# Patient Record
Sex: Female | Born: 1940 | Race: White | Hispanic: No | Marital: Married | Smoking: Former smoker
Health system: Southern US, Community
[De-identification: ages and names within clinical notes are randomized; demographics above are authoritative.]

## PROBLEM LIST (undated history)

## (undated) DIAGNOSIS — E119 Type 2 diabetes mellitus without complications: Secondary | ICD-10-CM

## (undated) DIAGNOSIS — I5020 Unspecified systolic (congestive) heart failure: Secondary | ICD-10-CM

## (undated) DIAGNOSIS — I10 Essential (primary) hypertension: Secondary | ICD-10-CM

## (undated) DIAGNOSIS — M8580 Other specified disorders of bone density and structure, unspecified site: Secondary | ICD-10-CM

## (undated) DIAGNOSIS — R569 Unspecified convulsions: Secondary | ICD-10-CM

## (undated) DIAGNOSIS — H5440 Blindness, one eye, unspecified eye: Secondary | ICD-10-CM

## (undated) DIAGNOSIS — B004 Herpesviral encephalitis: Secondary | ICD-10-CM

## (undated) DIAGNOSIS — H05829 Myopathy of extraocular muscles, unspecified orbit: Secondary | ICD-10-CM

## (undated) DIAGNOSIS — N3021 Other chronic cystitis with hematuria: Secondary | ICD-10-CM

## (undated) DIAGNOSIS — I4891 Unspecified atrial fibrillation: Secondary | ICD-10-CM

## (undated) DIAGNOSIS — S329XXA Fracture of unspecified parts of lumbosacral spine and pelvis, initial encounter for closed fracture: Secondary | ICD-10-CM

## (undated) DIAGNOSIS — H3500 Unspecified background retinopathy: Secondary | ICD-10-CM

## (undated) DIAGNOSIS — K219 Gastro-esophageal reflux disease without esophagitis: Secondary | ICD-10-CM

## (undated) DIAGNOSIS — N390 Urinary tract infection, site not specified: Secondary | ICD-10-CM

## (undated) DIAGNOSIS — E785 Hyperlipidemia, unspecified: Secondary | ICD-10-CM

## (undated) DIAGNOSIS — S72141A Displaced intertrochanteric fracture of right femur, initial encounter for closed fracture: Secondary | ICD-10-CM

## (undated) DIAGNOSIS — R413 Other amnesia: Secondary | ICD-10-CM

## (undated) DIAGNOSIS — N183 Chronic kidney disease, stage 3 (moderate): Secondary | ICD-10-CM

## (undated) DIAGNOSIS — J984 Other disorders of lung: Secondary | ICD-10-CM

## (undated) HISTORY — PX: SHOULDER SURGERY: SHX246

## (undated) HISTORY — DX: Other disorders of lung: J98.4

## (undated) HISTORY — DX: Myopathy of extraocular muscles, unspecified orbit: H05.829

## (undated) HISTORY — DX: Unspecified background retinopathy: H35.00

## (undated) HISTORY — DX: Herpesviral encephalitis: B00.4

## (undated) HISTORY — DX: Other chronic cystitis with hematuria: N30.21

## (undated) HISTORY — DX: Other specified disorders of bone density and structure, unspecified site: M85.80

## (undated) HISTORY — PX: FRACTURE SURGERY: SHX138

## (undated) HISTORY — DX: Urinary tract infection, site not specified: N39.0

## (undated) HISTORY — DX: Gastro-esophageal reflux disease without esophagitis: K21.9

## (undated) HISTORY — DX: Unspecified atrial fibrillation: I48.91

## (undated) HISTORY — DX: Hyperlipidemia, unspecified: E78.5

## (undated) HISTORY — DX: Essential (primary) hypertension: I10

## (undated) HISTORY — DX: Blindness, one eye, unspecified eye: H54.40

## (undated) HISTORY — DX: Type 2 diabetes mellitus without complications: E11.9

---

## 2002-03-02 DIAGNOSIS — M8580 Other specified disorders of bone density and structure, unspecified site: Secondary | ICD-10-CM

## 2002-03-02 HISTORY — DX: Other specified disorders of bone density and structure, unspecified site: M85.80

## 2003-02-08 ENCOUNTER — Encounter: Payer: Self-pay | Admitting: Internal Medicine

## 2004-01-02 ENCOUNTER — Ambulatory Visit: Payer: Self-pay | Admitting: Internal Medicine

## 2004-04-17 ENCOUNTER — Ambulatory Visit: Payer: Self-pay | Admitting: Internal Medicine

## 2004-04-21 ENCOUNTER — Ambulatory Visit: Payer: Self-pay | Admitting: Internal Medicine

## 2004-07-15 ENCOUNTER — Ambulatory Visit: Payer: Self-pay | Admitting: Internal Medicine

## 2004-10-08 ENCOUNTER — Encounter: Admission: RE | Admit: 2004-10-08 | Discharge: 2004-10-08 | Payer: Self-pay | Admitting: Internal Medicine

## 2004-10-08 ENCOUNTER — Ambulatory Visit: Payer: Self-pay | Admitting: Internal Medicine

## 2004-11-14 ENCOUNTER — Ambulatory Visit: Payer: Self-pay | Admitting: Internal Medicine

## 2005-02-24 ENCOUNTER — Ambulatory Visit: Payer: Self-pay | Admitting: Internal Medicine

## 2005-02-24 ENCOUNTER — Ambulatory Visit: Payer: Self-pay | Admitting: Cardiology

## 2005-02-24 ENCOUNTER — Emergency Department (HOSPITAL_COMMUNITY): Admission: EM | Admit: 2005-02-24 | Discharge: 2005-02-24 | Payer: Self-pay | Admitting: *Deleted

## 2005-02-25 ENCOUNTER — Ambulatory Visit: Payer: Self-pay | Admitting: Internal Medicine

## 2005-03-05 ENCOUNTER — Ambulatory Visit: Payer: Self-pay

## 2005-03-10 ENCOUNTER — Inpatient Hospital Stay (HOSPITAL_BASED_OUTPATIENT_CLINIC_OR_DEPARTMENT_OTHER): Admission: RE | Admit: 2005-03-10 | Discharge: 2005-03-10 | Payer: Self-pay | Admitting: Cardiology

## 2005-03-10 ENCOUNTER — Ambulatory Visit: Payer: Self-pay | Admitting: Cardiology

## 2005-03-16 ENCOUNTER — Ambulatory Visit: Payer: Self-pay | Admitting: Internal Medicine

## 2005-03-16 ENCOUNTER — Ambulatory Visit: Payer: Self-pay | Admitting: Cardiology

## 2005-03-16 ENCOUNTER — Inpatient Hospital Stay (HOSPITAL_COMMUNITY): Admission: EM | Admit: 2005-03-16 | Discharge: 2005-03-18 | Payer: Self-pay | Admitting: Emergency Medicine

## 2005-03-20 ENCOUNTER — Ambulatory Visit: Payer: Self-pay

## 2005-03-26 ENCOUNTER — Ambulatory Visit: Payer: Self-pay | Admitting: Cardiology

## 2005-03-26 ENCOUNTER — Ambulatory Visit: Payer: Self-pay | Admitting: Internal Medicine

## 2005-04-02 ENCOUNTER — Ambulatory Visit: Payer: Self-pay | Admitting: Cardiology

## 2005-04-09 ENCOUNTER — Ambulatory Visit: Payer: Self-pay | Admitting: *Deleted

## 2005-04-30 ENCOUNTER — Ambulatory Visit: Payer: Self-pay | Admitting: Internal Medicine

## 2005-05-04 ENCOUNTER — Ambulatory Visit: Payer: Self-pay | Admitting: Internal Medicine

## 2005-05-07 ENCOUNTER — Ambulatory Visit: Payer: Self-pay | Admitting: *Deleted

## 2005-05-07 ENCOUNTER — Inpatient Hospital Stay (HOSPITAL_COMMUNITY): Admission: EM | Admit: 2005-05-07 | Discharge: 2005-05-08 | Payer: Self-pay | Admitting: Emergency Medicine

## 2005-05-08 ENCOUNTER — Encounter: Payer: Self-pay | Admitting: Cardiology

## 2005-05-11 ENCOUNTER — Ambulatory Visit: Payer: Self-pay | Admitting: Internal Medicine

## 2005-05-11 ENCOUNTER — Ambulatory Visit (HOSPITAL_COMMUNITY): Admission: RE | Admit: 2005-05-11 | Discharge: 2005-05-12 | Payer: Self-pay | Admitting: Cardiology

## 2005-05-21 ENCOUNTER — Ambulatory Visit: Payer: Self-pay | Admitting: Cardiology

## 2005-06-04 ENCOUNTER — Ambulatory Visit: Payer: Self-pay

## 2005-06-22 ENCOUNTER — Ambulatory Visit: Payer: Self-pay | Admitting: Internal Medicine

## 2005-06-25 ENCOUNTER — Ambulatory Visit: Payer: Self-pay | Admitting: Internal Medicine

## 2005-08-04 ENCOUNTER — Ambulatory Visit: Payer: Self-pay | Admitting: Internal Medicine

## 2005-08-11 ENCOUNTER — Ambulatory Visit: Payer: Self-pay | Admitting: Cardiology

## 2005-08-26 ENCOUNTER — Ambulatory Visit: Payer: Self-pay | Admitting: Cardiology

## 2005-08-26 ENCOUNTER — Ambulatory Visit: Payer: Self-pay | Admitting: Internal Medicine

## 2005-09-07 ENCOUNTER — Ambulatory Visit: Payer: Self-pay | Admitting: Internal Medicine

## 2005-09-10 ENCOUNTER — Ambulatory Visit: Payer: Self-pay | Admitting: Cardiology

## 2005-09-14 ENCOUNTER — Ambulatory Visit: Payer: Self-pay | Admitting: Internal Medicine

## 2005-09-24 ENCOUNTER — Ambulatory Visit: Payer: Self-pay | Admitting: Internal Medicine

## 2005-10-08 ENCOUNTER — Ambulatory Visit: Payer: Self-pay | Admitting: *Deleted

## 2005-10-29 ENCOUNTER — Ambulatory Visit: Payer: Self-pay | Admitting: Cardiology

## 2005-11-26 ENCOUNTER — Ambulatory Visit: Payer: Self-pay | Admitting: Cardiology

## 2005-12-07 ENCOUNTER — Ambulatory Visit: Payer: Self-pay | Admitting: Internal Medicine

## 2005-12-24 ENCOUNTER — Ambulatory Visit: Payer: Self-pay | Admitting: Cardiology

## 2005-12-25 ENCOUNTER — Ambulatory Visit: Payer: Self-pay | Admitting: Internal Medicine

## 2006-01-20 ENCOUNTER — Ambulatory Visit: Payer: Self-pay | Admitting: Cardiology

## 2006-02-17 ENCOUNTER — Ambulatory Visit: Payer: Self-pay | Admitting: *Deleted

## 2006-03-27 DIAGNOSIS — E785 Hyperlipidemia, unspecified: Secondary | ICD-10-CM

## 2006-03-27 DIAGNOSIS — Z8679 Personal history of other diseases of the circulatory system: Secondary | ICD-10-CM

## 2006-03-27 DIAGNOSIS — G609 Hereditary and idiopathic neuropathy, unspecified: Secondary | ICD-10-CM | POA: Insufficient documentation

## 2006-03-27 DIAGNOSIS — I10 Essential (primary) hypertension: Secondary | ICD-10-CM

## 2006-03-27 DIAGNOSIS — M8580 Other specified disorders of bone density and structure, unspecified site: Secondary | ICD-10-CM

## 2006-03-27 DIAGNOSIS — E119 Type 2 diabetes mellitus without complications: Secondary | ICD-10-CM

## 2006-03-27 DIAGNOSIS — H3500 Unspecified background retinopathy: Secondary | ICD-10-CM | POA: Insufficient documentation

## 2006-03-27 DIAGNOSIS — I4892 Unspecified atrial flutter: Secondary | ICD-10-CM

## 2006-03-27 HISTORY — DX: Essential (primary) hypertension: I10

## 2006-03-27 HISTORY — DX: Unspecified background retinopathy: H35.00

## 2006-03-27 HISTORY — DX: Type 2 diabetes mellitus without complications: E11.9

## 2006-03-27 HISTORY — DX: Hyperlipidemia, unspecified: E78.5

## 2006-04-02 ENCOUNTER — Ambulatory Visit: Payer: Self-pay | Admitting: Cardiology

## 2006-04-21 ENCOUNTER — Ambulatory Visit: Payer: Self-pay | Admitting: Internal Medicine

## 2006-04-30 ENCOUNTER — Ambulatory Visit: Payer: Self-pay | Admitting: Internal Medicine

## 2006-05-03 ENCOUNTER — Ambulatory Visit: Payer: Self-pay | Admitting: Internal Medicine

## 2006-05-03 LAB — CONVERTED CEMR LAB
BUN: 18 mg/dL (ref 6–23)
CO2: 32 meq/L (ref 19–32)
GFR calc Af Amer: 58 mL/min
Potassium: 4.5 meq/L (ref 3.5–5.1)

## 2006-05-05 ENCOUNTER — Ambulatory Visit: Payer: Self-pay | Admitting: *Deleted

## 2006-05-10 ENCOUNTER — Emergency Department (HOSPITAL_COMMUNITY): Admission: EM | Admit: 2006-05-10 | Discharge: 2006-05-11 | Payer: Self-pay | Admitting: Emergency Medicine

## 2006-05-17 ENCOUNTER — Ambulatory Visit: Payer: Self-pay | Admitting: Cardiology

## 2006-05-24 ENCOUNTER — Ambulatory Visit: Payer: Self-pay | Admitting: Internal Medicine

## 2006-05-24 ENCOUNTER — Encounter: Payer: Self-pay | Admitting: Internal Medicine

## 2006-05-24 LAB — CONVERTED CEMR LAB
Creatinine,U: 129.3 mg/dL
Microalb, Ur: 10.5 mg/dL — ABNORMAL HIGH (ref 0.0–1.9)
Total CHOL/HDL Ratio: 2.8
VLDL: 20 mg/dL (ref 0–40)

## 2006-06-28 ENCOUNTER — Ambulatory Visit: Payer: Self-pay | Admitting: Cardiology

## 2006-06-29 ENCOUNTER — Ambulatory Visit: Payer: Self-pay | Admitting: Internal Medicine

## 2006-07-06 ENCOUNTER — Encounter: Payer: Self-pay | Admitting: Internal Medicine

## 2006-07-06 ENCOUNTER — Ambulatory Visit: Payer: Self-pay | Admitting: Thoracic Surgery

## 2006-07-19 ENCOUNTER — Ambulatory Visit: Payer: Self-pay | Admitting: Internal Medicine

## 2006-07-27 ENCOUNTER — Ambulatory Visit: Payer: Self-pay | Admitting: Thoracic Surgery

## 2006-07-27 ENCOUNTER — Encounter: Payer: Self-pay | Admitting: Thoracic Surgery

## 2006-07-27 ENCOUNTER — Ambulatory Visit: Payer: Self-pay | Admitting: Internal Medicine

## 2006-07-27 ENCOUNTER — Inpatient Hospital Stay (HOSPITAL_COMMUNITY): Admission: RE | Admit: 2006-07-27 | Discharge: 2006-08-03 | Payer: Self-pay | Admitting: Thoracic Surgery

## 2006-08-06 ENCOUNTER — Ambulatory Visit: Payer: Self-pay | Admitting: Cardiology

## 2006-08-10 ENCOUNTER — Ambulatory Visit: Payer: Self-pay | Admitting: Thoracic Surgery

## 2006-08-10 ENCOUNTER — Encounter: Admission: RE | Admit: 2006-08-10 | Discharge: 2006-08-10 | Payer: Self-pay | Admitting: Thoracic Surgery

## 2006-08-16 ENCOUNTER — Ambulatory Visit: Payer: Self-pay | Admitting: Cardiology

## 2006-08-31 ENCOUNTER — Encounter: Admission: RE | Admit: 2006-08-31 | Discharge: 2006-08-31 | Payer: Self-pay | Admitting: Thoracic Surgery

## 2006-08-31 ENCOUNTER — Ambulatory Visit: Payer: Self-pay | Admitting: Thoracic Surgery

## 2006-09-01 ENCOUNTER — Ambulatory Visit: Payer: Self-pay | Admitting: Internal Medicine

## 2006-09-01 DIAGNOSIS — J984 Other disorders of lung: Secondary | ICD-10-CM | POA: Insufficient documentation

## 2006-09-01 HISTORY — DX: Other disorders of lung: J98.4

## 2006-09-06 ENCOUNTER — Ambulatory Visit: Payer: Self-pay | Admitting: Cardiovascular Disease

## 2006-09-16 LAB — CONVERTED CEMR LAB
BUN: 16 mg/dL (ref 6–23)
CO2: 31 meq/L (ref 19–32)
GFR calc Af Amer: 58 mL/min
Microalb, Ur: 3.4 mg/dL — ABNORMAL HIGH (ref 0.0–1.9)
Potassium: 5.2 meq/L — ABNORMAL HIGH (ref 3.5–5.1)
Sodium: 140 meq/L (ref 135–145)

## 2006-09-21 ENCOUNTER — Ambulatory Visit: Payer: Self-pay | Admitting: Internal Medicine

## 2006-10-07 ENCOUNTER — Ambulatory Visit: Payer: Self-pay | Admitting: Endocrinology

## 2006-10-22 ENCOUNTER — Ambulatory Visit: Payer: Self-pay | Admitting: Cardiovascular Disease

## 2006-10-22 ENCOUNTER — Ambulatory Visit: Payer: Self-pay | Admitting: Endocrinology

## 2006-11-05 ENCOUNTER — Ambulatory Visit: Payer: Self-pay | Admitting: Internal Medicine

## 2006-11-26 ENCOUNTER — Ambulatory Visit: Payer: Self-pay | Admitting: Cardiovascular Disease

## 2006-12-01 ENCOUNTER — Encounter: Admission: RE | Admit: 2006-12-01 | Discharge: 2006-12-01 | Payer: Self-pay | Admitting: Thoracic Surgery

## 2006-12-01 ENCOUNTER — Ambulatory Visit: Payer: Self-pay | Admitting: Thoracic Surgery

## 2006-12-10 ENCOUNTER — Ambulatory Visit: Payer: Self-pay | Admitting: Cardiology

## 2006-12-31 ENCOUNTER — Ambulatory Visit: Payer: Self-pay | Admitting: Cardiology

## 2007-01-03 ENCOUNTER — Ambulatory Visit: Payer: Self-pay | Admitting: Internal Medicine

## 2007-01-05 LAB — CONVERTED CEMR LAB
Basophils Absolute: 0.1 10*3/uL (ref 0.0–0.1)
Creatinine,U: 63.9 mg/dL
Eosinophils Absolute: 0.2 10*3/uL (ref 0.0–0.6)
Eosinophils Relative: 2.1 % (ref 0.0–5.0)
GFR calc Af Amer: 64 mL/min
GFR calc non Af Amer: 53 mL/min
Glucose, Bld: 82 mg/dL (ref 70–99)
HCT: 37 % (ref 36.0–46.0)
Hemoglobin: 12.5 g/dL (ref 12.0–15.0)
Lymphocytes Relative: 27.4 % (ref 12.0–46.0)
MCHC: 33.8 g/dL (ref 30.0–36.0)
MCV: 91.4 fL (ref 78.0–100.0)
Monocytes Absolute: 0.8 10*3/uL — ABNORMAL HIGH (ref 0.2–0.7)
Neutro Abs: 4.9 10*3/uL (ref 1.4–7.7)
Neutrophils Relative %: 60.5 % (ref 43.0–77.0)
Potassium: 4.5 meq/L (ref 3.5–5.1)
RBC: 4.05 M/uL (ref 3.87–5.11)
Sodium: 140 meq/L (ref 135–145)

## 2007-01-26 ENCOUNTER — Ambulatory Visit: Payer: Self-pay | Admitting: Cardiology

## 2007-02-07 ENCOUNTER — Other Ambulatory Visit: Admission: RE | Admit: 2007-02-07 | Discharge: 2007-02-07 | Payer: Self-pay | Admitting: Gynecology

## 2007-02-07 ENCOUNTER — Encounter: Payer: Self-pay | Admitting: Internal Medicine

## 2007-02-07 LAB — CONVERTED CEMR LAB
TSH: 1.16 microintl units/mL
Vit D, 25-Hydroxy: 46 ng/mL

## 2007-02-09 ENCOUNTER — Encounter: Payer: Self-pay | Admitting: Internal Medicine

## 2007-02-14 ENCOUNTER — Ambulatory Visit: Payer: Self-pay | Admitting: Internal Medicine

## 2007-03-07 ENCOUNTER — Encounter: Payer: Self-pay | Admitting: Internal Medicine

## 2007-03-08 ENCOUNTER — Ambulatory Visit: Payer: Self-pay | Admitting: Internal Medicine

## 2007-03-09 ENCOUNTER — Ambulatory Visit: Payer: Self-pay | Admitting: Cardiovascular Disease

## 2007-03-31 ENCOUNTER — Ambulatory Visit: Payer: Self-pay | Admitting: Cardiology

## 2007-04-14 ENCOUNTER — Ambulatory Visit: Payer: Self-pay | Admitting: Cardiology

## 2007-05-04 ENCOUNTER — Ambulatory Visit: Payer: Self-pay | Admitting: Internal Medicine

## 2007-05-06 ENCOUNTER — Ambulatory Visit: Payer: Self-pay | Admitting: Cardiology

## 2007-05-16 ENCOUNTER — Ambulatory Visit: Payer: Self-pay | Admitting: Endocrinology

## 2007-05-27 ENCOUNTER — Ambulatory Visit: Payer: Self-pay | Admitting: Cardiology

## 2007-06-29 ENCOUNTER — Ambulatory Visit: Payer: Self-pay | Admitting: Cardiology

## 2007-06-29 ENCOUNTER — Ambulatory Visit: Payer: Self-pay | Admitting: Endocrinology

## 2007-07-08 ENCOUNTER — Ambulatory Visit: Payer: Self-pay | Admitting: Cardiology

## 2007-07-29 ENCOUNTER — Ambulatory Visit: Payer: Self-pay | Admitting: Cardiology

## 2007-08-10 ENCOUNTER — Emergency Department (HOSPITAL_COMMUNITY): Admission: EM | Admit: 2007-08-10 | Discharge: 2007-08-10 | Payer: Self-pay | Admitting: Emergency Medicine

## 2007-08-26 ENCOUNTER — Ambulatory Visit: Payer: Self-pay | Admitting: Cardiology

## 2007-08-29 ENCOUNTER — Ambulatory Visit: Payer: Self-pay | Admitting: Internal Medicine

## 2007-09-01 ENCOUNTER — Ambulatory Visit: Payer: Self-pay | Admitting: Internal Medicine

## 2007-09-05 ENCOUNTER — Encounter (INDEPENDENT_AMBULATORY_CARE_PROVIDER_SITE_OTHER): Payer: Self-pay | Admitting: *Deleted

## 2007-09-05 ENCOUNTER — Encounter: Payer: Self-pay | Admitting: Endocrinology

## 2007-09-05 LAB — CONVERTED CEMR LAB
AST: 20 units/L (ref 0–37)
BUN: 20 mg/dL (ref 6–23)
CO2: 30 meq/L (ref 19–32)
Calcium: 9.3 mg/dL (ref 8.4–10.5)
Chloride: 99 meq/L (ref 96–112)
Cholesterol: 132 mg/dL (ref 0–200)
Creatinine, Ser: 1 mg/dL (ref 0.4–1.2)
GFR calc non Af Amer: 59 mL/min
HDL: 37.7 mg/dL — ABNORMAL LOW (ref >39.0)
LDL Cholesterol: 77 mg/dL (ref 0–99)
Total CHOL/HDL Ratio: 3.5
Triglycerides: 88 mg/dL (ref 0–149)

## 2007-09-06 ENCOUNTER — Ambulatory Visit: Payer: Self-pay | Admitting: Endocrinology

## 2007-09-12 ENCOUNTER — Ambulatory Visit: Payer: Self-pay | Admitting: Cardiology

## 2007-10-07 ENCOUNTER — Ambulatory Visit: Payer: Self-pay | Admitting: Endocrinology

## 2007-10-10 ENCOUNTER — Ambulatory Visit: Payer: Self-pay | Admitting: Cardiology

## 2007-10-31 ENCOUNTER — Ambulatory Visit: Payer: Self-pay | Admitting: Cardiology

## 2007-11-01 ENCOUNTER — Ambulatory Visit: Payer: Self-pay | Admitting: Internal Medicine

## 2007-11-08 ENCOUNTER — Ambulatory Visit: Payer: Self-pay | Admitting: Internal Medicine

## 2007-11-08 ENCOUNTER — Encounter (INDEPENDENT_AMBULATORY_CARE_PROVIDER_SITE_OTHER): Payer: Self-pay | Admitting: *Deleted

## 2007-11-08 LAB — CONVERTED CEMR LAB
OCCULT 1: NEGATIVE
OCCULT 2: NEGATIVE

## 2007-11-14 ENCOUNTER — Ambulatory Visit: Payer: Self-pay | Admitting: Cardiology

## 2007-11-15 ENCOUNTER — Encounter: Payer: Self-pay | Admitting: Internal Medicine

## 2007-12-05 ENCOUNTER — Ambulatory Visit: Payer: Self-pay | Admitting: Cardiology

## 2008-01-02 ENCOUNTER — Ambulatory Visit: Payer: Self-pay | Admitting: Cardiovascular Disease

## 2008-01-18 ENCOUNTER — Ambulatory Visit: Payer: Self-pay | Admitting: Endocrinology

## 2008-01-18 LAB — CONVERTED CEMR LAB: Hgb A1c MFr Bld: 8.8 % — ABNORMAL HIGH (ref 4.6–6.0)

## 2008-02-09 ENCOUNTER — Ambulatory Visit: Payer: Self-pay | Admitting: Cardiology

## 2008-03-08 ENCOUNTER — Ambulatory Visit: Payer: Self-pay | Admitting: Cardiovascular Disease

## 2008-04-05 ENCOUNTER — Ambulatory Visit: Payer: Self-pay | Admitting: Internal Medicine

## 2008-05-03 ENCOUNTER — Ambulatory Visit: Payer: Self-pay | Admitting: Cardiovascular Disease

## 2008-05-15 ENCOUNTER — Ambulatory Visit: Payer: Self-pay | Admitting: Endocrinology

## 2008-05-28 ENCOUNTER — Ambulatory Visit: Payer: Self-pay | Admitting: Internal Medicine

## 2008-05-31 ENCOUNTER — Ambulatory Visit: Payer: Self-pay | Admitting: Cardiovascular Disease

## 2008-06-18 ENCOUNTER — Encounter: Payer: Self-pay | Admitting: Internal Medicine

## 2008-06-28 ENCOUNTER — Ambulatory Visit: Payer: Self-pay | Admitting: Internal Medicine

## 2008-07-26 ENCOUNTER — Ambulatory Visit: Payer: Self-pay | Admitting: Cardiovascular Disease

## 2008-07-26 LAB — CONVERTED CEMR LAB: Protime: 19

## 2008-07-31 ENCOUNTER — Encounter: Payer: Self-pay | Admitting: *Deleted

## 2008-08-27 ENCOUNTER — Ambulatory Visit: Payer: Self-pay | Admitting: Cardiology

## 2008-09-05 ENCOUNTER — Encounter: Payer: Self-pay | Admitting: *Deleted

## 2008-09-24 ENCOUNTER — Ambulatory Visit: Payer: Self-pay | Admitting: Cardiology

## 2008-10-01 ENCOUNTER — Ambulatory Visit: Payer: Self-pay | Admitting: Internal Medicine

## 2008-10-02 ENCOUNTER — Ambulatory Visit: Payer: Self-pay | Admitting: Endocrinology

## 2008-10-05 LAB — CONVERTED CEMR LAB
ALT: 14 units/L (ref 0–35)
AST: 19 units/L (ref 0–37)
BUN: 19 mg/dL (ref 6–23)
Basophils Absolute: 0 10*3/uL (ref 0.0–0.1)
Calcium: 9.3 mg/dL (ref 8.4–10.5)
Chloride: 103 meq/L (ref 96–112)
Cholesterol: 140 mg/dL (ref 0–200)
Creatinine, Ser: 1.2 mg/dL (ref 0.4–1.2)
Creatinine,U: 78.9 mg/dL
Eosinophils Absolute: 0.2 10*3/uL (ref 0.0–0.7)
GFR calc non Af Amer: 47.66 mL/min (ref >60)
HDL: 47.8 mg/dL (ref >39.00)
Hemoglobin: 12.8 g/dL (ref 12.0–15.0)
Hgb A1c MFr Bld: 9.2 % — ABNORMAL HIGH (ref 4.6–6.5)
LDL Cholesterol: 76 mg/dL (ref 0–99)
Lymphocytes Relative: 25.5 % (ref 12.0–46.0)
MCHC: 34 g/dL (ref 30.0–36.0)
Neutro Abs: 5.1 10*3/uL (ref 1.4–7.7)
Platelets: 264 10*3/uL (ref 150.0–400.0)
RDW: 12.1 % (ref 11.5–14.6)
Total CHOL/HDL Ratio: 3
Triglycerides: 79 mg/dL (ref 0.0–149.0)
VLDL: 15.8 mg/dL (ref 0.0–40.0)

## 2008-10-22 ENCOUNTER — Ambulatory Visit: Payer: Self-pay | Admitting: Cardiovascular Disease

## 2008-11-19 ENCOUNTER — Ambulatory Visit: Payer: Self-pay | Admitting: Cardiology

## 2008-11-19 LAB — CONVERTED CEMR LAB: POC INR: 3.2

## 2008-11-30 ENCOUNTER — Ambulatory Visit: Payer: Self-pay | Admitting: Internal Medicine

## 2008-12-03 ENCOUNTER — Ambulatory Visit: Payer: Self-pay | Admitting: Internal Medicine

## 2008-12-03 LAB — CONVERTED CEMR LAB: POC INR: 2.7

## 2008-12-25 ENCOUNTER — Ambulatory Visit: Payer: Self-pay | Admitting: Cardiovascular Disease

## 2009-01-21 ENCOUNTER — Ambulatory Visit: Payer: Self-pay | Admitting: Endocrinology

## 2009-01-21 LAB — CONVERTED CEMR LAB
Fructosamine: 350 micromoles/L — ABNORMAL HIGH (ref <285)
Hgb A1c MFr Bld: 9.6 % — ABNORMAL HIGH (ref 4.6–6.5)

## 2009-01-22 ENCOUNTER — Ambulatory Visit: Payer: Self-pay | Admitting: Cardiovascular Disease

## 2009-02-04 ENCOUNTER — Ambulatory Visit: Payer: Self-pay | Admitting: Internal Medicine

## 2009-02-06 ENCOUNTER — Encounter (INDEPENDENT_AMBULATORY_CARE_PROVIDER_SITE_OTHER): Payer: Self-pay | Admitting: *Deleted

## 2009-02-12 ENCOUNTER — Ambulatory Visit: Payer: Self-pay | Admitting: Cardiology

## 2009-03-14 ENCOUNTER — Ambulatory Visit: Payer: Self-pay | Admitting: Internal Medicine

## 2009-04-11 ENCOUNTER — Ambulatory Visit: Payer: Self-pay | Admitting: Cardiology

## 2009-04-11 LAB — CONVERTED CEMR LAB: POC INR: 4.3

## 2009-04-22 ENCOUNTER — Ambulatory Visit: Payer: Self-pay | Admitting: Internal Medicine

## 2009-04-22 ENCOUNTER — Ambulatory Visit: Payer: Self-pay | Admitting: Endocrinology

## 2009-04-22 ENCOUNTER — Encounter (INDEPENDENT_AMBULATORY_CARE_PROVIDER_SITE_OTHER): Payer: Self-pay | Admitting: *Deleted

## 2009-04-22 LAB — CONVERTED CEMR LAB: Fecal Occult Bld: NEGATIVE

## 2009-04-25 ENCOUNTER — Ambulatory Visit: Payer: Self-pay | Admitting: Cardiology

## 2009-04-25 LAB — CONVERTED CEMR LAB: POC INR: 3.2

## 2009-05-07 ENCOUNTER — Ambulatory Visit: Payer: Self-pay | Admitting: Internal Medicine

## 2009-05-07 ENCOUNTER — Ambulatory Visit: Payer: Self-pay | Admitting: Cardiology

## 2009-05-07 DIAGNOSIS — R51 Headache: Secondary | ICD-10-CM

## 2009-05-07 DIAGNOSIS — R519 Headache, unspecified: Secondary | ICD-10-CM | POA: Insufficient documentation

## 2009-05-08 ENCOUNTER — Ambulatory Visit: Payer: Self-pay | Admitting: Cardiovascular Disease

## 2009-05-08 LAB — CONVERTED CEMR LAB: POC INR: 2.2

## 2009-05-10 ENCOUNTER — Telehealth (INDEPENDENT_AMBULATORY_CARE_PROVIDER_SITE_OTHER): Payer: Self-pay | Admitting: *Deleted

## 2009-05-29 ENCOUNTER — Ambulatory Visit: Payer: Self-pay | Admitting: Cardiovascular Disease

## 2009-06-19 ENCOUNTER — Ambulatory Visit: Payer: Self-pay | Admitting: Cardiology

## 2009-07-01 ENCOUNTER — Ambulatory Visit: Payer: Self-pay | Admitting: Internal Medicine

## 2009-07-05 LAB — CONVERTED CEMR LAB
Calcium: 9.2 mg/dL (ref 8.4–10.5)
Creatinine, Ser: 1.2 mg/dL (ref 0.4–1.2)
GFR calc non Af Amer: 47.55 mL/min (ref >60)
Glucose, Bld: 154 mg/dL — ABNORMAL HIGH (ref 70–99)
Sodium: 138 meq/L (ref 135–145)

## 2009-07-09 ENCOUNTER — Ambulatory Visit: Payer: Self-pay | Admitting: Cardiovascular Disease

## 2009-07-09 LAB — CONVERTED CEMR LAB: POC INR: 2.3

## 2009-07-26 ENCOUNTER — Ambulatory Visit: Payer: Self-pay | Admitting: Endocrinology

## 2009-07-30 ENCOUNTER — Ambulatory Visit: Payer: Self-pay | Admitting: Internal Medicine

## 2009-08-27 ENCOUNTER — Ambulatory Visit: Payer: Self-pay | Admitting: Internal Medicine

## 2009-08-27 LAB — CONVERTED CEMR LAB: POC INR: 2.1

## 2009-09-25 ENCOUNTER — Ambulatory Visit: Payer: Self-pay | Admitting: Cardiology

## 2009-09-25 LAB — CONVERTED CEMR LAB: POC INR: 2.2

## 2009-10-08 ENCOUNTER — Ambulatory Visit: Payer: Self-pay | Admitting: Endocrinology

## 2009-10-08 LAB — CONVERTED CEMR LAB: Hgb A1c MFr Bld: 8.8 % — ABNORMAL HIGH (ref 4.6–6.5)

## 2009-10-09 ENCOUNTER — Telehealth: Payer: Self-pay | Admitting: Internal Medicine

## 2009-10-23 ENCOUNTER — Ambulatory Visit: Payer: Self-pay | Admitting: Internal Medicine

## 2009-10-23 LAB — CONVERTED CEMR LAB: POC INR: 2.1

## 2009-11-20 ENCOUNTER — Ambulatory Visit: Payer: Self-pay | Admitting: Cardiology

## 2009-11-20 LAB — CONVERTED CEMR LAB: POC INR: 2.6

## 2009-12-18 ENCOUNTER — Ambulatory Visit: Payer: Self-pay | Admitting: Internal Medicine

## 2009-12-18 LAB — CONVERTED CEMR LAB: POC INR: 2.6

## 2010-01-10 ENCOUNTER — Ambulatory Visit: Payer: Self-pay | Admitting: Cardiology

## 2010-01-30 ENCOUNTER — Ambulatory Visit: Payer: Self-pay | Admitting: Endocrinology

## 2010-01-30 LAB — CONVERTED CEMR LAB: Microalb, Ur: 8.1 mg/dL — ABNORMAL HIGH (ref 0.0–1.9)

## 2010-02-03 ENCOUNTER — Ambulatory Visit: Payer: Self-pay | Admitting: Internal Medicine

## 2010-02-04 ENCOUNTER — Telehealth: Payer: Self-pay | Admitting: Internal Medicine

## 2010-02-05 ENCOUNTER — Encounter (INDEPENDENT_AMBULATORY_CARE_PROVIDER_SITE_OTHER): Payer: Self-pay | Admitting: *Deleted

## 2010-02-07 ENCOUNTER — Ambulatory Visit: Payer: Self-pay | Admitting: Internal Medicine

## 2010-02-07 LAB — CONVERTED CEMR LAB
BUN: 15 mg/dL (ref 6–23)
CO2: 28 meq/L (ref 19–32)
Chloride: 100 meq/L (ref 96–112)
Cholesterol: 162 mg/dL (ref 0–200)
Glucose, Bld: 140 mg/dL — ABNORMAL HIGH (ref 70–99)
HDL: 46.7 mg/dL (ref >39.00)
LDL Cholesterol: 99 mg/dL (ref 0–99)
Potassium: 4.4 meq/L (ref 3.5–5.1)
Sodium: 137 meq/L (ref 135–145)
Total CHOL/HDL Ratio: 3
Triglycerides: 83 mg/dL (ref 0.0–149.0)

## 2010-02-13 ENCOUNTER — Encounter
Admission: RE | Admit: 2010-02-13 | Discharge: 2010-02-13 | Payer: Self-pay | Source: Home / Self Care | Attending: Internal Medicine | Admitting: Internal Medicine

## 2010-02-17 ENCOUNTER — Ambulatory Visit: Payer: Self-pay | Admitting: Internal Medicine

## 2010-03-07 ENCOUNTER — Encounter: Payer: Self-pay | Admitting: Internal Medicine

## 2010-03-07 ENCOUNTER — Ambulatory Visit: Admission: RE | Admit: 2010-03-07 | Discharge: 2010-03-07 | Payer: Self-pay | Source: Home / Self Care

## 2010-03-07 ENCOUNTER — Ambulatory Visit
Admission: RE | Admit: 2010-03-07 | Discharge: 2010-03-07 | Payer: Self-pay | Source: Home / Self Care | Attending: Internal Medicine | Admitting: Internal Medicine

## 2010-03-07 DIAGNOSIS — I4891 Unspecified atrial fibrillation: Secondary | ICD-10-CM | POA: Insufficient documentation

## 2010-03-07 LAB — CONVERTED CEMR LAB: POC INR: 1.7

## 2010-03-30 LAB — CONVERTED CEMR LAB
TSH: 1.04 microintl units/mL (ref 0.35–5.50)
Vit D, 25-Hydroxy: 47 ng/mL (ref 30–89)

## 2010-04-01 NOTE — Assessment & Plan Note (Signed)
Summary: FU--PER PT--D/T---STC   Vital Signs:  Patient profile:   70 year old female Height:      69 inches (175.26 cm) Weight:      234 pounds (106.36 kg) BMI:     34.68 O2 Sat:      95 % on Room air Temp:     97.7 degrees F (36.50 degrees C) oral Pulse rate:   68 / minute BP sitting:   140 / 72  (left arm) Cuff size:   large  Vitals Entered By: Rebeca Alert CMA Deborra Medina) (January 30, 2010 1:04 PM)  O2 Flow:  Room air CC: Follow-up visit/refill on insulin/aj Is Patient Diabetic? Yes   CC:  Follow-up visit/refill on insulin/aj.  History of Present Illness: pt states she feels well in general.  no cbg record, but states cbg was slightly low after lunch, or at 2 am.  she says it is otherwise well-controlled, with no trend throughout the day.    Current Medications (verified): 1)  Avapro 150 Mg  Tabs (Irbesartan) .[name].. 1 By Mouth Once Daily 2)  Diltiazem Hcl 120 Mg  Tabs (Diltiazem Hcl) .[name].. 1 By Mouth Qd 3)  Asa 4)  Mvi 5)  Oscal 500/200 D-3   Tabs (Calcium-Vitamin D Tabs) 6)  Zocor 40 Mg Tabs (Simvastatin) .[name].. 1 By Mouth Daily. 7)  Coumadin 5 Mg Tabs (Warfarin Sodium) .... Take As Directed By Coumadin Clinic. 8)  Vicodin 5-500 Mg Tabs (Hydrocodone-Acetaminophen) .[name].. 1-2 By Mouth Every 6 Hours As Needed Ha 9)  Humulin N 100 Unit/ml Susp (Insulin Isophane Human) .... 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 of COMMON MIGRAINE    Review of Systems  The patient denies syncope.    Physical Exam  General:  obese.  no distress  Pulses:  dorsalis pedis intact bilat.    Extremities:  no deformity.  no ulcer on the feet.  feet are  of normal color and temp.  no edema.    Neurologic:  sensation is intact to touch on the feet.    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    Other Orders: TLB-A1C / Hgb A1C (Glycohemoglobin) (83036-A1C) TLB-Microalbumin/Creat Ratio, Urine (82043-MALB) Est. Patient Level III [id number]  Patient Instructions: 1)  a blood test is being ordered for you today.  a few days after the test(s), please call [phone] to hear your test results.  2)  pending the test results, please continue the same insulin for now 3)  Please schedule a follow-up appointment in 3 months. 4)  . Prescriptions: HUMULIN N 100 UNIT/ML SUSP (INSULIN ISOPHANE HUMAN) 50 units am and 25 units pm  #3 vials x 11   Entered and Authorized by:   Donavan Foil MD   Signed by:   Donavan Foil MD on 01/30/2010   Method used:   Electronically to        [facility] (retail)       [phone]       [facility]  [place]  [address]       Ph: [phone]       Fax: [phone]   [id number]    Orders Added: 1)  TLB-A1C / Hgb A1C (Glycohemoglobin) [83036-A1C] 2)  TLB-Microalbumin/Creat Ratio, Urine [82043-MALB] 3)  Est. Patient Level III [id number]

## 2010-04-01 NOTE — Medication Information (Signed)
Summary: rov/tm  Anticoagulant Therapy  Managed by: Tula Nakayama, RN, BSN Referring MD: Dorris Carnes Supervising MD: Percival Spanish MD, Jeneen Rinks Indication 1: Atrial Fibrillation (ICD-427.31) Lab Used: [facility] Site: [facility] INR POC 3.2 INR RANGE 2 - 3  Dietary changes: no    Health status changes: no    Bleeding/hemorrhagic complications: no    Recent/future hospitalizations: no    Any changes in medication regimen? no    Recent/future dental: no  Any missed doses?: no       Is patient compliant with meds? yes       Allergies: 1)  * Novacaine  Anticoagulation Management History:      The patient is taking warfarin and comes in today for a routine follow up visit.  Positive risk factors for bleeding include an age of 35 years or older and presence of serious comorbidities.  The bleeding index is 'intermediate risk'.  Positive CHADS2 values include History of HTN and History of Diabetes.  Negative CHADS2 values include Age > 33 years old.  The start date was 03/07/2005.  Her last INR was 3.3.  Anticoagulation responsible provider: Percival Spanish MD, Jeneen Rinks.  INR POC: 3.2.  Cuvette Lot#: [id number].  Exp: 06/2010.    Anticoagulation Management Assessment/Plan:      The patient's current anticoagulation dose is Coumadin 5 mg tabs: Take as directed by coumadin clinic..  The target INR is 2 - 3.  The next INR is due 05/08/2009.  Anticoagulation instructions were given to patient.  Results were reviewed/authorized by Tula Nakayama, RN, BSN.  She was notified by Tula Nakayama, RN, BSN.         Prior Anticoagulation Instructions: INR 4.3 Skip today's dose then change dose to 1/2 pill everyday except 1 pill on Mondays and Wednesdays. Recheck in 2 weeks.   Current Anticoagulation Instructions: INR 3.2 Skip today then change dose to 2.5mg s daily except 5mg s on Mondays. Recheck in 2 weeks.

## 2010-04-01 NOTE — Medication Information (Signed)
Summary: rov/cb  Anticoagulant Therapy  Managed by: Tula Nakayama, RN, BSN Referring MD: Dorris Carnes Supervising MD: Verl Blalock MD, Marcello Moores Indication 1: Atrial Fibrillation (ICD-427.31) Lab Used: [facility] Site: [facility] INR POC 2.2 INR RANGE 2 - 3  Dietary changes: no    Health status changes: no    Bleeding/hemorrhagic complications: no    Recent/future hospitalizations: no    Any changes in medication regimen? no    Recent/future dental: no  Any missed doses?: no       Is patient compliant with meds? yes       Allergies: 1)  * Novacaine  Anticoagulation Management History:      The patient is taking warfarin and comes in today for a routine follow up visit.  Positive risk factors for bleeding include an age of 39 years or older and presence of serious comorbidities.  The bleeding index is 'intermediate risk'.  Positive CHADS2 values include History of HTN and History of Diabetes.  Negative CHADS2 values include Age > 3 years old.  The start date was 03/07/2005.  Her last INR was 3.3.  Anticoagulation responsible provider: Verl Blalock MD, Marcello Moores.  INR POC: 2.2.  Cuvette Lot#: [id number].  Exp: 12/2010.    Anticoagulation Management Assessment/Plan:      The patient's current anticoagulation dose is Coumadin 5 mg tabs: Take as directed by coumadin clinic..  The target INR is 2 - 3.  The next INR is due 10/23/2009.  Anticoagulation instructions were given to patient.  Results were reviewed/authorized by Tula Nakayama, RN, BSN.  She was notified by Tula Nakayama, RN, BSN.         Prior Anticoagulation Instructions: INR 2.1. Take 0.5 tablet daily except 1 tablet on Mon, Wed, Fri. Recheck in   Current Anticoagulation Instructions: INR 2.2 Continue 2.5mg s daily except 5mg s on Mondays, Wednesdays and Fridays. Recheck in 4 weeks.

## 2010-04-01 NOTE — Letter (Signed)
Summary: Imaging Options Form/[facility]  Imaging Options Form/[facility] [place]   Imported By: Edmonia James 05/13/2009 08:00:44  _____________________________________________________________________  External Attachment:    Type:   Image     Comment:   External Document

## 2010-04-01 NOTE — Assessment & Plan Note (Signed)
Summary: 3 MTH FU  STC   Vital Signs:  Patient profile:   70 year old female Height:      69 inches (175.26 cm) Weight:      227.25 pounds (103.30 kg) O2 Sat:      97 % on Room air Temp:     97.1 degrees F (36.17 degrees C) oral Pulse rate:   65 / minute BP sitting:   138 / 68  (left arm) Cuff size:   large  Vitals Entered By: Gardenia Phlegm RMA (April 22, 2009 10:12 AM)  O2 Flow:  Room air CC: 3 month follow up/ CF Is Patient Diabetic? Yes   CC:  3 month follow up/ CF.  History of Present Illness: no cbg record, but states cbg's are somtimes low in the afternoon, highest in am (mid-100's).  pt states she feels well in general.    Current Medications (verified): 1)  Avapro 150 Mg  Tabs (Irbesartan) .[name].. 1 By Mouth Once Daily 2)  Diltiazem Hcl 120 Mg  Tabs (Diltiazem Hcl) .[name].. 1 By Mouth Qd 3)  Asa 4)  Mvi 5)  Oscal 500/200 D-3   Tabs (Calcium-Vitamin D Tabs) 6)  Simvastatin 80 Mg  Tabs (Simvastatin) .[name].. 1 By Mouth At Bedtime 7)  Coumadin 5 Mg Tabs (Warfarin Sodium) .... Take As Directed By Coumadin Clinic. 8)  Humulin 70/30 70-30 % Susp (Insulin Isophane & Regular) .... 48 Units Am and 18 Units Pm  Allergies (verified): 1)  * Novacaine  Past History:  Past Medical History: Last updated: 02/04/2009 DIABETES MELLITUS, w/ NEUROPATHY  RETINOPATHY from laser surgery  HYPERLIPIDEMIA  HYPERTENSION   ATRIAL FLUTTER, PAROXYSMAL  used to see Dr Lovena Le  LUNG NODULE EXCISION 2008, bX BENIGN  OSTEOPENIA?, DEXA 2004 showed osteopenia, DEXA  03-2007 normal ? of COMMON MIGRAINE    Review of Systems  The patient denies syncope.    Physical Exam  General:  obese.  no distress  Neck:  Supple without thyroid enlargement or tenderness. No cervical lymphadenopathy Additional Exam:  Hemoglobin A1C       [H]  8.9 %    Impression & Recommendations:  Problem # 1:  DIABETES MELLITUS, TYPE II (ICD-250.00) needs increased rx. therapy limited by pt's request for least  expensive meds  Medications Added to Medication List This Visit: 1)  Humulin 70/30 70-30 % Susp (Insulin isophane & regular) .... 46 units am and 20 units pm  Other Orders: TLB-A1C / Hgb A1C (Glycohemoglobin) (83036-A1C) Est. Patient Level III [id number]  Patient Instructions: 1)  pending the test results, change humulin 70/30, to 46 units am and 20 units pm. 2)  tests are being ordered for you today.  a few days after the test(s), please call [phone] to hear your test results.  3)  Please schedule a follow-up appointment in 3 months. 4)  (update: i left message on phone-tree:  rx as we discussed.  ret in 1 month, not 3)

## 2010-04-01 NOTE — Progress Notes (Signed)
Summary: decrease simvastatin  Phone Note Outgoing Call   Summary of Call: -I go to a flag from the Coumadin clinic alerting me  about the combination of verapamil and  high dose of simvastatin. they recommend a simvastatin dose of 10 mg -she has been on such combination for a while and she definitely needs statins - plan: call patient please decrease the dose of simvastatin to half , 40 mg daily Phuc Kluttz E. Donell Sliwinski MD  October 09, 2009 9:43 AM   Follow-up for Phone Call        I spoke with pt and she is aware of medication dosage change. Lang[place]  October 09, 2009 9:54 AM     New/Updated Medications: ZOCOR 40 MG TABS (SIMVASTATIN) 1 by mouth daily.

## 2010-04-01 NOTE — Medication Information (Signed)
Summary: rov/sp  Anticoagulant Therapy  Managed by: Porfirio Oar, PharmD Referring MD: Dorris Carnes Supervising MD: Olevia Perches MD, Bruce Indication 1: Atrial Fibrillation (ICD-427.31) Lab Used: [facility] Site: [facility] INR POC 2.6 INR RANGE 2 - 3  Dietary changes: no    Health status changes: no    Bleeding/hemorrhagic complications: no    Recent/future hospitalizations: no    Any changes in medication regimen? yes       Details: taking extra aleve for hip pain  Recent/future dental: no  Any missed doses?: no       Is patient compliant with meds? yes       Allergies: 1)  * Novacaine  Anticoagulation Management History:      The patient is taking warfarin and comes in today for a routine follow up visit.  Positive risk factors for bleeding include an age of 70 years or older and presence of serious comorbidities.  The bleeding index is 'intermediate risk'.  Positive CHADS2 values include History of HTN and History of Diabetes.  Negative CHADS2 values include Age > 41 years old.  The start date was 03/07/2005.  Her last INR was 3.3.  Anticoagulation responsible provider: Olevia Perches MD, Darnell Level.  INR POC: 2.6.  Cuvette Lot#: [id number].  Exp: 01/2011.    Anticoagulation Management Assessment/Plan:      The patient's current anticoagulation dose is Coumadin 5 mg tabs: Take as directed by coumadin clinic..  The target INR is 2 - 3.  The next INR is due 12/18/2009.  Anticoagulation instructions were given to patient.  Results were reviewed/authorized by Porfirio Oar, PharmD.  She was notified by Porfirio Oar PharmD.         Prior Anticoagulation Instructions: INR 2.1  Continue same dose of 1/2 tablet every day except 1 tablet on Monday, Wednesday and Friday.  Recheck INR in 4 weeks.   Current Anticoagulation Instructions: INR 2.6  Continue same dose of 1/2 tablet every day except 1 tablet on Monday, Wednesday and Friday.  Recheck INR in 4 weeks.

## 2010-04-01 NOTE — Medication Information (Signed)
Summary: rov/sl   Anticoagulant Therapy  Managed by: Porfirio Oar, PharmD Referring MD: Dorris Carnes Supervising MD: Rayann Heman MD, Jeneen Rinks Indication 1: Atrial Fibrillation (ICD-427.31) Lab Used: [place] Site: [facility] INR POC 2.5 INR RANGE 2 - 3  Dietary changes: no    Health status changes: no    Bleeding/hemorrhagic complications: no    Recent/future hospitalizations: no    Any changes in medication regimen? yes       Details: changed from simvastatin to pravachol  Recent/future dental: no  Any missed doses?: no       Is patient compliant with meds? yes       Allergies: 1)  * Novacaine  Anticoagulation Management History:      The patient is taking warfarin and comes in today for a routine follow up visit.  Positive risk factors for bleeding include an age of 3 years or older and presence of serious comorbidities.  The bleeding index is 'intermediate risk'.  Positive CHADS2 values include History of HTN and History of Diabetes.  Negative CHADS2 values include Age > 63 years old.  The start date was 03/07/2005.  Her last INR was 3.3.  Anticoagulation responsible provider: Allred MD, Jeneen Rinks.  INR POC: 2.5.  Cuvette Lot#: [id number].  Exp: 04/2011.    Anticoagulation Management Assessment/Plan:      The patient's current anticoagulation dose is Coumadin 5 mg tabs: Take as directed by coumadin clinic..  The target INR is 2 - 3.  The next INR is due 03/07/2010.  Anticoagulation instructions were given to patient.  Results were reviewed/authorized by Porfirio Oar, PharmD.  She was notified by Porfirio Oar PharmD.         Prior Anticoagulation Instructions: INR 3.1  Today, Friday, November 11th, take Coumadin 0.5 tab (2.5 mg). Then, resume taking Coumadin 0.5 tab (2.5 mg) on Sun, Tues, Thur, Sat and Coumadin 1 tab (5 mg) on Mon, Wed, Fri. Return to clinic in 4 weeks.   Current Anticoagulation Instructions: INR 2.5  Continue same dose of 1/2 tablet every day except 1 tablet on  Monday, Wednesday and Friday.  Recheck INR in 4 weeks.

## 2010-04-01 NOTE — Medication Information (Signed)
Summary: rov/ewj  Anticoagulant Therapy  Managed by: Porfirio Oar, PharmD Referring MD: Dorris Carnes Supervising MD: Aundra Dubin MD, Kirk Ruths Indication 1: Atrial Fibrillation (ICD-427.31) Lab Used: [place] Site: [facility] INR POC 1.9 INR RANGE 2 - 3  Dietary changes: no    Health status changes: no    Bleeding/hemorrhagic complications: no    Recent/future hospitalizations: no    Any changes in medication regimen? no    Recent/future dental: no  Any missed doses?: no       Is patient compliant with meds? yes       Allergies: 1)  * Novacaine  Anticoagulation Management History:      Positive risk factors for bleeding include an age of 10 years or older and presence of serious comorbidities.  The bleeding index is 'intermediate risk'.  Positive CHADS2 values include History of HTN and History of Diabetes.  Negative CHADS2 values include Age > 52 years old.  The start date was 03/07/2005.  Her last INR was 3.3.  Anticoagulation responsible provider: Aundra Dubin MD, Yoshua Geisinger.  INR POC: 1.9.  Exp: 07/2010.    Anticoagulation Management Assessment/Plan:      The patient's current anticoagulation dose is Coumadin 5 mg tabs: Take as directed by coumadin clinic..  The target INR is 2 - 3.  The next INR is due 07/09/2009.  Anticoagulation instructions were given to patient.  Results were reviewed/authorized by Porfirio Oar, PharmD.  She was notified by Porfirio Oar PharmD.         Prior Anticoagulation Instructions: INR 1.9  Start taking 1 tablet on Mondays and Fridays and 1/2 tablet all other days.  Recheck in 3 weeks.    Current Anticoagulation Instructions: INR 1.9  Increase dose to 1/2 tablet every day except 1 tablet on Monday, Wednesday and Friday

## 2010-04-01 NOTE — Medication Information (Signed)
Summary: rov/tm  Anticoagulant Therapy  Managed by: Porfirio Oar, PharmD Referring MD: Dorris Carnes Supervising MD: Rayann Heman MD, Jeneen Rinks Indication 1: Atrial Fibrillation (ICD-427.31) Lab Used: [facility] Site: [facility] INR POC 2.1 INR RANGE 2 - 3  Dietary changes: no    Health status changes: no    Bleeding/hemorrhagic complications: no    Recent/future hospitalizations: no    Any changes in medication regimen? no    Recent/future dental: no  Any missed doses?: no       Is patient compliant with meds? yes       Allergies: 1)  * Novacaine  Anticoagulation Management History:      The patient is taking warfarin and comes in today for a routine follow up visit.  Positive risk factors for bleeding include an age of 26 years or older and presence of serious comorbidities.  The bleeding index is 'intermediate risk'.  Positive CHADS2 values include History of HTN and History of Diabetes.  Negative CHADS2 values include Age > 106 years old.  The start date was 03/07/2005.  Her last INR was 3.3.  Anticoagulation responsible Arjan Strohm: Allred MD, Jeneen Rinks.  INR POC: 2.1.  Cuvette Lot#: [id number].  Exp: 12/2010.    Anticoagulation Management Assessment/Plan:      The patient's current anticoagulation dose is Coumadin 5 mg tabs: Take as directed by coumadin clinic..  The target INR is 2 - 3.  The next INR is due 11/20/2009.  Anticoagulation instructions were given to patient.  Results were reviewed/authorized by Porfirio Oar, PharmD.  She was notified by Porfirio Oar PharmD.         Prior Anticoagulation Instructions: INR 2.2 Continue 2.5mg s daily except 5mg s on Mondays, Wednesdays and Fridays. Recheck in 4 weeks.   Current Anticoagulation Instructions: INR 2.1  Continue same dose of 1/2 tablet every day except 1 tablet on Monday, Wednesday and Friday.  Recheck INR in 4 weeks.

## 2010-04-01 NOTE — Medication Information (Signed)
Summary: rov/ewj  Anticoagulant Therapy  Managed by: Freddrick March, RN, BSN Referring MD: Dorris Carnes Supervising MD: Johnsie Cancel MD, Collier Salina Indication 1: Atrial Fibrillation (ICD-427.31) Lab Used: [facility] Site: [facility] INR POC 1.9 INR RANGE 2 - 3  Dietary changes: no    Health status changes: no    Bleeding/hemorrhagic complications: no    Recent/future hospitalizations: no    Any changes in medication regimen? no    Recent/future dental: no  Any missed doses?: no       Is patient compliant with meds? yes       Allergies: 1)  * Novacaine  Anticoagulation Management History:      The patient is taking warfarin and comes in today for a routine follow up visit.  Positive risk factors for bleeding include an age of 70 years or older and presence of serious comorbidities.  The bleeding index is 'intermediate risk'.  Positive CHADS2 values include History of HTN and History of Diabetes.  Negative CHADS2 values include Age > 29 years old.  The start date was 03/07/2005.  Her last INR was 3.3.  Anticoagulation responsible provider: Johnsie Cancel MD, Collier Salina.  INR POC: 1.9.  Cuvette Lot#: [id number].  Exp: 07/2010.    Anticoagulation Management Assessment/Plan:      The patient's current anticoagulation dose is Coumadin 5 mg tabs: Take as directed by coumadin clinic..  The target INR is 2 - 3.  The next INR is due 06/19/2009.  Anticoagulation instructions were given to patient.  Results were reviewed/authorized by Freddrick March, RN, BSN.  She was notified by Freddrick March RN.         Prior Anticoagulation Instructions: INR 2.2  Continue on same dosage 1/2 tablet daily except 1 tablet on Mondays.  Recheck in 3 weeks.    Current Anticoagulation Instructions: INR 1.9  Start taking 1 tablet on Mondays and Fridays and 1/2 tablet all other days.  Recheck in 3 weeks.

## 2010-04-01 NOTE — Assessment & Plan Note (Signed)
Summary: followup/kn   Vital Signs:  Patient profile:   70 year old female Weight:      233.13 pounds Pulse rate:   67 / minute Pulse rhythm:   regular BP sitting:   136 / 72  (left arm) Cuff size:   large  Vitals Entered By: [name] CMA (February 03, 2010 9:55 AM) CC: Follow up visit- fasting  Comments declines flu shot  refill vicodin print rx's.    History of Present Illness: Here for Medicare AWV:  1.   Risk factors based on Past M, S, F history: REVIEWED  2.   Physical Activities: active at home, walks daily  ~ 15 to 20 min 3.   Depression/mood: denies problems and no problems noted  4.   Hearing: normal. no problems noted at normal conversation  5.   ADL's: totally independent  6.   Fall Risk: low risk, no recent fall  7.   Home Safety: DOES FEEL SAFE AT HOME  8.   Height, weight, &visual acuity: see VS, vision "not good" from previous laser and cataract                           suergery BUT stable per last specialist eval  9.   Counseling: yes  10.   Labs ordered based on risk factors: YES  11.           Referral Coordination, if needed  12.           Care Plan, see a/p  13.            Cognitive Assessment -- motor skills, memory and cognition seem adequate  in addition, we discussed the following DIABETES-- per Dr Melodye Ped -- on zocor, no myalgias per se  HYPERTENSION  -- ambulatory BPs varies , mostly WNL  ATRIAL FLUTTER, PAROXYSMAL  -- still on coumadin  , does not see cards anymore     Current Medications (verified): 1)  Avapro 150 Mg  Tabs (Irbesartan) .[name].. 1 By Mouth Once Daily 2)  Diltiazem Hcl 120 Mg  Tabs (Diltiazem Hcl) .[name].. 1 By Mouth Qd 3)  Asa 4)  Mvi 5)  Oscal 500/200 D-3   Tabs (Calcium-Vitamin D Tabs) 6)  Zocor 40 Mg Tabs (Simvastatin) .[name].. 1 By Mouth Daily. 7)  Coumadin 5 Mg Tabs (Warfarin Sodium) .... Take As Directed By Coumadin Clinic. 8)  Vicodin 5-500 Mg Tabs (Hydrocodone-Acetaminophen) .[name].. 1-2 By Mouth Every 6  Hours As Needed Ha 9)  Humulin N 100 Unit/ml Susp (Insulin Isophane Human) .... 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 of COMMON MIGRAINE    Past Surgical History: Reviewed history from 11/01/2007 and no changes required. removal of lung mass  Family History: Reviewed history from 05/28/2008 and no changes required. DM-- F and sisters MI-- F at age 49 colon ca--no breast ca--no  Social History: Reviewed history from 10/01/2008 and no changes required. Married 1 son  moved from [place]-- quit in the 80s ETOH-- rarely   Review of Systems CV:  Denies chest pain or discomfort and swelling of feet; occasional  palpitations if CBGs low . Resp:  Denies cough and shortness of breath. GI:  Denies bloody stools, nausea, and vomiting. GU:  Denies discharge; no vag bleed .  Physical Exam  General:  alert, well-developed, and overweight-appearing.   Neck:  normal carotid upstroke.   Lungs:  normal respiratory effort, no intercostal retractions, no accessory muscle use, and normal breath sounds.   Heart:  normal rate, regular rhythm, no murmur, and no gallop.   Abdomen:  soft, non-tender, no distention, no masses, no guarding, and no rigidity.   Extremities:  no pretibial edema bilaterally  Psych:  Cognition and judgment appear intact. Alert and cooperative with normal attention span and concentration. not anxious appearing and not depressed appearing.     Impression & Recommendations:  Problem # 1:  HEALTH SCREENING (ICD-V70.0) Td 2009 declined a flu shot , does not want one  has declined pneumonia shot consistently  explained benefits of shots  including shingles shot   has seen Dr Toney Rakes  before-- patient thinks  she saw him 04-2009, states they did a DEXA, PAP last MM[facility]  ?2 years ago --------will refer for another one   colonoscopy: never had one and is not interested , had  a neg hemocults 9-09, 04-2009 again discussed the benefits of a colonoscopy issued iFOB  diet and exercise discussed  Orders: Radiology Referral (Radiology) Medicare -1st Annual Wellness Visit [phone])  Problem # 2:  HYPERLIPIDEMIA (ICD-272.4) patient is on both diltiazem  and Zocor Recommend change to Lipitor 20 mg d/t interactions   Her updated medication list for this problem includes:    Lipitor 20 Mg Tabs (Atorvastatin calcium) .[name]... 1 by mouth once daily  Labs Reviewed: SGOT: 18 (07/01/2009)   SGPT: 13 (07/01/2009)   HDL:47.80 (10/01/2008), 37.7 (09/01/2007)  LDL:76 (10/01/2008), 77 (09/01/2007)  Chol:140 (10/01/2008), 132 (09/01/2007)  Trig:79.0 (10/01/2008), 88 (09/01/2007)  Orders: TLB-ALT (SGPT) (84460-ALT) TLB-AST (SGOT) (84450-SGOT) TLB-Lipid Panel (80061-LIPID) Specimen Handling (99000)  Problem # 3:  HYPERTENSION (ICD-401.9) at goal  Her updated medication list for this problem includes:    Avapro 150 Mg Tabs (Irbesartan) .[name]... 1 by mouth once daily    Diltiazem Hcl 120 Mg Tabs (Diltiazem hcl) .[name]... 1 by mouth qd  BP today: 136/72 Prior BP: 140/72 (01/30/2010)  Labs Reviewed: K+: 4.4 (07/01/2009) Creat: : 1.2 (07/01/2009)   Chol: 140 (10/01/2008)   HDL: 47.80 (10/01/2008)   LDL: 76 (10/01/2008)   TG: 79.0 (10/01/2008)  Orders: Venipuncture [id number]) TLB-BMP (Basic Metabolic Panel-BMET) (99991111) Specimen Handling (99000)  Problem # 4:  ATRIAL FLUTTER, PAROXYSMAL (ICD-427.32) on coumadin , has not seen cards x years refer to cards: will ask to reasses, continue w/ coumadin??  Her updated medication list for this problem includes:    Diltiazem Hcl 120 Mg Tabs (Diltiazem hcl) .[name]... 1 by mouth qd    Coumadin 5 Mg  Tabs (Warfarin sodium) .[name]... Take as directed by coumadin clinic.  Orders: Cardiology Referral (Cardiology)  Problem # 5:  OSTEOPENIA (ICD-733.90) DEXA showed osteopenia in 2004, repeated DEXA normal 03-2007 reports she got a bone density test earlier this year @ gynecology. We'll try to records  Her updated medication list for this problem includes:    Oscal 500/200 D-3 Tabs (Calcium-vitamin d tabs)      Complete Medication List: 1)  Avapro 150 Mg Tabs (Irbesartan) .[name].. 1 by mouth once daily 2)  Diltiazem Hcl 120 Mg Tabs (Diltiazem hcl) .[name].. 1 by mouth qd 3)  Asa  4)  Mvi  5)  Oscal 500/200 D-3 Tabs (Calcium-vitamin d tabs) 6)  Lipitor 20 Mg Tabs (Atorvastatin calcium) .[name].. 1 by mouth once daily 7)  Coumadin 5 Mg Tabs (Warfarin sodium) .... Take as directed by coumadin clinic. 8)  Vicodin 5-500 Mg Tabs (Hydrocodone-acetaminophen) .[name].. 1-2 by mouth every 6 hours as needed ha 9)  Humulin N 100 Unit/ml Susp (Insulin isophane human) .... 50 units am and 25 units pm  Patient Instructions: 1)  Please schedule a follow-up appointment in 3 months, fasting  Prescriptions: LIPITOR 20 MG TABS (ATORVASTATIN CALCIUM) 1 by mouth once daily  #90 x 1   Entered and Authorized by:   Jacqulyn Bath E. [name] MD   Signed by:   Alda Berthold. [name] MD on 02/03/2010   Method used:   Print then Give to Patient   RxID:   [id number] ZOCOR 40 MG TABS (SIMVASTATIN) 1 by mouth daily.  #90 Each x 2   Entered by:   [name] CMA   Authorized by:   Alda Berthold. [name] MD   Signed by:   Alda Berthold. [name] MD on 02/03/2010   Method used:   Print then Give to Patient   RxID:   [id number] DILTIAZEM HCL 120 MG  TABS (DILTIAZEM HCL) 1 by mouth QD  #90 Each x 2   Entered by:   [name] CMA   Authorized by:   Alda Berthold. [name] MD   Signed by:   Alda Berthold. [name] MD on 02/03/2010   Method used:   Print then Give to Patient   RxID:   [id number] AVAPRO 150 MG  TABS (IRBESARTAN) 1 by mouth once daily  #90 Each x 3   Entered by:    [name] CMA   Authorized by:   Alda Berthold. [name] MD   Signed by:   Alda Berthold. [name] MD on 02/03/2010   Method used:   Print then Give to Patient   RxID:   [id number]    Orders Added: 1)  Venipuncture [id number] 2)  TLB-BMP (Basic Metabolic Panel-BMET) 123456 3)  TLB-ALT (SGPT) [84460-ALT] 4)  TLB-AST (SGOT) [84450-SGOT] 5)  TLB-Lipid Panel [80061-LIPID] 6)  Specimen Handling [99000] 7)  Radiology Referral [Radiology] 8)  Cardiology Referral [Cardiology] 9)  Est. Patient Level III [id number])  Medicare -1st Annual Wellness Visit [id number]

## 2010-04-01 NOTE — Progress Notes (Signed)
Summary: lipitor to expensive  Phone Note Call from Patient Call back at Home Phone [phone]   Caller: Patient Summary of Call: Pt called and states that she has no insurance and the generic Lipitor is over $400. Is there an alternative for her?  Initial call taken by: [name] CMA,  February 04, 2010 4:10 PM  Follow-up for Phone Call        try pravastatin 40 mg 1 by mouth at bedtime  Follow-up by: [facility] E. [name] MD,  February 06, 2010 4:55 PM    New/Updated Medications: PRAVACHOL 40 MG TABS (PRAVASTATIN SODIUM) 1 by mouth at bedtime. Prescriptions: PRAVACHOL 40 MG TABS (PRAVASTATIN SODIUM) 1 by mouth at bedtime.  #30 x 1   Entered by:   [name] CMA   Authorized by:   Alda Berthold. [name] MD   Signed by:   [name] CMA on 02/06/2010   Method used:   Electronically to        [facility] (retail)       [id number]       [place], [place]  [address]       Ph: [phone]       Fax: [phone]   RxID:   [id number]

## 2010-04-01 NOTE — Medication Information (Signed)
Summary: rov/tm  Anticoagulant Therapy  Managed by: Porfirio Oar, PharmD Referring MD: Dorris Carnes Supervising MD: Lovena Le MD, Carleene Overlie Indication 1: Atrial Fibrillation (ICD-427.31) Lab Used: [facility] Site: [facility] INR POC 2.4 INR RANGE 2 - 3  Dietary changes: no    Health status changes: no    Bleeding/hemorrhagic complications: no    Recent/future hospitalizations: no    Any changes in medication regimen? no    Recent/future dental: no  Any missed doses?: no       Is patient compliant with meds? yes       Allergies: 1)  * Novacaine  Anticoagulation Management History:      The patient is taking warfarin and comes in today for a routine follow up visit.  Positive risk factors for bleeding include an age of 55 years or older and presence of serious comorbidities.  The bleeding index is 'intermediate risk'.  Positive CHADS2 values include History of HTN and History of Diabetes.  Negative CHADS2 values include Age > 69 years old.  The start date was 03/07/2005.  Her last INR was 3.3.  Anticoagulation responsible provider: Lovena Le MD, Carleene Overlie.  INR POC: 2.4.  Cuvette Lot#: [id number].  Exp: 10/2010.    Anticoagulation Management Assessment/Plan:      The patient's current anticoagulation dose is Coumadin 5 mg tabs: Take as directed by coumadin clinic..  The target INR is 2 - 3.  The next INR is due 08/27/2009.  Anticoagulation instructions were given to patient.  Results were reviewed/authorized by Porfirio Oar, PharmD.  She was notified by Porfirio Oar PharmD.         Prior Anticoagulation Instructions: INR 2.3 Continue 2.5mg s daily except 5mg s on Mondays, Wednesdays and Fridays. Recheck in 3 weeks.   Current Anticoagulation Instructions: INR 2.4  Continue same dose of 1/2 tablet every day except 1 tablet on Monday, Wednesday and Friday

## 2010-04-01 NOTE — Medication Information (Signed)
Summary: rov/sp  Anticoagulant Therapy  Managed by: Gwynneth Albright, PharmD Referring MD: Dorris Carnes Supervising MD: Haroldine Laws MD, Quillian Quince Indication 1: Atrial Fibrillation (ICD-427.31) Lab Used: [facility] Site: [facility] INR POC 2.1 INR RANGE 2 - 3  Dietary changes: no    Health status changes: no    Bleeding/hemorrhagic complications: yes       Details: Bruising on arm, but improving.  Recent/future hospitalizations: no    Any changes in medication regimen? no    Recent/future dental: no  Any missed doses?: no       Is patient compliant with meds? yes       Allergies: 1)  * Novacaine  Anticoagulation Management History:      The patient is taking warfarin and comes in today for a routine follow up visit.  Positive risk factors for bleeding include an age of 69 years or older and presence of serious comorbidities.  The bleeding index is 'intermediate risk'.  Positive CHADS2 values include History of HTN and History of Diabetes.  Negative CHADS2 values include Age > 95 years old.  The start date was 03/07/2005.  Her last INR was 3.3.  Anticoagulation responsible provider: Janel Beane MD, Quillian Quince.  INR POC: 2.1.  Cuvette Lot#: [id number].  Exp: 10/2010.    Anticoagulation Management Assessment/Plan:      The patient's current anticoagulation dose is Coumadin 5 mg tabs: Take as directed by coumadin clinic..  The target INR is 2 - 3.  The next INR is due 09/25/2009.  Anticoagulation instructions were given to patient.  Results were reviewed/authorized by Gwynneth Albright, PharmD.  She was notified by Gwynneth Albright, PharmD.         Prior Anticoagulation Instructions: INR 2.4  Continue same dose of 1/2 tablet every day except 1 tablet on Monday, Wednesday and Friday   Current Anticoagulation Instructions: INR 2.1. Take 0.5 tablet daily except 1 tablet on Mon, Wed, Fri. Recheck in

## 2010-04-01 NOTE — Medication Information (Signed)
Summary: [name] [name] [facility]  Anticoagulant Therapy  Managed by: Tula Nakayama, RN, BSN Referring MD: Dorris Carnes Supervising MD: Verl Blalock MD, Marcello Moores Indication 1: Atrial Fibrillation (ICD-427.31) Lab Used: [facility] Site: [facility] INR POC 4.3 INR RANGE 2 - 3  Dietary changes: no    Health status changes: no    Bleeding/hemorrhagic complications: no    Recent/future hospitalizations: no    Any changes in medication regimen? no    Recent/future dental: no  Any missed doses?: no       Is patient compliant with meds? yes       Allergies: 1)  * Novacaine  Anticoagulation Management History:      The patient is taking warfarin and comes in today for a routine follow up visit.  Positive risk factors for bleeding include an age of 70 years or older and presence of serious comorbidities.  The bleeding index is 'intermediate risk'.  Positive CHADS2 values include History of HTN and History of Diabetes.  Negative CHADS2 values include Age > 69 years old.  The start date was 03/07/2005.  Her last INR was 3.3.  Anticoagulation responsible provider: Verl Blalock MD, Marcello Moores.  INR POC: 4.3.  Cuvette Lot#: [id number].  Exp: 06/30/2010.    Anticoagulation Management Assessment/Plan:      The patient's current anticoagulation dose is Coumadin 5 mg tabs: Take as directed by coumadin clinic..  The target INR is 2 - 3.  The next INR is due 04/25/2009.  Anticoagulation instructions were given to patient.  Results were reviewed/authorized by Tula Nakayama, RN, BSN.  She was notified by Tula Nakayama, RN, BSN.         Prior Anticoagulation Instructions: INR  3.0  Ok to eat a salad tonight  Coumadin 1 tab = 5mg  Mon, Wed, Fri 1/2 tab = 2.5mg  Tu, Thur, Sat, Sun  Current Anticoagulation Instructions: INR 4.3 Skip today's dose then change dose to 1/2 pill everyday except 1 pill on Mondays and Wednesdays. Recheck in 2 weeks.

## 2010-04-01 NOTE — Assessment & Plan Note (Signed)
Summary: f/u appt/#/cd   Vital Signs:  Patient profile:   70 year old female Height:      69 inches (175.26 cm) Weight:      229.25 pounds (104.20 kg) BMI:     33.98 O2 Sat:      96 % on Room air Temp:     97.0 degrees F (36.11 degrees C) oral Pulse rate:   62 / minute BP sitting:   122 / 64  (left arm) Cuff size:   large  Vitals Entered By: Rebeca Alert MA (October 08, 2009 1:52 PM)  O2 Flow:  Room air CC: F/U appt/aj Is Patient Diabetic? Yes   CC:  F/U appt/aj.  History of Present Illness: no cbg record, but states cbg's are sometimes mildly low at lunch, or at 2 am.  she says otherwise, it is well-controlled.  pt states she feels well in general.    Current Medications (verified): 1)  Avapro 150 Mg  Tabs (Irbesartan) .[name].. 1 By Mouth Once Daily 2)  Diltiazem Hcl 120 Mg  Tabs (Diltiazem Hcl) .[name].. 1 By Mouth Qd 3)  Asa 4)  Mvi 5)  Oscal 500/200 D-3   Tabs (Calcium-Vitamin D Tabs) 6)  Simvastatin 80 Mg  Tabs (Simvastatin) .[name].. 1 By Mouth At Bedtime 7)  Coumadin 5 Mg Tabs (Warfarin Sodium) .... Take As Directed By Coumadin Clinic. 8)  Vicodin 5-500 Mg Tabs (Hydrocodone-Acetaminophen) .[name].. 1-2 By Mouth Every 6 Hours As Needed Ha 9)  Humulin N 100 Unit/ml Susp (Insulin Isophane Human) .... 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 of COMMON MIGRAINE    Review of Systems  The patient denies syncope.    Physical Exam  General:  obese.  no distress  Psych:  Alert and cooperative; normal mood and affect; normal attention span and concentration.   Additional Exam:  Hemoglobin A1C       [H]  8.8 %   Impression &  Recommendations:  Problem # 1:  DIABETES MELLITUS, TYPE II (ICD-250.00) needs increased rx, but i need cbg info in order to safely increase insulin  Other Orders: TLB-A1C / Hgb A1C (Glycohemoglobin) (83036-A1C)  Patient Instructions: 1)  a blood test is being ordered for you today.  a few days after the test(s), please call [phone] to hear your test results.  2)  pending the test results, please continue the same medications for now 3)  Please schedule a follow-up appointment in 3 months. 4)  (update: i left message on phone-tree:  same insulin.  carefully check cbg's to see the pattern of cbg's--especially the lows).

## 2010-04-01 NOTE — Medication Information (Signed)
Summary: rov/sp   Anticoagulant Therapy  Managed by: Gypsy Lore, PharmD Referring MD: Dorris Carnes Supervising MD: Stanford Breed MD, Aaron Edelman Indication 1: Atrial Fibrillation (ICD-427.31) Lab Used: [facility] Site: [facility] INR POC 3.1 INR RANGE 2 - 3  Dietary changes: no    Health status changes: no    Bleeding/hemorrhagic complications: no    Recent/future hospitalizations: no    Any changes in medication regimen? no    Recent/future dental: no  Any missed doses?: no       Is patient compliant with meds? yes       Allergies: 1)  * Novacaine  Anticoagulation Management History:      The patient is taking warfarin and comes in today for a routine follow up visit.  Positive risk factors for bleeding include an age of 69 years or older and presence of serious comorbidities.  The bleeding index is 'intermediate risk'.  Positive CHADS2 values include History of HTN and History of Diabetes.  Negative CHADS2 values include Age > 8 years old.  The start date was 03/07/2005.  Her last INR was 3.3.  Anticoagulation responsible provider: Stanford Breed MD, Aaron Edelman.  INR POC: 3.1.  Cuvette Lot#: [id number].  Exp: 01/2011.    Anticoagulation Management Assessment/Plan:      The patient's current anticoagulation dose is Coumadin 5 mg tabs: Take as directed by coumadin clinic..  The target INR is 2 - 3.  The next INR is due 02/07/2010.  Anticoagulation instructions were given to patient.  Results were reviewed/authorized by Gypsy Lore, PharmD.  She was notified by Gypsy Lore PharmD.         Prior Anticoagulation Instructions: INR 2.6  Continue same dose of 1/2 tablet every day except 1 tablet on Monday, Wednesday and Friday.  Recheck INR in 4 week.   Current Anticoagulation Instructions: INR 3.1  Today, Friday, November 11th, take Coumadin 0.5 tab (2.5 mg). Then, resume taking Coumadin 0.5 tab (2.5 mg) on Sun, Tues, Thur, Sat and Coumadin 1 tab (5 mg) on Mon, Wed, Fri. Return to clinic in 4  weeks.

## 2010-04-01 NOTE — Assessment & Plan Note (Signed)
Summary: ADD TO SCHEDULE PER STACIA///SPH   Vital Signs:  Patient profile:   70 year old female Height:      69 inches Weight:      225.6 pounds Pulse rate:   74 / minute BP sitting:   112 / 64  Vitals Entered By: Dawson Bills (Jul 01, 2009 12:55 PM) CC: rov   History of Present Illness: ROV was seen w/ HA recently, symptoms resolved  DIABETES-- per Dr [name], not yet well controlled  has some neuropathy, not the painful type, wonders if needs neurontin needs several RFs      Current Medications (verified): 1)  Avapro 150 Mg  Tabs (Irbesartan) .[name].. 1 By Mouth Once Daily 2)  Diltiazem Hcl 120 Mg  Tabs (Diltiazem Hcl) .[name].. 1 By Mouth Qd 3)  Asa 4)  Mvi 5)  Oscal 500/200 D-3   Tabs (Calcium-Vitamin D Tabs) 6)  Simvastatin 80 Mg  Tabs (Simvastatin) .[name].. 1 By Mouth At Bedtime 7)  Coumadin 5 Mg Tabs (Warfarin Sodium) .... Take As Directed By Coumadin Clinic. 8)  Humulin 70/30 70-30 % Susp (Insulin Isophane & Regular) .... 46 Units Am and 20 Units Pm 9)  Vicodin 5-500 Mg Tabs (Hydrocodone-Acetaminophen) .[name].. 1-2 By Mouth Every 6 Hours As Needed Ha 10)  Neurontin 100 Mg Caps (Gabapentin) .[name].. 1 By Mouth Two Times A Day, 2 By Mouth Qhs  Allergies (verified): 1)  * Novacaine  Past History:  Past Medical History: DIABETES MELLITUS, w/ NEUROPATHY  RETINOPATHY from laser surgery  HYPERLIPIDEMIA  HYPERTENSION   ATRIAL FLUTTER, PAROXYSMAL  used to see Dr Lovena Le  LUNG NODULE EXCISION 2008, bX BENIGN  OSTEOPENIA?, DEXA 2004 showed osteopenia, DEXA  03-2007 normal ? of COMMON MIGRAINE    Past Surgical History: Reviewed history from 11/01/2007 and no changes required. removal of lung mass  Social History: Reviewed history from 10/01/2008 and no changes required. Married 1 son  moved from [place]-- quit in the 80s ETOH-- rarely   Review of Systems CV:  Denies chest pain or discomfort and swelling of feet; no ambulatory BPs . Resp:  Denies cough and shortness of  breath.  Physical Exam  General:  alert and well-developed.   Lungs:  normal respiratory effort, no intercostal retractions, no accessory muscle use, and normal breath sounds.   Heart:  normal rate, regular rhythm, no murmur, and no gallop.   Extremities:  no lower extremity edema   Impression & Recommendations:  Problem # 1:  HEADACHE (ICD-784.0) seen w/  severe headache 3/11, headache is resolved,  CT was unremarkable Her updated medication list for this problem includes:    Vicodin 5-500 Mg Tabs (Hydrocodone-acetaminophen) .[name]... 1-2 by mouth every 6 hours as needed ha  Problem # 2:  DIABETES MELLITUS, TYPE II (ICD-250.00) per Dr. [name] mild neuropathy on exam, see previous visits, does not have a painful neuropathy.  Will go ahead and discontinue Neurontin per patient request Her updated medication list for this problem includes:    Avapro 150 Mg Tabs (Irbesartan) .[name]... 1 by mouth once daily    Humulin 70/30 70-30 % Susp (Insulin isophane & regular) .Marland KitchenMarland KitchenMarland KitchenMarland Kitchen 46 units am and 20 units pm  Problem # 3:  HYPERLIPIDEMIA (ICD-272.4) at goal, labs  Her updated medication list for this problem includes:    Simvastatin 80 Mg Tabs (Simvastatin) .[name]... 1 by mouth at bedtime  Labs Reviewed: SGOT: 19 (10/01/2008)   SGPT: 14 (10/01/2008)   HDL:47.80 (10/01/2008), 37.7 (09/01/2007)  LDL:76 (  10/01/2008), 77 (09/01/2007)  Chol:140 (10/01/2008), 132 (09/01/2007)  Trig:79.0 (10/01/2008), 88 (09/01/2007)  Orders: TLB-ALT (SGPT) (84460-ALT) TLB-AST (SGOT) (84450-SGOT)  Problem # 4:  HYPERTENSION (ICD-401.9) at goal  Her updated medication list for this problem includes:    Avapro 150 Mg Tabs (Irbesartan) .[name]... 1 by mouth once daily    Diltiazem Hcl 120 Mg Tabs (Diltiazem hcl) .[name]... 1 by mouth qd    BP today: 112/64 Prior BP: 120/80 (05/07/2009)  Labs Reviewed: K+: 4.3 (10/01/2008) Creat: : 1.2 (10/01/2008)   Chol: 140 (10/01/2008)   HDL: 47.80 (10/01/2008)   LDL: 76 (10/01/2008)   TG:  79.0 (10/01/2008)  Orders: Venipuncture [id number]) TLB-BMP (Basic Metabolic Panel-BMET) (99991111)  Complete Medication List: 1)  Avapro 150 Mg Tabs (Irbesartan) .[name].. 1 by mouth once daily 2)  Diltiazem Hcl 120 Mg Tabs (Diltiazem hcl) .[name].. 1 by mouth qd 3)  Asa  4)  Mvi  5)  Oscal 500/200 D-3 Tabs (Calcium-vitamin d tabs) 6)  Simvastatin 80 Mg Tabs (Simvastatin) .[name].. 1 by mouth at bedtime 7)  Coumadin 5 Mg Tabs (Warfarin sodium) .... Take as directed by coumadin clinic. 8)  Humulin 70/30 70-30 % Susp (Insulin isophane & regular) .... 46 units am and 20 units pm 9)  Vicodin 5-500 Mg Tabs (Hydrocodone-acetaminophen) .[name].. 1-2 by mouth every 6 hours as needed ha  Patient Instructions: 1)  Please schedule a follow-up appointment in  6 months  (yearly check up) Prescriptions: NEURONTIN 100 MG CAPS (GABAPENTIN) 1 by mouth two times a day, 2 by mouth QHS  #90 x 3   Entered by:   Dawson Bills   Authorized by:   Alda Berthold. Zooey Schreurs MD   Signed by:   Dawson Bills on 07/01/2009   Method used:   Print then Give to Patient   RxID:   [id number] SIMVASTATIN 80 MG  TABS (SIMVASTATIN) 1 by mouth at bedtime  #90 x 3   Entered by:   Dawson Bills   Authorized by:   Alda Berthold. Shyloh Derosa MD   Signed by:   Dawson Bills on 07/01/2009   Method used:   Print then Give to Patient   RxID:   [id number] DILTIAZEM HCL 120 MG  TABS (DILTIAZEM HCL) 1 by mouth QD  #90 x 3   Entered by:   Dawson Bills   Authorized by:   Alda Berthold. Garlin Batdorf MD   Signed by:   Dawson Bills on 07/01/2009   Method used:   Print then Give to Patient   RxID:   [id number] AVAPRO 150 MG  TABS (IRBESARTAN) 1 by mouth once daily  #90 Each x 3   Entered by:   Dawson Bills   Authorized by:   Alda Berthold. Zander Ingham MD   Signed by:   Dawson Bills on 07/01/2009   Method used:   Print then Give to Patient   RxID:   [id number]

## 2010-04-01 NOTE — Assessment & Plan Note (Signed)
Summary: f/u appt per pt/cd   Vital Signs:  Patient profile:   70 year old female Height:      69 inches (175.26 cm) Weight:      227 pounds (103.18 kg) O2 Sat:      97 % on Room air Temp:     97.5 degrees F (36.39 degrees C) oral Pulse rate:   70 / minute BP sitting:   130 / 70  (left arm) Cuff size:   large  Vitals Entered By: Gardenia Phlegm RMA (Jul 26, 2009 10:30 AM)  O2 Flow:  Room air CC: Follow-up visit/ pt states she no longer takes Vicodin/ CF Is Patient Diabetic? Yes   CC:  Follow-up visit/ pt states she no longer takes Vicodin/ CF.  History of Present Illness: no cbg record, but states cbg's are seldom low (this happens if lunch is delayed.  it is highest before evening meal--occasionally over 200).   she has pain at the right foot, after she wore new shoes at a wedding.  Current Medications (verified): 1)  Avapro 150 Mg  Tabs (Irbesartan) .[name].. 1 By Mouth Once Daily 2)  Diltiazem Hcl 120 Mg  Tabs (Diltiazem Hcl) .[name].. 1 By Mouth Qd 3)  Asa 4)  Mvi 5)  Oscal 500/200 D-3   Tabs (Calcium-Vitamin D Tabs) 6)  Simvastatin 80 Mg  Tabs (Simvastatin) .[name].. 1 By Mouth At Bedtime 7)  Coumadin 5 Mg Tabs (Warfarin Sodium) .... Take As Directed By Coumadin Clinic. 8)  Humulin 70/30 70-30 % Susp (Insulin Isophane & Regular) .... 46 Units Am and 20 Units Pm 9)  Vicodin 5-500 Mg Tabs (Hydrocodone-Acetaminophen) .[name].. 1-2 By Mouth Every 6 Hours As Needed Ha  Allergies (verified): 1)  * Novacaine  Past History:  Past Medical History: Last updated: 07/01/2009 DIABETES MELLITUS, w/ NEUROPATHY  RETINOPATHY from laser surgery  HYPERLIPIDEMIA  HYPERTENSION   ATRIAL FLUTTER, PAROXYSMAL  used to see Dr Lovena Le  LUNG NODULE EXCISION 2008, bX BENIGN  OSTEOPENIA?, DEXA 2004 showed osteopenia, DEXA  03-2007 normal ? of COMMON MIGRAINE    Review of Systems  The patient denies dyspnea on exertion.    Physical Exam  General:  obese.   Pulses:  dorsalis pedis intact bilat.     Extremities:  no deformity.  no ulcer on the feet.  feet are of normal color and temp.  no edema.  there are 2 healing abrasions near the right foot mtp.  Neurologic:  sensation is intact to touch on the feet  Additional Exam:  a1c=8.9   Impression & Recommendations:  Problem # 1:  DIABETES MELLITUS, TYPE II (ICD-250.00) she neds a simpler regimen  Medications Added to Medication List This Visit: 1)  Humulin N 100 Unit/ml Susp (Insulin isophane human) .... 50 units am and 25 units pm  Other Orders: TLB-A1C / Hgb A1C (Glycohemoglobin) (83036-A1C) Est. Patient Level III [id number])  Patient Instructions: 1)  a blood test is being ordered for you today.  a few days after the test(s), please call [phone] to hear your test results.  2)  pending the test results, please continue the same medications for now 3)  Please schedule a follow-up appointment in 3 months. 4)  (update: i left message on phone-tree:  change to nph 50 units am and 25 units pm) Prescriptions: HUMULIN N 100 UNIT/ML SUSP (INSULIN ISOPHANE HUMAN) 50 units am and 25 units pm  #3 vials x 11   Entered and Authorized by:   Donavan Foil  MD   Signed by:   Donavan Foil MD on 07/26/2009   Method used:   Electronically to        [facility] [phone]* (retail)       [address]       [place],   [address]       Ph: [phone]       Fax: [phone]   RxID:   [id number]

## 2010-04-01 NOTE — Medication Information (Signed)
Summary: rov/sp   Anticoagulant Therapy  Managed by: Porfirio Oar, PharmD Referring MD: Dorris Carnes Supervising MD: Caryl Comes MD, Remo Lipps Indication 1: Atrial Fibrillation (ICD-427.31) Lab Used: [facility] Site: [facility] INR POC 2.6 INR RANGE 2 - 3  Dietary changes: no    Health status changes: no    Bleeding/hemorrhagic complications: no    Recent/future hospitalizations: no    Any changes in medication regimen? no    Recent/future dental: no  Any missed doses?: no       Is patient compliant with meds? yes       Allergies: 1)  * Novacaine  Anticoagulation Management History:      The patient is taking warfarin and comes in today for a routine follow up visit.  Positive risk factors for bleeding include an age of 70 years or older and presence of serious comorbidities.  The bleeding index is 'intermediate risk'.  Positive CHADS2 values include History of HTN and History of Diabetes.  Negative CHADS2 values include Age > 22 years old.  The start date was 03/07/2005.  Her last INR was 3.3.  Anticoagulation responsible Lucillie Kiesel: Caryl Comes MD, Remo Lipps.  INR POC: 2.6.  Exp: 01/2011.    Anticoagulation Management Assessment/Plan:      The patient's current anticoagulation dose is Coumadin 5 mg tabs: Take as directed by coumadin clinic..  The target INR is 2 - 3.  The next INR is due 01/10/2010.  Anticoagulation instructions were given to patient.  Results were reviewed/authorized by Porfirio Oar, PharmD.  She was notified by Porfirio Oar PharmD.         Prior Anticoagulation Instructions: INR 2.6  Continue same dose of 1/2 tablet every day except 1 tablet on Monday, Wednesday and Friday.  Recheck INR in 4 weeks.   Current Anticoagulation Instructions: INR 2.6  Continue same dose of 1/2 tablet every day except 1 tablet on Monday, Wednesday and Friday.  Recheck INR in 4 week.

## 2010-04-01 NOTE — Medication Information (Signed)
Summary: rov/sp  Anticoagulant Therapy  Managed by: Tula Nakayama, RN, BSN Referring MD: Dorris Carnes Supervising MD: Johnsie Cancel MD, Collier Salina Indication 1: Atrial Fibrillation (ICD-427.31) Lab Used: [facility] Site: [facility] INR POC 2.3 INR RANGE 2 - 3  Dietary changes: no    Health status changes: no    Bleeding/hemorrhagic complications: no    Recent/future hospitalizations: no    Any changes in medication regimen? no    Recent/future dental: no  Any missed doses?: no       Is patient compliant with meds? yes       Allergies: 1)  * Novacaine  Anticoagulation Management History:      The patient is taking warfarin and comes in today for a routine follow up visit.  Positive risk factors for bleeding include an age of 70 years or older and presence of serious comorbidities.  The bleeding index is 'intermediate risk'.  Positive CHADS2 values include History of HTN and History of Diabetes.  Negative CHADS2 values include Age > 33 years old.  The start date was 03/07/2005.  Her last INR was 3.3.  Anticoagulation responsible provider: Johnsie Cancel MD, Collier Salina.  INR POC: 2.3.  Cuvette Lot#: [id number].  Exp: 10/2010.    Anticoagulation Management Assessment/Plan:      The patient's current anticoagulation dose is Coumadin 5 mg tabs: Take as directed by coumadin clinic..  The target INR is 2 - 3.  The next INR is due 07/30/2009.  Anticoagulation instructions were given to patient.  Results were reviewed/authorized by Tula Nakayama, RN, BSN.  She was notified by Tula Nakayama, RN, BSN.         Prior Anticoagulation Instructions: INR 1.9  Increase dose to 1/2 tablet every day except 1 tablet on Monday, Wednesday and Friday   Current Anticoagulation Instructions: INR 2.3 Continue 2.5mg s daily except 5mg s on Mondays, Wednesdays and Fridays. Recheck in 3 weeks.

## 2010-04-01 NOTE — Progress Notes (Signed)
Summary: check on pt   ---- Converted from flag ---- ---- 05/08/2009 2:28 PM, Malachi Bonds wrote:   ---- 05/07/2009 4:43 PM, Dawson Bills wrote: dr Larose Kells wants [place] to check on this pt in 2-3 days, can you call her thurs or fri ------------------------------  Phone Note Outgoing Call   Call placed by: Malachi Bonds,  May 10, 2009 8:54 AM Call placed to: Patient Summary of Call: spoke w/ patient says she is doing fine and will call if any problems or concerns........Marland KitchenMalachi Bonds  May 10, 2009 8:54 AM

## 2010-04-01 NOTE — Letter (Signed)
Summary: Results Follow up Letter  [name]   [place], [place] [address]   Phone: [phone]  Fax: [phone]    04/22/2009 MRN: [id number]  [facility] [id number] [place], [place]  [address]  Dear Ms. Jump,  The following are the results of your recent test(s):  Test         Result    Pap Smear:        Normal _____  Not Normal _____ Comments: ______________________________________________________ Cholesterol: LDL(Bad cholesterol):         Your goal is less than:         HDL (Good cholesterol):       Your goal is more than: Comments:  ______________________________________________________ Mammogram:        Normal _____  Not Normal _____ Comments:  ___________________________________________________________________ Hemoccult:        Normal ___X__  Not normal _______ Comments:    ___________________STOOL TEST WAS NEGATIVE FOR BLOOD __________________________________________________ Other Tests:    We routinely do not discuss normal results over the telephone.  If you desire a copy of the results, or you have any questions about this information we can discuss them at your next office visit.   Sincerely,

## 2010-04-01 NOTE — Letter (Signed)
Summary: [facility]: Immunoassay Fecal Occult Blood (iFOB) Order Form  [facility]   [place], [place] [address]   Phone: [phone]  Fax: [phone]      [facility]: Immunoassay Fecal Occult Blood (iFOB) Order Form   February 05, 2010 MRN: [id number]   [name] [name] [birth year]   Physicican Name:_____jose paz, md ____________________  Diagnosis Code:___v76.51_______________________      [name] CMA

## 2010-04-01 NOTE — Medication Information (Signed)
Summary: rov/jm  Anticoagulant Therapy  Managed by: Bonnita Nasuti, PharmD, BCPS, CPP Referring MD: Dorris Carnes Supervising MD: Lovena Le MD, Carleene Overlie Indication 1: Atrial Fibrillation (ICD-427.31) Lab Used: [facility] Site: [facility] INR POC 3.0 INR RANGE 2 - 3  Dietary changes: no    Health status changes: no    Bleeding/hemorrhagic complications: no    Recent/future hospitalizations: no    Any changes in medication regimen? no    Recent/future dental: no  Any missed doses?: no       Is patient compliant with meds? yes       Current Medications (verified): 1)  Avapro 150 Mg  Tabs (Irbesartan) .[name].. 1 By Mouth Once Daily 2)  Diltiazem Hcl 120 Mg  Tabs (Diltiazem Hcl) .[name].. 1 By Mouth Qd 3)  Asa 4)  Mvi 5)  Oscal 500/200 D-3   Tabs (Calcium-Vitamin D Tabs) 6)  Simvastatin 80 Mg  Tabs (Simvastatin) .[name].. 1 By Mouth At Bedtime 7)  Coumadin 5 Mg Tabs (Warfarin Sodium) .... Take As Directed By Coumadin Clinic. 8)  Humulin 70/30 70-30 % Susp (Insulin Isophane & Regular) .... 48 Units Am and 18 Units Pm  Allergies: 1)  * Novacaine  Anticoagulation Management History:      The patient is taking warfarin and comes in today for a routine follow up visit.  Positive risk factors for bleeding include an age of 31 years or older and presence of serious comorbidities.  The bleeding index is 'intermediate risk'.  Positive CHADS2 values include History of HTN and History of Diabetes.  Negative CHADS2 values include Age > 58 years old.  The start date was 03/07/2005.  Her last INR was 3.3.  Anticoagulation responsible provider: Lovena Le MD, Carleene Overlie.  INR POC: 3.0.  Cuvette Lot#: [id number].  Exp: 06/30/2010.    Anticoagulation Management Assessment/Plan:      The patient's current anticoagulation dose is Coumadin 5 mg tabs: Take as directed by coumadin clinic..  The target INR is 2 - 3.  The next INR is due 04/11/2009.  Anticoagulation instructions were given to patient.  Results were  reviewed/authorized by Bonnita Nasuti, PharmD, BCPS, CPP.         Prior Anticoagulation Instructions: INR 3.3  DO NOT TAKE TONIGHTS DOSE OF COUMADIN  CONTINUE TO TAKE 1/2 TABLET SUNDAY, TUESDAY, THURSDAY AND SATURDAY. TAKE 1 TABLET ON MONDAY, WEDNESDAY, FRIDAY.  RECHECK IN 4 WEEKS.  Current Anticoagulation Instructions: INR  3.0  Ok to eat a salad tonight  Coumadin 1 tab = 5mg  Mon, Wed, Fri 1/2 tab = 2.5mg  Tu, Thur, Sat, Sun

## 2010-04-01 NOTE — Assessment & Plan Note (Signed)
Summary: dry heaves,headache/kdc   Vital Signs:  Patient profile:   70 year old female Height:      69 inches Weight:      221 pounds Temp:     98.4 degrees F BP sitting:   120 / 80  Vitals Entered By: Dawson Bills (May 07, 2009 1:43 PM) CC: severe HA x 3/5 (worst HA of life), dry heaves, no vomiting, no relief with ASA, tyl, ibuprofen Is Patient Diabetic? Yes Comments FBS this am Hardy  May 07, 2009 1:43 PM    History of Present Illness: developed a headache 4 days ago the pain is steady, located at left eye and radiates to the left head , the right side is uninvolved it has been as intense as 6/10, currently/10 over-the-counter medications did not  help much yesterday she developed dry heaves and some nausea the pain is described as an ache, is not described as burning or tingling in the skin denies a history of previous headaches, this is the worst headache of her life  Current Medications (verified): 1)  Avapro 150 Mg  Tabs (Irbesartan) .[name].. 1 By Mouth Once Daily 2)  Diltiazem Hcl 120 Mg  Tabs (Diltiazem Hcl) .[name].. 1 By Mouth Qd 3)  Asa 4)  Mvi 5)  Oscal 500/200 D-3   Tabs (Calcium-Vitamin D Tabs) 6)  Simvastatin 80 Mg  Tabs (Simvastatin) .[name].. 1 By Mouth At Bedtime 7)  Coumadin 5 Mg Tabs (Warfarin Sodium) .... Take As Directed By Coumadin Clinic. 8)  Humulin 70/30 70-30 % Susp (Insulin Isophane & Regular) .... 46 Units Am and 20 Units Pm 9)  Vicodin 5-500 Mg Tabs (Hydrocodone-Acetaminophen) .[name].. 1-2 By Mouth Every 6 Hours As Needed Ha 10)  Neurontin 100 Mg Caps (Gabapentin) .[name].. 1 By Mouth Two Times A Day, 2 By Mouth Qhs  Allergies (verified): 1)  * Novacaine  Past History:  Past Medical History: Reviewed history from 02/04/2009 and no changes required. DIABETES MELLITUS, w/ NEUROPATHY  RETINOPATHY from laser surgery  HYPERLIPIDEMIA  HYPERTENSION   ATRIAL FLUTTER, PAROXYSMAL  used to see Dr Lovena Le  LUNG NODULE EXCISION 2008, bX BENIGN    OSTEOPENIA?, DEXA 2004 showed osteopenia, DEXA  03-2007 normal ? of COMMON MIGRAINE    Past Surgical History: Reviewed history from 11/01/2007 and no changes required. removal of lung mass  Social History: Reviewed history from 10/01/2008 and no changes required. Married 1 son  moved from [place]-- quit in the 80s ETOH-- rarely   Review of Systems       no fever or sinus congestion no  recent head or neck injury vision is normal, no red  eye, no actual eyeball pain denies slurred speech, motor deficits no rash up until a few hours ago when the husband noted few dots in the L forehead  Physical Exam  General:  alert and well-developed.  no apparent distress Eyes:  EOMI pupils are small but equal and reactive to light right upper eye   lead slightly droopy, not a new finding per patient Neck:  neck is full range of motion Lungs:  normal respiratory effort, no intercostal retractions, no accessory muscle use, and normal breath sounds.   Heart:  normal rate, regular rhythm, no murmur, and no gallop.   Extremities:  no edema Neurologic:  speech clear face symmetric (other than the right eye  lead) extremities strength symmetric gait normal reflexes slightly decreased throughout but symmetric Skin:  at the left forehead  close to the hairline she has 3 one-mm slightly raised "bumps"  Psych:  Oriented X3, not anxious appearing, and not depressed appearing.     Impression & Recommendations:  Problem # 1:  HEADACHE (ICD-784.0) Assessment New new onset of intense headache,  neurological exam is essentially normal DDX migraine, trigeminal neuropathy, shingles (asked patient to monitor her rash and call me if it  get worse) , stroke or  other intracraneal  abnormalities, glaucoma (noting no red eye and symmetric pupils),  etc plan: CT now further advice with results  Orders: Radiology Referral (Radiology)  Her updated medication list for this problem includes:     Vicodin 5-500 Mg Tabs (Hydrocodone-acetaminophen) .[name]... 1-2 by mouth every 6 hours as needed ha  Problem # 2:  addendum CT of the head negative plan: rest, fluids start Vicodin 5 mg one or two every 6 hours as needed for pain,  #40 start Neurontin 100 mg one p.o. b.i.d. and two at bedtime #90 called in 2 -3 days, let us know how she is doing   DISCUSSED WITH PT, RX CALLED Dawson Bills  May 07, 2009 4:42 PM  Complete Medication List: 1)  Avapro 150 Mg Tabs (Irbesartan) .[name].. 1 by mouth once daily 2)  Diltiazem Hcl 120 Mg Tabs (Diltiazem hcl) .[name].. 1 by mouth qd 3)  Asa  4)  Mvi  5)  Oscal 500/200 D-3 Tabs (Calcium-vitamin d tabs) 6)  Simvastatin 80 Mg Tabs (Simvastatin) .[name].. 1 by mouth at bedtime 7)  Coumadin 5 Mg Tabs (Warfarin sodium) .... Take as directed by coumadin clinic. 8)  Humulin 70/30 70-30 % Susp (Insulin isophane & regular) .... 46 units am and 20 units pm 9)  Vicodin 5-500 Mg Tabs (Hydrocodone-acetaminophen) .[name].. 1-2 by mouth every 6 hours as needed ha 10)  Neurontin 100 Mg Caps (Gabapentin) .[name].. 1 by mouth two times a day, 2 by mouth qhs  Prescriptions: NEURONTIN 100 MG CAPS (GABAPENTIN) 1 by mouth two times a day, 2 by mouth QHS  #90 x 0   Entered by:   Dawson Bills   Authorized by:   Alda Berthold. Mirely Pangle MD   Signed by:   Dawson Bills on 05/07/2009   Method used:   Printed then faxed to ...       [facility] [phone]* (retail)       [place], [place]  [address]       Ph: [phone]       Fax: [phone]   RxID:   [id number] VICODIN 5-500 MG TABS (HYDROCODONE-ACETAMINOPHEN) 1-2 by mouth EVERY 6 HOURS as needed HA  #40 x 0   Entered by:   Dawson Bills   Authorized by:   Alda Berthold. Kiarra Kidd MD   Signed by:   Dawson Bills on 05/07/2009   Method used:   Printed then faxed to ...       [facility] [phone]* (retail)       [address]       [place],   [address]       Ph: [phone]        Fax: [phone]   RxID:   [id number]

## 2010-04-01 NOTE — Medication Information (Signed)
Summary: rov/tm  Anticoagulant Therapy  Managed by: Freddrick March, RN, BSN Referring MD: Dorris Carnes Supervising MD: Johnsie Cancel MD, Collier Salina Indication 1: Atrial Fibrillation (ICD-427.31) Lab Used: [facility] Site: [facility] INR POC 2.2 INR RANGE 2 - 3  Dietary changes: no    Health status changes: yes       Details: C/O recent illness nausea  Bleeding/hemorrhagic complications: no    Recent/future hospitalizations: no    Any changes in medication regimen? yes       Details: Started on Gabapentin and taking hydrocodone 5/500 prn  Recent/future dental: no  Any missed doses?: yes     Details: Missed Monday night's dosage 05/06/09.  Is patient compliant with meds? yes       Allergies (verified): 1)  * Novacaine  Anticoagulation Management History:      The patient is taking warfarin and comes in today for a routine follow up visit.  Positive risk factors for bleeding include an age of 69 years or older and presence of serious comorbidities.  The bleeding index is 'intermediate risk'.  Positive CHADS2 values include History of HTN and History of Diabetes.  Negative CHADS2 values include Age > 93 years old.  The start date was 03/07/2005.  Her last INR was 3.3.  Anticoagulation responsible provider: Johnsie Cancel MD, Col[facility] [place].  INR POC: 2.2.  Cuvette Lot#: [id number].  Exp: 07/2010.    Anticoagulation Management Assessment/Plan:      The patient's current anticoagulation dose is Coumadin 5 mg tabs: Take as directed by coumadin clinic..  The target INR is 2 - 3.  The next INR is due 05/29/2009.  Anticoagulation instructions were given to patient.  Results were reviewed/authorized by Freddrick March, RN, BSN.  She was notified by Freddrick March RN.         Prior Anticoagulation Instructions: INR 3.2 Skip today then change dose to 2.5mg s daily except 5mg s on Mondays. Recheck in 2 weeks.   Current Anticoagulation Instructions: INR 2.2  Continue on same dosage 1/2 tablet daily except 1 tablet on  Mondays.  Recheck in 3 weeks.

## 2010-04-03 NOTE — Assessment & Plan Note (Signed)
Summary:  paroxysmal atrial flutter      Allergies Added:   Visit Type:  nep  CC:  no complaints.  History of Present Illness: Mrs. [name] [name] returns today after a long followup from our arrhythmia clinic.  She is a pleasant 70 yo woman with a h/o Atrial flutter s/p ablation, who had a bronchogenic cyst diagnosed several yrs ago and developed atrial fib after surgery.  since then she has done well but has still had occaisional bouts of palpitations lasting minutes at a time.  While she has not had any documented atrial fib, it is presumed that her palpitations are such.  She has not had much in the way of problems with coumadin.  She denies c/p but does have minimal dyspnea with exertion.  Her DM has not been well controlled.  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 of Common Migraine  (ICD-346.10) 11)  Supraventricular Tachycardia, Hx of  (ICD-V12.59) 12)  Health Screening  (ICD-V70.0)  Current Medications (verified): 1)  Avapro 150 Mg  Tabs (Irbesartan) .[name].. 1 By Mouth Once Daily 2)  Diltiazem Hcl 120 Mg  Tabs (Diltiazem Hcl) .[name].. 1 By Mouth Qd 3)  Asa 4)  Mvi 5)  Oscal 500/200 D-3   Tabs (Calcium-Vitamin D Tabs) 6)  Pravachol 40 Mg Tabs (Pravastatin Sodium) .[name].. 1 By Mouth At Bedtime. 7)  Coumadin 5 Mg Tabs (Warfarin Sodium) .... Take As Directed By Coumadin Clinic. 8)  Humulin N 100 Unit/ml Susp (Insulin Isophane Human) .... 50 Units Am and 25 Units Pm  Allergies (verified): 1)  * Novacaine  Past History:  Past Medical History: Last updated: 02/03/2010 DIABETES MELLITUS, w/ NEUROPATHY  RETINOPATHY from laser surgery  HYPERLIPIDEMIA  HYPERTENSION   ATRIAL  FLUTTER, PAROXYSMAL  used to see Dr Lovena Le  LUNG NODULE EXCISION 2008, bX BENIGN  OSTEOPENIA?, DEXA 2004 showed osteopenia, DEXA  03-2007 normal ? of COMMON MIGRAINE    Past Surgical History: Last updated: 11/01/2007 removal of lung mass  Family History: Last updated: 05/28/2008 DM-- F and sisters MI-- F at age 68 colon ca--no breast ca--no  Social History: Last updated: 10/01/2008 Married 1 son  moved from [place]-- quit in the 80s ETOH-- rarely   Risk Factors: Alcohol Use: 0--  Rarely (05/28/2008)  Risk Factors: Smoking Status: never (05/28/2008)  Review of Systems  The patient denies chest pain, syncope, and dyspnea on exertion.    Vital Signs:  Patient profile:   70 year old female Height:      69 inches Weight:      232.75 pounds BMI:     34.50 Pulse rate:   67 / minute BP sitting:   179 / 74  (left arm) Cuff size:   regular  Vitals Entered By: Hansel Feinstein CMA (March 07, 2010 12:58 PM)  Physical Exam  General:  alert, well-developed, and overweight-appearing.   Neck:  normal carotid upstroke.   Chest Wall:  no deformities or breast masses noted Lungs:  Clear bilaterally to auscultation with no wheezes, rales, or rhonchi. Heart:  normal rate, regular rhythm, no murmur, and no gallop.   Abdomen:  soft, non-tender, no distention, no masses, no guarding,  and no rigidity.   Pulses:  dorsalis pedis intact bilat.    Extremities:  no pretibial edema bilaterally  Neurologic:  sensation is intact to touch on the feet.    EKG  Procedure date:  03/07/2010  Findings:      Normal sinus rhythm with rate of:  65.  Impression & Recommendations:  Problem # 1:  ATRIAL FIBRILLATION (ICD-427.31) Her symptoms are well controlled.  She still has some palpitations and I would recommend continueing her coumadin.  Will follow. Her updated medication list for this problem includes:    Coumadin 5 Mg Tabs (Warfarin sodium) .[name]... Take as directed by coumadin  clinic.  Problem # 2:  HYPERTENSION (ICD-401.9) Her blood pressure is elevated in the office today but her husband tells me that at home it is well controlled. A low sodium diet is requested. Her updated medication list for this problem includes:    Avapro 150 Mg Tabs (Irbesartan) .[name]... 1 by mouth once daily    Diltiazem Hcl 120 Mg Tabs (Diltiazem hcl) .[name]... 1 by mouth qd  Problem # 3:  HYPERLIPIDEMIA (B2193296.4) She will continue her statin and I will have her try and lose weight. Her updated medication list for this problem includes:    Pravachol 40 Mg Tabs (Pravastatin sodium) .[name]... 1 by mouth at bedtime.  Patient Instructions: 1)  Your physician wants you to follow-up in:  6 months with Dr. Lovena Le. You will receive a reminder letter in the mail two months in advance. If you don't receive a letter, please call our office to schedule the follow-up appointment. 2)  Your physician recommends that you continue on your current medications as directed. Please refer to the Current Medication list given to you today.

## 2010-04-03 NOTE — Medication Information (Signed)
Summary: rov/sp   Anticoagulant Therapy  Managed by: Javier Glazier, PharmD Referring MD: Dorris Carnes Supervising MD: Harrington Challenger MD,Larena Ohnemus Indication 1: Atrial Fibrillation (ICD-427.31) Lab Used: [facility] Site: [facility] INR POC 1.7 INR RANGE 2 - 3  Dietary changes: no    Health status changes: no    Bleeding/hemorrhagic complications: no    Recent/future hospitalizations: no    Any changes in medication regimen? no    Recent/future dental: no  Any missed doses?: no       Is patient compliant with meds? yes       Allergies: 1)  * Novacaine  Anticoagulation Management History:      The patient is taking warfarin and comes in today for a routine follow up visit.  Positive risk factors for bleeding include an age of 69 years or older and presence of serious comorbidities.  The bleeding index is 'intermediate risk'.  Positive CHADS2 values include History of HTN and History of Diabetes.  Negative CHADS2 values include Age > 30 years old.  The start date was 03/07/2005.  Her last INR was 3.3 and today's INR is 1.7.  Anticoagulation responsible provider: Harrington Challenger MD,Raine Elsass.  INR POC: 1.7.  Cuvette Lot#: [id number].  Exp: 04/2011.    Anticoagulation Management Assessment/Plan:      The patient's current anticoagulation dose is Coumadin 5 mg tabs: Take as directed by coumadin clinic..  The target INR is 2 - 3.  The next INR is due 04/04/2010.  Anticoagulation instructions were given to patient.  Results were reviewed/authorized by Javier Glazier, PharmD.         Prior Anticoagulation Instructions: INR 2.5  Continue same dose of 1/2 tablet every day except 1 tablet on Monday, Wednesday and Friday.  Recheck INR in 4 weeks.   Current Anticoagulation Instructions: INR 1.7 (2-3)  Take 1 tablet tonight (03/07/2010) and 1 tablet tomorrow (03/08/2010) then return to normal dosing schedule.

## 2010-04-04 ENCOUNTER — Encounter (INDEPENDENT_AMBULATORY_CARE_PROVIDER_SITE_OTHER): Payer: Medicare Other

## 2010-04-04 ENCOUNTER — Encounter: Payer: Self-pay | Admitting: Cardiology

## 2010-04-04 ENCOUNTER — Ambulatory Visit: Admit: 2010-04-04 | Payer: Self-pay

## 2010-04-04 DIAGNOSIS — I4891 Unspecified atrial fibrillation: Secondary | ICD-10-CM

## 2010-04-04 DIAGNOSIS — Z7901 Long term (current) use of anticoagulants: Secondary | ICD-10-CM

## 2010-04-09 NOTE — Medication Information (Signed)
Summary: rov/ewj  Anticoagulant Therapy  Managed by: Tula Nakayama, RN, BSN Referring MD: Dorris Carnes Supervising MD: Stanford Breed MD, Aaron Edelman Indication 1: Atrial Fibrillation (ICD-427.31) Lab Used: [facility] Site: [facility] INR POC 3.3 INR RANGE 2 - 3  Dietary changes: no    Health status changes: no    Bleeding/hemorrhagic complications: no    Recent/future hospitalizations: no    Any changes in medication regimen? no    Recent/future dental: no  Any missed doses?: no       Is patient compliant with meds? yes       Allergies: 1)  * Novacaine  Anticoagulation Management History:      The patient is taking warfarin and comes in today for a routine follow up visit.  Positive risk factors for bleeding include an age of 69 years or older and presence of serious comorbidities.  The bleeding index is 'intermediate risk'.  Positive CHADS2 values include History of HTN and History of Diabetes.  Negative CHADS2 values include Age > 60 years old.  The start date was 03/07/2005.  Her last INR was 1.7.  Anticoagulation responsible provider: Stanford Breed MD, Aaron Edelman.  INR POC: 3.3.  Cuvette Lot#: [id number].  Exp: 03/2011.    Anticoagulation Management Assessment/Plan:      The patient's current anticoagulation dose is Coumadin 5 mg tabs: Take as directed by coumadin clinic..  The target INR is 2 - 3.  The next INR is due 05/02/2010.  Anticoagulation instructions were given to patient.  Results were reviewed/authorized by Tula Nakayama, RN, BSN.  She was notified by Tula Nakayama, RN, BSN.         Prior Anticoagulation Instructions: INR 1.7 (2-3)  Take 1 tablet tonight (03/07/2010) and 1 tablet tomorrow (03/08/2010) then return to normal dosing schedule.  Current Anticoagulation Instructions: INR 3.3 Skip today's dose then resume 2.5mg s everyday except 5mg s on Mondays, Wednesdays and Fridays. Recheck in 4 weeks.

## 2010-04-15 DIAGNOSIS — I4891 Unspecified atrial fibrillation: Secondary | ICD-10-CM

## 2010-04-15 DIAGNOSIS — I4892 Unspecified atrial flutter: Secondary | ICD-10-CM

## 2010-04-15 DIAGNOSIS — Z7901 Long term (current) use of anticoagulants: Secondary | ICD-10-CM | POA: Insufficient documentation

## 2010-05-02 ENCOUNTER — Encounter: Payer: Self-pay | Admitting: Cardiovascular Disease

## 2010-05-02 ENCOUNTER — Encounter (INDEPENDENT_AMBULATORY_CARE_PROVIDER_SITE_OTHER): Payer: Medicare Other

## 2010-05-02 DIAGNOSIS — I4891 Unspecified atrial fibrillation: Secondary | ICD-10-CM

## 2010-05-02 DIAGNOSIS — Z7901 Long term (current) use of anticoagulants: Secondary | ICD-10-CM

## 2010-05-02 LAB — CONVERTED CEMR LAB: POC INR: 4

## 2010-05-05 ENCOUNTER — Encounter: Payer: Self-pay | Admitting: Internal Medicine

## 2010-05-05 ENCOUNTER — Ambulatory Visit (INDEPENDENT_AMBULATORY_CARE_PROVIDER_SITE_OTHER): Payer: Medicare Other | Admitting: Internal Medicine

## 2010-05-05 ENCOUNTER — Ambulatory Visit (INDEPENDENT_AMBULATORY_CARE_PROVIDER_SITE_OTHER)
Admission: RE | Admit: 2010-05-05 | Discharge: 2010-05-05 | Disposition: A | Discharge status: Home / Self Care | Payer: Medicare Other | Source: Ambulatory Visit | Attending: Internal Medicine | Admitting: Internal Medicine

## 2010-05-05 ENCOUNTER — Other Ambulatory Visit: Payer: Self-pay | Admitting: Internal Medicine

## 2010-05-05 DIAGNOSIS — E785 Hyperlipidemia, unspecified: Secondary | ICD-10-CM

## 2010-05-05 DIAGNOSIS — E119 Type 2 diabetes mellitus without complications: Secondary | ICD-10-CM

## 2010-05-05 DIAGNOSIS — R51 Headache: Secondary | ICD-10-CM

## 2010-05-05 LAB — LIPID PANEL: HDL: 43.2 mg/dL (ref >39.00)

## 2010-05-05 LAB — ALT: ALT: 18 U/L (ref 0–35)

## 2010-05-05 LAB — AST: AST: 24 U/L (ref 0–37)

## 2010-05-08 NOTE — Medication Information (Signed)
Summary: rov/tm   Anticoagulant Therapy  Managed by: Tula Nakayama, RN, BSN Referring MD: Dorris Carnes Supervising MD: Stanford Breed MD, Aaron Edelman Indication 1: Atrial Fibrillation (ICD-427.31) Lab Used: [facility] Site: [facility] INR POC 4.0 INR RANGE 2 - 3  Dietary changes: no    Health status changes: no    Bleeding/hemorrhagic complications: no    Recent/future hospitalizations: no    Any changes in medication regimen? no    Recent/future dental: no  Any missed doses?: no       Is patient compliant with meds? yes       Allergies: 1)  * Novacaine  Anticoagulation Management History:      Positive risk factors for bleeding include an age of 26 years or older and presence of serious comorbidities.  The bleeding index is 'intermediate risk'.  Positive CHADS2 values include History of HTN and History of Diabetes.  Negative CHADS2 values include Age > 43 years old.  The start date was 03/07/2005.  Her last INR was 1.7.  Anticoagulation responsible provider: Stanford Breed MD, Aaron Edelman.  INR POC: 4.0.  Cuvette Lot#: [id number].  Exp: 03/2011.    Anticoagulation Management Assessment/Plan:      The patient's current anticoagulation dose is Coumadin 5 mg tabs: Take as directed by coumadin clinic..  The target INR is 2 - 3.  The next INR is due 05/23/2010.  Anticoagulation instructions were given to patient.  Results were reviewed/authorized by Tula Nakayama, RN, BSN.         Prior Anticoagulation Instructions: INR 3.3 Skip today's dose then resume 2.5mg s everyday except 5mg s on Mondays, Wednesdays and Fridays. Recheck in 4 weeks.   Current Anticoagulation Instructions: INR 4.0 Hold coumadin for 2 days (Friday and Saturday) then take 1/2 tablet every day except for Wed and Fri take 1 tablet Recheck INR in 3 weeks.

## 2010-05-13 NOTE — Assessment & Plan Note (Signed)
Summary: rto 3 months/cbs   Vital Signs:  Patient profile:   70 year old female Weight:      228.38 pounds Pulse rate:   82 / minute Pulse rhythm:   regular BP sitting:   134 / 80  (left arm) Cuff size:   regular  Vitals Entered By: [name] CMA (May 05, 2010 9:26 AM) CC: 3 month f/u- fasting  Comments c/o being nauseas, vomitting yesterday HA started yesterday- still has [facility] [address]    History of Present Illness: ROV  here for a followup of her cholesterol, she was switched to Lipitor but later on prescribe Pravachol because it was less expensive. Good tolerance and compliance  Also complains of headache, started yesterday, pain is steady , located on the right side of the head an associated with nausea. This is not the worse  headache of her life but is different from the headache she had a year ago.  has decreased somehow with Tylenol Has a ill defined  visual disturbance  "vision is  slightly off " denies diplopia per se.   ROS No fever No diarrhea No sinus  infection Again no diplopia per se When asked, she admits to "neck stiffness" she is however able to move her neck without problems. Denies any dizziness, slurred speech or focal weaknesses  Current Medications (verified): 1)  Avapro 150 Mg  Tabs (Irbesartan) .[name].. 1 By Mouth Once Daily 2)  Diltiazem Hcl 120 Mg  Tabs (Diltiazem Hcl) .[name].. 1 By Mouth Qd 3)  Asa 4)  Mvi 5)  Oscal 500/200 D-3   Tabs (Calcium-Vitamin D Tabs) 6)  Pravachol 40 Mg Tabs (Pravastatin Sodium) .[name].. 1 By Mouth At Bedtime. 7)  Coumadin 5 Mg Tabs (Warfarin Sodium) .... Take As Directed By Coumadin Clinic. 8)  Humulin N 100 Unit/ml Susp (Insulin Isophane Human) .... 50 Units Am and 25 Units Pm  Allergies (verified): 1)  * Novacaine  Past History:  Past Medical History: DIABETES MELLITUS, w/ NEUROPATHY  RETINOPATHY from laser surgery   right eyelid  weakness after cataract surgery  left eye blindness HYPERLIPIDEMIA    HYPERTENSION   ATRIAL FLUTTER, PAROXYSMAL  used to see Dr Lovena Le  LUNG NODULE EXCISION 2008, bX BENIGN  OSTEOPENIA?, DEXA 2004 showed osteopenia, DEXA  03-2007 normal ? of COMMON MIGRAINE    Physical Exam  General:  alert and well-developed.   no apparent distress Neck:  full ROM.   nontender to palpation of the cervical spine Lungs:  normal respiratory effort, no intercostal retractions, no accessory muscle use, and normal breath sounds.   Heart:  normal rate, regular rhythm, no murmur, and no gallop.   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     Impression & Recommendations:  Problem # 1:  HEADACHE (ICD-784.0)  presents with a one-day history of headache and nausea, this is different from her previous headache a year ago. She is on Coumadin and reportedly has some neck stiffness although  the exam is negative. She also has a ill-defined visual disturbance. Plan: CT of the head,  further advice with results Orders: Radiology Referral (Radiology)  Problem # 2:  DIABETES MELLITUS, TYPE II (ICD-250.00)  per Endocrinology Her updated medication list for this problem includes:    Avapro 150 Mg Tabs (Irbesartan) .[name]... 1 by mouth once  daily    Humulin N 100 Unit/ml Susp (Insulin isophane human) .Marland KitchenMarland KitchenMarland KitchenMarland Kitchen 50 units am and 25 units pm  Labs Reviewed: Creat: 1.1 (02/03/2010)    Reviewed HgBA1c results: 8.4 (01/30/2010)  8.8 (10/08/2009)  Problem # 3:  HYPERLIPIDEMIA (ICD-272.4) labs  , we'll see how she's doing on Pravachol Her updated medication list for this problem includes:    Pravachol 40 Mg Tabs (Pravastatin sodium) .[name]... 1 by mouth at bedtime.  Labs Reviewed: SGOT: 21 (02/03/2010)   SGPT: 14 (02/03/2010)   HDL:46.70 (02/03/2010), 47.80 (10/01/2008)  LDL:99 (02/03/2010), 76 (10/01/2008)  Chol:162 (02/03/2010),  140 (10/01/2008)  Trig:83.0 (02/03/2010), 79.0 (10/01/2008)  Orders: Venipuncture [id number]) TLB-ALT (SGPT) (84460-ALT) TLB-AST (SGOT) (84450-SGOT) TLB-Lipid Panel (80061-LIPID) Specimen Handling (99000)  Complete Medication List: 1)  Avapro 150 Mg Tabs (Irbesartan) .[name].. 1 by mouth once daily 2)  Diltiazem Hcl 120 Mg Tabs (Diltiazem hcl) .[name].. 1 by mouth qd 3)  Asa  4)  Mvi  5)  Oscal 500/200 D-3 Tabs (Calcium-vitamin d tabs) 6)  Pravachol 40 Mg Tabs (Pravastatin sodium) .[name].. 1 by mouth at bedtime. 7)  Coumadin 5 Mg Tabs (Warfarin sodium) .... Take as directed by coumadin clinic. 8)  Humulin N 100 Unit/ml Susp (Insulin isophane human) .... 50 units am and 25 units pm  Patient Instructions: 1)   if your headache gets  worse, let me know 2)  Please schedule a follow-up appointment in 4 months .    Orders Added: 1)  Venipuncture [id number] 2)  TLB-ALT (SGPT) [84460-ALT] 3)  TLB-AST (SGOT) [84450-SGOT] 4)  TLB-Lipid Panel [80061-LIPID] 5)  Radiology Referral [Radiology] 6)  Specimen Handling [id number] 7)  Est. Patient Level IV [id number]

## 2010-05-13 NOTE — Letter (Signed)
Summary: M/M [facility]  M/M [facility]   Imported By: Laural Benes 05/08/2010 12:09:17  _____________________________________________________________________  External Attachment:    Type:   Image     Comment:   External Document

## 2010-05-23 ENCOUNTER — Ambulatory Visit (INDEPENDENT_AMBULATORY_CARE_PROVIDER_SITE_OTHER): Payer: Medicare Other | Admitting: *Deleted

## 2010-05-23 DIAGNOSIS — Z7901 Long term (current) use of anticoagulants: Secondary | ICD-10-CM

## 2010-05-23 DIAGNOSIS — I4892 Unspecified atrial flutter: Secondary | ICD-10-CM

## 2010-05-23 DIAGNOSIS — I4891 Unspecified atrial fibrillation: Secondary | ICD-10-CM

## 2010-05-23 LAB — POCT INR: INR: 3.2

## 2010-05-23 NOTE — Patient Instructions (Signed)
INR 3.2 Today, take 1/2 tablet.  Then, resume taking 1/2 tablet (2.5mg ) daily, except take 1 tablet on Mondays and Fridays. Recheck in 4 weeks.

## 2010-05-26 ENCOUNTER — Ambulatory Visit (INDEPENDENT_AMBULATORY_CARE_PROVIDER_SITE_OTHER): Payer: Medicare Other | Admitting: Endocrinology

## 2010-05-26 ENCOUNTER — Other Ambulatory Visit (INDEPENDENT_AMBULATORY_CARE_PROVIDER_SITE_OTHER): Payer: Medicare Other

## 2010-05-26 ENCOUNTER — Encounter: Payer: Self-pay | Admitting: Endocrinology

## 2010-05-26 VITALS — BP 122/74 | HR 66 | Temp 98.1°F | Ht 69.0 in | Wt 231.8 lb

## 2010-05-26 DIAGNOSIS — E119 Type 2 diabetes mellitus without complications: Secondary | ICD-10-CM

## 2010-05-26 LAB — HEMOGLOBIN A1C: Hgb A1c MFr Bld: 9 % — ABNORMAL HIGH (ref 4.6–6.5)

## 2010-05-26 MED ORDER — INSULIN NPH (HUMAN) (ISOPHANE) 100 UNIT/ML ~~LOC~~ SUSP: SUBCUTANEOUS | Status: DC

## 2010-05-26 NOTE — Patient Instructions (Addendum)
blood tests are being ordered for you today.  please call [phone] to hear your test results. pending the test results, please change your insulin to 70 units each morning, and 15 units each evening. Please reuturn here in 4 month

## 2010-05-26 NOTE — Progress Notes (Signed)
  Subjective:    Patient ID: [name] [name], female    DOB: 1941/02/05, 70 y.o.   MRN: [id number]  HPI pt states she feels well in general.  She says the cost of a medication is very important to her.  she brings a record of her cbg's which i have reviewed today.  It varies from the 60's (am) to 200's (afternoon).   Review of Systems No weight change    Objective:   Physical Exam GENERAL: no distress  Obese.   Neck:  Thyroid is not enlarged.         Assessment & Plan:  Dm, therapy limited by pt's request for least expensive meds, and by her need for a simple insulin regimen.

## 2010-06-19 ENCOUNTER — Ambulatory Visit (INDEPENDENT_AMBULATORY_CARE_PROVIDER_SITE_OTHER): Payer: Medicare Other | Admitting: *Deleted

## 2010-06-19 DIAGNOSIS — Z7901 Long term (current) use of anticoagulants: Secondary | ICD-10-CM

## 2010-06-19 DIAGNOSIS — I4892 Unspecified atrial flutter: Secondary | ICD-10-CM

## 2010-06-19 DIAGNOSIS — I4891 Unspecified atrial fibrillation: Secondary | ICD-10-CM

## 2010-07-15 NOTE — Assessment & Plan Note (Signed)
[facility] OFFICE NOTE   NAME:Kazmierczak, CARRIE-ANN INABNIT                    MRN:          [id number]  DATE:03/08/2007                            DOB:          10/27/1940    Ms. Doornbos returns today for follow-up. She is a very pleasant woman  with a history of flutter status post ablation, history of obesity,  history of atrial fibrillation and a recent removal of a large  bronchogenic cyst.  She returns today for follow-up and overall has done  well.  She notes that in the last several months she has had 3 or 4  episodes of a fib typically lasting 5-10 minutes at a time and no  longer.  She had no other specific complaints today.   MEDICATIONS:  1. Vytorin 10/40.  2. Actos 15 a day.  3. Warfarin as directed.  4. Avapro 150 mg daily.  5. Diltiazem 120 mg daily.  6. Insulin 70/30 as directed.   PHYSICAL EXAM:  She is a pleasant, well-appearing, obese, middle-aged  woman in no distress.  Blood pressure was 153/73, the pulse 72 and regular, respirations were  18.  Weight was 219 pounds.  NECK:  Revealed no jugular distention.  LUNGS:  Clear bilaterally to auscultation. No wheezes, rales or rhonchi  are present.  CARDIOVASCULAR:  Regular rate and rhythm with normal S1 and S2.  EXTREMITIES:  Demonstrate no edema.   EKG demonstrates sinus rhythm with PACs.   IMPRESSION:  1. Paroxysmal atrial fibrillation.  2. Chronic Coumadin therapy.  3. History of atrial flutter status post ablation.  4. Obesity.   DISCUSSION:  Overall Ms. Medley is stable.  Her A fib is well  controlled.  We will see her back in the office in 6 months.     Champ Mungo. Lovena Le, MD  Electronically Signed    GWT/MedQ  DD: 03/08/2007  DT: 03/09/2007  Job #: [id number]

## 2010-07-15 NOTE — Assessment & Plan Note (Signed)
[facility] OFFICE NOTE   NAME:[name], [name] THRONE                    MRN:          [id number]  DATE:04/05/2008                            DOB:          09/16/1940    [name] [name] returns today for followup.  She is a very pleasant 70-year-  old woman with a history of paroxysmal atrial fibrillation,  hypertension, and obesity.  Her palpitations and AFib have been very  nicely controlled over the last several months.  She relates that she  fell and broke her shoulder back in May or perhaps June after her dog  resulted in her falling.  Surgery was not required fortunately, and she  has her healed very nicely with very minimal loss and range of motion.  She returns today for followup.  She denies chest pain.  She denies  shortness of breath.  She notes that she has been somewhat sedentary  since the weather has been cold, but prior to that was walking  regularly.   CURRENT MEDICATIONS:  1. Actos 15 a day.  2. Warfarin as directed.  3. Avapro 150 a day.  4. Diltiazem 120 a day.  5. Aspirin 325 a day.  6. Insulin 70/30 as directed.  7. Simvastatin 80 a day.   PHYSICAL EXAMINATION:  GENERAL:  She is a pleasant well-appearing woman  in no acute distress.  VITAL SIGNS:  The blood pressure was 116/70, the pulse was 66 and  regular, the respirations were 18, the weight was 221 pounds, up 2  pounds from our visit last year.  NECK:  No jugular venous distention.  LUNGS:  Clear bilaterally to auscultation.  No wheezes, rales, or  rhonchi are present.  There is no increased work of breathing.  CARDIOVASCULAR:  Regular rate and rhythm.  Normal S1 and S2.  ABDOMEN:  Soft and nontender.  No organomegaly.  EXTREMITIES:  No cyanosis, clubbing, or edema.  Peripheral pulses are 2+  and symmetric.   Her EKG demonstrates sinus rhythm with normal axis and intervals.   IMPRESSION:  1. Symptomatic atrial fibrillation, now  well controlled and only      paroxysmal with a very minimal symptoms.  2. Hypertension.  3. Dyslipidemia.   DISCUSSION:  [name] is stable.  Her blood pressure is controlled.  Her AFib is controlled.  We will continue her current medical therapy.  We will continue her on Coumadin.  I will see her back as needed.     Champ Mungo. Lovena Le, MD  Electronically Signed    GWT/MedQ  DD: 04/05/2008  DT: 04/06/2008  Job #: [id number]

## 2010-07-15 NOTE — Consult Note (Signed)
NAME:  [name] [name], [name] [name]             ACCOUNT NO.:  [id number]   MEDICAL RECORD NO.:  [id number]          PATIENT TYPE:  INP   LOCATION:  [id number]                         FACILITY:  [facility]   PHYSICIAN:  Fay Records, MD, FACCDATE OF BIRTH:  1940/12/26   DATE OF CONSULTATION:  07/30/2006  DATE OF DISCHARGE:                                 CONSULTATION   IDENTIFICATION:  The patient is a 70 year old who we are asked to see  regarding atrial arrhythmias.   HISTORY OF PRESENT ILLNESS:  The patient was admitted on Jul 27, 2006  for resection of a right paratracheal mass and VATs procedure.  She had  a URI in March.  Chest x-ray was abnormal.  CT showing mass and workup  from there.   The patient denies palpitations, no shortness of breath, no chest pain.   MEDICATIONS ON ADMISSION:  1. Include Coumadin.  2. Vytorin 10/40.  3. Actos 45.  4. Metoprolol 50 b.i.d.  5. Avapro 150 a day.  6. Aspirin 325 daily.  7. Insulin.  8. Os-Cal.   HERE IN THE HOSPITAL:  1. Vytorin question 10/20 versus 10/40  2. Actos 45.  3. Coumadin just started.  4. Insulin.  5. Avapro 150.  6. Lopressor initially 50 b.i.d. decreased to 25 b.i.d.  7. Lasix x1.   ALLERGIES:  NOVOCAINE.   PAST MEDICAL HISTORY:  1. Mild CAD, cath in January 2007 with 25% LAD x2 proximally, 30% mid-      circumflex normal, RCA dominant and normal, EF of 60%.  2. Paroxysmal atrial fibrillation.  3. Atrial flutter, status post ablation in March 2007.  4. Retinopathy.  5. Neuropathy.  6. Diabetes x19 years.  7. Hypertension.  8. Obesity.  9. Dyslipidemia.  10.Bronchogenic.   SOCIAL HISTORY:  Remote tobacco, minus EtOH.  Married.   FAMILY HISTORY:  Mother died suddenly at 52.  Father died at 72 of an  MI.   REVIEW OF SYSTEMS:  All systems reviewed negative to the above problem  except as noted above.   PHYSICAL EXAM:  On exam, the patient is currently in no distress sitting  in a chair.  Denies shortness of breath.   Blood pressures today have  been A999333 systolic over 0000000, pulses in the 60s to 80s.  Temperature  is 99.  HEENT:  Normocephalic, atraumatic.  EOMI.  PERRL.  Throat clear.  NECK:  Right IJ.  No bruits.  LUNGS:  Clear to auscultation, status post chest tube removal with  bandage in the right base.  CARDIAC EXAM:  Regular rate and rhythm S1-S2.  No S3-S4 or murmurs.  ABDOMEN:  Supple, nontender.  EXTREMITIES:  2+ pulses, no edema.   A 12-lead EKG sinus bradycardia 50.  Telemetry sinus rhythm with PACs  and atrial bigeminy.  Labs significant for troponin 0.2.  CK 833 with an  MB of 19.1, giving a fraction of 2.3, hemoglobin 9.7, WBC of 10.9.  Normal MCV, MCHC, platelets of 204.  BUN and creatinine of 17 and 1,  potassium of 4.1, albumin 2.7.  Pathology benign cyst.  Chest x-ray:  Moderate  edema on Jul 30, 2006, atelectasis.  Note lipid panel in March  2008 total cholesterol 123, LDL of 59, HDL 44, triglycerides 101.   IMPRESSION:  The patient is a 70 year old woman with history of atrial  fibrillation intermittent, atrial flutter status post ablation, mild  coronary artery disease on chronic Coumadin who is now status post  removal of a mass which is a benign cyst.  Hemodynamically postop has  been good.  Note diastolic blood pressure has been slow.  Systolics have  climbed.  Telemetry with premature atrial contractures, atrial bigeminy.  On exam volume status looks good.  Blood pressure now slightly high,  again systolics.   RECOMMEND:  1. Increasing Lopressor to 25 t.i.d.  Follow blood pressure, heart      rate.  Increase to 50 b.i.d. as tolerated.  2. Continue Avapro.  3. Continue telemetry.  4. Coumadin being resumed.  5. Check BMET.  6. Will continue to follow.  7. Dyslipidemia.  Change Vytorin to 10/40.  CAD stable.      Fay Records, MD, [facility]  Electronically Signed     PVR/MEDQ  D:  07/30/2006  T:  07/31/2006  Job:  [id number]

## 2010-07-15 NOTE — H&P (Signed)
NAME:  [name] [name], [name] [name]             ACCOUNT NO.:  [id number]   MEDICAL RECORD NO.:  [id number]          PATIENT TYPE:  INP   LOCATION:  NA                           FACILITY:  [facility]   PHYSICIAN:  Nicanor Alcon, M.D. DATE OF BIRTH:  Jan 08, 1941   DATE OF ADMISSION:  DATE OF DISCHARGE:                              HISTORY & PHYSICAL   CHIEF COMPLAINT:  Lung mass.   HISTORY OF PRESENT ILLNESS:  This 70 year old patient has a history of  atrial fibrillation, and had an ablation by Dr. Lovena Le.  Has also had  some atrial fibrillation, and she is presently on Coumadin.  She also  has hypertension, diabetes and obesity.  A chest x-ray was obtained  because of some shortness of breath, showed an area of tracheal  bronchitis, but also showed a right peritracheal mass.  CT scan showed a  right peritracheal mass with compression of the distal take-off at the  right main stem bronchus.  This  is thought to be a possible  bronchiogenic cyst, but had increased in size over the last 2 years at  least judging from chest x-rays.  She also has two 5 mm nodules in the  right middle lobe.  The density of the area, I thought this could  possibly be some type of malignant tumor.  She has had no hemoptysis,  fevers or chills.  No evidence of wheezing.  Pulmonary function test  showed an FVC of 2.07 with FEV1 of 1.51.   MEDICATIONS:  1. Insulin 30 units in the morning and 20 units at night.  2. Vytorin 10/20 daily.  3. Coumadin 5/7.5 every other day.  4. Metoprolol 50 mg twice a day.  5. Avapro 120 mg daily.  6. Os-Cal.   ALLERGIES:  She is allergic to NOVOCAIN.   PAST SURGICAL HISTORY:  Bilateral eye surgery for cataracts.   FAMILY HISTORY:  Noncontributory.   SOCIAL HISTORY:  She is married, one child.  He is retired.  Quit  smoking 25 years ago.  Does not drink alcohol on a regular basis.   REVIEW OF SYSTEMS:  Her weight has been stable.  She is 225 pounds.  She  is 5 feet 10 inches.   Cardiac:  See history of present illness.  No  angina.  Pulmonary:  See history of present illness.  She had no nausea,  vomiting, constipation or diarrhea.  GU:  No dysuria, frequent urination  or kidney disease.  Vascular:  No claudication, DVT, TIAs.  Neurological:  No headaches __________.  Musculoskeletal:  She had groin  pains and arthritis.  Psychiatric:  No psychiatric illness __________ no  change in her hearing.  See past medical history.  Hematological:  No  problems with bleeding or clotting disorder or anemia.   PHYSICAL EXAMINATION:  VITAL SIGNS:  Her blood pressure is 148/78, pulse  50, respirations 18, sat 98%  HEENT:  Head is atraumatic.  Pupils are equal and react to light and  accommodation.  Extraocular movements are normal.  Ears:  Tympanic  membranes are intact.  Nose:  There is no septal deviation.  Throat:  The uvula is in midline.  There is no lesion.  NECK:  Supple without thyromegaly.  There is supraclavicular or axillary  adenopathy.  CHEST:  Clear to auscultation and percussion.  I did not hear any  wheezing.  HEART:  At present, there is regular sinus rhythm with no murmur.  ABDOMEN:  Soft.  There is no hepatosplenomegaly.  Bowel sounds are  normal.  EXTREMITIES:  Pulses are 2+.  There is no clubbing or edema.  NEUROLOGICAL:  She is oriented x3.  Sensory and motor intact.  Cranial  nerves are intact.  SKIN:  Without lesion.   IMPRESSION:  1. 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  2. History of atrial flutter and atrial fibrillation.  3. History of cataract surgery.  4. Diabetes mellitus.  5. Hypertension.  6. Moderate obesity.   PLAN:  Right VATS and resection of right peritracheal mass.      Nicanor Alcon, M.D.  Electronically Signed     DPB/MEDQ  D:  07/24/2006  T:  07/24/2006  Job:  [id number]

## 2010-07-15 NOTE — Assessment & Plan Note (Signed)
OFFICE VISIT   THERMA, RIGHTER  DOB:  09/08/1940                                        August 31, 2006  CHART #:  [id number]   Blood pressure is 141/76, pulse 81, respirations 18 and SATs are 96%.  The lungs are clear to auscultation and percussion.   Chest x-ray is stable and shows normal postoperative change.   She is still having some mild posterior post-thoracotomy pain, but  otherwise is doing well.  I released her to full activities.  We will  see her back again in 2 months with a chest x-ray for the last check.   Nicanor Alcon, M.D.  Electronically Signed   DPB/MEDQ  D:  08/31/2006  T:  09/01/2006  Job:  [id number]

## 2010-07-15 NOTE — Discharge Summary (Addendum)
NAME:  [name] [name], [name] [name]             ACCOUNT NO.:  [id number]   MEDICAL RECORD NO.:  [id number]          PATIENT TYPE:  INP   LOCATION:  3309                         FACILITY:  [facility]   PHYSICIAN:  Nicanor Alcon, M.D. DATE OF BIRTH:  [birth year]   DATE OF ADMISSION:  07/27/2006  DATE OF DISCHARGE:                               DISCHARGE SUMMARY   PRIMARY DIAGNOSIS:  Right paratracheal mass, benign bronchogenic cyst.   IN HOSPITAL DIAGNOSES:  1. Tachybrady syndrome.  2. Postoperative pulmonary edema.   SECONDARY DIAGNOSES:  1. Diabetes mellitus insulin-dependent.  2. Paroxysmal atrial fibrillation status post ablation March 2007.  3. Retinopathy.  4. Neuropathy.  5. Hypertension.  6. Dyslipidemia.  7. Mild coronary artery disease with catheterization January 2007.  8. Status post bilateral cataract surgery.   IN HOSPITAL OPERATIONS/PROCEDURES:  Right thoracotomy with resection of  paratracheal mass.   ____ QA MARKER: 9 ____ HOSPI  old female who has a history of atrial fibrillation status post ablation  by Dr. Lovena Le.  She is presently on Coumadin.  The patient also has a  past medical history for hypertension, diabetes mellitus.  Chest x-ray  obtained because of some shortness of breath showed an area of tracheal  bronchitis.  Also showed a right paratracheal mass.  CT scan showed a  right paratracheal massive compression with a distal takeoff of the  right main stem bronchus.  This is thought to be possible bronchogenic  cyst with an increase in size over the last two years.  The patient also  has two 5 mm nodules in the right middle lobe.  Pulmonary function tests  obtained showed an FVC 2.07 with an FEV-1 of 1.51.  The patient was seen  and evaluated by Dr. Arlyce Dice.  Dr. Arlyce Dice discussed with the patient  undergoing resection of this paratracheal mass.  Discussed risks and  benefits with the patient.  The patient nods her understanding and  agreed to proceed.  Surgery  was scheduled for Jul 27, 2006.  For details  of the patient's Past Medical History and Physical Exam, please see  dictated H&P.   The patient was taken to the operating room Jul 27, 2006 where she  underwent right thoracotomy with resection of paratracheal mass.  Frozen  came back showing bronchiogenic cyst.  The patient tolerated this  procedure.  Was transferred to the intensive care unit in stable  condition.  Postoperative chest x-ray did show mild pulmonary edema.  The patient was given several doses of Lasix.  Chest x-rays were  obtained daily.  Pulmonary edema was improving.  Anterior tube DC'd  postop day #1 with remaining chest tube DC'd postop day #2 with  remaining chest tube DC'd postop day #2.  No pneumothorax noted post DC  chest tube.  The patient was out of bed ambulating well postoperatively.  Vital signs noted to be stable.  She is afebrile.  Sats greater than 90%  on room air.  Postop day #3 the patient was experiencing some tachybrady  syndrome.  The patient does have history of atrial fibrillation and is  on Coumadin.  Cardiology was consulted.  The patient seen and evaluated  by [facility] postop day #3.  At that time recommended  increasing Lopressor and continue current medications and management.  Post increasing Lopressor the patient still had a bradycardic episode.  Lopressor was then placed on hold, and Cardizem was started.  Following  initiation of Cardizem the patient's heart rate stabilized.  The patient  did have a bout of atrial fibrillation on June 2, but converted back to  normal sinus rhythm.  ______________evaluated the patient on June 2, and  recommended continue monitor.  The patient remained in normal sinus  rhythm over the next 24 hours.  Her heart rate remained stable.  AP at  that time felt that the patient was stable from their standpoint for  discharge home.  Recommended continuing Cardizem 120 mg daily and follow  up with Dr. Lovena Le  in three weeks.  Prior to discharge home the  patient's pulmonary status was stable.  She was in normal sinus rhythm.  Incisions were clean, dry, and intact and healing well.  Her vital signs  were stable.  She was afebrile, sats greater than 90% on room air.  The  patient continued to ambulate well without difficulty.  She was  tolerating diet well.  No nausea, vomiting noted.  Final pathology  report came back showing benign bronchogenic cyst.   The patient planned discharge today, August 03, 2006, stable condition.   FOLLOW-UP APPOINTMENTS:  Follow-up appointment has been arranged with  Dr. Arlyce Dice for August 10, 2006 at 3:15 p.m.  The patient will need to  obtain a PMI or chest x-ray one hour prior to this appointment.  An  appointment has been arranged for Dr. Lovena Le for September 02, 2006 at 2:45  p.m..  The patient will need to obtain PT/INR blood work by [facility] in the next 2-3 days post discharge.   ACTIVITY:  Patient instructed no driving until released to do so, no  heavy lifting over 10 pounds.  She is told to ambulate 3-4 times per  day, progress as tolerated, and to continue her breathing exercises.   INCISIONAL CARE:  The patient is told to shower washing her incisions  using soap and water.  She is to contact the office if she develops any  drainage or opening from any of her incision sites.  Plan staple removal  and suture removal the day of appointment with Dr. Arlyce Dice.   DIET:  The patient again on diet to be low fat, low salt.   DISCHARGE MEDICATIONS:  1. Cardizem 120 mg daily.  2. Percocet 5/325 1-2 tabs q.4-6 h. p.r.n.  3. Coumadin 5 mg at night.  Will be followed by Dr. Harrington Challenger at [facility].  4. Vytorin 10/40 daily.  5. Actos 45 mg daily.  6. Avapro 150 mg daily.  7. Aspirin 325 mg daily.  8. Os-Cal daily. 9. Insulin 70/30 34 units in a.m. with 22 units in p.m..      Darlin Coco, [place]      Nicanor Alcon, M.D.   Electronically Signed    KMD/MEDQ  D:  08/03/2006  T:  08/03/2006  Job:  [id number]   cc:   Lovena Le, M.D.  Harrington Challenger, M.D.

## 2010-07-15 NOTE — Letter (Signed)
August 10, 2006   [name] [name]. Melvyn Novas, MD, FCCP  [address] [address]   Re:  [name], [name]             DOB:  Oct 16, 1940   Dear Ronalee Belts,   I saw [name] back today after we had resected her bronchogenic cyst  with a right thoracotomy.  She is swallowing well, doing well.  Her  incisions are well healed.  Her blood pressure is 122/65, pulse 80,  respirations 18, sats are 97%.   Plan to see her back again in four weeks with a chest x-ray.  Told her  she could gradually increase her activities.   Nicanor Alcon, M.D.  Electronically Signed   DPB/MEDQ  D:  08/10/2006  T:  08/11/2006  Job:  [id number]

## 2010-07-15 NOTE — Assessment & Plan Note (Signed)
OFFICE VISIT   CAMARI, SCHWANDT  DOB:  1940/12/23                                        December 01, 2006  CHART #:  [id number]   The patient returns today after a right thoracotomy for an excision of a  large bronchogenic cyst.  She is doing well.  Chest x-ray shows no  evidence of recurrence.  She has a recent upper respiratory infection,  but otherwise is doing well.  Her blood pressure is 118/64, pulse 69,  respirations 18, sats were 96%.   Nicanor Alcon, M.D.  Electronically Signed   DPB/MEDQ  D:  12/01/2006  T:  12/01/2006  Job:  [id number]

## 2010-07-15 NOTE — Consult Note (Signed)
[facility]   NAME:[name] [name], [name] [name]                    MRN:          [id number]  DATE:10/07/2006                            DOB:          04-06-1940    REFERRING PHYSICIAN:  [name], MD   REASON FOR REFERRAL:  Diabetes.   HISTORY OF PRESENT ILLNESS:  A 70 year old woman who reports a 20 year  history of diabetes. She has been on insulin for 15 years. She is  unaware of any chronic complications of diabetes. She currently takes  70/30 insulin 34 units q.a.m. and 22 units q.p.m. She describes her diet  as fair, and her exercise is poor. Symptomatically, she has slight  weight gain over the past 6 months or a year but no associated numbness  of the feet.   PAST MEDICAL HISTORY:  1. Dyslipidemia.  2. Hypertension.  3. Atrial fibrillation and flutter.   SOCIAL HISTORY:  She is here with her husband, she is retired.   FAMILY HISTORY:  Positive for diabetes in her father and 2 siblings.   REVIEW OF SYSTEMS:  She has frequent hypoglycemia before breakfast or in  the middle of the night. She denies shortness of breath, nausea or  vomiting.   PHYSICAL EXAMINATION:  VITAL SIGNS:  Blood pressure is 117/57, heart  rate is 73, temperature is 97.4, the weight is 226.  GENERAL:  Obese.  SKIN:  No rash. Not diaphoretic.  EYES:  No proptosis. No periorbital swelling.  PHARYNX:  Normal.  NECK:  No goiter.  CHEST:  Clear to auscultation. No respiratory distress.  CARDIOVASCULAR:  No edema. Regular rate and rhythm, no murmur. Pedal  pulses are intact and the carotid arteries have no bruit.  FEET:  Normal color and temperature. There is no ulcer present on the  feet.  NEUROLOGIC:  Alert and oriented. Does not appear anxious nor depressed  and there is no tremor.   LABORATORY DATA:  On September 20, 2006, hemoglobin A1c 8.1. Urine  microalbumin is positive.   IMPRESSION:  1. Diabetes of uncertain type. I  agree with Dr. Larose Kells that she needs      adjustment in her therapy.  2. Diabetic nephropathy which would benefit from aggressive control of      her diabetes.  3. Well controlled hypertension and dyslipidemia.   PLAN:  1. We discussed the risk of diabetes and the importance of diet and      exercise therapy.  2. We discussed the fact that on one hand her A1c is elevated. On the      other hand, she is having hypoglycemia. I discussed with her the      fact that this is due to mismatching of insulin in her body to her      requirements and we will try changing her insulin to Humalog 10      breakfast, 12 lunch, 18 supper.  3. Return in a week or two.  4. Continue aggressive therapy for hypertension and dyslipidemia, as      Dr. Larose Kells is doing.     Sean A.  [name], MD  Electronically Signed    SAE/MedQ  DD: 10/11/2006  DT: 10/12/2006  Job #: [id number]   cc:   [name], MD

## 2010-07-15 NOTE — Assessment & Plan Note (Signed)
[facility] OFFICE NOTE   NAME:Lagman, BRANDYCE SALMI                    MRN:          [id number]  DATE:09/21/2006                            DOB:          June 09, 1940    SUBJECTIVE:  Ms. Steve returns today for followup.  She is a very  pleasant middle-aged woman with morbid obesity and atrial flutter,  status post ablation, who underwent removal of a bronchogenic cyst which  was much larger than expected and postoperatively developed both atrial  fibrillation, as well as bradycardia.  She ultimately improved and  returns today for followup.  Denies chest pain or shortness of breath.  Has had no recurrent palpitations since she was discharged from the  hospital.   PHYSICAL EXAMINATION:  GENERAL:  She is a pleasant, obese middle-aged  woman, in no acute distress.  VITAL SIGNS:  Blood pressure today 145/76, pulse 68 and regular,  respirations 18, weight 221 pounds.  NECK:  No jugular venous distention.  LUNGS:  Clear bilaterally to auscultation.  No wheezes, rales or  rhonchi.  CARDIOVASCULAR:  A regular rate and rhythm with normal S1 and S2.  EXTREMITIES:  Demonstrate no clubbing, cyanosis or edema.  Pulses are 2+  and symmetric.   The electrocardiogram demonstrates a sinus rhythm with normal axis and  intervals.   IMPRESSION:  1. Postoperative atrial fibrillation.  2. Atrial flutter, status post ablation.  3. Morbid obesity.  4. Hypertension.   DISCUSSION:  Overall Ms. Biven is stable and her atrial fibrillation is  maintaining sinus rhythm for now.  I have asked that she continue her  present medications which include insulin, Vytorin, Actos, Warfarin,  Avapro and Diltiazem.  I have asked that she try to start walking on a  regular basis.  Hopefully she can lose some weight.   FOLLOWUP:  I will see her back in six months.     Champ Mungo. Lovena Le, MD  Electronically Signed    GWT/MedQ  DD:  09/21/2006  DT: 09/22/2006  Job #: [id number]   cc:   [name], MD

## 2010-07-15 NOTE — Op Note (Signed)
NAME:  [name] [name], [name] [name]             ACCOUNT NO.:  [id number]   MEDICAL RECORD NO.:  [id number]          PATIENT TYPE:  INP   LOCATION:  2301                         FACILITY:  [facility]   PHYSICIAN:  Nicanor Alcon, M.D. DATE OF BIRTH:  1940/07/15   DATE OF PROCEDURE:  DATE OF DISCHARGE:                               OPERATIVE REPORT   PREOPERATIVE DIAGNOSIS:  Right paratracheal mass.   POSTOPERATIVE DIAGNOSIS:  Bronchogenic cyst, right paratracheal mass.   SURGEON:  Nicanor Alcon, M.D.   ASSISTANT:  Mr. Glennon Mac [facility]   ANESTHESIA:  General anesthesia.   DESCRIPTION OF PROCEDURE:  After the percutaneous insertion of all  monitor lines, the patient turned in the right lateral thoracotomy  position.  This patient had an enlarging mass in the right peritrachea.  We thought this was a bronchogenic cyst.  It was causing tracheal  deviation.  He underwent general anesthesia, was turned to the right  lateral thoracotomy position. Dual lumen tube was inserted.  The right  lung was attempted to be deflated.  She was prepped and draped in the  usual standard manner.  Two trocar sites were made in the anterior and  posterior axillary line at the seventh intercostal spas.  Two trocars  were inserted. Unfortunately the lung could not be deflated enough in  order to see the area so we had to proceed on with the posterior lateral  thoracotomy and dividing the latissimus and reflecting the serratus  anteriorly and entering the fifth intercostal space.  A __________ was  placed intercostal space.  We were able then to reflect the lung with  retractors enough to see the cyst and take pictures but the dissection  was started by the azygos vein, dividing dissecting off the azygos vein.  There were 2 tributaries to the azygos vein that had to be clipped and  divided and one of them oversewn with 4-0 Prolene.  The cyst was between  the esophagus and the distal trachea and we first divided the pleura on  top of the cyst and started inferiorly, dissected the cyst up off the  esophagus using sharp dissection.  After we dissected off the esophagus,  we then dissected off superiorly and then dissected it off the trachea.  The cyst was sent for frozen section which revealed a bronchogenic type  cyst.  There was extremely a lot of scar tissue there so we decided, we  would close the esophagus with interrupted 4-0 Prolene and then put a  pleura flap between it.  Two chest tubes were placed in the esophagus  and then the chest was closed in layers with pericostal and the #1  pericostal and #1 Vicryl in the muscle and 2-0 Vicryl in the  subcutaneous tissue.  Marcaine block was done and On-Q was inserted in  the usual fashion.  The patient returned to the recovery room in stable  condition.      Nicanor Alcon, M.D.  Electronically Signed     DPB/MEDQ  D:  07/27/2006  T:  07/27/2006  Job:  [id number]

## 2010-07-17 ENCOUNTER — Ambulatory Visit (INDEPENDENT_AMBULATORY_CARE_PROVIDER_SITE_OTHER): Payer: Medicare Other | Admitting: *Deleted

## 2010-07-17 DIAGNOSIS — Z7901 Long term (current) use of anticoagulants: Secondary | ICD-10-CM

## 2010-07-17 DIAGNOSIS — I4891 Unspecified atrial fibrillation: Secondary | ICD-10-CM

## 2010-07-17 DIAGNOSIS — I4892 Unspecified atrial flutter: Secondary | ICD-10-CM

## 2010-07-17 MED ORDER — WARFARIN SODIUM 5 MG PO TABS: 5.0000 mg | ORAL_TABLET | ORAL | Status: DC

## 2010-07-18 NOTE — Assessment & Plan Note (Signed)
[facility] OFFICE NOTE   NAME:[name], [name] TEATS                    MRN:          [id number]  DATE:12/25/2005                            DOB:          January 12, 1941    [name] [name] returns today for followup.  She is a very pleasant middle-aged  obese woman with a history of atrial flutter, status post ablation, who has  had a remote and brief episode of atrial fibrillation documented on cardiac  monitoring.  She underwent catheter ablation several months ago and has had  no recurrent symptoms of atrial arrhythmias.  However, because of her  episode of atrial fibrillation, I have been reluctant to stop her Coumadin.  She notes that she has been under increased stress lately, with her son  being ill in the hospital and her grandson being in an automobile accident.  However, despite this, she has had no recurrent palpitations.  She denies  chest pain or shortness of breath.   PHYSICAL EXAMINATION:  GENERAL:  She is a pleasant, well-appearing, middle-  aged woman in no acute distress.  VITAL SIGNS:  Blood pressure 160/80, pulse 54 and regular, respirations 18,  weight 232 pounds.  NECK:  Revealed no jugular venous distention.  LUNGS:  Clear bilaterally to auscultation.  There are no wheezes, rales, or  rhonchi.  CARDIOVASCULAR:  Revealed a regular rate and rhythm, with normal S1 and S2.  EXTREMITIES:  Demonstrated no cyanosis, clubbing, or edema.   MEDICATIONS:  Insulin, Vytorin, enalapril, Actos, metoprolol.  She is on  warfarin as directed.   IMPRESSION:  1. Atrial flutter, status post catheter ablation.  2. Isolated episode of atrial fibrillation of unclear clinical      significance.  3. Hypertension.  4. Diabetes.  5. Obesity.  6. Chronic Coumadin therapy.   DISCUSSION:  The patient has maintained sinus rhythm very nicely.  However,  because of her multiple risk factors for thromboembolism, I have  recommended  that she not discontinue her Coumadin for now.  Will plan to see her back in  the office on a p.r.n. basis.  She will follow up with Dr. Larose Kells.    ______________________________  Champ Mungo. Lovena Le, MD    GWT/MedQ  DD: 12/25/2005  DT: 12/28/2005  Job #: [id number]   cc:   [name], MD

## 2010-07-18 NOTE — Discharge Summary (Signed)
NAME:  [name] [name], [name] [name]             ACCOUNT NO.:  [id number]   MEDICAL RECORD NO.:  [id number]          PATIENT TYPE:  OIB   LOCATION:  6529                         FACILITY:  [facility]   PHYSICIAN:  Champ Mungo. Lovena Le, M.D.  DATE OF BIRTH:  1940-08-15   DATE OF ADMISSION:  05/11/2005  DATE OF DISCHARGE:  05/12/2005                                 DISCHARGE SUMMARY   ALLERGIES:  NOVOCAIN closes her throat.   PRINCIPAL DIAGNOSES:  1.  Discharging day #1, status post electrophysiology study, radiofrequency      catheter ablation of typical counterclockwise atrial flutter.  2.  Recurrent atrial flutter, admitted to [facility]. [facility]      May 07, 2005.  3.  DC cardioversion May 08, 2005.  4.  Initial diagnosis atrial flutter January 2007, status post DC      cardioversion March 18, 2005, on metoprolol 50 mg b.i.d.   SECONDARY DIAGNOSES:  1.  Left heart catheterization March 10, 2005, mild coronary artery      disease, ejection fraction 60%.  2.  Diabetes mellitus diagnosed 17 years ago.  3.  Hypertension.  4.  Family history of coronary artery disease.   PROCEDURE:  May 11, 2005, electrophysiology study, radiofrequency catheter  ablation of typical counterclockwise atrial flutter.  Patient had very large  flutter isthmus, 24 radiofrequency ablations were delivered.  No repeat of  atrial flutter or isthmus conduction post ablation, Dr. Cristopher Peru.   BRIEF HISTORY:  [name] [name] is a 70 year old female.  She awoke on the  morning of May 07, 2005, with a headache.  This, for her, is a feeling  described as a symptom which only occurs when she is experiencing  tachyarrhythmias.  Her husband took her blood pressure and heart rate.  Her  heart rate was in the 140s.   Patient has a history of atrial flutter diagnosed on emergency room visit  March 16, 2005.  At that visit, she had direct current cardioversion and  was started on metoprolol.  She has been  symptom-free and on Coumadin since  that time.   Presenting to the emergency department on May 07, 2005, she has had  adenosine challenge which demonstrates flutter waves during the block.  She  was started on IV Cardizem and her heart rate was easily controlled into the  60s with disappearance of her headache.  She maintains rate control, though  in atrial flutter after 16 hours.   Patient has had a rigorous cardiology work-up this year.  This included a  stress study which was abnormal.  She had paroxysmal atrial fibrillation  during the study.  She underwent catheterization March 10, 2005, which  showed mild coronary artery disease, ejection fraction 60%.   Patient was seen by electrophysiology on May 08, 2005, with regard to her  atrial flutter.  Her cardiac risk factors include hypertension,  dyslipidemia, family history of coronary artery disease.  Cardiac risks  include diabetes diagnosed 17 years ago.  Catheterization showed proximal  25% tandem stenoses in the LAD with a 30% mid point stenosis in the LAD.  Left circumflex, right coronary artery and PDA all without significant  stenoses.  Ejection fraction 60%.  No wall motion abnormalities.   HOSPITAL COURSE:  Patient presenting electively May 11, 2005, to [facility].  She has remained in sinus rhythm after DC  cardioversion on May 08, 2005.  She underwent electrophysiology study,  induction of atrial flutter with delivery of radiofrequency catheter  ablations to a very large flutter isthmus creating isthmus block.  Patient  was observed 20 minutes without recurrence of atrial flutter. Patient has  had no post procedural complications.  She has remained in sinus rhythm.   She will discharge on May 12, 2005, with follow-up with Dr. Lovena Le,  Monday, June 22, 2005, at 10:30 and she will see Dr. Harrington Challenger at 9:45 on June 22, 2005.   Patient may shower.  She is to call [phone] if she experiences  pain,  swelling or bleeding from her cath site.  She is asked to continue her  Coumadin 5 mg daily for the next three weeks only.  After stopping Coumadin,  to start enteric coated aspirin 81 mg daily for at least the next three  weeks.  She is advised that she plans dental work, even just teeth cleaning  through the next six months, she is to call [phone] for antibiotic  coverage.  Her metoprolol dose can be decreased from 50 mg twice daily to 25  mg twice daily.   Her other medications include  1.  Actos 50 mg daily.  2.  Vytorin 10/40 one tablet daily at bedtime.  3.  Enalapril 5 mg daily.  4.  Novolin 70/30 34 units in the morning and 22 units in the evening.   LABORATORY STUDIES:  Complete blood count May 08, 2005, white cells 7.3,  hemoglobin 13.8, hematocrit 41.3, platelets 276.  Serum electrolytes May 08, 2005, sodium 139, potassium 4, chloride 102, carbonate 28, BUN 19,  creatinine 1.2, glucose 167.  Her PT on May 11, 2005, was 28.9, INR 2.7.   30 minutes.      Sueanne Margarita, P.A.    ______________________________  Champ Mungo. Lovena Le, M.D.    GM/MEDQ  D:  05/12/2005  T:  05/13/2005  Job:  [id number]   cc:   Fay Records, M.D.  [address] [place]  [place] [address]

## 2010-07-18 NOTE — Op Note (Signed)
NAME:  CHINMAYI, MULLIS             ACCOUNT NO.:  [id number]   MEDICAL RECORD NO.:  [id number]          PATIENT TYPE:  OIB   LOCATION:  6529                         FACILITY:  [facility]   PHYSICIAN:  Champ Mungo. Lovena Le, M.D.  DATE OF BIRTH:  [birth year]   DATE OF PROCEDURE:  05/11/2005  DATE OF DISCHARGE:  05/12/2005                                 OPERATIVE REPORT   PROCEDURE PERFORMED:  Electrophysiologic study and catheter ablation of  typical AV flutter.   I:  INTRODUCTION:  The patient is a very pleasant 70 year old woman who was  admitted to the hospital one week ago with atrial flutter and a rapid  ventricular response.  Prior to this, she had been diagnosed with atrial  fibrillation back in January and, at that time, had undergone cardioversion.  The patient had been on Amiodarone.  She underwent cardioversion back to  sinus rhythm as she had been in flutter for just a brief period of time and  is now admitted for electrophysiology study and catheter ablation of her  atrial flutter.  Her INR was 2.7.   II:  PROCEDURE:  After informed consent was obtained, the patient was taken  to the diagnostic EP lab in a fasting state.  After the usual preparation  and draping, intravenous Fentanyl and Midazolam were given for sedation.  A  6 French hexapolar catheter was inserted percutaneously in the right jugular  vein and advanced to the coronary sinus.  A 5 French quadripolar catheter  was inserted percutaneously in the right femoral vein and advanced to the  His bundle region.  A 7 French quadripolar catheter was inserted  percutaneously in the right femoral vein and advanced to the right atrium.  After measurement of the basic intervals, a 7 French quadripolar ablation  catheter was advanced into the right ventricle and ventricular pacing was  carried out at 600 milliseconds which demonstrated VA dissociation.  Programmed ventricular stimulation was also carried out from the right  ventricle demonstrating VA dissociation.  Next, programmed atrial  stimulation was carried out from the coronary sinus as well as the high  right atrium at a base cycle length of 500 milliseconds.  The S1 S2 interval  was step wise decreased from 440 milliseconds down to 340 milliseconds where  the AV node ARP was observed.  During programmed atrial stimulation, there  were no AH jumps and no echo beats noted.  There was no inducible SVT.  Next, rapid atrial pacing was carried out from the coronary sinus and the  high right atrium at a base cycle length of 600 milliseconds, step wise  decreased down to 380 milliseconds where AV Wenckebach was observed.  During  rapid atrial pacing, the PR interval was less than the RR interval and there  was no inducible SVT.  Additional decrements down to 220 milliseconds  resulted in the initiation of atrial flutter.  Mapping of the atrial flutter  was carried out demonstrating typical counter clockwise tricuspid annular re-  entrant atrial flutter.  The 7 French quadripolar ablation catheter was then  maneuvered into the atrial flutter isthmus and  a total of 24 RF energy  applications were delivered.  Of note, the atrial flutter isthmus was much  larger than usual and there were two muscular ridges which made  stabilization of the catheter more difficult.  During the [id number] RF energy  application, there was clear cut atrial  flutter isthmus block.  Of  note,  under pacing, developed typical atrial flutter which stopped spontaneously.  At this point, the patient was observed for 20 minutes and had no recurrent  atrial flutter isthmus conduction.  The catheters were removed, hemostasis  was assured, and the patient was returned to her room in satisfactory  condition.   III:  COMPLICATIONS:  There were no immediate procedure complications.   IV:  RESULTS:  A.  Baseline ECG.  The baseline ECG demonstrates normal sinus rhythm with  normal axis and  intervals.  B.  Baseline intervals.  The sinus node cycle length was 1024 milliseconds,  the AH interval 114 milliseconds, the HV interval 56 milliseconds, QRS  duration 93 milliseconds.  C.  Rapid ventricular pacing.  Rapid ventricular pacing was carried out from  the RV apex demonstrating VA dissociations.  D.  Programmed ventricular stimulation.  Programmed ventricular stimulation  was carried out from the RV apex with a base cycle length of 600  milliseconds.  The S1 and S2 interval was stepwise decreased by VA  dissociation was present.  E.  Rapid atrial stimulation.  Programmed atrial stimulation was carried out  from the coronary sinus and high right atrium, base cycle length was 500  milliseconds.  The S1 and S2 interval was stepwise decreased from 440  milliseconds down to 340 milliseconds where AV nodal ARP was observed.  During programmed atrial stimulation, there were no AH jumps and no echo  beats noted.  During programmed atrial stimulation, there was no inducible  SVT.  F.  Rapid atrial pacing.  Rapid atrial pacing was carried out from the  coronary sinus as well as the high right atrium with base cycle length of  500 milliseconds.  The S1 and S2 interval was stepwise decreased down to 380  milliseconds where AV Wenckebach was observed.  Additional rapid atrial  pacing was carried out down to 220 milliseconds resulting in the initiation  of SVT (atrial flutter).  G.  Arrhythmias observed:  1.  Atrial flutter initiation rapid atrial pacing, duration sustained,      termination was spontaneous, cycle length 260 milliseconds.      1.  Mapping of atrial flutter isthmus demonstrated very large atrial          flutter isthmus, not one but two muscular ridges making placement of          the ablation catheter more difficult.   IV:  RF energy application.  A total of 24 RF energy applications were  delivered to the atrial flutter isthmus between the tricuspid valve annulus and  the station ridge.  Of note, there were several muscular ridges in this  area making ablation more difficult.  Eventually, atrial flutter isthmus  block was, however, demonstrated.   V:  CONCLUSION:  The study demonstrates successful electrophysiologic study  and RF catheter ablation of atrial flutter with a total of 24 RF energy  applications delivered to the atrial flutter isthmus resulting in  termination of atrial flutter, restoration of sinus rhythm, increase in  bidirectional block in the atrial flutter isthmus.           ______________________________  Champ Mungo.  Lovena Le, M.D.     GWT/MEDQ  D:  05/11/2005  T:  05/12/2005  Job:  [id number]   cc:   Ezzard Standing, M.D.  [phone]  Email: [email or web address]

## 2010-07-18 NOTE — Cardiovascular Report (Signed)
NAME:  [name] [name], [name] [name] NO.:  [id number]   MEDICAL RECORD NO.:  [id number]          PATIENT TYPE:  OIB   LOCATION:  1962                         FACILITY:  [facility]   PHYSICIAN:  Minus Breeding, M.D.   DATE OF BIRTH:  09-Jun-1940   DATE OF PROCEDURE:  03/10/2005  DATE OF DISCHARGE:  03/10/2005                              CARDIAC CATHETERIZATION   PRIMARY CARE PHYSICIAN:  Colon Branch, MD   CARDIOLOGIST:  Minus Breeding, M.D.   PROCEDURE NOTE:  Left heart catheterization/coronary arteriography.   INDICATIONS:  A patient with chest pain on the Cardiolite suggesting  possible antral ischemia with mild left ventricular dysfunction.   DESCRIPTION OF PROCEDURE:  Left heart catheterization was performed of the  right femoral artery. The artery was cannulated using anterior wall  puncture. A #4 French arterial sheath was inserted via the modified  Seldinger technique. Preformed Judkins and a pigtail catheter were utilized.  The patient tolerated the procedure well and left the lab in stable  condition.   RESULTS/HEMODYNAMICS:  LV 161/16, AO 160/107.   CORONARIES:  1.  The left main was short and normal.  2.  The LAD had proximal 25% stenosis x2.  There was mid 30% stenosis at a      mid diagonal. The first diagonal (mid diagonal) was moderate size and      normal.  3.  The circumflex essentially consisted of a large obtuse marginal which      was normal.  4.  The right coronary artery was a large dominant vessel.  It was normal      throughout its course.  There was a moderate size PDA and      posterolateral which were normal.   LEFT VENTRICULOGRAM:  The left ventriculogram was obtained in the RAO  projection. The EF was 60% with no regional wall motion abnormalities.   CONCLUSION:  1.  Mild coronary plaque.  2.  Normal left ventricular function (low normal).   PLAN:  No further cardiac workup is suggested. She will follow with Dr. Larose Kells.  Of note, she did have  atrial fibrillation at the time of her Cardiolite.  We  will have her follow with Dr. Dorris Carnes to see if any further investigation  of this is warranted.  I am not sure whether this was related to adenosine  injection or other.           ______________________________  Minus Breeding, M.D.     JH/MEDQ  D:  03/10/2005  T:  03/10/2005  Job:  [id number]   cc:   Colon Branch, MD [facility]  [phone] [place], [place] [address]   Fay Records, M.D.  [address] [place]  [place] [address]

## 2010-07-18 NOTE — Consult Note (Signed)
NAME:  ANOKHI, ALONGE             ACCOUNT NO.:  [id number]   MEDICAL RECORD NO.:  [id number]          PATIENT TYPE:  EMS   LOCATION:  MAJO                         FACILITY:  [facility]   PHYSICIAN:  Kirk Ruths, M.D. LHCDATE OF BIRTH:  Mar 28, 1940   DATE OF CONSULTATION:  02/24/2005  DATE OF DISCHARGE:  02/24/2005                                   CONSULTATION   CARDIOLOGY CONSULTATION   DATE OF CONSULTATION:  February 24, 2005.   PRIMARY CARE PHYSICIANS:  Primary care physician is Colon Branch, MD, primary  cardiologist is new,  will be Kirk Ruths, M.D.   CHIEF COMPLAINT:  Chest pain/nausea and vomiting.   HISTORY OF PRESENT ILLNESS:  Ms. Papageorgiou is a 69 year old female with  multiple cardiac risk factors but no history of coronary artery disease.  She had substernal chest pain that she describes as a tightness and a  pressure on Saturday at approximately 9:30 P.M.  It resolved without any  medication.  On Sunday, after breakfast she developed nausea and vomiting  but no diarrhea.  She says that she has been unable to keep food or liquids  down for more than a couple of hours since then.  She also has a headache.  She had some shortness of breath with the chest pain which lasted about 4 or  5 hours.  The chest pain was unchanged by position change or deep  inspiration.  She did not have any cough or fever.  That is the only episode  of chest pain she has ever had.  She does not exercise regularly but exerts  herself enough with activities of daily living, housework and yard work to  get short of breath occasionally but has no exertional symptoms.  Since  Sunday morning she has been unable to food or liquids down.  She has not had  abdominal pain but continues to complain of nausea.   PAST MEDICAL HISTORY:  Significant for diabetes for 17 years as well as  hypertension, hyperlipidemia, family history of coronary artery disease.  She also has diabetic retinopathy and  neuropathy.   PAST SURGICAL HISTORY:  She is status post eye surgery and has also had  injuries to her left wrist and shoulder.   ALLERGIES:  NOVOCAIN (which she describes as causing her throat to close).   CURRENT MEDICATIONS:  1.  70/30 insulin, generally 34 units in A.M. and 22 units in P.M. although      she took only 27 units today because of her lack of p.o. intake.  2.  Enalapril 10 mg a day.  3.  Aspirin 81 mg daily.  4.  Actos 30 mg a day.  5.  Vytorin 10/40 daily.  6.  Nabumetone 500 mg b.i.d. PRN but she has not taken this recently.   SOCIAL HISTORY:  She lives in [place] with her husband and is a retired  Marine scientist.  She quit tobacco 25 years ago and does not abuse  alcohol or drugs.   FAMILY HISTORY:  Mother died age 37 in her sleep.  Her father died age 81  and had heart disease. Her siblings have no history of heart disease.   REVIEW OF SYSTEMS:  She has had a runny nose but no other upper respiratory  symptoms.  Chest pain as described above.  She has some chronic arthralgias.  She has had abdominal pain but is not having any currently.  Review of  systems is otherwise negative.   PHYSICAL EXAMINATION:  VITAL SIGNS:  Temperature 97.4, blood pressure  180/80, pulse 73, respirations 20, oxygen saturation 97% on room air.  GENERAL APPEARANCE:  She is a well-developed, well-nourished white female  who appears nauseated at times and has dry heaves.  HEENT:  Her head is normocephalic, atraumatic with pupils equal, round,  reactive to light and accommodation. Extraocular movements intact. Sclerae  clear. Nares without discharge.  NECK:  There is no lymphadenopathy, thyromegaly, bruits or jugular venous  distention noted.  CARDIOVASCULAR: Her heart is regular in rate and rhythm with S1 and S2 and  no significant murmurs, rubs or gallops noted. Her distal pulses are 2+ in  all four extremities.  LUNGS:  Clear to auscultation bilaterally.  SKIN:  No  rashes or lesions are noted.  ABDOMEN:  Soft, nontender with active bowel sounds and no hepatosplenomegaly  by palpation.  EXTREMITIES:  She has 1 to 2+ edema bilaterally.  MUSCULOSKELETAL:  She has no joint deformity or effusions and no spine or  costovertebral angle tenderness.  NEUROLOGICAL:  She is alert and oriented with cranial nerves II-XII grossly  intact.   CLINICAL DATA:  Electrocardiogram with sinus rhythm, rate 73 beats per  minute with  no acute ischemic changes.   LABORATORY VALUES:  Hemoglobin 11.9, hematocrit 34.8, white blood cell count  8.8, platelet count 270,000.  Sodium 140, potassium 3.6, chloride 104, cO2  25, BUN 10, creatinine 1.0, glucose 71.  INR 1.1.  PTT 35.  Albumin 3.3 and  total bilirubin 1.3, other CMET values within normal limits.  Amylase 49,  lipase 15, CK-MB and troponin-I are negative.   IMPRESSION:  Ms. Kuester is a 70 year old female with past medical history of  diabetes, hyperlipidemia and hypertension.  She developed chest pain and  then 12 hours later nausea and vomiting.  She denies dyspnea on exertion,  orthopnea, paroxysmal nocturnal dyspnea, pedal edema or exertional chest  pain.  The pain was not pleuritic or positional and the duration was 4 or 5  hours.  Beginning Sunday A.M. she had persistent nausea and vomiting and  decreased p.o. intake but no chest pain.  She denies dysphagia, diarrhea,  hematochezia, melena or abnormal stools.  She was seen by Dr. Larose Kells and  referred to the emergency room.  On physical examination she has no right  upper quadrant pain or epigastric pain and her electrocardiogram was without  significant changes.  We will check enzymes, and if negative, she has ruled  out as pain is greater than 48 hours ago.  If her cardiac enzymes are  negative and outpatient adenosine Myoview is appropriate.  The patient's  symptoms are questionably secondary to viral gastroenteritis.  Dr. Larose Kells was contacted once her labs were  available and he reviewed them.  He  felt that she did not need an inpatient gastroenterology evaluation.  The  patient was feeling much better.  She received intravenous hydration while  she was in the emergency room and he symptoms were controlled with Zofran.  Per Dr. Larose Kells' request, she was given a prescription for Phenergan  suppositories as well as oral  Phenergan.  She was cleared for discharge by  him and is to see Dr. Larose Kells on February 25, 2005.  She will be contacted by  [facility] for an outpatient stress test some  time next week for ischemia evaluation.  If she has recurrent chest pain she  is to follow up with cardiology prior to that.  Otherwise, she is to follow  up with Dr. Larose Kells as scheduled and with Dr. Stanford Breed as needed.   Dr. Stanford Breed saw the patient and determined the plan of care.      Rosaria Ferries, P.A. [facility]    ______________________________  Kirk Ruths, M.D. [facility]    RB/MEDQ  D:  02/24/2005  T:  02/25/2005  Job:  [id number]   cc:   Colon Branch, MD [facility]  [phone] [place], [place] [address]

## 2010-07-18 NOTE — H&P (Signed)
NAME:  [name] [name], [name] [name]             ACCOUNT NO.:  [id number]   MEDICAL RECORD NO.:  [id number]          PATIENT TYPE:  INP   LOCATION:  1825                         FACILITY:  [facility]   PHYSICIAN:  Scarlett Presto, M.D.   DATE OF BIRTH:  07/03/1940   DATE OF ADMISSION:  05/07/2005  DATE OF DISCHARGE:                                HISTORY & PHYSICAL   CHIEF COMPLAINT:  Headaches with fast heart rate.   HISTORY OF PRESENT ILLNESS:  The patient is a 70 year old female with a  history of paroxysmal atrial flutter and fibrillation, was cardioverted on a  recent admission on March 18, 2005, and discharged to home in sinus  rhythm.  The patient was placed on a beta blocker and Coumadin in January.  The patient states she woke up this morning and felt her head pounding.  Her  husband took her blood pressure which showed hypotension, the patient states  in the 50s, with tachycardia and rates in the 130s.  The patient reported to  the emergency room where she was stable.  The patient is unaware of her  palpitations.  She states she has not been ill and no other associates  symptoms with her fast heart rate.   ALLERGIES:  NOVOCAIN.   CURRENT MEDICATIONS:  Actos 50 mg, Enalapril 5 mg, insulin 70/30, 22 in the  evening and 34 in the a.m., Coumadin 5 mg, Lopressor 50 mg, and Vytorin  10/40.   PAST MEDICAL HISTORY:  Insulin dependent diabetes mellitus for 17 years, she  has retinopathy and neuropathy from this.  She has a history of  hypercholesterolemia, hypertension, atrial fibrillation and atrial flutter  which is paroxysmal.  Her last cardiac catheterization was performed on  March 10, 2005, which showed a left main that was short but normal, LAD had  a proximal 25% stenosis x 2, mid stenosis of 30% at the mid diagonal, and a  circumflex with a large obtuse marginal that was normal and a right coronary  large dominant vessel.  Her ejection fraction was 60%.   SOCIAL HISTORY:  The patient  lives in [place] with her husband.  She is a  retired Scientist, physiological.  She has no tobacco abuse at this time,  however, she quit approximately 25 years ago.  She uses no alcohol or  illicit drug use.   FAMILY HISTORY:  Significant for a mother deceased at age 76, the patient  states she died in her sleep.  Father died at age 98 with an MI.  Sister has  diabetes mellitus.   REVIEW OF SYSTEMS:  The patient has no complaints of chest pain, shortness  of breath, dyspnea on exertion, orthopnea, PND, edema, palpitations, pre-  syncope.  She has no fevers, chills, sweats, or adenopathy.  HEENT:  Significant for headaches.  GU is negative for frequency, urination,  dysuria, hematuria.  Neuropsychiatric:  No weakness, numbness, mood  disturbances, depression or anxiety.  GI: No nausea, vomiting, or diarrhea.  No abdominal pain.  All other systems negative except as noted.   PHYSICAL EXAMINATION:  VITAL SIGNS:  Temperature 98.1, pulse  129 to 131, respiratory rate 18, blood  pressure on admission was 84/47, currently running 107/62, 95% saturation on  room air.  GENERAL:  In general, she is in no acute distress.  HEENT:  Pupils equal, round, reactive to light, EOMI, sclerae clear.  No  erythema or exudate noted.  NECK:  Supple without lymphadenopathy, TMJ, bruit, or JVD.  CARDIOVASCULAR:  S1 and S2, tachycardic, unable to hear any murmurs,  gallops, and rubs.  No bruits appreciated.  LUNGS:  Clear to auscultation  bilaterally.  SKIN:  No rashes or lesions.  ABDOMEN:  Soft, nontender, no rebound or guarding, positive bowel sounds all  four quadrants, no organomegaly appreciated.  EXTREMITIES:  No cyanosis, clubbing, edema, rashes, or lesions.  Pulses 2+  DP, 1+ PT.  MUSCULOSKELETAL:  No joint deformities, effusions, no spine CVA tenderness.  NEUROLOGICAL:  Alert and oriented x 3.  Cranial nerves 2-12 grossly intact.   Chest x-ray at this time is pending.  EKG shows sinus tachy  flutter rhythm  with a rate of 129.  Labs show hemoglobin 15, hematocrit 44, sodium 134,  potassium 4.5, chloride 105, CO2 22, creatinine 1.1, glucose 114, INR 2.2.   The patient was seen and examined with Dr. Scarlett Presto, tachycardia was  unclear, so we administered 6 mg of Adenosine which showed that the patient  appears to have been in A-flutter with a 2 to 1 block.  The patient is  currently on Coumadin and Lopressor.  We started her on Diltiazem 5 mg IV  push and 15 mg Cardizem drip.  Also contacted EP for evaluation of  questionable A-flutter ablation.  We will continue her Coumadin at this  time.     ______________________________  April Humphrey, NP      Scarlett Presto, M.D.  Electronically Signed    AH/MEDQ  D:  05/07/2005  T:  05/07/2005  Job:  [id number]

## 2010-07-18 NOTE — Consult Note (Signed)
NAME:  [name] [name], [name] [name]             ACCOUNT NO.:  [id number]   MEDICAL RECORD NO.:  [id number]          PATIENT TYPE:  INP   LOCATION:  4706                         FACILITY:  [facility]   PHYSICIAN:  Sueanne Margarita, P.A. DATE OF BIRTH:  [birth year]   DATE OF CONSULTATION:  05/08/2005  DATE OF DISCHARGE:  05/08/2005                                   CONSULTATION   ALLERGIES:  NOVOCAIN which closes her throat.   PRESENTING CIRCUMSTANCE:  I am back in that rhythm, again.   Ms. Hickey is a 70 year old female who awoke yesterday (March 8) in the  morning with headache.  This, for her, is a feeling described as a  headache which happens only when she is experiencing tachy arrhythmias.  Her  husband took her blood pressure and heart rate, her heart rate was in the  140s.  The patient has a history of atrial flutter diagnosed on an emergency  room visit March 16, 2005.  At that time, she had direct current  cardioversion under Metoprolol.  She has been symptom free and on Coumadin  since that time.  In the emergency department today, she had an Adenosine  challenge which demonstrates flutter waves during the block.  She was  started on IV Cardizem and her heart rate in the 140s easily were controlled  into the 60s with disappearance of her headache which continues until this  morning's examination.   Of note, the patient also had a rigorous cardiology workup this year which  included a stress study January 4 which was abnormal and demonstrated a  paroxysmal atrial fibrillation, catheterization March 10, 2005, which  showed mild coronary artery disease with ejection fraction 60%.  Electrophysiology is asked to see her with regards to her atrial flutter.  Cardiac risk factors include hypertension, dyslipidemia, family history of  coronary artery disease.  Cardiac risk equivalent include diabetes which was  diagnosed 17 years ago.  Left heart catheterization March 10, 2005, the LAD  had a  proximal 25% tandem stenosis, 30%  mid point stenosis, left  circumflex, right coronary artery, and PDA all without significant stenoses.  Ejection fraction 60% with no wall motion abnormalities.   MEDICATIONS:  1.  Actos 50 mg daily.  2.  Enalapril 5 mg daily.  3.  Coumadin 5 mg daily.  4.  Metoprolol 50 mg b.i.d.  5.  Vytorin 10/40 one tab daily.  6.  Novolin 70/30, 34 units in the morning, 22 units in the evening.  7.  The patient is on an IV Cardizem drip at the time of this examination.   SOCIAL HISTORY:  The patient lives in [place] with her husband.  She does  not smoke or partake of alcoholic beverages.  She is a retired Manufacturing systems engineer.   FAMILY HISTORY:  Mother died at age 48 in her sleep.  Father died at age 39,  he had coronary artery disease.  Siblings without history of coronary artery  disease.  One sister has diabetes.   REVIEW OF SYSTEMS:  The patient is not experiencing fevers, chills, or night  sweats.  She has had no weight change or loss unintentionally in the last  six months.  HEENT:  No epistaxis, no hoarseness, no vertigo, no  photophobia.  INTEGUMENT:  No rashes, no non-healing ulcerations to the  lower extremities.  CARDIOPULMONARY : The patient was without chest pain,  shortness of breath, dyspnea on exertion, orthopnea, or paroxysmal nocturnal  dyspnea.  She does not exhibit edema.  She has had no history of presyncope  or syncope.  No history of palpitations with her atrial flutter.  Her only  symptom is a tingling/crawling feeling on the scalp which she describes as a  headache.  UROGENITAL:  Nocturia occasionally.  NEUROLOGIC/PSYCHIATRIC:  No  weakness, numbness, or depression.  GI:  No history of gastroesophageal  reflux disease, no history of GI bleed, no recent nausea and vomiting.  ENDOCRINE:  No history of thyroid disease, diabetes well controlled with  insulin diagnosed 17 years ago.   PHYSICAL EXAMINATION:  GENERAL:  Alert and  oriented x 3, the patient is in no acute distress.  The  patient's headache which she had on presentation is now gone.  VITAL SIGNS:  Temperature 97.6, pulse 68 and regular with flutter waves on  EKG, blood pressure 132/65, respirations 18, oxygen saturation 94% on room  air.  HEENT:  Normocephalic, atraumatic, pupils equal, round, reactive to light,  extraocular movements intact, sclerae anicteric, nares without discharge.  NECK:  Supple, no carotid bruits auscultated, no thyromegaly, no jugular  venous distention.  LUNGS:  Clear to auscultation and percussion bilaterally.  HEART:  Regular rate and rhythm without murmur.  ABDOMEN:  Soft, mildly obese, no rebound or guarding, no hepatosplenomegaly.  Abdominal aorta is non-pulsatile and non-expansile.  EXTREMITIES:  No evidence of cyanosis, clubbing, and edema.  There are no  non-healing ulcerations.  MUSCULOSKELETAL:  Without kyphosis or scoliosis.  NEUROLOGICAL:  Cranial nerves 2-12 are grossly intact, no focal deficits.   LABORATORY DATA:  Chest x-ray shows no acute disease.  Electrocardiogram  May 07, 2005, at 1358 hours, atrial flutter, typical rate 140.  There is a  strip March 8 at 1605 hours, Adenosine challenge, with flutter wave  emergence at block.  Laboratory studies this morning, PT 24.4, INR 2.2,  magnesium 1.8.  CBC with white blood cell count 7.3, hemoglobin 13.8,  hematocrit 41.3, platelets 276.  Serum electrolytes with sodium 139,  potassium 4, chloride 102, bicarb 28, BUN 19, creatinine 1.2, glucose 167.  Alkaline phos 62, SGOT 24, SGPT 18.  TSH 1.076.  Cholesterol 156,  triglycerides 73, HDL 43.2, LDL 98.   IMPRESSION:  1.  Admitted with recurrence of atrial flutter (symptoms are tingling and a      crawling feeling on the scalp described loosely as headache).  2.  Admitted February 24, 2005, with nausea and vomiting. 3.  Office visit with stress study March 05, 2005, she was in paroxysmal      atrial  fibrillation at this time, Cardiolite was abnormal, recommending      catheterization.  4.  Catheterization March 10, 2005, mild coronary artery disease with      ejection fraction 60%.  5.  Admission March 16, 2005, the patient was in atrial flutter, she had      DC cardioversion March 18, 2005, and was started on Metoprolol 50 mg      b.i.d.  6.  Diabetes mellitus diagnosed 17 years ago.  7.  Hypertension.  8.  Family history of coronary artery disease.   PLAN:  DC cardioversion this morning, March 9, then to discharge on  Metoprolol and Cardizem, to return March 12 at 11:30 for atrial flutter  ablation by Dr. Cristopher Peru.      Sueanne Margarita, P.A.     GM/MEDQ  D:  05/08/2005  T:  05/09/2005  Job:  [id number]

## 2010-07-18 NOTE — Op Note (Signed)
NAME:  LOKI, ESCARCEGA             ACCOUNT NO.:  [id number]   MEDICAL RECORD NO.:  [id number]          PATIENT TYPE:  INP   LOCATION:  4706                         FACILITY:  [facility]   PHYSICIAN:  Dola Argyle, M.D.     DATE OF BIRTH:  1940-09-06   DATE OF PROCEDURE:  05/08/2005  DATE OF DISCHARGE:  05/08/2005                                 OPERATIVE REPORT   PROCEDURE:  Cardioversion.   HISTORY:  The patient has atrial flutter. Ultimately she will undergo RF  ablation. It was appropriate to proceed with cardioversion today.   Anesthesia was present. The patient received 80 mg of IV propofol and 50 mg  of IV lidocaine. Anterior-posterior pads were used with a biphasic  defibrillator. The patient received 100 joules; and she converted  immediately to sinus rhythm, sinus bradycardia. She is stable and waking up.   Successful cardioversion.           ______________________________  Dola Argyle, M.D.     JK/MEDQ  D:  05/08/2005  T:  05/09/2005  Job:  [id number]   cc:   Fay Records, M.D.  [address] [place]  [place] [address]

## 2010-07-18 NOTE — Discharge Summary (Signed)
NAME:  DARRELLE, HENDRICH             ACCOUNT NO.:  [id number]   MEDICAL RECORD NO.:  [id number]          PATIENT TYPE:  INP   LOCATION:  [id number]                         FACILITY:  [facility]   PHYSICIAN:  Minus Breeding, M.D.   DATE OF BIRTH:  December 18, 1940   DATE OF ADMISSION:  03/16/2005  DATE OF DISCHARGE:  03/18/2005                           DISCHARGE SUMMARY - REFERRING   DISCHARGE DIAGNOSIS:  Symptomatic atrial flutter with a rapid ventricular  rate. History as noted below.   PROCEDURE PERFORMED:  Status post DC cardioversion on March 18, 2005.   BRIEF HISTORY:  Ms. Gearty is a 70 year old white female who presented to  her primary care physician with palpitations and malaise and a heart rate of  150 associated with hypotension and diaphoresis. She is referred to [facility] for further treatment and admitted.   PAST MEDICAL HISTORY:  Notable for atrial fibrillation when she presented  for a stress test in the past. At that time she was not started on a beta  blocker or anticoagulation despite continuing to have problems with  palpitations. Echocardiogram in the past has shown an ejection fraction of  47%. She has a history of mild hypertension, diabetes for the last 15 years,  retinopathy, neuropathy, and hyperlipidemia. She is status post cataract  surgery, catheterization in the past that has shown nonobstructive coronary  artery disease.   LABORATORY DATA:  Chest x-ray on admission showed mild hyperaeration on the  right base. Otherwise normal. No prior films for comparison. Admission  weight was 217. Admission H&H was 13.8/40.6, normal indices, platelet count  394,000, WBC 9.2.  Admission PTT was 28 with a PT of 13.9, INR 1.1. INR on  the 16th was 1.1, INR on the 17th was 1.6.  Admission sodium was 141,  potassium 4.4, BUN 19, creatinine 1.4, glucose  90, normal LFTs. CK-MBs were  negative for myocardial infarctions. Troponins were slightly elevated at  0.05, 0.07, and  0.07. TSH was normal at 1.675. Initial   EKG showed atrial flutter with a 2:1 block and a ventricular rate of 142.  Echocardiogram on March 16, 2005, was suboptimal for imaging. Difficult to  assess EF. Global LV hypokinesis. Bilateral atrial enlargement.   HOSPITAL COURSE:  After being evaluated by Dr. Rosanne Sack and Dr.  Lattie Haw she was admitted to [facility] and placed on Lovenox.  Pharmacy assisted with Coumadin dosing.  Case management also assisted with  discharge needs. She remained in atrial flutter despite medications. She was  given a bolus of Cardizem and her Lopressor increased. On March 18, 2005,  Dr. Percival Spanish performed DC cardioversion for symptomatic atrial flutter. She  was shocked with 75 joules biphasic energy restoring normal sinus rhythm.  Per pharmacy teaching of self-administration of Lovenox, the patient was  discharged home.   DISPOSITION:  She is asked to maintain a low salt, fat, cholesterol, ADA  diet. She is asked to bring all medications to all appointments. Her new  medications include Coumadin 5 mg daily, Lovenox 1 mg/kg q.12h. until INR is  therapeutic between 2-3. Lopressor 50 mg b.i.d. She is  asked to continue her  insulin 70/30--34 units q.a.m. and 22 units q.p.m., enalapril 10 mg daily,  aspirin 81 mg daily, Actos 30 mg daily, Vytorin 10/40 daily. She will follow  up  with Dr. Harrington Challenger on March 26, 2005, at 1:45 p.m. She will have a PT-INR  checked in the Coumadin Clinic at Dr. Harrington Challenger' on Friday, March 20, 2005, at  11:45 a.m.   Discharge time less than 30 minutes.      Sharyl Nimrod, P.A. [facility]    ______________________________  Minus Breeding, M.D.    EW/MEDQ  D:  03/18/2005  T:  03/18/2005  Job:  [id number]   cc:   Colon Branch, MD [place]  [phone] [place], [place] [address]   Fay Records, M.D.  [address] [place]  [place] [address]

## 2010-07-18 NOTE — Assessment & Plan Note (Signed)
[name]                             [name]   NAME:[name], [name] OGNIBENE                    MRN:          [id number]  DATE:06/29/2006                            DOB:          Feb 11, 1941    This is a 70 year old white female, remote smoker who says she has a  bad cold at least once per year for years.  Typically she receives a  course of antibiotics after the cold goes into her chest, and then it  clears.  She had a similar episode recently which led to a chest x-ray  by Dr. Larose Kells showing abnormal paratracheal density, in turn leading to a  CT scan suggesting a lung mass and, therefore, seen at Dr. Larose Kells' request.  She does not recall having a previous x-ray in over 20 years.   She denies any ongoing cold symptoms including cough, dyspnea,  subjective wheeze, fevers, chills, sweats, orthopnea, PND, or leg  swelling.  However, her husband is aware of her wheezing every night  for years.   PAST MEDICAL HISTORY:  Significant for:  1. Hypertension versus on chronic ACE inhibitors  2. Diabetes.  3. Hyperlipidemia.  4. History of atrial flutter for which she underwent RF ablation and      has been maintained on Coumadin since last seen by Dr. Lovena Le      December 25, 2005.  (See Dr. Tanna Furry last [name] of December 25, 2005.)   ALLERGIES:  NOVOCAIN.   MEDICATIONS:  Include Enalapril but no [name] medicines.   SOCIAL HISTORY:  She quit smoking 20 years ago.  She works as a  Network engineer.  No unusual travel, pets, or hobby exposure.   FAMILY HISTORY:  Negative for respiratory diseases, positive for cancer  of some type in paternal grandfather.   REVIEW OF SYSTEMS:  Taken in detail on the worksheet and negative except  as outlined above.   PHYSICAL EXAMINATION:  GENERAL: This is a quite pleasant, moderately  obese white female in no acute distress who is up and around.  HEENT:  Unremarkable. Oropharynx clear, dentition intact.  Nasal  turbinates clear.  LUNGS:  She has classic pseudowheeze on exam with perfectly clear lung  fields, nothing localized, generalized, or otherwise wheezing of the  lower airways.  HEART:  Regular rate and rhythm without murmur, rub, or gallop.  ABDOMEN: Soft, benign, without palpable organomegaly mass or tenderness.  EXTREMITIES:  Warm without calf tenderness, cyanosis, clubbing.   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   IMPRESSION:  1. 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  The fact is      that the noise she makes bothers her husband more they bother her,  but when she gets a cold, the problem decompensates and almost      always requires an office visit.  This pattern has been present for      years, but she has been on Enalapril for years.  I would recommend,      especially if we are considering surgery on problem #2, that she      switch over to ARB therapy at least in the short run to see if her      wheeze resolves.  I, therefore, gave her Avapro 150 mg 1 q.a.m.      samples for 6 weeks.  2. 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  There are      no prior x-rays available.  My understanding is that the earlier      one resects cysts like this, the easier they are to manage      surgically, and certainly that would be the only way to be 100%      sure that it is benign.  It is not easily amenable to fiber      bronchoscopy and, therefore, I am going to refer her directly to      Dr. Arlyce Dice for evaluation and management including surgery if       necessary.  3. History of atrial flutter.  According to Dr. Tanna Furry last [name],      the Coumadin is optional at this point and certainly in the short      run could be stopped, but I would, of course, defer this to Dr.      Lovena Le and have Dr. Lovena Le clear her for surgery since she will      need to be off of anticoagulants for a prolonged period postop as      well.     [name] [name]. [name] Novas, MD, [facility]  Electronically Signed    MBW/MedQ  DD: 06/30/2006  DT: 06/30/2006  Job #: [id number]   cc:   [name] November, MD  [name] [name], M.D.

## 2010-07-18 NOTE — Discharge Summary (Signed)
NAME:  [name] [name], [name] [name]             ACCOUNT NO.:  [id number]   MEDICAL RECORD NO.:  [id number]          PATIENT TYPE:  INP   LOCATION:  4706                         FACILITY:  [facility]   PHYSICIAN:  Champ Mungo. Lovena Le, M.D.  DATE OF BIRTH:  1940-05-18   DATE OF ADMISSION:  05/07/2005  DATE OF DISCHARGE:  05/08/2005                                 DISCHARGE SUMMARY   ALLERGIES:  NOVOCAIN, (causes throat to close).   PRINCIPAL DIAGNOSES:  1.  Discharging the day of direct current Cardioversion of recurrent atrial      flutter.  2.  History of atrial flutter with previous Cardioversion March 18, 2005.      The patient has been on metoprolol and Coumadin.  3.  Left heart catheterization March 10, 2005. Mild coronary artery disease      with ejection fraction of 60%.  4.  Outpatient stress study March 05, 2005. The patient was having      paroxysmal atrial fibrillation. The study was abnormal, suggesting      catheterization.   SECONDARY DIAGNOSES:  1.  Diabetes.  2.  Hypertension.  3.  Family history of coronary artery disease.  4.  Dyslipidemia.   PROCEDURE:  May 08, 2005 - Direct current Cardioversion, 100 joules  applied, converting typical atrial flutter to sinus rhythm, Dr. Dola Argyle, Practitioner.   HISTORY OF PRESENT ILLNESS:  [name] [name] is a 70 year old female.  She has a history of atrial flutter, which was diagnosed on an emergency  room visit March 16, 2005. At that time, March 18, 2005, she had direct  current cardioversion under metoprolol. She has been symptom free and on  Coumadin since that time.   She awoke on the morning of May 07, 2005 with headache. This for her, is  a feeling - described as headache, which happens only when she is  experiencing tachy-arrhythmias. These have only started in the last 2 to 3  months. Her husband took her heart rate and blood pressure. Her heart rate  was found to be in the 140's and she came to the emergency  room.   In the emergency room, electrocardiogram showed recurrence of atrial  flutter, typical pattern. She had Adenosine challenge, which demonstrated  flutter waves at the time of the block. She was started on IV Cardizem with  heart rate in the 140's and had easy rate control into the 60's overnight  with disappearance of her headache symptoms. She was seen in consultation by  electrophysiology, Dr. Cristopher Peru, who recommended DC Cardioversion with  atrial flutter ablation to follow, probably as an outpatient.   HOSPITAL COURSE:  The patient admitted through the emergency room on May 07, 2005 in recurrent atrial flutter, which is mildly symptomatic. The  patient does not have chest pain, shortness of breath, feeling of  palpitation, or dizziness. Only a tingling or crawling feeling on her scalp.  The atrial flutter was easily rate controlled with IV Cardizem in addition  to her metoprolol 50 mg b.i.d. She remained in atrial flutter but in  controlled ventricular rates overnight. The  patient then underwent DC  Cardioversion the morning of May 08, 2005 with conversion to sinus rhythm  and she is maintaining sinus rhythm. She is on both Cardizem and metoprolol  at the time of discharge.   DISCHARGE MEDICATIONS:  1.  Cardizem CD 120 mg daily.  2.  Metoprolol 50 mg twice daily.  3.  Actos 15 mg daily.  4.  Enalapril 5 mg daily.  5.  Vytorin 10/40 1 tab daily at bedtime.  6.  Novolin 70/30-30 2 units in the morning, 22 units in the evening.  7.  Coumadin 5 mg daily.   ACTIVITY:  She is asked not to drive for the next 2 days.   DIET:  Low-sodium, low-cholesterol, diabetic diet.   FOLLOW UP:  She will present short stay C at [facility] 11:30 on  Monday, May 11, 2005. She is asked to eat nothing after midnight on  Sunday, May 10, 2005. She is also asked not to take either metoprolol or  Cardizem on the morning of May 11, 2005.      Sueanne Margarita, P.A.     ______________________________  Champ Mungo. Lovena Le, M.D.    GM/MEDQ  D:  05/08/2005  T:  05/09/2005  Job:  [id number]   cc:   Champ Mungo. Lovena Le, M.D.  [address]  [address]  [place]  [place] [address]   Colon Branch, MD [facility]  [phone] [address] [place], [place] [address]

## 2010-07-18 NOTE — Op Note (Signed)
NAME:  [name] [name], [name] [name] NO.:  [id number]   MEDICAL RECORD NO.:  [id number]          PATIENT TYPE:  INP   LOCATION:  [id number]                         FACILITY:  [facility]   PHYSICIAN:  Minus Breeding, M.D.   DATE OF BIRTH:  09-Nov-1940   DATE OF PROCEDURE:  03/18/2005  DATE OF DISCHARGE:                                 OPERATIVE REPORT   The primary is Colon Branch, M.D., the cardiologist Fay Records, M.D.   PROCEDURE:  Direct current cardioversion.   INDICATIONS:  Symptomatic atrial flutter with rapid ventricular rate.   PROCEDURE NOTE:  The patient developed the atrial flutter the morning prior  to admission.  She was started on full-dose Lovenox and Coumadin upon  admission (this was much less than 48 hours after the onset of her flutter).  She signed the appropriate informed consent.  Anesthesia was administered  per Dr. Linna Caprice using 50 mg of Diprivan and 10 mg of 2% lidocaine.  Cardioversion was carried out with 75 joules of biphasic energy,  synchronized, x1.  This resulted in normal sinus rhythm.   EKG is pending.   COMPLICATIONS:  None apparent.           ______________________________  Minus Breeding, M.D.     JH/MEDQ  D:  03/18/2005  T:  03/18/2005  Job:  [id number]   cc:   Colon Branch, MD [facility]  [phone] [address] [place], [place] [address]

## 2010-07-18 NOTE — H&P (Signed)
NAME:  [name] [name], [name] [name]             ACCOUNT NO.:  [id number]   MEDICAL RECORD NO.:  [id number]          PATIENT TYPE:  INP   LOCATION:  1831                         FACILITY:  [facility]   PHYSICIAN:  Jacqulyn Ducking, M.D. LHCDATE OF BIRTH:  1941/02/05   DATE OF ADMISSION:  03/16/2005  DATE OF DISCHARGE:                                HISTORY & PHYSICAL   REFERRING AND PRIMARY CARE PHYSICIAN:  Dr. Larose Kells.   PRIMARY CARDIOLOGIST:  Dr. Harrington Challenger   HISTORY OF PRESENT ILLNESS:  70 year old woman with a history of paroxysmal  atrial arrhythmias.  Presented to her primary care physician with  palpitations, malaise, relative hypotension, and a heart rate of 150.   Ms. Kalp was previously evaluated for chest discomfort which was stemmed  from her current symptoms.  When she presented for a stress test she was  found to be in atrial fibrillation.  She was subsequently admitted and  underwent cardiac catheterization which revealed insignificant coronary  disease.  Atrial fibrillation proved to be paroxysmal.  She was not started  on beta blocker or on anticoagulation.   At the present time she has continuing palpitations.  She has felt mildly  diaphoretic with generalized coolness of her skin.  She has had  lightheadedness and near syncope with this episode, but not with her prior  atrial arrhythmia.   She reportedly has had an echocardiogram in the past.  She had an ejection  fraction of 47% on her nuclear study but this may been an underestimate due  to the presence of atrial arrhythmias.  Ejection fraction was normal at  catheterization.   The patient does have a history of mild hypertension that has been well  controlled with medical therapy.  She has had diabetes for approximately 15  years.   Past medical history is otherwise notable for retinopathy, neuropathy, and  hyperlipidemia.  She has had cataract surgery in the past and has  experienced fracture of her hand and shoulder.   She  reports an allergy to [place].   CURRENT MEDICATIONS:  1.  Insulin 70/30 34 units q.a.m. and 22 units q.p.m.  2.  Enalapril 10 mg daily.  3.  Aspirin 81 mg daily.  4.  Actos 30 mg daily.  5.  Vytorin 10/40 mg daily.   SOCIAL HISTORY:  Married and lives in [place]; no history of tobacco or  alcohol use.   FAMILY HISTORY:  Mother died suddenly at age 69; father died at age 69 due  to myocardial infarction; has a sister with diabetes.   REVIEW OF SYSTEMS:  Notable for intermittent headaches, episodic nausea, and  emesis.  All other systems reviewed and are negative.   PHYSICAL EXAMINATION:  GENERAL:  Pleasant, overweight woman.  VITAL SIGNS:  The temperature is not yet recorded, heart rate 150 and  regular, respirations 20, blood pressure 85/60, O2 saturation 97% on room  air.  Afebrile.  HEENT:  Anicteric sclerae; pupils equal, round, react to light.  NECK:  No jugular venous distension; normal carotid upstrokes without  bruits.  LUNGS:  Minimal left basilar rales.  CARDIAC:  Normal  first and second heart sounds; minimal systolic murmur;  normal PMI.  ABDOMEN:  Soft and nontender; no organomegaly; normal bowel sounds.  EXTREMITIES:  1+ dorsalis pedis pulses; 2+ posterior tibial pulses; no  edema.  NEUROMUSCULAR:  Symmetric strength and tone; normal cranial nerves.  SKIN:  No significant lesions.  ENDOCRINE:  No thyromegaly.  HEMATOPOIETIC:  No adenopathy.  PSYCHIATRIC:  Alert and oriented; normal affect.   LABORATORIES:  Pending.   Carotid sinus massage resulted in no change in her rhythm.  6 mg of  adenosine caused transient AV block with emergence of atrial flutter.  She  was subsequently given 15 mg of intravenous diltiazem with a decrease in her  heart rate to 70.   IMPRESSION:  Acute onset of atrial flutter.  Spontaneous conversion is  likely.  She will be started on a beta blocker and warfarin as well as low-  dose enoxaparin.  A TSH level and echocardiogram will  be obtained.  If she  does not convert spontaneously she can be cardioverted tomorrow due to the  fact that the she has been in her arrhythmia for 24 hours or less.      Jacqulyn Ducking, M.D. [facility]  Electronically Signed     RR/MEDQ  D:  03/16/2005  T:  03/16/2005  Job:  [id number]

## 2010-08-07 ENCOUNTER — Ambulatory Visit (INDEPENDENT_AMBULATORY_CARE_PROVIDER_SITE_OTHER): Payer: Medicare Other | Admitting: *Deleted

## 2010-08-07 DIAGNOSIS — I4892 Unspecified atrial flutter: Secondary | ICD-10-CM

## 2010-08-07 DIAGNOSIS — Z7901 Long term (current) use of anticoagulants: Secondary | ICD-10-CM

## 2010-08-07 DIAGNOSIS — I4891 Unspecified atrial fibrillation: Secondary | ICD-10-CM

## 2010-08-07 LAB — POCT INR: INR: 1.6

## 2010-08-22 ENCOUNTER — Encounter: Payer: Medicare Other | Admitting: *Deleted

## 2010-08-26 ENCOUNTER — Ambulatory Visit (INDEPENDENT_AMBULATORY_CARE_PROVIDER_SITE_OTHER): Payer: Medicare Other | Admitting: *Deleted

## 2010-08-26 DIAGNOSIS — I4892 Unspecified atrial flutter: Secondary | ICD-10-CM

## 2010-08-26 DIAGNOSIS — Z7901 Long term (current) use of anticoagulants: Secondary | ICD-10-CM

## 2010-08-26 DIAGNOSIS — I4891 Unspecified atrial fibrillation: Secondary | ICD-10-CM

## 2010-08-30 ENCOUNTER — Other Ambulatory Visit: Payer: Self-pay | Admitting: Internal Medicine

## 2010-09-04 ENCOUNTER — Encounter: Payer: Self-pay | Admitting: Internal Medicine

## 2010-09-04 ENCOUNTER — Ambulatory Visit (INDEPENDENT_AMBULATORY_CARE_PROVIDER_SITE_OTHER): Payer: Medicare Other | Admitting: Internal Medicine

## 2010-09-04 DIAGNOSIS — E785 Hyperlipidemia, unspecified: Secondary | ICD-10-CM

## 2010-09-04 DIAGNOSIS — G609 Hereditary and idiopathic neuropathy, unspecified: Secondary | ICD-10-CM

## 2010-09-04 DIAGNOSIS — I10 Essential (primary) hypertension: Secondary | ICD-10-CM

## 2010-09-04 DIAGNOSIS — I4891 Unspecified atrial fibrillation: Secondary | ICD-10-CM

## 2010-09-04 LAB — BASIC METABOLIC PANEL
BUN: 18 mg/dL (ref 6–23)
CO2: 30 mEq/L (ref 19–32)
Chloride: 98 mEq/L (ref 96–112)
Creatinine, Ser: 1.1 mg/dL (ref 0.4–1.2)
Glucose, Bld: 137 mg/dL — ABNORMAL HIGH (ref 70–99)

## 2010-09-04 MED ORDER — IRBESARTAN 150 MG PO TABS: 150.0000 mg | ORAL_TABLET | Freq: Every day | ORAL | Status: DC

## 2010-09-04 MED ORDER — DILTIAZEM HCL 120 MG PO TABS: 120.0000 mg | ORAL_TABLET | Freq: Every day | ORAL | Status: DC

## 2010-09-04 MED ORDER — PRAVASTATIN SODIUM 40 MG PO TABS: 40.0000 mg | ORAL_TABLET | Freq: Every day | ORAL | Status: DC

## 2010-09-04 NOTE — Assessment & Plan Note (Signed)
Chart reviewed, SVT dx 2007, cath 2007 mild CAD, then had an cardioversion, ablation; still on coumadin , has occ palpitation, EKG 03-2010 NSR No change

## 2010-09-04 NOTE — Assessment & Plan Note (Signed)
Feet care reviewed

## 2010-09-04 NOTE — Progress Notes (Signed)
  Subjective:    Patient ID: [name] [name], female    DOB: 1940/12/01, 70 y.o.   MRN: [id number]  HPI DM-- per Dr [name] Cholesterol-- on pravachol , exercising more! HTN-- well controlled at home   Past Medical History  Diagnosis Date  . Blindness of left eye   . Eye muscle weakness     Right eye weakness after cataract surgery  . Common migraine     ?hx of  . DIABETES MELLITUS, TYPE II 03/27/2006  . HYPERLIPIDEMIA 03/27/2006  . 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 HYPERTENSION 03/27/2006  . Atrial fibrillation     SVT dx 2007, cath 2007 mild CAD, then had an cardioversion, ablation; still on coumadin , has occ palpitation, EKG 03-2010 NSR  . Headache 05/07/2009  . LUNG NODULE 09/01/2006    Excision, Bx Benighn  . Osteopenia 2004    Dexa 2004 showed Osteopenia, DEXA 03/2007 normal      Review of Systems  Respiratory: Negative for cough and shortness of breath.   Cardiovascular: Negative for chest pain and leg swelling. Palpitations: occ palpitations        No syncope        Objective:   Physical Exam  Constitutional: She is oriented to person, place, and time. She appears well-developed and well-nourished. No distress.  Cardiovascular: Normal rate, regular rhythm and normal heart sounds.   No murmur heard. Pulmonary/Chest: Effort normal and breath sounds normal. No respiratory distress. She has no wheezes. She has no rales.  Musculoskeletal: She exhibits no edema.  Neurological: She is alert and oriented to person, place, and time.          Assessment & Plan:

## 2010-09-04 NOTE — Assessment & Plan Note (Signed)
At goal, labs

## 2010-09-04 NOTE — Assessment & Plan Note (Signed)
Was on lipitor, changed to simva due to cost, then switched to pravachol d/t simva interaction w/ CCBs Last FLP showed need of better control, labs today

## 2010-09-09 ENCOUNTER — Telehealth: Payer: Self-pay | Admitting: *Deleted

## 2010-09-09 NOTE — Telephone Encounter (Signed)
Pt aware of labs and says she is due to see Dr. [name] this month so will have him recheck labs if not will let us know.

## 2010-09-09 NOTE — Telephone Encounter (Signed)
Message copied by Charlies Silvers on Tue Sep 09, 2010  9:46 AM ------      Message from: [name] E      Created: Mon Sep 08, 2010  8:19 PM       BMP ok, needed a CBC and FLP, please get results or redraw if needed

## 2010-09-09 NOTE — Telephone Encounter (Signed)
Message left for patient to return my call.  (FLP was not ordered, CBC not done- pt needs to come back)

## 2010-09-14 ENCOUNTER — Telehealth: Payer: Self-pay | Admitting: Internal Medicine

## 2010-09-14 DIAGNOSIS — E785 Hyperlipidemia, unspecified: Secondary | ICD-10-CM

## 2010-09-14 DIAGNOSIS — I10 Essential (primary) hypertension: Secondary | ICD-10-CM

## 2010-09-14 NOTE — Telephone Encounter (Signed)
Lipid and CBC  Ordered, if not done, please arrange

## 2010-09-15 NOTE — Telephone Encounter (Signed)
CBC hypertension, FLP hyperlipidemia

## 2010-09-15 NOTE — Telephone Encounter (Signed)
Future order placed 

## 2010-09-15 NOTE — Telephone Encounter (Signed)
Spoke w/ pt she states that she is seeing Dr.Ellison at the end of the month-- please give dx codes and they can be done when she sees him.

## 2010-09-23 ENCOUNTER — Ambulatory Visit (INDEPENDENT_AMBULATORY_CARE_PROVIDER_SITE_OTHER): Payer: Medicare Other | Admitting: *Deleted

## 2010-09-23 DIAGNOSIS — I4892 Unspecified atrial flutter: Secondary | ICD-10-CM

## 2010-09-23 DIAGNOSIS — I4891 Unspecified atrial fibrillation: Secondary | ICD-10-CM

## 2010-09-23 DIAGNOSIS — Z7901 Long term (current) use of anticoagulants: Secondary | ICD-10-CM | POA: Insufficient documentation

## 2010-09-23 LAB — POCT INR: INR: 3

## 2010-09-25 ENCOUNTER — Other Ambulatory Visit: Payer: Self-pay | Admitting: Internal Medicine

## 2010-09-29 ENCOUNTER — Encounter: Payer: Self-pay | Admitting: Endocrinology

## 2010-09-29 ENCOUNTER — Telehealth: Payer: Self-pay | Admitting: Endocrinology

## 2010-09-29 ENCOUNTER — Ambulatory Visit (INDEPENDENT_AMBULATORY_CARE_PROVIDER_SITE_OTHER): Payer: Medicare Other | Admitting: Endocrinology

## 2010-09-29 ENCOUNTER — Other Ambulatory Visit (INDEPENDENT_AMBULATORY_CARE_PROVIDER_SITE_OTHER): Payer: Medicare Other

## 2010-09-29 VITALS — BP 138/76 | HR 66 | Temp 98.2°F | Ht 69.0 in | Wt 228.0 lb

## 2010-09-29 DIAGNOSIS — E119 Type 2 diabetes mellitus without complications: Secondary | ICD-10-CM

## 2010-09-29 DIAGNOSIS — E785 Hyperlipidemia, unspecified: Secondary | ICD-10-CM

## 2010-09-29 DIAGNOSIS — I10 Essential (primary) hypertension: Secondary | ICD-10-CM

## 2010-09-29 LAB — CBC WITH DIFFERENTIAL/PLATELET
Basophils Absolute: 0 K/uL (ref 0.0–0.1)
Basophils Relative: 0.6 % (ref 0.0–3.0)
Eosinophils Absolute: 0.2 K/uL (ref 0.0–0.7)
Eosinophils Relative: 2.2 % (ref 0.0–5.0)
HCT: 39 % (ref 36.0–46.0)
Hemoglobin: 12.9 g/dL (ref 12.0–15.0)
Lymphocytes Relative: 32.1 % (ref 12.0–46.0)
Lymphs Abs: 2.6 K/uL (ref 0.7–4.0)
MCHC: 33 g/dL (ref 30.0–36.0)
MCV: 91.8 fl (ref 78.0–100.0)
Monocytes Absolute: 0.7 K/uL (ref 0.1–1.0)
Monocytes Relative: 8.6 % (ref 3.0–12.0)
Neutro Abs: 4.6 K/uL (ref 1.4–7.7)
Neutrophils Relative %: 56.5 % (ref 43.0–77.0)
Platelets: 263 K/uL (ref 150.0–400.0)
RBC: 4.25 Mil/uL (ref 3.87–5.11)
RDW: 13.7 % (ref 11.5–14.6)
WBC: 8.1 K/uL (ref 4.5–10.5)

## 2010-09-29 LAB — LIPID PANEL
Cholesterol: 156 mg/dL (ref 0–200)
LDL Cholesterol: 86 mg/dL (ref 0–99)
Total CHOL/HDL Ratio: 3
Triglycerides: 107 mg/dL (ref 0.0–149.0)
VLDL: 21.4 mg/dL (ref 0.0–40.0)

## 2010-09-29 MED ORDER — INSULIN NPH (HUMAN) (ISOPHANE) 100 UNIT/ML ~~LOC~~ SUSP: 90.0000 [IU] | Freq: Every day | SUBCUTANEOUS | Status: DC

## 2010-09-29 NOTE — Progress Notes (Signed)
Subjective:    Patient ID: [name] [name], female    DOB: 07/31/1940, 70 y.o.   MRN: [id number]  HPI pt states she feels well in general.  Since the insulin was changed, she has no further hypoglycemia.  She says cbg's are 80-100 in am, and 160-180 in the afternoon Past Medical History  Diagnosis Date  . Blindness of left eye   . Eye muscle weakness     Right eye weakness after cataract surgery  . Common migraine     ?hx of  . DIABETES MELLITUS, TYPE II 03/27/2006  . HYPERLIPIDEMIA 03/27/2006  . 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 HYPERTENSION 03/27/2006  . Atrial fibrillation     SVT dx 2007, cath 2007 mild CAD, then had an cardioversion, ablation; still on coumadin , has occ palpitation, EKG 03-2010 NSR  . Headache 05/07/2009  . LUNG NODULE 09/01/2006    Excision, Bx Benighn  . Osteopenia 2004    Dexa 2004 showed Osteopenia, DEXA 03/2007 normal    Past Surgical History  Procedure Date  . Removal of lung mass     History   Social History  . Marital Status: Married    Spouse Name: N/A    Number of Children: 1  . Years of Education: N/A   Occupational History  .     Social History Main Topics  . Smoking status: Former Smoker    Quit date: 03/02/1978  . Smokeless tobacco: Not on file  . Alcohol Use: No  . Drug Use: No  . Sexually Active:    Other Topics Concern  . Not on file   Social History Narrative   Moved from [place] in the 80's    Current Outpatient Prescriptions on File Prior to Visit  Medication Sig Dispense Refill  . aspirin 325 MG tablet Take 325 mg by mouth daily.        . calcium-vitamin D (OSCAL WITH D 500-200) 500-200 MG-UNIT per tablet Take 1 tablet by mouth daily.        [name] diltiazem (CARDIZEM) 120 MG tablet Take 1 tablet (120 mg total) by mouth daily.  90 tablet  3  . insulin NPH (HUMULIN N) 100 UNIT/ML injection 70 units each morning, and 15 units pm  30 mL  11  . irbesartan (AVAPRO) 150 MG tablet Take 1 tablet (150 mg total)  by mouth daily.  90 tablet  3  . Multiple Vitamin (MULTIVITAMIN) tablet Take 1 tablet by mouth daily.        . pravastatin (PRAVACHOL) 40 MG tablet TAKE ONE TABLET BY MOUTH AT BEDTIME  30 tablet  3  . warfarin (COUMADIN) 5 MG tablet Take 1 tablet (5 mg total) by mouth as directed.  90 tablet  0    Allergies  Allergen Reactions  . Procaine Hcl     REACTION: anaphylaxis    Family History  Problem Relation Age of Onset  . Diabetes Father   . Heart attack Father 71  . Diabetes Sister   . Cancer Neg Hx     no hx of colon or breast cancer    BP 138/76  Pulse 66  Temp(Src) 98.2 F (36.8 C) (Oral)  Ht 5\' 9"  (1.753 m)  Wt 228 lb (103.42 kg)  BMI 33.67 kg/m2  SpO2 96%    Review of Systems Denies loc    Objective:   Physical Exam Pulses: dorsalis pedis intact bilat.   Feet: no deformity.  no ulcer on the feet.  feet are of normal color and temp.  no edema Neuro: sensation is intact to touch on the feet     Assessment & Plan:   No problem-specific assessment & plan notes found for this encounter. dm.  therapy limited by pt's request for least expensive meds, and her need for a simple regimen.

## 2010-09-29 NOTE — Patient Instructions (Addendum)
blood tests are being ordered for you today.  please call [phone] to hear your test results. pending the test results, please change your insulin to 80 units each morning, and 5 units each evening. Please reuturn here in 4 months. check your blood sugar 2 times a day.  vary the time of day when you check, between before the 3 meals, and at bedtime.  also check if you have symptoms of your blood sugar being too high or too low.  please keep a record of the readings and bring it to your next appointment here.  please call us sooner if you are having low blood sugar episodes. good diet and exercise habits significanly improve the control of your diabetes.  please let me know if you wish to be referred to a dietician.  high blood sugar is very risky to your health.  you should see an eye doctor every year. controlling your blood pressure and cholesterol drastically reduces the damage diabetes does to your body.  this also applies to quitting smoking.  please discuss these with your doctor.  you should take an aspirin every day, unless you have been advised by a doctor not to.

## 2010-09-29 NOTE — Telephone Encounter (Signed)
i left message on phone tree Increase insulin to 90 am and none pm

## 2010-10-21 ENCOUNTER — Encounter: Payer: Medicare Other | Admitting: *Deleted

## 2010-10-24 ENCOUNTER — Ambulatory Visit (INDEPENDENT_AMBULATORY_CARE_PROVIDER_SITE_OTHER): Payer: Medicare Other | Admitting: *Deleted

## 2010-10-24 DIAGNOSIS — I4891 Unspecified atrial fibrillation: Secondary | ICD-10-CM

## 2010-10-24 DIAGNOSIS — I4892 Unspecified atrial flutter: Secondary | ICD-10-CM

## 2010-10-24 DIAGNOSIS — Z7901 Long term (current) use of anticoagulants: Secondary | ICD-10-CM

## 2010-11-21 ENCOUNTER — Ambulatory Visit (INDEPENDENT_AMBULATORY_CARE_PROVIDER_SITE_OTHER): Payer: Medicare Other | Admitting: *Deleted

## 2010-11-21 DIAGNOSIS — I4892 Unspecified atrial flutter: Secondary | ICD-10-CM

## 2010-11-21 DIAGNOSIS — I4891 Unspecified atrial fibrillation: Secondary | ICD-10-CM

## 2010-11-21 DIAGNOSIS — Z7901 Long term (current) use of anticoagulants: Secondary | ICD-10-CM

## 2010-11-21 LAB — POCT INR: INR: 1.9

## 2010-12-05 ENCOUNTER — Other Ambulatory Visit: Payer: Self-pay | Admitting: Internal Medicine

## 2010-12-05 NOTE — Telephone Encounter (Signed)
Appointment pending 01/2011

## 2010-12-15 ENCOUNTER — Encounter (HOSPITAL_BASED_OUTPATIENT_CLINIC_OR_DEPARTMENT_OTHER): Payer: Self-pay | Admitting: *Deleted

## 2010-12-15 ENCOUNTER — Emergency Department (INDEPENDENT_AMBULATORY_CARE_PROVIDER_SITE_OTHER): Payer: Medicare Other

## 2010-12-15 ENCOUNTER — Emergency Department (HOSPITAL_BASED_OUTPATIENT_CLINIC_OR_DEPARTMENT_OTHER)
Admission: EM | Admit: 2010-12-15 | Discharge: 2010-12-16 | Disposition: A | Discharge status: Home / Self Care | Payer: Medicare Other | Attending: Emergency Medicine | Admitting: Emergency Medicine

## 2010-12-15 DIAGNOSIS — S52123A Displaced fracture of head of unspecified radius, initial encounter for closed fracture: Secondary | ICD-10-CM

## 2010-12-15 DIAGNOSIS — M25429 Effusion, unspecified elbow: Secondary | ICD-10-CM

## 2010-12-15 DIAGNOSIS — W010XXA Fall on same level from slipping, tripping and stumbling without subsequent striking against object, initial encounter: Secondary | ICD-10-CM | POA: Insufficient documentation

## 2010-12-15 DIAGNOSIS — W19XXXA Unspecified fall, initial encounter: Secondary | ICD-10-CM

## 2010-12-15 DIAGNOSIS — M7989 Other specified soft tissue disorders: Secondary | ICD-10-CM

## 2010-12-15 DIAGNOSIS — E785 Hyperlipidemia, unspecified: Secondary | ICD-10-CM | POA: Insufficient documentation

## 2010-12-15 DIAGNOSIS — E119 Type 2 diabetes mellitus without complications: Secondary | ICD-10-CM | POA: Insufficient documentation

## 2010-12-15 DIAGNOSIS — S52133A Displaced fracture of neck of unspecified radius, initial encounter for closed fracture: Secondary | ICD-10-CM | POA: Insufficient documentation

## 2010-12-15 DIAGNOSIS — I4891 Unspecified atrial fibrillation: Secondary | ICD-10-CM | POA: Insufficient documentation

## 2010-12-15 DIAGNOSIS — M25569 Pain in unspecified knee: Secondary | ICD-10-CM | POA: Insufficient documentation

## 2010-12-15 DIAGNOSIS — M79609 Pain in unspecified limb: Secondary | ICD-10-CM | POA: Diagnosis present

## 2010-12-15 DIAGNOSIS — S52122A Displaced fracture of head of left radius, initial encounter for closed fracture: Secondary | ICD-10-CM

## 2010-12-15 DIAGNOSIS — M25539 Pain in unspecified wrist: Secondary | ICD-10-CM

## 2010-12-15 DIAGNOSIS — Y92009 Unspecified place in unspecified non-institutional (private) residence as the place of occurrence of the external cause: Secondary | ICD-10-CM | POA: Insufficient documentation

## 2010-12-15 MED ORDER — HYDROCODONE-ACETAMINOPHEN 5-325 MG PO TABS
1.0000 | ORAL_TABLET | Freq: Once | ORAL | Status: AC
  Administered 2010-12-15: 1 via ORAL
  Filled 2010-12-15: qty 1

## 2010-12-15 MED ORDER — TETANUS-DIPHTHERIA TOXOIDS TD 5-2 LFU IM INJ
0.5000 mL | INJECTION | Freq: Once | INTRAMUSCULAR | Status: AC
  Administered 2010-12-15: 0.5 mL via INTRAMUSCULAR
  Filled 2010-12-15: qty 0.5

## 2010-12-15 NOTE — ED Notes (Signed)
Pt presents to ED today with c.o left elbos injury after being tripped by dogs.  Pt also has bruising and abrasions noted to nose and chin

## 2010-12-15 NOTE — ED Provider Notes (Signed)
History     CSN: [id number] Arrival date & time: 12/15/2010 10:49 PM  Chief Complaint  Patient presents with  . Arm Injury    (Consider location/radiation/quality/duration/timing/severity/associated sxs/prior treatment) HPI Comments: Patient complains of a mechanical fall that occurred earlier this afternoon around 4 PM.  She was at home watching her son's dogs, and tripped over the dogs.  She has abrasions to her face, but no loss of consciousness during the incident.  She primarily complains of left elbow pain and has some mild left wrist pain.  She was able to ambulate after the incident has had increasing difficulty with this since that time.  She has right knee pain and mild swelling and an abrasion over that knee.  No abdominal pain.  Patient is a 70 y.o. female presenting with arm injury. The history is provided by the patient. No language interpreter was used.  Arm Injury  The incident occurred today. The incident occurred at home. The injury mechanism was a fall. No protective equipment was used. Pertinent negatives include no chest pain, no abdominal pain, no nausea, no vomiting, no headaches and no cough. Her tetanus status is unknown. She has been behaving normally.    Past Medical History  Diagnosis Date  . Blindness of left eye   . Eye muscle weakness     Right eye weakness after cataract surgery  . Common migraine     ?hx of  . DIABETES MELLITUS, TYPE II 03/27/2006  . HYPERLIPIDEMIA 03/27/2006  . 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 HYPERTENSION 03/27/2006  . Atrial fibrillation     SVT dx 2007, cath 2007 mild CAD, then had an cardioversion, ablation; still on coumadin , has occ palpitation, EKG 03-2010 NSR  . Headache 05/07/2009  . LUNG NODULE 09/01/2006    Excision, Bx Benighn  . Osteopenia 2004    Dexa 2004 showed Osteopenia, DEXA 03/2007 normal    Past Surgical History  Procedure Date  . Removal of lung mass     Family History  Problem Relation Age of Onset    . Diabetes Father   . Heart attack Father 25  . Diabetes Sister   . Cancer Neg Hx     no hx of colon or breast cancer    History  Substance Use Topics  . Smoking status: Former Smoker    Quit date: 03/02/1978  . Smokeless tobacco: Not on file  . Alcohol Use: No    OB History    Grav Para Term Preterm Abortions TAB SAB Ect Mult Living                  Review of Systems  Constitutional: Negative.  Negative for fever and chills.  HENT: Negative.   Eyes: Negative.  Negative for discharge and redness.  Respiratory: Negative.  Negative for cough and shortness of breath.   Cardiovascular: Negative.  Negative for chest pain.  Gastrointestinal: Negative.  Negative for nausea, vomiting, abdominal pain and diarrhea.  Genitourinary: Negative.  Negative for dysuria and vaginal discharge.  Musculoskeletal: Positive for joint swelling and arthralgias. Negative for back pain.  Skin: Negative for color change and rash.  Neurological: Negative.  Negative for syncope and headaches.  Hematological: Negative.  Negative for adenopathy.  Psychiatric/Behavioral: Negative.  Negative for confusion.  All other systems reviewed and are negative.    Allergies  Procaine hcl  Home Medications     BP 156/55  Pulse 72  Temp(Src) 97.3 F (36.3 C) (Oral)  Resp 22  SpO2 96%  Physical Exam  Constitutional: She is oriented to person, place, and time. She appears well-developed and well-nourished.  Non-toxic appearance. She does not have a sickly appearance.  HENT:  Head: Normocephalic and atraumatic.  Eyes: Conjunctivae, EOM and lids are normal. Pupils are equal, round, and reactive to light. No scleral icterus.  Neck: Trachea normal and normal range of motion. Neck supple.  Cardiovascular: Normal rate and normal heart sounds.  An irregular rhythm present. Exam reveals no gallop and no friction rub.   No murmur heard. Pulmonary/Chest: Effort normal and breath sounds normal. No respiratory  distress. She has no wheezes. She has no rales. She exhibits no tenderness.  Abdominal: Soft. Normal appearance. There is no tenderness. There is no rebound, no guarding and no CVA tenderness.  Musculoskeletal: Normal range of motion.       Patient has pain to the left elbow on examination with mild swelling.  Patient has difficulty extending her arm fully due to the pain.  She has a palpable radial pulse and capillary refill less than 2 seconds.  On that side.  She has mild distal radial and ulnar tenderness on exam.  No snuffbox tenderness.  No tenderness over the actual digits.  Patient has mild right knee swelling with an abrasion over the knee but can flex and extend his knee without significant difficulty.  She did have difficulty bearing weight on it here in the emergency department.  Left knee has mild abrasion over it but no significant swelling and no pain on movement.  Neurological: She is alert and oriented to person, place, and time. She has normal strength.  Skin: Skin is warm, dry and intact. No rash noted.       Patient does have a mild abrasion to the nose and chin.  She also has mild abrasions to the palms of her hands.  Psychiatric: She has a normal mood and affect. Her behavior is normal. Judgment and thought content normal.    ED Course  Procedures (including critical care time)  Dg Elbow Complete Left  12/15/2010  *RADIOLOGY REPORT*  Clinical Data: Fall, left elbow pain.  LEFT ELBOW - COMPLETE 3+ VIEW  Comparison: None.  Findings: There is is a left radial neck fracture.  Large joint effusion.  No additional acute bony abnormality.  IMPRESSION: Left radial neck fracture.  Original Report Authenticated By: Raelyn Number, M.D.   Dg Wrist Complete Left  12/16/2010  *RADIOLOGY REPORT*  Clinical Data: Wrist pain after fall  LEFT WRIST - COMPLETE 3+ VIEW  Comparison: None.  Findings: Deformity of the distal left radial metaphysis consistent with old healed fracture deformity.   Diffuse bone demineralization. Degenerative changes in the radiocarpal, radial ulnar, and STT joints.  No acute fractures are demonstrated.  IMPRESSION: Degenerative changes and old fracture deformities.  No acute bony abnormalities demonstrated.  Original Report Authenticated By: Neale Burly, M.D.   Dg Knee Complete 4 Views Right  12/16/2010  *RADIOLOGY REPORT*  Clinical Data: Right knee pain and swelling after fall  RIGHT KNEE - COMPLETE 4+ VIEW  Comparison: None.  Findings: The right knee appears intact. No evidence of acute fracture or subluxation.  No focal bone lesions.  Bone matrix and cortex appear intact.  No abnormal radiopaque densities in the soft tissues.  No significant effusion.  Vascular calcifications.  IMPRESSION: No acute bony abnormalities demonstrated.  Original Report Authenticated By: Neale Burly, M.D.       MDM  Patient with  radial neck fracture which we treated with a sling at this point in time.  Patient's pain is well controlled here with the Norco in the emergency department.  I will discharge patient home with followup with orthopedics this week in their office for further evaluation.  She has previously seen [facility] orthopedics and would like to go back to them.  Patient otherwise has no other significant trauma.  She is in her normal mental status.        Lezlie Octave, MD 12/16/10 [phone]

## 2010-12-16 MED ORDER — HYDROCODONE-ACETAMINOPHEN 5-325 MG PO TABS: 1.0000 | ORAL_TABLET | ORAL | Status: AC | PRN

## 2010-12-18 LAB — BASIC METABOLIC PANEL
BUN: 9
CO2: 32
Calcium: 8.7
Calcium: 9
Chloride: 98
Chloride: 98
Creatinine, Ser: 1.12
GFR calc Af Amer: 56 — ABNORMAL LOW
GFR calc non Af Amer: 47 — ABNORMAL LOW
Glucose, Bld: 179 — ABNORMAL HIGH
Glucose, Bld: 206 — ABNORMAL HIGH
Glucose, Bld: 220 — ABNORMAL HIGH
Potassium: 4
Potassium: 4.3
Sodium: 132 — ABNORMAL LOW
Sodium: 135

## 2010-12-18 LAB — PROTIME-INR
INR: 2.4 — ABNORMAL HIGH
INR: 2.7 — ABNORMAL HIGH
Prothrombin Time: 21.5 — ABNORMAL HIGH

## 2010-12-18 LAB — CBC
HCT: 34.9 — ABNORMAL LOW
Hemoglobin: 11.7 — ABNORMAL LOW
RBC: 3.9
RDW: 14.4 — ABNORMAL HIGH

## 2010-12-19 ENCOUNTER — Encounter: Payer: Medicare Other | Admitting: *Deleted

## 2010-12-30 ENCOUNTER — Ambulatory Visit (INDEPENDENT_AMBULATORY_CARE_PROVIDER_SITE_OTHER): Payer: Medicare Other | Admitting: *Deleted

## 2010-12-30 DIAGNOSIS — Z7901 Long term (current) use of anticoagulants: Secondary | ICD-10-CM

## 2010-12-30 DIAGNOSIS — I4891 Unspecified atrial fibrillation: Secondary | ICD-10-CM

## 2010-12-30 DIAGNOSIS — I4892 Unspecified atrial flutter: Secondary | ICD-10-CM

## 2010-12-30 LAB — POCT INR: INR: 2.4

## 2011-01-06 ENCOUNTER — Other Ambulatory Visit: Payer: Self-pay | Admitting: Internal Medicine

## 2011-01-07 MED ORDER — PRAVASTATIN SODIUM 40 MG PO TABS: 40.0000 mg | ORAL_TABLET | Freq: Every day | ORAL | Status: DC

## 2011-01-07 NOTE — Telephone Encounter (Signed)
Rx for pravastatin sent to pharmacy

## 2011-01-27 ENCOUNTER — Encounter: Payer: Medicare Other | Admitting: *Deleted

## 2011-02-04 ENCOUNTER — Encounter: Payer: Medicare Other | Admitting: Internal Medicine

## 2011-02-05 ENCOUNTER — Ambulatory Visit (INDEPENDENT_AMBULATORY_CARE_PROVIDER_SITE_OTHER): Payer: Medicare Other | Admitting: *Deleted

## 2011-02-05 DIAGNOSIS — I4891 Unspecified atrial fibrillation: Secondary | ICD-10-CM

## 2011-02-05 DIAGNOSIS — I4892 Unspecified atrial flutter: Secondary | ICD-10-CM

## 2011-02-05 DIAGNOSIS — Z7901 Long term (current) use of anticoagulants: Secondary | ICD-10-CM

## 2011-02-05 LAB — POCT INR: INR: 2.7

## 2011-02-10 ENCOUNTER — Ambulatory Visit (INDEPENDENT_AMBULATORY_CARE_PROVIDER_SITE_OTHER): Payer: Medicare Other | Admitting: Internal Medicine

## 2011-02-10 ENCOUNTER — Encounter: Payer: Self-pay | Admitting: Internal Medicine

## 2011-02-10 ENCOUNTER — Encounter: Payer: Self-pay | Admitting: *Deleted

## 2011-02-10 DIAGNOSIS — I10 Essential (primary) hypertension: Secondary | ICD-10-CM

## 2011-02-10 DIAGNOSIS — E119 Type 2 diabetes mellitus without complications: Secondary | ICD-10-CM

## 2011-02-10 DIAGNOSIS — Z0000 Encounter for general adult medical examination without abnormal findings: Secondary | ICD-10-CM | POA: Insufficient documentation

## 2011-02-10 DIAGNOSIS — E785 Hyperlipidemia, unspecified: Secondary | ICD-10-CM

## 2011-02-10 DIAGNOSIS — M899 Disorder of bone, unspecified: Secondary | ICD-10-CM

## 2011-02-10 LAB — BASIC METABOLIC PANEL
BUN: 19 mg/dL (ref 6–23)
Calcium: 9.1 mg/dL (ref 8.4–10.5)
Chloride: 99 mEq/L (ref 96–112)
Potassium: 4.4 mEq/L (ref 3.5–5.1)
Sodium: 135 mEq/L (ref 135–145)

## 2011-02-10 NOTE — Assessment & Plan Note (Signed)
Reportedly f/u by gyn

## 2011-02-10 NOTE — Assessment & Plan Note (Signed)
Per endocrinology 

## 2011-02-10 NOTE — Progress Notes (Signed)
  Subjective:    Patient ID: [name] [name], female    DOB: [birth year], 70 y.o.   MRN: [id number]  HPI   History of Present Illness: Here for Medicare AWV: 1. Risk factors based on Past M, S, F history: REVIEWED  2. Physical Activities: active at home, used to walks daily 3. Depression/mood: stressed, husband in the hospital  4. Hearing: normal. no problems noted at normal conversation  5. ADL's: totally independent  6. Fall Risk: low risk, broke her arm 10-12, accidental fall (trip on one of her son's puppies)   7. Home Safety: DOES FEEL SAFE AT HOME  8. Height, weight, &visual acuity: see VS, vision "not good" from previous laser and cataract                       suergery but stable, last visit w/ ophtalmology 1.5 years ago , was told "nothing can be done" , still sees the optometrist 9. Counseling: yes  10. Labs ordered based on risk factors: YES  11.           Referral Coordination, if needed  12.           Care Plan, see a/p  13.            Cognitive Assessment -- motor skills, memory and cognition seem adequate  in addition, we discussed the following DIABETES-- per Dr Melodye Ped -- good med compliance w/ zocor, no myalgias per se  HYPERTENSION  -- ambulatory BPs varies , mostly WNL  ATRIAL FLUTTER, PAROXYSMAL  -- still on coumadin, to see Dr Lovena Le 03-2010   Past Medical History: DIABETES MELLITUS, w/ NEUROPATHY RETINOPATHY , worse after B laser surgery (actually blind on the L) HYPERLIPIDEMIA  HYPERTENSION   ATRIAL FLUTTER, PAROXYSMAL SVT dx 2007, cath 2007 mild CAD, then had an cardioversion, ablation; still on coumadin , has occ palpitation, EKG 03-2010 NSR  LUNG NODULE EXCISION 2008, bX BENIGN  OSTEOPENIA?, DEXA 2004 showed osteopenia, DEXA  03-2007 normal ? of COMMON MIGRAINE   Arm Fx 12-2010, no surgery  Past Surgical History: removal of lung mass  Family History: DM-- F and sisters MI-- F at age 49 colon ca--no breast ca--no  Social  History: Married, 1 son  moved from [place]-- quit in the 80s ETOH-- rarely    Review of Systems  Constitutional: Negative for fever and fatigue.  Respiratory: Negative for cough and shortness of breath.   Cardiovascular: Negative for chest pain.  Gastrointestinal: Negative for abdominal pain and blood in stool.  Genitourinary: Negative for hematuria and difficulty urinating.       Objective:   Physical Exam  Constitutional: She is oriented to person, place, and time. She appears well-developed. No distress.  HENT:  Head: Normocephalic and atraumatic.  Neck: No thyromegaly present.       Normal carotid pulse  Cardiovascular: Normal rate, regular rhythm and normal heart sounds.   No murmur heard. Pulmonary/Chest: Effort normal and breath sounds normal. No respiratory distress. She has no wheezes. She has no rales.  Abdominal: Soft. She exhibits no distension. There is no tenderness. There is no rebound and no guarding.  Musculoskeletal: She exhibits no edema.  Neurological: She is alert and oriented to person, place, and time.  Psychiatric: She has a normal mood and affect. Her behavior is normal. Judgment and thought content normal.      Assessment & Plan:

## 2011-02-10 NOTE — Patient Instructions (Signed)
See your gynecologist every year You are due for a mammogram Return the "stool card"

## 2011-02-10 NOTE — Assessment & Plan Note (Addendum)
Td 2009 Consistently declined immunizations: explained benefits of shingles shot , flu and pneumonia shot   Recommend to see  Dr Toney Rakes   last MMG 01-2010 , neg   colonoscopy: never had one and is not interested , had  a neg hemocults 9-09, 04-2009 issued iFOB  Diet-exercise discussed

## 2011-02-10 NOTE — Assessment & Plan Note (Signed)
Well controlled 

## 2011-02-13 ENCOUNTER — Encounter: Payer: Self-pay | Admitting: *Deleted

## 2011-03-05 ENCOUNTER — Encounter: Payer: Medicare Other | Admitting: *Deleted

## 2011-03-10 ENCOUNTER — Ambulatory Visit (INDEPENDENT_AMBULATORY_CARE_PROVIDER_SITE_OTHER): Payer: Medicare Other | Admitting: *Deleted

## 2011-03-10 DIAGNOSIS — I4891 Unspecified atrial fibrillation: Secondary | ICD-10-CM

## 2011-03-10 DIAGNOSIS — Z7901 Long term (current) use of anticoagulants: Secondary | ICD-10-CM

## 2011-03-10 DIAGNOSIS — I4892 Unspecified atrial flutter: Secondary | ICD-10-CM

## 2011-04-07 ENCOUNTER — Ambulatory Visit (INDEPENDENT_AMBULATORY_CARE_PROVIDER_SITE_OTHER): Payer: Medicare Other | Admitting: *Deleted

## 2011-04-07 DIAGNOSIS — Z7901 Long term (current) use of anticoagulants: Secondary | ICD-10-CM

## 2011-04-07 DIAGNOSIS — I4892 Unspecified atrial flutter: Secondary | ICD-10-CM

## 2011-04-07 DIAGNOSIS — I4891 Unspecified atrial fibrillation: Secondary | ICD-10-CM

## 2011-04-09 ENCOUNTER — Encounter: Payer: Self-pay | Admitting: Endocrinology

## 2011-04-09 ENCOUNTER — Ambulatory Visit (INDEPENDENT_AMBULATORY_CARE_PROVIDER_SITE_OTHER): Payer: Medicare Other | Admitting: Endocrinology

## 2011-04-09 ENCOUNTER — Other Ambulatory Visit (INDEPENDENT_AMBULATORY_CARE_PROVIDER_SITE_OTHER): Payer: Medicare Other

## 2011-04-09 VITALS — BP 122/60 | HR 64 | Temp 96.5°F | Ht 69.0 in | Wt 220.0 lb

## 2011-04-09 DIAGNOSIS — L301 Dyshidrosis [pompholyx]: Secondary | ICD-10-CM

## 2011-04-09 DIAGNOSIS — E119 Type 2 diabetes mellitus without complications: Secondary | ICD-10-CM

## 2011-04-09 MED ORDER — TRIAMCINOLONE ACETONIDE 0.025 % EX OINT: TOPICAL_OINTMENT | Freq: Three times a day (TID) | CUTANEOUS | Status: DC | PRN

## 2011-04-09 NOTE — Progress Notes (Signed)
Subjective:    Patient ID: [name] [name], female    DOB: 01-Apr-1940, 71 y.o.   MRN: [id number]  HPI Pt returns for f/u of insulin-requiring DM (1992).  no cbg record, but states cbg was 63 in am recently.  Otherwise, cbg is well-controlled.  She takes 50 units qam, and 30-40 in the evening.  Pt states few mos of slight rash on the legs, and assoc itching Past Medical History  Diagnosis Date  . Blindness of left eye   . Eye muscle weakness     Right eye weakness after cataract surgery  . Common migraine     ?hx of  . DIABETES MELLITUS, TYPE II 03/27/2006  . HYPERLIPIDEMIA 03/27/2006  . 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 HYPERTENSION 03/27/2006  . Atrial fibrillation     SVT dx 2007, cath 2007 mild CAD, then had an cardioversion, ablation; still on coumadin , has occ palpitation, EKG 03-2010 NSR  . Headache 05/07/2009  . LUNG NODULE 09/01/2006    Excision, Bx Benighn  . Osteopenia 2004    Dexa 2004 showed Osteopenia, DEXA 03/2007 normal    Past Surgical History  Procedure Date  . Removal of lung mass     History   Social History  . Marital Status: Married    Spouse Name: N/A    Number of Children: 1  . Years of Education: N/A   Occupational History  .     Social History Main Topics  . Smoking status: Former Smoker    Quit date: 03/02/1978  . Smokeless tobacco: Not on file  . Alcohol Use: No  . Drug Use: No  . Sexually Active:    Other Topics Concern  . Not on file   Social History Narrative   Moved from [place] in the 80's    Current Outpatient Prescriptions on File Prior to Visit  Medication Sig Dispense Refill  . aspirin 325 MG tablet Take 650 mg by mouth daily.       . calcium-vitamin D (OSCAL WITH D 500-200) 500-200 MG-UNIT per tablet Take 1 tablet by mouth daily.        [name] diltiazem (CARDIZEM) 120 MG tablet Take 1 tablet (120 mg total) by mouth daily.  90 tablet  3  . insulin NPH (HUMULIN N,NOVOLIN N) 100 UNIT/ML injection Inject into  the skin. Give 70 units in the morning and 10 units in the afternoon      . irbesartan (AVAPRO) 150 MG tablet Take 1 tablet (150 mg total) by mouth daily.  90 tablet  3  . Multiple Vitamin (MULTIVITAMIN) tablet Take 1 tablet by mouth daily.        . pravastatin (PRAVACHOL) 40 MG tablet Take 1 tablet (40 mg total) by mouth daily.  30 tablet  3  . warfarin (COUMADIN) 5 MG tablet Take 5 mg by mouth as directed. Take 1 tab on Wed and Fri. Take 1/2 tab on Mon, Tues, Thurs, Sat and Sun         Allergies  Allergen Reactions  . Procaine Hcl Anaphylaxis    Family History  Problem Relation Age of Onset  . Diabetes Father   . Heart attack Father 74  . Diabetes Sister   . Cancer Neg Hx     no hx of colon or breast cancer    BP 122/60  Pulse 64  Temp(Src) 96.5 F (35.8 C) (Oral)  Ht 5\' 9"  (1.753 m)  Wt 220 lb (99.791 kg)  BMI  32.49 kg/m2  SpO2 95%    Review of Systems Denies fever    Objective:   Physical Exam VITAL SIGNS:  See vs page GENERAL: no distress Pulses: dorsalis pedis intact bilat.   Feet: no deformity.  no ulcer on the feet.  feet are of normal color and temp.  no edema Neuro: sensation is intact to touch on the feet Skin:  Mild eczematous rash on the feet.     Lab Results  Component Value Date   HGBA1C 8.3* 04/09/2011      Assessment & Plan:  DM, needs increased rx Dyshdrosis, new

## 2011-04-09 NOTE — Patient Instructions (Addendum)
blood tests are being requested for you today.  please call [phone] to hear your test results.  You will be prompted to enter the 9-digit "MRN" number that appears at the top left of this page, followed by #.  Then you will hear the message. pending the test results, please change your insulin to 70 units each morning, and 10 in the evening.   Please reuturn here in 4 months. check your blood sugar 2 times a day.  vary the time of day when you check, between before the 3 meals, and at bedtime.  also check if you have symptoms of your blood sugar being too high or too low.  please keep a record of the readings and bring it to your next appointment here.  please call us sooner if you are having low blood sugar episodes.   i have sent a prescription to your pharmacy, for an anti-itch cream. (update: i left message on phone-tree:  rx as we discussed)

## 2011-04-28 ENCOUNTER — Ambulatory Visit (INDEPENDENT_AMBULATORY_CARE_PROVIDER_SITE_OTHER): Payer: Medicare Other | Admitting: *Deleted

## 2011-04-28 DIAGNOSIS — I4892 Unspecified atrial flutter: Secondary | ICD-10-CM

## 2011-04-28 DIAGNOSIS — I4891 Unspecified atrial fibrillation: Secondary | ICD-10-CM

## 2011-04-28 DIAGNOSIS — Z7901 Long term (current) use of anticoagulants: Secondary | ICD-10-CM

## 2011-05-07 ENCOUNTER — Other Ambulatory Visit: Payer: Self-pay | Admitting: Internal Medicine

## 2011-05-07 NOTE — Telephone Encounter (Signed)
Refill done.  

## 2011-05-16 ENCOUNTER — Encounter (HOSPITAL_BASED_OUTPATIENT_CLINIC_OR_DEPARTMENT_OTHER): Payer: Self-pay | Admitting: *Deleted

## 2011-05-16 ENCOUNTER — Emergency Department (INDEPENDENT_AMBULATORY_CARE_PROVIDER_SITE_OTHER): Payer: Medicare Other

## 2011-05-16 ENCOUNTER — Other Ambulatory Visit: Payer: Self-pay

## 2011-05-16 ENCOUNTER — Inpatient Hospital Stay (HOSPITAL_BASED_OUTPATIENT_CLINIC_OR_DEPARTMENT_OTHER)
Admission: EM | Admit: 2011-05-16 | Discharge: 2011-05-19 | DRG: 948 | Disposition: A | Discharge status: Home / Self Care | Payer: Medicare Other | Attending: Internal Medicine | Admitting: Internal Medicine

## 2011-05-16 DIAGNOSIS — E119 Type 2 diabetes mellitus without complications: Secondary | ICD-10-CM

## 2011-05-16 DIAGNOSIS — I4891 Unspecified atrial fibrillation: Secondary | ICD-10-CM

## 2011-05-16 DIAGNOSIS — Z7982 Long term (current) use of aspirin: Secondary | ICD-10-CM

## 2011-05-16 DIAGNOSIS — H5440 Blindness, one eye, unspecified eye: Secondary | ICD-10-CM | POA: Diagnosis present

## 2011-05-16 DIAGNOSIS — R110 Nausea: Secondary | ICD-10-CM

## 2011-05-16 DIAGNOSIS — F29 Unspecified psychosis not due to a substance or known physiological condition: Secondary | ICD-10-CM

## 2011-05-16 DIAGNOSIS — I517 Cardiomegaly: Secondary | ICD-10-CM

## 2011-05-16 DIAGNOSIS — Z2989 Encounter for other specified prophylactic measures: Secondary | ICD-10-CM

## 2011-05-16 DIAGNOSIS — G9389 Other specified disorders of brain: Secondary | ICD-10-CM

## 2011-05-16 DIAGNOSIS — I10 Essential (primary) hypertension: Secondary | ICD-10-CM

## 2011-05-16 DIAGNOSIS — J984 Other disorders of lung: Secondary | ICD-10-CM

## 2011-05-16 DIAGNOSIS — Z7901 Long term (current) use of anticoagulants: Secondary | ICD-10-CM

## 2011-05-16 DIAGNOSIS — M899 Disorder of bone, unspecified: Secondary | ICD-10-CM

## 2011-05-16 DIAGNOSIS — J9819 Other pulmonary collapse: Secondary | ICD-10-CM

## 2011-05-16 DIAGNOSIS — R51 Headache: Secondary | ICD-10-CM

## 2011-05-16 DIAGNOSIS — R4182 Altered mental status, unspecified: Principal | ICD-10-CM

## 2011-05-16 DIAGNOSIS — G609 Hereditary and idiopathic neuropathy, unspecified: Secondary | ICD-10-CM

## 2011-05-16 DIAGNOSIS — E785 Hyperlipidemia, unspecified: Secondary | ICD-10-CM

## 2011-05-16 DIAGNOSIS — R519 Headache, unspecified: Secondary | ICD-10-CM | POA: Diagnosis present

## 2011-05-16 DIAGNOSIS — R112 Nausea with vomiting, unspecified: Secondary | ICD-10-CM

## 2011-05-16 DIAGNOSIS — R509 Fever, unspecified: Secondary | ICD-10-CM

## 2011-05-16 DIAGNOSIS — H3500 Unspecified background retinopathy: Secondary | ICD-10-CM

## 2011-05-16 DIAGNOSIS — E876 Hypokalemia: Secondary | ICD-10-CM

## 2011-05-16 DIAGNOSIS — I289 Disease of pulmonary vessels, unspecified: Secondary | ICD-10-CM

## 2011-05-16 DIAGNOSIS — Z418 Encounter for other procedures for purposes other than remedying health state: Secondary | ICD-10-CM

## 2011-05-16 DIAGNOSIS — Z794 Long term (current) use of insulin: Secondary | ICD-10-CM

## 2011-05-16 DIAGNOSIS — Z79899 Other long term (current) drug therapy: Secondary | ICD-10-CM

## 2011-05-16 DIAGNOSIS — E871 Hypo-osmolality and hyponatremia: Secondary | ICD-10-CM

## 2011-05-16 LAB — COMPREHENSIVE METABOLIC PANEL
Albumin: 3.4 g/dL — ABNORMAL LOW (ref 3.5–5.2)
Alkaline Phosphatase: 83 U/L (ref 39–117)
BUN: 13 mg/dL (ref 6–23)
Chloride: 91 mEq/L — ABNORMAL LOW (ref 96–112)
Glucose, Bld: 213 mg/dL — ABNORMAL HIGH (ref 70–99)
Potassium: 3.7 mEq/L (ref 3.5–5.1)
Total Bilirubin: 0.3 mg/dL (ref 0.3–1.2)

## 2011-05-16 LAB — CBC
HCT: 37.1 % (ref 36.0–46.0)
Hemoglobin: 12.6 g/dL (ref 12.0–15.0)
MCH: 29 pg (ref 26.0–34.0)
MCHC: 34 g/dL (ref 30.0–36.0)
MCV: 85.3 fL (ref 78.0–100.0)
Platelets: 322 K/uL (ref 150–400)
RBC: 4.35 MIL/uL (ref 3.87–5.11)
RDW: 14.2 % (ref 11.5–15.5)
WBC: 7.2 10*3/uL (ref 4.0–10.5)

## 2011-05-16 LAB — COMPREHENSIVE METABOLIC PANEL WITH GFR
ALT: 10 U/L (ref 0–35)
AST: 22 U/L (ref 0–37)
CO2: 28 meq/L (ref 19–32)
Calcium: 9.4 mg/dL (ref 8.4–10.5)
Creatinine, Ser: 1.1 mg/dL (ref 0.50–1.10)
GFR calc Af Amer: 58 mL/min — ABNORMAL LOW (ref >90)
GFR calc non Af Amer: 50 mL/min — ABNORMAL LOW (ref >90)
Sodium: 131 meq/L — ABNORMAL LOW (ref 135–145)
Total Protein: 7.7 g/dL (ref 6.0–8.3)

## 2011-05-16 LAB — PROTIME-INR
INR: 1.63 — ABNORMAL HIGH (ref 0.00–1.49)
Prothrombin Time: 19.6 s — ABNORMAL HIGH (ref 11.6–15.2)

## 2011-05-16 LAB — DIFFERENTIAL
Basophils Absolute: 0 K/uL (ref 0.0–0.1)
Basophils Relative: 1 % (ref 0–1)
Eosinophils Absolute: 0 K/uL (ref 0.0–0.7)
Eosinophils Relative: 1 % (ref 0–5)
Lymphocytes Relative: 18 % (ref 12–46)
Lymphs Abs: 1.3 10*3/uL (ref 0.7–4.0)
Monocytes Absolute: 0.7 10*3/uL (ref 0.1–1.0)
Monocytes Relative: 10 % (ref 3–12)
Neutro Abs: 5.2 K/uL (ref 1.7–7.7)
Neutrophils Relative %: 71 % (ref 43–77)

## 2011-05-16 LAB — GLUCOSE, CAPILLARY
Glucose-Capillary: 219 mg/dL — ABNORMAL HIGH (ref 70–99)
Glucose-Capillary: 209 mg/dL — ABNORMAL HIGH (ref 70–99)
Glucose-Capillary: 238 mg/dL — ABNORMAL HIGH (ref 70–99)
Glucose-Capillary: 229 mg/dL — ABNORMAL HIGH (ref 70–99)
Glucose-Capillary: 216 mg/dL — ABNORMAL HIGH (ref 70–99)

## 2011-05-16 LAB — TROPONIN I: Troponin I: <0.3 ng/mL (ref <0.30)

## 2011-05-16 LAB — APTT: aPTT: 39 seconds — ABNORMAL HIGH (ref 24–37)

## 2011-05-16 MED ORDER — ONDANSETRON HCL 4 MG/2ML IJ SOLN
INTRAMUSCULAR | Status: AC
  Administered 2011-05-16: 4 mg via INTRAVENOUS
  Filled 2011-05-16: qty 2

## 2011-05-16 MED ORDER — ALUM & MAG HYDROXIDE-SIMETH 200-200-20 MG/5ML PO SUSP
30.0000 mL | Freq: Four times a day (QID) | ORAL | Status: DC | PRN
  Filled 2011-05-16: qty 30

## 2011-05-16 MED ORDER — ONDANSETRON HCL 4 MG PO TABS: 4.0000 mg | ORAL_TABLET | Freq: Four times a day (QID) | ORAL | Status: DC | PRN

## 2011-05-16 MED ORDER — ACETAMINOPHEN 325 MG PO TABS
650.0000 mg | ORAL_TABLET | Freq: Four times a day (QID) | ORAL | Status: DC | PRN
  Administered 2011-05-16: 650 mg via ORAL
  Filled 2011-05-16: qty 2

## 2011-05-16 MED ORDER — ZOLPIDEM TARTRATE 5 MG PO TABS: 5.0000 mg | ORAL_TABLET | Freq: Every evening | ORAL | Status: DC | PRN

## 2011-05-16 MED ORDER — SENNA 8.6 MG PO TABS
1.0000 | ORAL_TABLET | Freq: Two times a day (BID) | ORAL | Status: DC
  Administered 2011-05-17 – 2011-05-19 (×5): 8.6 mg via ORAL
  Filled 2011-05-16 (×7): qty 1

## 2011-05-16 MED ORDER — ONDANSETRON HCL 4 MG/2ML IJ SOLN
INTRAMUSCULAR | Status: AC
  Filled 2011-05-16: qty 2

## 2011-05-16 MED ORDER — SODIUM CHLORIDE 0.9 % IV SOLN
INTRAVENOUS | Status: DC
  Administered 2011-05-16: 1000 mL via INTRAVENOUS
  Administered 2011-05-17 – 2011-05-19 (×3): via INTRAVENOUS

## 2011-05-16 MED ORDER — ONDANSETRON HCL 4 MG/2ML IJ SOLN
4.0000 mg | Freq: Four times a day (QID) | INTRAMUSCULAR | Status: DC | PRN
  Administered 2011-05-17: 4 mg via INTRAVENOUS
  Filled 2011-05-16 (×2): qty 2

## 2011-05-16 MED ORDER — OXYCODONE HCL 5 MG PO TABS: 5.0000 mg | ORAL_TABLET | ORAL | Status: DC | PRN

## 2011-05-16 MED ORDER — ONDANSETRON HCL 4 MG/2ML IJ SOLN
4.0000 mg | Freq: Once | INTRAMUSCULAR | Status: AC
  Administered 2011-05-16: 4 mg via INTRAVENOUS

## 2011-05-16 MED ORDER — NALOXONE HCL 1 MG/ML IJ SOLN
1.0000 mg | Freq: Once | INTRAMUSCULAR | Status: AC
  Administered 2011-05-16: 1 mg via INTRAVENOUS
  Filled 2011-05-16: qty 2

## 2011-05-16 MED ORDER — FENTANYL CITRATE 0.05 MG/ML IJ SOLN
25.0000 ug | Freq: Once | INTRAMUSCULAR | Status: AC
  Administered 2011-05-16: 25 ug via INTRAVENOUS
  Filled 2011-05-16: qty 2

## 2011-05-16 MED ORDER — ONDANSETRON HCL 4 MG/2ML IJ SOLN
4.0000 mg | Freq: Once | INTRAMUSCULAR | Status: AC
  Administered 2011-05-16: 4 mg via INTRAVENOUS
  Filled 2011-05-16: qty 2

## 2011-05-16 MED ORDER — INSULIN ASPART 100 UNIT/ML ~~LOC~~ SOLN
0.0000 [IU] | Freq: Three times a day (TID) | SUBCUTANEOUS | Status: DC
  Administered 2011-05-17 (×2): via SUBCUTANEOUS
  Administered 2011-05-17: 2 [IU] via SUBCUTANEOUS
  Administered 2011-05-18: 5 [IU] via SUBCUTANEOUS
  Administered 2011-05-18: 7 [IU] via SUBCUTANEOUS
  Administered 2011-05-18: 5 [IU] via SUBCUTANEOUS
  Administered 2011-05-19: 3 [IU] via SUBCUTANEOUS
  Administered 2011-05-19: 5 [IU] via SUBCUTANEOUS

## 2011-05-16 MED ORDER — ACETAMINOPHEN 650 MG RE SUPP: 650.0000 mg | Freq: Four times a day (QID) | RECTAL | Status: DC | PRN

## 2011-05-16 MED ORDER — HYDROMORPHONE HCL PF 1 MG/ML IJ SOLN
0.5000 mg | INTRAMUSCULAR | Status: DC | PRN
  Administered 2011-05-17: 1 mg via INTRAVENOUS
  Filled 2011-05-16 (×2): qty 1

## 2011-05-16 MED ORDER — SODIUM CHLORIDE 0.9 % IV BOLUS (SEPSIS)
1000.0000 mL | Freq: Once | INTRAVENOUS | Status: AC
  Administered 2011-05-16: 1000 mL via INTRAVENOUS

## 2011-05-16 MED ORDER — ACETAMINOPHEN 325 MG PO TABS
650.0000 mg | ORAL_TABLET | Freq: Four times a day (QID) | ORAL | Status: DC | PRN
  Filled 2011-05-16: qty 2

## 2011-05-16 MED ORDER — INSULIN ASPART 100 UNIT/ML ~~LOC~~ SOLN
0.0000 [IU] | Freq: Every day | SUBCUTANEOUS | Status: DC
  Administered 2011-05-17 – 2011-05-18 (×2): 2 [IU] via SUBCUTANEOUS

## 2011-05-16 MED ORDER — DOCUSATE SODIUM 100 MG PO CAPS
100.0000 mg | ORAL_CAPSULE | Freq: Two times a day (BID) | ORAL | Status: DC
  Administered 2011-05-17 – 2011-05-19 (×5): 100 mg via ORAL
  Filled 2011-05-16 (×6): qty 1

## 2011-05-16 MED ORDER — ACETAMINOPHEN 325 MG PO TABS
650.0000 mg | ORAL_TABLET | Freq: Once | ORAL | Status: AC
  Administered 2011-05-16: 650 mg via ORAL
  Filled 2011-05-16: qty 2

## 2011-05-16 NOTE — ED Provider Notes (Cosign Needed)
History     CSN: [id number]  Arrival date & time 05/16/11  1041   First MD Initiated Contact with Patient 05/16/11 1131      Chief Complaint  Patient presents with  . Nausea  . Emesis    (Consider location/radiation/quality/duration/timing/severity/associated sxs/prior treatment) HPI Comments: Patient presented from home with complaints of some mild fevers, persistent headache diffusely for the past 3 days but no sinus problems, sore throat, cough. She reports she does not usually have headaches. She has a history of hypertension and diabetes and she is currently on Coumadin for a history of atrial fibrillation. This morning, the patient developed 3 episodes of vomiting and generalized weakness and thus presented to the emergency department. When I came into the room however, the patient seemed more obtunded with at the moment, low oxygen saturations in the mid 80s although her oxygen level was improving as the patient's nurse had repositioned her pulse oximeter and placed her on nasal cannula oxygen. The patient was rales some to tactile stimulation. Her pupils were slightly pinpoint. Narcan was given without any significant change or improvement of her symptoms. The patient's family had since arrived as well who reports that the patient was not taking any current pain medication on a daily basis. She did not smoke or drink. They deny that she had had any head trauma at the time. They report that she has not had any obvious cold symptoms and had not been complaining of any chest pain or abdominal pain.  Level 5 caveat due to altered mentation and condition of patient.    Patient is a 71 y.o. female presenting with vomiting. The history is provided by the patient and a relative.  Emesis     Past Medical History  Diagnosis Date  . Blindness of left eye   . Eye muscle weakness     Right eye weakness after cataract surgery  . Common migraine     ?hx of  . DIABETES MELLITUS, TYPE II  03/27/2006  . HYPERLIPIDEMIA 03/27/2006  . 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 HYPERTENSION 03/27/2006  . Atrial fibrillation     SVT dx 2007, cath 2007 mild CAD, then had an cardioversion, ablation; still on coumadin , has occ palpitation, EKG 03-2010 NSR  . Headache 05/07/2009  . LUNG NODULE 09/01/2006    Excision, Bx Benighn  . Osteopenia 2004    Dexa 2004 showed Osteopenia, DEXA 03/2007 normal    History reviewed. No pertinent past surgical history.  Family History  Problem Relation Age of Onset  . Diabetes Father   . Heart attack Father 55  . Diabetes Sister   . Cancer Neg Hx     no hx of colon or breast cancer    History  Substance Use Topics  . Smoking status: Former Smoker    Quit date: 03/02/1978  . Smokeless tobacco: Not on file  . Alcohol Use: No    OB History    Grav Para Term Preterm Abortions TAB SAB Ect Mult Living                  Review of Systems  Unable to perform ROS: Mental status change  Gastrointestinal: Positive for vomiting.    Allergies  Procaine hcl  Home Medications   Current Outpatient Rx  Name Route Sig Dispense Refill  . ASPIRIN 325 MG PO TABS Oral Take 650 mg by mouth daily.     [name] CALCIUM CARBONATE-VITAMIN D 500-200 MG-UNIT PO TABS Oral Take  1 tablet by mouth daily.      [name] DILTIAZEM HCL 120 MG PO TABS Oral Take 1 tablet (120 mg total) by mouth daily. 90 tablet 3    DUE FOR APPOINTMENT  . GLUCOSAMINE 1500 COMPLEX PO Oral Take by mouth.    . INSULIN ISOPHANE HUMAN 100 UNIT/ML [place] SUSP Subcutaneous Inject into the skin. Give 70 units in the morning and 10 units in the afternoon    . IRBESARTAN 150 MG PO TABS Oral Take 1 tablet (150 mg total) by mouth daily. 90 tablet 3  . ONE-DAILY MULTI VITAMINS PO TABS Oral Take 1 tablet by mouth daily.      [name] PRAVASTATIN SODIUM 40 MG PO TABS  TAKE ONE TABLET BY MOUTH EVERY DAY 30 tablet 6  . TRIAMCINOLONE ACETONIDE 0.025 % EX OINT Topical Apply topically 3 (three) times daily as needed. For itching  30 g 2  . WARFARIN SODIUM 5 MG PO TABS Oral Take 5 mg by mouth as directed. Take 1 tab on Wed and Fri. Take 1/2 tab on Mon, Tues, Thurs, Sat and Sun  Anti-coagulant medication.  Patient needs a consult.      BP 164/55  Pulse 77  Temp(Src) 100.1 F (37.8 C) (Rectal)  Resp 21  Ht 5\' 9"  (1.753 m)  Wt 220 lb (99.791 kg)  BMI 32.49 kg/m2  SpO2 98%  Physical Exam  Nursing note and vitals reviewed. Constitutional: She appears well-developed and well-nourished. She appears distressed.  HENT:  Head: Normocephalic and atraumatic.  Eyes: Pupils are equal, round, and reactive to light.  Neck: Normal range of motion. Neck supple.  Cardiovascular: Normal rate.   No murmur heard. Pulmonary/Chest: Effort normal. She has no wheezes. She has no rales.  Abdominal: Soft.  Neurological: She is alert. She has normal strength. No cranial nerve deficit or sensory deficit.       No arm drift, normal finger to nose bilaterally, 5/5 strength of hip flexors and grip bilaterally.    Skin: Skin is warm and dry. She is not diaphoretic.  Psychiatric: Her mood appears not anxious. Her speech is delayed. Her speech is not slurred. She is slowed. She exhibits abnormal recent memory.    ED Course  Procedures (including critical care time)  Labs Reviewed  COMPREHENSIVE METABOLIC PANEL - Abnormal; Notable for the following:    Sodium 131 (*)    Chloride 91 (*)    Glucose, Bld 213 (*)    Albumin 3.4 (*)    GFR calc non Af Amer 50 (*)    GFR calc Af Amer 58 (*)    All other components within normal limits  APTT - Abnormal; Notable for the following:    aPTT 39 (*)    All other components within normal limits  PROTIME-INR - Abnormal; Notable for the following:    Prothrombin Time 19.6 (*)    INR 1.63 (*)    All other components within normal limits  GLUCOSE, CAPILLARY - Abnormal; Notable for the following:    Glucose-Capillary 219 (*)    All other components within normal limits  GLUCOSE, CAPILLARY -  Abnormal; Notable for the following:    Glucose-Capillary 209 (*)    All other components within normal limits  CBC  DIFFERENTIAL  TROPONIN I   Ct Head Wo Contrast  05/16/2011  *RADIOLOGY REPORT*  Clinical Data: Nausea, vomiting  CT HEAD WITHOUT CONTRAST  Technique:  Contiguous axial images were obtained from the base of the skull through the  vertex without contrast.  Comparison: CT 05/05/2010  Findings: No acute intracranial hemorrhage.  No focal mass lesion. There is a new cortical hypodensity within the inferior medial left temporal lobe (image 10).  No additional evidence infarction.  No evidence of intracranial hemorrhage.  Paranasal sinuses and mastoid air cells are clear.  Orbits are normal.  IMPRESSION:  New cortical hypodensity within the inferior medial temporal lobe of unclear etiology.  Recommend MRI if the patient has symptoms of acute infarction.  Original Report Authenticated By: Suzy Bouchard, M.D.   I reviewd head CT above myself.  1. Altered mental status   2. Diabetes mellitus   3. Nausea and vomiting     EKG performed at time 12:17, shows normal sinus rhythm at a rate of 74. Borderline prolonged PR interval at 208 ms. Normal axis, normal ST and T wave segments. No significant change compared to EKG from 07/23/2006.   4:18 PM Spoke to Dr. Algis Liming with [facility] who accepts pt in transfer to [facility] for altered mentation, vomiting.  MRI has been done, results pending.      MDM   Patient seen initially somewhat obtunded with low oxygen saturations, however this may have been due to air and monitoring. Her saturations improved quite quickly with repositioning her pulse oximeter. Narcan was given without any true improvement of her mentation. Her mentation did slowly improve I think on its own. Her blood tests showed no acute abnormalities other than a slightly elevated glucose which I find unremarkable given her history of diabetes. She did have a low-grade elevated temperature  of 99.5. She does not have a stiff neck. Her head CT showed a small area of concern for possible stroke. Her gross neurologic exam however is unremarkable other than her slight changes in mental status. The plan is to obtain a noncontrast brain MRI to assess for possible small stroke. She is supposed to be on Coumadin for atrial for relation and she is subtherapeutic. However she is in sinus rhythm at this moment. At this time her abdomen is soft, and her nausea and vomiting is improved with administration of IV fluids and IV Zofran. My plan is to check a rectal temperature as she does have episodes of some shaking which could be riders. Also obtain a chest x-ray while we await the brain MRI        Saddie Benders. Kalei Mckillop, MD 05/16/11 [id number]

## 2011-05-16 NOTE — Progress Notes (Signed)
Patient arrived to floor via [facility]. Patient husband and grandson at bedside. Patient oriented to call light/bell system. Incontinent of bladder x1. Patient vomited 50-49mL bile in appearance. Patient instructed to call for assistance getting up.

## 2011-05-16 NOTE — ED Notes (Signed)
accucheck blood glucose  219

## 2011-05-16 NOTE — ED Notes (Signed)
accucheck cbg 209

## 2011-05-16 NOTE — H&P (Signed)
DATE OF ADMISSION:  05/16/2011  PCP:    [name], MD, MD   Chief Complaint:  Confusion, Headache and Nausea   HPI: [name] [name] is an 71 y.o. female seen at the [facility] ED and transferred to [facility] for persstent headache   And episodes of nausea and vomiing X 3 days patient denies fevers but reports chills.  In the ED a CT scan of the brain and MRI study was performed and revealed a area of concern (enhancement in the left temoral lobe).  Patient was placed in droplet isolation for possible viral meningitis, and Infectious Dieases was contacted at that time.  .    Past Medical History  Diagnosis Date  . Blindness of left eye   . Eye muscle weakness     Right eye weakness after cataract surgery  . Common migraine     ?hx of  . DIABETES MELLITUS, TYPE II 03/27/2006  . HYPERLIPIDEMIA 03/27/2006  . 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 HYPERTENSION 03/27/2006  . Atrial fibrillation     SVT dx 2007, cath 2007 mild CAD, then had an cardioversion, ablation; still on coumadin , has occ palpitation, EKG 03-2010 NSR  . Headache 05/07/2009  . LUNG NODULE 09/01/2006    Excision, Bx Benighn  . Osteopenia 2004    Dexa 2004 showed Osteopenia, DEXA 03/2007 normal    History reviewed. No pertinent past surgical history.  Medications:  HOME MEDS: Prior to Admission medications   Medication Sig Start Date End Date Taking? Authorizing Provider  aspirin 325 MG tablet Take 650 mg by mouth daily.    Yes Historical Provider, MD  calcium-vitamin D (OSCAL WITH D 500-200) 500-200 MG-UNIT per tablet Take 1 tablet by mouth daily.     Yes Historical Provider, MD  diltiazem (CARDIZEM) 120 MG tablet Take 1 tablet (120 mg total) by mouth daily. 09/04/10  Yes Colon Branch, MD  Glucosamine-Chondroit-Vit C-Mn (GLUCOSAMINE 1500 COMPLEX PO) Take by mouth.   Yes Historical Provider, MD  insulin NPH (HUMULIN N,NOVOLIN N) 100 UNIT/ML injection Inject into the skin. Give 70 units in the morning and 10 units in the  afternoon 09/29/10  Yes Renato Shin, MD  irbesartan (AVAPRO) 150 MG tablet Take 1 tablet (150 mg total) by mouth daily. 09/04/10  Yes Colon Branch, MD  Multiple Vitamin (MULTIVITAMIN) tablet Take 1 tablet by mouth daily.     Yes Historical Provider, MD  pravastatin (PRAVACHOL) 40 MG tablet TAKE ONE TABLET BY MOUTH EVERY DAY 05/07/11  Yes Colon Branch, MD  triamcinolone (KENALOG) 0.025 % ointment Apply topically 3 (three) times daily as needed. For itching 04/09/11 04/08/12 Yes Renato Shin, MD  warfarin (COUMADIN) 5 MG tablet Take 5 mg by mouth as directed. Take 1 tab on Wed and Fri. Take 1/2 tab on Mon, Tues, Thurs, Sat and Sun  Anti-coagulant medication.  Patient needs a consult. 07/17/10  Yes Evans Lance, MD    Allergies:  Allergies  Allergen Reactions  . Procaine Hcl Anaphylaxis    Social History:   reports that she quit smoking about 33 years ago. She does not have any smokeless tobacco history on file. She reports that she does not drink alcohol or use illicit drugs.  Family History: Family History  Problem Relation Age of Onset  . Diabetes Father   . Heart attack Father 55  . Diabetes Sister   . Cancer Neg Hx     no hx of colon or breast cancer  Review of Systems:  The patient denies anorexia, fever, weight loss,, vision loss, decreased hearing, hoarseness, chest pain, syncope, dyspnea on exertion, peripheral edema, balance deficits, hemoptysis, abdominal pain, melena, hematochezia, severe indigestion/heartburn, hematuria, incontinence, genital sores, muscle weakness, suspicious skin lesions, transient blindness, difficulty walking, depression, unusual weight change, abnormal bleeding, enlarged lymph nodes, angioedema, and breast masses.   Physical Exam:  GEN:  Pleasant examined  and in no acute distress; cooperative with exam Filed Vitals:   05/16/11 1405 05/16/11 1444 05/16/11 1448 05/16/11 1859  BP:   164/55 181/60  Pulse: 74  77 84  Temp:  100.1 F (37.8 C)  98 F (36.7 C)   TempSrc:  Rectal  Oral  Resp: 21   19  Height:      Weight:      SpO2: 98%  98% 96%   Blood pressure 181/60, pulse 84, temperature 98 F (36.7 C), temperature source Oral, resp. rate 19, height 5\' 9"  (1.753 m), weight 99.791 kg (220 lb), SpO2 96.00%. PSYCH: SHe is alert and oriented x4; does not appear anxious does not appear depressed; affect is normal HEENT: Normocephalic and Atraumatic, Mucous membranes pink; PERRLA; EOM intact; Fundi:  Benign;  No scleral icterus, Nares: Patent, Oropharynx: Clear, Edentulous or Fair Dentition, Neck:  FROM, no cervical lymphadenopathy nor thyromegaly or carotid bruit; no JVD; Breasts:: Not examined CHEST WALL: No tenderness CHEST: Normal respiration, clear to auscultation bilaterally HEART: Regular rate and rhythm; no murmurs rubs or gallops BACK: No kyphosis or scoliosis; no CVA tenderness ABDOMEN: Positive Bowel Sounds, Scaphoid, Obese, soft non-tender; no masses, no organomegaly, no pannus; no intertriginous candida. Rectal Exam: Not done EXTREMITIES: No bone or joint deformity; age-appropriate arthropathy of the hands and knees; no cyanosis, clubbing or edema; no ulcerations. Genitalia: not examined PULSES: 2+ and symmetric SKIN: Normal hydration no rash or ulceration CNS: Cranial nerves 2-12 grossly intact no focal neurologic deficit   Labs & Imaging Results for orders placed during the hospital encounter of 05/16/11 (from the past 48 hour(s))  CBC     Status: Normal   Collection Time   05/16/11 11:40 AM      Component Value Range Comment   WBC 7.2  4.0 - 10.5 (K/uL)    RBC 4.35  3.87 - 5.11 (MIL/uL)    Hemoglobin 12.6  12.0 - 15.0 (g/dL)    HCT 37.1  36.0 - 46.0 (%)    MCV 85.3  78.0 - 100.0 (fL)    MCH 29.0  26.0 - 34.0 (pg)    MCHC 34.0  30.0 - 36.0 (g/dL)    RDW 14.2  11.5 - 15.5 (%)    Platelets 322  150 - 400 (K/uL)   DIFFERENTIAL     Status: Normal   Collection Time   05/16/11 11:40 AM      Component Value Range Comment    Neutrophils Relative 71  43 - 77 (%)    Neutro Abs 5.2  1.7 - 7.7 (K/uL)    Lymphocytes Relative 18  12 - 46 (%)    Lymphs Abs 1.3  0.7 - 4.0 (K/uL)    Monocytes Relative 10  3 - 12 (%)    Monocytes Absolute 0.7  0.1 - 1.0 (K/uL)    Eosinophils Relative 1  0 - 5 (%)    Eosinophils Absolute 0.0  0.0 - 0.7 (K/uL)    Basophils Relative 1  0 - 1 (%)    Basophils Absolute 0.0  0.0 - 0.1 (K/uL)  COMPREHENSIVE METABOLIC PANEL     Status: Abnormal   Collection Time   05/16/11 11:40 AM      Component Value Range Comment   Sodium 131 (*) 135 - 145 (mEq/L)    Potassium 3.7  3.5 - 5.1 (mEq/L)    Chloride 91 (*) 96 - 112 (mEq/L)    CO2 28  19 - 32 (mEq/L)    Glucose, Bld 213 (*) 70 - 99 (mg/dL)    BUN 13  6 - 23 (mg/dL)    Creatinine, Ser 1.10  0.50 - 1.10 (mg/dL)    Calcium 9.4  8.4 - 10.5 (mg/dL)    Total Protein 7.7  6.0 - 8.3 (g/dL)    Albumin 3.4 (*) 3.5 - 5.2 (g/dL)    AST 22  0 - 37 (U/L)    ALT 10  0 - 35 (U/L)    Alkaline Phosphatase 83  39 - 117 (U/L)    Total Bilirubin 0.3  0.3 - 1.2 (mg/dL)    GFR calc non Af Amer 50 (*) >90 (mL/min)    GFR calc Af Amer 58 (*) >90 (mL/min)   APTT     Status: Abnormal   Collection Time   05/16/11 11:40 AM      Component Value Range Comment   aPTT 39 (*) 24 - 37 (seconds)   PROTIME-INR     Status: Abnormal   Collection Time   05/16/11 11:40 AM      Component Value Range Comment   Prothrombin Time 19.6 (*) 11.6 - 15.2 (seconds)    INR 1.63 (*) 0.00 - 1.49    TROPONIN I     Status: Normal   Collection Time   05/16/11 11:40 AM      Component Value Range Comment   Troponin I <0.30  <0.30 (ng/mL)   GLUCOSE, CAPILLARY     Status: Abnormal   Collection Time   05/16/11 11:58 AM      Component Value Range Comment   Glucose-Capillary 219 (*) 70 - 99 (mg/dL)   GLUCOSE, CAPILLARY     Status: Abnormal   Collection Time   05/16/11  2:36 PM      Component Value Range Comment   Glucose-Capillary 209 (*) 70 - 99 (mg/dL)   GLUCOSE, CAPILLARY     Status:  Abnormal   Collection Time   05/16/11  5:50 PM      Component Value Range Comment   Glucose-Capillary 238 (*) 70 - 99 (mg/dL)    Dg Chest 2 View  05/16/2011  *RADIOLOGY REPORT*  Clinical Data: Headache and weakness.  Vomiting and fever.  CHEST - 2 VIEW  Comparison: Chest x-ray 12/01/2006.  Findings: The heart is enlarged.  Mild bibasilar airspace disease likely reflects atelectasis.  There is mild pulmonary vascular congestion.  Postsurgical changes are noted in the right chest.  A previous thoracotomy has healed.  IMPRESSION:  1.  Cardiomegaly with mild pulmonary vascular congestion. 2.  Mild bibasilar atelectasis. 3.  Postoperative changes of the right chest.  Original Report Authenticated By: Resa Miner. MATTERN, M.D.   Ct Head Wo Contrast  05/16/2011  *RADIOLOGY REPORT*  Clinical Data: Nausea, vomiting  CT HEAD WITHOUT CONTRAST  Technique:  Contiguous axial images were obtained from the base of the skull through the vertex without contrast.  Comparison: CT 05/05/2010  Findings: No acute intracranial hemorrhage.  No focal mass lesion. There is a new cortical hypodensity within the inferior medial left temporal lobe (image 10).  No  additional evidence infarction.  No evidence of intracranial hemorrhage.  Paranasal sinuses and mastoid air cells are clear.  Orbits are normal.  IMPRESSION:  New cortical hypodensity within the inferior medial temporal lobe of unclear etiology.  Recommend MRI if the patient has symptoms of acute infarction.  Original Report Authenticated By: Suzy Bouchard, M.D.   Mr Brain Wo Contrast  05/16/2011  *RADIOLOGY REPORT*  Clinical Data: Nausea and headache.  Altered mental status.  Fever.  MRI HEAD WITHOUT CONTRAST  Technique:  Multiplanar, multiecho pulse sequences of the brain and surrounding structures were obtained according to standard protocol without intravenous contrast.  Comparison: CT head without contrast 05/16/2011.  Findings: A focal area of T2 and FLAIR  hyperintensity is present within the posterior medial left temporal lobe, along the body and tail of the hippocampus.  There is slight T2 hyperintensity along the undersurface of the right temporal lobe.  No hemorrhage or mass lesion is present.  There is no restricted diffusion associated with these areas.  Other scattered periventricular and subcortical T2 and FLAIR hyperintensities are present in the frontal parietal lobes bilaterally.  Flow is present in the major intracranial arteries.  The patient is status post bilateral lens extractions.  The globes and orbits are otherwise intact.  The paranasal sinuses and mastoid air cells are clear.  IMPRESSION:  1.  Abnormal signal within the posterior left hippocampus and undersurface of the right temporal lobe.  This raises the possibility of herpes encephalitis.  The differential diagnosis includes a low grade glioma on the left.  A para neoplastic syndrome could have this appearance.  The left-sided appearance could be seen in the setting of recent or ongoing seizure activity. 2.  Mild small vessel disease.  These results were called by telephone on 05/16/2011  at  05:40 p.m. to  Dr. Lauris Poag, who verbally acknowledged these results.  Original Report Authenticated By: Resa Miner. MATTERN, M.D.      Assessment: Present on Admission:  .Altered mental status .Headache .Nausea and vomiting .DIABETES MELLITUS, TYPE II .HYPERTENSION .Atrial fibrillation .HYPERLIPIDEMIA .Long term current use of anticoagulant .PERIPHERAL NEUROPATHY   Plan:  Admit to Telemetry Bed Droplet Isolation Precautions, Infectious Diseases Consulted Patient is currently lucid, hold on LP for now.   Pain Control Antiemetics, Clear liquid Diet SSI Coverage Coumadin management Other plans as per orders.    CODE STATUS:      FULL CODE               Martie Muhlbauer C 05/16/2011, 8:56 PM

## 2011-05-16 NOTE — ED Notes (Signed)
Patient became nauseous while at MRI location, given 4mg  zofran per MD verbal order

## 2011-05-16 NOTE — ED Notes (Signed)
Placed bedside toilet in room and assisted patient to ambulate in order to urinate.

## 2011-05-16 NOTE — ED Provider Notes (Signed)
Patient had been accepted by the triad hospitalist from Dr. Dorna Mai   MRI of brain was completed.  Received call from Dr Jobe Igo from Radiology re:  Abn MRI.  Differential would include herpes encephalitis, paraneoplastic syndrome, possible glioma.   The triad hospitalist, was contacted. I have also paged the infectious disease service, and await their recommendations. Be sure that the proper cautions are instituted.  Also, obtain recommendation of starting IV acyclovir as requested by the hospitalist.  ===  Infectious disease, Dr. Megan Salon.discussed.  Recommends NOT holding up transfer.  Will need add'l studies at [facility].       Jahlisa Rossitto A. Lauris Poag, MD 05/16/11 1756

## 2011-05-16 NOTE — ED Notes (Signed)
Patient's family stated that patient needed to have an insulin shot, informed family that MD was aware of glucose level and would do everything possible to insure patient was comfortable and MD discussed with family

## 2011-05-16 NOTE — ED Notes (Signed)
Family at bedside. 

## 2011-05-16 NOTE — ED Notes (Signed)
EKG was done at 12:17 not at 12:27.

## 2011-05-16 NOTE — ED Notes (Signed)
Patient c/o HA for the past three days, vominting this morning, no fever, no diarrhea

## 2011-05-16 NOTE — ED Notes (Signed)
I gave the patient a small drink of water. She did fine with the drink. She stated that she is not willing to get up and walk at this time. She stated that her head is hurting to much, to try. Doctor Dorna Mai has been told of this.

## 2011-05-16 NOTE — ED Notes (Signed)
Assisted patient to restroom with wheelchair, patient stood by side of bed and stated she felt weak and had a HA

## 2011-05-17 ENCOUNTER — Encounter (HOSPITAL_COMMUNITY): Payer: Self-pay | Admitting: *Deleted

## 2011-05-17 ENCOUNTER — Other Ambulatory Visit: Payer: Self-pay

## 2011-05-17 ENCOUNTER — Inpatient Hospital Stay (HOSPITAL_COMMUNITY): Payer: Medicare Other

## 2011-05-17 DIAGNOSIS — R509 Fever, unspecified: Secondary | ICD-10-CM | POA: Diagnosis present

## 2011-05-17 DIAGNOSIS — E871 Hypo-osmolality and hyponatremia: Secondary | ICD-10-CM | POA: Diagnosis present

## 2011-05-17 DIAGNOSIS — A879 Viral meningitis, unspecified: Secondary | ICD-10-CM

## 2011-05-17 DIAGNOSIS — E876 Hypokalemia: Secondary | ICD-10-CM | POA: Diagnosis not present

## 2011-05-17 LAB — CBC
Hemoglobin: 12.2 g/dL (ref 12.0–15.0)
RBC: 4.26 MIL/uL (ref 3.87–5.11)

## 2011-05-17 LAB — GLUCOSE, CAPILLARY
Glucose-Capillary: 199 mg/dL — ABNORMAL HIGH (ref 70–99)
Glucose-Capillary: 214 mg/dL — ABNORMAL HIGH (ref 70–99)

## 2011-05-17 LAB — BASIC METABOLIC PANEL
GFR calc Af Amer: 68 mL/min — ABNORMAL LOW (ref >90)
GFR calc non Af Amer: 58 mL/min — ABNORMAL LOW (ref >90)
Potassium: 3.2 mEq/L — ABNORMAL LOW (ref 3.5–5.1)
Sodium: 131 mEq/L — ABNORMAL LOW (ref 135–145)

## 2011-05-17 MED ORDER — TRIAMCINOLONE ACETONIDE 0.025 % EX OINT: TOPICAL_OINTMENT | Freq: Two times a day (BID) | CUTANEOUS | Status: DC

## 2011-05-17 MED ORDER — PIPERACILLIN-TAZOBACTAM 3.375 G IVPB 30 MIN
3.3750 g | Freq: Once | INTRAVENOUS | Status: AC
  Administered 2011-05-17: 3.375 g via INTRAVENOUS
  Filled 2011-05-17: qty 50

## 2011-05-17 MED ORDER — SIMVASTATIN 20 MG PO TABS
20.0000 mg | ORAL_TABLET | Freq: Every day | ORAL | Status: DC
  Filled 2011-05-17: qty 1

## 2011-05-17 MED ORDER — DEXTROSE 5 % IV SOLN
10.0000 mg/kg | Freq: Three times a day (TID) | INTRAVENOUS | Status: DC
  Administered 2011-05-17 – 2011-05-18 (×2): 660 mg via INTRAVENOUS
  Filled 2011-05-17 (×4): qty 13.2

## 2011-05-17 MED ORDER — VANCOMYCIN HCL 1000 MG IV SOLR
1250.0000 mg | Freq: Two times a day (BID) | INTRAVENOUS | Status: DC
  Administered 2011-05-18 (×2): 1250 mg via INTRAVENOUS
  Filled 2011-05-17 (×3): qty 1250

## 2011-05-17 MED ORDER — PIPERACILLIN-TAZOBACTAM 3.375 G IVPB
3.3750 g | Freq: Three times a day (TID) | INTRAVENOUS | Status: DC
  Administered 2011-05-18: 3.375 g via INTRAVENOUS
  Filled 2011-05-17 (×3): qty 50

## 2011-05-17 MED ORDER — ASPIRIN 325 MG PO TABS
650.0000 mg | ORAL_TABLET | Freq: Every day | ORAL | Status: DC
  Administered 2011-05-18 – 2011-05-19 (×3): 650 mg via ORAL
  Filled 2011-05-17 (×3): qty 2

## 2011-05-17 MED ORDER — TRIAMCINOLONE ACETONIDE 0.025 % EX CREA
TOPICAL_CREAM | Freq: Two times a day (BID) | CUTANEOUS | Status: DC
  Filled 2011-05-17 (×2): qty 15

## 2011-05-17 MED ORDER — DILTIAZEM HCL ER COATED BEADS 120 MG PO CP24
120.0000 mg | ORAL_CAPSULE | Freq: Every day | ORAL | Status: DC
  Administered 2011-05-18 (×2): 120 mg via ORAL
  Filled 2011-05-17 (×2): qty 1

## 2011-05-17 MED ORDER — IRBESARTAN 150 MG PO TABS
150.0000 mg | ORAL_TABLET | Freq: Every day | ORAL | Status: DC
  Administered 2011-05-18 – 2011-05-19 (×3): 150 mg via ORAL
  Filled 2011-05-17 (×3): qty 1

## 2011-05-17 MED ORDER — CALCIUM CARBONATE-VITAMIN D 500-200 MG-UNIT PO TABS
1.0000 | ORAL_TABLET | Freq: Every day | ORAL | Status: DC
  Administered 2011-05-18 – 2011-05-19 (×2): 1 via ORAL
  Filled 2011-05-17 (×2): qty 1

## 2011-05-17 MED ORDER — WARFARIN SODIUM 6 MG PO TABS
6.0000 mg | ORAL_TABLET | Freq: Once | ORAL | Status: AC
  Administered 2011-05-17: 6 mg via ORAL
  Filled 2011-05-17: qty 1

## 2011-05-17 MED ORDER — DILTIAZEM HCL 60 MG PO TABS: 120.0000 mg | ORAL_TABLET | Freq: Every day | ORAL | Status: DC

## 2011-05-17 MED ORDER — GADOBENATE DIMEGLUMINE 529 MG/ML IV SOLN
20.0000 mL | Freq: Once | INTRAVENOUS | Status: AC | PRN
  Administered 2011-05-17: 20 mL via INTRAVENOUS

## 2011-05-17 MED ORDER — ADULT MULTIVITAMIN W/MINERALS CH
1.0000 | ORAL_TABLET | Freq: Every day | ORAL | Status: DC
  Administered 2011-05-18 – 2011-05-19 (×2): 1 via ORAL
  Filled 2011-05-17 (×2): qty 1

## 2011-05-17 MED ORDER — VITAMIN K1 10 MG/ML IJ SOLN
1.0000 mg | Freq: Once | INTRAVENOUS | Status: AC
  Administered 2011-05-18: 1 mg via INTRAVENOUS
  Filled 2011-05-17: qty 0.1

## 2011-05-17 MED ORDER — POTASSIUM CHLORIDE 10 MEQ/100ML IV SOLN
10.0000 meq | INTRAVENOUS | Status: AC
  Administered 2011-05-17 (×4): 10 meq via INTRAVENOUS
  Filled 2011-05-17 (×4): qty 100

## 2011-05-17 MED ORDER — ONE-DAILY MULTI VITAMINS PO TABS: 1.0000 | ORAL_TABLET | Freq: Every day | ORAL | Status: DC

## 2011-05-17 MED ORDER — WARFARIN - PHARMACIST DOSING INPATIENT: Freq: Every day | Status: DC

## 2011-05-17 NOTE — Progress Notes (Signed)
ANTICOAGULATION CONSULT NOTE - Initial Consult  Pharmacy Consult for Coumadin Indication: atrial fibrillation  Allergies  Allergen Reactions  . Procaine Hcl Anaphylaxis    Patient Measurements: Height: 5\' 9"  (175.3 cm) Weight: 220 lb (99.791 kg) IBW/kg (Calculated) : 66.2   Vital Signs: Temp: 100.9 F (38.3 C) (03/16 2124) Temp src: Axillary (03/16 2124) BP: 169/54 mmHg (03/16 2124) Pulse Rate: 82  (03/16 2124)  Labs:  Basename 05/16/11 1140  HGB 12.6  HCT 37.1  PLT 322  APTT 39*  LABPROT 19.6*  INR 1.63*  HEPARINUNFRC --  CREATININE 1.10  CKTOTAL --  CKMB --  TROPONINI <0.30   Estimated Creatinine Clearance: 60.7 ml/min (by C-G formula based on Cr of 1.1).  Medical History: Past Medical History  Diagnosis Date  . Blindness of left eye   . Eye muscle weakness     Right eye weakness after cataract surgery  . Common migraine     ?hx of  . DIABETES MELLITUS, TYPE II 03/27/2006  . HYPERLIPIDEMIA 03/27/2006  . 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 HYPERTENSION 03/27/2006  . Atrial fibrillation     SVT dx 2007, cath 2007 mild CAD, then had an cardioversion, ablation; still on coumadin , has occ palpitation, EKG 03-2010 NSR  . Headache 05/07/2009  . LUNG NODULE 09/01/2006    Excision, Bx Benighn  . Osteopenia 2004    Dexa 2004 showed Osteopenia, DEXA 03/2007 normal    Medications:  Prescriptions prior to admission  Medication Sig Dispense Refill  . aspirin 325 MG tablet Take 650 mg by mouth daily.       . calcium-vitamin D (OSCAL WITH D 500-200) 500-200 MG-UNIT per tablet Take 1 tablet by mouth daily.        [name] diltiazem (CARDIZEM) 120 MG tablet Take 1 tablet (120 mg total) by mouth daily.  90 tablet  3  . Glucosamine-Chondroit-Vit C-Mn (GLUCOSAMINE 1500 COMPLEX PO) Take by mouth.      . insulin NPH (HUMULIN N,NOVOLIN N) 100 UNIT/ML injection Inject into the skin. Give 70 units in the morning and 10 units in the afternoon      . irbesartan (AVAPRO) 150 MG tablet  Take 1 tablet (150 mg total) by mouth daily.  90 tablet  3  . Multiple Vitamin (MULTIVITAMIN) tablet Take 1 tablet by mouth daily.        . pravastatin (PRAVACHOL) 40 MG tablet TAKE ONE TABLET BY MOUTH EVERY DAY  30 tablet  6  . triamcinolone (KENALOG) 0.025 % ointment Apply topically 3 (three) times daily as needed. For itching  30 g  2  . warfarin (COUMADIN) 5 MG tablet Take 5 mg by mouth as directed. Take 1 tab on Wed and Fri. Take 1/2 tab on Mon, Tues, Thurs, Sat and Sun  Anti-coagulant medication.  Patient needs a consult.       Scheduled:    . acetaminophen  650 mg Oral Once  . docusate sodium  100 mg Oral BID  . fentaNYL  25 mcg Intravenous Once  . insulin aspart  0-5 Units Subcutaneous QHS  . insulin aspart  0-9 Units Subcutaneous TID WC  . naloxone (NARCAN) injection  1 mg Intravenous Once  . ondansetron (ZOFRAN) IV  4 mg Intravenous Once  . ondansetron (ZOFRAN) IV  4 mg Intravenous Once  . ondansetron (ZOFRAN) IV  4 mg Intravenous Once  . senna  1 tablet Oral BID  . sodium chloride  1,000 mL Intravenous Once  . warfarin  6 mg Oral ONCE-1800  . Warfarin - Pharmacist Dosing Inpatient   Does not apply q1800    Assessment: 71yo female admitted with AMS to continue Coumadin for Afib; INR subtherapeutic and missed 3/16 dose.  Goal of Therapy:  INR 2-3   Plan:  Will give boosted Coumadin dose of 6mg  x1 today and adjust doses per INR.  Rogue Bussing PharmD BCPS 05/17/2011,4:00 AM

## 2011-05-17 NOTE — Progress Notes (Signed)
[facility] hospitalist progress note Chief complaint. Altered mental status. History of present illness. This 71 year old female admitted with altered mental status and fever. Etiology as yet unclear. A left sided glioma with a MRI repeat ordered earlier today. Radiology contacted me about this study to clarify if indeed he needed to be done. I did ask him to proceed. Also a lumbar puncture was ordered earlier today. I just discussed this with radiology and unfortunately this will not be obtainable today. I asked the nursing staff to go ahead and initiate antibiotics as ordered. I note the patient is on Coumadin with the current INR about 1.7. I did the discontinue her Coumadin and ordered vitamin K 1 mg IV to reverse this pending a lumbar puncture. A repeat INR is scheduled for the a.m. I will need to follow for this result. Have requested the pharmacy start heparin when INR value is less than 1.5.

## 2011-05-17 NOTE — Progress Notes (Signed)
Patient ID: [name] [name], female   DOB: August 12, 1940, 71 y.o.   MRN: [id number]  Assessment/Plan:  Principal Problem:   *Altered mental status - unclear etiology but perhaps secondary to encephalitis versus glioma - completely resolved at this time - patient spiked fever so we will start broad spectrum antibiotics + acyclovir - due to fever, altered mental status and headache on admission we will obtain LP  - follow up results of LP including but HSV PCR - follow up blood cultures, urine culture - evaluate left side glioma with MRI brain with contrast  Active Problems:   Fever - spiked fever in last 24 hours - we will start vanco and zosyn + acyclovir - follow up blood culture, urine culture, urinalysis - follow up results of LP including but not limited to HSV PCR  DIABETES MELLITUS, TYPE II - sliding scale insulin - continue CBG monitoring   HYPERTENSION - restart home meds, cardizem and avapro - monitor BP - continue telemetry monitor   Atrial fibrillation - coumadin per pharmacy protocol   EDUCATION - test results and diagnostic studies were discussed with patient  at the bedside - patient has verbalized the understanding - questions were answered at the bedside and contact information was provided for additional questions or concerns    Subjective: No events overnight. Patient denies chest pain, shortness of breath, abdominal pain.   Objective:  Vital signs in last 24 hours:  Filed Vitals:   05/17/11 0530 05/17/11 0926 05/17/11 1300 05/17/11 1745  BP: 125/59 155/57 165/50 173/69  Pulse: 84 71 71 77  Temp: 98.6 F (37 C) 98.6 F (37 C) 98.1 F (36.7 C) 100.4 F (38 C)  TempSrc: Oral Oral Oral Axillary  Resp: 22 20 19 21   Height:      Weight:      SpO2: 97% 95% 97% 99%    Intake/Output from previous day:   Intake/Output Summary (Last 24 hours) at 05/17/11 1812 Last data filed at 05/17/11 1300  Gross per 24 hour  Intake    240 ml  Output       4 ml  Net    236 ml    Physical Exam: General: Alert, awake, oriented x3, in no acute distress. HEENT: No bruits, no goiter. Moist mucous membranes, no scleral icterus, no conjunctival pallor. Heart: irregular rhythm, rate controlled, S1/S2 +, no murmurs, rubs, gallops. Lungs: Clear to auscultation bilaterally. No wheezing, no rhonchi, no rales.  Abdomen: Soft, nontender, nondistended, positive bowel sounds. Extremities: No clubbing or cyanosis, no pitting edema,  positive pedal pulses. Neuro: Grossly nonfocal.  Lab Results:  Basic Metabolic Panel:    Component Value Date/Time   NA 131* 05/17/2011 0719   K 3.2* 05/17/2011 0719   CL 95* 05/17/2011 0719   CO2 25 05/17/2011 0719   BUN 13 05/17/2011 0719   CREATININE 0.97 05/17/2011 0719   GLUCOSE 191* 05/17/2011 0719   CALCIUM 8.8 05/17/2011 0719   CBC:    Component Value Date/Time   WBC 8.9 05/17/2011 0719   HGB 12.2 05/17/2011 0719   HCT 36.2 05/17/2011 0719   PLT 311 05/17/2011 0719   MCV 85.0 05/17/2011 0719   NEUTROABS 5.2 05/16/2011 1140   LYMPHSABS 1.3 05/16/2011 1140   MONOABS 0.7 05/16/2011 1140   EOSABS 0.0 05/16/2011 1140   BASOSABS 0.0 05/16/2011 1140      Lab 05/17/11 0719 05/16/11 1140  WBC 8.9 7.2  HGB 12.2 12.6  HCT 36.2 37.1  PLT 311 322  MCV 85.0 85.3    Lab 05/17/11 0719 2011-05-28 1140  NA 131* 131*  K 3.2* 3.7  CL 95* 91*  CO2 25 28  GLUCOSE 191* 213*  BUN 13 13  CREATININE 0.97 1.10  CALCIUM 8.8 9.4    Lab 2011-05-28 1140  INR 1.63*  PROTIME --   Cardiac markers:  Lab May 28, 2011 1140  CKMB --  TROPONINI <0.30  MYOGLOBIN --    Studies/Results: Dg Chest 2 View 05-28-2011    IMPRESSION:  1.  Cardiomegaly with mild pulmonary vascular congestion. 2.  Mild bibasilar atelectasis. 3.  Postoperative changes of the right chest.    Ct Head Wo Contrast 05/28/2011    IMPRESSION:  New cortical hypodensity within the inferior medial temporal lobe of unclear etiology.  Recommend MRI if the patient has symptoms  of acute infarction.    Mr Brain Wo Contrast 28-May-2011  IMPRESSION:  1.  Abnormal signal within the posterior left hippocampus and undersurface of the right temporal lobe.  This raises the possibility of herpes encephalitis.  The differential diagnosis includes a low grade glioma on the left.  A para neoplastic syndrome could have this appearance.  The left-sided appearance could be seen in the setting of recent or ongoing seizure activity. 2.  Mild small vessel disease.   Medications: Scheduled Meds:   . docusate sodium  100 mg Oral BID  . insulin aspart  0-5 Units Subcutaneous QHS  . insulin aspart  0-9 Units Subcutaneous TID WC  . potassium chloride  10 mEq Intravenous Q1 Hr x 4  . senna  1 tablet Oral BID  . warfarin  6 mg Oral ONCE-1800  . Warfarin - Pharmacist Dosing Inpatient   Does not apply q1800   Continuous Infusions:   . sodium chloride 75 mL/hr at 05/17/11 1053   PRN Meds:.acetaminophen, acetaminophen, alum & mag hydroxide-simeth, HYDROmorphone, ondansetron (ZOFRAN) IV, ondansetron, oxyCODONE, zolpidem, DISCONTD: acetaminophen   LOS: 1 day   Murline Weigel 05/17/2011, 6:12 PM  [facility] HOSPITALIST Pager: [phone]

## 2011-05-17 NOTE — Progress Notes (Signed)
ANTIBIOTIC CONSULT NOTE - INITIAL  Pharmacy Consult for acyclovir/vancomycin/zosyn Indication: r/o HSV encephalitis  Allergies  Allergen Reactions  . Procaine Hcl Anaphylaxis    Patient Measurements: Height: 5\' 9"  (175.3 cm) Weight: 220 lb (99.791 kg) IBW/kg (Calculated) : 66.2  Adjusted Body Weight:   Vital Signs: Temp: 100.4 F (38 C) (03/17 1745) Temp src: Axillary (03/17 1745) BP: 173/69 mmHg (03/17 1745) Pulse Rate: 77  (03/17 1745) Intake/Output from previous day: 03/16 0701 - 03/17 0700 In: -  Out: 2 [Urine:2] Intake/Output from this shift: Total I/O In: 240 [P.O.:240] Out: 2 [Urine:2]  Labs:  [facility] 05/17/11 0719 05/16/11 1140  WBC 8.9 7.2  HGB 12.2 12.6  PLT 311 322  LABCREA -- --  CREATININE 0.97 1.10   Estimated Creatinine Clearance: 68.8 ml/min (by C-G formula based on Cr of 0.97). No results found for this basename: VANCOTROUGH:2,VANCOPEAK:2,VANCORANDOM:2,GENTTROUGH:2,GENTPEAK:2,GENTRANDOM:2,TOBRATROUGH:2,TOBRAPEAK:2,TOBRARND:2,AMIKACINPEAK:2,AMIKACINTROU:2,AMIKACIN:2, in the last 72 hours   Microbiology: No results found for this or any previous visit (from the past 720 hour(s)).  Medical History: Past Medical History  Diagnosis Date  . Blindness of left eye   . Eye muscle weakness     Right eye weakness after cataract surgery  . Common migraine     ?hx of  . DIABETES MELLITUS, TYPE II 03/27/2006  . HYPERLIPIDEMIA 03/27/2006  . 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 HYPERTENSION 03/27/2006  . Atrial fibrillation     SVT dx 2007, cath 2007 mild CAD, then had an cardioversion, ablation; still on coumadin , has occ palpitation, EKG 03-2010 NSR  . Headache 05/07/2009  . LUNG NODULE 09/01/2006    Excision, Bx Benighn  . Osteopenia 2004    Dexa 2004 showed Osteopenia, DEXA 03/2007 normal    Medications:  Prescriptions prior to admission  Medication Sig Dispense Refill  . aspirin 325 MG tablet Take 650 mg by mouth daily.       . calcium-vitamin  D (OSCAL WITH D 500-200) 500-200 MG-UNIT per tablet Take 1 tablet by mouth daily.        [name] diltiazem (CARDIZEM) 120 MG tablet Take 1 tablet (120 mg total) by mouth daily.  90 tablet  3  . Glucosamine-Chondroit-Vit C-Mn (GLUCOSAMINE 1500 COMPLEX PO) Take by mouth.      . insulin NPH (HUMULIN N,NOVOLIN N) 100 UNIT/ML injection Inject into the skin. Give 70 units in the morning and 10 units in the afternoon      . irbesartan (AVAPRO) 150 MG tablet Take 1 tablet (150 mg total) by mouth daily.  90 tablet  3  . Multiple Vitamin (MULTIVITAMIN) tablet Take 1 tablet by mouth daily.        . pravastatin (PRAVACHOL) 40 MG tablet TAKE ONE TABLET BY MOUTH EVERY DAY  30 tablet  6  . triamcinolone (KENALOG) 0.025 % ointment Apply topically 3 (three) times daily as needed. For itching  30 g  2  . warfarin (COUMADIN) 5 MG tablet Take 5 mg by mouth as directed. Take 1 tab on Wed and Fri. Take 1/2 tab on Mon, Tues, Thurs, Sat and Sun  Anti-coagulant medication.  Patient needs a consult.       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    Goal of Therapy: Vancomycin trough 15-20 mcg/mL  Plan:  1.  Acyclovir 10 mg/kg/dose (IBW) IV q8h = 660 mg q8h   2.  Vancomycin 1250 mg IV q12h 3.  Zosyn 3.375 G IV x 1 over 30 minutes now, then q8h, given over 4h 4.  F/u cultures, renal function, clinical progress  Lile Mccurley L. Amada Jupiter, PharmD, [place] Clinical Pharmacist Pager: [phone] 05/17/2011 6:25 PM

## 2011-05-17 NOTE — Consult Note (Signed)
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 Leisa Lenz   Problem List:  Principal Problem:  *Headache Active Problems:  Altered mental status  Nausea and vomiting  Fever  DIABETES MELLITUS, TYPE II  HYPERLIPIDEMIA  PERIPHERAL NEUROPATHY  HYPERTENSION  Long term current use of anticoagulant  Atrial fibrillation   Recommendations: 1. Continue observation off of antimicrobial therapy 2. Discontinue droplet precautions   Assessment: [name] [name] initial presentation yesterday was certainly compatible with mild meningoencephalitis but [name] [name] has had a dramatic improvement overnight and now has absolutely no headache and has not had any pain medication today. [name] [name] is alert and fully oriented so I agree with holding off on lumbar puncture at this time and would continue observation off of antimicrobial therapy. If [name] [name] did have meningoencephalitis it appears to be resolving spontaneously.   HPI: [name] [name] is a 71 y.o. female who developed a fever 3 days ago followed by a persistent, severe headache 2 days ago. [name] [name] states that it was the worst headache of [name] [name] life and rates it as an 8/10. When [name] [name] woke yesterday morning the headache was persisting and [name] [name] also had an episode of nausea and vomiting and [name] [name] husband took [name] [name] to the [facility] emergency department where [name] [name] was noted to be somewhat lethargic and confused. [name] [name] underwent a brain MRI scan which showed some enhancement in the left posterior hippocampus and right temporal regions suggestive of herpes simplex encephalitis so [name] [name] was transferred here for admission. [name] [name] nurse noted that [name] [name] was slightly confused on admission here yesterday. [name] [name] states that [name] [name] did not get anything to eat yesterday which made [name] [name] feel very bad and is in a [name] [name] had solid food [name] [name] felt better. [name] [name] has not had any  headache, nausea or vomiting today.   Review of Systems: Pertinent items are noted in HPI.     [name] docusate sodium  100 mg Oral BID  . fentaNYL  25 mcg Intravenous Once  . insulin aspart  0-5 Units Subcutaneous QHS  . insulin aspart  0-9 Units Subcutaneous TID WC  . potassium chloride  10 mEq Intravenous Q1 Hr x 4  . senna  1 tablet Oral BID  . warfarin  6 mg Oral ONCE-1800  . Warfarin - Pharmacist Dosing Inpatient   Does not apply q1800    Past Medical History  Diagnosis Date  . Blindness of left eye   . Eye muscle weakness     Right eye weakness after cataract surgery  . Common migraine     ?hx of  . DIABETES MELLITUS, TYPE II 03/27/2006  . HYPERLIPIDEMIA 03/27/2006  . 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 HYPERTENSION 03/27/2006  . Atrial fibrillation     SVT dx 2007, cath 2007 mild CAD, then had an cardioversion, ablation; still on coumadin , has occ palpitation, EKG 03-2010 NSR  . Headache 05/07/2009  . LUNG NODULE 09/01/2006    Excision, Bx Benighn  . Osteopenia 2004    Dexa 2004 showed Osteopenia, DEXA 03/2007 normal    History  Substance Use Topics  . Smoking status: Former Smoker    Quit date: 03/02/1978  . Smokeless tobacco: Not on file  . Alcohol Use: No    Family History  Problem Relation Age of Onset  . Diabetes Father   . Heart attack Father 42  . Diabetes  Sister   . Cancer Neg Hx     no hx of colon or breast cancer   Allergies  Allergen Reactions  . Procaine Hcl Anaphylaxis    OBJECTIVE: Blood pressure 165/50, pulse 71, temperature 98.1 F (36.7 C), temperature source Oral, resp. rate 19, height 5\' 9"  (1.753 m), weight 99.791 kg (220 lb), SpO2 97.00%. General: Alert and comfortable sitting on the side of the bed Skin: No rash Neck: Supple Lungs: Clear Cor: Regular S1 and S2 with no murmurs Abdomen: Soft and nontender Neuro: [name] [name] is alert and fully oriented  Microbiology: No results found for this or any previous visit (from the past 240  hour(s)).  Michel Bickers, MD [facility] for [facility] [phone] pager   [phone] cell 05/17/2011, 4:32 PM

## 2011-05-18 LAB — CBC
HCT: 37 % (ref 36.0–46.0)
MCHC: 33.5 g/dL (ref 30.0–36.0)
MCV: 85.5 fL (ref 78.0–100.0)
Platelets: 327 10*3/uL (ref 150–400)
RDW: 13.9 % (ref 11.5–15.5)

## 2011-05-18 LAB — BASIC METABOLIC PANEL
BUN: 14 mg/dL (ref 6–23)
Creatinine, Ser: 1.07 mg/dL (ref 0.50–1.10)
GFR calc Af Amer: 60 mL/min — ABNORMAL LOW (ref >90)
GFR calc non Af Amer: 52 mL/min — ABNORMAL LOW (ref >90)
Potassium: 4 mEq/L (ref 3.5–5.1)

## 2011-05-18 LAB — GLUCOSE, CAPILLARY
Glucose-Capillary: 265 mg/dL — ABNORMAL HIGH (ref 70–99)
Glucose-Capillary: 271 mg/dL — ABNORMAL HIGH (ref 70–99)
Glucose-Capillary: 207 mg/dL — ABNORMAL HIGH (ref 70–99)

## 2011-05-18 LAB — PROTIME-INR: INR: 1.47 (ref 0.00–1.49)

## 2011-05-18 MED ORDER — WARFARIN SODIUM 7.5 MG PO TABS
7.5000 mg | ORAL_TABLET | Freq: Once | ORAL | Status: AC
  Administered 2011-05-18: 7.5 mg via ORAL
  Filled 2011-05-18: qty 1

## 2011-05-18 MED ORDER — DILTIAZEM HCL ER COATED BEADS 240 MG PO CP24
240.0000 mg | ORAL_CAPSULE | Freq: Every day | ORAL | Status: DC
  Administered 2011-05-19: 240 mg via ORAL
  Filled 2011-05-18: qty 1

## 2011-05-18 MED ORDER — WARFARIN - PHARMACIST DOSING INPATIENT
Freq: Every day | Status: DC
  Administered 2011-05-18: 18:00:00

## 2011-05-18 MED ORDER — METOPROLOL TARTRATE 12.5 MG HALF TABLET
12.5000 mg | ORAL_TABLET | Freq: Two times a day (BID) | ORAL | Status: DC
  Administered 2011-05-18 – 2011-05-19 (×2): 12.5 mg via ORAL
  Filled 2011-05-18 (×3): qty 1

## 2011-05-18 MED ORDER — PRAVASTATIN SODIUM 40 MG PO TABS
40.0000 mg | ORAL_TABLET | Freq: Every day | ORAL | Status: DC
  Administered 2011-05-18: 40 mg via ORAL
  Filled 2011-05-18 (×2): qty 1

## 2011-05-18 MED ORDER — HEPARIN (PORCINE) IN NACL 100-0.45 UNIT/ML-% IJ SOLN
1300.0000 [IU]/h | INTRAMUSCULAR | Status: DC
  Administered 2011-05-18: 1100 [IU]/h via INTRAVENOUS
  Administered 2011-05-19: 1400 [IU]/h via INTRAVENOUS
  Filled 2011-05-18 (×3): qty 250

## 2011-05-18 NOTE — Progress Notes (Signed)
Inpatient Diabetes Program Recommendations  AACE/ADA: New Consensus Statement on Inpatient Glycemic Control (2009)  Target Ranges:  Prepandial:   less than 140 mg/dL      Peak postprandial:   less than 180 mg/dL (1-2 hours)      Critically ill patients:  140 - 180 mg/dL   Results for [name] [name], [name] [name] (MRN [id number]) as of 05/18/2011 13:26  Ref. Range 05/17/2011 07:51 05/17/2011 12:10 05/17/2011 17:15 05/17/2011 21:46  Glucose-Capillary Latest Range: 70-99 mg/dL 199 (H) 214 (H) 225 (H) 214 (H)    Results for [name] [name], [name] [name] (MRN [id number]) as of 05/18/2011 13:26  Ref. Range 05/18/2011 07:43 05/18/2011 11:22  Glucose-Capillary Latest Range: 70-99 mg/dL 265 (H) 271 (H)    Patient's home insulin regimen per med rec: NPH 70 units in the AM/ 10 units in the PM  Patient currently only getting Sensitive SSI tid ac + HS.   Inpatient Diabetes Program Recommendations Insulin - Basal: Please start at least 1/2 patient's home dose NPH.  NPH 35 units in the morning with breakfast & NPH 5 units at bedtime.  Note: Will follow. Wyn Quaker RN, MSN, CDE Diabetes Coordinator Inpatient Diabetes Program [phone]

## 2011-05-18 NOTE — Progress Notes (Signed)
Utilization review completed.  

## 2011-05-18 NOTE — Evaluation (Signed)
Physical Therapy Evaluation Patient Details Name: [name] [name] MRN: [id number] DOB: [birth year] Today's Date: 05/18/2011  Problem List:  Patient Active Problem List  Diagnoses  . DIABETES MELLITUS, TYPE II  . HYPERLIPIDEMIA  . PERIPHERAL NEUROPATHY  . RETINOPATHY, BACKGROUND NOS  . HYPERTENSION  . LUNG NODULE  . OSTEOPENIA  . Atrial fibrillation  . Encounter for long-term (current) use of anticoagulants  . General medical examination  . Altered mental status  . Fever  . Hypokalemia  . Hyponatremia    Past Medical History:  Past Medical History  Diagnosis Date  . Blindness of left eye   . Eye muscle weakness     Right eye weakness after cataract surgery  . Common migraine     ?hx of  . DIABETES MELLITUS, TYPE II 03/27/2006  . HYPERLIPIDEMIA 03/27/2006  . 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 HYPERTENSION 03/27/2006  . Atrial fibrillation     SVT dx 2007, cath 2007 mild CAD, then had an cardioversion, ablation; still on coumadin , has occ palpitation, EKG 03-2010 NSR  . Headache 05/07/2009  . LUNG NODULE 09/01/2006    Excision, Bx Benighn  . Osteopenia 2004    Dexa 2004 showed Osteopenia, DEXA 03/2007 normal   Past Surgical History: History reviewed. No pertinent past surgical history.  PT Assessment/Plan/Recommendation PT Assessment Clinical Impression Statement: Pt admitted to hospital with questionable viral meningitis vs Herpes simplex vs glioma  referred to PT. Pt greeted sitting EOB and denied  deficits but soon became aware she could not tolerate mobility. Pt had to quickly sit down when eyes were closed testing balance and after  toileting, pt tried to wash hands and had to lean over sink to stabilize d/t lightheadedness. Pt assiisted BTB and unable to do orhtostatics d/t  other discipline arriving, nurse was informed. Pt consistently related dizziness to her liquid diet and declined need for blood sugar check.  Pt will require further skilled PT to assure safe  mobility, investigate cause of light headedness, and minimze fall risk Pt was fully I before without gait device but did not drive d/t vision impairments.  PT Recommendation/Assessment: Patient will need skilled PT in the acute care venue PT Problem List: Decreased activity tolerance;Decreased balance;Decreased mobility Barriers to Discharge: None PT Therapy Diagnosis : Difficulty walking PT Plan PT Frequency: Min 3X/week PT Treatment/Interventions: Gait training;DME instruction;Stair training;Therapeutic activities;Functional mobility training;Therapeutic exercise;Balance training;Patient/family education PT Recommendation Follow Up Recommendations:  (TBD ) Equipment Recommended:  (TBD) PT Goals  Acute Rehab PT Goals PT Goal Formulation: With patient Time For Goal Achievement: 7 days Pt will go Sit to Stand: Independently PT Goal: Sit to Stand - Progress: Goal set today Pt will go Stand to Sit: Independently PT Goal: Stand to Sit - Progress: Goal set today Pt will Stand: 3 - 5 min;Independently PT Goal: Stand - Progress: Goal set today Pt will Ambulate: >150 feet;with modified independence PT Goal: Ambulate - Progress: Goal set today Pt will Go Up / Down Stairs: Flight;with supervision;with rail(s) PT Goal: Up/Down Stairs - Progress: Goal set today  PT Evaluation Precautions/Restrictions  Precautions Precautions: Fall Precaution Comments: lightheadedness when standing Restrictions Weight Bearing Restrictions: No Prior Functioning  Home Living Lives With: Spouse;Family Type of Home: House Home Layout: Multi-level Alternate Level Stairs-Rails: Left;Right Home Access: Stairs to enter Home Adaptive Equipment: None Prior Function Level of Independence: Independent with basic ADLs;Independent with homemaking with ambulation;Independent with gait;Independent with transfers Driving: No Cognition Cognition Arousal/Alertness: Awake/alert Overall Cognitive Status: Appears within  functional limits for tasks assessed Orientation Level: Oriented X4 Sensation/Coordination Sensation Light Touch: Appears Intact Proprioception: Appears Intact Extremity Assessment RLE Assessment RLE Assessment: Within Functional Limits LLE Assessment LLE Assessment: Within Functional Limits Mobility (including Balance) Bed Mobility Bed Mobility: Yes Supine to Sit: 7: Independent Sit to Supine: 7: Independent Transfers Transfers: Yes Sit to Stand: 5: Supervision;With upper extremity assist;From toilet;From bed Stand to Sit: To bed;To toilet;With upper extremity assist;5: Supervision Ambulation/Gait Ambulation/Gait: Yes Ambulation/Gait Assistance: 5: Supervision;4: Min assist Ambulation/Gait Assistance Details (indicate cue type and reason): S gait to BR pushing IV but became lightheaded after toilteting so min A BTB Ambulation Distance (Feet): 10 Feet (x 2) Assistive device: None Gait Pattern: Within Functional Limits Stairs: No  Posture/Postural Control Posture/Postural Control: No significant limitations Balance Balance Assessed: Yes Static Sitting Balance Static Sitting - Level of Assistance: 7: Independent Dynamic Sitting Balance Dynamic Sitting - Level of Assistance: 7: Independent Static Standing Balance Static Standing - Level of Assistance:  (initially mod I but hands on A when lightheaded) Rhomberg - Eyes Closed: 5  (became lightheaded after closing eyes) Dynamic Standing Balance Dynamic Standing - Level of Assistance: 4: Min assist;5: Stand by assistance    End of Session PT - End of Session Equipment Utilized During Treatment: Gait belt Activity Tolerance: Treatment limited secondary to medical complications (Comment) (light headedness) Patient left: in bed Nurse Communication: Other (comment) (pt not tolerating OOB, lightheadedness ) General Behavior During Session: [facility] for tasks performed Cognition: [facility] for tasks performed  Othelia Pulling 05/18/2011, 10:52 AM

## 2011-05-18 NOTE — Progress Notes (Signed)
Subjective: Oriented, pleasant, not confused. Heart rate as high as 130 today.  Objective: Vital signs in last 24 hours: Temp:  [98.4 F (36.9 C)-100.9 F (38.3 C)] 98.6 F (37 C) (03/18 1400) Pulse Rate:  [77-123] 101  (03/18 1400) Resp:  [18-21] 20  (03/18 1400) BP: (148-175)/(63-119) 148/80 mmHg (03/18 1400) SpO2:  [91 %-99 %] 95 % (03/18 1400) Weight:  [96.299 kg (212 lb 4.8 oz)] 96.299 kg (212 lb 4.8 oz) (03/17 2100) Weight change: -3.493 kg (-7 lb 11.2 oz) Last BM Date: 05/15/11  Intake/Output from previous day: 03/17 0701 - 03/18 0700 In: 1320 [P.O.:720; I.V.:600] Out: 7 [Urine:7] Total I/O In: 480 [P.O.:480] Out: -    Physical Exam: General: Alert, awake, oriented x3, in no acute distress. HEENT: No bruits, no goiter. Heart: Tachy, irregular, without murmurs, rubs, gallops. Lungs: Clear to auscultation bilaterally. Abdomen: Soft, nontender, nondistended, positive bowel sounds. Extremities: No clubbing cyanosis or edema with positive pedal pulses. Neuro: Grossly intact, nonfocal.    Lab Results: Basic Metabolic Panel:  Basename 05/18/11 0600 05/17/11 0719  NA 129* 131*  K 4.0 3.2*  CL 90* 95*  CO2 26 25  GLUCOSE 240* 191*  BUN 14 13  CREATININE 1.07 0.97  CALCIUM 8.9 8.8  MG -- --  PHOS -- --   Liver Function Tests:  Basename 05/16/11 1140  AST 22  ALT 10  ALKPHOS 83  BILITOT 0.3  PROT 7.7  ALBUMIN 3.4*   CBC:  Basename 05/18/11 0600 05/17/11 0719 05/16/11 1140  WBC 9.2 8.9 --  NEUTROABS -- -- 5.2  HGB 12.4 12.2 --  HCT 37.0 36.2 --  MCV 85.5 85.0 --  PLT 327 311 --   Cardiac Enzymes:  Basename 05/16/11 1140  CKTOTAL --  CKMB --  CKMBINDEX --  TROPONINI <0.30   CBG:  Basename 05/18/11 1122 05/18/11 0743 05/17/11 2146 05/17/11 1715 05/17/11 1210 05/17/11 0751  GLUCAP 271* 265* 214* 225* 214* 199*   Coagulation:  Basename 05/18/11 0600 05/16/11 1140  LABPROT 18.1* 19.6*  INR 1.47 1.63*    Studies/Results: Dg Chest 2  View  05/16/2011  *RADIOLOGY REPORT*  Clinical Data: Headache and weakness.  Vomiting and fever.  CHEST - 2 VIEW  Comparison: Chest x-ray 12/01/2006.  Findings: The heart is enlarged.  Mild bibasilar airspace disease likely reflects atelectasis.  There is mild pulmonary vascular congestion.  Postsurgical changes are noted in the right chest.  A previous thoracotomy has healed.  IMPRESSION:  1.  Cardiomegaly with mild pulmonary vascular congestion. 2.  Mild bibasilar atelectasis. 3.  Postoperative changes of the right chest.  Original Report Authenticated By: Resa Miner. MATTERN, M.D.   Mr Brain Wo Contrast  05/16/2011  *RADIOLOGY REPORT*  Clinical Data: Nausea and headache.  Altered mental status.  Fever.  MRI HEAD WITHOUT CONTRAST  Technique:  Multiplanar, multiecho pulse sequences of the brain and surrounding structures were obtained according to standard protocol without intravenous contrast.  Comparison: CT head without contrast 05/16/2011.  Findings: A focal area of T2 and FLAIR hyperintensity is present within the posterior medial left temporal lobe, along the body and tail of the hippocampus.  There is slight T2 hyperintensity along the undersurface of the right temporal lobe.  No hemorrhage or mass lesion is present.  There is no restricted diffusion associated with these areas.  Other scattered periventricular and subcortical T2 and FLAIR hyperintensities are present in the frontal parietal lobes bilaterally.  Flow is present in the major intracranial arteries.  The patient is status post bilateral lens extractions.  The globes and orbits are otherwise intact.  The paranasal sinuses and mastoid air cells are clear.  IMPRESSION:  1.  Abnormal signal within the posterior left hippocampus and undersurface of the right temporal lobe.  This raises the possibility of herpes encephalitis.  The differential diagnosis includes a low grade glioma on the left.  A para neoplastic syndrome could have this  appearance.  The left-sided appearance could be seen in the setting of recent or ongoing seizure activity. 2.  Mild small vessel disease.  These results were called by telephone on 05/16/2011  at  05:40 p.m. to  Dr. Lauris Poag, who verbally acknowledged these results.  Original Report Authenticated By: Resa Miner. MATTERN, M.D.   Mr Jeri Cos Contrast  05/18/2011  *RADIOLOGY REPORT*  Clinical Data: 71 year old female with nausea, headache, fever and altered mental status.  Abnormal brain MRI without contrast.  MRI HEAD WITH CONTRAST  Technique:  Multiplanar, multiecho pulse sequences of the brain and surrounding structures were obtained according to standard protocol with intravenous contrast  Contrast: 9mL MULTIHANCE GADOBENATE DIMEGLUMINE 529 MG/ML IV SOLN  Comparison: Brain MRI without contrast 05/16/2011.  Findings: Postcontrast images were obtained to further evaluate the abnormal mesial temporal lobes.  There is asymmetric abnormal enhancement of the left hippocampal complex and para hippocampal gyrus (coronal images 11 - 14).  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   No mass like enhancement on the left.  On the right there is no definite abnormal enhancement. There is subtle decreased T1 signal corresponding to the T2 signal abnormality on the earlier exam.  The superior ophthalmic veins are dilated, but the cavernous sinus appears remain patent.  No other abnormal enhancement.  Basilar cisterns remain patent.  Negative visualized cervical spine. Visualized bone marrow signal is within normal limits.  No ventriculomegaly, midline shift or mass effect.  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  Decreased bitemporal T1 signal corresponding to the abnormality on the earlier noncontrast study.  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 CSF analysis is pending.  A preliminary report without discrepancy  to the above was issued by Dr. Dixon Boos at 2310 hours on 05/17/2011.  Original Report Authenticated By: Randall An, M.D.    Medications: Scheduled Meds:   . aspirin  650 mg Oral Daily  . calcium-vitamin D  1 tablet Oral Daily  . diltiazem  240 mg Oral Daily  . docusate sodium  100 mg Oral BID  . insulin aspart  0-5 Units Subcutaneous QHS  . insulin aspart  0-9 Units Subcutaneous TID WC  . irbesartan  150 mg Oral Daily  . mulitivitamin with minerals  1 tablet Oral Daily  . phytonadione (VITAMIN K) IV  1 mg Intravenous Once  . piperacillin-tazobactam  3.375 g Intravenous Once  . potassium chloride  10 mEq Intravenous Q1 Hr x 4  . pravastatin  40 mg Oral q1800  . senna  1 tablet Oral BID  . triamcinolone   Topical BID  . warfarin  6 mg Oral ONCE-1800  . DISCONTD: acyclovir  10 mg/kg (Ideal) Intravenous Q8H  . DISCONTD: diltiazem  120 mg Oral Daily  . DISCONTD: diltiazem  120 mg Oral Daily  . DISCONTD: multivitamin  1 tablet Oral Daily  . DISCONTD: piperacillin-tazobactam (ZOSYN)  IV  3.375 g Intravenous Q8H  . DISCONTD: simvastatin  20 mg Oral q1800  . DISCONTD: triamcinolone  Topical BID  . DISCONTD: vancomycin  1,250 mg Intravenous Q12H  . DISCONTD: Warfarin - Pharmacist Dosing Inpatient   Does not apply q1800   Continuous Infusions:   . sodium chloride 75 mL/hr at 05/18/11 1522  . heparin 1,100 Units/hr (05/18/11 1003)   PRN Meds:.acetaminophen, acetaminophen, alum & mag hydroxide-simeth, gadobenate dimeglumine, HYDROmorphone, ondansetron (ZOFRAN) IV, ondansetron, oxyCODONE, zolpidem  Assessment/Plan:  Principal Problem:  *Altered mental status Active Problems:  DIABETES MELLITUS, TYPE II  HYPERLIPIDEMIA  PERIPHERAL NEUROPATHY  HYPERTENSION  Atrial fibrillation  Fever  Hypokalemia  Hyponatremia    #1 AMS: resolved. MRI has some temporal lobe findings, but the fact that her "confusion" resolved without treatment makes encephalitis unlikely. ID agrees with  stopping acyclovir and broad-spectrum antibiotics as well as withholding LP at this time.  #2 A Fib with RVR: Will increase her cardizem dose from 120-240mg  to achieve better rate control. Will also add a low dose beta blocker. Restart coumadin. Hopeful for DC in am as long as we can achieve better rate control. For now keep on heparin gtt while INR subtherapeutic given rapid rate, however may not need a heparin bridge if we can get her adequately rate-controlled.  #3 Dispo: Hopeful for DC in 1-2 days.   LOS: 2 days   [facility] Pager: [phone] 05/18/2011, 4:26 PM

## 2011-05-18 NOTE — Progress Notes (Signed)
Infectious Disease Follow Up Note:  S: complains of not getting enough food, some light headedness with ambulation.  No headache, no photophobia.    O: Filed Vitals:   05/18/11 1100  BP: 154/83  Pulse: 101  Temp: 98.4 F (36.9 C)  Resp: 20   Gen - lying in bed, alert, responds to questions.  NAD.   Neuro - moving all extremities, AAO x 3 CV - RRR without m/r/g Lungs - CTA B Abd - soft, ntnd, +bs Ext - no edema  Antibiotics - empirically received vancomycin, zosyn and acyclovir  No micro  MRI - temporal lobe enhancement  A/P - 71 yo with headache and fever.  By my exam, she is not confused, low grade fever, no meningial signs.  If there was confusion in the ED, it has resolved without treatment which makes HSV encephalitis very unlikely.  No signs at all at this time concerning for encephalitis.  Despite MRI findings, her clinical picture is not concerning and therefore treatment is not indicated.  All antibiotics have been stopped.  No indication for an LP at this time.  No need for acyclovir or Valacyclovir.

## 2011-05-18 NOTE — Progress Notes (Signed)
Physical Therapy Treatment Patient Details Name: [name] [name] MRN: [id number] DOB: 02-Apr-1940 Today's Date: 05/18/2011  PT returned to assess orthostatics to see if this was related to pt light headedness this morning, pt still adamant it is because she is not allowed to eat solid food. BP supine 144/67 HR 63, seated 125/95 HR 79, standing (pt unable to tolerate standing until full BP read) 113/87, HR 67- pt refusing to keep eyes open standing stating it was making it worse, dizziness will continue to be monitored in PT, nurse informed of above. Pt left in supine resting with MD present. PT Assessment/Plan  PT - Assessment/Plan PT Frequency: Min 3X/week Follow Up Recommendations:  (TBD ) Equipment Recommended:  (TBD) PT Goals  Acute Rehab PT Goals PT Goal Formulation: With patient Time For Goal Achievement: 7 days Pt will go Sit to Stand: Independently PT Goal: Sit to Stand - Progress: Goal set today Pt will go Stand to Sit: Independently PT Goal: Stand to Sit - Progress: Goal set today Pt will Stand: 3 - 5 min;Independently PT Goal: Stand - Progress: Goal set today Pt will Ambulate: >150 feet;with modified independence PT Goal: Ambulate - Progress: Goal set today Pt will Go Up / Down Stairs: Flight;with supervision;with rail(s) PT Goal: Up/Down Stairs - Progress: Goal set today  PT Treatment Precautions/Restrictions  Precautions Precautions: Fall Precaution Comments: lightheadedness when standing Restrictions Weight Bearing Restrictions: No Mobility (including Balance) Bed Mobility Bed Mobility: Yes Supine to Sit: 7: Independent Sit to Supine: 7: Independent Transfers Transfers: Yes Sit to Stand: 5: Supervision;With upper extremity assist;From toilet;From bed Stand to Sit: To bed;To toilet;With upper extremity assist;5: Supervision Ambulation/Gait Ambulation/Gait: Yes Ambulation/Gait Assistance: 5: Supervision;4: Min assist Ambulation/Gait Assistance Details  (indicate cue type and reason): S gait to BR pushing IV but became lightheaded after toilteting so min A BTB Ambulation Distance (Feet): 10 Feet (x 2) Assistive device: None Gait Pattern: Within Functional Limits Stairs: No  Posture/Postural Control Posture/Postural Control: No significant limitations Balance Balance Assessed: Yes Static Sitting Balance Static Sitting - Level of Assistance: 7: Independent Dynamic Sitting Balance Dynamic Sitting - Level of Assistance: 7: Independent Static Standing Balance Static Standing - Level of Assistance:  (initially mod I but hands on A when lightheaded) Rhomberg - Eyes Closed: 5  (became lightheaded after closing eyes) Dynamic Standing Balance Dynamic Standing - Level of Assistance: 4: Min assist;5: Stand by assistance Exercise    End of Session PT - End of Session Equipment Utilized During Treatment: Gait belt Activity Tolerance: Treatment limited secondary to medical complications (Comment) (light headedness) Patient left: in bed Nurse Communication: Other (comment) (pt not tolerating OOB, lightheadedness ) General Behavior During Session: [facility] for tasks performed Cognition: [facility] for tasks performed  Othelia Pulling 05/18/2011, 12:03 PM

## 2011-05-18 NOTE — Progress Notes (Signed)
ANTICOAGULATION CONSULT NOTE - Initial Consult  Pharmacy Consult for heparin Indication: atrial fibrillation  Allergies  Allergen Reactions  . Procaine Hcl Anaphylaxis    Patient Measurements: Height: 5\' 9"  (175.3 cm) Weight: 212 lb 4.8 oz (96.299 kg) IBW/kg (Calculated) : 66.2   Vital Signs: Temp: 99.2 F (37.3 C) (03/18 0549) Temp src: Oral (03/18 0549) BP: 150/119 mmHg (03/18 0549) Pulse Rate: 118  (03/18 0549)  Labs:  Basename 05/18/11 0600 05/17/11 0719 05/16/11 1140  HGB 12.4 12.2 --  HCT 37.0 36.2 37.1  PLT 327 311 322  APTT -- -- 39*  LABPROT 18.1* -- 19.6*  INR 1.47 -- 1.63*  HEPARINUNFRC -- -- --  CREATININE 1.07 0.97 1.10  CKTOTAL -- -- --  CKMB -- -- --  TROPONINI -- -- <0.30   Estimated Creatinine Clearance: 61.3 ml/min (by C-G formula based on Cr of 1.07).  Medical History: Past Medical History  Diagnosis Date  . Blindness of left eye   . Eye muscle weakness     Right eye weakness after cataract surgery  . Common migraine     ?hx of  . DIABETES MELLITUS, TYPE II 03/27/2006  . HYPERLIPIDEMIA 03/27/2006  . 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 HYPERTENSION 03/27/2006  . Atrial fibrillation     SVT dx 2007, cath 2007 mild CAD, then had an cardioversion, ablation; still on coumadin , has occ palpitation, EKG 03-2010 NSR  . Headache 05/07/2009  . LUNG NODULE 09/01/2006    Excision, Bx Benighn  . Osteopenia 2004    Dexa 2004 showed Osteopenia, DEXA 03/2007 normal    Medications:  Scheduled:    . acyclovir  10 mg/kg (Ideal) Intravenous Q8H  . aspirin  650 mg Oral Daily  . calcium-vitamin D  1 tablet Oral Daily  . diltiazem  120 mg Oral Daily  . docusate sodium  100 mg Oral BID  . insulin aspart  0-5 Units Subcutaneous QHS  . insulin aspart  0-9 Units Subcutaneous TID WC  . irbesartan  150 mg Oral Daily  . mulitivitamin with minerals  1 tablet Oral Daily  . phytonadione (VITAMIN K) IV  1 mg Intravenous Once  . piperacillin-tazobactam  3.375 g  Intravenous Once  . piperacillin-tazobactam (ZOSYN)  IV  3.375 g Intravenous Q8H  . potassium chloride  10 mEq Intravenous Q1 Hr x 4  . senna  1 tablet Oral BID  . simvastatin  20 mg Oral q1800  . triamcinolone   Topical BID  . vancomycin  1,250 mg Intravenous Q12H  . warfarin  6 mg Oral ONCE-1800  . DISCONTD: diltiazem  120 mg Oral Daily  . DISCONTD: multivitamin  1 tablet Oral Daily  . DISCONTD: triamcinolone   Topical BID  . DISCONTD: Warfarin - Pharmacist Dosing Inpatient   Does not apply q1800    Assessment: 71yo female now with INR<1.5 with Coumadin on hold and s/p vit K reversal to begin heparin for Afib.  Goal of Therapy:  Heparin level 0.3-0.7 units/ml   Plan:  Will begin heparin gtt at 1100 units/hr and monitor heparin levels and CBC.  Rogue Bussing PharmD BCPS 05/18/2011,7:53 AM

## 2011-05-18 NOTE — Progress Notes (Signed)
Spoke with Dr.Hernandez about pt running Afib-Flutter, and it looks like when her HR goes up it looks like Afib. HR in 131 MD aware no new orders, will continue to monitor.

## 2011-05-18 NOTE — Progress Notes (Signed)
ANTICOAGULATION CONSULT NOTE - Follow Up Consult  Pharmacy Consult for Heparin Indication: atrial fibrillation  Allergies  Allergen Reactions  . Procaine Hcl Anaphylaxis    Patient Measurements: Height: 5\' 9"  (175.3 cm) Weight: 212 lb 4.8 oz (96.299 kg) IBW/kg (Calculated) : 66.2 kg  Vital Signs: Temp: 98.6 F (37 C) (03/18 1400) Temp src: Oral (03/18 1400) BP: 148/80 mmHg (03/18 1400) Pulse Rate: 101  (03/18 1400)  Labs:  Basename 05/18/11 1552 05/18/11 0600 05/17/11 0719 05/16/11 1140  HGB -- 12.4 12.2 --  HCT -- 37.0 36.2 37.1  PLT -- 327 311 322  APTT -- -- -- 39*  LABPROT -- 18.1* -- 19.6*  INR -- 1.47 -- 1.63*  HEPARINUNFRC 0.10* -- -- --  CREATININE -- 1.07 0.97 1.10  CKTOTAL -- -- -- --  CKMB -- -- -- --  TROPONINI -- -- -- <0.30   Estimated Creatinine Clearance: 61.3 ml/min (by C-G formula based on Cr of 1.07).   Medications:  Scheduled:    . aspirin  650 mg Oral Daily  . calcium-vitamin D  1 tablet Oral Daily  . diltiazem  240 mg Oral Daily  . docusate sodium  100 mg Oral BID  . insulin aspart  0-5 Units Subcutaneous QHS  . insulin aspart  0-9 Units Subcutaneous TID WC  . irbesartan  150 mg Oral Daily  . metoprolol tartrate  12.5 mg Oral BID  . mulitivitamin with minerals  1 tablet Oral Daily  . phytonadione (VITAMIN K) IV  1 mg Intravenous Once  . piperacillin-tazobactam  3.375 g Intravenous Once  . potassium chloride  10 mEq Intravenous Q1 Hr x 4  . pravastatin  40 mg Oral q1800  . senna  1 tablet Oral BID  . triamcinolone   Topical BID  . warfarin  6 mg Oral ONCE-1800  . warfarin  7.5 mg Oral ONCE-1800  . Warfarin - Pharmacist Dosing Inpatient   Does not apply q1800  . DISCONTD: acyclovir  10 mg/kg (Ideal) Intravenous Q8H  . DISCONTD: diltiazem  120 mg Oral Daily  . DISCONTD: diltiazem  120 mg Oral Daily  . DISCONTD: multivitamin  1 tablet Oral Daily  . DISCONTD: piperacillin-tazobactam (ZOSYN)  IV  3.375 g Intravenous Q8H  . DISCONTD:  simvastatin  20 mg Oral q1800  . DISCONTD: triamcinolone   Topical BID  . DISCONTD: vancomycin  1,250 mg Intravenous Q12H  . DISCONTD: Warfarin - Pharmacist Dosing Inpatient   Does not apply q1800    Assessment: 71 year old on heparin for Afib, Coumadin restarted this PM as LP cancelled, Heparin level = 0.10  Goal of Therapy:  Heparin level 0.3-0.7 units/ml   Plan:  1) Increase heparin to 1400 units / hr 2) Heparin level 8 hours after heparin increased  Thank  You.  Tad Moore 05/18/2011,5:15 PM

## 2011-05-18 NOTE — Progress Notes (Addendum)
ANTICOAGULATION CONSULT NOTE - Initial Consult  Pharmacy Consult for Coumadin Indication: atrial fibrillation  Allergies  Allergen Reactions  . Procaine Hcl Anaphylaxis    Patient Measurements: Height: 5\' 9"  (175.3 cm) Weight: 212 lb 4.8 oz (96.299 kg) IBW/kg (Calculated) : 66.2 kg  Vital Signs: Temp: 98.6 F (37 C) (03/18 1400) Temp src: Oral (03/18 1400) BP: 148/80 mmHg (03/18 1400) Pulse Rate: 101  (03/18 1400)  Labs:  Basename 05/18/11 0600 05/17/11 0719 05/16/11 1140  HGB 12.4 12.2 --  HCT 37.0 36.2 37.1  PLT 327 311 322  APTT -- -- 39*  LABPROT 18.1* -- 19.6*  INR 1.47 -- 1.63*  HEPARINUNFRC -- -- --  CREATININE 1.07 0.97 1.10  CKTOTAL -- -- --  CKMB -- -- --  TROPONINI -- -- <0.30   Estimated Creatinine Clearance: 61.3 ml/min (by C-G formula based on Cr of 1.07).  Medical History: Past Medical History  Diagnosis Date  . Blindness of left eye   . Eye muscle weakness     Right eye weakness after cataract surgery  . Common migraine     ?hx of  . DIABETES MELLITUS, TYPE II 03/27/2006  . HYPERLIPIDEMIA 03/27/2006  . 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 HYPERTENSION 03/27/2006  . Atrial fibrillation     SVT dx 2007, cath 2007 mild CAD, then had an cardioversion, ablation; still on coumadin , has occ palpitation, EKG 03-2010 NSR  . Headache 05/07/2009  . LUNG NODULE 09/01/2006    Excision, Bx Benighn  . Osteopenia 2004    Dexa 2004 showed Osteopenia, DEXA 03/2007 normal    Medications:  Scheduled:    . aspirin  650 mg Oral Daily  . calcium-vitamin D  1 tablet Oral Daily  . diltiazem  240 mg Oral Daily  . docusate sodium  100 mg Oral BID  . insulin aspart  0-5 Units Subcutaneous QHS  . insulin aspart  0-9 Units Subcutaneous TID WC  . irbesartan  150 mg Oral Daily  . mulitivitamin with minerals  1 tablet Oral Daily  . phytonadione (VITAMIN K) IV  1 mg Intravenous Once  . piperacillin-tazobactam  3.375 g Intravenous Once  . potassium chloride  10 mEq  Intravenous Q1 Hr x 4  . pravastatin  40 mg Oral q1800  . senna  1 tablet Oral BID  . triamcinolone   Topical BID  . warfarin  6 mg Oral ONCE-1800  . DISCONTD: acyclovir  10 mg/kg (Ideal) Intravenous Q8H  . DISCONTD: diltiazem  120 mg Oral Daily  . DISCONTD: diltiazem  120 mg Oral Daily  . DISCONTD: multivitamin  1 tablet Oral Daily  . DISCONTD: piperacillin-tazobactam (ZOSYN)  IV  3.375 g Intravenous Q8H  . DISCONTD: simvastatin  20 mg Oral q1800  . DISCONTD: triamcinolone   Topical BID  . DISCONTD: vancomycin  1,250 mg Intravenous Q12H  . DISCONTD: Warfarin - Pharmacist Dosing Inpatient   Does not apply q1800    Assessment: 71 year old on Coumadin PTA for Afib.  INR = 1.47 at admission, currently on heparin  Dose PTA = 5 mg Mon, Fri, 2.5 mg other days  Goal of Therapy:  INR 2-3   Plan:  1) Coumadin 7.5 mg po x 1 dose today 2) Daily INR 3) Continue heparin  Thank you.  Tad Moore 05/18/2011,4:28 PM

## 2011-05-19 LAB — GLUCOSE, CAPILLARY: Glucose-Capillary: 246 mg/dL — ABNORMAL HIGH (ref 70–99)

## 2011-05-19 LAB — CBC
MCH: 28.7 pg (ref 26.0–34.0)
MCHC: 33.8 g/dL (ref 30.0–36.0)
MCV: 84.9 fL (ref 78.0–100.0)
Platelets: 298 10*3/uL (ref 150–400)
RDW: 14 % (ref 11.5–15.5)

## 2011-05-19 MED ORDER — DILTIAZEM HCL ER COATED BEADS 240 MG PO CP24: 240.0000 mg | ORAL_CAPSULE | Freq: Every day | ORAL | Status: DC

## 2011-05-19 MED ORDER — METOPROLOL TARTRATE 12.5 MG HALF TABLET: 12.5000 mg | ORAL_TABLET | Freq: Two times a day (BID) | ORAL | Status: DC

## 2011-05-19 NOTE — Progress Notes (Signed)
Pt being d/c/d. Belongings with pt. Family at bedside. Iv d/c'd. D/c instructions given to pt.

## 2011-05-19 NOTE — Progress Notes (Signed)
ANTICOAGULATION CONSULT NOTE - Follow Up Consult  Pharmacy Consult for heparin Indication: atrial fibrillation  Labs:  Basename 05/19/11 0520 05/18/11 1552 05/18/11 0600 05/17/11 0719 05/16/11 1140  HGB 11.4* -- 12.4 -- --  HCT 33.7* -- 37.0 36.2 --  PLT 298 -- 327 311 --  APTT -- -- -- -- 39*  LABPROT 17.5* -- 18.1* -- 19.6*  INR 1.41 -- 1.47 -- 1.63*  HEPARINUNFRC 0.73* 0.10* -- -- --  CREATININE -- -- 1.07 0.97 1.10  CKTOTAL -- -- -- -- --  CKMB -- -- -- -- --  TROPONINI -- -- -- -- <0.30    Assessment: 71yo female slightly supratherapeutic on heparin after rate increase for Afib.  Goal of Therapy:  Heparin level 0.3-0.7 units/ml   Plan:  Will decrease heparin gtt by 1 unit/kg/hr to 1300 units/hr and check level in ~6hr.  Rogue Bussing PharmD BCPS 05/19/2011,7:00 AM

## 2011-05-19 NOTE — Progress Notes (Signed)
Late entry: 05/19/11 @ 00:45; notified Forrest Moron, NP on call for [facility] via text to see if patient needed to continue telemetry monitoring. I had received in report from day RN that pt went into Afib with ST and MD was notified. Patients Cardizem increased starting 3/19. I did not see a note regarding this. Patients telemetry order is to d/c after 48 hours if not continued. Will leave patient on telemetry and have first shift RN address with MD. Dudley Major RN

## 2011-05-19 NOTE — Progress Notes (Signed)
   CARE MANAGEMENT NOTE 05/19/2011  Patient:  [name] [name], [name] [name]   Account Number:  [id number]  Date Initiated:  05/19/2011  Documentation initiated by:  Tomi Bamberger  Subjective/Objective Assessment:   dx ams, afib with rvr  admit- lives with spouse.     Action/Plan:   Anticipated DC Date:  05/19/2011   Anticipated DC Plan:  HOME/SELF CARE      DC Planning Services  CM consult      Choice offered to / List presented to:             Status of service:  Completed, signed off Medicare Important Message given?   (If response is "NO", the following Medicare IM given date fields will be blank) Date Medicare IM given:   Date Additional Medicare IM given:    Discharge Disposition:  HOME/SELF CARE  Per UR Regulation:    If discussed at Long Length of Stay Meetings, dates discussed:    Comments:  05/19/11 16:14 Tomi Bamberger RN, BSN [phone] patient lives with spouse, pt for dc today, no needs identified.

## 2011-05-19 NOTE — Progress Notes (Addendum)
Physical Therapy Treatment Patient Details Name: [name] [name] MRN: [id number] DOB: [birth year] Today's Date: 05/19/2011  PT Assessment/Plan  PT - Assessment/Plan Comments on Treatment Session: PT feeling much better today, asymptomatic with mild orthostatic changes. (150/78 sitting>125/65 standing), no dizziness. Educated pt on moving LEs prior to standing and waiting a few seconds before walking for safety. Pt able to ambulate 2x120' with S only and safe demo noted for transfers. Will continue to follow pt acutely for increased activity tolerance, but no f/u PT needed. PT Plan: Discharge plan needs to be updated  PT Frequency: Min 3X/week Follow Up Recommendations: No PT follow up;Supervision/Assistance - 24 hour Equipment Recommended: None recommended by PT PT Goals  Acute Rehab PT Goals PT Goal Formulation: With patient PT Goal: Sit to Stand - Progress: Progressing toward goal PT Goal: Stand to Sit - Progress: Progressing toward goal PT Goal: Stand - Progress: Progressing toward goal PT Goal: Ambulate - Progress: Progressing toward goal PT Goal: Up/Down Stairs - Progress:  (Pt can live on 1st floor )  PT Treatment Precautions/Restrictions  Precautions Precautions: Fall Precaution Comments: lightheadedness when standing Restrictions Weight Bearing Restrictions: No Mobility (including Balance) Bed Mobility Bed Mobility: No Transfers Transfers: Yes Sit to Stand: 5: Supervision;With upper extremity assist;From chair/3-in-1;With armrests Sit to Stand Details (indicate cue type and reason): S for safety due to prior dizziness, pt with no sx at this time.  Stand to Sit: 5: Supervision;With upper extremity assist;To chair/3-in-1;With armrests Stand to Sit Details: Safe hand placement noted, S for safety Ambulation/Gait Ambulation/Gait: Yes Ambulation/Gait Assistance: 5: Supervision Ambulation/Gait Assistance Details (indicate cue type and reason): Min-guard>S with initially  guarded gait, but quickly relaxed. Mild increased lateral sway noted.  Ambulation Distance (Feet): 120 Feet (x2), rest break between Assistive device: None Gait Pattern: Within Functional Limits Stairs: No  Posture/Postural Control Posture/Postural Control: No significant limitations Balance Balance Assessed: Yes Static Sitting Balance Static Sitting - Level of Assistance: 7: Independent Dynamic Sitting Balance Dynamic Sitting - Level of Assistance: 7: Independent Static Standing Balance Static Standing - Balance Support: No upper extremity supported Static Standing - Level of Assistance: 5: Stand by assistance Static Standing - Comment/# of Minutes: 9min during functional activity Dynamic Standing Balance Dynamic Standing - Level of Assistance: 5: Stand by assistance Exercise    End of Session PT - End of Session Equipment Utilized During Treatment: Gait belt Activity Tolerance: Patient tolerated treatment well Patient left: in chair;with call bell in reach Nurse Communication: Mobility status for ambulation General Behavior During Session: [facility] for tasks performed Cognition: [facility] for tasks performed  Soundra Pilon, [place]  05/19/2011, 12:55 PM

## 2011-05-19 NOTE — Discharge Summary (Signed)
Physician Discharge Summary  Patient ID: [name] [name] MRN: [id number] DOB/AGE: [birth year] 71 y.o.  Admit date: 05/16/2011 Discharge date: 05/19/2011  Primary Care Physician:  [name], MD, MD   Discharge Diagnoses:    Principal Problem:  *Altered mental status Active Problems:  DIABETES MELLITUS, TYPE II  HYPERLIPIDEMIA  PERIPHERAL NEUROPATHY  HYPERTENSION  Atrial fibrillation  Fever  Hypokalemia  Hyponatremia    Medication List  As of 05/19/2011  3:29 PM   STOP taking these medications         diltiazem 120 MG tablet      GLUCOSAMINE 1500 COMPLEX PO         TAKE these medications         aspirin 325 MG tablet   Take 650 mg by mouth daily.      calcium-vitamin D 500-200 MG-UNIT per tablet   Commonly known as: OSCAL WITH D   Take 1 tablet by mouth daily.      diltiazem 240 MG 24 hr capsule   Commonly known as: CARDIZEM CD   Take 1 capsule (240 mg total) by mouth daily.      insulin NPH 100 UNIT/ML injection   Commonly known as: HUMULIN N,NOVOLIN N   Inject into the skin. Give 70 units in the morning and 10 units in the afternoon      irbesartan 150 MG tablet   Commonly known as: AVAPRO   Take 1 tablet (150 mg total) by mouth daily.      metoprolol tartrate 12.5 mg Tabs   Commonly known as: LOPRESSOR   Take 0.5 tablets (12.5 mg total) by mouth 2 (two) times daily.      multivitamin tablet   Take 1 tablet by mouth daily.      pravastatin 40 MG tablet   Commonly known as: PRAVACHOL   TAKE ONE TABLET BY MOUTH EVERY DAY      triamcinolone 0.025 % ointment   Commonly known as: KENALOG   Apply topically 3 (three) times daily as needed. For itching      warfarin 5 MG tablet   Commonly known as: COUMADIN   Take 5 mg by mouth as directed. Take 1 tab on Wed and Fri. Take 1/2 tab on Mon, Tues, Thurs, Sat and Sun    Anti-coagulant medication.  Patient needs a consult.             Disposition and Follow-up:  Will be discharged home today in  stable and improved condition. Should followup with her PCP in 2-3 weeks for followup of BP and HR on newly started BB and increased dose of cardizem.  Consults:  ID Dr. Linus Salmons   Significant Diagnostic Studies:  Dg Chest 2 View  05/16/2011  *RADIOLOGY REPORT*  Clinical Data: Headache and weakness.  Vomiting and fever.  CHEST - 2 VIEW  Comparison: Chest x-ray 12/01/2006.  Findings: The heart is enlarged.  Mild bibasilar airspace disease likely reflects atelectasis.  There is mild pulmonary vascular congestion.  Postsurgical changes are noted in the right chest.  A previous thoracotomy has healed.  IMPRESSION:  1.  Cardiomegaly with mild pulmonary vascular congestion. 2.  Mild bibasilar atelectasis. 3.  Postoperative changes of the right chest.  Original Report Authenticated By: Resa Miner. MATTERN, M.D.   Ct Head Wo Contrast  05/16/2011  *RADIOLOGY REPORT*  Clinical Data: Nausea, vomiting  CT HEAD WITHOUT CONTRAST  Technique:  Contiguous axial images were obtained from the base of the skull through the vertex  without contrast.  Comparison: CT 05/05/2010  Findings: No acute intracranial hemorrhage.  No focal mass lesion. There is a new cortical hypodensity within the inferior medial left temporal lobe (image 10).  No additional evidence infarction.  No evidence of intracranial hemorrhage.  Paranasal sinuses and mastoid air cells are clear.  Orbits are normal.  IMPRESSION:  New cortical hypodensity within the inferior medial temporal lobe of unclear etiology.  Recommend MRI if the patient has symptoms of acute infarction.  Original Report Authenticated By: Suzy Bouchard, M.D.   Mr Brain Wo Contrast  05/16/2011  *RADIOLOGY REPORT*  Clinical Data: Nausea and headache.  Altered mental status.  Fever.  MRI HEAD WITHOUT CONTRAST  Technique:  Multiplanar, multiecho pulse sequences of the brain and surrounding structures were obtained according to standard protocol without intravenous contrast.  Comparison:  CT head without contrast 05/16/2011.  Findings: A focal area of T2 and FLAIR hyperintensity is present within the posterior medial left temporal lobe, along the body and tail of the hippocampus.  There is slight T2 hyperintensity along the undersurface of the right temporal lobe.  No hemorrhage or mass lesion is present.  There is no restricted diffusion associated with these areas.  Other scattered periventricular and subcortical T2 and FLAIR hyperintensities are present in the frontal parietal lobes bilaterally.  Flow is present in the major intracranial arteries.  The patient is status post bilateral lens extractions.  The globes and orbits are otherwise intact.  The paranasal sinuses and mastoid air cells are clear.  IMPRESSION:  1.  Abnormal signal within the posterior left hippocampus and undersurface of the right temporal lobe.  This raises the possibility of herpes encephalitis.  The differential diagnosis includes a low grade glioma on the left.  A para neoplastic syndrome could have this appearance.  The left-sided appearance could be seen in the setting of recent or ongoing seizure activity. 2.  Mild small vessel disease.  These results were called by telephone on 05/16/2011  at  05:40 p.m. to  Dr. Lauris Poag, who verbally acknowledged these results.  Original Report Authenticated By: Resa Miner. MATTERN, M.D.    Brief H and P: For complete details please refer to admission H and P, but in brief patient is a  71 y.o. female seen at the [facility] ED and transferred to [facility] for persstent headache And episodes of nausea and vomiing X 3 days patient denies fevers but reports chills. In the ED a CT scan of the brain and MRI study was performed and revealed a area of concern (enhancement in the left temoral lobe). Patient was placed in droplet isolation for possible viral meningitis, and Infectious Dieases was contacted at that time. We were asked to admit him for further evaluation and  management.     Hospital Course:  Principal Problem:  *Altered mental status Active Problems:  DIABETES MELLITUS, TYPE II  HYPERLIPIDEMIA  PERIPHERAL NEUROPATHY  HYPERTENSION  Atrial fibrillation  Fever  Hypokalemia  Hyponatremia   #1 [id number]. MRI has some temporal lobe findings, but the fact that her "confusion" resolved without treatment makes encephalitis unlikely. ID agrees with stopping acyclovir and broad-spectrum antibiotics as well as withholding LP at this time. She is completely oriented and not confused.  #2 A Fib with RVR: HR now controlled on increased cardizem dose and addition of metoprolol.  Rest of chronic medical issues are stable at this time.   Time spent on Discharge: Greater than 30 minutes.  Signed: Lelon Frohlich [facility] Pager:  [phone] 05/19/2011, 3:29 PM

## 2011-05-19 NOTE — Progress Notes (Addendum)
Inpatient Diabetes Program Recommendations  AACE/ADA: New Consensus Statement on Inpatient Glycemic Control (2009)  Target Ranges:  Prepandial:   less than 140 mg/dL      Peak postprandial:   less than 180 mg/dL (1-2 hours)      Critically ill patients:  140 - 180 mg/dL    Results for [name] [name], [name] [name] (MRN [id number]) as of 05/19/2011 08:21  Ref. Range 05/18/2011 07:43 05/18/2011 11:22 05/18/2011 16:24 05/18/2011 21:05  Glucose-Capillary Latest Range: 70-99 mg/dL 265 (H) 271 (H) 305 (H) 207 (H)    Inpatient Diabetes Program Recommendations Insulin - Basal: Please start at least 1/2 patient's home dose NPH.  NPH 35 units in the morning with breakfast & NPH 5 units at bedtime.  Note: Patient's home dose NPH:  70 units AM & 10 units PM  Will follow. Wyn Quaker RN, MSN, CDE Diabetes Coordinator Inpatient Diabetes Program [phone]

## 2011-05-20 ENCOUNTER — Telehealth: Payer: Self-pay | Admitting: *Deleted

## 2011-05-20 LAB — GLUCOSE, CAPILLARY: Glucose-Capillary: 277 mg/dL — ABNORMAL HIGH (ref 70–99)

## 2011-05-20 LAB — URINE CULTURE

## 2011-05-20 MED ORDER — CEFPROZIL 500 MG PO TABS: 500.0000 mg | ORAL_TABLET | Freq: Two times a day (BID) | ORAL | Status: DC

## 2011-05-20 NOTE — Telephone Encounter (Signed)
Refill done.  

## 2011-05-22 ENCOUNTER — Encounter (HOSPITAL_COMMUNITY): Payer: Self-pay

## 2011-05-22 ENCOUNTER — Inpatient Hospital Stay (HOSPITAL_COMMUNITY)
Admission: EM | Admit: 2011-05-22 | Discharge: 2011-06-02 | DRG: 097 | Disposition: A | Discharge status: Skilled Nursing Facility | Payer: Medicare Other | Attending: Internal Medicine | Admitting: Internal Medicine

## 2011-05-22 DIAGNOSIS — G609 Hereditary and idiopathic neuropathy, unspecified: Secondary | ICD-10-CM | POA: Diagnosis present

## 2011-05-22 DIAGNOSIS — G929 Unspecified toxic encephalopathy: Secondary | ICD-10-CM | POA: Diagnosis present

## 2011-05-22 DIAGNOSIS — I4891 Unspecified atrial fibrillation: Secondary | ICD-10-CM | POA: Diagnosis present

## 2011-05-22 DIAGNOSIS — G92 Toxic encephalopathy: Secondary | ICD-10-CM

## 2011-05-22 DIAGNOSIS — M899 Disorder of bone, unspecified: Secondary | ICD-10-CM

## 2011-05-22 DIAGNOSIS — B9620 Unspecified Escherichia coli [E. coli] as the cause of diseases classified elsewhere: Secondary | ICD-10-CM

## 2011-05-22 DIAGNOSIS — A498 Other bacterial infections of unspecified site: Secondary | ICD-10-CM | POA: Diagnosis present

## 2011-05-22 DIAGNOSIS — G40909 Epilepsy, unspecified, not intractable, without status epilepticus: Secondary | ICD-10-CM | POA: Diagnosis present

## 2011-05-22 DIAGNOSIS — B3731 Acute candidiasis of vulva and vagina: Secondary | ICD-10-CM | POA: Diagnosis present

## 2011-05-22 DIAGNOSIS — R6520 Severe sepsis without septic shock: Secondary | ICD-10-CM | POA: Diagnosis not present

## 2011-05-22 DIAGNOSIS — J984 Other disorders of lung: Secondary | ICD-10-CM

## 2011-05-22 DIAGNOSIS — N390 Urinary tract infection, site not specified: Secondary | ICD-10-CM | POA: Diagnosis present

## 2011-05-22 DIAGNOSIS — H3500 Unspecified background retinopathy: Secondary | ICD-10-CM

## 2011-05-22 DIAGNOSIS — E876 Hypokalemia: Secondary | ICD-10-CM | POA: Diagnosis present

## 2011-05-22 DIAGNOSIS — B1009 Other human herpesvirus encephalitis: Principal | ICD-10-CM | POA: Diagnosis present

## 2011-05-22 DIAGNOSIS — R569 Unspecified convulsions: Secondary | ICD-10-CM

## 2011-05-22 DIAGNOSIS — I10 Essential (primary) hypertension: Secondary | ICD-10-CM | POA: Diagnosis present

## 2011-05-22 DIAGNOSIS — Z7901 Long term (current) use of anticoagulants: Secondary | ICD-10-CM

## 2011-05-22 DIAGNOSIS — J9600 Acute respiratory failure, unspecified whether with hypoxia or hypercapnia: Secondary | ICD-10-CM | POA: Diagnosis present

## 2011-05-22 DIAGNOSIS — B373 Candidiasis of vulva and vagina: Secondary | ICD-10-CM | POA: Diagnosis present

## 2011-05-22 DIAGNOSIS — J9601 Acute respiratory failure with hypoxia: Secondary | ICD-10-CM | POA: Diagnosis not present

## 2011-05-22 DIAGNOSIS — R509 Fever, unspecified: Secondary | ICD-10-CM

## 2011-05-22 DIAGNOSIS — E785 Hyperlipidemia, unspecified: Secondary | ICD-10-CM | POA: Diagnosis present

## 2011-05-22 DIAGNOSIS — E119 Type 2 diabetes mellitus without complications: Secondary | ICD-10-CM | POA: Diagnosis present

## 2011-05-22 DIAGNOSIS — G9340 Encephalopathy, unspecified: Secondary | ICD-10-CM | POA: Diagnosis present

## 2011-05-22 LAB — COMPREHENSIVE METABOLIC PANEL
ALT: 21 U/L (ref 0–35)
Albumin: 2.9 g/dL — ABNORMAL LOW (ref 3.5–5.2)
Alkaline Phosphatase: 74 U/L (ref 39–117)
GFR calc Af Amer: 72 mL/min — ABNORMAL LOW (ref >90)
Glucose, Bld: 260 mg/dL — ABNORMAL HIGH (ref 70–99)
Potassium: 2.6 mEq/L — CL (ref 3.5–5.1)
Sodium: 135 mEq/L (ref 135–145)
Total Protein: 7.1 g/dL (ref 6.0–8.3)

## 2011-05-22 LAB — GLUCOSE, CAPILLARY
Glucose-Capillary: 217 mg/dL — ABNORMAL HIGH (ref 70–99)
Glucose-Capillary: 98 mg/dL (ref 70–99)
Glucose-Capillary: 95 mg/dL (ref 70–99)

## 2011-05-22 LAB — PROTIME-INR: INR: 1.66 — ABNORMAL HIGH (ref 0.00–1.49)

## 2011-05-22 LAB — URINALYSIS, ROUTINE W REFLEX MICROSCOPIC
Bilirubin Urine: NEGATIVE
Glucose, UA: >1000 mg/dL — AB
Ketones, ur: NEGATIVE mg/dL
pH: 5.5 (ref 5.0–8.0)

## 2011-05-22 LAB — LACTIC ACID, PLASMA: Lactic Acid, Venous: 1.6 mmol/L (ref 0.5–2.2)

## 2011-05-22 LAB — CBC
MCH: 28.5 pg (ref 26.0–34.0)
Platelets: 363 10*3/uL (ref 150–400)
RBC: 4.71 MIL/uL (ref 3.87–5.11)
WBC: 12.7 10*3/uL — ABNORMAL HIGH (ref 4.0–10.5)

## 2011-05-22 LAB — DIFFERENTIAL
Eosinophils Absolute: 0 10*3/uL (ref 0.0–0.7)
Lymphs Abs: 1 10*3/uL (ref 0.7–4.0)
Neutro Abs: 10.6 10*3/uL — ABNORMAL HIGH (ref 1.7–7.7)
Neutrophils Relative %: 83 % — ABNORMAL HIGH (ref 43–77)

## 2011-05-22 LAB — URINE MICROSCOPIC-ADD ON

## 2011-05-22 MED ORDER — POTASSIUM CHLORIDE 10 MEQ/100ML IV SOLN
10.0000 meq | Freq: Once | INTRAVENOUS | Status: AC
  Administered 2011-05-22: 10 meq via INTRAVENOUS
  Filled 2011-05-22: qty 100

## 2011-05-22 MED ORDER — DOCUSATE SODIUM 100 MG PO CAPS
100.0000 mg | ORAL_CAPSULE | Freq: Two times a day (BID) | ORAL | Status: DC
  Administered 2011-05-22 – 2011-05-24 (×4): 100 mg via ORAL
  Filled 2011-05-22 (×7): qty 1

## 2011-05-22 MED ORDER — SIMVASTATIN 20 MG PO TABS
20.0000 mg | ORAL_TABLET | Freq: Every day | ORAL | Status: DC
  Administered 2011-05-22 – 2011-05-24 (×2): 20 mg via ORAL
  Filled 2011-05-22 (×4): qty 1

## 2011-05-22 MED ORDER — ACETAMINOPHEN 325 MG PO TABS
650.0000 mg | ORAL_TABLET | Freq: Four times a day (QID) | ORAL | Status: DC | PRN
  Administered 2011-05-24: 650 mg via ORAL
  Filled 2011-05-22: qty 2

## 2011-05-22 MED ORDER — ONDANSETRON HCL 4 MG PO TABS: 4.0000 mg | ORAL_TABLET | Freq: Four times a day (QID) | ORAL | Status: DC | PRN

## 2011-05-22 MED ORDER — INSULIN GLARGINE 100 UNIT/ML ~~LOC~~ SOLN
5.0000 [IU] | Freq: Every day | SUBCUTANEOUS | Status: DC
  Administered 2011-05-22: 5 [IU] via SUBCUTANEOUS

## 2011-05-22 MED ORDER — ADULT MULTIVITAMIN W/MINERALS CH
1.0000 | ORAL_TABLET | Freq: Every day | ORAL | Status: DC
  Administered 2011-05-23 – 2011-05-25 (×3): 1 via ORAL
  Filled 2011-05-22 (×3): qty 1

## 2011-05-22 MED ORDER — SODIUM CHLORIDE 0.9 % IV SOLN
INTRAVENOUS | Status: DC
  Administered 2011-05-22 (×3): via INTRAVENOUS

## 2011-05-22 MED ORDER — ONE-DAILY MULTI VITAMINS PO TABS: 1.0000 | ORAL_TABLET | Freq: Every day | ORAL | Status: DC

## 2011-05-22 MED ORDER — ACETAMINOPHEN 650 MG RE SUPP
650.0000 mg | Freq: Four times a day (QID) | RECTAL | Status: DC | PRN
  Administered 2011-05-25: 650 mg via RECTAL
  Filled 2011-05-22: qty 1

## 2011-05-22 MED ORDER — WARFARIN SODIUM 7.5 MG PO TABS
7.5000 mg | ORAL_TABLET | Freq: Once | ORAL | Status: AC
  Administered 2011-05-22: 7.5 mg via ORAL
  Filled 2011-05-22: qty 1

## 2011-05-22 MED ORDER — ONDANSETRON HCL 4 MG/2ML IJ SOLN
4.0000 mg | Freq: Three times a day (TID) | INTRAMUSCULAR | Status: AC | PRN
  Administered 2011-05-22: 4 mg via INTRAVENOUS
  Filled 2011-05-22: qty 2

## 2011-05-22 MED ORDER — METOPROLOL TARTRATE 12.5 MG HALF TABLET
12.5000 mg | ORAL_TABLET | Freq: Two times a day (BID) | ORAL | Status: DC
  Administered 2011-05-22 – 2011-05-23 (×2): 12.5 mg via ORAL
  Filled 2011-05-22 (×3): qty 1

## 2011-05-22 MED ORDER — DEXTROSE 5 % IV SOLN
1.0000 g | Freq: Once | INTRAVENOUS | Status: AC
  Administered 2011-05-22: 1 g via INTRAVENOUS
  Filled 2011-05-22: qty 10

## 2011-05-22 MED ORDER — POTASSIUM CHLORIDE CRYS ER 20 MEQ PO TBCR
40.0000 meq | EXTENDED_RELEASE_TABLET | Freq: Three times a day (TID) | ORAL | Status: AC
  Administered 2011-05-22 – 2011-05-23 (×3): 40 meq via ORAL
  Filled 2011-05-22 (×3): qty 2

## 2011-05-22 MED ORDER — ASPIRIN EC 81 MG PO TBEC
81.0000 mg | DELAYED_RELEASE_TABLET | Freq: Every day | ORAL | Status: DC
  Administered 2011-05-23 – 2011-05-25 (×3): 81 mg via ORAL
  Filled 2011-05-22 (×3): qty 1

## 2011-05-22 MED ORDER — SODIUM CHLORIDE 0.9 % IV SOLN: INTRAVENOUS | Status: AC

## 2011-05-22 MED ORDER — DILTIAZEM HCL ER 120 MG PO CP24
120.0000 mg | ORAL_CAPSULE | Freq: Every day | ORAL | Status: DC
  Administered 2011-05-23 – 2011-05-25 (×2): 120 mg via ORAL
  Filled 2011-05-22 (×3): qty 1

## 2011-05-22 MED ORDER — ONDANSETRON HCL 4 MG/2ML IJ SOLN
4.0000 mg | Freq: Once | INTRAMUSCULAR | Status: AC
  Administered 2011-05-22: 4 mg via INTRAVENOUS
  Filled 2011-05-22: qty 2

## 2011-05-22 MED ORDER — CALCIUM CARBONATE-VITAMIN D 500-200 MG-UNIT PO TABS
1.0000 | ORAL_TABLET | Freq: Every day | ORAL | Status: DC
  Administered 2011-05-23 – 2011-05-25 (×3): 1 via ORAL
  Filled 2011-05-22 (×4): qty 1

## 2011-05-22 MED ORDER — FENTANYL CITRATE 0.05 MG/ML IJ SOLN
50.0000 ug | Freq: Once | INTRAMUSCULAR | Status: AC
  Administered 2011-05-22: 50 ug via INTRAVENOUS
  Filled 2011-05-22: qty 2

## 2011-05-22 MED ORDER — INSULIN ASPART 100 UNIT/ML ~~LOC~~ SOLN
0.0000 [IU] | Freq: Three times a day (TID) | SUBCUTANEOUS | Status: DC
  Administered 2011-05-23 (×2): 3 [IU] via SUBCUTANEOUS
  Administered 2011-05-23: 2 [IU] via SUBCUTANEOUS

## 2011-05-22 MED ORDER — WARFARIN - PHARMACIST DOSING INPATIENT: Freq: Every day | Status: DC

## 2011-05-22 NOTE — ED Notes (Signed)
Pt undressed, in gown, on continuous pulse oximetry and blood pressure cuff; family at bedside 

## 2011-05-22 NOTE — ED Notes (Signed)
Jenny Reichmann [facility]) [phone]

## 2011-05-22 NOTE — ED Notes (Signed)
Admitting md at bedside

## 2011-05-22 NOTE — H&P (Signed)
DATE OF ADMISSION:  05/22/2011  PCP:    [name], MD, MD   Chief Complaint:   Recurrent Confusion, Headache and Nausea   HPI: [name] [name] is an 71 y.o. female who was seen at the [facility] ED and transferred to [facility] for persstent headache . An MRI study was performed and revealed a area of concern (enhancement in the left temoral lobe).  Patient was placed in droplet isolation for possible viral meningitis, and Infectious Dieases was contacted at that time.  She was treated with iv acyclovir and iv rocephin and iv vanco. In 3 days she improved amazingly. She was DCed to home and was placed on po abx for an e coli UTI.  Today the family brought her back for recurrent nausea and abdominal pain also with confusion and a mild headache.   Past Medical History  Diagnosis Date  . Blindness of left eye   . Eye muscle weakness     Right eye weakness after cataract surgery  . Common migraine     ?hx of  . DIABETES MELLITUS, TYPE II 03/27/2006  . HYPERLIPIDEMIA 03/27/2006  . 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 HYPERTENSION 03/27/2006  . Atrial fibrillation     SVT dx 2007, cath 2007 mild CAD, then had an cardioversion, ablation; still on coumadin , has occ palpitation, EKG 03-2010 NSR  . Headache 05/07/2009  . LUNG NODULE 09/01/2006    Excision, Bx Benighn  . Osteopenia 2004    Dexa 2004 showed Osteopenia, DEXA 03/2007 normal    History reviewed. No pertinent past surgical history.  Medications:  HOME MEDS: Prior to Admission medications   Medication Sig Start Date End Date Taking? Authorizing Provider  aspirin 325 MG tablet Take 650 mg by mouth daily.    Yes Historical Provider, MD  calcium-vitamin D (OSCAL WITH D 500-200) 500-200 MG-UNIT per tablet Take 1 tablet by mouth daily.     Yes Historical Provider, MD  diltiazem (CARDIZEM) 120 MG tablet Take 1 tablet (120 mg total) by mouth daily. 09/04/10  Yes Colon Branch, MD  Glucosamine-Chondroit-Vit C-Mn (GLUCOSAMINE 1500 COMPLEX PO)  Take by mouth.   Yes Historical Provider, MD  insulin NPH (HUMULIN N,NOVOLIN N) 100 UNIT/ML injection Inject into the skin. Give 70 units in the morning and 10 units in the afternoon 09/29/10  Yes Renato Shin, MD  irbesartan (AVAPRO) 150 MG tablet Take 1 tablet (150 mg total) by mouth daily. 09/04/10  Yes Colon Branch, MD  Multiple Vitamin (MULTIVITAMIN) tablet Take 1 tablet by mouth daily.     Yes Historical Provider, MD  pravastatin (PRAVACHOL) 40 MG tablet TAKE ONE TABLET BY MOUTH EVERY DAY 05/07/11  Yes Colon Branch, MD  triamcinolone (KENALOG) 0.025 % ointment Apply topically 3 (three) times daily as needed. For itching 04/09/11 04/08/12 Yes Renato Shin, MD  warfarin (COUMADIN) 5 MG tablet Take 5 mg by mouth as directed. Take 1 tab on Wed and Fri. Take 1/2 tab on Mon, Tues, Thurs, Sat and Sun  Anti-coagulant medication.  Patient needs a consult. 07/17/10  Yes Evans Lance, MD    Allergies:  Allergies  Allergen Reactions  . Procaine Hcl Anaphylaxis    Social History:   reports that she quit smoking about 33 years ago. She does not have any smokeless tobacco history on file. She reports that she does not drink alcohol or use illicit drugs.  Family History: Family History  Problem Relation Age of Onset  . Diabetes  Father   . Heart attack Father 82  . Diabetes Sister   . Cancer Neg Hx     no hx of colon or breast cancer    Review of Systems:  Reports she has diffuse abdominal pain  The patient denies weight loss,, vision loss, decreased hearing, hoarseness, chest pain, syncope, dyspnea on exertion, peripheral edema, balance deficits, hemoptysis, , melena, hematochezia, severe indigestion/heartburn, hematuria, incontinence, genital sores, muscle weakness, suspicious skin lesions, transient blindness, difficulty walking, depression, unusual weight change, abnormal bleeding,  Physical Exam:  GEN:  Pleasant examined  and in no acute distress; cooperative with exam Filed Vitals:   05/22/11  1057 05/22/11 1140 05/22/11 1155 05/22/11 1253  BP: 176/72 162/61  169/68  Pulse: 62 60  61  Temp:      TempSrc:      Resp: 16 16  15   SpO2: 94% 95% 99% 100%   Blood pressure 169/68, pulse 61, temperature 98 F (36.7 C), temperature source Oral, resp. rate 15, SpO2 100.00%. PSYCH: SHe is alert and oriented x4; does not appear anxious does not appear depressed; affect is normal. Stays with eyes closed  HEENT: Normocephalic and Atraumatic, Mucous membranes pink; PERRLA; EOM intact;   No scleral icterus, Nares: Patent, Oropharynx: Clear, Edentulous or Fair Dentition, Neck:  FROM, no cervical lymphadenopathy nor thyromegaly or carotid bruit; no JVD;  CHEST WALL: No tenderness CHEST: Normal respiration, clear to auscultation bilaterally HEART: Regular rate and rhythm; no murmurs rubs or gallops BACK: No kyphosis or scoliosis; no CVA tenderness ABDOMEN: Positive Bowel Sounds,  Obese, soft, marked suprapubic tenderness,  no masses, no organomegaly, no pannus; no intertriginous candida. Rectal Exam: Not done EXTREMITIES: No bone or joint deformity; age-appropriate arthropathy of the hands and knees; no cyanosis, clubbing or edema; no ulcerations. Genitalia: not examined PULSES: 2+ and symmetric SKIN: Normal hydration no rash or ulceration CNS: Cranial nerves 2-12 grossly intact no focal neurologic deficit   Labs & Imaging Results for orders placed during the hospital encounter of 05/22/11 (from the past 48 hour(s))  AMMONIA     Status: Normal   Collection Time   05/22/11  9:49 AM      Component Value Range Comment   Ammonia 39  11 - 60 (umol/L)   CBC     Status: Abnormal   Collection Time   05/22/11 10:15 AM      Component Value Range Comment   WBC 12.7 (*) 4.0 - 10.5 (K/uL)    RBC 4.71  3.87 - 5.11 (MIL/uL)    Hemoglobin 13.4  12.0 - 15.0 (g/dL)    HCT 40.7  36.0 - 46.0 (%)    MCV 86.4  78.0 - 100.0 (fL)    MCH 28.5  26.0 - 34.0 (pg)    MCHC 32.9  30.0 - 36.0 (g/dL)    RDW 14.1   11.5 - 15.5 (%)    Platelets 363  150 - 400 (K/uL)   DIFFERENTIAL     Status: Abnormal   Collection Time   05/22/11 10:15 AM      Component Value Range Comment   Neutrophils Relative 83 (*) 43 - 77 (%)    Neutro Abs 10.6 (*) 1.7 - 7.7 (K/uL)    Lymphocytes Relative 8 (*) 12 - 46 (%)    Lymphs Abs 1.0  0.7 - 4.0 (K/uL)    Monocytes Relative 9  3 - 12 (%)    Monocytes Absolute 1.1 (*) 0.1 - 1.0 (K/uL)    Eosinophils  Relative 0  0 - 5 (%)    Eosinophils Absolute 0.0  0.0 - 0.7 (K/uL)    Basophils Relative 0  0 - 1 (%)    Basophils Absolute 0.0  0.0 - 0.1 (K/uL)   COMPREHENSIVE METABOLIC PANEL     Status: Abnormal   Collection Time   05/22/11 10:15 AM      Component Value Range Comment   Sodium 135  135 - 145 (mEq/L)    Potassium 2.6 (*) 3.5 - 5.1 (mEq/L)    Chloride 91 (*) 96 - 112 (mEq/L)    CO2 33 (*) 19 - 32 (mEq/L)    Glucose, Bld 260 (*) 70 - 99 (mg/dL)    BUN 17  6 - 23 (mg/dL)    Creatinine, Ser 0.92  0.50 - 1.10 (mg/dL)    Calcium 9.7  8.4 - 10.5 (mg/dL)    Total Protein 7.1  6.0 - 8.3 (g/dL)    Albumin 2.9 (*) 3.5 - 5.2 (g/dL)    AST 26  0 - 37 (U/L)    ALT 21  0 - 35 (U/L)    Alkaline Phosphatase 74  39 - 117 (U/L)    Total Bilirubin 0.5  0.3 - 1.2 (mg/dL)    GFR calc non Af Amer 62 (*) >90 (mL/min)    GFR calc Af Amer 72 (*) >90 (mL/min)   URINALYSIS, ROUTINE W REFLEX MICROSCOPIC     Status: Abnormal   Collection Time   05/22/11 10:56 AM      Component Value Range Comment   Color, Urine YELLOW  YELLOW     APPearance CLOUDY (*) CLEAR     Specific Gravity, Urine 1.018  1.005 - 1.030     pH 5.5  5.0 - 8.0     Glucose, UA >1000 (*) NEGATIVE (mg/dL)    Hgb urine dipstick SMALL (*) NEGATIVE     Bilirubin Urine NEGATIVE  NEGATIVE     Ketones, ur NEGATIVE  NEGATIVE (mg/dL)    Protein, ur 100 (*) NEGATIVE (mg/dL)    Urobilinogen, UA 0.2  0.0 - 1.0 (mg/dL)    Nitrite NEGATIVE  NEGATIVE     Leukocytes, UA MODERATE (*) NEGATIVE    URINE MICROSCOPIC-ADD ON     Status:  Abnormal   Collection Time   05/22/11 10:56 AM      Component Value Range Comment   Squamous Epithelial / LPF RARE  RARE     WBC, UA TOO NUMEROUS TO COUNT  <3 (WBC/hpf)    RBC / HPF 0-2  <3 (RBC/hpf)    Bacteria, UA MANY (*) RARE     Urine-Other MUCOUS PRESENT   AMORPHOUS URATES/PHOSPHATES  LACTIC ACID, PLASMA     Status: Normal   Collection Time   05/22/11 12:02 PM      Component Value Range Comment   Lactic Acid, Venous 1.6  0.5 - 2.2 (mmol/L)    No results found.  Specimen Description:    URINE, RANDOM   Special Requests:    NONE   Culture Setup Time:    [id number]   Colony Count:    >=100,000 COLONIES/ML   Culture:    ESCHERICHIA COLI   Report Status:    05/20/2011 FINAL   Organism ID, Bacteria:    ESCHERICHIA COLI      Culture & Susceptibility     ESCHERICHIA COLI        Antibiotic  Sensitivity  Microscan  Status     AMPICILLIN  Sensitive  <=2  Final     Method:  MIC     CEFAZOLIN  Sensitive  <=4  Final     Method:  MIC     CEFTRIAXONE  Sensitive  <=1  Final     Method:  MIC     CIPROFLOXACIN  Sensitive  <=0.25  Final     Method:  MIC     GENTAMICIN  Sensitive  <=1  Final     Method:  MIC     LEVOFLOXACIN  Sensitive  <=0.12  Final     Method:  MIC     NITROFURANTOIN  Sensitive  <=16  Final     Method:  MIC     PIP/TAZO  Sensitive  <=4  Final     Method:  MIC     TOBRAMYCIN  Sensitive  <=1  Final     Method:  MIC     TRIMETH/SULFA  Sensitive  <=20  Final     Method:  MIC      Comments  ESCHERICHIA COLI (MIC)       ESCHERICHIA COLI      Assessment: Present on Admission:  .Toxic metabolic encephalopathy .UTI (urinary tract infection) .DIABETES MELLITUS, TYPE II .Atrial fibrillation .PERIPHERAL NEUROPATHY .Hypokalemia .HYPERTENSION .HYPERLIPIDEMIA  71 year old woman brought in by family with recurrent encephalopathy. Most likely due to Escherichia coli urinary tract infection. I am concerned about the findings on the MRI done during previous  admission. If the patient does not improve quickly with intravenous antibiotics and intravenous fluids she will need to be restarted on intravenous acyclovir and a lumbar puncture will have to be obtained.  The patient's known atrial fibrillation we will continue the rate controlling medications and Coumadin without changes The patient will be treated with Lantus and NovoLog insulin for diabetes   CODE STATUS:      FULL CODE               Tal Neer 05/22/2011, 1:48 PM

## 2011-05-22 NOTE — ED Notes (Signed)
[id number] Ready

## 2011-05-22 NOTE — ED Notes (Signed)
Pt somnolent but easily aroused. Awaiting consult.

## 2011-05-22 NOTE — Consult Note (Signed)
ANTICOAGULATION CONSULT NOTE - Initial Consult  Pharmacy Consult for Coumadin Indication: atrial fibrillation  Allergies  Allergen Reactions  . Procaine Hcl Anaphylaxis   Vital Signs: Temp: 98 F (36.7 C) (03/22 0926) Temp src: Oral (03/22 0926) BP: 169/68 mmHg (03/22 1253) Pulse Rate: 61  (03/22 1253)  Labs:  Basename 05/22/11 1404 05/22/11 1015  HGB -- 13.4  HCT -- 40.7  PLT -- 363  APTT -- --  LABPROT 19.9* --  INR 1.66* --  HEPARINUNFRC -- --  CREATININE -- 0.92  CKTOTAL -- --  CKMB -- --  TROPONINI -- --   The CrCl is unknown because both a height and weight (above a minimum accepted value) are required for this calculation.  Medical History: Past Medical History  Diagnosis Date  . Blindness of left eye   . Eye muscle weakness     Right eye weakness after cataract surgery  . Common migraine     ?hx of  . DIABETES MELLITUS, TYPE II 03/27/2006  . HYPERLIPIDEMIA 03/27/2006  . 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 HYPERTENSION 03/27/2006  . Atrial fibrillation     SVT dx 2007, cath 2007 mild CAD, then had an cardioversion, ablation; still on coumadin , has occ palpitation, EKG 03-2010 NSR  . Headache 05/07/2009  . LUNG NODULE 09/01/2006    Excision, Bx Benighn  . Osteopenia 2004    Dexa 2004 showed Osteopenia, DEXA 03/2007 normal   Medications:  Coumadin 2.5mg  daily except 5mg  on Wednesday and Friday.  Assessment: 70yof recently discharged from the hospital on 3/19 returns with AMS and vomiting.  She is on coumadin pta for afib and will continue this. INR of 1.66 on admission is below goal. She has not had a dose yet today. Questionable if she absorbed doses at home with reported nausea and vomiting for several days. On previous admission, patient came in with a similar INR of 1.63, was given higher doses, and INR actually decreased.  Goal of Therapy:  INR 2-3   Plan:  1) Coumadin 7.5mg  x 1 2) Daily INR  Deboraha Sprang 05/22/2011,2:41 PM

## 2011-05-22 NOTE — ED Notes (Signed)
Diet tray ordered 

## 2011-05-22 NOTE — ED Notes (Signed)
Pt was just released from the hospital following vomiting episode. She is a poor historian and cannot tell me what her diagnosis was. Her family at the bedside states that she started to become confused about wed afternoon.

## 2011-05-22 NOTE — ED Notes (Signed)
Pt reporting nausea, vomiting x several days. Reporting released from hospital on Tuesday. Since then pt reporting nausea, vomiting, denying any diarrhea. Decreased appetite. Family reporting confusion. "Pt not acting like herself". Reporting pt has been sitting in chair all day only getting up to use the bathroom which is not pt's baseline. Reporting trouble with ambulation, weakness. At this time pt a & o x 4 . No neuro deficts noted. Pt spitting into emesis bag but no active vomiting. C/o headache and nausea, pain in stomach. Pt resting with eyes closed, making occasional moaning noises.

## 2011-05-22 NOTE — ED Notes (Signed)
Pt placed on monitor and oxygen [place] (2L); family at bedside

## 2011-05-22 NOTE — ED Provider Notes (Signed)
History     CSN: [id number]  Arrival date & time 05/22/11  [id number]   First MD Initiated Contact with Patient 05/22/11 0930      Chief Complaint  Patient presents with  . Emesis  . Altered Mental Status     HPI Pt was just released from the hospital following vomiting episode. She is a poor historian and cannot tell me what her diagnosis was. Her family at the bedside states that she started to become confused about wed afternoon.Also persistent vomiting.  No fever per husband.    Past Medical History  Diagnosis Date  . Blindness of left eye   . Eye muscle weakness     Right eye weakness after cataract surgery  . Common migraine     ?hx of  . DIABETES MELLITUS, TYPE II 03/27/2006  . HYPERLIPIDEMIA 03/27/2006  . 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 HYPERTENSION 03/27/2006  . Atrial fibrillation     SVT dx 2007, cath 2007 mild CAD, then had an cardioversion, ablation; still on coumadin , has occ palpitation, EKG 03-2010 NSR  . Headache 05/07/2009  . LUNG NODULE 09/01/2006    Excision, Bx Benighn  . Osteopenia 2004    Dexa 2004 showed Osteopenia, DEXA 03/2007 normal    History reviewed. No pertinent past surgical history.  Family History  Problem Relation Age of Onset  . Diabetes Father   . Heart attack Father 41  . Diabetes Sister   . Cancer Neg Hx     no hx of colon or breast cancer    History  Substance Use Topics  . Smoking status: Former Smoker    Quit date: 03/02/1978  . Smokeless tobacco: Not on file  . Alcohol Use: No    OB History    Grav Para Term Preterm Abortions TAB SAB Ect Mult Living                  Review of Systems  All other systems reviewed and are negative.    Allergies  Procaine hcl  Home Medications   Current Outpatient Rx  Name Route Sig Dispense Refill  . ASPIRIN EC 81 MG PO TBEC Oral Take 81 mg by mouth daily.    [name] CALCIUM CARBONATE-VITAMIN D 500-200 MG-UNIT PO TABS Oral Take 1 tablet by mouth daily.    [name] CEFPROZIL 500 MG PO  TABS Oral Take 500 mg by mouth 2 (two) times daily.    [name] DILTIAZEM HCL ER 120 MG PO CP24 Oral Take 120 mg by mouth daily.    . INSULIN ISOPHANE & REGULAR (70-30) 100 UNIT/ML [place] SUSP Subcutaneous Inject 10-50 Units into the skin 2 (two) times daily with a meal. 50 unit in am and 10-15 units in the pm.    . IRBESARTAN 150 MG PO TABS Oral Take 150 mg by mouth at bedtime.    [name] METOPROLOL TARTRATE 25 MG PO TABS Oral Take 12.5 mg by mouth 2 (two) times daily.    [name] ONE-DAILY MULTI VITAMINS PO TABS Oral Take 1 tablet by mouth daily.      [name] PRAVASTATIN SODIUM 40 MG PO TABS Oral Take 40 mg by mouth daily.    . WARFARIN SODIUM 5 MG PO TABS Oral Take 5 mg by mouth as directed. Take 1 tab on Wed and Fri. Take 1/2 tab on Mon, Tues, Thurs, Sat and Sun  Anti-coagulant medication.  Patient needs a consult.      BP 169/68  Pulse 61  Temp(Src)  98 F (36.7 C) (Oral)  Resp 15  SpO2 100%  Physical Exam  Nursing note and vitals reviewed. Constitutional: She is oriented to person, place, and time. She appears well-developed and well-nourished. No distress.  HENT:  Head: Normocephalic and atraumatic.  Eyes: Pupils are equal, round, and reactive to light.  Neck: Normal range of motion.  Cardiovascular: Intact distal pulses.   Pulmonary/Chest: Effort normal. No respiratory distress. She has no wheezes. She exhibits no tenderness.  Abdominal: Normal appearance. She exhibits no distension. There is no tenderness. There is no rebound and no guarding.  Musculoskeletal: Normal range of motion.  Neurological: She is alert and oriented to person, place, and time. No cranial nerve deficit.  Skin: Skin is warm and dry. No rash noted.  Psychiatric: She has a normal mood and affect. Her behavior is normal.    ED Course  Procedures (including critical care time)   Scheduled Meds:   . sodium chloride   Intravenous STAT  . cefTRIAXone (ROCEPHIN)  IV  1 g Intravenous Once  . fentaNYL  50 mcg Intravenous Once  .  ondansetron  4 mg Intravenous Once  . potassium chloride  10 mEq Intravenous Once   Continuous Infusions:   . sodium chloride 125 mL/hr at 05/22/11 1125   PRN Meds:.ondansetron (ZOFRAN) IV  Labs Reviewed  CBC - Abnormal; Notable for the following:    WBC 12.7 (*)    All other components within normal limits  DIFFERENTIAL - Abnormal; Notable for the following:    Neutrophils Relative 83 (*)    Neutro Abs 10.6 (*)    Lymphocytes Relative 8 (*)    Monocytes Absolute 1.1 (*)    All other components within normal limits  COMPREHENSIVE METABOLIC PANEL - Abnormal; Notable for the following:    Potassium 2.6 (*)    Chloride 91 (*)    CO2 33 (*)    Glucose, Bld 260 (*)    Albumin 2.9 (*)    GFR calc non Af Amer 62 (*)    GFR calc Af Amer 72 (*)    All other components within normal limits  URINALYSIS, ROUTINE W REFLEX MICROSCOPIC - Abnormal; Notable for the following:    APPearance CLOUDY (*)    Glucose, UA >1000 (*)    Hgb urine dipstick SMALL (*)    Protein, ur 100 (*)    Leukocytes, UA MODERATE (*)    All other components within normal limits  URINE MICROSCOPIC-ADD ON - Abnormal; Notable for the following:    Bacteria, UA MANY (*)    All other components within normal limits  AMMONIA  LACTIC ACID, PLASMA  URINE CULTURE   No results found.   1. UTI (lower urinary tract infection)   2. Fever   3. Hypokalemia       MDM          Dot Lanes, MD 05/22/11 [phone]

## 2011-05-23 ENCOUNTER — Inpatient Hospital Stay (HOSPITAL_COMMUNITY): Payer: Medicare Other

## 2011-05-23 DIAGNOSIS — N390 Urinary tract infection, site not specified: Secondary | ICD-10-CM | POA: Diagnosis present

## 2011-05-23 DIAGNOSIS — R404 Transient alteration of awareness: Secondary | ICD-10-CM

## 2011-05-23 LAB — CBC
MCV: 86.8 fL (ref 78.0–100.0)
Platelets: 342 10*3/uL (ref 150–400)
RDW: 14.4 % (ref 11.5–15.5)
WBC: 14.1 10*3/uL — ABNORMAL HIGH (ref 4.0–10.5)

## 2011-05-23 LAB — BASIC METABOLIC PANEL
Calcium: 8.5 mg/dL (ref 8.4–10.5)
Creatinine, Ser: 0.7 mg/dL (ref 0.50–1.10)
GFR calc Af Amer: >90 mL/min (ref >90)

## 2011-05-23 LAB — PROTIME-INR: INR: 2.07 — ABNORMAL HIGH (ref 0.00–1.49)

## 2011-05-23 LAB — GLUCOSE, CAPILLARY
Glucose-Capillary: 188 mg/dL — ABNORMAL HIGH (ref 70–99)
Glucose-Capillary: 214 mg/dL — ABNORMAL HIGH (ref 70–99)

## 2011-05-23 MED ORDER — METOPROLOL TARTRATE 1 MG/ML IV SOLN
2.5000 mg | Freq: Two times a day (BID) | INTRAVENOUS | Status: DC
  Administered 2011-05-23 – 2011-05-25 (×4): 2.5 mg via INTRAVENOUS
  Filled 2011-05-23 (×5): qty 5

## 2011-05-23 MED ORDER — PHENYTOIN SODIUM 50 MG/ML IJ SOLN
100.0000 mg | Freq: Three times a day (TID) | INTRAMUSCULAR | Status: DC
  Administered 2011-05-24: 100 mg via INTRAVENOUS
  Filled 2011-05-23 (×5): qty 2

## 2011-05-23 MED ORDER — LORAZEPAM 2 MG/ML IJ SOLN
1.0000 mg | Freq: Once | INTRAMUSCULAR | Status: DC
  Filled 2011-05-23 (×2): qty 1

## 2011-05-23 MED ORDER — HYDRALAZINE HCL 20 MG/ML IJ SOLN: 5.0000 mg | Freq: Once | INTRAMUSCULAR | Status: DC

## 2011-05-23 MED ORDER — LORAZEPAM 2 MG/ML IJ SOLN
1.0000 mg | Freq: Once | INTRAMUSCULAR | Status: AC
  Administered 2011-05-23: 1 mg via INTRAVENOUS
  Filled 2011-05-23: qty 1

## 2011-05-23 MED ORDER — ATORVASTATIN CALCIUM 10 MG PO TABS
10.0000 mg | ORAL_TABLET | Freq: Every day | ORAL | Status: DC
  Administered 2011-05-24: 10 mg via ORAL
  Filled 2011-05-23 (×3): qty 1

## 2011-05-23 MED ORDER — DEXTROSE 5 % IV SOLN
10.0000 mg/kg | Freq: Three times a day (TID) | INTRAVENOUS | Status: DC
  Administered 2011-05-24 – 2011-05-25 (×5): 500 mg via INTRAVENOUS
  Filled 2011-05-23 (×8): qty 10

## 2011-05-23 MED ORDER — SODIUM CHLORIDE 0.9 % IV SOLN
1000.0000 mg | INTRAVENOUS | Status: AC
  Administered 2011-05-23: 1000 mg via INTRAVENOUS
  Filled 2011-05-23: qty 20

## 2011-05-23 MED ORDER — WARFARIN SODIUM 2.5 MG PO TABS
2.5000 mg | ORAL_TABLET | Freq: Once | ORAL | Status: DC
  Filled 2011-05-23: qty 1

## 2011-05-23 MED ORDER — LORAZEPAM 2 MG/ML IJ SOLN
INTRAMUSCULAR | Status: AC
  Filled 2011-05-23: qty 1

## 2011-05-23 MED ORDER — LORAZEPAM 2 MG/ML IJ SOLN
1.0000 mg | Freq: Once | INTRAMUSCULAR | Status: AC
  Administered 2011-05-23: 1 mg via INTRAVENOUS

## 2011-05-23 MED ORDER — DEXTROSE 5 % IV SOLN
1.0000 g | INTRAVENOUS | Status: DC
  Administered 2011-05-23 – 2011-05-25 (×3): 1 g via INTRAVENOUS
  Filled 2011-05-23 (×3): qty 10

## 2011-05-23 MED ORDER — VITAMIN K1 1 MG/0.5ML IJ SOLN: 5.0000 mg | INTRAMUSCULAR | Status: DC

## 2011-05-23 MED ORDER — INSULIN ASPART 100 UNIT/ML ~~LOC~~ SOLN
0.0000 [IU] | SUBCUTANEOUS | Status: DC
  Administered 2011-05-24 (×2): 5 [IU] via SUBCUTANEOUS

## 2011-05-23 MED ORDER — DEXTROSE-NACL 5-0.9 % IV SOLN
INTRAVENOUS | Status: DC
  Administered 2011-05-23: 23:00:00 via INTRAVENOUS

## 2011-05-23 MED ORDER — HYDRALAZINE HCL 20 MG/ML IJ SOLN
10.0000 mg | Freq: Four times a day (QID) | INTRAMUSCULAR | Status: DC | PRN
  Administered 2011-05-24 – 2011-05-25 (×2): 10 mg via INTRAVENOUS
  Filled 2011-05-23: qty 0.5
  Filled 2011-05-23 (×2): qty 1

## 2011-05-23 NOTE — Progress Notes (Signed)
Narrative: 71 yo woman with hx of DM, admitted 1 wk ago for ams - cleared - sent home - now back with the same issue - UTI vs encephalitis    Subjective:   Still very confused despite being on abx for 24 hrs        Physical Exam: Blood pressure 185/80, pulse 73, temperature 99.2 F (37.3 C), temperature source Oral, resp. rate 20, height 5\' 2"  (1.575 m), weight 96.435 kg (212 lb 9.6 oz), SpO2 92.00%. Patient Vitals for the past 24 hrs:  BP Temp Temp src Pulse Resp SpO2 Height Weight  05/23/11 0500 185/80 mmHg 99.2 F (37.3 C) Oral 73  20  92 % - -  05/22/11 2100 176/69 mmHg 98.8 F (37.1 C) Oral 65  20  93 % - -  05/22/11 1839 176/76 mmHg 97.5 F (36.4 C) Oral 85  18  95 % 5\' 2"  (1.575 m) 96.435 kg (212 lb 9.6 oz)  05/22/11 1646 158/64 mmHg - - 64  18  98 % - -  05/22/11 1253 169/68 mmHg - - 61  15  100 % - -  05/22/11 1155 - - - - - 99 % - -  05/22/11 1140 162/61 mmHg - - 60  16  95 % - -   Arousable CVS: RRR RS: CTAB Abdomen: soft   CBG (last 3)   Basename 05/23/11 0847 05/22/11 2156 05/22/11 1844  GLUCAP 188* 101* 95      Investigations:  Recent Results (from the past 240 hour(s))  CULTURE, BLOOD (ROUTINE X 2)     Status: Normal (Preliminary result)   Collection Time   05/17/11  7:33 PM      Component Value Range Status Comment   Specimen Description BLOOD LEFT ARM   Final    Special Requests     Final    Value: BOTTLES DRAWN AEROBIC AND ANAEROBIC 10CC AEROBIC 8CC ANAEROBIC   Culture  Setup Time [id number]   Final    Culture     Final    Value:        BLOOD CULTURE RECEIVED NO GROWTH TO DATE CULTURE WILL BE HELD FOR 5 DAYS BEFORE ISSUING A FINAL NEGATIVE REPORT   Report Status PENDING   Incomplete   CULTURE, BLOOD (ROUTINE X 2)     Status: Normal (Preliminary result)   Collection Time   05/17/11  7:38 PM      Component Value Range Status Comment   Specimen Description BLOOD LEFT ARM   Final    Special Requests     Final    Value: BOTTLES DRAWN  AEROBIC AND ANAEROBIC 10CC AEROBIC 8CC ANAEROBIC   Culture  Setup Time [id number]   Final    Culture     Final    Value:        BLOOD CULTURE RECEIVED NO GROWTH TO DATE CULTURE WILL BE HELD FOR 5 DAYS BEFORE ISSUING A FINAL NEGATIVE REPORT   Report Status PENDING   Incomplete   URINE CULTURE     Status: Normal   Collection Time   05/18/11  3:02 AM      Component Value Range Status Comment   Specimen Description URINE, RANDOM   Final    Special Requests NONE   Final    Culture  Setup Time [id number]   Final    Colony Count >=100,000 COLONIES/ML   Final    Culture ESCHERICHIA COLI   Final    Report Status 05/20/2011 FINAL  Final    Organism ID, Bacteria ESCHERICHIA COLI   Final      Basic Metabolic Panel:  Basename 05/23/11 0645 05/22/11 1015  NA 134* 135  K 4.1 2.6*  CL 92* 91*  CO2 32 33*  GLUCOSE 169* 260*  BUN 15 17  CREATININE 0.70 0.92  CALCIUM 8.5 9.7  MG -- --  PHOS -- --   Liver Function Tests:  Basename 05/22/11 1015  AST 26  ALT 21  ALKPHOS 74  BILITOT 0.5  PROT 7.1  ALBUMIN 2.9*     CBC:  Basename 05/23/11 0645 05/22/11 1015  WBC 14.1* 12.7*  NEUTROABS -- 10.6*  HGB 13.6 13.4  HCT 40.6 40.7  MCV 86.8 86.4  PLT 342 363    No results found.    Medications:  Scheduled:    . sodium chloride   Intravenous STAT  . aspirin EC  81 mg Oral Daily  . calcium-vitamin D  1 tablet Oral Q breakfast  . cefTRIAXone (ROCEPHIN)  IV  1 g Intravenous Once  . cefTRIAXone (ROCEPHIN)  IV  1 g Intravenous Q24H  . diltiazem  120 mg Oral Daily  . docusate sodium  100 mg Oral BID  . fentaNYL  50 mcg Intravenous Once  . insulin aspart  0-9 Units Subcutaneous TID WC  . insulin glargine  5 Units Subcutaneous QHS  . metoprolol tartrate  12.5 mg Oral BID  . mulitivitamin with minerals  1 tablet Oral Daily  . ondansetron  4 mg Intravenous Once  . potassium chloride  10 mEq Intravenous Once  . potassium chloride  40 mEq Oral TID  . simvastatin  20 mg Oral QHS    . warfarin  7.5 mg Oral ONCE-1800  . DISCONTD: hydrALAZINE  5 mg Intravenous Once  . DISCONTD: multivitamin  1 tablet Oral Daily  . DISCONTD: warfarin  2.5 mg Oral ONCE-1800  . DISCONTD: Warfarin - Pharmacist Dosing Inpatient   Does not apply q1800   Continuous:    . sodium chloride 10 mL/hr at 05/23/11 0744   [id number], acetaminophen, ondansetron (ZOFRAN) IV, ondansetron  Impression:  Principal Problem:  *Toxic metabolic encephalopathy Active Problems:  E. coli UTI (urinary tract infection)  Hypokalemia  DIABETES MELLITUS, TYPE II  HYPERLIPIDEMIA  PERIPHERAL NEUROPATHY  HYPERTENSION  Atrial fibrillation     Plan: D/w ID Obtain LP to r/o meningitis Cont rocephin for e coli uti       LOS: 1 day   Chariah Bailey, MD Pager: [phone] 05/23/2011, 11:02 AM

## 2011-05-23 NOTE — Progress Notes (Signed)
[facility] hospitalist progress note. Chief complaint. Altered mental status. History of present illness. This 71 year old female in hospital with altered mental status thought toxic metabolic encephalopathy due to urinary tract infection. Nursing was concerned that the patient was demonstrating increased confusion from her baseline confusion and called rapid response. I also saw the patient at the bedside. I find the patient alert though minimally verbal. She does follow some commands. She is in no acute distress. Nursing was concerned that the patient did not appear to be swallowing well well during last observed intake. Nursing questioned dysphagia. Vital signs temperature 98.3, pulse 82, respiration 18, blood pressure 194/81. O2 sats 94%. General appearance. This a frail elderly female in no distress. Cardiac. Rate and rhythm regular. Lungs. Breath sounds are clear. Diminished with poor inspiratory effort. Stable O2 sats. Abdomen. Soft with positive bowel sounds. No pain. Impression/plan. Problem #1 encephalopathy versus delirium. Probable causative factor urinary tract infection. I believe the patient is probably at or near her recent baseline. A nursing bedside swallow evaluation was done and the patient did not pass. I have made the patient n.p.o. I requested speech therapy evaluate swallowing. A consult has been ordered. Problem #2. Hypertension. I left an order for when necessary hydralazine. Problem #3. Atrial fibrillation.  I have changed Metroprolol to 2.5 mg IV every 12 hours in hopes of controlling the patient's rate. If she becomes tachycardic with rapid ventricular response may need to consider Cardizem drip.

## 2011-05-23 NOTE — Consult Note (Signed)
Infectious Diseases Initial Consultation  Reason for Consultation:  Patient with possible encephalitis.   HPI: [place] is a 71 y.o. female with a history of DM and afib who was recently hospitalized with what was initially altered mental status.  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  However, the day after admission, her mental status returned to normal and I decided no LP was needed due to spontaneous resolution which would not be consistent with encephalitis.  The patient did have a fever to 100.9 then but resolved.  She comes in again with similar complaints.  In discussion with the patients husband, who is at the bedside, she has had issues of delirium that he describes as talking about things that aren't there and doing things that are not her norm.  She also has been more sleepy today.  No recent medication changes.    Past Medical History  Diagnosis Date  . Blindness of left eye   . Eye muscle weakness     Right eye weakness after cataract surgery  . Common migraine     ?hx of  . DIABETES MELLITUS, TYPE II 03/27/2006  . HYPERLIPIDEMIA 03/27/2006  . 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 HYPERTENSION 03/27/2006  . Atrial fibrillation     SVT dx 2007, cath 2007 mild CAD, then had an cardioversion, ablation; still on coumadin , has occ palpitation, EKG 03-2010 NSR  . Headache 05/07/2009  . LUNG NODULE 09/01/2006    Excision, Bx Benighn  . Osteopenia 2004    Dexa 2004 showed Osteopenia, DEXA 03/2007 normal    Allergies:  Allergies  Allergen Reactions  . Procaine Hcl Anaphylaxis    Current antibiotics:   MEDICATIONS:    . sodium chloride   Intravenous STAT  . aspirin EC  81 mg Oral Daily  . atorvastatin  10 mg Oral q1800  . calcium-vitamin D  1 tablet Oral Q breakfast  . cefTRIAXone (ROCEPHIN)  IV  1 g Intravenous Q24H  . diltiazem  120 mg Oral Daily  . docusate  sodium  100 mg Oral BID  . insulin aspart  0-9 Units Subcutaneous TID WC  . insulin glargine  5 Units Subcutaneous QHS  . metoprolol tartrate  12.5 mg Oral BID  . mulitivitamin with minerals  1 tablet Oral Daily  . potassium chloride  40 mEq Oral TID  . simvastatin  20 mg Oral QHS  . warfarin  7.5 mg Oral ONCE-1800  . DISCONTD: hydrALAZINE  5 mg Intravenous Once  . DISCONTD: multivitamin  1 tablet Oral Daily  . DISCONTD: warfarin  2.5 mg Oral ONCE-1800  . DISCONTD: Warfarin - Pharmacist Dosing Inpatient   Does not apply q1800    History  Substance Use Topics  . Smoking status: Former Smoker    Quit date: 03/02/1978  . Smokeless tobacco: Not on file  . Alcohol Use: No    Family History  Problem Relation Age of Onset  . Diabetes Father   . Heart attack Father 45  . Diabetes Sister   . Cancer Neg Hx     no hx of colon or breast cancer    Review of Systems - Negative except as per the HPI.  It was obtained from the husband, is unobtainable from the patient.   OBJECTIVE: Temp:  [97.5 F (36.4 C)-99.2 F (37.3 C)] 98.2 F (36.8 C) (03/23 1300) Pulse Rate:  [64-108]  108  (03/23 1300) Resp:  [18-20] 18  (03/23 1300) BP: (107-185)/(64-80) 107/71 mmHg (03/23 1300) SpO2:  [92 %-100 %] 100 % (03/23 1300) Weight:  [212 lb 9.6 oz (96.435 kg)] 212 lb 9.6 oz (96.435 kg) (03/22 1839) General appearance: sleeping CV - RRR Lungs - CTA B Abd - soft, ntnd, +BS  LABS: Results for orders placed during the hospital encounter of 05/22/11 (from the past 48 hour(s))  AMMONIA     Status: Normal   Collection Time   05/22/11  9:49 AM      Component Value Range Comment   Ammonia 39  11 - 60 (umol/L)   CBC     Status: Abnormal   Collection Time   05/22/11 10:15 AM      Component Value Range Comment   WBC 12.7 (*) 4.0 - 10.5 (K/uL)    RBC 4.71  3.87 - 5.11 (MIL/uL)    Hemoglobin 13.4  12.0 - 15.0 (g/dL)    HCT 40.7  36.0 - 46.0 (%)    MCV 86.4  78.0 - 100.0 (fL)    MCH 28.5  26.0 - 34.0  (pg)    MCHC 32.9  30.0 - 36.0 (g/dL)    RDW 14.1  11.5 - 15.5 (%)    Platelets 363  150 - 400 (K/uL)   DIFFERENTIAL     Status: Abnormal   Collection Time   05/22/11 10:15 AM      Component Value Range Comment   Neutrophils Relative 83 (*) 43 - 77 (%)    Neutro Abs 10.6 (*) 1.7 - 7.7 (K/uL)    Lymphocytes Relative 8 (*) 12 - 46 (%)    Lymphs Abs 1.0  0.7 - 4.0 (K/uL)    Monocytes Relative 9  3 - 12 (%)    Monocytes Absolute 1.1 (*) 0.1 - 1.0 (K/uL)    Eosinophils Relative 0  0 - 5 (%)    Eosinophils Absolute 0.0  0.0 - 0.7 (K/uL)    Basophils Relative 0  0 - 1 (%)    Basophils Absolute 0.0  0.0 - 0.1 (K/uL)   COMPREHENSIVE METABOLIC PANEL     Status: Abnormal   Collection Time   05/22/11 10:15 AM      Component Value Range Comment   Sodium 135  135 - 145 (mEq/L)    Potassium 2.6 (*) 3.5 - 5.1 (mEq/L)    Chloride 91 (*) 96 - 112 (mEq/L)    CO2 33 (*) 19 - 32 (mEq/L)    Glucose, Bld 260 (*) 70 - 99 (mg/dL)    BUN 17  6 - 23 (mg/dL)    Creatinine, Ser 0.92  0.50 - 1.10 (mg/dL)    Calcium 9.7  8.4 - 10.5 (mg/dL)    Total Protein 7.1  6.0 - 8.3 (g/dL)    Albumin 2.9 (*) 3.5 - 5.2 (g/dL)    AST 26  0 - 37 (U/L)    ALT 21  0 - 35 (U/L)    Alkaline Phosphatase 74  39 - 117 (U/L)    Total Bilirubin 0.5  0.3 - 1.2 (mg/dL)    GFR calc non Af Amer 62 (*) >90 (mL/min)    GFR calc Af Amer 72 (*) >90 (mL/min)   GLUCOSE, CAPILLARY     Status: Abnormal   Collection Time   05/22/11 10:41 AM      Component Value Range Comment   Glucose-Capillary 217 (*) 70 - 99 (mg/dL)   URINALYSIS, ROUTINE  W REFLEX MICROSCOPIC     Status: Abnormal   Collection Time   05/22/11 10:56 AM      Component Value Range Comment   Color, Urine YELLOW  YELLOW     APPearance CLOUDY (*) CLEAR     Specific Gravity, Urine 1.018  1.005 - 1.030     pH 5.5  5.0 - 8.0     Glucose, UA >1000 (*) NEGATIVE (mg/dL)    Hgb urine dipstick SMALL (*) NEGATIVE     Bilirubin Urine NEGATIVE  NEGATIVE     Ketones, ur NEGATIVE   NEGATIVE (mg/dL)    Protein, ur 100 (*) NEGATIVE (mg/dL)    Urobilinogen, UA 0.2  0.0 - 1.0 (mg/dL)    Nitrite NEGATIVE  NEGATIVE     Leukocytes, UA MODERATE (*) NEGATIVE    URINE MICROSCOPIC-ADD ON     Status: Abnormal   Collection Time   05/22/11 10:56 AM      Component Value Range Comment   Squamous Epithelial / LPF RARE  RARE     WBC, UA TOO NUMEROUS TO COUNT  <3 (WBC/hpf)    RBC / HPF 0-2  <3 (RBC/hpf)    Bacteria, UA MANY (*) RARE     Urine-Other MUCOUS PRESENT   AMORPHOUS URATES/PHOSPHATES  URINE CULTURE     Status: Normal (Preliminary result)   Collection Time   05/22/11 10:56 AM      Component Value Range Comment   Specimen Description URINE, CATHETERIZED      Special Requests NONE      Culture  Setup Time [id number]      Colony Count 45,000 COLONIES/ML      Culture ESCHERICHIA COLI      Report Status PENDING     LACTIC ACID, PLASMA     Status: Normal   Collection Time   05/22/11 12:02 PM      Component Value Range Comment   Lactic Acid, Venous 1.6  0.5 - 2.2 (mmol/L)   PROTIME-INR     Status: Abnormal   Collection Time   05/22/11  2:04 PM      Component Value Range Comment   Prothrombin Time 19.9 (*) 11.6 - 15.2 (seconds)    INR 1.66 (*) 0.00 - 1.49    GLUCOSE, CAPILLARY     Status: Normal   Collection Time   05/22/11  3:36 PM      Component Value Range Comment   Glucose-Capillary 98  70 - 99 (mg/dL)    Comment 1 Notify RN      Comment 2 Documented in Chart     GLUCOSE, CAPILLARY     Status: Normal   Collection Time   05/22/11  6:44 PM      Component Value Range Comment   Glucose-Capillary 95  70 - 99 (mg/dL)   GLUCOSE, CAPILLARY     Status: Abnormal   Collection Time   05/22/11  9:56 PM      Component Value Range Comment   Glucose-Capillary 101 (*) 70 - 99 (mg/dL)    Comment 1 Notify RN      Comment 2 Documented in Chart     BASIC METABOLIC PANEL     Status: Abnormal   Collection Time   05/23/11  6:45 AM      Component Value Range Comment   Sodium 134  (*) 135 - 145 (mEq/L)    Potassium 4.1  3.5 - 5.1 (mEq/L) SLIGHT HEMOLYSIS   Chloride 92 (*) 96 - 112 (  mEq/L)    CO2 32  19 - 32 (mEq/L)    Glucose, Bld 169 (*) 70 - 99 (mg/dL)    BUN 15  6 - 23 (mg/dL)    Creatinine, Ser 0.70  0.50 - 1.10 (mg/dL)    Calcium 8.5  8.4 - 10.5 (mg/dL)    GFR calc non Af Amer 86 (*) >90 (mL/min)    GFR calc Af Amer >90  >90 (mL/min)   CBC     Status: Abnormal   Collection Time   05/23/11  6:45 AM      Component Value Range Comment   WBC 14.1 (*) 4.0 - 10.5 (K/uL)    RBC 4.68  3.87 - 5.11 (MIL/uL)    Hemoglobin 13.6  12.0 - 15.0 (g/dL)    HCT 40.6  36.0 - 46.0 (%)    MCV 86.8  78.0 - 100.0 (fL)    MCH 29.1  26.0 - 34.0 (pg)    MCHC 33.5  30.0 - 36.0 (g/dL)    RDW 14.4  11.5 - 15.5 (%)    Platelets 342  150 - 400 (K/uL)   PROTIME-INR     Status: Abnormal   Collection Time   05/23/11  6:45 AM      Component Value Range Comment   Prothrombin Time 23.7 (*) 11.6 - 15.2 (seconds)    INR 2.07 (*) 0.00 - 1.49    GLUCOSE, CAPILLARY     Status: Abnormal   Collection Time   05/23/11  8:47 AM      Component Value Range Comment   Glucose-Capillary 188 (*) 70 - 99 (mg/dL)   GLUCOSE, CAPILLARY     Status: Abnormal   Collection Time   05/23/11 11:49 AM      Component Value Range Comment   Glucose-Capillary 214 (*) 70 - 99 (mg/dL)     MICRO: Urine culture - E. Coli 3/18 pansensitive, 3/22 E. Coli, sensi pending IMAGING: No results found.  HISTORICAL MICRO/IMAGING  Assessment/Plan:  71 yo with delirium.  I still think encephalitis is less likely with this clinical picture because she is more delirious and the course has been waxing and waning.  The most likely cause of delirium for her is the UTI.  - I would however r/o a subacute encephalitis because of the MRI findings Start acyclovir following lp while awaiting results if the CSF is found to have a significant amount of WBCs.    Scharlene Gloss, MD

## 2011-05-23 NOTE — Consult Note (Signed)
ANTICOAGULATION CONSULT NOTE - Initial Consult  Pharmacy Consult for Coumadin Indication: atrial fibrillation  Allergies  Allergen Reactions  . Procaine Hcl Anaphylaxis   Vital Signs: Temp: 99.2 F (37.3 C) (03/23 0500) Temp src: Oral (03/23 0500) BP: 185/80 mmHg (03/23 0500) Pulse Rate: 73  (03/23 0500)  Labs:  Basename 05/23/11 0645 05/22/11 1404 05/22/11 1015  HGB 13.6 -- 13.4  HCT 40.6 -- 40.7  PLT 342 -- 363  APTT -- -- --  LABPROT 23.7* 19.9* --  INR 2.07* 1.66* --  HEPARINUNFRC -- -- --  CREATININE 0.70 -- 0.92  CKTOTAL -- -- --  CKMB -- -- --  TROPONINI -- -- --   Estimated Creatinine Clearance: 71.9 ml/min (by C-G formula based on Cr of 0.7).  Medical History: Past Medical History  Diagnosis Date  . Blindness of left eye   . Eye muscle weakness     Right eye weakness after cataract surgery  . Common migraine     ?hx of  . DIABETES MELLITUS, TYPE II 03/27/2006  . HYPERLIPIDEMIA 03/27/2006  . 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 HYPERTENSION 03/27/2006  . Atrial fibrillation     SVT dx 2007, cath 2007 mild CAD, then had an cardioversion, ablation; still on coumadin , has occ palpitation, EKG 03-2010 NSR  . Headache 05/07/2009  . LUNG NODULE 09/01/2006    Excision, Bx Benighn  . Osteopenia 2004    Dexa 2004 showed Osteopenia, DEXA 03/2007 normal   Medications:  Coumadin 2.5mg  daily except 5mg  on Wednesday and Friday.  Assessment: 70yof recently discharged from the hospital on 3/19 returns with AMS and vomiting.  She is on coumadin pta for afib and will continue this. INR today 2.07.  Goal of Therapy:  INR 2-3   Plan:  1) Coumadin 2.5mg  x 1 2) Daily INR  [name] [name] 05/23/2011,10:30 AM

## 2011-05-23 NOTE — Progress Notes (Signed)
[facility]  INITIAL NOTE  Pharmacy Consult for: Acyclovir  Indication: R/o encephalitis   Patient Data:   Allergies: Allergies  Allergen Reactions  . Procaine Hcl Anaphylaxis    Patient Measurements: Height: 5\' 2"  (157.5 cm) Weight: 212 lb 9.6 oz (96.435 kg) IBW/kg (Calculated) : 50.1    Vital Signs: Temp:  [98.2 F (36.8 C)-99.2 F (37.3 C)] 98.3 F (36.8 C) (03/23 2029) Pulse Rate:  [73-108] 82  (03/23 2029) Resp:  [18-20] 18  (03/23 2029) BP: (107-194)/(71-81) 194/81 mmHg (03/23 2029) SpO2:  [92 %-100 %] 94 % (03/23 2029)  Intake/Output from previous day:  Intake/Output Summary (Last 24 hours) at 05/23/11 2335 Last data filed at 05/23/11 1900  Gross per 24 hour  Intake 1212.25 ml  Output   1800 ml  Net -587.75 ml    Labs:  Basename 05/23/11 0645 05/22/11 1015  WBC 14.1* 12.7*  HGB 13.6 13.4  PLT 342 363  LABCREA -- --  CREATININE 0.70 0.92   Estimated Creatinine Clearance: 71.9 ml/min (by C-G formula based on Cr of 0.7). No results found for this basename: VANCOTROUGH:2,VANCOPEAK:2,VANCORANDOM:2,GENTTROUGH:2,GENTPEAK:2,GENTRANDOM:2,TOBRATROUGH:2,TOBRAPEAK:2,TOBRARND:2,AMIKACINPEAK:2,AMIKACINTROU:2,AMIKACIN:2, in the last 72 hours   Microbiology: Recent Results (from the past 720 hour(s))  CULTURE, BLOOD (ROUTINE X 2)     Status: Normal (Preliminary result)   Collection Time   05/17/11  7:33 PM      Component Value Range Status Comment   Specimen Description BLOOD LEFT ARM   Final    Special Requests     Final    Value: BOTTLES DRAWN AEROBIC AND ANAEROBIC 10CC AEROBIC 8CC ANAEROBIC   Culture  Setup Time [id number]   Final    Culture     Final    Value:        BLOOD CULTURE RECEIVED NO GROWTH TO DATE CULTURE WILL BE HELD FOR 5 DAYS BEFORE ISSUING A FINAL NEGATIVE REPORT   Report Status PENDING   Incomplete   CULTURE, BLOOD (ROUTINE X 2)     Status: Normal (Preliminary result)   Collection Time   05/17/11  7:38 PM      Component  Value Range Status Comment   Specimen Description BLOOD LEFT ARM   Final    Special Requests     Final    Value: BOTTLES DRAWN AEROBIC AND ANAEROBIC 10CC AEROBIC 8CC ANAEROBIC   Culture  Setup Time [id number]   Final    Culture     Final    Value:        BLOOD CULTURE RECEIVED NO GROWTH TO DATE CULTURE WILL BE HELD FOR 5 DAYS BEFORE ISSUING A FINAL NEGATIVE REPORT   Report Status PENDING   Incomplete   URINE CULTURE     Status: Normal   Collection Time   05/18/11  3:02 AM      Component Value Range Status Comment   Specimen Description URINE, RANDOM   Final    Special Requests NONE   Final    Culture  Setup Time [id number]   Final    Colony Count >=100,000 COLONIES/ML   Final    Culture ESCHERICHIA COLI   Final    Report Status 05/20/2011 FINAL   Final    Organism ID, Bacteria ESCHERICHIA COLI   Final   URINE CULTURE     Status: Normal (Preliminary result)   Collection Time   05/22/11 10:56 AM      Component Value Range Status Comment   Specimen Description URINE, CATHETERIZED   Final  Special Requests NONE   Final    Culture  Setup Time [id number]   Final    Colony Count 45,000 COLONIES/ML   Final    Culture ESCHERICHIA COLI   Final    Report Status PENDING   Incomplete     Medical History: Past Medical History  Diagnosis Date  . Blindness of left eye   . Eye muscle weakness     Right eye weakness after cataract surgery  . Common migraine     ?hx of  . DIABETES MELLITUS, TYPE II 03/27/2006  . HYPERLIPIDEMIA 03/27/2006  . 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 HYPERTENSION 03/27/2006  . Atrial fibrillation     SVT dx 2007, cath 2007 mild CAD, then had an cardioversion, ablation; still on coumadin , has occ palpitation, EKG 03-2010 NSR  . Headache 05/07/2009  . LUNG NODULE 09/01/2006    Excision, Bx Benighn  . Osteopenia 2004    Dexa 2004 showed Osteopenia, DEXA 03/2007 normal    Scheduled Medications:     . sodium chloride   Intravenous STAT  . aspirin EC  81 mg  Oral Daily  . atorvastatin  10 mg Oral q1800  . calcium-vitamin D  1 tablet Oral Q breakfast  . cefTRIAXone (ROCEPHIN)  IV  1 g Intravenous Q24H  . diltiazem  120 mg Oral Daily  . docusate sodium  100 mg Oral BID  . insulin aspart  0-9 Units Subcutaneous Q4H  . LORazepam      . LORazepam  1 mg Intravenous Once  . LORazepam  1 mg Intravenous Once  . LORazepam  1 mg Intravenous Once  . LORazepam  1 mg Intravenous Once  . metoprolol  2.5 mg Intravenous Q12H  . mulitivitamin with minerals  1 tablet Oral Daily  . phenytoin (DILANTIN) IV  1,000 mg Intravenous STAT  . phenytoin (DILANTIN) IV  100 mg Intravenous Q8H  . potassium chloride  40 mEq Oral TID  . simvastatin  20 mg Oral QHS  . DISCONTD: hydrALAZINE  5 mg Intravenous Once  . DISCONTD: insulin aspart  0-9 Units Subcutaneous TID WC  . DISCONTD: insulin glargine  5 Units Subcutaneous QHS  . DISCONTD: metoprolol tartrate  12.5 mg Oral BID  . DISCONTD: phytonadione  5 mg Intramuscular STAT  . DISCONTD: warfarin  2.5 mg Oral ONCE-1800  . DISCONTD: Warfarin - Pharmacist Dosing Inpatient   Does not apply q1800      Assessment:  71 y.o. female admitted on 05/22/2011, with r/o encephalitis. Pharmacy consulted to manage acyclovir.  Plan:  1. Acyclovir 500mg  IV Q8H.  2.   Follow renal function / LP results.    Monia Sabal Doy Mince, PharmD 05/23/2011, 11:35 PM

## 2011-05-23 NOTE — Plan of Care (Signed)
Problem: Phase I Progression Outcomes Goal: OOB as tolerated unless otherwise ordered Outcome: Completed/Met Date Met:  05/23/11 OOB as tolerated, ambulating to the bathroom with 1 assist. Goal: Initial discharge plan identified Outcome: Completed/Met Date Met:  05/23/11 Pt to return home with family when medically cleared. Goal: Other Phase I Outcomes/Goals Outcome: Completed/Met Date Met:  05/23/11 Coumadin on hold for LP, awaiting INR decrease.

## 2011-05-24 ENCOUNTER — Other Ambulatory Visit: Payer: Self-pay

## 2011-05-24 ENCOUNTER — Encounter (HOSPITAL_COMMUNITY): Payer: Self-pay | Admitting: Neurology

## 2011-05-24 ENCOUNTER — Inpatient Hospital Stay (HOSPITAL_COMMUNITY): Payer: Medicare Other

## 2011-05-24 DIAGNOSIS — J9601 Acute respiratory failure with hypoxia: Secondary | ICD-10-CM | POA: Diagnosis not present

## 2011-05-24 DIAGNOSIS — J9600 Acute respiratory failure, unspecified whether with hypoxia or hypercapnia: Secondary | ICD-10-CM

## 2011-05-24 DIAGNOSIS — R569 Unspecified convulsions: Secondary | ICD-10-CM

## 2011-05-24 DIAGNOSIS — N390 Urinary tract infection, site not specified: Secondary | ICD-10-CM

## 2011-05-24 DIAGNOSIS — R6520 Severe sepsis without septic shock: Secondary | ICD-10-CM | POA: Diagnosis not present

## 2011-05-24 DIAGNOSIS — A498 Other bacterial infections of unspecified site: Secondary | ICD-10-CM

## 2011-05-24 DIAGNOSIS — A419 Sepsis, unspecified organism: Secondary | ICD-10-CM | POA: Diagnosis not present

## 2011-05-24 DIAGNOSIS — I4891 Unspecified atrial fibrillation: Secondary | ICD-10-CM

## 2011-05-24 DIAGNOSIS — G40909 Epilepsy, unspecified, not intractable, without status epilepticus: Secondary | ICD-10-CM | POA: Diagnosis not present

## 2011-05-24 LAB — BLOOD GAS, ARTERIAL
Drawn by: 13898
PEEP: 5 cmH2O
RATE: 15 resp/min
pCO2 arterial: 38.6 mmHg (ref 35.0–45.0)
pH, Arterial: 7.506 — ABNORMAL HIGH (ref 7.350–7.400)
pO2, Arterial: 154 mmHg — ABNORMAL HIGH (ref 80.0–100.0)

## 2011-05-24 LAB — URINE CULTURE
Colony Count: 45000
Culture  Setup Time: 201303221328

## 2011-05-24 LAB — CULTURE, BLOOD (ROUTINE X 2): Culture  Setup Time: 201303180148

## 2011-05-24 LAB — BASIC METABOLIC PANEL
BUN: 21 mg/dL (ref 6–23)
Chloride: 93 mEq/L — ABNORMAL LOW (ref 96–112)
Creatinine, Ser: 0.97 mg/dL (ref 0.50–1.10)
Glucose, Bld: 338 mg/dL — ABNORMAL HIGH (ref 70–99)
Potassium: 3.4 mEq/L — ABNORMAL LOW (ref 3.5–5.1)

## 2011-05-24 LAB — CBC
HCT: 37.9 % (ref 36.0–46.0)
Hemoglobin: 12.4 g/dL (ref 12.0–15.0)
MCH: 28.5 pg (ref 26.0–34.0)
MCHC: 32.7 g/dL (ref 30.0–36.0)
MCV: 87.1 fL (ref 78.0–100.0)
RDW: 14.4 % (ref 11.5–15.5)

## 2011-05-24 LAB — GLUCOSE, CAPILLARY
Glucose-Capillary: 230 mg/dL — ABNORMAL HIGH (ref 70–99)
Glucose-Capillary: 134 mg/dL — ABNORMAL HIGH (ref 70–99)

## 2011-05-24 LAB — PHENYTOIN LEVEL, TOTAL: Phenytoin Lvl: 6.8 ug/mL — ABNORMAL LOW (ref 10.0–20.0)

## 2011-05-24 MED ORDER — ROCURONIUM BROMIDE 50 MG/5ML IV SOLN
60.0000 mg | Freq: Once | INTRAVENOUS | Status: AC
  Administered 2011-05-24: 60 mg via INTRAVENOUS
  Filled 2011-05-24: qty 6

## 2011-05-24 MED ORDER — BIOTENE DRY MOUTH MT LIQD
15.0000 mL | Freq: Four times a day (QID) | OROMUCOSAL | Status: DC
  Administered 2011-05-24 – 2011-05-28 (×13): 15 mL via OROMUCOSAL

## 2011-05-24 MED ORDER — LORAZEPAM 2 MG/ML IJ SOLN
2.0000 mg | INTRAMUSCULAR | Status: DC | PRN
  Administered 2011-05-24: 2 mg via INTRAVENOUS

## 2011-05-24 MED ORDER — AMIODARONE LOAD VIA INFUSION
150.0000 mg | Freq: Once | INTRAVENOUS | Status: AC
  Administered 2011-05-24: 150 mg via INTRAVENOUS
  Filled 2011-05-24: qty 83.34

## 2011-05-24 MED ORDER — INSULIN ASPART 100 UNIT/ML ~~LOC~~ SOLN: 0.0000 [IU] | SUBCUTANEOUS | Status: DC

## 2011-05-24 MED ORDER — PANTOPRAZOLE SODIUM 40 MG IV SOLR
40.0000 mg | Freq: Every day | INTRAVENOUS | Status: DC
  Administered 2011-05-24 – 2011-05-25 (×2): 40 mg via INTRAVENOUS
  Filled 2011-05-24 (×2): qty 40

## 2011-05-24 MED ORDER — AMIODARONE HCL IN DEXTROSE 360-4.14 MG/200ML-% IV SOLN
60.0000 mg/h | INTRAVENOUS | Status: AC
  Administered 2011-05-24: 60 mg/h via INTRAVENOUS
  Filled 2011-05-24 (×3): qty 200

## 2011-05-24 MED ORDER — SODIUM CHLORIDE 0.9 % IV SOLN
INTRAVENOUS | Status: DC
  Administered 2011-05-25: 1.9 [IU]/h via INTRAVENOUS
  Filled 2011-05-24 (×2): qty 1

## 2011-05-24 MED ORDER — SODIUM CHLORIDE 0.9 % IV BOLUS (SEPSIS)
1000.0000 mL | Freq: Once | INTRAVENOUS | Status: AC
  Administered 2011-05-24: 1000 mL via INTRAVENOUS

## 2011-05-24 MED ORDER — VITAMIN K1 10 MG/ML IJ SOLN
5.0000 mg | INTRAVENOUS | Status: AC
  Administered 2011-05-24: 5 mg via INTRAVENOUS
  Filled 2011-05-24 (×2): qty 0.5

## 2011-05-24 MED ORDER — DEXTROSE 10 % IV SOLN: INTRAVENOUS | Status: DC

## 2011-05-24 MED ORDER — PHENYTOIN SODIUM 50 MG/ML IJ SOLN
100.0000 mg | Freq: Three times a day (TID) | INTRAMUSCULAR | Status: DC
  Administered 2011-05-24 – 2011-05-26 (×6): 100 mg via INTRAVENOUS
  Filled 2011-05-24 (×11): qty 2

## 2011-05-24 MED ORDER — AMIODARONE HCL IN DEXTROSE 360-4.14 MG/200ML-% IV SOLN
30.0000 mg/h | INTRAVENOUS | Status: DC
  Administered 2011-05-25 (×2): 30 mg/h via INTRAVENOUS
  Filled 2011-05-24 (×5): qty 200

## 2011-05-24 MED ORDER — INSULIN ASPART 100 UNIT/ML ~~LOC~~ SOLN
0.0000 [IU] | SUBCUTANEOUS | Status: DC
  Administered 2011-05-24: 2 [IU] via SUBCUTANEOUS
  Administered 2011-05-24: 7 [IU] via SUBCUTANEOUS
  Administered 2011-05-24: 4 [IU] via SUBCUTANEOUS
  Administered 2011-05-25 (×2): 2 [IU] via SUBCUTANEOUS

## 2011-05-24 MED ORDER — CHLORHEXIDINE GLUCONATE 0.12 % MT SOLN
OROMUCOSAL | Status: AC
  Administered 2011-05-24: 15 mL via OROMUCOSAL
  Filled 2011-05-24: qty 15

## 2011-05-24 MED ORDER — LORAZEPAM 2 MG/ML IJ SOLN
2.0000 mg | Freq: Once | INTRAMUSCULAR | Status: AC
  Administered 2011-05-24: 2 mg via INTRAVENOUS

## 2011-05-24 MED ORDER — SODIUM CHLORIDE 0.9 % IV SOLN
0.4000 ug/kg/h | INTRAVENOUS | Status: DC
  Administered 2011-05-24 – 2011-05-25 (×3): 0.4 ug/kg/h via INTRAVENOUS
  Administered 2011-05-25 – 2011-05-26 (×2): 0.5 ug/kg/h via INTRAVENOUS
  Filled 2011-05-24 (×7): qty 2

## 2011-05-24 MED ORDER — CHLORHEXIDINE GLUCONATE 0.12 % MT SOLN
15.0000 mL | Freq: Two times a day (BID) | OROMUCOSAL | Status: DC
  Administered 2011-05-24 – 2011-05-27 (×7): 15 mL via OROMUCOSAL
  Filled 2011-05-24 (×7): qty 15

## 2011-05-24 MED ORDER — PROPOFOL 10 MG/ML IV EMUL
INTRAVENOUS | Status: AC
  Filled 2011-05-24: qty 100

## 2011-05-24 MED ORDER — FENTANYL CITRATE 0.05 MG/ML IJ SOLN
INTRAMUSCULAR | Status: AC
  Administered 2011-05-24: 50 ug
  Filled 2011-05-24: qty 2

## 2011-05-24 NOTE — Consult Note (Signed)
[facility] CONSULT [facility]     Reason for Consult: Altered mental status, right focal seizure activity and abnormal MRI with left temporal lobe lesion.   HPI:    [name] [name] is an 71 y.o. female who was previously admitted to [facility] on 05/16/2011 altered mental status as well as abnormal MRI study with evidence of abnormal lesion involving the mid posterior temporal region on the left which enhanced with contrast media. 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 She was treated with antibiotics for urinary tract infection and was also given acyclovir for 2 days. Antiviral and antibiotics were discontinued as the patient improved remarkably. She was discharged on 05/19/2011. Patient has a history of atrial fibrillation and has been on Coumadin. INR was therapeutic. Lumbar puncture was deferred since the patient improved remarkably. She was brought back to the emergency room by family members on 05/22/2011 falls and mental status. She subsequently developed witnessed focal right motor seizure activity. She also had apneic spells which were prolonged. She was intubated for respiratory management. She was also loaded with Dilantin for seizure management. Acyclovir was started empirically. Anticoagulation is being reversed in order to obtain lumbar puncture. Neurology was not consulted during the previous recent hospitalization.  Past Medical History  Diagnosis Date  . Blindness of left eye   . Eye muscle weakness     Right eye weakness after cataract surgery  . Common migraine     ?hx of  . DIABETES MELLITUS, TYPE II 03/27/2006  . HYPERLIPIDEMIA 03/27/2006  . 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 HYPERTENSION 03/27/2006  . Atrial fibrillation     SVT dx 2007, cath 2007 mild CAD, then had an cardioversion, ablation; still on coumadin , has occ palpitation, EKG 03-2010 NSR  . Headache 05/07/2009  .  LUNG NODULE 09/01/2006    Excision, Bx [facility]  . Osteopenia 2004    Dexa 2004 showed Osteopenia, DEXA 03/2007 normal    History reviewed. No pertinent past surgical history.  Family History  Problem Relation Age of Onset  . Diabetes Father   . Heart attack Father 28  . Diabetes Sister   . Cancer Neg Hx     no hx of colon or breast cancer    Social History:  reports that she quit smoking about 33 years ago. She does not have any smokeless tobacco history on file. She reports that she does not drink alcohol or use illicit drugs.  Allergies  Allergen Reactions  . Procaine Hcl Anaphylaxis    Medications:    Scheduled:   . acyclovir  10 mg/kg (Ideal) Intravenous Q8H  . aspirin EC  81 mg Oral Daily  . atorvastatin  10 mg Oral q1800  . calcium-vitamin D  1 tablet Oral Q breakfast  . cefTRIAXone (ROCEPHIN)  IV  1 g Intravenous Q24H  . diltiazem  120 mg Oral Daily  . docusate sodium  100 mg Oral BID  . fentaNYL      . insulin aspart  0-9 Units Subcutaneous Q4H  . LORazepam      . LORazepam  1 mg Intravenous Once  . LORazepam  1 mg Intravenous Once  . LORazepam  1 mg Intravenous Once  . LORazepam  1 mg Intravenous Once  . metoprolol  2.5 mg Intravenous Q12H  . mulitivitamin with minerals  1  tablet Oral Daily  . phenytoin (DILANTIN) IV  1,000 mg Intravenous STAT  . phenytoin (DILANTIN) IV  100 mg Intravenous Q8H  . phytonadione (VITAMIN K) IV  5 mg Intravenous STAT  . potassium chloride  40 mEq Oral TID  . propofol      . simvastatin  20 mg Oral QHS  . DISCONTD: hydrALAZINE  5 mg Intravenous Once  . DISCONTD: insulin aspart  0-9 Units Subcutaneous TID WC  . DISCONTD: insulin glargine  5 Units Subcutaneous QHS  . DISCONTD: metoprolol tartrate  12.5 mg Oral BID  . DISCONTD: phytonadione  5 mg Intramuscular STAT  . DISCONTD: warfarin  2.5 mg Oral ONCE-1800  . DISCONTD: Warfarin - Pharmacist Dosing Inpatient   Does not apply q1800   Blood pressure 134/47, pulse 82,  temperature 98.3 F (36.8 C), temperature source Oral, resp. rate 18, height 5\' 2"  (1.575 m), weight 96.435 kg (212 lb 9.6 oz), SpO2 94.00%.   Neurologic Examination:  Patient was intubated and on mechanical ventilation. She was sedated with propofol intravenously. No seizure activity was noted. Pupils were equal and right pupil reacted to light. There was only equivocal reaction of the left pupil to light. Extraocular movements were absolute with oculocephalic maneuvers. Face was symmetrical with no indication of focal weakness. Muscle tone was flaccid throughout. She had no spontaneous movements of her extremities and no abnormal posturing. She also had no withdrawal movements of extremities to noxious stimuli. Deep tendon reflexes were absent throughout. Plantar responses were mute bilaterally.  Lab Results  Component Value Date/Time   CHOL 156 09/29/2010 12:07 PM    Results for orders placed during the hospital encounter of 05/22/11 (from the past 48 hour(s))  AMMONIA     Status: Normal   Collection Time   05/22/11  9:49 AM      Component Value Range Comment   Ammonia 39  11 - 60 (umol/L)   CBC     Status: Abnormal   Collection Time   05/22/11 10:15 AM      Component Value Range Comment   WBC 12.7 (*) 4.0 - 10.5 (K/uL)    RBC 4.71  3.87 - 5.11 (MIL/uL)    Hemoglobin 13.4  12.0 - 15.0 (g/dL)    HCT 40.7  36.0 - 46.0 (%)    MCV 86.4  78.0 - 100.0 (fL)    MCH 28.5  26.0 - 34.0 (pg)    MCHC 32.9  30.0 - 36.0 (g/dL)    RDW 14.1  11.5 - 15.5 (%)    Platelets 363  150 - 400 (K/uL)   DIFFERENTIAL     Status: Abnormal   Collection Time   05/22/11 10:15 AM      Component Value Range Comment   Neutrophils Relative 83 (*) 43 - 77 (%)    Neutro Abs 10.6 (*) 1.7 - 7.7 (K/uL)    Lymphocytes Relative 8 (*) 12 - 46 (%)    Lymphs Abs 1.0  0.7 - 4.0 (K/uL)    Monocytes Relative 9  3 - 12 (%)    Monocytes Absolute 1.1 (*) 0.1 - 1.0 (K/uL)    Eosinophils Relative 0  0 - 5 (%)    Eosinophils  Absolute 0.0  0.0 - 0.7 (K/uL)    Basophils Relative 0  0 - 1 (%)    Basophils Absolute 0.0  0.0 - 0.1 (K/uL)   COMPREHENSIVE METABOLIC PANEL     Status: Abnormal   Collection Time   05/22/11  10:15 AM      Component Value Range Comment   Sodium 135  135 - 145 (mEq/L)    Potassium 2.6 (*) 3.5 - 5.1 (mEq/L)    Chloride 91 (*) 96 - 112 (mEq/L)    CO2 33 (*) 19 - 32 (mEq/L)    Glucose, Bld 260 (*) 70 - 99 (mg/dL)    BUN 17  6 - 23 (mg/dL)    Creatinine, Ser 0.92  0.50 - 1.10 (mg/dL)    Calcium 9.7  8.4 - 10.5 (mg/dL)    Total Protein 7.1  6.0 - 8.3 (g/dL)    Albumin 2.9 (*) 3.5 - 5.2 (g/dL)    AST 26  0 - 37 (U/L)    ALT 21  0 - 35 (U/L)    Alkaline Phosphatase 74  39 - 117 (U/L)    Total Bilirubin 0.5  0.3 - 1.2 (mg/dL)    GFR calc non Af Amer 62 (*) >90 (mL/min)    GFR calc Af Amer 72 (*) >90 (mL/min)   GLUCOSE, CAPILLARY     Status: Abnormal   Collection Time   05/22/11 10:41 AM      Component Value Range Comment   Glucose-Capillary 217 (*) 70 - 99 (mg/dL)   URINALYSIS, ROUTINE W REFLEX MICROSCOPIC     Status: Abnormal   Collection Time   05/22/11 10:56 AM      Component Value Range Comment   Color, Urine YELLOW  YELLOW     APPearance CLOUDY (*) CLEAR     Specific Gravity, Urine 1.018  1.005 - 1.030     pH 5.5  5.0 - 8.0     Glucose, UA >1000 (*) NEGATIVE (mg/dL)    Hgb urine dipstick SMALL (*) NEGATIVE     Bilirubin Urine NEGATIVE  NEGATIVE     Ketones, ur NEGATIVE  NEGATIVE (mg/dL)    Protein, ur 100 (*) NEGATIVE (mg/dL)    Urobilinogen, UA 0.2  0.0 - 1.0 (mg/dL)    Nitrite NEGATIVE  NEGATIVE     Leukocytes, UA MODERATE (*) NEGATIVE    URINE MICROSCOPIC-ADD ON     Status: Abnormal   Collection Time   05/22/11 10:56 AM      Component Value Range Comment   Squamous Epithelial / LPF RARE  RARE     WBC, UA TOO NUMEROUS TO COUNT  <3 (WBC/hpf)    RBC / HPF 0-2  <3 (RBC/hpf)    Bacteria, UA MANY (*) RARE     Urine-Other MUCOUS PRESENT   AMORPHOUS URATES/PHOSPHATES  URINE  CULTURE     Status: Normal (Preliminary result)   Collection Time   05/22/11 10:56 AM      Component Value Range Comment   Specimen Description URINE, CATHETERIZED      Special Requests NONE      Culture  Setup Time [id number]      Colony Count 45,000 COLONIES/ML      Culture ESCHERICHIA COLI      Report Status PENDING     LACTIC ACID, PLASMA     Status: Normal   Collection Time   05/22/11 12:02 PM      Component Value Range Comment   Lactic Acid, Venous 1.6  0.5 - 2.2 (mmol/L)   PROTIME-INR     Status: Abnormal   Collection Time   05/22/11  2:04 PM      Component Value Range Comment   Prothrombin Time 19.9 (*) 11.6 - 15.2 (seconds)  INR 1.66 (*) 0.00 - 1.49    GLUCOSE, CAPILLARY     Status: Normal   Collection Time   05/22/11  3:36 PM      Component Value Range Comment   Glucose-Capillary 98  70 - 99 (mg/dL)    Comment 1 Notify RN      Comment 2 Documented in Chart     GLUCOSE, CAPILLARY     Status: Normal   Collection Time   05/22/11  6:44 PM      Component Value Range Comment   Glucose-Capillary 95  70 - 99 (mg/dL)   GLUCOSE, CAPILLARY     Status: Abnormal   Collection Time   05/22/11  9:56 PM      Component Value Range Comment   Glucose-Capillary 101 (*) 70 - 99 (mg/dL)    Comment 1 Notify RN      Comment 2 Documented in Chart     BASIC METABOLIC PANEL     Status: Abnormal   Collection Time   05/23/11  6:45 AM      Component Value Range Comment   Sodium 134 (*) 135 - 145 (mEq/L)    Potassium 4.1  3.5 - 5.1 (mEq/L) SLIGHT HEMOLYSIS   Chloride 92 (*) 96 - 112 (mEq/L)    CO2 32  19 - 32 (mEq/L)    Glucose, Bld 169 (*) 70 - 99 (mg/dL)    BUN 15  6 - 23 (mg/dL)    Creatinine, Ser 0.70  0.50 - 1.10 (mg/dL)    Calcium 8.5  8.4 - 10.5 (mg/dL)    GFR calc non Af Amer 86 (*) >90 (mL/min)    GFR calc Af Amer >90  >90 (mL/min)   CBC     Status: Abnormal   Collection Time   05/23/11  6:45 AM      Component Value Range Comment   WBC 14.1 (*) 4.0 - 10.5 (K/uL)    RBC 4.68   3.87 - 5.11 (MIL/uL)    Hemoglobin 13.6  12.0 - 15.0 (g/dL)    HCT 40.6  36.0 - 46.0 (%)    MCV 86.8  78.0 - 100.0 (fL)    MCH 29.1  26.0 - 34.0 (pg)    MCHC 33.5  30.0 - 36.0 (g/dL)    RDW 14.4  11.5 - 15.5 (%)    Platelets 342  150 - 400 (K/uL)   PROTIME-INR     Status: Abnormal   Collection Time   05/23/11  6:45 AM      Component Value Range Comment   Prothrombin Time 23.7 (*) 11.6 - 15.2 (seconds)    INR 2.07 (*) 0.00 - 1.49    GLUCOSE, CAPILLARY     Status: Abnormal   Collection Time   05/23/11  8:47 AM      Component Value Range Comment   Glucose-Capillary 188 (*) 70 - 99 (mg/dL)   GLUCOSE, CAPILLARY     Status: Abnormal   Collection Time   05/23/11 11:49 AM      Component Value Range Comment   Glucose-Capillary 214 (*) 70 - 99 (mg/dL)   GLUCOSE, CAPILLARY     Status: Abnormal   Collection Time   05/23/11  4:55 PM      Component Value Range Comment   Glucose-Capillary 249 (*) 70 - 99 (mg/dL)   GLUCOSE, CAPILLARY     Status: Abnormal   Collection Time   05/23/11  8:34 PM      Component Value  Range Comment   Glucose-Capillary 194 (*) 70 - 99 (mg/dL)   GLUCOSE, CAPILLARY     Status: Abnormal   Collection Time   05/24/11 12:51 AM      Component Value Range Comment   Glucose-Capillary 288 (*) 70 - 99 (mg/dL)     Assessment/Plan:  71 year old lady with altered mental status and right focal seizures with left medial temporal lesion is likely etiology. Herpes simplex encephalitis will need to be ruled out. As well low-grade primary brain tumor is also a diagnostic consideration.  Recommendations: 1. Continue Dilantin maintenance at 100 mg every 8 hours IV. 2. Dilantin level in the a.m., or stat if patient has a recurrent seizure in the interim. 3. Agree with reversing anticoagulation and obtaining lumbar puncture for routine CSF studies as well as HSV PCR. 4. Agree with antiviral management with IV acyclovir. 5. Repeat MRI without and with contrast for comparison with  previous study one week ago to rule out progression of left medial temporal lesion. 6. EEG on 05/25/2011.  Rush Farmer M.D. [facility] [phone]  05/24/2011, 1:29 AM

## 2011-05-24 NOTE — Procedures (Signed)
Intubation Procedure Note [name] [name] [id number] 1940/05/20  Procedure: Intubation Indications: Airway protection and maintenance  Procedure Details Consent: Unable to obtain consent because of altered level of consciousness. Time Out: Verified patient identification, verified procedure, site/side was marked, verified correct patient position, special equipment/implants available, medications/allergies/relevent history reviewed, required imaging and test results available.  Performed  Mac4  Patient premedicated with fentanyl and paralyzed with rocuronium.  Patient's false teeth were removed.  Using Macintosh size for laryngoscope endotracheal tube placed at 23 cm at the lip on the first attempt.  Evaluation Hemodynamic Status: BP stable throughout; O2 sats: stable throughout Patient's Current Condition: stable Complications: No apparent complications Patient did tolerate procedure well. Chest X-ray ordered to verify placement.  CXR: tube position acceptable.   [name] [name] 05/24/2011

## 2011-05-24 NOTE — Procedures (Signed)
LP Procedure Note:  Patient has been seen and examined.  Chart has been reviewed.  LP is being performed to rule out herpes encephalitis.  Procedure has been explained to patient/family including risks and benefits.  Consent has been signed by patient/family and witnessed. Coagulopathy was addressed.  Blood pressure 153/55, pulse 81, temperature 102 F (38.9 C), temperature source Axillary, resp. rate 25, height 5\' 8"  (1.727 m), weight 96.2 kg (212 lb 1.3 oz), SpO2 98.00%.  Current facility-administered medications:acetaminophen (TYLENOL) suppository 650 mg, 650 mg, Rectal, Q6H PRN, Sorin June Leap, MD;  acetaminophen (TYLENOL) tablet 650 mg, 650 mg, Oral, Q6H PRN, Sorin C Marye Round, MD, 650 mg at 05/24/11 1537;  acyclovir (ZOVIRAX) 500 mg in dextrose 5 % 100 mL IVPB, 10 mg/kg (Ideal), Intravenous, Q8H, Sorin C Laza, MD, 500 mg at 05/24/11 1429 antiseptic oral rinse (BIOTENE) solution 15 mL, 15 mL, Mouth Rinse, QID, Juanito Doom, MD;  aspirin EC tablet 81 mg, 81 mg, Oral, Daily, Sorin C Marye Round, MD, 81 mg at 05/24/11 1320;  atorvastatin (LIPITOR) tablet 10 mg, 10 mg, Oral, q1800, Sorin June Leap, MD;  calcium-vitamin D (OSCAL WITH D) 500-200 MG-UNIT per tablet 1 tablet, 1 tablet, Oral, Q breakfast, Sorin June Leap, MD, 1 tablet at 05/24/11 1319 cefTRIAXone (ROCEPHIN) 1 g in dextrose 5 % 50 mL IVPB, 1 g, Intravenous, Q24H, Sorin C Laza, MD, 1 g at 05/24/11 1113;  chlorhexidine (PERIDEX) 0.12 % solution 15 mL, 15 mL, Mouth Rinse, BID, Juanito Doom, MD;  chlorhexidine (PERIDEX) 0.12 % solution, , , , , 15 mL at 05/24/11 0834 dexmedetomidine (PRECEDEX) 200 mcg in sodium chloride 0.9 % 50 mL infusion, 0.4-1.2 mcg/kg/hr, Intravenous, Titrated, [name], MD, 0.3 mcg/kg/hr at 05/24/11 0700;  dextrose 10 % infusion, , Intravenous, Continuous, Juanito Doom, MD;  dextrose 5 %-0.9 % sodium chloride infusion, , Intravenous, Continuous, Dianne Dun, NP, Last Rate: 20 mL/hr at 05/23/11 2248 diltiazem  (DILACOR XR) 24 hr capsule 120 mg, 120 mg, Oral, Daily, Sorin C Laza, MD, 120 mg at 05/23/11 0916;  docusate sodium (COLACE) capsule 100 mg, 100 mg, Oral, BID, Sorin C Laza, MD, 100 mg at 05/24/11 1320;  fentaNYL (SUBLIMAZE) 0.05 MG/ML injection, , , , , 50 mcg at 05/24/11 0049;  hydrALAZINE (APRESOLINE) injection 10 mg, 10 mg, Intravenous, Q6H PRN, Dianne Dun, NP, 10 mg at 05/24/11 1139 insulin aspart (novoLOG) injection 0-7 Units, 0-7 Units, Subcutaneous, Q4H, Juanito Doom, MD, 4 Units at 05/24/11 1623;  insulin regular (NOVOLIN R,HUMULIN R) 1 Units/mL in sodium chloride 0.9 % 100 mL infusion, , Intravenous, Continuous, Juanito Doom, MD;  LORazepam (ATIVAN) 2 MG/ML injection, , , , ;  LORazepam (ATIVAN) injection 1 mg, 1 mg, Intravenous, Once, Dianne Dun, NP, 1 mg at 05/23/11 2307 LORazepam (ATIVAN) injection 1 mg, 1 mg, Intravenous, Once, Dianne Dun, NP, 1 mg at 05/23/11 2315;  LORazepam (ATIVAN) injection 1 mg, 1 mg, Intravenous, Once, Dianne Dun, NP;  LORazepam (ATIVAN) injection 1 mg, 1 mg, Intravenous, Once, Dianne Dun, NP, 1 mg at 05/23/11 2331;  LORazepam (ATIVAN) injection 2 mg, 2 mg, Intravenous, Q4H PRN, Dianne Dun, NP metoprolol (LOPRESSOR) injection 2.5 mg, 2.5 mg, Intravenous, Q12H, Dianne Dun, NP, 2.5 mg at 05/24/11 1039;  mulitivitamin with minerals tablet 1 tablet, 1 tablet, Oral, Daily, Sorin C Laza, MD, 1 tablet at 05/24/11 1319;  ondansetron (ZOFRAN) tablet 4 mg, 4 mg, Oral, Q6H PRN, Sorin June Leap, MD;  pantoprazole (  PROTONIX) injection 40 mg, 40 mg, Intravenous, Daily, [name], MD, 40 mg at 05/24/11 1039 phenytoin (DILANTIN) 1,000 mg in sodium chloride 0.9 % 250 mL IVPB, 1,000 mg, Intravenous, STAT, Theressa Millard, MD, 1,000 mg at 05/23/11 2339;  phenytoin (DILANTIN) injection 100 mg, 100 mg, Intravenous, Q8H, Dianne Dun, NP, 100 mg at 05/24/11 1429;  phytonadione  (VITAMIN K) 5 mg in dextrose 5 % 50 mL IVPB, 5 mg, Intravenous, STAT, Harvette Evonnie Dawes, MD, 5 mg at 05/24/11 0100;  propofol (DIPRIVAN) 10 MG/ML infusion, , , ,  rocuronium (ZEMURON) injection 60 mg, 60 mg, Intravenous, Once, [name], MD, 60 mg at 05/24/11 0200;  simvastatin (ZOCOR) tablet 20 mg, 20 mg, Oral, QHS, Sorin C Laza, MD, 20 mg at 05/22/11 2248;  DISCONTD: 0.9 %  sodium chloride infusion, , Intravenous, Continuous, Sorin June Leap, MD, Last Rate: 10 mL/hr at 05/23/11 0744;  DISCONTD: dextrose 10 % infusion, , Intravenous, Continuous, Juanito Doom, MD DISCONTD: insulin aspart (novoLOG) injection 0-7 Units, 0-7 Units, Subcutaneous, Q4H, Juanito Doom, MD;  DISCONTD: insulin aspart (novoLOG) injection 0-9 Units, 0-9 Units, Subcutaneous, TID WC, Sorin June Leap, MD, 3 Units at 05/23/11 1703;  DISCONTD: insulin aspart (novoLOG) injection 0-9 Units, 0-9 Units, Subcutaneous, Q4H, Dianne Dun, NP, 5 Units at 05/24/11 [id number] DISCONTD: insulin glargine (LANTUS) injection 5 Units, 5 Units, Subcutaneous, QHS, Sorin C Marye Round, MD, 5 Units at 05/22/11 2249;  DISCONTD: metoprolol tartrate (LOPRESSOR) tablet 12.5 mg, 12.5 mg, Oral, BID, Sorin C Marye Round, MD, 12.5 mg at 05/23/11 0911;  DISCONTD: phenytoin (DILANTIN) injection 100 mg, 100 mg, Intravenous, Q8H, Theressa Millard, MD, 100 mg at 05/24/11 [id number] DISCONTD: phytonadione (VITAMIN K) 1 MG/0.5ML injection 5 mg, 5 mg, Intramuscular, STAT, Theressa Millard, MD   [facility] 05/24/11 1120 05/24/11 0351 05/23/11 0645  WBC -- 12.9* 14.1*  HGB -- 12.4 13.6  HCT -- 37.9 40.6  PLT -- 308 342  INR 1.33 2.02* --  PTT -- -- --    CT of head:  Minimal small vessel chronic ischemic changes of deep cerebral white matter. 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 Findings are  progressive since previous study    Patient was placed in the lateral decub/sitting position.  Area was cleaned with betadine and anesthetized with lidocaine.  Under sterile conditions 20G LP needle placement was attempted.  CSF fluid was not obtained.  No complications were noted.  Patient scheduled for procedure under fluoroscopy in the morning.  Alexis Goodell, MD [facility] [phone] 05/24/2011  5:27 PM

## 2011-05-24 NOTE — Progress Notes (Signed)
Speech Language/Pathology SLP Cancellation Note  ST received order for BSE and not completed secondary to patient intubated.  ST to follow for POC. Sharman Crate MS., CCC-SLP [id number]  [facility] 05/24/2011, 1:54 PM

## 2011-05-24 NOTE — Progress Notes (Signed)
Dr. Shanon Rosser contacted Glorious Peach, the patient's husband, to get consent over the phone to perform a lumbar puncture at bedside for sampling of cerebral spinal fluid. Possible complications were explained and questions answered to the husbands satisfaction. I verified the consent with the husband.

## 2011-05-24 NOTE — Progress Notes (Signed)
Name: [name] [name] MRN: [id number] DOB: 02/02/1941  ELECTRONIC ICU PHYSICIAN NOTE  Problem:  Contacted by RN.  Patient has developed atrial fibrillation with RVR.  Has a history of this.  Currently is normotensive, but recently hypotension requiring fluid boluses.  Intervention:  Amiodarone bolus and infusion.  Penne Lash, M.D. [facility] [place] Cell: [phone] Pager: [phone]  05/24/2011, 9:36 PM

## 2011-05-24 NOTE — Consult Note (Addendum)
Patient name: [name] [name] Medical record number: [id number] Date of birth: September 17, 1940 Age: 71 y.o. Gender: female PCP: [name], MD, MD  Date: 05/24/2011 Reason for Consult: respiratory failure Referring Physician: Marye Round   Lines/tubes ETT 3/24>>> Foley 3/24>>>  Culture data/sepsis markers 3/18 UCx: e.coli 3/23 UCx E. coli  Antibiotics Acyclovir 3/24>>> Ceftriaxone 3/22>>>  Best practice GI ppx: PPI DVT ppx: SCD (INR elevated)  Protocols/consults Adult Vent protocol  Events/studies 3/16 noncontrast CT brain: New cortical hypodensity at left temporal lobe 3/16 MRI: Abnormal signal at left hippocampus and right temporal lobe. Consistent with HSV, glioma, paraneoplastic syndrome. The left hippocampus lesion is consistent also with seizure focus.   HPI:  71 year old female with past medical history significant for recent hospital discharge for altered mental status admitted on 3/22 with recurrent nausea and abdominal pain.  During her 3/16-3/19 admission, she presented with headache. A CT and MRI were obtained at the time. The CT scan demonstrated new hypodensity at the left temporal lobe.  The MRI showed abnormal signal at the left hippocampus and the right temporal lobe consistent with HSV, glioma, or paraneoplastic syndrome.  The abnormal signal at the left hippocampus was also consistent with seizure.  Infectious disease consult was obtained at that time, but since the patient improved drastically without antibiotics over 24 hours, her symptoms were attributed to an Escherichia coli UTI.  She was discharged without antibiotics. No LP was performed.  The patient represented on 3/22 nausea and abdominal pain. Infectious disease was reconsulted and her symptoms were again attributed to delirium from Escherichia coli UTI. On 3/23, patient began having right facial twitching as well as right upper extremity twitching consistent with focal seizure. 4 mg of Ativan were administered  at 2315 and patient was transferred to 3111.  At this point, I was asked to evaluate the patient for intubation for airway protection and to allow for CT scan.     Past Medical History  Diagnosis Date  . Blindness of left eye   . Eye muscle weakness     Right eye weakness after cataract surgery  . Common migraine     ?hx of  . DIABETES MELLITUS, TYPE II 03/27/2006  . HYPERLIPIDEMIA 03/27/2006  . 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 HYPERTENSION 03/27/2006  . Atrial fibrillation     SVT dx 2007, cath 2007 mild CAD, then had an cardioversion, ablation; still on coumadin , has occ palpitation, EKG 03-2010 NSR  . Headache 05/07/2009  . LUNG NODULE 09/01/2006    Excision, Bx [facility]  . Osteopenia 2004    Dexa 2004 showed Osteopenia, DEXA 03/2007 normal    History reviewed. No pertinent past surgical history.  Family History  Problem Relation Age of Onset  . Diabetes Father   . Heart attack Father 110  . Diabetes Sister   . Cancer Neg Hx     no hx of colon or breast cancer    Social History:  reports that she quit smoking about 33 years ago. She does not have any smokeless tobacco history on file. She reports that she does not drink alcohol or use illicit drugs.  Allergies:  Allergies  Allergen Reactions  . Procaine Hcl Anaphylaxis    Medications:  Prior to Admission medications   Medication Sig Start Date End Date Taking? Authorizing Provider  aspirin EC 81 MG tablet Take 81 mg by mouth daily.   Yes Historical Provider, MD  calcium-vitamin D (OSCAL WITH D) 500-200 MG-UNIT per tablet Take 1 tablet  by mouth daily.   Yes Historical Provider, MD  cefPROZIL (CEFZIL) 500 MG tablet Take 500 mg by mouth 2 (two) times daily.   Yes Historical Provider, MD  diltiazem (DILACOR XR) 120 MG 24 hr capsule Take 120 mg by mouth daily.   Yes Historical Provider, MD  insulin NPH-insulin regular (NOVOLIN 70/30) (70-30) 100 UNIT/ML injection Inject 10-50 Units into the skin 2 (two) times daily with  a meal. 50 unit in am and 10-15 units in the pm.   Yes Historical Provider, MD  irbesartan (AVAPRO) 150 MG tablet Take 150 mg by mouth at bedtime.   Yes Historical Provider, MD  metoprolol tartrate (LOPRESSOR) 25 MG tablet Take 12.5 mg by mouth 2 (two) times daily.   Yes Historical Provider, MD  Multiple Vitamin (MULTIVITAMIN) tablet Take 1 tablet by mouth daily.     Yes Historical Provider, MD  pravastatin (PRAVACHOL) 40 MG tablet Take 40 mg by mouth daily.   Yes Historical Provider, MD  warfarin (COUMADIN) 5 MG tablet Take 5 mg by mouth as directed. Take 1 tab on Wed and Fri. Take 1/2 tab on Mon, Tues, Thurs, Sat and Sun  Anti-coagulant medication.  Patient needs a consult. 07/17/10  Yes Evans Lance, MD    Review of systems not obtained due to patient factors.  Temp:  [98.2 F (36.8 C)-99.2 F (37.3 C)] 98.3 F (36.8 C) (03/23 2029) Pulse Rate:  [73-108] 82  (03/23 2029) Resp:  [18-20] 18  (03/23 2029) BP: (107-194)/(47-81) 134/47 mmHg (03/24 0116) SpO2:  [92 %-100 %] 94 % (03/23 2029) FiO2 (%):  [50 %] 50 % (03/24 0116)    Intake/Output Summary (Last 24 hours) at 05/24/11 0132 Last data filed at 05/23/11 1900  Gross per 24 hour  Intake 1212.25 ml  Output   1800 ml  Net -587.75 ml    Physical exam Gen.: Elderly white female snoring loudly   neuro: Patient is unarousable, and. She does not open her eyes. She does not verbalize. She did appear to move both upper extremities spontaneously prior to intubation. Pupils pinpoint and minimally reactive, but equal. CV: Regular rate and rhythm no murmurs rubs or gallops  pulmonary: Clear to auscultation bilaterally. Snoring loudly GI: Soft nontender nondistended Extremities: No edema or joint effusions Skin: No rash. Warm   Vent Mode:  [-] PRVC FiO2 (%):  [50 %] 50 % Set Rate:  [15 bmp] 15 bmp Vt Set:  [480 mL] 480 mL PEEP:  [5 cmH20] 5 cmH20 Plateau Pressure:  [16 cmH20] 16 cmH20  LAB RESULT Lab Results  Component Value  Date   CREATININE 0.70 05/23/2011   BUN 15 05/23/2011   NA 134* 05/23/2011   K 4.1 05/23/2011   CL 92* 05/23/2011   CO2 32 05/23/2011   Lab Results  Component Value Date   WBC 14.1* 05/23/2011   HGB 13.6 05/23/2011   HCT 40.6 05/23/2011   MCV 86.8 05/23/2011   PLT 342 05/23/2011   Lab Results  Component Value Date   ALT 21 05/22/2011   AST 26 05/22/2011   ALKPHOS 74 05/22/2011   BILITOT 0.5 05/22/2011   Lab Results  Component Value Date   INR 2.07* 05/23/2011   INR 1.66* 05/22/2011   INR 1.41 05/19/2011   PROTIME 19.0 07/26/2008     Assessment and Plan  Acute Respiratory failure -patient intubated for airway protection -Adult ventilator protocol -Sedation: Dexmedetomidine -Goal will be to wean to pressure support over the next 24 hours  Delirium: Workup and treatment per primary team   GI prophylaxis: PPI  DVT prophylaxis: SCDs. Patient still hypercoagulable, attributed to warfarin for atrophy ablation.  Hyperglycemia: We'll defer management to primary team     Total critical care time spent: >75min  Xzavier Swinger 05/24/2011, 1:32 AM

## 2011-05-24 NOTE — Progress Notes (Signed)
Approximately 2045 on 05/23/11 rapid response was called to evaluate the patient for noted worsening mental status such as lethargy and patient was unable to follow all commands.  SBP was elevated at 195 and O2 was 89-91% on RA.  Patient was placed on 0.5 L [place] and sats went up to 94-95%.  MD was notified and assessed patient and reviewed patient's chart.  Bedside swallow evaluation ordered when patient could not tolerate liquids.  She failed and was placed on NPO and IV fluids were changed to accommodate change in oral intake as patient is diabetic.  Continued to monitor patient closely.  At approximately 2245 on 05/23/11 rapid response was again called; the patient began to have right sided facial focal seizure activity.  Rogue Bussing, N.P. Came to bedside and 4mg  of ativan was administered and seizure activity halted.  Order was given for transfer to ICU.  Report was called to Santiago Glad, RN on 3100 and patient was transported.  It should also be noted that just before transport the family was notified of all that occurred as well as the plan for patient transport to ICU.  Soriya Worster, Josephine Cables, RN

## 2011-05-24 NOTE — Progress Notes (Signed)
[facility] hospitalist progress note. Chief complaint. Seizure activity. History of present illness. 71 year old female in hospital with altered mental status thought secondary to encephalopathy due to UTI. I saw the patient earlier for nursing concerns that the patient had waxing and waning mental status. A swallowing study was requested due to suggestions that the patient was choking on liquids and she was found to have failed this study. She was made n.p.o. A few hours later the patient was noted with focal seizure activity. This mainly seemed to affect the right face and right upper extremity. This appeared consistent with status epilepticus as the seizure had been going on for greater than 10 minutes when I was called to the bedside. I did give the patient Ativan 1 mg and repeated this for a total of 4 mg dosing until her seizures resolved. Dr. Arnoldo Morale was also notified by rapid response. Dr. Arnoldo Morale notified neurology on-call Dr. Nicole Kindred who will see the patient later this a.m. We did move the patient to neurologic ICU. Patient appeared postictal but stable. We started the acyclovir per pharmacy consult. Reversed her INR with vitamin K. I did notify pulmonology/critical care of the patient's change and new presence in the intensive care unit. Dr. Arnoldo Morale left orders for a Dilantin load and this is proceeding as well. I will add order for Ativan 2 mg every 4 hours as needed for seizure activity. I also requested a stat CT scan of the head. The patient will be monitored closely with neuro checks and vital signs. I have ordered a stat arterial blood gas to evaluate gas exchange and any potential need for intubation

## 2011-05-24 NOTE — Progress Notes (Signed)
Called to assist with care of patient having right side facial twitching with right arm involvement. Patient is unresponsive to verbal. Movement resembles focal seizure activity. Airway is patent. Skin is warm and dry. Heart sounds regular. On-call physician was made aware and orders requested for seizure control. Harless Litten, NP came to patient's bedside. Patient was given Ativan 4mg  total over the course of 30 minutes for seizure control. Seizure activity did resolve. Also spoke with Dr. Arnoldo Morale about patient's condition. Orders were received to reverse patients INR level. Patient was transferred to 3111 for closure observation.

## 2011-05-24 NOTE — Progress Notes (Signed)
INFECTIOUS DISEASE PROGRESS NOTE    Date of Admission:  05/22/2011   Total days of antibiotics 2        Day 1 acyclovir        Day 2 ceftriaxone         Principal Problem:  *Toxic metabolic encephalopathy Active Problems:  DIABETES MELLITUS, TYPE II  HYPERLIPIDEMIA  PERIPHERAL NEUROPATHY  HYPERTENSION  Atrial fibrillation  Hypokalemia  E. coli UTI (urinary tract infection)  Seizure  Acute respiratory failure with hypoxia      . acyclovir  10 mg/kg (Ideal) Intravenous Q8H  . aspirin EC  81 mg Oral Daily  . atorvastatin  10 mg Oral q1800  . calcium-vitamin D  1 tablet Oral Q breakfast  . cefTRIAXone (ROCEPHIN)  IV  1 g Intravenous Q24H  . chlorhexidine      . diltiazem  120 mg Oral Daily  . docusate sodium  100 mg Oral BID  . fentaNYL      . insulin aspart  0-7 Units Subcutaneous Q4H  . LORazepam      . LORazepam  1 mg Intravenous Once  . LORazepam  1 mg Intravenous Once  . LORazepam  1 mg Intravenous Once  . LORazepam  1 mg Intravenous Once  . metoprolol  2.5 mg Intravenous Q12H  . mulitivitamin with minerals  1 tablet Oral Daily  . pantoprazole (PROTONIX) IV  40 mg Intravenous Daily  . phenytoin (DILANTIN) IV  1,000 mg Intravenous STAT  . phenytoin (DILANTIN) IV  100 mg Intravenous Q8H  . phytonadione (VITAMIN K) IV  5 mg Intravenous STAT  . propofol      . rocuronium  60 mg Intravenous Once  . simvastatin  20 mg Oral QHS  . DISCONTD: insulin aspart  0-7 Units Subcutaneous Q4H  . DISCONTD: insulin aspart  0-9 Units Subcutaneous TID WC  . DISCONTD: insulin aspart  0-9 Units Subcutaneous Q4H  . DISCONTD: insulin glargine  5 Units Subcutaneous QHS  . DISCONTD: metoprolol tartrate  12.5 mg Oral BID  . DISCONTD: phenytoin (DILANTIN) IV  100 mg Intravenous Q8H  . DISCONTD: phytonadione  5 mg Intramuscular STAT    Subjective: Events overnight noted, patient became less responsive, seizure, intubated for airway protection  Objective: Temp:  [98.2 F (36.8 C)-100.4  F (38 C)] 100.2 F (37.9 C) (03/24 0830) Pulse Rate:  [39-108] 66  (03/24 0900) Resp:  [14-24] 21  (03/24 0900) BP: (76-194)/(44-81) 171/62 mmHg (03/24 0900) SpO2:  [86 %-100 %] 97 % (03/24 0900) FiO2 (%):  [29.7 %-50.5 %] 30.1 % (03/24 0900) Weight:  [212 lb 1.3 oz (96.2 kg)] 212 lb 1.3 oz (96.2 kg) (03/24 0100)  General: alert, intubated Skin: no rashes Lungs: CTA B Cor: RRR [place] - seems to respond to questions, sleepy  Lab Results Lab Results  Component Value Date   WBC 12.9* 05/24/2011   HGB 12.4 05/24/2011   HCT 37.9 05/24/2011   MCV 87.1 05/24/2011   PLT 308 05/24/2011    Lab Results  Component Value Date   CREATININE 0.97 05/24/2011   BUN 21 05/24/2011   NA 133* 05/24/2011   K 3.4* 05/24/2011   CL 93* 05/24/2011   CO2 28 05/24/2011    Lab Results  Component Value Date   ALT 21 05/22/2011   AST 26 05/22/2011   ALKPHOS 74 05/22/2011   BILITOT 0.5 05/22/2011       Microbiology: Recent Results (from the past 240 hour(s))  CULTURE, BLOOD (ROUTINE  X 2)     Status: Normal   Collection Time   05/17/11  7:33 PM      Component Value Range Status Comment   Specimen Description BLOOD LEFT ARM   Final    Special Requests     Final    Value: BOTTLES DRAWN AEROBIC AND ANAEROBIC 10CC AEROBIC 8CC ANAEROBIC   Culture  Setup Time [id number]   Final    Culture NO GROWTH 5 DAYS   Final    Report Status 05/24/2011 FINAL   Final   CULTURE, BLOOD (ROUTINE X 2)     Status: Normal   Collection Time   05/17/11  7:38 PM      Component Value Range Status Comment   Specimen Description BLOOD LEFT ARM   Final    Special Requests     Final    Value: BOTTLES DRAWN AEROBIC AND ANAEROBIC 10CC AEROBIC 8CC ANAEROBIC   Culture  Setup Time [id number]   Final    Culture NO GROWTH 5 DAYS   Final    Report Status 05/24/2011 FINAL   Final   URINE CULTURE     Status: Normal   Collection Time   05/18/11  3:02 AM      Component Value Range Status Comment   Specimen Description URINE, RANDOM   Final     Special Requests NONE   Final    Culture  Setup Time [id number]   Final    Colony Count >=100,000 COLONIES/ML   Final    Culture ESCHERICHIA COLI   Final    Report Status 05/20/2011 FINAL   Final    Organism ID, Bacteria ESCHERICHIA COLI   Final   URINE CULTURE     Status: Normal (Preliminary result)   Collection Time   05/22/11 10:56 AM      Component Value Range Status Comment   Specimen Description URINE, CATHETERIZED   Final    Special Requests NONE   Final    Culture  Setup Time [id number]   Final    Colony Count 45,000 COLONIES/ML   Final    Culture ESCHERICHIA COLI   Final    Report Status PENDING   Incomplete   MRSA PCR SCREENING     Status: Normal   Collection Time   05/24/11 12:20 AM      Component Value Range Status Comment   MRSA by PCR NEGATIVE  NEGATIVE  Final     Studies/Results: Ct Head Wo Contrast  05/24/2011  *RADIOLOGY REPORT*  Clinical Data: New seizure activity  CT HEAD WITHOUT CONTRAST  Technique:  Contiguous axial images were obtained from the base of the skull through the vertex without contrast.  Comparison: 05/16/2011 Correlation:  MRI brain 05/17/2011  Findings: Normal ventricular morphology. No midline shift or mass effect. Minimal small vessel chronic ischemic changes of deep cerebral white matter. Large area of hypoattenuation identified medial aspect of the left temporal lobe increased in size since previous exam. Remaining brain parenchyma normal appearance. No intracranial hemorrhage or additional infarction identified. No definite extra-axial fluid collection. Visualized paranasal sinuses and mastoid air cells clear. Bones demineralized.  IMPRESSION: Minimal small vessel chronic ischemic changes of deep cerebral white matter. 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 Findings are  progressive since previous study.  Original Report Authenticated By: Burnetta Sabin, M.D.  Dg Chest Port 1 View  05/24/2011  *RADIOLOGY REPORT*  Clinical Data: Intubation  PORTABLE CHEST - 1 VIEW  Comparison: Portable exam 0114 hours compared to 05/16/2011  Findings: Tip of endotracheal tube 4.1 cm above carina. Enlargement of cardiac silhouette. Mediastinal contours and pulmonary vascularity normal. Atherosclerotic calcification aorta. Bibasilar atelectasis. Lungs otherwise clear. Bones unremarkable. No pneumothorax.  IMPRESSION: Enlargement of cardiac silhouette. Bibasilar atelectasis. Satisfactory endotracheal tube position.  Original Report Authenticated By: Burnetta Sabin, M.D.     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  CT noted, progressive since last scan.   Plan: 1. LP when possible, continue acyclovir.    Scharlene Gloss, MD

## 2011-05-24 NOTE — Progress Notes (Signed)
eLink Physician-Brief Progress Note Patient Name: DERYA POYNER DOB: Jul 14, 1940 MRN: [id number]  Date of Service  05/24/2011   HPI/Events of Note  Need for closer HD monitoring in patient with labile BP and seizure activity   eICU Interventions  Plan: Order for aline placement   Intervention Category Intermediate Interventions: Other:  Ellie Spickler 05/24/2011, 11:33 PM

## 2011-05-24 NOTE — Progress Notes (Signed)
Patient name: [name] [name] Medical record number: [id number] Date of birth: March 11, 1940 Age: 71 y.o. Gender: female PCP: [name], MD, MD  [facility] PCCM PROGRESS NOTE   Date: 05/24/2011 Reason for Consult: respiratory failure Referring Physician: Marye Round  HPI:  71 y/o female with new left temporal lobe lesion, seizures and delirium who was intubated for airway protection on 3/24 after seizures and receiving benzodiazepines.  She had recently been hospitalized at [facility] from 3/16 through 3/19 for similar issues.  Lines/tubes ETT 3/24>>> Foley 3/24>>>  Culture data/sepsis markers 3/18 UCx: e.coli pan sensitive 3/23 UCx E. coli  Antibiotics Acyclovir 3/24>>> Ceftriaxone 3/22>>>  Best practice GI ppx: PPI DVT ppx: SCD (INR elevated)  Protocols/consults Adult Vent protocol  Events/studies 3/16 noncontrast CT brain: New cortical hypodensity at left temporal lobe 3/16 MRI: Abnormal signal at left hippocampus and right temporal lobe. Consistent with HSV, glioma, paraneoplastic syndrome. The left hippocampus lesion is consistent also with seizure focus. 3/24 CT head: enlarging area of low attenuation medial left temporal lobe  Temp:  [98.2 F (36.8 C)-100.4 F (38 C)] 100.2 F (37.9 C) (03/24 0815) Pulse Rate:  [39-108] 58  (03/24 0815) Resp:  [14-24] 18  (03/24 0815) BP: (76-194)/(44-81) 152/58 mmHg (03/24 0815) SpO2:  [86 %-100 %] 98 % (03/24 0800) FiO2 (%):  [29.7 %-50.5 %] 30.2 % (03/24 0800) Weight:  [96.2 kg (212 lb 1.3 oz)] 96.2 kg (212 lb 1.3 oz) (03/24 0100)    Intake/Output Summary (Last 24 hours) at 05/24/11 0844 Last data filed at 05/24/11 0745  Gross per 24 hour  Intake 1226.25 ml  Output   3300 ml  Net -2073.75 ml    Physical exam Gen: sedated on vent HEENT: NCAT, ETT in place, pinpoint but equal pupils PULM: CTA B CV: RRR, no mgr AB: BS+, soft, nontender, no hsm Ext: warm, trace edema Neuro: Sedated on vent (currently on Precedex infusion)   Vent Mode:   [-] PRVC FiO2 (%):  [29.7 %-50.5 %] 30.2 % Set Rate:  [15 bmp-18 bmp] 18 bmp Vt Set:  [480 mL] 480 mL PEEP:  [5 cmH20] 5 cmH20 Plateau Pressure:  [16 cmH20-17 cmH20] 17 cmH20  LAB RESULT Lab Results  Component Value Date   CREATININE 0.97 05/24/2011   BUN 21 05/24/2011   NA 133* 05/24/2011   K 3.4* 05/24/2011   CL 93* 05/24/2011   CO2 28 05/24/2011   Lab Results  Component Value Date   WBC 12.9* 05/24/2011   HGB 12.4 05/24/2011   HCT 37.9 05/24/2011   MCV 87.1 05/24/2011   PLT 308 05/24/2011   Lab Results  Component Value Date   ALT 21 05/22/2011   AST 26 05/22/2011   ALKPHOS 74 05/22/2011   BILITOT 0.5 05/22/2011   Lab Results  Component Value Date   INR 2.02* 05/24/2011   INR 2.07* 05/23/2011   INR 1.66* 05/22/2011   PROTIME 19.0 07/26/2008     Assessment and Plan  71 y/o female with new left temporal lobe lesion, seizures and delirium who was intubated for airway protection on 3/24 after seizures and receiving benzodiazepines.  Acute Respiratory failure: patient intubated for airway protection 3/24 for CT scan - stop sedation and wean vent now - if not able to extubate, then resume PRVC at decreased rate - daily wua/sbt/cxr/abg  Delirium: likely due to left temporal lobe lesion; worrisome for HSV - needs LP as soon as INR < 1.5 - try to extubate so she can to for IR guided  LP - if no wean then LP in 3100 by neurology  UTI: 3/18 pan sensitive E.coli - ceftriaxone  A-fib - correcting INR /holding warfarin for LP - metoprolol  DM2 - SSI   Seizure: likely due to L temporal lobe lesions - dilantin per neurology  DVT prophylaxis: SCDs.   Disposition: She is currently on [facility] service with PCCM consulting because she was intubated for airway protection for a head CT.  However, if she is not extubated this morning she will need to transfer to the PCCM service today.   Additional CC time today 50 minutes  Reathel Turi 05/24/2011, 8:44 AM

## 2011-05-25 ENCOUNTER — Inpatient Hospital Stay (HOSPITAL_COMMUNITY): Payer: Medicare Other

## 2011-05-25 ENCOUNTER — Telehealth: Payer: Self-pay | Admitting: Internal Medicine

## 2011-05-25 DIAGNOSIS — G40309 Generalized idiopathic epilepsy and epileptic syndromes, not intractable, without status epilepticus: Secondary | ICD-10-CM

## 2011-05-25 DIAGNOSIS — Z9911 Dependence on respirator [ventilator] status: Secondary | ICD-10-CM

## 2011-05-25 DIAGNOSIS — B004 Herpesviral encephalitis: Secondary | ICD-10-CM

## 2011-05-25 LAB — CBC
Hemoglobin: 11.8 g/dL — ABNORMAL LOW (ref 12.0–15.0)
MCH: 28.9 pg (ref 26.0–34.0)
MCV: 87.8 fL (ref 78.0–100.0)
RBC: 4.09 MIL/uL (ref 3.87–5.11)

## 2011-05-25 LAB — TYPE AND SCREEN: Antibody Screen: NEGATIVE

## 2011-05-25 LAB — PREPARE FRESH FROZEN PLASMA
Unit division: 0
Unit division: 0

## 2011-05-25 LAB — CSF CELL COUNT WITH DIFFERENTIAL
Monocyte-Macrophage-Spinal Fluid: 4 % — ABNORMAL LOW (ref 15–45)
RBC Count, CSF: 38 /mm3 — ABNORMAL HIGH
Tube #: 2

## 2011-05-25 LAB — BLOOD GAS, ARTERIAL
Drawn by: 13898
PEEP: 5 cmH2O
RATE: 18 resp/min
pCO2 arterial: 33.7 mmHg — ABNORMAL LOW (ref 35.0–45.0)
pO2, Arterial: 63.7 mmHg — ABNORMAL LOW (ref 80.0–100.0)

## 2011-05-25 LAB — GLUCOSE, CAPILLARY
Glucose-Capillary: 211 mg/dL — ABNORMAL HIGH (ref 70–99)
Glucose-Capillary: 280 mg/dL — ABNORMAL HIGH (ref 70–99)
Glucose-Capillary: 226 mg/dL — ABNORMAL HIGH (ref 70–99)
Glucose-Capillary: 196 mg/dL — ABNORMAL HIGH (ref 70–99)
Glucose-Capillary: 146 mg/dL — ABNORMAL HIGH (ref 70–99)

## 2011-05-25 LAB — PATHOLOGIST SMEAR REVIEW: Tech Review: NORMAL

## 2011-05-25 LAB — BASIC METABOLIC PANEL
CO2: 28 mEq/L (ref 19–32)
Glucose, Bld: 222 mg/dL — ABNORMAL HIGH (ref 70–99)
Potassium: 2.4 mEq/L — CL (ref 3.5–5.1)
Sodium: 135 mEq/L (ref 135–145)

## 2011-05-25 MED ORDER — DEXTROSE 5 % IV SOLN
10.0000 mg/kg | Freq: Three times a day (TID) | INTRAVENOUS | Status: DC
  Administered 2011-05-25 – 2011-05-30 (×15): 640 mg via INTRAVENOUS
  Filled 2011-05-25 (×19): qty 12.8

## 2011-05-25 MED ORDER — LABETALOL HCL 5 MG/ML IV SOLN
10.0000 mg | INTRAVENOUS | Status: DC | PRN
  Administered 2011-05-25 – 2011-05-28 (×3): 10 mg via INTRAVENOUS
  Filled 2011-05-25 (×2): qty 4

## 2011-05-25 MED ORDER — ASPIRIN 81 MG PO CHEW
81.0000 mg | CHEWABLE_TABLET | Freq: Every day | ORAL | Status: DC
  Administered 2011-05-26 – 2011-06-02 (×8): 81 mg
  Filled 2011-05-25 (×8): qty 1

## 2011-05-25 MED ORDER — HYDRALAZINE HCL 20 MG/ML IJ SOLN
10.0000 mg | INTRAMUSCULAR | Status: DC | PRN
  Administered 2011-05-28: 10 mg via INTRAVENOUS
  Filled 2011-05-25: qty 1

## 2011-05-25 MED ORDER — POTASSIUM CHLORIDE 20 MEQ/15ML (10%) PO LIQD
40.0000 meq | ORAL | Status: AC
  Administered 2011-05-25 (×2): 40 meq
  Filled 2011-05-25 (×3): qty 30

## 2011-05-25 MED ORDER — INSULIN ASPART 100 UNIT/ML ~~LOC~~ SOLN
0.0000 [IU] | SUBCUTANEOUS | Status: DC
  Administered 2011-05-25: 3 [IU] via SUBCUTANEOUS
  Administered 2011-05-25 – 2011-05-26 (×2): 4 [IU] via SUBCUTANEOUS
  Administered 2011-05-26: 3 [IU] via SUBCUTANEOUS
  Administered 2011-05-26: 4 [IU] via SUBCUTANEOUS
  Administered 2011-05-26 (×2): 3 [IU] via SUBCUTANEOUS
  Administered 2011-05-26: 4 [IU] via SUBCUTANEOUS
  Administered 2011-05-27: 7 [IU] via SUBCUTANEOUS
  Administered 2011-05-27 (×2): 4 [IU] via SUBCUTANEOUS
  Administered 2011-05-28: 3 [IU] via SUBCUTANEOUS

## 2011-05-25 MED ORDER — METOPROLOL TARTRATE 1 MG/ML IV SOLN: 2.5000 mg | INTRAVENOUS | Status: DC | PRN

## 2011-05-25 MED ORDER — JEVITY 1.2 CAL PO LIQD
1000.0000 mL | ORAL | Status: DC
  Administered 2011-05-25: 1000 mL
  Filled 2011-05-25 (×4): qty 1000

## 2011-05-25 MED ORDER — DOCUSATE SODIUM 50 MG/5ML PO LIQD
100.0000 mg | Freq: Two times a day (BID) | ORAL | Status: DC
  Filled 2011-05-25 (×2): qty 10

## 2011-05-25 MED ORDER — POTASSIUM CHLORIDE 10 MEQ/50ML IV SOLN: 10.0000 meq | INTRAVENOUS | Status: DC

## 2011-05-25 MED ORDER — INSULIN GLARGINE 100 UNIT/ML ~~LOC~~ SOLN
30.0000 [IU] | Freq: Two times a day (BID) | SUBCUTANEOUS | Status: DC
  Administered 2011-05-25 – 2011-05-27 (×5): 30 [IU] via SUBCUTANEOUS

## 2011-05-25 MED ORDER — SODIUM CHLORIDE 0.9 % IV SOLN
500.0000 mg | Freq: Once | INTRAVENOUS | Status: AC
  Administered 2011-05-25: 500 mg via INTRAVENOUS
  Filled 2011-05-25: qty 10

## 2011-05-25 MED ORDER — LABETALOL HCL 5 MG/ML IV SOLN
INTRAVENOUS | Status: AC
  Administered 2011-05-25: 10 mg via INTRAVENOUS
  Filled 2011-05-25: qty 4

## 2011-05-25 MED ORDER — POTASSIUM CHLORIDE 20 MEQ/15ML (10%) PO LIQD
40.0000 meq | Freq: Three times a day (TID) | ORAL | Status: AC
  Administered 2011-05-25 – 2011-05-26 (×3): 40 meq
  Filled 2011-05-25 (×3): qty 30

## 2011-05-25 MED ORDER — PANTOPRAZOLE SODIUM 40 MG PO PACK
40.0000 mg | PACK | Freq: Every day | ORAL | Status: DC
  Administered 2011-05-26: 40 mg
  Filled 2011-05-25 (×2): qty 20

## 2011-05-25 MED ORDER — SODIUM CHLORIDE 0.9 % IV SOLN
INTRAVENOUS | Status: DC
  Administered 2011-05-25: 08:00:00 via INTRAVENOUS

## 2011-05-25 NOTE — Procedures (Signed)
Technique:  Informed consent was obtained from the patient prior to the procedure, including potential complications of headache, allergy, and pain.   With the patient prone, the lower back was prepped with Betadine.  1% Lidocaine was used for local anesthesia.  Lumbar puncture was performed at the [facility] L3-L4] level using a [20] gauge needle with return of [clear yellow tinged  ] CSF with an opening pressure of [15] cm water.   [12] ml of CSF were obtained for laboratory studies.  The patient tolerated the procedure well and there were no apparent complications. The patient is intubated during the procedure.  Successful lumbar puncture. Clear yellow tinged CSF

## 2011-05-25 NOTE — Telephone Encounter (Signed)
thank you !!

## 2011-05-25 NOTE — Progress Notes (Signed)
Subjective: Patient had two seizures overnight.  Was hypertensive overnight as well.  Not as responsive this morning.  Was given 500mg  of Dilantin after seizure activity noted due to subtherapeutic level of 6.8.  Repeat level pending.  No further seizure activity noted.  Bedside LP unsuccessful.    Objective: Current vital signs: BP 120/39  Pulse 122  Temp(Src) 102.1 F (38.9 C) (Axillary)  Resp 18  Ht 5\' 8"  (1.727 m)  Wt 95.3 kg (210 lb 1.6 oz)  BMI 31.95 kg/m2  SpO2 88% Vital signs in last 24 hours: Temp:  [99.4 F (37.4 C)-102.8 F (39.3 C)] 102.1 F (38.9 C) (03/25 0600) Pulse Rate:  [57-135] 122  (03/25 0600) Resp:  [10-25] 18  (03/25 0600) BP: (104-197)/(35-89) 120/39 mmHg (03/25 0600) SpO2:  [83 %-100 %] 88 % (03/25 0600) Arterial Line BP: (181-240)/(46-61) 181/46 mmHg (03/25 0600) FiO2 (%):  [29.9 %-40 %] 40 % (03/25 0600) Weight:  [95.3 kg (210 lb 1.6 oz)] 95.3 kg (210 lb 1.6 oz) (03/25 0600)  Intake/Output from previous day: 03/24 0701 - 03/25 0700 In: 1017 [I.V.:240; Blood:607; IV Piggyback:170] Out: 2155 [Urine:2155] Intake/Output this shift:   Nutritional status: NPO  Neurologic Exam: Mental Status: Patient does not respond to verbal stimuli.  Opens eyes to sternal rub.  Does not follow commands.  No verbalizations noted.  Cranial Nerves: II: patient does not respond confrontation bilaterally, pupils right 3 mm, left 3 mm,and reactive bilaterally III,IV,VI: doll's response absent bilaterally. . V,VII: weaker eye closure on the right as compared to the left VIII: patient does not respond to verbal stimuli IX,X: gag reflex not tested, XI: trapezius strength unable to test bilaterally XII: tongue strength unable to test Motor: Movement noted in the right lower extremity in response to stimulation.  The only movement noted in the left lower extremity is withdrawal to pain and less so than on the right.  Upper extremities in restraints but attempts a hand grip on  the right that is not attempted on the left.   Deep Tendon Reflexes:  Absent throughout. Plantars: upgoing bilaterally Cerebellar: Unable to perform    Lab Results: Results for orders placed during the hospital encounter of 05/22/11 (from the past 48 hour(s))  GLUCOSE, CAPILLARY     Status: Abnormal   Collection Time   05/23/11  8:47 AM      Component Value Range Comment   Glucose-Capillary 188 (*) 70 - 99 (mg/dL)   GLUCOSE, CAPILLARY     Status: Abnormal   Collection Time   05/23/11 11:49 AM      Component Value Range Comment   Glucose-Capillary 214 (*) 70 - 99 (mg/dL)   GLUCOSE, CAPILLARY     Status: Abnormal   Collection Time   05/23/11  4:55 PM      Component Value Range Comment   Glucose-Capillary 249 (*) 70 - 99 (mg/dL)   GLUCOSE, CAPILLARY     Status: Abnormal   Collection Time   05/23/11  8:34 PM      Component Value Range Comment   Glucose-Capillary 194 (*) 70 - 99 (mg/dL)   PREPARE FRESH FROZEN PLASMA     Status: Normal (Preliminary result)   Collection Time   05/23/11 11:59 PM      Component Value Range Comment   Unit Number [id number]      Blood Component Type THAWED PLASMA      Unit division 00      Status of Unit ISSUED  Transfusion Status OK TO TRANSFUSE      Unit Number [id number]      Blood Component Type THAWED PLASMA      Unit division 00      Status of Unit ISSUED      Transfusion Status OK TO TRANSFUSE     TYPE AND SCREEN     Status: Normal   Collection Time   05/23/11 11:59 PM      Component Value Range Comment   ABO/RH(D) A NEG      Antibody Screen NEG      Sample Expiration 05/26/2011     MRSA PCR SCREENING     Status: Normal   Collection Time   05/24/11 12:20 AM      Component Value Range Comment   MRSA by PCR NEGATIVE  NEGATIVE    GLUCOSE, CAPILLARY     Status: Abnormal   Collection Time   05/24/11 12:51 AM      Component Value Range Comment   Glucose-Capillary 288 (*) 70 - 99 (mg/dL)   BLOOD GAS, ARTERIAL     Status: Abnormal    Collection Time   05/24/11  2:30 AM      Component Value Range Comment   FIO2 0.50      Delivery systems VENTILATOR      Mode PRESSURE REGULATED VOLUME CONTROL      VT 480      Rate 15      Peep/cpap 5.0      pH, Arterial 7.506 (*) 7.350 - 7.400     pCO2 arterial 38.6  35.0 - 45.0 (mmHg)    pO2, Arterial 154.0 (*) 80.0 - 100.0 (mmHg)    Bicarbonate 30.2 (*) 20.0 - 24.0 (mEq/L)    TCO2 31.4  0 - 100 (mmol/L)    Acid-Base Excess 6.8 (*) 0.0 - 2.0 (mmol/L)    O2 Saturation 99.8      Patient temperature 98.6      Collection site RIGHT RADIAL      Drawn by [phone]      Sample type ARTERIAL DRAW      Allens test (pass/fail) PASS  PASS    PROTIME-INR     Status: Abnormal   Collection Time   05/24/11  3:51 AM      Component Value Range Comment   Prothrombin Time 23.2 (*) 11.6 - 15.2 (seconds)    INR 2.02 (*) 0.00 - [id number]    BASIC METABOLIC PANEL     Status: Abnormal   Collection Time   05/24/11  3:51 AM      Component Value Range Comment   Sodium 133 (*) 135 - 145 (mEq/L)    Potassium 3.4 (*) 3.5 - 5.1 (mEq/L) DELTA CHECK NOTED   Chloride 93 (*) 96 - 112 (mEq/L)    CO2 28  19 - 32 (mEq/L)    Glucose, Bld 338 (*) 70 - 99 (mg/dL)    BUN 21  6 - 23 (mg/dL)    Creatinine, Ser 0.97  0.50 - 1.10 (mg/dL)    Calcium 8.4  8.4 - 10.5 (mg/dL)    GFR calc non Af Amer 58 (*) >90 (mL/min)    GFR calc Af Amer 68 (*) >90 (mL/min)   CBC     Status: Abnormal   Collection Time   05/24/11  3:51 AM      Component Value Range Comment   WBC 12.9 (*) 4.0 - 10.5 (K/uL)    RBC 4.35  3.87 -  5.11 (MIL/uL)    Hemoglobin 12.4  12.0 - 15.0 (g/dL)    HCT 37.9  36.0 - 46.0 (%)    MCV 87.1  78.0 - 100.0 (fL)    MCH 28.5  26.0 - 34.0 (pg)    MCHC 32.7  30.0 - 36.0 (g/dL)    RDW 14.4  11.5 - 15.5 (%)    Platelets 308  150 - 400 (K/uL)   GLUCOSE, CAPILLARY     Status: Abnormal   Collection Time   05/24/11  3:56 AM      Component Value Range Comment   Glucose-Capillary 261 (*) 70 - 99 (mg/dL)   GLUCOSE,  CAPILLARY     Status: Abnormal   Collection Time   05/24/11  7:54 AM      Component Value Range Comment   Glucose-Capillary 280 (*) 70 - 99 (mg/dL)   PROTIME-INR     Status: Abnormal   Collection Time   05/24/11 11:20 AM      Component Value Range Comment   Prothrombin Time 16.7 (*) 11.6 - 15.2 (seconds)    INR 1.33  0.00 - 1.49    GLUCOSE, CAPILLARY     Status: Abnormal   Collection Time   05/24/11 11:46 AM      Component Value Range Comment   Glucose-Capillary 230 (*) 70 - 99 (mg/dL)   GLUCOSE, CAPILLARY     Status: Abnormal   Collection Time   05/24/11  3:14 PM      Component Value Range Comment   Glucose-Capillary 183 (*) 70 - 99 (mg/dL)   GLUCOSE, CAPILLARY     Status: Abnormal   Collection Time   05/24/11  7:36 PM      Component Value Range Comment   Glucose-Capillary 123 (*) 70 - 99 (mg/dL)   PHENYTOIN LEVEL, TOTAL     Status: Abnormal   Collection Time   05/24/11 11:00 PM      Component Value Range Comment   Phenytoin Lvl 6.8 (*) 10.0 - 20.0 (ug/mL)   GLUCOSE, CAPILLARY     Status: Abnormal   Collection Time   05/24/11 11:10 PM      Component Value Range Comment   Glucose-Capillary 134 (*) 70 - 99 (mg/dL)   GLUCOSE, CAPILLARY     Status: Abnormal   Collection Time   05/25/11  4:34 AM      Component Value Range Comment   Glucose-Capillary 211 (*) 70 - 99 (mg/dL)   PROTIME-INR     Status: Abnormal   Collection Time   05/25/11  4:36 AM      Component Value Range Comment   Prothrombin Time 17.2 (*) 11.6 - 15.2 (seconds)    INR 1.38  0.00 - [id number]    BASIC METABOLIC PANEL     Status: Abnormal   Collection Time   05/25/11  4:36 AM      Component Value Range Comment   Sodium 135  135 - 145 (mEq/L)    Potassium 2.4 (*) 3.5 - 5.1 (mEq/L)    Chloride 94 (*) 96 - 112 (mEq/L)    CO2 28  19 - 32 (mEq/L)    Glucose, Bld 222 (*) 70 - 99 (mg/dL)    BUN 18  6 - 23 (mg/dL)    Creatinine, Ser 0.99  0.50 - 1.10 (mg/dL)    Calcium 8.2 (*) 8.4 - 10.5 (mg/dL)    GFR calc non Af Amer  57 (*) >90 (mL/min)    GFR calc  Af Amer 66 (*) >90 (mL/min)   CBC     Status: Abnormal   Collection Time   05/25/11  4:36 AM      Component Value Range Comment   WBC 10.6 (*) 4.0 - 10.5 (K/uL)    RBC 4.09  3.87 - 5.11 (MIL/uL)    Hemoglobin 11.8 (*) 12.0 - 15.0 (g/dL)    HCT 35.9 (*) 36.0 - 46.0 (%)    MCV 87.8  78.0 - 100.0 (fL)    MCH 28.9  26.0 - 34.0 (pg)    MCHC 32.9  30.0 - 36.0 (g/dL)    RDW 14.6  11.5 - 15.5 (%)    Platelets 260  150 - 400 (K/uL)   BLOOD GAS, ARTERIAL     Status: Abnormal   Collection Time   05/25/11  5:20 AM      Component Value Range Comment   FIO2 0.40      Delivery systems VENTILATOR      VT 480      Rate 18      Peep/cpap 5.0      pH, Arterial 7.555 (*) 7.350 - 7.400     pCO2 arterial 33.7 (*) 35.0 - 45.0 (mmHg)    pO2, Arterial 63.7 (*) 80.0 - 100.0 (mmHg)    Bicarbonate 29.2 (*) 20.0 - 24.0 (mEq/L)    TCO2 30.1  0 - 100 (mmol/L)    Acid-Base Excess 6.8 (*) 0.0 - 2.0 (mmol/L)    O2 Saturation 92.8      Patient temperature 102.8      Collection site A-LINE      Drawn by [phone]      Sample type ARTERIAL DRAW       Recent Results (from the past 240 hour(s))  CULTURE, BLOOD (ROUTINE X 2)     Status: Normal   Collection Time   05/17/11  7:33 PM      Component Value Range Status Comment   Specimen Description BLOOD LEFT ARM   Final    Special Requests     Final    Value: BOTTLES DRAWN AEROBIC AND ANAEROBIC 10CC AEROBIC 8CC ANAEROBIC   Culture  Setup Time [id number]   Final    Culture NO GROWTH 5 DAYS   Final    Report Status 05/24/2011 FINAL   Final   CULTURE, BLOOD (ROUTINE X 2)     Status: Normal   Collection Time   05/17/11  7:38 PM      Component Value Range Status Comment   Specimen Description BLOOD LEFT ARM   Final    Special Requests     Final    Value: BOTTLES DRAWN AEROBIC AND ANAEROBIC 10CC AEROBIC 8CC ANAEROBIC   Culture  Setup Time [id number]   Final    Culture NO GROWTH 5 DAYS   Final    Report Status 05/24/2011 FINAL    Final   URINE CULTURE     Status: Normal   Collection Time   05/18/11  3:02 AM      Component Value Range Status Comment   Specimen Description URINE, RANDOM   Final    Special Requests NONE   Final    Culture  Setup Time [id number]   Final    Colony Count >=100,000 COLONIES/ML   Final    Culture ESCHERICHIA COLI   Final    Report Status 05/20/2011 FINAL   Final    Organism ID, Bacteria ESCHERICHIA COLI   Final   URINE  CULTURE     Status: Normal   Collection Time   05/22/11 10:56 AM      Component Value Range Status Comment   Specimen Description URINE, CATHETERIZED   Final    Special Requests NONE   Final    Culture  Setup Time [id number]   Final    Colony Count 45,000 COLONIES/ML   Final    Culture ESCHERICHIA COLI   Final    Report Status 05/24/2011 FINAL   Final    Organism ID, Bacteria ESCHERICHIA COLI   Final   MRSA PCR SCREENING     Status: Normal   Collection Time   05/24/11 12:20 AM      Component Value Range Status Comment   MRSA by PCR NEGATIVE  NEGATIVE  Final     Lipid Panel No results found for this basename: CHOL,TRIG,HDL,CHOLHDL,VLDL,LDLCALC in the last 72 hours  Studies/Results: Ct Head Wo Contrast  05/24/2011  *RADIOLOGY REPORT*  Clinical Data: New seizure activity  CT HEAD WITHOUT CONTRAST  Technique:  Contiguous axial images were obtained from the base of the skull through the vertex without contrast.  Comparison: 05/16/2011 Correlation:  MRI brain 05/17/2011  Findings: Normal ventricular morphology. No midline shift or mass effect. Minimal small vessel chronic ischemic changes of deep cerebral white matter. Large area of hypoattenuation identified medial aspect of the left temporal lobe increased in size since previous exam. Remaining brain parenchyma normal appearance. No intracranial hemorrhage or additional infarction identified. No definite extra-axial fluid collection. Visualized paranasal sinuses and mastoid air cells clear. Bones demineralized.   IMPRESSION: Minimal small vessel chronic ischemic changes of deep cerebral white matter. 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 Findings are progressive since previous study.  Original Report Authenticated By: Burnetta Sabin, M.D.   Dg Chest Port 1 View  05/24/2011  *RADIOLOGY REPORT*  Clinical Data: Intubation  PORTABLE CHEST - 1 VIEW  Comparison: Portable exam 0114 hours compared to 05/16/2011  Findings: Tip of endotracheal tube 4.1 cm above carina. Enlargement of cardiac silhouette. Mediastinal contours and pulmonary vascularity normal. Atherosclerotic calcification aorta. Bibasilar atelectasis. Lungs otherwise clear. Bones unremarkable. No pneumothorax.  IMPRESSION: Enlargement of cardiac silhouette. Bibasilar atelectasis. Satisfactory endotracheal tube position.  Original Report Authenticated By: Burnetta Sabin, M.D.    Medications:  I have reviewed the patient's current medications. Scheduled:   . acyclovir  10 mg/kg (Ideal) Intravenous Q8H  . amiodarone  150 mg Intravenous Once  . antiseptic oral rinse  15 mL Mouth Rinse QID  . aspirin EC  81 mg Oral Daily  . atorvastatin  10 mg Oral q1800  . calcium-vitamin D  1 tablet Oral Q breakfast  . cefTRIAXone (ROCEPHIN)  IV  1 g Intravenous Q24H  . chlorhexidine  15 mL Mouth Rinse BID  . diltiazem  120 mg Oral Daily  . docusate sodium  100 mg Oral BID  . fosPHENYtoin (CEREBYX) IV  500 mg PE Intravenous Once  . insulin aspart  0-7 Units Subcutaneous Q4H  . LORazepam      . LORazepam  1 mg Intravenous Once  . LORazepam  2 mg Intravenous Once  . metoprolol  2.5 mg Intravenous Q12H  . mulitivitamin with minerals  1 tablet Oral Daily  . pantoprazole (PROTONIX) IV  40 mg Intravenous Daily  . phenytoin (DILANTIN) IV  100  mg Intravenous Q8H  . potassium chloride  40 mEq Per  Tube Q1 Hr x 2  . propofol      . simvastatin  20 mg Oral QHS  . sodium chloride  1,000 mL Intravenous Once  . DISCONTD: insulin aspart  0-7 Units Subcutaneous Q4H  . DISCONTD: insulin aspart  0-9 Units Subcutaneous Q4H  . 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  LP attempted unsuccessfully.   Plan:  1.  LP under fluoro today with fluid to be sent for routine cell count, glucose, protein, culture studies and pcr analysis             2.  Continue Acyclovir Seizure (05/24/2011)   Assessment: Patient with seizure activity overnight.  DiIantin level augmented.     Plan: 1.  EEG            2.  Will follow up Dilantin level    LOS: 3 days   Alexis Goodell, MD [facility] [phone] 05/25/2011  7:53 AM

## 2011-05-25 NOTE — Procedures (Signed)
EEG NUMBER:  REFERRING PHYSICIAN:  Norlene Campbell, MD  HISTORY:  A 71 year old female with altered mental status and right focal seizures.  MEDICATIONS:  Zovirax, amiodarone, Precedex, NovoLog, Lantus, Ativan, Rocephin, Dilantin, potassium, diltiazem, Colace, Lopressor, Protonix, and Zocor.  CONDITIONS OF RECORDING:  This is a 16-channel EEG carried out with the patient in the unresponsive state.  DESCRIPTION:  The background activity is asymmetric.  Over the left hemisphere, there is noted PLEDS activity with 1-2 seconds of sharp activity alternating with 5-10 seconds of attenuated background activity.  This is persistent during the tracing.  Over the right hemisphere, the patient has a polymorphic delta activity with frequently superimposed well-organized rhythmic beta activity.  This activity was sustained throughout the tracing over the right hemisphere. Hypoventilation and intermittent photic stimulation were not performed. Painful stimuli were given to the patient.  No activation of the background activity was seen with stimulation.  IMPRESSION:  This is an abnormal EEG secondary to left PLEDS activity. 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          ______________________________ Alexis Goodell, MD    [id number] D:  05/25/2011 18:07:54  T:  05/25/2011 22:53:00  Job #:  [id number]

## 2011-05-25 NOTE — Telephone Encounter (Signed)
Patients husband called to cancel patient appointment with [place] as well as [facility]. He states she has been in the hospital for several days with Seizures. He wanted me to let you know that he would reschedule as soon as they could

## 2011-05-25 NOTE — Progress Notes (Signed)
INITIAL ADULT NUTRITION ASSESSMENT Date: 05/25/2011   Time: 10:26 AM  Reason for Assessment: Low Braden  ASSESSMENT: Female 71 y.o.  Dx: Toxic metabolic encephalopathy  Hx:  Past Medical History  Diagnosis Date  . Blindness of left eye   . Eye muscle weakness     Right eye weakness after cataract surgery  . Common migraine     ?hx of  . DIABETES MELLITUS, TYPE II 03/27/2006  . HYPERLIPIDEMIA 03/27/2006  . 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 HYPERTENSION 03/27/2006  . Atrial fibrillation     SVT dx 2007, cath 2007 mild CAD, then had an cardioversion, ablation; still on coumadin , has occ palpitation, EKG 03-2010 NSR  . Headache 05/07/2009  . LUNG NODULE 09/01/2006    Excision, Bx Benighn  . Osteopenia 2004    Dexa 2004 showed Osteopenia, DEXA 03/2007 normal    Related Meds:     . acyclovir  10 mg/kg (Ideal) Intravenous Q8H  . amiodarone  150 mg Intravenous Once  . antiseptic oral rinse  15 mL Mouth Rinse QID  . aspirin EC  81 mg Oral Daily  . atorvastatin  10 mg Oral q1800  . calcium-vitamin D  1 tablet Oral Q breakfast  . cefTRIAXone (ROCEPHIN)  IV  1 g Intravenous Q24H  . chlorhexidine  15 mL Mouth Rinse BID  . diltiazem  120 mg Oral Daily  . docusate sodium  100 mg Oral BID  . fosPHENYtoin (CEREBYX) IV  500 mg PE Intravenous Once  . LORazepam      . LORazepam  1 mg Intravenous Once  . LORazepam  2 mg Intravenous Once  . metoprolol  2.5 mg Intravenous Q12H  . mulitivitamin with minerals  1 tablet Oral Daily  . pantoprazole (PROTONIX) IV  40 mg Intravenous Daily  . phenytoin (DILANTIN) IV  100 mg Intravenous Q8H  . potassium chloride  40 mEq Per Tube Q1 Hr x 2  . propofol      . simvastatin  20 mg Oral QHS  . sodium chloride  1,000 mL Intravenous Once  . DISCONTD: insulin aspart  0-7 Units Subcutaneous Q4H  . DISCONTD: potassium chloride  10 mEq Intravenous Q1 Hr x 6     Ht: 5\' 8"  (172.7 cm)  Wt: 210 lb 1.6 oz (95.3 kg)  Ideal Wt: 63.6 kg % Ideal Wt:  150%  Usual Wt: 210 lb per family report % Usual Wt: 100%  Body mass index is 31.95 kg/(m^2). Class I Obesity  Food/Nutrition Related Hx:   Patient's family reported weight have been relatively stable, and denied any difficulty swallowing prior to admission. Patient's appetite has been good, eating three meals day, and performed majority of cooking at home for husband and grandchild. Eats a relatively healthy diet for DM control, with the occasional sweets.   Had been on Heart Healthy diet on 3/23 with 80% PO intake per doc flowsheets, but was placed NPO after two seizures on 3/24. Was evaluated by SLP for BSE. Unable to perform due to intubation, and will continue to follow  Is being repleted with potassium for hypokalemic event on 3/25. Patient has persistent fever. No BMs or wound to document  Labs:  CMP     Component Value Date/Time   NA 135 05/25/2011 0436   K 2.4* 05/25/2011 0436   CL 94* 05/25/2011 0436   CO2 28 05/25/2011 0436   GLUCOSE 222* 05/25/2011 0436   BUN 18 05/25/2011 0436   CREATININE 0.99 05/25/2011 0436  CALCIUM 8.2* 05/25/2011 0436   PROT 7.1 05/22/2011 1015   ALBUMIN 2.9* 05/22/2011 1015   AST 26 05/22/2011 1015   ALT 21 05/22/2011 1015   ALKPHOS 74 05/22/2011 1015   BILITOT 0.5 05/22/2011 1015   GFRNONAA 57* 05/25/2011 0436   GFRAA 66* 05/25/2011 0436   CBG (last 3)   Basename 05/25/11 0804 05/25/11 0434 05/24/11 2310  GLUCAP 253* 211* 134*   Lab Results  Component Value Date   HGB 11.8* 05/25/2011    Intake:   Intake/Output Summary (Last 24 hours) at 05/25/11 1026 Last data filed at 05/25/11 1000  Gross per 24 hour  Intake 1534.05 ml  Output   1995 ml  Net -460.95 ml    Diet Order: NPO  Supplements/Tube Feeding: N/A  IVF:     sodium chloride Last Rate: 20 mL/hr at 05/25/11 0829  amiodarone (NEXTERONE PREMIX) 360 mg/200 mL dextrose Last Rate: 60 mg/hr (05/24/11 2202)  And   amiodarone (NEXTERONE PREMIX) 360 mg/200 mL dextrose Last Rate: 30  mg/hr (05/25/11 0900)  dexmedetomidine (PRECEDEX) IV infusion Last Rate: Stopped (05/24/11 0830)  dextrose   dextrose 5 % and 0.9% NaCl Last Rate: 20 mL/hr at 05/23/11 2248  insulin (NOVOLIN-R) infusion Last Rate: 4.4 mL/hr at 05/25/11 0933    Estimated Nutritional Needs:   [id number] - 1975 (Goal: 1125-1325 kcal) Protein:127- 137 gm Fluid: > 1.8 L   Per APSEN permissive underfeeding guidelines where BMI is > 30, goal of EN regimen should provide 60-70% of estimated energy requirements (22-25 kcals/IBW, 11 - 14 kcals/actual body weight).   Noted per family report, if able to advance to diet, family stated a concern for patient's PO intake while at the hospital would be her general "dislike of hospital food's and lack of seasonings".   NUTRITION DIAGNOSIS: -Inadequate oral intake (NI-2.1).  Status: Ongoing  RELATED TO: inability to eat  AS EVIDENCE BY: NPO status  MONITORING/EVALUATION(Goals): Goal:  If unable to extubated within 24-48 hours, and EN is warranted, recommend initiation of EN. Goal EN to provide 60-70% estimated energy needs, and 100% protein needs per APSEN permissive underfeeding guidelines.  If able to consume PO intake, consume >/= 90% of estimated needs  Monitor: Extubation, temperature, vent settings, I/Os, weights, labs, SLP evaluation for possible dysphagia, PO intake if diet advancement  EDUCATION NEEDS: -No education needs identified at this time  INTERVENTION:  If patient unable to extubate within 48 hours, and EN is warranted, recommend initiating Glucerna 1.2 at 20 ml/hr via feeding tube. Increase 10 ml/hr every four hours to goal rate of 30 ml/hr. Supplement 30 ml Prostat six times daily.   Overall this regimen will provide 1296 kcal (69% of estimated energy needs), 133 gram protein (100% estimated protein needs), and 580 ml free water  RD to follow nutrition care plan  Dietitian #[phone]  DOCUMENTATION CODES Per approved criteria  -Obesity  Unspecified    Atlee Abide 05/25/2011, 10:26 AM  Asencion Partridge Pager #: [phone]

## 2011-05-25 NOTE — Progress Notes (Signed)
Patient name: [name] [name] Medical record number: [id number] Date of birth: 12-27-1940 Age: 71 y.o. Gender: female PCP: [name], MD, MD  PCCM PROGRESS NOTE   Date: 05/25/2011 Reason for Consult: respiratory failure Referring Physician: Marye Round  HPI:  71 y/o female with new left temporal lobe lesion, seizures and delirium who was intubated for airway protection on 3/24 after seizures and receiving benzodiazepines.  She had recently been hospitalized at [facility] from 3/16 through 3/19 for similar issues.  Lines/tubes ETT 3/24>>> Foley 3/24>>>  Culture data/sepsis markers 3/18 UCx: e.coli pan sensitive 3/23 UCx E. coli  Antibiotics Acyclovir 3/24>>> Ceftriaxone 3/22>>>  Best practice GI ppx: PPI DVT ppx: SCD (INR elevated)    Events/studies 3/16 noncontrast CT brain: New cortical hypodensity at left temporal lobe 3/16 MRI: Abnormal signal at left hippocampus and right temporal lobe. Consistent with HSV, glioma, paraneoplastic syndrome. The left hippocampus lesion is consistent also with seizure focus. 3/24 CT head: enlarging area of low attenuation medial left temporal lobe 3/25 LP:  980 wbc/cu mm, 94% lymphocytes, 2% segs  Temp:  [98.5 F (36.9 C)-102.8 F (39.3 C)] 98.5 F (36.9 C) (03/25 1542) Pulse Rate:  [54-135] 58  (03/25 1500) Resp:  [10-22] 16  (03/25 1500) BP: (103-197)/(32-84) 144/44 mmHg (03/25 1500) SpO2:  [83 %-100 %] 100 % (03/25 1500) Arterial Line BP: (164-240)/(46-61) 164/46 mmHg (03/25 0700) FiO2 (%):  [30 %-40 %] 30.2 % (03/25 1500) Weight:  [95.3 kg (210 lb 1.6 oz)] 95.3 kg (210 lb 1.6 oz) (03/25 0600)    Intake/Output Summary (Last 24 hours) at 05/25/11 1610 Last data filed at 05/25/11 1441  Gross per 24 hour  Intake 1503.31 ml  Output   1410 ml  Net  93.31 ml    Physical exam Gen: responds to sternal rub. No spontaneous eye opening, W/Ds all 4 ext's HEENT: NCAT, ETT in place, pinpoint but equal pupils PULM: CTA B CV: RRR, no mgr AB:  BS+, soft, nontender, no hsm Ext: warm, trace edema    Vent Mode:  [-] PRVC FiO2 (%):  [30 %-40 %] 30.2 % Set Rate:  [14 bmp-18 bmp] 14 bmp Vt Set:  [480 mL] 480 mL PEEP:  [5 cmH20] 5 cmH20 Plateau Pressure:  [16 cmH20-17 cmH20] 17 cmH20  LAB RESULT Lab Results  Component Value Date   CREATININE 0.99 05/25/2011   BUN 18 05/25/2011   NA 135 05/25/2011   K 2.4* 05/25/2011   CL 94* 05/25/2011   CO2 28 05/25/2011   Lab Results  Component Value Date   WBC 10.6* 05/25/2011   HGB 11.8* 05/25/2011   HCT 35.9* 05/25/2011   MCV 87.8 05/25/2011   PLT 260 05/25/2011    Lab Results  Component Value Date   INR 1.38 05/25/2011   INR 1.33 05/24/2011   INR 2.02* 05/24/2011   PROTIME 19.0 07/26/2008     Assessment and Plan  71 y/o female with new left temporal lobe lesion, seizures and delirium who was intubated for airway protection on 3/24 after seizures and receiving benzodiazepines.  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 Neuro and ID following -Cont Acyclovir  UTI: 3/18 pan sensitive E.coli -Cont ceftriaxone  PAF >> Converted 3/25 AM to NSR, sinus brady with prolonged QT -amiodarone gtt started 3/24 >. D/C'd 3/25 -PRN metoprolol for tachycardia  Htn - PRN hydralazine  DM2 -transition from insulin gtt  to SSI plus Lantus  Seizure: likely due to L temporal lobe lesions -Cont dilantin per Neurology  DVT prophylaxis: SCDs.      CC time today 45 minutes  Merton Border 05/25/2011, 4:10 PM

## 2011-05-25 NOTE — Progress Notes (Signed)
Portable EEG completed w/ video.

## 2011-05-25 NOTE — Progress Notes (Signed)
eLink Physician-Brief Progress Note Patient Name: [name] [name] DOB: 16-Jun-1940 MRN: [id number]  Date of Service  05/25/2011   HPI/Events of Note  Hypertension with BP 203/56 - received prn of hydralazine with no effect   eICU Interventions  Plan: Labetalol 10 mg Iv q2 hours prn systolic BP greater than 0000000 mmHg   Intervention Category Intermediate Interventions: Hypertension - evaluation and management  [name] 05/25/2011, 12:31 AM

## 2011-05-25 NOTE — Procedures (Signed)
Arterial Catheter Insertion Procedure Note [name] [name] [id number] 1940/08/01  Procedure: Insertion of Arterial Catheter  Indications: Blood pressure monitoring  Procedure Details Consent: Unable to obtain consent because of altered level of consciousness. Time Out: Verified patient identification, verified procedure, site/side was marked, verified correct patient position, special equipment/implants available, medications/allergies/relevent history reviewed, required imaging and test results available.  Performed  Maximum sterile technique was used including antiseptics, gloves, gown, hand hygiene and sheet. Skin prep: Chlorhexidine; local anesthetic administered 20 gauge catheter was inserted into right radial artery using the Seldinger technique.  Evaluation Blood flow good; BP tracing good. Complications: No apparent complications.   [name] [facility] 05/25/2011

## 2011-05-25 NOTE — Progress Notes (Signed)
Speech Language Pathology Treatment  Pt. Remains intubated. ST will sign off.  Please reorder if/when appropriate.  [name] [name] [place].[employer] [phone]  05/25/2011

## 2011-05-25 NOTE — Progress Notes (Signed)
eLink Physician-Brief Progress Note Patient Name: [name] [name] DOB: 12-21-1940 MRN: [id number]  Date of Service  05/25/2011   HPI/Events of Note  Hypokalemia   eICU Interventions  Potassium replaced   Intervention Category Intermediate Interventions: Electrolyte abnormality - evaluation and management  Medina Degraffenreid 05/25/2011, 5:45 AM

## 2011-05-26 ENCOUNTER — Telehealth (HOSPITAL_COMMUNITY): Payer: Self-pay | Admitting: Radiology

## 2011-05-26 ENCOUNTER — Inpatient Hospital Stay (HOSPITAL_COMMUNITY): Payer: Medicare Other

## 2011-05-26 ENCOUNTER — Ambulatory Visit: Payer: Medicare Other | Admitting: Internal Medicine

## 2011-05-26 DIAGNOSIS — G40309 Generalized idiopathic epilepsy and epileptic syndromes, not intractable, without status epilepticus: Secondary | ICD-10-CM

## 2011-05-26 DIAGNOSIS — J9600 Acute respiratory failure, unspecified whether with hypoxia or hypercapnia: Secondary | ICD-10-CM

## 2011-05-26 DIAGNOSIS — Z9911 Dependence on respirator [ventilator] status: Secondary | ICD-10-CM

## 2011-05-26 DIAGNOSIS — B004 Herpesviral encephalitis: Secondary | ICD-10-CM

## 2011-05-26 LAB — HERPES SIMPLEX VIRUS(HSV) DNA BY PCR: HSV 1 DNA: DETECTED

## 2011-05-26 LAB — BLOOD GAS, ARTERIAL
Acid-Base Excess: 3.8 mmol/L — ABNORMAL HIGH (ref 0.0–2.0)
Bicarbonate: 26.6 mEq/L — ABNORMAL HIGH (ref 20.0–24.0)
FIO2: 0.3 %
O2 Saturation: 97.3 %
RATE: 14 resp/min
TCO2: 27.6 mmol/L (ref 0–100)
pCO2 arterial: 31.8 mmHg — ABNORMAL LOW (ref 35.0–45.0)
pO2, Arterial: 83.8 mmHg (ref 80.0–100.0)

## 2011-05-26 LAB — GLUCOSE, CAPILLARY
Glucose-Capillary: 109 mg/dL — ABNORMAL HIGH (ref 70–99)
Glucose-Capillary: 141 mg/dL — ABNORMAL HIGH (ref 70–99)
Glucose-Capillary: 146 mg/dL — ABNORMAL HIGH (ref 70–99)
Glucose-Capillary: 155 mg/dL — ABNORMAL HIGH (ref 70–99)
Glucose-Capillary: 168 mg/dL — ABNORMAL HIGH (ref 70–99)

## 2011-05-26 LAB — PROTIME-INR
INR: 1.63 — ABNORMAL HIGH (ref 0.00–1.49)
Prothrombin Time: 19.6 seconds — ABNORMAL HIGH (ref 11.6–15.2)

## 2011-05-26 LAB — BASIC METABOLIC PANEL
Chloride: 103 mEq/L (ref 96–112)
GFR calc Af Amer: 69 mL/min — ABNORMAL LOW (ref >90)
Potassium: 3.4 mEq/L — ABNORMAL LOW (ref 3.5–5.1)
Sodium: 139 mEq/L (ref 135–145)

## 2011-05-26 LAB — PROCALCITONIN: Procalcitonin: 0.1 ng/mL

## 2011-05-26 LAB — GRAM STAIN

## 2011-05-26 LAB — CBC
HCT: 35.3 % — ABNORMAL LOW (ref 36.0–46.0)
Hemoglobin: 11.8 g/dL — ABNORMAL LOW (ref 12.0–15.0)
RDW: 14.8 % (ref 11.5–15.5)
WBC: 10.3 10*3/uL (ref 4.0–10.5)

## 2011-05-26 LAB — PHENYTOIN LEVEL, TOTAL: Phenytoin Lvl: 5.9 ug/mL — ABNORMAL LOW (ref 10.0–20.0)

## 2011-05-26 MED ORDER — MIDAZOLAM HCL 2 MG/2ML IJ SOLN
1.0000 mg | INTRAMUSCULAR | Status: DC | PRN
  Administered 2011-05-26 (×3): 2 mg via INTRAVENOUS
  Filled 2011-05-26 (×2): qty 2

## 2011-05-26 MED ORDER — JEVITY 1.2 CAL PO LIQD
1000.0000 mL | ORAL | Status: DC
  Administered 2011-05-27: 1000 mL
  Filled 2011-05-26 (×4): qty 1000

## 2011-05-26 MED ORDER — SODIUM CHLORIDE 0.9 % IV SOLN
INTRAVENOUS | Status: DC
  Administered 2011-05-26: 14:00:00 via INTRAVENOUS

## 2011-05-26 MED ORDER — FENTANYL CITRATE 0.05 MG/ML IJ SOLN
25.0000 ug | INTRAMUSCULAR | Status: DC | PRN
  Administered 2011-05-26 (×3): 50 ug via INTRAVENOUS
  Filled 2011-05-26 (×2): qty 2

## 2011-05-26 MED ORDER — SODIUM CHLORIDE 0.9 % IV SOLN
500.0000 mg | Freq: Once | INTRAVENOUS | Status: AC
  Administered 2011-05-26: 500 mg via INTRAVENOUS
  Filled 2011-05-26: qty 10

## 2011-05-26 MED ORDER — FENTANYL CITRATE 0.05 MG/ML IJ SOLN
INTRAMUSCULAR | Status: AC
  Administered 2011-05-26: 50 ug via INTRAVENOUS
  Filled 2011-05-26: qty 2

## 2011-05-26 MED ORDER — POTASSIUM CHLORIDE 20 MEQ/15ML (10%) PO LIQD
40.0000 meq | Freq: Once | ORAL | Status: AC
  Administered 2011-05-26: 40 meq
  Filled 2011-05-26: qty 30

## 2011-05-26 MED ORDER — MIDAZOLAM HCL 2 MG/2ML IJ SOLN
INTRAMUSCULAR | Status: AC
  Administered 2011-05-26: 2 mg via INTRAVENOUS
  Filled 2011-05-26: qty 2

## 2011-05-26 MED ORDER — SODIUM CHLORIDE 0.9 % IV SOLN
150.0000 mg | Freq: Three times a day (TID) | INTRAVENOUS | Status: DC
  Administered 2011-05-26 – 2011-05-28 (×5): 150 mg via INTRAVENOUS
  Filled 2011-05-26 (×10): qty 3

## 2011-05-26 MED ORDER — MIDAZOLAM HCL 2 MG/2ML IJ SOLN
2.0000 mg | INTRAMUSCULAR | Status: DC | PRN
  Administered 2011-05-26 – 2011-05-27 (×4): 2 mg via INTRAVENOUS
  Filled 2011-05-26 (×4): qty 2

## 2011-05-26 MED ORDER — FENTANYL CITRATE 0.05 MG/ML IJ SOLN
50.0000 ug | INTRAMUSCULAR | Status: DC | PRN
  Administered 2011-05-26 – 2011-05-27 (×4): 50 ug via INTRAVENOUS
  Filled 2011-05-26 (×4): qty 2

## 2011-05-26 NOTE — Progress Notes (Signed)
Patient name: [name] [name] Medical record number: [id number] Date of birth: 05-03-1940 Age: 71 y.o. Gender: female PCP: [name], MD, MD  PCCM PROGRESS NOTE   Date: 05/26/2011 Reason for Consult: respiratory failure Referring Physician: Marye Round  HPI:  71 y/o female with new left temporal lobe lesion, seizures and delirium who was intubated for airway protection on 3/24 after seizures and receiving benzodiazepines.  She had recently been hospitalized at [facility] from 3/16 through 3/19 for similar issues.  Lines/tubes ETT 3/24>>>  Culture data/sepsis markers 3/18 UCx: e.coli pan sensitive 3/23 UCx E. coli  Antibiotics Acyclovir 3/24>>> Ceftriaxone 3/22>>>3/25  Best practice GI ppx: PPI DVT ppx: SCD (INR elevated)   Events/studies 3/16 noncontrast CT brain: New cortical hypodensity at left temporal lobe 3/16 MRI: Abnormal signal at left hippocampus and right temporal lobe. Consistent with HSV, glioma, paraneoplastic syndrome. The left hippocampus lesion is consistent also with seizure focus. 3/24 CT head: enlarging area of low attenuation medial left temporal lobe 3/25 LP:  980 wbc/cu mm, 94% lymphocytes, 2% segs   SUBJ: More responsive. Not F/C  Temp:  [98.3 F (36.8 C)-99.8 F (37.7 C)] 98.3 F (36.8 C) (03/26 1602) Pulse Rate:  [38-74] 70  (03/26 1530) Resp:  [12-25] 15  (03/26 1530) BP: (106-194)/(38-70) 133/47 mmHg (03/26 1530) SpO2:  [97 %-100 %] 99 % (03/26 1530) Arterial Line BP: (90-172)/(55-72) 136/58 mmHg (03/26 0700) FiO2 (%):  [29.8 %-30.3 %] 29.9 % (03/26 1530) Weight:  [96.4 kg (212 lb 8.4 oz)] 96.4 kg (212 lb 8.4 oz) (03/26 0400)    Intake/Output Summary (Last 24 hours) at 05/26/11 1608 Last data filed at 05/26/11 1544  Gross per 24 hour  Intake 1450.55 ml  Output   1173 ml  Net 277.55 ml    Physical exam Gen: Grimaces, no spont eye opening but responds to voice, W/Ds all 4 ext's HEENT: NCAT, ETT in place PULM: CTA B CV: RRR, no mgr AB: BS+,  soft, nontender, no hsm Ext: warm, trace edema    Vent Mode:  [-] PRVC FiO2 (%):  [29.8 %-30.3 %] 29.9 % Set Rate:  [14 bmp] 14 bmp Vt Set:  [480 mL] 480 mL PEEP:  [5 cmH20] 5 cmH20 Pressure Support:  [10 cmH20] 10 cmH20 Plateau Pressure:  [15 cmH20-18 cmH20] 15 cmH20  LAB RESULT Lab Results  Component Value Date   CREATININE 0.95 05/26/2011   BUN 17 05/26/2011   NA 139 05/26/2011   K 3.4* 05/26/2011   CL 103 05/26/2011   CO2 29 05/26/2011   Lab Results  Component Value Date   WBC 10.3 05/26/2011   HGB 11.8* 05/26/2011   HCT 35.3* 05/26/2011   MCV 87.6 05/26/2011   PLT 233 05/26/2011    Lab Results  Component Value Date   INR 1.63* 05/26/2011   INR 1.38 05/25/2011   INR 1.33 05/24/2011   PROTIME 19.0 07/26/2008     Assessment and Plan  71 y/o female with new left temporal lobe lesion, seizures and delirium who was intubated for airway protection on 3/24 after seizures and receiving benzodiazepines.  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 Neuro and ID following -Cont Acyclovir  UTI: 3/18 pan sensitive E.coli -Cftrx D/C'd   PAF >> Converted 3/25 AM to NSR, sinus brady with prolonged QT -amiodarone gtt started 3/24 >. D/C'd 3/25 -PRN metoprolol for tachycardia  Htn - PRN  hydralazine  DM2 - controlled -Cont SSI plus Lantus  Seizure: likely due to L temporal lobe lesions -Cont dilantin per Neurology     CC time today 30 minutes. Sister updated @ bedside    Merton Border, MD;  PCCM service; [place] [phone]

## 2011-05-26 NOTE — Progress Notes (Signed)
INFECTIOUS DISEASE PROGRESS NOTE    Date of Admission:  05/22/2011   Total days of antibiotics 5        Day 4 acyclovir        Ceftriaxone 3/22-25         Principal Problem:  *Toxic metabolic encephalopathy Active Problems:  DIABETES MELLITUS, TYPE II  HYPERLIPIDEMIA  PERIPHERAL NEUROPATHY  HYPERTENSION  Atrial fibrillation  Hypokalemia  E. coli UTI (urinary tract infection)  Seizure  Acute respiratory failure with hypoxia      . acyclovir  10 mg/kg (Ideal) Intravenous Q8H  . antiseptic oral rinse  15 mL Mouth Rinse QID  . aspirin  81 mg Per Tube Daily  . chlorhexidine  15 mL Mouth Rinse BID  . insulin aspart  0-20 Units Subcutaneous Q4H  . insulin glargine  30 Units Subcutaneous BID  . pantoprazole sodium  40 mg Per Tube Q1200  . phenytoin (DILANTIN) IV  150 mg Intravenous Q8H  . phenytoin (DILANTIN) IV  500 mg Intravenous Once  . potassium chloride  40 mEq Per Tube TID  . potassium chloride  40 mEq Per Tube Once  . DISCONTD: aspirin EC  81 mg Oral Daily  . DISCONTD: atorvastatin  10 mg Oral q1800  . DISCONTD: calcium-vitamin D  1 tablet Oral Q breakfast  . DISCONTD: cefTRIAXone (ROCEPHIN)  IV  1 g Intravenous Q24H  . DISCONTD: diltiazem  120 mg Oral Daily  . DISCONTD: docusate  100 mg Per Tube BID  . DISCONTD: docusate sodium  100 mg Oral BID  . DISCONTD: LORazepam  1 mg Intravenous Once  . DISCONTD: metoprolol  2.5 mg Intravenous Q12H  . DISCONTD: mulitivitamin with minerals  1 tablet Oral Daily  . DISCONTD: pantoprazole (PROTONIX) IV  40 mg Intravenous Daily  . DISCONTD: phenytoin (DILANTIN) IV  100 mg Intravenous Q8H  . DISCONTD: simvastatin  20 mg Oral QHS    Subjective: Patient remains intubated, some responsiveness with sister  Objective: Temp:  [98.4 F (36.9 C)-99.8 F (37.7 C)] 99.2 F (37.3 C) (03/26 0400) Pulse Rate:  [38-64] 62  (03/26 1000) Resp:  [12-25] 12  (03/26 1000) BP: (106-194)/(32-70) 154/55 mmHg (03/26 1000) SpO2:  [97 %-100 %] 99 %  (03/26 1000) Arterial Line BP: (90-172)/(55-72) 136/58 mmHg (03/26 0700) FiO2 (%):  [29.8 %-30.3 %] 29.9 % (03/26 1000) Weight:  [212 lb 8.4 oz (96.4 kg)] 212 lb 8.4 oz (96.4 kg) (03/26 0400)  General: minimal responsiveness Skin: no rashes Lungs: CTA B Cor: RRR  Lab Results Lab Results  Component Value Date   WBC 10.3 05/26/2011   HGB 11.8* 05/26/2011   HCT 35.3* 05/26/2011   MCV 87.6 05/26/2011   PLT 233 05/26/2011    Lab Results  Component Value Date   CREATININE 0.95 05/26/2011   BUN 17 05/26/2011   NA 139 05/26/2011   K 3.4* 05/26/2011   CL 103 05/26/2011   CO2 29 05/26/2011    Lab Results  Component Value Date   ALT 21 05/22/2011   AST 26 05/22/2011   ALKPHOS 74 05/22/2011   BILITOT 0.5 05/22/2011       Microbiology: Recent Results (from the past 240 hour(s))  CULTURE, BLOOD (ROUTINE X 2)     Status: Normal   Collection Time   05/17/11  7:33 PM      Component Value Range Status Comment   Specimen Description BLOOD LEFT ARM   Final    Special Requests     Final  Value: BOTTLES DRAWN AEROBIC AND ANAEROBIC 10CC AEROBIC 8CC ANAEROBIC   Culture  Setup Time [id number]   Final    Culture NO GROWTH 5 DAYS   Final    Report Status 05/24/2011 FINAL   Final   CULTURE, BLOOD (ROUTINE X 2)     Status: Normal   Collection Time   05/17/11  7:38 PM      Component Value Range Status Comment   Specimen Description BLOOD LEFT ARM   Final    Special Requests     Final    Value: BOTTLES DRAWN AEROBIC AND ANAEROBIC 10CC AEROBIC 8CC ANAEROBIC   Culture  Setup Time [id number]   Final    Culture NO GROWTH 5 DAYS   Final    Report Status 05/24/2011 FINAL   Final   URINE CULTURE     Status: Normal   Collection Time   05/18/11  3:02 AM      Component Value Range Status Comment   Specimen Description URINE, RANDOM   Final    Special Requests NONE   Final    Culture  Setup Time [id number]   Final    Colony Count >=100,000 COLONIES/ML   Final    Culture ESCHERICHIA COLI   Final     Report Status 05/20/2011 FINAL   Final    Organism ID, Bacteria ESCHERICHIA COLI   Final   URINE CULTURE     Status: Normal   Collection Time   05/22/11 10:56 AM      Component Value Range Status Comment   Specimen Description URINE, CATHETERIZED   Final    Special Requests NONE   Final    Culture  Setup Time [id number]   Final    Colony Count 45,000 COLONIES/ML   Final    Culture ESCHERICHIA COLI   Final    Report Status 05/24/2011 FINAL   Final    Organism ID, Bacteria ESCHERICHIA COLI   Final   MRSA PCR SCREENING     Status: Normal   Collection Time   05/24/11 12:20 AM      Component Value Range Status Comment   MRSA by PCR NEGATIVE  NEGATIVE  Final     Studies/Results: Dg Chest Port 1 View  05/26/2011  *RADIOLOGY REPORT*  Clinical Data: Follow up respiratory failure  PORTABLE CHEST - 1 VIEW  Comparison: 05/25/11  Findings:  The endotracheal tube tip is stable above the carina.  There is a nasogastric tube looped in the stomach.  There is asymmetric elevation of the right hemidiaphragm.  Atelectasis is noted within both lung bases, right greater than left.  IMPRESSION:  1.  Continued bibasilar atelectasis with asymmetric elevation of the right hemidiaphragm.  Original Report Authenticated By: Angelita Ingles, M.D.   Dg Chest Port 1 View  05/25/2011  *RADIOLOGY REPORT*  Clinical Data: Endotracheal tube evaluation.  PORTABLE CHEST - 1 VIEW  Comparison: 05/24/2011.  Findings: Endotracheal tube is in satisfactory position. Nasogastric tube is followed into the stomach.  Heart size stable. Mild bibasilar air space disease.  Lungs are low in volume.  No definite pleural fluid.  Old bilateral rib fractures.  IMPRESSION: Low lung volumes with mild bibasilar air space disease.  Original Report Authenticated By: Luretha Rued, M.D.   Dg Fluoro Guide Ndl Plc/bx  05/25/2011  *RADIOLOGY REPORT*  Clinical Data:  Encephalitis  DIAGNOSTIC LUMBAR PUNCTURE UNDER FLUOROSCOPIC GUIDANCE  Fluoroscopy  time:  1.3 minutes.  Technique:  Informed consent was obtained from  the patient prior to the procedure, including potential complications of headache, allergy, and pain.   With the patient prone, the lower back was prepped with Betadine.  1% Lidocaine was used for local anesthesia. Lumbar puncture was performed at the rate L3-L4 level using a 20 gauge needle with return of clear yellow tinged  CSF with an opening pressure of 15 cm water.   12 ml of CSF were obtained for laboratory studies.  The patient tolerated the procedure well and there were no apparent complications. The patient is intubated during the procedure.  IMPRESSION: Successful lumbar puncture.  Clear yellow tinged CSF.  Original Report Authenticated By: Suzy Bouchard, M.D.     Assessment: 71 yo with possible HSV encephalitis.  CT noted, progressive since last scan. CSF significant for 980 WBCs with lymphocytic prodominance.  CSF bacterial culture pending but was not obtained sterile and is not consistent with meningitis.  PCR pending. EEG noted.     Plan:  -continue with acyclovir.    Scharlene Gloss, MD

## 2011-05-26 NOTE — Progress Notes (Addendum)
ANTIBIOTIC CONSULT NOTE - FOLLOW UP  Pharmacy Consult for Acyclovir/Dilantin Indication: Herpes encephalitis/seizures  Allergies  Allergen Reactions  . Procaine Hcl Anaphylaxis    Patient Measurements: Height: 5\' 8"  (172.7 cm) Weight: 212 lb 8.4 oz (96.4 kg) IBW/kg (Calculated) : 63.9  Adjusted Body Weight:    Vital Signs: Temp: 99.2 F (37.3 C) (03/26 0400) Temp src: Axillary (03/26 0400) BP: 131/45 mmHg (03/26 0900) Pulse Rate: 58  (03/26 0900) Intake/Output from previous day: 03/25 0701 - 03/26 0700 In: 2198.6 [I.V.:1448.2; NG/GT:90; IV Piggyback:420.4] Out: 1100 [Urine:1100] Intake/Output from this shift: Total I/O In: 72.1 [I.V.:32.1; Other:40] Out: 51 [Urine:70; Emesis/NG output:3]  Labs:  [facility] 05/26/11 0451 05/25/11 0436 05/24/11 0351  WBC 10.3 10.6* 12.9*  HGB 11.8* 11.8* 12.4  PLT 233 260 308  LABCREA -- -- --  CREATININE 0.95 0.99 0.97   Estimated Creatinine Clearance: 67.8 ml/min (by C-G formula based on Cr of 0.95). No results found for this basename: VANCOTROUGH:2,VANCOPEAK:2,VANCORANDOM:2,GENTTROUGH:2,GENTPEAK:2,GENTRANDOM:2,TOBRATROUGH:2,TOBRAPEAK:2,TOBRARND:2,AMIKACINPEAK:2,AMIKACINTROU:2,AMIKACIN:2, in the last 72 hours   Microbiology: Recent Results (from the past 720 hour(s))  CULTURE, BLOOD (ROUTINE X 2)     Status: Normal   Collection Time   05/17/11  7:33 PM      Component Value Range Status Comment   Specimen Description BLOOD LEFT ARM   Final    Special Requests     Final    Value: BOTTLES DRAWN AEROBIC AND ANAEROBIC 10CC AEROBIC 8CC ANAEROBIC   Culture  Setup Time [id number]   Final    Culture NO GROWTH 5 DAYS   Final    Report Status 05/24/2011 FINAL   Final   CULTURE, BLOOD (ROUTINE X 2)     Status: Normal   Collection Time   05/17/11  7:38 PM      Component Value Range Status Comment   Specimen Description BLOOD LEFT ARM   Final    Special Requests     Final    Value: BOTTLES DRAWN AEROBIC AND ANAEROBIC 10CC AEROBIC 8CC  ANAEROBIC   Culture  Setup Time [id number]   Final    Culture NO GROWTH 5 DAYS   Final    Report Status 05/24/2011 FINAL   Final   URINE CULTURE     Status: Normal   Collection Time   05/18/11  3:02 AM      Component Value Range Status Comment   Specimen Description URINE, RANDOM   Final    Special Requests NONE   Final    Culture  Setup Time [id number]   Final    Colony Count >=100,000 COLONIES/ML   Final    Culture ESCHERICHIA COLI   Final    Report Status 05/20/2011 FINAL   Final    Organism ID, Bacteria ESCHERICHIA COLI   Final   URINE CULTURE     Status: Normal   Collection Time   05/22/11 10:56 AM      Component Value Range Status Comment   Specimen Description URINE, CATHETERIZED   Final    Special Requests NONE   Final    Culture  Setup Time [id number]   Final    Colony Count 45,000 COLONIES/ML   Final    Culture ESCHERICHIA COLI   Final    Report Status 05/24/2011 FINAL   Final    Organism ID, Bacteria ESCHERICHIA COLI   Final   MRSA PCR SCREENING     Status: Normal   Collection Time   05/24/11 12:20 AM      Component Value  Range Status Comment   MRSA by PCR NEGATIVE  NEGATIVE  Final     Anti-infectives     Start     Dose/Rate Route Frequency Ordered Stop   05/25/11 1400   acyclovir (ZOVIRAX) 640 mg in dextrose 5 % 100 mL IVPB        10 mg/kg  63.9 kg (Ideal) 112.8 mL/hr over 60 Minutes Intravenous 3 times per day 05/25/11 1035     05/24/11 0030   acyclovir (ZOVIRAX) 500 mg in dextrose 5 % 100 mL IVPB  Status:  Discontinued        10 mg/kg  50.1 kg (Ideal) 110 mL/hr over 60 Minutes Intravenous 3 times per day 05/23/11 2345 05/25/11 1035   05/23/11 1130   cefTRIAXone (ROCEPHIN) 1 g in dextrose 5 % 50 mL IVPB  Status:  Discontinued        1 g 100 mL/hr over 30 Minutes Intravenous Every 24 hours 05/23/11 1041 05/25/11 1906   05/22/11 1315   cefTRIAXone (ROCEPHIN) 1 g in dextrose 5 % 50 mL IVPB        1 g 100 mL/hr over 30 Minutes Intravenous  Once 05/22/11  1303 05/22/11 1346          Assessment: Confusion, nausea and headache on admit.  Anticoagulation: Coumadin for afib -d/c'd 3/23 for LP. INR down to 1.38 now up to 1.63??  s/p Vitamin K 5mg  IV 3/24. LFT's?  Infectious Disease: Was txed previously for possible viral menigitidis, on Acyclovir #3= 640mg /8h- for presumed herpes encephalitis.Lucianne Muss UTI treated with Rocephin. WBC down 10.3. LP unsuccessful.  3/24 Acyclovir (RX)>> 3/22 CTX>>> 3/25  Cardiovascular: Afib, HTN, HLD: Max BP 194/56, HR 38-70 Meds: ASA 81mg , other cardiac meds d/c  Endocrinology: Lantus,ssi. CBGs 109 to 200s. Insulin drip off. Meds: Jevity, SSI, Lantus 30 bid.  Gastrointestinal / Nutrition: Was vomiting PTA and unsure if absorbing coumadin? NPO. Tube PPI K = 2.4 to up 3.4 today: PH 7.55 and pco2 = 33.7.  Neurology: Confusion, worrisome area on MRI of brain; new L temporal lobe lesion. Needs LP. Trying to stop sedation and wean vent.Now on Precedex..No more seizures reported over last night. (Dilantin level 6.8 to 8 to 5.9 this am). Level 5.9 corrects to 10.5 with low albumin.  Nephrology: SrCr 0.99, CrCl~65  Pulmonary: intubated 3/24 s/p seizures, delirium w/ benzos. Precedex  Hematology / Oncology: CBC OK  Best Practices: Coumadin - held; SCDs; tube PPI, mouth care.  Plan: 1. Anticoag? Will leave sticky note. 2. Acyclovir 10mg /kg/8h. Continue. 3. Give 500mg  IV Dilantin bolus due to declining level. Increase maintenance dose to 150mg  TID (5mg /kg/d)  Addalyn Speedy S. Alford Highland, PharmD, [place] Clinical Staff Pharmacist Pager [phone]  05/26/2011,9:50 AM

## 2011-05-26 NOTE — Progress Notes (Signed)
Nutrition Follow Up  Diet order: Patient is currently receiving Jevity 1.2 at 20 ml/hr via OGT to goal rate of 45 ml. At goal rate this will provide 1296 kcal (66% re-estimated energy needs), 60 gram protein (47% re-estimated protein needs), and 872 ml free water. 0 ml gastric residuals to report  EEG findings indicate possible herpes encephalitis. Patient remains intubated.Plan to extubate once mental status improved  Re-estimated needs: 1945 - 2045 kcal (Goal: 1165 - 1365 kcal), 127-137 gram protein Temp: 37.3 C Ve: 12 mL  Intervention:   Recommend to decrease goal rate of Jevity 1.2 to 30 ml/hr via OGT. Supplement with 30 ml ProStat six times daily. This will provide 1296 kcal (66% of estimated energy needs), and 130 gram protein (100% estimated protein needs)  Monitor: TF tolerance, weights, I/O, labs  Atlee Abide Pager # [place], [place]

## 2011-05-26 NOTE — Progress Notes (Signed)
Subjective: Patient remains intubated and unresponsive.  LP performed yesterday under fluoroscopy.  White blood cell count elevated with a lymphocyte predominance.  Protein elevated as well.  EEG shows PLEDS activity on the left.  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  Patient remains on Acyclovir. No seizures noted overnight.  Objective: Current vital signs: BP 131/45  Pulse 58  Temp(Src) 99.2 F (37.3 C) (Axillary)  Resp 15  Ht 5\' 8"  (1.727 m)  Wt 96.4 kg (212 lb 8.4 oz)  BMI 32.31 kg/m2  SpO2 100% Vital signs in last 24 hours: Temp:  [98.4 F (36.9 C)-99.8 F (37.7 C)] 99.2 F (37.3 C) (03/26 0400) Pulse Rate:  [38-64] 58  (03/26 0900) Resp:  [13-18] 15  (03/26 0900) BP: (106-194)/(32-70) 131/45 mmHg (03/26 0900) SpO2:  [97 %-100 %] 100 % (03/26 0900) Arterial Line BP: (90-172)/(55-72) 136/58 mmHg (03/26 0700) FiO2 (%):  [29.8 %-30.3 %] 30.3 % (03/26 0900) Weight:  [96.4 kg (212 lb 8.4 oz)] 96.4 kg (212 lb 8.4 oz) (03/26 0400)  Intake/Output from previous day: 03/25 0701 - 03/26 0700 In: 2198.6 [I.V.:1448.2; NG/GT:90; IV Piggyback:420.4] Out: 1100 [Urine:1100] Intake/Output this shift: Total I/O In: 72.1 [I.V.:32.1; Other:40] Out: 16 [Urine:70; Emesis/NG output:3] Nutritional status:    Neurologic Exam: Mental Status:  Patient does not respond to verbal stimuli. Grimaces to sternal rub. Does not follow commands. No verbalizations noted.  Cranial Nerves:  II: patient does not respond confrontation bilaterally, forcibly closes eyes when attempted to be opened therefore pupillary reaction unable to be tested. III,IV,VI: Unable to test V,VII: strong eye closure bilaterally VIII: patient does not respond to verbal stimuli  IX,X: gag reflex not tested, XI: trapezius strength unable to test bilaterally  XII: tongue strength unable to test  Motor:  No movement noted in the upper extremities today.  Movement of both lower extremities noted to mild  stimulation Deep Tendon Reflexes:  Absent throughout.  Plantars:  upgoing bilaterally  Cerebellar:  Unable to perform   Lab Results: Results for orders placed during the hospital encounter of 05/22/11 (from the past 48 hour(s))  PROTIME-INR     Status: Abnormal   Collection Time   05/24/11 11:20 AM      Component Value Range Comment   Prothrombin Time 16.7 (*) 11.6 - 15.2 (seconds)    INR 1.33  0.00 - 1.49    GLUCOSE, CAPILLARY     Status: Abnormal   Collection Time   05/24/11 11:46 AM      Component Value Range Comment   Glucose-Capillary 230 (*) 70 - 99 (mg/dL)   GLUCOSE, CAPILLARY     Status: Abnormal   Collection Time   05/24/11  3:14 PM      Component Value Range Comment   Glucose-Capillary 183 (*) 70 - 99 (mg/dL)   GLUCOSE, CAPILLARY     Status: Abnormal   Collection Time   05/24/11  7:36 PM      Component Value Range Comment   Glucose-Capillary 123 (*) 70 - 99 (mg/dL)   PHENYTOIN LEVEL, TOTAL     Status: Abnormal   Collection Time   05/24/11 11:00 PM      Component Value Range Comment   Phenytoin Lvl 6.8 (*) 10.0 - 20.0 (ug/mL)   GLUCOSE, CAPILLARY     Status: Abnormal   Collection Time   05/24/11 11:10 PM      Component Value Range Comment   Glucose-Capillary 134 (*) 70 - 99 (mg/dL)   GLUCOSE, CAPILLARY  Status: Abnormal   Collection Time   05/25/11  4:34 AM      Component Value Range Comment   Glucose-Capillary 211 (*) 70 - 99 (mg/dL)   PROTIME-INR     Status: Abnormal   Collection Time   05/25/11  4:36 AM      Component Value Range Comment   Prothrombin Time 17.2 (*) 11.6 - 15.2 (seconds)    INR 1.38  0.00 - [id number]    BASIC METABOLIC PANEL     Status: Abnormal   Collection Time   05/25/11  4:36 AM      Component Value Range Comment   Sodium 135  135 - 145 (mEq/L)    Potassium 2.4 (*) 3.5 - 5.1 (mEq/L)    Chloride 94 (*) 96 - 112 (mEq/L)    CO2 28  19 - 32 (mEq/L)    Glucose, Bld 222 (*) 70 - 99 (mg/dL)    BUN 18  6 - 23 (mg/dL)    Creatinine, Ser 0.99   0.50 - 1.10 (mg/dL)    Calcium 8.2 (*) 8.4 - 10.5 (mg/dL)    GFR calc non Af Amer 57 (*) >90 (mL/min)    GFR calc Af Amer 66 (*) >90 (mL/min)   CBC     Status: Abnormal   Collection Time   05/25/11  4:36 AM      Component Value Range Comment   WBC 10.6 (*) 4.0 - 10.5 (K/uL)    RBC 4.09  3.87 - 5.11 (MIL/uL)    Hemoglobin 11.8 (*) 12.0 - 15.0 (g/dL)    HCT 35.9 (*) 36.0 - 46.0 (%)    MCV 87.8  78.0 - 100.0 (fL)    MCH 28.9  26.0 - 34.0 (pg)    MCHC 32.9  30.0 - 36.0 (g/dL)    RDW 14.6  11.5 - 15.5 (%)    Platelets 260  150 - 400 (K/uL)   BLOOD GAS, ARTERIAL     Status: Abnormal   Collection Time   05/25/11  5:20 AM      Component Value Range Comment   FIO2 0.40      Delivery systems VENTILATOR      VT 480      Rate 18      Peep/cpap 5.0      pH, Arterial 7.555 (*) 7.350 - 7.400     pCO2 arterial 33.7 (*) 35.0 - 45.0 (mmHg)    pO2, Arterial 63.7 (*) 80.0 - 100.0 (mmHg)    Bicarbonate 29.2 (*) 20.0 - 24.0 (mEq/L)    TCO2 30.1  0 - 100 (mmol/L)    Acid-Base Excess 6.8 (*) 0.0 - 2.0 (mmol/L)    O2 Saturation 92.8      Patient temperature 102.8      Collection site A-LINE      Drawn by [phone]      Sample type ARTERIAL DRAW     PHENYTOIN LEVEL, TOTAL     Status: Abnormal   Collection Time   05/25/11  8:00 AM      Component Value Range Comment   Phenytoin Lvl 8.0 (*) 10.0 - 20.0 (ug/mL)   GLUCOSE, CAPILLARY     Status: Abnormal   Collection Time   05/25/11  8:04 AM      Component Value Range Comment   Glucose-Capillary 253 (*) 70 - 99 (mg/dL)   GLUCOSE, CAPILLARY     Status: Abnormal   Collection Time   05/25/11  9:32 AM  Component Value Range Comment   Glucose-Capillary 280 (*) 70 - 99 (mg/dL)   GLUCOSE, CAPILLARY     Status: Abnormal   Collection Time   05/25/11 10:33 AM      Component Value Range Comment   Glucose-Capillary 226 (*) 70 - 99 (mg/dL)   PROTEIN AND GLUCOSE, CSF     Status: Abnormal   Collection Time   05/25/11 11:20 AM      Component Value Range Comment    Glucose, CSF 106 (*) 43 - 76 (mg/dL)    Total  Protein, CSF 200 (*) 15 - 45 (mg/dL)   CSF CELL COUNT WITH DIFFERENTIAL     Status: Abnormal   Collection Time   05/25/11 11:20 AM      Component Value Range Comment   Tube # 2      Color, CSF PALE (*) COLORLESS  YELLOW   Appearance, CSF SLIGHT (*) CLEAR  HAZY   Supernatant SLIGHT   XANTHOCHROMIC   RBC Count, CSF 38 (*) 0 (/cu mm)    WBC, CSF 980 (*) 0 - 5 (/cu mm)    Segmented Neutrophils-CSF 2  0 - 6 (%)    Lymphs, CSF 94 (*) 40 - 80 (%)    Monocyte-Macrophage-Spinal Fluid 4 (*) 15 - 45 (%)    Eosinophils, CSF 0  0 - 1 (%)    Other Cells, CSF PLASMACYTOID LYMPHS PRESENT     PATHOLOGIST SMEAR REVIEW     Status: Normal   Collection Time   05/25/11 11:20 AM      Component Value Range Comment   Tech Review Increased normal and reactive lymphocytes     GLUCOSE, CAPILLARY     Status: Abnormal   Collection Time   05/25/11 11:38 AM      Component Value Range Comment   Glucose-Capillary 196 (*) 70 - 99 (mg/dL)   GLUCOSE, CAPILLARY     Status: Abnormal   Collection Time   05/25/11 12:42 PM      Component Value Range Comment   Glucose-Capillary 207 (*) 70 - 99 (mg/dL)   GLUCOSE, CAPILLARY     Status: Abnormal   Collection Time   05/25/11  1:54 PM      Component Value Range Comment   Glucose-Capillary 174 (*) 70 - 99 (mg/dL)   GLUCOSE, CAPILLARY     Status: Abnormal   Collection Time   05/25/11  2:56 PM      Component Value Range Comment   Glucose-Capillary 149 (*) 70 - 99 (mg/dL)   GLUCOSE, CAPILLARY     Status: Abnormal   Collection Time   05/25/11  3:40 PM      Component Value Range Comment   Glucose-Capillary 155 (*) 70 - 99 (mg/dL)    Comment 1 Documented in Chart      Comment 2 Notify RN     ALBUMIN     Status: Abnormal   Collection Time   05/25/11  4:51 PM      Component Value Range Comment   Albumin 2.3 (*) 3.5 - 5.2 (g/dL)   GLUCOSE, CAPILLARY     Status: Abnormal   Collection Time   05/25/11  7:29 PM      Component  Value Range Comment   Glucose-Capillary 146 (*) 70 - 99 (mg/dL)    Comment 1 Notify RN      Comment 2 Documented in Chart     GLUCOSE, CAPILLARY     Status: Abnormal   Collection  Time   05/26/11 12:32 AM      Component Value Range Comment   Glucose-Capillary 142 (*) 70 - 99 (mg/dL)    Comment 1 Notify RN      Comment 2 Documented in Chart     GLUCOSE, CAPILLARY     Status: Abnormal   Collection Time   05/26/11  3:49 AM      Component Value Range Comment   Glucose-Capillary 109 (*) 70 - 99 (mg/dL)    Comment 1 Notify RN      Comment 2 Documented in Chart     PHENYTOIN LEVEL, TOTAL     Status: Abnormal   Collection Time   05/26/11  4:51 AM      Component Value Range Comment   Phenytoin Lvl 5.9 (*) 10.0 - 20.0 (ug/mL)   BASIC METABOLIC PANEL     Status: Abnormal   Collection Time   05/26/11  4:51 AM      Component Value Range Comment   Sodium 139  135 - 145 (mEq/L)    Potassium 3.4 (*) 3.5 - 5.1 (mEq/L) DELTA CHECK NOTED   Chloride 103  96 - 112 (mEq/L)    CO2 29  19 - 32 (mEq/L)    Glucose, Bld 121 (*) 70 - 99 (mg/dL)    BUN 17  6 - 23 (mg/dL)    Creatinine, Ser 0.95  0.50 - 1.10 (mg/dL)    Calcium 8.5  8.4 - 10.5 (mg/dL)    GFR calc non Af Amer 60 (*) >90 (mL/min)    GFR calc Af Amer 69 (*) >90 (mL/min)   CBC     Status: Abnormal   Collection Time   05/26/11  4:51 AM      Component Value Range Comment   WBC 10.3  4.0 - 10.5 (K/uL)    RBC 4.03  3.87 - 5.11 (MIL/uL)    Hemoglobin 11.8 (*) 12.0 - 15.0 (g/dL)    HCT 35.3 (*) 36.0 - 46.0 (%)    MCV 87.6  78.0 - 100.0 (fL)    MCH 29.3  26.0 - 34.0 (pg)    MCHC 33.4  30.0 - 36.0 (g/dL)    RDW 14.8  11.5 - 15.5 (%)    Platelets 233  150 - 400 (K/uL)   PROTIME-INR     Status: Abnormal   Collection Time   05/26/11  4:51 AM      Component Value Range Comment   Prothrombin Time 19.6 (*) 11.6 - 15.2 (seconds)    INR 1.63 (*) 0.00 - 1.49    PROCALCITONIN     Status: Normal   Collection Time   05/26/11  4:51 AM      Component  Value Range Comment   Procalcitonin 0.10     GLUCOSE, CAPILLARY     Status: Abnormal   Collection Time   05/26/11  8:36 AM      Component Value Range Comment   Glucose-Capillary 141 (*) 70 - 99 (mg/dL)     Recent Results (from the past 240 hour(s))  CULTURE, BLOOD (ROUTINE X 2)     Status: Normal   Collection Time   05/17/11  7:33 PM      Component Value Range Status Comment   Specimen Description BLOOD LEFT ARM   Final    Special Requests     Final    Value: BOTTLES DRAWN AEROBIC AND ANAEROBIC 10CC AEROBIC [place] ANAEROBIC   Culture  Setup Time [id number]   Final  Culture NO GROWTH 5 DAYS   Final    Report Status 05/24/2011 FINAL   Final   CULTURE, BLOOD (ROUTINE X 2)     Status: Normal   Collection Time   05/17/11  7:38 PM      Component Value Range Status Comment   Specimen Description BLOOD LEFT ARM   Final    Special Requests     Final    Value: BOTTLES DRAWN AEROBIC AND ANAEROBIC 10CC AEROBIC 8CC ANAEROBIC   Culture  Setup Time [id number]   Final    Culture NO GROWTH 5 DAYS   Final    Report Status 05/24/2011 FINAL   Final   URINE CULTURE     Status: Normal   Collection Time   05/18/11  3:02 AM      Component Value Range Status Comment   Specimen Description URINE, RANDOM   Final    Special Requests NONE   Final    Culture  Setup Time [id number]   Final    Colony Count >=100,000 COLONIES/ML   Final    Culture ESCHERICHIA COLI   Final    Report Status 05/20/2011 FINAL   Final    Organism ID, Bacteria ESCHERICHIA COLI   Final   URINE CULTURE     Status: Normal   Collection Time   05/22/11 10:56 AM      Component Value Range Status Comment   Specimen Description URINE, CATHETERIZED   Final    Special Requests NONE   Final    Culture  Setup Time [id number]   Final    Colony Count 45,000 COLONIES/ML   Final    Culture ESCHERICHIA COLI   Final    Report Status 05/24/2011 FINAL   Final    Organism ID, Bacteria ESCHERICHIA COLI   Final   MRSA PCR SCREENING      Status: Normal   Collection Time   05/24/11 12:20 AM      Component Value Range Status Comment   MRSA by PCR NEGATIVE  NEGATIVE  Final     Lipid Panel No results found for this basename: CHOL,TRIG,HDL,CHOLHDL,VLDL,LDLCALC in the last 72 hours  Studies/Results: Dg Chest Port 1 View  05/26/2011  *RADIOLOGY REPORT*  Clinical Data: Follow up respiratory failure  PORTABLE CHEST - 1 VIEW  Comparison: 05/25/11  Findings:  The endotracheal tube tip is stable above the carina.  There is a nasogastric tube looped in the stomach.  There is asymmetric elevation of the right hemidiaphragm.  Atelectasis is noted within both lung bases, right greater than left.  IMPRESSION:  1.  Continued bibasilar atelectasis with asymmetric elevation of the right hemidiaphragm.  Original Report Authenticated By: Angelita Ingles, M.D.   Dg Chest Port 1 View  05/25/2011  *RADIOLOGY REPORT*  Clinical Data: Endotracheal tube evaluation.  PORTABLE CHEST - 1 VIEW  Comparison: 05/24/2011.  Findings: Endotracheal tube is in satisfactory position. Nasogastric tube is followed into the stomach.  Heart size stable. Mild bibasilar air space disease.  Lungs are low in volume.  No definite pleural fluid.  Old bilateral rib fractures.  IMPRESSION: Low lung volumes with mild bibasilar air space disease.  Original Report Authenticated By: Luretha Rued, M.D.   Dg Fluoro Guide Ndl Plc/bx  05/25/2011  *RADIOLOGY REPORT*  Clinical Data:  Encephalitis  DIAGNOSTIC LUMBAR PUNCTURE UNDER FLUOROSCOPIC GUIDANCE  Fluoroscopy time:  1.3 minutes.  Technique:  Informed consent was obtained from the patient prior to the procedure, including potential complications  of headache, allergy, and pain.   With the patient prone, the lower back was prepped with Betadine.  1% Lidocaine was used for local anesthesia. Lumbar puncture was performed at the rate L3-L4 level using a 20 gauge needle with return of clear yellow tinged  CSF with an opening pressure of 15  cm water.   12 ml of CSF were obtained for laboratory studies.  The patient tolerated the procedure well and there were no apparent complications. The patient is intubated during the procedure.  IMPRESSION: Successful lumbar puncture.  Clear yellow tinged CSF.  Original Report Authenticated By: Suzy Bouchard, M.D.    Medications:  I have reviewed the patient's current medications. Scheduled:   . acyclovir  10 mg/kg (Ideal) Intravenous Q8H  . antiseptic oral rinse  15 mL Mouth Rinse QID  . aspirin  81 mg Per Tube Daily  . chlorhexidine  15 mL Mouth Rinse BID  . insulin aspart  0-20 Units Subcutaneous Q4H  . insulin glargine  30 Units Subcutaneous BID  . pantoprazole sodium  40 mg Per Tube Q1200  . phenytoin (DILANTIN) IV  100 mg Intravenous Q8H  . potassium chloride  40 mEq Per Tube TID  . DISCONTD: acyclovir  10 mg/kg (Ideal) Intravenous Q8H  . DISCONTD: aspirin EC  81 mg Oral Daily  . DISCONTD: atorvastatin  10 mg Oral q1800  . DISCONTD: calcium-vitamin D  1 tablet Oral Q breakfast  . DISCONTD: cefTRIAXone (ROCEPHIN)  IV  1 g Intravenous Q24H  . DISCONTD: diltiazem  120 mg Oral Daily  . DISCONTD: docusate  100 mg Per Tube BID  . DISCONTD: docusate sodium  100 mg Oral BID  . DISCONTD: LORazepam  1 mg Intravenous Once  . DISCONTD: metoprolol  2.5 mg Intravenous Q12H  . DISCONTD: mulitivitamin with minerals  1 tablet Oral Daily  . DISCONTD: pantoprazole (PROTONIX) IV  40 mg Intravenous Daily  . 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 LP and EEG results consistent with this presumption.  PCR pending.  Cultures pending as well.  With PCR analysis being so crucial bacterial cultures had to be performed on residual fluid that was accessed with a non-sterile pipette.  Results may not be reliable and will  have to interpret with this in mind.      Plan: 1. Continue Acyclovir.  PCR pending Seizure (05/24/2011)   Assessment: Patient without seizure activity overnight. Dilantin level low but corrects to therapeutic with low albumin.   Plan: 1. Would have pharmacy follow Dilantin.   LOS: 4 days   Alexis Goodell, MD [facility] [phone] 05/26/2011  10:06 AM

## 2011-05-26 NOTE — Progress Notes (Signed)
UR Completed.  Vergie Living [id number] 05/26/2011

## 2011-05-27 DIAGNOSIS — B004 Herpesviral encephalitis: Secondary | ICD-10-CM

## 2011-05-27 DIAGNOSIS — Z9911 Dependence on respirator [ventilator] status: Secondary | ICD-10-CM

## 2011-05-27 DIAGNOSIS — J9600 Acute respiratory failure, unspecified whether with hypoxia or hypercapnia: Secondary | ICD-10-CM

## 2011-05-27 DIAGNOSIS — G40309 Generalized idiopathic epilepsy and epileptic syndromes, not intractable, without status epilepticus: Secondary | ICD-10-CM

## 2011-05-27 LAB — BASIC METABOLIC PANEL
BUN: 16 mg/dL (ref 6–23)
Chloride: 106 mEq/L (ref 96–112)
GFR calc Af Amer: 61 mL/min — ABNORMAL LOW (ref >90)
GFR calc non Af Amer: 53 mL/min — ABNORMAL LOW (ref >90)
Potassium: 3.6 mEq/L (ref 3.5–5.1)
Sodium: 141 mEq/L (ref 135–145)

## 2011-05-27 LAB — GLUCOSE, CAPILLARY
Glucose-Capillary: 160 mg/dL — ABNORMAL HIGH (ref 70–99)
Glucose-Capillary: 172 mg/dL — ABNORMAL HIGH (ref 70–99)
Glucose-Capillary: 115 mg/dL — ABNORMAL HIGH (ref 70–99)
Glucose-Capillary: 93 mg/dL (ref 70–99)

## 2011-05-27 MED ORDER — ALBUTEROL SULFATE (5 MG/ML) 0.5% IN NEBU
2.5000 mg | INHALATION_SOLUTION | RESPIRATORY_TRACT | Status: DC | PRN
  Administered 2011-05-27: 2.5 mg via RESPIRATORY_TRACT

## 2011-05-27 MED ORDER — ALBUTEROL SULFATE (5 MG/ML) 0.5% IN NEBU
INHALATION_SOLUTION | RESPIRATORY_TRACT | Status: AC
  Filled 2011-05-27: qty 0.5

## 2011-05-27 MED ORDER — KCL IN DEXTROSE-NACL 20-5-0.45 MEQ/L-%-% IV SOLN
INTRAVENOUS | Status: DC
  Administered 2011-05-27: 14:00:00 via INTRAVENOUS
  Filled 2011-05-27 (×4): qty 1000

## 2011-05-27 MED ORDER — INSULIN GLARGINE 100 UNIT/ML ~~LOC~~ SOLN
20.0000 [IU] | Freq: Two times a day (BID) | SUBCUTANEOUS | Status: DC
Start: 2011-05-27 — End: 2011-05-28
  Administered 2011-05-27 – 2011-05-28 (×2): 20 [IU] via SUBCUTANEOUS

## 2011-05-27 MED ORDER — FENTANYL CITRATE 0.05 MG/ML IJ SOLN: 12.5000 ug | INTRAMUSCULAR | Status: DC | PRN

## 2011-05-27 NOTE — Progress Notes (Addendum)
[facility] PROGRESS NOTE    SUBJECTIVE   Patient was just extubated.  She is able to follow minimal commands but still unable to speak.  No seizures over night.   OBJECTIVE   Vital signs in last 24 hours: Temp:  [98.3 F (36.8 C)-100.8 F (38.2 C)] 100.8 F (38.2 C) (03/27 0000) Pulse Rate:  [57-88] 78  (03/27 0900) Resp:  [12-22] 21  (03/27 0900) BP: (110-169)/(38-122) 153/49 mmHg (03/27 0900) SpO2:  [92 %-100 %] 100 % (03/27 0900) FiO2 (%):  [29.9 %-30.2 %] 29.9 % (03/27 0900) Weight:  [92.1 kg (203 lb 0.7 oz)] 92.1 kg (203 lb 0.7 oz) (03/27 0000)  Intake/Output from previous day: 03/26 0701 - 03/27 0700 In: 1849.8 [I.V.:361.2; IV Piggyback:638.6] Out: [id number] [Urine:1240; Emesis/NG output:3] Intake/Output this shift: Total I/O In: 170 [I.V.:20; Other:90; NG/GT:60] Out: 300 [Urine:300] Nutritional status: NPO  Past Medical History  Diagnosis Date  . Blindness of left eye   . Eye muscle weakness     Right eye weakness after cataract surgery  . Common migraine     ?hx of  . DIABETES MELLITUS, TYPE II 03/27/2006  . HYPERLIPIDEMIA 03/27/2006  . 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 HYPERTENSION 03/27/2006  . Atrial fibrillation     SVT dx 2007, cath 2007 mild CAD, then had an cardioversion, ablation; still on coumadin , has occ palpitation, EKG 03-2010 NSR  . Headache 05/07/2009  . LUNG NODULE 09/01/2006    Excision, Bx [facility]  . Osteopenia 2004    Dexa 2004 showed Osteopenia, DEXA 03/2007 normal    Neurologic Exam:  Mental Status:  Patient has just been extubated.  Will follow simple one step commands. Grimaces to pain in all extremities.  Cranial Nerves:  II: patient blinks to threat bilaterally. Pupils are 2-1 mm and reactive III,IV,VI: is able to follow my finger looking left to right horizontally, shows minimal vertical gaze.  Nystagmus noted when looking to far right with fast phase to the left.  V,VII: strong  eye closure bilaterally  VIII: patient will look toward voice and follow simple commands IX,X: gag reflex not tested, XI: trapezius strength unable to test bilaterally  XII: tongue strength unable to test  Motor:  Holds bilateral arms antigravity without difficulty. Grip weak bilaterally.  Will withdrawal left leg off bed 1 inch when noxious stimuli given.  Bends right knee when noxious stimuli given to right leg Deep Tendon Reflexes:  Depressed 1+ throughout upper extremities and KJ.  No AJ Plantars:  upgoing right and mute on left Cerebellar:  Unable to perform   Lab Results: Lab Results  Component Value Date/Time   CHOL 156 09/29/2010 12:07 PM   Lipid Panel No results found for this basename: CHOL,TRIG,HDL,CHOLHDL,VLDL,LDLCALC in the last 72 hours  Studies/Results: Dg Chest Port 1 View  05/26/2011  *RADIOLOGY REPORT*  Clinical Data: Follow up respiratory failure  PORTABLE CHEST - 1 VIEW  Comparison: 05/25/11  Findings:  The endotracheal tube tip is stable above the carina.  There is a nasogastric tube looped in the stomach.  There is asymmetric elevation of the right hemidiaphragm.  Atelectasis is noted within both lung bases, right greater than left.  IMPRESSION:  1.  Continued bibasilar atelectasis with asymmetric elevation of the right hemidiaphragm.  Original Report Authenticated By: Angelita Ingles, M.D.   Dg Fluoro Guide Ndl Plc/bx  05/25/2011  *RADIOLOGY REPORT*  Clinical Data:  Encephalitis  DIAGNOSTIC LUMBAR PUNCTURE UNDER FLUOROSCOPIC GUIDANCE  Fluoroscopy time:  1.3 minutes.  Technique:  Informed consent was obtained from the patient prior to the procedure, including potential complications of headache, allergy, and pain.   With the patient prone, the lower back was prepped with Betadine.  1% Lidocaine was used for local anesthesia. Lumbar puncture was performed at the rate L3-L4 level using a 20 gauge needle with return of clear yellow tinged  CSF with an opening pressure of  15 cm water.   12 ml of CSF were obtained for laboratory studies.  The patient tolerated the procedure well and there were no apparent complications. The patient is intubated during the procedure.  IMPRESSION: Successful lumbar puncture.  Clear yellow tinged CSF.  Original Report Authenticated By: Suzy Bouchard, M.D.    Medications:     Scheduled:   . acyclovir  10 mg/kg (Ideal) Intravenous Q8H  . albuterol      . antiseptic oral rinse  15 mL Mouth Rinse QID  . aspirin  81 mg Per Tube Daily  . chlorhexidine  15 mL Mouth Rinse BID  . insulin aspart  0-20 Units Subcutaneous Q4H  . insulin glargine  20 Units Subcutaneous BID  . phenytoin (DILANTIN) IV  150 mg Intravenous Q8H  . phenytoin (DILANTIN) IV  500 mg Intravenous Once  . potassium chloride  40 mEq Per Tube Once  . DISCONTD: insulin glargine  30 Units Subcutaneous BID  . DISCONTD: pantoprazole sodium  40 mg Per Tube Q1200  . 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 LP and EEG results consistent with this presumption.  HSV-1 DNA detected by PCR, gram stain showed no organism, CSF culture negative  Plan: 1. Continue Acyclovir.  Seizure (05/24/2011) Assessment: Patient without seizure activity overnight. Dilantin level low but corrects to therapeutic with low albumin. Plan: 1. Pharmacy to follow Dilantin. 2. Call with questions  I have discussed patient with Dr. Paulino Rily and he agrees with the above mentioned.    Etta Quill PA-C [facility] [phone]  05/27/2011, 9:41 AM

## 2011-05-27 NOTE — Progress Notes (Signed)
INFECTIOUS DISEASE PROGRESS NOTE    Date of Admission:  05/22/2011   Total days of antibiotics 6        Day 5 acyclovir        Ceftriaxone 3/22-25         Principal Problem:  *Toxic metabolic encephalopathy Active Problems:  DIABETES MELLITUS, TYPE II  HYPERLIPIDEMIA  PERIPHERAL NEUROPATHY  HYPERTENSION  Atrial fibrillation  Hypokalemia  E. coli UTI (urinary tract infection)  Seizure  Acute respiratory failure with hypoxia      . acyclovir  10 mg/kg (Ideal) Intravenous Q8H  . albuterol      . antiseptic oral rinse  15 mL Mouth Rinse QID  . aspirin  81 mg Per Tube Daily  . chlorhexidine  15 mL Mouth Rinse BID  . insulin aspart  0-20 Units Subcutaneous Q4H  . insulin glargine  20 Units Subcutaneous BID  . phenytoin (DILANTIN) IV  150 mg Intravenous Q8H  . phenytoin (DILANTIN) IV  500 mg Intravenous Once  . potassium chloride  40 mEq Per Tube Once  . DISCONTD: insulin glargine  30 Units Subcutaneous BID  . DISCONTD: pantoprazole sodium  40 mg Per Tube Q1200    Subjective: Extubated this am  Objective: Temp:  [98.3 F (36.8 C)-100.8 F (38.2 C)] 100.8 F (38.2 C) (03/27 0000) Pulse Rate:  [57-88] 81  (03/27 1030) Resp:  [12-24] 20  (03/27 1030) BP: (110-170)/(38-122) 116/65 mmHg (03/27 1030) SpO2:  [92 %-100 %] 99 % (03/27 1030) FiO2 (%):  [2 %-30.2 %] 2 % (03/27 1030) Weight:  [203 lb 0.7 oz (92.1 kg)] 203 lb 0.7 oz (92.1 kg) (03/27 0000)  General: sleeping but arousable Skin: no rashes Lungs: CTA B Cor: RRR  Lab Results Lab Results  Component Value Date   WBC 10.3 05/26/2011   HGB 11.8* 05/26/2011   HCT 35.3* 05/26/2011   MCV 87.6 05/26/2011   PLT 233 05/26/2011    Lab Results  Component Value Date   CREATININE 1.05 05/27/2011   BUN 16 05/27/2011   NA 141 05/27/2011   K 3.6 05/27/2011   CL 106 05/27/2011   CO2 28 05/27/2011    Lab Results  Component Value Date   ALT 21 05/22/2011   AST 26 05/22/2011   ALKPHOS 74 05/22/2011   BILITOT 0.5 05/22/2011       Microbiology: Recent Results (from the past 240 hour(s))  CULTURE, BLOOD (ROUTINE X 2)     Status: Normal   Collection Time   05/17/11  7:33 PM      Component Value Range Status Comment   Specimen Description BLOOD LEFT ARM   Final    Special Requests     Final    Value: BOTTLES DRAWN AEROBIC AND ANAEROBIC 10CC AEROBIC 8CC ANAEROBIC   Culture  Setup Time [id number]   Final    Culture NO GROWTH 5 DAYS   Final    Report Status 05/24/2011 FINAL   Final   CULTURE, BLOOD (ROUTINE X 2)     Status: Normal   Collection Time   05/17/11  7:38 PM      Component Value Range Status Comment   Specimen Description BLOOD LEFT ARM   Final    Special Requests     Final    Value: BOTTLES DRAWN AEROBIC AND ANAEROBIC 10CC AEROBIC 8CC ANAEROBIC   Culture  Setup Time [id number]   Final    Culture NO GROWTH 5 DAYS   Final    Report  Status 05/24/2011 FINAL   Final   URINE CULTURE     Status: Normal   Collection Time   05/18/11  3:02 AM      Component Value Range Status Comment   Specimen Description URINE, RANDOM   Final    Special Requests NONE   Final    Culture  Setup Time [id number]   Final    Colony Count >=100,000 COLONIES/ML   Final    Culture ESCHERICHIA COLI   Final    Report Status 05/20/2011 FINAL   Final    Organism ID, Bacteria ESCHERICHIA COLI   Final   URINE CULTURE     Status: Normal   Collection Time   05/22/11 10:56 AM      Component Value Range Status Comment   Specimen Description URINE, CATHETERIZED   Final    Special Requests NONE   Final    Culture  Setup Time [id number]   Final    Colony Count 45,000 COLONIES/ML   Final    Culture ESCHERICHIA COLI   Final    Report Status 05/24/2011 FINAL   Final    Organism ID, Bacteria ESCHERICHIA COLI   Final   MRSA PCR SCREENING     Status: Normal   Collection Time   05/24/11 12:20 AM      Component Value Range Status Comment   MRSA by PCR NEGATIVE  NEGATIVE  Final   CSF CULTURE     Status: Normal (Preliminary result)    Collection Time   05/25/11 11:20 AM      Component Value Range Status Comment   Specimen Description CSF   Final    Special Requests     Final    Value: ADDED ON 05/26/2011 AT 1154 PER REYNOLDS,LESLIE MD ON 3100 AT 1012 TUBE 2 1.2CC   Gram Stain     Final    Value: CYTOSPIN WBC PRESENT, PREDOMINANTLY MONONUCLEAR     NO ORGANISMS SEEN     Performed at [facility]   Culture NO GROWTH   Final    Report Status PENDING   Incomplete   GRAM STAIN     Status: Normal   Collection Time   05/25/11 11:20 AM      Component Value Range Status Comment   Specimen Description CSF   Final    Special Requests     Final    Value: ADDED ON 05/26/2011 AT 1154 PER REYNOLDS, LESLIE MD ON 3100 AT 1012 TUBE 2 1.2CC   Gram Stain     Final    Value: CYTOSPIN SLIDE     WBC PRESENT, PREDOMINANTLY MONONUCLEAR     NO ORGANISMS SEEN   Report Status 05/26/2011 FINAL   Final     Studies/Results: Dg Chest Port 1 View  05/26/2011  *RADIOLOGY REPORT*  Clinical Data: Follow up respiratory failure  PORTABLE CHEST - 1 VIEW  Comparison: 05/25/11  Findings:  The endotracheal tube tip is stable above the carina.  There is a nasogastric tube looped in the stomach.  There is asymmetric elevation of the right hemidiaphragm.  Atelectasis is noted within both lung bases, right greater than left.  IMPRESSION:  1.  Continued bibasilar atelectasis with asymmetric elevation of the right hemidiaphragm.  Original Report Authenticated By: Angelita Ingles, M.D.     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  No current seizure activity.  Plan:  -continue with acyclovir.    Scharlene Gloss, MD

## 2011-05-27 NOTE — Progress Notes (Signed)
  Patient name: [name] [name] Medical record number: [id number] Date of birth: August 31, 1940 Age: 71 y.o. Gender: female PCP: [name], MD, MD  PCCM PROGRESS NOTE   Date: 05/27/2011 Reason for Consult: respiratory failure Referring Physician: Marye Round  HPI:  71 y/o female with new left temporal lobe lesion, seizures and delirium who was intubated for airway protection on 3/24 after seizures and receiving benzodiazepines.  She had recently been hospitalized at [facility] from 3/16 through 3/19 for similar issues.  Lines/tubes ETT 3/24>>> 3/27  Culture data/sepsis markers 3/18 UCx: e.coli pan sensitive 3/23 UCx E. Coli 3/25 HSV1 PCR POSITIVE  Antibiotics Acyclovir 3/24>>> Ceftriaxone 3/22>>>3/25  Best practice GI ppx: PPI DVT ppx: SCD (INR elevated)   Events/studies 3/16 noncontrast CT brain: New cortical hypodensity at left temporal lobe 3/16 MRI: Abnormal signal at left hippocampus and right temporal lobe. Consistent with HSV, glioma, paraneoplastic syndrome. The left hippocampus lesion is consistent also with seizure focus. 3/24 CT head: enlarging area of low attenuation medial left temporal lobe 3/25 LP:  980 wbc/cu mm, 94% lymphocytes, 2% segs 3/27 Much more responsive. + F/C. Extubated   SUBJ: Much more responsive. + F/C. Extubated this AM and looks good  Temp:  [98.3 F (36.8 C)-100.8 F (38.2 C)] 100.8 F (38.2 C) (03/27 0000) Pulse Rate:  [57-88] 81  (03/27 1030) Resp:  [12-24] 20  (03/27 1030) BP: (110-170)/(38-122) 116/65 mmHg (03/27 1030) SpO2:  [92 %-100 %] 99 % (03/27 1030) FiO2 (%):  [2 %-30.2 %] 2 % (03/27 1030) Weight:  [92.1 kg (203 lb 0.7 oz)] 92.1 kg (203 lb 0.7 oz) (03/27 0000)    Intake/Output Summary (Last 24 hours) at 05/27/11 1057 Last data filed at 05/27/11 1000  Gross per 24 hour  Intake 1917.65 ml  Output   1470 ml  Net 447.65 ml    Physical exam Gen: Spont eye opening. MAEs. + F/C HEENT: NCAT PULM: CTA B CV: RRR, no mgr AB: BS+, soft,  nontender, no hsm Ext: warm, trace edema   Vent Mode:  [-] CPAP FiO2 (%):  [2 %-30.2 %] 2 % Set Rate:  [14 bmp] 14 bmp Vt Set:  [480 mL] 480 mL PEEP:  [5 cmH20] 5 cmH20 Pressure Support:  [5 cmH20] 5 cmH20 Plateau Pressure:  [15 cmH20] 15 cmH20  LAB RESULT Lab Results  Component Value Date   CREATININE 1.05 05/27/2011   BUN 16 05/27/2011   NA 141 05/27/2011   K 3.6 05/27/2011   CL 106 05/27/2011   CO2 28 05/27/2011     Assessment and Plan  71 y/o female with new left temporal lobe lesion, seizures and delirium who was intubated for airway protection on 3/24 after seizures and receiving benzodiazepines.  Acute Respiratory failure due to neurological process:  - Extubated 3/27 and looks good  HSV encephalitis - PCR positive. Neuro and ID following -Cont Acyclovir  UTI: 3/18 pan sensitive E.coli -Cftrx D/C'd   PAF >> Converted 3/25 AM to NSR, sinus brady with prolonged QT -amiodarone gtt started 3/24 >. D/C'd 3/25 -PRN metoprolol for tachycardia  Htn - PRN hydralazine  DM2 - controlled -Cont SSI plus Lantus. TFs on hold. Decrease Lantus until diet resumed  Seizure: likely due to L temporal lobe lesions -Cont dilantin per Neurology   CC time today 30 minutes. Sister updated @ bedside    Merton Border, MD;  PCCM service; Mobile [phone]

## 2011-05-28 DIAGNOSIS — R404 Transient alteration of awareness: Secondary | ICD-10-CM

## 2011-05-28 LAB — CBC
Hemoglobin: 12 g/dL (ref 12.0–15.0)
MCHC: 32.3 g/dL (ref 30.0–36.0)
Platelets: 269 10*3/uL (ref 150–400)
RBC: 4.13 MIL/uL (ref 3.87–5.11)

## 2011-05-28 LAB — BASIC METABOLIC PANEL
BUN: 10 mg/dL (ref 6–23)
CO2: 30 mEq/L (ref 19–32)
Calcium: 8.6 mg/dL (ref 8.4–10.5)
Chloride: 101 mEq/L (ref 96–112)
GFR calc Af Amer: 84 mL/min — ABNORMAL LOW (ref >90)
GFR calc non Af Amer: 72 mL/min — ABNORMAL LOW (ref >90)
Glucose, Bld: 120 mg/dL — ABNORMAL HIGH (ref 70–99)

## 2011-05-28 LAB — GLUCOSE, CAPILLARY: Glucose-Capillary: 119 mg/dL — ABNORMAL HIGH (ref 70–99)

## 2011-05-28 MED ORDER — ENOXAPARIN SODIUM 40 MG/0.4ML ~~LOC~~ SOLN
40.0000 mg | SUBCUTANEOUS | Status: DC
  Administered 2011-05-28 – 2011-05-30 (×3): 40 mg via SUBCUTANEOUS
  Filled 2011-05-28 (×4): qty 0.4

## 2011-05-28 MED ORDER — INSULIN ASPART 100 UNIT/ML ~~LOC~~ SOLN
0.0000 [IU] | Freq: Every day | SUBCUTANEOUS | Status: DC
  Administered 2011-05-28: 2 [IU] via SUBCUTANEOUS

## 2011-05-28 MED ORDER — POTASSIUM CHLORIDE CRYS ER 20 MEQ PO TBCR
40.0000 meq | EXTENDED_RELEASE_TABLET | Freq: Three times a day (TID) | ORAL | Status: AC
  Administered 2011-05-28 (×3): 40 meq via ORAL
  Filled 2011-05-28 (×3): qty 2

## 2011-05-28 MED ORDER — DILTIAZEM HCL 30 MG PO TABS
30.0000 mg | ORAL_TABLET | Freq: Four times a day (QID) | ORAL | Status: DC
  Administered 2011-05-28 – 2011-05-31 (×10): 30 mg via ORAL
  Filled 2011-05-28 (×15): qty 1

## 2011-05-28 MED ORDER — DILTIAZEM HCL 25 MG/5ML IV SOLN
5.0000 mg | Freq: Once | INTRAVENOUS | Status: AC
  Administered 2011-05-28: 5 mg via INTRAVENOUS
  Filled 2011-05-28: qty 5

## 2011-05-28 MED ORDER — INSULIN ASPART 100 UNIT/ML ~~LOC~~ SOLN
0.0000 [IU] | Freq: Three times a day (TID) | SUBCUTANEOUS | Status: DC
  Administered 2011-05-28 – 2011-05-29 (×2): 3 [IU] via SUBCUTANEOUS
  Administered 2011-05-29 (×2): 4 [IU] via SUBCUTANEOUS
  Administered 2011-05-30 (×2): 3 [IU] via SUBCUTANEOUS

## 2011-05-28 MED ORDER — INSULIN GLARGINE 100 UNIT/ML ~~LOC~~ SOLN
30.0000 [IU] | Freq: Two times a day (BID) | SUBCUTANEOUS | Status: DC
  Administered 2011-05-28 – 2011-06-01 (×8): 30 [IU] via SUBCUTANEOUS
  Administered 2011-06-01: 23:00:00 via SUBCUTANEOUS
  Administered 2011-06-02: 30 [IU] via SUBCUTANEOUS

## 2011-05-28 MED ORDER — INSULIN ASPART 100 UNIT/ML ~~LOC~~ SOLN
4.0000 [IU] | Freq: Three times a day (TID) | SUBCUTANEOUS | Status: DC
  Administered 2011-05-28 – 2011-06-01 (×6): 4 [IU] via SUBCUTANEOUS

## 2011-05-28 MED ORDER — PHENYTOIN 50 MG PO CHEW
150.0000 mg | CHEWABLE_TABLET | Freq: Three times a day (TID) | ORAL | Status: DC
  Administered 2011-05-28 – 2011-06-02 (×16): 150 mg via ORAL
  Filled 2011-05-28 (×18): qty 3

## 2011-05-28 NOTE — Progress Notes (Signed)
Nutrition Follow-up  Diet Order:  CHO Modified, Low 1200-1500 kcal  Patient was extubated on 3/27. Diet was advanced on 3/28. ARF has resolved. No BMs or additional wounds documented. Is being repleted with potassium for hypokalemia.    Meds: Scheduled Meds:   . acyclovir  10 mg/kg (Ideal) Intravenous Q8H  . albuterol      . aspirin  81 mg Per Tube Daily  . enoxaparin (LOVENOX) injection  40 mg Subcutaneous Q24H  . insulin aspart  0-20 Units Subcutaneous TID WC  . insulin aspart  0-5 Units Subcutaneous QHS  . insulin aspart  4 Units Subcutaneous TID WC  . insulin glargine  30 Units Subcutaneous BID  . phenytoin  150 mg Oral TID  . potassium chloride  40 mEq Oral TID  . DISCONTD: antiseptic oral rinse  15 mL Mouth Rinse QID  . DISCONTD: chlorhexidine  15 mL Mouth Rinse BID  . DISCONTD: insulin aspart  0-20 Units Subcutaneous Q4H  . DISCONTD: insulin glargine  20 Units Subcutaneous BID  . DISCONTD: phenytoin (DILANTIN) IV  150 mg Intravenous Q8H   Continuous Infusions:   . DISCONTD: dextrose 5 % and 0.45 % NaCl with KCl 20 mEq/L 50 mL/hr at 05/28/11 0700   PRN Meds:.acetaminophen, fentaNYL, DISCONTD: acetaminophen, DISCONTD: albuterol, DISCONTD: hydrALAZINE, DISCONTD: labetalol, DISCONTD: metoprolol, DISCONTD: ondansetron  Labs:  CMP     Component Value Date/Time   NA 141 05/28/2011 0410   K 3.3* 05/28/2011 0410   CL 101 05/28/2011 0410   CO2 30 05/28/2011 0410   GLUCOSE 120* 05/28/2011 0410   BUN 10 05/28/2011 0410   CREATININE 0.81 05/28/2011 0410   CALCIUM 8.6 05/28/2011 0410   PROT 7.1 05/22/2011 1015   ALBUMIN 2.3* 05/25/2011 1651   AST 26 05/22/2011 1015   ALT 21 05/22/2011 1015   ALKPHOS 74 05/22/2011 1015   BILITOT 0.5 05/22/2011 1015   GFRNONAA 72* 05/28/2011 0410   GFRAA 84* 05/28/2011 0410     CBG (last 3)   Basename 05/28/11 1210 05/28/11 0824 05/28/11 0412  GLUCAP 125* 145* 119*     Intake/Output Summary (Last 24 hours) at 05/28/11 1222 Last data filed at  05/28/11 1100  Gross per 24 hour  Intake   1453 ml  Output   2455 ml  Net  -1002 ml   Patient ate 100% of meals since diet advancement. Appetite is good, no issues with swallowing or chewing any food items. No other GI issues to report. Patient and family did not feel any type of supplementation would be necessary. RN reported patient tolerating diet very well, and was eager to begin PO diet.  Weight Status:  207 lbs (94.3 kg). Down 5 lbs from admission on 3/22  Re-estimated needs:  1850- 2050 kcal, 90-105 gm protein  Nutrition Dx:  Inadequate oral intake r/t inability to eat as evidenced by NPO status. Status: Resolved  Goal: Consume >/=90% of estimated needs, unmet  - Patient currently on  CHO Modified, 1200-1500 kcal. This provides 65-81% of estimated needs  Intervention:  Consider upgrade to CHO Modified Medium 1600-2000 kcal to better meet patient's needs  No other nutritional interventions at this time  Please contact RD if additional nutritional assistance is requested   [name] [name] Pager #:  [name] [name], [name] [name] [phone]

## 2011-05-28 NOTE — Progress Notes (Signed)
Pt in a-fib w/ incr HR 130's-140's.  Afebrile, BP 130/88, mentation unchanged.  Dr. Nelda Marseille notified; orders received to begin diltiazem & change transfer to telemetry

## 2011-05-28 NOTE — Progress Notes (Signed)
Patient name: [name] [name] Medical record number: [id number] Date of birth: April 08, 1940 Age: 71 y.o. Gender: female PCP: [name], MD, MD  PCCM PROGRESS NOTE   Date: 05/28/2011 Reason for Consult: respiratory failure Referring Physician: Marye Round  HPI:  71 y/o female with new left temporal lobe lesion, seizures and delirium who was intubated for airway protection on 3/24 after seizures and receiving benzodiazepines.  She had recently been hospitalized at [facility] from 3/16 through 3/19 for similar issues.  Lines/tubes ETT 3/24>>> 3/27  Culture data/sepsis markers 3/18 UCx: e.coli pan sensitive 3/23 UCx E. Coli 3/25 HSV1 PCR POSITIVE  Antibiotics Acyclovir 3/24>>> Ceftriaxone 3/22>>>3/25  Best practice GI ppx: PPI DVT ppx: SCD (INR elevated)   Events/studies 3/16 noncontrast CT brain: New cortical hypodensity at left temporal lobe 3/16 MRI: Abnormal signal at left hippocampus and right temporal lobe. Consistent with HSV, glioma, paraneoplastic syndrome. The left hippocampus lesion is consistent also with seizure focus. 3/24 CT head: enlarging area of low attenuation medial left temporal lobe 3/25 LP:  980 wbc/cu mm, 94% lymphocytes, 2% segs 3/27 Much more responsive. + F/C. Extubated   SUBJ: Looks great. Cognition intact  Temp:  [97.3 F (36.3 C)-98.8 F (37.1 C)] 98 F (36.7 C) (03/28 0700) Pulse Rate:  [68-110] 77  (03/28 1000) Resp:  [16-29] 17  (03/28 1000) BP: (100-175)/(47-74) 153/62 mmHg (03/28 0900) SpO2:  [92 %-100 %] 96 % (03/28 1000) FiO2 (%):  [2 %] 2 % (03/27 1630) Weight:  [94.3 kg (207 lb 14.3 oz)] 94.3 kg (207 lb 14.3 oz) (03/27 1930)    Intake/Output Summary (Last 24 hours) at 05/28/11 1127 Last data filed at 05/28/11 1100  Gross per 24 hour  Intake   1453 ml  Output   2655 ml  Net  -1202 ml    Physical exam Gen: RASS 0. CAM-ICU negative, + F/C HEENT: NCAT PULM: CTA B CV: RRR, no mgr AB: BS+, soft, nontender, no hsm Ext: warm, trace  edema Neuro: no deficits detected   LAB RESULT Lab Results  Component Value Date   CREATININE 0.81 05/28/2011   BUN 10 05/28/2011   NA 141 05/28/2011   K 3.3* 05/28/2011   CL 101 05/28/2011   CO2 30 05/28/2011     Assessment and Plan  71 y/o female with new left temporal lobe lesion, seizures and delirium who was intubated for airway protection on 3/24 after seizures and receiving benzodiazepines.  HSV encephalitis - PCR positive. Neuro and ID following -Cont IV Acyclovir - discussed with Dr Linus Salmons. Needs to remain on IV and complete 10-14 days. Might need to consider PICC to complete therapy as outpt   Acute Respiratory failure due to neurological process:  - Resolved  UTI: 3/18 pan sensitive E.coli -Cftrx D/C'd   PAF (very brief) >> Converted 3/25 AM to NSR, sinus brady with prolonged QT -amiodarone gtt started 3/24 >. D/C'd 3/25 -No eval indicated @ this time  Htn - Cont PRN hydralazine  DM2 - controlled -Lantus dose and SSI adjusted to reflect initiation of diet (CHO mod diet started 3/28)  Seizure: likely due to L temporal lobe lesions -Change phenytoin to PO. Neurology needs to address duration of therapy   CC time today 30 minutes. Sister updated @ bedside  Transfer to med-surg bed and back to [facility] (discussed with Dr Wynelle Cleveland). PCCM will sign off as of 3/29 AM, Please call if we can be of further assistance    Merton Border, MD;  PCCM service; Mobile [phone]

## 2011-05-28 NOTE — Progress Notes (Signed)
INFECTIOUS DISEASE PROGRESS NOTE    Date of Admission:  05/22/2011   Total days of antibiotics 7        Day 6 acyclovir        Ceftriaxone 3/22-25         Principal Problem:  *Toxic metabolic encephalopathy Active Problems:  DIABETES MELLITUS, TYPE II  HYPERLIPIDEMIA  PERIPHERAL NEUROPATHY  HYPERTENSION  Atrial fibrillation  Hypokalemia  E. coli UTI (urinary tract infection)  Seizure  Acute respiratory failure with hypoxia      . acyclovir  10 mg/kg (Ideal) Intravenous Q8H  . albuterol      . aspirin  81 mg Per Tube Daily  . diltiazem  5 mg Intravenous Once  . diltiazem  30 mg Oral Q6H  . enoxaparin (LOVENOX) injection  40 mg Subcutaneous Q24H  . insulin aspart  0-20 Units Subcutaneous TID WC  . insulin aspart  0-5 Units Subcutaneous QHS  . insulin aspart  4 Units Subcutaneous TID WC  . insulin glargine  30 Units Subcutaneous BID  . phenytoin  150 mg Oral TID  . potassium chloride  40 mEq Oral TID  . DISCONTD: antiseptic oral rinse  15 mL Mouth Rinse QID  . DISCONTD: chlorhexidine  15 mL Mouth Rinse BID  . DISCONTD: insulin aspart  0-20 Units Subcutaneous Q4H  . DISCONTD: insulin glargine  20 Units Subcutaneous BID  . DISCONTD: phenytoin (DILANTIN) IV  150 mg Intravenous Q8H    Subjective: No complaints, to go to floor bed today  Objective: Temp:  [97.3 F (36.3 C)-98.1 F (36.7 C)] 97.9 F (36.6 C) (03/28 1200) Pulse Rate:  [68-110] 75  (03/28 1200) Resp:  [15-29] 16  (03/28 1500) BP: (130-175)/(47-88) 130/88 mmHg (03/28 1500) SpO2:  [92 %-98 %] 98 % (03/28 1500) FiO2 (%):  [2 %] 2 % (03/27 1630) Weight:  [207 lb 14.3 oz (94.3 kg)] 207 lb 14.3 oz (94.3 kg) (03/27 1930)  General: AAO x 3, nad Skin: no rashes Lungs: CTA B Cor: tachy, irr irr  Lab Results Lab Results  Component Value Date   WBC 10.0 05/28/2011   HGB 12.0 05/28/2011   HCT 37.1 05/28/2011   MCV 89.8 05/28/2011   PLT 269 05/28/2011    Lab Results  Component Value Date   CREATININE 0.81  05/28/2011   BUN 10 05/28/2011   NA 141 05/28/2011   K 3.3* 05/28/2011   CL 101 05/28/2011   CO2 30 05/28/2011    Lab Results  Component Value Date   ALT 21 05/22/2011   AST 26 05/22/2011   ALKPHOS 74 05/22/2011   BILITOT 0.5 05/22/2011       Microbiology: Recent Results (from the past 240 hour(s))  URINE CULTURE     Status: Normal   Collection Time   05/22/11 10:56 AM      Component Value Range Status Comment   Specimen Description URINE, CATHETERIZED   Final    Special Requests NONE   Final    Culture  Setup Time [id number]   Final    Colony Count 45,000 COLONIES/ML   Final    Culture ESCHERICHIA COLI   Final    Report Status 05/24/2011 FINAL   Final    Organism ID, Bacteria ESCHERICHIA COLI   Final   MRSA PCR SCREENING     Status: Normal   Collection Time   05/24/11 12:20 AM      Component Value Range Status Comment   MRSA by PCR NEGATIVE  NEGATIVE  Final   CSF CULTURE     Status: Normal (Preliminary result)   Collection Time   05/25/11 11:20 AM      Component Value Range Status Comment   Specimen Description CSF   Final    Special Requests     Final    Value: ADDED ON 05/26/2011 AT 1154 PER REYNOLDS,LESLIE MD ON 3100 AT 1012 TUBE 2 1.2CC   Gram Stain     Final    Value: CYTOSPIN WBC PRESENT, PREDOMINANTLY MONONUCLEAR     NO ORGANISMS SEEN     Performed at [facility]   Culture NO GROWTH 1 DAY   Final    Report Status PENDING   Incomplete   GRAM STAIN     Status: Normal   Collection Time   05/25/11 11:20 AM      Component Value Range Status Comment   Specimen Description CSF   Final    Special Requests     Final    Value: ADDED ON 05/26/2011 AT 1154 PER REYNOLDS, LESLIE MD ON 3100 AT 1012 TUBE 2 1.2CC   Gram Stain     Final    Value: CYTOSPIN SLIDE     WBC PRESENT, PREDOMINANTLY MONONUCLEAR     NO ORGANISMS SEEN   Report Status 05/26/2011 FINAL   Final     Studies/Results: No results found.   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  No  current seizure activity.  Essentially fully recovered.   Plan:  -continue with acyclovir. I recommend treating her with IV acyclovir for 14 days total from the day the dose was adjusted to the full dose which will end on April 7th.  She needs acyclovir IV at the q 8 dosing.  There is no po substitute with valacyclovir or acyclovir.    Scharlene Gloss, MD

## 2011-05-29 LAB — BASIC METABOLIC PANEL
BUN: 15 mg/dL (ref 6–23)
Chloride: 101 mEq/L (ref 96–112)
GFR calc Af Amer: 43 mL/min — ABNORMAL LOW (ref >90)
Potassium: 4.4 mEq/L (ref 3.5–5.1)
Sodium: 136 mEq/L (ref 135–145)

## 2011-05-29 LAB — GLUCOSE, CAPILLARY
Glucose-Capillary: 191 mg/dL — ABNORMAL HIGH (ref 70–99)
Glucose-Capillary: 153 mg/dL — ABNORMAL HIGH (ref 70–99)

## 2011-05-29 LAB — ARBOVIRUS IGG, CSF: Lacrosse virus encephalitis, IgG: <1:2 {titer}

## 2011-05-29 MED ORDER — WARFARIN SODIUM 5 MG PO TABS
5.0000 mg | ORAL_TABLET | Freq: Once | ORAL | Status: AC
  Administered 2011-05-29: 5 mg via ORAL
  Filled 2011-05-29: qty 1

## 2011-05-29 MED ORDER — FLUCONAZOLE 100 MG PO TABS
100.0000 mg | ORAL_TABLET | Freq: Every day | ORAL | Status: DC
  Administered 2011-05-29 – 2011-05-30 (×2): 100 mg via ORAL
  Filled 2011-05-29 (×2): qty 1

## 2011-05-29 MED ORDER — SODIUM CHLORIDE 0.9 % IV SOLN
INTRAVENOUS | Status: DC
  Administered 2011-05-29 – 2011-06-01 (×6): via INTRAVENOUS
  Administered 2011-06-01: 75 mL/h via INTRAVENOUS
  Administered 2011-06-02: 07:00:00 via INTRAVENOUS

## 2011-05-29 MED ORDER — WARFARIN - PHARMACIST DOSING INPATIENT
Freq: Every day | Status: DC
  Administered 2011-05-29: 17:00:00

## 2011-05-29 MED ORDER — METOPROLOL TARTRATE 12.5 MG HALF TABLET
12.5000 mg | ORAL_TABLET | Freq: Two times a day (BID) | ORAL | Status: DC
  Administered 2011-05-29 – 2011-05-30 (×4): 12.5 mg via ORAL
  Filled 2011-05-29 (×8): qty 1

## 2011-05-29 NOTE — Progress Notes (Signed)
MEDICATION RELATED CONSULT NOTE - FOLLOW UP   Pharmacy Consult for Acyclovir, Phenytoin, Coumadin Indication: Herpes encephalitis, seizures, h/o a fib  Allergies  Allergen Reactions  . Procaine Hcl Anaphylaxis    Patient Measurements: Height: 5\' 8"  (172.7 cm) Weight: 207 lb 14.3 oz (94.3 kg) IBW/kg (Calculated) : 63.9  Adjusted Body Weight:    Vital Signs: Temp: 97.7 F (36.5 C) (03/29 0616) Temp src: Oral (03/29 0616) BP: 141/82 mmHg (03/29 0616) Pulse Rate: 75  (03/29 0616) Intake/Output from previous day: 03/28 0701 - 03/29 0700 In: 680.8 [P.O.:255; I.V.:200; IV Piggyback:225.8] Out: 545 [Urine:545] Intake/Output from this shift:    Labs:  Basename 05/28/11 0410 05/27/11 0428  WBC 10.0 --  HGB 12.0 --  HCT 37.1 --  PLT 269 --  APTT -- --  CREATININE 0.81 1.05  LABCREA -- --  CREATININE 0.81 1.05  CREAT24HRUR -- --  MG -- --  PHOS -- --  ALBUMIN -- --  PROT -- --  ALBUMIN -- --  AST -- --  ALT -- --  ALKPHOS -- --  BILITOT -- --  BILIDIR -- --  IBILI -- --   Estimated Creatinine Clearance: 78.7 ml/min (by C-G formula based on Cr of 0.81).   Microbiology: Recent Results (from the past 720 hour(s))  CULTURE, BLOOD (ROUTINE X 2)     Status: Normal   Collection Time   05/17/11  7:33 PM      Component Value Range Status Comment   Specimen Description BLOOD LEFT ARM   Final    Special Requests     Final    Value: BOTTLES DRAWN AEROBIC AND ANAEROBIC 10CC AEROBIC 8CC ANAEROBIC   Culture  Setup Time [id number]   Final    Culture NO GROWTH 5 DAYS   Final    Report Status 05/24/2011 FINAL   Final   CULTURE, BLOOD (ROUTINE X 2)     Status: Normal   Collection Time   05/17/11  7:38 PM      Component Value Range Status Comment   Specimen Description BLOOD LEFT ARM   Final    Special Requests     Final    Value: BOTTLES DRAWN AEROBIC AND ANAEROBIC 10CC AEROBIC 8CC ANAEROBIC   Culture  Setup Time [id number]   Final    Culture NO GROWTH 5 DAYS   Final      Report Status 05/24/2011 FINAL   Final   URINE CULTURE     Status: Normal   Collection Time   05/18/11  3:02 AM      Component Value Range Status Comment   Specimen Description URINE, RANDOM   Final    Special Requests NONE   Final    Culture  Setup Time [id number]   Final    Colony Count >=100,000 COLONIES/ML   Final    Culture ESCHERICHIA COLI   Final    Report Status 05/20/2011 FINAL   Final    Organism ID, Bacteria ESCHERICHIA COLI   Final   URINE CULTURE     Status: Normal   Collection Time   05/22/11 10:56 AM      Component Value Range Status Comment   Specimen Description URINE, CATHETERIZED   Final    Special Requests NONE   Final    Culture  Setup Time [id number]   Final    Colony Count 45,000 COLONIES/ML   Final    Culture ESCHERICHIA COLI   Final    Report Status 05/24/2011 FINAL  Final    Organism ID, Bacteria ESCHERICHIA COLI   Final   MRSA PCR SCREENING     Status: Normal   Collection Time   05/24/11 12:20 AM      Component Value Range Status Comment   MRSA by PCR NEGATIVE  NEGATIVE  Final   CSF CULTURE     Status: Normal (Preliminary result)   Collection Time   05/25/11 11:20 AM      Component Value Range Status Comment   Specimen Description CSF   Final    Special Requests     Final    Value: ADDED ON 05/26/2011 AT 1154 PER REYNOLDS,LESLIE MD ON 3100 AT 1012 TUBE 2 1.2CC   Gram Stain     Final    Value: CYTOSPIN WBC PRESENT, PREDOMINANTLY MONONUCLEAR     NO ORGANISMS SEEN     Performed at [facility]   Culture NO GROWTH 1 DAY   Final    Report Status PENDING   Incomplete   GRAM STAIN     Status: Normal   Collection Time   05/25/11 11:20 AM      Component Value Range Status Comment   Specimen Description CSF   Final    Special Requests     Final    Value: ADDED ON 05/26/2011 AT 1154 PER REYNOLDS, LESLIE MD ON 3100 AT 1012 TUBE 2 1.2CC   Gram Stain     Final    Value: CYTOSPIN SLIDE     WBC PRESENT, PREDOMINANTLY MONONUCLEAR     NO ORGANISMS  SEEN   Report Status 05/26/2011 FINAL   Final    Assessment: Assessment: Confusion, nausea and headache on admit.  Anticoagulation: Coumadin for afib -d/c'd 3/23 for LP (s/p Vitamin K 5mg  IV 3/24). Plan to resume Coumadin today. Last INR 1.63 on 3/26). Resume Coumadin 5mg  today.  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  (LP=elevated protein and WBC with lymphocyte predominance). Ecoli UTI treated with Rocephin. WBC down 10. Afebrile.  3/24 Acyclovir (RX)>> 3/22 CTX>>> 3/25  Cardiovascular: +Afib, HTN, HLD: Max BP 169/64, HR 71-115 Meds: cardiac meds d/c'd  Endocrinology: CBGs 119-232. Meds:, SSI, Lantus 20 bid.  Gastrointestinal / Nutrition: NPO. Tube PPI K = replaced up to 4.4  Neurology: Confusion, worrisome area on MRI of brain; new L temporal lobe lesion with seizures .(Dilantin level 6.8 to 8 to 5.9 this am). Maintenance dose of Dilantin was increased 3/28. Check another level next Wednesday (after 1 week).  Nephrology: SrCr 0.81 up to 1.4? UOP only 680cc recorded yesterday.  Pulmonary: intubated 3/24 s/p seizures, delirium w/ benzos. Extubated 3/27.   Hematology / Oncology: CBC OK  Plan: 1. Coumadin 5mg  po x 1 tonight. 2. Acyclovir 10mg /kg/8h through 4/7 per ID note (14d) 3. Increased maintenance dose to 150mg  TID (5mg /kg/d). Level about next Wed.  Goal of Therapy:  INR 2-3 Dilantin level 10-20  Plan:  Coumadin 5mg  po x 1 today. Acyclovir 640mg  IV q8h Dilantin 150mg  TID  Eilene Ghazi Stillinger 05/29/2011,10:43 AM

## 2011-05-29 NOTE — Progress Notes (Signed)
   CARE MANAGEMENT NOTE 05/29/2011  Patient:  CASIA, PLA   Account Number:  [id number]  Date Initiated:  05/29/2011  Documentation initiated by:  Marvetta Gibbons  Subjective/Objective Assessment:   Pt admitted with toxic metabolic encephalopathy     Action/Plan:   PTA pt lived at home with family, PT/OT evals ordered, LTAC referral   Anticipated DC Date:  05/30/2011   Anticipated DC Plan:  LONG TERM ACUTE CARE (LTAC)  In-house referral  Clinical Social Worker      DC Planning Services  CM consult      Choice offered to / List presented to:             Status of service:  In process, will continue to follow Medicare Important Message given?   (If response is "NO", the following Medicare IM given date fields will be blank) Date Medicare IM given:   Date Additional Medicare IM given:    Discharge Disposition:    Per UR Regulation:    If discussed at Long Length of Stay Meetings, dates discussed:    Comments:  PCP- [name]  05/29/11- [place] RN, BSN [phone] Referral made to [facility]- d/c planning LTAC vs SNF. CM to follow.

## 2011-05-29 NOTE — Evaluation (Signed)
Physical Therapy Evaluation Patient Details Name: [name] [name] MRN: [id number] DOB: 1940-11-12 Today's Date: 05/29/2011  Problem List:  Patient Active Problem List  Diagnoses  . DIABETES MELLITUS, TYPE II  . HYPERLIPIDEMIA  . PERIPHERAL NEUROPATHY  . RETINOPATHY, BACKGROUND NOS  . HYPERTENSION  . LUNG NODULE  . OSTEOPENIA  . Atrial fibrillation  . Encounter for long-term (current) use of anticoagulants  . General medical examination  . Hypokalemia  . Toxic metabolic encephalopathy  . E. coli UTI (urinary tract infection)  . Seizure  . Acute respiratory failure with hypoxia    Past Medical History:  Past Medical History  Diagnosis Date  . Blindness of left eye   . Eye muscle weakness     Right eye weakness after cataract surgery  . Common migraine     ?hx of  . DIABETES MELLITUS, TYPE II 03/27/2006  . HYPERLIPIDEMIA 03/27/2006  . 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 HYPERTENSION 03/27/2006  . Atrial fibrillation     SVT dx 2007, cath 2007 mild CAD, then had an cardioversion, ablation; still on coumadin , has occ palpitation, EKG 03-2010 NSR  . Headache 05/07/2009  . LUNG NODULE 09/01/2006    Excision, Bx Benighn  . Osteopenia 2004    Dexa 2004 showed Osteopenia, DEXA 03/2007 normal   Past Surgical History: History reviewed. No pertinent past surgical history.  PT Assessment/Plan/Recommendation PT Assessment Clinical Impression Statement: Pt readmitted with HSV encephalitis.  Pt presents with sigificant cognitive problems as well as weakness.  Pt will need post acute [facility] at [facility] or SNF. PT Recommendation/Assessment: Patient will need skilled PT in the acute care venue PT Problem List: Decreased strength;Decreased activity tolerance;Decreased balance;Decreased mobility;Decreased knowledge of use of DME;Decreased cognition;Decreased safety awareness;Decreased knowledge of precautions PT Therapy Diagnosis : Difficulty walking;Altered mental status PT Plan PT  Frequency: Min 2X/week PT Treatment/Interventions: DME instruction;Gait training;Functional mobility training;Therapeutic activities;Therapeutic exercise;Balance training;Patient/family education;Cognitive remediation PT Recommendation Recommendations for Other Services: OT consult Follow Up Recommendations: LTACH;Skilled nursing facility Equipment Recommended: Defer to next venue PT Goals  Acute Rehab PT Goals PT Goal Formulation: With family Time For Goal Achievement: 7 days Pt will go Supine/Side to Sit: with modified independence PT Goal: Supine/Side to Sit - Progress: Goal set today Pt will go Sit to Supine/Side: with modified independence PT Goal: Sit to Supine/Side - Progress: Goal set today Pt will go Sit to Stand: with min assist PT Goal: Sit to Stand - Progress: Goal set today Pt will go Stand to Sit: with min assist PT Goal: Stand to Sit - Progress: Goal set today Pt will Ambulate: 16 - 50 feet;with min assist;with least restrictive assistive device PT Goal: Ambulate - Progress: Goal set today  PT Evaluation Precautions/Restrictions  Precautions Precautions: Fall Prior Functioning  Home Living Lives With: Spouse;Family Type of Home: House Home Layout: Multi-level Alternate Level Stairs-Rails: Left;Right Alternate Level Stairs-Number of Steps: flight Home Access: Stairs to enter Home Adaptive Equipment: None Prior Function Level of Independence: Independent with basic ADLs;Independent with transfers;Independent with gait;Independent with homemaking with ambulation (prior to earlier hospitalization in March) Driving: No Cognition Cognition Arousal/Alertness: Awake/alert Overall Cognitive Status: Impaired Attention: Impaired Current Attention Level: Focused Attention - Other Comments: Pt perseverates Memory: Appears impaired Orientation Level: Oriented to person;Disoriented to place;Disoriented to time Following Commands: Follows one step commands  consistently Safety/Judgement: Decreased awareness of safety precautions;Decreased safety judgement for tasks assessed Decreased Safety/Judgement: Impulsive;Decreased awareness of need for assistance Awareness of Deficits: Decreased awareness of deficits Problem Solving: Requires  assistance for problem solving Sensation/Coordination   Extremity Assessment RLE Strength RLE Overall Strength Comments: grossly 3+/5 LLE Strength LLE Overall Strength Comments: grossly 3+/5 Mobility (including Balance) Bed Mobility Supine to Sit: 5: Supervision;With rails;HOB elevated (Comment degrees) (HOB 20 degrees) Supine to Sit Details (indicate cue type and reason): supervision for safety Sitting - Scoot to Edge of Bed: 5: Supervision Transfers Sit to Stand: 1: +2 Total assist;Patient percentage (comment) (pt = 60%) Sit to Stand Details (indicate cue type and reason): Stood in flexed postion for 5 seconds. Squat Pivot Transfers: 1: +2 Total assist;Patient percentage (comment) (pt = 60%) Squat Pivot Transfer Details (indicate cue type and reason): Pt bilateral support at arms and hips.  Pt with constant verbal/tactile cues to wait until we were ready for transfer.  Wanted to use [facility] but pt adamently refusing.    Exercise    End of Session PT - End of Session Equipment Utilized During Treatment: Gait belt Activity Tolerance: Other (comment) (pt limited by decr coginition) Patient left: in chair;with call bell in reach;with family/visitor present (chair alarm on) Nurse Communication: Mobility status for transfers  [facility] 05/29/2011, 11:16 AM  [place]

## 2011-05-29 NOTE — Progress Notes (Signed)
Contact- Monay Saxer (spouse)- [phone] cell  05/29/11- 1445- Marvetta Gibbons RN, BSN [phone] Spoke with pt's husband at bedside Jenny Reichmann) regarding d/c plans. Per conversation he would prefer [facility] if pt is eligible for that option- is ok with second option of [facility]-SNF. CSW to come talk with him regarding- SNF option. Per Sonia Baller with [facility])- there are no beds available today- will reassess on Monday April 1.

## 2011-05-29 NOTE — Progress Notes (Signed)
Patient ID: [name] [name], female   DOB: 08-11-1940, 71 y.o.   MRN: [id number]  Transfer from PCCM on 05/29/11   Subjective:  Patient with no complaints.  Husband and sister present.  Husband reports sometimes she recognizes him and sometimes she does not.  Objective: Weight change:   Intake/Output Summary (Last 24 hours) at 05/29/11 0749 Last data filed at 05/28/11 2144  Gross per 24 hour  Intake  680.8 ml  Output    545 ml  Net  135.8 ml   Blood pressure 141/82, pulse 75, temperature 97.7 F (36.5 C), temperature source Oral, resp. rate 22, height 5\' 8"  (1.727 m), weight 94.3 kg (207 lb 14.3 oz), SpO2 97.00%. Filed Vitals:   05/28/11 1600 05/28/11 1716 05/28/11 2109 05/29/11 0616  BP: 149/70 156/89 150/82 141/82  Pulse:  115 79 75  Temp: 98.1 F (36.7 C) 97.7 F (36.5 C) 97.7 F (36.5 C) 97.7 F (36.5 C)  TempSrc: Oral Oral Oral Oral  Resp: 23 23 24 22   Height:      Weight:      SpO2: 97% 95% 95% 97%    Physical Exam: General: No acute distress, right eyelid droops slightly. Lungs: Clear to auscultation bilaterally without wheezes or crackles Cardiovascular: Regular rate and rhythm without murmur gallop or rub normal S1 and S2.  Non Sustained VT at 4 am 3/29. Abdomen: Nontender, nondistended, soft, bowel sounds positive, no rebound, no ascites, no appreciable mass Extremities: No significant cyanosis, clubbing, or edema bilateral lower extremities  Basic Metabolic Panel:  Lab [id number] 0410 05/27/11 0428  NA 141 141  K 3.3* 3.6  CL 101 106  CO2 30 28  GLUCOSE 120* 201*  BUN 10 16  CREATININE 0.81 1.05  CALCIUM 8.6 8.6  MG -- --  PHOS -- --   Liver Function Tests:  Lab 05/25/11 1651 05/22/11 1015  AST -- 26  ALT -- 21  ALKPHOS -- 74  BILITOT -- 0.5  PROT -- 7.1  ALBUMIN 2.3* 2.9*   No results found for this basename: LIPASE:2,AMYLASE:2 in the last 168 hours  Lab 05/22/11 0949  AMMONIA 39   CBC:  Lab 05/28/11 0410 05/26/11 0451 05/22/11 1015   WBC 10.0 10.3 --  NEUTROABS -- -- 10.6*  HGB 12.0 11.8* --  HCT 37.1 35.3* --  MCV 89.8 87.6 --  PLT 269 233 --   CBG:  Lab 05/28/11 2140 05/28/11 1736 05/28/11 1210 05/28/11 0824 05/28/11 0412 05/28/11 0032  GLUCAP 232* 119* 125* 145* 119* 120*   Coagulation:  Lab 05/26/11 0451 05/25/11 0436 05/24/11 1120 05/24/11 0351  LABPROT 19.6* 17.2* 16.7* 23.2*  INR 1.63* 1.38 1.33 2.02*    Micro Results: Recent Results (from the past 240 hour(s))  URINE CULTURE     Status: Normal   Collection Time   05/22/11 10:56 AM      Component Value Range Status Comment   Specimen Description URINE, CATHETERIZED   Final    Special Requests NONE   Final    Culture  Setup Time [id number]   Final    Colony Count 45,000 COLONIES/ML   Final    Culture ESCHERICHIA COLI   Final    Report Status 05/24/2011 FINAL   Final    Organism ID, Bacteria ESCHERICHIA COLI   Final   MRSA PCR SCREENING     Status: Normal   Collection Time   05/24/11 12:20 AM      Component Value Range Status Comment  MRSA by PCR NEGATIVE  NEGATIVE  Final   CSF CULTURE     Status: Normal (Preliminary result)   Collection Time   05/25/11 11:20 AM      Component Value Range Status Comment   Specimen Description CSF   Final    Special Requests     Final    Value: ADDED ON 05/26/2011 AT 1154 PER REYNOLDS,LESLIE MD ON 3100 AT 1012 TUBE 2 1.2CC   Gram Stain     Final    Value: CYTOSPIN WBC PRESENT, PREDOMINANTLY MONONUCLEAR     NO ORGANISMS SEEN     Performed at [facility]   Culture NO GROWTH 1 DAY   Final    Report Status PENDING   Incomplete   GRAM STAIN     Status: Normal   Collection Time   05/25/11 11:20 AM      Component Value Range Status Comment   Specimen Description CSF   Final    Special Requests     Final    Value: ADDED ON 05/26/2011 AT 1154 PER REYNOLDS, LESLIE MD ON 3100 AT 1012 TUBE 2 1.2CC   Gram Stain     Final    Value: CYTOSPIN SLIDE     WBC PRESENT, PREDOMINANTLY MONONUCLEAR     NO  ORGANISMS SEEN   Report Status 05/26/2011 FINAL   Final     Studies/Results: Scheduled Meds:   . acyclovir  10 mg/kg (Ideal) Intravenous Q8H  . aspirin  81 mg Per Tube Daily  . diltiazem  5 mg Intravenous Once  . diltiazem  30 mg Oral Q6H  . enoxaparin (LOVENOX) injection  40 mg Subcutaneous Q24H  . insulin aspart  0-20 Units Subcutaneous TID WC  . insulin aspart  0-5 Units Subcutaneous QHS  . insulin aspart  4 Units Subcutaneous TID WC  . insulin glargine  30 Units Subcutaneous BID  . phenytoin  150 mg Oral TID  . potassium chloride  40 mEq Oral TID  . DISCONTD: antiseptic oral rinse  15 mL Mouth Rinse QID  . DISCONTD: chlorhexidine  15 mL Mouth Rinse BID  . DISCONTD: insulin aspart  0-20 Units Subcutaneous Q4H  . DISCONTD: insulin glargine  20 Units Subcutaneous BID  . DISCONTD: phenytoin (DILANTIN) IV  150 mg Intravenous Q8H   Continuous Infusions:   . DISCONTD: dextrose 5 % and 0.45 % NaCl with KCl 20 mEq/L 50 mL/hr at 05/28/11 0700   PRN Meds:.acetaminophen, fentaNYL, DISCONTD: acetaminophen, DISCONTD: albuterol, DISCONTD: hydrALAZINE, DISCONTD: labetalol, DISCONTD: metoprolol, DISCONTD: ondansetron  Anti-infectives:  Anti-infectives     Start     Dose/Rate Route Frequency Ordered Stop   05/25/11 1400   acyclovir (ZOVIRAX) 640 mg in dextrose 5 % 100 mL IVPB        10 mg/kg  63.9 kg (Ideal) 112.8 mL/hr over 60 Minutes Intravenous 3 times per day 05/25/11 1035     05/24/11 0030   acyclovir (ZOVIRAX) 500 mg in dextrose 5 % 100 mL IVPB  Status:  Discontinued        10 mg/kg  50.1 kg (Ideal) 110 mL/hr over 60 Minutes Intravenous 3 times per day 05/23/11 2345 05/25/11 1035   05/23/11 1130   cefTRIAXone (ROCEPHIN) 1 g in dextrose 5 % 50 mL IVPB  Status:  Discontinued        1 g 100 mL/hr over 30 Minutes Intravenous Every 24 hours 05/23/11 1041 05/25/11 1906   05/22/11 1315   cefTRIAXone (ROCEPHIN) 1 g  in dextrose 5 % 50 mL IVPB        1 g 100 mL/hr over 30 Minutes  Intravenous  Once 05/22/11 1303 05/22/11 1346          Assessment/Plan: Principal Problem:  *Toxic metabolic encephalopathy Active Problems:  DIABETES MELLITUS, TYPE II  HYPERLIPIDEMIA  PERIPHERAL NEUROPATHY  HYPERTENSION  Atrial fibrillation  Hypokalemia  E. coli UTI (urinary tract infection)  Seizure  Acute respiratory failure with hypoxia   Assessment and Plan  71 y/o female with new left temporal lobe lesion, seizures and delirium who was intubated for airway protection on 3/24 after seizures and receiving benzodiazepines. She was extubated on 3/27.  Toxic metabolic encephalopathy - patient is awake and gives short appropriate answers.  At times she is confused but it appears the encephalopathy is slowly resolving.  HSV encephalitis - PCR positive. Neuro and ID following  -Cont IV Acyclovir - discussed with Dr Linus Salmons. Needs to remain on IV and complete 14 days. Might need to consider PICC to complete therapy as outpt   Acute Respiratory failure VDRF  resolved -due to neurological process:   UTI: 3/18 pan sensitive E.coli  -Cftrx D/C'd   PAF (very brief) >> Converted 3/25 AM to NSR, sinus brady with prolonged QT  -amiodarone gtt started 3/24 >. D/C'd 3/25  -No eval indicated @ this time  -On home dose of diltiazem and will restart metoprolol. -resume coumadin- next few days-as clinical improvement continues  AKI -monitor creatinine-slight increase today  Htn  - Restart metoprolol at home dose.  Cont PRN hydralazine   DM2 - controlled  -Lantus dose and SSI adjusted to reflect initiation of diet (CHO mod diet started 3/28)   Seizure: likely due to L temporal lobe lesions.  -continue with dilantin  Vaginal Yeast Infection - diflucan started 3/29.  DVT prophylaxis:  Lovenox  Dispo:  Will ask [facility] to evaluate for LTACH placement.  Whether Ltach or SNF d/c is likely on Monday.    LOS: 7 days   Melton Alar 05/29/2011, 7:49  AM [phone]  Attending -patient has been seen and examined, agree with assessment and plan as outlined by Ms [place].

## 2011-05-29 NOTE — Progress Notes (Signed)
INFECTIOUS DISEASE PROGRESS NOTE    Date of Admission:  05/22/2011   Total days of antibiotics 8        Day 7 acyclovir        Ceftriaxone 3/22-25         Principal Problem:  *Toxic metabolic encephalopathy Active Problems:  DIABETES MELLITUS, TYPE II  HYPERLIPIDEMIA  PERIPHERAL NEUROPATHY  HYPERTENSION  Atrial fibrillation  Hypokalemia  E. coli UTI (urinary tract infection)  Seizure  Acute respiratory failure with hypoxia      . acyclovir  10 mg/kg (Ideal) Intravenous Q8H  . aspirin  81 mg Per Tube Daily  . diltiazem  5 mg Intravenous Once  . diltiazem  30 mg Oral Q6H  . enoxaparin (LOVENOX) injection  40 mg Subcutaneous Q24H  . insulin aspart  0-20 Units Subcutaneous TID WC  . insulin aspart  0-5 Units Subcutaneous QHS  . insulin aspart  4 Units Subcutaneous TID WC  . insulin glargine  30 Units Subcutaneous BID  . phenytoin  150 mg Oral TID  . potassium chloride  40 mEq Oral TID  . DISCONTD: insulin aspart  0-20 Units Subcutaneous Q4H  . DISCONTD: insulin glargine  20 Units Subcutaneous BID  . DISCONTD: phenytoin (DILANTIN) IV  150 mg Intravenous Q8H    Subjective: No complaints, to go to floor bed today  Objective: Temp:  [97.7 F (36.5 C)-98.1 F (36.7 C)] 97.7 F (36.5 C) (03/29 0616) Pulse Rate:  [75-115] 75  (03/29 0616) Resp:  [15-24] 22  (03/29 0616) BP: (130-156)/(57-89) 141/82 mmHg (03/29 0616) SpO2:  [95 %-98 %] 97 % (03/29 0616)  General: AAO x 3, nad Skin: no rashes Lungs: CTA B Cor: tachy, irr irr  Lab Results Lab Results  Component Value Date   WBC 10.0 05/28/2011   HGB 12.0 05/28/2011   HCT 37.1 05/28/2011   MCV 89.8 05/28/2011   PLT 269 05/28/2011    Lab Results  Component Value Date   CREATININE 0.81 05/28/2011   BUN 10 05/28/2011   NA 141 05/28/2011   K 3.3* 05/28/2011   CL 101 05/28/2011   CO2 30 05/28/2011    Lab Results  Component Value Date   ALT 21 05/22/2011   AST 26 05/22/2011   ALKPHOS 74 05/22/2011   BILITOT 0.5 05/22/2011         Microbiology: Recent Results (from the past 240 hour(s))  URINE CULTURE     Status: Normal   Collection Time   05/22/11 10:56 AM      Component Value Range Status Comment   Specimen Description URINE, CATHETERIZED   Final    Special Requests NONE   Final    Culture  Setup Time [id number]   Final    Colony Count 45,000 COLONIES/ML   Final    Culture ESCHERICHIA COLI   Final    Report Status 05/24/2011 FINAL   Final    Organism ID, Bacteria ESCHERICHIA COLI   Final   MRSA PCR SCREENING     Status: Normal   Collection Time   05/24/11 12:20 AM      Component Value Range Status Comment   MRSA by PCR NEGATIVE  NEGATIVE  Final   CSF CULTURE     Status: Normal (Preliminary result)   Collection Time   05/25/11 11:20 AM      Component Value Range Status Comment   Specimen Description CSF   Final    Special Requests     Final  Value: ADDED ON 05/26/2011 AT 1154 PER REYNOLDS,LESLIE MD ON 3100 AT 1012 TUBE 2 1.2CC   Gram Stain     Final    Value: CYTOSPIN WBC PRESENT, PREDOMINANTLY MONONUCLEAR     NO ORGANISMS SEEN     Performed at [facility]   Culture NO GROWTH 1 DAY   Final    Report Status PENDING   Incomplete   GRAM STAIN     Status: Normal   Collection Time   05/25/11 11:20 AM      Component Value Range Status Comment   Specimen Description CSF   Final    Special Requests     Final    Value: ADDED ON 05/26/2011 AT 1154 PER REYNOLDS, LESLIE MD ON 3100 AT 1012 TUBE 2 1.2CC   Gram Stain     Final    Value: CYTOSPIN SLIDE     WBC PRESENT, PREDOMINANTLY MONONUCLEAR     NO ORGANISMS SEEN   Report Status 05/26/2011 FINAL   Final     Studies/Results: No results found.   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  No current seizure activity.  Essentially fully recovered.   Plan:  -continue with acyclovir. I recommend treating her with IV acyclovir for 14 days total from the day the dose was adjusted to the full dose which will end on April 7th.  She  needs acyclovir IV at the q 8 dosing.  There is no po substitute with valacyclovir or acyclovir.    -She most likely will not be able to get acyclovir by home health so will need to be in a facility, such as rehab or a short term stay somewhere.    I available if needed.   Scharlene Gloss, MD

## 2011-05-29 NOTE — Progress Notes (Signed)
Clinical Social Work Department BRIEF PSYCHOSOCIAL ASSESSMENT 05/29/2011  Patient:  [name] [name], [name] [name]     Account Number:  [id number]     Admit date:  05/22/2011  Clinical Social Worker:  Earlie Server  Date/Time:  05/29/2011 02:15 PM  Referred by:  Physician  Date Referred:  05/29/2011 Referred for  SNF Placement   Other Referral:   Interview type:  Patient Other interview type:   Husband present    PSYCHOSOCIAL DATA Living Status:  HUSBAND Admitted from facility:   Level of care:   Primary support name:  [name] Primary support relationship to patient:  SPOUSE Degree of support available:   Adequate    CURRENT CONCERNS Current Concerns  Post-Acute Placement   Other Concerns:    SOCIAL WORK ASSESSMENT / PLAN CSW received call from CM regarding having a back up plan in case patient was unable to go to LTAC. CSW met with patient and husband at bedside. CSW spoke with family regarding the option of SNF. Family reported LTAC was first option but would like to have SNF as a back up plan. CSW explained CSW role and SNF process. CSW received permission to fax out to [facility]. CSW submitted pasarr and completed FL2. CSW will continue to follo   Assessment/plan status:  Psychosocial Support/Ongoing Assessment of Needs Other assessment/ plan:   Information/referral to community resources:   SNF list    PATIENT'S/FAMILY'S RESPONSE TO PLAN OF CARE: Patient was alert and oriented. Husband was engaged during assessment. Family lives in [place] and prefers a location close to home. Patient and family agreeable to SNF search.

## 2011-05-29 NOTE — Plan of Care (Signed)
Problem: Phase II Progression Outcomes Goal: Progress activity as tolerated unless otherwise ordered Outcome: Progressing PT worked with pt-then to chair. Goal: Vital signs remain stable, temperature < 100 Outcome: Progressing B/P elevated -new B/P med added. Goal: Discharge plan established Outcome: [name] [name]. Referral or SNF.

## 2011-05-29 NOTE — Progress Notes (Addendum)
Clinical Social Work Department CLINICAL SOCIAL WORK PLACEMENT NOTE 05/29/2011  Patient:  [name] [name], [name] [name]  Account Number:  [id number] Admit date:  05/22/2011  Clinical Social Worker:  Sindy Messing, LCSW  Date/time:  05/29/2011 02:15 PM  Clinical Social Work is seeking post-discharge placement for this patient at the following level of care:   [place]   (*CSW will update this form in Epic as items are completed)   05/29/2011  Patient/family provided with [facility] Department of Clinical Social Work's list of facilities offering this level of care within the geographic area requested by the patient (or if unable, by the patient's family).  05/29/2011  Patient/family informed of their freedom to choose among providers that offer the needed level of care, that participate in Medicare, Medicaid or managed care program needed by the patient, have an available bed and are willing to accept the patient.  05/29/2011  Patient/family informed of [facility]' ownership interest in [facility], as well as of the fact that they are under no obligation to receive care at this facility.  PASARR submitted to [employer] on 05/29/2011 PASARR number received from [employer] on   FL2 transmitted to all facilities in geographic area requested by pt/family on  05/29/2011 FL2 transmitted to all facilities within larger geographic area on   Patient informed that his/her managed care company has contracts with or will negotiate with  certain facilities, including the following:     Patient/family informed of bed offers received: 05/30/2011  Patient chooses bed at [place] and [place] Physician recommends and patient chooses bed at    Patient to be transferred to [place]   on  06/02/2011   Patient to be transferred to facility by [facility]   The following physician request were entered in Epic:   Additional Comments:  .Dorathy Kinsman, [place]  .[id number] pm

## 2011-05-30 ENCOUNTER — Inpatient Hospital Stay (HOSPITAL_COMMUNITY): Payer: Medicare Other

## 2011-05-30 LAB — BASIC METABOLIC PANEL
BUN: 16 mg/dL (ref 6–23)
Calcium: 8.9 mg/dL (ref 8.4–10.5)
Creatinine, Ser: 1.79 mg/dL — ABNORMAL HIGH (ref 0.50–1.10)
GFR calc non Af Amer: 28 mL/min — ABNORMAL LOW (ref >90)
Glucose, Bld: 98 mg/dL (ref 70–99)
Sodium: 136 mEq/L (ref 135–145)

## 2011-05-30 LAB — GLUCOSE, CAPILLARY: Glucose-Capillary: 89 mg/dL (ref 70–99)

## 2011-05-30 LAB — CSF CULTURE W GRAM STAIN: Culture: NO GROWTH

## 2011-05-30 LAB — PROTIME-INR
INR: 2.45 — ABNORMAL HIGH (ref 0.00–1.49)
Prothrombin Time: 27 seconds — ABNORMAL HIGH (ref 11.6–15.2)

## 2011-05-30 MED ORDER — DEXTROSE 5 % IV SOLN
10.0000 mg/kg | Freq: Two times a day (BID) | INTRAVENOUS | Status: DC
  Administered 2011-05-30 – 2011-06-01 (×4): 640 mg via INTRAVENOUS
  Filled 2011-05-30 (×5): qty 12.8

## 2011-05-30 MED ORDER — WARFARIN SODIUM 2.5 MG PO TABS
2.5000 mg | ORAL_TABLET | Freq: Once | ORAL | Status: AC
  Administered 2011-05-30: 2.5 mg via ORAL
  Filled 2011-05-30: qty 1

## 2011-05-30 NOTE — Progress Notes (Signed)
MEDICATION RELATED CONSULT NOTE - FOLLOW UP   Pharmacy Consult for Acyclovir, Phenytoin, Coumadin Indication: Herpes encephalitis, seizures, h/o a fib  Allergies  Allergen Reactions  . Procaine Hcl Anaphylaxis    Patient Measurements: Height: 5\' 8"  (172.7 cm) Weight: 207 lb 14.3 oz (94.3 kg) IBW/kg (Calculated) : 63.9   Vital Signs: Temp: 97.5 F (36.4 C) (03/30 0443) Temp src: Oral (03/30 0443) BP: 178/75 mmHg (03/30 0443) Pulse Rate: 63  (03/30 0443) Intake/Output from previous day: 03/29 0701 - 03/30 0700 In: 1092.8 [P.O.:580; I.V.:400; IV Piggyback:112.8] Out: 2400 [Urine:2400] Intake/Output from this shift: Total I/O In: 240 [P.O.:240] Out: 1000 [Urine:1000]  Labs:  Basename 05/30/11 0630 05/29/11 0940 05/28/11 0410  WBC -- -- 10.0  HGB -- -- 12.0  HCT -- -- 37.1  PLT -- -- 269  APTT -- -- --  CREATININE 1.79* 1.40* 0.81  LABCREA -- -- --  CREATININE 1.79* 1.40* 0.81  CREAT24HRUR -- -- --  MG -- -- --  PHOS -- -- --  ALBUMIN -- -- --  PROT -- -- --  ALBUMIN -- -- --  AST -- -- --  ALT -- -- --  ALKPHOS -- -- --  BILITOT -- -- --  BILIDIR -- -- --  IBILI -- -- --   Estimated Creatinine Clearance: 35.6 ml/min (by C-G formula based on Cr of 1.79).   Microbiology: Recent Results (from the past 720 hour(s))  CULTURE, BLOOD (ROUTINE X 2)     Status: Normal   Collection Time   05/17/11  7:33 PM      Component Value Range Status Comment   Specimen Description BLOOD LEFT ARM   Final    Special Requests     Final    Value: BOTTLES DRAWN AEROBIC AND ANAEROBIC 10CC AEROBIC 8CC ANAEROBIC   Culture  Setup Time [id number]   Final    Culture NO GROWTH 5 DAYS   Final    Report Status 05/24/2011 FINAL   Final   CULTURE, BLOOD (ROUTINE X 2)     Status: Normal   Collection Time   05/17/11  7:38 PM      Component Value Range Status Comment   Specimen Description BLOOD LEFT ARM   Final    Special Requests     Final    Value: BOTTLES DRAWN AEROBIC AND  ANAEROBIC 10CC AEROBIC 8CC ANAEROBIC   Culture  Setup Time [id number]   Final    Culture NO GROWTH 5 DAYS   Final    Report Status 05/24/2011 FINAL   Final   URINE CULTURE     Status: Normal   Collection Time   05/18/11  3:02 AM      Component Value Range Status Comment   Specimen Description URINE, RANDOM   Final    Special Requests NONE   Final    Culture  Setup Time [id number]   Final    Colony Count >=100,000 COLONIES/ML   Final    Culture ESCHERICHIA COLI   Final    Report Status 05/20/2011 FINAL   Final    Organism ID, Bacteria ESCHERICHIA COLI   Final   URINE CULTURE     Status: Normal   Collection Time   05/22/11 10:56 AM      Component Value Range Status Comment   Specimen Description URINE, CATHETERIZED   Final    Special Requests NONE   Final    Culture  Setup Time [id number]   Final    Colony  Count 45,000 COLONIES/ML   Final    Culture ESCHERICHIA COLI   Final    Report Status 05/24/2011 FINAL   Final    Organism ID, Bacteria ESCHERICHIA COLI   Final   MRSA PCR SCREENING     Status: Normal   Collection Time   05/24/11 12:20 AM      Component Value Range Status Comment   MRSA by PCR NEGATIVE  NEGATIVE  Final   CSF CULTURE     Status: Normal   Collection Time   05/25/11 11:20 AM      Component Value Range Status Comment   Specimen Description CSF   Final    Special Requests     Final    Value: ADDED ON 05/26/2011 AT 1154 PER REYNOLDS,LESLIE MD ON 3100 AT 1012 TUBE 2 1.2CC   Gram Stain     Final    Value: CYTOSPIN WBC PRESENT, PREDOMINANTLY MONONUCLEAR     NO ORGANISMS SEEN     Performed at [facility]   Culture NO GROWTH 3 DAYS   Final    Report Status 05/30/2011 FINAL   Final   GRAM STAIN     Status: Normal   Collection Time   05/25/11 11:20 AM      Component Value Range Status Comment   Specimen Description CSF   Final    Special Requests     Final    Value: ADDED ON 05/26/2011 AT 1154 PER REYNOLDS, LESLIE MD ON 3100 AT 1012 TUBE 2 1.2CC   Gram  Stain     Final    Value: CYTOSPIN SLIDE     WBC PRESENT, PREDOMINANTLY MONONUCLEAR     NO ORGANISMS SEEN   Report Status 05/26/2011 FINAL   Final    Assessment: Assessment: Confusion, nausea and headache on admit.  Anticoagulation: Coumadin for afib -d/c'd 3/23 for LP (s/p Vitamin K 5mg  IV 3/24). Coumadin resumed yesterday.  INR 2.45.  Will give 2.5mg  today Infectious Disease: +HSV encephalitis on Acyclovir #7 (started 3/24)= 640mg /8h.  (LP=elevated protein and WBC with lymphocyte predominance). Ecoli UTI treated with Rocephin. WBC down 10. Afebrile.  3/24 Acyclovir (RX)>> 3/22 CTX>>> 3/25  Neurology: On Dilantin 150mg  tid.  Check another level next Wednesday (after 1 week).  Nephrology: SrCr 1.79 UOP.  CrCl about 39mL/min  Pulmonary: intubated 3/24 s/p seizures, delirium w/ benzos. Extubated 3/27.   Hematology / Oncology: CBC OK  Plan: 1. Coumadin 2.5mg  po x 1 tonight.  Goal INR 2-3 2. Decrease Acyclovir 10mg /kg to q12 through 4/7 per ID note (14d) 3. Dilantin dose is 150mg  tid. Level about next Wed if therapy to continue.    [name] [name] 05/30/2011,1:52 PM

## 2011-05-30 NOTE — Progress Notes (Signed)
PATIENT DETAILS Name: [name] [name] Age: 71 y.o. Sex: female Date of Birth: 06-23-1940 Admit Date: 05/22/2011 [id number] Larose Kells, MD, MD  Subjective: Seems much more awake today, less confused. Was able to answer all my questions appropriately.  Objective: Vital signs in last 24 hours: Filed Vitals:   05/29/11 2025 05/30/11 0443 05/30/11 1400 05/30/11 1500  BP: 169/75 178/75 193/101 180/78  Pulse: 72 63 69   Temp: 98.1 F (36.7 C) 97.5 F (36.4 C) 97.5 F (36.4 C)   TempSrc: Oral Oral Oral   Resp: 16 17 18    Height:      Weight:      SpO2: 96% 98% 98%     Weight change:   Body mass index is 31.61 kg/(m^2).  Intake/Output from previous day:  Intake/Output Summary (Last 24 hours) at 05/30/11 1551 Last data filed at 05/30/11 1500  Gross per 24 hour  Intake   1120 ml  Output   2550 ml  Net  -1430 ml    PHYSICAL EXAM: Gen Exam: Awake and alert with clear speech.   Neck: Supple, No JVD.   Chest: B/L Clear.   CVS: S1 S2 Regular, no murmurs.  Abdomen: soft, BS +, non tender, non distended.  Extremities: no edema, lower extremities warm to touch. Neurologic: Non Focal.   Skin: No Rash.   Wounds: N/A.    CONSULTS:  ID  LAB RESULTS: CBC  Lab 05/28/11 0410 05/26/11 0451 05/25/11 0436 05/24/11 0351  WBC 10.0 10.3 10.6* 12.9*  HGB 12.0 11.8* 11.8* 12.4  HCT 37.1 35.3* 35.9* 37.9  PLT 269 233 260 308  MCV 89.8 87.6 87.8 87.1  MCH 29.1 29.3 28.9 28.5  MCHC 32.3 33.4 32.9 32.7  RDW 15.2 14.8 14.6 14.4  LYMPHSABS -- -- -- --  MONOABS -- -- -- --  EOSABS -- -- -- --  BASOSABS -- -- -- --  BANDABS -- -- -- --    Chemistries   Lab 05/30/11 0630 05/29/11 0940 05/28/11 0410 05/27/11 0428 05/26/11 0451  NA 136 136 141 141 139  K 3.8 4.4 3.3* 3.6 3.4*  CL 100 101 101 106 103  CO2 28 27 30 28 29   GLUCOSE 98 174* 120* 201* 121*  BUN 16 15 10 16 17   CREATININE 1.79* 1.40* 0.81 1.05 0.95  CALCIUM 8.9 8.7 8.6 8.6 8.5  MG -- -- -- -- --    GFR Estimated  Creatinine Clearance: 35.6 ml/min (by C-G formula based on Cr of 1.79).  Coagulation profile  Lab 05/30/11 0630 05/26/11 0451 05/25/11 0436 05/24/11 1120 05/24/11 0351  INR 2.45* 1.63* 1.38 1.33 2.02*  PROTIME -- -- -- -- --    Cardiac Enzymes No results found for this basename: CK:3,CKMB:3,TROPONINI:3,MYOGLOBIN:3 in the last 168 hours  No components found with this basename: POCBNP:3 No results found for this basename: DDIMER:2 in the last 72 hours No results found for this basename: HGBA1C:2 in the last 72 hours No results found for this basename: CHOL:2,HDL:2,LDLCALC:2,TRIG:2,CHOLHDL:2,LDLDIRECT:2 in the last 72 hours No results found for this basename: TSH,T4TOTAL,FREET3,T3FREE,THYROIDAB in the last 72 hours No results found for this basename: VITAMINB12:2,FOLATE:2,FERRITIN:2,TIBC:2,IRON:2,RETICCTPCT:2 in the last 72 hours No results found for this basename: LIPASE:2,AMYLASE:2 in the last 72 hours  Urine Studies No results found for this basename: UACOL:2,UAPR:2,USPG:2,UPH:2,UTP:2,UGL:2,UKET:2,UBIL:2,UHGB:2,UNIT:2,UROB:2,ULEU:2,UEPI:2,UWBC:2,URBC:2,UBAC:2,CAST:2,CRYS:2,UCOM:2,BILUA:2 in the last 72 hours  MICROBIOLOGY: Recent Results (from the past 240 hour(s))  URINE CULTURE     Status: Normal   Collection Time   05/22/11 10:56 AM  Component Value Range Status Comment   Specimen Description URINE, CATHETERIZED   Final    Special Requests NONE   Final    Culture  Setup Time [id number]   Final    Colony Count 45,000 COLONIES/ML   Final    Culture ESCHERICHIA COLI   Final    Report Status 05/24/2011 FINAL   Final    Organism ID, Bacteria ESCHERICHIA COLI   Final   MRSA PCR SCREENING     Status: Normal   Collection Time   05/24/11 12:20 AM      Component Value Range Status Comment   MRSA by PCR NEGATIVE  NEGATIVE  Final   CSF CULTURE     Status: Normal   Collection Time   05/25/11 11:20 AM      Component Value Range Status Comment   Specimen Description CSF   Final      Special Requests     Final    Value: ADDED ON 05/26/2011 AT 1154 PER REYNOLDS,LESLIE MD ON 3100 AT 1012 TUBE 2 1.2CC   Gram Stain     Final    Value: CYTOSPIN WBC PRESENT, PREDOMINANTLY MONONUCLEAR     NO ORGANISMS SEEN     Performed at [facility]   Culture NO GROWTH 3 DAYS   Final    Report Status 05/30/2011 FINAL   Final   GRAM STAIN     Status: Normal   Collection Time   05/25/11 11:20 AM      Component Value Range Status Comment   Specimen Description CSF   Final    Special Requests     Final    Value: ADDED ON 05/26/2011 AT 1154 PER REYNOLDS, LESLIE MD ON 3100 AT 1012 TUBE 2 1.2CC   Gram Stain     Final    Value: CYTOSPIN SLIDE     WBC PRESENT, PREDOMINANTLY MONONUCLEAR     NO ORGANISMS SEEN   Report Status 05/26/2011 FINAL   Final     RADIOLOGY STUDIES/RESULTS: Dg Chest 2 View  05/16/2011  *RADIOLOGY REPORT*  Clinical Data: Headache and weakness.  Vomiting and fever.  CHEST - 2 VIEW  Comparison: Chest x-ray 12/01/2006.  Findings: The heart is enlarged.  Mild bibasilar airspace disease likely reflects atelectasis.  There is mild pulmonary vascular congestion.  Postsurgical changes are noted in the right chest.  A previous thoracotomy has healed.  IMPRESSION:  1.  Cardiomegaly with mild pulmonary vascular congestion. 2.  Mild bibasilar atelectasis. 3.  Postoperative changes of the right chest.  Original Report Authenticated By: Resa Miner. MATTERN, M.D.   Ct Head Wo Contrast  05/24/2011  *RADIOLOGY REPORT*  Clinical Data: New seizure activity  CT HEAD WITHOUT CONTRAST  Technique:  Contiguous axial images were obtained from the base of the skull through the vertex without contrast.  Comparison: 05/16/2011 Correlation:  MRI brain 05/17/2011  Findings: Normal ventricular morphology. No midline shift or mass effect. Minimal small vessel chronic ischemic changes of deep cerebral white matter. Large area of hypoattenuation identified medial aspect of the left temporal lobe  increased in size since previous exam. Remaining brain parenchyma normal appearance. No intracranial hemorrhage or additional infarction identified. No definite extra-axial fluid collection. Visualized paranasal sinuses and mastoid air cells clear. Bones demineralized.  IMPRESSION: Minimal small vessel chronic ischemic changes of deep cerebral white matter. Enlarging area of low attenuation identified at medial aspect of the left temporal lobe; this corresponds to an area of abnormal signal on prior  MR, which was suspicious for encephalitis, possibly related to herpes. Findings are progressive since previous study.  Original Report Authenticated By: Burnetta Sabin, M.D.   Ct Head Wo Contrast  05/16/2011  *RADIOLOGY REPORT*  Clinical Data: Nausea, vomiting  CT HEAD WITHOUT CONTRAST  Technique:  Contiguous axial images were obtained from the base of the skull through the vertex without contrast.  Comparison: CT 05/05/2010  Findings: No acute intracranial hemorrhage.  No focal mass lesion. There is a new cortical hypodensity within the inferior medial left temporal lobe (image 10).  No additional evidence infarction.  No evidence of intracranial hemorrhage.  Paranasal sinuses and mastoid air cells are clear.  Orbits are normal.  IMPRESSION:  New cortical hypodensity within the inferior medial temporal lobe of unclear etiology.  Recommend MRI if the patient has symptoms of acute infarction.  Original Report Authenticated By: Suzy Bouchard, M.D.   Mr Brain Wo Contrast  05/16/2011  *RADIOLOGY REPORT*  Clinical Data: Nausea and headache.  Altered mental status.  Fever.  MRI HEAD WITHOUT CONTRAST  Technique:  Multiplanar, multiecho pulse sequences of the brain and surrounding structures were obtained according to standard protocol without intravenous contrast.  Comparison: CT head without contrast 05/16/2011.  Findings: A focal area of T2 and FLAIR hyperintensity is present within the posterior medial left temporal  lobe, along the body and tail of the hippocampus.  There is slight T2 hyperintensity along the undersurface of the right temporal lobe.  No hemorrhage or mass lesion is present.  There is no restricted diffusion associated with these areas.  Other scattered periventricular and subcortical T2 and FLAIR hyperintensities are present in the frontal parietal lobes bilaterally.  Flow is present in the major intracranial arteries.  The patient is status post bilateral lens extractions.  The globes and orbits are otherwise intact.  The paranasal sinuses and mastoid air cells are clear.  IMPRESSION:  1.  Abnormal signal within the posterior left hippocampus and undersurface of the right temporal lobe.  This raises the possibility of herpes encephalitis.  The differential diagnosis includes a low grade glioma on the left.  A para neoplastic syndrome could have this appearance.  The left-sided appearance could be seen in the setting of recent or ongoing seizure activity. 2.  Mild small vessel disease.  These results were called by telephone on 05/16/2011  at  05:40 p.m. to  Dr. Lauris Poag, who verbally acknowledged these results.  Original Report Authenticated By: Resa Miner. MATTERN, M.D.   Mr Jeri Cos Contrast  05/18/2011  *RADIOLOGY REPORT*  Clinical Data: 71 year old female with nausea, headache, fever and altered mental status.  Abnormal brain MRI without contrast.  MRI HEAD WITH CONTRAST  Technique:  Multiplanar, multiecho pulse sequences of the brain and surrounding structures were obtained according to standard protocol with intravenous contrast  Contrast: 30mL MULTIHANCE GADOBENATE DIMEGLUMINE 529 MG/ML IV SOLN  Comparison: Brain MRI without contrast 05/16/2011.  Findings: Postcontrast images were obtained to further evaluate the abnormal mesial temporal lobes.  There is asymmetric abnormal enhancement of the left hippocampal complex and para hippocampal gyrus (coronal images 11 - 14).  The configuration of  enhancement favors a leptomeningeal pattern.   No mass like enhancement on the left.  On the right there is no definite abnormal enhancement. There is subtle decreased T1 signal corresponding to the T2 signal abnormality on the earlier exam.  The superior ophthalmic veins are dilated, but the cavernous sinus appears remain patent.  No other abnormal enhancement.  Basilar  cisterns remain patent.  Negative visualized cervical spine. Visualized bone marrow signal is within normal limits.  No ventriculomegaly, midline shift or mass effect.  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  Decreased bitemporal T1 signal corresponding to the abnormality on the earlier noncontrast study.  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 CSF analysis is pending.  A preliminary report without discrepancy to the above was issued by Dr. Dixon Boos at 2310 hours on 05/17/2011.  Original Report Authenticated By: Randall An, M.D.   Dg Chest Port 1 View  05/26/2011  *RADIOLOGY REPORT*  Clinical Data: Follow up respiratory failure  PORTABLE CHEST - 1 VIEW  Comparison: 05/25/11  Findings:  The endotracheal tube tip is stable above the carina.  There is a nasogastric tube looped in the stomach.  There is asymmetric elevation of the right hemidiaphragm.  Atelectasis is noted within both lung bases, right greater than left.  IMPRESSION:  1.  Continued bibasilar atelectasis with asymmetric elevation of the right hemidiaphragm.  Original Report Authenticated By: Angelita Ingles, M.D.   Dg Chest Port 1 View  05/25/2011  *RADIOLOGY REPORT*  Clinical Data: Endotracheal tube evaluation.  PORTABLE CHEST - 1 VIEW  Comparison: 05/24/2011.  Findings: Endotracheal tube is in satisfactory position. Nasogastric tube is followed into the stomach.  Heart size stable. Mild bibasilar air space disease.  Lungs are low in volume.  No definite pleural fluid.  Old bilateral rib  fractures.  IMPRESSION: Low lung volumes with mild bibasilar air space disease.  Original Report Authenticated By: Luretha Rued, M.D.   Dg Chest Port 1 View  05/24/2011  *RADIOLOGY REPORT*  Clinical Data: Intubation  PORTABLE CHEST - 1 VIEW  Comparison: Portable exam 0114 hours compared to 05/16/2011  Findings: Tip of endotracheal tube 4.1 cm above carina. Enlargement of cardiac silhouette. Mediastinal contours and pulmonary vascularity normal. Atherosclerotic calcification aorta. Bibasilar atelectasis. Lungs otherwise clear. Bones unremarkable. No pneumothorax.  IMPRESSION: Enlargement of cardiac silhouette. Bibasilar atelectasis. Satisfactory endotracheal tube position.  Original Report Authenticated By: Burnetta Sabin, M.D.   Dg Fluoro Guide Ndl Plc/bx  05/25/2011  *RADIOLOGY REPORT*  Clinical Data:  Encephalitis  DIAGNOSTIC LUMBAR PUNCTURE UNDER FLUOROSCOPIC GUIDANCE  Fluoroscopy time:  1.3 minutes.  Technique:  Informed consent was obtained from the patient prior to the procedure, including potential complications of headache, allergy, and pain.   With the patient prone, the lower back was prepped with Betadine.  1% Lidocaine was used for local anesthesia. Lumbar puncture was performed at the rate L3-L4 level using a 20 gauge needle with return of clear yellow tinged  CSF with an opening pressure of 15 cm water.   12 ml of CSF were obtained for laboratory studies.  The patient tolerated the procedure well and there were no apparent complications. The patient is intubated during the procedure.  IMPRESSION: Successful lumbar puncture.  Clear yellow tinged CSF.  Original Report Authenticated By: Suzy Bouchard, M.D.    MEDICATIONS: Scheduled Meds:   . acyclovir  10 mg/kg (Ideal) Intravenous Q12H  . aspirin  81 mg Per Tube Daily  . diltiazem  30 mg Oral Q6H  . enoxaparin (LOVENOX) injection  40 mg Subcutaneous Q24H  . fluconazole  100 mg Oral Daily  . insulin aspart  0-20 Units Subcutaneous  TID WC  . insulin aspart  0-5 Units Subcutaneous QHS  . insulin aspart  4 Units Subcutaneous TID WC  . insulin glargine  30 Units Subcutaneous BID  . metoprolol tartrate  12.5 mg Oral BID  . phenytoin  150 mg Oral TID  . warfarin  2.5 mg Oral ONCE-1800  . warfarin  5 mg Oral ONCE-1800  . Warfarin - Pharmacist Dosing Inpatient   Does not apply q1800  . DISCONTD: acyclovir  10 mg/kg (Ideal) Intravenous Q8H   Continuous Infusions:   . sodium chloride 40 mL/hr at 05/30/11 0631   PRN Meds:.acetaminophen, fentaNYL  Antibiotics: Anti-infectives     Start     Dose/Rate Route Frequency Ordered Stop   05/30/11 1800   acyclovir (ZOVIRAX) 640 mg in dextrose 5 % 100 mL IVPB        10 mg/kg  63.9 kg (Ideal) 112.8 mL/hr over 60 Minutes Intravenous Every 12 hours 05/30/11 1352     05/29/11 1230   fluconazole (DIFLUCAN) tablet 100 mg        100 mg Oral Daily 05/29/11 1147     05/25/11 1400   acyclovir (ZOVIRAX) 640 mg in dextrose 5 % 100 mL IVPB  Status:  Discontinued        10 mg/kg  63.9 kg (Ideal) 112.8 mL/hr over 60 Minutes Intravenous 3 times per day 05/25/11 1035 05/30/11 1352   05/24/11 0030   acyclovir (ZOVIRAX) 500 mg in dextrose 5 % 100 mL IVPB  Status:  Discontinued        10 mg/kg  50.1 kg (Ideal) 110 mL/hr over 60 Minutes Intravenous 3 times per day 05/23/11 2345 05/25/11 1035   05/23/11 1130   cefTRIAXone (ROCEPHIN) 1 g in dextrose 5 % 50 mL IVPB  Status:  Discontinued        1 g 100 mL/hr over 30 Minutes Intravenous Every 24 hours 05/23/11 1041 05/25/11 1906   05/22/11 1315   cefTRIAXone (ROCEPHIN) 1 g in dextrose 5 % 50 mL IVPB        1 g 100 mL/hr over 30 Minutes Intravenous  Once 05/22/11 1303 05/22/11 1346          Assessment/Plan: Patient Active Hospital Problem List:  Toxic metabolic encephalopathy - patient is awake and gives short appropriate answers. At times she is confused but it appears the encephalopathy is slowly resolving.   HSV encephalitis  -  PCR positive. Neuro and ID following  -Cont IV Acyclovir - discussed with Dr Linus Salmons. Needs to remain on IV and complete 14 days. Might need to consider PICC to complete therapy as outpt   Acute Respiratory failure  VDRF -extubated on 3/27 (intubated 3/24) resolved  -due to neurological process:   UTI: 3/18 pan sensitive E.coli  -Cftrx D/C'd   PAF (very brief)  -Converted 3/25 AM to NSR, sinus brady with prolonged QT  -amiodarone gtt started 3/24 >. D/C'd 3/25  -No eval indicated @ this time  -On home dose of diltiazem, and  metoprolol.  -Coumadin per pharmacy -INR currently therapeutic  -AKI  -monitor creatinine-slight increase today as well, I will increase IV fluids to 100 cc/hour-may need to adjust dosing of acyclovir -Recheck electrolytes in the morning  Htn  - Restart metoprolol at home dose. Cont PRN hydralazine   DM2 -  -CBGs controlled  -Continue with 30 units of Lantus and 4 units of NovoLog with meals, also on SSI. We'll titrate dosing as needed.   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  -continue with dilantin   Vaginal Yeast Infection  - diflucan started 3/29.   DVT prophylaxis: -Not needed as  on Coumadin   Dispo: Will ask [facility] to evaluate for LTACH placement. Whether Ltach or SNF d/c is likely on Monday.   Code Status: Full code  Jonetta Osgood,  MD. 05/30/2011, 3:51 PM

## 2011-05-30 NOTE — Progress Notes (Signed)
CSW provided bed offers.  Pt husband declined to make choice until final decision about LTAC is made. Amedeo Gory MSW,LCSW w/e Coverage [phone]

## 2011-05-31 ENCOUNTER — Inpatient Hospital Stay (HOSPITAL_COMMUNITY): Payer: Medicare Other

## 2011-05-31 LAB — CBC
HCT: 37.2 % (ref 36.0–46.0)
Hemoglobin: 12.5 g/dL (ref 12.0–15.0)
MCHC: 33.6 g/dL (ref 30.0–36.0)
RBC: 4.25 MIL/uL (ref 3.87–5.11)
WBC: 9.4 10*3/uL (ref 4.0–10.5)

## 2011-05-31 LAB — BASIC METABOLIC PANEL
BUN: 16 mg/dL (ref 6–23)
CO2: 25 mEq/L (ref 19–32)
Chloride: 100 mEq/L (ref 96–112)
Glucose, Bld: 83 mg/dL (ref 70–99)
Potassium: 3.3 mEq/L — ABNORMAL LOW (ref 3.5–5.1)
Sodium: 137 mEq/L (ref 135–145)

## 2011-05-31 LAB — GLUCOSE, CAPILLARY
Glucose-Capillary: 102 mg/dL — ABNORMAL HIGH (ref 70–99)
Glucose-Capillary: 85 mg/dL (ref 70–99)
Glucose-Capillary: 75 mg/dL (ref 70–99)

## 2011-05-31 LAB — PROTIME-INR: INR: 3.84 — ABNORMAL HIGH (ref 0.00–1.49)

## 2011-05-31 MED ORDER — HYDRALAZINE HCL 20 MG/ML IJ SOLN
5.0000 mg | Freq: Four times a day (QID) | INTRAMUSCULAR | Status: DC | PRN
  Administered 2011-05-31: 5 mg via INTRAVENOUS
  Filled 2011-05-31: qty 0.25

## 2011-05-31 MED ORDER — DILTIAZEM HCL ER COATED BEADS 240 MG PO CP24
240.0000 mg | ORAL_CAPSULE | Freq: Every day | ORAL | Status: DC
  Administered 2011-05-31 – 2011-06-02 (×3): 240 mg via ORAL
  Filled 2011-05-31 (×4): qty 1

## 2011-05-31 MED ORDER — METOPROLOL TARTRATE 25 MG PO TABS
25.0000 mg | ORAL_TABLET | Freq: Two times a day (BID) | ORAL | Status: DC
  Administered 2011-05-31 – 2011-06-01 (×3): 25 mg via ORAL
  Filled 2011-05-31 (×4): qty 1

## 2011-05-31 MED ORDER — POTASSIUM CHLORIDE CRYS ER 20 MEQ PO TBCR
40.0000 meq | EXTENDED_RELEASE_TABLET | Freq: Once | ORAL | Status: AC
  Administered 2011-05-31: 40 meq via ORAL
  Filled 2011-05-31: qty 2

## 2011-05-31 MED ORDER — WHITE PETROLATUM GEL
Status: AC
  Administered 2011-05-31: 12:00:00
  Filled 2011-05-31: qty 5

## 2011-05-31 MED ORDER — HYDRALAZINE HCL 20 MG/ML IJ SOLN
10.0000 mg | Freq: Four times a day (QID) | INTRAMUSCULAR | Status: DC | PRN
Start: 2011-05-31 — End: 2011-06-02
  Filled 2011-05-31: qty 0.5

## 2011-05-31 NOTE — Progress Notes (Signed)
MEDICATION RELATED CONSULT NOTE - FOLLOW UP   Pharmacy Consult for Acyclovir, Phenytoin, Coumadin Indication: Herpes encephalitis, seizures, h/o a fib  Allergies  Allergen Reactions  . Procaine Hcl Anaphylaxis    Patient Measurements: Height: 5\' 8"  (172.7 cm) Weight: 207 lb 14.3 oz (94.3 kg) IBW/kg (Calculated) : 63.9   Vital Signs: Temp: 97.3 F (36.3 C) (03/31 0427) Temp src: Oral (03/31 0427) BP: 176/82 mmHg (03/31 0427) Pulse Rate: 60  (03/31 0427) Intake/Output from previous day: 03/30 0701 - 03/31 0700 In: 2133 [P.O.:480; I.V.:1653] Out: 2150 [Urine:2150] Intake/Output from this shift:    Labs:  Basename 05/31/11 0818 05/30/11 0630 05/29/11 0940  WBC 9.4 -- --  HGB 12.5 -- --  HCT 37.2 -- --  PLT 390 -- --  APTT -- -- --  CREATININE 1.38* 1.79* 1.40*  LABCREA -- -- --  CREATININE 1.38* 1.79* 1.40*  CREAT24HRUR -- -- --  MG -- -- --  PHOS -- -- --  ALBUMIN -- -- --  PROT -- -- --  ALBUMIN -- -- --  AST -- -- --  ALT -- -- --  ALKPHOS -- -- --  BILITOT -- -- --  BILIDIR -- -- --  IBILI -- -- --   Estimated Creatinine Clearance: 46.2 ml/min (by C-G formula based on Cr of 1.38).   Microbiology: Recent Results (from the past 720 hour(s))  CULTURE, BLOOD (ROUTINE X 2)     Status: Normal   Collection Time   05/17/11  7:33 PM      Component Value Range Status Comment   Specimen Description BLOOD LEFT ARM   Final    Special Requests     Final    Value: BOTTLES DRAWN AEROBIC AND ANAEROBIC 10CC AEROBIC 8CC ANAEROBIC   Culture  Setup Time [id number]   Final    Culture NO GROWTH 5 DAYS   Final    Report Status 05/24/2011 FINAL   Final   CULTURE, BLOOD (ROUTINE X 2)     Status: Normal   Collection Time   05/17/11  7:38 PM      Component Value Range Status Comment   Specimen Description BLOOD LEFT ARM   Final    Special Requests     Final    Value: BOTTLES DRAWN AEROBIC AND ANAEROBIC 10CC AEROBIC 8CC ANAEROBIC   Culture  Setup Time [id number]   Final     Culture NO GROWTH 5 DAYS   Final    Report Status 05/24/2011 FINAL   Final   URINE CULTURE     Status: Normal   Collection Time   05/18/11  3:02 AM      Component Value Range Status Comment   Specimen Description URINE, RANDOM   Final    Special Requests NONE   Final    Culture  Setup Time [id number]   Final    Colony Count >=100,000 COLONIES/ML   Final    Culture ESCHERICHIA COLI   Final    Report Status 05/20/2011 FINAL   Final    Organism ID, Bacteria ESCHERICHIA COLI   Final   URINE CULTURE     Status: Normal   Collection Time   05/22/11 10:56 AM      Component Value Range Status Comment   Specimen Description URINE, CATHETERIZED   Final    Special Requests NONE   Final    Culture  Setup Time [id number]   Final    Colony Count 45,000 COLONIES/ML   Final  Culture ESCHERICHIA COLI   Final    Report Status 05/24/2011 FINAL   Final    Organism ID, Bacteria ESCHERICHIA COLI   Final   MRSA PCR SCREENING     Status: Normal   Collection Time   05/24/11 12:20 AM      Component Value Range Status Comment   MRSA by PCR NEGATIVE  NEGATIVE  Final   CSF CULTURE     Status: Normal   Collection Time   05/25/11 11:20 AM      Component Value Range Status Comment   Specimen Description CSF   Final    Special Requests     Final    Value: ADDED ON 05/26/2011 AT 1154 PER REYNOLDS,LESLIE MD ON 3100 AT 1012 TUBE 2 1.2CC   Gram Stain     Final    Value: CYTOSPIN WBC PRESENT, PREDOMINANTLY MONONUCLEAR     NO ORGANISMS SEEN     Performed at [facility]   Culture NO GROWTH 3 DAYS   Final    Report Status 05/30/2011 FINAL   Final   GRAM STAIN     Status: Normal   Collection Time   05/25/11 11:20 AM      Component Value Range Status Comment   Specimen Description CSF   Final    Special Requests     Final    Value: ADDED ON 05/26/2011 AT 1154 PER REYNOLDS, LESLIE MD ON 3100 AT 1012 TUBE 2 1.2CC   Gram Stain     Final    Value: CYTOSPIN SLIDE     WBC PRESENT, PREDOMINANTLY  MONONUCLEAR     NO ORGANISMS SEEN   Report Status 05/26/2011 FINAL   Final    Assessment: Assessment: Confusion, nausea and headache on admit.  Anticoagulation: Coumadin for afib -d/c'd 3/23 for LP (s/p Vitamin K 5mg  IV 3/24).  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  (LP=elevated protein and WBC with lymphocyte predominance). Ecoli UTI treated with Rocephin. WBC down 9.4. Afebrile.  3/24 Acyclovir (RX)>> 3/22 CTX>>> 3/25  Neurology: On Dilantin 150mg  tid.  Check another level next Wednesday (after 1 week).  Nephrology: SrCr 1.38 UOP.  CrCl about 37mL/min  Pulmonary: intubated 3/24 s/p seizures, delirium w/ benzos. Extubated 3/27.   Hematology / Oncology: CBC OK  Plan: 1. No Coumadin tonight. Check AM INR.  Goal INR 2-3 2. Continue Acyclovir 10mg /kg to q12 through 4/7 per ID note (14d) 3. Dilantin dose is 150mg  tid. Level about next Wed if therapy to continue.    Candie Mile 05/31/2011,12:25 PM

## 2011-05-31 NOTE — Progress Notes (Signed)
PATIENT DETAILS Name: [name] [name] Age: 71 y.o. Sex: female Date of Birth: [birth year] Admit Date: 05/22/2011 [id number] [name], MD, MD  Subjective: Mental status seems to be improving slowly-just not hungry today  Objective: Vital signs in last 24 hours: Filed Vitals:   05/30/11 2208 05/30/11 2209 05/30/11 2355 05/31/11 0427  BP: 170/69 170/69 149/74 176/82  Pulse: 71 93  60  Temp:  97.9 F (36.6 C)  97.3 F (36.3 C)  TempSrc:  Oral  Oral  Resp:  20  20  Height:      Weight:      SpO2:  96%  95%    Weight change:   Body mass index is 31.61 kg/(m^2).  Intake/Output from previous day:  Intake/Output Summary (Last 24 hours) at 05/31/11 1032 Last data filed at 05/31/11 0500  Gross per 24 hour  Intake   1893 ml  Output   1150 ml  Net    743 ml    PHYSICAL EXAM: Gen Exam: Awake and alert with clear speech.   Neck: Supple, No JVD.   Chest: B/L Clear.   CVS: S1 S2 Regular, no murmurs.  Abdomen: soft, BS +, non tender, non distended.  Extremities: no edema, lower extremities warm to touch. Neurologic: Non Focal.   Skin: No Rash.   Wounds: N/A.    CONSULTS:  ID  LAB RESULTS: CBC  Lab 05/31/11 0818 05/28/11 0410 05/26/11 0451 05/25/11 0436  WBC 9.4 10.0 10.3 10.6*  HGB 12.5 12.0 11.8* 11.8*  HCT 37.2 37.1 35.3* 35.9*  PLT 390 269 233 260  MCV 87.5 89.8 87.6 87.8  MCH 29.4 29.1 29.3 28.9  MCHC 33.6 32.3 33.4 32.9  RDW 14.6 15.2 14.8 14.6  LYMPHSABS -- -- -- --  MONOABS -- -- -- --  EOSABS -- -- -- --  BASOSABS -- -- -- --  BANDABS -- -- -- --    Chemistries   Lab 05/31/11 0818 05/30/11 0630 05/29/11 0940 05/28/11 0410 05/27/11 0428  NA 137 136 136 141 141  K 3.3* 3.8 4.4 3.3* 3.6  CL 100 100 101 101 106  CO2 25 28 27 30 28   GLUCOSE 83 98 174* 120* 201*  BUN 16 16 15 10 16   CREATININE 1.38* 1.79* 1.40* 0.81 1.05  CALCIUM 9.0 8.9 8.7 8.6 8.6  MG -- -- -- -- --    GFR Estimated Creatinine Clearance: 46.2 ml/min (by C-G formula based on Cr of  1.38).  Coagulation profile  Lab 05/31/11 0818 05/30/11 0630 05/26/11 0451 05/25/11 0436 05/24/11 1120  INR 3.84* 2.45* 1.63* 1.38 1.33  PROTIME -- -- -- -- --    Cardiac Enzymes No results found for this basename: CK:3,CKMB:3,TROPONINI:3,MYOGLOBIN:3 in the last 168 hours  No components found with this basename: POCBNP:3 No results found for this basename: DDIMER:2 in the last 72 hours No results found for this basename: HGBA1C:2 in the last 72 hours No results found for this basename: CHOL:2,HDL:2,LDLCALC:2,TRIG:2,CHOLHDL:2,LDLDIRECT:2 in the last 72 hours No results found for this basename: TSH,T4TOTAL,FREET3,T3FREE,THYROIDAB in the last 72 hours No results found for this basename: VITAMINB12:2,FOLATE:2,FERRITIN:2,TIBC:2,IRON:2,RETICCTPCT:2 in the last 72 hours No results found for this basename: LIPASE:2,AMYLASE:2 in the last 72 hours  Urine Studies No results found for this basename: UACOL:2,UAPR:2,USPG:2,UPH:2,UTP:2,UGL:2,UKET:2,UBIL:2,UHGB:2,UNIT:2,UROB:2,ULEU:2,UEPI:2,UWBC:2,URBC:2,UBAC:2,CAST:2,CRYS:2,UCOM:2,BILUA:2 in the last 72 hours  MICROBIOLOGY: Recent Results (from the past 240 hour(s))  URINE CULTURE     Status: Normal   Collection Time   05/22/11 10:56 AM      Component Value Range Status  Comment   Specimen Description URINE, CATHETERIZED   Final    Special Requests NONE   Final    Culture  Setup Time [id number]   Final    Colony Count 45,000 COLONIES/ML   Final    Culture ESCHERICHIA COLI   Final    Report Status 05/24/2011 FINAL   Final    Organism ID, Bacteria ESCHERICHIA COLI   Final   MRSA PCR SCREENING     Status: Normal   Collection Time   05/24/11 12:20 AM      Component Value Range Status Comment   MRSA by PCR NEGATIVE  NEGATIVE  Final   CSF CULTURE     Status: Normal   Collection Time   05/25/11 11:20 AM      Component Value Range Status Comment   Specimen Description CSF   Final    Special Requests     Final    Value: ADDED ON 05/26/2011 AT  1154 PER REYNOLDS,LESLIE MD ON 3100 AT 1012 TUBE 2 1.2CC   Gram Stain     Final    Value: CYTOSPIN WBC PRESENT, PREDOMINANTLY MONONUCLEAR     NO ORGANISMS SEEN     Performed at [facility]   Culture NO GROWTH 3 DAYS   Final    Report Status 05/30/2011 FINAL   Final   GRAM STAIN     Status: Normal   Collection Time   05/25/11 11:20 AM      Component Value Range Status Comment   Specimen Description CSF   Final    Special Requests     Final    Value: ADDED ON 05/26/2011 AT 1154 PER REYNOLDS, LESLIE MD ON 3100 AT 1012 TUBE 2 1.2CC   Gram Stain     Final    Value: CYTOSPIN SLIDE     WBC PRESENT, PREDOMINANTLY MONONUCLEAR     NO ORGANISMS SEEN   Report Status 05/26/2011 FINAL   Final     RADIOLOGY STUDIES/RESULTS: Dg Chest 2 View  05/16/2011  *RADIOLOGY REPORT*  Clinical Data: Headache and weakness.  Vomiting and fever.  CHEST - 2 VIEW  Comparison: Chest x-ray 12/01/2006.  Findings: The heart is enlarged.  Mild bibasilar airspace disease likely reflects atelectasis.  There is mild pulmonary vascular congestion.  Postsurgical changes are noted in the right chest.  A previous thoracotomy has healed.  IMPRESSION:  1.  Cardiomegaly with mild pulmonary vascular congestion. 2.  Mild bibasilar atelectasis. 3.  Postoperative changes of the right chest.  Original Report Authenticated By: Resa Miner. MATTERN, M.D.   Ct Head Wo Contrast  05/24/2011  *RADIOLOGY REPORT*  Clinical Data: New seizure activity  CT HEAD WITHOUT CONTRAST  Technique:  Contiguous axial images were obtained from the base of the skull through the vertex without contrast.  Comparison: 05/16/2011 Correlation:  MRI brain 05/17/2011  Findings: Normal ventricular morphology. No midline shift or mass effect. Minimal small vessel chronic ischemic changes of deep cerebral white matter. Large area of hypoattenuation identified medial aspect of the left temporal lobe increased in size since previous exam. Remaining brain parenchyma  normal appearance. No intracranial hemorrhage or additional infarction identified. No definite extra-axial fluid collection. Visualized paranasal sinuses and mastoid air cells clear. Bones demineralized.  IMPRESSION: Minimal small vessel chronic ischemic changes of deep cerebral white matter. Enlarging area of low attenuation identified at medial aspect of the left temporal lobe; this corresponds to an area of abnormal signal on prior MR, which was suspicious for  encephalitis, possibly related to herpes. Findings are progressive since previous study.  Original Report Authenticated By: Burnetta Sabin, M.D.   Ct Head Wo Contrast  05/16/2011  *RADIOLOGY REPORT*  Clinical Data: Nausea, vomiting  CT HEAD WITHOUT CONTRAST  Technique:  Contiguous axial images were obtained from the base of the skull through the vertex without contrast.  Comparison: CT 05/05/2010  Findings: No acute intracranial hemorrhage.  No focal mass lesion. There is a new cortical hypodensity within the inferior medial left temporal lobe (image 10).  No additional evidence infarction.  No evidence of intracranial hemorrhage.  Paranasal sinuses and mastoid air cells are clear.  Orbits are normal.  IMPRESSION:  New cortical hypodensity within the inferior medial temporal lobe of unclear etiology.  Recommend MRI if the patient has symptoms of acute infarction.  Original Report Authenticated By: Suzy Bouchard, M.D.   Mr Brain Wo Contrast  05/16/2011  *RADIOLOGY REPORT*  Clinical Data: Nausea and headache.  Altered mental status.  Fever.  MRI HEAD WITHOUT CONTRAST  Technique:  Multiplanar, multiecho pulse sequences of the brain and surrounding structures were obtained according to standard protocol without intravenous contrast.  Comparison: CT head without contrast 05/16/2011.  Findings: A focal area of T2 and FLAIR hyperintensity is present within the posterior medial left temporal lobe, along the body and tail of the hippocampus.  There is slight  T2 hyperintensity along the undersurface of the right temporal lobe.  No hemorrhage or mass lesion is present.  There is no restricted diffusion associated with these areas.  Other scattered periventricular and subcortical T2 and FLAIR hyperintensities are present in the frontal parietal lobes bilaterally.  Flow is present in the major intracranial arteries.  The patient is status post bilateral lens extractions.  The globes and orbits are otherwise intact.  The paranasal sinuses and mastoid air cells are clear.  IMPRESSION:  1.  Abnormal signal within the posterior left hippocampus and undersurface of the right temporal lobe.  This raises the possibility of herpes encephalitis.  The differential diagnosis includes a low grade glioma on the left.  A para neoplastic syndrome could have this appearance.  The left-sided appearance could be seen in the setting of recent or ongoing seizure activity. 2.  Mild small vessel disease.  These results were called by telephone on 05/16/2011  at  05:40 p.m. to  Dr. Lauris Poag, who verbally acknowledged these results.  Original Report Authenticated By: Resa Miner. MATTERN, M.D.   Mr Jeri Cos Contrast  05/18/2011  *RADIOLOGY REPORT*  Clinical Data: 71 year old female with nausea, headache, fever and altered mental status.  Abnormal brain MRI without contrast.  MRI HEAD WITH CONTRAST  Technique:  Multiplanar, multiecho pulse sequences of the brain and surrounding structures were obtained according to standard protocol with intravenous contrast  Contrast: 67mL MULTIHANCE GADOBENATE DIMEGLUMINE 529 MG/ML IV SOLN  Comparison: Brain MRI without contrast 05/16/2011.  Findings: Postcontrast images were obtained to further evaluate the abnormal mesial temporal lobes.  There is asymmetric abnormal enhancement of the left hippocampal complex and para hippocampal gyrus (coronal images 11 - 14).  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   No mass like enhancement on  the left.  On the right there is no definite abnormal enhancement. There is subtle decreased T1 signal corresponding to the T2 signal abnormality on the earlier exam.  The superior ophthalmic veins are dilated, but the cavernous sinus appears remain patent.  No other abnormal enhancement.  Basilar cisterns remain patent.  Negative  visualized cervical spine. Visualized bone marrow signal is within normal limits.  No ventriculomegaly, midline shift or mass effect.  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  Decreased bitemporal T1 signal corresponding to the abnormality on the earlier noncontrast study.  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 CSF analysis is pending.  A preliminary report without discrepancy to the above was issued by Dr. Dixon Boos at 2310 hours on 05/17/2011.  Original Report Authenticated By: Randall An, M.D.   Dg Chest Port 1 View  05/26/2011  *RADIOLOGY REPORT*  Clinical Data: Follow up respiratory failure  PORTABLE CHEST - 1 VIEW  Comparison: 05/25/11  Findings:  The endotracheal tube tip is stable above the carina.  There is a nasogastric tube looped in the stomach.  There is asymmetric elevation of the right hemidiaphragm.  Atelectasis is noted within both lung bases, right greater than left.  IMPRESSION:  1.  Continued bibasilar atelectasis with asymmetric elevation of the right hemidiaphragm.  Original Report Authenticated By: Angelita Ingles, M.D.   Dg Chest Port 1 View  05/25/2011  *RADIOLOGY REPORT*  Clinical Data: Endotracheal tube evaluation.  PORTABLE CHEST - 1 VIEW  Comparison: 05/24/2011.  Findings: Endotracheal tube is in satisfactory position. Nasogastric tube is followed into the stomach.  Heart size stable. Mild bibasilar air space disease.  Lungs are low in volume.  No definite pleural fluid.  Old bilateral rib fractures.  IMPRESSION: Low lung volumes with mild bibasilar air space  disease.  Original Report Authenticated By: Luretha Rued, M.D.   Dg Chest Port 1 View  05/24/2011  *RADIOLOGY REPORT*  Clinical Data: Intubation  PORTABLE CHEST - 1 VIEW  Comparison: Portable exam 0114 hours compared to 05/16/2011  Findings: Tip of endotracheal tube 4.1 cm above carina. Enlargement of cardiac silhouette. Mediastinal contours and pulmonary vascularity normal. Atherosclerotic calcification aorta. Bibasilar atelectasis. Lungs otherwise clear. Bones unremarkable. No pneumothorax.  IMPRESSION: Enlargement of cardiac silhouette. Bibasilar atelectasis. Satisfactory endotracheal tube position.  Original Report Authenticated By: Burnetta Sabin, M.D.   Dg Fluoro Guide Ndl Plc/bx  05/25/2011  *RADIOLOGY REPORT*  Clinical Data:  Encephalitis  DIAGNOSTIC LUMBAR PUNCTURE UNDER FLUOROSCOPIC GUIDANCE  Fluoroscopy time:  1.3 minutes.  Technique:  Informed consent was obtained from the patient prior to the procedure, including potential complications of headache, allergy, and pain.   With the patient prone, the lower back was prepped with Betadine.  1% Lidocaine was used for local anesthesia. Lumbar puncture was performed at the rate L3-L4 level using a 20 gauge needle with return of clear yellow tinged  CSF with an opening pressure of 15 cm water.   12 ml of CSF were obtained for laboratory studies.  The patient tolerated the procedure well and there were no apparent complications. The patient is intubated during the procedure.  IMPRESSION: Successful lumbar puncture.  Clear yellow tinged CSF.  Original Report Authenticated By: Suzy Bouchard, M.D.    MEDICATIONS: Scheduled Meds:    . acyclovir  10 mg/kg (Ideal) Intravenous Q12H  . aspirin  81 mg Per Tube Daily  . diltiazem  30 mg Oral Q6H  . enoxaparin (LOVENOX) injection  40 mg Subcutaneous Q24H  . insulin aspart  0-20 Units Subcutaneous TID WC  . insulin aspart  0-5 Units Subcutaneous QHS  . insulin aspart  4 Units Subcutaneous TID WC  .  insulin glargine  30 Units Subcutaneous BID  . metoprolol tartrate  25 mg Oral  BID  . phenytoin  150 mg Oral TID  . potassium chloride  40 mEq Oral Once  . warfarin  2.5 mg Oral ONCE-1800  . Warfarin - Pharmacist Dosing Inpatient   Does not apply q1800  . DISCONTD: acyclovir  10 mg/kg (Ideal) Intravenous Q8H  . DISCONTD: fluconazole  100 mg Oral Daily  . DISCONTD: metoprolol tartrate  12.5 mg Oral BID   Continuous Infusions:    . sodium chloride 100 mL/hr at 05/31/11 0552   PRN Meds:.acetaminophen, fentaNYL, hydrALAZINE  Antibiotics: Anti-infectives     Start     Dose/Rate Route Frequency Ordered Stop   05/30/11 1800   acyclovir (ZOVIRAX) 640 mg in dextrose 5 % 100 mL IVPB        10 mg/kg  63.9 kg (Ideal) 112.8 mL/hr over 60 Minutes Intravenous Every 12 hours 05/30/11 1352     05/29/11 1230   fluconazole (DIFLUCAN) tablet 100 mg  Status:  Discontinued        100 mg Oral Daily 05/29/11 1147 05/30/11 1650   05/25/11 1400   acyclovir (ZOVIRAX) 640 mg in dextrose 5 % 100 mL IVPB  Status:  Discontinued        10 mg/kg  63.9 kg (Ideal) 112.8 mL/hr over 60 Minutes Intravenous 3 times per day 05/25/11 1035 05/30/11 1352   05/24/11 0030   acyclovir (ZOVIRAX) 500 mg in dextrose 5 % 100 mL IVPB  Status:  Discontinued        10 mg/kg  50.1 kg (Ideal) 110 mL/hr over 60 Minutes Intravenous 3 times per day 05/23/11 2345 05/25/11 1035   05/23/11 1130   cefTRIAXone (ROCEPHIN) 1 g in dextrose 5 % 50 mL IVPB  Status:  Discontinued        1 g 100 mL/hr over 30 Minutes Intravenous Every 24 hours 05/23/11 1041 05/25/11 1906   05/22/11 1315   cefTRIAXone (ROCEPHIN) 1 g in dextrose 5 % 50 mL IVPB        1 g 100 mL/hr over 30 Minutes Intravenous  Once 05/22/11 1303 05/22/11 1346          Assessment/Plan: Patient Active Hospital Problem List:  Toxic metabolic encephalopathy  - patient is awake and gives short appropriate answers. Encephalopathy is slowly resolving.   HSV encephalitis   - PCR positive. Neuro and ID following  -Cont IV Acyclovir - discussed with Dr Linus Salmons. Needs to remain on IV and complete 14 days. Might need to consider PICC to complete therapy as outpt   Acute Respiratory failure  VDRF -extubated on 3/27 (intubated 3/24) resolved  -due to neurological process:   UTI: 3/18 pan sensitive E.coli  -Cftrx D/C'd   PAF (very brief)  -Converted 3/25 AM to NSR, sinus brady with prolonged QT  -amiodarone gtt started 3/24 >. D/C'd 3/25  -No eval indicated @ this time  -change cardizem to 240 mg and continue with  Metoprolol at current dose -Coumadin per pharmacy -INR currently supra-therapeutic  -AKI  -creatinine downtrending now, will decrease IVF today -Recheck electrolytes in the morning  Htn  - Restart metoprolol at home dose. Cont PRN hydralazine  -cardizem dose adjusted today-see how she does on this before adding more meds -she takes Avapro at home-will not resume this at this time given AKI  DM2 -  -CBGs controlled  -Continue with 30 units of Lantus and 4 units of NovoLog with meals, also on SSI. We'll titrate dosing as needed.   Seizure:  likely due to L temporal  lobe lesions.  -continue with dilantin   Vaginal Yeast Infection  - diflucan started 3/29.-stopped 3/30  DVT prophylaxis: -Not needed as on Coumadin   Dispo: Will ask [facility] to evaluate for LTACH placement. Whether Ltach or SNF d/c is likely on Monday.   Code Status: Full code  Jonetta Osgood,  MD. 05/31/2011, 10:32 AM

## 2011-06-01 LAB — BASIC METABOLIC PANEL
BUN: 15 mg/dL (ref 6–23)
Calcium: 8.8 mg/dL (ref 8.4–10.5)
Creatinine, Ser: 1.22 mg/dL — ABNORMAL HIGH (ref 0.50–1.10)
GFR calc Af Amer: 51 mL/min — ABNORMAL LOW (ref >90)

## 2011-06-01 LAB — PROTIME-INR
INR: 3.71 — ABNORMAL HIGH (ref 0.00–1.49)
Prothrombin Time: 37.3 seconds — ABNORMAL HIGH (ref 11.6–15.2)

## 2011-06-01 LAB — GLUCOSE, CAPILLARY: Glucose-Capillary: 126 mg/dL — ABNORMAL HIGH (ref 70–99)

## 2011-06-01 MED ORDER — DEXTROSE 5 % IV SOLN
10.0000 mg/kg | Freq: Three times a day (TID) | INTRAVENOUS | Status: DC
  Administered 2011-06-01 – 2011-06-02 (×4): 640 mg via INTRAVENOUS
  Filled 2011-06-01 (×7): qty 12.8

## 2011-06-01 MED ORDER — INSULIN ASPART 100 UNIT/ML ~~LOC~~ SOLN
0.0000 [IU] | Freq: Three times a day (TID) | SUBCUTANEOUS | Status: DC
  Administered 2011-06-01: 3 [IU] via SUBCUTANEOUS

## 2011-06-01 MED ORDER — INSULIN ASPART 100 UNIT/ML ~~LOC~~ SOLN: 4.0000 [IU] | Freq: Three times a day (TID) | SUBCUTANEOUS | Status: DC

## 2011-06-01 MED ORDER — ACETAMINOPHEN 325 MG PO TABS: 650.0000 mg | ORAL_TABLET | Freq: Four times a day (QID) | ORAL | Status: DC | PRN

## 2011-06-01 MED ORDER — DEXTROSE 5 % IV SOLN: 10.0000 mg/kg | INTRAVENOUS | Status: DC

## 2011-06-01 MED ORDER — INSULIN ASPART 100 UNIT/ML ~~LOC~~ SOLN: 0.0000 [IU] | Freq: Every day | SUBCUTANEOUS | Status: DC

## 2011-06-01 MED ORDER — INSULIN ASPART 100 UNIT/ML ~~LOC~~ SOLN
4.0000 [IU] | Freq: Three times a day (TID) | SUBCUTANEOUS | Status: DC
  Administered 2011-06-02 (×2): 4 [IU] via SUBCUTANEOUS

## 2011-06-01 MED ORDER — PHENYTOIN 50 MG PO CHEW: 150.0000 mg | CHEWABLE_TABLET | Freq: Three times a day (TID) | ORAL | Status: DC

## 2011-06-01 MED ORDER — INSULIN ASPART 100 UNIT/ML ~~LOC~~ SOLN: 0.0000 [IU] | Freq: Three times a day (TID) | SUBCUTANEOUS | Status: DC

## 2011-06-01 MED ORDER — METOPROLOL TARTRATE 50 MG PO TABS
50.0000 mg | ORAL_TABLET | Freq: Two times a day (BID) | ORAL | Status: DC
  Administered 2011-06-01 – 2011-06-02 (×2): 50 mg via ORAL
  Filled 2011-06-01 (×3): qty 1

## 2011-06-01 MED ORDER — WARFARIN SODIUM 2.5 MG PO TABS: 2.5000 mg | ORAL_TABLET | Freq: Every day | ORAL | Status: DC

## 2011-06-01 MED ORDER — INSULIN GLARGINE 100 UNIT/ML ~~LOC~~ SOLN: 30.0000 [IU] | Freq: Two times a day (BID) | SUBCUTANEOUS | Status: DC

## 2011-06-01 MED ORDER — DILTIAZEM HCL ER COATED BEADS 240 MG PO CP24: 240.0000 mg | ORAL_CAPSULE | Freq: Every day | ORAL | Status: DC

## 2011-06-01 MED ORDER — SODIUM CHLORIDE 0.9 % IJ SOLN
10.0000 mL | INTRAMUSCULAR | Status: DC | PRN
  Administered 2011-06-02 (×2): 10 mL

## 2011-06-01 MED ORDER — DEXTROSE 5 % IV SOLN: 10.0000 mg/kg | Freq: Three times a day (TID) | INTRAVENOUS | Status: DC

## 2011-06-01 NOTE — Progress Notes (Signed)
Patient ID: [name], female   DOB: Aug 03, 1940, 71 y.o.   MRN: [id number]  PATIENT DETAILS Name: DELLANIRA ZOELLNER Age: 71 y.o. Sex: female Date of Birth: April 24, 1940 Admit Date: 05/22/2011 [id number] Larose Kells, MD, MD  Subjective: No complaints.  Concerned that she was unable to get out of bed with physical therapy.  Have trouble finding the correct words.  Objective: Vital signs in last 24 hours: Filed Vitals:   05/31/11 1300 05/31/11 2110 06/01/11 0500 06/01/11 1027  BP: 179/85 171/80 131/65 153/75  Pulse: 62 66 68 69  Temp: 98 F (36.7 C) 98 F (36.7 C) 98.9 F (37.2 C)   TempSrc: Oral Oral Oral   Resp: 18 18 20    Height:      Weight:      SpO2: 96% 95% 95%     Weight change:   Body mass index is 31.61 kg/(m^2).  Intake/Output from previous day:  Intake/Output Summary (Last 24 hours) at 06/01/11 1359 Last data filed at 06/01/11 0700  Gross per 24 hour  Intake 1426.25 ml  Output   1150 ml  Net 276.25 ml    PHYSICAL EXAM: Gen Exam: Awake and alert to person place and month. with clear speech.   Neck: Supple, No JVD.   Chest: B/L Clear.   CVS: S1 S2 Regular, no murmurs.  Abdomen: soft, BS +, non tender, non distended.  Extremities: no edema, lower extremities warm to touch.     CONSULTS:  ID Neurology Critical Care  LAB RESULTS: CBC  Lab 05/31/11 0818 05/28/11 0410 05/26/11 0451  WBC 9.4 10.0 10.3  HGB 12.5 12.0 11.8*  HCT 37.2 37.1 35.3*  PLT 390 269 233  MCV 87.5 89.8 87.6  MCH 29.4 29.1 29.3  MCHC 33.6 32.3 33.4  RDW 14.6 15.2 14.8  LYMPHSABS -- -- --  MONOABS -- -- --  EOSABS -- -- --  BASOSABS -- -- --  BANDABS -- -- --    Chemistries   Lab 06/01/11 0603 05/31/11 0818 05/30/11 0630 05/29/11 0940 05/28/11 0410  NA 136 137 136 136 141  K 3.5 3.3* 3.8 4.4 3.3*  CL 101 100 100 101 101  CO2 24 25 28 27 30   GLUCOSE 122* 83 98 174* 120*  BUN 15 16 16 15 10   CREATININE 1.22* 1.38* 1.79* 1.40* 0.81  CALCIUM 8.8 9.0 8.9 8.7 8.6  MG --  -- -- -- --    GFR Estimated Creatinine Clearance: 52.3 ml/min (by C-G formula based on Cr of 1.22).  Coagulation profile  Lab 06/01/11 0603 05/31/11 0818 05/30/11 0630 05/26/11 0451  INR 3.71* 3.84* 2.45* 1.63*  PROTIME -- -- -- --    Viral Culture:  HSV 1:  Detected.  RADIOLOGY STUDIES/RESULTS: Dg Chest 2 View  05/16/2011  *RADIOLOGY REPORT*  Clinical Data: Headache and weakness.  Vomiting and fever.  CHEST - 2 VIEW  Comparison: Chest x-ray 12/01/2006.  Findings: The heart is enlarged.  Mild bibasilar airspace disease likely reflects atelectasis.  There is mild pulmonary vascular congestion.  Postsurgical changes are noted in the right chest.  A previous thoracotomy has healed.  IMPRESSION:  1.  Cardiomegaly with mild pulmonary vascular congestion. 2.  Mild bibasilar atelectasis. 3.  Postoperative changes of the right chest.  Original Report Authenticated By: Resa Miner. MATTERN, M.D.   Ct Head Wo Contrast  05/24/2011  *RADIOLOGY REPORT*  Clinical Data: New seizure activity  CT HEAD WITHOUT CONTRAST  Technique:  Contiguous axial images were obtained from  the base of the skull through the vertex without contrast.  Comparison: 05/16/2011 Correlation:  MRI brain 05/17/2011  Findings: Normal ventricular morphology. No midline shift or mass effect. Minimal small vessel chronic ischemic changes of deep cerebral white matter. Large area of hypoattenuation identified medial aspect of the left temporal lobe increased in size since previous exam. Remaining brain parenchyma normal appearance. No intracranial hemorrhage or additional infarction identified. No definite extra-axial fluid collection. Visualized paranasal sinuses and mastoid air cells clear. Bones demineralized.  IMPRESSION: Minimal small vessel chronic ischemic changes of deep cerebral white matter. Enlarging area of low attenuation identified at medial aspect of the left temporal lobe; this corresponds to an area of abnormal signal on  prior MR, which was suspicious for encephalitis, possibly related to herpes. Findings are progressive since previous study.  Original Report Authenticated By: Burnetta Sabin, M.D.   Ct Head Wo Contrast  05/16/2011  *RADIOLOGY REPORT*  Clinical Data: Nausea, vomiting  CT HEAD WITHOUT CONTRAST  Technique:  Contiguous axial images were obtained from the base of the skull through the vertex without contrast.  Comparison: CT 05/05/2010  Findings: No acute intracranial hemorrhage.  No focal mass lesion. There is a new cortical hypodensity within the inferior medial left temporal lobe (image 10).  No additional evidence infarction.  No evidence of intracranial hemorrhage.  Paranasal sinuses and mastoid air cells are clear.  Orbits are normal.  IMPRESSION:  New cortical hypodensity within the inferior medial temporal lobe of unclear etiology.  Recommend MRI if the patient has symptoms of acute infarction.  Original Report Authenticated By: Suzy Bouchard, M.D.   Mr Brain Wo Contrast  05/16/2011  *RADIOLOGY REPORT*  Clinical Data: Nausea and headache.  Altered mental status.  Fever.  MRI HEAD WITHOUT CONTRAST  Technique:  Multiplanar, multiecho pulse sequences of the brain and surrounding structures were obtained according to standard protocol without intravenous contrast.  Comparison: CT head without contrast 05/16/2011.  Findings: A focal area of T2 and FLAIR hyperintensity is present within the posterior medial left temporal lobe, along the body and tail of the hippocampus.  There is slight T2 hyperintensity along the undersurface of the right temporal lobe.  No hemorrhage or mass lesion is present.  There is no restricted diffusion associated with these areas.  Other scattered periventricular and subcortical T2 and FLAIR hyperintensities are present in the frontal parietal lobes bilaterally.  Flow is present in the major intracranial arteries.  The patient is status post bilateral lens extractions.  The globes and  orbits are otherwise intact.  The paranasal sinuses and mastoid air cells are clear.  IMPRESSION:  1.  Abnormal signal within the posterior left hippocampus and undersurface of the right temporal lobe.  This raises the possibility of herpes encephalitis.  The differential diagnosis includes a low grade glioma on the left.  A para neoplastic syndrome could have this appearance.  The left-sided appearance could be seen in the setting of recent or ongoing seizure activity. 2.  Mild small vessel disease.  These results were called by telephone on 05/16/2011  at  05:40 p.m. to  Dr. Lauris Poag, who verbally acknowledged these results.  Original Report Authenticated By: Resa Miner. MATTERN, M.D.   Mr Jeri Cos Contrast  05/18/2011  *RADIOLOGY REPORT*  Clinical Data: 71 year old female with nausea, headache, fever and altered mental status.  Abnormal brain MRI without contrast.  MRI HEAD WITH CONTRAST  Technique:  Multiplanar, multiecho pulse sequences of the brain and surrounding structures were obtained according  to standard protocol with intravenous contrast  Contrast: 55mL MULTIHANCE GADOBENATE DIMEGLUMINE 529 MG/ML IV SOLN  Comparison: Brain MRI without contrast 05/16/2011.  Findings: Postcontrast images were obtained to further evaluate the abnormal mesial temporal lobes.  There is asymmetric abnormal enhancement of the left hippocampal complex and para hippocampal gyrus (coronal images 11 - 14).  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   No mass like enhancement on the left.  On the right there is no definite abnormal enhancement. There is subtle decreased T1 signal corresponding to the T2 signal abnormality on the earlier exam.  The superior ophthalmic veins are dilated, but the cavernous sinus appears remain patent.  No other abnormal enhancement.  Basilar cisterns remain patent.  Negative visualized cervical spine. Visualized bone marrow signal is within normal limits.  No  ventriculomegaly, midline shift or mass effect.  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  Decreased bitemporal T1 signal corresponding to the abnormality on the earlier noncontrast study.  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 CSF analysis is pending.  A preliminary report without discrepancy to the above was issued by Dr. Dixon Boos at 2310 hours on 05/17/2011.  Original Report Authenticated By: Randall An, M.D.   Dg Chest Port 1 View  05/26/2011  *RADIOLOGY REPORT*  Clinical Data: Follow up respiratory failure  PORTABLE CHEST - 1 VIEW  Comparison: 05/25/11  Findings:  The endotracheal tube tip is stable above the carina.  There is a nasogastric tube looped in the stomach.  There is asymmetric elevation of the right hemidiaphragm.  Atelectasis is noted within both lung bases, right greater than left.  IMPRESSION:  1.  Continued bibasilar atelectasis with asymmetric elevation of the right hemidiaphragm.  Original Report Authenticated By: Angelita Ingles, M.D.   Dg Chest Port 1 View  05/25/2011  *RADIOLOGY REPORT*  Clinical Data: Endotracheal tube evaluation.  PORTABLE CHEST - 1 VIEW  Comparison: 05/24/2011.  Findings: Endotracheal tube is in satisfactory position. Nasogastric tube is followed into the stomach.  Heart size stable. Mild bibasilar air space disease.  Lungs are low in volume.  No definite pleural fluid.  Old bilateral rib fractures.  IMPRESSION: Low lung volumes with mild bibasilar air space disease.  Original Report Authenticated By: Luretha Rued, M.D.   Dg Chest Port 1 View  05/24/2011  *RADIOLOGY REPORT*  Clinical Data: Intubation  PORTABLE CHEST - 1 VIEW  Comparison: Portable exam 0114 hours compared to 05/16/2011  Findings: Tip of endotracheal tube 4.1 cm above carina. Enlargement of cardiac silhouette. Mediastinal contours and pulmonary vascularity normal. Atherosclerotic calcification aorta.  Bibasilar atelectasis. Lungs otherwise clear. Bones unremarkable. No pneumothorax.  IMPRESSION: Enlargement of cardiac silhouette. Bibasilar atelectasis. Satisfactory endotracheal tube position.  Original Report Authenticated By: Burnetta Sabin, M.D.   Dg Fluoro Guide Ndl Plc/bx  05/25/2011  *RADIOLOGY REPORT*  Clinical Data:  Encephalitis  DIAGNOSTIC LUMBAR PUNCTURE UNDER FLUOROSCOPIC GUIDANCE  Fluoroscopy time:  1.3 minutes.  Technique:  Informed consent was obtained from the patient prior to the procedure, including potential complications of headache, allergy, and pain.   With the patient prone, the lower back was prepped with Betadine.  1% Lidocaine was used for local anesthesia. Lumbar puncture was performed at the rate L3-L4 level using a 20 gauge needle with return of clear yellow tinged  CSF with an opening pressure of 15 cm water.   12 ml of CSF were obtained for laboratory studies.  The patient tolerated the procedure well and there were no apparent complications. The patient is intubated during the procedure.  IMPRESSION: Successful lumbar puncture.  Clear yellow tinged CSF.  Original Report Authenticated By: Suzy Bouchard, M.D.    MEDICATIONS: Scheduled Meds:    . acyclovir  10 mg/kg (Ideal) Intravenous Q8H  . aspirin  81 mg Per Tube Daily  . diltiazem  240 mg Oral Daily  . insulin aspart  0-20 Units Subcutaneous TID WC  . insulin aspart  0-5 Units Subcutaneous QHS  . insulin aspart  4 Units Subcutaneous TID WC  . insulin glargine  30 Units Subcutaneous BID  . metoprolol tartrate  25 mg Oral BID  . phenytoin  150 mg Oral TID  . Warfarin - Pharmacist Dosing Inpatient   Does not apply q1800  . DISCONTD: acyclovir  10 mg/kg (Ideal) Intravenous Q12H  . DISCONTD: insulin aspart  0-20 Units Subcutaneous TID WC  . DISCONTD: insulin aspart  0-20 Units Subcutaneous TID WC  . DISCONTD: insulin aspart  0-20 Units Subcutaneous TID WC  . DISCONTD: insulin aspart  4 Units Subcutaneous TID WC    . DISCONTD: insulin aspart  4 Units Subcutaneous TID WC   Continuous Infusions:    . sodium chloride 75 mL/hr (06/01/11 0758)   PRN Meds:.acetaminophen, fentaNYL, hydrALAZINE  Antibiotics: Anti-infectives     Start     Dose/Rate Route Frequency Ordered Stop   06/01/11 1400   acyclovir (ZOVIRAX) 640 mg in dextrose 5 % 100 mL IVPB        10 mg/kg  63.9 kg (Ideal) 112.8 mL/hr over 60 Minutes Intravenous 3 times per day 06/01/11 1301     06/01/11 0000   dextrose 5 % SOLN 100 mL with acyclovir 50 MG/ML SOLN 640 mg  Status:  Discontinued        10 mg/kg  63.9 kg (Ideal) 112.8 mL/hr over 60 Minutes Intravenous Every 24 hours 06/01/11 1136 06/01/11    06/01/11 0000   dextrose 5 % SOLN 100 mL with acyclovir 50 MG/ML SOLN 640 mg        10 mg/kg  63.9 kg (Ideal) 112.8 mL/hr over 60 Minutes Intravenous Every 8 hours 06/01/11 1254     05/30/11 1800   acyclovir (ZOVIRAX) 640 mg in dextrose 5 % 100 mL IVPB  Status:  Discontinued        10 mg/kg  63.9 kg (Ideal) 112.8 mL/hr over 60 Minutes Intravenous Every 12 hours 05/30/11 1352 06/01/11 1301   05/29/11 1230   fluconazole (DIFLUCAN) tablet 100 mg  Status:  Discontinued        100 mg Oral Daily 05/29/11 1147 05/30/11 1650   05/25/11 1400   acyclovir (ZOVIRAX) 640 mg in dextrose 5 % 100 mL IVPB  Status:  Discontinued        10 mg/kg  63.9 kg (Ideal) 112.8 mL/hr over 60 Minutes Intravenous 3 times per day 05/25/11 1035 05/30/11 1352   05/24/11 0030   acyclovir (ZOVIRAX) 500 mg in dextrose 5 % 100 mL IVPB  Status:  Discontinued        10 mg/kg  50.1 kg (Ideal) 110 mL/hr over 60 Minutes Intravenous 3 times per day 05/23/11 2345 05/25/11 1035   05/23/11 1130   cefTRIAXone (ROCEPHIN) 1 g in dextrose 5 % 50 mL IVPB  Status:  Discontinued        1 g 100 mL/hr over 30 Minutes Intravenous Every 24 hours 05/23/11 1041 05/25/11 1906   05/22/11  1315   cefTRIAXone (ROCEPHIN) 1 g in dextrose 5 % 50 mL IVPB        1 g 100 mL/hr over 30 Minutes  Intravenous  Once 05/22/11 1303 05/22/11 1346          Assessment/Plan: Patient Active Hospital Problem List:  Toxic metabolic encephalopathy  - patient is awake and gives short appropriate answers. Encephalopathy is slowly resolving.  Trouble word finding slowly improving as well.  HSV encephalitis  - PCR positive. Neuro and ID following  -Cont IV Acyclovir - discussed with Dr Linus Salmons. Needs to remain on IV and complete 14 days.  Order for PICC placed 06/01/11.  Acute Respiratory failure  VDRF -extubated on 3/27 (intubated 3/24) resolved  -due to neurological process:   UTI:  3/18 pan sensitive E.coli  -Cftrx D/C'd   PAF (very brief)  -Converted 3/25 AM to NSR, sinus brady with prolonged QT  -amiodarone gtt started 3/24 >. D/C'd 3/25  -No eval indicated @ this time  -change cardizem to 240 mg and continue with  Metoprolol at current dose -Coumadin per pharmacy -INR currently supra-therapeutic  -AKI  -creatinine downtrending now, will decrease IVF today -will need close electrolyte monitoring upon discharge  Htn  - Restart metoprolol at home dose. Cont PRN hydralazine  -cardizem dose adjusted to 240 mg BP improved. -she takes Avapro at home-will not resume this at this time given AKI  DM2 -  -CBGs controlled  -Continue with 30 units of Lantus and 4 units of NovoLog with meals, also on SSI. We'll titrate dosing as needed.   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  -continue with dilantin   Vaginal Yeast Infection  - diflucan started 3/29.-stopped 3/30  DVT prophylaxis: -Not needed as on Coumadin   Dispo:  Patient will D/C to [facility] for rehab 06/02/11.  Code Status: Full code  Ruthine Dose [facility] Pager: [phone]  06/01/2011, 1:59 PM  Attending - I have seen and examined the patient, I have reviewed the above assessment and plan. I also have spoken with patient's husband at bedside, current plans are for discharge to  SNF tomorrow once PICC line is in.  Dr Nena Alexander

## 2011-06-01 NOTE — Progress Notes (Signed)
Clinical Social Worker was notified by MD that pt and pt spouse agreeable to pt going to SNF for short term rehab as LTACH has not yet provided decision for pt. Clinical Education officer, museum met with pt and pt spouse at bedside and pt and pt spouse interested in pt discharging to [facility] Clinical Social Worker contacted facility because per MD, pt medically stable for discharge tomorrow. [facility] stated that they are able to accept pt tomorrow. Clinical Social Worker notified pt and pt spouse and MD. Clinical Social Worker to facilitate pt discharge needs when pt medically stable for discharge.  Drake Leach, MSW, [place] [facility] [phone]

## 2011-06-01 NOTE — Discharge Summary (Signed)
Patient ID: [name] [name] MRN: [id number] DOB/AGE: 70-Apr-1942 70 y.o.  Admit date: 05/22/2011 Discharge date: 06/02/2011  Primary Care Physician:  [name], MD, MD  Consultations:  1.  Neurology                           2.  Infectious Disease                           3.  Pulmonary Critical Care  Procedures:  1.  Lumbar Puncture (05/25/11)                        2.  EEG (05/25/11)                       3.  Insertion of arterial catheter (05/25/11)                       4.  Intubation for airway protection and maintenance (05/24/11)  Discharge Diagnoses:   Principal Problem:  *Toxic metabolic encephalopathy * Herpes Encephalitis * Seizure * Acute respiratory failure with hypoxia  Active Problems:  DIABETES MELLITUS, TYPE II  HYPERLIPIDEMIA  PERIPHERAL NEUROPATHY  HYPERTENSION  Atrial fibrillation  Hypokalemia  E. coli UTI (urinary tract infection)     Medication List  As of 06/01/2011 12:54 PM   STOP taking these medications         cefPROZIL 500 MG tablet      diltiazem 120 MG 24 hr capsule      insulin NPH-insulin regular (70-30) 100 UNIT/ML injection      irbesartan 150 MG tablet         TAKE these medications         acetaminophen 325 MG tablet   Commonly known as: TYLENOL   Take 2 tablets (650 mg total) by mouth every 6 (six) hours as needed (or Fever >/= 101).      aspirin EC 81 MG tablet   Take 81 mg by mouth daily.      calcium-vitamin D 500-200 MG-UNIT per tablet   Commonly known as: OSCAL WITH D   Take 1 tablet by mouth daily.      dextrose 5 % SOLN 100 mL with acyclovir 50 MG/ML SOLN 640 mg   Inject 640 mg into the vein every 8 (eight) hours thru 06/07/11.      diltiazem 240 MG 24 hr capsule   Commonly known as: CARDIZEM CD   Take 1 capsule (240 mg total) by mouth daily.      insulin aspart 100 UNIT/ML injection   Commonly known as: novoLOG   Inject 0-20 Units into the skin 3 (three) times daily with meals.      insulin aspart 100 UNIT/ML  injection   Commonly known as: novoLOG   Inject 0-5 Units into the skin at bedtime.      insulin aspart 100 UNIT/ML injection   Commonly known as: novoLOG   Inject 4 Units into the skin 3 (three) times daily with meals.      insulin glargine 100 UNIT/ML injection   Commonly known as: LANTUS   Inject 30 Units into the skin 2 (two) times daily.      metoprolol tartrate 25 MG tablet   Commonly known as: LOPRESSOR   Take 12.5 mg by mouth 2 (two) times daily.  multivitamin tablet   Take 1 tablet by mouth daily.      phenytoin 50 MG tablet   Commonly known as: DILANTIN   Chew 3 tablets (150 mg total) by mouth 3 (three) times daily.      pravastatin 40 MG tablet   Commonly known as: PRAVACHOL   Take 40 mg by mouth daily.      warfarin 2.5 MG tablet   Commonly known as: COUMADIN   Take 1 tablet (2.5 mg total) by mouth daily.   Start taking on: 06/04/2011            Brief H and P: From the admission note:  [name] [name] is an 71 y.o. female who was seen at the [facility] ED and transferred to [facility] for persstent headache . An MRI study was performed and revealed a area of concern (enhancement in the left temoral lobe). Patient was placed in droplet isolation for possible viral meningitis, and Infectious Dieases was contacted at that time. She was treated with iv acyclovir and iv rocephin and iv vanco. In 3 days she improved amazingly. She was DCed to home and was placed on po abx for an e coli UTI. Today the family brought her back for recurrent nausea and abdominal pain also with confusion and a mild headache.   MRI of the brain completed one week prior to admission showed abnormal signal within the posterior left hippocampus and undersurface of the right temporal lobe.  This raised the possibility of herpes encephalitis.   Initially the etiology of delirium was felt to be UTI.  Urine cultures grew 45,000 colonies of e-coli (pansensitive).  The patient was treated with Rocephin  IV. The day after admission the patient's mental status was normal however on 3/23 she declined and developed delirium.  The course appeared to wax and wane.  Rapid response was called at 11:45 am 3/23 as the patient had become unresponsive to verbal stimuli.  She was having seizure like movements and was transferred to 3111 for closer observation. Infectious Disease was consulted. Rocephin and Acyclovir per pharmacy were initiated as prophylaxis for herpes encephalitis.  Further, neurology was consulted and the patient was started on Dilantin and Ativan for seizure activity.  Subsequently the patient required intubation for airway protection and maintenance on 05/24/11.  She was placed under the care of PCCM.  Neurology attempted a lumbar puncture but was unsuccessful.  Consequently it was done under fluoroscopy on 3/25 by Interventional Radiology.  On 3/24 she began to have episodes of afib with rvr and hypertension.  She was started on amiodarone and an A line was placed for closer monitoring.  The amiodarone drip was able to be d/c'd on 3/25 as the patient converted to NSR.  On 3/28, she was started on diltiazem for afib with tachycardia.  This was titrated up on 3/31 to 240 mg. Daily.  Continues to on coumadin for this, with INR slightly supratherapeutic  EEG was conducted 3/25 abnormal results are listed below but indicated Herpes Encephalitis.  On 05/27/11 [name] [name] was extubated and was able to follow minimal commands.   She had no more seizures after 05/26/11 and was transferred back to the hospitalists service on 05/28/11.  [name] [name] awakened and gradually her confusion waned.   Unfortunately her creatinine rose to 1.7 likely due to the Acyclovir.  Her Acyclovir dosage was reduced to q 12 hours from q 8 hours.  Today on the day of discharge her creatinine has come  down and her acyclovir dosage has been changed back to q 8 hours.  We will recommend that the next venue (ltach vs snf) check a creatinine on  4/4 to ensure that it is stable.  Her base line creatinine was .81 on 05/28/11.  On the morning of discharge her potassium was 2.8.  She received 4 runs of IV potassium and 40 meq of kdur po.  She will need a bmet on 4/4.  Today the patient is awake and oriented to person, place and date.  She answers questions appropriately but is frustrated by trouble finding the correct words to use.  Over all her mental status appears to be improving significantly.  Physical therapy attempted to work with her but she was unable to get out of bed.  She will need intensive physical therapy rehabilitation.  Physical Exam on Discharge: General: Alert, awake, oriented x3, in no acute distress, obese appears deconditioned. HEENT: No bruits, no goiter. Heart: Regular rate and rhythm, without murmurs, rubs, gallops. Lungs: Clear to auscultation bilaterally. Abdomen: Soft, nontender, nondistended, positive bowel sounds. Extremities: No clubbing cyanosis or edema with positive pedal pulses. Neuro: Grossly intact, has difficulty finding the correct word to use.  Filed Vitals:   06/01/11 1027 06/01/11 1300 06/01/11 2224 06/02/11 0649  BP: 153/75 150/74 162/72 158/74  Pulse: 69 70 63 51  Temp:  98.6 F (37 C) 97.5 F (36.4 C) 97.6 F (36.4 C)  TempSrc:  Oral Oral Axillary  Resp:  20 18 18   Height:      Weight:      SpO2:  96% 96% 97%     Intake/Output Summary (Last 24 hours) at 06/02/11 1009 Last data filed at 06/01/11 2200  Gross per 24 hour  Intake    240 ml  Output    700 ml  Net   -460 ml    Basic Metabolic Panel:  Lab [id number] 0600 06/01/11 0603  NA 136 136  K 2.8* 3.5  CL 98 101  CO2 27 24  GLUCOSE 66* 122*  BUN 14 15  CREATININE 1.07 1.22*  CALCIUM 8.9 8.8  MG -- --  PHOS -- --   CBC:  Lab 05/31/11 0818 05/28/11 0410  WBC 9.4 10.0  NEUTROABS -- --  HGB 12.5 12.0  HCT 37.2 37.1  MCV 87.5 89.8  PLT 390 269   CBG:  Lab 06/02/11 0754 06/01/11 2226 06/01/11 1731 06/01/11 1207  06/01/11 0757 05/31/11 2106  GLUCAP 86 91 83 126* 95 106*   Coagulation:  Lab 06/02/11 0600 06/01/11 0603 05/31/11 0818 05/30/11 0630  LABPROT 34.6* 37.3* 38.3* 27.0*  INR 3.37* 3.71* 3.84* 2.45*    Micro Results:  Results for ALIJA, CANTRELLE (MRN [id number]) as of 06/01/2011 11:28  Ref. Range 05/25/2011 11:20  HSV 1 DNA Latest Range: Not Detected  Detected  HSV 2 DNA Latest Range: Not Detected  Not Detected  Specimen source hsv No range found CSF  Western eq encephalitis, IgG Latest Range: < 1:2 titer <1:2  Eastern eq encephalitis, IgG Latest Range: < 1:2 titer <1:2  [place] encephalitis, IgG Latest Range: < 1:2 titer <1:2  [place] virus encephalitis, IgG Latest Range: < 1:2 titer <1:2     Significant Diagnostic Studies:   EEG (05/25/11) IMPRESSION: This is an abnormal EEG secondary to left PLEDS activity. 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  Comparison: 05/16/2011 Correlation:  MRI brain 05/17/2011  Findings: Normal ventricular morphology. No midline shift or mass effect. Minimal small vessel chronic ischemic changes of deep cerebral white matter. Large area of hypoattenuation identified medial aspect of the left temporal lobe increased in size since previous exam. Remaining brain parenchyma normal appearance. No intracranial hemorrhage or additional infarction identified. No definite extra-axial fluid collection. Visualized paranasal sinuses and mastoid air cells clear. Bones demineralized.  IMPRESSION: Minimal small vessel chronic ischemic changes of deep cerebral white matter. Enlarging area of low attenuation identified at medial aspect of the left temporal lobe; this  corresponds to an area of abnormal signal on prior MR, which was suspicious for encephalitis, possibly related to herpes. Findings are progressive since previous study.  Original Report Authenticated By: Burnetta Sabin, M.D.   Mr Jeri Cos Contrast  05/18/2011  *RADIOLOGY REPORT*  Clinical Data: 71 year old female with nausea, headache, fever and altered mental status.  Abnormal brain MRI without contrast.  MRI HEAD WITH CONTRAST  Technique:  Multiplanar, multiecho pulse sequences of the brain and surrounding structures were obtained according to standard protocol with intravenous contrast  Contrast: 8mL MULTIHANCE GADOBENATE DIMEGLUMINE 529 MG/ML IV SOLN  Comparison: Brain MRI without contrast 05/16/2011.  Findings: Postcontrast images were obtained to further evaluate the abnormal mesial temporal lobes.  There is asymmetric abnormal enhancement of the left hippocampal complex and para hippocampal gyrus (coronal images 11 - 14).  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   No mass like enhancement on the left.  On the right there is no definite abnormal enhancement. There is subtle decreased T1 signal corresponding to the T2 signal abnormality on the earlier exam.  The superior ophthalmic veins are dilated, but the cavernous sinus appears remain patent.  No other abnormal enhancement.  Basilar cisterns remain patent.  Negative visualized cervical spine. Visualized bone marrow signal is within normal limits.  No ventriculomegaly, midline shift or mass effect.  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  Decreased bitemporal T1 signal corresponding to the abnormality on the earlier noncontrast study.  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 CSF analysis is pending.  A preliminary report without discrepancy to the above was issued by Dr. Dixon Boos at 2310 hours on 05/17/2011.  Original Report Authenticated By:  Randall An, M.D.   US Renal  05/31/2011  *RADIOLOGY REPORT*  Clinical Data:  Acute renal failure  RENAL/URINARY TRACT ULTRASOUND COMPLETE  Comparison:  None  Findings:  Right Kidney:  Normal in size and parenchymal echogenicity.  No evidence of mass or hydronephrosis. Cyst arising from the upper pole of the right kidney measures 1.5 cm.  Left Kidney:  Normal in size and parenchymal echogenicity.  No evidence of mass or hydronephrosis. There are several cysts within the left kidney.  The largest is in the upper pole measuring 1 cm.  Bladder:  The urinary bladder is collapsed around a Foley catheter.  IMPRESSION:  1.  No evidence for obstructive uropathy. 2.  Renal cyst.  Original Report Authenticated By: Angelita Ingles, M.D.   Dg Chest Port 1 View  05/26/2011  *RADIOLOGY REPORT*  Clinical Data: Follow up respiratory failure  PORTABLE CHEST - 1 VIEW  Comparison: 05/25/11  Findings:  The endotracheal tube tip is  stable above the carina.  There is a nasogastric tube looped in the stomach.  There is asymmetric elevation of the right hemidiaphragm.  Atelectasis is noted within both lung bases, right greater than left.  IMPRESSION:  1.  Continued bibasilar atelectasis with asymmetric elevation of the right hemidiaphragm.  Original Report Authenticated By: Angelita Ingles, M.D.   Dg Chest Port 1 View  05/25/2011  *RADIOLOGY REPORT*  Clinical Data: Endotracheal tube evaluation.  PORTABLE CHEST - 1 VIEW  Comparison: 05/24/2011.  Findings: Endotracheal tube is in satisfactory position. Nasogastric tube is followed into the stomach.  Heart size stable. Mild bibasilar air space disease.  Lungs are low in volume.  No definite pleural fluid.  Old bilateral rib fractures.  IMPRESSION: Low lung volumes with mild bibasilar air space disease.  Original Report Authenticated By: Luretha Rued, M.D.   Dg Chest Port 1 View  05/24/2011  *RADIOLOGY REPORT*  Clinical Data: Intubation  PORTABLE CHEST - 1 VIEW   Comparison: Portable exam 0114 hours compared to 05/16/2011  Findings: Tip of endotracheal tube 4.1 cm above carina. Enlargement of cardiac silhouette. Mediastinal contours and pulmonary vascularity normal. Atherosclerotic calcification aorta. Bibasilar atelectasis. Lungs otherwise clear. Bones unremarkable. No pneumothorax.  IMPRESSION: Enlargement of cardiac silhouette. Bibasilar atelectasis. Satisfactory endotracheal tube position.  Original Report Authenticated By: Burnetta Sabin, M.D.   Dg Fluoro Guide Ndl Plc/bx  05/25/2011  *RADIOLOGY REPORT*  Clinical Data:  Encephalitis  DIAGNOSTIC LUMBAR PUNCTURE UNDER FLUOROSCOPIC GUIDANCE  Fluoroscopy time:  1.3 minutes.  Technique:  Informed consent was obtained from the patient prior to the procedure, including potential complications of headache, allergy, and pain.   With the patient prone, the lower back was prepped with Betadine.  1% Lidocaine was used for local anesthesia. Lumbar puncture was performed at the rate L3-L4 level using a 20 gauge needle with return of clear yellow tinged  CSF with an opening pressure of 15 cm water.   12 ml of CSF were obtained for laboratory studies.  The patient tolerated the procedure well and there were no apparent complications. The patient is intubated during the procedure.  IMPRESSION: Successful lumbar puncture.  Clear yellow tinged CSF.  Original Report Authenticated By: Suzy Bouchard, M.D.      Disposition and Follow-up: Stable for discharge to [facility].  Patient to continue to receive IV Acyclovir thru 06/07/11.  Discharge Orders    Future Orders Please Complete By Expires   Diet - low sodium heart healthy      Diet - low sodium heart healthy      Increase activity slowly      Increase activity slowly      Discharge instructions      Comments:   Please monitor the following 3 labs starting 06/04/11: 1.  INR.  Will likely restart coumadin on 4/4 if INR is less than 3.0 2.  BMET.   Potassium was low (2.8) and repleted on 4/2 3.  BMET, Creatinine was recently high on Acyclovir (1.79) but was 1.07 on 4/2    [name] [name] needs an appointment with Dr. Talbot Grumbling, Infectious Disease, for follow up of her Herpes Encephalitis.    Please call to schedule an appointment in one week.      1.  INR/Coumadin Level check 06/04/11 at [facility].    2.  BMET (creatinine) on 06/04/11 at the facility to ensure that Acyclovir is not causing kidney failure.  Creatinine on 06/02/11 was 1.07 Also to check potassium.  Was 2.8 and repleted on 4/2.          Time spent on Discharge: 45 min.  Signed: Melton Alar 06/02/2011, 10:09 AM [phone]   I have seen and examined the patient, he mental status continues to improve slowly, her renal function is significantly better, she will need close monitoring of her INR and electrolytes as outlined above. Agree with assessment and plan as outlined by Ms [place].  Dr Nena Alexander

## 2011-06-01 NOTE — Progress Notes (Signed)
ANTICOAGULATION CONSULT NOTE - Follow Up Consult  Pharmacy Consult for : Coumadin ; Acyclovir Indication: Hx of atrial fibrillation ; Herpes encephalitis  Allergies  Allergen Reactions  . Procaine Hcl Anaphylaxis    Patient Measurements: Height: 5\' 8"  (172.7 cm) Weight: 207 lb 14.3 oz (94.3 kg) IBW/kg (Calculated) : 63.9    Vital Signs: Temp: 98.9 F (37.2 C) (04/01 0500) Temp src: Oral (04/01 0500) BP: 153/75 mmHg (04/01 1027) Pulse Rate: 69  (04/01 1027)  Labs:  Basename 06/01/11 0603 05/31/11 0818 05/30/11 0630  HGB -- 12.5 --  HCT -- 37.2 --  PLT -- 390 --  APTT -- -- --  LABPROT 37.3* 38.3* 27.0*  INR 3.71* 3.84* 2.45*  HEPARINUNFRC -- -- --  CREATININE 1.22* 1.38* 1.79*  CKTOTAL -- -- --  CKMB -- -- --  TROPONINI -- -- --   Estimated Creatinine Clearance: 52.3 ml/min (by C-G formula based on Cr of 1.22).   Medications:     acyclovir 10 mg/kg (Ideal) Intravenous Q12H  aspirin 81 mg Per Tube Daily  diltiazem 240 mg Oral Daily  insulin aspart 0-20 Units Subcutaneous TID WC  insulin aspart 0-5 Units Subcutaneous QHS  insulin aspart 4 Units Subcutaneous TID WC  insulin glargine 30 Units Subcutaneous BID  metoprolol tartrate 25 mg Oral BID  phenytoin 150 mg Oral TID  Warfarin - Pharmacist Dosing Inpatient  Does not apply q1800  DISCONTD: insulin aspart 0-20 Units Subcutaneous TID WC  DISCONTD: insulin aspart 4 Units Subcutaneous TID WC     Assessment:  Anticoagulation: Coumadin for afib -d/c'd 3/23 for LP (s/p Vitamin K 5mg  IV 3/24). INR 3.71--SUPRAtherapeutic.   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  CrCl improved to > 50 ml/min.  Will need to adjust schedule.   Goal of Therapy:  INR 2-3   Plan:   No Coumadin today.  Increase Acyclovir to 10 mg/kg q 8 hours.  Last dose 06/07/11.  Fredick Schlosser, Craig Guess, Pharm.D. 06/01/2011 12:49 PM

## 2011-06-01 NOTE — Progress Notes (Signed)
Physical Therapy Treatment Patient Details Name: [name] [name] MRN: [id number] DOB: August 05, 1940 Today's Date: 06/01/2011  PT Assessment/Plan  PT - Assessment/Plan Comments on Treatment Session: Pt limited by dizziness this session. All VSS. Patient states that dizziness was 4/10 lying down then increased to 10/10 with sitting EOB ~ 10 mins. RN made aware of session and not to let patient sit EOB without assistance as she tends to lean too far forward over knees increasing her fall risk EOB.  PT Plan: Discharge plan remains appropriate PT Frequency: Min 2X/week Recommendations for Other Services: OT consult Follow Up Recommendations: Skilled nursing facility;LTACH Equipment Recommended: Defer to next venue PT Goals  Acute Rehab PT Goals PT Goal: Supine/Side to Sit - Progress: Progressing toward goal PT Goal: Sit to Supine/Side - Progress: Progressing toward goal PT Goal: Sit to Stand - Progress: Not progressing PT Goal: Stand to Sit - Progress: Not progressing PT Goal: Stand - Progress: Not progressing PT Goal: Ambulate - Progress: Not progressing  PT Treatment Precautions/Restrictions  Precautions Precautions: Fall Precaution Comments: Pt complains of dizziness 10/10 when sitting EOB. 4/10 when lying. Required Braces or Orthoses: No Restrictions Weight Bearing Restrictions: No Mobility (including Balance) Bed Mobility Supine to Sit: 5: Supervision;HOB flat Supine to Sit Details (indicate cue type and reason): Cues and supervision for safety Sitting - Scoot to Edge of Bed: 5: Supervision Sit to Supine: 4: Min assist;With rail Sit to Supine - Details (indicate cue type and reason): A with LEs back into bed as patient became dizzy and requested to lay back into bed.  Transfers Transfers: No Ambulation/Gait Ambulation/Gait: No Stairs: No Wheelchair Mobility Wheelchair Mobility: No  Posture/Postural Control Posture/Postural Control: No significant limitations Dynamic  Sitting Balance Dynamic Sitting - Level of Assistance: 5: Stand by assistance Exercise    End of Session PT - End of Session Equipment Utilized During Treatment: Gait belt Activity Tolerance: Treatment limited secondary to medical complications (Comment) (Pt with increased dizziness in sitting. Return to lying ) Patient left: in bed;with call bell in reach;with bed alarm set Nurse Communication: Other (comment) (Notified for increased dizziness and need for bed alarm) General Behavior During Session: Other (comment) (Lethargic? on/off if not spoken directly to) Cognition: Impaired Cognitive Impairment: Pt slow to respond to orientation questions but oriented to name place and situation. Decreased safety awareness.   Jacqualyn Posey 06/01/2011, 9:41 AM 06/01/2011 Jacqualyn Posey PTA [phone] pager [phone] office

## 2011-06-02 LAB — BASIC METABOLIC PANEL
Chloride: 98 mEq/L (ref 96–112)
GFR calc non Af Amer: 52 mL/min — ABNORMAL LOW (ref >90)
Glucose, Bld: 66 mg/dL — ABNORMAL LOW (ref 70–99)
Potassium: 2.8 mEq/L — ABNORMAL LOW (ref 3.5–5.1)
Sodium: 136 mEq/L (ref 135–145)

## 2011-06-02 LAB — GLUCOSE, CAPILLARY: Glucose-Capillary: 91 mg/dL (ref 70–99)

## 2011-06-02 MED ORDER — POTASSIUM CHLORIDE CRYS ER 20 MEQ PO TBCR
40.0000 meq | EXTENDED_RELEASE_TABLET | Freq: Two times a day (BID) | ORAL | Status: DC
  Administered 2011-06-02: 40 meq via ORAL
  Filled 2011-06-02: qty 2

## 2011-06-02 MED ORDER — DEXTROSE 5 % IV SOLN: 10.0000 mg/kg | Freq: Three times a day (TID) | INTRAVENOUS | Status: AC

## 2011-06-02 MED ORDER — HEPARIN SOD (PORK) LOCK FLUSH 100 UNIT/ML IV SOLN
250.0000 [IU] | INTRAVENOUS | Status: AC | PRN
  Administered 2011-06-02: 250 [IU]

## 2011-06-02 MED ORDER — WARFARIN SODIUM 2.5 MG PO TABS: 2.5000 mg | ORAL_TABLET | Freq: Every day | ORAL | Status: DC

## 2011-06-02 MED ORDER — CAMPHOR-MENTHOL 0.5-0.5 % EX LOTN
TOPICAL_LOTION | CUTANEOUS | Status: DC | PRN
  Filled 2011-06-02: qty 222

## 2011-06-02 MED ORDER — POTASSIUM CHLORIDE 10 MEQ/100ML IV SOLN
10.0000 meq | INTRAVENOUS | Status: AC
  Administered 2011-06-02 (×4): 10 meq via INTRAVENOUS
  Filled 2011-06-02 (×4): qty 100

## 2011-06-02 NOTE — Progress Notes (Signed)
Pt discharged to [facility] per MD order.  Pt discharged with PICC line per order, saline locked by IV team.  All belongings sent with patient and all questions answered.  Pt transported by [facility], discharge packet sent.

## 2011-06-02 NOTE — Progress Notes (Signed)
.  Clinical social worker assisted with patient discharge to skilled nursing facility, [facility]. .Patient transportation will be provided by [employer] [facility] with patient chart copy. [facility] transportation scheduled for 4pm. .No further Clinical Social Work needs, signing off.   Dorathy Kinsman, [place] .06/02/2011 14:44pm

## 2011-06-02 NOTE — Progress Notes (Signed)
ANTICOAGULATION CONSULT NOTE - Follow Up Consult  Pharmacy Consult for : Coumadin Indication: atrial fibrillation  Allergies  Allergen Reactions  . Procaine Hcl Anaphylaxis    Patient Measurements: Height: 5\' 8"  (172.7 cm) Weight: 207 lb 14.3 oz (94.3 kg) IBW/kg (Calculated) : 63.9    Vital Signs: Temp: 97.6 F (36.4 C) (04/02 0649) Temp src: Axillary (04/02 0649) BP: 170/79 mmHg (04/02 1030) Pulse Rate: 60  (04/02 1030)  Labs:  Basename 06/02/11 0600 06/01/11 0603 05/31/11 0818  HGB -- -- 12.5  HCT -- -- 37.2  PLT -- -- 390  APTT -- -- --  LABPROT 34.6* 37.3* 38.3*  INR 3.37* 3.71* 3.84*  HEPARINUNFRC -- -- --  CREATININE 1.07 1.22* 1.38*  CKTOTAL -- -- --  CKMB -- -- --  TROPONINI -- -- --   Estimated Creatinine Clearance: 59.6 ml/min (by C-G formula based on Cr of 1.07).     acyclovir 10 mg/kg (Ideal) Intravenous Q8H  aspirin 81 mg Per Tube Daily  diltiazem 240 mg Oral Daily  insulin aspart 0-20 Units Subcutaneous TID WC  insulin aspart 0-5 Units Subcutaneous QHS  insulin aspart 4 Units Subcutaneous TID WC  insulin glargine 30 Units Subcutaneous BID  metoprolol tartrate 50 mg Oral BID  phenytoin 150 mg Oral TID  potassium chloride 10 mEq Intravenous Q1 Hr x 4  potassium chloride 40 mEq Oral BID WC  Warfarin - Pharmacist Dosing Inpatient  Does not apply q1800  DISCONTD: acyclovir 10 mg/kg (Ideal) Intravenous Q12H  DISCONTD: insulin aspart 0-20 Units Subcutaneous TID WC  DISCONTD: insulin aspart 0-20 Units Subcutaneous TID WC  DISCONTD: insulin aspart 0-20 Units Subcutaneous TID WC  DISCONTD: insulin aspart 4 Units Subcutaneous TID WC  DISCONTD: insulin aspart 4 Units Subcutaneous TID WC  DISCONTD: metoprolol tartrate 25 mg Oral BID    Assessment:  Patient is on chronic Coumadin for A-fib.  Home dose Coumadin 2.5 mg daily except 5 mg on Wed and Fri.  INR slowly trending down while holding Coumadin doses and following Vitamin K (3/24).  INR  3.37.  Goal of Therapy:   INR 2-3   Plan:   No Coumadin today.    Outpatient management of Coumadin upon discharge today.  Marlenne [place], Craig Guess, Pharm.D. 06/02/2011 11:19 AM

## 2011-06-02 NOTE — Progress Notes (Signed)
   CARE MANAGEMENT NOTE 06/02/2011  Patient:  [name] [name], [name] [name]   Account Number:  [id number]  Date Initiated:  05/29/2011  Documentation initiated by:  Marvetta Gibbons  Subjective/Objective Assessment:   Pt admitted with toxic metabolic encephalopathy     Action/Plan:   PTA pt lived at home with family, PT/OT evals ordered, LTAC referral   Anticipated DC Date:  06/02/2011   Anticipated DC Plan:  SKILLED NURSING FACILITY  In-house referral  Clinical Social Worker      DC Planning Services  CM consult      Choice offered to / List presented to:             Status of service:  Completed, signed off Medicare Important Message given?   (If response is "NO", the following Medicare IM given date fields will be blank) Date Medicare IM given:   Date Additional Medicare IM given:    Discharge Disposition:  [place]  Per UR Regulation:  Reviewed for med. necessity/level of care/duration of stay  If discussed at [place] of Stay Meetings, dates discussed:    Comments:  PCP- [place] (spouse)- [phone] cell  06/02/11- 55- Marvetta Gibbons RN, BSN [phone] Pt for discharge today to [facility]- CSW following for placement needs.  06/01/11- 1300- Marvetta Gibbons RN, BSN [phone] Per Sonia Baller with [facility]) there are no LTAC beds available today- still awaiting admission team desicion- Sonia Baller to let this CM know when admission team desicion made. Pt may be more SNF appropriate. NP- Audrea Muscat [place]- notified.- Order to be placed for PICC- will move towards SNF placement. CSW aware.  05/29/11- 1445- Marvetta Gibbons RN, BSN [phone] Spoke with pt's husband at bedside [name] [name]) regarding d/c plans. Per conversation he would prefer [facility] if pt is eligible for that option- is ok with second option of [facility]. CSW to come talk with him regarding- SNF option. Per Sonia Baller with [facility]- there are no beds available today- will reassess on Monday April 1.  05/29/11-  [place] RN, BSN [phone] Referral made to [facility]- d/c planning LTAC vs SNF. CM to follow.

## 2011-06-08 ENCOUNTER — Telehealth: Payer: Self-pay | Admitting: Internal Medicine

## 2011-06-08 NOTE — Telephone Encounter (Signed)
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 According to the discharge summary, she needed a followup BMP and INR. Left a message in the patient's home, asked for a call back. I like to be sure  she is getting all the care she needs.

## 2011-06-09 ENCOUNTER — Telehealth: Payer: Self-pay | Admitting: *Deleted

## 2011-06-09 NOTE — Telephone Encounter (Signed)
Spoke with pt's husband.

## 2011-06-09 NOTE — Telephone Encounter (Signed)
Spoke with pt's husband. He stated that the pt is in the [facility] rehab center. He states that she is able to walk some but her memory is not 100% yet. He says he is going to a meeting Thursday at the rehab center to find out more about his wife's condition & how long she will be there. I told him to call us back & keep [place] updated.

## 2011-06-09 NOTE — Telephone Encounter (Signed)
Thank you :)

## 2011-06-09 NOTE — Telephone Encounter (Signed)
Message copied by Alphonsus Sias on Tue Jun 09, 2011  9:47 AM ------      Message from: Bradley Ferris      Created: Tue Jun 09, 2011  9:26 AM      Contact: Glorious Peach       Returning call regarding update on his wife's condition

## 2011-07-29 ENCOUNTER — Encounter: Payer: Self-pay | Admitting: Internal Medicine

## 2011-07-29 ENCOUNTER — Ambulatory Visit (INDEPENDENT_AMBULATORY_CARE_PROVIDER_SITE_OTHER): Payer: Medicare Other | Admitting: Internal Medicine

## 2011-07-29 VITALS — BP 126/62 | HR 60 | Temp 97.7°F | Wt 201.0 lb

## 2011-07-29 DIAGNOSIS — G92 Toxic encephalopathy: Secondary | ICD-10-CM

## 2011-07-29 DIAGNOSIS — E119 Type 2 diabetes mellitus without complications: Secondary | ICD-10-CM

## 2011-07-29 DIAGNOSIS — E785 Hyperlipidemia, unspecified: Secondary | ICD-10-CM

## 2011-07-29 DIAGNOSIS — I4891 Unspecified atrial fibrillation: Secondary | ICD-10-CM

## 2011-07-29 DIAGNOSIS — G9340 Encephalopathy, unspecified: Secondary | ICD-10-CM

## 2011-07-29 DIAGNOSIS — I10 Essential (primary) hypertension: Secondary | ICD-10-CM

## 2011-07-29 MED ORDER — PRAVASTATIN SODIUM 40 MG PO TABS: 40.0000 mg | ORAL_TABLET | Freq: Every day | ORAL | Status: DC

## 2011-07-29 MED ORDER — INSULIN GLARGINE 100 UNIT/ML ~~LOC~~ SOLN: 35.0000 [IU] | Freq: Every day | SUBCUTANEOUS | Status: DC

## 2011-07-29 MED ORDER — METOPROLOL TARTRATE 25 MG PO TABS: 12.5000 mg | ORAL_TABLET | Freq: Two times a day (BID) | ORAL | Status: DC

## 2011-07-29 NOTE — Assessment & Plan Note (Signed)
Good BP today, no change, recheck a potassium level

## 2011-07-29 NOTE — Assessment & Plan Note (Signed)
Recommend to see if the Coumadin clinic

## 2011-07-29 NOTE — Assessment & Plan Note (Signed)
RF meds

## 2011-07-29 NOTE — Patient Instructions (Signed)
Please see Dr. [name], in the meantime take Lantus 35 units once a day. Keep the other insulin just in case. Please make an appointment to see the Coumadin clinic Come back in 3 months

## 2011-07-29 NOTE — Assessment & Plan Note (Addendum)
Before the admission 05-2011, she was doing NPH 70 units in the morning and 10 in the afternoon. Currently doing Lantus 30 units in the morning and 15 in the afternoon plus sliding scale. Her CBGs have been running low, this morning was 52. She has essentially not use the sliding-scale. Plan: Take Lantus one time a day 35 units. Keep the sliding scale  just in case, needs to see Dr. [name]

## 2011-07-29 NOTE — Assessment & Plan Note (Signed)
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 Gradually improving. Saw neurology yesterday.

## 2011-07-29 NOTE — Progress Notes (Signed)
  Subjective:    Patient ID: [name] [name], female    DOB: 1940-10-28, 71 y.o.   MRN: [id number]  [facility] followup Admitted 05/16/2011 for 3 days, she had mental status changes, MRI show enhancement of the left temporal lobe. There was a question of encephalitis and she was treated empirically with acyclovir and iv antibiotics but eventually discharged home after she improved. She was readmitted 05/22/2011 x 10 days . She was eventually diagnosed with herpes encephalitis, she had a seizure and acute respiratory failure that required  intubation temporarily.  As far as the a fibrillation, she did have episodes of RVR.  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 of COMMON MIGRAINE  Arm Fx 12-2010, no surgery  05-2011: Herpetic encephalopathy, respiratory failure, seizures  Past Surgical History:  removal of lung mass   Family History:  DM-- F and sisters  MI-- F at age 71  colon ca--no  breast ca--no   Social History:  Married, 1 son  moved from [place]-- quit in the 80s  ETOH-- rarely    Review of Systems She left the rehabilitation place a week ago. Since then she is feeling stronger gradually. Still has problems with her memory, fortunately the short-term memory is okay he is just certain things that she can't recall. Her balance is not as good as before but she denies any recent falls. As far as her activities of daily living she has been able to take her own showers ,feed herself. Husband is doing most of the cooking. Her insulin regimen was changed, see assessment and plan. CBGs are  rather the low side. Was 52 this morning. Her appetite is coming back. She saw the neurologist Dr. Leonie Man yesterday.    Objective:   Physical Exam  Alert, oriented in time, space, did not know who the president was. Lungs -- normal respiratory effort, no intercostal retractions, no accessory muscle use, and normal breath sounds.   Heart-- normal rate, regular rhythm, no murmur, and no gallop.   Extremities-- no pretibial edema bilaterally  Psych-- cooperative with normal attention span and concentration.  not anxious appearing and not depressed appearing.     Assessment & Plan:   Today , I spent more than 32 min with the patient, >50% of the time counseling, and /or reviewing the chart and labs ordered by other providers

## 2011-07-30 LAB — BASIC METABOLIC PANEL
BUN: 13 mg/dL (ref 6–23)
Chloride: 99 mEq/L (ref 96–112)
GFR: 66.72 mL/min (ref >60.00)
Potassium: 4.2 mEq/L (ref 3.5–5.1)
Sodium: 136 mEq/L (ref 135–145)

## 2011-07-31 ENCOUNTER — Ambulatory Visit (INDEPENDENT_AMBULATORY_CARE_PROVIDER_SITE_OTHER): Payer: Medicare Other | Admitting: *Deleted

## 2011-07-31 ENCOUNTER — Telehealth: Payer: Self-pay | Admitting: Internal Medicine

## 2011-07-31 DIAGNOSIS — I4892 Unspecified atrial flutter: Secondary | ICD-10-CM

## 2011-07-31 DIAGNOSIS — Z7901 Long term (current) use of anticoagulants: Secondary | ICD-10-CM

## 2011-07-31 DIAGNOSIS — I4891 Unspecified atrial fibrillation: Secondary | ICD-10-CM

## 2011-07-31 LAB — POCT INR: INR: 1.5

## 2011-07-31 NOTE — Telephone Encounter (Signed)
Please advise      KP 

## 2011-07-31 NOTE — Telephone Encounter (Signed)
please check for samples, gave samples if available. She can discuss with endocrinology next week

## 2011-07-31 NOTE — Telephone Encounter (Signed)
Patient's husband states that the Humalog is too expensive and would like something less expensive. Will go to endocrinologist next week to ask for suggestions as well.

## 2011-07-31 NOTE — Telephone Encounter (Signed)
Discussed with patient and the Endocrinology apt is next week and Jenny Reichmann is on his way to pick up the Humalog.     KP

## 2011-08-03 ENCOUNTER — Encounter: Payer: Self-pay | Admitting: *Deleted

## 2011-08-03 ENCOUNTER — Telehealth: Payer: Self-pay | Admitting: Internal Medicine

## 2011-08-03 MED ORDER — PHENYTOIN 50 MG PO CHEW: 150.0000 mg | CHEWABLE_TABLET | Freq: Three times a day (TID) | ORAL | Status: DC

## 2011-08-03 MED ORDER — DILTIAZEM HCL ER COATED BEADS 240 MG PO CP24: 240.0000 mg | ORAL_CAPSULE | Freq: Every day | ORAL | Status: DC

## 2011-08-03 NOTE — Telephone Encounter (Signed)
Spoke with pt's husband. The pt needs a refill on cardiezem & dilantin. Refill done.

## 2011-08-03 NOTE — Telephone Encounter (Signed)
Pts husband called and would like to speak with Ria Comment regarding the patient's medication. Call back # [phone]

## 2011-08-04 ENCOUNTER — Telehealth: Payer: Self-pay | Admitting: Internal Medicine

## 2011-08-04 NOTE — Telephone Encounter (Signed)
Discussed with pt

## 2011-08-04 NOTE — Telephone Encounter (Signed)
Questions regarding letter received on labs please call

## 2011-08-06 ENCOUNTER — Other Ambulatory Visit: Payer: Medicare Other

## 2011-08-06 ENCOUNTER — Ambulatory Visit (INDEPENDENT_AMBULATORY_CARE_PROVIDER_SITE_OTHER): Payer: Medicare Other | Admitting: Endocrinology

## 2011-08-06 ENCOUNTER — Encounter: Payer: Self-pay | Admitting: Endocrinology

## 2011-08-06 VITALS — BP 122/58 | HR 59 | Temp 97.1°F | Ht 69.0 in | Wt 202.0 lb

## 2011-08-06 DIAGNOSIS — E119 Type 2 diabetes mellitus without complications: Secondary | ICD-10-CM

## 2011-08-06 NOTE — Patient Instructions (Addendum)
please change your insulin to NPH, 40 units each morning.   Please reuturn here in 3 months.   check your blood sugar 2 times a day.  vary the time of day when you check, between before the 3 meals, and at bedtime.  also check if you have symptoms of your blood sugar being too high or too low.  please keep a record of the readings and bring it to your next appointment here.  please call us sooner if you are having low blood sugar episodes.

## 2011-08-06 NOTE — Progress Notes (Signed)
Subjective:    Patient ID: [name] [name], female    DOB: [birth year], 71 y.o.   MRN: [id number]  HPI Pt returns for f/u of insulin-requiring DM (dx'ed 0000000; complicated by retinopathy and peripheral sensory neuropathy).  She was recently in the hospital, then a nursing facility.  She was having hypoglycemia in the middle of the night.   Past Medical History  Diagnosis Date  . Blindness of left eye   . Eye muscle weakness     Right eye weakness after cataract surgery  . Common migraine     ?hx of  . DIABETES MELLITUS, TYPE II 03/27/2006  . HYPERLIPIDEMIA 03/27/2006  . 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 HYPERTENSION 03/27/2006  . Atrial fibrillation     SVT dx 2007, cath 2007 mild CAD, then had an cardioversion, ablation; still on coumadin , has occ palpitation, EKG 03-2010 NSR  . Headache 05/07/2009  . LUNG NODULE 09/01/2006    Excision, Bx Benighn  . Osteopenia 2004    Dexa 2004 showed Osteopenia, DEXA 03/2007 normal  . Seizures     Past Surgical History  Procedure Date  . Shoulder surgery     History   Social History  . Marital Status: Married    Spouse Name: N/A    Number of Children: 1  . Years of Education: N/A   Occupational History  .     Social History Main Topics  . Smoking status: Former Smoker    Quit date: 03/02/1978  . Smokeless tobacco: Not on file  . Alcohol Use: No  . Drug Use: No  . Sexually Active: No   Other Topics Concern  . Not on file   Social History Narrative   Moved from [place] in the 80's    Current Outpatient Prescriptions on File Prior to Visit  Medication Sig Dispense Refill  . acetaminophen (TYLENOL) 325 MG tablet Take 2 tablets (650 mg total) by mouth every 6 (six) hours as needed (or Fever >/= 101).      [name] aspirin EC 81 MG tablet Take 81 mg by mouth daily.      . calcium-vitamin D (OSCAL WITH D) 500-200 MG-UNIT per tablet Take 1 tablet by mouth daily.      [name] diltiazem (CARDIZEM CD) 240 MG 24 hr capsule Take 1  capsule (240 mg total) by mouth daily.  30 capsule  5  . metoprolol tartrate (LOPRESSOR) 25 MG tablet Take 0.5 tablets (12.5 mg total) by mouth 2 (two) times daily.  60 tablet  12  . Multiple Vitamin (MULTIVITAMIN) tablet Take 1 tablet by mouth daily.        . phenytoin (DILANTIN) 50 MG tablet Chew 3 tablets (150 mg total) by mouth 3 (three) times daily.  90 tablet  0  . pravastatin (PRAVACHOL) 40 MG tablet Take 1 tablet (40 mg total) by mouth daily.  30 tablet  12  . DISCONTD: warfarin (COUMADIN) 2.5 MG tablet Take 1 tablet (2.5 mg total) by mouth daily.  10 tablet  0  . insulin NPH (HUMULIN N,NOVOLIN N) 100 UNIT/ML injection Inject 40 Units into the skin daily before breakfast.        No current facility-administered medications on file prior to visit.    Allergies  Allergen Reactions  . Procaine Hcl Anaphylaxis    Family History  Problem Relation Age of Onset  . Diabetes Father   . Heart attack Father 90  . Diabetes Sister   . Cancer Neg Hx  no hx of colon or breast cancer    BP 122/58  Pulse 59  Temp 97.1 F (36.2 C) (Oral)  Ht 5\' 9"  (1.753 m)  Wt 202 lb (91.627 kg)  BMI 29.83 kg/m2  SpO2 96%    Review of Systems She has lost 13 lbs, since last ov.      Objective:   Physical Exam VITAL SIGNS:  See vs page GENERAL: no distress SKIN:  Insulin injection sites at the anterior thighs are normal.       Assessment & Plan:  DM.  Based on the pattern of her cbg's, she needs some adjustment in her therapy.  therapy limited by pt's need for a simple, inexpensive regimen

## 2011-08-07 ENCOUNTER — Emergency Department (HOSPITAL_BASED_OUTPATIENT_CLINIC_OR_DEPARTMENT_OTHER)
Admission: EM | Admit: 2011-08-07 | Discharge: 2011-08-07 | Disposition: A | Discharge status: Home / Self Care | Payer: Medicare Other | Attending: Emergency Medicine | Admitting: Emergency Medicine

## 2011-08-07 ENCOUNTER — Encounter (HOSPITAL_BASED_OUTPATIENT_CLINIC_OR_DEPARTMENT_OTHER): Payer: Self-pay | Admitting: *Deleted

## 2011-08-07 ENCOUNTER — Emergency Department (HOSPITAL_BASED_OUTPATIENT_CLINIC_OR_DEPARTMENT_OTHER): Payer: Medicare Other

## 2011-08-07 DIAGNOSIS — E119 Type 2 diabetes mellitus without complications: Secondary | ICD-10-CM | POA: Insufficient documentation

## 2011-08-07 DIAGNOSIS — I10 Essential (primary) hypertension: Secondary | ICD-10-CM | POA: Insufficient documentation

## 2011-08-07 DIAGNOSIS — Z87891 Personal history of nicotine dependence: Secondary | ICD-10-CM | POA: Insufficient documentation

## 2011-08-07 DIAGNOSIS — M25519 Pain in unspecified shoulder: Secondary | ICD-10-CM | POA: Insufficient documentation

## 2011-08-07 DIAGNOSIS — Z7982 Long term (current) use of aspirin: Secondary | ICD-10-CM | POA: Insufficient documentation

## 2011-08-07 DIAGNOSIS — Z794 Long term (current) use of insulin: Secondary | ICD-10-CM | POA: Insufficient documentation

## 2011-08-07 DIAGNOSIS — I4891 Unspecified atrial fibrillation: Secondary | ICD-10-CM | POA: Insufficient documentation

## 2011-08-07 DIAGNOSIS — E785 Hyperlipidemia, unspecified: Secondary | ICD-10-CM | POA: Insufficient documentation

## 2011-08-07 DIAGNOSIS — Z79899 Other long term (current) drug therapy: Secondary | ICD-10-CM | POA: Insufficient documentation

## 2011-08-07 DIAGNOSIS — M810 Age-related osteoporosis without current pathological fracture: Secondary | ICD-10-CM | POA: Insufficient documentation

## 2011-08-07 DIAGNOSIS — M25512 Pain in left shoulder: Secondary | ICD-10-CM

## 2011-08-07 HISTORY — DX: Unspecified convulsions: R56.9

## 2011-08-07 MED ORDER — PREDNISONE 10 MG PO TABS: 20.0000 mg | ORAL_TABLET | Freq: Two times a day (BID) | ORAL | Status: DC

## 2011-08-07 MED ORDER — HYDROCODONE-ACETAMINOPHEN 5-325 MG PO TABS
2.0000 | ORAL_TABLET | Freq: Once | ORAL | Status: AC
  Administered 2011-08-07: 2 via ORAL
  Filled 2011-08-07: qty 2

## 2011-08-07 MED ORDER — HYDROCODONE-ACETAMINOPHEN 5-500 MG PO TABS: 1.0000 | ORAL_TABLET | Freq: Four times a day (QID) | ORAL | Status: AC | PRN

## 2011-08-07 NOTE — ED Provider Notes (Signed)
History     CSN: [id number]  Arrival date & time 08/07/11  1120   First MD Initiated Contact with Patient 08/07/11 1146      Chief Complaint  Patient presents with  . Shoulder Pain    (Consider location/radiation/quality/duration/timing/severity/associated sxs/prior treatment) HPI Comments: Patient was recently in hospital in icu for seizure.  Went to rehab facility.  Now walking with cane/walker.  No new injury or trauma.  No fall.  Patient is a 71 y.o. female presenting with shoulder pain. The history is provided by the patient and the spouse.  Shoulder Pain This is a new problem. Episode onset: 3 days ago. The problem occurs constantly. The problem has been gradually worsening. Exacerbated by: movement, palpation. The symptoms are relieved by nothing. She has tried acetaminophen for the symptoms. The treatment provided no relief.    Past Medical History  Diagnosis Date  . Blindness of left eye   . Eye muscle weakness     Right eye weakness after cataract surgery  . Common migraine     ?hx of  . DIABETES MELLITUS, TYPE II 03/27/2006  . HYPERLIPIDEMIA 03/27/2006  . 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 HYPERTENSION 03/27/2006  . Atrial fibrillation     SVT dx 2007, cath 2007 mild CAD, then had an cardioversion, ablation; still on coumadin , has occ palpitation, EKG 03-2010 NSR  . Headache 05/07/2009  . LUNG NODULE 09/01/2006    Excision, Bx Benighn  . Osteopenia 2004    Dexa 2004 showed Osteopenia, DEXA 03/2007 normal  . Seizures     Past Surgical History  Procedure Date  . Shoulder surgery     Family History  Problem Relation Age of Onset  . Diabetes Father   . Heart attack Father 20  . Diabetes Sister   . Cancer Neg Hx     no hx of colon or breast cancer    History  Substance Use Topics  . Smoking status: Former Smoker    Quit date: 03/02/1978  . Smokeless tobacco: Not on file  . Alcohol Use: No    OB History    Grav Para Term Preterm Abortions TAB SAB  Ect Mult Living                  Review of Systems  All other systems reviewed and are negative.    Allergies  Procaine hcl  Home Medications   Current Outpatient Rx  Name Route Sig Dispense Refill  . INSULIN ISOPHANE HUMAN 100 UNIT/ML [place] SUSP Subcutaneous Inject into the skin.    . ACETAMINOPHEN 325 MG PO TABS Oral Take 2 tablets (650 mg total) by mouth every 6 (six) hours as needed (or Fever >/= 101).    . ASPIRIN EC 81 MG PO TBEC Oral Take 81 mg by mouth daily.    [name] CALCIUM CARBONATE-VITAMIN D 500-200 MG-UNIT PO TABS Oral Take 1 tablet by mouth daily.    [name] DILTIAZEM HCL ER COATED BEADS 240 MG PO CP24 Oral Take 1 capsule (240 mg total) by mouth daily. 30 capsule 5  . METOPROLOL TARTRATE 25 MG PO TABS Oral Take 0.5 tablets (12.5 mg total) by mouth 2 (two) times daily. 60 tablet 12  . ONE-DAILY MULTI VITAMINS PO TABS Oral Take 1 tablet by mouth daily.      [name] PHENYTOIN 50 MG PO CHEW Oral Chew 3 tablets (150 mg total) by mouth 3 (three) times daily. 90 tablet 0  . PRAVASTATIN SODIUM 40 MG PO  TABS Oral Take 1 tablet (40 mg total) by mouth daily. 30 tablet 12  . WARFARIN SODIUM 2.5 MG PO TABS Oral Take 1 tablet (2.5 mg total) by mouth daily. 10 tablet 0    BP 129/53  Pulse 63  Temp(Src) 97.8 F (36.6 C) (Oral)  Resp 18  SpO2 100%  Physical Exam  Nursing note and vitals reviewed. Constitutional: She is oriented to person, place, and time. She appears well-developed and well-nourished. No distress.  HENT:  Head: Normocephalic and atraumatic.  Neck: Normal range of motion. Neck supple.  Musculoskeletal:       The right shoulder is noted to be ttp in the posterior aspect.  There is pain with range of motion, especially abduction and external rotation.  Neurological: She is alert and oriented to person, place, and time.  Skin: Skin is warm and dry. She is not diaphoretic.    ED Course  Procedures (including critical care time)  Labs Reviewed - No data to display No results  found.   No diagnosis found.    MDM  The xrays show degenerative changes but no acute process.  Her symptoms and exam seem like rotator cuff tendinitis.  Will treat with prednisone, hydrocodone.        Veryl Speak, MD 08/07/11 1257

## 2011-08-07 NOTE — ED Notes (Signed)
Right shoulder pain. No known injury. Has been using a cane and walker to walk x 2 weeks after coming home from a nursing home. She was in a nursing home due to seizure disorder per husband.

## 2011-08-07 NOTE — Discharge Instructions (Signed)
Shoulder Pain The shoulder is a ball and socket joint. The muscles and tendons (rotator cuff) are what keep the shoulder in its joint and stable. This collection of muscles and tendons holds in the head (ball) of the humerus (upper arm bone) in the fossa (cup) of the scapula (shoulder blade). Today no reason was found for your shoulder pain. Often pain in the shoulder may be treated conservatively with temporary immobilization. For example, holding the shoulder in one place using a sling for rest. Physical therapy may be needed if problems continue. HOME CARE INSTRUCTIONS   Apply ice to the sore area for 15 to 20 minutes, 3 to 4 times per day for the first 2 days. Put the ice in a plastic bag. Place a towel between the bag of ice and your skin.   If you have or were given a shoulder sling and straps, do not remove for as long as directed by your caregiver or until you see a caregiver for a follow-up examination. If you need to remove it to shower or bathe, move your arm as little as possible.   Sleep on several pillows at night to lessen swelling and pain.   Only take over-the-counter or prescription medicines for pain, discomfort, or fever as directed by your caregiver.   Keep any follow-up appointments in order to avoid any type of permanent shoulder disability or chronic pain problems.  SEEK MEDICAL CARE IF:   Pain in your shoulder increases or new pain develops in your arm, hand, or fingers.   Your hand or fingers are colder than your other hand.   You do not obtain pain relief with the medications or your pain becomes worse.  SEEK IMMEDIATE MEDICAL CARE IF:   Your arm, hand, or fingers are numb or tingling.   Your arm, hand, or fingers are swollen, painful, or turn white or blue.   You develop chest pain or shortness of breath.  MAKE SURE YOU:   Understand these instructions.   Will watch your condition.   Will get help right away if you are not doing well or get worse.    Document Released: 11/26/2004 Document Revised: 02/05/2011 Document Reviewed: 01/31/2011 [employer] Patient Information 2012 [employer]. 

## 2011-08-12 ENCOUNTER — Emergency Department (HOSPITAL_COMMUNITY)
Admission: EM | Admit: 2011-08-12 | Discharge: 2011-08-12 | Disposition: A | Discharge status: Home / Self Care | Payer: Medicare Other | Attending: Emergency Medicine | Admitting: Emergency Medicine

## 2011-08-12 ENCOUNTER — Encounter (HOSPITAL_COMMUNITY): Payer: Self-pay | Admitting: Emergency Medicine

## 2011-08-12 ENCOUNTER — Emergency Department (HOSPITAL_COMMUNITY): Payer: Medicare Other

## 2011-08-12 DIAGNOSIS — S93409A Sprain of unspecified ligament of unspecified ankle, initial encounter: Secondary | ICD-10-CM | POA: Insufficient documentation

## 2011-08-12 DIAGNOSIS — I4891 Unspecified atrial fibrillation: Secondary | ICD-10-CM | POA: Insufficient documentation

## 2011-08-12 DIAGNOSIS — I10 Essential (primary) hypertension: Secondary | ICD-10-CM | POA: Insufficient documentation

## 2011-08-12 DIAGNOSIS — M7550 Bursitis of unspecified shoulder: Secondary | ICD-10-CM

## 2011-08-12 DIAGNOSIS — H3500 Unspecified background retinopathy: Secondary | ICD-10-CM | POA: Insufficient documentation

## 2011-08-12 DIAGNOSIS — X500XXA Overexertion from strenuous movement or load, initial encounter: Secondary | ICD-10-CM | POA: Insufficient documentation

## 2011-08-12 DIAGNOSIS — I2510 Atherosclerotic heart disease of native coronary artery without angina pectoris: Secondary | ICD-10-CM | POA: Insufficient documentation

## 2011-08-12 DIAGNOSIS — Z7901 Long term (current) use of anticoagulants: Secondary | ICD-10-CM | POA: Insufficient documentation

## 2011-08-12 DIAGNOSIS — E119 Type 2 diabetes mellitus without complications: Secondary | ICD-10-CM | POA: Insufficient documentation

## 2011-08-12 DIAGNOSIS — M67919 Unspecified disorder of synovium and tendon, unspecified shoulder: Secondary | ICD-10-CM | POA: Insufficient documentation

## 2011-08-12 DIAGNOSIS — M719 Bursopathy, unspecified: Secondary | ICD-10-CM | POA: Insufficient documentation

## 2011-08-12 DIAGNOSIS — E785 Hyperlipidemia, unspecified: Secondary | ICD-10-CM | POA: Insufficient documentation

## 2011-08-12 DIAGNOSIS — M25579 Pain in unspecified ankle and joints of unspecified foot: Secondary | ICD-10-CM

## 2011-08-12 MED ORDER — IBUPROFEN 800 MG PO TABS
800.0000 mg | ORAL_TABLET | Freq: Once | ORAL | Status: AC
  Administered 2011-08-12: 800 mg via ORAL
  Filled 2011-08-12: qty 1

## 2011-08-12 MED ORDER — OXYCODONE-ACETAMINOPHEN 5-325 MG PO TABS: 1.0000 | ORAL_TABLET | ORAL | Status: AC | PRN

## 2011-08-12 NOTE — Discharge Instructions (Signed)
We can change the pain meds to percocet if the vicodin is not working.  Do not drive with this medication.  Continue the prednisone but skip a dose because we gave you a dose of ibuprofen in the ER.  Follow up with the orthopedic listed below if not better in several days.    Bursitis Bursitis is a swelling and soreness (inflammation) of a fluid-filled sac (bursa) that overlies and protects a joint. It can be caused by injury, overuse of the joint, arthritis or infection. The joints most likely to be affected are the elbows, shoulders, hips and knees. HOME CARE INSTRUCTIONS   Apply ice to the affected area for 15 to 20 minutes each hour while awake for 2 days. Put the ice in a plastic bag and place a towel between the bag of ice and your skin.   Rest the injured joint as much as possible, but continue to put the joint through a full range of motion, 4 times per day. (The shoulder joint especially becomes rapidly "frozen" if not used.) When the pain lessens, begin normal slow movements and usual activities.   Only take over-the-counter or prescription medicines for pain, discomfort or fever as directed by your caregiver.   Your caregiver may recommend draining the bursa and injecting medicine into the bursa. This may help the healing process.   Follow all instructions for follow-up with your caregiver. This includes any orthopedic referrals, physical therapy and rehabilitation. Any delay in obtaining necessary care could result in a delay or failure of the bursitis to heal and chronic pain.  SEEK IMMEDIATE MEDICAL CARE IF:   Your pain increases even during treatment.   You develop an oral temperature above 102 F (38.9 C) and have heat and inflammation over the involved bursa.  MAKE SURE YOU:   Understand these instructions.   Will watch your condition.   Will get help right away if you are not doing well or get worse.  Document Released: 02/14/2000 Document Revised: 02/05/2011 Document  Reviewed: 01/18/2009 [facility] Patient Information 2012 [place].Bursitis Bursitis is a swelling and soreness (inflammation) of a fluid-filled sac (bursa) that overlies and protects a joint. It can be caused by injury, overuse of the joint, arthritis or infection. The joints most likely to be affected are the elbows, shoulders, hips and knees. HOME CARE INSTRUCTIONS   Apply ice to the affected area for 15 to 20 minutes each hour while awake for 2 days. Put the ice in a plastic bag and place a towel between the bag of ice and your skin.   Rest the injured joint as much as possible, but continue to put the joint through a full range of motion, 4 times per day. (The shoulder joint especially becomes rapidly "frozen" if not used.) When the pain lessens, begin normal slow movements and usual activities.   Only take over-the-counter or prescription medicines for pain, discomfort or fever as directed by your caregiver.   Your caregiver may recommend draining the bursa and injecting medicine into the bursa. This may help the healing process.   Follow all instructions for follow-up with your caregiver. This includes any orthopedic referrals, physical therapy and rehabilitation. Any delay in obtaining necessary care could result in a delay or failure of the bursitis to heal and chronic pain.  SEEK IMMEDIATE MEDICAL CARE IF:   Your pain increases even during treatment.   You develop an oral temperature above 102 F (38.9 C) and have heat and inflammation over the  involved bursa.  MAKE SURE YOU:   Understand these instructions.   Will watch your condition.   Will get help right away if you are not doing well or get worse.  Document Released: 02/14/2000 Document Revised: 02/05/2011 Document Reviewed: 01/18/2009 [facility] Patient Information 2012 [place].

## 2011-08-12 NOTE — ED Provider Notes (Signed)
History     CSN: [id number]  Arrival date & time 08/12/11  1342   First MD Initiated Contact with Patient 08/12/11 1440      Chief Complaint  Patient presents with  . Shoulder Pain  . Ankle Pain    left    (Consider location/radiation/quality/duration/timing/severity/associated sxs/prior treatment) Patient is a 71 y.o. female presenting with shoulder pain and ankle pain. The history is provided by the patient and the spouse. No language interpreter was used.  Shoulder Pain This is a recurrent problem. The current episode started 1 to 4 weeks ago. The problem occurs daily. The problem has been unchanged. Associated symptoms include arthralgias. Pertinent negatives include no chest pain, fever, nausea, numbness, vomiting or weakness.  Ankle Pain  The incident occurred 3 to 5 hours ago. The incident occurred at home. The injury mechanism was torsion. The pain is present in the left ankle. The quality of the pain is described as throbbing and aching. The pain is at a severity of 6/10. The pain is moderate. The pain has been constant since onset. Pertinent negatives include no numbness, no inability to bear weight, no loss of motion, no muscle weakness, no loss of sensation and no tingling. The treatment provided mild relief.  71 yo f with Chronic L shoulder pain x 2 weeks and new L ankle pain. Patient has been taking vicodin and prednisone for R shoulder pain with no relief.  Twisted L ankle this am with swelling and tenderness.  Initial R shoulder x-ray negative for fx.  Dr. Stark Jock saw patient on the 7th.  Previous surgery to R shoulder.  IDDM on coumadin.  Past Medical History  Diagnosis Date  . Blindness of left eye   . Eye muscle weakness     Right eye weakness after cataract surgery  . Common migraine     ?hx of  . DIABETES MELLITUS, TYPE II 03/27/2006  . HYPERLIPIDEMIA 03/27/2006  . 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 HYPERTENSION 03/27/2006  . Atrial fibrillation     SVT dx  2007, cath 2007 mild CAD, then had an cardioversion, ablation; still on coumadin , has occ palpitation, EKG 03-2010 NSR  . Headache 05/07/2009  . LUNG NODULE 09/01/2006    Excision, Bx Benighn  . Osteopenia 2004    Dexa 2004 showed Osteopenia, DEXA 03/2007 normal  . Seizures     Past Surgical History  Procedure Date  . Shoulder surgery     Family History  Problem Relation Age of Onset  . Diabetes Father   . Heart attack Father 67  . Diabetes Sister   . Cancer Neg Hx     no hx of colon or breast cancer    History  Substance Use Topics  . Smoking status: Former Smoker    Quit date: 03/02/1978  . Smokeless tobacco: Not on file  . Alcohol Use: No    OB History    Grav Para Term Preterm Abortions TAB SAB Ect Mult Living                  Review of Systems  Constitutional: Negative.  Negative for fever.  HENT: Negative.   Eyes: Negative.   Respiratory: Negative.   Cardiovascular: Negative.  Negative for chest pain.  Gastrointestinal: Negative.  Negative for nausea and vomiting.  Musculoskeletal: Positive for arthralgias.       R shoulder l ankle pain  Neurological: Negative.  Negative for tingling, weakness and numbness.  Psychiatric/Behavioral: Negative.   All  other systems reviewed and are negative.    Allergies  Procaine hcl  Home Medications   Current Outpatient Rx  Name Route Sig Dispense Refill  . ACETAMINOPHEN 325 MG PO TABS Oral Take 2 tablets (650 mg total) by mouth every 6 (six) hours as needed (or Fever >/= 101).    . ASPIRIN EC 81 MG PO TBEC Oral Take 81 mg by mouth daily.    [name] CALCIUM CARBONATE-VITAMIN D 500-200 MG-UNIT PO TABS Oral Take 1 tablet by mouth daily.    [name] DILTIAZEM HCL ER COATED BEADS 240 MG PO CP24 Oral Take 1 capsule (240 mg total) by mouth daily. 30 capsule 5  . HYDROCODONE-ACETAMINOPHEN 5-500 MG PO TABS Oral Take 1-2 tablets by mouth every 6 (six) hours as needed for pain. 20 tablet 0  . INSULIN ISOPHANE HUMAN 100 UNIT/ML [place] SUSP  Subcutaneous Inject 40 Units into the skin daily before breakfast.     . METOPROLOL TARTRATE 25 MG PO TABS Oral Take 0.5 tablets (12.5 mg total) by mouth 2 (two) times daily. 60 tablet 12  . ONE-DAILY MULTI VITAMINS PO TABS Oral Take 1 tablet by mouth daily.      [name] PHENYTOIN 50 MG PO CHEW Oral Chew 3 tablets (150 mg total) by mouth 3 (three) times daily. 90 tablet 0  . PRAVASTATIN SODIUM 40 MG PO TABS Oral Take 1 tablet (40 mg total) by mouth daily. 30 tablet 12  . PREDNISONE 10 MG PO TABS Oral Take 2 tablets (20 mg total) by mouth 2 (two) times daily. 20 tablet 0  . WARFARIN SODIUM 5 MG PO TABS Oral Take 2.5-5 mg by mouth daily. Take 0.5 tablet daily, except take 1 whole tablet on Wednesdays and Fridays.      BP 110/52  Pulse 72  Temp 97.4 F (36.3 C) (Oral)  Resp 18  SpO2 98%  Physical Exam  Nursing note and vitals reviewed. Constitutional: She is oriented to person, place, and time. She appears well-developed and well-nourished.  HENT:  Head: Normocephalic.  Eyes: Conjunctivae and EOM are normal. Pupils are equal, round, and reactive to light.  Neck: Normal range of motion. Neck supple.  Cardiovascular: Normal rate.   Pulmonary/Chest: Effort normal.  Abdominal: Soft.  Musculoskeletal: She exhibits edema and tenderness.       R shoulder tenderness to the R posterior bursa L ankle tenderness to the R lateral ankle with swelling no deformity.   Neurological: She is alert and oriented to person, place, and time.  Skin: Skin is warm and dry.  Psychiatric: She has a normal mood and affect.    ED Course  Procedures (including critical care time)  Labs Reviewed - No data to display Dg Shoulder Right  08/12/2011  *RADIOLOGY REPORT*  Clinical Data: Status post fall.  History of prior humerus fracture.  RIGHT SHOULDER - 2+ VIEW  Comparison: Plain films right shoulder 08/10/2007.  Findings: Surgical neck fracture seen on the comparison study has healed.  There is no acute bony or joint  abnormality.  Degenerative change is present about the acromioclavicular and glenohumeral joints.  Remote right sixth rib fracture noted.  IMPRESSION: No acute finding.  Old surgical neck and right sixth rib fractures noted.  Original Report Authenticated By: Arvid Right. D'ALESSIO, M.D.   Dg Ankle Complete Left  08/12/2011  *RADIOLOGY REPORT*  Clinical Data: Fall.  Lateral ankle pain.  LEFT ANKLE COMPLETE - 3+ VIEW  Comparison: None.  Findings: There is no acute bony or joint  abnormality.  Small plantar calcaneal spur is noted.  Soft tissue structures show atherosclerosis.  IMPRESSION: No acute finding.  Original Report Authenticated By: Arvid Right. Luther Parody, M.D.     No diagnosis found.    MDM  R shoulder busitis with previous surgery. L ankle sprain.  ASO applied to ankle.  Sling to RUE.  Pain meds changed to percocet.  Follow up with ortho this week.     9am 08/13/11   Called patient at home.  No better. Stop prednisone.  Get inr asap.  Appointment on the 18th.  Will try to move it up.  Blood sugars running 140's with extra insulin.  Get ortho appointment.   Julieta Bellini, NP 08/13/11 1105

## 2011-08-12 NOTE — Progress Notes (Signed)
Orthopedic Tech Progress Note Patient Details:  DINEEN TROJANOWSKI 10-19-1940 [id number]  Ortho Devices Type of Ortho Device: Arm foam sling;ASO Ortho Device/Splint Location: (L ) LE and (R) UE Ortho Device/Splint Interventions: Application   Braulio Bosch 08/12/2011, 3:49 PM

## 2011-08-12 NOTE — ED Notes (Signed)
Pt c/o right shoulder pain x 1 week. Pt seen by Dr and given vicodin and prednisone prescriptions. Pt reports medications ineffective. This morning pt fell and twisted left ankle.

## 2011-08-12 NOTE — ED Notes (Signed)
Pt reports right shoulder pain x 1 week and left ankle pain x 1 day. States "twisted" ankle this am. Swelling noted. Pt c/o right shoulder pain, no known injury- broke x 2 years ago. Full range of motion. Pain meds given last week are no longer helping.

## 2011-08-13 NOTE — ED Provider Notes (Signed)
Medical screening examination/treatment/procedure(s) were performed by non-physician practitioner and as supervising physician I was immediately available for consultation/collaboration.   Ezequiel Essex, MD 08/13/11 [phone]

## 2011-08-14 ENCOUNTER — Ambulatory Visit (INDEPENDENT_AMBULATORY_CARE_PROVIDER_SITE_OTHER): Payer: Medicare Other | Admitting: Pharmacist

## 2011-08-14 ENCOUNTER — Other Ambulatory Visit: Payer: Self-pay | Admitting: Neurology

## 2011-08-14 ENCOUNTER — Ambulatory Visit: Payer: Medicare Other

## 2011-08-14 ENCOUNTER — Ambulatory Visit
Admission: RE | Admit: 2011-08-14 | Discharge: 2011-08-14 | Disposition: A | Discharge status: Home / Self Care | Payer: Medicare Other | Source: Ambulatory Visit | Attending: Neurology | Admitting: Neurology

## 2011-08-14 ENCOUNTER — Other Ambulatory Visit: Payer: Self-pay | Admitting: *Deleted

## 2011-08-14 DIAGNOSIS — I4891 Unspecified atrial fibrillation: Secondary | ICD-10-CM

## 2011-08-14 DIAGNOSIS — Z7901 Long term (current) use of anticoagulants: Secondary | ICD-10-CM

## 2011-08-14 DIAGNOSIS — B004 Herpesviral encephalitis: Secondary | ICD-10-CM

## 2011-08-14 DIAGNOSIS — I4892 Unspecified atrial flutter: Secondary | ICD-10-CM

## 2011-08-14 LAB — PROTIME-INR
INR: >20 ratio (ref 0.8–1.0)
Prothrombin Time: >170 s (ref 10.2–12.4)
Prothrombin Time: >90 seconds — ABNORMAL HIGH (ref 11.6–15.2)

## 2011-08-14 LAB — POCT INR: INR: 6.4

## 2011-08-14 MED ORDER — PHYTONADIONE 5 MG PO TABS: 5.0000 mg | ORAL_TABLET | Freq: Once | ORAL | Status: DC

## 2011-08-14 MED ORDER — GADOBENATE DIMEGLUMINE 529 MG/ML IV SOLN
19.0000 mL | Freq: Once | INTRAVENOUS | Status: AC | PRN
  Administered 2011-08-14: 19 mL via INTRAVENOUS

## 2011-08-14 NOTE — Patient Instructions (Addendum)
Called spoke with pt's husband advised of Dr Sherryl Barters recommendations to take 5mg  Vit K now, hold Coumadin until recheck on Monday at 10:15am.  Advised if bleeding occurs go to ED immediately.  Pt and pt's husband agree. Vit K 5mg  tablet called into [facility] pt's husband aware to pick up and give pt now.

## 2011-08-16 ENCOUNTER — Other Ambulatory Visit: Payer: Medicare Other

## 2011-08-17 ENCOUNTER — Ambulatory Visit (INDEPENDENT_AMBULATORY_CARE_PROVIDER_SITE_OTHER): Payer: Medicare Other | Admitting: Pharmacist

## 2011-08-17 DIAGNOSIS — Z7901 Long term (current) use of anticoagulants: Secondary | ICD-10-CM

## 2011-08-17 DIAGNOSIS — I4891 Unspecified atrial fibrillation: Secondary | ICD-10-CM

## 2011-08-17 DIAGNOSIS — I4892 Unspecified atrial flutter: Secondary | ICD-10-CM

## 2011-08-20 ENCOUNTER — Telehealth: Payer: Self-pay | Admitting: *Deleted

## 2011-08-20 MED ORDER — PHENYTOIN 50 MG PO CHEW: 150.0000 mg | CHEWABLE_TABLET | Freq: Three times a day (TID) | ORAL | Status: DC

## 2011-08-24 ENCOUNTER — Ambulatory Visit (INDEPENDENT_AMBULATORY_CARE_PROVIDER_SITE_OTHER): Payer: Medicare Other | Admitting: *Deleted

## 2011-08-24 DIAGNOSIS — I4892 Unspecified atrial flutter: Secondary | ICD-10-CM

## 2011-08-24 DIAGNOSIS — Z7901 Long term (current) use of anticoagulants: Secondary | ICD-10-CM

## 2011-08-24 DIAGNOSIS — I4891 Unspecified atrial fibrillation: Secondary | ICD-10-CM

## 2011-08-25 NOTE — Telephone Encounter (Signed)
Done

## 2011-08-27 ENCOUNTER — Ambulatory Visit (INDEPENDENT_AMBULATORY_CARE_PROVIDER_SITE_OTHER): Payer: Medicare Other | Admitting: Internal Medicine

## 2011-08-27 ENCOUNTER — Encounter: Payer: Self-pay | Admitting: Internal Medicine

## 2011-08-27 VITALS — BP 128/72 | HR 68 | Temp 98.1°F | Wt 189.0 lb

## 2011-08-27 DIAGNOSIS — M25519 Pain in unspecified shoulder: Secondary | ICD-10-CM

## 2011-08-27 DIAGNOSIS — G9340 Encephalopathy, unspecified: Secondary | ICD-10-CM

## 2011-08-27 MED ORDER — LIDOCAINE 5 % EX PTCH: 1.0000 | MEDICATED_PATCH | CUTANEOUS | Status: AC

## 2011-08-27 NOTE — Patient Instructions (Addendum)
Tylenol  500 mg OTC 2 tabs a day every 8 hours as needed for pain  Used the Lidoderm patch every day We are referring you to orthopedic surgery

## 2011-08-27 NOTE — Assessment & Plan Note (Addendum)
New issue Pain at the posterior aspect of the shoulder, description is not mechanical per se. Symptoms better with tylenol and a topical cream. Plan: Ortho referral noting that this may not be any orthopedic issue Lidoderm patch Tylenol as needed

## 2011-08-27 NOTE — Assessment & Plan Note (Addendum)
Recovering from a episodes of encephalopathy, had a MRI few days ago, results not available to me at this point. Now has tremor in the thumb. Recommend to discuss with neurology.

## 2011-08-27 NOTE — Progress Notes (Signed)
  Subjective:    Patient ID: [name] [name], female    DOB: May 04, 1940, 71 y.o.   MRN: [id number]  HPI Acute visit for shoulder pain. The pain started approximately 2 weeks ago, is almost daily, is located at the posterior side of the shoulder, see graphic. Pain can be severe. Pain does not change with arm motion. Went to the ER twice, chart reviewed---> she was prescribed prednisone and hydrocodone which didn't help. X-ray was done, no acute findings. Sx  decrease with Tylenol and a topical cream over-the-counter. Additionally, has noted some tremor in the right thumb  for the last few days.  Past Medical History:   Diabetes with neuropathy and retinopathy---> worse after B laser surgery (actually blind on the L)   Hyperlipidemia Hypertension Paroxysmal atrial fibrillation---> SVT dx 2007, cath 2007 mild CAD, then had an cardioversion, ablation; still on coumadin , has occ palpitation, EKG 03-2010 NSR   Lung nodule excision 2008, pathology benign. ? Osteopenia  DEXA 2004 showed osteopenia, DEXA 03-2007 normal   ? 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 No recent shoulder injury.    Objective:   Physical Exam  Constitutional: She appears well-developed. No distress.  Neck: Normal range of motion.       Not tender to palpation  Musculoskeletal:       Arms: Neurological:       Arm range of motion is normal, DTRs symmetric, slightly decreased throughout. Strength symmetric. Does have a tremor at the right thumb  Skin: She is not diaphoretic.       No rash in the  skin          Assessment & Plan:

## 2011-09-11 ENCOUNTER — Encounter (HOSPITAL_COMMUNITY): Payer: Self-pay | Admitting: Emergency Medicine

## 2011-09-11 ENCOUNTER — Emergency Department (HOSPITAL_COMMUNITY): Payer: Medicare Other

## 2011-09-11 ENCOUNTER — Emergency Department (HOSPITAL_COMMUNITY)
Admission: EM | Admit: 2011-09-11 | Discharge: 2011-09-11 | Disposition: A | Discharge status: Home / Self Care | Payer: Medicare Other | Attending: Emergency Medicine | Admitting: Emergency Medicine

## 2011-09-11 DIAGNOSIS — Z8249 Family history of ischemic heart disease and other diseases of the circulatory system: Secondary | ICD-10-CM | POA: Insufficient documentation

## 2011-09-11 DIAGNOSIS — Z87891 Personal history of nicotine dependence: Secondary | ICD-10-CM | POA: Insufficient documentation

## 2011-09-11 DIAGNOSIS — I10 Essential (primary) hypertension: Secondary | ICD-10-CM | POA: Insufficient documentation

## 2011-09-11 DIAGNOSIS — E119 Type 2 diabetes mellitus without complications: Secondary | ICD-10-CM | POA: Insufficient documentation

## 2011-09-11 DIAGNOSIS — M25469 Effusion, unspecified knee: Secondary | ICD-10-CM

## 2011-09-11 DIAGNOSIS — Z833 Family history of diabetes mellitus: Secondary | ICD-10-CM | POA: Insufficient documentation

## 2011-09-11 DIAGNOSIS — M25569 Pain in unspecified knee: Secondary | ICD-10-CM | POA: Insufficient documentation

## 2011-09-11 DIAGNOSIS — I4891 Unspecified atrial fibrillation: Secondary | ICD-10-CM | POA: Insufficient documentation

## 2011-09-11 DIAGNOSIS — M899 Disorder of bone, unspecified: Secondary | ICD-10-CM | POA: Insufficient documentation

## 2011-09-11 DIAGNOSIS — E785 Hyperlipidemia, unspecified: Secondary | ICD-10-CM | POA: Insufficient documentation

## 2011-09-11 DIAGNOSIS — R569 Unspecified convulsions: Secondary | ICD-10-CM | POA: Insufficient documentation

## 2011-09-11 DIAGNOSIS — M949 Disorder of cartilage, unspecified: Secondary | ICD-10-CM | POA: Insufficient documentation

## 2011-09-11 MED ORDER — HYDROCODONE-ACETAMINOPHEN 5-325 MG PO TABS: 1.0000 | ORAL_TABLET | Freq: Four times a day (QID) | ORAL | Status: AC | PRN

## 2011-09-11 MED ORDER — HYDROCODONE-ACETAMINOPHEN 5-325 MG PO TABS
1.0000 | ORAL_TABLET | Freq: Once | ORAL | Status: AC
  Administered 2011-09-11: 1 via ORAL
  Filled 2011-09-11: qty 1

## 2011-09-11 NOTE — ED Provider Notes (Signed)
Medical screening examination/treatment/procedure(s) were conducted as a shared visit with non-physician practitioner(s) and myself.  I personally evaluated the patient during the encounter  Tender right knee without deformity. Xray results discussed with Dr. Radene Knee of Radiology.  Wynetta Fines, MD 09/11/11 1753

## 2011-09-11 NOTE — ED Provider Notes (Signed)
History     CSN: [id number]  Arrival date & time 09/11/11  0408   First MD Initiated Contact with Patient 09/11/11 [phone]      Chief Complaint  Patient presents with  . Knee Pain    (Consider location/radiation/quality/duration/timing/severity/associated sxs/prior treatment) HPI Patient, states that she has had right knee pain over the last 2 days.  She states that the pain has gotten worse since yesterday.  Patient denies any injury to her knee.  Patient, states that she has had no numbness or weakness below the knee.  Patient, states the knee is swollen and tender to the touch.  She states that walking makes the pain, worse. Past Medical History  Diagnosis Date  . Blindness of left eye   . Eye muscle weakness     Right eye weakness after cataract surgery  . Common migraine     ?hx of  . DIABETES MELLITUS, TYPE II 03/27/2006  . HYPERLIPIDEMIA 03/27/2006  . 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 HYPERTENSION 03/27/2006  . Atrial fibrillation     SVT dx 2007, cath 2007 mild CAD, then had an cardioversion, ablation; still on coumadin , has occ palpitation, EKG 03-2010 NSR  . Headache 05/07/2009  . LUNG NODULE 09/01/2006    Excision, Bx Benighn  . Osteopenia 2004    Dexa 2004 showed Osteopenia, DEXA 03/2007 normal  . Seizures     Past Surgical History  Procedure Date  . Shoulder surgery     Family History  Problem Relation Age of Onset  . Diabetes Father   . Heart attack Father 41  . Diabetes Sister   . Cancer Neg Hx     no hx of colon or breast cancer    History  Substance Use Topics  . Smoking status: Former Smoker    Quit date: 03/02/1978  . Smokeless tobacco: Not on file  . Alcohol Use: No    OB History    Grav Para Term Preterm Abortions TAB SAB Ect Mult Living                  Review of Systems All other systems negative except as documented in the HPI. All pertinent positives and negatives as reviewed in the HPI.  Allergies  Procaine hcl  Home  Medications   Current Outpatient Rx  Name Route Sig Dispense Refill  . ASPIRIN EC 81 MG PO TBEC Oral Take 81 mg by mouth daily.    [name] CALCIUM CARBONATE-VITAMIN D 500-200 MG-UNIT PO TABS Oral Take 1 tablet by mouth daily.    [name] DILTIAZEM HCL ER COATED BEADS 240 MG PO CP24 Oral Take 1 capsule (240 mg total) by mouth daily. 30 capsule 5  . INSULIN ISOPHANE HUMAN 100 UNIT/ML [place] SUSP Subcutaneous Inject 5-40 Units into the skin daily before breakfast. 40 units in the morning at breakfast. 5-10 units at night depending on blood sugar levels    . LIDOCAINE 5 % EX PTCH Transdermal Place 1 patch onto the skin daily. Remove & Discard patch within 12 hours or as directed by MD 30 patch 0  . METOPROLOL TARTRATE 25 MG PO TABS Oral Take 0.5 tablets (12.5 mg total) by mouth 2 (two) times daily. 60 tablet 12  . ONE-DAILY MULTI VITAMINS PO TABS Oral Take 1 tablet by mouth daily.      [name] PHENYTOIN 50 MG PO CHEW Oral Chew 3 tablets (150 mg total) by mouth 3 (three) times daily. 270 tablet 3  . PRAVASTATIN SODIUM  40 MG PO TABS Oral Take 1 tablet (40 mg total) by mouth daily. 30 tablet 12  . WARFARIN SODIUM 5 MG PO TABS Oral Take 2.5-5 mg by mouth daily. 2.5 mg daily, except Friday is 5 mg. INR check due Monday 09/14/2011    . ACETAMINOPHEN 325 MG PO TABS Oral Take 2 tablets (650 mg total) by mouth every 6 (six) hours as needed (or Fever >/= 101).      BP 128/46  Temp 98.3 F (36.8 C) (Oral)  Resp 18  SpO2 100%  Physical Exam  Constitutional: She appears well-developed and well-nourished. No distress.  HENT:  Head: Normocephalic and atraumatic.  Musculoskeletal:       Right knee: She exhibits swelling and effusion. She exhibits normal patellar mobility. tenderness found.  Skin: Skin is warm and dry.    ED Course  Procedures (including critical care time)  Labs Reviewed - No data to display Dg Knee Complete 4 Views Right  09/11/2011  *RADIOLOGY REPORT*  Clinical Data: Right knee pain and swelling.  RIGHT  KNEE - COMPLETE 4+ VIEW  Comparison: Right knee radiograph performed 12/16/2010  Findings: There was an apparent mildly displaced posterior tibial plateau fracture on the prior knee radiographs, with callus formation noted on the current study.  The fracture line is still evident.  No new fractures are seen.  A moderate joint effusion is seen.  Diffuse vascular calcifications are seen.  IMPRESSION:  1.   Mildly displaced posterior tibial plateau fracture noted, first noted in October 2012.  Callus formation is noted on the current study; the fracture line remains evident.  No new fractures seen. 2.  Moderate joint effusion noted. 3.  Diffuse vascular calcifications seen.  Original Report Authenticated By: Santa Lighter, M.D.     Patient will be placed in knee immobilizer and, advised she will need followup with orthopedics.  I also advised her, that we will give her pain control for home. I advised her to use ice and elevation on the knee. The patient has a small effusion noted on exam.  [place], PA-C 09/11/11 [phone]

## 2011-09-11 NOTE — ED Notes (Signed)
Patient with pain in right knee.  No injury to right knee.

## 2011-09-15 ENCOUNTER — Ambulatory Visit
Admission: RE | Admit: 2011-09-15 | Discharge: 2011-09-15 | Disposition: A | Discharge status: Home / Self Care | Payer: Medicare Other | Source: Ambulatory Visit | Attending: Family Medicine | Admitting: Family Medicine

## 2011-09-15 ENCOUNTER — Other Ambulatory Visit: Payer: Self-pay | Admitting: Family Medicine

## 2011-09-15 DIAGNOSIS — IMO0002 Reserved for concepts with insufficient information to code with codable children: Secondary | ICD-10-CM

## 2011-09-15 MED ORDER — IOHEXOL 350 MG/ML SOLN
125.0000 mL | Freq: Once | INTRAVENOUS | Status: AC | PRN
  Administered 2011-09-15: 125 mL via INTRAVENOUS

## 2011-09-16 ENCOUNTER — Other Ambulatory Visit: Payer: Self-pay

## 2011-09-16 MED ORDER — INSULIN NPH (HUMAN) (ISOPHANE) 100 UNIT/ML ~~LOC~~ SUSP: 5.0000 [IU] | Freq: Every day | SUBCUTANEOUS | Status: DC

## 2011-09-16 NOTE — Progress Notes (Signed)
On 09/15/11 Pt's record was documented as starting an IV and receiving IV contrast.  This was an error and attached to the wrong patients chart.  Curtis Sites, [place] 09/16/11

## 2011-09-16 NOTE — Addendum Note (Signed)
Addended by: Lyman Bishop on: 09/16/2011 01:24 PM   Modules accepted: Orders

## 2011-09-21 ENCOUNTER — Ambulatory Visit (INDEPENDENT_AMBULATORY_CARE_PROVIDER_SITE_OTHER): Payer: Medicare Other | Admitting: *Deleted

## 2011-09-21 DIAGNOSIS — Z7901 Long term (current) use of anticoagulants: Secondary | ICD-10-CM

## 2011-09-21 DIAGNOSIS — I4891 Unspecified atrial fibrillation: Secondary | ICD-10-CM

## 2011-09-21 DIAGNOSIS — I4892 Unspecified atrial flutter: Secondary | ICD-10-CM

## 2011-09-21 LAB — PROTIME-INR: INR: 6.2 ratio (ref 0.8–1.0)

## 2011-09-21 MED ORDER — WARFARIN SODIUM 2.5 MG PO TABS: 2.5000 mg | ORAL_TABLET | Freq: Every day | ORAL | Status: DC

## 2011-09-28 ENCOUNTER — Ambulatory Visit (INDEPENDENT_AMBULATORY_CARE_PROVIDER_SITE_OTHER): Payer: Medicare Other | Admitting: *Deleted

## 2011-09-28 DIAGNOSIS — I4892 Unspecified atrial flutter: Secondary | ICD-10-CM

## 2011-09-28 DIAGNOSIS — Z7901 Long term (current) use of anticoagulants: Secondary | ICD-10-CM

## 2011-09-28 DIAGNOSIS — I4891 Unspecified atrial fibrillation: Secondary | ICD-10-CM

## 2011-10-05 ENCOUNTER — Ambulatory Visit (INDEPENDENT_AMBULATORY_CARE_PROVIDER_SITE_OTHER): Payer: Medicare Other | Admitting: Internal Medicine

## 2011-10-05 ENCOUNTER — Encounter: Payer: Self-pay | Admitting: Internal Medicine

## 2011-10-05 VITALS — BP 140/72 | HR 56 | Temp 97.9°F | Wt 195.0 lb

## 2011-10-05 DIAGNOSIS — E119 Type 2 diabetes mellitus without complications: Secondary | ICD-10-CM

## 2011-10-05 DIAGNOSIS — K219 Gastro-esophageal reflux disease without esophagitis: Secondary | ICD-10-CM

## 2011-10-05 DIAGNOSIS — I10 Essential (primary) hypertension: Secondary | ICD-10-CM

## 2011-10-05 DIAGNOSIS — E785 Hyperlipidemia, unspecified: Secondary | ICD-10-CM

## 2011-10-05 HISTORY — DX: Gastro-esophageal reflux disease without esophagitis: K21.9

## 2011-10-05 MED ORDER — RANITIDINE HCL 300 MG PO TABS: 300.0000 mg | ORAL_TABLET | Freq: Every day | ORAL | Status: DC

## 2011-10-05 NOTE — Assessment & Plan Note (Signed)
Had heartburn for the last few months, because she takes Dilantin I'll avoid PPIs for now. Zantac is a good option but it could affect Dilantin and Coumadin. Plan: Start ranitidine Dilantin level in 3 weeks We'll see the Coumadin clinic tomorrow I encouraged her to discuss with them the fact that she's taking zantac

## 2011-10-05 NOTE — Assessment & Plan Note (Signed)
Per endocrinology, will call and make an appointment soon

## 2011-10-05 NOTE — Assessment & Plan Note (Addendum)
BP well controlled. she reports "feeling cold from the waist up on and off". Will check a TSH, if not better needs will make further eval

## 2011-10-05 NOTE — Progress Notes (Signed)
  Subjective:    Patient ID: [name] [name], female    DOB: Sep 06, 1940, 71 y.o.   MRN: [id number]  HPI Routine office visit Shoulder pain, was seen with that last time, pain still there but not as intense. Diabetes, still needs to see Dr. [name] History of encephalopathy, recovering slowly Since he left the hospital several months ago she is having classic heartburn, has not taken any medication for that. Symptoms are usually at night.  Past Medical History:   Diabetes with neuropathy and retinopathy---> worse after B laser surgery (actually blind on the L)   Hyperlipidemia Hypertension Paroxysmal atrial fibrillation---> SVT dx 2007, cath 2007 mild CAD, then had an cardioversion, ablation; still on coumadin , has occ palpitation, EKG 03-2010 NSR   Lung nodule excision 2008, pathology benign. ? Osteopenia  DEXA 2004 showed osteopenia, DEXA 03-2007 normal   ? 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 Denies any chest pain, shortness of breath, blood in the stools. Had a problem with right knee pain, saw orthopedic surgery. No dysuria or gross hematuria Complained of feeling cold, "from the waist up"; no fever chills    Objective:   Physical Exam  General -- alert, well-developed Lungs -- normal respiratory effort, no intercostal retractions, no accessory muscle use, and normal breath sounds.   Heart-- normal rate, regular rhythm, no murmur, and no gallop.     Extremities--  has a right knee brace Neurologic-- alert & oriented X3 and strength normal in all extremities. Psych-- Cognition and judgment appear intact. Alert and cooperative with normal attention span and concentration.  not anxious appearing and not depressed appearing.      Assessment & Plan:

## 2011-10-05 NOTE — Assessment & Plan Note (Signed)
Not fasting today, we'll check FLP on return to the office

## 2011-10-05 NOTE — Patient Instructions (Addendum)
Schedule a dilantin level in 3 weeks  Dx seizure Come back by mid December for a physical, fasting

## 2011-10-06 ENCOUNTER — Ambulatory Visit (INDEPENDENT_AMBULATORY_CARE_PROVIDER_SITE_OTHER): Payer: Medicare Other | Admitting: Pharmacist

## 2011-10-06 ENCOUNTER — Encounter: Payer: Self-pay | Admitting: *Deleted

## 2011-10-06 DIAGNOSIS — I4891 Unspecified atrial fibrillation: Secondary | ICD-10-CM

## 2011-10-06 DIAGNOSIS — I4892 Unspecified atrial flutter: Secondary | ICD-10-CM

## 2011-10-06 DIAGNOSIS — Z7901 Long term (current) use of anticoagulants: Secondary | ICD-10-CM

## 2011-10-19 ENCOUNTER — Telehealth: Payer: Self-pay | Admitting: Internal Medicine

## 2011-10-19 NOTE — Telephone Encounter (Signed)
Pt  & husband both called stated Dilantin cost patient $103.00 for 3-pills. Per Pharmacist to husband Jenny Reichmann, can fill with a generic capsule called  PHENYTONEX 100MG   Please review CB# [phone]

## 2011-10-19 NOTE — Telephone Encounter (Signed)
Pharmacy given verbal ok to fill for generic. Pt husband notified of the change.

## 2011-10-19 NOTE — Telephone Encounter (Signed)
please call back regarding questions that he has as to the documentation on how she is taking the medication Cb# cell [phone]

## 2011-10-19 NOTE — Telephone Encounter (Signed)
Left message to call office

## 2011-10-19 NOTE — Telephone Encounter (Signed)
Can absolutely fill w/ generic!

## 2011-10-20 ENCOUNTER — Encounter: Payer: Self-pay | Admitting: Endocrinology

## 2011-10-20 ENCOUNTER — Other Ambulatory Visit (INDEPENDENT_AMBULATORY_CARE_PROVIDER_SITE_OTHER): Payer: Medicare Other

## 2011-10-20 ENCOUNTER — Ambulatory Visit (INDEPENDENT_AMBULATORY_CARE_PROVIDER_SITE_OTHER): Payer: Medicare Other | Admitting: Endocrinology

## 2011-10-20 ENCOUNTER — Ambulatory Visit (INDEPENDENT_AMBULATORY_CARE_PROVIDER_SITE_OTHER): Payer: Medicare Other | Admitting: Pharmacist

## 2011-10-20 VITALS — BP 114/58 | HR 67 | Temp 97.6°F | Ht 69.0 in | Wt 192.0 lb

## 2011-10-20 DIAGNOSIS — E119 Type 2 diabetes mellitus without complications: Secondary | ICD-10-CM

## 2011-10-20 DIAGNOSIS — Z7901 Long term (current) use of anticoagulants: Secondary | ICD-10-CM

## 2011-10-20 DIAGNOSIS — I4892 Unspecified atrial flutter: Secondary | ICD-10-CM

## 2011-10-20 DIAGNOSIS — I4891 Unspecified atrial fibrillation: Secondary | ICD-10-CM

## 2011-10-20 LAB — HEMOGLOBIN A1C: Hgb A1c MFr Bld: 7.1 % — ABNORMAL HIGH (ref 4.6–6.5)

## 2011-10-20 LAB — POCT INR: INR: 2.1

## 2011-10-20 NOTE — Progress Notes (Signed)
Subjective:    Patient ID: [name] [name], female    DOB: 05-25-1940, 70 y.o.   MRN: [id number]  HPI Pt returns for f/u of insulin-requiring DM (dx'ed 0000000; complicated by retinopathy and peripheral sensory neuropathy).  no cbg record, but states cbg's are sometimes mildly low in the morning.  This happens when she takes extra insulin if cbg is high (high-100's or low-200's) in the evening (approx twice a week).   Past Medical History  Diagnosis Date  . Blindness of left eye   . Eye muscle weakness     Right eye weakness after cataract surgery  . Common migraine     ?hx of  . DIABETES MELLITUS, TYPE II 03/27/2006  . HYPERLIPIDEMIA 03/27/2006  . 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 HYPERTENSION 03/27/2006  . Atrial fibrillation     SVT dx 2007, cath 2007 mild CAD, then had an cardioversion, ablation; still on coumadin , has occ palpitation, EKG 03-2010 NSR  . Headache 05/07/2009  . LUNG NODULE 09/01/2006    Excision, Bx Benighn  . Osteopenia 2004    Dexa 2004 showed Osteopenia, DEXA 03/2007 normal  . Seizures     Past Surgical History  Procedure Date  . Shoulder surgery     History   Social History  . Marital Status: Married    Spouse Name: N/A    Number of Children: 1  . Years of Education: N/A   Occupational History  .     Social History Main Topics  . Smoking status: Former Smoker    Quit date: 03/02/1978  . Smokeless tobacco: Not on file  . Alcohol Use: No  . Drug Use: No  . Sexually Active: No   Other Topics Concern  . Not on file   Social History Narrative   Moved from [place] in the 80's    Current Outpatient Prescriptions on File Prior to Visit  Medication Sig Dispense Refill  . acetaminophen (TYLENOL) 325 MG tablet Take 2 tablets (650 mg total) by mouth every 6 (six) hours as needed (or Fever >/= 101).      [name] aspirin EC 81 MG tablet Take 81 mg by mouth daily.      . calcium-vitamin D (OSCAL WITH D) 500-200 MG-UNIT per tablet Take 1  tablet by mouth daily.      [name] diltiazem (CARDIZEM CD) 240 MG 24 hr capsule Take 1 capsule (240 mg total) by mouth daily.  30 capsule  5  . insulin NPH (HUMULIN N,NOVOLIN N) 100 UNIT/ML injection Inject 40 Units into the skin every morning.      . metoprolol tartrate (LOPRESSOR) 25 MG tablet Take 0.5 tablets (12.5 mg total) by mouth 2 (two) times daily.  60 tablet  12  . Multiple Vitamin (MULTIVITAMIN) tablet Take 1 tablet by mouth daily.        . phenytoin (DILANTIN) 50 MG tablet Chew 3 tablets (150 mg total) by mouth 3 (three) times daily.  270 tablet  3  . pravastatin (PRAVACHOL) 40 MG tablet Take 1 tablet (40 mg total) by mouth daily.  30 tablet  12  . ranitidine (ZANTAC) 300 MG tablet Take 1 tablet (300 mg total) by mouth at bedtime.  30 tablet  3  . warfarin (COUMADIN) 2.5 MG tablet Take 1 tablet (2.5 mg total) by mouth daily.  30 tablet  3    Allergies  Allergen Reactions  . Procaine Hcl Anaphylaxis    Family History  Problem Relation Age of  Onset  . Diabetes Father   . Heart attack Father 55  . Diabetes Sister   . Cancer Neg Hx     no hx of colon or breast cancer    BP 114/58  Pulse 67  Temp 97.6 F (36.4 C) (Oral)  Ht 5\' 9"  (1.753 m)  Wt 192 lb (87.091 kg)  BMI 28.35 kg/m2  SpO2 96%    Review of Systems Denies LOC    Objective:   Physical Exam VITAL SIGNS:  See vs page GENERAL: no distress Pulses: dorsalis pedis intact bilat.   Feet: no deformity.  no ulcer on the feet.  feet are of normal color and temp.  no edema.  There is bilateral onychomycosis Neuro: sensation is intact to touch on the feet, but decreased from normal  Lab Results  Component Value Date   HGBA1C 7.1* 10/20/2011      Assessment & Plan:  DM.  this is the best control this pt should aim for, given this regimen, which does match insulin to her changing needs throughout the day.

## 2011-10-20 NOTE — Patient Instructions (Addendum)
please take NPH insulin, 40 units each morning, and none in the evening  Please reuturn here in 3 months.   check your blood sugar 2 times a day.  vary the time of day when you check, between before the 3 meals, and at bedtime.  also check if you have symptoms of your blood sugar being too high or too low.  please keep a record of the readings and bring it to your next appointment here.  please call us sooner if you are having low blood sugar episodes.

## 2011-10-21 ENCOUNTER — Other Ambulatory Visit: Payer: Self-pay | Admitting: Internal Medicine

## 2011-10-21 ENCOUNTER — Telehealth: Payer: Self-pay | Admitting: *Deleted

## 2011-10-21 NOTE — Telephone Encounter (Signed)
Discussed with pharmacy

## 2011-10-21 NOTE — Telephone Encounter (Signed)
Called pt to inform of lab results, pt informed via VM and to callback office with any questions/concerns (letter also mailed to pt).  

## 2011-10-21 NOTE — Telephone Encounter (Signed)
Refill as follows  Phenyton 100mg  #90 1 po tid & Phenyton 50mg  #90 1po tid  Last wrt 6.20.13 #270 wt 3-refills as Phenytoin (Chew Tab) DILANTIN 50 MG Chew 3 tablets (150 mg total) by mouth 3 (three) times daily.  Last ov 8.5.13 61-month follow up  Note on fax Pt wants rx split due to cost

## 2011-10-26 ENCOUNTER — Other Ambulatory Visit: Payer: Medicare Other

## 2011-10-26 DIAGNOSIS — R569 Unspecified convulsions: Secondary | ICD-10-CM

## 2011-10-27 LAB — PHENYTOIN LEVEL, TOTAL: Phenytoin Lvl: 17 ug/mL (ref 10.0–20.0)

## 2011-10-30 ENCOUNTER — Ambulatory Visit: Payer: Medicare Other | Admitting: Internal Medicine

## 2011-11-05 ENCOUNTER — Emergency Department (HOSPITAL_BASED_OUTPATIENT_CLINIC_OR_DEPARTMENT_OTHER): Payer: Medicare Other

## 2011-11-05 ENCOUNTER — Encounter (HOSPITAL_BASED_OUTPATIENT_CLINIC_OR_DEPARTMENT_OTHER): Payer: Self-pay | Admitting: Emergency Medicine

## 2011-11-05 ENCOUNTER — Emergency Department (HOSPITAL_BASED_OUTPATIENT_CLINIC_OR_DEPARTMENT_OTHER)
Admission: EM | Admit: 2011-11-05 | Discharge: 2011-11-05 | Disposition: A | Discharge status: Home / Self Care | Payer: Medicare Other | Attending: Emergency Medicine | Admitting: Emergency Medicine

## 2011-11-05 DIAGNOSIS — Z794 Long term (current) use of insulin: Secondary | ICD-10-CM | POA: Insufficient documentation

## 2011-11-05 DIAGNOSIS — E1169 Type 2 diabetes mellitus with other specified complication: Secondary | ICD-10-CM | POA: Insufficient documentation

## 2011-11-05 DIAGNOSIS — I10 Essential (primary) hypertension: Secondary | ICD-10-CM | POA: Insufficient documentation

## 2011-11-05 DIAGNOSIS — R51 Headache: Secondary | ICD-10-CM | POA: Insufficient documentation

## 2011-11-05 DIAGNOSIS — N390 Urinary tract infection, site not specified: Secondary | ICD-10-CM

## 2011-11-05 DIAGNOSIS — Z7901 Long term (current) use of anticoagulants: Secondary | ICD-10-CM | POA: Insufficient documentation

## 2011-11-05 DIAGNOSIS — H5440 Blindness, one eye, unspecified eye: Secondary | ICD-10-CM | POA: Insufficient documentation

## 2011-11-05 DIAGNOSIS — R42 Dizziness and giddiness: Secondary | ICD-10-CM | POA: Insufficient documentation

## 2011-11-05 DIAGNOSIS — I4891 Unspecified atrial fibrillation: Secondary | ICD-10-CM | POA: Insufficient documentation

## 2011-11-05 DIAGNOSIS — F29 Unspecified psychosis not due to a substance or known physiological condition: Secondary | ICD-10-CM | POA: Insufficient documentation

## 2011-11-05 DIAGNOSIS — Z79899 Other long term (current) drug therapy: Secondary | ICD-10-CM | POA: Insufficient documentation

## 2011-11-05 DIAGNOSIS — R791 Abnormal coagulation profile: Secondary | ICD-10-CM

## 2011-11-05 DIAGNOSIS — E785 Hyperlipidemia, unspecified: Secondary | ICD-10-CM | POA: Insufficient documentation

## 2011-11-05 DIAGNOSIS — E162 Hypoglycemia, unspecified: Secondary | ICD-10-CM

## 2011-11-05 LAB — CBC WITH DIFFERENTIAL/PLATELET
Basophils Absolute: 0 10*3/uL (ref 0.0–0.1)
Lymphocytes Relative: 21 % (ref 12–46)
Lymphs Abs: 1.6 10*3/uL (ref 0.7–4.0)
MCV: 89.3 fL (ref 78.0–100.0)
Neutro Abs: 4.6 10*3/uL (ref 1.7–7.7)
Neutrophils Relative %: 62 % (ref 43–77)
Platelets: 273 10*3/uL (ref 150–400)
RBC: 3.73 MIL/uL — ABNORMAL LOW (ref 3.87–5.11)
RDW: 12.3 % (ref 11.5–15.5)
WBC: 7.4 10*3/uL (ref 4.0–10.5)

## 2011-11-05 LAB — COMPREHENSIVE METABOLIC PANEL
ALT: 17 U/L (ref 0–35)
AST: 21 U/L (ref 0–37)
Alkaline Phosphatase: 130 U/L — ABNORMAL HIGH (ref 39–117)
CO2: 29 mEq/L (ref 19–32)
Chloride: 97 mEq/L (ref 96–112)
GFR calc Af Amer: 58 mL/min — ABNORMAL LOW (ref >90)
GFR calc non Af Amer: 50 mL/min — ABNORMAL LOW (ref >90)
Glucose, Bld: 65 mg/dL — ABNORMAL LOW (ref 70–99)
Potassium: 3.8 mEq/L (ref 3.5–5.1)
Sodium: 136 mEq/L (ref 135–145)

## 2011-11-05 LAB — URINALYSIS, ROUTINE W REFLEX MICROSCOPIC
Bilirubin Urine: NEGATIVE
Glucose, UA: NEGATIVE mg/dL
Hgb urine dipstick: NEGATIVE
Protein, ur: NEGATIVE mg/dL
Urobilinogen, UA: 0.2 mg/dL (ref 0.0–1.0)

## 2011-11-05 LAB — PROTIME-INR
INR: 1.31 (ref 0.00–1.49)
Prothrombin Time: 16.5 seconds — ABNORMAL HIGH (ref 11.6–15.2)

## 2011-11-05 LAB — LIPASE, BLOOD: Lipase: 10 U/L — ABNORMAL LOW (ref 11–59)

## 2011-11-05 LAB — URINE MICROSCOPIC-ADD ON

## 2011-11-05 LAB — GLUCOSE, CAPILLARY: Glucose-Capillary: 73 mg/dL (ref 70–99)

## 2011-11-05 LAB — TROPONIN I: Troponin I: <0.3 ng/mL (ref <0.30)

## 2011-11-05 MED ORDER — CEPHALEXIN 250 MG PO CAPS
500.0000 mg | ORAL_CAPSULE | Freq: Once | ORAL | Status: AC
  Administered 2011-11-05: 500 mg via ORAL
  Filled 2011-11-05: qty 2

## 2011-11-05 MED ORDER — CEPHALEXIN 500 MG PO CAPS: 500.0000 mg | ORAL_CAPSULE | Freq: Four times a day (QID) | ORAL | Status: DC

## 2011-11-05 MED ORDER — WARFARIN SODIUM 5 MG PO TABS
2.5000 mg | ORAL_TABLET | Freq: Once | ORAL | Status: AC
  Administered 2011-11-05: 2.5 mg via ORAL
  Filled 2011-11-05: qty 1

## 2011-11-05 MED ORDER — INSULIN NPH (HUMAN) (ISOPHANE) 100 UNIT/ML ~~LOC~~ SUSP: 20.0000 [IU] | SUBCUTANEOUS | Status: DC

## 2011-11-05 NOTE — ED Notes (Signed)
Pt to ED via EMS w/ c/o hypoglycemia (cbg 48 per pt); drank juice; cbg 127 per EMS

## 2011-11-06 ENCOUNTER — Telehealth: Payer: Self-pay | Admitting: *Deleted

## 2011-11-06 ENCOUNTER — Telehealth: Payer: Self-pay | Admitting: Endocrinology

## 2011-11-06 ENCOUNTER — Telehealth: Payer: Self-pay | Admitting: Internal Medicine

## 2011-11-06 MED ORDER — INSULIN NPH (HUMAN) (ISOPHANE) 100 UNIT/ML ~~LOC~~ SUSP: 30.0000 [IU] | SUBCUTANEOUS | Status: DC

## 2011-11-06 NOTE — Telephone Encounter (Signed)
Pt's spouse advised, please see previous phone note regarding same.

## 2011-11-06 NOTE — Telephone Encounter (Signed)
Pt's husband informed of insulin adjustment. Appointment scheduled for 11/13/2011.

## 2011-11-06 NOTE — Telephone Encounter (Signed)
Please reduce the insulin to 30 units qam

## 2011-11-06 NOTE — Telephone Encounter (Signed)
Caller: [name]/Spouse; Patient Name: [name] [name]; PCP: Renato Shin (Adults only); [facility] Phone Number: [phone]; Reason for call:  Pt. was seen in ED last eve 11/05/11, with a blood sugar of 47.  The ED advised pt. to take 20 units of Humulin N insulin tomorrow instead of 40.  Pt.s fasting blood sugar this am was 151.  Blood sugar is presently 161.  Dose of Humulin N WAS 20 units.  They are calling to see if they should continue 20units per am or go back to 40 units q am.  Please advise.  Caller denied need for triage as pt. is stable at this time.  CAN/db

## 2011-11-06 NOTE — Telephone Encounter (Signed)
they notify the proper MDs for her Coumadin and blood sugars. I will check the urine culture Monday to be sure she is on the appropriate antibiotic. Will call her then.

## 2011-11-06 NOTE — Telephone Encounter (Signed)
New Problem:   Patient's wife was seen in the ER yesterday and they gave her an extra 2.5mg  of coumadin and now he would like to know what her dose should be going forward.  Please call back.

## 2011-11-06 NOTE — Telephone Encounter (Signed)
Pt went to ED yesterday for decreased blood sugar, INR checked at hospital, on discharge sheet INR documented as 1.8, but actual lab result is 1.31.  Pt was given an extra 2.5mg  of Coumadin yesterday for a total of 3.75mg  yesterday.  Pt was started on Keflex for a UTI.  Advised pt's husband to keep f/u appt scheduled for 11/10/11, and continue on same dosage of Coumadin.

## 2011-11-06 NOTE — Telephone Encounter (Signed)
Please advise 

## 2011-11-06 NOTE — Telephone Encounter (Signed)
Discussed with pt

## 2011-11-06 NOTE — ED Provider Notes (Signed)
History     CSN: [id number]  Arrival date & time 11/05/11  1825   First MD Initiated Contact with Patient 11/05/11 1833      Chief Complaint  Patient presents with  . Hypoglycemia    (Consider location/radiation/quality/duration/timing/severity/associated sxs/prior treatment) HPI Patient is a 71 year old female with history of multiple medical problems including type 2 diabetes as well as atrial fibrillation anticoagulated with Coumadin with recent ablation who presents today for evaluation of hypoglycemia. Patient reports that she's had many medication changes recently. She's followed by Dr. [name] for endocrinology, Dr. Lovena Le for cardiology as well as anticoagulation, and Dr. Larose Kells for primary care. Patient took her normal dose of NPH insulin 40 units this morning after checking her initial blood sugar and finding it to be 168. Patient ate breakfast as well as a snack and lunch. She notes that she was getting ready to begin early dinner when she started feeling slightly lightheaded. She checked her glucose and found it to be 48 at that time. She then took a bite of meat ball and wasn't feeling any better. At that time she gave herself 5 units of regular insulin. She and her husband are insistent that this is how they were instructed to care for episodes of hypoglycemia if they did not resolve. Patient then became increasingly agitated and confused. EMS arrived and patient was able to drink juice. Fingerstick was 127 at that time. Patient not on any oral agents for her diabetes. Patient is alert and oriented upon arrival. She is hemodynamically stable. She denies any recent cough, fever, urinary symptoms, and nausea, vomiting, or diarrhea aside from nausea with this episode. She denies any pain or neurologic symptoms at this time. There are no other associated or modifying factors. Past Medical History  Diagnosis Date  . Blindness of left eye   . Eye muscle weakness     Right eye weakness after  cataract surgery  . Common migraine     ?hx of  . DIABETES MELLITUS, TYPE II 03/27/2006  . HYPERLIPIDEMIA 03/27/2006  . 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 HYPERTENSION 03/27/2006  . Atrial fibrillation     SVT dx 2007, cath 2007 mild CAD, then had an cardioversion, ablation; still on coumadin , has occ palpitation, EKG 03-2010 NSR  . Headache 05/07/2009  . LUNG NODULE 09/01/2006    Excision, Bx Benighn  . Osteopenia 2004    Dexa 2004 showed Osteopenia, DEXA 03/2007 normal  . Seizures     Past Surgical History  Procedure Date  . Shoulder surgery     Family History  Problem Relation Age of Onset  . Diabetes Father   . Heart attack Father 22  . Diabetes Sister   . Cancer Neg Hx     no hx of colon or breast cancer    History  Substance Use Topics  . Smoking status: Former Smoker    Quit date: 03/02/1978  . Smokeless tobacco: Not on file  . Alcohol Use: No    OB History    Grav Para Term Preterm Abortions TAB SAB Ect Mult Living                  Review of Systems  Constitutional: Negative.   HENT: Negative.   Eyes: Negative.   Respiratory: Negative.   Cardiovascular: Negative.   Gastrointestinal: Positive for nausea.  Genitourinary: Negative.   Musculoskeletal: Negative.   Skin: Negative.   Neurological: Positive for light-headedness.  Hematological: Negative.   Psychiatric/Behavioral: Negative.  All other systems reviewed and are negative.    Allergies  Procaine hcl  Home Medications   Current Outpatient Rx  Name Route Sig Dispense Refill  . ASPIRIN EC 81 MG PO TBEC Oral Take 81 mg by mouth daily.    [name] CALCIUM CARBONATE-VITAMIN D 500-200 MG-UNIT PO TABS Oral Take 1 tablet by mouth daily.    [name] DILTIAZEM HCL ER COATED BEADS 240 MG PO CP24 Oral Take 1 capsule (240 mg total) by mouth daily. 30 capsule 5  . METOPROLOL TARTRATE 25 MG PO TABS Oral Take 0.5 tablets (12.5 mg total) by mouth 2 (two) times daily. 60 tablet 12  . ONE-DAILY MULTI VITAMINS PO TABS  Oral Take 1 tablet by mouth daily.      [name] PHENYTOIN 50 MG PO CHEW Oral Chew 3 tablets (150 mg total) by mouth 3 (three) times daily. 270 tablet 3  . PRAVASTATIN SODIUM 40 MG PO TABS Oral Take 1 tablet (40 mg total) by mouth daily. 30 tablet 12  . RANITIDINE HCL 300 MG PO TABS Oral Take 1 tablet (300 mg total) by mouth at bedtime. 30 tablet 3  . WARFARIN SODIUM 2.5 MG PO TABS Oral Take 1 tablet (2.5 mg total) by mouth daily. 30 tablet 3  . CEPHALEXIN 500 MG PO CAPS Oral Take 1 capsule (500 mg total) by mouth 4 (four) times daily. 40 capsule 0  . INSULIN ISOPHANE HUMAN 100 UNIT/ML [place] SUSP Subcutaneous Inject 20 Units into the skin every morning. 1 vial 0    Only take if morning glucose >150.    BP 124/42  Pulse 58  Temp 98.2 F (36.8 C) (Oral)  Resp 18  Ht 5\' 10"  (1.778 m)  Wt 198 lb (89.812 kg)  BMI 28.41 kg/m2  SpO2 100%  Physical Exam  Nursing note and vitals reviewed. GEN: Well-developed, well-nourished female in no distress HEENT: Atraumatic, normocephalic. Oropharynx clear without erythema EYES: PERRLA BL, no scleral icterus. NECK: Trachea midline, no meningismus CV: regular rate and rhythm. No murmurs, rubs, or gallops PULM: No respiratory distress.  No crackles, wheezes, or rales. GI: soft, non-tender. No guarding, rebound, or tenderness. + bowel sounds  GU: deferred Neuro: cranial nerves grossly 2-12 intact aside from left eye blindness, no abnormalities of strength or sensation, A and O x 3, no pronator drift, no dysmetria on finger to nose task bilaterally, persistent right hand tremor MSK: Patient moves all 4 extremities symmetrically, no deformity, edema, or injury noted Skin: No rashes petechiae, purpura, or jaundice Psych: no abnormality of mood   ED Course  Procedures (including critical care time)  Indication: weakness Please note this EKG was reviewed extemporaneously by myself.   Date: 11/05/2011  Rate: 62  Rhythm: normal sinus rhythm  QRS Axis: normal   Intervals: normal  ST/T Wave abnormalities: nonspecific T wave changes  Conduction Disutrbances:none  Narrative Interpretation: No longer in A. fib with RVR. T-wave inversion in lead aVL consistent with prior EKG. No longer with ST depressions seen previously.  Old EKG Reviewed: changes noted      Labs Reviewed  CBC WITH DIFFERENTIAL - Abnormal; Notable for the following:    RBC 3.73 (*)     Hemoglobin 11.4 (*)     HCT 33.3 (*)     Monocytes Relative 16 (*)     Monocytes Absolute 1.2 (*)     All other components within normal limits  COMPREHENSIVE METABOLIC PANEL - Abnormal; Notable for the following:    Glucose, Bld  65 (*)     Alkaline Phosphatase 130 (*)     Total Bilirubin 0.2 (*)     GFR calc non Af Amer 50 (*)     GFR calc Af Amer 58 (*)     All other components within normal limits  URINALYSIS, ROUTINE W REFLEX MICROSCOPIC - Abnormal; Notable for the following:    APPearance CLOUDY (*)     Leukocytes, UA LARGE (*)     All other components within normal limits  LIPASE, BLOOD - Abnormal; Notable for the following:    Lipase 10 (*)     All other components within normal limits  PROTIME-INR - Abnormal; Notable for the following:    Prothrombin Time 16.5 (*)     All other components within normal limits  URINE MICROSCOPIC-ADD ON - Abnormal; Notable for the following:    Bacteria, UA MANY (*)     All other components within normal limits  LACTIC ACID, PLASMA  TROPONIN I  GLUCOSE, CAPILLARY  GLUCOSE, CAPILLARY  URINE CULTURE   Dg Chest 2 View  11/05/2011  *RADIOLOGY REPORT*  Clinical Data: Hypoglycemia.  CHEST - 2 VIEW  Comparison: 05/26/2011  Findings: Heart size is upper limits normal.  There are no focal consolidations or pleural effusions.  No pulmonary edema.  There is atelectasis versus early right lower lobe infiltrate.  Old rib fractures.  Deformity of the proximal humeri bilaterally.  There is wedge deformity of a lower thoracic vertebral body, likely chronic.   IMPRESSION:  1.  The right lower lobe infiltrate versus atelectasis. 2.  Old bilateral rib fractures. 3. No evidence for acute  abnormality.   Original Report Authenticated By: Glenice Bow, M.D.    Ct Head Wo Contrast  11/05/2011  *RADIOLOGY REPORT*  Clinical Data: Hypoglycemia.  Headache.  CT HEAD WITHOUT CONTRAST  Technique:  Contiguous axial images were obtained from the base of the skull through the vertex without contrast.  Comparison: MRI of the brain without contrast 08/14/2011.  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  Active disease cannot be excluded.  Mild generalized atrophy is present. Scattered periventricular subcortical white matter hypoattenuation is similar to the prior MRI scans.  No acute cortical infarct, hemorrhage, or mass lesion is present.  The ventricles are portion and to the degree of atrophy.  There is some ex vacuo dilation of the left temporal tip.  The paranasal sinuses and mastoid air cells are clear.  The osseous skull is intact.  IMPRESSION:  1.  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 Active disease is not excluded. 2.  Stable atrophy and diffuse white matter disease.  This likely reflects the sequelae of chronic microvascular ischemia.   Original Report Authenticated By: Resa Miner. MATTERN, M.D.      1. Hypoglycemia   2. UTI (urinary tract infection)   3. Subtherapeutic international normalized ratio (INR)       MDM  Patient was evaluated by myself. Based on evaluation patient appeared to have taken insulin when she was hypoglycemic and produced a worsened hypoglycemia. Patient was completely alert and oriented upon presentation. Patient reported that her endocrinologist had instructed her to take insulin following an episode of hypoglycemia if her symptoms do not resolve. We discussed that this was incorrect at length.  Patient eventually remembered that she may have been instructed this way when she was recently in a rehabilitation facility. However, patient has  numerous other medical problems including use of Coumadin and history of atrial fibrillation with RVR. EKG was unremarkable. Troponin was negative. CBC showed no leukocytosis. Head CT was unremarkable. Patient did have chest x-ray findings raise concern for atelectasis versus pneumonia. As patient denied cough and had no leukocytosis I felt this is most likely atelectasis based on my review of the film. Patient did have urinalysis which was performed showed evidence of infection. Patient denied urinary symptoms but had clear evidence of this on cath specimen. Urine culture was sent and patient was given Keflex 500 mg by mouth with a ten-day prescription. Patient also had slightly subtherapeutic INR 1.8. Patient was given an additional dose of 2.5 mg of Coumadin this evening and instructed to call her cardiologist about this tomorrow. Patient's next scheduled for INR check on Tuesday. Given the patient only takes 40 units of NPH insulin a day and the timing of her episode as well as her confusion over treatment of hypoglycemia I did speak with the endocrinologist on call. Dr. Gilford Rile advised decreasing the patient's insulin to 20 units tomorrow morning in instructing her to only use this if her glucose in the morning is greater than 150. Patient is to contact her own endocrinologist first thing in the morning to discuss if any additional changes should be made. Patient will need followup in the near future for this. Patient also has history of seizure with persistent right hand tremor. She is treated with Dilantin for this. Level was not drawn today. Patient did not have elevation of her lactate and did not have any loss of consciousness, incontinence, or other symptoms to suggest this was a seizure. Patient was discharged in good condition. She is to followup as mentioned  previously.      Chauncy Passy, MD 11/06/11 [phone]

## 2011-11-06 NOTE — Telephone Encounter (Signed)
Caller: Ron/Patient; Patient Name: [name] [name], [name] [name]; PCP: Renato Shin (Adults only); [facility] Call[facility] Phone Number: [phone]; [place],  [phone]. Reason for call: PATIENT SEEN IN ED LAST PM FOR BLOOD SUGAR OF 47.  PATIENT'S PESCRIBED DOSE OF HUMALOG 40 UNITS WAS CHANGED BY ED MD TO 20 UNITS WHICH PATIENT STARTED TODAY/THIS AM. DISCHARGE INSTRUCTIONS FROM ED SAY TO CHECK WITH OFFICE MD TO SEE IF HUMALOG SHOULD STAY AT 20 UNITS.  HUSBAND LEFT MESSAGE THIS AM AN SO FAR HAS NOT HEARD BACK FROM CLINIC. FASTING BLOOD SUGAR THIS AM WAS 161, CURRENT RANDOM BLOOD SUGAR IS 174. DENIES ANY SYMPTOMS.  WILL FORWARD TO REQUESTED INFORMATION TO DR. ELLISON/ (ELAM [facility] Pool ) TO ADVISE.

## 2011-11-06 NOTE — Telephone Encounter (Signed)
Caller: [name]/Spouse; Patient Name: Gwynne Edinger; PCP: [name]; [place] Phone Number: [phone]; Reason for call: 11/05/11 patient was doing ok but at supper time patient got dizzy.  Could not get sugar to come up from BS of 47. Caller took wife to ER.  She was examined and noted that INR was 1.8 and it was increased dosage. Insulin level adjusted  Also diagnosed with urinary tract infection and  placed on Keflex. Culture and sensitivity was sent to lab.  Caller has notified Drs Lovena Le ([facility]) and Lissa Merlin (regulates diabetes) of changes with appropriated follow up scheduled.  Husband wanted to update office and to see if any follow up was needed for the urinary tract infection.  Clinical profile verified in Epic.  Caller denied need for triage stating she is stable at this time.  OFFICE: PLEASE FOLLOW UP WITH CALLER RELATED TO ANY FOLLOW UP CARE NEEDED FOR URINARY TRACT INFECTION. THANKS

## 2011-11-07 LAB — URINE CULTURE: Culture: NO GROWTH

## 2011-11-09 NOTE — Telephone Encounter (Signed)
UCX neg, d/c keflex unless she has urinary sx (dysuria etc)

## 2011-11-09 NOTE — Telephone Encounter (Signed)
Discussed with pt & she states she is feeling better.

## 2011-11-10 ENCOUNTER — Ambulatory Visit (INDEPENDENT_AMBULATORY_CARE_PROVIDER_SITE_OTHER): Payer: Medicare Other

## 2011-11-10 DIAGNOSIS — I4891 Unspecified atrial fibrillation: Secondary | ICD-10-CM

## 2011-11-10 DIAGNOSIS — I4892 Unspecified atrial flutter: Secondary | ICD-10-CM

## 2011-11-10 DIAGNOSIS — Z7901 Long term (current) use of anticoagulants: Secondary | ICD-10-CM

## 2011-11-10 LAB — POCT INR: INR: 2.1

## 2011-11-13 ENCOUNTER — Ambulatory Visit (INDEPENDENT_AMBULATORY_CARE_PROVIDER_SITE_OTHER): Payer: Medicare Other | Admitting: Endocrinology

## 2011-11-13 ENCOUNTER — Encounter: Payer: Self-pay | Admitting: Endocrinology

## 2011-11-13 VITALS — BP 130/62 | HR 58 | Temp 97.4°F | Wt 190.0 lb

## 2011-11-13 DIAGNOSIS — E119 Type 2 diabetes mellitus without complications: Secondary | ICD-10-CM

## 2011-11-13 NOTE — Progress Notes (Signed)
Subjective:    Patient ID: [name] [name], female    DOB: May 29, 1940, 71 y.o.   MRN: [id number]  HPI Pt returns for f/u of insulin-requiring DM (dx'ed 0000000; complicated by retinopathy and peripheral sensory neuropathy; therapy has been limited by pt's need for a simple, inexpensive insulin regimen).  She was seen in ER last week for hypoglycemia.  She insists he does not take regular insulin in addition to the NPH.   Insulin was reduced to 30 units qam, and cbg's have been in the mid-100's without hypoglycemia since then.   Past Medical History  Diagnosis Date  . Blindness of left eye   . Eye muscle weakness     Right eye weakness after cataract surgery  . Common migraine     ?hx of  . DIABETES MELLITUS, TYPE II 03/27/2006  . HYPERLIPIDEMIA 03/27/2006  . 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 HYPERTENSION 03/27/2006  . Atrial fibrillation     SVT dx 2007, cath 2007 mild CAD, then had an cardioversion, ablation; still on coumadin , has occ palpitation, EKG 03-2010 NSR  . Headache 05/07/2009  . LUNG NODULE 09/01/2006    Excision, Bx Benighn  . Osteopenia 2004    Dexa 2004 showed Osteopenia, DEXA 03/2007 normal  . Seizures     Past Surgical History  Procedure Date  . Shoulder surgery     History   Social History  . Marital Status: Married    Spouse Name: N/A    Number of Children: 1  . Years of Education: N/A   Occupational History  .     Social History Main Topics  . Smoking status: Former Smoker    Quit date: 03/02/1978  . Smokeless tobacco: Not on file  . Alcohol Use: No  . Drug Use: No  . Sexually Active: No   Other Topics Concern  . Not on file   Social History Narrative   Moved from [place] in the 80's    Current Outpatient Prescriptions on File Prior to Visit  Medication Sig Dispense Refill  . aspirin EC 81 MG tablet Take 81 mg by mouth daily.      . calcium-vitamin D (OSCAL WITH D) 500-200 MG-UNIT per tablet Take 1 tablet by mouth daily.       . cephALEXin (KEFLEX) 500 MG capsule Take 1 capsule (500 mg total) by mouth 4 (four) times daily.  40 capsule  0  . diltiazem (CARDIZEM CD) 240 MG 24 hr capsule Take 1 capsule (240 mg total) by mouth daily.  30 capsule  5  . insulin NPH (HUMULIN N,NOVOLIN N) 100 UNIT/ML injection Inject 30 Units into the skin every morning.  1 vial  0  . metoprolol tartrate (LOPRESSOR) 25 MG tablet Take 0.5 tablets (12.5 mg total) by mouth 2 (two) times daily.  60 tablet  12  . Multiple Vitamin (MULTIVITAMIN) tablet Take 1 tablet by mouth daily.        . phenytoin (DILANTIN) 50 MG tablet Chew 3 tablets (150 mg total) by mouth 3 (three) times daily.  270 tablet  3  . pravastatin (PRAVACHOL) 40 MG tablet Take 1 tablet (40 mg total) by mouth daily.  30 tablet  12  . ranitidine (ZANTAC) 300 MG tablet Take 1 tablet (300 mg total) by mouth at bedtime.  30 tablet  3  . warfarin (COUMADIN) 2.5 MG tablet Take 1 tablet (2.5 mg total) by mouth daily.  30 tablet  3    Allergies  Allergen Reactions  . Procaine Hcl Anaphylaxis    Family History  Problem Relation Age of Onset  . Diabetes Father   . Heart attack Father 80  . Diabetes Sister   . Cancer Neg Hx     no hx of colon or breast cancer    BP 130/62  Pulse 58  Temp 97.4 F (36.3 C) (Oral)  Wt 190 lb (86.183 kg)  SpO2 96%    Review of Systems Denies weight change    Objective:   Physical Exam VITAL SIGNS:  See vs page GENERAL: no distress PSYCH: Alert and oriented x 3.  Does not appear anxious nor depressed.     Assessment & Plan:  Severe hypoglycemia, resolved with reduction of insulin dosage

## 2011-11-13 NOTE — Patient Instructions (Addendum)
please take NPH (cloudy) insulin, 30 units each morning, and none in the evening.    Please continue to avoid the regular (clear) insulin.   Please reuturn here in 3 months.   check your blood sugar 2 times a day.  vary the time of day when you check, between before the 3 meals, and at bedtime.  also check if you have symptoms of your blood sugar being too high or too low.  please keep a record of the readings and bring it to your next appointment here.  please call us sooner if you are having low blood sugar episodes.

## 2011-11-17 ENCOUNTER — Other Ambulatory Visit: Payer: Self-pay | Admitting: Internal Medicine

## 2011-11-17 MED ORDER — PHENYTOIN 50 MG PO CHEW: 150.0000 mg | CHEWABLE_TABLET | Freq: Three times a day (TID) | ORAL | Status: DC

## 2011-11-17 NOTE — Telephone Encounter (Signed)
phenytoin ex 100 Qty: 90 Take one capsule by mouth three times daily with 50 mg tablet Last filled:10/21/11

## 2011-11-17 NOTE — Telephone Encounter (Signed)
Refill done.  

## 2011-11-18 ENCOUNTER — Other Ambulatory Visit: Payer: Self-pay | Admitting: Internal Medicine

## 2011-11-18 NOTE — Telephone Encounter (Signed)
Notified pharmacy.

## 2011-11-18 NOTE — Telephone Encounter (Signed)
Please advise 

## 2011-11-18 NOTE — Telephone Encounter (Signed)
Pharm states: Can we fill this RX this way due to cost (taking 3 50 mg tablets is very $$$) thanks  PHENYTOIN 50 mg CHW  Take one talbet by mouth 3 times dail with 100 mg capsule

## 2011-11-18 NOTE — Telephone Encounter (Signed)
Needs to call her neurologist in [place]

## 2011-11-19 NOTE — Telephone Encounter (Signed)
Spoke with pt & pharmacy. From now on have her neurologist handle this med.

## 2011-11-19 NOTE — Telephone Encounter (Addendum)
[name] [name] from [facility] left vm on triage line stating they need to know how to get the patient her medication. The pt will be out as of 4pm today. Call her at [phone]

## 2011-12-30 ENCOUNTER — Ambulatory Visit (INDEPENDENT_AMBULATORY_CARE_PROVIDER_SITE_OTHER): Payer: Medicare Other | Admitting: Pharmacist

## 2011-12-30 DIAGNOSIS — Z7901 Long term (current) use of anticoagulants: Secondary | ICD-10-CM

## 2011-12-30 DIAGNOSIS — I4892 Unspecified atrial flutter: Secondary | ICD-10-CM

## 2011-12-30 DIAGNOSIS — I4891 Unspecified atrial fibrillation: Secondary | ICD-10-CM

## 2012-01-11 ENCOUNTER — Ambulatory Visit (INDEPENDENT_AMBULATORY_CARE_PROVIDER_SITE_OTHER): Payer: Medicare Other | Admitting: Pharmacist

## 2012-01-11 DIAGNOSIS — Z7901 Long term (current) use of anticoagulants: Secondary | ICD-10-CM

## 2012-01-11 DIAGNOSIS — I4892 Unspecified atrial flutter: Secondary | ICD-10-CM

## 2012-01-11 DIAGNOSIS — I4891 Unspecified atrial fibrillation: Secondary | ICD-10-CM

## 2012-01-14 ENCOUNTER — Other Ambulatory Visit: Payer: Self-pay | Admitting: Endocrinology

## 2012-01-19 ENCOUNTER — Ambulatory Visit (INDEPENDENT_AMBULATORY_CARE_PROVIDER_SITE_OTHER): Payer: Medicare Other | Admitting: Endocrinology

## 2012-01-19 ENCOUNTER — Encounter: Payer: Self-pay | Admitting: Endocrinology

## 2012-01-19 ENCOUNTER — Other Ambulatory Visit: Payer: Self-pay | Admitting: Internal Medicine

## 2012-01-19 VITALS — BP 130/76 | HR 65 | Temp 98.2°F | Wt 180.0 lb

## 2012-01-19 DIAGNOSIS — Z23 Encounter for immunization: Secondary | ICD-10-CM

## 2012-01-19 DIAGNOSIS — E119 Type 2 diabetes mellitus without complications: Secondary | ICD-10-CM

## 2012-01-19 NOTE — Patient Instructions (Addendum)
blood tests are being requested for you today.  We'll contact you with results. If this test is low, let's reduce your current insulin.   If it is high, let's change to an insulin that works more during the day, and less at night.  Please return here in 3 months.   check your blood sugar 2 times a day.  vary the time of day when you check, between before the 3 meals, and at bedtime.  also check if you have symptoms of your blood sugar being too high or too low.  please keep a record of the readings and bring it to your next appointment here.  please call us sooner if you are having low blood sugar episodes.

## 2012-01-19 NOTE — Progress Notes (Signed)
Subjective:    Patient ID: [name] [name], female    DOB: 06-09-1940, 71 y.o.   MRN: [id number]  HPI Pt returns for f/u of insulin-requiring DM (dx'ed 0000000; complicated by retinopathy and peripheral sensory neuropathy; therapy has been limited by pt's need for a simple, inexpensive insulin regimen; she had an episode of severe hypoglycemia in September, 2013).  CC today is ongoing memory loss.  no cbg record, but states most cbg's are in the 200's.  However, she had a cbg of 40 in the early hrs of the am, 2 weeks ago.    Past Medical History  Diagnosis Date  . Blindness of left eye   . Eye muscle weakness     Right eye weakness after cataract surgery  . Common migraine     ?hx of  . DIABETES MELLITUS, TYPE II 03/27/2006  . HYPERLIPIDEMIA 03/27/2006  . 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 HYPERTENSION 03/27/2006  . Atrial fibrillation     SVT dx 2007, cath 2007 mild CAD, then had an cardioversion, ablation; still on coumadin , has occ palpitation, EKG 03-2010 NSR  . Headache 05/07/2009  . LUNG NODULE 09/01/2006    Excision, Bx Benighn  . Osteopenia 2004    Dexa 2004 showed Osteopenia, DEXA 03/2007 normal  . Seizures     Past Surgical History  Procedure Date  . Shoulder surgery     History   Social History  . Marital Status: Married    Spouse Name: N/A    Number of Children: 1  . Years of Education: N/A   Occupational History  .     Social History Main Topics  . Smoking status: Former Smoker    Quit date: 03/02/1978  . Smokeless tobacco: Not on file  . Alcohol Use: No  . Drug Use: No  . Sexually Active: No   Other Topics Concern  . Not on file   Social History Narrative   Moved from [place] in the 80's    Current Outpatient Prescriptions on File Prior to Visit  Medication Sig Dispense Refill  . aspirin EC 81 MG tablet Take 81 mg by mouth daily.      . calcium-vitamin D (OSCAL WITH D) 500-200 MG-UNIT per tablet Take 1 tablet by mouth daily.        [name] diltiazem (CARDIZEM CD) 240 MG 24 hr capsule Take 1 capsule (240 mg total) by mouth daily.  30 capsule  5  . metoprolol tartrate (LOPRESSOR) 25 MG tablet Take 0.5 tablets (12.5 mg total) by mouth 2 (two) times daily.  60 tablet  12  . Multiple Vitamin (MULTIVITAMIN) tablet Take 1 tablet by mouth daily.        . phenytoin (DILANTIN) 50 MG tablet Chew 3 tablets (150 mg total) by mouth 3 (three) times daily.  270 tablet  3  . pravastatin (PRAVACHOL) 40 MG tablet Take 1 tablet (40 mg total) by mouth daily.  30 tablet  12  . ranitidine (ZANTAC) 300 MG tablet Take 1 tablet (300 mg total) by mouth at bedtime.  30 tablet  3  . warfarin (COUMADIN) 2.5 MG tablet TAKE ONE TABLET BY MOUTH EVERY DAY  30 tablet  3    Allergies  Allergen Reactions  . Procaine Hcl Anaphylaxis    Family History  Problem Relation Age of Onset  . Diabetes Father   . Heart attack Father 7  . Diabetes Sister   . Cancer Neg Hx     no  hx of colon or breast cancer   BP 130/76  Pulse 65  Temp 98.2 F (36.8 C) (Oral)  Wt 180 lb (81.647 kg)  SpO2 97%  Review of Systems Denies LOC    Objective:   Physical Exam VITAL SIGNS:  See vs page GENERAL: no distress Gait: normal and steady.   Lab Results  Component Value Date   HGBA1C 7.7* 01/19/2012      Assessment & Plan:  DM: Based on the pattern of her cbg's, she needs some adjustment in her therapy.

## 2012-01-20 ENCOUNTER — Telehealth: Payer: Self-pay | Admitting: *Deleted

## 2012-01-20 MED ORDER — INSULIN NPH ISOPHANE & REGULAR (70-30) 100 UNIT/ML ~~LOC~~ SUSP: 40.0000 [IU] | Freq: Every day | SUBCUTANEOUS | Status: DC

## 2012-01-20 NOTE — Telephone Encounter (Signed)
Message copied by Ellene Route on Wed Jan 20, 2012 11:16 AM ------      Message from: Renato Shin      Created: Wed Jan 20, 2012  8:35 AM       please call patient:      Your overall blood sugar is pretty good.      However, we need to change the insulin in order to eliminate the low-blood sugar in the morning.      i have sent a prescription to your pharmacy      You would take this new insulin with breakfast.

## 2012-01-20 NOTE — Telephone Encounter (Signed)
PATIENT NOTIFIED OF LAB RESULTS. INSTRUCTED ON NEW CHANGE IN INSULIN THAT  HAS BEEN SENT TO PATIENT PHARMACY PER DR. ELLISON.

## 2012-01-25 ENCOUNTER — Telehealth: Payer: Self-pay

## 2012-01-25 LAB — HM DIABETES EYE EXAM: HM Diabetic Eye Exam: NORMAL

## 2012-01-25 NOTE — Telephone Encounter (Signed)
[name] [name] at [facility] called to verify that Dr. [name] did change patients insulin, per notes in pt';s chart , Dr. [name] did change the patients insulin to humulin 70/30 insulin mix

## 2012-02-02 ENCOUNTER — Ambulatory Visit (INDEPENDENT_AMBULATORY_CARE_PROVIDER_SITE_OTHER): Payer: Medicare Other

## 2012-02-02 DIAGNOSIS — Z7901 Long term (current) use of anticoagulants: Secondary | ICD-10-CM

## 2012-02-02 DIAGNOSIS — I4891 Unspecified atrial fibrillation: Secondary | ICD-10-CM

## 2012-02-02 DIAGNOSIS — I4892 Unspecified atrial flutter: Secondary | ICD-10-CM

## 2012-02-12 ENCOUNTER — Encounter: Payer: Medicare Other | Admitting: Internal Medicine

## 2012-02-17 ENCOUNTER — Encounter (HOSPITAL_COMMUNITY): Payer: Self-pay | Admitting: Emergency Medicine

## 2012-02-17 ENCOUNTER — Emergency Department (HOSPITAL_COMMUNITY)
Admission: EM | Admit: 2012-02-17 | Discharge: 2012-02-17 | Disposition: A | Discharge status: Home / Self Care | Payer: Medicare Other | Attending: Emergency Medicine | Admitting: Emergency Medicine

## 2012-02-17 DIAGNOSIS — Z8669 Personal history of other diseases of the nervous system and sense organs: Secondary | ICD-10-CM | POA: Insufficient documentation

## 2012-02-17 DIAGNOSIS — E785 Hyperlipidemia, unspecified: Secondary | ICD-10-CM | POA: Insufficient documentation

## 2012-02-17 DIAGNOSIS — Z8739 Personal history of other diseases of the musculoskeletal system and connective tissue: Secondary | ICD-10-CM | POA: Insufficient documentation

## 2012-02-17 DIAGNOSIS — E119 Type 2 diabetes mellitus without complications: Secondary | ICD-10-CM | POA: Insufficient documentation

## 2012-02-17 DIAGNOSIS — Z8709 Personal history of other diseases of the respiratory system: Secondary | ICD-10-CM | POA: Insufficient documentation

## 2012-02-17 DIAGNOSIS — I4891 Unspecified atrial fibrillation: Secondary | ICD-10-CM | POA: Insufficient documentation

## 2012-02-17 DIAGNOSIS — Z87891 Personal history of nicotine dependence: Secondary | ICD-10-CM | POA: Insufficient documentation

## 2012-02-17 DIAGNOSIS — Z7901 Long term (current) use of anticoagulants: Secondary | ICD-10-CM | POA: Insufficient documentation

## 2012-02-17 DIAGNOSIS — H5440 Blindness, one eye, unspecified eye: Secondary | ICD-10-CM | POA: Insufficient documentation

## 2012-02-17 DIAGNOSIS — G40909 Epilepsy, unspecified, not intractable, without status epilepticus: Secondary | ICD-10-CM | POA: Insufficient documentation

## 2012-02-17 DIAGNOSIS — N390 Urinary tract infection, site not specified: Secondary | ICD-10-CM | POA: Insufficient documentation

## 2012-02-17 DIAGNOSIS — R55 Syncope and collapse: Secondary | ICD-10-CM | POA: Insufficient documentation

## 2012-02-17 DIAGNOSIS — G43909 Migraine, unspecified, not intractable, without status migrainosus: Secondary | ICD-10-CM | POA: Insufficient documentation

## 2012-02-17 DIAGNOSIS — Z7982 Long term (current) use of aspirin: Secondary | ICD-10-CM | POA: Insufficient documentation

## 2012-02-17 DIAGNOSIS — Z794 Long term (current) use of insulin: Secondary | ICD-10-CM | POA: Insufficient documentation

## 2012-02-17 DIAGNOSIS — Z79899 Other long term (current) drug therapy: Secondary | ICD-10-CM | POA: Insufficient documentation

## 2012-02-17 LAB — CBC WITH DIFFERENTIAL/PLATELET
Eosinophils Relative: 1 % (ref 0–5)
HCT: 34.2 % — ABNORMAL LOW (ref 36.0–46.0)
Lymphocytes Relative: 24 % (ref 12–46)
Lymphs Abs: 1.5 10*3/uL (ref 0.7–4.0)
MCH: 30.6 pg (ref 26.0–34.0)
MCV: 90.2 fL (ref 78.0–100.0)
Monocytes Absolute: 0.7 10*3/uL (ref 0.1–1.0)
Monocytes Relative: 11 % (ref 3–12)
RBC: 3.79 MIL/uL — ABNORMAL LOW (ref 3.87–5.11)
WBC: 6.5 10*3/uL (ref 4.0–10.5)

## 2012-02-17 LAB — URINE MICROSCOPIC-ADD ON

## 2012-02-17 LAB — URINALYSIS, ROUTINE W REFLEX MICROSCOPIC
Bilirubin Urine: NEGATIVE
Glucose, UA: 250 mg/dL — AB
Ketones, ur: NEGATIVE mg/dL
Protein, ur: NEGATIVE mg/dL

## 2012-02-17 LAB — BASIC METABOLIC PANEL
BUN: 22 mg/dL (ref 6–23)
CO2: 29 mEq/L (ref 19–32)
Calcium: 9.6 mg/dL (ref 8.4–10.5)
Creatinine, Ser: 1.09 mg/dL (ref 0.50–1.10)
Glucose, Bld: 220 mg/dL — ABNORMAL HIGH (ref 70–99)

## 2012-02-17 LAB — GLUCOSE, CAPILLARY: Glucose-Capillary: 203 mg/dL — ABNORMAL HIGH (ref 70–99)

## 2012-02-17 MED ORDER — CEPHALEXIN 250 MG PO CAPS: 500.0000 mg | ORAL_CAPSULE | Freq: Once | ORAL | Status: DC

## 2012-02-17 MED ORDER — CEPHALEXIN 500 MG PO CAPS: 500.0000 mg | ORAL_CAPSULE | Freq: Four times a day (QID) | ORAL | Status: DC

## 2012-02-17 NOTE — ED Notes (Signed)
Pt states she believes the near syncope episode was due to not eating prior to taking insulin, and the stress of her husbands condition. Pt is visibly shaky and upset. Pt states she fell earlier today and injured her knee.

## 2012-02-17 NOTE — ED Provider Notes (Signed)
The patient is feeling back to baseline and feels she became lightheaded because she hadn't eaten today. She has since had a meal and feels well. She is found to have a urinary tract infection and abx have been started. She is scheduled to see her doctor next week. Cultures pending. Stable for discharge.  Leotis Shames, PA-C 02/17/12 1425

## 2012-02-17 NOTE — ED Provider Notes (Signed)
History     CSN: [id number]  Arrival date & time 02/17/12  [id number]   First MD Initiated Contact with Patient 02/17/12 [phone]      Chief Complaint  Patient presents with  . Near Syncope    (Consider location/radiation/quality/duration/timing/severity/associated sxs/prior treatment) HPI Comments: [name] [name] is a 71 y.o. Female who was visiting with her husband, in the emergency department, when she felt near syncopal. She walked into the hallway, where a nurse assisted her and help to sit down because it appeared that she would pass out. She did not lose consciousness. A CBG was checked immediately and was 203, somewhat elevated. The patient did not eat breakfast this morning, and did take her usual medication. She denies recent fever, chills, nausea, vomiting, chest pain, shortness of breath, or abnormal bowels, and urinary function. There are no other modifying factors  The history is provided by the patient.    Past Medical History  Diagnosis Date  . Blindness of left eye   . Eye muscle weakness     Right eye weakness after cataract surgery  . Common migraine     ?hx of  . DIABETES MELLITUS, TYPE II 03/27/2006  . HYPERLIPIDEMIA 03/27/2006  . 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 HYPERTENSION 03/27/2006  . Atrial fibrillation     SVT dx 2007, cath 2007 mild CAD, then had an cardioversion, ablation; still on coumadin , has occ palpitation, EKG 03-2010 NSR  . Headache 05/07/2009  . LUNG NODULE 09/01/2006    Excision, Bx Benighn  . Osteopenia 2004    Dexa 2004 showed Osteopenia, DEXA 03/2007 normal  . Seizures     Past Surgical History  Procedure Date  . Shoulder surgery     Family History  Problem Relation Age of Onset  . Diabetes Father   . Heart attack Father 65  . Diabetes Sister   . Cancer Neg Hx     no hx of colon or breast cancer    History  Substance Use Topics  . Smoking status: Former Smoker    Quit date: 03/02/1978  . Smokeless tobacco: Not on file  .  Alcohol Use: No    OB History    Grav Para Term Preterm Abortions TAB SAB Ect Mult Living                  Review of Systems  All other systems reviewed and are negative.    Allergies  Procaine hcl  Home Medications   Current Outpatient Rx  Name  Route  Sig  Dispense  Refill  . ASPIRIN EC 81 MG PO TBEC   Oral   Take 81 mg by mouth daily.         [name] CALCIUM CARBONATE-VITAMIN D 500-200 MG-UNIT PO TABS   Oral   Take 1 tablet by mouth daily.         [name] DILTIAZEM HCL ER COATED BEADS 240 MG PO CP24   Oral   Take 1 capsule (240 mg total) by mouth daily.   30 capsule   5   . INSULIN ISOPHANE & REGULAR (70-30) 100 UNIT/ML [place] SUSP   Subcutaneous   Inject 30 Units into the skin daily with breakfast.         . METOPROLOL TARTRATE 25 MG PO TABS   Oral   Take 0.5 tablets (12.5 mg total) by mouth 2 (two) times daily.   60 tablet   12   . ONE-DAILY MULTI VITAMINS PO TABS  Oral   Take 1 tablet by mouth daily.           [name] PHENYTOIN 50 MG PO CHEW   Oral   Chew 3 tablets (150 mg total) by mouth 3 (three) times daily.   270 tablet   3   . PRAVASTATIN SODIUM 40 MG PO TABS   Oral   Take 1 tablet (40 mg total) by mouth daily.   30 tablet   12   . RANITIDINE HCL 300 MG PO TABS   Oral   Take 1 tablet (300 mg total) by mouth at bedtime.   30 tablet   3   . WARFARIN SODIUM 2.5 MG PO TABS   Oral   Take 1.25-2.5 mg by mouth at bedtime. Take 0.5 tablet on 2 days of the week and 1 tablet the rest of the week         . CEPHALEXIN 500 MG PO CAPS   Oral   Take 1 capsule (500 mg total) by mouth 4 (four) times daily.   20 capsule   0     BP 141/66  Pulse 65  Temp 97.7 F (36.5 C) (Oral)  Resp 17  SpO2 100%  Physical Exam  Nursing note and vitals reviewed. Constitutional: She is oriented to person, place, and time. She appears well-developed.       Frail, elderly  HENT:  Head: Normocephalic and atraumatic.  Eyes: Conjunctivae normal and EOM are normal.  Pupils are equal, round, and reactive to light.  Neck: Normal range of motion and phonation normal. Neck supple.  Cardiovascular: Normal rate, regular rhythm and intact distal pulses.   Pulmonary/Chest: Effort normal and breath sounds normal. She exhibits no tenderness.  Abdominal: Soft. She exhibits no distension. There is no tenderness. There is no guarding.  Musculoskeletal: Normal range of motion.  Neurological: She is alert and oriented to person, place, and time. She has normal strength. She exhibits normal muscle tone.  Skin: Skin is warm and dry.  Psychiatric: She has a normal mood and affect. Her behavior is normal. Judgment and thought content normal.    ED Course  Procedures (including critical care time)  Patient was able to eat. In emergency department. Transferred to CDU, to await her urine return, to evaluate for infection   Date: 12/18/2011  Rate: 66  Rhythm: normal sinus rhythm  QRS Axis: normal  PR and QT Intervals: PR prolonged  ST/T Wave abnormalities: normal  PR and QRS Conduction Disutrbances:first-degree A-V block   Narrative Interpretation:   Old EKG Reviewed: unchanged    Labs Reviewed  CBC WITH DIFFERENTIAL - Abnormal; Notable for the following:    RBC 3.79 (*)     Hemoglobin 11.6 (*)     HCT 34.2 (*)     All other components within normal limits  BASIC METABOLIC PANEL - Abnormal; Notable for the following:    Glucose, Bld 220 (*)     GFR calc non Af Amer 50 (*)     GFR calc Af Amer 58 (*)     All other components within normal limits  URINALYSIS, ROUTINE W REFLEX MICROSCOPIC - Abnormal; Notable for the following:    APPearance CLOUDY (*)     Glucose, UA 250 (*)     Hgb urine dipstick TRACE (*)     Leukocytes, UA LARGE (*)     All other components within normal limits  GLUCOSE, CAPILLARY - Abnormal; Notable for the following:    Glucose-Capillary 203 (*)  All other components within normal limits  URINE MICROSCOPIC-ADD ON - Abnormal;  Notable for the following:    Squamous Epithelial / LPF FEW (*)     Bacteria, UA FEW (*)     All other components within normal limits  URINE CULTURE   Nursing notes, applicable records and vitals reviewed.  Radiologic Images/Reports reviewed.    1. UTI (lower urinary tract infection)   2. Near syncope       MDM  Near-syncope. Likely multifactorial. Patient improved, with treatment. In emergency department.            Richarda Blade, MD 02/18/12 [phone]

## 2012-02-17 NOTE — ED Notes (Signed)
Pt states she feels much better after she ate something.

## 2012-02-17 NOTE — ED Notes (Signed)
Pt was in room A9 with husband when she came outside the door and began to get unsteady on her feet, the nurses caught her before she hit the ground. Pt states she took 30 units of insulin and her regular meds while in the waiting room and did not eat breakfast. CBG 203.

## 2012-02-17 NOTE — ED Notes (Signed)
Pt has trembling to right hand that is new and tingling to the same hand.

## 2012-02-17 NOTE — ED Notes (Signed)
Report given to Cecille Rubin, RN on CDU

## 2012-02-18 NOTE — ED Provider Notes (Signed)
She was started on Keflex  Medical screening examination/treatment/procedure(s) were conducted as a shared visit with non-physician practitioner(s) and myself.  I personally evaluated the patient during the encounter  Richarda Blade, MD 02/18/12 [phone]

## 2012-02-19 LAB — URINE CULTURE: Colony Count: >=100000

## 2012-02-20 NOTE — ED Notes (Signed)
+  Urine. Patient given Keflex. No sensitivity listed. Chart sent to [place] office for review.

## 2012-02-26 NOTE — ED Notes (Signed)
NO changer per Latanya Presser

## 2012-03-01 ENCOUNTER — Emergency Department (HOSPITAL_BASED_OUTPATIENT_CLINIC_OR_DEPARTMENT_OTHER): Payer: Medicare Other

## 2012-03-01 ENCOUNTER — Encounter (HOSPITAL_BASED_OUTPATIENT_CLINIC_OR_DEPARTMENT_OTHER): Payer: Self-pay | Admitting: *Deleted

## 2012-03-01 ENCOUNTER — Emergency Department (HOSPITAL_BASED_OUTPATIENT_CLINIC_OR_DEPARTMENT_OTHER)
Admission: EM | Admit: 2012-03-01 | Discharge: 2012-03-01 | Disposition: A | Discharge status: Home / Self Care | Payer: Medicare Other | Source: Home / Self Care | Attending: Emergency Medicine | Admitting: Emergency Medicine

## 2012-03-01 DIAGNOSIS — J069 Acute upper respiratory infection, unspecified: Secondary | ICD-10-CM | POA: Insufficient documentation

## 2012-03-01 DIAGNOSIS — H540X33 Blindness right eye category 3, blindness left eye category 3: Secondary | ICD-10-CM | POA: Insufficient documentation

## 2012-03-01 DIAGNOSIS — G43909 Migraine, unspecified, not intractable, without status migrainosus: Secondary | ICD-10-CM | POA: Insufficient documentation

## 2012-03-01 DIAGNOSIS — Z794 Long term (current) use of insulin: Secondary | ICD-10-CM | POA: Insufficient documentation

## 2012-03-01 DIAGNOSIS — I10 Essential (primary) hypertension: Secondary | ICD-10-CM | POA: Insufficient documentation

## 2012-03-01 DIAGNOSIS — I4891 Unspecified atrial fibrillation: Secondary | ICD-10-CM | POA: Insufficient documentation

## 2012-03-01 DIAGNOSIS — E119 Type 2 diabetes mellitus without complications: Secondary | ICD-10-CM

## 2012-03-01 DIAGNOSIS — M899 Disorder of bone, unspecified: Secondary | ICD-10-CM | POA: Insufficient documentation

## 2012-03-01 DIAGNOSIS — Z8709 Personal history of other diseases of the respiratory system: Secondary | ICD-10-CM | POA: Insufficient documentation

## 2012-03-01 DIAGNOSIS — G40909 Epilepsy, unspecified, not intractable, without status epilepticus: Secondary | ICD-10-CM | POA: Insufficient documentation

## 2012-03-01 DIAGNOSIS — M949 Disorder of cartilage, unspecified: Secondary | ICD-10-CM | POA: Insufficient documentation

## 2012-03-01 DIAGNOSIS — Z7901 Long term (current) use of anticoagulants: Secondary | ICD-10-CM | POA: Insufficient documentation

## 2012-03-01 DIAGNOSIS — Z87891 Personal history of nicotine dependence: Secondary | ICD-10-CM | POA: Insufficient documentation

## 2012-03-01 DIAGNOSIS — E785 Hyperlipidemia, unspecified: Secondary | ICD-10-CM | POA: Insufficient documentation

## 2012-03-01 DIAGNOSIS — N390 Urinary tract infection, site not specified: Secondary | ICD-10-CM

## 2012-03-01 DIAGNOSIS — R0602 Shortness of breath: Secondary | ICD-10-CM | POA: Insufficient documentation

## 2012-03-01 DIAGNOSIS — Z79899 Other long term (current) drug therapy: Secondary | ICD-10-CM | POA: Insufficient documentation

## 2012-03-01 DIAGNOSIS — Z7982 Long term (current) use of aspirin: Secondary | ICD-10-CM | POA: Insufficient documentation

## 2012-03-01 LAB — URINALYSIS, ROUTINE W REFLEX MICROSCOPIC
Nitrite: POSITIVE — AB
Specific Gravity, Urine: 1.011 (ref 1.005–1.030)
Urobilinogen, UA: 0.2 mg/dL (ref 0.0–1.0)
pH: 7 (ref 5.0–8.0)

## 2012-03-01 LAB — CBC WITH DIFFERENTIAL/PLATELET
Basophils Absolute: 0 10*3/uL (ref 0.0–0.1)
Basophils Relative: 0 % (ref 0–1)
Eosinophils Absolute: 0.1 10*3/uL (ref 0.0–0.7)
Hemoglobin: 12.4 g/dL (ref 12.0–15.0)
MCH: 31.5 pg (ref 26.0–34.0)
MCHC: 34.8 g/dL (ref 30.0–36.0)
Monocytes Relative: 12 % (ref 3–12)
Neutro Abs: 4.5 10*3/uL (ref 1.7–7.7)
Neutrophils Relative %: 68 % (ref 43–77)
RDW: 12 % (ref 11.5–15.5)

## 2012-03-01 LAB — COMPREHENSIVE METABOLIC PANEL
AST: 19 U/L (ref 0–37)
Albumin: 3.6 g/dL (ref 3.5–5.2)
Alkaline Phosphatase: 161 U/L — ABNORMAL HIGH (ref 39–117)
BUN: 18 mg/dL (ref 6–23)
Creatinine, Ser: 1.1 mg/dL (ref 0.50–1.10)
Potassium: 4.1 mEq/L (ref 3.5–5.1)
Total Protein: 7.4 g/dL (ref 6.0–8.3)

## 2012-03-01 LAB — PROTIME-INR
INR: 1 (ref 0.00–1.49)
Prothrombin Time: 13.1 seconds (ref 11.6–15.2)

## 2012-03-01 LAB — GLUCOSE, CAPILLARY
Glucose-Capillary: 230 mg/dL — ABNORMAL HIGH (ref 70–99)
Glucose-Capillary: 139 mg/dL — ABNORMAL HIGH (ref 70–99)

## 2012-03-01 MED ORDER — SODIUM CHLORIDE 0.9 % IV SOLN
Freq: Once | INTRAVENOUS | Status: AC
  Administered 2012-03-01: 500 mL via INTRAVENOUS

## 2012-03-01 MED ORDER — CIPROFLOXACIN IN D5W 400 MG/200ML IV SOLN
400.0000 mg | Freq: Once | INTRAVENOUS | Status: AC
  Administered 2012-03-01: 400 mg via INTRAVENOUS

## 2012-03-01 MED ORDER — CIPROFLOXACIN HCL 500 MG PO TABS: 500.0000 mg | ORAL_TABLET | Freq: Two times a day (BID) | ORAL | Status: DC

## 2012-03-01 MED ORDER — ALBUTEROL SULFATE HFA 108 (90 BASE) MCG/ACT IN AERS
2.0000 | INHALATION_SPRAY | RESPIRATORY_TRACT | Status: DC | PRN
  Administered 2012-03-01: 2 via RESPIRATORY_TRACT
  Filled 2012-03-01: qty 6.7

## 2012-03-01 MED ORDER — DEXTROSE 5 % IV SOLN: 1.0000 g | Freq: Once | INTRAVENOUS | Status: DC

## 2012-03-01 MED ORDER — ONDANSETRON HCL 4 MG/2ML IJ SOLN
4.0000 mg | Freq: Once | INTRAMUSCULAR | Status: AC
  Administered 2012-03-01: 4 mg via INTRAVENOUS
  Filled 2012-03-01: qty 2

## 2012-03-01 MED ORDER — SODIUM CHLORIDE 0.9 % IV BOLUS (SEPSIS)
1000.0000 mL | Freq: Once | INTRAVENOUS | Status: AC
  Administered 2012-03-01: 1000 mL via INTRAVENOUS

## 2012-03-01 MED ORDER — CIPROFLOXACIN IN D5W 400 MG/200ML IV SOLN
INTRAVENOUS | Status: AC
  Administered 2012-03-01: 400 mg via INTRAVENOUS
  Filled 2012-03-01: qty 200

## 2012-03-01 NOTE — ED Notes (Signed)
Pt reports chest pain and shortness of breath x 1 week. Husband recently dc'ed from hospital with pneumonia. Pt reports non-productive cough and generalized weakness. Denies nausea/vomiting.

## 2012-03-01 NOTE — ED Notes (Signed)
CBG 139.  RN Rose Fillers notified.

## 2012-03-01 NOTE — ED Notes (Signed)
The patient's CBG is 230 at this time. An EKG was done on arrival and shown to Dr. Jeanell Sparrow. An old EKG was pulled from the muse system and also given to the doctor.

## 2012-03-01 NOTE — ED Notes (Signed)
Family at bedside. The patient is undressed and in a gown. The bed rails are up and the call light is within reach. The bed is locked and in the lowest position, and a warm blanket has been given.

## 2012-03-01 NOTE — ED Notes (Signed)
Patient transported to X-ray 

## 2012-03-01 NOTE — ED Provider Notes (Signed)
History     CSN: [id number]  Arrival date & time 03/01/12  1024   First MD Initiated Contact with Patient 03/01/12 1041      Chief Complaint  Patient presents with  . Chest Pain  . Shortness of Breath    (Consider location/radiation/quality/duration/timing/severity/associated sxs/prior treatment) HPI Patient with generalized weakness and cough for week. Her husband was recently diagnosed with pneumonia and she is concerned that she has pneumonia. She has pain with coughing. The cough is nonproductive. Not had nausea or vomiting. She is not a smoker. She has not had a fever and no chills have been noted. Past Medical History  Diagnosis Date  . Blindness of left eye   . Eye muscle weakness     Right eye weakness after cataract surgery  . Common migraine     ?hx of  . DIABETES MELLITUS, TYPE II 03/27/2006  . HYPERLIPIDEMIA 03/27/2006  . 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 HYPERTENSION 03/27/2006  . Atrial fibrillation     SVT dx 2007, cath 2007 mild CAD, then had an cardioversion, ablation; still on coumadin , has occ palpitation, EKG 03-2010 NSR  . Headache 05/07/2009  . LUNG NODULE 09/01/2006    Excision, Bx Benighn  . Osteopenia 2004    Dexa 2004 showed Osteopenia, DEXA 03/2007 normal  . Seizures     Past Surgical History  Procedure Date  . Shoulder surgery     Family History  Problem Relation Age of Onset  . Diabetes Father   . Heart attack Father 69  . Diabetes Sister   . Cancer Neg Hx     no hx of colon or breast cancer    History  Substance Use Topics  . Smoking status: Former Smoker    Quit date: 03/02/1978  . Smokeless tobacco: Not on file  . Alcohol Use: No    OB History    Grav Para Term Preterm Abortions TAB SAB Ect Mult Living                  Review of Systems  Constitutional: Negative for fever, chills, activity change, appetite change and unexpected weight change.  HENT: Negative for sore throat, rhinorrhea, neck pain, neck stiffness and  sinus pressure.   Eyes: Negative for visual disturbance.  Respiratory: Negative for cough and shortness of breath.   Cardiovascular: Negative for chest pain and leg swelling.  Gastrointestinal: Negative for vomiting, abdominal pain, diarrhea and blood in stool.  Genitourinary: Negative for dysuria, urgency, frequency, vaginal discharge and difficulty urinating.  Musculoskeletal: Negative for myalgias, arthralgias and gait problem.  Skin: Negative for color change and rash.  Neurological: Negative for weakness, light-headedness and headaches.  Hematological: Does not bruise/bleed easily.  Psychiatric/Behavioral: Negative for dysphoric mood.    Allergies  Procaine hcl  Home Medications   Current Outpatient Rx  Name  Route  Sig  Dispense  Refill  . ASPIRIN EC 81 MG PO TBEC   Oral   Take 81 mg by mouth daily.         [name] CALCIUM CARBONATE-VITAMIN D 500-200 MG-UNIT PO TABS   Oral   Take 1 tablet by mouth daily.         . CEPHALEXIN 500 MG PO CAPS   Oral   Take 1 capsule (500 mg total) by mouth 4 (four) times daily.   20 capsule   0   . DILTIAZEM HCL ER COATED BEADS 240 MG PO CP24   Oral   Take 1  capsule (240 mg total) by mouth daily.   30 capsule   5   . INSULIN ISOPHANE & REGULAR (70-30) 100 UNIT/ML [place] SUSP   Subcutaneous   Inject 30 Units into the skin daily with breakfast.         . METOPROLOL TARTRATE 25 MG PO TABS   Oral   Take 0.5 tablets (12.5 mg total) by mouth 2 (two) times daily.   60 tablet   12   . ONE-DAILY MULTI VITAMINS PO TABS   Oral   Take 1 tablet by mouth daily.           [name] PHENYTOIN 50 MG PO CHEW   Oral   Chew 3 tablets (150 mg total) by mouth 3 (three) times daily.   270 tablet   3   . PRAVASTATIN SODIUM 40 MG PO TABS   Oral   Take 1 tablet (40 mg total) by mouth daily.   30 tablet   12   . RANITIDINE HCL 300 MG PO TABS   Oral   Take 1 tablet (300 mg total) by mouth at bedtime.   30 tablet   3   . WARFARIN SODIUM 2.5 MG PO  TABS   Oral   Take 1.25-2.5 mg by mouth at bedtime. Take 0.5 tablet on 2 days of the week and 1 tablet the rest of the week           BP 127/78  Pulse 62  Temp 98.9 F (37.2 C) (Rectal)  Resp 24  SpO2 99%  Physical Exam  Vitals reviewed. Constitutional: She is oriented to person, place, and time. She appears well-developed and well-nourished.  HENT:  Head: Normocephalic and atraumatic.  Right Ear: External ear normal.  Left Ear: External ear normal.  Nose: Nose normal.  Mouth/Throat: Oropharynx is clear and moist.  Eyes: Conjunctivae normal and EOM are normal. Pupils are equal, round, and reactive to light.  Neck: Normal range of motion. Neck supple.  Cardiovascular: Normal rate, regular rhythm, normal heart sounds and intact distal pulses.   Pulmonary/Chest: Effort normal and breath sounds normal.       Right-sided chest wall scar.  Abdominal: Soft. Bowel sounds are normal.  Musculoskeletal: Normal range of motion.  Neurological: She is alert and oriented to person, place, and time.  Skin: Skin is warm and dry.  Psychiatric: She has a normal mood and affect.    ED Course  Procedures (including critical care time)  Labs Reviewed  GLUCOSE, CAPILLARY - Abnormal; Notable for the following:    Glucose-Capillary 230 (*)     All other components within normal limits  CBC WITH DIFFERENTIAL - Abnormal; Notable for the following:    HCT 35.6 (*)     All other components within normal limits  COMPREHENSIVE METABOLIC PANEL - Abnormal; Notable for the following:    Sodium 134 (*)     Glucose, Bld 271 (*)     Alkaline Phosphatase 161 (*)     GFR calc non Af Amer 50 (*)     GFR calc Af Amer 58 (*)     All other components within normal limits  URINALYSIS, ROUTINE W REFLEX MICROSCOPIC - Abnormal; Notable for the following:    Glucose, UA 500 (*)     Nitrite POSITIVE (*)     Leukocytes, UA MODERATE (*)     All other components within normal limits  GLUCOSE, CAPILLARY -  Abnormal; Notable for the following:    Glucose-Capillary 139 (*)  All other components within normal limits  URINE MICROSCOPIC-ADD ON - Abnormal; Notable for the following:    Squamous Epithelial / LPF FEW (*)     Bacteria, UA MANY (*)     All other components within normal limits  PROTIME-INR  CULTURE, BLOOD (ROUTINE X 2)  CULTURE, BLOOD (ROUTINE X 2)  URINE CULTURE   Dg Chest 2 View  03/01/2012  *RADIOLOGY REPORT*  Clinical Data: Shortness of breath and cough.  CHEST - 2 VIEW  Comparison: 11/05/2011 and 05/26/2011.  Findings: The cardiac silhouette, mediastinal and hilar contours are within normal limits and stable.  Stable right middle lobe scarring changes.  No definite infiltrates, edema or effusions. The bony thorax is intact.  Remote rib fractures are noted.  IMPRESSION: Chronic lung changes with right middle lobe scarring.  No definite acute overlying pulmonary process.   Original Report Authenticated By: Marijo Sanes, M.D.      No diagnosis found.  BP 127/78  Pulse 62  Temp 98.9 F (37.2 C) (Rectal)  Resp 24  SpO2 99%   Date: 03/03/2012  Rate: 61  Rhythm: normal sinus rhythm  QRS Axis: normal  Intervals: normal  ST/T Wave abnormalities: normal  Conduction Disutrbances:first-degree A-V block   Narrative Interpretation:   Old EKG Reviewed: none available   MDM   Patient without infiltrate on cxr and clear lung exam with normal sats and vital signs.  Patient with ongoing uti and will receive rocephin.  Patient to have urine cultured and will be treated with cipro as culture from 12/18 grew pseudomonas.        Shaune Pollack, MD 03/03/12 [phone]

## 2012-03-03 ENCOUNTER — Inpatient Hospital Stay (HOSPITAL_COMMUNITY)
Admission: AD | Admit: 2012-03-03 | Discharge: 2012-03-04 | DRG: 202 | Disposition: A | Discharge status: Home / Self Care | Payer: Medicare Other | Source: Ambulatory Visit | Attending: Family Medicine | Admitting: Family Medicine

## 2012-03-03 ENCOUNTER — Observation Stay (HOSPITAL_COMMUNITY): Payer: Medicare Other

## 2012-03-03 ENCOUNTER — Ambulatory Visit (INDEPENDENT_AMBULATORY_CARE_PROVIDER_SITE_OTHER): Payer: Medicare Other | Admitting: Internal Medicine

## 2012-03-03 ENCOUNTER — Encounter: Payer: Self-pay | Admitting: Internal Medicine

## 2012-03-03 ENCOUNTER — Encounter (HOSPITAL_COMMUNITY): Payer: Self-pay

## 2012-03-03 VITALS — BP 120/72 | HR 61 | Temp 97.6°F

## 2012-03-03 DIAGNOSIS — J189 Pneumonia, unspecified organism: Secondary | ICD-10-CM

## 2012-03-03 DIAGNOSIS — I10 Essential (primary) hypertension: Secondary | ICD-10-CM

## 2012-03-03 DIAGNOSIS — M949 Disorder of cartilage, unspecified: Secondary | ICD-10-CM | POA: Diagnosis present

## 2012-03-03 DIAGNOSIS — Z87891 Personal history of nicotine dependence: Secondary | ICD-10-CM

## 2012-03-03 DIAGNOSIS — M899 Disorder of bone, unspecified: Secondary | ICD-10-CM

## 2012-03-03 DIAGNOSIS — R569 Unspecified convulsions: Secondary | ICD-10-CM

## 2012-03-03 DIAGNOSIS — E1139 Type 2 diabetes mellitus with other diabetic ophthalmic complication: Secondary | ICD-10-CM | POA: Diagnosis present

## 2012-03-03 DIAGNOSIS — I4891 Unspecified atrial fibrillation: Secondary | ICD-10-CM

## 2012-03-03 DIAGNOSIS — J984 Other disorders of lung: Secondary | ICD-10-CM

## 2012-03-03 DIAGNOSIS — A498 Other bacterial infections of unspecified site: Secondary | ICD-10-CM

## 2012-03-03 DIAGNOSIS — Z7901 Long term (current) use of anticoagulants: Secondary | ICD-10-CM

## 2012-03-03 DIAGNOSIS — E785 Hyperlipidemia, unspecified: Secondary | ICD-10-CM

## 2012-03-03 DIAGNOSIS — J209 Acute bronchitis, unspecified: Principal | ICD-10-CM

## 2012-03-03 DIAGNOSIS — B9620 Unspecified Escherichia coli [E. coli] as the cause of diseases classified elsewhere: Secondary | ICD-10-CM

## 2012-03-03 DIAGNOSIS — K219 Gastro-esophageal reflux disease without esophagitis: Secondary | ICD-10-CM

## 2012-03-03 DIAGNOSIS — H3500 Unspecified background retinopathy: Secondary | ICD-10-CM

## 2012-03-03 DIAGNOSIS — N390 Urinary tract infection, site not specified: Secondary | ICD-10-CM

## 2012-03-03 DIAGNOSIS — Z79899 Other long term (current) drug therapy: Secondary | ICD-10-CM

## 2012-03-03 DIAGNOSIS — Z7982 Long term (current) use of aspirin: Secondary | ICD-10-CM

## 2012-03-03 DIAGNOSIS — E119 Type 2 diabetes mellitus without complications: Secondary | ICD-10-CM

## 2012-03-03 DIAGNOSIS — G609 Hereditary and idiopathic neuropathy, unspecified: Secondary | ICD-10-CM

## 2012-03-03 DIAGNOSIS — E11319 Type 2 diabetes mellitus with unspecified diabetic retinopathy without macular edema: Secondary | ICD-10-CM | POA: Diagnosis present

## 2012-03-03 DIAGNOSIS — G9340 Encephalopathy, unspecified: Secondary | ICD-10-CM

## 2012-03-03 DIAGNOSIS — E876 Hypokalemia: Secondary | ICD-10-CM

## 2012-03-03 DIAGNOSIS — M25519 Pain in unspecified shoulder: Secondary | ICD-10-CM

## 2012-03-03 LAB — PROTIME-INR
INR: 1.35 (ref 0.00–1.49)
Prothrombin Time: 16.4 seconds — ABNORMAL HIGH (ref 11.6–15.2)

## 2012-03-03 LAB — CBC WITH DIFFERENTIAL/PLATELET
Basophils Absolute: 0 10*3/uL (ref 0.0–0.1)
Basophils Relative: 0 % (ref 0–1)
Eosinophils Relative: 1 % (ref 0–5)
HCT: 34.4 % — ABNORMAL LOW (ref 36.0–46.0)
Lymphocytes Relative: 28 % (ref 12–46)
MCHC: 35.5 g/dL (ref 30.0–36.0)
Monocytes Absolute: 0.8 10*3/uL (ref 0.1–1.0)
Neutro Abs: 3.6 10*3/uL (ref 1.7–7.7)
Platelets: 320 10*3/uL (ref 150–400)
RDW: 12.2 % (ref 11.5–15.5)
WBC: 6.3 10*3/uL (ref 4.0–10.5)

## 2012-03-03 LAB — GLUCOSE, CAPILLARY
Glucose-Capillary: 205 mg/dL — ABNORMAL HIGH (ref 70–99)
Glucose-Capillary: 108 mg/dL — ABNORMAL HIGH (ref 70–99)

## 2012-03-03 LAB — BASIC METABOLIC PANEL
BUN: 17 mg/dL (ref 6–23)
Calcium: 10 mg/dL (ref 8.4–10.5)
Chloride: 95 mEq/L — ABNORMAL LOW (ref 96–112)
Creatinine, Ser: 1.11 mg/dL — ABNORMAL HIGH (ref 0.50–1.10)
GFR calc Af Amer: 57 mL/min — ABNORMAL LOW (ref >90)

## 2012-03-03 MED ORDER — METOPROLOL TARTRATE 12.5 MG HALF TABLET
12.5000 mg | ORAL_TABLET | Freq: Two times a day (BID) | ORAL | Status: DC
  Administered 2012-03-03 – 2012-03-04 (×3): 12.5 mg via ORAL
  Filled 2012-03-03 (×4): qty 1

## 2012-03-03 MED ORDER — PHENYTOIN 50 MG PO CHEW
150.0000 mg | CHEWABLE_TABLET | Freq: Three times a day (TID) | ORAL | Status: DC
  Administered 2012-03-03 – 2012-03-04 (×3): 150 mg via ORAL
  Filled 2012-03-03 (×5): qty 3

## 2012-03-03 MED ORDER — SIMVASTATIN 20 MG PO TABS
20.0000 mg | ORAL_TABLET | Freq: Every day | ORAL | Status: DC
  Administered 2012-03-03: 20 mg via ORAL
  Filled 2012-03-03 (×2): qty 1

## 2012-03-03 MED ORDER — SODIUM CHLORIDE 0.9 % IJ SOLN: 3.0000 mL | Freq: Two times a day (BID) | INTRAMUSCULAR | Status: DC

## 2012-03-03 MED ORDER — WARFARIN SODIUM 2.5 MG PO TABS
2.5000 mg | ORAL_TABLET | Freq: Once | ORAL | Status: AC
  Administered 2012-03-03: 2.5 mg via ORAL
  Filled 2012-03-03: qty 1

## 2012-03-03 MED ORDER — ALBUTEROL SULFATE (5 MG/ML) 0.5% IN NEBU
2.5000 mg | INHALATION_SOLUTION | Freq: Four times a day (QID) | RESPIRATORY_TRACT | Status: DC
  Administered 2012-03-03 (×2): 2.5 mg via RESPIRATORY_TRACT
  Filled 2012-03-03 (×2): qty 0.5

## 2012-03-03 MED ORDER — DILTIAZEM HCL ER COATED BEADS 240 MG PO CP24
240.0000 mg | ORAL_CAPSULE | Freq: Every day | ORAL | Status: DC
  Administered 2012-03-04: 240 mg via ORAL
  Filled 2012-03-03: qty 1

## 2012-03-03 MED ORDER — INSULIN ASPART 100 UNIT/ML ~~LOC~~ SOLN
0.0000 [IU] | Freq: Three times a day (TID) | SUBCUTANEOUS | Status: DC
  Administered 2012-03-03: 5 [IU] via SUBCUTANEOUS
  Administered 2012-03-04 (×2): 3 [IU] via SUBCUTANEOUS

## 2012-03-03 MED ORDER — WARFARIN - PHARMACIST DOSING INPATIENT: Freq: Every day | Status: DC

## 2012-03-03 MED ORDER — GUAIFENESIN-DM 100-10 MG/5ML PO SYRP
5.0000 mL | ORAL_SOLUTION | ORAL | Status: DC | PRN
  Administered 2012-03-03: 5 mL via ORAL
  Filled 2012-03-03: qty 10

## 2012-03-03 MED ORDER — SODIUM CHLORIDE 0.9 % IV SOLN
INTRAVENOUS | Status: DC
  Administered 2012-03-03: 16:00:00 via INTRAVENOUS

## 2012-03-03 MED ORDER — FAMOTIDINE 20 MG PO TABS
40.0000 mg | ORAL_TABLET | Freq: Every day | ORAL | Status: DC
  Administered 2012-03-03 – 2012-03-04 (×2): 40 mg via ORAL
  Filled 2012-03-03 (×2): qty 2

## 2012-03-03 MED ORDER — SODIUM CHLORIDE 0.9 % IJ SOLN: 3.0000 mL | INTRAMUSCULAR | Status: DC | PRN

## 2012-03-03 MED ORDER — ASPIRIN EC 81 MG PO TBEC
81.0000 mg | DELAYED_RELEASE_TABLET | Freq: Every day | ORAL | Status: DC
  Administered 2012-03-04: 81 mg via ORAL
  Filled 2012-03-03: qty 1

## 2012-03-03 MED ORDER — ALBUTEROL SULFATE (5 MG/ML) 0.5% IN NEBU: 2.5000 mg | INHALATION_SOLUTION | RESPIRATORY_TRACT | Status: DC | PRN

## 2012-03-03 MED ORDER — SODIUM CHLORIDE 0.9 % IV SOLN: 250.0000 mL | INTRAVENOUS | Status: DC | PRN

## 2012-03-03 MED ORDER — INSULIN ASPART PROT & ASPART (70-30 MIX) 100 UNIT/ML ~~LOC~~ SUSP
30.0000 [IU] | Freq: Every day | SUBCUTANEOUS | Status: DC
  Administered 2012-03-04: 30 [IU] via SUBCUTANEOUS
  Filled 2012-03-03: qty 3

## 2012-03-03 MED ORDER — ADULT MULTIVITAMIN W/MINERALS CH
1.0000 | ORAL_TABLET | Freq: Every day | ORAL | Status: DC
  Administered 2012-03-04: 1 via ORAL
  Filled 2012-03-03: qty 1

## 2012-03-03 MED ORDER — CALCIUM CARBONATE-VITAMIN D 500-200 MG-UNIT PO TABS
1.0000 | ORAL_TABLET | Freq: Every day | ORAL | Status: DC
  Administered 2012-03-04: 1 via ORAL
  Filled 2012-03-03: qty 1

## 2012-03-03 MED ORDER — LEVOFLOXACIN IN D5W 750 MG/150ML IV SOLN
750.0000 mg | INTRAVENOUS | Status: DC
  Administered 2012-03-03: 750 mg via INTRAVENOUS
  Filled 2012-03-03 (×3): qty 150

## 2012-03-03 NOTE — Patient Instructions (Addendum)
We are admitting you to [facility], telemetry, TEAM  #7

## 2012-03-03 NOTE — Progress Notes (Signed)
  Subjective:    Patient ID: [name] [name], female    DOB: 05/02/1940, 72 y.o.   MRN: [id number]  HPI Patient was scheduled for a CPX but we are doing an acute visit as she was noted to be quite weak when she was at the waitingroom Chart is reviewed: ER visit 02/17/2012, she was seen with near syncope, a urinalysis showing Pseudomonas, she was prescribed Keflex. ER visit 01/31/2012: Was seen with cough for one week and weakness. Chest x-ray was negative, BMP, LFTs, CBC is within normal. INR 1.0 Urine culture is positive, ID is pending , was Rx cipro   Past Medical History:   Diabetes with neuropathy and retinopathy---> worse after B laser surgery (actually blind on the L)   Hyperlipidemia Hypertension Paroxysmal atrial fibrillation---> SVT dx 2007, cath 2007 mild CAD, then had an cardioversion, ablation; still on coumadin , has occ palpitation, EKG 03-2010 NSR   Lung nodule excision 2008, pathology benign. ? Osteopenia  DEXA 2004 showed osteopenia, DEXA 03-2007 normal   ? 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 She is coughing very frequently, has some wheezing. Appetite is okay. She did not like to go over her medication list, although reports  she's taking her medications. INR was 1.0 two days ago . Admits to be mildly short of breath      Objective:   Physical Exam Alert, oriented x3, we placed her in a wheelchair, she is coughing frequently, speaking in incomplete sentences. Vital signs stable, O2 sat on room air 99% HEENT: Nose is slightly congested, face symmetric, not tender to  palpation Lungs: Has bilaterally large airway congestion and wheezing, no crackles. After a neb treatment, wheezing was better but she has crackles at bases  Cardiovascular; regular rate and rhythm, no murmur Abdomen: Not distended, soft, nontender Extremities without edema     Assessment & Plan:   Patient presents today with severe cough and wheezing. She has no history of asthma or COPD which is a former smoker. After one nebulization treatment, I found that she has crackles at the bases. She is too short of breath and speaking in complete sentences. I suspect pneumonia on clinical grounds.  Plan: Admit to the hospital with the following issues Suspected pneumonia UTI History of paroxysmal A. fib, on Coumadin, INR 1.0 two days ago despite relatively good compliance with Coumadin Discussed with Dr.Divine, admit to telemetry, [facility], TEAM -7

## 2012-03-03 NOTE — H&P (Signed)
[facility] History and Physical  [place] [id number] DOB: [birth year] DOA: 03/03/2012  Referring physician: PCP PCP: [name], MD   Chief Complaint:  Cough with shortness of breath for 5 days  HPI:  72 year old female with history off diabetes mellitus with retinopathy (blindness over left eye), A. 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 Tissue informs that her husband had pneumonia about 2 weeks back and was discharged from the hospital. She then started having cough with occasional mucoid phlegm, shortness of breath with wheezing and generalized weakness with chills pus 5 days. She was seen by PCP and order for chest x-ray which was negative for pneumonia. However patient did not improve and symptoms. She she is being treated with ciprofloxacin for UTI.  This in by her PCP for direct admit with concerns for pneumonia. On arrival to the medical floor patient informs the above-mentioned symptoms and then becomes shaky and starts to cry out loud. She didn't come down once her husband sat next to her.  Review of Systems:  Constitutional: Subjective fever with chills and generalized fatigue, denies diaphoresis, appetite change  HEENT: Denies photophobia, eye pain, redness, hearing loss, ear pain, congestion, sore throat, rhinorrhea, sneezing, mouth sores, trouble swallowing, neck pain, neck stiffness and tinnitus.   Respiratory: Cough with occasional mucus expectoration, shortness of breath and wheezing Denies chest tightness,     Cardiovascular: Denies chest pain, palpitations and leg swelling.  Gastrointestinal: Denies nausea, vomiting, abdominal pain, diarrhea, constipation, blood in stool and abdominal distention.  Genitourinary: Dysuria present, urgency, frequency,  hematuria, flank pain and difficulty urinating.  Musculoskeletal: Denies myalgias, back pain, joint swelling, arthralgias and gait problem.  Skin: Denies pallor, rash and wound.  Neurological: Denies dizziness, seizures, syncope, weakness, light-headedness, numbness and headaches.  Hematological: Denies adenopathy. Easy bruising, personal or family bleeding history  Psychiatric/Behavioral: Denies suicidal ideation, mood changes, confusion, nervousness, sleep disturbance and agitation. Tearful during conversation.   Past Medical History  Diagnosis Date  . Blindness of left eye   . Eye muscle weakness     Right eye weakness after cataract surgery  . Common migraine     ?hx of  . DIABETES MELLITUS, TYPE II 03/27/2006  . HYPERLIPIDEMIA 03/27/2006  . 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 HYPERTENSION 03/27/2006  . Atrial fibrillation     SVT dx 2007, cath 2007 mild CAD, then had an cardioversion, ablation; still on coumadin , has occ palpitation, EKG 03-2010 NSR  . Headache 05/07/2009  . LUNG NODULE 09/01/2006    Excision, Bx Benighn  . Osteopenia 2004    Dexa 2004 showed Osteopenia, DEXA 03/2007 normal  . Seizures    Past Surgical History  Procedure Date  . Shoulder surgery    Social History:  reports that she quit smoking about 34 years ago. She does not have any smokeless tobacco history on file. She reports that she does not drink alcohol or use illicit drugs.  Allergies  Allergen Reactions  . Procaine Hcl Anaphylaxis    Family History  Problem Relation Age of Onset  . Diabetes Father   . Heart attack Father 41  . Diabetes Sister   . Cancer Neg Hx     no hx of colon or breast cancer    Prior to Admission medications   Medication Sig Start Date  End Date Taking? Authorizing Provider  aspirin EC 81 MG tablet Take 81 mg by mouth daily.    Historical Provider, MD  calcium-vitamin D (OSCAL WITH D) 500-200 MG-UNIT per tablet Take 1 tablet by mouth daily.    Historical Provider,  MD  cephALEXin (KEFLEX) 500 MG capsule Take 1 capsule (500 mg total) by mouth 4 (four) times daily. 02/17/12   Shari A Upstill, PA-C  ciprofloxacin (CIPRO) 500 MG tablet Take 1 tablet (500 mg total) by mouth every 12 (twelve) hours. 03/01/12   Shaune Pollack, MD  diltiazem (CARDIZEM CD) 240 MG 24 hr capsule Take 1 capsule (240 mg total) by mouth daily. 08/03/11 08/02/12  Colon Branch, MD  insulin NPH-insulin regular (HUMULIN 70/30) (70-30) 100 UNIT/ML injection Inject 30 Units into the skin daily with breakfast.    Historical Provider, MD  metoprolol tartrate (LOPRESSOR) 25 MG tablet Take 0.5 tablets (12.5 mg total) by mouth 2 (two) times daily. 07/29/11   Colon Branch, MD  Multiple Vitamin (MULTIVITAMIN) tablet Take 1 tablet by mouth daily.      Historical Provider, MD  phenytoin (DILANTIN) 50 MG tablet Chew 3 tablets (150 mg total) by mouth 3 (three) times daily. 11/17/11 11/16/12  Colon Branch, MD  pravastatin (PRAVACHOL) 40 MG tablet Take 1 tablet (40 mg total) by mouth daily. 07/29/11   Colon Branch, MD  ranitidine (ZANTAC) 300 MG tablet Take 1 tablet (300 mg total) by mouth at bedtime. 10/05/11 10/04/12  Colon Branch, MD  warfarin (COUMADIN) 2.5 MG tablet Take 1.25-2.5 mg by mouth at bedtime. Take 0.5 tablet on 2 days of the week and 1 tablet the rest of the week    Historical Provider, MD    Physical Exam:  Filed Vitals:   03/03/12 1234  BP: 121/45  Pulse: 66  Temp: 97.3 F (36.3 C)  TempSrc: Oral  Resp: 16  Height: 5\' 9"  (1.753 m)  Weight: 81.647 kg (180 lb)  SpO2: 100%    Constitutional: Vital signs reviewed.  Patient is a well-developed and well-nourished in no acute distress and cooperative with exam. Alert and oriented x3.  Head: Normocephalic and atraumatic Ear: TM normal bilaterally Mouth: no erythema or exudates, MMM Eyes: PERRL, EOMI, conjunctivae normal, No scleral icterus.  left eye legally blind Neck: Supple, Trachea midline normal ROM, No JVD, mass, thyromegaly, or carotid bruit  present.  Cardiovascular: RRR, S1 normal, S2 normal, no MRG, pulses symmetric and intact bilaterally Pulmonary/Chest: Coarse breath sounds bilaterally, scattered wheezes and rhonchi Abdominal: Soft. Non-tender, non-distended, bowel sounds are normal, no masses, organomegaly, or guarding present.  GU: no CVA tenderness Musculoskeletal: No joint deformities, erythema, or stiffness, ROM full and no nontender Ext: no edema and no cyanosis, pulses palpable bilaterally (DP and PT) Hematology: no cervical, inginal, or axillary adenopathy.  Neurological: A&O x3, Strenght is normal and symmetric bilaterally, cranial nerve II-XII are grossly intact, no focal motor deficit, sensory intact to light touch bilaterally.  Skin: Warm, dry and intact. No rash, cyanosis, or clubbing.  Psychiatric: Normal mood and affect. speech and behavior is normal. Judgment and thought content normal. Cognition and memory are normal.   Labs on Admission:  Basic Metabolic Panel:  Lab [id number] 1055  NA 134*  K 4.1  CL 96  CO2 25  GLUCOSE 271*  BUN 18  CREATININE 1.10  CALCIUM 9.4  MG --  PHOS --   Liver Function Tests:  Lab 03/01/12 1055  AST 19  ALT 12  ALKPHOS 161*  BILITOT 0.3  PROT 7.4  ALBUMIN 3.6   No results found for this basename: LIPASE:5,AMYLASE:5 in the last 168 hours No results found for this basename: AMMONIA:5 in the last 168 hours CBC:  Lab 03/01/12 1055  WBC 6.6  NEUTROABS 4.5  HGB 12.4  HCT 35.6*  MCV 90.4  PLT 274   Cardiac Enzymes: No results found for this basename: CKTOTAL:5,CKMB:5,CKMBINDEX:5,TROPONINI:5 in the last 168 hours BNP: No components found with this basename: POCBNP:5 CBG:  Lab 03/01/12 1313 03/01/12 1059  GLUCAP 139* 230*    Radiological Exams on Admission: No results found.  Chest x-ray pending  EKG: Not done  Assessment/Plan  Acute bronchitis versus pneumonia Admitted to observation telemetry. Will obtain CBC and basic metabolic panel. Will  obtain a chest x-ray rule out pneumonia. Will order sputum culture and place her on empiric IV Levaquin. Will also start her on scheduled and as needed albuterol nebs.   UTI Urine culture growing gram-negative rods. Will follow up at sensitivity. On IV Levaquin which should cover the gram-negative rods as well.  Atrial fibrillation Continue telemetry monitoring. Patient is on Coumadin and will be dosed by pharmacy Patient is on both Cardizem and Lopressor.  Diabetes mellitus Patient on Humulin at home. Continue sliding scale insulin and novolog.  Diet: diabetic   Code Status: FULL Family Communication: Husband at bedside Disposition Plan: Home once stable  Louellen Molder [facility] Pager [phone]  If 7PM-7AM, please contact night-coverage [email or web address] Password [email or web address] 03/03/2012, 1:11 PM    Total  time spent: 70 minutes

## 2012-03-03 NOTE — Progress Notes (Signed)
Patient directly admitted from Dr. Ethel Rana office.  On admission noted that patient needed telemetry due to atrial fibrillation.  IV team notified of patient's need for venous access, due to difficult veins.  Marissa RN on telemetry updated on patient's status.  Durwin Nora RN

## 2012-03-03 NOTE — Progress Notes (Signed)
ANTICOAGULATION CONSULT NOTE - Initial Consult  Pharmacy Consult for Warfarin Indication: atrial fibrillation  Allergies  Allergen Reactions  . Procaine Hcl Anaphylaxis   Patient Measurements: Height: 5\' 9"  (175.3 cm) Weight: 180 lb (81.647 kg) IBW/kg (Calculated) : 66.2   Vital Signs: Temp: 97.5 F (36.4 C) (01/02 1402) Temp src: Oral (01/02 1234) BP: 176/60 mmHg (01/02 1402) Pulse Rate: 66  (01/02 1234)  Labs:  Basename 03/03/12 1704 03/03/12 1328 03/01/12 1055  HGB -- 12.2 12.4  HCT -- 34.4* 35.6*  PLT -- 320 274  APTT -- -- --  LABPROT 16.4* -- 13.1  INR 1.35 -- 1.00  HEPARINUNFRC -- -- --  CREATININE -- 1.11* 1.10  CKTOTAL -- -- --  CKMB -- -- --  TROPONINI -- -- --   Estimated Creatinine Clearance: 53.9 ml/min (by C-G formula based on Cr of 1.11).  Medical History: Past Medical History  Diagnosis Date  . Blindness of left eye   . Eye muscle weakness     Right eye weakness after cataract surgery  . Common migraine     ?hx of  . DIABETES MELLITUS, TYPE II 03/27/2006  . HYPERLIPIDEMIA 03/27/2006  . 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 HYPERTENSION 03/27/2006  . Atrial fibrillation     SVT dx 2007, cath 2007 mild CAD, then had an cardioversion, ablation; still on coumadin , has occ palpitation, EKG 03-2010 NSR  . Headache 05/07/2009  . LUNG NODULE 09/01/2006    Excision, Bx Benighn  . Osteopenia 2004    Dexa 2004 showed Osteopenia, DEXA 03/2007 normal  . Seizures    Medications:  Scheduled:    . albuterol  2.5 mg Nebulization Q6H  . aspirin EC  81 mg Oral Daily  . calcium-vitamin D  1 tablet Oral Daily  . diltiazem  240 mg Oral Daily  . famotidine  40 mg Oral Daily  . insulin aspart  0-15 Units Subcutaneous TID WC  . insulin aspart protamine-insulin aspart  30 Units Subcutaneous Q breakfast  . levofloxacin (LEVAQUIN) IV  750 mg Intravenous Q24H  . metoprolol tartrate  12.5 mg Oral BID  . multivitamin with minerals  1 tablet Oral Daily  . phenytoin   150 mg Oral TID  . simvastatin  20 mg Oral q1800  . sodium chloride  3 mL Intravenous Q12H  . sodium chloride  3 mL Intravenous Q12H    Assessment: 71 yoF admitted with SOB; bronchitis vs PNA. Was treated with Keflex and then Cipro prior to admit.  Hx of chronic Warfarin for AFib: home dose 2.5mg  daily exc 1.25mg  Th,Sa. Last dose noted 1/1  INR on 12/31 was 1.0, with Cipro started  INR today is 1.35, no apparent effect of cipro on INR  Quinolones can increase INR, currently on Levaquin.  Goal of Therapy:  INR 2-3   Plan:   Warfarin 2.5mg  tonight  Daily PT/INR  Minda Ditto PharmD Pager [phone] 03/03/2012,6:14 PM

## 2012-03-03 NOTE — Progress Notes (Signed)
Direct admission from PCP office. Mrs Callery presented with progressively worsening cough, wheezing and weakness. She was recently treated for UTI with Cipro. In PCP office she continues to wheeze and has crackles which was concerning for development of pneumonia. Due to patient's history of Atrial fibrillation (for which she is taking coumadin) we will tentatively assign telemetry floor.  Leisa Lenz [facility] [place] [id number] or [phone]

## 2012-03-04 DIAGNOSIS — I4891 Unspecified atrial fibrillation: Secondary | ICD-10-CM

## 2012-03-04 LAB — INFLUENZA PANEL BY PCR (TYPE A & B)
H1N1 flu by pcr: NOT DETECTED
Influenza B By PCR: NEGATIVE

## 2012-03-04 LAB — PROTIME-INR: INR: 1.38 (ref 0.00–1.49)

## 2012-03-04 LAB — GLUCOSE, CAPILLARY: Glucose-Capillary: 159 mg/dL — ABNORMAL HIGH (ref 70–99)

## 2012-03-04 LAB — URINE CULTURE: Colony Count: >=100000

## 2012-03-04 MED ORDER — LEVOFLOXACIN 500 MG PO TABS: 500.0000 mg | ORAL_TABLET | Freq: Every day | ORAL | Status: DC

## 2012-03-04 MED ORDER — GUAIFENESIN-DM 100-10 MG/5ML PO SYRP: 5.0000 mL | ORAL_SOLUTION | ORAL | Status: DC | PRN

## 2012-03-04 MED ORDER — LEVALBUTEROL HCL 0.63 MG/3ML IN NEBU
0.6300 mg | INHALATION_SOLUTION | RESPIRATORY_TRACT | Status: DC | PRN
  Filled 2012-03-04: qty 3

## 2012-03-04 MED ORDER — WARFARIN SODIUM 4 MG PO TABS
4.0000 mg | ORAL_TABLET | Freq: Once | ORAL | Status: DC
  Filled 2012-03-04: qty 1

## 2012-03-04 MED ORDER — ALBUTEROL SULFATE (5 MG/ML) 0.5% IN NEBU: 2.5000 mg | INHALATION_SOLUTION | Freq: Four times a day (QID) | RESPIRATORY_TRACT | Status: DC

## 2012-03-04 MED ORDER — LEVALBUTEROL HCL 0.63 MG/3ML IN NEBU
0.6300 mg | INHALATION_SOLUTION | Freq: Four times a day (QID) | RESPIRATORY_TRACT | Status: DC
  Administered 2012-03-04: 0.63 mg via RESPIRATORY_TRACT
  Filled 2012-03-04 (×5): qty 3

## 2012-03-04 MED ORDER — ATORVASTATIN CALCIUM 10 MG PO TABS
10.0000 mg | ORAL_TABLET | Freq: Every day | ORAL | Status: DC
  Filled 2012-03-04: qty 1

## 2012-03-04 MED ORDER — CIPROFLOXACIN HCL 500 MG PO TABS: 500.0000 mg | ORAL_TABLET | Freq: Two times a day (BID) | ORAL | Status: DC

## 2012-03-04 MED ORDER — ALBUTEROL SULFATE (5 MG/ML) 0.5% IN NEBU: 2.5000 mg | INHALATION_SOLUTION | RESPIRATORY_TRACT | Status: DC | PRN

## 2012-03-04 MED ORDER — WARFARIN SODIUM 2.5 MG PO TABS: 2.5000 mg | ORAL_TABLET | Freq: Every day | ORAL | Status: DC

## 2012-03-04 NOTE — Discharge Summary (Signed)
Physician Discharge Summary  [name] [name] [id number] DOB: Sep 13, 1940 DOA: 03/03/2012  PCP: [name], MD  Admit date: 03/03/2012 Discharge date: 03/04/2012  Time spent: 50  minutes  Recommendations for Outpatient Follow-up:  1. 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 Tissue informs that her husband had pneumonia about 2 weeks back and was discharged from the hospital. She then started having cough with occasional mucoid phlegm, shortness of breath with wheezing and generalized weakness with chills pus 5 days. She was seen by PCP and order for chest x-ray which was negative for pneumonia. However patient did not improve and symptoms. She she is being treated with ciprofloxacin for UTI.  This in by her PCP for direct admit with concerns for pneumonia.  On arrival to the medical floor patient informs the above-mentioned symptoms and then becomes shaky and starts to cry out loud. She didn't come down once her husband sat next to her.   Hospital Course:   Acute Bronchitis Patient has now improved. Will send her home on Robitussin DM CXR is negative  UTI Patient had urine culture growing pseudomonas sensitive to cipro. Will continue on cipro for seven more days.  Atrial fibrillation Heart rate is well controlled Will  increase the dose of warfarin to 2.5 mg po daily as the INR is sub therapeutic Follow up PCP in 3 days  Diabetes mellitus Continue humulin 70/30 at home.  Procedures:  None  Consultations:  None  Discharge Exam: Filed Vitals:   03/03/12 2110 03/03/12 2224 03/04/12 0450 03/04/12 0910  BP: 150/52  155/53   Pulse: 56 67 64   Temp: 97.6 F (36.4 C)  97.5 F (36.4 C)   TempSrc:   Oral   Resp: 18  18   Height:      Weight:      SpO2: 100%  98% 97%    General: Appear in no acute distress Cardiovascular: s1s2 RRR Respiratory: Soft, nontender Ext: No edema  Discharge Instructions  Discharge Orders    Future Appointments: Provider: Department: Dept Phone: Center:   04/20/2012 1:15 PM Renato Shin, MD [facility] [phone] None     Future Orders Please Complete By Expires   Diet - low sodium heart healthy      Increase activity slowly      Discharge instructions      Comments:   Follow up PCP in 3 days.       Medication List     As of 03/04/2012  2:27 PM    STOP taking these medications         cephALEXin 500 MG capsule   Commonly known as: KEFLEX               TAKE these medications         aspirin EC 81 MG tablet   Take 81  mg by mouth daily.      calcium-vitamin D 500-200 MG-UNIT per tablet   Commonly known as: OSCAL WITH D   Take 1 tablet by mouth daily.      diltiazem 240 MG 24 hr capsule   Commonly known as: CARDIZEM CD   Take 1 capsule (240 mg total) by mouth daily.      guaiFENesin-dextromethorphan 100-10 MG/5ML syrup   Commonly known as: ROBITUSSIN DM   Take 5 mLs by mouth every 4 (four) hours as needed for cough.      HUMULIN 70/30 (70-30) 100 UNIT/ML injection   Generic drug: insulin NPH-insulin regular   Inject 30 Units into the skin daily with breakfast.      Cipro   Commonly known as:   Take 1 tablet (500 mg total) by mouth daily.x 7 days      metoprolol tartrate 25 MG tablet   Commonly known as:  LOPRESSOR   Take 0.5 tablets (12.5 mg total) by mouth 2 (two) times daily.      multivitamin tablet   Take 1 tablet by mouth daily.      phenytoin 50 MG tablet   Commonly known as: DILANTIN   Chew 3 tablets (150 mg total) by mouth 3 (three) times daily.      pravastatin 40 MG tablet   Commonly known as: PRAVACHOL   Take 1 tablet (40 mg total) by mouth daily.      ranitidine 300 MG tablet   Commonly known as: ZANTAC   Take 1 tablet (300 mg total) by mouth at bedtime.      warfarin 2.5 MG tablet   Commonly known as: COUMADIN   Take 1 tablet (2.5 mg total) by mouth daily.          The results of significant diagnostics from this hospitalization (including imaging, microbiology, ancillary and laboratory) are listed below for reference.    Significant Diagnostic Studies: Dg Chest 2 View  03/03/2012  *RADIOLOGY REPORT*  Clinical Data: 73 year old female with shortness of breath and cough.  CHEST - 2 VIEW  Comparison: 03/01/2012 and prior chest radiographs dating back to 03/16/2005.  Findings: Upper limits normal heart size again noted. Mild peribronchial thickening and right basilar scarring again noted. Surgical clips in the right mediastinum again identified. There is no evidence of focal airspace disease, pulmonary edema, suspicious pulmonary nodule/mass, pleural effusion, or pneumothorax. No acute bony abnormalities are identified.  IMPRESSION: No evidence of acute cardiopulmonary disease.   Original Report Authenticated By: Margarette Canada, M.D.    Dg Chest 2 View  03/01/2012  *RADIOLOGY REPORT*  Clinical Data: Shortness of breath and cough.  CHEST - 2 VIEW  Comparison: 11/05/2011 and 05/26/2011.  Findings: The cardiac silhouette, mediastinal and hilar contours are within normal limits and stable.  Stable right middle lobe scarring changes.  No definite infiltrates, edema or effusions. The bony thorax is intact.  Remote rib fractures are noted.  IMPRESSION: Chronic lung changes with right  middle lobe scarring.  No definite acute overlying pulmonary process.   Original Report Authenticated By: Marijo Sanes, M.D.     Microbiology: Recent Results (from the past 240 hour(s))  URINE CULTURE     Status: Normal   Collection Time   03/01/12  1:30 PM      Component Value Range Status Comment   Specimen Description URINE, CATHETERIZED   Final    Special Requests NONE   Final    Culture  Setup Time 03/02/2012 02:49  Final    Colony Count >=100,000 COLONIES/ML   Final    Culture PSEUDOMONAS AERUGINOSA   Final    Report Status 03/04/2012 FINAL   Final    Organism ID, Bacteria PSEUDOMONAS AERUGINOSA   Final      Labs: Basic Metabolic Panel:  Lab [id number] 03/01/12 1055  NA 133* 134*  K 3.5 4.1  CL 95* 96  CO2 24 25  GLUCOSE 109* 271*  BUN 17 18  CREATININE 1.11* 1.10  CALCIUM 10.0 9.4  MG -- --  PHOS -- --   Liver Function Tests:  Lab 03/01/12 1055  AST 19  ALT 12  ALKPHOS 161*  BILITOT 0.3  PROT 7.4  ALBUMIN 3.6   No results found for this basename: LIPASE:5,AMYLASE:5 in the last 168 hours No results found for this basename: AMMONIA:5 in the last 168 hours CBC:  Lab 03/03/12 1328 03/01/12 1055  WBC 6.3 6.6  NEUTROABS 3.6 4.5  HGB 12.2 12.4  HCT 34.4* 35.6*  MCV 87.5 90.4  PLT 320 274   Cardiac Enzymes: No results found for this basename: CKTOTAL:5,CKMB:5,CKMBINDEX:5,TROPONINI:5 in the last 168 hours BNP: BNP (last 3 results) No results found for this basename: PROBNP:3 in the last 8760 hours CBG:  Lab 03/04/12 1146 03/04/12 0722 03/03/12 2106 03/03/12 1731 03/01/12 1313  GLUCAP 159* 191* 108* 205* 139*       Signed:  Faustine Tates S  [facility] 03/04/2012, 2:27 PM

## 2012-03-04 NOTE — Progress Notes (Signed)
Statin Therapy Pharmacy Update:  Patient is on Simvastatin 20mg  po daily and Diltiazem.  FDA recommends that simvastatin dose should not exceed 10mg /day if on concomitant diltiazem.  Pharmacy has substituted an equivalent dose of atorvastatin for simvastatin.  Please notify if you have any questions.  Thanks!  Ralene Bathe, PharmD 03/04/2012 1:59 PM  Pager: [phone]

## 2012-03-04 NOTE — Progress Notes (Signed)
ANTICOAGULATION CONSULT NOTE - Follow Up Consult  Pharmacy Consult for Coumadin Indication: atrial fibrillation  Allergies  Allergen Reactions  . Procaine Hcl Anaphylaxis    Patient Measurements: Height: 5\' 9"  (175.3 cm) Weight: 180 lb (81.647 kg) IBW/kg (Calculated) : 66.2   Vital Signs: Temp: 97.5 F (36.4 C) (01/03 0450) Temp src: Oral (01/03 0450) BP: 155/53 mmHg (01/03 0450) Pulse Rate: 64  (01/03 0450)  Labs:  Basename 03/04/12 0500 03/03/12 1704 03/03/12 1328 03/01/12 1055  HGB -- -- 12.2 12.4  HCT -- -- 34.4* 35.6*  PLT -- -- 320 274  APTT -- -- -- --  LABPROT 16.6* 16.4* -- 13.1  INR 1.38 1.35 -- 1.00  HEPARINUNFRC -- -- -- --  CREATININE -- -- 1.11* 1.10  CKTOTAL -- -- -- --  CKMB -- -- -- --  TROPONINI -- -- -- --    Estimated Creatinine Clearance: 53.9 ml/min (by C-G formula based on Cr of 1.11).   Medications:  Home dose Coumadin 2.5mg  daily except 1.25mg  Th,Sa  Assessment: 46 yoF on chronic coumadin for Afib.  Home dose noted above.  Admitted for SOB, wheezing, and weakness from PCP office.   On IV levofloxacin for bronchitis vs PNA.  Also s/p course ciprofloxacin for UTI.  Quinolones may enhance the anticoagulant effect of Coumadin.    Unclear why INR was 1.0 on 12/31.  Pt followed by [facility] - last INR in office was therapeutic.  INR currently 1.38 -subtherapeutic.  Will give boosted Coumadin dose tonight.   H/H, plts ok.  No bleeding reported.    Goal of Therapy:  INR 2-3 Monitor platelets by anticoagulation protocol: Yes   Plan:  1.  Coumadin 4 mg po x 1 tonight 2.  F/u daily PT/INR, CBC  Ralene Bathe, PharmD 03/04/2012 8:35 AM  Pager: [phone]

## 2012-03-04 NOTE — Progress Notes (Signed)
   CARE MANAGEMENT NOTE 03/04/2012  Patient:  [name] [name], [name] [name]   Account Number:  [id number]  Date Initiated:  03/04/2012  Documentation initiated by:  Olga Coaster  Subjective/Objective Assessment:   ADMITTED WITH BRONCHITIS VS PNEUMONIA     Action/Plan:   PCP: [name], MD  LIVES AT [place]   Anticipated DC Date:  03/11/2012   Anticipated DC Plan:  [place]  CM consult              Status of service:  In process, will continue to follow Medicare Important Message given?  NA - LOS <3 / Initial given by admissions (If response is "NO", the following Medicare IM given date fields will be blank)  Per UR Regulation:  Reviewed for med. necessity/level of care/duration of stay  Comments:  03/04/2012- B Mineola Duan RN,BSN,MHA

## 2012-03-06 ENCOUNTER — Telehealth: Payer: Self-pay | Admitting: Internal Medicine

## 2012-03-06 NOTE — Telephone Encounter (Signed)
Was admitted to the hospital, needs f/u Tuesday or Wednesday, please arrange, overbook ok

## 2012-03-07 NOTE — Telephone Encounter (Signed)
Pt scheduled appt 1.7.14

## 2012-03-08 ENCOUNTER — Telehealth: Payer: Self-pay

## 2012-03-08 ENCOUNTER — Encounter: Payer: Self-pay | Admitting: Internal Medicine

## 2012-03-08 ENCOUNTER — Ambulatory Visit (INDEPENDENT_AMBULATORY_CARE_PROVIDER_SITE_OTHER): Payer: Medicare Other | Admitting: Internal Medicine

## 2012-03-08 VITALS — BP 126/62 | HR 55 | Temp 98.1°F | Wt 184.0 lb

## 2012-03-08 DIAGNOSIS — Z7901 Long term (current) use of anticoagulants: Secondary | ICD-10-CM

## 2012-03-08 DIAGNOSIS — N390 Urinary tract infection, site not specified: Secondary | ICD-10-CM

## 2012-03-08 DIAGNOSIS — J209 Acute bronchitis, unspecified: Secondary | ICD-10-CM

## 2012-03-08 LAB — POCT INR: INR: 1.2

## 2012-03-08 MED ORDER — ALBUTEROL SULFATE HFA 108 (90 BASE) MCG/ACT IN AERS: 2.0000 | INHALATION_SPRAY | Freq: Four times a day (QID) | RESPIRATORY_TRACT | Status: DC | PRN

## 2012-03-08 NOTE — Assessment & Plan Note (Signed)
Up to a month ago she was taking: 2.5mg s daily except 1.25mg s on Sundays and Thursdays.  The hospital, INR was low, she was recommended to 0.5 mg daily however since she left the hospital 4 days ago she is taking 5 mg daily. INR today 1.2. Unclear why she is requiring higher doses of Coumadin, could be because she is on antibiotics. I also wonder about compliance before she went to the hospital. Plan: Continue 5 mg daily, will notify the Coumadin clinic for followup

## 2012-03-08 NOTE — Progress Notes (Signed)
  Subjective:    Patient ID: [name] [name], female    DOB: 01/18/1941, 72 y.o.   MRN: [id number]  HPI Hospital followup Was admitted to the hospital 03/03/2012 w/ suspected pneumonia. Chest x-ray, CBC, BMP were okay, she was dx w/ bronchitis and discharged in improved condition. Coumadin - INR initially 1.0, on 03/04/2012 was 1.3. Coumadin was increased to 2.5 mg daily. History UTI, cultures positive for Pseudomonas, on Cipro. Here for followup   Past Medical History:   Diabetes with neuropathy and retinopathy---> worse after B laser surgery (actually blind on the L)   Hyperlipidemia Hypertension Paroxysmal atrial fibrillation---> SVT dx 2007, cath 2007 mild CAD, then had an cardioversion, ablation; still on coumadin , has occ palpitation, EKG 03-2010 NSR   Lung nodule excision 2008, pathology benign. ? Osteopenia  DEXA 2004 showed osteopenia, DEXA 03-2007 normal   ? 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 She continue with dry cough and some shortness of breath but less than before. Wheezing has also decreased, she has no albuterol. She is actually taking Coumadin 2.5 mg 2 tablets daily, instructions were to take one tablet daily. Denies dysuria or gross hematuria    Objective:   Physical Exam  General -- alert, well-developed, no respiratory distress. Vital signs stable   Lungs -- few scattered rhonchi, no crackles. Slightly increased in the expiration time   Heart-- normal rate, regular rhythm, no murmur  Lower extremities -- no edema Psych-- Cognition and judgment appear intact. Alert and  cooperative with normal attention span and concentration.  not anxious appearing and not depressed appearing.       Assessment & Plan:

## 2012-03-08 NOTE — Telephone Encounter (Signed)
Spoke with pt she states she has the 2.5mg  tablet of Coumadin, and she states she has been taking 1 tablet daily, not 2 tablets.  Pt states she has not missed any doses of Coumadin.  Unsure really what dose of Coumadin she has actually been taking, or how compliant she is on taking them everyday.  2.5mg  daily is still a higher dosage than she has been therapeutic on in the past and she is taking Cipro.  Advised pt to take 3.75mg  (1.5 tablets) today and tomorrow, then resume 2.5mg  daily.  Scheduled f/u appt in clinic for 03/15/12, requested sooner f/u in clinic, but pt requested 03/15/12.

## 2012-03-08 NOTE — Assessment & Plan Note (Signed)
Recovering from acute bronchitis. Still has some rhonchi. Plan: Continue with Mucinex DM, prescribed albuterol  to use when necessary

## 2012-03-08 NOTE — Telephone Encounter (Signed)
Message copied by Theophilus Kinds on Tue Mar 08, 2012  5:14 PM ------      Message from: [name] E      Created: Tue Mar 08, 2012 11:05 AM      Regarding: needs INR in few days        good morning, I saw [name] [name] today, I check her INR -->  1.2; please  followup on her.       See my office visit note. Thank you !      JP

## 2012-03-08 NOTE — Assessment & Plan Note (Addendum)
Urine culture 02/27/2012 and  03/03/2011 showed Pseudomonas. asx  Currently on Cipro Plan: Drink plenty of fluids Finish Cipro We'll recheck a urine culture in 4 weeks when she comes back for a physical

## 2012-03-08 NOTE — Patient Instructions (Addendum)
Finish Cipro as prescribed Albuterol 2 puffs 4 times a day as needed for wheezing Continue with Robitussin-DM Continue taking Coumadin 2.5 mg ----> 2 tablets daily, please call the Coumadin clinic and schedule a followup. Your  INR today is 1.2. --- Come back in 4 weeks for a physical exam

## 2012-03-10 ENCOUNTER — Ambulatory Visit: Payer: Medicare Other | Admitting: Internal Medicine

## 2012-03-21 ENCOUNTER — Telehealth: Payer: Self-pay | Admitting: Internal Medicine

## 2012-03-21 NOTE — Telephone Encounter (Signed)
Receive a note from pharmacy: Currently on phenytoin 50 mg 3 tablets 3 times a day, generic phenytoin causing GI upset. She would like to be on Dilantin 100 mg 3 times a day and phenytoin 50 mg 3 times a day. I'm not opposed. Please send a new prescription

## 2012-03-22 MED ORDER — PHENYTOIN SODIUM EXTENDED 100 MG PO CAPS: 100.0000 mg | ORAL_CAPSULE | Freq: Three times a day (TID) | ORAL | Status: DC

## 2012-03-22 NOTE — Telephone Encounter (Signed)
Refill done.  

## 2012-03-23 ENCOUNTER — Ambulatory Visit (INDEPENDENT_AMBULATORY_CARE_PROVIDER_SITE_OTHER): Payer: Medicare Other | Admitting: *Deleted

## 2012-03-23 ENCOUNTER — Ambulatory Visit: Payer: Medicare Other

## 2012-03-23 DIAGNOSIS — I4892 Unspecified atrial flutter: Secondary | ICD-10-CM

## 2012-03-23 DIAGNOSIS — I4891 Unspecified atrial fibrillation: Secondary | ICD-10-CM

## 2012-03-23 DIAGNOSIS — Z7901 Long term (current) use of anticoagulants: Secondary | ICD-10-CM

## 2012-03-25 ENCOUNTER — Ambulatory Visit (INDEPENDENT_AMBULATORY_CARE_PROVIDER_SITE_OTHER): Payer: Medicare Other

## 2012-03-25 DIAGNOSIS — I4892 Unspecified atrial flutter: Secondary | ICD-10-CM

## 2012-03-25 DIAGNOSIS — Z7901 Long term (current) use of anticoagulants: Secondary | ICD-10-CM

## 2012-03-25 DIAGNOSIS — I4891 Unspecified atrial fibrillation: Secondary | ICD-10-CM

## 2012-04-01 ENCOUNTER — Ambulatory Visit (INDEPENDENT_AMBULATORY_CARE_PROVIDER_SITE_OTHER): Payer: Medicare Other | Admitting: *Deleted

## 2012-04-01 DIAGNOSIS — I4891 Unspecified atrial fibrillation: Secondary | ICD-10-CM

## 2012-04-01 DIAGNOSIS — Z7901 Long term (current) use of anticoagulants: Secondary | ICD-10-CM

## 2012-04-01 DIAGNOSIS — I4892 Unspecified atrial flutter: Secondary | ICD-10-CM

## 2012-04-01 LAB — POCT INR: INR: 1.5

## 2012-04-05 ENCOUNTER — Ambulatory Visit (INDEPENDENT_AMBULATORY_CARE_PROVIDER_SITE_OTHER): Payer: Medicare Other | Admitting: Internal Medicine

## 2012-04-05 ENCOUNTER — Encounter: Payer: Self-pay | Admitting: Internal Medicine

## 2012-04-05 VITALS — BP 126/72 | HR 52 | Temp 97.5°F | Wt 183.0 lb

## 2012-04-05 DIAGNOSIS — E785 Hyperlipidemia, unspecified: Secondary | ICD-10-CM

## 2012-04-05 DIAGNOSIS — R569 Unspecified convulsions: Secondary | ICD-10-CM

## 2012-04-05 DIAGNOSIS — G40909 Epilepsy, unspecified, not intractable, without status epilepticus: Secondary | ICD-10-CM

## 2012-04-05 DIAGNOSIS — J209 Acute bronchitis, unspecified: Secondary | ICD-10-CM

## 2012-04-05 DIAGNOSIS — N390 Urinary tract infection, site not specified: Secondary | ICD-10-CM

## 2012-04-05 DIAGNOSIS — I10 Essential (primary) hypertension: Secondary | ICD-10-CM

## 2012-04-05 LAB — URINALYSIS, ROUTINE W REFLEX MICROSCOPIC
Ketones, ur: NEGATIVE
Specific Gravity, Urine: <=1.005 (ref 1.000–1.030)
Urine Glucose: 500
Urobilinogen, UA: 0.2 (ref 0.0–1.0)
pH: 7 (ref 5.0–8.0)

## 2012-04-05 LAB — LIPID PANEL
Cholesterol: 176 mg/dL (ref 0–200)
HDL: 60.6 mg/dL (ref >39.00)
Triglycerides: 115 mg/dL (ref 0.0–149.0)

## 2012-04-05 NOTE — Assessment & Plan Note (Signed)
Reports her ambulatory BPs varies, see instructions

## 2012-04-05 NOTE — Assessment & Plan Note (Signed)
Will check FLP today.

## 2012-04-05 NOTE — Assessment & Plan Note (Signed)
Essentially resolved. 

## 2012-04-05 NOTE — Assessment & Plan Note (Signed)
Status post Cipro, recheck a urine culture

## 2012-04-05 NOTE — Progress Notes (Signed)
  Subjective:    Patient ID: [name] [name], female    DOB: Jul 22, 1940, 72 y.o.   MRN: [id number]  HPI  Followup from previous visit Bronchitis, symptoms eventually resolved. History of a seizure, good compliance with Dilantin, reports unintentional right thumb tremor. Concerned about it. Hypertension, good medication compliance, ambulatory BP varies but couldn't tell me any specific reading.  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  ? Osteopenia DEXA 2004 showed osteopenia, DEXA 03-2007 normal  ? 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 No nausea, vomiting, diarrhea. No dysuria gross hematuria.     Objective:   Physical Exam General -- alert, well-developed, and well-nourished.    Lungs -- normal respiratory effort, no intercostal retractions, no accessory muscle use, and normal breath sounds.   Heart-- normal rate, regular rhythm, no murmur, and no gallop.   Extremities-- no pretibial edema bilaterally  Neurologic-- no formal neurological exam performed today however I did observe tremor of intention at the right hand, more noticeable in the right thumb. She seems to be alert & oriented X3  Psych-- Cognition and judgment appear  intact. Alert and cooperative with normal attention span and concentration.  not anxious appearing and not depressed appearing.      Assessment & Plan:

## 2012-04-05 NOTE — Patient Instructions (Addendum)
Check the  blood pressure 2 or 3 times a week, be sure it is between 110/60 and 140/85. If it is consistently higher or lower, let me know Please come back in 4-5 months for a routine checkup

## 2012-04-05 NOTE — Assessment & Plan Note (Addendum)
History of Herpetic encephalopathy, respiratory failure, seizures 05-2011 Currently on Dilantin, seizure-free. See her neurologist from time to time, Dr. Leonie Man, next visit in about 5 months. Reports  Tremor, right hand, already eval by neurology.

## 2012-04-07 ENCOUNTER — Encounter: Payer: Self-pay | Admitting: *Deleted

## 2012-04-07 LAB — URINE CULTURE: Colony Count: 6000

## 2012-04-11 ENCOUNTER — Ambulatory Visit (INDEPENDENT_AMBULATORY_CARE_PROVIDER_SITE_OTHER): Payer: Medicare Other | Admitting: *Deleted

## 2012-04-11 DIAGNOSIS — I4891 Unspecified atrial fibrillation: Secondary | ICD-10-CM

## 2012-04-11 DIAGNOSIS — I4892 Unspecified atrial flutter: Secondary | ICD-10-CM

## 2012-04-11 DIAGNOSIS — Z7901 Long term (current) use of anticoagulants: Secondary | ICD-10-CM

## 2012-04-11 LAB — PROTIME-INR
INR: 10 ratio (ref 0.8–1.0)
Prothrombin Time: 101.6 s (ref 10.2–12.4)

## 2012-04-14 ENCOUNTER — Telehealth: Payer: Self-pay | Admitting: Pharmacist

## 2012-04-14 NOTE — Telephone Encounter (Signed)
Patient had appointment scheduled for 2/14 to get INR rechecked since INR=10 earlier this week. Pt had been told to take Vit K 2.5mg  x1 and not to take any coumadin until INR rechecked on Friday. Patient denied having any issues since then. Due to weather, clinic is closed 2/13 and 2/14, so patient will be unable to get level rechecked until Monday. Advised patient to start taking only 1/2 tablet daily starting Friday evening and will see [place] in clinic on Monday. Advised patient to seek medical attention if issue arises before then. Patient verbalized understanding

## 2012-04-18 ENCOUNTER — Ambulatory Visit (INDEPENDENT_AMBULATORY_CARE_PROVIDER_SITE_OTHER): Payer: Medicare Other | Admitting: Pharmacist

## 2012-04-18 DIAGNOSIS — Z7901 Long term (current) use of anticoagulants: Secondary | ICD-10-CM

## 2012-04-18 DIAGNOSIS — I4892 Unspecified atrial flutter: Secondary | ICD-10-CM

## 2012-04-18 DIAGNOSIS — I4891 Unspecified atrial fibrillation: Secondary | ICD-10-CM

## 2012-04-19 ENCOUNTER — Other Ambulatory Visit: Payer: Self-pay | Admitting: Internal Medicine

## 2012-04-19 NOTE — Telephone Encounter (Signed)
Refill done.  

## 2012-04-20 ENCOUNTER — Ambulatory Visit (INDEPENDENT_AMBULATORY_CARE_PROVIDER_SITE_OTHER): Payer: Medicare Other | Admitting: Endocrinology

## 2012-04-20 ENCOUNTER — Encounter: Payer: Self-pay | Admitting: Endocrinology

## 2012-04-20 VITALS — BP 140/64 | HR 68 | Temp 97.6°F | Resp 16 | Ht 68.0 in | Wt 185.0 lb

## 2012-04-20 DIAGNOSIS — E119 Type 2 diabetes mellitus without complications: Secondary | ICD-10-CM

## 2012-04-20 NOTE — Patient Instructions (Addendum)
blood tests are being requested for you today.  We'll contact you with results. Please return here in 3 months.   check your blood sugar 2 times a day.  vary the time of day when you check, between before the 3 meals, and at bedtime.  also check if you have symptoms of your blood sugar being too high or too low.  please keep a record of the readings and bring it to your next appointment here.  please call us sooner if you are having low blood sugar episodes.

## 2012-04-20 NOTE — Progress Notes (Signed)
  Subjective:    Patient ID: [name] [name], female    DOB: 12-21-1940, 72 y.o.   MRN: [id number]  HPI Pt returns for f/u of insulin-requiring DM (dx'ed 0000000; complicated by retinopathy and peripheral sensory neuropathy; therapy has been limited by pt's need for a simple, inexpensive insulin regimen; she had an episode of severe hypoglycemia in September, 2013).  No cbg record, but pt says cbg's are well-controlled.     Review of Systems Denies hypoglycemia    Objective:   Physical Exam VITAL SIGNS:  See vs page GENERAL: no distress Pulses: dorsalis pedis intact bilat.   Feet: no deformity.  no ulcer on the feet.  feet are of normal color and temp.  no edema.  There is bilateral onychomycosis Neuro: sensation is intact to touch on the feet, but decreased from normal   Lab Results  Component Value Date   HGBA1C 7.7* 01/19/2012      Assessment & Plan:

## 2012-04-21 NOTE — Progress Notes (Signed)
Subjective:    Patient ID: [name] [name], female    DOB: 07-13-1940, 72 y.o.   MRN: [id number]  HPI Pt returns for f/u of insulin-requiring DM (dx'ed 0000000; complicated by retinopathy and peripheral sensory neuropathy; therapy has been limited by pt's need for a simple, inexpensive insulin regimen; she had an episode of severe hypoglycemia in September, 2013). No cbg record, but pt says cbg's are well-controlled. There is no trend throughout the day. Past Medical History  Diagnosis Date  . Blindness of left eye   . Eye muscle weakness     Right eye weakness after cataract surgery  . Common migraine     ?hx of  . DIABETES MELLITUS, TYPE II 03/27/2006  . HYPERLIPIDEMIA 03/27/2006  . 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 HYPERTENSION 03/27/2006  . Atrial fibrillation     SVT dx 2007, cath 2007 mild CAD, then had an cardioversion, ablation; still on coumadin , has occ palpitation, EKG 03-2010 NSR  . Headache 05/07/2009  . LUNG NODULE 09/01/2006    Excision, Bx Benighn  . Osteopenia 2004    Dexa 2004 showed Osteopenia, DEXA 03/2007 normal  . Seizures     Past Surgical History  Procedure Laterality Date  . Shoulder surgery      History   Social History  . Marital Status: Married    Spouse Name: N/A    Number of Children: 1  . Years of Education: N/A   Occupational History  .     Social History Main Topics  . Smoking status: Former Smoker    Types: Cigarettes    Quit date: 03/02/1978  . Smokeless tobacco: Never Used  . Alcohol Use: No  . Drug Use: No  . Sexually Active: No   Other Topics Concern  . Not on file   Social History Narrative   Moved from [place] 2004   Tobacco--quit in the 80's    Current Outpatient Prescriptions on File Prior to Visit  Medication Sig Dispense Refill  . albuterol (VENTOLIN HFA) 108 (90 BASE) MCG/ACT inhaler Inhale 2 puffs into the lungs every 6 (six) hours as needed for wheezing.  1 Inhaler  1  . calcium-vitamin D (OSCAL WITH D) 500-200  MG-UNIT per tablet Take 1 tablet by mouth daily.      [name] diltiazem (CARDIZEM CD) 240 MG 24 hr capsule TAKE ONE CAPSULE BY MOUTH EVERY DAY  30 capsule  6  . insulin NPH-insulin regular (HUMULIN 70/30) (70-30) 100 UNIT/ML injection Inject 35 Units into the skin daily with breakfast.       . metoprolol tartrate (LOPRESSOR) 25 MG tablet Take 0.5 tablets (12.5 mg total) by mouth 2 (two) times daily.  60 tablet  12  . Multiple Vitamin (MULTIVITAMIN) tablet Take 1 tablet by mouth daily.        . phenytoin (DILANTIN) 100 MG ER capsule Take 300 mg by mouth daily.       . phenytoin (DILANTIN) 50 MG tablet Chew 50 mg by mouth 3 (three) times daily.      . phytonadione (VITAMIN K) 5 MG tablet Take 2.5 mg by mouth once. Take 1/2 tablet (2.5mg ) as soon as obtained and then save remaining 1/2 tablet      . pravastatin (PRAVACHOL) 40 MG tablet Take 1 tablet (40 mg total) by mouth daily.  30 tablet  12  . ranitidine (ZANTAC) 300 MG tablet Take 1 tablet (300 mg total) by mouth at bedtime.  30 tablet  3  .  warfarin (COUMADIN) 2.5 MG tablet Take 1 tablet (2.5 mg total) by mouth daily.  30 tablet  0  . aspirin EC 81 MG tablet Take 81 mg by mouth daily.       No current facility-administered medications on file prior to visit.    Allergies  Allergen Reactions  . Procaine Hcl Anaphylaxis    Family History  Problem Relation Age of Onset  . Diabetes Father   . Heart attack Father 87  . Diabetes Sister   . Cancer Neg Hx     no hx of colon or breast cancer    BP 140/64  Pulse 68  Temp(Src) 97.6 F (36.4 C) (Oral)  Resp 16  Ht 5\' 8"  (1.727 m)  Wt 185 lb (83.915 kg)  BMI 28.14 kg/m2  SpO2 96%  Review of Systems denies hypoglycemia.      Objective:   Physical Exam VITAL SIGNS:  See vs page GENERAL: no distress Pulses: dorsalis pedis intact bilat.   Feet: no deformity.  no ulcer on the feet.  feet are of normal color and temp.  no edema Neuro: sensation is intact to touch on the feet  Lab Results   Component Value Date   HGBA1C 8.1* 04/20/2012      Assessment & Plan:  DM: needs increased rx

## 2012-04-27 ENCOUNTER — Ambulatory Visit (INDEPENDENT_AMBULATORY_CARE_PROVIDER_SITE_OTHER): Payer: Medicare Other | Admitting: *Deleted

## 2012-04-27 DIAGNOSIS — Z7901 Long term (current) use of anticoagulants: Secondary | ICD-10-CM

## 2012-04-27 LAB — POCT INR: INR: 2.1

## 2012-04-28 ENCOUNTER — Telehealth: Payer: Self-pay | Admitting: Internal Medicine

## 2012-04-28 MED ORDER — PHENYTOIN SODIUM EXTENDED 100 MG PO CAPS: 300.0000 mg | ORAL_CAPSULE | Freq: Every day | ORAL | Status: DC

## 2012-04-28 NOTE — Telephone Encounter (Signed)
Refill done.  

## 2012-04-28 NOTE — Telephone Encounter (Signed)
refill  Phenytoin EX (Cap) 100 MG Take 300 mg by mouth daily.  -- hand wrt note from pharmacy--"Pt is requesting a 90-day supply" last fill 2.27.14 (per fax)

## 2012-04-30 DIAGNOSIS — B004 Herpesviral encephalitis: Secondary | ICD-10-CM

## 2012-04-30 HISTORY — DX: Herpesviral encephalitis: B00.4

## 2012-05-09 ENCOUNTER — Ambulatory Visit (INDEPENDENT_AMBULATORY_CARE_PROVIDER_SITE_OTHER): Payer: Medicare Other | Admitting: Pharmacist

## 2012-05-09 DIAGNOSIS — Z7901 Long term (current) use of anticoagulants: Secondary | ICD-10-CM

## 2012-05-09 DIAGNOSIS — I4891 Unspecified atrial fibrillation: Secondary | ICD-10-CM

## 2012-05-09 LAB — POCT INR: INR: 2

## 2012-06-03 ENCOUNTER — Telehealth: Payer: Self-pay | Admitting: Endocrinology

## 2012-06-03 MED ORDER — INSULIN NPH ISOPHANE & REGULAR (70-30) 100 UNIT/ML ~~LOC~~ SUSP: 35.0000 [IU] | Freq: Every day | SUBCUTANEOUS | Status: DC

## 2012-06-03 NOTE — Telephone Encounter (Signed)
Pt needs refill on insulin, she was unsure of name. She says she has one script that the insulin costs about 95.00 but Dr. [name] had written for something less expensive and it only costs about 25.00 - she was unsure of the name of the insulin. Uses [employer] / [name] [name]

## 2012-06-03 NOTE — Telephone Encounter (Signed)
rx sent to pharmacy

## 2012-06-03 NOTE — Telephone Encounter (Signed)
Med list is correct.  Please refill prn.

## 2012-06-06 ENCOUNTER — Ambulatory Visit (INDEPENDENT_AMBULATORY_CARE_PROVIDER_SITE_OTHER): Payer: Medicare Other | Admitting: Pharmacist

## 2012-06-06 DIAGNOSIS — I4892 Unspecified atrial flutter: Secondary | ICD-10-CM

## 2012-06-06 DIAGNOSIS — Z7901 Long term (current) use of anticoagulants: Secondary | ICD-10-CM

## 2012-06-06 DIAGNOSIS — I4891 Unspecified atrial fibrillation: Secondary | ICD-10-CM

## 2012-06-20 ENCOUNTER — Encounter: Payer: Self-pay | Admitting: *Deleted

## 2012-06-20 ENCOUNTER — Ambulatory Visit (INDEPENDENT_AMBULATORY_CARE_PROVIDER_SITE_OTHER): Payer: Medicare Other | Admitting: *Deleted

## 2012-06-20 DIAGNOSIS — I4891 Unspecified atrial fibrillation: Secondary | ICD-10-CM

## 2012-06-20 DIAGNOSIS — Z7901 Long term (current) use of anticoagulants: Secondary | ICD-10-CM

## 2012-06-20 DIAGNOSIS — I4892 Unspecified atrial flutter: Secondary | ICD-10-CM

## 2012-06-20 LAB — POCT INR: INR: 1.4

## 2012-06-30 ENCOUNTER — Ambulatory Visit (INDEPENDENT_AMBULATORY_CARE_PROVIDER_SITE_OTHER): Payer: Medicare Other

## 2012-06-30 DIAGNOSIS — I4891 Unspecified atrial fibrillation: Secondary | ICD-10-CM

## 2012-06-30 DIAGNOSIS — Z7901 Long term (current) use of anticoagulants: Secondary | ICD-10-CM

## 2012-06-30 DIAGNOSIS — I4892 Unspecified atrial flutter: Secondary | ICD-10-CM

## 2012-06-30 LAB — PROTIME-INR: Prothrombin Time: >120 s (ref 10.2–12.4)

## 2012-06-30 LAB — POCT INR: INR: >8

## 2012-07-01 ENCOUNTER — Telehealth: Payer: Self-pay | Admitting: *Deleted

## 2012-07-01 MED ORDER — PHYTONADIONE 5 MG PO TABS: ORAL_TABLET | ORAL | Status: DC

## 2012-07-01 NOTE — Telephone Encounter (Signed)
Call from pts husband that they thought had Vitamin K  1/2 tablet but did not. Called to [facility] closed at present. [place] and they have Vitamin K tablet and Vitamin K ordered. This nurse called pt and informed that [facility] has Vitamin k and instructed her to obtain as soon as possible and take 1/2 tablet and same the remaining 1/2 tablet and she states understanding.

## 2012-07-04 ENCOUNTER — Ambulatory Visit (INDEPENDENT_AMBULATORY_CARE_PROVIDER_SITE_OTHER): Payer: Medicare Other | Admitting: Pharmacist

## 2012-07-04 DIAGNOSIS — I4892 Unspecified atrial flutter: Secondary | ICD-10-CM

## 2012-07-04 DIAGNOSIS — I4891 Unspecified atrial fibrillation: Secondary | ICD-10-CM

## 2012-07-04 DIAGNOSIS — Z7901 Long term (current) use of anticoagulants: Secondary | ICD-10-CM

## 2012-07-11 ENCOUNTER — Ambulatory Visit (INDEPENDENT_AMBULATORY_CARE_PROVIDER_SITE_OTHER): Payer: Medicare Other | Admitting: *Deleted

## 2012-07-11 DIAGNOSIS — I4892 Unspecified atrial flutter: Secondary | ICD-10-CM

## 2012-07-11 DIAGNOSIS — I4891 Unspecified atrial fibrillation: Secondary | ICD-10-CM

## 2012-07-11 DIAGNOSIS — Z7901 Long term (current) use of anticoagulants: Secondary | ICD-10-CM

## 2012-07-11 LAB — POCT INR: INR: 2.8

## 2012-07-13 ENCOUNTER — Emergency Department (HOSPITAL_COMMUNITY): Payer: Medicare Other

## 2012-07-13 ENCOUNTER — Inpatient Hospital Stay (HOSPITAL_COMMUNITY)
Admission: EM | Admit: 2012-07-13 | Discharge: 2012-07-20 | DRG: 481 | Disposition: A | Discharge status: Skilled Nursing Facility | Payer: Medicare Other | Attending: Internal Medicine | Admitting: Internal Medicine

## 2012-07-13 ENCOUNTER — Encounter (HOSPITAL_COMMUNITY): Payer: Self-pay | Admitting: Emergency Medicine

## 2012-07-13 DIAGNOSIS — Y93H2 Activity, gardening and landscaping: Secondary | ICD-10-CM

## 2012-07-13 DIAGNOSIS — S72001A Fracture of unspecified part of neck of right femur, initial encounter for closed fracture: Secondary | ICD-10-CM

## 2012-07-13 DIAGNOSIS — E876 Hypokalemia: Secondary | ICD-10-CM

## 2012-07-13 DIAGNOSIS — Z794 Long term (current) use of insulin: Secondary | ICD-10-CM

## 2012-07-13 DIAGNOSIS — Z8249 Family history of ischemic heart disease and other diseases of the circulatory system: Secondary | ICD-10-CM

## 2012-07-13 DIAGNOSIS — N390 Urinary tract infection, site not specified: Secondary | ICD-10-CM

## 2012-07-13 DIAGNOSIS — H3500 Unspecified background retinopathy: Secondary | ICD-10-CM

## 2012-07-13 DIAGNOSIS — J984 Other disorders of lung: Secondary | ICD-10-CM

## 2012-07-13 DIAGNOSIS — E871 Hypo-osmolality and hyponatremia: Secondary | ICD-10-CM

## 2012-07-13 DIAGNOSIS — D62 Acute posthemorrhagic anemia: Secondary | ICD-10-CM | POA: Diagnosis not present

## 2012-07-13 DIAGNOSIS — S72143A Displaced intertrochanteric fracture of unspecified femur, initial encounter for closed fracture: Principal | ICD-10-CM | POA: Diagnosis present

## 2012-07-13 DIAGNOSIS — Z7982 Long term (current) use of aspirin: Secondary | ICD-10-CM

## 2012-07-13 DIAGNOSIS — K5900 Constipation, unspecified: Secondary | ICD-10-CM | POA: Diagnosis not present

## 2012-07-13 DIAGNOSIS — S72141A Displaced intertrochanteric fracture of right femur, initial encounter for closed fracture: Secondary | ICD-10-CM

## 2012-07-13 DIAGNOSIS — I440 Atrioventricular block, first degree: Secondary | ICD-10-CM | POA: Diagnosis present

## 2012-07-13 DIAGNOSIS — I10 Essential (primary) hypertension: Secondary | ICD-10-CM

## 2012-07-13 DIAGNOSIS — G40909 Epilepsy, unspecified, not intractable, without status epilepticus: Secondary | ICD-10-CM | POA: Diagnosis present

## 2012-07-13 DIAGNOSIS — Z7901 Long term (current) use of anticoagulants: Secondary | ICD-10-CM

## 2012-07-13 DIAGNOSIS — S72009A Fracture of unspecified part of neck of unspecified femur, initial encounter for closed fracture: Secondary | ICD-10-CM

## 2012-07-13 DIAGNOSIS — W010XXA Fall on same level from slipping, tripping and stumbling without subsequent striking against object, initial encounter: Secondary | ICD-10-CM | POA: Diagnosis present

## 2012-07-13 DIAGNOSIS — I4891 Unspecified atrial fibrillation: Secondary | ICD-10-CM | POA: Diagnosis present

## 2012-07-13 DIAGNOSIS — Z01810 Encounter for preprocedural cardiovascular examination: Secondary | ICD-10-CM

## 2012-07-13 DIAGNOSIS — G609 Hereditary and idiopathic neuropathy, unspecified: Secondary | ICD-10-CM

## 2012-07-13 DIAGNOSIS — M899 Disorder of bone, unspecified: Secondary | ICD-10-CM

## 2012-07-13 DIAGNOSIS — W19XXXA Unspecified fall, initial encounter: Secondary | ICD-10-CM

## 2012-07-13 DIAGNOSIS — K219 Gastro-esophageal reflux disease without esophagitis: Secondary | ICD-10-CM

## 2012-07-13 DIAGNOSIS — H5440 Blindness, one eye, unspecified eye: Secondary | ICD-10-CM | POA: Diagnosis present

## 2012-07-13 DIAGNOSIS — Z79899 Other long term (current) drug therapy: Secondary | ICD-10-CM

## 2012-07-13 DIAGNOSIS — E119 Type 2 diabetes mellitus without complications: Secondary | ICD-10-CM | POA: Diagnosis present

## 2012-07-13 DIAGNOSIS — Y92009 Unspecified place in unspecified non-institutional (private) residence as the place of occurrence of the external cause: Secondary | ICD-10-CM

## 2012-07-13 DIAGNOSIS — Z833 Family history of diabetes mellitus: Secondary | ICD-10-CM

## 2012-07-13 DIAGNOSIS — Z87891 Personal history of nicotine dependence: Secondary | ICD-10-CM

## 2012-07-13 DIAGNOSIS — E785 Hyperlipidemia, unspecified: Secondary | ICD-10-CM | POA: Diagnosis present

## 2012-07-13 HISTORY — DX: Displaced intertrochanteric fracture of right femur, initial encounter for closed fracture: S72.141A

## 2012-07-13 LAB — BASIC METABOLIC PANEL
Chloride: 97 mEq/L (ref 96–112)
Creatinine, Ser: 0.93 mg/dL (ref 0.50–1.10)
GFR calc Af Amer: 71 mL/min — ABNORMAL LOW (ref >90)
Potassium: 3.4 mEq/L — ABNORMAL LOW (ref 3.5–5.1)
Sodium: 137 mEq/L (ref 135–145)

## 2012-07-13 LAB — CBC WITH DIFFERENTIAL/PLATELET
Basophils Absolute: 0 10*3/uL (ref 0.0–0.1)
Basophils Relative: 0 % (ref 0–1)
MCHC: 34.8 g/dL (ref 30.0–36.0)
Neutro Abs: 5.6 10*3/uL (ref 1.7–7.7)
Neutrophils Relative %: 68 % (ref 43–77)
Platelets: 255 10*3/uL (ref 150–400)
RDW: 12.3 % (ref 11.5–15.5)

## 2012-07-13 LAB — PROTIME-INR: Prothrombin Time: 20.1 seconds — ABNORMAL HIGH (ref 11.6–15.2)

## 2012-07-13 MED ORDER — ONDANSETRON HCL 4 MG/2ML IJ SOLN
4.0000 mg | Freq: Once | INTRAMUSCULAR | Status: AC
  Administered 2012-07-13: 4 mg via INTRAVENOUS
  Filled 2012-07-13: qty 2

## 2012-07-13 MED ORDER — MORPHINE SULFATE 4 MG/ML IJ SOLN
4.0000 mg | Freq: Once | INTRAMUSCULAR | Status: AC
  Administered 2012-07-13: 4 mg via INTRAVENOUS
  Filled 2012-07-13: qty 1

## 2012-07-13 MED ORDER — LORAZEPAM 2 MG/ML IJ SOLN
1.0000 mg | Freq: Once | INTRAMUSCULAR | Status: AC
  Administered 2012-07-13: 1 mg via INTRAVENOUS
  Filled 2012-07-13: qty 1

## 2012-07-13 NOTE — H&P (Signed)
[facility] History and Physical  [name] [name] [id number] DOB: 11-08-1940 DOA: 07/13/2012  Referring physician: er PCP: [name], MD  Specialists: ortho  Chief Complaint: fall and hip pain  HPI: [place] is a 72 y.o. female  Who just received morphine and ativan and unable to give hx.  Hx obtained from family and ER records.  Patient was outside doing yard work earlier today when she lost her balance and fell.  She hit her shoulder and hip.  C/o pain in her hip.  She did not have a seizure, did not hit her head, did not lose consciousness.   In the Er, she was found to have a right femur intertrochanteric fracture.  Ortho consulted and plans for surgery in AM.   No fracture of shoulder and CT scan head/neckshowed no acute issues    Review of Systems: unable to do full ROS as patient was given medication and was sleepy   Past Medical History  Diagnosis Date  . Blindness of left eye   . Eye muscle weakness     Right eye weakness after cataract surgery  . Common migraine     ?hx of  . DIABETES MELLITUS, TYPE II 03/27/2006  . HYPERLIPIDEMIA 03/27/2006  . 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 HYPERTENSION 03/27/2006  . Atrial fibrillation     SVT dx 2007, cath 2007 mild CAD, then had an cardioversion, ablation; still on coumadin , has occ palpitation, EKG 03-2010 NSR  . Headache 05/07/2009  . LUNG NODULE 09/01/2006    Excision, Bx Benighn  . Osteopenia 2004    Dexa 2004 showed Osteopenia, DEXA 03/2007 normal  . Seizures    Past Surgical History  Procedure Laterality Date  . Shoulder surgery     Social History:  reports that she quit smoking about 34 years ago. Her smoking use included Cigarettes. She smoked 0.00 packs per day. She has never used smokeless tobacco. She reports that she does not drink alcohol or use illicit drugs.   Allergies  Allergen Reactions  . Procaine Hcl Anaphylaxis    Family History  Problem Relation Age of Onset  . Diabetes  Father   . Heart attack Father 61  . Diabetes Sister   . Cancer Neg Hx     no hx of colon or breast cancer    Prior to Admission medications   Medication Sig Start Date End Date Taking? Authorizing Provider  albuterol (VENTOLIN HFA) 108 (90 BASE) MCG/ACT inhaler Inhale 2 puffs into the lungs every 6 (six) hours as needed for wheezing. 03/08/12  Yes Colon Branch, MD  aspirin EC 81 MG tablet Take 81 mg by mouth daily.   Yes Historical Provider, MD  calcium-vitamin D (OSCAL WITH D) 500-200 MG-UNIT per tablet Take 1 tablet by mouth daily.   Yes Historical Provider, MD  diltiazem (CARDIZEM CD) 240 MG 24 hr capsule Take 240 mg by mouth daily.   Yes Historical Provider, MD  insulin NPH-regular (HUMULIN 70/30) (70-30) 100 UNIT/ML injection Inject 35 Units into the skin daily with breakfast. 06/03/12  Yes Renato Shin, MD  metoprolol tartrate (LOPRESSOR) 25 MG tablet Take 0.5 tablets (12.5 mg total) by mouth 2 (two) times daily. 07/29/11  Yes Colon Branch, MD  Multiple Vitamin (MULTIVITAMIN WITH MINERALS) TABS Take 1 tablet by mouth daily.   Yes Historical Provider, MD  phenytoin (DILANTIN) 100 MG ER capsule Take 100 mg by mouth 3 (three) times daily. Taken with 50mg  tablet per dose  to equal 150mg .   Yes Historical Provider, MD  phenytoin (DILANTIN) 50 MG tablet Chew 50 mg by mouth 3 (three) times daily. Taken with 100mg  capsule per dose to equal 150mg . 11/17/11 11/16/12 Yes Colon Branch, MD  pravastatin (PRAVACHOL) 40 MG tablet Take 1 tablet (40 mg total) by mouth daily. 07/29/11  Yes Colon Branch, MD  ranitidine (ZANTAC) 300 MG tablet Take 1 tablet (300 mg total) by mouth at bedtime. 10/05/11 10/04/12 Yes Colon Branch, MD  warfarin (COUMADIN) 2.5 MG tablet Take 1.25-2.5 mg by mouth daily. 0.5 tab daily except for 1 tab on Fridays.   Yes Historical Provider, MD   Physical Exam: Filed Vitals:   07/13/12 1842  BP: 145/61  Pulse: 66  Temp: 97.7 F (36.5 C)  TempSrc: Oral  Resp: 16  Weight: 79.379 kg (175 lb)  SpO2:  96%     General:  Sleepy, even respirations- NAD  Eyes: wnl  ENT: wnl  Neck: supple  Cardiovascular: irregular  Respiratory: clear, no wheezing  Abdomen: +Bs, soft, NT  Skin: no rashes or lesions- few bruised  Musculoskeletal: right leg rotated and shortened  Psychiatric: sleeping  Neurologic: no focal deficit  Labs on Admission:  Basic Metabolic Panel:  Recent Labs Lab 07/13/12 1935  NA 137  K 3.4*  CL 97  CO2 27  GLUCOSE 80  BUN 15  CREATININE 0.93  CALCIUM 9.4   Liver Function Tests: No results found for this basename: AST, ALT, ALKPHOS, BILITOT, PROT, ALBUMIN,  in the last 168 hours No results found for this basename: LIPASE, AMYLASE,  in the last 168 hours No results found for this basename: AMMONIA,  in the last 168 hours CBC:  Recent Labs Lab 07/13/12 1935  WBC 8.2  NEUTROABS 5.6  HGB 11.8*  HCT 33.9*  MCV 89.4  PLT 255   Cardiac Enzymes: No results found for this basename: CKTOTAL, CKMB, CKMBINDEX, TROPONINI,  in the last 168 hours  BNP (last 3 results) No results found for this basename: PROBNP,  in the last 8760 hours CBG:  Recent Labs Lab 07/13/12 2132  GLUCAP 85    Radiological Exams on Admission: Dg Chest 2 View  07/13/2012   *RADIOLOGY REPORT*  Clinical Data: Fall, right rib pain.  CHEST - 2 VIEW  Comparison: 03/03/2012  Findings: Old healed right sixth rib fracture.  No acute rib fracture.  Heart is enlarged.  No confluent airspace opacities in the lungs.  Mild interstitial prominence.  The lateral view is suboptimal as the patient's arm is down by her side.  No visible effusions.  IMPRESSION: No acute findings.  No visible acute rib fracture.  Old healed right sixth rib fracture.   Original Report Authenticated By: Rolm Baptise, M.D.   Dg Shoulder Right  07/13/2012   *RADIOLOGY REPORT*  Clinical Data: Fall, right shoulder pain.  RIGHT SHOULDER - 2+ VIEW  Comparison: 08/12/2011  Findings: Degenerative changes in the right [facility] and  glenohumeral joint.  Loss of some acromial space compatible with rotator cuff disease.  No fracture, subluxation or dislocation.  Old healed right sixth rib fracture.  IMPRESSION: No acute bony abnormality.  Degenerative changes.   Original Report Authenticated By: Rolm Baptise, M.D.   Dg Hip Complete Right  07/13/2012   *RADIOLOGY REPORT*  Clinical Data: Fall and hip pain.  RIGHT HIP - COMPLETE 2+ VIEW  Comparison: None.  Findings: There is a mildly displaced fracture involving the proximal right femur.  This appears to  represent an intertrochanteric fracture.  The right hip is located. Pelvic bony ring is intact.  IMPRESSION: Mildly displaced right femoral intertrochanteric fracture.   Original Report Authenticated By: Markus Daft, M.D.   Ct Head Wo Contrast  07/13/2012   *RADIOLOGY REPORT*  Clinical Data:  Fall.  Injury to head and neck.  Fracture tip.  CT HEAD WITHOUT CONTRAST CT CERVICAL SPINE WITHOUT CONTRAST  Technique:  Multidetector CT imaging of the head and cervical spine was performed following the standard protocol without intravenous contrast.  Multiplanar CT image reconstructions of the cervical spine were also generated.  Comparison:  11/05/2011  CT HEAD  Findings: Old area of encephalomalacia in the left temporal lobe. No acute infarction.  No hemorrhage or hydrocephalus.  No mass effect midline shift.  No extra-axial fluid collection.  No acute calvarial abnormality. Visualized paranasal sinuses and mastoids clear.  Orbital soft tissues unremarkable.  IMPRESSION: Stable left temporal encephalomalacia. No acute intracranial abnormality.  CT CERVICAL SPINE  Findings: Degenerative disc and facet disease throughout the cervical spine, most pronounced in the mid and lower cervical spine.  Fusion across the C6-7 disc space.  Prevertebral soft tissues are normal.  No fracture.  No epidural or paraspinal hematoma.  IMPRESSION: Degenerative changes.  Fusion at C6-7.  No acute findings.   Original Report  Authenticated By: Rolm Baptise, M.D.   Ct Cervical Spine Wo Contrast  07/13/2012   *RADIOLOGY REPORT*  Clinical Data:  Fall.  Injury to head and neck.  Fracture tip.  CT HEAD WITHOUT CONTRAST CT CERVICAL SPINE WITHOUT CONTRAST  Technique:  Multidetector CT imaging of the head and cervical spine was performed following the standard protocol without intravenous contrast.  Multiplanar CT image reconstructions of the cervical spine were also generated.  Comparison:  11/05/2011  CT HEAD  Findings: Old area of encephalomalacia in the left temporal lobe. No acute infarction.  No hemorrhage or hydrocephalus.  No mass effect midline shift.  No extra-axial fluid collection.  No acute calvarial abnormality. Visualized paranasal sinuses and mastoids clear.  Orbital soft tissues unremarkable.  IMPRESSION: Stable left temporal encephalomalacia. No acute intracranial abnormality.  CT CERVICAL SPINE  Findings: Degenerative disc and facet disease throughout the cervical spine, most pronounced in the mid and lower cervical spine.  Fusion across the C6-7 disc space.  Prevertebral soft tissues are normal.  No fracture.  No epidural or paraspinal hematoma.  IMPRESSION: Degenerative changes.  Fusion at C6-7.  No acute findings.   Original Report Authenticated By: Rolm Baptise, M.D.    EKG: Independently reviewed. pending  Assessment/Plan Principal Problem:   Hip fracture requiring operative repair Active Problems:   Atrial fibrillation   Seizure disorder   DM (diabetes mellitus)   Fall   Diabetes   Hypokalemia   1. Fall with femoral intertrochanteric fracture- right- hip fracture pathway- NPO after midnight, plan for surgery in AM- trend INR 2. A fib- rate controlled- heparin gtt for bridging, EKG pending (very active at home- no SOB/CP with activity) 3. seizure d/o- continue home med- check level, will give small dose of vit K 4. DM- hold 70/30- lantus and SSI 5. Fall- PT eval 6. Hypokalemia- replace  Ortho in  ER  Code Status: full Family Communication:  Disposition Plan: admit  Time spent: 70 min  Lauri Till [facility] Pager [phone]  If 7PM-7AM, please contact night-coverage [email or web address] Password [facility] 07/13/2012, 10:45 PM

## 2012-07-13 NOTE — ED Provider Notes (Signed)
  Medical screening examination/treatment/procedure(s) were performed by non-physician practitioner and as supervising physician I was immediately available for consultation/collaboration.  On my exam the patient was very uncomfortable appearing.  I discussed the results of the x-ray with the patient and her family members.  I saw the ECG (if appropriate), relevant labs and studies - I agree with the interpretation of notable for a right intratrochanteric fracture.Carmin Muskrat, MD 07/13/12 2329

## 2012-07-13 NOTE — ED Provider Notes (Signed)
History     CSN: [id number]  Arrival date & time 07/13/12  [id number]   First MD Initiated Contact with Patient 07/13/12 1848      Chief Complaint  Patient presents with  . Fall    (Consider location/radiation/quality/duration/timing/severity/associated sxs/prior treatment) HPI  [name] [name] is a 72 y.o. female  with a hx of migraine, diabetes, hyperlipidemia, hypertension, A. fib on Coumadin, seizures, osteopenia presents to the Emergency Department complaining of acute onset, persistent right hip and shoulder pain after fall in the ER approximately 30 minutes prior to arrival. Patient was doing yard work when she lost her balance falling and her right shoulder and hip. Patient states she did not hit her head or have a loss of consciousness. She denies neck or back pain. Associated symptoms include decreased mobility of the right lower extremity and pain. Sentinel given by EMS makes it better and movement and palpation makes it worse.  Pt denies fever, chills, headache neck pain, back pain, chest pain, shortness of breath, abdominal pain, nausea, vomiting, diarrhea, dizziness, syncope, dysuria, hematuria.     Past Medical History  Diagnosis Date  . Blindness of left eye   . Eye muscle weakness     Right eye weakness after cataract surgery  . Common migraine     ?hx of  . DIABETES MELLITUS, TYPE II 03/27/2006  . HYPERLIPIDEMIA 03/27/2006  . 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 HYPERTENSION 03/27/2006  . Atrial fibrillation     SVT dx 2007, cath 2007 mild CAD, then had an cardioversion, ablation; still on coumadin , has occ palpitation, EKG 03-2010 NSR  . Headache 05/07/2009  . LUNG NODULE 09/01/2006    Excision, Bx Benighn  . Osteopenia 2004    Dexa 2004 showed Osteopenia, DEXA 03/2007 normal  . Seizures     Past Surgical History  Procedure Laterality Date  . Shoulder surgery      Family History  Problem Relation Age of Onset  . Diabetes Father   . Heart attack Father 68   . Diabetes Sister   . Cancer Neg Hx     no hx of colon or breast cancer    History  Substance Use Topics  . Smoking status: Former Smoker    Types: Cigarettes    Quit date: 03/02/1978  . Smokeless tobacco: Never Used  . Alcohol Use: No    OB History   Grav Para Term Preterm Abortions TAB SAB Ect Mult Living                  Review of Systems  Constitutional: Negative for fever, diaphoresis, appetite change, fatigue and unexpected weight change.  HENT: Negative for mouth sores, neck pain and neck stiffness.   Eyes: Negative for visual disturbance.  Respiratory: Negative for cough, chest tightness, shortness of breath and wheezing.   Cardiovascular: Negative for chest pain.  Gastrointestinal: Negative for nausea, vomiting, abdominal pain, diarrhea and constipation.  Genitourinary: Negative for dysuria, urgency, frequency and hematuria.  Musculoskeletal: Positive for joint swelling, arthralgias and gait problem. Negative for back pain.  Skin: Negative for rash and wound.  Allergic/Immunologic: Negative for immunocompromised state.  Neurological: Negative for syncope, light-headedness, numbness and headaches.  Hematological: Does not bruise/bleed easily.  Psychiatric/Behavioral: Negative for sleep disturbance. The patient is not nervous/anxious.   All other systems reviewed and are negative.    Allergies  Procaine hcl  Home Medications   Current Outpatient Rx  Name  Route  Sig  Dispense  Refill  .  albuterol (VENTOLIN HFA) 108 (90 BASE) MCG/ACT inhaler   Inhalation   Inhale 2 puffs into the lungs every 6 (six) hours as needed for wheezing.   1 Inhaler   1   . aspirin EC 81 MG tablet   Oral   Take 81 mg by mouth daily.         . calcium-vitamin D (OSCAL WITH D) 500-200 MG-UNIT per tablet   Oral   Take 1 tablet by mouth daily.         [name] diltiazem (CARDIZEM CD) 240 MG 24 hr capsule   Oral   Take 240 mg by mouth daily.         . insulin NPH-regular  (HUMULIN 70/30) (70-30) 100 UNIT/ML injection   Subcutaneous   Inject 35 Units into the skin daily with breakfast.   10 mL   3   . metoprolol tartrate (LOPRESSOR) 25 MG tablet   Oral   Take 0.5 tablets (12.5 mg total) by mouth 2 (two) times daily.   60 tablet   12   . Multiple Vitamin (MULTIVITAMIN WITH MINERALS) TABS   Oral   Take 1 tablet by mouth daily.         . phenytoin (DILANTIN) 100 MG ER capsule   Oral   Take 100 mg by mouth 3 (three) times daily. Taken with 50mg  tablet per dose to equal 150mg .         . phenytoin (DILANTIN) 50 MG tablet   Oral   Chew 50 mg by mouth 3 (three) times daily. Taken with 100mg  capsule per dose to equal 150mg .         . pravastatin (PRAVACHOL) 40 MG tablet   Oral   Take 1 tablet (40 mg total) by mouth daily.   30 tablet   12   . ranitidine (ZANTAC) 300 MG tablet   Oral   Take 1 tablet (300 mg total) by mouth at bedtime.   30 tablet   3   . warfarin (COUMADIN) 2.5 MG tablet   Oral   Take 1.25-2.5 mg by mouth daily. 0.5 tab daily except for 1 tab on Fridays.           BP 145/61  Pulse 66  Temp(Src) 97.7 F (36.5 C) (Oral)  Resp 16  Wt 175 lb (79.379 kg)  BMI 26.61 kg/m2  SpO2 96%  Physical Exam  Nursing note and vitals reviewed. Constitutional: She is oriented to person, place, and time. She appears well-developed and well-nourished. No distress.  HENT:  Head: Normocephalic and atraumatic.  Mouth/Throat: Oropharynx is clear and moist. No oropharyngeal exudate.  Eyes: Conjunctivae and EOM are normal. Pupils are equal, round, and reactive to light. No scleral icterus.  Neck: Normal range of motion and full passive range of motion without pain. Neck supple. No spinous process tenderness and no muscular tenderness present. No rigidity. Normal range of motion present.  Cardiovascular: Normal rate, regular rhythm, normal heart sounds and intact distal pulses.   No murmur heard. Capillary refill < 3 sec   Pulmonary/Chest: Effort normal and breath sounds normal. No respiratory distress. She has no wheezes. She has no rales. She exhibits tenderness.  Pain to palpation of the right ribs  Abdominal: Soft. Bowel sounds are normal. She exhibits no distension and no mass. There is no tenderness. There is no rebound and no guarding.  Musculoskeletal: She exhibits tenderness. She exhibits no edema.       Right shoulder: She exhibits decreased range  of motion (2/2 pain), tenderness (mild) and pain. She exhibits no bony tenderness, no swelling, no effusion, no crepitus, no deformity, no laceration and no spasm.       Right hip: She exhibits decreased range of motion, decreased strength, tenderness and bony tenderness.  ROM: absent in the right hip; mild shortening and rotation of the right leg  Pain to palpation of the right shoulder with movement intact, but mildly reduced ROM 2/2 pain; full ROM of the right hand, wrist and elbow, small area of ecchymosis over the right humerus  Lymphadenopathy:    She has no cervical adenopathy.  Neurological: She is alert and oriented to person, place, and time. No cranial nerve deficit. She exhibits normal muscle tone. Coordination normal.  Sensation intact to dull and sharp Strength significantly decreased in the RLE, intact in the LLE  Skin: Skin is warm and dry. No rash noted. She is not diaphoretic. No erythema.  No tenting of the skin  Psychiatric: She has a normal mood and affect.    ED Course  Procedures (including critical care time)  Labs Reviewed  CBC WITH DIFFERENTIAL - Abnormal; Notable for the following:    RBC 3.79 (*)    Hemoglobin 11.8 (*)    HCT 33.9 (*)    Monocytes Relative 13 (*)    Monocytes Absolute 1.1 (*)    All other components within normal limits  BASIC METABOLIC PANEL - Abnormal; Notable for the following:    Potassium 3.4 (*)    GFR calc non Af Amer 61 (*)    GFR calc Af Amer 71 (*)    All other components within normal limits   PROTIME-INR - Abnormal; Notable for the following:    Prothrombin Time 20.1 (*)    INR 1.78 (*)    All other components within normal limits   Dg Chest 2 View  07/13/2012   *RADIOLOGY REPORT*  Clinical Data: Fall, right rib pain.  CHEST - 2 VIEW  Comparison: 03/03/2012  Findings: Old healed right sixth rib fracture.  No acute rib fracture.  Heart is enlarged.  No confluent airspace opacities in the lungs.  Mild interstitial prominence.  The lateral view is suboptimal as the patient's arm is down by her side.  No visible effusions.  IMPRESSION: No acute findings.  No visible acute rib fracture.  Old healed right sixth rib fracture.   Original Report Authenticated By: Rolm Baptise, M.D.   Dg Shoulder Right  07/13/2012   *RADIOLOGY REPORT*  Clinical Data: Fall, right shoulder pain.  RIGHT SHOULDER - 2+ VIEW  Comparison: 08/12/2011  Findings: Degenerative changes in the right [facility] and glenohumeral joint.  Loss of some acromial space compatible with rotator cuff disease.  No fracture, subluxation or dislocation.  Old healed right sixth rib fracture.  IMPRESSION: No acute bony abnormality.  Degenerative changes.   Original Report Authenticated By: Rolm Baptise, M.D.   Dg Hip Complete Right  07/13/2012   *RADIOLOGY REPORT*  Clinical Data: Fall and hip pain.  RIGHT HIP - COMPLETE 2+ VIEW  Comparison: None.  Findings: There is a mildly displaced fracture involving the proximal right femur.  This appears to represent an intertrochanteric fracture.  The right hip is located. Pelvic bony ring is intact.  IMPRESSION: Mildly displaced right femoral intertrochanteric fracture.   Original Report Authenticated By: Markus Daft, M.D.   Ct Head Wo Contrast  07/13/2012   *RADIOLOGY REPORT*  Clinical Data:  Fall.  Injury to head and neck.  Fracture tip.  CT HEAD WITHOUT CONTRAST CT CERVICAL SPINE WITHOUT CONTRAST  Technique:  Multidetector CT imaging of the head and cervical spine was performed following the standard protocol  without intravenous contrast.  Multiplanar CT image reconstructions of the cervical spine were also generated.  Comparison:  11/05/2011  CT HEAD  Findings: Old area of encephalomalacia in the left temporal lobe. No acute infarction.  No hemorrhage or hydrocephalus.  No mass effect midline shift.  No extra-axial fluid collection.  No acute calvarial abnormality. Visualized paranasal sinuses and mastoids clear.  Orbital soft tissues unremarkable.  IMPRESSION: Stable left temporal encephalomalacia. No acute intracranial abnormality.  CT CERVICAL SPINE  Findings: Degenerative disc and facet disease throughout the cervical spine, most pronounced in the mid and lower cervical spine.  Fusion across the C6-7 disc space.  Prevertebral soft tissues are normal.  No fracture.  No epidural or paraspinal hematoma.  IMPRESSION: Degenerative changes.  Fusion at C6-7.  No acute findings.   Original Report Authenticated By: Rolm Baptise, M.D.   Ct Cervical Spine Wo Contrast  07/13/2012   *RADIOLOGY REPORT*  Clinical Data:  Fall.  Injury to head and neck.  Fracture tip.  CT HEAD WITHOUT CONTRAST CT CERVICAL SPINE WITHOUT CONTRAST  Technique:  Multidetector CT imaging of the head and cervical spine was performed following the standard protocol without intravenous contrast.  Multiplanar CT image reconstructions of the cervical spine were also generated.  Comparison:  11/05/2011  CT HEAD  Findings: Old area of encephalomalacia in the left temporal lobe. No acute infarction.  No hemorrhage or hydrocephalus.  No mass effect midline shift.  No extra-axial fluid collection.  No acute calvarial abnormality. Visualized paranasal sinuses and mastoids clear.  Orbital soft tissues unremarkable.  IMPRESSION: Stable left temporal encephalomalacia. No acute intracranial abnormality.  CT CERVICAL SPINE  Findings: Degenerative disc and facet disease throughout the cervical spine, most pronounced in the mid and lower cervical spine.  Fusion across  the C6-7 disc space.  Prevertebral soft tissues are normal.  No fracture.  No epidural or paraspinal hematoma.  IMPRESSION: Degenerative changes.  Fusion at C6-7.  No acute findings.   Original Report Authenticated By: Rolm Baptise, M.D.     1. Fall at home, initial encounter   2. Intertrochanteric fracture, right, closed, initial encounter    ECG:  Date: 07/13/2012  Rate: 68  Rhythm: normal sinus rhythm  QRS Axis: normal  Intervals: PR prolonged  ST/T Wave abnormalities: normal  Conduction Disutrbances:first-degree A-V block   Narrative Interpretation: nonischemic ECG, unchanged from previous  Old EKG Reviewed: unchanged     MDM  [name] [name] presents after fall at home with right hip and shoulder pain. Right lower extremity with rotation and mild shortening of the leg, concern for hip fracture. Patient with movement of her shoulder though painful; will image along with head and neck CT.  Patient with mild anemia at 11.8 and mild hypokalemia at 3.4, labs are otherwise unremarkable.  X-rays of shoulder without acute bony abnormality noting only degenerative changes. Chest x-ray without acute rib fractures. Hip x-ray with mildly displaced right femoral intertrochanteric fracture.  Pt initially refusing head CT, but will be evaluated now. Patient without focal neurologic deficit on exam and I doubt head bleed, but with fall and anticoagulation I believe the head CT is needed. Discussed with Dr Ronnie Derby who will repair the hip tomorrow. Pt to be admitted by [facility].    PT/INR therapeutic; head and cervical spine CT negative for  acute injuries.  Dr. Carmin Muskrat was consulted, evaluated this patient with me and agrees with the plan.   [name] Soho Natilee Gauer, PA-C 07/13/12 [place], PA-C 07/13/12 [id number]

## 2012-07-13 NOTE — Consult Note (Signed)
NAME: [name] [name] MRN:   [id number] DOB:   [birth year]   CHIEF COMPLAINT:  Right hip pain  HISTORY:   [name] [name] a 72 y.o. female  with right  Hip Pain Patient complains of right hip pain. Onset of the symptoms was today. Inciting event: fell while at home.. The patient reports the hip pain is worse with weight bearing. Associated symptoms: none. Aggravating symptoms include: any weight bearing. Patient has had no prior hip problems. Previous visits for this problem: none. Evaluation to date: none. Treatment to date: none.     PAST MEDICAL HISTORY:   Past Medical History  Diagnosis Date  . Blindness of left eye   . Eye muscle weakness     Right eye weakness after cataract surgery  . Common migraine     ?hx of  . DIABETES MELLITUS, TYPE II 03/27/2006  . HYPERLIPIDEMIA 03/27/2006  . 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 HYPERTENSION 03/27/2006  . Atrial fibrillation     SVT dx 2007, cath 2007 mild CAD, then had an cardioversion, ablation; still on coumadin , has occ palpitation, EKG 03-2010 NSR  . Headache 05/07/2009  . LUNG NODULE 09/01/2006    Excision, Bx Benighn  . Osteopenia 2004    Dexa 2004 showed Osteopenia, DEXA 03/2007 normal  . Seizures     PAST SURGICAL HISTORY:   Past Surgical History  Procedure Laterality Date  . Shoulder surgery      MEDICATIONS:   (Not in a hospital admission)  ALLERGIES:   Allergies  Allergen Reactions  . Procaine Hcl Anaphylaxis    REVIEW OF SYSTEMS:   History obtained from spouse  FAMILY HISTORY:   Family History  Problem Relation Age of Onset  . Diabetes Father   . Heart attack Father 28  . Diabetes Sister   . Cancer Neg Hx     no hx of colon or breast cancer    SOCIAL HISTORY:   reports that she quit smoking about 34 years ago. Her smoking use included Cigarettes. She smoked 0.00 packs per day. She has never used smokeless tobacco. She reports that she does not drink alcohol or use illicit drugs.  PHYSICAL EXAM:   General appearance: alert    LABORATORY STUDIES:  Recent Labs  07/13/12 1935  WBC 8.2  HGB 11.8*  HCT 33.9*  PLT 255     Recent Labs  07/13/12 1935  NA 137  K 3.4*  CL 97  CO2 27  GLUCOSE 80  BUN 15  CREATININE 0.93  CALCIUM 9.4    STUDIES/RESULTS:  Dg Chest 2 View  07/13/2012   *RADIOLOGY REPORT*  Clinical Data: Fall, right rib pain.  CHEST - 2 VIEW  Comparison: 03/03/2012  Findings: Old healed right sixth rib fracture.  No acute rib fracture.  Heart is enlarged.  No confluent airspace opacities in the lungs.  Mild interstitial prominence.  The lateral view is suboptimal as the patient's arm is down by her side.  No visible effusions.  IMPRESSION: No acute findings.  No visible acute rib fracture.  Old healed right sixth rib fracture.   Original Report Authenticated By: Rolm Baptise, M.D.   Dg Shoulder Right  07/13/2012   *RADIOLOGY REPORT*  Clinical Data: Fall, right shoulder pain.  RIGHT SHOULDER - 2+ VIEW  Comparison: 08/12/2011  Findings: Degenerative changes in the right [facility] and glenohumeral joint.  Loss of some acromial space compatible with rotator cuff disease.  No fracture, subluxation or dislocation.  Old  healed right sixth rib fracture.  IMPRESSION: No acute bony abnormality.  Degenerative changes.   Original Report Authenticated By: Rolm Baptise, M.D.   Dg Hip Complete Right  07/13/2012   *RADIOLOGY REPORT*  Clinical Data: Fall and hip pain.  RIGHT HIP - COMPLETE 2+ VIEW  Comparison: None.  Findings: There is a mildly displaced fracture involving the proximal right femur.  This appears to represent an intertrochanteric fracture.  The right hip is located. Pelvic bony ring is intact.  IMPRESSION: Mildly displaced right femoral intertrochanteric fracture.   Original Report Authenticated By: Markus Daft, M.D.   Ct Head Wo Contrast  07/13/2012   *RADIOLOGY REPORT*  Clinical Data:  Fall.  Injury to head and neck.  Fracture tip.  CT HEAD WITHOUT CONTRAST CT CERVICAL SPINE  WITHOUT CONTRAST  Technique:  Multidetector CT imaging of the head and cervical spine was performed following the standard protocol without intravenous contrast.  Multiplanar CT image reconstructions of the cervical spine were also generated.  Comparison:  11/05/2011  CT HEAD  Findings: Old area of encephalomalacia in the left temporal lobe. No acute infarction.  No hemorrhage or hydrocephalus.  No mass effect midline shift.  No extra-axial fluid collection.  No acute calvarial abnormality. Visualized paranasal sinuses and mastoids clear.  Orbital soft tissues unremarkable.  IMPRESSION: Stable left temporal encephalomalacia. No acute intracranial abnormality.  CT CERVICAL SPINE  Findings: Degenerative disc and facet disease throughout the cervical spine, most pronounced in the mid and lower cervical spine.  Fusion across the C6-7 disc space.  Prevertebral soft tissues are normal.  No fracture.  No epidural or paraspinal hematoma.  IMPRESSION: Degenerative changes.  Fusion at C6-7.  No acute findings.   Original Report Authenticated By: Rolm Baptise, M.D.   Ct Cervical Spine Wo Contrast  07/13/2012   *RADIOLOGY REPORT*  Clinical Data:  Fall.  Injury to head and neck.  Fracture tip.  CT HEAD WITHOUT CONTRAST CT CERVICAL SPINE WITHOUT CONTRAST  Technique:  Multidetector CT imaging of the head and cervical spine was performed following the standard protocol without intravenous contrast.  Multiplanar CT image reconstructions of the cervical spine were also generated.  Comparison:  11/05/2011  CT HEAD  Findings: Old area of encephalomalacia in the left temporal lobe. No acute infarction.  No hemorrhage or hydrocephalus.  No mass effect midline shift.  No extra-axial fluid collection.  No acute calvarial abnormality. Visualized paranasal sinuses and mastoids clear.  Orbital soft tissues unremarkable.  IMPRESSION: Stable left temporal encephalomalacia. No acute intracranial abnormality.  CT CERVICAL SPINE  Findings:  Degenerative disc and facet disease throughout the cervical spine, most pronounced in the mid and lower cervical spine.  Fusion across the C6-7 disc space.  Prevertebral soft tissues are normal.  No fracture.  No epidural or paraspinal hematoma.  IMPRESSION: Degenerative changes.  Fusion at C6-7.  No acute findings.   Original Report Authenticated By: Rolm Baptise, M.D.    ASSESSMENT: Right subtrochanteric Fracture  PLAN: Needs to be cleared and INR needs to come down.  Then, will take to OR for fixation.    Shiloh Swopes,STEPHEN D 07/13/2012. 10:53 PM

## 2012-07-13 NOTE — ED Notes (Signed)
Per EMS: pt fell in the backyard falling backwards onto right shoulder/side. C/o right shoulder and hip pain. 100 mcg of Fentanyl in 20 g in left forearm.

## 2012-07-13 NOTE — ED Notes (Signed)
[id number] Expected date:<BR> Expected time:<BR> Means of arrival:<BR> Comments:<BR> Ems,elderly fall, right hip and shoulder pain

## 2012-07-14 DIAGNOSIS — Y92009 Unspecified place in unspecified non-institutional (private) residence as the place of occurrence of the external cause: Secondary | ICD-10-CM

## 2012-07-14 DIAGNOSIS — Z01810 Encounter for preprocedural cardiovascular examination: Secondary | ICD-10-CM

## 2012-07-14 DIAGNOSIS — I10 Essential (primary) hypertension: Secondary | ICD-10-CM

## 2012-07-14 DIAGNOSIS — I4891 Unspecified atrial fibrillation: Secondary | ICD-10-CM

## 2012-07-14 LAB — PROTIME-INR: Prothrombin Time: 20.8 seconds — ABNORMAL HIGH (ref 11.6–15.2)

## 2012-07-14 LAB — URINALYSIS, ROUTINE W REFLEX MICROSCOPIC
Hgb urine dipstick: NEGATIVE
Leukocytes, UA: NEGATIVE
Nitrite: NEGATIVE
Protein, ur: NEGATIVE mg/dL
Urobilinogen, UA: 0.2 mg/dL (ref 0.0–1.0)

## 2012-07-14 LAB — BASIC METABOLIC PANEL
BUN: 15 mg/dL (ref 6–23)
Calcium: 8.3 mg/dL — ABNORMAL LOW (ref 8.4–10.5)
GFR calc Af Amer: 75 mL/min — ABNORMAL LOW (ref >90)
GFR calc non Af Amer: 65 mL/min — ABNORMAL LOW (ref >90)
Glucose, Bld: 228 mg/dL — ABNORMAL HIGH (ref 70–99)

## 2012-07-14 LAB — GLUCOSE, CAPILLARY
Glucose-Capillary: 197 mg/dL — ABNORMAL HIGH (ref 70–99)
Glucose-Capillary: 166 mg/dL — ABNORMAL HIGH (ref 70–99)
Glucose-Capillary: 253 mg/dL — ABNORMAL HIGH (ref 70–99)
Glucose-Capillary: 135 mg/dL — ABNORMAL HIGH (ref 70–99)

## 2012-07-14 LAB — HEMOGLOBIN A1C: Hgb A1c MFr Bld: 6.7 % — ABNORMAL HIGH (ref <5.7)

## 2012-07-14 LAB — CBC
HCT: 30.6 % — ABNORMAL LOW (ref 36.0–46.0)
Hemoglobin: 10.3 g/dL — ABNORMAL LOW (ref 12.0–15.0)
RBC: 3.42 MIL/uL — ABNORMAL LOW (ref 3.87–5.11)
WBC: 10 10*3/uL (ref 4.0–10.5)

## 2012-07-14 LAB — PHENYTOIN LEVEL, TOTAL: Phenytoin Lvl: 23.6 ug/mL — ABNORMAL HIGH (ref 10.0–20.0)

## 2012-07-14 LAB — ABO/RH: ABO/RH(D): A NEG

## 2012-07-14 MED ORDER — POTASSIUM CHLORIDE CRYS ER 20 MEQ PO TBCR
40.0000 meq | EXTENDED_RELEASE_TABLET | Freq: Once | ORAL | Status: AC
  Administered 2012-07-14: 40 meq via ORAL
  Filled 2012-07-14: qty 2

## 2012-07-14 MED ORDER — HEPARIN (PORCINE) IN NACL 100-0.45 UNIT/ML-% IJ SOLN
1000.0000 [IU]/h | INTRAMUSCULAR | Status: DC
  Administered 2012-07-14 (×2): 1000 [IU]/h via INTRAVENOUS
  Filled 2012-07-14 (×2): qty 250

## 2012-07-14 MED ORDER — SIMVASTATIN 20 MG PO TABS
20.0000 mg | ORAL_TABLET | Freq: Every day | ORAL | Status: DC
  Filled 2012-07-14: qty 1

## 2012-07-14 MED ORDER — DIAZEPAM 5 MG/ML IJ SOLN: 5.0000 mg | Freq: Four times a day (QID) | INTRAMUSCULAR | Status: DC | PRN

## 2012-07-14 MED ORDER — HYDROCODONE-ACETAMINOPHEN 5-325 MG PO TABS: 1.0000 | ORAL_TABLET | ORAL | Status: DC | PRN

## 2012-07-14 MED ORDER — DILTIAZEM HCL ER COATED BEADS 240 MG PO CP24
240.0000 mg | ORAL_CAPSULE | Freq: Every day | ORAL | Status: DC
  Administered 2012-07-14 – 2012-07-20 (×6): 240 mg via ORAL
  Filled 2012-07-14 (×7): qty 1

## 2012-07-14 MED ORDER — FAMOTIDINE 20 MG PO TABS
20.0000 mg | ORAL_TABLET | Freq: Every day | ORAL | Status: DC
  Administered 2012-07-14 – 2012-07-16 (×2): 20 mg via ORAL
  Filled 2012-07-14 (×3): qty 1

## 2012-07-14 MED ORDER — CALCIUM CARBONATE-VITAMIN D 500-200 MG-UNIT PO TABS
1.0000 | ORAL_TABLET | Freq: Every day | ORAL | Status: DC
  Administered 2012-07-14 – 2012-07-20 (×6): 1 via ORAL
  Filled 2012-07-14 (×7): qty 1

## 2012-07-14 MED ORDER — HYDROCODONE-ACETAMINOPHEN 5-325 MG PO TABS: 1.0000 | ORAL_TABLET | Freq: Four times a day (QID) | ORAL | Status: DC | PRN

## 2012-07-14 MED ORDER — PHYTONADIONE 5 MG PO TABS
2.5000 mg | ORAL_TABLET | Freq: Once | ORAL | Status: AC
  Administered 2012-07-14: 2.5 mg via ORAL
  Filled 2012-07-14: qty 1

## 2012-07-14 MED ORDER — SODIUM CHLORIDE 0.9 % IV SOLN
INTRAVENOUS | Status: DC
  Administered 2012-07-14 (×2): via INTRAVENOUS

## 2012-07-14 MED ORDER — MORPHINE SULFATE 2 MG/ML IJ SOLN
1.0000 mg | INTRAMUSCULAR | Status: DC | PRN
  Administered 2012-07-14 (×2): 1 mg via INTRAVENOUS
  Filled 2012-07-14: qty 1

## 2012-07-14 MED ORDER — METOPROLOL TARTRATE 12.5 MG HALF TABLET
12.5000 mg | ORAL_TABLET | Freq: Two times a day (BID) | ORAL | Status: DC
  Administered 2012-07-14 – 2012-07-20 (×12): 12.5 mg via ORAL
  Filled 2012-07-14 (×15): qty 1

## 2012-07-14 MED ORDER — ADULT MULTIVITAMIN W/MINERALS CH
1.0000 | ORAL_TABLET | Freq: Every day | ORAL | Status: DC
  Administered 2012-07-14 – 2012-07-20 (×6): 1 via ORAL
  Filled 2012-07-14 (×7): qty 1

## 2012-07-14 MED ORDER — PHENYTOIN SODIUM EXTENDED 100 MG PO CAPS
100.0000 mg | ORAL_CAPSULE | Freq: Three times a day (TID) | ORAL | Status: DC
  Filled 2012-07-14 (×3): qty 1

## 2012-07-14 MED ORDER — ONDANSETRON HCL 4 MG/2ML IJ SOLN: 4.0000 mg | Freq: Four times a day (QID) | INTRAMUSCULAR | Status: DC | PRN

## 2012-07-14 MED ORDER — INSULIN ASPART 100 UNIT/ML ~~LOC~~ SOLN
0.0000 [IU] | SUBCUTANEOUS | Status: DC
  Administered 2012-07-14: 3 [IU] via SUBCUTANEOUS
  Administered 2012-07-14: 4 [IU] via SUBCUTANEOUS
  Administered 2012-07-14: 7 [IU] via SUBCUTANEOUS
  Administered 2012-07-14 – 2012-07-15 (×4): 4 [IU] via SUBCUTANEOUS
  Administered 2012-07-16: 7 [IU] via SUBCUTANEOUS
  Administered 2012-07-16 (×2): 4 [IU] via SUBCUTANEOUS
  Administered 2012-07-16: 11 [IU] via SUBCUTANEOUS
  Administered 2012-07-16: 4 [IU] via SUBCUTANEOUS
  Administered 2012-07-16: 7 [IU] via SUBCUTANEOUS
  Administered 2012-07-17: 11 [IU] via SUBCUTANEOUS
  Administered 2012-07-17: 4 [IU] via SUBCUTANEOUS
  Administered 2012-07-17: 7 [IU] via SUBCUTANEOUS
  Administered 2012-07-17: 20 [IU] via SUBCUTANEOUS
  Administered 2012-07-17: 4 [IU] via SUBCUTANEOUS

## 2012-07-14 MED ORDER — INSULIN GLARGINE 100 UNIT/ML ~~LOC~~ SOLN
15.0000 [IU] | Freq: Every day | SUBCUTANEOUS | Status: DC
  Administered 2012-07-14 – 2012-07-15 (×2): 15 [IU] via SUBCUTANEOUS
  Filled 2012-07-14 (×4): qty 0.15

## 2012-07-14 MED ORDER — INSULIN ASPART 100 UNIT/ML ~~LOC~~ SOLN: 0.0000 [IU] | Freq: Every day | SUBCUTANEOUS | Status: DC

## 2012-07-14 MED ORDER — ALBUTEROL SULFATE HFA 108 (90 BASE) MCG/ACT IN AERS
2.0000 | INHALATION_SPRAY | Freq: Four times a day (QID) | RESPIRATORY_TRACT | Status: DC | PRN
  Filled 2012-07-14: qty 6.7

## 2012-07-14 MED ORDER — PRAVASTATIN SODIUM 40 MG PO TABS
40.0000 mg | ORAL_TABLET | Freq: Every day | ORAL | Status: DC
  Administered 2012-07-14 – 2012-07-19 (×6): 40 mg via ORAL
  Filled 2012-07-14 (×7): qty 1

## 2012-07-14 MED ORDER — PHENYTOIN 50 MG PO CHEW
50.0000 mg | CHEWABLE_TABLET | Freq: Three times a day (TID) | ORAL | Status: DC
  Filled 2012-07-14 (×3): qty 1

## 2012-07-14 MED ORDER — MORPHINE SULFATE 2 MG/ML IJ SOLN
0.5000 mg | INTRAMUSCULAR | Status: DC | PRN
  Administered 2012-07-14 (×2): 0.5 mg via INTRAVENOUS
  Filled 2012-07-14 (×2): qty 1

## 2012-07-14 MED ORDER — ASPIRIN EC 81 MG PO TBEC
81.0000 mg | DELAYED_RELEASE_TABLET | Freq: Every day | ORAL | Status: DC
  Administered 2012-07-16 – 2012-07-20 (×5): 81 mg via ORAL
  Filled 2012-07-14 (×7): qty 1

## 2012-07-14 MED ORDER — INSULIN ASPART 100 UNIT/ML ~~LOC~~ SOLN: 0.0000 [IU] | Freq: Three times a day (TID) | SUBCUTANEOUS | Status: DC

## 2012-07-14 MED ORDER — LORAZEPAM 1 MG PO TABS
1.0000 mg | ORAL_TABLET | Freq: Four times a day (QID) | ORAL | Status: DC | PRN
  Administered 2012-07-15: 1 mg via ORAL
  Filled 2012-07-14: qty 1

## 2012-07-14 MED ORDER — BISACODYL 5 MG PO TBEC
5.0000 mg | DELAYED_RELEASE_TABLET | Freq: Every day | ORAL | Status: DC | PRN
  Administered 2012-07-17: 5 mg via ORAL
  Filled 2012-07-14: qty 1

## 2012-07-14 NOTE — Progress Notes (Signed)
Patient transported via [facility] to [facility]. Alert & verbal with awareness of transfer. Medicated with 1mg  morphine IV prior transfer. Heparin infusion in progress at 10cc/hour.

## 2012-07-14 NOTE — Progress Notes (Signed)
Patient to transfer to room# 21C at [facility] from room # 1604 at [place] hip surgery scheduled in a.m. with Dr. Mardelle Matte. Report called to receiving nurse named Larkin Ina @ [address]. Spoke to [place] at [place] for patient transport to [employer] [facility].

## 2012-07-14 NOTE — Progress Notes (Deleted)
Clinical Social Work Department CLINICAL SOCIAL WORK PLACEMENT NOTE 07/14/2012  Patient:  [name] [name]  Account Number:  [id number] Admit date:  07/11/2012  Clinical Social Worker:  Werner Lean, LCSW  Date/time:  07/12/2012 10:50 AM  Clinical Social Work is seeking post-discharge placement for this patient at the following level of care:   SKILLED NURSING   (*CSW will update this form in Epic as items are completed)     Patient/family provided with [place] Department of Clinical Social Work's list of facilities offering this level of care within the geographic area requested by the patient (or if unable, by the patient's family).  07/12/2012  Patient/family informed of their freedom to choose among providers that offer the needed level of care, that participate in Medicare, Medicaid or managed care program needed by the patient, have an available bed and are willing to accept the patient.    Patient/family informed of [facility]' ownership interest in [facility], as well as of the fact that they are under no obligation to receive care at this facility.  PASARR submitted to EDS on 07/11/2012 PASARR number received from EDS on   FL2 transmitted to all facilities in geographic area requested by pt/family on  07/12/2012 FL2 transmitted to all facilities within larger geographic area on   Patient informed that his/her managed care company has contracts with or will negotiate with  certain facilities, including the following:     Patient/family informed of bed offers received:  07/12/2012 Patient chooses bed at [facility] [place] Physician recommends and patient chooses bed at    Patient to be transferred to [place] on  07/14/2012 Patient to be transferred to facility by [facility]  The following physician request were entered in Epic:   Additional Comments:  Werner Lean LCSW [phone]

## 2012-07-14 NOTE — Progress Notes (Signed)
ANTICOAGULATION CONSULT NOTE - Initial Consult  Pharmacy Consult for Heparin Indication: atrial fibrillation (bridge off warfarin)  Allergies  Allergen Reactions  . Procaine Hcl Anaphylaxis    Patient Measurements: Height: 5' 8.11" (173 cm) Weight: 175 lb (79.379 kg) IBW/kg (Calculated) : 64.15 Heparin Dosing Weight:   Vital Signs: Temp: 98.6 F (37 C) (05/15 0021) Temp src: Oral (05/15 0021) BP: 164/79 mmHg (05/15 0021) Pulse Rate: 78 (05/15 0021)  Labs:  Recent Labs  07/11/12 1416 07/13/12 1935  HGB  --  11.8*  HCT  --  33.9*  PLT  --  255  LABPROT  --  20.1*  INR 2.8 1.78*  CREATININE  --  0.93    Estimated Creatinine Clearance: 62.5 ml/min (by C-G formula based on Cr of 0.93).   Medical History: Past Medical History  Diagnosis Date  . Blindness of left eye   . Eye muscle weakness     Right eye weakness after cataract surgery  . Common migraine     ?hx of  . DIABETES MELLITUS, TYPE II 03/27/2006  . HYPERLIPIDEMIA 03/27/2006  . 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 HYPERTENSION 03/27/2006  . Atrial fibrillation     SVT dx 2007, cath 2007 mild CAD, then had an cardioversion, ablation; still on coumadin , has occ palpitation, EKG 03-2010 NSR  . Headache 05/07/2009  . LUNG NODULE 09/01/2006    Excision, Bx Benighn  . Osteopenia 2004    Dexa 2004 showed Osteopenia, DEXA 03/2007 normal  . Seizures     Medications:  Prescriptions prior to admission  Medication Sig Dispense Refill  . albuterol (VENTOLIN HFA) 108 (90 BASE) MCG/ACT inhaler Inhale 2 puffs into the lungs every 6 (six) hours as needed for wheezing.  1 Inhaler  1  . aspirin EC 81 MG tablet Take 81 mg by mouth daily.      . calcium-vitamin D (OSCAL WITH D) 500-200 MG-UNIT per tablet Take 1 tablet by mouth daily.      [name] diltiazem (CARDIZEM CD) 240 MG 24 hr capsule Take 240 mg by mouth daily.      . insulin NPH-regular (HUMULIN 70/30) (70-30) 100 UNIT/ML injection Inject 35 Units into the skin daily  with breakfast.  10 mL  3  . metoprolol tartrate (LOPRESSOR) 25 MG tablet Take 0.5 tablets (12.5 mg total) by mouth 2 (two) times daily.  60 tablet  12  . Multiple Vitamin (MULTIVITAMIN WITH MINERALS) TABS Take 1 tablet by mouth daily.      . phenytoin (DILANTIN) 100 MG ER capsule Take 100 mg by mouth 3 (three) times daily. Taken with 50mg  tablet per dose to equal 150mg .      . phenytoin (DILANTIN) 50 MG tablet Chew 50 mg by mouth 3 (three) times daily. Taken with 100mg  capsule per dose to equal 150mg .      . pravastatin (PRAVACHOL) 40 MG tablet Take 1 tablet (40 mg total) by mouth daily.  30 tablet  12  . ranitidine (ZANTAC) 300 MG tablet Take 1 tablet (300 mg total) by mouth at bedtime.  30 tablet  3  . warfarin (COUMADIN) 2.5 MG tablet Take 1.25-2.5 mg by mouth daily. 0.5 tab daily except for 1 tab on Fridays.       Infusions:  . sodium chloride    . heparin      Assessment: Patient with chronic warfarin for afib.  INR <2, plan for ortho surgery.  MD wants heparin per pharmacy for bridging.  Goal of Therapy:  Heparin level 0.3-0.7 units/ml Monitor platelets by anticoagulation protocol: Yes   Plan:  No heparin bolus Heparin drip at 1000 units/hr Daily CBC and heparin level Next heparin level at [place], Dola Crowford 07/14/2012,1:03 AM

## 2012-07-14 NOTE — Progress Notes (Addendum)
Clinical Social Work Department CLINICAL SOCIAL WORK PLACEMENT NOTE 07/14/2012  Patient:  [name] [name], [name] [name]  Account Number:  [id number] Admit date:  07/13/2012  Clinical Social Worker:  Werner Lean, LCSW  Date/time:  07/14/2012 04:40 PM  Clinical Social Work is seeking post-discharge placement for this patient at the following level of care:   SKILLED NURSING   (*CSW will update this form in Epic as items are completed)     Patient/family provided with [facility] Department of Clinical Social Work's list of facilities offering this level of care within the geographic area requested by the patient (or if unable, by the patient's family).  07/14/2012  Patient/family informed of their freedom to choose among providers that offer the needed level of care, that participate in Medicare, Medicaid or managed care program needed by the patient, have an available bed and are willing to accept the patient.    Patient/family informed of [facility]' ownership interest in [facility], as well as of the fact that they are under no obligation to receive care at this facility.  PASARR submitted to EDS on  PASARR number received from EDS on 05/29/2011  FL2 transmitted to all facilities in geographic area requested by pt/family on  07/14/2012 FL2 transmitted to all facilities within larger geographic area on   Patient informed that his/her managed care company has contracts with or will negotiate with  certain facilities, including the following:     Patient/family informed of bed offers received:  07/18/12 Patient chooses bed at [facility] Physician recommends and patient chooses bed at    Patient to be transferred to  on  07/20/2012 Patient to be transferred to facility by [facility]  The following physician request were entered in Epic:   Additional Comments:  Werner Lean LCSW [phone]

## 2012-07-14 NOTE — Progress Notes (Signed)
Nutrition Consult Note  Body mass index is 26.52 kg/(m^2). Patient meets criteria for overweight based on current BMI.   Current diet order is CHO modified, patient is consuming approximately 100% of meals at this time. Labs and medications reviewed. Met with pt and family who report pt eating well PTA, follows a strict diabetic diet. Pt reports 30-40 pound intentional weight loss in the past year from eating healthier and getting some exercise. Encouraged high protein diet after surgery to improve muscle strength. Pt currently eating excellent and denied any nutritional concerns.   No nutrition interventions warranted at this time. If nutrition issues arise, please consult RD.   Mikey College MS, [place], [place]ida Pager [phone] After Hours Pager

## 2012-07-14 NOTE — Consult Note (Signed)
HPI: 72 year old female with past medical history of diabetes mellitus, hypertension, hyperlipidemia, atrial flutter status post ablation, paroxysmal atrial fibrillation for preoperative evaluation prior to repair of hip fracture. Patient had a cardiac catheterization in January 2007 which revealed normal left main, 25% proximal LAD, 30% mid. There was no disease in the circumflex or right coronary artery. Ejection fraction was 60%. Echocardiogram in March of 2007 showed normal LV function. Patient was working in her yard in. She lost her balance. There is no syncope. She fractured her hip and will require surgery. We were asked to evaluate prior to her surgery. Note for her fall she could climb 2 flights of stairs with no chest pain and no dyspnea. She denies any dyspnea on exertion, orthopnea, PND, pedal edema, palpitations, chest pain or syncope.  Medications Prior to Admission  Medication Sig Dispense Refill  . albuterol (VENTOLIN HFA) 108 (90 BASE) MCG/ACT inhaler Inhale 2 puffs into the lungs every 6 (six) hours as needed for wheezing.  1 Inhaler  1  . aspirin EC 81 MG tablet Take 81 mg by mouth daily.      . calcium-vitamin D (OSCAL WITH D) 500-200 MG-UNIT per tablet Take 1 tablet by mouth daily.      [name] diltiazem (CARDIZEM CD) 240 MG 24 hr capsule Take 240 mg by mouth daily.      . insulin NPH-regular (HUMULIN 70/30) (70-30) 100 UNIT/ML injection Inject 35 Units into the skin daily with breakfast.  10 mL  3  . metoprolol tartrate (LOPRESSOR) 25 MG tablet Take 0.5 tablets (12.5 mg total) by mouth 2 (two) times daily.  60 tablet  12  . Multiple Vitamin (MULTIVITAMIN WITH MINERALS) TABS Take 1 tablet by mouth daily.      . phenytoin (DILANTIN) 100 MG ER capsule Take 100 mg by mouth 3 (three) times daily. Taken with 50mg  tablet per dose to equal 150mg .      . phenytoin (DILANTIN) 50 MG tablet Chew 50 mg by mouth 3 (three) times daily. Taken with 100mg  capsule per dose to equal 150mg .      .  pravastatin (PRAVACHOL) 40 MG tablet Take 1 tablet (40 mg total) by mouth daily.  30 tablet  12  . ranitidine (ZANTAC) 300 MG tablet Take 1 tablet (300 mg total) by mouth at bedtime.  30 tablet  3  . warfarin (COUMADIN) 2.5 MG tablet Take 1.25-2.5 mg by mouth daily. 0.5 tab daily except for 1 tab on Fridays.        Allergies  Allergen Reactions  . Procaine Hcl Anaphylaxis    Past Medical History  Diagnosis Date  . Blindness of left eye   . Eye muscle weakness     Right eye weakness after cataract surgery  . Common migraine     ?hx of  . DIABETES MELLITUS, TYPE II 03/27/2006  . HYPERLIPIDEMIA 03/27/2006  . 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 HYPERTENSION 03/27/2006  . Atrial fibrillation     SVT dx 2007, cath 2007 mild CAD, then had an cardioversion, ablation; still on coumadin , has occ palpitation, EKG 03-2010 NSR  . Headache 05/07/2009  . LUNG NODULE 09/01/2006    Excision, Bx Benighn  . Osteopenia 2004    Dexa 2004 showed Osteopenia, DEXA 03/2007 normal  . Seizures     Past Surgical History  Procedure Laterality Date  . Shoulder surgery      History   Social History  . Marital Status: Married    Spouse Name:  N/A    Number of Children: 1  . Years of Education: N/A   Occupational History  .     Social History Main Topics  . Smoking status: Former Smoker    Types: Cigarettes    Quit date: 03/02/1978  . Smokeless tobacco: Never Used  . Alcohol Use: No  . Drug Use: No  . Sexually Active: No   Other Topics Concern  . Not on file   Social History Narrative   Moved from [place] 2004   Tobacco--quit in the 80's    Family History  Problem Relation Age of Onset  . Diabetes Father   . Heart attack Father 25  . Diabetes Sister   . Cancer Neg Hx     no hx of colon or breast cancer    ROS:  Pain in right hip but no fevers or chills, productive cough, hemoptysis, dysphasia, odynophagia, melena, hematochezia, dysuria, hematuria, rash, seizure activity, orthopnea,  PND, pedal edema, claudication. Remaining systems are negative.  Physical Exam:   Blood pressure 130/72, pulse 60, temperature 99 F (37.2 C), temperature source Oral, resp. rate 16, height 5' 8.11" (1.73 m), weight 175 lb (79.379 kg), SpO2 94.00%.  General:  Well developed/well nourished in NAD Skin warm/dry Patient not depressed No peripheral clubbing Back-normal HEENT-normal/normal eyelids Neck supple/normal carotid upstroke bilaterally; no bruits; no JVD; no thyromegaly chest - CTA/ normal expansion CV - RRR/normal S1 and S2; no murmurs, rubs or gallops;  PMI nondisplaced Abdomen -NT/ND, no HSM, no mass, + bowel sounds, no bruit 2+ femoral pulses, no bruits, tender to palpation in right hip area Ext-no edema, chords, 2+ DP Neuro-grossly nonfocal  ECG sinus rhythm, first degree AV block, nonspecific ST changes.  Results for orders placed during the hospital encounter of 07/13/12 (from the past 48 hour(s))  CBC WITH DIFFERENTIAL     Status: Abnormal   Collection Time    07/13/12  7:35 PM      Result Value Range   WBC 8.2  4.0 - 10.5 K/uL   RBC 3.79 (*) 3.87 - 5.11 MIL/uL   Hemoglobin 11.8 (*) 12.0 - 15.0 g/dL   HCT 33.9 (*) 36.0 - 46.0 %   MCV 89.4  78.0 - 100.0 fL   MCH 31.1  26.0 - 34.0 pg   MCHC 34.8  30.0 - 36.0 g/dL   RDW 12.3  11.5 - 15.5 %   Platelets 255  150 - 400 K/uL   Neutrophils Relative % 68  43 - 77 %   Neutro Abs 5.6  1.7 - 7.7 K/uL   Lymphocytes Relative 18  12 - 46 %   Lymphs Abs 1.5  0.7 - 4.0 K/uL   Monocytes Relative 13 (*) 3 - 12 %   Monocytes Absolute 1.1 (*) 0.1 - 1.0 K/uL   Eosinophils Relative 1  0 - 5 %   Eosinophils Absolute 0.1  0.0 - 0.7 K/uL   Basophils Relative 0  0 - 1 %   Basophils Absolute 0.0  0.0 - 0.1 K/uL  BASIC METABOLIC PANEL     Status: Abnormal   Collection Time    07/13/12  7:35 PM      Result Value Range   Sodium 137  135 - 145 mEq/L   Potassium 3.4 (*) 3.5 - 5.1 mEq/L   Chloride 97  96 - 112 mEq/L   CO2 27  19 - 32  mEq/L   Glucose, Bld 80  70 - 99 mg/dL  BUN 15  6 - 23 mg/dL   Creatinine, Ser 0.93  0.50 - 1.10 mg/dL   Calcium 9.4  8.4 - 10.5 mg/dL   GFR calc non Af Amer 61 (*) >90 mL/min   GFR calc Af Amer 71 (*) >90 mL/min   Comment:            The eGFR has been calculated     using the CKD EPI equation.     This calculation has not been     validated in all clinical     situations.     eGFR's persistently     <90 mL/min signify     possible Chronic Kidney Disease.  PROTIME-INR     Status: Abnormal   Collection Time    07/13/12  7:35 PM      Result Value Range   Prothrombin Time 20.1 (*) 11.6 - 15.2 seconds   INR 1.78 (*) 0.00 - 1.49  GLUCOSE, CAPILLARY     Status: None   Collection Time    07/13/12  9:32 PM      Result Value Range   Glucose-Capillary 85  70 - 99 mg/dL  ABO/RH     Status: None   Collection Time    07/14/12  1:00 AM      Result Value Range   ABO/RH(D) A NEG    BASIC METABOLIC PANEL     Status: Abnormal   Collection Time    07/14/12  1:30 AM      Result Value Range   Sodium 133 (*) 135 - 145 mEq/L   Potassium 3.8  3.5 - 5.1 mEq/L   Chloride 97  96 - 112 mEq/L   CO2 28  19 - 32 mEq/L   Glucose, Bld 228 (*) 70 - 99 mg/dL   BUN 15  6 - 23 mg/dL   Creatinine, Ser 0.88  0.50 - 1.10 mg/dL   Calcium 8.3 (*) 8.4 - 10.5 mg/dL   GFR calc non Af Amer 65 (*) >90 mL/min   GFR calc Af Amer 75 (*) >90 mL/min   Comment:            The eGFR has been calculated     using the CKD EPI equation.     This calculation has not been     validated in all clinical     situations.     eGFR's persistently     <90 mL/min signify     possible Chronic Kidney Disease.  PROTIME-INR     Status: Abnormal   Collection Time    07/14/12  1:30 AM      Result Value Range   Prothrombin Time 21.4 (*) 11.6 - 15.2 seconds   INR 1.94 (*) 0.00 - 1.49  TYPE AND SCREEN     Status: None   Collection Time    07/14/12  1:30 AM      Result Value Range   ABO/RH(D) A NEG     Antibody Screen NEG      Sample Expiration 07/17/2012    PHENYTOIN LEVEL, TOTAL     Status: Abnormal   Collection Time    07/14/12  1:30 AM      Result Value Range   Phenytoin Lvl 23.6 (*) 10.0 - 20.0 ug/mL  CBC     Status: Abnormal   Collection Time    07/14/12  1:30 AM      Result Value Range   WBC 10.0  4.0 - 10.5 K/uL  RBC 3.42 (*) 3.87 - 5.11 MIL/uL   Hemoglobin 10.3 (*) 12.0 - 15.0 g/dL   HCT 30.6 (*) 36.0 - 46.0 %   MCV 89.5  78.0 - 100.0 fL   MCH 30.1  26.0 - 34.0 pg   MCHC 33.7  30.0 - 36.0 g/dL   RDW 12.5  11.5 - 15.5 %   Platelets 219  150 - 400 K/uL  GLUCOSE, CAPILLARY     Status: Abnormal   Collection Time    07/14/12  1:37 AM      Result Value Range   Glucose-Capillary 197 (*) 70 - 99 mg/dL  GLUCOSE, CAPILLARY     Status: Abnormal   Collection Time    07/14/12  6:06 AM      Result Value Range   Glucose-Capillary 166 (*) 70 - 99 mg/dL    Dg Chest 2 View  07/13/2012   *RADIOLOGY REPORT*  Clinical Data: Fall, right rib pain.  CHEST - 2 VIEW  Comparison: 03/03/2012  Findings: Old healed right sixth rib fracture.  No acute rib fracture.  Heart is enlarged.  No confluent airspace opacities in the lungs.  Mild interstitial prominence.  The lateral view is suboptimal as the patient's arm is down by her side.  No visible effusions.  IMPRESSION: No acute findings.  No visible acute rib fracture.  Old healed right sixth rib fracture.   Original Report Authenticated By: Rolm Baptise, M.D.   Dg Shoulder Right  07/13/2012   *RADIOLOGY REPORT*  Clinical Data: Fall, right shoulder pain.  RIGHT SHOULDER - 2+ VIEW  Comparison: 08/12/2011  Findings: Degenerative changes in the right [facility] and glenohumeral joint.  Loss of some acromial space compatible with rotator cuff disease.  No fracture, subluxation or dislocation.  Old healed right sixth rib fracture.  IMPRESSION: No acute bony abnormality.  Degenerative changes.   Original Report Authenticated By: Rolm Baptise, M.D.   Dg Hip Complete Right  07/13/2012    *RADIOLOGY REPORT*  Clinical Data: Fall and hip pain.  RIGHT HIP - COMPLETE 2+ VIEW  Comparison: None.  Findings: There is a mildly displaced fracture involving the proximal right femur.  This appears to represent an intertrochanteric fracture.  The right hip is located. Pelvic bony ring is intact.  IMPRESSION: Mildly displaced right femoral intertrochanteric fracture.   Original Report Authenticated By: Markus Daft, M.D.   Ct Head Wo Contrast  07/13/2012   *RADIOLOGY REPORT*  Clinical Data:  Fall.  Injury to head and neck.  Fracture tip.  CT HEAD WITHOUT CONTRAST CT CERVICAL SPINE WITHOUT CONTRAST  Technique:  Multidetector CT imaging of the head and cervical spine was performed following the standard protocol without intravenous contrast.  Multiplanar CT image reconstructions of the cervical spine were also generated.  Comparison:  11/05/2011  CT HEAD  Findings: Old area of encephalomalacia in the left temporal lobe. No acute infarction.  No hemorrhage or hydrocephalus.  No mass effect midline shift.  No extra-axial fluid collection.  No acute calvarial abnormality. Visualized paranasal sinuses and mastoids clear.  Orbital soft tissues unremarkable.  IMPRESSION: Stable left temporal encephalomalacia. No acute intracranial abnormality.  CT CERVICAL SPINE  Findings: Degenerative disc and facet disease throughout the cervical spine, most pronounced in the mid and lower cervical spine.  Fusion across the C6-7 disc space.  Prevertebral soft tissues are normal.  No fracture.  No epidural or paraspinal hematoma.  IMPRESSION: Degenerative changes.  Fusion at C6-7.  No acute findings.   Original Report  Authenticated By: Rolm Baptise, M.D.   Ct Cervical Spine Wo Contrast  07/13/2012   *RADIOLOGY REPORT*  Clinical Data:  Fall.  Injury to head and neck.  Fracture tip.  CT HEAD WITHOUT CONTRAST CT CERVICAL SPINE WITHOUT CONTRAST  Technique:  Multidetector CT imaging of the head and cervical spine was performed following the  standard protocol without intravenous contrast.  Multiplanar CT image reconstructions of the cervical spine were also generated.  Comparison:  11/05/2011  CT HEAD  Findings: Old area of encephalomalacia in the left temporal lobe. No acute infarction.  No hemorrhage or hydrocephalus.  No mass effect midline shift.  No extra-axial fluid collection.  No acute calvarial abnormality. Visualized paranasal sinuses and mastoids clear.  Orbital soft tissues unremarkable.  IMPRESSION: Stable left temporal encephalomalacia. No acute intracranial abnormality.  CT CERVICAL SPINE  Findings: Degenerative disc and facet disease throughout the cervical spine, most pronounced in the mid and lower cervical spine.  Fusion across the C6-7 disc space.  Prevertebral soft tissues are normal.  No fracture.  No epidural or paraspinal hematoma.  IMPRESSION: Degenerative changes.  Fusion at C6-7.  No acute findings.   Original Report Authenticated By: Rolm Baptise, M.D.    Assessment/Plan 1 preop-the patient will be at mildly increased risk given history of diabetes mellitus and hypertension. However she had a cardiac catheterization in 2007 showing no obstructive coronary artery disease. She has also had no chest pain or dyspnea and is able to climb 2 flights of stairs with no symptoms. I think she can proceed with surgery without further cardiac evaluation. 2 history of paroxysmal atrial fibrillation-continue calcium blocker and beta blocker. Resume Coumadin postoperatively when okay with orthopedic surgery. 3 hypertension-continue preadmission medications. 4 diabetes mellitus-management per primary care.  Kirk Ruths MD 07/14/2012, 9:31 AM

## 2012-07-14 NOTE — Progress Notes (Signed)
CSW consulted for SNF placement. Pt requested CSW return at a later time. CSW will meet with pt when she is more alert.  Werner Lean LCSW [phone]

## 2012-07-14 NOTE — Progress Notes (Signed)
PHARMACY BRIEF NOTE - DRUG LEVEL RESULT  Consult:  IV Heparin Indication:  On chronic Warfarin due to atrial fibrillation; bridging for surgery on 07/15/12  With the Heparin infusion running at 1000 units/hr, the Heparin level drawn at 16:34 today was reported as 0.51 units/ml.  This level is within the therapeutic range, 0.3-0.7 units/ml.  The patient's nurse has not noticed any bleeding or problems with the infusion.  Plan:  Continue the infusion at the current rate.  Discontinue the infusion at midnight as ordered.  UnderwoodPh. 07/14/2012 5:57 PM

## 2012-07-14 NOTE — Care Management Note (Addendum)
    Page 1 of 1   07/20/2012     2:16:14 PM   CARE MANAGEMENT NOTE 07/20/2012  Patient:  [name] [name], [name] [name]   Account Number:  [id number]  Date Initiated:  07/14/2012  Documentation initiated by:  Sherrin Daisy  Subjective/Objective Assessment:   dx rt intertrochanteric femur fx requiring surgery     Action/Plan:   CM will f/u  up post surg for needs   Anticipated DC Date:  07/20/2012   Anticipated DC Plan:  SKILLED NURSING FACILITY  In-house referral  Clinical Social Worker      DC Planning Services  CM consult      Choice offered to / List presented to:             Status of service:  Completed, signed off Medicare Important Message given?   (If response is "NO", the following Medicare IM given date fields will be blank) Date Medicare IM given:   Date Additional Medicare IM given:    Discharge Disposition:  ACUTE TO ACUTE TRANS  Per UR Regulation:  Reviewed for med. necessity/level of care/duration of stay  If discussed at [place] of Stay Meetings, dates discussed:    Comments:  07/20/12 14:15 Tomi Bamberger RN, BSN [phone] patient dc to [facility], CSW following.  07/15/2012 Sherrin Daisy, BSN RN CCM [phone] Per documentation pt was transferred to [facility] 05/15 and will have surgery 07/15/2012.

## 2012-07-14 NOTE — Progress Notes (Signed)
Clinical Social Work Department BRIEF PSYCHOSOCIAL ASSESSMENT 07/14/2012  Patient:  [name] [name], [name] [name]     Account Number:  [id number]     Admit date:  07/13/2012  Clinical Social Worker:  Lacie Scotts  Date/Time:  07/14/2012 04:09 PM  Referred by:  Physician  Date Referred:  07/14/2012 Referred for  SNF Placement   Other Referral:   Interview type:  Patient Other interview type:    PSYCHOSOCIAL DATA Living Status:  HUSBAND Admitted from facility:   Level of care:   Primary support name:  Glorious Peach Primary support relationship to patient:  SPOUSE Degree of support available:   supportive    CURRENT CONCERNS Current Concerns  Post-Acute Placement   Other Concerns:    SOCIAL WORK ASSESSMENT / PLAN Pt is a 72 yr old female living at home prior to hospitalization. CSW met with pt / family to assist with d/c planning. Pt states she will be trans to [facility] for hip surgery tomorrow. Pt / spouse feel ST Rehab will be needed following hospital d/c. Pt has been to [facility] in the past and would like to return for rehab. SNF has been contacted and will review clinicals. CSW has initiated a SNF search and will provide bed offers when available.   Assessment/plan status:  Psychosocial Support/Ongoing Assessment of Needs Other assessment/ plan:   Information/referral to community resources:   SNF list with bed offers to be provided.    PATIENT'S/FAMILY'S RESPONSE TO PLAN OF CARE: Pt is looking forward to having her surgery completed. She would like to have rehab at [facility].   Werner Lean LCSW [phone]

## 2012-07-14 NOTE — Progress Notes (Signed)
ANTICOAGULATION CONSULT NOTE - Follow Up Consult  Pharmacy Consult for IV Heparin Indication: atrial fibrillation (bridge off warfarin)  Patient Measurements: Height: 5' 8.11" (173 cm) Weight: 175 lb (79.379 kg) IBW/kg (Calculated) : 64.15 Heparin Dosing Weight: 79 kg  Labs:  Recent Labs  07/13/12 1935 07/14/12 0130 07/14/12 0950  HGB 11.8* 10.3*  --   HCT 33.9* 30.6*  --   PLT 255 219  --   LABPROT 20.1* 21.4* 20.8*  INR 1.78* 1.94* 1.87*  HEPARINUNFRC  --   --  0.37  CREATININE 0.93 0.88  --     Estimated Creatinine Clearance: 66 ml/min (by C-G formula based on Cr of 0.88).    Assessment: 20 yoF on chronic warfarin PTA for atrial fibrillation.  Warfarin on hold in preparation for ortho surgery.  IV heparin initiated early this AM for bridging to procedure.  Heparin infusion currently infusing at 1000 units/hr.  First heparin level therapeutic.  Hgb low, platelets ok.  No bleeding/complications reported.    Vitamin K 2.5 mg po given early this AM  Goal of Therapy:  Heparin level 0.3-0.7 units/ml Monitor platelets by anticoagulation protocol: Yes   Plan:  Continue heparin at 1000 units/hr.   Recheck heparin level at 1600 to confirm rate.  Then check heparin level and CBC daily.  Hershal Coria 07/14/2012,11:00 AM

## 2012-07-14 NOTE — Progress Notes (Signed)
[facility] PROGRESS NOTE  [name] [name] [id number] DOB: May 20, 1940 DOA: 07/13/2012 PCP: [name], MD  Assessment/Plan: 1. Fall with femoral intertrochanteric fracture- right- hip fracture pathway- NPO after midnight, plan for surgery in AM- trend INR 2. A fib- rate controlled- heparin gtt for bridging, EKG pending (very active at home- no SOB/CP with activity) 3. seizure d/o- her phenytoin level is slightly high, will hold dilantin for 1 day and restart in am.  4. DM- hold 70/30- lantus and SSI  CBG (last 3)   Recent Labs  07/14/12 0606 07/14/12 1000 07/14/12 1407  GLUCAP 166* 92 253*     5. Fall- mechanical fall. PT eval after surgery.  6. Hypokalemia- replace as needed.   Code Status: presumed FULL CODE Family Communication: none at bedside Disposition Plan: pending PT eval   Consultants:  Ortho   cardiology  Procedures:  ORIF scheduled later today.  Antibiotics:    HPI/Subjective: Feel sleepy, pain is tolerable. No nausea or vomiting.   Objective: Filed Vitals:   07/14/12 0058 07/14/12 0124 07/14/12 0415 07/14/12 0608  BP:  151/68  130/72  Pulse:  76  60  Temp:  98.9 F (37.2 C)  99 F (37.2 C)  TempSrc:  Oral  Oral  Resp:  16  16  Height: 5' 8.11" (1.73 m)     Weight:      SpO2:  98% 99% 94%    Intake/Output Summary (Last 24 hours) at 07/14/12 0912 Last data filed at 07/14/12 [id number]  Gross per 24 hour  Intake 232.83 ml  Output   1900 ml  Net -1667.17 ml   Filed Weights   07/13/12 1842  Weight: 79.379 kg (175 lb)    Exam:   General:  Drowsy, comfortable, able to answer all questions.   Cardiovascular: s1s2 RRR  Respiratory: CTAB no wheezing or rhonchi  Abdomen: soft NT ND BS+  Musculoskeletal: right leg externally rotated. Painful ROM int he right leg.   Data Reviewed: Basic Metabolic Panel:  Recent Labs Lab 07/13/12 1935 07/14/12 0130  NA 137 133*  K 3.4* 3.8  CL 97 97  CO2 27 28  GLUCOSE 80 228*  BUN  15 15  CREATININE 0.93 0.88  CALCIUM 9.4 8.3*   Liver Function Tests: No results found for this basename: AST, ALT, ALKPHOS, BILITOT, PROT, ALBUMIN,  in the last 168 hours No results found for this basename: LIPASE, AMYLASE,  in the last 168 hours No results found for this basename: AMMONIA,  in the last 168 hours CBC:  Recent Labs Lab 07/13/12 1935 07/14/12 0130  WBC 8.2 10.0  NEUTROABS 5.6  --   HGB 11.8* 10.3*  HCT 33.9* 30.6*  MCV 89.4 89.5  PLT 255 219   Cardiac Enzymes: No results found for this basename: CKTOTAL, CKMB, CKMBINDEX, TROPONINI,  in the last 168 hours BNP (last 3 results) No results found for this basename: PROBNP,  in the last 8760 hours CBG:  Recent Labs Lab 07/13/12 2132 07/14/12 0137 07/14/12 0606  GLUCAP 85 197* 166*    No results found for this or any previous visit (from the past 240 hour(s)).   Studies: Dg Chest 2 View  07/13/2012   *RADIOLOGY REPORT*  Clinical Data: Fall, right rib pain.  CHEST - 2 VIEW  Comparison: 03/03/2012  Findings: Old healed right sixth rib fracture.  No acute rib fracture.  Heart is enlarged.  No confluent airspace opacities in the lungs.  Mild interstitial prominence.  The lateral  view is suboptimal as the patient's arm is down by her side.  No visible effusions.  IMPRESSION: No acute findings.  No visible acute rib fracture.  Old healed right sixth rib fracture.   Original Report Authenticated By: Rolm Baptise, M.D.   Dg Shoulder Right  07/13/2012   *RADIOLOGY REPORT*  Clinical Data: Fall, right shoulder pain.  RIGHT SHOULDER - 2+ VIEW  Comparison: 08/12/2011  Findings: Degenerative changes in the right [facility] and glenohumeral joint.  Loss of some acromial space compatible with rotator cuff disease.  No fracture, subluxation or dislocation.  Old healed right sixth rib fracture.  IMPRESSION: No acute bony abnormality.  Degenerative changes.   Original Report Authenticated By: Rolm Baptise, M.D.   Dg Hip Complete  Right  07/13/2012   *RADIOLOGY REPORT*  Clinical Data: Fall and hip pain.  RIGHT HIP - COMPLETE 2+ VIEW  Comparison: None.  Findings: There is a mildly displaced fracture involving the proximal right femur.  This appears to represent an intertrochanteric fracture.  The right hip is located. Pelvic bony ring is intact.  IMPRESSION: Mildly displaced right femoral intertrochanteric fracture.   Original Report Authenticated By: Markus Daft, M.D.   Ct Head Wo Contrast  07/13/2012   *RADIOLOGY REPORT*  Clinical Data:  Fall.  Injury to head and neck.  Fracture tip.  CT HEAD WITHOUT CONTRAST CT CERVICAL SPINE WITHOUT CONTRAST  Technique:  Multidetector CT imaging of the head and cervical spine was performed following the standard protocol without intravenous contrast.  Multiplanar CT image reconstructions of the cervical spine were also generated.  Comparison:  11/05/2011  CT HEAD  Findings: Old area of encephalomalacia in the left temporal lobe. No acute infarction.  No hemorrhage or hydrocephalus.  No mass effect midline shift.  No extra-axial fluid collection.  No acute calvarial abnormality. Visualized paranasal sinuses and mastoids clear.  Orbital soft tissues unremarkable.  IMPRESSION: Stable left temporal encephalomalacia. No acute intracranial abnormality.  CT CERVICAL SPINE  Findings: Degenerative disc and facet disease throughout the cervical spine, most pronounced in the mid and lower cervical spine.  Fusion across the C6-7 disc space.  Prevertebral soft tissues are normal.  No fracture.  No epidural or paraspinal hematoma.  IMPRESSION: Degenerative changes.  Fusion at C6-7.  No acute findings.   Original Report Authenticated By: Rolm Baptise, M.D.   Ct Cervical Spine Wo Contrast  07/13/2012   *RADIOLOGY REPORT*  Clinical Data:  Fall.  Injury to head and neck.  Fracture tip.  CT HEAD WITHOUT CONTRAST CT CERVICAL SPINE WITHOUT CONTRAST  Technique:  Multidetector CT imaging of the head and cervical spine was  performed following the standard protocol without intravenous contrast.  Multiplanar CT image reconstructions of the cervical spine were also generated.  Comparison:  11/05/2011  CT HEAD  Findings: Old area of encephalomalacia in the left temporal lobe. No acute infarction.  No hemorrhage or hydrocephalus.  No mass effect midline shift.  No extra-axial fluid collection.  No acute calvarial abnormality. Visualized paranasal sinuses and mastoids clear.  Orbital soft tissues unremarkable.  IMPRESSION: Stable left temporal encephalomalacia. No acute intracranial abnormality.  CT CERVICAL SPINE  Findings: Degenerative disc and facet disease throughout the cervical spine, most pronounced in the mid and lower cervical spine.  Fusion across the C6-7 disc space.  Prevertebral soft tissues are normal.  No fracture.  No epidural or paraspinal hematoma.  IMPRESSION: Degenerative changes.  Fusion at C6-7.  No acute findings.   Original Report Authenticated By: Lennette Bihari  D[place], M.D.    Scheduled Meds: . aspirin EC  81 mg Oral Daily  . calcium-vitamin D  1 tablet Oral Daily  . diltiazem  240 mg Oral Daily  . famotidine  20 mg Oral Daily  . insulin aspart  0-20 Units Subcutaneous Q4H  . insulin aspart  0-5 Units Subcutaneous QHS  . insulin glargine  15 Units Subcutaneous QHS  . metoprolol tartrate  12.5 mg Oral BID  . multivitamin with minerals  1 tablet Oral Daily  . pravastatin  40 mg Oral q1800   Continuous Infusions: . sodium chloride 50 mL/hr at 07/14/12 0105  . heparin 1,000 Units/hr (07/14/12 0118)    Principal Problem:   Hip fracture requiring operative repair Active Problems:   Atrial fibrillation   Seizure disorder   DM (diabetes mellitus)   Fall   Diabetes   Hypokalemia    Time spent: 28 minutes.     [facility]  [facility] Pager [phone]. If 7PM-7AM, please contact night-coverage at [email or web address], password [facility] 07/14/2012, 9:12 AM  LOS: 1 day

## 2012-07-15 ENCOUNTER — Encounter (HOSPITAL_COMMUNITY)
Admission: EM | Disposition: A | Discharge status: Skilled Nursing Facility | Payer: Self-pay | Source: Home / Self Care | Attending: Internal Medicine

## 2012-07-15 ENCOUNTER — Inpatient Hospital Stay (HOSPITAL_COMMUNITY): Payer: Medicare Other

## 2012-07-15 ENCOUNTER — Encounter (HOSPITAL_COMMUNITY): Payer: Self-pay | Admitting: Anesthesiology

## 2012-07-15 ENCOUNTER — Inpatient Hospital Stay (HOSPITAL_COMMUNITY): Payer: Medicare Other | Admitting: Anesthesiology

## 2012-07-15 ENCOUNTER — Telehealth: Payer: Self-pay | Admitting: Endocrinology

## 2012-07-15 HISTORY — PX: FEMUR IM NAIL: SHX1597

## 2012-07-15 LAB — GLUCOSE, CAPILLARY
Glucose-Capillary: 125 mg/dL — ABNORMAL HIGH (ref 70–99)
Glucose-Capillary: 131 mg/dL — ABNORMAL HIGH (ref 70–99)
Glucose-Capillary: 197 mg/dL — ABNORMAL HIGH (ref 70–99)

## 2012-07-15 LAB — CBC
HCT: 29.2 % — ABNORMAL LOW (ref 36.0–46.0)
MCH: 31.2 pg (ref 26.0–34.0)
MCHC: 34.6 g/dL (ref 30.0–36.0)
MCV: 90.1 fL (ref 78.0–100.0)
RDW: 12.4 % (ref 11.5–15.5)

## 2012-07-15 SURGERY — INSERTION, INTRAMEDULLARY ROD, FEMUR
Anesthesia: General | Site: Hip | Laterality: Right | Wound class: Clean

## 2012-07-15 MED ORDER — FLEET ENEMA 7-19 GM/118ML RE ENEM: 1.0000 | ENEMA | Freq: Once | RECTAL | Status: AC | PRN

## 2012-07-15 MED ORDER — NEOSTIGMINE METHYLSULFATE 1 MG/ML IJ SOLN
INTRAMUSCULAR | Status: DC | PRN
  Administered 2012-07-15: 4 mg via INTRAVENOUS

## 2012-07-15 MED ORDER — LACTATED RINGERS IV SOLN
INTRAVENOUS | Status: DC | PRN
  Administered 2012-07-15 (×2): via INTRAVENOUS

## 2012-07-15 MED ORDER — 0.9 % SODIUM CHLORIDE (POUR BTL) OPTIME
TOPICAL | Status: DC | PRN
  Administered 2012-07-15: 1000 mL

## 2012-07-15 MED ORDER — HEPARIN SODIUM (PORCINE) 5000 UNIT/ML IJ SOLN
5000.0000 [IU] | Freq: Three times a day (TID) | INTRAMUSCULAR | Status: DC
  Administered 2012-07-15 – 2012-07-20 (×12): 5000 [IU] via SUBCUTANEOUS
  Filled 2012-07-15 (×17): qty 1

## 2012-07-15 MED ORDER — METOCLOPRAMIDE HCL 5 MG/ML IJ SOLN: 10.0000 mg | Freq: Once | INTRAMUSCULAR | Status: DC | PRN

## 2012-07-15 MED ORDER — ONDANSETRON HCL 4 MG PO TABS: 4.0000 mg | ORAL_TABLET | Freq: Four times a day (QID) | ORAL | Status: DC | PRN

## 2012-07-15 MED ORDER — FENTANYL CITRATE 0.05 MG/ML IJ SOLN
INTRAMUSCULAR | Status: AC
  Filled 2012-07-15: qty 2

## 2012-07-15 MED ORDER — ZOLPIDEM TARTRATE 5 MG PO TABS
5.0000 mg | ORAL_TABLET | Freq: Every evening | ORAL | Status: DC | PRN
  Administered 2012-07-17 – 2012-07-19 (×2): 5 mg via ORAL
  Filled 2012-07-15 (×2): qty 1

## 2012-07-15 MED ORDER — CEFAZOLIN SODIUM-DEXTROSE 2-3 GM-% IV SOLR
INTRAVENOUS | Status: DC | PRN
  Administered 2012-07-15: 2 g via INTRAVENOUS

## 2012-07-15 MED ORDER — METOCLOPRAMIDE HCL 10 MG PO TABS
5.0000 mg | ORAL_TABLET | Freq: Three times a day (TID) | ORAL | Status: DC | PRN
  Administered 2012-07-17: 10 mg via ORAL
  Filled 2012-07-15: qty 2

## 2012-07-15 MED ORDER — WARFARIN SODIUM 5 MG PO TABS: 5.0000 mg | ORAL_TABLET | Freq: Every day | ORAL | Status: DC

## 2012-07-15 MED ORDER — ALBUMIN HUMAN 5 % IV SOLN
INTRAVENOUS | Status: DC | PRN
  Administered 2012-07-15: 12:00:00 via INTRAVENOUS

## 2012-07-15 MED ORDER — ONDANSETRON HCL 4 MG/2ML IJ SOLN
INTRAMUSCULAR | Status: DC | PRN
  Administered 2012-07-15: 4 mg via INTRAVENOUS

## 2012-07-15 MED ORDER — LACTATED RINGERS IV SOLN
INTRAVENOUS | Status: DC
  Administered 2012-07-15: 09:00:00 via INTRAVENOUS

## 2012-07-15 MED ORDER — POTASSIUM CHLORIDE IN NACL 20-0.45 MEQ/L-% IV SOLN
INTRAVENOUS | Status: DC
  Administered 2012-07-15 – 2012-07-16 (×2): via INTRAVENOUS
  Administered 2012-07-16: 75 mL/h via INTRAVENOUS
  Filled 2012-07-15 (×10): qty 1000

## 2012-07-15 MED ORDER — METOCLOPRAMIDE HCL 5 MG/ML IJ SOLN
5.0000 mg | Freq: Three times a day (TID) | INTRAMUSCULAR | Status: DC | PRN
  Administered 2012-07-15 – 2012-07-16 (×2): 10 mg via INTRAVENOUS
  Filled 2012-07-15 (×2): qty 2

## 2012-07-15 MED ORDER — LIDOCAINE HCL (CARDIAC) 20 MG/ML IV SOLN
INTRAVENOUS | Status: DC | PRN
  Administered 2012-07-15: 100 mg via INTRAVENOUS
  Administered 2012-07-15: 50 mg via INTRAVENOUS

## 2012-07-15 MED ORDER — DOCUSATE SODIUM 100 MG PO CAPS
100.0000 mg | ORAL_CAPSULE | Freq: Two times a day (BID) | ORAL | Status: DC
  Administered 2012-07-15 – 2012-07-20 (×10): 100 mg via ORAL
  Filled 2012-07-15 (×11): qty 1

## 2012-07-15 MED ORDER — WARFARIN SODIUM 2.5 MG PO TABS
2.5000 mg | ORAL_TABLET | Freq: Once | ORAL | Status: AC
  Administered 2012-07-15: 2.5 mg via ORAL
  Filled 2012-07-15 (×2): qty 1

## 2012-07-15 MED ORDER — HYDROCODONE-ACETAMINOPHEN 5-325 MG PO TABS: 1.0000 | ORAL_TABLET | Freq: Four times a day (QID) | ORAL | Status: DC | PRN

## 2012-07-15 MED ORDER — ACETAMINOPHEN 650 MG RE SUPP: 650.0000 mg | Freq: Four times a day (QID) | RECTAL | Status: DC | PRN

## 2012-07-15 MED ORDER — FENTANYL CITRATE 0.05 MG/ML IJ SOLN
25.0000 ug | INTRAMUSCULAR | Status: DC | PRN
  Administered 2012-07-15 (×3): 50 ug via INTRAVENOUS

## 2012-07-15 MED ORDER — PHENOL 1.4 % MT LIQD: 1.0000 | OROMUCOSAL | Status: DC | PRN

## 2012-07-15 MED ORDER — POLYETHYLENE GLYCOL 3350 17 G PO PACK
17.0000 g | PACK | Freq: Every day | ORAL | Status: DC | PRN
  Administered 2012-07-16 – 2012-07-17 (×2): 17 g via ORAL
  Filled 2012-07-15 (×2): qty 1

## 2012-07-15 MED ORDER — SENNA 8.6 MG PO TABS
1.0000 | ORAL_TABLET | Freq: Two times a day (BID) | ORAL | Status: DC
  Administered 2012-07-15 – 2012-07-20 (×10): 8.6 mg via ORAL
  Filled 2012-07-15 (×11): qty 1

## 2012-07-15 MED ORDER — WARFARIN - PHARMACIST DOSING INPATIENT: Freq: Every day | Status: DC

## 2012-07-15 MED ORDER — CEFAZOLIN SODIUM-DEXTROSE 2-3 GM-% IV SOLR
2.0000 g | Freq: Four times a day (QID) | INTRAVENOUS | Status: AC
  Administered 2012-07-15 – 2012-07-16 (×2): 2 g via INTRAVENOUS
  Filled 2012-07-15 (×2): qty 50

## 2012-07-15 MED ORDER — ACETAMINOPHEN 325 MG PO TABS: 650.0000 mg | ORAL_TABLET | Freq: Four times a day (QID) | ORAL | Status: DC | PRN

## 2012-07-15 MED ORDER — HYDROCODONE-ACETAMINOPHEN 5-325 MG PO TABS
1.0000 | ORAL_TABLET | Freq: Four times a day (QID) | ORAL | Status: DC | PRN
  Administered 2012-07-16 – 2012-07-17 (×4): 1 via ORAL
  Administered 2012-07-18: 2 via ORAL
  Filled 2012-07-15 (×3): qty 1
  Filled 2012-07-15: qty 2
  Filled 2012-07-15: qty 1

## 2012-07-15 MED ORDER — PHENYLEPHRINE HCL 10 MG/ML IJ SOLN
INTRAMUSCULAR | Status: DC | PRN
  Administered 2012-07-15: 40 ug via INTRAVENOUS
  Administered 2012-07-15 (×6): 80 ug via INTRAVENOUS
  Administered 2012-07-15: 120 ug via INTRAVENOUS
  Administered 2012-07-15 (×2): 80 ug via INTRAVENOUS

## 2012-07-15 MED ORDER — MORPHINE SULFATE 2 MG/ML IJ SOLN: 0.5000 mg | INTRAMUSCULAR | Status: DC | PRN

## 2012-07-15 MED ORDER — ARTIFICIAL TEARS OP OINT
TOPICAL_OINTMENT | OPHTHALMIC | Status: DC | PRN
  Administered 2012-07-15: 1 via OPHTHALMIC

## 2012-07-15 MED ORDER — MENTHOL 3 MG MT LOZG: 1.0000 | LOZENGE | OROMUCOSAL | Status: DC | PRN

## 2012-07-15 MED ORDER — ROCURONIUM BROMIDE 100 MG/10ML IV SOLN
INTRAVENOUS | Status: DC | PRN
  Administered 2012-07-15: 50 mg via INTRAVENOUS
  Administered 2012-07-15 (×2): 10 mg via INTRAVENOUS

## 2012-07-15 MED ORDER — GLYCOPYRROLATE 0.2 MG/ML IJ SOLN
INTRAMUSCULAR | Status: DC | PRN
  Administered 2012-07-15: 0.6 mg via INTRAVENOUS
  Administered 2012-07-15: .15 mg via INTRAVENOUS

## 2012-07-15 MED ORDER — PROPOFOL 10 MG/ML IV BOLUS
INTRAVENOUS | Status: DC | PRN
  Administered 2012-07-15: 130 mg via INTRAVENOUS

## 2012-07-15 MED ORDER — FENTANYL CITRATE 0.05 MG/ML IJ SOLN
INTRAMUSCULAR | Status: DC | PRN
  Administered 2012-07-15 (×2): 50 ug via INTRAVENOUS

## 2012-07-15 MED ORDER — ONDANSETRON HCL 4 MG/2ML IJ SOLN
4.0000 mg | Freq: Four times a day (QID) | INTRAMUSCULAR | Status: DC | PRN
  Administered 2012-07-16 (×2): 4 mg via INTRAVENOUS
  Filled 2012-07-15 (×2): qty 2

## 2012-07-15 MED ORDER — SENNA-DOCUSATE SODIUM 8.6-50 MG PO TABS: 2.0000 | ORAL_TABLET | Freq: Every day | ORAL | Status: DC

## 2012-07-15 MED ORDER — BISACODYL 10 MG RE SUPP: 10.0000 mg | Freq: Every day | RECTAL | Status: DC | PRN

## 2012-07-15 MED ORDER — ACETAMINOPHEN 10 MG/ML IV SOLN
1000.0000 mg | Freq: Once | INTRAVENOUS | Status: DC | PRN
  Administered 2012-07-15: 1000 mg via INTRAVENOUS

## 2012-07-15 SURGICAL SUPPLY — 44 items
APL SKNCLS STERI-STRIP NONHPOA (GAUZE/BANDAGES/DRESSINGS) ×1
BENZOIN TINCTURE PRP APPL 2/3 (GAUZE/BANDAGES/DRESSINGS) ×2 IMPLANT
BIT DRILL 4.0X195MM (BIT) IMPLANT
BLADE TI HELICAL 11.0 95 (Orthopedic Implant) ×1 IMPLANT
BOOTCOVER CLEANROOM LRG (PROTECTIVE WEAR) ×4 IMPLANT
CLOTH BEACON ORANGE TIMEOUT ST (SAFETY) ×2 IMPLANT
CLSR STERI-STRIP ANTIMIC 1/2X4 (GAUZE/BANDAGES/DRESSINGS) ×1 IMPLANT
COVER SURGICAL LIGHT HANDLE (MISCELLANEOUS) ×2 IMPLANT
DRAPE STERI IOBAN 125X83 (DRAPES) ×2 IMPLANT
DRILL BIT 4.0X195MM (BIT) ×1
DRSG MEPILEX BORDER 4X4 (GAUZE/BANDAGES/DRESSINGS) ×2 IMPLANT
DRSG MEPILEX BORDER 4X8 (GAUZE/BANDAGES/DRESSINGS) ×4 IMPLANT
DURAPREP 26ML APPLICATOR (WOUND CARE) ×2 IMPLANT
ELECT CAUTERY BLADE 6.4 (BLADE) ×2 IMPLANT
ELECT REM PT RETURN 9FT ADLT (ELECTROSURGICAL) ×2
ELECTRODE REM PT RTRN 9FT ADLT (ELECTROSURGICAL) ×1 IMPLANT
EVACUATOR 1/8 PVC DRAIN (DRAIN) IMPLANT
FACESHIELD LNG OPTICON STERILE (SAFETY) ×4 IMPLANT
GAUZE XEROFORM 5X9 LF (GAUZE/BANDAGES/DRESSINGS) ×2 IMPLANT
GLOVE BIOGEL PI ORTHO PRO SZ8 (GLOVE) ×1
GLOVE ORTHO TXT STRL SZ7.5 (GLOVE) ×4 IMPLANT
GLOVE PI ORTHO PRO STRL SZ8 (GLOVE) ×1 IMPLANT
GLOVE SURG ORTHO 8.0 STRL STRW (GLOVE) ×4 IMPLANT
GOWN STRL NON-REIN LRG LVL3 (GOWN DISPOSABLE) IMPLANT
GOWN STRL REIN XL XLG (GOWN DISPOSABLE) ×1 IMPLANT
GUIDEWIRE 3.2X400 (WIRE) ×1 IMPLANT
KIT ROOM TURNOVER OR (KITS) ×2 IMPLANT
MANIFOLD NEPTUNE II (INSTRUMENTS) ×2 IMPLANT
NAIL TROCH 11X130X400MM (Nail) ×1 IMPLANT
NS IRRIG 1000ML POUR BTL (IV SOLUTION) ×2 IMPLANT
PACK GENERAL/GYN (CUSTOM PROCEDURE TRAY) ×2 IMPLANT
PAD ARMBOARD 7.5X6 YLW CONV (MISCELLANEOUS) ×4 IMPLANT
SCREW LOCK TI 5.0X48 F/IM NAIL (Screw) ×1 IMPLANT
SCREW LOCKING IM 5.0MX44M (Screw) ×1 IMPLANT
STAPLER VISISTAT 35W (STAPLE) ×2 IMPLANT
STRIP CLOSURE SKIN 1/2X4 (GAUZE/BANDAGES/DRESSINGS) ×2 IMPLANT
SUT VIC AB 0 CTB1 27 (SUTURE) ×3 IMPLANT
SUT VIC AB 2-0 FS1 27 (SUTURE) ×2 IMPLANT
SUT VIC AB 2-0 SH 27 (SUTURE)
SUT VIC AB 2-0 SH 27XBRD (SUTURE) IMPLANT
SUT VIC AB 3-0 SH 8-18 (SUTURE) ×2 IMPLANT
TOWEL OR 17X24 6PK STRL BLUE (TOWEL DISPOSABLE) ×2 IMPLANT
TOWEL OR 17X26 10 PK STRL BLUE (TOWEL DISPOSABLE) ×2 IMPLANT
WATER STERILE IRR 1000ML POUR (IV SOLUTION) ×2 IMPLANT

## 2012-07-15 NOTE — Transfer of Care (Signed)
Immediate Anesthesia Transfer of Care Note  Patient: [name] [name]  Procedure(s) Performed: Procedure(s): INTRAMEDULLARY (IM) NAIL HIP (Right)  Patient Location: PACU  Anesthesia Type:General  Level of Consciousness: awake, alert , oriented and sedated  Airway & Oxygen Therapy: Patient Spontanous Breathing and Patient connected to nasal cannula oxygen  Post-op Assessment: Report given to PACU RN, Post -op Vital signs reviewed and stable and Patient moving all extremities  Post vital signs: Reviewed and stable  Complications: No apparent anesthesia complications

## 2012-07-15 NOTE — Telephone Encounter (Signed)
fyi - pt's husband called to canc 07/20/12 appt w/ dr. [name]. Says his wife is in the hospital w/ a broken hip / Sherri S.

## 2012-07-15 NOTE — Op Note (Signed)
DATE OF SURGERY:  07/15/2012  TIME: 12:57 PM  PATIENT NAME:  [name] [name]  AGE: 72 y.o.  PRE-OPERATIVE DIAGNOSIS:  Right reverse obliquity intertrochanteric hip fracture  POST-OPERATIVE DIAGNOSIS:  SAME  PROCEDURE:  INTRAMEDULLARY (IM) NAIL HIP  SURGEON:  Herve Haug P  ASSISTANT:  Joya Gaskins, OPA-C, present and scrubbed throughout the case, critical for assistance with exposure, retraction, instrumentation, and closure.  OPERATIVE IMPLANTS: Synthes trochanteric femoral nail with interlocking helical blade into the femoral head and one distal interlocking bolt.  PREOPERATIVE INDICATIONS:  [name] [name] is a 72 y.o. year old who fell and suffered a hip fracture. She was brought into the ER and then admitted and optimized and then elected for surgical intervention.    The risks benefits and alternatives were discussed with the patient including but not limited to the risks of nonoperative treatment, versus surgical intervention including infection, bleeding, nerve injury, malunion, nonunion, hardware prominence, hardware failure, need for hardware removal, blood clots, cardiopulmonary complications, morbidity, mortality, among others, and they were willing to proceed.    OPERATIVE PROCEDURE:  The patient was brought to the operating room and placed in the supine position. General anesthesia was administered, with a foley. She was placed on the fracture table.  Closed reduction was performed under C-arm guidance. The length of the femur was also measured using fluoroscopy. Time out was then performed after sterile prep and drape. She received preoperative antibiotics.  Incision was made proximal to the greater trochanter. A guidewire was placed in the appropriate position. Confirmation was made on AP and lateral views. The above-named nail was opened. I opened the proximal femur with a reamer. I then placed the nail by hand easily down. I did not need to ream the femur.  Once  the nail was completely seated, I placed a guidepin into the femoral head into the center center position. I measured the length, and then reamed the lateral cortex and up into the head. I then placed the helical blade. Slight compression was applied. Anatomic fixation achieved. The lateral cortex tended to kick out laterally due to the shape of the nail, but overall the medial calcar was restored. Bone quality was mediocre.  I then secured the proximal interlocking bolt, locked it completely, and then removed the instruments.  I placed a distal interlocking bolts using perfect circles, do to the length instability of the fracture, as well as the rotation instability.  I took final C-arm pictures AP and lateral the entire length of the leg. Anatomic reconstruction was achieved, and the wounds were irrigated copiously and closed with Vicryl followed by Steri-Strips and sterile gauze for the skin. The patient was awakened and returned to PACU in stable and satisfactory condition. There no complications and the patient tolerated the procedure well.  She will be weightbearing as tolerated, and will be on Lovenox bridging to Coumadin.   Marchia Bond, M.D.

## 2012-07-15 NOTE — Anesthesia Preprocedure Evaluation (Signed)
Anesthesia Evaluation  Patient identified by MRN, date of birth, ID band Patient awake    Reviewed: Allergy & Precautions, H&P , NPO status , Patient's Chart, lab work & pertinent test results, reviewed documented beta blocker date and time   Airway Mallampati: II TM Distance: >3 FB Neck ROM: full    Dental   Pulmonary neg pulmonary ROS,  breath sounds clear to auscultation        Cardiovascular hypertension, On Medications and On Home Beta Blockers + CAD and + Peripheral Vascular Disease + dysrhythmias Atrial Fibrillation Rhythm:regular     Neuro/Psych  Headaches, Seizures -, Well Controlled,   Neuromuscular disease negative psych ROS   GI/Hepatic Neg liver ROS, GERD-  Medicated and Controlled,  Endo/Other  diabetes, Insulin Dependent  Renal/GU negative Renal ROS  negative genitourinary   Musculoskeletal   Abdominal   Peds  Hematology negative hematology ROS (+)   Anesthesia Other Findings See surgeon's H&P   Reproductive/Obstetrics negative OB ROS                           Anesthesia Physical Anesthesia Plan  ASA: III  Anesthesia Plan: General   Post-op Pain Management:    Induction: Intravenous  Airway Management Planned: Oral ETT  Additional Equipment:   Intra-op Plan:   Post-operative Plan: Extubation in OR  Informed Consent: I have reviewed the patients History and Physical, chart, labs and discussed the procedure including the risks, benefits and alternatives for the proposed anesthesia with the patient or authorized representative who has indicated his/her understanding and acceptance.   Dental Advisory Given  Plan Discussed with: CRNA and Surgeon  Anesthesia Plan Comments:         Anesthesia Quick Evaluation

## 2012-07-15 NOTE — Anesthesia Postprocedure Evaluation (Signed)
Anesthesia Post Note  Patient: [name] [name]  Procedure(s) Performed: Procedure(s) (LRB): INTRAMEDULLARY (IM) NAIL HIP (Right)  Anesthesia type: general  Patient location: PACU  Post pain: Pain level controlled  Post assessment: Patient's Cardiovascular Status Stable  Last Vitals:  Filed Vitals:   07/15/12 1329  BP: 140/40  Pulse: 67  Temp:   Resp: 15    Post vital signs: Reviewed and stable  Level of consciousness: sedated  Complications: No apparent anesthesia complications

## 2012-07-15 NOTE — Progress Notes (Signed)
ANTICOAGULATION CONSULT NOTE - Initial Consult  Pharmacy Consult for coumadin Indication: atrial fibrillation  Allergies  Allergen Reactions  . Procaine Hcl Anaphylaxis    Patient Measurements: Height: 5' 8.11" (173 cm) Weight: 175 lb (79.379 kg) IBW/kg (Calculated) : 64.15   Vital Signs: Temp: 97.7 F (36.5 C) (05/16 1440) Temp src: Oral (05/16 0444) BP: 146/67 mmHg (05/16 1440) Pulse Rate: 67 (05/16 1329)  Labs:  Recent Labs  07/13/12 1935 07/14/12 0130 07/14/12 0950 07/14/12 1634  HGB 11.8* 10.3*  --   --   HCT 33.9* 30.6*  --   --   PLT 255 219  --   --   LABPROT 20.1* 21.4* 20.8*  --   INR 1.78* 1.94* 1.87*  --   HEPARINUNFRC  --   --  0.37 0.51  CREATININE 0.93 0.88  --   --     Estimated Creatinine Clearance: 66 ml/min (by C-G formula based on Cr of 0.88).   Medical History: Past Medical History  Diagnosis Date  . Blindness of left eye   . Eye muscle weakness     Right eye weakness after cataract surgery  . Common migraine     ?hx of  . DIABETES MELLITUS, TYPE II 03/27/2006  . HYPERLIPIDEMIA 03/27/2006  . 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 HYPERTENSION 03/27/2006  . Atrial fibrillation     SVT dx 2007, cath 2007 mild CAD, then had an cardioversion, ablation; still on coumadin , has occ palpitation, EKG 03-2010 NSR  . Headache 05/07/2009  . LUNG NODULE 09/01/2006    Excision, Bx Benighn  . Osteopenia 2004    Dexa 2004 showed Osteopenia, DEXA 03/2007 normal  . Seizures   . Intertrochanteric fracture of right hip 07/13/2012    Medications:  Prescriptions prior to admission  Medication Sig Dispense Refill  . albuterol (VENTOLIN HFA) 108 (90 BASE) MCG/ACT inhaler Inhale 2 puffs into the lungs every 6 (six) hours as needed for wheezing.  1 Inhaler  1  . aspirin EC 81 MG tablet Take 81 mg by mouth daily.      . calcium-vitamin D (OSCAL WITH D) 500-200 MG-UNIT per tablet Take 1 tablet by mouth daily.      [name] diltiazem (CARDIZEM CD) 240 MG 24 hr  capsule Take 240 mg by mouth daily.      . insulin NPH-regular (HUMULIN 70/30) (70-30) 100 UNIT/ML injection Inject 35 Units into the skin daily with breakfast.  10 mL  3  . metoprolol tartrate (LOPRESSOR) 25 MG tablet Take 0.5 tablets (12.5 mg total) by mouth 2 (two) times daily.  60 tablet  12  . Multiple Vitamin (MULTIVITAMIN WITH MINERALS) TABS Take 1 tablet by mouth daily.      . phenytoin (DILANTIN) 100 MG ER capsule Take 100 mg by mouth 3 (three) times daily. Taken with 50mg  tablet per dose to equal 150mg .      . phenytoin (DILANTIN) 50 MG tablet Chew 50 mg by mouth 3 (three) times daily. Taken with 100mg  capsule per dose to equal 150mg .      . pravastatin (PRAVACHOL) 40 MG tablet Take 1 tablet (40 mg total) by mouth daily.  30 tablet  12  . ranitidine (ZANTAC) 300 MG tablet Take 1 tablet (300 mg total) by mouth at bedtime.  30 tablet  3  . warfarin (COUMADIN) 2.5 MG tablet Take 1.25-2.5 mg by mouth daily. 0.5 tab daily except for 1 tab on Fridays.       Scheduled:  .  aspirin EC  81 mg Oral Daily  . calcium-vitamin D  1 tablet Oral Daily  .  ceFAZolin (ANCEF) IV  2 g Intravenous Q6H  . diltiazem  240 mg Oral Daily  . docusate sodium  100 mg Oral BID  . famotidine  20 mg Oral Daily  . fentaNYL      . heparin subcutaneous  5,000 Units Subcutaneous Q8H  . insulin aspart  0-20 Units Subcutaneous Q4H  . insulin aspart  0-5 Units Subcutaneous QHS  . insulin glargine  15 Units Subcutaneous QHS  . metoprolol tartrate  12.5 mg Oral BID  . multivitamin with minerals  1 tablet Oral Daily  . pravastatin  40 mg Oral q1800  . senna  1 tablet Oral BID    Assessment: 72 yo female with afib on coumadin PTA. Patient is here with fall and POD #0  for right hip repair.  Patient to resume coumadin today and INR noted at 1.87.  Patient is also noted on heparin sq q8h.  Home coumadin dose: 1.25mg /day except 2.5mg  on Fri.  Goal of Therapy:  INR 2-3 Monitor platelets by anticoagulation protocol:  Yes   Plan:  -Coumadin 2.5mg  today -Daily PT/INR  Hildred Laser, Pharm D 07/15/2012 3:04 PM

## 2012-07-15 NOTE — Progress Notes (Signed)
The patient has been re-examined, and the chart reviewed, and there have been no interval changes to the documented history and physical.    The risks, benefits, and alternatives have been discussed at length, and the patient is willing to proceed.    I have confirmed the right hip is the source of pain, EHL and FHL are firing, x-rays demonstrate right subtrochanteric/intertrochanteric hip fracture. We will plan to proceed with intramedullary nail as discussed.  Johnny Bridge, MD

## 2012-07-15 NOTE — Progress Notes (Signed)
Orthopedic Tech Progress Note Patient Details:  [name] [name] [birth year] [id number]  Patient ID: [name], female   DOB: Oct 19, 1940, 72 y.o.   MRN: [id number]  Trapeze bar patient helper Hildred Priest 07/15/2012, 3:44 PM

## 2012-07-15 NOTE — Progress Notes (Signed)
[facility] PROGRESS NOTE  [name] [name] [id number] DOB: [birth year] DOA: 07/13/2012 PCP: [name], MD  Assessment/Plan: 1. Fall with femoral intertrochanteric fracture: Patient is status post surgery. Reports doing well. We will defer management to the orthopedic service for 2. Atrial fibrillation: This appears be controlled at present. Patient currently on heparin drip for bridging. We will plan to transition back to Coumadin once it is okay per the orthopedic service. 3. Diabetes: Patient is currently maintained on a sliding scale regimen with Lantus. Blood sugars have been otherwise stable. 4. DVT prophylaxis: Patient is on therapeutic anticoagulation  Code Status: Full  Family Communication: Patient in the room  (indicate person spoken with, relationship, and if by phone, the number) Disposition Plan: Pending   Consultants:  Orthopedic surgery  Cardiology  Procedures:  Intramedullary nail hip  Antibiotics:  Cefazolin, day 1  (indicate start date, and stop date if known)  HPI/Subjective: The patient is asleep easily arousable without any complaints.   Objective: Filed Vitals:   07/15/12 1430 07/15/12 1439 07/15/12 1440 07/15/12 1600  BP:   146/67   Pulse: 67 65    Temp:   97.7 F (36.5 C)   TempSrc:      Resp: 10  14 17   Height:      Weight:      SpO2: 100%  100% 98%    Intake/Output Summary (Last 24 hours) at 07/15/12 1821 Last data filed at 07/15/12 1440  Gross per 24 hour  Intake   3400 ml  Output   3150 ml  Net    250 ml   Filed Weights   07/13/12 1842  Weight: 79.379 kg (175 lb)    Exam:   General:  Patient is asleep but arousable, in no apparent distress   Cardiovascular: Regular S1-S2   Respiratory: Normal respiratory effort no crackles no wheezing   Abdomen: Soft positive bowel sounds no organ   Musculoskeletal: Well perfused no clubbing or cyanosis   Data Reviewed: Basic Metabolic Panel:  Recent Labs Lab  07/13/12 1935 07/14/12 0130 07/15/12 1519  NA 137 133*  --   K 3.4* 3.8  --   CL 97 97  --   CO2 27 28  --   GLUCOSE 80 228*  --   BUN 15 15  --   CREATININE 0.93 0.88 0.79  CALCIUM 9.4 8.3*  --    Liver Function Tests: No results found for this basename: AST, ALT, ALKPHOS, BILITOT, PROT, ALBUMIN,  in the last 168 hours No results found for this basename: LIPASE, AMYLASE,  in the last 168 hours No results found for this basename: AMMONIA,  in the last 168 hours CBC:  Recent Labs Lab 07/13/12 1935 07/14/12 0130 07/15/12 1519  WBC 8.2 10.0 14.4*  NEUTROABS 5.6  --   --   HGB 11.8* 10.3* 10.1*  HCT 33.9* 30.6* 29.2*  MCV 89.4 89.5 90.1  PLT 255 219 187   Cardiac Enzymes: No results found for this basename: CKTOTAL, CKMB, CKMBINDEX, TROPONINI,  in the last 168 hours BNP (last 3 results) No results found for this basename: PROBNP,  in the last 8760 hours CBG:  Recent Labs Lab 07/15/12 0440 07/15/12 0813 07/15/12 1008 07/15/12 1327 07/15/12 1619  GLUCAP 131* 173* 124* 146* 197*    No results found for this or any previous visit (from the past 240 hour(s)).   Studies: Dg Chest 2 View  07/13/2012   *RADIOLOGY REPORT*  Clinical Data: Fall, right rib  pain.  CHEST - 2 VIEW  Comparison: 03/03/2012  Findings: Old healed right sixth rib fracture.  No acute rib fracture.  Heart is enlarged.  No confluent airspace opacities in the lungs.  Mild interstitial prominence.  The lateral view is suboptimal as the patient's arm is down by her side.  No visible effusions.  IMPRESSION: No acute findings.  No visible acute rib fracture.  Old healed right sixth rib fracture.   Original Report Authenticated By: Rolm Baptise, M.D.   Dg Shoulder Right  07/13/2012   *RADIOLOGY REPORT*  Clinical Data: Fall, right shoulder pain.  RIGHT SHOULDER - 2+ VIEW  Comparison: 08/12/2011  Findings: Degenerative changes in the right [facility] and glenohumeral joint.  Loss of some acromial space compatible with  rotator cuff disease.  No fracture, subluxation or dislocation.  Old healed right sixth rib fracture.  IMPRESSION: No acute bony abnormality.  Degenerative changes.   Original Report Authenticated By: Rolm Baptise, M.D.   Dg Hip Complete Right  07/13/2012   *RADIOLOGY REPORT*  Clinical Data: Fall and hip pain.  RIGHT HIP - COMPLETE 2+ VIEW  Comparison: None.  Findings: There is a mildly displaced fracture involving the proximal right femur.  This appears to represent an intertrochanteric fracture.  The right hip is located. Pelvic bony ring is intact.  IMPRESSION: Mildly displaced right femoral intertrochanteric fracture.   Original Report Authenticated By: Markus Daft, M.D.   Dg Femur Right  07/15/2012   *RADIOLOGY REPORT*  Clinical Data: Right intratrochanteric fracture.  DG C-ARM 1-60 MIN,RIGHT FEMUR - 2 VIEW  Fluoroscopic time:  1 minute and 39 seconds.  Comparison:  07/13/2012.  Findings: Five intraoperative C-arm views of the right femur submitted for review after surgery.  Right intratrochanteric fracture has been treated with long stem right femoral intramedullary rod with distal fixation and proximal sliding type screw.  Intratrochanteric fracture fragments remain slightly separated.  IMPRESSION: Right intratrochanteric fracture has been treated with long stem right femoral intramedullary rod with distal fixation and proximal sliding type screw.  Intratrochanteric fracture fragments remain slightly separated   Original Report Authenticated By: Genia Del, M.D.   Ct Head Wo Contrast  07/13/2012   *RADIOLOGY REPORT*  Clinical Data:  Fall.  Injury to head and neck.  Fracture tip.  CT HEAD WITHOUT CONTRAST CT CERVICAL SPINE WITHOUT CONTRAST  Technique:  Multidetector CT imaging of the head and cervical spine was performed following the standard protocol without intravenous contrast.  Multiplanar CT image reconstructions of the cervical spine were also generated.  Comparison:  11/05/2011  CT HEAD   Findings: Old area of encephalomalacia in the left temporal lobe. No acute infarction.  No hemorrhage or hydrocephalus.  No mass effect midline shift.  No extra-axial fluid collection.  No acute calvarial abnormality. Visualized paranasal sinuses and mastoids clear.  Orbital soft tissues unremarkable.  IMPRESSION: Stable left temporal encephalomalacia. No acute intracranial abnormality.  CT CERVICAL SPINE  Findings: Degenerative disc and facet disease throughout the cervical spine, most pronounced in the mid and lower cervical spine.  Fusion across the C6-7 disc space.  Prevertebral soft tissues are normal.  No fracture.  No epidural or paraspinal hematoma.  IMPRESSION: Degenerative changes.  Fusion at C6-7.  No acute findings.   Original Report Authenticated By: Rolm Baptise, M.D.   Ct Cervical Spine Wo Contrast  07/13/2012   *RADIOLOGY REPORT*  Clinical Data:  Fall.  Injury to head and neck.  Fracture tip.  CT HEAD WITHOUT CONTRAST CT CERVICAL SPINE  WITHOUT CONTRAST  Technique:  Multidetector CT imaging of the head and cervical spine was performed following the standard protocol without intravenous contrast.  Multiplanar CT image reconstructions of the cervical spine were also generated.  Comparison:  11/05/2011  CT HEAD  Findings: Old area of encephalomalacia in the left temporal lobe. No acute infarction.  No hemorrhage or hydrocephalus.  No mass effect midline shift.  No extra-axial fluid collection.  No acute calvarial abnormality. Visualized paranasal sinuses and mastoids clear.  Orbital soft tissues unremarkable.  IMPRESSION: Stable left temporal encephalomalacia. No acute intracranial abnormality.  CT CERVICAL SPINE  Findings: Degenerative disc and facet disease throughout the cervical spine, most pronounced in the mid and lower cervical spine.  Fusion across the C6-7 disc space.  Prevertebral soft tissues are normal.  No fracture.  No epidural or paraspinal hematoma.  IMPRESSION: Degenerative changes.   Fusion at C6-7.  No acute findings.   Original Report Authenticated By: Rolm Baptise, M.D.   Dg Pelvis Portable  07/15/2012   *RADIOLOGY REPORT*  Clinical Data: Postop right hip fracture.  PORTABLE PELVIS,PORTABLE RIGHT HIP - 1 VIEW  Comparison: 07/13/2012 plainfilm and intraoperative exam 07/15/2012.  Findings: Right intertrochanteric fracture treated with long stem right femoral intramedullary rod with distal fixation and proximal dynamic type screw.  The distal aspect is not imaged on the current examination. Intratrochanteric fracture fragments remain slightly separated.  No complication noted.  IMPRESSION: Right intertrochanteric fracture treated with long stem right femoral intramedullary rod with distal fixation and proximal dynamic type screw.  The distal aspect is not imaged on the current examination.  Intratrochanteric fracture fragments remain slightly separated.  No complication noted.   Original Report Authenticated By: Genia Del, M.D.   Dg Hip Portable 1 View Right  07/15/2012   *RADIOLOGY REPORT*  Clinical Data: Postop right hip fracture.  PORTABLE PELVIS,PORTABLE RIGHT HIP - 1 VIEW  Comparison: 07/13/2012 plainfilm and intraoperative exam 07/15/2012.  Findings: Right intertrochanteric fracture treated with long stem right femoral intramedullary rod with distal fixation and proximal dynamic type screw.  The distal aspect is not imaged on the current examination. Intratrochanteric fracture fragments remain slightly separated.  No complication noted.  IMPRESSION: Right intertrochanteric fracture treated with long stem right femoral intramedullary rod with distal fixation and proximal dynamic type screw.  The distal aspect is not imaged on the current examination.  Intratrochanteric fracture fragments remain slightly separated.  No complication noted.   Original Report Authenticated By: Genia Del, M.D.   Dg C-arm 1-60 Min  07/15/2012   *RADIOLOGY REPORT*  Clinical Data: Right  intratrochanteric fracture.  DG C-ARM 1-60 MIN,RIGHT FEMUR - 2 VIEW  Fluoroscopic time:  1 minute and 39 seconds.  Comparison:  07/13/2012.  Findings: Five intraoperative C-arm views of the right femur submitted for review after surgery.  Right intratrochanteric fracture has been treated with long stem right femoral intramedullary rod with distal fixation and proximal sliding type screw.  Intratrochanteric fracture fragments remain slightly separated.  IMPRESSION: Right intratrochanteric fracture has been treated with long stem right femoral intramedullary rod with distal fixation and proximal sliding type screw.  Intratrochanteric fracture fragments remain slightly separated   Original Report Authenticated By: Genia Del, M.D.    Scheduled Meds: . aspirin EC  81 mg Oral Daily  . calcium-vitamin D  1 tablet Oral Daily  .  ceFAZolin (ANCEF) IV  2 g Intravenous Q6H  . diltiazem  240 mg Oral Daily  . docusate sodium  100 mg Oral BID  .  famotidine  20 mg Oral Daily  . fentaNYL      . heparin subcutaneous  5,000 Units Subcutaneous Q8H  . insulin aspart  0-20 Units Subcutaneous Q4H  . insulin aspart  0-5 Units Subcutaneous QHS  . insulin glargine  15 Units Subcutaneous QHS  . metoprolol tartrate  12.5 mg Oral BID  . multivitamin with minerals  1 tablet Oral Daily  . pravastatin  40 mg Oral q1800  . senna  1 tablet Oral BID  . warfarin  2.5 mg Oral ONCE-1800  . Warfarin - Pharmacist Dosing Inpatient   Does not apply q1800   Continuous Infusions: . 0.45 % NaCl with KCl 20 mEq / L 75 mL/hr at 07/15/12 1653    Principal Problem:   Intertrochanteric fracture of right hip Active Problems:   Atrial fibrillation   Seizure disorder   DM (diabetes mellitus)   Fall   Diabetes   Hypokalemia   Preop cardiovascular exam    Time spent: 20 minutes    Yovanni Frenette, [facility] Pager [phone]. If 7PM-7AM, please contact night-coverage at [email or web address], password [facility] 07/15/2012, 6:21  PM  LOS: 2 days

## 2012-07-15 NOTE — Preoperative (Signed)
Beta Blockers   Reason not to administer Beta Blockers:Not Applicable 

## 2012-07-16 DIAGNOSIS — E871 Hypo-osmolality and hyponatremia: Secondary | ICD-10-CM

## 2012-07-16 LAB — BASIC METABOLIC PANEL
BUN: 14 mg/dL (ref 6–23)
Calcium: 8.5 mg/dL (ref 8.4–10.5)
Chloride: 97 mEq/L (ref 96–112)
Creatinine, Ser: 1.05 mg/dL (ref 0.50–1.10)
GFR calc Af Amer: 61 mL/min — ABNORMAL LOW (ref >90)

## 2012-07-16 LAB — CBC
HCT: 23.7 % — ABNORMAL LOW (ref 36.0–46.0)
MCH: 31.1 pg (ref 26.0–34.0)
MCHC: 35 g/dL (ref 30.0–36.0)
MCV: 88.8 fL (ref 78.0–100.0)
RDW: 12.5 % (ref 11.5–15.5)
WBC: 9 10*3/uL (ref 4.0–10.5)

## 2012-07-16 LAB — GLUCOSE, CAPILLARY
Glucose-Capillary: 171 mg/dL — ABNORMAL HIGH (ref 70–99)
Glucose-Capillary: 234 mg/dL — ABNORMAL HIGH (ref 70–99)
Glucose-Capillary: 243 mg/dL — ABNORMAL HIGH (ref 70–99)

## 2012-07-16 LAB — PREPARE RBC (CROSSMATCH)

## 2012-07-16 MED ORDER — FAMOTIDINE 40 MG PO TABS
40.0000 mg | ORAL_TABLET | Freq: Every day | ORAL | Status: DC
  Filled 2012-07-16: qty 1

## 2012-07-16 MED ORDER — FUROSEMIDE 10 MG/ML IJ SOLN
20.0000 mg | Freq: Once | INTRAMUSCULAR | Status: AC
  Administered 2012-07-16: 20 mg via INTRAVENOUS
  Filled 2012-07-16: qty 2

## 2012-07-16 MED ORDER — INSULIN GLARGINE 100 UNIT/ML ~~LOC~~ SOLN
20.0000 [IU] | Freq: Every day | SUBCUTANEOUS | Status: DC
  Filled 2012-07-16 (×2): qty 0.2

## 2012-07-16 MED ORDER — FAMOTIDINE 20 MG PO TABS
20.0000 mg | ORAL_TABLET | Freq: Once | ORAL | Status: AC
  Administered 2012-07-16: 20 mg via ORAL
  Filled 2012-07-16: qty 1

## 2012-07-16 MED ORDER — FAMOTIDINE 40 MG PO TABS
40.0000 mg | ORAL_TABLET | Freq: Every day | ORAL | Status: DC
  Administered 2012-07-17 – 2012-07-19 (×3): 40 mg via ORAL
  Filled 2012-07-16 (×4): qty 1

## 2012-07-16 MED ORDER — ACETAMINOPHEN 325 MG PO TABS
650.0000 mg | ORAL_TABLET | Freq: Once | ORAL | Status: AC
  Administered 2012-07-16: 650 mg via ORAL
  Filled 2012-07-16: qty 2

## 2012-07-16 MED ORDER — INSULIN GLARGINE 100 UNIT/ML ~~LOC~~ SOLN: 25.0000 [IU] | Freq: Every day | SUBCUTANEOUS | Status: DC

## 2012-07-16 MED ORDER — WARFARIN SODIUM 4 MG PO TABS
4.0000 mg | ORAL_TABLET | Freq: Once | ORAL | Status: AC
  Administered 2012-07-16: 4 mg via ORAL
  Filled 2012-07-16: qty 1

## 2012-07-16 MED ORDER — INSULIN GLARGINE 100 UNIT/ML ~~LOC~~ SOLN
20.0000 [IU] | Freq: Every day | SUBCUTANEOUS | Status: DC
  Filled 2012-07-16: qty 0.2

## 2012-07-16 NOTE — Progress Notes (Signed)
Physical Therapy Evaluation Patient Details Name: [name] [name] MRN: [id number] DOB: May 15, 1940 Today's Date: 07/16/2012 Time: 0925-1016 PT Time Calculation (min): 51 min  PT Assessment / Plan / Recommendation Clinical Impression  Pt is 72 yo female s/p right femur fx and IM nail who is limited in mobility by pain and weakness. Recommend acute PT to address these issues and progress mobilty as well as decreasing burden of care. Recommend SNF at d/c.    PT Assessment  Patient needs continued PT services    Follow Up Recommendations  SNF;Supervision/Assistance - 24 hour    Does the patient have the potential to tolerate intense rehabilitation      Barriers to Discharge Inaccessible home environment multilevel home with bed and full bath upstairs    Equipment Recommendations  Rolling walker with 5" wheels    Recommendations for Other Services     Frequency Min 4X/week    Precautions / Restrictions Precautions Precautions: Fall Restrictions Weight Bearing Restrictions: Yes RUE Weight Bearing: Weight bearing as tolerated   Pertinent Vitals/Pain VSS      Mobility  Bed Mobility Bed Mobility: Supine to Sit;Sitting - Scoot to Edge of Bed Supine to Sit: 1: +2 Total assist;With rails Supine to Sit: Patient Percentage: 50% Sitting - Scoot to Edge of Bed: 1: +2 Total assist Sitting - Scoot to Edge of Bed: Patient Percentage: 50% Details for Bed Mobility Assistance: assisted pt in pivoting to EOB, right leg moved for her, assist at hips and trunk as well Transfers Transfers: Sit to Stand;Stand to Sit;Stand Pivot Transfers Sit to Stand: 1: +2 Total assist;From bed;With upper extremity assist Sit to Stand: Patient Percentage: 60% Stand to Sit: 1: +2 Total assist;To chair/3-in-1;With upper extremity assist Stand to Sit: Patient Percentage: 60% Stand Pivot Transfers: 1: +2 Total assist Stand Pivot Transfers: Patient Percentage: 60% Details for Transfer Assistance: vc's for  hand placement and sequencing, increased pain with mvmt and WB right LE. Facilitation for moving right leg Ambulation/Gait Ambulation/Gait Assistance: Not tested (comment) (unable) Assistive device: Rolling walker Stairs: No Wheelchair Mobility Wheelchair Mobility: No    Exercises General Exercises - Lower Extremity Ankle Circles/Pumps: AROM;Both;15 reps;Seated Quad Sets: AROM;Both;10 reps;Seated Heel Slides:  (HO given ) Hip ABduction/ADduction:  (HO given) Straight Leg Raises: 5 reps;AAROM;Right;Supine   PT Diagnosis: Difficulty walking;Abnormality of gait;Generalized weakness;Acute pain  PT Problem List: Decreased strength;Decreased range of motion;Decreased activity tolerance;Decreased balance;Decreased mobility;Decreased knowledge of use of DME;Decreased knowledge of precautions;Pain PT Treatment Interventions: DME instruction;Functional mobility training;Gait training;Therapeutic activities;Therapeutic exercise;Balance training;Patient/family education   PT Goals Acute Rehab PT Goals PT Goal Formulation: With patient Time For Goal Achievement: 07/23/12 Potential to Achieve Goals: Good Pt will go Supine/Side to Sit: with min assist PT Goal: Supine/Side to Sit - Progress: Goal set today Pt will go Sit to Supine/Side: with min assist PT Goal: Sit to Supine/Side - Progress: Goal set today Pt will go Sit to Stand: with min assist PT Goal: Sit to Stand - Progress: Goal set today Pt will go Stand to Sit: with min assist PT Goal: Stand to Sit - Progress: Goal set today Pt will Transfer Bed to Chair/Chair to Bed: with min assist PT Transfer Goal: Bed to Chair/Chair to Bed - Progress: Goal set today Pt will Ambulate: 16 - 50 feet;with min assist;with rolling walker PT Goal: Ambulate - Progress: Goal set today Pt will Perform Home Exercise Program: with min assist PT Goal: Perform Home Exercise Program - Progress: Goal set today  Visit Information  Last  PT Received On:  07/16/12 Assistance Needed: +2 PT/OT Co-Evaluation/Treatment: Yes    Subjective Data  Subjective: pt wants to move and get OOB Patient Stated Goal: return home but agreeable to rehab first   Prior [place] Lives With: Spouse Available Help at Discharge: Family;Available PRN/intermittently Type of Home: House Home Access: Stairs to enter [employer] of Steps: 1 Entrance Stairs-Rails: None Home Layout: Multi-level;Able to live on main level with bedroom/bathroom (1/2 bath) Alternate Level Stairs-Number of Steps: flight Bathroom Shower/Tub: Walk-in shower;Tub/shower unit Armed forces training and education officer: Yes How Accessible: Accessible via walker Home Adaptive Equipment: None Prior Function Level of Independence: Independent Able to Take Stairs?: Yes Driving: No Vocation: Retired Corporate investment banker: No difficulties Dominant Hand: Right    Cognition  Cognition Arousal/Alertness: Awake/alert Behavior During Therapy: WFL for tasks assessed/performed Overall Cognitive Status: Within Functional Limits for tasks assessed    Extremity/Trunk Assessment Right Lower Extremity Assessment RLE ROM/Strength/Tone: Deficits;Due to pain RLE ROM/Strength/Tone Deficits: pt unable to move right leg against or perpendicular to gravity this morning, quad 2/5, ankle >3/5 RLE Sensation: WFL - Light Touch RLE Coordination: Deficits RLE Coordination Deficits: due to surgery Left Lower Extremity Assessment LLE ROM/Strength/Tone: Deficits LLE ROM/Strength/Tone Deficits: this is pt's stronger side but still with generalized weakness noted, hip flex 3-/5, knee 3/5, ankle >3/5 LLE Sensation: WFL - Light Touch LLE Coordination: WFL - gross motor Trunk Assessment Trunk Assessment: Normal   Balance Balance Balance Assessed: Yes Static Standing Balance Static Standing - Balance Support: Bilateral upper extremity supported Static Standing - Level of  Assistance: 4: Min assist  End of Session PT - End of Session Equipment Utilized During Treatment: Gait belt Activity Tolerance: Patient limited by pain Patient left: in chair;with call bell/phone within reach;with family/visitor present Nurse Communication: Mobility status  GP   Leighton Roach, [place]  [place], [place] 07/16/2012, 11:31 AM

## 2012-07-16 NOTE — Evaluation (Signed)
Occupational Therapy Evaluation Patient Details Name: [name] [name] MRN: [id number] DOB: Jun 09, 1940 Today's Date: 07/16/2012 Time: [id number] OT Time Calculation (min): 18 min  OT Assessment / Plan / Recommendation Clinical Impression  This 72 yo female s/p fall and now s/p RLE ORIF of femur presents to acute OT with problems below. Will benefit from acute OT with follow up at SNF.    OT Assessment  Patient needs continued OT Services    Follow Up Recommendations  SNF    Barriers to Discharge Decreased caregiver support    Equipment Recommendations  None recommended by OT       Frequency  Min 2X/week    Precautions / Restrictions Precautions Precautions: Fall Restrictions Weight Bearing Restrictions: Yes RUE Weight Bearing: Weight bearing as tolerated   Pertinent Vitals/Pain 8/10 right hip, repositioned    ADL  Eating/Feeding: Simulated;Independent Where Assessed - Eating/Feeding: Edge of bed Grooming: Simulated;Set up Where Assessed - Grooming: Unsupported sitting Upper Body Bathing: Simulated;Set up Where Assessed - Upper Body Bathing: Unsupported sitting Lower Body Bathing: Simulated;Maximal assistance Where Assessed - Lower Body Bathing: Supported sit to stand Upper Body Dressing: Simulated;Set up Where Assessed - Upper Body Dressing: Unsupported sitting Lower Body Dressing: Simulated;+1 Total assistance Where Assessed - Lower Body Dressing: Supported sit to stand Toilet Transfer: Simulated;+2 Total assistance Toilet Transfer: Patient Percentage: 60% Armed forces technical officer Method: Arts development officer:  (Bed>recliner going to pt's right) Toileting - Water quality scientist and Hygiene: Simulated;+1 Total assistance (with an additional person to maintain standing) Where Assessed - Toileting Clothing Manipulation and Hygiene: Standing Equipment Used: Gait belt;Rolling walker Transfers/Ambulation Related to ADLs: total A =2 (pt=60%) for all    OT  Diagnosis: Generalized weakness;Acute pain  OT Problem List: Decreased strength;Decreased activity tolerance;Pain;Decreased knowledge of use of DME or AE OT Treatment Interventions: Self-care/ADL training;Balance training;Therapeutic activities;DME and/or AE instruction;Patient/family education   OT Goals Acute Rehab OT Goals OT Goal Formulation: With patient Time For Goal Achievement: 07/23/12 Potential to Achieve Goals: Good ADL Goals Pt Will Perform Grooming: Standing at sink;Unsupported (min guard A) ADL Goal: Grooming - Progress: Goal set today Pt Will Perform Lower Body Dressing: with min assist;Sit to stand from chair;Sit to stand from bed;with adaptive equipment;Supported ADL Goal: Lower Body Dressing - Progress: Goal set today Pt Will Transfer to Toilet: with min assist;Ambulation;with DME;Comfort height toilet;Grab bars;3-in-1 ADL Goal: Toilet Transfer - Progress: Goal set today Pt Will Perform Toileting - Clothing Manipulation: with min assist;Standing ADL Goal: Toileting - Clothing Manipulation - Progress: Goal set today Pt Will Perform Toileting - Hygiene: with min assist;Sit to stand from 3-in-1/toilet ADL Goal: Toileting - Hygiene - Progress: Goal set today Miscellaneous OT Goals Miscellaneous OT Goal #1: Pt will be min A OOB for BADLs OT Goal: Miscellaneous Goal #1 - Progress: Goal set today  Visit Information  Last OT Received On: 07/16/12 Assistance Needed: +2 PT/OT Co-Evaluation/Treatment: Yes (partial)    Subjective Data  Subjective: I need to sit down (not long after standing up from bed)   Prior [place] Lives With: Spouse Available Help at Discharge: Family;Available PRN/intermittently Type of Home: House Home Access: Stairs to enter [employer] of Steps: 1 Entrance Stairs-Rails: None Home Layout: Multi-level;Able to live on main level with bedroom/bathroom Alternate Level Stairs-Number of Steps: flight Bathroom  Shower/Tub: Walk-in shower;Tub/shower unit Bathroom Toilet: Standard Bathroom Accessibility: Yes How Accessible: Accessible via walker Home Adaptive Equipment: None Prior Function Level of Independence: Independent Able to Take Stairs?:  Yes Driving: No Vocation: Retired Corporate investment banker: No difficulties Dominant Hand: Right         Vision/Perception Vision - History Baseline Vision: No visual deficits Patient Visual Report: No change from baseline   Cognition  Cognition Arousal/Alertness: Awake/alert Behavior During Therapy: WFL for tasks assessed/performed Overall Cognitive Status: Within Functional Limits for tasks assessed    Extremity/Trunk Assessment Right Upper Extremity Assessment RUE ROM/Strength/Tone:  (grossly 3/5) Left Upper Extremity Assessment LUE ROM/Strength/Tone:  (grossly 3/5)     Mobility Bed Mobility Bed Mobility: Supine to Sit;Sitting - Scoot to Edge of Bed Supine to Sit: 1: +2 Total assist;With rails Supine to Sit: Patient Percentage: 50% Sitting - Scoot to Edge of Bed: 1: +2 Total assist Sitting - Scoot to Edge of Bed: Patient Percentage: 50% Details for Bed Mobility Assistance: assisted pt in pivoting to EOB, right leg moved for her, assist at hips and trunk as well Transfers Transfers: Sit to Stand;Stand to Sit Sit to Stand: 1: +2 Total assist;From bed;With upper extremity assist Sit to Stand: Patient Percentage: 60% Stand to Sit: 1: +2 Total assist;To chair/3-in-1;With upper extremity assist Stand to Sit: Patient Percentage: 60% Details for Transfer Assistance: vc's for hand placement and sequencing, increased pain with mvmt and WB right LE. Facilitation for moving right leg        Balance Balance Balance Assessed: Yes Static Standing Balance Static Standing - Balance Support: Bilateral upper extremity supported Static Standing - Level of Assistance: 4: Min assist   End of Session OT - End of Session Equipment Utilized  During Treatment: Gait belt Activity Tolerance: Patient limited by fatigue;Patient limited by pain Patient left: in chair;with call bell/phone within reach       Almon Register [id number] 07/16/2012, 4:42 PM

## 2012-07-16 NOTE — Progress Notes (Signed)
[facility] Progress Note    1 Day Post-Op  Subjective   Doing ok Sitting in bedside chair Pain tolerable No CP or SOB No dizziness Ate breakfast this am, tolerated + flatus No BM    Objective  BP 115/48  Pulse 85  Temp(Src) 98.8 F (37.1 C) (Oral)  Resp 20  Ht 5' 8.11" (1.73 m)  Wt 79.379 kg (175 lb)  BMI 26.52 kg/m2  SpO2 99%  Intake/Output     05/16 0701 - 05/17 0700 05/17 0701 - 05/18 0700   P.O.     I.V. (mL/kg) 2838.8 (35.8)    IV Piggyback 250    Total Intake(mL/kg) 3088.8 (38.9)    Urine (mL/kg/hr) 2200 (1.2)    Blood 300 (0.2)    Total Output 2500     Net +588.8            Labs Results for GEORGENIA, DUNAWAY (MRN [id number]) as of 07/16/2012 11:11  Ref. Range 07/16/2012 05:10  Sodium Latest Range: 135-145 mEq/L 132 (L)  Potassium Latest Range: 3.5-5.1 mEq/L 4.3  Chloride Latest Range: 96-112 mEq/L 97  CO2 Latest Range: 19-32 mEq/L 27  BUN Latest Range: 6-23 mg/dL 14  Creatinine Latest Range: 0.50-1.10 mg/dL 1.05  Calcium Latest Range: 8.4-10.5 mg/dL 8.5  GFR calc non Af Amer Latest Range: >90 mL/min 53 (L)  GFR calc Af Amer Latest Range: >90 mL/min 61 (L)  Glucose Latest Range: 70-99 mg/dL 182 (H)  WBC Latest Range: 4.0-10.5 K/uL 9.0  RBC Latest Range: 3.87-5.11 MIL/uL 2.67 (L)  Hemoglobin Latest Range: 12.0-15.0 g/dL 8.3 (L)  HCT Latest Range: 36.0-46.0 % 23.7 (L)  MCV Latest Range: 78.0-100.0 fL 88.8  MCH Latest Range: 26.0-34.0 pg 31.1  MCHC Latest Range: 30.0-36.0 g/dL 35.0  RDW Latest Range: 11.5-15.5 % 12.5  Platelets Latest Range: 150-400 K/uL 186  Prothrombin Time Latest Range: 11.6-15.2 seconds 15.7 (H)  INR Latest Range: 0.00-1.49  1.28    Exam  Gen:  Awake and alert, NAD  Lungs: clear B  Cardiac: reg s1 and s2 Abd: + BS, NT  Ext:            Right Lower Extremity    Dressings c/d/i  Swelling controlled  DPN, SPN, TN sensation intact  Ext warm  + DP pulse  No DCT  Compartments soft and nt  EHL FHL, AT,  PT, peroneals, gastroc motor intact         Assessment and Plan  1 Day Post-Op  72 y/o female s/p fall  1. Fall 2. R reverse obliquity IT femur fx POD 1  WBAT   ROM as tolerated  PT/OT  Ice and elevate   3. ABL anemia:  Pt with soft BP's   Moderate drop in H/H  Will transfuse 1 unit PRBC's today  4. Pain management:  Minimize narcotics  Tylenol   5. DVT/PE prophylaxis:  Heparin bridge to coumadin  TED's  SCD's 6. FEN/Foley/Lines:  Foley d/c'd already  IVF  Advance diet as tolerated   Hyponatremia- monitor   7. Dispo:  PT/OT  SNF when stable  Likely Monday    Jari Pigg, PA-C [facility] [phone] (P) 07/16/2012 11:10 AM

## 2012-07-16 NOTE — Progress Notes (Signed)
ANTICOAGULATION CONSULT NOTE - Follow Up Consult   Pharmacy Consult :  Coumadin Indication: atrial fibrillation  Dosing Weight: 79 kg  Labs:  Recent Labs  07/14/12 0130 07/14/12 0950 07/14/12 1634 07/15/12 1519 07/16/12 0510  HGB 10.3*  --   --  10.1* 8.3*  HCT 30.6*  --   --  29.2* 23.7*  PLT 219  --   --  187 186  INR 1.94* 1.87*  --   --  1.28  HEPARINUNFRC  --  0.37 0.51  --   --   CREATININE 0.88  --   --  0.79 1.05   Lab Results  Component Value Date   INR 1.28 07/16/2012   INR 1.87* 07/14/2012   INR 1.94* 07/14/2012   Estimated Creatinine Clearance: 55.3 ml/min (by C-G formula based on Cr of 1.05).  Medications:  Prescriptions prior to admission  Medication Sig Dispense Refill  . albuterol (VENTOLIN HFA) 108 (90 BASE) MCG/ACT inhaler Inhale 2 puffs into the lungs every 6 (six) hours as needed for wheezing.  1 Inhaler  1  . aspirin EC 81 MG tablet Take 81 mg by mouth daily.      . calcium-vitamin D (OSCAL WITH D) 500-200 MG-UNIT per tablet Take 1 tablet by mouth daily.      [name] diltiazem (CARDIZEM CD) 240 MG 24 hr capsule Take 240 mg by mouth daily.      . insulin NPH-regular (HUMULIN 70/30) (70-30) 100 UNIT/ML injection Inject 35 Units into the skin daily with breakfast.  10 mL  3  . metoprolol tartrate (LOPRESSOR) 25 MG tablet Take 0.5 tablets (12.5 mg total) by mouth 2 (two) times daily.  60 tablet  12  . Multiple Vitamin (MULTIVITAMIN WITH MINERALS) TABS Take 1 tablet by mouth daily.      . phenytoin (DILANTIN) 100 MG ER capsule Take 100 mg by mouth 3 (three) times daily. Taken with 50mg  tablet per dose to equal 150mg .      . phenytoin (DILANTIN) 50 MG tablet Chew 50 mg by mouth 3 (three) times daily. Taken with 100mg  capsule per dose to equal 150mg .      . pravastatin (PRAVACHOL) 40 MG tablet Take 1 tablet (40 mg total) by mouth daily.  30 tablet  12  . ranitidine (ZANTAC) 300 MG tablet Take 1 tablet (300 mg total) by mouth at bedtime.  30 tablet  3  . warfarin  (COUMADIN) 2.5 MG tablet Take 1.25-2.5 mg by mouth daily. 0.5 tab daily except for 1 tab on Fridays.       Scheduled:  . acetaminophen  650 mg Oral Once  . aspirin EC  81 mg Oral Daily  . calcium-vitamin D  1 tablet Oral Daily  . diltiazem  240 mg Oral Daily  . docusate sodium  100 mg Oral BID  . famotidine  20 mg Oral Daily  . furosemide  20 mg Intravenous Once  . heparin subcutaneous  5,000 Units Subcutaneous Q8H  . insulin aspart  0-20 Units Subcutaneous Q4H  . insulin aspart  0-5 Units Subcutaneous QHS  . insulin glargine  20 Units Subcutaneous QHS  . metoprolol tartrate  12.5 mg Oral BID  . multivitamin with minerals  1 tablet Oral Daily  . pravastatin  40 mg Oral q1800  . senna  1 tablet Oral BID  . Warfarin - Pharmacist Dosing Inpatient   Does not apply q1800    Assessment:  72 y/o female 1 Day Post-Op hip repair who has  resumed Coumadin with prior history of atrial fibrillation   Patient on Heparin sq q 8 hours as bridge to therapeutic INR.  INR 1.28, post-op Hgb 8.3.  No overt bleeding complications noted   Goal of Therapy:  INR 2-3   Plan:   Coumadin 4 mg today. Daily INR's, CBC. Monitor for bleeding complications   Estelle June, Pharm.D.  07/16/2012,11:21 AM

## 2012-07-16 NOTE — Progress Notes (Signed)
[facility] PROGRESS NOTE  [name] [name] [id number] DOB: Jul 23, 1940 DOA: 07/13/2012 PCP: [name], MD  Assessment/Plan: Fall with femoral intertrochanteric fracture: Patient is status post surgery. Reports doing well. We will continue to defer management to the orthopedic service  Atrial fibrillation: This appears be controlled at present. Patient currently on heparin drip for bridging. We will plan to transition back to Coumadin once it is okay per the orthopedic service.  Diabetes: Patient is currently maintained on a sliding scale regimen with Lantus. Blood sugars have been otherwise stable. We'll further optimize sugar control by increasing Lantus to 20 units and continue sliding scale regimen.  DVT prophylaxis: Patient is on therapeutic anticoagulation   Code Status: Full Family Communication: Family members are present in room, including son and husband (indicate person spoken with, relationship, and if by phone, the number) Disposition Plan: Pending   Consultants:  Orthopedic surgery  Cardiology  Procedures:  Intramedullary nail to the hip  Antibiotics:  2 doses of perioperative cefazolin (indicate start date, and stop date if known)  HPI/Subjective: Patient is without any major complaints. She states she feels somewhat better this morning.  Objective: Filed Vitals:   07/15/12 1440 07/15/12 1600 07/15/12 2224 07/16/12 0715  BP: 146/67  113/37 115/48  Pulse:   83 85  Temp: 97.7 F (36.5 C)  98.6 F (37 C) 98.8 F (37.1 C)  TempSrc:      Resp: 14 17 20 20   Height:      Weight:      SpO2: 100% 98% 99% 99%    Intake/Output Summary (Last 24 hours) at 07/16/12 1106 Last data filed at 07/16/12 0524  Gross per 24 hour  Intake 3088.75 ml  Output   2000 ml  Net 1088.75 ml   Filed Weights   07/13/12 1842  Weight: 79.379 kg (175 lb)    Exam:   General:  Patient's awake no apparent stress  Cardiovascular: Regular S1-S2  Respiratory:  Normal respiratory effort no crackles no wheezing  Abdomen: Soft positive bowel sound  Musculoskeletal: No clubbing or cyanosis well perfused   Data Reviewed: Basic Metabolic Panel:  Recent Labs Lab 07/13/12 1935 07/14/12 0130 07/15/12 1519 07/16/12 0510  NA 137 133*  --  132*  K 3.4* 3.8  --  4.3  CL 97 97  --  97  CO2 27 28  --  27  GLUCOSE 80 228*  --  182*  BUN 15 15  --  14  CREATININE 0.93 0.88 0.79 1.05  CALCIUM 9.4 8.3*  --  8.5   Liver Function Tests: No results found for this basename: AST, ALT, ALKPHOS, BILITOT, PROT, ALBUMIN,  in the last 168 hours No results found for this basename: LIPASE, AMYLASE,  in the last 168 hours No results found for this basename: AMMONIA,  in the last 168 hours CBC:  Recent Labs Lab 07/13/12 1935 07/14/12 0130 07/15/12 1519 07/16/12 0510  WBC 8.2 10.0 14.4* 9.0  NEUTROABS 5.6  --   --   --   HGB 11.8* 10.3* 10.1* 8.3*  HCT 33.9* 30.6* 29.2* 23.7*  MCV 89.4 89.5 90.1 88.8  PLT 255 219 187 186   Cardiac Enzymes: No results found for this basename: CKTOTAL, CKMB, CKMBINDEX, TROPONINI,  in the last 168 hours BNP (last 3 results) No results found for this basename: PROBNP,  in the last 8760 hours CBG:  Recent Labs Lab 07/15/12 2024 07/15/12 2213 07/16/12 0018 07/16/12 0431 07/16/12 0856  GLUCAP 182* 176*  171* 168* 174*    No results found for this or any previous visit (from the past 240 hour(s)).   Studies: Dg Femur Right  07/15/2012   *RADIOLOGY REPORT*  Clinical Data: Right intratrochanteric fracture.  DG C-ARM 1-60 MIN,RIGHT FEMUR - 2 VIEW  Fluoroscopic time:  1 minute and 39 seconds.  Comparison:  07/13/2012.  Findings: Five intraoperative C-arm views of the right femur submitted for review after surgery.  Right intratrochanteric fracture has been treated with long stem right femoral intramedullary rod with distal fixation and proximal sliding type screw.  Intratrochanteric fracture fragments remain slightly  separated.  IMPRESSION: Right intratrochanteric fracture has been treated with long stem right femoral intramedullary rod with distal fixation and proximal sliding type screw.  Intratrochanteric fracture fragments remain slightly separated   Original Report Authenticated By: Genia Del, M.D.   Dg Pelvis Portable  07/15/2012   *RADIOLOGY REPORT*  Clinical Data: Postop right hip fracture.  PORTABLE PELVIS,PORTABLE RIGHT HIP - 1 VIEW  Comparison: 07/13/2012 plainfilm and intraoperative exam 07/15/2012.  Findings: Right intertrochanteric fracture treated with long stem right femoral intramedullary rod with distal fixation and proximal dynamic type screw.  The distal aspect is not imaged on the current examination. Intratrochanteric fracture fragments remain slightly separated.  No complication noted.  IMPRESSION: Right intertrochanteric fracture treated with long stem right femoral intramedullary rod with distal fixation and proximal dynamic type screw.  The distal aspect is not imaged on the current examination.  Intratrochanteric fracture fragments remain slightly separated.  No complication noted.   Original Report Authenticated By: Genia Del, M.D.   Dg Hip Portable 1 View Right  07/15/2012   *RADIOLOGY REPORT*  Clinical Data: Postop right hip fracture.  PORTABLE PELVIS,PORTABLE RIGHT HIP - 1 VIEW  Comparison: 07/13/2012 plainfilm and intraoperative exam 07/15/2012.  Findings: Right intertrochanteric fracture treated with long stem right femoral intramedullary rod with distal fixation and proximal dynamic type screw.  The distal aspect is not imaged on the current examination. Intratrochanteric fracture fragments remain slightly separated.  No complication noted.  IMPRESSION: Right intertrochanteric fracture treated with long stem right femoral intramedullary rod with distal fixation and proximal dynamic type screw.  The distal aspect is not imaged on the current examination.  Intratrochanteric fracture  fragments remain slightly separated.  No complication noted.   Original Report Authenticated By: Genia Del, M.D.   Dg C-arm 1-60 Min  07/15/2012   *RADIOLOGY REPORT*  Clinical Data: Right intratrochanteric fracture.  DG C-ARM 1-60 MIN,RIGHT FEMUR - 2 VIEW  Fluoroscopic time:  1 minute and 39 seconds.  Comparison:  07/13/2012.  Findings: Five intraoperative C-arm views of the right femur submitted for review after surgery.  Right intratrochanteric fracture has been treated with long stem right femoral intramedullary rod with distal fixation and proximal sliding type screw.  Intratrochanteric fracture fragments remain slightly separated.  IMPRESSION: Right intratrochanteric fracture has been treated with long stem right femoral intramedullary rod with distal fixation and proximal sliding type screw.  Intratrochanteric fracture fragments remain slightly separated   Original Report Authenticated By: Genia Del, M.D.    Scheduled Meds: . aspirin EC  81 mg Oral Daily  . calcium-vitamin D  1 tablet Oral Daily  . diltiazem  240 mg Oral Daily  . docusate sodium  100 mg Oral BID  . famotidine  20 mg Oral Daily  . heparin subcutaneous  5,000 Units Subcutaneous Q8H  . insulin aspart  0-20 Units Subcutaneous Q4H  . insulin aspart  0-5 Units Subcutaneous  QHS  . insulin glargine  20 Units Subcutaneous QHS  . metoprolol tartrate  12.5 mg Oral BID  . multivitamin with minerals  1 tablet Oral Daily  . pravastatin  40 mg Oral q1800  . senna  1 tablet Oral BID  . Warfarin - Pharmacist Dosing Inpatient   Does not apply q1800   Continuous Infusions: . 0.45 % NaCl with KCl 20 mEq / L 75 mL/hr at 07/16/12 [id number]    Principal Problem:   Intertrochanteric fracture of right hip Active Problems:   Atrial fibrillation   Seizure disorder   DM (diabetes mellitus)   Fall   Diabetes   Hypokalemia   Preop cardiovascular exam    Time spent: 25 minutes discussing care of the family and patient    CHIU,  [facility] Pager [phone]. If 7PM-7AM, please contact night-coverage at [email or web address], password [facility] 07/16/2012, 11:06 AM  LOS: 3 days

## 2012-07-17 LAB — TYPE AND SCREEN: Antibody Screen: NEGATIVE

## 2012-07-17 LAB — CBC
HCT: 24.4 % — ABNORMAL LOW (ref 36.0–46.0)
Hemoglobin: 8.6 g/dL — ABNORMAL LOW (ref 12.0–15.0)
RBC: 2.78 MIL/uL — ABNORMAL LOW (ref 3.87–5.11)
WBC: 9.8 10*3/uL (ref 4.0–10.5)

## 2012-07-17 LAB — PROTIME-INR
INR: 1.31 (ref 0.00–1.49)
Prothrombin Time: 16 seconds — ABNORMAL HIGH (ref 11.6–15.2)

## 2012-07-17 LAB — BASIC METABOLIC PANEL
BUN: 12 mg/dL (ref 6–23)
CO2: 31 mEq/L (ref 19–32)
Chloride: 97 mEq/L (ref 96–112)
GFR calc non Af Amer: 62 mL/min — ABNORMAL LOW (ref >90)
Glucose, Bld: 193 mg/dL — ABNORMAL HIGH (ref 70–99)
Potassium: 4.2 mEq/L (ref 3.5–5.1)
Sodium: 133 mEq/L — ABNORMAL LOW (ref 135–145)

## 2012-07-17 LAB — GLUCOSE, CAPILLARY
Glucose-Capillary: 173 mg/dL — ABNORMAL HIGH (ref 70–99)
Glucose-Capillary: 224 mg/dL — ABNORMAL HIGH (ref 70–99)

## 2012-07-17 MED ORDER — BISACODYL 5 MG PO TBEC: 5.0000 mg | DELAYED_RELEASE_TABLET | Freq: Every day | ORAL | Status: DC | PRN

## 2012-07-17 MED ORDER — LUBIPROSTONE 24 MCG PO CAPS
24.0000 ug | ORAL_CAPSULE | Freq: Two times a day (BID) | ORAL | Status: DC
  Administered 2012-07-17 – 2012-07-20 (×6): 24 ug via ORAL
  Filled 2012-07-17 (×10): qty 1

## 2012-07-17 MED ORDER — DSS 100 MG PO CAPS: 100.0000 mg | ORAL_CAPSULE | Freq: Two times a day (BID) | ORAL | Status: DC

## 2012-07-17 MED ORDER — WARFARIN SODIUM 6 MG PO TABS
6.0000 mg | ORAL_TABLET | Freq: Once | ORAL | Status: AC
  Administered 2012-07-17: 6 mg via ORAL
  Filled 2012-07-17: qty 1

## 2012-07-17 MED ORDER — INSULIN ASPART 100 UNIT/ML ~~LOC~~ SOLN: 0.0000 [IU] | Freq: Every day | SUBCUTANEOUS | Status: DC

## 2012-07-17 MED ORDER — INSULIN ASPART 100 UNIT/ML ~~LOC~~ SOLN
0.0000 [IU] | Freq: Three times a day (TID) | SUBCUTANEOUS | Status: DC
  Administered 2012-07-18: 7 [IU] via SUBCUTANEOUS
  Administered 2012-07-18: 4 [IU] via SUBCUTANEOUS
  Administered 2012-07-18: 7 [IU] via SUBCUTANEOUS
  Administered 2012-07-19: 3 [IU] via SUBCUTANEOUS
  Administered 2012-07-19 (×2): 7 [IU] via SUBCUTANEOUS
  Administered 2012-07-20 (×2): 4 [IU] via SUBCUTANEOUS

## 2012-07-17 MED ORDER — INSULIN GLARGINE 100 UNIT/ML ~~LOC~~ SOLN
30.0000 [IU] | Freq: Every day | SUBCUTANEOUS | Status: DC
  Administered 2012-07-18 – 2012-07-19 (×2): 30 [IU] via SUBCUTANEOUS
  Filled 2012-07-17 (×4): qty 0.3

## 2012-07-17 MED ORDER — HYDROCODONE-ACETAMINOPHEN 5-325 MG PO TABS: 1.0000 | ORAL_TABLET | Freq: Four times a day (QID) | ORAL | Status: DC | PRN

## 2012-07-17 MED ORDER — INSULIN ASPART 100 UNIT/ML ~~LOC~~ SOLN
5.0000 [IU] | Freq: Three times a day (TID) | SUBCUTANEOUS | Status: DC
  Administered 2012-07-17 – 2012-07-20 (×10): 5 [IU] via SUBCUTANEOUS

## 2012-07-17 NOTE — Progress Notes (Signed)
Increased Rt shoulder pain - acute-  severely affecting mobility and use with walker this afternoon . PA notified, shoulder x-ray done 5/14 with rotator cuff issue, etc. ??worsened issue.  Ice appiled. No further orders.

## 2012-07-17 NOTE — Progress Notes (Signed)
Physical Therapy Treatment Patient Details Name: [name] [name] MRN: [id number] DOB: [birth year] Today's Date: 07/17/2012 Time: [id number] PT Time Calculation (min): 21 min  PT Assessment / Plan / Recommendation Comments on Treatment Session  Making progress with mobiltiy, especially amb today    Follow Up Recommendations  SNF;Supervision/Assistance - 24 hour     Does the patient have the potential to tolerate intense rehabilitation     Barriers to Discharge        Equipment Recommendations  Rolling walker with 5" wheels    Recommendations for Other Services    Frequency Min 4X/week   Plan Discharge plan remains appropriate    Precautions / Restrictions Precautions Precautions: Fall Restrictions RUE Weight Bearing: Weight bearing as tolerated   Pertinent Vitals/Pain 9/10 pain in RLE; repositioned, elevated extremity    Mobility  Bed Mobility Details for Bed Mobility Assistance: Presented to PT in chair Transfers Transfers: Sit to Stand;Stand to Sit Sit to Stand: 1: +2 Total assist;With upper extremity assist;From chair/3-in-1 Sit to Stand: Patient Percentage:  (60-70%) Stand to Sit: 3: Mod assist Details for Transfer Assistance: Continued cues for safety, handplacement, and positioning of LEs Ambulation/Gait Ambulation/Gait Assistance: 1: +2 Total assist Ambulation/Gait: Patient Percentage: 60% Ambulation Distance (Feet): 25 Feet Assistive device: Rolling walker Ambulation/Gait Assistance Details: Near constant cues for sequenceing, technique, and for weight shifting off of advancing leg for stepping; Required physical assist for advancement of RLE Gait Pattern: Step-to pattern General Gait Details: Encouraged increasing step length    Exercises     PT Diagnosis:    PT Problem List:   PT Treatment Interventions:     PT Goals Acute Rehab PT Goals Time For Goal Achievement: 07/23/12 Potential to Achieve Goals: Good Pt will go Sit to Stand: with min  assist PT Goal: Sit to Stand - Progress: Progressing toward goal Pt will go Stand to Sit: with min assist PT Goal: Stand to Sit - Progress: Progressing toward goal Pt will Ambulate: 16 - 50 feet;with min assist;with rolling walker PT Goal: Ambulate - Progress: Progressing toward goal  Visit Information  Last PT Received On: 07/17/12 Assistance Needed: +2    Subjective Data  Subjective: "You'll be lucky if I walk to the door" Patient Stated Goal: return home but agreeable to rehab first   Cognition  Cognition Arousal/Alertness: Awake/alert Behavior During Therapy: WFL for tasks assessed/performed Overall Cognitive Status: Within Functional Limits for tasks assessed    Balance     End of Session PT - End of Session Equipment Utilized During Treatment: Gait belt Activity Tolerance: Patient tolerated treatment well Patient left: in chair;with call bell/phone within reach;with family/visitor present Nurse Communication: Mobility status   GP     Quin Hoop [place], [name] [name]  07/17/2012, 1:12 PM

## 2012-07-17 NOTE — Progress Notes (Signed)
[facility] PROGRESS NOTE  [name] [name] [id number] DOB: 1940/03/15 DOA: 07/13/2012 PCP: [name], MD  Assessment/Plan: Fall with femoral intertrochanteric fracture: Patient is status post surgery. Reports doing well. We will continue to defer management to the orthopedic service   Atrial fibrillation: This appears be controlled at present. Patient currently on heparin drip for bridging. Patient has been started back on Coumadin as of 07/15/2012. INR is currently 1.3.  Diabetes: Patient is currently maintained on a sliding scale regimen with Lantus.  Blood sugars have been around 250 over the past 24 hours. We'll further optimize sugar control by increasing Lantus to 30 units and adding a pre-meal insulin. Will also continue her sliding scale regimen.   DVT prophylaxis: Patient is on therapeutic anticoagulation   Code Status: Full Family Communication: Patient in room (indicate person spoken with, relationship, and if by phone, the number) Disposition Plan: Pending   Consultants:  Orthopedic surgery  Cardiology  Procedures:  Intramedullary nail to the hip on 07/15/2012  Antibiotics:    HPI/Subjective: The patient is without complaints  Objective: Filed Vitals:   07/16/12 2115 07/16/12 2210 07/17/12 0045 07/17/12 0622  BP: 131/34 131/34 147/52 145/64  Pulse: 81 81 77 76  Temp: 99.1 F (37.3 C)   99.6 F (37.6 C)  TempSrc:      Resp: 20   18  Height:      Weight:      SpO2:    99%    Intake/Output Summary (Last 24 hours) at 07/17/12 0938 Last data filed at 07/17/12 [id number]  Gross per 24 hour  Intake  982.5 ml  Output      0 ml  Net  982.5 ml   Filed Weights   07/13/12 1842  Weight: 79.379 kg (175 lb)    Exam:   General:  Patient's awake alert in no apparent distress  Cardiovascular: Regular S1-S2  Respiratory: Normal respiratory effort, no crackles no wheezing  Abdomen: Positive bowel sounds, soft  Musculoskeletal: No clubbing or  cyanosis, but distally perfused   Data Reviewed: Basic Metabolic Panel:  Recent Labs Lab 07/13/12 1935 07/14/12 0130 07/15/12 1519 07/16/12 0510 07/17/12 0550  NA 137 133*  --  132* 133*  K 3.4* 3.8  --  4.3 4.2  CL 97 97  --  97 97  CO2 27 28  --  27 31  GLUCOSE 80 228*  --  182* 193*  BUN 15 15  --  14 12  CREATININE 0.93 0.88 0.79 1.05 0.92  CALCIUM 9.4 8.3*  --  8.5 8.7   Liver Function Tests: No results found for this basename: AST, ALT, ALKPHOS, BILITOT, PROT, ALBUMIN,  in the last 168 hours No results found for this basename: LIPASE, AMYLASE,  in the last 168 hours No results found for this basename: AMMONIA,  in the last 168 hours CBC:  Recent Labs Lab 07/13/12 1935 07/14/12 0130 07/15/12 1519 07/16/12 0510 07/17/12 0550  WBC 8.2 10.0 14.4* 9.0 9.8  NEUTROABS 5.6  --   --   --   --   HGB 11.8* 10.3* 10.1* 8.3* 8.6*  HCT 33.9* 30.6* 29.2* 23.7* 24.4*  MCV 89.4 89.5 90.1 88.8 87.8  PLT 255 219 187 186 172   Cardiac Enzymes: No results found for this basename: CKTOTAL, CKMB, CKMBINDEX, TROPONINI,  in the last 168 hours BNP (last 3 results) No results found for this basename: PROBNP,  in the last 8760 hours CBG:  Recent Labs Lab 07/16/12 1624  07/16/12 1954 07/17/12 0023 07/17/12 0350 07/17/12 0906  GLUCAP 262* 243* 224* 173* 278*    No results found for this or any previous visit (from the past 240 hour(s)).   Studies: Dg Femur Right  07/15/2012   *RADIOLOGY REPORT*  Clinical Data: Right intratrochanteric fracture.  DG C-ARM 1-60 MIN,RIGHT FEMUR - 2 VIEW  Fluoroscopic time:  1 minute and 39 seconds.  Comparison:  07/13/2012.  Findings: Five intraoperative C-arm views of the right femur submitted for review after surgery.  Right intratrochanteric fracture has been treated with long stem right femoral intramedullary rod with distal fixation and proximal sliding type screw.  Intratrochanteric fracture fragments remain slightly separated.  IMPRESSION:  Right intratrochanteric fracture has been treated with long stem right femoral intramedullary rod with distal fixation and proximal sliding type screw.  Intratrochanteric fracture fragments remain slightly separated   Original Report Authenticated By: Genia Del, M.D.   Dg Pelvis Portable  07/15/2012   *RADIOLOGY REPORT*  Clinical Data: Postop right hip fracture.  PORTABLE PELVIS,PORTABLE RIGHT HIP - 1 VIEW  Comparison: 07/13/2012 plainfilm and intraoperative exam 07/15/2012.  Findings: Right intertrochanteric fracture treated with long stem right femoral intramedullary rod with distal fixation and proximal dynamic type screw.  The distal aspect is not imaged on the current examination. Intratrochanteric fracture fragments remain slightly separated.  No complication noted.  IMPRESSION: Right intertrochanteric fracture treated with long stem right femoral intramedullary rod with distal fixation and proximal dynamic type screw.  The distal aspect is not imaged on the current examination.  Intratrochanteric fracture fragments remain slightly separated.  No complication noted.   Original Report Authenticated By: Genia Del, M.D.   Dg Hip Portable 1 View Right  07/15/2012   *RADIOLOGY REPORT*  Clinical Data: Postop right hip fracture.  PORTABLE PELVIS,PORTABLE RIGHT HIP - 1 VIEW  Comparison: 07/13/2012 plainfilm and intraoperative exam 07/15/2012.  Findings: Right intertrochanteric fracture treated with long stem right femoral intramedullary rod with distal fixation and proximal dynamic type screw.  The distal aspect is not imaged on the current examination. Intratrochanteric fracture fragments remain slightly separated.  No complication noted.  IMPRESSION: Right intertrochanteric fracture treated with long stem right femoral intramedullary rod with distal fixation and proximal dynamic type screw.  The distal aspect is not imaged on the current examination.  Intratrochanteric fracture fragments remain slightly  separated.  No complication noted.   Original Report Authenticated By: Genia Del, M.D.   Dg C-arm 1-60 Min  07/15/2012   *RADIOLOGY REPORT*  Clinical Data: Right intratrochanteric fracture.  DG C-ARM 1-60 MIN,RIGHT FEMUR - 2 VIEW  Fluoroscopic time:  1 minute and 39 seconds.  Comparison:  07/13/2012.  Findings: Five intraoperative C-arm views of the right femur submitted for review after surgery.  Right intratrochanteric fracture has been treated with long stem right femoral intramedullary rod with distal fixation and proximal sliding type screw.  Intratrochanteric fracture fragments remain slightly separated.  IMPRESSION: Right intratrochanteric fracture has been treated with long stem right femoral intramedullary rod with distal fixation and proximal sliding type screw.  Intratrochanteric fracture fragments remain slightly separated   Original Report Authenticated By: Genia Del, M.D.    Scheduled Meds: . aspirin EC  81 mg Oral Daily  . calcium-vitamin D  1 tablet Oral Daily  . diltiazem  240 mg Oral Daily  . docusate sodium  100 mg Oral BID  . famotidine  40 mg Oral QHS  . heparin subcutaneous  5,000 Units Subcutaneous Q8H  . insulin aspart  0-20 Units Subcutaneous Q4H  . insulin glargine  20 Units Subcutaneous QHS  . lubiprostone  24 mcg Oral BID WC  . metoprolol tartrate  12.5 mg Oral BID  . multivitamin with minerals  1 tablet Oral Daily  . pravastatin  40 mg Oral q1800  . senna  1 tablet Oral BID  . Warfarin - Pharmacist Dosing Inpatient   Does not apply q1800   Continuous Infusions: . 0.45 % NaCl with KCl 20 mEq / L 75 mL/hr (07/16/12 2216)    Principal Problem:   Intertrochanteric fracture of right hip Active Problems:   Atrial fibrillation   Seizure disorder   DM (diabetes mellitus)   Fall   Diabetes   Hypokalemia   Preop cardiovascular exam   Hyponatremia    Time spent: 20 minutes    Almee Pelphrey, [place] [facility] Pager [phone]. If 7PM-7AM, please  contact night-coverage at [email or web address], password [facility] 07/17/2012, 9:38 AM  LOS: 4 days

## 2012-07-17 NOTE — Progress Notes (Signed)
ANTICOAGULATION CONSULT NOTE - Follow Up Consult  Pharmacy Consult for : Coumadin with SQ Heparin bridging Indication:  atrial fibrillation and VTE prophylaxis  Labs:  Recent Labs  07/14/12 1634 07/15/12 1519 07/16/12 0510 07/17/12 0550  HGB  --  10.1* 8.3* 8.6*  HCT  --  29.2* 23.7* 24.4*  PLT  --  187 186 172  LABPROT  --   --  15.7* 16.0*  INR  --   --  1.28 1.31  HEPARINUNFRC 0.51  --   --   --   CREATININE  --  0.79 1.05 0.92   Lab Results  Component Value Date   INR 1.31 07/17/2012   INR 1.28 07/16/2012   INR 1.87* 07/14/2012   Medications:  Medication Sig  . warfarin (COUMADIN) 2.5 MG tablet Take 1.25-2.5 mg by mouth daily. 0.5 tab daily except for 1 tab on Fridays.   Scheduled:  . aspirin EC  81 mg Oral Daily  . calcium-vitamin D  1 tablet Oral Daily  . diltiazem  240 mg Oral Daily  . docusate sodium  100 mg Oral BID  . famotidine  40 mg Oral QHS  . heparin subcutaneous  5,000 Units Subcutaneous Q8H  . insulin aspart  0-20 Units Subcutaneous Q4H  . insulin aspart  5 Units Subcutaneous TID WC  . insulin glargine  30 Units Subcutaneous QHS  . lubiprostone  24 mcg Oral BID WC  . metoprolol tartrate  12.5 mg Oral BID  . multivitamin with minerals  1 tablet Oral Daily  . pravastatin  40 mg Oral q1800  . senna  1 tablet Oral BID  . Warfarin - Pharmacist Dosing Inpatient   Does not apply q1800    Assessment:  72 y/o female 2 Days Post-Op hip repair who has resumed Coumadin with prior history of atrial fibrillation   Patient on Heparin sq q 8 hours as bridge to therapeutic INR  INR 1.31, post-op Hgb stablized 8.6. No overt bleeding complications noted   Patient slow to respond to Coumadin restart at pre-op dosage range.  Goal of Therapy:  INR 2-3   Plan:  Coumadin 6 mg po today.  Daily INR's, CBC. Monitor for bleeding complications    Selena Batten.D 07/17/2012, 11:20 AM

## 2012-07-17 NOTE — Progress Notes (Signed)
[facility]opaedic [facility]uma [facility] Progress Note     2 Days Post-Op  Subjective   Doing well this am Tolerated transfusion well yesterday (1 unit PRBC) Worked well with therapy this am No significant complaints  No CP, no SOB No lightheadedness   Objective  BP 145/64  Pulse 76  Temp(Src) 99.6 F (37.6 C) (Oral)  Resp 18  Ht 5' 8.11" (1.73 m)  Wt 79.379 kg (175 lb)  BMI 26.52 kg/m2  SpO2 97%  Patient Vitals for the past 24 hrs:  BP Temp Temp src Pulse Resp SpO2  07/17/12 0800 - - - - 18 97 %  07/17/12 0622 145/64 mmHg 99.6 F (37.6 C) - 76 18 99 %  07/17/12 0045 147/52 mmHg - - 77 - -  07/16/12 2210 131/34 mmHg - - 81 - -  07/16/12 2115 131/34 mmHg 99.1 F (37.3 C) - 81 20 -  07/16/12 2015 131/41 mmHg 98.8 F (37.1 C) - 83 16 -  07/16/12 1915 137/45 mmHg 99 F (37.2 C) - 83 18 -  07/16/12 1830 121/41 mmHg 99 F (37.2 C) - 81 18 -  07/16/12 1815 128/45 mmHg 98.5 F (36.9 C) - 80 18 -  07/16/12 1406 155/41 mmHg 98.8 F (37.1 C) Oral 86 18 98 %    Intake/Output     05/17 0701 - 05/18 0700 05/18 0701 - 05/19 0700   P.O. 1040    I.V. (mL/kg) 250 (3.1)    Blood 12.5    IV Piggyback     Total Intake(mL/kg) 1302.5 (16.4)    Urine (mL/kg/hr)     Blood     Total Output       Net +1302.5          Urine Occurrence 2 x    Stool Occurrence 1 x      Labs Results for PASQUALINA, WINBORN (MRN [id number]) as of 07/17/2012 11:42  Ref. Range 07/17/2012 05:50  Sodium Latest Range: 135-145 mEq/L 133 (L)  Potassium Latest Range: 3.5-5.1 mEq/L 4.2  Chloride Latest Range: 96-112 mEq/L 97  CO2 Latest Range: 19-32 mEq/L 31  BUN Latest Range: 6-23 mg/dL 12  Creatinine Latest Range: 0.50-1.10 mg/dL 0.92  Calcium Latest Range: 8.4-10.5 mg/dL 8.7  GFR calc non Af Amer Latest Range: >90 mL/min 62 (L)  GFR calc Af Amer Latest Range: >90 mL/min 71 (L)  Glucose Latest Range: 70-99 mg/dL 193 (H)  WBC Latest Range: 4.0-10.5 K/uL 9.8  RBC Latest Range: 3.87-5.11 MIL/uL 2.78 (L)   Hemoglobin Latest Range: 12.0-15.0 g/dL 8.6 (L)  HCT Latest Range: 36.0-46.0 % 24.4 (L)  MCV Latest Range: 78.0-100.0 fL 87.8  MCH Latest Range: 26.0-34.0 pg 30.9  MCHC Latest Range: 30.0-36.0 g/dL 35.2  RDW Latest Range: 11.5-15.5 % 12.8  Platelets Latest Range: 150-400 K/uL 172  Prothrombin Time Latest Range: 11.6-15.2 seconds 16.0 (H)  INR Latest Range: 0.00-1.49  1.31    Exam   Gen:  Awake and alert, NAD, sitting in beside chair, finished lunch Ext:             Right Lower Extremity               Dressings c/d/i             Swelling controlled             DPN, SPN, TN sensation intact             Ext warm             +  DP pulse             No DCT             Compartments soft and nt             EHL FHL, AT, PT, peroneals, gastroc motor intact  Assessment and Plan  2 Days Post-Op 72 y/o female s/p fall  1. Fall 2. R reverse obliquity IT femur fx POD 2             WBAT               ROM as tolerated             PT/OT             Ice and elevate              3. ABL anemia:            slight rise in H/H  Continue to monitor  4. Pain management:             Minimize narcotics             Tylenol              5. DVT/PE prophylaxis:             Heparin bridge to coumadin             TED's             SCD's  6. FEN/Foley/Lines:             IVF             Advance diet as tolerated              Hyponatremia- monitor   7. Dispo:             PT/OT             ortho issues stable              Likely Monday     Jari Pigg, PA-C [facility] [phone] (P) 07/17/2012 11:42 AM

## 2012-07-18 ENCOUNTER — Encounter (HOSPITAL_COMMUNITY): Payer: Self-pay | Admitting: Orthopedic Surgery

## 2012-07-18 DIAGNOSIS — K5900 Constipation, unspecified: Secondary | ICD-10-CM

## 2012-07-18 LAB — CBC
HCT: 25 % — ABNORMAL LOW (ref 36.0–46.0)
Hemoglobin: 8.7 g/dL — ABNORMAL LOW (ref 12.0–15.0)
WBC: 8.4 10*3/uL (ref 4.0–10.5)

## 2012-07-18 LAB — PROTIME-INR: INR: 1.24 (ref 0.00–1.49)

## 2012-07-18 LAB — GLUCOSE, CAPILLARY
Glucose-Capillary: 226 mg/dL — ABNORMAL HIGH (ref 70–99)
Glucose-Capillary: 232 mg/dL — ABNORMAL HIGH (ref 70–99)
Glucose-Capillary: 165 mg/dL — ABNORMAL HIGH (ref 70–99)

## 2012-07-18 MED ORDER — MAGNESIUM CITRATE PO SOLN
1.0000 | Freq: Once | ORAL | Status: AC
  Administered 2012-07-18: 1 via ORAL
  Filled 2012-07-18: qty 296

## 2012-07-18 MED ORDER — WARFARIN SODIUM 4 MG PO TABS
4.0000 mg | ORAL_TABLET | Freq: Once | ORAL | Status: AC
  Administered 2012-07-18: 4 mg via ORAL
  Filled 2012-07-18 (×2): qty 1

## 2012-07-18 NOTE — Progress Notes (Signed)
Inpatient Diabetes Program Recommendations  AACE/ADA: New Consensus Statement on Inpatient Glycemic Control (2013)  Target Ranges:  Prepandial:   less than 140 mg/dL      Peak postprandial:   less than 180 mg/dL (1-2 hours)      Critically ill patients:  140 - 180 mg/dL    Inpatient Diabetes Program Recommendations Insulin - Basal: Consider decreasing Lantus to 20 units   Note: patient refused Lantus dose last night.   Thank you  Raoul Pitch BSN, RN,CDE Inpatient Diabetes Coordinator [phone] (team pager)

## 2012-07-18 NOTE — Progress Notes (Addendum)
Patient ID: [name] [name], female   DOB: 05-01-1940, 72 y.o.   MRN: [id number]     Subjective:  Patient reports pain as mild.  Doing okay this morning PT went well yesterday.  Right shoulder hurting off and on, today feeling better.  Objective:   VITALS:   Filed Vitals:   07/17/12 1603 07/17/12 2024 07/18/12 0556 07/18/12 0800  BP: 148/49 137/48 160/56   Pulse: 76 73 78   Temp: 98.9 F (37.2 C) 99.2 F (37.3 C) 98.6 F (37 C)   TempSrc: Oral     Resp: 18 18 18 16   Height:      Weight:      SpO2: 96% 95% 96% 96%    ABD soft Sensation intact distally Dorsiflexion/Plantar flexion intact Incision: dressing C/D/I and no drainage Wound clean and dry Right shoulder some bruising, good ROM,  Non tender to palpation. 0-165 forward flex and abduction Not bothering her today   Lab Results  Component Value Date   WBC 8.4 07/18/2012   HGB 8.7* 07/18/2012   HCT 25.0* 07/18/2012   MCV 88.0 07/18/2012   PLT 209 07/18/2012     Assessment/Plan: 3 Days Post-Op   Principal Problem:   Intertrochanteric fracture of right hip Active Problems:   Atrial fibrillation   Seizure disorder   DM (diabetes mellitus)   Fall   Diabetes   Hypokalemia   Preop cardiovascular exam   Hyponatremia shoulder pain:  Ok for WBAT, I don't see evidence for catastrophic structural pathology.  May consider injection down the line if pain persists, but currently tolerable.    Advance diet Up with therapy Okay for SNF placement WBAT right lower ext Dry dressing change PRN Follow up in 2 weeks   Keaundra Stehle P 07/18/2012, 10:06 AM   Marchia Bond, MD Cell [phone]

## 2012-07-18 NOTE — Progress Notes (Signed)
ANTICOAGULATION CONSULT NOTE - Follow Up Consult  Pharmacy Consult for Coumadin Indication: atrial fibrillation and VTE prophylaxis s/p hip surgery  Allergies  Allergen Reactions  . Procaine Hcl Anaphylaxis    Patient Measurements: Height: 5' 8.11" (173 cm) Weight: 175 lb (79.379 kg) IBW/kg (Calculated) : 64.15  Vital Signs: Temp: 99 F (37.2 C) (05/19 1300) BP: 148/48 mmHg (05/19 1300) Pulse Rate: 68 (05/19 1300)  Labs:  Recent Labs  07/15/12 1519 07/16/12 0510 07/17/12 0550 07/18/12 0625  HGB 10.1* 8.3* 8.6* 8.7*  HCT 29.2* 23.7* 24.4* 25.0*  PLT 187 186 172 209  LABPROT  --  15.7* 16.0* 15.4*  INR  --  1.28 1.31 1.24  CREATININE 0.79 1.05 0.92  --     Estimated Creatinine Clearance: 63.1 ml/min (by C-G formula based on Cr of 0.92).  Assessment:  POD # 3 right hip repair after fall at home.  On Coumadin prior to admission for atrial fibrillation.  Home Coumadin regimen:  1.25 mg daily, except 2.5 mg on Fridays. INR on admission = 1.78    Vitamin K 2.5 mg PO given on 5/15 ~1am.    Coumadin resumed post-op 5/16, but INR not rising.    Has had Coumadin 2.5 mg, 4 mg, then 6 mg over the last 3 days.  Much greater than home dose. Also on Heparin 5000 units SQ q8hrs.  Goal of Therapy:  INR 2-3 Monitor platelets by anticoagulation protocol: Yes   Plan:   Coumadin 4 mg today.  Continue Heparin 5000 units SQ q8hrs.  Daily PT/INR.  Arty Baumgartner, [place] Page: [phone] 07/18/2012,3:06 PM

## 2012-07-18 NOTE — Progress Notes (Signed)
PT/OT Cancellation Note  Patient Details Name: [name] [name] MRN: [id number] DOB: 1941-02-04   Cancelled Treatment:       Pt adamantly declining mobility and ADL training, despite education to benefits of therapy  She promises to participate tomorrow  Thanks,    Roney Marion [facility] 07/18/2012, 2:32 PM

## 2012-07-18 NOTE — Progress Notes (Signed)
[facility] PROGRESS NOTE  [name] [name] [id number] DOB: 02-20-1941 DOA: 07/13/2012 PCP: [name], MD  Assessment/Plan: Fall with femoral intertrochanteric fracture: Patient is status post surgery. Reports doing well. We will continue to defer management to the orthopedic service   Atrial fibrillation: This appears be controlled at present. Patient has been started back on Coumadin as of 07/15/2012. INR is currently 1.2. we'll continue Coumadin and continue monitoring closely. There no signs of bleeding.  Diabetes: Patient is currently maintained on a sliding scale regimen with Lantus.  Blood sugars have been around 200 over the past 24 hours. We'll increase Lantus to 40 units and continue her pre-meal insulin. Will also continue her sliding scale regimen.   DVT prophylaxis: Patient is on therapeutic anticoagulation  Constipation: Magnesium citrate ordered. Will cont to promote bowel motility. Had tried Amitiza yesterday. Will continue.   Code Status: Full Family Communication: Patient in room (indicate person spoken with, relationship, and if by phone, the number) Disposition Plan: Pending   Consultants:  Orthopedic surgery  Cardiology  Procedures:  Intramedullary nail to the hip on 07/15/2012  Antibiotics:   (indicate start date, and stop date if known)  HPI/Subjective: Patient reports no bowel movement in the past week  Objective: Filed Vitals:   07/17/12 1603 07/17/12 2024 07/18/12 0556 07/18/12 0800  BP: 148/49 137/48 160/56   Pulse: 76 73 78   Temp: 98.9 F (37.2 C) 99.2 F (37.3 C) 98.6 F (37 C)   TempSrc: Oral     Resp: 18 18 18 16   Height:      Weight:      SpO2: 96% 95% 96% 96%    Intake/Output Summary (Last 24 hours) at 07/18/12 1033 Last data filed at 07/18/12 0557  Gross per 24 hour  Intake    960 ml  Output      0 ml  Net    960 ml   Filed Weights   07/13/12 1842  Weight: 79.379 kg (175 lb)    Exam:   General:   Patient awake in no apparent stress  Cardiovascular: Regular S1-S2  Respiratory: Almost prefer, crackles no wheezing  Abdomen: Soft positive bowel sounds  Musculoskeletal: Perfused distally, no clubbing or cyanosis   Data Reviewed: Basic Metabolic Panel:  Recent Labs Lab 07/13/12 1935 07/14/12 0130 07/15/12 1519 07/16/12 0510 07/17/12 0550  NA 137 133*  --  132* 133*  K 3.4* 3.8  --  4.3 4.2  CL 97 97  --  97 97  CO2 27 28  --  27 31  GLUCOSE 80 228*  --  182* 193*  BUN 15 15  --  14 12  CREATININE 0.93 0.88 0.79 1.05 0.92  CALCIUM 9.4 8.3*  --  8.5 8.7   Liver Function Tests: No results found for this basename: AST, ALT, ALKPHOS, BILITOT, PROT, ALBUMIN,  in the last 168 hours No results found for this basename: LIPASE, AMYLASE,  in the last 168 hours No results found for this basename: AMMONIA,  in the last 168 hours CBC:  Recent Labs Lab 07/13/12 1935 07/14/12 0130 07/15/12 1519 07/16/12 0510 07/17/12 0550 07/18/12 0625  WBC 8.2 10.0 14.4* 9.0 9.8 8.4  NEUTROABS 5.6  --   --   --   --   --   HGB 11.8* 10.3* 10.1* 8.3* 8.6* 8.7*  HCT 33.9* 30.6* 29.2* 23.7* 24.4* 25.0*  MCV 89.4 89.5 90.1 88.8 87.8 88.0  PLT 255 219 187 186 172 209  Cardiac Enzymes: No results found for this basename: CKTOTAL, CKMB, CKMBINDEX, TROPONINI,  in the last 168 hours BNP (last 3 results) No results found for this basename: PROBNP,  in the last 8760 hours CBG:  Recent Labs Lab 07/17/12 0906 07/17/12 1121 07/17/12 1605 07/17/12 2026 07/18/12 0706  GLUCAP 278* 354* 186* 188* 226*    No results found for this or any previous visit (from the past 240 hour(s)).   Studies: No results found.  Scheduled Meds: . aspirin EC  81 mg Oral Daily  . calcium-vitamin D  1 tablet Oral Daily  . diltiazem  240 mg Oral Daily  . docusate sodium  100 mg Oral BID  . famotidine  40 mg Oral QHS  . heparin subcutaneous  5,000 Units Subcutaneous Q8H  . insulin aspart  0-20 Units  Subcutaneous TID WC  . insulin aspart  0-5 Units Subcutaneous QHS  . insulin aspart  5 Units Subcutaneous TID WC  . insulin glargine  30 Units Subcutaneous QHS  . lubiprostone  24 mcg Oral BID WC  . magnesium citrate  1 Bottle Oral Once  . metoprolol tartrate  12.5 mg Oral BID  . multivitamin with minerals  1 tablet Oral Daily  . pravastatin  40 mg Oral q1800  . senna  1 tablet Oral BID  . Warfarin - Pharmacist Dosing Inpatient   Does not apply q1800   Continuous Infusions: . 0.45 % NaCl with KCl 20 mEq / L 75 mL/hr (07/16/12 2216)    Principal Problem:   Intertrochanteric fracture of right hip Active Problems:   Atrial fibrillation   Seizure disorder   DM (diabetes mellitus)   Fall   Diabetes   Hypokalemia   Preop cardiovascular exam   Hyponatremia    Time spent: 25 minutes    [facility], [place] [facility] Pager [phone]. If 7PM-7AM, please contact night-coverage at [email or web address], password [employer] 07/18/2012, 10:33 AM  LOS: 5 days

## 2012-07-19 LAB — GLUCOSE, CAPILLARY: Glucose-Capillary: 177 mg/dL — ABNORMAL HIGH (ref 70–99)

## 2012-07-19 MED ORDER — WARFARIN SODIUM 4 MG PO TABS
4.0000 mg | ORAL_TABLET | Freq: Once | ORAL | Status: AC
  Administered 2012-07-19: 4 mg via ORAL
  Filled 2012-07-19: qty 1

## 2012-07-19 MED ORDER — PHENYTOIN SODIUM EXTENDED 100 MG PO CAPS
100.0000 mg | ORAL_CAPSULE | Freq: Three times a day (TID) | ORAL | Status: DC
  Administered 2012-07-19 – 2012-07-20 (×3): 100 mg via ORAL
  Filled 2012-07-19 (×5): qty 1

## 2012-07-19 NOTE — Progress Notes (Signed)
[facility] PROGRESS NOTE  [name] [name] [id number] DOB: 06/24/1940 DOA: 07/13/2012 PCP: [name] November, MD  Assessment/Plan: Fall with femoral intertrochanteric fracture: Patient is status post surgery. Reports doing well. We will continue to defer management to the orthopedic service    Atrial fibrillation: This appears be controlled at present. Patient has been started back on Coumadin as of 07/15/2012. INR is currently 1.4. we'll continue Coumadin and continue monitoring closely. There no signs of bleeding.    Diabetes: Patient is currently maintained on a sliding scale regimen with Lantus at 40 units. Blood sugars have improved. We'll continue to titrate as needed.   DVT prophylaxis: Patient is on therapeutic anticoagulation    Constipation: Positive bowel movement with magnesium  citrate.   Code Status: Full Family Communication: Patient in room (indicate person spoken with, relationship, and if by phone, the number) Disposition Plan: Pending   Consultants:  Orthopedic surgery  Procedures: Intramedullary nail to the hip on 07/15/2012  Antibiotics:    HPI/Subjective: Patient is without complaints  Objective: Filed Vitals:   07/18/12 0800 07/18/12 1300 07/18/12 2124 07/19/12 0617  BP:  148/48 154/44 152/52  Pulse:  68 84 70  Temp:  99 F (37.2 C) 98.3 F (36.8 C) 98.6 F (37 C)  TempSrc:   Oral Oral  Resp: 16 18 18 18   Height:      Weight:      SpO2: 96% 94% 95% 95%    Intake/Output Summary (Last 24 hours) at 07/19/12 1052 Last data filed at 07/18/12 1700  Gross per 24 hour  Intake      0 ml  Output      5 ml  Net     -5 ml   Filed Weights   07/13/12 1842  Weight: 79.379 kg (175 lb)    Exam:   General:  Patient awake in no apparent distress  Cardiovascular: Regular S1-S2  Respiratory: Normal respiratory effort, no crackles no wheezing  Abdomen: Soft, nontender  Musculoskeletal: Perfused, no clubbing or cyanosis   Data  Reviewed: Basic Metabolic Panel:  Recent Labs Lab 07/13/12 1935 07/14/12 0130 07/15/12 1519 07/16/12 0510 07/17/12 0550  NA 137 133*  --  132* 133*  K 3.4* 3.8  --  4.3 4.2  CL 97 97  --  97 97  CO2 27 28  --  27 31  GLUCOSE 80 228*  --  182* 193*  BUN 15 15  --  14 12  CREATININE 0.93 0.88 0.79 1.05 0.92  CALCIUM 9.4 8.3*  --  8.5 8.7   Liver Function Tests: No results found for this basename: AST, ALT, ALKPHOS, BILITOT, PROT, ALBUMIN,  in the last 168 hours No results found for this basename: LIPASE, AMYLASE,  in the last 168 hours No results found for this basename: AMMONIA,  in the last 168 hours CBC:  Recent Labs Lab 07/13/12 1935 07/14/12 0130 07/15/12 1519 07/16/12 0510 07/17/12 0550 07/18/12 0625  WBC 8.2 10.0 14.4* 9.0 9.8 8.4  NEUTROABS 5.6  --   --   --   --   --   HGB 11.8* 10.3* 10.1* 8.3* 8.6* 8.7*  HCT 33.9* 30.6* 29.2* 23.7* 24.4* 25.0*  MCV 89.4 89.5 90.1 88.8 87.8 88.0  PLT 255 219 187 186 172 209   Cardiac Enzymes: No results found for this basename: CKTOTAL, CKMB, CKMBINDEX, TROPONINI,  in the last 168 hours BNP (last 3 results) No results found for this basename: PROBNP,  in the last 8760  hours CBG:  Recent Labs Lab 07/18/12 0706 07/18/12 1112 07/18/12 1604 07/18/12 2158 07/19/12 0637  GLUCAP 226* 232* 165* 164* 177*    No results found for this or any previous visit (from the past 240 hour(s)).   Studies: No results found.  Scheduled Meds: . aspirin EC  81 mg Oral Daily  . calcium-vitamin D  1 tablet Oral Daily  . diltiazem  240 mg Oral Daily  . docusate sodium  100 mg Oral BID  . famotidine  40 mg Oral QHS  . heparin subcutaneous  5,000 Units Subcutaneous Q8H  . insulin aspart  0-20 Units Subcutaneous TID WC  . insulin aspart  0-5 Units Subcutaneous QHS  . insulin aspart  5 Units Subcutaneous TID WC  . insulin glargine  30 Units Subcutaneous QHS  . lubiprostone  24 mcg Oral BID WC  . metoprolol tartrate  12.5 mg Oral BID   . multivitamin with minerals  1 tablet Oral Daily  . pravastatin  40 mg Oral q1800  . senna  1 tablet Oral BID  . Warfarin - Pharmacist Dosing Inpatient   Does not apply q1800   Continuous Infusions: . 0.45 % NaCl with KCl 20 mEq / L 75 mL/hr (07/16/12 2216)    Principal Problem:   Intertrochanteric fracture of right hip Active Problems:   Atrial fibrillation   Seizure disorder   DM (diabetes mellitus)   Fall   Diabetes   Hypokalemia   Preop cardiovascular exam   Hyponatremia    Time spent: 20 minutes    [name] [name], [name] Pager [phone]. If 7PM-7AM, please contact night-coverage at [email or web address], password [facility] 07/19/2012, 10:52 AM  LOS: 6 days

## 2012-07-19 NOTE — Progress Notes (Signed)
ANTICOAGULATION CONSULT NOTE - Follow Up Consult  Pharmacy Consult for Coumadin Indication: atrial fibrillation and VTE prophylaxis s/p hip surgery  Allergies  Allergen Reactions  . Procaine Hcl Anaphylaxis    Patient Measurements: Height: 5' 8.11" (173 cm) Weight: 175 lb (79.379 kg) IBW/kg (Calculated) : 64.15  Vital Signs: Temp: 98.6 F (37 C) (05/20 0617) Temp src: Oral (05/20 0617) BP: 152/52 mmHg (05/20 0617) Pulse Rate: 70 (05/20 0617)  Labs:  Recent Labs  07/17/12 0550 07/18/12 0625 07/19/12 0437  HGB 8.6* 8.7*  --   HCT 24.4* 25.0*  --   PLT 172 209  --   LABPROT 16.0* 15.4* 17.5*  INR 1.31 1.24 1.48  CREATININE 0.92  --   --     Estimated Creatinine Clearance: 63.1 ml/min (by C-G formula based on Cr of 0.92).  Assessment:  POD # 4 right hip repair after fall at home.  On Coumadin prior to admission for atrial fibrillation.  Home Coumadin regimen:  1.25 mg daily, except 2.5 mg on Fridays. INR on admission = 1.78    Vitamin K 2.5 mg PO given on 5/15 ~1am.    Coumadin resumed post-op 5/16, but INR not rising.    Has had Coumadin 2.5 mg, 4 mg, 6 mg, then 4 mg over the last 4 days. Much greater than home dose. INR now increasing.  Also on Heparin 5000 units SQ q8hrs.  Goal of Therapy:  INR 2-3 Monitor platelets by anticoagulation protocol: Yes   Plan:   Coumadin 4 mg again today.  Continue Heparin 5000 units SQ q8hrs until INR >2.  Daily PT/INR.  Arty Baumgartner, [place] Page: [phone] 07/19/2012,12:27 PM

## 2012-07-19 NOTE — Progress Notes (Signed)
OT Cancellation Note  Patient Details Name: [name] [name] MRN: [id number] DOB: 1940-10-11   Cancelled Treatment:      Pt refusing to work with OT/PT this am. Will re-attempt tomorrow.   Benito Mccreedy OTR/L [id number]  07/19/2012, 12:15 PM

## 2012-07-19 NOTE — Progress Notes (Signed)
Patient ID: [name] [name], female   DOB: 26-May-1940, 72 y.o.   MRN: [id number]     Subjective:  Patient reports pain as mild.  Patient doing well.  She reports that she was able to rest better last night  Objective:   VITALS:   Filed Vitals:   07/18/12 0800 07/18/12 1300 07/18/12 2124 07/19/12 0617  BP:  148/48 154/44 152/52  Pulse:  68 84 70  Temp:  99 F (37.2 C) 98.3 F (36.8 C) 98.6 F (37 C)  TempSrc:   Oral Oral  Resp: 16 18 18 18   Height:      Weight:      SpO2: 96% 94% 95% 95%    ABD soft Sensation intact distally Dorsiflexion/Plantar flexion intact Incision: dressing C/D/I and no drainage   Lab Results  Component Value Date   WBC 8.4 07/18/2012   HGB 8.7* 07/18/2012   HCT 25.0* 07/18/2012   MCV 88.0 07/18/2012   PLT 209 07/18/2012     Assessment/Plan: 4 Days Post-Op   Principal Problem:   Intertrochanteric fracture of right hip Active Problems:   Atrial fibrillation   Seizure disorder   DM (diabetes mellitus)   Fall   Diabetes   Hypokalemia   Preop cardiovascular exam   Hyponatremia  Mild ABLA, observed, asymptomatic  Advance diet Up with therapy Patient okay for SNF when cleared by medicine  Continue plan per medicine WBAT Dry dressing PRN    Remonia Richter 07/19/2012, 7:49 AM   Marchia Bond, MD Cell [phone]

## 2012-07-19 NOTE — Progress Notes (Signed)
PT Cancellation Note  Patient Details Name: [name] [name] MRN: [id number] DOB: [birth year]   Cancelled Treatment:    Reason Eval/Treat Not Completed:  (Pt refused PT today.  )  Pt stated "I'm to frustrated and angry right now to walk or work with you. " Pt just got off the phone angry.  Attempted x 2 to see pt and pt unwilling to participate with PT.  Will attempt to see tomorrow.   Evangelina Delancey 07/19/2012, 2:54 PM Antoine Poche, [place] DPT [phone]

## 2012-07-20 ENCOUNTER — Ambulatory Visit: Payer: Medicare Other | Admitting: Endocrinology

## 2012-07-20 DIAGNOSIS — K219 Gastro-esophageal reflux disease without esophagitis: Secondary | ICD-10-CM

## 2012-07-20 LAB — GLUCOSE, CAPILLARY
Glucose-Capillary: 176 mg/dL — ABNORMAL HIGH (ref 70–99)
Glucose-Capillary: 158 mg/dL — ABNORMAL HIGH (ref 70–99)

## 2012-07-20 LAB — PROTIME-INR: Prothrombin Time: 17.4 seconds — ABNORMAL HIGH (ref 11.6–15.2)

## 2012-07-20 MED ORDER — ENOXAPARIN SODIUM 40 MG/0.4ML ~~LOC~~ SOLN
40.0000 mg | SUBCUTANEOUS | Status: DC
  Filled 2012-07-20: qty 0.4

## 2012-07-20 MED ORDER — WARFARIN SODIUM 6 MG PO TABS
6.0000 mg | ORAL_TABLET | Freq: Once | ORAL | Status: AC
  Administered 2012-07-20: 6 mg via ORAL
  Filled 2012-07-20: qty 1

## 2012-07-20 MED ORDER — WARFARIN SODIUM 2.5 MG PO TABS: 2.5000 mg | ORAL_TABLET | Freq: Every day | ORAL | Status: DC

## 2012-07-20 MED ORDER — ENOXAPARIN SODIUM 40 MG/0.4ML ~~LOC~~ SOLN: 40.0000 mg | SUBCUTANEOUS | Status: DC

## 2012-07-20 MED ORDER — ENOXAPARIN SODIUM 30 MG/0.3ML ~~LOC~~ SOLN
30.0000 mg | Freq: Two times a day (BID) | SUBCUTANEOUS | Status: DC
  Filled 2012-07-20 (×2): qty 0.3

## 2012-07-20 NOTE — Discharge Summary (Signed)
Physician Discharge Summary  Patient ID: [name] [name] MRN: [id number] DOB/AGE: 71-Aug-1942 71 y.o.  Admit date: 07/13/2012 Discharge date: 07/20/2012  Primary Care Physician:  [name], MD  Discharge Diagnoses:    Intertrochanteric fracture of right hip  Atrial fibrillation  Seizure disorder  DM (diabetes mellitus)  Fall  Diabetes  Hypokalemia  Preop cardiovascular exam  Hyponatremia   Discharge Condition: Improved  Consults: Orthopedics                    Cardiology   Recommendations for Outpatient Follow-up:  Followup with Dr. Larose Kells in 1-2 weeks  Follow up with Dr. Marchia Bond is scheduled Check PT/INR on Friday 07/22/12, DC lovenox if INR above2   Allergies:   Allergies  Allergen Reactions  . Procaine Hcl Anaphylaxis     Discharge Medications:   Medication List    TAKE these medications       albuterol 108 (90 BASE) MCG/ACT inhaler  Commonly known as:  VENTOLIN HFA  Inhale 2 puffs into the lungs every 6 (six) hours as needed for wheezing.     aspirin EC 81 MG tablet  Take 81 mg by mouth daily.     bisacodyl 5 MG EC tablet  Commonly known as:  DULCOLAX  Take 1 tablet (5 mg total) by mouth daily as needed.     calcium-vitamin D 500-200 MG-UNIT per tablet  Commonly known as:  OSCAL WITH D  Take 1 tablet by mouth daily.     diltiazem 240 MG 24 hr capsule  Commonly known as:  CARDIZEM CD  Take 240 mg by mouth daily.     DSS 100 MG Caps  Take 100 mg by mouth 2 (two) times daily.     enoxaparin 40 MG/0.4ML injection  Commonly known as:  LOVENOX  Inject 0.4 mLs (40 mg total) into the skin daily. Until INR above 2, then stop     HYDROcodone-acetaminophen 5-325 MG per tablet  Commonly known as:  NORCO  Take 1-2 tablets by mouth every 6 (six) hours as needed for pain. MAXIMUM TOTAL ACETAMINOPHEN DOSE IS 4000 MG PER DAY     insulin NPH-regular (70-30) 100 UNIT/ML injection  Commonly known as:  HUMULIN 70/30  Inject 35 Units into the skin daily  with breakfast.     metoprolol tartrate 25 MG tablet  Commonly known as:  LOPRESSOR  Take 0.5 tablets (12.5 mg total) by mouth 2 (two) times daily.     multivitamin with minerals Tabs  Take 1 tablet by mouth daily.     phenytoin 100 MG ER capsule  Commonly known as:  DILANTIN  Take 100 mg by mouth 3 (three) times daily. Taken with 50mg  tablet per dose to equal 150mg .     phenytoin 50 MG tablet  Commonly known as:  DILANTIN  Chew 50 mg by mouth 3 (three) times daily. Taken with 100mg  capsule per dose to equal 150mg .     pravastatin 40 MG tablet  Commonly known as:  PRAVACHOL  Take 1 tablet (40 mg total) by mouth daily.     ranitidine 300 MG tablet  Commonly known as:  ZANTAC  Take 1 tablet (300 mg total) by mouth at bedtime.     sennosides-docusate sodium 8.6-50 MG tablet  Commonly known as:  SENOKOT-S  Take 2 tablets by mouth daily.     warfarin 2.5 MG tablet  Commonly known as:  COUMADIN  Take 1 tablet (2.5 mg total) by mouth daily.  Brief H and P: For complete details please refer to admission H and P, but in brief Hx obtained from family and ER records. Patient was outside doing yard work earlier today when she lost her balance and fell. She hit her shoulder and hip. C/o pain in her hip. She did not have a seizure, did not hit her head, did not lose consciousness.  In the Er, she was found to have a right femur intertrochanteric fracture. Ortho consulted and plans for surgery in AM. No fracture of shoulder and CT scan head/neckshowed no acute issues   Hospital Course:   Upon admission to the inpatient service, the orthopedic surgery was consulted. The patient ultimately underwent intramedullary nail to the hip on 07/15/2012. Patient reportedly tolerated the procedure for her well. There were no complications noted.   Of note, patient is on Coumadin for her history of her fibrillation. This was felt and the patient was continued on heparin bridge.  Cardiovascularly, patient remained stable during this hospital course. At discharge, patient is placed on DVT prophylaxis with lovenox. Please DC lovenox when INR is above.  Regarding the patient's diabetes, she was continued on Lantus as well as a sliding scale insulin for her sugars while in the hospital. Her sugars have otherwise been stable.   Regarding history of seizures, the patient was found to have a mildly elevated Dilantin level on admission. This was held for one day and continue to the remainder of hospital course.   Procedures:  Intramedullary nail to the hip on 07/15/2012  Day of Discharge BP 115/65  Pulse 70  Temp(Src) 97.1 F (36.2 C) (Oral)  Resp 18  Ht 5' 8.11" (1.73 m)  Wt 79.379 kg (175 lb)  BMI 26.52 kg/m2  SpO2 99%  Physical Exam: General: Alert and awake, not in any acute distress. CVS: S1-S2 clear no murmur rubs or gallops Chest: clear to auscultation bilaterally, no wheezing rales or rhonchi Abdomen: soft nontender, nondistended, normal bowel sounds Extremities: no cyanosis, clubbing or edema noted bilaterally    The results of significant diagnostics from this hospitalization (including imaging, microbiology, ancillary and laboratory) are listed below for reference.    LAB RESULTS: Basic Metabolic Panel:  Recent Labs Lab 07/16/12 0510 07/17/12 0550  NA 132* 133*  K 4.3 4.2  CL 97 97  CO2 27 31  GLUCOSE 182* 193*  BUN 14 12  CREATININE 1.05 0.92  CALCIUM 8.5 8.7   Liver Function Tests: No results found for this basename: AST, ALT, ALKPHOS, BILITOT, PROT, ALBUMIN,  in the last 168 hours No results found for this basename: LIPASE, AMYLASE,  in the last 168 hours No results found for this basename: AMMONIA,  in the last 168 hours CBC:  Recent Labs Lab 07/13/12 1935  07/17/12 0550 07/18/12 0625  WBC 8.2  < > 9.8 8.4  NEUTROABS 5.6  --   --   --   HGB 11.8*  < > 8.6* 8.7*  HCT 33.9*  < > 24.4* 25.0*  MCV 89.4  < > 87.8 88.0  PLT 255   < > 172 209  < > = values in this interval not displayed. Cardiac Enzymes: No results found for this basename: CKTOTAL, CKMB, CKMBINDEX, TROPONINI,  in the last 168 hours BNP: No components found with this basename: POCBNP,  CBG:  Recent Labs Lab 07/20/12 0637 07/20/12 1112  GLUCAP 176* 158*    Significant Diagnostic Studies:  Dg Chest 2 View  07/13/2012   *RADIOLOGY REPORT*  Clinical Data: Fall, right rib pain.  CHEST - 2 VIEW  Comparison: 03/03/2012  Findings: Old healed right sixth rib fracture.  No acute rib fracture.  Heart is enlarged.  No confluent airspace opacities in the lungs.  Mild interstitial prominence.  The lateral view is suboptimal as the patient's arm is down by her side.  No visible effusions.  IMPRESSION: No acute findings.  No visible acute rib fracture.  Old healed right sixth rib fracture.   Original Report Authenticated By: Rolm Baptise, M.D.   Dg Shoulder Right  07/13/2012   *RADIOLOGY REPORT*  Clinical Data: Fall, right shoulder pain.  RIGHT SHOULDER - 2+ VIEW  Comparison: 08/12/2011  Findings: Degenerative changes in the right [facility] and glenohumeral joint.  Loss of some acromial space compatible with rotator cuff disease.  No fracture, subluxation or dislocation.  Old healed right sixth rib fracture.  IMPRESSION: No acute bony abnormality.  Degenerative changes.   Original Report Authenticated By: Rolm Baptise, M.D.   Dg Hip Complete Right  07/13/2012   *RADIOLOGY REPORT*  Clinical Data: Fall and hip pain.  RIGHT HIP - COMPLETE 2+ VIEW  Comparison: None.  Findings: There is a mildly displaced fracture involving the proximal right femur.  This appears to represent an intertrochanteric fracture.  The right hip is located. Pelvic bony ring is intact.  IMPRESSION: Mildly displaced right femoral intertrochanteric fracture.   Original Report Authenticated By: Markus Daft, M.D.   Ct Head Wo Contrast  07/13/2012   *RADIOLOGY REPORT*  Clinical Data:  Fall.  Injury to head and  neck.  Fracture tip.  CT HEAD WITHOUT CONTRAST CT CERVICAL SPINE WITHOUT CONTRAST  Technique:  Multidetector CT imaging of the head and cervical spine was performed following the standard protocol without intravenous contrast.  Multiplanar CT image reconstructions of the cervical spine were also generated.  Comparison:  11/05/2011  CT HEAD  Findings: Old area of encephalomalacia in the left temporal lobe. No acute infarction.  No hemorrhage or hydrocephalus.  No mass effect midline shift.  No extra-axial fluid collection.  No acute calvarial abnormality. Visualized paranasal sinuses and mastoids clear.  Orbital soft tissues unremarkable.  IMPRESSION: Stable left temporal encephalomalacia. No acute intracranial abnormality.  CT CERVICAL SPINE  Findings: Degenerative disc and facet disease throughout the cervical spine, most pronounced in the mid and lower cervical spine.  Fusion across the C6-7 disc space.  Prevertebral soft tissues are normal.  No fracture.  No epidural or paraspinal hematoma.  IMPRESSION: Degenerative changes.  Fusion at C6-7.  No acute findings.   Original Report Authenticated By: Rolm Baptise, M.D.   Ct Cervical Spine Wo Contrast  07/13/2012   *RADIOLOGY REPORT*  Clinical Data:  Fall.  Injury to head and neck.  Fracture tip.  CT HEAD WITHOUT CONTRAST CT CERVICAL SPINE WITHOUT CONTRAST  Technique:  Multidetector CT imaging of the head and cervical spine was performed following the standard protocol without intravenous contrast.  Multiplanar CT image reconstructions of the cervical spine were also generated.  Comparison:  11/05/2011  CT HEAD  Findings: Old area of encephalomalacia in the left temporal lobe. No acute infarction.  No hemorrhage or hydrocephalus.  No mass effect midline shift.  No extra-axial fluid collection.  No acute calvarial abnormality. Visualized paranasal sinuses and mastoids clear.  Orbital soft tissues unremarkable.  IMPRESSION: Stable left temporal encephalomalacia. No  acute intracranial abnormality.  CT CERVICAL SPINE  Findings: Degenerative disc and facet disease throughout the cervical spine, most pronounced in the  mid and lower cervical spine.  Fusion across the C6-7 disc space.  Prevertebral soft tissues are normal.  No fracture.  No epidural or paraspinal hematoma.  IMPRESSION: Degenerative changes.  Fusion at C6-7.  No acute findings.   Original Report Authenticated By: Rolm Baptise, M.D.    Disposition and Follow-up:     Discharge Orders   Future Appointments Provider Department Dept Phone   08/01/2012 1:15 PM [facility] [id number]   08/16/2012 9:30 AM Colon Branch, MD [place] [facility]  [place]   09/08/2012 2:00 PM Dennie Bible, NP [facility] [phone]   Future Orders Complete By Expires     Diet Carb Modified  As directed     Discharge instructions  As directed     Comments:      Please check PT/INR on Friday, 07/22/12. If INR is above 2, please discontinue Lovenox. Adjust coumadin dose according to PT/INR.    Increase activity slowly  As directed     Weight bearing as tolerated  As directed         DISPOSITION: SNF  DIET: carb modified diet  ACTIVITY: as tolerated  TESTS THAT NEED FOLLOW-UP PT/INR on 07/22/12  DISCHARGE FOLLOW-UP Follow-up Information   Follow up with Johnny Bridge, MD. Schedule an appointment as soon as possible for a visit in 2 weeks.   Contact information:   [place] [address] [phone]       Follow up with [name], MD. Schedule an appointment as soon as possible for a visit in 2 weeks.   Contact information:   [address] [place] [address] [phone]       Time spent on Discharge: 35 mins  Signed:   RAI,RIPUDEEP M.D. [facility] 07/20/2012, 12:20 PM Pager: [phone]

## 2012-07-20 NOTE — Progress Notes (Signed)
Patient feels well today.  Dressings clean.  No drainage.  [facility] firing.  Ok for SNF from my standpoint, may not be therapeutic on INR but ok for lovenox bridge.  F/U with me in 2 weeks.  Johnny Bridge, MD

## 2012-07-20 NOTE — Progress Notes (Signed)
Pt discharged to SNF. Pts IV was removed prior to discharge. Report was called to RN at [facility]. Pt was given coumadin 6mg  before she left. Pt left unit in a stable condition via EMS.

## 2012-07-20 NOTE — Progress Notes (Signed)
Clinical social worker assisted with patient discharge to skilled nursing facility, [facility].  CSW addressed all family questions and concerns. CSW copied chart and added all important documents. CSW also set up patient transportation with [employer] operational officer. Clinical Social Worker will sign off for now as social work intervention is no longer needed.   Rhea Pink, MSW, [phone]

## 2012-07-20 NOTE — Progress Notes (Signed)
Physical Therapy Treatment Patient Details Name: [name] [name] MRN: [id number] DOB: December 01, 1940 Today's Date: 07/20/2012 Time: [id number] PT Time Calculation (min): 17 min  PT Assessment / Plan / Recommendation Comments on Treatment Session  Making progress with mobiltiy, especially amb today. Planning to DC to SNF today.    Follow Up Recommendations  SNF;Supervision/Assistance - 24 hour     Does the patient have the potential to tolerate intense rehabilitation     Barriers to Discharge        Equipment Recommendations  Rolling walker with 5" wheels    Recommendations for Other Services    Frequency Min 4X/week   Plan Discharge plan remains appropriate;Frequency remains appropriate    Precautions / Restrictions Precautions Precautions: Fall Restrictions RUE Weight Bearing: Weight bearing as tolerated   Pertinent Vitals/Pain No complaints    Mobility  Bed Mobility Bed Mobility: Not assessed Transfers Sit to Stand: 4: Min guard;With upper extremity assist;With armrests;From chair/3-in-1 Stand to Sit: To chair/3-in-1;4: Min guard;With upper extremity assist Details for Transfer Assistance: Continued cues for safety, handplacement, and positioning of LEs Ambulation/Gait Ambulation/Gait Assistance: 4: Min guard Ambulation Distance (Feet): 100 Feet Assistive device: Rolling walker Ambulation/Gait Assistance Details: Cues for posture  Gait Pattern: Step-to pattern;Trunk flexed    Exercises     PT Diagnosis:    PT Problem List:   PT Treatment Interventions:     PT Goals Acute Rehab PT Goals PT Goal: Sit to Stand - Progress: Met PT Goal: Stand to Sit - Progress: Met PT Transfer Goal: Bed to Chair/Chair to Bed - Progress: Met PT Goal: Ambulate - Progress: Met  Visit Information  Last PT Received On: 07/20/12 Assistance Needed: +1    Subjective Data      Cognition  Cognition Arousal/Alertness: Awake/alert Behavior During Therapy: WFL for tasks  assessed/performed Overall Cognitive Status: Within Functional Limits for tasks assessed    Balance     End of Session PT - End of Session Equipment Utilized During Treatment: Gait belt Activity Tolerance: Patient tolerated treatment well   GP     Jacqualyn Posey 07/20/2012, 11:32 AM  07/20/2012 Jacqualyn Posey PTA [phone] pager [phone] office

## 2012-07-20 NOTE — Progress Notes (Signed)
ANTICOAGULATION CONSULT NOTE - Follow Up Consult  Pharmacy Consult for Coumadin and Lovenox bridge Indication: atrial fibrillation and VTE prophylaxis s/p hip surgery  Allergies  Allergen Reactions  . Procaine Hcl Anaphylaxis    Patient Measurements: Height: 5' 8.11" (173 cm) Weight: 175 lb (79.379 kg) IBW/kg (Calculated) : 64.15  Vital Signs: Temp: 97.1 F (36.2 C) (05/21 0701) BP: 115/65 mmHg (05/21 0701) Pulse Rate: 70 (05/21 0950)  Labs:  Recent Labs  07/18/12 0625 07/19/12 0437 07/20/12 0600  HGB 8.7*  --   --   HCT 25.0*  --   --   PLT 209  --   --   LABPROT 15.4* 17.5* 17.4*  INR 1.24 1.48 1.47    Estimated Creatinine Clearance: 63.1 ml/min (by C-G formula based on Cr of 0.92).  Assessment:  POD # 5 right hip repair after fall at home.  On Coumadin prior to admission for atrial fibrillation.  Home Coumadin regimen:  1.25 mg daily, except 2.5 mg on Fridays. INR on admission = 1.78    Vitamin K 2.5 mg PO given on 5/15 ~1am.    Coumadin resumed post-op 5/16, but INR not rising.    Has had Coumadin 2.5 mg, 4 mg, 6 mg, then 4 mg x 2. Much greater than home dose, but trying to overcome dose of Vitamin K given on 5/15. To change from SQ heparin to Lovenox bridge.  Discussed briefly with Dr. Mardelle Matte and Dr. Tana Coast. Will continue with low-dose Lovenox, as opposed to treatment dose.  CBC has been low. No bleeding noted.  Goal of Therapy:  INR 2-3 Monitor platelets by anticoagulation protocol: Yes   Plan:   Coumadin 6 mg today, prior to discharge.  Lovenox 40 mg SQ q24hrs to begin tonight and continue until INR >2.  Daily PT/INR in the hospital. Expect close f/u after discharge. Likely will need several more days with more than usual Coumadin doses.  Arty Baumgartner,  Page: [phone] 07/20/2012,12:04 PM

## 2012-08-16 ENCOUNTER — Encounter: Payer: Medicare Other | Admitting: Internal Medicine

## 2012-09-08 ENCOUNTER — Encounter: Payer: Self-pay | Admitting: Physician Assistant

## 2012-09-08 ENCOUNTER — Ambulatory Visit: Payer: Self-pay | Admitting: Nurse Practitioner

## 2012-09-08 ENCOUNTER — Ambulatory Visit (INDEPENDENT_AMBULATORY_CARE_PROVIDER_SITE_OTHER): Payer: Medicare Other | Admitting: *Deleted

## 2012-09-08 ENCOUNTER — Ambulatory Visit (INDEPENDENT_AMBULATORY_CARE_PROVIDER_SITE_OTHER): Payer: Medicare Other | Admitting: Physician Assistant

## 2012-09-08 VITALS — BP 130/62 | HR 62 | Ht 68.0 in | Wt 151.0 lb

## 2012-09-08 DIAGNOSIS — I4891 Unspecified atrial fibrillation: Secondary | ICD-10-CM

## 2012-09-08 DIAGNOSIS — I4892 Unspecified atrial flutter: Secondary | ICD-10-CM

## 2012-09-08 DIAGNOSIS — Z7901 Long term (current) use of anticoagulants: Secondary | ICD-10-CM

## 2012-09-08 DIAGNOSIS — I10 Essential (primary) hypertension: Secondary | ICD-10-CM

## 2012-09-08 NOTE — Progress Notes (Signed)
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 She was seen preop by Dr. Stanford Breed. 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 The patient did well through surgery and went to a nursing home for rehabilitation. She returned home yesterday. She denies any chest pain, palpitations, dyspnea, dyspnea on exertion, dizziness, or presyncope. She doesn't think her Coumadin has been checked since she's been out of the hospital.  Allergies:  -- Procaine Hcl -- Anaphylaxis  Current Outpatient Prescriptions on File Prior to Visit: albuterol (VENTOLIN HFA) 108 (90 BASE) MCG/ACT inhaler, Inhale 2 puffs into the lungs every 6 (six) hours as needed for wheezing., Disp: 1 Inhaler, Rfl: 1 aspirin EC 81 MG tablet, Take 81 mg by mouth once a week. , Disp: , Rfl:  bisacodyl (DULCOLAX) 5 MG EC tablet, Take 1 tablet (5 mg total) by mouth daily as needed., Disp: 30 tablet, Rfl: 0 calcium-vitamin D (OSCAL WITH D) 500-200 MG-UNIT per tablet, Take 1 tablet by mouth daily., Disp: , Rfl:  diltiazem (CARDIZEM CD) 240 MG 24 hr capsule, Take 240 mg by mouth daily., Disp: , Rfl:  docusate sodium 100 MG CAPS, Take 100 mg by mouth 2 (two) times daily., Disp: 10 capsule, Rfl: 0 HYDROcodone-acetaminophen (NORCO) 5-325 MG per tablet, Take 1-2 tablets by mouth every 6 (six) hours as needed for pain. MAXIMUM TOTAL ACETAMINOPHEN DOSE IS 4000 MG PER DAY, Disp: 30 tablet, Rfl: 0 insulin NPH-regular (HUMULIN 70/30) (70-30) 100 UNIT/ML injection, Inject 35 Units into the skin daily with breakfast., Disp: 10 mL, Rfl: 3 metoprolol tartrate (LOPRESSOR) 25 MG tablet, Take 0.5 tablets (12.5 mg total) by mouth 2 (two) times daily., Disp: 60 tablet, Rfl: 12 Multiple Vitamin (MULTIVITAMIN WITH MINERALS) TABS, Take 1  tablet by mouth daily., Disp: , Rfl:  phenytoin (DILANTIN) 100 MG ER capsule, Take 100 mg by mouth 3 (three) times daily. Taken with 50mg  tablet per dose to equal 150mg ., Disp: , Rfl:  phenytoin (DILANTIN) 50 MG tablet, Chew 50 mg by mouth 3 (three) times daily. Taken with 100mg  capsule per dose to equal 150mg ., Disp: , Rfl:  pravastatin (PRAVACHOL) 40 MG tablet, Take 1 tablet (40 mg total) by mouth daily., Disp: 30 tablet, Rfl: 12 ranitidine (ZANTAC) 300 MG tablet, Take 1 tablet (300 mg total) by mouth at bedtime., Disp: 30 tablet, Rfl: 3 sennosides-docusate sodium (SENOKOT-S) 8.6-50 MG tablet, Take 2 tablets by mouth daily., Disp: 30 tablet, Rfl: 1 warfarin (COUMADIN) 2.5 MG tablet, Take 1 tablet (2.5 mg total) by mouth daily., Disp: , Rfl:   No current facility-administered medications on file prior to visit.   Past Medical History:   Blindness of left eye                                        Eye muscle weakness                                            Comment:Right eye weakness after cataract surgery   Common migraine  Comment:?hx of   DIABETES MELLITUS, TYPE II                      03/27/2006    HYPERLIPIDEMIA                                  03/27/2006    RETINOPATHY, BACKGROUND NOS                     03/27/2006    HYPERTENSION                                    03/27/2006    Atrial fibrillation                                            Comment:SVT dx 2007, cath 2007 mild CAD, then had an               cardioversion, ablation; still on coumadin ,               has occ palpitation, EKG 03-2010 NSR   Headache(784.0)                                 05/07/2009     LUNG NODULE                                     09/01/2006       Comment:Excision, Bx Benighn   Osteopenia                                      2004           Comment:Dexa 2004 showed Osteopenia, DEXA 03/2007 normal   Seizures                                                      Intertrochanteric fracture of right hip         07/13/2012   Past Surgical History:   SHOULDER SURGERY                                              FEMUR IM NAIL                                   Right 07/15/2012      Comment:Procedure: INTRAMEDULLARY (IM) NAIL HIP;                Surgeon: Johnny Bridge, MD;  Location: [facility];              Service: Orthopedics;  Laterality: Right;  Review of patient's family history  indicates:   Diabetes                       Father                   Heart attack                   Father                   Diabetes                       Sister                   Cancer                         Neg Hx                     Comment: no hx of colon or breast cancer   Social History   Marital Status: Married             Spouse Name:                      Years of Education:                 Number of children: 1           Occupational History Occupation          Fish farm manager            Comment                Social History Main Topics   Smoking Status: Former Smoker                   Packs/Day: 0.00  Years:           Types: Cigarettes     Quit date: 03/02/1978   Smokeless Status: Never Used                       Alcohol Use: No             Drug Use: No             Sexual Activity: No                 Other Topics            Concern   None on file  Social History Narrative   Moved from [place] 2004   Tobacco--quit in the 80's    ROS: See history of present illness otherwise negative   PHYSICAL EXAM: Well-nournished, in no acute distress. Neck: No JVD, HJR, Bruit, or thyroid enlargement  Lungs: No tachypnea, clear without wheezing, rales, or rhonchi  Cardiovascular: RRR, PMI not displaced, heart sounds normal, no murmurs, gallops, bruit, thrill, or heave.  Abdomen: BS normal. Soft without organomegaly, masses, lesions or tenderness.  Extremities: without cyanosis, clubbing or edema. Good distal pulses bilateral  SKin: Warm, no lesions or rashes    Musculoskeletal: No deformities  Neuro: no focal signs  BP 130/62  Pulse 62  Ht 5\' 8"  (1.727 m)  Wt 151 lb (68.493 kg)  BMI 22.96 kg/m2   EKG: Normal sinus rhythm poor R. wave progression no acute change

## 2012-09-08 NOTE — Assessment & Plan Note (Signed)
In normal sinus rhythm

## 2012-09-08 NOTE — Assessment & Plan Note (Signed)
Blood pressure stable ? ?

## 2012-09-08 NOTE — Patient Instructions (Addendum)
I extensively reinforced medications management with coumadin, went over dose with pt and wife several times.

## 2012-09-08 NOTE — Assessment & Plan Note (Signed)
Patient going to Coumadin clinic now. Has not had her INR checked since hospitalization as far she knows.

## 2012-09-08 NOTE — Patient Instructions (Signed)
Your physician recommends that you continue on your current medications as directed. Please refer to the Current Medication list given to you today.  Your physician recommends that you schedule a follow-up appointment with Dr Lovena Le.

## 2012-09-13 ENCOUNTER — Telehealth: Payer: Self-pay | Admitting: Internal Medicine

## 2012-09-13 NOTE — Telephone Encounter (Signed)
Jana Half with [facility] called stating the patient had a fall and they are continuing PT and OT. She just wanted to update Dr. Larose Kells. Call her if any questions.

## 2012-09-13 NOTE — Telephone Encounter (Signed)
FYI

## 2012-09-13 NOTE — Telephone Encounter (Signed)
THX!

## 2012-09-14 ENCOUNTER — Encounter: Payer: Self-pay | Admitting: Internal Medicine

## 2012-09-14 ENCOUNTER — Ambulatory Visit (INDEPENDENT_AMBULATORY_CARE_PROVIDER_SITE_OTHER): Payer: Medicare Other | Admitting: Internal Medicine

## 2012-09-14 ENCOUNTER — Ambulatory Visit (INDEPENDENT_AMBULATORY_CARE_PROVIDER_SITE_OTHER)
Admission: RE | Admit: 2012-09-14 | Discharge: 2012-09-14 | Disposition: A | Discharge status: Home / Self Care | Payer: Medicare Other | Source: Ambulatory Visit | Attending: Internal Medicine | Admitting: Internal Medicine

## 2012-09-14 VITALS — BP 130/60 | HR 74 | Temp 97.9°F | Wt 189.2 lb

## 2012-09-14 DIAGNOSIS — G40909 Epilepsy, unspecified, not intractable, without status epilepticus: Secondary | ICD-10-CM

## 2012-09-14 DIAGNOSIS — M79609 Pain in unspecified limb: Secondary | ICD-10-CM

## 2012-09-14 DIAGNOSIS — Z7901 Long term (current) use of anticoagulants: Secondary | ICD-10-CM

## 2012-09-14 DIAGNOSIS — S72141E Displaced intertrochanteric fracture of right femur, subsequent encounter for open fracture type I or II with routine healing: Secondary | ICD-10-CM

## 2012-09-14 DIAGNOSIS — D649 Anemia, unspecified: Secondary | ICD-10-CM

## 2012-09-14 DIAGNOSIS — E871 Hypo-osmolality and hyponatremia: Secondary | ICD-10-CM

## 2012-09-14 DIAGNOSIS — S72009D Fracture of unspecified part of neck of unspecified femur, subsequent encounter for closed fracture with routine healing: Secondary | ICD-10-CM

## 2012-09-14 LAB — CBC WITH DIFFERENTIAL/PLATELET
Basophils Absolute: 0 10*3/uL (ref 0.0–0.1)
Eosinophils Relative: 1.6 % (ref 0.0–5.0)
HCT: 34.8 % — ABNORMAL LOW (ref 36.0–46.0)
Hemoglobin: 11.8 g/dL — ABNORMAL LOW (ref 12.0–15.0)
Lymphocytes Relative: 17.1 % (ref 12.0–46.0)
Monocytes Relative: 9.6 % (ref 3.0–12.0)
Neutro Abs: 4.7 10*3/uL (ref 1.4–7.7)
Platelets: 278 10*3/uL (ref 150.0–400.0)
RDW: 13.5 % (ref 11.5–14.6)
WBC: 6.5 10*3/uL (ref 4.5–10.5)

## 2012-09-14 LAB — BASIC METABOLIC PANEL
Calcium: 9.2 mg/dL (ref 8.4–10.5)
GFR: 58.81 mL/min — ABNORMAL LOW (ref >60.00)
Glucose, Bld: 320 mg/dL — ABNORMAL HIGH (ref 70–99)
Potassium: 4.3 mEq/L (ref 3.5–5.1)
Sodium: 133 mEq/L — ABNORMAL LOW (ref 135–145)

## 2012-09-14 NOTE — Patient Instructions (Addendum)
Please get your x-ray at the other [facility]  office located at: [place], across from [facility].  Please go to the basement, this is a walk-in facility, they are open from 8:30 to 5:30 PM. Phone number [phone]. --- Continue with medications as you're doing. Please call if the toe is not improving in 2 week. Next visit with me 4-6 weeks

## 2012-09-14 NOTE — Progress Notes (Signed)
Subjective:    Patient ID: [name] [name], female    DOB: 04-21-1940, 72 y.o.   MRN: [id number]  [facility] followup Admitted to the hospital after a fall, diagnosed with a right hip fracture, status post surgery 07/15/2012. The chart, labs and x-rays are reviewed and summarized below.  Past Medical History  Diagnosis Date  . Blindness of left eye   . Eye muscle weakness     Right eye weakness after cataract surgery  . Common migraine     ?hx of  . DIABETES MELLITUS, TYPE II 03/27/2006  . HYPERLIPIDEMIA 03/27/2006  . 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 HYPERTENSION 03/27/2006  . Atrial fibrillation     SVT dx 2007, cath 2007 mild CAD, then had an cardioversion, ablation; still on coumadin , has occ palpitation, EKG 03-2010 NSR  . Headache(784.0) 05/07/2009  . LUNG NODULE 09/01/2006    Excision, Bx Benighn  . Osteopenia 2004    Dexa 2004 showed Osteopenia, DEXA 03/2007 normal  . Seizures   . Intertrochanteric fracture of right hip 07/13/2012   Past Surgical History  Procedure Laterality Date  . Shoulder surgery    . Femur im nail Right 07/15/2012    Procedure: INTRAMEDULLARY (IM) NAIL HIP;  Surgeon: Johnny Bridge, MD;  Location: [facility];  Service: Orthopedics;  Laterality: Right;   History   Social History  . Marital Status: Married    Spouse Name: N/A    Number of Children: 1  . Years of Education: N/A   Occupational History  .     Social History Main Topics  . Smoking status: Former Smoker    Types: Cigarettes    Quit date: 03/02/1978  . Smokeless tobacco: Never Used  . Alcohol Use: No  . Drug Use: No  . Sexually Active: No   Other Topics Concern  . Not on file   Social History Narrative   Moved from [place] 2004   Tobacco--quit in the 80's     Review of Systems After the hospital admission went to a rehabilitation unit and eventually discharged home last week. At rehabilitation, she had cough and received breathing treatments, cough is better. Last  week at the time she arrived home, noted the L 2nd toe to be red and tender. See physical exam. Denies fever. Denies chest pain or shortness or breath No nausea, vomiting, diarrhea. No constipation. Pain at the surgical site is okay, does not need Vicodin. BPs range from 105-165, BPs in the chart very good.    Objective:   Physical Exam  Musculoskeletal:       Feet:   BP 130/60  Pulse 74  Temp(Src) 97.9 F (36.6 C) (Oral)  Wt 189 lb 3.2 oz (85.821 kg)  BMI 28.77 kg/m2  SpO2 95%  General -- alert, well-developed, NAD .   EYEs-- pale, no lesions otherwise   Lungs -- normal respiratory effort, no intercostal retractions, no accessory muscle use, and normal breath sounds.   Heart-- normal rate, regular rhythm, no murmur, and no gallop.   Extremities-- no pretibial edema bilaterally. Left second toe, see graphic. Hands are normal, no skin lesions. Feet  without any skin lesions, good capillary refills throughout Neurologic-- alert & oriented X3 and strength normal in all extremities. Psych-- Cognition and judgment appear intact. Alert and cooperative with normal attention span and concentration.  not anxious appearing and not depressed appearing.       Assessment & Plan:  Developed postop anemia, recheck a CBC.  Toe redness, Fracture? Doubt gout, embolic event or infex Plan: XR, call if no better

## 2012-09-14 NOTE — Assessment & Plan Note (Signed)
Developed hyponatremia during the hospital admission, recheck a BMP

## 2012-09-14 NOTE — Assessment & Plan Note (Signed)
anticoagulation, Coumadin was stopped during that admission, was discharged on Lovenox, she is now back on Coumadin and followup by the cardiology clinic.

## 2012-09-14 NOTE — Assessment & Plan Note (Signed)
Hip fracture, right side: She had a fall but no syncope, status post surgery 07/16/2011. Released from rehabilitation back to home last week, doing well, pain well-controlled, to start PT soon

## 2012-09-14 NOTE — Assessment & Plan Note (Signed)
history of   seizure, Dilantin was elevated, dose held x 1: check labs

## 2012-09-15 ENCOUNTER — Telehealth: Payer: Self-pay | Admitting: *Deleted

## 2012-09-15 ENCOUNTER — Telehealth: Payer: Self-pay | Admitting: Internal Medicine

## 2012-09-15 LAB — PHENYTOIN LEVEL, TOTAL: Phenytoin Lvl: 11.2 ug/mL (ref 10.0–20.0)

## 2012-09-15 MED ORDER — COLCHICINE 0.6 MG PO TABS: 0.6000 mg | ORAL_TABLET | Freq: Two times a day (BID) | ORAL | Status: DC

## 2012-09-15 NOTE — Telephone Encounter (Signed)
unfortunately no less expensive alternatives

## 2012-09-15 NOTE — Telephone Encounter (Signed)
Hemoglobin improving, continue with mild hyponatremia, could be related to increased blood sugar. X-rays of the toe were negative. Dilantin pending. 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 If she's not better she will let me know. (Colchicine may interact with diltiazem but she will take a low dose for 10 days only)

## 2012-09-15 NOTE — Telephone Encounter (Signed)
This rx is $281.57 on insurance. They would like something cheaper if possible please advise.

## 2012-09-21 ENCOUNTER — Other Ambulatory Visit: Payer: Self-pay | Admitting: Internal Medicine

## 2012-09-22 ENCOUNTER — Ambulatory Visit (INDEPENDENT_AMBULATORY_CARE_PROVIDER_SITE_OTHER): Payer: Medicare Other

## 2012-09-22 DIAGNOSIS — Z7901 Long term (current) use of anticoagulants: Secondary | ICD-10-CM

## 2012-09-22 DIAGNOSIS — I4892 Unspecified atrial flutter: Secondary | ICD-10-CM

## 2012-09-22 DIAGNOSIS — I4891 Unspecified atrial fibrillation: Secondary | ICD-10-CM

## 2012-09-22 LAB — POCT INR: INR: 1.1

## 2012-09-22 MED ORDER — WARFARIN SODIUM 2.5 MG PO TABS: 2.5000 mg | ORAL_TABLET | Freq: Every day | ORAL | Status: DC

## 2012-09-22 NOTE — Telephone Encounter (Signed)
Refill done per protocol.  

## 2012-09-23 ENCOUNTER — Ambulatory Visit (INDEPENDENT_AMBULATORY_CARE_PROVIDER_SITE_OTHER): Payer: Medicare Other | Admitting: Endocrinology

## 2012-09-23 ENCOUNTER — Encounter: Payer: Self-pay | Admitting: Endocrinology

## 2012-09-23 VITALS — BP 124/76 | HR 78 | Ht 68.0 in | Wt 190.0 lb

## 2012-09-23 DIAGNOSIS — E119 Type 2 diabetes mellitus without complications: Secondary | ICD-10-CM

## 2012-09-23 NOTE — Progress Notes (Signed)
Subjective:    Patient ID: [name] [name], female    DOB: 1940/09/26, 72 y.o.   MRN: [id number]  HPI Pt returns for f/u of insulin-requiring DM (dx'ed 1992; she has moderate neuropathy of the lower extremities, and associated retinopathy; therapy has been limited by pt's need for a simple, inexpensive insulin regimen; she had an episode of severe hypoglycemia in September, 2013).  no cbg record, but states cbg's are well-controlled. There is no trend throughout the day.   Past Medical History  Diagnosis Date  . Blindness of left eye   . Eye muscle weakness     Right eye weakness after cataract surgery  . Common migraine     ?hx of  . DIABETES MELLITUS, TYPE II 03/27/2006  . HYPERLIPIDEMIA 03/27/2006  . 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 HYPERTENSION 03/27/2006  . Atrial fibrillation     SVT dx 2007, cath 2007 mild CAD, then had an cardioversion, ablation; still on coumadin , has occ palpitation, EKG 03-2010 NSR  . Headache(784.0) 05/07/2009  . LUNG NODULE 09/01/2006    Excision, Bx Benighn  . Osteopenia 2004    Dexa 2004 showed Osteopenia, DEXA 03/2007 normal  . Seizures   . Intertrochanteric fracture of right hip 07/13/2012    Past Surgical History  Procedure Laterality Date  . Shoulder surgery    . Femur im nail Right 07/15/2012    Procedure: INTRAMEDULLARY (IM) NAIL HIP;  Surgeon: Johnny Bridge, MD;  Location: [facility];  Service: Orthopedics;  Laterality: Right;    History   Social History  . Marital Status: Married    Spouse Name: N/A    Number of Children: 1  . Years of Education: N/A   Occupational History  .     Social History Main Topics  . Smoking status: Former Smoker    Types: Cigarettes    Quit date: 03/02/1978  . Smokeless tobacco: Never Used  . Alcohol Use: No  . Drug Use: No  . Sexually Active: No   Other Topics Concern  . Not on file   Social History Narrative   Moved from [place] 2004   Tobacco--quit in the 80's    Current Outpatient  Prescriptions on File Prior to Visit  Medication Sig Dispense Refill  . albuterol (VENTOLIN HFA) 108 (90 BASE) MCG/ACT inhaler Inhale 2 puffs into the lungs every 6 (six) hours as needed for wheezing.  1 Inhaler  1  . aspirin EC 81 MG tablet Take 81 mg by mouth once a week.       . bisacodyl (DULCOLAX) 5 MG EC tablet Take 1 tablet (5 mg total) by mouth daily as needed.  30 tablet  0  . calcium-vitamin D (OSCAL WITH D) 500-200 MG-UNIT per tablet Take 1 tablet by mouth daily.      . colchicine 0.6 MG tablet Take 1 tablet (0.6 mg total) by mouth 2 (two) times daily.  60 tablet  0  . diltiazem (CARDIZEM CD) 240 MG 24 hr capsule Take 240 mg by mouth daily.      [name] docusate sodium 100 MG CAPS Take 100 mg by mouth 2 (two) times daily.  10 capsule  0  . HYDROcodone-acetaminophen (NORCO) 5-325 MG per tablet Take 1-2 tablets by mouth every 6 (six) hours as needed for pain. MAXIMUM TOTAL ACETAMINOPHEN DOSE IS 4000 MG PER DAY  30 tablet  0  . insulin NPH-regular (HUMULIN 70/30) (70-30) 100 UNIT/ML injection Inject 35 Units into the skin daily with  breakfast.  10 mL  3  . LORazepam (ATIVAN) 0.5 MG tablet Take 0.5 mg by mouth every 8 (eight) hours. 1/2 tablet twice daily      . metoprolol tartrate (LOPRESSOR) 25 MG tablet Take 0.5 tablets (12.5 mg total) by mouth 2 (two) times daily.  60 tablet  12  . Multiple Vitamin (MULTIVITAMIN WITH MINERALS) TABS Take 1 tablet by mouth daily.      . phenytoin (DILANTIN) 100 MG ER capsule Take 100 mg by mouth 3 (three) times daily. Taken with 50mg  tablet per dose to equal 150mg .      . phenytoin (DILANTIN) 50 MG tablet Chew 50 mg by mouth 3 (three) times daily. Taken with 100mg  capsule per dose to equal 150mg .      . pravastatin (PRAVACHOL) 40 MG tablet TAKE ONE TABLET BY MOUTH EVERY DAY  30 tablet  5  . ranitidine (ZANTAC) 300 MG tablet Take 1 tablet (300 mg total) by mouth at bedtime.  30 tablet  3  . sennosides-docusate sodium (SENOKOT-S) 8.6-50 MG tablet Take 2 tablets by  mouth daily.  30 tablet  1  . warfarin (COUMADIN) 2.5 MG tablet Take 1 tablet (2.5 mg total) by mouth daily.  40 tablet  1  . zolpidem (AMBIEN) 5 MG tablet Take 5 mg by mouth at bedtime as needed for sleep.       No current facility-administered medications on file prior to visit.    Allergies  Allergen Reactions  . Procaine Hcl Anaphylaxis    Family History  Problem Relation Age of Onset  . Diabetes Father   . Heart attack Father 68  . Diabetes Sister   . Cancer Neg Hx     no hx of colon or breast cancer    BP 124/76  Pulse 78  Ht 5\' 8"  (1.727 m)  Wt 190 lb (86.183 kg)  BMI 28.9 kg/m2  SpO2 98%  Review of Systems denies hypoglycemia and weight change.    Objective:   Physical Exam VITAL SIGNS:  See vs page GENERAL: no distress  Lab Results  Component Value Date   HGBA1C 6.7* 07/14/2012      Assessment & Plan:  DM: well-controlled.  This insulin regimen was chosen from multiple options, for its simplicity  The benefits of glycemic control must be weighed against the risks of hypoglycemia.

## 2012-09-23 NOTE — Patient Instructions (Addendum)
Please continue the same insulin.  Please return here in 3 months.   check your blood sugar 2 times a day.  vary the time of day when you check, between before the 3 meals, and at bedtime.  also check if you have symptoms of your blood sugar being too high or too low.  please keep a record of the readings and bring it to your next appointment here.  please call us sooner if you are having low blood sugar episodes.

## 2012-09-26 ENCOUNTER — Telehealth: Payer: Self-pay | Admitting: Internal Medicine

## 2012-09-26 ENCOUNTER — Encounter: Payer: Self-pay | Admitting: Neurology

## 2012-09-26 NOTE — Telephone Encounter (Signed)
Patient's husband is calling on behalf of the patient in regards to her Norco and Ativan prescriptions. He says that both of them fell into the toilet. Requesting a refill on both.

## 2012-09-27 ENCOUNTER — Encounter: Payer: Self-pay | Admitting: Internal Medicine

## 2012-09-27 ENCOUNTER — Encounter: Payer: Self-pay | Admitting: Nurse Practitioner

## 2012-09-27 ENCOUNTER — Ambulatory Visit (INDEPENDENT_AMBULATORY_CARE_PROVIDER_SITE_OTHER): Payer: Medicare Other | Admitting: Internal Medicine

## 2012-09-27 ENCOUNTER — Ambulatory Visit (INDEPENDENT_AMBULATORY_CARE_PROVIDER_SITE_OTHER): Payer: Medicare Other | Admitting: Nurse Practitioner

## 2012-09-27 ENCOUNTER — Ambulatory Visit (INDEPENDENT_AMBULATORY_CARE_PROVIDER_SITE_OTHER): Payer: Medicare Other | Admitting: *Deleted

## 2012-09-27 ENCOUNTER — Telehealth: Payer: Self-pay | Admitting: *Deleted

## 2012-09-27 VITALS — BP 142/62 | HR 70 | Temp 98.0°F | Ht 68.0 in | Wt 189.5 lb

## 2012-09-27 VITALS — BP 130/67 | HR 69 | Ht 68.0 in | Wt 189.4 lb

## 2012-09-27 DIAGNOSIS — Z7901 Long term (current) use of anticoagulants: Secondary | ICD-10-CM

## 2012-09-27 DIAGNOSIS — G40209 Localization-related (focal) (partial) symptomatic epilepsy and epileptic syndromes with complex partial seizures, not intractable, without status epilepticus: Secondary | ICD-10-CM

## 2012-09-27 DIAGNOSIS — I4892 Unspecified atrial flutter: Secondary | ICD-10-CM

## 2012-09-27 DIAGNOSIS — I4891 Unspecified atrial fibrillation: Secondary | ICD-10-CM

## 2012-09-27 DIAGNOSIS — I10 Essential (primary) hypertension: Secondary | ICD-10-CM

## 2012-09-27 NOTE — Telephone Encounter (Signed)
lmovm to return call  °

## 2012-09-27 NOTE — Patient Instructions (Addendum)
Continue Dilantin at current dose.  We will check Dilantin level today, blood draw.  Follow up visit in 6 months.

## 2012-09-27 NOTE — Assessment & Plan Note (Signed)
She is maintaining NSR very nicely. No change in medical therapy.

## 2012-09-27 NOTE — Telephone Encounter (Signed)
Per chart, we do not write the Norco (Dr. Mardelle Matte). Rx for Ativan was written 09/08/2012. Last OV 09/14/2012 Please advise.

## 2012-09-27 NOTE — Assessment & Plan Note (Signed)
Her blood pressure has been well controlled. No change in medical therapy.

## 2012-09-27 NOTE — Telephone Encounter (Signed)
Dr. Larose Kells, I cannot find record of our office prescribing either of these medications. Last time ativan was filled on 09/08/12 by a Historical Provider and Last time Norco was filled was on 07/15/12 by Dr. Mardelle Matte.

## 2012-09-27 NOTE — Progress Notes (Signed)
HPI Mrs. benedix returns today for followup. She is a pleasant 72 yo woman with a h/o PAF, HTN, DM, who recently fell and broke her hip. She has been in a SNF recovering. She denies chest pain or sob. Her strength in her legs is improved. She has lost weight because her husband has been doing all of the cooking. No syncope or palpitations.  Allergies  Allergen Reactions  . Procaine Hcl Anaphylaxis     Current Outpatient Prescriptions  Medication Sig Dispense Refill  . albuterol (VENTOLIN HFA) 108 (90 BASE) MCG/ACT inhaler Inhale 2 puffs into the lungs every 6 (six) hours as needed for wheezing.  1 Inhaler  1  . aspirin EC 81 MG tablet Take 81 mg by mouth once a week.       . bisacodyl (DULCOLAX) 5 MG EC tablet Take 1 tablet (5 mg total) by mouth daily as needed.  30 tablet  0  . calcium-vitamin D (OSCAL WITH D) 500-200 MG-UNIT per tablet Take 1 tablet by mouth daily.      . colchicine 0.6 MG tablet Take 1 tablet (0.6 mg total) by mouth 2 (two) times daily.  60 tablet  0  . diltiazem (CARDIZEM CD) 240 MG 24 hr capsule Take 240 mg by mouth daily.      [name] docusate sodium 100 MG CAPS Take 100 mg by mouth 2 (two) times daily.  10 capsule  0  . HYDROcodone-acetaminophen (NORCO) 5-325 MG per tablet Take 1-2 tablets by mouth every 6 (six) hours as needed for pain. MAXIMUM TOTAL ACETAMINOPHEN DOSE IS 4000 MG PER DAY  30 tablet  0  . insulin NPH-regular (HUMULIN 70/30) (70-30) 100 UNIT/ML injection Inject 35 Units into the skin daily with breakfast.  10 mL  3  . LORazepam (ATIVAN) 0.5 MG tablet Take 0.5 mg by mouth every 8 (eight) hours. 1/2 tablet twice daily      . metoprolol tartrate (LOPRESSOR) 25 MG tablet Take 0.5 tablets (12.5 mg total) by mouth 2 (two) times daily.  60 tablet  12  . Multiple Vitamin (MULTIVITAMIN WITH MINERALS) TABS Take 1 tablet by mouth daily.      . phenytoin (DILANTIN) 100 MG ER capsule Take 100 mg by mouth 3 (three) times daily. Taken with 50mg  tablet per dose to equal 150mg .       . phenytoin (DILANTIN) 50 MG tablet Chew 50 mg by mouth 3 (three) times daily. Taken with 100mg  capsule per dose to equal 150mg .      . pravastatin (PRAVACHOL) 40 MG tablet TAKE ONE TABLET BY MOUTH EVERY DAY  30 tablet  5  . ranitidine (ZANTAC) 300 MG tablet Take 1 tablet (300 mg total) by mouth at bedtime.  30 tablet  3  . sennosides-docusate sodium (SENOKOT-S) 8.6-50 MG tablet Take 2 tablets by mouth daily.  30 tablet  1  . warfarin (COUMADIN) 2.5 MG tablet Take 1 tablet (2.5 mg total) by mouth daily.  40 tablet  1  . zolpidem (AMBIEN) 5 MG tablet Take 5 mg by mouth at bedtime as needed for sleep.       No current facility-administered medications for this visit.     Past Medical History  Diagnosis Date  . Blindness of left eye   . Eye muscle weakness     Right eye weakness after cataract surgery  . Common migraine     ?hx of  . DIABETES MELLITUS, TYPE II 03/27/2006  . HYPERLIPIDEMIA 03/27/2006  . RETINOPATHY, BACKGROUND  NOS 03/27/2006  . HYPERTENSION 03/27/2006  . Atrial fibrillation     SVT dx 2007, cath 2007 mild CAD, then had an cardioversion, ablation; still on coumadin , has occ palpitation, EKG 03-2010 NSR  . Headache(784.0) 05/07/2009  . LUNG NODULE 09/01/2006    Excision, Bx Benighn  . Osteopenia 2004    Dexa 2004 showed Osteopenia, DEXA 03/2007 normal  . Seizures   . Intertrochanteric fracture of right hip 07/13/2012  . Herpes encephalitis     ROS:   All systems reviewed and negative except as noted in the HPI.   Past Surgical History  Procedure Laterality Date  . Shoulder surgery    . Femur im nail Right 07/15/2012    Procedure: INTRAMEDULLARY (IM) NAIL HIP;  Surgeon: Johnny Bridge, MD;  Location: [place];  Service: Orthopedics;  Laterality: Right;     Family History  Problem Relation Age of Onset  . Diabetes Father   . Heart attack Father 66  . Diabetes Sister   . Cancer Neg Hx     no hx of colon or breast cancer     History   Social History  .  Marital Status: Married    Spouse Name: Jenny Reichmann    Number of Children: 1  . Years of Education: College   Occupational History  .     Social History Main Topics  . Smoking status: Former Smoker    Types: Cigarettes    Quit date: 03/02/1978  . Smokeless tobacco: Never Used  . Alcohol Use: No  . Drug Use: No  . Sexually Active: No   Other Topics Concern  . Not on file   Social History Narrative   Patient lives at home spouse.   Moved from [place] 2004   Tobacco--quit in the 80's     BP 130/67  Pulse 69  Ht 5\' 8"  (1.727 m)  Wt 189 lb 6.4 oz (85.911 kg)  BMI 28.8 kg/m2  Physical Exam:  stable appearing 72 yo woman,NAD HEENT: Unremarkable Neck:  7 cm JVD, no thyromegally Lungs:  Clear with no wheezes, rales, or rhonchi HEART:  Regular rate rhythm, no murmurs, no rubs, no clicks Abd:  soft, positive bowel sounds, no organomegally, no rebound, no guarding Ext:  2 plus pulses, no edema, no cyanosis, no clubbing Skin:  No rashes no nodules Neuro:  CN II through XII intact, motor grossly intact  EKG - NSR   Assess/Plan:

## 2012-09-27 NOTE — Telephone Encounter (Signed)
Message copied by Obie Dredge I on Tue Sep 27, 2012  4:55 PM ------      Message from: [name] E      Created: Tue Sep 27, 2012  4:49 PM      Regarding: [name] [name]        Before I answer the phone call, call the patient, asked her to call the original prescribe for refills. Sometimes it tell the patient's I will Take over the refills, that may have been the case, if she  mentions that let me know ------

## 2012-09-27 NOTE — Progress Notes (Signed)
[facility]  PATIENT: [name] [name] DOB: [birth year]   HISTORY FROM: patient, spouse REASON FOR VISIT: routine follow up  HISTORY OF PRESENT ILLNESS:  Update 09/27/12 (LL):  Patient comes in for 6 month follow up for seizures.  Doing well, had fractured right hip recently from fall in yard, status post surgery 07/15/12.  Released from rehab to home 3 weeks ago.  Ambulating with cane, doing well.  No further seizures since initial episode.  She is on Dilantin 150 mg 3 times a day and seems to the tolerating it well without any dizziness, imbalance, diplopia or dysarthria.  Thinks that memory is stable, only notable trouble is remembering numbers.  Still has mild tremor of the right hand, reports it is better with Ativan.  No new complaints.  Update January 21st 2014 (PS): She returns for followup after last visit on 12/11/2011. She continues to do well without any recurrent seizures following the one during her initial hospitalization in March 2013. She is on Dilantin 150 mg 3 times a day and seems to the tolerating it well without any dizziness, imbalance, diplopia or dysarthria. She continues to have mild short-term memory difficulties which are mainly related to her new memory. She can remember remote incidents quite well. She also complains of mild action tremor of the right hand but it has not been disabling.  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 She also has mild diabetic peripheral neuropathy. Overall doing well. One syncopal episode related to hypoglycemia.  REVIEW OF SYSTEMS: Full 14 system review of systems performed and notable only for: constitutional: Weight loss  cardiovascular: Swelling in legs respiratory: N/A endocrine: N/A  ear/nose/throat: N/A  musculoskeletal: Aching muscles skin:  N/A genitourinary: N/A Gastrointestinal: N/A allergy/immunology: N/A neurological: Memory loss sleep: N/A psychiatric: N/A   ALLERGIES: Allergies  Allergen Reactions  . Procaine Hcl Anaphylaxis    HOME MEDICATIONS: Outpatient Prescriptions Prior to Visit  Medication Sig Dispense Refill  . albuterol (VENTOLIN HFA) 108 (90 BASE) MCG/ACT inhaler Inhale 2 puffs into the lungs every 6 (six) hours as needed for wheezing.  1 Inhaler  1  . aspirin EC 81 MG tablet Take 81 mg by mouth once a week.       . bisacodyl (DULCOLAX) 5 MG EC tablet Take 1 tablet (5 mg total) by mouth daily as needed.  30 tablet  0  . calcium-vitamin D (OSCAL WITH D) 500-200 MG-UNIT per tablet Take 1 tablet by mouth daily.      . colchicine 0.6 MG tablet Take 1 tablet (0.6 mg total) by mouth 2 (two) times daily.  60 tablet  0  . diltiazem (CARDIZEM CD) 240 MG 24 hr capsule Take 240 mg by mouth daily.      [name] docusate sodium 100 MG CAPS Take 100 mg by mouth 2 (two) times daily.  10 capsule  0  . HYDROcodone-acetaminophen (NORCO) 5-325 MG per tablet Take 1-2 tablets by mouth every 6 (six) hours as needed for pain. MAXIMUM TOTAL ACETAMINOPHEN DOSE IS 4000 MG PER DAY  30 tablet  0  . insulin NPH-regular (HUMULIN 70/30) (70-30) 100 UNIT/ML injection Inject 35 Units into the skin daily with breakfast.  10 mL  3  . LORazepam (ATIVAN) 0.5 MG tablet Take 0.5 mg by mouth every 8 (eight) hours. 1/2 tablet twice daily      .  metoprolol tartrate (LOPRESSOR) 25 MG tablet Take 0.5 tablets (12.5 mg total) by mouth 2 (two) times daily.  60 tablet  12  . Multiple Vitamin (MULTIVITAMIN WITH MINERALS) TABS Take 1 tablet by mouth daily.      . phenytoin (DILANTIN) 100 MG ER capsule Take 100 mg by mouth 3 (three) times daily. Taken with 50mg  tablet per dose to equal 150mg .      . phenytoin (DILANTIN) 50 MG tablet Chew 50 mg by mouth 3 (three) times daily. Taken with 100mg  capsule per dose to equal 150mg .      . pravastatin (PRAVACHOL) 40 MG  tablet TAKE ONE TABLET BY MOUTH EVERY DAY  30 tablet  5  . ranitidine (ZANTAC) 300 MG tablet Take 1 tablet (300 mg total) by mouth at bedtime.  30 tablet  3  . sennosides-docusate sodium (SENOKOT-S) 8.6-50 MG tablet Take 2 tablets by mouth daily.  30 tablet  1  . warfarin (COUMADIN) 2.5 MG tablet Take 1 tablet (2.5 mg total) by mouth daily.  40 tablet  1  . zolpidem (AMBIEN) 5 MG tablet Take 5 mg by mouth at bedtime as needed for sleep.       No facility-administered medications prior to visit.    PAST MEDICAL HISTORY: Past Medical History  Diagnosis Date  . Blindness of left eye   . Eye muscle weakness     Right eye weakness after cataract surgery  . Common migraine     ?hx of  . DIABETES MELLITUS, TYPE II 03/27/2006  . HYPERLIPIDEMIA 03/27/2006  . 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 HYPERTENSION 03/27/2006  . Atrial fibrillation     SVT dx 2007, cath 2007 mild CAD, then had an cardioversion, ablation; still on coumadin , has occ palpitation, EKG 03-2010 NSR  . Headache(784.0) 05/07/2009  . LUNG NODULE 09/01/2006    Excision, Bx Benighn  . Osteopenia 2004    Dexa 2004 showed Osteopenia, DEXA 03/2007 normal  . Seizures   . Intertrochanteric fracture of right hip 07/13/2012  . Herpes encephalitis     PAST SURGICAL HISTORY: Past Surgical History  Procedure Laterality Date  . Shoulder surgery    . Femur im nail Right 07/15/2012    Procedure: INTRAMEDULLARY (IM) NAIL HIP;  Surgeon: Johnny Bridge, MD;  Location: [place];  Service: Orthopedics;  Laterality: Right;    FAMILY HISTORY: Family History  Problem Relation Age of Onset  . Diabetes Father   . Heart attack Father 46  . Diabetes Sister   . Cancer Neg Hx     no hx of colon or breast cancer    SOCIAL HISTORY: History   Social History  . Marital Status: Married    Spouse Name: Jenny Reichmann    Number of Children: 1  . Years of Education: College   Occupational History  .     Social History Main Topics  . Smoking status:  Former Smoker    Types: Cigarettes    Quit date: 03/02/1978  . Smokeless tobacco: Never Used  . Alcohol Use: No  . Drug Use: No  . Sexually Active: No   Other Topics Concern  . Not on file   Social History Narrative   Patient lives at home spouse.   Moved from [place] 2004   Tobacco--quit in the 80's     PHYSICAL EXAM  Filed Vitals:   09/27/12 1340  BP: 142/62  Pulse: 70  Temp: 98 F (36.7 C)  TempSrc: Oral  Height: 5'  8" (1.727 m)  Weight: 189 lb 8 oz (85.957 kg)   Body mass index is 28.82 kg/(m^2).  Generalized: In no acute distress, very pleasant Caucasian female  Neck: Supple, no carotid bruits   Cardiac: Regular rate rhythm, no murmur   Pulmonary: Clear to auscultation bilaterally   Musculoskeletal: No deformity   Neurological examination   Mentation: Alert oriented to time, place, history taking, language fluent, and casual conversation. MMSE not tested today (patient rushed for time, has another Dr. Hilaria Ota). (MMSE 25/30, AFT 6, Clock drawing 3/4 ALL LAST VISIT).  Cranial nerve II-XII: Pupils were equal round reactive to light extraocular movements were full, LEGALLY BLIND L EYE. facial sensation and strength were normal. hearing was intact to finger rubbing bilaterally. Uvula tongue midline. head turning and shoulder shrug and were normal and symmetric.Tongue protrusion into cheek strength was normal. MOTOR: normal bulk and tone, full strength in the BUE, decreased strength 4/5 hip flexion RLE (due to recent surgery) ,  LLE 5/5 strength. fine finger movements normal, no pronator drift.  Fine action and resting tremor of right hand.  No cogwheel rigidity or bradykinesia. SENSORY: normal and symmetric to light touch Vibration and position impaired over toes. COORDINATION: finger-nose-finger, heel-to-shin bilaterally, there was no truncal ataxia REFLEXES: Brachioradialis 2/2, biceps 2/2, triceps 2/2, patellar 1/1, Achilles trace/trace GAIT/STATION: Rising up from  seated position without assistance, wide stance, without trunk ataxia, slow but steady athralgic gait with cane.  Unable to perform tiptoe, heel walking or tandem.   DIAGNOSTIC DATA (LABS, IMAGING, TESTING) - I reviewed patient records, labs, notes, testing and imaging myself where available.  Lab Results  Component Value Date   WBC 6.5 09/14/2012   HGB 11.8* 09/14/2012   HCT 34.8* 09/14/2012   MCV 93.0 09/14/2012   PLT 278.0 09/14/2012      Component Value Date/Time   NA 133* 09/14/2012 1156   K 4.3 09/14/2012 1156   CL 98 09/14/2012 1156   CO2 28 09/14/2012 1156   GLUCOSE 320* 09/14/2012 1156   BUN 18 09/14/2012 1156   CREATININE 1.0 09/14/2012 1156   CALCIUM 9.2 09/14/2012 1156   PROT 7.4 03/01/2012 1055   ALBUMIN 3.6 03/01/2012 1055   AST 19 03/01/2012 1055   ALT 12 03/01/2012 1055   ALKPHOS 161* 03/01/2012 1055   BILITOT 0.3 03/01/2012 1055   GFRNONAA 62* 07/17/2012 0550   GFRAA 71* 07/17/2012 0550   Lab Results  Component Value Date   CHOL 176 04/05/2012   HDL 60.60 04/05/2012   LDLCALC 92 04/05/2012   TRIG 115.0 04/05/2012   CHOLHDL 3 04/05/2012   Lab Results  Component Value Date   HGBA1C 6.7* 07/14/2012    ASSESSMENT AND PLAN 72 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 She also has mild diabetic peripheral neuropathy. Overall doing well.  Continue Dilantin 100 mg ER capsule 3 times daily and Dilantin 50 mg tablet 3 times daily for seizure prophylaxis.  Check Dilantin level today. Continue to perform I'm challenging activities such as to do go, cross were puzzles. She is not driving and has not in the last 15 years secondary to legally blind in left eye.  We will recheck MMSE at next visit. Followup in 6 months.   Chantrell Apsey NP-C 09/27/2012, 1:47 PM  [facility] [address] [place], [place]  [address] [phone]  I have  personally examined this patient, reviewed pertinent data, developed plan of care and discussed with patient and agree with above.  Antony Contras, MD

## 2012-09-28 ENCOUNTER — Telehealth: Payer: Self-pay | Admitting: Internal Medicine

## 2012-09-28 LAB — PHENYTOIN LEVEL, TOTAL: Phenytoin Lvl: 7.1 ug/mL — ABNORMAL LOW (ref 10.0–20.0)

## 2012-09-28 MED ORDER — LORAZEPAM 0.5 MG PO TABS: 0.5000 mg | ORAL_TABLET | Freq: Two times a day (BID) | ORAL | Status: DC | PRN

## 2012-09-28 MED ORDER — HYDROCODONE-ACETAMINOPHEN 5-325 MG PO TABS: 1.0000 | ORAL_TABLET | Freq: Three times a day (TID) | ORAL | Status: DC | PRN

## 2012-09-28 NOTE — Telephone Encounter (Signed)
Pt. Husband made aware of need for pt. To pick up rx in person. Front desk made aware of need for contract and UDS

## 2012-09-28 NOTE — Telephone Encounter (Addendum)
per patient's husband, that was a started a the rehabilitation facility after a broken hip. Will refill.

## 2012-09-28 NOTE — Telephone Encounter (Signed)
Patient's husband is returning missed call.

## 2012-09-28 NOTE — Telephone Encounter (Signed)
Pt. Husband made aware of need for pt. To come in person to pick up Rx. Front desk made aware of need for UDS and contract

## 2012-09-28 NOTE — Telephone Encounter (Signed)
Spoke with patient's husband Jakaria Battaglino, (he called in regarding refill request) he states the rx were originally filled at the skilled nursing/rehab facility when she broke her hip. Please advise. Thanks.

## 2012-09-28 NOTE — Telephone Encounter (Signed)
RFs provided, see previous phone call.

## 2012-09-28 NOTE — Telephone Encounter (Signed)
Patient's son is calling on behalf of his mother in regards to her HYDROcodone-acetaminophen ([place]) 5-325 MG per tablet medication. He states that she dropped them in the toilet and Dr. Larose Kells wrote her another script today but [facility] will not release the medication unless we authorize them to because it has not been 30 days. He is asking that we call [facility] to authorize.

## 2012-09-29 MED ORDER — HYDROCODONE-ACETAMINOPHEN 5-325 MG PO TABS: 1.0000 | ORAL_TABLET | Freq: Three times a day (TID) | ORAL | Status: DC | PRN

## 2012-09-29 NOTE — Telephone Encounter (Signed)
Spoke with patient and husband. They state that after picking up Rx for Hydrocodone written yesterday from [facility], he was attempting to give the patient a hydrocodonel while in the bathroom and the bottle slipped out of this hands and fell into the commode ruining all of the pills. Is requesting another rx for hydrocodone. Please advise.

## 2012-09-29 NOTE — Telephone Encounter (Signed)
Okay to authorize #30, no refills. Also, medication is to be handled by the patient or her husband, nobody else.

## 2012-09-29 NOTE — Telephone Encounter (Signed)
Refill done per orders. Spoke with patient directly and made her aware of Dr. Larose Kells instructions.

## 2012-09-30 NOTE — Progress Notes (Signed)
Quick Note:  Spoke with patient and relayed results of blood work and advised to have Phenytoin level checked by Dr Larose Kells. The patient was also reminded of any future appointments. Patient understood and had no questions.  ______

## 2012-10-03 ENCOUNTER — Telehealth: Payer: Self-pay | Admitting: Internal Medicine

## 2012-10-03 NOTE — Telephone Encounter (Signed)
Note from Dr.Landau , he prescribed Norco #50 on 09/26/2012, she may have gotten the prescription shortly after they request a prescription from  this office .  Nevertheless please call the patient on remind her she can not get pain medicines from multiple physicians .

## 2012-10-04 ENCOUNTER — Encounter: Payer: Medicare Other | Admitting: Internal Medicine

## 2012-10-04 NOTE — Telephone Encounter (Signed)
Thank you :)

## 2012-10-04 NOTE — Telephone Encounter (Signed)
Spoke with pt and husband to make aware that all controlled medications need to be prescribed by one physician when possible.  I cautioned pt and husband on safety regarding maintaining control of all prescribed medications as they are ultimately responsible for them. Patient and husband both stated that this would not happen again and apologized for the inconvenience it caused our office.  Patient also stated that neuro has requested a dilantin level be drawn at pts next appt.[name] [name]

## 2012-10-05 ENCOUNTER — Other Ambulatory Visit: Payer: Self-pay

## 2012-10-06 ENCOUNTER — Ambulatory Visit (INDEPENDENT_AMBULATORY_CARE_PROVIDER_SITE_OTHER): Payer: Medicare Other | Admitting: *Deleted

## 2012-10-06 DIAGNOSIS — Z7901 Long term (current) use of anticoagulants: Secondary | ICD-10-CM

## 2012-10-06 DIAGNOSIS — I4891 Unspecified atrial fibrillation: Secondary | ICD-10-CM

## 2012-10-06 DIAGNOSIS — I4892 Unspecified atrial flutter: Secondary | ICD-10-CM

## 2012-10-14 ENCOUNTER — Ambulatory Visit (INDEPENDENT_AMBULATORY_CARE_PROVIDER_SITE_OTHER): Payer: Medicare Other | Admitting: Pharmacist

## 2012-10-14 DIAGNOSIS — I4892 Unspecified atrial flutter: Secondary | ICD-10-CM

## 2012-10-14 DIAGNOSIS — I4891 Unspecified atrial fibrillation: Secondary | ICD-10-CM

## 2012-10-14 DIAGNOSIS — Z7901 Long term (current) use of anticoagulants: Secondary | ICD-10-CM

## 2012-10-17 ENCOUNTER — Encounter: Payer: Self-pay | Admitting: Lab

## 2012-10-18 ENCOUNTER — Other Ambulatory Visit: Payer: Self-pay | Admitting: *Deleted

## 2012-10-18 ENCOUNTER — Ambulatory Visit (INDEPENDENT_AMBULATORY_CARE_PROVIDER_SITE_OTHER): Payer: Medicare Other | Admitting: Internal Medicine

## 2012-10-18 VITALS — BP 130/70 | HR 61 | Temp 97.7°F | Wt 189.8 lb

## 2012-10-18 DIAGNOSIS — S72009S Fracture of unspecified part of neck of unspecified femur, sequela: Secondary | ICD-10-CM

## 2012-10-18 DIAGNOSIS — I10 Essential (primary) hypertension: Secondary | ICD-10-CM

## 2012-10-18 DIAGNOSIS — S72141S Displaced intertrochanteric fracture of right femur, sequela: Secondary | ICD-10-CM

## 2012-10-18 DIAGNOSIS — G40909 Epilepsy, unspecified, not intractable, without status epilepticus: Secondary | ICD-10-CM

## 2012-10-18 MED ORDER — LORAZEPAM 0.5 MG PO TABS: 0.5000 mg | ORAL_TABLET | Freq: Two times a day (BID) | ORAL | Status: DC | PRN

## 2012-10-18 MED ORDER — ZOLPIDEM TARTRATE 5 MG PO TABS: 5.0000 mg | ORAL_TABLET | Freq: Every evening | ORAL | Status: DC | PRN

## 2012-10-18 MED ORDER — ALBUTEROL SULFATE HFA 108 (90 BASE) MCG/ACT IN AERS: 2.0000 | INHALATION_SPRAY | Freq: Four times a day (QID) | RESPIRATORY_TRACT | Status: DC | PRN

## 2012-10-18 MED ORDER — METOPROLOL TARTRATE 25 MG PO TABS: 12.5000 mg | ORAL_TABLET | Freq: Two times a day (BID) | ORAL | Status: DC

## 2012-10-18 MED ORDER — DILTIAZEM HCL ER COATED BEADS 240 MG PO CP24: 240.0000 mg | ORAL_CAPSULE | Freq: Every day | ORAL | Status: DC

## 2012-10-18 NOTE — Assessment & Plan Note (Signed)
Doing well, needs Dilantin level recheck.

## 2012-10-18 NOTE — Telephone Encounter (Signed)
During OV 10/18/12 verbal order Dr. Larose Kells to refill the following medications for 90 days and 1 RF: diltiazem, metoprolol, albuterol, and pravastatin. Pravastatin already called in for 6 month supply in July 2014. All other orders enacted. Verbal orders also received to send refill for Ativan #60 and 1 RF and for Ambien #30 and 2 RF. Orders placed.

## 2012-10-18 NOTE — Progress Notes (Signed)
  Subjective:    Patient ID: [name] [name], female    DOB: 1940/05/13, 72 y.o.   MRN: [id number]  HPI Followup from previous visit. In general feels well, good medication compliance. Went to see cardiology and neurology, she was felt to be stable. Dilantin level was slightly low, needs a recheck.  Past Medical History  Diagnosis Date  . Blindness of left eye   . Eye muscle weakness     Right eye weakness after cataract surgery  . Common migraine     ?hx of  . DIABETES MELLITUS, TYPE II 03/27/2006  . HYPERLIPIDEMIA 03/27/2006  . 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 HYPERTENSION 03/27/2006  . Atrial fibrillation     SVT dx 2007, cath 2007 mild CAD, then had an cardioversion, ablation; still on coumadin , has occ palpitation, EKG 03-2010 NSR  . Headache(784.0) 05/07/2009  . LUNG NODULE 09/01/2006    Excision, Bx Benighn  . Osteopenia 2004    Dexa 2004 showed Osteopenia, DEXA 03/2007 normal  . Seizures   . Intertrochanteric fracture of right hip 07/13/2012  . Herpes encephalitis    Past Surgical History  Procedure Laterality Date  . Shoulder surgery    . Femur im nail Right 07/15/2012    Procedure: INTRAMEDULLARY (IM) NAIL HIP;  Surgeon: Johnny Bridge, MD;  Location: [place];  Service: Orthopedics;  Laterality: Right;   History   Social History  . Marital Status: Married    Spouse Name: Jenny Reichmann    Number of Children: 1  . Years of Education: College   Occupational History  .     Social History Main Topics  . Smoking status: Former Smoker    Types: Cigarettes    Quit date: 03/02/1978  . Smokeless tobacco: Never Used  . Alcohol Use: No  . Drug Use: No  . Sexual Activity: No   Other Topics Concern  . Not on file   Social History Narrative   Patient lives at home spouse.   Moved from [place] 2004   Tobacco--quit in the 80's     Review of Systems No chest pain shortness or breath No seizure activity Status post physical therapy, it help with her mobility.     Objective:   Physical Exam BP 130/70  Pulse 61  Temp(Src) 97.7 F (36.5 C) (Oral)  Wt 189 lb 12.8 oz (86.093 kg)  BMI 28.87 kg/m2  SpO2 96%  General -- alert, well-developed, NAD. Definitely looks more mobile and strong   Lungs -- normal respiratory effort, no intercostal retractions, no accessory muscle use, and normal breath sounds.  Heart-- normal rate, regular rhythm, no murmur.    Extremities--redness at the second toe left foot resolved Psych-- Cognition and judgment appear intact. Alert and cooperative with normal attention span and concentration. not anxious appearing and not depressed appearing.      Assessment & Plan:  Needs a number of refills

## 2012-10-19 ENCOUNTER — Encounter: Payer: Self-pay | Admitting: Internal Medicine

## 2012-10-19 LAB — PHENYTOIN LEVEL, TOTAL: Phenytoin Lvl: 8 ug/mL — ABNORMAL LOW (ref 10.0–20.0)

## 2012-10-19 NOTE — Assessment & Plan Note (Signed)
Stable, no changes, RF  meds

## 2012-10-19 NOTE — Assessment & Plan Note (Signed)
S/p PT regaining mobility

## 2012-10-22 ENCOUNTER — Other Ambulatory Visit: Payer: Self-pay

## 2012-10-22 DIAGNOSIS — G40909 Epilepsy, unspecified, not intractable, without status epilepticus: Secondary | ICD-10-CM

## 2012-10-22 MED ORDER — PHENYTOIN 50 MG PO CHEW: 50.0000 mg | CHEWABLE_TABLET | Freq: Three times a day (TID) | ORAL | Status: DC

## 2012-10-22 MED ORDER — PHENYTOIN SODIUM EXTENDED 100 MG PO CAPS: 100.0000 mg | ORAL_CAPSULE | Freq: Three times a day (TID) | ORAL | Status: DC

## 2012-10-24 ENCOUNTER — Telehealth: Payer: Self-pay | Admitting: Nurse Practitioner

## 2012-10-24 DIAGNOSIS — R569 Unspecified convulsions: Secondary | ICD-10-CM

## 2012-10-24 MED ORDER — PHENYTOIN SODIUM EXTENDED 100 MG PO CAPS: 200.0000 mg | ORAL_CAPSULE | Freq: Three times a day (TID) | ORAL | Status: DC

## 2012-10-24 NOTE — Telephone Encounter (Signed)
Dilantin level low on last 2 recent lab draws.  Spoke to patient and instructed to increase dose to Dilantin ER 200 mg three times daily.  Previous was 150 mg three times daily.  Recheck dilantin level in 4 weeks, may be done at Dr. Ethel Rana office if more convenient.  Patient verbalized understanding.

## 2012-10-28 ENCOUNTER — Ambulatory Visit (INDEPENDENT_AMBULATORY_CARE_PROVIDER_SITE_OTHER): Payer: Medicare Other

## 2012-10-28 DIAGNOSIS — I4892 Unspecified atrial flutter: Secondary | ICD-10-CM

## 2012-10-28 DIAGNOSIS — Z7901 Long term (current) use of anticoagulants: Secondary | ICD-10-CM

## 2012-10-28 DIAGNOSIS — I4891 Unspecified atrial fibrillation: Secondary | ICD-10-CM

## 2012-10-28 LAB — POCT INR: INR: 1.1

## 2012-11-04 ENCOUNTER — Ambulatory Visit (INDEPENDENT_AMBULATORY_CARE_PROVIDER_SITE_OTHER): Payer: Medicare Other | Admitting: *Deleted

## 2012-11-04 DIAGNOSIS — I4892 Unspecified atrial flutter: Secondary | ICD-10-CM

## 2012-11-04 DIAGNOSIS — Z7901 Long term (current) use of anticoagulants: Secondary | ICD-10-CM

## 2012-11-04 DIAGNOSIS — I4891 Unspecified atrial fibrillation: Secondary | ICD-10-CM

## 2012-11-04 LAB — POCT INR: INR: 1.8

## 2012-11-18 ENCOUNTER — Emergency Department (HOSPITAL_BASED_OUTPATIENT_CLINIC_OR_DEPARTMENT_OTHER): Payer: Medicare Other

## 2012-11-18 ENCOUNTER — Inpatient Hospital Stay (HOSPITAL_COMMUNITY): Payer: Medicare Other

## 2012-11-18 ENCOUNTER — Inpatient Hospital Stay (HOSPITAL_BASED_OUTPATIENT_CLINIC_OR_DEPARTMENT_OTHER)
Admission: EM | Admit: 2012-11-18 | Discharge: 2012-11-21 | DRG: 071 | Disposition: A | Discharge status: Home / Self Care | Payer: Medicare Other | Attending: Internal Medicine | Admitting: Internal Medicine

## 2012-11-18 ENCOUNTER — Encounter (HOSPITAL_BASED_OUTPATIENT_CLINIC_OR_DEPARTMENT_OTHER): Payer: Self-pay

## 2012-11-18 DIAGNOSIS — H53469 Homonymous bilateral field defects, unspecified side: Secondary | ICD-10-CM | POA: Diagnosis present

## 2012-11-18 DIAGNOSIS — R4182 Altered mental status, unspecified: Secondary | ICD-10-CM

## 2012-11-18 DIAGNOSIS — G40909 Epilepsy, unspecified, not intractable, without status epilepticus: Secondary | ICD-10-CM | POA: Diagnosis present

## 2012-11-18 DIAGNOSIS — E119 Type 2 diabetes mellitus without complications: Secondary | ICD-10-CM

## 2012-11-18 DIAGNOSIS — H5440 Blindness, one eye, unspecified eye: Secondary | ICD-10-CM | POA: Diagnosis present

## 2012-11-18 DIAGNOSIS — Z7901 Long term (current) use of anticoagulants: Secondary | ICD-10-CM

## 2012-11-18 DIAGNOSIS — I10 Essential (primary) hypertension: Secondary | ICD-10-CM | POA: Diagnosis present

## 2012-11-18 DIAGNOSIS — N390 Urinary tract infection, site not specified: Secondary | ICD-10-CM

## 2012-11-18 DIAGNOSIS — Z7982 Long term (current) use of aspirin: Secondary | ICD-10-CM

## 2012-11-18 DIAGNOSIS — M899 Disorder of bone, unspecified: Secondary | ICD-10-CM | POA: Diagnosis present

## 2012-11-18 DIAGNOSIS — E785 Hyperlipidemia, unspecified: Secondary | ICD-10-CM | POA: Diagnosis present

## 2012-11-18 DIAGNOSIS — E1169 Type 2 diabetes mellitus with other specified complication: Secondary | ICD-10-CM | POA: Diagnosis present

## 2012-11-18 DIAGNOSIS — Z87891 Personal history of nicotine dependence: Secondary | ICD-10-CM

## 2012-11-18 DIAGNOSIS — Z8661 Personal history of infections of the central nervous system: Secondary | ICD-10-CM

## 2012-11-18 DIAGNOSIS — G9340 Encephalopathy, unspecified: Secondary | ICD-10-CM | POA: Diagnosis present

## 2012-11-18 DIAGNOSIS — T420X5A Adverse effect of hydantoin derivatives, initial encounter: Secondary | ICD-10-CM | POA: Diagnosis present

## 2012-11-18 DIAGNOSIS — I4891 Unspecified atrial fibrillation: Secondary | ICD-10-CM

## 2012-11-18 DIAGNOSIS — Z79899 Other long term (current) drug therapy: Secondary | ICD-10-CM

## 2012-11-18 DIAGNOSIS — Z794 Long term (current) use of insulin: Secondary | ICD-10-CM

## 2012-11-18 LAB — URINE MICROSCOPIC-ADD ON

## 2012-11-18 LAB — URINALYSIS, ROUTINE W REFLEX MICROSCOPIC
Bilirubin Urine: NEGATIVE
Hgb urine dipstick: NEGATIVE
Specific Gravity, Urine: 1.013 (ref 1.005–1.030)
Urobilinogen, UA: 0.2 mg/dL (ref 0.0–1.0)
pH: 7 (ref 5.0–8.0)

## 2012-11-18 LAB — RAPID URINE DRUG SCREEN, HOSP PERFORMED
Amphetamines: NOT DETECTED
Benzodiazepines: NOT DETECTED
Opiates: NOT DETECTED

## 2012-11-18 LAB — CBC WITH DIFFERENTIAL/PLATELET
Basophils Absolute: 0 10*3/uL (ref 0.0–0.1)
HCT: 35.6 % — ABNORMAL LOW (ref 36.0–46.0)
Hemoglobin: 11.9 g/dL — ABNORMAL LOW (ref 12.0–15.0)
Lymphocytes Relative: 21 % (ref 12–46)
Lymphs Abs: 1 10*3/uL (ref 0.7–4.0)
Monocytes Absolute: 0.6 10*3/uL (ref 0.1–1.0)
Monocytes Relative: 12 % (ref 3–12)
Neutro Abs: 3.2 10*3/uL (ref 1.7–7.7)
RBC: 3.9 MIL/uL (ref 3.87–5.11)
WBC: 4.8 10*3/uL (ref 4.0–10.5)

## 2012-11-18 LAB — COMPREHENSIVE METABOLIC PANEL
AST: 14 U/L (ref 0–37)
BUN: 18 mg/dL (ref 6–23)
CO2: 29 mEq/L (ref 19–32)
Chloride: 97 mEq/L (ref 96–112)
Creatinine, Ser: 0.9 mg/dL (ref 0.50–1.10)
GFR calc non Af Amer: 63 mL/min — ABNORMAL LOW (ref >90)
Glucose, Bld: 299 mg/dL — ABNORMAL HIGH (ref 70–99)
Total Bilirubin: 0.2 mg/dL — ABNORMAL LOW (ref 0.3–1.2)

## 2012-11-18 LAB — GLUCOSE, CAPILLARY
Glucose-Capillary: 169 mg/dL — ABNORMAL HIGH (ref 70–99)
Glucose-Capillary: 131 mg/dL — ABNORMAL HIGH (ref 70–99)
Glucose-Capillary: 94 mg/dL (ref 70–99)

## 2012-11-18 LAB — PROTIME-INR: INR: 1.07 (ref 0.00–1.49)

## 2012-11-18 LAB — ETHANOL: Alcohol, Ethyl (B): <11 mg/dL (ref 0–11)

## 2012-11-18 MED ORDER — DEXTROSE 5 % IV SOLN
1.0000 g | Freq: Once | INTRAVENOUS | Status: AC
  Administered 2012-11-18: 1 g via INTRAVENOUS
  Filled 2012-11-18: qty 10

## 2012-11-18 MED ORDER — SODIUM CHLORIDE 0.9 % IJ SOLN
3.0000 mL | Freq: Two times a day (BID) | INTRAMUSCULAR | Status: DC
  Administered 2012-11-20 – 2012-11-21 (×2): 3 mL via INTRAVENOUS

## 2012-11-18 MED ORDER — ALUM & MAG HYDROXIDE-SIMETH 200-200-20 MG/5ML PO SUSP: 30.0000 mL | Freq: Four times a day (QID) | ORAL | Status: DC | PRN

## 2012-11-18 MED ORDER — SODIUM CHLORIDE 0.9 % IV SOLN
Freq: Once | INTRAVENOUS | Status: AC
  Administered 2012-11-18: 12:00:00 via INTRAVENOUS

## 2012-11-18 MED ORDER — DEXTROSE 50 % IV SOLN
INTRAVENOUS | Status: AC
  Filled 2012-11-18: qty 50

## 2012-11-18 MED ORDER — ONDANSETRON HCL 4 MG/2ML IJ SOLN: 4.0000 mg | Freq: Four times a day (QID) | INTRAMUSCULAR | Status: DC | PRN

## 2012-11-18 MED ORDER — DEXTROSE 5 % IV SOLN
1.0000 g | INTRAVENOUS | Status: DC
  Administered 2012-11-18 – 2012-11-20 (×3): 1 g via INTRAVENOUS
  Filled 2012-11-18 (×4): qty 10

## 2012-11-18 MED ORDER — ONDANSETRON HCL 4 MG PO TABS: 4.0000 mg | ORAL_TABLET | Freq: Four times a day (QID) | ORAL | Status: DC | PRN

## 2012-11-18 MED ORDER — DILTIAZEM HCL ER COATED BEADS 240 MG PO CP24
240.0000 mg | ORAL_CAPSULE | Freq: Every day | ORAL | Status: DC
  Administered 2012-11-19 – 2012-11-21 (×3): 240 mg via ORAL
  Filled 2012-11-18 (×4): qty 1

## 2012-11-18 MED ORDER — ACETAMINOPHEN 325 MG PO TABS: 650.0000 mg | ORAL_TABLET | Freq: Four times a day (QID) | ORAL | Status: DC | PRN

## 2012-11-18 MED ORDER — ACETAMINOPHEN 650 MG RE SUPP: 650.0000 mg | Freq: Four times a day (QID) | RECTAL | Status: DC | PRN

## 2012-11-18 MED ORDER — SODIUM CHLORIDE 0.9 % IV SOLN
INTRAVENOUS | Status: DC
  Administered 2012-11-19: 12:00:00 via INTRAVENOUS
  Administered 2012-11-19: 1000 mL via INTRAVENOUS
  Administered 2012-11-20: 10 mL/h via INTRAVENOUS

## 2012-11-18 MED ORDER — INSULIN ASPART 100 UNIT/ML ~~LOC~~ SOLN
0.0000 [IU] | Freq: Three times a day (TID) | SUBCUTANEOUS | Status: DC
  Administered 2012-11-19: 2 [IU] via SUBCUTANEOUS
  Administered 2012-11-19: 5 [IU] via SUBCUTANEOUS
  Administered 2012-11-20: 2 [IU] via SUBCUTANEOUS
  Administered 2012-11-20: 3 [IU] via SUBCUTANEOUS
  Administered 2012-11-20 – 2012-11-21 (×3): 2 [IU] via SUBCUTANEOUS

## 2012-11-18 MED ORDER — SODIUM CHLORIDE 0.9 % IV SOLN: INTRAVENOUS | Status: DC

## 2012-11-18 NOTE — ED Notes (Signed)
[facility]link--spoke with Marcello Moores

## 2012-11-18 NOTE — ED Provider Notes (Addendum)
CSN: [id number]     Arrival date & time 11/18/12  1005 History   First MD Initiated Contact with Patient 11/18/12 1006     Chief Complaint  Patient presents with  . Altered Mental Status  . Fatigue   (Consider location/radiation/quality/duration/timing/severity/associated sxs/prior Treatment) HPI Patient presents with her husband for several days of altered mental status. Sensation she she was recently lethargic today which is the reason for her presentation. Patient is on several sedating medications including Ativan which he takes for anxiety and Ambien which she takes for sleep. She is drowsy and history is limited due to her altered mental status. Husband states there's been no recent changes in her medications. He denies any head or neck trauma. He does state that the patient has had decreased mobility worsening in the last few days. He states that her right lower extremity has felt heavy she's had difficulty with balance. The patient denies taking any excess medication or alcohol consumption. Patient had a right hip fracture back in May which was repaired.  Past Medical History  Diagnosis Date  . Blindness of left eye   . Eye muscle weakness     Right eye weakness after cataract surgery  . Common migraine     ?hx of  . DIABETES MELLITUS, TYPE II 03/27/2006  . HYPERLIPIDEMIA 03/27/2006  . 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 HYPERTENSION 03/27/2006  . Atrial fibrillation     SVT dx 2007, cath 2007 mild CAD, then had an cardioversion, ablation; still on coumadin , has occ palpitation, EKG 03-2010 NSR  . Headache(784.0) 05/07/2009  . LUNG NODULE 09/01/2006    Excision, Bx Benighn  . Osteopenia 2004    Dexa 2004 showed Osteopenia, DEXA 03/2007 normal  . Seizures   . Intertrochanteric fracture of right hip 07/13/2012  . Herpes encephalitis    Past Surgical History  Procedure Laterality Date  . Shoulder surgery    . Femur im nail Right 07/15/2012    Procedure: INTRAMEDULLARY (IM) NAIL  HIP;  Surgeon: Johnny Bridge, MD;  Location: [facility];  Service: Orthopedics;  Laterality: Right;   Family History  Problem Relation Age of Onset  . Diabetes Father   . Heart attack Father 34  . Diabetes Sister   . Cancer Neg Hx     no hx of colon or breast cancer   History  Substance Use Topics  . Smoking status: Former Smoker    Types: Cigarettes    Quit date: 03/02/1978  . Smokeless tobacco: Never Used  . Alcohol Use: No   OB History   Grav Para Term Preterm Abortions TAB SAB Ect Mult Living                 Review of Systems  Constitutional: Positive for fatigue. Negative for fever and chills.  HENT: Negative for neck pain and neck stiffness.   Respiratory: Negative for shortness of breath.   Cardiovascular: Negative for chest pain and leg swelling.  Gastrointestinal: Negative for nausea, vomiting, abdominal pain and diarrhea.  Musculoskeletal: Negative for myalgias, back pain and arthralgias.  Skin: Negative for rash.  Neurological: Positive for weakness. Negative for syncope, light-headedness and headaches.  Psychiatric/Behavioral: Negative for self-injury. The patient is not nervous/anxious.   All other systems reviewed and are negative.    Allergies  Procaine hcl  Home Medications   Current Outpatient Rx  Name  Route  Sig  Dispense  Refill  . albuterol (VENTOLIN HFA) 108 (90 BASE) MCG/ACT inhaler  Inhalation   Inhale 2 puffs into the lungs every 6 (six) hours as needed for wheezing.   1 Inhaler   1   . aspirin EC 81 MG tablet   Oral   Take 81 mg by mouth once a week.          . calcium-vitamin D (OSCAL WITH D) 500-200 MG-UNIT per tablet   Oral   Take 1 tablet by mouth daily.         . colchicine 0.6 MG tablet   Oral   Take 1 tablet (0.6 mg total) by mouth 2 (two) times daily.   60 tablet   0     , after 10 days take colchicine as needed for toe  ...   . diltiazem (CARDIZEM CD) 240 MG 24 hr capsule   Oral   Take 1 capsule (240 mg total) by  mouth daily.   90 capsule   1   . docusate sodium 100 MG CAPS   Oral   Take 100 mg by mouth 2 (two) times daily.   10 capsule   0   . insulin NPH-regular (HUMULIN 70/30) (70-30) 100 UNIT/ML injection   Subcutaneous   Inject 35 Units into the skin daily with breakfast.   10 mL   3   . LORazepam (ATIVAN) 0.5 MG tablet   Oral   Take 1 tablet (0.5 mg total) by mouth 2 (two) times daily as needed for anxiety.   60 tablet   1   . metoprolol tartrate (LOPRESSOR) 25 MG tablet   Oral   Take 0.5 tablets (12.5 mg total) by mouth 2 (two) times daily.   90 tablet   1   . Multiple Vitamin (MULTIVITAMIN WITH MINERALS) TABS   Oral   Take 1 tablet by mouth daily.         . phenytoin (DILANTIN) 100 MG ER capsule   Oral   Take 2 capsules (200 mg total) by mouth 3 (three) times daily.   90 capsule   6   . pravastatin (PRAVACHOL) 40 MG tablet      TAKE ONE TABLET BY MOUTH EVERY DAY   30 tablet   5   . EXPIRED: ranitidine (ZANTAC) 300 MG tablet   Oral   Take 1 tablet (300 mg total) by mouth at bedtime.   30 tablet   3   . sennosides-docusate sodium (SENOKOT-S) 8.6-50 MG tablet   Oral   Take 2 tablets by mouth daily.   30 tablet   1   . warfarin (COUMADIN) 2.5 MG tablet   Oral   Take 1 tablet (2.5 mg total) by mouth daily.   40 tablet   1   . zolpidem (AMBIEN) 5 MG tablet   Oral   Take 1 tablet (5 mg total) by mouth at bedtime as needed for sleep.   30 tablet   2    BP 156/47  Pulse 53  Temp(Src) 97.4 F (36.3 C) (Oral)  Resp 14  SpO2 100% Physical Exam  Nursing note and vitals reviewed. Constitutional: She appears well-developed and well-nourished.  Patient is sleeping but arouses with physical stimulation.  HENT:  Head: Normocephalic and atraumatic.  Mouth/Throat: Oropharynx is clear and moist.  Eyes: EOM are normal. Pupils are equal, round, and reactive to light.  Neck: Normal range of motion. Neck supple.  No nuchal rigidity  Cardiovascular: Normal  rate and regular rhythm.   Pulmonary/Chest: Effort normal and breath sounds normal. No respiratory  distress. She has no wheezes. She has no rales. She exhibits no tenderness.  Abdominal: Soft. Bowel sounds are normal. She exhibits no distension and no mass. There is no tenderness. There is no rebound and no guarding.  Musculoskeletal: Normal range of motion. She exhibits no edema and no tenderness.  Neurological:  Patient is oriented only to self. She is slurring her words. Sensation appears to be grossly intact. she has bilateral normal grip strength. Questionable right lower extremity weakness compared to left exam is limited by effort  Skin: Skin is warm and dry. No rash noted. No erythema.  Psychiatric: She has a normal mood and affect. Her behavior is normal.    ED Course  Procedures (including critical care time) Labs Review Labs Reviewed  GLUCOSE, CAPILLARY - Abnormal; Notable for the following:    Glucose-Capillary 261 (*)    All other components within normal limits  CBC WITH DIFFERENTIAL - Abnormal; Notable for the following:    Hemoglobin 11.9 (*)    HCT 35.6 (*)    All other components within normal limits  COMPREHENSIVE METABOLIC PANEL - Abnormal; Notable for the following:    Glucose, Bld 299 (*)    Albumin 3.4 (*)    Alkaline Phosphatase 192 (*)    Total Bilirubin 0.2 (*)    GFR calc non Af Amer 63 (*)    GFR calc Af Amer 73 (*)    All other components within normal limits  URINALYSIS, ROUTINE W REFLEX MICROSCOPIC - Abnormal; Notable for the following:    APPearance CLOUDY (*)    Glucose, UA 500 (*)    Nitrite POSITIVE (*)    Leukocytes, UA LARGE (*)    All other components within normal limits  PHENYTOIN LEVEL, TOTAL - Abnormal; Notable for the following:    Phenytoin Lvl 35.4 (*)    All other components within normal limits  URINE MICROSCOPIC-ADD ON - Abnormal; Notable for the following:    Bacteria, UA FEW (*)    All other components within normal limits   URINE CULTURE  PROTIME-INR  TROPONIN I  ETHANOL  URINE RAPID DRUG SCREEN (HOSP PERFORMED)  APTT   Imaging Review Ct Head Wo Contrast  11/18/2012   CLINICAL DATA:  Altered mental status for 1 week.  EXAM: CT HEAD WITHOUT CONTRAST  TECHNIQUE: Contiguous axial images were obtained from the base of the skull through the vertex without intravenous contrast.  COMPARISON:  Head CT scan 07/13/2012.  FINDINGS: Extensive encephalomalacia left temporal lobe is unchanged. Chronic microvascular ischemic change is also again seen. No evidence of acute abnormality including infarction, hemorrhage, mass lesion, mass effect, midline shift or abnormal extra-axial fluid collection is identified. There is no hydrocephalus or pneumocephalus. The calvarium is intact.  IMPRESSION: No acute finding.  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   Electronically Signed   By: Inge Rise M.D.   On: 11/18/2012 11:17    Date: 11/18/2012  Rate: 55  Rhythm: sinus bradycardia  QRS Axis: normal  Intervals: PR prolonged  ST/T Wave abnormalities: normal  Conduction Disutrbances:none  Narrative Interpretation:   Old EKG Reviewed: unchanged    MDM  Patient is more alert through continues to be drowsy and slurring her words. Given the fact that her phenytoin level is elevated, she has a urinary tract infection and her persistent altered mental status, consults hospitalist transfer to [facility]  Discuss case with Dr. Tyrell Antonio. She requested that neurology be consulted. Will accept  patient as a transfer to [facility]. The patient is protecting her airway and is medically stable transfer  Julianne Rice, MD 11/18/12 1255  Dr Nicole Kindred is aware of the patient is being transferred he'll see the patient in consult.  Julianne Rice, MD 11/18/12 1258

## 2012-11-18 NOTE — ED Notes (Signed)
Per family, pt has been lethargic and had generalized weakness x 1 week.  Pt is s/p hip surgery (May) and had decreased mobility since surgery.

## 2012-11-18 NOTE — Progress Notes (Signed)
Text page to Dr. Tyrell Antonio to inform her of  Admission to room 5w-25, with a dx. Of AMS/UTI.

## 2012-11-18 NOTE — Progress Notes (Signed)
Call received from Dr. Hartford Poli, informed of patient condition. New order received.  Rapid response nurse into see patient, placed on bedside monitor, O2.  Dr. Nicole Kindred into see patient per request, Stat Ct scan ordered.

## 2012-11-18 NOTE — ED Notes (Signed)
Phenytoin EX 100mg  TID is prescribed.  Medication bottle- filled on 10/24/2012 #90 and medication bottle is empty.

## 2012-11-18 NOTE — Progress Notes (Signed)
Patient to CT scan via bed, with monitor, O2 and rapid response nurse.

## 2012-11-18 NOTE — Progress Notes (Signed)
Transferred patient to 2C08.

## 2012-11-18 NOTE — Progress Notes (Signed)
Unable to give PO Cardizem d/t pt lethargic.  Fredirick Maudlin, NP notified.

## 2012-11-18 NOTE — ED Notes (Signed)
MD at bedside. 

## 2012-11-18 NOTE — Progress Notes (Signed)
Patient received in bed from [address] [place], accompanied by husband, resting VSS, arousable, oriented X4. No acute distress noted at this time will continue to monitor.   [name] [name]

## 2012-11-18 NOTE — Consult Note (Signed)
Reason for Consult: Altered mental status.  HPI:                                                                                                                                          [name] [name] is an 72 y.o. female with a history of diabetes mellitus, hypertension, hyperlipidemia, atrial fibrillation, herpes encephalitis and seizure disorder, was brought to the emergency room at [facility] for evaluation of increasing lethargy over the past couple of days. She was noted to have slurred speech and was confused in addition to being very drowsy, but arousable. CT scan of her head showed encephalomalacia and may left temporal region as well as small vessel white matter ischemic changes, but no acute intracranial abnormality. Patient has been on Dilantin 150 mg 3 times a day. Dilantin level was 35.4, corrected to 45.4. She also had findings indicative of acute urinary tract infection. She was transferred to [facility] for further management. Patient reportedly was conversant on arrival but has subsequently become unresponsive, including with noxious stimuli.  Past Medical History  Diagnosis Date  . Blindness of left eye   . Eye muscle weakness     Right eye weakness after cataract surgery  . Common migraine     ?hx of  . DIABETES MELLITUS, TYPE II 03/27/2006  . HYPERLIPIDEMIA 03/27/2006  . 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 HYPERTENSION 03/27/2006  . Atrial fibrillation     SVT dx 2007, cath 2007 mild CAD, then had an cardioversion, ablation; still on coumadin , has occ palpitation, EKG 03-2010 NSR  . Headache(784.0) 05/07/2009  . LUNG NODULE 09/01/2006    Excision, Bx Benighn  . Osteopenia 2004    Dexa 2004 showed Osteopenia, DEXA 03/2007 normal  . Seizures   . Intertrochanteric fracture of right hip 07/13/2012  . Herpes encephalitis     Past Surgical History  Procedure Laterality Date  . Shoulder surgery    . Femur im nail Right 07/15/2012    Procedure: INTRAMEDULLARY (IM) NAIL HIP;  Surgeon:  Johnny Bridge, MD;  Location: [facility];  Service: Orthopedics;  Laterality: Right;    Family History  Problem Relation Age of Onset  . Diabetes Father   . Heart attack Father 3  . Diabetes Sister   . Cancer Neg Hx     no hx of colon or breast cancer    Social History:  reports that she quit smoking about 34 years ago. Her smoking use included Cigarettes. She smoked 0.00 packs per day. She has never used smokeless tobacco. She reports that she does not drink alcohol or use illicit drugs.  Allergies  Allergen Reactions  . Procaine Hcl Anaphylaxis    MEDICATIONS:  I have reviewed the patient's current medications.   ROS:                                                                                                                                       History obtained from unobtainable from patient due to mental status  Blood pressure 129/40, pulse 52, temperature 97.3 F (36.3 C), temperature source Axillary, resp. rate 18, height 5\' 8"  (1.727 m), weight 87.4 kg (192 lb 10.9 oz), SpO2 100.00%.   Neurologic Examination:                                                                                                      Patient was unresponsive including to deep pain. Respirations were normal and regular. Pupils were equal and appear to react somewhat sluggishly to light. Extraocular movements were intact with oculocephalic maneuvers. No focal facial weakness was noted. Gag reflex was intact. Muscle tone was flaccid throughout. Patient had no spontaneous movements nor abnormal posturing. She had no withdrawal movements to noxious stimuli. Deep tendon reflexes were absent throughout. Plantar responses were mute bilaterally.  Lab Results  Component Value Date/Time   CHOL 176 04/05/2012 11:09 AM    Results for orders placed during the hospital encounter of 11/18/12  (from the past 48 hour(s))  GLUCOSE, CAPILLARY     Status: Abnormal   Collection Time    11/18/12 10:22 AM      Result Value Range   Glucose-Capillary 261 (*) 70 - 99 mg/dL  CBC WITH DIFFERENTIAL     Status: Abnormal   Collection Time    11/18/12 10:25 AM      Result Value Range   WBC 4.8  4.0 - 10.5 K/uL   RBC 3.90  3.87 - 5.11 MIL/uL   Hemoglobin 11.9 (*) 12.0 - 15.0 g/dL   HCT 35.6 (*) 36.0 - 46.0 %   MCV 91.3  78.0 - 100.0 fL   MCH 30.5  26.0 - 34.0 pg   MCHC 33.4  30.0 - 36.0 g/dL   RDW 12.5  11.5 - 15.5 %   Platelets 277  150 - 400 K/uL   Neutrophils Relative % 65  43 - 77 %   Neutro Abs 3.2  1.7 - 7.7 K/uL   Lymphocytes Relative 21  12 - 46 %   Lymphs Abs 1.0  0.7 - 4.0 K/uL   Monocytes Relative 12  3 - 12 %   Monocytes Absolute 0.6  0.1 - 1.0  K/uL   Eosinophils Relative 1  0 - 5 %   Eosinophils Absolute 0.1  0.0 - 0.7 K/uL   Basophils Relative 0  0 - 1 %   Basophils Absolute 0.0  0.0 - 0.1 K/uL  COMPREHENSIVE METABOLIC PANEL     Status: Abnormal   Collection Time    11/18/12 10:25 AM      Result Value Range   Sodium 135  135 - 145 mEq/L   Potassium 4.1  3.5 - 5.1 mEq/L   Chloride 97  96 - 112 mEq/L   CO2 29  19 - 32 mEq/L   Glucose, Bld 299 (*) 70 - 99 mg/dL   BUN 18  6 - 23 mg/dL   Creatinine, Ser 0.90  0.50 - 1.10 mg/dL   Calcium 9.4  8.4 - 10.5 mg/dL   Total Protein 6.8  6.0 - 8.3 g/dL   Albumin 3.4 (*) 3.5 - 5.2 g/dL   AST 14  0 - 37 U/L   ALT 11  0 - 35 U/L   Alkaline Phosphatase 192 (*) 39 - 117 U/L   Total Bilirubin 0.2 (*) 0.3 - 1.2 mg/dL   GFR calc non Af Amer 63 (*) >90 mL/min   GFR calc Af Amer 73 (*) >90 mL/min   Comment: (NOTE)     The eGFR has been calculated using the CKD EPI equation.     This calculation has not been validated in all clinical situations.     eGFR's persistently <90 mL/min signify possible Chronic Kidney     Disease.  PROTIME-INR     Status: None   Collection Time    11/18/12 10:25 AM      Result Value Range    Prothrombin Time 13.7  11.6 - 15.2 seconds   INR 1.07  0.00 - 1.49  TROPONIN I     Status: None   Collection Time    11/18/12 10:25 AM      Result Value Range   Troponin I <0.30  <0.30 ng/mL   Comment:            Due to the release kinetics of cTnI,     a negative result within the first hours     of the onset of symptoms does not rule out     myocardial infarction with certainty.     If myocardial infarction is still suspected,     repeat the test at appropriate intervals.  PHENYTOIN LEVEL, TOTAL     Status: Abnormal   Collection Time    11/18/12 10:25 AM      Result Value Range   Phenytoin Lvl 35.4 (*) 10.0 - 20.0 ug/mL   Comment: RESULTS CONFIRMED BY MANUAL DILUTION     CRITICAL RESULT CALLED TO, READ BACK BY AND VERIFIED WITH:     CALLED TO L.DOSS RN AT 11:59 ON 11/18/12 BY S.ROY  ETHANOL     Status: None   Collection Time    11/18/12 10:25 AM      Result Value Range   Alcohol, Ethyl (B) <11  0 - 11 mg/dL   Comment:            LOWEST DETECTABLE LIMIT FOR     SERUM ALCOHOL IS 11 mg/dL     FOR MEDICAL PURPOSES ONLY  APTT     Status: None   Collection Time    11/18/12 10:25 AM      Result Value Range   aPTT 29  24 - 37 seconds  URINALYSIS, ROUTINE W REFLEX MICROSCOPIC     Status: Abnormal   Collection Time    11/18/12 11:15 AM      Result Value Range   Color, Urine YELLOW  YELLOW   APPearance CLOUDY (*) CLEAR   Specific Gravity, Urine 1.013  1.005 - 1.030   pH 7.0  5.0 - 8.0   Glucose, UA 500 (*) NEGATIVE mg/dL   Hgb urine dipstick NEGATIVE  NEGATIVE   Bilirubin Urine NEGATIVE  NEGATIVE   Ketones, ur NEGATIVE  NEGATIVE mg/dL   Protein, ur NEGATIVE  NEGATIVE mg/dL   Urobilinogen, UA 0.2  0.0 - 1.0 mg/dL   Nitrite POSITIVE (*) NEGATIVE   Leukocytes, UA LARGE (*) NEGATIVE  URINE RAPID DRUG SCREEN (HOSP PERFORMED)     Status: None   Collection Time    11/18/12 11:15 AM      Result Value Range   Opiates NONE DETECTED  NONE DETECTED   Cocaine NONE DETECTED  NONE  DETECTED   Benzodiazepines NONE DETECTED  NONE DETECTED   Amphetamines NONE DETECTED  NONE DETECTED   Tetrahydrocannabinol NONE DETECTED  NONE DETECTED   Barbiturates NONE DETECTED  NONE DETECTED   Comment:            DRUG SCREEN FOR MEDICAL PURPOSES     ONLY.  IF CONFIRMATION IS NEEDED     FOR ANY PURPOSE, NOTIFY LAB     WITHIN 5 DAYS.                LOWEST DETECTABLE LIMITS     FOR URINE DRUG SCREEN     Drug Class       Cutoff (ng/mL)     Amphetamine      1000     Barbiturate      200     Benzodiazepine   [id number]     Tricyclics       [id number]     Opiates          300     Cocaine          300     THC              50  URINE MICROSCOPIC-ADD ON     Status: Abnormal   Collection Time    11/18/12 11:15 AM      Result Value Range   Squamous Epithelial / LPF RARE  RARE   WBC, UA 21-50  <3 WBC/hpf   RBC / HPF 3-6  <3 RBC/hpf   Bacteria, UA FEW (*) RARE   Urine-Other MUCOUS PRESENT    GLUCOSE, CAPILLARY     Status: Abnormal   Collection Time    11/18/12  4:05 PM      Result Value Range   Glucose-Capillary 128 (*) 70 - 99 mg/dL  GLUCOSE, CAPILLARY     Status: Abnormal   Collection Time    11/18/12  6:09 PM      Result Value Range   Glucose-Capillary 47 (*) 70 - 99 mg/dL  GLUCOSE, CAPILLARY     Status: Abnormal   Collection Time    11/18/12  6:17 PM      Result Value Range   Glucose-Capillary 222 (*) 70 - 99 mg/dL  GLUCOSE, CAPILLARY     Status: Abnormal   Collection Time    11/18/12  6:26 PM      Result Value Range   Glucose-Capillary 169 (*) 70 - 99 mg/dL  Ct Head Wo Contrast  11/18/2012   CLINICAL DATA:  Altered mental status for 1 week.  EXAM: CT HEAD WITHOUT CONTRAST  TECHNIQUE: Contiguous axial images were obtained from the base of the skull through the vertex without intravenous contrast.  COMPARISON:  Head CT scan 07/13/2012.  FINDINGS: Extensive encephalomalacia left temporal lobe is unchanged. Chronic microvascular ischemic change is also again seen. No evidence of acute  abnormality including infarction, hemorrhage, mass lesion, mass effect, midline shift or abnormal extra-axial fluid collection is identified. There is no hydrocephalus or pneumocephalus. The calvarium is intact.  IMPRESSION: No acute finding.  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   Electronically Signed   By: Inge Rise M.D.   On: 11/18/2012 11:17    Assessment/Plan: 72 year old lady with altered mental status likely multifactorial in etiology, including acute urinary tract infection and possible urosepsis, as well as Dilantin toxicity. Etiology for sudden unresponsiveness is unclear. No seizure activity has been reported. No focal deficits were noted.  Recommendations: 1. Repeat CT scan of the head, STAT 2. Hold Dilantin and repeat Dilantin level in the a.m. 3. Management of urinary tract infection per primary care team. 4. Recommend consideration for transfer patient to step down unit for close to manage. 5. Should recurrent seizure activity occur would recommend acute management with Ativan IV along with Keppra 1000 mg loading dose and 500 mg every 12 hours. Recurrent seizure activity however is unlikely with Dilantin toxicity.  We will continue to follow this patient with you.  C.R. Nicole Kindred, MD [facility] [phone]  11/18/2012, 6:40 PM

## 2012-11-18 NOTE — Progress Notes (Signed)
Call into patient room by nursing student, patient slumped over, not responding verbally.  Blood sugar 47, one amp of D50 given IV per protocol, blood sugar up to 222.  Patient remain unresponsive, resp even and regular, blood pressure 129/40.  Rapid response call.

## 2012-11-18 NOTE — ED Notes (Signed)
[facility] has been notified of room 5w25c at [facility]

## 2012-11-18 NOTE — Progress Notes (Signed)
Patient presents with AMS, progressive. Per ED physician patient stable for telemetry. Mental status improving. I have asked to the ED to consult neuro.

## 2012-11-18 NOTE — Significant Event (Signed)
Rapid Response Event Note  Overview:  Called to see patient with sudden onset unresponsiveness.   Time Called: 1806 Arrival Time: 1815 Event Type: Neurologic  Initial Focused Assessment:  On arrival patient lying in bed - pale - cool and moist - Pupils 14mmERL - little sluggish - no response to  DPS - no abnormal muscle movement noted - dentures removed - some gag present - bil BS present = clear - resps shallow but regular and unlabored - BP HR stable - afib - has history.  CBG was 44 at 1806 with initial phone call to [facility] - treated with D50 PTA - now 169.  Abd soft.  Extremities flacid.  Dr. Nicole Kindred present.     Interventions:  HOB up for airway protection - placed on heart monitor - placed on O2.   12 lead EKG done.  Stat head CT ordered - Dr. Hartford Poli in.  To CT scan.  In CT room patient opened eyes to name.  Tol scan.  Back to [facility] - patient more responsive.  Arouses - very quiet voice asking for water.  Able to cough weakly.  Follows some commands.  BP remains stable.  CBG now 94.  Staff to monitor freqeuntly.   See doc flowsheets for details.  For transfer to SDU for closer monitoring.  Handoff to [employer].     Event Summary: Name of Physician Notified: Dr. Hartford Poli at 1800  Name of Consulting Physician Notified: Dr. Nicole Kindred at    Outcome: Transferred (Comment)  Event End Time: [id number]  Quin Hoop

## 2012-11-18 NOTE — H&P (Signed)
[facility] History and Physical  [name] [name] [id number] DOB: 1940-07-12 DOA: 11/18/2012  Referring physician: Julianne Rice, MD PCP: [name], MD  Specialists:   Chief Complaint: Altered mental status  HPI: [name] [name] is a 72 y.o. 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 Patient brought to the emergency department because of worsening sleepiness and altered mental status. I evaluated the patient and she mentioned since she had the "brain issue" she does not remember very well, but today she was told that she had more forgetfulness, confusion and lethargy. Initial evaluation in the emergency department showed Dilantin level of 35.4 corrected to 45.4 per neurology. Her urinalysis consistent with UTI. Patient had an episode of unresponsiveness after admission, subsequently transferred to step down unit.  Review of Systems:  Unable to obtain secondary to confusion  Past Medical History  Diagnosis Date  . Blindness of left eye   . Eye muscle weakness     Right eye weakness after cataract surgery  . Common migraine     ?hx of  . DIABETES MELLITUS, TYPE II 03/27/2006  . HYPERLIPIDEMIA 03/27/2006  . 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 HYPERTENSION 03/27/2006  . Atrial fibrillation     SVT dx 2007, cath 2007 mild CAD, then had an cardioversion, ablation; still on coumadin , has occ palpitation, EKG 03-2010 NSR  . Headache(784.0) 05/07/2009  . LUNG NODULE 09/01/2006    Excision, Bx Benighn  . Osteopenia 2004    Dexa 2004 showed Osteopenia, DEXA 03/2007 normal  . Seizures   . Intertrochanteric fracture of right hip 07/13/2012  . 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 Shoulder surgery    . Femur im nail Right 07/15/2012    Procedure: INTRAMEDULLARY (IM) NAIL HIP;  Surgeon: Johnny Bridge, MD;  Location: [place];  Service: Orthopedics;   Laterality: Right;   Social History:  reports that she quit smoking about 34 years ago. Her smoking use included Cigarettes. She smoked 0.00 packs per day. She has never used smokeless tobacco. She reports that she does not drink alcohol or use illicit drugs.  Allergies  Allergen Reactions  . Procaine Hcl Anaphylaxis    Family History  Problem Relation Age of Onset  . Diabetes Father   . Heart attack Father 31  . Diabetes Sister   . Cancer Neg Hx     no hx of colon or breast cancer   Prior to Admission medications   Medication Sig Start Date End Date Taking? Authorizing Provider  albuterol (VENTOLIN HFA) 108 (90 BASE) MCG/ACT inhaler Inhale 2 puffs into the lungs every 6 (six) hours as needed for wheezing. 10/18/12  Yes Colon Branch, MD  aspirin EC 81 MG tablet Take 81 mg by mouth once a week.    Yes Historical Provider, MD  calcium-vitamin D (OSCAL WITH D) 500-200 MG-UNIT per tablet Take 1 tablet by mouth daily.   Yes Historical Provider, MD  colchicine 0.6 MG tablet Take 1 tablet (0.6 mg total) by mouth 2 (two) times daily. 09/15/12  Yes Colon Branch, MD  diltiazem (CARDIZEM CD) 240 MG 24 hr capsule Take 1 capsule (240 mg total) by mouth daily. 10/18/12  Yes Colon Branch, MD  docusate sodium 100 MG CAPS Take 100 mg by mouth 2 (two) times daily. 07/17/12  Yes Donne Hazel, MD  insulin NPH-regular (HUMULIN 70/30) (70-30) 100  UNIT/ML injection Inject 35 Units into the skin daily with breakfast. 06/03/12  Yes Renato Shin, MD  LORazepam (ATIVAN) 0.5 MG tablet Take 1 tablet (0.5 mg total) by mouth 2 (two) times daily as needed for anxiety. 10/18/12  Yes Colon Branch, MD  metoprolol tartrate (LOPRESSOR) 25 MG tablet Take 0.5 tablets (12.5 mg total) by mouth 2 (two) times daily. 10/18/12  Yes Colon Branch, MD  Multiple Vitamin (MULTIVITAMIN WITH MINERALS) TABS Take 1 tablet by mouth daily.   Yes Historical Provider, MD  phenytoin (DILANTIN) 100 MG ER capsule Take 100 mg by mouth 3 (three) times daily.  10/24/12  Yes Philmore Pali, NP  phenytoin (DILANTIN) 50 MG tablet Chew 50 mg by mouth 3 (three) times daily. Pt takes 50 mg with 100 mg to equal a total dose of 150 mg   Yes Historical Provider, MD  pravastatin (PRAVACHOL) 40 MG tablet TAKE ONE TABLET BY MOUTH EVERY DAY 09/21/12  Yes Colon Branch, MD  ranitidine (ZANTAC) 300 MG tablet Take 1 tablet (300 mg total) by mouth at bedtime. 10/05/11 11/18/12 Yes Colon Branch, MD  sennosides-docusate sodium (SENOKOT-S) 8.6-50 MG tablet Take 2 tablets by mouth daily. 07/15/12  Yes Johnny Bridge, MD  warfarin (COUMADIN) 2.5 MG tablet Take 1 tablet (2.5 mg total) by mouth daily. 09/22/12  Yes Evans Lance, MD  zolpidem (AMBIEN) 5 MG tablet Take 1 tablet (5 mg total) by mouth at bedtime as needed for sleep. 10/18/12  Yes Colon Branch, MD   Physical Exam: Filed Vitals:   11/18/12 1819  BP: 129/40  Pulse:   Temp: 97.3 F (36.3 C)  Resp: 18   General appearance: alert, cooperative and no distress  Head: Normocephalic, without obvious abnormality, atraumatic  Eyes: conjunctivae/corneas clear. PERRL, EOM's intact. Fundi benign.  Nose: Nares normal. Septum midline. Mucosa normal. No drainage or sinus tenderness.  Throat: lips, mucosa, and tongue normal; teeth and gums normal  Neck: Supple, no masses, no cervical lymphadenopathy, no JVD appreciated, no meningeal signs Resp: clear to auscultation bilaterally  Chest wall: no tenderness  Cardio: regular rate and rhythm, S1, S2 normal, no murmur, click, rub or gallop  GI: soft, non-tender; bowel sounds normal; no masses, no organomegaly  Extremities: extremities normal, atraumatic, no cyanosis or edema  Skin: Skin color, texture, turgor normal. No rashes or lesions  Neurologic: Alert and awake, slightly confused.   Labs on Admission:  Basic Metabolic Panel:  Recent Labs Lab 11/18/12 1025  NA 135  K 4.1  CL 97  CO2 29  GLUCOSE 299*  BUN 18  CREATININE 0.90  CALCIUM 9.4   Liver Function Tests:  Recent  Labs Lab 11/18/12 1025  AST 14  ALT 11  ALKPHOS 192*  BILITOT 0.2*  PROT 6.8  ALBUMIN 3.4*   No results found for this basename: LIPASE, AMYLASE,  in the last 168 hours No results found for this basename: AMMONIA,  in the last 168 hours CBC:  Recent Labs Lab 11/18/12 1025  WBC 4.8  NEUTROABS 3.2  HGB 11.9*  HCT 35.6*  MCV 91.3  PLT 277   Cardiac Enzymes:  Recent Labs Lab 11/18/12 1025  TROPONINI <0.30    BNP (last 3 results) No results found for this basename: PROBNP,  in the last 8760 hours CBG:  Recent Labs Lab 11/18/12 1605 11/18/12 1809 11/18/12 1817 11/18/12 1826 11/18/12 1843  GLUCAP 128* 47* 222* 169* 131*    Radiological Exams on Admission: Ct  Head Wo Contrast  11/18/2012   CLINICAL DATA:  Altered mental status for 1 week.  EXAM: CT HEAD WITHOUT CONTRAST  TECHNIQUE: Contiguous axial images were obtained from the base of the skull through the vertex without intravenous contrast.  COMPARISON:  Head CT scan 07/13/2012.  FINDINGS: Extensive encephalomalacia left temporal lobe is unchanged. Chronic microvascular ischemic change is also again seen. No evidence of acute abnormality including infarction, hemorrhage, mass lesion, mass effect, midline shift or abnormal extra-axial fluid collection is identified. There is no hydrocephalus or pneumocephalus. The calvarium is intact.  IMPRESSION: No acute finding.  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   Electronically Signed   By: Inge Rise M.D.   On: 11/18/2012 11:17    EKG: Independently reviewed.   Assessment/Plan Principal Problem:   Acute encephalopathy Active Problems:   HYPERTENSION   Seizure disorder   DM (diabetes mellitus)   UTI (urinary tract infection)   History of encephalitis    Acute encephalopathy -Lethargy and increased confusion waxing and waning, unclear etiology. -Differential diagnoses include acute encephalopathy  secondary to UTI, Dilantin toxicity versus seizure. -Patient had an episode of unresponsiveness in the hospital, transferred to step down. -Check CT scan, hold Dilantin and treat UTI.  Unresponsive episode -Per RN nurse was unresponsive her CBG was 44 she was giving him both D50 with blood sugar recovered to 220. -Patient did not regain any consciousness after blood sugar normalized. -When I checked on her, she has minimal response to painful stimuli, but improved since she was not responding at all. -And clear etiology as mentioned above Dilantin toxicity versus seizure versus UTI.  Dilantin toxicity -Corrected Dilantin level is over 45. -Discussed with Dr. Nicole Kindred of neurology, hold Dilantin check Dilantin level in a.m.  UTI -Started on Rocephin empirically. -Adjust antibiotics according to culture results.  Diabetes mellitus type 2 -Hold her home insulin, started on insulin sliding scale. -Currently n.p.o., when she is awake and alert, diet can be advanced to carbohydrate modified diet, check Hb A1c.  Code Status: Full code Family Communication: No family members at bedside Disposition Plan: Stepdown  Time spent: 63 minutes  [facility] Pager [phone]  If 7PM-7AM, please contact night-coverage [email or web address] Password [email or web address] 11/18/2012, 7:01 PM

## 2012-11-18 NOTE — ED Notes (Signed)
Patient transported to CT 

## 2012-11-18 NOTE — Progress Notes (Signed)
Into see patient on rounds, patient upset,  have not eaten since 0800 am this morning, requesting something to eat.  I informed patient she was NPO, however i will call the doctor to see if she can eat.

## 2012-11-19 LAB — BASIC METABOLIC PANEL
BUN: 16 mg/dL (ref 6–23)
CO2: 26 mEq/L (ref 19–32)
Chloride: 100 mEq/L (ref 96–112)
Creatinine, Ser: 0.84 mg/dL (ref 0.50–1.10)
GFR calc Af Amer: 80 mL/min — ABNORMAL LOW (ref >90)
Potassium: 3.4 mEq/L — ABNORMAL LOW (ref 3.5–5.1)
Sodium: 136 mEq/L (ref 135–145)

## 2012-11-19 LAB — GLUCOSE, CAPILLARY
Glucose-Capillary: 81 mg/dL (ref 70–99)
Glucose-Capillary: 96 mg/dL (ref 70–99)
Glucose-Capillary: 185 mg/dL — ABNORMAL HIGH (ref 70–99)
Glucose-Capillary: 300 mg/dL — ABNORMAL HIGH (ref 70–99)
Glucose-Capillary: 191 mg/dL — ABNORMAL HIGH (ref 70–99)

## 2012-11-19 LAB — CBC
HCT: 36.9 % (ref 36.0–46.0)
Hemoglobin: 12.5 g/dL (ref 12.0–15.0)
MCHC: 33.9 g/dL (ref 30.0–36.0)
MCV: 90.2 fL (ref 78.0–100.0)
RDW: 13.1 % (ref 11.5–15.5)
WBC: 6.5 10*3/uL (ref 4.0–10.5)

## 2012-11-19 LAB — TSH: TSH: 2.59 u[IU]/mL (ref 0.350–4.500)

## 2012-11-19 LAB — PHENYTOIN LEVEL, TOTAL: Phenytoin Lvl: 38.9 ug/mL (ref 10.0–20.0)

## 2012-11-19 LAB — HEMOGLOBIN A1C: Mean Plasma Glucose: 177 mg/dL — ABNORMAL HIGH (ref <117)

## 2012-11-19 LAB — MRSA PCR SCREENING: MRSA by PCR: NEGATIVE

## 2012-11-19 NOTE — Progress Notes (Signed)
Subjective: No recurrence of loss of consciousness. Mental status has improved. Patient is complaining of pain involving her left leg and hip. No seizure activity reported.  Objective: Current vital signs: BP 148/49  Pulse 68  Temp(Src) 98.3 F (36.8 C) (Oral)  Resp 13  Ht 5\' 8"  (1.727 m)  Wt 88.7 kg (195 lb 8.8 oz)  BMI 29.74 kg/m2  SpO2 100%  Neurologic Exam: Alert and in no acute distress. Patient is well-oriented to time as well as place. Pupils were equal and reacted normally to light. Extraocular movements were full and conjugate. Left homonymous hemianopsia was noted. No facial weakness noted. Speech was normal. Strength was normal and symmetrical throughout. Resting tremor of right upper extremity was noted.  Dilantin level from this morning is pending.  Medications: I have reviewed the patient's current medications.  Assessment/Plan: Altered mental status likely multifactorial including Dilantin toxicity as well as urinary tract infection. Patient has had no recurrence of unconsciousness. Mental status is markedly improved. Etiology for loss of consciousness is unclear, as no seizure activity was exhibited. Repeat CT scan of her head was unremarkable.  Recommend continuing to hold Dilantin until Dilantin level has returned to therapeutic range. Patient will be started lower dose of Dilantin at that point.  We will continue to follow this patient with you.  C.R. Nicole Kindred, MD [facility] [phone]  11/19/2012  9:54 AM

## 2012-11-19 NOTE — Progress Notes (Signed)
[facility] PROGRESS NOTE  [name] [name] [id number] DOB: 06-Aug-1940 DOA: 11/18/2012 PCP: [name], MD  HPI/Subjective: Seen with husband at bedside, fully awake and alert. Feels much better than yesterday.  Assessment/Plan: Principal Problem:   Acute encephalopathy Active Problems:   HYPERTENSION   Seizure disorder   DM (diabetes mellitus)   UTI (urinary tract infection)   History of encephalitis   Acute encephalopathy  -Lethargy and increased confusion waxing and waning, unclear etiology.  -Differential diagnoses include acute encephalopathy secondary to UTI, Dilantin toxicity versus seizure.  -CT scan from last night was negative, patient back to her normal self.  Unresponsive episode  -Per RN nurse was unresponsive her CBG was 44 she was giving him both D50 with blood sugar recovered to 220.  -Patient did not regain her consciousness after blood sugar normalized.  -Unclear etiology for unresponsive episode, this is resolved completely. -Transfer back to telemetry.  Dilantin toxicity  -Corrected Dilantin level is over 45.  -Discussed with Dr. Nicole Kindred of neurology, hold Dilantin check Dilantin level in a.m.   UTI  -Started on Rocephin empirically.  -Adjust antibiotics according to culture results.   Diabetes mellitus type 2  -Hold her home insulin, started on insulin sliding scale.  -Currently n.p.o., when she is awake and alert, diet can be advanced to carbohydrate modified diet, check Hb A1c.   Code Status: Full code Family Communication: Plan discussed with the patient. Disposition Plan: Remains inpatient   Consultants:  Neurology   Procedures:  None  Antibiotics:  Rocephin   Objective: Filed Vitals:   11/19/12 1557  BP:   Pulse:   Temp: 98.6 F (37 C)  Resp:     Intake/Output Summary (Last 24 hours) at 11/19/12 1559 Last data filed at 11/19/12 1500  Gross per 24 hour  Intake  462.5 ml  Output    300 ml  Net  162.5 ml    Filed Weights   11/18/12 1559 11/19/12 0439  Weight: 87.4 kg (192 lb 10.9 oz) 88.7 kg (195 lb 8.8 oz)    Exam: General: Alert and awake, oriented x3, not in any acute distress. HEENT: anicteric sclera, pupils reactive to light and accommodation, EOMI CVS: S1-S2 clear, no murmur rubs or gallops Chest: clear to auscultation bilaterally, no wheezing, rales or rhonchi Abdomen: soft nontender, nondistended, normal bowel sounds, no organomegaly Extremities: no cyanosis, clubbing or edema noted bilaterally Neuro: Cranial nerves II-XII intact, no focal neurological deficits  Data Reviewed: Basic Metabolic Panel:  Recent Labs Lab 11/18/12 1025 11/19/12 0555  NA 135 136  K 4.1 3.4*  CL 97 100  CO2 29 26  GLUCOSE 299* 86  BUN 18 16  CREATININE 0.90 0.84  CALCIUM 9.4 9.1   Liver Function Tests:  Recent Labs Lab 11/18/12 1025  AST 14  ALT 11  ALKPHOS 192*  BILITOT 0.2*  PROT 6.8  ALBUMIN 3.4*   No results found for this basename: LIPASE, AMYLASE,  in the last 168 hours No results found for this basename: AMMONIA,  in the last 168 hours CBC:  Recent Labs Lab 11/18/12 1025 11/19/12 0555  WBC 4.8 6.5  NEUTROABS 3.2  --   HGB 11.9* 12.5  HCT 35.6* 36.9  MCV 91.3 90.2  PLT 277 277   Cardiac Enzymes:  Recent Labs Lab 11/18/12 1025  TROPONINI <0.30   BNP (last 3 results) No results found for this basename: PROBNP,  in the last 8760 hours CBG:  Recent Labs Lab 11/18/12 1843 11/18/12 1918  11/18/12 2250 11/19/12 0834 11/19/12 1157  GLUCAP 131* 94 81 96 185*    Micro Recent Results (from the past 240 hour(s))  URINE CULTURE     Status: None   Collection Time    11/18/12 11:15 AM      Result Value Range Status   Specimen Description URINE, CATHETERIZED   Final   Special Requests NONE   Final   Culture  Setup Time     Final   Value: 11/18/2012 14:48     Performed at [facility] PENDING   Incomplete   Culture     Final    Value: Culture reincubated for better growth     Performed at [facility]   Report Status PENDING   Incomplete  MRSA PCR SCREENING     Status: None   Collection Time    11/18/12 10:49 PM      Result Value Range Status   MRSA by PCR NEGATIVE  NEGATIVE Final   Comment:            The GeneXpert MRSA Assay (FDA     approved for NASAL specimens     only), is one component of a     comprehensive MRSA colonization     surveillance program. It is not     intended to diagnose MRSA     infection nor to guide or     monitor treatment for     MRSA infections.     Studies: Ct Head Wo Contrast  11/18/2012   *RADIOLOGY REPORT*  Clinical Data: Unresponsive.  Code stroke.  CT HEAD WITHOUT CONTRAST  Technique:  Contiguous axial images were obtained from the base of the skull through the vertex without contrast.  Comparison: Head CT 11/18/2012 at 11:10 hours and head CT 07/13/2012 and 05/24/2011  Findings: Current head CT performed at 1900 hours.  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  Periventricular chronic microvascular ischemic changes are stable.  The ventricles are stable in size.  Negative for hemorrhage, hydrocephalus, mass effect, or midline shift.  No evidence of acute cortically based infarction is identified.  Calvarium is intact.  Visualized paranasal sinuses and mastoid air cells are clear.  IMPRESSION: No appreciable change since head CT performed earlier today. 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  No acute intracranial abnormality is identified.   Original Report Authenticated By: Curlene Dolphin, M.D.   Ct Head Wo Contrast  11/18/2012   CLINICAL DATA:  Altered mental status for 1 week.  EXAM: CT HEAD WITHOUT CONTRAST  TECHNIQUE: Contiguous axial images were obtained from the base of the skull through the vertex without intravenous contrast.  COMPARISON:  Head CT scan 07/13/2012.  FINDINGS:  Extensive encephalomalacia left temporal lobe is unchanged. Chronic microvascular ischemic change is also again seen. No evidence of acute abnormality including infarction, hemorrhage, mass lesion, mass effect, midline shift or abnormal extra-axial fluid collection is identified. There is no hydrocephalus or pneumocephalus. The calvarium is intact.  IMPRESSION: No acute finding.  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   Electronically Signed   By: Inge Rise M.D.   On: 11/18/2012 11:17   Dg Chest Port 1 View  11/18/2012   CLINICAL DATA:  Altered mental status  EXAM: PORTABLE CHEST - 1 VIEW  COMPARISON:  None.  FINDINGS: There is a small area of infiltrate in the  right base. There is mild subsegmental atelectasis left base. Lungs are otherwise clear. Heart size and pulmonary vascularity are normal. No adenopathy. There are surgical clips overlying the right paratracheal region.  IMPRESSION: Right base infiltrate. Subsegmental atelectasis left base.   Electronically Signed   By: Lowella Grip   On: 11/18/2012 19:19    Scheduled Meds: . cefTRIAXone (ROCEPHIN)  IV  1 g Intravenous Q24H  . diltiazem  240 mg Oral Daily  . insulin aspart  0-9 Units Subcutaneous TID WC  . sodium chloride  3 mL Intravenous Q12H   Continuous Infusions: . sodium chloride 75 mL/hr at 11/19/12 1213       Time spent: 35 minutes    [facility]  [facility] Pager [phone] If 7PM-7AM, please contact night-coverage at [email or web address], password [facility] 11/19/2012, 3:59 PM  LOS: 1 day

## 2012-11-19 NOTE — Progress Notes (Signed)
CRITICAL VALUE ALERT  Critical value received:  Dilantin 38.9 Date of notification:  11/18/2012  Time of notification:  [id number]  Critical value read back:yes  Nurse who received alert:  Luther Bradley  MD notified (1st page):  Dr Nicole Kindred  Time of first page:  2  MD notified (2nd page):  Time of second page:  Responding MD:  Dr. Nicole Kindred  Time MD responded:  [phone]

## 2012-11-20 LAB — CBC
MCH: 30.7 pg (ref 26.0–34.0)
MCHC: 34.2 g/dL (ref 30.0–36.0)
Platelets: 245 10*3/uL (ref 150–400)
RBC: 3.81 MIL/uL — ABNORMAL LOW (ref 3.87–5.11)

## 2012-11-20 LAB — BASIC METABOLIC PANEL
BUN: 17 mg/dL (ref 6–23)
Calcium: 8.6 mg/dL (ref 8.4–10.5)
GFR calc Af Amer: 81 mL/min — ABNORMAL LOW (ref >90)
GFR calc non Af Amer: 70 mL/min — ABNORMAL LOW (ref >90)
Glucose, Bld: 150 mg/dL — ABNORMAL HIGH (ref 70–99)
Potassium: 3.7 mEq/L (ref 3.5–5.1)
Sodium: 134 mEq/L — ABNORMAL LOW (ref 135–145)

## 2012-11-20 LAB — PHENYTOIN LEVEL, TOTAL: Phenytoin Lvl: 29.8 ug/mL — ABNORMAL HIGH (ref 10.0–20.0)

## 2012-11-20 LAB — GLUCOSE, CAPILLARY
Glucose-Capillary: 168 mg/dL — ABNORMAL HIGH (ref 70–99)
Glucose-Capillary: 185 mg/dL — ABNORMAL HIGH (ref 70–99)
Glucose-Capillary: 231 mg/dL — ABNORMAL HIGH (ref 70–99)
Glucose-Capillary: 188 mg/dL — ABNORMAL HIGH (ref 70–99)

## 2012-11-20 MED ORDER — DOCUSATE SODIUM 100 MG PO CAPS
100.0000 mg | ORAL_CAPSULE | Freq: Two times a day (BID) | ORAL | Status: DC
  Administered 2012-11-20 – 2012-11-21 (×2): 100 mg via ORAL
  Filled 2012-11-20 (×4): qty 1

## 2012-11-20 MED ORDER — INSULIN NPH (HUMAN) (ISOPHANE) 100 UNIT/ML ~~LOC~~ SUSP
20.0000 [IU] | Freq: Every day | SUBCUTANEOUS | Status: DC
  Administered 2012-11-21: 20 [IU] via SUBCUTANEOUS
  Filled 2012-11-20: qty 10

## 2012-11-20 MED ORDER — ADULT MULTIVITAMIN W/MINERALS CH
1.0000 | ORAL_TABLET | Freq: Every day | ORAL | Status: DC
  Administered 2012-11-20 – 2012-11-21 (×2): 1 via ORAL
  Filled 2012-11-20 (×2): qty 1

## 2012-11-20 MED ORDER — WARFARIN SODIUM 5 MG PO TABS
5.0000 mg | ORAL_TABLET | Freq: Once | ORAL | Status: AC
  Administered 2012-11-20: 5 mg via ORAL
  Filled 2012-11-20: qty 1

## 2012-11-20 MED ORDER — WARFARIN - PHARMACIST DOSING INPATIENT: Freq: Every day | Status: DC

## 2012-11-20 MED ORDER — INSULIN NPH (HUMAN) (ISOPHANE) 100 UNIT/ML ~~LOC~~ SUSP: 18.0000 [IU] | Freq: Every day | SUBCUTANEOUS | Status: DC

## 2012-11-20 MED ORDER — METOPROLOL TARTRATE 12.5 MG HALF TABLET
12.5000 mg | ORAL_TABLET | Freq: Two times a day (BID) | ORAL | Status: DC
  Administered 2012-11-20 – 2012-11-21 (×3): 12.5 mg via ORAL
  Filled 2012-11-20 (×4): qty 1

## 2012-11-20 NOTE — Progress Notes (Signed)
ANTICOAGULATION CONSULT NOTE - Initial Consult  Pharmacy Consult for Coumadin Indication: atrial fibrillation  Allergies  Allergen Reactions  . Procaine Hcl Anaphylaxis    Patient Measurements: Height: 5\' 8"  (172.7 cm) Weight: 195 lb 8.8 oz (88.7 kg) IBW/kg (Calculated) : 63.9  Vital Signs: Temp: 97.8 F (36.6 C) (09/21 0758) Temp src: Oral (09/21 0758) BP: 175/77 mmHg (09/21 0758) Pulse Rate: 66 (09/21 0758)  Labs:  Recent Labs  11/18/12 1025 11/19/12 0555 11/20/12 0410  HGB 11.9* 12.5 11.7*  HCT 35.6* 36.9 34.2*  PLT 277 277 245  APTT 29  --   --   LABPROT 13.7  --   --   INR 1.07  --   --   CREATININE 0.90 0.84 0.83  TROPONINI <0.30  --   --     Estimated Creatinine Clearance: 73.5 ml/min (by C-G formula based on Cr of 0.83).   Medical History: Past Medical History  Diagnosis Date  . Blindness of left eye   . Eye muscle weakness     Right eye weakness after cataract surgery  . Common migraine     ?hx of  . DIABETES MELLITUS, TYPE II 03/27/2006  . HYPERLIPIDEMIA 03/27/2006  . 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 HYPERTENSION 03/27/2006  . Atrial fibrillation     SVT dx 2007, cath 2007 mild CAD, then had an cardioversion, ablation; still on coumadin , has occ palpitation, EKG 03-2010 NSR  . Headache(784.0) 05/07/2009  . LUNG NODULE 09/01/2006    Excision, Bx Benighn  . Osteopenia 2004    Dexa 2004 showed Osteopenia, DEXA 03/2007 normal  . Seizures   . Intertrochanteric fracture of right hip 07/13/2012  . 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   Assessment:   72 yr old admitted 9/18 with altered mental status found to have Dilantin toxicity.  Dilantin dose increased as outpatient in late August due to low levels x 2.   Levels have come down over the last 3 days, but remain supratherapeutic at 29.8 mcg/ml, corrected level ~38 mcg/ml.  Confusion noted waxing and waning.   Was also on Coumadin for atrial fibrillation prior to admission, but INR was only 1.07, compliance  questionable.  Last office INR on 9/5 was 1.8 on Coumadin 2.5 mg daily except 5 mg on Fridays.  8/29 office INR was 1.1 on 2.5 mg daily.  Coumadin to resume without bridge today.  Goal of Therapy:  INR 2-3 Monitor platelets by anticoagulation protocol: Yes   Plan:   Coumadin 5 mg today.  Daily PT/INR.  Would she be a candidate for NOAC, with inconsistent INRs?  Arty Baumgartner, [place] Pager: [phone] 11/20/2012,10:16 AM

## 2012-11-20 NOTE — Progress Notes (Addendum)
[facility] Progress Note [place] TEAM 1 - Stepdown/ICU TEAM   [place] [id number] DOB: 10-May-1940 DOA: 11/18/2012 PCP: [name], MD  Admit HPI / Brief Narrative: 72 y.o. 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 Patient was brought to the emergency department because of worsening sleepiness and altered mental status.  She was told that she had more forgetfulness, confusion and lethargy.  Initial evaluation in the emergency department showed Dilantin level of 35.4 corrected to 45.4. Her urinalysis was consistent with UTI.   Patient had an episode of unresponsiveness after admission, and was subsequently transferred to step down unit.  Assessment/Plan:  Acute toxic metabolic encephalopathy -Lethargy and increased confusion waxing and waning -likely due to UTI + dilantin toxicity -CT head negative -mental status improving   Unresponsive episode while hospitalized  -Per RN CBG was 44 during episode  -no recurrence   Dilantin toxicity  -Corrected Dilantin level was over 45 at presentation  -Dilantin level now corrects to 38 -continue to hold dilantin  Coag Negative Staph UTI  -Rocephin  -plant to transition to oral med in next 24-48hrs    Diabetes mellitus type 2  -had episodic hypoglycemia this admit -CBG now above goal  -A1c 7.8 -adjust tx plan and follow trend   PAfib  -S/p [facility] & ablation previously - followed by Dr. Crissie Sickles  -resume warfarin as per home regimen, though INR was normal at time of admit - will not overlap but simply allow to slowly return to therapeutic range   HTN BP above goal - adjust tx and follow   RLL infiltrate? No clinical sx to suggest true PNA - will f/u CXR in AM  Code Status: FULL Family Communication: no family present at time of exam Disposition Plan: transfer to med bed back to Team 3 - Dr. Hartford Poli - begin PT/OT    Consultants: Neurology  Procedures: None  Antibiotics: Rocephin 9/19 >>  DVT prophylaxis: SCDs + warfarin  HPI/Subjective: The patient is alert and conversant.  She is mildly confused.  She denies headache fevers chills shortness of breath or chest pain.  Objective: Blood pressure 175/77, pulse 66, temperature 97.8 F (36.6 C), temperature source Oral, resp. rate 19, height 5\' 8"  (1.727 m), weight 88.7 kg (195 lb 8.8 oz), SpO2 91.00%.  Intake/Output Summary (Last 24 hours) at 11/20/12 0902 Last data filed at 11/20/12 0700  Gross per 24 hour  Intake   1100 ml  Output    900 ml  Net    200 ml    Exam: General: No acute respiratory distress Lungs: Clear to auscultation bilaterally without wheezes or crackles Cardiovascular: Regular rate and rhythm without murmur gallop or rub  Abdomen: Nontender, nondistended, soft, bowel sounds positive, no rebound, no ascites, no appreciable mass Extremities: No significant cyanosis, clubbing, or edema bilateral lower extremities  Data Reviewed: Basic Metabolic Panel:  Recent Labs Lab 11/18/12 1025 11/19/12 0555 11/20/12 0410  NA 135 136 134*  K 4.1 3.4* 3.7  CL 97 100 100  CO2 29 26 26   GLUCOSE 299* 86 150*  BUN 18 16 17   CREATININE 0.90 0.84 0.83  CALCIUM 9.4 9.1 8.6   Liver Function Tests:  Recent Labs Lab 11/18/12 1025  AST 14  ALT 11  ALKPHOS 192*  BILITOT 0.2*  PROT 6.8  ALBUMIN 3.4*   CBC:  Recent Labs Lab 11/18/12 1025 11/19/12 0555 11/20/12 0410  WBC 4.8 6.5  6.1  NEUTROABS 3.2  --   --   HGB 11.9* 12.5 11.7*  HCT 35.6* 36.9 34.2*  MCV 91.3 90.2 89.8  PLT 277 277 245   Cardiac Enzymes:  Recent Labs Lab 11/18/12 1025  TROPONINI <0.30   CBG:  Recent Labs Lab 11/19/12 0834 11/19/12 1157 11/19/12 1648 11/19/12 2136 11/20/12 0800  GLUCAP 96 185* 300* 191* 168*    Recent Results (from the past 240 hour(s))  URINE CULTURE     Status: None   Collection Time    11/18/12 11:15 AM       Result Value Range Status   Specimen Description URINE, CATHETERIZED   Final   Special Requests NONE   Final   Culture  Setup Time     Final   Value: 11/18/2012 14:48     Performed at [facility] PENDING   Incomplete   Culture     Final   Value: STAPHYLOCOCCUS SPECIES (COAGULASE NEGATIVE)     Note: RIFAMPIN AND GENTAMICIN SHOULD NOT BE USED AS SINGLE DRUGS FOR TREATMENT OF STAPH INFECTIONS.     Performed at [facility]   Report Status PENDING   Incomplete  MRSA PCR SCREENING     Status: None   Collection Time    11/18/12 10:49 PM      Result Value Range Status   MRSA by PCR NEGATIVE  NEGATIVE Final   Comment:            The GeneXpert MRSA Assay (FDA     approved for NASAL specimens     only), is one component of a     comprehensive MRSA colonization     surveillance program. It is not     intended to diagnose MRSA     infection nor to guide or     monitor treatment for     MRSA infections.     Studies:  Recent x-ray studies have been reviewed in detail by the Attending Physician  Scheduled Meds:  Scheduled Meds: . cefTRIAXone (ROCEPHIN)  IV  1 g Intravenous Q24H  . diltiazem  240 mg Oral Daily  . insulin aspart  0-9 Units Subcutaneous TID WC  . sodium chloride  3 mL Intravenous Q12H    Time spent on care of this patient: 35 mins   [place]  [phone] Pager - Text Page per Shea Evans as per below:  On-Call/Text Page:      [email or web address]      password [email or web address]  If 7PM-7AM, please contact night-coverage [email or web address] Password [email or web address] 11/20/2012, 9:02 AM   LOS: 2 days

## 2012-11-21 ENCOUNTER — Inpatient Hospital Stay (HOSPITAL_COMMUNITY): Payer: Medicare Other

## 2012-11-21 ENCOUNTER — Encounter (HOSPITAL_COMMUNITY): Payer: Self-pay | Admitting: *Deleted

## 2012-11-21 DIAGNOSIS — G40909 Epilepsy, unspecified, not intractable, without status epilepticus: Secondary | ICD-10-CM

## 2012-11-21 DIAGNOSIS — T420X1A Poisoning by hydantoin derivatives, accidental (unintentional), initial encounter: Secondary | ICD-10-CM

## 2012-11-21 LAB — GLUCOSE, CAPILLARY

## 2012-11-21 LAB — PROTIME-INR
INR: 1.05 (ref 0.00–1.49)
Prothrombin Time: 13.5 seconds (ref 11.6–15.2)

## 2012-11-21 LAB — URINE CULTURE: Colony Count: 45000

## 2012-11-21 MED ORDER — WARFARIN SODIUM 5 MG PO TABS
5.0000 mg | ORAL_TABLET | Freq: Once | ORAL | Status: DC
  Filled 2012-11-21: qty 1

## 2012-11-21 MED ORDER — DOXYCYCLINE HYCLATE 100 MG PO TABS: 100.0000 mg | ORAL_TABLET | Freq: Two times a day (BID) | ORAL | Status: DC

## 2012-11-21 NOTE — Progress Notes (Signed)
Physical Therapy Evaluation Patient Details Name: [name] [name] MRN: [id number] DOB: [birth year] Today's Date: 11/21/2012 Time: 1355-1420 PT Time Calculation (min): 25 min  PT Assessment / Plan / Recommendation History of Present Illness  Pt presented with lethargy and declining mental status. She has a h/o right hip fx with pinning 06/2012, DM, left eye blindness, seizures,and A-fib.    Clinical Impression  Pt appears to have returned to baseline, cognitively and functionally. She will continue to be a high fall risk and does not like walking with her RW, however, much education given on her likelihood of future falls and she is in agreement that she will use RW when she gets home. No acute or f/u PT needs at this time. PT signing off.    PT Assessment  Patent does not need any further PT services    Follow Up Recommendations  No PT follow up;Supervision - Intermittent    Does the patient have the potential to tolerate intense rehabilitation      Barriers to Discharge        Equipment Recommendations  None recommended by PT    Recommendations for Other Services     Frequency      Precautions / Restrictions Precautions Precautions: Fall Precaution Comments: pt has h/o multiple falls with fractures: hand, shoulder, hip Restrictions Weight Bearing Restrictions: No   Pertinent Vitals/Pain No c/o pain      Mobility  Bed Mobility Bed Mobility: Not assessed (pt in chair) Transfers Transfers: Sit to Stand;Stand to Sit Sit to Stand: 6: Modified independent (Device/Increase time);From chair/3-in-1 Stand to Sit: 6: Modified independent (Device/Increase time);To chair/3-in-1 Ambulation/Gait Ambulation/Gait Assistance: 5: Supervision Ambulation Distance (Feet): 200 Feet Assistive device: None Ambulation/Gait Assistance Details: pt usually uses SPC or nothing though she has a RW. She ambulates with widened BOS adn increased sway as well as upper body momentum sometimes  exceeding lower body. She was educated on her high fall risk and that she is really safer with her RW. She agrees that despite not liking it, she will use it when she gets home and has been told to do so by her HHPT and husband.  Gait Pattern: Step-through pattern;Wide base of support Gait velocity: WFL Stairs: No Wheelchair Mobility Wheelchair Mobility: No    Exercises Other Exercises Other Exercises: discussed exercises she has been doing for right hip and encouraged her to continue with them   PT Diagnosis:    PT Problem List:   PT Treatment Interventions:       PT Goals(Current goals can be found in the care plan section) Acute Rehab PT Goals Patient Stated Goal: return home PT Goal Formulation: No goals set, d/c therapy  Visit Information  Last PT Received On: 11/21/12 Assistance Needed: +1 History of Present Illness: Pt presented with lethargy and declining mental status. She has a h/o right hip fx with pinning 06/2012, DM, left eye blindness, seizures,and A-fib.         Prior [place] expects to be discharged to:: Private residence Living Arrangements: Spouse/significant other Available Help at Discharge: Family;Available PRN/intermittently Type of Home: House Home Access: Stairs to enter [employer] of Steps: 1 Entrance Stairs-Rails: None Home Layout: Multi-level;Able to live on main level with bedroom/bathroom Alternate Level Stairs-Number of Steps: flight Alternate Level Stairs-Rails: Right Home Equipment: Cane - single point;Walker - 2 wheels Additional Comments: pt has a walk-in shower Prior Function Level of Independence: Independent with assistive device(s) Comments: pt does not drive, blind in  left eye. Communication Communication: No difficulties Dominant Hand: Right    Cognition  Cognition Arousal/Alertness: Awake/alert Behavior During Therapy: WFL for tasks assessed/performed Overall Cognitive Status:  History of cognitive impairments - at baseline Memory: Decreased short-term memory    Extremity/Trunk Assessment Upper Extremity Assessment Upper Extremity Assessment: Overall WFL for tasks assessed Lower Extremity Assessment Lower Extremity Assessment: Overall WFL for tasks assessed Cervical / Trunk Assessment Cervical / Trunk Assessment: Normal   Balance Balance Balance Assessed: Yes Dynamic Standing Balance Dynamic Standing - Balance Support: No upper extremity supported;During functional activity Dynamic Standing - Level of Assistance: 4: Min assist (min-guard A)  End of Session PT - End of Session Equipment Utilized During Treatment: Gait belt Activity Tolerance: Patient tolerated treatment well Patient left: in chair;with call bell/phone within reach Nurse Communication: Mobility status  GP   Leighton Roach, [place]  [place], [place] 11/21/2012, 2:28 PM

## 2012-11-21 NOTE — Discharge Summary (Signed)
Physician Discharge Summary  [name] [id number] DOB: 12-20-1940 DOA: 11/18/2012  PCP: [name], MD  Admit date: 11/18/2012 Discharge date: 11/21/2012  Time spent: 40 minutes  Recommendations for Outpatient Follow-up:  1. Followup with primary care physician within one week. 2. Check Dilantin level in one week.  Discharge Diagnoses:  Principal Problem:   Acute encephalopathy Active Problems:   HYPERTENSION   Seizure disorder   DM (diabetes mellitus)   UTI (urinary tract infection)   History of encephalitis   Discharge Condition: Stable  Diet recommendation: Diabetic diet  Filed Weights   11/18/12 1559 11/19/12 0439 11/21/12 0511  Weight: 87.4 kg (192 lb 10.9 oz) 88.7 kg (195 lb 8.8 oz) 86.7 kg (191 lb 2.2 oz)    History of present illness:  [name] [name] is a 72 y.o. 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 Patient brought to the emergency department because of worsening sleepiness and altered mental status. I evaluated the patient and she mentioned since she had the "brain issue" she does not remember very well, but today she was told that she had more forgetfulness, confusion and lethargy.  Initial evaluation in the emergency department showed Dilantin level of 35.4 corrected to 45.4 per neurology. Her urinalysis consistent with UTI. Patient had an episode of unresponsiveness after admission, subsequently transferred to step down unit.  Hospital Course:   Acute encephalopathy  -Lethargy and increased confusion waxing and waning, unclear etiology.  -This is likely secondary to UTI and Dilantin toxicity.  -CT scan from last night was negative, patient back to her normal self.   Unresponsive episode  -Per RN nurse was unresponsive her CBG was 44 she was giving him both D50 with blood sugar recovered to 220.  -Patient did not regain her consciousness after blood sugar  normalized.  -Unclear etiology for unresponsive episode likely to be UTI on Dilantin toxicity.  -This is resolved completely prior to discharge.  Dilantin toxicity  -Corrected Dilantin level is over 45.  -Dilantin dose decreased from 150-100 mg daily, aspirin to take Dilantin back on Wednesday.  -Plan discussed with her husband who seems very supportive and takes care of her medications. -Dilantin level today is 29, patient is very functional, no lethargy since 2 days ago.  UTI  -Culture grew coagulase-negative staph which is oxacillin resistant. -Discharge on doxycycline for 5 more days.  Diabetes mellitus type 2  -Hold her home insulin, started on insulin sliding scale.  -Currently n.p.o., when she is awake and alert, diet can be advanced to carbohydrate modified diet, check Hb A1c.  Procedures:  None  Consultations:  None  Discharge Exam: Filed Vitals:   11/21/12 1147  BP: 152/60  Pulse: 56  Temp: 97.4 F (36.3 C)  Resp: 15   General: Alert and awake, oriented x3, not in any acute distress. HEENT: anicteric sclera, pupils reactive to light and accommodation, EOMI CVS: S1-S2 clear, no murmur rubs or gallops Chest: clear to auscultation bilaterally, no wheezing, rales or rhonchi Abdomen: soft nontender, nondistended, normal bowel sounds, no organomegaly Extremities: no cyanosis, clubbing or edema noted bilaterally Neuro: Cranial nerves II-XII intact, no focal neurological deficits  Discharge Instructions  Discharge Orders   Future Appointments Provider Department Dept Phone   12/23/2012 11:15 AM Renato Shin, MD [facility] [phone]   02/17/2013 1:45 PM Colon Branch, MD [place]  [facility] [place]   03/30/2013 2:00 PM Pramod S  Leonie Man, MD [facility] [phone]   Future Orders Complete By Expires   Diet Carb Modified  As directed    Increase activity slowly  As directed        Medication  List    STOP taking these medications       phenytoin 50 MG tablet  Commonly known as:  DILANTIN      TAKE these medications       albuterol 108 (90 BASE) MCG/ACT inhaler  Commonly known as:  VENTOLIN HFA  Inhale 2 puffs into the lungs every 6 (six) hours as needed for wheezing.     aspirin EC 81 MG tablet  Take 81 mg by mouth once a week.     calcium-vitamin D 500-200 MG-UNIT per tablet  Commonly known as:  OSCAL WITH D  Take 1 tablet by mouth daily.     colchicine 0.6 MG tablet  Take 1 tablet (0.6 mg total) by mouth 2 (two) times daily.     diltiazem 240 MG 24 hr capsule  Commonly known as:  CARDIZEM CD  Take 1 capsule (240 mg total) by mouth daily.     doxycycline 100 MG tablet  Commonly known as:  VIBRA-TABS  Take 1 tablet (100 mg total) by mouth 2 (two) times daily.     DSS 100 MG Caps  Take 100 mg by mouth 2 (two) times daily.     insulin NPH-regular (70-30) 100 UNIT/ML injection  Commonly known as:  HUMULIN 70/30  Inject 35 Units into the skin daily with breakfast.     LORazepam 0.5 MG tablet  Commonly known as:  ATIVAN  Take 1 tablet (0.5 mg total) by mouth 2 (two) times daily as needed for anxiety.     metoprolol tartrate 25 MG tablet  Commonly known as:  LOPRESSOR  Take 0.5 tablets (12.5 mg total) by mouth 2 (two) times daily.     multivitamin with minerals Tabs tablet  Take 1 tablet by mouth daily.     phenytoin 100 MG ER capsule  Commonly known as:  DILANTIN  Take 100 mg by mouth 3 (three) times daily.     pravastatin 40 MG tablet  Commonly known as:  PRAVACHOL  TAKE ONE TABLET BY MOUTH EVERY DAY     ranitidine 300 MG tablet  Commonly known as:  ZANTAC  Take 1 tablet (300 mg total) by mouth at bedtime.     sennosides-docusate sodium 8.6-50 MG tablet  Commonly known as:  SENOKOT-S  Take 2 tablets by mouth daily.     warfarin 2.5 MG tablet  Commonly known as:  COUMADIN  Take 1 tablet (2.5 mg total) by mouth daily.     zolpidem 5 MG  tablet  Commonly known as:  AMBIEN  Take 1 tablet (5 mg total) by mouth at bedtime as needed for sleep.       Allergies  Allergen Reactions  . Procaine Hcl Anaphylaxis       Follow-up Information   Follow up with [name], MD In 1 week.   Specialty:  Internal Medicine   Contact information:   [phone] [address] [facility] [address] [place] [address] [phone]        The results of significant diagnostics from this hospitalization (including imaging, microbiology, ancillary and laboratory) are listed below for reference.    Significant Diagnostic Studies: Ct Head Wo Contrast  11/18/2012   *RADIOLOGY REPORT*  Clinical Data: Unresponsive.  Code stroke.  CT  HEAD WITHOUT CONTRAST  Technique:  Contiguous axial images were obtained from the base of the skull through the vertex without contrast.  Comparison: Head CT 11/18/2012 at 11:10 hours and head CT 07/13/2012 and 05/24/2011  Findings: Current head CT performed at 1900 hours.  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  Periventricular chronic microvascular ischemic changes are stable.  The ventricles are stable in size.  Negative for hemorrhage, hydrocephalus, mass effect, or midline shift.  No evidence of acute cortically based infarction is identified.  Calvarium is intact.  Visualized paranasal sinuses and mastoid air cells are clear.  IMPRESSION: No appreciable change since head CT performed earlier today. 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  No acute intracranial abnormality is identified.   Original Report Authenticated By: Curlene Dolphin, M.D.   Ct Head Wo Contrast  11/18/2012   CLINICAL DATA:  Altered mental status for 1 week.  EXAM: CT HEAD WITHOUT CONTRAST  TECHNIQUE: Contiguous axial images were obtained from the base of the skull through the vertex without intravenous contrast.  COMPARISON:  Head CT scan 07/13/2012.  FINDINGS:  Extensive encephalomalacia left temporal lobe is unchanged. Chronic microvascular ischemic change is also again seen. No evidence of acute abnormality including infarction, hemorrhage, mass lesion, mass effect, midline shift or abnormal extra-axial fluid collection is identified. There is no hydrocephalus or pneumocephalus. The calvarium is intact.  IMPRESSION: No acute finding.  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   Electronically Signed   By: Inge Rise M.D.   On: 11/18/2012 11:17   Dg Chest Port 1 View  11/21/2012   CLINICAL DATA:  Followup. Question right lower lobe infiltrate.  EXAM: PORTABLE CHEST - 1 VIEW  COMPARISON:  11/18/2012  FINDINGS: Infiltrate at the right base is less well seen currently. There are now streaky lower lung opacities, favoring atelectasis. No change in heart size and mediastinal contours. Right peritracheal surgical clips again seen. No effusion or pneumothorax.  IMPRESSION: Improved aeration of the lower lobes, now with only mild atelectasis.   Electronically Signed   By: Jorje Guild   On: 11/21/2012 05:54   Dg Chest Port 1 View  11/18/2012   CLINICAL DATA:  Altered mental status  EXAM: PORTABLE CHEST - 1 VIEW  COMPARISON:  None.  FINDINGS: There is a small area of infiltrate in the right base. There is mild subsegmental atelectasis left base. Lungs are otherwise clear. Heart size and pulmonary vascularity are normal. No adenopathy. There are surgical clips overlying the right paratracheal region.  IMPRESSION: Right base infiltrate. Subsegmental atelectasis left base.   Electronically Signed   By: Lowella Grip   On: 11/18/2012 19:19    Microbiology: Recent Results (from the past 240 hour(s))  URINE CULTURE     Status: None   Collection Time    11/18/12 11:15 AM      Result Value Range Status   Specimen Description URINE, CATHETERIZED   Final   Special Requests NONE   Final   Culture  Setup  Time     Final   Value: 11/18/2012 14:48     Performed at [facility]     Final   Value: 45,000 COLONIES/ML     Performed at [facility]   Culture     Final   Value: STAPHYLOCOCCUS SPECIES (COAGULASE NEGATIVE)     Note: RIFAMPIN AND GENTAMICIN  SHOULD NOT BE USED AS SINGLE DRUGS FOR TREATMENT OF STAPH INFECTIONS.     Performed at [facility]   Report Status 11/21/2012 FINAL   Final   Organism ID, Bacteria STAPHYLOCOCCUS SPECIES (COAGULASE NEGATIVE)   Final  MRSA PCR SCREENING     Status: None   Collection Time    11/18/12 10:49 PM      Result Value Range Status   MRSA by PCR NEGATIVE  NEGATIVE Final   Comment:            The GeneXpert MRSA Assay (FDA     approved for NASAL specimens     only), is one component of a     comprehensive MRSA colonization     surveillance program. It is not     intended to diagnose MRSA     infection nor to guide or     monitor treatment for     MRSA infections.     Labs: Basic Metabolic Panel:  Recent Labs Lab 11/18/12 1025 11/19/12 0555 11/20/12 0410  NA 135 136 134*  K 4.1 3.4* 3.7  CL 97 100 100  CO2 29 26 26   GLUCOSE 299* 86 150*  BUN 18 16 17   CREATININE 0.90 0.84 0.83  CALCIUM 9.4 9.1 8.6   Liver Function Tests:  Recent Labs Lab 11/18/12 1025  AST 14  ALT 11  ALKPHOS 192*  BILITOT 0.2*  PROT 6.8  ALBUMIN 3.4*   No results found for this basename: LIPASE, AMYLASE,  in the last 168 hours No results found for this basename: AMMONIA,  in the last 168 hours CBC:  Recent Labs Lab 11/18/12 1025 11/19/12 0555 11/20/12 0410  WBC 4.8 6.5 6.1  NEUTROABS 3.2  --   --   HGB 11.9* 12.5 11.7*  HCT 35.6* 36.9 34.2*  MCV 91.3 90.2 89.8  PLT 277 277 245   Cardiac Enzymes:  Recent Labs Lab 11/18/12 1025  TROPONINI <0.30   BNP: BNP (last 3 results) No results found for this basename: PROBNP,  in the last 8760 hours CBG:  Recent Labs Lab 11/20/12 1154 11/20/12 1609  11/20/12 2158 11/21/12 0849 11/21/12 1156  GLUCAP 185* 231* 188* 186* 199*       Signed:  Mikea Quadros A  [facility] 11/21/2012, 3:14 PM

## 2012-11-21 NOTE — Progress Notes (Signed)
[facility] for Coumadin Indication: atrial fibrillation  Allergies  Allergen Reactions  . Procaine Hcl Anaphylaxis    Patient Measurements: Height: 5\' 8"  (172.7 cm) Weight: 191 lb 2.2 oz (86.7 kg) IBW/kg (Calculated) : 63.9  Vital Signs: Temp: 97.4 F (36.3 C) (09/22 0800) Temp src: Oral (09/22 0800) BP: 164/67 mmHg (09/22 0511) Pulse Rate: 63 (09/22 0511)  Labs:  Recent Labs  11/18/12 1025 11/19/12 0555 11/20/12 0410 11/21/12 0605  HGB 11.9* 12.5 11.7*  --   HCT 35.6* 36.9 34.2*  --   PLT 277 277 245  --   APTT 29  --   --   --   LABPROT 13.7  --   --  13.5  INR 1.07  --   --  1.05  CREATININE 0.90 0.84 0.83  --   TROPONINI <0.30  --   --   --     Estimated Creatinine Clearance: 72.7 ml/min (by C-G formula based on Cr of 0.83).  Assessment: 72 yr old admitted 9/18 with altered mental status found to have Dilantin toxicity.  Dilantin dose increased as outpatient in late August due to low levels x 2.   Levels have come down over the last 3 days, but remain supratherapeutic at 29.8 mcg/ml, corrected level ~38 mcg/ml.  Confusion noted waxing and waning.  Was also on Coumadin for atrial fibrillation prior to admission, but INR was only 1.07, compliance questionable.  Last office INR on 9/5 was 1.8 on Coumadin 2.5 mg daily except 5 mg on Fridays.  8/29 office INR was 1.1 on 2.5 mg daily   Coumadin resumed without bridge 9/21.  INR = 1.05   - ? Candidate for NOAC  Goal of Therapy:  INR 2-3 Monitor platelets by anticoagulation protocol: Yes   Plan:  Coumadin 5 mg today. Daily PT/INR.  Thank you. Anette Guarneri, PharmD [phone]   11/21/2012,9:00 AM

## 2012-11-24 ENCOUNTER — Ambulatory Visit (INDEPENDENT_AMBULATORY_CARE_PROVIDER_SITE_OTHER): Payer: Medicare Other | Admitting: Internal Medicine

## 2012-11-24 VITALS — BP 152/79 | HR 58 | Temp 98.0°F | Wt 195.5 lb

## 2012-11-24 DIAGNOSIS — N390 Urinary tract infection, site not specified: Secondary | ICD-10-CM

## 2012-11-24 DIAGNOSIS — G40909 Epilepsy, unspecified, not intractable, without status epilepticus: Secondary | ICD-10-CM

## 2012-11-24 DIAGNOSIS — I4891 Unspecified atrial fibrillation: Secondary | ICD-10-CM

## 2012-11-24 DIAGNOSIS — I10 Essential (primary) hypertension: Secondary | ICD-10-CM

## 2012-11-24 NOTE — Assessment & Plan Note (Addendum)
Recent Dilantin level was increased associated with mental status changes, currently taking Dilantin 100 mg 3 times a day. Plan: Check a Dilantin level in one week. MS changes resolved, back to normal

## 2012-11-24 NOTE — Progress Notes (Signed)
  Subjective:    Patient ID: [name] [name], female    DOB: 31-Jan-1941, 72 y.o.   MRN: [id number]  [facility] f/u Discharge home 11/21/2012. Admitted  with mental status changes due to ?Dilantin toxicity, ? UTI Labs, d/c summary and x-ray reviewed: Labs sodium is slightly low at 134, alkaline phosphate slightly elevated. CBC stable. Initial Dilantin level was 38.9, subsequent Dilantin level 29.4. Urine culture 11/17/2012 + staph coagulase negative Last INR 11/21/2012--- 1.05  Past Medical History  Diagnosis Date  . Blindness of left eye   . Eye muscle weakness     Right eye weakness after cataract surgery  . Common migraine     ?hx of  . DIABETES MELLITUS, TYPE II 03/27/2006  . HYPERLIPIDEMIA 03/27/2006  . 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 HYPERTENSION 03/27/2006  . Atrial fibrillation     SVT dx 2007, cath 2007 mild CAD, then had an cardioversion, ablation; still on coumadin , has occ palpitation, EKG 03-2010 NSR  . Headache(784.0) 05/07/2009  . LUNG NODULE 09/01/2006    Excision, Bx Benighn  . Osteopenia 2004    Dexa 2004 showed Osteopenia, DEXA 03/2007 normal  . Seizures   . Intertrochanteric fracture of right hip 07/13/2012  . 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 Shoulder surgery    . Femur im nail Right 07/15/2012    Procedure: INTRAMEDULLARY (IM) NAIL HIP;  Surgeon: Johnny Bridge, MD;  Location: [place];  Service: Orthopedics;  Laterality: Right;   History   Social History  . Marital Status: Married    Spouse Name: Jenny Reichmann    Number of Children: 1  . Years of Education: College   Occupational History  .     Social History Main Topics  . Smoking status: Former Smoker    Types: Cigarettes    Quit date: 03/02/1978  . Smokeless tobacco: Never Used  . Alcohol Use: No  . Drug Use: No  . Sexual Activity: No   Other Topics Concern  . Not on file   Social History Narrative   Patient lives at home spouse.   Moved  from [place] 2004          Review of Systems Since she left the hospital she feels great. We reviewed the medication list, she is not clear about her Coumadin dose. She is taking doxycycline as prescribed in the hospital. Has decrease Dilantin to 1 tablet 3 times a day. Taking Ativan as needed only. Blood sugars running about 104, 120 in the morning. Denies dysuria or gross hematuria. No fever or chills. No nausea, vomiting, diarrhea. Mental status: Back to normal     Objective:   Physical Exam  BP 152/79  Pulse 58  Temp(Src) 98 F (36.7 C)  Wt 195 lb 8 oz (88.678 kg)  BMI 29.73 kg/m2  SpO2 97% General -- alert, well-developed, NAD.   Lungs -- normal respiratory effort, no intercostal retractions, no accessory muscle use, and normal breath sounds.  Heart-- normal rate, regular rhythm, no murmur.  Abdomen-- Not distended, good bowel sounds,soft, non-tender.no CVA tenderness  Extremities-- no pretibial edema bilaterally  Neurologic-- alert & oriented X3.  Psych-- Cognition and judgment appear intact. Cooperative with normal attention span and concentration. No anxious appearing , no depressed appearing.          Assessment & Plan:

## 2012-11-24 NOTE — Assessment & Plan Note (Signed)
Slightly elevated today, no change for now, last sodium is slightly low, BMP recheck next week.

## 2012-11-24 NOTE — Patient Instructions (Addendum)
Continue weight the same medications  Please arrange labs for next week (Monday or Tuesday): Dilantin level--- dx sz d/o BMP--- dx HTN UA, urine culture-- dx  UTI  Next visit in 2 months  Contact the Coumadin clinic ASAP

## 2012-11-24 NOTE — Assessment & Plan Note (Addendum)
Last INR not therapeutic, apparently not taking Coumadin recently. Will contact the cardiology clinic today to let them know.

## 2012-11-24 NOTE — Assessment & Plan Note (Signed)
Recently admitted to the hospital with mental status changes . Related to Dilantin intoxication? UTI? Urinalysis upon admission showed   abundant wbc's, urine culture showed staph coag (-). Currently on  doxycycline and asymptomatic. Plan:  Finish abx UA, urine culture next week

## 2012-11-25 ENCOUNTER — Ambulatory Visit (INDEPENDENT_AMBULATORY_CARE_PROVIDER_SITE_OTHER): Payer: Medicare Other | Admitting: *Deleted

## 2012-11-25 DIAGNOSIS — I4892 Unspecified atrial flutter: Secondary | ICD-10-CM

## 2012-11-25 DIAGNOSIS — I4891 Unspecified atrial fibrillation: Secondary | ICD-10-CM

## 2012-11-25 DIAGNOSIS — Z7901 Long term (current) use of anticoagulants: Secondary | ICD-10-CM

## 2012-11-26 ENCOUNTER — Encounter: Payer: Self-pay | Admitting: Internal Medicine

## 2012-11-29 ENCOUNTER — Other Ambulatory Visit (INDEPENDENT_AMBULATORY_CARE_PROVIDER_SITE_OTHER): Payer: Medicare Other

## 2012-11-29 DIAGNOSIS — N390 Urinary tract infection, site not specified: Secondary | ICD-10-CM

## 2012-11-29 DIAGNOSIS — I10 Essential (primary) hypertension: Secondary | ICD-10-CM

## 2012-11-29 DIAGNOSIS — G40909 Epilepsy, unspecified, not intractable, without status epilepticus: Secondary | ICD-10-CM

## 2012-11-29 LAB — URINALYSIS, ROUTINE W REFLEX MICROSCOPIC
Ketones, ur: NEGATIVE
Nitrite: NEGATIVE
Total Protein, Urine: NEGATIVE
Urine Glucose: 250
pH: 7 (ref 5.0–8.0)

## 2012-11-29 LAB — BASIC METABOLIC PANEL
BUN: 16 mg/dL (ref 6–23)
CO2: 29 mEq/L (ref 19–32)
Calcium: 9 mg/dL (ref 8.4–10.5)
Chloride: 99 mEq/L (ref 96–112)
Creatinine, Ser: 1.1 mg/dL (ref 0.4–1.2)
Potassium: 4.6 mEq/L (ref 3.5–5.1)

## 2012-11-30 LAB — URINE CULTURE: Organism ID, Bacteria: NO GROWTH

## 2012-11-30 LAB — PHENYTOIN LEVEL, TOTAL: Phenytoin Lvl: 14.1 ug/mL (ref 10.0–20.0)

## 2012-12-01 ENCOUNTER — Ambulatory Visit (INDEPENDENT_AMBULATORY_CARE_PROVIDER_SITE_OTHER): Payer: Medicare Other | Admitting: General Practice

## 2012-12-01 DIAGNOSIS — Z7901 Long term (current) use of anticoagulants: Secondary | ICD-10-CM

## 2012-12-01 DIAGNOSIS — I4891 Unspecified atrial fibrillation: Secondary | ICD-10-CM

## 2012-12-01 DIAGNOSIS — I4892 Unspecified atrial flutter: Secondary | ICD-10-CM

## 2012-12-01 LAB — POCT INR: INR: 1.5

## 2012-12-02 ENCOUNTER — Ambulatory Visit (INDEPENDENT_AMBULATORY_CARE_PROVIDER_SITE_OTHER): Payer: Medicare Other | Admitting: Endocrinology

## 2012-12-02 ENCOUNTER — Encounter: Payer: Self-pay | Admitting: Endocrinology

## 2012-12-02 VITALS — BP 132/72 | HR 67 | Wt 195.0 lb

## 2012-12-02 DIAGNOSIS — E119 Type 2 diabetes mellitus without complications: Secondary | ICD-10-CM

## 2012-12-02 NOTE — Progress Notes (Signed)
Subjective:    Patient ID: [name] [name], female    DOB: 1940-11-20, 72 y.o.   MRN: [id number]  HPI Pt returns for f/u of insulin-requiring DM (dx'ed 1992; she has moderate neuropathy of the lower extremities, and associated retinopathy; therapy has been limited by pt's need for a simple, inexpensive insulin regimen; she had an episode of severe hypoglycemia in September, 2013).  Pt reports a few days of an increase in her cbg's to 200-300's.  Recent a1c's have been good, and she is on the same insulin as then.  Past Medical History  Diagnosis Date  . Blindness of left eye   . Eye muscle weakness     Right eye weakness after cataract surgery  . Common migraine     ?hx of  . DIABETES MELLITUS, TYPE II 03/27/2006  . HYPERLIPIDEMIA 03/27/2006  . 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 HYPERTENSION 03/27/2006  . Atrial fibrillation     SVT dx 2007, cath 2007 mild CAD, then had an cardioversion, ablation; still on coumadin , has occ palpitation, EKG 03-2010 NSR  . Headache(784.0) 05/07/2009  . LUNG NODULE 09/01/2006    Excision, Bx Benighn  . Osteopenia 2004    Dexa 2004 showed Osteopenia, DEXA 03/2007 normal  . Seizures   . Intertrochanteric fracture of right hip 07/13/2012  . 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 Shoulder surgery    . Femur im nail Right 07/15/2012    Procedure: INTRAMEDULLARY (IM) NAIL HIP;  Surgeon: Johnny Bridge, MD;  Location: [place];  Service: Orthopedics;  Laterality: Right;    History   Social History  . Marital Status: Married    Spouse Name: Jenny Reichmann    Number of Children: 1  . Years of Education: College   Occupational History  .     Social History Main Topics  . Smoking status: Former Smoker    Types: Cigarettes    Quit date: 03/02/1978  . Smokeless tobacco: Never Used  . Alcohol Use: No  . Drug Use: No  . Sexual Activity: No   Other Topics Concern  . Not on file   Social History Narrative   Patient lives at home spouse.   Moved from [place] 2004        Current Outpatient Prescriptions on File Prior to Visit  Medication Sig Dispense Refill  . albuterol (VENTOLIN HFA) 108 (90 BASE) MCG/ACT inhaler Inhale 2 puffs into the lungs every 6 (six) hours as needed for wheezing.  1 Inhaler  1  . aspirin EC 81 MG tablet Take 81 mg by mouth once a week.       . calcium-vitamin D (OSCAL WITH D) 500-200 MG-UNIT per tablet Take 1 tablet by mouth daily.      . colchicine 0.6 MG tablet Take 1 tablet (0.6 mg total) by mouth 2 (two) times daily.  60 tablet  0  . diltiazem (CARDIZEM CD) 240 MG 24 hr capsule Take 1 capsule (240 mg total) by mouth daily.  90 capsule  1  . docusate sodium 100 MG CAPS Take 100 mg by mouth 2 (two) times daily.  10 capsule  0  . LORazepam (ATIVAN) 0.5 MG tablet Take 1 tablet (0.5 mg total) by mouth 2 (two) times daily as needed for anxiety.  60 tablet  1  . metoprolol tartrate (LOPRESSOR) 25 MG tablet Take 0.5 tablets (12.5 mg total) by mouth 2 (two) times daily.  90 tablet  1  . Multiple Vitamin (MULTIVITAMIN WITH MINERALS) TABS Take 1 tablet by mouth daily.      . phenytoin (DILANTIN) 100 MG ER capsule Take 100 mg by mouth 3 (three) times daily.      . pravastatin (PRAVACHOL) 40 MG tablet TAKE ONE TABLET BY MOUTH EVERY DAY  30 tablet  5  . sennosides-docusate sodium (SENOKOT-S) 8.6-50 MG tablet Take 2 tablets by mouth daily.  30 tablet  1  . warfarin (COUMADIN) 2.5 MG tablet Take 1 tablet (2.5 mg total) by mouth daily.  40 tablet  1  . zolpidem (AMBIEN) 5 MG tablet Take 1 tablet (5 mg total) by mouth at bedtime as needed for sleep.  30 tablet  2  . ranitidine (ZANTAC) 300 MG tablet Take 1 tablet (300 mg total) by mouth at bedtime.  30 tablet  3   No current facility-administered medications on file prior to visit.    Allergies  Allergen Reactions  . Procaine Hcl Anaphylaxis    Family History  Problem Relation Age of Onset  . Diabetes Father   . Heart attack Father  49  . Diabetes Sister   . Cancer Neg Hx     no hx of colon or breast cancer    BP 132/72  Pulse 67  Wt 195 lb (88.451 kg)  BMI 29.66 kg/m2  SpO2 97%  Review of Systems denies hypoglycemia and weight change.      Objective:   Physical Exam VITAL SIGNS:  See vs page.   GENERAL: no distress. SKIN:  Insulin injection sites at the triceps areas are normal    Assessment & Plan:  DM: well-controlled.  This insulin regimen was chosen from multiple options, for its simplicity  The benefits of glycemic control must be weighed against the risks of hypoglycemia.  She needs increased rx. Neuropathy: this limits exercise rx of DM. AF: in this setting, she should avoid hypoglycemia

## 2012-12-02 NOTE — Patient Instructions (Addendum)
Please increase the insulin to 50 units with breakfast.  Please return here in January. check your blood sugar twice a day.  vary the time of day when you check, between before the 3 meals, and at bedtime.  also check if you have symptoms of your blood sugar being too high or too low.  please keep a record of the readings and bring it to your next appointment here.  please call us sooner if your blood sugar goes below 70, or if you have a lot of readings over 200. Your insulin requirement may decrease again.  Call if this happens, so we can decrease the insulin.

## 2012-12-12 ENCOUNTER — Ambulatory Visit (INDEPENDENT_AMBULATORY_CARE_PROVIDER_SITE_OTHER): Payer: Medicare Other | Admitting: *Deleted

## 2012-12-12 DIAGNOSIS — I4891 Unspecified atrial fibrillation: Secondary | ICD-10-CM

## 2012-12-12 DIAGNOSIS — Z7901 Long term (current) use of anticoagulants: Secondary | ICD-10-CM

## 2012-12-12 DIAGNOSIS — I4892 Unspecified atrial flutter: Secondary | ICD-10-CM

## 2012-12-22 ENCOUNTER — Ambulatory Visit (INDEPENDENT_AMBULATORY_CARE_PROVIDER_SITE_OTHER): Payer: Medicare Other | Admitting: *Deleted

## 2012-12-22 DIAGNOSIS — Z7901 Long term (current) use of anticoagulants: Secondary | ICD-10-CM

## 2012-12-22 DIAGNOSIS — I4891 Unspecified atrial fibrillation: Secondary | ICD-10-CM

## 2012-12-22 DIAGNOSIS — I4892 Unspecified atrial flutter: Secondary | ICD-10-CM

## 2012-12-22 LAB — POCT INR: INR: 5.1

## 2012-12-23 ENCOUNTER — Ambulatory Visit: Payer: Medicare Other | Admitting: Endocrinology

## 2012-12-27 ENCOUNTER — Ambulatory Visit (INDEPENDENT_AMBULATORY_CARE_PROVIDER_SITE_OTHER): Payer: Medicare Other | Admitting: *Deleted

## 2012-12-27 DIAGNOSIS — I4892 Unspecified atrial flutter: Secondary | ICD-10-CM

## 2012-12-27 DIAGNOSIS — I4891 Unspecified atrial fibrillation: Secondary | ICD-10-CM

## 2012-12-27 DIAGNOSIS — Z7901 Long term (current) use of anticoagulants: Secondary | ICD-10-CM

## 2012-12-28 ENCOUNTER — Other Ambulatory Visit: Payer: Self-pay | Admitting: Internal Medicine

## 2012-12-28 NOTE — Telephone Encounter (Signed)
rx refille request - Lorazepam 0.5mg  Last OV- 11/24/12 Last refilled- #60 / 1 rf 10/18/12  No UDS on file.  Please advise DJR

## 2013-01-03 ENCOUNTER — Ambulatory Visit (INDEPENDENT_AMBULATORY_CARE_PROVIDER_SITE_OTHER): Payer: Medicare Other | Admitting: General Practice

## 2013-01-03 DIAGNOSIS — I4891 Unspecified atrial fibrillation: Secondary | ICD-10-CM

## 2013-01-03 DIAGNOSIS — Z7901 Long term (current) use of anticoagulants: Secondary | ICD-10-CM

## 2013-01-03 DIAGNOSIS — I4892 Unspecified atrial flutter: Secondary | ICD-10-CM

## 2013-01-04 ENCOUNTER — Telehealth: Payer: Self-pay | Admitting: *Deleted

## 2013-01-04 NOTE — Telephone Encounter (Signed)
LORazepam (ATIVAN) 0.5 MG tablet Last Refill: 10/18/2012 Last OV: 11/24/2012 Needs contract

## 2013-01-05 ENCOUNTER — Other Ambulatory Visit: Payer: Self-pay

## 2013-01-05 ENCOUNTER — Encounter: Payer: Self-pay | Admitting: *Deleted

## 2013-01-05 MED ORDER — LORAZEPAM 0.5 MG PO TABS: 0.5000 mg | ORAL_TABLET | Freq: Two times a day (BID) | ORAL | Status: DC | PRN

## 2013-01-05 NOTE — Telephone Encounter (Signed)
Advise pt to come for a contract and a UDS at her earliest convenience . Rx done

## 2013-01-05 NOTE — Telephone Encounter (Signed)
Contract printed. Patient called and informed that script is ready for pick up at our front desk.

## 2013-01-13 ENCOUNTER — Ambulatory Visit (INDEPENDENT_AMBULATORY_CARE_PROVIDER_SITE_OTHER): Payer: Medicare Other | Admitting: *Deleted

## 2013-01-13 DIAGNOSIS — Z7901 Long term (current) use of anticoagulants: Secondary | ICD-10-CM

## 2013-01-13 DIAGNOSIS — I4891 Unspecified atrial fibrillation: Secondary | ICD-10-CM

## 2013-01-13 DIAGNOSIS — I4892 Unspecified atrial flutter: Secondary | ICD-10-CM

## 2013-01-13 LAB — POCT INR: INR: 2.4

## 2013-01-24 ENCOUNTER — Ambulatory Visit (INDEPENDENT_AMBULATORY_CARE_PROVIDER_SITE_OTHER): Payer: Medicare Other | Admitting: Internal Medicine

## 2013-01-24 ENCOUNTER — Encounter: Payer: Self-pay | Admitting: Internal Medicine

## 2013-01-24 VITALS — BP 127/64 | HR 74 | Temp 97.8°F | Wt 201.0 lb

## 2013-01-24 DIAGNOSIS — I10 Essential (primary) hypertension: Secondary | ICD-10-CM

## 2013-01-24 DIAGNOSIS — G40909 Epilepsy, unspecified, not intractable, without status epilepticus: Secondary | ICD-10-CM

## 2013-01-24 DIAGNOSIS — Z23 Encounter for immunization: Secondary | ICD-10-CM

## 2013-01-24 DIAGNOSIS — N390 Urinary tract infection, site not specified: Secondary | ICD-10-CM

## 2013-01-24 NOTE — Progress Notes (Signed)
Pre visit review using our clinic review tool, if applicable. No additional management support is needed unless otherwise documented below in the visit note. 

## 2013-01-24 NOTE — Assessment & Plan Note (Signed)
BP well controlled, last BMP showed slightly low but stable hyponatremia. No change.

## 2013-01-24 NOTE — Assessment & Plan Note (Signed)
Doing well on Dilantin 100 mg 3 times a day, recheck a Dilantin level

## 2013-01-24 NOTE — Assessment & Plan Note (Signed)
Last urine culture showed no infection despite + WBCs in the urine, patient is asymptomatic, I don't believe she needs antibiotics at this time

## 2013-01-24 NOTE — Assessment & Plan Note (Signed)
Got a flu shot and pneumonia shot today

## 2013-01-24 NOTE — Patient Instructions (Addendum)
Get your blood work before you leave  Next visit in 4 months  for a physical exam. Fasting Please make an appointment

## 2013-01-24 NOTE — Progress Notes (Signed)
  Subjective:    Patient ID: [name] [name], female    DOB: October 20, 1940, 72 y.o.   MRN: [id number]  HPI ROV, here with her husband. In general feeling very good, better than she has felt in a while. Medication list reviewed, good compliance, self discontinue Ambien as she does not need it anymore. In the last office visit, several labs were order, they were okay.  Past Medical History  Diagnosis Date  . Blindness of left eye   . Eye muscle weakness     Right eye weakness after cataract surgery  . Common migraine     ?hx of  . DIABETES MELLITUS, TYPE II 03/27/2006  . HYPERLIPIDEMIA 03/27/2006  . 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 HYPERTENSION 03/27/2006  . Atrial fibrillation     SVT dx 2007, cath 2007 mild CAD, then had an cardioversion, ablation; still on coumadin , has occ palpitation, EKG 03-2010 NSR  . Headache(784.0) 05/07/2009  . LUNG NODULE 09/01/2006    Excision, Bx Benighn  . Osteopenia 2004    Dexa 2004 showed Osteopenia, DEXA 03/2007 normal  . Seizures   . Intertrochanteric fracture of right hip 07/13/2012  . 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 Shoulder surgery    . Femur im nail Right 07/15/2012    Procedure: INTRAMEDULLARY (IM) NAIL HIP;  Surgeon: Johnny Bridge, MD;  Location: [place];  Service: Orthopedics;  Laterality: Right;   History   Social History  . Marital Status: Married    Spouse Name: Jenny Reichmann    Number of Children: 1  . Years of Education: College   Occupational History  .     Social History Main Topics  . Smoking status: Former Smoker    Types: Cigarettes    Quit date: 03/02/1978  . Smokeless tobacco: Never Used  . Alcohol Use: No  . Drug Use: No  . Sexual Activity: No   Other Topics Concern  . Not on file   Social History Narrative   Patient lives at home spouse.   Moved from [place] 2004        Review of Systems No seizure activity No fever or chills No dysuria, gross hematuriar or  difficulty urinating.     Objective:   Physical Exam BP 127/64  Pulse 74  Temp(Src) 97.8 F (36.6 C)  Wt 201 lb (91.173 kg)  SpO2 97% General -- alert, well-developed, NAD.  Lungs -- normal respiratory effort, no intercostal retractions, no accessory muscle use, and normal breath sounds.  Heart-- normal rate, regular rhythm, no murmur.  Extremities-- no pretibial edema bilaterally  Neurologic--  alert & oriented X3. Speech normal. Psych-- Cognition and judgment appear intact. Cooperative with normal attention span and concentration. No anxious appearing , no depressed appearing.      Assessment & Plan:

## 2013-01-25 LAB — PHENYTOIN LEVEL, TOTAL: Phenytoin Lvl: 6.9 ug/mL — ABNORMAL LOW (ref 10.0–20.0)

## 2013-01-28 ENCOUNTER — Other Ambulatory Visit: Payer: Self-pay | Admitting: Internal Medicine

## 2013-01-30 ENCOUNTER — Ambulatory Visit (INDEPENDENT_AMBULATORY_CARE_PROVIDER_SITE_OTHER): Payer: Medicare Other | Admitting: Pharmacist

## 2013-01-30 DIAGNOSIS — Z7901 Long term (current) use of anticoagulants: Secondary | ICD-10-CM

## 2013-01-30 DIAGNOSIS — I4892 Unspecified atrial flutter: Secondary | ICD-10-CM

## 2013-01-30 DIAGNOSIS — I4891 Unspecified atrial fibrillation: Secondary | ICD-10-CM

## 2013-02-02 ENCOUNTER — Other Ambulatory Visit: Payer: Self-pay | Admitting: *Deleted

## 2013-02-02 ENCOUNTER — Telehealth: Payer: Self-pay | Admitting: *Deleted

## 2013-02-02 MED ORDER — LORAZEPAM 0.5 MG PO TABS: 0.5000 mg | ORAL_TABLET | Freq: Two times a day (BID) | ORAL | Status: DC | PRN

## 2013-02-02 NOTE — Telephone Encounter (Signed)
Done. [name] [name]

## 2013-02-02 NOTE — Telephone Encounter (Signed)
Pharmacy called and stated that the patient called very upset because her prescription was denied. Pharmacist was made aware that a prescription was giving to her on 01/05/2013 and that's why it was denied. Pharmacy states that they never received the script for that date and even looked her up in the controlled substance database and nothing was filled for that date. Pharmacy would like to know if it is okay to send another script to get filled. Please advise. SW

## 2013-02-02 NOTE — Telephone Encounter (Signed)
Please send a new prescription.

## 2013-02-03 ENCOUNTER — Ambulatory Visit (INDEPENDENT_AMBULATORY_CARE_PROVIDER_SITE_OTHER): Payer: Medicare Other | Admitting: Endocrinology

## 2013-02-03 ENCOUNTER — Encounter: Payer: Self-pay | Admitting: Endocrinology

## 2013-02-03 VITALS — BP 128/52 | HR 63 | Temp 98.1°F | Ht 68.0 in | Wt 200.0 lb

## 2013-02-03 DIAGNOSIS — E119 Type 2 diabetes mellitus without complications: Secondary | ICD-10-CM

## 2013-02-03 LAB — HEMOGLOBIN A1C: Hgb A1c MFr Bld: 7.7 % — ABNORMAL HIGH (ref 4.6–6.5)

## 2013-02-03 NOTE — Progress Notes (Signed)
Subjective:    Patient ID: [name] [name], female    DOB: 05-13-1940, 72 y.o.   MRN: [id number]  HPI Pt returns for f/u of insulin-requiring DM (dx'ed 1992, on a routine blood test; she has moderate neuropathy of the lower extremities, and associated retinopathy; she has been on insulin since 1990; therapy has been limited by pt's need for a simple, inexpensive insulin regimen; she had an episode of severe hypoglycemia in September, 2013).  Pt says her activity is improving as she recovers from hip fx.  no cbg record, but states cbg's vary from 82-170.  It is in general higher in the afternoon, and lowest in am.   Past Medical History  Diagnosis Date  . Blindness of left eye   . Eye muscle weakness     Right eye weakness after cataract surgery  . Common migraine     ?hx of  . DIABETES MELLITUS, TYPE II 03/27/2006  . HYPERLIPIDEMIA 03/27/2006  . 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 HYPERTENSION 03/27/2006  . Atrial fibrillation     SVT dx 2007, cath 2007 mild CAD, then had an cardioversion, ablation; still on coumadin , has occ palpitation, EKG 03-2010 NSR  . Headache(784.0) 05/07/2009  . LUNG NODULE 09/01/2006    Excision, Bx Benighn  . Osteopenia 2004    Dexa 2004 showed Osteopenia, DEXA 03/2007 normal  . Seizures   . Intertrochanteric fracture of right hip 07/13/2012  . 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 Shoulder surgery    . Femur im nail Right 07/15/2012    Procedure: INTRAMEDULLARY (IM) NAIL HIP;  Surgeon: Johnny Bridge, MD;  Location: [place];  Service: Orthopedics;  Laterality: Right;    History   Social History  . Marital Status: Married    Spouse Name: Jenny Reichmann    Number of Children: 1  . Years of Education: College   Occupational History  .     Social History Main Topics  . Smoking status: Former Smoker    Types: Cigarettes    Quit date: 03/02/1978  . Smokeless tobacco: Never Used  . Alcohol Use: No  . Drug Use:  No  . Sexual Activity: No   Other Topics Concern  . Not on file   Social History Narrative   Patient lives at home spouse.   Moved from [place] 2004        Current Outpatient Prescriptions on File Prior to Visit  Medication Sig Dispense Refill  . albuterol (VENTOLIN HFA) 108 (90 BASE) MCG/ACT inhaler Inhale 2 puffs into the lungs every 6 (six) hours as needed for wheezing.  1 Inhaler  1  . aspirin EC 81 MG tablet Take 81 mg by mouth once a week.       . calcium-vitamin D (OSCAL WITH D) 500-200 MG-UNIT per tablet Take 1 tablet by mouth daily.      . colchicine 0.6 MG tablet Take 1 tablet (0.6 mg total) by mouth 2 (two) times daily.  60 tablet  0  . diltiazem (CARDIZEM CD) 240 MG 24 hr capsule Take 1 capsule (240 mg total) by mouth daily.  90 capsule  1  . docusate sodium 100 MG CAPS Take 100 mg by mouth 2 (two) times daily.  10 capsule  0  . insulin NPH-regular (NOVOLIN 70/30) (70-30) 100 UNIT/ML injection Inject 50 Units into the skin daily with breakfast.      . LORazepam (ATIVAN) 0.5 MG tablet  Take 1 tablet (0.5 mg total) by mouth 2 (two) times daily as needed for anxiety.  60 tablet  1  . metoprolol tartrate (LOPRESSOR) 25 MG tablet Take 0.5 tablets (12.5 mg total) by mouth 2 (two) times daily.  90 tablet  1  . Multiple Vitamin (MULTIVITAMIN WITH MINERALS) TABS Take 1 tablet by mouth daily.      . phenytoin (DILANTIN) 100 MG ER capsule Take 100 mg by mouth 3 (three) times daily.      . pravastatin (PRAVACHOL) 40 MG tablet TAKE ONE TABLET BY MOUTH EVERY DAY  30 tablet  5  . sennosides-docusate sodium (SENOKOT-S) 8.6-50 MG tablet Take 2 tablets by mouth daily.  30 tablet  1  . warfarin (COUMADIN) 2.5 MG tablet Take 1 tablet (2.5 mg total) by mouth daily.  40 tablet  1  . ranitidine (ZANTAC) 300 MG tablet Take 1 tablet (300 mg total) by mouth at bedtime.  30 tablet  3   No current facility-administered medications on file prior to visit.    Allergies  Allergen Reactions  . Procaine Hcl  Anaphylaxis    Family History  Problem Relation Age of Onset  . Diabetes Father   . Heart attack Father 18  . Diabetes Sister   . Cancer Neg Hx     no hx of colon or breast cancer    BP 128/52  Pulse 63  Temp(Src) 98.1 F (36.7 C) (Oral)  Ht 5\' 8"  (1.727 m)  Wt 200 lb (90.719 kg)  BMI 30.42 kg/m2  SpO2 97%  Review of Systems denies hypoglycemia and weight change.      Objective:   Physical Exam VITAL SIGNS:  See vs page GENERAL: no distress  Lab Results  Component Value Date   HGBA1C 7.7* 02/03/2013      Assessment & Plan:  DM: well-controlled.  This insulin regimen was chosen from multiple options, for its simplicity  The benefits of glycemic control must be weighed against the risks of hypoglycemia.  this is the best control this pt should aim for, given this regimen, which does match insulin to her changing needs throughout the day. Neuropathy: this limits exercise rx of DM. AF: in this setting, she should avoid hypoglycemia

## 2013-02-03 NOTE — Patient Instructions (Addendum)
blood tests are being requested for you today.  We'll contact you with results. Please come back for a follow-up appointment in 3 months.   check your blood sugar twice a day.  vary the time of day when you check, between before the 3 meals, and at bedtime.  also check if you have symptoms of your blood sugar being too high or too low.  please keep a record of the readings and bring it to your next appointment here.  please call us sooner if your blood sugar goes below 70, or if you have a lot of readings over 200.

## 2013-02-13 ENCOUNTER — Ambulatory Visit (INDEPENDENT_AMBULATORY_CARE_PROVIDER_SITE_OTHER): Payer: Medicare Other | Admitting: *Deleted

## 2013-02-13 DIAGNOSIS — Z7901 Long term (current) use of anticoagulants: Secondary | ICD-10-CM

## 2013-02-13 DIAGNOSIS — I4892 Unspecified atrial flutter: Secondary | ICD-10-CM

## 2013-02-13 DIAGNOSIS — I4891 Unspecified atrial fibrillation: Secondary | ICD-10-CM

## 2013-02-13 LAB — POCT INR: INR: 2.3

## 2013-02-15 ENCOUNTER — Other Ambulatory Visit: Payer: Self-pay | Admitting: Endocrinology

## 2013-02-16 NOTE — Telephone Encounter (Signed)
Please advise if ok to refill. Medication has been D/C and is not on current medication list. Insulin 70/30 is.  Thanks!

## 2013-02-17 ENCOUNTER — Ambulatory Visit: Payer: Medicare Other | Admitting: Internal Medicine

## 2013-02-22 ENCOUNTER — Other Ambulatory Visit: Payer: Self-pay | Admitting: Endocrinology

## 2013-02-22 ENCOUNTER — Telehealth: Payer: Self-pay

## 2013-02-22 MED ORDER — INSULIN NPH ISOPHANE & REGULAR (70-30) 100 UNIT/ML ~~LOC~~ SUSP: 50.0000 [IU] | Freq: Every day | SUBCUTANEOUS | Status: DC

## 2013-02-22 NOTE — Telephone Encounter (Signed)
Refill for novolin 70/30

## 2013-03-06 ENCOUNTER — Ambulatory Visit (INDEPENDENT_AMBULATORY_CARE_PROVIDER_SITE_OTHER): Payer: Medicare Other | Admitting: Pharmacist

## 2013-03-06 DIAGNOSIS — I4891 Unspecified atrial fibrillation: Secondary | ICD-10-CM

## 2013-03-06 DIAGNOSIS — I4892 Unspecified atrial flutter: Secondary | ICD-10-CM

## 2013-03-06 DIAGNOSIS — Z7901 Long term (current) use of anticoagulants: Secondary | ICD-10-CM

## 2013-03-06 LAB — POCT INR: INR: 1.4

## 2013-03-20 ENCOUNTER — Ambulatory Visit (INDEPENDENT_AMBULATORY_CARE_PROVIDER_SITE_OTHER): Payer: Medicare Other | Admitting: *Deleted

## 2013-03-20 DIAGNOSIS — I4891 Unspecified atrial fibrillation: Secondary | ICD-10-CM

## 2013-03-20 DIAGNOSIS — I4892 Unspecified atrial flutter: Secondary | ICD-10-CM

## 2013-03-20 DIAGNOSIS — Z7901 Long term (current) use of anticoagulants: Secondary | ICD-10-CM

## 2013-03-20 LAB — POCT INR: INR: 1.1

## 2013-03-21 ENCOUNTER — Other Ambulatory Visit: Payer: Self-pay | Admitting: Internal Medicine

## 2013-03-30 ENCOUNTER — Ambulatory Visit (INDEPENDENT_AMBULATORY_CARE_PROVIDER_SITE_OTHER): Payer: Medicare Other | Admitting: Pharmacist

## 2013-03-30 ENCOUNTER — Ambulatory Visit: Payer: Medicare Other | Admitting: Neurology

## 2013-03-30 DIAGNOSIS — I4892 Unspecified atrial flutter: Secondary | ICD-10-CM

## 2013-03-30 DIAGNOSIS — I4891 Unspecified atrial fibrillation: Secondary | ICD-10-CM

## 2013-03-30 DIAGNOSIS — Z7901 Long term (current) use of anticoagulants: Secondary | ICD-10-CM

## 2013-03-30 LAB — POCT INR: INR: 2.2

## 2013-04-13 ENCOUNTER — Ambulatory Visit (INDEPENDENT_AMBULATORY_CARE_PROVIDER_SITE_OTHER): Payer: Medicare Other | Admitting: Pharmacist

## 2013-04-13 DIAGNOSIS — I4892 Unspecified atrial flutter: Secondary | ICD-10-CM

## 2013-04-13 DIAGNOSIS — Z7901 Long term (current) use of anticoagulants: Secondary | ICD-10-CM

## 2013-04-13 DIAGNOSIS — I4891 Unspecified atrial fibrillation: Secondary | ICD-10-CM

## 2013-04-13 LAB — POCT INR: INR: 2.4

## 2013-04-15 ENCOUNTER — Other Ambulatory Visit: Payer: Self-pay | Admitting: Internal Medicine

## 2013-04-24 ENCOUNTER — Other Ambulatory Visit: Payer: Self-pay | Admitting: Endocrinology

## 2013-04-28 ENCOUNTER — Other Ambulatory Visit: Payer: Self-pay | Admitting: Internal Medicine

## 2013-05-05 ENCOUNTER — Encounter: Payer: Self-pay | Admitting: Endocrinology

## 2013-05-05 ENCOUNTER — Ambulatory Visit (INDEPENDENT_AMBULATORY_CARE_PROVIDER_SITE_OTHER): Payer: Medicare Other | Admitting: Endocrinology

## 2013-05-05 ENCOUNTER — Ambulatory Visit (INDEPENDENT_AMBULATORY_CARE_PROVIDER_SITE_OTHER): Payer: Medicare Other

## 2013-05-05 VITALS — BP 108/62 | HR 60 | Temp 97.9°F | Ht 68.0 in | Wt 207.0 lb

## 2013-05-05 DIAGNOSIS — E119 Type 2 diabetes mellitus without complications: Secondary | ICD-10-CM

## 2013-05-05 DIAGNOSIS — I4891 Unspecified atrial fibrillation: Secondary | ICD-10-CM

## 2013-05-05 DIAGNOSIS — Z7901 Long term (current) use of anticoagulants: Secondary | ICD-10-CM

## 2013-05-05 DIAGNOSIS — I4892 Unspecified atrial flutter: Secondary | ICD-10-CM

## 2013-05-05 LAB — HEMOGLOBIN A1C: Hgb A1c MFr Bld: 8.4 % — ABNORMAL HIGH (ref 4.6–6.5)

## 2013-05-05 LAB — POCT INR: INR: 2.5

## 2013-05-05 NOTE — Patient Instructions (Signed)
blood tests are being requested for you today.  We'll contact you with results. Please come back for a follow-up appointment in 3 months.   check your blood sugar twice a day.  vary the time of day when you check, between before the 3 meals, and at bedtime.  also check if you have symptoms of your blood sugar being too high or too low.  please keep a record of the readings and bring it to your next appointment here.  please call us sooner if your blood sugar goes below 70, or if you have a lot of readings over 200.

## 2013-05-05 NOTE — Progress Notes (Signed)
Subjective:    Patient ID: [name], female    DOB: 1940/03/31, 73 y.o.   MRN: [id number]  HPI Pt returns for f/u of insulin-requiring DM (dx'ed 1992, on a routine blood test; she has moderate neuropathy of the lower extremities, and associated retinopathy; she has been on insulin since 1990; therapy has been limited by pt's request for an inexpensive insulin regimen; in 2009, she was qac insulin, but did not comply with this; she had an episode of severe hypoglycemia in September, 2013).  Her activity is still limited by her right hip injury and surgery.  no cbg record, but states cbg's vary from 90-300.  She says most cbg's are in the 100's.  It is in general higher as the day goes on.  Pt says she is dividing her insulin 38 units qam and 12 units qpm.  She says she has to do this, to avoid hypoglycemia during the day.   Past Medical History  Diagnosis Date  . Blindness of left eye   . Eye muscle weakness     Right eye weakness after cataract surgery  . Common migraine     ?hx of  . DIABETES MELLITUS, TYPE II 03/27/2006  . HYPERLIPIDEMIA 03/27/2006  . 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 HYPERTENSION 03/27/2006  . Atrial fibrillation     SVT dx 2007, cath 2007 mild CAD, then had an cardioversion, ablation; still on coumadin , has occ palpitation, EKG 03-2010 NSR  . Headache(784.0) 05/07/2009  . LUNG NODULE 09/01/2006    Excision, Bx Benighn  . Osteopenia 2004    Dexa 2004 showed Osteopenia, DEXA 03/2007 normal  . Seizures   . Intertrochanteric fracture of right hip 07/13/2012  . 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 Shoulder surgery    . Femur im nail Right 07/15/2012    Procedure: INTRAMEDULLARY (IM) NAIL HIP;  Surgeon: Johnny Bridge, MD;  Location: [facility];  Service: Orthopedics;  Laterality: Right;    History   Social History  . Marital Status: Married    Spouse Name: Jenny Reichmann    Number of Children: 1  . Years of Education:  College   Occupational History  .     Social History Main Topics  . Smoking status: Former Smoker    Types: Cigarettes    Quit date: 03/02/1978  . Smokeless tobacco: Never Used  . Alcohol Use: No  . Drug Use: No  . Sexual Activity: No   Other Topics Concern  . Not on file   Social History Narrative   Patient lives at home spouse.   Moved from [place] 2004        Current Outpatient Prescriptions on File Prior to Visit  Medication Sig Dispense Refill  . albuterol (VENTOLIN HFA) 108 (90 BASE) MCG/ACT inhaler Inhale 2 puffs into the lungs every 6 (six) hours as needed for wheezing.  1 Inhaler  1  . aspirin EC 81 MG tablet Take 81 mg by mouth once a week.       . calcium-vitamin D (OSCAL WITH D) 500-200 MG-UNIT per tablet Take 1 tablet by mouth daily.      . colchicine 0.6 MG tablet Take 1 tablet (0.6 mg total) by mouth 2 (two) times daily.  60 tablet  0  . diltiazem (CARDIZEM CD) 240 MG 24 hr capsule TAKE ONE CAPSULE BY MOUTH ONCE DAILY  30 capsule  1  . docusate sodium 100 MG  CAPS Take 100 mg by mouth 2 (two) times daily.  10 capsule  0  . LORazepam (ATIVAN) 0.5 MG tablet Take 1 tablet (0.5 mg total) by mouth 2 (two) times daily as needed for anxiety.  60 tablet  1  . metoprolol tartrate (LOPRESSOR) 25 MG tablet TAKE ONE-HALF TABLET BY MOUTH TWICE DAILY  90 tablet  1  . Multiple Vitamin (MULTIVITAMIN WITH MINERALS) TABS Take 1 tablet by mouth daily.      [name] NOVOLIN 70/30 RELION (70-30) 100 UNIT/ML injection INJECT 50 UNITS INTO THE SKIN ONCE DAILY WITH BREAKFAST  10 mL  2  . phenytoin (DILANTIN) 100 MG ER capsule Take 100 mg by mouth 3 (three) times daily.      . pravastatin (PRAVACHOL) 40 MG tablet TAKE ONE TABLET BY MOUTH ONCE DAILY  30 tablet  2  . sennosides-docusate sodium (SENOKOT-S) 8.6-50 MG tablet Take 2 tablets by mouth daily.  30 tablet  1  . warfarin (COUMADIN) 2.5 MG tablet TAKE ONE TABLET BY MOUTH ONCE DAILY  40 tablet  3  . ranitidine (ZANTAC) 300 MG tablet Take 1 tablet  (300 mg total) by mouth at bedtime.  30 tablet  3   No current facility-administered medications on file prior to visit.    Allergies  Allergen Reactions  . Procaine Hcl Anaphylaxis   Family History  Problem Relation Age of Onset  . Diabetes Father   . Heart attack Father 25  . Diabetes Sister   . Cancer Neg Hx     no hx of colon or breast cancer   BP 108/62  Pulse 60  Temp(Src) 97.9 F (36.6 C) (Oral)  Ht 5\' 8"  (1.727 m)  Wt 207 lb (93.895 kg)  BMI 31.48 kg/m2  SpO2 97%  Review of Systems denies severe hypoglycemia and weight change.      Objective:   Physical Exam VITAL SIGNS:  See vs page GENERAL: no distress  Lab Results  Component Value Date   HGBA1C 8.4* 05/05/2013      Assessment & Plan:  DM: well-controlled.  This insulin regimen was chosen from multiple options, for its simplicity and relatively low cost.  The benefits of glycemic control must be weighed against the risks of hypoglycemia.  She needs increased rx. Right hip injury: this limits exercise rx of DM. AF: in this setting, she should avoid hypoglycemia

## 2013-05-23 ENCOUNTER — Telehealth: Payer: Self-pay

## 2013-05-23 NOTE — Telephone Encounter (Addendum)
Medication and allergies:  Reviewed and updated  90 day supply/mail order: na Local pharmacy: [facility] [address] [place]   Immunizations due:  UTD  A/P:   No changes to FH, PSH or Personal Hx Flu vaccine--12/2012 Tdap--2012 PNA--2014 Shingles--2005 MMG--2011 Bone Density--none CCS--never had  To Discuss with Provider: Not at this time

## 2013-05-24 ENCOUNTER — Encounter: Payer: Self-pay | Admitting: Internal Medicine

## 2013-05-24 ENCOUNTER — Ambulatory Visit (INDEPENDENT_AMBULATORY_CARE_PROVIDER_SITE_OTHER): Payer: Medicare Other | Admitting: Internal Medicine

## 2013-05-24 VITALS — BP 107/58 | HR 62 | Temp 97.9°F | Ht 68.0 in | Wt 209.0 lb

## 2013-05-24 DIAGNOSIS — M949 Disorder of cartilage, unspecified: Secondary | ICD-10-CM

## 2013-05-24 DIAGNOSIS — I10 Essential (primary) hypertension: Secondary | ICD-10-CM

## 2013-05-24 DIAGNOSIS — Z0000 Encounter for general adult medical examination without abnormal findings: Secondary | ICD-10-CM

## 2013-05-24 DIAGNOSIS — G40909 Epilepsy, unspecified, not intractable, without status epilepticus: Secondary | ICD-10-CM

## 2013-05-24 DIAGNOSIS — M899 Disorder of bone, unspecified: Secondary | ICD-10-CM

## 2013-05-24 DIAGNOSIS — E785 Hyperlipidemia, unspecified: Secondary | ICD-10-CM

## 2013-05-24 DIAGNOSIS — K219 Gastro-esophageal reflux disease without esophagitis: Secondary | ICD-10-CM

## 2013-05-24 LAB — COMPREHENSIVE METABOLIC PANEL
ALBUMIN: 3.7 g/dL (ref 3.5–5.2)
ALT: 13 U/L (ref 0–35)
AST: 18 U/L (ref 0–37)
Alkaline Phosphatase: 132 U/L — ABNORMAL HIGH (ref 39–117)
BILIRUBIN TOTAL: 0.4 mg/dL (ref 0.3–1.2)
BUN: 20 mg/dL (ref 6–23)
CALCIUM: 9.3 mg/dL (ref 8.4–10.5)
CHLORIDE: 101 meq/L (ref 96–112)
CO2: 28 meq/L (ref 19–32)
Creatinine, Ser: 1 mg/dL (ref 0.4–1.2)
GFR: 56.71 mL/min — AB (ref >60.00)
GLUCOSE: 73 mg/dL (ref 70–99)
POTASSIUM: 3.8 meq/L (ref 3.5–5.1)
SODIUM: 137 meq/L (ref 135–145)
Total Protein: 7 g/dL (ref 6.0–8.3)

## 2013-05-24 LAB — CBC WITH DIFFERENTIAL/PLATELET
Basophils Absolute: 0 10*3/uL (ref 0.0–0.1)
Basophils Relative: 0.4 % (ref 0.0–3.0)
EOS PCT: 2.6 % (ref 0.0–5.0)
Eosinophils Absolute: 0.2 10*3/uL (ref 0.0–0.7)
HCT: 36.3 % (ref 36.0–46.0)
Hemoglobin: 11.9 g/dL — ABNORMAL LOW (ref 12.0–15.0)
LYMPHS PCT: 26.4 % (ref 12.0–46.0)
Lymphs Abs: 2.2 10*3/uL (ref 0.7–4.0)
MCHC: 32.7 g/dL (ref 30.0–36.0)
MCV: 92.8 fl (ref 78.0–100.0)
MONOS PCT: 10.2 % (ref 3.0–12.0)
Monocytes Absolute: 0.8 10*3/uL (ref 0.1–1.0)
NEUTROS PCT: 60.4 % (ref 43.0–77.0)
Neutro Abs: 4.9 10*3/uL (ref 1.4–7.7)
Platelets: 276 10*3/uL (ref 150.0–400.0)
RBC: 3.91 Mil/uL (ref 3.87–5.11)
RDW: 13.4 % (ref 11.5–14.6)
WBC: 8.2 10*3/uL (ref 4.5–10.5)

## 2013-05-24 LAB — LIPID PANEL
CHOLESTEROL: 203 mg/dL — AB (ref 0–200)
HDL: 68.6 mg/dL (ref >39.00)
LDL CALC: 120 mg/dL — AB (ref 0–99)
TRIGLYCERIDES: 72 mg/dL (ref 0.0–149.0)
Total CHOL/HDL Ratio: 3
VLDL: 14.4 mg/dL (ref 0.0–40.0)

## 2013-05-24 MED ORDER — ZOSTER VACCINE LIVE 19400 UNT/0.65ML ~~LOC~~ SOLR: 0.6500 mL | Freq: Once | SUBCUTANEOUS | Status: DC

## 2013-05-24 NOTE — Patient Instructions (Signed)
Get your blood work before you leave    Next visit is for routine check    in 6 months  No need to come back fasting Please make an appointment        Fall Prevention and [place] cause injuries and can affect all age groups. It is possible to use preventive measures to significantly decrease the likelihood of falls. There are many simple measures which can make your home safer and prevent falls. OUTDOORS  Repair cracks and edges of walkways and driveways.  Remove high doorway thresholds.  Trim shrubbery on the main path into your home.  Have good outside lighting.  Clear walkways of tools, rocks, debris, and clutter.  Check that handrails are not broken and are securely fastened. Both sides of steps should have handrails.  Have leaves, snow, and ice cleared regularly.  Use sand or salt on walkways during winter months.  In the garage, clean up grease or oil spills. BATHROOM  Install night lights.  Install grab bars by the toilet and in the tub and shower.  Use non-skid mats or decals in the tub or shower.  Place a plastic non-slip stool in the shower to sit on, if needed.  Keep floors dry and clean up all water on the floor immediately.  Remove soap buildup in the tub or shower on a regular basis.  Secure bath mats with non-slip, double-sided rug tape.  Remove throw rugs and tripping hazards from the floors. BEDROOMS  Install night lights.  Make sure a bedside light is easy to reach.  Do not use oversized bedding.  Keep a telephone by your bedside.  Have a firm chair with side arms to use for getting dressed.  Remove throw rugs and tripping hazards from the floor. KITCHEN  Keep handles on pots and pans turned toward the center of the stove. Use back burners when possible.  Clean up spills quickly and allow time for drying.  Avoid walking on wet floors.  Avoid hot utensils and knives.  Position shelves so they are not too high or  low.  Place commonly used objects within easy reach.  If necessary, use a sturdy step stool with a grab bar when reaching.  Keep electrical cables out of the way.  Do not use floor polish or wax that makes floors slippery. If you must use wax, use non-skid floor wax.  Remove throw rugs and tripping hazards from the floor. STAIRWAYS  Never leave objects on stairs.  Place handrails on both sides of stairways and use them. Fix any loose handrails. Make sure handrails on both sides of the stairways are as long as the stairs.  Check carpeting to make sure it is firmly attached along stairs. Make repairs to worn or loose carpet promptly.  Avoid placing throw rugs at the top or bottom of stairways, or properly secure the rug with carpet tape to prevent slippage. Get rid of throw rugs, if possible.  Have an electrician put in a light switch at the top and bottom of the stairs. OTHER FALL PREVENTION TIPS  Wear low-heel or rubber-soled shoes that are supportive and fit well. Wear closed toe shoes.  When using a stepladder, make sure it is fully opened and both spreaders are firmly locked. Do not climb a closed stepladder.  Add color or contrast paint or tape to grab bars and handrails in your home. Place contrasting color strips on first and last steps.  Learn and use mobility aids as  needed. Install an electrical emergency response system.  Turn on lights to avoid dark areas. Replace light bulbs that burn out immediately. Get light switches that glow.  Arrange furniture to create clear pathways. Keep furniture in the same place.  Firmly attach carpet with non-skid or double-sided tape.  Eliminate uneven floor surfaces.  Select a carpet pattern that does not visually hide the edge of steps.  Be aware of all pets. OTHER HOME SAFETY TIPS  Set the water temperature for 120 F (48.8 C).  Keep emergency numbers on or near the telephone.  Keep smoke detectors on every level of the  home and near sleeping areas. Document Released: 02/06/2002 Document Revised: 08/18/2011 Document Reviewed: 05/08/2011 [facility] Patient Information 2014 [place].

## 2013-05-24 NOTE — Progress Notes (Signed)
Subjective:    Patient ID: [name] [name], female    DOB: [birth year], 73 y.o.   MRN: [id number]  DOS:  05/24/2013 Type of  Visit:   Here for Medicare AWV:  1. Risk factors based on Past M, S, F history: REVIEWED  2. Physical Activities: has a hart time walking (d/t leg pain from Fx), minor home chores   3. Depression/mood: denies  4. Hearing: normal. no problems noted at normal conversation  5. ADL's: unable to drive d/t poor vision  6. Fall Risk:counseled  7. Home Safety: DOES FEEL SAFE AT HOME  8. Height, weight, &visual acuity: see VS, chronic poor vision, used to see opht. Now  still sees the optometrist  9. Counseling: yes  10. Labs ordered based on risk factors: if needed  11. Referral Coordination, if needed  12. Care Plan, see a/p  13. Cognitive Assessment -- motor skills decreased compared to previous years, memory and cognition seem adequate   in addition, we discussed the following High cholesterol, good compliance with medications, no apparent side effects Hypertension- atrial fibrillation: BP normal today, rate well controlled, on Coumadin Diabetes, followup by endocrinology, she is fasting today, CBG slt low this AM, already took some glucose. 2 months history of pain at the second right knuckle, some swelling  ROS No  CP, SOB No palpitations, no lower extremity edema No orthopnea , DOE Denies  nausea, vomiting diarrhea Denies  blood in the stools (-) cough, sputum production (-) wheezing, chest congestion  No dysuria, gross hematuria, difficulty urinating   No vaginal discharge or bleed      Past Medical History  Diagnosis Date  . Blindness of left eye   . Eye muscle weakness     Right eye weakness after cataract surgery  . Common migraine     ?hx of  . DIABETES MELLITUS, TYPE II 03/27/2006  . HYPERLIPIDEMIA 03/27/2006  . 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 HYPERTENSION 03/27/2006  . Atrial fibrillation     SVT dx 2007, cath 2007 mild CAD,  then had an cardioversion, ablation; still on coumadin , has occ palpitation, EKG 03-2010 NSR  . Headache(784.0) 05/07/2009  . LUNG NODULE 09/01/2006    Excision, Bx Benighn  . Osteopenia 2004    Dexa 2004 showed Osteopenia, DEXA 03/2007 normal  . Seizures   . Intertrochanteric fracture of right hip 07/13/2012  . 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 GERD (gastroesophageal reflux disease) 10/05/2011    Past Surgical History  Procedure Laterality Date  . Shoulder surgery    . Femur im nail Right 07/15/2012    Procedure: INTRAMEDULLARY (IM) NAIL HIP;  Surgeon: Johnny Bridge, MD;  Location: [facility];  Service: Orthopedics;  Laterality: Right;    History   Social History  . Marital Status: Married    Spouse Name: Jenny Reichmann    Number of Children: 1  . Years of Education: College   Occupational History  . retired     Social History Main Topics  . Smoking status: Former Smoker    Types: Cigarettes    Quit date: 03/02/1978  . Smokeless tobacco: Never Used  . Alcohol Use: No  . Drug Use: No  . Sexual Activity: No   Other Topics Concern  . Not on file   Social History Narrative   Patient lives at home spouse.   Moved from [place] 2004         Family History  Problem Relation Age of Onset  . Diabetes  Father   . Heart attack Father 59  . Diabetes Sister   . Cancer Neg Hx     no hx of colon or breast cancer       Medication List       This list is accurate as of: 05/24/13 11:59 PM.  Always use your most recent med list.               albuterol 108 (90 BASE) MCG/ACT inhaler  Commonly known as:  VENTOLIN HFA  Inhale 2 puffs into the lungs every 6 (six) hours as needed for wheezing.     aspirin EC 81 MG tablet  Take 81 mg by mouth once a week.     calcium-vitamin D 500-200 MG-UNIT per tablet  Commonly known as:  OSCAL WITH D  Take 1 tablet by mouth daily.     colchicine 0.6 MG tablet  Take 1 tablet (0.6 mg total) by mouth 2 (two) times daily.     diltiazem 240 MG 24 hr capsule    Commonly known as:  CARDIZEM CD  TAKE ONE CAPSULE BY MOUTH ONCE DAILY     DSS 100 MG Caps  Take 100 mg by mouth 2 (two) times daily.     LORazepam 0.5 MG tablet  Commonly known as:  ATIVAN  Take 1 tablet (0.5 mg total) by mouth 2 (two) times daily as needed for anxiety.     metoprolol tartrate 25 MG tablet  Commonly known as:  LOPRESSOR  TAKE ONE-HALF TABLET BY MOUTH TWICE DAILY     multivitamin with minerals Tabs tablet  Take 1 tablet by mouth daily.     NOVOLIN 70/30 RELION (70-30) 100 UNIT/ML injection  Generic drug:  insulin NPH-regular Human  45 units with breakfast, and 10 units with the evening meal     phenytoin 100 MG ER capsule  Commonly known as:  DILANTIN  Take 100 mg by mouth 3 (three) times daily.     pravastatin 40 MG tablet  Commonly known as:  PRAVACHOL  TAKE ONE TABLET BY MOUTH ONCE DAILY     ranitidine 300 MG tablet  Commonly known as:  ZANTAC  Take 300 mg by mouth at bedtime as needed.     warfarin 2.5 MG tablet  Commonly known as:  COUMADIN  TAKE ONE TABLET BY MOUTH ONCE DAILY     zoster vaccine live (PF) 19400 UNT/0.65ML injection  Commonly known as:  ZOSTAVAX  Inject 19,400 Units into the skin once.           Objective:   Physical Exam BP 107/58  Pulse 62  Temp(Src) 97.9 F (36.6 C)  Ht 5\' 8"  (1.727 m)  Wt 209 lb (94.802 kg)  BMI 31.79 kg/m2  SpO2 96% General -- alert, well-developed, NAD.  Neck --no thyromegaly  HEENT-- Not pale.  Lungs -- normal respiratory effort, no intercostal retractions, no accessory muscle use, and normal breath sounds.  Heart-- normal rate, regular rhythm, no murmur.  Abdomen-- Not distended, good bowel sounds,soft, non-tender.   Extremities-- no pretibial edema bilaterally ; second right knuckle slightly swollen, not red or warm Neurologic--  alert , Reports her memory has definitely decreased but today she is oriented X3 ; R eye ptosis, not new Psych-- Cognition and judgment appear intact. Cooperative  with normal attention span and concentration. No anxious or depressed appearing.      Assessment & Plan:

## 2013-05-24 NOTE — Assessment & Plan Note (Signed)
Asymptomatic, on Dilantin, last level was low, recheck

## 2013-05-24 NOTE — Progress Notes (Signed)
Pre visit review using our clinic review tool, if applicable. No additional management support is needed unless otherwise documented below in the visit note. 

## 2013-05-24 NOTE — Assessment & Plan Note (Signed)
Good compliance of medication, check labs

## 2013-05-24 NOTE — Assessment & Plan Note (Addendum)
Td 2012  PNM 23 2014 explained benefits of shingles shot --- rx provided    last MMG 01-2010 , neg  No recent PAPs, never had an abnormal PAP Patient reluctant to have further screening as, advised her to think about it, schedule a visit with me or a female provider if she decides to proceed. She has multiple comorbidities.  colonoscopy: never had one and is not interested   Diet-exercise discussed

## 2013-05-24 NOTE — Assessment & Plan Note (Signed)
Well controlled, labs  

## 2013-05-24 NOTE — Assessment & Plan Note (Signed)
Essentially asymptomatic, on Tums. Consider restart   Zantac if needed

## 2013-05-24 NOTE — Assessment & Plan Note (Signed)
Does not see gynecology. Check a bone density test

## 2013-05-25 ENCOUNTER — Telehealth: Payer: Self-pay | Admitting: Internal Medicine

## 2013-05-25 LAB — PHENYTOIN LEVEL, TOTAL: Phenytoin Lvl: 3.5 ug/mL — ABNORMAL LOW (ref 10.0–20.0)

## 2013-05-25 NOTE — Telephone Encounter (Signed)
Relevant patient education assigned to patient using Emmi. ° °

## 2013-05-26 ENCOUNTER — Other Ambulatory Visit: Payer: Self-pay | Admitting: Internal Medicine

## 2013-05-28 ENCOUNTER — Telehealth: Payer: Self-pay | Admitting: Internal Medicine

## 2013-05-28 DIAGNOSIS — R748 Abnormal levels of other serum enzymes: Secondary | ICD-10-CM

## 2013-05-28 DIAGNOSIS — G40909 Epilepsy, unspecified, not intractable, without status epilepticus: Secondary | ICD-10-CM

## 2013-05-28 DIAGNOSIS — E785 Hyperlipidemia, unspecified: Secondary | ICD-10-CM

## 2013-05-28 NOTE — Telephone Encounter (Signed)
1. Dilantin level is low, his first Dilantin intoxication. Plan: Increase Dilantin 100 mg ER from one tablet 3 times a day to 4 tablets daily (2 AM -1 pm-1 bedtime) Check levels in one month. Please enter the orders 2. Persistent increase in Alkaline phosphate, no recent liver imaging. Plan: Schedule abdominal ultrasound, dx increased LFTs, please enter orders 3. Cholesterol used to be better. Plan: Watch diet Increase Pravachol 40 mg from one tablet to 1.5 tablets at bedtime. Recheck cholesterol in 6 months when she comes back

## 2013-05-29 ENCOUNTER — Telehealth: Payer: Self-pay | Admitting: *Deleted

## 2013-05-29 MED ORDER — PRAVASTATIN SODIUM 40 MG PO TABS: ORAL_TABLET | ORAL | Status: DC

## 2013-05-29 MED ORDER — LORAZEPAM 0.5 MG PO TABS: 0.5000 mg | ORAL_TABLET | Freq: Two times a day (BID) | ORAL | Status: DC | PRN

## 2013-05-29 NOTE — Telephone Encounter (Signed)
Spoke with patient and reviewed new directions. Advised of the ultrasound to be ordered. Patient states understanding of new directions. Ordered labs, [place] and new Rx for Pravachol.

## 2013-05-29 NOTE — Telephone Encounter (Signed)
Done Please get a UDS if needed

## 2013-05-29 NOTE — Telephone Encounter (Signed)
rx refill- Lorazepam 0.5mg   Last OV- 05/24/13 Last refilled- 02/02/13 #60 / 1 rf

## 2013-05-30 ENCOUNTER — Telehealth: Payer: Self-pay

## 2013-05-30 ENCOUNTER — Ambulatory Visit (HOSPITAL_BASED_OUTPATIENT_CLINIC_OR_DEPARTMENT_OTHER)
Admission: RE | Admit: 2013-05-30 | Discharge: 2013-05-30 | Disposition: A | Discharge status: Home / Self Care | Payer: Medicare Other | Source: Ambulatory Visit | Attending: Internal Medicine | Admitting: Internal Medicine

## 2013-05-30 DIAGNOSIS — R748 Abnormal levels of other serum enzymes: Secondary | ICD-10-CM

## 2013-05-30 DIAGNOSIS — R7989 Other specified abnormal findings of blood chemistry: Secondary | ICD-10-CM | POA: Insufficient documentation

## 2013-05-30 MED ORDER — PHENYTOIN SODIUM EXTENDED 100 MG PO CAPS: 100.0000 mg | ORAL_CAPSULE | Freq: Four times a day (QID) | ORAL | Status: DC

## 2013-05-30 NOTE — Telephone Encounter (Signed)
[facility] [place] [phone] [place] [place] - [address]

## 2013-05-30 NOTE — Telephone Encounter (Signed)
Patient's spouse called to clarify the dosing of Dilantin 100mg . Reviewed instructions. States understanding. Sent new Rx to  [facility] [employer] [facility] [employer].

## 2013-06-02 ENCOUNTER — Ambulatory Visit (INDEPENDENT_AMBULATORY_CARE_PROVIDER_SITE_OTHER): Payer: Medicare Other | Admitting: *Deleted

## 2013-06-02 DIAGNOSIS — Z5181 Encounter for therapeutic drug level monitoring: Secondary | ICD-10-CM

## 2013-06-02 DIAGNOSIS — Z7901 Long term (current) use of anticoagulants: Secondary | ICD-10-CM

## 2013-06-02 DIAGNOSIS — I4892 Unspecified atrial flutter: Secondary | ICD-10-CM

## 2013-06-02 DIAGNOSIS — I4891 Unspecified atrial fibrillation: Secondary | ICD-10-CM

## 2013-06-02 LAB — POCT INR: INR: 1.9

## 2013-06-07 ENCOUNTER — Ambulatory Visit (INDEPENDENT_AMBULATORY_CARE_PROVIDER_SITE_OTHER)
Admission: RE | Admit: 2013-06-07 | Discharge: 2013-06-07 | Disposition: A | Discharge status: Home / Self Care | Payer: Medicare Other | Source: Ambulatory Visit | Attending: Internal Medicine | Admitting: Internal Medicine

## 2013-06-07 DIAGNOSIS — M899 Disorder of bone, unspecified: Secondary | ICD-10-CM

## 2013-06-07 DIAGNOSIS — M949 Disorder of cartilage, unspecified: Principal | ICD-10-CM

## 2013-06-13 ENCOUNTER — Other Ambulatory Visit: Payer: Self-pay | Admitting: Endocrinology

## 2013-06-30 ENCOUNTER — Ambulatory Visit (INDEPENDENT_AMBULATORY_CARE_PROVIDER_SITE_OTHER): Payer: Medicare Other | Admitting: *Deleted

## 2013-06-30 DIAGNOSIS — I4892 Unspecified atrial flutter: Secondary | ICD-10-CM

## 2013-06-30 DIAGNOSIS — Z5181 Encounter for therapeutic drug level monitoring: Secondary | ICD-10-CM

## 2013-06-30 DIAGNOSIS — I4891 Unspecified atrial fibrillation: Secondary | ICD-10-CM

## 2013-06-30 DIAGNOSIS — Z7901 Long term (current) use of anticoagulants: Secondary | ICD-10-CM

## 2013-06-30 LAB — PROTIME-INR: Prothrombin Time: >120 s (ref 9.6–13.1)

## 2013-06-30 LAB — POCT INR: INR: 8

## 2013-06-30 MED ORDER — PHYTONADIONE 5 MG PO TABS: 5.0000 mg | ORAL_TABLET | Freq: Once | ORAL | Status: DC

## 2013-07-04 ENCOUNTER — Telehealth: Payer: Self-pay | Admitting: Pharmacist

## 2013-07-04 ENCOUNTER — Ambulatory Visit (INDEPENDENT_AMBULATORY_CARE_PROVIDER_SITE_OTHER): Payer: Medicare Other | Admitting: *Deleted

## 2013-07-04 DIAGNOSIS — I4892 Unspecified atrial flutter: Secondary | ICD-10-CM

## 2013-07-04 DIAGNOSIS — Z7901 Long term (current) use of anticoagulants: Secondary | ICD-10-CM

## 2013-07-04 DIAGNOSIS — Z5181 Encounter for therapeutic drug level monitoring: Secondary | ICD-10-CM

## 2013-07-04 DIAGNOSIS — I4891 Unspecified atrial fibrillation: Secondary | ICD-10-CM

## 2013-07-04 LAB — POCT INR: INR: 7.5

## 2013-07-04 LAB — PROTIME-INR
INR: 7.1 ratio — AB (ref 0.8–1.0)
Prothrombin Time: 74.9 s (ref 9.6–13.1)

## 2013-07-04 NOTE — Telephone Encounter (Signed)
Pt's husband wanted to know if she needed to take the extra 1/2 tablet  (2.5mg )   of Vitamin K. Instructed that she is not to take the Vitamin K but  she is to hold her coumadin until seen in clinic on Friday and he states understanding.

## 2013-07-04 NOTE — Telephone Encounter (Signed)
New message     Pt was seen today-----talk to the nurse that she saw today.  Have a question about a medication and coumadin

## 2013-07-07 ENCOUNTER — Ambulatory Visit (INDEPENDENT_AMBULATORY_CARE_PROVIDER_SITE_OTHER): Payer: Medicare Other | Admitting: *Deleted

## 2013-07-07 DIAGNOSIS — Z5181 Encounter for therapeutic drug level monitoring: Secondary | ICD-10-CM

## 2013-07-07 DIAGNOSIS — I4891 Unspecified atrial fibrillation: Secondary | ICD-10-CM

## 2013-07-07 LAB — POCT INR: INR: 3

## 2013-07-13 ENCOUNTER — Ambulatory Visit (INDEPENDENT_AMBULATORY_CARE_PROVIDER_SITE_OTHER): Payer: Medicare Other

## 2013-07-13 DIAGNOSIS — Z5181 Encounter for therapeutic drug level monitoring: Secondary | ICD-10-CM

## 2013-07-13 DIAGNOSIS — I4892 Unspecified atrial flutter: Secondary | ICD-10-CM

## 2013-07-13 DIAGNOSIS — I4891 Unspecified atrial fibrillation: Secondary | ICD-10-CM

## 2013-07-13 DIAGNOSIS — Z7901 Long term (current) use of anticoagulants: Secondary | ICD-10-CM

## 2013-07-13 LAB — POCT INR: INR: 2.3

## 2013-07-14 ENCOUNTER — Ambulatory Visit (INDEPENDENT_AMBULATORY_CARE_PROVIDER_SITE_OTHER): Payer: Medicare Other | Admitting: Nurse Practitioner

## 2013-07-14 VITALS — BP 193/78 | HR 60 | Ht 60.0 in | Wt 215.0 lb

## 2013-07-14 DIAGNOSIS — G40209 Localization-related (focal) (partial) symptomatic epilepsy and epileptic syndromes with complex partial seizures, not intractable, without status epilepticus: Secondary | ICD-10-CM

## 2013-07-14 MED ORDER — PHENYTOIN SODIUM EXTENDED 100 MG PO CAPS: 100.0000 mg | ORAL_CAPSULE | Freq: Four times a day (QID) | ORAL | Status: DC

## 2013-07-14 NOTE — Patient Instructions (Signed)
Continue Dilantin 100 mg ER capsule 4 times daily for seizure prophylaxis.  Continue to perform I'm challenging activities such as soduko, crossword puzzles.  Followup in 6 months with Dr. Leonie Man.

## 2013-07-14 NOTE — Progress Notes (Signed)
PATIENT: [name] [name] DOB: 1941/01/09  REASON FOR VISIT: follow up for seizure HISTORY FROM: patient  HISTORY OF PRESENT ILLNESS: Update 07/14/13 (LL):  Since last visit, has struggled with hip pain since surgery, denies falls, ambulates with a cane.  No recurrent seizures, states taking Dilantin 100 ER QID but level last month was only 3.5, she takes 2 in the morning, 1 in the afternoon and 1 at bedtime.  Right hand tremor is stable. MMSE is stable at 26/30.  Update 09/27/12 (LL): Patient comes in for 6 month follow up for seizures. Doing well, had fractured right hip recently from fall in yard, status post surgery 07/15/12. Released from rehab to home 3 weeks ago. Ambulating with cane, doing well. No further seizures since initial episode. She is on Dilantin 150 mg 3 times a day and seems to the tolerating it well without any dizziness, imbalance, diplopia or dysarthria. Thinks that memory is stable, only notable trouble is remembering numbers. Still has mild tremor of the right hand, reports it is better with Ativan. No new complaints.   Update January 21st 2014 (PS): She returns for followup after last visit on 12/11/2011. She continues to do well without any recurrent seizures following the one during her initial hospitalization in March 2013. She is on Dilantin 150 mg 3 times a day and seems to the tolerating it well without any dizziness, imbalance, diplopia or dysarthria. She continues to have mild short-term memory difficulties which are mainly related to her new memory. She can remember remote incidents quite well. She also complains of mild action tremor of the right hand but it has not been disabling.  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 She also has mild diabetic peripheral neuropathy. Overall doing well. One  syncopal episode related to hypoglycemia.   REVIEW OF SYSTEMS: Full 14 system review of systems performed and notable only for:  Loss of vision   ALLERGIES: Allergies  Allergen Reactions  . Procaine Hcl Anaphylaxis    HOME MEDICATIONS: Outpatient Prescriptions Prior to Visit  Medication Sig Dispense Refill  . albuterol (VENTOLIN HFA) 108 (90 BASE) MCG/ACT inhaler Inhale 2 puffs into the lungs every 6 (six) hours as needed for wheezing.  1 Inhaler  1  . aspirin EC 81 MG tablet Take 81 mg by mouth once a week.       . calcium-vitamin D (OSCAL WITH D) 500-200 MG-UNIT per tablet Take 1 tablet by mouth daily.      . colchicine 0.6 MG tablet Take 1 tablet (0.6 mg total) by mouth 2 (two) times daily.  60 tablet  0  . diltiazem (CARDIZEM CD) 240 MG 24 hr capsule TAKE ONE CAPSULE BY MOUTH ONCE DAILY  30 capsule  1  . docusate sodium 100 MG CAPS Take 100 mg by mouth 2 (two) times daily.  10 capsule  0  . insulin NPH-regular Human (NOVOLIN 70/30 RELION) (70-30) 100 UNIT/ML injection 45 units with breakfast, and 10 units with the evening meal      . LORazepam (ATIVAN) 0.5 MG tablet Take 1 tablet (0.5 mg total) by mouth 2 (two) times daily as needed for anxiety.  60 tablet  2  . metoprolol tartrate (LOPRESSOR) 25 MG tablet TAKE ONE-HALF TABLET BY MOUTH TWICE DAILY  90 tablet  1  . Multiple Vitamin (MULTIVITAMIN WITH MINERALS) TABS Take  1 tablet by mouth daily.      [name] NOVOLIN 70/30 RELION (70-30) 100 UNIT/ML injection INJECT 50 UNITS ONCE DAILY INTO THE SKIN WITH BREAKFAST  2 vial  2  . phytonadione (MEPHYTON) 5 MG tablet Take 1 tablet (5 mg total) by mouth once. Take 1/2 tablet by mouth now  1 tablet  0  . pravastatin (PRAVACHOL) 40 MG tablet Take 1 1/2 tablets daily at bedtime  45 tablet  2  . warfarin (COUMADIN) 2.5 MG tablet TAKE ONE TABLET BY MOUTH ONCE DAILY  40 tablet  3  . zoster vaccine live, PF, (ZOSTAVAX) 13086 UNT/0.65ML injection Inject 19,400 Units into the skin once.  1 each  0  .  phenytoin (DILANTIN) 100 MG ER capsule Take 1 capsule (100 mg total) by mouth 4 (four) times daily.  120 capsule  2   No facility-administered medications prior to visit.     PHYSICAL EXAM  Filed Vitals:   07/14/13 1506  BP: 193/78  Pulse: 60  Height: 5' (1.524 m)  Weight: 215 lb (97.523 kg)   Body mass index is 41.99 kg/(m^2). No exam data present   Generalized: In no acute distress, very pleasant Caucasian female  Neck: Supple, no carotid bruits  Cardiac: Regular rate rhythm, no murmur  Pulmonary: Clear to auscultation bilaterally  Musculoskeletal: No deformity   Neurological examination  Mentation: Alert oriented to time, place, history taking, language fluent, and casual conversation. MMSE 26/30, AFT 5, Clock drawing 3/4. (MMSE 25/30, AFT 6, Clock drawing 3/4 ALL LAST VISIT).  Cranial nerve II-XII: Pupils were equal round reactive to light extraocular movements were full, LEGALLY BLIND L EYE. R EYE PTOSIS. Facial sensation and strength were normal. hearing was intact to finger rubbing bilaterally. Uvula tongue midline. head turning and shoulder shrug and were normal and symmetric.Tongue protrusion into cheek strength was normal.  MOTOR: normal bulk and tone, full strength in the BUE, decreased strength 4/5 hip flexion RLE (due to hip surgery) , LLE 5/5 strength. fine finger movements normal, no pronator drift. Fine action and resting tremor of right hand. No cogwheel rigidity or bradykinesia.  SENSORY: normal and symmetric to light touch Vibration and position impaired over toes.  COORDINATION: finger-nose-finger, heel-to-shin bilaterally, there was no truncal ataxia  REFLEXES: Brachioradialis 2/2, biceps 2/2, triceps 2/2, patellar 1/1, Achilles trace/trace  GAIT/STATION: Rising up from seated position without assistance, wide stance, without trunk ataxia, slow but steady athralgic gait with cane. Unable to perform tiptoe, heel walking or tandem.   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 She also has mild diabetic peripheral neuropathy. Overall doing well.   Continue Dilantin 100 mg ER capsule 4 times daily for seizure prophylaxis.  Continue to perform I'm challenging activities such as soduko, crossword puzzles. She is not driving and has not in the last 15 years secondary to legally blind in left eye.  Followup in 6 months.   Meds ordered this encounter  Medications  . phenytoin (DILANTIN) 100 MG ER capsule    Sig: Take 1 capsule (100 mg total) by mouth 4 (four) times daily.    Dispense:  120 capsule    Refill:  5    Order Specific Question:  Supervising Provider    Answer:  Garvin Fila [[id number]]   Return in about 6 months (around 01/14/2014) for seizure.  Devonna Oboyle E.  Cionna Collantes, MSN, NP-C 07/14/2013, 3:55 PM [facility] [address] [place], [place] [address] [phone]  Note: This document was prepared with digital dictation and possible smart phrase technology. Any transcriptional errors that result from this process are unintentional.

## 2013-07-19 ENCOUNTER — Other Ambulatory Visit: Payer: Self-pay | Admitting: Internal Medicine

## 2013-07-27 ENCOUNTER — Telehealth: Payer: Self-pay | Admitting: Pharmacist Clinician (PhC)/ Clinical Pharmacy Specialist

## 2013-07-27 ENCOUNTER — Ambulatory Visit (INDEPENDENT_AMBULATORY_CARE_PROVIDER_SITE_OTHER): Payer: Medicare Other | Admitting: *Deleted

## 2013-07-27 DIAGNOSIS — Z5181 Encounter for therapeutic drug level monitoring: Secondary | ICD-10-CM

## 2013-07-27 DIAGNOSIS — I4891 Unspecified atrial fibrillation: Secondary | ICD-10-CM

## 2013-07-27 DIAGNOSIS — I4892 Unspecified atrial flutter: Secondary | ICD-10-CM

## 2013-07-27 DIAGNOSIS — Z7901 Long term (current) use of anticoagulants: Secondary | ICD-10-CM

## 2013-07-27 LAB — POCT INR: INR: 8

## 2013-07-27 LAB — PROTIME-INR
INR: 8.3 ratio (ref 0.8–1.0)
Prothrombin Time: 86.4 s (ref 9.6–13.1)

## 2013-07-27 NOTE — Telephone Encounter (Signed)
INR by lab draw 8.25, pt has no signs/symptoms of bleed.  Advised pt to hold dose x 4 days, repeat INR Monday.  Stressed need to go to ER if traumatic injury/bleeding concerns.  Pt voiced understanding

## 2013-07-31 ENCOUNTER — Ambulatory Visit (INDEPENDENT_AMBULATORY_CARE_PROVIDER_SITE_OTHER): Payer: Medicare Other | Admitting: Pharmacist

## 2013-07-31 DIAGNOSIS — I4892 Unspecified atrial flutter: Secondary | ICD-10-CM

## 2013-07-31 DIAGNOSIS — Z7901 Long term (current) use of anticoagulants: Secondary | ICD-10-CM

## 2013-07-31 DIAGNOSIS — Z5181 Encounter for therapeutic drug level monitoring: Secondary | ICD-10-CM

## 2013-07-31 DIAGNOSIS — I4891 Unspecified atrial fibrillation: Secondary | ICD-10-CM

## 2013-07-31 LAB — POCT INR: INR: 2.4

## 2013-08-08 ENCOUNTER — Ambulatory Visit (INDEPENDENT_AMBULATORY_CARE_PROVIDER_SITE_OTHER): Payer: Medicare Other | Admitting: Interventional Cardiology

## 2013-08-08 DIAGNOSIS — Z5181 Encounter for therapeutic drug level monitoring: Secondary | ICD-10-CM

## 2013-08-08 DIAGNOSIS — Z7901 Long term (current) use of anticoagulants: Secondary | ICD-10-CM

## 2013-08-08 DIAGNOSIS — I4891 Unspecified atrial fibrillation: Secondary | ICD-10-CM

## 2013-08-08 DIAGNOSIS — I4892 Unspecified atrial flutter: Secondary | ICD-10-CM

## 2013-08-08 LAB — POCT INR: INR: 1.4

## 2013-08-17 ENCOUNTER — Other Ambulatory Visit: Payer: Self-pay | Admitting: Internal Medicine

## 2013-08-22 ENCOUNTER — Ambulatory Visit (INDEPENDENT_AMBULATORY_CARE_PROVIDER_SITE_OTHER): Payer: Medicare Other | Admitting: Pharmacist

## 2013-08-22 DIAGNOSIS — I4892 Unspecified atrial flutter: Secondary | ICD-10-CM

## 2013-08-22 DIAGNOSIS — I4891 Unspecified atrial fibrillation: Secondary | ICD-10-CM

## 2013-08-22 DIAGNOSIS — Z5181 Encounter for therapeutic drug level monitoring: Secondary | ICD-10-CM

## 2013-08-22 DIAGNOSIS — Z7901 Long term (current) use of anticoagulants: Secondary | ICD-10-CM

## 2013-08-22 LAB — PROTIME-INR
INR: 8.5 ratio (ref 0.8–1.0)
Prothrombin Time: 89 s (ref 9.6–13.1)

## 2013-08-22 LAB — POCT INR: INR: >8

## 2013-08-23 ENCOUNTER — Encounter: Payer: Self-pay | Admitting: Neurology

## 2013-08-28 ENCOUNTER — Ambulatory Visit (INDEPENDENT_AMBULATORY_CARE_PROVIDER_SITE_OTHER): Payer: Medicare Other | Admitting: *Deleted

## 2013-08-28 DIAGNOSIS — Z5181 Encounter for therapeutic drug level monitoring: Secondary | ICD-10-CM

## 2013-08-28 DIAGNOSIS — I4892 Unspecified atrial flutter: Secondary | ICD-10-CM

## 2013-08-28 DIAGNOSIS — Z7901 Long term (current) use of anticoagulants: Secondary | ICD-10-CM

## 2013-08-28 DIAGNOSIS — I4891 Unspecified atrial fibrillation: Secondary | ICD-10-CM

## 2013-08-28 LAB — POCT INR: INR: 2.1

## 2013-09-04 ENCOUNTER — Ambulatory Visit (INDEPENDENT_AMBULATORY_CARE_PROVIDER_SITE_OTHER): Payer: Medicare Other

## 2013-09-04 ENCOUNTER — Telehealth: Payer: Self-pay | Admitting: Internal Medicine

## 2013-09-04 DIAGNOSIS — I4891 Unspecified atrial fibrillation: Secondary | ICD-10-CM

## 2013-09-04 DIAGNOSIS — Z7901 Long term (current) use of anticoagulants: Secondary | ICD-10-CM

## 2013-09-04 DIAGNOSIS — I4892 Unspecified atrial flutter: Secondary | ICD-10-CM

## 2013-09-04 DIAGNOSIS — Z5181 Encounter for therapeutic drug level monitoring: Secondary | ICD-10-CM

## 2013-09-04 LAB — POCT INR: INR: 4.6

## 2013-09-04 NOTE — Telephone Encounter (Signed)
Spoke with patient who has multiple questions regarding her coumadin and INR levels. I reviewed the EMR and saw that the patient's INR fluctuates greatly, she is high again today. Reviewed today's instructions with the patient and advised that someone form the coumadin clinic would call her back.

## 2013-09-04 NOTE — Telephone Encounter (Signed)
Caller name: [place] Relation to pt: Call back [phone]   Reason for call:   Pt has a question about a medication that she is currently taking; coumadin clinic thinks the medication they are taking for stroke is interfering.  Please call back.

## 2013-09-05 ENCOUNTER — Other Ambulatory Visit: Payer: Self-pay | Admitting: Internal Medicine

## 2013-09-05 NOTE — Telephone Encounter (Signed)
Gay Filler I agree with you assessment , thank you.

## 2013-09-05 NOTE — Telephone Encounter (Signed)
Spoke with pt.  She is frustrated with coming in every week.  Explained with her elevated INRs we have to keep a close eye on her labs.  Pt states she does not do anything different from a lifestyle perspective but no other cause can be identified for extremely elevated INRs (INR >7 four times in the past 2 months) I have looked into a NOAC but she is on phenytoin, which prevents use of a NOAC.  I have sent a message to her neurologist to see if there is any possibility of changing this to help regulate her anticoagulation more effectively.  She states she is no longer coming every week- only once a month.  I explained the risks of doing this and requested she keep her appt next week.  She states she will but was not happy about it.  Will send to Dr. Larose Kells as Juluis Rainier.  If she starts to refuse visits, will need to discuss risk/benefit of continuing anticoagulation.

## 2013-09-15 ENCOUNTER — Telehealth: Payer: Self-pay

## 2013-09-15 DIAGNOSIS — E118 Type 2 diabetes mellitus with unspecified complications: Secondary | ICD-10-CM

## 2013-09-15 DIAGNOSIS — E785 Hyperlipidemia, unspecified: Secondary | ICD-10-CM

## 2013-09-15 NOTE — Telephone Encounter (Signed)
Diabetic bundle  mychart message sent  Pt needs lipid and A1C drawn and an appt for nurse visit to recheck BP  Lab order placed

## 2013-09-18 ENCOUNTER — Ambulatory Visit (INDEPENDENT_AMBULATORY_CARE_PROVIDER_SITE_OTHER): Payer: Medicare Other | Admitting: *Deleted

## 2013-09-18 DIAGNOSIS — Z7901 Long term (current) use of anticoagulants: Secondary | ICD-10-CM

## 2013-09-18 DIAGNOSIS — Z5181 Encounter for therapeutic drug level monitoring: Secondary | ICD-10-CM

## 2013-09-18 DIAGNOSIS — I4891 Unspecified atrial fibrillation: Secondary | ICD-10-CM

## 2013-09-18 DIAGNOSIS — I4892 Unspecified atrial flutter: Secondary | ICD-10-CM

## 2013-09-18 LAB — POCT INR: INR: 3.6

## 2013-09-20 ENCOUNTER — Telehealth: Payer: Self-pay | Admitting: Internal Medicine

## 2013-09-20 ENCOUNTER — Encounter: Payer: Self-pay | Admitting: *Deleted

## 2013-09-20 ENCOUNTER — Other Ambulatory Visit: Payer: Self-pay | Admitting: Internal Medicine

## 2013-09-20 NOTE — Telephone Encounter (Signed)
Send a letter:  To whom it may concern: [name] [name] is a patient of mine, she has a number of health problems including diabetes, hypertension, poor vision. 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 In my professional opinion it  will be very taxing for her to do her jury duty. Please let us know if we can provide more information

## 2013-09-20 NOTE — Telephone Encounter (Signed)
Pt notified of letter pick up .

## 2013-09-20 NOTE — Telephone Encounter (Signed)
Caller name: [name]  Relation to [phone] Call back number:[phone]   Reason for call:   Pt is needing a letter to get out of jury duty; the letter just needs to state her current medical concerns; stroke, broke hip.  [name] [name] Duty date is in September.

## 2013-09-22 ENCOUNTER — Other Ambulatory Visit: Payer: Self-pay | Admitting: Endocrinology

## 2013-10-02 ENCOUNTER — Ambulatory Visit (INDEPENDENT_AMBULATORY_CARE_PROVIDER_SITE_OTHER): Payer: Medicare Other | Admitting: Pharmacist

## 2013-10-02 DIAGNOSIS — I4892 Unspecified atrial flutter: Secondary | ICD-10-CM

## 2013-10-02 DIAGNOSIS — Z7901 Long term (current) use of anticoagulants: Secondary | ICD-10-CM

## 2013-10-02 DIAGNOSIS — I4891 Unspecified atrial fibrillation: Secondary | ICD-10-CM

## 2013-10-02 DIAGNOSIS — Z5181 Encounter for therapeutic drug level monitoring: Secondary | ICD-10-CM

## 2013-10-02 LAB — POCT INR: INR: 2.9

## 2013-10-05 ENCOUNTER — Encounter: Payer: Self-pay | Admitting: Internal Medicine

## 2013-10-05 ENCOUNTER — Ambulatory Visit (INDEPENDENT_AMBULATORY_CARE_PROVIDER_SITE_OTHER): Payer: Medicare Other | Admitting: Internal Medicine

## 2013-10-05 VITALS — BP 155/70 | HR 60 | Temp 97.9°F | Wt 221.5 lb

## 2013-10-05 DIAGNOSIS — R55 Syncope and collapse: Secondary | ICD-10-CM

## 2013-10-05 DIAGNOSIS — G40909 Epilepsy, unspecified, not intractable, without status epilepticus: Secondary | ICD-10-CM

## 2013-10-05 DIAGNOSIS — E118 Type 2 diabetes mellitus with unspecified complications: Secondary | ICD-10-CM

## 2013-10-05 NOTE — Assessment & Plan Note (Addendum)
Recent episode of loss of consciousness likely due to hypoglycemia. Currently 70/30 dose is: 38 u and 22 u CBGs are low sometimes in the morning, occasionally 90. Plan: Decrease PM  insulin dose to 18 Discuss  with Dr. [name] next week  carry  sugar with her at all times.

## 2013-10-05 NOTE — Patient Instructions (Addendum)
Same insulin in the morning Decrease the afternoon insulin to 18 Talk with Dr [name] next week

## 2013-10-05 NOTE — Assessment & Plan Note (Signed)
Patient is concerned about interaction between Dilantin and Coumadin, recommend to discuss neurology

## 2013-10-05 NOTE — Progress Notes (Signed)
Subjective:    Patient ID: [name] [name], female    DOB: Nov 05, 1940, 73 y.o.   MRN: [id number]  DOS:  10/05/2013 Type of visit - description: acute History: 2 days ago, she was sitting watching TV early in the afternoon and she suddenly passed out, episode was witnessed by the husband, he was able to wake her up within 10 seconds but she was "in and out" for 3 minutes. EMS arrived, shortly after they suggested to get orange juice, then they checked her blood  sugar and it was 46, subsequently blood sugar was 60 and then 92. She started to improve after she took OJ. On further questioning, that day she took her normal doses of insulin but was very active, more than usual, she cleaned "the entire house". That day, she did not have any headache, chest pain or palpitations. The husband did not notice any seizure activity, bladder or bowel incontinence. No further events. CBGs are sometimes low in the morning, see assessment and plan. Also concerned about Dilantin, see assessment and plan.  ROS See  HPI  Past Medical History  Diagnosis Date  . Blindness of left eye   . Eye muscle weakness     Right eye weakness after cataract surgery  . Common migraine     ?hx of  . DIABETES MELLITUS, TYPE II 03/27/2006  . HYPERLIPIDEMIA 03/27/2006  . 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 HYPERTENSION 03/27/2006  . Atrial fibrillation     SVT dx 2007, cath 2007 mild CAD, then had an cardioversion, ablation; still on coumadin , has occ palpitation, EKG 03-2010 NSR  . Headache(784.0) 05/07/2009  . LUNG NODULE 09/01/2006    Excision, Bx Benighn  . Osteopenia 2004    Dexa 2004 showed Osteopenia, DEXA 03/2007 normal  . Seizures   . Intertrochanteric fracture of right hip 07/13/2012  . 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 GERD (gastroesophageal reflux disease) 10/05/2011    Past Surgical History  Procedure Laterality Date  . Shoulder surgery    . Femur im nail Right 07/15/2012    Procedure: INTRAMEDULLARY  (IM) NAIL HIP;  Surgeon: Johnny Bridge, MD;  Location: [facility];  Service: Orthopedics;  Laterality: Right;    History   Social History  . Marital Status: Married    Spouse Name: Jenny Reichmann    Number of Children: 1  . Years of Education: College   Occupational History  . retired     Social History Main Topics  . Smoking status: Former Smoker    Types: Cigarettes    Quit date: 03/02/1978  . Smokeless tobacco: Never Used  . Alcohol Use: No  . Drug Use: No  . Sexual Activity: No   Other Topics Concern  . Not on file   Social History Narrative   Patient lives at home spouse.   Moved from [place] 2004            Medication List       This list is accurate as of: 10/05/13  3:18 PM.  Always use your most recent med list.               albuterol 108 (90 BASE) MCG/ACT inhaler  Commonly known as:  VENTOLIN HFA  Inhale 2 puffs into the lungs every 6 (six) hours as needed for wheezing.     aspirin EC 81 MG tablet  Take 81 mg by mouth once a week.     calcium-vitamin D 500-200 MG-UNIT per tablet  Commonly  known as:  OSCAL WITH D  Take 1 tablet by mouth daily.     colchicine 0.6 MG tablet  Take 1 tablet (0.6 mg total) by mouth 2 (two) times daily.     diltiazem 240 MG 24 hr capsule  Commonly known as:  CARDIZEM CD  TAKE ONE CAPSULE BY MOUTH ONCE DAILY     DSS 100 MG Caps  Take 100 mg by mouth 2 (two) times daily.     LORazepam 0.5 MG tablet  Commonly known as:  ATIVAN  Take 1 tablet (0.5 mg total) by mouth 2 (two) times daily as needed for anxiety.     metoprolol tartrate 25 MG tablet  Commonly known as:  LOPRESSOR  TAKE ONE-HALF TABLET BY MOUTH TWICE DAILY     multivitamin with minerals Tabs tablet  Take 1 tablet by mouth daily.     NOVOLIN 70/30 RELION (70-30) 100 UNIT/ML injection  Generic drug:  insulin NPH-regular Human  38 units with breakfast, and 18 units with the evening meal     phenytoin 100 MG ER capsule  Commonly known as:  DILANTIN  TAKE ONE CAPSULE  BY MOUTH 4 TIMES DAILY     phytonadione 5 MG tablet  Commonly known as:  MEPHYTON  Take 1 tablet (5 mg total) by mouth once. Take 1/2 tablet by mouth now     pravastatin 40 MG tablet  Commonly known as:  PRAVACHOL  Take 1 1/2 tablets daily at bedtime     warfarin 2.5 MG tablet  Commonly known as:  COUMADIN  Take as directed by anticoagulation clinic           Objective:   Physical Exam BP 155/70  Pulse 60  Temp(Src) 97.9 F (36.6 C) (Oral)  Wt 221 lb 8 oz (100.472 kg)  SpO2 98% General -- alert, well-developed, NAD.   HEENT-- Not pale.  Lungs -- normal respiratory effort, no intercostal retractions, no accessory muscle use, and normal breath sounds.  Heart-- normal rate, regular rhythm, no murmur.  Neurologic--  alert & oriented X3. Speech normal, gait appropriate for age, strength symmetric and appropriate for age.  Psych-- Cognition and judgment appear intact. Cooperative with normal attention span and concentration. No anxious or depressed appearing.       Assessment & Plan:  Syncope, 73 year old lady with multiple cardiovascular risk factors, on Coumadin, history of seizure ---------------> had a syncopal episode 2 days ago. By history this is most likely hypoglycemia, at this point I don't think further eval is necessary but will adjust her insulin. See comments under diabetes  Today , I spent more than 25   min with the patient: >50% of the time counseling regards  Hypoglycemia treatment and getting a detailed history

## 2013-10-09 ENCOUNTER — Telehealth: Payer: Self-pay

## 2013-10-09 NOTE — Telephone Encounter (Signed)
Pt scheduled coming tomorrow at 1 pm.

## 2013-10-09 NOTE — Telephone Encounter (Addendum)
Pt's husband called requesting an appointment. Pt went to see Dr. Larose Kells on 8/6 and he stated to her she needed to follow up this week concerning her blood sugars. Pt's husband informed that schedule is full this week, but he would like to see if there was a day she could be added. Please advise, Thanks!

## 2013-10-09 NOTE — Telephone Encounter (Signed)
1 pm tomorow

## 2013-10-10 ENCOUNTER — Ambulatory Visit (INDEPENDENT_AMBULATORY_CARE_PROVIDER_SITE_OTHER): Payer: Medicare Other | Admitting: Endocrinology

## 2013-10-10 ENCOUNTER — Encounter: Payer: Self-pay | Admitting: Endocrinology

## 2013-10-10 VITALS — BP 122/62 | HR 62 | Temp 97.9°F | Ht 60.0 in | Wt 221.0 lb

## 2013-10-10 DIAGNOSIS — E1049 Type 1 diabetes mellitus with other diabetic neurological complication: Secondary | ICD-10-CM

## 2013-10-10 LAB — HEMOGLOBIN A1C: Hgb A1c MFr Bld: 7.2 % — ABNORMAL HIGH (ref 4.6–6.5)

## 2013-10-10 MED ORDER — GLUCAGON (RDNA) 1 MG IJ KIT: 1.0000 mg | PACK | Freq: Once | INTRAMUSCULAR | Status: DC | PRN

## 2013-10-10 NOTE — Progress Notes (Signed)
Subjective:    Patient ID: [name] [name], female    DOB: 18-Nov-1940, 73 y.o.   MRN: [id number]  HPI Pt returns for f/u of insulin-requiring DM (dx'ed 1992, on a routine blood test; she has moderate neuropathy of the lower extremities, and associated retinopathy; she has been on insulin since 1990; therapy has been limited by pt's request for an inexpensive insulin regimen; in 2009, she was on qac insulin, but did not comply with this; her last previous episode of severe hypoglycemia was in September, 2013; she has never had pancreatitis, severe hypoglycemia, or DKA).  Her activity is still limited by her right hip injury and surgery, but is improved.  Last week, she had another episode of severe hypoglycemia.  This happened at 10 AM.  She had taken her usual 45 units of insulin that am, and had eaten breakfast.  She saw Dr Larose Kells, who decreased the insulin to 38 units qam, and 18 units qpm.  no cbg record, but states since the insulin reduction, cbg's vary from 96-150.  It is in general higher as the day goes on. Past Medical History  Diagnosis Date  . Blindness of left eye   . Eye muscle weakness     Right eye weakness after cataract surgery  . Common migraine     ?hx of  . DIABETES MELLITUS, TYPE II 03/27/2006  . HYPERLIPIDEMIA 03/27/2006  . 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 HYPERTENSION 03/27/2006  . Atrial fibrillation     SVT dx 2007, cath 2007 mild CAD, then had an cardioversion, ablation; still on coumadin , has occ palpitation, EKG 03-2010 NSR  . Headache(784.0) 05/07/2009  . LUNG NODULE 09/01/2006    Excision, Bx Benighn  . Osteopenia 2004    Dexa 2004 showed Osteopenia, DEXA 03/2007 normal  . Seizures   . Intertrochanteric fracture of right hip 07/13/2012  . 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 GERD (gastroesophageal reflux disease) 10/05/2011    Past Surgical History  Procedure Laterality Date  . Shoulder surgery    . Femur im nail Right 07/15/2012    Procedure: INTRAMEDULLARY  (IM) NAIL HIP;  Surgeon: Johnny Bridge, MD;  Location: [facility];  Service: Orthopedics;  Laterality: Right;    History   Social History  . Marital Status: Married    Spouse Name: Jenny Reichmann    Number of Children: 1  . Years of Education: College   Occupational History  . retired     Social History Main Topics  . Smoking status: Former Smoker    Types: Cigarettes    Quit date: 03/02/1978  . Smokeless tobacco: Never Used  . Alcohol Use: No  . Drug Use: No  . Sexual Activity: No   Other Topics Concern  . Not on file   Social History Narrative   Patient lives at home spouse.   Moved from [place] 2004        Current Outpatient Prescriptions on File Prior to Visit  Medication Sig Dispense Refill  . albuterol (VENTOLIN HFA) 108 (90 BASE) MCG/ACT inhaler Inhale 2 puffs into the lungs every 6 (six) hours as needed for wheezing.  1 Inhaler  1  . aspirin EC 81 MG tablet Take 81 mg by mouth once a week.       . calcium-vitamin D (OSCAL WITH D) 500-200 MG-UNIT per tablet Take 1 tablet by mouth daily.      . colchicine 0.6 MG tablet Take 1 tablet (0.6 mg total) by mouth 2 (two)  times daily.  60 tablet  0  . diltiazem (CARDIZEM CD) 240 MG 24 hr capsule TAKE ONE CAPSULE BY MOUTH ONCE DAILY  30 capsule  6  . docusate sodium 100 MG CAPS Take 100 mg by mouth 2 (two) times daily.  10 capsule  0  . insulin NPH-regular Human (NOVOLIN 70/30 RELION) (70-30) 100 UNIT/ML injection 38 units with breakfast, and 18 units with the evening meal      . LORazepam (ATIVAN) 0.5 MG tablet Take 1 tablet (0.5 mg total) by mouth 2 (two) times daily as needed for anxiety.  60 tablet  2  . metoprolol tartrate (LOPRESSOR) 25 MG tablet TAKE ONE-HALF TABLET BY MOUTH TWICE DAILY  90 tablet  1  . Multiple Vitamin (MULTIVITAMIN WITH MINERALS) TABS Take 1 tablet by mouth daily.      . phenytoin (DILANTIN) 100 MG ER capsule TAKE ONE CAPSULE BY MOUTH 4 TIMES DAILY  120 capsule  3  . phytonadione (MEPHYTON) 5 MG tablet Take 1 tablet  (5 mg total) by mouth once. Take 1/2 tablet by mouth now  1 tablet  0  . pravastatin (PRAVACHOL) 40 MG tablet Take 1 1/2 tablets daily at bedtime  45 tablet  2  . warfarin (COUMADIN) 2.5 MG tablet Take as directed by anticoagulation clinic  40 tablet  3   No current facility-administered medications on file prior to visit.    Allergies  Allergen Reactions  . Procaine Hcl Anaphylaxis    Family History  Problem Relation Age of Onset  . Diabetes Father   . Heart attack Father 63  . Diabetes Sister   . Cancer Neg Hx     no hx of colon or breast cancer    BP 122/62  Pulse 62  Temp(Src) 97.9 F (36.6 C) (Oral)  Ht 5' (1.524 m)  Wt 221 lb (100.245 kg)  BMI 43.16 kg/m2  SpO2 92%   Review of Systems She has gained a few lbs.  Denies n/v.      Objective:   Physical Exam Pulses: dorsalis pedis intact bilat.   Feet: no deformity. normal color and temp.  no edema.   Skin:  no ulcer on the feet.   Neuro: sensation is intact to touch on the feet, but decreased from normal.     Lab Results  Component Value Date   HGBA1C 7.2* 10/10/2013      Assessment & Plan:  DM: this is the best control this pt should aim for, given this regimen, which does match insulin to her changing needs throughout the day Side-effect of rx, severe: hypoglycemia: this limits the a1c we can achieve. weight gain: this complicates the rx of dm.  Pt is advised to re-lose.   Patient is advised the following: Patient Instructions  blood tests are being requested for you today.  We'll contact you with results.   Please come back for a follow-up appointment in 3 months.   check your blood sugar twice a day.  vary the time of day when you check, between before the 3 meals, and at bedtime.  also check if you have symptoms of your blood sugar being too high or too low.  please keep a record of the readings and bring it to your next appointment here.  please call us sooner if your blood sugar goes below 70, or if  you have a lot of readings over 200.   i have sent a prescription to your pharmacy, for a medication called "  glucagon."  This is a single-use emergency kit.  It will help you when your blood sugar is so low, you need someone else to help you.  The side-effect is nausea, so you should be turned on your side after getting this shot.

## 2013-10-10 NOTE — Patient Instructions (Addendum)
blood tests are being requested for you today.  We'll contact you with results.   Please come back for a follow-up appointment in 3 months.   check your blood sugar twice a day.  vary the time of day when you check, between before the 3 meals, and at bedtime.  also check if you have symptoms of your blood sugar being too high or too low.  please keep a record of the readings and bring it to your next appointment here.  please call us sooner if your blood sugar goes below 70, or if you have a lot of readings over 200.   i have sent a prescription to your pharmacy, for a medication called "glucagon."  This is a single-use emergency kit.  It will help you when your blood sugar is so low, you need someone else to help you.  The side-effect is nausea, so you should be turned on your side after getting this shot.

## 2013-10-11 ENCOUNTER — Telehealth: Payer: Self-pay | Admitting: Endocrinology

## 2013-10-11 NOTE — Telephone Encounter (Signed)
There is no injectable alternative. You can use a cake-writing tube (not frosting).  However, you must be awake to take this.

## 2013-10-11 NOTE — Telephone Encounter (Signed)
Patient stated that meds Glucagon 1 mg is to expensive can't afford it could Dr [name] call in something else cheaper.

## 2013-10-11 NOTE — Telephone Encounter (Signed)
See below and please advise, Thanks!  

## 2013-10-12 NOTE — Telephone Encounter (Signed)
Called pt and she states she will not pay for the medication and she did not want to try the cake-writing tube.

## 2013-10-17 ENCOUNTER — Other Ambulatory Visit: Payer: Self-pay | Admitting: Internal Medicine

## 2013-11-08 ENCOUNTER — Other Ambulatory Visit: Payer: Self-pay

## 2013-11-08 ENCOUNTER — Ambulatory Visit (INDEPENDENT_AMBULATORY_CARE_PROVIDER_SITE_OTHER): Payer: Medicare Other | Admitting: Pharmacist

## 2013-11-08 ENCOUNTER — Other Ambulatory Visit: Payer: Medicare Other

## 2013-11-08 DIAGNOSIS — E118 Type 2 diabetes mellitus with unspecified complications: Secondary | ICD-10-CM

## 2013-11-08 DIAGNOSIS — Z5181 Encounter for therapeutic drug level monitoring: Secondary | ICD-10-CM

## 2013-11-08 DIAGNOSIS — Z7901 Long term (current) use of anticoagulants: Secondary | ICD-10-CM

## 2013-11-08 DIAGNOSIS — I4892 Unspecified atrial flutter: Secondary | ICD-10-CM

## 2013-11-08 DIAGNOSIS — I4891 Unspecified atrial fibrillation: Secondary | ICD-10-CM

## 2013-11-08 LAB — MICROALBUMIN / CREATININE URINE RATIO
Creatinine,U: 60 mg/dL
MICROALB UR: 5.8 mg/dL — AB (ref 0.0–1.9)
MICROALB/CREAT RATIO: 9.7 mg/g (ref 0.0–30.0)

## 2013-11-08 LAB — POCT INR: INR: 2.3

## 2013-11-10 ENCOUNTER — Telehealth: Payer: Self-pay | Admitting: Pharmacist

## 2013-11-10 NOTE — Telephone Encounter (Signed)
Message copied by Aris Georgia on Fri Nov 10, 2013  3:45 PM ------      Message from: [place], [place] P      Created: Tue Oct 03, 2013 11:47 PM       Yes but she needs to be seen in our office to arrange gradual dilantin taper and introduction of alternative AED      ----- Message -----         From: Aris Georgia, [facility]         Sent: 10/02/2013  12:40 PM           To: Antony Contras, MD            Mrs. Bigness is a mutual pt we follow in the Coumadin clinic.  For the past year or so we have had a lot of issues managing her INR for unknown reasons (pt will have INR swings from 1.4 to >8 and not report any changes).  I would like to get her switched to a NOAC to help protect her from stroke and bleeds more effectively but she is on dilantin- which cannot be used with the NOACs.  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  Is there any way we can consider switching her to another agent with no drug interactions with the NOACs?  Thanks!        ------

## 2013-11-10 NOTE — Telephone Encounter (Signed)
Helped pt set up appt to see Dr. Leonie Man on 9/15 at 10:00 to start dilantin taper.

## 2013-11-10 NOTE — Telephone Encounter (Signed)
New message      Pt request to talk to [place].  She was seen recently and request Gay Filler

## 2013-11-13 ENCOUNTER — Other Ambulatory Visit: Payer: Self-pay | Admitting: Internal Medicine

## 2013-11-14 ENCOUNTER — Ambulatory Visit (INDEPENDENT_AMBULATORY_CARE_PROVIDER_SITE_OTHER): Payer: Medicare Other | Admitting: Neurology

## 2013-11-14 ENCOUNTER — Encounter: Payer: Self-pay | Admitting: Neurology

## 2013-11-14 ENCOUNTER — Telehealth: Payer: Self-pay

## 2013-11-14 VITALS — BP 162/71 | HR 58 | Wt 217.4 lb

## 2013-11-14 DIAGNOSIS — G40209 Localization-related (focal) (partial) symptomatic epilepsy and epileptic syndromes with complex partial seizures, not intractable, without status epilepticus: Secondary | ICD-10-CM

## 2013-11-14 MED ORDER — PHENYTOIN SODIUM EXTENDED 100 MG PO CAPS: 300.0000 mg | ORAL_CAPSULE | Freq: Every day | ORAL | Status: DC

## 2013-11-14 MED ORDER — LORAZEPAM 0.5 MG PO TABS: 0.5000 mg | ORAL_TABLET | Freq: Two times a day (BID) | ORAL | Status: DC | PRN

## 2013-11-14 NOTE — Telephone Encounter (Signed)
Medication faxed to [facility].

## 2013-11-14 NOTE — Progress Notes (Signed)
PATIENT: [place] DOB: 1940-09-20  REASON FOR VISIT: follow up for seizure HISTORY FROM: patient  HISTORY OF PRESENT ILLNESS: Update 07/14/13 (LL):  Since last visit, has struggled with hip pain since surgery, denies falls, ambulates with a cane.  No recurrent seizures, states taking Dilantin 100 ER QID but level last month was only 3.5, she takes 2 in the morning, 1 in the afternoon and 1 at bedtime.  Right hand tremor is stable. MMSE is stable at 26/30.  Update 09/27/12 (LL): Patient comes in for 6 month follow up for seizures. Doing well, had fractured right hip recently from fall in yard, status post surgery 07/15/12. Released from rehab to home 3 weeks ago. Ambulating with cane, doing well. No further seizures since initial episode. She is on Dilantin 150 mg 3 times a day and seems to the tolerating it well without any dizziness, imbalance, diplopia or dysarthria. Thinks that memory is stable, only notable trouble is remembering numbers. Still has mild tremor of the right hand, reports it is better with Ativan. No new complaints.   Update January 21st 2014 (PS): She returns for followup after last visit on 12/11/2011. She continues to do well without any recurrent seizures following the one during her initial hospitalization in March 2013. She is on Dilantin 150 mg 3 times a day and seems to the tolerating it well without any dizziness, imbalance, diplopia or dysarthria. She continues to have mild short-term memory difficulties which are mainly related to her new memory. She can remember remote incidents quite well. She also complains of mild action tremor of the right hand but it has not been disabling.  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 She also has mild diabetic peripheral neuropathy. Overall doing well. One  syncopal episode related to hypoglycemia.  Update 11/14/13 : She returns for followup of her last visit 4 months ago. She continues to do well without recurrent seizures since the initial one in March 2013. She is currently on Vantin 400 mg daily and seems to be covering it very well. She does have mild tremor of the left hand but denies dizziness, diplopia or vertigo sleepiness. She continues to have mild short-term memory difficulties but she seems to be coping quite well with them and these are not progressive. In fact she is still balancing checkbooks and writing checks and not making any mistakes. She has no new complaints today at REVIEW OF SYSTEMS: Full 14 system review of systems performed and notable only for:  Cataract surgery, vision difficulties, tremors and memory loss only and all other systems negative   ALLERGIES: Allergies  Allergen Reactions  . Procaine Hcl Anaphylaxis    HOME MEDICATIONS: Outpatient Prescriptions Prior to Visit  Medication Sig Dispense Refill  . calcium-vitamin D (OSCAL WITH D) 500-200 MG-UNIT per tablet Take 1 tablet by mouth daily.      [name] docusate sodium 100 MG CAPS Take 100 mg by mouth 2 (two) times daily.  10 capsule  0  . insulin NPH-regular Human (NOVOLIN 70/30 RELION) (70-30) 100 UNIT/ML injection 38 units with breakfast, and 18 units with the evening meal      . metoprolol tartrate (LOPRESSOR) 25 MG tablet TAKE ONE-HALF TABLET BY MOUTH TWICE DAILY  90 tablet  1  . phytonadione (MEPHYTON) 5 MG tablet Take 1 tablet (5 mg total) by mouth  once. Take 1/2 tablet by mouth now  1 tablet  0  . pravastatin (PRAVACHOL) 40 MG tablet Take 1 1/2 tablets daily at bedtime  45 tablet  2  . warfarin (COUMADIN) 2.5 MG tablet Take as directed by anticoagulation clinic  40 tablet  3  . LORazepam (ATIVAN) 0.5 MG tablet Take 1 tablet (0.5 mg total) by mouth 2 (two) times daily as needed for anxiety.  60 tablet  2  . phenytoin (DILANTIN) 100 MG ER capsule TAKE ONE CAPSULE BY  MOUTH 4 TIMES DAILY  120 capsule  3  . albuterol (VENTOLIN HFA) 108 (90 BASE) MCG/ACT inhaler Inhale 2 puffs into the lungs every 6 (six) hours as needed for wheezing.  1 Inhaler  1  . aspirin EC 81 MG tablet Take 81 mg by mouth once a week.       . colchicine 0.6 MG tablet Take 1 tablet (0.6 mg total) by mouth 2 (two) times daily.  60 tablet  0  . diltiazem (CARDIZEM CD) 240 MG 24 hr capsule TAKE ONE CAPSULE BY MOUTH ONCE DAILY  30 capsule  6  . glucagon 1 MG injection Inject 1 mg into the muscle once as needed. For low blood sugar  1 each  12  . Multiple Vitamin (MULTIVITAMIN WITH MINERALS) TABS Take 1 tablet by mouth daily.       No facility-administered medications prior to visit.     PHYSICAL EXAM  Filed Vitals:   11/14/13 1017  BP: 162/71  Pulse: 58  Weight: 217 lb 6.4 oz (98.612 kg)   Body mass index is 42.46 kg/(m^2). No exam data present   Generalized: In no acute distress, very pleasant Caucasian female  Neck: Supple, no carotid bruits  Cardiac: Regular rate rhythm, no murmur  Pulmonary: Clear to auscultation bilaterally  Musculoskeletal: No deformity   Neurological examination  Mentation: Alert oriented to time, place, history taking, language fluent, and casual conversation. MMSE not done today. Diminished recall 1/3.  Cranial nerve II-XII: Pupils were equal round reactive to light extraocular movements were full, LEGALLY BLIND L EYE. R EYE PTOSIS. Facial sensation and strength were normal. hearing was intact to finger rubbing bilaterally. Uvula tongue midline. head turning and shoulder shrug and were normal and symmetric.Tongue protrusion into cheek strength was normal.  MOTOR: normal bulk and tone, full strength in the BUE, decreased strength 4/5 hip flexion RLE (due to hip surgery) , LLE 5/5 strength. fine finger movements normal, no pronator drift. Fine action and resting tremor of right hand. No cogwheel rigidity or bradykinesia.  SENSORY: normal and symmetric to  light touch Vibration and position impaired over toes.  COORDINATION: finger-nose-finger, heel-to-shin bilaterally, there was no truncal ataxia  REFLEXES: Brachioradialis 2/2, biceps 2/2, triceps 2/2, patellar 1/1, Achilles trace/trace  GAIT/STATION: Rising up from seated position without assistance, wide stance, without trunk ataxia, slow but steady athralgic gait with favoring of the right hip. Unable to perform tiptoe, heel walking or tandem.   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 She also has mild diabetic peripheral neuropathy. Overall doing well.   Reduce Dilantin 100 mg ER capsule to 3 times daily for seizure prophylaxis.  Continue to perform I'm challenging activities such as soduko, crossword puzzles. She is not driving and has not in the last 15 years secondary to legally blind in left eye.  Followup in 6 months with Charlott Holler, NP.   Meds ordered this encounter  Medications  . phenytoin (DILANTIN) 100 MG ER capsule    Sig: Take 3 capsules (300 mg total) by mouth daily.    Dispense:  120 capsule    Refill:  3   Return in about 6 months (around 05/15/2014).  Antony Contras, MD  11/14/2013, 10:48 AM [facility] [address], [place], [place] [address] [phone]  Note: This document was prepared with digital dictation and possible smart phrase technology. Any transcriptional errors that result from this process are unintentional.

## 2013-11-14 NOTE — Patient Instructions (Signed)
I had a long discussion with the patient and her husband regarding her seizures and Dilantin. She has been seizure-free from the 2-1/2 years hence recommend she reduce her Dilantin to 300 mg daily. Continue participation and cognitively challenging task for her mild cognitive impairment. No medications for memory are necessary at this time Return for followup in 6 months with Charlott Holler, NP.or. call earlier if necessary

## 2013-11-14 NOTE — Telephone Encounter (Signed)
RF printed, will need UDS on RTC

## 2013-11-14 NOTE — Telephone Encounter (Signed)
Pt is requesting refill for Lorazepam.  Last OV: 10/05/2013 Last Fill: 05/29/2013 # 60 and 2 RF No UDS  Please advise.

## 2013-11-24 ENCOUNTER — Encounter: Payer: Self-pay | Admitting: Internal Medicine

## 2013-11-24 ENCOUNTER — Ambulatory Visit (INDEPENDENT_AMBULATORY_CARE_PROVIDER_SITE_OTHER): Payer: Medicare Other | Admitting: Internal Medicine

## 2013-11-24 VITALS — BP 130/68 | HR 63 | Temp 97.8°F | Wt 218.4 lb

## 2013-11-24 DIAGNOSIS — I10 Essential (primary) hypertension: Secondary | ICD-10-CM

## 2013-11-24 DIAGNOSIS — F419 Anxiety disorder, unspecified: Secondary | ICD-10-CM

## 2013-11-24 DIAGNOSIS — F32A Depression, unspecified: Secondary | ICD-10-CM | POA: Insufficient documentation

## 2013-11-24 DIAGNOSIS — F329 Major depressive disorder, single episode, unspecified: Secondary | ICD-10-CM | POA: Insufficient documentation

## 2013-11-24 DIAGNOSIS — Z23 Encounter for immunization: Secondary | ICD-10-CM

## 2013-11-24 DIAGNOSIS — F411 Generalized anxiety disorder: Secondary | ICD-10-CM

## 2013-11-24 DIAGNOSIS — G40909 Epilepsy, unspecified, not intractable, without status epilepticus: Secondary | ICD-10-CM

## 2013-11-24 DIAGNOSIS — E118 Type 2 diabetes mellitus with unspecified complications: Secondary | ICD-10-CM

## 2013-11-24 DIAGNOSIS — E785 Hyperlipidemia, unspecified: Secondary | ICD-10-CM

## 2013-11-24 MED ORDER — ESCITALOPRAM OXALATE 10 MG PO TABS: 10.0000 mg | ORAL_TABLET | Freq: Every day | ORAL | Status: DC

## 2013-11-24 NOTE — Assessment & Plan Note (Signed)
Her son has a number of medico-legal problems, he has been living with her for the last couple of years, he is current in-hospital, the patient is quite "aggravated" by the issue,  which I believe is results of the stress. She takes Ativan but needs further help. No depression or suicidal ideas Plan: Continue Ativan (UDS today) add Lexapro Next office in 6 weeks, side effects discussed

## 2013-11-24 NOTE — Assessment & Plan Note (Addendum)
Based on last cholesterol panel, Pravachol was increased from 40 mg to 60 mg --- check labs

## 2013-11-24 NOTE — Assessment & Plan Note (Signed)
Well-controlled,  check a BMP 

## 2013-11-24 NOTE — Progress Notes (Signed)
Pre visit review using our clinic review tool, if applicable. No additional management support is needed unless otherwise documented below in the visit note. 

## 2013-11-24 NOTE — Progress Notes (Signed)
Subjective:    Patient ID: [name] [name], female    DOB: 1940-08-15, 73 y.o.   MRN: [id number]  DOS:  11/24/2013 Type of visit - description : rov Interval history: Diabetes, was seen a few weeks ago with syncope thought to be due to hypoglycemia, no further symptoms, no recent low blood sugars High cholesterol on a higher dose of Pravachol, good compliance, no apparent side effects Seizure disorder, saw neurology, Dilantin dose decreased, no seizure activity. Anxiety, the patient's son has a number of medical legal issues, the patient is quite distressed night, very moody and "aggravated" needs help to cope with the problem.  ROS Denies chest pain or difficulty breathing No  nausea, vomiting, diarrhea or blood in the stools. No depression or suicidal ideas  Past Medical History  Diagnosis Date  . Blindness of left eye   . Eye muscle weakness     Right eye weakness after cataract surgery  . Common migraine     ?hx of  . DIABETES MELLITUS, TYPE II 03/27/2006    dr [name]  . HYPERLIPIDEMIA 03/27/2006  . 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 HYPERTENSION 03/27/2006  . Atrial fibrillation     SVT dx 2007, cath 2007 mild CAD, then had an cardioversion, ablation; still on coumadin , has occ palpitation, EKG 03-2010 NSR  . Headache(784.0) 05/07/2009  . LUNG NODULE 09/01/2006    Excision, Bx Benighn  . Osteopenia 2004    Dexa 2004 showed Osteopenia, DEXA 03/2007 normal  . Seizures   . Intertrochanteric fracture of right hip 07/13/2012  . 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 GERD (gastroesophageal reflux disease) 10/05/2011    Past Surgical History  Procedure Laterality Date  . Shoulder surgery    . Femur im nail Right 07/15/2012    Procedure: INTRAMEDULLARY (IM) NAIL HIP;  Surgeon: Johnny Bridge, MD;  Location: [facility];  Service: Orthopedics;  Laterality: Right;    History   Social History  . Marital Status: Married    Spouse Name: Jenny Reichmann    Number of Children: 1  . Years of  Education: College   Occupational History  . retired     Social History Main Topics  . Smoking status: Former Smoker    Types: Cigarettes    Quit date: 03/02/1978  . Smokeless tobacco: Never Used  . Alcohol Use: No  . Drug Use: No  . Sexual Activity: No   Other Topics Concern  . Not on file   Social History Narrative   Patient lives at home spouse.   Moved from [place] 2004            Medication List       This list is accurate as of: 11/24/13  2:56 PM.  Always use your most recent med list.               calcium-vitamin D 500-200 MG-UNIT per tablet  Commonly known as:  OSCAL WITH D  Take 1 tablet by mouth daily.     DSS 100 MG Caps  Take 100 mg by mouth 2 (two) times daily.     escitalopram 10 MG tablet  Commonly known as:  LEXAPRO  Take 1 tablet (10 mg total) by mouth daily.     LORazepam 0.5 MG tablet  Commonly known as:  ATIVAN  Take 1 tablet (0.5 mg total) by mouth 2 (two) times daily as needed for anxiety.     metoprolol tartrate 25 MG tablet  Commonly known as:  LOPRESSOR  TAKE ONE-HALF TABLET BY MOUTH TWICE DAILY     NOVOLIN 70/30 RELION (70-30) 100 UNIT/ML injection  Generic drug:  insulin NPH-regular Human  38 units with breakfast, and 18 units with the evening meal     phenytoin 100 MG ER capsule  Commonly known as:  DILANTIN  Take 3 capsules (300 mg total) by mouth daily.     phytonadione 5 MG tablet  Commonly known as:  VITAMIN K  Take 5 mg by mouth once. Take 1/2 tablet by mouth now (PRN)     pravastatin 40 MG tablet  Commonly known as:  PRAVACHOL  Take 1 1/2 tablets daily at bedtime     warfarin 2.5 MG tablet  Commonly known as:  COUMADIN  Take as directed by anticoagulation clinic           Objective:   Physical Exam BP 130/68  Pulse 63  Temp(Src) 97.8 F (36.6 C) (Oral)  Wt 218 lb 6 oz (99.054 kg)  SpO2 97% General -- alert, well-developed, NAD.  Lungs -- normal respiratory effort, no intercostal retractions, no  accessory muscle use, and normal breath sounds.  Heart-- seems regular today, no murmur.  Extremities-- no pretibial edema bilaterally  Neurologic--  alert & oriented X3.   Psych-- Cognition and judgment appear intact. Cooperative with normal attention span and concentration. No anxious or depressed appearing.      Assessment & Plan:    Flu shot provided, declined to get the high dose one

## 2013-11-24 NOTE — Patient Instructions (Signed)
Stop by the front desk and schedule labs to be done within few days (fasting) BMP --- dx hypertension AST, ALT, FLP --- dx high cholesterol  Start taking Lexapro 10 mg one tablet at bedtime. Watch for side effects.   Please come back to the office 6 weeks  for a routine check up regards lexapro, no fasting    Stop by the front desk and schedule the visit

## 2013-11-24 NOTE — Assessment & Plan Note (Signed)
Dilantin dose recently decreased to 3 tablets daily, no seizure activity

## 2013-11-27 ENCOUNTER — Other Ambulatory Visit (INDEPENDENT_AMBULATORY_CARE_PROVIDER_SITE_OTHER): Payer: Medicare Other

## 2013-11-27 DIAGNOSIS — E785 Hyperlipidemia, unspecified: Secondary | ICD-10-CM

## 2013-11-27 DIAGNOSIS — I10 Essential (primary) hypertension: Secondary | ICD-10-CM

## 2013-11-27 LAB — LIPID PANEL
CHOL/HDL RATIO: 3
Cholesterol: 203 mg/dL — ABNORMAL HIGH (ref 0–200)
HDL: 59 mg/dL (ref >39.00)
LDL Cholesterol: 124 mg/dL — ABNORMAL HIGH (ref 0–99)
NonHDL: 144
Triglycerides: 101 mg/dL (ref 0.0–149.0)
VLDL: 20.2 mg/dL (ref 0.0–40.0)

## 2013-11-27 LAB — BASIC METABOLIC PANEL
BUN: 15 mg/dL (ref 6–23)
CALCIUM: 9 mg/dL (ref 8.4–10.5)
CHLORIDE: 98 meq/L (ref 96–112)
CO2: 30 mEq/L (ref 19–32)
Creatinine, Ser: 1.1 mg/dL (ref 0.4–1.2)
GFR: 53.58 mL/min — ABNORMAL LOW (ref >60.00)
Glucose, Bld: 178 mg/dL — ABNORMAL HIGH (ref 70–99)
Potassium: 4.7 mEq/L (ref 3.5–5.1)
Sodium: 135 mEq/L (ref 135–145)

## 2013-11-27 LAB — AST: AST: 22 U/L (ref 0–37)

## 2013-11-27 LAB — ALT: ALT: 13 U/L (ref 0–35)

## 2013-11-29 ENCOUNTER — Ambulatory Visit (INDEPENDENT_AMBULATORY_CARE_PROVIDER_SITE_OTHER): Payer: Medicare Other | Admitting: Pharmacist

## 2013-11-29 DIAGNOSIS — Z7901 Long term (current) use of anticoagulants: Secondary | ICD-10-CM

## 2013-11-29 DIAGNOSIS — I4892 Unspecified atrial flutter: Secondary | ICD-10-CM

## 2013-11-29 DIAGNOSIS — I4891 Unspecified atrial fibrillation: Secondary | ICD-10-CM

## 2013-11-29 DIAGNOSIS — Z5181 Encounter for therapeutic drug level monitoring: Secondary | ICD-10-CM

## 2013-11-29 LAB — POCT INR: INR: 1.7

## 2013-11-30 ENCOUNTER — Telehealth: Payer: Self-pay | Admitting: Internal Medicine

## 2013-11-30 MED ORDER — ATORVASTATIN CALCIUM 40 MG PO TABS: ORAL_TABLET | ORAL | Status: DC

## 2013-11-30 NOTE — Telephone Encounter (Signed)
Caller name: [place]  Relation to pt: self  Call back number: [phone]   Reason for call: patient returning your call

## 2013-12-01 ENCOUNTER — Telehealth: Payer: Self-pay

## 2013-12-01 NOTE — Telephone Encounter (Signed)
See lab result notes

## 2013-12-01 NOTE — Telephone Encounter (Signed)
UDS: 11/24/2013  Positive for Ativan.  Low risk per Dr. Larose Kells 12/01/2013

## 2013-12-04 ENCOUNTER — Other Ambulatory Visit: Payer: Self-pay | Admitting: Endocrinology

## 2013-12-06 ENCOUNTER — Encounter: Payer: Self-pay | Admitting: Internal Medicine

## 2013-12-20 ENCOUNTER — Ambulatory Visit (INDEPENDENT_AMBULATORY_CARE_PROVIDER_SITE_OTHER): Payer: Medicare Other | Admitting: *Deleted

## 2013-12-20 DIAGNOSIS — I4892 Unspecified atrial flutter: Secondary | ICD-10-CM

## 2013-12-20 DIAGNOSIS — I4891 Unspecified atrial fibrillation: Secondary | ICD-10-CM

## 2013-12-20 DIAGNOSIS — Z7901 Long term (current) use of anticoagulants: Secondary | ICD-10-CM

## 2013-12-20 DIAGNOSIS — Z5181 Encounter for therapeutic drug level monitoring: Secondary | ICD-10-CM

## 2013-12-20 LAB — POCT INR: INR: 2

## 2014-01-05 ENCOUNTER — Ambulatory Visit (INDEPENDENT_AMBULATORY_CARE_PROVIDER_SITE_OTHER): Payer: Medicare Other | Admitting: Internal Medicine

## 2014-01-05 ENCOUNTER — Encounter: Payer: Self-pay | Admitting: Internal Medicine

## 2014-01-05 VITALS — BP 146/63 | HR 70 | Temp 98.2°F | Wt 214.4 lb

## 2014-01-05 DIAGNOSIS — F419 Anxiety disorder, unspecified: Secondary | ICD-10-CM

## 2014-01-05 DIAGNOSIS — E785 Hyperlipidemia, unspecified: Secondary | ICD-10-CM

## 2014-01-05 MED ORDER — ESCITALOPRAM OXALATE 10 MG PO TABS: 10.0000 mg | ORAL_TABLET | Freq: Every day | ORAL | Status: DC

## 2014-01-05 NOTE — Progress Notes (Signed)
Pre visit review using our clinic review tool, if applicable. No additional management support is needed unless otherwise documented below in the visit note. 

## 2014-01-05 NOTE — Assessment & Plan Note (Signed)
Based on last cholesterol panel she was switch to Lipitor, good compliance, check labs.

## 2014-01-05 NOTE — Assessment & Plan Note (Signed)
See previous entry, she started Lexapro. Her son continue to be a problem according to the patient, states "she is done w/ him" but her husband continue helping the son and that irritates the patient. She is extremely angry, denies any suicidal ideas, I did counsel her to the best of my ability. I don't believe   Increase Lexapro will help her much, she may benefit from counseling, info provided, RTC 3 months

## 2014-01-05 NOTE — Patient Instructions (Signed)
Stop by the front desk and schedule labs to be done within few days (fasting)  Please come back to the office IN 3 MONTHS  for a routine check up

## 2014-01-05 NOTE — Progress Notes (Signed)
Subjective:    Patient ID: [name] [name], female    DOB: Aug 05, 1940, 73 y.o.   MRN: [id number]  DOS:  01/05/2014 Type of visit - description : f/u Interval history: High cholesterol, switch to Lipitor, tolerates well Anxiety depression, started Lexapro, not better, see assessment and plan   ROS Sleeps well most nights, occ  needs a Tylenol PM Denies any suicidal ideas.   Past Medical History  Diagnosis Date  . Blindness of left eye   . Eye muscle weakness     Right eye weakness after cataract surgery  . Common migraine     ?hx of  . DIABETES MELLITUS, TYPE II 03/27/2006    dr [name]  . HYPERLIPIDEMIA 03/27/2006  . 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 HYPERTENSION 03/27/2006  . Atrial fibrillation     SVT dx 2007, cath 2007 mild CAD, then had an cardioversion, ablation; still on coumadin , has occ palpitation, EKG 03-2010 NSR  . Headache(784.0) 05/07/2009  . LUNG NODULE 09/01/2006    Excision, Bx Benighn  . Osteopenia 2004    Dexa 2004 showed Osteopenia, DEXA 03/2007 normal  . Seizures   . Intertrochanteric fracture of right hip 07/13/2012  . 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 GERD (gastroesophageal reflux disease) 10/05/2011    Past Surgical History  Procedure Laterality Date  . Shoulder surgery    . Femur im nail Right 07/15/2012    Procedure: INTRAMEDULLARY (IM) NAIL HIP;  Surgeon: Johnny Bridge, MD;  Location: [facility];  Service: Orthopedics;  Laterality: Right;    History   Social History  . Marital Status: Married    Spouse Name: [name] [name]    Number of Children: 1  . Years of Education: College   Occupational History  . retired     Social History Main Topics  . Smoking status: Former Smoker    Types: Cigarettes    Quit date: 03/02/1978  . Smokeless tobacco: Never Used  . Alcohol Use: No  . Drug Use: No  . Sexual Activity: No   Other Topics Concern  . Not on file   Social History Narrative   Patient lives at home spouse.   Moved from [place] 2004             Medication List       This list is accurate as of: 01/05/14  6:38 PM.  Always use your most recent med list.               atorvastatin 40 MG tablet  Commonly known as:  LIPITOR  Take 1 tablet daily at bedtime.     calcium-vitamin D 500-200 MG-UNIT per tablet  Commonly known as:  OSCAL WITH D  Take 1 tablet by mouth daily.     DSS 100 MG Caps  Take 100 mg by mouth 2 (two) times daily.     escitalopram 10 MG tablet  Commonly known as:  LEXAPRO  Take 1 tablet (10 mg total) by mouth daily.     LORazepam 0.5 MG tablet  Commonly known as:  ATIVAN  Take 1 tablet (0.5 mg total) by mouth 2 (two) times daily as needed for anxiety.     metoprolol tartrate 25 MG tablet  Commonly known as:  LOPRESSOR  TAKE ONE-HALF TABLET BY MOUTH TWICE DAILY     NOVOLIN 70/30 RELION (70-30) 100 UNIT/ML injection  Generic drug:  insulin NPH-regular Human  38 units with breakfast, and 18 units with the evening meal  NOVOLIN 70/30 RELION (70-30) 100 UNIT/ML injection  Generic drug:  insulin NPH-regular Human  INJECT 50 UNITS SUBCUTANEOUSLY ONCE DAILY WITH BREAKFAST     phenytoin 100 MG ER capsule  Commonly known as:  DILANTIN  Take 3 capsules (300 mg total) by mouth daily.     phytonadione 5 MG tablet  Commonly known as:  VITAMIN K  Take 5 mg by mouth once. Take 1/2 tablet by mouth now (PRN)     warfarin 2.5 MG tablet  Commonly known as:  COUMADIN  Take as directed by anticoagulation clinic           Objective:   Physical Exam BP 146/63 mmHg  Pulse 70  Temp(Src) 98.2 F (36.8 C) (Oral)  Wt 214 lb 6 oz (97.24 kg)  SpO2 93% General -- alert, well-developed   Psych-- Cognition and judgment appear intact. Cooperative with normal attention span and concentration.  She seems to be extremely angry about the situation with her son     Assessment & Plan:

## 2014-01-09 ENCOUNTER — Other Ambulatory Visit (INDEPENDENT_AMBULATORY_CARE_PROVIDER_SITE_OTHER): Payer: Medicare Other

## 2014-01-09 ENCOUNTER — Telehealth: Payer: Self-pay | Admitting: Internal Medicine

## 2014-01-09 DIAGNOSIS — E785 Hyperlipidemia, unspecified: Secondary | ICD-10-CM

## 2014-01-09 LAB — LIPID PANEL
Cholesterol: 167 mg/dL (ref 0–200)
HDL: 48.7 mg/dL (ref >39.00)
LDL CALC: 98 mg/dL (ref 0–99)
NONHDL: 118.3
Total CHOL/HDL Ratio: 3
Triglycerides: 100 mg/dL (ref 0.0–149.0)
VLDL: 20 mg/dL (ref 0.0–40.0)

## 2014-01-09 LAB — AST: AST: 17 U/L (ref 0–37)

## 2014-01-09 LAB — ALT: ALT: 11 U/L (ref 0–35)

## 2014-01-09 NOTE — Telephone Encounter (Signed)
error 

## 2014-01-16 ENCOUNTER — Ambulatory Visit (INDEPENDENT_AMBULATORY_CARE_PROVIDER_SITE_OTHER): Payer: Medicare Other | Admitting: Pharmacist

## 2014-01-16 DIAGNOSIS — I4892 Unspecified atrial flutter: Secondary | ICD-10-CM

## 2014-01-16 DIAGNOSIS — Z5181 Encounter for therapeutic drug level monitoring: Secondary | ICD-10-CM

## 2014-01-16 DIAGNOSIS — Z7901 Long term (current) use of anticoagulants: Secondary | ICD-10-CM

## 2014-01-16 DIAGNOSIS — I4891 Unspecified atrial fibrillation: Secondary | ICD-10-CM

## 2014-01-16 LAB — POCT INR: INR: 1.8

## 2014-01-17 ENCOUNTER — Ambulatory Visit: Payer: Medicare Other | Admitting: Neurology

## 2014-01-22 ENCOUNTER — Inpatient Hospital Stay (HOSPITAL_BASED_OUTPATIENT_CLINIC_OR_DEPARTMENT_OTHER)
Admission: EM | Admit: 2014-01-22 | Discharge: 2014-01-24 | DRG: 603 | Disposition: A | Discharge status: Home / Self Care | Payer: Medicare Other | Attending: Internal Medicine | Admitting: Internal Medicine

## 2014-01-22 ENCOUNTER — Encounter (HOSPITAL_BASED_OUTPATIENT_CLINIC_OR_DEPARTMENT_OTHER): Payer: Self-pay | Admitting: *Deleted

## 2014-01-22 DIAGNOSIS — H3500 Unspecified background retinopathy: Secondary | ICD-10-CM | POA: Diagnosis present

## 2014-01-22 DIAGNOSIS — I482 Chronic atrial fibrillation: Secondary | ICD-10-CM | POA: Diagnosis not present

## 2014-01-22 DIAGNOSIS — E119 Type 2 diabetes mellitus without complications: Secondary | ICD-10-CM | POA: Diagnosis present

## 2014-01-22 DIAGNOSIS — Z87891 Personal history of nicotine dependence: Secondary | ICD-10-CM

## 2014-01-22 DIAGNOSIS — G40909 Epilepsy, unspecified, not intractable, without status epilepticus: Secondary | ICD-10-CM | POA: Diagnosis present

## 2014-01-22 DIAGNOSIS — I4891 Unspecified atrial fibrillation: Secondary | ICD-10-CM | POA: Diagnosis present

## 2014-01-22 DIAGNOSIS — I10 Essential (primary) hypertension: Secondary | ICD-10-CM | POA: Diagnosis present

## 2014-01-22 DIAGNOSIS — L03113 Cellulitis of right upper limb: Principal | ICD-10-CM

## 2014-01-22 DIAGNOSIS — H5442 Blindness, left eye, normal vision right eye: Secondary | ICD-10-CM | POA: Diagnosis present

## 2014-01-22 DIAGNOSIS — E785 Hyperlipidemia, unspecified: Secondary | ICD-10-CM | POA: Diagnosis present

## 2014-01-22 DIAGNOSIS — Z7901 Long term (current) use of anticoagulants: Secondary | ICD-10-CM

## 2014-01-22 DIAGNOSIS — I2510 Atherosclerotic heart disease of native coronary artery without angina pectoris: Secondary | ICD-10-CM | POA: Diagnosis present

## 2014-01-22 DIAGNOSIS — M8580 Other specified disorders of bone density and structure, unspecified site: Secondary | ICD-10-CM | POA: Diagnosis present

## 2014-01-22 DIAGNOSIS — K219 Gastro-esophageal reflux disease without esophagitis: Secondary | ICD-10-CM | POA: Diagnosis present

## 2014-01-22 DIAGNOSIS — R609 Edema, unspecified: Secondary | ICD-10-CM | POA: Diagnosis not present

## 2014-01-22 DIAGNOSIS — Z794 Long term (current) use of insulin: Secondary | ICD-10-CM

## 2014-01-22 DIAGNOSIS — G43009 Migraine without aura, not intractable, without status migrainosus: Secondary | ICD-10-CM | POA: Diagnosis present

## 2014-01-22 LAB — CBC WITH DIFFERENTIAL/PLATELET
Basophils Absolute: 0 10*3/uL (ref 0.0–0.1)
Basophils Relative: 0 % (ref 0–1)
EOS ABS: 0.2 10*3/uL (ref 0.0–0.7)
Eosinophils Relative: 2 % (ref 0–5)
HEMATOCRIT: 36.7 % (ref 36.0–46.0)
HEMOGLOBIN: 11.9 g/dL — AB (ref 12.0–15.0)
Lymphocytes Relative: 13 % (ref 12–46)
Lymphs Abs: 1.7 10*3/uL (ref 0.7–4.0)
MCH: 30.6 pg (ref 26.0–34.0)
MCHC: 32.4 g/dL (ref 30.0–36.0)
MCV: 94.3 fL (ref 78.0–100.0)
MONO ABS: 1.7 10*3/uL — AB (ref 0.1–1.0)
MONOS PCT: 13 % — AB (ref 3–12)
NEUTROS PCT: 72 % (ref 43–77)
Neutro Abs: 9.4 10*3/uL — ABNORMAL HIGH (ref 1.7–7.7)
Platelets: 343 10*3/uL (ref 150–400)
RBC: 3.89 MIL/uL (ref 3.87–5.11)
RDW: 12.5 % (ref 11.5–15.5)
WBC: 13 10*3/uL — ABNORMAL HIGH (ref 4.0–10.5)

## 2014-01-22 LAB — BASIC METABOLIC PANEL
Anion gap: 14 (ref 5–15)
BUN: 13 mg/dL (ref 6–23)
CO2: 27 mEq/L (ref 19–32)
Calcium: 9.2 mg/dL (ref 8.4–10.5)
Chloride: 95 mEq/L — ABNORMAL LOW (ref 96–112)
Creatinine, Ser: 1 mg/dL (ref 0.50–1.10)
GFR calc Af Amer: 64 mL/min — ABNORMAL LOW (ref >90)
GFR, EST NON AFRICAN AMERICAN: 55 mL/min — AB (ref >90)
GLUCOSE: 288 mg/dL — AB (ref 70–99)
Potassium: 4.4 mEq/L (ref 3.7–5.3)
Sodium: 136 mEq/L — ABNORMAL LOW (ref 137–147)

## 2014-01-22 LAB — PROTIME-INR
INR: 1.81 — ABNORMAL HIGH (ref 0.00–1.49)
Prothrombin Time: 21 seconds — ABNORMAL HIGH (ref 11.6–15.2)

## 2014-01-22 LAB — I-STAT CG4 LACTIC ACID, ED: Lactic Acid, Venous: 1.2 mmol/L (ref 0.5–2.2)

## 2014-01-22 MED ORDER — CLINDAMYCIN PHOSPHATE 600 MG/50ML IV SOLN
600.0000 mg | Freq: Once | INTRAVENOUS | Status: AC
  Administered 2014-01-22: 600 mg via INTRAVENOUS
  Filled 2014-01-22: qty 50

## 2014-01-22 MED ORDER — TETANUS-DIPHTH-ACELL PERTUSSIS 5-2.5-18.5 LF-MCG/0.5 IM SUSP
0.5000 mL | Freq: Once | INTRAMUSCULAR | Status: AC
  Administered 2014-01-22: 0.5 mL via INTRAMUSCULAR
  Filled 2014-01-22: qty 0.5

## 2014-01-22 MED ORDER — INSULIN ASPART PROT & ASPART (70-30 MIX) 100 UNIT/ML ~~LOC~~ SUSP
18.0000 [IU] | Freq: Every day | SUBCUTANEOUS | Status: DC
  Administered 2014-01-23: 18 [IU] via SUBCUTANEOUS

## 2014-01-22 MED ORDER — SODIUM CHLORIDE 0.9 % IJ SOLN
3.0000 mL | Freq: Two times a day (BID) | INTRAMUSCULAR | Status: DC
  Administered 2014-01-23 – 2014-01-24 (×4): 3 mL via INTRAVENOUS

## 2014-01-22 MED ORDER — WARFARIN SODIUM 2.5 MG PO TABS: 2.5000 mg | ORAL_TABLET | Freq: Once | ORAL | Status: DC

## 2014-01-22 MED ORDER — CALCIUM CARBONATE-VITAMIN D 500-200 MG-UNIT PO TABS
1.0000 | ORAL_TABLET | Freq: Every day | ORAL | Status: DC
  Administered 2014-01-23 – 2014-01-24 (×2): 1 via ORAL
  Filled 2014-01-22 (×2): qty 1

## 2014-01-22 MED ORDER — ATORVASTATIN CALCIUM 40 MG PO TABS
40.0000 mg | ORAL_TABLET | Freq: Every day | ORAL | Status: DC
  Administered 2014-01-23 (×2): 40 mg via ORAL
  Filled 2014-01-22 (×3): qty 1

## 2014-01-22 MED ORDER — PHENYTOIN SODIUM EXTENDED 100 MG PO CAPS
300.0000 mg | ORAL_CAPSULE | Freq: Every day | ORAL | Status: DC
  Administered 2014-01-23 – 2014-01-24 (×2): 300 mg via ORAL
  Filled 2014-01-22 (×2): qty 3

## 2014-01-22 MED ORDER — DIPHENHYDRAMINE HCL 50 MG/ML IJ SOLN
25.0000 mg | Freq: Once | INTRAMUSCULAR | Status: AC
  Administered 2014-01-22: 25 mg via INTRAVENOUS
  Filled 2014-01-22: qty 1

## 2014-01-22 MED ORDER — METOPROLOL TARTRATE 12.5 MG HALF TABLET
12.5000 mg | ORAL_TABLET | Freq: Two times a day (BID) | ORAL | Status: DC
  Administered 2014-01-23 – 2014-01-24 (×4): 12.5 mg via ORAL
  Filled 2014-01-22 (×5): qty 1

## 2014-01-22 MED ORDER — DOCUSATE SODIUM 100 MG PO CAPS
100.0000 mg | ORAL_CAPSULE | Freq: Two times a day (BID) | ORAL | Status: DC
  Administered 2014-01-23 – 2014-01-24 (×4): 100 mg via ORAL
  Filled 2014-01-22 (×5): qty 1

## 2014-01-22 MED ORDER — PHYTONADIONE 5 MG PO TABS: 5.0000 mg | ORAL_TABLET | Freq: Once | ORAL | Status: DC

## 2014-01-22 MED ORDER — LORAZEPAM 0.5 MG PO TABS: 0.5000 mg | ORAL_TABLET | Freq: Two times a day (BID) | ORAL | Status: DC | PRN

## 2014-01-22 MED ORDER — INSULIN ASPART PROT & ASPART (70-30 MIX) 100 UNIT/ML ~~LOC~~ SUSP
38.0000 [IU] | Freq: Every day | SUBCUTANEOUS | Status: DC
  Administered 2014-01-23 – 2014-01-24 (×2): 38 [IU] via SUBCUTANEOUS
  Filled 2014-01-22: qty 10

## 2014-01-22 MED ORDER — ESCITALOPRAM OXALATE 10 MG PO TABS
10.0000 mg | ORAL_TABLET | Freq: Every day | ORAL | Status: DC
  Administered 2014-01-23 – 2014-01-24 (×2): 10 mg via ORAL
  Filled 2014-01-22 (×2): qty 1

## 2014-01-22 MED ORDER — CLINDAMYCIN PHOSPHATE 600 MG/50ML IV SOLN
600.0000 mg | Freq: Three times a day (TID) | INTRAVENOUS | Status: DC
  Administered 2014-01-23 – 2014-01-24 (×6): 600 mg via INTRAVENOUS
  Filled 2014-01-22 (×8): qty 50

## 2014-01-22 NOTE — ED Notes (Signed)
Attempted to call report to Nursing unit, EMS here to transport patient. The nurse was in a room per Nursing secretary. Awaiting call back for report.

## 2014-01-22 NOTE — ED Notes (Signed)
Called EMS to [facility] to [facility]

## 2014-01-22 NOTE — Plan of Care (Signed)
Problem: Phase I Progression Outcomes Goal: OOB as tolerated unless otherwise ordered Outcome: Completed/Met Date Met:  01/22/14 Goal: Voiding-avoid urinary catheter unless indicated Outcome: Not Applicable Date Met:  41/42/39

## 2014-01-22 NOTE — ED Provider Notes (Signed)
Complains of right wrist pain onset 2 days ago. Noticed redness and swelling of hand wrist and forearm, progressively worsening. On exam dorsum of wrist forearm and hand swollen red and tender with multiple scabbed lesions. Radial pulse 2+.  Orlie Dakin, MD 01/22/14 1715

## 2014-01-22 NOTE — ED Provider Notes (Signed)
CSN: [id number]     Arrival date & time 01/22/14  1530 History   First MD Initiated Contact with Patient 01/22/14 1606     Chief Complaint  Patient presents with  . Hand Pain     (Consider location/radiation/quality/duration/timing/severity/associated sxs/prior Treatment) HPI Comments: Patient is a 73 yo F PMHx significant for diabetes mellitus, hyperlipidemia, hypertension, A. fib on Coumadin presenting to the emergency department for right hand and arm swelling and redness that began Saturday. She states his be getting progressively worse since then. She denies any known injuries or insect bite. She denies any pain, only states it is itching. She states she has been scratching the skin and is caused some openings denies any purulent drainage. Denies any fevers, chills, CP, SOB, nausea, vomiting, diarrhea. Patient is right hand dominant.    Past Medical History  Diagnosis Date  . Blindness of left eye   . Eye muscle weakness     Right eye weakness after cataract surgery  . Common migraine     ?hx of  . DIABETES MELLITUS, TYPE II 03/27/2006    dr [name]  . HYPERLIPIDEMIA 03/27/2006  . 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 HYPERTENSION 03/27/2006  . Atrial fibrillation     SVT dx 2007, cath 2007 mild CAD, then had an cardioversion, ablation; still on coumadin , has occ palpitation, EKG 03-2010 NSR  . Headache(784.0) 05/07/2009  . LUNG NODULE 09/01/2006    Excision, Bx Benighn  . Osteopenia 2004    Dexa 2004 showed Osteopenia, DEXA 03/2007 normal  . Seizures   . Intertrochanteric fracture of right hip 07/13/2012  . 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 GERD (gastroesophageal reflux disease) 10/05/2011   Past Surgical History  Procedure Laterality Date  . Shoulder surgery    . Femur im nail Right 07/15/2012    Procedure: INTRAMEDULLARY (IM) NAIL HIP;  Surgeon: Johnny Bridge, MD;  Location: [facility];  Service: Orthopedics;  Laterality: Right;   Family History  Problem Relation Age of Onset  .  Diabetes Father   . Heart attack Father 44  . Diabetes Sister   . Cancer Neg Hx     no hx of colon or breast cancer   History  Substance Use Topics  . Smoking status: Former Smoker    Types: Cigarettes    Quit date: 03/02/1978  . Smokeless tobacco: Never Used  . Alcohol Use: No   OB History    No data available     Review of Systems  Musculoskeletal: Positive for joint swelling.  Skin: Positive for color change and wound.  All other systems reviewed and are negative.     Allergies  Procaine hcl  Home Medications   Prior to Admission medications   Medication Sig Start Date End Date Taking? Authorizing Provider  atorvastatin (LIPITOR) 40 MG tablet Take 1 tablet daily at bedtime. 11/30/13   Colon Branch, MD  calcium-vitamin D (OSCAL WITH D) 500-200 MG-UNIT per tablet Take 1 tablet by mouth daily.    Historical Provider, MD  docusate sodium 100 MG CAPS Take 100 mg by mouth 2 (two) times daily. 07/17/12   Donne Hazel, MD  escitalopram (LEXAPRO) 10 MG tablet Take 1 tablet (10 mg total) by mouth daily. 01/05/14   Colon Branch, MD  insulin NPH-regular Human (NOVOLIN 70/30 RELION) (70-30) 100 UNIT/ML injection 38 units with breakfast, and 18 units with the evening meal 04/24/13   Renato Shin, MD  LORazepam (ATIVAN) 0.5 MG tablet Take 1  tablet (0.5 mg total) by mouth 2 (two) times daily as needed for anxiety. 11/14/13   Colon Branch, MD  metoprolol tartrate (LOPRESSOR) 25 MG tablet TAKE ONE-HALF TABLET BY MOUTH TWICE DAILY 10/18/13   Colon Branch, MD  NOVOLIN 70/30 RELION (70-30) 100 UNIT/ML injection INJECT 50 UNITS SUBCUTANEOUSLY ONCE DAILY WITH BREAKFAST 12/04/13   Renato Shin, MD  phenytoin (DILANTIN) 100 MG ER capsule Take 3 capsules (300 mg total) by mouth daily. 11/14/13   Antony Contras, MD  phytonadione (VITAMIN K) 5 MG tablet Take 5 mg by mouth once. Take 1/2 tablet by mouth now (PRN) 06/30/13   Peter M Martinique, MD  warfarin (COUMADIN) 2.5 MG tablet Take as directed by anticoagulation  clinic 09/06/13   Evans Lance, MD   Pulse 75  Temp(Src) 97.6 F (36.4 C) (Oral)  Resp 16  Ht 5\' 9"  (1.753 m)  Wt 214 lb (97.07 kg)  BMI 31.59 kg/m2  SpO2 96% Physical Exam  Constitutional: She is oriented to person, place, and time. She appears well-developed and well-nourished. No distress.  HENT:  Head: Normocephalic and atraumatic.  Right Ear: External ear normal.  Left Ear: External ear normal.  Nose: Nose normal.  Mouth/Throat: Oropharynx is clear and moist.  Eyes: Conjunctivae are normal.  Neck: Normal range of motion. Neck supple.  Cardiovascular: Normal rate, regular rhythm, normal heart sounds and intact distal pulses.   Pulmonary/Chest: Effort normal.  Abdominal: Soft.  Musculoskeletal:       Right wrist: She exhibits swelling.       Right forearm: She exhibits swelling. She exhibits no tenderness.       Arms:      Right hand: She exhibits decreased range of motion and swelling. She exhibits no tenderness, normal capillary refill and no deformity. Normal sensation noted.  See pictures below.  Right forearm and hand warm and erythematous with abrasions. No fluctuance or induration.  Neurological: She is alert and oriented to person, place, and time.  Skin: Skin is warm and dry. She is not diaphoretic. There is erythema.  Psychiatric: She has a normal mood and affect.  Nursing note and vitals reviewed.   ED Course  Procedures (including critical care time) Medications  warfarin (COUMADIN) tablet 2.5 mg (not administered)  diphenhydrAMINE (BENADRYL) injection 25 mg (25 mg Intravenous Given 01/22/14 1643)  clindamycin (CLEOCIN) IVPB 600 mg (0 mg Intravenous Stopped 01/22/14 1759)  Tdap (BOOSTRIX) injection 0.5 mL (0.5 mLs Intramuscular Given 01/22/14 1719)    Labs Review Labs Reviewed - No data to display  Imaging Review No results found.   EKG Interpretation None             9:26 PM Patient evaluated prior to transfer. No complaints. NAD.   MDM    Final diagnoses:  None    Filed Vitals:   01/22/14 2125  BP: 142/47  Pulse: 71  Temp:   Resp: 16   Afebrile, NAD, non-toxic appearing, AAOx4.  Neurovascularly intact. Normal sensation. No evidence of compartment syndrome. Patient with right hand and forearm cellulitic infection. No fluctuance or induration to suggest abscess. I have reviewed nursing notes, vital signs, and all appropriate lab and imaging results for this patient. IV Clindamycin initiated. Home dose of Warfarin ordered. Given extent of cellulitis to right hand and forearm will admit patient to the hospital at [facility] for further treatment and management of cellulitis. Patient d/w with Dr. Winfred Leeds, agrees with plan.  Harlow Mares, PA-C 01/22/14 2139  Orlie Dakin, MD 01/22/14 2222

## 2014-01-22 NOTE — ED Notes (Signed)
Patient is being transported by [facility]  To room [phone]

## 2014-01-22 NOTE — ED Notes (Signed)
Pt c/o righ hand and arm pain redness and swelling. Pt denies injury or insect bite.

## 2014-01-22 NOTE — H&P (Addendum)
[facility] History and Physical  [place] [id number] DOB: 1940/03/06 DOA: 01/22/2014  Referring physician: EDP PCP: [name], MD   Chief Complaint: Cellulitis of hand   HPI: [name] [name] is a 73 y.o. female h/o DM, A.Fib on coumadin, presents to ED with R hand and arm swelling, redness, pain.  Symptoms onset Sat, progressively worsening, no known injuries or insect bites.  Is Itching and she has been scratching at it since Sat.  No fevers, chills, CP, SOB.  ROM is limited in the fingers in the hand due to edema but patient states this has already improved some since being started on clindamycin in the ED.  Review of Systems: Systems reviewed.  As above, otherwise negative  Past Medical History  Diagnosis Date  . Blindness of left eye   . Eye muscle weakness     Right eye weakness after cataract surgery  . Common migraine     ?hx of  . DIABETES MELLITUS, TYPE II 03/27/2006    dr [name]  . HYPERLIPIDEMIA 03/27/2006  . 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 HYPERTENSION 03/27/2006  . Atrial fibrillation     SVT dx 2007, cath 2007 mild CAD, then had an cardioversion, ablation; still on coumadin , has occ palpitation, EKG 03-2010 NSR  . Headache(784.0) 05/07/2009  . LUNG NODULE 09/01/2006    Excision, Bx Benighn  . Osteopenia 2004    Dexa 2004 showed Osteopenia, DEXA 03/2007 normal  . Seizures   . Intertrochanteric fracture of right hip 07/13/2012  . 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 GERD (gastroesophageal reflux disease) 10/05/2011   Past Surgical History  Procedure Laterality Date  . Shoulder surgery    . Femur im nail Right 07/15/2012    Procedure: INTRAMEDULLARY (IM) NAIL HIP;  Surgeon: Johnny Bridge, MD;  Location: [facility];  Service: Orthopedics;  Laterality: Right;   Social History:  reports that she quit smoking about 35 years ago. Her smoking use included Cigarettes. She smoked 0.00 packs per day. She has never used smokeless tobacco. She reports that she  does not drink alcohol or use illicit drugs.  Allergies  Allergen Reactions  . Procaine Hcl Anaphylaxis    Family History  Problem Relation Age of Onset  . Diabetes Father   . Heart attack Father 47  . Diabetes Sister   . Cancer Neg Hx     no hx of colon or breast cancer     Prior to Admission medications   Medication Sig Start Date End Date Taking? Authorizing Provider  atorvastatin (LIPITOR) 40 MG tablet Take 1 tablet daily at bedtime. 11/30/13   Colon Branch, MD  calcium-vitamin D (OSCAL WITH D) 500-200 MG-UNIT per tablet Take 1 tablet by mouth daily.    Historical Provider, MD  docusate sodium 100 MG CAPS Take 100 mg by mouth 2 (two) times daily. 07/17/12   Donne Hazel, MD  escitalopram (LEXAPRO) 10 MG tablet Take 1 tablet (10 mg total) by mouth daily. 01/05/14   Colon Branch, MD  insulin NPH-regular Human (NOVOLIN 70/30 RELION) (70-30) 100 UNIT/ML injection 38 units with breakfast, and 18 units with the evening meal 04/24/13   Renato Shin, MD  LORazepam (ATIVAN) 0.5 MG tablet Take 1 tablet (0.5 mg total) by mouth 2 (two) times daily as needed for anxiety. 11/14/13   Colon Branch, MD  metoprolol tartrate (LOPRESSOR) 25 MG tablet TAKE ONE-HALF TABLET BY MOUTH TWICE DAILY 10/18/13   Colon Branch, MD  NOVOLIN 70/30 RELION (70-30) 100 UNIT/ML injection INJECT 50 UNITS SUBCUTANEOUSLY ONCE DAILY WITH BREAKFAST 12/04/13   Renato Shin, MD  phenytoin (DILANTIN) 100 MG ER capsule Take 3 capsules (300 mg total) by mouth daily. 11/14/13   Antony Contras, MD  phytonadione (VITAMIN K) 5 MG tablet Take 5 mg by mouth once. Take 1/2 tablet by mouth now (PRN) 06/30/13   Peter M Martinique, MD  warfarin (COUMADIN) 2.5 MG tablet Take as directed by anticoagulation clinic 09/06/13   Evans Lance, MD   Physical Exam: Filed Vitals:   01/22/14 2215  BP: 161/58  Pulse: 73  Temp: 97.9 F (36.6 C)  Resp: 18    BP 161/58 mmHg  Pulse 73  Temp(Src) 97.9 F (36.6 C) (Oral)  Resp 18  Ht 5\' 9"  (1.753 m)  Wt 95.664 kg  (210 lb 14.4 oz)  BMI 31.13 kg/m2  SpO2 98%  General Appearance:    Alert, oriented, no distress, appears stated age  Head:    Normocephalic, atraumatic  Eyes:    PERRL, EOMI, sclera non-icteric        Nose:   Nares without drainage or epistaxis. Mucosa, turbinates normal  Throat:   Moist mucous membranes. Oropharynx without erythema or exudate.  Neck:   Supple. No carotid bruits.  No thyromegaly.  No lymphadenopathy.   Back:     No CVA tenderness, no spinal tenderness  Lungs:     Clear to auscultation bilaterally, without wheezes, rhonchi or rales  Chest wall:    No tenderness to palpitation  Heart:    Regular rate and rhythm without murmurs, gallops, rubs  Abdomen:     Soft, non-tender, nondistended, normal bowel sounds, no organomegaly  Genitalia:    deferred  Rectal:    deferred  Extremities:   No clubbing, cyanosis or edema.  Pulses:   2+ and symmetric all extremities, good cap refill in the affected hand  Skin:   There is erythema, edema, tenderness, to the RUE, no obvious abscess or fluid collection, see also EDPs note which has picture images of the cellulitic area.  Lymph nodes:   Cervical, supraclavicular, and axillary nodes normal  Neurologic:   CNII-XII intact. Normal strength, sensation and reflexes      throughout    Labs on Admission:  Basic Metabolic Panel:  Recent Labs Lab 01/22/14 1634  NA 136*  K 4.4  CL 95*  CO2 27  GLUCOSE 288*  BUN 13  CREATININE 1.00  CALCIUM 9.2   Liver Function Tests: No results for input(s): AST, ALT, ALKPHOS, BILITOT, PROT, ALBUMIN in the last 168 hours. No results for input(s): LIPASE, AMYLASE in the last 168 hours. No results for input(s): AMMONIA in the last 168 hours. CBC:  Recent Labs Lab 01/22/14 1634  WBC 13.0*  NEUTROABS 9.4*  HGB 11.9*  HCT 36.7  MCV 94.3  PLT 343   Cardiac Enzymes: No results for input(s): CKTOTAL, CKMB, CKMBINDEX, TROPONINI in the last 168 hours.  BNP (last 3 results) No results for  input(s): PROBNP in the last 8760 hours. CBG: No results for input(s): GLUCAP in the last 168 hours.  Radiological Exams on Admission: No results found.  EKG: Independently reviewed.  Assessment/Plan Active Problems:   Cellulitis of right hand excluding fingers and thumb   1. Cellulitis of R hand and forearm - continue clindamycin that was started in ED.  No obvious area of abscess, induration, or fluctuance at this point to suggest a surgical target for drainage,  if this changes then may wish to consult hand surgery.  Have ordered US of hand to look for any evidence of fluid collection, abscess, or other surgical target. 2. DM - continue home 70/30, carb mod diet, CBG checks AC/HS 3. A.Fib - continue coumadin  Code Status: Full Code  Family Communication: No family in room Disposition Plan: Admit to inpatient   Time spent: 70 min  GARDNER, JARED M. [facility] Pager [phone]  If 7AM-7PM, please contact the day team taking care of the patient [email or web address] Password [email or web address] 01/22/2014, 10:45 PM

## 2014-01-23 ENCOUNTER — Inpatient Hospital Stay (HOSPITAL_COMMUNITY): Payer: Medicare Other

## 2014-01-23 DIAGNOSIS — E119 Type 2 diabetes mellitus without complications: Secondary | ICD-10-CM

## 2014-01-23 DIAGNOSIS — L03113 Cellulitis of right upper limb: Secondary | ICD-10-CM | POA: Diagnosis present

## 2014-01-23 DIAGNOSIS — I4891 Unspecified atrial fibrillation: Secondary | ICD-10-CM

## 2014-01-23 LAB — GLUCOSE, CAPILLARY
GLUCOSE-CAPILLARY: 332 mg/dL — AB (ref 70–99)
GLUCOSE-CAPILLARY: 186 mg/dL — AB (ref 70–99)
Glucose-Capillary: 265 mg/dL — ABNORMAL HIGH (ref 70–99)
Glucose-Capillary: 133 mg/dL — ABNORMAL HIGH (ref 70–99)
Glucose-Capillary: 80 mg/dL (ref 70–99)

## 2014-01-23 LAB — PHENYTOIN LEVEL, TOTAL: Phenytoin Lvl: 4.9 ug/mL — ABNORMAL LOW (ref 10.0–20.0)

## 2014-01-23 LAB — BASIC METABOLIC PANEL
Anion gap: 15 (ref 5–15)
BUN: 11 mg/dL (ref 6–23)
CO2: 23 mEq/L (ref 19–32)
Calcium: 8.3 mg/dL — ABNORMAL LOW (ref 8.4–10.5)
Chloride: 97 mEq/L (ref 96–112)
Creatinine, Ser: 0.88 mg/dL (ref 0.50–1.10)
GFR calc Af Amer: 74 mL/min — ABNORMAL LOW (ref >90)
GFR, EST NON AFRICAN AMERICAN: 64 mL/min — AB (ref >90)
GLUCOSE: 282 mg/dL — AB (ref 70–99)
POTASSIUM: 4.1 meq/L (ref 3.7–5.3)
SODIUM: 135 meq/L — AB (ref 137–147)

## 2014-01-23 LAB — CBC
HCT: 35.3 % — ABNORMAL LOW (ref 36.0–46.0)
HEMOGLOBIN: 11.7 g/dL — AB (ref 12.0–15.0)
MCH: 30.5 pg (ref 26.0–34.0)
MCHC: 33.1 g/dL (ref 30.0–36.0)
MCV: 92.2 fL (ref 78.0–100.0)
PLATELETS: 308 10*3/uL (ref 150–400)
RBC: 3.83 MIL/uL — ABNORMAL LOW (ref 3.87–5.11)
RDW: 13 % (ref 11.5–15.5)
WBC: 10.8 10*3/uL — ABNORMAL HIGH (ref 4.0–10.5)

## 2014-01-23 LAB — HEMOGLOBIN A1C
HEMOGLOBIN A1C: 6.7 % — AB (ref <5.7)
Mean Plasma Glucose: 146 mg/dL — ABNORMAL HIGH (ref <117)

## 2014-01-23 MED ORDER — WARFARIN - PHARMACIST DOSING INPATIENT
Freq: Every day | Status: DC
  Administered 2014-01-23: 17:00:00

## 2014-01-23 MED ORDER — WARFARIN SODIUM 3 MG PO TABS
3.0000 mg | ORAL_TABLET | Freq: Once | ORAL | Status: AC
  Administered 2014-01-23: 3 mg via ORAL
  Filled 2014-01-23: qty 1

## 2014-01-23 MED ORDER — WARFARIN SODIUM 2.5 MG PO TABS: 2.5000 mg | ORAL_TABLET | Freq: Every day | ORAL | Status: DC

## 2014-01-23 NOTE — Plan of Care (Signed)
Problem: Phase II Progression Outcomes Goal: Temperature < 101 Outcome: Progressing     

## 2014-01-23 NOTE — Progress Notes (Signed)
RN paged secondary to tele reading 1st degree AV block. Pt is non symptomatic. Chart reviewed and 1st degree AV block noted on an EKG from Sept, 2015. Will recheck EKG since pt hasn't had one during this admission. Clance Boll, NP [facility]

## 2014-01-23 NOTE — Progress Notes (Signed)
PROGRESS NOTE  [name] [name] [id number] DOB: 07/19/1940 DOA: 01/22/2014 PCP: [name], MD  Assessment/Plan: 73 yo female with history of DM and Afib on coumadin presented to the ED with right hand and fore arm swelling, erythema and pain.  She states symptoms started on Saturday.  It is puritic and she has been scratching it.  She denies fever, chills, previous antibiotics.   Right upper extremity cellulitis Started on clindamycin in the ER.  WBC is trending down.  Will not order blood cultures at this point. Upper Extremity U/S negative for abscess.  So will continue to treat with antibiotics and supportive care.  Hand surgeon consultation considered but not needed at this time as there is no osteo, no abscess. RUE Duplex ordered to rule out DVT.  DM II A1C in 09/2013 was 7.2. On 70/30 at home and inpatient.  Will add SSI Adjust 70/30 dosing on d/c.  A fib Rate controlled on metoprolol Coumadin per pharmacy INR currently 1.81  Seizures Continue Phenytoin Hx of dilantin toxicity.  Will check a level.  HLD Continue Statin.   DVT Prophylaxis:  coumadin  Code Status: full Family Communication: patient is alert and orientated. Disposition Plan: to home when appropriate.   Consultants:  none  Procedures:  None.   Duplex pending.  Antibiotics: Anti-infectives    Start     Dose/Rate Route Frequency Ordered Stop   01/22/14 2330  clindamycin (CLEOCIN) IVPB 600 mg     600 mg100 mL/hr over 30 Minutes Intravenous 3 times per day 01/22/14 2242     01/22/14 1645  clindamycin (CLEOCIN) IVPB 600 mg     600 mg100 mL/hr over 30 Minutes Intravenous  Once 01/22/14 1632 01/22/14 1759        HPI/Subjective: Patient reports hand itches and requests something for itch.  Objective: Filed Vitals:   01/22/14 2216 01/23/14 0115 01/23/14 0625 01/23/14 1346  BP:  160/62 155/53 152/53  Pulse:  75 71 63  Temp:   98.3 F (36.8 C) 99.1 F (37.3 C)  TempSrc:   Oral Oral   Resp:   16 20  Height: 5\' 9"  (1.753 m)     Weight: 95.664 kg (210 lb 14.4 oz)     SpO2:   98% 98%    Intake/Output Summary (Last 24 hours) at 01/23/14 1518 Last data filed at 01/23/14 1507  Gross per 24 hour  Intake    612 ml  Output      0 ml  Net    612 ml   Filed Weights   01/22/14 1542 01/22/14 2216  Weight: 97.07 kg (214 lb) 95.664 kg (210 lb 14.4 oz)    Exam: General: Well developed, well nourished, NAD, appears stated age  25:  PERR, EOMI, Anicteic Sclera, MMM. No pharyngeal erythema or exudates  Neck: Supple, no JVD, no masses  Cardiovascular: RRR, S1 S2 auscultated, no rubs, murmurs or gallops.   Respiratory: Clear to auscultation bilaterally with equal chest rise  Abdomen: Soft, nontender, nondistended, + bowel sounds  Extremities: warm dry without cyanosis clubbing or edema.  Neuro: AAOx3, cranial nerves grossly intact. Strength 5/5 in upper and lower extremities  Skin: Right upper extremity with swollen digits, hand, and forearm.  Small scabbed areas on the dorsum of the hand and arm.  Decreased range of motion in the digits and wrist. Psych: Normal affect and demeanor with intact judgement and insight       Data Reviewed: Basic Metabolic Panel:  Recent  Labs Lab 01/22/14 1634 22-Feb-2014 0548  NA 136* 135*  K 4.4 4.1  CL 95* 97  CO2 27 23  GLUCOSE 288* 282*  BUN 13 11  CREATININE 1.00 0.88  CALCIUM 9.2 8.3*   CBC:  Recent Labs Lab 01/22/14 1634 02-22-14 0548  WBC 13.0* 10.8*  NEUTROABS 9.4*  --   HGB 11.9* 11.7*  HCT 36.7 35.3*  MCV 94.3 92.2  PLT 343 308   CBG:  Recent Labs Lab 02/22/14 0122 02-22-2014 0808 02-22-14 1159  GLUCAP 332* 265* 186*      Studies: [place] Extrem Up Right Ltd  02-22-2014   CLINICAL DATA:  Cellulitis of right hand. Possible insect bite on wrist. Redness and swelling of hand and wrist.  EXAM: ULTRASOUND RIGHT UPPER EXTREMITY LIMITED  TECHNIQUE: Ultrasound examination of the upper extremity soft tissues was  performed in the area of clinical concern.  COMPARISON:  None.  FINDINGS: There is edema throughout the subcutaneous soft tissues. No focal fluid collection to suggest abscess.  IMPRESSION: Subcutaneous edema suggesting cellulitis.  No focal abscess.   Electronically Signed   By: Rolm Baptise M.D.   On: 22-Feb-2014 09:46    Scheduled Meds: . atorvastatin  40 mg Oral q1800  . calcium-vitamin D  1 tablet Oral Daily  . clindamycin (CLEOCIN) IV  600 mg Intravenous 3 times per day  . docusate sodium  100 mg Oral BID  . escitalopram  10 mg Oral Daily  . insulin aspart protamine- aspart  18 Units Subcutaneous Q supper  . insulin aspart protamine- aspart  38 Units Subcutaneous Q breakfast  . metoprolol tartrate  12.5 mg Oral BID  . phenytoin  300 mg Oral Daily  . sodium chloride  3 mL Intravenous Q12H  . warfarin  2.5 mg Oral Once  . [START ON 01/25/2014] warfarin  2.5 mg Oral q1800  . warfarin  3 mg Oral ONCE-1800  . Warfarin - Pharmacist Dosing Inpatient   Does not apply q1800   Continuous Infusions:   Principal Problem:   Cellulitis of right hand Active Problems:   Seizure disorder   Diabetes mellitus   A-fib    Karen Kitchens  [facility] Pager [phone]. If 7PM-7AM, please contact night-coverage at [email or web address], password [email or web address] Feb 22, 2014, 3:18 PM  LOS: 1 day

## 2014-01-23 NOTE — Care Management Note (Signed)
    Page 1 of 1   01/24/2014     3:45:25 PM CARE MANAGEMENT NOTE 01/24/2014  Patient:  [name] [name], [name] [name]   Account Number:  [id number]  Date Initiated:  01/23/2014  Documentation initiated by:  Tomi Bamberger  Subjective/Objective Assessment:   dx hand swelling  admit- lives with spouse     Action/Plan:   Anticipated DC Date:  01/24/2014   Anticipated DC Plan:  [place]  CM consult      Choice offered to / List presented to:             Status of service:  Completed, signed off Medicare Important Message given?  YES (If response is "NO", the following Medicare IM given date fields will be blank) Date Medicare IM given:  01/23/2014 Medicare IM given by:  Tomi Bamberger Date Additional Medicare IM given:   Additional Medicare IM given by:    Discharge Disposition:  HOME/SELF CARE  Per UR Regulation:  Reviewed for med. necessity/level of care/duration of stay  If discussed at [place] of Stay Meetings, dates discussed:    Comments:  01/22/14 [place], BSN [phone] patient is for possible dc tomorrow, NCM will continue to follow for dc needs.

## 2014-01-23 NOTE — Progress Notes (Signed)
ANTICOAGULATION CONSULT NOTE - Initial Consult  Pharmacy Consult for coumadin  Indication: atrial fibrillation  Allergies  Allergen Reactions  . Procaine Hcl Anaphylaxis    Patient Measurements: Height: 5\' 9"  (175.3 cm) Weight: 210 lb 14.4 oz (95.664 kg) IBW/kg (Calculated) : 66.2 Heparin Dosing Weight:    Vital Signs: Temp: 97.9 F (36.6 C) (11/23 2215) Temp Source: Oral (11/23 2215) BP: 160/62 mmHg (11/24 0115) Pulse Rate: 75 (11/24 0115)  Labs:  Recent Labs  01/22/14 1634  HGB 11.9*  HCT 36.7  PLT 343  LABPROT 21.0*  INR 1.81*  CREATININE 1.00    Estimated Creatinine Clearance: 62.6 mL/min (by C-G formula based on Cr of 1).   Medical History: Past Medical History  Diagnosis Date  . Blindness of left eye   . Eye muscle weakness     Right eye weakness after cataract surgery  . Common migraine     ?hx of  . DIABETES MELLITUS, TYPE II 03/27/2006    dr [name]  . HYPERLIPIDEMIA 03/27/2006  . 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 HYPERTENSION 03/27/2006  . Atrial fibrillation     SVT dx 2007, cath 2007 mild CAD, then had an cardioversion, ablation; still on coumadin , has occ palpitation, EKG 03-2010 NSR  . Headache(784.0) 05/07/2009  . LUNG NODULE 09/01/2006    Excision, Bx Benighn  . Osteopenia 2004    Dexa 2004 showed Osteopenia, DEXA 03/2007 normal  . Seizures   . Intertrochanteric fracture of right hip 07/13/2012  . 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 GERD (gastroesophageal reflux disease) 10/05/2011    Medications:  Prescriptions prior to admission  Medication Sig Dispense Refill Last Dose  . atorvastatin (LIPITOR) 40 MG tablet Take 1 tablet daily at bedtime. 30 tablet 2 Taking  . calcium-vitamin D (OSCAL WITH D) 500-200 MG-UNIT per tablet Take 1 tablet by mouth daily.   Taking  . docusate sodium 100 MG CAPS Take 100 mg by mouth 2 (two) times daily. 10 capsule 0 Taking  . escitalopram (LEXAPRO) 10 MG tablet Take 1 tablet (10 mg total) by mouth daily. 90  tablet 2   . insulin NPH-regular Human (NOVOLIN 70/30 RELION) (70-30) 100 UNIT/ML injection 38 units with breakfast, and 18 units with the evening meal   Taking  . LORazepam (ATIVAN) 0.5 MG tablet Take 1 tablet (0.5 mg total) by mouth 2 (two) times daily as needed for anxiety. 60 tablet 2 Taking  . metoprolol tartrate (LOPRESSOR) 25 MG tablet TAKE ONE-HALF TABLET BY MOUTH TWICE DAILY 90 tablet 1 Taking  . NOVOLIN 70/30 RELION (70-30) 100 UNIT/ML injection INJECT 50 UNITS SUBCUTANEOUSLY ONCE DAILY WITH BREAKFAST 20 mL 1 Taking  . phenytoin (DILANTIN) 100 MG ER capsule Take 3 capsules (300 mg total) by mouth daily. 120 capsule 3 Taking  . phytonadione (VITAMIN K) 5 MG tablet Take 5 mg by mouth once. Take 1/2 tablet by mouth now (PRN)   Not Taking  . warfarin (COUMADIN) 2.5 MG tablet Take as directed by anticoagulation clinic 40 tablet 3 Taking    Assessment: Coumadin to continue for afib. Pt inr is 1.81 on adx. Now asleep. INR down to 1.7 is acceptable for afib . Will begin dosing tonight at 1800 Goal of Therapy:  INR 2-3 Monitor platelets by anticoagulation protocol: Yes   Plan:  Continue coumadin clinic dosing except give 3 mg today at 1800 then 2.5mg  q1800.  INR daily starting 11/25 with am labs.   Curlene Dolphin 01/23/2014,2:06 AM

## 2014-01-23 NOTE — Progress Notes (Signed)
Inpatient Diabetes Program Recommendations  AACE/ADA: New Consensus Statement on Inpatient Glycemic Control (2013)  Target Ranges:  Prepandial:   less than 140 mg/dL      Peak postprandial:   less than 180 mg/dL (1-2 hours)      Critically ill patients:  140 - 180 mg/dL   Results for KAMYLLE, DIDIER (MRN [id number]) as of 01/23/2014 11:09  Ref. Range 01/23/2014 01:22 01/23/2014 08:08  Glucose-Capillary Latest Range: 70-99 mg/dL 332 (H) 265 (H)   Diabetes history: DM2 Outpatient Diabetes medications: 70/30 38 units QAM with breakfast, 70/30 18 units QPM with supper Current orders for Inpatient glycemic control: 70/30 38 units QAM with breakfast, 70/30 18 units QPM with supper  Inpatient Diabetes Program Recommendations Correction (SSI): While inpatient, please consider ordering Novolog correction scale ACHS.  Thanks, Barnie Alderman, RN, MSN, CCRN, CDE Diabetes Coordinator Inpatient Diabetes Program [phone] (Team Pager) [phone] (AP office) [phone] [facility] office)

## 2014-01-23 NOTE — Progress Notes (Signed)
Pt arrived to unit via stretcher. Pt was oriented to room and call bell. VS were taken. Pt in no apparent distress at this time. Awaiting further orders from provider on call. Safety video has been watched. Will continue to monitor pt.

## 2014-01-24 DIAGNOSIS — R609 Edema, unspecified: Secondary | ICD-10-CM

## 2014-01-24 LAB — GLUCOSE, CAPILLARY
GLUCOSE-CAPILLARY: 177 mg/dL — AB (ref 70–99)
Glucose-Capillary: 247 mg/dL — ABNORMAL HIGH (ref 70–99)

## 2014-01-24 LAB — CBC
HCT: 35.3 % — ABNORMAL LOW (ref 36.0–46.0)
Hemoglobin: 11.6 g/dL — ABNORMAL LOW (ref 12.0–15.0)
MCH: 29.7 pg (ref 26.0–34.0)
MCHC: 32.9 g/dL (ref 30.0–36.0)
MCV: 90.3 fL (ref 78.0–100.0)
Platelets: 346 10*3/uL (ref 150–400)
RBC: 3.91 MIL/uL (ref 3.87–5.11)
RDW: 12.7 % (ref 11.5–15.5)
WBC: 9.9 10*3/uL (ref 4.0–10.5)

## 2014-01-24 LAB — PROTIME-INR
INR: 1.79 — ABNORMAL HIGH (ref 0.00–1.49)
Prothrombin Time: 21 seconds — ABNORMAL HIGH (ref 11.6–15.2)

## 2014-01-24 MED ORDER — CLINDAMYCIN HCL 300 MG PO CAPS: 600.0000 mg | ORAL_CAPSULE | Freq: Three times a day (TID) | ORAL | Status: DC

## 2014-01-24 MED ORDER — WARFARIN SODIUM 4 MG PO TABS
4.0000 mg | ORAL_TABLET | Freq: Once | ORAL | Status: DC
  Filled 2014-01-24: qty 1

## 2014-01-24 NOTE — Progress Notes (Signed)
Right upper extremity venous duplex completed:  No evidence of DVT or superficial thrombosis.    

## 2014-01-24 NOTE — Discharge Instructions (Signed)
Keep your arm elevated as much as possible at home until the swelling is gone.   Please take your antibiotics every day as prescribed.  Please call to make an appt to see Dr. Larose Kells on Monday.   Information on my medicine - Coumadin   (Warfarin)  Why was Coumadin prescribed for you? Coumadin was prescribed for you because you have a blood clot or a medical condition that can cause an increased risk of forming blood clots. Blood clots can cause serious health problems by blocking the flow of blood to the heart, lung, or brain. Coumadin can prevent harmful blood clots from forming. As a reminder your indication for Coumadin is:   Select from menu  What test will check on my response to Coumadin? While on Coumadin (warfarin) you will need to have an INR test regularly to ensure that your dose is keeping you in the desired range. The INR (international normalized ratio) number is calculated from the result of the laboratory test called prothrombin time (PT).  If an INR APPOINTMENT HAS NOT ALREADY BEEN MADE FOR YOU please schedule an appointment to have this lab work done by your health care provider within 7 days. Your INR goal is usually a number between:  2 to 3 or your provider may give you a more narrow range like 2-2.5.  Ask your health care provider during an office visit what your goal INR is.  What  do you need to  know  About  COUMADIN? Take Coumadin (warfarin) exactly as prescribed by your healthcare provider about the same time each day.  DO NOT stop taking without talking to the doctor who prescribed the medication.  Stopping without other blood clot prevention medication to take the place of Coumadin may increase your risk of developing a new clot or stroke.  Get refills before you run out.  What do you do if you miss a dose? If you miss a dose, take it as soon as you remember on the same day then continue your regularly scheduled regimen the next day.  Do not take two doses of Coumadin at  the same time.  Important Safety Information A possible side effect of Coumadin (Warfarin) is an increased risk of bleeding. You should call your healthcare provider right away if you experience any of the following: ? Bleeding from an injury or your nose that does not stop. ? Unusual colored urine (red or dark brown) or unusual colored stools (red or black). ? Unusual bruising for unknown reasons. ? A serious fall or if you hit your head (even if there is no bleeding).  Some foods or medicines interact with Coumadin (warfarin) and might alter your response to warfarin. To help avoid this: ? Eat a balanced diet, maintaining a consistent amount of Vitamin K. ? Notify your provider about major diet changes you plan to make. ? Avoid alcohol or limit your intake to 1 drink for women and 2 drinks for men per day. (1 drink is 5 oz. wine, 12 oz. beer, or 1.5 oz. liquor.)  Make sure that ANY health care provider who prescribes medication for you knows that you are taking Coumadin (warfarin).  Also make sure the healthcare provider who is monitoring your Coumadin knows when you have started a new medication including herbals and non-prescription products.  Coumadin (Warfarin)  Major Drug Interactions  Increased Warfarin Effect Decreased Warfarin Effect  Alcohol (large quantities) Antibiotics (esp. Septra/Bactrim, Flagyl, Cipro) Amiodarone (Cordarone) Aspirin (ASA) Cimetidine (Tagamet) Megestrol (Megace)  NSAIDs (ibuprofen, naproxen, etc.) Piroxicam (Feldene) Propafenone (Rythmol SR) Propranolol (Inderal) Isoniazid (INH) Posaconazole (Noxafil) Barbiturates (Phenobarbital) Carbamazepine (Tegretol) Chlordiazepoxide (Librium) Cholestyramine (Questran) Griseofulvin Oral Contraceptives Rifampin Sucralfate (Carafate) Vitamin K   Coumadin (Warfarin) Major Herbal Interactions  Increased Warfarin Effect Decreased Warfarin Effect  Garlic Ginseng Ginkgo biloba Coenzyme Q10 Green tea St.  Johns wort    Coumadin (Warfarin) FOOD Interactions  Eat a consistent number of servings per week of foods HIGH in Vitamin K (1 serving =  cup)  Collards (cooked, or boiled & drained) Kale (cooked, or boiled & drained) Mustard greens (cooked, or boiled & drained) Parsley *serving size only =  cup Spinach (cooked, or boiled & drained) Swiss chard (cooked, or boiled & drained) Turnip greens (cooked, or boiled & drained)  Eat a consistent number of servings per week of foods MEDIUM-HIGH in Vitamin K (1 serving = 1 cup)  Asparagus (cooked, or boiled & drained) Broccoli (cooked, boiled & drained, or raw & chopped) Brussel sprouts (cooked, or boiled & drained) *serving size only =  cup Lettuce, raw (green leaf, endive, romaine) Spinach, raw Turnip greens, raw & chopped   These websites have more information on Coumadin (warfarin):  FailFactory.se; VeganReport.com.au;

## 2014-01-24 NOTE — Progress Notes (Signed)
Paged provider on call about pt's tele strip reading 1st degree heartblock. Pt is asymptomatic. Pt to get 12 lead EKG which showed NSR.

## 2014-01-24 NOTE — Plan of Care (Signed)
Problem: Discharge Progression Outcomes Goal: Discharge plan in place and appropriate Outcome: Completed/Met Date Met:  01/24/14 Goal: Pain controlled with appropriate interventions Outcome: Adequate for Discharge Goal: Hemodynamically stable Outcome: Adequate for Discharge Goal: Tolerating diet Outcome: Adequate for Discharge Goal: Activity appropriate for discharge plan Outcome: Adequate for Discharge Goal: Wound improving/decreased edema Outcome: Progressing Goal: Patient verbalizes wound care regimen Outcome: Progressing

## 2014-01-24 NOTE — Progress Notes (Signed)
AVS discharge instructions were reviewed with patient. Patient was also given prescription for clindamycin to take to her pharmacy. Patient stated that she did not have any questions. Staff will assist patient to her transportation.

## 2014-01-24 NOTE — Discharge Summary (Signed)
Physician Discharge Summary  [name] [name] [id number] DOB: 29-Nov-1940 DOA: 01/22/2014  PCP: [name] November, MD  Admit date: 01/22/2014 Discharge date: 01/24/2014  Time spent: 35 minutes  Recommendations for Outpatient Follow-up:  1.  INR,  Dilantin level and right forearm check on Monday 11/30.   Discharge Diagnoses:  Principal Problem:   Cellulitis of right hand Active Problems:   Seizure disorder   Diabetes mellitus   A-fib   Discharge Condition: stable.  Diet recommendation: as tolerated.  (coumadin restrictions)              History of present illness:  73 yo female with history of DM and Afib on coumadin presented to the ED with right hand and fore arm swelling, erythema and pain. She states symptoms started on Saturday. She denies fever, chills, previous antibiotics.  Hospital Course:   Right upper extremity cellulitis Started on clindamycin in the ER. WBC has normalized.Upper Extremity U/S negative for abscess. Doppler negative for DVT.  Swelling and erythema has improved significantly on Clindamycin.  Will continue clindamycin PO for 7 more days after discharge.    DM II A1C in 09/2013 was 7.2. On 70/30 at home will continue.  A fib Rate controlled on metoprolol Coumadin per pharmacy INR currently 1.81  Seizures Continue Phenytoin Hx of dilantin toxicity. Level is currently low at 4.9.  No seizures. Will ask Dr. Larose Kells to follow.  HLD Continue Statin.  Consultations:  none  Discharge Exam: Filed Vitals:   01/23/14 2214 01/24/14 0509 01/24/14 0530 01/24/14 1020  BP:  164/65 154/70 164/66  Pulse: 66 72  78  Temp:  97.5 F (36.4 C)    TempSrc:  Oral    Resp:  18    Height:      Weight:      SpO2: 98% 96%      Filed Weights   01/22/14 1542 01/22/14 2216  Weight: 97.07 kg (214 lb) 95.664 kg (210 lb 14.4 oz)   General: Well developed, well nourished, NAD, appears stated age  53: Anicteic Sclera, MMM. No pharyngeal erythema or  exudates.  Left eye is non functional Neck: Supple, no JVD, no masses  Cardiovascular: RRR, S1 S2 auscultated, no rubs, murmurs or gallops.  Respiratory: Clear to auscultation bilaterally with equal chest rise  Abdomen: Soft, nontender, nondistended, + bowel sounds  Extremities: warm dry without cyanosis clubbing or edema.  Neuro: AAOx3, cranial nerves grossly intact. Strength 5/5 in upper and lower extremities  Skin: Right upper extremity with decreased swelling in the digits, hand, and forearm. Small scabbed areas on the dorsum of the hand and arm. Range of motion in the digits and wrist is improved today. Psych: Normal affect and demeanor with intact judgement and insight  Discharge Instructions   Discharge Instructions    Diet - low sodium heart healthy    Complete by:  As directed      Increase activity slowly    Complete by:  As directed           Current Discharge Medication List    START taking these medications   Details  clindamycin (CLEOCIN) 300 MG capsule Take 2 capsules (600 mg total) by mouth 3 (three) times daily. Qty: 42 capsule, Refills: 0      CONTINUE these medications which have NOT CHANGED   Details  atorvastatin (LIPITOR) 40 MG tablet Take 1 tablet daily at bedtime. Qty: 30 tablet, Refills: 2    calcium-vitamin D (OSCAL WITH D) 500-200 MG-UNIT per  tablet Take 1 tablet by mouth daily.    docusate sodium 100 MG CAPS Take 100 mg by mouth 2 (two) times daily. Qty: 10 capsule, Refills: 0    escitalopram (LEXAPRO) 10 MG tablet Take 1 tablet (10 mg total) by mouth daily. Qty: 90 tablet, Refills: 2    insulin NPH-regular Human (NOVOLIN 70/30 RELION) (70-30) 100 UNIT/ML injection 38 units with breakfast, and 18 units with the evening meal    metoprolol tartrate (LOPRESSOR) 25 MG tablet TAKE ONE-HALF TABLET BY MOUTH TWICE DAILY Qty: 90 tablet, Refills: 1    phenytoin (DILANTIN) 100 MG ER capsule Take 3 capsules (300 mg total) by mouth daily. Qty:  120 capsule, Refills: 3    warfarin (COUMADIN) 2.5 MG tablet Take as directed by anticoagulation clinic Qty: 40 tablet, Refills: 3      STOP taking these medications     LORazepam (ATIVAN) 0.5 MG tablet        Allergies  Allergen Reactions  . Procaine Hcl Anaphylaxis   Follow-up Information    Follow up with [name] November, MD In 5 days.   Specialty:  Internal Medicine   Why:  Please see Dr. Larose Kells on Monday to follow up on your hand, your INR and your dilantin level.   Contact information:   [place] [id number] [phone]        The results of significant diagnostics from this hospitalization (including imaging, microbiology, ancillary and laboratory) are listed below for reference.    Significant Diagnostic Studies: [place] Extrem Up Right Ltd  01/26/14   CLINICAL DATA:  Cellulitis of right hand. Possible insect bite on wrist. Redness and swelling of hand and wrist.  EXAM: ULTRASOUND RIGHT UPPER EXTREMITY LIMITED  TECHNIQUE: Ultrasound examination of the upper extremity soft tissues was performed in the area of clinical concern.  COMPARISON:  None.  FINDINGS: There is edema throughout the subcutaneous soft tissues. No focal fluid collection to suggest abscess.  IMPRESSION: Subcutaneous edema suggesting cellulitis.  No focal abscess.   Electronically Signed   By: Rolm Baptise M.D.   On: 01/26/2014 09:46   Venous Duplex of Right upper extremity 01/24/2014 No evidence of DVT or superficial thrombosis.  Labs: Basic Metabolic Panel:  Recent Labs Lab 01/22/14 1634 [name] 28, 2015 0548  NA 136* 135*  K 4.4 4.1  CL 95* 97  CO2 27 23  GLUCOSE 288* 282*  BUN 13 11  CREATININE 1.00 0.88  CALCIUM 9.2 8.3*   CBC:  Recent Labs Lab 01/22/14 1634 [name] 27, 2015 0548 01/24/14 0709  WBC 13.0* 10.8* 9.9  NEUTROABS 9.4*  --   --   HGB 11.9* 11.7* 11.6*  HCT 36.7 35.3* 35.3*  MCV 94.3 92.2 90.3  PLT 343 308 346   CBG:  Recent Labs Lab 2014-01-26 0808  01/26/2014 1159 01-26-14 1640 01/26/14 2209 01/24/14 0747  GLUCAP 265* 186* 133* 80 177*       Signed:  Melton Alar, PA-C [facility] 01/24/2014, 11:20 AM

## 2014-01-24 NOTE — Progress Notes (Addendum)
ANTICOAGULATION CONSULT NOTE - Initial Consult  Pharmacy Consult for coumadin  Indication: atrial fibrillation  Allergies  Allergen Reactions  . Procaine Hcl Anaphylaxis    Patient Measurements: Height: 5\' 9"  (175.3 cm) Weight: 210 lb 14.4 oz (95.664 kg) IBW/kg (Calculated) : 66.2 Heparin Dosing Weight:    Vital Signs: Temp: 98.1 F (36.7 C) (11/25 1326) Temp Source: Oral (11/25 1326) BP: 187/69 mmHg (11/25 1326) Pulse Rate: 78 (11/25 1020)  Labs:  Recent Labs  01/22/14 1634 01/23/14 0548 01/24/14 0709 01/24/14 0900  HGB 11.9* 11.7* 11.6*  --   HCT 36.7 35.3* 35.3*  --   PLT 343 308 346  --   LABPROT 21.0*  --   --  21.0*  INR 1.81*  --   --  1.79*  CREATININE 1.00 0.88  --   --     Estimated Creatinine Clearance: 71.2 mL/min (by C-G formula based on Cr of 0.88).   Medical History: Past Medical History  Diagnosis Date  . Blindness of left eye   . Eye muscle weakness     Right eye weakness after cataract surgery  . Common migraine     ?hx of  . DIABETES MELLITUS, TYPE II 03/27/2006    dr [name]  . HYPERLIPIDEMIA 03/27/2006  . 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 HYPERTENSION 03/27/2006  . Atrial fibrillation     SVT dx 2007, cath 2007 mild CAD, then had an cardioversion, ablation; still on coumadin , has occ palpitation, EKG 03-2010 NSR  . Headache(784.0) 05/07/2009  . LUNG NODULE 09/01/2006    Excision, Bx Benighn  . Osteopenia 2004    Dexa 2004 showed Osteopenia, DEXA 03/2007 normal  . Seizures   . Intertrochanteric fracture of right hip 07/13/2012  . 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 GERD (gastroesophageal reflux disease) 10/05/2011    Medications:  Prescriptions prior to admission  Medication Sig Dispense Refill Last Dose  . atorvastatin (LIPITOR) 40 MG tablet Take 1 tablet daily at bedtime. 30 tablet 2 Past Week at Unknown time  . calcium-vitamin D (OSCAL WITH D) 500-200 MG-UNIT per tablet Take 1 tablet by mouth daily.   01/23/2014 at Unknown time  .  docusate sodium 100 MG CAPS Take 100 mg by mouth 2 (two) times daily. 10 capsule 0 unknown  . escitalopram (LEXAPRO) 10 MG tablet Take 1 tablet (10 mg total) by mouth daily. 90 tablet 2 01/23/2014 at Unknown time  . insulin NPH-regular Human (NOVOLIN 70/30 RELION) (70-30) 100 UNIT/ML injection 38 units with breakfast, and 18 units with the evening meal   01/22/2014 at Unknown time  . metoprolol tartrate (LOPRESSOR) 25 MG tablet TAKE ONE-HALF TABLET BY MOUTH TWICE DAILY 90 tablet 1 01/23/2014 at 0700  . phenytoin (DILANTIN) 100 MG ER capsule Take 3 capsules (300 mg total) by mouth daily. 120 capsule 3 01/22/2014 at Unknown time  . warfarin (COUMADIN) 2.5 MG tablet Take as directed by anticoagulation clinic 40 tablet 3 01/23/2014 at Unknown time    Assessment: On warfarin PTA for AFib, INR 1.79, CBC stable. Pt missed warfarin dose on 11/23.  PTA dose: 2.5 mg po daily  Goal of Therapy:  INR 2-3 Monitor platelets by anticoagulation protocol: Yes   Plan:  -Warfarin 4 mg po x1 -Daily INR  Harvel Quale 01/24/2014,3:16 PM

## 2014-01-29 ENCOUNTER — Ambulatory Visit (INDEPENDENT_AMBULATORY_CARE_PROVIDER_SITE_OTHER): Payer: Medicare Other | Admitting: Medical

## 2014-01-29 ENCOUNTER — Encounter: Payer: Self-pay | Admitting: Medical

## 2014-01-29 VITALS — BP 130/73 | HR 62 | Temp 98.0°F | Ht 67.0 in | Wt 212.6 lb

## 2014-01-29 DIAGNOSIS — Z8669 Personal history of other diseases of the nervous system and sense organs: Secondary | ICD-10-CM | POA: Diagnosis not present

## 2014-01-29 DIAGNOSIS — Z5181 Encounter for therapeutic drug level monitoring: Secondary | ICD-10-CM

## 2014-01-29 DIAGNOSIS — L03113 Cellulitis of right upper limb: Secondary | ICD-10-CM | POA: Diagnosis not present

## 2014-01-29 DIAGNOSIS — L0390 Cellulitis, unspecified: Secondary | ICD-10-CM | POA: Insufficient documentation

## 2014-01-29 DIAGNOSIS — Z87898 Personal history of other specified conditions: Secondary | ICD-10-CM

## 2014-01-29 LAB — CBC WITH DIFFERENTIAL/PLATELET
BASOS ABS: 0.1 10*3/uL (ref 0.0–0.1)
BASOS PCT: 0.8 % (ref 0.0–3.0)
Eosinophils Absolute: 0.1 10*3/uL (ref 0.0–0.7)
Eosinophils Relative: 1 % (ref 0.0–5.0)
HEMATOCRIT: 37.4 % (ref 36.0–46.0)
HEMOGLOBIN: 12.2 g/dL (ref 12.0–15.0)
LYMPHS ABS: 2.7 10*3/uL (ref 0.7–4.0)
Lymphocytes Relative: 27.1 % (ref 12.0–46.0)
MCHC: 32.5 g/dL (ref 30.0–36.0)
MCV: 93.2 fl (ref 78.0–100.0)
Monocytes Absolute: 0.8 10*3/uL (ref 0.1–1.0)
Monocytes Relative: 7.9 % (ref 3.0–12.0)
Neutro Abs: 6.2 10*3/uL (ref 1.4–7.7)
Neutrophils Relative %: 63.2 % (ref 43.0–77.0)
Platelets: 411 10*3/uL — ABNORMAL HIGH (ref 150.0–400.0)
RBC: 4.01 Mil/uL (ref 3.87–5.11)
RDW: 13.1 % (ref 11.5–15.5)
WBC: 9.8 10*3/uL (ref 4.0–10.5)

## 2014-01-29 LAB — POCT INR: INR: 1.5

## 2014-01-29 NOTE — Progress Notes (Signed)
Subjective:    Patient ID: [name] [name], female    DOB: 12/06/1940, 73 y.o.   MRN: [id number]  HPI   Pt in for rt hand swelling on her rt hand 01-22-2014. She had severe swelling. They gave iv clindamycin and wrote a rx of oral. She has not filled oral rx yet. Pt was admitted and further treated. Pt had ultrasound of showed some edema that suggested cellultis.  Pt is on coumadin. On coumadin due to atrial fibrillation history. States has been on this for more than 25 years. Pt cardiologist Dr. Lovena Le and coumadin clinic makes adjustment on her coumadin.  Pt recently had adjustment of her Dilantin since that was effecting her coumadin level. They decreased dose based on conversation with neurologist. Pt states almost 4 years since last seizures.  Pt appointment with coumadin clinic on the 8th.  Past Medical History  Diagnosis Date  . Blindness of left eye   . Eye muscle weakness     Right eye weakness after cataract surgery  . Common migraine     ?hx of  . DIABETES MELLITUS, TYPE II 03/27/2006    dr [name]  . HYPERLIPIDEMIA 03/27/2006  . 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 HYPERTENSION 03/27/2006  . Atrial fibrillation     SVT dx 2007, cath 2007 mild CAD, then had an cardioversion, ablation; still on coumadin , has occ palpitation, EKG 03-2010 NSR  . Headache(784.0) 05/07/2009  . LUNG NODULE 09/01/2006    Excision, Bx Benighn  . Osteopenia 2004    Dexa 2004 showed Osteopenia, DEXA 03/2007 normal  . Seizures   . Intertrochanteric fracture of right hip 07/13/2012  . 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 GERD (gastroesophageal reflux disease) 10/05/2011    History   Social History  . Marital Status: Married    Spouse Name: Jenny Reichmann    Number of Children: 1  . Years of Education: College   Occupational History  . retired     Social History Main Topics  . Smoking status: Former Smoker    Types: Cigarettes    Quit date: 03/02/1978  . Smokeless tobacco: Never Used  . Alcohol  Use: No  . Drug Use: No  . Sexual Activity: No   Other Topics Concern  . Not on file   Social History Narrative   Patient lives at home spouse.   Moved from [place] 2004        Past Surgical History  Procedure Laterality Date  . Shoulder surgery    . Femur im nail Right 07/15/2012    Procedure: INTRAMEDULLARY (IM) NAIL HIP;  Surgeon: Johnny Bridge, MD;  Location: [place];  Service: Orthopedics;  Laterality: Right;    Family History  Problem Relation Age of Onset  . Diabetes Father   . Heart attack Father 28  . Diabetes Sister   . Cancer Neg Hx     no hx of colon or breast cancer    Allergies  Allergen Reactions  . Procaine Hcl Anaphylaxis    Current Outpatient Prescriptions on File Prior to Visit  Medication Sig Dispense Refill  . atorvastatin (LIPITOR) 40 MG tablet Take 1 tablet daily at bedtime. 30 tablet 2  . calcium-vitamin D (OSCAL WITH D) 500-200 MG-UNIT per tablet Take 1 tablet by mouth daily.    . clindamycin (CLEOCIN) 300 MG capsule Take 2 capsules (600 mg total) by mouth 3 (three) times daily. 42 capsule 0  . docusate sodium 100 MG CAPS Take 100 mg by  mouth 2 (two) times daily. 10 capsule 0  . escitalopram (LEXAPRO) 10 MG tablet Take 1 tablet (10 mg total) by mouth daily. 90 tablet 2  . insulin NPH-regular Human (NOVOLIN 70/30 RELION) (70-30) 100 UNIT/ML injection 38 units with breakfast, and 18 units with the evening meal    . metoprolol tartrate (LOPRESSOR) 25 MG tablet TAKE ONE-HALF TABLET BY MOUTH TWICE DAILY 90 tablet 1  . phenytoin (DILANTIN) 100 MG ER capsule Take 3 capsules (300 mg total) by mouth daily. 120 capsule 3  . warfarin (COUMADIN) 2.5 MG tablet Take as directed by anticoagulation clinic 40 tablet 3   No current facility-administered medications on file prior to visit.    BP 130/73 mmHg  Pulse 62  Temp(Src) 98 F (36.7 C) (Oral)  Ht 5\' 7"  (1.702 m)  Wt 212 lb 9.6 oz (96.435 kg)  BMI 33.29 kg/m2  SpO2 96%      Review of Systems    Constitutional: Negative for fever, chills and fatigue.  HENT: Negative for congestion, ear discharge, ear pain, nosebleeds, postnasal drip, rhinorrhea, sinus pressure, sore throat and trouble swallowing.   Respiratory: Negative for cough, chest tightness, shortness of breath and wheezing.   Cardiovascular: Negative for chest pain and palpitations.  Gastrointestinal: Negative for vomiting.  Genitourinary: Negative for dysuria and flank pain.  Musculoskeletal: Negative for back pain.  Skin:       Much less red rt hand with much less swelling.  Neurological: Negative for dizziness, tremors, seizures, syncope, weakness, light-headedness, numbness and headaches.  Hematological: Negative for adenopathy. Does not bruise/bleed easily.  Psychiatric/Behavioral: Negative for suicidal ideas.       Objective:   Physical Exam   General- No acute distress Neck- Full range of motion, no nuccal rigidity. Heart- RRR Lungs- CTA Rt upper ext- No upper arm swelling. No bruit heard in brachial area.  Rt wrist/foream- distal radius covering area about 6 cm pinkish in color with some cracked skin length of shallow crack 2 cm and some small scattered scabbing. But no warmth. The previous severe swelling of hand all the way up her mid forearm is now resolved.      Assessment & Plan:

## 2014-01-29 NOTE — Assessment & Plan Note (Signed)
Much improved now. I was able to see pictures of initial ED presentation and it looks signifcantly improved. I would start the oral clindamycin since it does not look completely healed.  We will get INR today and dilantin level. Then we will notify the coumadin clinic so they can coordinate with neurologist regarding dosing of both meds.    I did send e-mail to 4 nurses  at coumadin clinic to make sure one will see and address how they will dose her coumadin.

## 2014-01-29 NOTE — Patient Instructions (Signed)
Your cellulitis looks much improved compared to pictures reviewed from the ED visit. I would start the oral clindamycin since it does not look completely healed.  We will get INR today and dilantin level. Then we will notify the coumadin clinic so they can coordinate with neurologist regarding dosing of both meds.  Also will inform them she is on clindamycin for cellulitis.  Follow up in 7 days or as needed.

## 2014-01-29 NOTE — Progress Notes (Signed)
Pre visit review using our clinic review tool, if applicable. No additional management support is needed unless otherwise documented below in the visit note. 

## 2014-01-30 LAB — PHENYTOIN LEVEL, TOTAL: Phenytoin Lvl: 3 ug/mL — ABNORMAL LOW (ref 10.0–20.0)

## 2014-02-01 ENCOUNTER — Encounter: Payer: Self-pay | Admitting: Internal Medicine

## 2014-02-01 ENCOUNTER — Ambulatory Visit (INDEPENDENT_AMBULATORY_CARE_PROVIDER_SITE_OTHER): Payer: Medicare Other | Admitting: Pharmacist

## 2014-02-01 DIAGNOSIS — Z5181 Encounter for therapeutic drug level monitoring: Secondary | ICD-10-CM

## 2014-02-01 DIAGNOSIS — I4891 Unspecified atrial fibrillation: Secondary | ICD-10-CM

## 2014-02-05 ENCOUNTER — Encounter: Payer: Self-pay | Admitting: Medical

## 2014-02-05 ENCOUNTER — Ambulatory Visit (INDEPENDENT_AMBULATORY_CARE_PROVIDER_SITE_OTHER): Payer: Medicare Other | Admitting: Medical

## 2014-02-05 ENCOUNTER — Other Ambulatory Visit: Payer: Self-pay | Admitting: Internal Medicine

## 2014-02-05 VITALS — BP 130/71 | HR 60 | Temp 97.7°F | Ht 67.0 in | Wt 212.2 lb

## 2014-02-05 DIAGNOSIS — L03113 Cellulitis of right upper limb: Secondary | ICD-10-CM

## 2014-02-05 MED ORDER — MUPIROCIN 2 % EX OINT: TOPICAL_OINTMENT | CUTANEOUS | Status: DC

## 2014-02-05 NOTE — Progress Notes (Signed)
Subjective:    Patient ID: [name] [name], female    DOB: February 13, 1941, 73 y.o.   MRN: [id number]  HPI   Pt in stating that the clindamycin that I prescribed bothered her stomach. She stopped the clindamycin due to fact that it made her vomit. She stopped med 2 days ago and stopped vomiting. She took medication for about 5 days. No fevers, no chills and not sweats. No hand pain. Hand feels just about back to normal.  Pt was scheduled to go to coumadin clinic on the 10th.   Past Medical History  Diagnosis Date  . Blindness of left eye   . Eye muscle weakness     Right eye weakness after cataract surgery  . Common migraine     ?hx of  . DIABETES MELLITUS, TYPE II 03/27/2006    dr [name]  . HYPERLIPIDEMIA 03/27/2006  . 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 HYPERTENSION 03/27/2006  . Atrial fibrillation     SVT dx 2007, cath 2007 mild CAD, then had an cardioversion, ablation; still on coumadin , has occ palpitation, EKG 03-2010 NSR  . Headache(784.0) 05/07/2009  . LUNG NODULE 09/01/2006    Excision, Bx Benighn  . Osteopenia 2004    Dexa 2004 showed Osteopenia, DEXA 03/2007 normal  . Seizures   . Intertrochanteric fracture of right hip 07/13/2012  . 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 GERD (gastroesophageal reflux disease) 10/05/2011    History   Social History  . Marital Status: Married    Spouse Name: Jenny Reichmann    Number of Children: 1  . Years of Education: College   Occupational History  . retired     Social History Main Topics  . Smoking status: Former Smoker    Types: Cigarettes    Quit date: 03/02/1978  . Smokeless tobacco: Never Used  . Alcohol Use: No  . Drug Use: No  . Sexual Activity: No   Other Topics Concern  . Not on file   Social History Narrative   Patient lives at home spouse.   Moved from [place] 2004        Past Surgical History  Procedure Laterality Date  . Shoulder surgery    . Femur im nail Right 07/15/2012    Procedure: INTRAMEDULLARY (IM) NAIL HIP;   Surgeon: Johnny Bridge, MD;  Location: [facility];  Service: Orthopedics;  Laterality: Right;    Family History  Problem Relation Age of Onset  . Diabetes Father   . Heart attack Father 55  . Diabetes Sister   . Cancer Neg Hx     no hx of colon or breast cancer    Allergies  Allergen Reactions  . Procaine Hcl Anaphylaxis    Current Outpatient Prescriptions on File Prior to Visit  Medication Sig Dispense Refill  . atorvastatin (LIPITOR) 40 MG tablet Take 1 tablet daily at bedtime. 30 tablet 2  . calcium-vitamin D (OSCAL WITH D) 500-200 MG-UNIT per tablet Take 1 tablet by mouth daily.    [name] docusate sodium 100 MG CAPS Take 100 mg by mouth 2 (two) times daily. 10 capsule 0  . escitalopram (LEXAPRO) 10 MG tablet Take 1 tablet (10 mg total) by mouth daily. 90 tablet 2  . insulin NPH-regular Human (NOVOLIN 70/30 RELION) (70-30) 100 UNIT/ML injection 38 units with breakfast, and 18 units with the evening meal    . metoprolol tartrate (LOPRESSOR) 25 MG tablet TAKE ONE-HALF TABLET BY MOUTH TWICE DAILY 90 tablet 1  . phenytoin (DILANTIN)  100 MG ER capsule Take 3 capsules (300 mg total) by mouth daily. 120 capsule 3  . clindamycin (CLEOCIN) 300 MG capsule Take 2 capsules (600 mg total) by mouth 3 (three) times daily. (Patient not taking: Reported on 02/05/2014) 42 capsule 0   No current facility-administered medications on file prior to visit.    BP 130/71 mmHg  Pulse 60  Temp(Src) 97.7 F (36.5 C) (Oral)  Ht 5\' 7"  (1.702 m)  Wt 212 lb 3.2 oz (96.253 kg)  BMI 33.23 kg/m2  SpO2 96%         Review of Systems  Constitutional: Negative for fever, chills and fatigue.  Respiratory: Negative for cough, choking and wheezing.   Cardiovascular: Negative for chest pain and palpitations.  Gastrointestinal: Negative for nausea, abdominal pain and diarrhea.       None since she stopped her clindamycin  Endocrine: Negative for polydipsia, polyphagia and polyuria.  Musculoskeletal: Negative for  myalgias, back pain and neck stiffness.  Skin: Positive for rash.  Hematological: Negative for adenopathy. Does not bruise/bleed easily.       Objective:   Physical Exam   General- No acute distress. Lungs- CTA Heart- RRR Skin- rt wrist/forearm Area looks lot better only small scattered scabs number # 5 from distal rt radius region up to distal 3rd aspect of forearm. No redness, no warmth and not tender. Faint pink appearance around scabs.        Assessment & Plan:

## 2014-02-05 NOTE — Patient Instructions (Addendum)
Cellulitis region looks resolved now. I don't think further oral antibiotics needs particularly with recent side effects but would prescribe topical mupirocin to apply on scab areas. If any recurrent redness warmth or tenderness notify us.  See coumadin clinic on this Thursday. Follow up her as needed

## 2014-02-05 NOTE — Progress Notes (Signed)
Pre visit review using our clinic review tool, if applicable. No additional management support is needed unless otherwise documented below in the visit note. 

## 2014-02-05 NOTE — Assessment & Plan Note (Signed)
Cellulitis region looks resolved now. I don't think further oral antibiotics needs particularly with recent side effects but would prescribe topical mupirocin to apply on scab areas. If any recurrent redness warmth or tenderness notify us.

## 2014-02-08 ENCOUNTER — Ambulatory Visit (INDEPENDENT_AMBULATORY_CARE_PROVIDER_SITE_OTHER): Payer: Medicare Other | Admitting: *Deleted

## 2014-02-08 DIAGNOSIS — Z5181 Encounter for therapeutic drug level monitoring: Secondary | ICD-10-CM

## 2014-02-08 DIAGNOSIS — I4891 Unspecified atrial fibrillation: Secondary | ICD-10-CM

## 2014-02-08 DIAGNOSIS — Z7901 Long term (current) use of anticoagulants: Secondary | ICD-10-CM

## 2014-02-08 DIAGNOSIS — I4892 Unspecified atrial flutter: Secondary | ICD-10-CM

## 2014-02-08 LAB — POCT INR: INR: 1.9

## 2014-02-20 ENCOUNTER — Ambulatory Visit (INDEPENDENT_AMBULATORY_CARE_PROVIDER_SITE_OTHER): Payer: Medicare Other | Admitting: Pharmacist

## 2014-02-20 DIAGNOSIS — Z5181 Encounter for therapeutic drug level monitoring: Secondary | ICD-10-CM

## 2014-02-20 DIAGNOSIS — Z7901 Long term (current) use of anticoagulants: Secondary | ICD-10-CM

## 2014-02-20 DIAGNOSIS — I4892 Unspecified atrial flutter: Secondary | ICD-10-CM

## 2014-02-20 DIAGNOSIS — I4891 Unspecified atrial fibrillation: Secondary | ICD-10-CM

## 2014-02-20 LAB — POCT INR: INR: 2.5

## 2014-03-03 ENCOUNTER — Other Ambulatory Visit: Payer: Self-pay | Admitting: Endocrinology

## 2014-03-13 ENCOUNTER — Ambulatory Visit (INDEPENDENT_AMBULATORY_CARE_PROVIDER_SITE_OTHER): Payer: Medicare Other | Admitting: *Deleted

## 2014-03-13 DIAGNOSIS — Z7901 Long term (current) use of anticoagulants: Secondary | ICD-10-CM

## 2014-03-13 DIAGNOSIS — I4892 Unspecified atrial flutter: Secondary | ICD-10-CM

## 2014-03-13 DIAGNOSIS — I4891 Unspecified atrial fibrillation: Secondary | ICD-10-CM

## 2014-03-13 DIAGNOSIS — Z5181 Encounter for therapeutic drug level monitoring: Secondary | ICD-10-CM

## 2014-03-13 LAB — POCT INR: INR: 1.6

## 2014-03-19 ENCOUNTER — Telehealth: Payer: Self-pay | Admitting: Internal Medicine

## 2014-03-19 ENCOUNTER — Other Ambulatory Visit: Payer: Self-pay

## 2014-03-19 MED ORDER — PHENYTOIN SODIUM EXTENDED 100 MG PO CAPS: 300.0000 mg | ORAL_CAPSULE | Freq: Every day | ORAL | Status: DC

## 2014-03-19 NOTE — Telephone Encounter (Signed)
Caller name: [place] Relation to pt: self  Call back number: [phone] Pharmacy: [facility] [address]  Reason for call:   Requesting refill of phenytoin (DILANTIN)

## 2014-03-19 NOTE — Telephone Encounter (Signed)
Refilled to [facility] as requested.

## 2014-03-20 ENCOUNTER — Telehealth: Payer: Self-pay

## 2014-03-20 MED ORDER — DILTIAZEM HCL ER 240 MG PO CP24: 240.0000 mg | ORAL_CAPSULE | Freq: Every day | ORAL | Status: DC

## 2014-03-20 NOTE — Telephone Encounter (Signed)
Diltiazem d/c in "error" by Dr. Leonie Man on 11/14/2013 at [facility].

## 2014-03-20 NOTE — Telephone Encounter (Signed)
Agree, please clarify with the patient, has she  been taking it?

## 2014-03-20 NOTE — Telephone Encounter (Signed)
Received refill request for Pt from [facility] for Diltiazem ER 240MG : Take 1 capsule once daily; do not see on active medication list. Please advise if okay to refill?

## 2014-03-20 NOTE — Telephone Encounter (Signed)
Diltiazem sent to [facility].

## 2014-03-20 NOTE — Telephone Encounter (Signed)
Ok to RF x 6 months

## 2014-04-03 ENCOUNTER — Ambulatory Visit (INDEPENDENT_AMBULATORY_CARE_PROVIDER_SITE_OTHER): Payer: Medicare Other | Admitting: *Deleted

## 2014-04-03 DIAGNOSIS — Z5181 Encounter for therapeutic drug level monitoring: Secondary | ICD-10-CM

## 2014-04-03 DIAGNOSIS — Z7901 Long term (current) use of anticoagulants: Secondary | ICD-10-CM

## 2014-04-03 DIAGNOSIS — I4891 Unspecified atrial fibrillation: Secondary | ICD-10-CM

## 2014-04-03 DIAGNOSIS — I4892 Unspecified atrial flutter: Secondary | ICD-10-CM

## 2014-04-03 LAB — POCT INR: INR: 2.3

## 2014-04-09 ENCOUNTER — Ambulatory Visit (INDEPENDENT_AMBULATORY_CARE_PROVIDER_SITE_OTHER): Payer: Medicare Other | Admitting: Internal Medicine

## 2014-04-09 ENCOUNTER — Encounter: Payer: Self-pay | Admitting: Internal Medicine

## 2014-04-09 VITALS — BP 149/73 | HR 56 | Temp 98.2°F | Ht 67.0 in | Wt 212.1 lb

## 2014-04-09 DIAGNOSIS — F419 Anxiety disorder, unspecified: Secondary | ICD-10-CM

## 2014-04-09 DIAGNOSIS — I10 Essential (primary) hypertension: Secondary | ICD-10-CM

## 2014-04-09 DIAGNOSIS — E118 Type 2 diabetes mellitus with unspecified complications: Secondary | ICD-10-CM

## 2014-04-09 NOTE — Assessment & Plan Note (Signed)
Much improved with Lexapro, no change, reassess in April

## 2014-04-09 NOTE — Assessment & Plan Note (Signed)
BP slightly elevated today, recommend to check in the ambulatory setting

## 2014-04-09 NOTE — Progress Notes (Signed)
Pre visit review using our clinic review tool, if applicable. No additional management support is needed unless otherwise documented below in the visit note. 

## 2014-04-09 NOTE — Progress Notes (Signed)
Subjective:    Patient ID: [name] [name], female    DOB: [birth year], 74 y.o.   MRN: [id number]  DOS:  04/09/2014 Type of visit - description : f/u Interval history:  Anxiety, on Lexapro, symptoms under excellent control, patient thinks mostly because her son is not longer living with them. Diabetes, per endocrinology, occasionally low blood sugars. Hypertension, BP slightly elevated today, no recent ambulatory BPs.  Review of Systems Denies chest pain or difficulty breathing. No syncope. No nausea, vomiting, diarrhea  Past Medical History  Diagnosis Date  . Blindness of left eye   . Eye muscle weakness     Right eye weakness after cataract surgery  . Common migraine     ?hx of  . DIABETES MELLITUS, TYPE II 03/27/2006    dr [name]  . HYPERLIPIDEMIA 03/27/2006  . 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 HYPERTENSION 03/27/2006  . Atrial fibrillation     SVT dx 2007, cath 2007 mild CAD, then had an cardioversion, ablation; still on coumadin , has occ palpitation, EKG 03-2010 NSR  . Headache(784.0) 05/07/2009  . LUNG NODULE 09/01/2006    Excision, Bx Benighn  . Osteopenia 2004    Dexa 2004 showed Osteopenia, DEXA 03/2007 normal  . Seizures   . Intertrochanteric fracture of right hip 07/13/2012  . 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 GERD (gastroesophageal reflux disease) 10/05/2011    Past Surgical History  Procedure Laterality Date  . Shoulder surgery    . Femur im nail Right 07/15/2012    Procedure: INTRAMEDULLARY (IM) NAIL HIP;  Surgeon: Johnny Bridge, MD;  Location: [facility];  Service: Orthopedics;  Laterality: Right;    History   Social History  . Marital Status: Married    Spouse Name: Jenny Reichmann    Number of Children: 1  . Years of Education: College   Occupational History  . retired     Social History Main Topics  . Smoking status: Former Smoker    Types: Cigarettes    Quit date: 03/02/1978  . Smokeless tobacco: Never Used  . Alcohol Use: No  . Drug Use: No  . Sexual  Activity: No   Other Topics Concern  . Not on file   Social History Narrative   Patient lives at home spouse.   Moved from [place] 2004            Medication List       This list is accurate as of: 04/09/14  7:07 PM.  Always use your most recent med list.               atorvastatin 40 MG tablet  Commonly known as:  LIPITOR  Take 1 tablet daily at bedtime.     calcium-vitamin D 500-200 MG-UNIT per tablet  Commonly known as:  OSCAL WITH D  Take 1 tablet by mouth daily.     diltiazem 240 MG 24 hr capsule  Commonly known as:  DILACOR XR  Take 1 capsule (240 mg total) by mouth daily.     DSS 100 MG Caps  Take 100 mg by mouth 2 (two) times daily.     escitalopram 10 MG tablet  Commonly known as:  LEXAPRO  Take 1 tablet (10 mg total) by mouth daily.     metoprolol tartrate 25 MG tablet  Commonly known as:  LOPRESSOR  TAKE ONE-HALF TABLET BY MOUTH TWICE DAILY     mupirocin ointment 2 %  Commonly known as:  BACTROBAN  Apply to area twice  a day     NOVOLIN 70/30 RELION (70-30) 100 UNIT/ML injection  Generic drug:  insulin NPH-regular Human  30 units with breakfast, and 15 units with the evening meal     phenytoin 100 MG ER capsule  Commonly known as:  DILANTIN  Take 3 capsules (300 mg total) by mouth daily.     warfarin 2.5 MG tablet  Commonly known as:  COUMADIN  USE AS DIRECTED BY ANTICOAGULATION CLINIC           Objective:   Physical Exam  Constitutional: She is oriented to person, place, and time. She appears well-developed. No distress.  HENT:  Head: Normocephalic and atraumatic.  Cardiovascular:  RRR, no murmur, rub or gallop  Pulmonary/Chest: Effort normal. No respiratory distress.  CTA B  Musculoskeletal: She exhibits no edema or tenderness.  Neurological: She is alert and oriented to person, place, and time. No cranial nerve deficit. She exhibits normal muscle tone. Coordination normal.  Speech normal, gait unassisted and normal for age, motor  strength appropriate for age   Skin: Skin is warm and dry. No pallor.  No jaundice  Psychiatric: She has a normal mood and affect. Her behavior is normal. Judgment and thought content normal.  Mode definitely improved compared to last visit  Vitals reviewed.        Assessment & Plan:   Problem List Items Addressed This Visit      Cardiovascular and Mediastinum   Essential hypertension - Primary    BP slightly elevated today, recommend to check in the ambulatory setting        Endocrine   Diabetes mellitus    Symptoms of hypoglycemia, will discuss with endocrinology in 2 days. Encourage not to skip meals and carry glucose with her. Symptoms of hypoglycemia discussed        Other   Anxiety    Much improved with Lexapro, no change, reassess in April

## 2014-04-09 NOTE — Assessment & Plan Note (Signed)
Symptoms of hypoglycemia, will discuss with endocrinology in 2 days. Encourage not to skip meals and carry glucose with her. Symptoms of hypoglycemia discussed

## 2014-04-09 NOTE — Patient Instructions (Signed)
Please come back to the office by 06-2014  for a physical exam. Come back fasting       Hypoglycemia Hypoglycemia occurs when the glucose in your blood is too low. Glucose is a type of sugar that is your body's main energy source. Hormones, such as insulin and glucagon, control the level of glucose in the blood. Insulin lowers blood glucose and glucagon increases blood glucose. Having too much insulin in your blood stream, or not eating enough food containing sugar, can result in hypoglycemia. Hypoglycemia can happen to people with or without diabetes. It can develop quickly and can be a medical emergency.  CAUSES   Missing or delaying meals.  Not eating enough carbohydrates at meals.  Taking too much diabetes medicine.  Not timing your oral diabetes medicine or insulin doses with meals, snacks, and exercise.  Nausea and vomiting.  Certain medicines.  Severe illnesses, such as hepatitis, kidney disorders, and certain eating disorders.  Increased activity or exercise without eating something extra or adjusting medicines.  Drinking too much alcohol.  A nerve disorder that affects body functions like your heart rate, blood pressure, and digestion (autonomic neuropathy).  A condition where the stomach muscles do not function properly (gastroparesis). Therefore, medicines and food may not absorb properly.  Rarely, a tumor of the pancreas can produce too much insulin. SYMPTOMS   Hunger.  Sweating (diaphoresis).  Change in body temperature.  Shakiness.  Headache.  Anxiety.  Lightheadedness.  Irritability.  Difficulty concentrating.  Dry mouth.  Tingling or numbness in the hands or feet.  Restless sleep or sleep disturbances.  Altered speech and coordination.  Change in mental status.  Seizures or prolonged convulsions.  Combativeness.  Drowsiness (lethargic).  Weakness.  Increased heart rate or palpitations.  Confusion.  Pale, gray skin  color.  Blurred or double vision.  Fainting. DIAGNOSIS  A physical exam and medical history will be performed. Your caregiver may make a diagnosis based on your symptoms. Blood tests and other lab tests may be performed to confirm a diagnosis. Once the diagnosis is made, your caregiver will see if your signs and symptoms go away once your blood glucose is raised.  TREATMENT  Usually, you can easily treat your hypoglycemia when you notice symptoms.  Check your blood glucose. If it is less than 70 mg/dl, take one of the following:   3-4 glucose tablets.    cup juice.    cup regular soda.   1 cup skim milk.   -1 tube of glucose gel.   5-6 hard candies.   Avoid high-fat drinks or food that may delay a rise in blood glucose levels.  Do not take more than the recommended amount of sugary foods, drinks, gel, or tablets. Doing so will cause your blood glucose to go too high.   Wait 10-15 minutes and recheck your blood glucose. If it is still less than 70 mg/dl or below your target range, repeat treatment.   Eat a snack if it is more than 1 hour until your next meal.  There may be a time when your blood glucose may go so low that you are unable to treat yourself at home when you start to notice symptoms. You may need someone to help you. You may even faint or be unable to swallow. If you cannot treat yourself, someone will need to bring you to the hospital.  [place]  If you have diabetes, follow your diabetes management plan by:  Taking your medicines as  directed.  Following your exercise plan.  Following your meal plan. Do not skip meals. Eat on time.  Testing your blood glucose regularly. Check your blood glucose before and after exercise. If you exercise longer or different than usual, be sure to check blood glucose more frequently.  Wearing your medical alert jewelry that says you have diabetes.  Identify the cause of your hypoglycemia. Then,  develop ways to prevent the recurrence of hypoglycemia.  Do not take a hot bath or shower right after an insulin shot.  Always carry treatment with you. Glucose tablets are the easiest to carry.  If you are going to drink alcohol, drink it only with meals.  Tell friends or family members ways to keep you safe during a seizure. This may include removing hard or sharp objects from the area or turning you on your side.  Maintain a healthy weight. SEEK MEDICAL CARE IF:   You are having problems keeping your blood glucose in your target range.  You are having frequent episodes of hypoglycemia.  You feel you might be having side effects from your medicines.  You are not sure why your blood glucose is dropping so low.  You notice a change in vision or a new problem with your vision. SEEK IMMEDIATE MEDICAL CARE IF:   Confusion develops.  A change in mental status occurs.  The inability to swallow develops.  Fainting occurs. Document Released: 02/16/2005 Document Revised: 02/21/2013 Document Reviewed: 06/15/2011 [facility] Patient Information 2015 [place], [place]. This information is not intended to replace advice given to you by your health care provider. Make sure you discuss any questions you have with your health care provider.

## 2014-04-11 ENCOUNTER — Ambulatory Visit: Payer: Medicare Other | Admitting: Endocrinology

## 2014-04-12 ENCOUNTER — Ambulatory Visit: Payer: Medicare Other | Admitting: Endocrinology

## 2014-04-14 ENCOUNTER — Other Ambulatory Visit: Payer: Self-pay | Admitting: Endocrinology

## 2014-04-18 ENCOUNTER — Encounter: Payer: Self-pay | Admitting: Endocrinology

## 2014-04-18 ENCOUNTER — Ambulatory Visit (INDEPENDENT_AMBULATORY_CARE_PROVIDER_SITE_OTHER): Payer: Medicare Other | Admitting: Endocrinology

## 2014-04-18 VITALS — BP 132/54 | HR 66 | Temp 97.9°F | Ht 67.0 in | Wt 206.0 lb

## 2014-04-18 DIAGNOSIS — E1042 Type 1 diabetes mellitus with diabetic polyneuropathy: Secondary | ICD-10-CM

## 2014-04-18 LAB — HEMOGLOBIN A1C: Hgb A1c MFr Bld: 7.1 % — ABNORMAL HIGH (ref 4.6–6.5)

## 2014-04-18 NOTE — Patient Instructions (Addendum)
blood tests are being requested for you today.  We'll let you know about the results. Please come back for a follow-up appointment in 3 months.   check your blood sugar twice a day.  vary the time of day when you check, between before the 3 meals, and at bedtime.  also check if you have symptoms of your blood sugar being too high or too low.  please keep a record of the readings and bring it to your next appointment here.  please call us sooner if your blood sugar goes below 70, or if you have a lot of readings over 200.

## 2014-04-18 NOTE — Progress Notes (Signed)
Subjective:    Patient ID: [name] [name], female    DOB: [birth year], 74 y.o.   MRN: [id number]  HPI Pt returns for f/u of diabetes mellitus: DM type: Insulin-requiring type 2 Dx'ed: 0000000 Complications: polyneuropathy and retinopathy. Therapy: insulin since soon after dx GDM: never DKA: never Severe hypoglycemia: last episode was mid-2015. Pancreatitis: never.   Other: she is on BID premixed insulin, due to noncompliance with multiple daily injections.  She takes human insulin, due to cost.   Interval history: since last ov, she seldom has hypoglycemia, and these episodes are mild.  It happens with exertion.  no cbg record, but states cbg's are in general higher as the day goes on.  She takes 15 units qam, and 10-15 units qpm.   Past Medical History  Diagnosis Date  . Blindness of left eye   . Eye muscle weakness     Right eye weakness after cataract surgery  . Common migraine     ?hx of  . DIABETES MELLITUS, TYPE II 03/27/2006    dr [name]  . HYPERLIPIDEMIA 03/27/2006  . 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 HYPERTENSION 03/27/2006  . Atrial fibrillation     SVT dx 2007, cath 2007 mild CAD, then had an cardioversion, ablation; still on coumadin , has occ palpitation, EKG 03-2010 NSR  . Headache(784.0) 05/07/2009  . LUNG NODULE 09/01/2006    Excision, Bx Benighn  . Osteopenia 2004    Dexa 2004 showed Osteopenia, DEXA 03/2007 normal  . Seizures   . Intertrochanteric fracture of right hip 07/13/2012  . 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 GERD (gastroesophageal reflux disease) 10/05/2011    Past Surgical History  Procedure Laterality Date  . Shoulder surgery    . Femur im nail Right 07/15/2012    Procedure: INTRAMEDULLARY (IM) NAIL HIP;  Surgeon: Johnny Bridge, MD;  Location: [facility];  Service: Orthopedics;  Laterality: Right;    History   Social History  . Marital Status: Married    Spouse Name: Jenny Reichmann  . Number of Children: 1  . Years of Education: College   Occupational  History  . retired     Social History Main Topics  . Smoking status: Former Smoker    Types: Cigarettes    Quit date: 03/02/1978  . Smokeless tobacco: Never Used  . Alcohol Use: No  . Drug Use: No  . Sexual Activity: No   Other Topics Concern  . Not on file   Social History Narrative   Patient lives at home spouse.   Moved from [place] 2004        Current Outpatient Prescriptions on File Prior to Visit  Medication Sig Dispense Refill  . calcium-vitamin D (OSCAL WITH D) 500-200 MG-UNIT per tablet Take 1 tablet by mouth daily.    [name] diltiazem (DILACOR XR) 240 MG 24 hr capsule Take 1 capsule (240 mg total) by mouth daily. 30 capsule 5  . docusate sodium 100 MG CAPS Take 100 mg by mouth 2 (two) times daily. 10 capsule 0  . escitalopram (LEXAPRO) 10 MG tablet Take 1 tablet (10 mg total) by mouth daily. 90 tablet 2  . metoprolol tartrate (LOPRESSOR) 25 MG tablet TAKE ONE-HALF TABLET BY MOUTH TWICE DAILY 90 tablet 1  . mupirocin ointment (BACTROBAN) 2 % Apply to area twice a day 22 g 0  . NOVOLIN 70/30 RELION (70-30) 100 UNIT/ML injection INJECT 50 UNITS SUBCUTANEOUSLY ONCE DAILY WITH  BREAKFAST  (APPOINMENT  NEEDED  FOR  FUTURE  REFILL 20 mL 0  . phenytoin (DILANTIN) 100 MG ER capsule Take 3 capsules (300 mg total) by mouth daily. 120 capsule 5  . warfarin (COUMADIN) 2.5 MG tablet USE AS DIRECTED BY ANTICOAGULATION CLINIC 40 tablet 3  . atorvastatin (LIPITOR) 40 MG tablet Take 1 tablet daily at bedtime. (Patient not taking: Reported on 04/18/2014) 30 tablet 2   No current facility-administered medications on file prior to visit.    Allergies  Allergen Reactions  . Procaine Hcl Anaphylaxis    Family History  Problem Relation Age of Onset  . Diabetes Father   . Heart attack Father 91  . Diabetes Sister   . Cancer Neg Hx     no hx of colon or breast cancer    BP 132/54 mmHg  Pulse 66  Temp(Src) 97.9 F (36.6 C) (Oral)  Ht 5\' 7"  (1.702 m)  Wt 206 lb (93.441 kg)  BMI 32.26  kg/m2  SpO2 99%     Review of Systems She has lost a few lbs.  She denies recent LOC    Objective:   Physical Exam VITAL SIGNS:  See vs page GENERAL: no distress Pulses: dorsalis pedis intact bilat.   MSK: no deformity of the feet CV: no leg edema Skin:  no ulcer on the feet.  normal color and temp on the feet. Neuro: sensation is intact to touch on the feet Ext; There is bilateral onychomycosis of the toenails   Lab Results  Component Value Date   HGBA1C 7.1* 04/18/2014       Assessment & Plan:  DM: slightly overcontrolled, given this regimen, which does match insulin to her changing needs throughout the day.   Side-effect of rx: hypoglycemia.   Noncompliance with cbg recording and f/u ov's: I'll work around this as best I can   Patient is advised the following: Patient Instructions  blood tests are being requested for you today.  We'll let you know about the results. Please come back for a follow-up appointment in 3 months.   check your blood sugar twice a day.  vary the time of day when you check, between before the 3 meals, and at bedtime.  also check if you have symptoms of your blood sugar being too high or too low.  please keep a record of the readings and bring it to your next appointment here.  please call us sooner if your blood sugar goes below 70, or if you have a lot of readings over 200.     continue 15 units twice a day. However, if you are going to be active that day, take just 10 units that morning.

## 2014-04-24 ENCOUNTER — Other Ambulatory Visit: Payer: Self-pay

## 2014-04-24 ENCOUNTER — Ambulatory Visit (INDEPENDENT_AMBULATORY_CARE_PROVIDER_SITE_OTHER): Payer: Medicare Other | Admitting: *Deleted

## 2014-04-24 ENCOUNTER — Telehealth: Payer: Self-pay | Admitting: Internal Medicine

## 2014-04-24 DIAGNOSIS — I4891 Unspecified atrial fibrillation: Secondary | ICD-10-CM

## 2014-04-24 DIAGNOSIS — I4892 Unspecified atrial flutter: Secondary | ICD-10-CM | POA: Diagnosis not present

## 2014-04-24 DIAGNOSIS — Z7901 Long term (current) use of anticoagulants: Secondary | ICD-10-CM | POA: Diagnosis not present

## 2014-04-24 DIAGNOSIS — Z5181 Encounter for therapeutic drug level monitoring: Secondary | ICD-10-CM | POA: Diagnosis not present

## 2014-04-24 LAB — POCT INR: INR: 1.8

## 2014-04-24 MED ORDER — METOPROLOL TARTRATE 25 MG PO TABS: 12.5000 mg | ORAL_TABLET | Freq: Two times a day (BID) | ORAL | Status: DC

## 2014-04-24 NOTE — Telephone Encounter (Signed)
Caller name: [place], [place] Relation to pt: self  Call back number: [phone] Pharmacy: [facility], [place] [phone] (Phone) [phone] (Fax)       Reason for call:  Pt requesting a refill metoprolol tartrate (LOPRESSOR) 25 MG tablet (pt is completely out)

## 2014-04-24 NOTE — Telephone Encounter (Signed)
Metoprolol refilled to [facility] as requested. (#90 and 2RFs).

## 2014-05-02 ENCOUNTER — Ambulatory Visit: Payer: Medicare Other | Admitting: Neurology

## 2014-05-14 ENCOUNTER — Ambulatory Visit (INDEPENDENT_AMBULATORY_CARE_PROVIDER_SITE_OTHER): Payer: Medicare Other | Admitting: *Deleted

## 2014-05-14 DIAGNOSIS — I4892 Unspecified atrial flutter: Secondary | ICD-10-CM | POA: Diagnosis not present

## 2014-05-14 DIAGNOSIS — Z7901 Long term (current) use of anticoagulants: Secondary | ICD-10-CM | POA: Diagnosis not present

## 2014-05-14 DIAGNOSIS — I4891 Unspecified atrial fibrillation: Secondary | ICD-10-CM

## 2014-05-14 DIAGNOSIS — Z5181 Encounter for therapeutic drug level monitoring: Secondary | ICD-10-CM

## 2014-05-14 LAB — POCT INR: INR: 2.1

## 2014-05-15 ENCOUNTER — Ambulatory Visit: Payer: Self-pay | Admitting: Neurology

## 2014-05-16 ENCOUNTER — Other Ambulatory Visit: Payer: Self-pay | Admitting: Endocrinology

## 2014-05-16 ENCOUNTER — Other Ambulatory Visit: Payer: Self-pay | Admitting: Internal Medicine

## 2014-05-16 NOTE — Telephone Encounter (Signed)
Pt is requesting refill on Lorazepam.  Last OV: 04/09/2014 Last Fill: 11/14/2013 # 60 2RF UDS: 11/24/2013 Low risk  Please advise.

## 2014-05-16 NOTE — Telephone Encounter (Signed)
Print 60 and 5 RF

## 2014-05-16 NOTE — Telephone Encounter (Signed)
Rx printed, awaiting signature by Dr. [name].  

## 2014-05-16 NOTE — Telephone Encounter (Signed)
Faxed to [facility] pharmacy

## 2014-05-17 ENCOUNTER — Ambulatory Visit (INDEPENDENT_AMBULATORY_CARE_PROVIDER_SITE_OTHER): Payer: Medicare Other | Admitting: Neurology

## 2014-05-17 ENCOUNTER — Encounter: Payer: Self-pay | Admitting: Neurology

## 2014-05-17 VITALS — BP 153/66 | HR 56 | Ht 67.0 in | Wt 207.8 lb

## 2014-05-17 DIAGNOSIS — R413 Other amnesia: Secondary | ICD-10-CM | POA: Diagnosis not present

## 2014-05-17 DIAGNOSIS — F329 Major depressive disorder, single episode, unspecified: Secondary | ICD-10-CM | POA: Diagnosis not present

## 2014-05-17 DIAGNOSIS — F32A Depression, unspecified: Secondary | ICD-10-CM

## 2014-05-17 MED ORDER — CITALOPRAM HYDROBROMIDE 10 MG PO TABS: 10.0000 mg | ORAL_TABLET | Freq: Every day | ORAL | Status: DC

## 2014-05-17 NOTE — Progress Notes (Signed)
PATIENT: [place] DOB: [birth year]  REASON FOR VISIT: follow up for seizure HISTORY FROM: patient  HISTORY OF PRESENT ILLNESS: Update 07/14/13 (LL):  Since last visit, has struggled with hip pain since surgery, denies falls, ambulates with a cane.  No recurrent seizures, states taking Dilantin 100 ER QID but level last month was only 3.5, she takes 2 in the morning, 1 in the afternoon and 1 at bedtime.  Right hand tremor is stable. MMSE is stable at 26/30.  Update 09/27/12 (LL): Patient comes in for 6 month follow up for seizures. Doing well, had fractured right hip recently from fall in yard, status post surgery 07/15/12. Released from rehab to home 3 weeks ago. Ambulating with cane, doing well. No further seizures since initial episode. She is on Dilantin 150 mg 3 times a day and seems to the tolerating it well without any dizziness, imbalance, diplopia or dysarthria. Thinks that memory is stable, only notable trouble is remembering numbers. Still has mild tremor of the right hand, reports it is better with Ativan. No new complaints.   Update January 21st 2014 (PS): She returns for followup after last visit on 12/11/2011. She continues to do well without any recurrent seizures following the one during her initial hospitalization in March 2013. She is on Dilantin 150 mg 3 times a day and seems to the tolerating it well without any dizziness, imbalance, diplopia or dysarthria. She continues to have mild short-term memory difficulties which are mainly related to her new memory. She can remember remote incidents quite well. She also complains of mild action tremor of the right hand but it has not been disabling.  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 She also has mild diabetic peripheral neuropathy. Overall doing well. One  syncopal episode related to hypoglycemia.  Update 11/14/13 : She returns for followup of her last visit 4 months ago. She continues to do well without recurrent seizures since the initial one in March 2013. She is currently on Vantin 400 mg daily and seems to be covering it very well. She does have mild tremor of the left hand but denies dizziness, diplopia or vertigo sleepiness. She continues to have mild short-term memory difficulties but she seems to be coping quite well with them and these are not progressive. In fact she is still balancing checkbooks and writing checks and not making any mistakes. She has no new complaints today at Update 05/17/2014 : She returns for follow-up after last visit 6 months ago. Patient continues to do well without recurrent seizures. She has tolerated decrease in the Dilantin to 300 mg daily. She has been depressed and upset by her son's activities. He is been arrested for house breaking and is in jail. She has short-term memory difficulties and needs help even with cooking. She has never been tried on medications for depression and she is willing to try treatment. REVIEW OF SYSTEMS: Full 14 system review of systems performed and notable only for:   , vision difficulties, tremors , depression and memory loss only and all other systems negative   ALLERGIES: Allergies  Allergen Reactions  . Procaine Hcl Anaphylaxis    HOME MEDICATIONS: Outpatient Prescriptions Prior to Visit  Medication Sig Dispense Refill  . atorvastatin (LIPITOR) 40 MG tablet Take 1 tablet daily at bedtime. 30 tablet 2  . calcium-vitamin D (OSCAL WITH  D) 500-200 MG-UNIT per tablet Take 1 tablet by mouth daily.    [name] diltiazem (DILACOR XR) 240 MG 24 hr capsule Take 1 capsule (240 mg total) by mouth daily. 30 capsule 5  . docusate sodium 100 MG CAPS Take 100 mg by mouth 2 (two) times daily. 10 capsule 0  . escitalopram (LEXAPRO) 10 MG tablet Take 1 tablet (10 mg total) by mouth daily. 90 tablet 2  .  insulin NPH-regular Human (NOVOLIN 70/30) (70-30) 100 UNIT/ML injection Inject 15 Units into the skin 2 (two) times daily with a meal.    . LORazepam (ATIVAN) 0.5 MG tablet TAKE ONE TABLET BY MOUTH TWICE DAILY AS NEEDED FOR ANXIETY 60 tablet 5  . metoprolol tartrate (LOPRESSOR) 25 MG tablet Take 0.5 tablets (12.5 mg total) by mouth 2 (two) times daily. 90 tablet 2  . mupirocin ointment (BACTROBAN) 2 % Apply to area twice a day 22 g 0  . phenytoin (DILANTIN) 100 MG ER capsule Take 3 capsules (300 mg total) by mouth daily. 120 capsule 5  . warfarin (COUMADIN) 2.5 MG tablet USE AS DIRECTED BY ANTICOAGULATION CLINIC 40 tablet 3  . insulin NPH-regular Human (NOVOLIN 70/30 RELION) (70-30) 100 UNIT/ML injection Inject 50 units SQ once daily with breakfast 20 vial 2   No facility-administered medications prior to visit.     PHYSICAL EXAM  Filed Vitals:   05/17/14 0848  BP: 153/66  Pulse: 56  Height: 5\' 7"  (1.702 m)  Weight: 207 lb 12.8 oz (94.257 kg)   Body mass index is 32.54 kg/(m^2). No exam data present   Generalized: In no acute distress, very pleasant elderly Caucasian female  Neck: Supple, no carotid bruits  Cardiac: Regular rate rhythm, no murmur  Pulmonary: Clear to auscultation bilaterally  Musculoskeletal: No deformity   Neurological examination  Mentation: Alert oriented to time, place, history taking, language fluent, and casual conversation. MMSE 24/30 today. Diminished recall 1/3. Diminished orientation. Animal fluency test 6 only. Clock drawing 3/4. Geriatric depression scale 9 suggestive of moderate depression Cranial nerve II-XII: Pupils were equal round reactive to light extraocular movements were full, LEGALLY BLIND L EYE.   Facial sensation and strength were normal. hearing was intact to finger rubbing bilaterally. Uvula tongue midline. head turning and shoulder shrug and were normal and symmetric.Tongue protrusion into cheek strength was normal.  MOTOR: normal bulk and  tone, full strength in the BUE, decreased strength 4/5 hip flexion RLE (due to hip surgery) , LLE 5/5 strength. fine finger movements normal, no pronator drift. Fine action and resting tremor of right hand. No cogwheel rigidity or bradykinesia.  SENSORY: normal and symmetric to light touch Vibration and position impaired over toes.  COORDINATION: finger-nose-finger, heel-to-shin bilaterally, there was no truncal ataxia  REFLEXES: Brachioradialis 2/2, biceps 2/2, triceps 2/2, patellar 1/1, Achilles trace/trace  GAIT/STATION: Rising up from seated position without assistance, wide stance, without trunk ataxia, slow but steady athralgic gait with favoring of the right hip. Unable to perform tiptoe, heel walking or tandem.   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 She also has mild diabetic peripheral neuropathy. Overall doing well.   PLAN : I had a long discussion with the patient and her husband regarding her remote seizures which appear quite stable and to continue Dilantin 300 mg daily. She has memory difficulties and mild cognitive impairment which is likely  due to combination of prior encephalitis and untreated depression. Recommend trial of Celexa 10 mg daily. I have discussed side effects with the patient and husband advised them to comment if needed. She was advised to follow-up with her primary physician for further evaluation and treatment for depression. Return for follow-up with me in 6 months or call earlier if necessary.   Meds ordered this encounter  Medications  . citalopram (CELEXA) 10 MG tablet    Sig: Take 1 tablet (10 mg total) by mouth daily.    Dispense:  30 tablet    Refill:  3   Return in about 6 months (around 11/17/2014).  Antony Contras, MD  05/17/2014, 12:42 PM [facility] [address], [place], [place] [address] [phone]  Note: This document was prepared with digital dictation and possible smart phrase technology. Any transcriptional errors that result from this process are unintentional.

## 2014-05-17 NOTE — Patient Instructions (Signed)
I had a long discussion with the patient and her husband regarding her remote seizures which appear quite stable and to continue Dilantin 300 mg daily. She has memory difficulties and mild cognitive impairment which is likely due to combination of prior encephalitis and untreated depression. Recommend trial of Celexa 10 mg daily. I have discussed side effects with the patient and husband advised them to comment if needed. She was advised to follow-up with her primary physician for further evaluation and treatment for depression. Return for follow-up with me in 6 months or call earlier if necessary.

## 2014-06-11 ENCOUNTER — Ambulatory Visit (INDEPENDENT_AMBULATORY_CARE_PROVIDER_SITE_OTHER): Payer: Medicare Other | Admitting: Pharmacist

## 2014-06-11 DIAGNOSIS — I4892 Unspecified atrial flutter: Secondary | ICD-10-CM | POA: Diagnosis not present

## 2014-06-11 DIAGNOSIS — Z7901 Long term (current) use of anticoagulants: Secondary | ICD-10-CM | POA: Diagnosis not present

## 2014-06-11 DIAGNOSIS — Z5181 Encounter for therapeutic drug level monitoring: Secondary | ICD-10-CM | POA: Diagnosis not present

## 2014-06-11 DIAGNOSIS — I4891 Unspecified atrial fibrillation: Secondary | ICD-10-CM | POA: Diagnosis not present

## 2014-06-11 LAB — POCT INR: INR: 2.9

## 2014-06-28 ENCOUNTER — Other Ambulatory Visit: Payer: Self-pay | Admitting: Internal Medicine

## 2014-07-09 ENCOUNTER — Ambulatory Visit (INDEPENDENT_AMBULATORY_CARE_PROVIDER_SITE_OTHER): Payer: Medicare Other | Admitting: *Deleted

## 2014-07-09 DIAGNOSIS — Z5181 Encounter for therapeutic drug level monitoring: Secondary | ICD-10-CM | POA: Diagnosis not present

## 2014-07-09 DIAGNOSIS — I4891 Unspecified atrial fibrillation: Secondary | ICD-10-CM | POA: Diagnosis not present

## 2014-07-09 DIAGNOSIS — I4892 Unspecified atrial flutter: Secondary | ICD-10-CM | POA: Diagnosis not present

## 2014-07-09 DIAGNOSIS — Z7901 Long term (current) use of anticoagulants: Secondary | ICD-10-CM

## 2014-07-09 LAB — POCT INR: INR: 1.7

## 2014-07-17 ENCOUNTER — Ambulatory Visit (INDEPENDENT_AMBULATORY_CARE_PROVIDER_SITE_OTHER): Payer: Medicare Other | Admitting: Endocrinology

## 2014-07-17 ENCOUNTER — Telehealth: Payer: Self-pay | Admitting: Internal Medicine

## 2014-07-17 ENCOUNTER — Encounter: Payer: Self-pay | Admitting: Endocrinology

## 2014-07-17 VITALS — BP 130/80 | HR 62 | Temp 97.9°F | Wt 217.0 lb

## 2014-07-17 DIAGNOSIS — E1042 Type 1 diabetes mellitus with diabetic polyneuropathy: Secondary | ICD-10-CM | POA: Diagnosis not present

## 2014-07-17 DIAGNOSIS — E11319 Type 2 diabetes mellitus with unspecified diabetic retinopathy without macular edema: Secondary | ICD-10-CM

## 2014-07-17 LAB — HEMOGLOBIN A1C: Hgb A1c MFr Bld: 6.7 % — ABNORMAL HIGH (ref 4.6–6.5)

## 2014-07-17 NOTE — Patient Instructions (Addendum)
A diabetes blood test is requested for you today.  We'll let you know about the results.   Please come back for a follow-up appointment in 3 months.   check your blood sugar twice a day.  vary the time of day when you check, between before the 3 meals, and at bedtime.  also check if you have symptoms of your blood sugar being too high or too low.  please keep a record of the readings and bring it to your next appointment here.  please call us sooner if your blood sugar goes below 70, or if you have a lot of readings over 200.   Please reduce the insulin to 30 units with breakfast (if you are going to be active that day, take just 20 units that morning), and 15 units with the evening meal.

## 2014-07-17 NOTE — Telephone Encounter (Signed)
Pre Visit letter sent  °

## 2014-07-17 NOTE — Progress Notes (Signed)
Subjective:    Patient ID: [name] [name], female    DOB: [birth year], 74 y.o.   MRN: [id number]  HPI Pt returns for f/u of diabetes mellitus: DM type: Insulin-requiring type 2 Dx'ed: 0000000 Complications: polyneuropathy and retinopathy. Therapy: insulin since soon after dx.  GDM: never DKA: never Severe hypoglycemia: last episode was mid-2015. Pancreatitis: never.   Other: she is on BID premixed insulin, due to noncompliance with multiple daily injections.  She takes human insulin, due to cost.   Interval history: She takes 35 units qam and 15 qpm.  She says she has been on this dosage x a few years.  She frequently has mild hypoglycemia in the afternoon.  Past Medical History  Diagnosis Date  . Blindness of left eye   . Eye muscle weakness     Right eye weakness after cataract surgery  . Common migraine     ?hx of  . DIABETES MELLITUS, TYPE II 03/27/2006    dr [name]  . HYPERLIPIDEMIA 03/27/2006  . 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 HYPERTENSION 03/27/2006  . Atrial fibrillation     SVT dx 2007, cath 2007 mild CAD, then had an cardioversion, ablation; still on coumadin , has occ palpitation, EKG 03-2010 NSR  . Headache(784.0) 05/07/2009  . LUNG NODULE 09/01/2006    Excision, Bx [facility]  . Osteopenia 2004    Dexa 2004 showed Osteopenia, DEXA 03/2007 normal  . Seizures   . Intertrochanteric fracture of right hip 07/13/2012  . 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 GERD (gastroesophageal reflux disease) 10/05/2011    Past Surgical History  Procedure Laterality Date  . Shoulder surgery    . Femur im nail Right 07/15/2012    Procedure: INTRAMEDULLARY (IM) NAIL HIP;  Surgeon: Johnny Bridge, MD;  Location: [facility];  Service: Orthopedics;  Laterality: Right;    History   Social History  . Marital Status: Married    Spouse Name: Jenny Reichmann  . Number of Children: 1  . Years of Education: College   Occupational History  . retired     Social History Main Topics  . Smoking status:  Former Smoker    Types: Cigarettes    Quit date: 03/02/1978  . Smokeless tobacco: Never Used  . Alcohol Use: No  . Drug Use: No  . Sexual Activity: No   Other Topics Concern  . Not on file   Social History Narrative   Patient lives at home spouse.   Moved from [place] 2004        Current Outpatient Prescriptions on File Prior to Visit  Medication Sig Dispense Refill  . atorvastatin (LIPITOR) 40 MG tablet Take 1 tablet daily at bedtime. 30 tablet 2  . calcium-vitamin D (OSCAL WITH D) 500-200 MG-UNIT per tablet Take 1 tablet by mouth daily.    . citalopram (CELEXA) 10 MG tablet Take 1 tablet (10 mg total) by mouth daily. 30 tablet 3  . diltiazem (DILACOR XR) 240 MG 24 hr capsule Take 1 capsule (240 mg total) by mouth daily. 30 capsule 5  . docusate sodium 100 MG CAPS Take 100 mg by mouth 2 (two) times daily. 10 capsule 0  . escitalopram (LEXAPRO) 10 MG tablet Take 1 tablet (10 mg total) by mouth daily. 90 tablet 2  . insulin NPH-regular Human (NOVOLIN 70/30) (70-30) 100 UNIT/ML injection 30 units with breakfast (if you are going to be active that day, take just 20 units that morning), and 15 units with the evening meal    .  LORazepam (ATIVAN) 0.5 MG tablet TAKE ONE TABLET BY MOUTH TWICE DAILY AS NEEDED FOR ANXIETY 60 tablet 5  . metoprolol tartrate (LOPRESSOR) 25 MG tablet Take 0.5 tablets (12.5 mg total) by mouth 2 (two) times daily. 90 tablet 2  . phenytoin (DILANTIN) 100 MG ER capsule Take 3 capsules (300 mg total) by mouth daily. 120 capsule 5  . warfarin (COUMADIN) 2.5 MG tablet USE AS DIRECTED BY ANTICOAGULATION CLINC 40 tablet 3  . mupirocin ointment (BACTROBAN) 2 % Apply to area twice a day (Patient not taking: Reported on 07/17/2014) 22 g 0   No current facility-administered medications on file prior to visit.    Allergies  Allergen Reactions  . Procaine Hcl Anaphylaxis    Family History  Problem Relation Age of Onset  . Diabetes Father   . Heart attack Father 55  .  Diabetes Sister   . Cancer Neg Hx     no hx of colon or breast cancer    BP 130/80 mmHg  Pulse 62  Temp(Src) 97.9 F (36.6 C) (Oral)  Wt 217 lb (98.431 kg)  SpO2 94%   Review of Systems Denies LOC and weight change    Objective:   Physical Exam VITAL SIGNS:  See vs page GENERAL: no distress Pulses: dorsalis pedis intact bilat.   MSK: no deformity of the feet CV: no leg edema Skin:  no ulcer on the feet.  normal color and temp on the feet. Neuro: sensation is intact to touch on the feet   Lab Results  Component Value Date   HGBA1C 6.7* 07/17/2014       Assessment & Plan:  DM: overcontrolled, given this regimen, which does match insulin to her changing needs throughout the day.  Noncompliance with cbg recording and insulin dosing: I'll work around this as best I can.    Patient is advised the following: Patient Instructions  A diabetes blood test is requested for you today.  We'll let you know about the results.   Please come back for a follow-up appointment in 3 months.   check your blood sugar twice a day.  vary the time of day when you check, between before the 3 meals, and at bedtime.  also check if you have symptoms of your blood sugar being too high or too low.  please keep a record of the readings and bring it to your next appointment here.  please call us sooner if your blood sugar goes below 70, or if you have a lot of readings over 200.   Please reduce the insulin to 30 units with breakfast (if you are going to be active that day, take just 20 units that morning), and 15 units with the evening meal.

## 2014-07-24 ENCOUNTER — Ambulatory Visit (INDEPENDENT_AMBULATORY_CARE_PROVIDER_SITE_OTHER): Payer: Medicare Other | Admitting: Surgery

## 2014-07-24 DIAGNOSIS — Z7901 Long term (current) use of anticoagulants: Secondary | ICD-10-CM | POA: Diagnosis not present

## 2014-07-24 DIAGNOSIS — I4891 Unspecified atrial fibrillation: Secondary | ICD-10-CM

## 2014-07-24 DIAGNOSIS — Z5181 Encounter for therapeutic drug level monitoring: Secondary | ICD-10-CM

## 2014-07-24 DIAGNOSIS — I4892 Unspecified atrial flutter: Secondary | ICD-10-CM | POA: Diagnosis not present

## 2014-07-24 LAB — POCT INR: INR: 2.3

## 2014-08-07 ENCOUNTER — Ambulatory Visit (INDEPENDENT_AMBULATORY_CARE_PROVIDER_SITE_OTHER): Payer: Medicare Other | Admitting: Internal Medicine

## 2014-08-07 ENCOUNTER — Encounter: Payer: Self-pay | Admitting: Internal Medicine

## 2014-08-07 VITALS — BP 124/78 | HR 62 | Temp 97.6°F | Ht 67.0 in | Wt 208.1 lb

## 2014-08-07 DIAGNOSIS — F418 Other specified anxiety disorders: Secondary | ICD-10-CM

## 2014-08-07 DIAGNOSIS — Z1239 Encounter for other screening for malignant neoplasm of breast: Secondary | ICD-10-CM

## 2014-08-07 DIAGNOSIS — Z0000 Encounter for general adult medical examination without abnormal findings: Secondary | ICD-10-CM

## 2014-08-07 DIAGNOSIS — E785 Hyperlipidemia, unspecified: Secondary | ICD-10-CM | POA: Diagnosis not present

## 2014-08-07 DIAGNOSIS — F329 Major depressive disorder, single episode, unspecified: Secondary | ICD-10-CM

## 2014-08-07 DIAGNOSIS — I10 Essential (primary) hypertension: Secondary | ICD-10-CM

## 2014-08-07 DIAGNOSIS — F419 Anxiety disorder, unspecified: Secondary | ICD-10-CM

## 2014-08-07 DIAGNOSIS — F32A Depression, unspecified: Secondary | ICD-10-CM

## 2014-08-07 MED ORDER — ESCITALOPRAM OXALATE 10 MG PO TABS: 10.0000 mg | ORAL_TABLET | Freq: Every day | ORAL | Status: DC

## 2014-08-07 MED ORDER — ATORVASTATIN CALCIUM 40 MG PO TABS: 40.0000 mg | ORAL_TABLET | Freq: Every day | ORAL | Status: DC

## 2014-08-07 MED ORDER — DILTIAZEM HCL ER 240 MG PO CP24: 240.0000 mg | ORAL_CAPSULE | Freq: Every day | ORAL | Status: DC

## 2014-08-07 NOTE — Progress Notes (Signed)
Pre visit review using our clinic review tool, if applicable. No additional management support is needed unless otherwise documented below in the visit note. 

## 2014-08-07 NOTE — Patient Instructions (Addendum)
Get your blood work before you leave  Do not take Celexa (CITALOPRAM) Take Lexapro ( ESCITALOPRAM)      Fall Prevention and Home Safety Falls cause injuries and can affect all age groups. It is possible to use preventive measures to significantly decrease the likelihood of falls. There are many simple measures which can make your home safer and prevent falls. OUTDOORS  Repair cracks and edges of walkways and driveways.  Remove high doorway thresholds.  Trim shrubbery on the main path into your home.  Have good outside lighting.  Clear walkways of tools, rocks, debris, and clutter.  Check that handrails are not broken and are securely fastened. Both sides of steps should have handrails.  Have leaves, snow, and ice cleared regularly.  Use sand or salt on walkways during winter months.  In the garage, clean up grease or oil spills. BATHROOM  Install night lights.  Install grab bars by the toilet and in the tub and shower.  Use non-skid mats or decals in the tub or shower.  Place a plastic non-slip stool in the shower to sit on, if needed.  Keep floors dry and clean up all water on the floor immediately.  Remove soap buildup in the tub or shower on a regular basis.  Secure bath mats with non-slip, double-sided rug tape.  Remove throw rugs and tripping hazards from the floors. BEDROOMS  Install night lights.  Make sure a bedside light is easy to reach.  Do not use oversized bedding.  Keep a telephone by your bedside.  Have a firm chair with side arms to use for getting dressed.  Remove throw rugs and tripping hazards from the floor. KITCHEN  Keep handles on pots and pans turned toward the center of the stove. Use back burners when possible.  Clean up spills quickly and allow time for drying.  Avoid walking on wet floors.  Avoid hot utensils and knives.  Position shelves so they are not too high or low.  Place commonly used objects within easy  reach.  If necessary, use a sturdy step stool with a grab bar when reaching.  Keep electrical cables out of the way.  Do not use floor polish or wax that makes floors slippery. If you must use wax, use non-skid floor wax.  Remove throw rugs and tripping hazards from the floor. STAIRWAYS  Never leave objects on stairs.  Place handrails on both sides of stairways and use them. Fix any loose handrails. Make sure handrails on both sides of the stairways are as long as the stairs.  Check carpeting to make sure it is firmly attached along stairs. Make repairs to worn or loose carpet promptly.  Avoid placing throw rugs at the top or bottom of stairways, or properly secure the rug with carpet tape to prevent slippage. Get rid of throw rugs, if possible.  Have an electrician put in a light switch at the top and bottom of the stairs. OTHER FALL PREVENTION TIPS  Wear low-heel or rubber-soled shoes that are supportive and fit well. Wear closed toe shoes.  When using a stepladder, make sure it is fully opened and both spreaders are firmly locked. Do not climb a closed stepladder.  Add color or contrast paint or tape to grab bars and handrails in your home. Place contrasting color strips on first and last steps.  Learn and use mobility aids as needed. Install an electrical emergency response system.  Turn on lights to avoid dark areas. Replace light bulbs  that burn out immediately. Get light switches that glow.  Arrange furniture to create clear pathways. Keep furniture in the same place.  Firmly attach carpet with non-skid or double-sided tape.  Eliminate uneven floor surfaces.  Select a carpet pattern that does not visually hide the edge of steps.  Be aware of all pets. OTHER HOME SAFETY TIPS  Set the water temperature for 120 F (48.8 C).  Keep emergency numbers on or near the telephone.  Keep smoke detectors on every level of the home and near sleeping areas. Document Released:  02/06/2002 Document Revised: 08/18/2011 Document Reviewed: 05/08/2011 [facility] Patient Information 2015 [place], [place]. This information is not intended to replace advice given to you by your health care provider. Make sure you discuss any questions you have with your health care provider.   Preventive Care for Adults Ages 20 and over  Blood pressure check.** / Every 1 to 2 years.  Lipid and cholesterol check.**/ Every 5 years beginning at age 64.  Lung cancer screening. / Every year if you are aged 19-80 years and have a 30-pack-year history of smoking and currently smoke or have quit within the past 15 years. Yearly screening is stopped once you have quit smoking for at least 15 years or develop a health problem that would prevent you from having lung cancer treatment.  Fecal occult blood test (FOBT) of stool. / Every year beginning at age 28 and continuing until age 93. You may not have to do this test if you get a colonoscopy every 10 years.  Flexible sigmoidoscopy** or colonoscopy.** / Every 5 years for a flexible sigmoidoscopy or every 10 years for a colonoscopy beginning at age 27 and continuing until age 16.  Hepatitis C blood test.** / For all people born from [birth year] through 1964 and any individual with known risks for hepatitis C.  Abdominal aortic aneurysm (AAA) screening.** / A one-time screening for ages 54 to 21 years who are current or former smokers.  Skin self-exam. / Monthly.  Influenza vaccine. / Every year.  Tetanus, diphtheria, and acellular pertussis (Tdap/Td) vaccine.** / 1 dose of Td every 10 years.  Varicella vaccine.** / Consult your health care provider.  Zoster vaccine.** / 1 dose for adults aged 69 years or older.  Pneumococcal 13-valent conjugate (PCV13) vaccine.** / Consult your health care provider.  Pneumococcal polysaccharide (PPSV23) vaccine.** / 1 dose for all adults aged 10 years and older.  Meningococcal vaccine.** / Consult your health care  provider.  Hepatitis A vaccine.** / Consult your health care provider.  Hepatitis B vaccine.** / Consult your health care provider.  Haemophilus influenzae type b (Hib) vaccine.** / Consult your health care provider. **Family history and personal history of risk and conditions may change your health care provider's recommendations. Document Released: 04/14/2001 Document Revised: 02/21/2013 Document Reviewed: 07/14/2010 [facility] Patient Information 2015 [place], [place]. This information is not intended to replace advice given to you by your health care provider. Make sure you discuss any questions you have with your health care provider.

## 2014-08-07 NOTE — Assessment & Plan Note (Addendum)
Td 2012  PNM 23-- 2014 prevnar, not available today, will provide at the next opportunity zostavax--2015  Breast exam normal , will schedule another MMG  No interested in further PAPs, see previous entries    Colon cancer screening discussed: Colonoscopy, cologuard,  IFOB test--patient elected iFOB, aware is the least  Accurate  Diet-exercise discussed

## 2014-08-07 NOTE — Assessment & Plan Note (Signed)
  Patient mildly depressed due to social stressors but denies suicidal ideation. Apparently pt on both Celexa and Lexapro (celexa rx by neuro), will discontinue Celexa and stay on Lexapro. Patient to follow up in 4 months.

## 2014-08-07 NOTE — Progress Notes (Signed)
Subjective:    Patient ID: [name] [name], female    DOB: 1940/08/31, 74 y.o.   MRN: [id number]  DOS:  08/07/2014 Type of visit - description :     Here for Medicare AWV:   1. Risk factors based on Past M, S, F history: REVIEWED   2. Physical Activities: has a hart time walking (d/t leg pain from Fx), minor home chores   3. Depression/mood:  ++ stress at home, occ depression, no suicidal    4. Hearing: normal. no problems noted at normal conversation   5. ADL's: unable to drive d/t poor vision , otherwise independent   6. Fall Risk: no recent falls, counseled  , see AVS 7. Home Safety: DOES FEEL SAFE AT HOME   8. Height, weight, &visual acuity: see VS, chronic poor vision, sees doctors frequently   9. Counseling: yes   10. Labs ordered based on risk factors: if needed   11. Referral Coordination, if needed   12. Care Plan, see a/p  , written instructions provided  13. Cognitive Assessment -- motor skills decreased compared to previous years, memory and cognition seem adequate  14. Care team updated -- Dr [name], DR Lovena Le, ophtalmologist 15. End of life care discussed , has a healthcare POA   in addition, we discussed the following  Patient is a 74 year old female with a history of HLD, HTN, GERD, DM, depression, and osteopenia in today for her annual physical exam.  HTN: Patient notes good compliance to medications, checking BP regularly at home where it is within normal limits. No side effects from medications.  HLD: Patient notes good compliance to medications, denies any side effects.   Osteopenia: Patient is currently adhering to Calcium and Vitamin D supplementation and denies any recent falls.   GERD: Reflux is well-controlled with Tums prn and occasional Zantac if Tums is not enough.   Seizure: Well-controlled on Dilantin , no concerns regarding medications. No recent seizure episodes.  Depression: Patient is mildly depressed, her son is currently living with her  which is adding anxiety to the situation. Patient has been taking Lexapro for this condition and notes that it is helping her. She has no interest in increasing or decreasing the dose of Lexapro. Denies suicidal ideation.   Review of Systems  Constitutional: No fever. No chills. No unexplained wt changes. No unusual sweats  HEENT: No dental problems, no ear discharge, no facial swelling, no voice changes. No eye  redness , no  intolerance to light  Cannot see out of left eye. Seen ophthalmology.  Respiratory: No wheezing , no  difficulty breathing. No cough , no mucus production  Cardiovascular: No CP, no leg swelling , no  Palpitations  GI: no nausea, no vomiting, no diarrhea , no  abdominal pain.  No blood in the stools. No dysphagia, no odynophagia    Endocrine: No polyphagia, no polyuria , no polydipsia  GU: No dysuria, gross hematuria, difficulty urinating. No urinary urgency, no frequency.  Musculoskeletal: No joint swellings or unusual aches or pains  Skin: No change in the color of the skin, palor , no  Rash  Allergic, immunologic: No environmental allergies , no food allergies  Neurological: No dizziness no  syncope. No headaches. No diplopia, no slurred speech, no motor deficits, no facial  Numbness  Hematological: No enlarged lymph nodes, no easy bruising , no unusual bleedings  Psychiatry: No suicidal ideas, no hallucinations, no beavior problems, no confusion.  Past Medical History  Diagnosis Date  . Blindness of left eye   . Eye muscle weakness     Right eye weakness after cataract surgery  . Common migraine     ?hx of  . DIABETES MELLITUS, TYPE II 03/27/2006    dr [name]  . HYPERLIPIDEMIA 03/27/2006  . 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 HYPERTENSION 03/27/2006  . Atrial fibrillation     SVT dx 2007, cath 2007 mild CAD, then had an cardioversion, ablation; still on coumadin , has occ palpitation, EKG 03-2010 NSR  . Headache(784.0) 05/07/2009  . LUNG  NODULE 09/01/2006    Excision, Bx [facility]  . Osteopenia 2004    Dexa 2004 showed Osteopenia, DEXA 03/2007 normal  . Seizures   . Intertrochanteric fracture of right hip 07/13/2012  . 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 GERD (gastroesophageal reflux disease) 10/05/2011    Past Surgical History  Procedure Laterality Date  . Shoulder surgery    . Femur im nail Right 07/15/2012    Procedure: INTRAMEDULLARY (IM) NAIL HIP;  Surgeon: Johnny Bridge, MD;  Location: [facility];  Service: Orthopedics;  Laterality: Right;    History   Social History  . Marital Status: Married    Spouse Name: Jenny Reichmann  . Number of Children: 1  . Years of Education: College   Occupational History  . retired     Social History Main Topics  . Smoking status: Former Smoker    Types: Cigarettes    Quit date: 03/02/1978  . Smokeless tobacco: Never Used  . Alcohol Use: No  . Drug Use: No  . Sexual Activity: No   Other Topics Concern  . Not on file   Social History Narrative   Patient lives at home spouse.   Moved from [place] 2004         Family History  Problem Relation Age of Onset  . Diabetes Father   . Heart attack Father 44  . Diabetes Sister   . Cancer Neg Hx     no hx of colon or breast cancer      Medication List       This list is accurate as of: 08/07/14 10:28 AM.  Always use your most recent med list.               atorvastatin 40 MG tablet  Commonly known as:  LIPITOR  Take 1 tablet daily at bedtime.     calcium-vitamin D 500-200 MG-UNIT per tablet  Commonly known as:  OSCAL WITH D  Take 1 tablet by mouth daily.     citalopram 10 MG tablet  Commonly known as:  CELEXA  Take 1 tablet (10 mg total) by mouth daily.     diltiazem 240 MG 24 hr capsule  Commonly known as:  DILACOR XR  Take 1 capsule (240 mg total) by mouth daily.     DSS 100 MG Caps  Take 100 mg by mouth 2 (two) times daily.     escitalopram 10 MG tablet  Commonly known as:  LEXAPRO  Take 1 tablet (10 mg total) by mouth  daily.     insulin NPH-regular Human (70-30) 100 UNIT/ML injection  Commonly known as:  NOVOLIN 70/30  30 units with breakfast (if you are going to be active that day, take just 20 units that morning), and 15 units with the evening meal     LORazepam 0.5 MG tablet  Commonly known as:  ATIVAN  TAKE ONE TABLET BY MOUTH  TWICE DAILY AS NEEDED FOR ANXIETY     metoprolol tartrate 25 MG tablet  Commonly known as:  LOPRESSOR  Take 0.5 tablets (12.5 mg total) by mouth 2 (two) times daily.     mupirocin ointment 2 %  Commonly known as:  BACTROBAN  Apply to area twice a day     phenytoin 100 MG ER capsule  Commonly known as:  DILANTIN  Take 3 capsules (300 mg total) by mouth daily.     warfarin 2.5 MG tablet  Commonly known as:  COUMADIN  USE AS DIRECTED BY ANTICOAGULATION [facility]           Objective:   Physical Exam BP 124/78 mmHg  Pulse 62  Temp(Src) 97.6 F (36.4 C) (Oral)  Ht 5\' 7"  (1.702 m)  Wt 208 lb 2 oz (94.405 kg)  BMI 32.59 kg/m2  SpO2 98%  General:   Well developed, well nourished . NAD.  HEENT:  Normocephalic . Face symmetric, atraumatic Lungs:  CTA B Normal respiratory effort, no intercostal retractions, no accessory muscle use. Heart: RRR,  no murmur.  no pretibial edema bilaterally  Abdomen:  Not distended, soft, non-tender. No rebound or rigidity. No mass,organomegaly Skin: Not pale. Not jaundice Neurologic:  alert & oriented X3.  Speech normal, gait appropriate for age and unassisted Psych--  Cognition and judgment appear intact.  Cooperative with normal attention span and concentration.  Behavior appropriate. No anxious or depressed appearing. Breast: no dominant mass, skin and nipples normal to inspection on palpation, axillary areas without mass or lymphadenopathy         Assessment & Plan:   (Patient seen along with   Aletta Edouard, medical student)  HLD: Good compliance to medications, will check labs today and adjust medication  pending lab result.  HTN: Well-controlled on current medications, BP today 124/78. No side effects of medication. No changes to meds.  GERD: Well-controlled with Tums prn and Zantac for occasional reflux that does not respond to Tums.  Osteopenia: Recent BMD 05/2013 showed T-score of -1.8. Patient adhering to Calcium and Vitamin D. No recent falls, fall prevention discussed with patient.   Seizure Disorder: Well-controlled on Dilantin

## 2014-08-08 ENCOUNTER — Telehealth: Payer: Self-pay | Admitting: Internal Medicine

## 2014-08-08 NOTE — Telephone Encounter (Signed)
Caller name: Shirlee Limerick at [facility] Relation to pt: Call back number: [phone] Pharmacy:  Reason for call:   Needs clarification on lexapro.

## 2014-08-08 NOTE — Telephone Encounter (Signed)
Spoke with Shirlee Limerick, she informed me that the Pt had Citalopram filled 2 weeks ago which was written by Dr. Leonie Man, I informed her that Pt was here yesterday and reported that she is taking Lexapro. Shirlee Limerick informed that she would call Pt and clarify.

## 2014-08-09 ENCOUNTER — Other Ambulatory Visit: Payer: Self-pay | Admitting: Internal Medicine

## 2014-08-09 DIAGNOSIS — Z1239 Encounter for other screening for malignant neoplasm of breast: Secondary | ICD-10-CM

## 2014-08-10 ENCOUNTER — Telehealth: Payer: Self-pay | Admitting: Internal Medicine

## 2014-08-10 NOTE — Telephone Encounter (Signed)
Did we prescribe a new medication this week at visit? I looked through [facility] notes and did not see anything other than the Lexapro?

## 2014-08-10 NOTE — Telephone Encounter (Signed)
Relation to pt: self  Call back number: [phone]   Reason for call:  Pt received a call from pharmacy regarding new medication in need of clarification. Please advise

## 2014-08-10 NOTE — Telephone Encounter (Signed)
I was under the impression that she was taking both Celexa and Lexapro, I recommend her to take only Lexapro. Her current medication list is accurate so what ever is in  our list she needs to be taking

## 2014-08-13 NOTE — Telephone Encounter (Signed)
Spoke with Pt, informed her that we have changed policies on sending in prescriptions for refills, we send in the Rx so that way they have it when needed but sometimes the pharmacy does go ahead and fill medication. Pt verbalized understanding.

## 2014-08-14 ENCOUNTER — Ambulatory Visit (HOSPITAL_BASED_OUTPATIENT_CLINIC_OR_DEPARTMENT_OTHER)
Admission: RE | Admit: 2014-08-14 | Discharge: 2014-08-14 | Disposition: A | Discharge status: Home / Self Care | Payer: Medicare Other | Source: Ambulatory Visit | Attending: Internal Medicine | Admitting: Internal Medicine

## 2014-08-14 DIAGNOSIS — Z1231 Encounter for screening mammogram for malignant neoplasm of breast: Secondary | ICD-10-CM | POA: Diagnosis not present

## 2014-08-14 DIAGNOSIS — Z1239 Encounter for other screening for malignant neoplasm of breast: Secondary | ICD-10-CM

## 2014-08-15 ENCOUNTER — Telehealth: Payer: Self-pay | Admitting: Lab

## 2014-08-21 ENCOUNTER — Ambulatory Visit (INDEPENDENT_AMBULATORY_CARE_PROVIDER_SITE_OTHER): Payer: Medicare Other | Admitting: *Deleted

## 2014-08-21 DIAGNOSIS — Z7901 Long term (current) use of anticoagulants: Secondary | ICD-10-CM

## 2014-08-21 DIAGNOSIS — Z5181 Encounter for therapeutic drug level monitoring: Secondary | ICD-10-CM | POA: Diagnosis not present

## 2014-08-21 DIAGNOSIS — I4892 Unspecified atrial flutter: Secondary | ICD-10-CM | POA: Diagnosis not present

## 2014-08-21 DIAGNOSIS — I4891 Unspecified atrial fibrillation: Secondary | ICD-10-CM

## 2014-08-21 LAB — POCT INR: INR: 1.7

## 2014-08-23 ENCOUNTER — Encounter (HOSPITAL_COMMUNITY): Payer: Self-pay | Admitting: Emergency Medicine

## 2014-08-23 ENCOUNTER — Emergency Department (HOSPITAL_COMMUNITY): Payer: Medicare Other

## 2014-08-23 ENCOUNTER — Emergency Department (HOSPITAL_COMMUNITY)
Admission: EM | Admit: 2014-08-23 | Discharge: 2014-08-23 | Disposition: A | Discharge status: Home / Self Care | Payer: Medicare Other | Attending: Emergency Medicine | Admitting: Emergency Medicine

## 2014-08-23 DIAGNOSIS — R55 Syncope and collapse: Secondary | ICD-10-CM | POA: Diagnosis not present

## 2014-08-23 DIAGNOSIS — E11649 Type 2 diabetes mellitus with hypoglycemia without coma: Secondary | ICD-10-CM | POA: Insufficient documentation

## 2014-08-23 DIAGNOSIS — I4891 Unspecified atrial fibrillation: Secondary | ICD-10-CM | POA: Diagnosis not present

## 2014-08-23 DIAGNOSIS — H5442 Blindness, left eye, normal vision right eye: Secondary | ICD-10-CM | POA: Diagnosis not present

## 2014-08-23 DIAGNOSIS — Z8709 Personal history of other diseases of the respiratory system: Secondary | ICD-10-CM | POA: Diagnosis not present

## 2014-08-23 DIAGNOSIS — I10 Essential (primary) hypertension: Secondary | ICD-10-CM | POA: Insufficient documentation

## 2014-08-23 DIAGNOSIS — Z8619 Personal history of other infectious and parasitic diseases: Secondary | ICD-10-CM | POA: Diagnosis not present

## 2014-08-23 DIAGNOSIS — Z8781 Personal history of (healed) traumatic fracture: Secondary | ICD-10-CM | POA: Insufficient documentation

## 2014-08-23 DIAGNOSIS — G40909 Epilepsy, unspecified, not intractable, without status epilepticus: Secondary | ICD-10-CM | POA: Diagnosis not present

## 2014-08-23 DIAGNOSIS — I469 Cardiac arrest, cause unspecified: Secondary | ICD-10-CM | POA: Insufficient documentation

## 2014-08-23 DIAGNOSIS — M8580 Other specified disorders of bone density and structure, unspecified site: Secondary | ICD-10-CM | POA: Insufficient documentation

## 2014-08-23 DIAGNOSIS — Z8719 Personal history of other diseases of the digestive system: Secondary | ICD-10-CM | POA: Insufficient documentation

## 2014-08-23 DIAGNOSIS — E785 Hyperlipidemia, unspecified: Secondary | ICD-10-CM | POA: Insufficient documentation

## 2014-08-23 LAB — BASIC METABOLIC PANEL
Anion gap: 9 (ref 5–15)
BUN: 15 mg/dL (ref 6–20)
CO2: 27 mmol/L (ref 22–32)
Calcium: 8.9 mg/dL (ref 8.9–10.3)
Chloride: 103 mmol/L (ref 101–111)
Creatinine, Ser: 1.15 mg/dL — ABNORMAL HIGH (ref 0.44–1.00)
GFR calc non Af Amer: 46 mL/min — ABNORMAL LOW (ref >60)
GFR, EST AFRICAN AMERICAN: 54 mL/min — AB (ref >60)
Glucose, Bld: 99 mg/dL (ref 65–99)
Potassium: 3.8 mmol/L (ref 3.5–5.1)
SODIUM: 139 mmol/L (ref 135–145)

## 2014-08-23 LAB — CBC WITH DIFFERENTIAL/PLATELET
BASOS PCT: 0 % (ref 0–1)
Basophils Absolute: 0 10*3/uL (ref 0.0–0.1)
EOS ABS: 0.1 10*3/uL (ref 0.0–0.7)
EOS PCT: 1 % (ref 0–5)
HCT: 36.4 % (ref 36.0–46.0)
Hemoglobin: 11.8 g/dL — ABNORMAL LOW (ref 12.0–15.0)
LYMPHS ABS: 1.8 10*3/uL (ref 0.7–4.0)
Lymphocytes Relative: 17 % (ref 12–46)
MCH: 29.8 pg (ref 26.0–34.0)
MCHC: 32.4 g/dL (ref 30.0–36.0)
MCV: 91.9 fL (ref 78.0–100.0)
Monocytes Absolute: 0.9 10*3/uL (ref 0.1–1.0)
Monocytes Relative: 9 % (ref 3–12)
NEUTROS PCT: 73 % (ref 43–77)
Neutro Abs: 7.7 10*3/uL (ref 1.7–7.7)
PLATELETS: 308 10*3/uL (ref 150–400)
RBC: 3.96 MIL/uL (ref 3.87–5.11)
RDW: 12.8 % (ref 11.5–15.5)
WBC: 10.5 10*3/uL (ref 4.0–10.5)

## 2014-08-23 LAB — URINALYSIS, ROUTINE W REFLEX MICROSCOPIC
Bilirubin Urine: NEGATIVE
GLUCOSE, UA: NEGATIVE mg/dL
Ketones, ur: NEGATIVE mg/dL
Nitrite: POSITIVE — AB
PROTEIN: NEGATIVE mg/dL
SPECIFIC GRAVITY, URINE: 1.006 (ref 1.005–1.030)
UROBILINOGEN UA: 0.2 mg/dL (ref 0.0–1.0)
pH: 6 (ref 5.0–8.0)

## 2014-08-23 LAB — PROTIME-INR
INR: 1.72 — AB (ref 0.00–1.49)
PROTHROMBIN TIME: 20.2 s — AB (ref 11.6–15.2)

## 2014-08-23 LAB — URINE MICROSCOPIC-ADD ON

## 2014-08-23 LAB — I-STAT TROPONIN, ED: Troponin i, poc: 0 ng/mL (ref 0.00–0.08)

## 2014-08-23 LAB — CBG MONITORING, ED: Glucose-Capillary: 119 mg/dL — ABNORMAL HIGH (ref 65–99)

## 2014-08-23 MED ORDER — CEPHALEXIN 500 MG PO CAPS
500.0000 mg | ORAL_CAPSULE | Freq: Four times a day (QID) | ORAL | Status: DC
Start: 2014-08-23 — End: 2014-08-28

## 2014-08-23 NOTE — ED Notes (Signed)
Per EMS- pt family originally called 911 for hypoglycemia, then the call changed to cardiac arrest. Pt family reported that she lost consciousness and did not have a pulse/was not breathing. Compression only CPR was initialed by family for approx 1 minute. FD arrived and was able to get a pulse at 60 and p BP 160/palp.Pt was still unconsciousness when paramedics arrived. When pt regained consciousness, she was a/o x 4. Pt was given 286ml of D10. Pt has hx of CVA, seizure disorder, htn, afib. Pt remembers events leading up to syncope. No pain or distress reported.

## 2014-08-23 NOTE — Discharge Instructions (Signed)
You were evaluated in the ED today after he lost consciousness. There is not appear to be an emergent cause for her symptoms at this time. It is likely that her blood sugar got too low and this is what caused your syncope. Your labs, EKG and CT of her head are all reassuring. Please take your antibiotic as to record for your UTI. Please follow-up with primary care for further evaluation and management of your symptoms. Return to ED for new or worsening symptoms.  Syncope Syncope is a medical term for fainting or passing out. This means you lose consciousness and drop to the ground. People are generally unconscious for less than 5 minutes. You may have some muscle twitches for up to 15 seconds before waking up and returning to normal. Syncope occurs more often in older adults, but it can happen to anyone. While most causes of syncope are not dangerous, syncope can be a sign of a serious medical problem. It is important to seek medical care.  CAUSES  Syncope is caused by a sudden drop in blood flow to the brain. The specific cause is often not determined. Factors that can bring on syncope include:  Taking medicines that lower blood pressure.  Sudden changes in posture, such as standing up quickly.  Taking more medicine than prescribed.  Standing in one place for too long.  Seizure disorders.  Dehydration and excessive exposure to heat.  Low blood sugar (hypoglycemia).  Straining to have a bowel movement.  Heart disease, irregular heartbeat, or other circulatory problems.  Fear, emotional distress, seeing blood, or severe pain. SYMPTOMS  Right before fainting, you may:  Feel dizzy or light-headed.  Feel nauseous.  See all white or all black in your field of vision.  Have cold, clammy skin. DIAGNOSIS  Your health care provider will ask about your symptoms, perform a physical exam, and perform an electrocardiogram (ECG) to record the electrical activity of your heart. Your health care  provider may also perform other heart or blood tests to determine the cause of your syncope which may include:  Transthoracic echocardiogram (TTE). During echocardiography, sound waves are used to evaluate how blood flows through your heart.  Transesophageal echocardiogram (TEE).  Cardiac monitoring. This allows your health care provider to monitor your heart rate and rhythm in real time.  Holter monitor. This is a portable device that records your heartbeat and can help diagnose heart arrhythmias. It allows your health care provider to track your heart activity for several days, if needed.  Stress tests by exercise or by giving medicine that makes the heart beat faster. TREATMENT  In most cases, no treatment is needed. Depending on the cause of your syncope, your health care provider may recommend changing or stopping some of your medicines. HOME CARE INSTRUCTIONS  Have someone stay with you until you feel stable.  Do not drive, use machinery, or play sports until your health care provider says it is okay.  Keep all follow-up appointments as directed by your health care provider.  Lie down right away if you start feeling like you might faint. Breathe deeply and steadily. Wait until all the symptoms have passed.  Drink enough fluids to keep your urine clear or pale yellow.  If you are taking blood pressure or heart medicine, get up slowly and take several minutes to sit and then stand. This can reduce dizziness. SEEK IMMEDIATE MEDICAL CARE IF:   You have a severe headache.  You have unusual pain in the chest,  abdomen, or back.  You are bleeding from your mouth or rectum, or you have black or tarry stool.  You have an irregular or very fast heartbeat.  You have pain with breathing.  You have repeated fainting or seizure-like jerking during an episode.  You faint when sitting or lying down.  You have confusion.  You have trouble walking.  You have severe weakness.  You  have vision problems. If you fainted, call your local emergency services (911 in [place] Do not drive yourself to the hospital.  MAKE SURE YOU:  Understand these instructions.  Will watch your condition.  Will get help right away if you are not doing well or get worse. Document Released: 02/16/2005 Document Revised: 02/21/2013 Document Reviewed: 04/17/2011 [facility] Patient Information 2015 [place], [place]. This information is not intended to replace advice given to you by your health care provider. Make sure you discuss any questions you have with your health care provider.

## 2014-08-23 NOTE — ED Provider Notes (Signed)
CSN: [id number]     Arrival date & time 08/23/14  1416 History   First MD Initiated Contact with Patient 08/23/14 1417     Chief Complaint  Patient presents with  . Loss of Consciousness  . Hypoglycemia  . Cardiac Arrest     (Consider location/radiation/quality/duration/timing/severity/associated sxs/prior Treatment) HPI [name] [name] is a 74 y.o. female  history of CVA, A. fib on Coumadin, seizures on Dilantin, diabetes on insulin, comes in for evaluation of syncope. Patient reports approximately 30 minutes ago she was preparing lunch when she began to feel like her sugar was getting low. She reports having a glass of orange juice, but this did not help. She reports passing out in her chair. She denies hitting her head. She reports a family member lowered her to the ground. She denies any discomfort down in the ED and reports she is ready to go home. She denies headache, vision changes, intraoral trauma, chest pain, shortness of breath, confusion, loss of bowel or bladder function, nausea or vomiting, diaphoresis. Per son and husband in the room, son reports finding patient sitting upright in her chair and was unresponsive. He contacted EMS which instructed him to lower her to the floor, which he did. He reports that she was unresponsive for 1-1/2-2 minutes. He reports doing 6 chest compressions and at that time the patient was fully responsive again and at baseline.  Per EMS-"pt family originally called 911 for hypoglycemia, then the call changed to cardiac arrest. Pt family reported that she lost consciousness and did not have a pulse/was not breathing. Compression only CPR was initialed by family for approx 1 minute. FD arrived and was able to get a pulse at 60 and p BP 160/palp.Pt was still unconsciousness when paramedics arrived. When pt regained consciousness, she was a/o x 4. Pt was given 266ml of D10."   Past Medical History  Diagnosis Date  . Blindness of left eye   . Eye muscle  weakness     Right eye weakness after cataract surgery  . Common migraine     ?hx of  . DIABETES MELLITUS, TYPE II 03/27/2006    dr [name]  . HYPERLIPIDEMIA 03/27/2006  . 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 HYPERTENSION 03/27/2006  . Atrial fibrillation     SVT dx 2007, cath 2007 mild CAD, then had an cardioversion, ablation; still on coumadin , has occ palpitation, EKG 03-2010 NSR  . Headache(784.0) 05/07/2009  . LUNG NODULE 09/01/2006    Excision, Bx Benighn  . Osteopenia 2004    Dexa 2004 showed Osteopenia, DEXA 03/2007 normal  . Seizures   . Intertrochanteric fracture of right hip 07/13/2012  . 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 GERD (gastroesophageal reflux disease) 10/05/2011   Past Surgical History  Procedure Laterality Date  . Shoulder surgery    . Femur im nail Right 07/15/2012    Procedure: INTRAMEDULLARY (IM) NAIL HIP;  Surgeon: Johnny Bridge, MD;  Location: [place];  Service: Orthopedics;  Laterality: Right;   Family History  Problem Relation Age of Onset  . Diabetes Father   . Heart attack Father 59  . Diabetes Sister   . Cancer Neg Hx     no hx of colon or breast cancer   History  Substance Use Topics  . Smoking status: Former Smoker    Types: Cigarettes    Quit date: 03/02/1978  . Smokeless tobacco: Never Used  . Alcohol Use: No   OB History    No data  available     Review of Systems A 10 point review of systems was completed and was negative except for pertinent positives and negatives as mentioned in the history of present illness     Allergies  Procaine hcl  Home Medications   Prior to Admission medications   Medication Sig Start Date End Date Taking? Authorizing Provider  atorvastatin (LIPITOR) 40 MG tablet Take 1 tablet (40 mg total) by mouth at bedtime. 08/07/14  Yes Colon Branch, MD  calcium-vitamin D (OSCAL WITH D) 500-200 MG-UNIT per tablet Take 1 tablet by mouth daily.   Yes Historical Provider, MD  citalopram (CELEXA) 10 MG tablet Take 10 mg by  mouth daily.   Yes Historical Provider, MD  diltiazem (DILACOR XR) 240 MG 24 hr capsule Take 1 capsule (240 mg total) by mouth daily. 08/07/14  Yes Colon Branch, MD  escitalopram (LEXAPRO) 10 MG tablet Take 1 tablet (10 mg total) by mouth daily. 08/07/14  Yes Colon Branch, MD  insulin NPH-regular Human (NOVOLIN 70/30) (70-30) 100 UNIT/ML injection Inject 15-35 Units into the skin See admin instructions. 35 units with breakfast, 15 units with the evening meal   Yes Historical Provider, MD  LORazepam (ATIVAN) 0.5 MG tablet TAKE ONE TABLET BY MOUTH TWICE DAILY AS NEEDED FOR ANXIETY 05/16/14  Yes Colon Branch, MD  metoprolol tartrate (LOPRESSOR) 25 MG tablet Take 0.5 tablets (12.5 mg total) by mouth 2 (two) times daily. 04/24/14  Yes Colon Branch, MD  Multiple Vitamins-Minerals (CENTRUM SILVER ADULT 50+ PO) Take 1 tablet by mouth every morning.   Yes Historical Provider, MD  phenytoin (DILANTIN) 100 MG ER capsule Take 3 capsules (300 mg total) by mouth daily. 03/19/14  Yes Colon Branch, MD  warfarin (COUMADIN) 2.5 MG tablet USE AS DIRECTED BY ANTICOAGULATION [facility] Patient taking differently: USE AS DIRECTED BY ANTICOAGULATION [facility]. pt takes 2.5mg  daily 06/29/14  Yes Evans Lance, MD  cephALEXin (KEFLEX) 500 MG capsule Take 1 capsule (500 mg total) by mouth 4 (four) times daily. 08/23/14   Comer Locket, PA-C  docusate sodium 100 MG CAPS Take 100 mg by mouth 2 (two) times daily. Patient not taking: Reported on 08/07/2014 07/17/12   Donne Hazel, MD   BP 119/63 mmHg  Pulse 57  Temp(Src) 97.5 F (36.4 C) (Oral)  Resp 15  Ht 5\' 10"  (1.778 m)  Wt 210 lb (95.255 kg)  BMI 30.13 kg/m2  SpO2 97% Physical Exam  Constitutional: She is oriented to person, place, and time. She appears well-developed and well-nourished.  HENT:  Head: Normocephalic and atraumatic.  Mouth/Throat: Oropharynx is clear and moist.  Eyes: Conjunctivae are normal. Pupils are equal, round, and reactive to light. Right eye exhibits no discharge.  Left eye exhibits no discharge. No scleral icterus.  Neck: Neck supple.  Cardiovascular: Normal rate, regular rhythm and normal heart sounds.   Pulmonary/Chest: Effort normal and breath sounds normal. No respiratory distress. She has no wheezes. She has no rales.  Abdominal: Soft. There is no tenderness.  Musculoskeletal: She exhibits no tenderness.  Neurological: She is alert and oriented to person, place, and time.  Cranial Nerves II-XII grossly intact. Motor and sensation 5/5 in all 4 extremities. Completes fine motor movements without ataxia.  Speaks articulately and answers questions appropriately  Skin: Skin is warm and dry. No rash noted.  Psychiatric: She has a normal mood and affect.  Nursing note and vitals reviewed.   ED Course  Procedures (including critical care  time) Labs Review Labs Reviewed  BASIC METABOLIC PANEL - Abnormal; Notable for the following:    Creatinine, Ser 1.15 (*)    GFR calc non Af Amer 46 (*)    GFR calc Af Amer 54 (*)    All other components within normal limits  CBC WITH DIFFERENTIAL/PLATELET - Abnormal; Notable for the following:    Hemoglobin 11.8 (*)    All other components within normal limits  PROTIME-INR - Abnormal; Notable for the following:    Prothrombin Time 20.2 (*)    INR 1.72 (*)    All other components within normal limits  URINALYSIS, ROUTINE W REFLEX MICROSCOPIC (NOT AT [facility]) - Abnormal; Notable for the following:    APPearance CLOUDY (*)    Hgb urine dipstick TRACE (*)    Nitrite POSITIVE (*)    Leukocytes, UA LARGE (*)    All other components within normal limits  URINE MICROSCOPIC-ADD ON - Abnormal; Notable for the following:    Squamous Epithelial / LPF FEW (*)    Bacteria, UA MANY (*)    All other components within normal limits  Randolm Idol, ED    Imaging Review Dg Chest 2 View  08/23/2014   CLINICAL DATA:  Hypoglycemia.  Syncope.  EXAM: CHEST  2 VIEW  COMPARISON:  PA and lateral chest 03/01/2012. Single view  of the chest 11/21/2012.  FINDINGS: The lungs are clear. Heart size is upper normal. No pneumothorax or pleural effusion. Surgical clips right hilum are noted. Remote right rib fractures seen.  IMPRESSION: No acute disease.   Electronically Signed   By: Inge Rise M.D.   On: 08/23/2014 15:11   Ct Head Wo Contrast  08/23/2014   CLINICAL DATA:  Syncope today.  Initial encounter.  EXAM: CT HEAD WITHOUT CONTRAST  TECHNIQUE: Contiguous axial images were obtained from the base of the skull through the vertex without intravenous contrast.  COMPARISON:  Head CT scan 11/18/2012.  FINDINGS: Remote left MCA territory infarct is identified. There is atrophy and chronic microvascular ischemic change. No evidence of acute abnormality including hemorrhage, infarct, mass lesion, mass effect, midline shift or abnormal extra-axial fluid collection. No hydrocephalus or pneumocephalus.  IMPRESSION: No acute abnormality.  Atrophy, chronic microvascular ischemic change remote left MCA infarct.   Electronically Signed   By: Inge Rise M.D.   On: 08/23/2014 15:33     EKG Interpretation   Date/Time:  Thursday August 23 2014 14:19:02 EDT Ventricular Rate:  62 PR Interval:  210 QRS Duration: 104 QT Interval:  458 QTC Calculation: 465 R Axis:   4 Text Interpretation:  Sinus rhythm Low voltage, precordial leads  Nonspecific T abnormalities, lateral leads since last tracing no  significant change Confirmed by Eulis Foster  MD, Vira Agar [phone]) on 08/23/2014  3:17:36 PM     Meds given in ED:  Medications - No data to display  New Prescriptions   CEPHALEXIN (KEFLEX) 500 MG CAPSULE    Take 1 capsule (500 mg total) by mouth 4 (four) times daily.   Filed Vitals:   08/23/14 1438 08/23/14 1445 08/23/14 1630  BP: 163/51 173/55 119/63  Pulse: 81 61 57  Temp: 97.5 F (36.4 C)    TempSrc: Oral    Resp: 22 15   Height: 5\' 10"  (1.778 m)    Weight: 210 lb (95.255 kg)    SpO2: 99% 98% 97%    MDM  Vitals stable - WNL  -afebrile Pt resting comfortably in ED. PE--physical exam is grossly benign. Labwork--glucose  99, troponin negative, INR 1.72, evidence of UTI on urinalysis, EKG is not concerning.  Imaging--chest x-ray shows no acute cardiopulmonary pathology, CT head shows no acute abnormality.  DDX--patient syncope likely secondary to hypoglycemia. States she feels well now, is eating and drinking in the ED without difficulty. No evidence of cardiac arrest. I feel she is stable for discharge home and can follow-up with her primary care for further evaluation and management of her symptoms. Antibiotic for UTI. She is leaving with her son and husband.  I discussed all relevant lab findings and imaging results with pt and they verbalized understanding. Discussed f/u with PCP within 48 hrs and return precautions, pt very amenable to plan. Prior to patient discharge, I discussed and reviewed this case with Dr. Eulis Foster, who also saw and evaluated the patient.  Final diagnoses:  Syncope, unspecified syncope type      Comer Locket, PA-C 08/23/14 [place], MD 08/27/14 [phone]

## 2014-08-23 NOTE — ED Provider Notes (Signed)
  Face-to-face evaluation   History: Patient was feeling bad today, and her sugar was borderline low at 70, so she drank some orange juice. After that she had syncope. Her son assisted her to the floor. Then, under guidance from EMS, gave her some chest compressions. This seemed to improve her, but she still remained "groggy. After that EMS arrived and found her blood sugar in the 30s. They treated her with D50, with improvement. Patient feels well now and wants to go home. She has not had a change in her appetite, or medications recently.   Physical exam: Alert, elderly female in no apparent distress. Heart regular rate and rhythm, no murmur. Lungs clear to auscultation. Neurologic- behavior and affect are appropriate. No dysarthria and aphasia or nystagmus.  Medical screening examination/treatment/procedure(s) were conducted as a shared visit with non-physician practitioner(s) and myself.  I personally evaluated the patient during the encounter  Daleen Bo, MD 08/27/14 [phone]

## 2014-08-27 ENCOUNTER — Other Ambulatory Visit: Payer: Self-pay

## 2014-08-27 ENCOUNTER — Telehealth: Payer: Self-pay | Admitting: Internal Medicine

## 2014-08-27 NOTE — Telephone Encounter (Signed)
ER follow up scheduled for 08/28/14 10:30am with Dr. Larose Kells. Pt states she passed out at home and EMS came. Pt was taken to ER but not admitted.

## 2014-08-28 ENCOUNTER — Encounter: Payer: Self-pay | Admitting: Internal Medicine

## 2014-08-28 ENCOUNTER — Ambulatory Visit (INDEPENDENT_AMBULATORY_CARE_PROVIDER_SITE_OTHER): Payer: Medicare Other | Admitting: Internal Medicine

## 2014-08-28 VITALS — BP 132/84 | HR 60 | Temp 97.5°F | Ht 67.0 in | Wt 215.4 lb

## 2014-08-28 DIAGNOSIS — F418 Other specified anxiety disorders: Secondary | ICD-10-CM

## 2014-08-28 DIAGNOSIS — E118 Type 2 diabetes mellitus with unspecified complications: Secondary | ICD-10-CM | POA: Diagnosis not present

## 2014-08-28 DIAGNOSIS — F419 Anxiety disorder, unspecified: Secondary | ICD-10-CM

## 2014-08-28 DIAGNOSIS — F329 Major depressive disorder, single episode, unspecified: Secondary | ICD-10-CM

## 2014-08-28 DIAGNOSIS — F32A Depression, unspecified: Secondary | ICD-10-CM

## 2014-08-28 NOTE — Assessment & Plan Note (Signed)
Diabetes, recently had severe hypoglycemia, it  was a single episode not related to any apparent problem or skipping a meal. Before and after the episodes blood sugar has been pretty good. Plan: Continue present care, watch for hypoglycemia, see endocrinology

## 2014-08-28 NOTE — Progress Notes (Signed)
Subjective:    Patient ID: [name] [name], female    DOB: May 26, 1940, 74 y.o.   MRN: [id number]  DOS:  08/28/2014 Type of visit - description : ER follow-up Interval history: Martin Majestic to the ER 08/27/2014, she was feeling poorly, had orange juice and shortly after she had a syncope. Apparently, she briefly got  chest compressions from his son after he reported no pulse to the EMS dispatcher, she quickly become responsive; then EMS arrived, blood sugar was in the 30s and see receive D50 with improvement. At the emergency department, CT head was negative, UA consistent with UTI, glucose 99, troponins were negative, EKG with no acute changes. She was feeling well and was discharged home  Review of Systems Since she left the hospital, no further symptoms of low blood sugar, CBGs are usually around 115. Question of a UTI, was prescribed Keflex based on urinalysis but no urine culture was  done. She reports no dysuria, gross hematuria difficulty urinating. She continued to have a number of family issues. That quickly become the main pt's complaint Denies chest pain, difficulty breathing. No nausea, vomiting, diarrhea  Past Medical History  Diagnosis Date  . Blindness of left eye   . Eye muscle weakness     Right eye weakness after cataract surgery  . Common migraine     ?hx of  . DIABETES MELLITUS, TYPE II 03/27/2006    dr [name]  . HYPERLIPIDEMIA 03/27/2006  . 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 HYPERTENSION 03/27/2006  . Atrial fibrillation     SVT dx 2007, cath 2007 mild CAD, then had an cardioversion, ablation; still on coumadin , has occ palpitation, EKG 03-2010 NSR  . Headache(784.0) 05/07/2009  . LUNG NODULE 09/01/2006    Excision, Bx Benighn  . Osteopenia 2004    Dexa 2004 showed Osteopenia, DEXA 03/2007 normal  . Seizures   . Intertrochanteric fracture of right hip 07/13/2012  . 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 GERD (gastroesophageal reflux disease) 10/05/2011    Past Surgical  History  Procedure Laterality Date  . Shoulder surgery    . Femur im nail Right 07/15/2012    Procedure: INTRAMEDULLARY (IM) NAIL HIP;  Surgeon: Johnny Bridge, MD;  Location: [place];  Service: Orthopedics;  Laterality: Right;    History   Social History  . Marital Status: Married    Spouse Name: Jenny Reichmann  . Number of Children: 1  . Years of Education: College   Occupational History  . retired     Social History Main Topics  . Smoking status: Former Smoker    Types: Cigarettes    Quit date: 03/02/1978  . Smokeless tobacco: Never Used  . Alcohol Use: No  . Drug Use: No  . Sexual Activity: No   Other Topics Concern  . Not on file   Social History Narrative   Patient lives at home spouse.   Moved from [place] 2004   1 son, problems w/ drugs, at home             Medication List       This list is accurate as of: 08/28/14  5:25 PM.  Always use your most recent med list.               atorvastatin 40 MG tablet  Commonly known as:  LIPITOR  Take 1 tablet (40 mg total) by mouth at bedtime.     calcium-vitamin D 500-200 MG-UNIT per tablet  Commonly known as:  OSCAL  WITH D  Take 1 tablet by mouth daily.     CENTRUM SILVER ADULT 50+ PO  Take 1 tablet by mouth every morning.     diltiazem 240 MG 24 hr capsule  Commonly known as:  DILACOR XR  Take 1 capsule (240 mg total) by mouth daily.     DSS 100 MG Caps  Take 100 mg by mouth 2 (two) times daily.     escitalopram 10 MG tablet  Commonly known as:  LEXAPRO  Take 1 tablet (10 mg total) by mouth daily.     insulin NPH-regular Human (70-30) 100 UNIT/ML injection  Commonly known as:  NOVOLIN 70/30  Inject 15-35 Units into the skin See admin instructions. 35 units with breakfast, 15 units with the evening meal     LORazepam 0.5 MG tablet  Commonly known as:  ATIVAN  TAKE ONE TABLET BY MOUTH TWICE DAILY AS NEEDED FOR ANXIETY     metoprolol tartrate 25 MG tablet  Commonly known as:  LOPRESSOR  Take 0.5 tablets (12.5  mg total) by mouth 2 (two) times daily.     phenytoin 100 MG ER capsule  Commonly known as:  DILANTIN  Take 3 capsules (300 mg total) by mouth daily.     warfarin 2.5 MG tablet  Commonly known as:  COUMADIN  USE AS DIRECTED BY ANTICOAGULATION CLINC           Objective:   Physical Exam BP 132/84 mmHg  Pulse 60  Temp(Src) 97.5 F (36.4 C) (Oral)  Ht 5\' 7"  (1.702 m)  Wt 215 lb 6 oz (97.693 kg)  BMI 33.72 kg/m2  SpO2 96%  General:   Well developed, well nourished . NAD.  HEENT:  Normocephalic . Face symmetric, atraumatic Lungs:  CTA B Normal respiratory effort, no intercostal retractions, no accessory muscle use. Abdomen:  Not distended, soft, non-tender. No rebound or rigidity. No mass,organomegaly Skin: Not pale. Not jaundice Neurologic:  alert & oriented X3.  Speech normal, gait appropriate for age and unassisted Psych--  Cognition and judgment appear intact.  Cooperative with normal attention span and concentration.  Behavior appropriate. No anxious or depressed appearing.      Assessment & Plan:      UTI? Was prescribed Keflex of the emergency room base on a urinalysis, no urine culture was done. Patient is asymptomatic, recommend to discontinue abx   Atrial fibrillation, on Coumadin, last INR slightly low, will let the coumadin  clinic know  Seizure disorder, not taking Dilantin, could not tell me why. Recommend to restart Dilantin.

## 2014-08-28 NOTE — Progress Notes (Signed)
Pre visit review using our clinic review tool, if applicable. No additional management support is needed unless otherwise documented below in the visit note. 

## 2014-08-28 NOTE — Assessment & Plan Note (Signed)
Continue to have problems with her son's behavior, according to the patient her husband is fostering that behavior. Her medication list is incorrected, it showed Lexapro and Celexa, she is supposed to be only on Lexapro. I asked if she needs more medication and she said no "I just need less aggravation". Offered  referral for counseling ---> she declined. Plan: Continue with Lexapro. D/c celexa from med list

## 2014-08-28 NOTE — Patient Instructions (Addendum)
Go back on Dilantin  Take the medications according to the list provided to you today  Always carry  glucose with you and use it if you have any symptoms of low blood sugar  Please make an appointment to see the endocrinologist

## 2014-08-29 ENCOUNTER — Telehealth: Payer: Self-pay | Admitting: *Deleted

## 2014-08-29 NOTE — Telephone Encounter (Signed)
Spoke with pt and instructed had seen results of INR done in ER on June 23rd and instructed to continue same order of coumadin as given on visit of June 21st and to be sure and keep her appt in clinic on July 5th and she states understanding.

## 2014-09-04 ENCOUNTER — Ambulatory Visit (INDEPENDENT_AMBULATORY_CARE_PROVIDER_SITE_OTHER): Payer: Medicare Other | Admitting: *Deleted

## 2014-09-04 DIAGNOSIS — Z7901 Long term (current) use of anticoagulants: Secondary | ICD-10-CM

## 2014-09-04 DIAGNOSIS — Z5181 Encounter for therapeutic drug level monitoring: Secondary | ICD-10-CM | POA: Diagnosis not present

## 2014-09-04 DIAGNOSIS — I4891 Unspecified atrial fibrillation: Secondary | ICD-10-CM

## 2014-09-04 DIAGNOSIS — I4892 Unspecified atrial flutter: Secondary | ICD-10-CM

## 2014-09-04 LAB — POCT INR: INR: 2.1

## 2014-09-11 ENCOUNTER — Other Ambulatory Visit: Payer: Self-pay | Admitting: Endocrinology

## 2014-09-25 ENCOUNTER — Ambulatory Visit (INDEPENDENT_AMBULATORY_CARE_PROVIDER_SITE_OTHER): Payer: Medicare Other | Admitting: *Deleted

## 2014-09-25 DIAGNOSIS — Z7901 Long term (current) use of anticoagulants: Secondary | ICD-10-CM

## 2014-09-25 DIAGNOSIS — I4892 Unspecified atrial flutter: Secondary | ICD-10-CM

## 2014-09-25 DIAGNOSIS — Z5181 Encounter for therapeutic drug level monitoring: Secondary | ICD-10-CM | POA: Diagnosis not present

## 2014-09-25 DIAGNOSIS — I4891 Unspecified atrial fibrillation: Secondary | ICD-10-CM

## 2014-09-25 LAB — POCT INR: INR: 2.1

## 2014-10-17 ENCOUNTER — Ambulatory Visit: Payer: Medicare Other | Admitting: Endocrinology

## 2014-10-23 ENCOUNTER — Ambulatory Visit (INDEPENDENT_AMBULATORY_CARE_PROVIDER_SITE_OTHER): Payer: Medicare Other | Admitting: *Deleted

## 2014-10-23 DIAGNOSIS — Z5181 Encounter for therapeutic drug level monitoring: Secondary | ICD-10-CM | POA: Diagnosis not present

## 2014-10-23 DIAGNOSIS — Z7901 Long term (current) use of anticoagulants: Secondary | ICD-10-CM | POA: Diagnosis not present

## 2014-10-23 DIAGNOSIS — I4891 Unspecified atrial fibrillation: Secondary | ICD-10-CM | POA: Diagnosis not present

## 2014-10-23 DIAGNOSIS — I4892 Unspecified atrial flutter: Secondary | ICD-10-CM

## 2014-10-23 LAB — POCT INR: INR: 2.1

## 2014-10-24 ENCOUNTER — Other Ambulatory Visit (INDEPENDENT_AMBULATORY_CARE_PROVIDER_SITE_OTHER): Payer: Medicare Other | Admitting: *Deleted

## 2014-10-24 ENCOUNTER — Ambulatory Visit (INDEPENDENT_AMBULATORY_CARE_PROVIDER_SITE_OTHER): Payer: Medicare Other | Admitting: Endocrinology

## 2014-10-24 VITALS — BP 120/72 | HR 92 | Temp 97.9°F | Resp 14 | Wt 214.0 lb

## 2014-10-24 DIAGNOSIS — E1040 Type 1 diabetes mellitus with diabetic neuropathy, unspecified: Secondary | ICD-10-CM

## 2014-10-24 DIAGNOSIS — E119 Type 2 diabetes mellitus without complications: Secondary | ICD-10-CM

## 2014-10-24 LAB — POCT GLYCOSYLATED HEMOGLOBIN (HGB A1C): Hemoglobin A1C: 6.8

## 2014-10-25 ENCOUNTER — Encounter: Payer: Self-pay | Admitting: Endocrinology

## 2014-10-26 NOTE — Progress Notes (Signed)
Subjective:    Patient ID: [name] [name], female    DOB: 07-21-1940, 74 y.o.   MRN: [id number]  HPI Pt returns for f/u of diabetes mellitus: DM type: Insulin-requiring type 2 Dx'ed: 0000000 Complications: polyneuropathy and retinopathy. Therapy: insulin since soon after dx.  GDM: never DKA: never Severe hypoglycemia: last episode was mid-2015. Pancreatitis: never.   Other: she is on BID premixed insulin, due to noncompliance with multiple daily injections.  She takes human insulin, due to cost.   Interval history: 2 mos ago, she had another episode of severe hypoglycemia, at noon.  Insulin was reduced to 30 units qam and 15 units qpm.  Since then, cbg's have been well-controlled Past Medical History  Diagnosis Date  . Blindness of left eye   . Eye muscle weakness     Right eye weakness after cataract surgery  . Common migraine     ?hx of  . DIABETES MELLITUS, TYPE II 03/27/2006    dr [name]  . HYPERLIPIDEMIA 03/27/2006  . 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 HYPERTENSION 03/27/2006  . Atrial fibrillation     SVT dx 2007, cath 2007 mild CAD, then had an cardioversion, ablation; still on coumadin , has occ palpitation, EKG 03-2010 NSR  . Headache(784.0) 05/07/2009  . LUNG NODULE 09/01/2006    Excision, Bx Benighn  . Osteopenia 2004    Dexa 2004 showed Osteopenia, DEXA 03/2007 normal  . Seizures   . Intertrochanteric fracture of right hip 07/13/2012  . 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 GERD (gastroesophageal reflux disease) 10/05/2011    Past Surgical History  Procedure Laterality Date  . Shoulder surgery    . Femur im nail Right 07/15/2012    Procedure: INTRAMEDULLARY (IM) NAIL HIP;  Surgeon: Johnny Bridge, MD;  Location: [facility] [facility];  Service: Orthopedics;  Laterality: Right;    Social History   Social History  . Marital Status: Married    Spouse Name: Jenny Reichmann  . Number of Children: 1  . Years of Education: College   Occupational History  . retired     Social History Main  Topics  . Smoking status: Former Smoker    Types: Cigarettes    Quit date: 03/02/1978  . Smokeless tobacco: Never Used  . Alcohol Use: No  . Drug Use: No  . Sexual Activity: No   Other Topics Concern  . Not on file   Social History Narrative   Patient lives at home spouse.   Moved from [place] 2004   1 son, problems w/ drugs, at home         Current Outpatient Prescriptions on File Prior to Visit  Medication Sig Dispense Refill  . atorvastatin (LIPITOR) 40 MG tablet Take 1 tablet (40 mg total) by mouth at bedtime. 30 tablet 8  . calcium-vitamin D (OSCAL WITH D) 500-200 MG-UNIT per tablet Take 1 tablet by mouth daily.    [name] diltiazem (DILACOR XR) 240 MG 24 hr capsule Take 1 capsule (240 mg total) by mouth daily. 30 capsule 8  . docusate sodium 100 MG CAPS Take 100 mg by mouth 2 (two) times daily. 10 capsule 0  . escitalopram (LEXAPRO) 10 MG tablet Take 1 tablet (10 mg total) by mouth daily. 90 tablet 2  . insulin NPH-regular Human (NOVOLIN 70/30) (70-30) 100 UNIT/ML injection Inject 15-35 Units into the skin See admin instructions. 35 units with breakfast, 15 units with the evening meal    . LORazepam (ATIVAN) 0.5 MG tablet TAKE ONE TABLET BY  MOUTH TWICE DAILY AS NEEDED FOR ANXIETY 60 tablet 5  . metoprolol tartrate (LOPRESSOR) 25 MG tablet Take 0.5 tablets (12.5 mg total) by mouth 2 (two) times daily. 90 tablet 2  . Multiple Vitamins-Minerals (CENTRUM SILVER ADULT 50+ PO) Take 1 tablet by mouth every morning.    [name] NOVOLIN 70/30 RELION (70-30) 100 UNIT/ML injection INJECT 50 UNITS SUBCUTANEOUSLY ONCE DAILY WITH BREAKFAST 20 mL 2  . phenytoin (DILANTIN) 100 MG ER capsule Take 3 capsules (300 mg total) by mouth daily. 120 capsule 5  . warfarin (COUMADIN) 2.5 MG tablet USE AS DIRECTED BY ANTICOAGULATION [facility] (Patient taking differently: USE AS DIRECTED BY ANTICOAGULATION CLINC. pt takes 2.5mg  daily) 40 tablet 3   No current facility-administered medications on file prior to visit.     Allergies  Allergen Reactions  . Procaine Hcl Anaphylaxis    Family History  Problem Relation Age of Onset  . Diabetes Father   . Heart attack Father 47  . Diabetes Sister   . Cancer Neg Hx     no hx of colon or breast cancer    BP 120/72 mmHg  Pulse 92  Temp(Src) 97.9 F (36.6 C) (Oral)  Resp 14  Wt 214 lb (97.07 kg)  SpO2 96%  Review of Systems No weight change    Objective:   Physical Exam VITAL SIGNS:  See vs page GENERAL: no distress Pulses: dorsalis pedis intact bilat.   MSK: no deformity of the feet CV: no leg edema Skin:  no ulcer on the feet.  normal color and temp on the feet. Neuro: sensation is intact to touch on the feet   Lab Results  Component Value Date   HGBA1C 6.8 10/24/2014      Assessment & Plan:  DM: improved since episode of severe hypoglycemia. Please continue the same insulin.  Pt is also advised to eat lunch no more than 4 hrs after breakfast. Please come back for a follow-up appointment in 3 months (no avs, due to computer outage)

## 2014-11-21 ENCOUNTER — Ambulatory Visit (INDEPENDENT_AMBULATORY_CARE_PROVIDER_SITE_OTHER): Payer: Medicare Other | Admitting: *Deleted

## 2014-11-21 DIAGNOSIS — Z7901 Long term (current) use of anticoagulants: Secondary | ICD-10-CM

## 2014-11-21 DIAGNOSIS — I4891 Unspecified atrial fibrillation: Secondary | ICD-10-CM

## 2014-11-21 DIAGNOSIS — Z5181 Encounter for therapeutic drug level monitoring: Secondary | ICD-10-CM | POA: Diagnosis not present

## 2014-11-21 DIAGNOSIS — I4892 Unspecified atrial flutter: Secondary | ICD-10-CM

## 2014-11-21 LAB — POCT INR: INR: 1.9

## 2014-11-24 ENCOUNTER — Other Ambulatory Visit: Payer: Self-pay | Admitting: Internal Medicine

## 2014-11-26 ENCOUNTER — Other Ambulatory Visit: Payer: Self-pay | Admitting: *Deleted

## 2014-11-26 MED ORDER — WARFARIN SODIUM 2.5 MG PO TABS: ORAL_TABLET | ORAL | Status: DC

## 2014-11-26 NOTE — Telephone Encounter (Signed)
Rx's printed, awaiting MD signature.  

## 2014-11-26 NOTE — Telephone Encounter (Signed)
Rx faxed to [facility]  

## 2014-11-26 NOTE — Telephone Encounter (Signed)
Okay one-month supply and  5 refill on both

## 2014-11-26 NOTE — Telephone Encounter (Signed)
Pt is requesting refill on Lorazepam and Phenytoin.  Last OV: 08/28/2014 Last Fill on Lorazepam: 05/16/2014 #60 5RF Last Fill on Phenytoin: 03/19/2014 #120 5RF UDS: 11/24/2013 Low risk  Please advise.

## 2014-11-28 ENCOUNTER — Ambulatory Visit (INDEPENDENT_AMBULATORY_CARE_PROVIDER_SITE_OTHER): Payer: Medicare Other | Admitting: Neurology

## 2014-11-28 ENCOUNTER — Encounter: Payer: Self-pay | Admitting: Neurology

## 2014-11-28 VITALS — BP 165/65 | HR 56 | Ht 69.0 in | Wt 217.0 lb

## 2014-11-28 DIAGNOSIS — R569 Unspecified convulsions: Secondary | ICD-10-CM | POA: Diagnosis not present

## 2014-11-28 MED ORDER — ESCITALOPRAM OXALATE 10 MG PO TABS: 20.0000 mg | ORAL_TABLET | Freq: Every day | ORAL | Status: DC

## 2014-11-28 NOTE — Patient Instructions (Signed)
I had a long discussion with the patient and husband regarding her seizures which appear to be stable on the current regimen of Dilantin 300 mg at night. Increase her to be compliant with taking Dilantin regularly. I also recommend she increase the Lexapro to 20 mg daily to improve her mood and to follow-up with her primary physician Dr. Larose Kells for further medication changes for her depression. She was advised to return for follow-up with me in 6 months or call earlier if necessary.

## 2014-11-28 NOTE — Progress Notes (Signed)
PATIENT: [name] [name] DOB: 06-Jun-1940  REASON FOR VISIT: follow up for seizure HISTORY FROM: patient  HISTORY OF PRESENT ILLNESS: Update 07/14/13 (LL):  Since last visit, has struggled with hip pain since surgery, denies falls, ambulates with a cane.  No recurrent seizures, states taking Dilantin 100 ER QID but level last month was only 3.5, she takes 2 in the morning, 1 in the afternoon and 1 at bedtime.  Right hand tremor is stable. MMSE is stable at 26/30.  Update 09/27/12 (LL): Patient comes in for 6 month follow up for seizures. Doing well, had fractured right hip recently from fall in yard, status post surgery 07/15/12. Released from rehab to home 3 weeks ago. Ambulating with cane, doing well. No further seizures since initial episode. She is on Dilantin 150 mg 3 times a day and seems to the tolerating it well without any dizziness, imbalance, diplopia or dysarthria. Thinks that memory is stable, only notable trouble is remembering numbers. Still has mild tremor of the right hand, reports it is better with Ativan. No new complaints.   Update January 21st 2014 (PS): She returns for followup after last visit on 12/11/2011. She continues to do well without any recurrent seizures following the one during her initial hospitalization in March 2013. She is on Dilantin 150 mg 3 times a day and seems to the tolerating it well without any dizziness, imbalance, diplopia or dysarthria. She continues to have mild short-term memory difficulties which are mainly related to her new memory. She can remember remote incidents quite well. She also complains of mild action tremor of the right hand but it has not been disabling.  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 She also has mild diabetic peripheral neuropathy. Overall doing well. One  syncopal episode related to hypoglycemia.  Update 11/14/13 : She returns for followup of her last visit 4 months ago. She continues to do well without recurrent seizures since the initial one in March 2013. She is currently on Vantin 400 mg daily and seems to be covering it very well. She does have mild tremor of the left hand but denies dizziness, diplopia or vertigo sleepiness. She continues to have mild short-term memory difficulties but she seems to be coping quite well with them and these are not progressive. In fact she is still balancing checkbooks and writing checks and not making any mistakes. She has no new complaints today at Update 05/17/2014 : She returns for follow-up after last visit 6 months ago. Patient continues to do well without recurrent seizures. She has tolerated decrease in the Dilantin to 300 mg daily. She has been depressed and upset by her son's activities. He is been arrested for house breaking and is in jail. She has short-term memory difficulties and needs help even with cooking. She has never been tried on medications for depression and she is willing to try treatment. Update 11/28/2014 : She returns for follow-up after last visit 6 months ago accompanied by her husband. She has not had any recurrent seizures and continues to tolerate Dilantin 300 mg at night quite well without significant side effects. She continues to have depression despite being on Lexapro 10 mg and this is situational related to his son's activities is currently in rehabilitation and may have to go to jail. She is tolerating Lexapro well without any side effects. She  does have upcoming visit with her primary physician next month. She has a lot of itching in her hands and is scratching her forearms. and this may be stress related REVIEW OF SYSTEMS: Full 14 system review of systems performed and notable only for:   ,  Speech difficulty, itching, loss of vision , depression and memory loss only and all other  systems negative   ALLERGIES: Allergies  Allergen Reactions  . Procaine Hcl Anaphylaxis    HOME MEDICATIONS: Outpatient Prescriptions Prior to Visit  Medication Sig Dispense Refill  . atorvastatin (LIPITOR) 40 MG tablet Take 1 tablet (40 mg total) by mouth at bedtime. 30 tablet 8  . calcium-vitamin D (OSCAL WITH D) 500-200 MG-UNIT per tablet Take 1 tablet by mouth daily.    [name] diltiazem (DILACOR XR) 240 MG 24 hr capsule Take 1 capsule (240 mg total) by mouth daily. 30 capsule 8  . metoprolol tartrate (LOPRESSOR) 25 MG tablet Take 0.5 tablets (12.5 mg total) by mouth 2 (two) times daily. 90 tablet 2  . Multiple Vitamins-Minerals (CENTRUM SILVER ADULT 50+ PO) Take 1 tablet by mouth every morning.    [name] NOVOLIN 70/30 RELION (70-30) 100 UNIT/ML injection INJECT 50 UNITS SUBCUTANEOUSLY ONCE DAILY WITH BREAKFAST 20 mL 2  . phenytoin (DILANTIN) 100 MG ER capsule Take 3 capsules (300 mg total) by mouth daily. 90 capsule 5  . warfarin (COUMADIN) 2.5 MG tablet USE AS DIRECTED BY ANTICOAGULATION CLINC 40 tablet 3  . escitalopram (LEXAPRO) 10 MG tablet Take 1 tablet (10 mg total) by mouth daily. 90 tablet 2  . docusate sodium 100 MG CAPS Take 100 mg by mouth 2 (two) times daily. (Patient not taking: Reported on 11/28/2014) 10 capsule 0  . insulin NPH-regular Human (NOVOLIN 70/30) (70-30) 100 UNIT/ML injection 30 units with breakfast, 15 units with the evening meal    . LORazepam (ATIVAN) 0.5 MG tablet Take 1 tablet (0.5 mg total) by mouth 2 (two) times daily as needed for anxiety. (Patient not taking: Reported on 11/28/2014) 60 tablet 5   No facility-administered medications prior to visit.     PHYSICAL EXAM  Filed Vitals:   11/28/14 1550  BP: 165/65  Pulse: 56  Height: 5\' 9"  (1.753 m)  Weight: 217 lb (98.431 kg)   Body mass index is 32.03 kg/(m^2). No exam data present   Generalized: In no acute distress, very pleasant elderly Caucasian female  Neck: Supple, no carotid bruits  Cardiac:  Regular rate rhythm, no murmur  Pulmonary: Clear to auscultation bilaterally  Musculoskeletal: No deformity  SKIN : Multiple scratches bilateral forearms and hand  Neurological examination  Mentation: Alert oriented to time, place, history taking, language fluent, and casual conversation. MMSE 24/30 today. Diminished recall 1/3. Diminished orientation. Animal fluency test 6 only. Clock drawing 3/4. Geriatric depression scale 9 suggestive of moderate depression Cranial nerve II-XII: Pupils were equal round reactive to light extraocular movements were full,  legally blind left eye   Facial sensation and strength were normal. hearing was intact to finger rubbing bilaterally. Uvula tongue midline. head turning and shoulder shrug and were normal and symmetric.Tongue protrusion into cheek strength was normal.  MOTOR: normal bulk and tone, full strength in the BUE, decreased strength 4/5 hip flexion RLE (due to hip surgery) , LLE 5/5 strength. fine finger movements normal, no pronator drift. Fine action and resting tremor of right hand. No cogwheel rigidity or bradykinesia.  SENSORY: normal and symmetric to light touch Vibration and position impaired over toes.  COORDINATION: finger-nose-finger, heel-to-shin bilaterally, there was no truncal ataxia  REFLEXES: Brachioradialis 2/2, biceps 2/2, triceps 2/2, patellar 1/1, Achilles trace/trace  GAIT/STATION: Rising up from seated position without assistance, wide stance, without trunk ataxia, slow but steady athralgic gait with favoring of the right hip. Unable to perform tiptoe, heel walking or tandem.   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 She also has mild diabetic peripheral neuropathy. Overall doing well. She also has underlying situational depression which is suboptimally  controlled on present dose of Lexapro  PLAN :I had a long discussion with the patient and husband regarding her seizures which appear to be stable on the current regimen of Dilantin 300 mg at night. Increase her to be compliant with taking Dilantin regularly. I also recommend she increase the Lexapro to 20 mg daily to improve her mood and to follow-up with her primary physician Dr. Larose Kells for further medication changes for her depression. Greater than 50% time during this 25 minute visit was spent on counseling and coordination of care. She was advised to return for follow-up with me in 6 months or call earlier if necessary.    Meds ordered this encounter  Medications  . citalopram (CELEXA) 10 MG tablet    Sig: Take 10 mg by mouth daily.  [name] escitalopram (LEXAPRO) 10 MG tablet    Sig: Take 2 tablets (20 mg total) by mouth daily.    Dispense:  90 tablet    Refill:  2    D/C PREVIOUS SCRIPTS FOR THIS MEDICATION   Return in about 6 months (around 05/28/2015).  Antony Contras, MD  11/28/2014, 4:19 PM [facility] [address] [place], [place] [address] [phone]  Note: This document was prepared with digital dictation and possible smart phrase technology. Any transcriptional errors that result from this process are unintentional.

## 2014-12-04 ENCOUNTER — Ambulatory Visit (INDEPENDENT_AMBULATORY_CARE_PROVIDER_SITE_OTHER): Payer: Medicare Other

## 2014-12-04 DIAGNOSIS — Z23 Encounter for immunization: Secondary | ICD-10-CM | POA: Diagnosis not present

## 2014-12-04 NOTE — Progress Notes (Signed)
Pre visit review using our clinic review tool, if applicable. No additional management support is needed unless otherwise documented below in the visit note. 

## 2014-12-04 NOTE — Progress Notes (Signed)
Pt tolerated injection well

## 2014-12-19 ENCOUNTER — Ambulatory Visit (INDEPENDENT_AMBULATORY_CARE_PROVIDER_SITE_OTHER): Payer: Medicare Other | Admitting: *Deleted

## 2014-12-19 DIAGNOSIS — I4891 Unspecified atrial fibrillation: Secondary | ICD-10-CM

## 2014-12-19 DIAGNOSIS — Z7901 Long term (current) use of anticoagulants: Secondary | ICD-10-CM

## 2014-12-19 DIAGNOSIS — I4892 Unspecified atrial flutter: Secondary | ICD-10-CM

## 2014-12-19 DIAGNOSIS — Z5181 Encounter for therapeutic drug level monitoring: Secondary | ICD-10-CM | POA: Diagnosis not present

## 2014-12-19 LAB — POCT INR: INR: 1.8

## 2014-12-28 ENCOUNTER — Encounter: Payer: Self-pay | Admitting: Internal Medicine

## 2014-12-28 ENCOUNTER — Ambulatory Visit (INDEPENDENT_AMBULATORY_CARE_PROVIDER_SITE_OTHER): Payer: Medicare Other | Admitting: Internal Medicine

## 2014-12-28 VITALS — BP 118/74 | HR 56 | Temp 97.8°F | Ht 69.0 in | Wt 218.1 lb

## 2014-12-28 DIAGNOSIS — Z794 Long term (current) use of insulin: Secondary | ICD-10-CM

## 2014-12-28 DIAGNOSIS — Z09 Encounter for follow-up examination after completed treatment for conditions other than malignant neoplasm: Secondary | ICD-10-CM | POA: Insufficient documentation

## 2014-12-28 DIAGNOSIS — I10 Essential (primary) hypertension: Secondary | ICD-10-CM

## 2014-12-28 DIAGNOSIS — D649 Anemia, unspecified: Secondary | ICD-10-CM

## 2014-12-28 DIAGNOSIS — R21 Rash and other nonspecific skin eruption: Secondary | ICD-10-CM

## 2014-12-28 DIAGNOSIS — E118 Type 2 diabetes mellitus with unspecified complications: Secondary | ICD-10-CM | POA: Diagnosis not present

## 2014-12-28 DIAGNOSIS — R82998 Other abnormal findings in urine: Secondary | ICD-10-CM

## 2014-12-28 DIAGNOSIS — Z23 Encounter for immunization: Secondary | ICD-10-CM | POA: Diagnosis not present

## 2014-12-28 DIAGNOSIS — N390 Urinary tract infection, site not specified: Secondary | ICD-10-CM

## 2014-12-28 LAB — CBC WITH DIFFERENTIAL/PLATELET
BASOS ABS: 0 10*3/uL (ref 0.0–0.1)
Basophils Relative: 0 % (ref 0–1)
EOS PCT: 3 % (ref 0–5)
Eosinophils Absolute: 0.2 10*3/uL (ref 0.0–0.7)
HEMATOCRIT: 33.1 % — AB (ref 36.0–46.0)
HEMOGLOBIN: 11 g/dL — AB (ref 12.0–15.0)
LYMPHS ABS: 2.4 10*3/uL (ref 0.7–4.0)
LYMPHS PCT: 31 % (ref 12–46)
MCH: 30.1 pg (ref 26.0–34.0)
MCHC: 33.2 g/dL (ref 30.0–36.0)
MCV: 90.7 fL (ref 78.0–100.0)
MPV: 10.1 fL (ref 8.6–12.4)
Monocytes Absolute: 0.9 10*3/uL (ref 0.1–1.0)
Monocytes Relative: 12 % (ref 3–12)
Neutro Abs: 4.1 10*3/uL (ref 1.7–7.7)
Neutrophils Relative %: 54 % (ref 43–77)
Platelets: 360 10*3/uL (ref 150–400)
RBC: 3.65 MIL/uL — ABNORMAL LOW (ref 3.87–5.11)
RDW: 12.8 % (ref 11.5–15.5)
WBC: 7.6 10*3/uL (ref 4.0–10.5)

## 2014-12-28 LAB — BASIC METABOLIC PANEL
BUN: 20 mg/dL (ref 7–25)
CHLORIDE: 102 mmol/L (ref 98–110)
CO2: 27 mmol/L (ref 20–31)
CREATININE: 1.08 mg/dL — AB (ref 0.60–0.93)
Calcium: 8.8 mg/dL (ref 8.6–10.4)
GLUCOSE: 156 mg/dL — AB (ref 65–99)
Potassium: 4.7 mmol/L (ref 3.5–5.3)
Sodium: 138 mmol/L (ref 135–146)

## 2014-12-28 MED ORDER — BETAMETHASONE DIPROPIONATE AUG 0.05 % EX CREA: TOPICAL_CREAM | Freq: Two times a day (BID) | CUTANEOUS | Status: DC

## 2014-12-28 NOTE — Assessment & Plan Note (Signed)
HTN: Seems well-controlled, check a BMP Mild anemia noted when I review the labs, check a CBC.   Anxiety: Better, less stressed lately. She is taking both citalopram and escitalopram. Recommend to clarify. See instructions. Also will sign a contract needs a UDS. Addendum: We agreed she will discontinue citalopram. Rash: Does not seem to be a infection. Will recommend a high potency steroid for few days. Will call if no better Primary care: prevnar today RTC 6 months.

## 2014-12-28 NOTE — Progress Notes (Signed)
Pre visit review using our clinic review tool, if applicable. No additional management support is needed unless otherwise documented below in the visit note. 

## 2014-12-28 NOTE — Patient Instructions (Signed)
Get your blood work before you leave       Next visit  for a  routine checkup in 6 months. Fasting.  (30 minutes) Please schedule an appointment at the front desk  Apparently you are taking citalopram and escitalopram. You really need to take either one or the other. Please let me know which one you prefer.

## 2014-12-28 NOTE — Progress Notes (Signed)
Subjective:    Patient ID: [name] [name], female    DOB: [birth year], 74 y.o.   MRN: [id number]  DOS:  12/28/2014 Type of visit - description : Routine visit, here with her husband Interval history: Anxiety: Much improved, decrease  stress as her son is now in rehabilitation. Is taking both citalopram and escitalopram apparently. Has developed skin lesions again at the right arm, from the elbow down, itchy, not painful HTN: Well-controlled. Good compliance with medications    Review of Systems  Denies fever chills No chest pain, difficulty breathing or lower extremity edema.  Past Medical History  Diagnosis Date  . Blindness of left eye   . Eye muscle weakness     Right eye weakness after cataract surgery  . Common migraine     ?hx of  . DIABETES MELLITUS, TYPE II 03/27/2006    dr [name]  . HYPERLIPIDEMIA 03/27/2006  . 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 HYPERTENSION 03/27/2006  . Atrial fibrillation ([place])     SVT dx 2007, cath 2007 mild CAD, then had an cardioversion, ablation; still on coumadin , has occ palpitation, EKG 03-2010 NSR  . Headache(784.0) 05/07/2009  . LUNG NODULE 09/01/2006    Excision, Bx [facility]  . Osteopenia 2004    Dexa 2004 showed Osteopenia, DEXA 03/2007 normal  . Seizures ([place])   . Intertrochanteric fracture of right hip ([place]) 07/13/2012  . 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 GERD (gastroesophageal reflux disease) 10/05/2011    Past Surgical History  Procedure Laterality Date  . Shoulder surgery    . Femur im nail Right 07/15/2012    Procedure: INTRAMEDULLARY (IM) NAIL HIP;  Surgeon: Johnny Bridge, MD;  Location: [facility];  Service: Orthopedics;  Laterality: Right;    Social History   Social History  . Marital Status: Married    Spouse Name: Jenny Reichmann  . Number of Children: 1  . Years of Education: College   Occupational History  . retired     Social History Main Topics  . Smoking status: Former Smoker    Types: Cigarettes    Quit date:  03/02/1978  . Smokeless tobacco: Never Used  . Alcohol Use: No  . Drug Use: No  . Sexual Activity: No   Other Topics Concern  . Not on file   Social History Narrative   Patient lives at home spouse.   Moved from [place] 2004   1 son, problems w/ drugs, at home             Medication List       This list is accurate as of: 12/28/14  5:19 PM.  Always use your most recent med list.               atorvastatin 40 MG tablet  Commonly known as:  LIPITOR  Take 1 tablet (40 mg total) by mouth at bedtime.     augmented betamethasone dipropionate 0.05 % cream  Commonly known as:  DIPROLENE-AF  Apply topically 2 (two) times daily.     calcium-vitamin D 500-200 MG-UNIT tablet  Commonly known as:  OSCAL WITH D  Take 1 tablet by mouth daily.     CENTRUM SILVER ADULT 50+ PO  Take 1 tablet by mouth every morning.     diltiazem 240 MG 24 hr capsule  Commonly known as:  DILACOR XR  Take 1 capsule (240 mg total) by mouth daily.     escitalopram 10 MG tablet  Commonly known as:  LEXAPRO  Take 2 tablets (20 mg total) by mouth daily.     LORazepam 0.5 MG tablet  Commonly known as:  ATIVAN  Take 0.5 mg by mouth as needed for anxiety.     metoprolol tartrate 25 MG tablet  Commonly known as:  LOPRESSOR  Take 0.5 tablets (12.5 mg total) by mouth 2 (two) times daily.     NOVOLIN 70/30 RELION (70-30) 100 UNIT/ML injection  Generic drug:  insulin NPH-regular Human  INJECT 50 UNITS SUBCUTANEOUSLY ONCE DAILY WITH BREAKFAST     phenytoin 100 MG ER capsule  Commonly known as:  DILANTIN  Take 3 capsules (300 mg total) by mouth daily.     warfarin 2.5 MG tablet  Commonly known as:  COUMADIN  USE AS DIRECTED BY ANTICOAGULATION CLINC           Objective:   Physical Exam  Musculoskeletal:       Arms:  BP 118/74 mmHg  Pulse 56  Temp(Src) 97.8 F (36.6 C) (Oral)  Ht 5\' 9"  (1.753 m)  Wt 218 lb 2 oz (98.941 kg)  BMI 32.20 kg/m2  SpO2 96% General:   Well developed, well  nourished . NAD.  HEENT:  Normocephalic . Face symmetric, atraumatic Lungs:  CTA B Normal respiratory effort, no intercostal retractions, no accessory muscle use. Heart: Irregular,  no murmur.  No pretibial edema bilaterally  Neurologic:  alert & oriented X3.  Speech normal, gait appropriate for age and unassisted Psych--  Cognition and judgment appear intact.  Cooperative with normal attention span and concentration.  Behavior appropriate. No anxious or depressed appearing.      Assessment & Plan:   Assessment > DM  + retinopathy, Dr [name] HTN Hyperlipidemia GERD Atrial fibrillation, SVT -- dx 2007, cath 2007 mild CAD. S/p cardioversion, then ablation, on coumadin, last visit cards 2014  Osteopenia Herpes encephalitis 2014 Seizures (after encephalitis) Blindness, left eye Right eye weakness after cataract surgery Headaches Osteopenia per DEXA 2004, DEXA 2009 normal H/o lung bx 2008 benign   Plan  HTN: Seems well-controlled, check a BMP Mild anemia noted when I review the labs, check a CBC.   Anxiety: Better, less stressed lately. She is taking both citalopram and escitalopram. Recommend to clarify. See instructions. Also will sign a contract needs a UDS. Addendum: We agreed she will discontinue citalopram. Rash: Does not seem to be a infection. Will recommend a high potency steroid for few days. Will call if no better Primary care: prevnar today RTC 6 months.

## 2014-12-29 LAB — URINALYSIS, ROUTINE W REFLEX MICROSCOPIC
BILIRUBIN URINE: NEGATIVE
GLUCOSE, UA: NEGATIVE
Ketones, ur: NEGATIVE
Nitrite: NEGATIVE
Protein, ur: NEGATIVE
Specific Gravity, Urine: 1.013 (ref 1.001–1.035)
pH: 5.5 (ref 5.0–8.0)

## 2014-12-29 LAB — URINALYSIS, MICROSCOPIC ONLY
CRYSTALS: NONE SEEN [HPF]
Casts: NONE SEEN [LPF]
RBC / HPF: NONE SEEN RBC/HPF (ref <=2)
Squamous Epithelial / LPF: NONE SEEN [HPF] (ref <=5)
WBC, UA: >60 WBC/HPF — AB (ref <=5)
Yeast: NONE SEEN [HPF]

## 2015-01-01 NOTE — Addendum Note (Signed)
Addended by: Wilfrid Lund on: 01/01/2015 04:02 PM   Modules accepted: Orders

## 2015-01-02 ENCOUNTER — Ambulatory Visit (INDEPENDENT_AMBULATORY_CARE_PROVIDER_SITE_OTHER): Payer: Medicare Other | Admitting: *Deleted

## 2015-01-02 DIAGNOSIS — I4892 Unspecified atrial flutter: Secondary | ICD-10-CM | POA: Diagnosis not present

## 2015-01-02 DIAGNOSIS — Z5181 Encounter for therapeutic drug level monitoring: Secondary | ICD-10-CM

## 2015-01-02 DIAGNOSIS — I4891 Unspecified atrial fibrillation: Secondary | ICD-10-CM

## 2015-01-02 DIAGNOSIS — Z7901 Long term (current) use of anticoagulants: Secondary | ICD-10-CM | POA: Diagnosis not present

## 2015-01-02 LAB — POCT INR: INR: 1.4

## 2015-01-09 ENCOUNTER — Ambulatory Visit (INDEPENDENT_AMBULATORY_CARE_PROVIDER_SITE_OTHER): Payer: Medicare Other | Admitting: *Deleted

## 2015-01-09 DIAGNOSIS — Z5181 Encounter for therapeutic drug level monitoring: Secondary | ICD-10-CM | POA: Diagnosis not present

## 2015-01-09 DIAGNOSIS — Z7901 Long term (current) use of anticoagulants: Secondary | ICD-10-CM

## 2015-01-09 DIAGNOSIS — I4891 Unspecified atrial fibrillation: Secondary | ICD-10-CM | POA: Diagnosis not present

## 2015-01-09 DIAGNOSIS — I4892 Unspecified atrial flutter: Secondary | ICD-10-CM | POA: Diagnosis not present

## 2015-01-09 LAB — POCT INR: INR: 1.3

## 2015-01-14 ENCOUNTER — Telehealth: Payer: Self-pay | Admitting: Internal Medicine

## 2015-01-14 NOTE — Telephone Encounter (Signed)
Caller name: Self  Can be reached: [phone]  Reason for call: Patient states she received call stating she needed a referral but does not know who the referral is too. Plse Adv.

## 2015-01-14 NOTE — Telephone Encounter (Signed)
Notes Recorded by Wilfrid Lund, CMA on 01/01/2015 at 4:02 PM Results released to MyChart, informing Pt to expect call from urology regarding urine results. Instructed her to call if any questions or concerns. Notes Recorded by Wilfrid Lund, CMA on 01/01/2015 at 3:59 PM Add on faxed to main lab. Notes Recorded by Colon Branch, MD on 01/01/2015 at 3:55 PM Advise patient: She continue with white blood cells in the urine, previously without apparent infection. She continue with mild but stable anemia. Plan: Please arrange a urology referral, DX sterile pyuria. Please add or redraw an iron, ferritin-DX anemia

## 2015-01-16 ENCOUNTER — Telehealth: Payer: Self-pay | Admitting: Internal Medicine

## 2015-01-16 NOTE — Telephone Encounter (Signed)
error:[id number] ° °

## 2015-01-18 ENCOUNTER — Ambulatory Visit (INDEPENDENT_AMBULATORY_CARE_PROVIDER_SITE_OTHER): Payer: Medicare Other | Admitting: *Deleted

## 2015-01-18 DIAGNOSIS — Z5181 Encounter for therapeutic drug level monitoring: Secondary | ICD-10-CM

## 2015-01-18 DIAGNOSIS — Z7901 Long term (current) use of anticoagulants: Secondary | ICD-10-CM | POA: Diagnosis not present

## 2015-01-18 DIAGNOSIS — I4891 Unspecified atrial fibrillation: Secondary | ICD-10-CM

## 2015-01-18 DIAGNOSIS — I4892 Unspecified atrial flutter: Secondary | ICD-10-CM | POA: Diagnosis not present

## 2015-01-18 LAB — POCT INR: INR: 1.8

## 2015-01-28 ENCOUNTER — Other Ambulatory Visit: Payer: Self-pay | Admitting: Endocrinology

## 2015-01-29 ENCOUNTER — Ambulatory Visit (INDEPENDENT_AMBULATORY_CARE_PROVIDER_SITE_OTHER): Payer: Medicare Other | Admitting: *Deleted

## 2015-01-29 ENCOUNTER — Telehealth: Payer: Self-pay | Admitting: Endocrinology

## 2015-01-29 DIAGNOSIS — Z7901 Long term (current) use of anticoagulants: Secondary | ICD-10-CM | POA: Diagnosis not present

## 2015-01-29 DIAGNOSIS — Z5181 Encounter for therapeutic drug level monitoring: Secondary | ICD-10-CM | POA: Diagnosis not present

## 2015-01-29 DIAGNOSIS — I4891 Unspecified atrial fibrillation: Secondary | ICD-10-CM

## 2015-01-29 DIAGNOSIS — I4892 Unspecified atrial flutter: Secondary | ICD-10-CM

## 2015-01-29 LAB — POCT INR: INR: 1.9

## 2015-01-29 NOTE — Telephone Encounter (Signed)
Patient need refill of NOVOLIN 70/30 RELION (70-30) 100 UNIT/ML injection, send to [facility] [place], [place] - [address] [phone] (Phone) [phone] (Fax)

## 2015-01-29 NOTE — Telephone Encounter (Signed)
Rx sent via e-script 01/29/2015.

## 2015-01-31 ENCOUNTER — Other Ambulatory Visit: Payer: Self-pay | Admitting: Internal Medicine

## 2015-02-04 ENCOUNTER — Telehealth: Payer: Self-pay | Admitting: Internal Medicine

## 2015-02-04 NOTE — Telephone Encounter (Signed)
Caller name: Jenny Reichmann  Relationship to patient: Husband  Can be reached: [phone]   Reason for call: Husband wants to know who the Oncologist is that patient is suppose to see. States someone was suppose to call her.

## 2015-02-05 NOTE — Telephone Encounter (Signed)
Correction:  Referral was placed for patient to see a urologist and not oncologist.  Per husband, pt has an appt with [facility] on 02/22/15.  No further needs at this time.

## 2015-02-12 ENCOUNTER — Ambulatory Visit (INDEPENDENT_AMBULATORY_CARE_PROVIDER_SITE_OTHER): Payer: Medicare Other | Admitting: *Deleted

## 2015-02-12 DIAGNOSIS — I4891 Unspecified atrial fibrillation: Secondary | ICD-10-CM | POA: Diagnosis not present

## 2015-02-12 DIAGNOSIS — Z7901 Long term (current) use of anticoagulants: Secondary | ICD-10-CM

## 2015-02-12 DIAGNOSIS — I4892 Unspecified atrial flutter: Secondary | ICD-10-CM

## 2015-02-12 DIAGNOSIS — Z5181 Encounter for therapeutic drug level monitoring: Secondary | ICD-10-CM

## 2015-02-12 LAB — POCT INR: INR: 2.5

## 2015-03-07 ENCOUNTER — Ambulatory Visit (INDEPENDENT_AMBULATORY_CARE_PROVIDER_SITE_OTHER): Payer: Medicare Other | Admitting: *Deleted

## 2015-03-07 DIAGNOSIS — Z5181 Encounter for therapeutic drug level monitoring: Secondary | ICD-10-CM | POA: Diagnosis not present

## 2015-03-07 DIAGNOSIS — I4891 Unspecified atrial fibrillation: Secondary | ICD-10-CM

## 2015-03-07 DIAGNOSIS — I4892 Unspecified atrial flutter: Secondary | ICD-10-CM | POA: Diagnosis not present

## 2015-03-07 DIAGNOSIS — Z7901 Long term (current) use of anticoagulants: Secondary | ICD-10-CM | POA: Diagnosis not present

## 2015-03-07 LAB — POCT INR: INR: 2.5

## 2015-03-10 ENCOUNTER — Other Ambulatory Visit: Payer: Self-pay | Admitting: Urology

## 2015-04-05 ENCOUNTER — Ambulatory Visit (INDEPENDENT_AMBULATORY_CARE_PROVIDER_SITE_OTHER): Payer: Medicare Other | Admitting: *Deleted

## 2015-04-05 DIAGNOSIS — I4891 Unspecified atrial fibrillation: Secondary | ICD-10-CM

## 2015-04-05 DIAGNOSIS — I4892 Unspecified atrial flutter: Secondary | ICD-10-CM | POA: Diagnosis not present

## 2015-04-05 DIAGNOSIS — Z5181 Encounter for therapeutic drug level monitoring: Secondary | ICD-10-CM

## 2015-04-05 DIAGNOSIS — Z7901 Long term (current) use of anticoagulants: Secondary | ICD-10-CM

## 2015-04-05 LAB — POCT INR: INR: 2

## 2015-05-03 ENCOUNTER — Other Ambulatory Visit: Payer: Self-pay | Admitting: Internal Medicine

## 2015-05-03 ENCOUNTER — Ambulatory Visit (INDEPENDENT_AMBULATORY_CARE_PROVIDER_SITE_OTHER): Payer: Medicare Other | Admitting: *Deleted

## 2015-05-03 DIAGNOSIS — Z7901 Long term (current) use of anticoagulants: Secondary | ICD-10-CM | POA: Diagnosis not present

## 2015-05-03 DIAGNOSIS — I4892 Unspecified atrial flutter: Secondary | ICD-10-CM | POA: Diagnosis not present

## 2015-05-03 DIAGNOSIS — I4891 Unspecified atrial fibrillation: Secondary | ICD-10-CM

## 2015-05-03 DIAGNOSIS — Z5181 Encounter for therapeutic drug level monitoring: Secondary | ICD-10-CM | POA: Diagnosis not present

## 2015-05-03 LAB — POCT INR: INR: 1.5

## 2015-05-06 ENCOUNTER — Emergency Department (HOSPITAL_COMMUNITY): Payer: Medicare Other

## 2015-05-06 ENCOUNTER — Ambulatory Visit: Payer: Medicare Other | Admitting: Family Medicine

## 2015-05-06 ENCOUNTER — Inpatient Hospital Stay (HOSPITAL_COMMUNITY)
Admission: EM | Admit: 2015-05-06 | Discharge: 2015-05-11 | DRG: 872 | Disposition: A | Discharge status: Home Health | Payer: Medicare Other | Attending: Internal Medicine | Admitting: Internal Medicine

## 2015-05-06 ENCOUNTER — Encounter (HOSPITAL_COMMUNITY): Payer: Self-pay | Admitting: Emergency Medicine

## 2015-05-06 DIAGNOSIS — R112 Nausea with vomiting, unspecified: Secondary | ICD-10-CM

## 2015-05-06 DIAGNOSIS — E1149 Type 2 diabetes mellitus with other diabetic neurological complication: Secondary | ICD-10-CM | POA: Diagnosis present

## 2015-05-06 DIAGNOSIS — G40909 Epilepsy, unspecified, not intractable, without status epilepticus: Secondary | ICD-10-CM | POA: Diagnosis present

## 2015-05-06 DIAGNOSIS — E860 Dehydration: Secondary | ICD-10-CM | POA: Diagnosis present

## 2015-05-06 DIAGNOSIS — N3080 Other cystitis without hematuria: Secondary | ICD-10-CM | POA: Diagnosis present

## 2015-05-06 DIAGNOSIS — Z87891 Personal history of nicotine dependence: Secondary | ICD-10-CM

## 2015-05-06 DIAGNOSIS — N10 Acute tubulo-interstitial nephritis: Secondary | ICD-10-CM | POA: Diagnosis present

## 2015-05-06 DIAGNOSIS — I5020 Unspecified systolic (congestive) heart failure: Secondary | ICD-10-CM | POA: Diagnosis present

## 2015-05-06 DIAGNOSIS — N182 Chronic kidney disease, stage 2 (mild): Secondary | ICD-10-CM | POA: Diagnosis present

## 2015-05-06 DIAGNOSIS — I248 Other forms of acute ischemic heart disease: Secondary | ICD-10-CM | POA: Diagnosis present

## 2015-05-06 DIAGNOSIS — Z794 Long term (current) use of insulin: Secondary | ICD-10-CM | POA: Diagnosis not present

## 2015-05-06 DIAGNOSIS — E785 Hyperlipidemia, unspecified: Secondary | ICD-10-CM | POA: Diagnosis present

## 2015-05-06 DIAGNOSIS — N189 Chronic kidney disease, unspecified: Secondary | ICD-10-CM

## 2015-05-06 DIAGNOSIS — I130 Hypertensive heart and chronic kidney disease with heart failure and stage 1 through stage 4 chronic kidney disease, or unspecified chronic kidney disease: Secondary | ICD-10-CM | POA: Diagnosis present

## 2015-05-06 DIAGNOSIS — E1165 Type 2 diabetes mellitus with hyperglycemia: Secondary | ICD-10-CM | POA: Diagnosis present

## 2015-05-06 DIAGNOSIS — A4151 Sepsis due to Escherichia coli [E. coli]: Secondary | ICD-10-CM | POA: Diagnosis present

## 2015-05-06 DIAGNOSIS — K219 Gastro-esophageal reflux disease without esophagitis: Secondary | ICD-10-CM | POA: Diagnosis present

## 2015-05-06 DIAGNOSIS — E876 Hypokalemia: Secondary | ICD-10-CM | POA: Diagnosis present

## 2015-05-06 DIAGNOSIS — R197 Diarrhea, unspecified: Secondary | ICD-10-CM

## 2015-05-06 DIAGNOSIS — D72829 Elevated white blood cell count, unspecified: Secondary | ICD-10-CM | POA: Diagnosis not present

## 2015-05-06 DIAGNOSIS — N12 Tubulo-interstitial nephritis, not specified as acute or chronic: Secondary | ICD-10-CM

## 2015-05-06 DIAGNOSIS — Z79899 Other long term (current) drug therapy: Secondary | ICD-10-CM | POA: Diagnosis not present

## 2015-05-06 DIAGNOSIS — H5442 Blindness, left eye, normal vision right eye: Secondary | ICD-10-CM | POA: Diagnosis present

## 2015-05-06 DIAGNOSIS — I4891 Unspecified atrial fibrillation: Secondary | ICD-10-CM | POA: Diagnosis present

## 2015-05-06 DIAGNOSIS — E1122 Type 2 diabetes mellitus with diabetic chronic kidney disease: Secondary | ICD-10-CM | POA: Diagnosis present

## 2015-05-06 DIAGNOSIS — A419 Sepsis, unspecified organism: Secondary | ICD-10-CM

## 2015-05-06 DIAGNOSIS — Z7901 Long term (current) use of anticoagulants: Secondary | ICD-10-CM

## 2015-05-06 DIAGNOSIS — N179 Acute kidney failure, unspecified: Secondary | ICD-10-CM | POA: Diagnosis present

## 2015-05-06 DIAGNOSIS — I10 Essential (primary) hypertension: Secondary | ICD-10-CM | POA: Diagnosis not present

## 2015-05-06 HISTORY — DX: Unspecified systolic (congestive) heart failure: I50.20

## 2015-05-06 LAB — COMPREHENSIVE METABOLIC PANEL WITH GFR
ALT: 12 U/L — ABNORMAL LOW (ref 14–54)
AST: 22 U/L (ref 15–41)
Albumin: 2.8 g/dL — ABNORMAL LOW (ref 3.5–5.0)
Alkaline Phosphatase: 120 U/L (ref 38–126)
Anion gap: 13 (ref 5–15)
BUN: 18 mg/dL (ref 6–20)
CO2: 25 mmol/L (ref 22–32)
Calcium: 8.4 mg/dL — ABNORMAL LOW (ref 8.9–10.3)
Chloride: 100 mmol/L — ABNORMAL LOW (ref 101–111)
Creatinine, Ser: 1.5 mg/dL — ABNORMAL HIGH (ref 0.44–1.00)
GFR calc Af Amer: 39 mL/min — ABNORMAL LOW
GFR calc non Af Amer: 33 mL/min — ABNORMAL LOW
Glucose, Bld: 346 mg/dL — ABNORMAL HIGH (ref 65–99)
Potassium: 4.3 mmol/L (ref 3.5–5.1)
Sodium: 138 mmol/L (ref 135–145)
Total Bilirubin: 0.7 mg/dL (ref 0.3–1.2)
Total Protein: 6.5 g/dL (ref 6.5–8.1)

## 2015-05-06 LAB — URINALYSIS, ROUTINE W REFLEX MICROSCOPIC
Bilirubin Urine: NEGATIVE
GLUCOSE, UA: 500 mg/dL — AB
Ketones, ur: 15 mg/dL — AB
NITRITE: NEGATIVE
PH: 5 (ref 5.0–8.0)
PROTEIN: 100 mg/dL — AB
Specific Gravity, Urine: 1.024 (ref 1.005–1.030)

## 2015-05-06 LAB — URINE MICROSCOPIC-ADD ON

## 2015-05-06 LAB — TSH: TSH: 3.211 u[IU]/mL (ref 0.350–4.500)

## 2015-05-06 LAB — CBC WITH DIFFERENTIAL/PLATELET
Basophils Absolute: 0 K/uL (ref 0.0–0.1)
Basophils Relative: 0 %
Eosinophils Absolute: 0 K/uL (ref 0.0–0.7)
Eosinophils Relative: 0 %
HCT: 37 % (ref 36.0–46.0)
Hemoglobin: 12 g/dL (ref 12.0–15.0)
Lymphocytes Relative: 4 %
Lymphs Abs: 0.8 K/uL (ref 0.7–4.0)
MCH: 29.6 pg (ref 26.0–34.0)
MCHC: 32.4 g/dL (ref 30.0–36.0)
MCV: 91.1 fL (ref 78.0–100.0)
Monocytes Absolute: 2.3 K/uL — ABNORMAL HIGH (ref 0.1–1.0)
Monocytes Relative: 11 %
Neutro Abs: 18.1 K/uL — ABNORMAL HIGH (ref 1.7–7.7)
Neutrophils Relative %: 85 %
Platelets: 250 K/uL (ref 150–400)
RBC: 4.06 MIL/uL (ref 3.87–5.11)
RDW: 13.4 % (ref 11.5–15.5)
WBC: 21.2 K/uL — ABNORMAL HIGH (ref 4.0–10.5)

## 2015-05-06 LAB — TROPONIN I
Troponin I: 0.03 ng/mL (ref <0.031)
Troponin I: 0.06 ng/mL — ABNORMAL HIGH (ref <0.031)

## 2015-05-06 LAB — LACTIC ACID, PLASMA: LACTIC ACID, VENOUS: 3.4 mmol/L — AB (ref 0.5–2.0)

## 2015-05-06 LAB — PROCALCITONIN: Procalcitonin: 6.94 ng/mL

## 2015-05-06 LAB — CBG MONITORING, ED: Glucose-Capillary: 374 mg/dL — ABNORMAL HIGH (ref 65–99)

## 2015-05-06 MED ORDER — SODIUM CHLORIDE 0.9 % IV SOLN
INTRAVENOUS | Status: AC
  Administered 2015-05-06 – 2015-05-07 (×3): via INTRAVENOUS

## 2015-05-06 MED ORDER — IOHEXOL 300 MG/ML  SOLN
75.0000 mL | Freq: Once | INTRAMUSCULAR | Status: AC | PRN
  Administered 2015-05-06: 75 mL via INTRAVENOUS

## 2015-05-06 MED ORDER — CEFTRIAXONE SODIUM 1 G IJ SOLR
1.0000 g | Freq: Once | INTRAMUSCULAR | Status: AC
  Administered 2015-05-06: 1 g via INTRAVENOUS
  Filled 2015-05-06: qty 10

## 2015-05-06 MED ORDER — SODIUM CHLORIDE 0.9 % IV BOLUS (SEPSIS)
1000.0000 mL | Freq: Once | INTRAVENOUS | Status: AC
  Administered 2015-05-06: 1000 mL via INTRAVENOUS

## 2015-05-06 MED ORDER — DILTIAZEM HCL 25 MG/5ML IV SOLN
10.0000 mg | Freq: Once | INTRAVENOUS | Status: AC
  Administered 2015-05-06: 10 mg via INTRAVENOUS
  Filled 2015-05-06: qty 5

## 2015-05-06 MED ORDER — DILTIAZEM HCL 100 MG IV SOLR: 5.0000 mg/h | INTRAVENOUS | Status: DC

## 2015-05-06 MED ORDER — ONDANSETRON HCL 4 MG/2ML IJ SOLN
4.0000 mg | Freq: Once | INTRAMUSCULAR | Status: AC
  Administered 2015-05-06: 4 mg via INTRAVENOUS
  Filled 2015-05-06: qty 2

## 2015-05-06 NOTE — ED Notes (Addendum)
N/v/d x 2  Days no pain got uip to go to [facility] am and fell   bp was 100/70 given 4 zofran has iv left ac has A fib

## 2015-05-06 NOTE — ED Provider Notes (Signed)
CSN: [id number]     Arrival date & time 05/06/15  1214 History   First MD Initiated Contact with Patient 05/06/15 1242     Chief Complaint  Patient presents with  . Emesis  . Diarrhea     (Consider location/radiation/quality/duration/timing/severity/associated sxs/prior Treatment) Patient is a 75 y.o. female presenting with vomiting.  Emesis Severity:  Mild Duration:  20 hours Timing:  Intermittent Quality:  Unable to specify Associated symptoms: abdominal pain and diarrhea     Past Medical History  Diagnosis Date  . Blindness of left eye   . Eye muscle weakness     Right eye weakness after cataract surgery  . Common migraine     ?hx of  . DIABETES MELLITUS, TYPE II 03/27/2006    dr [name]  . HYPERLIPIDEMIA 03/27/2006  . 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 HYPERTENSION 03/27/2006  . Atrial fibrillation ([place])     SVT dx 2007, cath 2007 mild CAD, then had an cardioversion, ablation; still on coumadin , has occ palpitation, EKG 03-2010 NSR  . Headache(784.0) 05/07/2009  . LUNG NODULE 09/01/2006    Excision, Bx Benighn  . Osteopenia 2004    Dexa 2004 showed Osteopenia, DEXA 03/2007 normal  . Seizures ([place])   . Intertrochanteric fracture of right hip ([place]) 07/13/2012  . 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 GERD (gastroesophageal reflux disease) 10/05/2011  . Recurrent urinary tract infection     Seeing Urology   Past Surgical History  Procedure Laterality Date  . Shoulder surgery    . Femur im nail Right 07/15/2012    Procedure: INTRAMEDULLARY (IM) NAIL HIP;  Surgeon: Johnny Bridge, MD;  Location: [place];  Service: Orthopedics;  Laterality: Right;   Family History  Problem Relation Age of Onset  . Diabetes Father   . Heart attack Father 88  . Diabetes Sister   . Cancer Neg Hx     no hx of colon or breast cancer   Social History  Substance Use Topics  . Smoking status: Former Smoker    Types: Cigarettes    Quit date: 03/02/1978  . Smokeless tobacco: Never Used  . Alcohol  Use: No   OB History    No data available     Review of Systems  Gastrointestinal: Positive for nausea, vomiting, abdominal pain and diarrhea. Negative for constipation, blood in stool and anal bleeding.  All other systems reviewed and are negative.     Allergies  Procaine hcl  Home Medications   Prior to Admission medications   Medication Sig Start Date End Date Taking? Authorizing Provider  atorvastatin (LIPITOR) 40 MG tablet Take 1 tablet (40 mg total) by mouth at bedtime. 08/07/14  Yes Colon Branch, MD  calcium-vitamin D (OSCAL WITH D) 500-200 MG-UNIT per tablet Take 1 tablet by mouth daily.   Yes Historical Provider, MD  diltiazem (DILACOR XR) 240 MG 24 hr capsule Take 1 capsule (240 mg total) by mouth daily. 08/07/14  Yes Colon Branch, MD  escitalopram (LEXAPRO) 10 MG tablet Take 2 tablets (20 mg total) by mouth daily. 11/28/14  Yes Garvin Fila, MD  LORazepam (ATIVAN) 0.5 MG tablet Take 0.5 mg by mouth as needed for anxiety.   Yes Historical Provider, MD  metoprolol tartrate (LOPRESSOR) 25 MG tablet Take 0.5 tablets (12.5 mg total) by mouth 2 (two) times daily. 01/31/15  Yes Colon Branch, MD  Multiple Vitamins-Minerals (CENTRUM SILVER ADULT 50+ PO) Take 1 tablet by mouth every morning.   Yes  Historical Provider, MD  NOVOLIN 70/30 RELION (70-30) 100 UNIT/ML injection INJECT 50 UNITS SUBCUTANEOUSLY ONCE DAILY WITH BREAKFAST 01/29/15  Yes Renato Shin, MD  phenytoin (DILANTIN) 100 MG ER capsule Take 3 capsules (300 mg total) by mouth daily. 11/26/14  Yes Colon Branch, MD  warfarin (COUMADIN) 2.5 MG tablet Take 1.25 mg by mouth daily.   Yes Historical Provider, MD  augmented betamethasone dipropionate (DIPROLENE-AF) 0.05 % cream Apply topically 2 (two) times daily. Patient not taking: Reported on 05/06/2015 12/28/14   Colon Branch, MD  warfarin (COUMADIN) 2.5 MG tablet USE AS DIRECTED BY ANTICOAGULATION CLINIC Patient not taking: Reported on 05/06/2015 05/03/15   Evans Lance, MD   BP 127/71 mmHg   Pulse 109  Temp(Src) 97.7 F (36.5 C) (Oral)  Resp 20  SpO2 93% Physical Exam  Constitutional: She is oriented to person, place, and time. She appears well-developed and well-nourished.  HENT:  Head: Normocephalic and atraumatic.  Neck: Normal range of motion.  Cardiovascular: An irregularly irregular rhythm present. Tachycardia present.   Pulmonary/Chest: Effort normal and breath sounds normal. No stridor. No respiratory distress.  Abdominal: Soft. Bowel sounds are normal. She exhibits no distension. There is tenderness. There is no rebound and no guarding.  Musculoskeletal: Normal range of motion. She exhibits no edema or tenderness.  Neurological: She is alert and oriented to person, place, and time.  Skin: Skin is warm and dry. No rash noted. No erythema. No pallor.  Nursing note and vitals reviewed.   ED Course  Procedures (including critical care time) Labs Review Labs Reviewed  CBC WITH DIFFERENTIAL/PLATELET - Abnormal; Notable for the following:    WBC 21.2 (*)    Neutro Abs 18.1 (*)    Monocytes Absolute 2.3 (*)    All other components within normal limits  COMPREHENSIVE METABOLIC PANEL - Abnormal; Notable for the following:    Chloride 100 (*)    Glucose, Bld 346 (*)    Creatinine, Ser 1.50 (*)    Calcium 8.4 (*)    Albumin 2.8 (*)    ALT 12 (*)    GFR calc non Af Amer 33 (*)    GFR calc Af Amer 39 (*)    All other components within normal limits  TROPONIN I  LIPASE, BLOOD    Imaging Review No results found. I have personally reviewed and evaluated these images and lab results as part of my medical decision-making.   EKG Interpretation   Date/Time:  Monday May 06 2015 13:16:59 EST Ventricular Rate:  123 PR Interval:    QRS Duration: 92 QT Interval:  342 QTC Calculation: 489 R Axis:   0 Text Interpretation:  Atrial fibrillation Repol abnrm suggests ischemia,  diffuse leads, suspect related to rate Confirmed by [facility] MD, Corene Cornea  [phone]) on  05/06/2015 1:30:32 PM      MDM   Final diagnoses:  None    Approximately 24 hours of vomiting/diarrhea/abdominal pain. No previous episodes. No blood, only dark brown vomit. No green color. Exam as above with mild abdominal ttp.  Likely gastroenteritis however, with tachycardia and abdominal ttp, will check labs for any e/o dehydration.  Significant leukocytosis of unknown origin. No fever, HR has improved. Question possible diverticulitis/colitis/appendicitis. Will ct scan and reeval.   Care to Dr. Thomasene Lot, pending CT results. If all ok and tolerating PO can likely be dc wit hclose PCP follow up. If not, will need admission.     Merrily Pew, MD 05/06/15 [phone]

## 2015-05-06 NOTE — ED Provider Notes (Signed)
Patient's anatomy as 74 year old female with vomiting high white count CAT scan pending.  CT showed emphysematous changes as well as evidence of nephritis. UA pending.  UA shows infection. Discussed with patient and patient's husband. She's been treated as an outpatient for this by her primary care physician. She last finished her medications a week ago. Patient had increasing vomiting and urinary symptoms.  Patient nauseated here. 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  Jayliani Wanner Julio Alm, MD 05/06/15 2045

## 2015-05-06 NOTE — H&P (Signed)
[facility] History and Physical  [name] [name] DOB: 07-Feb-1941 DOA: 05/06/2015  Referring physician: Dr.Mackuen. PCP: [name], MD  Specialists: Dr.Sethi. Neurologist.  Chief Complaint: Weakness with nausea vomiting and diarrhea.  HPI: [name] is a 75 y.o. female with history of atrial fibrillation, diabetes mellitus type 2, seizures and history of herpes encephalitis in 2013 was brought to the ER patient of finding with weekend was having repeated episode of nausea vomiting and diarrhea with abdominal discomfort. Patient was treated for urinary tract infection last week following which over the weekend patient developed nausea vomiting and diarrhea with abdominal discomfort more on the lower quadrants. UA shows features consistent with UTI and the CT abdomen and pelvis shows right-sided perinephric stranding with emphysematous cystitis. Patient has been admitted for sepsis secondary to pyelonephritis. Patient states she was unable to take her medications last 2-3 days because of persistent vomiting. Patient is in A. fib with RVR. Denies any chest pain or shortness of breath or productive cough. On exam abdomen appears benign.   Review of Systems: As presented in the history of presenting illness, rest negative.  Past Medical History  Diagnosis Date  . Blindness of left eye   . Eye muscle weakness     Right eye weakness after cataract surgery  . Common migraine     ?hx of  . DIABETES MELLITUS, TYPE II 03/27/2006    dr [name]  . HYPERLIPIDEMIA 03/27/2006  . 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 HYPERTENSION 03/27/2006  . Atrial fibrillation ([place])     SVT dx 2007, cath 2007 mild CAD, then had an cardioversion, ablation; still on coumadin , has occ palpitation, EKG 03-2010 NSR  . Headache(784.0) 05/07/2009  . LUNG NODULE 09/01/2006    Excision, Bx Benighn  . Osteopenia 2004    Dexa 2004 showed Osteopenia, DEXA 03/2007 normal  . Seizures ([place])   .  Intertrochanteric fracture of right hip ([place]) 07/13/2012  . 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 GERD (gastroesophageal reflux disease) 10/05/2011  . Recurrent urinary tract infection     Seeing Urology   Past Surgical History  Procedure Laterality Date  . Shoulder surgery    . Femur im nail Right 07/15/2012    Procedure: INTRAMEDULLARY (IM) NAIL HIP;  Surgeon: Johnny Bridge, MD;  Location: [place];  Service: Orthopedics;  Laterality: Right;   Social History:  reports that she quit smoking about 37 years ago. Her smoking use included Cigarettes. She has never used smokeless tobacco. She reports that she does not drink alcohol or use illicit drugs. Where does patient live Home. Can patient participate in ADLs? Yes.  Allergies  Allergen Reactions  . Procaine Hcl Anaphylaxis    Family History:  Family History  Problem Relation Age of Onset  . Diabetes Father   . Heart attack Father 78  . Diabetes Sister   . Cancer Neg Hx     no hx of colon or breast cancer      Prior to Admission medications   Medication Sig Start Date End Date Taking? Authorizing Provider  atorvastatin (LIPITOR) 40 MG tablet Take 1 tablet (40 mg total) by mouth at bedtime. 08/07/14  Yes Colon Branch, MD  calcium-vitamin D (OSCAL WITH D) 500-200 MG-UNIT per tablet Take 1 tablet by mouth daily.   Yes Historical Provider, MD  diltiazem (DILACOR XR) 240 MG 24 hr capsule Take 1 capsule (240 mg total) by mouth daily. 08/07/14  Yes Colon Branch, MD  escitalopram (  LEXAPRO) 10 MG tablet Take 2 tablets (20 mg total) by mouth daily. 11/28/14  Yes Garvin Fila, MD  LORazepam (ATIVAN) 0.5 MG tablet Take 0.5 mg by mouth as needed for anxiety.   Yes Historical Provider, MD  metoprolol tartrate (LOPRESSOR) 25 MG tablet Take 0.5 tablets (12.5 mg total) by mouth 2 (two) times daily. 01/31/15  Yes Colon Branch, MD  Multiple Vitamins-Minerals (CENTRUM SILVER ADULT 50+ PO) Take 1 tablet by mouth every morning.   Yes Historical Provider, MD  NOVOLIN  70/30 RELION (70-30) 100 UNIT/ML injection INJECT 50 UNITS SUBCUTANEOUSLY ONCE DAILY WITH BREAKFAST 01/29/15  Yes Renato Shin, MD  phenytoin (DILANTIN) 100 MG ER capsule Take 3 capsules (300 mg total) by mouth daily. 11/26/14  Yes Colon Branch, MD  warfarin (COUMADIN) 2.5 MG tablet Take 1.25 mg by mouth daily.   Yes Historical Provider, MD  augmented betamethasone dipropionate (DIPROLENE-AF) 0.05 % cream Apply topically 2 (two) times daily. Patient not taking: Reported on 05/06/2015 12/28/14   Colon Branch, MD  warfarin (COUMADIN) 2.5 MG tablet USE AS DIRECTED BY ANTICOAGULATION CLINIC Patient not taking: Reported on 05/06/2015 05/03/15   Evans Lance, MD    Physical Exam: Filed Vitals:   05/06/15 1930 05/06/15 2000 05/06/15 2031 05/06/15 2100  BP: 215/117 215/176 169/60 159/34  Pulse: 129 148 143 112  Temp:      TempSrc:      Resp: 33 29 33 25  SpO2: 93% 90% 93%      General:  Moderately built and nourished.  Eyes: Anicteric no pallor.  ENT: No discharge from the ears eyes nose or mouth.  Neck: No mass felt. No JVD appreciated.  Cardiovascular: S1 and S2 heard.  Respiratory: No rhonchi or crepitations.  Abdomen: Soft nontender bowel sounds present. No guarding or rigidity.  Skin: No rash.  Musculoskeletal: No edema.  Psychiatric: Appears normal.  Neurologic: Alert awake oriented to time place and person. Moves all extremities.  Labs on Admission:  Basic Metabolic Panel:  Recent Labs Lab 05/06/15 1438  NA 138  K 4.3  CL 100*  CO2 25  GLUCOSE 346*  BUN 18  CREATININE 1.50*  CALCIUM 8.4*   Liver Function Tests:  Recent Labs Lab 05/06/15 1438  AST 22  ALT 12*  ALKPHOS 120  BILITOT 0.7  PROT 6.5  ALBUMIN 2.8*   No results for input(s): LIPASE, AMYLASE in the last 168 hours. No results for input(s): AMMONIA in the last 168 hours. CBC:  Recent Labs Lab 05/06/15 1438  WBC 21.2*  NEUTROABS 18.1*  HGB 12.0  HCT 37.0  MCV 91.1  PLT 250   Cardiac  Enzymes:  Recent Labs Lab 05/06/15 1438 05/06/15 2146  TROPONINI 0.03 0.06*    BNP (last 3 results) No results for input(s): BNP in the last 8760 hours.  ProBNP (last 3 results) No results for input(s): PROBNP in the last 8760 hours.  CBG:  Recent Labs Lab 05/06/15 2034  GLUCAP 374*    Radiological Exams on Admission: Ct Abdomen Pelvis W Contrast  05/06/2015  CLINICAL DATA:  Nausea, vomiting and diarrhea. Patient fell this morning with decreased blood pressure. History of diabetes and hypertension. EXAM: CT ABDOMEN AND PELVIS WITH CONTRAST TECHNIQUE: Multidetector CT imaging of the abdomen and pelvis was performed using the standard protocol following bolus administration of intravenous contrast. CONTRAST:  26mL OMNIPAQUE IOHEXOL 300 MG/ML  SOLN COMPARISON:  CT 03/06/2015. FINDINGS: Lower chest: Stable atelectasis or scarring in both lung  bases. Cardiomegaly and coronary artery calcifications are noted. There is no pleural or pericardial effusion. Hepatobiliary: The liver has a stable appearance without focal abnormality. Multiple small calcified gallstones are again noted. There is no gallbladder wall thickening or significant biliary dilatation. Pancreas: The pancreas appears unchanged with diffuse atrophy and multiple small cystic lesions, measuring up to 1.7 cm in the pancreatic tail on image number 27. No surrounding inflammatory change or pancreatic ductal dilatation. Spleen: Normal in size without focal abnormality. Adrenals/Urinary Tract: The adrenal glands appear stable without suspicious findings. No renal cortical scarring and atrophy are again noted, right-greater-than-left. There are several low-density renal lesions which are grossly stable. There is new mild dilatation of the right renal pelvis with associated wall thickening and mildly delayed contrast excretion. There is also new asymmetric perinephric soft tissue stranding on the right. No ureteral dilatation or urinary  tract calculus is seen. There is new air within the urinary bladder lumen. Some of this air may be contained within the bladder wall. No air is seen within the kidneys or ureters. There are no inflammatory changes surrounding the bladder. Stomach/Bowel: No evidence of bowel wall thickening, distention or surrounding inflammatory change. The appendix appears normal. There is mild distal colonic diverticulosis. Vascular/Lymphatic: There are no enlarged abdominal or pelvic lymph nodes. There is diffuse atherosclerosis of the aorta, its branches and the iliac arteries. Reproductive: Unremarkable.  No evidence of adnexal mass. Other: No generalized ascites or free air. The anterior abdominal wall appears unremarkable. Musculoskeletal: No acute or significant osseous findings. There are stable degenerative changes throughout the spine associated with a mild convex left scoliosis. Previous right proximal femoral ORIF. Chronic T11 compression deformity appears unchanged. IMPRESSION: 1. Interval change in the appearance of the right kidney with moderate perinephric soft tissue stranding, pelvic wall thickening and delayed contrast excretion. No evidence of obstructing calculus. These findings could be secondary to a recently passed calculus, obstruction from previously suspected papillary necrosis or pyelonephritis. 2. Air within the urinary bladder lumen and possibly the right bladder wall. Emphysematous cystitis should be considered in this diabetic if bladder instrumentation has not been recently performed. Correlation with urine analysis recommended. 3. Cholelithiasis without evidence of cholecystitis or biliary dilatation. 4. 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 Please refer to follow up recommendations issued 2 months ago. Electronically Signed   By: Richardean Sale M.D.   On: 05/06/2015 16:57    EKG: Independently reviewed. A. fib with RVR.  Assessment/Plan Principal Problem:    Sepsis ([place]) Active Problems:   Pyelonephritis   Atrial fibrillation with RVR ([facility])   Nausea vomiting and diarrhea   ARF (acute renal failure) ([place])   1. Sepsis from pyelonephritis and emphysematous cystitis - patient has been placed on Zosyn. Follow urine cultures and blood cultures. Continue with aggressive IV hydration. Follow procalcitonin levels and lactic acid levels. Chest x-ray is pending. 2. A. fib with RVR probably precipitated by sepsis and patient unable to take her oral medications - will start Cardizem infusion if infiltrate does not get better. Patient was given Cardizem bolus in the ER. Continue patient's oral dose of Cardizem and metoprolol. Patient's chads 2 vasc score is 3. Patient is on Coumadin which I have requested pharmacy to dose. 3. Nausea vomiting and diarrhea with abdominal discomfort - probably from pyelonephritis and patient also may have recently passed a stone as per the CAT scan. Since patient was recently on antibiotics we will check stool for C. difficile. CT scan does show  gallstones but patient is not tender at this time. Closely observe. 4. Diabetes mellitus type 2 uncontrolled probably secondary to sepsis - patient is on NovoLog 70/30 daily dosing. Will closely follow CBGs with sliding scale coverage and if CBG remains persistently high or metabolic panel shows anion gap then will start patient on IV insulin. Continue hydration. 5. History of seizures and herpes encephalitis - I have requested pharmacy to monitor and maintain Dilantin dosing and levels. 6. Acute renal failure - probably from dehydration and nausea vomiting and diarrhea. Continue with aggressive hydration and an closely follow intake and output and metabolic panel.   DVT Prophylaxis Lovenox.  Code Status: Full code.  Family Communication: Discussed with patient.  Disposition Plan: Admit to inpatient.    [name] [name] N. [facility] Pager [phone].  If 7PM-7AM, please contact  night-coverage [email or web address] Password [facility] 05/06/2015, 10:44 PM

## 2015-05-06 NOTE — ED Notes (Signed)
Patient transported to CT 

## 2015-05-06 NOTE — ED Notes (Signed)
MD at bedside. 

## 2015-05-07 ENCOUNTER — Inpatient Hospital Stay (HOSPITAL_COMMUNITY): Payer: Medicare Other

## 2015-05-07 ENCOUNTER — Encounter (HOSPITAL_COMMUNITY): Payer: Self-pay

## 2015-05-07 LAB — CBC WITH DIFFERENTIAL/PLATELET
BASOS PCT: 0 %
Basophils Absolute: 0 10*3/uL (ref 0.0–0.1)
EOS PCT: 0 %
Eosinophils Absolute: 0 10*3/uL (ref 0.0–0.7)
HEMATOCRIT: 32.6 % — AB (ref 36.0–46.0)
Hemoglobin: 10.5 g/dL — ABNORMAL LOW (ref 12.0–15.0)
LYMPHS ABS: 0.9 10*3/uL (ref 0.7–4.0)
Lymphocytes Relative: 4 %
MCH: 29.7 pg (ref 26.0–34.0)
MCHC: 32.2 g/dL (ref 30.0–36.0)
MCV: 92.4 fL (ref 78.0–100.0)
MONOS PCT: 8 %
Monocytes Absolute: 1.9 10*3/uL — ABNORMAL HIGH (ref 0.1–1.0)
Neutro Abs: 20.8 10*3/uL — ABNORMAL HIGH (ref 1.7–7.7)
Neutrophils Relative %: 88 %
Platelets: 210 10*3/uL (ref 150–400)
RBC: 3.53 MIL/uL — AB (ref 3.87–5.11)
RDW: 13.8 % (ref 11.5–15.5)
WBC: 23.6 10*3/uL — AB (ref 4.0–10.5)

## 2015-05-07 LAB — TROPONIN I
TROPONIN I: 0.14 ng/mL — AB (ref <0.031)
TROPONIN I: 0.15 ng/mL — AB (ref <0.031)
TROPONIN I: 0.2 ng/mL — AB (ref <0.031)

## 2015-05-07 LAB — GLUCOSE, CAPILLARY
Glucose-Capillary: 373 mg/dL — ABNORMAL HIGH (ref 65–99)
Glucose-Capillary: 212 mg/dL — ABNORMAL HIGH (ref 65–99)
Glucose-Capillary: 141 mg/dL — ABNORMAL HIGH (ref 65–99)

## 2015-05-07 LAB — COMPREHENSIVE METABOLIC PANEL
ALK PHOS: 102 U/L (ref 38–126)
ALT: 13 U/L — AB (ref 14–54)
AST: 28 U/L (ref 15–41)
Albumin: 2.4 g/dL — ABNORMAL LOW (ref 3.5–5.0)
Anion gap: 11 (ref 5–15)
BILIRUBIN TOTAL: 0.5 mg/dL (ref 0.3–1.2)
BUN: 28 mg/dL — AB (ref 6–20)
CALCIUM: 8.7 mg/dL — AB (ref 8.9–10.3)
CO2: 28 mmol/L (ref 22–32)
CREATININE: 2.15 mg/dL — AB (ref 0.44–1.00)
Chloride: 100 mmol/L — ABNORMAL LOW (ref 101–111)
GFR, EST AFRICAN AMERICAN: 25 mL/min — AB (ref >60)
GFR, EST NON AFRICAN AMERICAN: 22 mL/min — AB (ref >60)
Glucose, Bld: 416 mg/dL — ABNORMAL HIGH (ref 65–99)
Potassium: 5.2 mmol/L — ABNORMAL HIGH (ref 3.5–5.1)
Sodium: 139 mmol/L (ref 135–145)
Total Protein: 5.8 g/dL — ABNORMAL LOW (ref 6.5–8.1)

## 2015-05-07 LAB — PROTIME-INR
INR: 1.74 — ABNORMAL HIGH (ref 0.00–1.49)
Prothrombin Time: 20.3 seconds — ABNORMAL HIGH (ref 11.6–15.2)

## 2015-05-07 LAB — PHENYTOIN LEVEL, TOTAL: PHENYTOIN LVL: 3.5 ug/mL — AB (ref 10.0–20.0)

## 2015-05-07 LAB — MRSA PCR SCREENING: MRSA BY PCR: NEGATIVE

## 2015-05-07 LAB — LACTIC ACID, PLASMA
LACTIC ACID, VENOUS: 1.8 mmol/L (ref 0.5–2.0)
Lactic Acid, Venous: 1.8 mmol/L (ref 0.5–2.0)

## 2015-05-07 LAB — LIPASE, BLOOD: Lipase: 15 U/L (ref 11–51)

## 2015-05-07 MED ORDER — PANTOPRAZOLE SODIUM 40 MG PO TBEC
40.0000 mg | DELAYED_RELEASE_TABLET | Freq: Every day | ORAL | Status: DC
Start: 2015-05-07 — End: 2015-05-07

## 2015-05-07 MED ORDER — ACETAMINOPHEN 325 MG PO TABS: 650.0000 mg | ORAL_TABLET | Freq: Four times a day (QID) | ORAL | Status: DC | PRN

## 2015-05-07 MED ORDER — PHENYTOIN SODIUM EXTENDED 100 MG PO CAPS
300.0000 mg | ORAL_CAPSULE | Freq: Every day | ORAL | Status: DC
  Administered 2015-05-07 – 2015-05-11 (×5): 300 mg via ORAL
  Filled 2015-05-07 (×7): qty 3

## 2015-05-07 MED ORDER — ACETAMINOPHEN 650 MG RE SUPP: 650.0000 mg | Freq: Four times a day (QID) | RECTAL | Status: DC | PRN

## 2015-05-07 MED ORDER — PANTOPRAZOLE SODIUM 40 MG PO TBEC
40.0000 mg | DELAYED_RELEASE_TABLET | Freq: Every day | ORAL | Status: DC
  Administered 2015-05-07 – 2015-05-11 (×5): 40 mg via ORAL
  Filled 2015-05-07 (×6): qty 1

## 2015-05-07 MED ORDER — ESCITALOPRAM OXALATE 10 MG PO TABS
20.0000 mg | ORAL_TABLET | Freq: Every day | ORAL | Status: DC
  Administered 2015-05-07 – 2015-05-11 (×5): 20 mg via ORAL
  Filled 2015-05-07 (×9): qty 2

## 2015-05-07 MED ORDER — INSULIN ASPART 100 UNIT/ML ~~LOC~~ SOLN
0.0000 [IU] | Freq: Three times a day (TID) | SUBCUTANEOUS | Status: DC
  Administered 2015-05-07: 5 [IU] via SUBCUTANEOUS
  Administered 2015-05-07: 2 [IU] via SUBCUTANEOUS
  Administered 2015-05-07: 15 [IU] via SUBCUTANEOUS
  Administered 2015-05-08 (×2): 2 [IU] via SUBCUTANEOUS
  Administered 2015-05-08: 3 [IU] via SUBCUTANEOUS
  Administered 2015-05-09: 2 [IU] via SUBCUTANEOUS
  Administered 2015-05-09: 3 [IU] via SUBCUTANEOUS
  Administered 2015-05-09: 8 [IU] via SUBCUTANEOUS
  Administered 2015-05-10: 3 [IU] via SUBCUTANEOUS
  Administered 2015-05-10: 2 [IU] via SUBCUTANEOUS
  Administered 2015-05-11: 3 [IU] via SUBCUTANEOUS
  Administered 2015-05-11: 2 [IU] via SUBCUTANEOUS

## 2015-05-07 MED ORDER — DILTIAZEM HCL ER COATED BEADS 240 MG PO CP24
240.0000 mg | ORAL_CAPSULE | Freq: Every day | ORAL | Status: DC
  Administered 2015-05-07 – 2015-05-09 (×3): 240 mg via ORAL
  Filled 2015-05-07 (×4): qty 1

## 2015-05-07 MED ORDER — WARFARIN SODIUM 2.5 MG PO TABS
2.5000 mg | ORAL_TABLET | Freq: Once | ORAL | Status: AC
  Administered 2015-05-07: 2.5 mg via ORAL
  Filled 2015-05-07: qty 1

## 2015-05-07 MED ORDER — ATORVASTATIN CALCIUM 40 MG PO TABS
40.0000 mg | ORAL_TABLET | Freq: Every day | ORAL | Status: DC
  Administered 2015-05-07 – 2015-05-10 (×4): 40 mg via ORAL
  Filled 2015-05-07 (×4): qty 1

## 2015-05-07 MED ORDER — ONDANSETRON HCL 4 MG PO TABS: 4.0000 mg | ORAL_TABLET | Freq: Four times a day (QID) | ORAL | Status: DC | PRN

## 2015-05-07 MED ORDER — METOPROLOL TARTRATE 12.5 MG HALF TABLET
12.5000 mg | ORAL_TABLET | Freq: Two times a day (BID) | ORAL | Status: DC
  Administered 2015-05-07 – 2015-05-09 (×6): 12.5 mg via ORAL
  Filled 2015-05-07 (×6): qty 1

## 2015-05-07 MED ORDER — ONDANSETRON HCL 4 MG/2ML IJ SOLN
4.0000 mg | Freq: Four times a day (QID) | INTRAMUSCULAR | Status: DC | PRN
  Administered 2015-05-07 – 2015-05-09 (×4): 4 mg via INTRAVENOUS
  Filled 2015-05-07 (×5): qty 2

## 2015-05-07 MED ORDER — WARFARIN - PHARMACIST DOSING INPATIENT
Freq: Every day | Status: DC
  Administered 2015-05-07 – 2015-05-10 (×2)

## 2015-05-07 MED ORDER — PIPERACILLIN-TAZOBACTAM 3.375 G IVPB
3.3750 g | Freq: Three times a day (TID) | INTRAVENOUS | Status: DC
  Administered 2015-05-07 – 2015-05-09 (×7): 3.375 g via INTRAVENOUS
  Filled 2015-05-07 (×11): qty 50

## 2015-05-07 MED ORDER — INSULIN ASPART PROT & ASPART (70-30 MIX) 100 UNIT/ML ~~LOC~~ SUSP
50.0000 [IU] | Freq: Every day | SUBCUTANEOUS | Status: DC
  Administered 2015-05-07 – 2015-05-11 (×5): 50 [IU] via SUBCUTANEOUS
  Filled 2015-05-07 (×2): qty 10

## 2015-05-07 MED ORDER — HYDROMORPHONE HCL 1 MG/ML IJ SOLN
1.0000 mg | INTRAMUSCULAR | Status: DC | PRN
  Administered 2015-05-07 – 2015-05-10 (×2): 1 mg via INTRAVENOUS
  Filled 2015-05-07 (×2): qty 1

## 2015-05-07 NOTE — Progress Notes (Addendum)
CONSULT NOTE - Follow Up Consult  Pharmacy Consult for coumadin. phenytoin Indication: afib, hx of seizures  Allergies  Allergen Reactions  . Procaine Hcl Anaphylaxis    Patient Measurements: Height: 5\' 9"  (175.3 cm) Weight: 221 lb 12.5 oz (100.6 kg) IBW/kg (Calculated) : 66.2  Vital Signs: Temp: 98.1 F (36.7 C) (03/07 0740) Temp Source: Oral (03/07 0740) BP: 142/98 mmHg (03/07 0740) Pulse Rate: 77 (03/07 0740)  Labs:  Recent Labs  05/06/15 1438 05/06/15 2146 05/06/15 2345 05/07/15 0608  HGB 12.0  --   --  10.5*  HCT 37.0  --   --  32.6*  PLT 250  --   --  210  LABPROT  --   --   --  20.3*  INR  --   --   --  1.74*  CREATININE 1.50*  --   --  2.15*  TROPONINI 0.03 0.06* 0.14* 0.20*    Estimated Creatinine Clearance: 29.4 mL/min (by C-G formula based on Cr of 2.15).   Medications:  Prescriptions prior to admission  Medication Sig Dispense Refill Last Dose  . atorvastatin (LIPITOR) 40 MG tablet Take 1 tablet (40 mg total) by mouth at bedtime. 30 tablet 8 05/05/2015 at Unknown time  . calcium-vitamin D (OSCAL WITH D) 500-200 MG-UNIT per tablet Take 1 tablet by mouth daily.   05/06/2015 at Unknown time  . diltiazem (DILACOR XR) 240 MG 24 hr capsule Take 1 capsule (240 mg total) by mouth daily. 30 capsule 8 05/05/2015 at Unknown time  . escitalopram (LEXAPRO) 10 MG tablet Take 2 tablets (20 mg total) by mouth daily. 90 tablet 2 05/06/2015 at Unknown time  . LORazepam (ATIVAN) 0.5 MG tablet Take 0.5 mg by mouth as needed for anxiety.   05/05/2015 at Unknown time  . metoprolol tartrate (LOPRESSOR) 25 MG tablet Take 0.5 tablets (12.5 mg total) by mouth 2 (two) times daily. 90 tablet 2 05/06/2015 at 0700  . Multiple Vitamins-Minerals (CENTRUM SILVER ADULT 50+ PO) Take 1 tablet by mouth every morning.   05/06/2015 at Unknown time  . NOVOLIN 70/30 RELION (70-30) 100 UNIT/ML injection INJECT 50 UNITS SUBCUTANEOUSLY ONCE DAILY WITH BREAKFAST 20 mL 2 05/05/2015 at Unknown time  . phenytoin  (DILANTIN) 100 MG ER capsule Take 3 capsules (300 mg total) by mouth daily. 90 capsule 5 05/06/2015 at Unknown time  . warfarin (COUMADIN) 2.5 MG tablet Take 1.25 mg by mouth daily.   05/05/2015 at Unknown time  . augmented betamethasone dipropionate (DIPROLENE-AF) 0.05 % cream Apply topically 2 (two) times daily. (Patient not taking: Reported on 05/06/2015) 60 g 0   . warfarin (COUMADIN) 2.5 MG tablet USE AS DIRECTED BY ANTICOAGULATION CLINIC (Patient not taking: Reported on 05/06/2015) 60 tablet 3    Scheduled:  . atorvastatin  40 mg Oral QHS  . diltiazem  240 mg Oral Daily  . escitalopram  20 mg Oral Daily  . insulin aspart  0-15 Units Subcutaneous TID WC  . insulin aspart protamine- aspart  50 Units Subcutaneous Q breakfast  . metoprolol tartrate  12.5 mg Oral BID  . pantoprazole  40 mg Oral Daily  . piperacillin-tazobactam (ZOSYN)  IV  3.375 g Intravenous 3 times per day  . Warfarin - Pharmacist Dosing Inpatient   Does not apply q1800    Assessment: 75 yo female here with possible sepsis on zosyn. She is on coumadin at home and pharmacy consulted to dose. INR today= 1.74.  PTA coumadin: 1.25mg /d (last dose 3/5)  She is  also on phenytoin (300mg  po daily) for history of seizures. Pharmacy to follow levels and appropriate dosing.   Goal of Therapy:  INR 2-3 Monitor platelets by anticoagulation protocol: Yes  Phenytoin level= 10-20   Plan:  -Coumadin 2.5mg  po today -Will check a phenytoin level today -Daily PT/INR  Hildred Laser, Pharm D 05/07/2015 10:04 AM   Addendum -phenytoin level= 3.5 (albumin= 2.4; corrected= 6) -suspect non-compliance  Plan -Phenytoin 300mg  po daily -Will consider re-checking in about 5-7 days  Hildred Laser, Pharm D 05/07/2015 12:14 PM

## 2015-05-07 NOTE — Progress Notes (Signed)
Received a call from the lab. relaying blood culture result done yest. + gram neg. rod on both, MD made aware thru text page. Awaiting call back.

## 2015-05-07 NOTE — Progress Notes (Signed)
ANTICOAGULATION CONSULT NOTE - Initial Consult  Pharmacy Consult for Coumadin Indication: atrial fibrillation  Allergies  Allergen Reactions  . Procaine Hcl Anaphylaxis    Patient Measurements:    Vital Signs: BP: 139/51 mmHg (03/06 2330) Pulse Rate: 105 (03/06 2330)  Labs:  Recent Labs  05/06/15 1438 05/06/15 2146 05/06/15 2345  HGB 12.0  --   --   HCT 37.0  --   --   PLT 250  --   --   CREATININE 1.50*  --   --   TROPONINI 0.03 0.06* 0.14*    CrCl cannot be calculated (Unknown ideal weight.).   Medical History: Past Medical History  Diagnosis Date  . Blindness of left eye   . Eye muscle weakness     Right eye weakness after cataract surgery  . Common migraine     ?hx of  . DIABETES MELLITUS, TYPE II 03/27/2006    dr [name]  . HYPERLIPIDEMIA 03/27/2006  . 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 HYPERTENSION 03/27/2006  . Atrial fibrillation ([place])     SVT dx 2007, cath 2007 mild CAD, then had an cardioversion, ablation; still on coumadin , has occ palpitation, EKG 03-2010 NSR  . Headache(784.0) 05/07/2009  . LUNG NODULE 09/01/2006    Excision, Bx [facility]  . Osteopenia 2004    Dexa 2004 showed Osteopenia, DEXA 03/2007 normal  . Seizures ([place])   . Intertrochanteric fracture of right hip ([place]) 07/13/2012  . 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 GERD (gastroesophageal reflux disease) 10/05/2011  . Recurrent urinary tract infection     Seeing Urology    Medications:  Lipitor  Diltiazem  Lexapro  Lopressor  Novolin 70/30  Oscal  Ativan  MVI Dilantin 300 mg daily (last dose 3/6) Coumadin 1.25 mg daily (last dose 3/5)  Assessment: 75 y.o. female admitted with N/V, h/o Afib, to continue Coumadin.  Phenytoin to continue for h/o seizures  Goal of Therapy:  INR 2-3 Monitor platelets by anticoagulation protocol: Yes   Plan:  Daily INR As N/V may be sign of phenytoin toxicity, will check phenytoin level in am   Phillis Knack, PharmD, BCPS  05/07/2015,12:40 AM

## 2015-05-07 NOTE — Progress Notes (Signed)
[facility] PROGRESS NOTE  [name] [name] [id number] DOB: 06-03-1940 DOA: 05/06/2015 PCP: [name], MD  From history of present illness 75 y.o. female with history of atrial fibrillation, diabetes mellitus type 2, seizures and history of herpes encephalitis in 2013 was brought to the ER patient of finding with weekend was having repeated episode of nausea vomiting and diarrhea with abdominal discomfort. Patient was treated for urinary tract infection last week following which over the weekend patient developed nausea vomiting and diarrhea with abdominal discomfort more on the lower quadrants.  Assessment/Plan: 1. Sepsis from pyelonephritis and emphysematous cystitis - continue patient on Zosyn. Urine culture growing E coli. Continue IV fluid hydration. Lactic acid trending down. Chest x-ray is pending. Blood cultures growing gram negative rods patient currently on Zosyn. 2. A. fib with RVR probably precipitated by sepsis and patient unable to take her oral medications  Patient was given Cardizem bolus in the ER. Continue patient's oral dose of Cardizem and metoprolol. Patient's chads 2 vasc score is 3. Patient is on Coumadin which I have requested pharmacy to dose. 3. Nausea vomiting and diarrhea with abdominal discomfort - most likely from pyelonephritis and patient also may have recently passed a stone as per the CAT scan. Since patient was recently on antibiotics we will check stool for C. difficile. CT scan does show gallstones but patient is not tender at this time. Closely observe. 4. Diabetes mellitus type 2 uncontrolled probably secondary to sepsis - patient is on NovoLog 70/30 daily dosing. Will closely follow CBGs with sliding scale coverage and if CBG remains persistently high or metabolic panel shows anion gap then will start patient on IV insulin. Continue hydration. 5. History of seizures and herpes encephalitis - I have requested pharmacy to monitor and maintain Dilantin dosing  and levels. 6. Acute renal failure - probably from dehydration and nausea vomiting and diarrhea. Continue IVF rehydration and reassess S creatinine next am.  Code Status: full Family Communication: d/c patient directly Disposition Plan: pending improvement in condition   Consultants:  none  Procedures:  None  Antibiotics:  Zosyn  HPI/Subjective: Pt has no new complaints no acute issues overnight.  Objective: Filed Vitals:   05/07/15 1200 05/07/15 1609  BP: 130/54 153/49  Pulse: 78 68  Temp: 98.6 F (37 C) 98 F (36.7 C)  Resp: 19 26    Intake/Output Summary (Last 24 hours) at 05/07/15 1736 Last data filed at 05/07/15 1700  Gross per 24 hour  Intake   2925 ml  Output    450 ml  Net   2475 ml   Filed Weights   05/07/15 0200  Weight: 100.6 kg (221 lb 12.5 oz)    Exam:   General:  Pt in nad, alert and awake  Cardiovascular: rrr, no mrg  Respiratory: cta bl, no wheezes  Abdomen: soft, nd, no guarding.  Musculoskeletal: no cyanosis or clubbing   Data Reviewed: Basic Metabolic Panel:  Recent Labs Lab 05/06/15 1438 05/07/15 0608  NA 138 139  K 4.3 5.2*  CL 100* 100*  CO2 25 28  GLUCOSE 346* 416*  BUN 18 28*  CREATININE 1.50* 2.15*  CALCIUM 8.4* 8.7*   Liver Function Tests:  Recent Labs Lab 05/06/15 1438 05/07/15 0608  AST 22 28  ALT 12* 13*  ALKPHOS 120 102  BILITOT 0.7 0.5  PROT 6.5 5.8*  ALBUMIN 2.8* 2.4*    Recent Labs Lab 05/07/15 0349  LIPASE 15   No results for input(s): AMMONIA in the  last 168 hours. CBC:  Recent Labs Lab 05/06/15 1438 05/07/15 0608  WBC 21.2* 23.6*  NEUTROABS 18.1* 20.8*  HGB 12.0 10.5*  HCT 37.0 32.6*  MCV 91.1 92.4  PLT 250 210   Cardiac Enzymes:  Recent Labs Lab 05/06/15 1438 05/06/15 2146 05/06/15 2345 05/07/15 0608 05/07/15 1033  TROPONINI 0.03 0.06* 0.14* 0.20* 0.15*   BNP (last 3 results) No results for input(s): BNP in the last 8760 hours.  ProBNP (last 3 results) No  results for input(s): PROBNP in the last 8760 hours.  CBG:  Recent Labs Lab 05/06/15 2034 05/07/15 0801 05/07/15 1251 05/07/15 1640  GLUCAP 374* 373* 212* 141*    Recent Results (from the past 240 hour(s))  Urine culture     Status: None (Preliminary result)   Collection Time: 05/06/15  6:38 PM  Result Value Ref Range Status   Specimen Description URINE, CLEAN CATCH  Final   Special Requests NONE  Final   Culture >=100,000 COLONIES/mL ESCHERICHIA COLI  Final   Report Status PENDING  Incomplete  Blood culture (routine x 2)     Status: None (Preliminary result)   Collection Time: 05/06/15  9:20 PM  Result Value Ref Range Status   Specimen Description BLOOD RIGHT ANTECUBITAL  Final   Special Requests BOTTLES DRAWN AEROBIC AND ANAEROBIC 5CC  Final   Culture  Setup Time   Final    GRAM NEGATIVE RODS IN BOTH AEROBIC AND ANAEROBIC BOTTLES CRITICAL RESULT CALLED TO, READ BACK BY AND VERIFIED WITH: C [place] 05/07/15 @ 21 M VESTAL    Culture GRAM NEGATIVE RODS  Final   Report Status PENDING  Incomplete  Blood culture (routine x 2)     Status: None (Preliminary result)   Collection Time: 05/06/15  9:25 PM  Result Value Ref Range Status   Specimen Description BLOOD RIGHT HAND  Final   Special Requests BOTTLES DRAWN AEROBIC AND ANAEROBIC 5CC  Final   Culture  Setup Time   Final    GRAM NEGATIVE RODS IN BOTH AEROBIC AND ANAEROBIC BOTTLES CRITICAL RESULT CALLED TO, READ BACK BY AND VERIFIED WITH: C [place] 05/07/15 @ 7 M VESTAL    Culture GRAM NEGATIVE RODS  Final   Report Status PENDING  Incomplete  MRSA PCR Screening     Status: None   Collection Time: 05/07/15  1:58 AM  Result Value Ref Range Status   MRSA by PCR NEGATIVE NEGATIVE Final    Comment:        The GeneXpert MRSA Assay (FDA approved for NASAL specimens only), is one component of a comprehensive MRSA colonization surveillance program. It is not intended to diagnose MRSA infection nor to guide or monitor  treatment for MRSA infections.      Studies: Ct Abdomen Pelvis W Contrast  05/06/2015  CLINICAL DATA:  Nausea, vomiting and diarrhea. Patient fell this morning with decreased blood pressure. History of diabetes and hypertension. EXAM: CT ABDOMEN AND PELVIS WITH CONTRAST TECHNIQUE: Multidetector CT imaging of the abdomen and pelvis was performed using the standard protocol following bolus administration of intravenous contrast. CONTRAST:  36mL OMNIPAQUE IOHEXOL 300 MG/ML  SOLN COMPARISON:  CT 03/06/2015. FINDINGS: Lower chest: Stable atelectasis or scarring in both lung bases. Cardiomegaly and coronary artery calcifications are noted. There is no pleural or pericardial effusion. Hepatobiliary: The liver has a stable appearance without focal abnormality. Multiple small calcified gallstones are again noted. There is no gallbladder wall thickening or significant biliary dilatation. Pancreas: The pancreas appears unchanged with  diffuse atrophy and multiple small cystic lesions, measuring up to 1.7 cm in the pancreatic tail on image number 27. No surrounding inflammatory change or pancreatic ductal dilatation. Spleen: Normal in size without focal abnormality. Adrenals/Urinary Tract: The adrenal glands appear stable without suspicious findings. No renal cortical scarring and atrophy are again noted, right-greater-than-left. There are several low-density renal lesions which are grossly stable. There is new mild dilatation of the right renal pelvis with associated wall thickening and mildly delayed contrast excretion. There is also new asymmetric perinephric soft tissue stranding on the right. No ureteral dilatation or urinary tract calculus is seen. There is new air within the urinary bladder lumen. Some of this air may be contained within the bladder wall. No air is seen within the kidneys or ureters. There are no inflammatory changes surrounding the bladder. Stomach/Bowel: No evidence of bowel wall thickening,  distention or surrounding inflammatory change. The appendix appears normal. There is mild distal colonic diverticulosis. Vascular/Lymphatic: There are no enlarged abdominal or pelvic lymph nodes. There is diffuse atherosclerosis of the aorta, its branches and the iliac arteries. Reproductive: Unremarkable.  No evidence of adnexal mass. Other: No generalized ascites or free air. The anterior abdominal wall appears unremarkable. Musculoskeletal: No acute or significant osseous findings. There are stable degenerative changes throughout the spine associated with a mild convex left scoliosis. Previous right proximal femoral ORIF. Chronic T11 compression deformity appears unchanged. IMPRESSION: 1. Interval change in the appearance of the right kidney with moderate perinephric soft tissue stranding, pelvic wall thickening and delayed contrast excretion. No evidence of obstructing calculus. These findings could be secondary to a recently passed calculus, obstruction from previously suspected papillary necrosis or pyelonephritis. 2. Air within the urinary bladder lumen and possibly the right bladder wall. Emphysematous cystitis should be considered in this diabetic if bladder instrumentation has not been recently performed. Correlation with urine analysis recommended. 3. Cholelithiasis without evidence of cholecystitis or biliary dilatation. 4. 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 Please refer to follow up recommendations issued 2 months ago. Electronically Signed   By: Richardean Sale M.D.   On: 05/06/2015 16:57   Dg Chest Port 1 View  05/07/2015  CLINICAL DATA:  Sepsis, UTI, abdominal pain EXAM: PORTABLE CHEST 1 VIEW COMPARISON:  08/23/2014 and 11/21/2012 FINDINGS: Cardiomegaly again noted. Chronic elevation of the right hemidiaphragm. Central mild vascular congestion and mild interstitial prominence bilateral without convincing pulmonary edema. Stable old fracture of the right sixth rib.  No segmental infiltrate. Mild basilar atelectasis. Suboptimal study due to patient's large body habitus IMPRESSION: Central mild vascular congestion and mild interstitial prominence bilaterally without convincing pulmonary edema. Suboptimal study due to patient's large body habitus. Chronic elevation of the right hemidiaphragm. No segmental infiltrate. Mild basilar atelectasis. Electronically Signed   By: Lahoma Crocker M.D.   On: 05/07/2015 07:55    Scheduled Meds: . atorvastatin  40 mg Oral QHS  . diltiazem  240 mg Oral Daily  . escitalopram  20 mg Oral Daily  . insulin aspart  0-15 Units Subcutaneous TID WC  . insulin aspart protamine- aspart  50 Units Subcutaneous Q breakfast  . metoprolol tartrate  12.5 mg Oral BID  . pantoprazole  40 mg Oral Daily  . phenytoin  300 mg Oral Daily  . piperacillin-tazobactam (ZOSYN)  IV  3.375 g Intravenous 3 times per day  . Warfarin - Pharmacist Dosing Inpatient   Does not apply q1800   Continuous Infusions: . sodium chloride 150 mL/hr at 05/07/15 0600  .  diltiazem (CARDIZEM) infusion Stopped (05/06/15 2326)    Time spent: > 35 minutes   Velvet Bathe  [facility] Pager [phone] If 7PM-7AM, please contact night-coverage at [email or web address], password [facility] 05/07/2015, 5:36 PM  LOS: 1 day

## 2015-05-08 DIAGNOSIS — N390 Urinary tract infection, site not specified: Secondary | ICD-10-CM

## 2015-05-08 DIAGNOSIS — N179 Acute kidney failure, unspecified: Secondary | ICD-10-CM

## 2015-05-08 DIAGNOSIS — I4891 Unspecified atrial fibrillation: Secondary | ICD-10-CM

## 2015-05-08 DIAGNOSIS — N12 Tubulo-interstitial nephritis, not specified as acute or chronic: Secondary | ICD-10-CM

## 2015-05-08 DIAGNOSIS — B9620 Unspecified Escherichia coli [E. coli] as the cause of diseases classified elsewhere: Secondary | ICD-10-CM

## 2015-05-08 DIAGNOSIS — D72829 Elevated white blood cell count, unspecified: Secondary | ICD-10-CM

## 2015-05-08 LAB — BASIC METABOLIC PANEL
Anion gap: 11 (ref 5–15)
BUN: 45 mg/dL — AB (ref 6–20)
CO2: 23 mmol/L (ref 22–32)
CREATININE: 2.56 mg/dL — AB (ref 0.44–1.00)
Calcium: 8.2 mg/dL — ABNORMAL LOW (ref 8.9–10.3)
Chloride: 104 mmol/L (ref 101–111)
GFR, EST AFRICAN AMERICAN: 20 mL/min — AB (ref >60)
GFR, EST NON AFRICAN AMERICAN: 17 mL/min — AB (ref >60)
Glucose, Bld: 200 mg/dL — ABNORMAL HIGH (ref 65–99)
Potassium: 4.8 mmol/L (ref 3.5–5.1)
SODIUM: 138 mmol/L (ref 135–145)

## 2015-05-08 LAB — CBC
HCT: 31 % — ABNORMAL LOW (ref 36.0–46.0)
Hemoglobin: 10.1 g/dL — ABNORMAL LOW (ref 12.0–15.0)
MCH: 30.6 pg (ref 26.0–34.0)
MCHC: 32.6 g/dL (ref 30.0–36.0)
MCV: 93.9 fL (ref 78.0–100.0)
PLATELETS: 182 10*3/uL (ref 150–400)
RBC: 3.3 MIL/uL — AB (ref 3.87–5.11)
RDW: 13.8 % (ref 11.5–15.5)
WBC: 19.9 10*3/uL — AB (ref 4.0–10.5)

## 2015-05-08 LAB — URINE CULTURE: Culture: >=100000

## 2015-05-08 LAB — GLUCOSE, CAPILLARY
GLUCOSE-CAPILLARY: 178 mg/dL — AB (ref 65–99)
Glucose-Capillary: 128 mg/dL — ABNORMAL HIGH (ref 65–99)
Glucose-Capillary: 139 mg/dL — ABNORMAL HIGH (ref 65–99)
Glucose-Capillary: 131 mg/dL — ABNORMAL HIGH (ref 65–99)
Glucose-Capillary: 212 mg/dL — ABNORMAL HIGH (ref 65–99)

## 2015-05-08 LAB — PROCALCITONIN: PROCALCITONIN: 64.19 ng/mL

## 2015-05-08 LAB — PROTIME-INR
INR: 2.15 — ABNORMAL HIGH (ref 0.00–1.49)
Prothrombin Time: 23.9 seconds — ABNORMAL HIGH (ref 11.6–15.2)

## 2015-05-08 MED ORDER — LORAZEPAM 0.5 MG PO TABS
0.5000 mg | ORAL_TABLET | Freq: Two times a day (BID) | ORAL | Status: DC | PRN
  Administered 2015-05-09 – 2015-05-11 (×2): 0.5 mg via ORAL
  Filled 2015-05-08 (×2): qty 1

## 2015-05-08 MED ORDER — SODIUM CHLORIDE 0.9 % IV SOLN
INTRAVENOUS | Status: AC
  Administered 2015-05-08 – 2015-05-09 (×3): via INTRAVENOUS

## 2015-05-08 MED ORDER — WARFARIN SODIUM 2.5 MG PO TABS
1.2500 mg | ORAL_TABLET | Freq: Once | ORAL | Status: AC
  Administered 2015-05-08: 1.25 mg via ORAL
  Filled 2015-05-08: qty 0.5

## 2015-05-08 NOTE — Progress Notes (Signed)
CONSULT NOTE - Follow Up Consult  Pharmacy Consult for coumadin.  Indication: afib  Allergies  Allergen Reactions  . Procaine Hcl Anaphylaxis    Patient Measurements: Height: 5\' 9"  (175.3 cm) Weight: 227 lb (102.967 kg) IBW/kg (Calculated) : 66.2  Vital Signs: Temp: 97.9 F (36.6 C) (03/08 0831) Temp Source: Oral (03/08 0831) BP: 141/53 mmHg (03/08 0825) Pulse Rate: 64 (03/08 0825)  Labs:  Recent Labs  05/06/15 1438  05/06/15 2345 05/07/15 0608 05/07/15 1033 05/08/15 0535 05/08/15 0745  HGB 12.0  --   --  10.5*  --   --  10.1*  HCT 37.0  --   --  32.6*  --   --  31.0*  PLT 250  --   --  210  --   --  182  LABPROT  --   --   --  20.3*  --  23.9*  --   INR  --   --   --  1.74*  --  2.15*  --   CREATININE 1.50*  --   --  2.15*  --   --  2.56*  TROPONINI 0.03  < > 0.14* 0.20* 0.15*  --   --   < > = values in this interval not displayed.  Estimated Creatinine Clearance: 25 mL/min (by C-G formula based on Cr of 2.56).   Medications:  Prescriptions prior to admission  Medication Sig Dispense Refill Last Dose  . atorvastatin (LIPITOR) 40 MG tablet Take 1 tablet (40 mg total) by mouth at bedtime. 30 tablet 8 05/05/2015 at Unknown time  . calcium-vitamin D (OSCAL WITH D) 500-200 MG-UNIT per tablet Take 1 tablet by mouth daily.   05/06/2015 at Unknown time  . diltiazem (DILACOR XR) 240 MG 24 hr capsule Take 1 capsule (240 mg total) by mouth daily. 30 capsule 8 05/05/2015 at Unknown time  . escitalopram (LEXAPRO) 10 MG tablet Take 2 tablets (20 mg total) by mouth daily. 90 tablet 2 05/06/2015 at Unknown time  . LORazepam (ATIVAN) 0.5 MG tablet Take 0.5 mg by mouth as needed for anxiety.   05/05/2015 at Unknown time  . metoprolol tartrate (LOPRESSOR) 25 MG tablet Take 0.5 tablets (12.5 mg total) by mouth 2 (two) times daily. 90 tablet 2 05/06/2015 at 0700  . Multiple Vitamins-Minerals (CENTRUM SILVER ADULT 50+ PO) Take 1 tablet by mouth every morning.   05/06/2015 at Unknown time  .  NOVOLIN 70/30 RELION (70-30) 100 UNIT/ML injection INJECT 50 UNITS SUBCUTANEOUSLY ONCE DAILY WITH BREAKFAST 20 mL 2 05/05/2015 at Unknown time  . phenytoin (DILANTIN) 100 MG ER capsule Take 3 capsules (300 mg total) by mouth daily. 90 capsule 5 05/06/2015 at Unknown time  . warfarin (COUMADIN) 2.5 MG tablet Take 1.25 mg by mouth daily.   05/05/2015 at Unknown time  . augmented betamethasone dipropionate (DIPROLENE-AF) 0.05 % cream Apply topically 2 (two) times daily. (Patient not taking: Reported on 05/06/2015) 60 g 0   . warfarin (COUMADIN) 2.5 MG tablet USE AS DIRECTED BY ANTICOAGULATION CLINIC (Patient not taking: Reported on 05/06/2015) 60 tablet 3    Scheduled:  . atorvastatin  40 mg Oral QHS  . diltiazem  240 mg Oral Daily  . escitalopram  20 mg Oral Daily  . insulin aspart  0-15 Units Subcutaneous TID WC  . insulin aspart protamine- aspart  50 Units Subcutaneous Q breakfast  . metoprolol tartrate  12.5 mg Oral BID  . pantoprazole  40 mg Oral Daily  . phenytoin  300  mg Oral Daily  . piperacillin-tazobactam (ZOSYN)  IV  3.375 g Intravenous 3 times per day  . Warfarin - Pharmacist Dosing Inpatient   Does not apply q1800    Assessment: 75 yo female here with possible sepsis on zosyn. She is on coumadin at home and pharmacy consulted to dose. INR today= 2.15 (trend up).  PTA coumadin: 1.25mg /d   Goal of Therapy:  INR 2-3 Monitor platelets by anticoagulation protocol: Yes  Phenytoin level= 10-20   Plan:  -Coumadin 1.25 mg po today -Daily PT/INR  Hildred Laser, Pharm D 05/08/2015 9:52 AM   Addendum -phenytoin level= 3.5 (albumin= 2.4; corrected= 6) -suspect non-compliance  Plan -Phenytoin 300mg  po daily -Will consider re-checking in about 5-7 days  Hildred Laser, Pharm D 05/08/2015 9:52 AM

## 2015-05-08 NOTE — Progress Notes (Addendum)
[facility] PROGRESS NOTE  [name] [name] [id number] DOB: 1940/04/28 DOA: 05/06/2015 PCP: [name], MD Brief narrative 75 year old female with history of A. fib on Coumadin, uncontrolled type 2 diabetes mellitus, seizures disorder, history of herpes cephalitis in 2013 brought to the ED with multiple episodes of nausea, vomiting and diarrhea with right lower quadrant abdominal pain. She was treated for UTI 1 week back. In the ED she was septic with tachycardia, accelerated hypertension, and significant leukocytosis. UA was positive for UTI. Placed on empiric Zosyn, Cardizem drip and admitted to step down unit.  Assessment/Plan: Sepsis secondary to acute pyelonephritis Continue empiric Zosyn. Urine culture growing Escherichia coli which is mostly sensitive. Blood culture also growing Escherichia coli. Sensitivity pending. Leukocytosis slowly improving. Remains afebrile. Continue IV hydration.  Acute on chronic kidney disease stage II Baseline creatinine of 1-1.2. Renal function progressively worsened. Possibly due to acute pyelonephritis and emphysematous cystitis. Check bladder scan. I will resume IV fluids. Avoid nephrotoxins. Monitor intake and output closely.  A. fib with RVR Likely triggered by sepsis with dehydration. Required Cardizem drip on admission. Now heart rate controlled. Continue by mouth Cardizem and metoprolol. Continue Coumadin, dosing per pharmacy.  Nausea vomiting and diarrhea Now resolved. Has not had bowel movement since admission.  Uncontrolled type 2 diabetes mellitus  on Lantus 50 units at bedtime with sliding cell coverage. CBG much controlled.  History of seizures and herpetic encephalitis Continue Dilantin.  Elevated troponin Suspect demand ischemia with underlying sepsis and AKI. Troponin peaked at 0.20. Denied any chest pain symptoms but had some repolarization abnormality on EKG. Will check 2-D echo.  DVT prophylaxis: On Coumadin  Diet:  Diabetic  Code Status: Full code Family Communication: None at bedside Disposition Plan: Transfer to telemetry.   Consultants:  None  Procedures:  CT abdomen and pelvis  Antibiotics:  IV Zosyn since 3/6  HPI/Subjective: Seen and examined. Has some pain in RLQ. Denies dysuria. No further N/V or diarrhea  Objective: Filed Vitals:   05/08/15 0831 05/08/15 1200  BP:  156/63  Pulse:  64  Temp: 97.9 F (36.6 C) 97.6 F (36.4 C)  Resp:  14    Intake/Output Summary (Last 24 hours) at 05/08/15 1323 Last data filed at 05/08/15 1300  Gross per 24 hour  Intake   1745 ml  Output    550 ml  Net   1195 ml   Filed Weights   05/07/15 0200 05/08/15 0500  Weight: 100.6 kg (221 lb 12.5 oz) 102.967 kg (227 lb)    Exam:   General:  Elderly obese female not in distress  HEENT: No pallor, moist mucosa, supple neck  Chest: Clear bilaterally  CVS: S1 and S2 irregular, no murmurs rub or gallop   GI: Soft, nondistended, right lower quadrant tenderness, bowel sounds present  Musculoskeletal: Warm, no edema  CNS: Alert and oriented    Data Reviewed: Basic Metabolic Panel:  Recent Labs Lab 05/06/15 1438 05/07/15 0608 05/08/15 0745  NA 138 139 138  K 4.3 5.2* 4.8  CL 100* 100* 104  CO2 25 28 23   GLUCOSE 346* 416* 200*  BUN 18 28* 45*  CREATININE 1.50* 2.15* 2.56*  CALCIUM 8.4* 8.7* 8.2*   Liver Function Tests:  Recent Labs Lab 05/06/15 1438 05/07/15 0608  AST 22 28  ALT 12* 13*  ALKPHOS 120 102  BILITOT 0.7 0.5  PROT 6.5 5.8*  ALBUMIN 2.8* 2.4*    Recent Labs Lab 05/07/15 0349  LIPASE 15   No results  for input(s): AMMONIA in the last 168 hours. CBC:  Recent Labs Lab 05/06/15 1438 05/07/15 0608 05/08/15 0745  WBC 21.2* 23.6* 19.9*  NEUTROABS 18.1* 20.8*  --   HGB 12.0 10.5* 10.1*  HCT 37.0 32.6* 31.0*  MCV 91.1 92.4 93.9  PLT 250 210 182   Cardiac Enzymes:  Recent Labs Lab 05/06/15 1438 05/06/15 2146 05/06/15 2345 05/07/15 0608  05/07/15 1033  TROPONINI 0.03 0.06* 0.14* 0.20* 0.15*   BNP (last 3 results) No results for input(s): BNP in the last 8760 hours.  ProBNP (last 3 results) No results for input(s): PROBNP in the last 8760 hours.  CBG:  Recent Labs Lab 05/07/15 1251 05/07/15 1640 05/07/15 2153 05/08/15 0825 05/08/15 1200  GLUCAP 212* 141* 128* 178* 139*    Recent Results (from the past 240 hour(s))  Urine culture     Status: None   Collection Time: 05/06/15  6:38 PM  Result Value Ref Range Status   Specimen Description URINE, CLEAN CATCH  Final   Special Requests NONE  Final   Culture >=100,000 COLONIES/mL ESCHERICHIA COLI  Final   Report Status 05/08/2015 FINAL  Final   Organism ID, Bacteria ESCHERICHIA COLI  Final      Susceptibility   Escherichia coli - MIC*    AMPICILLIN >=32 RESISTANT Resistant     CEFAZOLIN <=4 SENSITIVE Sensitive     CEFTRIAXONE <=1 SENSITIVE Sensitive     CIPROFLOXACIN 0.5 SENSITIVE Sensitive     GENTAMICIN <=1 SENSITIVE Sensitive     IMIPENEM <=0.25 SENSITIVE Sensitive     NITROFURANTOIN <=16 SENSITIVE Sensitive     TRIMETH/SULFA >=320 RESISTANT Resistant     AMPICILLIN/SULBACTAM 16 INTERMEDIATE Intermediate     PIP/TAZO <=4 SENSITIVE Sensitive     * >=100,000 COLONIES/mL ESCHERICHIA COLI  Blood culture (routine x 2)     Status: None (Preliminary result)   Collection Time: 05/06/15  9:20 PM  Result Value Ref Range Status   Specimen Description BLOOD RIGHT ANTECUBITAL  Final   Special Requests BOTTLES DRAWN AEROBIC AND ANAEROBIC 5CC  Final   Culture  Setup Time   Final    GRAM NEGATIVE RODS IN BOTH AEROBIC AND ANAEROBIC BOTTLES CRITICAL RESULT CALLED TO, READ BACK BY AND VERIFIED WITH: C [place] 05/07/15 @ 64 M VESTAL    Culture ESCHERICHIA COLI  Final   Report Status PENDING  Incomplete  Blood culture (routine x 2)     Status: None (Preliminary result)   Collection Time: 05/06/15  9:25 PM  Result Value Ref Range Status   Specimen Description BLOOD  RIGHT HAND  Final   Special Requests BOTTLES DRAWN AEROBIC AND ANAEROBIC 5CC  Final   Culture  Setup Time   Final    GRAM NEGATIVE RODS IN BOTH AEROBIC AND ANAEROBIC BOTTLES CRITICAL RESULT CALLED TO, READ BACK BY AND VERIFIED WITH: C [place] 05/07/15 @ 6 M VESTAL    Culture ESCHERICHIA COLI SUSCEPTIBILITIES TO FOLLOW   Final   Report Status PENDING  Incomplete  MRSA PCR Screening     Status: None   Collection Time: 05/07/15  1:58 AM  Result Value Ref Range Status   MRSA by PCR NEGATIVE NEGATIVE Final    Comment:        The GeneXpert MRSA Assay (FDA approved for NASAL specimens only), is one component of a comprehensive MRSA colonization surveillance program. It is not intended to diagnose MRSA infection nor to guide or monitor treatment for MRSA infections.  Studies: Ct Abdomen Pelvis W Contrast  05/06/2015  CLINICAL DATA:  Nausea, vomiting and diarrhea. Patient fell this morning with decreased blood pressure. History of diabetes and hypertension. EXAM: CT ABDOMEN AND PELVIS WITH CONTRAST TECHNIQUE: Multidetector CT imaging of the abdomen and pelvis was performed using the standard protocol following bolus administration of intravenous contrast. CONTRAST:  107mL OMNIPAQUE IOHEXOL 300 MG/ML  SOLN COMPARISON:  CT 03/06/2015. FINDINGS: Lower chest: Stable atelectasis or scarring in both lung bases. Cardiomegaly and coronary artery calcifications are noted. There is no pleural or pericardial effusion. Hepatobiliary: The liver has a stable appearance without focal abnormality. Multiple small calcified gallstones are again noted. There is no gallbladder wall thickening or significant biliary dilatation. Pancreas: The pancreas appears unchanged with diffuse atrophy and multiple small cystic lesions, measuring up to 1.7 cm in the pancreatic tail on image number 27. No surrounding inflammatory change or pancreatic ductal dilatation. Spleen: Normal in size without focal abnormality.  Adrenals/Urinary Tract: The adrenal glands appear stable without suspicious findings. No renal cortical scarring and atrophy are again noted, right-greater-than-left. There are several low-density renal lesions which are grossly stable. There is new mild dilatation of the right renal pelvis with associated wall thickening and mildly delayed contrast excretion. There is also new asymmetric perinephric soft tissue stranding on the right. No ureteral dilatation or urinary tract calculus is seen. There is new air within the urinary bladder lumen. Some of this air may be contained within the bladder wall. No air is seen within the kidneys or ureters. There are no inflammatory changes surrounding the bladder. Stomach/Bowel: No evidence of bowel wall thickening, distention or surrounding inflammatory change. The appendix appears normal. There is mild distal colonic diverticulosis. Vascular/Lymphatic: There are no enlarged abdominal or pelvic lymph nodes. There is diffuse atherosclerosis of the aorta, its branches and the iliac arteries. Reproductive: Unremarkable.  No evidence of adnexal mass. Other: No generalized ascites or free air. The anterior abdominal wall appears unremarkable. Musculoskeletal: No acute or significant osseous findings. There are stable degenerative changes throughout the spine associated with a mild convex left scoliosis. Previous right proximal femoral ORIF. Chronic T11 compression deformity appears unchanged. IMPRESSION: 1. Interval change in the appearance of the right kidney with moderate perinephric soft tissue stranding, pelvic wall thickening and delayed contrast excretion. No evidence of obstructing calculus. These findings could be secondary to a recently passed calculus, obstruction from previously suspected papillary necrosis or pyelonephritis. 2. Air within the urinary bladder lumen and possibly the right bladder wall. Emphysematous cystitis should be considered in this diabetic if  bladder instrumentation has not been recently performed. Correlation with urine analysis recommended. 3. Cholelithiasis without evidence of cholecystitis or biliary dilatation. 4. 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 Please refer to follow up recommendations issued 2 months ago. Electronically Signed   By: Richardean Sale M.D.   On: 05/06/2015 16:57   Dg Chest Port 1 View  05/07/2015  CLINICAL DATA:  Sepsis, UTI, abdominal pain EXAM: PORTABLE CHEST 1 VIEW COMPARISON:  08/23/2014 and 11/21/2012 FINDINGS: Cardiomegaly again noted. Chronic elevation of the right hemidiaphragm. Central mild vascular congestion and mild interstitial prominence bilateral without convincing pulmonary edema. Stable old fracture of the right sixth rib. No segmental infiltrate. Mild basilar atelectasis. Suboptimal study due to patient's large body habitus IMPRESSION: Central mild vascular congestion and mild interstitial prominence bilaterally without convincing pulmonary edema. Suboptimal study due to patient's large body habitus. Chronic elevation of the right hemidiaphragm. No segmental infiltrate. Mild basilar atelectasis.  Electronically Signed   By: Lahoma Crocker M.D.   On: 05/07/2015 07:55    Scheduled Meds: . atorvastatin  40 mg Oral QHS  . diltiazem  240 mg Oral Daily  . escitalopram  20 mg Oral Daily  . insulin aspart  0-15 Units Subcutaneous TID WC  . insulin aspart protamine- aspart  50 Units Subcutaneous Q breakfast  . metoprolol tartrate  12.5 mg Oral BID  . pantoprazole  40 mg Oral Daily  . phenytoin  300 mg Oral Daily  . piperacillin-tazobactam (ZOSYN)  IV  3.375 g Intravenous 3 times per day  . warfarin  1.25 mg Oral ONCE-1800  . Warfarin - Pharmacist Dosing Inpatient   Does not apply q1800   Continuous Infusions: . diltiazem (CARDIZEM) infusion Stopped (05/06/15 2326)      Time spent: 35 minutes    Lesslie Mckeehan, [place]  [facility] Pager [phone]. If 7PM-7AM, please  contact night-coverage at [email or web address], password [facility] 05/08/2015, 1:23 PM  LOS: 2 days

## 2015-05-09 ENCOUNTER — Other Ambulatory Visit (HOSPITAL_COMMUNITY): Payer: Medicare Other

## 2015-05-09 DIAGNOSIS — I10 Essential (primary) hypertension: Secondary | ICD-10-CM

## 2015-05-09 LAB — GLUCOSE, CAPILLARY
GLUCOSE-CAPILLARY: 116 mg/dL — AB (ref 65–99)
Glucose-Capillary: 192 mg/dL — ABNORMAL HIGH (ref 65–99)
Glucose-Capillary: 281 mg/dL — ABNORMAL HIGH (ref 65–99)
Glucose-Capillary: 144 mg/dL — ABNORMAL HIGH (ref 65–99)

## 2015-05-09 LAB — CULTURE, BLOOD (ROUTINE X 2)

## 2015-05-09 LAB — PROTIME-INR
INR: 2.35 — AB (ref 0.00–1.49)
PROTHROMBIN TIME: 25.5 s — AB (ref 11.6–15.2)

## 2015-05-09 LAB — CBC
HEMATOCRIT: 29.9 % — AB (ref 36.0–46.0)
Hemoglobin: 10 g/dL — ABNORMAL LOW (ref 12.0–15.0)
MCH: 30.7 pg (ref 26.0–34.0)
MCHC: 33.4 g/dL (ref 30.0–36.0)
MCV: 91.7 fL (ref 78.0–100.0)
PLATELETS: 193 10*3/uL (ref 150–400)
RBC: 3.26 MIL/uL — ABNORMAL LOW (ref 3.87–5.11)
RDW: 13.7 % (ref 11.5–15.5)
WBC: 14.7 10*3/uL — ABNORMAL HIGH (ref 4.0–10.5)

## 2015-05-09 LAB — BASIC METABOLIC PANEL
Anion gap: 11 (ref 5–15)
BUN: 38 mg/dL — ABNORMAL HIGH (ref 6–20)
CALCIUM: 8.4 mg/dL — AB (ref 8.9–10.3)
CO2: 22 mmol/L (ref 22–32)
CREATININE: 1.92 mg/dL — AB (ref 0.44–1.00)
Chloride: 103 mmol/L (ref 101–111)
GFR calc non Af Amer: 25 mL/min — ABNORMAL LOW (ref >60)
GFR, EST AFRICAN AMERICAN: 29 mL/min — AB (ref >60)
GLUCOSE: 202 mg/dL — AB (ref 65–99)
Potassium: 4 mmol/L (ref 3.5–5.1)
Sodium: 136 mmol/L (ref 135–145)

## 2015-05-09 MED ORDER — AMLODIPINE BESYLATE 5 MG PO TABS: 5.0000 mg | ORAL_TABLET | Freq: Every day | ORAL | Status: DC

## 2015-05-09 MED ORDER — SODIUM CHLORIDE 0.9 % IV SOLN
INTRAVENOUS | Status: AC
  Administered 2015-05-09 (×2): via INTRAVENOUS

## 2015-05-09 MED ORDER — METOPROLOL TARTRATE 25 MG PO TABS
25.0000 mg | ORAL_TABLET | Freq: Two times a day (BID) | ORAL | Status: DC
  Administered 2015-05-09: 25 mg via ORAL
  Filled 2015-05-09: qty 1

## 2015-05-09 MED ORDER — WARFARIN SODIUM 2.5 MG PO TABS
1.2500 mg | ORAL_TABLET | Freq: Every day | ORAL | Status: DC
  Administered 2015-05-09: 1.25 mg via ORAL
  Filled 2015-05-09: qty 0.5

## 2015-05-09 MED ORDER — DILTIAZEM HCL ER COATED BEADS 180 MG PO CP24: 300.0000 mg | ORAL_CAPSULE | Freq: Every day | ORAL | Status: DC

## 2015-05-09 MED ORDER — CEFAZOLIN SODIUM-DEXTROSE 2-3 GM-% IV SOLR
2.0000 g | Freq: Three times a day (TID) | INTRAVENOUS | Status: DC
  Administered 2015-05-09 – 2015-05-11 (×7): 2 g via INTRAVENOUS
  Filled 2015-05-09 (×9): qty 50

## 2015-05-09 NOTE — Progress Notes (Signed)
Pharmacy Antibiotic Note  [name] [name] is a 75 y.o. female admitted on 05/06/2015 with E. Coli bacteremia.  Pharmacy has been consulted forcefazolin dosing.  Susceptibilities came back today and E. Coli in blood and urine is sensitive to cefazolin. Afebrile, WBC down to 14.7  Plan: Stop Zosyn Start cefazolin 2g IV Q8  Mon itor clinical picture, renal function F/U LOT   Height: 5\' 9"  (175.3 cm) Weight: 229 lb 6.4 oz (104.055 kg) (scale b) IBW/kg (Calculated) : 66.2  Temp (24hrs), Avg:98.4 F (36.9 C), Min:97.9 F (36.6 C), Max:98.7 F (37.1 C)   Recent Labs Lab 05/06/15 1438 05/06/15 2147 05/06/15 2346 05/07/15 0243 05/07/15 0608 05/08/15 0745 05/09/15 0524  WBC 21.2*  --   --   --  23.6* 19.9* 14.7*  CREATININE 1.50*  --   --   --  2.15* 2.56* 1.92*  LATICACIDVEN  --  3.4* 1.8 1.8  --   --   --     Estimated Creatinine Clearance: 33.5 mL/min (by C-G formula based on Cr of 1.92).    Allergies  Allergen Reactions  . Procaine Hcl Anaphylaxis    Antimicrobials this admission: Zosyn 3/7 >> 3/9 Ctx 3/6 x 1 Cefazolin 3/9 >>  Dose adjustments this admission: n/a  Microbiology results: 3/6 Blood cx- E.coli 3/6 urine- e coli (E- amp, unasyn, bactrim, S- ancef, ctx, cipro, gent, imi, nitro, zosy)  Thank you for allowing pharmacy to be a part of this patient's care.  Reginia Naas 05/09/2015 1:01 PM

## 2015-05-09 NOTE — Progress Notes (Signed)
CONSULT NOTE - Follow Up Consult  Pharmacy Consult for coumadin.  Indication: afib  Allergies  Allergen Reactions  . Procaine Hcl Anaphylaxis    Patient Measurements: Height: 5\' 9"  (175.3 cm) Weight: 229 lb 6.4 oz (104.055 kg) (scale b) IBW/kg (Calculated) : 66.2  Vital Signs: Temp: 97.9 F (36.6 C) (03/09 0756) Temp Source: Oral (03/09 0756) BP: 171/70 mmHg (03/09 0900) Pulse Rate: 78 (03/09 0900)  Labs:  Recent Labs  05/06/15 2345 05/07/15 0608 05/07/15 1033 05/08/15 0535 05/08/15 0745 05/09/15 0524  HGB  --  10.5*  --   --  10.1* 10.0*  HCT  --  32.6*  --   --  31.0* 29.9*  PLT  --  210  --   --  182 193  LABPROT  --  20.3*  --  23.9*  --  25.5*  INR  --  1.74*  --  2.15*  --  2.35*  CREATININE  --  2.15*  --   --  2.56* 1.92*  TROPONINI 0.14* 0.20* 0.15*  --   --   --     Estimated Creatinine Clearance: 33.5 mL/min (by C-G formula based on Cr of 1.92).   Assessment: 75 yo female on coumadin for afib. INR 2.35. hgb 10, plt 193, stable. No bleeding noted per chart.  PTA coumadin: 1.25mg /d   Goal of Therapy:  INR 2-3 Monitor platelets by anticoagulation protocol: Yes  Phenytoin level= 10-20   Plan:  -Coumadin 1.25 mg po today -Daily PT/INR  Maryanna Shape, PharmD, BCPS  Clinical Pharmacist  Pager: [phone]    05/09/2015 10:04 AM

## 2015-05-09 NOTE — Evaluation (Signed)
Physical Therapy Evaluation Patient Details Name: [name] [name] MRN: [id number] DOB: [birth year] Today's Date: 05/09/2015   History of Present Illness  75 yo female with onset of sepsis recent UTI, elevated but declining troponins, PMHx:  herpes encephalopathy, osteopenia, R hip ORIF  Clinical Impression  Pt was noted to have mild unsteadiness with gait but can control with RW.  Her plan is to get home with husband and will need to include steps to ensure a safe transition for her.  Will focus on balance and stair climbing to meet her needs, and may involve her husband if needed.    Follow Up Recommendations Home health PT;Supervision - Intermittent    Equipment Recommendations  None recommended by PT    Recommendations for Other Services Rehab consult     Precautions / Restrictions Precautions Precautions: Other (comment) (telemetry) Restrictions Weight Bearing Restrictions: No      Mobility  Bed Mobility Overal bed mobility: Modified Independent                Transfers Overall transfer level: Modified independent Equipment used: Rolling walker (2 wheeled);1 person hand held assist             General transfer comment: supervised for safety  Ambulation/Gait Ambulation/Gait assistance: Min guard Ambulation Distance (Feet): 120 Feet Assistive device: Rolling walker (2 wheeled) Gait Pattern/deviations: Step-through pattern;Wide base of support;Shuffle Gait velocity: reduced Gait velocity interpretation: Below normal speed for age/gender General Gait Details: slow pace with control for pulses  Stairs            Wheelchair Mobility    Modified Rankin (Stroke Patients Only)       Balance                                             Pertinent Vitals/Pain Pain Assessment: Faces Faces Pain Scale: Hurts little more Pain Location: R abdomen Pain Descriptors / Indicators: Aching Pain Intervention(s): Monitored during  session;Premedicated before session    Home Living Family/patient expects to be discharged to:: Private residence Living Arrangements: Spouse/significant other Available Help at Discharge: Family;Available 24 hours/day Type of Home: House Home Access: Stairs to enter   [employer] of Steps: 3 Home Layout: Multi-level Home Equipment: Walker - 2 wheels;Cane - single point Additional Comments: was using cane mainly prior to hosptial    Prior Function Level of Independence: Independent with assistive device(s)               Hand Dominance        Extremity/Trunk Assessment   Upper Extremity Assessment: Overall WFL for tasks assessed           Lower Extremity Assessment: Overall WFL for tasks assessed      Cervical / Trunk Assessment: Normal  Communication   Communication: No difficulties  Cognition Arousal/Alertness: Awake/alert Behavior During Therapy: WFL for tasks assessed/performed Overall Cognitive Status: Within Functional Limits for tasks assessed                      General Comments General comments (skin integrity, edema, etc.): Pt is noted to have pulses pregait of 78 and post gait up to 114 but then down quickly    Exercises        Assessment/Plan    PT Assessment Patient needs continued PT services  PT Diagnosis Difficulty walking   PT  Problem List Decreased range of motion;Decreased activity tolerance;Decreased balance;Decreased mobility;Decreased coordination;Decreased skin integrity;Obesity;Cardiopulmonary status limiting activity  PT Treatment Interventions DME instruction;Gait training;Stair training;Functional mobility training;Therapeutic activities;Therapeutic exercise;Balance training;Neuromuscular re-education;Patient/family education   PT Goals (Current goals can be found in the Care Plan section) Acute Rehab PT Goals Patient Stated Goal: to get rid of stomach pain PT Goal Formulation: With patient Time For Goal  Achievement: 05/23/15 Potential to Achieve Goals: Good    Frequency Min 2X/week   Barriers to discharge Inaccessible home environment multi level home    Co-evaluation               End of Session Equipment Utilized During Treatment: Gait belt Activity Tolerance: Patient tolerated treatment well Patient left: in chair;with call bell/phone within reach Nurse Communication: Mobility status         Time: [id number] PT Time Calculation (min) (ACUTE ONLY): 23 min   Charges:   PT Evaluation $PT Eval Moderate Complexity: 1 Procedure PT Treatments $Gait Training: 8-22 mins   PT G CodesRamond Dial 2015-05-23, 1:44 PM   Mee Hives, PT MS Acute Rehab Dept. Number: [phone] [id number] and [place] [phone]

## 2015-05-09 NOTE — Progress Notes (Addendum)
[facility] PROGRESS NOTE  [name] [name] [id number] DOB: [birth year] DOA: 05/06/2015 PCP: [name], MD Brief narrative 75 year old female with history of A. fib on Coumadin, uncontrolled type 2 diabetes mellitus, seizures disorder, history of herpes cephalitis in 2013 brought to the ED with multiple episodes of nausea, vomiting and diarrhea with right lower quadrant abdominal pain. She was treated for UTI 1 week back. In the ED she was septic with tachycardia, accelerated hypertension, and significant leukocytosis. UA was positive for UTI. Placed on empiric Zosyn, Cardizem drip and admitted to step down unit.  Assessment/Plan: Sepsis secondary to acute pyelonephritis Both urine and blood culture growing Escherichia coli which is mostly sensitivity. Zosyn switched to Ancef. Once renal function returns to normal with placing a PICC line and discharge on Ancef for a total 2 weeks course.  Leukocytosis  improving. Remains afebrile.   Acute on chronic kidney disease stage II Baseline creatinine of 1-1.2. Possibly due to acute pyelonephritis and emphysematous cystitis. Now slowly improving. Continue IV fluids. Avoid nephrotoxins.  A. fib with RVR Likely triggered by sepsis with dehydration. Required Cardizem drip on admission. Now heart rate controlled. Continue by mouth Cardizem and metoprolol. Continue Coumadin, dosing per pharmacy.  Essential hypertension Blood pressure elevated. Would increase Cardizem and metoprolol dose.  Nausea vomiting and diarrhea Now resolved. No further diarrhea since admission.  Uncontrolled type 2 diabetes mellitus  on Lantus 50 units at bedtime with sliding cell coverage. CBG much controlled.  History of seizures and herpetic encephalitis Continue Dilantin.  Elevated troponin Suspect demand ischemia with underlying sepsis and AKI. Troponin peaked at 0.20. Denied any chest pain symptoms but had some repolarization abnormality on EKG.  check  2-D echo.  DVT prophylaxis: On Coumadin  Diet: Diabetic  Code Status: Full code Family Communication: None at bedside Disposition Plan: Continue telemetry monitoring.. PT evaluation   Consultants:  None  Procedures:  CT abdomen and pelvis  Antibiotics:  IV Zosyn since 3/6  HPI/Subjective: Seen and examined.  still has pain in her right lower quadrant. No dysuria. No GI symptoms.   Objective: Filed Vitals:   05/09/15 0756 05/09/15 0900  BP: 179/62 171/70  Pulse: 74 78  Temp: 97.9 F (36.6 C)   Resp: 18     Intake/Output Summary (Last 24 hours) at 05/09/15 1249 Last data filed at 05/09/15 0903  Gross per 24 hour  Intake 2451.67 ml  Output    650 ml  Net 1801.67 ml   Filed Weights   05/08/15 0500 05/08/15 1639 05/09/15 0427  Weight: 102.967 kg (227 lb) 103.2 kg (227 lb 8.2 oz) 104.055 kg (229 lb 6.4 oz)    Exam:   General:  Elderly obese female not in distress  HEENT: moist mucosa,   Chest: Clear bilaterally  CVS: S1 and S2 irregular, no murmurs rub or gallop   GI: Soft, nondistended, right lower quadrant tenderness, bowel sounds present  Musculoskeletal: Warm, no edema      Data Reviewed: Basic Metabolic Panel:  Recent Labs Lab 05/06/15 1438 05/07/15 0608 05/08/15 0745 05/09/15 0524  NA 138 139 138 136  K 4.3 5.2* 4.8 4.0  CL 100* 100* 104 103  CO2 25 28 23 22   GLUCOSE 346* 416* 200* 202*  BUN 18 28* 45* 38*  CREATININE 1.50* 2.15* 2.56* 1.92*  CALCIUM 8.4* 8.7* 8.2* 8.4*   Liver Function Tests:  Recent Labs Lab 05/06/15 1438 05/07/15 0608  AST 22 28  ALT 12* 13*  ALKPHOS 120 102  BILITOT 0.7 0.5  PROT 6.5 5.8*  ALBUMIN 2.8* 2.4*    Recent Labs Lab 05/07/15 0349  LIPASE 15   No results for input(s): AMMONIA in the last 168 hours. CBC:  Recent Labs Lab 05/06/15 1438 05/07/15 0608 05/08/15 0745 05/09/15 0524  WBC 21.2* 23.6* 19.9* 14.7*  NEUTROABS 18.1* 20.8*  --   --   HGB 12.0 10.5* 10.1* 10.0*  HCT 37.0  32.6* 31.0* 29.9*  MCV 91.1 92.4 93.9 91.7  PLT 250 210 182 193   Cardiac Enzymes:  Recent Labs Lab 05/06/15 1438 05/06/15 2146 05/06/15 2345 05/07/15 0608 05/07/15 1033  TROPONINI 0.03 0.06* 0.14* 0.20* 0.15*   BNP (last 3 results) No results for input(s): BNP in the last 8760 hours.  ProBNP (last 3 results) No results for input(s): PROBNP in the last 8760 hours.  CBG:  Recent Labs Lab 05/08/15 1200 05/08/15 1706 05/08/15 2057 05/09/15 0631 05/09/15 1145  GLUCAP 139* 131* 212* 192* 281*    Recent Results (from the past 240 hour(s))  Urine culture     Status: None   Collection Time: 05/06/15  6:38 PM  Result Value Ref Range Status   Specimen Description URINE, CLEAN CATCH  Final   Special Requests NONE  Final   Culture >=100,000 COLONIES/mL ESCHERICHIA COLI  Final   Report Status 05/08/2015 FINAL  Final   Organism ID, Bacteria ESCHERICHIA COLI  Final      Susceptibility   Escherichia coli - MIC*    AMPICILLIN >=32 RESISTANT Resistant     CEFAZOLIN <=4 SENSITIVE Sensitive     CEFTRIAXONE <=1 SENSITIVE Sensitive     CIPROFLOXACIN 0.5 SENSITIVE Sensitive     GENTAMICIN <=1 SENSITIVE Sensitive     IMIPENEM <=0.25 SENSITIVE Sensitive     NITROFURANTOIN <=16 SENSITIVE Sensitive     TRIMETH/SULFA >=320 RESISTANT Resistant     AMPICILLIN/SULBACTAM 16 INTERMEDIATE Intermediate     PIP/TAZO <=4 SENSITIVE Sensitive     * >=100,000 COLONIES/mL ESCHERICHIA COLI  Blood culture (routine x 2)     Status: None   Collection Time: 05/06/15  9:20 PM  Result Value Ref Range Status   Specimen Description BLOOD RIGHT ANTECUBITAL  Final   Special Requests BOTTLES DRAWN AEROBIC AND ANAEROBIC 5CC  Final   Culture  Setup Time   Final    GRAM NEGATIVE RODS IN BOTH AEROBIC AND ANAEROBIC BOTTLES CRITICAL RESULT CALLED TO, READ BACK BY AND VERIFIED WITH: C [place] 05/07/15 @ 22 M VESTAL    Culture   Final    ESCHERICHIA COLI SUSCEPTIBILITIES PERFORMED ON PREVIOUS CULTURE WITHIN THE  LAST 5 DAYS.    Report Status 05/09/2015 FINAL  Final  Blood culture (routine x 2)     Status: None   Collection Time: 05/06/15  9:25 PM  Result Value Ref Range Status   Specimen Description BLOOD RIGHT HAND  Final   Special Requests BOTTLES DRAWN AEROBIC AND ANAEROBIC 5CC  Final   Culture  Setup Time   Final    GRAM NEGATIVE RODS IN BOTH AEROBIC AND ANAEROBIC BOTTLES CRITICAL RESULT CALLED TO, READ BACK BY AND VERIFIED WITH: C [place] 05/07/15 @ [place]  Final   Report Status 05/09/2015 FINAL  Final   Organism ID, Bacteria ESCHERICHIA COLI  Final      Susceptibility   Escherichia coli - MIC*    AMPICILLIN >=32 RESISTANT Resistant     CEFAZOLIN <=4 SENSITIVE Sensitive  CEFEPIME <=1 SENSITIVE Sensitive     CEFTAZIDIME <=1 SENSITIVE Sensitive     CEFTRIAXONE <=1 SENSITIVE Sensitive     CIPROFLOXACIN <=0.25 SENSITIVE Sensitive     GENTAMICIN <=1 SENSITIVE Sensitive     IMIPENEM <=0.25 SENSITIVE Sensitive     TRIMETH/SULFA >=320 RESISTANT Resistant     AMPICILLIN/SULBACTAM 16 INTERMEDIATE Intermediate     PIP/TAZO <=4 SENSITIVE Sensitive     * ESCHERICHIA COLI  MRSA PCR Screening     Status: None   Collection Time: 05/07/15  1:58 AM  Result Value Ref Range Status   MRSA by PCR NEGATIVE NEGATIVE Final    Comment:        The GeneXpert MRSA Assay (FDA approved for NASAL specimens only), is one component of a comprehensive MRSA colonization surveillance program. It is not intended to diagnose MRSA infection nor to guide or monitor treatment for MRSA infections.      Studies: No results found.  Scheduled Meds: . atorvastatin  40 mg Oral QHS  . diltiazem  240 mg Oral Daily  . escitalopram  20 mg Oral Daily  . insulin aspart  0-15 Units Subcutaneous TID WC  . insulin aspart protamine- aspart  50 Units Subcutaneous Q breakfast  . metoprolol tartrate  12.5 mg Oral BID  . pantoprazole  40 mg Oral Daily  . phenytoin  300 mg Oral Daily  .  piperacillin-tazobactam (ZOSYN)  IV  3.375 g Intravenous 3 times per day  . warfarin  1.25 mg Oral q1800  . Warfarin - Pharmacist Dosing Inpatient   Does not apply q1800   Continuous Infusions: . sodium chloride 100 mL/hr at 05/09/15 0551      Time spent: 25 minutes    Waylon Koffler, [facility] Pager [phone]. If 7PM-7AM, please contact night-coverage at [email or web address], password [facility] 05/09/2015, 12:49 PM  LOS: 3 days

## 2015-05-10 ENCOUNTER — Inpatient Hospital Stay (HOSPITAL_COMMUNITY): Payer: Medicare Other

## 2015-05-10 DIAGNOSIS — I4891 Unspecified atrial fibrillation: Secondary | ICD-10-CM

## 2015-05-10 LAB — BASIC METABOLIC PANEL
ANION GAP: 11 (ref 5–15)
BUN: 24 mg/dL — ABNORMAL HIGH (ref 6–20)
CALCIUM: 8.3 mg/dL — AB (ref 8.9–10.3)
CO2: 23 mmol/L (ref 22–32)
CREATININE: 1.52 mg/dL — AB (ref 0.44–1.00)
Chloride: 104 mmol/L (ref 101–111)
GFR, EST AFRICAN AMERICAN: 38 mL/min — AB (ref >60)
GFR, EST NON AFRICAN AMERICAN: 33 mL/min — AB (ref >60)
Glucose, Bld: 154 mg/dL — ABNORMAL HIGH (ref 65–99)
Potassium: 3.7 mmol/L (ref 3.5–5.1)
SODIUM: 138 mmol/L (ref 135–145)

## 2015-05-10 LAB — ECHOCARDIOGRAM COMPLETE
CHL CUP AORTIC ROOT 2D: 33 mm
CHL CUP LA SIZE INDEX: 1.91 mm/m2
CHL CUP LA VOL 2D: 163 mL
FS: 20 % — AB (ref 28–44)
HEIGHTINCHES: 69 in
IV/PV OW: 0.98
LA VOL 2D INDEX: 74.1 mL/m2
LA vol index: 66.4 mL/m2
LA vol: 146 mL
LASIZE: 42 mm
LDCA: 3.14 cm2
LEFT ATRIUM END SYS DIAM: 42 mm
LV PW d: 12.9 mm — AB (ref 0.6–1.1)
LVIDD: 40.4 mm — AB (ref 3.5–6.0)
LVIDS: 32.3 mm — AB (ref 2.1–4.0)
LVOTD: 20 mm
PWSYS: 12.9 mm
TR max vel: 324 cm/s
TV PEAK RV-RA GRADIENT: 42 cm/s
Weight: 3697.6 oz

## 2015-05-10 LAB — GLUCOSE, CAPILLARY
GLUCOSE-CAPILLARY: 141 mg/dL — AB (ref 65–99)
GLUCOSE-CAPILLARY: 65 mg/dL (ref 65–99)
Glucose-Capillary: 167 mg/dL — ABNORMAL HIGH (ref 65–99)
Glucose-Capillary: 152 mg/dL — ABNORMAL HIGH (ref 65–99)
Glucose-Capillary: 168 mg/dL — ABNORMAL HIGH (ref 65–99)

## 2015-05-10 LAB — PROTIME-INR
INR: 2.82 — AB (ref 0.00–1.49)
PROTHROMBIN TIME: 29.2 s — AB (ref 11.6–15.2)

## 2015-05-10 LAB — PROCALCITONIN: PROCALCITONIN: 18.26 ng/mL

## 2015-05-10 MED ORDER — DILTIAZEM HCL ER COATED BEADS 180 MG PO CP24
360.0000 mg | ORAL_CAPSULE | Freq: Every day | ORAL | Status: DC
  Administered 2015-05-10 – 2015-05-11 (×2): 360 mg via ORAL
  Filled 2015-05-10 (×2): qty 2

## 2015-05-10 MED ORDER — METOPROLOL TARTRATE 50 MG PO TABS
50.0000 mg | ORAL_TABLET | Freq: Two times a day (BID) | ORAL | Status: DC
  Administered 2015-05-10 (×2): 50 mg via ORAL
  Filled 2015-05-10 (×3): qty 1

## 2015-05-10 MED ORDER — WARFARIN SODIUM 2 MG PO TABS
1.0000 mg | ORAL_TABLET | Freq: Once | ORAL | Status: AC
  Administered 2015-05-10: 1 mg via ORAL
  Filled 2015-05-10: qty 0.5

## 2015-05-10 NOTE — Progress Notes (Signed)
1700 pt 's bs  62   No sign of hypoglycemia 1 cuo OJ and graham crackers given. Will check BS in 15 mins

## 2015-05-10 NOTE — Care Management Important Message (Signed)
Important Message  Patient Details  Name: [name] [name] MRN: [id number] Date of Birth: 1940/08/16   Medicare Important Message Given:  Yes    Barb Merino [name] 05/10/2015, 12:57 PM

## 2015-05-10 NOTE — Progress Notes (Signed)
  Echocardiogram 2D Echocardiogram has been performed.  [name] [name] 05/10/2015, 10:03 AM

## 2015-05-10 NOTE — Progress Notes (Signed)
CONSULT NOTE - Follow Up Consult  Pharmacy Consult for coumadin.  Indication: afib  Allergies  Allergen Reactions  . Procaine Hcl Anaphylaxis    Patient Measurements: Height: 5\' 9"  (175.3 cm) Weight: 231 lb 1.6 oz (104.826 kg) (scale b) IBW/kg (Calculated) : 66.2  Vital Signs: Temp: 98 F (36.7 C) (03/10 0444) Temp Source: Oral (03/10 0444) BP: 144/87 mmHg (03/10 0444) Pulse Rate: 134 (03/10 0444)  Labs:  Recent Labs  05/08/15 0535 05/08/15 0745 05/09/15 0524 05/10/15 0433  HGB  --  10.1* 10.0*  --   HCT  --  31.0* 29.9*  --   PLT  --  182 193  --   LABPROT 23.9*  --  25.5* 29.2*  INR 2.15*  --  2.35* 2.82*  CREATININE  --  2.56* 1.92* 1.52*    Estimated Creatinine Clearance: 42.5 mL/min (by C-G formula based on Cr of 1.52).   Assessment: 75 yo female on coumadin for afib. INR 2.35 > 2.82. hgb 10, plt 193, stable. No bleeding noted per chart.  PTA coumadin: 1.25mg /d   Goal of Therapy:  INR 2-3 Monitor platelets by anticoagulation protocol: Yes  Phenytoin level= 10-20    Plan:  -Coumadin 1 mg po today -Daily PT/INR  Maryanna Shape, PharmD, BCPS  Clinical Pharmacist  Pager: [phone]    05/10/2015 10:40 AM

## 2015-05-10 NOTE — Progress Notes (Signed)
[facility] PROGRESS NOTE  [name] [name] [id number] DOB: 08/30/1940 DOA: 05/06/2015 PCP: [name], MD Brief narrative 75 year old female with history of A. fib on Coumadin, uncontrolled type 2 diabetes mellitus, seizures disorder, history of herpes cephalitis in 2013 brought to the ED with multiple episodes of nausea, vomiting and diarrhea with right lower quadrant abdominal pain. She was treated for UTI 1 week back. In the ED she was septic with tachycardia, accelerated hypertension, and significant leukocytosis. UA was positive for UTI. Placed on empiric Zosyn, Cardizem drip and admitted to step down unit.  Assessment/Plan: Sepsis secondary to acute pyelonephritis Both urine and blood culture growing Escherichia coli which is mostly sensitivite. Zosyn switched to Ancef. Once renal function returns to normal with placing a PICC line and discharge on Ancef for a total 2 weeks course.  Leukocytosis  improving. Remains afebrile.   Acute on chronic kidney disease stage II Baseline creatinine of 1-1.2. Possibly due to acute pyelonephritis and emphysematous cystitis. Now slowly improving. Continue IV fluids. Avoid nephrotoxins.  A. fib with RVR Likely triggered by sepsis with dehydration. Required Cardizem drip on admission. Now on Cardizem and metoprolol but patient going into rapid A. fib since yesterday evening. I've increased dose of both the medications and monitor on telemetry.  Continue Coumadin, dosing per pharmacy.  Essential hypertension Increased dose of Cardizem and metoprolol. Monitor for now.  Nausea vomiting and diarrhea Now resolved. No further diarrhea since admission.  Uncontrolled type 2 diabetes mellitus  on Lantus 50 units at bedtime with sliding cell coverage. CBG much controlled.  History of seizures and herpetic encephalitis Continue Dilantin.  Elevated troponin Suspect demand ischemia with underlying sepsis and AKI. Troponin peaked at 0.20.  Denied any chest pain symptoms but had some repolarization abnormality on EKG.  follow 2-D echo result.  DVT prophylaxis: On Coumadin  Diet: Diabetic  Code Status: Full code Family Communication: None at bedside. Spoke with husband on the phone on 3/9. Disposition Plan: Home possibly on 3/12 after renal function adequately improved, needs PICC line for IV antibiotics. Needs home health RN and PT.   Consultants:  None  Procedures:  CT abdomen and pelvis  Antibiotics:  IV Zosyn since 3/6  HPI/Subjective: Seen and examined.  Denies further right lower quadrant abdominal pain. Heart rate elevated to 120-140 overnight. Reports that her husband got admitted to the hospital this morning.  Objective: Filed Vitals:   05/09/15 2055 05/10/15 0444  BP: 147/95 144/87  Pulse: 125 134  Temp: 98.4 F (36.9 C) 98 F (36.7 C)  Resp: 22 20    Intake/Output Summary (Last 24 hours) at 05/10/15 1139 Last data filed at 05/10/15 [id number]  Gross per 24 hour  Intake    960 ml  Output    550 ml  Net    410 ml   Filed Weights   05/08/15 1639 05/09/15 0427 05/10/15 0444  Weight: 103.2 kg (227 lb 8.2 oz) 104.055 kg (229 lb 6.4 oz) 104.826 kg (231 lb 1.6 oz)    Exam:   General:   not in distress  HEENT: moist mucosa,   Chest: Clear bilaterally  CVS: S1 and S2 irregular, no murmurs rub or gallop   GI: Soft, nondistended, no abdominal tenderness, bowel sounds present  Musculoskeletal: Warm, no edema      Data Reviewed: Basic Metabolic Panel:  Recent Labs Lab 05/06/15 1438 05/07/15 0608 05/08/15 0745 05/09/15 0524 05/10/15 0433  NA 138 139 138 136 138  K 4.3 5.2* 4.8  4.0 3.7  CL 100* 100* 104 103 104  CO2 25 28 23 22 23   GLUCOSE 346* 416* 200* 202* 154*  BUN 18 28* 45* 38* 24*  CREATININE 1.50* 2.15* 2.56* 1.92* 1.52*  CALCIUM 8.4* 8.7* 8.2* 8.4* 8.3*   Liver Function Tests:  Recent Labs Lab 05/06/15 1438 05/07/15 0608  AST 22 28  ALT 12* 13*  ALKPHOS 120 102   BILITOT 0.7 0.5  PROT 6.5 5.8*  ALBUMIN 2.8* 2.4*    Recent Labs Lab 05/07/15 0349  LIPASE 15   No results for input(s): AMMONIA in the last 168 hours. CBC:  Recent Labs Lab 05/06/15 1438 05/07/15 0608 05/08/15 0745 05/09/15 0524  WBC 21.2* 23.6* 19.9* 14.7*  NEUTROABS 18.1* 20.8*  --   --   HGB 12.0 10.5* 10.1* 10.0*  HCT 37.0 32.6* 31.0* 29.9*  MCV 91.1 92.4 93.9 91.7  PLT 250 210 182 193   Cardiac Enzymes:  Recent Labs Lab 05/06/15 1438 05/06/15 2146 05/06/15 2345 05/07/15 0608 05/07/15 1033  TROPONINI 0.03 0.06* 0.14* 0.20* 0.15*   BNP (last 3 results) No results for input(s): BNP in the last 8760 hours.  ProBNP (last 3 results) No results for input(s): PROBNP in the last 8760 hours.  CBG:  Recent Labs Lab 05/09/15 1145 05/09/15 1629 05/09/15 2128 05/10/15 0550 05/10/15 1126  GLUCAP 281* 144* 116* 141* 167*    Recent Results (from the past 240 hour(s))  Urine culture     Status: None   Collection Time: 05/06/15  6:38 PM  Result Value Ref Range Status   Specimen Description URINE, CLEAN CATCH  Final   Special Requests NONE  Final   Culture >=100,000 COLONIES/mL ESCHERICHIA COLI  Final   Report Status 05/08/2015 FINAL  Final   Organism ID, Bacteria ESCHERICHIA COLI  Final      Susceptibility   Escherichia coli - MIC*    AMPICILLIN >=32 RESISTANT Resistant     CEFAZOLIN <=4 SENSITIVE Sensitive     CEFTRIAXONE <=1 SENSITIVE Sensitive     CIPROFLOXACIN 0.5 SENSITIVE Sensitive     GENTAMICIN <=1 SENSITIVE Sensitive     IMIPENEM <=0.25 SENSITIVE Sensitive     NITROFURANTOIN <=16 SENSITIVE Sensitive     TRIMETH/SULFA >=320 RESISTANT Resistant     AMPICILLIN/SULBACTAM 16 INTERMEDIATE Intermediate     PIP/TAZO <=4 SENSITIVE Sensitive     * >=100,000 COLONIES/mL ESCHERICHIA COLI  Blood culture (routine x 2)     Status: None   Collection Time: 05/06/15  9:20 PM  Result Value Ref Range Status   Specimen Description BLOOD RIGHT ANTECUBITAL   Final   Special Requests BOTTLES DRAWN AEROBIC AND ANAEROBIC 5CC  Final   Culture  Setup Time   Final    GRAM NEGATIVE RODS IN BOTH AEROBIC AND ANAEROBIC BOTTLES CRITICAL RESULT CALLED TO, READ BACK BY AND VERIFIED WITH: C [place] 05/07/15 @ 29 M VESTAL    Culture   Final    ESCHERICHIA COLI SUSCEPTIBILITIES PERFORMED ON PREVIOUS CULTURE WITHIN THE LAST 5 DAYS.    Report Status 05/09/2015 FINAL  Final  Blood culture (routine x 2)     Status: None   Collection Time: 05/06/15  9:25 PM  Result Value Ref Range Status   Specimen Description BLOOD RIGHT HAND  Final   Special Requests BOTTLES DRAWN AEROBIC AND ANAEROBIC 5CC  Final   Culture  Setup Time   Final    GRAM NEGATIVE RODS IN BOTH AEROBIC AND ANAEROBIC BOTTLES CRITICAL RESULT CALLED  TO, READ BACK BY AND VERIFIED WITH: C [place]CE[place] 05/07/15 @ [place]  Final   Report Status 05/09/2015 FINAL  Final   Organism ID, Bacteria ESCHERICHIA COLI  Final      Susceptibility   Escherichia coli - MIC*    AMPICILLIN >=32 RESISTANT Resistant     CEFAZOLIN <=4 SENSITIVE Sensitive     CEFEPIME <=1 SENSITIVE Sensitive     CEFTAZIDIME <=1 SENSITIVE Sensitive     CEFTRIAXONE <=1 SENSITIVE Sensitive     CIPROFLOXACIN <=0.25 SENSITIVE Sensitive     GENTAMICIN <=1 SENSITIVE Sensitive     IMIPENEM <=0.25 SENSITIVE Sensitive     TRIMETH/SULFA >=320 RESISTANT Resistant     AMPICILLIN/SULBACTAM 16 INTERMEDIATE Intermediate     PIP/TAZO <=4 SENSITIVE Sensitive     * ESCHERICHIA COLI  MRSA PCR Screening     Status: None   Collection Time: 05/07/15  1:58 AM  Result Value Ref Range Status   MRSA by PCR NEGATIVE NEGATIVE Final    Comment:        The GeneXpert MRSA Assay (FDA approved for NASAL specimens only), is one component of a comprehensive MRSA colonization surveillance program. It is not intended to diagnose MRSA infection nor to guide or monitor treatment for MRSA infections.      Studies: No results  found.  Scheduled Meds: . atorvastatin  40 mg Oral QHS  .  ceFAZolin (ANCEF) IV  2 g Intravenous 3 times per day  . diltiazem  360 mg Oral Daily  . escitalopram  20 mg Oral Daily  . insulin aspart  0-15 Units Subcutaneous TID WC  . insulin aspart protamine- aspart  50 Units Subcutaneous Q breakfast  . metoprolol tartrate  50 mg Oral BID  . pantoprazole  40 mg Oral Daily  . phenytoin  300 mg Oral Daily  . warfarin  1 mg Oral ONCE-1800  . Warfarin - Pharmacist Dosing Inpatient   Does not apply q1800   Continuous Infusions: . sodium chloride 100 mL/hr at 05/09/15 1933      Time spent: 25 minutes    Sumire Halbleib, [place]  [facility] Pager [phone]. If 7PM-7AM, please contact night-coverage at [email or web address], password [facility] 05/10/2015, 11:39 AM  LOS: 4 days

## 2015-05-10 NOTE — Progress Notes (Signed)
109 MD notified that pt had 3 episodes of soft to loose stool all day no bad malodor noted No  further order given Pt monitored

## 2015-05-11 ENCOUNTER — Encounter (HOSPITAL_COMMUNITY): Payer: Self-pay | Admitting: Internal Medicine

## 2015-05-11 DIAGNOSIS — N179 Acute kidney failure, unspecified: Secondary | ICD-10-CM | POA: Diagnosis present

## 2015-05-11 DIAGNOSIS — A4151 Sepsis due to Escherichia coli [E. coli]: Principal | ICD-10-CM

## 2015-05-11 DIAGNOSIS — E1040 Type 1 diabetes mellitus with diabetic neuropathy, unspecified: Secondary | ICD-10-CM

## 2015-05-11 DIAGNOSIS — N189 Chronic kidney disease, unspecified: Secondary | ICD-10-CM

## 2015-05-11 DIAGNOSIS — E876 Hypokalemia: Secondary | ICD-10-CM

## 2015-05-11 DIAGNOSIS — I5020 Unspecified systolic (congestive) heart failure: Secondary | ICD-10-CM

## 2015-05-11 DIAGNOSIS — R7881 Bacteremia: Secondary | ICD-10-CM

## 2015-05-11 HISTORY — DX: Unspecified systolic (congestive) heart failure: I50.20

## 2015-05-11 LAB — PROTIME-INR
INR: 3.6 — AB (ref 0.00–1.49)
PROTHROMBIN TIME: 35.1 s — AB (ref 11.6–15.2)

## 2015-05-11 LAB — RENAL FUNCTION PANEL
ANION GAP: 14 (ref 5–15)
Albumin: 2.6 g/dL — ABNORMAL LOW (ref 3.5–5.0)
BUN: 18 mg/dL (ref 6–20)
CALCIUM: 8.7 mg/dL — AB (ref 8.9–10.3)
CHLORIDE: 103 mmol/L (ref 101–111)
CO2: 23 mmol/L (ref 22–32)
CREATININE: 1.28 mg/dL — AB (ref 0.44–1.00)
GFR, EST AFRICAN AMERICAN: 47 mL/min — AB (ref >60)
GFR, EST NON AFRICAN AMERICAN: 40 mL/min — AB (ref >60)
Glucose, Bld: 152 mg/dL — ABNORMAL HIGH (ref 65–99)
Phosphorus: 2.3 mg/dL — ABNORMAL LOW (ref 2.5–4.6)
Potassium: 3.4 mmol/L — ABNORMAL LOW (ref 3.5–5.1)
Sodium: 140 mmol/L (ref 135–145)

## 2015-05-11 LAB — GLUCOSE, CAPILLARY
GLUCOSE-CAPILLARY: 148 mg/dL — AB (ref 65–99)
Glucose-Capillary: 197 mg/dL — ABNORMAL HIGH (ref 65–99)

## 2015-05-11 MED ORDER — METOPROLOL TARTRATE 75 MG PO TABS: 75.0000 mg | ORAL_TABLET | Freq: Two times a day (BID) | ORAL | Status: DC

## 2015-05-11 MED ORDER — CEPHALEXIN 500 MG PO CAPS: 500.0000 mg | ORAL_CAPSULE | Freq: Four times a day (QID) | ORAL | Status: DC

## 2015-05-11 MED ORDER — POTASSIUM CHLORIDE CRYS ER 20 MEQ PO TBCR
40.0000 meq | EXTENDED_RELEASE_TABLET | Freq: Once | ORAL | Status: AC
  Administered 2015-05-11: 40 meq via ORAL
  Filled 2015-05-11: qty 2

## 2015-05-11 MED ORDER — DILTIAZEM HCL ER COATED BEADS 360 MG PO CP24: 360.0000 mg | ORAL_CAPSULE | Freq: Every day | ORAL | Status: DC

## 2015-05-11 MED ORDER — SACCHAROMYCES BOULARDII 250 MG PO CAPS: 250.0000 mg | ORAL_CAPSULE | Freq: Two times a day (BID) | ORAL | Status: DC

## 2015-05-11 MED ORDER — METOPROLOL TARTRATE 50 MG PO TABS
75.0000 mg | ORAL_TABLET | Freq: Two times a day (BID) | ORAL | Status: DC
  Administered 2015-05-11: 75 mg via ORAL
  Filled 2015-05-11: qty 1

## 2015-05-11 NOTE — Discharge Instructions (Addendum)
Pyelonephritis, Adult Pyelonephritis is a kidney infection. The kidneys are organs that help clean your blood by moving waste out of your blood and into your pee (urine). This infection can happen quickly, or it can last for a long time. In most cases, it clears up with treatment and does not cause other problems. HOME CARE Medicines  Take over-the-counter and prescription medicines only as told by your doctor.  Take your antibiotic medicine as told by your doctor. Do not stop taking the medicine even if you start to feel better. General Instructions  Drink enough fluid to keep your pee clear or pale yellow.  Avoid caffeine, tea, and carbonated drinks.  Pee (urinate) often. Avoid holding in pee for long periods of time.     Pee before and after sex.  After pooping (having a bowel movement), women should wipe from front to back. Use each tissue only once.  Keep all follow-up visits as told by your doctor. This is important. GET HELP IF:  You do not feel better after 2 days.  Your symptoms get worse.  You have a fever. GET HELP RIGHT AWAY IF:  You cannot take your medicine or drink fluids as told.  You have chills and shaking.  You throw up (vomit).  You have very bad pain in your side (flank) or back.  You feel very weak or you pass out (faint).   This information is not intended to replace advice given to you by your health care provider. Make sure you discuss any questions you have with your health care provider.   Document Released: 03/26/2004 Document Revised: 11/07/2014 Document Reviewed: 06/11/2014 Elsevier Interactive Patient Education 2016 [employer]   Heart Failure Heart failure is a condition in which the heart has trouble pumping blood. This means your heart does not pump blood efficiently for your body to work well. In some cases of heart failure, fluid may back up into your lungs or you may have swelling (edema) in your lower legs. Heart failure is  usually a long-term (chronic) condition. It is important for you to take good care of yourself and follow your health care provider's treatment plan. CAUSES  Some health conditions can cause heart failure. Those health conditions include:  High blood pressure (hypertension). Hypertension causes the heart muscle to work harder than normal. When pressure in the blood vessels is high, the heart needs to pump (contract) with more force in order to circulate blood throughout the body. High blood pressure eventually causes the heart to become stiff and weak.  Coronary artery disease (CAD). CAD is the buildup of cholesterol and fat (plaque) in the arteries of the heart. The blockage in the arteries deprives the heart muscle of oxygen and blood. This can cause chest pain and may lead to a heart attack. High blood pressure can also contribute to CAD.  Heart attack (myocardial infarction). A heart attack occurs when one or more arteries in the heart become blocked. The loss of oxygen damages the muscle tissue of the heart. When this happens, part of the heart muscle dies. The injured tissue does not contract as well and weakens the heart's ability to pump blood.  Abnormal heart valves. When the heart valves do not open and close properly, it can cause heart failure. This makes the heart muscle pump harder to keep the blood flowing.  Heart muscle disease (cardiomyopathy or myocarditis). Heart muscle disease is damage to the heart muscle from a variety of causes. These can include drug  or alcohol abuse, infections, or unknown reasons. These can increase the risk of heart failure.  Lung disease. Lung disease makes the heart work harder because the lungs do not work properly. This can cause a strain on the heart, leading it to fail.  Diabetes. Diabetes increases the risk of heart failure. High blood sugar contributes to high fat (lipid) levels in the blood. Diabetes can also cause slow damage to tiny blood vessels  that carry important nutrients to the heart muscle. When the heart does not get enough oxygen and food, it can cause the heart to become weak and stiff. This leads to a heart that does not contract efficiently.  Other conditions can contribute to heart failure. These include abnormal heart rhythms, thyroid problems, and low blood counts (anemia). Certain unhealthy behaviors can increase the risk of heart failure, including:  Being overweight.  Smoking or chewing tobacco.  Eating foods high in fat and cholesterol.  Abusing illicit drugs or alcohol.  Lacking physical activity. SYMPTOMS  Heart failure symptoms may vary and can be hard to detect. Symptoms may include:  Shortness of breath with activity, such as climbing stairs.  Persistent cough.  Swelling of the feet, ankles, legs, or abdomen.  Unexplained weight gain.  Difficulty breathing when lying flat (orthopnea).  Waking from sleep because of the need to sit up and get more air.  Rapid heartbeat.  Fatigue and loss of energy.  Feeling light-headed, dizzy, or close to fainting.  Loss of appetite.  Nausea.  Increased urination during the night (nocturia). DIAGNOSIS  A diagnosis of heart failure is based on your history, symptoms, physical examination, and diagnostic tests. Diagnostic tests for heart failure may include:  Echocardiography.  Electrocardiography.  Chest X-ray.  Blood tests.  Exercise stress test.  Cardiac angiography.  Radionuclide scans. TREATMENT  Treatment is aimed at managing the symptoms of heart failure. Medicines, behavioral changes, or surgical intervention may be necessary to treat heart failure.  Medicines to help treat heart failure may include:  Angiotensin-converting enzyme (ACE) inhibitors. This type of medicine blocks the effects of a blood protein called angiotensin-converting enzyme. ACE inhibitors relax (dilate) the blood vessels and help lower blood pressure.  Angiotensin  receptor blockers (ARBs). This type of medicine blocks the actions of a blood protein called angiotensin. Angiotensin receptor blockers dilate the blood vessels and help lower blood pressure.  Water pills (diuretics). Diuretics cause the kidneys to remove salt and water from the blood. The extra fluid is removed through urination. This loss of extra fluid lowers the volume of blood the heart pumps.  Beta blockers. These prevent the heart from beating too fast and improve heart muscle strength.  Digitalis. This increases the force of the heartbeat.  Healthy behavior changes include:  Obtaining and maintaining a healthy weight.  Stopping smoking or chewing tobacco.  Eating heart-healthy foods.  Limiting or avoiding alcohol.  Stopping illicit drug use.  Physical activity as directed by your health care provider.  Surgical treatment for heart failure may include:  A procedure to open blocked arteries, repair damaged heart valves, or remove damaged heart muscle tissue.  A pacemaker to improve heart muscle function and control certain abnormal heart rhythms.  An internal cardioverter defibrillator to treat certain serious abnormal heart rhythms.  A left ventricular assist device (LVAD) to assist the pumping ability of the heart. HOME CARE INSTRUCTIONS   Take medicines only as directed by your health care provider. Medicines are important in reducing the workload of your heart,  slowing the progression of heart failure, and improving your symptoms.  Do not stop taking your medicine unless directed by your health care provider.  Do not skip any dose of medicine.  Refill your prescriptions before you run out of medicine. Your medicines are needed every day.  Engage in moderate physical activity if directed by your health care provider. Moderate physical activity can benefit some people. The elderly and people with severe heart failure should consult with a health care provider for  physical activity recommendations.  Eat heart-healthy foods. Food choices should be free of trans fat and low in saturated fat, cholesterol, and salt (sodium). Healthy choices include fresh or frozen fruits and vegetables, fish, lean meats, legumes, fat-free or low-fat dairy products, and whole grain or high fiber foods. Talk to a dietitian to learn more about heart-healthy foods.  Limit sodium if directed by your health care provider. Sodium restriction may reduce symptoms of heart failure in some people. Talk to a dietitian to learn more about heart-healthy seasonings.  Use healthy cooking methods. Healthy cooking methods include roasting, grilling, broiling, baking, poaching, steaming, or stir-frying. Talk to a dietitian to learn more about healthy cooking methods.  Limit fluids if directed by your health care provider. Fluid restriction may reduce symptoms of heart failure in some people.  Weigh yourself every day. Daily weights are important in the early recognition of excess fluid. You should weigh yourself every morning after you urinate and before you eat breakfast. Wear the same amount of clothing each time you weigh yourself. Record your daily weight. Provide your health care provider with your weight record.  Monitor and record your blood pressure if directed by your health care provider.  Check your pulse if directed by your health care provider.  Lose weight if directed by your health care provider. Weight loss may reduce symptoms of heart failure in some people.  Stop smoking or chewing tobacco. Nicotine makes your heart work harder by causing your blood vessels to constrict. Do not use nicotine gum or patches before talking to your health care provider.  Keep all follow-up visits as directed by your health care provider. This is important.  Limit alcohol intake to no more than 1 drink per day for nonpregnant women and 2 drinks per day for men. One drink equals 12 ounces of beer,  5 ounces of wine, or 1 ounces of hard liquor. Drinking more than that is harmful to your heart. Tell your health care provider if you drink alcohol several times a week. Talk with your health care provider about whether alcohol is safe for you. If your heart has already been damaged by alcohol or you have severe heart failure, drinking alcohol should be stopped completely.  Stop illicit drug use.  Stay up-to-date with immunizations. It is especially important to prevent respiratory infections through current pneumococcal and influenza immunizations.  Manage other health conditions such as hypertension, diabetes, thyroid disease, or abnormal heart rhythms as directed by your health care provider.  Learn to manage stress.  Plan rest periods when fatigued.  Learn strategies to manage high temperatures. If the weather is extremely hot:  Avoid vigorous physical activity.  Use air conditioning or fans or seek a cooler location.  Avoid caffeine and alcohol.  Wear loose-fitting, lightweight, and light-colored clothing.  Learn strategies to manage cold temperatures. If the weather is extremely cold:  Avoid vigorous physical activity.  Layer clothes.  Wear mittens or gloves, a hat, and a scarf when going outside.  Avoid alcohol.  Obtain ongoing education and support as needed.  Participate in or seek rehabilitation as needed to maintain or improve independence and quality of life. SEEK MEDICAL CARE IF:   You have a rapid weight gain.  You have increasing shortness of breath that is unusual for you.  You are unable to participate in your usual physical activities.  You tire easily.  You cough more than normal, especially with physical activity.  You have any or more swelling in areas such as your hands, feet, ankles, or abdomen.  You are unable to sleep because it is hard to breathe.  You feel like your heart is beating fast (palpitations).  You become dizzy or light-headed  upon standing up. SEEK IMMEDIATE MEDICAL CARE IF:   You have difficulty breathing.  There is a change in mental status such as decreased alertness or difficulty with concentration.  You have a pain or discomfort in your chest.  You have an episode of fainting (syncope). MAKE SURE YOU:   Understand these instructions.  Will watch your condition.  Will get help right away if you are not doing well or get worse.   This information is not intended to replace advice given to you by your health care provider. Make sure you discuss any questions you have with your health care provider.   Document Released: 02/16/2005 Document Revised: 07/03/2014 Document Reviewed: 03/18/2012 Elsevier Interactive Patient Education [employer].

## 2015-05-11 NOTE — Progress Notes (Signed)
CONSULT NOTE - Follow Up Consult  Pharmacy Consult for coumadin/phenytoin.  Indication: afib/seizures  Allergies  Allergen Reactions  . Procaine Hcl Anaphylaxis    Patient Measurements: Height: 5\' 9"  (175.3 cm) Weight: 230 lb 1.6 oz (104.373 kg) (scale b) IBW/kg (Calculated) : 66.2  Vital Signs: Temp: 98.2 F (36.8 C) (03/11 0612) Temp Source: Oral (03/11 0612) BP: 180/88 mmHg (03/11 0612) Pulse Rate: 77 (03/11 0612)  Labs:  Recent Labs  05/08/15 0745 05/09/15 0524 05/10/15 0433 05/11/15 0535  HGB 10.1* 10.0*  --   --   HCT 31.0* 29.9*  --   --   PLT 182 193  --   --   LABPROT  --  25.5* 29.2* 35.1*  INR  --  2.35* 2.82* 3.60*  CREATININE 2.56* 1.92* 1.52* 1.28*    Estimated Creatinine Clearance: 50.4 mL/min (by C-G formula based on Cr of 1.28).   Assessment: 74 yo female on coumadin for afib and phenytoin for seizures. INR 2.35 > 2.82 > 3.6. hgb 10, plt 193, stable.  PTA coumadin: 1.25mg /d   Goal of Therapy:  INR 2-3 Monitor platelets by anticoagulation protocol: Yes  Phenytoin level= 10-20    Plan:  -Hold warfarin tonight -Daily PT/INR -Continue phenytoin ER 300 mg, will check levels at [facility] -No updated hgb and plts, will order CBC  Angela Burke, PharmD Pharmacy Resident Pager: [phone]  05/11/2015 7:12 AM

## 2015-05-11 NOTE — Discharge Summary (Signed)
Physician Discharge Summary  [name] [name] HA:[id number] DOB: [birth year] DOA: 05/06/2015  PCP: [name], MD  Admit date: 05/06/2015 Discharge date: 05/11/2015  Time spent: 35 minutes  Recommendations for Outpatient Follow-up:  1. Discharge home with [facility] and PT 2. Completes 2 weeks of antibiotics on 05/19/2015. Please check renal function during outpt visit and if normal start ACEi inhibitor. 3. Cardiology will arrange outpatient  follow up in 1 week.  Discharge Diagnoses:  Principal Problem:   Sepsis due to Escherichia coli (E. 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)   Pyelonephritis   Sepsis ([place])   Atrial fibrillation with RVR (HCC)   SYSTOLIC  CHF, UNCLEAR ACUTE VS CHRONIC  ACUTE ON CHRONIC   Nausea vomiting and diarrhea   Bacteremia due to Escherichia coli   Acute kidney injury ([place])   Uncontrolled hypertension   Hypokalemia   Discharge Condition: fair  Diet recommendation: heart healthy  Code status: full code  [facility] Weights   05/09/15 0427 05/10/15 0444 05/11/15 0612  Weight: 104.055 kg (229 lb 6.4 oz) 104.826 kg (231 lb 1.6 oz) 104.373 kg (230 lb 1.6 oz)    History of present illness:  75 year old female with history of A. fib on Coumadin, uncontrolled type 2 diabetes mellitus, seizures disorder, history of herpes cephalitis in 2013 brought to the ED with multiple episodes of nausea, vomiting and diarrhea with right lower quadrant abdominal pain. She was treated for UTI 1 week back. In the ED she was septic with tachycardia, accelerated hypertension, and significant leukocytosis. UA was positive for UTI. Placed on empiric Zosyn, Cardizem drip and admitted to step down unit.  Hospital Course:  Sepsis secondary to acute pyelonephritis Both urine and blood culture growing Escherichia coli which is mostly sensitivite. Zosyn switched to Ancef. Leukocytosis improving. Remains afebrile. Has received 6 days  of abx , will discharge on oral keflex to complete 14 days of abx. Did not use quinolones risk of  prolonging INR. Follow up with PCP in 1 week.   Acute on chronic kidney disease stage II Baseline creatinine of 1-1.2. Possibly due to acute pyelonephritis and emphysematous cystitis. Now slowly improving. Follow labs in 1 week.  A. fib with RVR Likely triggered by sepsis with dehydration. Required Cardizem drip on admission. Now on Cardizem and metoprolol . Rate much better controlled after increasing dose of both ( metoprolol to 75 mg bid and Cardizem to 360 mg daily). She will be discharged on this. Continue coumadin upon discharge.  Continue Coumadin..  Essential hypertension Increased dose of Cardizem and metoprolol. Follow as outpt  Nausea vomiting and diarrhea Now resolved. No further diarrhea since admission.  CHF, acute vs chronic vs acute on chronic, elevated troponin Suspect demand ischemia with underlying sepsis and AKI. Troponin peaked at 0.20. Denied any chest pain symptoms. 2D echo showed low EF of 25-30 % .  some repolarization abnormality on EKG. no recent echo in the system. had cardiac cath in 2007 with mild disease. patient remains asymptomatic and euvolemic. Continue BB and statin. Cannot use ACEi or ARB due to improving renal function and should be used as outpt once renal function normalizes. i discussed with her that she should stay in the hospital and be evaluated by cardiology, but patient insists on going home as she feels fine and her husband also got discharged home from the hospital today. -she will need outpatient cardiology follow up in 1 week time. Was seeing Dr Lovena Le  in the past for her afib. - i dont see any acute cardiac issue with her and her echo findings and rapid afib are likely related to sepsis. - i have spoke with cardiology PA Rhonda barrett who will arrange follow up in the office within 1 week.    Uncontrolled type 2 diabetes mellitus on Lantus  50 units at bedtime with sliding cell coverage.   History of seizures and herpetic encephalitis Continue Dilantin.      Family Communication: None at bedside. Spoke with husband on the phone on 3/9. Disposition Plan: Home with HHRN and PT   Consultants:  None  Procedures:  CT abdomen and pelvis  Antibiotics:  IV Zosyn since 3/6  Discharge Exam: Filed Vitals:   05/11/15 0612 05/11/15 1251  BP: 180/88 175/70  Pulse: 77 70  Temp: 98.2 F (36.8 C) 98.6 F (37 C)  Resp: 16 18     General: not in distress  HEENT: moist mucosa,   Chest: Clear bilaterally  CVS: S1 and S2 irregular, no murmurs rub or gallop  GI: Soft, nondistended, no abdominal tenderness, bowel sounds present  Musculoskeletal: Warm, no edema  CNS: Alert and oriented  Discharge Instructions    Current Discharge Medication List    START taking these medications   Details  cephALEXin (KEFLEX) 500 MG capsule Take 1 capsule (500 mg total) by mouth 4 (four) times daily. Qty: 36 capsule, Refills: 0    diltiazem (CARDIZEM CD) 360 MG 24 hr capsule Take 1 capsule (360 mg total) by mouth daily. Qty: 30 capsule, Refills: 0    saccharomyces boulardii (FLORASTOR) 250 MG capsule Take 1 capsule (250 mg total) by mouth 2 (two) times daily. Qty: 30 capsule, Refills: 0      CONTINUE these medications which have CHANGED   Details  metoprolol tartrate 75 MG TABS Take 75 mg by mouth 2 (two) times daily. Qty: 60 tablet, Refills: 0      CONTINUE these medications which have NOT CHANGED   Details  atorvastatin (LIPITOR) 40 MG tablet Take 1 tablet (40 mg total) by mouth at bedtime. Qty: 30 tablet, Refills: 8    calcium-vitamin D (OSCAL WITH D) 500-200 MG-UNIT per tablet Take 1 tablet by mouth daily.    escitalopram (LEXAPRO) 10 MG tablet Take 2 tablets (20 mg total) by mouth daily. Qty: 90 tablet, Refills: 2    LORazepam (ATIVAN) 0.5 MG tablet Take 0.5 mg by mouth as needed for anxiety.     Multiple Vitamins-Minerals (CENTRUM SILVER ADULT 50+ PO) Take 1 tablet by mouth every morning.    NOVOLIN 70/30 RELION (70-30) 100 UNIT/ML injection INJECT 50 UNITS SUBCUTANEOUSLY ONCE DAILY WITH BREAKFAST Qty: 20 mL, Refills: 2    phenytoin (DILANTIN) 100 MG ER capsule Take 3 capsules (300 mg total) by mouth daily. Qty: 90 capsule, Refills: 5    warfarin (COUMADIN) 2.5 MG tablet Take 1.25 mg by mouth daily.      STOP taking these medications     diltiazem (DILACOR XR) 240 MG 24 hr capsule      augmented betamethasone dipropionate (DIPROLENE-AF) 0.05 % cream        Allergies  Allergen Reactions  . Procaine Hcl Anaphylaxis   Follow-up Information    Follow up with [name], MD. Schedule an appointment as soon as possible for a visit in 1 week.   Specialty:  Internal Medicine   Contact information:   [facility] [address] [place] [address] [phone]  The results of significant diagnostics from this hospitalization (including imaging, microbiology, ancillary and laboratory) are listed below for reference.    Significant Diagnostic Studies: Ct Abdomen Pelvis W Contrast  05/06/2015  CLINICAL DATA:  Nausea, vomiting and diarrhea. Patient fell this morning with decreased blood pressure. History of diabetes and hypertension. EXAM: CT ABDOMEN AND PELVIS WITH CONTRAST TECHNIQUE: Multidetector CT imaging of the abdomen and pelvis was performed using the standard protocol following bolus administration of intravenous contrast. CONTRAST:  56mL OMNIPAQUE IOHEXOL 300 MG/ML  SOLN COMPARISON:  CT 03/06/2015. FINDINGS: Lower chest: Stable atelectasis or scarring in both lung bases. Cardiomegaly and coronary artery calcifications are noted. There is no pleural or pericardial effusion. Hepatobiliary: The liver has a stable appearance without focal abnormality. Multiple small calcified gallstones are again noted. There is no gallbladder wall thickening or significant biliary  dilatation. Pancreas: The pancreas appears unchanged with diffuse atrophy and multiple small cystic lesions, measuring up to 1.7 cm in the pancreatic tail on image number 27. No surrounding inflammatory change or pancreatic ductal dilatation. Spleen: Normal in size without focal abnormality. Adrenals/Urinary Tract: The adrenal glands appear stable without suspicious findings. No renal cortical scarring and atrophy are again noted, right-greater-than-left. There are several low-density renal lesions which are grossly stable. There is new mild dilatation of the right renal pelvis with associated wall thickening and mildly delayed contrast excretion. There is also new asymmetric perinephric soft tissue stranding on the right. No ureteral dilatation or urinary tract calculus is seen. There is new air within the urinary bladder lumen. Some of this air may be contained within the bladder wall. No air is seen within the kidneys or ureters. There are no inflammatory changes surrounding the bladder. Stomach/Bowel: No evidence of bowel wall thickening, distention or surrounding inflammatory change. The appendix appears normal. There is mild distal colonic diverticulosis. Vascular/Lymphatic: There are no enlarged abdominal or pelvic lymph nodes. There is diffuse atherosclerosis of the aorta, its branches and the iliac arteries. Reproductive: Unremarkable.  No evidence of adnexal mass. Other: No generalized ascites or free air. The anterior abdominal wall appears unremarkable. Musculoskeletal: No acute or significant osseous findings. There are stable degenerative changes throughout the spine associated with a mild convex left scoliosis. Previous right proximal femoral ORIF. Chronic T11 compression deformity appears unchanged. IMPRESSION: 1. Interval change in the appearance of the right kidney with moderate perinephric soft tissue stranding, pelvic wall thickening and delayed contrast excretion. No evidence of obstructing  calculus. These findings could be secondary to a recently passed calculus, obstruction from previously suspected papillary necrosis or pyelonephritis. 2. Air within the urinary bladder lumen and possibly the right bladder wall. Emphysematous cystitis should be considered in this diabetic if bladder instrumentation has not been recently performed. Correlation with urine analysis recommended. 3. Cholelithiasis without evidence of cholecystitis or biliary dilatation. 4. 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 Please refer to follow up recommendations issued 2 months ago. Electronically Signed   By: Richardean Sale M.D.   On: 05/06/2015 16:57   Dg Chest Port 1 View  05/07/2015  CLINICAL DATA:  Sepsis, UTI, abdominal pain EXAM: PORTABLE CHEST 1 VIEW COMPARISON:  08/23/2014 and 11/21/2012 FINDINGS: Cardiomegaly again noted. Chronic elevation of the right hemidiaphragm. Central mild vascular congestion and mild interstitial prominence bilateral without convincing pulmonary edema. Stable old fracture of the right sixth rib. No segmental infiltrate. Mild basilar atelectasis. Suboptimal study due to patient's large body habitus IMPRESSION: Central mild vascular congestion and mild interstitial prominence bilaterally  without convincing pulmonary edema. Suboptimal study due to patient's large body habitus. Chronic elevation of the right hemidiaphragm. No segmental infiltrate. Mild basilar atelectasis. Electronically Signed   By: Lahoma Crocker M.D.   On: 05/07/2015 07:55    Microbiology: Recent Results (from the past 240 hour(s))  Urine culture     Status: None   Collection Time: 05/06/15  6:38 PM  Result Value Ref Range Status   Specimen Description URINE, CLEAN CATCH  Final   Special Requests NONE  Final   Culture >=100,000 COLONIES/mL ESCHERICHIA COLI  Final   Report Status 05/08/2015 FINAL  Final   Organism ID, Bacteria ESCHERICHIA COLI  Final      Susceptibility   Escherichia coli -  MIC*    AMPICILLIN >=32 RESISTANT Resistant     CEFAZOLIN <=4 SENSITIVE Sensitive     CEFTRIAXONE <=1 SENSITIVE Sensitive     CIPROFLOXACIN 0.5 SENSITIVE Sensitive     GENTAMICIN <=1 SENSITIVE Sensitive     IMIPENEM <=0.25 SENSITIVE Sensitive     NITROFURANTOIN <=16 SENSITIVE Sensitive     TRIMETH/SULFA >=320 RESISTANT Resistant     AMPICILLIN/SULBACTAM 16 INTERMEDIATE Intermediate     PIP/TAZO <=4 SENSITIVE Sensitive     * >=100,000 COLONIES/mL ESCHERICHIA COLI  Blood culture (routine x 2)     Status: None   Collection Time: 05/06/15  9:20 PM  Result Value Ref Range Status   Specimen Description BLOOD RIGHT ANTECUBITAL  Final   Special Requests BOTTLES DRAWN AEROBIC AND ANAEROBIC 5CC  Final   Culture  Setup Time   Final    GRAM NEGATIVE RODS IN BOTH AEROBIC AND ANAEROBIC BOTTLES CRITICAL RESULT CALLED TO, READ BACK BY AND VERIFIED WITH: C [place] 05/07/15 @ 29 M VESTAL    Culture   Final    ESCHERICHIA COLI SUSCEPTIBILITIES PERFORMED ON PREVIOUS CULTURE WITHIN THE LAST 5 DAYS.    Report Status 05/09/2015 FINAL  Final  Blood culture (routine x 2)     Status: None   Collection Time: 05/06/15  9:25 PM  Result Value Ref Range Status   Specimen Description BLOOD RIGHT HAND  Final   Special Requests BOTTLES DRAWN AEROBIC AND ANAEROBIC 5CC  Final   Culture  Setup Time   Final    GRAM NEGATIVE RODS IN BOTH AEROBIC AND ANAEROBIC BOTTLES CRITICAL RESULT CALLED TO, READ BACK BY AND VERIFIED WITH: C [place] 05/07/15 @ [place]  Final   Report Status 05/09/2015 FINAL  Final   Organism ID, Bacteria ESCHERICHIA COLI  Final      Susceptibility   Escherichia coli - MIC*    AMPICILLIN >=32 RESISTANT Resistant     CEFAZOLIN <=4 SENSITIVE Sensitive     CEFEPIME <=1 SENSITIVE Sensitive     CEFTAZIDIME <=1 SENSITIVE Sensitive     CEFTRIAXONE <=1 SENSITIVE Sensitive     CIPROFLOXACIN <=0.25 SENSITIVE Sensitive     GENTAMICIN <=1 SENSITIVE Sensitive     IMIPENEM  <=0.25 SENSITIVE Sensitive     TRIMETH/SULFA >=320 RESISTANT Resistant     AMPICILLIN/SULBACTAM 16 INTERMEDIATE Intermediate     PIP/TAZO <=4 SENSITIVE Sensitive     * ESCHERICHIA COLI  MRSA PCR Screening     Status: None   Collection Time: 05/07/15  1:58 AM  Result Value Ref Range Status   MRSA by PCR NEGATIVE NEGATIVE Final    Comment:        The GeneXpert MRSA Assay (FDA approved for NASAL specimens  only), is one component of a comprehensive MRSA colonization surveillance program. It is not intended to diagnose MRSA infection nor to guide or monitor treatment for MRSA infections.      Labs: Basic Metabolic Panel:  Recent Labs Lab 05/07/15 0608 05/08/15 0745 05/09/15 0524 05/10/15 0433 05/11/15 0535  NA 139 138 136 138 140  K 5.2* 4.8 4.0 3.7 3.4*  CL 100* 104 103 104 103  CO2 28 23 22 23 23   GLUCOSE 416* 200* 202* 154* 152*  BUN 28* 45* 38* 24* 18  CREATININE 2.15* 2.56* 1.92* 1.52* 1.28*  CALCIUM 8.7* 8.2* 8.4* 8.3* 8.7*  PHOS  --   --   --   --  2.3*   Liver Function Tests:  Recent Labs Lab 05/06/15 1438 05/07/15 0608 05/11/15 0535  AST 22 28  --   ALT 12* 13*  --   ALKPHOS 120 102  --   BILITOT 0.7 0.5  --   PROT 6.5 5.8*  --   ALBUMIN 2.8* 2.4* 2.6*    Recent Labs Lab 05/07/15 0349  LIPASE 15   No results for input(s): AMMONIA in the last 168 hours. CBC:  Recent Labs Lab 05/06/15 1438 05/07/15 0608 05/08/15 0745 05/09/15 0524  WBC 21.2* 23.6* 19.9* 14.7*  NEUTROABS 18.1* 20.8*  --   --   HGB 12.0 10.5* 10.1* 10.0*  HCT 37.0 32.6* 31.0* 29.9*  MCV 91.1 92.4 93.9 91.7  PLT 250 210 182 193   Cardiac Enzymes:  Recent Labs Lab 05/06/15 1438 05/06/15 2146 05/06/15 2345 05/07/15 0608 05/07/15 1033  TROPONINI 0.03 0.06* 0.14* 0.20* 0.15*   BNP: BNP (last 3 results) No results for input(s): BNP in the last 8760 hours.  ProBNP (last 3 results) No results for input(s): PROBNP in the last 8760 hours.  CBG:  Recent Labs Lab  05/10/15 1655 05/10/15 1821 05/10/15 2030 05/11/15 0613 05/11/15 1109  GLUCAP 65 152* 168* 148* 197*       Signed:  Louellen Molder MD.  [facility] 05/11/2015, 3:35 PM

## 2015-05-11 NOTE — Progress Notes (Signed)
Pt c/o slight SOB. 02 2lnc started. Sat-100%.pulse=69. Assisted to bed.SR on tele.observed closely

## 2015-05-13 ENCOUNTER — Telehealth: Payer: Self-pay | Admitting: *Deleted

## 2015-05-13 NOTE — Telephone Encounter (Signed)
Transition Care Management Follow-up Telephone Call  PCP: [name], MD  Admit date: 05/06/2015 Discharge date: 05/11/2015  Recommendations for Outpatient Follow-up:  1. Discharge home with [facility] and PT 2. Completes 2 weeks of antibiotics on 05/19/2015. Please check renal function during outpt visit and if normal start ACEi inhibitor. 3. Cardiology will arrange outpatient follow up in 1 week.  Discharge Diagnoses:  Principal Problem:  Sepsis due to Escherichia coli (E. 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   Per pt's husband: "Yesterday she was a little blah. Once she got a good night's sleep and good food in her stomach, she perked up a little."   Per pt: "I'm feeling great, the only problem is walking because they had me so I couldn't walk for a week. The only time I did any walking was to the bathroom and back, other than that my legs are just weak."   Do you understand why you were in the hospital? yes   Do you understand the discharge instructions? yes   Where were you discharged to? Home   Items Reviewed:  Medications reviewed: yes, pt was started on Florastor per chart review and given paper rx; pt and her husband state they did not receive this rx on discharge so they have not been able to fill it. Pt's husband, who participated in med review, also reported that pt is taking Cipro and Macrobid, which were not listed on her med list (added for accuracy). He is unsure if these are new or old prescriptions. Her  Cardizem dose was increased to 360 mg daily on discharge, but this medication is cost prohibitive for pt, so she is continuing to take her previous dose of 240 mg daily.  Allergies reviewed: yes  Dietary changes reviewed: yes  Referrals reviewed: no, none made   Functional Questionnaire:   Activities of Daily Living (ADLs):   She states they are independent in the following: bathing and hygiene, feeding, continence, grooming, toileting and dressing States they require assistance with the following: ambulation, pt reports her legs are very weak from hospitalization and it is a little difficult for her to get from floor to floor in her house (she reports she lives in a 3 story house); she states she has someone coming over later today to help her (unclear if home health PT or other caregiver)   Any transportation issues/concerns?: no, pt states her husband will drive her to her appt   Any patient concerns? no   Confirmed importance and date/time of follow-up visits scheduled yes  Provider Appointment booked with Dr. [name] 05/20/15 @ 11:30am  Confirmed with patient if condition begins to worsen call PCP or go to the ER.  Patient was given the office number and encouraged to call back with question or concerns.  : yes, pt states they have office number ready if needed

## 2015-05-13 NOTE — Telephone Encounter (Signed)
thx

## 2015-05-16 ENCOUNTER — Telehealth: Payer: Self-pay | Admitting: Internal Medicine

## 2015-05-16 ENCOUNTER — Emergency Department (HOSPITAL_BASED_OUTPATIENT_CLINIC_OR_DEPARTMENT_OTHER): Payer: Medicare Other

## 2015-05-16 ENCOUNTER — Encounter: Payer: Self-pay | Admitting: Medical

## 2015-05-16 ENCOUNTER — Inpatient Hospital Stay (HOSPITAL_BASED_OUTPATIENT_CLINIC_OR_DEPARTMENT_OTHER)
Admission: EM | Admit: 2015-05-16 | Discharge: 2015-05-21 | DRG: 250 | Disposition: A | Discharge status: Home / Self Care | Payer: Medicare Other | Attending: Internal Medicine | Admitting: Internal Medicine

## 2015-05-16 ENCOUNTER — Ambulatory Visit (INDEPENDENT_AMBULATORY_CARE_PROVIDER_SITE_OTHER): Payer: Medicare Other | Admitting: Medical

## 2015-05-16 ENCOUNTER — Encounter (HOSPITAL_BASED_OUTPATIENT_CLINIC_OR_DEPARTMENT_OTHER): Payer: Self-pay

## 2015-05-16 VITALS — BP 130/86 | HR 100 | Temp 97.5°F | Ht 69.0 in | Wt 227.8 lb

## 2015-05-16 DIAGNOSIS — E785 Hyperlipidemia, unspecified: Secondary | ICD-10-CM | POA: Diagnosis present

## 2015-05-16 DIAGNOSIS — E871 Hypo-osmolality and hyponatremia: Secondary | ICD-10-CM

## 2015-05-16 DIAGNOSIS — Z7901 Long term (current) use of anticoagulants: Secondary | ICD-10-CM

## 2015-05-16 DIAGNOSIS — I482 Chronic atrial fibrillation: Secondary | ICD-10-CM | POA: Diagnosis present

## 2015-05-16 DIAGNOSIS — I2510 Atherosclerotic heart disease of native coronary artery without angina pectoris: Secondary | ICD-10-CM

## 2015-05-16 DIAGNOSIS — Z09 Encounter for follow-up examination after completed treatment for conditions other than malignant neoplasm: Secondary | ICD-10-CM

## 2015-05-16 DIAGNOSIS — G40909 Epilepsy, unspecified, not intractable, without status epilepticus: Secondary | ICD-10-CM

## 2015-05-16 DIAGNOSIS — I081 Rheumatic disorders of both mitral and tricuspid valves: Secondary | ICD-10-CM | POA: Diagnosis present

## 2015-05-16 DIAGNOSIS — N179 Acute kidney failure, unspecified: Secondary | ICD-10-CM

## 2015-05-16 DIAGNOSIS — E11319 Type 2 diabetes mellitus with unspecified diabetic retinopathy without macular edema: Secondary | ICD-10-CM | POA: Diagnosis present

## 2015-05-16 DIAGNOSIS — F419 Anxiety disorder, unspecified: Secondary | ICD-10-CM

## 2015-05-16 DIAGNOSIS — K625 Hemorrhage of anus and rectum: Secondary | ICD-10-CM | POA: Diagnosis present

## 2015-05-16 DIAGNOSIS — Z87440 Personal history of urinary (tract) infections: Secondary | ICD-10-CM | POA: Diagnosis not present

## 2015-05-16 DIAGNOSIS — A4151 Sepsis due to Escherichia coli [E. coli]: Secondary | ICD-10-CM

## 2015-05-16 DIAGNOSIS — M791 Myalgia, unspecified site: Secondary | ICD-10-CM

## 2015-05-16 DIAGNOSIS — N390 Urinary tract infection, site not specified: Secondary | ICD-10-CM

## 2015-05-16 DIAGNOSIS — K219 Gastro-esophageal reflux disease without esophagitis: Secondary | ICD-10-CM | POA: Diagnosis present

## 2015-05-16 DIAGNOSIS — R6883 Chills (without fever): Secondary | ICD-10-CM

## 2015-05-16 DIAGNOSIS — B952 Enterococcus as the cause of diseases classified elsewhere: Secondary | ICD-10-CM | POA: Diagnosis present

## 2015-05-16 DIAGNOSIS — N12 Tubulo-interstitial nephritis, not specified as acute or chronic: Secondary | ICD-10-CM | POA: Diagnosis present

## 2015-05-16 DIAGNOSIS — R7881 Bacteremia: Secondary | ICD-10-CM

## 2015-05-16 DIAGNOSIS — H5442 Blindness, left eye, normal vision right eye: Secondary | ICD-10-CM | POA: Diagnosis present

## 2015-05-16 DIAGNOSIS — M8580 Other specified disorders of bone density and structure, unspecified site: Secondary | ICD-10-CM | POA: Diagnosis present

## 2015-05-16 DIAGNOSIS — B9620 Unspecified Escherichia coli [E. coli] as the cause of diseases classified elsewhere: Secondary | ICD-10-CM

## 2015-05-16 DIAGNOSIS — I4891 Unspecified atrial fibrillation: Secondary | ICD-10-CM | POA: Diagnosis not present

## 2015-05-16 DIAGNOSIS — E1159 Type 2 diabetes mellitus with other circulatory complications: Secondary | ICD-10-CM

## 2015-05-16 DIAGNOSIS — I5021 Acute systolic (congestive) heart failure: Secondary | ICD-10-CM | POA: Diagnosis not present

## 2015-05-16 DIAGNOSIS — E118 Type 2 diabetes mellitus with unspecified complications: Secondary | ICD-10-CM

## 2015-05-16 DIAGNOSIS — I5023 Acute on chronic systolic (congestive) heart failure: Secondary | ICD-10-CM | POA: Diagnosis present

## 2015-05-16 DIAGNOSIS — R8271 Bacteriuria: Secondary | ICD-10-CM | POA: Diagnosis not present

## 2015-05-16 DIAGNOSIS — R0602 Shortness of breath: Secondary | ICD-10-CM

## 2015-05-16 DIAGNOSIS — Z0000 Encounter for general adult medical examination without abnormal findings: Secondary | ICD-10-CM

## 2015-05-16 DIAGNOSIS — R109 Unspecified abdominal pain: Secondary | ICD-10-CM

## 2015-05-16 DIAGNOSIS — Z5181 Encounter for therapeutic drug level monitoring: Secondary | ICD-10-CM | POA: Diagnosis not present

## 2015-05-16 DIAGNOSIS — R112 Nausea with vomiting, unspecified: Secondary | ICD-10-CM

## 2015-05-16 DIAGNOSIS — I110 Hypertensive heart disease with heart failure: Secondary | ICD-10-CM

## 2015-05-16 DIAGNOSIS — K5900 Constipation, unspecified: Secondary | ICD-10-CM

## 2015-05-16 DIAGNOSIS — R5383 Other fatigue: Secondary | ICD-10-CM | POA: Diagnosis not present

## 2015-05-16 DIAGNOSIS — I214 Non-ST elevation (NSTEMI) myocardial infarction: Principal | ICD-10-CM | POA: Diagnosis present

## 2015-05-16 DIAGNOSIS — I481 Persistent atrial fibrillation: Secondary | ICD-10-CM | POA: Diagnosis not present

## 2015-05-16 DIAGNOSIS — Z87891 Personal history of nicotine dependence: Secondary | ICD-10-CM | POA: Diagnosis not present

## 2015-05-16 DIAGNOSIS — E876 Hypokalemia: Secondary | ICD-10-CM

## 2015-05-16 DIAGNOSIS — I10 Essential (primary) hypertension: Secondary | ICD-10-CM

## 2015-05-16 DIAGNOSIS — R413 Other amnesia: Secondary | ICD-10-CM

## 2015-05-16 DIAGNOSIS — I5022 Chronic systolic (congestive) heart failure: Secondary | ICD-10-CM

## 2015-05-16 DIAGNOSIS — Z794 Long term (current) use of insulin: Secondary | ICD-10-CM

## 2015-05-16 DIAGNOSIS — E119 Type 2 diabetes mellitus without complications: Secondary | ICD-10-CM

## 2015-05-16 DIAGNOSIS — I480 Paroxysmal atrial fibrillation: Secondary | ICD-10-CM

## 2015-05-16 DIAGNOSIS — S72141A Displaced intertrochanteric fracture of right femur, initial encounter for closed fracture: Secondary | ICD-10-CM

## 2015-05-16 DIAGNOSIS — Z9861 Coronary angioplasty status: Secondary | ICD-10-CM

## 2015-05-16 DIAGNOSIS — R197 Diarrhea, unspecified: Secondary | ICD-10-CM

## 2015-05-16 DIAGNOSIS — F32A Depression, unspecified: Secondary | ICD-10-CM

## 2015-05-16 DIAGNOSIS — Z8661 Personal history of infections of the central nervous system: Secondary | ICD-10-CM

## 2015-05-16 DIAGNOSIS — I255 Ischemic cardiomyopathy: Secondary | ICD-10-CM

## 2015-05-16 DIAGNOSIS — R531 Weakness: Secondary | ICD-10-CM | POA: Diagnosis present

## 2015-05-16 DIAGNOSIS — F329 Major depressive disorder, single episode, unspecified: Secondary | ICD-10-CM

## 2015-05-16 DIAGNOSIS — Z87448 Personal history of other diseases of urinary system: Secondary | ICD-10-CM

## 2015-05-16 DIAGNOSIS — E1040 Type 1 diabetes mellitus with diabetic neuropathy, unspecified: Secondary | ICD-10-CM

## 2015-05-16 DIAGNOSIS — I2583 Coronary atherosclerosis due to lipid rich plaque: Secondary | ICD-10-CM

## 2015-05-16 LAB — POC URINALSYSI DIPSTICK (AUTOMATED)
Bilirubin, UA: NEGATIVE
Blood, UA: NEGATIVE
Glucose, UA: NEGATIVE
Ketones, UA: NEGATIVE
NITRITE UA: NEGATIVE
PH UA: 7.5
PROTEIN UA: NEGATIVE
Spec Grav, UA: <=1.005
Urobilinogen, UA: 0.2

## 2015-05-16 LAB — COMPREHENSIVE METABOLIC PANEL
ALBUMIN: 2.9 g/dL — AB (ref 3.5–5.0)
ALK PHOS: 101 U/L (ref 38–126)
ALT: 22 U/L (ref 14–54)
AST: 163 U/L — AB (ref 15–41)
Anion gap: 13 (ref 5–15)
BILIRUBIN TOTAL: 0.5 mg/dL (ref 0.3–1.2)
BUN: 11 mg/dL (ref 6–20)
CALCIUM: 8.2 mg/dL — AB (ref 8.9–10.3)
CO2: 27 mmol/L (ref 22–32)
CREATININE: 0.96 mg/dL (ref 0.44–1.00)
Chloride: 96 mmol/L — ABNORMAL LOW (ref 101–111)
GFR calc Af Amer: >60 mL/min (ref >60)
GFR calc non Af Amer: 57 mL/min — ABNORMAL LOW (ref >60)
GLUCOSE: 166 mg/dL — AB (ref 65–99)
Potassium: 3.2 mmol/L — ABNORMAL LOW (ref 3.5–5.1)
SODIUM: 136 mmol/L (ref 135–145)
TOTAL PROTEIN: 7.1 g/dL (ref 6.5–8.1)

## 2015-05-16 LAB — CBC WITH DIFFERENTIAL/PLATELET
BASOS ABS: 0 10*3/uL (ref 0.0–0.1)
BASOS PCT: 0 %
EOS ABS: 0.3 10*3/uL (ref 0.0–0.7)
Eosinophils Relative: 2 %
HCT: 34.5 % — ABNORMAL LOW (ref 36.0–46.0)
HEMOGLOBIN: 11 g/dL — AB (ref 12.0–15.0)
LYMPHS ABS: 2.3 10*3/uL (ref 0.7–4.0)
LYMPHS PCT: 15 %
MCH: 29.7 pg (ref 26.0–34.0)
MCHC: 31.9 g/dL (ref 30.0–36.0)
MCV: 93.2 fL (ref 78.0–100.0)
Monocytes Absolute: 0.8 10*3/uL (ref 0.1–1.0)
Monocytes Relative: 5 %
NEUTROS ABS: 12.1 10*3/uL — AB (ref 1.7–7.7)
Neutrophils Relative %: 78 %
PLATELETS: 458 10*3/uL — AB (ref 150–400)
RBC: 3.7 MIL/uL — ABNORMAL LOW (ref 3.87–5.11)
RDW: 14 % (ref 11.5–15.5)
WBC: 15.5 10*3/uL — ABNORMAL HIGH (ref 4.0–10.5)

## 2015-05-16 LAB — POCT GLUCOSE (DEVICE FOR HOME USE)
Glucose Fasting, POC: 98 mg/dL (ref 70–99)
POC GLUCOSE: 98 mg/dL (ref 70–99)

## 2015-05-16 LAB — URINALYSIS, ROUTINE W REFLEX MICROSCOPIC
BILIRUBIN URINE: NEGATIVE
GLUCOSE, UA: NEGATIVE mg/dL
HGB URINE DIPSTICK: NEGATIVE
Ketones, ur: NEGATIVE mg/dL
Nitrite: NEGATIVE
Protein, ur: NEGATIVE mg/dL
SPECIFIC GRAVITY, URINE: 1.008 (ref 1.005–1.030)
pH: 7.5 (ref 5.0–8.0)

## 2015-05-16 LAB — URINE MICROSCOPIC-ADD ON

## 2015-05-16 LAB — PROTIME-INR
INR: 1.54 — ABNORMAL HIGH (ref 0.00–1.49)
Prothrombin Time: 18.5 seconds — ABNORMAL HIGH (ref 11.6–15.2)

## 2015-05-16 LAB — TROPONIN I: TROPONIN I: 44.57 ng/mL — AB (ref <0.031)

## 2015-05-16 LAB — GLUCOSE, CAPILLARY: GLUCOSE-CAPILLARY: 156 mg/dL — AB (ref 65–99)

## 2015-05-16 MED ORDER — IOHEXOL 300 MG/ML  SOLN
50.0000 mL | Freq: Once | INTRAMUSCULAR | Status: AC | PRN
  Administered 2015-05-16: 50 mL via ORAL

## 2015-05-16 MED ORDER — DEXTROSE 5 % IV SOLN
1.0000 g | Freq: Once | INTRAVENOUS | Status: AC
  Administered 2015-05-16: 1 g via INTRAVENOUS
  Filled 2015-05-16: qty 10

## 2015-05-16 MED ORDER — HEPARIN BOLUS VIA INFUSION
4000.0000 [IU] | Freq: Once | INTRAVENOUS | Status: AC
  Administered 2015-05-16: 4000 [IU] via INTRAVENOUS

## 2015-05-16 MED ORDER — HEPARIN (PORCINE) IN NACL 100-0.45 UNIT/ML-% IJ SOLN
1300.0000 [IU]/h | INTRAMUSCULAR | Status: DC
  Administered 2015-05-16: 1300 [IU]/h via INTRAVENOUS
  Filled 2015-05-16: qty 250

## 2015-05-16 MED ORDER — ASPIRIN 81 MG PO CHEW
324.0000 mg | CHEWABLE_TABLET | Freq: Once | ORAL | Status: AC
Start: 2015-05-16 — End: 2015-05-16
  Administered 2015-05-16: 324 mg via ORAL
  Filled 2015-05-16: qty 4

## 2015-05-16 MED ORDER — DILTIAZEM HCL 100 MG IV SOLR: 5.0000 mg/h | Freq: Once | INTRAVENOUS | Status: DC

## 2015-05-16 NOTE — Telephone Encounter (Signed)
Spoke w/ Dr. Larose Kells, chart reviewed, Pt refused to see cardiology at the hospital. Per Dr. Larose Kells, she needs to go to ED, if she absolutely refuses, she needs to be seen here. Called Lori at [facility] and informed her of advice, which she agrees. She will call Pt and inform her to go to ED, she will have Pt call office or she will call me back directly if Pt refuses to go to ED.

## 2015-05-16 NOTE — Telephone Encounter (Signed)
Patient scheduled for 05/16/15 with Percell Miller due to elevated BP

## 2015-05-16 NOTE — Progress Notes (Signed)
Subjective:    Patient ID: [name] [name], female    DOB: 1940-12-22, 75 y.o.   MRN: [id number]  HPI  Pt in for some recent uti/pyelonephritis. Pt had uti on 05-06-2015. Pt had culture and uti ws over 100,000. Pt went to Dr. Rip Harbour office and she was given antibiotics. Later worsened and was admitted to hospital.  Pt later fell around March 6 after feeling weak. Then she went to ED and they admitted her.  I tried to see Dr. Royce Macadamia office visit and office visit for Dr. Larose Kells on March, 6, 2017.  But could not find.  She was discharged on March 11 th.  Pt denies any burning or frequent urination.   Pt states last bowel movement was May 07, 2016.Pt never had any abdomen or pelvic surgeries.   Pt states her sugar was 121 this am.  Pt feels fatigued and chills presently.  Some muscle aches.  Pt belches some on exam.    Review of Systems  Constitutional: Positive for fatigue. Negative for fever and chills.  Respiratory: Negative for chest tightness and shortness of breath.   Gastrointestinal: Positive for constipation. Negative for nausea, vomiting, abdominal pain, diarrhea, blood in stool, abdominal distention and rectal pain.  Genitourinary: Negative for dysuria, urgency, frequency and flank pain.  Musculoskeletal: Positive for back pain.       Myalgias.  Skin: Negative for rash.  Neurological: Negative for dizziness and headaches.  Hematological: Negative for adenopathy. Does not bruise/bleed easily.  Psychiatric/Behavioral: Negative for confusion, dysphoric mood and agitation.    Past Medical History  Diagnosis Date  . Blindness of left eye   . Eye muscle weakness     Right eye weakness after cataract surgery  . Common migraine     ?hx of  . DIABETES MELLITUS, TYPE II 03/27/2006    dr [name]  . HYPERLIPIDEMIA 03/27/2006  . 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 HYPERTENSION 03/27/2006  . Atrial fibrillation ([place])     SVT dx 2007, cath 2007 mild CAD, then had  an cardioversion, ablation; still on coumadin , has occ palpitation, EKG 03-2010 NSR  . Headache(784.0) 05/07/2009  . LUNG NODULE 09/01/2006    Excision, Bx Benighn  . Osteopenia 2004    Dexa 2004 showed Osteopenia, DEXA 03/2007 normal  . Seizures ([place])   . Intertrochanteric fracture of right hip ([place]) 07/13/2012  . 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 GERD (gastroesophageal reflux disease) 10/05/2011  . Recurrent urinary tract infection     Seeing Urology  . 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[place]) Q000111Q    Social History   Social History  . Marital Status: Married    Spouse Name: Jenny Reichmann  . Number of Children: 1  . Years of Education: College   Occupational History  . retired     Social History Main Topics  . Smoking status: Former Smoker    Types: Cigarettes    Quit date: 03/02/1978  . Smokeless tobacco: Never Used  . Alcohol Use: No  . Drug Use: No  . Sexual Activity: No   Other Topics Concern  . Not on file   Social History Narrative   Patient lives at home spouse.   Moved from [place] 2004   1 son, problems w/ drugs, at home         Past Surgical History  Procedure Laterality Date  . Shoulder surgery    . Femur im nail Right 07/15/2012    Procedure: INTRAMEDULLARY (IM) NAIL HIP;  Surgeon: Lenetta Quaker  Mardelle Matte, MD;  Location: [facility];  Service: Orthopedics;  Laterality: Right;    Family History  Problem Relation Age of Onset  . Diabetes Father   . Heart attack Father 54  . Diabetes Sister   . Cancer Neg Hx     no hx of colon or breast cancer    Allergies  Allergen Reactions  . Procaine Hcl Anaphylaxis    Current Outpatient Prescriptions on File Prior to Visit  Medication Sig Dispense Refill  . atorvastatin (LIPITOR) 40 MG tablet Take 1 tablet (40 mg total) by mouth at bedtime. 30 tablet 8  . betamethasone dipropionate (DIPROLENE) 0.05 % cream Apply topically as needed.    . calcium-vitamin D (OSCAL WITH D) 500-200 MG-UNIT per tablet Take 1 tablet by mouth daily.    . cephALEXin (KEFLEX) 500  MG capsule Take 1 capsule (500 mg total) by mouth 4 (four) times daily. 36 capsule 0  . ciprofloxacin (CIPRO) 500 MG tablet Take 500 mg by mouth.    . diltiazem (CARDIZEM CD) 360 MG 24 hr capsule Take 1 capsule (360 mg total) by mouth daily. (Patient taking differently: Take 240 mg by mouth daily. ) 30 capsule 0  . escitalopram (LEXAPRO) 10 MG tablet Take 2 tablets (20 mg total) by mouth daily. 90 tablet 2  . LORazepam (ATIVAN) 0.5 MG tablet Take 0.5 mg by mouth as needed for anxiety.    . metoprolol tartrate 75 MG TABS Take 75 mg by mouth 2 (two) times daily. 60 tablet 0  . Multiple Vitamins-Minerals (CENTRUM SILVER ADULT 50+ PO) Take 1 tablet by mouth every morning.    . nitrofurantoin (MACRODANTIN) 100 MG capsule Take 100 mg by mouth.    [name] NOVOLIN 70/30 RELION (70-30) 100 UNIT/ML injection INJECT 50 UNITS SUBCUTANEOUSLY ONCE DAILY WITH BREAKFAST 20 mL 2  . phenytoin (DILANTIN) 100 MG ER capsule Take 3 capsules (300 mg total) by mouth daily. 90 capsule 5  . saccharomyces boulardii (FLORASTOR) 250 MG capsule Take 1 capsule (250 mg total) by mouth 2 (two) times daily. 30 capsule 0  . warfarin (COUMADIN) 2.5 MG tablet Take 1.25 mg by mouth daily.     No current facility-administered medications on file prior to visit.    BP 130/86 mmHg  Pulse 100  Temp(Src) 97.5 F (36.4 C) (Oral)  Ht 5\' 9"  (1.753 m)  Wt 227 lb 12.8 oz (103.329 kg)  BMI 33.62 kg/m2  SpO2 98%       Objective:   Physical Exam  General Appearance- Not in acute distress.  HEENT Eyes- Scleraeral/Conjuntiva-bilat- Not Yellow. Mouth & Throat- Normal.  Chest and Lung Exam Auscultation: Breath sounds:-Normal. Adventitious sounds:- No Adventitious sounds.  Cardiovascular Auscultation:Rythm - Regular. Heart Sounds -Normal heart sounds.  Abdomen Inspection:-Inspection Normal.  Palpation/Perucssion: Palpation and Percussion of the abdomen reveal- Non Tender, No Rebound tenderness, No rigidity(Guarding) and No  Palpable abdominal masses. No bowel sounds heard. Liver:-Normal.  Spleen:- Normal.   Back- no cva tenderness. But reports diffuse back pain mild.  .     Assessment & Plan:  With your recent constipation for 8 days and no bm I do think you need stat abd xray and maybe ct of abdomen.  With you recent hypokalemia, I do think you this labs done stat.  In addition with fatigue, chills and onset of back pain rapid flu test needs to be done but we are out of test.  In addition with you recent pyelnoephritis and sepsis/hospitalization a cbc would be helpful  as well.  I called the ED physician and notified him that you are on your way down for evaluation.

## 2015-05-16 NOTE — ED Notes (Signed)
Pt placed on auto vitals Q30. Patient placed on cardiac monitor.  

## 2015-05-16 NOTE — ED Provider Notes (Signed)
CSN: [id number]     Arrival date & time 05/16/15  1833 History  By signing my name below, I, Dora Sims, attest that this documentation has been prepared under the direction and in the presence of physician practitioner, Quintella Reichert, MD,. Electronically Signed: Dora Sims, Scribe. 05/16/2015. 6:55 PM.    Chief Complaint  Patient presents with  . Weakness    The history is provided by the patient and the spouse. No language interpreter was used.     HPI Comments: [name] [name] is a 75 y.o. female with h/o GERD, CHF, HTN, HLD, and DM who presents to the Emergency Department complaining of sudden onset, constant, chills and weakness since this morning. She endorses associated bilateral leg swelling, wheezing, pain underneath her bilateral breasts, and mild back pain as well. She states that she was hospitalized last week for kidney issues; she was diagnosed with a bacterial infection. She notes that during her time in the hospital she was constipated and unable to have a bowel movement; she was administered stool softeners with no relief. She has not taken anything for constipation at home. Pt reports that before admission to the hospital, she had two days of constant diarrhea. She was discharged from the hospital five days ago with antibiotics and is still using them. Pt states that she is urinating normally. Pt does not remember the last time she was able to have a bowel movement. She also states that she ate a full meal last night. Pt has no h/o abdominal surgery. Pt uses Dilantin and Coumadin. She denies dysuria, sensation loss, cough, nausea, vomiting, or any other associated symptoms.   Past Medical History  Diagnosis Date  . Blindness of left eye   . Eye muscle weakness     Right eye weakness after cataract surgery  . Common migraine     ?hx of  . DIABETES MELLITUS, TYPE II 03/27/2006    dr [name]  . HYPERLIPIDEMIA 03/27/2006  . 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  HYPERTENSION 03/27/2006  . Atrial fibrillation ([place])     SVT dx 2007, cath 2007 mild CAD, then had an cardioversion, ablation; still on coumadin , has occ palpitation, EKG 03-2010 NSR  . Headache(784.0) 05/07/2009  . LUNG NODULE 09/01/2006    Excision, Bx Benighn  . Osteopenia 2004    Dexa 2004 showed Osteopenia, DEXA 03/2007 normal  . Seizures ([place])   . Intertrochanteric fracture of right hip ([place]) 07/13/2012  . 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 GERD (gastroesophageal reflux disease) 10/05/2011  . Recurrent urinary tract infection     Seeing Urology  . 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[place]) Q000111Q   Past Surgical History  Procedure Laterality Date  . Shoulder surgery    . Femur im nail Right 07/15/2012    Procedure: INTRAMEDULLARY (IM) NAIL HIP;  Surgeon: Johnny Bridge, MD;  Location: [name];  Service: Orthopedics;  Laterality: Right;   Family History  Problem Relation Age of Onset  . Diabetes Father   . Heart attack Father 47  . Diabetes Sister   . Cancer Neg Hx     no hx of colon or breast cancer   Social History  Substance Use Topics  . Smoking status: Former Smoker    Types: Cigarettes    Quit date: 03/02/1978  . Smokeless tobacco: Never Used  . Alcohol Use: No   OB History    No data available     Review of Systems  Constitutional: Positive for chills.  Respiratory: Positive  for wheezing. Negative for cough.   Gastrointestinal: Positive for constipation. Negative for nausea and vomiting.  Genitourinary: Negative for dysuria.  Musculoskeletal: Positive for back pain (mild), joint swelling (bilateral legs) and arthralgias (pain undernearh bilateral breasts).  Neurological: Positive for weakness. Negative for numbness.  All other systems reviewed and are negative.     Allergies  Procaine hcl  Home Medications   Prior to Admission medications   Medication Sig Start Date End Date Taking? Authorizing Provider  atorvastatin (LIPITOR) 40 MG tablet Take 1 tablet (40 mg total) by mouth at  bedtime. 08/07/14   Colon Branch, MD  betamethasone dipropionate (DIPROLENE) 0.05 % cream Apply topically as needed.    Historical Provider, MD  calcium-vitamin D (OSCAL WITH D) 500-200 MG-UNIT per tablet Take 1 tablet by mouth daily.    Historical Provider, MD  cephALEXin (KEFLEX) 500 MG capsule Take 1 capsule (500 mg total) by mouth 4 (four) times daily. 05/11/15 05/19/15  Nishant Dhungel, MD  ciprofloxacin (CIPRO) 500 MG tablet Take 500 mg by mouth.    Historical Provider, MD  diltiazem (CARDIZEM CD) 360 MG 24 hr capsule Take 1 capsule (360 mg total) by mouth daily. Patient taking differently: Take 240 mg by mouth daily.  05/11/15   Nishant Dhungel, MD  escitalopram (LEXAPRO) 10 MG tablet Take 2 tablets (20 mg total) by mouth daily. 11/28/14   Garvin Fila, MD  LORazepam (ATIVAN) 0.5 MG tablet Take 0.5 mg by mouth as needed for anxiety.    Historical Provider, MD  metoprolol tartrate 75 MG TABS Take 75 mg by mouth 2 (two) times daily. 05/11/15   Nishant Dhungel, MD  Multiple Vitamins-Minerals (CENTRUM SILVER ADULT 50+ PO) Take 1 tablet by mouth every morning.    Historical Provider, MD  nitrofurantoin (MACRODANTIN) 100 MG capsule Take 100 mg by mouth.    Historical Provider, MD  NOVOLIN 70/30 RELION (70-30) 100 UNIT/ML injection INJECT 50 UNITS SUBCUTANEOUSLY ONCE DAILY WITH BREAKFAST 01/29/15   Renato Shin, MD  phenytoin (DILANTIN) 100 MG ER capsule Take 3 capsules (300 mg total) by mouth daily. 11/26/14   Colon Branch, MD  saccharomyces boulardii (FLORASTOR) 250 MG capsule Take 1 capsule (250 mg total) by mouth 2 (two) times daily. 05/11/15   Nishant Dhungel, MD  warfarin (COUMADIN) 2.5 MG tablet Take 1.25 mg by mouth daily.    Historical Provider, MD   BP 126/81 mmHg  Pulse 51  Temp(Src) 97.9 F (36.6 C) (Oral)  Resp 22  Ht 5\' 9"  (1.753 m)  Wt 225 lb 12.8 oz (102.422 kg)  BMI 33.33 kg/m2  SpO2 96% Physical Exam  Constitutional: She is oriented to person, place, and time. She appears  well-developed and well-nourished.  Well appearing  HENT:  Head: Normocephalic and atraumatic.  Cardiovascular: Normal rate and regular rhythm.   No murmur heard. Pulmonary/Chest: Effort normal and breath sounds normal. No respiratory distress.  Abdominal: Soft. There is no tenderness. There is no rebound and no guarding.  Genitourinary:  Empty rectal vaults Non tender rectal exam  Musculoskeletal: She exhibits no edema or tenderness.  2+-3+ pitting edema bialteral lower extremities  Neurological: She is alert and oriented to person, place, and time.  Skin: Skin is warm and dry.  Psychiatric: She has a normal mood and affect. Her behavior is normal.  Nursing note and vitals reviewed.   ED Course  Procedures (including critical care time) CRITICAL CARE Performed by: Quintella Reichert   Total critical care time: 45 minutes  Critical care time was exclusive of separately billable procedures and treating other patients.  Critical care was necessary to treat or prevent imminent or life-threatening deterioration.  Critical care was time spent personally by me on the following activities: development of treatment plan with patient and/or surrogate as well as nursing, discussions with consultants, evaluation of patient's response to treatment, examination of patient, obtaining history from patient or surrogate, ordering and performing treatments and interventions, ordering and review of laboratory studies, ordering and review of radiographic studies, pulse oximetry and re-evaluation of patient's condition.   DIAGNOSTIC STUDIES: Oxygen Saturation is 99% on RA, normal by my interpretation.    COORDINATION OF CARE: 7:13 PM Will order CT Abdomen Pelvis w/o Contrast. Will order blood work. Will order ED EKG, cardiac monitoring, saline lock IV, and will check rectal temperature. Discussed treatment plan with pt at bedside and pt agreed to plan.   Labs Review Labs Reviewed  COMPREHENSIVE  METABOLIC PANEL - Abnormal; Notable for the following:    Potassium 3.2 (*)    Chloride 96 (*)    Glucose, Bld 166 (*)    Calcium 8.2 (*)    Albumin 2.9 (*)    AST 163 (*)    GFR calc non Af Amer 57 (*)    All other components within normal limits  CBC WITH DIFFERENTIAL/PLATELET - Abnormal; Notable for the following:    WBC 15.5 (*)    RBC 3.70 (*)    Hemoglobin 11.0 (*)    HCT 34.5 (*)    Platelets 458 (*)    Neutro Abs 12.1 (*)    All other components within normal limits  URINALYSIS, ROUTINE W REFLEX MICROSCOPIC (NOT AT [facility]) - Abnormal; Notable for the following:    Leukocytes, UA SMALL (*)    All other components within normal limits  PROTIME-INR - Abnormal; Notable for the following:    Prothrombin Time 18.5 (*)    INR 1.54 (*)    All other components within normal limits  TROPONIN I - Abnormal; Notable for the following:    Troponin I 44.57 (*)    All other components within normal limits  URINE MICROSCOPIC-ADD ON - Abnormal; Notable for the following:    Squamous Epithelial / LPF 0-5 (*)    Bacteria, UA MANY (*)    All other components within normal limits  GLUCOSE, CAPILLARY - Abnormal; Notable for the following:    Glucose-Capillary 156 (*)    All other components within normal limits  URINE CULTURE  HEPARIN LEVEL (UNFRACTIONATED)  CBC  PROTIME-INR  TROPONIN I  TROPONIN I  TROPONIN I  COMPREHENSIVE METABOLIC PANEL  CBC WITH DIFFERENTIAL/PLATELET    Imaging Review Ct Abdomen Pelvis Wo Contrast  05/16/2015  CLINICAL DATA:  Belching and constipation. EXAM: CT ABDOMEN AND PELVIS WITHOUT CONTRAST TECHNIQUE: Multidetector CT imaging of the abdomen and pelvis was performed following the standard protocol without IV contrast. COMPARISON:  05/06/2015 FINDINGS: Lower chest and abdominal wall: There is new septal thickening and pleural fluid at the bases. Hepatobiliary: No focal liver abnormality.Cholelithiasis without signs of cholecystitis. Calcification near the  pancreatic head is likely in the GDA or pancreas. No evidence of calcified choledocholithiasis. Pancreas: Atrophy with 15 mm pancreatic tail lesion. This appeared cystic on recent enhanced imaging. Spleen: Unremarkable. Adrenals/Urinary Tract:  Negative adrenals. Persistent asymmetric right perinephric edema without hydronephrosis. There are bilateral renal cysts and patchy right renal cortical scarring. No hydronephrosis or ureteral calculus. Persistent pericystic haziness and mural gas at the apex.  Reproductive:Negative Stomach/Bowel:  No obstruction. No inflammation. Vascular/Lymphatic: No acute vascular abnormality. No mass or adenopathy. Peritoneal: No ascites or pneumoperitoneum. Musculoskeletal: Degenerative changes and levoscoliosis. Remote intertrochanteric right femur fracture with repair and healing. IMPRESSION: 1. Emphysematous cystitis. Persistent asymmetric right perinephric edema which could reflect pyelonephritis. 2. CHF. 3. Cholelithiasis. 4. 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 Reference CT report 03/06/2015 for follow-up recommendations. Electronically Signed   By: Monte Fantasia M.D.   On: 05/16/2015 21:59   Dg Chest Port 1 View  05/16/2015  CLINICAL DATA:  Weakness and chest pain all day EXAM: PORTABLE CHEST 1 VIEW COMPARISON:  May 07, 2015 FINDINGS: The heart size and mediastinal contours are stable. The heart size is enlarged. There is pulmonary edema. There is no focal pneumonia or pleural effusion. There is mild atelectasis of both lung bases. The visualized skeletal structures are unremarkable. IMPRESSION: Congestive heart failure. Electronically Signed   By: Abelardo Diesel M.D.   On: 05/16/2015 21:02   I have personally reviewed and evaluated these images and lab results as part of my medical decision-making.   EKG Interpretation   Date/Time:  Thursday May 16 2015 20:17:05 EDT Ventricular Rate:  117 PR Interval:    QRS Duration: 99 QT Interval:  341 QTC Calculation: 476 R Axis:    7 Text Interpretation:  Atrial flutter with 2:1 AV block Low voltage,  extremity leads Nonspecific repol abnormality, diffuse leads ST elevation,  consider inferior injury Confirmed by Hazle Coca [phone]) on 05/16/2015  9:09:38 PM      MDM   Final diagnoses:  NSTEMI (non-ST elevated myocardial infarction) [facility])   Patient here with multiple complaints including weakness, chills, constipation, back pain, lower chest/upper abdominal pain. EKG with borderline ST elevation, patient with no active chest pain in the department. Troponin is elevated at 44. CT abdomen obtained to rule out bowel obstruction. Discussed the case with Dr. Philbert Riser with cardiology who recommends heparin and medicine admission. Discussed the case with the hospitalist service who agrees to admit the patient in transfer. Patient updated of findings of studies with elevated troponin and need for admission for further treatment.  UA is cw UTI - will treat with Rocephin.    I personally performed the services described in this documentation, which was scribed in my presence. The recorded information has been reviewed and is accurate.     Quintella Reichert, MD 05/17/15 [phone]

## 2015-05-16 NOTE — Patient Instructions (Addendum)
With your recent constipation for 8 days and no bm I do think you need stat abd xray and maybe ct of abdomen.  With you recent hypokalemia, I do think you this labs done stat.  In addition with fatigue, chills and onset of back pain rapid flu test needs to be done but we are out of test.  In addition with you recent pyelnoephritis and sepsis/hospitalization a cbc would be helpful as well.  I called the ED physician and notified him that you are on your way down for evaluation.

## 2015-05-16 NOTE — Progress Notes (Signed)
Pre visit review using our clinic review tool, if applicable. No additional management support is needed unless otherwise documented below in the visit note. 

## 2015-05-16 NOTE — ED Notes (Signed)
Patient transported to CT 

## 2015-05-16 NOTE — ED Notes (Signed)
Spouse [name] phone number [phone] cell [phone] home please notify spouse with room number

## 2015-05-16 NOTE — ED Notes (Addendum)
Husband states pt with recent admn for kidney infection-c/o no BM x 5 days-pt states "i can pee but i can't shit"-pt sent from Dr Larose Kells office-presents in w/c-NAD-paperwork from PCP reads pt recent hypokalemia, fatigue, chills, back pain-recent hosp for pyelonephritis and sepsis and that EDP was called-note per Ila Mcgill, PA

## 2015-05-16 NOTE — Telephone Encounter (Signed)
Caller name: Cecille Rubin Relationship to patient: [place] Can be reached: Pharmacy:  FYICecille Rubin from [facility] called to inform PCP that Patient had an elevated BP of 162/68. States she is not currently with the patient but patient was complaining of SOB with exertion, some chest tightness, 2 plus adema in both legs, and no BM since 3/11. States patient has appt on Monday and wanted to know if PCP would address these issues then

## 2015-05-16 NOTE — Progress Notes (Signed)
This is a no charge note  Transfer from [facility] per Dr. Ralene Bathe  75 year old lady with past medical history of hypertension, hyperlipidemia, diabetes mellitus, depression, left eye blindness, migraine headache, atrial fibrillation on Coumadin, lung nodule, seizure, herpes encephalopathy, systolic congestive heart failure with EF of 35-40 percent, recurrent UTI, who presents with intermittent chest pain, generalized weakness, constipation, chills and muscle pain. Pt was recently hospitalized from 3/6-3/11/17 and treated for pyelonephritis, discharged on Keflex.  In the emergency room, patient was found to have a elevated troponin 44.57, WBC 15.5, temperature normal, tachycardia, urinalysis with a small amount of leukocyte, and are 1.5 full, potassium 3.2, creatinine normal, chest x-ray consistent with CHF. Patient is accepted to SDU. IV heparin was started by EDP. Cardiology, Dr. Philbert Riser was consulted  By EDP.  Please call and let Dr. Philbert Riser know when pt arrives.   Ivor Costa, MD  [facility] Pager [phone]  If 7PM-7AM, please contact night-coverage [email or web address] Password [facility] 05/16/2015, 9:19 PM

## 2015-05-16 NOTE — Progress Notes (Signed)
ANTICOAGULATION CONSULT NOTE - Initial Consult  Pharmacy Consult for Heparin  Indication: chest pain/ACS  Allergies  Allergen Reactions  . Procaine Hcl Anaphylaxis    Patient Measurements: Height: 5\' 9"  (175.3 cm) Weight: 227 lb (102.967 kg) IBW/kg (Calculated) : 66.2   Vital Signs: Temp: 98.8 F (37.1 C) (03/16 2043) Temp Source: Rectal (03/16 2043) BP: 146/85 mmHg (03/16 2030) Pulse Rate: 101 (03/16 2030)  Labs:  Recent Labs  05/16/15 1925 05/16/15 1930  HGB 11.0*  --   HCT 34.5*  --   PLT 458*  --   LABPROT 18.5*  --   INR 1.54*  --   CREATININE 0.96  --   TROPONINI  --  44.57*    Estimated Creatinine Clearance: 66.7 mL/min (by C-G formula based on Cr of 0.96).   Medical History: Past Medical History  Diagnosis Date  . Blindness of left eye   . Eye muscle weakness     Right eye weakness after cataract surgery  . Common migraine     ?hx of  . DIABETES MELLITUS, TYPE II 03/27/2006    dr [name]  . HYPERLIPIDEMIA 03/27/2006  . 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 HYPERTENSION 03/27/2006  . Atrial fibrillation ([place])     SVT dx 2007, cath 2007 mild CAD, then had an cardioversion, ablation; still on coumadin , has occ palpitation, EKG 03-2010 NSR  . Headache(784.0) 05/07/2009  . LUNG NODULE 09/01/2006    Excision, Bx [facility]  . Osteopenia 2004    Dexa 2004 showed Osteopenia, DEXA 03/2007 normal  . Seizures ([place])   . Intertrochanteric fracture of right hip ([place]) 07/13/2012  . 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 GERD (gastroesophageal reflux disease) 10/05/2011  . Recurrent urinary tract infection     Seeing Urology  . 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[place]) [id number]     Assessment: 78 yof admitted 05/16/2015  With CP. Pharmacy consulted to dose IV heparin for ACS/cheat pain. Pt on coumadin PTA.  PTA coumadin dose = 2.5mg  daily (last dose unknown) INR on admit 1.54. Hgb 11, PLT 458   Goal of Therapy:  Heparin level 0.3-0.7 units/ml Monitor platelets by anticoagulation  protocol: Yes   Plan:  Heparin 4000 unit bolus  Heparin @ 1300 units/hr 8hr HL Daily HL/CBC/INR F/U restart warfarin   Drakkar Medeiros C. Lennox Grumbles, PharmD Pharmacy Resident  Pager: [phone] 05/16/2015 9:09 PM

## 2015-05-17 ENCOUNTER — Encounter (HOSPITAL_COMMUNITY): Payer: Self-pay | Admitting: Internal Medicine

## 2015-05-17 ENCOUNTER — Other Ambulatory Visit: Payer: Self-pay

## 2015-05-17 ENCOUNTER — Encounter (HOSPITAL_COMMUNITY)
Admission: EM | Disposition: A | Discharge status: Home / Self Care | Payer: Self-pay | Source: Home / Self Care | Attending: Internal Medicine

## 2015-05-17 DIAGNOSIS — E1159 Type 2 diabetes mellitus with other circulatory complications: Secondary | ICD-10-CM

## 2015-05-17 DIAGNOSIS — I5022 Chronic systolic (congestive) heart failure: Secondary | ICD-10-CM

## 2015-05-17 DIAGNOSIS — G40909 Epilepsy, unspecified, not intractable, without status epilepticus: Secondary | ICD-10-CM

## 2015-05-17 DIAGNOSIS — I2510 Atherosclerotic heart disease of native coronary artery without angina pectoris: Secondary | ICD-10-CM

## 2015-05-17 DIAGNOSIS — I481 Persistent atrial fibrillation: Secondary | ICD-10-CM

## 2015-05-17 DIAGNOSIS — E118 Type 2 diabetes mellitus with unspecified complications: Secondary | ICD-10-CM

## 2015-05-17 DIAGNOSIS — Z794 Long term (current) use of insulin: Secondary | ICD-10-CM

## 2015-05-17 DIAGNOSIS — I482 Chronic atrial fibrillation: Secondary | ICD-10-CM

## 2015-05-17 DIAGNOSIS — I214 Non-ST elevation (NSTEMI) myocardial infarction: Secondary | ICD-10-CM | POA: Insufficient documentation

## 2015-05-17 HISTORY — PX: CARDIAC CATHETERIZATION: SHX172

## 2015-05-17 LAB — COMPREHENSIVE METABOLIC PANEL
ALT: 21 U/L (ref 14–54)
ANION GAP: 11 (ref 5–15)
AST: 142 U/L — ABNORMAL HIGH (ref 15–41)
Albumin: 2.5 g/dL — ABNORMAL LOW (ref 3.5–5.0)
Alkaline Phosphatase: 103 U/L (ref 38–126)
BUN: 9 mg/dL (ref 6–20)
CALCIUM: 8.4 mg/dL — AB (ref 8.9–10.3)
CHLORIDE: 96 mmol/L — AB (ref 101–111)
CO2: 30 mmol/L (ref 22–32)
CREATININE: 1.13 mg/dL — AB (ref 0.44–1.00)
GFR, EST AFRICAN AMERICAN: 54 mL/min — AB (ref >60)
GFR, EST NON AFRICAN AMERICAN: 47 mL/min — AB (ref >60)
Glucose, Bld: 198 mg/dL — ABNORMAL HIGH (ref 65–99)
Potassium: 4.3 mmol/L (ref 3.5–5.1)
Sodium: 137 mmol/L (ref 135–145)
Total Bilirubin: 0.8 mg/dL (ref 0.3–1.2)
Total Protein: 6.4 g/dL — ABNORMAL LOW (ref 6.5–8.1)

## 2015-05-17 LAB — CBC WITH DIFFERENTIAL/PLATELET
BASOS PCT: 0 %
Basophils Absolute: 0 10*3/uL (ref 0.0–0.1)
EOS ABS: 0 10*3/uL (ref 0.0–0.7)
Eosinophils Relative: 0 %
HCT: 30.6 % — ABNORMAL LOW (ref 36.0–46.0)
HEMOGLOBIN: 9.8 g/dL — AB (ref 12.0–15.0)
LYMPHS ABS: 1.6 10*3/uL (ref 0.7–4.0)
Lymphocytes Relative: 9 %
MCH: 29.3 pg (ref 26.0–34.0)
MCHC: 32 g/dL (ref 30.0–36.0)
MCV: 91.3 fL (ref 78.0–100.0)
Monocytes Absolute: 1.5 10*3/uL — ABNORMAL HIGH (ref 0.1–1.0)
Monocytes Relative: 8 %
NEUTROS PCT: 83 %
Neutro Abs: 15.1 10*3/uL — ABNORMAL HIGH (ref 1.7–7.7)
Platelets: 466 10*3/uL — ABNORMAL HIGH (ref 150–400)
RBC: 3.35 MIL/uL — AB (ref 3.87–5.11)
RDW: 14.1 % (ref 11.5–15.5)
WBC: 18.2 10*3/uL — AB (ref 4.0–10.5)

## 2015-05-17 LAB — CREATININE, SERUM
Creatinine, Ser: 1.21 mg/dL — ABNORMAL HIGH (ref 0.44–1.00)
GFR, EST AFRICAN AMERICAN: 50 mL/min — AB (ref >60)
GFR, EST NON AFRICAN AMERICAN: 43 mL/min — AB (ref >60)

## 2015-05-17 LAB — CBC
HEMATOCRIT: 29.7 % — AB (ref 36.0–46.0)
HEMOGLOBIN: 9.5 g/dL — AB (ref 12.0–15.0)
MCH: 29.3 pg (ref 26.0–34.0)
MCHC: 32 g/dL (ref 30.0–36.0)
MCV: 91.7 fL (ref 78.0–100.0)
Platelets: 426 10*3/uL — ABNORMAL HIGH (ref 150–400)
RBC: 3.24 MIL/uL — AB (ref 3.87–5.11)
RDW: 14.2 % (ref 11.5–15.5)
WBC: 17.7 10*3/uL — AB (ref 4.0–10.5)

## 2015-05-17 LAB — GLUCOSE, CAPILLARY
GLUCOSE-CAPILLARY: 213 mg/dL — AB (ref 65–99)
Glucose-Capillary: 171 mg/dL — ABNORMAL HIGH (ref 65–99)
Glucose-Capillary: 175 mg/dL — ABNORMAL HIGH (ref 65–99)
Glucose-Capillary: 254 mg/dL — ABNORMAL HIGH (ref 65–99)
Glucose-Capillary: 288 mg/dL — ABNORMAL HIGH (ref 65–99)

## 2015-05-17 LAB — HEPARIN LEVEL (UNFRACTIONATED): HEPARIN UNFRACTIONATED: 0.6 [IU]/mL (ref 0.30–0.70)

## 2015-05-17 LAB — POCT ACTIVATED CLOTTING TIME: Activated Clotting Time: 472 s

## 2015-05-17 LAB — TROPONIN I
TROPONIN I: 42.66 ng/mL — AB (ref <0.031)
TROPONIN I: 47.2 ng/mL — AB (ref <0.031)
TROPONIN I: 48.33 ng/mL — AB (ref <0.031)

## 2015-05-17 LAB — PLATELET COUNT: Platelets: 440 K/uL — ABNORMAL HIGH (ref 150–400)

## 2015-05-17 SURGERY — LEFT HEART CATH AND CORONARY ANGIOGRAPHY
Anesthesia: LOCAL

## 2015-05-17 MED ORDER — METOPROLOL TARTRATE 25 MG PO TABS: 25.0000 mg | ORAL_TABLET | Freq: Two times a day (BID) | ORAL | Status: DC

## 2015-05-17 MED ORDER — TICAGRELOR 90 MG PO TABS
ORAL_TABLET | ORAL | Status: AC
  Filled 2015-05-17: qty 2

## 2015-05-17 MED ORDER — NITROGLYCERIN 1 MG/10 ML FOR IR/CATH LAB
INTRA_ARTERIAL | Status: AC
  Filled 2015-05-17: qty 10

## 2015-05-17 MED ORDER — SODIUM CHLORIDE 0.9% FLUSH: 3.0000 mL | INTRAVENOUS | Status: DC | PRN

## 2015-05-17 MED ORDER — PHENYTOIN SODIUM EXTENDED 100 MG PO CAPS
300.0000 mg | ORAL_CAPSULE | Freq: Every day | ORAL | Status: DC
  Administered 2015-05-17 – 2015-05-21 (×5): 300 mg via ORAL
  Filled 2015-05-17 (×5): qty 3

## 2015-05-17 MED ORDER — NITROGLYCERIN 1 MG/10 ML FOR IR/CATH LAB
INTRA_ARTERIAL | Status: DC | PRN
  Administered 2015-05-17: 200 ug via INTRACORONARY

## 2015-05-17 MED ORDER — ACETAMINOPHEN 325 MG PO TABS
650.0000 mg | ORAL_TABLET | ORAL | Status: DC | PRN
  Administered 2015-05-17: 15:00:00 650 mg via ORAL
  Filled 2015-05-17: qty 2

## 2015-05-17 MED ORDER — ONDANSETRON HCL 4 MG/2ML IJ SOLN: 4.0000 mg | Freq: Four times a day (QID) | INTRAMUSCULAR | Status: DC | PRN

## 2015-05-17 MED ORDER — MIDAZOLAM HCL 2 MG/2ML IJ SOLN
INTRAMUSCULAR | Status: AC
  Filled 2015-05-17: qty 2

## 2015-05-17 MED ORDER — ACETAMINOPHEN 650 MG RE SUPP: 650.0000 mg | Freq: Four times a day (QID) | RECTAL | Status: DC | PRN

## 2015-05-17 MED ORDER — VERAPAMIL HCL 2.5 MG/ML IV SOLN
INTRAVENOUS | Status: DC | PRN
  Administered 2015-05-17: 10 mL via INTRA_ARTERIAL

## 2015-05-17 MED ORDER — HEPARIN SODIUM (PORCINE) 5000 UNIT/ML IJ SOLN
5000.0000 [IU] | Freq: Three times a day (TID) | INTRAMUSCULAR | Status: DC
  Filled 2015-05-17: qty 1

## 2015-05-17 MED ORDER — LIDOCAINE HCL (PF) 1 % IJ SOLN
INTRAMUSCULAR | Status: DC | PRN
  Administered 2015-05-17: 1 mL

## 2015-05-17 MED ORDER — HEART ATTACK BOUNCING BOOK
Freq: Once | Status: AC
  Administered 2015-05-18
  Filled 2015-05-17: qty 1

## 2015-05-17 MED ORDER — NITROGLYCERIN IN D5W 200-5 MCG/ML-% IV SOLN
2.0000 ug/min | INTRAVENOUS | Status: DC
  Administered 2015-05-17: 5 ug/min via INTRAVENOUS
  Filled 2015-05-17: qty 250

## 2015-05-17 MED ORDER — WARFARIN SODIUM 5 MG PO TABS
2.5000 mg | ORAL_TABLET | Freq: Once | ORAL | Status: AC
  Administered 2015-05-17: 2.5 mg via ORAL
  Filled 2015-05-17: qty 0.5

## 2015-05-17 MED ORDER — LIVING BETTER WITH HEART FAILURE BOOK
Freq: Once | Status: AC
  Administered 2015-05-18

## 2015-05-17 MED ORDER — MIDAZOLAM HCL 2 MG/2ML IJ SOLN
INTRAMUSCULAR | Status: DC | PRN
  Administered 2015-05-17: 1 mg via INTRAVENOUS
  Administered 2015-05-17: 2 mg via INTRAVENOUS

## 2015-05-17 MED ORDER — LORAZEPAM 0.5 MG PO TABS
0.2500 mg | ORAL_TABLET | Freq: Three times a day (TID) | ORAL | Status: DC | PRN
  Administered 2015-05-17 – 2015-05-19 (×3): 0.25 mg via ORAL
  Filled 2015-05-17 (×2): qty 1

## 2015-05-17 MED ORDER — SODIUM CHLORIDE 0.9 % WEIGHT BASED INFUSION: 1.0000 mL/kg/h | INTRAVENOUS | Status: DC

## 2015-05-17 MED ORDER — WARFARIN - PHARMACIST DOSING INPATIENT
Freq: Every day | Status: DC
  Administered 2015-05-18 – 2015-05-20 (×2)

## 2015-05-17 MED ORDER — ACETAMINOPHEN 325 MG PO TABS: 650.0000 mg | ORAL_TABLET | Freq: Four times a day (QID) | ORAL | Status: DC | PRN

## 2015-05-17 MED ORDER — ANGIOPLASTY BOOK
Freq: Once | Status: AC
Start: 2015-05-18 — End: 2015-05-18
  Administered 2015-05-18
  Filled 2015-05-17: qty 1

## 2015-05-17 MED ORDER — ESCITALOPRAM OXALATE 10 MG PO TABS
20.0000 mg | ORAL_TABLET | Freq: Every day | ORAL | Status: DC
  Administered 2015-05-17 – 2015-05-21 (×5): 20 mg via ORAL
  Filled 2015-05-17: qty 1
  Filled 2015-05-17 (×4): qty 2
  Filled 2015-05-17: qty 1

## 2015-05-17 MED ORDER — DILTIAZEM HCL ER COATED BEADS 240 MG PO CP24
240.0000 mg | ORAL_CAPSULE | Freq: Every day | ORAL | Status: DC
  Administered 2015-05-17 – 2015-05-18 (×2): 240 mg via ORAL
  Filled 2015-05-17 (×2): qty 1

## 2015-05-17 MED ORDER — POLYETHYLENE GLYCOL 3350 17 G PO PACK
17.0000 g | PACK | Freq: Every day | ORAL | Status: DC
  Administered 2015-05-17 – 2015-05-21 (×4): 17 g via ORAL
  Filled 2015-05-17 (×4): qty 1

## 2015-05-17 MED ORDER — LIDOCAINE HCL (PF) 1 % IJ SOLN
INTRAMUSCULAR | Status: AC
  Filled 2015-05-17: qty 30

## 2015-05-17 MED ORDER — TIROFIBAN HCL IN NACL 5-0.9 MG/100ML-% IV SOLN
INTRAVENOUS | Status: DC | PRN
  Administered 2015-05-17: 0.15 ug/kg/min via INTRAVENOUS

## 2015-05-17 MED ORDER — SODIUM CHLORIDE 0.9 % IV SOLN: INTRAVENOUS | Status: DC

## 2015-05-17 MED ORDER — BISACODYL 10 MG RE SUPP: 10.0000 mg | Freq: Once | RECTAL | Status: DC

## 2015-05-17 MED ORDER — BIVALIRUDIN 250 MG IV SOLR
250.0000 mg | INTRAVENOUS | Status: DC | PRN
  Administered 2015-05-17: 1.75 mg/kg/h via INTRAVENOUS

## 2015-05-17 MED ORDER — SODIUM CHLORIDE 0.9 % IV SOLN: 250.0000 mL | INTRAVENOUS | Status: DC | PRN

## 2015-05-17 MED ORDER — TIROFIBAN HCL IN NACL 5-0.9 MG/100ML-% IV SOLN
INTRAVENOUS | Status: AC
  Filled 2015-05-17: qty 100

## 2015-05-17 MED ORDER — BIVALIRUDIN BOLUS VIA INFUSION - CUPID
INTRAVENOUS | Status: DC | PRN
  Administered 2015-05-17: 76.8 mg via INTRAVENOUS

## 2015-05-17 MED ORDER — INSULIN ASPART 100 UNIT/ML ~~LOC~~ SOLN
0.0000 [IU] | Freq: Three times a day (TID) | SUBCUTANEOUS | Status: DC
  Administered 2015-05-18: 3 [IU] via SUBCUTANEOUS
  Administered 2015-05-18: 7 [IU] via SUBCUTANEOUS
  Administered 2015-05-18: 5 [IU] via SUBCUTANEOUS
  Administered 2015-05-19: 3 [IU] via SUBCUTANEOUS
  Administered 2015-05-19: 8 [IU] via SUBCUTANEOUS
  Administered 2015-05-19: 5 [IU] via SUBCUTANEOUS
  Administered 2015-05-20 (×2): 3 [IU] via SUBCUTANEOUS
  Administered 2015-05-20 – 2015-05-21 (×2): 2 [IU] via SUBCUTANEOUS
  Administered 2015-05-21: 3 [IU] via SUBCUTANEOUS

## 2015-05-17 MED ORDER — ASPIRIN 81 MG PO CHEW: 81.0000 mg | CHEWABLE_TABLET | ORAL | Status: DC

## 2015-05-17 MED ORDER — ONDANSETRON HCL 4 MG PO TABS: 4.0000 mg | ORAL_TABLET | Freq: Four times a day (QID) | ORAL | Status: DC | PRN

## 2015-05-17 MED ORDER — LORAZEPAM 0.5 MG PO TABS
0.5000 mg | ORAL_TABLET | Freq: Three times a day (TID) | ORAL | Status: DC | PRN
  Filled 2015-05-17: qty 1

## 2015-05-17 MED ORDER — ATORVASTATIN CALCIUM 40 MG PO TABS
40.0000 mg | ORAL_TABLET | Freq: Every day | ORAL | Status: DC
  Administered 2015-05-17 – 2015-05-19 (×4): 40 mg via ORAL
  Filled 2015-05-17 (×4): qty 1

## 2015-05-17 MED ORDER — CLOPIDOGREL BISULFATE 75 MG PO TABS
75.0000 mg | ORAL_TABLET | Freq: Every day | ORAL | Status: DC
  Administered 2015-05-18 – 2015-05-21 (×4): 75 mg via ORAL
  Filled 2015-05-17 (×4): qty 1

## 2015-05-17 MED ORDER — ASPIRIN 81 MG PO CHEW
81.0000 mg | CHEWABLE_TABLET | Freq: Every day | ORAL | Status: DC
  Administered 2015-05-18 – 2015-05-21 (×4): 81 mg via ORAL
  Filled 2015-05-17 (×4): qty 1

## 2015-05-17 MED ORDER — FUROSEMIDE 10 MG/ML IJ SOLN
40.0000 mg | Freq: Once | INTRAMUSCULAR | Status: AC
  Administered 2015-05-17: 40 mg via INTRAVENOUS
  Filled 2015-05-17: qty 4

## 2015-05-17 MED ORDER — HEPARIN SODIUM (PORCINE) 1000 UNIT/ML IJ SOLN
INTRAMUSCULAR | Status: AC
  Filled 2015-05-17: qty 1

## 2015-05-17 MED ORDER — INSULIN ASPART 100 UNIT/ML ~~LOC~~ SOLN
0.0000 [IU] | SUBCUTANEOUS | Status: DC
  Administered 2015-05-17: 2 [IU] via SUBCUTANEOUS
  Administered 2015-05-17: 14:00:00 3 [IU] via SUBCUTANEOUS

## 2015-05-17 MED ORDER — ASPIRIN EC 325 MG PO TBEC
325.0000 mg | DELAYED_RELEASE_TABLET | Freq: Every day | ORAL | Status: DC
  Administered 2015-05-17: 325 mg via ORAL
  Filled 2015-05-17: qty 1

## 2015-05-17 MED ORDER — FENTANYL CITRATE (PF) 100 MCG/2ML IJ SOLN
INTRAMUSCULAR | Status: AC
  Filled 2015-05-17: qty 2

## 2015-05-17 MED ORDER — VERAPAMIL HCL 2.5 MG/ML IV SOLN
INTRAVENOUS | Status: AC
  Filled 2015-05-17: qty 2

## 2015-05-17 MED ORDER — CEPHALEXIN 500 MG PO CAPS
500.0000 mg | ORAL_CAPSULE | Freq: Three times a day (TID) | ORAL | Status: DC
  Administered 2015-05-17 – 2015-05-19 (×11): 500 mg via ORAL
  Filled 2015-05-17 (×12): qty 1

## 2015-05-17 MED ORDER — HEPARIN (PORCINE) IN NACL 2-0.9 UNIT/ML-% IJ SOLN
INTRAMUSCULAR | Status: AC
  Filled 2015-05-17: qty 1500

## 2015-05-17 MED ORDER — IOHEXOL 350 MG/ML SOLN
INTRAVENOUS | Status: DC | PRN
  Administered 2015-05-17: 160 mL via INTRA_ARTERIAL

## 2015-05-17 MED ORDER — METOPROLOL TARTRATE 1 MG/ML IV SOLN
5.0000 mg | Freq: Once | INTRAVENOUS | Status: AC
  Administered 2015-05-17: 5 mg via INTRAVENOUS
  Filled 2015-05-17: qty 5

## 2015-05-17 MED ORDER — MORPHINE SULFATE (PF) 2 MG/ML IV SOLN
1.0000 mg | INTRAVENOUS | Status: DC | PRN
  Administered 2015-05-20: 1 mg via INTRAVENOUS
  Filled 2015-05-17: qty 1

## 2015-05-17 MED ORDER — HEPARIN (PORCINE) IN NACL 2-0.9 UNIT/ML-% IJ SOLN
INTRAMUSCULAR | Status: DC | PRN
  Administered 2015-05-17: 1000 mL

## 2015-05-17 MED ORDER — ONDANSETRON HCL 4 MG/2ML IJ SOLN
4.0000 mg | Freq: Four times a day (QID) | INTRAMUSCULAR | Status: DC | PRN
  Administered 2015-05-17 – 2015-05-20 (×2): 4 mg via INTRAVENOUS
  Filled 2015-05-17 (×3): qty 2

## 2015-05-17 MED ORDER — CLOPIDOGREL BISULFATE 75 MG PO TABS
300.0000 mg | ORAL_TABLET | Freq: Once | ORAL | Status: AC
  Administered 2015-05-17: 18:00:00 300 mg via ORAL
  Filled 2015-05-17: qty 4

## 2015-05-17 MED ORDER — FENTANYL CITRATE (PF) 100 MCG/2ML IJ SOLN
INTRAMUSCULAR | Status: DC | PRN
  Administered 2015-05-17 (×2): 25 ug via INTRAVENOUS

## 2015-05-17 MED ORDER — TIROFIBAN (AGGRASTAT) BOLUS VIA INFUSION
INTRAVENOUS | Status: DC | PRN
  Administered 2015-05-17: 2560 ug via INTRAVENOUS

## 2015-05-17 MED ORDER — NITROGLYCERIN IN D5W 200-5 MCG/ML-% IV SOLN: 0.0000 ug/min | INTRAVENOUS | Status: DC

## 2015-05-17 MED ORDER — INSULIN ASPART 100 UNIT/ML ~~LOC~~ SOLN
0.0000 [IU] | Freq: Every day | SUBCUTANEOUS | Status: DC
  Administered 2015-05-17 – 2015-05-19 (×3): 3 [IU] via SUBCUTANEOUS

## 2015-05-17 MED ORDER — VERAPAMIL HCL 2.5 MG/ML IV SOLN
INTRAVENOUS | Status: DC | PRN
  Administered 2015-05-17 (×2): 200 ug via INTRACORONARY

## 2015-05-17 MED ORDER — POTASSIUM CHLORIDE CRYS ER 20 MEQ PO TBCR
40.0000 meq | EXTENDED_RELEASE_TABLET | Freq: Once | ORAL | Status: AC
  Administered 2015-05-17: 40 meq via ORAL
  Filled 2015-05-17: qty 2

## 2015-05-17 MED ORDER — HEPARIN SODIUM (PORCINE) 1000 UNIT/ML IJ SOLN
INTRAMUSCULAR | Status: DC | PRN
  Administered 2015-05-17: 5000 [IU] via INTRAVENOUS

## 2015-05-17 MED ORDER — INSULIN GLARGINE 100 UNIT/ML ~~LOC~~ SOLN
15.0000 [IU] | Freq: Every day | SUBCUTANEOUS | Status: DC
  Administered 2015-05-18 – 2015-05-19 (×2): 15 [IU] via SUBCUTANEOUS
  Filled 2015-05-17 (×3): qty 0.15

## 2015-05-17 MED ORDER — METOPROLOL TARTRATE 50 MG PO TABS: 75.0000 mg | ORAL_TABLET | Freq: Two times a day (BID) | ORAL | Status: DC

## 2015-05-17 MED ORDER — DOCUSATE SODIUM 100 MG PO CAPS
100.0000 mg | ORAL_CAPSULE | Freq: Two times a day (BID) | ORAL | Status: DC
  Administered 2015-05-17: 100 mg via ORAL
  Filled 2015-05-17: qty 1

## 2015-05-17 MED ORDER — SODIUM CHLORIDE 0.9% FLUSH: 3.0000 mL | Freq: Two times a day (BID) | INTRAVENOUS | Status: DC

## 2015-05-17 MED ORDER — SODIUM CHLORIDE 0.9 % IV SOLN
INTRAVENOUS | Status: DC | PRN
  Administered 2015-05-17: 10 mL/h via INTRAVENOUS

## 2015-05-17 MED ORDER — SODIUM CHLORIDE 0.9% FLUSH
3.0000 mL | Freq: Two times a day (BID) | INTRAVENOUS | Status: DC
  Administered 2015-05-17: 10 mL via INTRAVENOUS
  Administered 2015-05-18 – 2015-05-20 (×5): 3 mL via INTRAVENOUS

## 2015-05-17 MED ORDER — CALCIUM CARBONATE-VITAMIN D 500-200 MG-UNIT PO TABS
1.0000 | ORAL_TABLET | Freq: Every day | ORAL | Status: DC
  Administered 2015-05-18 – 2015-05-21 (×4): 1 via ORAL
  Filled 2015-05-17 (×4): qty 1

## 2015-05-17 MED ORDER — METOPROLOL TARTRATE 25 MG PO TABS
25.0000 mg | ORAL_TABLET | Freq: Two times a day (BID) | ORAL | Status: DC
  Administered 2015-05-18: 09:00:00 25 mg via ORAL
  Filled 2015-05-17: qty 1

## 2015-05-17 MED ORDER — BIVALIRUDIN 250 MG IV SOLR
INTRAVENOUS | Status: AC
  Filled 2015-05-17: qty 250

## 2015-05-17 SURGICAL SUPPLY — 19 items
BALLN EUPHORA RX 2.0X15 (BALLOONS) ×2
BALLN EUPHORA RX 2.5X15 (BALLOONS) ×2
BALLOON EUPHORA RX 2.0X15 (BALLOONS) IMPLANT
BALLOON EUPHORA RX 2.5X15 (BALLOONS) IMPLANT
CATH INFINITI 5 FR JL3.5 (CATHETERS) ×1 IMPLANT
CATH INFINITI 5FR ANG PIGTAIL (CATHETERS) ×1 IMPLANT
CATH INFINITI JR4 5F (CATHETERS) ×1 IMPLANT
DEVICE RAD COMP TR BAND LRG (VASCULAR PRODUCTS) ×2 IMPLANT
GLIDESHEATH SLEND SS 6F .021 (SHEATH) ×1 IMPLANT
GUIDE CATH RUNWAY 6FR CLS3 (CATHETERS) ×1 IMPLANT
KIT ENCORE 26 ADVANTAGE (KITS) ×1 IMPLANT
KIT HEART LEFT (KITS) ×2 IMPLANT
PACK CARDIAC CATHETERIZATION (CUSTOM PROCEDURE TRAY) ×2 IMPLANT
TRANSDUCER W/STOPCOCK (MISCELLANEOUS) ×2 IMPLANT
TUBING CIL FLEX 10 FLL-RA (TUBING) ×2 IMPLANT
VALVE GUARDIAN II ~~LOC~~ HEMO (MISCELLANEOUS) ×1 IMPLANT
WIRE ASAHI FIELDER XT 190CM (WIRE) ×1 IMPLANT
WIRE ASAHI PROWATER 180CM (WIRE) ×1 IMPLANT
WIRE SAFE-T 1.5MM-J .035X260CM (WIRE) ×1 IMPLANT

## 2015-05-17 NOTE — H&P (View-Only) (Signed)
Patient Name: [name] [name] Date of Encounter: 05/17/2015  Primary Cardiologist: Dr.    Tomasa Hose Problem:   Non-ST elevation MI (NSTEMI) [facility]) Active Problems:   Seizure disorder ([place])   A-fib ([place])   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[place])   Type 2 diabetes mellitus with vascular disease ([place])   Non-ST elevated myocardial infarction ([place])    SUBJECTIVE  Started having chest pain yesterday along with SOB. Denies any blood in the stool or urine.   CURRENT MEDS . aspirin EC  325 mg Oral Daily  . atorvastatin  40 mg Oral QHS  . bisacodyl  10 mg Rectal Once  . calcium-vitamin D  1 tablet Oral Q breakfast  . cephALEXin  500 mg Oral TID AC & HS  . diltiazem  240 mg Oral Daily  . docusate sodium  100 mg Oral BID  . escitalopram  20 mg Oral Daily  . insulin aspart  0-9 Units Subcutaneous Q4H  . insulin glargine  15 Units Subcutaneous Daily  . [START ON 05/18/2015] metoprolol tartrate  25 mg Oral BID  . phenytoin  300 mg Oral Daily  . polyethylene glycol  17 g Oral Daily    OBJECTIVE  Filed Vitals:   05/16/15 2333 05/17/15 0003 05/17/15 0402 05/17/15 0832  BP: 126/81  145/64 135/60  Pulse: 51  70 79  Temp: 97.9 F (36.6 C)  99 F (37.2 C) 98.5 F (36.9 C)  TempSrc: Oral  Oral Oral  Resp: 22  19   Height:  5\' 9"  (1.753 m)    Weight:  225 lb 12.8 oz (102.422 kg)    SpO2: 96%  98% 97%    Intake/Output Summary (Last 24 hours) at 05/17/15 1016 Last data filed at 05/17/15 0838  Gross per 24 hour  Intake      0 ml  Output    700 ml  Net   -700 ml   Filed Weights   05/16/15 1847 05/17/15 0003  Weight: 227 lb (102.967 kg) 225 lb 12.8 oz (102.422 kg)    PHYSICAL EXAM  General: Pleasant, NAD. Neuro: Alert and oriented X 3. Moves all extremities spontaneously. Psych: Normal affect. HEENT:  Normal  Neck: Supple without bruits or JVD. Lungs:  Resp regular and unlabored, CTA. Heart: RRR no s3, s4, or murmurs. Abdomen: Soft, non-tender, non-distended, BS + x 4.  Extremities: No  clubbing, cyanosis. DP/PT/Radials 2+ and equal bilaterally. 1-2+ pitting edema  Accessory Clinical Findings  CBC  Recent Labs  05/16/15 1925 05/17/15 0708  WBC 15.5* 18.2*  NEUTROABS 12.1* 15.1*  HGB 11.0* 9.8*  HCT 34.5* 30.6*  MCV 93.2 91.3  PLT 458* 123XX123*   Basic Metabolic Panel  Recent Labs  05/16/15 1925 05/17/15 0708  NA 136 137  K 3.2* 4.3  CL 96* 96*  CO2 27 30  GLUCOSE 166* 198*  BUN 11 9  CREATININE 0.96 1.13*  CALCIUM 8.2* 8.4*   Liver Function Tests  Recent Labs  05/16/15 1925 05/17/15 0708  AST 163* 142*  ALT 22 21  ALKPHOS 101 103  BILITOT 0.5 0.8  PROT 7.1 6.4*  ALBUMIN 2.9* 2.5*   Cardiac Enzymes  Recent Labs  05/16/15 1930 05/17/15 0146 05/17/15 0708  TROPONINI 44.57* 42.66* 47.20*    TELE afib    ECG  afib with minimal ST elevation in lateral leads  Echocardiogram 05/10/2015  LV EF: 35% - 40%  ------------------------------------------------------------------- Indications: Atrial fibrillation - 427.31.  ------------------------------------------------------------------- History: PMH: Elevated Troponin. EKG changes. Risk factors:  Hypertension. Diabetes mellitus. Dyslipidemia.  ------------------------------------------------------------------- Study Conclusions  - Left ventricle: The cavity size was normal. There was moderate  concentric hypertrophy. Systolic function was moderately reduced.  The estimated ejection fraction was in the range of 35% to 40%.  Diffuse hypokinesis. The study was not technically sufficient to  allow evaluation of LV diastolic dysfunction due to atrial  fibrillation. - Aortic valve: Trileaflet; normal thickness leaflets. There was no  regurgitation. - Aortic root: The aortic root was normal in size. - Mitral valve: Mildly thickened leaflets . There was mild  regurgitation. - Left atrium: The atrium was moderately dilated. - Right ventricle: The cavity size was  normal. Wall thickness was  normal. Systolic function was normal. - Right atrium: The atrium was normal in size. - Tricuspid valve: There was moderate regurgitation. - Pulmonic valve: There was no regurgitation. - Pulmonary arteries: Systolic pressure was mildly to moderately  increased. PA peak pressure: 45 mm Hg (S). - Inferior vena cava: The vessel was normal in size. - Pericardium, extracardiac: There was no pericardial effusion.     Radiology/Studies  Ct Abdomen Pelvis Wo Contrast  05/16/2015  CLINICAL DATA:  Belching and constipation. EXAM: CT ABDOMEN AND PELVIS WITHOUT CONTRAST TECHNIQUE: Multidetector CT imaging of the abdomen and pelvis was performed following the standard protocol without IV contrast. COMPARISON:  05/06/2015 FINDINGS: Lower chest and abdominal wall: There is new septal thickening and pleural fluid at the bases. Hepatobiliary: No focal liver abnormality.Cholelithiasis without signs of cholecystitis. Calcification near the pancreatic head is likely in the GDA or pancreas. No evidence of calcified choledocholithiasis. Pancreas: Atrophy with 15 mm pancreatic tail lesion. This appeared cystic on recent enhanced imaging. Spleen: Unremarkable. Adrenals/Urinary Tract:  Negative adrenals. Persistent asymmetric right perinephric edema without hydronephrosis. There are bilateral renal cysts and patchy right renal cortical scarring. No hydronephrosis or ureteral calculus. Persistent pericystic haziness and mural gas at the apex. Reproductive:Negative Stomach/Bowel:  No obstruction. No inflammation. Vascular/Lymphatic: No acute vascular abnormality. No mass or adenopathy. Peritoneal: No ascites or pneumoperitoneum. Musculoskeletal: Degenerative changes and levoscoliosis. Remote intertrochanteric right femur fracture with repair and healing. IMPRESSION: 1. Emphysematous cystitis. Persistent asymmetric right perinephric edema which could reflect pyelonephritis. 2. CHF. 3.  Cholelithiasis. 4. 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 Reference CT report 03/06/2015 for follow-up recommendations. Electronically Signed   By: Monte Fantasia M.D.   On: 05/16/2015 21:59   Ct Abdomen Pelvis W Contrast  05/06/2015  CLINICAL DATA:  Nausea, vomiting and diarrhea. Patient fell this morning with decreased blood pressure. History of diabetes and hypertension. EXAM: CT ABDOMEN AND PELVIS WITH CONTRAST TECHNIQUE: Multidetector CT imaging of the abdomen and pelvis was performed using the standard protocol following bolus administration of intravenous contrast. CONTRAST:  71mL OMNIPAQUE IOHEXOL 300 MG/ML  SOLN COMPARISON:  CT 03/06/2015. FINDINGS: Lower chest: Stable atelectasis or scarring in both lung bases. Cardiomegaly and coronary artery calcifications are noted. There is no pleural or pericardial effusion. Hepatobiliary: The liver has a stable appearance without focal abnormality. Multiple small calcified gallstones are again noted. There is no gallbladder wall thickening or significant biliary dilatation. Pancreas: The pancreas appears unchanged with diffuse atrophy and multiple small cystic lesions, measuring up to 1.7 cm in the pancreatic tail on image number 27. No surrounding inflammatory change or pancreatic ductal dilatation. Spleen: Normal in size without focal abnormality. Adrenals/Urinary Tract: The adrenal glands appear stable without suspicious findings. No renal cortical scarring and atrophy are again noted, right-greater-than-left. There are several low-density renal lesions which are grossly stable.  There is new mild dilatation of the right renal pelvis with associated wall thickening and mildly delayed contrast excretion. There is also new asymmetric perinephric soft tissue stranding on the right. No ureteral dilatation or urinary tract calculus is seen. There is new air within the urinary bladder lumen. Some of this air may be contained within the bladder wall. No air is seen within the kidneys  or ureters. There are no inflammatory changes surrounding the bladder. Stomach/Bowel: No evidence of bowel wall thickening, distention or surrounding inflammatory change. The appendix appears normal. There is mild distal colonic diverticulosis. Vascular/Lymphatic: There are no enlarged abdominal or pelvic lymph nodes. There is diffuse atherosclerosis of the aorta, its branches and the iliac arteries. Reproductive: Unremarkable.  No evidence of adnexal mass. Other: No generalized ascites or free air. The anterior abdominal wall appears unremarkable. Musculoskeletal: No acute or significant osseous findings. There are stable degenerative changes throughout the spine associated with a mild convex left scoliosis. Previous right proximal femoral ORIF. Chronic T11 compression deformity appears unchanged. IMPRESSION: 1. Interval change in the appearance of the right kidney with moderate perinephric soft tissue stranding, pelvic wall thickening and delayed contrast excretion. No evidence of obstructing calculus. These findings could be secondary to a recently passed calculus, obstruction from previously suspected papillary necrosis or pyelonephritis. 2. Air within the urinary bladder lumen and possibly the right bladder wall. Emphysematous cystitis should be considered in this diabetic if bladder instrumentation has not been recently performed. Correlation with urine analysis recommended. 3. Cholelithiasis without evidence of cholecystitis or biliary dilatation. 4. 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 Please refer to follow up recommendations issued 2 months ago. Electronically Signed   By: Richardean Sale M.D.   On: 05/06/2015 16:57   Dg Chest Port 1 View  05/16/2015  CLINICAL DATA:  Weakness and chest pain all day EXAM: PORTABLE CHEST 1 VIEW COMPARISON:  May 07, 2015 FINDINGS: The heart size and mediastinal contours are stable. The heart size is enlarged. There is pulmonary edema. There  is no focal pneumonia or pleural effusion. There is mild atelectasis of both lung bases. The visualized skeletal structures are unremarkable. IMPRESSION: Congestive heart failure. Electronically Signed   By: Abelardo Diesel M.D.   On: 05/16/2015 21:02   Dg Chest Port 1 View  05/07/2015  CLINICAL DATA:  Sepsis, UTI, abdominal pain EXAM: PORTABLE CHEST 1 VIEW COMPARISON:  08/23/2014 and 11/21/2012 FINDINGS: Cardiomegaly again noted. Chronic elevation of the right hemidiaphragm. Central mild vascular congestion and mild interstitial prominence bilateral without convincing pulmonary edema. Stable old fracture of the right sixth rib. No segmental infiltrate. Mild basilar atelectasis. Suboptimal study due to patient's large body habitus IMPRESSION: Central mild vascular congestion and mild interstitial prominence bilaterally without convincing pulmonary edema. Suboptimal study due to patient's large body habitus. Chronic elevation of the right hemidiaphragm. No segmental infiltrate. Mild basilar atelectasis. Electronically Signed   By: Lahoma Crocker M.D.   On: 05/07/2015 07:55    ASSESSMENT AND PLAN  1. NSTEMI  - mild ST elevation in lateral leads last night, will repeat an EKG now, place on cath board for next case  - given elevated trop, plan for urgent cath today. She denies any bleeding issue  - Risk and benefit of procedure explained to the patient who display clear understanding and agree to proceed. Discussed with patient possible procedural risk include bleeding, vascular injury, renal injury, arrythmia, MI, stroke and loss of limb or life.  - INR 1.5 yesterday.  2. Chronic atrial fibrillation  - CHA2DS2-Vasc score 5 (HTN, DM, age, female, likely HF)  - rate controlled  3. Acute systolic HF  - 2+ pitting edema on exam, will need diureses after cath  - Note recent admission for sepsis and pyelo, echo obtained during that admission shows EF went down to 35-40%. Cardiology was not consulted during  that admission  4. HTN  5. DM    Signed, Almyra Deforest PA-C Pager: [phone]  Agree with note by Almyra Deforest PA-C  Pt admitted last PM with CP/SOB. No prior cardiac Hx except CAF on coumadin AC followed by Dr. Lovena Le. Recent admission for pyelonephritis. 2D during that hospitalization showed new decline in LVEF 3540% (05/10/15). Her current INR is 1.5. Still having mild CP (started yesterday on and off), with trop 44 and mild Lat ST elevation. Exam benign. On IV hep. Plan urgent cath this AM.  I have reviewed the risks, indications, and alternatives to cardiac catheterization, possible angioplasty, and stenting with the patient. Risks include but are not limited to bleeding, infection, vascular injury, stroke, myocardial infection, arrhythmia, kidney injury, radiation-related injury in the case of prolonged fluoroscopy use, emergency cardiac surgery, and death. The patient understands the risks of serious complication is 1-2 in 123XX123 with diagnostic cardiac cath and 1-2% or less with angioplasty/stenting.   Lorretta Harp, M.D., [place], [facility], [place] [address] [place], [place]  [address]  [phone] 05/17/2015 11:24 AM

## 2015-05-17 NOTE — Progress Notes (Signed)
Pt A&O x 3 but forgetful who states she can not sleep in hospital and is requesting sleeping pill.  Pt has Ativan on MAR prn and on home meds but pt states she doesn't take anything for sleep at home because she has no problems sleeping at home, only here.  Discussed with Almyra Deforest PA who is here and just spoke with pt.  PA advised he ordered Lasix for early AM but to give earlier if pt develops worsening SOB.  Pt's lungs are diminished with no crackles or wheezes.  Mild SOB with getting up to [facility].  2+ pitting edema to bil ankles/feet.  Weak DP.  He says to give her only 0.25 mg Ativan for sleep and he d/c'd the SQ heparin and ordered SCD's.  Pt high fall risk due to frequent BSC use with urgency of bowel & bladder. Pt denies burning on urination.  States she's had 3 BM's since given meds earlier today for constipation.  Sometimes she calls for assist and sometimes she does not.  Shanti NT sitting near this pt's room and aware of need to monitor closely.  Bed exit alarm on, call light in reach, nightlight on, reminded pt not to get up without assist.  Pt voiced understanding.

## 2015-05-17 NOTE — Progress Notes (Signed)
TR BAND REMOVAL  LOCATION:    right radial  DEFLATED PER PROTOCOL:    Yes.    TIME BAND OFF / DRESSING APPLIED:    1700   SITE UPON ARRIVAL:    Level 0  SITE AFTER BAND REMOVAL:    Level 0  CIRCULATION SENSATION AND MOVEMENT:    Within Normal Limits   Yes.    COMMENTS:   Right radial site checked q 30 minutes after band removed with no noted change in assessment.

## 2015-05-17 NOTE — Progress Notes (Signed)
[place] for Heparin  Indication: chest pain/ACS  Allergies  Allergen Reactions  . Procaine Hcl Anaphylaxis    Patient Measurements: Height: 5\' 9"  (175.3 cm) Weight: 225 lb 12.8 oz (102.422 kg) IBW/kg (Calculated) : 66.2   Vital Signs: Temp: 98.5 F (36.9 C) (03/17 0832) Temp Source: Oral (03/17 0832) BP: 135/60 mmHg (03/17 0832) Pulse Rate: 79 (03/17 0832)  Labs:  Recent Labs  05/16/15 1925 05/16/15 1930 05/17/15 0146 05/17/15 0708 05/17/15 0843  HGB 11.0*  --   --  9.8*  --   HCT 34.5*  --   --  30.6*  --   PLT 458*  --   --  466*  --   LABPROT 18.5*  --   --   --   --   INR 1.54*  --   --   --   --   HEPARINUNFRC  --   --   --   --  0.60  CREATININE 0.96  --   --  1.13*  --   TROPONINI  --  44.57* 42.66* 47.20*  --     Estimated Creatinine Clearance: 56.5 mL/min (by C-G formula based on Cr of 1.13).  Assessment: 39 yof admitted 05/16/2015 with CP. She continues on heparin IV for CP and also for afib while coumadin is on hold. Heparin level is therapeutic at 0.6. No bleeding noted.   PTA coumadin dose = 2.5mg  daily (last dose unknown) INR on admit 1.54. Hgb 11, PLT 458  Goal of Therapy:  Heparin level 0.3-0.7 units/ml Monitor platelets by anticoagulation protocol: Yes   Plan:  - Continue heparin gtt 1300 units/hr - Check an 8 hour heparin level to confirm dosing - F/u cath and warfarin restart plans  Salome Arnt, PharmD, BCPS Pager # [phone] 05/17/2015 10:09 AM

## 2015-05-17 NOTE — Interval H&P Note (Signed)
Cath Lab Visit (complete for each Cath Lab visit)  Clinical Evaluation Leading to the Procedure:   ACS: Yes.    Non-ACS:    Anginal Classification: CCS IV  Anti-ischemic medical therapy: Minimal Therapy (1 class of medications)  Non-Invasive Test Results: No non-invasive testing performed  Prior CABG: No previous CABG      History and Physical Interval Note:  05/17/2015 11:30 AM  Cliff  has presented today for surgery, with the diagnosis of positive troponins  The various methods of treatment have been discussed with the patient and family. After consideration of risks, benefits and other options for treatment, the patient has consented to  Procedure(s): Left Heart Cath and Coronary Angiography (N/A) as a surgical intervention .  The patient's history has been reviewed, patient examined, no change in status, stable for surgery.  I have reviewed the patient's chart and labs.  Questions were answered to the patient's satisfaction.     Legend Pecore S.

## 2015-05-17 NOTE — Progress Notes (Signed)
[facility]                                                                              Patient Demographics  [name] [name], is a 75 y.o. female, DOB - May 01, 1940, [id number]  Admit date - 05/16/2015   Admitting Physician Ivor Costa, MD  Outpatient Primary MD for the patient is [name], MD  LOS - 1  days    Chief Complaint  Patient presents with  . Weakness       Brief HPI   [name] [name] is a 75 y.o. 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 Patient started developing chest pain around afternoon yesterday which was pressure-like and enveloping the whole chest with diaphoresis. Patient with rectal bleeding to go to her PCP and was referred to the ER. In the ER EKG shows A. fib with nonspecific ST changes. Troponin was markedly elevated around 44. Patient INR was subtherapeutic patient being on Coumadin. On call cardiologist Dr. Philbert Riser was consulted and patient had been admitted for non-ST elevation MI. Patient's chest pain has improved at this time.   Assessment & Plan    Principal Problem:   Non-ST elevation MI (NSTEMI) (HCC) - Troponin elevated 47, chest pain currently dissolved on nitro drip, heparin drip - Cardiology consulted, planning cardiac cath - Continue NPO   Active Problems:   Seizure disorder (HCC) - Continue Dilantin    A-fib (HCC) - Currently rate controlled, continue Cardizem, metoprolol - CHADS vasc  4, currently on heparin infusion -Hold Coumadin     Acute on chronic 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HCC) - Continue nitro drip, - 2-D echo 3/10 showed EF of 35-40% with diffuse hypokinesis, follow cardiac cath report - will likely need IV diuresis, will follow cardiology recommendation    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 [START ON 05/18/2015] aspirin  81 mg Oral Pre-Cath  . [MAR Hold] aspirin EC  325 mg Oral Daily  . [MAR Hold] atorvastatin  40 mg Oral QHS  . bisacodyl  10 mg Rectal Once  . [MAR Hold] calcium-vitamin D  1 tablet Oral Q breakfast  . [MAR Hold] cephALEXin  500 mg Oral TID AC & HS  . [MAR Hold] diltiazem  240 mg Oral Daily  . [MAR Hold] docusate sodium  100 mg Oral BID  . [MAR Hold] escitalopram  20 mg Oral Daily  . [MAR Hold] insulin aspart  0-9 Units Subcutaneous Q4H  . [MAR Hold] insulin glargine  15 Units Subcutaneous Daily  . [MAR Hold] metoprolol tartrate  25 mg Oral BID  . [  MAR Hold] phenytoin  300 mg Oral Daily  . [MAR Hold] polyethylene glycol  17 g Oral Daily  . sodium chloride flush  3 mL Intravenous Q12H   Continuous Infusions: . [START ON 05/18/2015] sodium chloride    . sodium chloride 10 mL/hr (05/17/15 1132)  . bivalirudin (ANGIOMAX) infusion 5 mg/mL Stopped (05/17/15 1240)  . heparin Stopped (05/17/15 1050)  . heparin    . [MAR Hold] nitroGLYCERIN 5 mcg/min (05/17/15 0126)  . tirofiban Stopped (05/17/15 1240)   PRN Meds:.sodium chloride, sodium chloride, [MAR Hold] acetaminophen **OR** [MAR Hold] acetaminophen, bivalirudin (ANGIOMAX) infusion 5 mg/mL, bivalirudin, fentaNYL, heparin, heparin, lidocaine (PF), [MAR Hold] LORazepam, midazolam, [MAR Hold]  morphine injection, nitroGLYCERIN, [MAR Hold] ondansetron **OR** [MAR Hold] ondansetron (ZOFRAN) IV, Radial Cocktail/Verapamil only, sodium chloride flush, tirofiban, tirofiban, verapamil   Antibiotics   Anti-infectives    Start      Dose/Rate Route Frequency Ordered Stop   05/17/15 0130  [MAR Hold]  cephALEXin (KEFLEX) capsule 500 mg     (MAR Hold since 05/17/15 1106)   500 mg Oral 3 times daily before meals & bedtime 05/17/15 0053 05/20/15 0629   05/16/15 2145  cefTRIAXone (ROCEPHIN) 1 g in dextrose 5 % 50 mL IVPB     1 g 100 mL/hr over 30 Minutes Intravenous  Once 05/16/15 2139 05/16/15 2301        Subjective:   [name] [name] was seen and examined today. Patient seen and examined, chest pain resolved at the time of examination. No fevers or chills.  Patient denies dizziness, abdominal pain, N/V/D/C, new weakness, numbess, tingling. No acute events overnight.    Objective:   Filed Vitals:   05/17/15 0003 05/17/15 0402 05/17/15 0832 05/17/15 1124  BP:  145/64 135/60   Pulse:  70 79   Temp:  99 F (37.2 C) 98.5 F (36.9 C)   TempSrc:  Oral Oral   Resp:  19    Height: 5\' 9"  (1.753 m)     Weight: 102.422 kg (225 lb 12.8 oz)     SpO2:  98% 97% 98%    Intake/Output Summary (Last 24 hours) at 05/17/15 1247 Last data filed at 05/17/15 1050  Gross per 24 hour  Intake  240.5 ml  Output    700 ml  Net -459.5 ml     Wt Readings from Last 3 Encounters:  05/17/15 102.422 kg (225 lb 12.8 oz)  05/16/15 103.329 kg (227 lb 12.8 oz)  05/11/15 104.373 kg (230 lb 1.6 oz)     Exam  General: Alert and oriented x 3, NAD  HEENT:  PERRLA, EOMI  Neck: Supple, no JVD  CVS: S1 S2 auscultated,irregularly irregular  Respiratory: Clear to auscultation bilaterally, no wheezing, rales or rhonchi  Abdomen: Soft, nontender, nondistended, + bowel sounds  Ext: no cyanosis clubbing, 2+ edema  Neuro: AAOx3, Cr N's II- XII. Strength 5/5 upper and lower extremities bilaterally  Skin: No rashes  Psych: Normal affect and demeanor, alert and oriented x3    Data Reviewed:  I have personally reviewed following labs and imaging studies  Micro Results No results found for this or any previous visit (from the past 240  hour(s)).  Radiology Reports Ct Abdomen Pelvis Wo Contrast  05/16/2015  CLINICAL DATA:  Belching and constipation. EXAM: CT ABDOMEN AND PELVIS WITHOUT CONTRAST TECHNIQUE: Multidetector CT imaging of the abdomen and pelvis was performed following the standard protocol without IV contrast. COMPARISON:  05/06/2015 FINDINGS: Lower chest and abdominal wall: There is new  septal thickening and pleural fluid at the bases. Hepatobiliary: No focal liver abnormality.Cholelithiasis without signs of cholecystitis. Calcification near the pancreatic head is likely in the GDA or pancreas. No evidence of calcified choledocholithiasis. Pancreas: Atrophy with 15 mm pancreatic tail lesion. This appeared cystic on recent enhanced imaging. Spleen: Unremarkable. Adrenals/Urinary Tract:  Negative adrenals. Persistent asymmetric right perinephric edema without hydronephrosis. There are bilateral renal cysts and patchy right renal cortical scarring. No hydronephrosis or ureteral calculus. Persistent pericystic haziness and mural gas at the apex. Reproductive:Negative Stomach/Bowel:  No obstruction. No inflammation. Vascular/Lymphatic: No acute vascular abnormality. No mass or adenopathy. Peritoneal: No ascites or pneumoperitoneum. Musculoskeletal: Degenerative changes and levoscoliosis. Remote intertrochanteric right femur fracture with repair and healing. IMPRESSION: 1. Emphysematous cystitis. Persistent asymmetric right perinephric edema which could reflect pyelonephritis. 2. CHF. 3. Cholelithiasis. 4. 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 Reference CT report 03/06/2015 for follow-up recommendations. Electronically Signed   By: Monte Fantasia M.D.   On: 05/16/2015 21:59   Ct Abdomen Pelvis W Contrast  05/06/2015  CLINICAL DATA:  Nausea, vomiting and diarrhea. Patient fell this morning with decreased blood pressure. History of diabetes and hypertension. EXAM: CT ABDOMEN AND PELVIS WITH CONTRAST TECHNIQUE: Multidetector CT imaging of the abdomen and  pelvis was performed using the standard protocol following bolus administration of intravenous contrast. CONTRAST:  39mL OMNIPAQUE IOHEXOL 300 MG/ML  SOLN COMPARISON:  CT 03/06/2015. FINDINGS: Lower chest: Stable atelectasis or scarring in both lung bases. Cardiomegaly and coronary artery calcifications are noted. There is no pleural or pericardial effusion. Hepatobiliary: The liver has a stable appearance without focal abnormality. Multiple small calcified gallstones are again noted. There is no gallbladder wall thickening or significant biliary dilatation. Pancreas: The pancreas appears unchanged with diffuse atrophy and multiple small cystic lesions, measuring up to 1.7 cm in the pancreatic tail on image number 27. No surrounding inflammatory change or pancreatic ductal dilatation. Spleen: Normal in size without focal abnormality. Adrenals/Urinary Tract: The adrenal glands appear stable without suspicious findings. No renal cortical scarring and atrophy are again noted, right-greater-than-left. There are several low-density renal lesions which are grossly stable. There is new mild dilatation of the right renal pelvis with associated wall thickening and mildly delayed contrast excretion. There is also new asymmetric perinephric soft tissue stranding on the right. No ureteral dilatation or urinary tract calculus is seen. There is new air within the urinary bladder lumen. Some of this air may be contained within the bladder wall. No air is seen within the kidneys or ureters. There are no inflammatory changes surrounding the bladder. Stomach/Bowel: No evidence of bowel wall thickening, distention or surrounding inflammatory change. The appendix appears normal. There is mild distal colonic diverticulosis. Vascular/Lymphatic: There are no enlarged abdominal or pelvic lymph nodes. There is diffuse atherosclerosis of the aorta, its branches and the iliac arteries. Reproductive: Unremarkable.  No evidence of adnexal mass.  Other: No generalized ascites or free air. The anterior abdominal wall appears unremarkable. Musculoskeletal: No acute or significant osseous findings. There are stable degenerative changes throughout the spine associated with a mild convex left scoliosis. Previous right proximal femoral ORIF. Chronic T11 compression deformity appears unchanged. IMPRESSION: 1. Interval change in the appearance of the right kidney with moderate perinephric soft tissue stranding, pelvic wall thickening and delayed contrast excretion. No evidence of obstructing calculus. These findings could be secondary to a recently passed calculus, obstruction from previously suspected papillary necrosis or pyelonephritis. 2. Air within the urinary bladder lumen and possibly the right bladder wall. Emphysematous cystitis  should be considered in this diabetic if bladder instrumentation has not been recently performed. Correlation with urine analysis recommended. 3. Cholelithiasis without evidence of cholecystitis or biliary dilatation. 4. 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 Please refer to follow up recommendations issued 2 months ago. Electronically Signed   By: Richardean Sale M.D.   On: 05/06/2015 16:57   Dg Chest Port 1 View  05/16/2015  CLINICAL DATA:  Weakness and chest pain all day EXAM: PORTABLE CHEST 1 VIEW COMPARISON:  May 07, 2015 FINDINGS: The heart size and mediastinal contours are stable. The heart size is enlarged. There is pulmonary edema. There is no focal pneumonia or pleural effusion. There is mild atelectasis of both lung bases. The visualized skeletal structures are unremarkable. IMPRESSION: Congestive heart failure. Electronically Signed   By: Abelardo Diesel M.D.   On: 05/16/2015 21:02   Dg Chest Port 1 View  05/07/2015  CLINICAL DATA:  Sepsis, UTI, abdominal pain EXAM: PORTABLE CHEST 1 VIEW COMPARISON:  08/23/2014 and 11/21/2012 FINDINGS: Cardiomegaly again noted. Chronic elevation of the  right hemidiaphragm. Central mild vascular congestion and mild interstitial prominence bilateral without convincing pulmonary edema. Stable old fracture of the right sixth rib. No segmental infiltrate. Mild basilar atelectasis. Suboptimal study due to patient's large body habitus IMPRESSION: Central mild vascular congestion and mild interstitial prominence bilaterally without convincing pulmonary edema. Suboptimal study due to patient's large body habitus. Chronic elevation of the right hemidiaphragm. No segmental infiltrate. Mild basilar atelectasis. Electronically Signed   By: Lahoma Crocker M.D.   On: 05/07/2015 07:55    CBC  Recent Labs Lab 05/16/15 1925 05/17/15 0708  WBC 15.5* 18.2*  HGB 11.0* 9.8*  HCT 34.5* 30.6*  PLT 458* 466*  MCV 93.2 91.3  MCH 29.7 29.3  MCHC 31.9 32.0  RDW 14.0 14.1  LYMPHSABS 2.3 1.6  MONOABS 0.8 1.5*  EOSABS 0.3 0.0  BASOSABS 0.0 0.0    Chemistries   Recent Labs Lab 05/11/15 0535 05/16/15 1925 05/17/15 0708  NA 140 136 137  K 3.4* 3.2* 4.3  CL 103 96* 96*  CO2 23 27 30   GLUCOSE 152* 166* 198*  BUN 18 11 9   CREATININE 1.28* 0.96 1.13*  CALCIUM 8.7* 8.2* 8.4*  AST  --  163* 142*  ALT  --  22 21  ALKPHOS  --  101 103  BILITOT  --  0.5 0.8   ------------------------------------------------------------------------------------------------------------------ estimated creatinine clearance is 56.5 mL/min (by C-G formula based on Cr of 1.13). ------------------------------------------------------------------------------------------------------------------ No results for input(s): HGBA1C in the last 72 hours. ------------------------------------------------------------------------------------------------------------------ No results for input(s): CHOL, HDL, LDLCALC, TRIG, CHOLHDL, LDLDIRECT in the last 72 hours. ------------------------------------------------------------------------------------------------------------------ No results for input(s):  TSH, T4TOTAL, T3FREE, THYROIDAB in the last 72 hours.  Invalid input(s): FREET3 ------------------------------------------------------------------------------------------------------------------ No results for input(s): VITAMINB12, FOLATE, FERRITIN, TIBC, IRON, RETICCTPCT in the last 72 hours.  Coagulation profile  Recent Labs Lab 05/11/15 0535 05/16/15 1925  INR 3.60* 1.54*    No results for input(s): DDIMER in the last 72 hours.  Cardiac Enzymes  Recent Labs Lab 05/16/15 1930 05/17/15 0146 05/17/15 0708  TROPONINI 44.57* 42.66* 47.20*   ------------------------------------------------------------------------------------------------------------------ Invalid input(s): POCBNP   Recent Labs  05/16/15 2344 05/17/15 0404 05/17/15 0737  GLUCAP 156* 171* 175*     Marielys Trinidad M.D. [facility] 05/17/2015, 12:47 PM  Pager: [phone] Between 7am to 7pm - call Pager - [phone]  After 7pm go to [email or web address] - password [email or web address]  Call night coverage person covering after 7pm

## 2015-05-17 NOTE — Progress Notes (Signed)
Patient Name: [name] [name] Date of Encounter: 05/17/2015  Primary Cardiologist: Dr.    Tomasa Hose Problem:   Non-ST elevation MI (NSTEMI) [facility]) Active Problems:   Seizure disorder ([place])   A-fib ([place])   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[place])   Type 2 diabetes mellitus with vascular disease ([place])   Non-ST elevated myocardial infarction ([place])    SUBJECTIVE  Started having chest pain yesterday along with SOB. Denies any blood in the stool or urine.   CURRENT MEDS . aspirin EC  325 mg Oral Daily  . atorvastatin  40 mg Oral QHS  . bisacodyl  10 mg Rectal Once  . calcium-vitamin D  1 tablet Oral Q breakfast  . cephALEXin  500 mg Oral TID AC & HS  . diltiazem  240 mg Oral Daily  . docusate sodium  100 mg Oral BID  . escitalopram  20 mg Oral Daily  . insulin aspart  0-9 Units Subcutaneous Q4H  . insulin glargine  15 Units Subcutaneous Daily  . [START ON 05/18/2015] metoprolol tartrate  25 mg Oral BID  . phenytoin  300 mg Oral Daily  . polyethylene glycol  17 g Oral Daily    OBJECTIVE  Filed Vitals:   05/16/15 2333 05/17/15 0003 05/17/15 0402 05/17/15 0832  BP: 126/81  145/64 135/60  Pulse: 51  70 79  Temp: 97.9 F (36.6 C)  99 F (37.2 C) 98.5 F (36.9 C)  TempSrc: Oral  Oral Oral  Resp: 22  19   Height:  5\' 9"  (1.753 m)    Weight:  225 lb 12.8 oz (102.422 kg)    SpO2: 96%  98% 97%    Intake/Output Summary (Last 24 hours) at 05/17/15 1016 Last data filed at 05/17/15 0838  Gross per 24 hour  Intake      0 ml  Output    700 ml  Net   -700 ml   Filed Weights   05/16/15 1847 05/17/15 0003  Weight: 227 lb (102.967 kg) 225 lb 12.8 oz (102.422 kg)    PHYSICAL EXAM  General: Pleasant, NAD. Neuro: Alert and oriented X 3. Moves all extremities spontaneously. Psych: Normal affect. HEENT:  Normal  Neck: Supple without bruits or JVD. Lungs:  Resp regular and unlabored, CTA. Heart: RRR no s3, s4, or murmurs. Abdomen: Soft, non-tender, non-distended, BS + x 4.  Extremities: No  clubbing, cyanosis. DP/PT/Radials 2+ and equal bilaterally. 1-2+ pitting edema  Accessory Clinical Findings  CBC  Recent Labs  05/16/15 1925 05/17/15 0708  WBC 15.5* 18.2*  NEUTROABS 12.1* 15.1*  HGB 11.0* 9.8*  HCT 34.5* 30.6*  MCV 93.2 91.3  PLT 458* 123XX123*   Basic Metabolic Panel  Recent Labs  05/16/15 1925 05/17/15 0708  NA 136 137  K 3.2* 4.3  CL 96* 96*  CO2 27 30  GLUCOSE 166* 198*  BUN 11 9  CREATININE 0.96 1.13*  CALCIUM 8.2* 8.4*   Liver Function Tests  Recent Labs  05/16/15 1925 05/17/15 0708  AST 163* 142*  ALT 22 21  ALKPHOS 101 103  BILITOT 0.5 0.8  PROT 7.1 6.4*  ALBUMIN 2.9* 2.5*   Cardiac Enzymes  Recent Labs  05/16/15 1930 05/17/15 0146 05/17/15 0708  TROPONINI 44.57* 42.66* 47.20*    TELE afib    ECG  afib with minimal ST elevation in lateral leads  Echocardiogram 05/10/2015  LV EF: 35% - 40%  ------------------------------------------------------------------- Indications: Atrial fibrillation - 427.31.  ------------------------------------------------------------------- History: PMH: Elevated Troponin. EKG changes. Risk factors:  Hypertension. Diabetes mellitus. Dyslipidemia.  ------------------------------------------------------------------- Study Conclusions  - Left ventricle: The cavity size was normal. There was moderate  concentric hypertrophy. Systolic function was moderately reduced.  The estimated ejection fraction was in the range of 35% to 40%.  Diffuse hypokinesis. The study was not technically sufficient to  allow evaluation of LV diastolic dysfunction due to atrial  fibrillation. - Aortic valve: Trileaflet; normal thickness leaflets. There was no  regurgitation. - Aortic root: The aortic root was normal in size. - Mitral valve: Mildly thickened leaflets . There was mild  regurgitation. - Left atrium: The atrium was moderately dilated. - Right ventricle: The cavity size was  normal. Wall thickness was  normal. Systolic function was normal. - Right atrium: The atrium was normal in size. - Tricuspid valve: There was moderate regurgitation. - Pulmonic valve: There was no regurgitation. - Pulmonary arteries: Systolic pressure was mildly to moderately  increased. PA peak pressure: 45 mm Hg (S). - Inferior vena cava: The vessel was normal in size. - Pericardium, extracardiac: There was no pericardial effusion.     Radiology/Studies  Ct Abdomen Pelvis Wo Contrast  05/16/2015  CLINICAL DATA:  Belching and constipation. EXAM: CT ABDOMEN AND PELVIS WITHOUT CONTRAST TECHNIQUE: Multidetector CT imaging of the abdomen and pelvis was performed following the standard protocol without IV contrast. COMPARISON:  05/06/2015 FINDINGS: Lower chest and abdominal wall: There is new septal thickening and pleural fluid at the bases. Hepatobiliary: No focal liver abnormality.Cholelithiasis without signs of cholecystitis. Calcification near the pancreatic head is likely in the GDA or pancreas. No evidence of calcified choledocholithiasis. Pancreas: Atrophy with 15 mm pancreatic tail lesion. This appeared cystic on recent enhanced imaging. Spleen: Unremarkable. Adrenals/Urinary Tract:  Negative adrenals. Persistent asymmetric right perinephric edema without hydronephrosis. There are bilateral renal cysts and patchy right renal cortical scarring. No hydronephrosis or ureteral calculus. Persistent pericystic haziness and mural gas at the apex. Reproductive:Negative Stomach/Bowel:  No obstruction. No inflammation. Vascular/Lymphatic: No acute vascular abnormality. No mass or adenopathy. Peritoneal: No ascites or pneumoperitoneum. Musculoskeletal: Degenerative changes and levoscoliosis. Remote intertrochanteric right femur fracture with repair and healing. IMPRESSION: 1. Emphysematous cystitis. Persistent asymmetric right perinephric edema which could reflect pyelonephritis. 2. CHF. 3.  Cholelithiasis. 4. 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 Reference CT report 03/06/2015 for follow-up recommendations. Electronically Signed   By: Monte Fantasia M.D.   On: 05/16/2015 21:59   Ct Abdomen Pelvis W Contrast  05/06/2015  CLINICAL DATA:  Nausea, vomiting and diarrhea. Patient fell this morning with decreased blood pressure. History of diabetes and hypertension. EXAM: CT ABDOMEN AND PELVIS WITH CONTRAST TECHNIQUE: Multidetector CT imaging of the abdomen and pelvis was performed using the standard protocol following bolus administration of intravenous contrast. CONTRAST:  26mL OMNIPAQUE IOHEXOL 300 MG/ML  SOLN COMPARISON:  CT 03/06/2015. FINDINGS: Lower chest: Stable atelectasis or scarring in both lung bases. Cardiomegaly and coronary artery calcifications are noted. There is no pleural or pericardial effusion. Hepatobiliary: The liver has a stable appearance without focal abnormality. Multiple small calcified gallstones are again noted. There is no gallbladder wall thickening or significant biliary dilatation. Pancreas: The pancreas appears unchanged with diffuse atrophy and multiple small cystic lesions, measuring up to 1.7 cm in the pancreatic tail on image number 27. No surrounding inflammatory change or pancreatic ductal dilatation. Spleen: Normal in size without focal abnormality. Adrenals/Urinary Tract: The adrenal glands appear stable without suspicious findings. No renal cortical scarring and atrophy are again noted, right-greater-than-left. There are several low-density renal lesions which are grossly stable.  There is new mild dilatation of the right renal pelvis with associated wall thickening and mildly delayed contrast excretion. There is also new asymmetric perinephric soft tissue stranding on the right. No ureteral dilatation or urinary tract calculus is seen. There is new air within the urinary bladder lumen. Some of this air may be contained within the bladder wall. No air is seen within the kidneys  or ureters. There are no inflammatory changes surrounding the bladder. Stomach/Bowel: No evidence of bowel wall thickening, distention or surrounding inflammatory change. The appendix appears normal. There is mild distal colonic diverticulosis. Vascular/Lymphatic: There are no enlarged abdominal or pelvic lymph nodes. There is diffuse atherosclerosis of the aorta, its branches and the iliac arteries. Reproductive: Unremarkable.  No evidence of adnexal mass. Other: No generalized ascites or free air. The anterior abdominal wall appears unremarkable. Musculoskeletal: No acute or significant osseous findings. There are stable degenerative changes throughout the spine associated with a mild convex left scoliosis. Previous right proximal femoral ORIF. Chronic T11 compression deformity appears unchanged. IMPRESSION: 1. Interval change in the appearance of the right kidney with moderate perinephric soft tissue stranding, pelvic wall thickening and delayed contrast excretion. No evidence of obstructing calculus. These findings could be secondary to a recently passed calculus, obstruction from previously suspected papillary necrosis or pyelonephritis. 2. Air within the urinary bladder lumen and possibly the right bladder wall. Emphysematous cystitis should be considered in this diabetic if bladder instrumentation has not been recently performed. Correlation with urine analysis recommended. 3. Cholelithiasis without evidence of cholecystitis or biliary dilatation. 4. 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 Please refer to follow up recommendations issued 2 months ago. Electronically Signed   By: Richardean Sale M.D.   On: 05/06/2015 16:57   Dg Chest Port 1 View  05/16/2015  CLINICAL DATA:  Weakness and chest pain all day EXAM: PORTABLE CHEST 1 VIEW COMPARISON:  May 07, 2015 FINDINGS: The heart size and mediastinal contours are stable. The heart size is enlarged. There is pulmonary edema. There  is no focal pneumonia or pleural effusion. There is mild atelectasis of both lung bases. The visualized skeletal structures are unremarkable. IMPRESSION: Congestive heart failure. Electronically Signed   By: Abelardo Diesel M.D.   On: 05/16/2015 21:02   Dg Chest Port 1 View  05/07/2015  CLINICAL DATA:  Sepsis, UTI, abdominal pain EXAM: PORTABLE CHEST 1 VIEW COMPARISON:  08/23/2014 and 11/21/2012 FINDINGS: Cardiomegaly again noted. Chronic elevation of the right hemidiaphragm. Central mild vascular congestion and mild interstitial prominence bilateral without convincing pulmonary edema. Stable old fracture of the right sixth rib. No segmental infiltrate. Mild basilar atelectasis. Suboptimal study due to patient's large body habitus IMPRESSION: Central mild vascular congestion and mild interstitial prominence bilaterally without convincing pulmonary edema. Suboptimal study due to patient's large body habitus. Chronic elevation of the right hemidiaphragm. No segmental infiltrate. Mild basilar atelectasis. Electronically Signed   By: Lahoma Crocker M.D.   On: 05/07/2015 07:55    ASSESSMENT AND PLAN  1. NSTEMI  - mild ST elevation in lateral leads last night, will repeat an EKG now, place on cath board for next case  - given elevated trop, plan for urgent cath today. She denies any bleeding issue  - Risk and benefit of procedure explained to the patient who display clear understanding and agree to proceed. Discussed with patient possible procedural risk include bleeding, vascular injury, renal injury, arrythmia, MI, stroke and loss of limb or life.  - INR 1.5 yesterday.  2. Chronic atrial fibrillation  - CHA2DS2-Vasc score 5 (HTN, DM, age, female, likely HF)  - rate controlled  3. Acute systolic HF  - 2+ pitting edema on exam, will need diureses after cath  - Note recent admission for sepsis and pyelo, echo obtained during that admission shows EF went down to 35-40%. Cardiology was not consulted during  that admission  4. HTN  5. DM    Signed, Almyra Deforest PA-C Pager: [phone]  Agree with note by Almyra Deforest PA-C  Pt admitted last PM with CP/SOB. No prior cardiac Hx except CAF on coumadin AC followed by Dr. Lovena Le. Recent admission for pyelonephritis. 2D during that hospitalization showed new decline in LVEF 3540% (05/10/15). Her current INR is 1.5. Still having mild CP (started yesterday on and off), with trop 44 and mild Lat ST elevation. Exam benign. On IV hep. Plan urgent cath this AM.  I have reviewed the risks, indications, and alternatives to cardiac catheterization, possible angioplasty, and stenting with the patient. Risks include but are not limited to bleeding, infection, vascular injury, stroke, myocardial infection, arrhythmia, kidney injury, radiation-related injury in the case of prolonged fluoroscopy use, emergency cardiac surgery, and death. The patient understands the risks of serious complication is 1-2 in 123XX123 with diagnostic cardiac cath and 1-2% or less with angioplasty/stenting.   Lorretta Harp, M.D., [place], [facility], [place] [address] [place], [place]  [address]  [phone] 05/17/2015 11:24 AM

## 2015-05-17 NOTE — Progress Notes (Signed)
ANTICOAGULATION CONSULT NOTE  Pharmacy Consult:  Restart Coumadin Indication:  H/o afib  Allergies  Allergen Reactions  . Procaine Hcl Anaphylaxis    Patient Measurements: Height: 5\' 9"  (175.3 cm) Weight: 225 lb 12.8 oz (102.422 kg) IBW/kg (Calculated) : 66.2   Vital Signs: Temp: 97.9 F (36.6 C) (03/17 1334) Temp Source: Oral (03/17 1334) BP: 156/64 mmHg (03/17 1430) Pulse Rate: 76 (03/17 1445)  Labs:  Recent Labs  05/16/15 1925  05/17/15 0146 05/17/15 0708 05/17/15 0843 05/17/15 1448  HGB 11.0*  --   --  9.8*  --  9.5*  HCT 34.5*  --   --  30.6*  --  29.7*  PLT 458*  --   --  466*  --  426*  LABPROT 18.5*  --   --   --   --   --   INR 1.54*  --   --   --   --   --   HEPARINUNFRC  --   --   --   --  0.60  --   CREATININE 0.96  --   --  1.13*  --  1.21*  TROPONINI  --   < > 42.66* 47.20*  --  48.33*  < > = values in this interval not displayed.  Estimated Creatinine Clearance: 52.8 mL/min (by C-G formula based on Cr of 1.21).  Assessment: 8 yof admitted 05/16/2015 with CP. She was on coumadin PTA for h/o afib.  Coumadin was held & started on IV heparin for NSTEMI.  Now s/p cardiac cath/balloon angioplasty, IV heparin dc'd and pharmacy consulted to  restart coumadin. Cardiology noted plan to start plavix. No bleeding noted.   PTA coumadin dose = 1.25mg  daily (last dose on 05/15/15) INR = 1.54 on admit date 05/16/15. Today Hgb 9.5, PLT 426k  Goal of Therapy:  INR = 2-3  Monitor platelets by anticoagulation protocol: Yes   Plan:  Give Coumadin 2.5 mg tonight x 1 Daily INR  Nicole Cella, RPh Clinical Pharmacist Pager: [phone] 05/17/2015 5:00 PM

## 2015-05-17 NOTE — Progress Notes (Signed)
Discussed with Dr. Gwenlyn Found and patient, she denies any h/o rectal bleeding, will load with plavix today, start ASA, plavix and coumadin, likely drop ASA after 30 days and continue coumadin and plavix.   Hilbert Corrigan PA Pager: [phone]

## 2015-05-17 NOTE — H&P (Signed)
[facility] History and Physical  [name] [name] [id number] DOB: 19-Dec-1940 DOA: 05/16/2015  Referring physician: Patient was transferred from [facility]. PCP: [name], MD  Specialists: None.  Chief Complaint: Chest pain.  HPI: [name] [name] is a 75 y.o. 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 Patient started developing chest pain around afternoon yesterday which was pressure-like and enveloping the whole chest with diaphoresis. Patient with rectal bleeding to go to her PCP and was referred to the ER. In the ER EKG shows A. fib with nonspecific ST changes. Troponin was markedly elevated around 44. Patient INR was subtherapeutic patient being on Coumadin. On call cardiologist Dr. Philbert Riser was consulted and patient had been admitted for non-ST elevation MI. Patient's chest pain has improved at this time.   Review of Systems: As presented in the history of presenting illness, rest negative.  Past Medical History  Diagnosis Date  . Blindness of left eye   . Eye muscle weakness     Right eye weakness after cataract surgery  . Common migraine     ?hx of  . DIABETES MELLITUS, TYPE II 03/27/2006    dr [name]  . HYPERLIPIDEMIA 03/27/2006  . 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 HYPERTENSION 03/27/2006  . Atrial fibrillation ([place])     SVT dx 2007, cath 2007 mild CAD, then had an cardioversion, ablation; still on coumadin , has occ palpitation, EKG 03-2010 NSR  . Headache(784.0) 05/07/2009  . LUNG NODULE 09/01/2006    Excision, Bx Benighn  . Osteopenia 2004    Dexa 2004 showed Osteopenia, DEXA 03/2007 normal  . Seizures ([place])   . Intertrochanteric fracture of right hip ([place]) 07/13/2012  . 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 GERD (gastroesophageal reflux disease) 10/05/2011  . Recurrent urinary tract  infection     Seeing Urology  . 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[place]) [id number]   Past Surgical History  Procedure Laterality Date  . Shoulder surgery    . Femur im nail Right 07/15/2012    Procedure: INTRAMEDULLARY (IM) NAIL HIP;  Surgeon: Johnny Bridge, MD;  Location: [place];  Service: Orthopedics;  Laterality: Right;   Social History:  reports that she quit smoking about 37 years ago. Her smoking use included Cigarettes. She has never used smokeless tobacco. She reports that she does not drink alcohol or use illicit drugs. Where does patient live At home. Can patient participate in ADLs? Yes.  Allergies  Allergen Reactions  . Procaine Hcl Anaphylaxis    Family History:  Family History  Problem Relation Age of Onset  . Diabetes Father   . Heart attack Father 66  . Diabetes Sister   . Cancer Neg Hx     no hx of colon or breast cancer      Prior to Admission medications   Medication Sig Start Date End Date Taking? Authorizing Provider  atorvastatin (LIPITOR) 40 MG tablet Take 1 tablet (40 mg total) by mouth at bedtime. 08/07/14   Colon Branch, MD  betamethasone dipropionate (DIPROLENE) 0.05 % cream Apply topically as needed.    Historical Provider, MD  calcium-vitamin D (OSCAL WITH D) 500-200 MG-UNIT per tablet Take 1 tablet by mouth daily.    Historical Provider, MD  cephALEXin (KEFLEX) 500 MG capsule Take 1 capsule (500 mg total) by mouth 4 (four) times daily. 05/11/15 05/19/15  Nishant  Dhungel, MD  ciprofloxacin (CIPRO) 500 MG tablet Take 500 mg by mouth.    Historical Provider, MD  diltiazem (CARDIZEM CD) 360 MG 24 hr capsule Take 1 capsule (360 mg total) by mouth daily. Patient taking differently: Take 240 mg by mouth daily.  05/11/15   Nishant Dhungel, MD  escitalopram (LEXAPRO) 10 MG tablet Take 2 tablets (20 mg total) by mouth daily. 11/28/14   Garvin Fila, MD  LORazepam (ATIVAN) 0.5 MG tablet Take 0.5 mg by mouth as needed for anxiety.    Historical Provider, MD  metoprolol tartrate 75 MG  TABS Take 75 mg by mouth 2 (two) times daily. 05/11/15   Nishant Dhungel, MD  Multiple Vitamins-Minerals (CENTRUM SILVER ADULT 50+ PO) Take 1 tablet by mouth every morning.    Historical Provider, MD  nitrofurantoin (MACRODANTIN) 100 MG capsule Take 100 mg by mouth.    Historical Provider, MD  NOVOLIN 70/30 RELION (70-30) 100 UNIT/ML injection INJECT 50 UNITS SUBCUTANEOUSLY ONCE DAILY WITH BREAKFAST 01/29/15   Renato Shin, MD  phenytoin (DILANTIN) 100 MG ER capsule Take 3 capsules (300 mg total) by mouth daily. 11/26/14   Colon Branch, MD  saccharomyces boulardii (FLORASTOR) 250 MG capsule Take 1 capsule (250 mg total) by mouth 2 (two) times daily. 05/11/15   Nishant Dhungel, MD  warfarin (COUMADIN) 2.5 MG tablet Take 1.25 mg by mouth daily.    Historical Provider, MD    Physical Exam: Filed Vitals:   05/16/15 2100 05/16/15 2200 05/16/15 2333 05/17/15 0003  BP: 136/72 152/98 126/81   Pulse: 128 101 51   Temp:   97.9 F (36.6 C)   TempSrc:   Oral   Resp: 15 17 22    Height:    5\' 9"  (1.753 m)  Weight:    225 lb 12.8 oz (102.422 kg)  SpO2: 98% 96% 96%      General:  Moderately built and nourished.  Eyes: Anicteric no pallor.  ENT: No discharge from the ears eyes nose and mouth.  Neck: No mass felt.  Cardiovascular: S1-S2 heard.  Respiratory: No rhonchi or crepitations.  Abdomen: Soft nontender bowel sounds present.  Skin: No rash.  Musculoskeletal: No edema.  Psychiatric: Appears normal.  Neurologic: Alert awake oriented to time place and person. Moves all extremities.  Labs on Admission:  Basic Metabolic Panel:  Recent Labs Lab 05/10/15 0433 05/11/15 0535 05/16/15 1925  NA 138 140 136  K 3.7 3.4* 3.2*  CL 104 103 96*  CO2 23 23 27   GLUCOSE 154* 152* 166*  BUN 24* 18 11  CREATININE 1.52* 1.28* 0.96  CALCIUM 8.3* 8.7* 8.2*  PHOS  --  2.3*  --    Liver Function Tests:  Recent Labs Lab 05/11/15 0535 05/16/15 1925  AST  --  163*  ALT  --  22  ALKPHOS  --   101  BILITOT  --  0.5  PROT  --  7.1  ALBUMIN 2.6* 2.9*   No results for input(s): LIPASE, AMYLASE in the last 168 hours. No results for input(s): AMMONIA in the last 168 hours. CBC:  Recent Labs Lab 05/16/15 1925  WBC 15.5*  NEUTROABS 12.1*  HGB 11.0*  HCT 34.5*  MCV 93.2  PLT 458*   Cardiac Enzymes:  Recent Labs Lab 05/16/15 1930  TROPONINI 44.57*    BNP (last 3 results) No results for input(s): BNP in the last 8760 hours.  ProBNP (last 3 results) No results for input(s): PROBNP in the last 8760  hours.  CBG:  Recent Labs Lab 05/10/15 1821 05/10/15 2030 05/11/15 0613 05/11/15 1109 05/16/15 2344  GLUCAP 152* 168* 148* 197* 156*    Radiological Exams on Admission: Ct Abdomen Pelvis Wo Contrast  05/16/2015  CLINICAL DATA:  Belching and constipation. EXAM: CT ABDOMEN AND PELVIS WITHOUT CONTRAST TECHNIQUE: Multidetector CT imaging of the abdomen and pelvis was performed following the standard protocol without IV contrast. COMPARISON:  05/06/2015 FINDINGS: Lower chest and abdominal wall: There is new septal thickening and pleural fluid at the bases. Hepatobiliary: No focal liver abnormality.Cholelithiasis without signs of cholecystitis. Calcification near the pancreatic head is likely in the GDA or pancreas. No evidence of calcified choledocholithiasis. Pancreas: Atrophy with 15 mm pancreatic tail lesion. This appeared cystic on recent enhanced imaging. Spleen: Unremarkable. Adrenals/Urinary Tract:  Negative adrenals. Persistent asymmetric right perinephric edema without hydronephrosis. There are bilateral renal cysts and patchy right renal cortical scarring. No hydronephrosis or ureteral calculus. Persistent pericystic haziness and mural gas at the apex. Reproductive:Negative Stomach/Bowel:  No obstruction. No inflammation. Vascular/Lymphatic: No acute vascular abnormality. No mass or adenopathy. Peritoneal: No ascites or pneumoperitoneum. Musculoskeletal: Degenerative  changes and levoscoliosis. Remote intertrochanteric right femur fracture with repair and healing. IMPRESSION: 1. Emphysematous cystitis. Persistent asymmetric right perinephric edema which could reflect pyelonephritis. 2. CHF. 3. Cholelithiasis. 4. 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 Reference CT report 03/06/2015 for follow-up recommendations. Electronically Signed   By: Monte Fantasia M.D.   On: 05/16/2015 21:59   Dg Chest Port 1 View  05/16/2015  CLINICAL DATA:  Weakness and chest pain all day EXAM: PORTABLE CHEST 1 VIEW COMPARISON:  May 07, 2015 FINDINGS: The heart size and mediastinal contours are stable. The heart size is enlarged. There is pulmonary edema. There is no focal pneumonia or pleural effusion. There is mild atelectasis of both lung bases. The visualized skeletal structures are unremarkable. IMPRESSION: Congestive heart failure. Electronically Signed   By: Abelardo Diesel M.D.   On: 05/16/2015 21:02    EKG: Independently reviewed. A. fib with nonspecific ST-T changes.  Assessment/Plan Principal Problem:   Non-ST elevation MI (NSTEMI) (HCC) Active Problems:   Seizure disorder (HCC)   A-fib (HCC)   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[place])   Type 2 diabetes mellitus with vascular disease ([place])   Non-ST elevated myocardial infarction ([place])   1. Non-ST elevation MI - discussed with Dr. Philbert Riser on-call cardiologist. At this time patient will be placed on aspirin IV nitroglycerin, heparin infusion continue with beta blockers and Cardizem. Patient will be kept nothing by mouth in anticipation of cardiac procedure. 2. Systolic heart failure last year measured last week was 35-40% - closely monitor respiratory status. 3. Chronic A. Fib - presently rate controlled. Continue Cardizem and metoprolol and patient is on heparin infusion. Chads 2 vasc score is more than 2. 4. 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 5. Diabetes mellitus type 2 - I have placed patient on Lantus insulin 15 units  in a.m. Closely follow CBG with sliding scale coverage. Once patient did take orally changed back to home dose of NovoLog 70/30. 6. Recent admission for pyelonephritis with Escherichia coli bacteremia - continue antibiotics.   DVT Prophylaxis heparin infusion.  Code Status: Full code.  Family Communication: Discussed with patient's husband.  Disposition Plan: Admit to inpatient.    KAKRAKANDY,ARSHAD N. [facility] Pager [phone].  If 7PM-7AM, please contact night-coverage [email or web address] Password [facility] 05/17/2015, 12:54 AM

## 2015-05-17 NOTE — Progress Notes (Signed)
Pt c/o feeling SOB while resting in bed. 02 sat was at 98% RA, Lungs diminished in the basis. Nasal canula applied for comfort. Pt also stated she felt nauseated. Np on call made aware. New order received for IV lasix and PO potassium. Prn Zofran given.  Will cont to monitor pt.

## 2015-05-17 NOTE — Consult Note (Signed)
Referring Physician: Dr. Ralene Bathe Primary Physician: Dr. Larose Kells Primary Cardiologist: Reason for Consultation: elevated troponin   HPI: [name] [name] is a 75 yo with HTN, HLD, DM, prior tobacco use and AF who was recently discharged following an admission for pyelonephritis who presented to [facility] from PCP office with multiple complaints and found to have an elevated troponin of 44.  Per EDP, her main complaint was constipation stating she had not used the bathroom in 3-4 days.  On my interview, she tells me about her recent admission and then goes on to tell me how she was doing some better at home but had 2 falls that she attributes to balance issues.  She also goes on to tell me about diarrhea that she had early in the week but has not had a bowel movement since then despite eating heavy meals.  I asked her about activities and she reports they have stairs at home which she typically climbs without symptoms but has been more cautious recently attempting to avoid a fall.  When asked about chest symptoms, she stated she developed some chest discomfort after lunch.  Located epigastric area.  She described it as a heavy feeling.  Initially thought it was her bra but didn't improve despite removing it.  This has persisted since and is mild-mod intensity.  Reports this is worse when laying back and better when upright.    Review of Systems:     Cardiac Review of Systems: {Y] = yes [ ]  = no  Chest Pain [ x   ]  Resting SOB [   ] Exertional SOB  [  ]  Orthopnea [  ]   Pedal Edema [   ]    Palpitations [  ] Syncope  [  ]   Presyncope [   ]  General Review of Systems: [Y] = yes [  ]=no Constitional: recent weight change [  ]; anorexia [  ]; fatigue [  ]; nausea [  ]; night sweats [  ]; fever [  ]; or chills [  ];                                                                      Eyes : blurred vision [  ]; diplopia [   ]; vision changes [  ];  Amaurosis fugax[  ]; Resp: cough [  ];  wheezing[  ];  hemoptysis[   ];  PND [  ];  GI:  gallstones[  ], vomiting[  ];  dysphagia[  ]; melena[  ];  hematochezia [  ]; heartburn[  ];   GU: kidney stones [  ]; hematuria[  ];   dysuria [  ];  nocturia[  ]; incontinence [  ];             Skin: rash, swelling[  ];, hair loss[  ];  peripheral edema[  ];  or itching[  ]; Musculosketetal: myalgias[  ];  joint swelling[  ];  joint erythema[  ];  joint pain[  ];  back pain[  ];  Heme/Lymph: bruising[  ];  bleeding[  ];  anemia[  ];  Neuro: TIA[  ];  headaches[  ];  stroke[  ];  vertigo[  ];  seizures[  ];   paresthesias[  ];  difficulty walking[  ];  Psych:depression[  ]; anxiety[  ];  Endocrine: diabetes[  ];  thyroid dysfunction[  ];  Other:  Past Medical History  Diagnosis Date  . Blindness of left eye   . Eye muscle weakness     Right eye weakness after cataract surgery  . Common migraine     ?hx of  . DIABETES MELLITUS, TYPE II 03/27/2006    dr [name]  . HYPERLIPIDEMIA 03/27/2006  . 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 HYPERTENSION 03/27/2006  . Atrial fibrillation ([place])     SVT dx 2007, cath 2007 mild CAD, then had an cardioversion, ablation; still on coumadin , has occ palpitation, EKG 03-2010 NSR  . Headache(784.0) 05/07/2009  . LUNG NODULE 09/01/2006    Excision, Bx Benighn  . Osteopenia 2004    Dexa 2004 showed Osteopenia, DEXA 03/2007 normal  . Seizures ([place])   . Intertrochanteric fracture of right hip ([place]) 07/13/2012  . 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 GERD (gastroesophageal reflux disease) 10/05/2011  . Recurrent urinary tract infection     Seeing Urology  . 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[place]) [id number]    Medications Prior to Admission  Medication Sig Dispense Refill  . atorvastatin (LIPITOR) 40 MG tablet Take 1 tablet (40 mg total) by mouth at bedtime. 30 tablet 8  . betamethasone dipropionate (DIPROLENE) 0.05 % cream Apply topically as needed.    . calcium-vitamin D (OSCAL WITH D) 500-200 MG-UNIT per tablet Take 1 tablet by mouth daily.    . cephALEXin  (KEFLEX) 500 MG capsule Take 1 capsule (500 mg total) by mouth 4 (four) times daily. 36 capsule 0  . ciprofloxacin (CIPRO) 500 MG tablet Take 500 mg by mouth.    . diltiazem (CARDIZEM CD) 360 MG 24 hr capsule Take 1 capsule (360 mg total) by mouth daily. (Patient taking differently: Take 240 mg by mouth daily. ) 30 capsule 0  . escitalopram (LEXAPRO) 10 MG tablet Take 2 tablets (20 mg total) by mouth daily. 90 tablet 2  . LORazepam (ATIVAN) 0.5 MG tablet Take 0.5 mg by mouth as needed for anxiety.    . metoprolol tartrate 75 MG TABS Take 75 mg by mouth 2 (two) times daily. 60 tablet 0  . Multiple Vitamins-Minerals (CENTRUM SILVER ADULT 50+ PO) Take 1 tablet by mouth every morning.    . nitrofurantoin (MACRODANTIN) 100 MG capsule Take 100 mg by mouth.    [name] NOVOLIN 70/30 RELION (70-30) 100 UNIT/ML injection INJECT 50 UNITS SUBCUTANEOUSLY ONCE DAILY WITH BREAKFAST 20 mL 2  . phenytoin (DILANTIN) 100 MG ER capsule Take 3 capsules (300 mg total) by mouth daily. 90 capsule 5  . saccharomyces boulardii (FLORASTOR) 250 MG capsule Take 1 capsule (250 mg total) by mouth 2 (two) times daily. 30 capsule 0  . warfarin (COUMADIN) 2.5 MG tablet Take 1.25 mg by mouth daily.          Infusions: . heparin 1,300 Units/hr (05/16/15 2202)    Allergies  Allergen Reactions  . Procaine Hcl Anaphylaxis    Social History   Social History  . Marital Status: Married    Spouse Name: [name] [name]  . Number of Children: 1  . Years of Education: College   Occupational History  . retired     Social History Main Topics  . Smoking status: Former Smoker    Types: Cigarettes    Quit date: 03/02/1978  . Smokeless tobacco: Never Used  . Alcohol  Use: No  . Drug Use: No  . Sexual Activity: No   Other Topics Concern  . Not on file   Social History Narrative   Patient lives at home spouse.   Moved from [place] 2004   1 son, problems w/ drugs, at home         Family History  Problem Relation Age of Onset  .  Diabetes Father   . Heart attack Father 73  . Diabetes Sister   . Cancer Neg Hx     no hx of colon or breast cancer    PHYSICAL EXAM: Filed Vitals:   05/16/15 2200 05/16/15 2333  BP: 152/98 126/81  Pulse: 101 51  Temp:  97.9 F (36.6 C)  Resp: 17 22    No intake or output data in the 24 hours ending 05/17/15 0017  General:  Elderly, NAD, No respiratory difficulty HEENT: normal Neck: supple. no JVD. Carotids 2+ bilat; no bruits. No lymphadenopathy or thryomegaly appreciated. Cor: PMI nondisplaced. irregular rate & rhythm. No rubs, gallops or murmurs. Lungs: clear Abdomen: soft, nontender, nondistended. No hepatosplenomegaly. No bruits or masses. Good bowel sounds. Extremities: no cyanosis, clubbing, rash, edema Neuro: alert & oriented x 3, cranial nerves grossly intact. moves all 4 extremities w/o difficulty. Affect pleasant.  ECG: AFL with variable AV conduction, occasional PVC, poor R wave progression, non-specific ST-T changes.  Results for orders placed or performed during the hospital encounter of 05/16/15 (from the past 24 hour(s))  Comprehensive metabolic panel     Status: Abnormal   Collection Time: 05/16/15  7:25 PM  Result Value Ref Range   Sodium 136 135 - 145 mmol/L   Potassium 3.2 (L) 3.5 - 5.1 mmol/L   Chloride 96 (L) 101 - 111 mmol/L   CO2 27 22 - 32 mmol/L   Glucose, Bld 166 (H) 65 - 99 mg/dL   BUN 11 6 - 20 mg/dL   Creatinine, Ser 0.96 0.44 - 1.00 mg/dL   Calcium 8.2 (L) 8.9 - 10.3 mg/dL   Total Protein 7.1 6.5 - 8.1 g/dL   Albumin 2.9 (L) 3.5 - 5.0 g/dL   AST 163 (H) 15 - 41 U/L   ALT 22 14 - 54 U/L   Alkaline Phosphatase 101 38 - 126 U/L   Total Bilirubin 0.5 0.3 - 1.2 mg/dL   GFR calc non Af Amer 57 (L) >60 mL/min   GFR calc Af Amer >60 >60 mL/min   Anion gap 13 5 - 15  CBC with Differential     Status: Abnormal   Collection Time: 05/16/15  7:25 PM  Result Value Ref Range   WBC 15.5 (H) 4.0 - 10.5 K/uL   RBC 3.70 (L) 3.87 - 5.11 MIL/uL    Hemoglobin 11.0 (L) 12.0 - 15.0 g/dL   HCT 34.5 (L) 36.0 - 46.0 %   MCV 93.2 78.0 - 100.0 fL   MCH 29.7 26.0 - 34.0 pg   MCHC 31.9 30.0 - 36.0 g/dL   RDW 14.0 11.5 - 15.5 %   Platelets 458 (H) 150 - 400 K/uL   Neutrophils Relative % 78 %   Lymphocytes Relative 15 %   Monocytes Relative 5 %   Eosinophils Relative 2 %   Basophils Relative 0 %   Neutro Abs 12.1 (H) 1.7 - 7.7 K/uL   Lymphs Abs 2.3 0.7 - 4.0 K/uL   Monocytes Absolute 0.8 0.1 - 1.0 K/uL   Eosinophils Absolute 0.3 0.0 - 0.7 K/uL   Basophils  Absolute 0.0 0.0 - 0.1 K/uL   WBC Morphology ATYPICAL LYMPHOCYTES    Smear Review LARGE PLATELETS PRESENT   Protime-INR     Status: Abnormal   Collection Time: 05/16/15  7:25 PM  Result Value Ref Range   Prothrombin Time 18.5 (H) 11.6 - 15.2 seconds   INR 1.54 (H) 0.00 - 1.49  Troponin I     Status: Abnormal   Collection Time: 05/16/15  7:30 PM  Result Value Ref Range   Troponin I 44.57 (HH) <0.031 ng/mL  Urinalysis, Routine w reflex microscopic     Status: Abnormal   Collection Time: 05/16/15  9:10 PM  Result Value Ref Range   Color, Urine YELLOW YELLOW   APPearance CLEAR CLEAR   Specific Gravity, Urine 1.008 1.005 - 1.030   pH 7.5 5.0 - 8.0   Glucose, UA NEGATIVE NEGATIVE mg/dL   Hgb urine dipstick NEGATIVE NEGATIVE   Bilirubin Urine NEGATIVE NEGATIVE   Ketones, ur NEGATIVE NEGATIVE mg/dL   Protein, ur NEGATIVE NEGATIVE mg/dL   Nitrite NEGATIVE NEGATIVE   Leukocytes, UA SMALL (A) NEGATIVE  Urine microscopic-add on     Status: Abnormal   Collection Time: 05/16/15  9:10 PM  Result Value Ref Range   Squamous Epithelial / LPF 0-5 (A) NONE SEEN   WBC, UA 6-30 0 - 5 WBC/hpf   RBC / HPF 0-5 0 - 5 RBC/hpf   Bacteria, UA MANY (A) NONE SEEN  Glucose, capillary     Status: Abnormal   Collection Time: 05/16/15 11:44 PM  Result Value Ref Range   Glucose-Capillary 156 (H) 65 - 99 mg/dL   Ct Abdomen Pelvis Wo Contrast  05/16/2015  CLINICAL DATA:  Belching and constipation. EXAM:  CT ABDOMEN AND PELVIS WITHOUT CONTRAST TECHNIQUE: Multidetector CT imaging of the abdomen and pelvis was performed following the standard protocol without IV contrast. COMPARISON:  05/06/2015 FINDINGS: Lower chest and abdominal wall: There is new septal thickening and pleural fluid at the bases. Hepatobiliary: No focal liver abnormality.Cholelithiasis without signs of cholecystitis. Calcification near the pancreatic head is likely in the GDA or pancreas. No evidence of calcified choledocholithiasis. Pancreas: Atrophy with 15 mm pancreatic tail lesion. This appeared cystic on recent enhanced imaging. Spleen: Unremarkable. Adrenals/Urinary Tract:  Negative adrenals. Persistent asymmetric right perinephric edema without hydronephrosis. There are bilateral renal cysts and patchy right renal cortical scarring. No hydronephrosis or ureteral calculus. Persistent pericystic haziness and mural gas at the apex. Reproductive:Negative Stomach/Bowel:  No obstruction. No inflammation. Vascular/Lymphatic: No acute vascular abnormality. No mass or adenopathy. Peritoneal: No ascites or pneumoperitoneum. Musculoskeletal: Degenerative changes and levoscoliosis. Remote intertrochanteric right femur fracture with repair and healing. IMPRESSION: 1. Emphysematous cystitis. Persistent asymmetric right perinephric edema which could reflect pyelonephritis. 2. CHF. 3. Cholelithiasis. 4. 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 Reference CT report 03/06/2015 for follow-up recommendations. Electronically Signed   By: Monte Fantasia M.D.   On: 05/16/2015 21:59   Dg Chest Port 1 View  05/16/2015  CLINICAL DATA:  Weakness and chest pain all day EXAM: PORTABLE CHEST 1 VIEW COMPARISON:  May 07, 2015 FINDINGS: The heart size and mediastinal contours are stable. The heart size is enlarged. There is pulmonary edema. There is no focal pneumonia or pleural effusion. There is mild atelectasis of both lung bases. The visualized skeletal structures are unremarkable.  IMPRESSION: Congestive heart failure. Electronically Signed   By: Abelardo Diesel M.D.   On: 05/16/2015 21:02     ASSESSMENT: [name] Toyne is a 75 yo with HTN, HLD,  DM, prior tobacco use and AF who was recently discharged following an admission for pyelonephritis who presented to [facility] from PCP office with multiple complaints and found to have an elevated troponin of 44.  Her symptoms of epigastric heaviness, ECG changes and troponin elevation are consistent with ACS.  Although her ECG has some subtle ST elevations, they do not meet criteria and do not have obvious reciprocal changes thus it likely represents NSTEMI.    PLAN/DISCUSSION: ASA Hold coumadin, continue heparin drip Initiate metoprolol (can give 5 mg IV vs 25 mg PO) given current HR Consider low dose NTG drip, can uptitrate as BP permits Continue statin Repeat ECG: done at bedside, unchanged from those earlier today Cycle troponin: fist to be drawn now NPO for likely cath tomorrow Will likely need echo for repeat assessment of LVF given NSTEMI

## 2015-05-17 NOTE — Progress Notes (Signed)
UR Completed Andren Bethea Graves-Bigelow, RN,BSN [phone]  

## 2015-05-18 ENCOUNTER — Encounter (HOSPITAL_COMMUNITY): Payer: Self-pay | Admitting: Interventional Cardiology

## 2015-05-18 LAB — PROTIME-INR
INR: 1.87 — ABNORMAL HIGH (ref 0.00–1.49)
PROTHROMBIN TIME: 21.5 s — AB (ref 11.6–15.2)

## 2015-05-18 LAB — BASIC METABOLIC PANEL
Anion gap: 12 (ref 5–15)
BUN: 10 mg/dL (ref 6–20)
CALCIUM: 8.4 mg/dL — AB (ref 8.9–10.3)
CHLORIDE: 95 mmol/L — AB (ref 101–111)
CO2: 30 mmol/L (ref 22–32)
CREATININE: 1.26 mg/dL — AB (ref 0.44–1.00)
GFR calc Af Amer: 48 mL/min — ABNORMAL LOW (ref >60)
GFR calc non Af Amer: 41 mL/min — ABNORMAL LOW (ref >60)
GLUCOSE: 237 mg/dL — AB (ref 65–99)
Potassium: 4 mmol/L (ref 3.5–5.1)
Sodium: 137 mmol/L (ref 135–145)

## 2015-05-18 LAB — GLUCOSE, CAPILLARY
Glucose-Capillary: 236 mg/dL — ABNORMAL HIGH (ref 65–99)
Glucose-Capillary: 330 mg/dL — ABNORMAL HIGH (ref 65–99)
Glucose-Capillary: 261 mg/dL — ABNORMAL HIGH (ref 65–99)
Glucose-Capillary: 252 mg/dL — ABNORMAL HIGH (ref 65–99)

## 2015-05-18 MED ORDER — WARFARIN SODIUM 2.5 MG PO TABS
1.2500 mg | ORAL_TABLET | Freq: Every day | ORAL | Status: DC
  Administered 2015-05-18 – 2015-05-20 (×3): 1.25 mg via ORAL
  Filled 2015-05-18 (×6): qty 0.5

## 2015-05-18 MED ORDER — METOPROLOL TARTRATE 50 MG PO TABS
50.0000 mg | ORAL_TABLET | Freq: Two times a day (BID) | ORAL | Status: DC
  Administered 2015-05-18: 50 mg via ORAL
  Filled 2015-05-18: qty 1

## 2015-05-18 MED ORDER — DILTIAZEM HCL ER COATED BEADS 180 MG PO CP24: 180.0000 mg | ORAL_CAPSULE | Freq: Every day | ORAL | Status: DC

## 2015-05-18 MED ORDER — LISINOPRIL 2.5 MG PO TABS
2.5000 mg | ORAL_TABLET | Freq: Every day | ORAL | Status: DC
  Administered 2015-05-18 – 2015-05-21 (×4): 2.5 mg via ORAL
  Filled 2015-05-18 (×4): qty 1

## 2015-05-18 MED ORDER — FUROSEMIDE 40 MG PO TABS
40.0000 mg | ORAL_TABLET | Freq: Every day | ORAL | Status: DC
  Administered 2015-05-19 – 2015-05-20 (×2): 40 mg via ORAL
  Filled 2015-05-18 (×2): qty 1

## 2015-05-18 NOTE — Progress Notes (Signed)
[facility]                                                                              Patient Demographics  [name] [name], is a 75 y.o. female, DOB - 1940/03/04, [id number]  Admit date - 05/16/2015   Admitting Physician Ivor Costa, MD  Outpatient Primary MD for the patient is [name], MD  LOS - 2  days    Chief Complaint  Patient presents with  . Weakness       Brief HPI   [name] [name] is a 75 y.o. 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 Patient started developing chest pain around afternoon yesterday which was pressure-like and enveloping the whole chest with diaphoresis. Patient with rectal bleeding to go to her PCP and was referred to the ER. In the ER EKG shows A. fib with nonspecific ST changes. Troponin was markedly elevated around 44. Patient INR was subtherapeutic patient being on Coumadin. On call cardiologist Dr. Philbert Riser was consulted and patient had been admitted for non-ST elevation MI. Patient's chest pain has improved at this time.   Assessment & Plan    Principal Problem:   Non-ST elevation MI (NSTEMI) (HCC) - Troponin elevated 47, chest pain currently dissolved on nitro drip, heparin drip - Patient underwent cardiac cath, moderate LV systolic dysfunction, [id number] stenosed first marginal, balloon angioplasty done, no stent placed.  Active Problems:   Seizure disorder (HCC) - Continue Dilantin    A-fib (HCC) - Currently rate controlled, continue Cardizem, metoprolol - CHADS vasc  4, Coumadin restarted - Per cardiology, increase metoprolol to 50 mg BID, decrease Cardizem to 180 mg daily    Acute on chronic 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[place]) - 2-D echo 3/10 showed EF of 35-40% with diffuse hypokinesis, - Placed on diuresis by cardiology    Type 2 diabetes mellitus with  vascular disease ([place]) uncontrolled - Placed on sliding scale insulin - A1c pending  Code Status: Full code   Family Communication: Discussed in detail with the patient, all imaging results, lab results explained to the patient at the bedside   Disposition Plan: Transfer to telemetry  Time Spent in minutes   25* minutes  Procedures  Cardiac cath  Consults   Cardiology  DVT Prophylaxis Coumadin Medications  Scheduled Meds: . aspirin  81 mg Oral Daily  . atorvastatin  40 mg Oral QHS  . bisacodyl  10 mg Rectal Once  . calcium-vitamin D  1 tablet Oral Q breakfast  . cephALEXin  500 mg Oral TID AC & HS  . clopidogrel  75 mg Oral Daily  . [START ON 05/19/2015] diltiazem  180 mg Oral Daily  . escitalopram  20 mg Oral Daily  . [START ON 05/19/2015] furosemide  40 mg Oral Daily  . insulin aspart  0-5 Units Subcutaneous QHS  . insulin aspart  0-9 Units Subcutaneous TID WC  . insulin glargine  15 Units Subcutaneous Daily  . lisinopril  2.5 mg Oral Daily  . metoprolol tartrate  50 mg Oral BID  . phenytoin  300 mg Oral Daily  . polyethylene glycol  17 g Oral Daily  . sodium chloride flush  3 mL Intravenous Q12H  . warfarin  1.25 mg Oral q1800  . Warfarin - Pharmacist Dosing Inpatient   Does not apply q1800   Continuous Infusions:   PRN Meds:.sodium chloride, acetaminophen **OR** acetaminophen, acetaminophen, LORazepam, morphine injection, ondansetron **OR** ondansetron (ZOFRAN) IV, sodium chloride flush   Antibiotics   Anti-infectives    Start     Dose/Rate Route Frequency Ordered Stop   05/17/15 0130  cephALEXin (KEFLEX) capsule 500 mg     500 mg Oral 3 times daily before meals & bedtime 05/17/15 0053 05/20/15 0629   05/16/15 2145  cefTRIAXone (ROCEPHIN) 1 g in dextrose 5 % 50 mL IVPB     1 g 100 mL/hr over 30 Minutes Intravenous  Once 05/16/15 2139 05/16/15 2301        Subjective:   [name] [name] was seen and examined today. Patient seen and examined, status post  cardiac cath yesterday. He denies any specific complaints. No chest pain. No fevers or chills.  Patient denies dizziness, abdominal pain, N/V/D/C, new weakness, numbess, tingling. No acute events overnight.    Objective:   Filed Vitals:   05/18/15 0415 05/18/15 0700 05/18/15 0800 05/18/15 1229  BP: 149/62 138/79 143/52 114/92  Pulse: 74 100 91 80  Temp: 97.5 F (36.4 C) 98 F (36.7 C)  97.8 F (36.6 C)  TempSrc: Oral Oral  Oral  Resp: 19 29 16 18   Height:      Weight: 103.4 kg (227 lb 15.3 oz)     SpO2: 99% 97% 99% 100%    Intake/Output Summary (Last 24 hours) at 05/18/15 1249 Last data filed at 05/18/15 0931  Gross per 24 hour  Intake 1171.68 ml  Output    900 ml  Net 271.68 ml     Wt Readings from Last 3 Encounters:  05/18/15 103.4 kg (227 lb 15.3 oz)  05/16/15 103.329 kg (227 lb 12.8 oz)  05/11/15 104.373 kg (230 lb 1.6 oz)     Exam  General: Alert and oriented x 3, NAD  HEENT:  PERRLA, EOMI  Neck: Supple, no JVD  CVS: S1 S2 auscultated,irregularly irregular  Respiratory: Clear to auscultation bilaterally, no wheezing, rales or rhonchi  Abdomen: Soft, nontender, nondistended, + bowel sounds  Ext: no cyanosis clubbing, 2+ edema  Neuro: AAOx3, Cr N's II- XII. Strength 5/5 upper and lower extremities bilaterally  Skin: No rashes  Psych: Normal affect and demeanor, alert and oriented x3    Data Reviewed:  I have personally reviewed following labs and imaging studies  Micro Results No results found for this or any previous visit (from the past 240 hour(s)).  Radiology Reports Ct Abdomen Pelvis Wo Contrast  05/16/2015  CLINICAL DATA:  Belching and constipation. EXAM: CT ABDOMEN AND PELVIS WITHOUT CONTRAST TECHNIQUE: Multidetector CT imaging of the abdomen and pelvis was performed following the standard protocol without IV contrast. COMPARISON:  05/06/2015 FINDINGS: Lower chest and abdominal wall: There is new septal thickening and pleural fluid at the  bases. Hepatobiliary: No focal liver abnormality.Cholelithiasis without signs of cholecystitis. Calcification near the pancreatic head is likely in the GDA or pancreas. No evidence of calcified choledocholithiasis. Pancreas: Atrophy with 15 mm pancreatic tail lesion. This appeared cystic on recent enhanced imaging. Spleen: Unremarkable. Adrenals/Urinary Tract:  Negative adrenals. Persistent asymmetric right perinephric edema without hydronephrosis. There are bilateral renal cysts  and patchy right renal cortical scarring. No hydronephrosis or ureteral calculus. Persistent pericystic haziness and mural gas at the apex. Reproductive:Negative Stomach/Bowel:  No obstruction. No inflammation. Vascular/Lymphatic: No acute vascular abnormality. No mass or adenopathy. Peritoneal: No ascites or pneumoperitoneum. Musculoskeletal: Degenerative changes and levoscoliosis. Remote intertrochanteric right femur fracture with repair and healing. IMPRESSION: 1. Emphysematous cystitis. Persistent asymmetric right perinephric edema which could reflect pyelonephritis. 2. CHF. 3. Cholelithiasis. 4. 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 Reference CT report 03/06/2015 for follow-up recommendations. Electronically Signed   By: Monte Fantasia M.D.   On: 05/16/2015 21:59   Ct Abdomen Pelvis W Contrast  05/06/2015  CLINICAL DATA:  Nausea, vomiting and diarrhea. Patient fell this morning with decreased blood pressure. History of diabetes and hypertension. EXAM: CT ABDOMEN AND PELVIS WITH CONTRAST TECHNIQUE: Multidetector CT imaging of the abdomen and pelvis was performed using the standard protocol following bolus administration of intravenous contrast. CONTRAST:  35mL OMNIPAQUE IOHEXOL 300 MG/ML  SOLN COMPARISON:  CT 03/06/2015. FINDINGS: Lower chest: Stable atelectasis or scarring in both lung bases. Cardiomegaly and coronary artery calcifications are noted. There is no pleural or pericardial effusion. Hepatobiliary: The liver has a stable appearance  without focal abnormality. Multiple small calcified gallstones are again noted. There is no gallbladder wall thickening or significant biliary dilatation. Pancreas: The pancreas appears unchanged with diffuse atrophy and multiple small cystic lesions, measuring up to 1.7 cm in the pancreatic tail on image number 27. No surrounding inflammatory change or pancreatic ductal dilatation. Spleen: Normal in size without focal abnormality. Adrenals/Urinary Tract: The adrenal glands appear stable without suspicious findings. No renal cortical scarring and atrophy are again noted, right-greater-than-left. There are several low-density renal lesions which are grossly stable. There is new mild dilatation of the right renal pelvis with associated wall thickening and mildly delayed contrast excretion. There is also new asymmetric perinephric soft tissue stranding on the right. No ureteral dilatation or urinary tract calculus is seen. There is new air within the urinary bladder lumen. Some of this air may be contained within the bladder wall. No air is seen within the kidneys or ureters. There are no inflammatory changes surrounding the bladder. Stomach/Bowel: No evidence of bowel wall thickening, distention or surrounding inflammatory change. The appendix appears normal. There is mild distal colonic diverticulosis. Vascular/Lymphatic: There are no enlarged abdominal or pelvic lymph nodes. There is diffuse atherosclerosis of the aorta, its branches and the iliac arteries. Reproductive: Unremarkable.  No evidence of adnexal mass. Other: No generalized ascites or free air. The anterior abdominal wall appears unremarkable. Musculoskeletal: No acute or significant osseous findings. There are stable degenerative changes throughout the spine associated with a mild convex left scoliosis. Previous right proximal femoral ORIF. Chronic T11 compression deformity appears unchanged. IMPRESSION: 1. Interval change in the appearance of the right  kidney with moderate perinephric soft tissue stranding, pelvic wall thickening and delayed contrast excretion. No evidence of obstructing calculus. These findings could be secondary to a recently passed calculus, obstruction from previously suspected papillary necrosis or pyelonephritis. 2. Air within the urinary bladder lumen and possibly the right bladder wall. Emphysematous cystitis should be considered in this diabetic if bladder instrumentation has not been recently performed. Correlation with urine analysis recommended. 3. Cholelithiasis without evidence of cholecystitis or biliary dilatation. 4. 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 Please refer to follow up recommendations issued 2 months ago. Electronically Signed   By: Richardean Sale M.D.   On: 05/06/2015 16:57   Dg Chest [facility]  05/16/2015  CLINICAL DATA:  Weakness and chest pain all day EXAM: PORTABLE CHEST 1 VIEW COMPARISON:  May 07, 2015 FINDINGS: The heart size and mediastinal contours are stable. The heart size is enlarged. There is pulmonary edema. There is no focal pneumonia or pleural effusion. There is mild atelectasis of both lung bases. The visualized skeletal structures are unremarkable. IMPRESSION: Congestive heart failure. Electronically Signed   By: Abelardo Diesel M.D.   On: 05/16/2015 21:02   Dg Chest Port 1 View  05/07/2015  CLINICAL DATA:  Sepsis, UTI, abdominal pain EXAM: PORTABLE CHEST 1 VIEW COMPARISON:  08/23/2014 and 11/21/2012 FINDINGS: Cardiomegaly again noted. Chronic elevation of the right hemidiaphragm. Central mild vascular congestion and mild interstitial prominence bilateral without convincing pulmonary edema. Stable old fracture of the right sixth rib. No segmental infiltrate. Mild basilar atelectasis. Suboptimal study due to patient's large body habitus IMPRESSION: Central mild vascular congestion and mild interstitial prominence bilaterally without convincing pulmonary edema.  Suboptimal study due to patient's large body habitus. Chronic elevation of the right hemidiaphragm. No segmental infiltrate. Mild basilar atelectasis. Electronically Signed   By: Lahoma Crocker M.D.   On: 05/07/2015 07:55    CBC  Recent Labs Lab 05/16/15 1925 05/17/15 0708 05/17/15 1448 05/17/15 1832  WBC 15.5* 18.2* 17.7*  --   HGB 11.0* 9.8* 9.5*  --   HCT 34.5* 30.6* 29.7*  --   PLT 458* 466* 426* 440*  MCV 93.2 91.3 91.7  --   MCH 29.7 29.3 29.3  --   MCHC 31.9 32.0 32.0  --   RDW 14.0 14.1 14.2  --   LYMPHSABS 2.3 1.6  --   --   MONOABS 0.8 1.5*  --   --   EOSABS 0.3 0.0  --   --   BASOSABS 0.0 0.0  --   --     Chemistries   Recent Labs Lab 05/16/15 1925 05/17/15 0708 05/17/15 1448 05/18/15 0247  NA 136 137  --  137  K 3.2* 4.3  --  4.0  CL 96* 96*  --  95*  CO2 27 30  --  30  GLUCOSE 166* 198*  --  237*  BUN 11 9  --  10  CREATININE 0.96 1.13* 1.21* 1.26*  CALCIUM 8.2* 8.4*  --  8.4*  AST 163* 142*  --   --   ALT 22 21  --   --   ALKPHOS 101 103  --   --   BILITOT 0.5 0.8  --   --    ------------------------------------------------------------------------------------------------------------------ estimated creatinine clearance is 50.9 mL/min (by C-G formula based on Cr of 1.26). ------------------------------------------------------------------------------------------------------------------ No results for input(s): HGBA1C in the last 72 hours. ------------------------------------------------------------------------------------------------------------------ No results for input(s): CHOL, HDL, LDLCALC, TRIG, CHOLHDL, LDLDIRECT in the last 72 hours. ------------------------------------------------------------------------------------------------------------------ No results for input(s): TSH, T4TOTAL, T3FREE, THYROIDAB in the last 72 hours.  Invalid input(s):  FREET3 ------------------------------------------------------------------------------------------------------------------ No results for input(s): VITAMINB12, FOLATE, FERRITIN, TIBC, IRON, RETICCTPCT in the last 72 hours.  Coagulation profile  Recent Labs Lab 05/16/15 1925 05/18/15 0247  INR 1.54* 1.87*    No results for input(s): DDIMER in the last 72 hours.  Cardiac Enzymes  Recent Labs Lab 05/17/15 0146 05/17/15 0708 05/17/15 1448  TROPONINI 42.66* 47.20* 48.33*   ------------------------------------------------------------------------------------------------------------------ Invalid input(s): POCBNP   Recent Labs  05/17/15 0737 05/17/15 1330 05/17/15 1746 05/17/15 2104 05/18/15 0607 05/18/15 1205  GLUCAP 175* 213* 254* 288* 236* 330*     Augusta Hilbert M.D. [facility] 05/18/2015, 12:49 PM  Pager: [phone] Between 7am to 7pm - call Pager - [phone]  After 7pm go to [email or web address] - password [email or web address]  Call night coverage person covering after 7pm

## 2015-05-18 NOTE — Progress Notes (Signed)
CARDIAC REHAB PHASE I   PRE:  Rate/Rhythm: 77 Afib  BP:  Supine: 140/70  Sitting:   Standing:    SaO2: 93 RA  MODE:  Ambulation: 80 ft   POST:  Rate/Rhythm: 86  BP:  Supine:   Sitting: 124/64  Standing:    SaO2: 95 RA 1500-1530 Assisted X 1 and used walker to ambulate. Gait steady with walker. Pt able to walk 80 feet, tires easily. Only c/o with walking was that she was tired. VS stable Pt to bed after walk with call light in reach. Left pt MI and off the beat booklets.  Rodney Langton RN 05/18/2015 3:30 PM

## 2015-05-18 NOTE — Progress Notes (Signed)
Patient Name: [name] [name] Date of Encounter: 05/18/2015  Primary Cardiologist: Dr. Gwenlyn Found     SUBJECTIVE  No chest pain or dyspnea  CURRENT MEDS . aspirin  81 mg Oral Daily  . atorvastatin  40 mg Oral QHS  . bisacodyl  10 mg Rectal Once  . calcium-vitamin D  1 tablet Oral Q breakfast  . cephALEXin  500 mg Oral TID AC & HS  . clopidogrel  75 mg Oral Daily  . diltiazem  240 mg Oral Daily  . escitalopram  20 mg Oral Daily  . insulin aspart  0-5 Units Subcutaneous QHS  . insulin aspart  0-9 Units Subcutaneous TID WC  . insulin glargine  15 Units Subcutaneous Daily  . metoprolol tartrate  25 mg Oral BID  . phenytoin  300 mg Oral Daily  . polyethylene glycol  17 g Oral Daily  . sodium chloride flush  3 mL Intravenous Q12H  . Warfarin - Pharmacist Dosing Inpatient   Does not apply q1800    OBJECTIVE  Filed Vitals:   05/17/15 1917 05/18/15 0415 05/18/15 0700 05/18/15 0800  BP: 159/56 149/62 138/79 143/52  Pulse: 76 74 100 91  Temp: 97.9 F (36.6 C) 97.5 F (36.4 C) 98 F (36.7 C)   TempSrc: Oral Oral Oral   Resp: 22 19 29 16   Height:      Weight:  227 lb 15.3 oz (103.4 kg)    SpO2: 92% 99% 97% 99%    Intake/Output Summary (Last 24 hours) at 05/18/15 0925 Last data filed at 05/18/15 0900  Gross per 24 hour  Intake 1352.18 ml  Output    500 ml  Net 852.18 ml   Filed Weights   05/16/15 1847 05/17/15 0003 05/18/15 0415  Weight: 227 lb (102.967 kg) 225 lb 12.8 oz (102.422 kg) 227 lb 15.3 oz (103.4 kg)    PHYSICAL EXAM  General: Pleasant, NAD. Neuro: grossly intact Psych: Normal affect. HEENT:  Normal  Neck: Supple Lungs:  CTA. Heart: irregular Abdomen: Soft, non-tender, non-distended, BS + x 4.  Extremities: 1-2+ pitting edema  Accessory Clinical Findings  CBC  Recent Labs  05/16/15 1925 05/17/15 0708 05/17/15 1448 05/17/15 1832  WBC 15.5* 18.2* 17.7*  --   NEUTROABS 12.1* 15.1*  --   --   HGB 11.0* 9.8* 9.5*  --   HCT 34.5* 30.6* 29.7*   --   MCV 93.2 91.3 91.7  --   PLT 458* 466* 426* 123456*   Basic Metabolic Panel  Recent Labs  05/17/15 0708 05/17/15 1448 05/18/15 0247  NA 137  --  137  K 4.3  --  4.0  CL 96*  --  95*  CO2 30  --  30  GLUCOSE 198*  --  237*  BUN 9  --  10  CREATININE 1.13* 1.21* 1.26*  CALCIUM 8.4*  --  8.4*   Liver Function Tests  Recent Labs  05/16/15 1925 05/17/15 0708  AST 163* 142*  ALT 22 21  ALKPHOS 101 103  BILITOT 0.5 0.8  PROT 7.1 6.4*  ALBUMIN 2.9* 2.5*   Cardiac Enzymes  Recent Labs  05/17/15 0146 05/17/15 0708 05/17/15 1448  TROPONINI 42.66* 47.20* 48.33*    TELE afib     Echocardiogram 05/10/2015  LV EF: 35% - 40%  ------------------------------------------------------------------- Indications: Atrial fibrillation - 427.31.  ------------------------------------------------------------------- History: PMH: Elevated Troponin. EKG changes. Risk factors: Hypertension. Diabetes mellitus. Dyslipidemia.  ------------------------------------------------------------------- Study Conclusions  - Left ventricle: The cavity size  was normal. There was moderate  concentric hypertrophy. Systolic function was moderately reduced.  The estimated ejection fraction was in the range of 35% to 40%.  Diffuse hypokinesis. The study was not technically sufficient to  allow evaluation of LV diastolic dysfunction due to atrial  fibrillation. - Aortic valve: Trileaflet; normal thickness leaflets. There was no  regurgitation. - Aortic root: The aortic root was normal in size. - Mitral valve: Mildly thickened leaflets . There was mild  regurgitation. - Left atrium: The atrium was moderately dilated. - Right ventricle: The cavity size was normal. Wall thickness was  normal. Systolic function was normal. - Right atrium: The atrium was normal in size. - Tricuspid valve: There was moderate regurgitation. - Pulmonic valve: There was no regurgitation. -  Pulmonary arteries: Systolic pressure was mildly to moderately  increased. PA peak pressure: 45 mm Hg (S). - Inferior vena cava: The vessel was normal in size. - Pericardium, extracardiac: There was no pericardial effusion.     Radiology/Studies  Ct Abdomen Pelvis Wo Contrast  05/16/2015  CLINICAL DATA:  Belching and constipation. EXAM: CT ABDOMEN AND PELVIS WITHOUT CONTRAST TECHNIQUE: Multidetector CT imaging of the abdomen and pelvis was performed following the standard protocol without IV contrast. COMPARISON:  05/06/2015 FINDINGS: Lower chest and abdominal wall: There is new septal thickening and pleural fluid at the bases. Hepatobiliary: No focal liver abnormality.Cholelithiasis without signs of cholecystitis. Calcification near the pancreatic head is likely in the GDA or pancreas. No evidence of calcified choledocholithiasis. Pancreas: Atrophy with 15 mm pancreatic tail lesion. This appeared cystic on recent enhanced imaging. Spleen: Unremarkable. Adrenals/Urinary Tract:  Negative adrenals. Persistent asymmetric right perinephric edema without hydronephrosis. There are bilateral renal cysts and patchy right renal cortical scarring. No hydronephrosis or ureteral calculus. Persistent pericystic haziness and mural gas at the apex. Reproductive:Negative Stomach/Bowel:  No obstruction. No inflammation. Vascular/Lymphatic: No acute vascular abnormality. No mass or adenopathy. Peritoneal: No ascites or pneumoperitoneum. Musculoskeletal: Degenerative changes and levoscoliosis. Remote intertrochanteric right femur fracture with repair and healing. IMPRESSION: 1. Emphysematous cystitis. Persistent asymmetric right perinephric edema which could reflect pyelonephritis. 2. CHF. 3. Cholelithiasis. 4. 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 Reference CT report 03/06/2015 for follow-up recommendations. Electronically Signed   By: Monte Fantasia M.D.   On: 05/16/2015 21:59   Ct Abdomen Pelvis W Contrast  05/06/2015  CLINICAL  DATA:  Nausea, vomiting and diarrhea. Patient fell this morning with decreased blood pressure. History of diabetes and hypertension. EXAM: CT ABDOMEN AND PELVIS WITH CONTRAST TECHNIQUE: Multidetector CT imaging of the abdomen and pelvis was performed using the standard protocol following bolus administration of intravenous contrast. CONTRAST:  42mL OMNIPAQUE IOHEXOL 300 MG/ML  SOLN COMPARISON:  CT 03/06/2015. FINDINGS: Lower chest: Stable atelectasis or scarring in both lung bases. Cardiomegaly and coronary artery calcifications are noted. There is no pleural or pericardial effusion. Hepatobiliary: The liver has a stable appearance without focal abnormality. Multiple small calcified gallstones are again noted. There is no gallbladder wall thickening or significant biliary dilatation. Pancreas: The pancreas appears unchanged with diffuse atrophy and multiple small cystic lesions, measuring up to 1.7 cm in the pancreatic tail on image number 27. No surrounding inflammatory change or pancreatic ductal dilatation. Spleen: Normal in size without focal abnormality. Adrenals/Urinary Tract: The adrenal glands appear stable without suspicious findings. No renal cortical scarring and atrophy are again noted, right-greater-than-left. There are several low-density renal lesions which are grossly stable. There is new mild dilatation of the right renal pelvis with associated wall thickening and  mildly delayed contrast excretion. There is also new asymmetric perinephric soft tissue stranding on the right. No ureteral dilatation or urinary tract calculus is seen. There is new air within the urinary bladder lumen. Some of this air may be contained within the bladder wall. No air is seen within the kidneys or ureters. There are no inflammatory changes surrounding the bladder. Stomach/Bowel: No evidence of bowel wall thickening, distention or surrounding inflammatory change. The appendix appears normal. There is mild distal colonic  diverticulosis. Vascular/Lymphatic: There are no enlarged abdominal or pelvic lymph nodes. There is diffuse atherosclerosis of the aorta, its branches and the iliac arteries. Reproductive: Unremarkable.  No evidence of adnexal mass. Other: No generalized ascites or free air. The anterior abdominal wall appears unremarkable. Musculoskeletal: No acute or significant osseous findings. There are stable degenerative changes throughout the spine associated with a mild convex left scoliosis. Previous right proximal femoral ORIF. Chronic T11 compression deformity appears unchanged. IMPRESSION: 1. Interval change in the appearance of the right kidney with moderate perinephric soft tissue stranding, pelvic wall thickening and delayed contrast excretion. No evidence of obstructing calculus. These findings could be secondary to a recently passed calculus, obstruction from previously suspected papillary necrosis or pyelonephritis. 2. Air within the urinary bladder lumen and possibly the right bladder wall. Emphysematous cystitis should be considered in this diabetic if bladder instrumentation has not been recently performed. Correlation with urine analysis recommended. 3. Cholelithiasis without evidence of cholecystitis or biliary dilatation. 4. 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 Please refer to follow up recommendations issued 2 months ago. Electronically Signed   By: Richardean Sale M.D.   On: 05/06/2015 16:57   Dg Chest Port 1 View  05/16/2015  CLINICAL DATA:  Weakness and chest pain all day EXAM: PORTABLE CHEST 1 VIEW COMPARISON:  May 07, 2015 FINDINGS: The heart size and mediastinal contours are stable. The heart size is enlarged. There is pulmonary edema. There is no focal pneumonia or pleural effusion. There is mild atelectasis of both lung bases. The visualized skeletal structures are unremarkable. IMPRESSION: Congestive heart failure. Electronically Signed   By: Abelardo Diesel M.D.    On: 05/16/2015 21:02   Dg Chest Port 1 View  05/07/2015  CLINICAL DATA:  Sepsis, UTI, abdominal pain EXAM: PORTABLE CHEST 1 VIEW COMPARISON:  08/23/2014 and 11/21/2012 FINDINGS: Cardiomegaly again noted. Chronic elevation of the right hemidiaphragm. Central mild vascular congestion and mild interstitial prominence bilateral without convincing pulmonary edema. Stable old fracture of the right sixth rib. No segmental infiltrate. Mild basilar atelectasis. Suboptimal study due to patient's large body habitus IMPRESSION: Central mild vascular congestion and mild interstitial prominence bilaterally without convincing pulmonary edema. Suboptimal study due to patient's large body habitus. Chronic elevation of the right hemidiaphragm. No segmental infiltrate. Mild basilar atelectasis. Electronically Signed   By: Lahoma Crocker M.D.   On: 05/07/2015 07:55    ASSESSMENT AND PLAN  1. NSTEMI  - Continue ASA, plavix, metoprolol and statin. DC ASA in one month. Note patient had PTCA and no stent. Denies hematochezia.   2. Paroxysmal atrial fibrillation  - CHA2DS2-Vasc score 5 (HTN, DM, age, female, likely HF)  - Given reduced LV function I would prefer that she be on higher dose beta blocker and less Cardizem. Decrease Cardizem to 180 milligrams daily and increase metoprolol to 50 mg twice a day. Continue to follow heart rate and wean Cardizem to off if possible with higher doses of beta blocker. Continue coumadin.  3. Acute systolic  HF  - 1+ pitting edema on exam; add Lasix 40 mg daily. Follow potassium and renal function. Add lisinopril 2.5 mg daily given reduced LV function. I will change metoprolol to Toprol once titrated.- Note recent admission for sepsis and pyelo, echo obtained during that admission shows EF went down to 35-40%. Discontinue IV nitroglycerin.  4. HTN  5. DM    Signed, Kirk Ruths MD [phone] 05/18/2015 9:25 AM

## 2015-05-18 NOTE — Progress Notes (Signed)
ANTICOAGULATION CONSULT NOTE  Pharmacy Consult:  Restart Coumadin Indication:  H/o afib  Allergies  Allergen Reactions  . Procaine Hcl Anaphylaxis    Patient Measurements: Height: 5\' 9"  (175.3 cm) Weight: 227 lb 15.3 oz (103.4 kg) IBW/kg (Calculated) : 66.2   Vital Signs: Temp: 98 F (36.7 C) (03/18 0700) Temp Source: Oral (03/18 0700) BP: 143/52 mmHg (03/18 0800) Pulse Rate: 91 (03/18 0800)  Labs:  Recent Labs  05/16/15 1925  05/17/15 0146 05/17/15 0708 05/17/15 0843 05/17/15 1448 05/17/15 1832 05/18/15 0247  HGB 11.0*  --   --  9.8*  --  9.5*  --   --   HCT 34.5*  --   --  30.6*  --  29.7*  --   --   PLT 458*  --   --  466*  --  426* 440*  --   LABPROT 18.5*  --   --   --   --   --   --  21.5*  INR 1.54*  --   --   --   --   --   --  1.87*  HEPARINUNFRC  --   --   --   --  0.60  --   --   --   CREATININE 0.96  --   --  1.13*  --  1.21*  --  1.26*  TROPONINI  --   < > 42.66* 47.20*  --  48.33*  --   --   < > = values in this interval not displayed.  Estimated Creatinine Clearance: 50.9 mL/min (by C-G formula based on Cr of 1.26).  Assessment: 40 yof admitted 05/16/2015 with CP. She was on coumadin PTA for h/o afib.  Coumadin was held & started on IV heparin for NSTEMI.  Now s/p cardiac cath/balloon angioplasty, IV heparin dc'd and pharmacy consulted to  restart coumadin. Cardiology note plan [id number]+ plavix x1 month then stop asa continue plavix/warfarin. No bleeding noted.   PTA coumadin dose = 1.25mg  daily (last dose on 05/15/15) INR = 1.54 on admit date 05/16/15. Today Hgb 9.5, PLT 426k  Goal of Therapy:  INR = 2-3  Monitor platelets by anticoagulation protocol: Yes   Plan:  Restart home warfarin 1.25mg  daily Daily INR  Bonnita Nasuti Pharm.D. CPP, BCPS Clinical Pharmacist [phone] 05/18/2015 10:34 AM

## 2015-05-19 DIAGNOSIS — I480 Paroxysmal atrial fibrillation: Secondary | ICD-10-CM

## 2015-05-19 LAB — CBC
HEMATOCRIT: 28.4 % — AB (ref 36.0–46.0)
HEMOGLOBIN: 9 g/dL — AB (ref 12.0–15.0)
MCH: 29.2 pg (ref 26.0–34.0)
MCHC: 31.7 g/dL (ref 30.0–36.0)
MCV: 92.2 fL (ref 78.0–100.0)
Platelets: 417 10*3/uL — ABNORMAL HIGH (ref 150–400)
RBC: 3.08 MIL/uL — AB (ref 3.87–5.11)
RDW: 14.4 % (ref 11.5–15.5)
WBC: 15.2 10*3/uL — AB (ref 4.0–10.5)

## 2015-05-19 LAB — GLUCOSE, CAPILLARY
GLUCOSE-CAPILLARY: 203 mg/dL — AB (ref 65–99)
GLUCOSE-CAPILLARY: 287 mg/dL — AB (ref 65–99)
GLUCOSE-CAPILLARY: 293 mg/dL — AB (ref 65–99)
Glucose-Capillary: 261 mg/dL — ABNORMAL HIGH (ref 65–99)

## 2015-05-19 LAB — BASIC METABOLIC PANEL
ANION GAP: 11 (ref 5–15)
BUN: 11 mg/dL (ref 6–20)
CHLORIDE: 96 mmol/L — AB (ref 101–111)
CO2: 28 mmol/L (ref 22–32)
Calcium: 8.5 mg/dL — ABNORMAL LOW (ref 8.9–10.3)
Creatinine, Ser: 1.3 mg/dL — ABNORMAL HIGH (ref 0.44–1.00)
GFR, EST AFRICAN AMERICAN: 46 mL/min — AB (ref >60)
GFR, EST NON AFRICAN AMERICAN: 40 mL/min — AB (ref >60)
Glucose, Bld: 206 mg/dL — ABNORMAL HIGH (ref 65–99)
POTASSIUM: 4 mmol/L (ref 3.5–5.1)
SODIUM: 135 mmol/L (ref 135–145)

## 2015-05-19 LAB — URINE CULTURE: Culture: >=100000

## 2015-05-19 LAB — PROTIME-INR
INR: 1.96 — AB (ref 0.00–1.49)
Prothrombin Time: 22.3 seconds — ABNORMAL HIGH (ref 11.6–15.2)

## 2015-05-19 MED ORDER — AMOXICILLIN 500 MG PO CAPS
500.0000 mg | ORAL_CAPSULE | Freq: Three times a day (TID) | ORAL | Status: DC
  Administered 2015-05-19 (×2): 500 mg via ORAL
  Filled 2015-05-19 (×3): qty 1

## 2015-05-19 MED ORDER — INSULIN GLARGINE 100 UNIT/ML ~~LOC~~ SOLN
5.0000 [IU] | Freq: Once | SUBCUTANEOUS | Status: AC
  Administered 2015-05-19: 5 [IU] via SUBCUTANEOUS
  Filled 2015-05-19: qty 0.05

## 2015-05-19 MED ORDER — INSULIN ASPART 100 UNIT/ML ~~LOC~~ SOLN
3.0000 [IU] | Freq: Three times a day (TID) | SUBCUTANEOUS | Status: DC
  Administered 2015-05-19 – 2015-05-21 (×6): 3 [IU] via SUBCUTANEOUS

## 2015-05-19 MED ORDER — INSULIN GLARGINE 100 UNIT/ML ~~LOC~~ SOLN
20.0000 [IU] | Freq: Every day | SUBCUTANEOUS | Status: DC
  Administered 2015-05-20 – 2015-05-21 (×2): 20 [IU] via SUBCUTANEOUS
  Filled 2015-05-19 (×2): qty 0.2

## 2015-05-19 MED ORDER — METOPROLOL TARTRATE 100 MG PO TABS
100.0000 mg | ORAL_TABLET | Freq: Two times a day (BID) | ORAL | Status: DC
  Administered 2015-05-19 – 2015-05-21 (×4): 100 mg via ORAL
  Filled 2015-05-19 (×4): qty 1

## 2015-05-19 NOTE — Discharge Instructions (Signed)

## 2015-05-19 NOTE — Progress Notes (Signed)
Patient converted to NSR at 00:44 AM and is currently in NSR.

## 2015-05-19 NOTE — Progress Notes (Signed)
[facility]                                                                              Patient Demographics  [name] [name], is a 75 y.o. female, DOB - Dec 17, 1940, [id number]  Admit date - 05/16/2015   Admitting Physician Ivor Costa, MD  Outpatient Primary MD for the patient is [name], MD  LOS - 3  days    Chief Complaint  Patient presents with  . Weakness       Brief HPI   [name] [name] is a 75 y.o. 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 Patient started developing chest pain around afternoon yesterday which was pressure-like and enveloping the whole chest with diaphoresis. Patient with rectal bleeding to go to her PCP and was referred to the ER. In the ER EKG shows A. fib with nonspecific ST changes. Troponin was markedly elevated around 44. Patient INR was subtherapeutic patient being on Coumadin. On call cardiologist Dr. Philbert Riser was consulted and patient had been admitted for non-ST elevation MI. Patient's chest pain has improved at this time.   Assessment & Plan    Principal Problem:   Non-ST elevation MI (NSTEMI) ([place]) - Troponin elevated 47, patient was placed on IV nitro drip, heparin drip. Cardiology was consulted. - Patient underwent cardiac cath 3/17, moderate LV systolic dysfunction, [id number] stenosed first marginal, balloon angioplasty done, no stent placed. - Per cardiology, continue aspirin, Plavix, metoprolol and statin. Discontinue aspirin in 1 month.  Active Problems:   Seizure disorder (HCC) - Continue Dilantin    A-fib ([place])- converted to normal sinus rhythm - Currently rate controlled - CHADS vasc 5, Coumadin restarted - Per cardiology, recommended higher dose of beta blocker and discontinue Cardizem.    Acute on chronic 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[place]) - 2-D echo 3/10  showed EF of 35-40% with diffuse hypokinesis, - Placed on diuresis by cardiology -Still positive balance of 358 mL, continue Lasix 40 mg daily    Type 2 diabetes mellitus with vascular disease ([place]) uncontrolled - Placed on sliding scale insulin, lantus, meal coverage - Inpatient diabetes consult placed - A1c pending  Code Status: Full code   Family Communication: Discussed in detail with the patient, all imaging results, lab results explained to the patient at the bedside   Disposition Plan: Will obtain PT evaluation  Time Spent in minutes   25 minutes  Procedures  Cardiac cath  Consults   Cardiology  DVT Prophylaxis Coumadin Medications  Scheduled Meds: . aspirin  81 mg Oral Daily  . atorvastatin  40 mg Oral QHS  . bisacodyl  10 mg Rectal Once  . calcium-vitamin D  1 tablet Oral Q breakfast  . cephALEXin  500 mg Oral TID AC & HS  . clopidogrel  75 mg Oral Daily  . escitalopram  20 mg Oral Daily  . furosemide  40 mg Oral Daily  . insulin aspart  0-5 Units Subcutaneous QHS  . insulin aspart  0-9 Units Subcutaneous TID WC  .  insulin glargine  15 Units Subcutaneous Daily  . lisinopril  2.5 mg Oral Daily  . metoprolol tartrate  100 mg Oral BID  . phenytoin  300 mg Oral Daily  . polyethylene glycol  17 g Oral Daily  . sodium chloride flush  3 mL Intravenous Q12H  . warfarin  1.25 mg Oral q1800  . Warfarin - Pharmacist Dosing Inpatient   Does not apply q1800   Continuous Infusions:   PRN Meds:.sodium chloride, acetaminophen **OR** acetaminophen, acetaminophen, LORazepam, morphine injection, ondansetron **OR** ondansetron (ZOFRAN) IV, sodium chloride flush   Antibiotics   Anti-infectives    Start     Dose/Rate Route Frequency Ordered Stop   05/17/15 0130  cephALEXin (KEFLEX) capsule 500 mg     500 mg Oral 3 times daily before meals & bedtime 05/17/15 0053 05/20/15 0629   05/16/15 2145  cefTRIAXone (ROCEPHIN) 1 g in dextrose 5 % 50 mL IVPB     1 g 100 mL/hr over 30  Minutes Intravenous  Once 05/16/15 2139 05/16/15 2301        Subjective:   [name] [name] was seen and examined today.Patient for said she felt short of breath at night, no chest pain.   No fevers or chills.  Patient denies dizziness, abdominal pain, N/V/D/C, new weakness, numbess, tingling.   Objective:   Filed Vitals:   05/19/15 0020 05/19/15 0455 05/19/15 0910 05/19/15 0911  BP: 146/72 145/59 145/89 145/89  Pulse: 75 68 72   Temp: 98.6 F (37 C) 98.4 F (36.9 C)    TempSrc: Oral     Resp: 22 19    Height:      Weight:  100.925 kg (222 lb 8 oz)    SpO2: 100% 100%      Intake/Output Summary (Last 24 hours) at 05/19/15 1027 Last data filed at 05/19/15 0913  Gross per 24 hour  Intake    606 ml  Output    100 ml  Net    506 ml     Wt Readings from Last 3 Encounters:  05/19/15 100.925 kg (222 lb 8 oz)  05/16/15 103.329 kg (227 lb 12.8 oz)  05/11/15 104.373 kg (230 lb 1.6 oz)     Exam  General: Alert and oriented x 3, NAD  HEENT:    Neck:   CVS: S1 S2 clear  Respiratory: Decreased breath sounds at the bases  Abdomen  Soft, nontender, nondistended, + bowel sounds  Ext: no cyanosis clubbing, 2+ edema  Neuro: no new deficits    Skin: No rashes  Psych: Normal affect and demeanor, alert and oriented x3    Data Reviewed:  I have personally reviewed following labs and imaging studies  Micro Results Recent Results (from the past 240 hour(s))  Urine culture     Status: None (Preliminary result)   Collection Time: 05/16/15  9:10 PM  Result Value Ref Range Status   Specimen Description URINE, CLEAN CATCH  Final   Special Requests NONE  Final   Culture   Final    >=100,000 COLONIES/mL ENTEROCOCCUS SPECIES Performed at [facility]    Report Status PENDING  Incomplete    Radiology Reports Ct Abdomen Pelvis Wo Contrast  05/16/2015  CLINICAL DATA:  Belching and constipation. EXAM: CT ABDOMEN AND PELVIS WITHOUT CONTRAST TECHNIQUE: Multidetector  CT imaging of the abdomen and pelvis was performed following the standard protocol without IV contrast. COMPARISON:  05/06/2015 FINDINGS: Lower chest and abdominal wall: There is new septal thickening and pleural  fluid at the bases. Hepatobiliary: No focal liver abnormality.Cholelithiasis without signs of cholecystitis. Calcification near the pancreatic head is likely in the GDA or pancreas. No evidence of calcified choledocholithiasis. Pancreas: Atrophy with 15 mm pancreatic tail lesion. This appeared cystic on recent enhanced imaging. Spleen: Unremarkable. Adrenals/Urinary Tract:  Negative adrenals. Persistent asymmetric right perinephric edema without hydronephrosis. There are bilateral renal cysts and patchy right renal cortical scarring. No hydronephrosis or ureteral calculus. Persistent pericystic haziness and mural gas at the apex. Reproductive:Negative Stomach/Bowel:  No obstruction. No inflammation. Vascular/Lymphatic: No acute vascular abnormality. No mass or adenopathy. Peritoneal: No ascites or pneumoperitoneum. Musculoskeletal: Degenerative changes and levoscoliosis. Remote intertrochanteric right femur fracture with repair and healing. IMPRESSION: 1. Emphysematous cystitis. Persistent asymmetric right perinephric edema which could reflect pyelonephritis. 2. CHF. 3. Cholelithiasis. 4. 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 Reference CT report 03/06/2015 for follow-up recommendations. Electronically Signed   By: Monte Fantasia M.D.   On: 05/16/2015 21:59   Ct Abdomen Pelvis W Contrast  05/06/2015  CLINICAL DATA:  Nausea, vomiting and diarrhea. Patient fell this morning with decreased blood pressure. History of diabetes and hypertension. EXAM: CT ABDOMEN AND PELVIS WITH CONTRAST TECHNIQUE: Multidetector CT imaging of the abdomen and pelvis was performed using the standard protocol following bolus administration of intravenous contrast. CONTRAST:  15mL OMNIPAQUE IOHEXOL 300 MG/ML  SOLN COMPARISON:  CT 03/06/2015.  FINDINGS: Lower chest: Stable atelectasis or scarring in both lung bases. Cardiomegaly and coronary artery calcifications are noted. There is no pleural or pericardial effusion. Hepatobiliary: The liver has a stable appearance without focal abnormality. Multiple small calcified gallstones are again noted. There is no gallbladder wall thickening or significant biliary dilatation. Pancreas: The pancreas appears unchanged with diffuse atrophy and multiple small cystic lesions, measuring up to 1.7 cm in the pancreatic tail on image number 27. No surrounding inflammatory change or pancreatic ductal dilatation. Spleen: Normal in size without focal abnormality. Adrenals/Urinary Tract: The adrenal glands appear stable without suspicious findings. No renal cortical scarring and atrophy are again noted, right-greater-than-left. There are several low-density renal lesions which are grossly stable. There is new mild dilatation of the right renal pelvis with associated wall thickening and mildly delayed contrast excretion. There is also new asymmetric perinephric soft tissue stranding on the right. No ureteral dilatation or urinary tract calculus is seen. There is new air within the urinary bladder lumen. Some of this air may be contained within the bladder wall. No air is seen within the kidneys or ureters. There are no inflammatory changes surrounding the bladder. Stomach/Bowel: No evidence of bowel wall thickening, distention or surrounding inflammatory change. The appendix appears normal. There is mild distal colonic diverticulosis. Vascular/Lymphatic: There are no enlarged abdominal or pelvic lymph nodes. There is diffuse atherosclerosis of the aorta, its branches and the iliac arteries. Reproductive: Unremarkable.  No evidence of adnexal mass. Other: No generalized ascites or free air. The anterior abdominal wall appears unremarkable. Musculoskeletal: No acute or significant osseous findings. There are stable degenerative  changes throughout the spine associated with a mild convex left scoliosis. Previous right proximal femoral ORIF. Chronic T11 compression deformity appears unchanged. IMPRESSION: 1. Interval change in the appearance of the right kidney with moderate perinephric soft tissue stranding, pelvic wall thickening and delayed contrast excretion. No evidence of obstructing calculus. These findings could be secondary to a recently passed calculus, obstruction from previously suspected papillary necrosis or pyelonephritis. 2. Air within the urinary bladder lumen and possibly the right bladder wall. Emphysematous cystitis should be considered in  this diabetic if bladder instrumentation has not been recently performed. Correlation with urine analysis recommended. 3. Cholelithiasis without evidence of cholecystitis or biliary dilatation. 4. 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 Please refer to follow up recommendations issued 2 months ago. Electronically Signed   By: Richardean Sale M.D.   On: 05/06/2015 16:57   Dg Chest Port 1 View  05/16/2015  CLINICAL DATA:  Weakness and chest pain all day EXAM: PORTABLE CHEST 1 VIEW COMPARISON:  May 07, 2015 FINDINGS: The heart size and mediastinal contours are stable. The heart size is enlarged. There is pulmonary edema. There is no focal pneumonia or pleural effusion. There is mild atelectasis of both lung bases. The visualized skeletal structures are unremarkable. IMPRESSION: Congestive heart failure. Electronically Signed   By: Abelardo Diesel M.D.   On: 05/16/2015 21:02   Dg Chest Port 1 View  05/07/2015  CLINICAL DATA:  Sepsis, UTI, abdominal pain EXAM: PORTABLE CHEST 1 VIEW COMPARISON:  08/23/2014 and 11/21/2012 FINDINGS: Cardiomegaly again noted. Chronic elevation of the right hemidiaphragm. Central mild vascular congestion and mild interstitial prominence bilateral without convincing pulmonary edema. Stable old fracture of the right sixth rib. No  segmental infiltrate. Mild basilar atelectasis. Suboptimal study due to patient's large body habitus IMPRESSION: Central mild vascular congestion and mild interstitial prominence bilaterally without convincing pulmonary edema. Suboptimal study due to patient's large body habitus. Chronic elevation of the right hemidiaphragm. No segmental infiltrate. Mild basilar atelectasis. Electronically Signed   By: Lahoma Crocker M.D.   On: 05/07/2015 07:55    CBC  Recent Labs Lab 05/16/15 1925 05/17/15 0708 05/17/15 1448 05/17/15 1832 05/19/15 0518  WBC 15.5* 18.2* 17.7*  --  15.2*  HGB 11.0* 9.8* 9.5*  --  9.0*  HCT 34.5* 30.6* 29.7*  --  28.4*  PLT 458* 466* 426* 440* 417*  MCV 93.2 91.3 91.7  --  92.2  MCH 29.7 29.3 29.3  --  29.2  MCHC 31.9 32.0 32.0  --  31.7  RDW 14.0 14.1 14.2  --  14.4  LYMPHSABS 2.3 1.6  --   --   --   MONOABS 0.8 1.5*  --   --   --   EOSABS 0.3 0.0  --   --   --   BASOSABS 0.0 0.0  --   --   --     Chemistries   Recent Labs Lab 05/16/15 1925 05/17/15 0708 05/17/15 1448 05/18/15 0247 05/19/15 0518  NA 136 137  --  137 135  K 3.2* 4.3  --  4.0 4.0  CL 96* 96*  --  95* 96*  CO2 27 30  --  30 28  GLUCOSE 166* 198*  --  237* 206*  BUN 11 9  --  10 11  CREATININE 0.96 1.13* 1.21* 1.26* 1.30*  CALCIUM 8.2* 8.4*  --  8.4* 8.5*  AST 163* 142*  --   --   --   ALT 22 21  --   --   --   ALKPHOS 101 103  --   --   --   BILITOT 0.5 0.8  --   --   --    ------------------------------------------------------------------------------------------------------------------ estimated creatinine clearance is 48.7 mL/min (by C-G formula based on Cr of 1.3). ------------------------------------------------------------------------------------------------------------------ No results for input(s): HGBA1C in the last 72 hours. ------------------------------------------------------------------------------------------------------------------ No results for input(s): CHOL, HDL,  LDLCALC, TRIG, CHOLHDL, LDLDIRECT in the last 72 hours. ------------------------------------------------------------------------------------------------------------------ No results for input(s): TSH, T4TOTAL, T3FREE, THYROIDAB  in the last 72 hours.  Invalid input(s): FREET3 ------------------------------------------------------------------------------------------------------------------ No results for input(s): VITAMINB12, FOLATE, FERRITIN, TIBC, IRON, RETICCTPCT in the last 72 hours.  Coagulation profile  Recent Labs Lab 05/16/15 1925 05/18/15 0247 05/19/15 0518  INR 1.54* 1.87* 1.96*    No results for input(s): DDIMER in the last 72 hours.  Cardiac Enzymes  Recent Labs Lab 05/17/15 0146 05/17/15 0708 05/17/15 1448  TROPONINI 42.66* 47.20* 48.33*   ------------------------------------------------------------------------------------------------------------------ Invalid input(s): POCBNP   Recent Labs  05/17/15 2104 05/18/15 0607 05/18/15 1205 05/18/15 1638 05/18/15 2116 05/19/15 0751  GLUCAP 288* 236* 330* 261* 30* 27*     RAI,RIPUDEEP M.D. [facility] 05/19/2015, 10:27 AM  Pager: [phone] Between 7am to 7pm - call Pager - [phone]  After 7pm go to [email or web address] - password [email or web address]  Call night coverage person covering after 7pm

## 2015-05-19 NOTE — Progress Notes (Signed)
ANTICOAGULATION CONSULT NOTE  Pharmacy Consult:  Restart Coumadin Indication:  H/o afib  Allergies  Allergen Reactions  . Procaine Hcl Anaphylaxis    Patient Measurements: Height: 5\' 9"  (175.3 cm) Weight: 222 lb 8 oz (100.925 kg) IBW/kg (Calculated) : 66.2   Vital Signs: Temp: 98.4 F (36.9 C) (03/19 0455) Temp Source: Oral (03/19 0020) BP: 145/59 mmHg (03/19 0455) Pulse Rate: 68 (03/19 0455)  Labs:  Recent Labs  05/16/15 1925  05/17/15 0146 05/17/15 0708 05/17/15 0843 05/17/15 1448 05/17/15 1832 05/18/15 0247 05/19/15 0518  HGB 11.0*  --   --  9.8*  --  9.5*  --   --  9.0*  HCT 34.5*  --   --  30.6*  --  29.7*  --   --  28.4*  PLT 458*  --   --  466*  --  426* 440*  --  417*  LABPROT 18.5*  --   --   --   --   --   --  21.5* 22.3*  INR 1.54*  --   --   --   --   --   --  1.87* 1.96*  HEPARINUNFRC  --   --   --   --  0.60  --   --   --   --   CREATININE 0.96  --   --  1.13*  --  1.21*  --  1.26* 1.30*  TROPONINI  --   < > 42.66* 47.20*  --  48.33*  --   --   --   < > = values in this interval not displayed.  Estimated Creatinine Clearance: 48.7 mL/min (by C-G formula based on Cr of 1.3).  Assessment: 37 yof admitted 05/16/2015 with CP. She was on coumadin PTA for h/o afib.  Coumadin was held & started on IV heparin for NSTEMI.  Now s/p cardiac cath/balloon angioplasty, IV heparin dc'd and pharmacy consulted to  restart coumadin. Cardiology note plan [id number]+ plavix +warf x1 month then stop asa continue plavix/warfarin. No bleeding noted.   INR 1.86 > 1.96 after boost 2.5mg  and now back on home dose - continue  No bleeding CBC stable  PTA coumadin dose = 1.25mg  daily (last dose on 05/15/15)   Goal of Therapy:  INR = 2-3  Monitor platelets by anticoagulation protocol: Yes   Plan:  Restart home warfarin 1.25mg  daily Daily INR  Bonnita Nasuti Pharm.D. CPP, BCPS Clinical Pharmacist [phone] 05/19/2015 8:14 AM

## 2015-05-19 NOTE — Progress Notes (Signed)
Patient Name: [name] [name] Date of Encounter: 05/19/2015  Primary Cardiologist: Dr. Gwenlyn Found     SUBJECTIVE  No chest pain or dyspnea  CURRENT MEDS . aspirin  81 mg Oral Daily  . atorvastatin  40 mg Oral QHS  . bisacodyl  10 mg Rectal Once  . calcium-vitamin D  1 tablet Oral Q breakfast  . cephALEXin  500 mg Oral TID AC & HS  . clopidogrel  75 mg Oral Daily  . diltiazem  180 mg Oral Daily  . escitalopram  20 mg Oral Daily  . furosemide  40 mg Oral Daily  . insulin aspart  0-5 Units Subcutaneous QHS  . insulin aspart  0-9 Units Subcutaneous TID WC  . insulin glargine  15 Units Subcutaneous Daily  . lisinopril  2.5 mg Oral Daily  . metoprolol tartrate  50 mg Oral BID  . phenytoin  300 mg Oral Daily  . polyethylene glycol  17 g Oral Daily  . sodium chloride flush  3 mL Intravenous Q12H  . warfarin  1.25 mg Oral q1800  . Warfarin - Pharmacist Dosing Inpatient   Does not apply q1800    OBJECTIVE  Filed Vitals:   05/18/15 2000 05/18/15 2106 05/19/15 0020 05/19/15 0455  BP: 141/63 141/67 146/72 145/59  Pulse: 85 96 75 68  Temp: 98.5 F (36.9 C) 98.1 F (36.7 C) 98.6 F (37 C) 98.4 F (36.9 C)  TempSrc:  Oral Oral   Resp: 20  22 19   Height:      Weight:    222 lb 8 oz (100.925 kg)  SpO2: 96% 98% 100% 100%    Intake/Output Summary (Last 24 hours) at 05/19/15 0827 Last data filed at 05/19/15 0020  Gross per 24 hour  Intake    843 ml  Output   1000 ml  Net   -157 ml   Filed Weights   05/17/15 0003 05/18/15 0415 05/19/15 0455  Weight: 225 lb 12.8 oz (102.422 kg) 227 lb 15.3 oz (103.4 kg) 222 lb 8 oz (100.925 kg)    PHYSICAL EXAM  General: Pleasant, NAD. Neuro: grossly intact Psych: Normal affect. HEENT:  Normal  Neck: Supple Lungs:  CTA. Heart: irregular Abdomen: Soft, non-tender, non-distended, BS + x 4.  Extremities: 1+  edema  Accessory Clinical Findings  CBC  Recent Labs  05/16/15 1925 05/17/15 0708 05/17/15 1448 05/17/15 1832  05/19/15 0518  WBC 15.5* 18.2* 17.7*  --  15.2*  NEUTROABS 12.1* 15.1*  --   --   --   HGB 11.0* 9.8* 9.5*  --  9.0*  HCT 34.5* 30.6* 29.7*  --  28.4*  MCV 93.2 91.3 91.7  --  92.2  PLT 458* 466* 426* 440* [id number]*   Basic Metabolic Panel  Recent Labs  05/18/15 0247 05/19/15 0518  NA 137 135  K 4.0 4.0  CL 95* 96*  CO2 30 28  GLUCOSE 237* 206*  BUN 10 11  CREATININE 1.26* 1.30*  CALCIUM 8.4* 8.5*   Liver Function Tests  Recent Labs  05/16/15 1925 05/17/15 0708  AST 163* 142*  ALT 22 21  ALKPHOS 101 103  BILITOT 0.5 0.8  PROT 7.1 6.4*  ALBUMIN 2.9* 2.5*   Cardiac Enzymes  Recent Labs  05/17/15 0146 05/17/15 0708 05/17/15 1448  TROPONINI 42.66* 47.20* 48.33*    TELE afib     Echocardiogram 05/10/2015  LV EF: 35% - 40%  ------------------------------------------------------------------- Indications: Atrial fibrillation - 427.31.  ------------------------------------------------------------------- History: PMH: Elevated Troponin.  EKG changes. Risk factors: Hypertension. Diabetes mellitus. Dyslipidemia.  ------------------------------------------------------------------- Study Conclusions  - Left ventricle: The cavity size was normal. There was moderate  concentric hypertrophy. Systolic function was moderately reduced.  The estimated ejection fraction was in the range of 35% to 40%.  Diffuse hypokinesis. The study was not technically sufficient to  allow evaluation of LV diastolic dysfunction due to atrial  fibrillation. - Aortic valve: Trileaflet; normal thickness leaflets. There was no  regurgitation. - Aortic root: The aortic root was normal in size. - Mitral valve: Mildly thickened leaflets . There was mild  regurgitation. - Left atrium: The atrium was moderately dilated. - Right ventricle: The cavity size was normal. Wall thickness was  normal. Systolic function was normal. - Right atrium: The atrium was normal in  size. - Tricuspid valve: There was moderate regurgitation. - Pulmonic valve: There was no regurgitation. - Pulmonary arteries: Systolic pressure was mildly to moderately  increased. PA peak pressure: 45 mm Hg (S). - Inferior vena cava: The vessel was normal in size. - Pericardium, extracardiac: There was no pericardial effusion.     Radiology/Studies  Ct Abdomen Pelvis Wo Contrast  05/16/2015  CLINICAL DATA:  Belching and constipation. EXAM: CT ABDOMEN AND PELVIS WITHOUT CONTRAST TECHNIQUE: Multidetector CT imaging of the abdomen and pelvis was performed following the standard protocol without IV contrast. COMPARISON:  05/06/2015 FINDINGS: Lower chest and abdominal wall: There is new septal thickening and pleural fluid at the bases. Hepatobiliary: No focal liver abnormality.Cholelithiasis without signs of cholecystitis. Calcification near the pancreatic head is likely in the GDA or pancreas. No evidence of calcified choledocholithiasis. Pancreas: Atrophy with 15 mm pancreatic tail lesion. This appeared cystic on recent enhanced imaging. Spleen: Unremarkable. Adrenals/Urinary Tract:  Negative adrenals. Persistent asymmetric right perinephric edema without hydronephrosis. There are bilateral renal cysts and patchy right renal cortical scarring. No hydronephrosis or ureteral calculus. Persistent pericystic haziness and mural gas at the apex. Reproductive:Negative Stomach/Bowel:  No obstruction. No inflammation. Vascular/Lymphatic: No acute vascular abnormality. No mass or adenopathy. Peritoneal: No ascites or pneumoperitoneum. Musculoskeletal: Degenerative changes and levoscoliosis. Remote intertrochanteric right femur fracture with repair and healing. IMPRESSION: 1. Emphysematous cystitis. Persistent asymmetric right perinephric edema which could reflect pyelonephritis. 2. CHF. 3. Cholelithiasis. 4. 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 Reference CT report 03/06/2015 for follow-up recommendations. Electronically  Signed   By: Monte Fantasia M.D.   On: 05/16/2015 21:59   Ct Abdomen Pelvis W Contrast  05/06/2015  CLINICAL DATA:  Nausea, vomiting and diarrhea. Patient fell this morning with decreased blood pressure. History of diabetes and hypertension. EXAM: CT ABDOMEN AND PELVIS WITH CONTRAST TECHNIQUE: Multidetector CT imaging of the abdomen and pelvis was performed using the standard protocol following bolus administration of intravenous contrast. CONTRAST:  10mL OMNIPAQUE IOHEXOL 300 MG/ML  SOLN COMPARISON:  CT 03/06/2015. FINDINGS: Lower chest: Stable atelectasis or scarring in both lung bases. Cardiomegaly and coronary artery calcifications are noted. There is no pleural or pericardial effusion. Hepatobiliary: The liver has a stable appearance without focal abnormality. Multiple small calcified gallstones are again noted. There is no gallbladder wall thickening or significant biliary dilatation. Pancreas: The pancreas appears unchanged with diffuse atrophy and multiple small cystic lesions, measuring up to 1.7 cm in the pancreatic tail on image number 27. No surrounding inflammatory change or pancreatic ductal dilatation. Spleen: Normal in size without focal abnormality. Adrenals/Urinary Tract: The adrenal glands appear stable without suspicious findings. No renal cortical scarring and atrophy are again noted, right-greater-than-left. There are several low-density renal lesions  which are grossly stable. There is new mild dilatation of the right renal pelvis with associated wall thickening and mildly delayed contrast excretion. There is also new asymmetric perinephric soft tissue stranding on the right. No ureteral dilatation or urinary tract calculus is seen. There is new air within the urinary bladder lumen. Some of this air may be contained within the bladder wall. No air is seen within the kidneys or ureters. There are no inflammatory changes surrounding the bladder. Stomach/Bowel: No evidence of bowel wall  thickening, distention or surrounding inflammatory change. The appendix appears normal. There is mild distal colonic diverticulosis. Vascular/Lymphatic: There are no enlarged abdominal or pelvic lymph nodes. There is diffuse atherosclerosis of the aorta, its branches and the iliac arteries. Reproductive: Unremarkable.  No evidence of adnexal mass. Other: No generalized ascites or free air. The anterior abdominal wall appears unremarkable. Musculoskeletal: No acute or significant osseous findings. There are stable degenerative changes throughout the spine associated with a mild convex left scoliosis. Previous right proximal femoral ORIF. Chronic T11 compression deformity appears unchanged. IMPRESSION: 1. Interval change in the appearance of the right kidney with moderate perinephric soft tissue stranding, pelvic wall thickening and delayed contrast excretion. No evidence of obstructing calculus. These findings could be secondary to a recently passed calculus, obstruction from previously suspected papillary necrosis or pyelonephritis. 2. Air within the urinary bladder lumen and possibly the right bladder wall. Emphysematous cystitis should be considered in this diabetic if bladder instrumentation has not been recently performed. Correlation with urine analysis recommended. 3. Cholelithiasis without evidence of cholecystitis or biliary dilatation. 4. 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 Please refer to follow up recommendations issued 2 months ago. Electronically Signed   By: Richardean Sale M.D.   On: 05/06/2015 16:57   Dg Chest Port 1 View  05/16/2015  CLINICAL DATA:  Weakness and chest pain all day EXAM: PORTABLE CHEST 1 VIEW COMPARISON:  May 07, 2015 FINDINGS: The heart size and mediastinal contours are stable. The heart size is enlarged. There is pulmonary edema. There is no focal pneumonia or pleural effusion. There is mild atelectasis of both lung bases. The visualized skeletal  structures are unremarkable. IMPRESSION: Congestive heart failure. Electronically Signed   By: Abelardo Diesel M.D.   On: 05/16/2015 21:02   Dg Chest Port 1 View  05/07/2015  CLINICAL DATA:  Sepsis, UTI, abdominal pain EXAM: PORTABLE CHEST 1 VIEW COMPARISON:  08/23/2014 and 11/21/2012 FINDINGS: Cardiomegaly again noted. Chronic elevation of the right hemidiaphragm. Central mild vascular congestion and mild interstitial prominence bilateral without convincing pulmonary edema. Stable old fracture of the right sixth rib. No segmental infiltrate. Mild basilar atelectasis. Suboptimal study due to patient's large body habitus IMPRESSION: Central mild vascular congestion and mild interstitial prominence bilaterally without convincing pulmonary edema. Suboptimal study due to patient's large body habitus. Chronic elevation of the right hemidiaphragm. No segmental infiltrate. Mild basilar atelectasis. Electronically Signed   By: Lahoma Crocker M.D.   On: 05/07/2015 07:55    ASSESSMENT AND PLAN  1. NSTEMI  - Continue ASA, plavix, metoprolol and statin. DC ASA in one month. Note patient had PTCA and no stent. Denies hematochezia.   2. Paroxysmal atrial fibrillation  - CHA2DS2-Vasc score 5 (HTN, DM, age, female, likely HF)  - Patient has converted to sinus. Given reduced LV function I would prefer that she be on higher dose beta blocker. D/C cardizem and increase metoprolol to 100 mg twice a day. Change to toprol if she tolerates higher  dose metoprolol. Continue coumadin.  3. Acute systolic HF  - 1+ pitting edema on exam, improving. Continue Lasix 40 mg daily. Follow potassium and renal function. Continue lisinopril 2.5 mg daily given reduced LV function. Advance as tolerated. I will change metoprolol to Toprol once titrated. Note recent admission for sepsis and pyelo, echo obtained during that admission shows EF went down to 35-40%.   4. HTN  5. DM    Signed, Kirk Ruths MD [phone] 05/19/2015 8:27  AM

## 2015-05-19 NOTE — Care Management Note (Signed)
Case Management Note  Patient Details  Name: MATEJA HOUSEHOLDER MRN: [id number] Date of Birth: 1941/02/16  Subjective/Objective:                  Non-ST elevation MI (NSTEMI) [facility]) Action/Plan: Discharge planning Expected Discharge Date:                  Expected Discharge Plan:     In-House Referral:     Discharge planning Services  CM Consult  Post Acute Care Choice:    Choice offered to:     DME Arranged:    DME Agency:     HH Arranged:    [place] Agency:     Status of Service:  In process, will continue to follow  Medicare Important Message Given:    Date Medicare IM Given:    Medicare IM give by:    Date Additional Medicare IM Given:    Additional Medicare Important Message give by:     If discussed at [place] of Stay Meetings, dates discussed:    Additional Comments: Pt is active with [facility] for HHPT/RN.  CM called [facility] rep, tiffany who states she is following.  When pt is ready to discharge, PLEASE place home health resumption orders.  CM will follow. Dellie Catholic, RN 05/19/2015, 11:30 AM

## 2015-05-20 ENCOUNTER — Inpatient Hospital Stay: Payer: Medicare Other | Admitting: Internal Medicine

## 2015-05-20 ENCOUNTER — Encounter (HOSPITAL_COMMUNITY): Payer: Self-pay | Admitting: Radiology

## 2015-05-20 ENCOUNTER — Inpatient Hospital Stay (HOSPITAL_COMMUNITY): Payer: Medicare Other

## 2015-05-20 DIAGNOSIS — Z9861 Coronary angioplasty status: Secondary | ICD-10-CM

## 2015-05-20 DIAGNOSIS — I480 Paroxysmal atrial fibrillation: Secondary | ICD-10-CM

## 2015-05-20 DIAGNOSIS — E785 Hyperlipidemia, unspecified: Secondary | ICD-10-CM

## 2015-05-20 DIAGNOSIS — I5021 Acute systolic (congestive) heart failure: Secondary | ICD-10-CM

## 2015-05-20 DIAGNOSIS — I255 Ischemic cardiomyopathy: Secondary | ICD-10-CM

## 2015-05-20 DIAGNOSIS — I4891 Unspecified atrial fibrillation: Secondary | ICD-10-CM | POA: Insufficient documentation

## 2015-05-20 DIAGNOSIS — I110 Hypertensive heart disease with heart failure: Secondary | ICD-10-CM

## 2015-05-20 DIAGNOSIS — I2510 Atherosclerotic heart disease of native coronary artery without angina pectoris: Secondary | ICD-10-CM

## 2015-05-20 DIAGNOSIS — R8271 Bacteriuria: Secondary | ICD-10-CM

## 2015-05-20 LAB — GLUCOSE, CAPILLARY
GLUCOSE-CAPILLARY: 151 mg/dL — AB (ref 65–99)
GLUCOSE-CAPILLARY: 185 mg/dL — AB (ref 65–99)
GLUCOSE-CAPILLARY: 202 mg/dL — AB (ref 65–99)
Glucose-Capillary: 212 mg/dL — ABNORMAL HIGH (ref 65–99)
Glucose-Capillary: 117 mg/dL — ABNORMAL HIGH (ref 65–99)

## 2015-05-20 LAB — LIPASE, BLOOD: Lipase: 15 U/L (ref 11–51)

## 2015-05-20 LAB — CBC WITH DIFFERENTIAL/PLATELET
BASOS PCT: 0 %
Basophils Absolute: 0 10*3/uL (ref 0.0–0.1)
EOS ABS: 0.1 10*3/uL (ref 0.0–0.7)
Eosinophils Relative: 1 %
HEMATOCRIT: 28.4 % — AB (ref 36.0–46.0)
Hemoglobin: 9.3 g/dL — ABNORMAL LOW (ref 12.0–15.0)
Lymphocytes Relative: 8 %
Lymphs Abs: 1.3 10*3/uL (ref 0.7–4.0)
MCH: 30.2 pg (ref 26.0–34.0)
MCHC: 32.7 g/dL (ref 30.0–36.0)
MCV: 92.2 fL (ref 78.0–100.0)
MONO ABS: 1.7 10*3/uL — AB (ref 0.1–1.0)
MONOS PCT: 11 %
Neutro Abs: 12.4 10*3/uL — ABNORMAL HIGH (ref 1.7–7.7)
Neutrophils Relative %: 80 %
Platelets: 413 10*3/uL — ABNORMAL HIGH (ref 150–400)
RBC: 3.08 MIL/uL — ABNORMAL LOW (ref 3.87–5.11)
RDW: 14.4 % (ref 11.5–15.5)
WBC: 15.5 10*3/uL — ABNORMAL HIGH (ref 4.0–10.5)

## 2015-05-20 LAB — URINE MICROSCOPIC-ADD ON: RBC / HPF: NONE SEEN RBC/hpf (ref 0–5)

## 2015-05-20 LAB — COMPREHENSIVE METABOLIC PANEL
ALBUMIN: 2.6 g/dL — AB (ref 3.5–5.0)
ALK PHOS: 112 U/L (ref 38–126)
ALT: 18 U/L (ref 14–54)
AST: 39 U/L (ref 15–41)
Anion gap: 10 (ref 5–15)
BUN: 13 mg/dL (ref 6–20)
CALCIUM: 8.8 mg/dL — AB (ref 8.9–10.3)
CO2: 31 mmol/L (ref 22–32)
CREATININE: 1.32 mg/dL — AB (ref 0.44–1.00)
Chloride: 95 mmol/L — ABNORMAL LOW (ref 101–111)
GFR calc Af Amer: 45 mL/min — ABNORMAL LOW (ref >60)
GFR calc non Af Amer: 39 mL/min — ABNORMAL LOW (ref >60)
GLUCOSE: 156 mg/dL — AB (ref 65–99)
Potassium: 4.4 mmol/L (ref 3.5–5.1)
SODIUM: 136 mmol/L (ref 135–145)
Total Bilirubin: 0.5 mg/dL (ref 0.3–1.2)
Total Protein: 6.7 g/dL (ref 6.5–8.1)

## 2015-05-20 LAB — TROPONIN I
TROPONIN I: 15.66 ng/mL — AB (ref <0.031)
Troponin I: 13.12 ng/mL (ref <0.031)
Troponin I: 10.69 ng/mL (ref <0.031)

## 2015-05-20 LAB — URINALYSIS, ROUTINE W REFLEX MICROSCOPIC
BILIRUBIN URINE: NEGATIVE
Glucose, UA: 250 mg/dL — AB
Ketones, ur: NEGATIVE mg/dL
Nitrite: NEGATIVE
Protein, ur: 100 mg/dL — AB
SPECIFIC GRAVITY, URINE: 1.024 (ref 1.005–1.030)
pH: 5.5 (ref 5.0–8.0)

## 2015-05-20 LAB — HEMOGLOBIN A1C
HEMOGLOBIN A1C: 7 % — AB (ref 4.8–5.6)
MEAN PLASMA GLUCOSE: 154 mg/dL

## 2015-05-20 LAB — PROTIME-INR
INR: 2.07 — AB (ref 0.00–1.49)
PROTHROMBIN TIME: 23.2 s — AB (ref 11.6–15.2)

## 2015-05-20 LAB — BRAIN NATRIURETIC PEPTIDE: B NATRIURETIC PEPTIDE 5: 1570.5 pg/mL — AB (ref 0.0–100.0)

## 2015-05-20 MED ORDER — FUROSEMIDE 40 MG PO TABS
40.0000 mg | ORAL_TABLET | Freq: Two times a day (BID) | ORAL | Status: DC
  Administered 2015-05-20 – 2015-05-21 (×2): 40 mg via ORAL
  Filled 2015-05-20 (×2): qty 1

## 2015-05-20 MED ORDER — NITROGLYCERIN 0.4 MG SL SUBL
0.4000 mg | SUBLINGUAL_TABLET | SUBLINGUAL | Status: DC | PRN
  Administered 2015-05-20: 0.4 mg via SUBLINGUAL

## 2015-05-20 MED ORDER — ALBUTEROL SULFATE (2.5 MG/3ML) 0.083% IN NEBU
2.5000 mg | INHALATION_SOLUTION | Freq: Four times a day (QID) | RESPIRATORY_TRACT | Status: DC | PRN
  Administered 2015-05-20: 2.5 mg via RESPIRATORY_TRACT
  Filled 2015-05-20: qty 3

## 2015-05-20 MED ORDER — MORPHINE SULFATE (PF) 2 MG/ML IV SOLN: 1.0000 mg | INTRAVENOUS | Status: DC | PRN

## 2015-05-20 MED ORDER — FUROSEMIDE 10 MG/ML IJ SOLN
40.0000 mg | Freq: Once | INTRAMUSCULAR | Status: AC
  Administered 2015-05-20: 40 mg via INTRAVENOUS
  Filled 2015-05-20: qty 4

## 2015-05-20 MED ORDER — NITROGLYCERIN 0.4 MG SL SUBL
SUBLINGUAL_TABLET | SUBLINGUAL | Status: AC
  Filled 2015-05-20: qty 1

## 2015-05-20 MED ORDER — ATORVASTATIN CALCIUM 80 MG PO TABS
80.0000 mg | ORAL_TABLET | Freq: Every day | ORAL | Status: DC
  Administered 2015-05-20: 80 mg via ORAL
  Filled 2015-05-20: qty 1

## 2015-05-20 MED ORDER — PROMETHAZINE HCL 25 MG/ML IJ SOLN
12.5000 mg | Freq: Four times a day (QID) | INTRAMUSCULAR | Status: DC | PRN
  Administered 2015-05-20: 12.5 mg via INTRAVENOUS
  Filled 2015-05-20: qty 1

## 2015-05-20 MED ORDER — SODIUM CHLORIDE 0.9 % IV SOLN
500.0000 mg | Freq: Four times a day (QID) | INTRAVENOUS | Status: DC
  Administered 2015-05-20 (×2): 500 mg via INTRAVENOUS
  Filled 2015-05-20 (×5): qty 2

## 2015-05-20 MED ORDER — NITROGLYCERIN 0.4 MG SL SUBL: 0.4000 mg | SUBLINGUAL_TABLET | SUBLINGUAL | Status: DC | PRN

## 2015-05-20 NOTE — Progress Notes (Signed)
[facility]                                                                              Patient Demographics  [name] [name], is a 75 y.o. female, DOB - 09-Aug-1940, [id number]  Admit date - 05/16/2015   Admitting Physician Ivor Costa, MD  Outpatient Primary MD for the patient is [name] November, MD  LOS - 4  days    Chief Complaint  Patient presents with  . Weakness       Brief HPI   [name] [name] is a 74 y.o. 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 Patient started developing chest pain around afternoon yesterday which was pressure-like and enveloping the whole chest with diaphoresis. Patient with rectal bleeding to go to her PCP and was referred to the ER. In the ER EKG shows A. fib with nonspecific ST changes. Troponin was markedly elevated around 44. Patient INR was subtherapeutic patient being on Coumadin. On call cardiologist Dr. Philbert Riser was consulted and patient had been admitted for non-ST elevation MI. Patient's chest pain has improved at this time.   Assessment & Plan    Principal Problem:   Non-ST elevation MI (NSTEMI) ([place]) - Troponin elevated 47, patient was placed on IV nitro drip, heparin drip. Cardiology was consulted. - Patient underwent cardiac cath 3/17, moderate LV systolic dysfunction, [id number] stenosed first marginal, balloon angioplasty done, no stent placed. - Per cardiology, continue aspirin, Plavix, metoprolol and statin. Discontinue aspirin in 1 month.  Active Problems: Enterococcus UTI with pyelonephritis - Patient recently had Escherichia coli pyelonephritis and was placed on cephalexin by urology, was continued during this hospitalization. However urine culture resulted on 3/19 as enterococcus, hence antibiotics were changed to amoxicillin. - Given the overnight  events with acute abdominal pain, dysuria CT abdomen was obtained which showed persistent emphysematous cystitis, new increased density of the urine in the bladder suggest hemorrhage, pyelonephritis - Changed antibiotics to IV ampicillin per urine culture results, may need IV antibiotics/PICC line - Consulted ID, d/w Dr Linus Salmons, ordered blood cultures - d/w Dr Jeffie Pollock (Urology), discussed the CT results, recommended foley catheter to empty the bladder, leave foley in and f/u with Dr Noah Delaine in 2 weeks.     Acute on chronic 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[place]) - 2-D echo 3/10 showed EF of 35-40% with diffuse hypokinesis, - Placed on diuresis by cardiology, still positive balance of 458 mL, continue Lasix 40 mg daily - Overnight received extra Lasix 40mg  IV x1 due to acute shortness of breath, BNP was elevated 1570. Will defer to cardiology regarding Lasix dose, may need to increase it.    Seizure disorder ([facility]) - Continue Dilantin    A-fib ([place])- converted to normal sinus rhythm - Currently rate controlled - CHADS vasc 5, Coumadin restarted - Per cardiology, recommended higher dose of beta blocker and discontinue Cardizem.   Type 2 diabetes mellitus with vascular disease ([place]) uncontrolled - Placed on sliding scale insulin, lantus, meal coverage - Inpatient diabetes consult placed - A1c  7.0  Code Status: Full code   Family Communication: Discussed in detail with the patient, all imaging results, lab results explained to the patient and husband at bedside   Disposition Plan: PT recommending skilled nursing facility  Time Spent in minutes   25 minutes  Procedures  Cardiac cath  Consults   Cardiology  DVT Prophylaxis Coumadin Medications  Scheduled Meds: . ampicillin (OMNIPEN) IV  500 mg Intravenous 4 times per day  . aspirin  81 mg Oral Daily  . atorvastatin  40 mg Oral QHS  . bisacodyl  10 mg Rectal Once  . calcium-vitamin D  1 tablet Oral Q breakfast  . clopidogrel  75 mg Oral Daily  .  escitalopram  20 mg Oral Daily  . furosemide  40 mg Oral Daily  . insulin aspart  0-5 Units Subcutaneous QHS  . insulin aspart  0-9 Units Subcutaneous TID WC  . insulin aspart  3 Units Subcutaneous TID WC  . insulin glargine  20 Units Subcutaneous Daily  . lisinopril  2.5 mg Oral Daily  . metoprolol tartrate  100 mg Oral BID  . phenytoin  300 mg Oral Daily  . polyethylene glycol  17 g Oral Daily  . sodium chloride flush  3 mL Intravenous Q12H  . warfarin  1.25 mg Oral q1800  . Warfarin - Pharmacist Dosing Inpatient   Does not apply q1800   Continuous Infusions:   PRN Meds:.sodium chloride, acetaminophen **OR** acetaminophen, acetaminophen, albuterol, LORazepam, morphine injection, nitroGLYCERIN, ondansetron **OR** ondansetron (ZOFRAN) IV, sodium chloride flush   Antibiotics   Anti-infectives    Start     Dose/Rate Route Frequency Ordered Stop   05/20/15 0415  ampicillin (OMNIPEN) 500 mg in sodium chloride 0.9 % 50 mL IVPB     500 mg 150 mL/hr over 20 Minutes Intravenous 4 times per day 05/20/15 0407     05/19/15 1400  amoxicillin (AMOXIL) capsule 500 mg  Status:  Discontinued     500 mg Oral 3 times per day 05/19/15 1159 05/20/15 0407   05/17/15 0130  cephALEXin (KEFLEX) capsule 500 mg  Status:  Discontinued     500 mg Oral 3 times daily before meals & bedtime 05/17/15 0053 05/19/15 1159   05/16/15 2145  cefTRIAXone (ROCEPHIN) 1 g in dextrose 5 % 50 mL IVPB     1 g 100 mL/hr over 30 Minutes Intravenous  Once 05/16/15 2139 05/16/15 2301        Subjective:   [name] [name] was seen and examined today.overnight events noted, patient was acutely short of breath with generalized pain, abdominal pain, dysuria., Received Lasix 40 mg IV 1. CT of the abdomen consistent with pyelonephritis. Patient now feeling better this morning, husband at the bedside. No chest pain at this time.  No fevers or chills.  Patient denies dizziness, D/C, new weakness, numbess, tingling.   Objective:    Filed Vitals:   05/20/15 0040 05/20/15 0335 05/20/15 0410 05/20/15 0540  BP: 139/76  165/72 142/52  Pulse:   65 62  Temp: 97.9 F (36.6 C)     TempSrc: Oral     Resp: 20  24 17   Height:      Weight:      SpO2: 92% 95% 99% 99%    Intake/Output Summary (Last 24 hours) at 05/20/15 1203 Last data filed at 05/20/15 0800  Gross per 24 hour  Intake    600 ml  Output    500 ml  Net  100 ml     Wt Readings from Last 3 Encounters:  05/19/15 100.925 kg (222 lb 8 oz)  05/16/15 103.329 kg (227 lb 12.8 oz)  05/11/15 104.373 kg (230 lb 1.6 oz)     Exam  General: Alert and oriented x 3, NAD currently comfortable  HEENT:    Neck:   CVS: S1 S2 clear  Respiratory: Decreased breath sounds at the bases, mild bibasilar crackles  Abdomen  Soft, no tenderness on deep palpation, normal bowel sounds  Ext: no cyanosis clubbing, 2+ edema  Neuro: no new deficits    Skin: No rashes  Psych: Normal affect and demeanor, alert and oriented x3    Data Reviewed:  I have personally reviewed following labs and imaging studies  Micro Results Recent Results (from the past 240 hour(s))  Urine culture     Status: None   Collection Time: 05/16/15  9:10 PM  Result Value Ref Range Status   Specimen Description URINE, CLEAN CATCH  Final   Special Requests NONE  Final   Culture   Final    >=100,000 COLONIES/mL ENTEROCOCCUS SPECIES Performed at [facility]    Report Status 05/19/2015 FINAL  Final   Organism ID, Bacteria ENTEROCOCCUS SPECIES  Final      Susceptibility   Enterococcus species - MIC*    AMPICILLIN <=2 SENSITIVE Sensitive     LEVOFLOXACIN >=8 RESISTANT Resistant     NITROFURANTOIN <=16 SENSITIVE Sensitive     VANCOMYCIN 1 SENSITIVE Sensitive     * >=100,000 COLONIES/mL ENTEROCOCCUS SPECIES    Radiology Reports Ct Abdomen Pelvis Wo Contrast  05/20/2015  CLINICAL DATA:  Low back pain, right lower quadrant and pelvic pain, generalized weakness EXAM: CT ABDOMEN AND  PELVIS WITHOUT CONTRAST TECHNIQUE: Multidetector CT imaging of the abdomen and pelvis was performed following the standard protocol without IV contrast. COMPARISON:  05/16/2015 FINDINGS: Small bilateral pleural effusions with basilar atelectasis and infiltration, greater on the right. This is progressing since previous study. Mild cardiac enlargement. Small esophageal hiatal hernia. Cholelithiasis with multiple stones in the gallbladder. No bile duct dilatation. The unenhanced appearance of the liver, spleen, gallbladder, inferior vena cava, and retroperitoneal lymph nodes is unremarkable. Calcification of the abdominal aorta and branch vessels. No aneurysm. Multiple exophytic lesions arising from both kidneys. Characterization is limited on noncontrast imaging but these are likely to represent cysts. No hydronephrosis. Mild infiltration around the kidneys may indicate infection or pyelonephritis. Circumscribed lesion in the tail of the pancreas measuring 1.7 cm diameter. No change since previous studies. Stomach, small bowel, and colon are not abnormally distended. No free air or free fluid in the abdomen. Pelvis: Increased density of the bladder. A noncontrast scan this could indicate diffuse hemorrhage. Bladder wall thickening is present with small amount of residual gas corresponding to known emphysematous cystitis. Uterus and ovaries are not enlarged. No free or loculated pelvic fluid collections. No pelvic mass or lymphadenopathy. Appendix is normal. Scattered diverticula in the sigmoid colon without evidence of diverticulitis. Degenerative changes in the spine and hips. Postoperative changes with internal fixation of the right proximal femur. No destructive bone lesions. IMPRESSION: Persistent finding consistent with emphysematous cystitis. New increased density of the urine in the bladder suggest hemorrhage. Infiltration around the kidneys may indicate pyelonephritis. Cholelithiasis. 1.7 cm diameter cystic  lesion in the tail of the pancreas. Small bilateral pleural effusions and basilar consolidation, greater on the right, and demonstrating progression since previous study. Electronically Signed   By: Oren Beckmann.D.  On: 05/20/2015 03:23   Ct Abdomen Pelvis Wo Contrast  05/16/2015  CLINICAL DATA:  Belching and constipation. EXAM: CT ABDOMEN AND PELVIS WITHOUT CONTRAST TECHNIQUE: Multidetector CT imaging of the abdomen and pelvis was performed following the standard protocol without IV contrast. COMPARISON:  05/06/2015 FINDINGS: Lower chest and abdominal wall: There is new septal thickening and pleural fluid at the bases. Hepatobiliary: No focal liver abnormality.Cholelithiasis without signs of cholecystitis. Calcification near the pancreatic head is likely in the GDA or pancreas. No evidence of calcified choledocholithiasis. Pancreas: Atrophy with 15 mm pancreatic tail lesion. This appeared cystic on recent enhanced imaging. Spleen: Unremarkable. Adrenals/Urinary Tract:  Negative adrenals. Persistent asymmetric right perinephric edema without hydronephrosis. There are bilateral renal cysts and patchy right renal cortical scarring. No hydronephrosis or ureteral calculus. Persistent pericystic haziness and mural gas at the apex. Reproductive:Negative Stomach/Bowel:  No obstruction. No inflammation. Vascular/Lymphatic: No acute vascular abnormality. No mass or adenopathy. Peritoneal: No ascites or pneumoperitoneum. Musculoskeletal: Degenerative changes and levoscoliosis. Remote intertrochanteric right femur fracture with repair and healing. IMPRESSION: 1. Emphysematous cystitis. Persistent asymmetric right perinephric edema which could reflect pyelonephritis. 2. CHF. 3. Cholelithiasis. 4. 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 Reference CT report 03/06/2015 for follow-up recommendations. Electronically Signed   By: Monte Fantasia M.D.   On: 05/16/2015 21:59   Ct Abdomen Pelvis W Contrast  05/06/2015  CLINICAL DATA:   Nausea, vomiting and diarrhea. Patient fell this morning with decreased blood pressure. History of diabetes and hypertension. EXAM: CT ABDOMEN AND PELVIS WITH CONTRAST TECHNIQUE: Multidetector CT imaging of the abdomen and pelvis was performed using the standard protocol following bolus administration of intravenous contrast. CONTRAST:  17mL OMNIPAQUE IOHEXOL 300 MG/ML  SOLN COMPARISON:  CT 03/06/2015. FINDINGS: Lower chest: Stable atelectasis or scarring in both lung bases. Cardiomegaly and coronary artery calcifications are noted. There is no pleural or pericardial effusion. Hepatobiliary: The liver has a stable appearance without focal abnormality. Multiple small calcified gallstones are again noted. There is no gallbladder wall thickening or significant biliary dilatation. Pancreas: The pancreas appears unchanged with diffuse atrophy and multiple small cystic lesions, measuring up to 1.7 cm in the pancreatic tail on image number 27. No surrounding inflammatory change or pancreatic ductal dilatation. Spleen: Normal in size without focal abnormality. Adrenals/Urinary Tract: The adrenal glands appear stable without suspicious findings. No renal cortical scarring and atrophy are again noted, right-greater-than-left. There are several low-density renal lesions which are grossly stable. There is new mild dilatation of the right renal pelvis with associated wall thickening and mildly delayed contrast excretion. There is also new asymmetric perinephric soft tissue stranding on the right. No ureteral dilatation or urinary tract calculus is seen. There is new air within the urinary bladder lumen. Some of this air may be contained within the bladder wall. No air is seen within the kidneys or ureters. There are no inflammatory changes surrounding the bladder. Stomach/Bowel: No evidence of bowel wall thickening, distention or surrounding inflammatory change. The appendix appears normal. There is mild distal colonic  diverticulosis. Vascular/Lymphatic: There are no enlarged abdominal or pelvic lymph nodes. There is diffuse atherosclerosis of the aorta, its branches and the iliac arteries. Reproductive: Unremarkable.  No evidence of adnexal mass. Other: No generalized ascites or free air. The anterior abdominal wall appears unremarkable. Musculoskeletal: No acute or significant osseous findings. There are stable degenerative changes throughout the spine associated with a mild convex left scoliosis. Previous right proximal femoral ORIF. Chronic T11 compression deformity appears unchanged. IMPRESSION: 1. Interval change in the  appearance of the right kidney with moderate perinephric soft tissue stranding, pelvic wall thickening and delayed contrast excretion. No evidence of obstructing calculus. These findings could be secondary to a recently passed calculus, obstruction from previously suspected papillary necrosis or pyelonephritis. 2. Air within the urinary bladder lumen and possibly the right bladder wall. Emphysematous cystitis should be considered in this diabetic if bladder instrumentation has not been recently performed. Correlation with urine analysis recommended. 3. Cholelithiasis without evidence of cholecystitis or biliary dilatation. 4. 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 Please refer to follow up recommendations issued 2 months ago. Electronically Signed   By: Richardean Sale M.D.   On: 05/06/2015 16:57   Dg Chest Port 1 View  05/20/2015  CLINICAL DATA:  Dyspnea EXAM: PORTABLE CHEST 1 VIEW COMPARISON:  05/16/2015 FINDINGS: There is moderate cardiomegaly. There is vascular and interstitial prominence. There is perihilar ground-glass opacity bilaterally. These findings have worsened. There is a new right pleural effusion, moderately large. IMPRESSION: Worsening congestive heart failure. Electronically Signed   By: Andreas Newport M.D.   On: 05/20/2015 04:40   Dg Chest Port 1  View  05/16/2015  CLINICAL DATA:  Weakness and chest pain all day EXAM: PORTABLE CHEST 1 VIEW COMPARISON:  May 07, 2015 FINDINGS: The heart size and mediastinal contours are stable. The heart size is enlarged. There is pulmonary edema. There is no focal pneumonia or pleural effusion. There is mild atelectasis of both lung bases. The visualized skeletal structures are unremarkable. IMPRESSION: Congestive heart failure. Electronically Signed   By: Abelardo Diesel M.D.   On: 05/16/2015 21:02   Dg Chest Port 1 View  05/07/2015  CLINICAL DATA:  Sepsis, UTI, abdominal pain EXAM: PORTABLE CHEST 1 VIEW COMPARISON:  08/23/2014 and 11/21/2012 FINDINGS: Cardiomegaly again noted. Chronic elevation of the right hemidiaphragm. Central mild vascular congestion and mild interstitial prominence bilateral without convincing pulmonary edema. Stable old fracture of the right sixth rib. No segmental infiltrate. Mild basilar atelectasis. Suboptimal study due to patient's large body habitus IMPRESSION: Central mild vascular congestion and mild interstitial prominence bilaterally without convincing pulmonary edema. Suboptimal study due to patient's large body habitus. Chronic elevation of the right hemidiaphragm. No segmental infiltrate. Mild basilar atelectasis. Electronically Signed   By: Lahoma Crocker M.D.   On: 05/07/2015 07:55    CBC  Recent Labs Lab 05/16/15 1925 05/17/15 0708 05/17/15 1448 05/17/15 1832 05/19/15 0518 05/20/15 0110  WBC 15.5* 18.2* 17.7*  --  15.2* 15.5*  HGB 11.0* 9.8* 9.5*  --  9.0* 9.3*  HCT 34.5* 30.6* 29.7*  --  28.4* 28.4*  PLT 458* 466* 426* 440* 417* 413*  MCV 93.2 91.3 91.7  --  92.2 92.2  MCH 29.7 29.3 29.3  --  29.2 30.2  MCHC 31.9 32.0 32.0  --  31.7 32.7  RDW 14.0 14.1 14.2  --  14.4 14.4  LYMPHSABS 2.3 1.6  --   --   --  1.3  MONOABS 0.8 1.5*  --   --   --  1.7*  EOSABS 0.3 0.0  --   --   --  0.1  BASOSABS 0.0 0.0  --   --   --  0.0    Chemistries   Recent Labs Lab  05/16/15 1925 05/17/15 0708 05/17/15 1448 05/18/15 0247 05/19/15 0518 05/20/15 0110  NA 136 137  --  137 135 136  K 3.2* 4.3  --  4.0 4.0 4.4  CL 96* 96*  --  95*  96* 95*  CO2 27 30  --  30 28 31   GLUCOSE 166* 198*  --  237* 206* 156*  BUN 11 9  --  10 11 13   CREATININE 0.96 1.13* 1.21* 1.26* 1.30* 1.32*  CALCIUM 8.2* 8.4*  --  8.4* 8.5* 8.8*  AST 163* 142*  --   --   --  39  ALT 22 21  --   --   --  18  ALKPHOS 101 103  --   --   --  112  BILITOT 0.5 0.8  --   --   --  0.5   ------------------------------------------------------------------------------------------------------------------ estimated creatinine clearance is 48 mL/min (by C-G formula based on Cr of 1.32). ------------------------------------------------------------------------------------------------------------------  Recent Labs  05/19/15 0518  HGBA1C 7.0*   ------------------------------------------------------------------------------------------------------------------ No results for input(s): CHOL, HDL, LDLCALC, TRIG, CHOLHDL, LDLDIRECT in the last 72 hours. ------------------------------------------------------------------------------------------------------------------ No results for input(s): TSH, T4TOTAL, T3FREE, THYROIDAB in the last 72 hours.  Invalid input(s): FREET3 ------------------------------------------------------------------------------------------------------------------ No results for input(s): VITAMINB12, FOLATE, FERRITIN, TIBC, IRON, RETICCTPCT in the last 72 hours.  Coagulation profile  Recent Labs Lab 05/16/15 1925 05/18/15 0247 05/19/15 0518 05/20/15 0110  INR 1.54* 1.87* 1.96* 2.07*    No results for input(s): DDIMER in the last 72 hours.  Cardiac Enzymes  Recent Labs Lab 05/17/15 1448 05/20/15 0110 05/20/15 0610  TROPONINI 48.33* 15.66* 13.12*    ------------------------------------------------------------------------------------------------------------------ Invalid input(s): POCBNP   Recent Labs  05/19/15 1139 05/19/15 1711 05/19/15 2120 05/20/15 0054 05/20/15 0754 05/20/15 1121  GLUCAP 287* 293* 261* 151* 185* 212*     RAI,RIPUDEEP M.D. [facility] 05/20/2015, 12:03 PM  Pager: [phone] Between 7am to 7pm - call Pager - [phone]  After 7pm go to [email or web address] - password [email or web address]  Call night coverage person covering after 7pm

## 2015-05-20 NOTE — Progress Notes (Signed)
RN called it to room pt c/o of SOB. Pt's lung sounds have changed to wheezing on the rt side. PA on call notified & New orders placed for albuterol nebs; stat portable cxr.  MD Hal Hope paged about CT of abd results. New orders for UA & the pt is to be started on abx.

## 2015-05-20 NOTE — Progress Notes (Signed)
Patient Name: [name] [name] Date of Encounter: 05/20/2015  Hospital Problem List     Principal Problem:   Non-ST elevation MI (NSTEMI) [facility]) Active Problems:   CAD (coronary artery disease)   Ischemic cardiomyopathy   Acute systolic CHF (congestive heart failure) (HCC)   DM (diabetes mellitus) ([place])   Type 2 diabetes mellitus with vascular disease ([place])   Hypertensive heart disease   PAF (paroxysmal atrial fibrillation) (HCC)   Hyperlipidemia   Seizure disorder ([place])   Anticoagulated on Coumadin   Subjective   Has been having lower ext edema over the past few days.  More dyspneic last night and treated with IV lasix x 1 with some relief. Ambulated this AM - wore O2 - tired, dyspneic afterward.  Currently mildly nauseated - "spit up lunch."  Inpatient Medications    . ampicillin (OMNIPEN) IV  500 mg Intravenous 4 times per day  . aspirin  81 mg Oral Daily  . atorvastatin  40 mg Oral QHS  . bisacodyl  10 mg Rectal Once  . calcium-vitamin D  1 tablet Oral Q breakfast  . clopidogrel  75 mg Oral Daily  . escitalopram  20 mg Oral Daily  . furosemide  40 mg Oral Daily  . insulin aspart  0-5 Units Subcutaneous QHS  . insulin aspart  0-9 Units Subcutaneous TID WC  . insulin aspart  3 Units Subcutaneous TID WC  . insulin glargine  20 Units Subcutaneous Daily  . lisinopril  2.5 mg Oral Daily  . metoprolol tartrate  100 mg Oral BID  . phenytoin  300 mg Oral Daily  . polyethylene glycol  17 g Oral Daily  . sodium chloride flush  3 mL Intravenous Q12H  . warfarin  1.25 mg Oral q1800  . Warfarin - Pharmacist Dosing Inpatient   Does not apply q1800    Vital Signs    Filed Vitals:   05/20/15 0040 05/20/15 0335 05/20/15 0410 05/20/15 0540  BP: 139/76  165/72 142/52  Pulse:   65 62  Temp: 97.9 F (36.6 C)     TempSrc: Oral     Resp: 20  24 17   Height:      Weight:      SpO2: 92% 95% 99% 99%    Intake/Output Summary (Last 24 hours) at 05/20/15 1255 Last data filed at  05/20/15 0800  Gross per 24 hour  Intake    600 ml  Output    500 ml  Net    100 ml   Filed Weights   05/17/15 0003 05/18/15 0415 05/19/15 0455  Weight: 225 lb 12.8 oz (102.422 kg) 227 lb 15.3 oz (103.4 kg) 222 lb 8 oz (100.925 kg)    Physical Exam    General: Pleasant, NAD. Neuro: Alert and oriented X 3. Moves all extremities spontaneously. Psych: Flat affect. HEENT:  Normal  Neck: Supple without bruits.  JVP ~ 12 cm. Lungs:  Resp regular and unlabored, bibasilar crackles. Heart: RRR no s3, s4, or murmurs. Abdomen: Soft, mild ruq tenderness, non-distended, BS + x 4.  Extremities: No clubbing, cyanosis.  1+ bilat ankle edema. DP/PT/Radials 1+ and equal bilaterally.  Labs    CBC  Recent Labs  05/19/15 0518 05/20/15 0110  WBC 15.2* 15.5*  NEUTROABS  --  12.4*  HGB 9.0* 9.3*  HCT 28.4* 28.4*  MCV 92.2 92.2  PLT 417* 123XX123*   Basic Metabolic Panel  Recent Labs  05/19/15 0518 05/20/15 0110  NA 135 136  K  4.0 4.4  CL 96* 95*  CO2 28 31  GLUCOSE 206* 156*  BUN 11 13  CREATININE 1.30* 1.32*  CALCIUM 8.5* 8.8*   Liver Function Tests  Recent Labs  05/20/15 0110  AST 39  ALT 18  ALKPHOS 112  BILITOT 0.5  PROT 6.7  ALBUMIN 2.6*    Recent Labs  05/20/15 0110  LIPASE 15   Cardiac Enzymes  Recent Labs  05/17/15 1448 05/20/15 0110 05/20/15 0610  TROPONINI 48.33* 15.66* 13.12*   Hemoglobin A1C  Recent Labs  05/19/15 0518  HGBA1C 7.0*   Lab Results  Component Value Date   INR 2.07* 05/20/2015   INR 1.96* 05/19/2015   INR 1.87* 05/18/2015   PROTIME 19.0 07/26/2008     Telemetry    RSR, 60's.  Radiology    Ct Abdomen Pelvis Wo Contrast  05/20/2015  CLINICAL DATA:  Low back pain, right lower quadrant and pelvic pain, generalized weakness EXAM: CT ABDOMEN AND PELVIS WITHOUT CONTRAST TECHNIQUE: Multidetector CT imaging of the abdomen and pelvis was performed following the standard protocol without IV contrast. COMPARISON:  05/16/2015  FINDINGS: Small bilateral pleural effusions with basilar atelectasis and infiltration, greater on the right. This is progressing since previous study. Mild cardiac enlargement. Small esophageal hiatal hernia. Cholelithiasis with multiple stones in the gallbladder. No bile duct dilatation. The unenhanced appearance of the liver, spleen, gallbladder, inferior vena cava, and retroperitoneal lymph nodes is unremarkable. Calcification of the abdominal aorta and branch vessels. No aneurysm. Multiple exophytic lesions arising from both kidneys. Characterization is limited on noncontrast imaging but these are likely to represent cysts. No hydronephrosis. Mild infiltration around the kidneys may indicate infection or pyelonephritis. Circumscribed lesion in the tail of the pancreas measuring 1.7 cm diameter. No change since previous studies. Stomach, small bowel, and colon are not abnormally distended. No free air or free fluid in the abdomen. Pelvis: Increased density of the bladder. A noncontrast scan this could indicate diffuse hemorrhage. Bladder wall thickening is present with small amount of residual gas corresponding to known emphysematous cystitis. Uterus and ovaries are not enlarged. No free or loculated pelvic fluid collections. No pelvic mass or lymphadenopathy. Appendix is normal. Scattered diverticula in the sigmoid colon without evidence of diverticulitis. Degenerative changes in the spine and hips. Postoperative changes with internal fixation of the right proximal femur. No destructive bone lesions. IMPRESSION: Persistent finding consistent with emphysematous cystitis. New increased density of the urine in the bladder suggest hemorrhage. Infiltration around the kidneys may indicate pyelonephritis. Cholelithiasis. 1.7 cm diameter cystic lesion in the tail of the pancreas. Small bilateral pleural effusions and basilar consolidation, greater on the right, and demonstrating progression since previous study.  Electronically Signed   By: Lucienne Capers M.D.   On: 05/20/2015 03:23   Ct Abdomen Pelvis Wo Contrast  05/16/2015  CLINICAL DATA:  Belching and constipation. EXAM: CT ABDOMEN AND PELVIS WITHOUT CONTRAST TECHNIQUE: Multidetector CT imaging of the abdomen and pelvis was performed following the standard protocol without IV contrast. COMPARISON:  05/06/2015 FINDINGS: Lower chest and abdominal wall: There is new septal thickening and pleural fluid at the bases. Hepatobiliary: No focal liver abnormality.Cholelithiasis without signs of cholecystitis. Calcification near the pancreatic head is likely in the GDA or pancreas. No evidence of calcified choledocholithiasis. Pancreas: Atrophy with 15 mm pancreatic tail lesion. This appeared cystic on recent enhanced imaging. Spleen: Unremarkable. Adrenals/Urinary Tract:  Negative adrenals. Persistent asymmetric right perinephric edema without hydronephrosis. There are bilateral renal cysts and patchy right renal  cortical scarring. No hydronephrosis or ureteral calculus. Persistent pericystic haziness and mural gas at the apex. Reproductive:Negative Stomach/Bowel:  No obstruction. No inflammation. Vascular/Lymphatic: No acute vascular abnormality. No mass or adenopathy. Peritoneal: No ascites or pneumoperitoneum. Musculoskeletal: Degenerative changes and levoscoliosis. Remote intertrochanteric right femur fracture with repair and healing. IMPRESSION: 1. Emphysematous cystitis. Persistent asymmetric right perinephric edema which could reflect pyelonephritis. 2. CHF. 3. Cholelithiasis. 4. 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 Reference CT report 03/06/2015 for follow-up recommendations. Electronically Signed   By: Monte Fantasia M.D.   On: 05/16/2015 21:59   Ct Abdomen Pelvis W Contrast  05/06/2015  CLINICAL DATA:  Nausea, vomiting and diarrhea. Patient fell this morning with decreased blood pressure. History of diabetes and hypertension. EXAM: CT ABDOMEN AND PELVIS WITH CONTRAST  TECHNIQUE: Multidetector CT imaging of the abdomen and pelvis was performed using the standard protocol following bolus administration of intravenous contrast. CONTRAST:  35mL OMNIPAQUE IOHEXOL 300 MG/ML  SOLN COMPARISON:  CT 03/06/2015. FINDINGS: Lower chest: Stable atelectasis or scarring in both lung bases. Cardiomegaly and coronary artery calcifications are noted. There is no pleural or pericardial effusion. Hepatobiliary: The liver has a stable appearance without focal abnormality. Multiple small calcified gallstones are again noted. There is no gallbladder wall thickening or significant biliary dilatation. Pancreas: The pancreas appears unchanged with diffuse atrophy and multiple small cystic lesions, measuring up to 1.7 cm in the pancreatic tail on image number 27. No surrounding inflammatory change or pancreatic ductal dilatation. Spleen: Normal in size without focal abnormality. Adrenals/Urinary Tract: The adrenal glands appear stable without suspicious findings. No renal cortical scarring and atrophy are again noted, right-greater-than-left. There are several low-density renal lesions which are grossly stable. There is new mild dilatation of the right renal pelvis with associated wall thickening and mildly delayed contrast excretion. There is also new asymmetric perinephric soft tissue stranding on the right. No ureteral dilatation or urinary tract calculus is seen. There is new air within the urinary bladder lumen. Some of this air may be contained within the bladder wall. No air is seen within the kidneys or ureters. There are no inflammatory changes surrounding the bladder. Stomach/Bowel: No evidence of bowel wall thickening, distention or surrounding inflammatory change. The appendix appears normal. There is mild distal colonic diverticulosis. Vascular/Lymphatic: There are no enlarged abdominal or pelvic lymph nodes. There is diffuse atherosclerosis of the aorta, its branches and the iliac arteries.  Reproductive: Unremarkable.  No evidence of adnexal mass. Other: No generalized ascites or free air. The anterior abdominal wall appears unremarkable. Musculoskeletal: No acute or significant osseous findings. There are stable degenerative changes throughout the spine associated with a mild convex left scoliosis. Previous right proximal femoral ORIF. Chronic T11 compression deformity appears unchanged. IMPRESSION: 1. Interval change in the appearance of the right kidney with moderate perinephric soft tissue stranding, pelvic wall thickening and delayed contrast excretion. No evidence of obstructing calculus. These findings could be secondary to a recently passed calculus, obstruction from previously suspected papillary necrosis or pyelonephritis. 2. Air within the urinary bladder lumen and possibly the right bladder wall. Emphysematous cystitis should be considered in this diabetic if bladder instrumentation has not been recently performed. Correlation with urine analysis recommended. 3. Cholelithiasis without evidence of cholecystitis or biliary dilatation. 4. 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 Please refer to follow up recommendations issued 2 months ago. Electronically Signed   By: Richardean Sale M.D.   On: 05/06/2015 16:57   Dg Chest Port 1 View  05/20/2015  CLINICAL  DATA:  Dyspnea EXAM: PORTABLE CHEST 1 VIEW COMPARISON:  05/16/2015 FINDINGS: There is moderate cardiomegaly. There is vascular and interstitial prominence. There is perihilar ground-glass opacity bilaterally. These findings have worsened. There is a new right pleural effusion, moderately large. IMPRESSION: Worsening congestive heart failure. Electronically Signed   By: Andreas Newport M.D.   On: 05/20/2015 04:40   Dg Chest Port 1 View  05/16/2015  CLINICAL DATA:  Weakness and chest pain all day EXAM: PORTABLE CHEST 1 VIEW COMPARISON:  May 07, 2015 FINDINGS: The heart size and mediastinal contours are  stable. The heart size is enlarged. There is pulmonary edema. There is no focal pneumonia or pleural effusion. There is mild atelectasis of both lung bases. The visualized skeletal structures are unremarkable. IMPRESSION: Congestive heart failure. Electronically Signed   By: Abelardo Diesel M.D.   On: 05/16/2015 21:02   Dg Chest Port 1 View  05/07/2015  CLINICAL DATA:  Sepsis, UTI, abdominal pain EXAM: PORTABLE CHEST 1 VIEW COMPARISON:  08/23/2014 and 11/21/2012 FINDINGS: Cardiomegaly again noted. Chronic elevation of the right hemidiaphragm. Central mild vascular congestion and mild interstitial prominence bilateral without convincing pulmonary edema. Stable old fracture of the right sixth rib. No segmental infiltrate. Mild basilar atelectasis. Suboptimal study due to patient's large body habitus IMPRESSION: Central mild vascular congestion and mild interstitial prominence bilaterally without convincing pulmonary edema. Suboptimal study due to patient's large body habitus. Chronic elevation of the right hemidiaphragm. No segmental infiltrate. Mild basilar atelectasis. Electronically Signed   By: Lahoma Crocker M.D.   On: 05/07/2015 07:55    Assessment & Plan    1.  NSTEMI/CAD:  Admitted 3/17 with multiple complaints and markedly elevated troponin.  S/P PTCA of the OM1 on 3/17.  She has been doing well post-pci w/o recurrent c/p.  Cont asa, statin, plavix,  blocker, acei.  Plan to d/c ASA after one month.  Cardiac rehab has seen.  2.  PAF:  Maintaining sinus on  blocker.  INR Rx.  3.  Acute systolic CHF/ICM:  Pt with worsening dyspnea last night and treated with IV lasix.  She was placed back on PO lasix on 3/19.  EF known to be reduced @ 35-40% by echo on 3/10, 35-45% by V gram on 3/17.  Net positive of 458 for this admission.  Wt was down yesterday - 222 - f/u today.  She does have evidence of volume overload on exam.  Cont  blocker and acei.  Push lasix to 40 bid.  F/u renal fxn in AM as creat is mildly  elevated today.  4.  Hypertensive Heart Disease:  BP relatively stable on  blocker and acei.  5.  HL:  LDL 98 in 12/2013. LFT's wnl.  In setting of ACS, advance lipitor to 80 mg.  6.  Type II DM:  Per IM.  7.  Enterococcus UTI/Pyelonephritis:  Abx per IM.  8.  Sz D/O:  Dilantin per IM.  Signed, Murray Hodgkins NP

## 2015-05-20 NOTE — Consult Note (Signed)
Subjective: CC: dysuria.  Hx:  I was asked to see Mr. Wellman in consultation by Dr. Tana Coast for a recurrent UTI with pyelonephritis and air in the bladder.   She has been seen by Dr. Alyson Ingles in our office for UTI's and was being treated for an e. Coli pyelonephritis.   She is admitted now for cardiac issues but began to have voiding symptoms again and was found to have an enterococcus on culture and a repeat CT showed right pyelonephritis with air in the bladder.   She has had air in her bladder on several recent CT's.  The urine was also increased density and it appears that she has a chronically elevated PVR.  Prior to my arrival to the room today she was having marked frequency and urgency with abdominal pain and nausea with vomiting.  She had a foley placed at my request with return on 573ml of clear urine.   She had been given phenergan just prior to my arrival and is heavily sedated.  ROS:  Review of Systems  Unable to perform ROS: mental acuity    Allergies  Allergen Reactions  . Procaine Hcl Anaphylaxis    Past Medical History  Diagnosis Date  . Blindness of left eye   . Eye muscle weakness     Right eye weakness after cataract surgery  . Common migraine     ?hx of  . DIABETES MELLITUS, TYPE II 03/27/2006    dr [name]  . HYPERLIPIDEMIA 03/27/2006  . 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 HYPERTENSION 03/27/2006  . Atrial fibrillation ([place])     SVT dx 2007, cath 2007 mild CAD, then had an cardioversion, ablation; still on coumadin , has occ palpitation, EKG 03-2010 NSR  . Headache(784.0) 05/07/2009  . LUNG NODULE 09/01/2006    Excision, Bx [place]  . Osteopenia 2004    Dexa 2004 showed Osteopenia, DEXA 03/2007 normal  . Seizures ([place])   . Intertrochanteric fracture of right hip ([place]) 07/13/2012  . 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 GERD (gastroesophageal reflux disease) 10/05/2011  . Recurrent urinary tract infection     Seeing Urology  . 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[place]) [id number]    Past  Surgical History  Procedure Laterality Date  . Shoulder surgery    . Femur im nail Right 07/15/2012    Procedure: INTRAMEDULLARY (IM) NAIL HIP;  Surgeon: Johnny Bridge, MD;  Location: [facility];  Service: Orthopedics;  Laterality: Right;  . Cardiac catheterization N/A 05/17/2015    Procedure: Left Heart Cath and Coronary Angiography;  Surgeon: Jettie Booze, MD;  Location: [facility];  Service: Cardiovascular;  Laterality: N/A;  . Cardiac catheterization N/A 05/17/2015    Procedure: Coronary Balloon Angioplasty;  Surgeon: Jettie Booze, MD;  Location: [facility];  Service: Cardiovascular;  Laterality: N/A;    Social History   Social History  . Marital Status: Married    Spouse Name: Jenny Reichmann  . Number of Children: 1  . Years of Education: College   Occupational History  . retired     Social History Main Topics  . Smoking status: Former Smoker    Types: Cigarettes    Quit date: 03/02/1978  . Smokeless tobacco: Never Used  . Alcohol Use: No  . Drug Use: No  . Sexual Activity: No   Other Topics Concern  . Not on file   Social History Narrative   Patient lives at home spouse.   Moved from [place] 2004   1  son, problems w/ drugs, at home         Family History  Problem Relation Age of Onset  . Diabetes Father   . Heart attack Father 62  . Diabetes Sister   . Cancer Neg Hx     no hx of colon or breast cancer    Anti-infectives: Anti-infectives    Start     Dose/Rate Route Frequency Ordered Stop   05/20/15 0415  ampicillin (OMNIPEN) 500 mg in sodium chloride 0.9 % 50 mL IVPB     500 mg 150 mL/hr over 20 Minutes Intravenous 4 times per day 05/20/15 0407     05/19/15 1400  amoxicillin (AMOXIL) capsule 500 mg  Status:  Discontinued     500 mg Oral 3 times per day 05/19/15 1159 05/20/15 0407   05/17/15 0130  cephALEXin (KEFLEX) capsule 500 mg  Status:  Discontinued     500 mg Oral 3 times daily before meals & bedtime 05/17/15 0053 05/19/15 1159    05/16/15 2145  cefTRIAXone (ROCEPHIN) 1 g in dextrose 5 % 50 mL IVPB     1 g 100 mL/hr over 30 Minutes Intravenous  Once 05/16/15 2139 05/16/15 2301      Current Facility-Administered Medications  Medication Dose Route Frequency Provider Last Rate Last Dose  . 0.9 %  sodium chloride infusion  250 mL Intravenous PRN Jettie Booze, MD 10 mL/hr at 05/17/15 1725 250 mL at 05/17/15 1725  . acetaminophen (TYLENOL) tablet 650 mg  650 mg Oral Q6H PRN Rise Patience, MD       Or  . acetaminophen (TYLENOL) suppository 650 mg  650 mg Rectal Q6H PRN Rise Patience, MD      . acetaminophen (TYLENOL) tablet 650 mg  650 mg Oral Q4H PRN Jettie Booze, MD   650 mg at 05/17/15 1515  . albuterol (PROVENTIL) (2.5 MG/3ML) 0.083% nebulizer solution 2.5 mg  2.5 mg Nebulization Q6H PRN Hewitt Shorts [place], PA-C   2.5 mg at 05/20/15 0415  . ampicillin (OMNIPEN) 500 mg in sodium chloride 0.9 % 50 mL IVPB  500 mg Intravenous 4 times per day Rise Patience, MD   500 mg at 05/20/15 1243  . aspirin chewable tablet 81 mg  81 mg Oral Daily Jettie Booze, MD   81 mg at 05/20/15 0950  . atorvastatin (LIPITOR) tablet 80 mg  80 mg Oral QHS Rogelia Mire, NP      . bisacodyl (DULCOLAX) suppository 10 mg  10 mg Rectal Once Ripudeep Krystal Eaton, MD   10 mg at 05/17/15 0953  . calcium-vitamin D (OSCAL WITH D) 500-200 MG-UNIT per tablet 1 tablet  1 tablet Oral Q breakfast Rise Patience, MD   1 tablet at 05/20/15 [phone]  . clopidogrel (PLAVIX) tablet 75 mg  75 mg Oral Daily Almyra Deforest, [place]   75 mg at 05/20/15 0950  . escitalopram (LEXAPRO) tablet 20 mg  20 mg Oral Daily Rise Patience, MD   20 mg at 05/20/15 0950  . furosemide (LASIX) tablet 40 mg  40 mg Oral BID Rogelia Mire, NP      . insulin aspart (novoLOG) injection 0-5 Units  0-5 Units Subcutaneous QHS Almyra Deforest, [place]   3 Units at 05/19/15 2143  . insulin aspart (novoLOG) injection 0-9 Units  0-9 Units Subcutaneous TID WC Almyra Deforest, PA   3  Units at 05/20/15 1242  . insulin aspart (novoLOG) injection 3 Units  3 Units  Subcutaneous TID WC Ripudeep Krystal Eaton, MD   3 Units at 05/20/15 1242  . insulin glargine (LANTUS) injection 20 Units  20 Units Subcutaneous Daily Ripudeep Krystal Eaton, MD   20 Units at 05/20/15 0950  . lisinopril (PRINIVIL,ZESTRIL) tablet 2.5 mg  2.5 mg Oral Daily Lelon Perla, MD   2.5 mg at 05/20/15 0950  . LORazepam (ATIVAN) tablet 0.25 mg  0.25 mg Oral Q8H PRN Almyra Deforest, PA   0.25 mg at 05/19/15 2335  . metoprolol (LOPRESSOR) tablet 100 mg  100 mg Oral BID Lelon Perla, MD   100 mg at 05/20/15 0950  . morphine 2 MG/ML injection 1 mg  1 mg Intravenous Q2H PRN Rise Patience, MD      . nitroGLYCERIN (NITROSTAT) SL tablet 0.4 mg  0.4 mg Sublingual Q5 min PRN Hewitt Shorts [place], PA-C   0.4 mg at 05/20/15 0101  . ondansetron (ZOFRAN) tablet 4 mg  4 mg Oral Q6H PRN Rise Patience, MD       Or  . ondansetron [facility]) injection 4 mg  4 mg Intravenous Q6H PRN Rise Patience, MD   4 mg at 05/20/15 1243  . phenytoin (DILANTIN) ER capsule 300 mg  300 mg Oral Daily Rise Patience, MD   300 mg at 05/20/15 0950  . polyethylene glycol (MIRALAX / GLYCOLAX) packet 17 g  17 g Oral Daily Ripudeep K Rai, MD   17 g at 05/20/15 0950  . promethazine (PHENERGAN) injection 12.5 mg  12.5 mg Intravenous Q6H PRN Ripudeep K Rai, MD      . sodium chloride flush (NS) 0.9 % injection 3 mL  3 mL Intravenous Q12H Jettie Booze, MD   3 mL at 05/20/15 0951  . sodium chloride flush (NS) 0.9 % injection 3 mL  3 mL Intravenous PRN Jettie Booze, MD      . warfarin (COUMADIN) tablet 1.25 mg  1.25 mg Oral q1800 Ripudeep K Rai, MD   1.25 mg at 05/19/15 1800  . Warfarin - Pharmacist Dosing Inpatient   Does not apply [place], [facility]       Past, family and social history reviewed.  Objective: Vital signs in last 24 hours: Temp:  [97.7 F (36.5 C)-97.9 F (36.6 C)] 97.9 F (36.6 C) (03/20 0040) Pulse Rate:  [62-68] 62  (03/20 0540) Resp:  [17-24] 17 (03/20 0540) BP: (139-165)/(52-76) 142/52 mmHg (03/20 0540) SpO2:  [92 %-99 %] 99 % (03/20 0540)  Intake/Output from previous day: 03/19 0701 - 03/20 0700 In: 843 [P.O.:840; I.V.:3] Out: 500 [Urine:500] Intake/Output this shift: Total I/O In: -  Out: 200 [Urine:200]   Physical Exam  Constitutional:  WD, WN in NAD but sedated and A/O x 0.   HENT:  Head: Normocephalic and atraumatic.  Neck: Normal range of motion. Neck supple. No thyromegaly present.  Cardiovascular: Normal rate and regular rhythm.   Pulmonary/Chest: Effort normal and breath sounds normal.  Abdominal: Soft. Bowel sounds are normal. She exhibits no mass. There is tenderness (RUQ). There is no guarding.  Musculoskeletal: Normal range of motion. She exhibits no edema or tenderness.  Lymphadenopathy:    She has no cervical adenopathy.  Neurological:  Unable to evaluate  Skin: Skin is warm and dry.  Psychiatric:  Unable to assess  Vitals reviewed.   Lab Results:   Recent Labs  05/19/15 0518 05/20/15 0110  WBC 15.2* 15.5*  HGB 9.0* 9.3*  HCT 28.4* 28.4*  PLT 417*  413*   BMET  Recent Labs  05/19/15 0518 05/20/15 0110  NA 135 136  K 4.0 4.4  CL 96* 95*  CO2 28 31  GLUCOSE 206* 156*  BUN 11 13  CREATININE 1.30* 1.32*  CALCIUM 8.5* 8.8*   PT/INR  Recent Labs  05/19/15 0518 05/20/15 0110  LABPROT 22.3* 23.2*  INR 1.96* 2.07*   ABG No results for input(s): PHART, HCO3 in the last 72 hours.  Invalid input(s): PCO2, PO2  Studies/Results: Ct Abdomen Pelvis Wo Contrast  05/20/2015  CLINICAL DATA:  Low back pain, right lower quadrant and pelvic pain, generalized weakness EXAM: CT ABDOMEN AND PELVIS WITHOUT CONTRAST TECHNIQUE: Multidetector CT imaging of the abdomen and pelvis was performed following the standard protocol without IV contrast. COMPARISON:  05/16/2015 FINDINGS: Small bilateral pleural effusions with basilar atelectasis and infiltration, greater on  the right. This is progressing since previous study. Mild cardiac enlargement. Small esophageal hiatal hernia. Cholelithiasis with multiple stones in the gallbladder. No bile duct dilatation. The unenhanced appearance of the liver, spleen, gallbladder, inferior vena cava, and retroperitoneal lymph nodes is unremarkable. Calcification of the abdominal aorta and branch vessels. No aneurysm. Multiple exophytic lesions arising from both kidneys. Characterization is limited on noncontrast imaging but these are likely to represent cysts. No hydronephrosis. Mild infiltration around the kidneys may indicate infection or pyelonephritis. Circumscribed lesion in the tail of the pancreas measuring 1.7 cm diameter. No change since previous studies. Stomach, small bowel, and colon are not abnormally distended. No free air or free fluid in the abdomen. Pelvis: Increased density of the bladder. A noncontrast scan this could indicate diffuse hemorrhage. Bladder wall thickening is present with small amount of residual gas corresponding to known emphysematous cystitis. Uterus and ovaries are not enlarged. No free or loculated pelvic fluid collections. No pelvic mass or lymphadenopathy. Appendix is normal. Scattered diverticula in the sigmoid colon without evidence of diverticulitis. Degenerative changes in the spine and hips. Postoperative changes with internal fixation of the right proximal femur. No destructive bone lesions. IMPRESSION: Persistent finding consistent with emphysematous cystitis. New increased density of the urine in the bladder suggest hemorrhage. Infiltration around the kidneys may indicate pyelonephritis. Cholelithiasis. 1.7 cm diameter cystic lesion in the tail of the pancreas. Small bilateral pleural effusions and basilar consolidation, greater on the right, and demonstrating progression since previous study. Electronically Signed   By: Lucienne Capers M.D.   On: 05/20/2015 03:23   Dg Chest Port 1  View  05/20/2015  CLINICAL DATA:  Dyspnea EXAM: PORTABLE CHEST 1 VIEW COMPARISON:  05/16/2015 FINDINGS: There is moderate cardiomegaly. There is vascular and interstitial prominence. There is perihilar ground-glass opacity bilaterally. These findings have worsened. There is a new right pleural effusion, moderately large. IMPRESSION: Worsening congestive heart failure. Electronically Signed   By: Andreas Newport M.D.   On: 05/20/2015 04:40    I have reviewed her CT films, labs and notes.   Assessment: Recurrent UTI's and pyelonephritis with chronic retention with air in the bladder.  Foley placed and I would leave that until she can be seen in the office in 1-2 weeks by Dr. Alyson Ingles.   Antibiotics per culture.     CC: Dr. Estill Cotta.     Jabori Henegar J 05/20/2015 [phone]

## 2015-05-20 NOTE — Progress Notes (Signed)
ANTICOAGULATION CONSULT NOTE  Pharmacy Consult:  Restart Coumadin Indication:  H/o afib  Allergies  Allergen Reactions  . Procaine Hcl Anaphylaxis    Patient Measurements: Height: 5\' 9"  (175.3 cm) Weight: 222 lb 8 oz (100.925 kg) IBW/kg (Calculated) : 66.2   Vital Signs: Temp: 97.9 F (36.6 C) (03/20 0040) Temp Source: Oral (03/20 0040) BP: 142/52 mmHg (03/20 0540) Pulse Rate: 62 (03/20 0540)  Labs:  Recent Labs  05/17/15 1448 05/17/15 1832 05/18/15 0247 05/19/15 0518 05/20/15 0110 05/20/15 0610  HGB 9.5*  --   --  9.0* 9.3*  --   HCT 29.7*  --   --  28.4* 28.4*  --   PLT 426* 440*  --  417* 413*  --   LABPROT  --   --  21.5* 22.3* 23.2*  --   INR  --   --  1.87* 1.96* 2.07*  --   CREATININE 1.21*  --  1.26* 1.30* 1.32*  --   TROPONINI 48.33*  --   --   --  15.66* 13.12*    Estimated Creatinine Clearance: 48 mL/min (by C-G formula based on Cr of 1.32).  Assessment: 44 yof admitted 05/16/2015 with CP. She was on coumadin PTA for h/o afib.  Coumadin was held & started on IV heparin for NSTEMI.   Now s/p cardiac cath/balloon angioplasty, IV heparin dc'd and pharmacy consulted to  restart coumadin. Cardiology note plan asa81+ plavix +warf x1 month then stop asa continue plavix/warfarin. No bleeding noted.   INR 1.86 > 1.96> 2.07 after boost 2.5mg  and now back on home dose - continue  No bleeding CBC stable  PTA coumadin dose = 1.25mg  daily (last dose on 05/15/15)  Goal of Therapy:  INR = 2-3  Monitor platelets by anticoagulation protocol: Yes   Plan:  Continue home warfarin 1.25mg  daily Daily INR  Thank you Anette Guarneri, PharmD [phone] 05/20/2015 10:07 AM

## 2015-05-20 NOTE — Evaluation (Signed)
Physical Therapy Evaluation Patient Details Name: [name] [name] MRN: [id number] DOB: 05-07-1940 Today's Date: 05/20/2015   History of Present Illness  Pt is a 75 y/o F who presented w/ chest pain and SOB and workup revealed a-fib w/ nonspecific ST changes as well as acute systolic HF. Pt's PMH includes a-fib, seizure from encephalitis, DM2, blindness of Lt eye, osteopenia, Rt femur IM nail, shoulder surgery. She was admitted last week for sepsis secondary to pyelonephritis.    Clinical Impression  Pt admitted with above diagnosis. Pt currently with functional limitations due to the deficits listed below (see PT Problem List). [name] [name] presents w/ generalized weakness and is quick to fatigue w/ short distance ambulation using RW.  She lives in a 3 story home w/ her husband. Recommending SNF at d/c prior to returning home. Pt will benefit from skilled PT to increase their independence and safety with mobility to allow discharge to the venue listed below.      Follow Up Recommendations SNF;Supervision/Assistance - 24 hour    Equipment Recommendations  Rolling walker with 5" wheels    Recommendations for Other Services OT consult     Precautions / Restrictions Precautions Precautions: Fall Restrictions Weight Bearing Restrictions: No      Mobility  Bed Mobility               General bed mobility comments: Pt sitting in recliner chair upon PT arrival  Transfers Overall transfer level: Needs assistance Equipment used: Rolling walker (2 wheeled) Transfers: Sit to/from Stand Sit to Stand: Min assist         General transfer comment: Min assist to steady RW.  Cues for proper hand placement.  Ambulation/Gait Ambulation/Gait assistance: Min guard Ambulation Distance (Feet): 80 Feet Assistive device: Rolling walker (2 wheeled) Gait Pattern/deviations: Step-through pattern;Decreased stride length   Gait velocity interpretation: <1.8 ft/sec, indicative of risk for  recurrent falls General Gait Details: Pt fatigues quickly.  SpO2 down quickly to 86% on RA, PT required bump up to 2L O2 to maintain SpO2 in mid 90's.   Stairs            Wheelchair Mobility    Modified Rankin (Stroke Patients Only)       Balance Overall balance assessment: Needs assistance Sitting-balance support: No upper extremity supported;Feet supported Sitting balance-Leahy Scale: Good     Standing balance support: Bilateral upper extremity supported;During functional activity Standing balance-Leahy Scale: Poor Standing balance comment: Relies on RW for support                             Pertinent Vitals/Pain Pain Assessment: No/denies pain Pain Intervention(s): Limited activity within patient's tolerance;Monitored during session    [place] expects to be discharged to:: Private residence Living Arrangements: Spouse/significant other Available Help at Discharge: Family;Available 24 hours/day (although husband unable to provide physical assist) Type of Home: House Home Access: Level entry     Home Layout: Multi-level Home Equipment: Kasandra Knudsen - single point Additional Comments: 3 story house.  Enter from garage to tv room on main floor.  Up one flight w/ Rt rail to kitchen, dining room.  Up one more flight w/ Rt rail to bedroom.    Prior Function Level of Independence: Independent with assistive device(s)               Hand Dominance        Extremity/Trunk Assessment   Upper Extremity Assessment: Defer to  OT evaluation           Lower Extremity Assessment: LLE deficits/detail;RLE deficits/detail RLE Deficits / Details: strength grossly 4/5; pitting edema LLE Deficits / Details: strength grossly 4-/5  Cervical / Trunk Assessment: Kyphotic  Communication   Communication: No difficulties  Cognition Arousal/Alertness: Awake/alert Behavior During Therapy: WFL for tasks assessed/performed Overall Cognitive Status: No  family/caregiver present to determine baseline cognitive functioning Area of Impairment: Memory;Problem solving;Attention   Current Attention Level: Selective Memory: Decreased short-term memory       Problem Solving: Slow processing General Comments: Pt reports contradictory information in her PLOF.      General Comments      Exercises        Assessment/Plan    PT Assessment Patient needs continued PT services  PT Diagnosis Difficulty walking   PT Problem List Decreased strength;Decreased activity tolerance;Decreased balance;Decreased knowledge of use of DME;Decreased safety awareness;Decreased cognition;Cardiopulmonary status limiting activity  PT Treatment Interventions DME instruction;Gait training;Stair training;Functional mobility training;Therapeutic activities;Therapeutic exercise;Balance training;Cognitive remediation;Patient/family education   PT Goals (Current goals can be found in the Care Plan section) Acute Rehab PT Goals Patient Stated Goal: to feel better and go home PT Goal Formulation: With patient Time For Goal Achievement: 06/03/15 Potential to Achieve Goals: Good    Frequency Min 3X/week   Barriers to discharge Inaccessible home environment lives in a 3 story home    Co-evaluation               End of Session Equipment Utilized During Treatment: Gait belt Activity Tolerance: Patient limited by fatigue Patient left: in chair;with call bell/phone within reach;with chair alarm set Nurse Communication: Mobility status;Other (comment) (SpO2)         Time: 1005-1030 PT Time Calculation (min) (ACUTE ONLY): 25 min   Charges:   PT Evaluation $PT Eval Moderate Complexity: 1 Procedure PT Treatments $Gait Training: 8-22 mins   PT G Codes:       Collie Siad PT, DPT  Pager: [phone] Phone: [phone] 05/20/2015, 11:52 AM

## 2015-05-20 NOTE — Progress Notes (Signed)
Inpatient Diabetes Program Recommendations  AACE/ADA: New Consensus Statement on Inpatient Glycemic Control (2015)  Target Ranges:  Prepandial:   less than 140 mg/dL      Peak postprandial:   less than 180 mg/dL (1-2 hours)      Critically ill patients:  140 - 180 mg/dL  Results for [name] [name], [name] [name] (MRN [id number]) as of 05/20/2015 09:46  Ref. Range 05/19/2015 11:39 05/19/2015 17:11 05/19/2015 21:20 05/20/2015 00:54 05/20/2015 07:54  Glucose-Capillary Latest Ref Range: 65-99 mg/dL 287 (H) 293 (H) 261 (H) 151 (H) 185 (H)    Review of Glycemic Control  Diabetes history: DM2 Outpatient Diabetes medications: 70/30 insulin 50 units daily a.m. Current orders for Inpatient glycemic control: Lantus 20 untis daily + Novolog 3 units tid with meals + Novolog SSI 0-9 (sensitive scale) tid ac meals + Novolog 0-5 SSI @ hs  Inpatient Diabetes Program Recommendations:  Noted Lantus started and A1c pending. Will follow.  Thank you, Nani Gasser. Gudelia Eugene, RN, MSN, CDE Inpatient Glycemic Control Team Team Pager [phone] (8am-5pm) 05/20/2015 9:53 AM

## 2015-05-20 NOTE — Consult Note (Signed)
[facility] for Infectious Disease       Reason for Consult: pyelonephritis    Referring Physician: Dr. Tana Coast  Principal Problem:   Non-ST elevation MI (NSTEMI) [facility]) Active Problems:   Hyperlipidemia   Seizure disorder ([place])   DM (diabetes mellitus) ([place])   Type 2 diabetes mellitus with vascular disease ([place])   Hypertensive heart disease   CAD (coronary artery disease)   Ischemic cardiomyopathy   Acute systolic CHF (congestive heart failure) (HCC)   PAF (paroxysmal atrial fibrillation) (HCC)   Anticoagulated on Coumadin   . ampicillin (OMNIPEN) IV  500 mg Intravenous 4 times per day  . aspirin  81 mg Oral Daily  . atorvastatin  80 mg Oral QHS  . bisacodyl  10 mg Rectal Once  . calcium-vitamin D  1 tablet Oral Q breakfast  . clopidogrel  75 mg Oral Daily  . escitalopram  20 mg Oral Daily  . furosemide  40 mg Oral BID  . insulin aspart  0-5 Units Subcutaneous QHS  . insulin aspart  0-9 Units Subcutaneous TID WC  . insulin aspart  3 Units Subcutaneous TID WC  . insulin glargine  20 Units Subcutaneous Daily  . lisinopril  2.5 mg Oral Daily  . metoprolol tartrate  100 mg Oral BID  . phenytoin  300 mg Oral Daily  . polyethylene glycol  17 g Oral Daily  . sodium chloride flush  3 mL Intravenous Q12H  . warfarin  1.25 mg Oral q1800  . Warfarin - Pharmacist Dosing Inpatient   Does not apply q1800    Recommendations: Observe off of antibiotics Stop ampicillin  Assessment: She has positive urine culture for Enterococcus and CT findings of infiltration around kidney 'may indicate pyelonephritis".  Is otherwise asymptomatic making this c/w asymptomatic bacteruria which does not require treatment.    Antibiotics: Was on keflex  HPI: [name] [name] is a 75 y.o. female with DM, afib and recent hospitlization for pyelonephritis.  Her blood and urine grew E coli and she was sent out on antibiotics.  She came in now with chest pain and found an elevated troponin.  UA and  culture was sent for unclear indication and UA showed 6-30 WBCs, culture grew Enterococcus.  No fever, no chills. No dysuria, no frequency, no flank pain.  Previous hospitalization reviewed, pyelonephritis with e coli.    Review of Systems:  Constitutional: negative for fevers and chills Gastrointestinal: negative for nausea All other systems reviewed and are negative   Past Medical History  Diagnosis Date  . Blindness of left eye   . Eye muscle weakness     Right eye weakness after cataract surgery  . Common migraine     ?hx of  . DIABETES MELLITUS, TYPE II 03/27/2006    dr [name]  . HYPERLIPIDEMIA 03/27/2006  . 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 HYPERTENSION 03/27/2006  . Atrial fibrillation ([place])     SVT dx 2007, cath 2007 mild CAD, then had an cardioversion, ablation; still on coumadin , has occ palpitation, EKG 03-2010 NSR  . Headache(784.0) 05/07/2009  . LUNG NODULE 09/01/2006    Excision, Bx [place]  . Osteopenia 2004    Dexa 2004 showed Osteopenia, DEXA 03/2007 normal  . Seizures ([place])   . Intertrochanteric fracture of right hip ([place]) 07/13/2012  . 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 GERD (gastroesophageal reflux disease) 10/05/2011  . Recurrent urinary tract infection     Seeing Urology  . 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[place]) [id number]  Social History  Substance Use Topics  . Smoking status: Former Smoker    Types: Cigarettes    Quit date: 03/02/1978  . Smokeless tobacco: Never Used  . Alcohol Use: No    Family History  Problem Relation Age of Onset  . Diabetes Father   . Heart attack Father 30  . Diabetes Sister   . Cancer Neg Hx     no hx of colon or breast cancer    Allergies  Allergen Reactions  . Procaine Hcl Anaphylaxis    Physical Exam: Constitutional: in no apparent distress  Filed Vitals:   05/20/15 0540 05/20/15 1445  BP: 142/52 135/50  Pulse: 62 60  Temp:  98 F (36.7 C)  Resp: 17 16   EYES: anicteric ENMT:no thrush Cardiovascular: Cor irreg, irreg  RRR Respiratory: CTA B; normal respiratory effort GI: Bowel sounds are normal, liver is not enlarged, spleen is not enlarged Musculoskeletal: no pedal edema noted Skin: negatives: no rash Hematologic: no cervical lad  Lab Results  Component Value Date   WBC 15.5* 05/20/2015   HGB 9.3* 05/20/2015   HCT 28.4* 05/20/2015   MCV 92.2 05/20/2015   PLT 413* 05/20/2015    Lab Results  Component Value Date   CREATININE 1.32* 05/20/2015   BUN 13 05/20/2015   NA 136 05/20/2015   K 4.4 05/20/2015   CL 95* 05/20/2015   CO2 31 05/20/2015    Lab Results  Component Value Date   ALT 18 05/20/2015   AST 39 05/20/2015   ALKPHOS 112 05/20/2015     Microbiology: Recent Results (from the past 240 hour(s))  Urine culture     Status: None   Collection Time: 05/16/15  9:10 PM  Result Value Ref Range Status   Specimen Description URINE, CLEAN CATCH  Final   Special Requests NONE  Final   Culture   Final    >=100,000 COLONIES/mL ENTEROCOCCUS SPECIES Performed at [facility]    Report Status 05/19/2015 FINAL  Final   Organism ID, Bacteria ENTEROCOCCUS SPECIES  Final      Susceptibility   Enterococcus species - MIC*    AMPICILLIN <=2 SENSITIVE Sensitive     LEVOFLOXACIN >=8 RESISTANT Resistant     NITROFURANTOIN <=16 SENSITIVE Sensitive     VANCOMYCIN 1 SENSITIVE Sensitive     * >=100,000 COLONIES/mL ENTEROCOCCUS SPECIES    Ivorie Uplinger, [place] for Infectious Disease [facility] [email or web address] [phone] pager  [phone] cell 05/20/2015, 5:30 PM

## 2015-05-20 NOTE — Care Management Important Message (Signed)
Important Message  Patient Details  Name: LAI SAESEE MRN: [id number] Date of Birth: September 12, 1940   Medicare Important Message Given:  Yes    Barb Merino Kellogg 05/20/2015, 3:41 PM

## 2015-05-20 NOTE — Consult Note (Signed)
   [facility] CM Inpatient Consult   05/20/2015  [name] [name] 07/20/1940 [id number] Patient has been screened for [place] management services.  Patient is eligible for services with her Medicare benefits.  Admitted with NSTEMI, with a HX of HF. Current plan is for [facility] facility.  Will follow up for needs pending her post hospital follow up needs. Spoke with inpatient RNCM for any care management needs that maybe needed.  For questions, please contact: Natividad Brood, RN BSN [facility] Liaison  [phone] business mobile phone Toll free office [phone]

## 2015-05-20 NOTE — Progress Notes (Addendum)
RN called into room by pt. Pt c/o of feeling like she's about to pass out. Pt was up to chair RN took VS then put he pt back in the bed. Once back in the bed pt starts moaning & when asked what's wrong she says she's in pain but it was generalized & not in any particular area. PA on call notified new orders for EKG & SL nitro. Pt kept belching but denied nausea. Pt denied CP as well. Pt later c/o of pain in her legs then she stated the pain was in the bottom of her stomach & going up to her head making her mouth dry. At this point but 2 RNS as well as RRRN & the MD were at the bedside. Pt given 1 mg of IV morphine. New orders placed for pt. Bladder scan done by NT showed 36ml.Will continue to monitor the pt. Hoover Brunette, RN

## 2015-05-20 NOTE — Care Management Note (Addendum)
Case Management Note  Patient Details  Name: [name] [name] MRN: [id number] Date of Birth: [birth year]  Subjective/Objective:  Pt admitted for Nstemi- post cath. Continues on IV antibiotics and IV lasix. Pt was previously active with [facility] for HHRN/PT Services. Pt will need resumption orders once stable.                   Action/Plan: CM will continue to monitor for additional needs.    Expected Discharge Date:                  Expected Discharge Plan:  [place]  In-House Referral:     Discharge planning Services  CM Consult  Post Acute Care Choice:  Home Health, Resumption of Svcs/PTA Provider Choice offered to:  Patient  DME Arranged:    DME Agency:  [place] Arranged:  RN, PT [facility] Agency:  [place]  Status of Service:  Completed. Medicare Important Message Given:    Date Medicare IM Given:    Medicare IM give by:    Date Additional Medicare IM Given:    Additional Medicare Important Message give by:     If discussed at [place] of Stay Meetings, dates discussed:    Additional Comments: 1014 05-21-15 Jacqlyn Krauss, RN,BSN [phone] CM did speak with pt in regards to PT recommendations for SNF placement. Pt is refusing SNF at this time. Pt states husband is always at home with her. Pt has DME RW, [place] and 02. Husband to provide transportation home. [facility] aware that pt will be d/c today with RN/PT Services. [facility] to follow up in the community. No further needs from CM at this time.   Bethena Roys, RN 05/20/2015, 3:11 PM

## 2015-05-21 DIAGNOSIS — I4891 Unspecified atrial fibrillation: Secondary | ICD-10-CM

## 2015-05-21 DIAGNOSIS — I10 Essential (primary) hypertension: Secondary | ICD-10-CM

## 2015-05-21 DIAGNOSIS — Z5181 Encounter for therapeutic drug level monitoring: Secondary | ICD-10-CM

## 2015-05-21 DIAGNOSIS — N179 Acute kidney failure, unspecified: Secondary | ICD-10-CM

## 2015-05-21 DIAGNOSIS — Z794 Long term (current) use of insulin: Secondary | ICD-10-CM

## 2015-05-21 DIAGNOSIS — Z7901 Long term (current) use of anticoagulants: Secondary | ICD-10-CM

## 2015-05-21 DIAGNOSIS — E119 Type 2 diabetes mellitus without complications: Secondary | ICD-10-CM

## 2015-05-21 LAB — BASIC METABOLIC PANEL
Anion gap: 11 (ref 5–15)
BUN: 15 mg/dL (ref 6–20)
CALCIUM: 8.6 mg/dL — AB (ref 8.9–10.3)
CO2: 32 mmol/L (ref 22–32)
CREATININE: 1.34 mg/dL — AB (ref 0.44–1.00)
Chloride: 94 mmol/L — ABNORMAL LOW (ref 101–111)
GFR calc non Af Amer: 38 mL/min — ABNORMAL LOW (ref >60)
GFR, EST AFRICAN AMERICAN: 44 mL/min — AB (ref >60)
GLUCOSE: 106 mg/dL — AB (ref 65–99)
Potassium: 3.9 mmol/L (ref 3.5–5.1)
Sodium: 137 mmol/L (ref 135–145)

## 2015-05-21 LAB — CBC
HEMATOCRIT: 26.7 % — AB (ref 36.0–46.0)
Hemoglobin: 8.3 g/dL — ABNORMAL LOW (ref 12.0–15.0)
MCH: 29.1 pg (ref 26.0–34.0)
MCHC: 31.1 g/dL (ref 30.0–36.0)
MCV: 93.7 fL (ref 78.0–100.0)
Platelets: 380 10*3/uL (ref 150–400)
RBC: 2.85 MIL/uL — ABNORMAL LOW (ref 3.87–5.11)
RDW: 14.3 % (ref 11.5–15.5)
WBC: 10.3 10*3/uL (ref 4.0–10.5)

## 2015-05-21 LAB — PROTIME-INR
INR: 2.35 — AB (ref 0.00–1.49)
Prothrombin Time: 25.5 seconds — ABNORMAL HIGH (ref 11.6–15.2)

## 2015-05-21 LAB — URINE CULTURE

## 2015-05-21 LAB — GLUCOSE, CAPILLARY
Glucose-Capillary: 214 mg/dL — ABNORMAL HIGH (ref 65–99)
Glucose-Capillary: 176 mg/dL — ABNORMAL HIGH (ref 65–99)

## 2015-05-21 MED ORDER — METOPROLOL TARTRATE 100 MG PO TABS: 100.0000 mg | ORAL_TABLET | Freq: Two times a day (BID) | ORAL | Status: DC

## 2015-05-21 MED ORDER — ATORVASTATIN CALCIUM 40 MG PO TABS: 80.0000 mg | ORAL_TABLET | Freq: Every day | ORAL | Status: DC

## 2015-05-21 MED ORDER — LORAZEPAM 0.5 MG PO TABS: 0.2500 mg | ORAL_TABLET | Freq: Three times a day (TID) | ORAL | Status: DC | PRN

## 2015-05-21 MED ORDER — LISINOPRIL 2.5 MG PO TABS: 2.5000 mg | ORAL_TABLET | Freq: Every day | ORAL | Status: DC

## 2015-05-21 MED ORDER — NITROGLYCERIN 0.4 MG SL SUBL: 0.4000 mg | SUBLINGUAL_TABLET | SUBLINGUAL | Status: DC | PRN

## 2015-05-21 MED ORDER — PROMETHAZINE HCL 12.5 MG PO TABS: 12.5000 mg | ORAL_TABLET | Freq: Four times a day (QID) | ORAL | Status: DC | PRN

## 2015-05-21 MED ORDER — FUROSEMIDE 40 MG PO TABS: 40.0000 mg | ORAL_TABLET | Freq: Two times a day (BID) | ORAL | Status: DC

## 2015-05-21 MED ORDER — CLOPIDOGREL BISULFATE 75 MG PO TABS: 75.0000 mg | ORAL_TABLET | Freq: Every day | ORAL | Status: DC

## 2015-05-21 MED ORDER — ASPIRIN 81 MG PO CHEW: 81.0000 mg | CHEWABLE_TABLET | Freq: Every day | ORAL | Status: DC

## 2015-05-21 NOTE — Discharge Summary (Signed)
Physician Discharge Summary   [name] [name] ID: [name] [name] [name] MRN: [id number] DOB/AGE: 08-12-1940 75 y.o.  Admit date: 05/16/2015 Discharge date: 05/21/2015  Primary Care Physician:  [name] November, MD  Discharge Diagnoses:    . Non-ST elevation MI (NSTEMI) ([place]) status post PTCA of OM1  . Type 2 diabetes mellitus with vascular disease ([place]) . Hyperlipidemia   Acute systolic CHF with ischemic cardiomyopathy   Ischemic cardiomyopathy  Hypertensive heart disease  Type 2 diabetes mellitus, insulin-dependent   Enterococcus urinary colonization, urinary retention status post Foley catheter   Seizure disorder  Consults: Cardiology Infectious disease, Dr. Linus [name] [name] Urology, Dr. Jeffie [name] [name]    Recommendations for Outpatient Follow-up:  1. Home health PT OT, RN, home health aide to be arranged by case management 2. Please repeat CBC/BMET at next visit 3. Continue Foley catheter, [name] [name] has urology appointment with Dr. Noah [name] [name] on 3/28 for voiding trial 4.  Follow hemoglobin A1c 5. Discontinue aspirin in 1 month   DIET: Carb modified diet    Allergies:   Allergies  Allergen Reactions  . Procaine Hcl Anaphylaxis     DISCHARGE MEDICATIONS: Current Discharge Medication List    START taking these medications   Details  aspirin 81 MG chewable tablet Chew 1 tablet (81 mg total) by mouth daily. Qty: 30 tablet, Refills: 3    clopidogrel (PLAVIX) 75 MG tablet Take 1 tablet (75 mg total) by mouth daily. Qty: 30 tablet, Refills: 4    furosemide (LASIX) 40 MG tablet Take 1 tablet (40 mg total) by mouth 2 (two) times daily. Qty: 60 tablet, Refills: 3    lisinopril (PRINIVIL,ZESTRIL) 2.5 MG tablet Take 1 tablet (2.5 mg total) by mouth daily. Qty: 30 tablet, Refills: 4    nitroGLYCERIN (NITROSTAT) 0.4 MG SL tablet Place 1 tablet (0.4 mg total) under [name] tongue every 5 (five) minutes as needed for chest pain. Qty: 30 tablet, Refills: 12    promethazine (PHENERGAN) 12.5 MG tablet Take 1  tablet (12.5 mg total) by mouth every 6 (six) hours as needed for nausea or vomiting. Qty: 30 tablet, Refills: 0      CONTINUE these medications which have CHANGED   Details  atorvastatin (LIPITOR) 40 MG tablet Take 2 tablets (80 mg total) by mouth at bedtime. Qty: 60 tablet, Refills: 3    LORazepam (ATIVAN) 0.5 MG tablet Take 0.5 tablets (0.25 mg total) by mouth every 8 (eight) hours as needed for anxiety. Qty: 20 tablet, Refills: 0    metoprolol (LOPRESSOR) 100 MG tablet Take 1 tablet (100 mg total) by mouth 2 (two) times daily. Qty: 60 tablet, Refills: 4      CONTINUE these medications which have NOT CHANGED   Details  betamethasone dipropionate (DIPROLENE) 0.05 % cream Apply topically as needed.    calcium-vitamin D (OSCAL WITH D) 500-200 MG-UNIT per tablet Take 1 tablet by mouth daily.    escitalopram (LEXAPRO) 10 MG tablet Take 2 tablets (20 mg total) by mouth daily. Qty: 90 tablet, Refills: 2    Multiple Vitamins-Minerals (CENTRUM SILVER ADULT 50+ PO) Take 1 tablet by mouth every morning.    NOVOLIN 70/30 RELION (70-30) 100 UNIT/ML injection INJECT 50 UNITS SUBCUTANEOUSLY ONCE DAILY WITH BREAKFAST Qty: 20 mL, Refills: 2    phenytoin (DILANTIN) 100 MG ER capsule Take 3 capsules (300 mg total) by mouth daily. Qty: 90 capsule, Refills: 5    saccharomyces boulardii (FLORASTOR) 250 MG capsule Take 1 capsule (250 mg total) by mouth 2 (two) times daily. Qty: 30 capsule,  Refills: 0    warfarin (COUMADIN) 2.5 MG tablet Take 1.25 mg by mouth daily.      STOP taking these medications     cephALEXin (KEFLEX) 500 MG capsule      ciprofloxacin (CIPRO) 500 MG tablet      diltiazem (CARDIZEM CD) 360 MG 24 hr capsule      nitrofurantoin (MACRODANTIN) 100 MG capsule          Brief H and P: For complete details please refer to admission H and P, but in brief [name] [name] [name] is a 75 y.o. female history diabetes mellitus type 2, chronic atrial fibrillation, history of  seizure from encephalitis who was admitted last week for sepsis secondary to pyelonephritis presents to [name] ER because of chest pain. [name] [name] started developing chest pain around afternoon yesterday which was pressure-like and enveloping [name] whole chest with diaphoresis. [name] [name] with rectal bleeding to go to her PCP and was referred to [name] ER. In [name] ER EKG shows A. fib with nonspecific ST changes. Troponin was markedly elevated around 44. [name] [name] INR was subtherapeutic [name] [name] being on Coumadin. On call cardiologist [name] [name] [name] was consulted and [name] [name] had been admitted for non-ST elevation MI. [name] [name] chest pain has improved at this time.  Hospital Course:  Non-ST elevation MI (NSTEMI) (HCC) - Troponin elevated 47 on admission, [name] [name] was placed on IV nitro drip and heparin drip. Cardiology was consulted. - [name] [name] underwent cardiac cath 3/17, moderate LV systolic dysfunction, [id number] stenosed first marginal, balloon angioplasty done, no stent placed. - Per cardiology, continue aspirin, Plavix, metoprolol, ACEI and statin. Discontinue aspirin in 1 month. - [name] [name] has appointment with cardiology for follow-up on 4/7.  Enterococcus UTI with pyelonephritis - [name] [name] recently had Escherichia coli pyelonephritis and was placed on cephalexin by urology, was continued during this hospitalization. However urine culture resulted on 3/19 as enterococcus, hence antibiotics were changed to amoxicillin. - On 3/20 overnight, [name] [name] had acute abdominal pain, dysuria CT abdomen was obtained which showed persistent emphysematous cystitis, new increased density of [name] urine in [name] bladder suggest hemorrhage, possible pyelonephritis. Antibiotics were changed to IV ampicillin. - Infectious disease consultation was obtained, [name] [name] was seen by Dr. Linus [name] [name]. Per ID she has positive urine culture for enterococcus and CT findings of infiltration around kidney,may indicating pyelonephritis otherwise she is asymptomatic making  this consistent with asymptomatic bacteriuria which does not require treatment. ID recommended to stop ampicillin and observe off of antibiotics. - Urology was also consulted, [name] [name] was seen by Dr. Jeffie [name] [name], recommended Foley catheter to empty [name] bladder, [name] [name] has an appointment with Dr. Noah [name] [name] on 3/28 for voiding trial.    Acute on chronic 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[place]) - 2-D echo 3/10 showed EF of 35-40% with diffuse hypokinesis, - [name] [name] was placed on Lasix by cardiology, feels a lot better today on discharge. Cardiology recommended to continue Lasix 40 mg twice a day. Please follow BMET outpatient closely   Seizure disorder ([place]) - Continue Dilantin  Atrial fibrillation, paroxysmal ([place])-  - Currently rate controlled - CHADS vasc 5, Coumadin restarted, Per cardiology, recommended higher dose of beta blocker and discontinue Cardizem due to ischemic cardiomyopathy and depressed EF.  Type 2 diabetes mellitus with vascular disease ([place]) uncontrolled - [name] [name] was placed on Lantus, sliding scale insulin while inpatient - Inpatient diabetes consult was placed - A1c 7.0  Generalized debility/deconditioning PT evaluation recommended skilled nursing facility however [name] [name] refused, stating that she feels more comfortable at home. Case management arranged home health PT, OT, HHA,  HR N  Day of Discharge BP 155/65 mmHg  Pulse 106  Temp(Src) 97.9 F (36.6 C) (Oral)  Resp 18  Ht 5\' 9"  (1.753 m)  Wt 100.562 kg (221 lb 11.2 oz)  BMI 32.72 kg/m2  SpO2 95%  Physical Exam: General: Alert and awake oriented x3 not in any acute distress. HEENT: anicteric sclera, pupils reactive to light and accommodation CVS: S1-S2 clear irregularly irregular Chest: Minimal bibasilar crackles Abdomen: soft nontender, nondistended, normal bowel sounds Extremities: no cyanosis, clubbing, trace edema noted bilaterally Neuro: Cranial nerves II-XII intact, no focal neurological deficits   [name] results of  significant diagnostics from this hospitalization (including imaging, microbiology, ancillary and laboratory) are listed below for reference.    LAB RESULTS: Basic Metabolic Panel:  Recent Labs Lab 05/20/15 0110 05/21/15 0313  NA 136 137  K 4.4 3.9  CL 95* 94*  CO2 31 32  GLUCOSE 156* 106*  BUN 13 15  CREATININE 1.32* 1.34*  CALCIUM 8.8* 8.6*   Liver Function Tests:  Recent Labs Lab 05/17/15 0708 05/20/15 0110  AST 142* 39  ALT 21 18  ALKPHOS 103 112  BILITOT 0.8 0.5  PROT 6.4* 6.7  ALBUMIN 2.5* 2.6*    Recent Labs Lab 05/20/15 0110  LIPASE 15   No results for input(s): AMMONIA in [name] last 168 hours. CBC:  Recent Labs Lab 05/20/15 0110 05/21/15 0313  WBC 15.5* 10.3  NEUTROABS 12.4*  --   HGB 9.3* 8.3*  HCT 28.4* 26.7*  MCV 92.2 93.7  PLT 413* 380   Cardiac Enzymes:  Recent Labs Lab 05/20/15 0610 05/20/15 1250  TROPONINI 13.12* 10.69*   BNP: Invalid input(s): POCBNP CBG:  Recent Labs Lab 05/21/15 0839 05/21/15 1151  GLUCAP 214* 176*    Significant Diagnostic Studies:  Ct Abdomen Pelvis Wo Contrast  05/16/2015  CLINICAL DATA:  Belching and constipation. EXAM: CT ABDOMEN AND PELVIS WITHOUT CONTRAST TECHNIQUE: Multidetector CT imaging of [name] abdomen and pelvis was performed following [name] standard protocol without IV contrast. COMPARISON:  05/06/2015 FINDINGS: Lower chest and abdominal wall: There is new septal thickening and pleural fluid at [name] bases. Hepatobiliary: No focal liver abnormality.Cholelithiasis without signs of cholecystitis. Calcification near [name] pancreatic head is likely in [name] GDA or pancreas. No evidence of calcified choledocholithiasis. Pancreas: Atrophy with 15 mm pancreatic tail lesion. This appeared cystic on recent enhanced imaging. Spleen: Unremarkable. Adrenals/Urinary Tract:  Negative adrenals. Persistent asymmetric right perinephric edema without hydronephrosis. There are bilateral renal cysts and patchy right renal  cortical scarring. No hydronephrosis or ureteral calculus. Persistent pericystic haziness and mural gas at [name] apex. Reproductive:Negative Stomach/Bowel:  No obstruction. No inflammation. Vascular/Lymphatic: No acute vascular abnormality. No mass or adenopathy. Peritoneal: No ascites or pneumoperitoneum. Musculoskeletal: Degenerative changes and levoscoliosis. Remote intertrochanteric right femur fracture with repair and healing. IMPRESSION: 1. Emphysematous cystitis. Persistent asymmetric right perinephric edema which could reflect pyelonephritis. 2. CHF. 3. Cholelithiasis. 4. 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 Reference CT report 03/06/2015 for follow-up recommendations. Electronically Signed   By: Monte Fantasia M.D.   On: 05/16/2015 21:59   Dg Chest Port 1 View  05/16/2015  CLINICAL DATA:  Weakness and chest pain all day EXAM: PORTABLE CHEST 1 VIEW COMPARISON:  May 07, 2015 FINDINGS: [name] heart size and mediastinal contours are stable. [name] heart size is enlarged. There is pulmonary edema. There is no focal pneumonia or pleural effusion. There is mild atelectasis of both lung bases. [name] visualized skeletal structures are unremarkable. IMPRESSION: Congestive heart failure. Electronically Signed  By: Abelardo Diesel M.D.   On: 05/16/2015 21:02     Procedures    Coronary Balloon Angioplasty   Left Heart Cath and Coronary Angiography    Conclusion     There is moderate left ventricular systolic dysfunction.  1st Mrg lesion, 100% stenosed. Intervention was performed with balloon angioplasty. No stent was placed.  At [name] end of [name] procedure, [name] initial occlusion site had no residual stenosis. There did remain a large branch downstream of [name] initial occlusion which remained occluded and did not respond to angioplasty. There is a smaller branch which had TIMI-3 flow restored. [name] [name] [name] was pain-free at [name] end of [name] procedure.  Moderate LV dysfunction. Moderately elevated LVEDP.  Will stop  anticoagulation at this point. Oral antiplatelet therapy was not given due to this vague history of rectal bleeding. No stent was placed. If there is no contraindication of bleeding, would load [name] [name] [name] with clopidogrel 300 mg followed by 75 mg daily. She will also have to go back on Coumadin. Risk-benefit decision regarding additional antiplatelet therapy will have to be performed. Continue aggressive secondary prevention including high-dose statin and therapy for LV dysfunction.     Disposition and Follow-up: Discharge Instructions    (HEART FAILURE PATIENTS) Call MD:  Anytime you have any of [name] following symptoms: 1) 3 pound weight gain in 24 hours or 5 pounds in 1 week 2) shortness of breath, with or without a dry hacking cough 3) swelling in [name] hands, feet or stomach 4) if you have to sleep on extra pillows at night in order to breathe.    Complete by:  As directed      Diet - low sodium heart healthy    Complete by:  As directed      Diet Carb Modified    Complete by:  As directed      Discharge instructions    Complete by:  As directed   It is VERY IMPORTANT that you follow up with a PCP on a regular basis.  Check your blood glucoses before each meal and at bedtime and maintain a log of your readings.  Bring this log with you when you follow up with your PCP so that he or she can adjust your insulin at your follow up visit.   Discontinue aspirin in 1 month   Please continue Foley catheter, you have urology appointment with Dr. Noah [name] [name] on 3/28 for voiding trial.     Increase activity slowly    Complete by:  As directed             DISPOSITION: Home with home PT   DISCHARGE FOLLOW-UP Follow-up Information    Follow up with Cleon Gustin, MD. Go on 05/28/2015.   Specialty:  Urology   Why:  for hospital follow-up, voiding trial @ 12pm   Contact information:   [place] [id number] [phone]       Follow up with Sharol Harness, MD. Go on  06/07/2015.   Specialty:  Cardiology   Why:  for hospital follow-up @ 11am   Contact information:   [place] [address] [phone]       Follow up with [name] November, MD. Schedule an appointment as soon as possible for a visit in 2 weeks.   Specialty:  Internal Medicine   Why:  for hospital follow-up   Contact information:   [address] [place] [address]  [phone]        Time spent on Discharge: 45 minutes  Signed:   Reynol Arnone M.D. [facility] 05/21/2015, 1:56 PM Pager: [phone]

## 2015-05-21 NOTE — Consult Note (Signed)
   [facility] CM Inpatient Consult   05/21/2015  [name] [name] [birth year] [id number] Came by to see patient but staff states she 'just' left for discharge home.  Will reach out to follow up for [place] Management needs.  For questions, please contact: Natividad Brood, RN BSN [facility] Liaison  [phone] business mobile phone Toll free office [phone]

## 2015-05-21 NOTE — Progress Notes (Signed)
Patient Name: [name] [name] Date of Encounter: 05/21/2015  Hospital Problem List     Principal Problem:   Non-ST elevation MI (NSTEMI) [facility]) Active Problems:   CAD (coronary artery disease)   Ischemic cardiomyopathy   Acute systolic CHF (congestive heart failure) (HCC)   DM (diabetes mellitus) ([place])   Type 2 diabetes mellitus with vascular disease ([place])   Hypertensive heart disease   PAF (paroxysmal atrial fibrillation) (HCC)   Hyperlipidemia   Seizure disorder (HCC)   Anticoagulated on Coumadin    Subjective   Feels much better this AM.  Breathing significantly improved - off of O2.  No c/p.  Back in AF - asymptomatic.  Inpatient Medications    . aspirin  81 mg Oral Daily  . atorvastatin  80 mg Oral QHS  . bisacodyl  10 mg Rectal Once  . calcium-vitamin D  1 tablet Oral Q breakfast  . clopidogrel  75 mg Oral Daily  . escitalopram  20 mg Oral Daily  . furosemide  40 mg Oral BID  . insulin aspart  0-5 Units Subcutaneous QHS  . insulin aspart  0-9 Units Subcutaneous TID WC  . insulin aspart  3 Units Subcutaneous TID WC  . insulin glargine  20 Units Subcutaneous Daily  . lisinopril  2.5 mg Oral Daily  . metoprolol tartrate  100 mg Oral BID  . phenytoin  300 mg Oral Daily  . polyethylene glycol  17 g Oral Daily  . sodium chloride flush  3 mL Intravenous Q12H  . warfarin  1.25 mg Oral q1800  . Warfarin - Pharmacist Dosing Inpatient   Does not apply q1800    Vital Signs    Filed Vitals:   05/20/15 2156 05/20/15 2200 05/21/15 0607 05/21/15 0616  BP:  110/47 155/65   Pulse: 46 48 106   Temp:  97.4 F (36.3 C) 97.9 F (36.6 C)   TempSrc:  Oral Oral   Resp:  18 18   Height:      Weight:    221 lb 11.2 oz (100.562 kg)  SpO2:  100% 95%     Intake/Output Summary (Last 24 hours) at 05/21/15 1251 Last data filed at 05/21/15 1137  Gross per 24 hour  Intake    240 ml  Output   2300 ml  Net  -2060 ml   Filed Weights   05/18/15 0415 05/19/15 0455 05/21/15  0616  Weight: 227 lb 15.3 oz (103.4 kg) 222 lb 8 oz (100.925 kg) 221 lb 11.2 oz (100.562 kg)    Physical Exam    General: Pleasant, NAD. Neuro: Alert and oriented X 3. Moves all extremities spontaneously. Psych: Normal affect. HEENT:  Normal  Neck: Supple without bruits or JVD. Lungs:  Resp regular and unlabored, bibasilar crackles. Heart: IR, IR no s3, s4, or murmurs. Abdomen: Soft, mild RUQ tenderness, non-distended, BS + x 4.  Extremities: No clubbing, cyanosis.  Trace ankle edema - improved compared to 3/20. DP/PT/Radials 2+ and equal bilaterally.  Labs    CBC  Recent Labs  05/20/15 0110 05/21/15 0313  WBC 15.5* 10.3  NEUTROABS 12.4*  --   HGB 9.3* 8.3*  HCT 28.4* 26.7*  MCV 92.2 93.7  PLT 413* [id number]   Basic Metabolic Panel  Recent Labs  05/20/15 0110 05/21/15 0313  NA 136 137  K 4.4 3.9  CL 95* 94*  CO2 31 32  GLUCOSE 156* 106*  BUN 13 15  CREATININE 1.32* 1.34*  CALCIUM 8.8* 8.6*  Liver Function Tests  Recent Labs  05/20/15 0110  AST 39  ALT 18  ALKPHOS 112  BILITOT 0.5  PROT 6.7  ALBUMIN 2.6*    Recent Labs  05/20/15 0110  LIPASE 15   Cardiac Enzymes  Recent Labs  05/20/15 0110 05/20/15 0610 05/20/15 1250  TROPONINI 15.66* 13.12* 10.69*   Hemoglobin A1C  Recent Labs  05/19/15 0518  HGBA1C 7.0*   Lab Results  Component Value Date   INR 2.35* 05/21/2015   INR 2.07* 05/20/2015   INR 1.96* 05/19/2015   PROTIME 19.0 07/26/2008    Telemetry    Converted back to coarse AF this AM @ 10:16 - rate controlled.  Assessment & Plan    1. NSTEMI/CAD: Admitted 3/17 with multiple complaints and markedly elevated troponin. S/P PTCA of the OM1 on 3/17. She has been doing well post-pci w/o recurrent c/p. Feeling better this AM - dyspnea resolved. No longer on O2.  Cont asa, statin, plavix,  blocker, acei. Plan to d/c ASA after one month. Cardiac rehab has seen.  2. PAF: Back in asymp, rate-controlled AF this AM. INR Rx.  Cont   blocker.  3. Acute systolic CHF/ICM:  EF known to be reduced @ 35-40% by echo on 3/10, 35-45% by V gram on 3/17. Pt with worsening dyspnea on 3/19. Lasix increased to 40 PO BID on 3/20.  Feels much better today.  Off of O2.  Less ankle edema. Still with bibasilar crackles but overall exam is improved.  Minus 580 ml 3/20 and 1.L so far today.  Wt down 1 lb.  Cont  blocker and acei. Cont lasix @ current dose of 40 PO bid. BUN/creat relatively stable.  4. Hypertensive Heart Disease: BP variable - 110 - 155.  Cont  blocker and acei.  5. HL: LDL 98 in 12/2013. LFT's wnl. In setting of ACS lipitor was increased to 80 daily.  6. Type II DM: Per IM.  7. Enterococcus UTI/Pyelonephritis: Abx per IM/ID  8. Sz D/O: Dilantin per IM.  Signed, Murray Hodgkins NP

## 2015-05-21 NOTE — Progress Notes (Signed)
CARDIAC REHAB PHASE I   PRE:  Rate/Rhythm: 80 afib    BP: sitting 144/63    SaO2: 95 RA  MODE:  Ambulation: 120 ft   POST:  Rate/Rhythm: 91 afib    BP: sitting 134/73     SaO2: 89-90RA  Pt able to ambulate with RW, min assist. SaO2 low at 89-90 RA. Gave pt IS to begin using and take home with her. Discussed MI, CHF, PTCA, low sodium diet, daily wts, ex gl and CRPII with pt and husband. They both confuse information at times. Agreeable to CRPII in [place] and will send referral. She will benefit from HHPT/RN. Has RW. [place], ACSM 05/21/2015 2:42 PM

## 2015-05-21 NOTE — Progress Notes (Signed)
ANTICOAGULATION CONSULT NOTE  Pharmacy Consult:  Restart Coumadin Indication:  H/o afib  Allergies  Allergen Reactions  . Procaine Hcl Anaphylaxis    Patient Measurements: Height: 5\' 9"  (175.3 cm) Weight: 221 lb 11.2 oz (100.562 kg) IBW/kg (Calculated) : 66.2   Vital Signs: Temp: 97.9 F (36.6 C) (03/21 0607) Temp Source: Oral (03/21 0607) BP: 155/65 mmHg (03/21 0607) Pulse Rate: 106 (03/21 0607)  Labs:  Recent Labs  05/19/15 0518 05/20/15 0110 05/20/15 0610 05/20/15 1250 05/21/15 0313  HGB 9.0* 9.3*  --   --  8.3*  HCT 28.4* 28.4*  --   --  26.7*  PLT 417* 413*  --   --  380  LABPROT 22.3* 23.2*  --   --  25.5*  INR 1.96* 2.07*  --   --  2.35*  CREATININE 1.30* 1.32*  --   --  1.34*  TROPONINI  --  15.66* 13.12* 10.69*  --     Estimated Creatinine Clearance: 47.2 mL/min (by C-G formula based on Cr of 1.34).  Assessment: 13 yof admitted 05/16/2015 with CP. She was on coumadin PTA for h/o afib.  Coumadin was held & started on IV heparin for NSTEMI.   Now s/p cardiac cath/balloon angioplasty, IV heparin dc'd and pharmacy consulted to  restart coumadin. Cardiology note plan asa81+ plavix +warf x1 month then stop asa continue plavix/warfarin. No bleeding noted.   INR 1.86 > 1.96> 2.07>2.35 after boost 2.5mg  and now back on home dose - continue  No bleeding CBC stable  PTA coumadin dose = 1.25mg  daily (last dose on 05/15/15)  Goal of Therapy:  INR = 2-3  Monitor platelets by anticoagulation protocol: Yes   Plan:  Continue home warfarin 1.25mg  daily Daily INR  Thank you Anette Guarneri, PharmD [phone] 05/21/2015 11:02 AM

## 2015-05-21 NOTE — Clinical Social Work Note (Signed)
BSW intern met with patient at bedside to discuss SNF options. Patient stated that she did not want to go to a SNF, but she would rather go home. BSW intern explained to patient that a SNF would be for short-term rehab and nursing care, but patient explained she had been to a facility before. Patient stated that her care was good at a SNF but home is where she wanted to go. Patient stated that her husband lives at home with her and is there all day to be of assistance. Patient also stated she has had HH services in the past but patient is unable to remember the provider. Patient stated that her husband would be able to transport her home when ready for discharge. BSW intern signing off.    BSW Intern, [phone]  

## 2015-05-21 NOTE — Progress Notes (Signed)
Discharge instructions gone over with patient and family. Patient and family instructed on how to perform foley care. Pt discharged with family member.    Ruben Reason, RN

## 2015-05-22 ENCOUNTER — Ambulatory Visit: Payer: Medicare Other | Admitting: Internal Medicine

## 2015-05-22 ENCOUNTER — Other Ambulatory Visit: Payer: Self-pay

## 2015-05-22 NOTE — Patient Outreach (Signed)
Called patient to follow up on [place] management needs post hospital.  HIPPA verified.  Patient states she is doing fine and she has heard from [facility] [facility].  She states that they have a scheduled a visit with her for tomorrow.  Patient states she feels she has no extra needs as she states, "I'm doing fine.  Let me see how things go with the home health.  They had been coming 2 weeks before I went in the hospital and I feel I am going to be all right without anything extra."  Patient verbalized she will call back if her needs changes.  Natividad Brood, RN BSN [facility] Liaison  [phone] business mobile phone Toll free office [phone]

## 2015-05-23 ENCOUNTER — Telehealth: Payer: Self-pay

## 2015-05-23 NOTE — Telephone Encounter (Signed)
Received [place] from [facility], form signed and faxed back to [phone]. Plan of care sent for scanning.

## 2015-05-23 NOTE — Telephone Encounter (Signed)
Transition Care Management Follow-up Telephone Call  ADMISSION DATE: 05/16/15  DISCHARGE DATE: 05/16/15    How have you been since you were released from the hospital? Patient states she has felt much better than before she went to hospital.Has appetite and can sleep now.     Do you understand why you were in the hospital? YES   Do you understand the discharge instrcutions? Yes  Items Reviewed:  Medications reviewed:  Yes some changes made  Allergies reviewed: Yes  Dietary changes reviewed:Carb Modified Diet  Referrals reviewed: Yes, patient scheduled for Cardiology,Internal Medicine,and Urology    Functional Questionnaire:   Activities of Daily Living (ADLs):  No help needed at this time  Any transportation issues/concerns?:Patient does not drive, husband transports.   Any patient concerns? Marland KitchenNo concerns per patient.   Confirmed importance and date/time of follow-up visits scheduled: Yes  Confirmed with patient if condition begins to worsen call PCP or go to the ER. Yes         Patient was given the [place] line [phone]: Yes

## 2015-05-23 NOTE — Telephone Encounter (Signed)
thx

## 2015-05-25 LAB — CULTURE, BLOOD (ROUTINE X 2)
CULTURE: NO GROWTH
Culture: NO GROWTH

## 2015-05-27 ENCOUNTER — Ambulatory Visit (INDEPENDENT_AMBULATORY_CARE_PROVIDER_SITE_OTHER): Payer: Medicare Other | Admitting: *Deleted

## 2015-05-27 DIAGNOSIS — Z5181 Encounter for therapeutic drug level monitoring: Secondary | ICD-10-CM | POA: Diagnosis not present

## 2015-05-27 DIAGNOSIS — I4891 Unspecified atrial fibrillation: Secondary | ICD-10-CM

## 2015-05-27 DIAGNOSIS — Z7901 Long term (current) use of anticoagulants: Secondary | ICD-10-CM

## 2015-05-27 DIAGNOSIS — I4892 Unspecified atrial flutter: Secondary | ICD-10-CM

## 2015-05-27 LAB — POCT INR: INR: 4

## 2015-05-28 ENCOUNTER — Telehealth: Payer: Self-pay

## 2015-05-28 NOTE — Telephone Encounter (Signed)
Received PT orders for 1 week 1, 2 week 3. Signed by Dr. Larose Kells and faxed to [phone]. Form sent for scanning.

## 2015-05-28 NOTE — Telephone Encounter (Signed)
Received fax confirmation on 05/28/2015 at 5:27 PM.

## 2015-05-29 ENCOUNTER — Telehealth: Payer: Self-pay | Admitting: *Deleted

## 2015-05-29 ENCOUNTER — Ambulatory Visit (INDEPENDENT_AMBULATORY_CARE_PROVIDER_SITE_OTHER): Payer: Medicare Other | Admitting: Internal Medicine

## 2015-05-29 ENCOUNTER — Encounter: Payer: Self-pay | Admitting: Internal Medicine

## 2015-05-29 VITALS — BP 126/52 | HR 49 | Temp 97.6°F | Resp 16 | Ht 69.0 in | Wt 215.0 lb

## 2015-05-29 DIAGNOSIS — D649 Anemia, unspecified: Secondary | ICD-10-CM

## 2015-05-29 DIAGNOSIS — I25119 Atherosclerotic heart disease of native coronary artery with unspecified angina pectoris: Secondary | ICD-10-CM | POA: Diagnosis not present

## 2015-05-29 DIAGNOSIS — Z09 Encounter for follow-up examination after completed treatment for conditions other than malignant neoplasm: Secondary | ICD-10-CM

## 2015-05-29 DIAGNOSIS — E118 Type 2 diabetes mellitus with unspecified complications: Secondary | ICD-10-CM

## 2015-05-29 DIAGNOSIS — E119 Type 2 diabetes mellitus without complications: Secondary | ICD-10-CM

## 2015-05-29 DIAGNOSIS — Z794 Long term (current) use of insulin: Secondary | ICD-10-CM | POA: Diagnosis not present

## 2015-05-29 LAB — CBC WITH DIFFERENTIAL/PLATELET
BASOS PCT: 0.9 % (ref 0.0–3.0)
Basophils Absolute: 0.1 10*3/uL (ref 0.0–0.1)
EOS ABS: 0.1 10*3/uL (ref 0.0–0.7)
Eosinophils Relative: 1.5 % (ref 0.0–5.0)
HEMATOCRIT: 30.8 % — AB (ref 36.0–46.0)
HEMOGLOBIN: 10.1 g/dL — AB (ref 12.0–15.0)
LYMPHS PCT: 22.1 % (ref 12.0–46.0)
Lymphs Abs: 1.6 10*3/uL (ref 0.7–4.0)
MCHC: 32.6 g/dL (ref 30.0–36.0)
MCV: 91.3 fl (ref 78.0–100.0)
MONOS PCT: 9 % (ref 3.0–12.0)
Monocytes Absolute: 0.6 10*3/uL (ref 0.1–1.0)
Neutro Abs: 4.7 10*3/uL (ref 1.4–7.7)
Neutrophils Relative %: 66.5 % (ref 43.0–77.0)
Platelets: 387 10*3/uL (ref 150.0–400.0)
RBC: 3.38 Mil/uL — ABNORMAL LOW (ref 3.87–5.11)
RDW: 15.6 % — AB (ref 11.5–15.5)
WBC: 7 10*3/uL (ref 4.0–10.5)

## 2015-05-29 LAB — BASIC METABOLIC PANEL
BUN: 17 mg/dL (ref 6–23)
CHLORIDE: 102 meq/L (ref 96–112)
CO2: 29 mEq/L (ref 19–32)
CREATININE: 1.13 mg/dL (ref 0.40–1.20)
Calcium: 9.3 mg/dL (ref 8.4–10.5)
GFR: 50.1 mL/min — ABNORMAL LOW (ref >60.00)
Glucose, Bld: 65 mg/dL — ABNORMAL LOW (ref 70–99)
POTASSIUM: 4.2 meq/L (ref 3.5–5.1)
Sodium: 138 mEq/L (ref 135–145)

## 2015-05-29 LAB — IRON: IRON: 67 ug/dL (ref 42–145)

## 2015-05-29 LAB — FERRITIN: Ferritin: 69.7 ng/mL (ref 10.0–291.0)

## 2015-05-29 NOTE — Patient Instructions (Signed)
GO TO THE LAB :      Get the blood work     GO TO THE FRONT DESK Schedule your next appointment for a  routine checkup in 3 months Fasting?  No   Check the  blood pressure  daily. Also checked your heart rate Be sure your blood pressure is between 110/65 and  135/85. If it is consistently higher or lower, let me know  your heart rate should be above 50.  You are taking metoprolol 100 mg: 1 tablet twice a day Tonight take only half of the metoprolol May need to decrease metoprolol dose if your heart rate continued to be below 50 and you feel fatigue. Please call me with your blood pressure and heart rate in 3-4 days

## 2015-05-29 NOTE — Progress Notes (Signed)
Subjective:    Patient ID: [name] [name], female    DOB: Jul 05, 1940, 75 y.o.   MRN: [id number]  DOS:  05/29/2015 Type of visit - description : TCM 7, Hospital follow-up Interval history: Since the last time I saw her on October she has been hospitalized several times. 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 Needs a CBC and BMP.   Review of Systems  Since she left the hospital, she is taking the medications correctly. Today she feels fatigued, very tired, thinks related to the pill she took x1 last night for a yeast infection (Diflucan) however i noted her heart rate is  slow today. Denies chest pain, difficulty breathing. No nausea, vomiting, diarrhea. No fever or chills. Since they remove the Foley catheter, she is voiding okay. Denies abdominal pain, blood in the stools.   Past Medical History  Diagnosis Date  . Blindness of left eye   . Eye muscle weakness     Right eye weakness after cataract surgery  . Common migraine     ?hx of  . DIABETES MELLITUS, TYPE II 03/27/2006    dr [name]  . HYPERLIPIDEMIA 03/27/2006  . 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 HYPERTENSION 03/27/2006  . Atrial fibrillation ([place])     SVT dx 2007, cath 2007 mild CAD, then had an cardioversion, ablation; still on coumadin , has occ palpitation, EKG 03-2010 NSR  . Headache(784.0) 05/07/2009  . LUNG NODULE 09/01/2006    Excision, Bx [place]  . Osteopenia 2004    Dexa 2004 showed Osteopenia, DEXA 03/2007 normal  . Seizures ([place])   .  Intertrochanteric fracture of right hip ([place]) 07/13/2012  . 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 GERD (gastroesophageal reflux disease) 10/05/2011  . Recurrent urinary tract infection     Seeing Urology  . 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[place]) [id number]    Past Surgical History  Procedure Laterality Date  . Shoulder surgery    . Femur im nail Right 07/15/2012    Procedure: INTRAMEDULLARY (IM) NAIL HIP;  Surgeon: Johnny Bridge, MD;  Location: [facility];  Service: Orthopedics;  Laterality: Right;  . Cardiac catheterization N/A 05/17/2015    Procedure: Left Heart Cath and Coronary Angiography;  Surgeon: Jettie Booze, MD;  Location: [facility];  Service: Cardiovascular;  Laterality: N/A;  . Cardiac catheterization N/A 05/17/2015    Procedure: Coronary Balloon Angioplasty;  Surgeon: Jettie Booze, MD;  Location: [facility];  Service: Cardiovascular;  Laterality: N/A;    Social History   Social History  . Marital Status: Married    Spouse Name: Jenny Reichmann  . Number of Children: 1  . Years of Education: College   Occupational History  . retired     Social History Main Topics  . Smoking status: Former Smoker    Types: Cigarettes    Quit date: 03/02/1978  . Smokeless tobacco: Never Used  . Alcohol Use: No  . Drug Use: No  . Sexual Activity: No   Other Topics Concern  . Not on file   Social History Narrative   Patient lives at home spouse.   Moved  from [place] 2004   1 son, problems w/ drugs, at home             Medication List       This list is accurate as of: 05/29/15 11:59 PM.  Always use your most recent med list.               aspirin 81 MG chewable tablet  Chew 1 tablet (81 mg total) by mouth daily.     atorvastatin 40 MG tablet  Commonly known as:  LIPITOR  Take 2 tablets (80 mg total) by mouth at bedtime.     betamethasone dipropionate 0.05 % cream  Commonly known as:  DIPROLENE  Apply topically as needed.     calcium-vitamin D 500-200 MG-UNIT tablet  Commonly  known as:  OSCAL WITH D  Take 1 tablet by mouth daily.     CENTRUM SILVER ADULT 50+ PO  Take 1 tablet by mouth every morning.     clopidogrel 75 MG tablet  Commonly known as:  PLAVIX  Take 1 tablet (75 mg total) by mouth daily.     escitalopram 10 MG tablet  Commonly known as:  LEXAPRO  Take 2 tablets (20 mg total) by mouth daily.     furosemide 40 MG tablet  Commonly known as:  LASIX  Take 1 tablet (40 mg total) by mouth 2 (two) times daily.     lisinopril 2.5 MG tablet  Commonly known as:  PRINIVIL,ZESTRIL  Take 1 tablet (2.5 mg total) by mouth daily.     LORazepam 0.5 MG tablet  Commonly known as:  ATIVAN  Take 0.5 tablets (0.25 mg total) by mouth every 8 (eight) hours as needed for anxiety.     metoprolol 100 MG tablet  Commonly known as:  LOPRESSOR  Take 1 tablet (100 mg total) by mouth 2 (two) times daily.     nitroGLYCERIN 0.4 MG SL tablet  Commonly known as:  NITROSTAT  Place 1 tablet (0.4 mg total) under the tongue every 5 (five) minutes as needed for chest pain.     NOVOLIN 70/30 RELION (70-30) 100 UNIT/ML injection  Generic drug:  insulin NPH-regular Human  INJECT 50 UNITS SUBCUTANEOUSLY ONCE DAILY WITH BREAKFAST     phenytoin 100 MG ER capsule  Commonly known as:  DILANTIN  Take 3 capsules (300 mg total) by mouth daily.     promethazine 12.5 MG tablet  Commonly known as:  PHENERGAN  Take 1 tablet (12.5 mg total) by mouth every 6 (six) hours as needed for nausea or vomiting.     saccharomyces boulardii 250 MG capsule  Commonly known as:  FLORASTOR  Take 1 capsule (250 mg total) by mouth 2 (two) times daily.     warfarin 2.5 MG tablet  Commonly known as:  COUMADIN  Take 1.25 mg by mouth daily.           Objective:   Physical Exam BP 126/52 mmHg  Pulse 49  Temp(Src) 97.6 F (36.4 C) (Oral)  Resp 16  Ht 5\' 9"  (1.753 m)  Wt 215 lb (97.523 kg)  BMI 31.74 kg/m2  SpO2 98% General:   Well developed, well nourished , sitting in a wheelchair, no  distress, slightly pale appearing.  HEENT:  Normocephalic . Face symmetric, atraumatic Lungs:  CTA B Normal respiratory effort, no intercostal retractions, no accessory muscle use. Heart: Bradycardic,  no murmur.  No pretibial edema bilaterally  Skin: Not pale. Not jaundice Neurologic:  alert &  oriented X3.  Speech normal Psych--  Cognition and judgment appear intact.  Cooperative with normal attention span and concentration.  Behavior appropriate. No anxious or depressed appearing. Seems in good spirits     Assessment & Plan:   Assessment > DM  + retinopathy, Dr [name] HTN Hyperlipidemia GERD CV-- ---NSTEMI 05-2015, cath, angiplasty ---Echo, EF 40 % (05-2015) ---Atrial fibrillation, SVT -- dx 2007, cath 2007 mild CAD. S/p cardioversion, then ablation, on coumadin, last visit cards 2014  Osteopenia Herpes encephalitis 2014 Seizures (after encephalitis) Blindness, left eye Right eye weakness after cataract surgery Headaches Osteopenia per DEXA 2004, DEXA 2009 normal H/o lung bx 2008 benign   Plan  Diabetes: Due to see endocrinology, non-urgent referral placed CAD, recovering from a NSTEMI, she seems to be doing well. Her heart rate is 49 and she feels very tired, will decrease the dose of metoprolol for tonight only and monitor BPs and heart rate. Will call with readings in 2-3 days. Already has an appointment to see cardiology. She is to discontinue aspirin in April per discharge summary Atrial fibrillation, on Coumadin and beta blockers. Anemia: Found to have anemia at the hospital, denies any blood in the stools, we are checking a CBC, iron and ferritin. RTC 3 months.

## 2015-05-29 NOTE — Telephone Encounter (Signed)
Received physician orders; forwarded to provider/SLS 03/29

## 2015-05-29 NOTE — Progress Notes (Signed)
Pre visit review using our clinic review tool, if applicable. No additional management support is needed unless otherwise documented below in the visit note/SLS  

## 2015-05-30 ENCOUNTER — Encounter: Payer: Self-pay | Admitting: Neurology

## 2015-05-30 ENCOUNTER — Ambulatory Visit (INDEPENDENT_AMBULATORY_CARE_PROVIDER_SITE_OTHER): Payer: Medicare Other | Admitting: Neurology

## 2015-05-30 VITALS — BP 120/62 | HR 50 | Ht 69.0 in | Wt 216.0 lb

## 2015-05-30 DIAGNOSIS — G40909 Epilepsy, unspecified, not intractable, without status epilepticus: Secondary | ICD-10-CM | POA: Diagnosis not present

## 2015-05-30 DIAGNOSIS — I255 Ischemic cardiomyopathy: Secondary | ICD-10-CM | POA: Diagnosis not present

## 2015-05-30 NOTE — Assessment & Plan Note (Signed)
Diabetes: Due to see endocrinology, non-urgent referral placed CAD, recovering from a NSTEMI, she seems to be doing well. Her heart rate is 49 and she feels very tired, will decrease the dose of metoprolol for tonight only and monitor BPs and heart rate. Will call with readings in 2-3 days. Already has an appointment to see cardiology. She is to discontinue aspirin in April per discharge summary Atrial fibrillation, on Coumadin and beta blockers. Anemia: Found to have anemia at the hospital, denies any blood in the stools, we are checking a CBC, iron and ferritin. RTC 3 months.

## 2015-05-30 NOTE — Patient Instructions (Signed)
I had a long discussion with the patient and her husband regarding her seizures which appear quite stable and well controlled. I advised her to be compliant with her seizure medicines and to avoid seizure provoking stimuli. Continue Dilantin 300 mg daily. We also talked about fall precautions and to use her cane and walker at all times. She will return for follow-up in a year or call earlier if necessary Fall Prevention in the [place] can cause injuries and can affect people from all age groups. There are many simple things that you can do to make your home safe and to help prevent falls. WHAT CAN I DO ON THE OUTSIDE OF MY HOME?  Regularly repair the edges of walkways and driveways and fix any cracks.  Remove high doorway thresholds.  Trim any shrubbery on the main path into your home.  Use bright outdoor lighting.  Clear walkways of debris and clutter, including tools and rocks.  Regularly check that handrails are securely fastened and in good repair. Both sides of any steps should have handrails.  Install guardrails along the edges of any raised decks or porches.  Have leaves, snow, and ice cleared regularly.  Use sand or salt on walkways during winter months.  In the garage, clean up any spills right away, including grease or oil spills. WHAT CAN I DO IN THE BATHROOM?  Use night lights.  Install grab bars by the toilet and in the tub and shower. Do not use towel bars as grab bars.  Use non-skid mats or decals on the floor of the tub or shower.  If you need to sit down while you are in the shower, use a plastic, non-slip stool.[name]  Keep the floor dry. Immediately clean up any water that spills on the floor.  Remove soap buildup in the tub or shower on a regular basis.  Attach bath mats securely with double-sided non-slip rug tape.  Remove throw rugs and other tripping hazards from the floor. WHAT CAN I DO IN THE BEDROOM?  Use night lights.  Make sure that a bedside  light is easy to reach.  Do not use oversized bedding that drapes onto the floor.  Have a firm chair that has side arms to use for getting dressed.  Remove throw rugs and other tripping hazards from the floor. WHAT CAN I DO IN THE KITCHEN?   Clean up any spills right away.  Avoid walking on wet floors.  Place frequently used items in easy-to-reach places.  If you need to reach for something above you, use a sturdy step stool that has a grab bar.  Keep electrical cables out of the way.  Do not use floor polish or wax that makes floors slippery. If you have to use wax, make sure that it is non-skid floor wax.  Remove throw rugs and other tripping hazards from the floor. WHAT CAN I DO IN THE STAIRWAYS?  Do not leave any items on the stairs.  Make sure that there are handrails on both sides of the stairs. Fix handrails that are broken or loose. Make sure that handrails are as long as the stairways.  Check any carpeting to make sure that it is firmly attached to the stairs. Fix any carpet that is loose or worn.  Avoid having throw rugs at the top or bottom of stairways, or secure the rugs with carpet tape to prevent them from moving.  Make sure that you have a light switch at the top of  the stairs and the bottom of the stairs. If you do not have them, have them installed. WHAT ARE SOME OTHER FALL PREVENTION TIPS?  Wear closed-toe shoes that fit well and support your feet. Wear shoes that have rubber soles or low heels.  When you use a stepladder, make sure that it is completely opened and that the sides are firmly locked. Have someone hold the ladder while you are using it. Do not climb a closed stepladder.  Add color or contrast paint or tape to grab bars and handrails in your home. Place contrasting color strips on the first and last steps.  Use mobility aids as needed, such as canes, walkers, scooters, and crutches.  Turn on lights if it is dark. Replace any light bulbs that  burn out.  Set up furniture so that there are clear paths. Keep the furniture in the same spot.  Fix any uneven floor surfaces.  Choose a carpet design that does not hide the edge of steps of a stairway.  Be aware of any and all pets.  Review your medicines with your healthcare provider. Some medicines can cause dizziness or changes in blood pressure, which increase your risk of falling. Talk with your health care provider about other ways that you can decrease your risk of falls. This may include working with a physical therapist or trainer to improve your strength, balance, and endurance.   This information is not intended to replace advice given to you by your health care provider. Make sure you discuss any questions you have with your health care provider.   Document Released: 02/06/2002 Document Revised: 07/03/2014 Document Reviewed: 03/23/2014 Elsevier Interactive Patient Education [employer].

## 2015-05-30 NOTE — Telephone Encounter (Signed)
Received signed physician orders, faxed to [facility] ([phone]) [phone].

## 2015-05-30 NOTE — Progress Notes (Signed)
PATIENT: [name] [name] DOB: 06-Jun-1940  REASON FOR VISIT: follow up for seizure HISTORY FROM: patient  HISTORY OF PRESENT ILLNESS: Update 07/14/13 (LL):  Since last visit, has struggled with hip pain since surgery, denies falls, ambulates with a cane.  No recurrent seizures, states taking Dilantin 100 ER QID but level last month was only 3.5, she takes 2 in the morning, 1 in the afternoon and 1 at bedtime.  Right hand tremor is stable. MMSE is stable at 26/30.  Update 09/27/12 (LL): Patient comes in for 6 month follow up for seizures. Doing well, had fractured right hip recently from fall in yard, status post surgery 07/15/12. Released from rehab to home 3 weeks ago. Ambulating with cane, doing well. No further seizures since initial episode. She is on Dilantin 150 mg 3 times a day and seems to the tolerating it well without any dizziness, imbalance, diplopia or dysarthria. Thinks that memory is stable, only notable trouble is remembering numbers. Still has mild tremor of the right hand, reports it is better with Ativan. No new complaints.   Update January 21st 2014 (PS): She returns for followup after last visit on 12/11/2011. She continues to do well without any recurrent seizures following the one during her initial hospitalization in March 2013. She is on Dilantin 150 mg 3 times a day and seems to the tolerating it well without any dizziness, imbalance, diplopia or dysarthria. She continues to have mild short-term memory difficulties which are mainly related to her new memory. She can remember remote incidents quite well. She also complains of mild action tremor of the right hand but it has not been disabling.  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 She also has mild diabetic peripheral neuropathy. Overall doing well. One  syncopal episode related to hypoglycemia.  Update 11/14/13 : She returns for followup of her last visit 4 months ago. She continues to do well without recurrent seizures since the initial one in March 2013. She is currently on Vantin 400 mg daily and seems to be covering it very well. She does have mild tremor of the left hand but denies dizziness, diplopia or vertigo sleepiness. She continues to have mild short-term memory difficulties but she seems to be coping quite well with them and these are not progressive. In fact she is still balancing checkbooks and writing checks and not making any mistakes. She has no new complaints today at Update 05/17/2014 : She returns for follow-up after last visit 6 months ago. Patient continues to do well without recurrent seizures. She has tolerated decrease in the Dilantin to 300 mg daily. She has been depressed and upset by her son's activities. He is been arrested for house breaking and is in jail. She has short-term memory difficulties and needs help even with cooking. She has never been tried on medications for depression and she is willing to try treatment. Update 11/28/2014 : She returns for follow-up after last visit 6 months ago accompanied by her husband. She has not had any recurrent seizures and continues to tolerate Dilantin 300 mg at night quite well without significant side effects. She continues to have depression despite being on Lexapro 10 mg and this is situational related to his son's activities is currently in rehabilitation and may have to go to jail. She is tolerating Lexapro well without any side effects. She  does have upcoming visit with her primary physician next month. She has a lot of itching in her hands and is scratching her forearms. and this may be stress related Update 05/30/2015 ;  Patient continues to well from seizure standpoint has not had a seizure now for a couple of years. She remains on Dilantin 300 mg a day and is tolerating it well  without side effects However she was hospitalized a few times last month. She was admitted with MI, systolic heart failure with ischemic cardiomyopathy and had a cardiac cath on 05/17/15 and underwent angioplasty. She had another admission for UTI with enterococcus and abdominal pain CT abdomen showed emphysematous cystitis and she had a urology and ID consult. He was treated with antibiotics and has been gradual improvement. She continues to walk with a wheeled walker for long distances. She has had no fortunately falls. She has no other complaints today. REVIEW OF SYSTEMS: Full 14 system review of systems performed and notable only for:   ,   Walking difficulty, shortness of breath, hip pain, viral infection, heart attack, coronary stenting only and all other systems negative   ALLERGIES: Allergies  Allergen Reactions  . Procaine Hcl Anaphylaxis    HOME MEDICATIONS: Outpatient Prescriptions Prior to Visit  Medication Sig Dispense Refill  . aspirin 81 MG chewable tablet Chew 1 tablet (81 mg total) by mouth daily. 30 tablet 3  . atorvastatin (LIPITOR) 40 MG tablet Take 2 tablets (80 mg total) by mouth at bedtime. 60 tablet 3  . betamethasone dipropionate (DIPROLENE) 0.05 % cream Apply topically as needed.    . calcium-vitamin D (OSCAL WITH D) 500-200 MG-UNIT per tablet Take 1 tablet by mouth daily.    . clopidogrel (PLAVIX) 75 MG tablet Take 1 tablet (75 mg total) by mouth daily. 30 tablet 4  . escitalopram (LEXAPRO) 10 MG tablet Take 2 tablets (20 mg total) by mouth daily. 90 tablet 2  . furosemide (LASIX) 40 MG tablet Take 1 tablet (40 mg total) by mouth 2 (two) times daily. 60 tablet 3  . lisinopril (PRINIVIL,ZESTRIL) 2.5 MG tablet Take 1 tablet (2.5 mg total) by mouth daily. 30 tablet 4  . LORazepam (ATIVAN) 0.5 MG tablet Take 0.5 tablets (0.25 mg total) by mouth every 8 (eight) hours as needed for anxiety. 20 tablet 0  . metoprolol (LOPRESSOR) 100 MG tablet Take 1 tablet (100 mg total) by  mouth 2 (two) times daily. 60 tablet 4  . Multiple Vitamins-Minerals (CENTRUM SILVER ADULT 50+ PO) Take 1 tablet by mouth every morning.    . nitroGLYCERIN (NITROSTAT) 0.4 MG SL tablet Place 1 tablet (0.4 mg total) under the tongue every 5 (five) minutes as needed for chest pain. 30 tablet 12  . NOVOLIN 70/30 RELION (70-30) 100 UNIT/ML injection INJECT 50 UNITS SUBCUTANEOUSLY ONCE DAILY WITH BREAKFAST 20 mL 2  . phenytoin (DILANTIN) 100 MG ER capsule Take 3 capsules (300 mg total) by mouth daily. 90 capsule 5  . promethazine (PHENERGAN) 12.5 MG tablet Take 1 tablet (12.5 mg total) by mouth every 6 (six) hours as needed for nausea or vomiting. 30 tablet 0  . saccharomyces boulardii (FLORASTOR) 250 MG capsule Take 1 capsule (250 mg total) by mouth 2 (two) times daily. 30 capsule 0  . warfarin (COUMADIN) 2.5 MG tablet Take 1.25 mg by mouth daily.     No facility-administered medications prior to visit.     PHYSICAL EXAM  Filed Vitals:   05/30/15 1324  BP: 120/62  Pulse:  50  Height: 5\' 9"  (1.753 m)  Weight: 216 lb (97.977 kg)   Body mass index is 31.88 kg/(m^2). No exam data present   Generalized: In no acute distress,  pleasant elderly Caucasian female  Neck: Supple, no carotid bruits  Cardiac: Regular rate rhythm, no murmur  Pulmonary: Clear to auscultation bilaterally  Musculoskeletal: No deformity  SKIN : Multiple scratches bilateral forearms and hand  Neurological examination  Mentation: Alert oriented to time, place, history taking, language fluent, and casual conversation.  Cranial nerve II-XII: Pupils were equal round reactive to light extraocular movements were full,  legally blind left eye   Facial sensation and strength were normal. hearing was intact to finger rubbing bilaterally. Uvula tongue midline. head turning and shoulder shrug and were normal and symmetric.Tongue protrusion into cheek strength was normal.  MOTOR: normal bulk and tone, full strength in the BUE,  decreased strength 4/5 hip flexion RLE (due to hip surgery) , LLE 5/5 strength. fine finger movements normal, no pronator drift. Fine action and resting tremor of right hand. No cogwheel rigidity or bradykinesia.  SENSORY: normal and symmetric to light touch Vibration and position impaired over toes.  COORDINATION: finger-nose-finger, heel-to-shin bilaterally, there was no truncal ataxia  REFLEXES: Brachioradialis 2/2, biceps 2/2, triceps 2/2, patellar 1/1, Achilles trace/trace  GAIT/STATION: Rising up from seated position without assistance, wide stance, without trunk ataxia, slow but steady antallgic gait with favoring of the right hip. Unable to perform tiptoe, heel walking or tandem.   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 She also has mild diabetic peripheral neuropathy. Overall doing well.   PLAN :I had a long discussion with the patient and her husband regarding her seizures which appear quite stable and well controlled. I advised her to be compliant with her seizure medicines and to avoid seizure provoking stimuli. Continue Dilantin 300 mg daily. We also talked about fall precautions and to use her cane and walker at all times. She will return for follow-up in a year or call earlier if necessary    No orders of the defined types were placed in this encounter.   Return in about 1 year (around 05/29/2016).  Antony Contras, MD  05/30/2015, 2:35 PM [facility] [address], [place],  [address] [phone]  Note: This document was prepared with digital dictation and possible smart phrase technology. Any transcriptional errors that result from this process are unintentional.

## 2015-05-31 ENCOUNTER — Other Ambulatory Visit: Payer: Self-pay | Admitting: Endocrinology

## 2015-05-31 ENCOUNTER — Telehealth: Payer: Self-pay | Admitting: Internal Medicine

## 2015-05-31 NOTE — Telephone Encounter (Signed)
Spouse called in to provide pt's BP readings. He says that he would like to give to CMA directly himself.   Please CB: [phone]

## 2015-05-31 NOTE — Telephone Encounter (Signed)
Spoke with the patient and her husband, she feels better, BP was check this afternoon by PT, ~ 120/80, pulse in the 50s and she feels well. Plan: Continue present care

## 2015-05-31 NOTE — Telephone Encounter (Signed)
Spoke with patients husband and he states that her  blood pressure was 150/63 on 05/30/15. It was 139/72 this morning. Patient wants to know if they will need to adjust the medication. Please advise.

## 2015-06-05 ENCOUNTER — Ambulatory Visit (INDEPENDENT_AMBULATORY_CARE_PROVIDER_SITE_OTHER): Payer: Medicare Other | Admitting: *Deleted

## 2015-06-05 DIAGNOSIS — I4891 Unspecified atrial fibrillation: Secondary | ICD-10-CM | POA: Diagnosis not present

## 2015-06-05 DIAGNOSIS — Z7901 Long term (current) use of anticoagulants: Secondary | ICD-10-CM

## 2015-06-05 DIAGNOSIS — Z5181 Encounter for therapeutic drug level monitoring: Secondary | ICD-10-CM

## 2015-06-05 DIAGNOSIS — I4892 Unspecified atrial flutter: Secondary | ICD-10-CM | POA: Diagnosis not present

## 2015-06-05 LAB — POCT INR: INR: 1.2

## 2015-06-06 ENCOUNTER — Other Ambulatory Visit: Payer: Self-pay | Admitting: Internal Medicine

## 2015-06-06 NOTE — Telephone Encounter (Signed)
Pt is requesting refill on Dilantin.  Last OV: 05/29/2015 Last Fill: 11/26/2014 #90 and 5RF  Okay to refill?

## 2015-06-06 NOTE — Telephone Encounter (Signed)
Rx sent 

## 2015-06-06 NOTE — Telephone Encounter (Signed)
Okay #90 and 5 refills

## 2015-06-07 ENCOUNTER — Encounter: Payer: Self-pay | Admitting: Cardiovascular Disease

## 2015-06-07 ENCOUNTER — Ambulatory Visit (INDEPENDENT_AMBULATORY_CARE_PROVIDER_SITE_OTHER): Payer: Medicare Other | Admitting: Cardiovascular Disease

## 2015-06-07 VITALS — BP 140/72 | HR 49 | Ht 69.0 in | Wt 210.0 lb

## 2015-06-07 DIAGNOSIS — I2510 Atherosclerotic heart disease of native coronary artery without angina pectoris: Secondary | ICD-10-CM

## 2015-06-07 DIAGNOSIS — I2583 Coronary atherosclerosis due to lipid rich plaque: Secondary | ICD-10-CM

## 2015-06-07 DIAGNOSIS — I480 Paroxysmal atrial fibrillation: Secondary | ICD-10-CM

## 2015-06-07 DIAGNOSIS — I255 Ischemic cardiomyopathy: Secondary | ICD-10-CM | POA: Diagnosis not present

## 2015-06-07 DIAGNOSIS — I110 Hypertensive heart disease with heart failure: Secondary | ICD-10-CM

## 2015-06-07 DIAGNOSIS — E785 Hyperlipidemia, unspecified: Secondary | ICD-10-CM

## 2015-06-07 DIAGNOSIS — I10 Essential (primary) hypertension: Secondary | ICD-10-CM

## 2015-06-07 MED ORDER — METOPROLOL TARTRATE 50 MG PO TABS: 50.0000 mg | ORAL_TABLET | Freq: Two times a day (BID) | ORAL | Status: DC

## 2015-06-07 MED ORDER — LISINOPRIL 5 MG PO TABS: 5.0000 mg | ORAL_TABLET | Freq: Every day | ORAL | Status: DC

## 2015-06-07 NOTE — Patient Instructions (Addendum)
Medication Instructions:  DECREASE YOUR METOPROLOL TO 50 MG TWICE A DAY, NEW RX SENT TO PHARMACY   INCREASE YOUR LISINOPRIL TO 5 MG DAILY, NEW RX SENT TO PHARMACY   STOP YOUR ASPIRIN ON June 17, 2015  Labwork: NONE  Testing/Procedures: NONE  Follow-Up: Your physician recommends that you schedule a follow-up appointment in: [place]  If you need a refill on your cardiac medications before your next appointment, please call your pharmacy.

## 2015-06-07 NOTE — Progress Notes (Signed)
Cardiology Office Note   Date:  06/07/2015   ID:  [name] [name], Jacquart December 27, 1940, MRN [id number]  PCP:  [name], MD  Cardiologist:   Sharol Harness, MD   Chief Complaint  Patient presents with  . Follow-up    some shortness of breath, heart rate low yesterday      History of Present Illness: [name] [name] is a 75 y.o. female with diabetes mellitus, hypertension, paroxysmal atrial fibrillation, hyperlipidemia, CAD status post recent NSTEMI, and chronic systolic and diastolic heart failure who presents for hospital follow-up.  Prior to admission she had been recently discharged with pyelonephritis. She presented to her PCP for follow-up and was found to have a troponin of 44. She was referred to the emergency department and diagnosed with a non-ST elevation myocardial infarction.  During the hospitalization her EF was noted to be 35-40%.  She underwent cardiac catheterization and balloon angioplasty of a 100% OM1 lesion. She had a more distal lesion that did not respond to angioplasty. Her chest pain resolved.   Since discharge she has been doing well.  She has been getting better daily.  She has not noted any chest pain or shortness of breath.  She denies ower extremity edema, orthopnea or PND.  Her husband has been helping with the household chores so that she can rest.  She has been stressed dealing with her adult son with drug abuse.  He is currently in rehabilitation.  A home health nurse came yesterday and noted that her heart rate was low.  She is getting in home PT.  Past Medical History  Diagnosis Date  . Blindness of left eye   . Eye muscle weakness     Right eye weakness after cataract surgery  . Common migraine     ?hx of  . DIABETES MELLITUS, TYPE II 03/27/2006    dr [name]  . HYPERLIPIDEMIA 03/27/2006  . 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 HYPERTENSION 03/27/2006  . Atrial fibrillation ([place])     SVT dx 2007, cath 2007 mild CAD, then had an  cardioversion, ablation; still on coumadin , has occ palpitation, EKG 03-2010 NSR  . Headache(784.0) 05/07/2009  . LUNG NODULE 09/01/2006    Excision, Bx [facility]  . Osteopenia 2004    Dexa 2004 showed Osteopenia, DEXA 03/2007 normal  . Seizures ([place])   . Intertrochanteric fracture of right hip ([place]) 07/13/2012  . 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 GERD (gastroesophageal reflux disease) 10/05/2011  . Recurrent urinary tract infection     Seeing Urology  . 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[place]) [id number]    Past Surgical History  Procedure Laterality Date  . Shoulder surgery    . Femur im nail Right 07/15/2012    Procedure: INTRAMEDULLARY (IM) NAIL HIP;  Surgeon: Johnny Bridge, MD;  Location: [facility];  Service: Orthopedics;  Laterality: Right;  . Cardiac catheterization N/A 05/17/2015    Procedure: Left Heart Cath and Coronary Angiography;  Surgeon: Jettie Booze, MD;  Location: [facility];  Service: Cardiovascular;  Laterality: N/A;  . Cardiac catheterization N/A 05/17/2015    Procedure: Coronary Balloon Angioplasty;  Surgeon: Jettie Booze, MD;  Location: [facility];  Service: Cardiovascular;  Laterality: N/A;     Current Outpatient Prescriptions  Medication Sig Dispense Refill  . aspirin 81 MG chewable tablet Chew 1 tablet (81 mg total) by mouth daily. 30 tablet 3  . atorvastatin (LIPITOR) 40 MG tablet Take 2 tablets (80 mg total)  by mouth at bedtime. 60 tablet 3  . betamethasone dipropionate (DIPROLENE) 0.05 % cream Apply topically as needed.    . calcium-vitamin D (OSCAL WITH D) 500-200 MG-UNIT per tablet Take 1 tablet by mouth daily.    . clopidogrel (PLAVIX) 75 MG tablet Take 1 tablet (75 mg total) by mouth daily. 30 tablet 4  . escitalopram (LEXAPRO) 10 MG tablet Take 2 tablets (20 mg total) by mouth daily. 90 tablet 2  . furosemide (LASIX) 40 MG tablet Take 1 tablet (40 mg total) by mouth 2 (two) times daily. 60 tablet 3  . lisinopril (PRINIVIL,ZESTRIL) 2.5 MG tablet Take 1 tablet  (2.5 mg total) by mouth daily. 30 tablet 4  . LORazepam (ATIVAN) 0.5 MG tablet Take 0.5 tablets (0.25 mg total) by mouth every 8 (eight) hours as needed for anxiety. 20 tablet 0  . metoprolol (LOPRESSOR) 100 MG tablet Take 1 tablet (100 mg total) by mouth 2 (two) times daily. 60 tablet 4  . Multiple Vitamins-Minerals (CENTRUM SILVER ADULT 50+ PO) Take 1 tablet by mouth every morning.    . nitroGLYCERIN (NITROSTAT) 0.4 MG SL tablet Place 1 tablet (0.4 mg total) under the tongue every 5 (five) minutes as needed for chest pain. 30 tablet 12  . NOVOLIN 70/30 RELION (70-30) 100 UNIT/ML injection INJECT 50 UNITS SUBCUTANEOUSLY ONCE DAILY WITH BREAKFAST 20 mL 0  . phenytoin (DILANTIN) 100 MG ER capsule Take 3 capsules (300 mg total) by mouth daily. 90 capsule 5  . promethazine (PHENERGAN) 12.5 MG tablet Take 1 tablet (12.5 mg total) by mouth every 6 (six) hours as needed for nausea or vomiting. 30 tablet 0  . saccharomyces boulardii (FLORASTOR) 250 MG capsule Take 1 capsule (250 mg total) by mouth 2 (two) times daily. 30 capsule 0  . warfarin (COUMADIN) 2.5 MG tablet Take 1.25 mg by mouth daily.     No current facility-administered medications for this visit.    Allergies:   Procaine hcl    Social History:  The patient  reports that she quit smoking about 37 years ago. Her smoking use included Cigarettes. She has never used smokeless tobacco. She reports that she does not drink alcohol or use illicit drugs.   Family History:  The patient's  family history includes Diabetes in her father and sister; Heart attack (age of onset: 54) in her father. There is no history of Cancer.    ROS:  Please see the history of present illness.   Otherwise, review of systems are positive for none.   All other systems are reviewed and negative.    PHYSICAL EXAM: VS:  BP 140/72 mmHg  Pulse 49  Ht 5\' 9"  (1.753 m)  Wt 95.255 kg (210 lb)  BMI 31.00 kg/m2 , BMI Body mass index is 31 kg/(m^2). GENERAL:  Well  appearing HEENT:  Pupils equal round and reactive, fundi not visualized, oral mucosa unremarkable NECK:  No jugular venous distention, waveform within normal limits, carotid upstroke brisk and symmetric, no bruits, no thyromegaly LYMPHATICS:  No cervical adenopathy LUNGS:  Clear to auscultation bilaterally HEART:  RRR.  PMI not displaced or sustained,S1 and S2 within normal limits, no S3, no S4, no clicks, no rubs, no murmurs ABD:  Flat, positive bowel sounds normal in frequency in pitch, no bruits, no rebound, no guarding, no midline pulsatile mass, no hepatomegaly, no splenomegaly EXT:  2 plus pulses throughout, no edema, no cyanosis no clubbing SKIN:  No rashes no nodules NEURO:  Cranial nerves II through  XII grossly intact, motor grossly intact throughout PSYCH:  Cognitively intact, oriented to person place and time  EKG:  EKG is ordered today. The ekg ordered today demonstrates sinus bradycardia rate 49 bpm.  Inferolateral ischemia.   Echo 05/10/15: Study Conclusions  - Left ventricle: The cavity size was normal. There was moderate  concentric hypertrophy. Systolic function was moderately reduced.  The estimated ejection fraction was in the range of 35% to 40%.  Diffuse hypokinesis. The study was not technically sufficient to  allow evaluation of LV diastolic dysfunction due to atrial  fibrillation. - Aortic valve: Trileaflet; normal thickness leaflets. There was no  regurgitation. - Aortic root: The aortic root was normal in size. - Mitral valve: Mildly thickened leaflets . There was mild  regurgitation. - Left atrium: The atrium was moderately dilated. - Right ventricle: The cavity size was normal. Wall thickness was  normal. Systolic function was normal. - Right atrium: The atrium was normal in size. - Tricuspid valve: There was moderate regurgitation. - Pulmonic valve: There was no regurgitation. - Pulmonary arteries: Systolic pressure was mildly to moderately   increased. PA peak pressure: 45 mm Hg (S). - Inferior vena cava: The vessel was normal in size. - Pericardium, extracardiac: There was no pericardial effusion.  LHC 05/17/15:  There is moderate left ventricular systolic dysfunction.  1st Mrg lesion, 100% stenosed. Intervention was performed with balloon angioplasty. No stent was placed.  At the end of the procedure, the initial occlusion site had no residual stenosis. There did remain a large branch downstream of the initial occlusion which remained occluded and did not respond to angioplasty. There is a smaller branch which had TIMI-3 flow restored. The patient was pain-free at the end of the procedure.  Moderate LV dysfunction. Moderately elevated LVEDP.  Recent Labs: 05/06/2015: TSH 3.211 05/20/2015: ALT 18; B Natriuretic Peptide 1570.5* 05/29/2015: BUN 17; Creatinine, Ser 1.13; Hemoglobin 10.1*; Platelets 387.0; Potassium 4.2; Sodium 138    Lipid Panel    Component Value Date/Time   CHOL 167 01/09/2014 0808   TRIG 100.0 01/09/2014 0808   HDL 48.70 01/09/2014 0808   CHOLHDL 3 01/09/2014 0808   VLDL 20.0 01/09/2014 0808   LDLCALC 98 01/09/2014 0808      Wt Readings from Last 3 Encounters:  06/07/15 95.255 kg (210 lb)  05/30/15 97.977 kg (216 lb)  05/29/15 97.523 kg (215 lb)      ASSESSMENT AND PLAN:  # CAD s/p NSTEMI: Ms. Burkeen is doing well and denies recurrent chest pain.  Continue aspirin, Plavix, metoprolol and atorvastatin.  On 06/17/15 she will stop aspiring given that she is also on coumadin.  She is currently getting in home PT.  She is unable to participate in cardiac rehab.  If cleared from PT, we will refer her for cardiac rehab.  # Atrial fibrillation: She is currently in sinus rhythm and has not noted any recurrent palpitations.  Rate is in the 40s.  We will reduce  Metoprolol to 50 mg bid.  Continue warfarin monitoring at our [facility] office.  Offered to switch to our office but she goes to [facility].  regularly with her husband.  # Chronic systolic and diastolic heart failure: She appears to be euvolemic on exam and is doing well.  Continue lisinopril, lasix and reduce metoprolol as above.    # Hypertensive heart disease: Blood pressure is well-controlled today.  Given that we are reducing metoprolol due to bradycardia, we will increase lisinopril to 5 mg daily.  Current medicines are reviewed at length with the patient today.  The patient does not have concerns regarding medicines.  The following changes have been made:  Decrease metoprolol to 50 mg bid.  Increase lisinopril to 5 mg daily.  Stop aspirin on 4/17.   Labs/ tests ordered today include:  No orders of the defined types were placed in this encounter.     Disposition:   FU with Jurrell Royster C. Oval Linsey, MD, [facility] in 1 month    This note was written with the assistance of speech recognition software.  Please excuse any transcriptional errors.  Signed, Olajuwon Fosdick C. Oval Linsey, MD, [facility]  06/07/2015 11:10 AM    [place]

## 2015-06-08 ENCOUNTER — Encounter: Payer: Self-pay | Admitting: Cardiovascular Disease

## 2015-06-14 ENCOUNTER — Ambulatory Visit (INDEPENDENT_AMBULATORY_CARE_PROVIDER_SITE_OTHER): Payer: Medicare Other

## 2015-06-14 DIAGNOSIS — Z5181 Encounter for therapeutic drug level monitoring: Secondary | ICD-10-CM

## 2015-06-14 DIAGNOSIS — I4891 Unspecified atrial fibrillation: Secondary | ICD-10-CM | POA: Diagnosis not present

## 2015-06-14 DIAGNOSIS — Z7901 Long term (current) use of anticoagulants: Secondary | ICD-10-CM | POA: Diagnosis not present

## 2015-06-14 DIAGNOSIS — I4892 Unspecified atrial flutter: Secondary | ICD-10-CM

## 2015-06-14 LAB — POCT INR: INR: 1.9

## 2015-06-17 ENCOUNTER — Encounter: Payer: Self-pay | Admitting: Endocrinology

## 2015-06-17 ENCOUNTER — Ambulatory Visit (INDEPENDENT_AMBULATORY_CARE_PROVIDER_SITE_OTHER): Payer: Medicare Other | Admitting: Endocrinology

## 2015-06-17 VITALS — BP 132/70 | HR 59 | Temp 97.7°F | Ht 69.0 in | Wt 209.0 lb

## 2015-06-17 DIAGNOSIS — Z794 Long term (current) use of insulin: Secondary | ICD-10-CM | POA: Diagnosis not present

## 2015-06-17 DIAGNOSIS — I255 Ischemic cardiomyopathy: Secondary | ICD-10-CM

## 2015-06-17 DIAGNOSIS — E1040 Type 1 diabetes mellitus with diabetic neuropathy, unspecified: Secondary | ICD-10-CM

## 2015-06-17 DIAGNOSIS — E11319 Type 2 diabetes mellitus with unspecified diabetic retinopathy without macular edema: Secondary | ICD-10-CM

## 2015-06-17 LAB — MICROALBUMIN / CREATININE URINE RATIO
Creatinine,U: 54.9 mg/dL
MICROALB/CREAT RATIO: 6.4 mg/g (ref 0.0–30.0)
Microalb, Ur: 3.5 mg/dL — ABNORMAL HIGH (ref 0.0–1.9)

## 2015-06-17 LAB — POCT GLYCOSYLATED HEMOGLOBIN (HGB A1C): Hemoglobin A1C: 6.3

## 2015-06-17 NOTE — Patient Instructions (Addendum)
Please come back for a follow-up appointment in 3 months.   check your blood sugar twice a day.  vary the time of day when you check, between before the 3 meals, and at bedtime.  also check if you have symptoms of your blood sugar being too high or too low.  please keep a record of the readings and bring it to your next appointment here.  please call us sooner if your blood sugar goes below 70, or if you have a lot of readings over 200.   Please reduce the insulin to 25 units with breakfast (if you are going to be active that day, take just 15 units that morning), and 5 units with the evening meal.

## 2015-06-17 NOTE — Progress Notes (Signed)
Subjective:    Patient ID: [name] [name], female    DOB: [birth year], 75 y.o.   MRN: [id number]  HPI Pt returns for f/u of diabetes mellitus: DM type: Insulin-requiring type 2 Dx'ed: 0000000 Complications: polyneuropathy, CAD, and retinopathy.  Therapy: insulin since soon after dx.  GDM: never DKA: never Severe hypoglycemia: last episode was in 2016. Pancreatitis: never.   Other: she is on BID premixed insulin, due to noncompliance with multiple daily injections.  She takes human insulin, due to cost.  Interval history: She takes 30 units qam and 10 units qpm.  Since then, cbg's have been well-controlled.  There is no trend throughout the day.  Past Medical History  Diagnosis Date  . Blindness of left eye   . Eye muscle weakness     Right eye weakness after cataract surgery  . Common migraine     ?hx of  . DIABETES MELLITUS, TYPE II 03/27/2006    dr [name]  . HYPERLIPIDEMIA 03/27/2006  . 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 HYPERTENSION 03/27/2006  . Atrial fibrillation ([place])     SVT dx 2007, cath 2007 mild CAD, then had an cardioversion, ablation; still on coumadin , has occ palpitation, EKG 03-2010 NSR  . Headache(784.0) 05/07/2009  . LUNG NODULE 09/01/2006    Excision, Bx Benighn  . Osteopenia 2004    Dexa 2004 showed Osteopenia, DEXA 03/2007 normal  . Seizures ([place])   . Intertrochanteric fracture of right hip ([place]) 07/13/2012  . 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 GERD (gastroesophageal reflux disease) 10/05/2011  . Recurrent urinary tract infection     Seeing Urology  . 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[place]) [id number]    Past Surgical History  Procedure Laterality Date  . Shoulder surgery    . Femur im nail Right 07/15/2012    Procedure: INTRAMEDULLARY (IM) NAIL HIP;  Surgeon: Johnny Bridge, MD;  Location: [facility];  Service: Orthopedics;  Laterality: Right;  . Cardiac catheterization N/A 05/17/2015    Procedure: Left Heart Cath and Coronary Angiography;  Surgeon: Jettie Booze, MD;   Location: [facility];  Service: Cardiovascular;  Laterality: N/A;  . Cardiac catheterization N/A 05/17/2015    Procedure: Coronary Balloon Angioplasty;  Surgeon: Jettie Booze, MD;  Location: [facility];  Service: Cardiovascular;  Laterality: N/A;    Social History   Social History  . Marital Status: Married    Spouse Name: Jenny Reichmann  . Number of Children: 1  . Years of Education: College   Occupational History  . retired     Social History Main Topics  . Smoking status: Former Smoker    Types: Cigarettes    Quit date: 03/02/1978  . Smokeless tobacco: Never Used  . Alcohol Use: No  . Drug Use: No  . Sexual Activity: No   Other Topics Concern  . Not on file   Social History Narrative   Patient lives at home spouse.   Moved from [place] 2004   1 son, problems w/ drugs, at home         Current Outpatient Prescriptions on File Prior to Visit  Medication Sig Dispense Refill  . aspirin 81 MG chewable tablet Chew 1 tablet (81 mg total) by mouth daily. 30 tablet 3  . atorvastatin (LIPITOR) 40 MG tablet Take 2 tablets (80 mg total) by mouth at bedtime. 60 tablet 3  . betamethasone dipropionate (DIPROLENE) 0.05 % cream Apply topically as needed.    . calcium-vitamin D (OSCAL WITH D)  500-200 MG-UNIT per tablet Take 1 tablet by mouth daily.    . clopidogrel (PLAVIX) 75 MG tablet Take 1 tablet (75 mg total) by mouth daily. 30 tablet 4  . escitalopram (LEXAPRO) 10 MG tablet Take 2 tablets (20 mg total) by mouth daily. 90 tablet 2  . furosemide (LASIX) 40 MG tablet Take 1 tablet (40 mg total) by mouth 2 (two) times daily. 60 tablet 3  . lisinopril (PRINIVIL,ZESTRIL) 5 MG tablet Take 1 tablet (5 mg total) by mouth daily. 30 tablet 5  . LORazepam (ATIVAN) 0.5 MG tablet Take 0.5 tablets (0.25 mg total) by mouth every 8 (eight) hours as needed for anxiety. 20 tablet 0  . Multiple Vitamins-Minerals (CENTRUM SILVER ADULT 50+ PO) Take 1 tablet by mouth every morning.    .  nitroGLYCERIN (NITROSTAT) 0.4 MG SL tablet Place 1 tablet (0.4 mg total) under the tongue every 5 (five) minutes as needed for chest pain. 30 tablet 12  . phenytoin (DILANTIN) 100 MG ER capsule Take 3 capsules (300 mg total) by mouth daily. 90 capsule 5  . promethazine (PHENERGAN) 12.5 MG tablet Take 1 tablet (12.5 mg total) by mouth every 6 (six) hours as needed for nausea or vomiting. 30 tablet 0  . saccharomyces boulardii (FLORASTOR) 250 MG capsule Take 1 capsule (250 mg total) by mouth 2 (two) times daily. 30 capsule 0  . warfarin (COUMADIN) 2.5 MG tablet Take 1.25 mg by mouth daily.     No current facility-administered medications on file prior to visit.    Allergies  Allergen Reactions  . Procaine Hcl Anaphylaxis    Family History  Problem Relation Age of Onset  . Diabetes Father   . Heart attack Father 39  . Diabetes Sister   . Cancer Neg Hx     no hx of colon or breast cancer    BP 132/70 mmHg  Pulse 59  Temp(Src) 97.7 F (36.5 C) (Oral)  Ht 5\' 9"  (1.753 m)  Wt 209 lb (94.802 kg)  BMI 30.85 kg/m2  SpO2 93%   Review of Systems Denies weight change    Objective:   Physical Exam VITAL SIGNS:  See vs page GENERAL: no distress Pulses: dorsalis pedis intact bilat.   MSK: no deformity of the feet CV: no leg edema Skin:  no ulcer on the feet.  normal color and temp on the feet. Neuro: sensation is intact to touch on the feet.    A1c=6.3%    Assessment & Plan:  DM: overcontrolled, given this regimen, which does match insulin to her changing needs throughout the day.    Patient is advised the following: Patient Instructions  Please come back for a follow-up appointment in 3 months.   check your blood sugar twice a day.  vary the time of day when you check, between before the 3 meals, and at bedtime.  also check if you have symptoms of your blood sugar being too high or too low.  please keep a record of the readings and bring it to your next appointment here.   please call us sooner if your blood sugar goes below 70, or if you have a lot of readings over 200.   Please reduce the insulin to 25 units with breakfast (if you are going to be active that day, take just 15 units that morning), and 5 units with the evening meal.

## 2015-06-28 ENCOUNTER — Ambulatory Visit: Payer: Medicare Other | Admitting: Internal Medicine

## 2015-07-01 ENCOUNTER — Ambulatory Visit (INDEPENDENT_AMBULATORY_CARE_PROVIDER_SITE_OTHER): Payer: Medicare Other | Admitting: *Deleted

## 2015-07-01 DIAGNOSIS — I4891 Unspecified atrial fibrillation: Secondary | ICD-10-CM | POA: Diagnosis not present

## 2015-07-01 DIAGNOSIS — Z5181 Encounter for therapeutic drug level monitoring: Secondary | ICD-10-CM | POA: Diagnosis not present

## 2015-07-01 DIAGNOSIS — I4892 Unspecified atrial flutter: Secondary | ICD-10-CM | POA: Diagnosis not present

## 2015-07-01 DIAGNOSIS — Z7901 Long term (current) use of anticoagulants: Secondary | ICD-10-CM

## 2015-07-01 LAB — POCT INR: INR: 1.2

## 2015-07-08 ENCOUNTER — Ambulatory Visit (INDEPENDENT_AMBULATORY_CARE_PROVIDER_SITE_OTHER): Payer: Medicare Other | Admitting: *Deleted

## 2015-07-08 ENCOUNTER — Other Ambulatory Visit: Payer: Self-pay | Admitting: Neurology

## 2015-07-08 DIAGNOSIS — I4891 Unspecified atrial fibrillation: Secondary | ICD-10-CM | POA: Diagnosis not present

## 2015-07-08 DIAGNOSIS — Z7901 Long term (current) use of anticoagulants: Secondary | ICD-10-CM | POA: Diagnosis not present

## 2015-07-08 DIAGNOSIS — I4892 Unspecified atrial flutter: Secondary | ICD-10-CM

## 2015-07-08 DIAGNOSIS — Z5181 Encounter for therapeutic drug level monitoring: Secondary | ICD-10-CM

## 2015-07-08 LAB — POCT INR: INR: 1.1

## 2015-07-09 ENCOUNTER — Telehealth: Payer: Self-pay | Admitting: Cardiovascular Disease

## 2015-07-12 ENCOUNTER — Ambulatory Visit: Payer: Medicare Other | Admitting: Cardiovascular Disease

## 2015-07-12 NOTE — Telephone Encounter (Signed)
Closed encounter °

## 2015-07-15 ENCOUNTER — Ambulatory Visit (INDEPENDENT_AMBULATORY_CARE_PROVIDER_SITE_OTHER): Payer: Medicare Other | Admitting: *Deleted

## 2015-07-15 DIAGNOSIS — I4891 Unspecified atrial fibrillation: Secondary | ICD-10-CM

## 2015-07-15 DIAGNOSIS — Z5181 Encounter for therapeutic drug level monitoring: Secondary | ICD-10-CM | POA: Diagnosis not present

## 2015-07-15 DIAGNOSIS — Z7901 Long term (current) use of anticoagulants: Secondary | ICD-10-CM | POA: Diagnosis not present

## 2015-07-15 DIAGNOSIS — I4892 Unspecified atrial flutter: Secondary | ICD-10-CM

## 2015-07-15 LAB — POCT INR: INR: 2

## 2015-07-19 ENCOUNTER — Telehealth: Payer: Self-pay

## 2015-07-19 MED ORDER — PROMETHAZINE HCL 12.5 MG PO TABS: 12.5000 mg | ORAL_TABLET | Freq: Four times a day (QID) | ORAL | Status: DC | PRN

## 2015-07-19 NOTE — Telephone Encounter (Signed)
Rx sent 

## 2015-07-19 NOTE — Telephone Encounter (Signed)
Pt is requesting refill on Promethazine 12.5mg  tablet.   Last OV:05/21/2015 Last Fill: 05/21/2015 #30 and 0RF Dr. Tana Coast   Please advise.

## 2015-07-19 NOTE — Telephone Encounter (Signed)
Okay #30, no refills. Will discussed the need to keep taking Phenergan when she comes back

## 2015-07-22 ENCOUNTER — Other Ambulatory Visit: Payer: Self-pay | Admitting: Endocrinology

## 2015-07-24 LAB — HM DIABETES EYE EXAM

## 2015-08-01 ENCOUNTER — Ambulatory Visit (INDEPENDENT_AMBULATORY_CARE_PROVIDER_SITE_OTHER): Payer: Medicare Other | Admitting: Cardiovascular Disease

## 2015-08-01 ENCOUNTER — Encounter: Payer: Self-pay | Admitting: Cardiovascular Disease

## 2015-08-01 ENCOUNTER — Ambulatory Visit (INDEPENDENT_AMBULATORY_CARE_PROVIDER_SITE_OTHER): Payer: Medicare Other | Admitting: Pharmacist

## 2015-08-01 VITALS — BP 114/58 | HR 74 | Ht 69.0 in | Wt 202.6 lb

## 2015-08-01 DIAGNOSIS — I255 Ischemic cardiomyopathy: Secondary | ICD-10-CM

## 2015-08-01 DIAGNOSIS — I4891 Unspecified atrial fibrillation: Secondary | ICD-10-CM

## 2015-08-01 DIAGNOSIS — Z5181 Encounter for therapeutic drug level monitoring: Secondary | ICD-10-CM | POA: Diagnosis not present

## 2015-08-01 DIAGNOSIS — I480 Paroxysmal atrial fibrillation: Secondary | ICD-10-CM | POA: Diagnosis not present

## 2015-08-01 DIAGNOSIS — Z79899 Other long term (current) drug therapy: Secondary | ICD-10-CM

## 2015-08-01 DIAGNOSIS — E785 Hyperlipidemia, unspecified: Secondary | ICD-10-CM

## 2015-08-01 DIAGNOSIS — R110 Nausea: Secondary | ICD-10-CM | POA: Diagnosis not present

## 2015-08-01 DIAGNOSIS — R300 Dysuria: Secondary | ICD-10-CM | POA: Diagnosis not present

## 2015-08-01 DIAGNOSIS — I2510 Atherosclerotic heart disease of native coronary artery without angina pectoris: Secondary | ICD-10-CM

## 2015-08-01 DIAGNOSIS — I4892 Unspecified atrial flutter: Secondary | ICD-10-CM

## 2015-08-01 DIAGNOSIS — Z7901 Long term (current) use of anticoagulants: Secondary | ICD-10-CM

## 2015-08-01 DIAGNOSIS — I5042 Chronic combined systolic (congestive) and diastolic (congestive) heart failure: Secondary | ICD-10-CM

## 2015-08-01 DIAGNOSIS — I2583 Coronary atherosclerosis due to lipid rich plaque: Secondary | ICD-10-CM

## 2015-08-01 DIAGNOSIS — I110 Hypertensive heart disease with heart failure: Secondary | ICD-10-CM

## 2015-08-01 LAB — CBC WITH DIFFERENTIAL/PLATELET
Basophils Absolute: 0 cells/uL (ref 0–200)
Basophils Relative: 0 %
EOS PCT: 1 %
Eosinophils Absolute: 106 cells/uL (ref 15–500)
HCT: 33 % — ABNORMAL LOW (ref 35.0–45.0)
Hemoglobin: 10.8 g/dL — ABNORMAL LOW (ref 11.7–15.5)
LYMPHS PCT: 22 %
Lymphs Abs: 2332 cells/uL (ref 850–3900)
MCH: 29.2 pg (ref 27.0–33.0)
MCHC: 32.7 g/dL (ref 32.0–36.0)
MCV: 89.2 fL (ref 80.0–100.0)
MONOS PCT: 13 %
MPV: 9.6 fL (ref 7.5–12.5)
Monocytes Absolute: 1378 cells/uL — ABNORMAL HIGH (ref 200–950)
NEUTROS ABS: 6784 {cells}/uL (ref 1500–7800)
Neutrophils Relative %: 64 %
PLATELETS: 444 10*3/uL — AB (ref 140–400)
RBC: 3.7 MIL/uL — AB (ref 3.80–5.10)
RDW: 14.4 % (ref 11.0–15.0)
WBC: 10.6 10*3/uL (ref 3.8–10.8)

## 2015-08-01 LAB — POCT INR: INR: 1.8

## 2015-08-01 MED ORDER — METOPROLOL SUCCINATE ER 50 MG PO TB24: ORAL_TABLET | ORAL | Status: DC

## 2015-08-01 NOTE — Progress Notes (Signed)
Cardiology Office Note   Date:  08/02/2015   ID:  [name] [name], [name] [name] [birth year], MRN [id number]  PCP:  [name], MD  Cardiologist:   Skeet Latch, MD   Chief Complaint  Patient presents with  . Follow-up    1 month (PAF)  pt c/o vomitting and fatigue--low energy, sleeping good     History of Present Illness: [name] [name] is a 75 y.o. 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  At her last appointment she was noted to be bradycardic into the 40s. Metoprolol was reduced to 50 mg twice a day.  Her lisinopril was increased to 5 mg daily.  Since she was last seen [name] has been doing well.  She denies any chest pain or shortness of breath.  Her main complaint has been fatigue.  She doesn't have the energy to do anything around the house.  She also notes muliple episodes of nausea and emesis.  She has been throwing up for the last month.  The last time this occurred she had a UTI.  She denies any fever, chills, dysuria or foul-smelling urine.  She also has not noticed any lower extremity edema, orthopnea or PND.   Past Medical History  Diagnosis Date  . Blindness of left eye   . Eye muscle weakness     Right eye weakness after cataract surgery  . Common migraine     ?hx of  . DIABETES MELLITUS, TYPE II 03/27/2006    dr [name]  . HYPERLIPIDEMIA 03/27/2006  . 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 HYPERTENSION 03/27/2006  . Atrial fibrillation ([place])     SVT dx 2007, cath 2007 mild CAD, then had an cardioversion, ablation; still on coumadin , has occ palpitation, EKG 03-2010 NSR  . Headache(784.0) 05/07/2009  . LUNG NODULE 09/01/2006    Excision, Bx Benighn  . Osteopenia 2004    Dexa 2004 showed Osteopenia, DEXA 03/2007 normal  . Seizures ([place])   . Intertrochanteric fracture of right  hip ([place]) 07/13/2012  . 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 GERD (gastroesophageal reflux disease) 10/05/2011  . Recurrent urinary tract infection     Seeing Urology  . 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[place]) Q000111Q    Past Surgical History  Procedure Laterality Date  . Shoulder surgery    . Femur im nail Right 07/15/2012    Procedure: INTRAMEDULLARY (IM) NAIL HIP;  Surgeon: Johnny Bridge, MD;  Location: [facility];  Service: Orthopedics;  Laterality: Right;  . Cardiac catheterization N/A 05/17/2015    Procedure: Left Heart Cath and Coronary Angiography;  Surgeon: Jettie Booze, MD;  Location: [facility];  Service: Cardiovascular;  Laterality: N/A;  . Cardiac catheterization N/A 05/17/2015    Procedure: Coronary Balloon Angioplasty;  Surgeon: Jettie Booze, MD;  Location: [facility];  Service: Cardiovascular;  Laterality: N/A;     Current Outpatient Prescriptions  Medication Sig Dispense Refill  . aspirin 81 MG tablet Take 81 mg by mouth daily.    [name] atorvastatin (LIPITOR) 40 MG tablet Take 2 tablets (80 mg total) by mouth at bedtime. 60 tablet 3  . calcium-vitamin D (OSCAL WITH D) 500-200 MG-UNIT per tablet Take 1 tablet by mouth daily.    . clopidogrel (PLAVIX) 75 MG tablet Take 1 tablet (75 mg total) by mouth daily. 30 tablet 4  .  escitalopram (LEXAPRO) 10 MG tablet TAKE TWO TABLETS BY MOUTH ONCE DAILY 180 tablet 3  . furosemide (LASIX) 40 MG tablet Take 1 tablet (40 mg total) by mouth 2 (two) times daily. 60 tablet 3  . insulin NPH-regular Human (NOVOLIN 70/30) (70-30) 100 UNIT/ML injection Inject into the skin. 30 Units in the morning; 12 units in the evening.    [name] lisinopril (PRINIVIL,ZESTRIL) 5 MG tablet Take 1 tablet (5 mg total) by mouth daily. 30 tablet 5  . LORazepam (ATIVAN) 0.5 MG tablet Take 0.5 tablets (0.25 mg total) by mouth every 8 (eight) hours as needed for anxiety. 20 tablet 0  . Multiple Vitamins-Minerals (CENTRUM SILVER ADULT 50+ PO) Take 1 tablet by mouth every  morning.    . nitroGLYCERIN (NITROSTAT) 0.4 MG SL tablet Place 1 tablet (0.4 mg total) under the tongue every 5 (five) minutes as needed for chest pain. 30 tablet 12  . phenytoin (DILANTIN) 100 MG ER capsule Take 3 capsules (300 mg total) by mouth daily. 90 capsule 5  . promethazine (PHENERGAN) 12.5 MG tablet Take 1 tablet (12.5 mg total) by mouth every 6 (six) hours as needed for nausea or vomiting. 30 tablet 0  . saccharomyces boulardii (FLORASTOR) 250 MG capsule Take 1 capsule (250 mg total) by mouth 2 (two) times daily. 30 capsule 0  . warfarin (COUMADIN) 2.5 MG tablet Take 1.25 mg by mouth daily.    . metoprolol succinate (TOPROL-XL) 50 MG 24 hr tablet Take 1 tablet by mouth in the evening. Take with or immediately following a meal. 30 tablet 5   No current facility-administered medications for this visit.    Allergies:   Procaine hcl    Social History:  The patient  reports that she quit smoking about 37 years ago. Her smoking use included Cigarettes. She has never used smokeless tobacco. She reports that she does not drink alcohol or use illicit drugs.   Family History:  The patient's  family history includes Diabetes in her father and sister; Heart attack (age of onset: 70) in her father. There is no history of Cancer.    ROS:  Please see the history of present illness.   Otherwise, review of systems are positive for none.   All other systems are reviewed and negative.    PHYSICAL EXAM: VS:  BP 114/58 mmHg  Pulse 74  Ht 5\' 9"  (1.753 m)  Wt 202 lb 9.6 oz (91.899 kg)  BMI 29.91 kg/m2 , BMI Body mass index is 29.91 kg/(m^2). GENERAL:  Well appearing HEENT:  Pupils equal round and reactive, fundi not visualized, oral mucosa unremarkable NECK:  No jugular venous distention, waveform within normal limits, carotid upstroke brisk and symmetric, no bruits LYMPHATICS:  No cervical adenopathy LUNGS:  Clear to auscultation bilaterally HEART:  RRR.  PMI not displaced or sustained,S1 and  S2 within normal limits, no S3, no S4, no clicks, no rubs, no murmurs ABD:  Flat, positive bowel sounds normal in frequency in pitch, no bruits, no rebound, no guarding, no midline pulsatile mass, no hepatomegaly, no splenomegaly EXT:  2 plus pulses throughout, no edema, no cyanosis no clubbing SKIN:  No rashes no nodules NEURO:  Cranial nerves II through XII grossly intact, motor grossly intact throughout PSYCH:  Cognitively intact, oriented to person place and time  EKG:  EKG is ordered today. The ekg ordered today demonstrates sinus Rhythm. Rate 74 bpm. Lateral T-wave inversions. Unchanged from prior, no bradycardia has resolved  Echo 05/10/15: Study Conclusions  -  Left ventricle: The cavity size was normal. There was moderate  concentric hypertrophy. Systolic function was moderately reduced.  The estimated ejection fraction was in the range of 35% to 40%.  Diffuse hypokinesis. The study was not technically sufficient to  allow evaluation of LV diastolic dysfunction due to atrial  fibrillation. - Aortic valve: Trileaflet; normal thickness leaflets. There was no  regurgitation. - Aortic root: The aortic root was normal in size. - Mitral valve: Mildly thickened leaflets . There was mild  regurgitation. - Left atrium: The atrium was moderately dilated. - Right ventricle: The cavity size was normal. Wall thickness was  normal. Systolic function was normal. - Right atrium: The atrium was normal in size. - Tricuspid valve: There was moderate regurgitation. - Pulmonic valve: There was no regurgitation. - Pulmonary arteries: Systolic pressure was mildly to moderately  increased. PA peak pressure: 45 mm Hg (S). - Inferior vena cava: The vessel was normal in size. - Pericardium, extracardiac: There was no pericardial effusion.  LHC 05/17/15:  There is moderate left ventricular systolic dysfunction.  1st Mrg lesion, 100% stenosed. Intervention was performed with balloon  angioplasty. No stent was placed.  At the end of the procedure, the initial occlusion site had no residual stenosis. There did remain a large branch downstream of the initial occlusion which remained occluded and did not respond to angioplasty. There is a smaller branch which had TIMI-3 flow restored. The patient was pain-free at the end of the procedure.  Moderate LV dysfunction. Moderately elevated LVEDP.  Recent Labs: 05/06/2015: TSH 3.211 05/20/2015: B Natriuretic Peptide 1570.5* 08/01/2015: ALT 7; BUN 27*; Creat 1.73*; Hemoglobin 10.8*; Platelets 444*; Potassium 4.1; Sodium 134*    Lipid Panel    Component Value Date/Time   CHOL 167 01/09/2014 0808   TRIG 100.0 01/09/2014 0808   HDL 48.70 01/09/2014 0808   CHOLHDL 3 01/09/2014 0808   VLDL 20.0 01/09/2014 0808   LDLCALC 98 01/09/2014 0808      Wt Readings from Last 3 Encounters:  08/01/15 202 lb 9.6 oz (91.899 kg)  06/17/15 209 lb (94.802 kg)  06/07/15 210 lb (95.255 kg)      ASSESSMENT AND PLAN:  # CAD s/p NSTEMI: Ms. Bagnall is doing well and denies recurrent chest pain.  Continue aspirin, Plavix, metoprolol and atorvastatin.  Stop aspirin and continue Plavix and coumadin only.   # Atrial fibrillation: She is currently in sinus rhythm and has not noted any recurrent palpitations.  Her heart rate has improved with the reduction in her metoprolol. Continue warfarin. This patients CHA2DS2-VASc Score and unadjusted Ischemic Stroke Rate (% per year) is equal to 9.7 % stroke rate/year from a score of 6  Above score calculated as 1 point each if present [CHF, HTN, DM, Vascular=MI/PAD/Aortic Plaque, Age if 65-74, or Female] Above score calculated as 2 points each if present [Age > 75, or Stroke/TIA/TE]  # Chronic systolic and diastolic heart failure: She appears to be euvolemic on exam and is doing well.  Continue lisinopril, lasix and metoprolol as above.    # Hypertensive heart disease: Blood pressure is well-controlled today.   Continue metoprolol and lisinopril.  # Nausea: Ms. Lupercio reports nausea and emesis lately. The last time this happened she had a urinary tract infection. We will check a urinalysis and a CBC today. If this is within normal limits and he recommended that she follow-up with a gastroenterologist.  Current medicines are reviewed at length with the patient today.  The patient does not  have concerns regarding medicines.  The following changes have been made:  Stop aspirin Labs/ tests ordered today include:   Orders Placed This Encounter  Procedures  . CBC with Differential/Platelet  . Comprehensive Metabolic Panel (CMET)  . Urinalysis  . EKG 12-Lead     Disposition:   FU with Kiernan Farkas C. Oval Linsey, MD, [facility] in 6 months.    This note was written with the assistance of speech recognition software.  Please excuse any transcriptional errors.  Signed, Lakendria Nicastro C. Oval Linsey, MD, [facility]  08/02/2015 5:43 PM    [place]

## 2015-08-01 NOTE — Patient Instructions (Addendum)
Medication Instructions:  STOP METOPROLOL TART  START METOPROLOL SUCC 50 MG DAILY AT NIGHT  Labwork: CBC/CMET/URINALYSIS AT [facility] ON THE FIRST FLOOR  Testing/Procedures: NONE  Follow-Up: Your physician wants you to follow-up in: [place] will receive a reminder letter in the mail two months in advance. If you don't receive a letter, please call our office to schedule the follow-up appointment.  If you need a refill on your cardiac medications before your next appointment, please call your pharmacy.

## 2015-08-02 ENCOUNTER — Telehealth: Payer: Self-pay | Admitting: *Deleted

## 2015-08-02 ENCOUNTER — Encounter: Payer: Self-pay | Admitting: Internal Medicine

## 2015-08-02 LAB — COMPREHENSIVE METABOLIC PANEL
ALK PHOS: 134 U/L — AB (ref 33–130)
ALT: 7 U/L (ref 6–29)
AST: 13 U/L (ref 10–35)
Albumin: 3.4 g/dL — ABNORMAL LOW (ref 3.6–5.1)
BILIRUBIN TOTAL: 0.4 mg/dL (ref 0.2–1.2)
BUN: 27 mg/dL — AB (ref 7–25)
CO2: 29 mmol/L (ref 20–31)
CREATININE: 1.73 mg/dL — AB (ref 0.60–0.93)
Calcium: 9 mg/dL (ref 8.6–10.4)
Chloride: 91 mmol/L — ABNORMAL LOW (ref 98–110)
GLUCOSE: 198 mg/dL — AB (ref 65–99)
POTASSIUM: 4.1 mmol/L (ref 3.5–5.3)
SODIUM: 134 mmol/L — AB (ref 135–146)
TOTAL PROTEIN: 7.1 g/dL (ref 6.1–8.1)

## 2015-08-02 LAB — URINALYSIS
BILIRUBIN URINE: NEGATIVE
KETONES UR: NEGATIVE
Nitrite: NEGATIVE
PROTEIN: NEGATIVE
Specific Gravity, Urine: 1.011 (ref 1.001–1.035)
pH: 5.5 (ref 5.0–8.0)

## 2015-08-02 NOTE — Telephone Encounter (Signed)
Per Dr Oval Linsey patient needs to d/c ASA Called and advised patient, verbalized understanding

## 2015-08-05 ENCOUNTER — Telehealth: Payer: Self-pay | Admitting: Cardiology

## 2015-08-05 ENCOUNTER — Telehealth: Payer: Self-pay | Admitting: *Deleted

## 2015-08-05 ENCOUNTER — Telehealth: Payer: Self-pay | Admitting: Internal Medicine

## 2015-08-05 ENCOUNTER — Telehealth: Payer: Self-pay | Admitting: Cardiovascular Disease

## 2015-08-05 MED ORDER — CIPROFLOXACIN HCL 250 MG PO TABS: 250.0000 mg | ORAL_TABLET | Freq: Two times a day (BID) | ORAL | Status: DC

## 2015-08-05 NOTE — Telephone Encounter (Signed)
-----   Message from Skeet Latch, MD sent at 08/04/2015 10:38 PM EDT ----- Labs are consistent with a UTI.  Ciprofloxacin 250 mg q12h x10 days.  Follow up with PCP.

## 2015-08-05 NOTE — Telephone Encounter (Signed)
Follow up ° ° °Pt is returning call for rn  °

## 2015-08-05 NOTE — Telephone Encounter (Signed)
Called by patient's husband who stated that patient was vomiting and recently took a dose of Cipro. Patient's husband stated this happened the last time the pt. Took Cipro. Advised him to stop Cipro and call PCP to get alternative antibiotic for UTI.

## 2015-08-05 NOTE — Telephone Encounter (Signed)
Spoke with pt's husband and this nurse instructed that Cipro and coumadin do interact and we need to check her INR on Thursday June 8th. Pt's husband instructed to have pt get the RX for cipro and start that today and continue the Cipro as ordered and take  her coumadin as ordered and appt made to be seen in coumadin clinic  for recheck INR on June 8th Pt's husband verbalized understanding

## 2015-08-05 NOTE — Telephone Encounter (Signed)
Left message to call back  

## 2015-08-05 NOTE — Telephone Encounter (Signed)
Follow up  ° ° °Patient returning call back to nurse  °

## 2015-08-05 NOTE — Telephone Encounter (Signed)
Spoke with pt and she states she is starting Cipro 250 mg bid for 10 days for UTI  Today June 5th.  Pt instructed that coumadin and Cipro can interact with each other so will need to recheck her INR on Thursday . Pt states she will have to check with her husband and will call us back

## 2015-08-05 NOTE — Telephone Encounter (Signed)
Spoke to patient. Result given . Verbalized understanding Aware to follow up with primary  E sent CIPRO 250 mg b.i.d for 10 days to pharmacy  Forward to anticoag. pool at [address]- for there information- since dose is followed there.

## 2015-08-05 NOTE — Telephone Encounter (Signed)
Spoke with husband and he thought patient needed follow up with Dr Oval Linsey Advised patient needed follow up on UTI with PCP Per husband patient sees Dr Alyson Ingles urologist for UTI issues.  Advised husband to follow up with him then Stated he would call office now to schedule follow up ov.  Will send U/A to Dr Alyson Ingles

## 2015-08-05 NOTE — Telephone Encounter (Signed)
Caller name: Mr. Proefrock  Relation to [id number]  Call back number: [phone]  Pharmacy: [facility] [place] [phone] (Phone) [phone] (Fax)         Reason for call:  Dr. Oval Linsey prescribed ciprofloxacin (CIPRO) 250 MG tablet due to patient having UTI and patient has been throwing up. Patient in need of clinincal advice what medication PCP would prescribeh, MD

## 2015-08-06 NOTE — Telephone Encounter (Signed)
Thank you, will see her tomorrow

## 2015-08-06 NOTE — Telephone Encounter (Signed)
Please advise 

## 2015-08-06 NOTE — Telephone Encounter (Signed)
Called pt's husband, informed him of below. He verbalized understanding. When signing off on Bactrim, warning for interaction w/ coumadin popped up. Discussed w/ Dr. Larose Kells who stated to have pt hold off on atbx for now unless fever, chills, etc and he will assess need for treatment at OV tomorrow. Returned call to pt's husband and informed him to hold atbx until appt tomorrow. He states pt has coumadin clinic appt on Friday of this week. Pt has no fever, chills, abd pain.

## 2015-08-06 NOTE — Telephone Encounter (Signed)
Chart reviewed: She was seen by cardiology a few days ago, had nausea and then. Urinalysis showed nitrites, no urine culture. Possible UTI, unable to obtain a urine culture at this point due to her some Cipro.  Plan: Discontinue Cipro, Bactrim DS twice a day for 3 days, needs evaluation for nausea. Schedule appointment, nausea may not be related to UTI

## 2015-08-07 ENCOUNTER — Encounter: Payer: Self-pay | Admitting: Internal Medicine

## 2015-08-07 ENCOUNTER — Ambulatory Visit (INDEPENDENT_AMBULATORY_CARE_PROVIDER_SITE_OTHER): Payer: Medicare Other | Admitting: Internal Medicine

## 2015-08-07 VITALS — BP 118/74 | HR 73 | Temp 97.6°F | Ht 69.0 in | Wt 202.6 lb

## 2015-08-07 DIAGNOSIS — D649 Anemia, unspecified: Secondary | ICD-10-CM | POA: Diagnosis not present

## 2015-08-07 DIAGNOSIS — R110 Nausea: Secondary | ICD-10-CM | POA: Diagnosis not present

## 2015-08-07 DIAGNOSIS — I255 Ischemic cardiomyopathy: Secondary | ICD-10-CM | POA: Diagnosis not present

## 2015-08-07 DIAGNOSIS — N390 Urinary tract infection, site not specified: Secondary | ICD-10-CM | POA: Diagnosis not present

## 2015-08-07 DIAGNOSIS — R5383 Other fatigue: Secondary | ICD-10-CM | POA: Diagnosis not present

## 2015-08-07 LAB — URINALYSIS, ROUTINE W REFLEX MICROSCOPIC
Bilirubin Urine: NEGATIVE
Ketones, ur: NEGATIVE
Nitrite: NEGATIVE
PH: 6 (ref 5.0–8.0)
SPECIFIC GRAVITY, URINE: 1.01 (ref 1.000–1.030)
TOTAL PROTEIN, URINE-UPE24: NEGATIVE
URINE GLUCOSE: 100 — AB
UROBILINOGEN UA: 0.2 (ref 0.0–1.0)

## 2015-08-07 MED ORDER — PANTOPRAZOLE SODIUM 40 MG PO TBEC: 40.0000 mg | DELAYED_RELEASE_TABLET | Freq: Every day | ORAL | Status: DC

## 2015-08-07 NOTE — Patient Instructions (Signed)
GO TO THE LAB : provide 5 a urine sample     GO TO THE FRONT DESK Schedule your next appointment for a  follow-up in 3 weeks  Take Phenergan for nausea Start taking Protonix every morning before breakfast for  acid reflux Call if you have persistent nausea Call if you have fever, chills, difficulty urinating.

## 2015-08-07 NOTE — Assessment & Plan Note (Signed)
UTI? H/o  increased PVR and is prone to urinary tract infections, Udip was c/w infex , no UCX, Rx Cipro but couldn't tolerate it. She is otherwise asx from the GU stand point . Plan: UA, urine culture (understanding that it may be false negative UCX d/t recent Abx) Nausea: Not persisting or frequent, mostly related to medications such as antibiotics GERD: Persistent sx, Rx PPIs  Anemia: Mild but stable anemia, iron and ferritin within normal 05-2015. From chronic disease?[name] Fatigue: Today reports fatigue,  likely multifactorial. Reassess on RTC Takes medication regularly except for the last few days due to nausea. Restart medications as usual ASAP RTC 3 weeks

## 2015-08-07 NOTE — Progress Notes (Signed)
Subjective:    Patient ID: [name] [name], female    DOB: 09-29-1940, 75 y.o.   MRN: [id number]  DOS:  08/07/2015 Type of visit - description : Acute visit Interval history: The patient went to see her cardiologist 08/01/2015, at the time she reported some nausea,  Udip was c/w infection, rx cipro empirically, developed severe nausea with the antibiotic, I then  rx Bactrim but eventually the prescription was never sent because of potential interaction w/ other meds. She is here for further evaluation. The patient reports to me that she is feeling quite fatigue for the last few months but denies persistent or nausea, "I just got very sick when I took that antibiotic". She has a history of urinary retention, is trying to do timed voiding. But at this point denies any dysuria or gross hematuria.(Her extensive urology history was reviewed and summarized below).    Review of Systems no fever or chills No blood in the stools or abdominal pain Admits to heartburn, persistent without dysphagia or odynophagia. Not taking PPIs.  Past Medical History  Diagnosis Date  . Blindness of left eye   . Eye muscle weakness     Right eye weakness after cataract surgery  . Common migraine     ?hx of  . DIABETES MELLITUS, TYPE II 03/27/2006    dr [name]  . HYPERLIPIDEMIA 03/27/2006  . 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 HYPERTENSION 03/27/2006  . Atrial fibrillation ([place])     SVT dx 2007, cath 2007 mild CAD, then had an cardioversion, ablation; still on coumadin , has occ palpitation, EKG 03-2010 NSR  . Headache(784.0) 05/07/2009  . LUNG NODULE 09/01/2006    Excision, Bx Benighn  . Osteopenia 2004    Dexa 2004 showed Osteopenia, DEXA 03/2007 normal  . Seizures ([place])   . Intertrochanteric fracture of right hip ([place]) 07/13/2012  . 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 GERD (gastroesophageal reflux disease) 10/05/2011  . Recurrent urinary tract infection     Seeing Urology  . 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[place]) [id number]     Past Surgical History  Procedure Laterality Date  . Shoulder surgery    . Femur im nail Right 07/15/2012    Procedure: INTRAMEDULLARY (IM) NAIL HIP;  Surgeon: Johnny Bridge, MD;  Location: [facility];  Service: Orthopedics;  Laterality: Right;  . Cardiac catheterization N/A 05/17/2015    Procedure: Left Heart Cath and Coronary Angiography;  Surgeon: Jettie Booze, MD;  Location: [facility];  Service: Cardiovascular;  Laterality: N/A;  . Cardiac catheterization N/A 05/17/2015    Procedure: Coronary Balloon Angioplasty;  Surgeon: Jettie Booze, MD;  Location: [facility];  Service: Cardiovascular;  Laterality: N/A;    Social History   Social History  . Marital Status: Married    Spouse Name: Jenny Reichmann  . Number of Children: 1  . Years of Education: College   Occupational History  . retired     Social History Main Topics  . Smoking status: Former Smoker    Types: Cigarettes    Quit date: 03/02/1978  . Smokeless tobacco: Never Used  . Alcohol Use: No  . Drug Use: No  . Sexual Activity: No   Other Topics Concern  . Not on file   Social History Narrative   Patient lives at home spouse.   Moved from [place] 2004   1 son, problems w/ drugs, at home             Medication List  This list is accurate as of: 08/07/15  6:27 PM.  Always use your most recent med list.               atorvastatin 40 MG tablet  Commonly known as:  LIPITOR  Take 2 tablets (80 mg total) by mouth at bedtime.     calcium-vitamin D 500-200 MG-UNIT tablet  Commonly known as:  OSCAL WITH D  Take 1 tablet by mouth daily.     CENTRUM SILVER ADULT 50+ PO  Take 1 tablet by mouth every morning. Reported on 08/07/2015     clopidogrel 75 MG tablet  Commonly known as:  PLAVIX  Take 1 tablet (75 mg total) by mouth daily.     escitalopram 10 MG tablet  Commonly known as:  LEXAPRO  TAKE TWO TABLETS BY MOUTH ONCE DAILY     furosemide 40 MG tablet  Commonly known as:  LASIX  Take  1 tablet (40 mg total) by mouth 2 (two) times daily.     insulin NPH-regular Human (70-30) 100 UNIT/ML injection  Commonly known as:  NOVOLIN 70/30  Inject into the skin. 30 Units in the morning; 12 units in the evening.     lisinopril 5 MG tablet  Commonly known as:  PRINIVIL,ZESTRIL  Take 1 tablet (5 mg total) by mouth daily.     LORazepam 0.5 MG tablet  Commonly known as:  ATIVAN  Take 0.5 tablets (0.25 mg total) by mouth every 8 (eight) hours as needed for anxiety.     metoprolol succinate 50 MG 24 hr tablet  Commonly known as:  TOPROL-XL  Take 1 tablet by mouth in the evening. Take with or immediately following a meal.     nitroGLYCERIN 0.4 MG SL tablet  Commonly known as:  NITROSTAT  Place 1 tablet (0.4 mg total) under the tongue every 5 (five) minutes as needed for chest pain.     pantoprazole 40 MG tablet  Commonly known as:  PROTONIX  Take 1 tablet (40 mg total) by mouth daily.     phenytoin 100 MG ER capsule  Commonly known as:  DILANTIN  Take 3 capsules (300 mg total) by mouth daily.     promethazine 12.5 MG tablet  Commonly known as:  PHENERGAN  Take 1 tablet (12.5 mg total) by mouth every 6 (six) hours as needed for nausea or vomiting.     saccharomyces boulardii 250 MG capsule  Commonly known as:  FLORASTOR  Take 1 capsule (250 mg total) by mouth 2 (two) times daily.     warfarin 2.5 MG tablet  Commonly known as:  COUMADIN  Take 1.25 mg by mouth daily.           Objective:   Physical Exam BP 118/74 mmHg  Pulse 73  Temp(Src) 97.6 F (36.4 C) (Oral)  Ht 5\' 9"  (1.753 m)  Wt 202 lb 9 oz (91.882 kg)  BMI 29.90 kg/m2  SpO2 95% General:   Well developed, well nourished . NAD.  HEENT:  Normocephalic . Face symmetric, atraumatic Lungs:  CTA B Normal respiratory effort, no intercostal retractions, no accessory muscle use. Heart: RRR,  no murmur.  no pretibial edema bilaterally  Abdomen:  Not distended, soft, non-tender. No rebound or rigidity. No  CVA tenderness Skin: Not pale. Not jaundice Neurologic:  alert & oriented X3.  Speech normal, gait appropriate for age and unassisted Psych--  Cognition and judgment appear intact.  Cooperative with normal attention span and concentration.  Behavior appropriate.  No anxious or depressed appearing.    Assessment & Plan:   Assessment > DM  + retinopathy, Dr [name] HTN Hyperlipidemia GERD CV ---NSTEMI 05-2015, cath, angiplasty ---Echo, EF 40 % (05-2015) ---Atrial fibrillation, SVT -- dx 2007, cath 2007 mild CAD. 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 H/o  increased PVR and is prone to urinary tract infections, Udip was c/w infex , no UCX, Rx Cipro but couldn't tolerate it. She is otherwise asx from the GU stand point . Plan: UA, urine culture (understanding that it may be false negative UCX d/t recent Abx) Nausea: Not persisting or frequent, mostly related to medications such as antibiotics GERD: Persistent sx, Rx PPIs  Anemia: Mild but stable anemia, iron and ferritin within normal 05-2015. From chronic disease?[name] Fatigue: Today reports fatigue,  likely multifactorial. Reassess on RTC Takes medication regularly except for the last few days due to nausea. Restart medications as usual ASAP RTC 3 weeks

## 2015-08-07 NOTE — Progress Notes (Signed)
Pre visit review using our clinic review tool, if applicable. No additional management support is needed unless otherwise documented below in the visit note. 

## 2015-08-08 LAB — URINE CULTURE: Colony Count: 60000

## 2015-08-12 ENCOUNTER — Ambulatory Visit: Payer: Medicare Other | Admitting: Cardiovascular Disease

## 2015-08-12 NOTE — Addendum Note (Signed)
Addended byDamita Dunnings D on: 08/12/2015 04:48 PM   Modules accepted: Orders

## 2015-08-16 ENCOUNTER — Telehealth: Payer: Self-pay | Admitting: Internal Medicine

## 2015-08-16 NOTE — Telephone Encounter (Signed)
See last OV. I spoke with the patient today, she is now asymptomatic. No UTI symptoms. No N-V The last urine culture was contaminated, I requested another sample however at this point she is asymptomatic. Treatment of asymptomatic UTI is controversial consequently we won't pursue a repeated urine culture. She has an appointment to see urology soon

## 2015-08-19 ENCOUNTER — Ambulatory Visit (INDEPENDENT_AMBULATORY_CARE_PROVIDER_SITE_OTHER): Payer: Medicare Other | Admitting: *Deleted

## 2015-08-19 DIAGNOSIS — I4891 Unspecified atrial fibrillation: Secondary | ICD-10-CM

## 2015-08-19 DIAGNOSIS — Z5181 Encounter for therapeutic drug level monitoring: Secondary | ICD-10-CM

## 2015-08-19 DIAGNOSIS — Z7901 Long term (current) use of anticoagulants: Secondary | ICD-10-CM | POA: Diagnosis not present

## 2015-08-19 DIAGNOSIS — I4892 Unspecified atrial flutter: Secondary | ICD-10-CM | POA: Diagnosis not present

## 2015-08-19 LAB — POCT INR: INR: 1.1

## 2015-08-28 ENCOUNTER — Ambulatory Visit (INDEPENDENT_AMBULATORY_CARE_PROVIDER_SITE_OTHER): Payer: Medicare Other | Admitting: *Deleted

## 2015-08-28 DIAGNOSIS — I4891 Unspecified atrial fibrillation: Secondary | ICD-10-CM | POA: Diagnosis not present

## 2015-08-28 DIAGNOSIS — Z5181 Encounter for therapeutic drug level monitoring: Secondary | ICD-10-CM | POA: Diagnosis not present

## 2015-08-28 DIAGNOSIS — Z7901 Long term (current) use of anticoagulants: Secondary | ICD-10-CM

## 2015-08-28 DIAGNOSIS — I4892 Unspecified atrial flutter: Secondary | ICD-10-CM

## 2015-08-28 LAB — POCT INR: INR: 1.3

## 2015-08-29 ENCOUNTER — Ambulatory Visit (INDEPENDENT_AMBULATORY_CARE_PROVIDER_SITE_OTHER): Payer: Medicare Other | Admitting: Internal Medicine

## 2015-08-29 VITALS — BP 126/76 | HR 67 | Temp 98.2°F | Ht 69.0 in | Wt 204.1 lb

## 2015-08-29 DIAGNOSIS — I255 Ischemic cardiomyopathy: Secondary | ICD-10-CM | POA: Diagnosis not present

## 2015-08-29 DIAGNOSIS — E785 Hyperlipidemia, unspecified: Secondary | ICD-10-CM

## 2015-08-29 DIAGNOSIS — I10 Essential (primary) hypertension: Secondary | ICD-10-CM

## 2015-08-29 LAB — BASIC METABOLIC PANEL
BUN: 28 mg/dL — AB (ref 6–23)
CALCIUM: 9.3 mg/dL (ref 8.4–10.5)
CO2: 25 meq/L (ref 19–32)
CREATININE: 1.61 mg/dL — AB (ref 0.40–1.20)
Chloride: 99 mEq/L (ref 96–112)
GFR: 33.28 mL/min — AB (ref >60.00)
GLUCOSE: 163 mg/dL — AB (ref 70–99)
Potassium: 4.5 mEq/L (ref 3.5–5.1)
SODIUM: 135 meq/L (ref 135–145)

## 2015-08-29 LAB — LIPID PANEL
Cholesterol: 274 mg/dL — ABNORMAL HIGH (ref 0–200)
HDL: 53.6 mg/dL (ref >39.00)
LDL Cholesterol: 195 mg/dL — ABNORMAL HIGH (ref 0–99)
NONHDL: 220.01
Total CHOL/HDL Ratio: 5
Triglycerides: 123 mg/dL (ref 0.0–149.0)
VLDL: 24.6 mg/dL (ref 0.0–40.0)

## 2015-08-29 NOTE — Progress Notes (Signed)
Subjective:    Patient ID: [name] [name], female    DOB: 06-22-1940, 75 y.o.   MRN: [id number]  DOS:  08/29/2015 Type of visit - description : Follow-up Interval history: Since the last office visit she is feeling better, lack of energy has resolved. She saw urology, no further testing was recommended. HTN: Good compliance of medication, ambulatory BPs within normal High cholesterol: On statins, due for a FLP   Review of Systems  Denies chest pain or difficulty breathing No dysuria or gross hematuria No nausea, vomiting, diarrhea Past Medical History  Diagnosis Date  . Blindness of left eye   . Eye muscle weakness     Right eye weakness after cataract surgery  . Common migraine     ?hx of  . DIABETES MELLITUS, TYPE II 03/27/2006    dr [name]  . HYPERLIPIDEMIA 03/27/2006  . 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 HYPERTENSION 03/27/2006  . Atrial fibrillation ([place])     SVT dx 2007, cath 2007 mild CAD, then had an cardioversion, ablation; still on coumadin , has occ palpitation, EKG 03-2010 NSR  . Headache(784.0) 05/07/2009  . LUNG NODULE 09/01/2006    Excision, Bx Benighn  . Osteopenia 2004    Dexa 2004 showed Osteopenia, DEXA 03/2007 normal  . Seizures ([place])   . Intertrochanteric fracture of right hip ([place]) 07/13/2012  . 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 GERD (gastroesophageal reflux disease) 10/05/2011  . Recurrent urinary tract infection     Seeing Urology  . 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[place]) Q000111Q  . Other chronic cystitis with hematuria     Past Surgical History  Procedure Laterality Date  . Shoulder surgery    . Femur im nail Right 07/15/2012    Procedure: INTRAMEDULLARY (IM) NAIL HIP;  Surgeon: Johnny Bridge, MD;  Location: [facility];  Service: Orthopedics;  Laterality: Right;  . Cardiac catheterization N/A 05/17/2015    Procedure: Left Heart Cath and Coronary Angiography;  Surgeon: Jettie Booze, MD;  Location: [facility];  Service: Cardiovascular;  Laterality: N/A;   . Cardiac catheterization N/A 05/17/2015    Procedure: Coronary Balloon Angioplasty;  Surgeon: Jettie Booze, MD;  Location: [facility];  Service: Cardiovascular;  Laterality: N/A;    Social History   Social History  . Marital Status: Married    Spouse Name: Jenny Reichmann  . Number of Children: 1  . Years of Education: College   Occupational History  . retired     Social History Main Topics  . Smoking status: Former Smoker    Types: Cigarettes    Quit date: 03/02/1978  . Smokeless tobacco: Never Used  . Alcohol Use: No  . Drug Use: No  . Sexual Activity: No   Other Topics Concern  . Not on file   Social History Narrative   Patient lives at home spouse.   Moved from [place] 2004   1 son, problems w/ drugs, at home             Medication List       This list is accurate as of: 08/29/15 11:59 PM.  Always use your most recent med list.               atorvastatin 40 MG tablet  Commonly known as:  LIPITOR  Take 2 tablets (80 mg total) by mouth at bedtime.     calcium-vitamin D 500-200 MG-UNIT tablet  Commonly known as:  OSCAL WITH D  Take 1 tablet by  mouth daily.     CENTRUM SILVER ADULT 50+ PO  Take 1 tablet by mouth every morning. Reported on 08/07/2015     clopidogrel 75 MG tablet  Commonly known as:  PLAVIX  Take 1 tablet (75 mg total) by mouth daily.     escitalopram 10 MG tablet  Commonly known as:  LEXAPRO  TAKE TWO TABLETS BY MOUTH ONCE DAILY     furosemide 40 MG tablet  Commonly known as:  LASIX  Take 1 tablet (40 mg total) by mouth 2 (two) times daily.     insulin NPH-regular Human (70-30) 100 UNIT/ML injection  Commonly known as:  NOVOLIN 70/30  Inject into the skin. 30 Units in the morning; 12 units in the evening.     lisinopril 5 MG tablet  Commonly known as:  PRINIVIL,ZESTRIL  Take 1 tablet (5 mg total) by mouth daily.     LORazepam 0.5 MG tablet  Commonly known as:  ATIVAN  Take 0.5 tablets (0.25 mg total) by mouth every 8 (eight)  hours as needed for anxiety.     metoprolol succinate 50 MG 24 hr tablet  Commonly known as:  TOPROL-XL  Take 1 tablet by mouth in the evening. Take with or immediately following a meal.     nitroGLYCERIN 0.4 MG SL tablet  Commonly known as:  NITROSTAT  Place 1 tablet (0.4 mg total) under the tongue every 5 (five) minutes as needed for chest pain.     pantoprazole 40 MG tablet  Commonly known as:  PROTONIX  Take 1 tablet (40 mg total) by mouth daily.     phenytoin 100 MG ER capsule  Commonly known as:  DILANTIN  Take 3 capsules (300 mg total) by mouth daily.     promethazine 12.5 MG tablet  Commonly known as:  PHENERGAN  Take 1 tablet (12.5 mg total) by mouth every 6 (six) hours as needed for nausea or vomiting.     saccharomyces boulardii 250 MG capsule  Commonly known as:  FLORASTOR  Take 1 capsule (250 mg total) by mouth 2 (two) times daily.     warfarin 2.5 MG tablet  Commonly known as:  COUMADIN  Take 1.25 mg by mouth daily.           Objective:   Physical Exam BP 126/76 mmHg  Pulse 67  Temp(Src) 98.2 F (36.8 C) (Oral)  Ht 5\' 9"  (1.753 m)  Wt 204 lb 2 oz (92.59 kg)  BMI 30.13 kg/m2  SpO2 95% General:   Well developed, well nourished . NAD.  HEENT:  Normocephalic . Face symmetric, atraumatic Lungs:  CTA B Normal respiratory effort, no intercostal retractions, no accessory muscle use. Heart: RRR,  no murmur.  No pretibial edema bilaterally  Skin: Not pale. Not jaundice Neurologic:  alert & oriented X3.  Speech normal, gait appropriate for age and unassisted Psych--  Cognition and judgment appear intact.  Cooperative with normal attention span and concentration.  Behavior appropriate. No anxious or depressed appearing.      Assessment & Plan:   Assessment > DM  + retinopathy, Dr [name] HTN Hyperlipidemia GERD Elevated creatinine(started after admission 05-2015, had IV contrast) Anemia, normal iron 05-2015, never had a  colonoscopy CV ---NSTEMI 05-2015, cath, angiplasty ---Echo, EF 40 % (05-2015) ---Atrial fibrillation, SVT -- dx 2007, cath 2007 mild CAD. 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 Check a BMP. Elevated creatinine: Since admission 05-2015, had a contrast CT then, no renal obstruction noted.  We are checking a BMP High cholesterol: On statins, check a FLP, Anemia: DX discussed with the patient, offered a colonoscopy, she is quite reluctant and I understand, Coumadin would have to be stopped, she has a number of other medical issues. We agreed that she will think about it.[name] RTC 3-4 months , CPX

## 2015-08-29 NOTE — Patient Instructions (Signed)
GO TO THE LAB : Get the blood work     GO TO THE FRONT DESK Schedule your next appointment for a  Physical in 3-4 months

## 2015-08-29 NOTE — Progress Notes (Signed)
Pre visit review using our clinic review tool, if applicable. No additional management support is needed unless otherwise documented below in the visit note. 

## 2015-08-30 ENCOUNTER — Other Ambulatory Visit: Payer: Self-pay | Admitting: Internal Medicine

## 2015-08-30 NOTE — Addendum Note (Signed)
Addended byDamita Dunnings D on: 08/30/2015 05:09 PM   Modules accepted: Orders

## 2015-08-30 NOTE — Assessment & Plan Note (Signed)
Nausea: Resolved Fatigue: Resolved HTN: Continue lisinopril, Lasix, Toprol. Check a BMP. Elevated creatinine: Since admission 05-2015, had a contrast CT then, no renal obstruction noted.  We are checking a BMP High cholesterol: On statins, check a FLP, Anemia: DX discussed with the patient, offered a colonoscopy, she is quite reluctant and I understand, Coumadin would have to be stopped, she has a number of other medical issues. We agreed that she will think about it.[name] RTC 3-4 months , CPX

## 2015-08-31 HISTORY — PX: ANKLE FRACTURE SURGERY: SHX122

## 2015-09-02 NOTE — Telephone Encounter (Signed)
Rx faxed to pharmacy/[facility] 07/03

## 2015-09-06 ENCOUNTER — Ambulatory Visit (INDEPENDENT_AMBULATORY_CARE_PROVIDER_SITE_OTHER): Payer: Medicare Other | Admitting: *Deleted

## 2015-09-06 DIAGNOSIS — Z5181 Encounter for therapeutic drug level monitoring: Secondary | ICD-10-CM | POA: Diagnosis not present

## 2015-09-06 DIAGNOSIS — I4892 Unspecified atrial flutter: Secondary | ICD-10-CM | POA: Diagnosis not present

## 2015-09-06 DIAGNOSIS — Z7901 Long term (current) use of anticoagulants: Secondary | ICD-10-CM

## 2015-09-06 DIAGNOSIS — I4891 Unspecified atrial fibrillation: Secondary | ICD-10-CM | POA: Diagnosis not present

## 2015-09-06 LAB — POCT INR: INR: 3.6

## 2015-09-14 ENCOUNTER — Emergency Department (HOSPITAL_COMMUNITY): Payer: Medicare Other

## 2015-09-14 ENCOUNTER — Encounter (HOSPITAL_COMMUNITY): Payer: Self-pay | Admitting: Family Medicine

## 2015-09-14 ENCOUNTER — Inpatient Hospital Stay (HOSPITAL_COMMUNITY)
Admission: EM | Admit: 2015-09-14 | Discharge: 2015-09-18 | DRG: 563 | Disposition: A | Discharge status: Skilled Nursing Facility | Payer: Medicare Other | Attending: Internal Medicine | Admitting: Internal Medicine

## 2015-09-14 DIAGNOSIS — I5022 Chronic systolic (congestive) heart failure: Secondary | ICD-10-CM | POA: Diagnosis present

## 2015-09-14 DIAGNOSIS — G40909 Epilepsy, unspecified, not intractable, without status epilepticus: Secondary | ICD-10-CM

## 2015-09-14 DIAGNOSIS — I10 Essential (primary) hypertension: Secondary | ICD-10-CM | POA: Diagnosis not present

## 2015-09-14 DIAGNOSIS — I480 Paroxysmal atrial fibrillation: Secondary | ICD-10-CM | POA: Diagnosis present

## 2015-09-14 DIAGNOSIS — E11319 Type 2 diabetes mellitus with unspecified diabetic retinopathy without macular edema: Secondary | ICD-10-CM | POA: Diagnosis present

## 2015-09-14 DIAGNOSIS — Z888 Allergy status to other drugs, medicaments and biological substances status: Secondary | ICD-10-CM | POA: Diagnosis not present

## 2015-09-14 DIAGNOSIS — S82841K Displaced bimalleolar fracture of right lower leg, subsequent encounter for closed fracture with nonunion: Secondary | ICD-10-CM | POA: Diagnosis not present

## 2015-09-14 DIAGNOSIS — N183 Chronic kidney disease, stage 3 (moderate): Secondary | ICD-10-CM | POA: Diagnosis present

## 2015-09-14 DIAGNOSIS — N179 Acute kidney failure, unspecified: Secondary | ICD-10-CM | POA: Diagnosis present

## 2015-09-14 DIAGNOSIS — E118 Type 2 diabetes mellitus with unspecified complications: Secondary | ICD-10-CM

## 2015-09-14 DIAGNOSIS — E1122 Type 2 diabetes mellitus with diabetic chronic kidney disease: Secondary | ICD-10-CM | POA: Diagnosis present

## 2015-09-14 DIAGNOSIS — Z79899 Other long term (current) drug therapy: Secondary | ICD-10-CM | POA: Diagnosis not present

## 2015-09-14 DIAGNOSIS — I2510 Atherosclerotic heart disease of native coronary artery without angina pectoris: Secondary | ICD-10-CM | POA: Diagnosis present

## 2015-09-14 DIAGNOSIS — H5442 Blindness, left eye, normal vision right eye: Secondary | ICD-10-CM | POA: Diagnosis present

## 2015-09-14 DIAGNOSIS — W1830XA Fall on same level, unspecified, initial encounter: Secondary | ICD-10-CM | POA: Diagnosis present

## 2015-09-14 DIAGNOSIS — S82841A Displaced bimalleolar fracture of right lower leg, initial encounter for closed fracture: Secondary | ICD-10-CM | POA: Diagnosis present

## 2015-09-14 DIAGNOSIS — K219 Gastro-esophageal reflux disease without esophagitis: Secondary | ICD-10-CM | POA: Diagnosis present

## 2015-09-14 DIAGNOSIS — I252 Old myocardial infarction: Secondary | ICD-10-CM

## 2015-09-14 DIAGNOSIS — E1159 Type 2 diabetes mellitus with other circulatory complications: Secondary | ICD-10-CM | POA: Diagnosis not present

## 2015-09-14 DIAGNOSIS — Z794 Long term (current) use of insulin: Secondary | ICD-10-CM | POA: Diagnosis not present

## 2015-09-14 DIAGNOSIS — Z7902 Long term (current) use of antithrombotics/antiplatelets: Secondary | ICD-10-CM | POA: Diagnosis not present

## 2015-09-14 DIAGNOSIS — Z7901 Long term (current) use of anticoagulants: Secondary | ICD-10-CM | POA: Diagnosis not present

## 2015-09-14 DIAGNOSIS — I4891 Unspecified atrial fibrillation: Secondary | ICD-10-CM | POA: Diagnosis present

## 2015-09-14 DIAGNOSIS — R52 Pain, unspecified: Secondary | ICD-10-CM

## 2015-09-14 DIAGNOSIS — I130 Hypertensive heart and chronic kidney disease with heart failure and stage 1 through stage 4 chronic kidney disease, or unspecified chronic kidney disease: Secondary | ICD-10-CM | POA: Diagnosis present

## 2015-09-14 DIAGNOSIS — S82402A Unspecified fracture of shaft of left fibula, initial encounter for closed fracture: Secondary | ICD-10-CM | POA: Diagnosis present

## 2015-09-14 DIAGNOSIS — E876 Hypokalemia: Secondary | ICD-10-CM | POA: Diagnosis present

## 2015-09-14 DIAGNOSIS — Z87891 Personal history of nicotine dependence: Secondary | ICD-10-CM

## 2015-09-14 DIAGNOSIS — E785 Hyperlipidemia, unspecified: Secondary | ICD-10-CM | POA: Diagnosis present

## 2015-09-14 DIAGNOSIS — I110 Hypertensive heart disease with heart failure: Secondary | ICD-10-CM | POA: Diagnosis not present

## 2015-09-14 DIAGNOSIS — S82892A Other fracture of left lower leg, initial encounter for closed fracture: Secondary | ICD-10-CM | POA: Diagnosis not present

## 2015-09-14 LAB — CBC WITH DIFFERENTIAL/PLATELET
BASOS ABS: 0 10*3/uL (ref 0.0–0.1)
BASOS PCT: 0 %
EOS ABS: 0 10*3/uL (ref 0.0–0.7)
EOS PCT: 0 %
HEMATOCRIT: 31.5 % — AB (ref 36.0–46.0)
Hemoglobin: 10.6 g/dL — ABNORMAL LOW (ref 12.0–15.0)
Lymphocytes Relative: 13 %
Lymphs Abs: 1.6 10*3/uL (ref 0.7–4.0)
MCH: 29.2 pg (ref 26.0–34.0)
MCHC: 33.7 g/dL (ref 30.0–36.0)
MCV: 86.8 fL (ref 78.0–100.0)
MONO ABS: 1.8 10*3/uL — AB (ref 0.1–1.0)
Monocytes Relative: 14 %
NEUTROS ABS: 9.3 10*3/uL — AB (ref 1.7–7.7)
Neutrophils Relative %: 73 %
PLATELETS: 284 10*3/uL (ref 150–400)
RBC: 3.63 MIL/uL — ABNORMAL LOW (ref 3.87–5.11)
RDW: 14.3 % (ref 11.5–15.5)
WBC: 12.8 10*3/uL — ABNORMAL HIGH (ref 4.0–10.5)

## 2015-09-14 LAB — BASIC METABOLIC PANEL
ANION GAP: 11 (ref 5–15)
BUN: 31 mg/dL — ABNORMAL HIGH (ref 6–20)
CALCIUM: 9.1 mg/dL (ref 8.9–10.3)
CO2: 27 mmol/L (ref 22–32)
CREATININE: 2.06 mg/dL — AB (ref 0.44–1.00)
Chloride: 93 mmol/L — ABNORMAL LOW (ref 101–111)
GFR, EST AFRICAN AMERICAN: 26 mL/min — AB (ref >60)
GFR, EST NON AFRICAN AMERICAN: 23 mL/min — AB (ref >60)
Glucose, Bld: 119 mg/dL — ABNORMAL HIGH (ref 65–99)
Potassium: 3.2 mmol/L — ABNORMAL LOW (ref 3.5–5.1)
SODIUM: 131 mmol/L — AB (ref 135–145)

## 2015-09-14 LAB — GLUCOSE, CAPILLARY: Glucose-Capillary: 170 mg/dL — ABNORMAL HIGH (ref 65–99)

## 2015-09-14 LAB — PHENYTOIN LEVEL, TOTAL: PHENYTOIN LVL: 2.8 ug/mL — AB (ref 10.0–20.0)

## 2015-09-14 LAB — PROTIME-INR
INR: 5.18 — AB (ref 0.00–1.49)
Prothrombin Time: 46.1 seconds — ABNORMAL HIGH (ref 11.6–15.2)

## 2015-09-14 MED ORDER — ONDANSETRON HCL 4 MG PO TABS: 4.0000 mg | ORAL_TABLET | Freq: Four times a day (QID) | ORAL | Status: DC | PRN

## 2015-09-14 MED ORDER — NITROGLYCERIN 0.4 MG SL SUBL: 0.4000 mg | SUBLINGUAL_TABLET | SUBLINGUAL | Status: DC | PRN

## 2015-09-14 MED ORDER — INSULIN ASPART PROT & ASPART (70-30 MIX) 100 UNIT/ML ~~LOC~~ SUSP
12.0000 [IU] | Freq: Two times a day (BID) | SUBCUTANEOUS | Status: DC
  Administered 2015-09-15 – 2015-09-16 (×3): 12 [IU] via SUBCUTANEOUS
  Filled 2015-09-14 (×2): qty 10

## 2015-09-14 MED ORDER — ACETAMINOPHEN 325 MG PO TABS
650.0000 mg | ORAL_TABLET | Freq: Four times a day (QID) | ORAL | Status: DC | PRN
Start: 2015-09-14 — End: 2015-09-18

## 2015-09-14 MED ORDER — INSULIN ASPART PROT & ASPART (70-30 MIX) 100 UNIT/ML ~~LOC~~ SUSP
28.0000 [IU] | Freq: Every day | SUBCUTANEOUS | Status: DC
  Filled 2015-09-14: qty 10

## 2015-09-14 MED ORDER — ESCITALOPRAM OXALATE 10 MG PO TABS
20.0000 mg | ORAL_TABLET | Freq: Every day | ORAL | Status: DC
  Administered 2015-09-15 – 2015-09-18 (×4): 20 mg via ORAL
  Filled 2015-09-14 (×4): qty 2

## 2015-09-14 MED ORDER — MORPHINE SULFATE (PF) 2 MG/ML IV SOLN
2.0000 mg | INTRAVENOUS | Status: DC | PRN
  Administered 2015-09-14: 2 mg via INTRAVENOUS
  Filled 2015-09-14 (×3): qty 1

## 2015-09-14 MED ORDER — FUROSEMIDE 40 MG PO TABS
40.0000 mg | ORAL_TABLET | Freq: Two times a day (BID) | ORAL | Status: DC
  Administered 2015-09-15: 40 mg via ORAL
  Filled 2015-09-14: qty 1

## 2015-09-14 MED ORDER — METOPROLOL SUCCINATE ER 50 MG PO TB24
50.0000 mg | ORAL_TABLET | Freq: Every day | ORAL | Status: DC
  Administered 2015-09-15 – 2015-09-18 (×4): 50 mg via ORAL
  Filled 2015-09-14 (×4): qty 1

## 2015-09-14 MED ORDER — PHENYTOIN SODIUM EXTENDED 100 MG PO CAPS
300.0000 mg | ORAL_CAPSULE | Freq: Every day | ORAL | Status: DC
  Administered 2015-09-15 – 2015-09-18 (×4): 300 mg via ORAL
  Filled 2015-09-14 (×4): qty 3

## 2015-09-14 MED ORDER — ACETAMINOPHEN 650 MG RE SUPP: 650.0000 mg | Freq: Four times a day (QID) | RECTAL | Status: DC | PRN

## 2015-09-14 MED ORDER — INSULIN ASPART 100 UNIT/ML ~~LOC~~ SOLN
0.0000 [IU] | Freq: Three times a day (TID) | SUBCUTANEOUS | Status: DC
  Administered 2015-09-15: 3 [IU] via SUBCUTANEOUS
  Administered 2015-09-16: 8 [IU] via SUBCUTANEOUS
  Administered 2015-09-16: 3 [IU] via SUBCUTANEOUS
  Administered 2015-09-16: 2 [IU] via SUBCUTANEOUS
  Administered 2015-09-17: 8 [IU] via SUBCUTANEOUS
  Administered 2015-09-17: 2 [IU] via SUBCUTANEOUS
  Administered 2015-09-18: 11 [IU] via SUBCUTANEOUS
  Administered 2015-09-18: 2 [IU] via SUBCUTANEOUS

## 2015-09-14 MED ORDER — ONDANSETRON HCL 4 MG/2ML IJ SOLN: 4.0000 mg | Freq: Four times a day (QID) | INTRAMUSCULAR | Status: DC | PRN

## 2015-09-14 MED ORDER — PANTOPRAZOLE SODIUM 40 MG PO TBEC
40.0000 mg | DELAYED_RELEASE_TABLET | Freq: Every day | ORAL | Status: DC
  Administered 2015-09-15 – 2015-09-18 (×4): 40 mg via ORAL
  Filled 2015-09-14 (×4): qty 1

## 2015-09-14 MED ORDER — LORAZEPAM 0.5 MG PO TABS
0.5000 mg | ORAL_TABLET | Freq: Two times a day (BID) | ORAL | Status: DC | PRN
  Administered 2015-09-14: 0.5 mg via ORAL
  Filled 2015-09-14: qty 1

## 2015-09-14 MED ORDER — POTASSIUM CHLORIDE IN NACL 20-0.9 MEQ/L-% IV SOLN
INTRAVENOUS | Status: DC
  Administered 2015-09-14: 22:00:00 via INTRAVENOUS
  Filled 2015-09-14: qty 1000

## 2015-09-14 MED ORDER — INSULIN ASPART 100 UNIT/ML ~~LOC~~ SOLN
0.0000 [IU] | Freq: Every day | SUBCUTANEOUS | Status: DC
  Administered 2015-09-15 (×2): 2 [IU] via SUBCUTANEOUS

## 2015-09-14 MED ORDER — SENNOSIDES-DOCUSATE SODIUM 8.6-50 MG PO TABS: 1.0000 | ORAL_TABLET | Freq: Every evening | ORAL | Status: DC | PRN

## 2015-09-14 MED ORDER — SACCHAROMYCES BOULARDII 250 MG PO CAPS
250.0000 mg | ORAL_CAPSULE | Freq: Two times a day (BID) | ORAL | Status: DC
  Administered 2015-09-14 – 2015-09-18 (×8): 250 mg via ORAL
  Filled 2015-09-14 (×8): qty 1

## 2015-09-14 MED ORDER — ATORVASTATIN CALCIUM 80 MG PO TABS
80.0000 mg | ORAL_TABLET | Freq: Every day | ORAL | Status: DC
  Administered 2015-09-14 – 2015-09-17 (×4): 80 mg via ORAL
  Filled 2015-09-14 (×4): qty 1

## 2015-09-14 MED ORDER — LISINOPRIL 5 MG PO TABS
5.0000 mg | ORAL_TABLET | Freq: Every day | ORAL | Status: DC
  Administered 2015-09-15 – 2015-09-18 (×4): 5 mg via ORAL
  Filled 2015-09-14 (×4): qty 1

## 2015-09-14 NOTE — ED Notes (Addendum)
Pt presents from home via [facility] with c/o fall (reports diaphoresis and shaking just prior to fall, no syncope - is diabetic but EMS CBG was 139), and new onset foot pain s/p fall.  No obvious deformities or injury. Pt is A&Ox4.  Reports history of shaking/diaphoresis/fall about 4 years ago.

## 2015-09-14 NOTE — ED Provider Notes (Signed)
CSN: [id number]     Arrival date & time 09/14/15  1409 History   First MD Initiated Contact with Patient 09/14/15 1454     Chief Complaint  Patient presents with  . Fall  . Foot Pain     (Consider location/radiation/quality/duration/timing/severity/associated sxs/prior Treatment) Patient is a 75 y.o. female presenting with fall and lower extremity pain. The history is provided by the patient (Patient fell today when she was shaking her right ankle).  Fall This is a new problem. The current episode started 1 to 2 hours ago. The problem occurs constantly. The problem has not changed since onset.Pertinent negatives include no chest pain, no abdominal pain and no headaches. Nothing aggravates the symptoms. Nothing relieves the symptoms.  Foot Pain Pertinent negatives include no chest pain, no abdominal pain and no headaches.    Past Medical History  Diagnosis Date  . Blindness of left eye   . Eye muscle weakness     Right eye weakness after cataract surgery  . Common migraine     ?hx of  . DIABETES MELLITUS, TYPE II 03/27/2006    dr [name]  . HYPERLIPIDEMIA 03/27/2006  . 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 HYPERTENSION 03/27/2006  . Atrial fibrillation ([place])     SVT dx 2007, cath 2007 mild CAD, then had an cardioversion, ablation; still on coumadin , has occ palpitation, EKG 03-2010 NSR  . Headache(784.0) 05/07/2009  . LUNG NODULE 09/01/2006    Excision, Bx Benighn  . Osteopenia 2004    Dexa 2004 showed Osteopenia, DEXA 03/2007 normal  . Seizures ([place])   . Intertrochanteric fracture of right hip ([place]) 07/13/2012  . 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 GERD (gastroesophageal reflux disease) 10/05/2011  . Recurrent urinary tract infection     Seeing Urology  . 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[place]) [id number]  . Other chronic cystitis with hematuria    Past Surgical History  Procedure Laterality Date  . Shoulder surgery    . Femur im nail Right 07/15/2012    Procedure: INTRAMEDULLARY (IM) NAIL HIP;   Surgeon: Johnny Bridge, MD;  Location: [place];  Service: Orthopedics;  Laterality: Right;  . Cardiac catheterization N/A 05/17/2015    Procedure: Left Heart Cath and Coronary Angiography;  Surgeon: Jettie Booze, MD;  Location: [place] [facility];  Service: Cardiovascular;  Laterality: N/A;  . Cardiac catheterization N/A 05/17/2015    Procedure: Coronary Balloon Angioplasty;  Surgeon: Jettie Booze, MD;  Location: [facility] [facility];  Service: Cardiovascular;  Laterality: N/A;   Family History  Problem Relation Age of Onset  . Diabetes Father   . Heart attack Father 50  . Diabetes Sister   . Cancer Neg Hx     no hx of colon or breast cancer   Social History  Substance Use Topics  . Smoking status: Former Smoker    Types: Cigarettes    Quit date: 03/02/1978  . Smokeless tobacco: Never Used  . Alcohol Use: No   OB History    No data available     Review of Systems  Constitutional: Negative for appetite change and fatigue.  HENT: Negative for congestion, ear discharge and sinus pressure.   Eyes: Negative for discharge.  Respiratory: Negative for cough.   Cardiovascular: Negative for chest pain.  Gastrointestinal: Negative for abdominal pain and diarrhea.  Genitourinary: Negative for frequency and hematuria.  Musculoskeletal: Negative for back pain.       Right ankle pain  Skin: Negative for rash.  Neurological: Negative  for seizures and headaches.  Psychiatric/Behavioral: Negative for hallucinations.      Allergies  Procaine hcl  Home Medications   Prior to Admission medications   Medication Sig Start Date End Date Taking? Authorizing Provider  atorvastatin (LIPITOR) 40 MG tablet Take 2 tablets (80 mg total) by mouth at bedtime. 05/21/15  Yes Ripudeep Krystal Eaton, MD  calcium-vitamin D (OSCAL WITH D) 500-200 MG-UNIT per tablet Take 1 tablet by mouth daily.   Yes Historical Provider, MD  clopidogrel (PLAVIX) 75 MG tablet Take 1 tablet (75 mg total) by mouth daily.  05/21/15  Yes Ripudeep K Rai, MD  escitalopram (LEXAPRO) 10 MG tablet TAKE TWO TABLETS BY MOUTH ONCE DAILY 07/09/15  Yes Garvin Fila, MD  furosemide (LASIX) 40 MG tablet Take 1 tablet (40 mg total) by mouth 2 (two) times daily. 05/21/15  Yes Ripudeep Krystal Eaton, MD  insulin NPH-regular Human (NOVOLIN 70/30) (70-30) 100 UNIT/ML injection Inject into the skin. 30 Units in the morning; 12 units in the evening.   Yes Historical Provider, MD  lisinopril (PRINIVIL,ZESTRIL) 5 MG tablet Take 1 tablet (5 mg total) by mouth daily. 06/07/15  Yes Skeet Latch, MD  LORazepam (ATIVAN) 0.5 MG tablet TAKE ONE TABLET BY MOUTH TWICE DAILY AS NEEDED FOR ANXIETY 09/02/15  Yes Mosie Lukes, MD  metoprolol succinate (TOPROL-XL) 50 MG 24 hr tablet Take 1 tablet by mouth in the evening. Take with or immediately following a meal. 08/01/15  Yes Skeet Latch, MD  Multiple Vitamins-Minerals (CENTRUM SILVER ADULT 50+ PO) Take 1 tablet by mouth every morning. Reported on 08/07/2015   Yes Historical Provider, MD  nitroGLYCERIN (NITROSTAT) 0.4 MG SL tablet Place 1 tablet (0.4 mg total) under the tongue every 5 (five) minutes as needed for chest pain. 05/21/15  Yes Ripudeep Krystal Eaton, MD  pantoprazole (PROTONIX) 40 MG tablet Take 1 tablet (40 mg total) by mouth daily. 08/07/15  Yes Colon Branch, MD  phenytoin (DILANTIN) 100 MG ER capsule Take 3 capsules (300 mg total) by mouth daily. 06/06/15  Yes Colon Branch, MD  promethazine (PHENERGAN) 12.5 MG tablet TAKE ONE TABLET BY MOUTH EVERY 6 HOURS AS NEEDED FOR NAUSEA AND VOMITING 09/02/15  Yes Mosie Lukes, MD  saccharomyces boulardii (FLORASTOR) 250 MG capsule Take 1 capsule (250 mg total) by mouth 2 (two) times daily. 05/11/15  Yes Nishant Dhungel, MD  warfarin (COUMADIN) 2.5 MG tablet Take 1.25 mg by mouth daily.   Yes Historical Provider, MD   BP 152/72 mmHg  Pulse 69  Temp(Src) 98 F (36.7 C) (Oral)  Resp 19  SpO2 97% Physical Exam  Constitutional: She is oriented to person, place, and time.  She appears well-developed.  HENT:  Head: Normocephalic.  Eyes: Conjunctivae and EOM are normal. No scleral icterus.  Neck: Neck supple. No thyromegaly present.  Cardiovascular: Normal rate and regular rhythm.  Exam reveals no gallop and no friction rub.   No murmur heard. Pulmonary/Chest: No stridor. She has no wheezes. She has no rales. She exhibits no tenderness.  Abdominal: She exhibits no distension. There is no tenderness. There is no rebound.  Musculoskeletal: Normal range of motion. She exhibits no edema.  Tender right ankle mild swelling  Lymphadenopathy:    She has no cervical adenopathy.  Neurological: She is oriented to person, place, and time. She exhibits normal muscle tone. Coordination normal.  General tremor occasionally  Skin: No rash noted. No erythema.  Psychiatric: She has a normal mood and affect. Her  behavior is normal.    ED Course  Procedures (including critical care time) Labs Review Labs Reviewed  CBC WITH DIFFERENTIAL/PLATELET - Abnormal; Notable for the following:    WBC 12.8 (*)    RBC 3.63 (*)    Hemoglobin 10.6 (*)    HCT 31.5 (*)    Neutro Abs 9.3 (*)    Monocytes Absolute 1.8 (*)    All other components within normal limits  BASIC METABOLIC PANEL - Abnormal; Notable for the following:    Sodium 131 (*)    Potassium 3.2 (*)    Chloride 93 (*)    Glucose, Bld 119 (*)    BUN 31 (*)    Creatinine, Ser 2.06 (*)    GFR calc non Af Amer 23 (*)    GFR calc Af Amer 26 (*)    All other components within normal limits  PROTIME-INR - Abnormal; Notable for the following:    Prothrombin Time 46.1 (*)    All other components within normal limits    Imaging Review Dg Tibia/fibula Right  09/14/2015  CLINICAL DATA:  Right lower leg pain after fall today. EXAM: RIGHT TIBIA AND FIBULA - 2 VIEW COMPARISON:  Radiographs of Jul 13, 2012. FINDINGS: Possible minimally displaced fracture involving the posterior aspect of the distal tibia. Ankle radiographs are  recommended for further evaluation. Also noted is probable minimally displaced fracture involving the distal right fibula. Vascular calcifications are noted in the soft tissues. IMPRESSION: Possible minimally displaced posterior malleolar fracture of distal tibia. Probable minimally displaced distal right fibular fracture. Ankle radiographs are recommended for further evaluation. Electronically Signed   By: Marijo Conception, M.D.   On: 09/14/2015 15:58   Dg Ankle Complete Right  09/14/2015  CLINICAL DATA:  Lateral right ankle pain and swelling EXAM: RIGHT ANKLE - COMPLETE 3+ VIEW COMPARISON:  None. FINDINGS: There is mild diffuse osteopenia. Lateral soft tissue swelling is identified. A nondisplaced fracture involving the distal fibula is suspected. No additional fractures identified. IMPRESSION: Suspect nondisplaced distal fibular fracture. The possible posterior malleolar fracture is not well visualized on this study. Given the treatment implications a CT of the right ankle would seem reasonable to completely assess the extent of fractures. Electronically Signed   By: Kerby Moors M.D.   On: 09/14/2015 17:05   Ct Ankle Right Wo Contrast  09/14/2015  CLINICAL DATA:  SMA 23-year-old female with fall and right ankle pain and fracture. EXAM: CT OF THE RIGHT ANKLE WITHOUT CONTRAST TECHNIQUE: Multidetector CT imaging of the right ankle was performed according to the standard protocol. Multiplanar CT image reconstructions were also generated. COMPARISON:  Right lower extremity radiograph dated 09/14/2015 FINDINGS: There is a nondisplaced oblique fracture of the distal fibular with extension into the lateral malleolus. There is a nondisplaced fracture of the posterior malleolus with involvement of the posterior articular surface of the tibial plafond. No other fracture identified. The bones are osteopenic. There is no dislocation. The ankle mortise intact. There is soft swelling around the ankle primarily over the  lateral malleolus. No fluid collection no large hematoma. IMPRESSION: Nondisplaced oblique fracture of the distal fibula and lateral malleolus as well as nondisplaced fracture of the posterior malleolus. No dislocation. Electronically Signed   By: Anner Crete M.D.   On: 09/14/2015 18:27   I have personally reviewed and evaluated these images and lab results as part of my medical decision-making.   EKG Interpretation   Date/Time:  Saturday September 14 2015 14:27:17 EDT Ventricular  Rate:  113 PR Interval:    QRS Duration: 133 QT Interval:  339 QTC Calculation: 465 R Axis:   -39 Text Interpretation:  Atrial fibrillation Nonspecific IVCD with LAD  Consider anterior infarct Borderline T abnormalities, inferior leads  Confirmed by Pankaj Haack  MD, Krissia Schreier [id number]) on 09/14/2015 7:05:44 PM      MDM   Final diagnoses:  Ankle fracture, bimalleolar, closed, right, initial encounter    Patient with bimalleolar fracture right ankle. Dr. Narda Amber console and will see the patient. Patient will be admitted to medicine    Milton Ferguson, MD 09/14/15 2021

## 2015-09-14 NOTE — Progress Notes (Signed)
Orthopedic Tech Progress Note Patient Details:  [name] [name] 11-26-1940 [id number]  Ortho Devices Type of Ortho Device: Ace wrap, Post (short leg) splint Ortho Device/Splint Location: RLE Ortho Device/Splint Interventions: Ordered, Application   Braulio Bosch 09/14/2015, 8:02 PM

## 2015-09-14 NOTE — H&P (Addendum)
PCP:   [name], MD   Chief Complaint:  Fall   HPI: [name] who has occasional episodes of full body shaking. She states when this happened she just sentences herself on the walls. She has been several doctors none of which can give her reason. This occurred today while she was in the garage. She fell, there is a question was if there was a loss of consciousness. She was unable to get up, her husband was close and he called 911. Imaging revealed a right fibula fracture. The hospitalist have been asked to admit.  The patient reports she had several episodes of nausea and vomiting yesterday. This is since resolved.  The patient has history of coronary artery disease with a recent MI, chronic systolic heart failure with EF of 35-40% at age of fibrillation on chronic Coumadin therapy. She falls frequently with cardiology Dr. Oval Linsey. The patient is a poor historian, she has a history of viral encephalitis and appears to have significant memory issues since. Family members are not present at bedside at the moment.  Review of Systems:  The patient denies anorexia, fever, weight loss,, vision loss, decreased hearing, hoarseness, chest pain, syncope, dyspnea on exertion, peripheral edema, balance deficits, hemoptysis, abdominal pain, melena, hematochezia, severe indigestion/heartburn, hematuria, incontinence, genital sores, muscle weakness, suspicious skin lesions, transient blindness, difficulty walking, falling, depression, unusual weight change, abnormal bleeding, enlarged lymph nodes, angioedema, and breast masses.  Past Medical History: Past Medical History  Diagnosis Date  . Blindness of left eye   . Eye muscle weakness     Right eye weakness after cataract surgery  . Common migraine     ?hx of  . DIABETES MELLITUS, TYPE II 03/27/2006    dr [name]  . HYPERLIPIDEMIA 03/27/2006  . 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 HYPERTENSION 03/27/2006  . Atrial fibrillation  ([place])     SVT dx 2007, cath 2007 mild CAD, then had an cardioversion, ablation; still on coumadin , has occ palpitation, EKG 03-2010 NSR  . Headache(784.0) 05/07/2009  . LUNG NODULE 09/01/2006    Excision, Bx Benighn  . Osteopenia 2004    Dexa 2004 showed Osteopenia, DEXA 03/2007 normal  . Seizures ([place])   . Intertrochanteric fracture of right hip ([place]) 07/13/2012  . 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 GERD (gastroesophageal reflux disease) 10/05/2011  . Recurrent urinary tract infection     Seeing Urology  . 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[place]) Q000111Q  . Other chronic cystitis with hematuria    Past Surgical History  Procedure Laterality Date  . Shoulder surgery    . Femur im nail Right 07/15/2012    Procedure: INTRAMEDULLARY (IM) NAIL HIP;  Surgeon: Johnny Bridge, MD;  Location: [place];  Service: Orthopedics;  Laterality: Right;  . Cardiac catheterization N/A 05/17/2015    Procedure: Left Heart Cath and Coronary Angiography;  Surgeon: Jettie Booze, MD;  Location: [facility];  Service: Cardiovascular;  Laterality: N/A;  . Cardiac catheterization N/A 05/17/2015    Procedure: Coronary Balloon Angioplasty;  Surgeon: Jettie Booze, MD;  Location: [facility];  Service: Cardiovascular;  Laterality: N/A;    Medications: Prior to Admission medications   Medication Sig Start Date End Date Taking? Authorizing Provider  atorvastatin (LIPITOR) 40 MG tablet Take 2 tablets (80 mg total) by mouth at bedtime. 05/21/15  Yes Ripudeep Krystal Eaton, MD  calcium-vitamin D (OSCAL WITH D) 500-200 MG-UNIT per tablet Take 1 tablet by mouth daily.   Yes  Historical Provider, MD  clopidogrel (PLAVIX) 75 MG tablet Take 1 tablet (75 mg total) by mouth daily. 05/21/15  Yes Ripudeep K Rai, MD  escitalopram (LEXAPRO) 10 MG tablet TAKE TWO TABLETS BY MOUTH ONCE DAILY 07/09/15  Yes Garvin Fila, MD  furosemide (LASIX) 40 MG tablet Take 1 tablet (40 mg total) by mouth 2 (two) times daily. 05/21/15  Yes Ripudeep Krystal Eaton, MD   insulin NPH-regular Human (NOVOLIN 70/30) (70-30) 100 UNIT/ML injection Inject into the skin. 30 Units in the morning; 12 units in the evening.   Yes Historical Provider, MD  lisinopril (PRINIVIL,ZESTRIL) 5 MG tablet Take 1 tablet (5 mg total) by mouth daily. 06/07/15  Yes Skeet Latch, MD  LORazepam (ATIVAN) 0.5 MG tablet TAKE ONE TABLET BY MOUTH TWICE DAILY AS NEEDED FOR ANXIETY 09/02/15  Yes Mosie Lukes, MD  metoprolol succinate (TOPROL-XL) 50 MG 24 hr tablet Take 1 tablet by mouth in the evening. Take with or immediately following a meal. 08/01/15  Yes Skeet Latch, MD  Multiple Vitamins-Minerals (CENTRUM SILVER ADULT 50+ PO) Take 1 tablet by mouth every morning. Reported on 08/07/2015   Yes Historical Provider, MD  nitroGLYCERIN (NITROSTAT) 0.4 MG SL tablet Place 1 tablet (0.4 mg total) under the tongue every 5 (five) minutes as needed for chest pain. 05/21/15  Yes Ripudeep Krystal Eaton, MD  pantoprazole (PROTONIX) 40 MG tablet Take 1 tablet (40 mg total) by mouth daily. 08/07/15  Yes Colon Branch, MD  phenytoin (DILANTIN) 100 MG ER capsule Take 3 capsules (300 mg total) by mouth daily. 06/06/15  Yes Colon Branch, MD  promethazine (PHENERGAN) 12.5 MG tablet TAKE ONE TABLET BY MOUTH EVERY 6 HOURS AS NEEDED FOR NAUSEA AND VOMITING 09/02/15  Yes Mosie Lukes, MD  saccharomyces boulardii (FLORASTOR) 250 MG capsule Take 1 capsule (250 mg total) by mouth 2 (two) times daily. 05/11/15  Yes Nishant Dhungel, MD  warfarin (COUMADIN) 2.5 MG tablet Take 1.25 mg by mouth daily.   Yes Historical Provider, MD    Allergies:   Allergies  Allergen Reactions  . Procaine Hcl Anaphylaxis    Social History:  reports that she quit smoking about 37 years ago. Her smoking use included Cigarettes. She has never used smokeless tobacco. She reports that she does not drink alcohol or use illicit drugs.  Family History: Family History  Problem Relation Age of Onset  . Diabetes Father   . Heart attack Father 37  . Diabetes  Sister   . Cancer Neg Hx     no hx of colon or breast cancer    Physical Exam: Filed Vitals:   09/14/15 1915 09/14/15 1930 09/14/15 2000 09/14/15 2015  BP: 142/75 152/72 143/56 154/54  Pulse: 71 69 71 72  Temp:      TempSrc:      Resp:      SpO2: 100% 97% 100% 99%    General:  Alert and oriented times three, well developed and nourished, no acute distress Eyes: PERRLA, pink conjunctiva, no scleral icterus ENT: Moist oral mucosa, neck supple, no thyromegaly Lungs: clear to ascultation, no wheeze, no crackles, no use of accessory muscles Cardiovascular: regular rate and rhythm, no regurgitation, no gallops, no murmurs. No carotid bruits, no JVD Abdomen: soft, positive BS, non-tender, non-distended, no organomegaly, not an acute abdomen GU: not examined Neuro: CN II - XII grossly intact, sensation intact Musculoskeletal: Right ankle fracture, splint in place, no clubbing, cyanosis or edema Skin: no rash, no subcutaneous crepitation,  no decubitus Psych: appropriate patient   Labs on Admission:   Recent Labs  09/14/15 1630  NA 131*  K 3.2*  CL 93*  CO2 27  GLUCOSE 119*  BUN 31*  CREATININE 2.06*  CALCIUM 9.1   No results for input(s): AST, ALT, ALKPHOS, BILITOT, PROT, ALBUMIN in the last 72 hours. No results for input(s): LIPASE, AMYLASE in the last 72 hours.  Recent Labs  09/14/15 1630  WBC 12.8*  NEUTROABS 9.3*  HGB 10.6*  HCT 31.5*  MCV 86.8  PLT 284   No results for input(s): CKTOTAL, CKMB, CKMBINDEX, TROPONINI in the last 72 hours. Invalid input(s): POCBNP No results for input(s): DDIMER in the last 72 hours. No results for input(s): HGBA1C in the last 72 hours. No results for input(s): CHOL, HDL, LDLCALC, TRIG, CHOLHDL, LDLDIRECT in the last 72 hours. No results for input(s): TSH, T4TOTAL, T3FREE, THYROIDAB in the last 72 hours.  Invalid input(s): FREET3 No results for input(s): VITAMINB12, FOLATE, FERRITIN, TIBC, IRON, RETICCTPCT in the last 72  hours.  Micro Results: No results found for this or any previous visit (from the past 240 hour(s)).   Radiological Exams on Admission: Dg Tibia/fibula Right  09/14/2015  CLINICAL DATA:  Right lower leg pain after fall today. EXAM: RIGHT TIBIA AND FIBULA - 2 VIEW COMPARISON:  Radiographs of Jul 13, 2012. FINDINGS: Possible minimally displaced fracture involving the posterior aspect of the distal tibia. Ankle radiographs are recommended for further evaluation. Also noted is probable minimally displaced fracture involving the distal right fibula. Vascular calcifications are noted in the soft tissues. IMPRESSION: Possible minimally displaced posterior malleolar fracture of distal tibia. Probable minimally displaced distal right fibular fracture. Ankle radiographs are recommended for further evaluation. Electronically Signed   By: Marijo Conception, M.D.   On: 09/14/2015 15:58   Dg Ankle Complete Right  09/14/2015  CLINICAL DATA:  Lateral right ankle pain and swelling EXAM: RIGHT ANKLE - COMPLETE 3+ VIEW COMPARISON:  None. FINDINGS: There is mild diffuse osteopenia. Lateral soft tissue swelling is identified. A nondisplaced fracture involving the distal fibula is suspected. No additional fractures identified. IMPRESSION: Suspect nondisplaced distal fibular fracture. The possible posterior malleolar fracture is not well visualized on this study. Given the treatment implications a CT of the right ankle would seem reasonable to completely assess the extent of fractures. Electronically Signed   By: Kerby Moors M.D.   On: 09/14/2015 17:05   Ct Ankle Right Wo Contrast  09/14/2015  CLINICAL DATA:  SMA 84-year-old female with fall and right ankle pain and fracture. EXAM: CT OF THE RIGHT ANKLE WITHOUT CONTRAST TECHNIQUE: Multidetector CT imaging of the right ankle was performed according to the standard protocol. Multiplanar CT image reconstructions were also generated. COMPARISON:  Right lower extremity radiograph  dated 09/14/2015 FINDINGS: There is a nondisplaced oblique fracture of the distal fibular with extension into the lateral malleolus. There is a nondisplaced fracture of the posterior malleolus with involvement of the posterior articular surface of the tibial plafond. No other fracture identified. The bones are osteopenic. There is no dislocation. The ankle mortise intact. There is soft swelling around the ankle primarily over the lateral malleolus. No fluid collection no large hematoma. IMPRESSION: Nondisplaced oblique fracture of the distal fibula and lateral malleolus as well as nondisplaced fracture of the posterior malleolus. No dislocation. Electronically Signed   By: Anner Crete M.D.   On: 09/14/2015 18:27    Assessment/Plan Present on Admission:  . Right fibular fractures  -  Admit to MedSurg  -Orthopedics Dr. Lorin Mercy aware -Morphine when necessary pain medication -Due to mechanical fall  Coronary artery disease, recent MI Chronic systolic heart failure EF 35-40% -Stable. Consult cardiology for cardiac clearance  Hypokalemia -Likely secondary to nausea and vomiting. -Repleting IV. BMP in a.m.   Acute on chronic kidney injury -Gentle IV fluid hydration. 750cc only total. Hold tonight's dose of Lasix. Lisinopril has not been held  Atrial fibrillation -On metoprolol and Coumadin. Coumadin held. INR in a.m.  Diabetes mellitus type 2 -ADA diet, sliding-scale insulin -Resume home medications  Hypertension -Stable, resume home medications include beta blockers   Dyslipidemia -Stable, resume home medications  History of seizures -Stable, resume home medications. Dilantin level ordered   Herpes encephalitis -Treated, resolved  Memory issues -Aware, likely secondary to above  Blind in left eye -Aware  History of migraine -Aware   [name] [name] 09/14/2015, 8:45 PM

## 2015-09-14 NOTE — ED Notes (Signed)
CRITICAL VALUE ALERT  Critical value received:  INR 5.17

## 2015-09-14 NOTE — ED Notes (Signed)
Pt to CT at this time.

## 2015-09-14 NOTE — ED Notes (Signed)
Ortho tech paged  

## 2015-09-15 DIAGNOSIS — I5022 Chronic systolic (congestive) heart failure: Secondary | ICD-10-CM

## 2015-09-15 DIAGNOSIS — S82841A Displaced bimalleolar fracture of right lower leg, initial encounter for closed fracture: Principal | ICD-10-CM

## 2015-09-15 DIAGNOSIS — I480 Paroxysmal atrial fibrillation: Secondary | ICD-10-CM

## 2015-09-15 DIAGNOSIS — Z7901 Long term (current) use of anticoagulants: Secondary | ICD-10-CM

## 2015-09-15 DIAGNOSIS — E1159 Type 2 diabetes mellitus with other circulatory complications: Secondary | ICD-10-CM

## 2015-09-15 DIAGNOSIS — I10 Essential (primary) hypertension: Secondary | ICD-10-CM

## 2015-09-15 LAB — GLUCOSE, CAPILLARY
GLUCOSE-CAPILLARY: 162 mg/dL — AB (ref 65–99)
GLUCOSE-CAPILLARY: 211 mg/dL — AB (ref 65–99)
Glucose-Capillary: 200 mg/dL — ABNORMAL HIGH (ref 65–99)
Glucose-Capillary: 222 mg/dL — ABNORMAL HIGH (ref 65–99)

## 2015-09-15 LAB — BASIC METABOLIC PANEL
ANION GAP: 10 (ref 5–15)
BUN: 35 mg/dL — ABNORMAL HIGH (ref 6–20)
CHLORIDE: 94 mmol/L — AB (ref 101–111)
CO2: 26 mmol/L (ref 22–32)
Calcium: 8.5 mg/dL — ABNORMAL LOW (ref 8.9–10.3)
Creatinine, Ser: 2.01 mg/dL — ABNORMAL HIGH (ref 0.44–1.00)
GFR calc Af Amer: 27 mL/min — ABNORMAL LOW (ref >60)
GFR, EST NON AFRICAN AMERICAN: 23 mL/min — AB (ref >60)
Glucose, Bld: 151 mg/dL — ABNORMAL HIGH (ref 65–99)
POTASSIUM: 3.8 mmol/L (ref 3.5–5.1)
SODIUM: 130 mmol/L — AB (ref 135–145)

## 2015-09-15 LAB — CBC
HEMATOCRIT: 30.3 % — AB (ref 36.0–46.0)
HEMOGLOBIN: 9.7 g/dL — AB (ref 12.0–15.0)
MCH: 27.9 pg (ref 26.0–34.0)
MCHC: 32 g/dL (ref 30.0–36.0)
MCV: 87.1 fL (ref 78.0–100.0)
Platelets: 277 10*3/uL (ref 150–400)
RBC: 3.48 MIL/uL — AB (ref 3.87–5.11)
RDW: 14.6 % (ref 11.5–15.5)
WBC: 9.4 10*3/uL (ref 4.0–10.5)

## 2015-09-15 LAB — PROTIME-INR
INR: 5.1 — AB (ref 0.00–1.49)
Prothrombin Time: 45.6 seconds — ABNORMAL HIGH (ref 11.6–15.2)

## 2015-09-15 MED ORDER — ENOXAPARIN SODIUM 40 MG/0.4ML ~~LOC~~ SOLN: 40.0000 mg | SUBCUTANEOUS | Status: DC

## 2015-09-15 MED ORDER — PHENYTOIN SODIUM EXTENDED 100 MG PO CAPS
300.0000 mg | ORAL_CAPSULE | Freq: Once | ORAL | Status: AC
  Administered 2015-09-15: 300 mg via ORAL
  Filled 2015-09-15: qty 3

## 2015-09-15 NOTE — Consult Note (Signed)
Reason for Consult:right bimalleolar ankle fracture Referring Physician: Georgiann Mohs MD  [name] [name] is an 75 y.o. female.  HPI: pt with shaking spells and passed out in the garage with inability to walk. xrays nondisplaced right bimalleolar ankle fracture.   Past Medical History  Diagnosis Date  . Blindness of left eye   . Eye muscle weakness     Right eye weakness after cataract surgery  . Common migraine     ?hx of  . DIABETES MELLITUS, TYPE II 03/27/2006    dr [name]  . HYPERLIPIDEMIA 03/27/2006  . 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 HYPERTENSION 03/27/2006  . Atrial fibrillation ([place])     SVT dx 2007, cath 2007 mild CAD, then had an cardioversion, ablation; still on coumadin , has occ palpitation, EKG 03-2010 NSR  . Headache(784.0) 05/07/2009  . LUNG NODULE 09/01/2006    Excision, Bx [facility]  . Osteopenia 2004    Dexa 2004 showed Osteopenia, DEXA 03/2007 normal  . Seizures ([place])   . Intertrochanteric fracture of right hip ([place]) 07/13/2012  . 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 GERD (gastroesophageal reflux disease) 10/05/2011  . Recurrent urinary tract infection     Seeing Urology  . 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[place]) 4/94/4967  . Other chronic cystitis with hematuria     Past Surgical History  Procedure Laterality Date  . Shoulder surgery    . Femur im nail Right 07/15/2012    Procedure: INTRAMEDULLARY (IM) NAIL HIP;  Surgeon: Johnny Bridge, MD;  Location: [facility];  Service: Orthopedics;  Laterality: Right;  . Cardiac catheterization N/A 05/17/2015    Procedure: Left Heart Cath and Coronary Angiography;  Surgeon: Jettie Booze, MD;  Location: [facility];  Service: Cardiovascular;  Laterality: N/A;  . Cardiac catheterization N/A 05/17/2015    Procedure: Coronary Balloon Angioplasty;  Surgeon: Jettie Booze, MD;  Location: [facility];  Service: Cardiovascular;  Laterality: N/A;    Family History  Problem Relation Age of Onset  . Diabetes Father   . Heart attack  Father 25  . Diabetes Sister   . Cancer Neg Hx     no hx of colon or breast cancer    Social History:  reports that she quit smoking about 37 years ago. Her smoking use included Cigarettes. She has never used smokeless tobacco. She reports that she does not drink alcohol or use illicit drugs.  Allergies:  Allergies  Allergen Reactions  . Procaine Hcl Anaphylaxis    Medications: I have reviewed the patient's current medications.  Results for orders placed or performed during the hospital encounter of 09/14/15 (from the past 48 hour(s))  CBC with Differential/Platelet     Status: Abnormal   Collection Time: 09/14/15  4:30 PM  Result Value Ref Range   WBC 12.8 (H) 4.0 - 10.5 K/uL   RBC 3.63 (L) 3.87 - 5.11 MIL/uL   Hemoglobin 10.6 (L) 12.0 - 15.0 g/dL   HCT 31.5 (L) 36.0 - 46.0 %   MCV 86.8 78.0 - 100.0 fL   MCH 29.2 26.0 - 34.0 pg   MCHC 33.7 30.0 - 36.0 g/dL   RDW 14.3 11.5 - 15.5 %   Platelets 284 150 - 400 K/uL   Neutrophils Relative % 73 %   Neutro Abs 9.3 (H) 1.7 - 7.7 K/uL   Lymphocytes Relative 13 %   Lymphs Abs 1.6 0.7 - 4.0 K/uL   Monocytes Relative 14 %   Monocytes Absolute 1.8 (H) 0.1 -  1.0 K/uL   Eosinophils Relative 0 %   Eosinophils Absolute 0.0 0.0 - 0.7 K/uL   Basophils Relative 0 %   Basophils Absolute 0.0 0.0 - 0.1 K/uL  Basic metabolic panel     Status: Abnormal   Collection Time: 09/14/15  4:30 PM  Result Value Ref Range   Sodium 131 (L) 135 - 145 mmol/L   Potassium 3.2 (L) 3.5 - 5.1 mmol/L   Chloride 93 (L) 101 - 111 mmol/L   CO2 27 22 - 32 mmol/L   Glucose, Bld 119 (H) 65 - 99 mg/dL   BUN 31 (H) 6 - 20 mg/dL   Creatinine, Ser 2.06 (H) 0.44 - 1.00 mg/dL   Calcium 9.1 8.9 - 10.3 mg/dL   GFR calc non Af Amer 23 (L) >60 mL/min   GFR calc Af Amer 26 (L) >60 mL/min    Comment: (NOTE) The eGFR has been calculated using the CKD EPI equation. This calculation has not been validated in all clinical situations. eGFR's persistently <60 mL/min signify  possible Chronic Kidney Disease.    Anion gap 11 5 - 15  Protime-INR     Status: Abnormal   Collection Time: 09/14/15  7:32 PM  Result Value Ref Range   Prothrombin Time 46.1 (H) 11.6 - 15.2 seconds   INR 5.18 (HH) 0.00 - 1.49    Comment: REPEATED TO VERIFY CRITICAL RESULT CALLED TO, READ BACK BY AND VERIFIED WITH: RN LEDION,Y AT 2051 [id number] MARTINB   Phenytoin level, total     Status: Abnormal   Collection Time: 09/14/15  9:56 PM  Result Value Ref Range   Phenytoin Lvl 2.8 (L) 10.0 - 20.0 ug/mL  Glucose, capillary     Status: Abnormal   Collection Time: 09/14/15 10:28 PM  Result Value Ref Range   Glucose-Capillary 170 (H) 65 - 99 mg/dL  Basic metabolic panel     Status: Abnormal   Collection Time: 09/15/15  3:30 AM  Result Value Ref Range   Sodium 130 (L) 135 - 145 mmol/L   Potassium 3.8 3.5 - 5.1 mmol/L   Chloride 94 (L) 101 - 111 mmol/L   CO2 26 22 - 32 mmol/L   Glucose, Bld 151 (H) 65 - 99 mg/dL   BUN 35 (H) 6 - 20 mg/dL   Creatinine, Ser 2.01 (H) 0.44 - 1.00 mg/dL   Calcium 8.5 (L) 8.9 - 10.3 mg/dL   GFR calc non Af Amer 23 (L) >60 mL/min   GFR calc Af Amer 27 (L) >60 mL/min    Comment: (NOTE) The eGFR has been calculated using the CKD EPI equation. This calculation has not been validated in all clinical situations. eGFR's persistently <60 mL/min signify possible Chronic Kidney Disease.    Anion gap 10 5 - 15  CBC     Status: Abnormal   Collection Time: 09/15/15  3:30 AM  Result Value Ref Range   WBC 9.4 4.0 - 10.5 K/uL   RBC 3.48 (L) 3.87 - 5.11 MIL/uL   Hemoglobin 9.7 (L) 12.0 - 15.0 g/dL   HCT 30.3 (L) 36.0 - 46.0 %   MCV 87.1 78.0 - 100.0 fL   MCH 27.9 26.0 - 34.0 pg   MCHC 32.0 30.0 - 36.0 g/dL   RDW 14.6 11.5 - 15.5 %   Platelets 277 150 - 400 K/uL  Protime-INR     Status: Abnormal   Collection Time: 09/15/15  3:33 AM  Result Value Ref Range   Prothrombin Time 45.6 (H)  11.6 - 15.2 seconds   INR 5.10 (HH) 0.00 - 1.49    Comment: REPEATED TO  VERIFY CRITICAL RESULT CALLED TO, READ BACK BY AND VERIFIED WITH: Jillene Bucks AT 2130 09/15/15 BY ZBEECH.   Glucose, capillary     Status: Abnormal   Collection Time: 09/15/15  5:18 AM  Result Value Ref Range   Glucose-Capillary 162 (H) 65 - 99 mg/dL  Glucose, capillary     Status: Abnormal   Collection Time: 09/15/15 12:18 PM  Result Value Ref Range   Glucose-Capillary 211 (H) 65 - 99 mg/dL  Glucose, capillary     Status: Abnormal   Collection Time: 09/15/15  4:27 PM  Result Value Ref Range   Glucose-Capillary 200 (H) 65 - 99 mg/dL  Glucose, capillary     Status: Abnormal   Collection Time: 09/15/15  9:19 PM  Result Value Ref Range   Glucose-Capillary 222 (H) 65 - 99 mg/dL    Dg Tibia/fibula Right  09/14/2015  CLINICAL DATA:  Right lower leg pain after fall today. EXAM: RIGHT TIBIA AND FIBULA - 2 VIEW COMPARISON:  Radiographs of Jul 13, 2012. FINDINGS: Possible minimally displaced fracture involving the posterior aspect of the distal tibia. Ankle radiographs are recommended for further evaluation. Also noted is probable minimally displaced fracture involving the distal right fibula. Vascular calcifications are noted in the soft tissues. IMPRESSION: Possible minimally displaced posterior malleolar fracture of distal tibia. Probable minimally displaced distal right fibular fracture. Ankle radiographs are recommended for further evaluation. Electronically Signed   By: Marijo Conception, M.D.   On: 09/14/2015 15:58   Dg Ankle Complete Right  09/14/2015  CLINICAL DATA:  Lateral right ankle pain and swelling EXAM: RIGHT ANKLE - COMPLETE 3+ VIEW COMPARISON:  None. FINDINGS: There is mild diffuse osteopenia. Lateral soft tissue swelling is identified. A nondisplaced fracture involving the distal fibula is suspected. No additional fractures identified. IMPRESSION: Suspect nondisplaced distal fibular fracture. The possible posterior malleolar fracture is not well visualized on this study. Given  the treatment implications a CT of the right ankle would seem reasonable to completely assess the extent of fractures. Electronically Signed   By: Kerby Moors M.D.   On: 09/14/2015 17:05   Ct Ankle Right Wo Contrast  09/14/2015  CLINICAL DATA:  SMA 68-year-old female with fall and right ankle pain and fracture. EXAM: CT OF THE RIGHT ANKLE WITHOUT CONTRAST TECHNIQUE: Multidetector CT imaging of the right ankle was performed according to the standard protocol. Multiplanar CT image reconstructions were also generated. COMPARISON:  Right lower extremity radiograph dated 09/14/2015 FINDINGS: There is a nondisplaced oblique fracture of the distal fibular with extension into the lateral malleolus. There is a nondisplaced fracture of the posterior malleolus with involvement of the posterior articular surface of the tibial plafond. No other fracture identified. The bones are osteopenic. There is no dislocation. The ankle mortise intact. There is soft swelling around the ankle primarily over the lateral malleolus. No fluid collection no large hematoma. IMPRESSION: Nondisplaced oblique fracture of the distal fibula and lateral malleolus as well as nondisplaced fracture of the posterior malleolus. No dislocation. Electronically Signed   By: Anner Crete M.D.   On: 09/14/2015 18:27    Review of Systems  HENT: Negative for hearing loss.   Eyes: Positive for blurred vision.       Vision problems  Left eye blind  Respiratory: Negative for cough.   Cardiovascular:       Pos. MI EF 35-40%  Musculoskeletal:  Positive for falls.  Neurological: Positive for tremors.       Shaking spells with tremor and falls   Blood pressure 115/39, pulse 73, temperature 97.9 F (36.6 C), temperature source Oral, resp. rate 18, weight 91.853 kg (202 lb 8 oz), SpO2 96 %. Physical Exam  Assessment/Plan: Nondisplaced right lateral and posterior malleolus fractures. She is in excellent allignment. Will tx with NWB and put cast on  in one week. OK for transfers and PT NWB. If her ankle shifts then surgery will be needed.      My cell [phone]  [name] 09/15/2015, 10:55 PM

## 2015-09-15 NOTE — Progress Notes (Signed)
[facility]  PROGRESS NOTE  [name] [name] [id number] DOB: [birth year] DOA: 09/14/2015 PCP: [name], MD    Brief HPI:  75 year old female who has occasional episodes of full body shaking. She states when this happened she just sentences herself on the walls. She has been several doctors none of which can give her reason. This occurred today while she was in the garage. She fell, there is a question was if there was a loss of consciousness. She was unable to get up, her husband was close and he called 911. Imaging revealed a right fibula fracture.   Active Problems:   Bilateral fibular fractures   Assessment/Plan: 1. Right fibular fracture- status post application of splint, orthopedics following. Morphine when necessary for pain. 2. ? Seizure- patient had shaking of right hand and then passed out, Dilantin level is sub therapeutic. Will give loading dose of Dilantin today 300 mg by mouth every 2 hours 2 doses, and then continue Dilantin 300 mg by mouth daily 3. Coronary artery disease- stable, continue metoprolol, Plavix. 4. Chronic systolic CHF- EF [id number], will hold Lasix. 5. Acute on chronic kidney disease, stage III- baseline creatinine around 1.6, today creatinine 2.01, patient takes Lasix at home. Will  hold Lasix at this time and check BMP in a.m. 6. Diabetes mellitus- continue NovoLog mix 70/30, 12 units twice a day, sliding scale insulin with NovoLog. 7. Hypertension- continue lisinopril, metoprolol 8. Atrial fibrillation- heart rate is controlled, continue metoprolol, Coumadin currently on hold due to elevated INR 5.10. Will check daily INRs   DVT prophylaxis: Lovenox Code Status: Full code Family Communication: No family at bedside Disposition Plan: Pending physical therapy evaluation   Consultants:  Orthopedics  Procedures:  Splint applied to right lower extremity  Antibiotics:  None  Subjective: Patient seen and examined, says that she had shaking  in the right hand and then she briefly passed out and fell on the floor. She has a history of seizures, Dilantin level is subtherapeutic today.  Objective: Filed Vitals:   09/14/15 2107 09/14/15 2127 09/15/15 0005 09/15/15 0514  BP: 149/61  131/43 139/42  Pulse: 73  64 69  Temp: 97.7 F (36.5 C)   98.7 F (37.1 C)  TempSrc: Oral   Oral  Resp:      Weight:  91.853 kg (202 lb 8 oz)    SpO2: 99%  92% 92%   No intake or output data in the 24 hours ending 09/15/15 1149 Filed Weights   09/14/15 2127  Weight: 91.853 kg (202 lb 8 oz)    Examination:  General exam: Appears calm and comfortable  Respiratory system: Clear to auscultation. Respiratory effort normal. Cardiovascular system: S1 & S2 heard, RRR. No JVD, murmurs, rubs, gallops or clicks. No pedal edema. Gastrointestinal system: Abdomen is nondistended, soft and nontender. No organomegaly or masses felt. Normal bowel sounds heard. Central nervous system: Alert and oriented. No focal neurological deficits. Extremities: Splint in  right lower extremity Skin: No rashes, lesions or ulcers Psychiatry: Judgement and insight appear normal. Mood & affect appropriate.    Data Reviewed: I have personally reviewed following labs and imaging studies Basic Metabolic Panel:  Recent Labs Lab 09/14/15 1630 09/15/15 0330  NA 131* 130*  K 3.2* 3.8  CL 93* 94*  CO2 27 26  GLUCOSE 119* 151*  BUN 31* 35*  CREATININE 2.06* 2.01*  CALCIUM 9.1 8.5*   CBC:  Recent Labs Lab 09/14/15 1630 09/15/15 0330  WBC 12.8* 9.4  NEUTROABS 9.3*  --  HGB 10.6* 9.7*  HCT 31.5* 30.3*  MCV 86.8 87.1  PLT 284 277   Cardiac Enzymes: No results for input(s): CKTOTAL, CKMB, CKMBINDEX, TROPONINI in the last 168 hours. BNP (last 3 results)  Recent Labs  05/20/15 0610  BNP 1570.5*    ProBNP (last 3 results) No results for input(s): PROBNP in the last 8760 hours.  CBG:  Recent Labs Lab 09/14/15 2228 09/15/15 0518  GLUCAP 170* 162*     No results found for this or any previous visit (from the past 240 hour(s)).   Studies: Dg Tibia/fibula Right  09/14/2015  CLINICAL DATA:  Right lower leg pain after fall today. EXAM: RIGHT TIBIA AND FIBULA - 2 VIEW COMPARISON:  Radiographs of Jul 13, 2012. FINDINGS: Possible minimally displaced fracture involving the posterior aspect of the distal tibia. Ankle radiographs are recommended for further evaluation. Also noted is probable minimally displaced fracture involving the distal right fibula. Vascular calcifications are noted in the soft tissues. IMPRESSION: Possible minimally displaced posterior malleolar fracture of distal tibia. Probable minimally displaced distal right fibular fracture. Ankle radiographs are recommended for further evaluation. Electronically Signed   By: Marijo Conception, M.D.   On: 09/14/2015 15:58   Dg Ankle Complete Right  09/14/2015  CLINICAL DATA:  Lateral right ankle pain and swelling EXAM: RIGHT ANKLE - COMPLETE 3+ VIEW COMPARISON:  None. FINDINGS: There is mild diffuse osteopenia. Lateral soft tissue swelling is identified. A nondisplaced fracture involving the distal fibula is suspected. No additional fractures identified. IMPRESSION: Suspect nondisplaced distal fibular fracture. The possible posterior malleolar fracture is not well visualized on this study. Given the treatment implications a CT of the right ankle would seem reasonable to completely assess the extent of fractures. Electronically Signed   By: Kerby Moors M.D.   On: 09/14/2015 17:05   Ct Ankle Right Wo Contrast  09/14/2015  CLINICAL DATA:  SMA 24-year-old female with fall and right ankle pain and fracture. EXAM: CT OF THE RIGHT ANKLE WITHOUT CONTRAST TECHNIQUE: Multidetector CT imaging of the right ankle was performed according to the standard protocol. Multiplanar CT image reconstructions were also generated. COMPARISON:  Right lower extremity radiograph dated 09/14/2015 FINDINGS: There is a  nondisplaced oblique fracture of the distal fibular with extension into the lateral malleolus. There is a nondisplaced fracture of the posterior malleolus with involvement of the posterior articular surface of the tibial plafond. No other fracture identified. The bones are osteopenic. There is no dislocation. The ankle mortise intact. There is soft swelling around the ankle primarily over the lateral malleolus. No fluid collection no large hematoma. IMPRESSION: Nondisplaced oblique fracture of the distal fibula and lateral malleolus as well as nondisplaced fracture of the posterior malleolus. No dislocation. Electronically Signed   By: Anner Crete M.D.   On: 09/14/2015 18:27    Scheduled Meds: . atorvastatin  80 mg Oral QHS  . escitalopram  20 mg Oral Daily  . furosemide  40 mg Oral BID  . insulin aspart  0-15 Units Subcutaneous TID WC  . insulin aspart  0-5 Units Subcutaneous QHS  . insulin aspart protamine- aspart  12 Units Subcutaneous BID WC  . lisinopril  5 mg Oral Daily  . metoprolol succinate  50 mg Oral Daily  . pantoprazole  40 mg Oral Daily  . phenytoin  300 mg Oral Daily  . phenytoin  300 mg Oral Once  . saccharomyces boulardii  250 mg Oral BID   Continuous Infusions: . 0.9 % NaCl with  KCl 20 mEq / L 75 mL/hr at 09/14/15 2200    Time spent: 25 min    [facility] Pager [phone]. If 7PM-7AM, please contact night-coverage at [email or web address], Office  [phone]  password [email or web address] 09/15/2015, 11:49 AM  LOS: 1 day

## 2015-09-16 ENCOUNTER — Inpatient Hospital Stay (HOSPITAL_COMMUNITY): Payer: Medicare Other

## 2015-09-16 ENCOUNTER — Ambulatory Visit: Payer: Medicare Other | Admitting: Endocrinology

## 2015-09-16 DIAGNOSIS — G40909 Epilepsy, unspecified, not intractable, without status epilepticus: Secondary | ICD-10-CM

## 2015-09-16 DIAGNOSIS — I110 Hypertensive heart disease with heart failure: Secondary | ICD-10-CM

## 2015-09-16 LAB — URINALYSIS, ROUTINE W REFLEX MICROSCOPIC
BILIRUBIN URINE: NEGATIVE
Glucose, UA: NEGATIVE mg/dL
Ketones, ur: NEGATIVE mg/dL
Nitrite: NEGATIVE
PROTEIN: 30 mg/dL — AB
Specific Gravity, Urine: 1.014 (ref 1.005–1.030)
pH: 7 (ref 5.0–8.0)

## 2015-09-16 LAB — GLUCOSE, CAPILLARY
GLUCOSE-CAPILLARY: 164 mg/dL — AB (ref 65–99)
GLUCOSE-CAPILLARY: 261 mg/dL — AB (ref 65–99)
Glucose-Capillary: 127 mg/dL — ABNORMAL HIGH (ref 65–99)
Glucose-Capillary: 50 mg/dL — ABNORMAL LOW (ref 65–99)
Glucose-Capillary: 98 mg/dL (ref 65–99)

## 2015-09-16 LAB — PROTIME-INR
INR: 4.75 (ref 0.00–1.49)
Prothrombin Time: 43.3 seconds — ABNORMAL HIGH (ref 11.6–15.2)

## 2015-09-16 LAB — PHENYTOIN LEVEL, TOTAL: Phenytoin Lvl: 2.8 ug/mL — ABNORMAL LOW (ref 10.0–20.0)

## 2015-09-16 LAB — URINE MICROSCOPIC-ADD ON

## 2015-09-16 MED ORDER — INSULIN ASPART PROT & ASPART (70-30 MIX) 100 UNIT/ML ~~LOC~~ SUSP
14.0000 [IU] | Freq: Two times a day (BID) | SUBCUTANEOUS | Status: DC
  Administered 2015-09-16 – 2015-09-17 (×3): 14 [IU] via SUBCUTANEOUS

## 2015-09-16 NOTE — Care Management Important Message (Signed)
Important Message  Patient Details  Name: [name] [name] MRN: [id number] Date of Birth: June 25, 1940   Medicare Important Message Given:  Yes    Loann Quill 09/16/2015, 3:04 PM

## 2015-09-16 NOTE — Progress Notes (Addendum)
Inpatient Diabetes Program Recommendations  AACE/ADA: New Consensus Statement on Inpatient Glycemic Control (2015)  Target Ranges:  Prepandial:   less than 140 mg/dL      Peak postprandial:   less than 180 mg/dL (1-2 hours)      Critically ill patients:  140 - 180 mg/dL  Results for [name] [name], [name] [name] (MRN [id number]) as of 09/16/2015 10:39  Ref. Range 09/15/2015 05:18 09/15/2015 12:18 09/15/2015 16:27 09/15/2015 21:19 09/16/2015 06:15  Glucose-Capillary Latest Ref Range: 65-99 mg/dL 162 (H) 211 (H) 200 (H) 222 (H) 164 (H)    Review of Glycemic Control  Diabetes history: DM2 Outpatient Diabetes medications: 70/30 30 units QAM, 70/30 12 units QPM Current orders for Inpatient glycemic control: 70/30 12 units BID, Novolog 0-15 units TID with meals, Novolog 0-5 units QHS  Inpatient Diabetes Program Recommendations: Insulin - Basal: Please consider inreasing 70/30 to 14 units BID with meals.  Thanks, Barnie Alderman, RN, MSN, CDE Diabetes Coordinator Inpatient Diabetes Program [phone] (Team Pager from [place] to [place]) [phone] ([place] office) [phone] [facility] office) [phone] [facility] office)

## 2015-09-16 NOTE — Progress Notes (Signed)
EEG completed; results pending.    

## 2015-09-16 NOTE — Procedures (Signed)
ELECTROENCEPHALOGRAM REPORT  Date of Study: 09/16/2015  Patient's Name: [name] [name] MRN: [id number] Date of Birth: [birth year]  Referring Provider: Dr. Eleonore Chiquito  Clinical History: This is a 75 year old woman with occasional episodes of full body shaking.   Medications: Dilantin, Coumadin, lipitor, Lasix, Metroprolol, insulin, Plavix  Technical Summary: A multichannel digital EEG recording measured by the international 10-20 system with electrodes applied with paste and impedances below 5000 ohms performed in our laboratory with EKG monitoring in a predominantly drowsy and asleep patient.  Hyperventilation was not performed. Photic stimulation was performed.  The digital EEG was referentially recorded, reformatted, and digitally filtered in a variety of bipolar and referential montages for optimal display.    Description: The patient is predominantly drowsy and asleep during the recording.  During brief period of wakefulness, there is no clear posterior dominant rhythm. The background is symmetric.  During drowsiness and sleep, there is an increase in theta and delta slowing of the background with occasional vertex waves seen.  Photic stimulation did not elicit any abnormalities.  There were no epileptiform discharges or electrographic seizures seen.    EKG lead was unremarkable.  Impression: This predominantly drowsy and asleep EEG is normal.    Clinical Correlation: A normal EEG does not exclude a clinical diagnosis of epilepsy.  If further clinical questions remain, prolonged EEG may be helpful.  Clinical correlation is advised.   Ellouise Newer, M.D.

## 2015-09-16 NOTE — Progress Notes (Signed)
[facility]  PROGRESS NOTE  [name] [name] [id number] DOB: 03/08/1940 DOA: 09/14/2015 PCP: [name], MD    Brief HPI:  75 year old female who has occasional episodes of full body shaking. She states when this happened she just sentences herself on the walls. She has been several doctors none of which can give her reason. This occurred today while she was in the garage. She fell, there is a question was if there was a loss of consciousness. She was unable to get up, her husband was close and he called 911. Imaging revealed a right fibula fracture.   Active Problems:   Essential hypertension   Atrial fibrillation (HCC)   Long term (current) use of anticoagulants   Seizure disorder (HCC)   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[place])   Type 2 diabetes mellitus with vascular disease (HCC)   Hypertensive heart disease   PAF (paroxysmal atrial fibrillation) (HCC)   Bilateral fibular fractures   Ankle fracture, bimalleolar, closed   Assessment/Plan: 1. Right fibular fracture- status post application of splint, orthopedics following. Morphine when necessary for pain. 2. ? Seizure- patient had shaking of right hand and then passed out, Dilantin level is sub therapeutic. Was given oading dose of Dilantin 300 mg by mouth every 2 hours 2 doses, and then continue Dilantin 300 mg by mouth daily 3. Coronary artery disease- stable, continue metoprolol, Plavix. 4. Chronic systolic CHF- EF [id number], will hold Lasix. 5. Acute on chronic kidney disease, stage III- baseline creatinine around 1.6, today creatinine 2.01, patient takes Lasix at home. Will  hold Lasix at this time and check BMP in a.m. 6. Diabetes mellitus-increase NovoLog mix 70/30, 14 units twice a day, sliding scale insulin with NovoLog. 7. Hypertension- continue lisinopril, metoprolol 8. Atrial fibrillation- heart rate is controlled, continue metoprolol, Coumadin currently on hold due to elevated INR . Will check daily INRs.   DVT prophylaxis:  Lovenox Code Status: Full code Family Communication: No family at bedside Disposition Plan: Pending physical therapy evaluation   Consultants:  Orthopedics  Procedures:  Splint applied to right lower extremity  Antibiotics:  None  Subjective: Patient seen and examined, says that she had shaking in the right hand and then she briefly passed out and fell on the floor. She has a history of seizures. Dialntin level found to be subtherapeutic. Loading dose given yesterday, EEG pending.  Objective: Filed Vitals:   09/15/15 2035 09/16/15 0555 09/16/15 0956 09/16/15 1300  BP: 120/54 144/73 158/67 150/60  Pulse: 75 70 72 64  Temp: 98.1 F (36.7 C) 98.2 F (36.8 C) 98.4 F (36.9 C) 98.3 F (36.8 C)  TempSrc: Oral Oral Oral Oral  Resp: 16 16 16 16   Weight:      SpO2: 97% 97% 97% 96%   No intake or output data in the 24 hours ending 09/16/15 1450 Filed Weights   09/14/15 2127  Weight: 91.853 kg (202 lb 8 oz)    Examination:  General exam: Appears calm and comfortable  Respiratory system: Clear to auscultation. Respiratory effort normal. Cardiovascular system: S1 & S2 heard, RRR. No JVD, murmurs, rubs, gallops or clicks. No pedal edema. Gastrointestinal system: Abdomen is nondistended, soft and nontender. No organomegaly or masses felt. Normal bowel sounds heard. Central nervous system: Alert and oriented. No focal neurological deficits. Extremities: Splint in  right lower extremity Skin: No rashes, lesions or ulcers Psychiatry: Judgement and insight appear normal. Mood & affect appropriate.    Data Reviewed: I have personally reviewed following labs and imaging studies Basic  Metabolic Panel:  Recent Labs Lab 09/14/15 1630 09/15/15 0330  NA 131* 130*  K 3.2* 3.8  CL 93* 94*  CO2 27 26  GLUCOSE 119* 151*  BUN 31* 35*  CREATININE 2.06* 2.01*  CALCIUM 9.1 8.5*   CBC:  Recent Labs Lab 09/14/15 1630 09/15/15 0330  WBC 12.8* 9.4  NEUTROABS 9.3*  --   HGB  10.6* 9.7*  HCT 31.5* 30.3*  MCV 86.8 87.1  PLT 284 277   Cardiac Enzymes: No results for input(s): CKTOTAL, CKMB, CKMBINDEX, TROPONINI in the last 168 hours. BNP (last 3 results)  Recent Labs  05/20/15 0610  BNP 1570.5*    ProBNP (last 3 results) No results for input(s): PROBNP in the last 8760 hours.  CBG:  Recent Labs Lab 09/15/15 1218 09/15/15 1627 09/15/15 2119 09/16/15 0615 09/16/15 1215  GLUCAP 211* 200* 222* 164* 261*    No results found for this or any previous visit (from the past 240 hour(s)).   Studies: Dg Tibia/fibula Right  09/14/2015  CLINICAL DATA:  Right lower leg pain after fall today. EXAM: RIGHT TIBIA AND FIBULA - 2 VIEW COMPARISON:  Radiographs of Jul 13, 2012. FINDINGS: Possible minimally displaced fracture involving the posterior aspect of the distal tibia. Ankle radiographs are recommended for further evaluation. Also noted is probable minimally displaced fracture involving the distal right fibula. Vascular calcifications are noted in the soft tissues. IMPRESSION: Possible minimally displaced posterior malleolar fracture of distal tibia. Probable minimally displaced distal right fibular fracture. Ankle radiographs are recommended for further evaluation. Electronically Signed   By: Marijo Conception, M.D.   On: 09/14/2015 15:58   Dg Ankle Complete Left  09/16/2015  CLINICAL DATA:  Pain following fall EXAM: LEFT ANKLE COMPLETE - 3+ VIEW COMPARISON:  None. FINDINGS: Frontal, oblique, and lateral views were obtained. There is a comminuted fracture of the distal fibular diaphysis with fracture fragments in overall near anatomic alignment. No other fractures are evident. No dislocation or appreciable joint effusion. The ankle mortise appears intact. There is no appreciable joint space narrowing. IMPRESSION: Comminuted fracture distal fibular diaphysis with fracture fragments in overall near anatomic alignment. Electronically Signed   By: Lowella Grip III M.D.    On: 09/16/2015 11:48   Dg Ankle Complete Right  09/14/2015  CLINICAL DATA:  Lateral right ankle pain and swelling EXAM: RIGHT ANKLE - COMPLETE 3+ VIEW COMPARISON:  None. FINDINGS: There is mild diffuse osteopenia. Lateral soft tissue swelling is identified. A nondisplaced fracture involving the distal fibula is suspected. No additional fractures identified. IMPRESSION: Suspect nondisplaced distal fibular fracture. The possible posterior malleolar fracture is not well visualized on this study. Given the treatment implications a CT of the right ankle would seem reasonable to completely assess the extent of fractures. Electronically Signed   By: Kerby Moors M.D.   On: 09/14/2015 17:05   Ct Ankle Right Wo Contrast  09/14/2015  CLINICAL DATA:  SMA 80-year-old female with fall and right ankle pain and fracture. EXAM: CT OF THE RIGHT ANKLE WITHOUT CONTRAST TECHNIQUE: Multidetector CT imaging of the right ankle was performed according to the standard protocol. Multiplanar CT image reconstructions were also generated. COMPARISON:  Right lower extremity radiograph dated 09/14/2015 FINDINGS: There is a nondisplaced oblique fracture of the distal fibular with extension into the lateral malleolus. There is a nondisplaced fracture of the posterior malleolus with involvement of the posterior articular surface of the tibial plafond. No other fracture identified. The bones are osteopenic. There is no dislocation.  The ankle mortise intact. There is soft swelling around the ankle primarily over the lateral malleolus. No fluid collection no large hematoma. IMPRESSION: Nondisplaced oblique fracture of the distal fibula and lateral malleolus as well as nondisplaced fracture of the posterior malleolus. No dislocation. Electronically Signed   By: Anner Crete M.D.   On: 09/14/2015 18:27   Dg Foot Complete Left  09/16/2015  CLINICAL DATA:  75 year old female with history of trauma from a fall several days ago complaining of  left foot and ankle pain. EXAM: LEFT FOOT - COMPLETE 3+ VIEW COMPARISON:  No priors. FINDINGS: Three views of the left foot demonstrate no acute displaced fracture, subluxation or dislocation in the foot. There is, however, a nondisplaced spiral type fracture of the distal third of the fibular diaphysis shortly above the lateral malleolus. IMPRESSION: 1. Nondisplaced fracture of the distal third of the fibular diaphysis. 2. No acute traumatic findings in the foot. Electronically Signed   By: Vinnie Langton M.D.   On: 09/16/2015 11:46    Scheduled Meds: . atorvastatin  80 mg Oral QHS  . escitalopram  20 mg Oral Daily  . insulin aspart  0-15 Units Subcutaneous TID WC  . insulin aspart  0-5 Units Subcutaneous QHS  . insulin aspart protamine- aspart  12 Units Subcutaneous BID WC  . lisinopril  5 mg Oral Daily  . metoprolol succinate  50 mg Oral Daily  . pantoprazole  40 mg Oral Daily  . phenytoin  300 mg Oral Daily  . saccharomyces boulardii  250 mg Oral BID   Continuous Infusions: . 0.9 % NaCl with KCl 20 mEq / L 75 mL/hr at 09/14/15 2200    Time spent: 25 min    [facility] Pager [phone]. If 7PM-7AM, please contact night-coverage at [email or web address], Office  [phone]  password [email or web address] 09/16/2015, 2:50 PM  LOS: 2 days

## 2015-09-16 NOTE — Progress Notes (Signed)
Pt urine has foul strong odor; MD notified and new order received for UA and culture. Delia Heady RN

## 2015-09-16 NOTE — Progress Notes (Signed)
Subjective: Patient doing ok.  States that she wants to be able to get up and walk but has pain in left foot and ankle.  Splint on right for bimalleolar fracture.   Left ankle/foot pain started after fall.    Objective: Vital signs in last 24 hours: Temp:  [97.9 F (36.6 C)-98.5 F (36.9 C)] 98.2 F (36.8 C) (07/17 0555) Pulse Rate:  [68-75] 70 (07/17 0555) Resp:  [16-18] 16 (07/17 0555) BP: (115-144)/(39-73) 144/73 mmHg (07/17 0555) SpO2:  [95 %-97 %] 97 % (07/17 0555)  Intake/Output from previous day:   Intake/Output this shift:     Recent Labs  09/14/15 1630 09/15/15 0330  HGB 10.6* 9.7*    Recent Labs  09/14/15 1630 09/15/15 0330  WBC 12.8* 9.4  RBC 3.63* 3.48*  HCT 31.5* 30.3*  PLT 284 277    Recent Labs  09/14/15 1630 09/15/15 0330  NA 131* 130*  K 3.2* 3.8  CL 93* 94*  CO2 27 26  BUN 31* 35*  CREATININE 2.06* 2.01*  GLUCOSE 119* 151*  CALCIUM 9.1 8.5*    Recent Labs  09/15/15 0333 09/16/15 0308  INR 5.10* 4.75*    Exam:  Pleasant wf, alert and oriented.  NAD.  Splint right LE intact.  Moves toes well.  Left foot some swelling.  Mod-marked tenderness left ankle and mid foot.    Assessment/Plan: Will get xray left ankle and foot to r/o fracture. Will hold PT until xrays complete.  [name] [name] M 09/16/2015, 9:46 AM

## 2015-09-17 DIAGNOSIS — S82892A Other fracture of left lower leg, initial encounter for closed fracture: Secondary | ICD-10-CM

## 2015-09-17 LAB — GLUCOSE, CAPILLARY
GLUCOSE-CAPILLARY: 268 mg/dL — AB (ref 65–99)
GLUCOSE-CAPILLARY: 140 mg/dL — AB (ref 65–99)
Glucose-Capillary: 273 mg/dL — ABNORMAL HIGH (ref 65–99)
Glucose-Capillary: 88 mg/dL (ref 65–99)

## 2015-09-17 LAB — BASIC METABOLIC PANEL
ANION GAP: 10 (ref 5–15)
BUN: 24 mg/dL — AB (ref 6–20)
CO2: 27 mmol/L (ref 22–32)
Calcium: 8.8 mg/dL — ABNORMAL LOW (ref 8.9–10.3)
Chloride: 98 mmol/L — ABNORMAL LOW (ref 101–111)
Creatinine, Ser: 1.49 mg/dL — ABNORMAL HIGH (ref 0.44–1.00)
GFR, EST AFRICAN AMERICAN: 39 mL/min — AB (ref >60)
GFR, EST NON AFRICAN AMERICAN: 34 mL/min — AB (ref >60)
Glucose, Bld: 104 mg/dL — ABNORMAL HIGH (ref 65–99)
POTASSIUM: 3.7 mmol/L (ref 3.5–5.1)
SODIUM: 135 mmol/L (ref 135–145)

## 2015-09-17 LAB — PROTIME-INR
INR: 3.67 — AB (ref 0.00–1.49)
Prothrombin Time: 35.6 seconds — ABNORMAL HIGH (ref 11.6–15.2)

## 2015-09-17 LAB — MAGNESIUM: Magnesium: 1.7 mg/dL (ref 1.7–2.4)

## 2015-09-17 MED ORDER — DEXTROSE 5 % IV SOLN
1.0000 g | INTRAVENOUS | Status: DC
  Administered 2015-09-17: 1 g via INTRAVENOUS
  Filled 2015-09-17 (×2): qty 10

## 2015-09-17 MED ORDER — MAGNESIUM OXIDE 400 (241.3 MG) MG PO TABS
400.0000 mg | ORAL_TABLET | Freq: Every day | ORAL | Status: DC
  Administered 2015-09-17 – 2015-09-18 (×2): 400 mg via ORAL
  Filled 2015-09-17 (×2): qty 1

## 2015-09-17 MED ORDER — SODIUM CHLORIDE 0.9 % IV SOLN
1000.0000 mg | Freq: Once | INTRAVENOUS | Status: AC
  Administered 2015-09-17: 1000 mg via INTRAVENOUS
  Filled 2015-09-17: qty 20

## 2015-09-17 MED ORDER — BACLOFEN 10 MG PO TABS
5.0000 mg | ORAL_TABLET | Freq: Three times a day (TID) | ORAL | Status: DC | PRN
Start: 2015-09-17 — End: 2015-09-18

## 2015-09-17 NOTE — NC FL2 (Signed)
[place] LEVEL OF CARE SCREENING TOOL     IDENTIFICATION  Patient Name: [name] [name] Birthdate: Mar 25, 1940 Sex: female Admission Date (Current Location): 09/14/2015  [facility] [place] Number:  Herbalist and Address:  [facility] [place] [address], [place], [place] [address]      Provider Number: [id number]  Attending Physician Name and Address:  Oswald Hillock, MD  Relative Name and Phone Number:       Current Level of Care: Hospital Recommended Level of Care: [place] Prior Approval Number:    Date Approved/Denied:   PASRR Number: [id number]  Discharge Plan: SNF    Current Diagnoses: Patient Active Problem List   Diagnosis Date Noted  . Ankle fracture, left 09/17/2015  . Ankle fracture, bimalleolar, closed   . Bilateral fibular fractures 09/14/2015  . Hypertensive heart disease 05/20/2015  . CAD (coronary artery disease) 05/20/2015  . Ischemic cardiomyopathy 05/20/2015  . Acute systolic CHF (congestive heart failure) ([place]) 05/20/2015  . PAF (paroxysmal atrial fibrillation) ([place]) 05/20/2015  . Anticoagulated on Coumadin 05/20/2015  . Type 2 diabetes mellitus with vascular disease ([place]) 05/17/2015  . Non-ST elevated myocardial infarction ([place]) 05/17/2015  . NSTEMI (non-ST elevated myocardial infarction) ([place])   . Non-ST elevation MI (NSTEMI) ([place]) 05/16/2015  . Bacteremia due to Escherichia coli 05/11/2015  . Acute kidney injury ([place]) 05/11/2015  . Uncontrolled hypertension 05/11/2015  . 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[place]) [id number]  . Pyelonephritis 05/06/2015  . Atrial fibrillation with RVR ([place]) 05/06/2015  . Nausea vomiting and diarrhea 05/06/2015  . PCP NOTES >>>>>>>>>>>>>>>> 12/28/2014  . Depression 05/17/2014  . Memory loss 05/17/2014  . Diabetes mellitus-PCP comments 01/23/2014  . A-fib ([place]) 01/23/2014  . Anxiety and depression, PCP notes  11/24/2013  . Severe obesity (BMI >= 40) ([place]) 07/14/2013   . Encounter for therapeutic drug monitoring 06/02/2013  . Dilantin toxicity 11/21/2012  . History of encephalitis 11/18/2012  . Localization-related (focal) (partial) epilepsy and epileptic syndromes with complex partial seizures, without mention of intractable epilepsy 09/27/2012  . Hyponatremia 07/16/2012  . Intertrochanteric fracture of right hip ([place]) 07/13/2012  . DM (diabetes mellitus) ([place]) 01/19/2012  . GERD (gastroesophageal reflux disease) 10/05/2011  . Shoulder pain 08/27/2011  . Seizure disorder ([place]) 05/24/2011  . UTI (lower urinary tract infection) 05/23/2011  . Annual physical exam 02/10/2011  . Long term (current) use of anticoagulants 09/23/2010  . Atrial fibrillation ([place])   . 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 Hyperlipidemia 03/27/2006  . PERIPHERAL NEUROPATHY 03/27/2006  . 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 Essential hypertension 03/27/2006  . OSTEOPENIA 03/27/2006    Orientation RESPIRATION BLADDER Height & Weight     Self, Time, Situation, Place  Normal Continent Weight: 202 lb 8 oz (91.853 kg) Height:     BEHAVIORAL SYMPTOMS/MOOD NEUROLOGICAL BOWEL NUTRITION STATUS   (None) Convulsions/Seizures Continent Diet (Heart healthy carb-modified)  AMBULATORY STATUS COMMUNICATION OF NEEDS Skin   Extensive Assist Verbally Normal                       Personal Care Assistance Level of Assistance  Bathing, Feeding, Dressing Bathing Assistance: Limited assistance Feeding assistance: Independent Dressing Assistance: Limited assistance     Functional Limitations Info  Sight, Hearing, Speech Sight Info: Adequate Hearing Info: Adequate Speech Info: Adequate    SPECIAL CARE FACTORS FREQUENCY  Blood pressure, PT (By licensed PT), Diabetic urine testing     PT Frequency: 5 x week  Contractures Contractures Info: Not present    Additional Factors Info  Code Status, Allergies, Psychotropic Code Status Info: Full Allergies Info: Procaine  Hcl Psychotropic Info: Anxiety, Depression         Current Medications (09/17/2015):  This is the current hospital active medication list Current Facility-Administered Medications  Medication Dose Route Frequency Provider Last Rate Last Dose  . 0.9 % NaCl with KCl 20 mEq/ L  infusion   Intravenous Continuous Oswald Hillock, MD   Stopped at 09/17/15 1318  . acetaminophen (TYLENOL) tablet 650 mg  650 mg Oral Q6H PRN Debby Crosley, MD       Or  . acetaminophen (TYLENOL) suppository 650 mg  650 mg Rectal Q6H PRN Debby Crosley, MD      . atorvastatin (LIPITOR) tablet 80 mg  80 mg Oral QHS Debby Crosley, MD   80 mg at 09/16/15 2149  . cefTRIAXone (ROCEPHIN) 1 g in dextrose 5 % 50 mL IVPB  1 g Intravenous Q24H Oswald Hillock, MD   1 g at 09/17/15 1559  . escitalopram (LEXAPRO) tablet 20 mg  20 mg Oral Daily Debby Crosley, MD   20 mg at 09/17/15 1028  . insulin aspart (novoLOG) injection 0-15 Units  0-15 Units Subcutaneous TID WC Quintella Baton, MD   8 Units at 09/17/15 1218  . insulin aspart (novoLOG) injection 0-5 Units  0-5 Units Subcutaneous QHS Quintella Baton, MD   2 Units at 09/15/15 2207  . insulin aspart protamine- aspart (NOVOLOG MIX 70/30) injection 14 Units  14 Units Subcutaneous BID WC Oswald Hillock, MD   14 Units at 09/17/15 1032  . lisinopril (PRINIVIL,ZESTRIL) tablet 5 mg  5 mg Oral Daily Debby Crosley, MD   5 mg at 09/17/15 1028  . LORazepam (ATIVAN) tablet 0.5 mg  0.5 mg Oral BID PRN Quintella Baton, MD   0.5 mg at 09/14/15 2248  . metoprolol succinate (TOPROL-XL) 24 hr tablet 50 mg  50 mg Oral Daily Debby Crosley, MD   50 mg at 09/17/15 1028  . morphine 2 MG/ML injection 2 mg  2 mg Intravenous Q4H PRN Quintella Baton, MD   2 mg at 09/14/15 2248  . nitroGLYCERIN (NITROSTAT) SL tablet 0.4 mg  0.4 mg Sublingual Q5 min PRN Debby Crosley, MD      . ondansetron (ZOFRAN) tablet 4 mg  4 mg Oral Q6H PRN Debby Crosley, MD       Or  . ondansetron (ZOFRAN) injection 4 mg  4 mg Intravenous Q6H PRN Debby  Crosley, MD      . pantoprazole (PROTONIX) EC tablet 40 mg  40 mg Oral Daily Debby Crosley, MD   40 mg at 09/17/15 1027  . phenytoin (DILANTIN) ER capsule 300 mg  300 mg Oral Daily Debby Crosley, MD   300 mg at 09/17/15 1028  . saccharomyces boulardii (FLORASTOR) capsule 250 mg  250 mg Oral BID Debby Crosley, MD   250 mg at 09/17/15 1028  . senna-docusate (Senokot-S) tablet 1 tablet  1 tablet Oral QHS PRN Quintella Baton, MD         Discharge Medications: Please see discharge summary for a list of discharge medications.  Relevant Imaging Results:  Relevant Lab Results:   Additional Information SS#: [id number]  Candie Chroman, LCSW

## 2015-09-17 NOTE — Progress Notes (Signed)
[facility]  PROGRESS NOTE  [name] [name] [id number] DOB: 1940-07-02 DOA: 09/14/2015 PCP: [name], MD    Brief HPI:  75 year old female who has occasional episodes of full body shaking. She states when this happened she just sentences herself on the walls. She has been several doctors none of which can give her reason. This occurred today while she was in the garage. She fell, there is a question was if there was a loss of consciousness. She was unable to get up, her husband was close and he called 911. Imaging revealed a right fibula fracture.   Active Problems:   Essential hypertension   Atrial fibrillation (HCC)   Long term (current) use of anticoagulants   Seizure disorder (HCC)   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HCC)   Type 2 diabetes mellitus with vascular disease (HCC)   Hypertensive heart disease   PAF (paroxysmal atrial fibrillation) (HCC)   Bilateral fibular fractures   Ankle fracture, bimalleolar, closed   Ankle fracture, left   Assessment/Plan: 1. Bilateral  fibular fracture- status post application of splint, and CAM boot in left leg.orthopedics following. Morphine when necessary for pain. 2. ? Seizure- patient had shaking of right hand and then passed out at home, Dilantin level is sub therapeutic. Was given oading dose of Dilantin 300 mg by mouth every 2 hours 2 doses, and then continue Dilantin 300 mg by mouth daily. This morning again dilantin level is 2.8, will give 1 gram phenytoin and check dilantin level in am. EEG is normal. 3. UTI- has abnormal UA, will check urine culture. Start ceftriaxone 1 g daily. 4. Coronary artery disease- stable, continue metoprolol, Plavix. 5. Chronic systolic CHF- EF [id number], will hold Lasix. 6. Acute on chronic kidney disease, stage III- baseline creatinine around 1.6, today creatinine 1.49, patient takes Lasix at home. Will restart lasix at low dose 40 mg daily. 7. Diabetes mellitus-increase NovoLog mix 70/30, 14 units twice a day,  sliding scale insulin with NovoLog. 8. Hypertension- continue lisinopril, metoprolol 9. Atrial fibrillation- heart rate is controlled, continue metoprolol, Coumadin currently on hold due to elevated INR . Will check daily INRs.   DVT prophylaxis: Lovenox Code Status: Full code Family Communication: No family at bedside Disposition Plan: SNF   Consultants:  Orthopedics  Procedures:  Splint applied to right lower extremity  Antibiotics:  None  Subjective: Patient seen and examined, says that she had shaking in the right hand and then she briefly passed out and fell on the floor at home. She has a history of seizures. Dialntin level found to be subtherapeutic. Loading dose given yesterday, EEG negative. Has abnormal UA.  Objective: Filed Vitals:   09/16/15 0956 09/16/15 1300 09/16/15 2003 09/17/15 0500  BP: 158/67 150/60 146/51 160/65  Pulse: 72 64 64 68  Temp: 98.4 F (36.9 C) 98.3 F (36.8 C) 98.1 F (36.7 C) 97.7 F (36.5 C)  TempSrc: Oral Oral Oral Oral  Resp: 16 16 16 16   Weight:      SpO2: 97% 96% 98% 97%    Intake/Output Summary (Last 24 hours) at 09/17/15 1238 Last data filed at 09/17/15 0817  Gross per 24 hour  Intake    720 ml  Output      0 ml  Net    720 ml   Filed Weights   09/14/15 2127  Weight: 91.853 kg (202 lb 8 oz)    Examination:  General exam: Appears calm and comfortable  Respiratory system: Clear to auscultation. Respiratory effort normal. Cardiovascular system:  S1 & S2 heard, RRR. No JVD, murmurs, rubs, gallops or clicks. No pedal edema. Gastrointestinal system: Abdomen is nondistended, soft and nontender. No organomegaly or masses felt. Normal bowel sounds heard. Central nervous system: Alert and oriented. No focal neurological deficits. Extremities: Splint in  right lower extremity Skin: No rashes, lesions or ulcers Psychiatry: Judgement and insight appear normal. Mood & affect appropriate.    Data Reviewed: I have personally  reviewed following labs and imaging studies Basic Metabolic Panel:  Recent Labs Lab 09/14/15 1630 09/15/15 0330 09/17/15 0547  NA 131* 130* 135  K 3.2* 3.8 3.7  CL 93* 94* 98*  CO2 27 26 27   GLUCOSE 119* 151* 104*  BUN 31* 35* 24*  CREATININE 2.06* 2.01* 1.49*  CALCIUM 9.1 8.5* 8.8*   CBC:  Recent Labs Lab 09/14/15 1630 09/15/15 0330  WBC 12.8* 9.4  NEUTROABS 9.3*  --   HGB 10.6* 9.7*  HCT 31.5* 30.3*  MCV 86.8 87.1  PLT 284 277   Cardiac Enzymes: No results for input(s): CKTOTAL, CKMB, CKMBINDEX, TROPONINI in the last 168 hours. BNP (last 3 results)  Recent Labs  05/20/15 0610  BNP 1570.5*    ProBNP (last 3 results) No results for input(s): PROBNP in the last 8760 hours.  CBG:  Recent Labs Lab 2015-09-23 1547 Sep 23, 2015 2133 2015/09/23 2201 09/17/15 1032 09/17/15 1200  GLUCAP 127* 50* 98 268* 273*    No results found for this or any previous visit (from the past 240 hour(s)).   Studies: Dg Ankle Complete Left  September 23, 2015  CLINICAL DATA:  Pain following fall EXAM: LEFT ANKLE COMPLETE - 3+ VIEW COMPARISON:  None. FINDINGS: Frontal, oblique, and lateral views were obtained. There is a comminuted fracture of the distal fibular diaphysis with fracture fragments in overall near anatomic alignment. No other fractures are evident. No dislocation or appreciable joint effusion. The ankle mortise appears intact. There is no appreciable joint space narrowing. IMPRESSION: Comminuted fracture distal fibular diaphysis with fracture fragments in overall near anatomic alignment. Electronically Signed   By: Lowella Grip III M.D.   On: 09/23/2015 11:48   Dg Foot Complete Left  09/23/2015  CLINICAL DATA:  75 year old female with history of trauma from a fall several days ago complaining of left foot and ankle pain. EXAM: LEFT FOOT - COMPLETE 3+ VIEW COMPARISON:  No priors. FINDINGS: Three views of the left foot demonstrate no acute displaced fracture, subluxation or  dislocation in the foot. There is, however, a nondisplaced spiral type fracture of the distal third of the fibular diaphysis shortly above the lateral malleolus. IMPRESSION: 1. Nondisplaced fracture of the distal third of the fibular diaphysis. 2. No acute traumatic findings in the foot. Electronically Signed   By: Vinnie Langton M.D.   On: 09/23/15 11:46    Scheduled Meds: . atorvastatin  80 mg Oral QHS  . cefTRIAXone (ROCEPHIN)  IV  1 g Intravenous Q24H  . escitalopram  20 mg Oral Daily  . insulin aspart  0-15 Units Subcutaneous TID WC  . insulin aspart  0-5 Units Subcutaneous QHS  . insulin aspart protamine- aspart  14 Units Subcutaneous BID WC  . lisinopril  5 mg Oral Daily  . metoprolol succinate  50 mg Oral Daily  . pantoprazole  40 mg Oral Daily  . phenytoin (DILANTIN) IV  1,000 mg Intravenous Once  . phenytoin  300 mg Oral Daily  . saccharomyces boulardii  250 mg Oral BID   Continuous Infusions: . 0.9 % NaCl with  KCl 20 mEq / L 75 mL/hr at 09/14/15 2200    Time spent: 25 min    [facility] Pager [phone]. If 7PM-7AM, please contact night-coverage at [email or web address], Office  [phone]  password [email or web address] 09/17/2015, 12:38 PM  LOS: 3 days

## 2015-09-17 NOTE — Clinical Social Work Note (Signed)
Clinical Social Work Assessment  Patient Details  Name: DIANDREA TIMIAN MRN: [id number] Date of Birth: Nov 01, 1940  Date of referral:  09/17/15               Reason for consult:  Facility Placement, Discharge Planning                Permission sought to share information with:  Facility Sport and exercise psychologist, Family Supports Permission granted to share information::  Yes, Verbal Permission Granted  Name::     Danyetta Rizzuti  Agency::  SNF's  Relationship::  Spouse  Contact Information:  [phone]  Housing/Transportation Living arrangements for the past 2 months:  [place] of Information:  Medical Team, Spouse Patient Interpreter Needed:  None Criminal Activity/Legal Involvement Pertinent to Current Situation/Hospitalization:  No - Comment as needed Significant Relationships:  Spouse, Siblings Lives with:  Spouse Do you feel safe going back to the place where you live?  Yes Need for family participation in patient care:  Yes (Comment)  Care giving concerns:  Per PA, PT to change recommendation from HHPT to SNF now that patient and her husband are agreeable.   Social Worker assessment / plan:  CSW went to patient's room but she was asleep and did not arouse to her name being called. CSW called her husband. CSW introduced role and explained that discharge planning would be discussed. Patient's husband confirmed interest in SNF. First preference [facility] No further concerns. CSW encouraged patient's husband to contact CSW as needed. CSW will continue to follow patient and her husband for support and facilitate discharge to SNF once medically stable.  Employment status:  Retired Forensic scientist:  Medicare PT Recommendations:  Home with [place] (Changing recommendations from [place] now that patient and her husband agreeable to SNF.) Information / Referral to community resources:  [place]  Patient/Family's Response to care:  Patient  and husband agreeable to SNF placement. Patient's husband supportive and involved in patient's care. Patient's husband appreciated social work intervention.  Patient/Family's Understanding of and Emotional Response to Diagnosis, Current Treatment, and Prognosis:  Patient and her husband now understand need for rehab prior to returning home. Per PA, husband realizes that he cannot take care of her at home like this. Patient's husband appears happy with hospital care.  Emotional Assessment Appearance:  Appears stated age Attitude/Demeanor/Rapport:  Unable to Assess Affect (typically observed):  Unable to Assess Orientation:  Oriented to Self, Oriented to Place, Oriented to  Time, Oriented to Situation Alcohol / Substance use:  Never Used Psych involvement (Current and /or in the community):  No (Comment)  Discharge Needs  Concerns to be addressed:  Care Coordination Readmission within the last 30 days:  No Current discharge risk:  Dependent with Mobility Barriers to Discharge:  No Barriers Identified   Candie Chroman, LCSW 09/17/2015, 4:08 PM

## 2015-09-17 NOTE — Progress Notes (Signed)
Pt complaining of feeling like she's "vibrating". Assessed pt and legs appeared to spasm every couple of minutes. Otherwise pt's VSS and pt A/O. Paged Dr. Darrick Meigs who placed orders for oral magnesium oxide 400 mg daily, baclofen 5 mg q 8 hours, and a magnesium lab draw. Checked on pt 15 minutes later and she was sitting up in bed eating dinner and appeared more comfortable. Will continue to monitor

## 2015-09-17 NOTE — Progress Notes (Signed)
Inpatient Diabetes Program Recommendations  AACE/ADA: New Consensus Statement on Inpatient Glycemic Control (2015)  Target Ranges:  Prepandial:   less than 140 mg/dL      Peak postprandial:   less than 180 mg/dL (1-2 hours)      Critically ill patients:  140 - 180 mg/dL  Results for [name] [name], [name] [name] (MRN [id number]) as of 09/17/2015 09:57  Ref. Range 09/16/2015 06:15 09/16/2015 12:15 09/16/2015 15:47 09/16/2015 21:33 09/16/2015 22:01  Glucose-Capillary Latest Ref Range: 65-99 mg/dL 164 (H) 261 (H) 127 (H) 50 (L) 98   Review of Glycemic Control  Diabetes history: DM2 Outpatient Diabetes medications: 70/30 30 units QAM, 70/30 12 units QPM Current orders for Inpatient glycemic control: 70/30 14 units BID, Novolog 0-15 units TID with meals, Novolog 0-5 units QHS  Inpatient Diabetes Program Recommendations: Please consider decrease in Novolog correction to 0-9 units tid with meals.  Thank you, Nani Gasser. Cabrini Ruggieri, RN, MSN, CDE Inpatient Glycemic Control Team Team Pager [phone] (8am-5pm) 09/17/2015 9:59 AM

## 2015-09-17 NOTE — Progress Notes (Signed)
Orthopedic Tech Progress Note Patient Details:  [name] [name] 06-12-1940 [id number]  Ortho Devices Type of Ortho Device: CAM walker Ortho Device/Splint Location: lle Ortho Device/Splint Interventions: Application   Lonzo Saulter 09/17/2015, 9:43 AM

## 2015-09-17 NOTE — Progress Notes (Signed)
Patient ID: [name] [name], female   DOB: February 09, 1941, 75 y.o.   MRN: [id number]   I came up to see patient again.  Husband present.  I ordered cam boot for the Left LE and this was not put on yet.  Had PT bed to chair transfers without the boot on.  I did apply this. I had previously ordered boot to be on at all times.   RN had called me earlier and advised that patient had changed her mind about SNF placement.  Spoke with patient and husband again.  He voices concern about being able to help her due to NWB status.  Patient is agreeing to snf placement once medically stable.

## 2015-09-17 NOTE — Progress Notes (Signed)
Subjective: Doing ok.  Has left ankle pain.  Left ankle xrays from yesterday show a nondisplaced spiral fracture distal third fibula.     Objective: Vital signs in last 24 hours: Temp:  [97.7 F (36.5 C)-98.4 F (36.9 C)] 97.7 F (36.5 C) (07/18 0500) Pulse Rate:  [64-72] 68 (07/18 0500) Resp:  [16] 16 (07/18 0500) BP: (146-160)/(51-67) 160/65 mmHg (07/18 0500) SpO2:  [96 %-98 %] 97 % (07/18 0500)  Intake/Output from previous day: 07/17 0701 - 07/18 0700 In: 720 [P.O.:720] Out: -  Intake/Output this shift: Total I/O In: 240 [P.O.:240] Out: -    Recent Labs  09/14/15 1630 09/15/15 0330  HGB 10.6* 9.7*    Recent Labs  09/14/15 1630 09/15/15 0330  WBC 12.8* 9.4  RBC 3.63* 3.48*  HCT 31.5* 30.3*  PLT 284 277    Recent Labs  09/15/15 0330 09/17/15 0547  NA 130* 135  K 3.8 3.7  CL 94* 98*  CO2 26 27  BUN 35* 24*  CREATININE 2.01* 1.49*  GLUCOSE 151* 104*  CALCIUM 8.5* 8.8*    Recent Labs  09/15/15 0333 09/16/15 0308  INR 5.10* 4.75*    Exam:  Alert and oriented.  Splint right LE intact.  Left ankle tender and foot swollen which is unchanged from yesterday. NVI.  Skin warm and dry.   Assessment/Plan: -right bimalleolar ankle fracture.  Continue splint. -left nondisplaced distal fibula spiral fracture.  Not imaged in the ED.  Dr Lorin Mercy reviewed xray.  Will put in cam boot.  Must be NWB bilat LE's.   -spoke with patient and her husband and advised that SNF placement will likely be in her best interest when it is time to d/c from hospital. She will likely be NWB at least 4-6 weeks. Discussed [facility] and they are both agreeable when patient is medically stable.    Nieshia Larmon M 09/17/2015, 8:49 AM

## 2015-09-17 NOTE — Clinical Social Work Placement (Signed)
   CLINICAL SOCIAL WORK PLACEMENT  NOTE  Date:  09/17/2015  Patient Details  Name: [name] [name] MRN: [id number] Date of Birth: 1940-12-11  Clinical Social Work is seeking post-discharge placement for this patient at the [place] level of care (*CSW will initial, date and re-position this form in  chart as items are completed):  Yes   Patient/family provided with [place] Work Department's list of facilities offering this level of care within the geographic area requested by the patient (or if unable, by the patient's family).  Yes   Patient/family informed of their freedom to choose among providers that offer the needed level of care, that participate in Medicare, Medicaid or managed care program needed by the patient, have an available bed and are willing to accept the patient.  Yes   Patient/family informed of [place]'s ownership interest in [facility] and [facility], as well as of the fact that they are under no obligation to receive care at these facilities.  PASRR submitted to EDS on 09/17/15     PASRR number received on       Existing PASRR number confirmed on 09/17/15     FL2 transmitted to all facilities in geographic area requested by pt/family on 09/17/15     FL2 transmitted to all facilities within larger geographic area on       Patient informed that his/her managed care company has contracts with or will negotiate with certain facilities, including the following:            Patient/family informed of bed offers received.  Patient chooses bed at       Physician recommends and patient chooses bed at      Patient to be transferred to   on  .  Patient to be transferred to facility by       Patient family notified on   of transfer.  Name of family member notified:        PHYSICIAN Please sign FL2     Additional Comment:    _______________________________________________ Candie Chroman, LCSW 09/17/2015,  4:11 PM

## 2015-09-17 NOTE — Evaluation (Signed)
Physical Therapy Evaluation Patient Details Name: [name] [name] MRN: [id number] DOB: 12-16-1940 Today's Date: 09/17/2015   History of Present Illness  pt presents after a fall sustaining bil Fibular fxs.  pt with hx of L eye blindness, DM, HTN, A-fib, Seizures, CHF, R Femur fx, and shoulder surgery.    Clinical Impression  Pt moving very well and with MinA for transfer.  Feel pt will continue to make great progress to return to home with husband's support.  Pt indicates she is able to live on her ground level and would only have a single step to bump W/C up to enter that level.  Pt ed on need for continued work on transfers and safe mobility.  Will continue to follow.      Follow Up Recommendations Home health PT;Supervision - Intermittent    Equipment Recommendations  3in1 (PT);Wheelchair (measurements PT);Wheelchair cushion (measurements PT) (Drop arm 3-in-1 and W/C with elevating leg rests.)    Recommendations for Other Services       Precautions / Restrictions Precautions Precautions: Fall Required Braces or Orthoses: Other Brace/Splint Other Brace/Splint: Splint R LE and Cam boot L LE.   Restrictions Weight Bearing Restrictions: Yes RLE Weight Bearing: Non weight bearing LLE Weight Bearing: Non weight bearing      Mobility  Bed Mobility Overal bed mobility: Needs Assistance Bed Mobility: Supine to Sit     Supine to sit: Supervision     General bed mobility comments: pt able to long sit in bed without A.  Transfers Overall transfer level: Needs assistance Equipment used: None Transfers: Comptroller transfers: Min assist   General transfer comment: cueing for sequencing and technique.  pt only needed A to scoot hips to complete transition from bed to recliner.    Ambulation/Gait                Stairs            Wheelchair Mobility    Modified Rankin (Stroke Patients Only)       Balance Overall  balance assessment: Needs assistance Sitting-balance support: Single extremity supported;Bilateral upper extremity supported;Feet supported Sitting balance-Leahy Scale: Poor Sitting balance - Comments: Needs at least single UE support.                                       Pertinent Vitals/Pain Pain Assessment: 0-10 Pain Score: 3  Pain Location: Bil ankles. Pain Descriptors / Indicators: Aching Pain Intervention(s): Monitored during session;Premedicated before session;Repositioned    Home Living Family/patient expects to be discharged to:: Private residence Living Arrangements: Spouse/significant other Available Help at Discharge: Family;Available 24 hours/day Type of Home: House Home Access: Stairs to enter Entrance Stairs-Rails: None Entrance Stairs-Number of Steps: 1 Home Layout: Multi-level;Able to live on main level with bedroom/bathroom (Couch and bathroom) Home Equipment: Kasandra Knudsen - single point Additional Comments: pt plans to live on ground level with only a single step to enter and sleeping on couch with bathroom on that level too.      Prior Function Level of Independence: Independent with assistive device(s)               Hand Dominance        Extremity/Trunk Assessment   Upper Extremity Assessment: Defer to OT evaluation           Lower Extremity Assessment: RLE deficits/detail;LLE deficits/detail RLE Deficits /  Details: Sensation intact.  pt able to actively move LE, however limited by pain at thsi time.   LLE Deficits / Details: Sensation itnact.  pt able to actively move LE, but limited by pain at this time.    Cervical / Trunk Assessment: Normal  Communication   Communication: No difficulties  Cognition Arousal/Alertness: Awake/alert Behavior During Therapy: WFL for tasks assessed/performed Overall Cognitive Status: Within Functional Limits for tasks assessed                      General Comments      Exercises         Assessment/Plan    PT Assessment Patient needs continued PT services  PT Diagnosis Acute pain;Generalized weakness   PT Problem List Decreased strength;Decreased activity tolerance;Decreased balance;Decreased mobility;Decreased coordination;Decreased knowledge of use of DME;Pain  PT Treatment Interventions DME instruction;Functional mobility training;Therapeutic activities;Therapeutic exercise;Balance training;Patient/family education   PT Goals (Current goals can be found in the Care Plan section) Acute Rehab PT Goals Patient Stated Goal: Home ASAP PT Goal Formulation: With patient Time For Goal Achievement: 09/24/15 Potential to Achieve Goals: Good    Frequency Min 5X/week   Barriers to discharge        Co-evaluation               End of Session   Activity Tolerance: Patient tolerated treatment well Patient left: in chair;with call bell/phone within reach;with chair alarm set Nurse Communication: Mobility status         Time: [id number] PT Time Calculation (min) (ACUTE ONLY): 21 min   Charges:   PT Evaluation $PT Eval Moderate Complexity: 1 Procedure     PT G CodesCatarina Hartshorn, Aleighna [id number] 09/17/2015, 11:15 AM

## 2015-09-18 DIAGNOSIS — I4891 Unspecified atrial fibrillation: Secondary | ICD-10-CM

## 2015-09-18 DIAGNOSIS — S82841K Displaced bimalleolar fracture of right lower leg, subsequent encounter for closed fracture with nonunion: Secondary | ICD-10-CM

## 2015-09-18 LAB — COMPREHENSIVE METABOLIC PANEL
ALBUMIN: 2.4 g/dL — AB (ref 3.5–5.0)
ALK PHOS: 85 U/L (ref 38–126)
ALT: 14 U/L (ref 14–54)
ANION GAP: 11 (ref 5–15)
AST: 21 U/L (ref 15–41)
BUN: 21 mg/dL — ABNORMAL HIGH (ref 6–20)
CALCIUM: 8.5 mg/dL — AB (ref 8.9–10.3)
CO2: 25 mmol/L (ref 22–32)
Chloride: 99 mmol/L — ABNORMAL LOW (ref 101–111)
Creatinine, Ser: 1.29 mg/dL — ABNORMAL HIGH (ref 0.44–1.00)
GFR calc non Af Amer: 40 mL/min — ABNORMAL LOW (ref >60)
GFR, EST AFRICAN AMERICAN: 46 mL/min — AB (ref >60)
GLUCOSE: 188 mg/dL — AB (ref 65–99)
POTASSIUM: 4 mmol/L (ref 3.5–5.1)
Sodium: 135 mmol/L (ref 135–145)
Total Bilirubin: 0.4 mg/dL (ref 0.3–1.2)
Total Protein: 6.3 g/dL — ABNORMAL LOW (ref 6.5–8.1)

## 2015-09-18 LAB — GLUCOSE, CAPILLARY
GLUCOSE-CAPILLARY: 353 mg/dL — AB (ref 65–99)
Glucose-Capillary: 217 mg/dL — ABNORMAL HIGH (ref 65–99)

## 2015-09-18 LAB — URINE CULTURE: Culture: >=100000 — AB

## 2015-09-18 LAB — PHENYTOIN LEVEL, TOTAL: Phenytoin Lvl: 9.1 ug/mL — ABNORMAL LOW (ref 10.0–20.0)

## 2015-09-18 LAB — PROTIME-INR
INR: 2.95 — ABNORMAL HIGH (ref 0.00–1.49)
Prothrombin Time: 30.2 seconds — ABNORMAL HIGH (ref 11.6–15.2)

## 2015-09-18 MED ORDER — CEFUROXIME AXETIL 250 MG PO TABS: 250.0000 mg | ORAL_TABLET | Freq: Two times a day (BID) | ORAL | Status: DC

## 2015-09-18 MED ORDER — WARFARIN SODIUM 2.5 MG PO TABS: 1.2500 mg | ORAL_TABLET | ORAL | Status: DC

## 2015-09-18 MED ORDER — PHENYTOIN SODIUM EXTENDED 100 MG PO CAPS: 200.0000 mg | ORAL_CAPSULE | Freq: Two times a day (BID) | ORAL | Status: DC

## 2015-09-18 MED ORDER — PHENYTOIN SODIUM EXTENDED 200 MG PO CAPS: 200.0000 mg | ORAL_CAPSULE | Freq: Two times a day (BID) | ORAL | Status: DC

## 2015-09-18 MED ORDER — INSULIN NPH ISOPHANE & REGULAR (70-30) 100 UNIT/ML ~~LOC~~ SUSP: 14.0000 [IU] | Freq: Two times a day (BID) | SUBCUTANEOUS | Status: DC

## 2015-09-18 MED ORDER — CEFUROXIME AXETIL 250 MG PO TABS
250.0000 mg | ORAL_TABLET | Freq: Two times a day (BID) | ORAL | Status: DC
  Administered 2015-09-18: 250 mg via ORAL
  Filled 2015-09-18 (×2): qty 1

## 2015-09-18 MED ORDER — SODIUM CHLORIDE 0.9 % IV SOLN
500.0000 mg | Freq: Once | INTRAVENOUS | Status: DC
  Filled 2015-09-18: qty 10

## 2015-09-18 NOTE — Clinical Social Work Note (Signed)
CSW presented bed offer to patient and her husband. They accepted [facility]. Hoyle Sauer with [facility] will meet with them in the room in about 5-10 minutes to complete admission paperwork. Jolyne Loa, admissions coordinator, notified that patient will likely discharge today. Patient will require PTAR for transport.  [place] Scrape, [place]

## 2015-09-18 NOTE — Discharge Summary (Signed)
Physician Discharge Summary  [name] [name] [id number] DOB: 1941-01-14 DOA: 09/14/2015  PCP: [name], MD  Admit date: 09/14/2015 Discharge date: 09/18/2015  Admitted From:  Home Disposition:  SNF Recommendations for Outpatient Follow-up:  1. Follow up with PCP in 1-2 weeks 2. Please obtain CBC and INR on 09/20/15 and on 09/23/15 and adjust coumadin dose accordingly for INR 2-3 3. Recheck phenytoin level on 09/23/15 and adjust dose for level 10-20 4. Follow up with orthopedics, Dr. Lorin Mercy, in 1-2 weeks.  NWB x 6 weeks on bilateral legs 5. Follow up with neurology, Dr. Leonie Man, in 2-3 weeks    Discharge Condition:stable CODE STATUS:FULL Diet recommendation: Heart Healthy / Carb Modified   Brief/Interim Summary: 75 year old female with a history of diabetes mellitus, chronic systolic CHF, CAD, atrial fibrillation presented with occasional episodes of full body shaking with questionable loss of consciousness. Apparently, the patient was unable to get up after falling. EMS was activated. Imaging revealed bilateral fibular fractures. More specifically, the patient had a right lateral and posterior malleolus fracture and left fibular fracture in the distal third of the fibula. Orthopedics was consulted. They recommended nonoperative management and nonweightbearing for the next 6 weeks. The patient was placed in a splint on the right lower extremity and a CAM boot on the RLE. The patient's INR was noted to be elevated 5.18 at the time of admission. Her Coumadin was held. INR improved to 2.95. There was no bleeding complications. Her Coumadin was restarted at 1.25 mg every other day. The patient did not have any additional seizures. Her Dilantin level was subtherapeutic at time of admission. The patient was loaded with Dilantin on 2 separate occasions. The patient's maintenance Dilantin dose will be increased to 200 mg twice a day.  Discharge Diagnoses:   1. Bilateral fibular fracture- status  post application of splint on R, and CAM boot in left leg.orthopedics following. Morphine when necessary for pain.  D/C with  2. ? Seizure- patient had shaking of right hand and then passed out at home.  Pt with history of partial onset seizures with secondary generalization.  Dilantin level is sub therapeutic at time of admit. Was given oading dose of Dilantin 300 mg by mouth every 2 hours 2 doses, and then continue Dilantin 300 mg by mouth daily. 09/17/15 morning again dilantin level is 2.8-->gave 1 gram phenytoin . EEG is normal.  7/19 dilantin level 9.1. Given additional 500mg  IV phenytoin on 7/19 and d/c with phenytoin 200 mg po bid 3. UTI- EColi. Started ceftriaxone 1 g daily. D/C with cefuroxime 250 mg bid x 4 more days 4. Coronary artery disease- stable, continue metoprolol, Plavix. No chest pain 5. Chronic systolic CHF- EF [id number], Restart home dose Lasix. 6. chronic kidney disease, stage III- baseline creatinine 1.1-1.5.  Serum creatinine 1.29 on day of d/c. 7. Diabetes mellitus-increase NovoLog mix 70/30, 14 units twice a day, sliding scale insulin with NovoLog. 8. Hypertension- continue lisinopril, metoprolol 9. Atrial fibrillation- heart rate is controlled, continue metoprolol, Coumadin currently on hold due to elevated INR . INR 2.95 on day of d/c.  Due to interaction with phenytoin, and supratherapeutic INR at the time of admission, reduce dose of coumadin to 1.25 mg every 48 hours and recheck INR on 7/21 and 7/24  Discharge Instructions      Discharge Instructions    Diet - low sodium heart healthy    Complete by:  As directed      Increase activity slowly    Complete by:  As directed             Medication List    TAKE these medications        atorvastatin 40 MG tablet  Commonly known as:  LIPITOR  Take 2 tablets (80 mg total) by mouth at bedtime.     calcium-vitamin D 500-200 MG-UNIT tablet  Commonly known as:  OSCAL WITH D  Take 1 tablet by mouth daily.      cefUROXime 250 MG tablet  Commonly known as:  CEFTIN  Take 1 tablet (250 mg total) by mouth 2 (two) times daily with a meal.     CENTRUM SILVER ADULT 50+ PO  Take 1 tablet by mouth every morning. Reported on 08/07/2015     clopidogrel 75 MG tablet  Commonly known as:  PLAVIX  Take 1 tablet (75 mg total) by mouth daily.     escitalopram 10 MG tablet  Commonly known as:  LEXAPRO  TAKE TWO TABLETS BY MOUTH ONCE DAILY     furosemide 40 MG tablet  Commonly known as:  LASIX  Take 1 tablet (40 mg total) by mouth 2 (two) times daily.     insulin NPH-regular Human (70-30) 100 UNIT/ML injection  Commonly known as:  NOVOLIN 70/30  Inject 14 Units into the skin 2 (two) times daily with a meal. 30 Units in the morning; 12 units in the evening.     lisinopril 5 MG tablet  Commonly known as:  PRINIVIL,ZESTRIL  Take 1 tablet (5 mg total) by mouth daily.     LORazepam 0.5 MG tablet  Commonly known as:  ATIVAN  TAKE ONE TABLET BY MOUTH TWICE DAILY AS NEEDED FOR ANXIETY     metoprolol succinate 50 MG 24 hr tablet  Commonly known as:  TOPROL-XL  Take 1 tablet by mouth in the evening. Take with or immediately following a meal.     nitroGLYCERIN 0.4 MG SL tablet  Commonly known as:  NITROSTAT  Place 1 tablet (0.4 mg total) under the tongue every 5 (five) minutes as needed for chest pain.     pantoprazole 40 MG tablet  Commonly known as:  PROTONIX  Take 1 tablet (40 mg total) by mouth daily.     phenytoin 200 MG ER capsule  Commonly known as:  DILANTIN  Take 1 capsule (200 mg total) by mouth 2 (two) times daily.     promethazine 12.5 MG tablet  Commonly known as:  PHENERGAN  TAKE ONE TABLET BY MOUTH EVERY 6 HOURS AS NEEDED FOR NAUSEA AND VOMITING     saccharomyces boulardii 250 MG capsule  Commonly known as:  FLORASTOR  Take 1 capsule (250 mg total) by mouth 2 (two) times daily.     warfarin 2.5 MG tablet  Commonly known as:  COUMADIN  Take 0.5 tablets (1.25 mg total) by mouth every  other day.       Follow-up Information    Follow up with YATES,MARK C, MD In 1 week.   Specialty:  Orthopedic Surgery   Contact information:   [place] [place] [id number] [phone]       Follow up with [facility] .   Specialty:  Skilled Nursing Facility   Contact information:   [address] [place]      Follow up with [name], MD In 2 weeks.   Specialty:  Orthopedic Surgery   Contact information:   [address] [place] [address]  [phone]      Allergies  Allergen Reactions  . Procaine Hcl Anaphylaxis    Consultations:  Neurology-[place] on phone  [place] [facility]--Yates   Procedures/Studies: Dg Tibia/fibula Right  09/14/2015  CLINICAL DATA:  Right lower leg pain after fall today. EXAM: RIGHT TIBIA AND FIBULA - 2 VIEW COMPARISON:  Radiographs of Jul 13, 2012. FINDINGS: Possible minimally displaced fracture involving the posterior aspect of the distal tibia. Ankle radiographs are recommended for further evaluation. Also noted is probable minimally displaced fracture involving the distal right fibula. Vascular calcifications are noted in the soft tissues. IMPRESSION: Possible minimally displaced posterior malleolar fracture of distal tibia. Probable minimally displaced distal right fibular fracture. Ankle radiographs are recommended for further evaluation. Electronically Signed   By: Marijo Conception, M.D.   On: 09/14/2015 15:58   Dg Ankle Complete Left  09/16/2015  CLINICAL DATA:  Pain following fall EXAM: LEFT ANKLE COMPLETE - 3+ VIEW COMPARISON:  None. FINDINGS: Frontal, oblique, and lateral views were obtained. There is a comminuted fracture of the distal fibular diaphysis with fracture fragments in overall near anatomic alignment. No other fractures are evident. No dislocation or appreciable joint effusion. The ankle mortise appears intact. There is no appreciable joint space  narrowing. IMPRESSION: Comminuted fracture distal fibular diaphysis with fracture fragments in overall near anatomic alignment. Electronically Signed   By: Lowella Grip III M.D.   On: 09/16/2015 11:48   Dg Ankle Complete Right  09/14/2015  CLINICAL DATA:  Lateral right ankle pain and swelling EXAM: RIGHT ANKLE - COMPLETE 3+ VIEW COMPARISON:  None. FINDINGS: There is mild diffuse osteopenia. Lateral soft tissue swelling is identified. A nondisplaced fracture involving the distal fibula is suspected. No additional fractures identified. IMPRESSION: Suspect nondisplaced distal fibular fracture. The possible posterior malleolar fracture is not well visualized on this study. Given the treatment implications a CT of the right ankle would seem reasonable to completely assess the extent of fractures. Electronically Signed   By: Kerby Moors M.D.   On: 09/14/2015 17:05   Ct Ankle Right Wo Contrast  09/14/2015  CLINICAL DATA:  SMA 13-year-old female with fall and right ankle pain and fracture. EXAM: CT OF THE RIGHT ANKLE WITHOUT CONTRAST TECHNIQUE: Multidetector CT imaging of the right ankle was performed according to the standard protocol. Multiplanar CT image reconstructions were also generated. COMPARISON:  Right lower extremity radiograph dated 09/14/2015 FINDINGS: There is a nondisplaced oblique fracture of the distal fibular with extension into the lateral malleolus. There is a nondisplaced fracture of the posterior malleolus with involvement of the posterior articular surface of the tibial plafond. No other fracture identified. The bones are osteopenic. There is no dislocation. The ankle mortise intact. There is soft swelling around the ankle primarily over the lateral malleolus. No fluid collection no large hematoma. IMPRESSION: Nondisplaced oblique fracture of the distal fibula and lateral malleolus as well as nondisplaced fracture of the posterior malleolus. No dislocation. Electronically Signed   By:  Anner Crete M.D.   On: 09/14/2015 18:27   Dg Foot Complete Left  09/16/2015  CLINICAL DATA:  75 year old female with history of trauma from a fall several days ago complaining of left foot and ankle pain. EXAM: LEFT FOOT - COMPLETE 3+ VIEW COMPARISON:  No priors. FINDINGS: Three views of the left foot demonstrate no acute displaced fracture, subluxation or dislocation in the foot. There is, however, a nondisplaced spiral type fracture of the distal third of the fibular diaphysis shortly above the lateral malleolus. IMPRESSION:  1. Nondisplaced fracture of the distal third of the fibular diaphysis. 2. No acute traumatic findings in the foot. Electronically Signed   By: Vinnie Langton M.D.   On: 09/16/2015 11:46        Discharge Exam: Filed Vitals:   09/17/15 2007 09/18/15 0525  BP: 135/47 158/54  Pulse: 62 66  Temp: 97.7 F (36.5 C) 98.2 F (36.8 C)  Resp: 16 14   Filed Vitals:   09/17/15 0500 09/17/15 1317 09/17/15 2007 09/18/15 0525  BP: 160/65 149/55 135/47 158/54  Pulse: 68 70 62 66  Temp: 97.7 F (36.5 C) 98 F (36.7 C) 97.7 F (36.5 C) 98.2 F (36.8 C)  TempSrc: Oral Oral Oral Oral  Resp: 16 18 16 14   Weight:      SpO2: 97% 99% 98% 97%    General: Pt is alert, awake, not in acute distress Cardiovascular: RRR, S1/S2 +, no rubs, no gallops Respiratory: CTA bilaterally, no wheezing, no rhonchi Abdominal: Soft, NT, ND, bowel sounds + Extremities: no edema, no cyanosis   The results of significant diagnostics from this hospitalization (including imaging, microbiology, ancillary and laboratory) are listed below for reference.    Significant Diagnostic Studies: Dg Tibia/fibula Right  09/14/2015  CLINICAL DATA:  Right lower leg pain after fall today. EXAM: RIGHT TIBIA AND FIBULA - 2 VIEW COMPARISON:  Radiographs of Jul 13, 2012. FINDINGS: Possible minimally displaced fracture involving the posterior aspect of the distal tibia. Ankle radiographs are recommended for  further evaluation. Also noted is probable minimally displaced fracture involving the distal right fibula. Vascular calcifications are noted in the soft tissues. IMPRESSION: Possible minimally displaced posterior malleolar fracture of distal tibia. Probable minimally displaced distal right fibular fracture. Ankle radiographs are recommended for further evaluation. Electronically Signed   By: Marijo Conception, M.D.   On: 09/14/2015 15:58   Dg Ankle Complete Left  09/16/2015  CLINICAL DATA:  Pain following fall EXAM: LEFT ANKLE COMPLETE - 3+ VIEW COMPARISON:  None. FINDINGS: Frontal, oblique, and lateral views were obtained. There is a comminuted fracture of the distal fibular diaphysis with fracture fragments in overall near anatomic alignment. No other fractures are evident. No dislocation or appreciable joint effusion. The ankle mortise appears intact. There is no appreciable joint space narrowing. IMPRESSION: Comminuted fracture distal fibular diaphysis with fracture fragments in overall near anatomic alignment. Electronically Signed   By: Lowella Grip III M.D.   On: 09/16/2015 11:48   Dg Ankle Complete Right  09/14/2015  CLINICAL DATA:  Lateral right ankle pain and swelling EXAM: RIGHT ANKLE - COMPLETE 3+ VIEW COMPARISON:  None. FINDINGS: There is mild diffuse osteopenia. Lateral soft tissue swelling is identified. A nondisplaced fracture involving the distal fibula is suspected. No additional fractures identified. IMPRESSION: Suspect nondisplaced distal fibular fracture. The possible posterior malleolar fracture is not well visualized on this study. Given the treatment implications a CT of the right ankle would seem reasonable to completely assess the extent of fractures. Electronically Signed   By: Kerby Moors M.D.   On: 09/14/2015 17:05   Ct Ankle Right Wo Contrast  09/14/2015  CLINICAL DATA:  SMA 26-year-old female with fall and right ankle pain and fracture. EXAM: CT OF THE RIGHT ANKLE WITHOUT  CONTRAST TECHNIQUE: Multidetector CT imaging of the right ankle was performed according to the standard protocol. Multiplanar CT image reconstructions were also generated. COMPARISON:  Right lower extremity radiograph dated 09/14/2015 FINDINGS: There is a nondisplaced oblique fracture of the distal fibular with  extension into the lateral malleolus. There is a nondisplaced fracture of the posterior malleolus with involvement of the posterior articular surface of the tibial plafond. No other fracture identified. The bones are osteopenic. There is no dislocation. The ankle mortise intact. There is soft swelling around the ankle primarily over the lateral malleolus. No fluid collection no large hematoma. IMPRESSION: Nondisplaced oblique fracture of the distal fibula and lateral malleolus as well as nondisplaced fracture of the posterior malleolus. No dislocation. Electronically Signed   By: Anner Crete M.D.   On: 09/14/2015 18:27   Dg Foot Complete Left  09/16/2015  CLINICAL DATA:  75 year old female with history of trauma from a fall several days ago complaining of left foot and ankle pain. EXAM: LEFT FOOT - COMPLETE 3+ VIEW COMPARISON:  No priors. FINDINGS: Three views of the left foot demonstrate no acute displaced fracture, subluxation or dislocation in the foot. There is, however, a nondisplaced spiral type fracture of the distal third of the fibular diaphysis shortly above the lateral malleolus. IMPRESSION: 1. Nondisplaced fracture of the distal third of the fibular diaphysis. 2. No acute traumatic findings in the foot. Electronically Signed   By: Vinnie Langton M.D.   On: 09/16/2015 11:46     Microbiology: Recent Results (from the past 240 hour(s))  Urine culture     Status: Abnormal   Collection Time: 09/16/15  6:52 PM  Result Value Ref Range Status   Specimen Description URINE, CLEAN CATCH  Final   Special Requests NONE  Final   Culture >=100,000 COLONIES/mL ESCHERICHIA COLI (A)  Final    Report Status 09/18/2015 FINAL  Final   Organism ID, Bacteria ESCHERICHIA COLI (A)  Final      Susceptibility   Escherichia coli - MIC*    AMPICILLIN >=32 RESISTANT Resistant     CEFAZOLIN <=4 SENSITIVE Sensitive     CEFTRIAXONE <=1 SENSITIVE Sensitive     CIPROFLOXACIN <=0.25 SENSITIVE Sensitive     GENTAMICIN <=1 SENSITIVE Sensitive     IMIPENEM <=0.25 SENSITIVE Sensitive     NITROFURANTOIN <=16 SENSITIVE Sensitive     TRIMETH/SULFA >=320 RESISTANT Resistant     AMPICILLIN/SULBACTAM 16 INTERMEDIATE Intermediate     PIP/TAZO <=4 SENSITIVE Sensitive     * >=100,000 COLONIES/mL ESCHERICHIA COLI     Labs: Basic Metabolic Panel:  Recent Labs Lab 09/14/15 1630 09/15/15 0330 09/17/15 0547 09/17/15 1837 09/18/15 0447  NA 131* 130* 135  --  135  K 3.2* 3.8 3.7  --  4.0  CL 93* 94* 98*  --  99*  CO2 27 26 27   --  25  GLUCOSE 119* 151* 104*  --  188*  BUN 31* 35* 24*  --  21*  CREATININE 2.06* 2.01* 1.49*  --  1.29*  CALCIUM 9.1 8.5* 8.8*  --  8.5*  MG  --   --   --  1.7  --    Liver Function Tests:  Recent Labs Lab 09/18/15 0447  AST 21  ALT 14  ALKPHOS 85  BILITOT 0.4  PROT 6.3*  ALBUMIN 2.4*   No results for input(s): LIPASE, AMYLASE in the last 168 hours. No results for input(s): AMMONIA in the last 168 hours. CBC:  Recent Labs Lab 09/14/15 1630 09/15/15 0330  WBC 12.8* 9.4  NEUTROABS 9.3*  --   HGB 10.6* 9.7*  HCT 31.5* 30.3*  MCV 86.8 87.1  PLT 284 277   Cardiac Enzymes: No results for input(s): CKTOTAL, CKMB, CKMBINDEX, TROPONINI in the  last 168 hours. BNP: Invalid input(s): POCBNP CBG:  Recent Labs Lab 09/17/15 1200 09/17/15 1608 09/17/15 2126 09/18/15 0702 09/18/15 1146  GLUCAP 273* 140* 88 217* 353*    Time coordinating discharge:  Greater than 30 minutes  Signed:  Darion Juhasz, DO [facility] Pager: [phone] 09/18/2015, 12:15 PM

## 2015-09-18 NOTE — Progress Notes (Addendum)
[name] [name] to be D/C'd Skilled nursing facility per MD order.  Discussed with the patient and all questions fully answered.  VSS, Skin clean, dry and intact without evidence of skin break down, no evidence of skin tears noted. IV catheter discontinued intact. Site without signs and symptoms of complications. Dressing and pressure applied.  An After Visit Summary was printed and given to the patient. Patient received prescription.  D/c education completed with patient/family including follow up instructions, medication list, d/c activities limitations if indicated, with other d/c instructions as indicated by MD - patient able to verbalize understanding, all questions fully answered.   Patient instructed to return to ED, call 911, or call MD for any changes in condition.   Patient escorted via [facility] to [place]. Full report given to facility.  Jerry Caras 09/18/2015 1:50 PM

## 2015-09-18 NOTE — Care Management Important Message (Signed)
Important Message  Patient Details  Name: [name] [name] MRN: [id number] Date of Birth: Dec 31, 1940   Medicare Important Message Given:  Yes    Loann Quill 09/18/2015, 10:08 AM

## 2015-09-18 NOTE — Clinical Social Work Note (Signed)
CSW facilitated patient discharge including contacting patient family and facility to confirm patient discharge plans. Clinical information faxed to facility and family agreeable with plan. CSW arranged ambulance transport via [facility] to [facility]. RN to call report prior to discharge ([phone]).  CSW will sign off for now as social work intervention is no longer needed. Please consult us again if new needs arise.  Jo Cerone, CSW [phone]   

## 2015-09-18 NOTE — Progress Notes (Signed)
Patient ID: [name] [name], female   DOB: [birth year], 75 y.o.   MRN: [id number] Patient will be NWB. She can see me in office in 1 -2 wks for right [place] application.  She would not heal any quicker with surgery. Should be able to WB after 6 wks.

## 2015-09-18 NOTE — Clinical Social Work Placement (Signed)
   CLINICAL SOCIAL WORK PLACEMENT  NOTE  Date:  09/18/2015  Patient Details  Name: [name] [name] MRN: [id number] Date of Birth: Aug 03, 1940  Clinical Social Work is seeking post-discharge placement for this patient at the [place] level of care (*CSW will initial, date and re-position this form in  chart as items are completed):  Yes   Patient/family provided with [facility] Work Department's list of facilities offering this level of care within the geographic area requested by the patient (or if unable, by the patient's family).  Yes   Patient/family informed of their freedom to choose among providers that offer the needed level of care, that participate in Medicare, Medicaid or managed care program needed by the patient, have an available bed and are willing to accept the patient.  Yes   Patient/family informed of [facility]'s ownership interest in [facility] and [facility], as well as of the fact that they are under no obligation to receive care at these facilities.  PASRR submitted to EDS on 09/17/15     PASRR number received on       Existing PASRR number confirmed on 09/17/15     FL2 transmitted to all facilities in geographic area requested by pt/family on 09/17/15     FL2 transmitted to all facilities within larger geographic area on       Patient informed that his/her managed care company has contracts with or will negotiate with certain facilities, including the following:        Yes   Patient/family informed of bed offers received.  Patient chooses bed at [facility]     Physician recommends and patient chooses bed at      Patient to be transferred to [facility] on 09/18/15.  Patient to be transferred to facility by [facility]     Patient family notified on 09/18/15 of transfer.  Name of family member notified:  [name]     PHYSICIAN Please sign FL2, Please prepare prescriptions     Additional Comment:     _______________________________________________ Candie Chroman, LCSW 09/18/2015, 12:26 PM

## 2015-09-19 ENCOUNTER — Non-Acute Institutional Stay (SKILLED_NURSING_FACILITY): Payer: Medicare Other | Admitting: Adult Health

## 2015-09-19 ENCOUNTER — Encounter: Payer: Self-pay | Admitting: Adult Health

## 2015-09-19 DIAGNOSIS — I5022 Chronic systolic (congestive) heart failure: Secondary | ICD-10-CM | POA: Diagnosis not present

## 2015-09-19 DIAGNOSIS — I480 Paroxysmal atrial fibrillation: Secondary | ICD-10-CM | POA: Diagnosis not present

## 2015-09-19 DIAGNOSIS — G40909 Epilepsy, unspecified, not intractable, without status epilepticus: Secondary | ICD-10-CM

## 2015-09-19 DIAGNOSIS — E1122 Type 2 diabetes mellitus with diabetic chronic kidney disease: Secondary | ICD-10-CM | POA: Diagnosis not present

## 2015-09-19 DIAGNOSIS — I2510 Atherosclerotic heart disease of native coronary artery without angina pectoris: Secondary | ICD-10-CM

## 2015-09-19 DIAGNOSIS — S82402S Unspecified fracture of shaft of left fibula, sequela: Secondary | ICD-10-CM

## 2015-09-19 DIAGNOSIS — E785 Hyperlipidemia, unspecified: Secondary | ICD-10-CM

## 2015-09-19 DIAGNOSIS — N183 Chronic kidney disease, stage 3 unspecified: Secondary | ICD-10-CM

## 2015-09-19 DIAGNOSIS — R5381 Other malaise: Secondary | ICD-10-CM | POA: Diagnosis not present

## 2015-09-19 DIAGNOSIS — S82401S Unspecified fracture of shaft of right fibula, sequela: Secondary | ICD-10-CM | POA: Diagnosis not present

## 2015-09-19 DIAGNOSIS — I2583 Coronary atherosclerosis due to lipid rich plaque: Secondary | ICD-10-CM

## 2015-09-19 DIAGNOSIS — F329 Major depressive disorder, single episode, unspecified: Secondary | ICD-10-CM

## 2015-09-19 DIAGNOSIS — I10 Essential (primary) hypertension: Secondary | ICD-10-CM

## 2015-09-19 DIAGNOSIS — K219 Gastro-esophageal reflux disease without esophagitis: Secondary | ICD-10-CM

## 2015-09-19 DIAGNOSIS — N390 Urinary tract infection, site not specified: Secondary | ICD-10-CM | POA: Diagnosis not present

## 2015-09-19 DIAGNOSIS — F419 Anxiety disorder, unspecified: Secondary | ICD-10-CM

## 2015-09-19 DIAGNOSIS — F32A Depression, unspecified: Secondary | ICD-10-CM

## 2015-09-19 DIAGNOSIS — Z794 Long term (current) use of insulin: Secondary | ICD-10-CM

## 2015-09-19 NOTE — Progress Notes (Signed)
Patient ID: [name] [name], female   DOB: 07/09/1940, 75 y.o.   MRN: [id number]    DATE:  09/19/2015   MRN:  [id number]  BIRTHDAY: 1940/05/02  Facility:  [facility]ursing Home Location:  [place] Room Number: 705-P  LEVEL OF CARE:  SNF (31)  Contact Information    Name Relation Home Work Mobile   [place] Spouse [phone]  [phone]   Smith,Margaret Sister [phone]  [phone]       Code Status History    Date Active Date Inactive Code Status Order ID Comments User Context   09/14/2015  9:09 PM 09/18/2015  5:33 PM Full Code [id number]  Quintella Baton, MD Inpatient   05/17/2015 12:53 AM 05/21/2015  6:19 PM Full Code [id number]  Rise Patience, MD Inpatient   05/07/2015 12:39 AM 05/11/2015  8:37 PM Full Code [id number]  Rise Patience, MD ED   01/22/2014 10:45 PM 01/24/2014  6:39 PM Full Code [id number]  Etta Quill, DO Inpatient   11/18/2012  6:59 PM 11/21/2012  7:20 PM Full Code [id number]  Verlee Monte, MD Inpatient   03/03/2012  1:02 PM 03/04/2012  6:58 PM Full Code [id number]  Louellen Molder, MD Inpatient   05/22/2011  2:03 PM 06/02/2011  7:27 PM Full Code [id number]  Karen Kays, NP ED   05/16/2011  8:54 PM 05/19/2011  7:11 PM Full Code [id number]  Theressa Millard, MD Inpatient    Advance Directive Documentation        Most Recent Value   Type of Advance Directive  Out of facility DNR (pink MOST or yellow form)   Pre-existing out of facility DNR order (yellow form or pink MOST form)     "MOST" Form in Place?         Chief Complaint  Patient presents with  . Hospitalization Follow-up    HISTORY OF PRESENT ILLNESS:  [name] who has been admitted to [facility] on 09/18/15 from [facility]. She has PMH of diabetes mellitus, chronic systolic CHF, CAD and atrial fibrillation. She was having occasional episodes of full body shaking with questionable loss of consciousness. She had a fall and sustained  bilateral fibular fractures:  right lateral and posterior malleolus fracture and left fibular fracture in the distal third of the fibula. Orthopedics was consulted and recommended nonoperative management and nonweightbearing for the next 6 weeks. She was placed in a splint on the RLE and CAM boot on LLE. She was loaded with Dilantin 13 on 2 separate occasions while in the hospital and maintenance Dilantin dose was increased to 200 mg twice a day.  She has been admitted for a short-term rehabilitation.   PAST MEDICAL HISTORY:  Past Medical History  Diagnosis Date  . Blindness of left eye   . Eye muscle weakness     Right eye weakness after cataract surgery  . Common migraine     ?hx of  . DIABETES MELLITUS, TYPE II 03/27/2006    dr [name]  . HYPERLIPIDEMIA 03/27/2006  . 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 HYPERTENSION 03/27/2006  . Atrial fibrillation ([place])     SVT dx 2007, cath 2007 mild CAD, then had an cardioversion, ablation; still on coumadin , has occ palpitation, EKG 03-2010 NSR  . Headache(784.0) 05/07/2009  . LUNG NODULE 09/01/2006    Excision, Bx [place]  . Osteopenia 2004    Dexa 2004 showed Osteopenia, DEXA 03/2007 normal  . Seizures ([place])   .  Intertrochanteric fracture of right hip ([place]) 07/13/2012  . 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 GERD (gastroesophageal reflux disease) 10/05/2011  . Recurrent urinary tract infection     Seeing Urology  . 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[place]) [id number]  . Other chronic cystitis with hematuria   . Osteopenia      CURRENT MEDICATIONS: Reviewed  Patient's Medications  New Prescriptions   No medications on file  Previous Medications   ACETAMINOPHEN (TYLENOL) 325 MG TABLET    Take 650 mg by mouth every 6 (six) hours as needed for mild pain or moderate pain.   ATORVASTATIN (LIPITOR) 80 MG TABLET    Take 80 mg by mouth daily.   CALCIUM-VITAMIN D (OSCAL WITH D) 500-200 MG-UNIT PER TABLET    Take 1 tablet by mouth daily.   CEFUROXIME (CEFTIN) 250 MG TABLET     Take 1 tablet (250 mg total) by mouth 2 (two) times daily with a meal.   CLOPIDOGREL (PLAVIX) 75 MG TABLET    Take 1 tablet (75 mg total) by mouth daily.   ESCITALOPRAM (LEXAPRO) 10 MG TABLET    TAKE TWO TABLETS BY MOUTH ONCE DAILY   FUROSEMIDE (LASIX) 40 MG TABLET    Take 1 tablet (40 mg total) by mouth 2 (two) times daily.   INSULIN NPH-REGULAR HUMAN (NOVOLIN 70/30) (70-30) 100 UNIT/ML INJECTION    Inject 14 Units into the skin 2 (two) times daily with a meal. 30 Units in the morning; 12 units in the evening.   LISINOPRIL (PRINIVIL,ZESTRIL) 5 MG TABLET    Take 1 tablet (5 mg total) by mouth daily.   LORAZEPAM (ATIVAN) 0.5 MG TABLET    TAKE ONE TABLET BY MOUTH TWICE DAILY AS NEEDED FOR ANXIETY   METOPROLOL SUCCINATE (TOPROL-XL) 50 MG 24 HR TABLET    Take 1 tablet by mouth in the evening. Take with or immediately following a meal.   MULTIPLE VITAMINS-MINERALS (CENTRUM SILVER ADULT 50+ PO)    Take 1 tablet by mouth every morning. Reported on 08/07/2015   NITROGLYCERIN (NITROSTAT) 0.4 MG SL TABLET    Place 1 tablet (0.4 mg total) under the tongue every 5 (five) minutes as needed for chest pain.   PANTOPRAZOLE (PROTONIX) 40 MG TABLET    Take 1 tablet (40 mg total) by mouth daily.   PHENYTOIN (DILANTIN) 200 MG ER CAPSULE    Take 1 capsule (200 mg total) by mouth 2 (two) times daily.   PROMETHAZINE (PHENERGAN) 12.5 MG TABLET    TAKE ONE TABLET BY MOUTH EVERY 6 HOURS AS NEEDED FOR NAUSEA AND VOMITING   SACCHAROMYCES BOULARDII (FLORASTOR) 250 MG CAPSULE    Take 1 capsule (250 mg total) by mouth 2 (two) times daily.   WARFARIN (COUMADIN) 2.5 MG TABLET    Take 0.5 tablets (1.25 mg total) by mouth every other day.  Modified Medications   No medications on file  Discontinued Medications   ATORVASTATIN (LIPITOR) 40 MG TABLET    Take 2 tablets (80 mg total) by mouth at bedtime.     Allergies  Allergen Reactions  . Procaine Hcl Anaphylaxis     REVIEW OF SYSTEMS:  GENERAL: no change in appetite, no  fatigue, no weight changes, no fever, chills or weakness EYES: Denies change in vision, dry eyes, eye pain, itching or discharge EARS: Denies change in hearing, ringing in ears, or earache NOSE: Denies nasal congestion or epistaxis MOUTH and THROAT: Denies oral discomfort, gingival pain or bleeding, pain from teeth or hoarseness   RESPIRATORY: no  cough, SOB, DOE, wheezing, hemoptysis CARDIAC: no chest pain, edema or palpitations GI: no abdominal pain, diarrhea, constipation, heart burn, nausea or vomiting GU: Denies dysuria, frequency, hematuria, incontinence, or discharge PSYCHIATRIC: Denies feeling of depression or anxiety. No report of hallucinations, insomnia, paranoia, or agitation   PHYSICAL EXAMINATION  GENERAL APPEARANCE: Well nourished. In no acute distress. Normal body habitus SKIN:  Skin is warm and dry.  HEAD: Normal in size and contour. No evidence of trauma EYES: Lids open and close normally. No blepharitis, entropion or ectropion. PERRL. Conjunctivae are clear and sclerae are white. Lenses are without opacity EARS: Pinnae are normal. Patient hears normal voice tunes of the examiner MOUTH and THROAT: Lips are without lesions. Oral mucosa is moist and without lesions. Tongue is normal in shape, size, and color and without lesions NECK: supple, trachea midline, no neck masses, no thyroid tenderness, no thyromegaly LYMPHATICS: no LAN in the neck, no supraclavicular LAN RESPIRATORY: breathing is even & unlabored, BS CTAB CARDIAC: RRR, no murmur,no extra heart sounds, no edema GI: abdomen soft, normal BS, no masses, no tenderness, no hepatomegaly, no splenomegaly EXTREMITIES:  Able to move X 4 extremities; RLE with splint and ACE wrap, LLE with camboot PSYCHIATRIC: Alert and oriented X 3. Affect and behavior are appropriate  LABS/RADIOLOGY: Labs reviewed: Basic Metabolic Panel:  Recent Labs  05/11/15 0535  09/15/15 0330 09/17/15 0547 09/17/15 1837 09/18/15 0447  NA 140   < > 130* 135  --  135  K 3.4*  < > 3.8 3.7  --  4.0  CL 103  < > 94* 98*  --  99*  CO2 23  < > 26 27  --  25  GLUCOSE 152*  < > 151* 104*  --  188*  BUN 18  < > 35* 24*  --  21*  CREATININE 1.28*  < > 2.01* 1.49*  --  1.29*  CALCIUM 8.7*  < > 8.5* 8.8*  --  8.5*  MG  --   --   --   --  1.7  --   PHOS 2.3*  --   --   --   --   --   < > = values in this interval not displayed. Liver Function Tests:  Recent Labs  05/20/15 0110 08/01/15 1512 09/18/15 0447  AST 39 13 21  ALT 18 7 14   ALKPHOS 112 134* 85  BILITOT 0.5 0.4 0.4  PROT 6.7 7.1 6.3*  ALBUMIN 2.6* 3.4* 2.4*    Recent Labs  05/07/15 0349 05/20/15 0110  LIPASE 15 15   CBC:  Recent Labs  05/29/15 1215 08/01/15 1512 09/14/15 1630 09/15/15 0330  WBC 7.0 10.6 12.8* 9.4  NEUTROABS 4.7 6784 9.3*  --   HGB 10.1* 10.8* 10.6* 9.7*  HCT 30.8* 33.0* 31.5* 30.3*  MCV 91.3 89.2 86.8 87.1  PLT 387.0 444* 284 277   Lipid Panel:  Recent Labs  08/29/15 1215  HDL 53.60   Cardiac Enzymes:  Recent Labs  05/20/15 0110 05/20/15 0610 05/20/15 1250  TROPONINI 15.66* 13.12* 10.69*   CBG:  Recent Labs  09/17/15 2126 09/18/15 0702 09/18/15 1146  GLUCAP 88 217* 353*      Dg Tibia/fibula Right  09/14/2015  CLINICAL DATA:  Right lower leg pain after fall today. EXAM: RIGHT TIBIA AND FIBULA - 2 VIEW COMPARISON:  Radiographs of Jul 13, 2012. FINDINGS: Possible minimally displaced fracture involving the posterior aspect of the distal tibia. Ankle radiographs are recommended for further evaluation.  Also noted is probable minimally displaced fracture involving the distal right fibula. Vascular calcifications are noted in the soft tissues. IMPRESSION: Possible minimally displaced posterior malleolar fracture of distal tibia. Probable minimally displaced distal right fibular fracture. Ankle radiographs are recommended for further evaluation. Electronically Signed   By: Marijo Conception, M.D.   On: 09/14/2015 15:58   Dg  Ankle Complete Left  09/16/2015  CLINICAL DATA:  Pain following fall EXAM: LEFT ANKLE COMPLETE - 3+ VIEW COMPARISON:  None. FINDINGS: Frontal, oblique, and lateral views were obtained. There is a comminuted fracture of the distal fibular diaphysis with fracture fragments in overall near anatomic alignment. No other fractures are evident. No dislocation or appreciable joint effusion. The ankle mortise appears intact. There is no appreciable joint space narrowing. IMPRESSION: Comminuted fracture distal fibular diaphysis with fracture fragments in overall near anatomic alignment. Electronically Signed   By: Lowella Grip III M.D.   On: 09/16/2015 11:48   Dg Ankle Complete Right  09/14/2015  CLINICAL DATA:  Lateral right ankle pain and swelling EXAM: RIGHT ANKLE - COMPLETE 3+ VIEW COMPARISON:  None. FINDINGS: There is mild diffuse osteopenia. Lateral soft tissue swelling is identified. A nondisplaced fracture involving the distal fibula is suspected. No additional fractures identified. IMPRESSION: Suspect nondisplaced distal fibular fracture. The possible posterior malleolar fracture is not well visualized on this study. Given the treatment implications a CT of the right ankle would seem reasonable to completely assess the extent of fractures. Electronically Signed   By: Kerby Moors M.D.   On: 09/14/2015 17:05   Ct Ankle Right Wo Contrast  09/14/2015  CLINICAL DATA:  SMA 88-year-old female with fall and right ankle pain and fracture. EXAM: CT OF THE RIGHT ANKLE WITHOUT CONTRAST TECHNIQUE: Multidetector CT imaging of the right ankle was performed according to the standard protocol. Multiplanar CT image reconstructions were also generated. COMPARISON:  Right lower extremity radiograph dated 09/14/2015 FINDINGS: There is a nondisplaced oblique fracture of the distal fibular with extension into the lateral malleolus. There is a nondisplaced fracture of the posterior malleolus with involvement of the posterior  articular surface of the tibial plafond. No other fracture identified. The bones are osteopenic. There is no dislocation. The ankle mortise intact. There is soft swelling around the ankle primarily over the lateral malleolus. No fluid collection no large hematoma. IMPRESSION: Nondisplaced oblique fracture of the distal fibula and lateral malleolus as well as nondisplaced fracture of the posterior malleolus. No dislocation. Electronically Signed   By: Anner Crete M.D.   On: 09/14/2015 18:27   Dg Foot Complete Left  09/16/2015  CLINICAL DATA:  75 year old female with history of trauma from a fall several days ago complaining of left foot and ankle pain. EXAM: LEFT FOOT - COMPLETE 3+ VIEW COMPARISON:  No priors. FINDINGS: Three views of the left foot demonstrate no acute displaced fracture, subluxation or dislocation in the foot. There is, however, a nondisplaced spiral type fracture of the distal third of the fibular diaphysis shortly above the lateral malleolus. IMPRESSION: 1. Nondisplaced fracture of the distal third of the fibular diaphysis. 2. No acute traumatic findings in the foot. Electronically Signed   By: Vinnie Langton M.D.   On: 09/16/2015 11:46    ASSESSMENT/PLAN:  Physical deconditioning - for rehabilitation, PT and OT  Bilateral fibular fracture - orthopedics was consulted and recommended nonoperative management, S/P application of splint on RLE and CAM boot on LLE; follow-up with orthopedics in 1-2 weeks; continue acetaminophen 325 mg take  2 tabs = 650 mg by mouth every 6 hours when necessary for pain; NWB on BLE X 6 weeks  Seizure disorder - she was given 2 loads of Dilantin while in the hospital; EEG is normal; continue phenytoin 200 mg by mouth twice a day; phenytoin level on 09/23/15; follow-up with Dr. Leonie Man, neurology, in 2-3 weeks  UTI - started on ceftriaxone 1 g daily then discharged on cefuroxime 250 mg twice a day 4 more days  Coronary artery disease - stable; continue  Plavix 75 mg 1 tab by mouth daily, NTG PRN and metoprolol succinate ER 50 mg 1 tab by mouth every evening  Chronic systolic CHF - EF [id number]; continue Lasix 40 mg 1 tab by mouth twice a day; check BMP on 09/23/15  Chronic kidney disease, stage III - Baseline creatinine 1.1-1.5; will monitor Lab Results  Component Value Date   CREATININE 1.29* 09/18/2015   Diabetes mellitus, type II - continue Humulin 70-30   30 units every morning, 14 units at lunch and dinner  And 12 units Q HS  Lab Results  Component Value Date   HGBA1C 6.3 06/17/2015   Hypertension - continue lisinopril 5 mg 1 tab by mouth daily and metoprolol succinate ER 50 mg 1 tab by mouth daily evening   Atrial fibrillation - rate controlled; continue metoprolol succinate ER 50 mg 1 tab by mouth daily evening and Coumadin 2.5 mg 1/2 tab daily; check INR on 09/20/15  Hyperlipidemia - continue atorvastatin 80 mg 1 tab by mouth daily at bedtime  Depression - mood is stable; continue Escitalopram 20 mg 1 tab by mouth daily  Anxiety - continue Ativan 0.5 mg 1 tab by mouth twice a day when necessary  GERD - continue Protonix 40 mg 1 tab by mouth daily    Goals of care:  Short-term rehabilitation    Durenda Age, NP [place] [phone]

## 2015-09-20 ENCOUNTER — Non-Acute Institutional Stay (SKILLED_NURSING_FACILITY): Payer: Medicare Other | Admitting: Adult Health

## 2015-09-20 ENCOUNTER — Encounter: Payer: Self-pay | Admitting: Adult Health

## 2015-09-20 DIAGNOSIS — I480 Paroxysmal atrial fibrillation: Secondary | ICD-10-CM | POA: Diagnosis not present

## 2015-09-20 DIAGNOSIS — Z7901 Long term (current) use of anticoagulants: Secondary | ICD-10-CM

## 2015-09-20 NOTE — Progress Notes (Signed)
Patient ID: [name] [name], female   DOB: 13-Jun-1940, 75 y.o.   MRN: [id number] Subjective:     Indication: atrial fibrillation Bleeding signs/symptoms: None Thromboembolic signs/symptoms: None  Missed Coumadin doses: None Medication changes: no Dietary changes: no Bacterial/viral infection: yes - Ceftin Other concerns: no  The following portions of the patient's history were reviewed and updated as appropriate: allergies, current medications, past family history, past medical history, past social history, past surgical history and problem list.  Review of Systems A comprehensive review of systems was negative.   Objective:    INR Today: 1.3 Current dose: Coumadin 1.25 mg daily    Assessment:    Subtherapeutic INR for goal of 2-3   Plan:    1. New dose: Give Coumadin 3 mg by mouth 1 today then Coumadin 2 mg by mouth daily   2. Next INR: 09/23/15

## 2015-09-23 LAB — CBC AND DIFFERENTIAL
HCT: 31 % — AB (ref 36–46)
Hemoglobin: 9.7 g/dL — AB (ref 12.0–16.0)
PLATELETS: 566 10*3/uL — AB (ref 150–399)
WBC: 10.9 10*3/mL

## 2015-09-23 LAB — BASIC METABOLIC PANEL
BUN: 38 mg/dL — AB (ref 4–21)
CREATININE: 1.6 mg/dL — AB (ref 0.5–1.1)
Glucose: 81 mg/dL
Potassium: 4.3 mmol/L (ref 3.4–5.3)
Sodium: 139 mmol/L (ref 137–147)

## 2015-09-25 ENCOUNTER — Non-Acute Institutional Stay (SKILLED_NURSING_FACILITY): Payer: Medicare Other | Admitting: Internal Medicine

## 2015-09-25 ENCOUNTER — Encounter: Payer: Self-pay | Admitting: Internal Medicine

## 2015-09-25 DIAGNOSIS — I5022 Chronic systolic (congestive) heart failure: Secondary | ICD-10-CM | POA: Diagnosis not present

## 2015-09-25 DIAGNOSIS — F419 Anxiety disorder, unspecified: Secondary | ICD-10-CM | POA: Diagnosis not present

## 2015-09-25 DIAGNOSIS — I480 Paroxysmal atrial fibrillation: Secondary | ICD-10-CM | POA: Diagnosis not present

## 2015-09-25 DIAGNOSIS — I2583 Coronary atherosclerosis due to lipid rich plaque: Secondary | ICD-10-CM

## 2015-09-25 DIAGNOSIS — N390 Urinary tract infection, site not specified: Secondary | ICD-10-CM

## 2015-09-25 DIAGNOSIS — S82401S Unspecified fracture of shaft of right fibula, sequela: Secondary | ICD-10-CM | POA: Diagnosis not present

## 2015-09-25 DIAGNOSIS — K219 Gastro-esophageal reflux disease without esophagitis: Secondary | ICD-10-CM

## 2015-09-25 DIAGNOSIS — I2510 Atherosclerotic heart disease of native coronary artery without angina pectoris: Secondary | ICD-10-CM | POA: Diagnosis not present

## 2015-09-25 DIAGNOSIS — N183 Chronic kidney disease, stage 3 unspecified: Secondary | ICD-10-CM

## 2015-09-25 DIAGNOSIS — E1159 Type 2 diabetes mellitus with other circulatory complications: Secondary | ICD-10-CM

## 2015-09-25 DIAGNOSIS — F329 Major depressive disorder, single episode, unspecified: Secondary | ICD-10-CM

## 2015-09-25 DIAGNOSIS — G40909 Epilepsy, unspecified, not intractable, without status epilepticus: Secondary | ICD-10-CM | POA: Diagnosis not present

## 2015-09-25 DIAGNOSIS — R5381 Other malaise: Secondary | ICD-10-CM | POA: Diagnosis not present

## 2015-09-25 DIAGNOSIS — S82402S Unspecified fracture of shaft of left fibula, sequela: Secondary | ICD-10-CM

## 2015-09-25 DIAGNOSIS — B9620 Unspecified Escherichia coli [E. coli] as the cause of diseases classified elsewhere: Secondary | ICD-10-CM

## 2015-09-25 NOTE — Progress Notes (Signed)
LOCATION: [facility]  PCP: [name], MD   Code Status: DNR  Goals of care: Advanced Directive information Advanced Directives 09/19/2015  Does patient have an advance directive? Yes  Type of Advance Directive Out of facility DNR (pink MOST or yellow form)  Does patient want to make changes to advanced directive? No - Patient declined  Copy of advanced directive(s) in chart? Yes  Would patient like information on creating an advanced directive? -  Pre-existing out of facility DNR order (yellow form or pink MOST form) -       Extended Emergency Contact Information Primary Emergency Contact: Baskett,[name] Address: [place], [place] [address] [place] Phone: [phone] Mobile Phone: [phone] Relation: Spouse Secondary Emergency Contact: Smith,Margaret  [place] Phone: [phone] Mobile Phone: [phone] Relation: Sister   Allergies  Allergen Reactions  . Procaine Hcl Anaphylaxis    Chief Complaint  Patient presents with  . New Admit To SNF    New Admission     HPI:  Patient is a 75 y.o. female seen today for short term rehabilitation post hospital admission from 09/14/15-09/18/15 post fall with bilateral fibular fracture, right lateral malleolus fracture. Orthopedics was consulted. She had a splint placed to RLE and CAM boot to LLE. She had seizure and her dilantin dosing was adjusted. She was alos treated for e.coli UTI this admission. She is seen in her room today. She has PMH of diabetes mellitus, chronic systolic CHF, CAD, seizure, atrial fibrillation among others.    Review of Systems:  Constitutional: Negative for fever, chills. She feels weak and tired.  HENT: Negative for headache, congestion, nasal discharge Eyes: Negative for blurred vision, double vision and discharge.  Respiratory: Negative for cough, shortness of breath and wheezing.   Cardiovascular: Negative for chest  pain, palpitations, leg swelling.  Gastrointestinal: Negative for heartburn, nausea, vomiting, abdominal pain. She had a bowel movement yesterday. Genitourinary: Negative for dysuria.  Musculoskeletal: Negative for back pain, fall in the facility. pain medication has been helpful.  Skin: Negative for itching, rash.  Neurological: Negative for dizziness. Psychiatric/Behavioral: Negative for depression    Past Medical History:  Diagnosis Date  . Atrial fibrillation ([place])    SVT dx 2007, cath 2007 mild CAD, then had an cardioversion, ablation; still on coumadin , has occ palpitation, EKG 03-2010 NSR  . Blindness of left eye   . Common migraine    ?hx of  . DIABETES MELLITUS, TYPE II 03/27/2006   dr [name]  . Eye muscle weakness    Right eye weakness after cataract surgery  . GERD (gastroesophageal reflux disease) 10/05/2011  . Headache(784.0) 05/07/2009  . 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 HYPERLIPIDEMIA 03/27/2006  . HYPERTENSION 03/27/2006  . Intertrochanteric fracture of right hip ([place]) 07/13/2012  . 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 Osteopenia 2004   Dexa 2004 showed Osteopenia, DEXA 03/2007 normal  . Osteopenia   . Other chronic cystitis with hematuria   . Recurrent urinary tract infection    Seeing Urology  . 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 Seizures ([place])   . 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[place]) [id number]   Past Surgical History:  Procedure Laterality Date  . CARDIAC CATHETERIZATION N/A 05/17/2015   Procedure: Left Heart Cath and Coronary Angiography;  Surgeon: Jettie Booze, MD;  Location: [facility];  Service: Cardiovascular;  Laterality: N/A;  . CARDIAC CATHETERIZATION  N/A 05/17/2015   Procedure: Coronary Balloon Angioplasty;  Surgeon: Jettie Booze, MD;  Location: [facility];  Service: Cardiovascular;  Laterality: N/A;  . FEMUR IM NAIL Right 07/15/2012   Procedure: INTRAMEDULLARY (IM) NAIL HIP;  Surgeon: Johnny Bridge, MD;  Location: [facility];   Service: Orthopedics;  Laterality: Right;  . SHOULDER SURGERY     Social History:   reports that she quit smoking about 37 years ago. Her smoking use included Cigarettes. She has never used smokeless tobacco. She reports that she does not drink alcohol or use drugs.  Family History  Problem Relation Age of Onset  . Diabetes Father   . Heart attack Father 23  . Diabetes Sister   . Cancer Neg Hx     no hx of colon or breast cancer    Medications:   Medication List       Accurate as of 09/25/15  3:09 PM. Always use your most recent med list.          acetaminophen 325 MG tablet Commonly known as:  TYLENOL Take 650 mg by mouth every 6 (six) hours as needed for mild pain or moderate pain.   atorvastatin 80 MG tablet Commonly known as:  LIPITOR Take 80 mg by mouth daily.   calcium-vitamin D 500-200 MG-UNIT tablet Commonly known as:  OSCAL WITH D Take 1 tablet by mouth daily.   CENTRUM SILVER ADULT 50+ PO Take 1 tablet by mouth every morning. Reported on 08/07/2015   clopidogrel 75 MG tablet Commonly known as:  PLAVIX Take 1 tablet (75 mg total) by mouth daily.   escitalopram 10 MG tablet Commonly known as:  LEXAPRO TAKE TWO TABLETS BY MOUTH ONCE DAILY   furosemide 40 MG tablet Commonly known as:  LASIX Take 1 tablet (40 mg total) by mouth 2 (two) times daily.   insulin NPH-regular Human (70-30) 100 UNIT/ML injection Commonly known as:  NOVOLIN 70/30 Inject 12 Units into the skin at bedtime. Inject 30 units in the morning. 14 units 2 times daily with lunch and dinnner   lisinopril 5 MG tablet Commonly known as:  PRINIVIL,ZESTRIL Take 1 tablet (5 mg total) by mouth daily.   LORazepam 0.5 MG tablet Commonly known as:  ATIVAN TAKE ONE TABLET BY MOUTH TWICE DAILY AS NEEDED FOR ANXIETY   metoprolol succinate 50 MG 24 hr tablet Commonly known as:  TOPROL-XL Take 1 tablet by mouth in the evening. Take with or immediately following a meal.   nitroGLYCERIN 0.4 MG SL  tablet Commonly known as:  NITROSTAT Place 1 tablet (0.4 mg total) under the tongue every 5 (five) minutes as needed for chest pain.   pantoprazole 40 MG tablet Commonly known as:  PROTONIX Take 1 tablet (40 mg total) by mouth daily.   phenytoin 200 MG ER capsule Commonly known as:  DILANTIN Take 1 capsule (200 mg total) by mouth 2 (two) times daily.   PROCEL Powd Take 2 scoop by mouth 3 (three) times daily. Stop date 10/26/15   promethazine 12.5 MG tablet Commonly known as:  PHENERGAN TAKE ONE TABLET BY MOUTH EVERY 6 HOURS AS NEEDED FOR NAUSEA AND VOMITING   saccharomyces boulardii 250 MG capsule Commonly known as:  FLORASTOR Take 1 capsule (250 mg total) by mouth 2 (two) times daily.   warfarin 2.5 MG tablet Commonly known as:  COUMADIN Take 2.5 mg by mouth daily.       Immunizations: Immunization History  Administered Date(s) Administered  .  Influenza Split 01/19/2012  . Influenza, High Dose Seasonal PF 01/24/2013, 12/04/2014  . Influenza,inj,Quad PF,36+ Mos 11/24/2013  . PPD Test 09/18/2015  . Pneumococcal Conjugate-13 12/28/2014  . Pneumococcal Polysaccharide-23 01/24/2013  . Td 08/01/2007, 12/15/2010  . Tdap 01/22/2014  . Zoster 03/03/2003, 05/28/2013     Physical Exam:  Vitals:   09/25/15 1458  BP: 115/75  Pulse: 88  Resp: 18  Temp: 97.8 F (36.6 C)  TempSrc: Oral  SpO2: 98%  Weight: 202 lb (91.6 kg)  Height: 5\' 9"  (1.753 m)   Body mass index is 29.83 kg/m.  General- elderly female, overweight, in no acute distress Head- normocephalic, atraumatic Nose- no nasal discharge Throat- moist mucus membrane Eyes- PERRLA, EOMI, no pallor, no icterus, no discharge, normal conjunctiva, normal sclera Neck- no cervical lymphadenopathy Cardiovascular- normal s1,s2, no murmur Respiratory- bilateral clear to auscultation Abdomen- bowel sounds present, soft, non tender Musculoskeletal- able to move all 4 extremities, splint to RLE and CAM boot to  LLE Neurological- alert and oriented to person, place and time Skin- warm and dry Psychiatry- normal mood and affect    Labs reviewed: Basic Metabolic Panel:  Recent Labs  05/11/15 0535  09/15/15 0330 09/17/15 0547 09/17/15 1837 09/18/15 0447 09/23/15  NA 140  < > 130* 135  --  135 139  K 3.4*  < > 3.8 3.7  --  4.0 4.3  CL 103  < > 94* 98*  --  99*  --   CO2 23  < > 26 27  --  25  --   GLUCOSE 152*  < > 151* 104*  --  188*  --   BUN 18  < > 35* 24*  --  21* 38*  CREATININE 1.28*  < > 2.01* 1.49*  --  1.29* 1.6*  CALCIUM 8.7*  < > 8.5* 8.8*  --  8.5*  --   MG  --   --   --   --  1.7  --   --   PHOS 2.3*  --   --   --   --   --   --   < > = values in this interval not displayed. Liver Function Tests:  Recent Labs  05/20/15 0110 08/01/15 1512 09/18/15 0447  AST 39 13 21  ALT 18 7 14   ALKPHOS 112 134* 85  BILITOT 0.5 0.4 0.4  PROT 6.7 7.1 6.3*  ALBUMIN 2.6* 3.4* 2.4*    Recent Labs  05/07/15 0349 05/20/15 0110  LIPASE 15 15   No results for input(s): AMMONIA in the last 8760 hours. CBC:  Recent Labs  05/29/15 1215 08/01/15 1512 09/14/15 1630 09/15/15 0330 09/23/15  WBC 7.0 10.6 12.8* 9.4 10.9  NEUTROABS 4.7 6,784 9.3*  --   --   HGB 10.1* 10.8* 10.6* 9.7* 9.7*  HCT 30.8* 33.0* 31.5* 30.3* 31*  MCV 91.3 89.2 86.8 87.1  --   PLT 387.0 444* 284 277 566*   Cardiac Enzymes:  Recent Labs  05/20/15 0110 05/20/15 0610 05/20/15 1250  TROPONINI 15.66* 13.12* 10.69*   BNP: Invalid input(s): POCBNP CBG:  Recent Labs  09/17/15 2126 09/18/15 0702 09/18/15 1146  GLUCAP 88 217* 353*   09/23/15 phenytoin level 6.1  Radiological Exams: Dg Tibia/fibula Right  Result Date: 09/14/2015 CLINICAL DATA:  Right lower leg pain after fall today. EXAM: RIGHT TIBIA AND FIBULA - 2 VIEW COMPARISON:  Radiographs of Jul 13, 2012. FINDINGS: Possible minimally displaced fracture involving the posterior aspect of the  distal tibia. Ankle radiographs are recommended for  further evaluation. Also noted is probable minimally displaced fracture involving the distal right fibula. Vascular calcifications are noted in the soft tissues. IMPRESSION: Possible minimally displaced posterior malleolar fracture of distal tibia. Probable minimally displaced distal right fibular fracture. Ankle radiographs are recommended for further evaluation. Electronically Signed   By: Marijo Conception, M.D.   On: 09/14/2015 15:58   Dg Ankle Complete Left  Result Date: 09/16/2015 CLINICAL DATA:  Pain following fall EXAM: LEFT ANKLE COMPLETE - 3+ VIEW COMPARISON:  None. FINDINGS: Frontal, oblique, and lateral views were obtained. There is a comminuted fracture of the distal fibular diaphysis with fracture fragments in overall near anatomic alignment. No other fractures are evident. No dislocation or appreciable joint effusion. The ankle mortise appears intact. There is no appreciable joint space narrowing. IMPRESSION: Comminuted fracture distal fibular diaphysis with fracture fragments in overall near anatomic alignment. Electronically Signed   By: Lowella Grip III M.D.   On: 09/16/2015 11:48   Dg Ankle Complete Right  Result Date: 09/14/2015 CLINICAL DATA:  Lateral right ankle pain and swelling EXAM: RIGHT ANKLE - COMPLETE 3+ VIEW COMPARISON:  None. FINDINGS: There is mild diffuse osteopenia. Lateral soft tissue swelling is identified. A nondisplaced fracture involving the distal fibula is suspected. No additional fractures identified. IMPRESSION: Suspect nondisplaced distal fibular fracture. The possible posterior malleolar fracture is not well visualized on this study. Given the treatment implications a CT of the right ankle would seem reasonable to completely assess the extent of fractures. Electronically Signed   By: Kerby Moors M.D.   On: 09/14/2015 17:05   Ct Ankle Right Wo Contrast  Result Date: 09/14/2015 CLINICAL DATA:  SMA 51-year-old female with fall and right ankle pain and  fracture. EXAM: CT OF THE RIGHT ANKLE WITHOUT CONTRAST TECHNIQUE: Multidetector CT imaging of the right ankle was performed according to the standard protocol. Multiplanar CT image reconstructions were also generated. COMPARISON:  Right lower extremity radiograph dated 09/14/2015 FINDINGS: There is a nondisplaced oblique fracture of the distal fibular with extension into the lateral malleolus. There is a nondisplaced fracture of the posterior malleolus with involvement of the posterior articular surface of the tibial plafond. No other fracture identified. The bones are osteopenic. There is no dislocation. The ankle mortise intact. There is soft swelling around the ankle primarily over the lateral malleolus. No fluid collection no large hematoma. IMPRESSION: Nondisplaced oblique fracture of the distal fibula and lateral malleolus as well as nondisplaced fracture of the posterior malleolus. No dislocation. Electronically Signed   By: Anner Crete M.D.   On: 09/14/2015 18:27   Dg Foot Complete Left  Result Date: 09/16/2015 CLINICAL DATA:  75 year old female with history of trauma from a fall several days ago complaining of left foot and ankle pain. EXAM: LEFT FOOT - COMPLETE 3+ VIEW COMPARISON:  No priors. FINDINGS: Three views of the left foot demonstrate no acute displaced fracture, subluxation or dislocation in the foot. There is, however, a nondisplaced spiral type fracture of the distal third of the fibular diaphysis shortly above the lateral malleolus. IMPRESSION: 1. Nondisplaced fracture of the distal third of the fibular diaphysis. 2. No acute traumatic findings in the foot. Electronically Signed   By: Vinnie Langton M.D.   On: 09/16/2015 11:46    Assessment/Plan  Physical deconditioning Will have patient work with PT/OT as tolerated to regain strength and restore function.  Fall precautions are in place.  Bilateral fibular fracture S/p CAM boot  to LLE and splint to RLE. Has orthopedic follow  up. Continue tylenol 650 mg q6h prn pain and to be NWB to LE for now. Will have her work with physical therapy and occupational therapy team to help with gait training and muscle strengthening exercises.fall precautions. Skin care. Encourage to be out of bed.   Seizure disorder  Dilantin level is sub therapeutic. Will increase her dilantin to 300 mg bid for now and monitor for seizures. Has neurology f/u and to recheck dilantin level in a week  E.coli UTI  Has completed her cefuroxime, denies any symptom today, hydration to be maintained  Anemia of chronic disease Monitor cbc  afib Rate controlled. Continue metoprolol succinate er 50 mg daily and xoumadin for anticoagulation, check inr 09/26/15  ckd stage 3 Monitor bmp  Chronic systolic chf Continue her metoprolol, lisinopril and lasix, check bmp and weight  Coronary artery disease Chest pain free. 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 Continue humulin 70/30 and monitor her cbg, continue skin care  Chronic depression  continue escitalopram 20 mg daily  Anxiety  continue Ativan 0.5 mg bid prn and monitor    Goals of care: short term rehabilitation   Labs/tests ordered: cbc, cmp 09/30/15 and phenytoin level 10/02/15   Family/ staff Communication: reviewed care plan with patient and nursing supervisor    Blanchie Serve, MD Internal Medicine [place], [place] [address] Cell Phone (Monday-Friday 8 am - 5 pm): [phone] On Call: [phone] and follow prompts after 5 pm and on weekends Office Phone: [phone] Office Fax: [phone]

## 2015-09-27 ENCOUNTER — Other Ambulatory Visit: Payer: Medicare Other

## 2015-09-30 LAB — BASIC METABOLIC PANEL
BUN: 48 mg/dL — AB (ref 4–21)
CREATININE: 1.9 mg/dL — AB (ref 0.5–1.1)
GLUCOSE: 102 mg/dL
POTASSIUM: 5.1 mmol/L (ref 3.4–5.3)
Sodium: 138 mmol/L (ref 137–147)

## 2015-09-30 LAB — CBC AND DIFFERENTIAL
HCT: 33 % — AB (ref 36–46)
Hemoglobin: 10.2 g/dL — AB (ref 12.0–16.0)
Neutrophils Absolute: 9 /uL
PLATELETS: 472 10*3/uL — AB (ref 150–399)
WBC: 12.7 10*3/mL

## 2015-09-30 LAB — HEPATIC FUNCTION PANEL
ALT: 9 U/L (ref 7–35)
AST: 14 U/L (ref 13–35)
Alkaline Phosphatase: 147 U/L — AB (ref 25–125)
Bilirubin, Total: 0.5 mg/dL

## 2015-10-03 LAB — BASIC METABOLIC PANEL
BUN: 47 mg/dL — AB (ref 4–21)
CREATININE: 1.8 mg/dL — AB (ref 0.5–1.1)
GLUCOSE: 114 mg/dL
Potassium: 4.9 mmol/L (ref 3.4–5.3)
Sodium: 139 mmol/L (ref 137–147)

## 2015-10-07 ENCOUNTER — Non-Acute Institutional Stay (SKILLED_NURSING_FACILITY): Payer: Medicare Other | Admitting: Adult Health

## 2015-10-07 ENCOUNTER — Encounter: Payer: Self-pay | Admitting: Adult Health

## 2015-10-07 DIAGNOSIS — F329 Major depressive disorder, single episode, unspecified: Secondary | ICD-10-CM

## 2015-10-07 DIAGNOSIS — I2510 Atherosclerotic heart disease of native coronary artery without angina pectoris: Secondary | ICD-10-CM

## 2015-10-07 DIAGNOSIS — N183 Chronic kidney disease, stage 3 unspecified: Secondary | ICD-10-CM

## 2015-10-07 DIAGNOSIS — F419 Anxiety disorder, unspecified: Secondary | ICD-10-CM

## 2015-10-07 DIAGNOSIS — E785 Hyperlipidemia, unspecified: Secondary | ICD-10-CM | POA: Diagnosis not present

## 2015-10-07 DIAGNOSIS — I5022 Chronic systolic (congestive) heart failure: Secondary | ICD-10-CM

## 2015-10-07 DIAGNOSIS — R5381 Other malaise: Secondary | ICD-10-CM | POA: Diagnosis not present

## 2015-10-07 DIAGNOSIS — I480 Paroxysmal atrial fibrillation: Secondary | ICD-10-CM

## 2015-10-07 DIAGNOSIS — K219 Gastro-esophageal reflux disease without esophagitis: Secondary | ICD-10-CM

## 2015-10-07 DIAGNOSIS — S82401S Unspecified fracture of shaft of right fibula, sequela: Secondary | ICD-10-CM

## 2015-10-07 DIAGNOSIS — Z7901 Long term (current) use of anticoagulants: Secondary | ICD-10-CM

## 2015-10-07 DIAGNOSIS — I2583 Coronary atherosclerosis due to lipid rich plaque: Secondary | ICD-10-CM

## 2015-10-07 DIAGNOSIS — I10 Essential (primary) hypertension: Secondary | ICD-10-CM

## 2015-10-07 DIAGNOSIS — G40909 Epilepsy, unspecified, not intractable, without status epilepticus: Secondary | ICD-10-CM

## 2015-10-07 DIAGNOSIS — E1159 Type 2 diabetes mellitus with other circulatory complications: Secondary | ICD-10-CM | POA: Diagnosis not present

## 2015-10-07 DIAGNOSIS — S82402S Unspecified fracture of shaft of left fibula, sequela: Secondary | ICD-10-CM

## 2015-10-07 NOTE — Progress Notes (Signed)
Patient ID: [name] [name], female   DOB: [birth year], 75 y.o.   MRN: [id number]    DATE:  10/07/2015   MRN:  [id number]  BIRTHDAY: 1940-06-10  Facility:  [facility]ing Home Location:  [place] [facility] [place] Room Number: 705-P  LEVEL OF CARE:  SNF (31)  Contact Information    Name Relation Home Work Mobile   [place] Spouse [phone]  [phone]   Smith,Margaret Sister [phone]  [phone]       Code Status History    Date Active Date Inactive Code Status Order ID Comments User Context   09/14/2015  9:09 PM 09/18/2015  5:33 PM Full Code [id number]  Quintella Baton, MD Inpatient   05/17/2015 12:53 AM 05/21/2015  6:19 PM Full Code [id number]  Rise Patience, MD Inpatient   05/07/2015 12:39 AM 05/11/2015  8:37 PM Full Code [id number]  Rise Patience, MD ED   01/22/2014 10:45 PM 01/24/2014  6:39 PM Full Code [id number]  Etta Quill, DO Inpatient   11/18/2012  6:59 PM 11/21/2012  7:20 PM Full Code [id number]  Verlee Monte, MD Inpatient   03/03/2012  1:02 PM 03/04/2012  6:58 PM Full Code [id number]  Louellen Molder, MD Inpatient   05/22/2011  2:03 PM 06/02/2011  7:27 PM Full Code [id number]  Karen Kays, NP ED   05/16/2011  8:54 PM 05/19/2011  7:11 PM Full Code [id number]  Theressa Millard, MD Inpatient    Advance Directive Documentation        Most Recent Value   Type of Advance Directive  Out of facility DNR (pink MOST or yellow form)   Pre-existing out of facility DNR order (yellow form or pink MOST form)     "MOST" Form in Place?         Chief Complaint  Patient presents with  . Discharge Note    HISTORY OF PRESENT ILLNESS:  [name] who is for discharge home with Home health PT, OT, CNA, SW and Nursing. DME:  Standard wheelchair, cushion, removable arm rest, elevating leg  Anti-tippers, semi-electric hospital bed, slide bar, drop arm bariatric bedside commode due to wide hips .  She has been admitted to [facility] on 09/18/15  from [facility]. She has PMH of diabetes mellitus, chronic systolic CHF, CAD and atrial fibrillation. She was having occasional episodes of full body shaking with questionable loss of consciousness. She had a fall and sustained bilateral fibular fractures:  right lateral and posterior malleolus fracture and left fibular fracture in the distal third of the fibula. Orthopedics was consulted and recommended nonoperative management and nonweightbearing for the next 6 weeks. She was placed in a splint on the RLE and CAM boot on LLE. She was loaded with Dilantin 13 on 2 separate occasions while in the hospital and maintenance Dilantin dose was increased to 200 mg twice a day.  Patient was admitted to this facility for short-term rehabilitation after the patient's recent hospitalization.  Patient has completed SNF rehabilitation and therapy has cleared the patient for discharge.   PAST MEDICAL HISTORY:  Past Medical History:  Diagnosis Date  . Atrial fibrillation ([place])    SVT dx 2007, cath 2007 mild CAD, then had an cardioversion, ablation; still on coumadin , has occ palpitation, EKG 03-2010 NSR  . Blindness of left eye   . Common migraine    ?hx of  . DIABETES MELLITUS, TYPE II 03/27/2006   dr [name]  . Eye muscle  weakness    Right eye weakness after cataract surgery  . GERD (gastroesophageal reflux disease) 10/05/2011  . Headache(784.0) 05/07/2009  . 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 HYPERLIPIDEMIA 03/27/2006  . HYPERTENSION 03/27/2006  . Intertrochanteric fracture of right hip ([place]) 07/13/2012  . 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 Osteopenia 2004   Dexa 2004 showed Osteopenia, DEXA 03/2007 normal  . Osteopenia   . Other chronic cystitis with hematuria   . Recurrent urinary tract infection    Seeing Urology  . 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 Seizures ([place])   . 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[place]) [id number]     CURRENT MEDICATIONS: Reviewed  Patient's Medications  New Prescriptions    No medications on file  Previous Medications   ACETAMINOPHEN (TYLENOL) 325 MG TABLET    Take 650 mg by mouth every 6 (six) hours as needed for mild pain or moderate pain.   ATORVASTATIN (LIPITOR) 80 MG TABLET    Take 80 mg by mouth daily.   CALCIUM-VITAMIN D (OSCAL WITH D) 500-200 MG-UNIT PER TABLET    Take 1 tablet by mouth daily.   CLOPIDOGREL (PLAVIX) 75 MG TABLET    Take 1 tablet (75 mg total) by mouth daily.   ESCITALOPRAM (LEXAPRO) 10 MG TABLET    TAKE TWO TABLETS BY MOUTH ONCE DAILY   FUROSEMIDE (LASIX) 40 MG TABLET    Take 1 tablet (40 mg total) by mouth 2 (two) times daily.   INSULIN NPH-REGULAR HUMAN (NOVOLIN 70/30) (70-30) 100 UNIT/ML INJECTION    Inject 12 Units into the skin at bedtime. Inject 30 units in the morning. 14 units 2 times daily with lunch and dinnner   LISINOPRIL (PRINIVIL,ZESTRIL) 5 MG TABLET    Take 1 tablet (5 mg total) by mouth daily.   LORAZEPAM (ATIVAN) 0.5 MG TABLET    TAKE ONE TABLET BY MOUTH TWICE DAILY AS NEEDED FOR ANXIETY   METOPROLOL SUCCINATE (TOPROL-XL) 50 MG 24 HR TABLET    Take 1 tablet by mouth in the evening. Take with or immediately following a meal.   MULTIPLE VITAMINS-MINERALS (CENTRUM SILVER ADULT 50+ PO)    Take 1 tablet by mouth every morning. Reported on 08/07/2015   NITROFURANTOIN (MACRODANTIN) 100 MG CAPSULE    Take 100 mg by mouth 2 (two) times daily.   NITROGLYCERIN (NITROSTAT) 0.4 MG SL TABLET    Place 1 tablet (0.4 mg total) under the tongue every 5 (five) minutes as needed for chest pain.   PANTOPRAZOLE (PROTONIX) 40 MG TABLET    Take 1 tablet (40 mg total) by mouth daily.   PHENYTOIN (DILANTIN) 200 MG ER CAPSULE    Take 1 capsule (200 mg total) by mouth 2 (two) times daily.   PROMETHAZINE (PHENERGAN) 12.5 MG TABLET    TAKE ONE TABLET BY MOUTH EVERY 6 HOURS AS NEEDED FOR NAUSEA AND VOMITING   PROTEIN (PROCEL) POWD    Take 2 scoop by mouth 3 (three) times daily. Stop date 10/26/15   SACCHAROMYCES BOULARDII (FLORASTOR) 250 MG CAPSULE    Take  1 capsule (250 mg total) by mouth 2 (two) times daily.   WARFARIN (COUMADIN) 2.5 MG TABLET    Take 2.5 mg by mouth daily.  Modified Medications   No medications on file  Discontinued Medications   No medications on file     Allergies  Allergen Reactions  . Procaine Hcl Anaphylaxis     REVIEW OF SYSTEMS:  GENERAL: no change in appetite, no fatigue, no weight changes,  no fever, chills or weakness EYES: Denies change in vision, dry eyes, eye pain, itching or discharge EARS: Denies change in hearing, ringing in ears, or earache NOSE: Denies nasal congestion or epistaxis MOUTH and THROAT: Denies oral discomfort, gingival pain or bleeding, pain from teeth or hoarseness   RESPIRATORY: no cough, SOB, DOE, wheezing, hemoptysis CARDIAC: no chest pain, edema or palpitations GI: no abdominal pain, diarrhea, constipation, heart burn, nausea or vomiting GU: Denies dysuria, frequency, hematuria, incontinence, or discharge PSYCHIATRIC: Denies feeling of depression or anxiety. No report of hallucinations, insomnia, paranoia, or agitation   PHYSICAL EXAMINATION  GENERAL APPEARANCE: Well nourished. In no acute distress. Normal body habitus SKIN:  Skin is warm and dry.  HEAD: Normal in size and contour. No evidence of trauma EYES: Lids open and close normally. No blepharitis, entropion or ectropion. PERRL. Conjunctivae are clear and sclerae are white. Lenses are without opacity EARS: Pinnae are normal. Patient hears normal voice tunes of the examiner MOUTH and THROAT: Lips are without lesions. Oral mucosa is moist and without lesions. Tongue is normal in shape, size, and color and without lesions NECK: supple, trachea midline, no neck masses, no thyroid tenderness, no thyromegaly LYMPHATICS: no LAN in the neck, no supraclavicular LAN RESPIRATORY: breathing is even & unlabored, BS CTAB CARDIAC: RRR, no murmur,no extra heart sounds, no edema GI: abdomen soft, normal BS, no masses, no tenderness,  no hepatomegaly, no splenomegaly EXTREMITIES:  Able to move X 4 extremities; RLE with splint , LLE with camboot PSYCHIATRIC: Alert and oriented X 3. Affect and behavior are appropriate  LABS/RADIOLOGY: Labs reviewed: Basic Metabolic Panel:  Recent Labs  05/11/15 0535  09/15/15 0330 09/17/15 0547 09/17/15 1837 09/18/15 0447 09/23/15 09/30/15 1309 10/03/15 1348  NA 140  < > 130* 135  --  135 139 138 139  K 3.4*  < > 3.8 3.7  --  4.0 4.3 5.1 4.9  CL 103  < > 94* 98*  --  99*  --   --   --   CO2 23  < > 26 27  --  25  --   --   --   GLUCOSE 152*  < > 151* 104*  --  188*  --   --   --   BUN 18  < > 35* 24*  --  21* 38* 48* 47*  CREATININE 1.28*  < > 2.01* 1.49*  --  1.29* 1.6* 1.9* 1.8*  CALCIUM 8.7*  < > 8.5* 8.8*  --  8.5*  --   --   --   MG  --   --   --   --  1.7  --   --   --   --   PHOS 2.3*  --   --   --   --   --   --   --   --   < > = values in this interval not displayed. Liver Function Tests:  Recent Labs  05/20/15 0110 08/01/15 1512 09/18/15 0447 09/30/15 1309  AST 39 13 21 14   ALT 18 7 14 9   ALKPHOS 112 134* 85 147*  BILITOT 0.5 0.4 0.4  --   PROT 6.7 7.1 6.3*  --   ALBUMIN 2.6* 3.4* 2.4*  --     Recent Labs  05/07/15 0349 05/20/15 0110  LIPASE 15 15   CBC:  Recent Labs  08/01/15 1512 09/14/15 1630 09/15/15 0330 09/23/15 09/30/15 1309  WBC 10.6 12.8* 9.4 10.9 12.7  NEUTROABS 6,784 9.3*  --   --  9  HGB 10.8* 10.6* 9.7* 9.7* 10.2*  HCT 33.0* 31.5* 30.3* 31* 33*  MCV 89.2 86.8 87.1  --   --   PLT 444* 284 277 566* 472*   Lipid Panel:  Recent Labs  08/29/15 1215  HDL 53.60   Cardiac Enzymes:  Recent Labs  05/20/15 0110 05/20/15 0610 05/20/15 1250  TROPONINI 15.66* 13.12* 10.69*   CBG:  Recent Labs  09/17/15 2126 09/18/15 0702 09/18/15 1146  GLUCAP 88 217* 353*      Dg Tibia/fibula Right  Result Date: 09/14/2015 CLINICAL DATA:  Right lower leg pain after fall today. EXAM: RIGHT TIBIA AND FIBULA - 2 VIEW COMPARISON:   Radiographs of Jul 13, 2012. FINDINGS: Possible minimally displaced fracture involving the posterior aspect of the distal tibia. Ankle radiographs are recommended for further evaluation. Also noted is probable minimally displaced fracture involving the distal right fibula. Vascular calcifications are noted in the soft tissues. IMPRESSION: Possible minimally displaced posterior malleolar fracture of distal tibia. Probable minimally displaced distal right fibular fracture. Ankle radiographs are recommended for further evaluation. Electronically Signed   By: Marijo Conception, M.D.   On: 09/14/2015 15:58   Dg Ankle Complete Left  Result Date: 09/16/2015 CLINICAL DATA:  Pain following fall EXAM: LEFT ANKLE COMPLETE - 3+ VIEW COMPARISON:  None. FINDINGS: Frontal, oblique, and lateral views were obtained. There is a comminuted fracture of the distal fibular diaphysis with fracture fragments in overall near anatomic alignment. No other fractures are evident. No dislocation or appreciable joint effusion. The ankle mortise appears intact. There is no appreciable joint space narrowing. IMPRESSION: Comminuted fracture distal fibular diaphysis with fracture fragments in overall near anatomic alignment. Electronically Signed   By: Lowella Grip III M.D.   On: 09/16/2015 11:48   Dg Ankle Complete Right  Result Date: 09/14/2015 CLINICAL DATA:  Lateral right ankle pain and swelling EXAM: RIGHT ANKLE - COMPLETE 3+ VIEW COMPARISON:  None. FINDINGS: There is mild diffuse osteopenia. Lateral soft tissue swelling is identified. A nondisplaced fracture involving the distal fibula is suspected. No additional fractures identified. IMPRESSION: Suspect nondisplaced distal fibular fracture. The possible posterior malleolar fracture is not well visualized on this study. Given the treatment implications a CT of the right ankle would seem reasonable to completely assess the extent of fractures. Electronically Signed   By: Kerby Moors M.D.   On: 09/14/2015 17:05   Ct Ankle Right Wo Contrast  Result Date: 09/14/2015 CLINICAL DATA:  SMA 56-year-old female with fall and right ankle pain and fracture. EXAM: CT OF THE RIGHT ANKLE WITHOUT CONTRAST TECHNIQUE: Multidetector CT imaging of the right ankle was performed according to the standard protocol. Multiplanar CT image reconstructions were also generated. COMPARISON:  Right lower extremity radiograph dated 09/14/2015 FINDINGS: There is a nondisplaced oblique fracture of the distal fibular with extension into the lateral malleolus. There is a nondisplaced fracture of the posterior malleolus with involvement of the posterior articular surface of the tibial plafond. No other fracture identified. The bones are osteopenic. There is no dislocation. The ankle mortise intact. There is soft swelling around the ankle primarily over the lateral malleolus. No fluid collection no large hematoma. IMPRESSION: Nondisplaced oblique fracture of the distal fibula and lateral malleolus as well as nondisplaced fracture of the posterior malleolus. No dislocation. Electronically Signed   By: Anner Crete M.D.   On: 09/14/2015 18:27   Dg Foot Complete Left  Result Date: 09/16/2015  CLINICAL DATA:  75 year old female with history of trauma from a fall several days ago complaining of left foot and ankle pain. EXAM: LEFT FOOT - COMPLETE 3+ VIEW COMPARISON:  No priors. FINDINGS: Three views of the left foot demonstrate no acute displaced fracture, subluxation or dislocation in the foot. There is, however, a nondisplaced spiral type fracture of the distal third of the fibular diaphysis shortly above the lateral malleolus. IMPRESSION: 1. Nondisplaced fracture of the distal third of the fibular diaphysis. 2. No acute traumatic findings in the foot. Electronically Signed   By: Vinnie Langton M.D.   On: 09/16/2015 11:46    ASSESSMENT/PLAN:  Physical deconditioning - for Home health PT, OT, CNA, SW and  Nursing, for therapeutic strengthening exercises; fall precaution  Bilateral fibular fracture - orthopedics was consulted and recommended nonoperative management, S/P application of splint on RLE and CAM boot on LLE; follow-up with orthopedics ; continue acetaminophen 325 mg take 2 tabs = 650 mg by mouth every 6 hours when necessary for pain; NWB on BLE   Seizure disorder - she was given 2 loads of Dilantin while in the hospital; EEG is normal; continue phenytoin 200 mg by mouth twice a day; phenytoin level on 09/23/15; follow-up with Dr. Leonie Man, neurology  UTI - resolved  Coronary artery disease - stable; continue Plavix 75 mg 1 tab by mouth daily, NTG PRN and metoprolol succinate ER 50 mg 1 tab by mouth every evening  Chronic systolic CHF - EF [id number]; continue Lasix 40 mg 1 tab by mouth twice a day; check BMP on 09/23/15  Chronic kidney disease, stage III - Baseline creatinine 1.1-1.5; check BMP in 1 week Lab Results  Component Value Date   CREATININE 1.8 (A) 10/03/2015   Diabetes mellitus, type II - continue Humulin 70-30   30 units every morning, 14 units at lunch and dinner  and 12 units Q HS  Lab Results  Component Value Date   HGBA1C 6.3 06/17/2015   Hypertension - continue lisinopril 5 mg 1 tab by mouth daily and metoprolol succinate ER 50 mg 1 tab by mouth daily evening   Atrial fibrillation - rate controlled; continue metoprolol succinate ER 50 mg 1 tab by mouth daily evening and Coumadin   Long-term use of anticoagulant - INR 3.6, supratherapeutic; hold Coumadin and re-check INR on 10/08/15  Hyperlipidemia - continue atorvastatin 80 mg 1 tab by mouth daily at bedtime  Depression - mood is stable; continue Escitalopram 20 mg 1 tab by mouth daily  Anxiety - continue Ativan 0.5 mg 1 tab by mouth twice a day when necessary  GERD - continue Protonix 40 mg 1 tab by mouth daily     I have filled out patient's discharge paperwork and written prescriptions.  Patient will receive  home health PT, OT, SW, Nursing and CNA.  DME provided:  Standard wheelchair, cushion, removable arm rest, elevating leg  Anti-tippers, semi-electric hospital bed, slide bar, drop arm bariatric bedside commode due to wide hips  Total discharge time: Greater than 30 minutes Greater than 50% was spent in counseling and coordination of care with the patient.   Discharge time involved coordination of the discharge process with social worker, nursing staff and therapy department. Medical justification for home health services/DME verified.    Durenda Age, NP [employer] [phone]

## 2015-10-08 ENCOUNTER — Other Ambulatory Visit: Payer: Self-pay | Admitting: Internal Medicine

## 2015-10-09 NOTE — Telephone Encounter (Signed)
Pt is requesting refill on Lorazepam and Promethazine.  Last OV: 08/29/2015 Last Fill on Lorazepam: 09/02/2015 #60 and 0RF By Dr. Charlett Blake Last Fill on Promethazine: 09/02/2015 #30 and 0RF By Dr. Charlett Blake UDS: 11/24/2013 Low risk   Please advise.

## 2015-10-09 NOTE — Telephone Encounter (Signed)
Rx's faxed to [facility].

## 2015-10-09 NOTE — Telephone Encounter (Signed)
Rx's printed, awaiting MD signature.  

## 2015-10-09 NOTE — Telephone Encounter (Signed)
Okay 60 and 30

## 2015-10-10 ENCOUNTER — Ambulatory Visit (INDEPENDENT_AMBULATORY_CARE_PROVIDER_SITE_OTHER): Payer: Medicare Other | Admitting: Cardiology

## 2015-10-10 DIAGNOSIS — Z5181 Encounter for therapeutic drug level monitoring: Secondary | ICD-10-CM

## 2015-10-10 LAB — POCT INR: INR: 3.1

## 2015-10-10 MED ORDER — WARFARIN SODIUM 2.5 MG PO TABS: 2.5000 mg | ORAL_TABLET | Freq: Every day | ORAL | 1 refills | Status: DC

## 2015-10-17 ENCOUNTER — Ambulatory Visit (INDEPENDENT_AMBULATORY_CARE_PROVIDER_SITE_OTHER): Payer: Medicare Other

## 2015-10-17 DIAGNOSIS — Z5181 Encounter for therapeutic drug level monitoring: Secondary | ICD-10-CM

## 2015-10-17 DIAGNOSIS — I4891 Unspecified atrial fibrillation: Secondary | ICD-10-CM

## 2015-10-17 LAB — POCT INR: INR: 1.8

## 2015-10-18 ENCOUNTER — Ambulatory Visit: Payer: Medicare Other | Admitting: Neurology

## 2015-10-21 ENCOUNTER — Telehealth: Payer: Self-pay | Admitting: *Deleted

## 2015-10-21 NOTE — Telephone Encounter (Signed)
Received Physician's Order paperwork via fax from [facility].  Placed in folder for Dr. Larose Kells to review and sign.//AB/CMA

## 2015-10-24 ENCOUNTER — Ambulatory Visit (INDEPENDENT_AMBULATORY_CARE_PROVIDER_SITE_OTHER): Payer: Medicare Other

## 2015-10-24 DIAGNOSIS — I4891 Unspecified atrial fibrillation: Secondary | ICD-10-CM

## 2015-10-24 DIAGNOSIS — Z5181 Encounter for therapeutic drug level monitoring: Secondary | ICD-10-CM

## 2015-10-24 LAB — POCT INR: INR: 2.5

## 2015-10-25 ENCOUNTER — Telehealth: Payer: Self-pay

## 2015-10-25 NOTE — Telephone Encounter (Signed)
Received fax for the following:    Order Description: INR 2.5 continue current dose of 5 mg daily except 7.5 mg on Tuesday and Friday.  Recheck INR via fingerstick or ventipuncture on Sept. 5.    SN Effective 11/03/15 1 Wk1  Order numbered and forwarded to PCP.

## 2015-10-28 ENCOUNTER — Telehealth: Payer: Self-pay | Admitting: Internal Medicine

## 2015-10-28 NOTE — Telephone Encounter (Signed)
Spoke w/ Janett Billow, she informed me that Pt's L hand has been itching and is now red and swollen from scratching. Pt denies bug bites to her knowledge. Recommended Benadryl/Calamine lotion for itching and can try Hydrocortisone cream. Instructed Janett Billow that if not improving she will need to be seen.

## 2015-10-28 NOTE — Telephone Encounter (Signed)
°  Relationship to patient: Self Can be reached: [phone]   Reason for call: Request call back to discuss possible bite that patient has and how they should treat it

## 2015-10-29 NOTE — Telephone Encounter (Signed)
Written order for the same faxed to office.  Order numbered and placed in Dr. Ethel Rana red folder for review and signature.

## 2015-10-30 NOTE — Telephone Encounter (Signed)
Faxed back to [facility]. Needs to be signed by Cardio.

## 2015-10-30 NOTE — Telephone Encounter (Signed)
Reviewed and signed by PCP. Faxed to [facility] and sent for scanning.

## 2015-10-31 ENCOUNTER — Other Ambulatory Visit: Payer: Self-pay | Admitting: Adult Health

## 2015-11-01 ENCOUNTER — Other Ambulatory Visit: Payer: Self-pay | Admitting: Adult Health

## 2015-11-05 ENCOUNTER — Ambulatory Visit (INDEPENDENT_AMBULATORY_CARE_PROVIDER_SITE_OTHER): Payer: Medicare Other | Admitting: Cardiology

## 2015-11-05 DIAGNOSIS — Z5181 Encounter for therapeutic drug level monitoring: Secondary | ICD-10-CM

## 2015-11-05 DIAGNOSIS — I4891 Unspecified atrial fibrillation: Secondary | ICD-10-CM

## 2015-11-05 LAB — POCT INR: INR: 1.1

## 2015-11-05 NOTE — Telephone Encounter (Signed)
Form faxed back to [facility] informing that Cardiology will need to sign INR orders as PCP does not manage Coumadin.

## 2015-11-11 ENCOUNTER — Ambulatory Visit (INDEPENDENT_AMBULATORY_CARE_PROVIDER_SITE_OTHER): Payer: Medicare Other | Admitting: Internal Medicine

## 2015-11-11 DIAGNOSIS — I4891 Unspecified atrial fibrillation: Secondary | ICD-10-CM

## 2015-11-11 DIAGNOSIS — Z5181 Encounter for therapeutic drug level monitoring: Secondary | ICD-10-CM

## 2015-11-11 LAB — POCT INR: INR: 1.8

## 2015-11-12 ENCOUNTER — Other Ambulatory Visit: Payer: Self-pay | Admitting: Adult Health

## 2015-11-12 ENCOUNTER — Other Ambulatory Visit: Payer: Self-pay | Admitting: Internal Medicine

## 2015-11-18 ENCOUNTER — Ambulatory Visit (INDEPENDENT_AMBULATORY_CARE_PROVIDER_SITE_OTHER): Payer: Medicare Other | Admitting: Pharmacist

## 2015-11-18 DIAGNOSIS — Z5181 Encounter for therapeutic drug level monitoring: Secondary | ICD-10-CM

## 2015-11-18 DIAGNOSIS — I4891 Unspecified atrial fibrillation: Secondary | ICD-10-CM

## 2015-11-18 LAB — POCT INR: INR: 2

## 2015-11-22 ENCOUNTER — Telehealth: Payer: Self-pay

## 2015-11-22 NOTE — Telephone Encounter (Signed)
Received order via fax.  Order description:  INR 2.0.  Take 3 tabs today then resume regular schedule of 3 tabs on Tuesday and Friday and 2 tabs all other days.  Recheck INR via fingerstick or ventipucture on Sept. 25.    Form numbered and placed in Dr. Ethel Rana red folder for review and signature.

## 2015-11-25 ENCOUNTER — Ambulatory Visit (INDEPENDENT_AMBULATORY_CARE_PROVIDER_SITE_OTHER): Payer: Medicare Other | Admitting: Cardiology

## 2015-11-25 DIAGNOSIS — Z5181 Encounter for therapeutic drug level monitoring: Secondary | ICD-10-CM

## 2015-11-25 LAB — POCT INR: INR: 3.1

## 2015-11-27 ENCOUNTER — Telehealth: Payer: Self-pay | Admitting: Internal Medicine

## 2015-11-27 NOTE — Telephone Encounter (Signed)
[place] - PT for [facility] - [phone]   She called in to receive verbal orders to continue home health visits with pt.

## 2015-11-27 NOTE — Telephone Encounter (Signed)
LMOM for [place] w/ verbal orders to continue home health.

## 2015-11-29 ENCOUNTER — Ambulatory Visit (INDEPENDENT_AMBULATORY_CARE_PROVIDER_SITE_OTHER): Payer: Medicare Other | Admitting: Internal Medicine

## 2015-11-29 ENCOUNTER — Encounter: Payer: Self-pay | Admitting: Internal Medicine

## 2015-11-29 VITALS — BP 118/68 | HR 68 | Temp 97.9°F | Resp 14 | Ht 69.0 in | Wt 200.5 lb

## 2015-11-29 DIAGNOSIS — G40909 Epilepsy, unspecified, not intractable, without status epilepticus: Secondary | ICD-10-CM | POA: Diagnosis not present

## 2015-11-29 DIAGNOSIS — N183 Chronic kidney disease, stage 3 unspecified: Secondary | ICD-10-CM

## 2015-11-29 DIAGNOSIS — S82401S Unspecified fracture of shaft of right fibula, sequela: Secondary | ICD-10-CM

## 2015-11-29 DIAGNOSIS — I480 Paroxysmal atrial fibrillation: Secondary | ICD-10-CM | POA: Diagnosis not present

## 2015-11-29 DIAGNOSIS — S82402S Unspecified fracture of shaft of left fibula, sequela: Secondary | ICD-10-CM

## 2015-11-29 DIAGNOSIS — E1159 Type 2 diabetes mellitus with other circulatory complications: Secondary | ICD-10-CM

## 2015-11-29 DIAGNOSIS — D649 Anemia, unspecified: Secondary | ICD-10-CM | POA: Diagnosis not present

## 2015-11-29 DIAGNOSIS — I255 Ischemic cardiomyopathy: Secondary | ICD-10-CM | POA: Diagnosis not present

## 2015-11-29 DIAGNOSIS — Z794 Long term (current) use of insulin: Secondary | ICD-10-CM

## 2015-11-29 DIAGNOSIS — Z23 Encounter for immunization: Secondary | ICD-10-CM | POA: Diagnosis not present

## 2015-11-29 DIAGNOSIS — E785 Hyperlipidemia, unspecified: Secondary | ICD-10-CM

## 2015-11-29 DIAGNOSIS — I10 Essential (primary) hypertension: Secondary | ICD-10-CM

## 2015-11-29 DIAGNOSIS — E1122 Type 2 diabetes mellitus with diabetic chronic kidney disease: Secondary | ICD-10-CM

## 2015-11-29 LAB — CBC WITH DIFFERENTIAL/PLATELET
BASOS ABS: 0 10*3/uL (ref 0.0–0.1)
BASOS PCT: 0.7 % (ref 0.0–3.0)
EOS ABS: 0.3 10*3/uL (ref 0.0–0.7)
Eosinophils Relative: 3.7 % (ref 0.0–5.0)
HCT: 30.1 % — ABNORMAL LOW (ref 36.0–46.0)
HEMOGLOBIN: 10.1 g/dL — AB (ref 12.0–15.0)
LYMPHS PCT: 27 % (ref 12.0–46.0)
Lymphs Abs: 1.9 10*3/uL (ref 0.7–4.0)
MCHC: 33.6 g/dL (ref 30.0–36.0)
MCV: 90.3 fl (ref 78.0–100.0)
MONO ABS: 0.6 10*3/uL (ref 0.1–1.0)
Monocytes Relative: 9.1 % (ref 3.0–12.0)
Neutro Abs: 4.2 10*3/uL (ref 1.4–7.7)
Neutrophils Relative %: 59.5 % (ref 43.0–77.0)
Platelets: 329 10*3/uL (ref 150.0–400.0)
RBC: 3.33 Mil/uL — ABNORMAL LOW (ref 3.87–5.11)
RDW: 15.1 % (ref 11.5–15.5)
WBC: 7 10*3/uL (ref 4.0–10.5)

## 2015-11-29 LAB — LIPID PANEL
CHOL/HDL RATIO: 6
CHOLESTEROL: 260 mg/dL — AB (ref 0–200)
HDL: 45.9 mg/dL (ref >39.00)
LDL CALC: 186 mg/dL — AB (ref 0–99)
NONHDL: 213.9
Triglycerides: 142 mg/dL (ref 0.0–149.0)
VLDL: 28.4 mg/dL (ref 0.0–40.0)

## 2015-11-29 NOTE — Assessment & Plan Note (Signed)
Bilateral leg fracture: Treated conservatively, recovering, follow-up by Dr. Lorin Mercy DM: Recommend to see Dr. [name] HTN: Seems well-controlled, last BMP at baseline, continue Lasix, lisinopril, we are adding back a low dose of metoprolol. Hyperlipidemia: Check a FLP Atrial fibrillation: At some point was on metoprolol 200 mg daily, dose reduce on 08-2015 by cardiology d/t HR in the 40s. By 08-2015 she was taking 50 mg and doing well. Currently not on BBs, recommend to restart a low-dose: Metoprolol ER 50 half tablet daily, watch pulse and BPs Seizure disorder: Apparently had a seizure recently, Dilantin level was low, she was on one tablet daily, now on one tablet twice a day. Will check levels. Anemia: Recheck a CBC Primary care: Flu shot today RTC CPX, 4 months

## 2015-11-29 NOTE — Progress Notes (Signed)
Pre visit review using our clinic review tool, if applicable. No additional management support is needed unless otherwise documented below in the visit note. 

## 2015-11-29 NOTE — Progress Notes (Signed)
Subjective:    Patient ID: [name] [name], female    DOB: 1940/09/24, 75 y.o.   MRN: [id number]  DOS:  11/29/2015 Type of visit - description : Here for CPX but we decided to take care of routine medical problems. Interval history: Since the last office visit, she was admitted to the hospital 09/14/2015 with bilateral fibular fracture, question of a seizure, UTI. Was treated conservatively, Dilantin level was noted to be supratherapeutic , dose adjusted, EEG normal. UTI treated with Rocephin and cefuroxime After the admission , she went to a rehabilitation facility for 2 weeks and then back home. She is recovering. Has seen ortho  for fracture follow-up, has seen urology. Labs reviewed.   Review of Systems Prior to the recent admission, she did have generalized shaking episode, she was taking only one Dilantin tablet  No further seizures spells. Med list reviewed, not taking metoprolol. No chest pain or difficulty breathing No headaches   Past Medical History:  Diagnosis Date  . Atrial fibrillation ([place])    SVT dx 2007, cath 2007 mild CAD, then had an cardioversion, ablation; still on coumadin , has occ palpitation, EKG 03-2010 NSR  . Blindness of left eye   . Common migraine    ?hx of  . DIABETES MELLITUS, TYPE II 03/27/2006   dr [name]  . Eye muscle weakness    Right eye weakness after cataract surgery  . GERD (gastroesophageal reflux disease) 10/05/2011  . Headache(784.0) 05/07/2009  . 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 HYPERLIPIDEMIA 03/27/2006  . HYPERTENSION 03/27/2006  . Intertrochanteric fracture of right hip ([place]) 07/13/2012  . 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 Osteopenia 2004   Dexa 2004 showed Osteopenia, DEXA 03/2007 normal  . Osteopenia   . Other chronic cystitis with hematuria   . Recurrent urinary tract infection    Seeing Urology  . 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 Seizures ([place])   . 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[place]) 3/79/0240    Past Surgical  History:  Procedure Laterality Date  . CARDIAC CATHETERIZATION N/A 05/17/2015   Procedure: Left Heart Cath and Coronary Angiography;  Surgeon: Jettie Booze, MD;  Location: [facility];  Service: Cardiovascular;  Laterality: N/A;  . CARDIAC CATHETERIZATION N/A 05/17/2015   Procedure: Coronary Balloon Angioplasty;  Surgeon: Jettie Booze, MD;  Location: [facility];  Service: Cardiovascular;  Laterality: N/A;  . FEMUR IM NAIL Right 07/15/2012   Procedure: INTRAMEDULLARY (IM) NAIL HIP;  Surgeon: Johnny Bridge, MD;  Location: [facility];  Service: Orthopedics;  Laterality: Right;  . SHOULDER SURGERY      Social History   Social History  . Marital status: Married    Spouse name: Jenny Reichmann  . Number of children: 1  . Years of education: College   Occupational History  . retired  Retired   Social History Main Topics  . Smoking status: Former Smoker    Types: Cigarettes    Quit date: 03/02/1978  . Smokeless tobacco: Never Used  . Alcohol use No  . Drug use: No  . Sexual activity: No   Other Topics Concern  . Not on file   Social History Narrative   Patient lives at home spouse.   Moved from [place] 2004   1 son, problems w/ drugs, at home             Medication List       Accurate as of 11/29/15 11:18 AM. Always use your most  recent med list.          acetaminophen 325 MG tablet Commonly known as:  TYLENOL Take 650 mg by mouth every 6 (six) hours as needed for mild pain or moderate pain.   atorvastatin 80 MG tablet Commonly known as:  LIPITOR Take 80 mg by mouth daily.   calcium-vitamin D 500-200 MG-UNIT tablet Commonly known as:  OSCAL WITH D Take 1 tablet by mouth daily.   CENTRUM SILVER ADULT 50+ PO Take 1 tablet by mouth every morning. Reported on 08/07/2015   clopidogrel 75 MG tablet Commonly known as:  PLAVIX Take 1 tablet (75 mg total) by mouth daily.   escitalopram 10 MG tablet Commonly known as:  LEXAPRO TAKE TWO TABLETS BY MOUTH ONCE  DAILY   furosemide 40 MG tablet Commonly known as:  LASIX Take 1 tablet (40 mg total) by mouth 2 (two) times daily.   insulin NPH-regular Human (70-30) 100 UNIT/ML injection Commonly known as:  NOVOLIN 70/30 Inject 12 Units into the skin at bedtime. Inject 30 units in the morning. 14 units 2 times daily with lunch and dinnner   lisinopril 5 MG tablet Commonly known as:  PRINIVIL,ZESTRIL Take 1 tablet (5 mg total) by mouth daily.   LORazepam 0.5 MG tablet Commonly known as:  ATIVAN Take 1 tablet (0.5 mg total) by mouth 2 (two) times daily as needed for anxiety.   metoprolol succinate 50 MG 24 hr tablet Commonly known as:  TOPROL-XL Take 1 tablet by mouth in the evening. Take with or immediately following a meal.   nitroGLYCERIN 0.4 MG SL tablet Commonly known as:  NITROSTAT Place 1 tablet (0.4 mg total) under the tongue every 5 (five) minutes as needed for chest pain.   pantoprazole 40 MG tablet Commonly known as:  PROTONIX Take 1 tablet (40 mg total) by mouth daily.   phenytoin 200 MG ER capsule Commonly known as:  DILANTIN Take 1 capsule (200 mg total) by mouth 2 (two) times daily.   PROCEL Powd Take 2 scoop by mouth 3 (three) times daily. Stop date 10/26/15   promethazine 12.5 MG tablet Commonly known as:  PHENERGAN Take 1 tablet (12.5 mg total) by mouth every 6 (six) hours as needed for nausea or vomiting.   saccharomyces boulardii 250 MG capsule Commonly known as:  FLORASTOR Take 1 capsule (250 mg total) by mouth 2 (two) times daily.   warfarin 2.5 MG tablet Commonly known as:  COUMADIN Take 1 tablet (2.5 mg total) by mouth daily.          Objective:   Physical Exam BP 118/68 (BP Location: Left Arm, Patient Position: Sitting, Cuff Size: Normal)   Pulse 68   Temp 97.9 F (36.6 C) (Oral)   Resp 14   Ht 5\' 9"  (1.753 m)   Wt 200 lb 8 oz (90.9 kg)   SpO2 96%   BMI 29.61 kg/m  General:   Well developed, well nourished, sits in a wheelchair, has a boot at  the right leg. No distress.  HEENT:  Normocephalic . Face symmetric, atraumatic Lungs:  CTA B Normal respiratory effort, no intercostal retractions, no accessory muscle use. Heart: irregular,  no murmur.  No pretibial edema on the L   Skin: Not pale. Not jaundice Neurologic:  alert & oriented X3.  Speech normal  Psych--  Cognition and judgment appear intact.  Cooperative with normal attention span and concentration.  Behavior appropriate. No anxious or depressed appearing.      Assessment &  Plan:   Assessment > DM  + retinopathy, Dr [name] HTN Hyperlipidemia anxiety ativan GERD Elevated creatinine(started after admission 05-2015, had IV contrast) Anemia, normal iron 05-2015, never had a colonoscopy CV ---NSTEMI 05-2015, cath, angiplasty ---Echo, EF 40 % (05-2015) ---Atrial fibrillation, SVT -- dx 2007, cath 2007 mild CAD. S/p cardioversion, then ablation, on coumadin, last visit cards 2014  Osteopenia GU Urinary retention /UTI 05-2015, had a foley temporarily, saw urology, PVR 249 cc (high), rx timed voiding Neuro: Herpes encephalitis 2014  Seizures (after encephalitis), sz episode 08-2015 (?) Blindness, left eye Right eye weakness after cataract surgery Headaches Osteopenia per DEXA 2004, DEXA 2009 normal H/o lung bx 2008 benign   PLAN: Bilateral leg fracture: Treated conservatively, recovering, follow-up by Dr. Lorin Mercy DM: Recommend to see Dr. [name] HTN: Seems well-controlled, last BMP at baseline, continue Lasix, lisinopril, we are adding back a low dose of metoprolol. Hyperlipidemia: Check a FLP Atrial fibrillation: At some point was on metoprolol 200 mg daily, dose reduce on 08-2015 by cardiology d/t HR in the 40s. By 08-2015 she was taking 50 mg and doing well. Currently not on BBs, recommend to restart a low-dose: Metoprolol ER 50 half tablet daily, watch pulse and BPs Seizure disorder: Apparently had a seizure recently, Dilantin level was low, she was on one tablet  daily, now on one tablet twice a day. Will check levels. Anemia: Recheck a CBC Primary care: Flu shot today RTC CPX, 4 months

## 2015-11-29 NOTE — Patient Instructions (Signed)
GO TO THE LAB : Get the blood work    GO TO THE FRONT DESK Schedule your next appointment for a  complete physical exam in 4 m   Go back on Toprol-XL 50 mg: Only half tablet daily  Call if your pulse drops below 55  Check the  blood pressure 2 or 3 times a   Week   Be sure your blood pressure is between 110/65 and  145/85.  if it is consistently higher or lower, let me know

## 2015-11-29 NOTE — Telephone Encounter (Signed)
Per PCP, cardio manages Coumadin, they will need to sign orders for INR.

## 2015-11-30 LAB — PHENYTOIN LEVEL, TOTAL: Phenytoin Lvl: 3 ug/mL — ABNORMAL LOW (ref 10.0–20.0)

## 2015-12-01 ENCOUNTER — Inpatient Hospital Stay (HOSPITAL_COMMUNITY): Payer: Medicare Other

## 2015-12-01 ENCOUNTER — Encounter (HOSPITAL_COMMUNITY): Payer: Self-pay | Admitting: Emergency Medicine

## 2015-12-01 ENCOUNTER — Emergency Department (HOSPITAL_COMMUNITY): Payer: Medicare Other

## 2015-12-01 ENCOUNTER — Inpatient Hospital Stay (HOSPITAL_COMMUNITY)
Admission: EM | Admit: 2015-12-01 | Discharge: 2015-12-05 | DRG: 481 | Disposition: A | Discharge status: Home / Self Care | Payer: Medicare Other | Attending: Internal Medicine | Admitting: Internal Medicine

## 2015-12-01 DIAGNOSIS — Y838 Other surgical procedures as the cause of abnormal reaction of the patient, or of later complication, without mention of misadventure at the time of the procedure: Secondary | ICD-10-CM | POA: Diagnosis not present

## 2015-12-01 DIAGNOSIS — D649 Anemia, unspecified: Secondary | ICD-10-CM | POA: Diagnosis not present

## 2015-12-01 DIAGNOSIS — I130 Hypertensive heart and chronic kidney disease with heart failure and stage 1 through stage 4 chronic kidney disease, or unspecified chronic kidney disease: Secondary | ICD-10-CM | POA: Diagnosis present

## 2015-12-01 DIAGNOSIS — Z7984 Long term (current) use of oral hypoglycemic drugs: Secondary | ICD-10-CM

## 2015-12-01 DIAGNOSIS — Z9181 History of falling: Secondary | ICD-10-CM | POA: Diagnosis not present

## 2015-12-01 DIAGNOSIS — Z87891 Personal history of nicotine dependence: Secondary | ICD-10-CM

## 2015-12-01 DIAGNOSIS — G40909 Epilepsy, unspecified, not intractable, without status epilepticus: Secondary | ICD-10-CM | POA: Diagnosis not present

## 2015-12-01 DIAGNOSIS — R110 Nausea: Secondary | ICD-10-CM

## 2015-12-01 DIAGNOSIS — B9620 Unspecified Escherichia coli [E. coli] as the cause of diseases classified elsewhere: Secondary | ICD-10-CM | POA: Diagnosis present

## 2015-12-01 DIAGNOSIS — T814XXA Infection following a procedure, initial encounter: Secondary | ICD-10-CM | POA: Diagnosis not present

## 2015-12-01 DIAGNOSIS — S72009A Fracture of unspecified part of neck of unspecified femur, initial encounter for closed fracture: Secondary | ICD-10-CM | POA: Diagnosis present

## 2015-12-01 DIAGNOSIS — Z794 Long term (current) use of insulin: Secondary | ICD-10-CM

## 2015-12-01 DIAGNOSIS — N183 Chronic kidney disease, stage 3 (moderate): Secondary | ICD-10-CM | POA: Diagnosis present

## 2015-12-01 DIAGNOSIS — Z7902 Long term (current) use of antithrombotics/antiplatelets: Secondary | ICD-10-CM

## 2015-12-01 DIAGNOSIS — S72002D Fracture of unspecified part of neck of left femur, subsequent encounter for closed fracture with routine healing: Secondary | ICD-10-CM | POA: Diagnosis not present

## 2015-12-01 DIAGNOSIS — K219 Gastro-esophageal reflux disease without esophagitis: Secondary | ICD-10-CM | POA: Diagnosis present

## 2015-12-01 DIAGNOSIS — S82842D Displaced bimalleolar fracture of left lower leg, subsequent encounter for closed fracture with routine healing: Secondary | ICD-10-CM

## 2015-12-01 DIAGNOSIS — E11319 Type 2 diabetes mellitus with unspecified diabetic retinopathy without macular edema: Secondary | ICD-10-CM | POA: Diagnosis present

## 2015-12-01 DIAGNOSIS — N179 Acute kidney failure, unspecified: Secondary | ICD-10-CM | POA: Diagnosis not present

## 2015-12-01 DIAGNOSIS — W010XXA Fall on same level from slipping, tripping and stumbling without subsequent striking against object, initial encounter: Secondary | ICD-10-CM | POA: Diagnosis present

## 2015-12-01 DIAGNOSIS — F329 Major depressive disorder, single episode, unspecified: Secondary | ICD-10-CM | POA: Diagnosis not present

## 2015-12-01 DIAGNOSIS — Y792 Prosthetic and other implants, materials and accessory orthopedic devices associated with adverse incidents: Secondary | ICD-10-CM | POA: Diagnosis not present

## 2015-12-01 DIAGNOSIS — E119 Type 2 diabetes mellitus without complications: Secondary | ICD-10-CM

## 2015-12-01 DIAGNOSIS — I480 Paroxysmal atrial fibrillation: Secondary | ICD-10-CM | POA: Diagnosis not present

## 2015-12-01 DIAGNOSIS — Z7901 Long term (current) use of anticoagulants: Secondary | ICD-10-CM

## 2015-12-01 DIAGNOSIS — Z833 Family history of diabetes mellitus: Secondary | ICD-10-CM

## 2015-12-01 DIAGNOSIS — L03116 Cellulitis of left lower limb: Secondary | ICD-10-CM | POA: Diagnosis not present

## 2015-12-01 DIAGNOSIS — N3021 Other chronic cystitis with hematuria: Secondary | ICD-10-CM | POA: Diagnosis present

## 2015-12-01 DIAGNOSIS — I4891 Unspecified atrial fibrillation: Secondary | ICD-10-CM | POA: Diagnosis present

## 2015-12-01 DIAGNOSIS — Z419 Encounter for procedure for purposes other than remedying health state, unspecified: Secondary | ICD-10-CM

## 2015-12-01 DIAGNOSIS — D72829 Elevated white blood cell count, unspecified: Secondary | ICD-10-CM | POA: Diagnosis present

## 2015-12-01 DIAGNOSIS — Z79899 Other long term (current) drug therapy: Secondary | ICD-10-CM

## 2015-12-01 DIAGNOSIS — N189 Chronic kidney disease, unspecified: Secondary | ICD-10-CM

## 2015-12-01 DIAGNOSIS — R791 Abnormal coagulation profile: Secondary | ICD-10-CM | POA: Diagnosis present

## 2015-12-01 DIAGNOSIS — B964 Proteus (mirabilis) (morganii) as the cause of diseases classified elsewhere: Secondary | ICD-10-CM | POA: Diagnosis not present

## 2015-12-01 DIAGNOSIS — F419 Anxiety disorder, unspecified: Secondary | ICD-10-CM | POA: Diagnosis present

## 2015-12-01 DIAGNOSIS — Z6831 Body mass index (BMI) 31.0-31.9, adult: Secondary | ICD-10-CM

## 2015-12-01 DIAGNOSIS — H5462 Unqualified visual loss, left eye, normal vision right eye: Secondary | ICD-10-CM | POA: Diagnosis present

## 2015-12-01 DIAGNOSIS — E1122 Type 2 diabetes mellitus with diabetic chronic kidney disease: Secondary | ICD-10-CM | POA: Diagnosis not present

## 2015-12-01 DIAGNOSIS — B999 Unspecified infectious disease: Secondary | ICD-10-CM | POA: Diagnosis not present

## 2015-12-01 DIAGNOSIS — I10 Essential (primary) hypertension: Secondary | ICD-10-CM | POA: Diagnosis present

## 2015-12-01 DIAGNOSIS — Z8249 Family history of ischemic heart disease and other diseases of the circulatory system: Secondary | ICD-10-CM

## 2015-12-01 DIAGNOSIS — T8469XD Infection and inflammatory reaction due to internal fixation device of other site, subsequent encounter: Secondary | ICD-10-CM | POA: Diagnosis not present

## 2015-12-01 DIAGNOSIS — R1110 Vomiting, unspecified: Secondary | ICD-10-CM

## 2015-12-01 DIAGNOSIS — T814XXD Infection following a procedure, subsequent encounter: Secondary | ICD-10-CM | POA: Diagnosis not present

## 2015-12-01 DIAGNOSIS — S82841K Displaced bimalleolar fracture of right lower leg, subsequent encounter for closed fracture with nonunion: Secondary | ICD-10-CM | POA: Diagnosis not present

## 2015-12-01 DIAGNOSIS — E785 Hyperlipidemia, unspecified: Secondary | ICD-10-CM | POA: Diagnosis not present

## 2015-12-01 DIAGNOSIS — E118 Type 2 diabetes mellitus with unspecified complications: Secondary | ICD-10-CM

## 2015-12-01 DIAGNOSIS — I2510 Atherosclerotic heart disease of native coronary artery without angina pectoris: Secondary | ICD-10-CM | POA: Diagnosis present

## 2015-12-01 DIAGNOSIS — E669 Obesity, unspecified: Secondary | ICD-10-CM | POA: Diagnosis present

## 2015-12-01 DIAGNOSIS — F418 Other specified anxiety disorders: Secondary | ICD-10-CM | POA: Diagnosis not present

## 2015-12-01 DIAGNOSIS — B9562 Methicillin resistant Staphylococcus aureus infection as the cause of diseases classified elsewhere: Secondary | ICD-10-CM | POA: Diagnosis not present

## 2015-12-01 DIAGNOSIS — B9689 Other specified bacterial agents as the cause of diseases classified elsewhere: Secondary | ICD-10-CM | POA: Diagnosis not present

## 2015-12-01 DIAGNOSIS — S72142A Displaced intertrochanteric fracture of left femur, initial encounter for closed fracture: Principal | ICD-10-CM | POA: Diagnosis present

## 2015-12-01 DIAGNOSIS — I5022 Chronic systolic (congestive) heart failure: Secondary | ICD-10-CM | POA: Diagnosis present

## 2015-12-01 DIAGNOSIS — Z9861 Coronary angioplasty status: Secondary | ICD-10-CM

## 2015-12-01 LAB — COMPREHENSIVE METABOLIC PANEL
ALBUMIN: 3.2 g/dL — AB (ref 3.5–5.0)
ALT: 10 U/L — ABNORMAL LOW (ref 14–54)
ANION GAP: 12 (ref 5–15)
AST: 21 U/L (ref 15–41)
Alkaline Phosphatase: 184 U/L — ABNORMAL HIGH (ref 38–126)
BUN: 20 mg/dL (ref 6–20)
CHLORIDE: 96 mmol/L — AB (ref 101–111)
CO2: 28 mmol/L (ref 22–32)
Calcium: 9 mg/dL (ref 8.9–10.3)
Creatinine, Ser: 1.87 mg/dL — ABNORMAL HIGH (ref 0.44–1.00)
GFR calc Af Amer: 30 mL/min — ABNORMAL LOW (ref >60)
GFR calc non Af Amer: 26 mL/min — ABNORMAL LOW (ref >60)
GLUCOSE: 296 mg/dL — AB (ref 65–99)
POTASSIUM: 4.2 mmol/L (ref 3.5–5.1)
SODIUM: 136 mmol/L (ref 135–145)
TOTAL PROTEIN: 7.2 g/dL (ref 6.5–8.1)
Total Bilirubin: 0.5 mg/dL (ref 0.3–1.2)

## 2015-12-01 LAB — CBC WITH DIFFERENTIAL/PLATELET
BASOS ABS: 0 10*3/uL (ref 0.0–0.1)
Basophils Relative: 0 %
EOS ABS: 0 10*3/uL (ref 0.0–0.7)
EOS PCT: 0 %
HCT: 33.6 % — ABNORMAL LOW (ref 36.0–46.0)
Hemoglobin: 10.8 g/dL — ABNORMAL LOW (ref 12.0–15.0)
LYMPHS ABS: 1.4 10*3/uL (ref 0.7–4.0)
Lymphocytes Relative: 7 %
MCH: 30 pg (ref 26.0–34.0)
MCHC: 32.1 g/dL (ref 30.0–36.0)
MCV: 93.3 fL (ref 78.0–100.0)
MONO ABS: 2.5 10*3/uL — AB (ref 0.1–1.0)
Monocytes Relative: 13 %
Neutro Abs: 15.2 10*3/uL — ABNORMAL HIGH (ref 1.7–7.7)
Neutrophils Relative %: 80 %
PLATELETS: 341 10*3/uL (ref 150–400)
RBC: 3.6 MIL/uL — AB (ref 3.87–5.11)
RDW: 13.8 % (ref 11.5–15.5)
WBC: 19.2 10*3/uL — AB (ref 4.0–10.5)

## 2015-12-01 LAB — GLUCOSE, CAPILLARY: Glucose-Capillary: 379 mg/dL — ABNORMAL HIGH (ref 65–99)

## 2015-12-01 LAB — URINALYSIS, ROUTINE W REFLEX MICROSCOPIC
BILIRUBIN URINE: NEGATIVE
GLUCOSE, UA: NEGATIVE mg/dL
Ketones, ur: NEGATIVE mg/dL
Nitrite: NEGATIVE
PH: 5.5 (ref 5.0–8.0)
Protein, ur: NEGATIVE mg/dL
SPECIFIC GRAVITY, URINE: 1.01 (ref 1.005–1.030)

## 2015-12-01 LAB — PROTIME-INR
INR: 3.41
INR: 3.73
Prothrombin Time: 35.2 seconds — ABNORMAL HIGH (ref 11.4–15.2)
Prothrombin Time: 37.8 seconds — ABNORMAL HIGH (ref 11.4–15.2)

## 2015-12-01 LAB — CBG MONITORING, ED: GLUCOSE-CAPILLARY: 268 mg/dL — AB (ref 65–99)

## 2015-12-01 LAB — URINE MICROSCOPIC-ADD ON

## 2015-12-01 LAB — SURGICAL PCR SCREEN
MRSA, PCR: POSITIVE — AB
STAPHYLOCOCCUS AUREUS: POSITIVE — AB

## 2015-12-01 LAB — TYPE AND SCREEN
ABO/RH(D): A NEG
ANTIBODY SCREEN: NEGATIVE

## 2015-12-01 LAB — CREATININE, SERUM
CREATININE: 1.72 mg/dL — AB (ref 0.44–1.00)
GFR calc Af Amer: 33 mL/min — ABNORMAL LOW (ref >60)
GFR calc non Af Amer: 28 mL/min — ABNORMAL LOW (ref >60)

## 2015-12-01 LAB — LIPASE, BLOOD: Lipase: 15 U/L (ref 11–51)

## 2015-12-01 MED ORDER — ZOLPIDEM TARTRATE 5 MG PO TABS
5.0000 mg | ORAL_TABLET | Freq: Every evening | ORAL | Status: DC | PRN
  Filled 2015-12-01: qty 1

## 2015-12-01 MED ORDER — MUPIROCIN 2 % EX OINT
1.0000 "application " | TOPICAL_OINTMENT | Freq: Two times a day (BID) | CUTANEOUS | Status: DC
  Administered 2015-12-01 – 2015-12-05 (×8): 1 via NASAL
  Filled 2015-12-01: qty 22

## 2015-12-01 MED ORDER — SODIUM CHLORIDE 0.9 % IV BOLUS (SEPSIS)
1000.0000 mL | Freq: Once | INTRAVENOUS | Status: AC
  Administered 2015-12-01: 1000 mL via INTRAVENOUS

## 2015-12-01 MED ORDER — LISINOPRIL 10 MG PO TABS: 5.0000 mg | ORAL_TABLET | Freq: Every day | ORAL | Status: DC

## 2015-12-01 MED ORDER — METHOCARBAMOL 500 MG PO TABS: 500.0000 mg | ORAL_TABLET | Freq: Four times a day (QID) | ORAL | Status: DC | PRN

## 2015-12-01 MED ORDER — ESCITALOPRAM OXALATE 10 MG PO TABS: 20.0000 mg | ORAL_TABLET | Freq: Every day | ORAL | Status: DC

## 2015-12-01 MED ORDER — DEXTROSE 5 % IV SOLN
500.0000 mg | Freq: Four times a day (QID) | INTRAVENOUS | Status: DC | PRN
  Filled 2015-12-01: qty 5

## 2015-12-01 MED ORDER — HYDROMORPHONE HCL 1 MG/ML IJ SOLN
0.5000 mg | INTRAMUSCULAR | Status: DC | PRN
  Administered 2015-12-01: 0.5 mg via INTRAVENOUS
  Filled 2015-12-01: qty 1

## 2015-12-01 MED ORDER — HYDROCODONE-ACETAMINOPHEN 5-325 MG PO TABS
1.0000 | ORAL_TABLET | Freq: Four times a day (QID) | ORAL | Status: DC | PRN
  Administered 2015-12-01: 1 via ORAL
  Filled 2015-12-01: qty 1

## 2015-12-01 MED ORDER — LORAZEPAM 0.5 MG PO TABS
0.5000 mg | ORAL_TABLET | Freq: Two times a day (BID) | ORAL | Status: DC | PRN
  Filled 2015-12-01: qty 1

## 2015-12-01 MED ORDER — PHENYTOIN SODIUM 50 MG/ML IJ SOLN
500.0000 mg | Freq: Once | INTRAMUSCULAR | Status: AC
  Administered 2015-12-01: 500 mg via INTRAVENOUS
  Filled 2015-12-01: qty 10

## 2015-12-01 MED ORDER — INSULIN ASPART 100 UNIT/ML ~~LOC~~ SOLN
0.0000 [IU] | Freq: Three times a day (TID) | SUBCUTANEOUS | Status: DC
  Administered 2015-12-02 (×2): 5 [IU] via SUBCUTANEOUS
  Administered 2015-12-02: 11 [IU] via SUBCUTANEOUS
  Administered 2015-12-03 (×2): 8 [IU] via SUBCUTANEOUS
  Administered 2015-12-03: 11 [IU] via SUBCUTANEOUS
  Administered 2015-12-04: 8 [IU] via SUBCUTANEOUS
  Administered 2015-12-04 (×2): 5 [IU] via SUBCUTANEOUS
  Administered 2015-12-05: 2 [IU] via SUBCUTANEOUS
  Administered 2015-12-05: 5 [IU] via SUBCUTANEOUS

## 2015-12-01 MED ORDER — ESCITALOPRAM OXALATE 10 MG PO TABS
20.0000 mg | ORAL_TABLET | Freq: Every day | ORAL | Status: DC
  Administered 2015-12-03 – 2015-12-05 (×4): 20 mg via ORAL
  Filled 2015-12-01 (×4): qty 2

## 2015-12-01 MED ORDER — FUROSEMIDE 20 MG PO TABS: 40.0000 mg | ORAL_TABLET | Freq: Two times a day (BID) | ORAL | Status: DC

## 2015-12-01 MED ORDER — DOCUSATE SODIUM 100 MG PO CAPS
100.0000 mg | ORAL_CAPSULE | Freq: Two times a day (BID) | ORAL | Status: DC
  Administered 2015-12-01: 100 mg via ORAL
  Filled 2015-12-01: qty 1

## 2015-12-01 MED ORDER — MORPHINE SULFATE (PF) 2 MG/ML IV SOLN
0.5000 mg | INTRAVENOUS | Status: DC | PRN
  Administered 2015-12-03: 0.5 mg via INTRAVENOUS
  Filled 2015-12-01: qty 1

## 2015-12-01 MED ORDER — METOPROLOL SUCCINATE ER 25 MG PO TB24: 25.0000 mg | ORAL_TABLET | Freq: Every day | ORAL | Status: DC

## 2015-12-01 MED ORDER — HEPARIN SODIUM (PORCINE) 5000 UNIT/ML IJ SOLN: 5000.0000 [IU] | Freq: Three times a day (TID) | INTRAMUSCULAR | Status: DC

## 2015-12-01 MED ORDER — INSULIN ASPART 100 UNIT/ML ~~LOC~~ SOLN
0.0000 [IU] | Freq: Every day | SUBCUTANEOUS | Status: DC
  Administered 2015-12-01: 5 [IU] via SUBCUTANEOUS

## 2015-12-01 MED ORDER — INSULIN ASPART 100 UNIT/ML ~~LOC~~ SOLN
4.0000 [IU] | Freq: Once | SUBCUTANEOUS | Status: AC
  Administered 2015-12-01: 4 [IU] via SUBCUTANEOUS
  Filled 2015-12-01: qty 1

## 2015-12-01 MED ORDER — METOPROLOL SUCCINATE ER 25 MG PO TB24
25.0000 mg | ORAL_TABLET | Freq: Every day | ORAL | Status: DC
  Administered 2015-12-02 – 2015-12-05 (×5): 25 mg via ORAL
  Filled 2015-12-01 (×5): qty 1

## 2015-12-01 MED ORDER — SODIUM CHLORIDE 0.9 % IV SOLN
1000.0000 mg | Freq: Once | INTRAVENOUS | Status: DC
  Filled 2015-12-01: qty 20

## 2015-12-01 MED ORDER — SODIUM CHLORIDE 0.9 % IV SOLN
INTRAVENOUS | Status: DC
  Administered 2015-12-01: 999 mL via INTRAVENOUS

## 2015-12-01 MED ORDER — ONDANSETRON HCL 4 MG/2ML IJ SOLN
4.0000 mg | Freq: Once | INTRAMUSCULAR | Status: AC
  Administered 2015-12-01: 4 mg via INTRAVENOUS
  Filled 2015-12-01: qty 2

## 2015-12-01 MED ORDER — SODIUM CHLORIDE 0.9 % IV SOLN
INTRAVENOUS | Status: DC
  Administered 2015-12-01: 17:00:00 via INTRAVENOUS

## 2015-12-01 MED ORDER — PHENYTOIN SODIUM EXTENDED 100 MG PO CAPS: 200.0000 mg | ORAL_CAPSULE | Freq: Two times a day (BID) | ORAL | Status: DC

## 2015-12-01 MED ORDER — CHLORHEXIDINE GLUCONATE CLOTH 2 % EX PADS
6.0000 | MEDICATED_PAD | Freq: Every day | CUTANEOUS | Status: DC
  Administered 2015-12-02 – 2015-12-05 (×3): 6 via TOPICAL

## 2015-12-01 MED ORDER — PHENYTOIN SODIUM EXTENDED 100 MG PO CAPS
200.0000 mg | ORAL_CAPSULE | Freq: Two times a day (BID) | ORAL | Status: DC
  Administered 2015-12-01 – 2015-12-05 (×8): 200 mg via ORAL
  Filled 2015-12-01 (×8): qty 2

## 2015-12-01 NOTE — Progress Notes (Signed)
Dr. Aggie Moats indicated foley insertion could be discontinued, patient is able to use bedpan with minimal discomfort.  Will continue to monitor.

## 2015-12-01 NOTE — ED Triage Notes (Addendum)
Pt in via [facility] after tripping at home, falling onto L hip. Takes Plavix and Coumadin. Denies hitting head or any other extremity, no LOC. Hx of CVA, DM and R & L leg fx's since July. Pt currently in R walker boot. Given 62mcg Fentanyl IV for EMS. C/o 5/10 hip pain on arrival. +sensation +mvmt in LLE

## 2015-12-01 NOTE — Progress Notes (Signed)
Patient ID: [name] [name], female   DOB: 11-Aug-1940, 75 y.o.   MRN: [id number] [name] [name] had an unfortunate mechanical fall earlier today injuring her left hip.  She has a left intertrochanteric hip fracture and surgery is being recommended.  She understands this fully and her husband, who is at the bedside, does as well.  Her INR in >3, so will delay surgery until tomorrow late afternoon.  She is currently an active patient of my partner Dr. Lorin Mercy.  He will see her in the morning and perform the surgery.  She will need Vitamin K today and likely tomorrow to get her INR down.

## 2015-12-01 NOTE — ED Notes (Signed)
Returned from [facility].  Pt reports pain was improved until she was moved for films.

## 2015-12-01 NOTE — ED Notes (Signed)
Patient transported to X-ray 

## 2015-12-01 NOTE — H&P (Signed)
History and Physical    [place] KDX:[id number] DOB: [birth year] DOA: 12/01/2015  PCP: [name], MD Patient coming from: home  Chief Complaint: left hip pain/fall  HPI: [name] [name] is a pleasant 75 y.o. female with medical history significant for A. fib on Coumadin CAD Plavix, CHF, hypertension, diabetes, seizure disorder, recent UTI resulting in fall resulting in bilateral fibular fractures since emergency Department chief complaint of left hip pain after a fall. Initial evaluation reveals left hip fracture, acute on chronic kidney injury.  Information is obtained from the chart and the patient and she was in her usual state of health until this morning when she awakened ambulated to the bathroom with her blue boot on her right foot (she alternates legs in wearing blue boot) and she states "my left leg just gave way" and fell. She denies losing consciousness or hitting her head. Her husband came and tried to assist her up with it was too painful. EMS was called. Associated symptoms included mild lightheadedness recent vomiting. She reports she saw her PCP yesterday and obtain the flu shot and feels like that made her nauseous she had one episode of emesis. She denies coffee ground emesis. She denies any chest pain palpitation, shortness of breath, headache syncope or near-syncope. She denies any fever chills cough recent travel or sick contacts.. She denies dysuria hematuria frequency or urgency.    ED Course: The emergency department she's afebrile hemodynamically stable. She received Dilaudid for pain and her oxygen saturation level dropped 87% briefly. She is not hypoxic. She is provided with 1 L of saline in the maintenance fluids: 125 an hour.  Review of Systems: As per HPI otherwise 10 point review of systems negative.   Ambulatory Status: Ambulates wearing blue boot alternately was unsteady gait.  Past Medical History:  Diagnosis Date  . Atrial fibrillation ([place])    SVT  dx 2007, cath 2007 mild CAD, then had an cardioversion, ablation; still on coumadin , has occ palpitation, EKG 03-2010 NSR  . Blindness of left eye   . Common migraine    ?hx of  . DIABETES MELLITUS, TYPE II 03/27/2006   dr [name]  . Eye muscle weakness    Right eye weakness after cataract surgery  . GERD (gastroesophageal reflux disease) 10/05/2011  . Headache(784.0) 05/07/2009  . 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 HYPERLIPIDEMIA 03/27/2006  . HYPERTENSION 03/27/2006  . Intertrochanteric fracture of right hip ([place]) 07/13/2012  . 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 Osteopenia 2004   Dexa 2004 showed Osteopenia, DEXA 03/2007 normal  . Osteopenia   . Other chronic cystitis with hematuria   . Recurrent urinary tract infection    Seeing Urology  . 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 Seizures ([place])   . 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[place]) 9/76/7341    Past Surgical History:  Procedure Laterality Date  . CARDIAC CATHETERIZATION N/A 05/17/2015   Procedure: Left Heart Cath and Coronary Angiography;  Surgeon: Jettie Booze, MD;  Location: [facility];  Service: Cardiovascular;  Laterality: N/A;  . CARDIAC CATHETERIZATION N/A 05/17/2015   Procedure: Coronary Balloon Angioplasty;  Surgeon: Jettie Booze, MD;  Location: [facility];  Service: Cardiovascular;  Laterality: N/A;  . FEMUR IM NAIL Right 07/15/2012   Procedure: INTRAMEDULLARY (IM) NAIL HIP;  Surgeon: Johnny Bridge, MD;  Location: [facility];  Service: Orthopedics;  Laterality: Right;  . SHOULDER SURGERY      Social History   Social History  .  Marital status: Married    Spouse name: Jenny Reichmann  . Number of children: 1  . Years of education: College   Occupational History  . retired  Retired   Social History Main Topics  . Smoking status: Former Smoker    Types: Cigarettes    Quit date: 03/02/1978  . Smokeless tobacco: Never Used  . Alcohol use No  . Drug use: No  . Sexual activity: No   Other Topics Concern  . Not  on file   Social History Narrative   Patient lives at home spouse.   Moved from [place] 2004   1 son, problems w/ drugs, at home         Allergies  Allergen Reactions  . Procaine Hcl Anaphylaxis    Family History  Problem Relation Age of Onset  . Diabetes Father   . Heart attack Father 51  . Diabetes Sister   . Cancer Neg Hx     no hx of colon or breast cancer    Prior to Admission medications   Medication Sig Start Date End Date Taking? Authorizing Provider  acetaminophen (TYLENOL) 325 MG tablet Take 650 mg by mouth every 6 (six) hours as needed for mild pain or moderate pain.    Historical Provider, MD  atorvastatin (LIPITOR) 80 MG tablet Take 80 mg by mouth daily.    Historical Provider, MD  calcium-vitamin D (OSCAL WITH D) 500-200 MG-UNIT per tablet Take 1 tablet by mouth daily.    Historical Provider, MD  clopidogrel (PLAVIX) 75 MG tablet Take 1 tablet (75 mg total) by mouth daily. 05/21/15   Ripudeep Krystal Eaton, MD  escitalopram (LEXAPRO) 10 MG tablet TAKE TWO TABLETS BY MOUTH ONCE DAILY 07/09/15   Garvin Fila, MD  furosemide (LASIX) 40 MG tablet Take 1 tablet (40 mg total) by mouth 2 (two) times daily. 05/21/15   Ripudeep Krystal Eaton, MD  insulin NPH-regular Human (NOVOLIN 70/30) (70-30) 100 UNIT/ML injection Inject 12 Units into the skin at bedtime. Inject 30 units in the morning. 14 units 2 times daily with lunch and dinnner    Historical Provider, MD  lisinopril (PRINIVIL,ZESTRIL) 5 MG tablet Take 1 tablet (5 mg total) by mouth daily. 06/07/15   Skeet Latch, MD  LORazepam (ATIVAN) 0.5 MG tablet Take 1 tablet (0.5 mg total) by mouth 2 (two) times daily as needed for anxiety. 10/09/15   Colon Branch, MD  metoprolol succinate (TOPROL-XL) 50 MG 24 hr tablet Take 25 mg by mouth daily. Take with or immediately following a meal.    Historical Provider, MD  Multiple Vitamins-Minerals (CENTRUM SILVER ADULT 50+ PO) Take 1 tablet by mouth every morning. Reported on 08/07/2015    Historical Provider, MD   pantoprazole (PROTONIX) 40 MG tablet Take 1 tablet (40 mg total) by mouth daily. 08/07/15   Colon Branch, MD  phenytoin (DILANTIN) 200 MG ER capsule Take 1 capsule (200 mg total) by mouth 2 (two) times daily. 09/18/15   Orson Eva, MD  promethazine (PHENERGAN) 12.5 MG tablet Take 1 tablet (12.5 mg total) by mouth every 6 (six) hours as needed for nausea or vomiting. 11/13/15   Colon Branch, MD  Protein (PROCEL) POWD Take 2 scoop by mouth 3 (three) times daily. Stop date 10/26/15    Historical Provider, MD  saccharomyces boulardii (FLORASTOR) 250 MG capsule Take 1 capsule (250 mg total) by mouth 2 (two) times daily. 05/11/15   Nishant Dhungel, MD  warfarin (COUMADIN) 2.5 MG tablet Take  1 tablet (2.5 mg total) by mouth daily. 10/10/15   Evans Lance, MD    Physical Exam: Vitals:   12/01/15 1317 12/01/15 1324 12/01/15 1400 12/01/15 1500  BP:  156/63 (!) 130/47 143/56  Pulse:   80 79  Resp:  (!) 27 15 16   SpO2:  100% (!) 82% 99%  Weight: 90.7 kg (200 lb)     Height: 5\' 9"  (1.753 m)        General:  Appears calm and comfortable, no acute distress Eyes:  PERRL, EOMI, normal lids, iris ENT:  grossly normal hearing, lips & tongue, mucous membranes of her mouth are pink slightly dry Neck:  no LAD, masses or thyromegaly Cardiovascular:  RRR, no m/r/g. No LE edema.  Respiratory:  CTA bilaterally, no w/r/r. Normal respiratory effort. Abdomen:  soft, ntnd, obese positive bowel sounds throughout but sluggish no guarding or rebounding Skin:  no rash or induration seen on limited exam Musculoskeletal:  grossly normal tone BUE/BLE, good ROM, no bony abnormality, left leg externally rotated slightly shorter pedal pulses present left hip tender to touch. Psychiatric:  grossly normal mood and affect, speech fluent and appropriate, AOx3 Neurologic:  CN 2-12 grossly intact, moves all extremities in coordinated fashion, sensation intact  Labs on Admission: I have personally reviewed following labs and imaging  studies  CBC:  Recent Labs Lab 11/29/15 1146 12/01/15 1343  WBC 7.0 19.2*  NEUTROABS 4.2 15.2*  HGB 10.1* 10.8*  HCT 30.1* 33.6*  MCV 90.3 93.3  PLT 329.0 563   Basic Metabolic Panel:  Recent Labs Lab 12/01/15 1343  NA 136  K 4.2  CL 96*  CO2 28  GLUCOSE 296*  BUN 20  CREATININE 1.87*  CALCIUM 9.0   GFR: Estimated Creatinine Clearance: 32.1 mL/min (by C-G formula based on SCr of 1.87 mg/dL (H)). Liver Function Tests:  Recent Labs Lab 12/01/15 1343  AST 21  ALT 10*  ALKPHOS 184*  BILITOT 0.5  PROT 7.2  ALBUMIN 3.2*    Recent Labs Lab 12/01/15 1343  LIPASE 15   No results for input(s): AMMONIA in the last 168 hours. Coagulation Profile:  Recent Labs Lab 11/25/15 12/01/15 1343  INR 3.1 3.41   Cardiac Enzymes: No results for input(s): CKTOTAL, CKMB, CKMBINDEX, TROPONINI in the last 168 hours. BNP (last 3 results) No results for input(s): PROBNP in the last 8760 hours. HbA1C: No results for input(s): HGBA1C in the last 72 hours. CBG: No results for input(s): GLUCAP in the last 168 hours. Lipid Profile:  Recent Labs  11/29/15 1146  CHOL 260*  HDL 45.90  LDLCALC 186*  TRIG 142.0  CHOLHDL 6   Thyroid Function Tests: No results for input(s): TSH, T4TOTAL, FREET4, T3FREE, THYROIDAB in the last 72 hours. Anemia Panel: No results for input(s): VITAMINB12, FOLATE, FERRITIN, TIBC, IRON, RETICCTPCT in the last 72 hours. Urine analysis:    Component Value Date/Time   COLORURINE YELLOW 09/16/2015 1852   APPEARANCEUR TURBID (A) 09/16/2015 1852   LABSPEC 1.014 09/16/2015 1852   PHURINE 7.0 09/16/2015 1852   GLUCOSEU NEGATIVE 09/16/2015 1852   GLUCOSEU 100 (A) 08/07/2015 0948   HGBUR LARGE (A) 09/16/2015 1852   BILIRUBINUR NEGATIVE 09/16/2015 1852   BILIRUBINUR neg 05/16/2015 1928   KETONESUR NEGATIVE 09/16/2015 1852   PROTEINUR 30 (A) 09/16/2015 1852   UROBILINOGEN 0.2 08/07/2015 0948   NITRITE NEGATIVE 09/16/2015 1852   LEUKOCYTESUR LARGE  (A) 09/16/2015 1852    Creatinine Clearance: Estimated Creatinine Clearance: 32.1 mL/min (by C-G  formula based on SCr of 1.87 mg/dL (H)).  Sepsis Labs: @LABRCNTIP (procalcitonin:4,lacticidven:4) )No results found for this or any previous visit (from the past 240 hour(s)).   Radiological Exams on Admission: Chest Portable 1 View  Result Date: 12/01/2015 CLINICAL DATA:  Known hip fracture. Pre-op image of chest. Pt denies all chest complaints at this time. Hx HTN controlled with medication and multiple heart and lung conditions. EXAM: PORTABLE CHEST 1 VIEW COMPARISON:  05/20/2015 FINDINGS: Cardiac silhouette is mildly enlarged. No mediastinal or hilar masses. No evidence of adenopathy. Clear lungs.  No pleural effusion or pneumothorax. Bony thorax is demineralized. There is an old healed rib fracture on the left. IMPRESSION: No acute cardiopulmonary disease. Electronically Signed   By: Lajean Manes M.D.   On: 12/01/2015 15:35   Dg Hip Unilat With Pelvis 2-3 Views Left  Result Date: 12/01/2015 CLINICAL DATA:  75 year old female with history of trauma from a fall in the bathroom earlier today complaining of left-sided hip pain. EXAM: DG HIP (WITH OR WITHOUT PELVIS) 2-3V LEFT COMPARISON:  No priors. FINDINGS: Minimally displaced intertrochanteric left hip fracture. Left hip is located. No acute displaced fracture of the bony pelvis. Postoperative changes of gamma nail fixation are noted in the right femoral neck where there is a healed intertrochanteric hip fracture. IMPRESSION: 1. Nondisplaced intertrochanteric hip fracture of the left hip. Electronically Signed   By: Vinnie Langton M.D.   On: 12/01/2015 14:32    EKG: Independently reviewed. Sinus rhythm Borderline T wave abnormalities Prolonged QT interval  Assessment/Plan Principal Problem:   Hip fracture (HCC) Active Problems:   Essential hypertension   Seizure disorder (HCC)   GERD (gastroesophageal reflux disease)   Severe obesity  (BMI >= 40) (HCC)   Anxiety and depression, PCP notes    Diabetes mellitus-PCP comments   A-fib ([place])   Acute kidney injury superimposed on chronic kidney disease (HCC)   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HCC)   CAD (coronary artery disease)   Ankle fracture, bimalleolar, closed   Ankle fracture, left   Anemia   Leukocytosis   1. Hip fracture related to mechanical fall in the setting of bilateral fibular fractures. X-ray reveals minimally displaced intertrochanteric left hip fracture acute displaced fracture of the bony pelvis. -Admit to telemetry -Pain management -Obtain a chest x-ray -Await orthopedic recommendations -Nothing by mouth until orthopedic evaluates  #2. 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 Creatinine 1.87. This appears to be slightly above her baseline. Likely related to decreased oral intake in the setting of Lasix. She received 1 L of normal saline in the emergency department. -Continue gentle IV fluids -Hold nephrotoxins -Monitor urine output -Recheck in the morning  #3. Seizure disorder. Stable. Medications include Dilantin. Dilantin sub therapeutic.  -Dilantin dosing per pharmacy   #4. Anemia. Likely of chronic disease. Hemoglobin 10.8 on admission. Chart review indicates this is close to baseline -Type and screen -Continue iron supplement  5. Chronic systolic heart failure. Hears compensated. Recent echo reveals 35-40% EF. Home medications include Lasix, lisinopril, metoprolol -Gentle IV fluids -holding ace and lasix for now. -Obtain daily weights -Monitor urine output  #6. CAD/recent MI. No chest pain. EKG without acute changes.home meds include plavix -Hold Plavix for now  #7. A. fib. Controlled. On Coumadin. Meds also include a beta blocker. INR 3.4 -Hold Coumadin for now  #8. Hypertension. Fair control in the emergency department. -Hold Lasix and lisinopril for now -Continue beta blocker  #9. Leukocytosis. WBCs 19.2. Chart review indicates value 7.0  yesterday.  Reactive. She is afebrile nontoxic appearing. -Follow chest x-ray and urinalysis -Monitor  #10. Diabetes. Serum glucose 296 on admission. She's not eaten or taken her insulin today. -We'll provide 4 units NovoLog now -Hemoglobin A1c -Use sliding scale insulin for optimal control -Carb modified diet  11. Obesity. BMI 29.6 -Nutritional consult  #12. Bilateral ankle fx. Stabe. Secondary to fall in July. Conservative management -PT when ready   DVT prophylaxis: heparin  Code Status: full  Family Communication: husband at bedside  Disposition Plan: home  Consults called: dr Ninfa Linden ortho  Admission status: inpatient    Radene Gunning MD [facility]  If 7PM-7AM, please contact night-coverage [email or web address] Password [email or web address]  12/01/2015, 4:06 PM

## 2015-12-01 NOTE — Progress Notes (Addendum)
MEDICATION RELATED CONSULT NOTE - INITIAL   Pharmacy Consult for Phenytoin Indication: Seizure history  Allergies  Allergen Reactions  . Procaine Hcl Anaphylaxis    Patient Measurements: Height: 5\' 9"  (175.3 cm) Weight: 200 lb (90.7 kg) IBW/kg (Calculated) : 66.2  Vital Signs: BP: 143/56 (10/01 1500) Pulse Rate: 79 (10/01 1500) Intake/Output from previous day: No intake/output data recorded. Intake/Output from this shift: No intake/output data recorded.  Labs:  Recent Labs  11/29/15 1146 12/01/15 1343  WBC 7.0 19.2*  HGB 10.1* 10.8*  HCT 30.1* 33.6*  PLT 329.0 341  CREATININE  --  1.87*  ALBUMIN  --  3.2*  PROT  --  7.2  AST  --  21  ALT  --  10*  ALKPHOS  --  184*  BILITOT  --  0.5   Estimated Creatinine Clearance: 32.1 mL/min (by C-G formula based on SCr of 1.87 mg/dL (H)).   Microbiology: No results found for this or any previous visit (from the past 720 hour(s)).  Medical History: Past Medical History:  Diagnosis Date  . Atrial fibrillation ([place])    SVT dx 2007, cath 2007 mild CAD, then had an cardioversion, ablation; still on coumadin , has occ palpitation, EKG 03-2010 NSR  . Blindness of left eye   . Common migraine    ?hx of  . DIABETES MELLITUS, TYPE II 03/27/2006   dr [name]  . Eye muscle weakness    Right eye weakness after cataract surgery  . GERD (gastroesophageal reflux disease) 10/05/2011  . Headache(784.0) 05/07/2009  . 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 HYPERLIPIDEMIA 03/27/2006  . HYPERTENSION 03/27/2006  . Intertrochanteric fracture of right hip ([place]) 07/13/2012  . 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 Osteopenia 2004   Dexa 2004 showed Osteopenia, DEXA 03/2007 normal  . Osteopenia   . Other chronic cystitis with hematuria   . Recurrent urinary tract infection    Seeing Urology  . 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 Seizures ([place])   . 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[place]) 0/81/3887    Assessment: 75 year old female on phenytoin PTA with  low phenytoin level on admission Corrects to 4   Goal of Therapy:  Level = 10 to 20  Plan:  Phenytoin 500 mg iv x 1 load Resume home dose for now Dilantin level in 2 to 3 days  Thank you Anette Guarneri, PharmD [phone]  12/01/2015,3:29 PM

## 2015-12-01 NOTE — ED Provider Notes (Signed)
[facility] DEPT Provider Note   CSN: [id number] Arrival date & time: 12/01/15  1307     History   Chief Complaint Chief Complaint  Patient presents with  . Fall  . Hip Pain    HPI Connecticut Hatchell is a 75 y.o. female who has  has a past medical history of Atrial fibrillation ([place]); Blindness of left eye; Common migraine; DIABETES MELLITUS, TYPE II (03/27/2006); Eye muscle weakness; GERD (gastroesophageal reflux disease) (10/05/2011); CZYSAYTK(160.1) (05/07/2009); Herpes encephalitis (04/2012); HYPERLIPIDEMIA (03/27/2006); HYPERTENSION (03/27/2006); Intertrochanteric fracture of right hip ([place]) (07/13/2012); LUNG NODULE (09/01/2006); Osteopenia (2004); Osteopenia; Other chronic cystitis with hematuria; Recurrent urinary tract infection; RETINOPATHY, BACKGROUND NOS (03/27/2006); Seizures ([place]); and 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[place]) (0/93/2355). She presents today with cc of fall and hip pain. Patient points to her left groin as the sight of her pain. She is currently under treatment for BL fibular fractures. She alternates a cam walker from R to L daily because of her ambulatory status.She is followed by Dr. Lorin Mercy. The patient states that she took the flu shot yesterday and then last night began vomiting. She still has some nausea, but was able to hold down all her medications today. She has not eaten and did not take her inuslin. She states that today she lost her footing when going the the bathroom and fell directly onto her Left hip. She had immediate severe pain and was unable to walk.  She denies new numbness or tingling.  HPI  Past Medical History:  Diagnosis Date  . Atrial fibrillation ([place])    SVT dx 2007, cath 2007 mild CAD, then had an cardioversion, ablation; still on coumadin , has occ palpitation, EKG 03-2010 NSR  . Blindness of left eye   . Common migraine    ?hx of  . DIABETES MELLITUS, TYPE II 03/27/2006   dr [name]  . Eye muscle weakness    Right eye weakness after cataract surgery  . GERD  (gastroesophageal reflux disease) 10/05/2011  . Headache(784.0) 05/07/2009  . 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 HYPERLIPIDEMIA 03/27/2006  . HYPERTENSION 03/27/2006  . Intertrochanteric fracture of right hip ([place]) 07/13/2012  . 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 Osteopenia 2004   Dexa 2004 showed Osteopenia, DEXA 03/2007 normal  . Osteopenia   . Other chronic cystitis with hematuria   . Recurrent urinary tract infection    Seeing Urology  . 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 Seizures ([place])   . 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[place]) 7/32/2025    Patient Active Problem List   Diagnosis Date Noted  . Ankle fracture, left 09/17/2015  . Ankle fracture, bimalleolar, closed   . Bilateral fibular fractures 09/14/2015  . Hypertensive heart disease 05/20/2015  . CAD (coronary artery disease) 05/20/2015  . Ischemic cardiomyopathy 05/20/2015  . Acute systolic CHF (congestive heart failure) ([place]) 05/20/2015  . PAF (paroxysmal atrial fibrillation) ([place]) 05/20/2015  . Anticoagulated on Coumadin 05/20/2015  . Type 2 diabetes mellitus with vascular disease ([place]) 05/17/2015  . NSTEMI (non-ST elevated myocardial infarction) ([place])   . Bacteremia due to Escherichia coli 05/11/2015  . Uncontrolled hypertension 05/11/2015  . 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[place]) 42/70/6237  . Atrial fibrillation with RVR ([place]) 05/06/2015  . PCP NOTES >>>>>>>>>>>>>>>> 12/28/2014  . Depression 05/17/2014  . Memory loss 05/17/2014  . Diabetes mellitus-PCP comments 01/23/2014  . A-fib ([place]) 01/23/2014  . Anxiety and depression, PCP notes  11/24/2013  . Severe obesity (BMI >= 40) ([place]) 07/14/2013  . Encounter for therapeutic drug monitoring 06/02/2013  .  Dilantin toxicity 11/21/2012  . History of encephalitis 11/18/2012  . Localization-related (focal) (partial) epilepsy and epileptic syndromes with complex partial seizures, without mention of intractable epilepsy 09/27/2012  . Hyponatremia 07/16/2012  . Intertrochanteric fracture of right hip  ([place]) 07/13/2012  . DM (diabetes mellitus) ([place]) 01/19/2012  . GERD (gastroesophageal reflux disease) 10/05/2011  . Shoulder pain 08/27/2011  . Seizure disorder ([place]) 05/24/2011  . UTI (lower urinary tract infection) 05/23/2011  . Annual physical exam 02/10/2011  . Long term (current) use of anticoagulants 09/23/2010  . Atrial fibrillation ([place])   . 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 Hyperlipidemia 03/27/2006  . PERIPHERAL NEUROPATHY 03/27/2006  . 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 Essential hypertension 03/27/2006  . OSTEOPENIA 03/27/2006    Past Surgical History:  Procedure Laterality Date  . CARDIAC CATHETERIZATION N/A 05/17/2015   Procedure: Left Heart Cath and Coronary Angiography;  Surgeon: Jettie Booze, MD;  Location: [facility];  Service: Cardiovascular;  Laterality: N/A;  . CARDIAC CATHETERIZATION N/A 05/17/2015   Procedure: Coronary Balloon Angioplasty;  Surgeon: Jettie Booze, MD;  Location: [facility];  Service: Cardiovascular;  Laterality: N/A;  . FEMUR IM NAIL Right 07/15/2012   Procedure: INTRAMEDULLARY (IM) NAIL HIP;  Surgeon: Johnny Bridge, MD;  Location: [facility];  Service: Orthopedics;  Laterality: Right;  . SHOULDER SURGERY      OB History    No data available       Home Medications    Prior to Admission medications   Medication Sig Start Date End Date Taking? Authorizing Provider  acetaminophen (TYLENOL) 325 MG tablet Take 650 mg by mouth every 6 (six) hours as needed for mild pain or moderate pain.    Historical Provider, MD  atorvastatin (LIPITOR) 80 MG tablet Take 80 mg by mouth daily.    Historical Provider, MD  calcium-vitamin D (OSCAL WITH D) 500-200 MG-UNIT per tablet Take 1 tablet by mouth daily.    Historical Provider, MD  clopidogrel (PLAVIX) 75 MG tablet Take 1 tablet (75 mg total) by mouth daily. 05/21/15   Ripudeep Krystal Eaton, MD  escitalopram (LEXAPRO) 10 MG tablet TAKE TWO TABLETS BY MOUTH ONCE DAILY 07/09/15   Garvin Fila, MD  furosemide (LASIX) 40 MG tablet Take 1 tablet (40 mg total) by mouth 2 (two) times daily. 05/21/15   Ripudeep Krystal Eaton, MD  insulin NPH-regular Human (NOVOLIN 70/30) (70-30) 100 UNIT/ML injection Inject 12 Units into the skin at bedtime. Inject 30 units in the morning. 14 units 2 times daily with lunch and dinnner    Historical Provider, MD  lisinopril (PRINIVIL,ZESTRIL) 5 MG tablet Take 1 tablet (5 mg total) by mouth daily. 06/07/15   Skeet Latch, MD  LORazepam (ATIVAN) 0.5 MG tablet Take 1 tablet (0.5 mg total) by mouth 2 (two) times daily as needed for anxiety. 10/09/15   Colon Branch, MD  metoprolol succinate (TOPROL-XL) 50 MG 24 hr tablet Take 25 mg by mouth daily. Take with or immediately following a meal.    Historical Provider, MD  Multiple Vitamins-Minerals (CENTRUM SILVER ADULT 50+ PO) Take 1 tablet by mouth every morning. Reported on 08/07/2015    Historical Provider, MD  nitroGLYCERIN (NITROSTAT) 0.4 MG SL tablet Place 1 tablet (0.4 mg total) under the tongue every 5 (five) minutes as needed for chest pain. Patient not taking: Reported on 11/29/2015 05/21/15   Ripudeep Krystal Eaton, MD  pantoprazole (PROTONIX) 40 MG tablet Take 1 tablet (40 mg total) by  mouth daily. 08/07/15   Colon Branch, MD  phenytoin (DILANTIN) 200 MG ER capsule Take 1 capsule (200 mg total) by mouth 2 (two) times daily. 09/18/15   Orson Eva, MD  promethazine (PHENERGAN) 12.5 MG tablet Take 1 tablet (12.5 mg total) by mouth every 6 (six) hours as needed for nausea or vomiting. 11/13/15   Colon Branch, MD  Protein (PROCEL) POWD Take 2 scoop by mouth 3 (three) times daily. Stop date 10/26/15    Historical Provider, MD  saccharomyces boulardii (FLORASTOR) 250 MG capsule Take 1 capsule (250 mg total) by mouth 2 (two) times daily. 05/11/15   Nishant Dhungel, MD  warfarin (COUMADIN) 2.5 MG tablet Take 1 tablet (2.5 mg total) by mouth daily. 10/10/15   Evans Lance, MD    Family History Family History  Problem Relation Age of Onset  .  Diabetes Father   . Heart attack Father 91  . Diabetes Sister   . Cancer Neg Hx     no hx of colon or breast cancer    Social History Social History  Substance Use Topics  . Smoking status: Former Smoker    Types: Cigarettes    Quit date: 03/02/1978  . Smokeless tobacco: Never Used  . Alcohol use No     Allergies   Procaine hcl   Review of Systems Review of Systems Ten systems reviewed and are negative for acute change, except as noted in the HPI.    Physical Exam Updated Vital Signs BP 156/63   Resp (!) 27   Ht 5\' 9"  (1.753 m)   Wt 90.7 kg   SpO2 100%   BMI 29.53 kg/m   Physical Exam  Constitutional: She is oriented to person, place, and time. She appears well-developed and well-nourished. No distress.  Obese female, appears uncomfortable.   HENT:  Head: Normocephalic and atraumatic.  Dry oral mucosa   Eyes: Conjunctivae are normal. No scleral icterus.  Neck: Normal range of motion.  Cardiovascular: Normal rate, regular rhythm and normal heart sounds.  Exam reveals no gallop and no friction rub.   No murmur heard. Pulmonary/Chest: Effort normal and breath sounds normal. No respiratory distress.  Abdominal: Soft. Bowel sounds are normal. She exhibits no distension and no mass. There is no tenderness. There is no guarding.  Musculoskeletal:  Right foot in cam boot. Left Foot appears externally rotated and shortened. Exquisetly ttp   Neurological: She is alert and oriented to person, place, and time.  Skin: Skin is warm and dry. She is not diaphoretic.  Nursing note and vitals reviewed.    ED Treatments / Results  Labs (all labs ordered are listed, but only abnormal results are displayed) Labs Reviewed  CBC WITH DIFFERENTIAL/PLATELET  PROTIME-INR  COMPREHENSIVE METABOLIC PANEL  LIPASE, BLOOD  TYPE AND SCREEN    EKG  EKG Interpretation None       Radiology No results found.  Procedures Procedures (including critical care  time)  Medications Ordered in ED Medications  sodium chloride 0.9 % bolus 1,000 mL (0 mLs Intravenous Stopped 12/01/15 1509)    And  0.9 %  sodium chloride infusion (999 mLs Intravenous New Bag/Given 12/01/15 1455)  HYDROmorphone (DILAUDID) injection 0.5 mg (0.5 mg Intravenous Given 12/01/15 1353)  metoprolol succinate (TOPROL-XL) 24 hr tablet 25 mg (not administered)  LORazepam (ATIVAN) tablet 0.5 mg (not administered)  escitalopram (LEXAPRO) tablet 20 mg (not administered)  HYDROcodone-acetaminophen (NORCO/VICODIN) 5-325 MG per tablet 1-2 tablet (not administered)  morphine 2 MG/ML  injection 0.5 mg (not administered)  methocarbamol (ROBAXIN) tablet 500 mg (not administered)    Or  methocarbamol (ROBAXIN) 500 mg in dextrose 5 % 50 mL IVPB (not administered)  zolpidem (AMBIEN) tablet 5 mg (not administered)  docusate sodium (COLACE) capsule 100 mg (not administered)  insulin aspart (novoLOG) injection 4 Units (not administered)  insulin aspart (novoLOG) injection 0-15 Units (not administered)  insulin aspart (novoLOG) injection 0-5 Units (not administered)  phenytoin (DILANTIN) 500 mg in sodium chloride 0.9 % 100 mL IVPB (not administered)  ondansetron (ZOFRAN) injection 4 mg (4 mg Intravenous Given 12/01/15 1352)     Initial Impression / Assessment and Plan / ED Course  I have reviewed the triage vital signs and the nursing notes.  Pertinent labs & imaging results that were available during my care of the patient were reviewed by me and considered in my medical decision making (see chart for details).  Clinical Course     patient with sig Leukocytosis. UA/CXR pending. She appears to have an intratrochanteric Left hip fracture on plai fil. I have placed  Final Clinical Impressions(s) / ED Diagnoses   Final diagnoses:  Hip fracture [facility])   Patient with hip fracture. Admitted to the hospitalist service. I spoke with Dr. Ninfa Linden about the patient who will have her added to the  surgery schedule tomorrow. Hold coumadin at this time.  New Prescriptions New Prescriptions   No medications on file     Margarita Mail, PA-C 12/01/15 1603    Dorie Rank, MD 12/02/15 1201

## 2015-12-01 NOTE — ED Notes (Addendum)
CBG was 268. Notified Nurses.

## 2015-12-02 ENCOUNTER — Inpatient Hospital Stay (HOSPITAL_COMMUNITY): Payer: Medicare Other | Admitting: Certified Registered Nurse Anesthetist

## 2015-12-02 ENCOUNTER — Inpatient Hospital Stay (HOSPITAL_COMMUNITY): Payer: Medicare Other

## 2015-12-02 ENCOUNTER — Encounter (HOSPITAL_COMMUNITY)
Admission: EM | Disposition: A | Discharge status: Home / Self Care | Payer: Self-pay | Source: Home / Self Care | Attending: Internal Medicine

## 2015-12-02 DIAGNOSIS — E118 Type 2 diabetes mellitus with unspecified complications: Secondary | ICD-10-CM

## 2015-12-02 DIAGNOSIS — S72142A Displaced intertrochanteric fracture of left femur, initial encounter for closed fracture: Secondary | ICD-10-CM

## 2015-12-02 DIAGNOSIS — I10 Essential (primary) hypertension: Secondary | ICD-10-CM

## 2015-12-02 DIAGNOSIS — N189 Chronic kidney disease, unspecified: Secondary | ICD-10-CM

## 2015-12-02 DIAGNOSIS — G40909 Epilepsy, unspecified, not intractable, without status epilepticus: Secondary | ICD-10-CM

## 2015-12-02 DIAGNOSIS — F418 Other specified anxiety disorders: Secondary | ICD-10-CM

## 2015-12-02 DIAGNOSIS — N179 Acute kidney failure, unspecified: Secondary | ICD-10-CM

## 2015-12-02 DIAGNOSIS — Z794 Long term (current) use of insulin: Secondary | ICD-10-CM

## 2015-12-02 DIAGNOSIS — I480 Paroxysmal atrial fibrillation: Secondary | ICD-10-CM

## 2015-12-02 HISTORY — PX: FEMUR IM NAIL: SHX1597

## 2015-12-02 LAB — CBC
HCT: 28.2 % — ABNORMAL LOW (ref 36.0–46.0)
Hemoglobin: 8.9 g/dL — ABNORMAL LOW (ref 12.0–15.0)
MCH: 29.7 pg (ref 26.0–34.0)
MCHC: 31.6 g/dL (ref 30.0–36.0)
MCV: 94 fL (ref 78.0–100.0)
PLATELETS: 273 10*3/uL (ref 150–400)
RBC: 3 MIL/uL — ABNORMAL LOW (ref 3.87–5.11)
RDW: 13.9 % (ref 11.5–15.5)
WBC: 14.2 10*3/uL — AB (ref 4.0–10.5)

## 2015-12-02 LAB — PROTIME-INR
INR: 4.41 — AB
INR: 1.57
PROTHROMBIN TIME: 41.2 s — AB (ref 11.4–15.2)
PROTHROMBIN TIME: 18.9 s — AB (ref 11.4–15.2)

## 2015-12-02 LAB — BASIC METABOLIC PANEL
Anion gap: 10 (ref 5–15)
BUN: 19 mg/dL (ref 6–20)
CALCIUM: 8 mg/dL — AB (ref 8.9–10.3)
CO2: 24 mmol/L (ref 22–32)
CREATININE: 1.64 mg/dL — AB (ref 0.44–1.00)
Chloride: 98 mmol/L — ABNORMAL LOW (ref 101–111)
GFR calc Af Amer: 35 mL/min — ABNORMAL LOW (ref >60)
GFR, EST NON AFRICAN AMERICAN: 30 mL/min — AB (ref >60)
Glucose, Bld: 299 mg/dL — ABNORMAL HIGH (ref 65–99)
Potassium: 3.4 mmol/L — ABNORMAL LOW (ref 3.5–5.1)
SODIUM: 132 mmol/L — AB (ref 135–145)

## 2015-12-02 LAB — GLUCOSE, CAPILLARY
GLUCOSE-CAPILLARY: 318 mg/dL — AB (ref 65–99)
GLUCOSE-CAPILLARY: 348 mg/dL — AB (ref 65–99)
GLUCOSE-CAPILLARY: 207 mg/dL — AB (ref 65–99)
GLUCOSE-CAPILLARY: 234 mg/dL — AB (ref 65–99)
GLUCOSE-CAPILLARY: 241 mg/dL — AB (ref 65–99)

## 2015-12-02 SURGERY — INSERTION, INTRAMEDULLARY ROD, FEMUR
Anesthesia: General | Laterality: Left

## 2015-12-02 MED ORDER — FENTANYL CITRATE (PF) 100 MCG/2ML IJ SOLN
INTRAMUSCULAR | Status: AC
  Filled 2015-12-02: qty 2

## 2015-12-02 MED ORDER — FENTANYL CITRATE (PF) 100 MCG/2ML IJ SOLN
INTRAMUSCULAR | Status: AC
  Filled 2015-12-02: qty 4

## 2015-12-02 MED ORDER — PROMETHAZINE HCL 25 MG/ML IJ SOLN: 6.2500 mg | INTRAMUSCULAR | Status: DC | PRN

## 2015-12-02 MED ORDER — METOCLOPRAMIDE HCL 5 MG PO TABS: 5.0000 mg | ORAL_TABLET | Freq: Three times a day (TID) | ORAL | Status: DC | PRN

## 2015-12-02 MED ORDER — WARFARIN - PHARMACIST DOSING INPATIENT: Freq: Every day | Status: DC

## 2015-12-02 MED ORDER — 0.9 % SODIUM CHLORIDE (POUR BTL) OPTIME
TOPICAL | Status: DC | PRN
  Administered 2015-12-02: 300 mL

## 2015-12-02 MED ORDER — SODIUM CHLORIDE 0.9 % IV SOLN: INTRAVENOUS | Status: DC

## 2015-12-02 MED ORDER — SUGAMMADEX SODIUM 200 MG/2ML IV SOLN
INTRAVENOUS | Status: DC | PRN
  Administered 2015-12-02: 200 mg via INTRAVENOUS

## 2015-12-02 MED ORDER — LACTATED RINGERS IV SOLN
INTRAVENOUS | Status: DC | PRN
  Administered 2015-12-02: 20:00:00 via INTRAVENOUS

## 2015-12-02 MED ORDER — ONDANSETRON HCL 4 MG/2ML IJ SOLN
INTRAMUSCULAR | Status: DC | PRN
  Administered 2015-12-02: 4 mg via INTRAVENOUS

## 2015-12-02 MED ORDER — SODIUM CHLORIDE 0.9 % IV SOLN
Freq: Once | INTRAVENOUS | Status: AC
  Administered 2015-12-02: 13:00:00 via INTRAVENOUS

## 2015-12-02 MED ORDER — PHENOL 1.4 % MT LIQD: 1.0000 | OROMUCOSAL | Status: DC | PRN

## 2015-12-02 MED ORDER — SUCCINYLCHOLINE CHLORIDE 200 MG/10ML IV SOSY
PREFILLED_SYRINGE | INTRAVENOUS | Status: DC | PRN
  Administered 2015-12-02: 130 mg via INTRAVENOUS

## 2015-12-02 MED ORDER — INSULIN GLARGINE 100 UNIT/ML ~~LOC~~ SOLN
10.0000 [IU] | Freq: Every day | SUBCUTANEOUS | Status: DC
  Administered 2015-12-03 (×2): 10 [IU] via SUBCUTANEOUS
  Filled 2015-12-02 (×2): qty 0.1

## 2015-12-02 MED ORDER — VITAMIN K1 10 MG/ML IJ SOLN
5.0000 mg | Freq: Once | INTRAVENOUS | Status: AC
  Administered 2015-12-02: 5 mg via INTRAVENOUS
  Filled 2015-12-02: qty 0.5

## 2015-12-02 MED ORDER — PHENYLEPHRINE HCL 10 MG/ML IJ SOLN
INTRAVENOUS | Status: DC | PRN
  Administered 2015-12-02: 50 ug/min via INTRAVENOUS

## 2015-12-02 MED ORDER — ACETAMINOPHEN 325 MG PO TABS
650.0000 mg | ORAL_TABLET | Freq: Four times a day (QID) | ORAL | Status: DC | PRN
Start: 2015-12-02 — End: 2015-12-05

## 2015-12-02 MED ORDER — ONDANSETRON HCL 4 MG PO TABS: 4.0000 mg | ORAL_TABLET | Freq: Four times a day (QID) | ORAL | Status: DC | PRN

## 2015-12-02 MED ORDER — ONDANSETRON HCL 4 MG/2ML IJ SOLN
4.0000 mg | Freq: Four times a day (QID) | INTRAMUSCULAR | Status: DC | PRN
  Administered 2015-12-04: 4 mg via INTRAVENOUS
  Filled 2015-12-02: qty 2

## 2015-12-02 MED ORDER — METOPROLOL TARTRATE 5 MG/5ML IV SOLN
INTRAVENOUS | Status: AC
  Filled 2015-12-02: qty 5

## 2015-12-02 MED ORDER — METOCLOPRAMIDE HCL 5 MG/ML IJ SOLN: 5.0000 mg | Freq: Three times a day (TID) | INTRAMUSCULAR | Status: DC | PRN

## 2015-12-02 MED ORDER — SODIUM CHLORIDE 0.45 % IV SOLN
INTRAVENOUS | Status: DC
  Administered 2015-12-03: 18:00:00 via INTRAVENOUS

## 2015-12-02 MED ORDER — ROCURONIUM BROMIDE 100 MG/10ML IV SOLN
INTRAVENOUS | Status: DC | PRN
  Administered 2015-12-02: 30 mg via INTRAVENOUS

## 2015-12-02 MED ORDER — WARFARIN SODIUM 5 MG PO TABS
5.0000 mg | ORAL_TABLET | Freq: Once | ORAL | Status: AC
  Administered 2015-12-03: 5 mg via ORAL
  Filled 2015-12-02: qty 1

## 2015-12-02 MED ORDER — VANCOMYCIN HCL 1000 MG IV SOLR
INTRAVENOUS | Status: DC | PRN
  Administered 2015-12-02: 1000 mg via INTRAVENOUS

## 2015-12-02 MED ORDER — HYDROMORPHONE HCL 1 MG/ML IJ SOLN: 0.2500 mg | INTRAMUSCULAR | Status: DC | PRN

## 2015-12-02 MED ORDER — DOCUSATE SODIUM 100 MG PO CAPS
100.0000 mg | ORAL_CAPSULE | Freq: Two times a day (BID) | ORAL | Status: DC
Start: 2015-12-02 — End: 2015-12-05
  Administered 2015-12-03 – 2015-12-05 (×5): 100 mg via ORAL
  Filled 2015-12-02 (×6): qty 1

## 2015-12-02 MED ORDER — DEXTROSE 5 % IV SOLN
1.0000 g | INTRAVENOUS | Status: DC
  Administered 2015-12-02 – 2015-12-05 (×4): 1 g via INTRAVENOUS
  Filled 2015-12-02 (×4): qty 10

## 2015-12-02 MED ORDER — VANCOMYCIN HCL IN DEXTROSE 1-5 GM/200ML-% IV SOLN
INTRAVENOUS | Status: AC
  Filled 2015-12-02: qty 200

## 2015-12-02 MED ORDER — ACETAMINOPHEN 650 MG RE SUPP
650.0000 mg | Freq: Four times a day (QID) | RECTAL | Status: DC | PRN
Start: 2015-12-02 — End: 2015-12-05

## 2015-12-02 MED ORDER — KETAMINE HCL-SODIUM CHLORIDE 100-0.9 MG/10ML-% IV SOSY
PREFILLED_SYRINGE | INTRAVENOUS | Status: AC
  Filled 2015-12-02: qty 10

## 2015-12-02 MED ORDER — MENTHOL 3 MG MT LOZG: 1.0000 | LOZENGE | OROMUCOSAL | Status: DC | PRN

## 2015-12-02 MED ORDER — PROPOFOL 10 MG/ML IV BOLUS
INTRAVENOUS | Status: DC | PRN
  Administered 2015-12-02: 170 mg via INTRAVENOUS

## 2015-12-02 MED ORDER — SODIUM CHLORIDE 0.9 % IV SOLN
INTRAVENOUS | Status: DC
  Administered 2015-12-02 – 2015-12-03 (×2): via INTRAVENOUS
  Filled 2015-12-02 (×5): qty 1000

## 2015-12-02 MED ORDER — VANCOMYCIN HCL IN DEXTROSE 1-5 GM/200ML-% IV SOLN
1000.0000 mg | Freq: Two times a day (BID) | INTRAVENOUS | Status: AC
  Administered 2015-12-03: 1000 mg via INTRAVENOUS
  Filled 2015-12-02: qty 200

## 2015-12-02 MED ORDER — HYDROCODONE-ACETAMINOPHEN 5-325 MG PO TABS
1.0000 | ORAL_TABLET | Freq: Four times a day (QID) | ORAL | Status: DC | PRN
  Administered 2015-12-04: 1 via ORAL
  Administered 2015-12-05: 2 via ORAL
  Filled 2015-12-02: qty 1
  Filled 2015-12-02 (×2): qty 2

## 2015-12-02 MED ORDER — FENTANYL CITRATE (PF) 100 MCG/2ML IJ SOLN
INTRAMUSCULAR | Status: DC | PRN
  Administered 2015-12-02 (×4): 50 ug via INTRAVENOUS

## 2015-12-02 SURGICAL SUPPLY — 43 items
BLADE SURG 15 STRL LF DISP TIS (BLADE) ×1 IMPLANT
BLADE SURG 15 STRL SS (BLADE)
BNDG COHESIVE 4X5 TAN STRL (GAUZE/BANDAGES/DRESSINGS) ×3 IMPLANT
COVER MAYO STAND STRL (DRAPES) ×3 IMPLANT
COVER PERINEAL POST (MISCELLANEOUS) ×3 IMPLANT
COVER SURGICAL LIGHT HANDLE (MISCELLANEOUS) ×3 IMPLANT
COVER TABLE BACK 60X90 (DRAPES) ×3 IMPLANT
DRAPE C-ARM 42X72 X-RAY (DRAPES) ×3 IMPLANT
DRAPE STERI IOBAN 125X83 (DRAPES) ×3 IMPLANT
DRSG ADAPTIC 3X8 NADH LF (GAUZE/BANDAGES/DRESSINGS) ×3 IMPLANT
DRSG PAD ABDOMINAL 8X10 ST (GAUZE/BANDAGES/DRESSINGS) ×3 IMPLANT
DURAPREP 26ML APPLICATOR (WOUND CARE) ×3 IMPLANT
ELECT REM PT RETURN 9FT ADLT (ELECTROSURGICAL) ×3
ELECTRODE REM PT RTRN 9FT ADLT (ELECTROSURGICAL) ×1 IMPLANT
EVACUATOR 1/8 PVC DRAIN (DRAIN) IMPLANT
GAUZE SPONGE 4X4 12PLY STRL (GAUZE/BANDAGES/DRESSINGS) ×3 IMPLANT
GLOVE BIOGEL PI IND STRL 8 (GLOVE) ×2 IMPLANT
GLOVE BIOGEL PI INDICATOR 8 (GLOVE) ×4
GLOVE ORTHO TXT STRL SZ7.5 (GLOVE) ×6 IMPLANT
GOWN STRL REUS W/ TWL LRG LVL3 (GOWN DISPOSABLE) ×1 IMPLANT
GOWN STRL REUS W/ TWL XL LVL3 (GOWN DISPOSABLE) ×1 IMPLANT
GOWN STRL REUS W/TWL 2XL LVL3 (GOWN DISPOSABLE) ×3 IMPLANT
GOWN STRL REUS W/TWL LRG LVL3 (GOWN DISPOSABLE) ×3
GOWN STRL REUS W/TWL XL LVL3 (GOWN DISPOSABLE) ×3
GUIDEWIRE BALL NOSE 80CM (WIRE) ×2 IMPLANT
HIP FRAC NAIL LAG SCR 10.5X100 (Orthopedic Implant) ×2 IMPLANT
HIP FRAC NAIL LEFT 11X360MM (Orthopedic Implant) ×3 IMPLANT
KIT BASIN OR (CUSTOM PROCEDURE TRAY) ×3 IMPLANT
KIT ROOM TURNOVER OR (KITS) ×3 IMPLANT
LINER BOOT UNIVERSAL DISP (MISCELLANEOUS) ×3 IMPLANT
MANIFOLD NEPTUNE II (INSTRUMENTS) ×3 IMPLANT
NAIL HIP FRAC LEFT 11X360MM (Orthopedic Implant) IMPLANT
NS IRRIG 1000ML POUR BTL (IV SOLUTION) ×3 IMPLANT
PACK GENERAL/GYN (CUSTOM PROCEDURE TRAY) ×3 IMPLANT
PAD ARMBOARD 7.5X6 YLW CONV (MISCELLANEOUS) ×6 IMPLANT
PIN GUIDE 3.2 903003004 (MISCELLANEOUS) ×4 IMPLANT
SCREW CANN THRD AFF 10.5X100 (Orthopedic Implant) IMPLANT
STAPLER VISISTAT 35W (STAPLE) IMPLANT
SUT VIC AB 0 CT1 27 (SUTURE) ×3
SUT VIC AB 0 CT1 27XBRD ANBCTR (SUTURE) ×1 IMPLANT
SUT VIC AB 2-0 CT1 27 (SUTURE) ×6
SUT VIC AB 2-0 CT1 TAPERPNT 27 (SUTURE) ×2 IMPLANT
WATER STERILE IRR 1000ML POUR (IV SOLUTION) ×6 IMPLANT

## 2015-12-02 NOTE — Brief Op Note (Signed)
12/01/2015 - 12/02/2015  9:41 PM  PATIENT:  [name] [name]  75 y.o. female  PRE-OPERATIVE DIAGNOSIS:  LEFT HIP FRACTURE - INTERTROCHANTERIC  POST-OPERATIVE DIAGNOSIS:  LEFT HIP FRACTURE - INTERTROCHANTERIC  PROCEDURE:  Procedure(s): INTRAMEDULLARY (IM) NAIL FEMORAL (Left)  SURGEON:  Surgeon(s) and Role:    * [name], MD - Primary  PHYSICIAN ASSISTANT:   ASSISTANTS: none   ANESTHESIA:   none  EBL:  No intake/output data recorded.  BLOOD ADMINISTERED:none  DRAINS: none   LOCAL MEDICATIONS USED:  NONE  SPECIMEN:  No Specimen  DISPOSITION OF SPECIMEN:  N/A  COUNTS:  YES  TOURNIQUET:  * No tourniquets in log *  DICTATION: .Other Dictation: Dictation Number [id number]  PLAN OF CARE: already IP  PATIENT DISPOSITION:  PACU - hemodynamically stable.   Delay start of Pharmacological VTE agent (>24hrs) due to surgical blood loss or risk of bleeding: restart coumadin

## 2015-12-02 NOTE — Progress Notes (Signed)
On call hospitalist notified of bacteria in U/A. Urine culture ordered. Pt also needs SSI changed to Q4 hr monitoring since she is NPO.

## 2015-12-02 NOTE — Interval H&P Note (Signed)
History and Physical Interval Note:  12/02/2015 7:50 PM  [place]  has presented today for surgery, with the diagnosis of LEFT HIP FRACTURE - INTERTROCHANTERIC  The various methods of treatment have been discussed with the patient and family. After consideration of risks, benefits and other options for treatment, the patient has consented to  Procedure(s): INTRAMEDULLARY (IM) NAIL FEMORAL (Left) as a surgical intervention .  The patient's history has been reviewed, patient examined, no change in status, stable for surgery.  I have reviewed the patient's chart and labs.  Questions were answered to the patient's satisfaction.     [name] [name]

## 2015-12-02 NOTE — Consult Note (Signed)
Patient ID: KANESHA CADLE MRN: [id number] DOB/AGE: [birth year] 75 y.o.  Admit date: 12/01/2015  Admission Diagnoses:  Principal Problem:   Hip fracture ([place]) Active Problems:   Essential hypertension   Seizure disorder (HCC)   GERD (gastroesophageal reflux disease)   Severe obesity (BMI >= 40) (HCC)   Anxiety and depression, PCP notes    Diabetes mellitus-PCP comments   A-fib ([place])   Acute kidney injury superimposed on chronic kidney disease (HCC)   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H[facility])   CAD (coronary artery disease)   Ankle fracture, bimalleolar, closed   Ankle fracture, left   Anemia   Leukocytosis   HPI: Patient being seen at the request of hospitalist for left hip fracture. Patient states that yesterday she was getting out of bed to go to the bathroom when she tripped landing on left hip.  Unable to weightbear after injury.  She denies LOC or other injuries.  xrays showed a nondisplaced left IT fracture.    Past Medical History: Past Medical History:  Diagnosis Date  . Atrial fibrillation ([place])    SVT dx 2007, cath 2007 mild CAD, then had an cardioversion, ablation; still on coumadin , has occ palpitation, EKG 03-2010 NSR  . Blindness of left eye   . Common migraine    ?hx of  . DIABETES MELLITUS, TYPE II 03/27/2006   dr [name]  . Eye muscle weakness    Right eye weakness after cataract surgery  . GERD (gastroesophageal reflux disease) 10/05/2011  . Headache(784.0) 05/07/2009  . 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 HYPERLIPIDEMIA 03/27/2006  . HYPERTENSION 03/27/2006  . Intertrochanteric fracture of right hip ([place]) 07/13/2012  . 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 Osteopenia 2004   Dexa 2004 showed Osteopenia, DEXA 03/2007 normal  . Osteopenia   . Other chronic cystitis with hematuria   . Recurrent urinary tract infection    Seeing Urology  . 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 Seizures ([place])   . 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[place]) 03/03/5850    Surgical History: Past Surgical  History:  Procedure Laterality Date  . CARDIAC CATHETERIZATION N/A 05/17/2015   Procedure: Left Heart Cath and Coronary Angiography;  Surgeon: Jettie Booze, MD;  Location: [facility];  Service: Cardiovascular;  Laterality: N/A;  . CARDIAC CATHETERIZATION N/A 05/17/2015   Procedure: Coronary Balloon Angioplasty;  Surgeon: Jettie Booze, MD;  Location: [facility];  Service: Cardiovascular;  Laterality: N/A;  . FEMUR IM NAIL Right 07/15/2012   Procedure: INTRAMEDULLARY (IM) NAIL HIP;  Surgeon: Johnny Bridge, MD;  Location: [facility];  Service: Orthopedics;  Laterality: Right;  . SHOULDER SURGERY      Family History: Family History  Problem Relation Age of Onset  . Diabetes Father   . Heart attack Father 59  . Diabetes Sister   . Cancer Neg Hx     no hx of colon or breast cancer    Social History: Social History   Social History  . Marital status: Married    Spouse name: Jenny Reichmann  . Number of children: 1  . Years of education: College   Occupational History  . retired  Retired   Social History Main Topics  . Smoking status: Former Smoker    Types: Cigarettes    Quit date: 03/02/1978  . Smokeless tobacco: Never Used  . Alcohol use No  . Drug use: No  . Sexual activity: No   Other Topics Concern  . Not on file  Social History Narrative   Patient lives at home spouse.   Moved from [place] 2004   1 son, problems w/ drugs, at home         Allergies: Procaine hcl I have reviewed the patient's current medications. Medications:   Vital Signs: Patient Vitals for the past 24 hrs:  BP Temp Temp src Pulse Resp SpO2 Weight  12/02/15 1145 (!) 130/42 99 F (37.2 C) Oral 86 16 96 % -  12/02/15 0858 (!) 116/48 - - 78 - - -  12/02/15 0420 (!) 136/43 99.1 F (37.3 C) Oral 82 - 93 % 95.3 kg (210 lb 3.2 oz)  12/02/15 0200 - - - - - 92 % -  12/02/15 0050 - - - - - 92 % -  12/01/15 2200 - - - - 18 96 % -  12/01/15 1922 (!) 139/59 98.3 F (36.8 C) Oral 78 - 100  % -  12/01/15 1728 (!) 134/47 99 F (37.2 C) Oral 73 16 99 % -    Radiology: Chest Portable 1 View  Result Date: 12/01/2015 CLINICAL DATA:  Known hip fracture. Pre-op image of chest. Pt denies all chest complaints at this time. Hx HTN controlled with medication and multiple heart and lung conditions. EXAM: PORTABLE CHEST 1 VIEW COMPARISON:  05/20/2015 FINDINGS: Cardiac silhouette is mildly enlarged. No mediastinal or hilar masses. No evidence of adenopathy. Clear lungs.  No pleural effusion or pneumothorax. Bony thorax is demineralized. There is an old healed rib fracture on the left. IMPRESSION: No acute cardiopulmonary disease. Electronically Signed   By: Lajean Manes M.D.   On: 12/01/2015 15:35   Dg Hip Unilat With Pelvis 2-3 Views Left  Result Date: 12/01/2015 CLINICAL DATA:  75 year old female with history of trauma from a fall in the bathroom earlier today complaining of left-sided hip pain. EXAM: DG HIP (WITH OR WITHOUT PELVIS) 2-3V LEFT COMPARISON:  No priors. FINDINGS: Minimally displaced intertrochanteric left hip fracture. Left hip is located. No acute displaced fracture of the bony pelvis. Postoperative changes of gamma nail fixation are noted in the right femoral neck where there is a healed intertrochanteric hip fracture. IMPRESSION: 1. Nondisplaced intertrochanteric hip fracture of the left hip. Electronically Signed   By: Vinnie Langton M.D.   On: 12/01/2015 14:32    Labs:  Recent Labs  12/01/15 1343 12/02/15 0328  WBC 19.2* 14.2*  RBC 3.60* 3.00*  HCT 33.6* 28.2*  PLT 341 273    Recent Labs  12/01/15 1343 12/01/15 1712 12/02/15 0328  NA 136  --  132*  K 4.2  --  3.4*  CL 96*  --  98*  CO2 28  --  24  BUN 20  --  19  CREATININE 1.87* 1.72* 1.64*  GLUCOSE 296*  --  299*  CALCIUM 9.0  --  8.0*    Recent Labs  12/01/15 2258 12/02/15 0958  INR 3.73 4.41*    Review of Systems: Review of Systems  Constitutional: Negative.   HENT: Negative.    Respiratory: Negative.   Cardiovascular: Negative.   Gastrointestinal: Negative.   Genitourinary: Negative.   Musculoskeletal: Positive for joint pain.  Skin: Negative.   Neurological: Negative.   Endo/Heme/Allergies: Bruises/bleeds easily (chronic coumadin).  Psychiatric/Behavioral: Negative.     Physical Exam: Neurovascular intact Dorsiflexion/Plantar flexion intact Compartment soft Alert and oriented x 3 Pleasant wf.  PERRL.  No respiratory distress.  Abdomen round nondistended.  Left LE shortened.  bilat calves nontender,  NVI. Skin warm and  dry.   Assessment and Plan: Left hip IT fracture Chronic coumadin for AFib.  Discussed with patient that with this injury best treatment option is orif.  Surgical procedure discussed with patient along with possible recovery.  Question needing short snf placement postop.  All questions answered. Patient currently getting FFP due to elevated INR.  This will need to come down if we are to do surgery today.    Benjiman Core PA-C For Rodell Perna MD [facility] orthopedics

## 2015-12-02 NOTE — Discharge Summary (Signed)
PROGRESS NOTE  [name] [name]  KGU:[id number] DOB: 09/20/1940 DOA: 12/01/2015 PCP: [name], MD Outpatient Specialists:  Subjective: Feels okay, denies any complaints minimal pain on the left leg. INR at 958 and units 4.4. IV vitamin K to be given, initial plans for surgery later today. Discussed with Dr. Lorin Mercy, recommended 2 units of FFP's and recheck INR if it's reasonable She will go to surgery if not the surgery will be postponed.  Brief Narrative:  [name] [name] is a pleasant 75 y.o. female with medical history significant for A. fib on Coumadin CAD Plavix, CHF, hypertension, diabetes, seizure disorder, recent UTI resulting in fall resulting in bilateral fibular fractures since emergency Department chief complaint of left hip pain after a fall. Initial evaluation reveals left hip fracture, acute on chronic kidney injury.  Information is obtained from the chart and the patient and she was in her usual state of health until this morning when she awakened ambulated to the bathroom with her blue boot on her right foot (she alternates legs in wearing blue boot) and she states "my left leg just gave way" and fell. She denies losing consciousness or hitting her head. Her husband came and tried to assist her up with it was too painful. EMS was called. Associated symptoms included mild lightheadedness recent vomiting. She reports she saw her PCP yesterday and obtain the flu shot and feels like that made her nauseous she had one episode of emesis. She denies coffee ground emesis. She denies any chest pain palpitation, shortness of breath, headache syncope or near-syncope. She denies any fever chills cough recent travel or sick contacts.. She denies dysuria hematuria frequency or urgency.  Assessment & Plan:   Principal Problem:   Hip fracture ([place]) Active Problems:   Essential hypertension   Seizure disorder (HCC)   GERD (gastroesophageal reflux disease)   Severe obesity (BMI >= 40)  (HCC)   Anxiety and depression, PCP notes    Diabetes mellitus-PCP comments   A-fib ([place])   Acute kidney injury superimposed on chronic kidney disease (HCC)   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HCC)   CAD (coronary artery disease)   Ankle fracture, bimalleolar, closed   Ankle fracture, left   Anemia   Leukocytosis   Hip fracture related to mechanical fall in the setting of bilateral fibular fractures.  -X-ray reveals minimally displaced intertrochanteric left hip fracture acute displaced fracture of the bony pelvis. -Admit to telemetry -Pain management -Initial orthopedic recommendation to have surgery later today, patient INR is still elevated at 4.4. -Stat FFP's and INR after that.  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  -Creatinine 1.87. This appears to be slightly above her baseline. Likely related to decreased oral intake in the setting of Lasix. -She received 1 L of normal saline in the ED. Continue gentle IV fluids -Hold nephrotoxins -Cr is 1.6 today, check BMP in AM  Seizure disorder. Stable. Medications include Dilantin. Dilantin sub therapeutic.  -Dilantin dosing per pharmacy   Anemia. Likely of chronic disease. Hemoglobin 10.8 on admission. Chart review indicates this is close to baseline -Type and screen -Hb down to 8.9  Chronic systolic heart failure. Hears compensated. Recent echo reveals 35-40% EF. Home medications include Lasix, lisinopril, metoprolol -Gentle IV fluids -holding ace and lasix for now. -Obtain daily weights -Monitor urine output  CAD/recent MI. No chest pain. EKG without acute changes.home meds include plavix -Hold Plavix for now  A. fib. Controlled. On Coumadin. Meds also include a beta blocker. INR 3.4 -Hold  Coumadin for now  Hypertension. Fair control in the emergency department. -Hold Lasix and lisinopril for now -Continue beta blocker  Leukocytosis. WBCs 19.2. Chart review indicates value 7.0 yesterday. Reactive. She is afebrile  nontoxic appearing. -Follow chest x-ray and urinalysis -Monitor  Diabetes. Serum glucose 296 on admission. She's not eaten or taken her insulin today. -We'll provide 4 units NovoLog now -Hemoglobin A1c -Use sliding scale insulin for optimal control -Carb modified diet  Obesity. BMI 29.6 -Nutritional consult  Bilateral ankle fx. Stabe. Secondary to fall in July. Conservative management -PT when ready   DVT prophylaxis: Elevated INR Code Status: Full Code Family Communication:  Disposition Plan:  Diet: Diet NPO time specified  Consultants:   Ortho  Procedures:   None  Antimicrobials:   Rocephin   Objective: Vitals:   12/02/15 0050 12/02/15 0200 12/02/15 0420 12/02/15 0858  BP:   (!) 136/43 (!) 116/48  Pulse:   82 78  Resp:      Temp:   99.1 F (37.3 C)   TempSrc:   Oral   SpO2: 92% 92% 93%   Weight:   95.3 kg (210 lb 3.2 oz)   Height:        Intake/Output Summary (Last 24 hours) at 12/02/15 1117 Last data filed at 12/02/15 0901  Gross per 24 hour  Intake              520 ml  Output              800 ml  Net             -280 ml   Filed Weights   12/01/15 1317 12/02/15 0420  Weight: 90.7 kg (200 lb) 95.3 kg (210 lb 3.2 oz)    Examination: General exam: Appears calm and comfortable  Respiratory system: Clear to auscultation. Respiratory effort normal. Cardiovascular system: S1 & S2 heard, RRR. No JVD, murmurs, rubs, gallops or clicks. No pedal edema. Gastrointestinal system: Abdomen is nondistended, soft and nontender. No organomegaly or masses felt. Normal bowel sounds heard. Central nervous system: Alert and oriented. No focal neurological deficits. Extremities: Symmetric 5 x 5 power. Skin: No rashes, lesions or ulcers Psychiatry: Judgement and insight appear normal. Mood & affect appropriate.   Data Reviewed: I have personally reviewed following labs and imaging studies  CBC:  Recent Labs Lab 11/29/15 1146 12/01/15 1343 12/02/15 0328   WBC 7.0 19.2* 14.2*  NEUTROABS 4.2 15.2*  --   HGB 10.1* 10.8* 8.9*  HCT 30.1* 33.6* 28.2*  MCV 90.3 93.3 94.0  PLT 329.0 341 825   Basic Metabolic Panel:  Recent Labs Lab 12/01/15 1343 12/01/15 1712 12/02/15 0328  NA 136  --  132*  K 4.2  --  3.4*  CL 96*  --  98*  CO2 28  --  24  GLUCOSE 296*  --  299*  BUN 20  --  19  CREATININE 1.87* 1.72* 1.64*  CALCIUM 9.0  --  8.0*   GFR: Estimated Creatinine Clearance: 37.5 mL/min (by C-G formula based on SCr of 1.64 mg/dL (H)). Liver Function Tests:  Recent Labs Lab 12/01/15 1343  AST 21  ALT 10*  ALKPHOS 184*  BILITOT 0.5  PROT 7.2  ALBUMIN 3.2*    Recent Labs Lab 12/01/15 1343  LIPASE 15   No results for input(s): AMMONIA in the last 168 hours. Coagulation Profile:  Recent Labs Lab 12/01/15 1343 12/01/15 2258 12/02/15 0958  INR 3.41 3.73 4.41*   Cardiac Enzymes:  No results for input(s): CKTOTAL, CKMB, CKMBINDEX, TROPONINI in the last 168 hours. BNP (last 3 results) No results for input(s): PROBNP in the last 8760 hours. HbA1C: No results for input(s): HGBA1C in the last 72 hours. CBG:  Recent Labs Lab 12/01/15 1609 12/01/15 2211 12/02/15 0424 12/02/15 0630  GLUCAP 268* 379* 318* 348*   Lipid Profile:  Recent Labs  11/29/15 1146  CHOL 260*  HDL 45.90  LDLCALC 186*  TRIG 142.0  CHOLHDL 6   Thyroid Function Tests: No results for input(s): TSH, T4TOTAL, FREET4, T3FREE, THYROIDAB in the last 72 hours. Anemia Panel: No results for input(s): VITAMINB12, FOLATE, FERRITIN, TIBC, IRON, RETICCTPCT in the last 72 hours. Urine analysis:    Component Value Date/Time   COLORURINE YELLOW 12/01/2015 1759   APPEARANCEUR CLOUDY (A) 12/01/2015 1759   LABSPEC 1.010 12/01/2015 1759   PHURINE 5.5 12/01/2015 1759   GLUCOSEU NEGATIVE 12/01/2015 1759   GLUCOSEU 100 (A) 08/07/2015 0948   HGBUR SMALL (A) 12/01/2015 1759   BILIRUBINUR NEGATIVE 12/01/2015 1759   BILIRUBINUR neg 05/16/2015 1928   KETONESUR  NEGATIVE 12/01/2015 1759   PROTEINUR NEGATIVE 12/01/2015 1759   UROBILINOGEN 0.2 08/07/2015 0948   NITRITE NEGATIVE 12/01/2015 1759   LEUKOCYTESUR MODERATE (A) 12/01/2015 1759   Sepsis Labs: @LABRCNTIP (procalcitonin:4,lacticidven:4)  ) Recent Results (from the past 240 hour(s))  Surgical PCR screen     Status: Abnormal   Collection Time: 12/01/15  7:26 PM  Result Value Ref Range Status   MRSA, PCR POSITIVE (A) NEGATIVE Final    Comment: RESULT CALLED TO, READ BACK BY AND VERIFIED WITH:  TO RLIGHT(RN) BY TCLEVELAND 12/01/2015 AT 9:19PM    Staphylococcus aureus POSITIVE (A) NEGATIVE Final    Comment:        The Xpert SA Assay (FDA approved for NASAL specimens in patients over 66 years of age), is one component of a comprehensive surveillance program.  Test performance has been validated by [facility] for patients greater than or equal to 74 year old. It is not intended to diagnose infection nor to guide or monitor treatment. RESULT CALLED TO, READ BACK BY AND VERIFIED WITH:  TO RLIGHT RESULT CALLED TO, READ BACK BY AND VERIFIED WITH:  TO RLIGHT(RN) BY TCLEVELAND 12/01/2015 AT 9:19PM      Invalid input(s): PROCALCITONIN, LACTICACIDVEN   Radiology Studies: Chest Portable 1 View  Result Date: 12/01/2015 CLINICAL DATA:  Known hip fracture. Pre-op image of chest. Pt denies all chest complaints at this time. Hx HTN controlled with medication and multiple heart and lung conditions. EXAM: PORTABLE CHEST 1 VIEW COMPARISON:  05/20/2015 FINDINGS: Cardiac silhouette is mildly enlarged. No mediastinal or hilar masses. No evidence of adenopathy. Clear lungs.  No pleural effusion or pneumothorax. Bony thorax is demineralized. There is an old healed rib fracture on the left. IMPRESSION: No acute cardiopulmonary disease. Electronically Signed   By: Lajean Manes M.D.   On: 12/01/2015 15:35   Dg Hip Unilat With Pelvis 2-3 Views Left  Result Date: 12/01/2015 CLINICAL DATA:  75 year old  female with history of trauma from a fall in the bathroom earlier today complaining of left-sided hip pain. EXAM: DG HIP (WITH OR WITHOUT PELVIS) 2-3V LEFT COMPARISON:  No priors. FINDINGS: Minimally displaced intertrochanteric left hip fracture. Left hip is located. No acute displaced fracture of the bony pelvis. Postoperative changes of gamma nail fixation are noted in the right femoral neck where there is a healed intertrochanteric hip fracture. IMPRESSION: 1. Nondisplaced intertrochanteric hip fracture of the left  hip. Electronically Signed   By: Vinnie Langton M.D.   On: 12/01/2015 14:32        Scheduled Meds: . Chlorhexidine Gluconate Cloth  6 each Topical Q0600  . docusate sodium  100 mg Oral BID  . escitalopram  20 mg Oral Daily  . insulin aspart  0-15 Units Subcutaneous TID WC  . metoprolol      . metoprolol succinate  25 mg Oral Daily  . mupirocin ointment  1 application Nasal BID  . phenytoin  200 mg Oral BID   Continuous Infusions:    LOS: 1 day    Time spent: 35 minutes    Mikalah Skyles A, MD [facility] Pager [phone]  If 7PM-7AM, please contact night-coverage [email or web address] Password [email or web address] 12/02/2015, 11:17 AM

## 2015-12-02 NOTE — Progress Notes (Signed)
Contacted Dr. Hartford Poli about Vitamin K order, and continued high blood sugars on current insulin scale. Also asked about interventions for UTI.

## 2015-12-02 NOTE — Transfer of Care (Signed)
Immediate Anesthesia Transfer of Care Note  Patient: [name] [name]  Procedure(s) Performed: Procedure(s): INTRAMEDULLARY (IM) NAIL FEMORAL (Left)  Patient Location: PACU  Anesthesia Type:General  Level of Consciousness: oriented, sedated, patient cooperative and responds to stimulation  Airway & Oxygen Therapy: Patient Spontanous Breathing and Patient connected to nasal cannula oxygen  Post-op Assessment: Report given to RN, Post -op Vital signs reviewed and stable and Patient moving all extremities X 4  Post vital signs: Reviewed and stable  Last Vitals:  Vitals:   12/02/15 1628 12/02/15 1730  BP: (!) 155/56 (!) 133/55  Pulse: 95 93  Resp: 18 17  Temp: 37.4 C 36.9 C    Last Pain:  Vitals:   12/02/15 1730  TempSrc: Oral  PainSc:          Complications: No apparent anesthesia complications

## 2015-12-02 NOTE — Progress Notes (Signed)
Initial Nutrition Assessment  DOCUMENTATION CODES:   Obesity unspecified  INTERVENTION:   Encouraged meals and snacks once diet advanced.    NUTRITION DIAGNOSIS:   Increased nutrient needs related to  (fracture with surgery) as evidenced by estimated needs.  GOAL:   Patient will meet greater than or equal to 90% of their needs  MONITOR:   Diet advancement, I & O's, Labs  REASON FOR ASSESSMENT:   Consult Hip fracture protocol  ASSESSMENT:   Pt with medical history significant for A. fib on Coumadin CAD Plavix, CHF, hypertension, diabetes, CKD, seizure disorder, recent UTI who fell resulting in bilateral fibular fractures  Medications reviewed and include: colace, lantus, novolog, NS with KCL @ 75 ml/hr Labs reviewed: Na 132, K+ 3.4 CBG's: 318-348 Nutrition-Focused physical exam completed. Findings are WNL fat depletion, WNL muscle depletion, and no edema.   Pt and husband report intentional weight loss PTA. Good appetite. Encouraged healthy diet after surgery.   Diet Order:  Diet NPO time specified  Skin:  Reviewed, no issues  Last BM:  9/30  Height:   Ht Readings from Last 1 Encounters:  12/01/15 5\' 9"  (1.753 m)    Weight:   Wt Readings from Last 1 Encounters:  12/02/15 210 lb 3.2 oz (95.3 kg)    Ideal Body Weight:  65.9 kg  BMI:  Body mass index is 31.04 kg/m.  Estimated Nutritional Needs:   Kcal:  1600-1800  Protein:  80-90 grams  Fluid:  > 1.7 L/day  EDUCATION NEEDS:   Education needs no appropriate at this time  [place], [place], [place] Pager [phone] After Hours Pager

## 2015-12-02 NOTE — Progress Notes (Signed)
Amion messaged MD to notify INR increase to 4.41

## 2015-12-02 NOTE — Progress Notes (Signed)
Patient ID: [name] [name], female   DOB: [birth year], 75 y.o.   MRN: [id number] protime pending in less than 10 minutes. If acceptable will proceed with surgery tonight. If still elevated and not acceptable will have to cancel surgery and reshedule.

## 2015-12-02 NOTE — Progress Notes (Signed)
ANTICOAGULATION CONSULT NOTE - Initial Consult  Pharmacy Consult for Coumadin Indication: VTE prophylaxis  Allergies  Allergen Reactions  . Procaine Hcl Anaphylaxis    Patient Measurements: Height: 5\' 9"  (175.3 cm) Weight: 210 lb 3.2 oz (95.3 kg) IBW/kg (Calculated) : 66.2  Vital Signs: Temp: 99.3 F (37.4 C) (10/02 2326) Temp Source: Oral (10/02 2326) BP: 163/54 (10/02 2326) Pulse Rate: 82 (10/02 2326)  Labs:  Recent Labs  12/01/15 1343 12/01/15 1712 12/01/15 2258 12/02/15 0328 12/02/15 0958 12/02/15 1759  HGB 10.8*  --   --  8.9*  --   --   HCT 33.6*  --   --  28.2*  --   --   PLT 341  --   --  273  --   --   LABPROT 35.2*  --  37.8*  --  41.2* 18.9*  INR 3.41  --  3.73  --  4.41* 1.57  CREATININE 1.87* 1.72*  --  1.64*  --   --     Estimated Creatinine Clearance: 37.5 mL/min (by C-G formula based on SCr of 1.64 mg/dL (H)).   Medical History: Past Medical History:  Diagnosis Date  . Atrial fibrillation ([place])    SVT dx 2007, cath 2007 mild CAD, then had an cardioversion, ablation; still on coumadin , has occ palpitation, EKG 03-2010 NSR  . Blindness of left eye   . Common migraine    ?hx of  . DIABETES MELLITUS, TYPE II 03/27/2006   dr [name]  . Eye muscle weakness    Right eye weakness after cataract surgery  . GERD (gastroesophageal reflux disease) 10/05/2011  . Headache(784.0) 05/07/2009  . 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 HYPERLIPIDEMIA 03/27/2006  . HYPERTENSION 03/27/2006  . Intertrochanteric fracture of right hip ([place]) 07/13/2012  . 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 Osteopenia 2004   Dexa 2004 showed Osteopenia, DEXA 03/2007 normal  . Osteopenia   . Other chronic cystitis with hematuria   . Recurrent urinary tract infection    Seeing Urology  . 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 Seizures ([place])   . 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[place]) 9/67/8938    Medications:  Prescriptions Prior to Admission  Medication Sig Dispense Refill Last Dose  .  acetaminophen (TYLENOL) 325 MG tablet Take 650 mg by mouth every 6 (six) hours as needed for mild pain or moderate pain.   Past Month at Unknown time  . atorvastatin (LIPITOR) 80 MG tablet Take 80 mg by mouth daily.   12/01/2015 at Unknown time  . calcium-vitamin D (OSCAL WITH D) 500-200 MG-UNIT per tablet Take 1 tablet by mouth daily.   12/01/2015 at Unknown time  . clopidogrel (PLAVIX) 75 MG tablet Take 1 tablet (75 mg total) by mouth daily. 30 tablet 4 12/01/2015 at 0830  . escitalopram (LEXAPRO) 10 MG tablet TAKE TWO TABLETS BY MOUTH ONCE DAILY 180 tablet 3 12/01/2015 at Unknown time  . furosemide (LASIX) 40 MG tablet Take 1 tablet (40 mg total) by mouth 2 (two) times daily. 60 tablet 3 12/01/2015 at Unknown time  . insulin NPH-regular Human (NOVOLIN 70/30) (70-30) 100 UNIT/ML injection Inject 14-30 Units into the skin See admin instructions. Inject 30 units in the morning. 14 units 2 times daily with lunch and dinnner    11/30/2015 at Unknown time  . lisinopril (PRINIVIL,ZESTRIL) 5 MG tablet Take 1 tablet (5 mg total) by mouth daily. 30 tablet 5 12/01/2015 at Unknown time  . LORazepam (ATIVAN) 0.5 MG tablet Take  1 tablet (0.5 mg total) by mouth 2 (two) times daily as needed for anxiety. 60 tablet 0 12/01/2015 at Unknown time  . metoprolol succinate (TOPROL-XL) 50 MG 24 hr tablet Take 25 mg by mouth daily. Take with or immediately following a meal.   12/01/2015 at 0830  . Multiple Vitamins-Minerals (CENTRUM SILVER ADULT 50+ PO) Take 1 tablet by mouth every morning. Reported on 08/07/2015   12/01/2015 at Unknown time  . pantoprazole (PROTONIX) 40 MG tablet Take 1 tablet (40 mg total) by mouth daily. 30 tablet 3 12/01/2015 at Unknown time  . phenytoin (DILANTIN) 200 MG ER capsule Take 1 capsule (200 mg total) by mouth 2 (two) times daily. 60 capsule 0 12/01/2015 at Unknown time  . Protein (PROCEL) POWD Take 2 scoop by mouth 3 (three) times daily. Stop date 10/26/15   12/01/2015 at Unknown time  . saccharomyces  boulardii (FLORASTOR) 250 MG capsule Take 1 capsule (250 mg total) by mouth 2 (two) times daily. 30 capsule 0 12/01/2015 at Unknown time  . warfarin (COUMADIN) 2.5 MG tablet Take 1 tablet (2.5 mg total) by mouth daily. (Patient taking differently: Take 2.5 mg by mouth every morning. ) 75 tablet 1 12/01/2015 at Unknown time  . promethazine (PHENERGAN) 12.5 MG tablet Take 1 tablet (12.5 mg total) by mouth every 6 (six) hours as needed for nausea or vomiting. 30 tablet 0 10/30/2015    Assessment: 75 y.o. female s/p L hip fracture/repair for VTE prophylaxis.  Pt with h/o Afib on Coumadin 2.5 mg daily PTA, Vit K 5 mg IV given 10/2 for supratherapeutic INR   Goal of Therapy:  INR 2-3 Monitor platelets by anticoagulation protocol: Yes   Plan:  Coumadin 5 mg tonight Daily INR  Jahad Old, Bronson Curb 12/02/2015,11:55 PM

## 2015-12-02 NOTE — Anesthesia Preprocedure Evaluation (Signed)
Anesthesia Evaluation    History of Anesthesia Complications Negative for: history of anesthetic complications  Airway        Dental   Pulmonary former smoker,           Cardiovascular hypertension,      Neuro/Psych  Headaches, Seizures -,  PSYCHIATRIC DISORDERS Depression  Neuromuscular disease    GI/Hepatic Neg liver ROS,   Endo/Other  diabetes  Renal/GU      Musculoskeletal   Abdominal   Peds negative pediatric ROS (+)  Hematology   Anesthesia Other Findings   Reproductive/Obstetrics                             Anesthesia Physical Anesthesia Plan Anesthesia Quick Evaluation

## 2015-12-02 NOTE — Anesthesia Procedure Notes (Signed)
Procedure Name: Intubation Date/Time: 12/02/2015 8:20 PM Performed by: Trixie Deis A Pre-anesthesia Checklist: Patient identified, Emergency Drugs available, Suction available and Patient being monitored Patient Re-evaluated:Patient Re-evaluated prior to inductionOxygen Delivery Method: Circle System Utilized Preoxygenation: Pre-oxygenation with 100% oxygen Intubation Type: IV induction Ventilation: Mask ventilation without difficulty Laryngoscope Size: Mac and 3 Grade View: Grade I Tube type: Oral Tube size: 7.5 mm Number of attempts: 1 Airway Equipment and Method: Stylet and Oral airway Placement Confirmation: ETT inserted through vocal cords under direct vision,  positive ETCO2 and breath sounds checked- equal and bilateral Secured at: 21 cm Tube secured with: Tape Dental Injury: Teeth and Oropharynx as per pre-operative assessment

## 2015-12-02 NOTE — Progress Notes (Signed)
Inpatient Diabetes Program Recommendations  AACE/ADA: New Consensus Statement on Inpatient Glycemic Control (2015)  Target Ranges:  Prepandial:   less than 140 mg/dL      Peak postprandial:   less than 180 mg/dL (1-2 hours)      Critically ill patients:  140 - 180 mg/dL   Lab Results  Component Value Date   GLUCAP 207 (H) 12/02/2015   HGBA1C 6.3 06/17/2015    Review of Glycemic Control:  Results for [name] [name], [name] [name] (MRN [id number]) as of 12/02/2015 13:34  Ref. Range 12/01/2015 16:09 12/01/2015 22:11 12/02/2015 04:24 12/02/2015 06:30 12/02/2015 12:02  Glucose-Capillary Latest Ref Range: 65 - 99 mg/dL 268 (H) 379 (H) 318 (H) 348 (H) 207 (H)   Diabetes history: Type 2 diabetes Outpatient Diabetes medications: Novolin 70/30 30 units in the AM, 14 units with lunch, and 14 units with supper Current orders for Inpatient glycemic control:  Novolog moderate tid with meals, Lantus 10 units daily  Inpatient Diabetes Program Recommendations:   Please consider increasing Lantus to 25 units daily based on home dose of 70/30.  While NPO, consider changing Novolog correction to q 4 hours.  Once eating, would likely benefit from the addition of Novolog meal coverage.    Thanks, Adah Perl, RN, BC-ADM Inpatient Diabetes Coordinator Pager [phone] (8a-5p)

## 2015-12-02 NOTE — H&P (View-Only) (Signed)
Patient ID: [name] [name] MRN: [id number] DOB/AGE: [birth year] 74 y.o.  Admit date: 12/01/2015  Admission Diagnoses:  Principal Problem:   Hip fracture ([place]) Active Problems:   Essential hypertension   Seizure disorder (HCC)   GERD (gastroesophageal reflux disease)   Severe obesity (BMI >= 40) (HCC)   Anxiety and depression, PCP notes    Diabetes mellitus-PCP comments   A-fib ([place])   Acute kidney injury superimposed on chronic kidney disease (HCC)   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HCC)   CAD (coronary artery disease)   Ankle fracture, bimalleolar, closed   Ankle fracture, left   Anemia   Leukocytosis   HPI: Patient being seen at the request of hospitalist for left hip fracture. Patient states that yesterday she was getting out of bed to go to the bathroom when she tripped landing on left hip.  Unable to weightbear after injury.  She denies LOC or other injuries.  xrays showed a nondisplaced left IT fracture.    Past Medical History: Past Medical History:  Diagnosis Date  . Atrial fibrillation ([place])    SVT dx 2007, cath 2007 mild CAD, then had an cardioversion, ablation; still on coumadin , has occ palpitation, EKG 03-2010 NSR  . Blindness of left eye   . Common migraine    ?hx of  . DIABETES MELLITUS, TYPE II 03/27/2006   dr [name]  . Eye muscle weakness    Right eye weakness after cataract surgery  . GERD (gastroesophageal reflux disease) 10/05/2011  . Headache(784.0) 05/07/2009  . 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 HYPERLIPIDEMIA 03/27/2006  . HYPERTENSION 03/27/2006  . Intertrochanteric fracture of right hip ([place]) 07/13/2012  . 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 Osteopenia 2004   Dexa 2004 showed Osteopenia, DEXA 03/2007 normal  . Osteopenia   . Other chronic cystitis with hematuria   . Recurrent urinary tract infection    Seeing Urology  . 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 Seizures ([place])   . 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[place]) 0/81/4481    Surgical History: Past Surgical  History:  Procedure Laterality Date  . CARDIAC CATHETERIZATION N/A 05/17/2015   Procedure: Left Heart Cath and Coronary Angiography;  Surgeon: Jettie Booze, MD;  Location: [facility];  Service: Cardiovascular;  Laterality: N/A;  . CARDIAC CATHETERIZATION N/A 05/17/2015   Procedure: Coronary Balloon Angioplasty;  Surgeon: Jettie Booze, MD;  Location: [facility];  Service: Cardiovascular;  Laterality: N/A;  . FEMUR IM NAIL Right 07/15/2012   Procedure: INTRAMEDULLARY (IM) NAIL HIP;  Surgeon: Johnny Bridge, MD;  Location: [facility];  Service: Orthopedics;  Laterality: Right;  . SHOULDER SURGERY      Family History: Family History  Problem Relation Age of Onset  . Diabetes Father   . Heart attack Father 83  . Diabetes Sister   . Cancer Neg Hx     no hx of colon or breast cancer    Social History: Social History   Social History  . Marital status: Married    Spouse name: Jenny Reichmann  . Number of children: 1  . Years of education: College   Occupational History  . retired  Retired   Social History Main Topics  . Smoking status: Former Smoker    Types: Cigarettes    Quit date: 03/02/1978  . Smokeless tobacco: Never Used  . Alcohol use No  . Drug use: No  . Sexual activity: No   Other Topics Concern  . Not on file  Social History Narrative   Patient lives at home spouse.   Moved from [place] 2004   1 son, problems w/ drugs, at home         Allergies: Procaine hcl I have reviewed the patient's current medications. Medications:   Vital Signs: Patient Vitals for the past 24 hrs:  BP Temp Temp src Pulse Resp SpO2 Weight  12/02/15 1145 (!) 130/42 99 F (37.2 C) Oral 86 16 96 % -  12/02/15 0858 (!) 116/48 - - 78 - - -  12/02/15 0420 (!) 136/43 99.1 F (37.3 C) Oral 82 - 93 % 95.3 kg (210 lb 3.2 oz)  12/02/15 0200 - - - - - 92 % -  12/02/15 0050 - - - - - 92 % -  12/01/15 2200 - - - - 18 96 % -  12/01/15 1922 (!) 139/59 98.3 F (36.8 C) Oral 78 - 100  % -  12/01/15 1728 (!) 134/47 99 F (37.2 C) Oral 73 16 99 % -    Radiology: Chest Portable 1 View  Result Date: 12/01/2015 CLINICAL DATA:  Known hip fracture. Pre-op image of chest. Pt denies all chest complaints at this time. Hx HTN controlled with medication and multiple heart and lung conditions. EXAM: PORTABLE CHEST 1 VIEW COMPARISON:  05/20/2015 FINDINGS: Cardiac silhouette is mildly enlarged. No mediastinal or hilar masses. No evidence of adenopathy. Clear lungs.  No pleural effusion or pneumothorax. Bony thorax is demineralized. There is an old healed rib fracture on the left. IMPRESSION: No acute cardiopulmonary disease. Electronically Signed   By: Lajean Manes M.D.   On: 12/01/2015 15:35   Dg Hip Unilat With Pelvis 2-3 Views Left  Result Date: 12/01/2015 CLINICAL DATA:  75 year old female with history of trauma from a fall in the bathroom earlier today complaining of left-sided hip pain. EXAM: DG HIP (WITH OR WITHOUT PELVIS) 2-3V LEFT COMPARISON:  No priors. FINDINGS: Minimally displaced intertrochanteric left hip fracture. Left hip is located. No acute displaced fracture of the bony pelvis. Postoperative changes of gamma nail fixation are noted in the right femoral neck where there is a healed intertrochanteric hip fracture. IMPRESSION: 1. Nondisplaced intertrochanteric hip fracture of the left hip. Electronically Signed   By: Vinnie Langton M.D.   On: 12/01/2015 14:32    Labs:  Recent Labs  12/01/15 1343 12/02/15 0328  WBC 19.2* 14.2*  RBC 3.60* 3.00*  HCT 33.6* 28.2*  PLT 341 273    Recent Labs  12/01/15 1343 12/01/15 1712 12/02/15 0328  NA 136  --  132*  K 4.2  --  3.4*  CL 96*  --  98*  CO2 28  --  24  BUN 20  --  19  CREATININE 1.87* 1.72* 1.64*  GLUCOSE 296*  --  299*  CALCIUM 9.0  --  8.0*    Recent Labs  12/01/15 2258 12/02/15 0958  INR 3.73 4.41*    Review of Systems: Review of Systems  Constitutional: Negative.   HENT: Negative.    Respiratory: Negative.   Cardiovascular: Negative.   Gastrointestinal: Negative.   Genitourinary: Negative.   Musculoskeletal: Positive for joint pain.  Skin: Negative.   Neurological: Negative.   Endo/Heme/Allergies: Bruises/bleeds easily (chronic coumadin).  Psychiatric/Behavioral: Negative.     Physical Exam: Neurovascular intact Dorsiflexion/Plantar flexion intact Compartment soft Alert and oriented x 3 Pleasant wf.  PERRL.  No respiratory distress.  Abdomen round nondistended.  Left LE shortened.  bilat calves nontender,  NVI. Skin warm and  dry.   Assessment and Plan: Left hip IT fracture Chronic coumadin for AFib.  Discussed with patient that with this injury best treatment option is orif.  Surgical procedure discussed with patient along with possible recovery.  Question needing short snf placement postop.  All questions answered. Patient currently getting FFP due to elevated INR.  This will need to come down if we are to do surgery today.    Benjiman Core PA-C For Rodell Perna MD [facility] orthopedics

## 2015-12-02 NOTE — Anesthesia Preprocedure Evaluation (Addendum)
Anesthesia Evaluation  Patient identified by MRN, date of birth, ID band Patient awake    Reviewed: Allergy & Precautions, NPO status , Patient's Chart, lab work & pertinent test results  History of Anesthesia Complications Negative for: history of anesthetic complications  Airway Mallampati: II  TM Distance: >3 FB Neck ROM: Full    Dental  (+) Upper Dentures, Lower Dentures, Dental Advisory Given   Pulmonary former smoker,    Pulmonary exam normal        Cardiovascular hypertension, + CAD and + Past MI  Normal cardiovascular exam+ dysrhythmias Atrial Fibrillation   Left ventricle: The cavity size was normal. There was moderate   concentric hypertrophy. Systolic function was moderately reduced.   The estimated ejection fraction was in the range of 35% to 40%.   Diffuse hypokinesis. The study was not technically sufficient to   allow evaluation of LV diastolic dysfunction due to atrial   fibrillation.   Neuro/Psych  Headaches, Seizures -,  PSYCHIATRIC DISORDERS Depression  Neuromuscular disease    GI/Hepatic Neg liver ROS, GERD  ,  Endo/Other  diabetes  Renal/GU Renal InsufficiencyRenal disease     Musculoskeletal   Abdominal   Peds  Hematology   Anesthesia Other Findings   Reproductive/Obstetrics                             Anesthesia Physical Anesthesia Plan  ASA: III  Anesthesia Plan: General   Post-op Pain Management:    Induction: Intravenous  Airway Management Planned: Oral ETT  Additional Equipment:   Intra-op Plan:   Post-operative Plan: Extubation in OR  Informed Consent: I have reviewed the patients History and Physical, chart, labs and discussed the procedure including the risks, benefits and alternatives for the proposed anesthesia with the patient or authorized representative who has indicated his/her understanding and acceptance.   Dental advisory given  Plan  Discussed with: CRNA and Anesthesiologist  Anesthesia Plan Comments:         Anesthesia Quick Evaluation

## 2015-12-03 ENCOUNTER — Encounter (HOSPITAL_COMMUNITY): Payer: Self-pay | Admitting: Orthopaedic Surgery

## 2015-12-03 DIAGNOSIS — I5022 Chronic systolic (congestive) heart failure: Secondary | ICD-10-CM

## 2015-12-03 DIAGNOSIS — S82841K Displaced bimalleolar fracture of right lower leg, subsequent encounter for closed fracture with nonunion: Secondary | ICD-10-CM

## 2015-12-03 DIAGNOSIS — I2583 Coronary atherosclerosis due to lipid rich plaque: Secondary | ICD-10-CM

## 2015-12-03 DIAGNOSIS — K219 Gastro-esophageal reflux disease without esophagitis: Secondary | ICD-10-CM

## 2015-12-03 DIAGNOSIS — I2510 Atherosclerotic heart disease of native coronary artery without angina pectoris: Secondary | ICD-10-CM

## 2015-12-03 LAB — BASIC METABOLIC PANEL
ANION GAP: 11 (ref 5–15)
BUN: 21 mg/dL — ABNORMAL HIGH (ref 6–20)
CHLORIDE: 97 mmol/L — AB (ref 101–111)
CO2: 27 mmol/L (ref 22–32)
Calcium: 8.1 mg/dL — ABNORMAL LOW (ref 8.9–10.3)
Creatinine, Ser: 1.76 mg/dL — ABNORMAL HIGH (ref 0.44–1.00)
GFR calc non Af Amer: 28 mL/min — ABNORMAL LOW (ref >60)
GFR, EST AFRICAN AMERICAN: 32 mL/min — AB (ref >60)
Glucose, Bld: 330 mg/dL — ABNORMAL HIGH (ref 65–99)
POTASSIUM: 3.9 mmol/L (ref 3.5–5.1)
SODIUM: 135 mmol/L (ref 135–145)

## 2015-12-03 LAB — CBC
HEMATOCRIT: 25.6 % — AB (ref 36.0–46.0)
HEMOGLOBIN: 8.3 g/dL — AB (ref 12.0–15.0)
MCH: 30.4 pg (ref 26.0–34.0)
MCHC: 32.4 g/dL (ref 30.0–36.0)
MCV: 93.8 fL (ref 78.0–100.0)
PLATELETS: 232 10*3/uL (ref 150–400)
RBC: 2.73 MIL/uL — AB (ref 3.87–5.11)
RDW: 13.9 % (ref 11.5–15.5)
WBC: 16 10*3/uL — AB (ref 4.0–10.5)

## 2015-12-03 LAB — PREPARE FRESH FROZEN PLASMA
UNIT DIVISION: 0
UNIT DIVISION: 0

## 2015-12-03 LAB — GLUCOSE, CAPILLARY
GLUCOSE-CAPILLARY: 340 mg/dL — AB (ref 65–99)
GLUCOSE-CAPILLARY: 333 mg/dL — AB (ref 65–99)
Glucose-Capillary: 285 mg/dL — ABNORMAL HIGH (ref 65–99)
Glucose-Capillary: 269 mg/dL — ABNORMAL HIGH (ref 65–99)
Glucose-Capillary: 215 mg/dL — ABNORMAL HIGH (ref 65–99)

## 2015-12-03 LAB — HEMOGLOBIN A1C
Hgb A1c MFr Bld: 7.3 % — ABNORMAL HIGH (ref 4.8–5.6)
MEAN PLASMA GLUCOSE: 163 mg/dL

## 2015-12-03 LAB — PROTIME-INR
INR: 1.27
Prothrombin Time: 16 seconds — ABNORMAL HIGH (ref 11.4–15.2)

## 2015-12-03 MED ORDER — INSULIN GLARGINE 100 UNIT/ML ~~LOC~~ SOLN
10.0000 [IU] | Freq: Once | SUBCUTANEOUS | Status: AC
  Administered 2015-12-03: 10 [IU] via SUBCUTANEOUS
  Filled 2015-12-03: qty 0.1

## 2015-12-03 MED ORDER — WARFARIN SODIUM 2 MG PO TABS
2.0000 mg | ORAL_TABLET | Freq: Once | ORAL | Status: AC
  Administered 2015-12-03: 2 mg via ORAL
  Filled 2015-12-03: qty 1

## 2015-12-03 MED ORDER — INSULIN GLARGINE 100 UNIT/ML ~~LOC~~ SOLN
20.0000 [IU] | Freq: Every day | SUBCUTANEOUS | Status: DC
Start: 2015-12-04 — End: 2015-12-05
  Administered 2015-12-04 – 2015-12-05 (×2): 20 [IU] via SUBCUTANEOUS
  Filled 2015-12-03 (×2): qty 0.2

## 2015-12-03 MED ORDER — HEPARIN SODIUM (PORCINE) 5000 UNIT/ML IJ SOLN
5000.0000 [IU] | Freq: Three times a day (TID) | INTRAMUSCULAR | Status: DC
  Administered 2015-12-03 – 2015-12-05 (×6): 5000 [IU] via SUBCUTANEOUS
  Filled 2015-12-03 (×6): qty 1

## 2015-12-03 NOTE — Progress Notes (Signed)
[facility] for Warfarin Indication: VTE prophylaxis  Allergies  Allergen Reactions  . Procaine Hcl Anaphylaxis    Patient Measurements: Height: 5\' 9"  (175.3 cm) Weight: 213 lb 11.2 oz (96.9 kg) IBW/kg (Calculated) : 66.2  Vital Signs: Temp: 98.2 F (36.8 C) (10/03 0500) Temp Source: Oral (10/03 0500) BP: 127/40 (10/03 0859) Pulse Rate: 79 (10/03 0859)  Labs:  Recent Labs  12/01/15 1343 12/01/15 1712  12/02/15 0328 12/02/15 0958 12/02/15 1759 12/03/15 0453  HGB 10.8*  --   --  8.9*  --   --  8.3*  HCT 33.6*  --   --  28.2*  --   --  25.6*  PLT 341  --   --  273  --   --  232  LABPROT 35.2*  --   < >  --  41.2* 18.9* 16.0*  INR 3.41  --   < >  --  4.41* 1.57 1.27  CREATININE 1.87* 1.72*  --  1.64*  --   --  1.76*  < > = values in this interval not displayed.  Estimated Creatinine Clearance: 35.3 mL/min (by C-G formula based on SCr of 1.76 mg/dL (H)).   Medical History: Past Medical History:  Diagnosis Date  . Atrial fibrillation ([place])    SVT dx 2007, cath 2007 mild CAD, then had an cardioversion, ablation; still on coumadin , has occ palpitation, EKG 03-2010 NSR  . Blindness of left eye   . Common migraine    ?hx of  . DIABETES MELLITUS, TYPE II 03/27/2006   dr [name]  . Eye muscle weakness    Right eye weakness after cataract surgery  . GERD (gastroesophageal reflux disease) 10/05/2011  . Headache(784.0) 05/07/2009  . 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 HYPERLIPIDEMIA 03/27/2006  . HYPERTENSION 03/27/2006  . Intertrochanteric fracture of right hip ([place]) 07/13/2012  . 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 Osteopenia 2004   Dexa 2004 showed Osteopenia, DEXA 03/2007 normal  . Osteopenia   . Other chronic cystitis with hematuria   . Recurrent urinary tract infection    Seeing Urology  . 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 Seizures ([place])   . 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[place]) 9/37/1696    Medications: see medical  record  Assessment: 75 y.o. female s/p L hip fracture/repair for VTE prophylaxis.  Pt with h/o Afib on warfarin 2.5 mg daily PTA, Vit K 5 mg IV given 10/2 for supratherapeutic INR.  INR is 1.27 today but does not reflect 5 mg dose overnight. Effects of vitamin K likely to hang around a few days. PO intake ~40% today.  Goal of Therapy:  INR 2-3 Monitor platelets by anticoagulation protocol: Yes   Plan:  Warfarin 5 mg x 1 Monitor daily INR, CBC, clinical course, s/sx of bleed, PO intake, DDI   Thank you for allowing [place] to participate in this patients care. Jens Som, PharmD Pager: [phone] 12/03/2015,12:34 PM

## 2015-12-03 NOTE — Progress Notes (Signed)
Placed aquacell on patient's incision per Dr. Lorin Mercy order. Patient handled well.

## 2015-12-03 NOTE — Progress Notes (Signed)
CHG bath performed by NT Naquida Foddrell.

## 2015-12-03 NOTE — Progress Notes (Signed)
Subjective: 1 Day Post-Op Procedure(s) (LRB): INTRAMEDULLARY (IM) NAIL FEMORAL (Left) Patient reports pain as 1 on 0-10 scale.    Objective: Vital signs in last 24 hours: Temp:  [97.7 F (36.5 C)-100.4 F (38 C)] 98.2 F (36.8 C) (10/03 0500) Pulse Rate:  [78-98] 81 (10/03 0500) Resp:  [15-21] 17 (10/03 0500) BP: (116-163)/(42-74) 121/56 (10/03 0500) SpO2:  [95 %-100 %] 97 % (10/03 0500) Weight:  [96.9 kg (213 lb 11.2 oz)] 96.9 kg (213 lb 11.2 oz) (10/03 0700)  Intake/Output from previous day: 10/02 0701 - 10/03 0700 In: 2551 [I.V.:700; Blood:1801; IV Piggyback:50] Out: 150 [Urine:50; Blood:100] Intake/Output this shift: No intake/output data recorded.   Recent Labs  12/01/15 1343 12/02/15 0328 12/03/15 0453  HGB 10.8* 8.9* 8.3*    Recent Labs  12/02/15 0328 12/03/15 0453  WBC 14.2* 16.0*  RBC 3.00* 2.73*  HCT 28.2* 25.6*  PLT 273 232    Recent Labs  12/02/15 0328 12/03/15 0453  NA 132* 135  K 3.4* 3.9  CL 98* 97*  CO2 24 27  BUN 19 21*  CREATININE 1.64* 1.76*  GLUCOSE 299* 330*  CALCIUM 8.0* 8.1*    Recent Labs  12/02/15 1759 12/03/15 0453  INR 1.57 1.27    Neurologically intact  Assessment/Plan: 1 Day Post-Op Procedure(s) (LRB): INTRAMEDULLARY (IM) NAIL FEMORAL (Left) Up with therapy dressing change.   [name] 12/03/2015, 7:48 AM

## 2015-12-03 NOTE — Evaluation (Signed)
Physical Therapy Evaluation Patient Details Name: [name] [name] MRN: [id number] DOB: 1941/01/12 Today's Date: 12/03/2015   History of Present Illness  pt presents with L Femur fx s/p IM Nail.  pt with recent Bil Ankle fxs and hx of HTN, Seizure, Anxiety, Depression, DM, A-fib, CHF, CAD, Herpes, and L eye blindness.    Clinical Impression  Pt at this time requiring extensive 2 person A for any mobility.  Pt sitting in bed on arrival and unaware she had stooled herself, and unable to participate in hygiene.  Pt states her husband is unable to provide any physical A for her, so discussed the need for SNF at D/C.  At this time pt is adamantly refusing SNF, however very concerned about pt and husband's ability to care for pt at home at this time.  Will continue to follow.      Follow Up Recommendations SNF    Equipment Recommendations  None recommended by PT    Recommendations for Other Services       Precautions / Restrictions Precautions Precautions: Fall Precaution Comments: Per pt report she is WBAT on Bil ankles and is supposed to be wearing a "boot" and alternating between wearing it on  Restrictions Weight Bearing Restrictions: Yes RLE Weight Bearing: Weight bearing as tolerated LLE Weight Bearing: Weight bearing as tolerated      Mobility  Bed Mobility Overal bed mobility: Needs Assistance Bed Mobility: Rolling;Sidelying to Sit Rolling: Mod assist Sidelying to sit: Max assist       General bed mobility comments: pt rolled on R side for peri hygiene as pt was unaware she had stooled in bed.  cues for sequencing with coming to sitting.  A with Bil LEs and bringing trunk up to sitting.  Transfers Overall transfer level: Needs assistance Equipment used: Rolling walker (2 wheeled);2 person hand held assist Transfers: Sit to/from [facility] Sit to Stand: Max assist;+2 physical assistance Stand pivot transfers: Max assist;+2 physical assistance        General transfer comment: Attempted to come to stand with 2 person A and RW, however pt too painful and requests to sit back down.  pt again stooled sitting EOB, so 3rd person present to perform hygiene while nsg tech and PT A with coming to stand and then completing pivot to recliner.  pt does attempt to take pivotal steps towards R side.    Ambulation/Gait                Stairs            Wheelchair Mobility    Modified Rankin (Stroke Patients Only)       Balance Overall balance assessment: Needs assistance Sitting-balance support: Bilateral upper extremity supported;Feet supported Sitting balance-Leahy Scale: Poor     Standing balance support: During functional activity Standing balance-Leahy Scale: Zero                               Pertinent Vitals/Pain Pain Assessment: Faces Faces Pain Scale: Hurts even more Pain Location: L hip and Bil ankles.   Pain Descriptors / Indicators: Grimacing;Guarding (calling out) Pain Intervention(s): Monitored during session;Repositioned (Encouraged pt to take pain meds from RN.)    Home Living Family/patient expects to be discharged to:: Private residence Living Arrangements: Spouse/significant other Available Help at Discharge: Family;Available 24 hours/day (Husband can only provide S and homemaking A.) Type of Home: House Home Access: Stairs to enter Entrance Stairs-Rails:  None Entrance Stairs-Number of Steps: 1 Home Layout: Multi-level;1/2 bath on main level (pt has been sleeping on hospital bed and using half bath.) Home Equipment: Walker - 2 wheels;Bedside commode (hospital bed)      Prior Function Level of Independence: Needs assistance   Gait / Transfers Assistance Needed: Only ambulates short distances and working with [name] [name].  ADL's / Homemaking Assistance Needed: Husband performs homemaking tasks and pt has an aide to A with ADLs.          Hand Dominance        Extremity/Trunk Assessment    Upper Extremity Assessment: Defer to OT evaluation           Lower Extremity Assessment: Generalized weakness;LLE deficits/detail   LLE Deficits / Details: Sensation intact, very limited strength due to pain.    Cervical / Trunk Assessment: Kyphotic  Communication   Communication: No difficulties  Cognition Arousal/Alertness: Awake/alert Behavior During Therapy: WFL for tasks assessed/performed Overall Cognitive Status: Within Functional Limits for tasks assessed                      General Comments      Exercises     Assessment/Plan    PT Assessment Patient needs continued PT services  PT Problem List Decreased strength;Decreased activity tolerance;Decreased balance;Decreased mobility;Decreased knowledge of use of DME;Obesity;Pain          PT Treatment Interventions DME instruction;Gait training;Stair training;Functional mobility training;Therapeutic activities;Therapeutic exercise;Balance training;Patient/family education    PT Goals (Current goals can be found in the Care Plan section)  Acute Rehab PT Goals Patient Stated Goal: Home ASAP PT Goal Formulation: With patient Time For Goal Achievement: 12/17/15 Potential to Achieve Goals: Fair    Frequency Min 5X/week   Barriers to discharge Decreased caregiver support;Inaccessible home environment      Co-evaluation               End of Session Equipment Utilized During Treatment: Gait belt Activity Tolerance: Patient limited by pain (Limited by nausea) Patient left: in chair;with call bell/phone within reach;with nursing/sitter in room Nurse Communication: Mobility status         Time: 2671-2458 PT Time Calculation (min) (ACUTE ONLY): 48 min   Charges:   PT Evaluation $PT Eval Moderate Complexity: 1 Procedure PT Treatments $Therapeutic Activity: 23-37 mins   PT G CodesCatarina Hartshorn, [place] 12/03/2015, 12:24 PM

## 2015-12-03 NOTE — Discharge Summary (Signed)
PROGRESS NOTE  [name] [name]  QQI:[id number] DOB: 09-15-1940 DOA: 12/01/2015 PCP: [name], MD Outpatient Specialists:  Subjective: Patient doing very okay, denies any significant pain. Has some vomiting yesterday and x-rays show possible ileus.  Brief Narrative:  [name] [name] is a pleasant 75 y.o. female with medical history significant for A. fib on Coumadin CAD Plavix, CHF, hypertension, diabetes, seizure disorder, recent UTI resulting in fall resulting in bilateral fibular fractures since emergency Department chief complaint of left hip pain after a fall. Initial evaluation reveals left hip fracture, acute on chronic kidney injury.  Information is obtained from the chart and the patient and she was in her usual state of health until this morning when she awakened ambulated to the bathroom with her blue boot on her right foot (she alternates legs in wearing blue boot) and she states "my left leg just gave way" and fell. She denies losing consciousness or hitting her head. Her husband came and tried to assist her up with it was too painful. EMS was called. Associated symptoms included mild lightheadedness recent vomiting. She reports she saw her PCP yesterday and obtain the flu shot and feels like that made her nauseous she had one episode of emesis. She denies coffee ground emesis. She denies any chest pain palpitation, shortness of breath, headache syncope or near-syncope. She denies any fever chills cough recent travel or sick contacts.. She denies dysuria hematuria frequency or urgency.  Assessment & Plan:   Principal Problem:   Hip fracture ([place]) Active Problems:   Essential hypertension   Seizure disorder (HCC)   GERD (gastroesophageal reflux disease)   Severe obesity (BMI >= 40) (HCC)   Anxiety and depression, PCP notes    Diabetes mellitus-PCP comments   A-fib ([place])   Acute kidney injury superimposed on chronic kidney disease (HCC)   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HCC)   CAD (coronary  artery disease)   Ankle fracture, bimalleolar, closed   Ankle fracture, left   Anemia   Leukocytosis   Hip fracture related to mechanical fall in the setting of bilateral fibular fractures.  -X-ray reveals minimally displaced intertrochanteric left hip fracture acute displaced fracture of the bony pelvis. -Admit to telemetry -Pain management -Initial orthopedic recommendation to have surgery later today, patient INR is still elevated at 4.4. -Stat FFP's and INR after that.  Escherichia coli UTI -Currently on Rocephin, continue. We'll adjust antibiotic according to culture results.  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  -Baseline creatinine about 1.4 from July of this year. presented with Creatinine of 1.8. -Acute and Chronic Renal Failure, hydrated with IV fluids, check BMP in a.m.  Seizure disorder. Stable. Medications include Dilantin. Dilantin sub therapeutic.  -Dilantin dosing per pharmacy   Anemia. Likely of chronic disease. Hemoglobin 10.8 on admission. Chart review indicates this is close to baseline -Type and screen -Hb down to 8.3  Chronic systolic heart failure.  -Appears compensated. Recent echo reveals 35-40% EF. Home medications include Lasix, lisinopril, metoprolol. -Started on IV fluids, Cipro and Lasix held.  CAD/recent MI. No chest pain. EKG without acute changes.home meds include plavix -Hold Plavix for now  A. fib. Controlled. On Coumadin. Meds also include a beta blocker. INR 3.4 -CHA2DS2-VASc is at least 5 (HTN/DM/age/CHF/female) -Coumadin was 4.4, received 5 mg of IV vitamin K, Coumadin to be restarted by pharmacy.  Hypertension. -Hold Lasix and lisinopril for now -Continue beta blocker  Leukocytosis. WBCs 19.2. Chart review indicates value 7.0 yesterday. Likely secondary to UTI.  Diabetes.  Serum glucose 296 on admission. She's not eaten or taken her insulin today. -Hemoglobin A1c is 7.3. -Use sliding scale insulin for optimal  control -Increase Lantus insulin.  Obesity. BMI 29.6 -Nutritional consult  Bilateral ankle fx. Stabe. Secondary to fall in July. Conservative management -PT when ready   DVT prophylaxis: Subcutaneous heparin, INR is 1.27 pharmacy to dose Coumadin. Code Status: Full Code Family Communication:  Disposition Plan:  Diet: Diet Carb Modified Fluid consistency: Thin; Room service appropriate? Yes  Consultants:   Ortho  Procedures:   None  Antimicrobials:   Rocephin   Objective: Vitals:   12/03/15 0236 12/03/15 0500 12/03/15 0700 12/03/15 0859  BP: (!) 140/48 (!) 121/56  (!) 127/40  Pulse: 78 81  79  Resp: 18 17    Temp: (!) 100.4 F (38 C) 98.2 F (36.8 C)    TempSrc: Oral Oral    SpO2: 98% 97%    Weight:   96.9 kg (213 lb 11.2 oz)   Height:        Intake/Output Summary (Last 24 hours) at 12/03/15 1135 Last data filed at 12/03/15 0843  Gross per 24 hour  Intake             2791 ml  Output              100 ml  Net             2691 ml   Filed Weights   12/01/15 1317 12/02/15 0420 12/03/15 0700  Weight: 90.7 kg (200 lb) 95.3 kg (210 lb 3.2 oz) 96.9 kg (213 lb 11.2 oz)    Examination: General exam: Appears calm and comfortable  Respiratory system: Clear to auscultation. Respiratory effort normal. Cardiovascular system: S1 & S2 heard, RRR. No JVD, murmurs, rubs, gallops or clicks. No pedal edema. Gastrointestinal system: Abdomen is nondistended, soft and nontender. No organomegaly or masses felt. Normal bowel sounds heard. Central nervous system: Alert and oriented. No focal neurological deficits. Extremities: Symmetric 5 x 5 power. Skin: No rashes, lesions or ulcers Psychiatry: Judgement and insight appear normal. Mood & affect appropriate.   Data Reviewed: I have personally reviewed following labs and imaging studies  CBC:  Recent Labs Lab 11/29/15 1146 12/01/15 1343 12/02/15 0328 12/03/15 0453  WBC 7.0 19.2* 14.2* 16.0*  NEUTROABS 4.2 15.2*  --    --   HGB 10.1* 10.8* 8.9* 8.3*  HCT 30.1* 33.6* 28.2* 25.6*  MCV 90.3 93.3 94.0 93.8  PLT 329.0 341 273 767   Basic Metabolic Panel:  Recent Labs Lab 12/01/15 1343 12/01/15 1712 12/02/15 0328 12/03/15 0453  NA 136  --  132* 135  K 4.2  --  3.4* 3.9  CL 96*  --  98* 97*  CO2 28  --  24 27  GLUCOSE 296*  --  299* 330*  BUN 20  --  19 21*  CREATININE 1.87* 1.72* 1.64* 1.76*  CALCIUM 9.0  --  8.0* 8.1*   GFR: Estimated Creatinine Clearance: 35.3 mL/min (by C-G formula based on SCr of 1.76 mg/dL (H)). Liver Function Tests:  Recent Labs Lab 12/01/15 1343  AST 21  ALT 10*  ALKPHOS 184*  BILITOT 0.5  PROT 7.2  ALBUMIN 3.2*    Recent Labs Lab 12/01/15 1343  LIPASE 15   No results for input(s): AMMONIA in the last 168 hours. Coagulation Profile:  Recent Labs Lab 12/01/15 1343 12/01/15 2258 12/02/15 0958 12/02/15 1759 12/03/15 0453  INR 3.41 3.73 4.41* 1.57 1.27  Cardiac Enzymes: No results for input(s): CKTOTAL, CKMB, CKMBINDEX, TROPONINI in the last 168 hours. BNP (last 3 results) No results for input(s): PROBNP in the last 8760 hours. HbA1C:  Recent Labs  12/02/15 0328  HGBA1C 7.3*   CBG:  Recent Labs Lab 12/02/15 1202 12/02/15 1611 12/02/15 2149 12/03/15 0202 12/03/15 0650  GLUCAP 207* 234* 241* 340* 333*   Lipid Profile: No results for input(s): CHOL, HDL, LDLCALC, TRIG, CHOLHDL, LDLDIRECT in the last 72 hours. Thyroid Function Tests: No results for input(s): TSH, T4TOTAL, FREET4, T3FREE, THYROIDAB in the last 72 hours. Anemia Panel: No results for input(s): VITAMINB12, FOLATE, FERRITIN, TIBC, IRON, RETICCTPCT in the last 72 hours. Urine analysis:    Component Value Date/Time   COLORURINE YELLOW 12/01/2015 1759   APPEARANCEUR CLOUDY (A) 12/01/2015 1759   LABSPEC 1.010 12/01/2015 1759   PHURINE 5.5 12/01/2015 1759   GLUCOSEU NEGATIVE 12/01/2015 1759   GLUCOSEU 100 (A) 08/07/2015 0948   HGBUR SMALL (A) 12/01/2015 1759   BILIRUBINUR  NEGATIVE 12/01/2015 1759   BILIRUBINUR neg 05/16/2015 1928   KETONESUR NEGATIVE 12/01/2015 1759   PROTEINUR NEGATIVE 12/01/2015 1759   UROBILINOGEN 0.2 08/07/2015 0948   NITRITE NEGATIVE 12/01/2015 1759   LEUKOCYTESUR MODERATE (A) 12/01/2015 1759   Sepsis Labs: @LABRCNTIP (procalcitonin:4,lacticidven:4)  ) Recent Results (from the past 240 hour(s))  Surgical PCR screen     Status: Abnormal   Collection Time: 12/01/15  7:26 PM  Result Value Ref Range Status   MRSA, PCR POSITIVE (A) NEGATIVE Final    Comment: RESULT CALLED TO, READ BACK BY AND VERIFIED WITH:  TO RLIGHT(RN) BY TCLEVELAND 12/01/2015 AT 9:19PM    Staphylococcus aureus POSITIVE (A) NEGATIVE Final    Comment:        The Xpert SA Assay (FDA approved for NASAL specimens in patients over 15 years of age), is one component of a comprehensive surveillance program.  Test performance has been validated by [facility] for patients greater than or equal to 44 year old. It is not intended to diagnose infection nor to guide or monitor treatment. RESULT CALLED TO, READ BACK BY AND VERIFIED WITH:  TO RLIGHT RESULT CALLED TO, READ BACK BY AND VERIFIED WITH:  TO RLIGHT(RN) BY TCLEVELAND 12/01/2015 AT 9:19PM   Culture, Urine     Status: Abnormal (Preliminary result)   Collection Time: 12/02/15  5:03 AM  Result Value Ref Range Status   Specimen Description URINE, RANDOM  Final   Special Requests NONE  Final   Culture >=100,000 COLONIES/mL ESCHERICHIA COLI (A)  Final   Report Status PENDING  Incomplete     Invalid input(s): PROCALCITONIN, LACTICACIDVEN   Radiology Studies: Chest Portable 1 View  Result Date: 12/01/2015 CLINICAL DATA:  Known hip fracture. Pre-op image of chest. Pt denies all chest complaints at this time. Hx HTN controlled with medication and multiple heart and lung conditions. EXAM: PORTABLE CHEST 1 VIEW COMPARISON:  05/20/2015 FINDINGS: Cardiac silhouette is mildly enlarged. No mediastinal or hilar masses.  No evidence of adenopathy. Clear lungs.  No pleural effusion or pneumothorax. Bony thorax is demineralized. There is an old healed rib fracture on the left. IMPRESSION: No acute cardiopulmonary disease. Electronically Signed   By: Lajean Manes M.D.   On: 12/01/2015 15:35   Dg Abd Portable 1v  Result Date: 12/02/2015 CLINICAL DATA:  Nausea and vomiting postoperatively. EXAM: PORTABLE ABDOMEN - 1 VIEW COMPARISON:  CT abdomen pelvis 05/20/2015. FINDINGS: Gaseous distension of small bowel and colon. No unexpected radiopaque calculi. IMPRESSION: Bowel-gas  pattern suggests a postoperative ileus. Electronically Signed   By: Lorin Picket M.D.   On: 12/02/2015 16:35   Dg C-arm 1-60 Min  Result Date: 12/02/2015 CLINICAL DATA:  Left femoral intramedullary nailing up intratrochanteric fracture EXAM: LEFT FEMUR 2 VIEWS; DG C-ARM 61-120 MIN COMPARISON:  12/01/2015 FINDINGS: 44.1 seconds of fluoroscopic time utilized. Fine bony detail is limited by the seen on fluoroscopic technique. There is intertrochanteric fracture traversed by an intra medullary nail partially imaged on the AP and frogleg views. The distal aspect of the femoral component is excluded. Near-anatomic alignment is seen. No definite hardware failure. Intertrochanteric fracture line is seen with mild adjacent to the lesser trochanter. IMPRESSION: Intra medullary femoral nailing of left femoral intertrochanteric fracture. Electronically Signed   By: Ashley Royalty M.D.   On: 12/02/2015 23:47   Dg Hip Unilat With Pelvis 2-3 Views Left  Result Date: 12/01/2015 CLINICAL DATA:  75 year old female with history of trauma from a fall in the bathroom earlier today complaining of left-sided hip pain. EXAM: DG HIP (WITH OR WITHOUT PELVIS) 2-3V LEFT COMPARISON:  No priors. FINDINGS: Minimally displaced intertrochanteric left hip fracture. Left hip is located. No acute displaced fracture of the bony pelvis. Postoperative changes of gamma nail fixation are noted in  the right femoral neck where there is a healed intertrochanteric hip fracture. IMPRESSION: 1. Nondisplaced intertrochanteric hip fracture of the left hip. Electronically Signed   By: Vinnie Langton M.D.   On: 12/01/2015 14:32   Dg Femur Min 2 Views Left  Result Date: 12/02/2015 CLINICAL DATA:  Left femoral intramedullary nailing up intratrochanteric fracture EXAM: LEFT FEMUR 2 VIEWS; DG C-ARM 61-120 MIN COMPARISON:  12/01/2015 FINDINGS: 44.1 seconds of fluoroscopic time utilized. Fine bony detail is limited by the seen on fluoroscopic technique. There is intertrochanteric fracture traversed by an intra medullary nail partially imaged on the AP and frogleg views. The distal aspect of the femoral component is excluded. Near-anatomic alignment is seen. No definite hardware failure. Intertrochanteric fracture line is seen with mild adjacent to the lesser trochanter. IMPRESSION: Intra medullary femoral nailing of left femoral intertrochanteric fracture. Electronically Signed   By: Ashley Royalty M.D.   On: 12/02/2015 23:47        Scheduled Meds: . cefTRIAXone (ROCEPHIN)  IV  1 g Intravenous Q24H  . Chlorhexidine Gluconate Cloth  6 each Topical Q0600  . docusate sodium  100 mg Oral BID  . escitalopram  20 mg Oral Daily  . insulin aspart  0-15 Units Subcutaneous TID WC  . insulin glargine  10 Units Subcutaneous Daily  . metoprolol succinate  25 mg Oral Daily  . mupirocin ointment  1 application Nasal BID  . phenytoin  200 mg Oral BID  . Warfarin - Pharmacist Dosing Inpatient   Does not apply q1800   Continuous Infusions: . sodium chloride    . sodium chloride 0.9 % 1,000 mL with potassium chloride 20 mEq infusion 75 mL/hr at 12/03/15 0422     LOS: 2 days    Time spent: 35 minutes    Sebert Stollings A, MD [facility] Pager [phone]  If 7PM-7AM, please contact night-coverage [email or web address] Password [email or web address] 12/03/2015, 11:35 AM

## 2015-12-03 NOTE — Op Note (Signed)
NAMEFLEETA, KUNDE             ACCOUNT NO.:  [id number]  MEDICAL RECORD NO.:  [id number]  LOCATION:  5N05C                        FACILITY:  [facility]  PHYSICIAN:  Mark C. Lorin Mercy, M.D.    DATE OF BIRTH:  [birth year]  DATE OF PROCEDURE:  12/02/2015 DATE OF DISCHARGE:                              OPERATIVE REPORT   PREOPERATIVE DIAGNOSIS:  Left intertrochanteric fracture.  POSTOPERATIVE DIAGNOSIS:  Left intertrochanteric fracture.  PROCEDURE:  Left trochanteric nail, Biomet FX 360 x 11 mm with 100 mm lag screw.  ESTIMATED BLOOD LOSS:  100 mL.  DRAINS:  None.  COMPLICATIONS:  None.  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  DuraPrep was used. The C-arm was brought in, time-out procedure was completed.  Area was squared with towels.  Large shower curtain, Betadine, Steri-Drape were applied.  Incision was made proximal to trochanter.  Tip was palpated. A K-wire was drilled and then checked under C-arm AP and lateral over reamed using the awl, passing the beaded tip rod down to the knee, reaming once with a 13 and then passing the 11 x 360 based on measurements.  There was good cortical fit through the cortex rotational stability and proximal interlock was placed with the pin center-center, 1 cm from the joint surface.  Good position alignment was tightened down, compress, locked down and then 1 x-rays were confirmed.  The side arm was removed, irrigation and closure with deep fascia with #1 Vicryl, 2-0 in the subcutaneous tissue, skin and staple closure.  The patient had multiple reactions to Novocain including anaphylaxis, so no Marcaine was placed in the incision.  Skin staple closure, postop dressing and transferred to recovery room.     Mark C. Lorin Mercy, M.D.     MCY/MEDQ  D:  12/02/2015  T:  12/03/2015  Job:  [id number]

## 2015-12-03 NOTE — Progress Notes (Signed)
Pt declined ambien.

## 2015-12-03 NOTE — Anesthesia Postprocedure Evaluation (Signed)
Anesthesia Post Note  Patient: [name] [name]  Procedure(s) Performed: Procedure(s) (LRB): INTRAMEDULLARY (IM) NAIL FEMORAL (Left)  Patient location during evaluation: PACU Anesthesia Type: General Level of consciousness: sedated Pain management: pain level controlled Vital Signs Assessment: post-procedure vital signs reviewed and stable Respiratory status: spontaneous breathing and respiratory function stable Cardiovascular status: stable Anesthetic complications: no                 Blayne Garlick DANIEL

## 2015-12-03 NOTE — Progress Notes (Addendum)
Inpatient Diabetes Program Recommendations  AACE/ADA: New Consensus Statement on Inpatient Glycemic Control (2015)  Target Ranges:  Prepandial:   less than 140 mg/dL      Peak postprandial:   less than 180 mg/dL (1-2 hours)      Critically ill patients:  140 - 180 mg/dL   Lab Results  Component Value Date   GLUCAP 333 (H) 12/03/2015   HGBA1C 7.3 (H) 12/02/2015   Review of blood sugars Results for [name] [name], [name] [name] (MRN [id number]) as of 12/03/2015 07:53  Ref. Range 12/02/2015 12:02 12/02/2015 16:11 12/02/2015 21:49 12/03/2015 02:02 12/03/2015 06:50  Glucose-Capillary Latest Ref Range: 65 - 99 mg/dL 207 (H) 234 (H) 241 (H) 340 (H) 333 (H)    Diabetes history: Type 2 diabetes Outpatient Diabetes medications: Novolin 70/30 30 units in the AM, 14 units with lunch, and 14 units with supper  Current orders for Inpatient glycemic control: Novolog moderate tid with meals, Lantus 10 units daily  Inpatient Diabetes Program Recommendations:   Please consider increasing Lantus to 25 units daily based on home dose of 70/30.  (fasting blood sugar 333mg /dl)  Unable to determine oral intake amounts- if the patient is eating, consider  Novolog meal coverage 5 units of Novolog tid.   If the patient intake is poor, consider changing Novolog moderate correction to q4h.    Gentry Fitz, RN, BA, MHA, CDE Diabetes Coordinator Inpatient Diabetes Program  [phone] (Team Pager) [phone] ([place]) 12/03/2015 7:58 AM

## 2015-12-04 ENCOUNTER — Inpatient Hospital Stay (HOSPITAL_COMMUNITY): Payer: Medicare Other

## 2015-12-04 LAB — URINE CULTURE

## 2015-12-04 LAB — GLUCOSE, CAPILLARY
GLUCOSE-CAPILLARY: 248 mg/dL — AB (ref 65–99)
GLUCOSE-CAPILLARY: 262 mg/dL — AB (ref 65–99)
GLUCOSE-CAPILLARY: 215 mg/dL — AB (ref 65–99)
Glucose-Capillary: 142 mg/dL — ABNORMAL HIGH (ref 65–99)

## 2015-12-04 LAB — BASIC METABOLIC PANEL
Anion gap: 7 (ref 5–15)
BUN: 27 mg/dL — AB (ref 6–20)
CHLORIDE: 98 mmol/L — AB (ref 101–111)
CO2: 27 mmol/L (ref 22–32)
Calcium: 8 mg/dL — ABNORMAL LOW (ref 8.9–10.3)
Creatinine, Ser: 1.79 mg/dL — ABNORMAL HIGH (ref 0.44–1.00)
GFR calc Af Amer: 31 mL/min — ABNORMAL LOW (ref >60)
GFR calc non Af Amer: 27 mL/min — ABNORMAL LOW (ref >60)
GLUCOSE: 260 mg/dL — AB (ref 65–99)
POTASSIUM: 4.2 mmol/L (ref 3.5–5.1)
Sodium: 132 mmol/L — ABNORMAL LOW (ref 135–145)

## 2015-12-04 LAB — CBC
HCT: 22.9 % — ABNORMAL LOW (ref 36.0–46.0)
HEMOGLOBIN: 7.5 g/dL — AB (ref 12.0–15.0)
MCH: 30.7 pg (ref 26.0–34.0)
MCHC: 32.8 g/dL (ref 30.0–36.0)
MCV: 93.9 fL (ref 78.0–100.0)
Platelets: 215 10*3/uL (ref 150–400)
RBC: 2.44 MIL/uL — AB (ref 3.87–5.11)
RDW: 13.5 % (ref 11.5–15.5)
WBC: 11.5 10*3/uL — ABNORMAL HIGH (ref 4.0–10.5)

## 2015-12-04 LAB — PROTIME-INR
INR: 1.41
PROTHROMBIN TIME: 17.4 s — AB (ref 11.4–15.2)

## 2015-12-04 LAB — PHENYTOIN LEVEL, TOTAL: PHENYTOIN LVL: 6.3 ug/mL — AB (ref 10.0–20.0)

## 2015-12-04 MED ORDER — METHOCARBAMOL 500 MG PO TABS: 500.0000 mg | ORAL_TABLET | Freq: Four times a day (QID) | ORAL | 0 refills | Status: DC | PRN

## 2015-12-04 MED ORDER — OXYCODONE-ACETAMINOPHEN 5-325 MG PO TABS: 1.0000 | ORAL_TABLET | Freq: Four times a day (QID) | ORAL | 0 refills | Status: DC | PRN

## 2015-12-04 MED ORDER — WARFARIN SODIUM 5 MG PO TABS
2.5000 mg | ORAL_TABLET | Freq: Once | ORAL | Status: AC
  Administered 2015-12-04: 2.5 mg via ORAL
  Filled 2015-12-04: qty 1

## 2015-12-04 MED ORDER — BISACODYL 10 MG RE SUPP
10.0000 mg | Freq: Once | RECTAL | Status: AC
  Administered 2015-12-04: 10 mg via RECTAL
  Filled 2015-12-04: qty 1

## 2015-12-04 MED ORDER — MAGNESIUM CITRATE PO SOLN
0.5000 | Freq: Once | ORAL | Status: AC
  Administered 2015-12-04: 0.5 via ORAL
  Filled 2015-12-04: qty 296

## 2015-12-04 NOTE — Progress Notes (Addendum)
Inpatient Diabetes Program Recommendations  AACE/ADA: New Consensus Statement on Inpatient Glycemic Control (2015)  Target Ranges:  Prepandial:   less than 140 mg/dL      Peak postprandial:   less than 180 mg/dL (1-2 hours)      Critically ill patients:  140 - 180 mg/dL   Lab Results  Component Value Date   GLUCAP 248 (H) 12/04/2015   HGBA1C 7.3 (H) 12/02/2015   Results for DEAYSIA, GRIGORYAN (MRN [id number]) as of 12/04/2015 10:53  Ref. Range 12/03/2015 06:50 12/03/2015 12:15 12/03/2015 16:44 12/03/2015 22:37 12/04/2015 06:48  Glucose-Capillary Latest Ref Range: 65 - 99 mg/dL 333 (H) 285 (H) 269 (H) 215 (H) 248 (H)    Diabetes history: Type 2 diabetes Outpatient Diabetes medications: Novolin 70/30 30 units in the AM, 14 units with lunch, and 14 units with supper Current orders for Inpatient glycemic control:  Novolog moderate tid with meals, Lantus 20 units daily  Inpatient Diabetes Program Recommendations:   Please consider increasing Lantus to 25 units daily based on home dose of 70/30. Once eating, would likely benefit from the addition of Novolog meal coverage 5 units tid with meals.     Thanks, Adah Perl, RN, BC-ADM Inpatient Diabetes Coordinator Pager [phone] (8a-5p)

## 2015-12-04 NOTE — Progress Notes (Signed)
Physical Therapy Treatment Patient Details Name: [name] [name] MRN: [id number] DOB: [birth year] Today's Date: 12/04/2015    History of Present Illness pt presents with L Femur fx s/p IM Nail.  pt with recent Bil Ankle fxs and hx of HTN, Seizure, Anxiety, Depression, DM, A-fib, CHF, CAD, Herpes, and L eye blindness.      PT Comments    Pt performed increased mobility and progress to supine exercises and short gait trial.  Pt will continue to benefit from SNF placement for continued rehab before returning home.    Follow Up Recommendations  SNF     Equipment Recommendations  None recommended by PT    Recommendations for Other Services       Precautions / Restrictions Precautions Precautions: Fall Precaution Comments: Per pt report she is WBAT on Bil ankles and is supposed to be wearing a "boot" and alternating between wearing it on  Restrictions Weight Bearing Restrictions: Yes RLE Weight Bearing: Weight bearing as tolerated LLE Weight Bearing: Weight bearing as tolerated    Mobility  Bed Mobility Overal bed mobility: Needs Assistance Bed Mobility: Supine to Sit     Supine to sit: Mod assist;+2 for safety/equipment     General bed mobility comments: Pt required cues for sequencing and assist with LLE to edge of bed, Pt then required assist to scoot to edge of bed with bed pad.    Transfers Overall transfer level: Needs assistance Equipment used: Rolling walker (2 wheeled);2 person hand held assist Transfers: Sit to/from Stand Sit to Stand: Mod assist;+2 physical assistance         General transfer comment: Cues for hand placement to push from seated surface with RUE.  Pt required cues for sequencing and extension of knees/hips and trunk.    Ambulation/Gait Ambulation/Gait assistance: Supervision;Min assist;+2 physical assistance Ambulation Distance (Feet): 6 Feet Assistive device: Rolling walker (2 wheeled) Gait Pattern/deviations: Trunk  flexed;Shuffle;Antalgic;Step-to pattern   Gait velocity interpretation: Below normal speed for age/gender General Gait Details: Cues for sequencing to lead with surgical extremity.  Cues for weight shifting and assist to advance RW forward.  Pt with anxiety to progress mobility.     Stairs            Wheelchair Mobility    Modified Rankin (Stroke Patients Only)       Balance Overall balance assessment: Needs assistance   Sitting balance-Leahy Scale: Poor       Standing balance-Leahy Scale: Zero                      Cognition Arousal/Alertness: Awake/alert Behavior During Therapy: WFL for tasks assessed/performed Overall Cognitive Status: Within Functional Limits for tasks assessed                      Exercises Total Joint Exercises Ankle Circles/Pumps: AROM;Both;10 reps;Supine Quad Sets: AROM;Left;10 reps;Supine Heel Slides: AAROM;Left;10 reps;Supine Hip ABduction/ADduction: AAROM;Left;10 reps;Supine Long Arc Quad: AROM;Left;10 reps;Supine    General Comments        Pertinent Vitals/Pain Pain Assessment: Faces Faces Pain Scale: Hurts even more Pain Location: LLE Pain Descriptors / Indicators: Grimacing;Guarding Pain Intervention(s): Monitored during session;Repositioned;Ice applied    Home Living                      Prior Function            PT Goals (current goals can now be found in the care plan section) Acute Rehab PT  Goals Patient Stated Goal: Pt is now agreeable to rehab after speaking with MD.   Potential to Achieve Goals: Fair Progress towards PT goals: Progressing toward goals    Frequency    Min 5X/week      PT Plan Current plan remains appropriate    Co-evaluation PT/OT/SLP Co-Evaluation/Treatment: Yes Reason for Co-Treatment: Complexity of the patient's impairments (multi-system involvement) PT goals addressed during session: Mobility/safety with mobility;Strengthening/ROM OT goals addressed  during session: ADL's and self-care     End of Session Equipment Utilized During Treatment: Gait belt Activity Tolerance: Patient limited by pain Patient left: in chair;with call bell/phone within reach;with nursing/sitter in room     Time: 1130-1205 PT Time Calculation (min) (ACUTE ONLY): 35 min  Charges:  $Therapeutic Activity: 8-22 mins                    G Codes:      Cristela Blue January 03, 2016, 12:37 PM  Governor Rooks, PTA pager [phone]

## 2015-12-04 NOTE — Progress Notes (Signed)
Subjective: Patient c/o nausea, abd discomfort. No bowel movement since last Saturday. Some flatus.  Hip doing ok.  Moving slow with PT. Only bed to chair. Denies fatigue, dizziness.    Objective: Vital signs in last 24 hours: Temp:  [98.3 F (36.8 C)-99.2 F (37.3 C)] 98.3 F (36.8 C) (10/04 0300) Pulse Rate:  [79-82] 80 (10/04 0300) Resp:  [17] 17 (10/04 0300) BP: (114-150)/(40-62) 150/62 (10/04 0300) SpO2:  [92 %-96 %] 96 % (10/04 0300) Weight:  [93.7 kg (206 lb 8 oz)] 93.7 kg (206 lb 8 oz) (10/04 0500)  Intake/Output from previous day: 10/03 0701 - 10/04 0700 In: 1795.8 [P.O.:240; I.V.:1555.8] Out: -  Intake/Output this shift: No intake/output data recorded.   Recent Labs  12/01/15 1343 12/02/15 0328 12/03/15 0453 12/04/15 0516  HGB 10.8* 8.9* 8.3* 7.5*    Recent Labs  12/03/15 0453 12/04/15 0516  WBC 16.0* 11.5*  RBC 2.73* 2.44*  HCT 25.6* 22.9*  PLT 232 215    Recent Labs  12/03/15 0453 12/04/15 0516  NA 135 132*  K 3.9 4.2  CL 97* 98*  CO2 27 27  BUN 21* 27*  CREATININE 1.76* 1.79*  GLUCOSE 330* 260*  CALCIUM 8.1* 8.0*    Recent Labs  12/03/15 0453 12/04/15 0516  INR 1.27 1.41    Exam:  Alert and oriented.  abd somewhat distended and diffusely tender.  aquacel dressing intact.  bilat calves nontender   Assessment/Plan: Will get stat kub this morning to r/o ileus.  Patient agreeable to transfer to [facility] when bed available and medically stable.  Dulcolax supp now.  Hgb 7.5 and patient asymptomatic. Will monitor. Question needing transfusion PRBC's.    Ronee Ranganathan M 12/04/2015, 8:45 AM

## 2015-12-04 NOTE — Progress Notes (Signed)
[facility] for Warfarin Indication: VTE prophylaxis  Allergies  Allergen Reactions  . Procaine Hcl Anaphylaxis    Patient Measurements: Height: 5\' 9"  (175.3 cm) Weight: 206 lb 8 oz (93.7 kg) IBW/kg (Calculated) : 66.2  Vital Signs: Temp: 98.1 F (36.7 C) (10/04 1243) Temp Source: Oral (10/04 1243) BP: 154/62 (10/04 1243) Pulse Rate: 73 (10/04 1243)  Labs:  Recent Labs  12/02/15 0328  12/02/15 1759 12/03/15 0453 12/04/15 0516  HGB 8.9*  --   --  8.3* 7.5*  HCT 28.2*  --   --  25.6* 22.9*  PLT 273  --   --  232 215  LABPROT  --   < > 18.9* 16.0* 17.4*  INR  --   < > 1.57 1.27 1.41  CREATININE 1.64*  --   --  1.[name]* 1.79*  < > = values in this interval not displayed.  Estimated Creatinine Clearance: 34.1 mL/min (by C-G formula based on SCr of 1.79 mg/dL (H)).   Medical History: Past Medical History:  Diagnosis Date  . Atrial fibrillation ([place])    SVT dx 2007, cath 2007 mild CAD, then had an cardioversion, ablation; still on coumadin , has occ palpitation, EKG 03-2010 NSR  . Blindness of left eye   . Common migraine    ?hx of  . DIABETES MELLITUS, TYPE II 03/27/2006   dr [name]  . Eye muscle weakness    Right eye weakness after cataract surgery  . GERD (gastroesophageal reflux disease) 10/05/2011  . Headache(784.0) 05/07/2009  . 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 HYPERLIPIDEMIA 03/27/2006  . HYPERTENSION 03/27/2006  . Intertrochanteric fracture of right hip ([place]) 07/13/2012  . 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 Osteopenia 2004   Dexa 2004 showed Osteopenia, DEXA 03/2007 normal  . Osteopenia   . Other chronic cystitis with hematuria   . Recurrent urinary tract infection    Seeing Urology  . 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 Seizures ([place])   . 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[place]) 8/67/6720    Medications: see medical record  Assessment: 75 y.o. [name] [name] s/p L hip fracture/repair for VTE prophylaxis.  Pt with h/o Afib on warfarin  2.5 mg daily PTA, Vit K 5 mg IV given 10/2 for supratherapeutic INR.  Effects of vitamin K likely to hang around a few days.   Today's INR 1.41.  Goal of Therapy:  INR 2-3 Monitor platelets by anticoagulation protocol: Yes   Plan:  Warfarin 2.5 mg x 1 Monitor daily INR.  [name] [name] [name], BCPS  Clinical Pharmacist Pager [phone]  12/04/2015 2:43 PM

## 2015-12-04 NOTE — NC FL2 (Signed)
[place] LEVEL OF CARE SCREENING TOOL     IDENTIFICATION  Patient Name: [name] [name] Birthdate: 06-29-1940 Sex: female Admission Date (Current Location): 12/01/2015  [facility] and [place] Number:  Herbalist and Address:  [facility], [place] [address], [place], [place] [address]      Provider Number: [id number]  Attending Physician Name and Address:  Verlee Monte, MD  Relative Name and Phone Number:       Current Level of Care: SNF Recommended Level of Care: [place] Prior Approval Number:    Date Approved/Denied:   PASRR Number:    Discharge Plan:      Current Diagnoses: Patient Active Problem List   Diagnosis Date Noted  . Hip fracture ([place]) 12/01/2015  . Anemia 12/01/2015  . Leukocytosis 12/01/2015  . Ankle fracture, left 09/17/2015  . Ankle fracture, bimalleolar, closed   . Bilateral fibular fractures 09/14/2015  . Hypertensive heart disease 05/20/2015  . CAD (coronary artery disease) 05/20/2015  . Ischemic cardiomyopathy 05/20/2015  . Acute systolic CHF (congestive heart failure) ([place]) 05/20/2015  . PAF (paroxysmal atrial fibrillation) ([place]) 05/20/2015  . Anticoagulated on Coumadin 05/20/2015  . Type 2 diabetes mellitus with vascular disease ([place]) 05/17/2015  . NSTEMI (non-ST elevated myocardial infarction) ([place])   . Bacteremia due to Escherichia coli 05/11/2015  . Acute kidney injury superimposed on chronic kidney disease ([place]) 05/11/2015  . Uncontrolled hypertension 05/11/2015  . 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[place]) 77/41/2878  . Atrial fibrillation with RVR ([place]) 05/06/2015  . PCP NOTES >>>>>>>>>>>>>>>> 12/28/2014  . Depression 05/17/2014  . Memory loss 05/17/2014  . Diabetes mellitus-PCP comments 01/23/2014  . A-fib ([place]) 01/23/2014  . Anxiety and depression, PCP notes  11/24/2013  . Severe obesity (BMI >= 40) ([place]) 07/14/2013  . Encounter for therapeutic drug monitoring 06/02/2013  . Dilantin  toxicity 11/21/2012  . History of encephalitis 11/18/2012  . Localization-related (focal) (partial) epilepsy and epileptic syndromes with complex partial seizures, without mention of intractable epilepsy 09/27/2012  . Hyponatremia 07/16/2012  . Intertrochanteric fracture of right hip ([place]) 07/13/2012  . DM (diabetes mellitus) ([place]) 01/19/2012  . GERD (gastroesophageal reflux disease) 10/05/2011  . Shoulder pain 08/27/2011  . Seizure disorder ([place]) 05/24/2011  . UTI (lower urinary tract infection) 05/23/2011  . UTI (urinary tract infection) 05/22/2011  . Annual physical exam 02/10/2011  . Long term (current) use of anticoagulants 09/23/2010  . Atrial fibrillation ([place])   . 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 Hyperlipidemia 03/27/2006  . PERIPHERAL NEUROPATHY 03/27/2006  . 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 Essential hypertension 03/27/2006  . OSTEOPENIA 03/27/2006    Orientation RESPIRATION BLADDER Height & Weight     Self, Situation, Place, Time  Normal Continent Weight: 206 lb 8 oz (93.7 kg) Height:  5\' 9"  (175.3 cm)  BEHAVIORAL SYMPTOMS/MOOD NEUROLOGICAL BOWEL NUTRITION STATUS      Continent    AMBULATORY STATUS COMMUNICATION OF NEEDS Skin   Limited Assist Verbally Normal                       Personal Care Assistance Level of Assistance  Bathing, Dressing Bathing Assistance: Limited assistance   Dressing Assistance: Limited assistance     Functional Limitations Info             SPECIAL CARE FACTORS FREQUENCY                       Contractures  Additional Factors Info                  Current Medications (12/04/2015):  This is the current hospital active medication list Current Facility-Administered Medications  Medication Dose Route Frequency Provider Last Rate Last Dose  . 0.45 % sodium chloride infusion   Intravenous Continuous [name], MD 10 mL/hr at 12/03/15 1825    . acetaminophen (TYLENOL) tablet 650 mg  650 mg Oral Q6H PRN  [name], MD       Or  . acetaminophen (TYLENOL) suppository 650 mg  650 mg Rectal Q6H PRN [name], MD      . cefTRIAXone (ROCEPHIN) 1 g in dextrose 5 % 50 mL IVPB  1 g Intravenous Q24H Verlee Monte, MD   1 g at 12/04/15 1209  . Chlorhexidine Gluconate Cloth 2 % PADS 6 each  6 each Topical Q0600 [name], MD   6 each at 12/04/15 0600  . docusate sodium (COLACE) capsule 100 mg  100 mg Oral BID [name], MD   100 mg at 12/03/15 2104  . escitalopram (LEXAPRO) tablet 20 mg  20 mg Oral Daily Coralie Keens, MD   20 mg at 12/04/15 1055  . heparin injection 5,000 Units  5,000 Units Subcutaneous Q8H Verlee Monte, MD   5,000 Units at 12/04/15 1422  . HYDROcodone-acetaminophen (NORCO/VICODIN) 5-325 MG per tablet 1-2 tablet  1-2 tablet Oral Q6H PRN [name], MD   1 tablet at 12/04/15 1624  . insulin aspart (novoLOG) injection 0-15 Units  0-15 Units Subcutaneous TID WC Radene Gunning, NP   5 Units at 12/04/15 1715  . insulin glargine (LANTUS) injection 20 Units  20 Units Subcutaneous Daily Verlee Monte, MD   20 Units at 12/04/15 1058  . LORazepam (ATIVAN) tablet 0.5 mg  0.5 mg Oral BID PRN Lezlie Octave Black, NP      . menthol-cetylpyridinium (CEPACOL) lozenge 3 mg  1 lozenge Oral PRN [name], MD       Or  . phenol (CHLORASEPTIC) mouth spray 1 spray  1 spray Mouth/Throat PRN [name], MD      . methocarbamol (ROBAXIN) tablet 500 mg  500 mg Oral Q6H PRN Radene Gunning, NP       Or  . methocarbamol (ROBAXIN) 500 mg in dextrose 5 % 50 mL IVPB  500 mg Intravenous Q6H PRN Radene Gunning, NP      . metoCLOPramide (REGLAN) tablet 5-10 mg  5-10 mg Oral Q8H PRN [name], MD       Or  . metoCLOPramide (REGLAN) injection 5-10 mg  5-10 mg Intravenous Q8H PRN [name], MD      . metoprolol succinate (TOPROL-XL) 24 hr tablet 25 mg  25 mg Oral Daily Mcarthur Rossetti, MD   25 mg at 12/04/15 1056  . mupirocin ointment (BACTROBAN) 2 % 1 application  1 application Nasal BID [name],  MD   1 application at 86/57/84 1059  . ondansetron (ZOFRAN) tablet 4 mg  4 mg Oral Q6H PRN [name], MD       Or  . ondansetron [facility]) injection 4 mg  4 mg Intravenous Q6H PRN [name], MD   4 mg at 12/04/15 1424  . phenytoin (DILANTIN) ER capsule 200 mg  200 mg Oral BID Elwin Mocha, MD   200 mg at 12/04/15 1058  . Warfarin - Pharmacist Dosing  Inpatient   Does not apply q1800 Verlee Monte, MD      . zolpidem (AMBIEN) tablet 5 mg  5 mg Oral QHS PRN Radene Gunning, NP         Discharge Medications: Please see discharge summary for a list of discharge medications.  Relevant Imaging Results:  Relevant Lab Results:   Additional Information    [name] [name], [place] R

## 2015-12-04 NOTE — Clinical Social Work Note (Signed)
Clinical Social Work Assessment  Patient Details  Name: [name] [name] MRN: [id number] Date of Birth: [birth year]  Date of referral:  12/04/15               Reason for consult:  Facility Placement                Permission sought to share information with:   Dustin Flock) Permission granted to share information::   Dustin Flock)  Name::        Agency::     Relationship::     Contact Information:     Housing/Transportation Living arrangements for the past 2 months:  Single Family Home (Patient states that she lives at home in [place] with her husband. ) Source of Information:  Patient Patient Interpreter Needed:  None Criminal Activity/Legal Involvement Pertinent to Current Situation/Hospitalization:  No - Comment as needed Significant Relationships:  Spouse Lives with:  Spouse Do you feel safe going back to the place where you live?   (Patient states that she would like to go to [facility]) Need for family participation in patient care:  No (Coment)  Care giving concerns:  Patient states that she now needs assistance with ADLs due to her falling and breaking her L hip. SW will refer pt to Dustin Flock.   Social Worker assessment / plan:  SW has referred pt to [employer]. SW met with pt at bedside. Patient was alert and oriented. There was no family present.   Employment status:  Retired Forensic scientist:  Medicare PT Recommendations:  [place] / Referral to community resources:   (SNF)  Patient/Family's Response to care:  Appropriate.   Patient/Family's Understanding of and Emotional Response to Diagnosis, Current Treatment, and Prognosis:  No questions.   Emotional Assessment Appearance:  Appears stated age (aPPROPRIATE.) Attitude/Demeanor/Rapport:   (Appropriate.) Affect (typically observed):  Accepting Orientation:  Oriented to Self, Oriented to Place, Oriented to  Time, Oriented to Situation Alcohol / Substance use:  Not  Applicable Psych involvement (Current and /or in the community):  No (Comment)  Discharge Needs  Concerns to be addressed:  No discharge needs identified Readmission within the last 30 days:  No Current discharge risk:  None Barriers to Discharge:  No Barriers Identified   Tilda Burrow R 12/04/2015, 7:03 PM

## 2015-12-04 NOTE — Discharge Instructions (Signed)
ORTHOPEDIC DISCHARGE INSTRUCTIONS  -OK TO SHOWER BUT NO TUB SOAKING.  DO NOT APPLY ANY CREAMS OR OINTMENTS ON INCISION.  -WEIGHTBEAR AS TOLERATED LEFT LOWER EXTREMITY WITH WALKER. -CONTINUE PHYSICAL THERAPY FOR MOBILIZATION

## 2015-12-04 NOTE — Evaluation (Signed)
Occupational Therapy Evaluation Patient Details Name: [name] [name] MRN: [id number] DOB: Apr 23, 1940 Today's Date: 12/04/2015    History of Present Illness pt presents with L Femur fx s/p IM Nail.  pt with recent Bil Ankle fxs and hx of HTN, Seizure, Anxiety, Depression, DM, A-fib, CHF, CAD, Herpes, and L eye blindness.     Clinical Impression   PTA pt getting assist with ADL from aide. Pt's husband performing IADL. Pt presenting with below deficits in self care tasks. Pt will benefit from skilled OT in acute care setting prior to d/c to SNF to maximize independence in ADL. OT to follow. Next session to focus on intro to AE and mobility prior to ADL (toilet transfers and clothing management/peri care)    Follow Up Recommendations  SNF    Equipment Recommendations  Other (comment) (To be recommended by next venue of care)    Recommendations for Other Services       Precautions / Restrictions Precautions Precautions: Fall Precaution Comments: Per pt report she is WBAT on Bil ankles and is supposed to be wearing a "boot" and alternating between wearing it on  Restrictions Weight Bearing Restrictions: Yes RLE Weight Bearing: Weight bearing as tolerated LLE Weight Bearing: Weight bearing as tolerated      Mobility Bed Mobility Overal bed mobility: Needs Assistance Bed Mobility: Supine to Sit Rolling: Mod assist Sidelying to sit: Max assist Supine to sit: Mod assist;+2 for safety/equipment     General bed mobility comments: Pt required cues for sequencing and assist with LLE to edge of bed, Pt then required assist to scoot to edge of bed with bed pad.    Transfers Overall transfer level: Needs assistance Equipment used: Rolling walker (2 wheeled);2 person hand held assist Transfers: Sit to/from Stand Sit to Stand: Mod assist;+2 physical assistance         General transfer comment: Cues for hand placement to push from seated surface with RUE.  Pt required cues for  sequencing and extension of knees/hips and trunk.      Balance Overall balance assessment: Needs assistance   Sitting balance-Leahy Scale: Poor       Standing balance-Leahy Scale: Zero                              ADL Overall ADL's : Needs assistance/impaired Eating/Feeding: Set up;Sitting   Grooming: Wash/dry face;Brushing hair;Set up;Sitting Grooming Details (indicate cue type and reason): in recliner after transfer     Lower Body Bathing: Maximal assistance;Sitting/lateral leans Lower Body Bathing Details (indicate cue type and reason): Pt can only reach 1/4 way down shins     Lower Body Dressing: Maximal assistance;Sitting/lateral leans Lower Body Dressing Details (indicate cue type and reason): Pt unable to doff/don socks Toilet Transfer: +2 for physical assistance;Moderate assistance;Cueing for safety;Cueing for sequencing;Ambulation;RW Toilet Transfer Details (indicate cue type and reason): simulated with recliner. Pt stood, walked a few steps and the recliner was brought up behind her.         Functional mobility during ADLs: Moderate assistance;+2 for physical assistance;+2 for safety/equipment;Rolling walker       Vision     Perception     Praxis      Pertinent Vitals/Pain Pain Assessment: Faces Faces Pain Scale: Hurts even more Pain Location: LLE Pain Descriptors / Indicators: Grimacing;Guarding Pain Intervention(s): Monitored during session;Repositioned;Ice applied     Hand Dominance     Extremity/Trunk Assessment Upper Extremity Assessment Upper  Extremity Assessment: Overall WFL for tasks assessed   Lower Extremity Assessment Lower Extremity Assessment: Generalized weakness;LLE deficits/detail LLE Deficits / Details: Sensation intact, very limited strength due to pain.     Cervical / Trunk Assessment Cervical / Trunk Assessment: Kyphotic   Communication Communication Communication: No difficulties   Cognition  Arousal/Alertness: Awake/alert Behavior During Therapy: WFL for tasks assessed/performed Overall Cognitive Status: Within Functional Limits for tasks assessed                     General Comments       Exercises       Shoulder Instructions      Home Living Family/patient expects to be discharged to:: Skilled nursing facility                                 Additional Comments: previously with PT eval PT had been adament about d/c home, today Pt is agreeable to go to SNF      Prior Functioning/Environment Level of Independence: Needs assistance  Gait / Transfers Assistance Needed: Only ambulates short distances and working with [place]. ADL's / Homemaking Assistance Needed: Husband performs homemaking tasks and pt has an aide to A with ADLs.              OT Problem List: Decreased strength;Decreased range of motion;Decreased activity tolerance;Impaired balance (sitting and/or standing);Decreased knowledge of use of DME or AE;Obesity;Pain   OT Treatment/Interventions: Self-care/ADL training;DME and/or AE instruction;Patient/family education;Balance training;Therapeutic activities    OT Goals(Current goals can be found in the care plan section) Acute Rehab OT Goals Patient Stated Goal: Pt is now agreeable to rehab after speaking with MD.   OT Goal Formulation: With patient Time For Goal Achievement: 12/11/15 Potential to Achieve Goals: Good ADL Goals Pt Will Perform Grooming: with supervision;sitting Pt Will Perform Lower Body Dressing: with min guard assist;sitting/lateral leans Pt Will Transfer to Toilet: with min guard assist;bedside commode;ambulating Pt Will Perform Toileting - Clothing Manipulation and hygiene: with modified independence;sit to/from stand  OT Frequency: Min 2X/week   Barriers to D/C: Decreased caregiver support          Co-evaluation PT/OT/SLP Co-Evaluation/Treatment: Yes Reason for Co-Treatment: Complexity of the patient's  impairments (multi-system involvement) PT goals addressed during session: Mobility/safety with mobility OT goals addressed during session: ADL's and self-care      End of Session Equipment Utilized During Treatment: Gait belt;Rolling walker Nurse Communication: Mobility status (Pt with maxi move pad underneath for nursing)  Activity Tolerance: Patient limited by fatigue Patient left: in chair;with call bell/phone within reach   Time: 1139-1208 OT Time Calculation (min): 29 min Charges:  OT General Charges $OT Visit: 1 Procedure OT Evaluation $OT Eval Moderate Complexity: 1 Procedure G-Codes:    Merri Ray Lemmie Steinhaus 12-10-2015, 4:51 PM Hulda Humphrey OTR/L [phone]

## 2015-12-04 NOTE — Progress Notes (Signed)
[facility] NOTE    Pharmacy Consult for Phenytoin Indication: Seizure history  Allergies  Allergen Reactions  . Procaine Hcl Anaphylaxis    Patient Measurements: Height: 5\' 9"  (175.3 cm) Weight: 206 lb 8 oz (93.7 kg) IBW/kg (Calculated) : 66.2  Vital Signs: Temp: 98.1 F (36.7 C) (10/04 1243) Temp Source: Oral (10/04 1243) BP: 154/62 (10/04 1243) Pulse Rate: 73 (10/04 1243) Intake/Output from previous day: 10/03 0701 - 10/04 0700 In: 1795.8 [P.O.:240; I.V.:1555.8] Out: -  Intake/Output from this shift: Total I/O In: 240 [P.O.:240] Out: -   Labs:  Recent Labs  12/02/15 0328 12/03/15 0453 12/04/15 0516  WBC 14.2* 16.0* 11.5*  HGB 8.9* 8.3* 7.5*  HCT 28.2* 25.6* 22.9*  PLT 273 232 215  CREATININE 1.64* 1.76* 1.79*   Estimated Creatinine Clearance: 34.1 mL/min (by C-G formula based on SCr of 1.79 mg/dL (H)).  Lab Results  Component Value Date   PHENYTOIN 6.3 (L) 12/04/2015    Assessment: 75 year old female on phenytoin PTA with low phenytoin level on admission, given bolus of phenytoin 500 mg IV x 1, and then continued on home dose of 200 mg BID.  Today's level = 6.3, corrects to 8.6 given albumin of 3.2.  Patient requested I call her neurologist office prior to dose adjustment.  Spoke to [facility] today, per their instruction will check free phenytoin level prior to making any adjustments.  Goal of Therapy:  Level = 10 to 20  Plan:  Free phenytoin level tonight at 2130 pm. Will adjust dose accordingly once lab available.  Uvaldo Rising, BCPS  Clinical Pharmacist Pager [phone]  12/04/2015 3:32 PM

## 2015-12-05 DIAGNOSIS — S72002D Fracture of unspecified part of neck of left femur, subsequent encounter for closed fracture with routine healing: Secondary | ICD-10-CM

## 2015-12-05 LAB — CBC
HEMATOCRIT: 22.9 % — AB (ref 36.0–46.0)
HEMOGLOBIN: 7.2 g/dL — AB (ref 12.0–15.0)
MCH: 29.3 pg (ref 26.0–34.0)
MCHC: 31.4 g/dL (ref 30.0–36.0)
MCV: 93.1 fL (ref 78.0–100.0)
Platelets: 273 10*3/uL (ref 150–400)
RBC: 2.46 MIL/uL — ABNORMAL LOW (ref 3.87–5.11)
RDW: 13.5 % (ref 11.5–15.5)
WBC: 9.1 10*3/uL (ref 4.0–10.5)

## 2015-12-05 LAB — GLUCOSE, CAPILLARY
GLUCOSE-CAPILLARY: 150 mg/dL — AB (ref 65–99)
GLUCOSE-CAPILLARY: 208 mg/dL — AB (ref 65–99)

## 2015-12-05 LAB — PROTIME-INR
INR: 1.86
Prothrombin Time: 21.7 seconds — ABNORMAL HIGH (ref 11.4–15.2)

## 2015-12-05 MED ORDER — WARFARIN SODIUM 5 MG PO TABS: 2.5000 mg | ORAL_TABLET | Freq: Once | ORAL | Status: DC

## 2015-12-05 NOTE — Discharge Summary (Signed)
Physician Discharge Summary  [name] [name] TIW:[id number] DOB: 1940/12/06 DOA: 12/01/2015  PCP: [name], MD  Admit date: 12/01/2015 Discharge date: 12/05/2015  Admitted From: Home Discharge disposition: SNF   Recommendations for Outpatient Follow-Up:   1. Close F/U of PT/INR recommended.   Discharge Diagnosis:   Principal Problem:    Hip fracture (HCC) Active Problems:    Essential hypertension    Seizure disorder (HCC)    GERD (gastroesophageal reflux disease)    Severe obesity (BMI >= 40) (HCC)    Anxiety and depression, PCP notes     Diabetes mellitus-PCP comments    A-fib ([place])    Acute kidney injury superimposed on chronic kidney disease (HCC)    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HCC)    CAD (coronary artery disease)    Ankle fracture, bimalleolar, closed    Ankle fracture, left    Anemia    Leukocytosis   Discharge Condition: Improved.  Diet recommendation: Low sodium, heart healthy.  Carbohydrate-modified.    History of Present Illness:   [name] [name] a pleasant 75 y.o.femalewith medical history significant for A. fib on Coumadin CAD Plavix, CHF, hypertension, diabetes, seizure disorder, recent UTI resulting in fall resulting in bilateral fibular fractures since emergency Department chief complaint of left hip pain after a fall. Initial evaluation reveals left hip fracture, acute onchronic kidney injury.  Information is obtained from the chart and the patient and she was in her usual state of health until this morning when she awakened ambulated to the bathroom with her blue boot on her right foot (she alternates legs in wearing blue boot) and she states "my left leg just gave way" and fell. She denies losing consciousness or hitting her head. Her husband came and tried to assist her up with it was too painful. EMS was called. Associated symptoms included mild lightheadedness recent vomiting. She reports she saw her PCP yesterday and obtain the flu  shot and feels like that made her nauseous she had one episode of emesis. She denies coffee ground emesis. She denies any chest pain palpitation,shortness of breath, headache syncope or near-syncope. She denies any fever chills cough recent travel or sick contacts.. She denies dysuria hematuria frequency or urgency.   Hospital Course by Problem:   Hip fracture related to mechanical fall in the setting of bilateral fibular fractures.  -X-ray reveals minimally displaced intertrochanteric left hip fracture acute displaced fracture of the bony pelvis. -Admit to telemetry -Pain management -Initial orthopedic recommendation to have surgery later today, patient INR is still elevated at 4.4. -Stat FFP's and INR after that.  Escherichia coli UTI -Currently on Rocephin, continue. We'll adjust antibiotic according to culture results.  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  -Baseline creatinine about 1.4 from July of this year. presented with Creatinine of 1.8. -Acute and Chronic Renal Failure, hydrated with IV fluids, check BMP in a.m.  Seizure disorder. Stable. Medications include Dilantin. Dilantin sub therapeutic.  -Dilantin dosing per pharmacy   Anemia. Likely of chronic disease. Hemoglobin 10.8 on admission. Chart review indicates this is close to baseline -Type and screen -Hb down to 8.3  Chronic systolic heart failure.  -Appears compensated. Recent echo reveals 35-40% EF. Home medications include Lasix, lisinopril, metoprolol. -Started on IV fluids, Cipro and Lasix held.  CAD/recent MI. No chest pain. EKG without acute changes.home meds include plavix -Hold Plavix for now  A. fib. Controlled. On Coumadin. Meds also include a beta blocker. INR 3.4 -CHA2DS2-VASc is at least 5 (  HTN/DM/age/CHF/female) -Coumadin was 4.4, received 5 mg of IV vitamin K, Coumadin to be restarted by pharmacy.  Hypertension. -Hold Lasix and lisinopril for now -Continue beta  blocker  Leukocytosis. WBCs 19.2. Chart review indicates value 7.0 yesterday. Likely secondary to UTI.  Diabetes. Serum glucose 296 on admission. She's not eaten or taken her insulin today. -Hemoglobin A1c is 7.3. -Use sliding scale insulin for optimal control -Increase Lantus insulin.  Obesity. BMI 29.6 -Nutritional consult  Bilateral ankle fx. Stable. Secondary to fall in July. Conservative management -PT when ready    Medical Consultants:    Orthopedics   Discharge Exam:   Vitals:   12/04/15 1957 12/05/15 0752  BP: 111/64 (!) 157/57  Pulse: (!) 117 71  Resp: 16 16  Temp: 97.5 F (36.4 C) 97.7 F (36.5 C)   Vitals:   12/04/15 1055 12/04/15 1243 12/04/15 1957 12/05/15 0752  BP: (!) 161/62 (!) 154/62 111/64 (!) 157/57  Pulse: 75 73 (!) 117 71  Resp:  16 16 16   Temp:  98.1 F (36.7 C) 97.5 F (36.4 C) 97.7 F (36.5 C)  TempSrc:  Oral Oral Oral  SpO2:  95% 98% 98%  Weight:    94 kg (207 lb 4.8 oz)  Height:        General exam: Appears calm and comfortable.  Respiratory system: Clear to auscultation. Respiratory effort normal. Cardiovascular system: S1 & S2 heard, RRR. No JVD,  rubs, gallops or clicks. No murmurs. Gastrointestinal system: Abdomen is nondistended, soft and nontender. No organomegaly or masses felt. Normal bowel sounds heard. Extremities: No clubbing,  or cyanosis. No edema. Skin: No rashes, lesions or ulcers. Left hip dressings intact, Psychiatry: Judgement and insight appear normal. Mood & affect appropriate.    The results of significant diagnostics from this hospitalization (including imaging, microbiology, ancillary and laboratory) are listed below for reference.     Procedures and Diagnostic Studies:   Chest Portable 1 View  Result Date: 12/01/2015 CLINICAL DATA:  Known hip fracture. Pre-op image of chest. Pt denies all chest complaints at this time. Hx HTN controlled with medication and multiple heart and lung conditions. EXAM:  PORTABLE CHEST 1 VIEW COMPARISON:  05/20/2015 FINDINGS: Cardiac silhouette is mildly enlarged. No mediastinal or hilar masses. No evidence of adenopathy. Clear lungs.  No pleural effusion or pneumothorax. Bony thorax is demineralized. There is an old healed rib fracture on the left. IMPRESSION: No acute cardiopulmonary disease. Electronically Signed   By: Lajean Manes M.D.   On: 12/01/2015 15:35   Dg Abd Portable 1v  Result Date: 12/02/2015 CLINICAL DATA:  Nausea and vomiting postoperatively. EXAM: PORTABLE ABDOMEN - 1 VIEW COMPARISON:  CT abdomen pelvis 05/20/2015. FINDINGS: Gaseous distension of small bowel and colon. No unexpected radiopaque calculi. IMPRESSION: Bowel-gas pattern suggests a postoperative ileus. Electronically Signed   By: Lorin Picket M.D.   On: 12/02/2015 16:35   Dg C-arm 1-60 Min  Result Date: 12/02/2015 CLINICAL DATA:  Left femoral intramedullary nailing up intratrochanteric fracture EXAM: LEFT FEMUR 2 VIEWS; DG C-ARM 61-120 MIN COMPARISON:  12/01/2015 FINDINGS: 44.1 seconds of fluoroscopic time utilized. Fine bony detail is limited by the seen on fluoroscopic technique. There is intertrochanteric fracture traversed by an intra medullary nail partially imaged on the AP and frogleg views. The distal aspect of the femoral component is excluded. Near-anatomic alignment is seen. No definite hardware failure. Intertrochanteric fracture line is seen with mild adjacent to the lesser trochanter. IMPRESSION: Intra medullary femoral nailing of left femoral intertrochanteric fracture.  Electronically Signed   By: Ashley Royalty M.D.   On: 12/02/2015 23:47   Dg Hip Unilat With Pelvis 2-3 Views Left  Result Date: 12/01/2015 CLINICAL DATA:  75 year old female with history of trauma from a fall in the bathroom earlier today complaining of left-sided hip pain. EXAM: DG HIP (WITH OR WITHOUT PELVIS) 2-3V LEFT COMPARISON:  No priors. FINDINGS: Minimally displaced intertrochanteric left hip fracture.  Left hip is located. No acute displaced fracture of the bony pelvis. Postoperative changes of gamma nail fixation are noted in the right femoral neck where there is a healed intertrochanteric hip fracture. IMPRESSION: 1. Nondisplaced intertrochanteric hip fracture of the left hip. Electronically Signed   By: Vinnie Langton M.D.   On: 12/01/2015 14:32   Dg Femur Min 2 Views Left  Result Date: 12/02/2015 CLINICAL DATA:  Left femoral intramedullary nailing up intratrochanteric fracture EXAM: LEFT FEMUR 2 VIEWS; DG C-ARM 61-120 MIN COMPARISON:  12/01/2015 FINDINGS: 44.1 seconds of fluoroscopic time utilized. Fine bony detail is limited by the seen on fluoroscopic technique. There is intertrochanteric fracture traversed by an intra medullary nail partially imaged on the AP and frogleg views. The distal aspect of the femoral component is excluded. Near-anatomic alignment is seen. No definite hardware failure. Intertrochanteric fracture line is seen with mild adjacent to the lesser trochanter. IMPRESSION: Intra medullary femoral nailing of left femoral intertrochanteric fracture. Electronically Signed   By: Ashley Royalty M.D.   On: 12/02/2015 23:47     Labs:   Basic Metabolic Panel:  Recent Labs Lab 12/01/15 1343 12/01/15 1712 12/02/15 0328 12/03/15 0453 12/04/15 0516  NA 136  --  132* 135 132*  K 4.2  --  3.4* 3.9 4.2  CL 96*  --  98* 97* 98*  CO2 28  --  24 27 27   GLUCOSE 296*  --  299* 330* 260*  BUN 20  --  19 21* 27*  CREATININE 1.87* 1.72* 1.64* 1.76* 1.79*  CALCIUM 9.0  --  8.0* 8.1* 8.0*   GFR Estimated Creatinine Clearance: 34.2 mL/min (by C-G formula based on SCr of 1.79 mg/dL (H)). Liver Function Tests:  Recent Labs Lab 12/01/15 1343  AST 21  ALT 10*  ALKPHOS 184*  BILITOT 0.5  PROT 7.2  ALBUMIN 3.2*    Recent Labs Lab 12/01/15 1343  LIPASE 15   No results for input(s): AMMONIA in the last 168 hours. Coagulation profile  Recent Labs Lab 12/02/15 0958  12/02/15 1759 12/03/15 0453 12/04/15 0516 12/05/15 0304  INR 4.41* 1.57 1.27 1.41 1.86    CBC:  Recent Labs Lab 11/29/15 1146 12/01/15 1343 12/02/15 0328 12/03/15 0453 12/04/15 0516 12/05/15 0304  WBC 7.0 19.2* 14.2* 16.0* 11.5* 9.1  NEUTROABS 4.2 15.2*  --   --   --   --   HGB 10.1* 10.8* 8.9* 8.3* 7.5* 7.2*  HCT 30.1* 33.6* 28.2* 25.6* 22.9* 22.9*  MCV 90.3 93.3 94.0 93.8 93.9 93.1  PLT 329.0 341 273 232 215 273   Cardiac Enzymes: No results for input(s): CKTOTAL, CKMB, CKMBINDEX, TROPONINI in the last 168 hours. BNP: Invalid input(s): POCBNP CBG:  Recent Labs Lab 12/04/15 0648 12/04/15 1105 12/04/15 1549 12/04/15 2350 12/05/15 0620  GLUCAP 248* 262* 215* 142* 150*   D-Dimer No results for input(s): DDIMER in the last 72 hours. Hgb A1c No results for input(s): HGBA1C in the last 72 hours. Lipid Profile No results for input(s): CHOL, HDL, LDLCALC, TRIG, CHOLHDL, LDLDIRECT in the last 72 hours. Thyroid function  studies No results for input(s): TSH, T4TOTAL, T3FREE, THYROIDAB in the last 72 hours.  Invalid input(s): FREET3 Anemia work up No results for input(s): VITAMINB12, FOLATE, FERRITIN, TIBC, IRON, RETICCTPCT in the last 72 hours. Microbiology Recent Results (from the past 240 hour(s))  Surgical PCR screen     Status: Abnormal   Collection Time: 12/01/15  7:26 PM  Result Value Ref Range Status   MRSA, PCR POSITIVE (A) NEGATIVE Final    Comment: RESULT CALLED TO, READ BACK BY AND VERIFIED WITH:  TO RLIGHT(RN) BY TCLEVELAND 12/01/2015 AT 9:19PM    Staphylococcus aureus POSITIVE (A) NEGATIVE Final    Comment:        The Xpert SA Assay (FDA approved for NASAL specimens in patients over 35 years of age), is one component of a comprehensive surveillance program.  Test performance has been validated by [facility] for patients greater than or equal to 69 year old. It is not intended to diagnose infection nor to guide or monitor treatment. RESULT  CALLED TO, READ BACK BY AND VERIFIED WITH:  TO RLIGHT RESULT CALLED TO, READ BACK BY AND VERIFIED WITH:  TO RLIGHT(RN) BY TCLEVELAND 12/01/2015 AT 9:19PM   Culture, Urine     Status: Abnormal   Collection Time: 12/02/15  5:03 AM  Result Value Ref Range Status   Specimen Description URINE, RANDOM  Final   Special Requests NONE  Final   Culture >=100,000 COLONIES/mL ESCHERICHIA COLI (A)  Final   Report Status 12/04/2015 FINAL  Final   Organism ID, Bacteria ESCHERICHIA COLI (A)  Final      Susceptibility   Escherichia coli - MIC*    AMPICILLIN >=32 RESISTANT Resistant     CEFAZOLIN <=4 SENSITIVE Sensitive     CEFTRIAXONE <=1 SENSITIVE Sensitive     CIPROFLOXACIN <=0.25 SENSITIVE Sensitive     GENTAMICIN <=1 SENSITIVE Sensitive     IMIPENEM <=0.25 SENSITIVE Sensitive     NITROFURANTOIN <=16 SENSITIVE Sensitive     TRIMETH/SULFA >=320 RESISTANT Resistant     AMPICILLIN/SULBACTAM 16 INTERMEDIATE Intermediate     PIP/TAZO <=4 SENSITIVE Sensitive     Extended ESBL NEGATIVE Sensitive     * >=100,000 COLONIES/mL ESCHERICHIA COLI     Discharge Instructions:      Medication List    STOP taking these medications   acetaminophen 325 MG tablet Commonly known as:  TYLENOL     TAKE these medications   atorvastatin 80 MG tablet Commonly known as:  LIPITOR Take 80 mg by mouth daily.   calcium-vitamin D 500-200 MG-UNIT tablet Commonly known as:  OSCAL WITH D Take 1 tablet by mouth daily.   CENTRUM SILVER ADULT 50+ PO Take 1 tablet by mouth every morning. Reported on 08/07/2015   clopidogrel 75 MG tablet Commonly known as:  PLAVIX Take 1 tablet (75 mg total) by mouth daily.   escitalopram 10 MG tablet Commonly known as:  LEXAPRO TAKE TWO TABLETS BY MOUTH ONCE DAILY   furosemide 40 MG tablet Commonly known as:  LASIX Take 1 tablet (40 mg total) by mouth 2 (two) times daily.   insulin NPH-regular Human (70-30) 100 UNIT/ML injection Commonly known as:  NOVOLIN 70/30 Inject  14-30 Units into the skin See admin instructions. Inject 30 units in the morning. 14 units 2 times daily with lunch and dinnner   lisinopril 5 MG tablet Commonly known as:  PRINIVIL,ZESTRIL Take 1 tablet (5 mg total) by mouth daily.   LORazepam 0.5 MG tablet Commonly known as:  ATIVAN Take 1 tablet (0.5 mg total) by mouth 2 (two) times daily as needed for anxiety.   methocarbamol 500 MG tablet Commonly known as:  ROBAXIN Take 1 tablet (500 mg total) by mouth every 6 (six) hours as needed for muscle spasms.   metoprolol succinate 50 MG 24 hr tablet Commonly known as:  TOPROL-XL Take 25 mg by mouth daily. Take with or immediately following a meal.   oxyCODONE-acetaminophen 5-325 MG tablet Commonly known as:  ROXICET Take 1 tablet by mouth every 6 (six) hours as needed for severe pain.   pantoprazole 40 MG tablet Commonly known as:  PROTONIX Take 1 tablet (40 mg total) by mouth daily.   phenytoin 200 MG ER capsule Commonly known as:  DILANTIN Take 1 capsule (200 mg total) by mouth 2 (two) times daily.   PROCEL Powd Take 2 scoop by mouth 3 (three) times daily. Stop date 10/26/15   promethazine 12.5 MG tablet Commonly known as:  PHENERGAN Take 1 tablet (12.5 mg total) by mouth every 6 (six) hours as needed for nausea or vomiting.   saccharomyces boulardii 250 MG capsule Commonly known as:  FLORASTOR Take 1 capsule (250 mg total) by mouth 2 (two) times daily.   warfarin 2.5 MG tablet Commonly known as:  COUMADIN Take 1 tablet (2.5 mg total) by mouth daily. What changed:  when to take this      Follow-up Information    [name], MD Follow up in 2 week(s).   Specialty:  Orthopedic Surgery Why:  NEED RETURN OFFICE VISIT 2 WEEKS POSTOP Contact information: [place] [address] [phone]            Time coordinating discharge: 20 minutes.  Signed:  RAMA,CHRISTINA  Pager [phone] [facility] 12/05/2015, 11:40  AM

## 2015-12-05 NOTE — Progress Notes (Signed)
Patient ID: [name] [name], female   DOB: Sep 06, 1940, 75 y.o.   MRN: [id number]   Spoke with patient this morning.  She is feeling much better.  abd xray was reviewed.  No ileus.  Stable and ok for transfer to [facility] this morning.

## 2015-12-05 NOTE — NC FL2 (Signed)
[place] LEVEL OF CARE SCREENING TOOL     IDENTIFICATION  Patient Name: [name] [name] Birthdate: 18-Aug-1940 Sex: female Admission Date (Current Location): 12/01/2015  [facility] and [place] Number:  Herbalist and Address:  [facility] [place] [address] [place], [place] [address]      Provider Number: [id number]  Attending Physician Name and Address:  Verlee Monte, MD  Relative Name and Phone Number:       Current Level of Care: SNF Recommended Level of Care: [place] Prior Approval Number:    Date Approved/Denied:   PASRR Number: [id number]  Discharge Plan:      Current Diagnoses: Patient Active Problem List   Diagnosis Date Noted  . Hip fracture ([place]) 12/01/2015  . Anemia 12/01/2015  . Leukocytosis 12/01/2015  . Ankle fracture, left 09/17/2015  . Ankle fracture, bimalleolar, closed   . Bilateral fibular fractures 09/14/2015  . Hypertensive heart disease 05/20/2015  . CAD (coronary artery disease) 05/20/2015  . Ischemic cardiomyopathy 05/20/2015  . Acute systolic CHF (congestive heart failure) ([place]) 05/20/2015  . PAF (paroxysmal atrial fibrillation) ([place]) 05/20/2015  . Anticoagulated on Coumadin 05/20/2015  . Type 2 diabetes mellitus with vascular disease ([place]) 05/17/2015  . NSTEMI (non-ST elevated myocardial infarction) ([place])   . Bacteremia due to Escherichia coli 05/11/2015  . Acute kidney injury superimposed on chronic kidney disease ([place]) 05/11/2015  . Uncontrolled hypertension 05/11/2015  . 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[place]) 51/04/5850  . Atrial fibrillation with RVR ([place]) 05/06/2015  . PCP NOTES >>>>>>>>>>>>>>>> 12/28/2014  . Depression 05/17/2014  . Memory loss 05/17/2014  . Diabetes mellitus-PCP comments 01/23/2014  . A-fib ([place]) 01/23/2014  . Anxiety and depression, PCP notes  11/24/2013  . Severe obesity (BMI >= 40) ([place]) 07/14/2013  . Encounter for therapeutic drug monitoring 06/02/2013  .  Dilantin toxicity 11/21/2012  . History of encephalitis 11/18/2012  . Localization-related (focal) (partial) epilepsy and epileptic syndromes with complex partial seizures, without mention of intractable epilepsy 09/27/2012  . Hyponatremia 07/16/2012  . Intertrochanteric fracture of right hip ([place]) 07/13/2012  . DM (diabetes mellitus) ([place]) 01/19/2012  . GERD (gastroesophageal reflux disease) 10/05/2011  . Shoulder pain 08/27/2011  . Seizure disorder ([place]) 05/24/2011  . UTI (lower urinary tract infection) 05/23/2011  . UTI (urinary tract infection) 05/22/2011  . Annual physical exam 02/10/2011  . Long term (current) use of anticoagulants 09/23/2010  . Atrial fibrillation ([place])   . 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 Hyperlipidemia 03/27/2006  . PERIPHERAL NEUROPATHY 03/27/2006  . 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 Essential hypertension 03/27/2006  . OSTEOPENIA 03/27/2006    Orientation RESPIRATION BLADDER Height & Weight     Self, Situation, Place, Time  Normal Continent Weight: 207 lb 4.8 oz (94 kg) Height:  5\' 9"  (175.3 cm)  BEHAVIORAL SYMPTOMS/MOOD NEUROLOGICAL BOWEL NUTRITION STATUS      Continent    AMBULATORY STATUS COMMUNICATION OF NEEDS Skin   Limited Assist Verbally Normal                       Personal Care Assistance Level of Assistance  Bathing, Dressing Bathing Assistance: Limited assistance   Dressing Assistance: Limited assistance     Functional Limitations Info             SPECIAL CARE FACTORS FREQUENCY                       Contractures  Additional Factors Info                  Current Medications (12/05/2015):  This is the current hospital active medication list Current Facility-Administered Medications  Medication Dose Route Frequency Provider Last Rate Last Dose  . 0.45 % sodium chloride infusion   Intravenous Continuous [name], MD 10 mL/hr at 12/03/15 1825    . acetaminophen (TYLENOL) tablet 650 mg  650 mg Oral  Q6H PRN [name], MD       Or  . acetaminophen (TYLENOL) suppository 650 mg  650 mg Rectal Q6H PRN [name], MD      . cefTRIAXone (ROCEPHIN) 1 g in dextrose 5 % 50 mL IVPB  1 g Intravenous Q24H Verlee Monte, MD   1 g at 12/05/15 1207  . Chlorhexidine Gluconate Cloth 2 % PADS 6 each  6 each Topical Q0600 [name], MD   6 each at 12/05/15 [phone]  . docusate sodium (COLACE) capsule 100 mg  100 mg Oral BID [name], MD   100 mg at 12/05/15 1016  . escitalopram (LEXAPRO) tablet 20 mg  20 mg Oral Daily Coralie Keens, MD   20 mg at 12/05/15 1016  . heparin injection 5,000 Units  5,000 Units Subcutaneous Q8H Verlee Monte, MD   5,000 Units at 12/05/15 7412  . HYDROcodone-acetaminophen (NORCO/VICODIN) 5-325 MG per tablet 1-2 tablet  1-2 tablet Oral Q6H PRN [name], MD   1 tablet at 12/04/15 1624  . insulin aspart (novoLOG) injection 0-15 Units  0-15 Units Subcutaneous TID WC Radene Gunning, NP   5 Units at 12/05/15 1207  . insulin glargine (LANTUS) injection 20 Units  20 Units Subcutaneous Daily Verlee Monte, MD   20 Units at 12/05/15 1016  . LORazepam (ATIVAN) tablet 0.5 mg  0.5 mg Oral BID PRN Lezlie Octave Black, NP      . menthol-cetylpyridinium (CEPACOL) lozenge 3 mg  1 lozenge Oral PRN [name], MD       Or  . phenol (CHLORASEPTIC) mouth spray 1 spray  1 spray Mouth/Throat PRN [name], MD      . methocarbamol (ROBAXIN) tablet 500 mg  500 mg Oral Q6H PRN Radene Gunning, NP       Or  . methocarbamol (ROBAXIN) 500 mg in dextrose 5 % 50 mL IVPB  500 mg Intravenous Q6H PRN Radene Gunning, NP      . metoCLOPramide (REGLAN) tablet 5-10 mg  5-10 mg Oral Q8H PRN [name], MD       Or  . metoCLOPramide (REGLAN) injection 5-10 mg  5-10 mg Intravenous Q8H PRN [name], MD      . metoprolol succinate (TOPROL-XL) 24 hr tablet 25 mg  25 mg Oral Daily Mcarthur Rossetti, MD   25 mg at 12/05/15 1016  . mupirocin ointment (BACTROBAN) 2 % 1 application  1 application Nasal BID [name], MD   1 application at 87/86/76 1016  . ondansetron (ZOFRAN) tablet 4 mg  4 mg Oral Q6H PRN [name], MD       Or  . ondansetron [facility]) injection 4 mg  4 mg Intravenous Q6H PRN [name], MD   4 mg at 12/04/15 1424  . phenytoin (DILANTIN) ER capsule 200 mg  200 mg Oral BID Elwin Mocha, MD   200 mg at 12/05/15 1016  . warfarin (COUMADIN) tablet 2.5  mg  2.5 mg Oral ONCE-1800 Verlee Monte, MD      . Warfarin - Pharmacist Dosing Inpatient   Does not apply [id number] Verlee Monte, MD      . zolpidem (AMBIEN) tablet 5 mg  5 mg Oral QHS PRN Radene Gunning, NP         Discharge Medications: Please see discharge summary for a list of discharge medications.  Relevant Imaging Results:  Relevant Lab Results:   Additional Information    [employer], LCSW

## 2015-12-05 NOTE — Progress Notes (Addendum)
Inpatient Diabetes Program Recommendations  AACE/ADA: New Consensus Statement on Inpatient Glycemic Control (2015)  Target Ranges:  Prepandial:   less than 140 mg/dL      Peak postprandial:   less than 180 mg/dL (1-2 hours)      Critically ill patients:  140 - 180 mg/dL   Lab Results  Component Value Date   GLUCAP 150 (H) 12/05/2015   HGBA1C 7.3 (H) 12/02/2015    Review of Glycemic Control  Results for [name] [name], [name] [name] (MRN [id number]) as of 12/05/2015 10:04  Ref. Range 12/04/2015 06:48 12/04/2015 11:05 12/04/2015 15:49 12/04/2015 23:50 12/05/2015 06:20  Glucose-Capillary Latest Ref Range: 65 - 99 mg/dL 248 (H) 262 (H) 215 (H) 142 (H) 150 (H)    Diabetes history:Type 2 diabetes  Outpatient Diabetes medications: Novolin 70/30 30 units in the AM, 14 units with lunch, and 14 units with supper  Current orders for Inpatient glycemic control: Novolog moderate tid with meals, Lantus 20 units daily  Inpatient Diabetes Program Recommendations:  Please consider adding Novolog meal coverage 5 units tid with meals (post prandial blood sugars remain elevated).     Gentry Fitz, RN, BA, MHA, CDE Diabetes Coordinator Inpatient Diabetes Program  [phone] (Team Pager) [phone] ([place]) 12/05/2015 10:07 AM

## 2015-12-05 NOTE — Plan of Care (Signed)
Problem: Physical Regulation: Goal: Will remain free from infection Outcome: Progressing No S/S of infection noted Goal: Postoperative complications will be avoided or minimized Outcome: Progressing No complications noted  Problem: Self-Concept: Goal: Verbalizations of decreased anxiety will increase Outcome: Progressing    No signs of anxiety noted,  Problem: Pain Management: Goal: Pain level will decrease Outcome: Progressing Denies pain this evening

## 2015-12-05 NOTE — Progress Notes (Signed)
Called attending TRH8 to clarify discharge summary, TRH8 stated this patient is not on his list,ortho paged but stated they are not responsible. SW made aware and will  Follow up who is the attensding on this patient.

## 2015-12-05 NOTE — Progress Notes (Signed)
Subjective: 3 Days Post-Op Procedure(s) (LRB): INTRAMEDULLARY (IM) NAIL FEMORAL (Left) Patient reports pain as mild.    Objective: Vital signs in last 24 hours: Temp:  [97.5 F (36.4 C)-98.1 F (36.7 C)] 97.7 F (36.5 C) (10/05 0752) Pulse Rate:  [71-117] 71 (10/05 0752) Resp:  [16] 16 (10/05 0752) BP: (111-161)/(57-64) 157/57 (10/05 0752) SpO2:  [95 %-98 %] 98 % (10/05 0752) Weight:  [94 kg (207 lb 4.8 oz)] 94 kg (207 lb 4.8 oz) (10/05 0752)  Intake/Output from previous day: 10/04 0701 - 10/05 0700 In: 450 [P.O.:240; I.V.:110; IV Piggyback:100] Out: -  Intake/Output this shift: No intake/output data recorded.   Recent Labs  12/03/15 0453 12/04/15 0516 12/05/15 0304  HGB 8.3* 7.5* 7.2*    Recent Labs  12/04/15 0516 12/05/15 0304  WBC 11.5* 9.1  RBC 2.44* 2.46*  HCT 22.9* 22.9*  PLT 215 273    Recent Labs  12/03/15 0453 12/04/15 0516  NA 135 132*  K 3.9 4.2  CL 97* 98*  CO2 27 27  BUN 21* 27*  CREATININE 1.76* 1.79*  GLUCOSE 330* 260*  CALCIUM 8.1* 8.0*    Recent Labs  12/04/15 0516 12/05/15 0304  INR 1.41 1.86    Neurologically intact  Assessment/Plan: 3 Days Post-Op Procedure(s) (LRB): INTRAMEDULLARY (IM) NAIL FEMORAL (Left) Up with therapy  , SNF.   [name] [name] 12/05/2015, 8:01 AM

## 2015-12-05 NOTE — Clinical Social Work Placement (Signed)
   CLINICAL SOCIAL WORK PLACEMENT  NOTE  Date:  12/05/2015  Patient Details  Name: [name] [name] MRN: [id number] Date of Birth: [birth year]  Clinical Social Work is seeking post-discharge placement for this patient at the [place] level of care (*CSW will initial, date and re-position this form in  chart as items are completed):  Yes   Patient/family provided with [place] Work Department's list of facilities offering this level of care within the geographic area requested by the patient (or if unable, by the patient's family).  Yes   Patient/family informed of their freedom to choose among providers that offer the needed level of care, that participate in Medicare, Medicaid or managed care program needed by the patient, have an available bed and are willing to accept the patient.  Yes   Patient/family informed of [facility]'s ownership interest in [facility] and [facility], as well as of the fact that they are under no obligation to receive care at these facilities.  PASRR submitted to EDS on       PASRR number received on       Existing PASRR number confirmed on 12/05/15     FL2 transmitted to all facilities in geographic area requested by pt/family on 12/05/15     FL2 transmitted to all facilities within larger geographic area on       Patient informed that his/her managed care company has contracts with or will negotiate with certain facilities, including the following:        Yes   Patient/family informed of bed offers received.  Patient chooses bed at  [facility])     Physician recommends and patient chooses bed at      Patient to be transferred to  [facility]) on 12/05/15.  Patient to be transferred to facility by [facility]     Patient family notified on 12/05/15 of transfer.  Name of family member notified:        PHYSICIAN       Additional Comment:  Clinical Social Worker facilitated patient discharge  including contacting patient family and facility to confirm patient discharge plans.  Clinical information faxed to facility and family agreeable with plan.  CSW arranged ambulance transport via [facility] to [facility] .  RN to call report prior to discharge.  Clinical Social Worker will sign off for now as social work intervention is no longer needed. Please consult [place] again if new need arises.    _______________________________________________ Sela Hilding, [place]

## 2015-12-05 NOTE — Progress Notes (Signed)
Discharge to [facility] transported by [facility], patient was alert and oriented, not in any distress, painmed given prior to discharge. Report given to [place], Therapist, sports.

## 2015-12-05 NOTE — Progress Notes (Signed)
[facility] for Warfarin Indication: VTE prophylaxis  Allergies  Allergen Reactions  . Procaine Hcl Anaphylaxis    Patient Measurements: Height: 5\' 9"  (175.3 cm) Weight: 207 lb 4.8 oz (94 kg) IBW/kg (Calculated) : 66.2  Vital Signs: Temp: 97.7 F (36.5 C) (10/05 0752) Temp Source: Oral (10/05 0752) BP: 157/57 (10/05 0752) Pulse Rate: 71 (10/05 0752)  Labs:  Recent Labs  12/03/15 0453 12/04/15 0516 12/05/15 0304  HGB 8.3* 7.5* 7.2*  HCT 25.6* 22.9* 22.9*  PLT 232 215 273  LABPROT 16.0* 17.4* 21.7*  INR 1.27 1.41 1.86  CREATININE 1.76* 1.79*  --     Estimated Creatinine Clearance: 34.2 mL/min (by C-G formula based on SCr of 1.79 mg/dL (H)).   Medical History: Past Medical History:  Diagnosis Date  . Atrial fibrillation ([place])    SVT dx 2007, cath 2007 mild CAD, then had an cardioversion, ablation; still on coumadin , has occ palpitation, EKG 03-2010 NSR  . Blindness of left eye   . Common migraine    ?hx of  . DIABETES MELLITUS, TYPE II 03/27/2006   dr [name]  . Eye muscle weakness    Right eye weakness after cataract surgery  . GERD (gastroesophageal reflux disease) 10/05/2011  . Headache(784.0) 05/07/2009  . 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 HYPERLIPIDEMIA 03/27/2006  . HYPERTENSION 03/27/2006  . Intertrochanteric fracture of right hip ([place]) 07/13/2012  . 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 Osteopenia 2004   Dexa 2004 showed Osteopenia, DEXA 03/2007 normal  . Osteopenia   . Other chronic cystitis with hematuria   . Recurrent urinary tract infection    Seeing Urology  . 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 Seizures ([place])   . 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[place]) 1/70/0174    Medications: see medical record  Assessment: 75 y.o. female s/p L hip fracture/repair for VTE prophylaxis.  Pt with h/o Afib on warfarin 5mg  daily except 7.5mg  every Tue and Friday PTA (corrected from previous report).    Today's INR increased to 1.86,  increasing toward goal 2-3. I spoke with patient today and clarified her PTA coumadin dose to be  coumadin 5mg  daily except 7.5mg  qTue and Fri.  She has 2.5mg  tablets at home and confirms that she takes 2 tabs daily except 3 tabs qTue & Fri. This matches the [facility] clinic visit note on 11/25/15.  Previous med rec had*Pt reported dose = 2.5 mg daily- pt says that was the "old,old dose" -on admit 10/2 INR 4.41;  Vitamin K 5mg  IV 10/2. Effects of vitamin K likely to hang around a few days.   Warfarin resumed 10/3 (got total of 7 mg?); -not eating as much as at home d/t doesn't like taste of the food here. Her husband is a good cook. - continues on SQ heparin for VTE prophylaxis until INR therapeutic.   Goal of Therapy:  INR 2-3 Monitor platelets by anticoagulation protocol: Yes   Plan:  Warfarin 2.5 mg x 1 Monitor daily INR.  Nicole Cella, RPh Clinical Pharmacist Pager: [phone] 12/05/2015 1:23 PM

## 2015-12-06 LAB — PHENYTOIN LEVEL, FREE AND TOTAL
Phenytoin, Free: NOT DETECTED ug/mL (ref 1.0–2.0)
Phenytoin, Total: 3.2 ug/mL — ABNORMAL LOW (ref 10.0–20.0)

## 2015-12-12 ENCOUNTER — Telehealth: Payer: Self-pay

## 2015-12-12 NOTE — Telephone Encounter (Signed)
Called spoke with pt's husband, pt is overdue for Coumadin follow-up, pt has been in hospital fell broke hip, had surgery 12/02/15, discharged to [facility] rehab in [place] 12/05/15.  Pt's husband states they are monitoring and managing pt's Coumadin at present.  He will call to schedule f/u appt once pt discharged from rehab.

## 2015-12-14 ENCOUNTER — Emergency Department (HOSPITAL_COMMUNITY): Payer: Medicare Other

## 2015-12-14 ENCOUNTER — Emergency Department (HOSPITAL_COMMUNITY)
Admission: EM | Admit: 2015-12-14 | Discharge: 2015-12-14 | Disposition: A | Discharge status: Home / Self Care | Payer: Medicare Other | Attending: Emergency Medicine | Admitting: Emergency Medicine

## 2015-12-14 ENCOUNTER — Emergency Department (HOSPITAL_BASED_OUTPATIENT_CLINIC_OR_DEPARTMENT_OTHER)
Admit: 2015-12-14 | Discharge: 2015-12-14 | Disposition: A | Discharge status: Home / Self Care | Payer: Medicare Other | Attending: Emergency Medicine | Admitting: Emergency Medicine

## 2015-12-14 DIAGNOSIS — M7989 Other specified soft tissue disorders: Secondary | ICD-10-CM | POA: Diagnosis not present

## 2015-12-14 DIAGNOSIS — Z79899 Other long term (current) drug therapy: Secondary | ICD-10-CM | POA: Insufficient documentation

## 2015-12-14 DIAGNOSIS — I252 Old myocardial infarction: Secondary | ICD-10-CM | POA: Insufficient documentation

## 2015-12-14 DIAGNOSIS — Z7901 Long term (current) use of anticoagulants: Secondary | ICD-10-CM | POA: Diagnosis not present

## 2015-12-14 DIAGNOSIS — M25552 Pain in left hip: Secondary | ICD-10-CM | POA: Diagnosis present

## 2015-12-14 DIAGNOSIS — I5021 Acute systolic (congestive) heart failure: Secondary | ICD-10-CM | POA: Diagnosis not present

## 2015-12-14 DIAGNOSIS — M79609 Pain in unspecified limb: Secondary | ICD-10-CM

## 2015-12-14 DIAGNOSIS — E1142 Type 2 diabetes mellitus with diabetic polyneuropathy: Secondary | ICD-10-CM | POA: Insufficient documentation

## 2015-12-14 DIAGNOSIS — I110 Hypertensive heart disease with heart failure: Secondary | ICD-10-CM | POA: Insufficient documentation

## 2015-12-14 DIAGNOSIS — Z794 Long term (current) use of insulin: Secondary | ICD-10-CM | POA: Diagnosis not present

## 2015-12-14 DIAGNOSIS — I2510 Atherosclerotic heart disease of native coronary artery without angina pectoris: Secondary | ICD-10-CM | POA: Insufficient documentation

## 2015-12-14 DIAGNOSIS — Z87891 Personal history of nicotine dependence: Secondary | ICD-10-CM | POA: Insufficient documentation

## 2015-12-14 LAB — BASIC METABOLIC PANEL
Anion gap: 13 (ref 5–15)
BUN: 30 mg/dL — ABNORMAL HIGH (ref 6–20)
CO2: 26 mmol/L (ref 22–32)
Calcium: 9.1 mg/dL (ref 8.9–10.3)
Chloride: 96 mmol/L — ABNORMAL LOW (ref 101–111)
Creatinine, Ser: 1.41 mg/dL — ABNORMAL HIGH (ref 0.44–1.00)
GFR calc Af Amer: 41 mL/min — ABNORMAL LOW (ref >60)
GFR, EST NON AFRICAN AMERICAN: 36 mL/min — AB (ref >60)
GLUCOSE: 72 mg/dL (ref 65–99)
Potassium: 4 mmol/L (ref 3.5–5.1)
Sodium: 135 mmol/L (ref 135–145)

## 2015-12-14 LAB — CBC
HEMATOCRIT: 30.1 % — AB (ref 36.0–46.0)
HEMOGLOBIN: 9.6 g/dL — AB (ref 12.0–15.0)
MCH: 29.4 pg (ref 26.0–34.0)
MCHC: 31.9 g/dL (ref 30.0–36.0)
MCV: 92.3 fL (ref 78.0–100.0)
Platelets: 661 10*3/uL — ABNORMAL HIGH (ref 150–400)
RBC: 3.26 MIL/uL — AB (ref 3.87–5.11)
RDW: 14.3 % (ref 11.5–15.5)
WBC: 9.4 10*3/uL (ref 4.0–10.5)

## 2015-12-14 LAB — PROTIME-INR
INR: 1.55
Prothrombin Time: 18.7 seconds — ABNORMAL HIGH (ref 11.4–15.2)

## 2015-12-14 MED ORDER — ENOXAPARIN SODIUM 100 MG/ML ~~LOC~~ SOLN
1.0000 mg/kg | Freq: Once | SUBCUTANEOUS | Status: AC
  Administered 2015-12-14: 95 mg via SUBCUTANEOUS
  Filled 2015-12-14: qty 1

## 2015-12-14 MED ORDER — ENOXAPARIN SODIUM 30 MG/0.3ML ~~LOC~~ SOLN: 1.0000 mg/kg | Freq: Two times a day (BID) | SUBCUTANEOUS | 0 refills | Status: DC

## 2015-12-14 MED ORDER — WARFARIN SODIUM 5 MG PO TABS: 5.0000 mg | ORAL_TABLET | Freq: Every day | ORAL | 0 refills | Status: DC

## 2015-12-14 NOTE — ED Notes (Signed)
Pt transported to xray 

## 2015-12-14 NOTE — ED Provider Notes (Signed)
Please see previous physicians note regarding patient's presenting history and physical, initial ED course, and associated medical decision making.  Recent operative management of left intertrochanteric hip fractured, discharged to nursing facility. Presents with increased left hip pain today w/o new trauma. Images reviewed by orthopedic surgery per previous provider, recommending NWB and follow-up in clinic as outpatient in one week as previously scheduled. Pending [place] LE for DVT rule out. Is on coumadin for atrial fibrillation.  Sit therapeutic INR with nonocclusive clot on ultrasound in the left lower extremity. Does have a GFR of 32. Discussed with pharmacist who recommended Lovenox bridging. On chart review with pharmacy she used to be on 5 mg and 7.5 mg of Coumadin every other day. She is currently on 3 mg of Coumadin. Pharmacy recommended titrating Coumadin to 5 mg daily and have INR rechecked in 3 days.   plan of care is discussed with the patient and written down for facility. The patient appears reasonably screened and/or stabilized for discharge and I doubt any other medical condition or other [facility] requiring further screening, evaluation, or treatment in the ED at this time prior to discharge. Strict return and follow-up instructions reviewed. She expressed understanding of all discharge instructions and felt comfortable with the plan of care.     Forde Dandy, MD 12/14/15 [phone]

## 2015-12-14 NOTE — Progress Notes (Signed)
VASCULAR LAB PRELIMINARY  PRELIMINARY  PRELIMINARY  PRELIMINARY  Bilateral lower extremity venous duplex completed.    Preliminary report:  There is acute, non occlusive DVT noted in the left proximal femoral vein.  All other veins appear thrombus free, however, the flow in the common femoral vein appears continuous, bilaterally, possibly suggestive of compression or thrombus of veins higher than I am able to visualize.   Called report to Dr. Jerrell Mylar, [facility], RVT 12/14/2015, 4:26 PM

## 2015-12-14 NOTE — ED Provider Notes (Signed)
[facility] DEPT Provider Note   CSN: [id number] Arrival date & time: 12/14/15  1242     History   Chief Complaint Chief Complaint  Patient presents with  . Hip Pain    HPI [name] [name] is a 75 year old woman with history of atrial fibrillation on warfarin, CAD, systolic CHF, HTN, DM2, seizures.  HPI  Recently admitted 10/1 for left hip fracture after fall and undrwent left trochanteric nail on 10/2 by Dr. Lorin Mercy. She was discharged to SNF on 10/5. She has been having increase in her hip pain. She has not been able to stand or walk on her leg. She has been undergoing physical therapy at her SNF. She takes oxycodone for pain which does not help. No pain at rest. Pain is in her left lateral hip and radiates down to her knee. 3/10.  Past Medical History:  Diagnosis Date  . Atrial fibrillation ([place])    SVT dx 2007, cath 2007 mild CAD, then had an cardioversion, ablation; still on coumadin , has occ palpitation, EKG 03-2010 NSR  . Blindness of left eye   . Common migraine    ?hx of  . DIABETES MELLITUS, TYPE II 03/27/2006   dr [name]  . Eye muscle weakness    Right eye weakness after cataract surgery  . GERD (gastroesophageal reflux disease) 10/05/2011  . Headache(784.0) 05/07/2009  . 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 HYPERLIPIDEMIA 03/27/2006  . HYPERTENSION 03/27/2006  . Intertrochanteric fracture of right hip ([place]) 07/13/2012  . 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 Osteopenia 2004   Dexa 2004 showed Osteopenia, DEXA 03/2007 normal  . Osteopenia   . Other chronic cystitis with hematuria   . Recurrent urinary tract infection    Seeing Urology  . 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 Seizures ([place])   . 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[place]) 08/19/5091    Patient Active Problem List   Diagnosis Date Noted  . Hip fracture ([place]) 12/01/2015  . Anemia 12/01/2015  . Leukocytosis 12/01/2015  . Ankle fracture, left 09/17/2015  . Ankle fracture, bimalleolar, closed   . Bilateral  fibular fractures 09/14/2015  . Hypertensive heart disease 05/20/2015  . CAD (coronary artery disease) 05/20/2015  . Ischemic cardiomyopathy 05/20/2015  . Acute systolic CHF (congestive heart failure) ([place]) 05/20/2015  . PAF (paroxysmal atrial fibrillation) ([place]) 05/20/2015  . Anticoagulated on Coumadin 05/20/2015  . Type 2 diabetes mellitus with vascular disease ([place]) 05/17/2015  . NSTEMI (non-ST elevated myocardial infarction) ([place])   . Bacteremia due to Escherichia coli 05/11/2015  . Acute kidney injury superimposed on chronic kidney disease ([place]) 05/11/2015  . Uncontrolled hypertension 05/11/2015  . 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[place]) 26/71/2458  . Atrial fibrillation with RVR ([place]) 05/06/2015  . PCP NOTES >>>>>>>>>>>>>>>> 12/28/2014  . Depression 05/17/2014  . Memory loss 05/17/2014  . Diabetes mellitus-PCP comments 01/23/2014  . A-fib ([place]) 01/23/2014  . Anxiety and depression, PCP notes  11/24/2013  . Severe obesity (BMI >= 40) ([place]) 07/14/2013  . Encounter for therapeutic drug monitoring 06/02/2013  . Dilantin toxicity 11/21/2012  . History of encephalitis 11/18/2012  . Localization-related (focal) (partial) epilepsy and epileptic syndromes with complex partial seizures, without mention of intractable epilepsy 09/27/2012  . Hyponatremia 07/16/2012  . Intertrochanteric fracture of right hip ([place]) 07/13/2012  . DM (diabetes mellitus) ([place]) 01/19/2012  . GERD (gastroesophageal reflux disease) 10/05/2011  . Shoulder pain 08/27/2011  . Seizure disorder ([place]) 05/24/2011  . UTI (lower urinary tract infection) 05/23/2011  . UTI (urinary tract infection) 05/22/2011  .  Annual physical exam 02/10/2011  . Long term (current) use of anticoagulants 09/23/2010  . Atrial fibrillation ([place])   . 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 Hyperlipidemia 03/27/2006  . PERIPHERAL NEUROPATHY 03/27/2006  . 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 Essential hypertension 03/27/2006  . OSTEOPENIA 03/27/2006    Past  Surgical History:  Procedure Laterality Date  . CARDIAC CATHETERIZATION N/A 05/17/2015   Procedure: Left Heart Cath and Coronary Angiography;  Surgeon: Jettie Booze, MD;  Location: [facility] CV LAB;  Service: Cardiovascular;  Laterality: N/A;  . CARDIAC CATHETERIZATION N/A 05/17/2015   Procedure: Coronary Balloon Angioplasty;  Surgeon: Jettie Booze, MD;  Location: [facility] CV LAB;  Service: Cardiovascular;  Laterality: N/A;  . FEMUR IM NAIL Right 07/15/2012   Procedure: INTRAMEDULLARY (IM) NAIL HIP;  Surgeon: Johnny Bridge, MD;  Location: [facility];  Service: Orthopedics;  Laterality: Right;  . FEMUR IM NAIL Left 12/02/2015   Procedure: INTRAMEDULLARY (IM) NAIL FEMORAL;  Surgeon: [name], MD;  Location: [facility];  Service: Orthopedics;  Laterality: Left;  . SHOULDER SURGERY      OB History    No data available       Home Medications    Prior to Admission medications   Medication Sig Start Date End Date Taking? Authorizing Provider  atorvastatin (LIPITOR) 80 MG tablet Take 80 mg by mouth daily.    Historical Provider, MD  calcium-vitamin D (OSCAL WITH D) 500-200 MG-UNIT per tablet Take 1 tablet by mouth daily.    Historical Provider, MD  clopidogrel (PLAVIX) 75 MG tablet Take 1 tablet (75 mg total) by mouth daily. 05/21/15   Ripudeep Krystal Eaton, MD  escitalopram (LEXAPRO) 10 MG tablet TAKE TWO TABLETS BY MOUTH ONCE DAILY 07/09/15   Garvin Fila, MD  furosemide (LASIX) 40 MG tablet Take 1 tablet (40 mg total) by mouth 2 (two) times daily. 05/21/15   Ripudeep Krystal Eaton, MD  insulin NPH-regular Human (NOVOLIN 70/30) (70-30) 100 UNIT/ML injection Inject 14-30 Units into the skin See admin instructions. Inject 30 units in the morning. 14 units 2 times daily with lunch and dinnner     Historical Provider, MD  lisinopril (PRINIVIL,ZESTRIL) 5 MG tablet Take 1 tablet (5 mg total) by mouth daily. 06/07/15   Skeet Latch, MD  LORazepam (ATIVAN) 0.5 MG tablet Take 1 tablet (0.5 mg total) by  mouth 2 (two) times daily as needed for anxiety. 10/09/15   Colon Branch, MD  methocarbamol (ROBAXIN) 500 MG tablet Take 1 tablet (500 mg total) by mouth every 6 (six) hours as needed for muscle spasms. 12/04/15   Lanae Crumbly, PA-C  metoprolol succinate (TOPROL-XL) 50 MG 24 hr tablet Take 25 mg by mouth daily. Take with or immediately following a meal.    Historical Provider, MD  Multiple Vitamins-Minerals (CENTRUM SILVER ADULT 50+ PO) Take 1 tablet by mouth every morning. Reported on 08/07/2015    Historical Provider, MD  oxyCODONE-acetaminophen (ROXICET) 5-325 MG tablet Take 1 tablet by mouth every 6 (six) hours as needed for severe pain. 12/04/15   Lanae Crumbly, PA-C  pantoprazole (PROTONIX) 40 MG tablet Take 1 tablet (40 mg total) by mouth daily. 08/07/15   Colon Branch, MD  phenytoin (DILANTIN) 200 MG ER capsule Take 1 capsule (200 mg total) by mouth 2 (two) times daily. 09/18/15   Orson Eva, MD  promethazine (PHENERGAN) 12.5 MG tablet Take 1 tablet (12.5 mg total) by mouth every 6 (six) hours as needed  for nausea or vomiting. 11/13/15   Colon Branch, MD  Protein [facility]) POWD Take 2 scoop by mouth 3 (three) times daily. Stop date 10/26/15    Historical Provider, MD  saccharomyces boulardii (FLORASTOR) 250 MG capsule Take 1 capsule (250 mg total) by mouth 2 (two) times daily. 05/11/15   Nishant Dhungel, MD  warfarin (COUMADIN) 2.5 MG tablet Take 1 tablet (2.5 mg total) by mouth daily. Patient taking differently: Take 2.5 mg by mouth daily. Takes 5 mg ( 2 tablets) daily except 7.5 mg (3 tablets) every Tuesday and Friday. 10/10/15   Evans Lance, MD    Family History Family History  Problem Relation Age of Onset  . Diabetes Father   . Heart attack Father 14  . Diabetes Sister   . Cancer Neg Hx     no hx of colon or breast cancer    Social History Social History  Substance Use Topics  . Smoking status: Former Smoker    Types: Cigarettes    Quit date: 03/02/1978  . Smokeless tobacco: Never Used  .  Alcohol use No     Allergies   Procaine hcl   Review of Systems Review of Systems  Constitutional: no fevers/chills Eyes: no vision changes Ears, nose, mouth, throat, and face: no cough Respiratory: no shortness of breath Cardiovascular: no chest pain Gastrointestinal: no nausea/vomiting, no abdominal pain, no constipation, no diarrhea Genitourinary: no dysuria, no hematuria Integument: no rash Hematologic/lymphatic: no bleeding/bruising, no edema Musculoskeletal: no myalgias Neurological: no paresthesias, no weakness  Physical Exam Updated Vital Signs BP 126/65   Pulse 65   Temp 98.5 F (36.9 C)   Resp 16   Ht 5\' 9"  (1.753 m)   Wt 93.9 kg   SpO2 100%   BMI 30.57 kg/m   Physical Exam  General Apperance: NAD Head: Normocephalic, atraumatic Eyes: PERRL, EOMI, anicteric sclera Ears: Normal external ear canal Nose: Nares normal, septum midline, mucosa normal Throat: Lips, mucosa and tongue normal  Neck: Supple, trachea midline Back: No tenderness or bony abnormality  Lungs: Clear to auscultation bilaterally. No wheezes, rhonchi or rales. Breathing comfortably Chest Wall: Nontender, no deformity Heart: Regular rate and rhythm, no murmur/rub/gallop Abdomen: Soft, nontender, nondistended, no rebound/guarding Extremities: Warm and well perfused, no edema Pulses: 2+ throughout Skin: No rashes or lesions. Left lateral hip incision clean, dry and intact with staples in place. Neurologic: Alert and oriented x 3. CNII-XII intact. Normal strength and sensation      ED Treatments / Results  Labs (all labs ordered are listed, but only abnormal results are displayed) Labs Reviewed  CBC - Abnormal; Notable for the following:       Result Value   RBC 3.26 (*)    Hemoglobin 9.6 (*)    HCT 30.1 (*)    Platelets 661 (*)    All other components within normal limits  BASIC METABOLIC PANEL  PROTIME-INR   Radiology Dg Ankle Complete Left  Result Date:  12/14/2015 CLINICAL DATA:  Pt states that she fell in her bathroom x 2 weeks ago trying to get to the toilet. Pt states that when she fell she landed on her left side but doesn't remember much else. C/o pain in left hip and left ankle. No other complaints. EXAM: LEFT ANKLE COMPLETE - 3+ VIEW COMPARISON:  Plain film of the left ankle dated 09/16/2015. FINDINGS: Osseous structures are diffusely osteopenic which limits characterization of osseous detail. There is no acute-appearing fracture identified. There is an old  healed fracture of the distal left fibula. Ankle mortise is symmetric. Visualized portions of the hindfoot and midfoot appear intact and normally aligned. IMPRESSION: No acute findings.  Old healed fracture of the distal left fibula. Electronically Signed   By: Franki Cabot M.D.   On: 12/14/2015 15:33   Dg Knee Complete 4 Views Left  Result Date: 12/14/2015 CLINICAL DATA:  Pain after fall 2 weeks ago. EXAM: LEFT KNEE - COMPLETE 4+ VIEW COMPARISON:  None. FINDINGS: An intra medullary rod is seen in the distal femur. No fracture, dislocation, or joint effusion. IMPRESSION: No acute abnormality. Electronically Signed   By: Dorise Bullion III M.D   On: 12/14/2015 14:18   Dg Hip Unilat W Or Wo Pelvis 2-3 Views Left  Result Date: 12/14/2015 CLINICAL DATA:  Intertrochanteric fracture December 01, 2015.  Pain. EXAM: DG HIP (WITH OR WITHOUT PELVIS) 2-3V LEFT COMPARISON:  December 01, 2015 FINDINGS: A gamma nail and rod project through the proximal right femur. The pelvic bones are intact. A gamma nail and rod project across the recent left intertrochanteric fracture. No interval fracture or evidence of hardware failure identified. No dislocation. IMPRESSION: No acute abnormality. Lateral skin staples. New gamma nail and intertrochanteric rod across the left intertrochanteric fracture. Electronically Signed   By: Dorise Bullion III M.D   On: 12/14/2015 14:20   Dg Femur Min 2 Views Left  Result Date:  12/14/2015 CLINICAL DATA:  Pt states that she fell in her bathroom x 2 weeks ago trying to get to the toilet. Pt states that when she fell she landed on her left side but doesn't remember much else. C/o pain in left hip and left ankle. EXAM: LEFT FEMUR 2 VIEWS COMPARISON:  Plain film of the left hip dated 12/01/2015 and intraoperative fluoroscopic images dated 12/02/2015. FINDINGS: Patient is status post recent internal fixation of a previous left femoral neck fracture. The fixation hardware appears intact an appropriately aligned. Surgical staples are still present within the soft tissues lateral to the left hip. No new osseous abnormality. Left femoral head is well positioned relative to the acetabulum. IMPRESSION: Status post recent internal fixation of a left femoral neck fracture. No new osseous abnormality appreciated. Hardware appears intact and appropriately positioned. Electronically Signed   By: Franki Cabot M.D.   On: 12/14/2015 15:31    Procedures   Medications Ordered in ED Medications - No data to display   Initial Impression / Assessment and Plan / ED Course  I have reviewed the triage vital signs and the nursing notes.  Pertinent labs & imaging results that were available during my care of the patient were reviewed by me and considered in my medical decision making (see chart for details).  Clinical Course   2:00pm evaluated pt 3:00pm discussed with Dr. Ninfa Linden, recs below.  Final Clinical Impressions(s) / ED Diagnoses   Final diagnoses:  Left hip pain  Left hip pain since left hip fracture s/p left trochanteric nail on 10/2 by Dr. Lorin Mercy here from SNF. XR left knee and hip without acute abnormality. No dislocations. No new abnormality noted on XR femur. XR ankle with no acute abnormalities. Hgb increased from her discharge 7.2 to 9.6. Plt 661 but likely reactive.   Discussed with Dr. Ninfa Linden, covering for Dr. Lorin Mercy. He recommends that she remain nonweightbearing until  outpatient follow up with Dr. Lorin Mercy.    New Prescriptions New Prescriptions   No medications on file     Milagros Loll, MD 12/14/15 1551  Ezequiel Essex, MD 12/14/15 [phone]

## 2015-12-14 NOTE — Discharge Instructions (Addendum)
Please stay off of your left leg (non weight bearing). Schedule an appointment to see Dr. Lorin Mercy in his office.  You do have a blood clot in the left leg.  Our pharmacist as recommended increasing her Coumadin to 5 mg every evening. Stopped taking it 3 mg every evening. Your INR is 1.5 today and is subtherapeutic. Please have an INR rechecked at the rehabilitation facility on Monday or Tuesday and adjust Coumadin as needed from the ear. In the meantime please give herself Lovenox shots until your INR is therapeutic to treat blood clots  Return for worsening symptoms, including escalating pain, fever, increased swelling, bleeding (gi tract), confusion, or any other symptoms concerning to you.

## 2015-12-14 NOTE — ED Triage Notes (Signed)
Patient came in by [facility] with increase in left hip pain. Patient had left hip fx 2 weeks ago post fall and then had corrective surgery. Patient has been at the nursing facility and had increase in hip pain for the last week and half. Pain rated at 3 on scale 0-10. Patient states it hasn't been that bad. Patient takes oxycodone at facility and it helped a little. Patient states they did xray today and the facility wanted a second opinion and sent her here.

## 2015-12-14 NOTE — ED Notes (Signed)
Pt has been at facility for rehab for left hip surgery. Pt reports pain x 2 days, worse since she started physical therapy. 1 percocet at 1030 this morning w/o relief of pain.

## 2015-12-17 ENCOUNTER — Inpatient Hospital Stay (INDEPENDENT_AMBULATORY_CARE_PROVIDER_SITE_OTHER): Payer: Medicare Other | Admitting: Orthopaedic Surgery

## 2015-12-17 DIAGNOSIS — S72142A Displaced intertrochanteric fracture of left femur, initial encounter for closed fracture: Secondary | ICD-10-CM

## 2015-12-24 ENCOUNTER — Ambulatory Visit (INDEPENDENT_AMBULATORY_CARE_PROVIDER_SITE_OTHER): Payer: Medicare Other | Admitting: Neurology

## 2015-12-24 ENCOUNTER — Encounter: Payer: Self-pay | Admitting: Neurology

## 2015-12-24 ENCOUNTER — Telehealth: Payer: Self-pay | Admitting: Neurology

## 2015-12-24 VITALS — BP 128/55 | HR 67 | Ht 69.0 in

## 2015-12-24 DIAGNOSIS — R569 Unspecified convulsions: Secondary | ICD-10-CM | POA: Diagnosis not present

## 2015-12-24 DIAGNOSIS — I255 Ischemic cardiomyopathy: Secondary | ICD-10-CM | POA: Diagnosis not present

## 2015-12-24 MED ORDER — PHENYTOIN SODIUM EXTENDED 200 MG PO CAPS: 200.0000 mg | ORAL_CAPSULE | Freq: Two times a day (BID) | ORAL | 0 refills | Status: DC

## 2015-12-24 MED ORDER — LEVETIRACETAM ER 500 MG PO TB24: 500.0000 mg | ORAL_TABLET | Freq: Every day | ORAL | 1 refills | Status: DC

## 2015-12-24 NOTE — Telephone Encounter (Signed)
[name] [name]/[name] [name] [phone] called needing clarification on the orders for the patient

## 2015-12-24 NOTE — Telephone Encounter (Signed)
Rn spoke with Conley Rolls nurse at [facility] rehab. Aisha stated she needs clarification on the dilantin tapering. THe order form is hard to read with the carbon copy. Rn stated the pt was given a AVS. Rn stated per Dr. Leonie Man pt is to start keppra now daily. While on keppra she is take to reduce one tablet daily for 2 months, and one tablet every other day for 2 months. She does the one tablet of dilantin every other day for 2 months than stop. Pt will take keppra ongoing. Rn stated a copy of the office notes can be fax to [facility]. Fax was given of [phone] to attention Aisha Rn. Information was fax and receive.

## 2015-12-24 NOTE — Progress Notes (Signed)
PATIENT: [name] [name] DOB: 06-Jun-1940  REASON FOR VISIT: follow up for seizure HISTORY FROM: patient  HISTORY OF PRESENT ILLNESS: Update 07/14/13 (LL):  Since last visit, has struggled with hip pain since surgery, denies falls, ambulates with a cane.  No recurrent seizures, states taking Dilantin 100 ER QID but level last month was only 3.5, she takes 2 in the morning, 1 in the afternoon and 1 at bedtime.  Right hand tremor is stable. MMSE is stable at 26/30.  Update 09/27/12 (LL): Patient comes in for 6 month follow up for seizures. Doing well, had fractured right hip recently from fall in yard, status post surgery 07/15/12. Released from rehab to home 3 weeks ago. Ambulating with cane, doing well. No further seizures since initial episode. She is on Dilantin 150 mg 3 times a day and seems to the tolerating it well without any dizziness, imbalance, diplopia or dysarthria. Thinks that memory is stable, only notable trouble is remembering numbers. Still has mild tremor of the right hand, reports it is better with Ativan. No new complaints.   Update January 21st 2014 (PS): She returns for followup after last visit on 12/11/2011. She continues to do well without any recurrent seizures following the one during her initial hospitalization in March 2013. She is on Dilantin 150 mg 3 times a day and seems to the tolerating it well without any dizziness, imbalance, diplopia or dysarthria. She continues to have mild short-term memory difficulties which are mainly related to her new memory. She can remember remote incidents quite well. She also complains of mild action tremor of the right hand but it has not been disabling.  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 She also has mild diabetic peripheral neuropathy. Overall doing well. One  syncopal episode related to hypoglycemia.  Update 11/14/13 : She returns for followup of her last visit 4 months ago. She continues to do well without recurrent seizures since the initial one in March 2013. She is currently on Vantin 400 mg daily and seems to be covering it very well. She does have mild tremor of the left hand but denies dizziness, diplopia or vertigo sleepiness. She continues to have mild short-term memory difficulties but she seems to be coping quite well with them and these are not progressive. In fact she is still balancing checkbooks and writing checks and not making any mistakes. She has no new complaints today at Update 05/17/2014 : She returns for follow-up after last visit 6 months ago. Patient continues to do well without recurrent seizures. She has tolerated decrease in the Dilantin to 300 mg daily. She has been depressed and upset by her son's activities. He is been arrested for house breaking and is in jail. She has short-term memory difficulties and needs help even with cooking. She has never been tried on medications for depression and she is willing to try treatment. Update 11/28/2014 : She returns for follow-up after last visit 6 months ago accompanied by her husband. She has not had any recurrent seizures and continues to tolerate Dilantin 300 mg at night quite well without significant side effects. She continues to have depression despite being on Lexapro 10 mg and this is situational related to his son's activities is currently in rehabilitation and may have to go to jail. She is tolerating Lexapro well without any side effects. She  does have upcoming visit with her primary physician next month. She has a lot of itching in her hands and is scratching her forearms. and this may be stress related Update 05/30/2015 ;  Patient continues to well from seizure standpoint has not had a seizure now for a couple of years. She remains on Dilantin 300 mg a day and is tolerating it well  without side effects However she was hospitalized a few times last month. She was admitted with MI, systolic heart failure with ischemic cardiomyopathy and had a cardiac cath on 05/17/15 and underwent angioplasty. She had another admission for UTI with enterococcus and abdominal pain CT abdomen showed emphysematous cystitis and she had a urology and ID consult. He was treated with antibiotics and has been gradual improvement. She continues to walk with a wheeled walker for long distances. She has had no fortunately falls. She has no other complaints today. Update 12/24/2015 : She returns for follow-up after last visit 6 months ago. She is accompanied by husband. Patient has the unfortunate fall off 3-4 weeks ago and had hip fracture. She underwent surgery by Dr. Lorin Mercy. She is getting therapy and is currently in a significant rehabilitation facility. Patient had recent lab work done on 12/04/15 with phenytoin level being 3.4 total and free fentanyl been nondetectable. Patient states she's been compliant with her medications. She did have a questionable seizure back in July but in the setting of a bladder infection and level at that time on 7/15/17was 2.8 in Dilantin dose was increased from 300 to 400 mg daily. And follow-up level on 09/18/15 was 9.1. Patient is currently getting physical therapy and learning to walk following her hip surgery. She states she is cognitively stable without significant new memory or cognitive problems. She has no new complaints. REVIEW OF SYSTEMS: Full 14 system review of systems performed and notable only for:   ,   Walking difficulty, , fall, hip fracture, double vision only and all other systems negative   ALLERGIES: Allergies  Allergen Reactions  . Procaine Hcl Anaphylaxis    HOME MEDICATIONS: Outpatient Medications Prior to Visit  Medication Sig Dispense Refill  . atorvastatin (LIPITOR) 80 MG tablet Take 80 mg by mouth daily.    . calcium-vitamin D (OSCAL WITH D)  500-200 MG-UNIT per tablet Take 1 tablet by mouth daily.    . clopidogrel (PLAVIX) 75 MG tablet Take 1 tablet (75 mg total) by mouth daily. 30 tablet 4  . enoxaparin (LOVENOX) 30 MG/0.3ML injection Inject 0.94 mLs (94 mg total) into the skin every 12 (twelve) hours. 14 Syringe 0  . escitalopram (LEXAPRO) 10 MG tablet TAKE TWO TABLETS BY MOUTH ONCE DAILY 180 tablet 3  . ferrous sulfate 325 (65 FE) MG EC tablet Take 325 mg by mouth daily with breakfast.    . furosemide (LASIX) 40 MG tablet Take 1 tablet (40 mg total) by mouth 2 (two) times daily. 60 tablet 3  . insulin NPH-regular Human (NOVOLIN 70/30) (70-30) 100 UNIT/ML injection Inject 14-30 Units into the skin See admin instructions. Inject 30 units in the morning. 14 units-2 times daily with lunch and dinnner    . lisinopril (PRINIVIL,ZESTRIL) 5 MG tablet Take 1 tablet (5 mg total) by mouth daily. 30 tablet 5  . LORazepam (ATIVAN) 0.5 MG tablet Take 1 tablet (0.5 mg total) by mouth 2 (two) times daily as needed for anxiety. 60 tablet 0  . methocarbamol (ROBAXIN) 500 MG tablet Take 1 tablet (500 mg total) by mouth every  6 (six) hours as needed for muscle spasms. 60 tablet 0  . metoprolol succinate (TOPROL-XL) 50 MG 24 hr tablet Take 25 mg by mouth daily. Take with or immediately following a meal.    . Multiple Vitamins-Minerals (CENTRUM SILVER ADULT 50+ PO) Take 1 tablet by mouth every morning. Reported on 08/07/2015    . oxyCODONE-acetaminophen (ROXICET) 5-325 MG tablet Take 1 tablet by mouth every 6 (six) hours as needed for severe pain. 60 tablet 0  . pantoprazole (PROTONIX) 40 MG tablet Take 1 tablet (40 mg total) by mouth daily. 30 tablet 3  . promethazine (PHENERGAN) 12.5 MG tablet Take 1 tablet (12.5 mg total) by mouth every 6 (six) hours as needed for nausea or vomiting. 30 tablet 0  . protein supplement (PROSOURCE NO CARB) LIQD Take 30 mLs by mouth 2 (two) times daily.    [name] saccharomyces boulardii (FLORASTOR) 250 MG capsule Take 1 capsule  (250 mg total) by mouth 2 (two) times daily. 30 capsule 0  . warfarin (COUMADIN) 5 MG tablet Take 1 tablet (5 mg total) by mouth daily. 20 tablet 0  . phenytoin (DILANTIN) 200 MG ER capsule Take 1 capsule (200 mg total) by mouth 2 (two) times daily. 60 capsule 0   No facility-administered medications prior to visit.      PHYSICAL EXAM  Vitals:   12/24/15 1044  BP: (!) 128/55  BP Location: Left Arm  Patient Position: Sitting  Cuff Size: Normal  Pulse: 67  Height: 5\' 9"  (1.753 m)   There is no height or weight on file to calculate BMI. No exam data present   Generalized: In no acute distress,  pleasant elderly Caucasian female  Neck: Supple, no carotid bruits  Cardiac: Regular rate rhythm, no murmur  Pulmonary: Clear to auscultation bilaterally  Musculoskeletal: No deformity  SKIN : no rash Neurological examination  Mentation: Alert oriented to time, place, history taking, language fluent, and casual conversation.  Cranial nerve II-XII: Pupils were equal round reactive to light extraocular movements were full,  legally blind left eye   Facial sensation and strength were normal. hearing was intact to finger rubbing bilaterally. Uvula tongue midline. head turning and shoulder shrug and were normal and symmetric.Tongue protrusion into cheek strength was normal.  MOTOR: normal bulk and tone, full strength in the BUE, decreased strength 4/5 hip flexion RLE (due to hip surgery) , LLE 5/5 strength. fine finger movements normal, no pronator drift. Fine action and resting tremor of right hand. No cogwheel rigidity or bradykinesia.  SENSORY: normal and symmetric to light touch Vibration and position impaired over toes.  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 She also has mild diabetic peripheral neuropathy. Overall doing well.   PLAN :I had a long discussion the patient and her husband regarding her recent phenytoin level being low at 3.2 with free phenytoin levels being undetectable despite being on 400 mg of phenytoin daily. She's been compliant with it but has had suboptimal levels despite adequate dosing. This may perhaps related to interaction with warfarin which she needs. I recommend adding Keppra XR 500 mg daily and slowly tapering and discontinuing phenytoin over the next  4 months. Continue ongoing physical and occupational therapy for recent hip and ankle fractures. Greater than 50% time during this 25 minute visit was spent on counseling and coordination of care about seizure risk , prevention and treatment She will return for follow-up in 6 months with apractitioner or call earlier if necessary.   Meds ordered this encounter  Medications  . Insulin Aspart (NOVOLOG [place])    Sig: Inject into the skin.  [name] levETIRAcetam (KEPPRA XR) 500 MG 24 hr tablet    Sig: Take 1 tablet (500 mg total) by mouth daily.    Dispense:  90 tablet    Refill:  1  . phenytoin (DILANTIN) 200 MG ER capsule    Sig: Take 1 capsule (200 mg total) by mouth 2 (two) times daily. Reduce to 1 tablet daily x 2 months then every other day x 2 months and stop    Dispense:  60 capsule    Refill:  0   Return in about 6 months (around 06/23/2016).  Antony Contras, MD  12/24/2015, 11:12 AM [facility] [address] [place], [place] [address] [phone]  Note: This document was prepared with digital dictation and possible smart phrase technology. Any transcriptional errors that result from this process are unintentional.

## 2015-12-25 ENCOUNTER — Encounter (INDEPENDENT_AMBULATORY_CARE_PROVIDER_SITE_OTHER): Payer: Self-pay | Admitting: Orthopaedic Surgery

## 2015-12-25 ENCOUNTER — Ambulatory Visit (INDEPENDENT_AMBULATORY_CARE_PROVIDER_SITE_OTHER): Payer: Medicare Other | Admitting: Orthopaedic Surgery

## 2015-12-25 VITALS — BP 113/59 | HR 65 | Ht 69.0 in | Wt 207.0 lb

## 2015-12-25 DIAGNOSIS — S72142S Displaced intertrochanteric fracture of left femur, sequela: Secondary | ICD-10-CM

## 2015-12-25 DIAGNOSIS — S82899D Other fracture of unspecified lower leg, subsequent encounter for closed fracture with routine healing: Secondary | ICD-10-CM

## 2015-12-25 NOTE — Progress Notes (Signed)
Office Visit Note   Patient: [name] [name]           Date of Birth: [birth year]           MRN: [id number] Visit Date: 12/25/2015              Requested by: Colon Branch, MD [address] [place], [place] [address] PCP: [name], MD   Assessment & Plan: Visit Diagnoses:  1. Closed intertrochanteric fracture of hip, left, sequela   2. Closed fracture of ankle with routine healing, unspecified laterality, subsequent encounter     Plan:Return as scheduled in late November. Repeat x-rays of left hip on return AP and frog-leg lateral test of the hip joint  Follow-Up Instructions: Return in about 4 weeks (around 01/22/2016).   Orders:  No orders of the defined types were placed in this encounter.  Meds ordered this encounter  Medications  . doxycycline (DORYX) 100 MG EC tablet    Sig: Take 100 mg by mouth 2 (two) times daily.      Procedures: No procedures performed   Clinical Data: No additional findings.   Subjective: Chief Complaint  Patient presents with  . Left Hip - Routine Post Op, Follow-up    Patient is status post Left troch nail for Left IT hip fracture on 12/02/15. She is 3 weeks 2 days post op. She is back for a one week wound check. Patient states that she is doing better than last week. Patient is currently residing at [facility]. She states the physical therapist put her CAM boot on her yesterday and had her get up to walk. She was unable to ambulate in the CAM boot, but was able to walk without it.  She would like to DC the bilat CAM boots for prior ankle fractures if possible.    Review of Systems   Objective: Vital Signs: BP (!) 113/59 (BP Location: Left Arm)   Pulse 65   Ht 5\' 9"  (1.753 m)   Wt 207 lb (93.9 kg)   BMI 30.57 kg/m   Physical Exam  Constitutional: She is oriented to person, place, and time. She appears well-developed.  HENT:  Head: Normocephalic.  Right Ear: External ear normal.  Left Ear: External ear  normal.  Eyes: Pupils are equal, round, and reactive to light.  Neck: No tracheal deviation present. No thyromegaly present.  Cardiovascular: Normal rate.   Pulmonary/Chest: Effort normal.  Abdominal: Soft.  Neurological: She is alert and oriented to person, place, and time.  Skin: Skin is warm and dry.  Psychiatric: She has a normal mood and affect. Her behavior is normal.    Ortho Exam hip incision well healed. She is a lambert without CAM boot but with ambulation with CAM walker she feels is very heavy is not able take any steps. Distal pulses are intact no pain with hip range of motion.  Specialty Comments:  No specialty comments available.  Imaging: No results found.   PMFS History: Patient Active Problem List   Diagnosis Date Noted  . Closed intertrochanteric fracture of hip, left, sequela 12/25/2015  . Hip fracture ([place]) 12/01/2015  . Anemia 12/01/2015  . Leukocytosis 12/01/2015  . Ankle fracture, left 09/17/2015  . Ankle fracture, bimalleolar, closed   . Bilateral fibular fractures 09/14/2015  . Hypertensive heart disease 05/20/2015  . CAD (coronary artery disease) 05/20/2015  . Ischemic cardiomyopathy 05/20/2015  . Acute systolic CHF (congestive heart failure) ([place]) 05/20/2015  . PAF (  paroxysmal atrial fibrillation) ([place]) 05/20/2015  . Anticoagulated on Coumadin 05/20/2015  . Type 2 diabetes mellitus with vascular disease ([place]) 05/17/2015  . NSTEMI (non-ST elevated myocardial infarction) ([place])   . Bacteremia due to Escherichia coli 05/11/2015  . Acute kidney injury superimposed on chronic kidney disease ([place]) 05/11/2015  . Uncontrolled hypertension 05/11/2015  . 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[place]) 18/84/1660  . Atrial fibrillation with RVR ([place]) 05/06/2015  . PCP NOTES >>>>>>>>>>>>>>>> 12/28/2014  . Depression 05/17/2014  . Memory loss 05/17/2014  . Diabetes mellitus-PCP comments 01/23/2014  . A-fib ([place]) 01/23/2014  . Anxiety and depression, PCP notes  11/24/2013  . Severe  obesity (BMI >= 40) ([place]) 07/14/2013  . Encounter for therapeutic drug monitoring 06/02/2013  . Dilantin toxicity 11/21/2012  . History of encephalitis 11/18/2012  . Localization-related (focal) (partial) epilepsy and epileptic syndromes with complex partial seizures, without mention of intractable epilepsy 09/27/2012  . Hyponatremia 07/16/2012  . Intertrochanteric fracture of right hip ([place]) 07/13/2012  . DM (diabetes mellitus) ([place]) 01/19/2012  . GERD (gastroesophageal reflux disease) 10/05/2011  . Shoulder pain 08/27/2011  . Seizure disorder ([place]) 05/24/2011  . UTI (lower urinary tract infection) 05/23/2011  . UTI (urinary tract infection) 05/22/2011  . Annual physical exam 02/10/2011  . Long term (current) use of anticoagulants 09/23/2010  . Atrial fibrillation ([place])   . 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 Hyperlipidemia 03/27/2006  . PERIPHERAL NEUROPATHY 03/27/2006  . 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 Essential hypertension 03/27/2006  . OSTEOPENIA 03/27/2006   Past Medical History:  Diagnosis Date  . Atrial fibrillation ([place])    SVT dx 2007, cath 2007 mild CAD, then had an cardioversion, ablation; still on coumadin , has occ palpitation, EKG 03-2010 NSR  . Blindness of left eye   . DIABETES MELLITUS, TYPE II 03/27/2006   dr [name]  . Eye muscle weakness    Right eye weakness after cataract surgery  . GERD (gastroesophageal reflux disease) 10/05/2011  . 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 HYPERLIPIDEMIA 03/27/2006  . HYPERTENSION 03/27/2006  . Intertrochanteric fracture of right hip ([place]) 07/13/2012  . 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 Osteopenia 2004   Dexa 2004 showed Osteopenia, DEXA 03/2007 normal  . Osteopenia   . Other chronic cystitis with hematuria   . Recurrent urinary tract infection    Seeing Urology  . 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 Seizures ([place])   . 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[place]) 08/30/1599    Family History  Problem Relation Age of Onset  . Diabetes  Father   . Heart attack Father 1  . Diabetes Sister   . Cancer Neg Hx     no hx of colon or breast cancer    Past Surgical History:  Procedure Laterality Date  . CARDIAC CATHETERIZATION N/A 05/17/2015   Procedure: Left Heart Cath and Coronary Angiography;  Surgeon: Jettie Booze, MD;  Location: [facility];  Service: Cardiovascular;  Laterality: N/A;  . CARDIAC CATHETERIZATION N/A 05/17/2015   Procedure: Coronary Balloon Angioplasty;  Surgeon: Jettie Booze, MD;  Location: [facility];  Service: Cardiovascular;  Laterality: N/A;  . FEMUR IM NAIL Right 07/15/2012   Procedure: INTRAMEDULLARY (IM) NAIL HIP;  Surgeon: Johnny Bridge, MD;  Location: [facility];  Service: Orthopedics;  Laterality: Right;  . FEMUR IM NAIL Left 12/02/2015   Procedure: INTRAMEDULLARY (IM) NAIL FEMORAL;  Surgeon: [name], MD;  Location: [facility];  Service: Orthopedics;  Laterality: Left;  . SHOULDER SURGERY  Social History   Occupational History  . retired  Retired   Social History Main Topics  . Smoking status: Former Smoker    Types: Cigarettes    Quit date: 03/02/1978  . Smokeless tobacco: Never Used  . Alcohol use No  . Drug use: No  . Sexual activity: No

## 2016-01-02 ENCOUNTER — Encounter (INDEPENDENT_AMBULATORY_CARE_PROVIDER_SITE_OTHER): Payer: Self-pay | Admitting: Orthopedic Surgery

## 2016-01-02 ENCOUNTER — Inpatient Hospital Stay (HOSPITAL_COMMUNITY): Payer: Medicare Other

## 2016-01-02 ENCOUNTER — Ambulatory Visit (INDEPENDENT_AMBULATORY_CARE_PROVIDER_SITE_OTHER): Payer: Medicare Other | Admitting: Orthopedic Surgery

## 2016-01-02 ENCOUNTER — Inpatient Hospital Stay (HOSPITAL_COMMUNITY)
Admission: RE | Admit: 2016-01-02 | Discharge: 2016-01-06 | DRG: 857 | Disposition: A | Discharge status: Skilled Nursing Facility | Payer: Medicare Other | Source: Ambulatory Visit | Attending: Orthopaedic Surgery | Admitting: Orthopaedic Surgery

## 2016-01-02 ENCOUNTER — Encounter (HOSPITAL_COMMUNITY): Payer: Self-pay | Admitting: General Practice

## 2016-01-02 ENCOUNTER — Encounter: Payer: Self-pay | Admitting: Orthopedic Surgery

## 2016-01-02 DIAGNOSIS — I10 Essential (primary) hypertension: Secondary | ICD-10-CM | POA: Diagnosis present

## 2016-01-02 DIAGNOSIS — Z8614 Personal history of Methicillin resistant Staphylococcus aureus infection: Secondary | ICD-10-CM

## 2016-01-02 DIAGNOSIS — T8469XD Infection and inflammatory reaction due to internal fixation device of other site, subsequent encounter: Secondary | ICD-10-CM | POA: Diagnosis not present

## 2016-01-02 DIAGNOSIS — I480 Paroxysmal atrial fibrillation: Secondary | ICD-10-CM | POA: Diagnosis present

## 2016-01-02 DIAGNOSIS — K219 Gastro-esophageal reflux disease without esophagitis: Secondary | ICD-10-CM | POA: Diagnosis present

## 2016-01-02 DIAGNOSIS — M96840 Postprocedural hematoma of a musculoskeletal structure following a musculoskeletal system procedure: Secondary | ICD-10-CM | POA: Diagnosis present

## 2016-01-02 DIAGNOSIS — D509 Iron deficiency anemia, unspecified: Secondary | ICD-10-CM | POA: Diagnosis present

## 2016-01-02 DIAGNOSIS — I130 Hypertensive heart and chronic kidney disease with heart failure and stage 1 through stage 4 chronic kidney disease, or unspecified chronic kidney disease: Secondary | ICD-10-CM | POA: Diagnosis present

## 2016-01-02 DIAGNOSIS — I4891 Unspecified atrial fibrillation: Secondary | ICD-10-CM | POA: Diagnosis present

## 2016-01-02 DIAGNOSIS — A499 Bacterial infection, unspecified: Secondary | ICD-10-CM

## 2016-01-02 DIAGNOSIS — B964 Proteus (mirabilis) (morganii) as the cause of diseases classified elsewhere: Secondary | ICD-10-CM | POA: Diagnosis present

## 2016-01-02 DIAGNOSIS — Y792 Prosthetic and other implants, materials and accessory orthopedic devices associated with adverse incidents: Secondary | ICD-10-CM | POA: Diagnosis not present

## 2016-01-02 DIAGNOSIS — M8580 Other specified disorders of bone density and structure, unspecified site: Secondary | ICD-10-CM | POA: Diagnosis present

## 2016-01-02 DIAGNOSIS — I82412 Acute embolism and thrombosis of left femoral vein: Secondary | ICD-10-CM | POA: Diagnosis present

## 2016-01-02 DIAGNOSIS — W19XXXD Unspecified fall, subsequent encounter: Secondary | ICD-10-CM | POA: Diagnosis present

## 2016-01-02 DIAGNOSIS — S72142D Displaced intertrochanteric fracture of left femur, subsequent encounter for closed fracture with routine healing: Secondary | ICD-10-CM

## 2016-01-02 DIAGNOSIS — I2510 Atherosclerotic heart disease of native coronary artery without angina pectoris: Secondary | ICD-10-CM | POA: Diagnosis present

## 2016-01-02 DIAGNOSIS — B9562 Methicillin resistant Staphylococcus aureus infection as the cause of diseases classified elsewhere: Secondary | ICD-10-CM | POA: Diagnosis not present

## 2016-01-02 DIAGNOSIS — Z833 Family history of diabetes mellitus: Secondary | ICD-10-CM

## 2016-01-02 DIAGNOSIS — Y838 Other surgical procedures as the cause of abnormal reaction of the patient, or of later complication, without mention of misadventure at the time of the procedure: Secondary | ICD-10-CM | POA: Diagnosis not present

## 2016-01-02 DIAGNOSIS — Z7901 Long term (current) use of anticoagulants: Secondary | ICD-10-CM

## 2016-01-02 DIAGNOSIS — I255 Ischemic cardiomyopathy: Secondary | ICD-10-CM | POA: Diagnosis present

## 2016-01-02 DIAGNOSIS — T814XXA Infection following a procedure, initial encounter: Principal | ICD-10-CM | POA: Diagnosis present

## 2016-01-02 DIAGNOSIS — T814XXS Infection following a procedure, sequela: Secondary | ICD-10-CM | POA: Diagnosis not present

## 2016-01-02 DIAGNOSIS — Z79899 Other long term (current) drug therapy: Secondary | ICD-10-CM

## 2016-01-02 DIAGNOSIS — Z86718 Personal history of other venous thrombosis and embolism: Secondary | ICD-10-CM

## 2016-01-02 DIAGNOSIS — D62 Acute posthemorrhagic anemia: Secondary | ICD-10-CM | POA: Diagnosis not present

## 2016-01-02 DIAGNOSIS — S72141G Displaced intertrochanteric fracture of right femur, subsequent encounter for closed fracture with delayed healing: Secondary | ICD-10-CM | POA: Diagnosis not present

## 2016-01-02 DIAGNOSIS — H5462 Unqualified visual loss, left eye, normal vision right eye: Secondary | ICD-10-CM | POA: Diagnosis present

## 2016-01-02 DIAGNOSIS — Z01818 Encounter for other preprocedural examination: Secondary | ICD-10-CM

## 2016-01-02 DIAGNOSIS — G40909 Epilepsy, unspecified, not intractable, without status epilepticus: Secondary | ICD-10-CM

## 2016-01-02 DIAGNOSIS — E11319 Type 2 diabetes mellitus with unspecified diabetic retinopathy without macular edema: Secondary | ICD-10-CM | POA: Diagnosis present

## 2016-01-02 DIAGNOSIS — B9689 Other specified bacterial agents as the cause of diseases classified elsewhere: Secondary | ICD-10-CM | POA: Diagnosis not present

## 2016-01-02 DIAGNOSIS — Z794 Long term (current) use of insulin: Secondary | ICD-10-CM

## 2016-01-02 DIAGNOSIS — I5022 Chronic systolic (congestive) heart failure: Secondary | ICD-10-CM | POA: Diagnosis present

## 2016-01-02 DIAGNOSIS — Z8249 Family history of ischemic heart disease and other diseases of the circulatory system: Secondary | ICD-10-CM

## 2016-01-02 DIAGNOSIS — G609 Hereditary and idiopathic neuropathy, unspecified: Secondary | ICD-10-CM | POA: Diagnosis present

## 2016-01-02 DIAGNOSIS — E1122 Type 2 diabetes mellitus with diabetic chronic kidney disease: Secondary | ICD-10-CM | POA: Diagnosis present

## 2016-01-02 DIAGNOSIS — N183 Chronic kidney disease, stage 3 unspecified: Secondary | ICD-10-CM

## 2016-01-02 DIAGNOSIS — T814XXD Infection following a procedure, subsequent encounter: Secondary | ICD-10-CM | POA: Diagnosis not present

## 2016-01-02 DIAGNOSIS — E876 Hypokalemia: Secondary | ICD-10-CM | POA: Diagnosis present

## 2016-01-02 DIAGNOSIS — B999 Unspecified infectious disease: Secondary | ICD-10-CM | POA: Diagnosis not present

## 2016-01-02 DIAGNOSIS — E118 Type 2 diabetes mellitus with unspecified complications: Secondary | ICD-10-CM

## 2016-01-02 DIAGNOSIS — R262 Difficulty in walking, not elsewhere classified: Secondary | ICD-10-CM

## 2016-01-02 DIAGNOSIS — Z87440 Personal history of urinary (tract) infections: Secondary | ICD-10-CM

## 2016-01-02 DIAGNOSIS — R29898 Other symptoms and signs involving the musculoskeletal system: Secondary | ICD-10-CM

## 2016-01-02 DIAGNOSIS — L03116 Cellulitis of left lower limb: Secondary | ICD-10-CM | POA: Diagnosis not present

## 2016-01-02 DIAGNOSIS — E785 Hyperlipidemia, unspecified: Secondary | ICD-10-CM | POA: Diagnosis present

## 2016-01-02 DIAGNOSIS — Z87891 Personal history of nicotine dependence: Secondary | ICD-10-CM

## 2016-01-02 DIAGNOSIS — T8149XA Infection following a procedure, other surgical site, initial encounter: Secondary | ICD-10-CM | POA: Diagnosis present

## 2016-01-02 DIAGNOSIS — Z9861 Coronary angioplasty status: Secondary | ICD-10-CM

## 2016-01-02 LAB — CBC WITH DIFFERENTIAL/PLATELET
BASOS PCT: 0 %
Basophils Absolute: 0 10*3/uL (ref 0.0–0.1)
EOS ABS: 0 10*3/uL (ref 0.0–0.7)
EOS PCT: 0 %
HCT: 28.2 % — ABNORMAL LOW (ref 36.0–46.0)
Hemoglobin: 9.3 g/dL — ABNORMAL LOW (ref 12.0–15.0)
Lymphocytes Relative: 8 %
Lymphs Abs: 1.5 10*3/uL (ref 0.7–4.0)
MCH: 30.1 pg (ref 26.0–34.0)
MCHC: 33 g/dL (ref 30.0–36.0)
MCV: 91.3 fL (ref 78.0–100.0)
MONO ABS: 1.9 10*3/uL — AB (ref 0.1–1.0)
MONOS PCT: 10 %
Neutro Abs: 15.5 10*3/uL — ABNORMAL HIGH (ref 1.7–7.7)
Neutrophils Relative %: 82 %
Platelets: 316 10*3/uL (ref 150–400)
RBC: 3.09 MIL/uL — ABNORMAL LOW (ref 3.87–5.11)
RDW: 13.8 % (ref 11.5–15.5)
WBC: 18.9 10*3/uL — ABNORMAL HIGH (ref 4.0–10.5)

## 2016-01-02 LAB — COMPREHENSIVE METABOLIC PANEL
ALBUMIN: 2.4 g/dL — AB (ref 3.5–5.0)
ALK PHOS: 181 U/L — AB (ref 38–126)
ALT: 11 U/L — AB (ref 14–54)
AST: 22 U/L (ref 15–41)
Anion gap: 12 (ref 5–15)
BUN: 40 mg/dL — AB (ref 6–20)
CALCIUM: 9 mg/dL (ref 8.9–10.3)
CO2: 24 mmol/L (ref 22–32)
CREATININE: 1.94 mg/dL — AB (ref 0.44–1.00)
Chloride: 96 mmol/L — ABNORMAL LOW (ref 101–111)
GFR calc non Af Amer: 24 mL/min — ABNORMAL LOW (ref >60)
GFR, EST AFRICAN AMERICAN: 28 mL/min — AB (ref >60)
GLUCOSE: 174 mg/dL — AB (ref 65–99)
Potassium: 3.6 mmol/L (ref 3.5–5.1)
SODIUM: 132 mmol/L — AB (ref 135–145)
Total Bilirubin: 0.7 mg/dL (ref 0.3–1.2)
Total Protein: 6.5 g/dL (ref 6.5–8.1)

## 2016-01-02 LAB — C-REACTIVE PROTEIN: CRP: 21.1 mg/dL — ABNORMAL HIGH (ref <1.0)

## 2016-01-02 LAB — PROTIME-INR
INR: 3.24
PROTHROMBIN TIME: 33.8 s — AB (ref 11.4–15.2)

## 2016-01-02 LAB — SEDIMENTATION RATE: Sed Rate: 110 mm/hr — ABNORMAL HIGH (ref 0–22)

## 2016-01-02 LAB — APTT: APTT: 60 s — AB (ref 24–36)

## 2016-01-02 LAB — GLUCOSE, CAPILLARY
GLUCOSE-CAPILLARY: 207 mg/dL — AB (ref 65–99)
Glucose-Capillary: 162 mg/dL — ABNORMAL HIGH (ref 65–99)

## 2016-01-02 MED ORDER — INSULIN ASPART 100 UNIT/ML ~~LOC~~ SOLN
0.0000 [IU] | Freq: Three times a day (TID) | SUBCUTANEOUS | Status: DC
  Administered 2016-01-03: 2 [IU] via SUBCUTANEOUS
  Administered 2016-01-03: 3 [IU] via SUBCUTANEOUS
  Administered 2016-01-04 (×2): 5 [IU] via SUBCUTANEOUS
  Administered 2016-01-04: 3 [IU] via SUBCUTANEOUS
  Administered 2016-01-05 – 2016-01-06 (×2): 2 [IU] via SUBCUTANEOUS

## 2016-01-02 MED ORDER — ESCITALOPRAM OXALATE 10 MG PO TABS
10.0000 mg | ORAL_TABLET | Freq: Every day | ORAL | Status: DC
  Administered 2016-01-02 – 2016-01-06 (×5): 10 mg via ORAL
  Filled 2016-01-02 (×4): qty 1

## 2016-01-02 MED ORDER — HYDROMORPHONE HCL 2 MG/ML IJ SOLN: 1.0000 mg | INTRAMUSCULAR | Status: DC | PRN

## 2016-01-02 MED ORDER — PANTOPRAZOLE SODIUM 40 MG PO TBEC
40.0000 mg | DELAYED_RELEASE_TABLET | Freq: Every day | ORAL | Status: DC
  Administered 2016-01-04 – 2016-01-06 (×3): 40 mg via ORAL
  Filled 2016-01-02 (×4): qty 1

## 2016-01-02 MED ORDER — OXYCODONE HCL 5 MG PO TABS: 5.0000 mg | ORAL_TABLET | ORAL | Status: DC | PRN

## 2016-01-02 MED ORDER — DOCUSATE SODIUM 100 MG PO CAPS: 100.0000 mg | ORAL_CAPSULE | Freq: Two times a day (BID) | ORAL | Status: DC

## 2016-01-02 MED ORDER — ATORVASTATIN CALCIUM 80 MG PO TABS
80.0000 mg | ORAL_TABLET | Freq: Every day | ORAL | Status: DC
  Administered 2016-01-02 – 2016-01-05 (×4): 80 mg via ORAL
  Filled 2016-01-02 (×4): qty 1

## 2016-01-02 MED ORDER — LEVETIRACETAM ER 500 MG PO TB24
500.0000 mg | ORAL_TABLET | Freq: Every day | ORAL | Status: DC
  Administered 2016-01-02 – 2016-01-06 (×5): 500 mg via ORAL
  Filled 2016-01-02 (×5): qty 1

## 2016-01-02 MED ORDER — SODIUM CHLORIDE 0.9 % IV SOLN
INTRAVENOUS | Status: DC
  Administered 2016-01-02 – 2016-01-03 (×2): via INTRAVENOUS

## 2016-01-02 MED ORDER — PIPERACILLIN-TAZOBACTAM 3.375 G IVPB
3.3750 g | Freq: Three times a day (TID) | INTRAVENOUS | Status: DC
  Administered 2016-01-02 – 2016-01-03 (×3): 3.375 g via INTRAVENOUS
  Filled 2016-01-02 (×6): qty 50

## 2016-01-02 MED ORDER — METOPROLOL SUCCINATE ER 25 MG PO TB24
25.0000 mg | ORAL_TABLET | Freq: Every day | ORAL | Status: DC
  Administered 2016-01-02 – 2016-01-06 (×3): 25 mg via ORAL
  Filled 2016-01-02 (×4): qty 1

## 2016-01-02 MED ORDER — INSULIN ASPART PROT & ASPART (70-30 MIX) 100 UNIT/ML ~~LOC~~ SUSP
15.0000 [IU] | Freq: Two times a day (BID) | SUBCUTANEOUS | Status: DC
  Filled 2016-01-02: qty 10

## 2016-01-02 MED ORDER — VANCOMYCIN HCL IN DEXTROSE 750-5 MG/150ML-% IV SOLN
750.0000 mg | Freq: Two times a day (BID) | INTRAVENOUS | Status: DC
  Administered 2016-01-02 – 2016-01-04 (×4): 750 mg via INTRAVENOUS
  Filled 2016-01-02 (×6): qty 150

## 2016-01-02 MED ORDER — ONDANSETRON HCL 4 MG/2ML IJ SOLN
4.0000 mg | Freq: Four times a day (QID) | INTRAMUSCULAR | Status: DC | PRN
  Administered 2016-01-02: 4 mg via INTRAVENOUS
  Filled 2016-01-02: qty 2

## 2016-01-02 MED ORDER — PHENYTOIN SODIUM EXTENDED 100 MG PO CAPS
200.0000 mg | ORAL_CAPSULE | Freq: Two times a day (BID) | ORAL | Status: DC
  Administered 2016-01-02 – 2016-01-06 (×8): 200 mg via ORAL
  Filled 2016-01-02 (×9): qty 2

## 2016-01-02 NOTE — H&P (Signed)
[name] [name] is an 75 y.o. female.   Chief Complaint: Left hip infection HPI: Patient returned to our office today for follow-up. I received a call from [facility] regarding left hip redness and drainage. Patient has also been having increased pain throughout the hip. She is status post left hip IM nail for hip fracture to October 2017. Patient had been on doxycycline but this was continued due to severe GI upset.  Past Medical History:  Diagnosis Date  . Atrial fibrillation ([place])    SVT dx 2007, cath 2007 mild CAD, then had an cardioversion, ablation; still on coumadin , has occ palpitation, EKG 03-2010 NSR  . Blindness of left eye   . DIABETES MELLITUS, TYPE II 03/27/2006   dr [name]  . Eye muscle weakness    Right eye weakness after cataract surgery  . GERD (gastroesophageal reflux disease) 10/05/2011  . 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 HYPERLIPIDEMIA 03/27/2006  . HYPERTENSION 03/27/2006  . Intertrochanteric fracture of right hip ([place]) 07/13/2012  . 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 Osteopenia 2004   Dexa 2004 showed Osteopenia, DEXA 03/2007 normal  . Osteopenia   . Other chronic cystitis with hematuria   . Recurrent urinary tract infection    Seeing Urology  . 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 Seizures ([place])   . 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[place]) 5/57/3220    Past Surgical History:  Procedure Laterality Date  . CARDIAC CATHETERIZATION N/A 05/17/2015   Procedure: Left Heart Cath and Coronary Angiography;  Surgeon: Jettie Booze, MD;  Location: [facility];  Service: Cardiovascular;  Laterality: N/A;  . CARDIAC CATHETERIZATION N/A 05/17/2015   Procedure: Coronary Balloon Angioplasty;  Surgeon: Jettie Booze, MD;  Location: [facility];  Service: Cardiovascular;  Laterality: N/A;  . FEMUR IM NAIL Right 07/15/2012   Procedure: INTRAMEDULLARY (IM) NAIL HIP;  Surgeon: Johnny Bridge, MD;  Location: [facility];  Service: Orthopedics;  Laterality:  Right;  . FEMUR IM NAIL Left 12/02/2015   Procedure: INTRAMEDULLARY (IM) NAIL FEMORAL;  Surgeon: [name], MD;  Location: [facility];  Service: Orthopedics;  Laterality: Left;  . SHOULDER SURGERY      Family History  Problem Relation Age of Onset  . Diabetes Father   . Heart attack Father 27  . Diabetes Sister   . Cancer Neg Hx     no hx of colon or breast cancer   Social History:  reports that she quit smoking about 37 years ago. Her smoking use included Cigarettes. She has never used smokeless tobacco. She reports that she does not drink alcohol or use drugs.  Allergies:  Allergies  Allergen Reactions  . Procaine Hcl Anaphylaxis    No prescriptions prior to admission.    No results found for this or any previous visit (from the past 48 hour(s)). No results found.  Review of Systems  Constitutional: Positive for malaise/fatigue.  HENT: Negative.   Respiratory: Negative.   Cardiovascular: Negative.   Genitourinary: Negative.   Musculoskeletal: Positive for joint pain.  Skin: Negative.   Neurological: Positive for weakness.  Psychiatric/Behavioral: Negative.     There were no vitals taken for this visit. Physical Exam  Constitutional: She is oriented to person, place, and time. No distress.  HENT:  Head: Normocephalic and atraumatic.  Eyes: EOM are normal. Pupils are equal, round, and reactive to light.  Neck: Normal range of motion.  Respiratory: No respiratory distress.  GI: She exhibits  no distension.  Neurological: She is alert and oriented to person, place, and time.  Skin: Skin is warm.  Psychiatric: She has a normal mood and affect.    She is tender palpation around her left hip and thigh. Redness around her incision she does have serous drainage from a small open area about half a centimeter no purulent drainage. Assessment/Plan Left hip postop infection. Status post IM nail for hip fracture. We'll admit patient to the hospital today. Plan for left hip I&D  tomorrow. We'll start pharmacy protocol IV vanc and Zosyn.  We did send cultures to the lab today from the clinic. Admission orders completed.  NPO after midnight. Will ask medicine service to follow along for multiple medical issues.   Benjiman Core, PA-C 01/02/2016, 3:25 PM

## 2016-01-02 NOTE — Addendum Note (Signed)
Addended by: Maxcine Ham on: 01/02/2016 05:41 PM   Modules accepted: Orders

## 2016-01-02 NOTE — Consult Note (Signed)
Medical Consultation   [name] [name]  RFF:[id number]  DOB: March 25, 1940  DOA: 01/02/2016  PCP: [name], MD  Requesting physician: Jerry Caras Reason for consultation: Medical management  History of Present Illness: [name] [name] is an 75 y.o. female with past medical history of diabetes on insulin, paroxysmal atrial fibrillation on Coumadin, history of chronic systolic CHF, seizure disorder, GERD, hypertension he she underwent a left hip IM nail for hip fracture on 12/01/2005 exactly 1 month ago and subsequently was discharged to SNF for rehabitation. There she was noted to have increased pain, swelling and discharge at the operative site and was seen in the office urgently today by Orthopedics and felt to have a post op infection, and admitted for I&D tomorrow. She was seen in the ER on 10/14 due to increased pain and it was recommended to FU with Ortho in the office. During that visit she was also found to have an acute DVT of L femoral vein and her coumadin dose was adjusted at discharge. TR[facility] was consulted today for medical management  Review of Systems:  ROS As per HPI otherwise, all other systems reviewed and are negative    Past Medical History: Past Medical History:  Diagnosis Date  . Atrial fibrillation ([place])    SVT dx 2007, cath 2007 mild CAD, then had an cardioversion, ablation; still on coumadin , has occ palpitation, EKG 03-2010 NSR  . Blindness of left eye   . DIABETES MELLITUS, TYPE II 03/27/2006   dr [name]  . Eye muscle weakness    Right eye weakness after cataract surgery  . GERD (gastroesophageal reflux disease) 10/05/2011  . 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 HYPERLIPIDEMIA 03/27/2006  . HYPERTENSION 03/27/2006  . Intertrochanteric fracture of right hip ([place]) 07/13/2012  . 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 Osteopenia 2004   Dexa 2004 showed Osteopenia, DEXA 03/2007 normal  . Osteopenia   . Other chronic cystitis with hematuria     . Recurrent urinary tract infection    Seeing Urology  . 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 Seizures ([place])   . 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[place]) 3/57/0177    Past Surgical History: Past Surgical History:  Procedure Laterality Date  . CARDIAC CATHETERIZATION N/A 05/17/2015   Procedure: Left Heart Cath and Coronary Angiography;  Surgeon: Jettie Booze, MD;  Location: [facility];  Service: Cardiovascular;  Laterality: N/A;  . CARDIAC CATHETERIZATION N/A 05/17/2015   Procedure: Coronary Balloon Angioplasty;  Surgeon: Jettie Booze, MD;  Location: [facility];  Service: Cardiovascular;  Laterality: N/A;  . FEMUR IM NAIL Right 07/15/2012   Procedure: INTRAMEDULLARY (IM) NAIL HIP;  Surgeon: Johnny Bridge, MD;  Location: [facility];  Service: Orthopedics;  Laterality: Right;  . FEMUR IM NAIL Left 12/02/2015   Procedure: INTRAMEDULLARY (IM) NAIL FEMORAL;  Surgeon: [name], MD;  Location: [facility];  Service: Orthopedics;  Laterality: Left;  . SHOULDER SURGERY       Allergies:   Allergies  Allergen Reactions  . Procaine Hcl Anaphylaxis     Social History:  reports that she quit smoking about 37 years ago. Her smoking use included Cigarettes. She has never used smokeless tobacco. She reports that she does not drink alcohol or use drugs.   Family History: Family History  Problem Relation Age of Onset  . Diabetes Father   . Heart attack Father 80  .  Diabetes Sister   . Cancer Neg Hx     no hx of colon or breast cancer     Physical Exam: There were no vitals filed for this visit.  Constitutional: Alert and awake, oriented x3, not in any acute distress. HEENT:: PERLA, EOMI,oral mucosa moist and pink CVS: S1-S2 clear, no murmur rubs or gallops, no LE edema, normal pedal pulses  Respiratory:  clear to auscultation bilaterally, no wheezing, rales or rhonchi. Respiratory effort normal. No accessory muscle use.  Abdomen: soft nontender, nondistended, normal bowel sounds,  no hepatosplenomegaly, no hernias  Musculoskeletal: : no cyanosis, clubbing or edema noted bilaterally L Hip with redness/swelling and minimal serous discharge nored Neuro: non focal Psych: judgement and insight appear normal, stable mood and affect, mental status Skin: inflammation at the surgical site on L hip  Data reviewed:  I have personally reviewed following labs and imaging studies Labs:  CBC: No results for input(s): WBC, NEUTROABS, HGB, HCT, MCV, PLT in the last 168 hours.  Basic Metabolic Panel: No results for input(s): NA, K, CL, CO2, GLUCOSE, BUN, CREATININE, CALCIUM, MG, PHOS in the last 168 hours. GFR Estimated Creatinine Clearance: 42.7 mL/min (by C-G formula based on SCr of 1.41 mg/dL (H)). Liver Function Tests: No results for input(s): AST, ALT, ALKPHOS, BILITOT, PROT, ALBUMIN in the last 168 hours. No results for input(s): LIPASE, AMYLASE in the last 168 hours. No results for input(s): AMMONIA in the last 168 hours. Coagulation profile No results for input(s): INR, PROTIME in the last 168 hours.  Cardiac Enzymes: No results for input(s): CKTOTAL, CKMB, CKMBINDEX, TROPONINI in the last 168 hours. BNP: Invalid input(s): POCBNP CBG: No results for input(s): GLUCAP in the last 168 hours. D-Dimer No results for input(s): DDIMER in the last 72 hours. Hgb A1c No results for input(s): HGBA1C in the last 72 hours. Lipid Profile No results for input(s): CHOL, HDL, LDLCALC, TRIG, CHOLHDL, LDLDIRECT in the last 72 hours. Thyroid function studies No results for input(s): TSH, T4TOTAL, T3FREE, THYROIDAB in the last 72 hours.  Invalid input(s): FREET3 Anemia work up No results for input(s): VITAMINB12, FOLATE, FERRITIN, TIBC, IRON, RETICCTPCT in the last 72 hours. Urinalysis    Component Value Date/Time   COLORURINE YELLOW 12/01/2015 1759   APPEARANCEUR CLOUDY (A) 12/01/2015 1759   LABSPEC 1.010 12/01/2015 1759   PHURINE 5.5 12/01/2015 1759   GLUCOSEU NEGATIVE  12/01/2015 1759   GLUCOSEU 100 (A) 08/07/2015 0948   HGBUR SMALL (A) 12/01/2015 1759   BILIRUBINUR NEGATIVE 12/01/2015 1759   BILIRUBINUR neg 05/16/2015 1928   KETONESUR NEGATIVE 12/01/2015 1759   PROTEINUR NEGATIVE 12/01/2015 1759   UROBILINOGEN 0.2 08/07/2015 0948   NITRITE NEGATIVE 12/01/2015 1759   LEUKOCYTESUR MODERATE (A) 12/01/2015 1759     Microbiology No results found for this or any previous visit (from the past 240 hour(s)).     Inpatient Medications:   Scheduled Meds: . atorvastatin  80 mg Oral q1800  . escitalopram  10 mg Oral Daily  . insulin aspart protamine- aspart  15 Units Subcutaneous BID WC  . levETIRAcetam  500 mg Oral Daily  . metoprolol succinate  25 mg Oral Daily  . [START ON 01/03/2016] pantoprazole  40 mg Oral Q1200  . phenytoin  200 mg Oral BID  . piperacillin-tazobactam (ZOSYN)  IV  3.375 g Intravenous Q8H  . vancomycin  750 mg Intravenous Q12H   Continuous Infusions: . sodium chloride       Radiological Exams on Admission: No results found.  Impression/Recommendations Principal Problem:   Left hip postoperative wound infection -started empiric Iv Vanc and Zosyn -will need cultures from the OR tomorrow -pain control -per Orthopedics    Essential hypertension -stable, continue toprol, hold ACE at this time    Seizure disorder (HCC) -stable, continue Keppra and Dilantin    DM (diabetes mellitus) ([place]) -stable, resume Insulin 70/30 at lower dose and SSI    A-fib (HCC) -stable, in NSR now, continue TOprol, warfarin on hold for OR    CAD/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[place]) -last ECHO with EF of 35-40% -appears dry now, IVF for 24hours -hold lasix, resume coreg, statin -hold plavix for surgery and resume post op  Recent L leg DVT 12/14/15 -on warfarin, INR subtherpaeutic -will need Heparin or lovenox full dose after surgery tomorrow am -will give 2 doses of SQ heparin today  All labs pending at time of this consult, will follow up  Thank you  for this consultation.  Our [facility] hospitalist team will follow the patient with you.   Time Spent: 26min  Angelly Spearing M.D. [facility] 01/02/2016, 4:56 PM

## 2016-01-02 NOTE — Progress Notes (Signed)
Office Visit Note   Patient: [name] [name]           Date of Birth: 11/26/1940           MRN: [id number] Visit Date: 01/02/2016 Requested by: Colon Branch, MD [address] [place], [place] [address] PCP: [name], MD  Subjective: No chief complaint on file.   Patient returns today s/p left IT nail on 12/02/15, per Dr Lorin Mercy.  She is here today to have the left hip incision looked at, it is red and irritated.  She is nauseous today and does not know why.  She has not eaten today.  Per rehab note, doxycycline was d/c 5 days ago secondary to nausea.                  Review of Systems   Assessment & Plan: Visit Diagnoses: No diagnosis found.  Plan: Patient's a month out from left hip fracture fixation proximal incision is draining.  She's been on antibiotics but it's been throwing up and 16 often locks for several days cultures obtained of this drainage after prepping with alcohol and Betadine Benjiman Core is taking over the case and will be in contact Dr. Lorin Mercy about future management of this patient  Follow-Up Instructions: No Follow-up on file.   Orders:  No orders of the defined types were placed in this encounter.  No orders of the defined types were placed in this encounter.     Procedures: No procedures performed   Clinical Data: No additional findings.  Objective: Vital Signs: There were no vitals taken for this visit.  Physical Exam  Ortho Exam  Specialty Comments:  No specialty comments available.  Imaging: No results found.   PMFS History: Patient Active Problem List   Diagnosis Date Noted  . Closed intertrochanteric fracture of hip, left, sequela 12/25/2015  . Hip fracture ([place]) 12/01/2015  . Anemia 12/01/2015  . Leukocytosis 12/01/2015  . Ankle fracture, left 09/17/2015  . Ankle fracture, bimalleolar, closed   . Bilateral fibular fractures 09/14/2015  . Hypertensive heart disease 05/20/2015  . CAD (coronary artery disease)  05/20/2015  . Ischemic cardiomyopathy 05/20/2015  . Acute systolic CHF (congestive heart failure) ([place]) 05/20/2015  . PAF (paroxysmal atrial fibrillation) ([place]) 05/20/2015  . Anticoagulated on Coumadin 05/20/2015  . Type 2 diabetes mellitus with vascular disease ([place]) 05/17/2015  . NSTEMI (non-ST elevated myocardial infarction) ([place])   . Bacteremia due to Escherichia coli 05/11/2015  . Acute kidney injury superimposed on chronic kidney disease ([place]) 05/11/2015  . Uncontrolled hypertension 05/11/2015  . 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[place]) 79/89/2119  . Atrial fibrillation with RVR ([place]) 05/06/2015  . PCP NOTES >>>>>>>>>>>>>>>> 12/28/2014  . Depression 05/17/2014  . Memory loss 05/17/2014  . Diabetes mellitus-PCP comments 01/23/2014  . A-fib ([place]) 01/23/2014  . Anxiety and depression, PCP notes  11/24/2013  . Severe obesity (BMI >= 40) ([place]) 07/14/2013  . Encounter for therapeutic drug monitoring 06/02/2013  . Dilantin toxicity 11/21/2012  . History of encephalitis 11/18/2012  . Localization-related (focal) (partial) epilepsy and epileptic syndromes with complex partial seizures, without mention of intractable epilepsy 09/27/2012  . Hyponatremia 07/16/2012  . Intertrochanteric fracture of right hip ([place]) 07/13/2012  . DM (diabetes mellitus) ([place]) 01/19/2012  . GERD (gastroesophageal reflux disease) 10/05/2011  . Shoulder pain 08/27/2011  . Seizure disorder ([place]) 05/24/2011  . UTI (lower urinary tract infection) 05/23/2011  . UTI (urinary tract infection) 05/22/2011  . Annual physical exam 02/10/2011  .  Long term (current) use of anticoagulants 09/23/2010  . Atrial fibrillation ([place])   . 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 Hyperlipidemia 03/27/2006  . PERIPHERAL NEUROPATHY 03/27/2006  . 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 Essential hypertension 03/27/2006  . OSTEOPENIA 03/27/2006   Past Medical History:  Diagnosis Date  . Atrial fibrillation ([place])    SVT dx 2007, cath 2007 mild CAD, then had an  cardioversion, ablation; still on coumadin , has occ palpitation, EKG 03-2010 NSR  . Blindness of left eye   . DIABETES MELLITUS, TYPE II 03/27/2006   dr [name]  . Eye muscle weakness    Right eye weakness after cataract surgery  . GERD (gastroesophageal reflux disease) 10/05/2011  . 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 HYPERLIPIDEMIA 03/27/2006  . HYPERTENSION 03/27/2006  . Intertrochanteric fracture of right hip ([place]) 07/13/2012  . 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 Osteopenia 2004   Dexa 2004 showed Osteopenia, DEXA 03/2007 normal  . Osteopenia   . Other chronic cystitis with hematuria   . Recurrent urinary tract infection    Seeing Urology  . 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 Seizures ([place])   . 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[place]) 0/35/4656    Family History  Problem Relation Age of Onset  . Diabetes Father   . Heart attack Father 28  . Diabetes Sister   . Cancer Neg Hx     no hx of colon or breast cancer    Past Surgical History:  Procedure Laterality Date  . CARDIAC CATHETERIZATION N/A 05/17/2015   Procedure: Left Heart Cath and Coronary Angiography;  Surgeon: Jettie Booze, MD;  Location: [facility];  Service: Cardiovascular;  Laterality: N/A;  . CARDIAC CATHETERIZATION N/A 05/17/2015   Procedure: Coronary Balloon Angioplasty;  Surgeon: Jettie Booze, MD;  Location: [facility];  Service: Cardiovascular;  Laterality: N/A;  . FEMUR IM NAIL Right 07/15/2012   Procedure: INTRAMEDULLARY (IM) NAIL HIP;  Surgeon: Johnny Bridge, MD;  Location: [facility];  Service: Orthopedics;  Laterality: Right;  . FEMUR IM NAIL Left 12/02/2015   Procedure: INTRAMEDULLARY (IM) NAIL FEMORAL;  Surgeon: [name], MD;  Location: [facility];  Service: Orthopedics;  Laterality: Left;  . SHOULDER SURGERY     Social History   Occupational History  . retired  Retired   Social History Main Topics  . Smoking status: Former Smoker    Types: Cigarettes    Quit date: 03/02/1978  .  Smokeless tobacco: Never Used  . Alcohol use No  . Drug use: No  . Sexual activity: No

## 2016-01-02 NOTE — Progress Notes (Addendum)
Pt arrived alert and orientated x4 incont of urine, and in pain, changed and cleaned up pt put into bed, IV started and meal ordered, NPO sign put on door for after midnight, pt was pos. For MRSA 30 days ago, mrsa protocol in placed

## 2016-01-02 NOTE — Progress Notes (Signed)
Pharmacy Antibiotic Note  [name] [name] is a 75 y.o. female admitted on 01/02/2016 with hip redness and drainage.  Pharmacy has been consulted for vancomycin and zosyn dosing.  Patient with recent hip fracture and IM nail 12/02/2015.  Plan: Vancomycin 750 mg IV every 12 hours.  Goal trough 15-20 mcg/mL.  Zosyn 3.375g IV q 8 hrs. F/u cultures, renal function and clinical course. Will check vancomycin trough at steady state as indicated.    No data recorded.  No results for input(s): WBC, CREATININE, LATICACIDVEN, VANCOTROUGH, VANCOPEAK, VANCORANDOM, GENTTROUGH, GENTPEAK, GENTRANDOM, TOBRATROUGH, TOBRAPEAK, TOBRARND, AMIKACINPEAK, AMIKACINTROU, AMIKACIN in the last 168 hours.  Estimated Creatinine Clearance: 42.7 mL/min (by C-G formula based on SCr of 1.41 mg/dL (H)).    Allergies  Allergen Reactions  . Procaine Hcl Anaphylaxis    Antimicrobials this admission: Vancomycin 11/2 > Zosyn 11/2 >  Dose adjustments this admission:   Microbiology results: ** BCx: ** 11/2 UCx: * ** Sputum: *  ** MRSA PCR: *  Thank you for allowing pharmacy to be a part of this patient's care.  Uvaldo Rising, BCPS  Clinical Pharmacist Pager [phone]  01/02/2016 4:36 PM

## 2016-01-03 ENCOUNTER — Encounter (HOSPITAL_COMMUNITY)
Admission: RE | Disposition: A | Discharge status: Skilled Nursing Facility | Payer: Self-pay | Source: Ambulatory Visit | Attending: Orthopaedic Surgery

## 2016-01-03 ENCOUNTER — Inpatient Hospital Stay (HOSPITAL_COMMUNITY): Payer: Medicare Other | Admitting: Anesthesiology

## 2016-01-03 ENCOUNTER — Encounter (HOSPITAL_COMMUNITY): Payer: Self-pay | Admitting: Anesthesiology

## 2016-01-03 DIAGNOSIS — I480 Paroxysmal atrial fibrillation: Secondary | ICD-10-CM

## 2016-01-03 DIAGNOSIS — Z8249 Family history of ischemic heart disease and other diseases of the circulatory system: Secondary | ICD-10-CM

## 2016-01-03 DIAGNOSIS — E1159 Type 2 diabetes mellitus with other circulatory complications: Secondary | ICD-10-CM

## 2016-01-03 DIAGNOSIS — T8469XD Infection and inflammatory reaction due to internal fixation device of other site, subsequent encounter: Secondary | ICD-10-CM

## 2016-01-03 DIAGNOSIS — Z7901 Long term (current) use of anticoagulants: Secondary | ICD-10-CM

## 2016-01-03 DIAGNOSIS — B999 Unspecified infectious disease: Secondary | ICD-10-CM

## 2016-01-03 DIAGNOSIS — I5022 Chronic systolic (congestive) heart failure: Secondary | ICD-10-CM

## 2016-01-03 DIAGNOSIS — Z794 Long term (current) use of insulin: Secondary | ICD-10-CM

## 2016-01-03 DIAGNOSIS — E1122 Type 2 diabetes mellitus with diabetic chronic kidney disease: Secondary | ICD-10-CM

## 2016-01-03 DIAGNOSIS — T814XXA Infection following a procedure, initial encounter: Principal | ICD-10-CM

## 2016-01-03 DIAGNOSIS — I255 Ischemic cardiomyopathy: Secondary | ICD-10-CM

## 2016-01-03 DIAGNOSIS — I509 Heart failure, unspecified: Secondary | ICD-10-CM

## 2016-01-03 DIAGNOSIS — N183 Chronic kidney disease, stage 3 (moderate): Secondary | ICD-10-CM

## 2016-01-03 DIAGNOSIS — Z87891 Personal history of nicotine dependence: Secondary | ICD-10-CM

## 2016-01-03 DIAGNOSIS — Y792 Prosthetic and other implants, materials and accessory orthopedic devices associated with adverse incidents: Secondary | ICD-10-CM

## 2016-01-03 DIAGNOSIS — B9689 Other specified bacterial agents as the cause of diseases classified elsewhere: Secondary | ICD-10-CM

## 2016-01-03 DIAGNOSIS — I10 Essential (primary) hypertension: Secondary | ICD-10-CM

## 2016-01-03 DIAGNOSIS — Z833 Family history of diabetes mellitus: Secondary | ICD-10-CM

## 2016-01-03 DIAGNOSIS — G40909 Epilepsy, unspecified, not intractable, without status epilepticus: Secondary | ICD-10-CM

## 2016-01-03 DIAGNOSIS — Z884 Allergy status to anesthetic agent status: Secondary | ICD-10-CM

## 2016-01-03 HISTORY — PX: INCISION AND DRAINAGE HIP: SHX1801

## 2016-01-03 LAB — BASIC METABOLIC PANEL
ANION GAP: 12 (ref 5–15)
BUN: 40 mg/dL — ABNORMAL HIGH (ref 6–20)
CO2: 23 mmol/L (ref 22–32)
Calcium: 8.2 mg/dL — ABNORMAL LOW (ref 8.9–10.3)
Chloride: 95 mmol/L — ABNORMAL LOW (ref 101–111)
Creatinine, Ser: 1.87 mg/dL — ABNORMAL HIGH (ref 0.44–1.00)
GFR, EST AFRICAN AMERICAN: 29 mL/min — AB (ref >60)
GFR, EST NON AFRICAN AMERICAN: 25 mL/min — AB (ref >60)
Glucose, Bld: 170 mg/dL — ABNORMAL HIGH (ref 65–99)
POTASSIUM: 2.9 mmol/L — AB (ref 3.5–5.1)
SODIUM: 130 mmol/L — AB (ref 135–145)

## 2016-01-03 LAB — URINALYSIS, ROUTINE W REFLEX MICROSCOPIC
Bilirubin Urine: NEGATIVE
Glucose, UA: NEGATIVE mg/dL
Ketones, ur: NEGATIVE mg/dL
Nitrite: NEGATIVE
Protein, ur: NEGATIVE mg/dL
Specific Gravity, Urine: 1.01 (ref 1.005–1.030)
pH: 6 (ref 5.0–8.0)

## 2016-01-03 LAB — URINE MICROSCOPIC-ADD ON

## 2016-01-03 LAB — RETICULOCYTES
RBC.: 2.57 MIL/uL — ABNORMAL LOW (ref 3.87–5.11)
RETIC COUNT ABSOLUTE: 28.3 10*3/uL (ref 19.0–186.0)
Retic Ct Pct: 1.1 % (ref 0.4–3.1)

## 2016-01-03 LAB — GLUCOSE, CAPILLARY
GLUCOSE-CAPILLARY: 225 mg/dL — AB (ref 65–99)
GLUCOSE-CAPILLARY: 196 mg/dL — AB (ref 65–99)
GLUCOSE-CAPILLARY: 174 mg/dL — AB (ref 65–99)
Glucose-Capillary: 167 mg/dL — ABNORMAL HIGH (ref 65–99)

## 2016-01-03 LAB — CBC
HCT: 24.9 % — ABNORMAL LOW (ref 36.0–46.0)
Hemoglobin: 8.1 g/dL — ABNORMAL LOW (ref 12.0–15.0)
MCH: 29.6 pg (ref 26.0–34.0)
MCHC: 32.5 g/dL (ref 30.0–36.0)
MCV: 90.9 fL (ref 78.0–100.0)
PLATELETS: 302 10*3/uL (ref 150–400)
RBC: 2.74 MIL/uL — AB (ref 3.87–5.11)
RDW: 14.1 % (ref 11.5–15.5)
WBC: 16.3 10*3/uL — AB (ref 4.0–10.5)

## 2016-01-03 LAB — VITAMIN B12: Vitamin B-12: 741 pg/mL (ref 180–914)

## 2016-01-03 LAB — IRON AND TIBC
IRON: 9 ug/dL — AB (ref 28–170)
SATURATION RATIOS: 7 % — AB (ref 10.4–31.8)
TIBC: 132 ug/dL — ABNORMAL LOW (ref 250–450)
UIBC: 123 ug/dL

## 2016-01-03 LAB — PROTIME-INR
INR: 3.88
PROTHROMBIN TIME: 39 s — AB (ref 11.4–15.2)

## 2016-01-03 LAB — SURGICAL PCR SCREEN
MRSA, PCR: NEGATIVE
Staphylococcus aureus: NEGATIVE

## 2016-01-03 LAB — MAGNESIUM: Magnesium: 1.6 mg/dL — ABNORMAL LOW (ref 1.7–2.4)

## 2016-01-03 LAB — FOLATE: Folate: 22.4 ng/mL (ref >5.9)

## 2016-01-03 LAB — PREPARE RBC (CROSSMATCH)

## 2016-01-03 LAB — FERRITIN: FERRITIN: 309 ng/mL — AB (ref 11–307)

## 2016-01-03 SURGERY — IRRIGATION AND DEBRIDEMENT HIP
Anesthesia: General | Laterality: Left

## 2016-01-03 MED ORDER — ONDANSETRON HCL 4 MG/2ML IJ SOLN
4.0000 mg | Freq: Four times a day (QID) | INTRAMUSCULAR | Status: DC | PRN
  Filled 2016-01-03: qty 2

## 2016-01-03 MED ORDER — SODIUM CHLORIDE 0.9 % IV SOLN
INTRAVENOUS | Status: DC
  Administered 2016-01-03: 11:00:00 via INTRAVENOUS

## 2016-01-03 MED ORDER — VANCOMYCIN HCL 1000 MG IV SOLR
INTRAVENOUS | Status: DC | PRN
  Administered 2016-01-03: 1000 mg

## 2016-01-03 MED ORDER — OXYCODONE HCL 5 MG PO TABS: 5.0000 mg | ORAL_TABLET | ORAL | Status: DC | PRN

## 2016-01-03 MED ORDER — ACETAMINOPHEN 325 MG PO TABS: 650.0000 mg | ORAL_TABLET | Freq: Four times a day (QID) | ORAL | Status: DC | PRN

## 2016-01-03 MED ORDER — PROPOFOL 10 MG/ML IV BOLUS
INTRAVENOUS | Status: DC | PRN
  Administered 2016-01-03: 30 mg via INTRAVENOUS
  Administered 2016-01-03: 100 mg via INTRAVENOUS
  Administered 2016-01-03: 20 mg via INTRAVENOUS

## 2016-01-03 MED ORDER — ONDANSETRON HCL 4 MG PO TABS: 4.0000 mg | ORAL_TABLET | Freq: Four times a day (QID) | ORAL | Status: DC | PRN

## 2016-01-03 MED ORDER — FENTANYL CITRATE (PF) 100 MCG/2ML IJ SOLN
INTRAMUSCULAR | Status: DC | PRN
  Administered 2016-01-03 (×3): 50 ug via INTRAVENOUS

## 2016-01-03 MED ORDER — PHENYLEPHRINE 40 MCG/ML (10ML) SYRINGE FOR IV PUSH (FOR BLOOD PRESSURE SUPPORT)
PREFILLED_SYRINGE | INTRAVENOUS | Status: AC
  Filled 2016-01-03: qty 10

## 2016-01-03 MED ORDER — FENTANYL CITRATE (PF) 100 MCG/2ML IJ SOLN
INTRAMUSCULAR | Status: AC
  Filled 2016-01-03: qty 2

## 2016-01-03 MED ORDER — ROCURONIUM BROMIDE 10 MG/ML (PF) SYRINGE
PREFILLED_SYRINGE | INTRAVENOUS | Status: AC
  Filled 2016-01-03: qty 10

## 2016-01-03 MED ORDER — POTASSIUM CHLORIDE CRYS ER 20 MEQ PO TBCR
40.0000 meq | EXTENDED_RELEASE_TABLET | ORAL | Status: AC
  Administered 2016-01-03: 40 meq via ORAL
  Filled 2016-01-03: qty 2

## 2016-01-03 MED ORDER — POTASSIUM CHLORIDE IN NACL 20-0.9 MEQ/L-% IV SOLN
INTRAVENOUS | Status: DC
  Administered 2016-01-03 – 2016-01-06 (×5): via INTRAVENOUS
  Filled 2016-01-03 (×5): qty 1000

## 2016-01-03 MED ORDER — GENTAMICIN SULFATE 40 MG/ML IJ SOLN
INTRAMUSCULAR | Status: DC | PRN
  Administered 2016-01-03: 160 mg via INTRAMUSCULAR

## 2016-01-03 MED ORDER — WARFARIN - PHARMACIST DOSING INPATIENT: Freq: Every day | Status: DC

## 2016-01-03 MED ORDER — ONDANSETRON HCL 4 MG/2ML IJ SOLN
INTRAMUSCULAR | Status: AC
  Filled 2016-01-03: qty 2

## 2016-01-03 MED ORDER — VANCOMYCIN HCL IN DEXTROSE 1-5 GM/200ML-% IV SOLN
INTRAVENOUS | Status: AC
  Filled 2016-01-03: qty 200

## 2016-01-03 MED ORDER — SODIUM CHLORIDE 0.9 % IR SOLN
Status: DC | PRN
  Administered 2016-01-03: 3000 mL

## 2016-01-03 MED ORDER — FENTANYL CITRATE (PF) 100 MCG/2ML IJ SOLN: 25.0000 ug | INTRAMUSCULAR | Status: DC | PRN

## 2016-01-03 MED ORDER — ACETAMINOPHEN 650 MG RE SUPP: 650.0000 mg | Freq: Four times a day (QID) | RECTAL | Status: DC | PRN

## 2016-01-03 MED ORDER — MAGNESIUM SULFATE 4 GM/100ML IV SOLN
4.0000 g | Freq: Once | INTRAVENOUS | Status: AC
  Administered 2016-01-03: 4 g via INTRAVENOUS
  Filled 2016-01-03 (×2): qty 100

## 2016-01-03 MED ORDER — EPHEDRINE 5 MG/ML INJ
INTRAVENOUS | Status: AC
  Filled 2016-01-03: qty 10

## 2016-01-03 MED ORDER — LIDOCAINE 2% (20 MG/ML) 5 ML SYRINGE
INTRAMUSCULAR | Status: AC
  Filled 2016-01-03: qty 10

## 2016-01-03 MED ORDER — EPHEDRINE SULFATE 50 MG/ML IJ SOLN
INTRAMUSCULAR | Status: DC | PRN
  Administered 2016-01-03: 10 mg via INTRAVENOUS
  Administered 2016-01-03: 5 mg via INTRAVENOUS
  Administered 2016-01-03: 10 mg via INTRAVENOUS

## 2016-01-03 MED ORDER — METOCLOPRAMIDE HCL 5 MG/ML IJ SOLN: 5.0000 mg | Freq: Three times a day (TID) | INTRAMUSCULAR | Status: DC | PRN

## 2016-01-03 MED ORDER — LIDOCAINE HCL (CARDIAC) 20 MG/ML IV SOLN
INTRAVENOUS | Status: DC | PRN
  Administered 2016-01-03: 60 mg via INTRAVENOUS

## 2016-01-03 MED ORDER — METOCLOPRAMIDE HCL 5 MG PO TABS: 5.0000 mg | ORAL_TABLET | Freq: Three times a day (TID) | ORAL | Status: DC | PRN

## 2016-01-03 MED ORDER — DEXTROSE 5 % IV SOLN
2.0000 g | INTRAVENOUS | Status: DC
  Administered 2016-01-03 – 2016-01-04 (×2): 2 g via INTRAVENOUS
  Filled 2016-01-03 (×4): qty 2

## 2016-01-03 MED ORDER — HYDROMORPHONE HCL 2 MG/ML IJ SOLN: 0.5000 mg | INTRAMUSCULAR | Status: DC | PRN

## 2016-01-03 MED ORDER — DOCUSATE SODIUM 100 MG PO CAPS
100.0000 mg | ORAL_CAPSULE | Freq: Two times a day (BID) | ORAL | Status: DC
  Administered 2016-01-03 – 2016-01-06 (×5): 100 mg via ORAL
  Filled 2016-01-03 (×5): qty 1

## 2016-01-03 MED ORDER — GENTAMICIN SULFATE 40 MG/ML IJ SOLN
INTRAMUSCULAR | Status: AC
  Filled 2016-01-03: qty 4

## 2016-01-03 MED ORDER — SUCCINYLCHOLINE CHLORIDE 200 MG/10ML IV SOSY
PREFILLED_SYRINGE | INTRAVENOUS | Status: AC
  Filled 2016-01-03: qty 10

## 2016-01-03 MED ORDER — SODIUM CHLORIDE 0.9 % IV SOLN: 10.0000 mL/h | Freq: Once | INTRAVENOUS | Status: DC

## 2016-01-03 MED ORDER — VANCOMYCIN HCL 1000 MG IV SOLR
INTRAVENOUS | Status: AC
  Filled 2016-01-03: qty 1000

## 2016-01-03 MED ORDER — SODIUM CHLORIDE 0.9 % IV SOLN
510.0000 mg | Freq: Once | INTRAVENOUS | Status: AC
  Administered 2016-01-03: 510 mg via INTRAVENOUS
  Filled 2016-01-03: qty 17

## 2016-01-03 MED ORDER — ONDANSETRON HCL 4 MG/2ML IJ SOLN
INTRAMUSCULAR | Status: DC | PRN
  Administered 2016-01-03: 4 mg via INTRAVENOUS

## 2016-01-03 SURGICAL SUPPLY — 46 items
BOWL SMART MIX CTS (DISPOSABLE) IMPLANT
COVER SURGICAL LIGHT HANDLE (MISCELLANEOUS) ×3 IMPLANT
DRAIN CHANNEL 10F 3/8 F FF (DRAIN) ×2 IMPLANT
DRAPE IMP U-DRAPE 54X76 (DRAPES) ×3 IMPLANT
DRAPE ORTHO SPLIT 77X108 STRL (DRAPES) ×6
DRAPE SURG ORHT 6 SPLT 77X108 (DRAPES) ×2 IMPLANT
DRAPE U-SHAPE 47X51 STRL (DRAPES) ×3 IMPLANT
DRSG PAD ABDOMINAL 8X10 ST (GAUZE/BANDAGES/DRESSINGS) ×5 IMPLANT
DURAPREP 26ML APPLICATOR (WOUND CARE) ×3 IMPLANT
ELECT CAUTERY BLADE 6.4 (BLADE) IMPLANT
ELECT REM PT RETURN 9FT ADLT (ELECTROSURGICAL)
ELECTRODE REM PT RTRN 9FT ADLT (ELECTROSURGICAL) IMPLANT
GAUZE SPONGE 4X4 12PLY STRL (GAUZE/BANDAGES/DRESSINGS) ×5 IMPLANT
GAUZE XEROFORM 5X9 LF (GAUZE/BANDAGES/DRESSINGS) ×3 IMPLANT
GLOVE BIOGEL PI IND STRL 8 (GLOVE) ×2 IMPLANT
GLOVE BIOGEL PI INDICATOR 8 (GLOVE) ×4
GLOVE ORTHO TXT STRL SZ7.5 (GLOVE) ×6 IMPLANT
GOWN STRL REUS W/ TWL LRG LVL3 (GOWN DISPOSABLE) ×1 IMPLANT
GOWN STRL REUS W/ TWL XL LVL3 (GOWN DISPOSABLE) ×1 IMPLANT
GOWN STRL REUS W/TWL 2XL LVL3 (GOWN DISPOSABLE) ×3 IMPLANT
GOWN STRL REUS W/TWL LRG LVL3 (GOWN DISPOSABLE) ×3
GOWN STRL REUS W/TWL XL LVL3 (GOWN DISPOSABLE) ×3
HANDPIECE INTERPULSE COAX TIP (DISPOSABLE)
KIT BASIN OR (CUSTOM PROCEDURE TRAY) ×3 IMPLANT
KIT ROOM TURNOVER OR (KITS) ×3 IMPLANT
KIT STIMULAN RAPID CURE 5CC (Orthopedic Implant) ×2 IMPLANT
MANIFOLD NEPTUNE II (INSTRUMENTS) ×3 IMPLANT
NS IRRIG 1000ML POUR BTL (IV SOLUTION) ×3 IMPLANT
PACK TOTAL JOINT (CUSTOM PROCEDURE TRAY) ×3 IMPLANT
PACK UNIVERSAL I (CUSTOM PROCEDURE TRAY) ×3 IMPLANT
PAD ARMBOARD 7.5X6 YLW CONV (MISCELLANEOUS) ×6 IMPLANT
SET HNDPC FAN SPRY TIP SCT (DISPOSABLE) IMPLANT
SPONGE LAP 18X18 X RAY DECT (DISPOSABLE) ×3 IMPLANT
STAPLER VISISTAT 35W (STAPLE) ×3 IMPLANT
SUT ETHILON 2 LR (SUTURE) ×4 IMPLANT
SUT PDS AB 1 CT  36 (SUTURE)
SUT PDS AB 1 CT 36 (SUTURE) IMPLANT
SUT VIC AB 0 CT1 27 (SUTURE) ×6
SUT VIC AB 0 CT1 27XBRD ANBCTR (SUTURE) ×2 IMPLANT
SUT VIC AB 2-0 CT1 27 (SUTURE) ×6
SUT VIC AB 2-0 CT1 TAPERPNT 27 (SUTURE) ×2 IMPLANT
TOWEL OR 17X24 6PK STRL BLUE (TOWEL DISPOSABLE) ×3 IMPLANT
TOWEL OR 17X26 10 PK STRL BLUE (TOWEL DISPOSABLE) ×3 IMPLANT
TUBE ANAEROBIC SPECIMEN COL (MISCELLANEOUS) IMPLANT
UNDERPAD 30X30 (UNDERPADS AND DIAPERS) ×3 IMPLANT
WATER STERILE IRR 1000ML POUR (IV SOLUTION) ×3 IMPLANT

## 2016-01-03 NOTE — Progress Notes (Signed)
PROGRESS NOTE/Consult    [place]  XIP:[id number] DOB: 05-29-1940 DOA: 01/02/2016 PCP: [name], MD    Brief Narrative:  75 year old female history of insulin-dependent diabetes, paroxysmal atrial fibrillation on chronic anticoagulation, history of chronic systolic heart failure, seizure disorder, GERD, recent diagnosis of left lower extremity DVT, status post left hip IM nail for hip fracture 12/01/2005 presented with increased pain, swelling and discharge at operative site and admitted for postop infection. [facility] hospitalist was consulted for management of chronic medical issues.   Assessment & Plan:   Principal Problem:   Left hip postoperative wound infection Active Problems:   Hereditary and idiopathic peripheral neuropathy   Essential hypertension   Seizure disorder (HCC)   DM (diabetes mellitus) ([place])   A-fib (HCC)   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[place])   Type 2 diabetes mellitus with vascular disease ([place])   CAD (coronary artery disease)   Ischemic cardiomyopathy   #1 left hip postoperative wound infection Status post sharp excisional debridement of left hip postop subcutaneous infected hematoma per orthopedics. Blood cultures have been obtained and are pending. Wound cultures pending. Was on IV vancomycin and IV Zosyn. ID have consulted on the patient and IV Zosyn has been changed to IV Rocephin. Per orthopedics and per ID.  #2 atrial fibrillation A normal sinus rhythm. Continue Toprol-XL for rate control. INR at 3.88. Coumadin on hold.  #3 hypokalemia/hypomagnesemia Replete. Keep magnesium greater than 2.  #4 seizure disorder Stable. Continue Keppra and dilantin  #5 systolic heart failure/CAD Stable. Currently compensated. Continue Lipitor. Diuretics and ACE inhibitor on hold. Continue gentle hydration.  #6 recent left lower extremity DVT 12/14/15 INR is supratherapeutic. Coumadin per pharmacy.  #7 diabetes mellitus Hemoglobin A1c was 7.3 on 12/02/2015. CBGs have  ranged from 167-225. Continue current dose of 70/30 and sliding scale insulin. Once tolerating oral intake a more alert we'll uptitrate 70/30.   DVT prophylaxis: Per primary team Code Status: Full Family Communication: Updated patient. No family present. Disposition Plan: SNF per primary team.   Consultants:   Hospitalists: Dr Broadus [name]  ID: Dr. Linus Salmons 01/03/2016  Procedures:   Chest x-ray 01/02/2016  Antimicrobials:   IV Rocephin 01/03/2016  IV Zosyn 01/02/2016>>>> 01/03/2016  IV vancomycin 01/02/2016   Subjective: Patient is postop. Drowsy. Opens eyes to verbal stimuli. Denies chest pain or shortness of breath.  Objective: Vitals:   01/03/16 1205 01/03/16 1215 01/03/16 1230 01/03/16 1300  BP: (!) 129/44 (!) 133/41 135/88 (!) 119/38  Pulse: 78   69  Resp: 12   16  Temp: 97.6 F (36.4 C)  98 F (36.7 C) 99 F (37.2 C)  TempSrc:    Oral  SpO2: 98%   100%    Intake/Output Summary (Last 24 hours) at 01/03/16 1450 Last data filed at 01/03/16 1210  Gross per 24 hour  Intake             1710 ml  Output               15 ml  Net             1695 ml   There were no vitals filed for this visit.  Examination:  General exam: Drowsy. Postop. Respiratory system: Clear to auscultation anterior lung fields. Respiratory effort normal. Cardiovascular system: S1 & S2 heard, RRR. No JVD, murmurs, rubs, gallops or clicks. No pedal edema. Gastrointestinal system: Abdomen is nondistended, soft and nontender. No organomegaly or masses felt. Normal bowel sounds heard. Central nervous system: Alert and oriented.  No focal neurological deficits. Extremities: Symmetric 5 x 5 power. Skin: No rashes, lesions or ulcers Psychiatry: Unable to assess as patient is postop and drowsy.    Data Reviewed: I have personally reviewed following labs and imaging studies  CBC:  Recent Labs Lab 01/02/16 1705 01/03/16 0215  WBC 18.9* 16.3*  NEUTROABS 15.5*  --   HGB 9.3* 8.1*  HCT 28.2*  24.9*  MCV 91.3 90.9  PLT 316 401   Basic Metabolic Panel:  Recent Labs Lab 01/02/16 1705 01/03/16 0215  NA 132* 130*  K 3.6 2.9*  CL 96* 95*  CO2 24 23  GLUCOSE 174* 170*  BUN 40* 40*  CREATININE 1.94* 1.87*  CALCIUM 9.0 8.2*   GFR: Estimated Creatinine Clearance: 32.2 mL/min (by C-G formula based on SCr of 1.87 mg/dL (H)). Liver Function Tests:  Recent Labs Lab 01/02/16 1705  AST 22  ALT 11*  ALKPHOS 181*  BILITOT 0.7  PROT 6.5  ALBUMIN 2.4*   No results for input(s): LIPASE, AMYLASE in the last 168 hours. No results for input(s): AMMONIA in the last 168 hours. Coagulation Profile:  Recent Labs Lab 01/02/16 1705 01/03/16 0215  INR 3.24 3.88   Cardiac Enzymes: No results for input(s): CKTOTAL, CKMB, CKMBINDEX, TROPONINI in the last 168 hours. BNP (last 3 results) No results for input(s): PROBNP in the last 8760 hours. HbA1C: No results for input(s): HGBA1C in the last 72 hours. CBG:  Recent Labs Lab 01/02/16 1703 01/02/16 2217 01/03/16 0703 01/03/16 1015 01/03/16 1210  GLUCAP 162* 207* 225* 196* 174*   Lipid Profile: No results for input(s): CHOL, HDL, LDLCALC, TRIG, CHOLHDL, LDLDIRECT in the last 72 hours. Thyroid Function Tests: No results for input(s): TSH, T4TOTAL, FREET4, T3FREE, THYROIDAB in the last 72 hours. Anemia Panel: No results for input(s): VITAMINB12, FOLATE, FERRITIN, TIBC, IRON, RETICCTPCT in the last 72 hours. Sepsis Labs: No results for input(s): PROCALCITON, LATICACIDVEN in the last 168 hours.  Recent Results (from the past 240 hour(s))  Surgical PCR screen     Status: None   Collection Time: 01/02/16 10:00 PM  Result Value Ref Range Status   MRSA, PCR NEGATIVE NEGATIVE Final   Staphylococcus aureus NEGATIVE NEGATIVE Final    Comment:        The Xpert SA Assay (FDA approved for NASAL specimens in patients over 11 years of age), is one component of a comprehensive surveillance program.  Test performance has been  validated by [facility] for patients greater than or equal to 74 year old. It is not intended to diagnose infection nor to guide or monitor treatment.          Radiology Studies: Portable Chest 1 View  Result Date: 01/02/2016 CLINICAL DATA:  Preoperative evaluation. History of atrial fibrillation EXAM: PORTABLE CHEST 1 VIEW COMPARISON:  December 01, 2015 FINDINGS: There is no edema or consolidation. Heart size and pulmonary vascularity are normal. There is atherosclerotic calcification in the aorta. Surgical clips overlie the right inferior paratracheal region, stable. No evident adenopathy. There is evidence of an old healed fracture of the lateral left eighth rib. IMPRESSION: No edema or consolidation.  Aortic atherosclerosis. Electronically Signed   By: Lowella Grip III M.D.   On: 01/02/2016 17:02        Scheduled Meds: . sodium chloride  10 mL/hr Intravenous Once  . atorvastatin  80 mg Oral q1800  . docusate sodium  100 mg Oral BID  . escitalopram  10 mg Oral Daily  . insulin  aspart  0-9 Units Subcutaneous TID WC  . insulin aspart protamine- aspart  15 Units Subcutaneous BID WC  . levETIRAcetam  500 mg Oral Daily  . metoprolol succinate  25 mg Oral Daily  . pantoprazole  40 mg Oral Q1200  . phenytoin  200 mg Oral BID  . piperacillin-tazobactam (ZOSYN)  IV  3.375 g Intravenous Q8H  . potassium chloride  40 mEq Oral Q4H  . vancomycin  750 mg Intravenous Q12H  . Warfarin - Pharmacist Dosing Inpatient   Does not apply q1800   Continuous Infusions: . sodium chloride 75 mL/hr at 01/02/16 1855  . sodium chloride 10 mL/hr at 01/03/16 1042  . 0.9 % NaCl with KCl 20 mEq / L       LOS: 1 day    Time spent: 87 minutes    Timmy Cleverly, MD [facility] Pager [phone]  If 7PM-7AM, please contact night-coverage [email or web address] Password [email or web address] 01/03/2016, 2:50 PM

## 2016-01-03 NOTE — Progress Notes (Signed)
Pharmacy Consult Note IV Iron Replacement   64 yof admitted with post-op infection, antibiotics de-escalated today. Hg drop to 8.1, Fe low 9, Tsat low 7. Noted CKD. Pharmacy consulted to dose IV iron replacement.   Plan: Feraheme 510mg  IV x 1 May repeat dose in 3 to 8 days if needed for total of 1020mg  Monitor Hg, repeat Fe panel as appropriate   Elicia Lamp, PharmD, BCPS Clinical Pharmacist 01/03/2016 8:13 PM

## 2016-01-03 NOTE — Progress Notes (Signed)
Spoke with infectious disease physician Dr Linus Salmons and he will do consult.  Greatly appreciate it.

## 2016-01-03 NOTE — Op Note (Signed)
Preop diagnosis: Left trochanteric postop infected subcutaneous hematoma  Postop diagnosis: Same  Procedure: Sharp excisional debridement of left hip postoperative subcutaneous infected hematoma.  Surgeon: Rodell Perna M.D. Asst. Benjiman Core PA-C present for the entire procedure  Anesthesia Gen.  Brief history :this 75 year old female has had previous right hip fracture and fell and had the drug nailing for a left intertrochanteric fracture October 2017. She's had history of recurrent Escherichia coli UTIs with admission for bacteremia. She's been treated by urology for chronic cystitis with hematuria. Positive PCR with history of MRSA infection. At the time of her hip surgery 10-17 she received vancomycin due to her positive MRSA history. Postop she had problems with the UTI while in the skilled facility and had been on doxycycline and started having problems with nausea and vomiting and the skilled facility stopped her antibiotic. Incision looked good at 2 week office follow-up. At that time she was taking doxycycline for treatment of a recurrent UTI and then after it was stopped she recovers in to the office yesterday with erythematous incision localized cellulitis fever increased white count sedimentation rate of about 100 and had some purulent drainage from the wound. She was admitted to the hospital started on Vanco and Zosyn and today after being nothing by mouth she's taken the operating room for washout of the subcutaneous hematoma. Patient had a short trochanteric and the interlocking mid thigh incision looked good with no cellulitis. X-ray showed good position and alignment postop. Patient had been on Coumadin after she did return to the emergency room for treatment of recurrent urinary tract infection and workup at that time revealed that she had some femoral DVT despite treatment with aspirin which was selected due to history of multiple falls. There is no evidence of PE and she was started on  Coumadin and the Coumadin was held at the time of her admission. Hospitalist service saw her in consultation and gave her 2 doses of subcutaneous Lovenox. A culture of the drainage was taken while she was in the office before she got admitted antibiotics started. INR this morning was 3.24. Urinalysis on admission looked like she had recurrent UTI again. Urine cultures are pending.   Operative procedure: after induction general anesthesia patient placed in a lazy lateral position. Standard prepping with DuraPrep was performed patient was started on vancomycin and Zosyn so no additional antibiotics were necessary and she was on schedule for these. Area squared with the split sheets drapes timeout procedure completed. Tonsil clamp was poked in incision immediately infected hematoma was drained culture was obtained even though she is on antibiotics. Wound was then opened. There is a pocket that went down to the gluteus medius fascia but did not extend down to the bone. A curette as well as Ronjair scalpel and pickups were used for sharp excisional debridement of subtendinous tissue. Her crit curette was used to scrape the subtendinous fat tissue to remove all nonviable tissue. Initially 1 L saline was used in pulse lavage was opened and 3 L of pulse lavage was used for cleaning out. There is no low pulsatile bleeding area present and anabiotic beads with the bank and gent were mixed using the small beads observable and placed inside the wound and then interrupted #2 nylon simple and far near near far sutures were used after Hemovac drain was placed. With the large sutures were able help close the dead space. Palpation prior to closure showed it did not extend down the bone this is superficial infection. Dressing  was applied to packs 4 x 4's to ABDs and tape. Patient tolerated the procedure well. Signed Rodell Perna M.D.

## 2016-01-03 NOTE — Anesthesia Postprocedure Evaluation (Signed)
Anesthesia Post Note  Patient: [name]  Procedure(s) Performed: Procedure(s) (LRB): IRRIGATION AND DEBRIDEMENT HIP (Left)  Patient location during evaluation: PACU Anesthesia Type: General Level of consciousness: awake and alert Pain management: pain level controlled Vital Signs Assessment: post-procedure vital signs reviewed and stable Respiratory status: spontaneous breathing, nonlabored ventilation, respiratory function stable and patient connected to nasal cannula oxygen Cardiovascular status: blood pressure returned to baseline and stable Postop Assessment: no signs of nausea or vomiting Anesthetic complications: no    Last Vitals:  Vitals:   01/03/16 1230 01/03/16 1300  BP: 135/88 (!) 119/38  Pulse:  69  Resp:  16  Temp: 36.7 C 37.2 C    Last Pain:  Vitals:   01/03/16 1300  TempSrc: Oral  PainSc:                  Crystle Carelli,JAMES TERRILL

## 2016-01-03 NOTE — Progress Notes (Signed)
INR elevated. For surgery this AM for I and D of left hip.  Plan discussed with pt and she agrees to proceed.

## 2016-01-03 NOTE — Progress Notes (Signed)
Inpatient Diabetes Program Recommendations  AACE/ADA: New Consensus Statement on Inpatient Glycemic Control (2015)  Target Ranges:  Prepandial:   less than 140 mg/dL      Peak postprandial:   less than 180 mg/dL (1-2 hours)      Critically ill patients:  140 - 180 mg/dL   Lab Results  Component Value Date   GLUCAP 225 (H) 01/03/2016   HGBA1C 7.3 (H) 12/02/2015    Review of Glycemic Control:  Results for MONSERAT, PRESTIGIACOMO (MRN [id number]) as of 01/03/2016 09:40  Ref. Range 01/02/2016 17:03 01/02/2016 22:17 01/03/2016 07:03  Glucose-Capillary Latest Ref Range: 65 - 99 mg/dL 162 (H) 207 (H) 225 (H)    Diabetes history: Type 2 diabetes Outpatient Diabetes medications: Novolin 70/30 30 units in the AM and 14 units with lunch and dinner Current orders for Inpatient glycemic control:  Novolog sensitive tid with meals, Novolog 70/30 15 units bid  Inpatient Diabetes Program Recommendations:    Called and spoke with RN.  Note that patient is NPO for surgery.  Explained that 70/30 is to be given with a meal therefore it should be held this morning since patient is NPO.  However Novolog correction should be given even if NPO.  RN appreciative of call.  Will follow.  Thanks, Adah Perl, RN, BC-ADM Inpatient Diabetes Coordinator Pager [phone] (8a-5p)

## 2016-01-03 NOTE — Interval H&P Note (Signed)
History and Physical Interval Note:  01/03/2016 10:32 AM  [name] [name]  has presented today for surgery, with the diagnosis of infected left hip  The various methods of treatment have been discussed with the patient and family. After consideration of risks, benefits and other options for treatment, the patient has consented to  Procedure(s): IRRIGATION AND DEBRIDEMENT HIP (Left) as a surgical intervention .  The patient's history has been reviewed, patient examined, no change in status, stable for surgery.  I have reviewed the patient's chart and labs.  Questions were answered to the patient's satisfaction.     [name]

## 2016-01-03 NOTE — Progress Notes (Signed)
ANTICOAGULATION CONSULT NOTE - Initial Consult  Pharmacy Consult for Coumadin Indication: atrial fibrillation  Allergies  Allergen Reactions  . Procaine Hcl Anaphylaxis    Vital Signs: Temp: 98 F (36.7 C) (11/03 1230) Temp Source: Oral (11/03 0700) BP: 135/88 (11/03 1230) Pulse Rate: 78 (11/03 1205)  Assessment: 75 yo F with hx PAF, recent acute DVT on chronic Coumadin 5mg  daily exc for 7.5mg  on Tues/Fri. On hold since admit for I&D of hip. Now to restart on 11/3. Needs bridging after if INR falls below 2. INR today trending up to 3.88. Hgb down to 8.1, plts wnl. No s/s of bleed.  Goal of Therapy:  INR 2-3 Monitor platelets by anticoagulation protocol: Yes   Plan:  Hold Coumadin tonight Monitor daily INR, CBC, s/s of bleed  Elenor Quinones, PharmD, BCPS Clinical Pharmacist Pager [phone] 01/03/2016 1:30 PM

## 2016-01-03 NOTE — Consult Note (Signed)
[place] for Infectious Disease       Reason for Consult: wound infection    Referring Physician: Dr. Lorin Mercy  Principal Problem:   Left hip postoperative wound infection Active Problems:   Hereditary and idiopathic peripheral neuropathy   Essential hypertension   Seizure disorder (HCC)   DM (diabetes mellitus) ([place])   A-fib ([place])   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[place])   Type 2 diabetes mellitus with vascular disease ([place])   CAD (coronary artery disease)   Ischemic cardiomyopathy   . sodium chloride  10 mL/hr Intravenous Once  . atorvastatin  80 mg Oral q1800  . docusate sodium  100 mg Oral BID  . escitalopram  10 mg Oral Daily  . insulin aspart  0-9 Units Subcutaneous TID WC  . insulin aspart protamine- aspart  15 Units Subcutaneous BID WC  . levETIRAcetam  500 mg Oral Daily  . metoprolol succinate  25 mg Oral Daily  . pantoprazole  40 mg Oral Q1200  . phenytoin  200 mg Oral BID  . piperacillin-tazobactam (ZOSYN)  IV  3.375 g Intravenous Q8H  . potassium chloride  40 mEq Oral Q4H  . vancomycin  750 mg Intravenous Q12H  . Warfarin - Pharmacist Dosing Inpatient   Does not apply q1800    Recommendations: Continue with vancomycin  I will narrow zosyn to ceftriaxone  Dr. Baxter Flattery to follow up over the weekend  Assessment: She has a post op wound infection/infected hematoma, superficial, following a left inteertrochanteric fracture with nailing in October 2017.  Pocket down to the gluteus medius fascia but notdown to the bone.    Antibiotics: Vancomycin and zosyn  HPI: [name] [name] is a 75 y.o. female with DM, paroxysmal afib on coumadin, CHF and recently fell requiring left hip IM nail 1 month ago.  She was having increased pain, swelling and discharge and cultured in the office and taken to OR today.  Was empirically started on vancomycin and zosyn.  No culture growth from cultures yesterday and more sent today.  No fever.  intraoperativelly noted above.  Drain in place.   Patient states she gets occasional confusion from antibiotics, but no one in particular.    Review of Systems:  Constitutional: negative for fevers and chills Gastrointestinal: negative for diarrhea Integument/breast: negative for rash All other systems reviewed and are negative   Past Medical History:  Diagnosis Date  . Atrial fibrillation ([place])    SVT dx 2007, cath 2007 mild CAD, then had an cardioversion, ablation; still on coumadin , has occ palpitation, EKG 03-2010 NSR  . Blindness of left eye   . DIABETES MELLITUS, TYPE II 03/27/2006   dr [name]  . Eye muscle weakness    Right eye weakness after cataract surgery  . GERD (gastroesophageal reflux disease) 10/05/2011  . 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 HYPERLIPIDEMIA 03/27/2006  . HYPERTENSION 03/27/2006  . Intertrochanteric fracture of right hip ([place]) 07/13/2012  . 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 Osteopenia 2004   Dexa 2004 showed Osteopenia, DEXA 03/2007 normal  . Osteopenia   . Other chronic cystitis with hematuria   . Recurrent urinary tract infection    Seeing Urology  . 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 Seizures ([place])   . 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[place]) 7/59/1638    Social History  Substance Use Topics  . Smoking status: Former Smoker    Types: Cigarettes    Quit date: 03/02/1978  . Smokeless tobacco: Never Used  . Alcohol  use No    Family History  Problem Relation Age of Onset  . Diabetes Father   . Heart attack Father 76  . Diabetes Sister   . Cancer Neg Hx     no hx of colon or breast cancer    Allergies  Allergen Reactions  . Procaine Hcl Anaphylaxis    Physical Exam: Constitutional: in no apparent distress  Vitals:   01/03/16 1230 01/03/16 1300  BP: 135/88 (!) 119/38  Pulse:  69  Resp:  16  Temp: 98 F (36.7 C) 99 F (37.2 C)   EYES: anicteric ENMT: no thrush Cardiovascular: Cor RRR Respiratory: CTA B; normal respiratory effort GI: Bowel sounds are normal, liver is not enlarged,  spleen is not enlarged Musculoskeletal: no pedal edema noted, drain in place  Skin: negatives: no rash Hematologic: no cervical lad  Lab Results  Component Value Date   WBC 16.3 (H) 01/03/2016   HGB 8.1 (L) 01/03/2016   HCT 24.9 (L) 01/03/2016   MCV 90.9 01/03/2016   PLT 302 01/03/2016    Lab Results  Component Value Date   CREATININE 1.87 (H) 01/03/2016   BUN 40 (H) 01/03/2016   NA 130 (L) 01/03/2016   K 2.9 (L) 01/03/2016   CL 95 (L) 01/03/2016   CO2 23 01/03/2016    Lab Results  Component Value Date   ALT 11 (L) 01/02/2016   AST 22 01/02/2016   ALKPHOS 181 (H) 01/02/2016     Microbiology: Recent Results (from the past 240 hour(s))  Surgical PCR screen     Status: None   Collection Time: 01/02/16 10:00 PM  Result Value Ref Range Status   MRSA, PCR NEGATIVE NEGATIVE Final   Staphylococcus aureus NEGATIVE NEGATIVE Final    Comment:        The Xpert SA Assay (FDA approved for NASAL specimens in patients over 67 years of age), is one component of a comprehensive surveillance program.  Test performance has been validated by [facility] for patients greater than or equal to 49 year old. It is not intended to diagnose infection nor to guide or monitor treatment.     Scharlene Gloss, [place] for Infectious Disease [place] [email or web address] [phone] pager  [phone] cell 01/03/2016, 4:46 PM

## 2016-01-03 NOTE — Transfer of Care (Signed)
Immediate Anesthesia Transfer of Care Note  Patient: [name] [name]  Procedure(s) Performed: Procedure(s): IRRIGATION AND DEBRIDEMENT HIP (Left)  Patient Location: PACU  Anesthesia Type:General  Level of Consciousness: awake, alert , oriented and sedated  Airway & Oxygen Therapy: Patient Spontanous Breathing and Patient connected to nasal cannula oxygen  Post-op Assessment: Report given to RN, Post -op Vital signs reviewed and stable and Patient moving all extremities  Post vital signs: Reviewed and stable  Last Vitals:  Vitals:   01/02/16 2149 01/03/16 0700  BP: (!) 109/46 (!) 114/39  Pulse: 93 68  Resp: 16 16  Temp: 37.3 C 37.2 C    Last Pain:  Vitals:   01/03/16 0700  TempSrc: Oral  PainSc:          Complications: No apparent anesthesia complications

## 2016-01-03 NOTE — Brief Op Note (Signed)
01/02/2016 - 01/03/2016  12:28 PM  PATIENT:  [name] [name]  75 y.o. female  PRE-OPERATIVE DIAGNOSIS:  infected left hip  POST-OPERATIVE DIAGNOSIS:  infected left hip  PROCEDURE:  Procedure(s): IRRIGATION AND DEBRIDEMENT HIP (Left)  SURGEON:  Surgeon(s) and Role:    * [name], MD - Primary  PHYSICIAN ASSISTANT: Benjiman Core pa-c    ANESTHESIA:   general  EBL:  Total I/O In: 500 [I.V.:500] Out: 15 [Blood:15]  BLOOD ADMINISTERED:none  DRAINS: none   LOCAL MEDICATIONS USED:  NONE  SPECIMEN:  Wound cultures DISPOSITION OF SPECIMEN:  PATHOLOGY  COUNTS:  YES  TOURNIQUET:  * No tourniquets in log *  DICTATION: .Dragon Dictation  PLAN OF CARE: Admit to inpatient   PATIENT DISPOSITION:  PACU - hemodynamically stable.

## 2016-01-03 NOTE — Progress Notes (Signed)
Dr. Orene Desanctis notified of potassium level this AM 2.9. Pt received Toprol-XL at 7pm last night, per Dr. Orene Desanctis to hold toprol-XL scheduled this AM prior to surgery. No new orders at this time.

## 2016-01-03 NOTE — Anesthesia Procedure Notes (Signed)
Procedure Name: LMA Insertion Date/Time: 01/03/2016 11:07 AM Performed by: Scheryl Darter Pre-anesthesia Checklist: Patient identified, Emergency Drugs available, Suction available and Patient being monitored Patient Re-evaluated:Patient Re-evaluated prior to inductionOxygen Delivery Method: Circle System Utilized Preoxygenation: Pre-oxygenation with 100% oxygen Intubation Type: IV induction Ventilation: Mask ventilation without difficulty LMA: LMA inserted LMA Size: 4.0 Tube size: 4.0 mm Number of attempts: 1 Airway Equipment and Method: Bite block Placement Confirmation: positive ETCO2 Tube secured with: Tape Dental Injury: Teeth and Oropharynx as per pre-operative assessment

## 2016-01-03 NOTE — Anesthesia Preprocedure Evaluation (Signed)
Anesthesia Evaluation  Patient identified by MRN, date of birth, ID band Patient awake    Reviewed: Allergy & Precautions, NPO status , Patient's Chart, lab work & pertinent test results  History of Anesthesia Complications Negative for: history of anesthetic complications  Airway Mallampati: II  TM Distance: >3 FB Neck ROM: Full    Dental  (+) Upper Dentures, Lower Dentures, Dental Advisory Given   Pulmonary former smoker,    Pulmonary exam normal breath sounds clear to auscultation       Cardiovascular hypertension, + CAD and + Past MI  Normal cardiovascular exam+ dysrhythmias Atrial Fibrillation  Rhythm:Regular Rate:Normal  Left ventricle: The cavity size was normal. There was moderate   concentric hypertrophy. Systolic function was moderately reduced.   The estimated ejection fraction was in the range of 35% to 40%.   Diffuse hypokinesis. The study was not technically sufficient to   allow evaluation of LV diastolic dysfunction due to atrial   fibrillation.   Neuro/Psych  Headaches, Seizures -,  PSYCHIATRIC DISORDERS Depression  Neuromuscular disease    GI/Hepatic Neg liver ROS, GERD  ,  Endo/Other  diabetes  Renal/GU Renal InsufficiencyRenal disease     Musculoskeletal   Abdominal   Peds  Hematology   Anesthesia Other Findings   Reproductive/Obstetrics                             Anesthesia Physical  Anesthesia Plan  ASA: III  Anesthesia Plan: General   Post-op Pain Management:    Induction: Intravenous  Airway Management Planned: LMA  Additional Equipment:   Intra-op Plan:   Post-operative Plan: Extubation in OR  Informed Consent: I have reviewed the patients History and Physical, chart, labs and discussed the procedure including the risks, benefits and alternatives for the proposed anesthesia with the patient or authorized representative who has indicated his/her  understanding and acceptance.   Dental advisory given  Plan Discussed with: CRNA and Anesthesiologist  Anesthesia Plan Comments:         Anesthesia Quick Evaluation

## 2016-01-04 DIAGNOSIS — D62 Acute posthemorrhagic anemia: Secondary | ICD-10-CM | POA: Diagnosis not present

## 2016-01-04 LAB — BASIC METABOLIC PANEL
ANION GAP: 10 (ref 5–15)
BUN: 39 mg/dL — ABNORMAL HIGH (ref 6–20)
CHLORIDE: 99 mmol/L — AB (ref 101–111)
CO2: 23 mmol/L (ref 22–32)
Calcium: 7.8 mg/dL — ABNORMAL LOW (ref 8.9–10.3)
Creatinine, Ser: 1.82 mg/dL — ABNORMAL HIGH (ref 0.44–1.00)
GFR calc non Af Amer: 26 mL/min — ABNORMAL LOW (ref >60)
GFR, EST AFRICAN AMERICAN: 30 mL/min — AB (ref >60)
Glucose, Bld: 241 mg/dL — ABNORMAL HIGH (ref 65–99)
POTASSIUM: 3.8 mmol/L (ref 3.5–5.1)
SODIUM: 132 mmol/L — AB (ref 135–145)

## 2016-01-04 LAB — CBC
HEMATOCRIT: 19.3 % — AB (ref 36.0–46.0)
HEMOGLOBIN: 6.3 g/dL — AB (ref 12.0–15.0)
MCH: 29.6 pg (ref 26.0–34.0)
MCHC: 32.6 g/dL (ref 30.0–36.0)
MCV: 90.6 fL (ref 78.0–100.0)
PLATELETS: 271 10*3/uL (ref 150–400)
RBC: 2.13 MIL/uL — AB (ref 3.87–5.11)
RDW: 14.1 % (ref 11.5–15.5)
WBC: 11.2 10*3/uL — AB (ref 4.0–10.5)

## 2016-01-04 LAB — GLUCOSE, CAPILLARY
GLUCOSE-CAPILLARY: 297 mg/dL — AB (ref 65–99)
GLUCOSE-CAPILLARY: 200 mg/dL — AB (ref 65–99)
Glucose-Capillary: 272 mg/dL — ABNORMAL HIGH (ref 65–99)
Glucose-Capillary: 106 mg/dL — ABNORMAL HIGH (ref 65–99)

## 2016-01-04 LAB — PROTIME-INR
INR: 5.43
Prothrombin Time: 51.1 seconds — ABNORMAL HIGH (ref 11.4–15.2)

## 2016-01-04 LAB — PREPARE RBC (CROSSMATCH)

## 2016-01-04 LAB — URINE CULTURE

## 2016-01-04 LAB — MAGNESIUM: Magnesium: 2.6 mg/dL — ABNORMAL HIGH (ref 1.7–2.4)

## 2016-01-04 MED ORDER — DIPHENHYDRAMINE HCL 25 MG PO CAPS
25.0000 mg | ORAL_CAPSULE | Freq: Once | ORAL | Status: AC
  Administered 2016-01-04: 25 mg via ORAL
  Filled 2016-01-04: qty 1

## 2016-01-04 MED ORDER — SODIUM CHLORIDE 0.9 % IV SOLN: Freq: Once | INTRAVENOUS | Status: DC

## 2016-01-04 MED ORDER — PROSOURCE NO CARB PO LIQD
30.0000 mL | Freq: Two times a day (BID) | ORAL | Status: DC
  Filled 2016-01-04 (×3): qty 30

## 2016-01-04 MED ORDER — PRO-STAT SUGAR FREE PO LIQD
30.0000 mL | Freq: Two times a day (BID) | ORAL | Status: DC
  Administered 2016-01-04 – 2016-01-06 (×5): 30 mL via ORAL
  Filled 2016-01-04 (×4): qty 30

## 2016-01-04 MED ORDER — SACCHAROMYCES BOULARDII 250 MG PO CAPS
250.0000 mg | ORAL_CAPSULE | Freq: Two times a day (BID) | ORAL | Status: DC
  Administered 2016-01-04 – 2016-01-06 (×5): 250 mg via ORAL
  Filled 2016-01-04 (×5): qty 1

## 2016-01-04 MED ORDER — INSULIN ASPART PROT & ASPART (70-30 MIX) 100 UNIT/ML ~~LOC~~ SUSP
20.0000 [IU] | Freq: Two times a day (BID) | SUBCUTANEOUS | Status: DC
  Administered 2016-01-04 – 2016-01-06 (×5): 20 [IU] via SUBCUTANEOUS
  Filled 2016-01-04 (×2): qty 10

## 2016-01-04 MED ORDER — FUROSEMIDE 10 MG/ML IJ SOLN
20.0000 mg | Freq: Once | INTRAMUSCULAR | Status: AC
  Administered 2016-01-04: 20 mg via INTRAVENOUS
  Filled 2016-01-04: qty 2

## 2016-01-04 MED ORDER — CALCIUM CARBONATE-VITAMIN D 500-200 MG-UNIT PO TABS
1.0000 | ORAL_TABLET | Freq: Every day | ORAL | Status: DC
  Administered 2016-01-04 – 2016-01-06 (×3): 1 via ORAL
  Filled 2016-01-04 (×3): qty 1

## 2016-01-04 MED ORDER — ACETAMINOPHEN 325 MG PO TABS
650.0000 mg | ORAL_TABLET | Freq: Once | ORAL | Status: AC
  Administered 2016-01-04: 650 mg via ORAL
  Filled 2016-01-04: qty 2

## 2016-01-04 MED ORDER — LORAZEPAM 0.5 MG PO TABS: 0.5000 mg | ORAL_TABLET | Freq: Two times a day (BID) | ORAL | Status: DC | PRN

## 2016-01-04 NOTE — Discharge Instructions (Addendum)
Information on my medicine - Coumadin   (Warfarin)  This medication education was reviewed with me or my healthcare representative as part of my discharge preparation.  The pharmacist that spoke with me during my hospital stay was:  Saundra Shelling, [facility]  Why was Coumadin prescribed for you? Coumadin was prescribed for you because you have a blood clot or a medical condition that can cause an increased risk of forming blood clots. Blood clots can cause serious health problems by blocking the flow of blood to the heart, lung, or brain. Coumadin can prevent harmful blood clots from forming. As a reminder your indication for Coumadin is:   Deep Vein Thrombosis Treatment Afib  What test will check on my response to Coumadin? While on Coumadin (warfarin) you will need to have an INR test regularly to ensure that your dose is keeping you in the desired range. The INR (international normalized ratio) number is calculated from the result of the laboratory test called prothrombin time (PT).  If an INR APPOINTMENT HAS NOT ALREADY BEEN MADE FOR YOU please schedule an appointment to have this lab work done by your health care provider within 7 days. Your INR goal is usually a number between:  2 to 3 or your provider may give you a more narrow range like 2-2.5.  Ask your health care provider during an office visit what your goal INR is.  What  do you need to  know  About  COUMADIN? Take Coumadin (warfarin) exactly as prescribed by your healthcare provider about the same time each day.  DO NOT stop taking without talking to the doctor who prescribed the medication.  Stopping without other blood clot prevention medication to take the place of Coumadin may increase your risk of developing a new clot or stroke.  Get refills before you run out.  What do you do if you miss a dose? If you miss a dose, take it as soon as you remember on the same day then continue your regularly scheduled regimen the next day.  Do not  take two doses of Coumadin at the same time.  Important Safety Information A possible side effect of Coumadin (Warfarin) is an increased risk of bleeding. You should call your healthcare provider right away if you experience any of the following: ? Bleeding from an injury or your nose that does not stop. ? Unusual colored urine (red or dark brown) or unusual colored stools (red or black). ? Unusual bruising for unknown reasons. ? A serious fall or if you hit your head (even if there is no bleeding).  Some foods or medicines interact with Coumadin (warfarin) and might alter your response to warfarin. To help avoid this: ? Eat a balanced diet, maintaining a consistent amount of Vitamin K. ? Notify your provider about major diet changes you plan to make. ? Avoid alcohol or limit your intake to 1 drink for women and 2 drinks for men per day. (1 drink is 5 oz. wine, 12 oz. beer, or 1.5 oz. liquor.)  Make sure that ANY health care provider who prescribes medication for you knows that you are taking Coumadin (warfarin).  Also make sure the healthcare provider who is monitoring your Coumadin knows when you have started a new medication including herbals and non-prescription products.  Coumadin (Warfarin)  Major Drug Interactions  Increased Warfarin Effect Decreased Warfarin Effect  Alcohol (large quantities) Antibiotics (esp. Septra/Bactrim, Flagyl, Cipro) Amiodarone (Cordarone) Aspirin (ASA) Cimetidine (Tagamet) Megestrol (Megace) NSAIDs (ibuprofen, naproxen,  etc.) Piroxicam (Feldene) Propafenone (Rythmol SR) Propranolol (Inderal) Isoniazid (INH) Posaconazole (Noxafil) Barbiturates (Phenobarbital) Carbamazepine (Tegretol) Chlordiazepoxide (Librium) Cholestyramine (Questran) Griseofulvin Oral Contraceptives Rifampin Sucralfate (Carafate) Vitamin K   Coumadin (Warfarin) Major Herbal Interactions  Increased Warfarin Effect Decreased Warfarin Effect  Garlic Ginseng Ginkgo biloba  Coenzyme Q10 Green tea St. Johns wort    Coumadin (Warfarin) FOOD Interactions  Eat a consistent number of servings per week of foods HIGH in Vitamin K (1 serving =  cup)  Collards (cooked, or boiled & drained) Kale (cooked, or boiled & drained) Mustard greens (cooked, or boiled & drained) Parsley *serving size only =  cup Spinach (cooked, or boiled & drained) Swiss chard (cooked, or boiled & drained) Turnip greens (cooked, or boiled & drained)  Eat a consistent number of servings per week of foods MEDIUM-HIGH in Vitamin K (1 serving = 1 cup)  Asparagus (cooked, or boiled & drained) Broccoli (cooked, boiled & drained, or raw & chopped) Brussel sprouts (cooked, or boiled & drained) *serving size only =  cup Lettuce, raw (green leaf, endive, romaine) Spinach, raw Turnip greens, raw & chopped   These websites have more information on Coumadin (warfarin):  FailFactory.se; VeganReport.com.au;    ORTHOPEDIC DISCHARGE INSTRUCTIONS -MUST HAVE DAILY WOUND CHECKS BY WOUND CARE NURSE AT FACILITY.   -DAILY DRESSING CHANGES WITH GAUZE AND TAPE. DO NOT APPLY ANY CREAMS OR OINTMENTS TO INCISION.  DO NOT REMOVE SUTURES.   -IV AMPICILLIN EVERY 8 HOURS X 25 DAYS.  PATIENT HAS PICC LINE.   -WEIGHTBEAR AS TOLERATED LEFT LOWER EXTREMITY WITH PHYSICAL THERAPY. -OK TO SHOWER BUT NO TUB SOAKING.   -CALL OUR OFFICE IMMEDIATELY IF ANY QUESTIONS OR CONCERNS.

## 2016-01-04 NOTE — Clinical Social Work Note (Signed)
Clinical Social Work Assessment  Patient Details  Name: [name] [name] MRN: [id number] Date of Birth: 08/19/1940  Date of referral:  01/04/16               Reason for consult:  Discharge Planning                Permission sought to share information with:  Case Manager, Facility Sport and exercise psychologist, Family Supports Permission granted to share information::  Yes, Verbal Permission Granted  Name::        Agency::   (SNF)  Relationship::     Contact Information:     Housing/Transportation Living arrangements for the past 2 months:  Single Family Home Source of Information:  Patient, Medical Team Patient Interpreter Needed:  None Criminal Activity/Legal Involvement Pertinent to Current Situation/Hospitalization:  No - Comment as needed Significant Relationships:  Spouse Lives with:  Spouse Do you feel safe going back to the place where you live?  Yes Need for family participation in patient care:  Yes (Comment)  Care giving concerns: Pt and husband expressed no care giving concerns at this time. Pt and husband agreeable to SNF.    Social Worker assessment / plan: Holiday representative met with pt and husband and discussed CSW role with discharge planning. CSW also explained PT recommendation for SNF for short-term rehab. Pt husband informed CSW that pt admitted into hospital from SNF at [facility] and that pt and husband want pt to return to SNF at discharge.   Employment status:  Retired Forensic scientist:  Medicare PT Recommendations:  [place] / Referral to community resources:  [place]  Patient/Family's Response to care: Pt pleasant and sitting in recliner with husband by her side when CSW entered the room. Pt pleasant and appears to be happy with care she is receiving at the hospital.   Patient/Family's Understanding of and Emotional Response to Diagnosis, Current Treatment, and Prognosis: Pt and husband appear to have  a good understanding of reason for admission into the hospital and with pt care plan.   Emotional Assessment Appearance:  Appears younger than stated age, Well-Groomed Attitude/Demeanor/Rapport:   (Pleasant) Affect (typically observed):  Accepting, Appropriate, Happy, Hopeful, Pleasant Orientation:  Oriented to Self, Oriented to Place, Oriented to  Time, Oriented to Situation Alcohol / Substance use:  Not Applicable Psych involvement (Current and /or in the community):  No (Comment)  Discharge Needs  Concerns to be addressed:  Discharge Planning Concerns Readmission within the last 30 days:  Yes Current discharge risk:  None Barriers to Discharge:  Continued Medical Work up   [employer], LCSW 01/04/2016, 1:55 PM

## 2016-01-04 NOTE — Progress Notes (Signed)
Subjective: 1 Day Post-Op Procedure(s) (LRB): IRRIGATION AND DEBRIDEMENT HIP (Left) Patient reports pain as moderate.  Appreciate ID consult and recs.  She does want to increase her mobility today.  Objective: Vital signs in last 24 hours: Temp:  [97.6 F (36.4 C)-99 F (37.2 C)] 98.6 F (37 C) (11/04 0604) Pulse Rate:  [69-78] 70 (11/04 0604) Resp:  [12-16] 16 (11/04 0604) BP: (97-135)/(31-88) 111/42 (11/04 0604) SpO2:  [93 %-100 %] 96 % (11/04 0604)  Intake/Output from previous day: 11/03 0701 - 11/04 0700 In: 620 [P.O.:120; I.V.:500] Out: 15 [Blood:15] Intake/Output this shift: No intake/output data recorded.   Recent Labs  01/02/16 1705 01/03/16 0215 01/04/16 0320  HGB 9.3* 8.1* 6.3*    Recent Labs  01/03/16 0215 01/03/16 1504 01/04/16 0320  WBC 16.3*  --  11.2*  RBC 2.74* 2.57* 2.13*  HCT 24.9*  --  19.3*  PLT 302  --  271    Recent Labs  01/03/16 0215 01/04/16 0320  NA 130* 132*  K 2.9* 3.8  CL 95* 99*  CO2 23 23  BUN 40* 39*  CREATININE 1.87* 1.82*  GLUCOSE 170* 241*  CALCIUM 8.2* 7.8*    Recent Labs  01/03/16 0215 01/04/16 0320  INR 3.88 5.43*    Incision: scant drainage  HVAC in place with mild output   Assessment/Plan: 1 Day Post-Op Procedure(s) (LRB): IRRIGATION AND DEBRIDEMENT HIP (Left) Up with therapy Continue ABX therapy due to Post-op infection  Mcarthur Rossetti 01/04/2016, 8:34 AM

## 2016-01-04 NOTE — Progress Notes (Signed)
Clarified Dr. Cheri Fowler about the [facility] order. There are 3 RBC units been ordered ( two from Dr. Cheri Fowler and one from NP on call at previous night). Dr. Cheri Fowler confirmed that pt only need to received two units for today. MD 's order carry out and will monitor pt closely.

## 2016-01-04 NOTE — Progress Notes (Signed)
OT Cancellation and Discharge Note  Patient Details Name: [name] [name] MRN: [id number] DOB: 1940/08/09   Cancelled Treatment:    Reason Eval/Treat Not Completed: Other (comment) (Pt needs can be met at next venue of care) Pt will be returning to SNF, and all OT needs can be met at next venue of care. Thank you for this referral.   Jaci Carrel 01/04/2016, 1:43 PM  Hulda Humphrey OTR/L [phone]

## 2016-01-04 NOTE — Progress Notes (Signed)
PROGRESS NOTE/Consult    [place]  NKN:[id number] DOB: 11/05/1940 DOA: 01/02/2016 PCP: [name], MD    Brief Narrative:  75 year old female history of insulin-dependent diabetes, paroxysmal atrial fibrillation on chronic anticoagulation, history of chronic systolic heart failure, seizure disorder, GERD, recent diagnosis of left lower extremity DVT, status post left hip IM nail for hip fracture 12/01/2005 presented with increased pain, swelling and discharge at operative site and admitted for postop infection. [facility] hospitalist was consulted for management of chronic medical issues.   Assessment & Plan:   Principal Problem:   Left hip postoperative wound infection Active Problems:   Hereditary and idiopathic peripheral neuropathy   Essential hypertension   Seizure disorder (HCC)   DM (diabetes mellitus) ([place])   A-fib (HCC)   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HCC)   Type 2 diabetes mellitus with vascular disease ([place])   CAD (coronary artery disease)   Ischemic cardiomyopathy   Postoperative anemia due to acute blood loss   #1 left hip postoperative wound infection Status post sharp excisional debridement of left hip postop subcutaneous infected hematoma per orthopedics. Blood cultures have been obtained and are pending. Wound cultures pending. Was on IV vancomycin and IV Zosyn. ID have consulted on the patient and IV Zosyn has been changed to IV Rocephin. Per orthopedics and per ID.  #2 atrial fibrillation A normal sinus rhythm. Continue Toprol-XL for rate control. INR at 5.43 from 3.88. Coumadin on hold.  #3 hypokalemia/hypomagnesemia Replete. Keep magnesium greater than 2.  #4 seizure disorder Stable. Continue Keppra and dilantin  #5 systolic heart failure/CAD Stable. Currently compensated. Continue Lipitor. Diuretics and ACE inhibitor on hold. Continue gentle hydration.  #6 recent left lower extremity DVT 12/14/15 INR is supratherapeutic. Coumadin per pharmacy.  #7 diabetes  mellitus Hemoglobin A1c was 7.3 on 12/02/2015. CBGs have ranged from 167-272. Increase 70/30 to 20 units twice daily. Continue sliding scale insulin.   #8 postop acute blood loss anemia/iron deficiency anemia Patient with no overt bleeding. Hemoglobin at 6.3 this morning. Transfuse 2 units packed red blood cells. Patient status post IV iron.    DVT prophylaxis: Per primary team Code Status: Full Family Communication: Updated patient. No family present. Disposition Plan: SNF per primary team.   Consultants:   Hospitalists: Dr Broadus [name]  ID: Dr. Linus Salmons 01/03/2016  Procedures:   Chest x-ray 01/02/2016  2 units packed red blood cells 01/04/2016 Sharp excisional debridement of left hip postoperative subcutaneous infected hematoma.--Per Dr. Lorin Mercy 01/03/2016  Antimicrobials:   IV Rocephin 01/03/2016  IV Zosyn 01/02/2016>>>> 01/03/2016  IV vancomycin 01/02/2016   Subjective: Patient sitting up in chair. No chest 1. No shortness of breath. Feels better. Patient states less pain.   Objective: Vitals:   01/03/16 2026 01/04/16 0015 01/04/16 0604 01/04/16 1015  BP: (!) 99/32 (!) 97/31 (!) 111/42 (!) 127/44  Pulse: 77 73 70 84  Resp: 16  16   Temp: 98.2 F (36.8 C) 98.7 F (37.1 C) 98.6 F (37 C)   TempSrc: Oral Axillary Oral   SpO2: 100% 93% 96% 96%    Intake/Output Summary (Last 24 hours) at 01/04/16 1256 Last data filed at 01/03/16 1700  Gross per 24 hour  Intake              120 ml  Output                0 ml  Net              120 ml   There were  no vitals filed for this visit.  Examination:  General exam: Alert. Sitting up in chair. Respiratory system: Clear to auscultation anterior lung fields. Respiratory effort normal. Cardiovascular system: S1 & S2 heard, RRR. No JVD, murmurs, rubs, gallops or clicks. No pedal edema. Gastrointestinal system: Abdomen is nondistended, soft and nontender. No organomegaly or masses felt. Normal bowel sounds heard. Central  nervous system: Alert and oriented. No focal neurological deficits. Extremities: Symmetric 5 x 5 power. Skin: No rashes, lesions or ulcers Psychiatry: Unable to assess as patient is postop and drowsy.    Data Reviewed: I have personally reviewed following labs and imaging studies  CBC:  Recent Labs Lab 01/02/16 1705 01/03/16 0215 01/04/16 0320  WBC 18.9* 16.3* 11.2*  NEUTROABS 15.5*  --   --   HGB 9.3* 8.1* 6.3*  HCT 28.2* 24.9* 19.3*  MCV 91.3 90.9 90.6  PLT 316 302 202   Basic Metabolic Panel:  Recent Labs Lab 01/02/16 1705 01/03/16 0215 01/03/16 1504 01/04/16 0320  NA 132* 130*  --  132*  K 3.6 2.9*  --  3.8  CL 96* 95*  --  99*  CO2 24 23  --  23  GLUCOSE 174* 170*  --  241*  BUN 40* 40*  --  39*  CREATININE 1.94* 1.87*  --  1.82*  CALCIUM 9.0 8.2*  --  7.8*  MG  --   --  1.6* 2.6*   GFR: Estimated Creatinine Clearance: 33.1 mL/min (by C-G formula based on SCr of 1.82 mg/dL (H)). Liver Function Tests:  Recent Labs Lab 01/02/16 1705  AST 22  ALT 11*  ALKPHOS 181*  BILITOT 0.7  PROT 6.5  ALBUMIN 2.4*   No results for input(s): LIPASE, AMYLASE in the last 168 hours. No results for input(s): AMMONIA in the last 168 hours. Coagulation Profile:  Recent Labs Lab 01/02/16 1705 01/03/16 0215 01/04/16 0320  INR 3.24 3.88 5.43*   Cardiac Enzymes: No results for input(s): CKTOTAL, CKMB, CKMBINDEX, TROPONINI in the last 168 hours. BNP (last 3 results) No results for input(s): PROBNP in the last 8760 hours. HbA1C: No results for input(s): HGBA1C in the last 72 hours. CBG:  Recent Labs Lab 01/03/16 1015 01/03/16 1210 01/03/16 1614 01/04/16 0756 01/04/16 1150  GLUCAP 196* 174* 167* 272* 297*   Lipid Profile: No results for input(s): CHOL, HDL, LDLCALC, TRIG, CHOLHDL, LDLDIRECT in the last 72 hours. Thyroid Function Tests: No results for input(s): TSH, T4TOTAL, FREET4, T3FREE, THYROIDAB in the last 72 hours. Anemia Panel:  Recent Labs   01/03/16 1504  VITAMINB12 741  FOLATE 22.4  FERRITIN 309*  TIBC 132*  IRON 9*  RETICCTPCT 1.1   Sepsis Labs: No results for input(s): PROCALCITON, LATICACIDVEN in the last 168 hours.  Recent Results (from the past 240 hour(s))  Surgical PCR screen     Status: None   Collection Time: 01/02/16 10:00 PM  Result Value Ref Range Status   MRSA, PCR NEGATIVE NEGATIVE Final   Staphylococcus aureus NEGATIVE NEGATIVE Final    Comment:        The Xpert SA Assay (FDA approved for NASAL specimens in patients over 68 years of age), is one component of a comprehensive surveillance program.  Test performance has been validated by [facility] for patients greater than or equal to 51 year old. It is not intended to diagnose infection nor to guide or monitor treatment.   Culture, Urine     Status: Abnormal   Collection Time: 01/02/16 11:58  PM  Result Value Ref Range Status   Specimen Description URINE, RANDOM  Final   Special Requests NONE  Final   Culture MULTIPLE SPECIES PRESENT, SUGGEST RECOLLECTION (A)  Final   Report Status 01/04/2016 FINAL  Final  Culture, blood (Routine X 2) w Reflex to ID Panel     Status: None (Preliminary result)   Collection Time: 01/03/16  9:25 AM  Result Value Ref Range Status   Specimen Description BLOOD LEFT ARM  Final   Special Requests IN PEDIATRIC BOTTLE 3CC  Final   Culture NO GROWTH 1 DAY  Final   Report Status PENDING  Incomplete  Aerobic Culture (superficial specimen)     Status: None (Preliminary result)   Collection Time: 01/03/16 11:53 AM  Result Value Ref Range Status   Specimen Description HIP LEFT  Final   Special Requests POF ZOSYN AND VANC  Final   Gram Stain   Final    MODERATE WBC PRESENT, PREDOMINANTLY PMN MODERATE GRAM NEGATIVE RODS    Culture PENDING  Incomplete   Report Status PENDING  Incomplete  Culture, blood (Routine X 2) w Reflex to ID Panel     Status: None (Preliminary result)   Collection Time: 01/03/16  2:23 PM    Result Value Ref Range Status   Specimen Description BLOOD RIGHT ARM  Final   Special Requests IN PEDIATRIC BOTTLE 3CC  Final   Culture NO GROWTH < 24 HOURS  Final   Report Status PENDING  Incomplete         Radiology Studies: Portable Chest 1 View  Result Date: 01/02/2016 CLINICAL DATA:  Preoperative evaluation. History of atrial fibrillation EXAM: PORTABLE CHEST 1 VIEW COMPARISON:  December 01, 2015 FINDINGS: There is no edema or consolidation. Heart size and pulmonary vascularity are normal. There is atherosclerotic calcification in the aorta. Surgical clips overlie the right inferior paratracheal region, stable. No evident adenopathy. There is evidence of an old healed fracture of the lateral left eighth rib. IMPRESSION: No edema or consolidation.  Aortic atherosclerosis. Electronically Signed   By: Lowella Grip III M.D.   On: 01/02/2016 17:02        Scheduled Meds: . sodium chloride  10 mL/hr Intravenous Once  . sodium chloride   Intravenous Once  . sodium chloride   Intravenous Once  . atorvastatin  80 mg Oral q1800  . calcium-vitamin D  1 tablet Oral Daily  . cefTRIAXone (ROCEPHIN)  IV  2 g Intravenous Q24H  . docusate sodium  100 mg Oral BID  . escitalopram  10 mg Oral Daily  . feeding supplement (PRO-STAT SUGAR FREE 64)  30 mL Oral BID  . furosemide  20 mg Intravenous Once  . insulin aspart  0-9 Units Subcutaneous TID WC  . insulin aspart protamine- aspart  20 Units Subcutaneous BID WC  . levETIRAcetam  500 mg Oral Daily  . metoprolol succinate  25 mg Oral Daily  . pantoprazole  40 mg Oral Q1200  . phenytoin  200 mg Oral BID  . saccharomyces boulardii  250 mg Oral BID  . vancomycin  750 mg Intravenous Q12H  . Warfarin - Pharmacist Dosing Inpatient   Does not apply q1800   Continuous Infusions: . sodium chloride 75 mL/hr at 01/02/16 1855  . sodium chloride 10 mL/hr at 01/03/16 1042  . 0.9 % NaCl with KCl 20 mEq / L 90 mL/hr at 01/03/16 1458     LOS: 2 days     Time spent: 35 minutes  [facility], MD [facility] Pager [phone]  If 7PM-7AM, please contact night-coverage [email or web address] Password [email or web address] 01/04/2016, 12:56 PM

## 2016-01-04 NOTE — NC FL2 (Signed)
[place] LEVEL OF CARE SCREENING TOOL     IDENTIFICATION  Patient Name: [name] [name] Birthdate: 05-Sep-1940 Sex: female Admission Date (Current Location): 01/02/2016  [facility] and [place] Number:  Herbalist and Address:  [facility] [place]. [facility], [place] [address] [place], [place] [address]      Provider Number: [id number]  Attending Physician Name and Address:  [name], MD  Relative Name and Phone Number:       Current Level of Care: Hospital Recommended Level of Care: [place] Prior Approval Number:    Date Approved/Denied:   PASRR Number:   [id number]   Discharge Plan: SNF    Current Diagnoses: Patient Active Problem List   Diagnosis Date Noted  . Postoperative anemia due to acute blood loss 01/04/2016  . Left hip postoperative wound infection 01/02/2016  . Closed intertrochanteric fracture of hip, left, sequela 12/25/2015  . Hip fracture ([place]) 12/01/2015  . Anemia 12/01/2015  . Leukocytosis 12/01/2015  . Ankle fracture, left 09/17/2015  . Ankle fracture, bimalleolar, closed   . Bilateral fibular fractures 09/14/2015  . Hypertensive heart disease 05/20/2015  . CAD (coronary artery disease) 05/20/2015  . Ischemic cardiomyopathy 05/20/2015  . Acute systolic CHF (congestive heart failure) ([place]) 05/20/2015  . PAF (paroxysmal atrial fibrillation) ([place]) 05/20/2015  . Anticoagulated on Coumadin 05/20/2015  . Type 2 diabetes mellitus with vascular disease ([place]) 05/17/2015  . NSTEMI (non-ST elevated myocardial infarction) ([place])   . Bacteremia due to Escherichia coli 05/11/2015  . Acute kidney injury superimposed on chronic kidney disease ([place]) 05/11/2015  . Uncontrolled hypertension 05/11/2015  . 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[place]) 56/21/3086  . Atrial fibrillation with RVR ([place]) 05/06/2015  . PCP NOTES >>>>>>>>>>>>>>>> 12/28/2014  . Depression 05/17/2014  . Memory loss 05/17/2014  . Diabetes mellitus-PCP comments  01/23/2014  . A-fib ([place]) 01/23/2014  . Anxiety and depression, PCP notes  11/24/2013  . Severe obesity (BMI >= 40) ([place]) 07/14/2013  . Encounter for therapeutic drug monitoring 06/02/2013  . Dilantin toxicity 11/21/2012  . History of encephalitis 11/18/2012  . Localization-related (focal) (partial) epilepsy and epileptic syndromes with complex partial seizures, without mention of intractable epilepsy 09/27/2012  . Hyponatremia 07/16/2012  . Intertrochanteric fracture of right hip ([place]) 07/13/2012  . DM (diabetes mellitus) ([place]) 01/19/2012  . GERD (gastroesophageal reflux disease) 10/05/2011  . Shoulder pain 08/27/2011  . Seizure disorder ([place]) 05/24/2011  . UTI (lower urinary tract infection) 05/23/2011  . UTI (urinary tract infection) 05/22/2011  . Annual physical exam 02/10/2011  . Long term (current) use of anticoagulants 09/23/2010  . Atrial fibrillation ([place])   . 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 Hyperlipidemia 03/27/2006  . Hereditary and idiopathic peripheral neuropathy 03/27/2006  . 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 Essential hypertension 03/27/2006  . OSTEOPENIA 03/27/2006    Orientation RESPIRATION BLADDER Height & Weight     Self, Time, Situation, Place  Normal Incontinent Weight:   Height:     BEHAVIORAL SYMPTOMS/MOOD NEUROLOGICAL BOWEL NUTRITION STATUS   (none) Convulsions/Seizures Incontinent Diet (clear liquid)  AMBULATORY STATUS COMMUNICATION OF NEEDS Skin   Extensive Assist Verbally Surgical wounds                       Personal Care Assistance Level of Assistance  Bathing, Feeding, Dressing Bathing Assistance: Limited assistance Feeding assistance: Independent Dressing Assistance: Limited assistance     Functional Limitations Info  Sight, Hearing, Speech Sight Info: Adequate Hearing Info: Adequate Speech  Info: Adequate    SPECIAL CARE FACTORS FREQUENCY  PT (By licensed PT)     PT Frequency:  (5x/week)              Contractures  Contractures Info: Not present    Additional Factors Info  Code Status, Allergies Code Status Info:  (full) Allergies Info:  (Procaine Hcl)           Current Medications (01/04/2016):  This is the current hospital active medication list Current Facility-Administered Medications  Medication Dose Route Frequency Provider Last Rate Last Dose  . 0.9 %  sodium chloride infusion   Intravenous Continuous Domenic Polite, MD 75 mL/hr at 01/02/16 1855    . 0.9 %  sodium chloride infusion  10 mL/hr Intravenous Once Rica Koyanagi, MD      . 0.9 %  sodium chloride infusion   Intravenous Continuous Rica Koyanagi, MD 10 mL/hr at 01/03/16 1042    . 0.9 %  sodium chloride infusion   Intravenous Once Ritta Slot, NP      . 0.9 %  sodium chloride infusion   Intravenous Once Eugenie Filler, MD      . 0.9 % NaCl with KCl 20 mEq/ L  infusion   Intravenous Continuous Lanae Crumbly, PA-C 90 mL/hr at 01/03/16 1458    . acetaminophen (TYLENOL) tablet 650 mg  650 mg Oral Q6H PRN Lanae Crumbly, PA-C       Or  . acetaminophen (TYLENOL) suppository 650 mg  650 mg Rectal Q6H PRN Lanae Crumbly, PA-C      . atorvastatin (LIPITOR) tablet 80 mg  80 mg Oral q1800 Domenic Polite, MD   80 mg at 01/03/16 1800  . calcium-vitamin D (OSCAL WITH D) 500-200 MG-UNIT per tablet 1 tablet  1 tablet Oral Daily Eugenie Filler, MD   1 tablet at 01/04/16 1052  . cefTRIAXone (ROCEPHIN) 2 g in dextrose 5 % 50 mL IVPB  2 g Intravenous Q24H Thayer Headings, MD   2 g at 01/03/16 2129  . docusate sodium (COLACE) capsule 100 mg  100 mg Oral BID Lanae Crumbly, PA-C   100 mg at 01/04/16 1052  . escitalopram (LEXAPRO) tablet 10 mg  10 mg Oral Daily Domenic Polite, MD   10 mg at 01/04/16 1052  . feeding supplement (PRO-STAT SUGAR FREE 64) liquid 30 mL  30 mL Oral BID [name], MD   30 mL at 01/04/16 1053  . furosemide (LASIX) injection 20 mg  20 mg Intravenous Once Eugenie Filler, MD      . HYDROmorphone (DILAUDID) injection 0.5 mg   0.5 mg Intravenous Q3H PRN Lanae Crumbly, PA-C      . insulin aspart (novoLOG) injection 0-9 Units  0-9 Units Subcutaneous TID WC Domenic Polite, MD   5 Units at 01/04/16 1200  . insulin aspart protamine- aspart (NOVOLOG MIX 70/30) injection 20 Units  20 Units Subcutaneous BID WC Eugenie Filler, MD   20 Units at 01/04/16 1053  . levETIRAcetam (KEPPRA XR) 24 hr tablet 500 mg  500 mg Oral Daily Domenic Polite, MD   500 mg at 01/04/16 1050  . LORazepam (ATIVAN) tablet 0.5 mg  0.5 mg Oral BID PRN Eugenie Filler, MD      . metoCLOPramide [facility]) tablet 5-10 mg  5-10 mg Oral Q8H PRN Lanae Crumbly, PA-C       Or  . metoCLOPramide (REGLAN) injection 5-10 mg  5-10 mg  Intravenous Q8H PRN Lanae Crumbly, PA-C      . metoprolol succinate (TOPROL-XL) 24 hr tablet 25 mg  25 mg Oral Daily Domenic Polite, MD   25 mg at 01/02/16 1858  . ondansetron (ZOFRAN) tablet 4 mg  4 mg Oral Q6H PRN Lanae Crumbly, PA-C       Or  . ondansetron [facility]) injection 4 mg  4 mg Intravenous Q6H PRN Lanae Crumbly, PA-C      . oxyCODONE (Oxy IR/ROXICODONE) immediate release tablet 5-10 mg  5-10 mg Oral Q3H PRN Lanae Crumbly, PA-C      . pantoprazole (PROTONIX) EC tablet 40 mg  40 mg Oral Q1200 Domenic Polite, MD   40 mg at 01/04/16 1051  . phenytoin (DILANTIN) ER capsule 200 mg  200 mg Oral BID Domenic Polite, MD   200 mg at 01/04/16 1053  . saccharomyces boulardii (FLORASTOR) capsule 250 mg  250 mg Oral BID Eugenie Filler, MD   250 mg at 01/04/16 1053  . vancomycin (VANCOCIN) IVPB 750 mg/150 ml premix  750 mg Intravenous Q12H Gay Filler Carney, RPH   750 mg at 01/04/16 0509  . Warfarin - Pharmacist Dosing Inpatient   Does not apply [place], [place] at 01/03/16 1800     Discharge Medications: Please see discharge summary for a list of discharge medications.  Relevant Imaging Results:  Relevant Lab Results:   Additional Information SS#: [id number]  Junie Spencer, LCSW

## 2016-01-04 NOTE — Evaluation (Signed)
Physical Therapy Evaluation Patient Details Name: [name] [name] MRN: [id number] DOB: 18-Oct-1940 Today's Date: 01/04/2016   History of Present Illness  75 yo female admitted on 01/02/16 for wound infection debridement due to surgical site wound. S/p IM pin placement following fall on 12/27/15. PMH significant for HTN, CHF, DM2, Diabetic neuropathy, CAD, and Atrial Fib.   Clinical Impression  Pt presents 2 days post op wound debridement with wound vac in place. Pt motivated and moving well with therapy and eager to get OOB. HGB low prior to session and consulted with RN who is in agreement to continue with pt since she is non-symptomatic. O2 sats are stable and remain above 90% throughout session. Diastolic is slightly lower once sitting in chair but pt is not symptomatic. RN updated on pts current mobility status. Pt plans to return to SNF for continued rehab following DC from hospital. Pt will benefit from continued acute therapy services to address the below deficits in order to assist with smooth transition to SNF.     Follow Up Recommendations SNF    Equipment Recommendations  None recommended by PT (has all equipment in place)    Recommendations for Other Services       Precautions / Restrictions Precautions Precautions: Fall Precaution Comments: Fall risk due to multiple co-morbidities Restrictions Weight Bearing Restrictions: Yes LLE Weight Bearing: Weight bearing as tolerated      Mobility  Bed Mobility Overal bed mobility: Needs Assistance Bed Mobility: Supine to Sit     Supine to sit: Min assist     General bed mobility comments: Min A for safety with HOB elevated and use of railing on Left  Transfers Overall transfer level: Needs assistance Equipment used: Rolling walker (2 wheeled) Transfers: Sit to/from Stand Sit to Stand: Min assist         General transfer comment: Min A for safety from bed to RW requiring increased time to  stand  Ambulation/Gait Ambulation/Gait assistance: Min assist Ambulation Distance (Feet): 10 Feet Assistive device: Rolling walker (2 wheeled) Gait Pattern/deviations: Step-to pattern;Decreased step length - right;Decreased stance time - left;Antalgic Gait velocity: decreased Gait velocity interpretation: <1.8 ft/sec, indicative of risk for recurrent falls General Gait Details: Min A for safety with rw from bed to chair. Requires min cues for sequencing and upright posture during gait  Stairs            Wheelchair Mobility    Modified Rankin (Stroke Patients Only)       Balance Overall balance assessment: Needs assistance Sitting-balance support: Single extremity supported Sitting balance-Leahy Scale: Fair Sitting balance - Comments: supervision for safety without back support   Standing balance support: Bilateral upper extremity supported Standing balance-Leahy Scale: Poor Standing balance comment: min a at trunk for stability initially upon standing                             Pertinent Vitals/Pain Pain Assessment: 0-10 Pain Score: 2  Pain Location: Left Hip Pain Descriptors / Indicators: Aching;Burning Pain Intervention(s): Monitored during session;Repositioned    Home Living Family/patient expects to be discharged to:: Skilled nursing facility Living Arrangements: Spouse/significant other Available Help at Discharge: Family Type of Home: House Home Access: Stairs to enter Entrance Stairs-Rails: None Entrance Stairs-Number of Steps: 2 Home Layout: Multi-level Home Equipment: Environmental consultant - 2 wheels;Cane - single point;Grab bars - tub/shower;Grab bars - toilet Additional Comments: Plan to DC back to SNF upon DC, then home.  Prior Function Level of Independence: Independent               Hand Dominance   Dominant Hand: Right    Extremity/Trunk Assessment   Upper Extremity Assessment: Defer to OT evaluation           Lower Extremity  Assessment: LLE deficits/detail   LLE Deficits / Details: pt presents with 4/5 B Ankle, 4/5 grossly right hip and knee, 3/5 grossly left hip and knee.      Communication   Communication: No difficulties  Cognition Arousal/Alertness: Awake/alert Behavior During Therapy: WFL for tasks assessed/performed Overall Cognitive Status: Within Functional Limits for tasks assessed                      General Comments General comments (skin integrity, edema, etc.): wound vac present to left hip    Exercises Total Joint Exercises Ankle Circles/Pumps: AROM;Both;20 reps;Supine Quad Sets: AROM;Left;10 reps;Supine Heel Slides: AROM;Left;10 reps;Supine   Assessment/Plan    PT Assessment Patient needs continued PT services  PT Problem List Decreased strength;Decreased range of motion;Decreased activity tolerance;Decreased balance;Decreased mobility;Decreased knowledge of use of DME;Decreased safety awareness;Pain          PT Treatment Interventions DME instruction;Gait training;Stair training;Functional mobility training;Therapeutic activities;Therapeutic exercise;Balance training;Neuromuscular re-education;Patient/family education;Modalities;Manual techniques    PT Goals (Current goals can be found in the Care Plan section)  Acute Rehab PT Goals Patient Stated Goal: to get better and get out of bed PT Goal Formulation: With patient Potential to Achieve Goals: Good    Frequency Min 3X/week   Barriers to discharge        Co-evaluation               End of Session Equipment Utilized During Treatment: Gait belt Activity Tolerance: Patient limited by fatigue Patient left: in chair;with call bell/phone within reach Nurse Communication: Mobility status         Time: 9622-2979 PT Time Calculation (min) (ACUTE ONLY): 43 min   Charges:   PT Evaluation $PT Eval High Complexity: 1 Procedure PT Treatments $Therapeutic Exercise: 8-22 mins $Therapeutic Activity: 8-22  mins   PT G Codes:        Scheryl Marten PT, DPT  [phone]  01/04/2016, 10:45 AM

## 2016-01-04 NOTE — Progress Notes (Signed)
She remains afebrile, and WBC is starting to trend down. Her cultures are showing GNR infection, thus I will keep her ceftriaxone and discontinue the vancomycin since no need to cover MRSA at the present. We will see her tomorrow and follow up on cultures.

## 2016-01-04 NOTE — NC FL2 (Signed)
[place] LEVEL OF CARE SCREENING TOOL     IDENTIFICATION  Patient Name: [name] [name] Birthdate: [birth year] Sex: female Admission Date (Current Location): 01/02/2016  [facility] and [place] Number:  Herbalist and Address:  [facility] [place]. [facility], [place] [address], [place], [place] [address]      Provider Number: [id number]  Attending Physician Name and Address:  [name], MD  Relative Name and Phone Number:       Current Level of Care: Hospital Recommended Level of Care: [place] Prior Approval Number:    Date Approved/Denied:   PASRR Number:    Discharge Plan: SNF    Current Diagnoses: Patient Active Problem List   Diagnosis Date Noted  . Postoperative anemia due to acute blood loss 01/04/2016  . Left hip postoperative wound infection 01/02/2016  . Closed intertrochanteric fracture of hip, left, sequela 12/25/2015  . Hip fracture ([place]) 12/01/2015  . Anemia 12/01/2015  . Leukocytosis 12/01/2015  . Ankle fracture, left 09/17/2015  . Ankle fracture, bimalleolar, closed   . Bilateral fibular fractures 09/14/2015  . Hypertensive heart disease 05/20/2015  . CAD (coronary artery disease) 05/20/2015  . Ischemic cardiomyopathy 05/20/2015  . Acute systolic CHF (congestive heart failure) ([place]) 05/20/2015  . PAF (paroxysmal atrial fibrillation) ([place]) 05/20/2015  . Anticoagulated on Coumadin 05/20/2015  . Type 2 diabetes mellitus with vascular disease ([place]) 05/17/2015  . NSTEMI (non-ST elevated myocardial infarction) ([place])   . Bacteremia due to Escherichia coli 05/11/2015  . Acute kidney injury superimposed on chronic kidney disease ([place]) 05/11/2015  . Uncontrolled hypertension 05/11/2015  . 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[place]) 39/76/7341  . Atrial fibrillation with RVR ([place]) 05/06/2015  . PCP NOTES >>>>>>>>>>>>>>>> 12/28/2014  . Depression 05/17/2014  . Memory loss 05/17/2014  . Diabetes mellitus-PCP comments 01/23/2014  . A-fib  ([place]) 01/23/2014  . Anxiety and depression, PCP notes  11/24/2013  . Severe obesity (BMI >= 40) ([place]) 07/14/2013  . Encounter for therapeutic drug monitoring 06/02/2013  . Dilantin toxicity 11/21/2012  . History of encephalitis 11/18/2012  . Localization-related (focal) (partial) epilepsy and epileptic syndromes with complex partial seizures, without mention of intractable epilepsy 09/27/2012  . Hyponatremia 07/16/2012  . Intertrochanteric fracture of right hip ([place]) 07/13/2012  . DM (diabetes mellitus) ([place]) 01/19/2012  . GERD (gastroesophageal reflux disease) 10/05/2011  . Shoulder pain 08/27/2011  . Seizure disorder ([place]) 05/24/2011  . UTI (lower urinary tract infection) 05/23/2011  . UTI (urinary tract infection) 05/22/2011  . Annual physical exam 02/10/2011  . Long term (current) use of anticoagulants 09/23/2010  . Atrial fibrillation ([place])   . 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 Hyperlipidemia 03/27/2006  . Hereditary and idiopathic peripheral neuropathy 03/27/2006  . 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 Essential hypertension 03/27/2006  . OSTEOPENIA 03/27/2006    Orientation RESPIRATION BLADDER Height & Weight     Self, Time, Situation, Place  Normal Incontinent Weight:   Height:     BEHAVIORAL SYMPTOMS/MOOD NEUROLOGICAL BOWEL NUTRITION STATUS   (none) Convulsions/Seizures Incontinent Diet (clear liquid)  AMBULATORY STATUS COMMUNICATION OF NEEDS Skin   Extensive Assist Verbally Surgical wounds                       Personal Care Assistance Level of Assistance  Bathing, Feeding, Dressing Bathing Assistance: Limited assistance Feeding assistance: Independent Dressing Assistance: Limited assistance     Functional Limitations Info  Sight, Hearing, Speech Sight Info: Adequate Hearing Info: Adequate Speech Info: Adequate  SPECIAL CARE FACTORS FREQUENCY  PT (By licensed PT)     PT Frequency:  (5x/week)              Contractures Contractures Info: Not  present    Additional Factors Info  Code Status, Allergies Code Status Info:  (full) Allergies Info:  (Procaine Hcl)           Current Medications (01/04/2016):  This is the current hospital active medication list Current Facility-Administered Medications  Medication Dose Route Frequency Provider Last Rate Last Dose  . 0.9 %  sodium chloride infusion   Intravenous Continuous Domenic Polite, MD 75 mL/hr at 01/02/16 1855    . 0.9 %  sodium chloride infusion  10 mL/hr Intravenous Once Rica Koyanagi, MD      . 0.9 %  sodium chloride infusion   Intravenous Continuous Rica Koyanagi, MD 10 mL/hr at 01/03/16 1042    . 0.9 %  sodium chloride infusion   Intravenous Once Ritta Slot, NP      . 0.9 %  sodium chloride infusion   Intravenous Once Eugenie Filler, MD      . 0.9 % NaCl with KCl 20 mEq/ L  infusion   Intravenous Continuous Lanae Crumbly, PA-C 90 mL/hr at 01/03/16 1458    . acetaminophen (TYLENOL) tablet 650 mg  650 mg Oral Q6H PRN Lanae Crumbly, PA-C       Or  . acetaminophen (TYLENOL) suppository 650 mg  650 mg Rectal Q6H PRN Lanae Crumbly, PA-C      . atorvastatin (LIPITOR) tablet 80 mg  80 mg Oral q1800 Domenic Polite, MD   80 mg at 01/03/16 1800  . calcium-vitamin D (OSCAL WITH D) 500-200 MG-UNIT per tablet 1 tablet  1 tablet Oral Daily Eugenie Filler, MD   1 tablet at 01/04/16 1052  . cefTRIAXone (ROCEPHIN) 2 g in dextrose 5 % 50 mL IVPB  2 g Intravenous Q24H Thayer Headings, MD   2 g at 01/03/16 2129  . docusate sodium (COLACE) capsule 100 mg  100 mg Oral BID Lanae Crumbly, PA-C   100 mg at 01/04/16 1052  . escitalopram (LEXAPRO) tablet 10 mg  10 mg Oral Daily Domenic Polite, MD   10 mg at 01/04/16 1052  . feeding supplement (PRO-STAT SUGAR FREE 64) liquid 30 mL  30 mL Oral BID [name], MD   30 mL at 01/04/16 1053  . furosemide (LASIX) injection 20 mg  20 mg Intravenous Once Eugenie Filler, MD      . HYDROmorphone (DILAUDID) injection 0.5 mg  0.5 mg Intravenous Q3H  PRN Lanae Crumbly, PA-C      . insulin aspart (novoLOG) injection 0-9 Units  0-9 Units Subcutaneous TID WC Domenic Polite, MD   5 Units at 01/04/16 0900  . insulin aspart protamine- aspart (NOVOLOG MIX 70/30) injection 20 Units  20 Units Subcutaneous BID WC Eugenie Filler, MD   20 Units at 01/04/16 1053  . levETIRAcetam (KEPPRA XR) 24 hr tablet 500 mg  500 mg Oral Daily Domenic Polite, MD   500 mg at 01/04/16 1050  . LORazepam (ATIVAN) tablet 0.5 mg  0.5 mg Oral BID PRN Eugenie Filler, MD      . metoCLOPramide [facility]) tablet 5-10 mg  5-10 mg Oral Q8H PRN Lanae Crumbly, PA-C       Or  . metoCLOPramide (REGLAN) injection 5-10 mg  5-10 mg Intravenous Q8H PRN Alyson Locket  Ricard Dillon, PA-C      . metoprolol succinate (TOPROL-XL) 24 hr tablet 25 mg  25 mg Oral Daily Domenic Polite, MD   25 mg at 01/02/16 1858  . ondansetron (ZOFRAN) tablet 4 mg  4 mg Oral Q6H PRN Lanae Crumbly, PA-C       Or  . ondansetron [facility]) injection 4 mg  4 mg Intravenous Q6H PRN Lanae Crumbly, PA-C      . oxyCODONE (Oxy IR/ROXICODONE) immediate release tablet 5-10 mg  5-10 mg Oral Q3H PRN Lanae Crumbly, PA-C      . pantoprazole (PROTONIX) EC tablet 40 mg  40 mg Oral Q1200 Domenic Polite, MD   40 mg at 01/04/16 1051  . phenytoin (DILANTIN) ER capsule 200 mg  200 mg Oral BID Domenic Polite, MD   200 mg at 01/04/16 1053  . saccharomyces boulardii (FLORASTOR) capsule 250 mg  250 mg Oral BID Eugenie Filler, MD   250 mg at 01/04/16 1053  . vancomycin (VANCOCIN) IVPB 750 mg/150 ml premix  750 mg Intravenous Q12H Gay Filler Carney, RPH   750 mg at 01/04/16 0509  . Warfarin - Pharmacist Dosing Inpatient   Does not apply [place], [place] at 01/03/16 1800     Discharge Medications: Please see discharge summary for a list of discharge medications.  Relevant Imaging Results:  Relevant Lab Results:   Additional Information SS#: [id number]  Junie Spencer, LCSW

## 2016-01-04 NOTE — Progress Notes (Signed)
ANTICOAGULATION CONSULT NOTE - Initial Consult  Pharmacy Consult for Coumadin Indication: atrial fibrillation  Allergies  Allergen Reactions  . Procaine Hcl Anaphylaxis    Vital Signs: Temp: 98.6 F (37 C) (11/04 0604) Temp Source: Oral (11/04 0604) BP: 111/42 (11/04 0604) Pulse Rate: 70 (11/04 0604)  Assessment: 75 yo F with hx PAF, recent acute DVT on chronic Coumadin 5mg  daily exc for 7.5mg  on Tues/Fri. On hold since admit for I&D of hip. Now to restart on 11/3. Needs bridging after if INR falls below 2. INR today trending up to 5.43. Hgb down to 6.3, plts wnl. No s/s of bleed.  Goal of Therapy:  INR 2-3 Monitor platelets by anticoagulation protocol: Yes   Plan:  Hold Coumadin tonight Monitor daily INR, CBC, s/s of bleed  Belia Heman, PharmD PGY1 Pharmacy Resident [phone] (Pager) 01/04/2016 8:47 AM

## 2016-01-05 DIAGNOSIS — Y838 Other surgical procedures as the cause of abnormal reaction of the patient, or of later complication, without mention of misadventure at the time of the procedure: Secondary | ICD-10-CM

## 2016-01-05 DIAGNOSIS — T814XXD Infection following a procedure, subsequent encounter: Secondary | ICD-10-CM

## 2016-01-05 DIAGNOSIS — T814XXS Infection following a procedure, sequela: Secondary | ICD-10-CM

## 2016-01-05 DIAGNOSIS — L03116 Cellulitis of left lower limb: Secondary | ICD-10-CM

## 2016-01-05 LAB — BASIC METABOLIC PANEL
ANION GAP: 8 (ref 5–15)
BUN: 31 mg/dL — ABNORMAL HIGH (ref 6–20)
CALCIUM: 8 mg/dL — AB (ref 8.9–10.3)
CO2: 23 mmol/L (ref 22–32)
Chloride: 106 mmol/L (ref 101–111)
Creatinine, Ser: 1.34 mg/dL — ABNORMAL HIGH (ref 0.44–1.00)
GFR calc non Af Amer: 38 mL/min — ABNORMAL LOW (ref >60)
GFR, EST AFRICAN AMERICAN: 44 mL/min — AB (ref >60)
Glucose, Bld: 87 mg/dL (ref 65–99)
POTASSIUM: 3.8 mmol/L (ref 3.5–5.1)
Sodium: 137 mmol/L (ref 135–145)

## 2016-01-05 LAB — AEROBIC CULTURE  (SUPERFICIAL SPECIMEN)

## 2016-01-05 LAB — BODY FLUID CULTURE: GRAM STAIN: NONE SEEN

## 2016-01-05 LAB — PROTIME-INR
INR: 4.4
PROTHROMBIN TIME: 42 s — AB (ref 11.4–15.2)

## 2016-01-05 LAB — CBC
HEMATOCRIT: 27.7 % — AB (ref 36.0–46.0)
HEMOGLOBIN: 9.2 g/dL — AB (ref 12.0–15.0)
MCH: 29.2 pg (ref 26.0–34.0)
MCHC: 33.2 g/dL (ref 30.0–36.0)
MCV: 87.9 fL (ref 78.0–100.0)
Platelets: 318 10*3/uL (ref 150–400)
RBC: 3.15 MIL/uL — AB (ref 3.87–5.11)
RDW: 15.5 % (ref 11.5–15.5)
WBC: 8.6 10*3/uL (ref 4.0–10.5)

## 2016-01-05 LAB — GLUCOSE, CAPILLARY
GLUCOSE-CAPILLARY: 155 mg/dL — AB (ref 65–99)
GLUCOSE-CAPILLARY: 93 mg/dL (ref 65–99)
GLUCOSE-CAPILLARY: 66 mg/dL (ref 65–99)
GLUCOSE-CAPILLARY: 93 mg/dL (ref 65–99)
Glucose-Capillary: 85 mg/dL (ref 65–99)

## 2016-01-05 LAB — AEROBIC CULTURE W GRAM STAIN (SUPERFICIAL SPECIMEN)

## 2016-01-05 MED ORDER — SODIUM CHLORIDE 0.9% FLUSH
10.0000 mL | INTRAVENOUS | Status: DC | PRN
  Administered 2016-01-06: 10 mL
  Filled 2016-01-05: qty 40

## 2016-01-05 MED ORDER — SODIUM CHLORIDE 0.9 % IV SOLN
2.0000 g | Freq: Three times a day (TID) | INTRAVENOUS | Status: DC
  Administered 2016-01-05 – 2016-01-06 (×3): 2 g via INTRAVENOUS
  Filled 2016-01-05 (×5): qty 2000

## 2016-01-05 MED ORDER — SODIUM CHLORIDE 0.9% FLUSH: 10.0000 mL | Freq: Two times a day (BID) | INTRAVENOUS | Status: DC

## 2016-01-05 NOTE — Progress Notes (Signed)
ANTICOAGULATION CONSULT NOTE - Follow Up Consult  Pharmacy Consult:  Coumadin Indication: atrial fibrillation  Allergies  Allergen Reactions  . Procaine Hcl Anaphylaxis    Vital Signs: Temp: 97.5 F (36.4 C) (11/05 0436) Temp Source: Oral (11/05 0436) BP: 139/76 (11/05 0436) Pulse Rate: 73 (11/05 0436)  Labs:  Recent Labs  01/02/16 1705 01/03/16 0215 01/04/16 0320 01/05/16 0630  HGB 9.3* 8.1* 6.3* 9.2*  HCT 28.2* 24.9* 19.3* 27.7*  PLT 316 302 271 318  APTT 60*  --   --   --   LABPROT 33.8* 39.0* 51.1* 42.0*  INR 3.24 3.88 5.43* 4.40*  CREATININE 1.94* 1.87* 1.82* 1.34*    Estimated Creatinine Clearance: 44.9 mL/min (by C-G formula based on SCr of 1.34 mg/dL (H)).     Assessment: [name] [name] on Coumadin PTA for history of AFib and recent DVT.  Coumadin held since admit for hip I&D and elevated INRs.  It trended down to 4.4 today; no bleeding reported.  Goal of Therapy:  INR 2-3   Plan:  - Continue to hold Coumadin - Daily PT / INR - F/U micro data    Nowell Sites D. Mina Marble, PharmD, BCPS Pager:  [phone] 01/05/2016, 9:15 AM

## 2016-01-05 NOTE — Progress Notes (Signed)
Subjective: 2 Days Post-Op Procedure(s) (LRB): IRRIGATION AND DEBRIDEMENT HIP (Left) Patient reports pain as moderate.  No acute changes.  Objective: Vital signs in last 24 hours: Temp:  [97.5 F (36.4 C)-98.5 F (36.9 C)] 97.5 F (36.4 C) (11/05 0436) Pulse Rate:  [62-92] 73 (11/05 0436) Resp:  [14-18] 18 (11/05 0436) BP: (100-139)/(31-76) 139/76 (11/05 0436) SpO2:  [96 %-100 %] 97 % (11/05 0436)  Intake/Output from previous day: 11/04 0701 - 11/05 0700 In: 1666 [P.O.:450; I.V.:100; Blood:1116] Out: 10 [Drains:10] Intake/Output this shift: No intake/output data recorded.   Recent Labs  01/02/16 1705 01/03/16 0215 01/04/16 0320  HGB 9.3* 8.1* 6.3*    Recent Labs  01/03/16 0215 01/03/16 1504 01/04/16 0320  WBC 16.3*  --  11.2*  RBC 2.74* 2.57* 2.13*  HCT 24.9*  --  19.3*  PLT 302  --  271    Recent Labs  01/03/16 0215 01/04/16 0320  NA 130* 132*  K 2.9* 3.8  CL 95* 99*  CO2 23 23  BUN 40* 39*  CREATININE 1.87* 1.82*  GLUCOSE 170* 241*  CALCIUM 8.2* 7.8*    Recent Labs  01/04/16 0320 01/05/16 0630  INR 5.43* PENDING    Sensation intact distally Intact pulses distally Dorsiflexion/Plantar flexion intact Incision: scant drainage No cellulitis present Compartment soft  HVAC removed and new dressing placed by MD  Assessment/Plan: 2 Days Post-Op Procedure(s) (LRB): IRRIGATION AND DEBRIDEMENT HIP (Left) Up with therapy Continue ABX therapy due to Post-op infection  [name] [name] 01/05/2016, 7:40 AM

## 2016-01-05 NOTE — Progress Notes (Addendum)
Received a critical lab INR 4.40. Notified Dr. Grandville Silos. And MD acknowledged the report.  Pt is no sign of bleeding, dressing is clean and dry. pt' s INR is trending down and will monitor pt closely.

## 2016-01-05 NOTE — Care Management Note (Signed)
Case Management Note  Patient Details  Name: [name] [name] MRN: [id number] Date of Birth: 21-Mar-1940  Subjective/Objective:                  left hip pos op wound infection Action/Plan: Discharge planning Expected Discharge Date:                  Expected Discharge Plan:  [place]  In-House Referral:     Discharge planning Services  CM Consult  Post Acute Care Choice:    Choice offered to:  Patient  DME Arranged:  IV pump/equipment DME Agency:  NA  HH Arranged:  NA HH Agency:  NA  Status of Service:  Completed, signed off  If discussed at [place] of Stay Meetings, dates discussed:    Additional Comments: CM spoke with pt to confirm plan is to go to SNF post discharge; pt confirms. CM notes CSW aware and arranging.  No other CM needs were communicated. Dellie Catholic, RN 01/05/2016, 3:38 PM

## 2016-01-05 NOTE — Progress Notes (Signed)
Peripherally Inserted Central Catheter/Midline Placement  The IV Nurse has discussed with the patient and/or persons authorized to consent for the patient, the purpose of this procedure and the potential benefits and risks involved with this procedure.  The benefits include less needle sticks, lab draws from the catheter, and the patient may be discharged home with the catheter. Risks include, but not limited to, infection, bleeding, blood clot (thrombus formation), and puncture of an artery; nerve damage and irregular heartbeat and possibility to perform a PICC exchange if needed/ordered by physician.  Alternatives to this procedure were also discussed.  Bard Power PICC patient education guide, fact sheet on infection prevention and patient information card has been provided to patient /or left at bedside.    PICC/Midline Placement Documentation  PICC Single Lumen 01/05/16 PICC Right Brachial 40 cm 0 cm (Active)  Indication for Insertion or Continuance of Line Home intravenous therapies (PICC only) 01/05/2016  4:23 PM  Exposed Catheter (cm) 0 cm 01/05/2016  4:23 PM  Site Assessment Clean;Dry;Intact 01/05/2016  4:23 PM  Line Status Flushed;Saline locked;Blood return noted 01/05/2016  4:23 PM  Dressing Type Transparent 01/05/2016  4:23 PM  Dressing Status Clean;Dry;Intact 01/05/2016  4:23 PM  Dressing Change Due 01/12/16 01/05/2016  4:23 PM       Gordan Payment 01/05/2016, 4:24 PM

## 2016-01-05 NOTE — Progress Notes (Signed)
[place] for Infectious Disease    Date of Admission:  01/02/2016   Total days of antibiotics 4        Day 3 ceftriaxone           ID: [name] [name] is a 75 y.o. female with left hip pos op wound infection. Had I x D with OR CX+ proteus(pan sensitive) Principal Problem:   Left hip postoperative wound infection Active Problems:   Hereditary and idiopathic peripheral neuropathy   Essential hypertension   Seizure disorder (HCC)   DM (diabetes mellitus) ([name])   A-fib (HCC)   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HCC)   Type 2 diabetes mellitus with vascular disease (HCC)   CAD (coronary artery disease)   Ischemic cardiomyopathy   Postoperative anemia due to acute blood loss    Subjective: Afebrile, having some movement of left leg which she is happy that she can do since recent surgery  Medications:  . sodium chloride  10 mL/hr Intravenous Once  . sodium chloride   Intravenous Once  . sodium chloride   Intravenous Once  . atorvastatin  80 mg Oral q1800  . calcium-vitamin D  1 tablet Oral Daily  . cefTRIAXone (ROCEPHIN)  IV  2 g Intravenous Q24H  . docusate sodium  100 mg Oral BID  . escitalopram  10 mg Oral Daily  . feeding supplement (PRO-STAT SUGAR FREE 64)  30 mL Oral BID  . insulin aspart  0-9 Units Subcutaneous TID WC  . insulin aspart protamine- aspart  20 Units Subcutaneous BID WC  . levETIRAcetam  500 mg Oral Daily  . metoprolol succinate  25 mg Oral Daily  . pantoprazole  40 mg Oral Q1200  . phenytoin  200 mg Oral BID  . saccharomyces boulardii  250 mg Oral BID  . Warfarin - Pharmacist Dosing Inpatient   Does not apply q1800    Objective: Vital signs in last 24 hours: Temp:  [97.5 F (36.4 C)-98.5 F (36.9 C)] 97.5 F (36.4 C) (11/05 0436) Pulse Rate:  [62-92] 73 (11/05 0436) Resp:  [14-18] 18 (11/05 0436) BP: (100-139)/(31-76) 139/76 (11/05 0436) SpO2:  [97 %-100 %] 97 % (11/05 0436) Physical Exam  Constitutional:  oriented to person, place, and time. appears  well-developed and well-nourished. No distress.  HENT: [place]/AT, PERRLA, no scleral icterus Mouth/Throat: Oropharynx is clear and moist. No oropharyngeal exudate.  Cardiovascular: Normal rate, regular rhythm and normal heart sounds. Exam reveals no gallop and no friction rub.  No murmur heard.  Pulmonary/Chest: Effort normal and breath sounds normal. No respiratory distress.  has no wheezes.  Abdominal: Soft. Bowel sounds are normal.  exhibits no distension. There is no tenderness.  Ext: left hip is bandaged from surgery, no erythema or induration Skin: Skin is warm and dry. No rash noted. No erythema.  Psychiatric: a normal mood and affect.  behavior is normal.    Lab Results  Recent Labs  01/04/16 0320 01/05/16 0630  WBC 11.2* 8.6  HGB 6.3* 9.2*  HCT 19.3* 27.7*  NA 132* 137  K 3.8 3.8  CL 99* 106  CO2 23 23  BUN 39* 31*  CREATININE 1.82* 1.34*   Liver Panel  Recent Labs  01/02/16 1705  PROT 6.5  ALBUMIN 2.4*  AST 22  ALT 11*  ALKPHOS 181*  BILITOT 0.7   Sedimentation Rate  Recent Labs  01/02/16 1705  ESRSEDRATE 110*   C-Reactive Protein  Recent Labs  01/02/16 1705  CRP 21.1*  Microbiology: 11/3 blood cx ngtd 11/3 or cx = proteus (pan sensitive) Studies/Results: No results found.   Assessment/Plan: Proteus deep tissue infection, sparing joint = recommend to treat with 28 days of treatment, currently on day 4 of 28. She will need a picc line. Will switch to ampicillin.  Plan to get picc line  We will see her back in the ID clinic in 3 wk  Home health orders listed below  ------------------------- Diagnosis: Deep tissue infection of left hip  Culture Result: proteus  Allergies  Allergen Reactions  . Procaine Hcl Anaphylaxis    Discharge antibiotics: Per pharmacy protocol  ampicillin  Duration: 4 wk End Date: 11/29  [facility] Care Per Protocol:  Labs weekly while on IV antibiotics: x__ CBC with differential __x BMP  _x_ CRP _x_  ESR   _x_ Please pull PIC at completion of IV antibiotics   Fax weekly labs to [phone]  Clinic Follow Up Appt: [facility] in 3 wk    [place], [facility] for Infectious Diseases Cell: [phone] Pager: [phone]  01/05/2016, 12:55 PM

## 2016-01-05 NOTE — Progress Notes (Signed)
PROGRESS NOTE/Consult    [place]  HER:[id number] DOB: 10/09/1940 DOA: 01/02/2016 PCP: [name], MD    Brief Narrative:  75 year old female history of insulin-dependent diabetes, paroxysmal atrial fibrillation on chronic anticoagulation, history of chronic systolic heart failure, seizure disorder, GERD, recent diagnosis of left lower extremity DVT, status post left hip IM nail for hip fracture 12/01/2005 presented with increased pain, swelling and discharge at operative site and admitted for postop infection. [facility] hospitalist was consulted for management of chronic medical issues.   Assessment & Plan:   Principal Problem:   Left hip postoperative wound infection Active Problems:   Hereditary and idiopathic peripheral neuropathy   Essential hypertension   Seizure disorder (HCC)   DM (diabetes mellitus) ([place])   A-fib (HCC)   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HCC)   Type 2 diabetes mellitus with vascular disease ([place])   CAD (coronary artery disease)   Ischemic cardiomyopathy   Postoperative anemia due to acute blood loss   #1 left hip postoperative wound infection Status post sharp excisional debridement of left hip postop subcutaneous infected hematoma per orthopedics. Blood cultures have been obtained and are pending. Wound cultures with Proteus mirabilis and pansensitive. IV antibiotics have been narrowed down to IV ampicillin per ID. PICC line has been ordered by ID. Per orthopedics and per ID.  #2 atrial fibrillation In normal sinus rhythm. Continue Toprol-XL for rate control. INR at 4.40 from 5.43 from 3.88. Coumadin on hold.  #3 hypokalemia/hypomagnesemia Replete. Keep magnesium greater than 2.  #4 seizure disorder Stable. Continue Keppra and dilantin  #5 systolic heart failure/CAD Stable. Currently compensated. Continue Lipitor. Diuretics and ACE inhibitor on hold. NSL IVF.   #6 recent left lower extremity DVT 12/14/15 INR is supratherapeutic however trending down. Coumadin  per pharmacy.  #7 diabetes mellitus Hemoglobin A1c was 7.3 on 12/02/2015. CBGs have ranged from 93-155. Increased 70/30 to 20 units twice daily. Continue sliding scale insulin.   #8 postop acute blood loss anemia/iron deficiency anemia Patient with no overt bleeding. Hemoglobin at 6.3 on 01/04/2016. Patient status post 2 units packed red blood cells hemoglobin currently at 9.2. Patient status post IV iron.    DVT prophylaxis: Per primary team Code Status: Full Family Communication: Updated patient. No family present. Disposition Plan: SNF per primary team.   Consultants:   Hospitalists: Dr Broadus [name]  ID: Dr. Linus Salmons 01/03/2016  Procedures:   Chest x-ray 01/02/2016  2 units packed red blood cells 01/04/2016 Sharp excisional debridement of left hip postoperative subcutaneous infected hematoma.--Per Dr. Lorin Mercy 01/03/2016  Antimicrobials:   IV Rocephin 01/03/2016>>>>>01/05/2016  IV Zosyn 01/02/2016>>>> 01/03/2016  IV vancomycin 01/02/2016>>>>> 01/04/2016  IV ampicillin 01/05/2016   Subjective: Patient sitting up in chair. Patient feeling better. No shortness of breath. No chest pain.   Objective: Vitals:   01/04/16 2316 01/04/16 2336 01/05/16 0140 01/05/16 0436  BP: 100/72 (!) 100/55 110/64 139/76  Pulse: 92 65 68 73  Resp: 16 18  18   Temp: 98.2 F (36.8 C) 98.2 F (36.8 C) 97.5 F (36.4 C) 97.5 F (36.4 C)  TempSrc: Oral Oral Oral Oral  SpO2:    97%    Intake/Output Summary (Last 24 hours) at 01/05/16 1229 Last data filed at 01/05/16 0600  Gross per 24 hour  Intake             1426 ml  Output               10 ml  Net  1416 ml   There were no vitals filed for this visit.  Examination:  General exam: Alert. Sitting up in chair. Respiratory system: Clear to auscultation anterior lung fields. Respiratory effort normal. Cardiovascular system: S1 & S2 heard, RRR. No JVD, murmurs, rubs, gallops or clicks. No pedal edema. Gastrointestinal system:  Abdomen is nondistended, soft and nontender. No organomegaly or masses felt. Normal bowel sounds heard. Central nervous system: Alert and oriented. No focal neurological deficits. Extremities: Symmetric 5 x 5 power. Skin: No rashes, lesions or ulcers Psychiatry: Unable to assess as patient is postop and drowsy.    Data Reviewed: I have personally reviewed following labs and imaging studies  CBC:  Recent Labs Lab 01/02/16 1705 01/03/16 0215 01/04/16 0320 01/05/16 0630  WBC 18.9* 16.3* 11.2* 8.6  NEUTROABS 15.5*  --   --   --   HGB 9.3* 8.1* 6.3* 9.2*  HCT 28.2* 24.9* 19.3* 27.7*  MCV 91.3 90.9 90.6 87.9  PLT 316 302 271 712   Basic Metabolic Panel:  Recent Labs Lab 01/02/16 1705 01/03/16 0215 01/03/16 1504 01/04/16 0320 01/05/16 0630  NA 132* 130*  --  132* 137  K 3.6 2.9*  --  3.8 3.8  CL 96* 95*  --  99* 106  CO2 24 23  --  23 23  GLUCOSE 174* 170*  --  241* 87  BUN 40* 40*  --  39* 31*  CREATININE 1.94* 1.87*  --  1.82* 1.34*  CALCIUM 9.0 8.2*  --  7.8* 8.0*  MG  --   --  1.6* 2.6*  --    GFR: Estimated Creatinine Clearance: 44.9 mL/min (by C-G formula based on SCr of 1.34 mg/dL (H)). Liver Function Tests:  Recent Labs Lab 01/02/16 1705  AST 22  ALT 11*  ALKPHOS 181*  BILITOT 0.7  PROT 6.5  ALBUMIN 2.4*   No results for input(s): LIPASE, AMYLASE in the last 168 hours. No results for input(s): AMMONIA in the last 168 hours. Coagulation Profile:  Recent Labs Lab 01/02/16 1705 01/03/16 0215 01/04/16 0320 01/05/16 0630  INR 3.24 3.88 5.43* 4.40*   Cardiac Enzymes: No results for input(s): CKTOTAL, CKMB, CKMBINDEX, TROPONINI in the last 168 hours. BNP (last 3 results) No results for input(s): PROBNP in the last 8760 hours. HbA1C: No results for input(s): HGBA1C in the last 72 hours. CBG:  Recent Labs Lab 01/04/16 1150 01/04/16 1712 01/04/16 2106 01/05/16 0559 01/05/16 1143  GLUCAP 297* 200* 106* 85 155*   Lipid Profile: No results  for input(s): CHOL, HDL, LDLCALC, TRIG, CHOLHDL, LDLDIRECT in the last 72 hours. Thyroid Function Tests: No results for input(s): TSH, T4TOTAL, FREET4, T3FREE, THYROIDAB in the last 72 hours. Anemia Panel:  Recent Labs  01/03/16 1504  VITAMINB12 741  FOLATE 22.4  FERRITIN 309*  TIBC 132*  IRON 9*  RETICCTPCT 1.1   Sepsis Labs: No results for input(s): PROCALCITON, LATICACIDVEN in the last 168 hours.  Recent Results (from the past 240 hour(s))  Body Fluid Culture     Status: None   Collection Time: 01/02/16  5:29 PM  Result Value Ref Range Status   Culture Rare METHICILLIN RESISTANT STAPHYLOCOCCUS AUREUS  Final   Gram Stain Rare  Final   Gram Stain WBC present-predominately PMN  Final   Gram Stain No Organisms Seen  Final   Organism ID, Bacteria METHICILLIN RESISTANT STAPHYLOCOCCUS AUREUS  Final    Comment: Rifampin and Gentamicin should not be used as single drugs for treatment of Staph  infections. This organism DOES NOT demonstrate inducible Clindamycin resistance in vitro. Critical Results Called to,Read Back By and Verified With: DR [facility] @ 3267 ON [id number] BY [place]       Susceptibility   Methicillin resistant staphylococcus aureus -  (no method available)    OXACILLIN >=4 Resistant     CEFAZOLIN  Resistant     GENTAMICIN <=0.5 Sensitive     CIPROFLOXACIN >=8 Resistant     LEVOFLOXACIN >=8 Resistant     TRIMETH/SULFA >=320 Resistant     VANCOMYCIN 1 Sensitive     CLINDAMYCIN <=0.25 Sensitive     ERYTHROMYCIN >=8 Resistant     LINEZOLID 2 Sensitive     RIFAMPIN <=0.5 Sensitive     TETRACYCLINE <=1 Sensitive   Surgical PCR screen     Status: None   Collection Time: 01/02/16 10:00 PM  Result Value Ref Range Status   MRSA, PCR NEGATIVE NEGATIVE Final   Staphylococcus aureus NEGATIVE NEGATIVE Final    Comment:        The Xpert SA Assay (FDA approved for NASAL specimens in patients over 48 years of age), is one component of a comprehensive  surveillance program.  Test performance has been validated by [facility] for patients greater than or equal to 65 year old. It is not intended to diagnose infection nor to guide or monitor treatment.   Culture, Urine     Status: Abnormal   Collection Time: 01/02/16 11:58 PM  Result Value Ref Range Status   Specimen Description URINE, RANDOM  Final   Special Requests NONE  Final   Culture MULTIPLE SPECIES PRESENT, SUGGEST RECOLLECTION (A)  Final   Report Status 01/04/2016 FINAL  Final  Culture, blood (Routine X 2) w Reflex to ID Panel     Status: None (Preliminary result)   Collection Time: 01/03/16  9:25 AM  Result Value Ref Range Status   Specimen Description BLOOD LEFT ARM  Final   Special Requests IN PEDIATRIC BOTTLE 3CC  Final   Culture NO GROWTH 2 DAYS  Final   Report Status PENDING  Incomplete  Aerobic Culture (superficial specimen)     Status: None   Collection Time: 01/03/16 11:53 AM  Result Value Ref Range Status   Specimen Description HIP LEFT  Final   Special Requests POF ZOSYN AND VANC  Final   Gram Stain   Final    MODERATE WBC PRESENT, PREDOMINANTLY PMN MODERATE GRAM NEGATIVE RODS    Culture ABUNDANT PROTEUS MIRABILIS  Final   Report Status 01/05/2016 FINAL  Final   Organism ID, Bacteria PROTEUS MIRABILIS  Final      Susceptibility   Proteus mirabilis - MIC*    AMPICILLIN <=2 SENSITIVE Sensitive     CEFAZOLIN <=4 SENSITIVE Sensitive     CEFEPIME <=1 SENSITIVE Sensitive     CEFTAZIDIME <=1 SENSITIVE Sensitive     CEFTRIAXONE <=1 SENSITIVE Sensitive     CIPROFLOXACIN <=0.25 SENSITIVE Sensitive     GENTAMICIN <=1 SENSITIVE Sensitive     IMIPENEM 2 SENSITIVE Sensitive     TRIMETH/SULFA <=20 SENSITIVE Sensitive     AMPICILLIN/SULBACTAM <=2 SENSITIVE Sensitive     PIP/TAZO <=4 SENSITIVE Sensitive     * ABUNDANT PROTEUS MIRABILIS  Culture, blood (Routine X 2) w Reflex to ID Panel     Status: None (Preliminary result)   Collection Time: 01/03/16  2:23 PM   Result Value Ref Range Status   Specimen Description BLOOD RIGHT ARM  Final   Special  Requests IN PEDIATRIC BOTTLE 3CC  Final   Culture NO GROWTH 2 DAYS  Final   Report Status PENDING  Incomplete         Radiology Studies: No results found.      Scheduled Meds: . sodium chloride  10 mL/hr Intravenous Once  . sodium chloride   Intravenous Once  . sodium chloride   Intravenous Once  . atorvastatin  80 mg Oral q1800  . calcium-vitamin D  1 tablet Oral Daily  . cefTRIAXone (ROCEPHIN)  IV  2 g Intravenous Q24H  . docusate sodium  100 mg Oral BID  . escitalopram  10 mg Oral Daily  . feeding supplement (PRO-STAT SUGAR FREE 64)  30 mL Oral BID  . insulin aspart  0-9 Units Subcutaneous TID WC  . insulin aspart protamine- aspart  20 Units Subcutaneous BID WC  . levETIRAcetam  500 mg Oral Daily  . metoprolol succinate  25 mg Oral Daily  . pantoprazole  40 mg Oral Q1200  . phenytoin  200 mg Oral BID  . saccharomyces boulardii  250 mg Oral BID  . Warfarin - Pharmacist Dosing Inpatient   Does not apply q1800   Continuous Infusions: . sodium chloride 10 mL/hr at 01/03/16 1042  . 0.9 % NaCl with KCl 20 mEq / L 90 mL/hr at 01/05/16 0659     LOS: 3 days    Time spent: 69 minutes    THOMPSON,DANIEL, MD [facility] Pager [phone]  If 7PM-7AM, please contact night-coverage [email or web address] Password [email or web address] 01/05/2016, 12:29 PM

## 2016-01-06 ENCOUNTER — Encounter (HOSPITAL_COMMUNITY): Payer: Self-pay | Admitting: Orthopaedic Surgery

## 2016-01-06 DIAGNOSIS — B9562 Methicillin resistant Staphylococcus aureus infection as the cause of diseases classified elsewhere: Secondary | ICD-10-CM

## 2016-01-06 DIAGNOSIS — N183 Chronic kidney disease, stage 3 unspecified: Secondary | ICD-10-CM

## 2016-01-06 DIAGNOSIS — B964 Proteus (mirabilis) (morganii) as the cause of diseases classified elsewhere: Secondary | ICD-10-CM

## 2016-01-06 DIAGNOSIS — A499 Bacterial infection, unspecified: Secondary | ICD-10-CM

## 2016-01-06 LAB — CBC
HCT: 26.7 % — ABNORMAL LOW (ref 36.0–46.0)
HEMATOCRIT: 24.5 % — AB (ref 36.0–46.0)
HEMOGLOBIN: 8.1 g/dL — AB (ref 12.0–15.0)
HEMOGLOBIN: 8.8 g/dL — AB (ref 12.0–15.0)
MCH: 29.3 pg (ref 26.0–34.0)
MCH: 29.5 pg (ref 26.0–34.0)
MCHC: 33.1 g/dL (ref 30.0–36.0)
MCHC: 33 g/dL (ref 30.0–36.0)
MCV: 88.8 fL (ref 78.0–100.0)
MCV: 89.6 fL (ref 78.0–100.0)
Platelets: 333 10*3/uL (ref 150–400)
Platelets: 335 10*3/uL (ref 150–400)
RBC: 2.76 MIL/uL — ABNORMAL LOW (ref 3.87–5.11)
RBC: 2.98 MIL/uL — AB (ref 3.87–5.11)
RDW: 15.4 % (ref 11.5–15.5)
RDW: 15.1 % (ref 11.5–15.5)
WBC: 8.6 10*3/uL (ref 4.0–10.5)
WBC: 9.4 10*3/uL (ref 4.0–10.5)

## 2016-01-06 LAB — GLUCOSE, CAPILLARY
GLUCOSE-CAPILLARY: 93 mg/dL (ref 65–99)
GLUCOSE-CAPILLARY: 58 mg/dL — AB (ref 65–99)
GLUCOSE-CAPILLARY: 101 mg/dL — AB (ref 65–99)
GLUCOSE-CAPILLARY: 67 mg/dL (ref 65–99)
Glucose-Capillary: 137 mg/dL — ABNORMAL HIGH (ref 65–99)

## 2016-01-06 LAB — BASIC METABOLIC PANEL
ANION GAP: 8 (ref 5–15)
BUN: 20 mg/dL (ref 6–20)
CO2: 25 mmol/L (ref 22–32)
Calcium: 8 mg/dL — ABNORMAL LOW (ref 8.9–10.3)
Chloride: 105 mmol/L (ref 101–111)
Creatinine, Ser: 1.15 mg/dL — ABNORMAL HIGH (ref 0.44–1.00)
GFR calc Af Amer: 53 mL/min — ABNORMAL LOW (ref >60)
GFR, EST NON AFRICAN AMERICAN: 46 mL/min — AB (ref >60)
GLUCOSE: 140 mg/dL — AB (ref 65–99)
POTASSIUM: 3.8 mmol/L (ref 3.5–5.1)
Sodium: 138 mmol/L (ref 135–145)

## 2016-01-06 LAB — PROTIME-INR
INR: 3.84
Prothrombin Time: 38.7 seconds — ABNORMAL HIGH (ref 11.4–15.2)

## 2016-01-06 MED ORDER — PROMETHAZINE HCL 12.5 MG PO TABS: 12.5000 mg | ORAL_TABLET | Freq: Three times a day (TID) | ORAL | 0 refills | Status: DC | PRN

## 2016-01-06 MED ORDER — WARFARIN SODIUM 5 MG PO TABS: 5.0000 mg | ORAL_TABLET | Freq: Every day | ORAL | 0 refills | Status: DC

## 2016-01-06 MED ORDER — POLYSACCHARIDE IRON COMPLEX 150 MG PO CAPS
150.0000 mg | ORAL_CAPSULE | Freq: Every day | ORAL | Status: DC
  Administered 2016-01-06: 150 mg via ORAL
  Filled 2016-01-06: qty 1

## 2016-01-06 MED ORDER — HEPARIN SOD (PORK) LOCK FLUSH 100 UNIT/ML IV SOLN
250.0000 [IU] | INTRAVENOUS | Status: AC | PRN
  Administered 2016-01-06: 250 [IU]

## 2016-01-06 MED ORDER — DEXTROSE 5 % IV SOLN
2.0000 g | INTRAVENOUS | Status: DC
  Administered 2016-01-06: 2 g via INTRAVENOUS
  Filled 2016-01-06: qty 2

## 2016-01-06 MED ORDER — FERUMOXYTOL INJECTION 510 MG/17 ML
510.0000 mg | Freq: Once | INTRAVENOUS | Status: DC
  Filled 2016-01-06: qty 17

## 2016-01-06 MED ORDER — DEXTROSE 5 % IV SOLN: 2.0000 g | INTRAVENOUS | 0 refills | Status: DC

## 2016-01-06 MED ORDER — VANCOMYCIN HCL IN DEXTROSE 750-5 MG/150ML-% IV SOLN
750.0000 mg | Freq: Two times a day (BID) | INTRAVENOUS | Status: DC
  Administered 2016-01-06: 750 mg via INTRAVENOUS
  Filled 2016-01-06 (×2): qty 150

## 2016-01-06 MED ORDER — VANCOMYCIN HCL IN DEXTROSE 750-5 MG/150ML-% IV SOLN
750.0000 mg | Freq: Two times a day (BID) | INTRAVENOUS | 0 refills | Status: DC
Start: 2016-01-06 — End: 2016-01-29

## 2016-01-06 MED ORDER — OXYCODONE-ACETAMINOPHEN 5-325 MG PO TABS: 1.0000 | ORAL_TABLET | Freq: Four times a day (QID) | ORAL | 0 refills | Status: DC | PRN

## 2016-01-06 MED ORDER — SODIUM CHLORIDE 0.9 % IV SOLN: 2.0000 g | Freq: Three times a day (TID) | INTRAVENOUS | 0 refills | Status: DC

## 2016-01-06 NOTE — Care Management Important Message (Signed)
Important Message  Patient Details  Name: [name] [name] MRN: [id number] Date of Birth: [birth year]   Medicare Important Message Given:  Yes    Ardell Aaronson [address] 01/06/2016, 11:12 AM

## 2016-01-06 NOTE — Progress Notes (Signed)
Physical Therapy Treatment Patient Details Name: [name] [name] MRN: [id number] DOB: 1941-02-02 Today's Date: 01/06/2016    History of Present Illness 75 yo female admitted on 01/02/16 for wound infection debridement due to surgical site wound. S/p IM pin placement following fall on 12/27/15. PMH significant for HTN, CHF, DM2, Diabetic neuropathy, CAD, and Atrial Fib.     PT Comments    Pt presents with minimal c/o pain at 1/10 this session. Performed gait training inside room this visit requiring min a to maneuver IV otherwise pt is majority min guard to supervision for gait. Instructed on supine and seated exercises in order to loosen up mm to prepare for gait activities. Pt to return to SNF upon Dc. Pt slightly hesitant to perform gait outside room this session, but with good cadence and step length noted this visit.    Follow Up Recommendations  SNF     Equipment Recommendations  None recommended by PT    Recommendations for Other Services       Precautions / Restrictions Precautions Precautions: Fall Precaution Comments: Fall risk due to multiple co-morbidities Restrictions Weight Bearing Restrictions: Yes LLE Weight Bearing: Weight bearing as tolerated    Mobility  Bed Mobility Overal bed mobility: Needs Assistance Bed Mobility: Supine to Sit     Supine to sit: Supervision     General bed mobility comments: Supervision to EOB due to increased time required  Transfers Overall transfer level: Needs assistance Equipment used: Rolling walker (2 wheeled) Transfers: Sit to/from Stand Sit to Stand: Min guard         General transfer comment: Min gaurd from EOB to RW for safety  Ambulation/Gait Ambulation/Gait assistance: Min guard Ambulation Distance (Feet): 30 Feet Assistive device: Rolling walker (2 wheeled) Gait Pattern/deviations: Step-through pattern;Antalgic Gait velocity: decreased Gait velocity interpretation: <1.8 ft/sec, indicative of risk for  recurrent falls General Gait Details: Mild antalgia noted, min guard for safety   Stairs            Wheelchair Mobility    Modified Rankin (Stroke Patients Only)       Balance Overall balance assessment: Needs assistance Sitting-balance support: No upper extremity supported Sitting balance-Leahy Scale: Good Sitting balance - Comments: at EOB without back support   Standing balance support: Bilateral upper extremity supported Standing balance-Leahy Scale: Fair Standing balance comment: Min gaurd for safety with RW                    Cognition Arousal/Alertness: Awake/alert Behavior During Therapy: WFL for tasks assessed/performed Overall Cognitive Status: Within Functional Limits for tasks assessed                      Exercises Total Joint Exercises Ankle Circles/Pumps: AROM;Both;20 reps;Supine Quad Sets: AROM;Left;10 reps;Supine Heel Slides: AROM;Left;10 reps;Supine Hip ABduction/ADduction: AROM;10 reps;Supine;Left Long Arc Quad: AROM;10 reps;Seated;Left    General Comments        Pertinent Vitals/Pain Pain Assessment: 0-10 Pain Score: 1  Pain Location: Left Hip Pain Descriptors / Indicators: Aching;Burning Pain Intervention(s): Monitored during session;Repositioned    Home Living                      Prior Function            PT Goals (current goals can now be found in the care plan section) Acute Rehab PT Goals Patient Stated Goal: to get better and get out of bed Progress towards PT goals: Progressing toward goals  Frequency    Min 3X/week      PT Plan Current plan remains appropriate    Co-evaluation             End of Session Equipment Utilized During Treatment: Gait belt Activity Tolerance: Patient tolerated treatment well Patient left: in chair;with call bell/phone within reach     Time: 0934-1002 PT Time Calculation (min) (ACUTE ONLY): 28 min  Charges:  $Gait Training: 8-22  mins $Therapeutic Exercise: 8-22 mins                    G Codes:      Scheryl Marten PT, DPT  [phone]   01/06/2016, 10:09 AM

## 2016-01-06 NOTE — Progress Notes (Signed)
Patient ID: [name] [name], female   DOB: 1941-02-27, 75 y.o.   MRN: [id number] Office culture taken before admission and IV ABX grew MRSA. ID team dr.Synder called and she will adjust ABX so it is covered.

## 2016-01-06 NOTE — Progress Notes (Signed)
ANTICOAGULATION CONSULT NOTE - Follow Up Consult  Pharmacy Consult:  Coumadin Indication: atrial fibrillation  Allergies  Allergen Reactions  . Procaine Hcl Anaphylaxis    Vital Signs: Temp: 98.4 F (36.9 C) (11/06 0555) Temp Source: Oral (11/06 0555) BP: 123/41 (11/06 0555) Pulse Rate: 71 (11/06 0555)  Labs:  Recent Labs  01/04/16 0320 01/05/16 0630 01/06/16 0500  HGB 6.3* 9.2* 8.1*  HCT 19.3* 27.7* 24.5*  PLT 271 318 333  LABPROT 51.1* 42.0* 38.7*  INR 5.43* 4.40* 3.84  CREATININE 1.82* 1.34* 1.15*    Estimated Creatinine Clearance: 52.4 mL/min (by C-G formula based on SCr of 1.15 mg/dL (H)).   Assessment: 35 YOF on Coumadin PTA for history of AFib and recent DVT. Coumadin held since admit for hip I&D and elevated INR - trended down to 3.84 today; no bleeding reported.  Goal of Therapy:  INR 2-3   Plan:  - Continue to hold Coumadin - Daily PT / INR - Monitor for s/sx bleeding   Elicia Lamp, PharmD, BCPS Clinical Pharmacist 01/06/2016 9:47 AM

## 2016-01-06 NOTE — Progress Notes (Signed)
Called report to [place] at [employer]

## 2016-01-06 NOTE — Progress Notes (Signed)
PROGRESS NOTE/Consult    [place]  WUJ:[id number] DOB: May 21, 1940 DOA: 01/02/2016 PCP: [name], MD    Brief Narrative:  75 year old female history of insulin-dependent diabetes, paroxysmal atrial fibrillation on chronic anticoagulation, history of chronic systolic heart failure, seizure disorder, GERD, recent diagnosis of left lower extremity DVT, status post left hip IM nail for hip fracture 12/01/2005 presented with increased pain, swelling and discharge at operative site and admitted for postop infection. [facility] hospitalist was consulted for management of chronic medical issues.   Assessment & Plan:   Principal Problem:   Left hip postoperative wound infection Active Problems:   Hereditary and idiopathic peripheral neuropathy   Essential hypertension   Seizure disorder (HCC)   DM (diabetes mellitus) ([place])   A-fib (HCC)   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HCC)   Type 2 diabetes mellitus with vascular disease (HCC)   CAD (coronary artery disease)   Ischemic cardiomyopathy   Postoperative anemia due to acute blood loss   Decreased strength of lower extremity   Gram-negative infection   CKD (chronic kidney disease) stage 3, GFR 30-59 ml/min   #1 left hip postoperative wound infection Status post sharp excisional debridement of left hip postop subcutaneous infected hematoma per orthopedics. Blood cultures have been obtained and are pending. Wound cultures with Proteus mirabilis and pansensitive. IV antibiotics have been narrowed down to IV ampicillin per ID. PICC line has been ordered by ID. ID office cultures also grew MRSA. Antibiotics are being changed to daily Rocephin and vancomycin for total of 4 weeks. Per orthopedics and per ID.  #2 atrial fibrillation In normal sinus rhythm. Continue Toprol-XL for rate control. INR at 3.84 from 4.40 from 5.43 from 3.88. Coumadin on hold.  #3 hypokalemia/hypomagnesemia Replete. Keep magnesium greater than 2.  #4 seizure disorder Stable. Continue  Keppra and dilantin  #5 systolic heart failure/CAD Stable. Currently compensated. Continue Lipitor. Diuretics and ACE inhibitor will be resumed on discharge.  NSL IVF.   #6 recent left lower extremity DVT 12/14/15 INR is supratherapeutic however trending down. INR of 3.84. Likely hold Coumadin today. Coumadin per pharmacy. If patient is discharged to skilled nursing facility will likely need repeat PT/INR done tomorrow and depending on whether INR is therapeutic, it may be determined when Coumadin to be resumed or per pharmacy rec's.   #7 diabetes mellitus Hemoglobin A1c was 7.3 on 12/02/2015. CBGs have ranged from 93-137. Increased 70/30 to 20 units twice daily. Continue sliding scale insulin.   #8 postop acute blood loss anemia/iron deficiency anemia Patient with no overt bleeding. Hemoglobin at 6.3 on 01/04/2016. Patient status post 2 units packed red blood cells hemoglobin currently at 8.1. Patient status post IV iron. Will place on oral iron daily.  Per nursing primary team planning on discharging patient to skilled nursing facility. From a medicine standpoint patient is stable to be discharged. Will likely need to hold Coumadin on discharge due to supratherapeutic INR with close PT/INR follow-up at the facility and Coumadin reinstituted when appropriate and when INR within therapeutic range. We'll sign off for now.    DVT prophylaxis: Per primary team Code Status: Full Family Communication: Updated patient. No family present. Disposition Plan: SNF per primary team.   Consultants:   Hospitalists: Dr Broadus [name]  ID: Dr. Linus Salmons 01/03/2016  Procedures:   Chest x-ray 01/02/2016  2 units packed red blood cells 01/04/2016 Sharp excisional debridement of left hip postoperative subcutaneous infected hematoma.--Per Dr. Lorin Mercy 01/03/2016  Antimicrobials:   IV Rocephin 01/03/2016>>>>>01/05/2016  IV Zosyn 01/02/2016>>>> 01/03/2016  IV vancomycin 01/02/2016>>>>> 01/04/2016  IV  ampicillin 01/05/2016>>>> 01/06/2016  IV Rocephin 01/06/2016  IV vancomycin 01/06/2016   Subjective: Patient states she's feeling well. Patient states pain has improved. No shortness of breath. No chest pain. No bleeding. Tolerating current diet.   Objective: Vitals:   01/05/16 0436 01/05/16 1348 01/05/16 1928 01/06/16 0555  BP: 139/76 (!) 123/48 122/73 (!) 123/41  Pulse: 73 80 70 71  Resp: 18 16 16 16   Temp: 97.5 F (36.4 C) 97.8 F (36.6 C) 97.7 F (36.5 C) 98.4 F (36.9 C)  TempSrc: Oral  Oral Oral  SpO2: 97% 100% 100% 98%    Intake/Output Summary (Last 24 hours) at 01/06/16 0917 Last data filed at 01/06/16 0657  Gross per 24 hour  Intake              370 ml  Output                0 ml  Net              370 ml   There were no vitals filed for this visit.  Examination:  General exam: Alert.  Respiratory system: Clear to auscultation anterior lung fields. Respiratory effort normal. Cardiovascular system: S1 & S2 heard, RRR. No JVD, murmurs, rubs, gallops or clicks. No pedal edema. Gastrointestinal system: Abdomen is nondistended, soft and nontender. No organomegaly or masses felt. Normal bowel sounds heard. Central nervous system: Alert and oriented. No focal neurological deficits. Extremities: Symmetric 5 x 5 power. Skin: No rashes, lesions or ulcers Psychiatry: Normal mood. Normal affect. Good insight, good judgement.    Data Reviewed: I have personally reviewed following labs and imaging studies  CBC:  Recent Labs Lab 01/02/16 1705 01/03/16 0215 01/04/16 0320 01/05/16 0630 01/06/16 0500  WBC 18.9* 16.3* 11.2* 8.6 8.6  NEUTROABS 15.5*  --   --   --   --   HGB 9.3* 8.1* 6.3* 9.2* 8.1*  HCT 28.2* 24.9* 19.3* 27.7* 24.5*  MCV 91.3 90.9 90.6 87.9 88.8  PLT 316 302 271 318 256   Basic Metabolic Panel:  Recent Labs Lab 01/02/16 1705 01/03/16 0215 01/03/16 1504 01/04/16 0320 01/05/16 0630 01/06/16 0500  NA 132* 130*  --  132* 137 138  K 3.6 2.9*   --  3.8 3.8 3.8  CL 96* 95*  --  99* 106 105  CO2 24 23  --  23 23 25   GLUCOSE 174* 170*  --  241* 87 140*  BUN 40* 40*  --  39* 31* 20  CREATININE 1.94* 1.87*  --  1.82* 1.34* 1.15*  CALCIUM 9.0 8.2*  --  7.8* 8.0* 8.0*  MG  --   --  1.6* 2.6*  --   --    GFR: Estimated Creatinine Clearance: 52.4 mL/min (by C-G formula based on SCr of 1.15 mg/dL (H)). Liver Function Tests:  Recent Labs Lab 01/02/16 1705  AST 22  ALT 11*  ALKPHOS 181*  BILITOT 0.7  PROT 6.5  ALBUMIN 2.4*   No results for input(s): LIPASE, AMYLASE in the last 168 hours. No results for input(s): AMMONIA in the last 168 hours. Coagulation Profile:  Recent Labs Lab 01/02/16 1705 01/03/16 0215 01/04/16 0320 01/05/16 0630 01/06/16 0500  INR 3.24 3.88 5.43* 4.40* 3.84   Cardiac Enzymes: No results for input(s): CKTOTAL, CKMB, CKMBINDEX, TROPONINI in the last 168 hours. BNP (last 3 results) No results for input(s): PROBNP in the last 8760 hours. HbA1C: No results for input(s):  HGBA1C in the last 72 hours. CBG:  Recent Labs Lab 01/05/16 1143 01/05/16 1637 01/05/16 2054 01/05/16 2244 01/06/16 0600  GLUCAP 155* 93 66 93 137*   Lipid Profile: No results for input(s): CHOL, HDL, LDLCALC, TRIG, CHOLHDL, LDLDIRECT in the last 72 hours. Thyroid Function Tests: No results for input(s): TSH, T4TOTAL, FREET4, T3FREE, THYROIDAB in the last 72 hours. Anemia Panel:  Recent Labs  01/03/16 1504  VITAMINB12 741  FOLATE 22.4  FERRITIN 309*  TIBC 132*  IRON 9*  RETICCTPCT 1.1   Sepsis Labs: No results for input(s): PROCALCITON, LATICACIDVEN in the last 168 hours.  Recent Results (from the past 240 hour(s))  Body Fluid Culture     Status: None   Collection Time: 01/02/16  5:29 PM  Result Value Ref Range Status   Culture Rare METHICILLIN RESISTANT STAPHYLOCOCCUS AUREUS  Final   Gram Stain Rare  Final   Gram Stain WBC present-predominately PMN  Final   Gram Stain No Organisms Seen  Final   Organism  ID, Bacteria METHICILLIN RESISTANT STAPHYLOCOCCUS AUREUS  Final    Comment: Rifampin and Gentamicin should not be used as single drugs for treatment of Staph infections. This organism DOES NOT demonstrate inducible Clindamycin resistance in vitro. Critical Results Called to,Read Back By and Verified With: DR [facility] @ 1856 ON [id number] BY [place]       Susceptibility   Methicillin resistant staphylococcus aureus -  (no method available)    OXACILLIN >=4 Resistant     CEFAZOLIN  Resistant     GENTAMICIN <=0.5 Sensitive     CIPROFLOXACIN >=8 Resistant     LEVOFLOXACIN >=8 Resistant     TRIMETH/SULFA >=320 Resistant     VANCOMYCIN 1 Sensitive     CLINDAMYCIN <=0.25 Sensitive     ERYTHROMYCIN >=8 Resistant     LINEZOLID 2 Sensitive     RIFAMPIN <=0.5 Sensitive     TETRACYCLINE <=1 Sensitive   Surgical PCR screen     Status: None   Collection Time: 01/02/16 10:00 PM  Result Value Ref Range Status   MRSA, PCR NEGATIVE NEGATIVE Final   Staphylococcus aureus NEGATIVE NEGATIVE Final    Comment:        The Xpert SA Assay (FDA approved for NASAL specimens in patients over 51 years of age), is one component of a comprehensive surveillance program.  Test performance has been validated by [facility] for patients greater than or equal to 55 year old. It is not intended to diagnose infection nor to guide or monitor treatment.   Culture, Urine     Status: Abnormal   Collection Time: 01/02/16 11:58 PM  Result Value Ref Range Status   Specimen Description URINE, RANDOM  Final   Special Requests NONE  Final   Culture MULTIPLE SPECIES PRESENT, SUGGEST RECOLLECTION (A)  Final   Report Status 01/04/2016 FINAL  Final  Culture, blood (Routine X 2) w Reflex to ID Panel     Status: None (Preliminary result)   Collection Time: 01/03/16  9:25 AM  Result Value Ref Range Status   Specimen Description BLOOD LEFT ARM  Final   Special Requests IN PEDIATRIC BOTTLE 3CC  Final   Culture NO GROWTH 2 DAYS   Final   Report Status PENDING  Incomplete  Aerobic Culture (superficial specimen)     Status: None   Collection Time: 01/03/16 11:53 AM  Result Value Ref Range Status   Specimen Description HIP LEFT  Final   Special Requests POF ZOSYN  AND VANC  Final   Gram Stain   Final    MODERATE WBC PRESENT, PREDOMINANTLY PMN MODERATE GRAM NEGATIVE RODS    Culture ABUNDANT PROTEUS MIRABILIS  Final   Report Status 01/05/2016 FINAL  Final   Organism ID, Bacteria PROTEUS MIRABILIS  Final      Susceptibility   Proteus mirabilis - MIC*    AMPICILLIN <=2 SENSITIVE Sensitive     CEFAZOLIN <=4 SENSITIVE Sensitive     CEFEPIME <=1 SENSITIVE Sensitive     CEFTAZIDIME <=1 SENSITIVE Sensitive     CEFTRIAXONE <=1 SENSITIVE Sensitive     CIPROFLOXACIN <=0.25 SENSITIVE Sensitive     GENTAMICIN <=1 SENSITIVE Sensitive     IMIPENEM 2 SENSITIVE Sensitive     TRIMETH/SULFA <=20 SENSITIVE Sensitive     AMPICILLIN/SULBACTAM <=2 SENSITIVE Sensitive     PIP/TAZO <=4 SENSITIVE Sensitive     * ABUNDANT PROTEUS MIRABILIS  Culture, blood (Routine X 2) w Reflex to ID Panel     Status: None (Preliminary result)   Collection Time: 01/03/16  2:23 PM  Result Value Ref Range Status   Specimen Description BLOOD RIGHT ARM  Final   Special Requests IN PEDIATRIC BOTTLE 3CC  Final   Culture NO GROWTH 2 DAYS  Final   Report Status PENDING  Incomplete         Radiology Studies: No results found.      Scheduled Meds: . ampicillin (OMNIPEN) IV  2 g Intravenous Q8H  . atorvastatin  80 mg Oral q1800  . calcium-vitamin D  1 tablet Oral Daily  . docusate sodium  100 mg Oral BID  . escitalopram  10 mg Oral Daily  . feeding supplement (PRO-STAT SUGAR FREE 64)  30 mL Oral BID  . insulin aspart  0-9 Units Subcutaneous TID WC  . insulin aspart protamine- aspart  20 Units Subcutaneous BID WC  . iron polysaccharides  150 mg Oral Daily  . levETIRAcetam  500 mg Oral Daily  . metoprolol succinate  25 mg Oral Daily  .  pantoprazole  40 mg Oral Q1200  . phenytoin  200 mg Oral BID  . saccharomyces boulardii  250 mg Oral BID  . sodium chloride flush  10-40 mL Intracatheter Q12H  . Warfarin - Pharmacist Dosing Inpatient   Does not apply q1800   Continuous Infusions: . sodium chloride 10 mL/hr at 01/03/16 1042     LOS: 4 days    Time spent: 46 minutes    THOMPSON,DANIEL, MD [facility] Pager [phone]  If 7PM-7AM, please contact night-coverage [email or web address] Password [email or web address] 01/06/2016, 9:17 AM

## 2016-01-06 NOTE — Progress Notes (Signed)
Patient will discharge to [facility] Anticipated discharge date: 11/6 Family notified: pt to notify family Transportation by [facility]- called at 3:!5pm  [place] signing off.  Jorge Ny, LCSW Clinical Social Worker [phone]

## 2016-01-06 NOTE — Progress Notes (Signed)
Per PA note pt stable for transfer back to [facility]- facility can accept pt back today after DC summary completed.  CSW will continue to follow  Jorge Ny, LCSW Clinical Social Worker [phone]

## 2016-01-06 NOTE — Consult Note (Signed)
   [facility] CM Inpatient Consult   01/06/2016  [name] [name] 01-11-1941 [id number]    Patient screened for potential [facility] Care Management services. Chart reviewed. Noted current discharge plan is for SNF.  There are no identifiable [facility] Care Management needs at this time. If patient's post hospital needs change, please place a [facility] Care Management consult. For questions please contact:  Marthenia Rolling, [place], RN,BSN [facility] Liaison [phone]

## 2016-01-06 NOTE — Progress Notes (Signed)
    [place] for Infectious Disease   Reason for visit: Follow up on wound infection  Interval History: culture grew Proteus and MRSA  Physical Exam: Constitutional:  Vitals:   01/05/16 1928 01/06/16 0555  BP: 122/73 (!) 123/41  Pulse: 70 71  Resp: 16 16  Temp: 97.7 F (36.5 C) 98.4 F (36.9 C)   patient appears in NAD  Impression: Stable wound infection  Plan: 1.  I will change antibiotics to once daily ceftriaxone and add vancomycin and duration the same of 4 weeks.   We will see her in our clinic in 3-4 weeks.

## 2016-01-06 NOTE — Discharge Summary (Signed)
Patient ID: BRADEE COMMON MRN: [id number] DOB/AGE: 1940-10-25 75 y.o.  Admit date: 01/02/2016 Discharge date: 01/06/2016  Admission Diagnoses:  Principal Problem:   Left hip postoperative wound infection Active Problems:   Hereditary and idiopathic peripheral neuropathy   Essential hypertension   Seizure disorder (HCC)   DM (diabetes mellitus) ([place])   A-fib (HCC)   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HCC)   Type 2 diabetes mellitus with vascular disease (HCC)   CAD (coronary artery disease)   Ischemic cardiomyopathy   Postoperative anemia due to acute blood loss   Decreased strength of lower extremity   Gram-negative infection   CKD (chronic kidney disease) stage 3, GFR 30-59 ml/min   Discharge Diagnoses:  Principal Problem:   Left hip postoperative wound infection Active Problems:   Hereditary and idiopathic peripheral neuropathy   Essential hypertension   Seizure disorder (HCC)   DM (diabetes mellitus) ([place])   A-fib (HCC)   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HCC)   Type 2 diabetes mellitus with vascular disease (HCC)   CAD (coronary artery disease)   Ischemic cardiomyopathy   Postoperative anemia due to acute blood loss   Decreased strength of lower extremity   Gram-negative infection   CKD (chronic kidney disease) stage 3, GFR 30-59 ml/min  status post Procedure(s): IRRIGATION AND DEBRIDEMENT HIP  Past Medical History:  Diagnosis Date  . Atrial fibrillation ([place])    SVT dx 2007, cath 2007 mild CAD, then had an cardioversion, ablation; still on coumadin , has occ palpitation, EKG 03-2010 NSR  . Blindness of left eye   . DIABETES MELLITUS, TYPE II 03/27/2006   dr [name]  . Eye muscle weakness    Right eye weakness after cataract surgery  . GERD (gastroesophageal reflux disease) 10/05/2011  . 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 HYPERLIPIDEMIA 03/27/2006  . HYPERTENSION 03/27/2006  . Intertrochanteric fracture of right hip ([place]) 07/13/2012  . 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 Osteopenia  2004   Dexa 2004 showed Osteopenia, DEXA 03/2007 normal  . Osteopenia   . Other chronic cystitis with hematuria   . Recurrent urinary tract infection    Seeing Urology  . 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 Seizures ([place])   . 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[place]) 6/76/1950    Surgeries: Procedure(s): IRRIGATION AND DEBRIDEMENT HIP on 01/02/2016 - 01/03/2016   Consultants:   Discharged Condition: Improved  Hospital Course: JOHNAE FRILEY is an 75 y.o. female who was admitted 01/02/2016 for operative treatment of Left hip postoperative wound infection. Patient was status post left hip IM nail for hip fracture to October 2017. She had been having increased left hip pain redness swelling and drainage. Patient failed conservative treatments (please see the history and physical for the specifics) and had severe unremitting pain that affects sleep, daily activities and work/hobbies. After pre-op clearance, the patient was taken to the operating room on 01/02/2016 - 01/03/2016 and underwent  Procedure(s): [place].   Postoperative we consulted try hospitalist and infectious disease. Patient had PICC line placed for IV antibiotics. At the time of discharge infectious disease and recommended 4 weeks of IV vancomycin and ceftriaxone. Patient discharged in stable condition. Patient was given perioperative antibiotics: Anti-infectives    Start     Dose/Rate Route Frequency Ordered Stop   01/07/16 0000  cefTRIAXone 2 g in dextrose 5 % 50 mL    Comments:  MUST TAKE X 4 WEEKS. IF ANY QUESTIONS CALL DR Lorin Mercy OR Kennen Stammer PA-C   2 g  100 mL/hr over 30 Minutes Intravenous Every 24 hours 01/06/16 1448 02/04/16 2359   01/06/16 1000  cefTRIAXone (ROCEPHIN) 2 g in dextrose 5 % 50 mL IVPB     2 g 100 mL/hr over 30 Minutes Intravenous Every 24 hours 01/06/16 0921     01/06/16 1000  vancomycin (VANCOCIN) IVPB 750 mg/150 ml premix     750 mg 150 mL/hr over 60 Minutes Intravenous Every 12 hours 01/06/16  0945     01/06/16 0000  ampicillin 2 g in sodium chloride 0.9 % 50 mL  Status:  Discontinued    Comments:  MUST USE THIS ANTIBIOTIC EVERY 8 HOURS X 25 DAYS.  IF ANY QUESTIONS CALL DR Lorin Mercy OR Xavyer Steenson PA-C.   2 g 150 mL/hr over 20 Minutes Intravenous Every 8 hours 01/06/16 0817 01/06/16    01/06/16 0000  Vancomycin (VANCOCIN) 750-5 MG/150ML-% SOLN    Comments:  MUST TAKE X 4 WEEKS.  IF ANY QUESTIONS CALL DR Para March Severin Bou PA-C   750 mg 150 mL/hr over 60 Minutes Intravenous Every 12 hours 01/06/16 1448 02/04/16 2359   01/05/16 1400  ampicillin (OMNIPEN) 2 g in sodium chloride 0.9 % 50 mL IVPB  Status:  Discontinued     2 g 150 mL/hr over 20 Minutes Intravenous Every 8 hours 01/05/16 1300 01/06/16 0921   01/03/16 1800  cefTRIAXone (ROCEPHIN) 2 g in dextrose 5 % 50 mL IVPB  Status:  Discontinued     2 g 100 mL/hr over 30 Minutes Intravenous Every 24 hours 01/03/16 1650 01/05/16 1300   01/03/16 1137  gentamicin (GARAMYCIN) injection  Status:  Discontinued       As needed 01/03/16 1137 01/03/16 1202   01/03/16 1136  vancomycin (VANCOCIN) powder  Status:  Discontinued       As needed 01/03/16 1136 01/03/16 1202   01/02/16 1700  vancomycin (VANCOCIN) IVPB 750 mg/150 ml premix  Status:  Discontinued     750 mg 150 mL/hr over 60 Minutes Intravenous Every 12 hours 01/02/16 1642 01/04/16 1453   01/02/16 1645  piperacillin-tazobactam (ZOSYN) IVPB 3.375 g  Status:  Discontinued     3.375 g 12.5 mL/hr over 240 Minutes Intravenous Every 8 hours 01/02/16 1642 01/03/16 1650       Patient was given sequential compression devices and early ambulation to prevent DVT.   Patient benefited maximally from hospital stay and there were no complications. At the time of discharge, the patient was urinating/moving their bowels without difficulty, tolerating a regular diet, pain is controlled with oral pain medications and they have been cleared by PT/OT.   Recent vital signs: Patient Vitals for the past  24 hrs:  BP Temp Temp src Pulse Resp SpO2  01/06/16 0555 (!) 123/41 98.4 F (36.9 C) Oral 71 16 98 %  01/05/16 1928 122/73 97.7 F (36.5 C) Oral 70 16 100 %     Recent laboratory studies:  Recent Labs  01/05/16 0630 01/06/16 0500 01/06/16 1116  WBC 8.6 8.6 9.4  HGB 9.2* 8.1* 8.8*  HCT 27.7* 24.5* 26.7*  PLT 318 333 335  NA 137 138  --   K 3.8 3.8  --   CL 106 105  --   CO2 23 25  --   BUN 31* 20  --   CREATININE 1.34* 1.15*  --   GLUCOSE 87 140*  --   INR 4.40* 3.84  --   CALCIUM 8.0* 8.0*  --      Discharge  Medications:     Medication List    STOP taking these medications   CENTRUM SILVER ADULT 50+ PO   enoxaparin 30 MG/0.3ML injection Commonly known as:  LOVENOX   FLUARIX QUADRIVALENT 0.5 ML injection Generic drug:  Influenza vac split quadrivalent PF     TAKE these medications   atorvastatin 80 MG tablet Commonly known as:  LIPITOR Take 80 mg by mouth daily.   calcium-vitamin D 500-200 MG-UNIT tablet Commonly known as:  OSCAL WITH D Take 1 tablet by mouth daily.   cefTRIAXone 2 g in dextrose 5 % 50 mL Inject 2 g into the vein daily. Start taking on:  01/07/2016   clopidogrel 75 MG tablet Commonly known as:  PLAVIX Take 1 tablet (75 mg total) by mouth daily.   escitalopram 10 MG tablet Commonly known as:  LEXAPRO TAKE TWO TABLETS BY MOUTH ONCE DAILY   ferrous sulfate 325 (65 FE) MG EC tablet Take 325 mg by mouth daily with breakfast.   furosemide 40 MG tablet Commonly known as:  LASIX Take 1 tablet (40 mg total) by mouth 2 (two) times daily.   insulin NPH-regular Human (70-30) 100 UNIT/ML injection Commonly known as:  NOVOLIN 70/30 Inject 14-30 Units into the skin See admin instructions. Inject 30 units in the morning. 14 units-2 times daily with lunch and dinnner   levETIRAcetam 500 MG 24 hr tablet Commonly known as:  KEPPRA XR Take 1 tablet (500 mg total) by mouth daily.   lisinopril 5 MG tablet Commonly known as:   PRINIVIL,ZESTRIL Take 1 tablet (5 mg total) by mouth daily.   LORazepam 0.5 MG tablet Commonly known as:  ATIVAN Take 1 tablet (0.5 mg total) by mouth 2 (two) times daily as needed for anxiety.   methocarbamol 500 MG tablet Commonly known as:  ROBAXIN Take 1 tablet (500 mg total) by mouth every 6 (six) hours as needed for muscle spasms.   metoprolol succinate 50 MG 24 hr tablet Commonly known as:  TOPROL-XL Take 25 mg by mouth daily. Take with or immediately following a meal.   NOVOLOG [place] Inject into the skin.   oxyCODONE-acetaminophen 5-325 MG tablet Commonly known as:  ROXICET Take 1 tablet by mouth every 6 (six) hours as needed for severe pain.   pantoprazole 40 MG tablet Commonly known as:  PROTONIX Take 1 tablet (40 mg total) by mouth daily.   phenytoin 200 MG ER capsule Commonly known as:  DILANTIN Take 1 capsule (200 mg total) by mouth 2 (two) times daily. Reduce to 1 tablet daily x 2 months then every other day x 2 months and stop What changed:  when to take this  additional instructions   promethazine 12.5 MG tablet Commonly known as:  PHENERGAN Take 1 tablet (12.5 mg total) by mouth every 8 (eight) hours as needed for nausea or vomiting. What changed:  when to take this   protein supplement Liqd Take 30 mLs by mouth 2 (two) times daily.   saccharomyces boulardii 250 MG capsule Commonly known as:  FLORASTOR Take 1 capsule (250 mg total) by mouth 2 (two) times daily.   Vancomycin 750-5 MG/150ML-% Soln Commonly known as:  VANCOCIN Inject 150 mLs (750 mg total) into the vein every 12 (twelve) hours.   warfarin 5 MG tablet Commonly known as:  COUMADIN Take 1 tablet (5 mg total) by mouth daily.       Diagnostic Studies: Dg Ankle Complete Left  Result Date: 12/14/2015 CLINICAL DATA:  Pt states  that she fell in her bathroom x 2 weeks ago trying to get to the toilet. Pt states that when she fell she landed on her left side but doesn't remember much else.  C/o pain in left hip and left ankle. No other complaints. EXAM: LEFT ANKLE COMPLETE - 3+ VIEW COMPARISON:  Plain film of the left ankle dated 09/16/2015. FINDINGS: Osseous structures are diffusely osteopenic which limits characterization of osseous detail. There is no acute-appearing fracture identified. There is an old healed fracture of the distal left fibula. Ankle mortise is symmetric. Visualized portions of the hindfoot and midfoot appear intact and normally aligned. IMPRESSION: No acute findings.  Old healed fracture of the distal left fibula. Electronically Signed   By: Franki Cabot M.D.   On: 12/14/2015 15:33   Portable Chest 1 View  Result Date: 01/02/2016 CLINICAL DATA:  Preoperative evaluation. History of atrial fibrillation EXAM: PORTABLE CHEST 1 VIEW COMPARISON:  December 01, 2015 FINDINGS: There is no edema or consolidation. Heart size and pulmonary vascularity are normal. There is atherosclerotic calcification in the aorta. Surgical clips overlie the right inferior paratracheal region, stable. No evident adenopathy. There is evidence of an old healed fracture of the lateral left eighth rib. IMPRESSION: No edema or consolidation.  Aortic atherosclerosis. Electronically Signed   By: Lowella Grip III M.D.   On: 01/02/2016 17:02   Dg Knee Complete 4 Views Left  Result Date: 12/14/2015 CLINICAL DATA:  Pain after fall 2 weeks ago. EXAM: LEFT KNEE - COMPLETE 4+ VIEW COMPARISON:  None. FINDINGS: An intra medullary rod is seen in the distal femur. No fracture, dislocation, or joint effusion. IMPRESSION: No acute abnormality. Electronically Signed   By: Dorise Bullion III M.D   On: 12/14/2015 14:18   Dg Hip Unilat W Or Wo Pelvis 2-3 Views Left  Result Date: 12/14/2015 CLINICAL DATA:  Intertrochanteric fracture December 01, 2015.  Pain. EXAM: DG HIP (WITH OR WITHOUT PELVIS) 2-3V LEFT COMPARISON:  December 01, 2015 FINDINGS: A gamma nail and rod project through the proximal right femur. The pelvic  bones are intact. A gamma nail and rod project across the recent left intertrochanteric fracture. No interval fracture or evidence of hardware failure identified. No dislocation. IMPRESSION: No acute abnormality. Lateral skin staples. New gamma nail and intertrochanteric rod across the left intertrochanteric fracture. Electronically Signed   By: Dorise Bullion III M.D   On: 12/14/2015 14:20   Dg Femur Min 2 Views Left  Result Date: 12/14/2015 CLINICAL DATA:  Pt states that she fell in her bathroom x 2 weeks ago trying to get to the toilet. Pt states that when she fell she landed on her left side but doesn't remember much else. C/o pain in left hip and left ankle. EXAM: LEFT FEMUR 2 VIEWS COMPARISON:  Plain film of the left hip dated 12/01/2015 and intraoperative fluoroscopic images dated 12/02/2015. FINDINGS: Patient is status post recent internal fixation of a previous left femoral neck fracture. The fixation hardware appears intact an appropriately aligned. Surgical staples are still present within the soft tissues lateral to the left hip. No new osseous abnormality. Left femoral head is well positioned relative to the acetabulum. IMPRESSION: Status post recent internal fixation of a left femoral neck fracture. No new osseous abnormality appreciated. Hardware appears intact and appropriately positioned. Electronically Signed   By: Franki Cabot M.D.   On: 12/14/2015 15:31      Follow-up Information    Carlyle Basques, MD. Schedule an appointment as soon as  possible for a visit.   Specialty:  Infectious Diseases Why:  NEEDS RETURN OFFICE VISIT 3 WEEKS.  CALL TO SCHEDULE APPOINTMENT.  Contact information: [place] [place] [address] [phone]        [name], MD. Schedule an appointment as soon as possible for a visit.   Specialty:  Orthopedic Surgery Why:  NEEDS RETURN OFFICE VISIT ONE WEEKS FOR WOUND CHECK.  CALL TO SCHEDULE APPOINTMENT. NOTIFY us IMMEDIATELY  IF ANY QUESTIONS OR CONCERNS.  Contact information: [place] [address] [phone]        [name], MD Follow up.   Specialty:  Orthopedic Surgery Contact information: [place] [address] [phone]        MD AT SNF Follow up on 01/07/2016.   Why:  PT/INR check 01/07/2016, and coumadin dosed per results. Goal INR 2-3.          Discharge Plan:  discharge to [facility]      Signed: Benjiman Core for Dr Rodell Perna [facility] orthopedics [phone] 01/06/2016, 2:49 PM

## 2016-01-06 NOTE — Progress Notes (Signed)
Pharmacy Antibiotic Note  [name] [name] is a 75 y.o. female admitted on 01/02/2016 with post-op wound infection growing Proteus mirabilis and MRSA, deep tissue, sparing joint.  Pharmacy has been consulted to resume vancomycin - previously on from 11/2-11/4. Also resumed on ceftriaxone per MD. Afebrile, WBC wnl. SCr improving 1.15, CrCl~52.  Plan: Vancomycin 750mg  IV q12h Ceftriaxone 2g IV q24h per MD Monitor clinical progress, c/s, renal function, abx plan/LOT VT@SS  as indicated Treat x 4 weeks per ID  Temp (24hrs), Avg:98 F (36.7 C), Min:97.7 F (36.5 C), Max:98.4 F (36.9 C)   Recent Labs Lab 01/02/16 1705 01/03/16 0215 01/04/16 0320 01/05/16 0630 01/06/16 0500  WBC 18.9* 16.3* 11.2* 8.6 8.6  CREATININE 1.94* 1.87* 1.82* 1.34* 1.15*    Estimated Creatinine Clearance: 52.4 mL/min (by C-G formula based on SCr of 1.15 mg/dL (H)).    Allergies  Allergen Reactions  . Procaine Hcl Anaphylaxis      Elicia Lamp, PharmD, BCPS Clinical Pharmacist 01/06/2016 9:36 AM

## 2016-01-06 NOTE — Progress Notes (Signed)
Pharmacy Antibiotic Note  [name] [name] is a 75 y.o. female admitted on 01/02/2016 with post-op wound infection w/ Proteus mirabilis.  Pharmacy has been consulted to confirm Ancef dosing for [facility] administration x4wk.  Plan: Currently on Ancef 2g IV Q8H; this is appropriate for wound infection without bone involvement; renal function right at borderline, could consider increasing frequency though likely not necessary since SCr is at about baseline.  Temp (24hrs), Avg:98 F (36.7 C), Min:97.7 F (36.5 C), Max:98.4 F (36.9 C)   Recent Labs Lab 01/02/16 1705 01/03/16 0215 01/04/16 0320 01/05/16 0630 01/06/16 0500  WBC 18.9* 16.3* 11.2* 8.6 8.6  CREATININE 1.94* 1.87* 1.82* 1.34* 1.15*    Estimated Creatinine Clearance: 52.4 mL/min (by C-G formula based on SCr of 1.15 mg/dL (H)).    Allergies  Allergen Reactions  . Procaine Hcl Anaphylaxis      Thank you for allowing pharmacy to be a part of this patient's care.  Wynona Neat, PharmD, BCPS  01/06/2016 7:08 AM

## 2016-01-06 NOTE — Progress Notes (Signed)
Subjective: Patient states that she is feeling much better.  Left hip pain greatly improved.  Ready for transfer back to [facility].  PICC line in.  Denies fatigue, weakness, dizziness with standing/ambulation.    Objective: Vital signs in last 24 hours: Temp:  [97.7 F (36.5 C)-98.4 F (36.9 C)] 98.4 F (36.9 C) (11/06 0555) Pulse Rate:  [70-80] 71 (11/06 0555) Resp:  [16] 16 (11/06 0555) BP: (122-123)/(41-73) 123/41 (11/06 0555) SpO2:  [98 %-100 %] 98 % (11/06 0555)  Intake/Output from previous day: 11/05 0701 - 11/06 0700 In: 370 [P.O.:50; I.V.:220; IV Piggyback:100] Out: -  Intake/Output this shift: No intake/output data recorded.   Recent Labs  01/04/16 0320 01/05/16 0630 01/06/16 0500  HGB 6.3* 9.2* 8.1*    Recent Labs  01/05/16 0630 01/06/16 0500  WBC 8.6 8.6  RBC 3.15* 2.76*  HCT 27.7* 24.5*  PLT 318 333    Recent Labs  01/05/16 0630 01/06/16 0500  NA 137 138  K 3.8 3.8  CL 106 105  CO2 23 25  BUN 31* 20  CREATININE 1.34* 1.15*  GLUCOSE 87 140*  CALCIUM 8.0* 8.0*    Recent Labs  01/05/16 0630 01/06/16 0500  INR 4.40* 3.84    Exam:  Pleasant WF alert and oriented in NAD.  Hip wound looks good.  Some bleeding through dressing.  This will be changed.  Sutures intact. No purulent drainage.  Some slight bleeding.  Left thigh swollen and nontender.  Calf nontender.  NVI.    Assessment/Plan: Asymptomatic anemia.  Will recheck hemoglobin.  Has PICC line.  IV ampicillin x 28 days per ID.  Appreciate medicine and ID help.  [facility] back [facility] when medically stable.     Benjiman Core 01/06/2016, 7:47 AM

## 2016-01-07 LAB — TYPE AND SCREEN
ABO/RH(D): A NEG
ANTIBODY SCREEN: NEGATIVE
UNIT DIVISION: 0
UNIT DIVISION: 0
Unit division: 0

## 2016-01-08 ENCOUNTER — Telehealth (INDEPENDENT_AMBULATORY_CARE_PROVIDER_SITE_OTHER): Payer: Self-pay | Admitting: Orthopaedic Surgery

## 2016-01-08 LAB — CULTURE, BLOOD (ROUTINE X 2)
CULTURE: NO GROWTH
Culture: NO GROWTH

## 2016-01-08 NOTE — Telephone Encounter (Signed)
Schedule appt?

## 2016-01-08 NOTE — Telephone Encounter (Signed)
[name] [name] with [facility] called advised patient need a 1 week post op visit with Dr Lorin Mercy.  The number to contact him is [phone]  Please leave appt. time and date with Receptionist at the front desk if he does not answer phone.

## 2016-01-08 NOTE — Telephone Encounter (Signed)
IC Dominica Severin back and advised appt worked in for Friday 01/10/16 at 215pm. LM with Cecille Rubin to tell him

## 2016-01-10 ENCOUNTER — Encounter (INDEPENDENT_AMBULATORY_CARE_PROVIDER_SITE_OTHER): Payer: Self-pay | Admitting: Orthopaedic Surgery

## 2016-01-10 ENCOUNTER — Ambulatory Visit (INDEPENDENT_AMBULATORY_CARE_PROVIDER_SITE_OTHER): Payer: Medicare Other | Admitting: Orthopaedic Surgery

## 2016-01-10 VITALS — BP 96/63 | HR 86

## 2016-01-10 DIAGNOSIS — L089 Local infection of the skin and subcutaneous tissue, unspecified: Secondary | ICD-10-CM

## 2016-01-10 DIAGNOSIS — T148XXA Other injury of unspecified body region, initial encounter: Secondary | ICD-10-CM

## 2016-01-10 NOTE — Progress Notes (Signed)
Office Visit Note   Patient: [name] [name]           Date of Birth: 1940/12/09           MRN: [id number] Visit Date: 01/10/2016              Requested by: Colon Branch, MD [address] [place], [place] [address] PCP: [name], MD   Assessment & Plan: Visit Diagnoses:  1. Wound infection   left hip s/p I&D  Plan:  Patient will continue IV antibiotics. Follow-up visit scheduled 01/20/2016. We'll keep follow-up appointment with Dr. Megan Salon infectious disease in about 3 weeks. Continue PT protocol weightbearing as tolerated left lower extremity..   Follow-Up Instructions: Return for follow up as scheduled 22 January 2016.   Orders:  No orders of the defined types were placed in this encounter.  No orders of the defined types were placed in this encounter.     Procedures: No procedures performed   Clinical Data: No additional findings.   Subjective: Chief Complaint  Patient presents with  . Left Hip - Routine Post Op    Mrs. [name] [name] is here for a post op appointment.  12/02/15 she had  Left troch nail, 01/03/16 she had Irrigation & Debridement of the Left hip incision.  She is doing much better today she is able to walk without any pain now.      Review of Systems  Constitutional: Negative.   HENT: Negative.   Respiratory: Negative.   Cardiovascular: Negative.   Gastrointestinal: Negative.   Genitourinary: Negative.   Musculoskeletal: Negative.   Skin: Negative.   Neurological: Negative.   Hematological: Negative.   Psychiatric/Behavioral: Negative.      Objective: Vital Signs: BP 96/63 (BP Location: Left Arm, Patient Position: Sitting)   Pulse 86   Physical Exam  Constitutional: She is oriented to person, place, and time. No distress.  Eyes: Pupils are equal, round, and reactive to light.  Pulmonary/Chest: Effort normal.  Abdominal: She exhibits no distension.  Neurological: She is alert and oriented to person, place, and time.     Ortho Exam Left hip wound looks good. She does have a some serous sinus drainage. Nothing purulent. Sutures are intact. Nontender. Specialty Comments:  No specialty comments available.  Imaging: No results found.   PMFS History: Patient Active Problem List   Diagnosis Date Noted  . Decreased strength of lower extremity   . Gram-negative infection   . CKD (chronic kidney disease) stage 3, GFR 30-59 ml/min   . Postoperative anemia due to acute blood loss 01/04/2016  . Left hip postoperative wound infection 01/02/2016  . Closed intertrochanteric fracture of hip, left, sequela 12/25/2015  . Hip fracture ([place]) 12/01/2015  . Anemia 12/01/2015  . Leukocytosis 12/01/2015  . Ankle fracture, left 09/17/2015  . Ankle fracture, bimalleolar, closed   . Bilateral fibular fractures 09/14/2015  . Hypertensive heart disease 05/20/2015  . CAD (coronary artery disease) 05/20/2015  . Ischemic cardiomyopathy 05/20/2015  . Acute systolic CHF (congestive heart failure) ([place]) 05/20/2015  . PAF (paroxysmal atrial fibrillation) ([place]) 05/20/2015  . Anticoagulated on Coumadin 05/20/2015  . Type 2 diabetes mellitus with vascular disease ([place]) 05/17/2015  . NSTEMI (non-ST elevated myocardial infarction) ([place])   . Bacteremia due to Escherichia coli 05/11/2015  . Acute kidney injury superimposed on chronic kidney disease ([place]) 05/11/2015  . Uncontrolled hypertension 05/11/2015  . 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[place]) 96/22/2979  . Atrial fibrillation with RVR ([place]) 05/06/2015  .  PCP NOTES >>>>>>>>>>>>>>>> 12/28/2014  . Depression 05/17/2014  . Memory loss 05/17/2014  . Diabetes mellitus-PCP comments 01/23/2014  . A-fib ([place]) 01/23/2014  . Anxiety and depression, PCP notes  11/24/2013  . Severe obesity (BMI >= 40) ([place]) 07/14/2013  . Encounter for therapeutic drug monitoring 06/02/2013  . Dilantin toxicity 11/21/2012  . History of encephalitis 11/18/2012  . Localization-related (focal) (partial) epilepsy and  epileptic syndromes with complex partial seizures, without mention of intractable epilepsy 09/27/2012  . Hyponatremia 07/16/2012  . Intertrochanteric fracture of right hip ([place]) 07/13/2012  . DM (diabetes mellitus) ([place]) 01/19/2012  . GERD (gastroesophageal reflux disease) 10/05/2011  . Shoulder pain 08/27/2011  . Seizure disorder ([place]) 05/24/2011  . UTI (lower urinary tract infection) 05/23/2011  . UTI (urinary tract infection) 05/22/2011  . Annual physical exam 02/10/2011  . Long term (current) use of anticoagulants 09/23/2010  . Atrial fibrillation ([place])   . 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 Hyperlipidemia 03/27/2006  . Hereditary and idiopathic peripheral neuropathy 03/27/2006  . 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 Essential hypertension 03/27/2006  . OSTEOPENIA 03/27/2006   Past Medical History:  Diagnosis Date  . Atrial fibrillation ([place])    SVT dx 2007, cath 2007 mild CAD, then had an cardioversion, ablation; still on coumadin , has occ palpitation, EKG 03-2010 NSR  . Blindness of left eye   . DIABETES MELLITUS, TYPE II 03/27/2006   dr [name]  . Eye muscle weakness    Right eye weakness after cataract surgery  . GERD (gastroesophageal reflux disease) 10/05/2011  . 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 HYPERLIPIDEMIA 03/27/2006  . HYPERTENSION 03/27/2006  . Intertrochanteric fracture of right hip ([place]) 07/13/2012  . 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 Osteopenia 2004   Dexa 2004 showed Osteopenia, DEXA 03/2007 normal  . Osteopenia   . Other chronic cystitis with hematuria   . Recurrent urinary tract infection    Seeing Urology  . 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 Seizures ([place])   . 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[place]) 8/67/6720    Family History  Problem Relation Age of Onset  . Diabetes Father   . Heart attack Father 8  . Diabetes Sister   . Cancer Neg Hx     no hx of colon or breast cancer    Past Surgical History:  Procedure Laterality Date  . CARDIAC CATHETERIZATION  N/A 05/17/2015   Procedure: Left Heart Cath and Coronary Angiography;  Surgeon: Jettie Booze, MD;  Location: [facility];  Service: Cardiovascular;  Laterality: N/A;  . CARDIAC CATHETERIZATION N/A 05/17/2015   Procedure: Coronary Balloon Angioplasty;  Surgeon: Jettie Booze, MD;  Location: [facility];  Service: Cardiovascular;  Laterality: N/A;  . FEMUR IM NAIL Right 07/15/2012   Procedure: INTRAMEDULLARY (IM) NAIL HIP;  Surgeon: Johnny Bridge, MD;  Location: [facility];  Service: Orthopedics;  Laterality: Right;  . FEMUR IM NAIL Left 12/02/2015   Procedure: INTRAMEDULLARY (IM) NAIL FEMORAL;  Surgeon: [name], MD;  Location: [facility];  Service: Orthopedics;  Laterality: Left;  . INCISION AND DRAINAGE HIP Left 01/03/2016   Procedure: IRRIGATION AND DEBRIDEMENT HIP;  Surgeon: [name], MD;  Location: [facility];  Service: Orthopedics;  Laterality: Left;  . SHOULDER SURGERY     Social History   Occupational History  . retired  Retired   Social History Main Topics  . Smoking status: Former Smoker    Types: Cigarettes    Quit date: 03/02/1978  .  Smokeless tobacco: Never Used  . Alcohol use No  . Drug use: No  . Sexual activity: No

## 2016-01-13 ENCOUNTER — Telehealth (INDEPENDENT_AMBULATORY_CARE_PROVIDER_SITE_OTHER): Payer: Self-pay | Admitting: *Deleted

## 2016-01-13 NOTE — Telephone Encounter (Signed)
OK to do thanks 

## 2016-01-13 NOTE — Telephone Encounter (Signed)
Ok for note 

## 2016-01-13 NOTE — Telephone Encounter (Signed)
Pt husband called stating he needs a letter stating that pt needs stair chair, this letter is for tax purposes. Call back number is [phone]

## 2016-01-15 ENCOUNTER — Encounter (INDEPENDENT_AMBULATORY_CARE_PROVIDER_SITE_OTHER): Payer: Self-pay | Admitting: Radiology

## 2016-01-15 NOTE — Telephone Encounter (Signed)
Note done and placed up front for pick up. Patient's husband is aware.

## 2016-01-22 ENCOUNTER — Encounter (INDEPENDENT_AMBULATORY_CARE_PROVIDER_SITE_OTHER): Payer: Self-pay | Admitting: Orthopaedic Surgery

## 2016-01-22 ENCOUNTER — Ambulatory Visit (INDEPENDENT_AMBULATORY_CARE_PROVIDER_SITE_OTHER): Payer: Self-pay

## 2016-01-22 ENCOUNTER — Ambulatory Visit (INDEPENDENT_AMBULATORY_CARE_PROVIDER_SITE_OTHER): Payer: Medicare Other | Admitting: Orthopaedic Surgery

## 2016-01-22 DIAGNOSIS — M25552 Pain in left hip: Secondary | ICD-10-CM

## 2016-01-22 NOTE — Progress Notes (Signed)
Office Visit Note   Patient: [name] [name]           Date of Birth: Mar 10, 1940           MRN: [id number] Visit Date: 01/22/2016              Requested by: Colon Branch, MD [address] [place], [place] [address] PCP: [name], MD   Assessment & Plan: Visit Diagnoses:  1. Pain in left hip   Status post left hip I&D  Plan: Patient follow up in the office in 1 week for wound check and possible suture removal. She is making great progress with PT. Keep appointment scheduled next week with infectious disease.  Follow-Up Instructions: Return in about 1 week (around 01/29/2016).   Orders:  Orders Placed This Encounter  Procedures  . XR HIP UNILAT W OR W/O PELVIS 2-3 VIEWS LEFT   No orders of the defined types were placed in this encounter.     Procedures: No procedures performed   Clinical Data: No additional findings.   Subjective: Chief Complaint  Patient presents with  . Left Hip - Routine Post Op    Left troch nail 12/02/15 7wks 2 days po I&D L Hip Incision 01/03/16 2wk 5 days po    Patient presents today for follow up left hip. She is status post left trochanteric nail of hip and I&D left hip incision. She is 2 weeks 5 days out from I&D surgery. She is residing at [facility]. They are doing therapy with her daily. She is on IV vancomycin. Her husband has several questions about this. She has been having problems since getting the IV. She feels she is bruising, bleeding more easily.     Review of Systems  Constitutional: Negative.   Respiratory: Negative for cough, chest tightness and shortness of breath.   Cardiovascular: Positive for leg swelling. Negative for chest pain.     Objective: Vital Signs: There were no vitals taken for this visit.  Physical Exam  Constitutional: She is oriented to person, place, and time. No distress.  HENT:  Head: Normocephalic.  Eyes: EOM are normal. Pupils are equal, round, and reactive to light.    Neck: Normal range of motion.  Pulmonary/Chest: No respiratory distress.  Musculoskeletal:  She has 3+ pretibial bilateral pitting edema. Bilateral calves are nontender. Left hip wound sutures are intact. No gross signs of infection. Minimal bleeding from the incision.  Neurological: She is alert and oriented to person, place, and time.    Ortho Exam  Specialty Comments:  No specialty comments available.  Imaging: No results found.   PMFS History: Patient Active Problem List   Diagnosis Date Noted  . Decreased strength of lower extremity   . Gram-negative infection   . CKD (chronic kidney disease) stage 3, GFR 30-59 ml/min   . Postoperative anemia due to acute blood loss 01/04/2016  . Left hip postoperative wound infection 01/02/2016  . Closed intertrochanteric fracture of hip, left, sequela 12/25/2015  . Hip fracture ([place]) 12/01/2015  . Anemia 12/01/2015  . Leukocytosis 12/01/2015  . Ankle fracture, left 09/17/2015  . Ankle fracture, bimalleolar, closed   . Bilateral fibular fractures 09/14/2015  . Hypertensive heart disease 05/20/2015  . CAD (coronary artery disease) 05/20/2015  . Ischemic cardiomyopathy 05/20/2015  . Acute systolic CHF (congestive heart failure) ([place]) 05/20/2015  . PAF (paroxysmal atrial fibrillation) ([place]) 05/20/2015  . Anticoagulated on Coumadin 05/20/2015  . Type 2 diabetes  mellitus with vascular disease ([place]) 05/17/2015  . NSTEMI (non-ST elevated myocardial infarction) ([place])   . Bacteremia due to Escherichia coli 05/11/2015  . Acute kidney injury superimposed on chronic kidney disease ([place]) 05/11/2015  . Uncontrolled hypertension 05/11/2015  . 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[place]) 81/19/1478  . Atrial fibrillation with RVR ([place]) 05/06/2015  . PCP NOTES >>>>>>>>>>>>>>>> 12/28/2014  . Depression 05/17/2014  . Memory loss 05/17/2014  . Diabetes mellitus-PCP comments 01/23/2014  . A-fib ([place]) 01/23/2014  . Anxiety and depression, PCP notes  11/24/2013  . Severe  obesity (BMI >= 40) ([place]) 07/14/2013  . Encounter for therapeutic drug monitoring 06/02/2013  . Dilantin toxicity 11/21/2012  . History of encephalitis 11/18/2012  . Localization-related (focal) (partial) epilepsy and epileptic syndromes with complex partial seizures, without mention of intractable epilepsy 09/27/2012  . Hyponatremia 07/16/2012  . Intertrochanteric fracture of right hip ([place]) 07/13/2012  . DM (diabetes mellitus) ([place]) 01/19/2012  . GERD (gastroesophageal reflux disease) 10/05/2011  . Shoulder pain 08/27/2011  . Seizure disorder ([place]) 05/24/2011  . UTI (lower urinary tract infection) 05/23/2011  . UTI (urinary tract infection) 05/22/2011  . Annual physical exam 02/10/2011  . Long term (current) use of anticoagulants 09/23/2010  . Atrial fibrillation ([place])   . 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 Hyperlipidemia 03/27/2006  . Hereditary and idiopathic peripheral neuropathy 03/27/2006  . 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 Essential hypertension 03/27/2006  . OSTEOPENIA 03/27/2006   Past Medical History:  Diagnosis Date  . Atrial fibrillation ([place])    SVT dx 2007, cath 2007 mild CAD, then had an cardioversion, ablation; still on coumadin , has occ palpitation, EKG 03-2010 NSR  . Blindness of left eye   . DIABETES MELLITUS, TYPE II 03/27/2006   dr [name]  . Eye muscle weakness    Right eye weakness after cataract surgery  . GERD (gastroesophageal reflux disease) 10/05/2011  . 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 HYPERLIPIDEMIA 03/27/2006  . HYPERTENSION 03/27/2006  . Intertrochanteric fracture of right hip ([place]) 07/13/2012  . 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 Osteopenia 2004   Dexa 2004 showed Osteopenia, DEXA 03/2007 normal  . Osteopenia   . Other chronic cystitis with hematuria   . Recurrent urinary tract infection    Seeing Urology  . 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 Seizures ([place])   . 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[place]) 2/95/6213    Family History  Problem Relation  Age of Onset  . Diabetes Father   . Heart attack Father 43  . Diabetes Sister   . Cancer Neg Hx     no hx of colon or breast cancer    Past Surgical History:  Procedure Laterality Date  . CARDIAC CATHETERIZATION N/A 05/17/2015   Procedure: Left Heart Cath and Coronary Angiography;  Surgeon: Jettie Booze, MD;  Location: [facility];  Service: Cardiovascular;  Laterality: N/A;  . CARDIAC CATHETERIZATION N/A 05/17/2015   Procedure: Coronary Balloon Angioplasty;  Surgeon: Jettie Booze, MD;  Location: [facility];  Service: Cardiovascular;  Laterality: N/A;  . FEMUR IM NAIL Right 07/15/2012   Procedure: INTRAMEDULLARY (IM) NAIL HIP;  Surgeon: Johnny Bridge, MD;  Location: [facility];  Service: Orthopedics;  Laterality: Right;  . FEMUR IM NAIL Left 12/02/2015   Procedure: INTRAMEDULLARY (IM) NAIL FEMORAL;  Surgeon: [name], MD;  Location: [facility];  Service: Orthopedics;  Laterality: Left;  . INCISION AND DRAINAGE HIP Left 01/03/2016   Procedure: IRRIGATION AND DEBRIDEMENT HIP;  Surgeon: Elta Guadeloupe  Jule Economy, MD;  Location: [facility];  Service: Orthopedics;  Laterality: Left;  . SHOULDER SURGERY     Social History   Occupational History  . retired  Retired   Social History Main Topics  . Smoking status: Former Smoker    Types: Cigarettes    Quit date: 03/02/1978  . Smokeless tobacco: Never Used  . Alcohol use No  . Drug use: No  . Sexual activity: No

## 2016-01-29 ENCOUNTER — Ambulatory Visit (INDEPENDENT_AMBULATORY_CARE_PROVIDER_SITE_OTHER): Payer: Medicare Other | Admitting: Internal Medicine

## 2016-01-29 ENCOUNTER — Encounter: Payer: Self-pay | Admitting: Internal Medicine

## 2016-01-29 ENCOUNTER — Encounter (INDEPENDENT_AMBULATORY_CARE_PROVIDER_SITE_OTHER): Payer: Self-pay | Admitting: Orthopaedic Surgery

## 2016-01-29 ENCOUNTER — Ambulatory Visit (INDEPENDENT_AMBULATORY_CARE_PROVIDER_SITE_OTHER): Payer: Medicare Other | Admitting: Orthopaedic Surgery

## 2016-01-29 DIAGNOSIS — IMO0001 Reserved for inherently not codable concepts without codable children: Secondary | ICD-10-CM

## 2016-01-29 DIAGNOSIS — S72141G Displaced intertrochanteric fracture of right femur, subsequent encounter for closed fracture with delayed healing: Secondary | ICD-10-CM

## 2016-01-29 DIAGNOSIS — T814XXD Infection following a procedure, subsequent encounter: Secondary | ICD-10-CM

## 2016-01-29 DIAGNOSIS — I255 Ischemic cardiomyopathy: Secondary | ICD-10-CM

## 2016-01-29 DIAGNOSIS — B999 Unspecified infectious disease: Secondary | ICD-10-CM | POA: Diagnosis not present

## 2016-01-29 MED ORDER — AMOXICILLIN 500 MG PO CAPS: 500.0000 mg | ORAL_CAPSULE | Freq: Two times a day (BID) | ORAL | 0 refills | Status: DC

## 2016-01-29 NOTE — Progress Notes (Signed)
Post-Op Visit Note   Patient: [name] [name]           Date of Birth: April 25, 1940           MRN: [id number] Visit Date: 01/29/2016 PCP: [name], MD   Assessment & Plan:  Chief Complaint:  Chief Complaint  Patient presents with  . Left Hip - Routine Post Op   Visit Diagnoses:  1. Closed displaced intertrochanteric fracture of right femur with delayed healing, subsequent encounter   2. Postoperative wound infection of left hip, subsequent encounter     Plan:Patient has a point with Dr. Megan Salon infectious disease today. She's having some problems with rash likely related to her antibiotics. Sutures were harvested today. Midportion of her incision has a small area of drainage but sizable quarter which was applied early this morning. She has some bioabsorbable antibiotic beads in her hip in the subcutaneous tissue and I discussed with her that this may continue to drain some for a period of time. There is no cellulitis. I will recheck her again in 4 weeks. Antibiotics will be per Dr. Megan Salon.  Follow-Up Instructions: No Follow-up on file.   Orders:  No orders of the defined types were placed in this encounter.  No orders of the defined types were placed in this encounter.  Patient returns for wound check and possible suture removal. She states that her hip is not bothering her. She has been having daily dressing changes. She does have an appointment with infectious disease later this afternoon.   PMFS History: Patient Active Problem List   Diagnosis Date Noted  . Decreased strength of lower extremity   . Gram-negative infection   . CKD (chronic kidney disease) stage 3, GFR 30-59 ml/min   . Postoperative anemia due to acute blood loss 01/04/2016  . Left hip postoperative wound infection 01/02/2016  . Closed intertrochanteric fracture of hip, left, sequela 12/25/2015  . Anemia 12/01/2015  . Ankle fracture, left 09/17/2015  . Ankle fracture, bimalleolar, closed   .  Bilateral fibular fractures 09/14/2015  . Hypertensive heart disease 05/20/2015  . CAD (coronary artery disease) 05/20/2015  . Acute systolic CHF (congestive heart failure) ([place]) 05/20/2015  . Anticoagulated on Coumadin 05/20/2015  . Type 2 diabetes mellitus with vascular disease ([place]) 05/17/2015  . NSTEMI (non-ST elevated myocardial infarction) ([place])   . Bacteremia due to Escherichia coli 05/11/2015  . Acute kidney injury superimposed on chronic kidney disease ([place]) 05/11/2015  . Uncontrolled hypertension 05/11/2015  . 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[place]) 75/12/2583  . PCP NOTES >>>>>>>>>>>>>>>> 12/28/2014  . Depression 05/17/2014  . Memory loss 05/17/2014  . Anxiety and depression, PCP notes  11/24/2013  . Severe obesity (BMI >= 40) ([place]) 07/14/2013  . Encounter for therapeutic drug monitoring 06/02/2013  . Dilantin toxicity 11/21/2012  . History of encephalitis 11/18/2012  . Localization-related (focal) (partial) epilepsy and epileptic syndromes with complex partial seizures, without mention of intractable epilepsy 09/27/2012  . Intertrochanteric fracture of right hip ([place]) 07/13/2012  . DM (diabetes mellitus) ([place]) 01/19/2012  . GERD (gastroesophageal reflux disease) 10/05/2011  . Shoulder pain 08/27/2011  . Seizure disorder ([place]) 05/24/2011  . Annual physical exam 02/10/2011  . Atrial fibrillation ([place])   . 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 Hyperlipidemia 03/27/2006  . Hereditary and idiopathic peripheral neuropathy 03/27/2006  . 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 Essential hypertension 03/27/2006  . OSTEOPENIA 03/27/2006   Past Medical History:  Diagnosis Date  . Atrial fibrillation ([place])    SVT dx 2007,  cath 2007 mild CAD, then had an cardioversion, ablation; still on coumadin , has occ palpitation, EKG 03-2010 NSR  . Blindness of left eye   . DIABETES MELLITUS, TYPE II 03/27/2006   dr [name]  . Eye muscle weakness    Right eye weakness after cataract surgery  . GERD (gastroesophageal  reflux disease) 10/05/2011  . 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 HYPERLIPIDEMIA 03/27/2006  . HYPERTENSION 03/27/2006  . Intertrochanteric fracture of right hip ([place]) 07/13/2012  . 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 Osteopenia 2004   Dexa 2004 showed Osteopenia, DEXA 03/2007 normal  . Osteopenia   . Other chronic cystitis with hematuria   . Recurrent urinary tract infection    Seeing Urology  . 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 Seizures ([place])   . 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[place]) 3/47/4259    Family History  Problem Relation Age of Onset  . Diabetes Father   . Heart attack Father 54  . Diabetes Sister   . Cancer Neg Hx     no hx of colon or breast cancer    Past Surgical History:  Procedure Laterality Date  . CARDIAC CATHETERIZATION N/A 05/17/2015   Procedure: Left Heart Cath and Coronary Angiography;  Surgeon: Jettie Booze, MD;  Location: [facility];  Service: Cardiovascular;  Laterality: N/A;  . CARDIAC CATHETERIZATION N/A 05/17/2015   Procedure: Coronary Balloon Angioplasty;  Surgeon: Jettie Booze, MD;  Location: [facility];  Service: Cardiovascular;  Laterality: N/A;  . FEMUR IM NAIL Right 07/15/2012   Procedure: INTRAMEDULLARY (IM) NAIL HIP;  Surgeon: Johnny Bridge, MD;  Location: [facility];  Service: Orthopedics;  Laterality: Right;  . FEMUR IM NAIL Left 12/02/2015   Procedure: INTRAMEDULLARY (IM) NAIL FEMORAL;  Surgeon: [name], MD;  Location: [facility];  Service: Orthopedics;  Laterality: Left;  . INCISION AND DRAINAGE HIP Left 01/03/2016   Procedure: IRRIGATION AND DEBRIDEMENT HIP;  Surgeon: [name], MD;  Location: [facility];  Service: Orthopedics;  Laterality: Left;  . SHOULDER SURGERY     Social History   Occupational History  . retired  Retired   Social History Main Topics  . Smoking status: Former Smoker    Types: Cigarettes    Quit date: 03/02/1978  . Smokeless tobacco: Never Used  . Alcohol use No  . Drug use: No  . Sexual activity: No

## 2016-01-29 NOTE — Progress Notes (Signed)
RN received verbal order to discontinue the patient's PICC line.  Patient identified with name and date of birth. PICC dressing removed, site unremarkable.  Sutures removed without difficulty.  PICC line removed using sterile procedure @ 1545. PICC length equal to that noted in patient's hospital chart of 40 cm. Sterile petroleum gauze + sterile 4X4 applied to PICC site, pressure applied for 10 minutes and covered with Medipore tape as a pressure dressing. Patient tolerated procedure without complaints.  Patient instructed to limit use of arm for 1 hour. Patient instructed that the pressure dressing should remain in place for 24 hours. Patient verbalized understanding of these instructions

## 2016-01-29 NOTE — Assessment & Plan Note (Signed)
It is quite possible that her postoperative wound infection has been cured following incision and drainage in 4 weeks of antibiotics. However she still has an open wound. I discussed treatment options with her. She may be having an allergic reaction to vancomycin or ceftriaxone. I would like to extend antibiotic therapy with 2 more weeks of oral treatment. She is not a candidate for trimethoprim sulfamethoxazole because she is on warfarin. I will treat her with amoxicillin and see her back in 2 weeks. We will have her PICC removed.

## 2016-01-29 NOTE — Progress Notes (Signed)
[facility] for Infectious Disease  Patient Active Problem List   Diagnosis Date Noted  . Gram-negative infection     Priority: High  . Left hip postoperative wound infection 01/02/2016    Priority: High  . Closed intertrochanteric fracture of hip, left, sequela 12/25/2015    Priority: Medium  . Decreased strength of lower extremity   . CKD (chronic kidney disease) stage 3, GFR 30-59 ml/min   . Postoperative anemia due to acute blood loss 01/04/2016  . Anemia 12/01/2015  . Ankle fracture, left 09/17/2015  . Ankle fracture, bimalleolar, closed   . Bilateral fibular fractures 09/14/2015  . Hypertensive heart disease 05/20/2015  . CAD (coronary artery disease) 05/20/2015  . Acute systolic CHF (congestive heart failure) ([place]) 05/20/2015  . Anticoagulated on Coumadin 05/20/2015  . Type 2 diabetes mellitus with vascular disease ([place]) 05/17/2015  . NSTEMI (non-ST elevated myocardial infarction) ([place])   . Bacteremia due to Escherichia coli 05/11/2015  . Acute kidney injury superimposed on chronic kidney disease ([place]) 05/11/2015  . Uncontrolled hypertension 05/11/2015  . 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[place]) 60/11/9321  . PCP NOTES >>>>>>>>>>>>>>>> 12/28/2014  . Depression 05/17/2014  . Memory loss 05/17/2014  . Anxiety and depression, PCP notes  11/24/2013  . Severe obesity (BMI >= 40) ([place]) 07/14/2013  . Encounter for therapeutic drug monitoring 06/02/2013  . Dilantin toxicity 11/21/2012  . History of encephalitis 11/18/2012  . Localization-related (focal) (partial) epilepsy and epileptic syndromes with complex partial seizures, without mention of intractable epilepsy 09/27/2012  . Intertrochanteric fracture of right hip ([place]) 07/13/2012  . DM (diabetes mellitus) ([place]) 01/19/2012  . GERD (gastroesophageal reflux disease) 10/05/2011  . Shoulder pain 08/27/2011  . Seizure disorder ([place]) 05/24/2011  . Annual physical exam 02/10/2011  . Atrial fibrillation ([place])   . LUNG NODULE  09/01/2006  . Hyperlipidemia 03/27/2006  . Hereditary and idiopathic peripheral neuropathy 03/27/2006  . 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 Essential hypertension 03/27/2006  . OSTEOPENIA 03/27/2006    Patient's Medications  New Prescriptions   AMOXICILLIN (AMOXIL) 500 MG CAPSULE    Take 1 capsule (500 mg total) by mouth 2 (two) times daily.  Previous Medications   ATORVASTATIN (LIPITOR) 80 MG TABLET    Take 80 mg by mouth daily.    CALCIUM-VITAMIN D (OSCAL WITH D) 500-200 MG-UNIT PER TABLET    Take 1 tablet by mouth daily.   CLOPIDOGREL (PLAVIX) 75 MG TABLET    Take 1 tablet (75 mg total) by mouth daily.   ESCITALOPRAM (LEXAPRO) 10 MG TABLET    TAKE TWO TABLETS BY MOUTH ONCE DAILY   FERROUS SULFATE 325 (65 FE) MG EC TABLET    Take 325 mg by mouth daily with breakfast.   FUROSEMIDE (LASIX) 40 MG TABLET    Take 1 tablet (40 mg total) by mouth 2 (two) times daily.   INSULIN ASPART (NOVOLOG [place])    Inject into the skin.   INSULIN NPH-REGULAR HUMAN (NOVOLIN 70/30) (70-30) 100 UNIT/ML INJECTION    Inject 14-30 Units into the skin See admin instructions. Inject 30 units in the morning. 14 units-2 times daily with lunch and dinnner   LEVETIRACETAM (KEPPRA XR) 500 MG 24 HR TABLET    Take 1 tablet (500 mg total) by mouth daily.   LISINOPRIL (PRINIVIL,ZESTRIL) 5 MG TABLET    Take 1 tablet (5 mg total) by mouth daily.   LORAZEPAM (ATIVAN) 0.5 MG TABLET    Take 1 tablet (0.5 mg total) by  mouth 2 (two) times daily as needed for anxiety.   METHOCARBAMOL (ROBAXIN) 500 MG TABLET    Take 1 tablet (500 mg total) by mouth every 6 (six) hours as needed for muscle spasms.   METOPROLOL SUCCINATE (TOPROL-XL) 50 MG 24 HR TABLET    Take 25 mg by mouth daily. Take with or immediately following a meal.   OXYCODONE-ACETAMINOPHEN (ROXICET) 5-325 MG TABLET    Take 1 tablet by mouth every 6 (six) hours as needed for severe pain.   PANTOPRAZOLE (PROTONIX) 40 MG TABLET    Take 1 tablet (40 mg total) by mouth daily.     PHENYTOIN (DILANTIN) 200 MG ER CAPSULE    Take 1 capsule (200 mg total) by mouth 2 (two) times daily. Reduce to 1 tablet daily x 2 months then every other day x 2 months and stop   PROMETHAZINE (PHENERGAN) 12.5 MG TABLET    Take 1 tablet (12.5 mg total) by mouth every 8 (eight) hours as needed for nausea or vomiting.   PROTEIN SUPPLEMENT (PROSOURCE NO CARB) LIQD    Take 30 mLs by mouth 2 (two) times daily.   SACCHAROMYCES BOULARDII (FLORASTOR) 250 MG CAPSULE    Take 1 capsule (250 mg total) by mouth 2 (two) times daily.   WARFARIN (COUMADIN) 5 MG TABLET    Take 1 tablet (5 mg total) by mouth daily.  Modified Medications   No medications on file  Discontinued Medications   CEFTRIAXONE 2 G IN DEXTROSE 5 % 50 ML    Inject 2 g into the vein daily.   VANCOMYCIN (VANCOCIN) 750-5 MG/150ML-% SOLN    Inject 150 mLs (750 mg total) into the vein every 12 (twelve) hours.    Subjective: Ms. Laske is in for her hospital follow-up visit. She suffered a left hip fracture after a fall on 12/01/2015. She underwent intramedullary nailing on 12/02/2015. She was seen back in the emergency department on 12/14/2015 with increased left hip pain that was felt to be due to a nonocclusive clot in her left leg. She continued to have pain and developed some redness and drainage from her wound. She was started on doxycycline but stopped it on 12/29/2015 because of GI upset. She was readmitted on 01/02/2016 because of the increased pain, redness and drainage. A swab culture of wound drainage grew MRSA. She underwent incision and drainage a day later and Dr. Lorin Mercy noted "infected hematoma" not down to bone. Operative cultures grew Proteus. She was seen by 2 by partners who recommended that she be discharged on IV vancomycin and ceftriaxone. She is now completed 28 days of therapy. She has been bothered by pruritus and has been scratching ever since she was discharged. She's had no problems tolerating her PICC. She is no longer  having any left hip pain. Her staples were removed today. Her wound is looking better. She still having some drainage from the midportion of her wound but the open area is getting smaller. She did have antibiotic beads placed at the time of surgery.  Review of Systems: Review of Systems  Constitutional: Positive for malaise/fatigue. Negative for chills, diaphoresis, fever and weight loss.  HENT: Negative for sore throat.   Respiratory: Negative for cough, sputum production and shortness of breath.   Cardiovascular: Negative for chest pain.  Gastrointestinal: Negative for abdominal pain, diarrhea, nausea and vomiting.  Genitourinary: Negative for dysuria and frequency.  Musculoskeletal: Negative for joint pain.  Skin: Positive for itching and rash.  Neurological: Negative for dizziness  and headaches.    Past Medical History:  Diagnosis Date  . Atrial fibrillation ([place])    SVT dx 2007, cath 2007 mild CAD, then had an cardioversion, ablation; still on coumadin , has occ palpitation, EKG 03-2010 NSR  . Blindness of left eye   . DIABETES MELLITUS, TYPE II 03/27/2006   dr [name]  . Eye muscle weakness    Right eye weakness after cataract surgery  . GERD (gastroesophageal reflux disease) 10/05/2011  . 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 HYPERLIPIDEMIA 03/27/2006  . HYPERTENSION 03/27/2006  . Intertrochanteric fracture of right hip ([place]) 07/13/2012  . 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 Osteopenia 2004   Dexa 2004 showed Osteopenia, DEXA 03/2007 normal  . Osteopenia   . Other chronic cystitis with hematuria   . Recurrent urinary tract infection    Seeing Urology  . 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 Seizures ([place])   . 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[place]) 3/97/6734    Social History  Substance Use Topics  . Smoking status: Former Smoker    Types: Cigarettes    Quit date: 03/02/1978  . Smokeless tobacco: Never Used  . Alcohol use No    Family History  Problem Relation Age of Onset  .  Diabetes Father   . Heart attack Father 26  . Diabetes Sister   . Cancer Neg Hx     no hx of colon or breast cancer    Allergies  Allergen Reactions  . Procaine Hcl Anaphylaxis    Objective: Vitals:   01/29/16 1438 01/29/16 1445  BP:  (!) 150/69  Pulse:  63  Temp:  98.7 F (37.1 C)  TempSrc:  Oral  Weight: 204 lb 8 oz (92.8 kg)   Height: 5\' 9"  (1.753 m)    Body mass index is 30.2 kg/m.  Physical Exam  Constitutional: She is oriented to person, place, and time.  She is in no distress. She's completed by her husband.  Cardiovascular: Normal rate and regular rhythm.   No murmur heard. Pulmonary/Chest: Effort normal and breath sounds normal.  Abdominal: Soft. There is tenderness.  Musculoskeletal:  She stands without difficulty. The midportion of her left hip wound is open with a small amount of thin bloody drainage. There is no surrounding cellulitis or fluctuance. There is no odor.  Neurological: She is alert and oriented to person, place, and time.  Skin: Rash noted.  She has excoriations over both breast and some on her left upper back. There are a few excoriations on her left calf.  Her right arm PICC site looks good.  Psychiatric: Mood and affect normal.    Lab Results Sed Rate (mm/hr)  Date Value  01/02/2016 110 (H)   CRP (mg/dL)  Date Value  01/02/2016 21.1 (H)     Problem List Items Addressed This Visit      High   Left hip postoperative wound infection    It is quite possible that her postoperative wound infection has been cured following incision and drainage in 4 weeks of antibiotics. However she still has an open wound. I discussed treatment options with her. She may be having an allergic reaction to vancomycin or ceftriaxone. I would like to extend antibiotic therapy with 2 more weeks of oral treatment. She is not a candidate for trimethoprim sulfamethoxazole because she is on warfarin. I will treat her with amoxicillin and see her back in 2 weeks.  We will have her PICC removed.      Relevant  Medications   amoxicillin (AMOXIL) 500 MG capsule   Other Relevant Orders   C-reactive protein   Sedimentation rate       Michel Bickers, MD [facility] for Infectious [place] [phone] pager   [phone] cell 01/29/2016, 3:19 PM

## 2016-01-29 NOTE — Addendum Note (Signed)
Addended by: Lorne Skeens D on: 01/29/2016 03:57 PM   Modules accepted: Orders

## 2016-01-30 LAB — SEDIMENTATION RATE: SED RATE: 27 mm/h (ref 0–30)

## 2016-01-30 LAB — C-REACTIVE PROTEIN: CRP: 51.9 mg/L — ABNORMAL HIGH (ref <8.0)

## 2016-02-03 ENCOUNTER — Telehealth: Payer: Self-pay | Admitting: Internal Medicine

## 2016-02-03 NOTE — Telephone Encounter (Signed)
That is okay, thank you 

## 2016-02-03 NOTE — Telephone Encounter (Signed)
Okay to give verbals?

## 2016-02-03 NOTE — Telephone Encounter (Signed)
Spoke w/ [place], verbal orders given.  

## 2016-02-03 NOTE — Telephone Encounter (Signed)
Caller name: Constance Haw with [facility] Can be reached: [phone]  Reason for call: doing home PT eval following recent dc from nursing home. Planning to see pt 2x week for 3 weeks, 1x week 1. Please call with VO. Ok to leave on confidential VM.

## 2016-02-06 ENCOUNTER — Telehealth: Payer: Self-pay

## 2016-02-06 ENCOUNTER — Ambulatory Visit (INDEPENDENT_AMBULATORY_CARE_PROVIDER_SITE_OTHER): Payer: Medicare Other | Admitting: Internal Medicine

## 2016-02-06 DIAGNOSIS — Z5181 Encounter for therapeutic drug level monitoring: Secondary | ICD-10-CM

## 2016-02-06 LAB — POCT INR: INR: 6.1

## 2016-02-06 NOTE — Telephone Encounter (Signed)
Home Health Plan of Care completed and faxed to [facility] at [phone]. Plan of care sent for scanning.

## 2016-02-07 ENCOUNTER — Telehealth: Payer: Self-pay | Admitting: Internal Medicine

## 2016-02-07 NOTE — Telephone Encounter (Signed)
Thx

## 2016-02-07 NOTE — Telephone Encounter (Signed)
Received critical INR value 6.1 for patient from home health. Upon review of chart patient sees cardiology for coumadin monitoring and saw them yesterday and they are aware (6.2 yesterday) and this was only confirmative. Will forward to cardiology so she does not get duplicitive instructions but reinforced the instructions given by cardiology yesterday.

## 2016-02-10 ENCOUNTER — Ambulatory Visit (INDEPENDENT_AMBULATORY_CARE_PROVIDER_SITE_OTHER): Payer: Medicare Other | Admitting: Internal Medicine

## 2016-02-10 DIAGNOSIS — Z5181 Encounter for therapeutic drug level monitoring: Secondary | ICD-10-CM

## 2016-02-10 LAB — POCT INR: INR: 1.3

## 2016-02-10 MED ORDER — WARFARIN SODIUM 2.5 MG PO TABS: 2.5000 mg | ORAL_TABLET | Freq: Every day | ORAL | 1 refills | Status: DC

## 2016-02-13 ENCOUNTER — Telehealth: Payer: Self-pay | Admitting: *Deleted

## 2016-02-13 ENCOUNTER — Ambulatory Visit: Payer: Self-pay | Admitting: Internal Medicine

## 2016-02-13 NOTE — Telephone Encounter (Signed)
Called patient and left message to return call

## 2016-02-17 ENCOUNTER — Ambulatory Visit (INDEPENDENT_AMBULATORY_CARE_PROVIDER_SITE_OTHER): Payer: Medicare Other | Admitting: Cardiovascular Disease

## 2016-02-17 DIAGNOSIS — I4891 Unspecified atrial fibrillation: Secondary | ICD-10-CM

## 2016-02-17 DIAGNOSIS — Z5181 Encounter for therapeutic drug level monitoring: Secondary | ICD-10-CM

## 2016-02-17 LAB — POCT INR: INR: 3.2

## 2016-02-18 ENCOUNTER — Encounter: Payer: Self-pay | Admitting: Internal Medicine

## 2016-02-18 ENCOUNTER — Ambulatory Visit (INDEPENDENT_AMBULATORY_CARE_PROVIDER_SITE_OTHER): Payer: Medicare Other | Admitting: Internal Medicine

## 2016-02-18 DIAGNOSIS — L299 Pruritus, unspecified: Secondary | ICD-10-CM

## 2016-02-18 DIAGNOSIS — B999 Unspecified infectious disease: Secondary | ICD-10-CM | POA: Diagnosis not present

## 2016-02-18 DIAGNOSIS — I255 Ischemic cardiomyopathy: Secondary | ICD-10-CM

## 2016-02-18 DIAGNOSIS — IMO0001 Reserved for inherently not codable concepts without codable children: Secondary | ICD-10-CM

## 2016-02-18 DIAGNOSIS — T814XXD Infection following a procedure, subsequent encounter: Secondary | ICD-10-CM

## 2016-02-18 NOTE — Assessment & Plan Note (Signed)
I'm hopeful that her postoperative wound infection has been healed by a combination of recent surgery in 6 weeks of antibiotic therapy. I will stop amoxicillin now. She will follow-up with me in 4-5 weeks.

## 2016-02-18 NOTE — Assessment & Plan Note (Signed)
I'm not absolute sure what is causing her pruritus. It definitely began before she switched to amoxicillin. It is possible that she has a beta-lactam allergy and was reacting to ceftriaxone and is now getting worse with amoxicillin. If the pruritus is due to amoxicillin it should start to resolve within the next week.

## 2016-02-18 NOTE — Progress Notes (Signed)
[facility] for Infectious Disease  Patient Active Problem List   Diagnosis Date Noted  . Pruritus 02/18/2016    Priority: High  . Gram-negative infection     Priority: High  . Left hip postoperative wound infection 01/02/2016    Priority: High  . Closed intertrochanteric fracture of hip, left, sequela 12/25/2015    Priority: Medium  . Decreased strength of lower extremity   . CKD (chronic kidney disease) stage 3, GFR 30-59 ml/min   . Postoperative anemia due to acute blood loss 01/04/2016  . Anemia 12/01/2015  . Ankle fracture, left 09/17/2015  . Ankle fracture, bimalleolar, closed   . Bilateral fibular fractures 09/14/2015  . Hypertensive heart disease 05/20/2015  . CAD (coronary artery disease) 05/20/2015  . Acute systolic CHF (congestive heart failure) ([name] [name]) 05/20/2015  . Anticoagulated on Coumadin 05/20/2015  . Type 2 diabetes mellitus with vascular disease ([name] [name]) 05/17/2015  . NSTEMI (non-ST elevated myocardial infarction) ([name] [name])   . Bacteremia due to Escherichia coli 05/11/2015  . Acute kidney injury superimposed on chronic kidney disease ([name] [name]) 05/11/2015  . Uncontrolled hypertension 05/11/2015  . 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[name] [name]) 81/03/7508  . PCP NOTES >>>>>>>>>>>>>>>> 12/28/2014  . Depression 05/17/2014  . Memory loss 05/17/2014  . Anxiety and depression, PCP notes  11/24/2013  . Severe obesity (BMI >= 40) ([name] [name]) 07/14/2013  . Encounter for therapeutic drug monitoring 06/02/2013  . Dilantin toxicity 11/21/2012  . History of encephalitis 11/18/2012  . Localization-related (focal) (partial) epilepsy and epileptic syndromes with complex partial seizures, without mention of intractable epilepsy 09/27/2012  . Intertrochanteric fracture of right hip ([place]) 07/13/2012  . DM (diabetes mellitus) ([name] [name]) 01/19/2012  . GERD (gastroesophageal reflux disease) 10/05/2011  . Shoulder pain 08/27/2011  . Seizure disorder ([name] [name]) 05/24/2011  . Annual physical exam 02/10/2011  . Atrial  fibrillation ([place])   . 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 Hyperlipidemia 03/27/2006  . Hereditary and idiopathic peripheral neuropathy 03/27/2006  . 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 Essential hypertension 03/27/2006  . OSTEOPENIA 03/27/2006    Patient's Medications  New Prescriptions   No medications on file  Previous Medications   ATORVASTATIN (LIPITOR) 80 MG TABLET    Take 80 mg by mouth daily.    CALCIUM-VITAMIN D (OSCAL WITH D) 500-200 MG-UNIT PER TABLET    Take 1 tablet by mouth daily.   CLOPIDOGREL (PLAVIX) 75 MG TABLET    Take 1 tablet (75 mg total) by mouth daily.   ESCITALOPRAM (LEXAPRO) 10 MG TABLET    TAKE TWO TABLETS BY MOUTH ONCE DAILY   FERROUS SULFATE 325 (65 FE) MG EC TABLET    Take 325 mg by mouth daily with breakfast.   FUROSEMIDE (LASIX) 40 MG TABLET    Take 1 tablet (40 mg total) by mouth 2 (two) times daily.   INSULIN ASPART (NOVOLOG [place])    Inject into the skin.   INSULIN NPH-REGULAR HUMAN (NOVOLIN 70/30) (70-30) 100 UNIT/ML INJECTION    Inject 14-30 Units into the skin See admin instructions. Inject 30 units in the morning. 14 units-2 times daily with lunch and dinnner   LEVETIRACETAM (KEPPRA XR) 500 MG 24 HR TABLET    Take 1 tablet (500 mg total) by mouth daily.   LISINOPRIL (PRINIVIL,ZESTRIL) 5 MG TABLET    Take 1 tablet (5 mg total) by mouth daily.   LORAZEPAM (ATIVAN) 0.5 MG TABLET    Take 1 tablet (0.5 mg total) by mouth 2 (two) times daily as needed  for anxiety.   METHOCARBAMOL (ROBAXIN) 500 MG TABLET    Take 1 tablet (500 mg total) by mouth every 6 (six) hours as needed for muscle spasms.   METOPROLOL SUCCINATE (TOPROL-XL) 50 MG 24 HR TABLET    Take 25 mg by mouth daily. Take with or immediately following a meal.   OXYCODONE-ACETAMINOPHEN (ROXICET) 5-325 MG TABLET    Take 1 tablet by mouth every 6 (six) hours as needed for severe pain.   PANTOPRAZOLE (PROTONIX) 40 MG TABLET    Take 1 tablet (40 mg total) by mouth daily.   PHENYTOIN (DILANTIN) 200 MG ER CAPSULE     Take 1 capsule (200 mg total) by mouth 2 (two) times daily. Reduce to 1 tablet daily x 2 months then every other day x 2 months and stop   PROMETHAZINE (PHENERGAN) 12.5 MG TABLET    Take 1 tablet (12.5 mg total) by mouth every 8 (eight) hours as needed for nausea or vomiting.   PROTEIN SUPPLEMENT (PROSOURCE NO CARB) LIQD    Take 30 mLs by mouth 2 (two) times daily.   SACCHAROMYCES BOULARDII (FLORASTOR) 250 MG CAPSULE    Take 1 capsule (250 mg total) by mouth 2 (two) times daily.   WARFARIN (COUMADIN) 2.5 MG TABLET    Take 1 tablet (2.5 mg total) by mouth daily.  Modified Medications   No medications on file  Discontinued Medications   AMOXICILLIN (AMOXIL) 500 MG CAPSULE    Take 1 capsule (500 mg total) by mouth 2 (two) times daily.    Subjective: [name] [name] is in with her husband for her routine follow-up visit. She is now completed 6 weeks of antibiotic therapy for her postoperative, Proteus wound infection of her left hip. She received IV vancomycin and ceftriaxone for the first 4 weeks and has been on amoxicillin for the last 2 weeks. Her left hip wound is now completely healed and no longer draining. She states that her itching has gotten much worse over the last several weeks. She had noted itching prior to changing to amoxicillin. She's not had any fever, chills or diarrhea.  Review of Systems: Review of Systems  Constitutional: Negative for chills, diaphoresis and fever.  Gastrointestinal: Negative for abdominal pain, diarrhea, nausea and vomiting.  Musculoskeletal: Positive for joint pain.  Skin: Positive for itching. Negative for rash.       Excoriations but no rash.    Past Medical History:  Diagnosis Date  . Atrial fibrillation ([name] [name])    SVT dx 2007, cath 2007 mild CAD, then had an cardioversion, ablation; still on coumadin , has occ palpitation, EKG 03-2010 NSR  . Blindness of left eye   . DIABETES MELLITUS, TYPE II 03/27/2006   dr [name] [name]  . Eye muscle weakness    Right  eye weakness after cataract surgery  . GERD (gastroesophageal reflux disease) 10/05/2011  . 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 HYPERLIPIDEMIA 03/27/2006  . HYPERTENSION 03/27/2006  . Intertrochanteric fracture of right hip ([place]) 07/13/2012  . 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 Osteopenia 2004   Dexa 2004 showed Osteopenia, DEXA 03/2007 normal  . Osteopenia   . Other chronic cystitis with hematuria   . Recurrent urinary tract infection    Seeing Urology  . 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 Seizures ([place])   . 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[place]) 10/26/784    Social History  Substance Use Topics  . Smoking status: Former Smoker    Types: Cigarettes    Quit date:  03/02/1978  . Smokeless tobacco: Never Used  . Alcohol use No    Family History  Problem Relation Age of Onset  . Diabetes Father   . Heart attack Father 54  . Diabetes Sister   . Cancer Neg Hx     no hx of colon or breast cancer    Allergies  Allergen Reactions  . Procaine Hcl Anaphylaxis    Objective: Vitals:   02/18/16 1045  BP: (!) 102/54  Pulse: 70  Temp: 97.6 F (36.4 C)  TempSrc: Oral  Weight: 193 lb 8 oz (87.8 kg)   Body mass index is 28.57 kg/m.  Physical Exam  Constitutional: She is oriented to person, place, and time.  She is uncomfortable due to the itching. She is seated in her wheelchair.  HENT:  Mouth/Throat: No oropharyngeal exudate.  Eyes: Conjunctivae are normal.  Musculoskeletal:  Her left hip incision is completely healed without any evidence of active infection.  Neurological: She is alert and oriented to person, place, and time.  Skin: No rash noted.  Widely scattered excoriations on her chest, upper back and arms.  Psychiatric: Mood and affect normal.    Lab Results    Problem List Items Addressed This Visit      High   Left hip postoperative wound infection    I'm hopeful that her postoperative wound infection has been healed by a combination of recent surgery in 6 weeks of  antibiotic therapy. I will stop amoxicillin now. She will follow-up with me in 4-5 weeks.      Pruritus    I'm not absolute sure what is causing her pruritus. It definitely began before she switched to amoxicillin. It is possible that she has a beta-lactam allergy and was reacting to ceftriaxone and is now getting worse with amoxicillin. If the pruritus is due to amoxicillin it should start to resolve within the next week.          Michel Bickers, MD [facility] for Infect[facility] [place] [facility] [phone] pager   [phone] cell 02/18/2016, 11:14 AM

## 2016-02-19 ENCOUNTER — Ambulatory Visit (INDEPENDENT_AMBULATORY_CARE_PROVIDER_SITE_OTHER): Payer: Medicare Other | Admitting: Orthopaedic Surgery

## 2016-02-25 ENCOUNTER — Ambulatory Visit (INDEPENDENT_AMBULATORY_CARE_PROVIDER_SITE_OTHER): Payer: Medicare Other | Admitting: Pharmacist

## 2016-02-25 ENCOUNTER — Telehealth: Payer: Self-pay | Admitting: Internal Medicine

## 2016-02-25 DIAGNOSIS — Z5181 Encounter for therapeutic drug level monitoring: Secondary | ICD-10-CM

## 2016-02-25 DIAGNOSIS — I4891 Unspecified atrial fibrillation: Secondary | ICD-10-CM

## 2016-02-25 LAB — POCT INR: INR: 2.5

## 2016-02-25 NOTE — Telephone Encounter (Signed)
Caller name: Constance Haw Relationship to patient: [facility] Can be reached: [phone] Pharmacy:  Reason for call: [facility] Physical Therapist call to inform provider that patient will not be seen today for PT because patients has just had a sudden death in her family. PT will resume on next week.

## 2016-02-25 NOTE — Telephone Encounter (Signed)
Noted  

## 2016-02-26 ENCOUNTER — Encounter (INDEPENDENT_AMBULATORY_CARE_PROVIDER_SITE_OTHER): Payer: Self-pay | Admitting: Orthopaedic Surgery

## 2016-02-26 ENCOUNTER — Ambulatory Visit (INDEPENDENT_AMBULATORY_CARE_PROVIDER_SITE_OTHER): Payer: Medicare Other | Admitting: Orthopaedic Surgery

## 2016-02-26 VITALS — BP 163/65 | HR 82 | Ht 69.0 in | Wt 193.0 lb

## 2016-02-26 DIAGNOSIS — S72142S Displaced intertrochanteric fracture of left femur, sequela: Secondary | ICD-10-CM

## 2016-02-26 NOTE — Progress Notes (Signed)
Post-Op Visit Note   Patient: [name] [name]           Date of Birth: 1940-12-08           MRN: [id number] Visit Date: 02/26/2016 PCP: [name], MD   Assessment & Plan: The patient's wound is completely healed she is off antibiotics she is walking with a rolling walker. Symptoms are improved pain or hip has resolved.  Chief Complaint: No chief complaint on file.  Visit Diagnoses: No diagnosis found.  HPI: Patient is here for four week return office visit. She is following up left hip I&D 01/03/16. She states her hip is well healed and no longer draining. She is feeling well and much better overall.  Plan: Continue walking and strengthening return 3 months for final visit. She will call she develops any increased pain.  Follow-Up Instructions: No Follow-up on file.   Orders:  No orders of the defined types were placed in this encounter.  No orders of the defined types were placed in this encounter.    PMFS History: Patient Active Problem List   Diagnosis Date Noted  . Pruritus 02/18/2016  . Decreased strength of lower extremity   . Gram-negative infection   . CKD (chronic kidney disease) stage 3, GFR 30-59 ml/min   . Postoperative anemia due to acute blood loss 01/04/2016  . Left hip postoperative wound infection 01/02/2016  . Closed intertrochanteric fracture of hip, left, sequela 12/25/2015  . Anemia 12/01/2015  . Ankle fracture, left 09/17/2015  . Ankle fracture, bimalleolar, closed   . Bilateral fibular fractures 09/14/2015  . Hypertensive heart disease 05/20/2015  . CAD (coronary artery disease) 05/20/2015  . Acute systolic CHF (congestive heart failure) ([place]) 05/20/2015  . Anticoagulated on Coumadin 05/20/2015  . Type 2 diabetes mellitus with vascular disease ([place]) 05/17/2015  . NSTEMI (non-ST elevated myocardial infarction) ([place])   . Bacteremia due to Escherichia coli 05/11/2015  . Acute kidney injury superimposed on chronic kidney disease ([place]) 05/11/2015    . Uncontrolled hypertension 05/11/2015  . 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[place]) 09/32/3557  . PCP NOTES >>>>>>>>>>>>>>>> 12/28/2014  . Depression 05/17/2014  . Memory loss 05/17/2014  . Anxiety and depression, PCP notes  11/24/2013  . Severe obesity (BMI >= 40) ([place]) 07/14/2013  . Encounter for therapeutic drug monitoring 06/02/2013  . Dilantin toxicity 11/21/2012  . History of encephalitis 11/18/2012  . Localization-related (focal) (partial) epilepsy and epileptic syndromes with complex partial seizures, without mention of intractable epilepsy 09/27/2012  . Intertrochanteric fracture of right hip ([place]) 07/13/2012  . DM (diabetes mellitus) ([place]) 01/19/2012  . GERD (gastroesophageal reflux disease) 10/05/2011  . Shoulder pain 08/27/2011  . Seizure disorder ([place]) 05/24/2011  . Annual physical exam 02/10/2011  . Atrial fibrillation ([place])   . 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 Hyperlipidemia 03/27/2006  . Hereditary and idiopathic peripheral neuropathy 03/27/2006  . 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 Essential hypertension 03/27/2006  . OSTEOPENIA 03/27/2006   Past Medical History:  Diagnosis Date  . Atrial fibrillation ([place])    SVT dx 2007, cath 2007 mild CAD, then had an cardioversion, ablation; still on coumadin , has occ palpitation, EKG 03-2010 NSR  . Blindness of left eye   . DIABETES MELLITUS, TYPE II 03/27/2006   dr [name]  . Eye muscle weakness    Right eye weakness after cataract surgery  . GERD (gastroesophageal reflux disease) 10/05/2011  . 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 HYPERLIPIDEMIA 03/27/2006  . HYPERTENSION 03/27/2006  . Intertrochanteric fracture of right  hip ([place]) 07/13/2012  . 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 Osteopenia 2004   Dexa 2004 showed Osteopenia, DEXA 03/2007 normal  . Osteopenia   . Other chronic cystitis with hematuria   . Recurrent urinary tract infection    Seeing Urology  . 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 Seizures ([place])   . 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[place])  7/84/6962    Family History  Problem Relation Age of Onset  . Diabetes Father   . Heart attack Father 11  . Diabetes Sister   . Cancer Neg Hx     no hx of colon or breast cancer    Past Surgical History:  Procedure Laterality Date  . CARDIAC CATHETERIZATION N/A 05/17/2015   Procedure: Left Heart Cath and Coronary Angiography;  Surgeon: Jettie Booze, MD;  Location: [facility];  Service: Cardiovascular;  Laterality: N/A;  . CARDIAC CATHETERIZATION N/A 05/17/2015   Procedure: Coronary Balloon Angioplasty;  Surgeon: Jettie Booze, MD;  Location: [facility];  Service: Cardiovascular;  Laterality: N/A;  . FEMUR IM NAIL Right 07/15/2012   Procedure: INTRAMEDULLARY (IM) NAIL HIP;  Surgeon: Johnny Bridge, MD;  Location: [facility];  Service: Orthopedics;  Laterality: Right;  . FEMUR IM NAIL Left 12/02/2015   Procedure: INTRAMEDULLARY (IM) NAIL FEMORAL;  Surgeon: [name], MD;  Location: [facility];  Service: Orthopedics;  Laterality: Left;  . INCISION AND DRAINAGE HIP Left 01/03/2016   Procedure: IRRIGATION AND DEBRIDEMENT HIP;  Surgeon: [name], MD;  Location: [facility];  Service: Orthopedics;  Laterality: Left;  . SHOULDER SURGERY     Social History   Occupational History  . retired  Retired   Social History Main Topics  . Smoking status: Former Smoker    Types: Cigarettes    Quit date: 03/02/1978  . Smokeless tobacco: Never Used  . Alcohol use No  . Drug use: No  . Sexual activity: No

## 2016-02-28 NOTE — Progress Notes (Addendum)
Subjective:   [name] [name] is a 75 y.o. female who presents for Medicare Annual (Subsequent) preventive examination.  Review of Systems:  No ROS.  Medicare Wellness Visit. Cardiac Risk Factors include: advanced age (>3men, >53 women);diabetes mellitus;hypertension;sedentary lifestyle Sleep patterns:  Sleeps about 8 hrs per night. Feels very rested.  Home Safety/Smoke Alarms: Smoke and carbon monoxide detectors.  Living environment; residence and Firearm Safety: Lives with husband in 3 story house. Has stair lift. No guns. Feels safe. Seat Belt Safety/Bike Helmet: Wears seatbelt.    Counseling:   Eye Exam- Last exam in 01/2016. Follows eye doctor annually.  Dental- Upper and lower dentures. No dentist or gum doctor. Gum care discussed.  Female:   Pap-   Pt declines.     Mammo-  Last 08/14/14. Bi-Rads Category 1:neg. F/u 1 yr recommended.    Dexa scan- Pt declines CCS-  Pt declines  Objective:     Vitals: BP 130/62 (BP Location: Left Arm, Patient Position: Sitting, Cuff Size: Normal)   Pulse 78   Ht 5\' 9"  (1.753 m)   Wt 185 lb 9.6 oz (84.2 kg)   SpO2 98%   BMI 27.41 kg/m   Body mass index is 27.41 kg/m.   Tobacco History  Smoking Status  . Former Smoker  . Types: Cigarettes  . Quit date: 03/02/1978  Smokeless Tobacco  . Never Used     Counseling given: No   Past Medical History:  Diagnosis Date  . Atrial fibrillation ([place])    SVT dx 2007, cath 2007 mild CAD, then had an cardioversion, ablation; still on coumadin , has occ palpitation, EKG 03-2010 NSR  . Blindness of left eye   . DIABETES MELLITUS, TYPE II 03/27/2006   dr [name]  . Eye muscle weakness    Right eye weakness after cataract surgery  . GERD (gastroesophageal reflux disease) 10/05/2011  . 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 HYPERLIPIDEMIA 03/27/2006  . HYPERTENSION 03/27/2006  . Intertrochanteric fracture of right hip ([place]) 07/13/2012  . 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 Osteopenia 2004     Dexa 2004 showed Osteopenia, DEXA 03/2007 normal  . Osteopenia   . Other chronic cystitis with hematuria   . Recurrent urinary tract infection    Seeing Urology  . 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 Seizures ([place])   . 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[place]) 2/63/3354   Past Surgical History:  Procedure Laterality Date  . Bilateral anke surgery Bilateral   . CARDIAC CATHETERIZATION N/A 05/17/2015   Procedure: Left Heart Cath and Coronary Angiography;  Surgeon: Jettie Booze, MD;  Location: [facility];  Service: Cardiovascular;  Laterality: N/A;  . CARDIAC CATHETERIZATION N/A 05/17/2015   Procedure: Coronary Balloon Angioplasty;  Surgeon: Jettie Booze, MD;  Location: [facility];  Service: Cardiovascular;  Laterality: N/A;  . FEMUR IM NAIL Right 07/15/2012   Procedure: INTRAMEDULLARY (IM) NAIL HIP;  Surgeon: Johnny Bridge, MD;  Location: [facility];  Service: Orthopedics;  Laterality: Right;  . FEMUR IM NAIL Left 12/02/2015   Procedure: INTRAMEDULLARY (IM) NAIL FEMORAL;  Surgeon: [name], MD;  Location: [facility];  Service: Orthopedics;  Laterality: Left;  . INCISION AND DRAINAGE HIP Left 01/03/2016   Procedure: IRRIGATION AND DEBRIDEMENT HIP;  Surgeon: [name], MD;  Location: [facility];  Service: Orthopedics;  Laterality: Left;  . left hip surgery    . SHOULDER SURGERY     Family History  Problem Relation Age  of Onset  . Diabetes Father   . Heart attack Father 63  . Diabetes Sister   . Cancer Neg Hx     no hx of colon or breast cancer   History  Sexual Activity  . Sexual activity: No    Outpatient Encounter Prescriptions as of 03/03/2016  Medication Sig  . atorvastatin (LIPITOR) 80 MG tablet Take 80 mg by mouth daily.   . calcium-vitamin D (OSCAL WITH D) 500-200 MG-UNIT per tablet Take 1 tablet by mouth daily.  [name] escitalopram (LEXAPRO) 10 MG tablet TAKE TWO TABLETS BY MOUTH ONCE DAILY  . furosemide (LASIX) 40 MG tablet Take 1 tablet (40 mg total) by mouth 2 (two)  times daily.  . Insulin Aspart (NOVOLOG [place]) Inject into the skin.  [name] insulin NPH-regular Human (NOVOLIN 70/30) (70-30) 100 UNIT/ML injection Inject 14-30 Units into the skin See admin instructions. Inject 30 units in the morning. 14 units-2 times daily with lunch and dinnner  . lisinopril (PRINIVIL,ZESTRIL) 5 MG tablet Take 1 tablet (5 mg total) by mouth daily.  [name] LORazepam (ATIVAN) 0.5 MG tablet Take 1 tablet (0.5 mg total) by mouth 2 (two) times daily as needed for anxiety.  . pantoprazole (PROTONIX) 40 MG tablet Take 1 tablet (40 mg total) by mouth daily.  . phenytoin (DILANTIN) 200 MG ER capsule Take 1 capsule (200 mg total) by mouth 2 (two) times daily. Reduce to 1 tablet daily x 2 months then every other day x 2 months and stop (Patient taking differently: Take 200 mg by mouth daily. Reduce to 1 tablet daily x 2 months then every other day x 2 months and stop)  . promethazine (PHENERGAN) 12.5 MG tablet Take 1 tablet (12.5 mg total) by mouth every 8 (eight) hours as needed for nausea or vomiting.  . saccharomyces boulardii (FLORASTOR) 250 MG capsule Take 1 capsule (250 mg total) by mouth 2 (two) times daily.  [name] warfarin (COUMADIN) 2.5 MG tablet Take 1 tablet (2.5 mg total) by mouth daily.  . clopidogrel (PLAVIX) 75 MG tablet Take 1 tablet (75 mg total) by mouth daily. (Patient not taking: Reported on 03/03/2016)  . ferrous sulfate 325 (65 FE) MG EC tablet Take 325 mg by mouth daily with breakfast.  . levETIRAcetam (KEPPRA XR) 500 MG 24 hr tablet Take 1 tablet (500 mg total) by mouth daily. (Patient not taking: Reported on 03/03/2016)  . methocarbamol (ROBAXIN) 500 MG tablet Take 1 tablet (500 mg total) by mouth every 6 (six) hours as needed for muscle spasms. (Patient not taking: Reported on 03/03/2016)  . metoprolol succinate (TOPROL-XL) 50 MG 24 hr tablet Take 25 mg by mouth daily. Take with or immediately following a meal.  . oxyCODONE-acetaminophen (ROXICET) 5-325 MG tablet Take 1 tablet by mouth  every 6 (six) hours as needed for severe pain. (Patient not taking: Reported on 03/03/2016)  . protein supplement (PROSOURCE NO CARB) LIQD Take 30 mLs by mouth 2 (two) times daily.   No facility-administered encounter medications on file as of 03/03/2016.     Activities of Daily Living In your present state of health, do you have any difficulty performing the following activities: 03/03/2016 01/02/2016  Hearing? N -  Vision? Y -  Difficulty concentrating or making decisions? N -  Walking or climbing stairs? Y -  Dressing or bathing? Y -  Doing errands, shopping? Tempie Donning  Preparing Food and eating ? Y -  Using the Toilet? N -  In the past six months, have  you accidently leaked urine? Y -  Do you have problems with loss of bowel control? N -  Managing your Medications? N -  Managing your Finances? N -  Housekeeping or managing your Housekeeping? N -  Some recent data might be hidden    Patient Care Team: Colon Branch, MD as PCP - General Garvin Fila, MD as Consulting Physician (Neurology) Cleon Gustin, MD as Consulting Physician (Urology) [name], MD as Consulting Physician (Orthopedic Surgery) Renato Shin, MD as Consulting Physician (Endocrinology) Michel Bickers, MD as Consulting Physician (Infectious Diseases) Evans Lance, MD as Consulting Physician (Cardiology)    Assessment:    Physical assessment deferred to PCP.  Exercise Activities and Dietary recommendations   Diet (meal preparation, eat out, water intake, caffeinated beverages, dairy products, fruits and vegetables): in general, an "unhealthy" diet, on average, 3 meals per day Breakfast: Cheerios, OJ Lunch: Soup or sandwich or left overs Dinner: Varies      Goals    . <enter goal here>          Pt would like to be walking better with rolling chair walker only.       Fall Risk Fall Risk  03/03/2016 02/18/2016 01/29/2016 11/29/2015 05/30/2015  Falls in the past year? Yes Yes Yes Yes Yes  Number falls in  past yr: 2 or more 2 or more 2 or more 1 1  Injury with Fall? Yes Yes Yes Yes No  Risk Factor Category  High Fall Risk High Fall Risk High Fall Risk - -  Risk for fall due to : History of fall(s);Impaired mobility History of fall(s) History of fall(s) - -  Follow up Education provided;Falls prevention discussed Falls evaluation completed - Falls evaluation completed;Education provided Education provided;Falls prevention discussed   Depression Screen PHQ 2/9 Scores 03/03/2016 02/18/2016 01/29/2016 11/29/2015  PHQ - 2 Score 0 0 0 0     Cognitive Function MMSE - Mini Mental State Exam 03/03/2016 05/17/2014  Orientation to time 5 5  Orientation to Place 5 2  Registration 3 3  Attention/ Calculation 5 5  Recall 0 0  Language- name 2 objects 2 2  Language- repeat 1 1  Language- follow 3 step command 2 3  Language- read & follow direction 1 1  Write a sentence 1 1  Copy design 0 1  Total score 25 24        Immunization History  Administered Date(s) Administered  . Influenza Split 01/19/2012  . Influenza, High Dose Seasonal PF 01/24/2013, 12/04/2014, 11/29/2015  . Influenza,inj,Quad PF,36+ Mos 11/24/2013  . PPD Test 09/18/2015  . Pneumococcal Conjugate-13 12/28/2014  . Pneumococcal Polysaccharide-23 01/24/2013  . Td 08/01/2007, 12/15/2010  . Tdap 01/22/2014  . Zoster 03/03/2003, 05/28/2013   Screening Tests Health Maintenance  Topic Date Due  . MAMMOGRAM  04/03/2016 (Originally 08/14/2015)  . COLONOSCOPY  04/03/2016 (Originally 12/14/1991)  . HEMOGLOBIN A1C  06/01/2016  . FOOT EXAM  06/16/2016  . OPHTHALMOLOGY EXAM  07/23/2016  . TETANUS/TDAP  01/23/2024  . INFLUENZA VACCINE  Completed  . DEXA SCAN  Completed  . ZOSTAVAX  Completed  . PNA vac Low Risk Adult  Completed      Plan:     Follow up with Dr.[name] as scheduled 04/09/16 @10am .  Eat a heart healthy diet (full of fruits, vegetables, whole grains, lean protein, water--limit salt, fat, and sugar intake) and increase  physical activity as tolerated.  Continue doing brain stimulating activities (puzzles, reading, adult coloring  books, staying active) to keep memory sharp.    Bring a copy of your advance directives to your next office visit.  During the course of the visit the patient was educated and counseled about the following appropriate screening and preventive services:   Vaccines to include Pneumoccal, Influenza, Hepatitis B, Td, Zostavax, HCV  Cardiovascular Disease  Colorectal cancer screening  Bone density screening  Diabetes screening  Glaucoma screening  Mammography/PAP  Nutrition counseling   Patient Instructions (the written plan) was given to the patient.   Naaman Plummer [place], [place]  03/03/2016  [name], MD

## 2016-02-28 NOTE — Progress Notes (Signed)
Pre visit review using our clinic review tool, if applicable. No additional management support is needed unless otherwise documented below in the visit note. 

## 2016-03-03 ENCOUNTER — Encounter: Payer: Self-pay | Admitting: *Deleted

## 2016-03-03 ENCOUNTER — Ambulatory Visit (INDEPENDENT_AMBULATORY_CARE_PROVIDER_SITE_OTHER): Payer: Medicare Other | Admitting: *Deleted

## 2016-03-03 VITALS — BP 130/62 | HR 78 | Ht 69.0 in | Wt 185.6 lb

## 2016-03-03 DIAGNOSIS — Z0000 Encounter for general adult medical examination without abnormal findings: Secondary | ICD-10-CM | POA: Diagnosis not present

## 2016-03-03 NOTE — Patient Instructions (Addendum)
Follow up with Dr.[name] as scheduled 04/09/16 @10am .  Eat a heart healthy diet (full of fruits, vegetables, whole grains, lean protein, water--limit salt, fat, and sugar intake) and increase physical activity as tolerated.  Continue doing brain stimulating activities (puzzles, reading, adult coloring books, staying active) to keep memory sharp.    Bring a copy of your advance directives to your next office visit.    Fall Prevention in the Home Introduction Falls can cause injuries. They can happen to people of all ages. There are many things you can do to make your home safe and to help prevent falls. What can I do on the outside of my home?  Regularly fix the edges of walkways and driveways and fix any cracks.  Remove anything that might make you trip as you walk through a door, such as a raised step or threshold.  Trim any bushes or trees on the path to your home.  Use bright outdoor lighting.  Clear any walking paths of anything that might make someone trip, such as rocks or tools.  Regularly check to see if handrails are loose or broken. Make sure that both sides of any steps have handrails.  Any raised decks and porches should have guardrails on the edges.  Have any leaves, snow, or ice cleared regularly.  Use sand or salt on walking paths during winter.  Clean up any spills in your garage right away. This includes oil or grease spills. What can I do in the bathroom?  Use night lights.  Install grab bars by the toilet and in the tub and shower. Do not use towel bars as grab bars.  Use non-skid mats or decals in the tub or shower.  If you need to sit down in the shower, use a plastic, non-slip stool.  Keep the floor dry. Clean up any water that spills on the floor as soon as it happens.  Remove soap buildup in the tub or shower regularly.  Attach bath mats securely with double-sided non-slip rug tape.  Do not have throw rugs and other things on the floor that can  make you trip. What can I do in the bedroom?  Use night lights.  Make sure that you have a light by your bed that is easy to reach.  Do not use any sheets or blankets that are too big for your bed. They should not hang down onto the floor.  Have a firm chair that has side arms. You can use this for support while you get dressed.  Do not have throw rugs and other things on the floor that can make you trip. What can I do in the kitchen?  Clean up any spills right away.  Avoid walking on wet floors.  Keep items that you use a lot in easy-to-reach places.  If you need to reach something above you, use a strong step stool that has a grab bar.  Keep electrical cords out of the way.  Do not use floor polish or wax that makes floors slippery. If you must use wax, use non-skid floor wax.  Do not have throw rugs and other things on the floor that can make you trip. What can I do with my stairs?  Do not leave any items on the stairs.  Make sure that there are handrails on both sides of the stairs and use them. Fix handrails that are broken or loose. Make sure that handrails are as long as the stairways.  Check any carpeting  to make sure that it is firmly attached to the stairs. Fix any carpet that is loose or worn.  Avoid having throw rugs at the top or bottom of the stairs. If you do have throw rugs, attach them to the floor with carpet tape.  Make sure that you have a light switch at the top of the stairs and the bottom of the stairs. If you do not have them, ask someone to add them for you. What else can I do to help prevent falls?  Wear shoes that:  Do not have high heels.  Have rubber bottoms.  Are comfortable and fit you well.  Are closed at the toe. Do not wear sandals.  If you use a stepladder:  Make sure that it is fully opened. Do not climb a closed stepladder.  Make sure that both sides of the stepladder are locked into place.  Ask someone to hold it for you,  if possible.  Clearly mark and make sure that you can see:  Any grab bars or handrails.  First and last steps.  Where the edge of each step is.  Use tools that help you move around (mobility aids) if they are needed. These include:  Canes.  Walkers.  Scooters.  Crutches.  Turn on the lights when you go into a dark area. Replace any light bulbs as soon as they burn out.  Set up your furniture so you have a clear path. Avoid moving your furniture around.  If any of your floors are uneven, fix them.  If there are any pets around you, be aware of where they are.  Review your medicines with your doctor. Some medicines can make you feel dizzy. This can increase your chance of falling. Ask your doctor what other things that you can do to help prevent falls. This information is not intended to replace advice given to you by your health care provider. Make sure you discuss any questions you have with your health care provider. Document Released: 12/13/2008 Document Revised: 07/25/2015 Document Reviewed: 03/23/2014  2017 Elsevier

## 2016-03-04 ENCOUNTER — Telehealth: Payer: Self-pay | Admitting: Internal Medicine

## 2016-03-04 NOTE — Telephone Encounter (Signed)
FYI

## 2016-03-04 NOTE — Telephone Encounter (Addendum)
Caller name: Almira  Relation to pt: PT from [facility] Call back number:[phone]   Reason for call:  patient declined to be seen for PT due to grieving the loss of her son.

## 2016-03-05 NOTE — Telephone Encounter (Signed)
Spoke with the patient, give her my condolences, knows to call me if needed

## 2016-03-06 ENCOUNTER — Telehealth: Payer: Self-pay | Admitting: Internal Medicine

## 2016-03-06 NOTE — Telephone Encounter (Signed)
Noted  

## 2016-03-06 NOTE — Telephone Encounter (Signed)
Caller name: Janett Billow  Relation to pt: RN from [facility]   Call back number: [phone]   Reason for call:  Patient is still grieving due to the passing of her son, declined nursing visit for today.

## 2016-03-09 ENCOUNTER — Ambulatory Visit (INDEPENDENT_AMBULATORY_CARE_PROVIDER_SITE_OTHER): Payer: Medicare Other | Admitting: Internal Medicine

## 2016-03-09 DIAGNOSIS — I4891 Unspecified atrial fibrillation: Secondary | ICD-10-CM

## 2016-03-09 DIAGNOSIS — Z5181 Encounter for therapeutic drug level monitoring: Secondary | ICD-10-CM

## 2016-03-09 LAB — POCT INR: INR: 1.6

## 2016-03-10 ENCOUNTER — Telehealth: Payer: Self-pay | Admitting: Internal Medicine

## 2016-03-10 NOTE — Telephone Encounter (Signed)
LMOM w/ verbal orders.  

## 2016-03-10 NOTE — Telephone Encounter (Signed)
[name] [name] from [facility] called to request a verbal order for PT for 2 week 2 and 1 week 1. Please advise.   Phone: [phone]

## 2016-03-15 ENCOUNTER — Other Ambulatory Visit: Payer: Self-pay | Admitting: Internal Medicine

## 2016-03-17 NOTE — Telephone Encounter (Signed)
Marval Regal, RN  Below is the note from 12/24/2015.Pt was taper off dilantin after 2 months. Pt suppose to stop dilantin in December 2017.   [] Hide copied text [] Hover for attribution information Rn spoke with Estate manager/land agent at [facility]. Aisha stated she needs clarification on the dilantin tapering. THe order form is hard to read with the carbon copy. Rn stated the pt was given a AVS. Rn stated per Dr. Leonie Man pt is to start keppra now daily. While on keppra she is take to reduce one tablet daily for 2 months, and one tablet every other day for 2 months. She does the one tablet of dilantin every other day for 2 months than stop. Pt will take keppra ongoing. Rn stated a copy of the office notes can be fax to [facility]. Fax was given of [phone] to attention Aisha Rn. Information was fax and receive.

## 2016-03-23 ENCOUNTER — Ambulatory Visit (INDEPENDENT_AMBULATORY_CARE_PROVIDER_SITE_OTHER): Payer: Medicare Other | Admitting: Pharmacist

## 2016-03-23 ENCOUNTER — Telehealth: Payer: Self-pay | Admitting: Internal Medicine

## 2016-03-23 DIAGNOSIS — I4891 Unspecified atrial fibrillation: Secondary | ICD-10-CM

## 2016-03-23 DIAGNOSIS — Z5181 Encounter for therapeutic drug level monitoring: Secondary | ICD-10-CM

## 2016-03-23 LAB — POCT INR: INR: 1.6

## 2016-03-23 NOTE — Telephone Encounter (Signed)
I called Ms. Hunton today to let her know that our clinic is temporary closed for repairs. She told me that she is doing very well. Her left hip wound has healed completely. She saw Dr. Rodell Perna, her orthopedic surgeon on 02/26/2016 and he confirmed this. Her intense itching resolved promptly after stopping amoxicillin 5 weeks ago suggesting that she was having an allergic reaction. She will follow-up with me as needed.

## 2016-03-24 ENCOUNTER — Ambulatory Visit: Payer: Medicare Other | Admitting: Internal Medicine

## 2016-03-28 ENCOUNTER — Other Ambulatory Visit: Payer: Self-pay | Admitting: Internal Medicine

## 2016-03-30 ENCOUNTER — Ambulatory Visit (INDEPENDENT_AMBULATORY_CARE_PROVIDER_SITE_OTHER): Payer: Medicare Other | Admitting: Internal Medicine

## 2016-03-30 DIAGNOSIS — I4891 Unspecified atrial fibrillation: Secondary | ICD-10-CM

## 2016-03-30 DIAGNOSIS — Z5181 Encounter for therapeutic drug level monitoring: Secondary | ICD-10-CM

## 2016-03-30 LAB — POCT INR: INR: 1.5

## 2016-03-30 NOTE — Telephone Encounter (Signed)
Agree with stopping phenytoin and not continuing as she has had persistent subtherepeutic levels despite being on 400mg  daily and I swiched her to keppra instead and she has not had any breakthru seizures

## 2016-04-01 NOTE — Progress Notes (Signed)
This is not my patient please send this to Rodell Perna thanks

## 2016-04-06 ENCOUNTER — Ambulatory Visit (INDEPENDENT_AMBULATORY_CARE_PROVIDER_SITE_OTHER): Payer: Medicare Other | Admitting: *Deleted

## 2016-04-06 DIAGNOSIS — I4891 Unspecified atrial fibrillation: Secondary | ICD-10-CM

## 2016-04-06 DIAGNOSIS — I4892 Unspecified atrial flutter: Secondary | ICD-10-CM

## 2016-04-06 DIAGNOSIS — Z7901 Long term (current) use of anticoagulants: Secondary | ICD-10-CM

## 2016-04-06 DIAGNOSIS — Z5181 Encounter for therapeutic drug level monitoring: Secondary | ICD-10-CM

## 2016-04-06 LAB — POCT INR: INR: 1.3

## 2016-04-09 ENCOUNTER — Encounter: Payer: Self-pay | Admitting: Internal Medicine

## 2016-04-09 ENCOUNTER — Telehealth: Payer: Self-pay | Admitting: *Deleted

## 2016-04-09 ENCOUNTER — Ambulatory Visit (INDEPENDENT_AMBULATORY_CARE_PROVIDER_SITE_OTHER): Payer: Medicare Other | Admitting: Internal Medicine

## 2016-04-09 VITALS — BP 128/76 | HR 66 | Temp 98.1°F | Resp 14 | Ht 69.0 in | Wt 203.0 lb

## 2016-04-09 DIAGNOSIS — Z794 Long term (current) use of insulin: Secondary | ICD-10-CM | POA: Diagnosis not present

## 2016-04-09 DIAGNOSIS — Z0000 Encounter for general adult medical examination without abnormal findings: Secondary | ICD-10-CM | POA: Diagnosis not present

## 2016-04-09 DIAGNOSIS — I10 Essential (primary) hypertension: Secondary | ICD-10-CM

## 2016-04-09 DIAGNOSIS — D649 Anemia, unspecified: Secondary | ICD-10-CM | POA: Diagnosis not present

## 2016-04-09 DIAGNOSIS — N183 Chronic kidney disease, stage 3 unspecified: Secondary | ICD-10-CM

## 2016-04-09 DIAGNOSIS — E1122 Type 2 diabetes mellitus with diabetic chronic kidney disease: Secondary | ICD-10-CM | POA: Diagnosis not present

## 2016-04-09 DIAGNOSIS — E785 Hyperlipidemia, unspecified: Secondary | ICD-10-CM

## 2016-04-09 DIAGNOSIS — I2510 Atherosclerotic heart disease of native coronary artery without angina pectoris: Secondary | ICD-10-CM

## 2016-04-09 DIAGNOSIS — G40909 Epilepsy, unspecified, not intractable, without status epilepticus: Secondary | ICD-10-CM

## 2016-04-09 LAB — BASIC METABOLIC PANEL
BUN: 23 mg/dL (ref 6–23)
CALCIUM: 9 mg/dL (ref 8.4–10.5)
CO2: 30 mEq/L (ref 19–32)
Chloride: 106 mEq/L (ref 96–112)
Creatinine, Ser: 1.39 mg/dL — ABNORMAL HIGH (ref 0.40–1.20)
GFR: 39.36 mL/min — AB (ref >60.00)
Glucose, Bld: 38 mg/dL — CL (ref 70–99)
POTASSIUM: 4.6 meq/L (ref 3.5–5.1)
SODIUM: 140 meq/L (ref 135–145)

## 2016-04-09 LAB — CBC WITH DIFFERENTIAL/PLATELET
BASOS PCT: 1 % (ref 0.0–3.0)
Basophils Absolute: 0.1 10*3/uL (ref 0.0–0.1)
Eosinophils Absolute: 0.1 10*3/uL (ref 0.0–0.7)
Eosinophils Relative: 2.3 % (ref 0.0–5.0)
HCT: 34.6 % — ABNORMAL LOW (ref 36.0–46.0)
Hemoglobin: 11.4 g/dL — ABNORMAL LOW (ref 12.0–15.0)
LYMPHS ABS: 2.1 10*3/uL (ref 0.7–4.0)
Lymphocytes Relative: 33 % (ref 12.0–46.0)
MCHC: 33 g/dL (ref 30.0–36.0)
MCV: 92.6 fl (ref 78.0–100.0)
MONO ABS: 0.6 10*3/uL (ref 0.1–1.0)
MONOS PCT: 10.1 % (ref 3.0–12.0)
NEUTROS PCT: 53.6 % (ref 43.0–77.0)
Neutro Abs: 3.4 10*3/uL (ref 1.4–7.7)
Platelets: 289 10*3/uL (ref 150.0–400.0)
RBC: 3.74 Mil/uL — AB (ref 3.87–5.11)
RDW: 15.2 % (ref 11.5–15.5)
WBC: 6.3 10*3/uL (ref 4.0–10.5)

## 2016-04-09 LAB — LIPID PANEL
Cholesterol: 239 mg/dL — ABNORMAL HIGH (ref 0–200)
HDL: 63.6 mg/dL
LDL Cholesterol: 161 mg/dL — ABNORMAL HIGH (ref 0–99)
NonHDL: 175.63
Total CHOL/HDL Ratio: 4
Triglycerides: 73 mg/dL (ref 0.0–149.0)
VLDL: 14.6 mg/dL (ref 0.0–40.0)

## 2016-04-09 LAB — IRON: Iron: 39 ug/dL — ABNORMAL LOW (ref 42–145)

## 2016-04-09 LAB — FERRITIN: Ferritin: 86.1 ng/mL (ref 10.0–291.0)

## 2016-04-09 NOTE — Assessment & Plan Note (Addendum)
Td 2012 ; PNM 23-- 2014;  prevnar 2016;  zostavax--2015; had a flu shot   Breast Ca screening-- MMG (-) 2016, not interested on further MMG, ok per guidelines  No further PAPs  Colon cancer screening discussed: no FH, never had a scope, pro-cons discussed, elected no further screening. If she has persistent anemia Will reassess the need for a colonoscopy.   Diet-exercise discussed

## 2016-04-09 NOTE — Progress Notes (Signed)
Pre visit review using our clinic review tool, if applicable. No additional management support is needed unless otherwise documented below in the visit note. 

## 2016-04-09 NOTE — Telephone Encounter (Signed)
She was seen this morning, was in no distress.  Please call the patient: -See if she had any symptoms of low sugar. Ask her to start checking CBGs twice a day and call me or her endocrinologist w/ readings next week. -Also need to know what medications she is actually taking. This morning she wasn't sure on what she is taking.

## 2016-04-09 NOTE — Telephone Encounter (Signed)
[facility] reporting critical Glucose @ 38

## 2016-04-09 NOTE — Progress Notes (Addendum)
Subjective:    Patient ID: [name] [name], female    DOB: [birth year], 76 y.o.   MRN: [id number]  DOS:  04/09/2016 Type of visit - description : Routine checkup Interval history: We reviewed the events of last year, she had several admissions. Also, lost a son to a drug  overdose, they fight this issue for many years and now that he is gone, her and her husband's  stress level have decreased significantly. She is having a healthy grieving. We did extensive medical reconciliation.  Review of Systems Denies fever chills Edema at baseline No chest pain or major difficulty breathing No recent falls, ambulate using  walker. No cough or sputum production No suicidal ideas No dysuria or gross hematuria  Past Medical History:  Diagnosis Date  . Atrial fibrillation ([place])    SVT dx 2007, cath 2007 mild CAD, then had an cardioversion, ablation; still on coumadin , has occ palpitation, EKG 03-2010 NSR  . Blindness of left eye   . DIABETES MELLITUS, TYPE II 03/27/2006   dr [name]  . Eye muscle weakness    Right eye weakness after cataract surgery  . GERD (gastroesophageal reflux disease) 10/05/2011  . 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 HYPERLIPIDEMIA 03/27/2006  . HYPERTENSION 03/27/2006  . Intertrochanteric fracture of right hip ([place]) 07/13/2012  . 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 Osteopenia 2004   Dexa 2004 showed Osteopenia, DEXA 03/2007 normal  . Osteopenia   . Other chronic cystitis with hematuria   . Recurrent urinary tract infection    Seeing Urology  . 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 Seizures ([place])   . 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[place]) 1/76/1607    Past Surgical History:  Procedure Laterality Date  . Bilateral anke surgery Bilateral   . CARDIAC CATHETERIZATION N/A 05/17/2015   Procedure: Left Heart Cath and Coronary Angiography;  Surgeon: Jettie Booze, MD;  Location: [facility];  Service: Cardiovascular;  Laterality: N/A;  . CARDIAC CATHETERIZATION N/A  05/17/2015   Procedure: Coronary Balloon Angioplasty;  Surgeon: Jettie Booze, MD;  Location: [facility];  Service: Cardiovascular;  Laterality: N/A;  . FEMUR IM NAIL Right 07/15/2012   Procedure: INTRAMEDULLARY (IM) NAIL HIP;  Surgeon: Johnny Bridge, MD;  Location: [facility];  Service: Orthopedics;  Laterality: Right;  . FEMUR IM NAIL Left 12/02/2015   Procedure: INTRAMEDULLARY (IM) NAIL FEMORAL;  Surgeon: [name], MD;  Location: [facility];  Service: Orthopedics;  Laterality: Left;  . INCISION AND DRAINAGE HIP Left 01/03/2016   Procedure: IRRIGATION AND DEBRIDEMENT HIP;  Surgeon: [name], MD;  Location: [facility];  Service: Orthopedics;  Laterality: Left;  . left hip surgery    . SHOULDER SURGERY      Social History   Social History  . Marital status: Married    Spouse name: Jenny Reichmann  . Number of children: 1  . Years of education: College   Occupational History  . retired  Retired   Social History Main Topics  . Smoking status: Former Smoker    Types: Cigarettes    Quit date: 03/02/1978  . Smokeless tobacco: Never Used  . Alcohol use No  . Drug use: No  . Sexual activity: No   Other Topics Concern  . Not on file   Social History Narrative   Patient lives at home spouse. Uses a walker consistently.   Moved from [place] 2004   1 son, problems w/ drugs, passed away 2016-03-03--  OD          Allergies as of 04/09/2016      Reactions   Procaine Hcl Anaphylaxis   Amoxicillin Itching      Medication List       Accurate as of 04/09/16 11:59 PM. Always use your most recent med list.          atorvastatin 80 MG tablet Commonly known as:  LIPITOR Take 80 mg by mouth daily.   calcium-vitamin D 500-200 MG-UNIT tablet Commonly known as:  OSCAL WITH D Take 1 tablet by mouth daily.   clopidogrel 75 MG tablet Commonly known as:  PLAVIX Take 1 tablet (75 mg total) by mouth daily.   escitalopram 20 MG tablet Commonly known as:  LEXAPRO Take 20 mg by mouth daily.     furosemide 40 MG tablet Commonly known as:  LASIX Take 1 tablet (40 mg total) by mouth 2 (two) times daily.   insulin NPH-regular Human (70-30) 100 UNIT/ML injection Commonly known as:  NOVOLIN 70/30 Inject 14-30 Units into the skin See admin instructions. Inject 30 units in the morning. 14 units-2 times daily with lunch and dinnner   levETIRAcetam 500 MG 24 hr tablet Commonly known as:  KEPPRA XR Take 1 tablet (500 mg total) by mouth daily.   metoprolol succinate 50 MG 24 hr tablet Commonly known as:  TOPROL-XL Take 25 mg by mouth daily. Take with or immediately following a meal.   promethazine 12.5 MG tablet Commonly known as:  PHENERGAN TAKE ONE TABLET BY MOUTH EVERY 6 HOURS AS NEEDED FOR NAUSEA AND VOMITING   protein supplement Liqd Take 30 mLs by mouth 2 (two) times daily.   warfarin 2.5 MG tablet Commonly known as:  COUMADIN Take 1 tablet (2.5 mg total) by mouth daily.          Objective:   Physical Exam BP 128/76 (BP Location: Left Arm, Patient Position: Sitting, Cuff Size: Normal)   Pulse 66   Temp 98.1 F (36.7 C) (Oral)   Resp 14   Ht 5\' 9"  (1.753 m)   Wt 203 lb (92.1 kg)   SpO2 96%   BMI 29.98 kg/m  General:   Well developed, well nourished . NAD.  HEENT:  Normocephalic . Face symmetric, atraumatic Lungs:  CTA B Normal respiratory effort, no intercostal retractions, no accessory muscle use. Heart: RRR,  no murmur.  +/+++ pretibial edema bilaterally  Skin: Not pale. Not jaundice Neurologic:  alert & oriented X3.  Speech normal, gait appropriate using a rolling walker. Psych--  Cognition and judgment appear intact.  Cooperative with normal attention span and concentration.  Behavior appropriate. No anxious or depressed appearing.      Assessment & Plan:  Assessment   DM  + retinopathy, Dr [name] HTN Hyperlipidemia anxiety ativan GERD Elevated creatinine(started after admission 05-2015, had IV contrast) Anemia, normal iron 05-2015, never  had a colonoscopy CV ---NSTEMI 05-2015, cath, angiplasty ---Echo, EF 40 % (05-2015) ---Atrial fibrillation, SVT -- dx 2007, cath 2007 mild CAD. 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 [name] HTN:  BP today is very good. Check a BMP. She is not sure if she is taking lisinopril or Lasix. Will let me know Addendum: Not taking lisinopril or Lasix. BMP showed a creatinine of 1.3. Plan: Restart Lasix for now, history of CHF. BMP in 2 weeks, add lisinopril later if possible Hyperlipidemia: Not sure if she's taking Lipitor. Will let me know. Check a FLP Addendum: Not taking Lipitor, recommend to go back on it. Anxiety: Stress level significantly decrease after her son died from a drug overdose. Currently not taking Ativan or Lexapro. Addendum: Taking Lexapro 20 mg 2 tablets daily. Recommend to decrease to one tablet daily. GERD: Currently not an issue, not taking PPIs Addendum: Taking pantoprazole 20 mg per patient. Anemia: Checking labs today. Not taking supplements. Atrial fibrillation: On Coumadin and beta blockers. CAD: Not sure if she is taking Plavix. Will let me know Addendum: Not taking plavix, recommend to restart Seizures: Dilantin levels were undetectable for a while, neurology recommended to take Keppra instead. Patient not sure as to what seizure medication she is taking. Will let me know. No recent seizure  activity. Addendum not taking any seizure medication, will  send a prescription for Keppra Primary care: Screenings discussed. Elected no further screening. RTC 2 months   Today, I spent more than 45   min with the patient: >50% of the time counseling regards medication compliance, education and reconciliation. Reviewing events from last year.

## 2016-04-09 NOTE — Telephone Encounter (Signed)
LMOM informing Pt to return call.  

## 2016-04-09 NOTE — Patient Instructions (Signed)
GO TO THE LAB : Get the blood work     GO TO THE FRONT DESK Schedule your next appointment for a  checkup in 2 months  When you get home, see what medications you are  actually taking and call my nurse to let her know

## 2016-04-10 ENCOUNTER — Telehealth: Payer: Self-pay | Admitting: Internal Medicine

## 2016-04-10 MED ORDER — CLOPIDOGREL BISULFATE 75 MG PO TABS: 75.0000 mg | ORAL_TABLET | Freq: Every day | ORAL | 5 refills | Status: DC

## 2016-04-10 MED ORDER — FUROSEMIDE 40 MG PO TABS: 40.0000 mg | ORAL_TABLET | Freq: Two times a day (BID) | ORAL | 5 refills | Status: DC

## 2016-04-10 MED ORDER — ATORVASTATIN CALCIUM 80 MG PO TABS: 80.0000 mg | ORAL_TABLET | Freq: Every day | ORAL | 5 refills | Status: DC

## 2016-04-10 MED ORDER — ESCITALOPRAM OXALATE 20 MG PO TABS: 20.0000 mg | ORAL_TABLET | Freq: Every day | ORAL | 5 refills | Status: DC

## 2016-04-10 NOTE — Telephone Encounter (Signed)
Spoke w/ Pt, discussed medications w/ her and instructed to take per current med list. Refills sent. BMP scheduled 04/28/2016 at 1030.

## 2016-04-10 NOTE — Assessment & Plan Note (Signed)
DM: Good compliance with medication, refer to Dr. [name] HTN:  BP today is very good. Check a BMP. She is not sure if she is taking lisinopril or Lasix. Will let me know Addendum: Not taking lisinopril or Lasix. BMP showed a creatinine of 1.3. Plan: Restart Lasix for now, history of CHF. BMP in 2 weeks, add lisinopril later if possible Hyperlipidemia: Not sure if she's taking Lipitor. Will let me know. Check a FLP Addendum: Not taking Lipitor, recommend to go back on it. Anxiety: Stress level significantly decrease after her son died from a drug overdose. Currently not taking Ativan or Lexapro. Addendum: Taking Lexapro 20 mg 2 tablets daily. Recommend to decrease to one tablet daily. GERD: Currently not an issue, not taking PPIs Addendum: Taking pantoprazole 20 mg per patient. Anemia: Checking labs today. Not taking supplements. Atrial fibrillation: On Coumadin and beta blockers. CAD: Not sure if she is taking Plavix. Will let me know Addendum: Not taking plavix, recommend to restart Seizures: Dilantin levels were undetectable for a while, neurology recommended to take Keppra instead. Patient not sure as to what seizure medication she is taking. Will let me know. No recent seizure activity. Addendum not taking any seizure medication, will  send a prescription for Keppra Primary care: Screenings discussed. Elected no further screening. RTC 2 months

## 2016-04-10 NOTE — Telephone Encounter (Signed)
thx

## 2016-04-10 NOTE — Telephone Encounter (Addendum)
Advise patient: Recommend to take  Medications according to the current list .  That will include: restart Lasix, Lipitor, Plavix, Keppra, decrease Lexapro to one tablet daily. Call  Refills as needed. Schedule a BMP in 2 weeks. Labs were okay.

## 2016-04-10 NOTE — Telephone Encounter (Signed)
Spoke w/ Pt, she is taking:  OTC Calcium-Vitamin D Lexapro 20mg  po bid Novolin 70/30 Toprol XL 50mg  24hr Pantoprazole 20mg  Phenergan 12.5 mg Coumadin 2.5mg 

## 2016-04-10 NOTE — Telephone Encounter (Signed)
Spoke w/ Pt, she denies symptoms of low blood sugar, instructed to start checking blood sugars twice daily and let either [place] or endo know of reading next week. Discussed medications see OV note 04/10/2016.

## 2016-04-10 NOTE — Telephone Encounter (Addendum)
Relation to JQ:DUKR Call back [phone]   Reason for call:  Patient would like to discuss current medication list, please advise

## 2016-04-13 ENCOUNTER — Telehealth: Payer: Self-pay | Admitting: Internal Medicine

## 2016-04-13 ENCOUNTER — Emergency Department (HOSPITAL_BASED_OUTPATIENT_CLINIC_OR_DEPARTMENT_OTHER): Payer: Medicare Other

## 2016-04-13 ENCOUNTER — Emergency Department (HOSPITAL_BASED_OUTPATIENT_CLINIC_OR_DEPARTMENT_OTHER)
Admission: EM | Admit: 2016-04-13 | Discharge: 2016-04-13 | Disposition: A | Discharge status: Home / Self Care | Payer: Medicare Other | Attending: Emergency Medicine | Admitting: Emergency Medicine

## 2016-04-13 ENCOUNTER — Encounter (HOSPITAL_BASED_OUTPATIENT_CLINIC_OR_DEPARTMENT_OTHER): Payer: Self-pay | Admitting: Emergency Medicine

## 2016-04-13 ENCOUNTER — Ambulatory Visit (INDEPENDENT_AMBULATORY_CARE_PROVIDER_SITE_OTHER): Payer: Medicare Other | Admitting: *Deleted

## 2016-04-13 DIAGNOSIS — Z87891 Personal history of nicotine dependence: Secondary | ICD-10-CM | POA: Insufficient documentation

## 2016-04-13 DIAGNOSIS — Z79899 Other long term (current) drug therapy: Secondary | ICD-10-CM | POA: Diagnosis not present

## 2016-04-13 DIAGNOSIS — N183 Chronic kidney disease, stage 3 (moderate): Secondary | ICD-10-CM | POA: Diagnosis not present

## 2016-04-13 DIAGNOSIS — Z794 Long term (current) use of insulin: Secondary | ICD-10-CM | POA: Diagnosis not present

## 2016-04-13 DIAGNOSIS — I5023 Acute on chronic systolic (congestive) heart failure: Secondary | ICD-10-CM | POA: Insufficient documentation

## 2016-04-13 DIAGNOSIS — I2510 Atherosclerotic heart disease of native coronary artery without angina pectoris: Secondary | ICD-10-CM

## 2016-04-13 DIAGNOSIS — I130 Hypertensive heart and chronic kidney disease with heart failure and stage 1 through stage 4 chronic kidney disease, or unspecified chronic kidney disease: Secondary | ICD-10-CM | POA: Insufficient documentation

## 2016-04-13 DIAGNOSIS — Z7901 Long term (current) use of anticoagulants: Secondary | ICD-10-CM | POA: Diagnosis not present

## 2016-04-13 DIAGNOSIS — E1122 Type 2 diabetes mellitus with diabetic chronic kidney disease: Secondary | ICD-10-CM | POA: Insufficient documentation

## 2016-04-13 DIAGNOSIS — I4891 Unspecified atrial fibrillation: Secondary | ICD-10-CM

## 2016-04-13 DIAGNOSIS — R0602 Shortness of breath: Secondary | ICD-10-CM | POA: Diagnosis present

## 2016-04-13 DIAGNOSIS — I4892 Unspecified atrial flutter: Secondary | ICD-10-CM | POA: Diagnosis not present

## 2016-04-13 DIAGNOSIS — Z5181 Encounter for therapeutic drug level monitoring: Secondary | ICD-10-CM

## 2016-04-13 LAB — BASIC METABOLIC PANEL
ANION GAP: 10 (ref 5–15)
BUN: 21 mg/dL — ABNORMAL HIGH (ref 6–20)
CALCIUM: 9 mg/dL (ref 8.9–10.3)
CO2: 24 mmol/L (ref 22–32)
Chloride: 105 mmol/L (ref 101–111)
Creatinine, Ser: 1.53 mg/dL — ABNORMAL HIGH (ref 0.44–1.00)
GFR, EST AFRICAN AMERICAN: 38 mL/min — AB (ref >60)
GFR, EST NON AFRICAN AMERICAN: 32 mL/min — AB (ref >60)
Glucose, Bld: 121 mg/dL — ABNORMAL HIGH (ref 65–99)
POTASSIUM: 3.8 mmol/L (ref 3.5–5.1)
Sodium: 139 mmol/L (ref 135–145)

## 2016-04-13 LAB — CBC WITH DIFFERENTIAL/PLATELET
BASOS ABS: 0 10*3/uL (ref 0.0–0.1)
BASOS PCT: 0 %
Eosinophils Absolute: 0.1 10*3/uL (ref 0.0–0.7)
Eosinophils Relative: 1 %
HEMATOCRIT: 34.1 % — AB (ref 36.0–46.0)
HEMOGLOBIN: 11.2 g/dL — AB (ref 12.0–15.0)
LYMPHS PCT: 29 %
Lymphs Abs: 1.9 10*3/uL (ref 0.7–4.0)
MCH: 30.4 pg (ref 26.0–34.0)
MCHC: 32.8 g/dL (ref 30.0–36.0)
MCV: 92.7 fL (ref 78.0–100.0)
MONOS PCT: 10 %
Monocytes Absolute: 0.7 10*3/uL (ref 0.1–1.0)
NEUTROS ABS: 3.9 10*3/uL (ref 1.7–7.7)
NEUTROS PCT: 60 %
Platelets: 285 10*3/uL (ref 150–400)
RBC: 3.68 MIL/uL — ABNORMAL LOW (ref 3.87–5.11)
RDW: 14.1 % (ref 11.5–15.5)
WBC: 6.6 10*3/uL (ref 4.0–10.5)

## 2016-04-13 LAB — TROPONIN I

## 2016-04-13 LAB — BRAIN NATRIURETIC PEPTIDE: B Natriuretic Peptide: 1410.4 pg/mL — ABNORMAL HIGH (ref 0.0–100.0)

## 2016-04-13 LAB — POCT INR: INR: 3

## 2016-04-13 MED ORDER — FUROSEMIDE 10 MG/ML IJ SOLN
60.0000 mg | Freq: Once | INTRAMUSCULAR | Status: AC
  Administered 2016-04-13: 60 mg via INTRAVENOUS
  Filled 2016-04-13: qty 6

## 2016-04-13 NOTE — ED Notes (Signed)
Pt in xray prior to triage.

## 2016-04-13 NOTE — Telephone Encounter (Signed)
Called patient. She reports she started taking Lexapro last night. She had some leftover from a previous prescription and accidentally took old dose (40 mg) and new dose (20 mg) for a total of 60 mg yesterday, then took only 20 mg as prescribed today. Since yesterday, she has been experiencing intermittent chest pain that feels like "somebody sitting across my chest" and SOB at rest. "I have to stop and force myself to take a deep breath." She denies allergic symptoms of angioedema, itching, hives, and rash. Also denies one-sided weakness, numbness, blurred vision, and HA. She is alert and able to speak in complete sentences over the phone. Given subjective SOB at rest and her descriptors of her CP, advised pt to go to [facility] now. Patient and husband agreed to instructions.

## 2016-04-13 NOTE — ED Notes (Signed)
Patient denies any recent weight gain or swelling noted.

## 2016-04-13 NOTE — ED Triage Notes (Signed)
Patient reports that she saw dr. Larose Kells 2 days ago and was restarted on some of her medications - the patient reports that she became acutly SOB today - reports that she started her lasix back today. She is unsure why she was taken off of it.

## 2016-04-13 NOTE — ED Notes (Signed)
IV attempt unsuccessful, 20g R upper arm, 22g R shoulder. Primary RN notified

## 2016-04-13 NOTE — Telephone Encounter (Signed)
Noted, thx.

## 2016-04-13 NOTE — Telephone Encounter (Signed)
Can you call to triage? Thank you.

## 2016-04-13 NOTE — Telephone Encounter (Signed)
Caller name:john Relation to [id number] Call back number: [phone] Pharmacy:  Reason for call: pt's husband would like a call aback asap, states its in regards to the new medication the pt has started to take, states she is having some effects from it and need to make dr. Larose Kells aware

## 2016-04-13 NOTE — ED Provider Notes (Signed)
[facility] DEPT [facility] Provider Note   CSN: [id number] Arrival date & time: 04/13/16  1504  By signing my name below, I, Reola Mosher, attest that this documentation has been prepared under the direction and in the presence of Leo Grosser, MD. Electronically Signed: Reola Mosher, ED Scribe. 04/13/16. 4:33 PM.  History   Chief Complaint Chief Complaint  Patient presents with  . Shortness of Breath   The history is provided by the patient. No language interpreter was used.  Shortness of Breath  This is a recurrent problem. The average episode lasts 2 days. The problem occurs intermittently.The current episode started 6 to 12 hours ago. The problem has been gradually worsening. Associated symptoms include leg swelling. Pertinent negatives include no fever and no chest pain. The problem's precipitants include medical treatment. She has tried nothing for the symptoms. She has had prior hospitalizations. She has had prior ED visits. Associated medical issues include CAD, heart failure and past MI.    HPI Comments: [name] [name] is a 76 y.o. female with a h/o CHF, AFIB, DM, CAD, and prior NSTEMI, who presents to the Emergency Department complaining of gradual onset, persistent shortness of breath which began this morning. Pt reports that she began taking Lexapro yesterday and following taking a dosage her shortness of breath began. It resolved moderately yesterday, however, after taking another dosage this morning her shortness of breath returned. No other medication changes. Pt also reports associated lower leg swelling, but notes that this has been an issue for several weeks and has been improving. She also notes associated chest congestion. Pt has previously taken this medication in the distant past with similar symptoms shortness of breath. She is currently on Lasix and has been compliant with this medication. Pt denies chest pain, cough, fever, or any other associated  symptoms.   Past Medical History:  Diagnosis Date  . Atrial fibrillation ([place])    SVT dx 2007, cath 2007 mild CAD, then had an cardioversion, ablation; still on coumadin , has occ palpitation, EKG 03-2010 NSR  . Blindness of left eye   . DIABETES MELLITUS, TYPE II 03/27/2006   dr [name]  . Eye muscle weakness    Right eye weakness after cataract surgery  . GERD (gastroesophageal reflux disease) 10/05/2011  . 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 HYPERLIPIDEMIA 03/27/2006  . HYPERTENSION 03/27/2006  . Intertrochanteric fracture of right hip ([place]) 07/13/2012  . 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 Osteopenia 2004   Dexa 2004 showed Osteopenia, DEXA 03/2007 normal  . Osteopenia   . Other chronic cystitis with hematuria   . Recurrent urinary tract infection    Seeing Urology  . 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 Seizures ([place])   . 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[place]) 07/05/3974   Patient Active Problem List   Diagnosis Date Noted  . Pruritus 02/18/2016  . Decreased strength of lower extremity   . Gram-negative infection   . CKD (chronic kidney disease) stage 3, GFR 30-59 ml/min   . Postoperative anemia due to acute blood loss 01/04/2016  . Left hip postoperative wound infection 01/02/2016  . Closed intertrochanteric fracture of hip, left, sequela 12/25/2015  . Anemia 12/01/2015  . Ankle fracture, left 09/17/2015  . Ankle fracture, bimalleolar, closed   . Bilateral fibular fractures 09/14/2015  . Hypertensive heart disease 05/20/2015  . CAD (coronary artery disease) 05/20/2015  . Acute systolic CHF (congestive heart failure) ([place]) 05/20/2015  . Anticoagulated on Coumadin 05/20/2015  . Type 2  diabetes mellitus with vascular disease ([place]) 05/17/2015  . NSTEMI (non-ST elevated myocardial infarction) ([place])   . Bacteremia due to Escherichia coli 05/11/2015  . Acute kidney injury superimposed on chronic kidney disease ([place]) 05/11/2015  . Uncontrolled hypertension 05/11/2015  . 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[place])  16/03/930  . PCP NOTES >>>>>>>>>>>>>>>> 12/28/2014  . Depression 05/17/2014  . Memory loss 05/17/2014  . Anxiety and depression, PCP notes  11/24/2013  . Severe obesity (BMI >= 40) ([place]) 07/14/2013  . Encounter for therapeutic drug monitoring 06/02/2013  . Dilantin toxicity 11/21/2012  . History of encephalitis 11/18/2012  . Localization-related (focal) (partial) epilepsy and epileptic syndromes with complex partial seizures, without mention of intractable epilepsy 09/27/2012  . Intertrochanteric fracture of right hip ([place]) 07/13/2012  . DM (diabetes mellitus) ([place]) 01/19/2012  . GERD (gastroesophageal reflux disease) 10/05/2011  . Shoulder pain 08/27/2011  . Seizure disorder ([place]) 05/24/2011  . Annual physical exam 02/10/2011  . Atrial fibrillation ([place])   . 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 Hyperlipidemia 03/27/2006  . Hereditary and idiopathic peripheral neuropathy 03/27/2006  . 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 Essential hypertension 03/27/2006  . OSTEOPENIA 03/27/2006   Past Surgical History:  Procedure Laterality Date  . Bilateral anke surgery Bilateral   . CARDIAC CATHETERIZATION N/A 05/17/2015   Procedure: Left Heart Cath and Coronary Angiography;  Surgeon: Jettie Booze, MD;  Location: [facility];  Service: Cardiovascular;  Laterality: N/A;  . CARDIAC CATHETERIZATION N/A 05/17/2015   Procedure: Coronary Balloon Angioplasty;  Surgeon: Jettie Booze, MD;  Location: [facility];  Service: Cardiovascular;  Laterality: N/A;  . FEMUR IM NAIL Right 07/15/2012   Procedure: INTRAMEDULLARY (IM) NAIL HIP;  Surgeon: Johnny Bridge, MD;  Location: [facility];  Service: Orthopedics;  Laterality: Right;  . FEMUR IM NAIL Left 12/02/2015   Procedure: INTRAMEDULLARY (IM) NAIL FEMORAL;  Surgeon: [name], MD;  Location: [facility];  Service: Orthopedics;  Laterality: Left;  . INCISION AND DRAINAGE HIP Left 01/03/2016   Procedure: IRRIGATION AND DEBRIDEMENT HIP;  Surgeon:  [name], MD;  Location: [facility];  Service: Orthopedics;  Laterality: Left;  . left hip surgery    . SHOULDER SURGERY     OB History    No data available     Home Medications    Prior to Admission medications   Medication Sig Start Date End Date Taking? Authorizing Provider  atorvastatin (LIPITOR) 80 MG tablet Take 1 tablet (80 mg total) by mouth daily. 04/10/16   Colon Branch, MD  calcium-vitamin D (OSCAL WITH D) 500-200 MG-UNIT per tablet Take 1 tablet by mouth daily.    Historical Provider, MD  clopidogrel (PLAVIX) 75 MG tablet Take 1 tablet (75 mg total) by mouth daily. 04/10/16   Colon Branch, MD  escitalopram (LEXAPRO) 20 MG tablet Take 1 tablet (20 mg total) by mouth daily. 04/10/16   Colon Branch, MD  furosemide (LASIX) 40 MG tablet Take 1 tablet (40 mg total) by mouth 2 (two) times daily. 04/10/16   Colon Branch, MD  insulin NPH-regular Human (NOVOLIN 70/30) (70-30) 100 UNIT/ML injection Inject 14-30 Units into the skin See admin instructions. Inject 30 units in the morning. 14 units-2 times daily with lunch and dinnner    Historical Provider, MD  levETIRAcetam (KEPPRA XR) 500 MG 24 hr tablet Take 1 tablet (500 mg total) by mouth daily. Patient not taking: Reported on 03/03/2016 12/24/15   Garvin Fila, MD  metoprolol  succinate (TOPROL-XL) 50 MG 24 hr tablet Take 25 mg by mouth daily. Take with or immediately following a meal.    Historical Provider, MD  pantoprazole (PROTONIX) 20 MG tablet Take 20 mg by mouth daily.    Historical Provider, MD  promethazine (PHENERGAN) 12.5 MG tablet TAKE ONE TABLET BY MOUTH EVERY 6 HOURS AS NEEDED FOR NAUSEA AND VOMITING 03/30/16   Colon Branch, MD  protein supplement (PROSOURCE NO CARB) LIQD Take 30 mLs by mouth 2 (two) times daily.    Historical Provider, MD  warfarin (COUMADIN) 2.5 MG tablet Take 1 tablet (2.5 mg total) by mouth daily. 02/10/16   Evans Lance, MD   Family History Family History  Problem Relation Age of Onset  . Diabetes Father   . Heart  attack Father 66  . Diabetes Sister   . Drug abuse Son   . Cancer Neg Hx     no hx of colon or breast cancer   Social History Social History  Substance Use Topics  . Smoking status: Former Smoker    Types: Cigarettes    Quit date: 03/02/1978  . Smokeless tobacco: Never Used  . Alcohol use No   Allergies   Procaine hcl and Amoxicillin  Review of Systems Review of Systems  Constitutional: Negative for fever.  Respiratory: Positive for shortness of breath.   Cardiovascular: Positive for leg swelling. Negative for chest pain.  All other systems reviewed and are negative.  Physical Exam Updated Vital Signs BP 186/86 (BP Location: Right Arm)   Pulse (!) 55   Temp 97.8 F (36.6 C) (Oral)   Resp 18   Ht 5\' 9"  (1.753 m)   Wt 203 lb (92.1 kg)   SpO2 98% Comment: RA 90 % with ambulation and SOB   BMI 29.98 kg/m   Physical Exam  Constitutional: She is oriented to person, place, and time. She appears well-developed and well-nourished. No distress.  HENT:  Head: Normocephalic.  Nose: Nose normal.  Eyes: Conjunctivae are normal.  Neck: Neck supple. No tracheal deviation present.  Cardiovascular: Normal rate, regular rhythm, S1 normal, S2 normal and normal heart sounds.  Exam reveals no gallop and no friction rub.   No murmur heard. Pulmonary/Chest: Effort normal and breath sounds normal. No respiratory distress. She has no decreased breath sounds. She has no wheezes. She has no rhonchi. She has no rales.  No stridor.   Abdominal: Soft. She exhibits no distension.  Musculoskeletal: She exhibits edema.  Neurological: She is alert and oriented to person, place, and time.  Skin: Skin is warm and dry.  Psychiatric: She has a normal mood and affect.  Nursing note and vitals reviewed.  ED Treatments / Results  DIAGNOSTIC STUDIES: Oxygen Saturation is 98% on RA, normal by my interpretation.   COORDINATION OF CARE: 4:33 PM-Discussed next steps with pt. Pt verbalized understanding  and is agreeable with the plan.   Labs (all labs ordered are listed, but only abnormal results are displayed) Labs Reviewed  BASIC METABOLIC PANEL - Abnormal; Notable for the following:       Result Value   Glucose, Bld 121 (*)    BUN 21 (*)    Creatinine, Ser 1.53 (*)    GFR calc non Af Amer 32 (*)    GFR calc Af Amer 38 (*)    All other components within normal limits  CBC WITH DIFFERENTIAL/PLATELET - Abnormal; Notable for the following:    RBC 3.68 (*)    Hemoglobin  11.2 (*)    HCT 34.1 (*)    All other components within normal limits  BRAIN NATRIURETIC PEPTIDE - Abnormal; Notable for the following:    B Natriuretic Peptide 1,410.4 (*)    All other components within normal limits  TROPONIN I   EKG  EKG Interpretation  Date/Time:  Monday April 13 2016 15:36:25 EST Ventricular Rate:  56 PR Interval:    QRS Duration: 111 QT Interval:  494 QTC Calculation: 477 R Axis:   44 Text Interpretation:  Sinus rhythm Borderline T abnormalities, anterior leads Since last tracing rate slower Otherwise no significant change Confirmed by Tremel Setters MD, Keyoni Lapinski [phone]) on 04/13/2016 4:56:40 PM      Radiology Dg Chest 2 View  Result Date: 04/13/2016 CLINICAL DATA:  Shortness of breath for the past 2 days. History of hypertension, CHF, atrial fibrillation, coronary artery disease, former smoker. EXAM: CHEST  2 VIEW COMPARISON:  Portable chest x-ray of January 02, 2016 FINDINGS: The lungs are mildly hyperinflated. There small bilateral pleural effusions. The interstitial markings are increased. The pulmonary vascularity is engorged. The cardiac silhouette is enlarged. There is calcification in the wall of the aortic arch. The trachea is midline. There surgical clips in the right suprahilar region and right paratracheal region. IMPRESSION: CHF superimposed upon COPD. No acute pneumonia. Small bilateral pleural effusions. Thoracic aortic atherosclerosis. Electronically Signed   By: David  Martinique M.D.    On: 04/13/2016 15:33   Procedures Procedures   Medications Ordered in ED Medications  furosemide (LASIX) injection 60 mg (60 mg Intravenous Given 04/13/16 1738)    Initial Impression / Assessment and Plan / ED Course  I have reviewed the triage vital signs and the nursing notes.  Pertinent labs & imaging results that were available during my care of the patient were reviewed by me and considered in my medical decision making (see chart for details).     76 y.o. female presents with shortness of breath yesterday that has been ongoing. No hypoxemia or significant VS abnormalities. Has evidence of excess fluid on CXR but well appearing. Began diuresing here with IV lasix. She relates her symptoms to starting lexapro so I recommended she discontinue and f/u with her PCP to determine need for further diuresis and whether or not to continue this medication. No troponin elevation or chest pain so doubt ACS. Plan to follow up with PCP as needed and return precautions discussed for worsening or new concerning symptoms.   Final Clinical Impressions(s) / ED Diagnoses   Final diagnoses:  Shortness of breath  Acute on chronic systolic congestive heart failure (HCC)   New Prescriptions New Prescriptions   No medications on file   I personally performed the services described in this documentation, which was scribed in my presence. The recorded information has been reviewed and is accurate.     Leo Grosser, MD 04/14/16 [phone]

## 2016-04-16 ENCOUNTER — Ambulatory Visit (INDEPENDENT_AMBULATORY_CARE_PROVIDER_SITE_OTHER): Payer: Medicare Other | Admitting: Internal Medicine

## 2016-04-16 ENCOUNTER — Encounter: Payer: Self-pay | Admitting: Internal Medicine

## 2016-04-16 VITALS — BP 124/78 | HR 59 | Temp 97.5°F | Resp 14 | Ht 69.0 in | Wt 203.0 lb

## 2016-04-16 DIAGNOSIS — I5021 Acute systolic (congestive) heart failure: Secondary | ICD-10-CM | POA: Diagnosis not present

## 2016-04-16 DIAGNOSIS — E785 Hyperlipidemia, unspecified: Secondary | ICD-10-CM | POA: Diagnosis not present

## 2016-04-16 DIAGNOSIS — F418 Other specified anxiety disorders: Secondary | ICD-10-CM

## 2016-04-16 DIAGNOSIS — F329 Major depressive disorder, single episode, unspecified: Secondary | ICD-10-CM

## 2016-04-16 DIAGNOSIS — I10 Essential (primary) hypertension: Secondary | ICD-10-CM | POA: Diagnosis not present

## 2016-04-16 DIAGNOSIS — G40909 Epilepsy, unspecified, not intractable, without status epilepticus: Secondary | ICD-10-CM

## 2016-04-16 DIAGNOSIS — F419 Anxiety disorder, unspecified: Secondary | ICD-10-CM

## 2016-04-16 DIAGNOSIS — I2510 Atherosclerotic heart disease of native coronary artery without angina pectoris: Secondary | ICD-10-CM | POA: Diagnosis not present

## 2016-04-16 DIAGNOSIS — F32A Depression, unspecified: Secondary | ICD-10-CM

## 2016-04-16 NOTE — Progress Notes (Signed)
Pre visit review using our clinic review tool, if applicable. No additional management support is needed unless otherwise documented below in the visit note. 

## 2016-04-16 NOTE — Patient Instructions (Signed)
   GO TO THE FRONT DESK Schedule your next appointment for a  routine checkup in 6 weeks  Come back in one week for blood work only (04/23/2016 at  10.30 a.m.)   ====  Take medications according to the list provided today  I recommend you to go back on escitalopram 20 mg: Half tablet a day, stop it in  3 weeks if that is what you like to do  Weight  yourself everyday at home, call if your weight increase more than 4 pounds in one week.

## 2016-04-16 NOTE — Progress Notes (Signed)
Subjective:    Patient ID: [name] [name], female    DOB: Mar 17, 1940, 76 y.o.   MRN: [id number]  DOS:  04/16/2016 Type of visit - description : ER f/u Interval history: Went to the ER 2- 12- 2018 with difficulty breathing, leg swelling. No chest pain.    Chest x-ray with changes consistent with CHF, creatinine 1.53, CBC at baseline, BNP quite elevated but not worse than before, troponin negative, was given Lasix 60 mg IV Is here for follow-up For some reason the patient thinks the symptoms were related to Lexapro.  Wt Readings from Last 3 Encounters:  04/16/16 203 lb (92.1 kg)  04/13/16 203 lb (92.1 kg)  04/09/16 203 lb (92.1 kg)     Review of Systems Since she left the hospital, is taking her medications as prescribed No further difficulty breathing. No chest pain, edema has decreased. "I feel great" Appetite is very good, no nausea, vomiting, diarrhea   Past Medical History:  Diagnosis Date  . Atrial fibrillation ([place])    SVT dx 2007, cath 2007 mild CAD, then had an cardioversion, ablation; still on coumadin , has occ palpitation, EKG 03-2010 NSR  . Blindness of left eye   . DIABETES MELLITUS, TYPE II 03/27/2006   dr [name]  . Eye muscle weakness    Right eye weakness after cataract surgery  . GERD (gastroesophageal reflux disease) 10/05/2011  . 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 HYPERLIPIDEMIA 03/27/2006  . HYPERTENSION 03/27/2006  . Intertrochanteric fracture of right hip ([place]) 07/13/2012  . 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 Osteopenia 2004   Dexa 2004 showed Osteopenia, DEXA 03/2007 normal  . Osteopenia   . Other chronic cystitis with hematuria   . Recurrent urinary tract infection    Seeing Urology  . 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 Seizures ([place])   . 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[place]) 2/75/1700    Past Surgical History:  Procedure Laterality Date  . Bilateral anke surgery Bilateral   . CARDIAC CATHETERIZATION N/A 05/17/2015   Procedure: Left Heart Cath  and Coronary Angiography;  Surgeon: Jettie Booze, MD;  Location: [facility];  Service: Cardiovascular;  Laterality: N/A;  . CARDIAC CATHETERIZATION N/A 05/17/2015   Procedure: Coronary Balloon Angioplasty;  Surgeon: Jettie Booze, MD;  Location: [facility];  Service: Cardiovascular;  Laterality: N/A;  . FEMUR IM NAIL Right 07/15/2012   Procedure: INTRAMEDULLARY (IM) NAIL HIP;  Surgeon: Johnny Bridge, MD;  Location: [facility];  Service: Orthopedics;  Laterality: Right;  . FEMUR IM NAIL Left 12/02/2015   Procedure: INTRAMEDULLARY (IM) NAIL FEMORAL;  Surgeon: [name], MD;  Location: [facility];  Service: Orthopedics;  Laterality: Left;  . INCISION AND DRAINAGE HIP Left 01/03/2016   Procedure: IRRIGATION AND DEBRIDEMENT HIP;  Surgeon: [name], MD;  Location: [facility];  Service: Orthopedics;  Laterality: Left;  . left hip surgery    . SHOULDER SURGERY      Social History   Social History  . Marital status: Married    Spouse name: Jenny Reichmann  . Number of children: 1  . Years of education: College   Occupational History  . retired  Retired   Social History Main Topics  . Smoking status: Former Smoker    Types: Cigarettes    Quit date: 03/02/1978  . Smokeless tobacco: Never Used  . Alcohol use No  . Drug use: No  . Sexual activity: No   Other Topics Concern  .  Not on file   Social History Narrative   Patient lives at home spouse. Uses a walker consistently.   Moved from [place] 2004   1 son, problems w/ drugs, passed away Mar 20, 2016-- OD          Allergies as of 04/16/2016      Reactions   Procaine Hcl Anaphylaxis   Amoxicillin Itching      Medication List       Accurate as of 04/16/16 11:59 PM. Always use your most recent med list.          atorvastatin 80 MG tablet Commonly known as:  LIPITOR Take 1 tablet (80 mg total) by mouth daily.   calcium-vitamin D 500-200 MG-UNIT tablet Commonly known as:  OSCAL WITH D Take 1 tablet by mouth daily.     clopidogrel 75 MG tablet Commonly known as:  PLAVIX Take 1 tablet (75 mg total) by mouth daily.   escitalopram 20 MG tablet Commonly known as:  LEXAPRO Take 10 mg by mouth daily.   furosemide 40 MG tablet Commonly known as:  LASIX Take 1 tablet (40 mg total) by mouth 2 (two) times daily.   insulin NPH-regular Human (70-30) 100 UNIT/ML injection Commonly known as:  NOVOLIN 70/30 Inject 14-30 Units into the skin See admin instructions. Inject 30 units in the morning. 14 units-2 times daily with lunch and dinnner   levETIRAcetam 500 MG 24 hr tablet Commonly known as:  KEPPRA XR Take 1 tablet (500 mg total) by mouth daily.   metoprolol succinate 50 MG 24 hr tablet Commonly known as:  TOPROL-XL Take 25 mg by mouth daily. Take with or immediately following a meal.   pantoprazole 20 MG tablet Commonly known as:  PROTONIX Take 20 mg by mouth daily.   promethazine 12.5 MG tablet Commonly known as:  PHENERGAN TAKE ONE TABLET BY MOUTH EVERY 6 HOURS AS NEEDED FOR NAUSEA AND VOMITING   protein supplement Liqd Take 30 mLs by mouth 2 (two) times daily.   warfarin 2.5 MG tablet Commonly known as:  COUMADIN Take 1 tablet (2.5 mg total) by mouth daily.          Objective:   Physical Exam BP 124/78 (BP Location: Left Arm, Patient Position: Sitting, Cuff Size: Normal)   Pulse (!) 59   Temp 97.5 F (36.4 C) (Oral)   Resp 14   Ht 5\' 9"  (1.753 m)   Wt 203 lb (92.1 kg)   SpO2 99%   BMI 29.98 kg/m  General:   Well developed, well nourished . NAD.  HEENT:  Normocephalic . Face symmetric, atraumatic Neck: No JVD Lungs:  CTA B Normal respiratory effort, no intercostal retractions, no accessory muscle use. Heart: RRR,  no murmur.  No pretibial edema bilaterally  Skin: Not pale. Not jaundice Neurologic:  alert & oriented X3.  Speech normal, gait appropriate for age and unassisted Psych--  Cognition and judgment appear intact.  Cooperative with normal attention span and  concentration.  Behavior appropriate. No anxious or depressed appearing.      Assessment & Plan:   Assessment   DM  + retinopathy, Dr [name] HTN Hyperlipidemia anxiety ativan GERD Elevated creatinine(started after admission 05-2015, had IV contrast) Anemia, normal iron 05-2015, never had a colonoscopy CV ---NSTEMI 05-2015, cath, angiplasty ---Echo, EF 40 % (05-2015) ---Atrial fibrillation, SVT -- dx 2007, cath 2007 mild CAD. S/p cardioversion, then ablation, on coumadin, last visit cards 2014  Osteopenia GU Urinary retention /UTI 05-2015, had a foley temporarily,  saw urology, PVR 249 cc (high), rx timed voiding Neuro: -Herpes encephalitis 2014 -Seizures (after encephalitis), sz episode 08-2015 (?) - HAs Blindness, left eye Right eye weakness after cataract surgery Osteopenia per DEXA 2004, DEXA 2009 normal H/o lung bx 2008 benign  05-2015- admitted [facility] sepsis  08-2015 admitted  B tibular Fx, no surgery, SNF 12-2015:   admitted, hid FX , surgery 01-2016: admitted, hip surgery infex  PLAN: CHF: Status post ER visit due to edema-SOB, had a CHF exacerbation, got IV Lasix, now back on Lasix 40 mg twice a day. Feeling much improved Plan: Continue present care, BMP in one week. Consider restart lisinopril at some point. Daily weights encourage. HTN: controlled Hyperlipidemia: Back on Lipitor CAD: Back on Plavix Anxiety: at last visit, she was taking Lexapro 20 mg 2 tablets a day, for some reason she thinks SOB is d/t SSRIs so she stopped it. Going from 40 mg a day to NO Lexapro will cause s/e, recommend to take Lexapro 20 mg half tablet daily for 3 weeks then stop it so desired. Seizure disorder: Back on Keppra RTC 6 weeks, BMP 1 week

## 2016-04-17 ENCOUNTER — Telehealth: Payer: Self-pay | Admitting: Internal Medicine

## 2016-04-17 NOTE — Telephone Encounter (Signed)
LMOM asked for a call back 

## 2016-04-17 NOTE — Telephone Encounter (Signed)
Caller name:Koppelman,[name] Relation to UQ:[id number]  Call back number:[phone]   Reason for call:  Spouse would like nurse to call regarding escitalopram (LEXAPRO) 20 MG tablet, stating naseua concerns have not improved, please advise

## 2016-04-17 NOTE — Telephone Encounter (Signed)
Please advise 

## 2016-04-17 NOTE — Assessment & Plan Note (Signed)
CHF: Status post ER visit due to edema-SOB, had a CHF exacerbation, got IV Lasix, now back on Lasix 40 mg twice a day. Feeling much improved Plan: Continue present care, BMP in one week. Consider restart lisinopril at some point. Daily weights encourage. HTN: controlled Hyperlipidemia: Back on Lipitor CAD: Back on Plavix Anxiety: at last visit, she was taking Lexapro 20 mg 2 tablets a day, for some reason she thinks SOB is d/t SSRIs so she stopped it. Going from 40 mg a day to NO Lexapro will cause s/e, recommend to take Lexapro 20 mg half tablet daily for 3 weeks then stop it so desired. Seizure disorder: Back on Keppra RTC 6 weeks, BMP 1 week

## 2016-04-17 NOTE — Telephone Encounter (Signed)
Patient returning call.

## 2016-04-18 NOTE — Telephone Encounter (Signed)
Spoke with the patient, she has mild stomach ache, not a new symptom, not worse, no blood in the stools or diarrhea. Thinks related to Lexapro. We agree on the stop Lexapro, he will call if  pain is not gone or if she has other symptoms.

## 2016-04-20 ENCOUNTER — Ambulatory Visit (INDEPENDENT_AMBULATORY_CARE_PROVIDER_SITE_OTHER): Payer: Medicare Other | Admitting: *Deleted

## 2016-04-20 DIAGNOSIS — I4892 Unspecified atrial flutter: Secondary | ICD-10-CM

## 2016-04-20 DIAGNOSIS — Z7901 Long term (current) use of anticoagulants: Secondary | ICD-10-CM | POA: Diagnosis not present

## 2016-04-20 DIAGNOSIS — I4891 Unspecified atrial fibrillation: Secondary | ICD-10-CM

## 2016-04-20 DIAGNOSIS — Z5181 Encounter for therapeutic drug level monitoring: Secondary | ICD-10-CM

## 2016-04-20 DIAGNOSIS — I2510 Atherosclerotic heart disease of native coronary artery without angina pectoris: Secondary | ICD-10-CM

## 2016-04-20 LAB — POCT INR: INR: 2.2

## 2016-04-23 ENCOUNTER — Telehealth: Payer: Self-pay | Admitting: *Deleted

## 2016-04-23 ENCOUNTER — Other Ambulatory Visit (INDEPENDENT_AMBULATORY_CARE_PROVIDER_SITE_OTHER): Payer: Medicare Other

## 2016-04-23 ENCOUNTER — Telehealth: Payer: Self-pay | Admitting: Internal Medicine

## 2016-04-23 DIAGNOSIS — I5021 Acute systolic (congestive) heart failure: Secondary | ICD-10-CM

## 2016-04-23 LAB — BASIC METABOLIC PANEL
BUN: 22 mg/dL (ref 6–23)
CALCIUM: 9.5 mg/dL (ref 8.4–10.5)
CO2: 31 meq/L (ref 19–32)
CREATININE: 1.47 mg/dL — AB (ref 0.40–1.20)
Chloride: 106 mEq/L (ref 96–112)
GFR: 36.89 mL/min — ABNORMAL LOW (ref >60.00)
GLUCOSE: 46 mg/dL — AB (ref 70–99)
Potassium: 4.6 mEq/L (ref 3.5–5.1)
Sodium: 141 mEq/L (ref 135–145)

## 2016-04-23 NOTE — Telephone Encounter (Signed)
CRITICAL VALUE STICKER  CRITICAL VALUE:Glucose:46  RECEIVER (on-site recipient of call):[place] NOTIFIED:04/23/16 @ 2:12pm  MESSENGER (representative from lab):Hope  MD NOTIFIED:Dr. Larose Kells   TIME OF NOTIFICATION:2:15pm  RESPONSE:Dr. Larose Kells asked if I would send him the message.

## 2016-04-23 NOTE — Telephone Encounter (Signed)
°  [place], [place] [phone] (Phone) [phone] (Fax)     Patient currently at pharmacy, pharmacy in need of clarification regarding warfarin (COUMADIN) 2.5 MG tablet and clopidogrel (PLAVIX) 75 MG tablet medication interaction, please advise

## 2016-04-23 NOTE — Telephone Encounter (Signed)
This is not the first time she has hypoglycemia but is asymptomatic. We'll forward this message to endocrinology.

## 2016-04-23 NOTE — Telephone Encounter (Signed)
Ov next available 

## 2016-04-23 NOTE — Telephone Encounter (Signed)
Spoke w/ Hayley at [facility] informed okay to continue both Plavix and Coumadin.

## 2016-04-23 NOTE — Telephone Encounter (Signed)
She is taking both Coumadin and Plavix, okay to continue with them, closely f/u the Coumadin clinic at the cardiology office

## 2016-04-24 NOTE — Telephone Encounter (Signed)
I contacted the patient's husband. Patient has been scheduled for 05/01/2016.

## 2016-04-24 NOTE — Telephone Encounter (Signed)
I contacted the patient about scheduling her appointment and she asked me to call back after lunch to discuss her appointment. Patient stated her husband was not home and she would need to speak with him before scheduling her appointment.

## 2016-04-28 ENCOUNTER — Other Ambulatory Visit: Payer: Medicare Other

## 2016-05-01 ENCOUNTER — Ambulatory Visit (INDEPENDENT_AMBULATORY_CARE_PROVIDER_SITE_OTHER): Payer: Medicare Other | Admitting: Endocrinology

## 2016-05-01 ENCOUNTER — Encounter: Payer: Self-pay | Admitting: Endocrinology

## 2016-05-01 VITALS — BP 132/78 | HR 59 | Ht 69.0 in | Wt 192.0 lb

## 2016-05-01 DIAGNOSIS — I2510 Atherosclerotic heart disease of native coronary artery without angina pectoris: Secondary | ICD-10-CM | POA: Diagnosis not present

## 2016-05-01 DIAGNOSIS — Z794 Long term (current) use of insulin: Secondary | ICD-10-CM

## 2016-05-01 DIAGNOSIS — E1122 Type 2 diabetes mellitus with diabetic chronic kidney disease: Secondary | ICD-10-CM

## 2016-05-01 DIAGNOSIS — N183 Chronic kidney disease, stage 3 (moderate): Secondary | ICD-10-CM

## 2016-05-01 LAB — POCT GLYCOSYLATED HEMOGLOBIN (HGB A1C): Hemoglobin A1C: 5.7

## 2016-05-01 MED ORDER — INSULIN NPH ISOPHANE & REGULAR (70-30) 100 UNIT/ML ~~LOC~~ SUSP
SUBCUTANEOUS | 11 refills | Status: DC
Start: 2016-05-01 — End: 2016-05-07

## 2016-05-01 NOTE — Progress Notes (Signed)
Subjective:    Patient ID: [name] [name], female    DOB: [birth year], 76 y.o.   MRN: [id number]  HPI Pt returns for f/u of diabetes mellitus: DM type: Insulin-requiring type 2 Dx'ed: 2229 Complications: polyneuropathy, CAD, and retinopathy.  Therapy: insulin since soon after dx.  GDM: never DKA: never Severe hypoglycemia: last episode was in 2016. Pancreatitis: never.   Other: she is on BID premixed insulin, due to noncompliance with multiple daily injections.  She takes human insulin, due to cost.  Interval history: She has had 2 episodes of severe hypoglycemia over the past few months, usually at HS.  She takes 25-30 units qam and 15-20 units qpm.   Past Medical History:  Diagnosis Date  . Atrial fibrillation ([place])    SVT dx 2007, cath 2007 mild CAD, then had an cardioversion, ablation; still on coumadin , has occ palpitation, EKG 03-2010 NSR  . Blindness of left eye   . DIABETES MELLITUS, TYPE II 03/27/2006   dr [name]  . Eye muscle weakness    Right eye weakness after cataract surgery  . GERD (gastroesophageal reflux disease) 10/05/2011  . 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 HYPERLIPIDEMIA 03/27/2006  . HYPERTENSION 03/27/2006  . Intertrochanteric fracture of right hip ([place]) 07/13/2012  . 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 Osteopenia 2004   Dexa 2004 showed Osteopenia, DEXA 03/2007 normal  . Osteopenia   . Other chronic cystitis with hematuria   . Recurrent urinary tract infection    Seeing Urology  . 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 Seizures ([place])   . 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[place]) 7/98/9211    Past Surgical History:  Procedure Laterality Date  . Bilateral anke surgery Bilateral   . CARDIAC CATHETERIZATION N/A 05/17/2015   Procedure: Left Heart Cath and Coronary Angiography;  Surgeon: Jettie Booze, MD;  Location: [facility];  Service: Cardiovascular;  Laterality: N/A;  . CARDIAC CATHETERIZATION N/A 05/17/2015   Procedure: Coronary Balloon  Angioplasty;  Surgeon: Jettie Booze, MD;  Location: [facility];  Service: Cardiovascular;  Laterality: N/A;  . FEMUR IM NAIL Right 07/15/2012   Procedure: INTRAMEDULLARY (IM) NAIL HIP;  Surgeon: Johnny Bridge, MD;  Location: [facility];  Service: Orthopedics;  Laterality: Right;  . FEMUR IM NAIL Left 12/02/2015   Procedure: INTRAMEDULLARY (IM) NAIL FEMORAL;  Surgeon: [name], MD;  Location: [facility];  Service: Orthopedics;  Laterality: Left;  . INCISION AND DRAINAGE HIP Left 01/03/2016   Procedure: IRRIGATION AND DEBRIDEMENT HIP;  Surgeon: [name], MD;  Location: [facility];  Service: Orthopedics;  Laterality: Left;  . left hip surgery    . SHOULDER SURGERY      Social History   Social History  . Marital status: Married    Spouse name: Jenny Reichmann  . Number of children: 1  . Years of education: College   Occupational History  . retired  Retired   Social History Main Topics  . Smoking status: Former Smoker    Types: Cigarettes    Quit date: 03/02/1978  . Smokeless tobacco: Never Used  . Alcohol use No  . Drug use: No  . Sexual activity: No   Other Topics Concern  . Not on file   Social History Narrative   Patient lives at home spouse. Uses a walker consistently.   Moved from [place] 2004   1 son, problems w/ drugs, passed away 03/13/16-- OD        Current  Outpatient Prescriptions on File Prior to Visit  Medication Sig Dispense Refill  . atorvastatin (LIPITOR) 80 MG tablet Take 1 tablet (80 mg total) by mouth daily. 30 tablet 5  . calcium-vitamin D (OSCAL WITH D) 500-200 MG-UNIT per tablet Take 1 tablet by mouth daily.    . clopidogrel (PLAVIX) 75 MG tablet Take 1 tablet (75 mg total) by mouth daily. 30 tablet 5  . furosemide (LASIX) 40 MG tablet Take 1 tablet (40 mg total) by mouth 2 (two) times daily. 60 tablet 5  . metoprolol succinate (TOPROL-XL) 50 MG 24 hr tablet Take 25 mg by mouth daily. Take with or immediately following a meal.    . pantoprazole (PROTONIX) 20 MG  tablet Take 20 mg by mouth daily.    . promethazine (PHENERGAN) 12.5 MG tablet TAKE ONE TABLET BY MOUTH EVERY 6 HOURS AS NEEDED FOR NAUSEA AND VOMITING 30 tablet 0  . protein supplement (PROSOURCE NO CARB) LIQD Take 30 mLs by mouth 2 (two) times daily.    [name] warfarin (COUMADIN) 2.5 MG tablet Take 1 tablet (2.5 mg total) by mouth daily. 75 tablet 1  . levETIRAcetam (KEPPRA XR) 500 MG 24 hr tablet Take 1 tablet (500 mg total) by mouth daily. (Patient not taking: Reported on 05/01/2016) 90 tablet 1   No current facility-administered medications on file prior to visit.     Allergies  Allergen Reactions  . Procaine Hcl Anaphylaxis  . Amoxicillin Itching    Family History  Problem Relation Age of Onset  . Diabetes Father   . Heart attack Father 91  . Diabetes Sister   . Drug abuse Son   . Cancer Neg Hx     no hx of colon or breast cancer    BP 132/78   Pulse (!) 59   Ht 5\' 9"  (1.753 m)   Wt 192 lb (87.1 kg)   SpO2 98%   BMI 28.35 kg/m   Review of Systems She has lost weight sine last ov here.    Objective:   Physical Exam VITAL SIGNS:  See vs page GENERAL: no distress Pulses: dorsalis pedis intact bilat.   MSK: no deformity of the feet CV: trace bilat leg edema.   Skin:  no ulcer on the feet.  normal color and temp on the feet.   Neuro: sensation is intact to touch on the feet. Ext: There is bilateral onychomycosis of the toenails.    A1c=5.8%     Assessment & Plan:  Insulin-requiring type 2 DM, with CAD: overcontrolled Weight loss: due to recent illnesses Severe hypoglycemia, due to increased insulin dosage.  Patient is advised the following: Patient Instructions  Please come back for a follow-up appointment in 2 months.   check your blood sugar twice a day.  vary the time of day when you check, between before the 3 meals, and at bedtime.  also check if you have symptoms of your blood sugar being too high or too low.  please keep a record of the readings and bring it  to your next appointment here.  please call us sooner if your blood sugar goes below 70, or if you have a lot of readings over 200.   Please reduce the insulin to 25 units with breakfast and 5 units with the evening meal.

## 2016-05-01 NOTE — Patient Instructions (Addendum)
Please come back for a follow-up appointment in 2 months.   check your blood sugar twice a day.  vary the time of day when you check, between before the 3 meals, and at bedtime.  also check if you have symptoms of your blood sugar being too high or too low.  please keep a record of the readings and bring it to your next appointment here.  please call us sooner if your blood sugar goes below 70, or if you have a lot of readings over 200.   Please reduce the insulin to 25 units with breakfast and 5 units with the evening meal.

## 2016-05-04 ENCOUNTER — Ambulatory Visit (INDEPENDENT_AMBULATORY_CARE_PROVIDER_SITE_OTHER): Payer: Medicare Other | Admitting: *Deleted

## 2016-05-04 ENCOUNTER — Encounter (HOSPITAL_COMMUNITY): Payer: Self-pay | Admitting: Adult Health

## 2016-05-04 ENCOUNTER — Emergency Department (HOSPITAL_COMMUNITY)
Admission: EM | Admit: 2016-05-04 | Discharge: 2016-05-04 | Disposition: A | Discharge status: Home / Self Care | Payer: Medicare Other | Attending: Emergency Medicine | Admitting: Emergency Medicine

## 2016-05-04 DIAGNOSIS — Z7901 Long term (current) use of anticoagulants: Secondary | ICD-10-CM | POA: Diagnosis not present

## 2016-05-04 DIAGNOSIS — N183 Chronic kidney disease, stage 3 (moderate): Secondary | ICD-10-CM | POA: Insufficient documentation

## 2016-05-04 DIAGNOSIS — I2510 Atherosclerotic heart disease of native coronary artery without angina pectoris: Secondary | ICD-10-CM

## 2016-05-04 DIAGNOSIS — E11319 Type 2 diabetes mellitus with unspecified diabetic retinopathy without macular edema: Secondary | ICD-10-CM | POA: Diagnosis not present

## 2016-05-04 DIAGNOSIS — I4892 Unspecified atrial flutter: Secondary | ICD-10-CM | POA: Diagnosis not present

## 2016-05-04 DIAGNOSIS — I5021 Acute systolic (congestive) heart failure: Secondary | ICD-10-CM | POA: Insufficient documentation

## 2016-05-04 DIAGNOSIS — Z87891 Personal history of nicotine dependence: Secondary | ICD-10-CM | POA: Diagnosis not present

## 2016-05-04 DIAGNOSIS — I4891 Unspecified atrial fibrillation: Secondary | ICD-10-CM

## 2016-05-04 DIAGNOSIS — E162 Hypoglycemia, unspecified: Secondary | ICD-10-CM

## 2016-05-04 DIAGNOSIS — E876 Hypokalemia: Secondary | ICD-10-CM | POA: Diagnosis not present

## 2016-05-04 DIAGNOSIS — Z794 Long term (current) use of insulin: Secondary | ICD-10-CM | POA: Diagnosis not present

## 2016-05-04 DIAGNOSIS — E1122 Type 2 diabetes mellitus with diabetic chronic kidney disease: Secondary | ICD-10-CM | POA: Insufficient documentation

## 2016-05-04 DIAGNOSIS — I252 Old myocardial infarction: Secondary | ICD-10-CM | POA: Insufficient documentation

## 2016-05-04 DIAGNOSIS — R410 Disorientation, unspecified: Secondary | ICD-10-CM | POA: Diagnosis present

## 2016-05-04 DIAGNOSIS — I130 Hypertensive heart and chronic kidney disease with heart failure and stage 1 through stage 4 chronic kidney disease, or unspecified chronic kidney disease: Secondary | ICD-10-CM | POA: Diagnosis not present

## 2016-05-04 DIAGNOSIS — Z5181 Encounter for therapeutic drug level monitoring: Secondary | ICD-10-CM | POA: Diagnosis not present

## 2016-05-04 DIAGNOSIS — E11649 Type 2 diabetes mellitus with hypoglycemia without coma: Secondary | ICD-10-CM | POA: Insufficient documentation

## 2016-05-04 LAB — CBC
HEMATOCRIT: 36.3 % (ref 36.0–46.0)
Hemoglobin: 11.5 g/dL — ABNORMAL LOW (ref 12.0–15.0)
MCH: 28.8 pg (ref 26.0–34.0)
MCHC: 31.7 g/dL (ref 30.0–36.0)
MCV: 91 fL (ref 78.0–100.0)
Platelets: 292 10*3/uL (ref 150–400)
RBC: 3.99 MIL/uL (ref 3.87–5.11)
RDW: 13.8 % (ref 11.5–15.5)
WBC: 6.7 10*3/uL (ref 4.0–10.5)

## 2016-05-04 LAB — BASIC METABOLIC PANEL
Anion gap: 9 (ref 5–15)
BUN: 29 mg/dL — AB (ref 6–20)
CHLORIDE: 101 mmol/L (ref 101–111)
CO2: 28 mmol/L (ref 22–32)
CREATININE: 1.74 mg/dL — AB (ref 0.44–1.00)
Calcium: 9.1 mg/dL (ref 8.9–10.3)
GFR, EST AFRICAN AMERICAN: 32 mL/min — AB (ref >60)
GFR, EST NON AFRICAN AMERICAN: 28 mL/min — AB (ref >60)
Glucose, Bld: 155 mg/dL — ABNORMAL HIGH (ref 65–99)
POTASSIUM: 2.8 mmol/L — AB (ref 3.5–5.1)
SODIUM: 138 mmol/L (ref 135–145)

## 2016-05-04 LAB — URINALYSIS, ROUTINE W REFLEX MICROSCOPIC
Bilirubin Urine: NEGATIVE
Glucose, UA: NEGATIVE mg/dL
Hgb urine dipstick: NEGATIVE
Ketones, ur: NEGATIVE mg/dL
Nitrite: NEGATIVE
Protein, ur: NEGATIVE mg/dL
SPECIFIC GRAVITY, URINE: 1.005 (ref 1.005–1.030)
pH: 7 (ref 5.0–8.0)

## 2016-05-04 LAB — CBG MONITORING, ED
GLUCOSE-CAPILLARY: 227 mg/dL — AB (ref 65–99)
GLUCOSE-CAPILLARY: 106 mg/dL — AB (ref 65–99)
GLUCOSE-CAPILLARY: 119 mg/dL — AB (ref 65–99)
GLUCOSE-CAPILLARY: 174 mg/dL — AB (ref 65–99)
GLUCOSE-CAPILLARY: 196 mg/dL — AB (ref 65–99)
Glucose-Capillary: 29 mg/dL — CL (ref 65–99)

## 2016-05-04 LAB — I-STAT TROPONIN, ED: Troponin i, poc: 0 ng/mL (ref 0.00–0.08)

## 2016-05-04 LAB — POCT INR: INR: 3.4

## 2016-05-04 MED ORDER — POTASSIUM CHLORIDE CRYS ER 20 MEQ PO TBCR
40.0000 meq | EXTENDED_RELEASE_TABLET | Freq: Once | ORAL | Status: AC
  Administered 2016-05-04: 40 meq via ORAL
  Filled 2016-05-04: qty 2

## 2016-05-04 MED ORDER — POTASSIUM CHLORIDE ER 20 MEQ PO TBCR: 20.0000 meq | EXTENDED_RELEASE_TABLET | Freq: Two times a day (BID) | ORAL | 0 refills | Status: DC

## 2016-05-04 MED ORDER — DEXTROSE 50 % IV SOLN
1.0000 | Freq: Once | INTRAVENOUS | Status: AC
  Administered 2016-05-04: 50 mL via INTRAVENOUS

## 2016-05-04 MED ORDER — DEXTROSE 50 % IV SOLN
INTRAVENOUS | Status: AC
  Filled 2016-05-04: qty 50

## 2016-05-04 NOTE — ED Notes (Signed)
Lunch tray set up for pt. Family at bedside helping pt eat. Pt sleepy, but easily arousable.

## 2016-05-04 NOTE — ED Provider Notes (Addendum)
[facility] DEPT Provider Note   CSN: [id number] Arrival date & time: 05/04/16  1302     History   Chief Complaint Chief Complaint  Patient presents with  . Altered Mental Status    HPI [name] [name] is a 76 y.o. female.  HPI Patient presents to the emergency room with altered mental status and diaphoresis. Patient has history of diabetes and recurrent episodes of hypoglycemia. She was just recently seen by her endocrinologist, Dr. [name]. He recommended decreasing her insulin dose. She is supposed to be taking  insulin to 25 units with breakfast and 5 units with the evening meal.  This morning the patient woke up and she felt fine. She had breakfast and took her insulin. Patient was scheduled to have her INR checked this morning. She went to the doctor's office to have that done. While she was there she started feeling poorly. Husband went to get her something to eat for lunch. Patient became diaphoretic and became confused and unresponsive. Her husband drove her to the hospital.  In the ED the patient was noted to have a CBG of 29. Past Medical History:  Diagnosis Date  . Atrial fibrillation ([place])    SVT dx 2007, cath 2007 mild CAD, then had an cardioversion, ablation; still on coumadin , has occ palpitation, EKG 03-2010 NSR  . Blindness of left eye   . DIABETES MELLITUS, TYPE II 03/27/2006   dr [name]  . Eye muscle weakness    Right eye weakness after cataract surgery  . GERD (gastroesophageal reflux disease) 10/05/2011  . 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 HYPERLIPIDEMIA 03/27/2006  . HYPERTENSION 03/27/2006  . Intertrochanteric fracture of right hip ([place]) 07/13/2012  . 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 Osteopenia 2004   Dexa 2004 showed Osteopenia, DEXA 03/2007 normal  . Osteopenia   . Other chronic cystitis with hematuria   . Recurrent urinary tract infection    Seeing Urology  . 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 Seizures ([place])   . 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[place])  9/93/5701    Patient Active Problem List   Diagnosis Date Noted  . Pruritus 02/18/2016  . Decreased strength of lower extremity   . Gram-negative infection   . CKD (chronic kidney disease) stage 3, GFR 30-59 ml/min   . Postoperative anemia due to acute blood loss 01/04/2016  . Left hip postoperative wound infection 01/02/2016  . Closed intertrochanteric fracture of hip, left, sequela 12/25/2015  . Anemia 12/01/2015  . Ankle fracture, left 09/17/2015  . Ankle fracture, bimalleolar, closed   . Bilateral fibular fractures 09/14/2015  . Hypertensive heart disease 05/20/2015  . CAD (coronary artery disease) 05/20/2015  . Acute systolic CHF (congestive heart failure) ([place]) 05/20/2015  . Anticoagulated on Coumadin 05/20/2015  . Type 2 diabetes mellitus with vascular disease ([place]) 05/17/2015  . NSTEMI (non-ST elevated myocardial infarction) ([place])   . Bacteremia due to Escherichia coli 05/11/2015  . Acute kidney injury superimposed on chronic kidney disease ([place]) 05/11/2015  . Uncontrolled hypertension 05/11/2015  . 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[place]) 77/93/9030  . PCP NOTES >>>>>>>>>>>>>>>> 12/28/2014  . Depression 05/17/2014  . Memory loss 05/17/2014  . Anxiety and depression, PCP notes  11/24/2013  . Severe obesity (BMI >= 40) ([place]) 07/14/2013  . Encounter for therapeutic drug monitoring 06/02/2013  . Dilantin toxicity 11/21/2012  . History of encephalitis 11/18/2012  . Localization-related (focal) (partial) epilepsy and epileptic syndromes with complex partial seizures, without mention of intractable epilepsy 09/27/2012  . Intertrochanteric fracture  of right hip ([place]) 07/13/2012  . DM (diabetes mellitus) ([place]) 01/19/2012  . GERD (gastroesophageal reflux disease) 10/05/2011  . Shoulder pain 08/27/2011  . Seizure disorder ([place]) 05/24/2011  . Annual physical exam 02/10/2011  . Atrial fibrillation ([place])   . 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 Hyperlipidemia 03/27/2006  . 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 Essential hypertension 03/27/2006  . OSTEOPENIA 03/27/2006    Past Surgical History:  Procedure Laterality Date  . Bilateral anke surgery Bilateral   . CARDIAC CATHETERIZATION N/A 05/17/2015   Procedure: Left Heart Cath and Coronary Angiography;  Surgeon: Jettie Booze, MD;  Location: [facility];  Service: Cardiovascular;  Laterality: N/A;  . CARDIAC CATHETERIZATION N/A 05/17/2015   Procedure: Coronary Balloon Angioplasty;  Surgeon: Jettie Booze, MD;  Location: [facility];  Service: Cardiovascular;  Laterality: N/A;  . FEMUR IM NAIL Right 07/15/2012   Procedure: INTRAMEDULLARY (IM) NAIL HIP;  Surgeon: Johnny Bridge, MD;  Location: [facility];  Service: Orthopedics;  Laterality: Right;  . FEMUR IM NAIL Left 12/02/2015   Procedure: INTRAMEDULLARY (IM) NAIL FEMORAL;  Surgeon: [name], MD;  Location: [facility];  Service: Orthopedics;  Laterality: Left;  . INCISION AND DRAINAGE HIP Left 01/03/2016   Procedure: IRRIGATION AND DEBRIDEMENT HIP;  Surgeon: [name], MD;  Location: [facility];  Service: Orthopedics;  Laterality: Left;  . left hip surgery    . SHOULDER SURGERY      OB History    No data available       Home Medications    Prior to Admission medications   Medication Sig Start Date End Date Taking? Authorizing Provider  atorvastatin (LIPITOR) 80 MG tablet Take 1 tablet (80 mg total) by mouth daily. 04/10/16  Yes Colon Branch, MD  calcium-vitamin D (OSCAL WITH D) 500-200 MG-UNIT per tablet Take 1 tablet by mouth daily.   Yes Historical Provider, MD  clopidogrel (PLAVIX) 75 MG tablet Take 1 tablet (75 mg total) by mouth daily. 04/10/16  Yes Colon Branch, MD  furosemide (LASIX) 40 MG tablet Take 1 tablet (40 mg total) by mouth 2 (two) times daily. 04/10/16  Yes Colon Branch, MD  insulin NPH-regular Human (NOVOLIN 70/30) (70-30) 100 UNIT/ML injection 25 units with breakfast and 5 units with the evening  meal Patient taking differently: Inject 10-25 Units into the skin See admin instructions. 25 units with breakfast and 10 units with the evening meal 05/01/16  Yes Renato Shin, MD  metoprolol succinate (TOPROL-XL) 50 MG 24 hr tablet Take 25 mg by mouth daily. Take with or immediately following a meal.   Yes Historical Provider, MD  Multiple Vitamins-Minerals (MULTIVITAMIN ADULT PO) Take by mouth.   Yes Historical Provider, MD  pantoprazole (PROTONIX) 20 MG tablet Take 20 mg by mouth daily.   Yes Historical Provider, MD  promethazine (PHENERGAN) 12.5 MG tablet TAKE ONE TABLET BY MOUTH EVERY 6 HOURS AS NEEDED FOR NAUSEA AND VOMITING 03/30/16  Yes Colon Branch, MD  protein supplement (PROSOURCE NO CARB) LIQD Take 30 mLs by mouth 2 (two) times daily.   Yes Historical Provider, MD  warfarin (COUMADIN) 2.5 MG tablet Take 1 tablet (2.5 mg total) by mouth daily. Patient taking differently: Take 5-7.5 mg by mouth See admin instructions. Pt takes 5mg  Tuesday, Thursday, Saturday - pt takes 7.5mg  Monday, Wednesday, Friday, Sunday 02/10/16  Yes Evans Lance, MD  levETIRAcetam (KEPPRA XR) 500  MG 24 hr tablet Take 1 tablet (500 mg total) by mouth daily. Patient not taking: Reported on 05/01/2016 12/24/15   Garvin Fila, MD  potassium chloride 20 MEQ TBCR Take 20 mEq by mouth 2 (two) times daily. 05/04/16 05/09/16  Dorie Rank, MD    Family History Family History  Problem Relation Age of Onset  . Diabetes Father   . Heart attack Father 86  . Diabetes Sister   . Drug abuse Son   . Cancer Neg Hx     no hx of colon or breast cancer    Social History Social History  Substance Use Topics  . Smoking status: Former Smoker    Types: Cigarettes    Quit date: 03/02/1978  . Smokeless tobacco: Never Used  . Alcohol use No     Allergies   Procaine hcl and Amoxicillin   Review of Systems Review of Systems  All other systems reviewed and are negative.    Physical Exam Updated Vital Signs BP 161/60   Pulse  (!) 54   Temp (!) 95.9 F (35.5 C) (Axillary) Comment: unable to get orasl temp-pt is too sleepy.   Resp 13   SpO2 99%   Physical Exam  Constitutional: She appears listless. No distress.  HENT:  Head: Normocephalic and atraumatic.  Right Ear: External ear normal.  Left Ear: External ear normal.  Eyes: Conjunctivae are normal. Right eye exhibits no discharge. Left eye exhibits no discharge. No scleral icterus.  Neck: Neck supple. No tracheal deviation present.  Cardiovascular: Normal rate, regular rhythm and intact distal pulses.   Pulmonary/Chest: Effort normal and breath sounds normal. No stridor. No respiratory distress. She has no wheezes. She has no rales.  Abdominal: Soft. Bowel sounds are normal. She exhibits no distension. There is no tenderness. There is no rebound and no guarding.  Musculoskeletal: She exhibits no edema or tenderness.  Neurological: She has normal strength. She appears listless. No cranial nerve deficit (no facial droop, extraocular movements intact, no slurred speech) or sensory deficit. She exhibits normal muscle tone. She displays no seizure activity. Coordination normal.  Skin: Skin is warm and dry. No rash noted. There is pallor.  Psychiatric: She has a normal mood and affect.  Nursing note reviewed.    ED Treatments / Results  Labs (all labs ordered are listed, but only abnormal results are displayed) Labs Reviewed  BASIC METABOLIC PANEL - Abnormal; Notable for the following:       Result Value   Potassium 2.8 (*)    Glucose, Bld 155 (*)    BUN 29 (*)    Creatinine, Ser 1.74 (*)    GFR calc non Af Amer 28 (*)    GFR calc Af Amer 32 (*)    All other components within normal limits  CBC - Abnormal; Notable for the following:    Hemoglobin 11.5 (*)    All other components within normal limits  URINALYSIS, ROUTINE W REFLEX MICROSCOPIC - Abnormal; Notable for the following:    Leukocytes, UA SMALL (*)    Bacteria, UA RARE (*)    Squamous  Epithelial / LPF 0-5 (*)    All other components within normal limits  CBG MONITORING, ED - Abnormal; Notable for the following:    Glucose-Capillary 29 (*)    All other components within normal limits  CBG MONITORING, ED - Abnormal; Notable for the following:    Glucose-Capillary 227 (*)    All other components within normal limits  CBG  MONITORING, ED - Abnormal; Notable for the following:    Glucose-Capillary 106 (*)    All other components within normal limits  CBG MONITORING, ED - Abnormal; Notable for the following:    Glucose-Capillary 119 (*)    All other components within normal limits  CBG MONITORING, ED - Abnormal; Notable for the following:    Glucose-Capillary 174 (*)    All other components within normal limits  CBG MONITORING, ED - Abnormal; Notable for the following:    Glucose-Capillary 196 (*)    All other components within normal limits  I-STAT TROPOININ, ED    EKG  EKG Interpretation  Date/Time:  Monday May 04 2016 15:01:20 EST Ventricular Rate:  55 PR Interval:    QRS Duration: 138 QT Interval:  565 QTC Calculation: 541 R Axis:   -30 Text Interpretation:  Sinus rhythm Short PR interval Probable left atrial enlargement Nonspecific intraventricular conduction delay Borderline repolarization abnormality Baseline wander in lead(s) V2 No significant change since last tracing Confirmed by Kimarie Coor  MD-J, Drayce Tawil ([id number]) on 05/04/2016 4:17:56 PM        Procedures Procedures (including critical care time)  Medications Ordered in ED Medications  dextrose 50 % solution 50 mL (50 mLs Intravenous Given 05/04/16 1305)  potassium chloride SA (K-DUR,KLOR-CON) CR tablet 40 mEq (40 mEq Oral Given 05/04/16 1633)     Initial Impression / Assessment and Plan / ED Course  I have reviewed the triage vital signs and the nursing notes.  Pertinent labs & imaging results that were available during my care of the patient were reviewed by me and considered in my medical decision  making (see chart for details).  Clinical Course as of May 05 1631  Mon May 04, 2016  1616 Pt is feeling much better at this time.  She is alert and awake.  She has no complaints.  She would like to go home.  [JK]    Clinical Course User Index [JK] Dorie Rank, MD    Patient presented to the emergency room with an episode of hypoglycemia. I reviewed the notes from her recent visit with Dr. Ebony Hail. Patient's had some issues with hypoglycemia medical noncompliance in the past. Patient was post to decrease her insulin to 5 units in the evening. Patient states she actually stopped talk a little less than 10 units last evening.  I explained to her that it is very important that she takes the medications as directed by Dr. [name]. She needs to continue to monitor her blood sugars because she may need further adjustment.  Patient's laboratory tests also showed hypokalemia. I will give her a dose of oral potassium. I'll have her follow-up with her primary doctor.  Final Clinical Impressions(s) / ED Diagnoses   Final diagnoses:  Hypoglycemia  Hypokalemia    New Prescriptions New Prescriptions   POTASSIUM CHLORIDE 20 MEQ TBCR    Take 20 mEq by mouth 2 (two) times daily.     Dorie Rank, MD 05/04/16 [phone]  Discussed with patient.   Will have her take the insulin at 5 units in the evening, and 20 units in the am since she took 25 units this morning and still had hypolgycemia   Dorie Rank, MD 05/04/16 1640

## 2016-05-04 NOTE — Discharge Instructions (Signed)
Make sure to monitor your blood sugar as instructed by Dr. [name]. Decrease your  insulin to the doses he recommended the other day.  Follow up with your primary care doctor to have your potassium rechecked next week

## 2016-05-04 NOTE — ED Triage Notes (Addendum)
Pulled from vehicle with altered mental status and diaphoresis, CBG 29, 1 amp of D50 given IV, second CBG 227. Pt is able to open eyes and talk, oriented to person and place.  Had insulin change yesterday

## 2016-05-07 ENCOUNTER — Telehealth: Payer: Self-pay | Admitting: Endocrinology

## 2016-05-07 ENCOUNTER — Ambulatory Visit (INDEPENDENT_AMBULATORY_CARE_PROVIDER_SITE_OTHER): Payer: Medicare Other | Admitting: Endocrinology

## 2016-05-07 ENCOUNTER — Encounter: Payer: Self-pay | Admitting: Endocrinology

## 2016-05-07 VITALS — BP 134/84 | HR 67 | Ht 69.0 in | Wt 187.0 lb

## 2016-05-07 DIAGNOSIS — I2510 Atherosclerotic heart disease of native coronary artery without angina pectoris: Secondary | ICD-10-CM | POA: Diagnosis not present

## 2016-05-07 DIAGNOSIS — E1159 Type 2 diabetes mellitus with other circulatory complications: Secondary | ICD-10-CM | POA: Diagnosis not present

## 2016-05-07 MED ORDER — INSULIN NPH ISOPHANE & REGULAR (70-30) 100 UNIT/ML ~~LOC~~ SUSP: 15.0000 [IU] | Freq: Every day | SUBCUTANEOUS | 11 refills | Status: DC

## 2016-05-07 NOTE — Telephone Encounter (Signed)
I contacted the patient's husband. He wanted to verify the patient's insulin directions. I advised per Dr. Cordelia Pen instructions for the patient to take Novolin 70/30 15 units every am. He voiced understanding and had no further questions.

## 2016-05-07 NOTE — Patient Instructions (Addendum)
Please come back for a follow-up appointment in 2 weeks.   check your blood sugar twice a day.  vary the time of day when you check, between before the 3 meals, and at bedtime.  also check if you have symptoms of your blood sugar being too high or too low.  please keep a record of the readings and bring it to your next appointment here.  please call us sooner if your blood sugar goes below 70, or if you have a lot of readings over 200.  Please reduce the insulin to 15 units with breakfast and none in the evening meal.

## 2016-05-07 NOTE — Telephone Encounter (Signed)
Pt's spouse called in to speak with Megan, did not specify why, just requested a call back.

## 2016-05-07 NOTE — Progress Notes (Signed)
Subjective:    Patient ID: [name] [name], female    DOB: 07/17/1940, 76 y.o.   MRN: [id number]  HPI Pt returns for f/u of diabetes mellitus: DM type: Insulin-requiring type 2 Dx'ed: 4098 Complications: polyneuropathy, CAD, and retinopathy.  Therapy: insulin since soon after dx.  GDM: never DKA: never Severe hypoglycemia: last episode was in 2016. Pancreatitis: never.   Other: she is on BID premixed insulin, due to noncompliance with multiple daily injections.  She takes human insulin, due to cost.  Interval history: She was recently seen again in ER, for severe hypoglycemia.  She was taking more than the prescribed insulin dosage.   Past Medical History:  Diagnosis Date  . Atrial fibrillation ([place])    SVT dx 2007, cath 2007 mild CAD, then had an cardioversion, ablation; still on coumadin , has occ palpitation, EKG 03-2010 NSR  . Blindness of left eye   . DIABETES MELLITUS, TYPE II 03/27/2006   dr [name]  . Eye muscle weakness    Right eye weakness after cataract surgery  . GERD (gastroesophageal reflux disease) 10/05/2011  . 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 HYPERLIPIDEMIA 03/27/2006  . HYPERTENSION 03/27/2006  . Intertrochanteric fracture of right hip ([place]) 07/13/2012  . 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 Osteopenia 2004   Dexa 2004 showed Osteopenia, DEXA 03/2007 normal  . Osteopenia   . Other chronic cystitis with hematuria   . Recurrent urinary tract infection    Seeing Urology  . 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 Seizures ([place])   . 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[place]) 03/20/1476    Past Surgical History:  Procedure Laterality Date  . Bilateral anke surgery Bilateral   . CARDIAC CATHETERIZATION N/A 05/17/2015   Procedure: Left Heart Cath and Coronary Angiography;  Surgeon: Jettie Booze, MD;  Location: [facility];  Service: Cardiovascular;  Laterality: N/A;  . CARDIAC CATHETERIZATION N/A 05/17/2015   Procedure: Coronary Balloon Angioplasty;  Surgeon:  Jettie Booze, MD;  Location: [facility];  Service: Cardiovascular;  Laterality: N/A;  . FEMUR IM NAIL Right 07/15/2012   Procedure: INTRAMEDULLARY (IM) NAIL HIP;  Surgeon: Johnny Bridge, MD;  Location: [facility];  Service: Orthopedics;  Laterality: Right;  . FEMUR IM NAIL Left 12/02/2015   Procedure: INTRAMEDULLARY (IM) NAIL FEMORAL;  Surgeon: [name], MD;  Location: [facility];  Service: Orthopedics;  Laterality: Left;  . INCISION AND DRAINAGE HIP Left 01/03/2016   Procedure: IRRIGATION AND DEBRIDEMENT HIP;  Surgeon: [name], MD;  Location: [facility];  Service: Orthopedics;  Laterality: Left;  . left hip surgery    . SHOULDER SURGERY      Social History   Social History  . Marital status: Married    Spouse name: Jenny Reichmann  . Number of children: 1  . Years of education: College   Occupational History  . retired  Retired   Social History Main Topics  . Smoking status: Former Smoker    Types: Cigarettes    Quit date: 03/02/1978  . Smokeless tobacco: Never Used  . Alcohol use No  . Drug use: No  . Sexual activity: No   Other Topics Concern  . Not on file   Social History Narrative   Patient lives at home spouse. Uses a walker consistently.   Moved from [place] 2004   1 son, problems w/ drugs, passed away 02-29-2016-- OD        Current Outpatient Prescriptions on File Prior to  Visit  Medication Sig Dispense Refill  . atorvastatin (LIPITOR) 80 MG tablet Take 1 tablet (80 mg total) by mouth daily. 30 tablet 5  . calcium-vitamin D (OSCAL WITH D) 500-200 MG-UNIT per tablet Take 1 tablet by mouth daily.    . clopidogrel (PLAVIX) 75 MG tablet Take 1 tablet (75 mg total) by mouth daily. 30 tablet 5  . furosemide (LASIX) 40 MG tablet Take 1 tablet (40 mg total) by mouth 2 (two) times daily. 60 tablet 5  . metoprolol succinate (TOPROL-XL) 50 MG 24 hr tablet Take 25 mg by mouth daily. Take with or immediately following a meal.    . Multiple Vitamins-Minerals (MULTIVITAMIN ADULT PO) Take  by mouth.    . pantoprazole (PROTONIX) 20 MG tablet Take 20 mg by mouth daily.    . potassium chloride 20 MEQ TBCR Take 20 mEq by mouth 2 (two) times daily. 10 tablet 0  . promethazine (PHENERGAN) 12.5 MG tablet TAKE ONE TABLET BY MOUTH EVERY 6 HOURS AS NEEDED FOR NAUSEA AND VOMITING 30 tablet 0  . protein supplement (PROSOURCE NO CARB) LIQD Take 30 mLs by mouth 2 (two) times daily.    [name] warfarin (COUMADIN) 2.5 MG tablet Take 1 tablet (2.5 mg total) by mouth daily. (Patient taking differently: Take 5-7.5 mg by mouth See admin instructions. Pt takes 5mg  Tuesday, Thursday, Saturday - pt takes 7.5mg  Monday, Wednesday, Friday, Sunday) 75 tablet 1  . levETIRAcetam (KEPPRA XR) 500 MG 24 hr tablet Take 1 tablet (500 mg total) by mouth daily. (Patient not taking: Reported on 05/07/2016) 90 tablet 1   No current facility-administered medications on file prior to visit.     Allergies  Allergen Reactions  . Procaine Hcl Anaphylaxis  . Amoxicillin Itching    Family History  Problem Relation Age of Onset  . Diabetes Father   . Heart attack Father 40  . Diabetes Sister   . Drug abuse Son   . Cancer Neg Hx     no hx of colon or breast cancer    BP 134/84   Pulse 67   Ht 5\' 9"  (1.753 m)   Wt 187 lb (84.8 kg)   SpO2 97%   BMI 27.62 kg/m    Review of Systems Denies n/v.  She has lost 20 lbs x 6 months.    Objective:   Physical Exam VITAL SIGNS:  See vs page GENERAL: no distress Pulses: dorsalis pedis intact bilat.   MSK: no deformity of the feet CV: trace bilat leg edema.   Skin:  no ulcer on the feet.  normal color and temp on the feet.   Neuro: sensation is intact to touch on the feet. Ext: There is bilateral onychomycosis of the toenails.      Assessment & Plan:  Insulin-requiring type 2 DM, with retinopathy: overcontrolled.  Hypoglycemia, worse again.   Noncompliance with insulin dosing: we discussed risks. Weight loss.  This is the likely reason for there educed insulin  requirement.  We'll follow this.    Patient is advised the following: Patient Instructions  Please come back for a follow-up appointment in 2 weeks.   check your blood sugar twice a day.  vary the time of day when you check, between before the 3 meals, and at bedtime.  also check if you have symptoms of your blood sugar being too high or too low.  please keep a record of the readings and bring it to your next appointment here.  please call us  sooner if your blood sugar goes below 70, or if you have a lot of readings over 200.  Please reduce the insulin to 15 units with breakfast and none in the evening meal.

## 2016-05-18 ENCOUNTER — Ambulatory Visit (INDEPENDENT_AMBULATORY_CARE_PROVIDER_SITE_OTHER): Payer: Medicare Other | Admitting: Pharmacist

## 2016-05-18 DIAGNOSIS — Z5181 Encounter for therapeutic drug level monitoring: Secondary | ICD-10-CM

## 2016-05-18 DIAGNOSIS — Z7901 Long term (current) use of anticoagulants: Secondary | ICD-10-CM

## 2016-05-18 DIAGNOSIS — I4892 Unspecified atrial flutter: Secondary | ICD-10-CM | POA: Diagnosis not present

## 2016-05-18 DIAGNOSIS — I2510 Atherosclerotic heart disease of native coronary artery without angina pectoris: Secondary | ICD-10-CM

## 2016-05-18 DIAGNOSIS — I4891 Unspecified atrial fibrillation: Secondary | ICD-10-CM

## 2016-05-18 LAB — POCT INR: INR: 2.6

## 2016-05-20 ENCOUNTER — Other Ambulatory Visit: Payer: Self-pay | Admitting: Internal Medicine

## 2016-05-20 ENCOUNTER — Other Ambulatory Visit: Payer: Self-pay | Admitting: Cardiovascular Disease

## 2016-05-20 NOTE — Telephone Encounter (Signed)
Will you please advise for refill, thanks!

## 2016-05-25 ENCOUNTER — Ambulatory Visit: Payer: Medicare Other | Admitting: Neurology

## 2016-05-25 ENCOUNTER — Ambulatory Visit (INDEPENDENT_AMBULATORY_CARE_PROVIDER_SITE_OTHER): Payer: Medicare Other | Admitting: Endocrinology

## 2016-05-25 VITALS — BP 122/64 | HR 66 | Ht 69.0 in | Wt 191.0 lb

## 2016-05-25 DIAGNOSIS — E1159 Type 2 diabetes mellitus with other circulatory complications: Secondary | ICD-10-CM | POA: Diagnosis not present

## 2016-05-25 DIAGNOSIS — I2510 Atherosclerotic heart disease of native coronary artery without angina pectoris: Secondary | ICD-10-CM | POA: Diagnosis not present

## 2016-05-25 MED ORDER — TRIAMCINOLONE ACETONIDE 0.5 % EX OINT: 1.0000 "application " | TOPICAL_OINTMENT | Freq: Four times a day (QID) | CUTANEOUS | 0 refills | Status: DC

## 2016-05-25 NOTE — Progress Notes (Signed)
Subjective:    Patient ID: [name] [name], female    DOB: [birth year], 76 y.o.   MRN: [id number]  HPI Pt returns for f/u of diabetes mellitus: DM type: Insulin-requiring type 2 Dx'ed: 2505 Complications: polyneuropathy, CAD, and retinopathy.  Therapy: insulin since soon after dx.  GDM: never.  DKA: never.  Severe hypoglycemia: last episode was in 2016.  Pancreatitis: never.   Other: she is on BID premixed insulin, due to noncompliance with multiple daily injections.  She takes human insulin, due to cost.  Interval history: no cbg record, but states cbg's vary from 110-300.  It is lowest at HS, and highest fasting (despite not eating at HS).  pt states she feels better in general.  She takes 22 units each morning.   She has severe itching of the right wrist, and assoc rash Past Medical History:  Diagnosis Date  . Atrial fibrillation ([place])    SVT dx 2007, cath 2007 mild CAD, then had an cardioversion, ablation; still on coumadin , has occ palpitation, EKG 03-2010 NSR  . Blindness of left eye   . DIABETES MELLITUS, TYPE II 03/27/2006   dr [name]  . Eye muscle weakness    Right eye weakness after cataract surgery  . GERD (gastroesophageal reflux disease) 10/05/2011  . 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 HYPERLIPIDEMIA 03/27/2006  . HYPERTENSION 03/27/2006  . Intertrochanteric fracture of right hip ([place]) 07/13/2012  . 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 Osteopenia 2004   Dexa 2004 showed Osteopenia, DEXA 03/2007 normal  . Osteopenia   . Other chronic cystitis with hematuria   . Recurrent urinary tract infection    Seeing Urology  . 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 Seizures ([place])   . 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[place]) 3/97/6734    Past Surgical History:  Procedure Laterality Date  . Bilateral anke surgery Bilateral   . CARDIAC CATHETERIZATION N/A 05/17/2015   Procedure: Left Heart Cath and Coronary Angiography;  Surgeon: Jettie Booze, MD;  Location: [facility];   Service: Cardiovascular;  Laterality: N/A;  . CARDIAC CATHETERIZATION N/A 05/17/2015   Procedure: Coronary Balloon Angioplasty;  Surgeon: Jettie Booze, MD;  Location: [facility];  Service: Cardiovascular;  Laterality: N/A;  . FEMUR IM NAIL Right 07/15/2012   Procedure: INTRAMEDULLARY (IM) NAIL HIP;  Surgeon: Johnny Bridge, MD;  Location: [facility];  Service: Orthopedics;  Laterality: Right;  . FEMUR IM NAIL Left 12/02/2015   Procedure: INTRAMEDULLARY (IM) NAIL FEMORAL;  Surgeon: [name], MD;  Location: [facility];  Service: Orthopedics;  Laterality: Left;  . INCISION AND DRAINAGE HIP Left 01/03/2016   Procedure: IRRIGATION AND DEBRIDEMENT HIP;  Surgeon: [name], MD;  Location: [facility];  Service: Orthopedics;  Laterality: Left;  . left hip surgery    . SHOULDER SURGERY      Social History   Social History  . Marital status: Married    Spouse name: Jenny Reichmann  . Number of children: 1  . Years of education: College   Occupational History  . retired  Retired   Social History Main Topics  . Smoking status: Former Smoker    Types: Cigarettes    Quit date: 03/02/1978  . Smokeless tobacco: Never Used  . Alcohol use No  . Drug use: No  . Sexual activity: No   Other Topics Concern  . Not on file   Social History Narrative   Patient lives at home spouse. Uses a walker  consistently.   Moved from [place] 2004   1 son, problems w/ drugs, passed away March 02, 2016-- OD        Current Outpatient Prescriptions on File Prior to Visit  Medication Sig Dispense Refill  . atorvastatin (LIPITOR) 80 MG tablet Take 1 tablet (80 mg total) by mouth daily. 30 tablet 5  . calcium-vitamin D (OSCAL WITH D) 500-200 MG-UNIT per tablet Take 1 tablet by mouth daily.    . clopidogrel (PLAVIX) 75 MG tablet Take 1 tablet (75 mg total) by mouth daily. 30 tablet 5  . furosemide (LASIX) 40 MG tablet Take 1 tablet (40 mg total) by mouth 2 (two) times daily. 60 tablet 5  . insulin NPH-regular Human (NOVOLIN 70/30)  (70-30) 100 UNIT/ML injection Inject 15 Units into the skin daily with breakfast. 10 mL 11  . levETIRAcetam (KEPPRA XR) 500 MG 24 hr tablet Take 1 tablet (500 mg total) by mouth daily. (Patient not taking: Reported on 05/07/2016) 90 tablet 1  . metoprolol succinate (TOPROL-XL) 50 MG 24 hr tablet Take 25 mg by mouth daily. Take with or immediately following a meal.    . Multiple Vitamins-Minerals (MULTIVITAMIN ADULT PO) Take by mouth.    . pantoprazole (PROTONIX) 40 MG tablet Take 1 tablet (40 mg total) by mouth daily. 90 tablet 3  . potassium chloride 20 MEQ TBCR Take 20 mEq by mouth 2 (two) times daily. 10 tablet 0  . promethazine (PHENERGAN) 12.5 MG tablet TAKE ONE TABLET BY MOUTH EVERY 6 HOURS AS NEEDED FOR NAUSEA AND VOMITING 30 tablet 0  . protein supplement (PROSOURCE NO CARB) LIQD Take 30 mLs by mouth 2 (two) times daily.    [name] warfarin (COUMADIN) 2.5 MG tablet TAKE ONE TABLET BY MOUTH ONCE DAILY 75 tablet 2   No current facility-administered medications on file prior to visit.     Allergies  Allergen Reactions  . Procaine Hcl Anaphylaxis  . Amoxicillin Itching    Family History  Problem Relation Age of Onset  . Diabetes Father   . Heart attack Father 73  . Diabetes Sister   . Drug abuse Son   . Cancer Neg Hx     no hx of colon or breast cancer    BP 122/64   Pulse 66   Ht 5\' 9"  (1.753 m)   Wt 191 lb (86.6 kg)   SpO2 98%   BMI 28.21 kg/m   Review of Systems She has regained a few lbs.     Objective:   Physical Exam VITAL SIGNS:  See vs page GENERAL: no distress Pulses: dorsalis pedis intact bilat.   MSK: no deformity of the feet CV: trace bilat leg edema.   Skin:  no ulcer on the feet.  normal color and temp on the feet. Right wrist: moderate eczematous rash. Neuro: sensation is intact to touch on the feet. Ext: There is bilateral onychomycosis of the toenails.      Assessment & Plan:  Insulin-requiring type 2 DM, with CAD: uncertain glycemic  control Dermatitis, new, uncertain etiology.  Patient is advised the following: Patient Instructions  blood tests are requested for you today.  We'll let you know about the results. Please come back for a follow-up appointment in 2  I have sent a prescription to your pharmacy, to stop the itching.   check your blood sugar twice a day.  vary the time of day when you check, between before the 3 meals, and at bedtime.  also check if you have  symptoms of your blood sugar being too high or too low.  please keep a record of the readings and bring it to your next appointment here.  please call us sooner if your blood sugar goes below 70, or if you have a lot of readings over 200.

## 2016-05-25 NOTE — Patient Instructions (Addendum)
blood tests are requested for you today.  We'll let you know about the results. Please come back for a follow-up appointment in 2  I have sent a prescription to your pharmacy, to stop the itching.   check your blood sugar twice a day.  vary the time of day when you check, between before the 3 meals, and at bedtime.  also check if you have symptoms of your blood sugar being too high or too low.  please keep a record of the readings and bring it to your next appointment here.  please call us sooner if your blood sugar goes below 70, or if you have a lot of readings over 200.

## 2016-05-26 ENCOUNTER — Ambulatory Visit (INDEPENDENT_AMBULATORY_CARE_PROVIDER_SITE_OTHER): Payer: Medicare Other | Admitting: Orthopaedic Surgery

## 2016-05-26 ENCOUNTER — Encounter (INDEPENDENT_AMBULATORY_CARE_PROVIDER_SITE_OTHER): Payer: Self-pay | Admitting: Orthopaedic Surgery

## 2016-05-26 VITALS — BP 127/61 | HR 72 | Ht 69.0 in | Wt 178.0 lb

## 2016-05-26 DIAGNOSIS — I2510 Atherosclerotic heart disease of native coronary artery without angina pectoris: Secondary | ICD-10-CM

## 2016-05-26 DIAGNOSIS — S72141G Displaced intertrochanteric fracture of right femur, subsequent encounter for closed fracture with delayed healing: Secondary | ICD-10-CM | POA: Diagnosis not present

## 2016-05-26 NOTE — Progress Notes (Signed)
Office Visit Note   Patient: [name] [name]           Date of Birth: 1941-02-01           MRN: [id number] Visit Date: 05/26/2016              Requested by: Colon Branch, MD [address] [place], [place] [address] PCP: [name], MD   Assessment & Plan: Visit Diagnoses: Healed left intertrochanteric fracture with postoperative drainage which is resolved and responded well with antibiotics.  Plan: Patient has no pain she ambulates with a rolling walker to prevent falling. She's had extensive following histories injuries to her ankles. She is taking calcium and vitamin D. We'll encourage her to continue to work on increase walking, follow her diabetes carefully. She can return as needed.  Follow-Up Instructions: No Follow-up on file.   Orders:  No orders of the defined types were placed in this encounter.  No orders of the defined types were placed in this encounter.     Procedures: No procedures performed   Clinical Data: No additional findings.   Subjective: Chief Complaint  Patient presents with  . Left Hip - Follow-up    Patient returns for three month follow up. She is status post left hip I&D on 01/03/2016. She states that her hip is doing well. She denies any pain. She ambulates with a rolling walker.     Review of Systems 14 point review of systems updated and is unchanged from hospitalization and surgery 01/03/2016. She is off antibiotics.   Objective: Vital Signs: There were no vitals taken for this visit.  Physical Exam  Constitutional: She is oriented to person, place, and time. She appears well-developed.  HENT:  Head: Normocephalic.  Right Ear: External ear normal.  Left Ear: External ear normal.  Eyes: Pupils are equal, round, and reactive to light.  Neck: No tracheal deviation present. No thyromegaly present.  Cardiovascular:  IR and R  Pulmonary/Chest: Effort normal.  Abdominal: Soft.  Musculoskeletal:  Lateral hip incision  is well healed no pain with internal/external rotation knee extension is good. She is a mature with the rolling walker. No cellulitis no tenderness about the hip. Trochanteric bursa is nontender no static notch tenderness.  Neurological: She is alert and oriented to person, place, and time.  Skin: Skin is warm and dry.  Psychiatric: She has a normal mood and affect. Her behavior is normal.    Ortho Exam  Specialty Comments:  No specialty comments available.  Imaging: No results found.   PMFS History: Patient Active Problem List   Diagnosis Date Noted  . Pruritus 02/18/2016  . Decreased strength of lower extremity   . Gram-negative infection   . CKD (chronic kidney disease) stage 3, GFR 30-59 ml/min   . Postoperative anemia due to acute blood loss 01/04/2016  . Left hip postoperative wound infection 01/02/2016  . Closed intertrochanteric fracture of hip, left, sequela 12/25/2015  . Anemia 12/01/2015  . Ankle fracture, left 09/17/2015  . Ankle fracture, bimalleolar, closed   . Bilateral fibular fractures 09/14/2015  . Hypertensive heart disease 05/20/2015  . CAD (coronary artery disease) 05/20/2015  . Acute systolic CHF (congestive heart failure) ([place]) 05/20/2015  . Anticoagulated on Coumadin 05/20/2015  . Type 2 diabetes mellitus with vascular disease ([place]) 05/17/2015  . NSTEMI (non-ST elevated myocardial infarction) ([place])   . Bacteremia due to Escherichia coli 05/11/2015  . Acute kidney injury superimposed on chronic kidney disease (  [place]) 05/11/2015  . Uncontrolled hypertension 05/11/2015  . 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[place]) 56/43/3295  . PCP NOTES >>>>>>>>>>>>>>>> 12/28/2014  . Depression 05/17/2014  . Memory loss 05/17/2014  . Anxiety and depression, PCP notes  11/24/2013  . Severe obesity (BMI >= 40) ([place]) 07/14/2013  . Encounter for therapeutic drug monitoring 06/02/2013  . Dilantin toxicity 11/21/2012  . History of encephalitis 11/18/2012  . Localization-related (focal) (partial)  epilepsy and epileptic syndromes with complex partial seizures, without mention of intractable epilepsy 09/27/2012  . Intertrochanteric fracture of right hip ([place]) 07/13/2012  . DM (diabetes mellitus) ([place]) 01/19/2012  . GERD (gastroesophageal reflux disease) 10/05/2011  . Shoulder pain 08/27/2011  . Seizure disorder ([place]) 05/24/2011  . Annual physical exam 02/10/2011  . Atrial fibrillation ([place])   . 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 Hyperlipidemia 03/27/2006  . Hereditary and idiopathic peripheral neuropathy 03/27/2006  . 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 Essential hypertension 03/27/2006  . OSTEOPENIA 03/27/2006   Past Medical History:  Diagnosis Date  . Atrial fibrillation ([place])    SVT dx 2007, cath 2007 mild CAD, then had an cardioversion, ablation; still on coumadin , has occ palpitation, EKG 03-2010 NSR  . Blindness of left eye   . DIABETES MELLITUS, TYPE II 03/27/2006   dr [name]  . Eye muscle weakness    Right eye weakness after cataract surgery  . GERD (gastroesophageal reflux disease) 10/05/2011  . 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 HYPERLIPIDEMIA 03/27/2006  . HYPERTENSION 03/27/2006  . Intertrochanteric fracture of right hip ([place]) 07/13/2012  . 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 Osteopenia 2004   Dexa 2004 showed Osteopenia, DEXA 03/2007 normal  . Osteopenia   . Other chronic cystitis with hematuria   . Recurrent urinary tract infection    Seeing Urology  . 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 Seizures ([place])   . 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[place]) 1/88/4166    Family History  Problem Relation Age of Onset  . Diabetes Father   . Heart attack Father 97  . Diabetes Sister   . Drug abuse Son   . Cancer Neg Hx     no hx of colon or breast cancer    Past Surgical History:  Procedure Laterality Date  . Bilateral anke surgery Bilateral   . CARDIAC CATHETERIZATION N/A 05/17/2015   Procedure: Left Heart Cath and Coronary Angiography;  Surgeon: Jettie Booze, MD;   Location: [facility];  Service: Cardiovascular;  Laterality: N/A;  . CARDIAC CATHETERIZATION N/A 05/17/2015   Procedure: Coronary Balloon Angioplasty;  Surgeon: Jettie Booze, MD;  Location: [facility];  Service: Cardiovascular;  Laterality: N/A;  . FEMUR IM NAIL Right 07/15/2012   Procedure: INTRAMEDULLARY (IM) NAIL HIP;  Surgeon: Johnny Bridge, MD;  Location: [facility];  Service: Orthopedics;  Laterality: Right;  . FEMUR IM NAIL Left 12/02/2015   Procedure: INTRAMEDULLARY (IM) NAIL FEMORAL;  Surgeon: [name], MD;  Location: [facility];  Service: Orthopedics;  Laterality: Left;  . INCISION AND DRAINAGE HIP Left 01/03/2016   Procedure: IRRIGATION AND DEBRIDEMENT HIP;  Surgeon: [name], MD;  Location: [facility];  Service: Orthopedics;  Laterality: Left;  . left hip surgery    . SHOULDER SURGERY     Social History   Occupational History  . retired  Retired   Social History Main Topics  . Smoking status: Former Smoker    Types: Cigarettes    Quit date: 03/02/1978  . Smokeless tobacco: Never  Used  . Alcohol use No  . Drug use: No  . Sexual activity: No

## 2016-05-27 LAB — FRUCTOSAMINE: Fructosamine: 389 umol/L — ABNORMAL HIGH (ref 190–270)

## 2016-05-28 ENCOUNTER — Ambulatory Visit (INDEPENDENT_AMBULATORY_CARE_PROVIDER_SITE_OTHER): Payer: Medicare Other | Admitting: Internal Medicine

## 2016-05-28 ENCOUNTER — Encounter: Payer: Self-pay | Admitting: Internal Medicine

## 2016-05-28 VITALS — BP 132/76 | HR 61 | Temp 97.8°F | Resp 14 | Ht 69.0 in | Wt 195.5 lb

## 2016-05-28 DIAGNOSIS — I5022 Chronic systolic (congestive) heart failure: Secondary | ICD-10-CM | POA: Diagnosis not present

## 2016-05-28 DIAGNOSIS — G40909 Epilepsy, unspecified, not intractable, without status epilepticus: Secondary | ICD-10-CM

## 2016-05-28 DIAGNOSIS — F418 Other specified anxiety disorders: Secondary | ICD-10-CM | POA: Diagnosis not present

## 2016-05-28 DIAGNOSIS — F419 Anxiety disorder, unspecified: Secondary | ICD-10-CM

## 2016-05-28 DIAGNOSIS — D649 Anemia, unspecified: Secondary | ICD-10-CM | POA: Diagnosis not present

## 2016-05-28 DIAGNOSIS — I2510 Atherosclerotic heart disease of native coronary artery without angina pectoris: Secondary | ICD-10-CM

## 2016-05-28 DIAGNOSIS — F329 Major depressive disorder, single episode, unspecified: Secondary | ICD-10-CM

## 2016-05-28 LAB — BASIC METABOLIC PANEL
BUN: 38 mg/dL — AB (ref 6–23)
CHLORIDE: 101 meq/L (ref 96–112)
CO2: 29 mEq/L (ref 19–32)
CREATININE: 1.97 mg/dL — AB (ref 0.40–1.20)
Calcium: 9.3 mg/dL (ref 8.4–10.5)
GFR: 26.31 mL/min — AB (ref >60.00)
Glucose, Bld: 192 mg/dL — ABNORMAL HIGH (ref 70–99)
Potassium: 4.3 mEq/L (ref 3.5–5.1)
Sodium: 138 mEq/L (ref 135–145)

## 2016-05-28 NOTE — Progress Notes (Signed)
Pre visit review using our clinic review tool, if applicable. No additional management support is needed unless otherwise documented below in the visit note. 

## 2016-05-28 NOTE — Progress Notes (Signed)
Subjective:    Patient ID: [name] [name], female    DOB: 1940/07/03, 76 y.o.   MRN: [id number]  DOS:  05/28/2016 Type of visit - description : f/u Interval history: Since last OV, Went to the ER with hypoglycemia, she also had hypokalemia. Saw endocrinology, insulin decreased to 25 units daily. SZ d/o-- She decided not to take Keppra, according to the patient it was causing some headache , like a pressure. No seizure activity. Also, she stopped Lexapro and is feeling great emotionally. In general she said this feeling very good.   Review of Systems   Past Medical History:  Diagnosis Date  . Atrial fibrillation ([place])    SVT dx 2007, cath 2007 mild CAD, then had an cardioversion, ablation; still on coumadin , has occ palpitation, EKG 03-2010 NSR  . Blindness of left eye   . DIABETES MELLITUS, TYPE II 03/27/2006   dr [name]  . Eye muscle weakness    Right eye weakness after cataract surgery  . GERD (gastroesophageal reflux disease) 10/05/2011  . 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 HYPERLIPIDEMIA 03/27/2006  . HYPERTENSION 03/27/2006  . Intertrochanteric fracture of right hip ([place]) 07/13/2012  . 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 Osteopenia 2004   Dexa 2004 showed Osteopenia, DEXA 03/2007 normal  . Osteopenia   . Other chronic cystitis with hematuria   . Recurrent urinary tract infection    Seeing Urology  . 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 Seizures ([place])   . 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[place]) 09/09/6267    Past Surgical History:  Procedure Laterality Date  . Bilateral anke surgery Bilateral   . CARDIAC CATHETERIZATION N/A 05/17/2015   Procedure: Left Heart Cath and Coronary Angiography;  Surgeon: Jettie Booze, MD;  Location: [facility];  Service: Cardiovascular;  Laterality: N/A;  . CARDIAC CATHETERIZATION N/A 05/17/2015   Procedure: Coronary Balloon Angioplasty;  Surgeon: Jettie Booze, MD;  Location: [facility];  Service: Cardiovascular;   Laterality: N/A;  . FEMUR IM NAIL Right 07/15/2012   Procedure: INTRAMEDULLARY (IM) NAIL HIP;  Surgeon: Johnny Bridge, MD;  Location: [facility];  Service: Orthopedics;  Laterality: Right;  . FEMUR IM NAIL Left 12/02/2015   Procedure: INTRAMEDULLARY (IM) NAIL FEMORAL;  Surgeon: [name], MD;  Location: [facility];  Service: Orthopedics;  Laterality: Left;  . INCISION AND DRAINAGE HIP Left 01/03/2016   Procedure: IRRIGATION AND DEBRIDEMENT HIP;  Surgeon: [name], MD;  Location: [facility];  Service: Orthopedics;  Laterality: Left;  . left hip surgery    . SHOULDER SURGERY      Social History   Social History  . Marital status: Married    Spouse name: Jenny Reichmann  . Number of children: 1  . Years of education: College   Occupational History  . retired  Retired   Social History Main Topics  . Smoking status: Former Smoker    Types: Cigarettes    Quit date: 03/02/1978  . Smokeless tobacco: Never Used  . Alcohol use No  . Drug use: No  . Sexual activity: No   Other Topics Concern  . Not on file   Social History Narrative   Patient lives at home spouse. Uses a walker consistently.   Moved from [place] 2004   1 son, problems w/ drugs, passed away 03-10-2016-- OD          Allergies as of 05/28/2016      Reactions   Procaine Hcl  Anaphylaxis   Amoxicillin Itching      Medication List       Accurate as of 05/28/16 11:59 PM. Always use your most recent med list.          atorvastatin 80 MG tablet Commonly known as:  LIPITOR Take 1 tablet (80 mg total) by mouth daily.   calcium-vitamin D 500-200 MG-UNIT tablet Commonly known as:  OSCAL WITH D Take 1 tablet by mouth daily.   clopidogrel 75 MG tablet Commonly known as:  PLAVIX Take 1 tablet (75 mg total) by mouth daily.   furosemide 40 MG tablet Commonly known as:  LASIX Take 1 tablet (40 mg total) by mouth 2 (two) times daily.   insulin NPH-regular Human (70-30) 100 UNIT/ML injection Commonly known as:  NOVOLIN 70/30 Inject 25 Units  into the skin daily.   metoprolol succinate 50 MG 24 hr tablet Commonly known as:  TOPROL-XL Take 25 mg by mouth daily. Take with or immediately following a meal.   MULTIVITAMIN ADULT PO Take by mouth.   pantoprazole 40 MG tablet Commonly known as:  PROTONIX Take 1 tablet (40 mg total) by mouth daily.   promethazine 12.5 MG tablet Commonly known as:  PHENERGAN TAKE ONE TABLET BY MOUTH EVERY 6 HOURS AS NEEDED FOR NAUSEA AND VOMITING   protein supplement Liqd Take 30 mLs by mouth 2 (two) times daily.   triamcinolone ointment 0.5 % Commonly known as:  KENALOG Apply 1 application topically 4 (four) times daily. As needed for itching   warfarin 2.5 MG tablet Commonly known as:  COUMADIN TAKE ONE TABLET BY MOUTH ONCE DAILY          Objective:   Physical Exam BP 132/76 (BP Location: Left Arm, Patient Position: Sitting, Cuff Size: Normal)   Pulse 61   Temp 97.8 F (36.6 C) (Oral)   Resp 14   Ht 5\' 9"  (1.753 m)   Wt 195 lb 8 oz (88.7 kg)   SpO2 97%   BMI 28.87 kg/m  General:   Well developed, well nourished . NAD.  HEENT:  Normocephalic . Face symmetric, atraumatic Lungs:  CTA B Normal respiratory effort, no intercostal retractions, no accessory muscle use. Heart: RRR,  no murmur.  No pretibial edema bilaterally  Skin: Not pale. Not jaundice Neurologic:  alert & oriented X3.  Speech normal, gait assisted by a walker Psych--  Cognition and judgment appear intact.  Cooperative with normal attention span and concentration.  Behavior appropriate. No anxious or depressed appearing.      Assessment & Plan:   Assessment   DM  + retinopathy, Dr [name] HTN Hyperlipidemia anxiety ativan GERD Elevated creatinine(started after admission 05-2015, had IV contrast) Anemia, normal iron 05-2015, never had a colonoscopy CV ---NSTEMI 05-2015, cath, angiplasty ---Echo, EF 40 % (05-2015) ---Atrial fibrillation, SVT -- dx 2007, cath 2007 mild CAD. S/p cardioversion, then  ablation, on coumadin, last visit cards 2014  Osteopenia GU Urinary retention /UTI 05-2015, had a foley temporarily, saw urology, PVR 249 cc (high), rx timed voiding Neuro: -Herpes encephalitis 2014 -Seizures (after encephalitis), sz episode 08-2015 (?) - HAs Blindness, left eye Right eye weakness after cataract surgery Osteopenia per DEXA 2004, DEXA 2009 normal H/o lung bx 2008 benign  05-2015- admitted [facility] sepsis  08-2015 admitted  B tibular Fx, no surgery, SNF 12-2015:   admitted, hid FX , surgery 01-2016: admitted, hip surgery infex    PLAN: DM: Per endo, had hypoglycemia, now on 70/30 insulin only 25 units  daily. CHF: On Lasix, seems to be doing very well, she was  supposed to be taking potassium but is not. Check a BMP. High cholesterol: On Lipitor, recheck labs on RTC Anxiety: See previous note, not taking Lexapro and  feeling well emotionally. Seizure disorder: Decided not to take Keppra d/t perceived s/e rec to d/w neurology. Anemia: On iron, recheck on RTC RTC  3 months

## 2016-05-28 NOTE — Patient Instructions (Addendum)
GO TO THE LAB : Get the blood work    GO TO THE FRONT DESK Schedule your next appointment for a  checkup in 3 months  Talk with your neurologist regards the need to go back on [place].

## 2016-05-30 NOTE — Assessment & Plan Note (Signed)
DM: Per endo, had hypoglycemia, now on 70/30 insulin only 25 units daily. CHF: On Lasix, seems to be doing very well, she was  supposed to be taking potassium but is not. Check a BMP. High cholesterol: On Lipitor, recheck labs on RTC Anxiety: See previous note, not taking Lexapro and  feeling well emotionally. Seizure disorder: Decided not to take Keppra d/t perceived s/e rec to d/w neurology. Anemia: On iron, recheck on RTC RTC  3 months

## 2016-06-01 ENCOUNTER — Ambulatory Visit (INDEPENDENT_AMBULATORY_CARE_PROVIDER_SITE_OTHER): Payer: Medicare Other | Admitting: *Deleted

## 2016-06-01 DIAGNOSIS — Z7901 Long term (current) use of anticoagulants: Secondary | ICD-10-CM

## 2016-06-01 DIAGNOSIS — Z5181 Encounter for therapeutic drug level monitoring: Secondary | ICD-10-CM | POA: Diagnosis not present

## 2016-06-01 DIAGNOSIS — I4892 Unspecified atrial flutter: Secondary | ICD-10-CM

## 2016-06-01 DIAGNOSIS — I2510 Atherosclerotic heart disease of native coronary artery without angina pectoris: Secondary | ICD-10-CM

## 2016-06-01 DIAGNOSIS — I4891 Unspecified atrial fibrillation: Secondary | ICD-10-CM

## 2016-06-01 LAB — POCT INR: INR: 4.2

## 2016-06-01 NOTE — Addendum Note (Signed)
Addended byDamita Dunnings D on: 06/01/2016 11:23 AM   Modules accepted: Orders

## 2016-06-09 ENCOUNTER — Other Ambulatory Visit: Payer: Self-pay | Admitting: Internal Medicine

## 2016-06-11 ENCOUNTER — Ambulatory Visit: Payer: Medicare Other | Admitting: Internal Medicine

## 2016-06-15 ENCOUNTER — Inpatient Hospital Stay (HOSPITAL_COMMUNITY)
Admission: EM | Admit: 2016-06-15 | Discharge: 2016-06-18 | DRG: 536 | Disposition: A | Discharge status: Home Health | Payer: Medicare Other | Attending: Internal Medicine | Admitting: Internal Medicine

## 2016-06-15 ENCOUNTER — Encounter (HOSPITAL_COMMUNITY): Payer: Self-pay | Admitting: Emergency Medicine

## 2016-06-15 ENCOUNTER — Emergency Department (HOSPITAL_COMMUNITY): Payer: Medicare Other

## 2016-06-15 DIAGNOSIS — I5022 Chronic systolic (congestive) heart failure: Secondary | ICD-10-CM | POA: Diagnosis present

## 2016-06-15 DIAGNOSIS — I4891 Unspecified atrial fibrillation: Secondary | ICD-10-CM | POA: Diagnosis present

## 2016-06-15 DIAGNOSIS — D649 Anemia, unspecified: Secondary | ICD-10-CM | POA: Diagnosis present

## 2016-06-15 DIAGNOSIS — I2583 Coronary atherosclerosis due to lipid rich plaque: Secondary | ICD-10-CM | POA: Diagnosis not present

## 2016-06-15 DIAGNOSIS — M8580 Other specified disorders of bone density and structure, unspecified site: Secondary | ICD-10-CM | POA: Diagnosis present

## 2016-06-15 DIAGNOSIS — E785 Hyperlipidemia, unspecified: Secondary | ICD-10-CM | POA: Diagnosis present

## 2016-06-15 DIAGNOSIS — Z7902 Long term (current) use of antithrombotics/antiplatelets: Secondary | ICD-10-CM

## 2016-06-15 DIAGNOSIS — Z87891 Personal history of nicotine dependence: Secondary | ICD-10-CM | POA: Diagnosis not present

## 2016-06-15 DIAGNOSIS — I10 Essential (primary) hypertension: Secondary | ICD-10-CM | POA: Diagnosis not present

## 2016-06-15 DIAGNOSIS — Z9181 History of falling: Secondary | ICD-10-CM | POA: Diagnosis not present

## 2016-06-15 DIAGNOSIS — Z8249 Family history of ischemic heart disease and other diseases of the circulatory system: Secondary | ICD-10-CM

## 2016-06-15 DIAGNOSIS — E871 Hypo-osmolality and hyponatremia: Secondary | ICD-10-CM | POA: Diagnosis present

## 2016-06-15 DIAGNOSIS — E1165 Type 2 diabetes mellitus with hyperglycemia: Secondary | ICD-10-CM | POA: Diagnosis present

## 2016-06-15 DIAGNOSIS — Z794 Long term (current) use of insulin: Secondary | ICD-10-CM

## 2016-06-15 DIAGNOSIS — N183 Chronic kidney disease, stage 3 (moderate): Secondary | ICD-10-CM | POA: Diagnosis present

## 2016-06-15 DIAGNOSIS — S32591A Other specified fracture of right pubis, initial encounter for closed fracture: Principal | ICD-10-CM | POA: Diagnosis present

## 2016-06-15 DIAGNOSIS — N179 Acute kidney failure, unspecified: Secondary | ICD-10-CM | POA: Diagnosis present

## 2016-06-15 DIAGNOSIS — E11319 Type 2 diabetes mellitus with unspecified diabetic retinopathy without macular edema: Secondary | ICD-10-CM | POA: Diagnosis present

## 2016-06-15 DIAGNOSIS — D631 Anemia in chronic kidney disease: Secondary | ICD-10-CM | POA: Diagnosis not present

## 2016-06-15 DIAGNOSIS — Y92009 Unspecified place in unspecified non-institutional (private) residence as the place of occurrence of the external cause: Secondary | ICD-10-CM

## 2016-06-15 DIAGNOSIS — Z7901 Long term (current) use of anticoagulants: Secondary | ICD-10-CM

## 2016-06-15 DIAGNOSIS — K219 Gastro-esophageal reflux disease without esophagitis: Secondary | ICD-10-CM | POA: Diagnosis present

## 2016-06-15 DIAGNOSIS — E1122 Type 2 diabetes mellitus with diabetic chronic kidney disease: Secondary | ICD-10-CM | POA: Diagnosis present

## 2016-06-15 DIAGNOSIS — I482 Chronic atrial fibrillation: Secondary | ICD-10-CM | POA: Diagnosis not present

## 2016-06-15 DIAGNOSIS — I130 Hypertensive heart and chronic kidney disease with heart failure and stage 1 through stage 4 chronic kidney disease, or unspecified chronic kidney disease: Secondary | ICD-10-CM | POA: Diagnosis present

## 2016-06-15 DIAGNOSIS — N189 Chronic kidney disease, unspecified: Secondary | ICD-10-CM | POA: Diagnosis present

## 2016-06-15 DIAGNOSIS — S3210XA Unspecified fracture of sacrum, initial encounter for closed fracture: Secondary | ICD-10-CM | POA: Diagnosis not present

## 2016-06-15 DIAGNOSIS — S322XXA Fracture of coccyx, initial encounter for closed fracture: Secondary | ICD-10-CM

## 2016-06-15 DIAGNOSIS — S32810A Multiple fractures of pelvis with stable disruption of pelvic ring, initial encounter for closed fracture: Secondary | ICD-10-CM | POA: Diagnosis not present

## 2016-06-15 DIAGNOSIS — Z833 Family history of diabetes mellitus: Secondary | ICD-10-CM

## 2016-06-15 DIAGNOSIS — I2510 Atherosclerotic heart disease of native coronary artery without angina pectoris: Secondary | ICD-10-CM | POA: Diagnosis present

## 2016-06-15 DIAGNOSIS — W010XXA Fall on same level from slipping, tripping and stumbling without subsequent striking against object, initial encounter: Secondary | ICD-10-CM | POA: Diagnosis present

## 2016-06-15 DIAGNOSIS — N390 Urinary tract infection, site not specified: Secondary | ICD-10-CM | POA: Diagnosis present

## 2016-06-15 DIAGNOSIS — E860 Dehydration: Secondary | ICD-10-CM | POA: Diagnosis present

## 2016-06-15 DIAGNOSIS — Z5181 Encounter for therapeutic drug level monitoring: Secondary | ICD-10-CM | POA: Diagnosis not present

## 2016-06-15 DIAGNOSIS — Z9861 Coronary angioplasty status: Secondary | ICD-10-CM

## 2016-06-15 HISTORY — DX: Other amnesia: R41.3

## 2016-06-15 HISTORY — DX: Chronic kidney disease, stage 3 (moderate): N18.3

## 2016-06-15 HISTORY — DX: Fracture of unspecified parts of lumbosacral spine and pelvis, initial encounter for closed fracture: S32.9XXA

## 2016-06-15 LAB — CBC
HCT: 36.6 % (ref 36.0–46.0)
HEMOGLOBIN: 12.1 g/dL (ref 12.0–15.0)
MCH: 29.6 pg (ref 26.0–34.0)
MCHC: 33.1 g/dL (ref 30.0–36.0)
MCV: 89.5 fL (ref 78.0–100.0)
Platelets: 216 10*3/uL (ref 150–400)
RBC: 4.09 MIL/uL (ref 3.87–5.11)
RDW: 13.7 % (ref 11.5–15.5)
WBC: 9.9 10*3/uL (ref 4.0–10.5)

## 2016-06-15 LAB — BASIC METABOLIC PANEL WITH GFR
Anion gap: 13 (ref 5–15)
BUN: 35 mg/dL — ABNORMAL HIGH (ref 6–20)
CO2: 27 mmol/L (ref 22–32)
Calcium: 8.9 mg/dL (ref 8.9–10.3)
Chloride: 93 mmol/L — ABNORMAL LOW (ref 101–111)
Creatinine, Ser: 2.33 mg/dL — ABNORMAL HIGH (ref 0.44–1.00)
GFR calc Af Amer: 23 mL/min — ABNORMAL LOW
GFR calc non Af Amer: 19 mL/min — ABNORMAL LOW
Glucose, Bld: 390 mg/dL — ABNORMAL HIGH (ref 65–99)
Potassium: 3.9 mmol/L (ref 3.5–5.1)
Sodium: 133 mmol/L — ABNORMAL LOW (ref 135–145)

## 2016-06-15 LAB — PROTIME-INR
INR: 4.58
PROTHROMBIN TIME: 44.6 s — AB (ref 11.4–15.2)

## 2016-06-15 MED ORDER — SODIUM CHLORIDE 0.9 % IV SOLN
INTRAVENOUS | Status: DC
  Administered 2016-06-15: 22:00:00 via INTRAVENOUS
  Administered 2016-06-16: 75 mL via INTRAVENOUS
  Administered 2016-06-17: 18:00:00 via INTRAVENOUS

## 2016-06-15 MED ORDER — HYDROMORPHONE HCL 1 MG/ML IJ SOLN: 0.5000 mg | INTRAMUSCULAR | Status: DC | PRN

## 2016-06-15 MED ORDER — SODIUM CHLORIDE 0.9% FLUSH
3.0000 mL | Freq: Two times a day (BID) | INTRAVENOUS | Status: DC
  Administered 2016-06-16 – 2016-06-17 (×3): 3 mL via INTRAVENOUS

## 2016-06-15 MED ORDER — CLOPIDOGREL BISULFATE 75 MG PO TABS
75.0000 mg | ORAL_TABLET | Freq: Every day | ORAL | Status: DC
  Administered 2016-06-15 – 2016-06-18 (×4): 75 mg via ORAL
  Filled 2016-06-15 (×4): qty 1

## 2016-06-15 MED ORDER — INSULIN ASPART 100 UNIT/ML ~~LOC~~ SOLN
0.0000 [IU] | Freq: Every day | SUBCUTANEOUS | Status: DC
  Administered 2016-06-15: 4 [IU] via SUBCUTANEOUS
  Administered 2016-06-16: 3 [IU] via SUBCUTANEOUS

## 2016-06-15 MED ORDER — COUMADIN BOOK
Freq: Once | Status: AC
  Administered 2016-06-18: 11:00:00
  Filled 2016-06-15 (×2): qty 1

## 2016-06-15 MED ORDER — POLYETHYLENE GLYCOL 3350 17 G PO PACK: 17.0000 g | PACK | Freq: Every day | ORAL | Status: DC | PRN

## 2016-06-15 MED ORDER — SODIUM CHLORIDE 0.9 % IV BOLUS (SEPSIS): 1000.0000 mL | Freq: Once | INTRAVENOUS | Status: DC

## 2016-06-15 MED ORDER — FUROSEMIDE 20 MG PO TABS
40.0000 mg | ORAL_TABLET | Freq: Two times a day (BID) | ORAL | Status: DC
  Administered 2016-06-16: 40 mg via ORAL
  Filled 2016-06-15: qty 2

## 2016-06-15 MED ORDER — METOPROLOL SUCCINATE ER 25 MG PO TB24
25.0000 mg | ORAL_TABLET | Freq: Every day | ORAL | Status: DC
  Administered 2016-06-15 – 2016-06-18 (×4): 25 mg via ORAL
  Filled 2016-06-15 (×4): qty 1

## 2016-06-15 MED ORDER — SODIUM CHLORIDE 0.9 % IV BOLUS (SEPSIS): 500.0000 mL | Freq: Once | INTRAVENOUS | Status: DC

## 2016-06-15 MED ORDER — INSULIN ASPART 100 UNIT/ML ~~LOC~~ SOLN
0.0000 [IU] | Freq: Three times a day (TID) | SUBCUTANEOUS | Status: DC
  Administered 2016-06-16: 9 [IU] via SUBCUTANEOUS
  Administered 2016-06-16 (×2): 7 [IU] via SUBCUTANEOUS
  Administered 2016-06-17: 2 [IU] via SUBCUTANEOUS
  Administered 2016-06-17: 3 [IU] via SUBCUTANEOUS
  Administered 2016-06-17 – 2016-06-18 (×2): 2 [IU] via SUBCUTANEOUS
  Administered 2016-06-18: 3 [IU] via SUBCUTANEOUS

## 2016-06-15 MED ORDER — PROSOURCE NO CARB PO LIQD
30.0000 mL | Freq: Two times a day (BID) | ORAL | Status: DC
  Filled 2016-06-15 (×20): qty 30

## 2016-06-15 MED ORDER — CALCIUM CARBONATE-VITAMIN D 500-200 MG-UNIT PO TABS
1.0000 | ORAL_TABLET | Freq: Every day | ORAL | Status: DC
  Administered 2016-06-16 – 2016-06-18 (×3): 1 via ORAL
  Filled 2016-06-15 (×3): qty 1

## 2016-06-15 MED ORDER — ONDANSETRON HCL 4 MG PO TABS
4.0000 mg | ORAL_TABLET | Freq: Four times a day (QID) | ORAL | Status: DC | PRN
  Administered 2016-06-17: 4 mg via ORAL
  Filled 2016-06-15: qty 1

## 2016-06-15 MED ORDER — TRAZODONE HCL 50 MG PO TABS: 25.0000 mg | ORAL_TABLET | Freq: Every evening | ORAL | Status: DC | PRN

## 2016-06-15 MED ORDER — PRO-STAT SUGAR FREE PO LIQD
30.0000 mL | Freq: Two times a day (BID) | ORAL | Status: DC
  Administered 2016-06-15 – 2016-06-18 (×6): 30 mL via ORAL
  Filled 2016-06-15 (×6): qty 30

## 2016-06-15 MED ORDER — HYDROCODONE-ACETAMINOPHEN 5-325 MG PO TABS: 2.0000 | ORAL_TABLET | Freq: Four times a day (QID) | ORAL | Status: DC | PRN

## 2016-06-15 MED ORDER — FENTANYL CITRATE (PF) 100 MCG/2ML IJ SOLN
50.0000 ug | Freq: Once | INTRAMUSCULAR | Status: DC
  Filled 2016-06-15: qty 2

## 2016-06-15 MED ORDER — ATORVASTATIN CALCIUM 80 MG PO TABS
80.0000 mg | ORAL_TABLET | Freq: Every day | ORAL | Status: DC
  Administered 2016-06-15 – 2016-06-17 (×3): 80 mg via ORAL
  Filled 2016-06-15 (×3): qty 1

## 2016-06-15 MED ORDER — ONDANSETRON HCL 4 MG/2ML IJ SOLN: 4.0000 mg | Freq: Four times a day (QID) | INTRAMUSCULAR | Status: DC | PRN

## 2016-06-15 MED ORDER — HYDROCODONE-ACETAMINOPHEN 5-325 MG PO TABS
2.0000 | ORAL_TABLET | Freq: Once | ORAL | Status: AC
  Administered 2016-06-15: 2 via ORAL
  Filled 2016-06-15: qty 2

## 2016-06-15 MED ORDER — PANTOPRAZOLE SODIUM 40 MG PO TBEC
40.0000 mg | DELAYED_RELEASE_TABLET | Freq: Every day | ORAL | Status: DC
  Administered 2016-06-16 – 2016-06-18 (×3): 40 mg via ORAL
  Filled 2016-06-15 (×3): qty 1

## 2016-06-15 NOTE — ED Notes (Signed)
Floor called twice to give report.  Nurse unable to take report.  Bedside report with be done with the patient.  Belongings taken to the floor with the patient.

## 2016-06-15 NOTE — ED Notes (Signed)
Husband arrived and Doctor at bedside.

## 2016-06-15 NOTE — ED Provider Notes (Signed)
[facility] DEPT Provider Note   CSN: [id number] Arrival date & time: 06/15/16  1017     History   Chief Complaint Chief Complaint  Patient presents with  . Fall  . Hip Pain    HPI [name] [name] is a 76 y.o. female.  HPI 76 yo F with extensive PMHx as below including recurrent fall, HTN, HLD here with right hip pain after fall. Pt has h/o recurrent injuries 2/2 falls of unknown origin. She states that she was walking in her house around 9 AM when she fell and landed onto her right hip. She reports immediate onset of severe right hip pain. She has been unable to ambulate on it. Pain worse with movement and palpation. No numbness or weakness. Denies any other injuries. Specifically, no headache or head injury. Has not tried and medications for this. She has a h/o hip fx on both sides.   Past Medical History:  Diagnosis Date  . Atrial fibrillation ([place])    SVT dx 2007, cath 2007 mild CAD, then had an cardioversion, ablation; still on coumadin , has occ palpitation, EKG 03-2010 NSR  . Blindness of left eye   . DIABETES MELLITUS, TYPE II 03/27/2006   dr [name]  . Eye muscle weakness    Right eye weakness after cataract surgery  . GERD (gastroesophageal reflux disease) 10/05/2011  . 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 HYPERLIPIDEMIA 03/27/2006  . HYPERTENSION 03/27/2006  . Intertrochanteric fracture of right hip ([place]) 07/13/2012  . 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 Osteopenia 2004   Dexa 2004 showed Osteopenia, DEXA 03/2007 normal  . Osteopenia   . Other chronic cystitis with hematuria   . Recurrent urinary tract infection    Seeing Urology  . 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 Seizures ([place])   . 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[place]) 7/51/0258    Patient Active Problem List   Diagnosis Date Noted  . Pruritus 02/18/2016  . Decreased strength of lower extremity   . Gram-negative infection   . CKD (chronic kidney disease) stage 3, GFR 30-59 ml/min   . Postoperative anemia due  to acute blood loss 01/04/2016  . Left hip postoperative wound infection 01/02/2016  . Closed intertrochanteric fracture of hip, left, sequela 12/25/2015  . Anemia 12/01/2015  . Ankle fracture, left 09/17/2015  . Ankle fracture, bimalleolar, closed   . Bilateral fibular fractures 09/14/2015  . Hypertensive heart disease 05/20/2015  . CAD (coronary artery disease) 05/20/2015  . Acute systolic CHF (congestive heart failure) ([place]) 05/20/2015  . Anticoagulated on Coumadin 05/20/2015  . Type 2 diabetes mellitus with vascular disease ([place]) 05/17/2015  . NSTEMI (non-ST elevated myocardial infarction) ([place])   . Bacteremia due to Escherichia coli 05/11/2015  . Acute kidney injury superimposed on chronic kidney disease ([place]) 05/11/2015  . Uncontrolled hypertension 05/11/2015  . 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[place]) 52/77/8242  . PCP NOTES >>>>>>>>>>>>>>>> 12/28/2014  . Depression 05/17/2014  . Memory loss 05/17/2014  . Anxiety and depression, PCP notes  11/24/2013  . Severe obesity (BMI >= 40) ([place]) 07/14/2013  . Encounter for therapeutic drug monitoring 06/02/2013  . Dilantin toxicity 11/21/2012  . History of encephalitis 11/18/2012  . Localization-related (focal) (partial) epilepsy and epileptic syndromes with complex partial seizures, without mention of intractable epilepsy 09/27/2012  . Intertrochanteric fracture of right hip ([place]) 07/13/2012  . DM (diabetes mellitus) ([place]) 01/19/2012  . GERD (gastroesophageal reflux disease) 10/05/2011  . Shoulder pain 08/27/2011  . Seizure disorder ([place]) 05/24/2011  . Annual physical exam  02/10/2011  . Atrial fibrillation ([place])   . 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 Hyperlipidemia 03/27/2006  . Hereditary and idiopathic peripheral neuropathy 03/27/2006  . 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 Essential hypertension 03/27/2006  . OSTEOPENIA 03/27/2006    Past Surgical History:  Procedure Laterality Date  . Bilateral anke surgery Bilateral   . CARDIAC CATHETERIZATION  N/A 05/17/2015   Procedure: Left Heart Cath and Coronary Angiography;  Surgeon: Jettie Booze, MD;  Location: [facility];  Service: Cardiovascular;  Laterality: N/A;  . CARDIAC CATHETERIZATION N/A 05/17/2015   Procedure: Coronary Balloon Angioplasty;  Surgeon: Jettie Booze, MD;  Location: [facility];  Service: Cardiovascular;  Laterality: N/A;  . FEMUR IM NAIL Right 07/15/2012   Procedure: INTRAMEDULLARY (IM) NAIL HIP;  Surgeon: Johnny Bridge, MD;  Location: [facility];  Service: Orthopedics;  Laterality: Right;  . FEMUR IM NAIL Left 12/02/2015   Procedure: INTRAMEDULLARY (IM) NAIL FEMORAL;  Surgeon: [name], MD;  Location: [facility];  Service: Orthopedics;  Laterality: Left;  . INCISION AND DRAINAGE HIP Left 01/03/2016   Procedure: IRRIGATION AND DEBRIDEMENT HIP;  Surgeon: [name], MD;  Location: [facility];  Service: Orthopedics;  Laterality: Left;  . left hip surgery    . SHOULDER SURGERY      OB History    No data available       Home Medications    Prior to Admission medications   Medication Sig Start Date End Date Taking? Authorizing Provider  atorvastatin (LIPITOR) 80 MG tablet Take 1 tablet (80 mg total) by mouth daily. 04/10/16   Colon Branch, MD  calcium-vitamin D (OSCAL WITH D) 500-200 MG-UNIT per tablet Take 1 tablet by mouth daily.    Historical Provider, MD  clopidogrel (PLAVIX) 75 MG tablet Take 1 tablet (75 mg total) by mouth daily. 04/10/16   Colon Branch, MD  furosemide (LASIX) 40 MG tablet Take 1 tablet (40 mg total) by mouth 2 (two) times daily. 04/10/16   Colon Branch, MD  insulin NPH-regular Human (NOVOLIN 70/30) (70-30) 100 UNIT/ML injection Inject 25 Units into the skin daily.    Historical Provider, MD  metoprolol succinate (TOPROL-XL) 50 MG 24 hr tablet Take 25 mg by mouth daily. Take with or immediately following a meal.    Historical Provider, MD  Multiple Vitamins-Minerals (MULTIVITAMIN ADULT PO) Take by mouth.    Historical Provider, MD    pantoprazole (PROTONIX) 40 MG tablet Take 1 tablet (40 mg total) by mouth daily. 05/20/16   Colon Branch, MD  promethazine (PHENERGAN) 12.5 MG tablet Take 1 tablet (12.5 mg total) by mouth every 6 (six) hours as needed for nausea or vomiting. 06/09/16   Colon Branch, MD  protein supplement (PROSOURCE NO CARB) LIQD Take 30 mLs by mouth 2 (two) times daily.    Historical Provider, MD  triamcinolone ointment (KENALOG) 0.5 % Apply 1 application topically 4 (four) times daily. As needed for itching 05/25/16   Renato Shin, MD  warfarin (COUMADIN) 2.5 MG tablet TAKE ONE TABLET BY MOUTH ONCE DAILY 05/20/16   Evans Lance, MD    Family History Family History  Problem Relation Age of Onset  . Diabetes Father   . Heart attack Father 84  . Diabetes Sister   . Drug abuse Son   . Cancer Neg Hx     no hx of colon or breast cancer    Social History Social History  Substance Use  Topics  . Smoking status: Former Smoker    Types: Cigarettes    Quit date: 03/02/1978  . Smokeless tobacco: Never Used  . Alcohol use No     Allergies   Procaine hcl and Amoxicillin   Review of Systems Review of Systems  Constitutional: Negative for chills, fatigue and fever.  HENT: Negative for congestion and rhinorrhea.   Eyes: Negative for visual disturbance.  Respiratory: Negative for cough, shortness of breath and wheezing.   Cardiovascular: Negative for chest pain and leg swelling.  Gastrointestinal: Negative for abdominal pain, diarrhea, nausea and vomiting.  Genitourinary: Negative for dysuria and flank pain.  Musculoskeletal: Positive for arthralgias and gait problem. Negative for neck pain and neck stiffness.  Skin: Negative for rash and wound.  Allergic/Immunologic: Negative for immunocompromised state.  Neurological: Negative for syncope, weakness and headaches.  All other systems reviewed and are negative.    Physical Exam Updated Vital Signs BP 127/78 (BP Location: Right Arm)   Pulse 98   Temp  97.7 F (36.5 C) (Oral)   Resp 18   Ht 5\' 9"  (1.753 m)   Wt 179 lb (81.2 kg)   SpO2 98%   BMI 26.43 kg/m   Physical Exam  Constitutional: She is oriented to person, place, and time. She appears well-developed and well-nourished. No distress.  HENT:  Head: Normocephalic and atraumatic.  Eyes: Conjunctivae are normal.  Neck: Neck supple.  Cardiovascular: Normal rate, regular rhythm and normal heart sounds.  Exam reveals no friction rub.   No murmur heard. Pulmonary/Chest: Effort normal and breath sounds normal. No respiratory distress. She has no wheezes. She has no rales.  Abdominal: She exhibits no distension.  Musculoskeletal: She exhibits no edema.  Neurological: She is alert and oriented to person, place, and time. She exhibits normal muscle tone.  Skin: Skin is warm. Capillary refill takes less than 2 seconds.  Psychiatric: She has a normal mood and affect.  Nursing note and vitals reviewed.   LOWER EXTREMITY EXAM: RIGHT  INSPECTION & PALPATION: Moderate TTP over lateral hip/greater trochanter. Pain with flexion of hip as well as internal/external rotation. No shortening or malrotation. No bruising or open wounds.  SENSORY: sensation is intact to light touch in:  Superficial peroneal nerve distribution (over dorsum of foot) Deep peroneal nerve distribution (over first dorsal web space) Sural nerve distribution (over lateral aspect 5th metatarsal) Saphenous nerve distribution (over medial instep)  MOTOR:  + Motor EHL (great toe dorsiflexion) + FHL (great toe plantar flexion)  + TA (ankle dorsiflexion)  + GSC (ankle plantar flexion)  VASCULAR: 2+ dorsalis pedis and posterior tibialis pulses Capillary refill < 2 sec, toes warm and well-perfused  COMPARTMENTS: Soft, warm, well-perfused No pain with passive extension No parethesias    ED Treatments / Results  Labs (all labs ordered are listed, but only abnormal results are displayed) Labs Reviewed - No data to  display  EKG  EKG Interpretation None       Radiology No results found.  Procedures Procedures (including critical care time)  Medications Ordered in ED Medications - No data to display   Initial Impression / Assessment and Plan / ED Course  I have reviewed the triage vital signs and the nursing notes.  Pertinent labs & imaging results that were available during my care of the patient were reviewed by me and considered in my medical decision making (see chart for details).     76 yo F with h/o recurrent falls here with right hip  pain sp mechanical fall. On arrival, VSS. She was in usual state of health prior to the fall. On exam, pt has marked TTP over R lateral hip and has difficulty ambulating. Neurovascularly intact. Given h/o recurrent stress fx and ongoing pain, will obtain CT, basic labs. Plan to re-assess pending labs, sx control.  CT scan shows sacral and pubic ramus fx. Lab work shows moderate AoCKD, supratherapeutic INR. Strongly denies any head injury or HA. Discussed with Dr. Lorin Mercy, Frederik Pear, who will evaluate pt as inpt. Likely WBAT with pain control. Will admit for pain control, gentle hydration, PT/OT.  Final Clinical Impressions(s) / ED Diagnoses   Final diagnoses:  Closed fracture of sacrum and coccyx, initial encounter ([place])  Closed fracture of ramus of right pubis, initial encounter ([place])  Acute renal failure superimposed on chronic kidney disease, unspecified CKD stage, unspecified acute renal failure type ([facility])     Duffy Bruce, MD 06/15/16 [phone]

## 2016-06-15 NOTE — Progress Notes (Signed)
Patient ID: [name] [name], female   DOB: 1941/02/25, 76 y.o.   MRN: [id number] xrays and CT pelvis reviewed. Conserv Tx of pelvic ring fx with therapy starting in a couple days for mobilization with physical therapy. Will see in AM for consultation. My cell [phone]   Rodell Perna MD

## 2016-06-15 NOTE — H&P (Signed)
History and Physical    [name] [name] WJX:[id number] DOB: 06-26-1940 DOA: 06/15/2016  PCP: [name], MD  Patient coming from: home  Chief Complaint: fall  HPI: [name] [name] is a 76 y.o. female with medical history significant Afib on Coumadin anticoagulation therapy, nonobstructive CAD, HTN, HLD , diabetes mellitus, GERD, with previous history of falls resulting in bilateral hip fractures and left ankle fracture , here with right hip pain after fall.who presented to the ED after she sustained a fall earlier at home. Patient is adamant about not hitting her head. Patient believed that it was a mechanical fall and presented to the ED with severe right hip.  ED Course: On presentation her vital signs were stable, blood work showed mild hyponatremia with sodium 133, her creatinine was elevated at 2.33 and BUN was 35, INR was also supratherapeutic-4.5.[name]  Initial x-ray of the pelvis and he acute abnormality, but due to severe persistent pain patient underwent right hip CT that showed acute right sacral and the right inferior pubic ramus nondisplaced fractures   Review of Systems: As per HPI otherwise 10 point review of systems negative.   Ambulatory Status: Independent  Past Medical History:  Diagnosis Date  . Atrial fibrillation ([place])    SVT dx 2007, cath 2007 mild CAD, then had an cardioversion, ablation; still on coumadin , has occ palpitation, EKG 03-2010 NSR  . Blindness of left eye   . DIABETES MELLITUS, TYPE II 03/27/2006   dr [name]  . Eye muscle weakness    Right eye weakness after cataract surgery  . GERD (gastroesophageal reflux disease) 10/05/2011  . 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 HYPERLIPIDEMIA 03/27/2006  . HYPERTENSION 03/27/2006  . Intertrochanteric fracture of right hip ([place]) 07/13/2012  . 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 Osteopenia 2004   Dexa 2004 showed Osteopenia, DEXA 03/2007 normal  . Osteopenia   . Other chronic cystitis with hematuria   .  Recurrent urinary tract infection    Seeing Urology  . 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 Seizures ([place])   . 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[place]) 04/15/863    Past Surgical History:  Procedure Laterality Date  . Bilateral anke surgery Bilateral   . CARDIAC CATHETERIZATION N/A 05/17/2015   Procedure: Left Heart Cath and Coronary Angiography;  Surgeon: Jettie Booze, MD;  Location: [facility];  Service: Cardiovascular;  Laterality: N/A;  . CARDIAC CATHETERIZATION N/A 05/17/2015   Procedure: Coronary Balloon Angioplasty;  Surgeon: Jettie Booze, MD;  Location: [facility];  Service: Cardiovascular;  Laterality: N/A;  . FEMUR IM NAIL Right 07/15/2012   Procedure: INTRAMEDULLARY (IM) NAIL HIP;  Surgeon: Johnny Bridge, MD;  Location: [facility];  Service: Orthopedics;  Laterality: Right;  . FEMUR IM NAIL Left 12/02/2015   Procedure: INTRAMEDULLARY (IM) NAIL FEMORAL;  Surgeon: [name], MD;  Location: [facility];  Service: Orthopedics;  Laterality: Left;  . INCISION AND DRAINAGE HIP Left 01/03/2016   Procedure: IRRIGATION AND DEBRIDEMENT HIP;  Surgeon: [name], MD;  Location: [facility];  Service: Orthopedics;  Laterality: Left;  . left hip surgery    . SHOULDER SURGERY      Social History   Social History  . Marital status: Married    Spouse name: Jenny Reichmann  . Number of children: 1  . Years of education: College   Occupational History  . retired  Retired   Social History Main Topics  . Smoking status: Former Smoker  Types: Cigarettes    Quit date: 03/02/1978  . Smokeless tobacco: Never Used  . Alcohol use No  . Drug use: No  . Sexual activity: No   Other Topics Concern  . Not on file   Social History Narrative   Patient lives at home spouse. Uses a walker consistently.   Moved from [place] 2004   1 son, problems w/ drugs, passed away 14-Mar-2016-- OD        Allergies  Allergen Reactions  . Procaine Hcl Anaphylaxis  . Amoxicillin Itching    Family History  Problem  Relation Age of Onset  . Diabetes Father   . Heart attack Father 43  . Diabetes Sister   . Drug abuse Son   . Cancer Neg Hx     no hx of colon or breast cancer    Prior to Admission medications   Medication Sig Start Date End Date Taking? Authorizing Provider  atorvastatin (LIPITOR) 80 MG tablet Take 1 tablet (80 mg total) by mouth daily. 04/10/16   Colon Branch, MD  calcium-vitamin D (OSCAL WITH D) 500-200 MG-UNIT per tablet Take 1 tablet by mouth daily.    Historical Provider, MD  clopidogrel (PLAVIX) 75 MG tablet Take 1 tablet (75 mg total) by mouth daily. 04/10/16   Colon Branch, MD  furosemide (LASIX) 40 MG tablet Take 1 tablet (40 mg total) by mouth 2 (two) times daily. 04/10/16   Colon Branch, MD  insulin NPH-regular Human (NOVOLIN 70/30) (70-30) 100 UNIT/ML injection Inject 25 Units into the skin daily.    Historical Provider, MD  metoprolol succinate (TOPROL-XL) 50 MG 24 hr tablet Take 25 mg by mouth daily. Take with or immediately following a meal.    Historical Provider, MD  Multiple Vitamins-Minerals (MULTIVITAMIN ADULT PO) Take by mouth.    Historical Provider, MD  pantoprazole (PROTONIX) 40 MG tablet Take 1 tablet (40 mg total) by mouth daily. 05/20/16   Colon Branch, MD  promethazine (PHENERGAN) 12.5 MG tablet Take 1 tablet (12.5 mg total) by mouth every 6 (six) hours as needed for nausea or vomiting. 06/09/16   Colon Branch, MD  protein supplement (PROSOURCE NO CARB) LIQD Take 30 mLs by mouth 2 (two) times daily.    Historical Provider, MD  triamcinolone ointment (KENALOG) 0.5 % Apply 1 application topically 4 (four) times daily. As needed for itching 05/25/16   Renato Shin, MD  warfarin (COUMADIN) 2.5 MG tablet TAKE ONE TABLET BY MOUTH ONCE DAILY 05/20/16   Evans Lance, MD    Physical Exam: Vitals:   06/15/16 1130 06/15/16 1435 06/15/16 1500 06/15/16 1700  BP: 117/64 129/79 120/70   Pulse: 78 (!) 102 88   Resp: 16 18 20    Temp:      TempSrc:      SpO2: 95% 95% 94% 94%  Weight:        Height:         General: Appears calm and comfortable Eyes: PERRLA, EOMI, normal lids, iris ENT:  grossly normal hearing, lips & tongue, mucous membranes moist and intact Neck: no lymphoadenopathy, masses or thyromegaly Cardiovascular: RRR, no m/r/g. No JVD, carotid bruits. No LE edema.  Respiratory: bilateral no wheezes, rales, rhonchi or cracles. Normal respiratory effort. No accessory muscle use observed Abdomen: soft, non-tender, non-distended, no organomegaly or masses appreciated. BS present in all quadrants Skin: no rash, ulcers or induration seen on limited exam Musculoskeletal: grossly normal tone BUE/BLE, good ROM, no bony abnormality or  joint deformities observed Psychiatric: grossly normal mood and affect, speech fluent and appropriate, alert and oriented x3 Neurologic: CN II-XII grossly intact, moves all extremities in coordinated fashion, sensation intact  Labs on Admission: I have personally reviewed following labs and imaging studies  CBC, BMP  GFR: Estimated Creatinine Clearance: 24.1 mL/min (A) (by C-G formula based on SCr of 2.33 mg/dL (H)).   Creatinine Clearance: Estimated Creatinine Clearance: 24.1 mL/min (A) (by C-G formula based on SCr of 2.33 mg/dL (H)).    Radiological Exams on Admission: Ct Hip Right Wo Contrast  Result Date: 06/15/2016 CLINICAL DATA:  Status post fall today with a right hip injury. The patient is unable to walk. History of prior fixation of a right hip fracture. Initial encounter. EXAM: CT OF THE RIGHT HIP WITHOUT CONTRAST TECHNIQUE: Multidetector CT imaging of the right hip was performed according to the standard protocol. Multiplanar CT image reconstructions were also generated. COMPARISON:  Plain films right hip earlier today. CT abdomen and pelvis 05/20/2015. FINDINGS: Bones/Joint/Cartilage Intramedullary nail and hip screw are in place for fixation of a remote healed subtrochanteric fracture on the right. The patient has an acute  nondisplaced right sacral fracture. Also seen is an acute right inferior pubic ramus fracture. No other acute fracture is identified. The right hip is located. Lower lumbar spondylosis is noted. Ligaments Suboptimally assessed by CT. Muscles and Tendons Negative. Soft tissues Extensive aortoiliac atherosclerosis is identified. No acute intrapelvic abnormality is seen. IMPRESSION: Acute nondisplaced right sacral and right inferior pubic ramus fractures. No other acute fracture is identified. Healed right subtrochanteric fracture with fixation hardware in place. Electronically Signed   By: Inge Rise M.D.   On: 06/15/2016 16:02   Dg Hip Unilat  With Pelvis 2-3 Views Right  Result Date: 06/15/2016 CLINICAL DATA:  Status post fall. The patient is complaining of pain in the right lateral hip and pelvis. EXAM: DG HIP (WITH OR WITHOUT PELVIS) 2-3V RIGHT COMPARISON:  Coronal and sagittal reconstructions from an abdominal CT scan of September 29, 2015 FINDINGS: The patient has undergone interval telescoping screw and intramedullary or rod fixation for a left hip fracture. On the right a previous ORIF with telescoping screw and intramedullary rod placement for an intertrochanteric fracture has been performed. No acute re- fracture is observed. There is an old fracture of the superior aspect of the right greater trochanter which is stable. The observed portions of the right hemipelvis exhibit no acute fracture. There is diffuse osteopenia. IMPRESSION: There is no acute or significant chronic bony abnormality of the right hip. The patient has undergone previous ORIF for an intertrochanteric fracture. The metallic hardware appears intact. Electronically Signed   By: David  Martinique M.D.   On: 06/15/2016 11:22    EKG: Not found  Assessment/Plan Principal Problem:   Pelvic fracture (HCC) Active Problems:   Essential hypertension   Atrial fibrillation (HCC)   Anxiety and depression, PCP notes    Acute kidney injury  superimposed on chronic kidney disease (HCC)   CAD (coronary artery disease)   Anticoagulated on Coumadin   Anemia     Pelvic fracture - patient will be admitted for pain control Dr. Lorin Mercy contacted and will see patient tomorrow We will provide by mouth analgesia-IV and oral if needed and I'll ask PT to evaluate patient  A. Fib with controlled heart rate - on anticoagulation therapy. INR is supratherapeutic today. We will hold Coumadin and ask pharmacy to manage   Hypertension - controlled Continue to monitor and  adjust medications if needed  Acute kidney injury superimposed on CKD stage 3 Baseline creatinine is 1.3-1.4, today's creatinine is 2.33 We will give gentle hydration and recheck renal indices  Hyponatremia - mild Provide gentle hydration with normal saline and recheck next morning to level  DVT prophylaxis: Coumadin  Code Status: Full Family Communication: none Disposition Plan: MedSurg Consults called: Orthopedics by EDP Admission status: Inpatient   York Grice, [place] Pager: [phone] [facility]  If 7PM-7AM, please contact night-coverage [email or web address] Password [facility]  06/15/2016, 5:17 PM

## 2016-06-15 NOTE — ED Triage Notes (Signed)
Arrived via [place] walking at home tripped fell onto right hip.  Denies hitting head or LOC. Pain currently 1/10 achy right hip. Alert answering and following commands appropriate.

## 2016-06-15 NOTE — ED Notes (Signed)
Patient states while weight bearing and ambulating pain 3/10 achy sharp right hip pain however unable to take more then 3 steps. While on laying on stretcher denies pain.

## 2016-06-15 NOTE — ED Notes (Signed)
Patient transported to CT 

## 2016-06-15 NOTE — ED Notes (Signed)
Doctor at bedside.

## 2016-06-15 NOTE — ED Notes (Signed)
Used hospital walker to ambulate patient. Patient able to bear weight took two steps could not walk anymore due to increased pain right hip and general weakness fatigue. Alert answering and follow commands assisted patient back on stretcher. Doctor notified.

## 2016-06-16 DIAGNOSIS — D649 Anemia, unspecified: Secondary | ICD-10-CM

## 2016-06-16 DIAGNOSIS — S322XXA Fracture of coccyx, initial encounter for closed fracture: Secondary | ICD-10-CM

## 2016-06-16 DIAGNOSIS — Z7901 Long term (current) use of anticoagulants: Secondary | ICD-10-CM

## 2016-06-16 DIAGNOSIS — N183 Chronic kidney disease, stage 3 unspecified: Secondary | ICD-10-CM

## 2016-06-16 DIAGNOSIS — I2510 Atherosclerotic heart disease of native coronary artery without angina pectoris: Secondary | ICD-10-CM

## 2016-06-16 DIAGNOSIS — S3210XA Unspecified fracture of sacrum, initial encounter for closed fracture: Secondary | ICD-10-CM

## 2016-06-16 DIAGNOSIS — S329XXA Fracture of unspecified parts of lumbosacral spine and pelvis, initial encounter for closed fracture: Secondary | ICD-10-CM

## 2016-06-16 DIAGNOSIS — I2583 Coronary atherosclerosis due to lipid rich plaque: Secondary | ICD-10-CM

## 2016-06-16 DIAGNOSIS — Z5181 Encounter for therapeutic drug level monitoring: Secondary | ICD-10-CM

## 2016-06-16 DIAGNOSIS — S32810A Multiple fractures of pelvis with stable disruption of pelvic ring, initial encounter for closed fracture: Secondary | ICD-10-CM

## 2016-06-16 HISTORY — DX: Chronic kidney disease, stage 3 unspecified: N18.30

## 2016-06-16 HISTORY — DX: Fracture of unspecified parts of lumbosacral spine and pelvis, initial encounter for closed fracture: S32.9XXA

## 2016-06-16 LAB — BASIC METABOLIC PANEL
Anion gap: 13 (ref 5–15)
BUN: 35 mg/dL — AB (ref 6–20)
CHLORIDE: 94 mmol/L — AB (ref 101–111)
CO2: 29 mmol/L (ref 22–32)
CREATININE: 2.1 mg/dL — AB (ref 0.44–1.00)
Calcium: 8.7 mg/dL — ABNORMAL LOW (ref 8.9–10.3)
GFR calc Af Amer: 26 mL/min — ABNORMAL LOW (ref >60)
GFR calc non Af Amer: 22 mL/min — ABNORMAL LOW (ref >60)
Glucose, Bld: 349 mg/dL — ABNORMAL HIGH (ref 65–99)
POTASSIUM: 3.5 mmol/L (ref 3.5–5.1)
Sodium: 136 mmol/L (ref 135–145)

## 2016-06-16 LAB — URINALYSIS, ROUTINE W REFLEX MICROSCOPIC
Bilirubin Urine: NEGATIVE
Glucose, UA: >=500 mg/dL — AB
Ketones, ur: 5 mg/dL — AB
NITRITE: NEGATIVE
Protein, ur: 30 mg/dL — AB
Specific Gravity, Urine: 1.007 (ref 1.005–1.030)
pH: 6 (ref 5.0–8.0)

## 2016-06-16 LAB — CBC
HEMATOCRIT: 34.3 % — AB (ref 36.0–46.0)
Hemoglobin: 11.2 g/dL — ABNORMAL LOW (ref 12.0–15.0)
MCH: 29.2 pg (ref 26.0–34.0)
MCHC: 32.7 g/dL (ref 30.0–36.0)
MCV: 89.6 fL (ref 78.0–100.0)
PLATELETS: 213 10*3/uL (ref 150–400)
RBC: 3.83 MIL/uL — ABNORMAL LOW (ref 3.87–5.11)
RDW: 13.8 % (ref 11.5–15.5)
WBC: 11.3 10*3/uL — AB (ref 4.0–10.5)

## 2016-06-16 LAB — GLUCOSE, CAPILLARY
GLUCOSE-CAPILLARY: 308 mg/dL — AB (ref 65–99)
GLUCOSE-CAPILLARY: 287 mg/dL — AB (ref 65–99)
Glucose-Capillary: 335 mg/dL — ABNORMAL HIGH (ref 65–99)
Glucose-Capillary: 312 mg/dL — ABNORMAL HIGH (ref 65–99)
Glucose-Capillary: 357 mg/dL — ABNORMAL HIGH (ref 65–99)

## 2016-06-16 LAB — MRSA PCR SCREENING: MRSA BY PCR: NEGATIVE

## 2016-06-16 LAB — PROTIME-INR
INR: 8.03
INR: 7.11
Prothrombin Time: 69.9 seconds — ABNORMAL HIGH (ref 11.4–15.2)
Prothrombin Time: 63.4 seconds — ABNORMAL HIGH (ref 11.4–15.2)

## 2016-06-16 MED ORDER — INSULIN ASPART PROT & ASPART (70-30 MIX) 100 UNIT/ML ~~LOC~~ SUSP: 15.0000 [IU] | Freq: Two times a day (BID) | SUBCUTANEOUS | Status: DC

## 2016-06-16 MED ORDER — INSULIN ASPART PROT & ASPART (70-30 MIX) 100 UNIT/ML ~~LOC~~ SUSP
25.0000 [IU] | Freq: Every day | SUBCUTANEOUS | Status: DC
  Administered 2016-06-17 – 2016-06-18 (×2): 25 [IU] via SUBCUTANEOUS
  Filled 2016-06-16: qty 10

## 2016-06-16 MED ORDER — INSULIN ASPART PROT & ASPART (70-30 MIX) 100 UNIT/ML ~~LOC~~ SUSP
10.0000 [IU] | Freq: Every day | SUBCUTANEOUS | Status: DC
  Administered 2016-06-16 – 2016-06-17 (×2): 10 [IU] via SUBCUTANEOUS
  Filled 2016-06-16: qty 10

## 2016-06-16 MED ORDER — DEXTROSE 5 % IV SOLN
1.0000 g | INTRAVENOUS | Status: DC
  Administered 2016-06-16 – 2016-06-17 (×2): 1 g via INTRAVENOUS
  Filled 2016-06-16 (×3): qty 10

## 2016-06-16 MED ORDER — PHYTONADIONE 5 MG PO TABS
5.0000 mg | ORAL_TABLET | Freq: Once | ORAL | Status: AC
  Administered 2016-06-16: 5 mg via ORAL
  Filled 2016-06-16: qty 1

## 2016-06-16 NOTE — Consult Note (Signed)
Reason for Consult: Acute nondisplaced right sacral fracture and right inferior pubic rami fractures secondary to a fall. Referring Physician: Dr. Army Chaco is an 77 y.o. female.  HPI: 76 year old female well known to me who had previous right hip intramedullary fixation for a intertrochanteric fracture in 2014 and a left intertrochanteric fracture with intramedullary fixation by me 10-17. Patient had postoperative drainage had irrigation and debridement 01/03/2016 with satisfactory healing. Patient was at home coming from the third floor to the second floor. When she goes to the second floor to the first floor she has a prior seed goes up and down the steps. When she goes from therefore the second floor she usually sits down on the floor and goes down on her buttocks. When she is trying to get down to the floor she fell suffering the above fractures. These are closed injuries. She had immediate pain and inability to ambulate and was brought by EMS. She is comfortable supine in bed.  Past Medical History:  Diagnosis Date  . Atrial fibrillation ([place])    SVT dx 2007, cath 2007 mild CAD, then had an cardioversion, ablation; still on coumadin , has occ palpitation, EKG 03-2010 NSR  . Blindness of left eye   . DIABETES MELLITUS, TYPE II 03/27/2006   dr [name]  . Eye muscle weakness    Right eye weakness after cataract surgery  . GERD (gastroesophageal reflux disease) 10/05/2011  . 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 HYPERLIPIDEMIA 03/27/2006  . HYPERTENSION 03/27/2006  . Intertrochanteric fracture of right hip ([place]) 07/13/2012  . 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 Osteopenia 2004   Dexa 2004 showed Osteopenia, DEXA 03/2007 normal  . Osteopenia   . Other chronic cystitis with hematuria   . Recurrent urinary tract infection    Seeing Urology  . 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 Seizures ([place])   . 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[place]) 07/18/8414    Past Surgical History:  Procedure  Laterality Date  . Bilateral anke surgery Bilateral   . CARDIAC CATHETERIZATION N/A 05/17/2015   Procedure: Left Heart Cath and Coronary Angiography;  Surgeon: Jettie Booze, MD;  Location: [facility];  Service: Cardiovascular;  Laterality: N/A;  . CARDIAC CATHETERIZATION N/A 05/17/2015   Procedure: Coronary Balloon Angioplasty;  Surgeon: Jettie Booze, MD;  Location: [facility];  Service: Cardiovascular;  Laterality: N/A;  . FEMUR IM NAIL Right 07/15/2012   Procedure: INTRAMEDULLARY (IM) NAIL HIP;  Surgeon: Johnny Bridge, MD;  Location: [facility];  Service: Orthopedics;  Laterality: Right;  . FEMUR IM NAIL Left 12/02/2015   Procedure: INTRAMEDULLARY (IM) NAIL FEMORAL;  Surgeon: [name], MD;  Location: [facility];  Service: Orthopedics;  Laterality: Left;  . INCISION AND DRAINAGE HIP Left 01/03/2016   Procedure: IRRIGATION AND DEBRIDEMENT HIP;  Surgeon: [name], MD;  Location: [facility];  Service: Orthopedics;  Laterality: Left;  . left hip surgery    . SHOULDER SURGERY      Family History  Problem Relation Age of Onset  . Diabetes Father   . Heart attack Father 64  . Diabetes Sister   . Drug abuse Son   . Cancer Neg Hx     no hx of colon or breast cancer    Social History:  reports that she quit smoking about 38 years ago. Her smoking use included Cigarettes. She has never used smokeless tobacco. She reports that she does not drink alcohol  or use drugs.  Allergies:  Allergies  Allergen Reactions  . Procaine Hcl Anaphylaxis  . Amoxicillin Itching   Study Result   CLINICAL DATA:  Status post fall today with a right hip injury. The patient is unable to walk. History of prior fixation of a right hip fracture. Initial encounter.  EXAM: CT OF THE RIGHT HIP WITHOUT CONTRAST  TECHNIQUE: Multidetector CT imaging of the right hip was performed according to the standard protocol. Multiplanar CT image reconstructions were also generated.  COMPARISON:   Plain films right hip earlier today. CT abdomen and pelvis 05/20/2015.  FINDINGS: Bones/Joint/Cartilage  Intramedullary nail and hip screw are in place for fixation of a remote healed subtrochanteric fracture on the right. The patient has an acute nondisplaced right sacral fracture. Also seen is an acute right inferior pubic ramus fracture. No other acute fracture is identified. The right hip is located. Lower lumbar spondylosis is noted.  Ligaments  Suboptimally assessed by CT.  Muscles and Tendons  Negative.  Soft tissues  Extensive aortoiliac atherosclerosis is identified. No acute intrapelvic abnormality is seen.  IMPRESSION: Acute nondisplaced right sacral and right inferior pubic ramus fractures. No other acute fracture is identified.  Healed right subtrochanteric fracture with fixation hardware in place.   Electronically Signed   By: Inge Rise M.D.   On: 06/15/2016 16:02     Medications: I have reviewed the patient's current medications.  Results for orders placed or performed during the hospital encounter of 06/15/16 (from the past 48 hour(s))  CBC     Status: None   Collection Time: 06/15/16  2:53 PM  Result Value Ref Range   WBC 9.9 4.0 - 10.5 K/uL   RBC 4.09 3.87 - 5.11 MIL/uL   Hemoglobin 12.1 12.0 - 15.0 g/dL   HCT 36.6 36.0 - 46.0 %   MCV 89.5 78.0 - 100.0 fL   MCH 29.6 26.0 - 34.0 pg   MCHC 33.1 30.0 - 36.0 g/dL   RDW 13.7 11.5 - 15.5 %   Platelets 216 150 - 400 K/uL  Basic metabolic panel     Status: Abnormal   Collection Time: 06/15/16  2:53 PM  Result Value Ref Range   Sodium 133 (L) 135 - 145 mmol/L   Potassium 3.9 3.5 - 5.1 mmol/L   Chloride 93 (L) 101 - 111 mmol/L   CO2 27 22 - 32 mmol/L   Glucose, Bld 390 (H) 65 - 99 mg/dL   BUN 35 (H) 6 - 20 mg/dL   Creatinine, Ser 2.33 (H) 0.44 - 1.00 mg/dL   Calcium 8.9 8.9 - 10.3 mg/dL   GFR calc non Af Amer 19 (L) >60 mL/min   GFR calc Af Amer 23 (L) >60 mL/min     Comment: (NOTE) The eGFR has been calculated using the CKD EPI equation. This calculation has not been validated in all clinical situations. eGFR's persistently <60 mL/min signify possible Chronic Kidney Disease.    Anion gap 13 5 - 15  Protime-INR     Status: Abnormal   Collection Time: 06/15/16  2:53 PM  Result Value Ref Range   Prothrombin Time 44.6 (H) 11.4 - 15.2 seconds   INR 4.58 (HH)     Comment: REPEATED TO VERIFY CRITICAL RESULT CALLED TO, READ BACK BY AND VERIFIED WITH: B FUNK,RN 1625 06/15/16 D BRADLEY   Glucose, capillary     Status: Abnormal   Collection Time: 06/15/16  9:24 PM  Result Value Ref Range   Glucose-Capillary 335 (  H) 65 - 99 mg/dL  Urinalysis, Routine w reflex microscopic     Status: Abnormal   Collection Time: 06/16/16 12:15 AM  Result Value Ref Range   Color, Urine AMBER (A) YELLOW    Comment: BIOCHEMICALS MAY BE AFFECTED BY COLOR   APPearance CLOUDY (A) CLEAR   Specific Gravity, Urine 1.007 1.005 - 1.030   pH 6.0 5.0 - 8.0   Glucose, UA >=500 (A) NEGATIVE mg/dL   Hgb urine dipstick MODERATE (A) NEGATIVE   Bilirubin Urine NEGATIVE NEGATIVE   Ketones, ur 5 (A) NEGATIVE mg/dL   Protein, ur 30 (A) NEGATIVE mg/dL   Nitrite NEGATIVE NEGATIVE   Leukocytes, UA SMALL (A) NEGATIVE   RBC / HPF 6-30 0 - 5 RBC/hpf   WBC, UA TOO NUMEROUS TO COUNT 0 - 5 WBC/hpf   Bacteria, UA MANY (A) NONE SEEN   Squamous Epithelial / LPF 6-30 (A) NONE SEEN   WBC Clumps PRESENT    Mucous PRESENT   Basic metabolic panel     Status: Abnormal   Collection Time: 06/16/16  6:14 AM  Result Value Ref Range   Sodium 136 135 - 145 mmol/L   Potassium 3.5 3.5 - 5.1 mmol/L   Chloride 94 (L) 101 - 111 mmol/L   CO2 29 22 - 32 mmol/L   Glucose, Bld 349 (H) 65 - 99 mg/dL   BUN 35 (H) 6 - 20 mg/dL   Creatinine, Ser 2.10 (H) 0.44 - 1.00 mg/dL   Calcium 8.7 (L) 8.9 - 10.3 mg/dL   GFR calc non Af Amer 22 (L) >60 mL/min   GFR calc Af Amer 26 (L) >60 mL/min    Comment: (NOTE) The eGFR  has been calculated using the CKD EPI equation. This calculation has not been validated in all clinical situations. eGFR's persistently <60 mL/min signify possible Chronic Kidney Disease.    Anion gap 13 5 - 15  CBC     Status: Abnormal   Collection Time: 06/16/16  6:14 AM  Result Value Ref Range   WBC 11.3 (H) 4.0 - 10.5 K/uL   RBC 3.83 (L) 3.87 - 5.11 MIL/uL   Hemoglobin 11.2 (L) 12.0 - 15.0 g/dL   HCT 34.3 (L) 36.0 - 46.0 %   MCV 89.6 78.0 - 100.0 fL   MCH 29.2 26.0 - 34.0 pg   MCHC 32.7 30.0 - 36.0 g/dL   RDW 13.8 11.5 - 15.5 %   Platelets 213 150 - 400 K/uL  Glucose, capillary     Status: Abnormal   Collection Time: 06/16/16  6:54 AM  Result Value Ref Range   Glucose-Capillary 308 (H) 65 - 99 mg/dL  MRSA PCR Screening     Status: None   Collection Time: 06/16/16  8:07 AM  Result Value Ref Range   MRSA by PCR NEGATIVE NEGATIVE    Comment:        The GeneXpert MRSA Assay (FDA approved for NASAL specimens only), is one component of a comprehensive MRSA colonization surveillance program. It is not intended to diagnose MRSA infection nor to guide or monitor treatment for MRSA infections.   Glucose, capillary     Status: Abnormal   Collection Time: 06/16/16 10:42 AM  Result Value Ref Range   Glucose-Capillary 312 (H) 65 - 99 mg/dL    Ct Hip Right Wo Contrast  Result Date: 06/15/2016 CLINICAL DATA:  Status post fall today with a right hip injury. The patient is unable to walk. History of prior fixation of  a right hip fracture. Initial encounter. EXAM: CT OF THE RIGHT HIP WITHOUT CONTRAST TECHNIQUE: Multidetector CT imaging of the right hip was performed according to the standard protocol. Multiplanar CT image reconstructions were also generated. COMPARISON:  Plain films right hip earlier today. CT abdomen and pelvis 05/20/2015. FINDINGS: Bones/Joint/Cartilage Intramedullary nail and hip screw are in place for fixation of a remote healed subtrochanteric fracture on the  right. The patient has an acute nondisplaced right sacral fracture. Also seen is an acute right inferior pubic ramus fracture. No other acute fracture is identified. The right hip is located. Lower lumbar spondylosis is noted. Ligaments Suboptimally assessed by CT. Muscles and Tendons Negative. Soft tissues Extensive aortoiliac atherosclerosis is identified. No acute intrapelvic abnormality is seen. IMPRESSION: Acute nondisplaced right sacral and right inferior pubic ramus fractures. No other acute fracture is identified. Healed right subtrochanteric fracture with fixation hardware in place. Electronically Signed   By: Inge Rise M.D.   On: 06/15/2016 16:02   Dg Hip Unilat  With Pelvis 2-3 Views Right  Result Date: 06/15/2016 CLINICAL DATA:  Status post fall. The patient is complaining of pain in the right lateral hip and pelvis. EXAM: DG HIP (WITH OR WITHOUT PELVIS) 2-3V RIGHT COMPARISON:  Coronal and sagittal reconstructions from an abdominal CT scan of September 29, 2015 FINDINGS: The patient has undergone interval telescoping screw and intramedullary or rod fixation for a left hip fracture. On the right a previous ORIF with telescoping screw and intramedullary rod placement for an intertrochanteric fracture has been performed. No acute re- fracture is observed. There is an old fracture of the superior aspect of the right greater trochanter which is stable. The observed portions of the right hemipelvis exhibit no acute fracture. There is diffuse osteopenia. IMPRESSION: There is no acute or significant chronic bony abnormality of the right hip. The patient has undergone previous ORIF for an intertrochanteric fracture. The metallic hardware appears intact. Electronically Signed   By: David  Martinique M.D.   On: 06/15/2016 11:22    Review of Systems  Constitutional: Negative.  Negative for chills and fever.  HENT: Negative for ear pain and hearing loss.   Eyes:       Patient wears glasses.  Skin: Negative.     positive for non-obstructive coronary artery disease, hypertension. Positive for diabetes history of falls with bilateral hip fracture and previous left ankle fracture. Acute right hip pain after fall with pubic rami fracture and right sacral fracture. Blood pressure (!) 136/49, pulse 71, temperature 98 F (36.7 C), temperature source Oral, resp. rate 16, height '5\' 9"'$  (1.753 m), weight 179 lb (81.2 kg), SpO2 97 %. Physical Exam  Constitutional: She is oriented to person, place, and time. She appears well-developed and well-nourished.  HENT:  Head: Normocephalic and atraumatic.  Eyes: Pupils are equal, round, and reactive to light.  Neck: Normal range of motion. Neck supple.  Cardiovascular: Normal rate.   Respiratory: Effort normal and breath sounds normal. She has no wheezes.  GI: Soft. She exhibits no distension. There is no tenderness.  Musculoskeletal:  Patient has been arthritis range of motion. Tenderness over the tuberosity. Sciatic sensation and motor to the ankle is intact. Distal pulses are palpable. No venous stasis changes.  Neurological: She is alert and oriented to person, place, and time.  Skin: Skin is warm and dry.  Psychiatric: She has a normal mood and affect. Her behavior is normal. Thought content normal.    Assessment/Plan: Patient has a pelvic ring fracture  with the right sacral fracture and right rami fracture. Conservative treatment recommended, nonoperative. She could start some bed recliner transfers with weightbearing as tolerated on the right foot 2-3 days after admission. I can follow-up in the office in 2-3 weeks. Thank you for the opportunity to see her in consultation. Scan results x-ray results reviewed with the patient and outlined treatment plan was discussed. She is happy nonsurgical treatment is needed.  [name] 06/16/2016, 12:40 PM

## 2016-06-16 NOTE — Progress Notes (Signed)
ANTICOAGULATION CONSULT NOTE - Initial Consult  Pharmacy Consult for warfarin Indication: atrial fibrillation  Allergies  Allergen Reactions  . Procaine Hcl Anaphylaxis  . Amoxicillin Itching    Patient Measurements: Height: 5\' 9"  (175.3 cm) Weight: 179 lb (81.2 kg) IBW/kg (Calculated) : 66.2  Vital Signs: BP: 136/49 (04/17 0841) Pulse Rate: 71 (04/17 0841)  Labs:  Recent Labs  06/15/16 1453 06/16/16 0614 06/16/16 1226  HGB 12.1 11.2*  --   HCT 36.6 34.3*  --   PLT 216 213  --   LABPROT 44.6*  --  69.9*  INR 4.58*  --  8.03*  CREATININE 2.33* 2.10*  --     Estimated Creatinine Clearance: 26.8 mL/min (A) (by C-G formula based on SCr of 2.1 mg/dL (H)).   Medical History: Past Medical History:  Diagnosis Date  . Atrial fibrillation ([place])    SVT dx 2007, cath 2007 mild CAD, then had an cardioversion, ablation; still on coumadin , has occ palpitation, EKG 03-2010 NSR  . Blindness of left eye   . DIABETES MELLITUS, TYPE II 03/27/2006   dr [name]  . Eye muscle weakness    Right eye weakness after cataract surgery  . GERD (gastroesophageal reflux disease) 10/05/2011  . 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 HYPERLIPIDEMIA 03/27/2006  . HYPERTENSION 03/27/2006  . Intertrochanteric fracture of right hip ([place]) 07/13/2012  . 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 Osteopenia 2004   Dexa 2004 showed Osteopenia, DEXA 03/2007 normal  . Osteopenia   . Other chronic cystitis with hematuria   . Recurrent urinary tract infection    Seeing Urology  . 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 Seizures ([place])   . 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[place]) 4/58/0998    Assessment: On warfarin prior to admission for AFib Current INR 4.5 > 8 Warfarin PTA dose: 7.5 mg po on Mon/Wed/Fri, 5 mg on all other days Pt rec'd Vitamin K 5 mg po x1 today Last dose 4/15   Goal of Therapy:  INR 2-3 Monitor platelets by anticoagulation protocol: Yes    Plan:  -Hold warfarin -Daily INR   Harvel Quale 06/16/2016,2:35 PM

## 2016-06-16 NOTE — Progress Notes (Signed)
PT Cancellation Note  Patient Details Name: [name] [name] MRN: [id number] DOB: 09/17/1940   Cancelled Treatment:    Reason Eval/Treat Not Completed: Medical issues which prohibited therapy.  INR >8.  PT to hold therapy, also, from ortho MD note, "She could start some bed recliner transfers with weightbearing as tolerated on the right foot 2-3 days after admission."  So, we would not be starting to attempt bed to chair until tomorrow (06/17/16) anyway.    Thanks,    Harvie Heck 06/16/2016, 3:32 PM

## 2016-06-16 NOTE — Consult Note (Deleted)
Patient ID: [name] [name] MRN: [id number] DOB/AGE: Jun 17, 1940 76 y.o.  Admit date: 06/15/2016  Admission Diagnoses:  Principal Problem:   Pelvic fracture ([place]) Active Problems:   Essential hypertension   Atrial fibrillation ([facility])   Acute kidney injury superimposed on chronic kidney disease ([facility])   CAD (coronary artery disease)   Anticoagulated on Coumadin   Anemia   HPI: Patient being seen at the request of medicine service for right hip pain. Yesterday morning she was walking when she fell onto her right hip. Pain with weightbearing on the right side. Denies any other injuries. No loss of consciousness. CT scan showed nondisplaced right sacral and right inferior pubic ramus fracture.  Past Medical History: Past Medical History:  Diagnosis Date  . Atrial fibrillation ([place])    SVT dx 2007, cath 2007 mild CAD, then had an cardioversion, ablation; still on coumadin , has occ palpitation, EKG 03-2010 NSR  . Blindness of left eye   . DIABETES MELLITUS, TYPE II 03/27/2006   dr [name]  . Eye muscle weakness    Right eye weakness after cataract surgery  . GERD (gastroesophageal reflux disease) 10/05/2011  . 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 HYPERLIPIDEMIA 03/27/2006  . HYPERTENSION 03/27/2006  . Intertrochanteric fracture of right hip ([place]) 07/13/2012  . 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 Osteopenia 2004   Dexa 2004 showed Osteopenia, DEXA 03/2007 normal  . Osteopenia   . Other chronic cystitis with hematuria   . Recurrent urinary tract infection    Seeing Urology  . 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 Seizures ([place])   . 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[place]) 1/85/6314    Surgical History: Past Surgical History:  Procedure Laterality Date  . Bilateral anke surgery Bilateral   . CARDIAC CATHETERIZATION N/A 05/17/2015   Procedure: Left Heart Cath and Coronary Angiography;  Surgeon: Jettie Booze, MD;  Location: [facility];  Service: Cardiovascular;  Laterality:  N/A;  . CARDIAC CATHETERIZATION N/A 05/17/2015   Procedure: Coronary Balloon Angioplasty;  Surgeon: Jettie Booze, MD;  Location: [facility];  Service: Cardiovascular;  Laterality: N/A;  . FEMUR IM NAIL Right 07/15/2012   Procedure: INTRAMEDULLARY (IM) NAIL HIP;  Surgeon: Johnny Bridge, MD;  Location: [facility];  Service: Orthopedics;  Laterality: Right;  . FEMUR IM NAIL Left 12/02/2015   Procedure: INTRAMEDULLARY (IM) NAIL FEMORAL;  Surgeon: [name], MD;  Location: [facility];  Service: Orthopedics;  Laterality: Left;  . INCISION AND DRAINAGE HIP Left 01/03/2016   Procedure: IRRIGATION AND DEBRIDEMENT HIP;  Surgeon: [name], MD;  Location: [facility];  Service: Orthopedics;  Laterality: Left;  . left hip surgery    . SHOULDER SURGERY      Family History: Family History  Problem Relation Age of Onset  . Diabetes Father   . Heart attack Father 65  . Diabetes Sister   . Drug abuse Son   . Cancer Neg Hx     no hx of colon or breast cancer    Social History: Social History   Social History  . Marital status: Married    Spouse name: Jenny Reichmann  . Number of children: 1  . Years of education: College   Occupational History  . retired  Retired   Social History Main Topics  . Smoking status: Former Smoker    Types: Cigarettes    Quit date: 03/02/1978  . Smokeless tobacco: Never Used  . Alcohol use No  .  Drug use: No  . Sexual activity: No   Other Topics Concern  . Not on file   Social History Narrative   Patient lives at home spouse. Uses a walker consistently.   Moved from [place] 2004   1 son, problems w/ drugs, passed away Mar 05, 2016-- OD        Allergies: Procaine hcl and Amoxicillin  Medications: I have reviewed the patient's current medications.  Vital Signs: Patient Vitals for the past 24 hrs:  BP Temp Temp src Pulse Resp SpO2  06/16/16 0841 (!) 136/49 - - 71 - 97 %  06/16/16 0630 (!) 172/58 - - 71 16 95 %  06/15/16 2120 (!) 171/54 98 F (36.7 C) Oral 65 17 94  %  06/15/16 2030 (!) 152/67 - - 60 17 94 %  06/15/16 2000 (!) 144/58 - - (!) 58 16 92 %  06/15/16 1945 (!) 140/47 - - 60 20 94 %  06/15/16 1930 (!) 121/56 - - 60 12 91 %  06/15/16 1915 133/74 - - (!) 59 17 94 %  06/15/16 1735 125/74 - - (!) 102 - -  06/15/16 1715 124/71 - - 90 20 95 %  06/15/16 1700 116/64 - - 95 18 94 %  06/15/16 1500 120/70 - - 88 20 94 %  06/15/16 1435 129/79 - - (!) 102 18 95 %    Radiology: Ct Hip Right Wo Contrast  Result Date: 06/15/2016 CLINICAL DATA:  Status post fall today with a right hip injury. The patient is unable to walk. History of prior fixation of a right hip fracture. Initial encounter. EXAM: CT OF THE RIGHT HIP WITHOUT CONTRAST TECHNIQUE: Multidetector CT imaging of the right hip was performed according to the standard protocol. Multiplanar CT image reconstructions were also generated. COMPARISON:  Plain films right hip earlier today. CT abdomen and pelvis 05/20/2015. FINDINGS: Bones/Joint/Cartilage Intramedullary nail and hip screw are in place for fixation of a remote healed subtrochanteric fracture on the right. The patient has an acute nondisplaced right sacral fracture. Also seen is an acute right inferior pubic ramus fracture. No other acute fracture is identified. The right hip is located. Lower lumbar spondylosis is noted. Ligaments Suboptimally assessed by CT. Muscles and Tendons Negative. Soft tissues Extensive aortoiliac atherosclerosis is identified. No acute intrapelvic abnormality is seen. IMPRESSION: Acute nondisplaced right sacral and right inferior pubic ramus fractures. No other acute fracture is identified. Healed right subtrochanteric fracture with fixation hardware in place. Electronically Signed   By: Inge Rise M.D.   On: 06/15/2016 16:02   Dg Hip Unilat  With Pelvis 2-3 Views Right  Result Date: 06/15/2016 CLINICAL DATA:  Status post fall. The patient is complaining of pain in the right lateral hip and pelvis. EXAM: DG HIP (WITH  OR WITHOUT PELVIS) 2-3V RIGHT COMPARISON:  Coronal and sagittal reconstructions from an abdominal CT scan of September 29, 2015 FINDINGS: The patient has undergone interval telescoping screw and intramedullary or rod fixation for a left hip fracture. On the right a previous ORIF with telescoping screw and intramedullary rod placement for an intertrochanteric fracture has been performed. No acute re- fracture is observed. There is an old fracture of the superior aspect of the right greater trochanter which is stable. The observed portions of the right hemipelvis exhibit no acute fracture. There is diffuse osteopenia. IMPRESSION: There is no acute or significant chronic bony abnormality of the right hip. The patient has undergone previous ORIF for an intertrochanteric fracture. The metallic hardware appears  intact. Electronically Signed   By: David  Martinique M.D.   On: 06/15/2016 11:22    Labs:  Recent Labs  06/15/16 1453 06/16/16 0614  WBC 9.9 11.3*  RBC 4.09 3.83*  HCT 36.6 34.3*  PLT 216 213    Recent Labs  06/15/16 1453 06/16/16 0614  NA 133* 136  K 3.9 3.5  CL 93* 94*  CO2 27 29  BUN 35* 35*  CREATININE 2.33* 2.10*  GLUCOSE 390* 349*  CALCIUM 8.9 8.7*    Recent Labs  06/15/16 1453  INR 4.58*    Review of Systems: Review of Systems  Constitutional: Negative.   HENT: Negative.   Respiratory: Negative.   Cardiovascular: Negative.   Genitourinary: Negative.   Musculoskeletal: Positive for falls and joint pain.  Skin: Negative.   Neurological: Negative.   Psychiatric/Behavioral: Negative.     Physical Exam: Pleasant white female alert and oriented 3 and in no acute distress. Head is no strike H Mack. Extra ocular movements intact. No respiratory distress. Abdomen nondistended. Negative logroll bilateral hips. Bilateral calves nontender. Neurovascularly intact. Patient is lying comfortably in bed.  Assessment and Plan: Nondisplaced right sacral and right inferior pubic  ramus fractures. Recommending conservative treatment. Dr. Lorin Mercy has reviewed imaging studies. We'll see how she does with therapy the next couple of days. Advised patient that she may need short skilled placement due to the multiple medical issues that she has an concern with balance. All questions answered. We will continue to follow.  Alyson Locket. Ricard Dillon PA-C For Rodell Perna M.D.

## 2016-06-16 NOTE — Progress Notes (Signed)
[facility]                                                                              Patient Demographics  [name] [name], is a 76 y.o. female, DOB - 12/18/1940, MPN:[id number]  Admit date - 06/15/2016   Admitting Physician Waldemar Dickens, MD  Outpatient Primary MD for the patient is [name] November, MD  Outpatient specialists:   LOS - 1  days    Chief Complaint  Patient presents with  . Fall  . Hip Pain       Brief summary   Patient is a 76 year old female withAfib on Coumadin anticoagulation therapy, nonobstructive CAD, HTN, HLD , diabetes mellitus, GERD, with previous history of falls resulting in bilateral hip fractures and left ankle fracture presented to ED with mechanical fall. Due to persistent pain patient underwent right hip CT which showed acute right sacral and right inferior pubic rami nondisplaced fractures. Patient stated that she did not hit her head hence refused a CT of the head.    Assessment & Plan    Principal Problem:   Pelvic fracture (HCC)/Right sacral and right inferior pubic rami nondisplaced fracture - Likely nonoperative, orthopedics was consulted in ED, Dr. Lorin Mercy, recommended conservative management - PTOT evaluation - Patient states that she will not go to skilled nursing facility however prefers inpatient rehabilitation - Continue pain control  Active Problems:  A. Fib  - on anticoagulation therapy. - Heart rate controlled - INR is supratherapeutic on admission, recheck PT/INR today, Coumadin was held  - Coumadin dosing per pharmacy     essential Hypertension - controlled - Currently stable, continue Toprol-XL  Acute kidney injury superimposed on CKD stage 3 - Baseline creatinine is 1.3-1.4, admitted with creatinine function of 2.3 - Hold Lasix, continue gentle hydration   Hyponatremia - mild - Improving, continue gentle hydration  UTI urinary tract infection - Follow urine culture and sensitivities, start on IV  Rocephin   Diabetes mellitus: Uncontrolled with hyperglycemia type 2 insulin-dependent -  restart home dose of NovoLog 70/30 and adjust according to CBG readings - Continue sliding scale insulin - Follow hemoglobin A1c  Code Status: Full CODE STATUS DVT Prophylaxis:Supratherapeutic INR  Family Communication: Discussed in detail with the patient, all imaging results, lab results explained to the patient   Disposition Plan:  Time Spent in minutes   25 minutes   Procedures:  *CT of the right hip   Consultants:   Orthopedics   Antimicrobials:   IV Rocephin 4/17>   Medications  Scheduled Meds: . atorvastatin  80 mg Oral QHS  . calcium-vitamin D  1 tablet Oral Daily  . clopidogrel  75 mg Oral Daily  . coumadin book   Does not apply Once  . feeding supplement (PRO-STAT SUGAR FREE 64)  30 mL Oral BID  . fentaNYL (SUBLIMAZE) injection  50 mcg Intravenous Once  . furosemide  40 mg Oral BID  . insulin aspart  0-5 Units Subcutaneous QHS  . insulin aspart  0-9 Units Subcutaneous TID WC  . metoprolol succinate  25 mg Oral Daily  . pantoprazole  40 mg Oral Daily  .  protein supplement  30 mL Oral BID  . sodium chloride flush  3 mL Intravenous Q12H   Continuous Infusions: . sodium chloride 75 mL/hr at 06/15/16 2209   PRN Meds:.HYDROcodone-acetaminophen, HYDROmorphone (DILAUDID) injection, ondansetron **OR** ondansetron (ZOFRAN) IV, polyethylene glycol, traZODone   Antibiotics   Anti-infectives    None        Subjective:   [name] was seen and examined today. Complaining of pain in the pelvis, 5 out of 10, only on moving.  Patient denies dizziness, chest pain, shortness of breath, abdominal pain, N/V/D/C, new weakness, numbess, tingling. No acute events overnight.    Objective:   Vitals:   06/15/16 2030 06/15/16 2120 06/16/16 0630 06/16/16 0841  BP: (!) 152/67 (!) 171/54 (!) 172/58 (!) 136/49  Pulse: 60 65 71 71  Resp: 17 17 16    Temp:  98 F (36.7 C)      TempSrc:  Oral    SpO2: 94% 94% 95% 97%  Weight:      Height:        Intake/Output Summary (Last 24 hours) at 06/16/16 1134 Last data filed at 06/16/16 0841  Gross per 24 hour  Intake              790 ml  Output                0 ml  Net              790 ml     Wt Readings from Last 3 Encounters:  06/15/16 81.2 kg (179 lb)  05/28/16 88.7 kg (195 lb 8 oz)  05/26/16 80.7 kg (178 lb)     Exam  General: Alert and oriented x 3, NAD  HEENT:    Neck: Supple, no JVD  Cardiovascular: S1 S2 auscultated, no rubs, murmurs or gallops. Regular rate and rhythm.  Respiratory: Clear to auscultation bilaterally, no wheezing, rales or rhonchi  Gastrointestinal: Soft, nontender, nondistended, + bowel sounds  Ext: no cyanosis clubbing or edema  Neuro: AAOx3, Cr N's II- XII. Strength 5/5 upper and lower extremities bilaterally  Skin: No rashes  Psych: Normal affect and demeanor, alert and oriented x3    Data Reviewed:  I have personally reviewed following labs and imaging studies  Micro Results Recent Results (from the past 240 hour(s))  MRSA PCR Screening     Status: None   Collection Time: 06/16/16  8:07 AM  Result Value Ref Range Status   MRSA by PCR NEGATIVE NEGATIVE Final    Comment:        The GeneXpert MRSA Assay (FDA approved for NASAL specimens only), is one component of a comprehensive MRSA colonization surveillance program. It is not intended to diagnose MRSA infection nor to guide or monitor treatment for MRSA infections.     Radiology Reports Ct Hip Right Wo Contrast  Result Date: 06/15/2016 CLINICAL DATA:  Status post fall today with a right hip injury. The patient is unable to walk. History of prior fixation of a right hip fracture. Initial encounter. EXAM: CT OF THE RIGHT HIP WITHOUT CONTRAST TECHNIQUE: Multidetector CT imaging of the right hip was performed according to the standard protocol. Multiplanar CT image reconstructions were also generated.  COMPARISON:  Plain films right hip earlier today. CT abdomen and pelvis 05/20/2015. FINDINGS: Bones/Joint/Cartilage Intramedullary nail and hip screw are in place for fixation of a remote healed subtrochanteric fracture on the right. The patient has an acute nondisplaced right sacral fracture. Also seen is an  acute right inferior pubic ramus fracture. No other acute fracture is identified. The right hip is located. Lower lumbar spondylosis is noted. Ligaments Suboptimally assessed by CT. Muscles and Tendons Negative. Soft tissues Extensive aortoiliac atherosclerosis is identified. No acute intrapelvic abnormality is seen. IMPRESSION: Acute nondisplaced right sacral and right inferior pubic ramus fractures. No other acute fracture is identified. Healed right subtrochanteric fracture with fixation hardware in place. Electronically Signed   By: Inge Rise M.D.   On: 06/15/2016 16:02   Dg Hip Unilat  With Pelvis 2-3 Views Right  Result Date: 06/15/2016 CLINICAL DATA:  Status post fall. The patient is complaining of pain in the right lateral hip and pelvis. EXAM: DG HIP (WITH OR WITHOUT PELVIS) 2-3V RIGHT COMPARISON:  Coronal and sagittal reconstructions from an abdominal CT scan of September 29, 2015 FINDINGS: The patient has undergone interval telescoping screw and intramedullary or rod fixation for a left hip fracture. On the right a previous ORIF with telescoping screw and intramedullary rod placement for an intertrochanteric fracture has been performed. No acute re- fracture is observed. There is an old fracture of the superior aspect of the right greater trochanter which is stable. The observed portions of the right hemipelvis exhibit no acute fracture. There is diffuse osteopenia. IMPRESSION: There is no acute or significant chronic bony abnormality of the right hip. The patient has undergone previous ORIF for an intertrochanteric fracture. The metallic hardware appears intact. Electronically Signed   By:  David  Martinique M.D.   On: 06/15/2016 11:22    Lab Data:  CBC:  Recent Labs Lab 06/15/16 1453 06/16/16 0614  WBC 9.9 11.3*  HGB 12.1 11.2*  HCT 36.6 34.3*  MCV 89.5 89.6  PLT 216 951   Basic Metabolic Panel:  Recent Labs Lab 06/15/16 1453 06/16/16 0614  NA 133* 136  K 3.9 3.5  CL 93* 94*  CO2 27 29  GLUCOSE 390* 349*  BUN 35* 35*  CREATININE 2.33* 2.10*  CALCIUM 8.9 8.7*   GFR: Estimated Creatinine Clearance: 26.8 mL/min (A) (by C-G formula based on SCr of 2.1 mg/dL (H)). Liver Function Tests: No results for input(s): AST, ALT, ALKPHOS, BILITOT, PROT, ALBUMIN in the last 168 hours. No results for input(s): LIPASE, AMYLASE in the last 168 hours. No results for input(s): AMMONIA in the last 168 hours. Coagulation Profile:  Recent Labs Lab 06/15/16 1453  INR 4.58*   Cardiac Enzymes: No results for input(s): CKTOTAL, CKMB, CKMBINDEX, TROPONINI in the last 168 hours. BNP (last 3 results) No results for input(s): PROBNP in the last 8760 hours. HbA1C: No results for input(s): HGBA1C in the last 72 hours. CBG:  Recent Labs Lab 06/15/16 2124 06/16/16 0654 06/16/16 1042  GLUCAP 335* 308* 312*   Lipid Profile: No results for input(s): CHOL, HDL, LDLCALC, TRIG, CHOLHDL, LDLDIRECT in the last 72 hours. Thyroid Function Tests: No results for input(s): TSH, T4TOTAL, FREET4, T3FREE, THYROIDAB in the last 72 hours. Anemia Panel: No results for input(s): VITAMINB12, FOLATE, FERRITIN, TIBC, IRON, RETICCTPCT in the last 72 hours. Urine analysis:    Component Value Date/Time   COLORURINE AMBER (A) 06/16/2016 0015   APPEARANCEUR CLOUDY (A) 06/16/2016 0015   LABSPEC 1.007 06/16/2016 0015   PHURINE 6.0 06/16/2016 0015   GLUCOSEU >=500 (A) 06/16/2016 0015   GLUCOSEU 100 (A) 08/07/2015 0948   HGBUR MODERATE (A) 06/16/2016 0015   BILIRUBINUR NEGATIVE 06/16/2016 0015   BILIRUBINUR neg 05/16/2015 1928   KETONESUR 5 (A) 06/16/2016 0015   PROTEINUR 30 (  A) 06/16/2016 0015    UROBILINOGEN 0.2 08/07/2015 0948   NITRITE NEGATIVE 06/16/2016 0015   LEUKOCYTESUR SMALL (A) 06/16/2016 0015     Ripudeep Rai M.D. [facility] 06/16/2016, 11:34 AM  Pager: [phone] Between 7am to 7pm - call Pager - [phone]  After 7pm go to [email or web address] - password [email or web address]  Call night coverage person covering after 7pm

## 2016-06-16 NOTE — Progress Notes (Signed)
CRITICAL VALUE ALERT  Critical value received: INR 8.03  Date of notification:  06/16/16  Time of notification:  8867  Critical value read back:Yes.    Nurse who received alert:  Shayne Alken RN  MD notified (1st page):  Dr Tana Coast  Time of first page:  1330   MD notified (2nd page):  Time of second page:  Responding MD:  Dr.Rai  Time MD responded:  1331  Clee Pandit, Tivis Ringer, RN

## 2016-06-17 ENCOUNTER — Encounter (HOSPITAL_COMMUNITY): Payer: Self-pay | Admitting: General Practice

## 2016-06-17 LAB — BASIC METABOLIC PANEL
Anion gap: 12 (ref 5–15)
BUN: 25 mg/dL — ABNORMAL HIGH (ref 6–20)
CHLORIDE: 96 mmol/L — AB (ref 101–111)
CO2: 28 mmol/L (ref 22–32)
CREATININE: 1.68 mg/dL — AB (ref 0.44–1.00)
Calcium: 8.6 mg/dL — ABNORMAL LOW (ref 8.9–10.3)
GFR calc Af Amer: 33 mL/min — ABNORMAL LOW (ref >60)
GFR calc non Af Amer: 29 mL/min — ABNORMAL LOW (ref >60)
GLUCOSE: 242 mg/dL — AB (ref 65–99)
POTASSIUM: 3.3 mmol/L — AB (ref 3.5–5.1)
SODIUM: 136 mmol/L (ref 135–145)

## 2016-06-17 LAB — GLUCOSE, CAPILLARY
GLUCOSE-CAPILLARY: 251 mg/dL — AB (ref 65–99)
GLUCOSE-CAPILLARY: 177 mg/dL — AB (ref 65–99)
GLUCOSE-CAPILLARY: 159 mg/dL — AB (ref 65–99)
Glucose-Capillary: 167 mg/dL — ABNORMAL HIGH (ref 65–99)

## 2016-06-17 LAB — CBC
HEMATOCRIT: 32.8 % — AB (ref 36.0–46.0)
HEMOGLOBIN: 10.8 g/dL — AB (ref 12.0–15.0)
MCH: 29 pg (ref 26.0–34.0)
MCHC: 32.9 g/dL (ref 30.0–36.0)
MCV: 88.2 fL (ref 78.0–100.0)
Platelets: 215 10*3/uL (ref 150–400)
RBC: 3.72 MIL/uL — AB (ref 3.87–5.11)
RDW: 13.4 % (ref 11.5–15.5)
WBC: 8.5 10*3/uL (ref 4.0–10.5)

## 2016-06-17 LAB — HEMOGLOBIN A1C
Hgb A1c MFr Bld: 8.6 % — ABNORMAL HIGH (ref 4.8–5.6)
MEAN PLASMA GLUCOSE: 200 mg/dL

## 2016-06-17 LAB — PROTIME-INR
INR: 2.92
PROTHROMBIN TIME: 31.1 s — AB (ref 11.4–15.2)

## 2016-06-17 MED ORDER — ZOLPIDEM TARTRATE 5 MG PO TABS
5.0000 mg | ORAL_TABLET | Freq: Every evening | ORAL | Status: DC | PRN
  Administered 2016-06-17: 5 mg via ORAL
  Filled 2016-06-17: qty 1

## 2016-06-17 MED ORDER — ENSURE ENLIVE PO LIQD
237.0000 mL | Freq: Two times a day (BID) | ORAL | Status: DC
  Administered 2016-06-18: 237 mL via ORAL

## 2016-06-17 MED ORDER — WARFARIN SODIUM 5 MG PO TABS
2.5000 mg | ORAL_TABLET | Freq: Once | ORAL | Status: AC
  Administered 2016-06-17: 2.5 mg via ORAL
  Filled 2016-06-17: qty 1

## 2016-06-17 MED ORDER — POTASSIUM CHLORIDE CRYS ER 20 MEQ PO TBCR
40.0000 meq | EXTENDED_RELEASE_TABLET | Freq: Four times a day (QID) | ORAL | Status: AC
  Administered 2016-06-17 (×2): 40 meq via ORAL
  Filled 2016-06-17 (×2): qty 2

## 2016-06-17 MED ORDER — VITAMIN K1 10 MG/ML IJ SOLN
1.0000 mg | Freq: Once | INTRAVENOUS | Status: DC
  Filled 2016-06-17: qty 0.1

## 2016-06-17 MED ORDER — WARFARIN - PHARMACIST DOSING INPATIENT: Freq: Every day | Status: DC

## 2016-06-17 NOTE — Discharge Instructions (Addendum)
Follow with Primary MD [name], MD in 4 days   Get CBC, CMP, INR, 2 view Chest X ray checked  by Primary MD or SNF MD in 4 days ( we routinely change or add medications that can affect your baseline labs and fluid status, therefore we recommend that you get the mentioned basic workup next visit with your PCP, your PCP may decide not to get them or add new tests based on their clinical decision)  Activity: As tolerated with Full fall precautions use walker/cane & assistance as needed  Disposition Home    Diet:  Heart Healthy Low Carb.    Information on my medicine - Coumadin   (Warfarin)  This medication education was reviewed with me or my healthcare representative as part of my discharge preparation.  The pharmacist that spoke with me during my hospital stay was:  Saundra Shelling, [facility]  Why was Coumadin prescribed for you? Coumadin was prescribed for you because you have a blood clot or a medical condition that can cause an increased risk of forming blood clots. Blood clots can cause serious health problems by blocking the flow of blood to the heart, lung, or brain. Coumadin can prevent harmful blood clots from forming. As a reminder your indication for Coumadin is:   Stroke Prevention Because Of Atrial Fibrillation  What test will check on my response to Coumadin? While on Coumadin (warfarin) you will need to have an INR test regularly to ensure that your dose is keeping you in the desired range. The INR (international normalized ratio) number is calculated from the result of the laboratory test called prothrombin time (PT).  If an INR APPOINTMENT HAS NOT ALREADY BEEN MADE FOR YOU please schedule an appointment to have this lab work done by your health care provider within 7 days. Your INR goal is usually a number between:  2 to 3 or your provider may give you a more narrow range like 2-2.5.  Ask your health care provider during an office visit what your goal INR is.  What  do you need  to  know  About  COUMADIN? Take Coumadin (warfarin) exactly as prescribed by your healthcare provider about the same time each day.  DO NOT stop taking without talking to the doctor who prescribed the medication.  Stopping without other blood clot prevention medication to take the place of Coumadin may increase your risk of developing a new clot or stroke.  Get refills before you run out.  What do you do if you miss a dose? If you miss a dose, take it as soon as you remember on the same day then continue your regularly scheduled regimen the next day.  Do not take two doses of Coumadin at the same time.  Important Safety Information A possible side effect of Coumadin (Warfarin) is an increased risk of bleeding. You should call your healthcare provider right away if you experience any of the following: ? Bleeding from an injury or your nose that does not stop. ? Unusual colored urine (red or dark brown) or unusual colored stools (red or black). ? Unusual bruising for unknown reasons. ? A serious fall or if you hit your head (even if there is no bleeding).  Some foods or medicines interact with Coumadin (warfarin) and might alter your response to warfarin. To help avoid this: ? Eat a balanced diet, maintaining a consistent amount of Vitamin K. ? Notify your provider about major diet changes you plan to make. ?  Avoid alcohol or limit your intake to 1 drink for women and 2 drinks for men per day. (1 drink is 5 oz. wine, 12 oz. beer, or 1.5 oz. liquor.)  Make sure that ANY health care provider who prescribes medication for you knows that you are taking Coumadin (warfarin).  Also make sure the healthcare provider who is monitoring your Coumadin knows when you have started a new medication including herbals and non-prescription products.  Coumadin (Warfarin)  Major Drug Interactions  Increased Warfarin Effect Decreased Warfarin Effect  Alcohol (large quantities) Antibiotics (esp. Septra/Bactrim,  Flagyl, Cipro) Amiodarone (Cordarone) Aspirin (ASA) Cimetidine (Tagamet) Megestrol (Megace) NSAIDs (ibuprofen, naproxen, etc.) Piroxicam (Feldene) Propafenone (Rythmol SR) Propranolol (Inderal) Isoniazid (INH) Posaconazole (Noxafil) Barbiturates (Phenobarbital) Carbamazepine (Tegretol) Chlordiazepoxide (Librium) Cholestyramine (Questran) Griseofulvin Oral Contraceptives Rifampin Sucralfate (Carafate) Vitamin K   Coumadin (Warfarin) Major Herbal Interactions  Increased Warfarin Effect Decreased Warfarin Effect  Garlic Ginseng Ginkgo biloba Coenzyme Q10 Green tea St. Johns wort    Coumadin (Warfarin) FOOD Interactions  Eat a consistent number of servings per week of foods HIGH in Vitamin K (1 serving =  cup)  Collards (cooked, or boiled & drained) Kale (cooked, or boiled & drained) Mustard greens (cooked, or boiled & drained) Parsley *serving size only =  cup Spinach (cooked, or boiled & drained) Swiss chard (cooked, or boiled & drained) Turnip greens (cooked, or boiled & drained)  Eat a consistent number of servings per week of foods MEDIUM-HIGH in Vitamin K (1 serving = 1 cup)  Asparagus (cooked, or boiled & drained) Broccoli (cooked, boiled & drained, or raw & chopped) Brussel sprouts (cooked, or boiled & drained) *serving size only =  cup Lettuce, raw (green leaf, endive, romaine) Spinach, raw Turnip greens, raw & chopped   These websites have more information on Coumadin (warfarin):  FailFactory.se; VeganReport.com.au;     Accuchecks 4 times/day, Once in AM empty stomach and then before each meal. Log in all results and show them to your Prim.MD in 3 days. If any glucose reading is under 80 or above 300 call your Prim MD immidiately. Follow Low glucose instructions for glucose under 80 as instructed.   For Heart failure patients - Check your Weight same time everyday, if you gain over 2 pounds, or you develop in leg swelling,  experience more shortness of breath or chest pain, call your Primary MD immediately. Follow Cardiac Low Salt Diet and 1.5 lit/day fluid restriction.  On your next visit with your primary care physician please Get Medicines reviewed and adjusted.  Please request your Prim.MD to go over all Hospital Tests and Procedure/Radiological results at the follow up, please get all Hospital records sent to your Prim MD by signing hospital release before you go home.  If you experience worsening of your admission symptoms, develop shortness of breath, life threatening emergency, suicidal or homicidal thoughts you must seek medical attention immediately by calling 911 or calling your MD immediately  if symptoms less severe.  You Must read complete instructions/literature along with all the possible adverse reactions/side effects for all the Medicines you take and that have been prescribed to you. Take any new Medicines after you have completely understood and accpet all the possible adverse reactions/side effects.   Do not drive, operate heavy machinery, perform activities at heights, swimming or participation in water activities or provide baby sitting services if your were admitted for syncope or siezures until you have seen by Primary MD or a Neurologist and advised to do so again.  Do not drive when taking Pain medications.    Do not take more than prescribed Pain, Sleep and Anxiety Medications  Special Instructions: If you have smoked or chewed Tobacco  in the last 2 yrs please stop smoking, stop any regular Alcohol  and or any Recreational drug use.  Wear Seat belts while driving.   Please note  You were cared for by a hospitalist during your hospital stay. If you have any questions about your discharge medications or the care you received while you were in the hospital after you are discharged, you can call the unit and asked to speak with the hospitalist on call if the hospitalist that took care of  you is not available. Once you are discharged, your primary care physician will handle any further medical issues. Please note that NO REFILLS for any discharge medications will be authorized once you are discharged, as it is imperative that you return to your primary care physician (or establish a relationship with a primary care physician if you do not have one) for your aftercare needs so that they can reassess your need for medications and monitor your lab values.   Information on my medicine - Coumadin   (Warfarin)  Why was Coumadin prescribed for you? Coumadin was prescribed for you because you have a blood clot or a medical condition that can cause an increased risk of forming blood clots. Blood clots can cause serious health problems by blocking the flow of blood to the heart, lung, or brain. Coumadin can prevent harmful blood clots from forming. As a reminder your indication for Coumadin is:   Stroke Prevention Because Of Atrial Fibrillation  What test will check on my response to Coumadin? While on Coumadin (warfarin) you will need to have an INR test regularly to ensure that your dose is keeping you in the desired range. The INR (international normalized ratio) number is calculated from the result of the laboratory test called prothrombin time (PT).  If an INR APPOINTMENT HAS NOT ALREADY BEEN MADE FOR YOU please schedule an appointment to have this lab work done by your health care provider within 7 days. Your INR goal is usually a number between:  2 to 3 or your provider may give you a more narrow range like 2-2.5.  Ask your health care provider during an office visit what your goal INR is.  What  do you need to  know  About  COUMADIN? Take Coumadin (warfarin) exactly as prescribed by your healthcare provider about the same time each day.  DO NOT stop taking without talking to the doctor who prescribed the medication.  Stopping without other blood clot prevention medication to take the  place of Coumadin may increase your risk of developing a new clot or stroke.  Get refills before you run out.  What do you do if you miss a dose? If you miss a dose, take it as soon as you remember on the same day then continue your regularly scheduled regimen the next day.  Do not take two doses of Coumadin at the same time.  Important Safety Information A possible side effect of Coumadin (Warfarin) is an increased risk of bleeding. You should call your healthcare provider right away if you experience any of the following: ? Bleeding from an injury or your nose that does not stop. ? Unusual colored urine (red or dark brown) or unusual colored stools (red or black). ? Unusual bruising for unknown reasons. ? A serious fall or  if you hit your head (even if there is no bleeding).  Some foods or medicines interact with Coumadin (warfarin) and might alter your response to warfarin. To help avoid this: ? Eat a balanced diet, maintaining a consistent amount of Vitamin K. ? Notify your provider about major diet changes you plan to make. ? Avoid alcohol or limit your intake to 1 drink for women and 2 drinks for men per day. (1 drink is 5 oz. wine, 12 oz. beer, or 1.5 oz. liquor.)  Make sure that ANY health care provider who prescribes medication for you knows that you are taking Coumadin (warfarin).  Also make sure the healthcare provider who is monitoring your Coumadin knows when you have started a new medication including herbals and non-prescription products.  Coumadin (Warfarin)  Major Drug Interactions  Increased Warfarin Effect Decreased Warfarin Effect  Alcohol (large quantities) Antibiotics (esp. Septra/Bactrim, Flagyl, Cipro) Amiodarone (Cordarone) Aspirin (ASA) Cimetidine (Tagamet) Megestrol (Megace) NSAIDs (ibuprofen, naproxen, etc.) Piroxicam (Feldene) Propafenone (Rythmol SR) Propranolol (Inderal) Isoniazid (INH) Posaconazole (Noxafil) Barbiturates  (Phenobarbital) Carbamazepine (Tegretol) Chlordiazepoxide (Librium) Cholestyramine (Questran) Griseofulvin Oral Contraceptives Rifampin Sucralfate (Carafate) Vitamin K   Coumadin (Warfarin) Major Herbal Interactions  Increased Warfarin Effect Decreased Warfarin Effect  Garlic Ginseng Ginkgo biloba Coenzyme Q10 Green tea St. Johns wort    Coumadin (Warfarin) FOOD Interactions  Eat a consistent number of servings per week of foods HIGH in Vitamin K (1 serving =  cup)  Collards (cooked, or boiled & drained) Kale (cooked, or boiled & drained) Mustard greens (cooked, or boiled & drained) Parsley *serving size only =  cup Spinach (cooked, or boiled & drained) Swiss chard (cooked, or boiled & drained) Turnip greens (cooked, or boiled & drained)  Eat a consistent number of servings per week of foods MEDIUM-HIGH in Vitamin K (1 serving = 1 cup)  Asparagus (cooked, or boiled & drained) Broccoli (cooked, boiled & drained, or raw & chopped) Brussel sprouts (cooked, or boiled & drained) *serving size only =  cup Lettuce, raw (green leaf, endive, romaine) Spinach, raw Turnip greens, raw & chopped   These websites have more information on Coumadin (warfarin):  FailFactory.se; VeganReport.com.au;

## 2016-06-17 NOTE — Progress Notes (Signed)
Pt's on daily INR monitoring; received call from lab INR-7.11 which gone  down from 8.03 earlier at noontime ;page/txt doc on call; no orders

## 2016-06-17 NOTE — Evaluation (Signed)
Physical Therapy Evaluation Patient Details Name: [name] [name] MRN: [id number] DOB: January 14, 1941 Today's Date: 06/17/2016   History of Present Illness  76 year old female well known to me who had previous right hip intramedullary fixation for a intertrochanteric fracture in 2014 and a left intertrochanteric fracture with intramedullary fixation by me 10-17. Patient had postoperative drainage had irrigation and debridement 01/03/2016 with satisfactory healing. Patient was at home coming from the third floor to the second floor. When she goes to the second floor to the first floor she has a prior seed goes up and down the steps. When she goes from therefore the second floor she usually sits down on the floor and goes down on her buttocks. When she is trying to get down to the floor she fell suffering the above fractures. PMH:  Afib, DM, HTN, CHF, blindness one eye, seizures  Clinical Impression  Pt admitted with above diagnosis. Pt currently with functional limitations due to the deficits listed below (see PT Problem List). Pt was able to transfer to chair with min assist with RW.  Pt needed assist to technique as she is slightly unsteady.  Discussed d/c with pt and husband as pt is adamant she will not go to SNF for therapy.  Pt states she will stay on 1st level of home and function if she has the equipment and Strong below.  1st level has a 1/2 bath but husband states that wheelchair would probably not fit.  Discussed pt would sponge bathe and use 3N1.    Husband agrees with home  halfheartedly.  Will continue acute PT.   Pt will benefit from skilled PT to increase their independence and safety with mobility to allow discharge to the venue listed below.      Follow Up Recommendations Home health PT;Supervision/Assistance - 24 hour (HHOT, HHAide)    Equipment Recommendations  Wheelchair (18x16 lightweight);Wheelchair cushion 18x 16 pressure relieving);Hospital bed;3in1 (PT);Rolling walker with 5" wheels  (18x16 lightweight w/c, antitippers,desk armrests, footrests)           Precautions / Restrictions Precautions Precautions: Fall Restrictions Weight Bearing Restrictions: Yes RLE Weight Bearing: Weight bearing as tolerated      Mobility  Bed Mobility Overal bed mobility: Needs Assistance Bed Mobility: Supine to Sit     Supine to sit: Min guard     General bed mobility comments: No assist needed to come to EOB.  Incr time.  Transfers Overall transfer level: Needs assistance Equipment used: Rolling walker (2 wheeled) Transfers: Sit to/from [facility] Sit to Stand: Min assist Stand pivot transfers: Min assist       General transfer comment: Pt needed min assist to power up.  Pt needed min assist to stand pivot to recliner with use of RW.  Cues and assist to stand tall as she tended to flex forward and weight was posterior. Able to step around with cues.  Assist to control descent into chair.     Balance Overall balance assessment: Needs assistance;History of Falls Sitting-balance support: No upper extremity supported;Feet supported Sitting balance-Leahy Scale: Fair     Standing balance support: Bilateral upper extremity supported;During functional activity Standing balance-Leahy Scale: Poor Standing balance comment: relies on UEs for support with RW. Not standing fully upright with flexed posture.                              Pertinent Vitals/Pain Pain Assessment: Faces Faces Pain Scale: Hurts a little  bit Pain Location: right pelvis Pain Descriptors / Indicators: Aching;Grimacing;Guarding Pain Intervention(s): Limited activity within patient's tolerance;Monitored during session;Premedicated before session;Repositioned  HR up to 148 bpm with activity from 110 bpm.  92% on RA.    Home Living Family/patient expects to be discharged to:: Private residence Living Arrangements: Spouse/significant other Available Help at Discharge:  Family;Available 24 hours/day Type of Home: House Home Access: Stairs to enter Entrance Stairs-Rails: Left Entrance Stairs-Number of Steps: 1 Home Layout: Multi-level;1/2 bath on main level Home Equipment: Cane - single point;Grab bars - tub/shower;Grab bars - toilet;Walker - 4 wheels;Bedside commode Additional Comments: Does not want to go to SNF. Husband worried about taking pt home but agrees he may have to if she refuses SNF.  first level of home has TV room and 1/2 bath with stair lift to 2nd level.  2nd level is living area and kitchen.  3rd level 10 steps to it and it has bedroom and full bath.    Prior Function Level of Independence: Independent with assistive device(s);Needs assistance   Gait / Transfers Assistance Needed: Only ambulates short distances and working with [place].  ADL's / Homemaking Assistance Needed: Husband performs homemaking tasks. I with ADLS.  Comments: blind left eye therefore does not drive     Hand Dominance   Dominant Hand: Right    Extremity/Trunk Assessment   Upper Extremity Assessment Upper Extremity Assessment: Defer to OT evaluation    Lower Extremity Assessment Lower Extremity Assessment: RLE deficits/detail RLE Deficits / Details: grossly 3/5    Cervical / Trunk Assessment Cervical / Trunk Assessment: Normal  Communication   Communication: No difficulties  Cognition Arousal/Alertness: Awake/alert Behavior During Therapy: WFL for tasks assessed/performed Overall Cognitive Status: Impaired/Different from baseline Area of Impairment: Safety/judgement;Problem solving                         Safety/Judgement: Decreased awareness of safety   Problem Solving: Difficulty sequencing;Requires verbal cues;Slow processing              Exercises General Exercises - Lower Extremity Ankle Circles/Pumps: AROM;Both;10 reps;Supine Long Arc Quad: AROM;Both;10 reps;Seated Heel Slides: AAROM;Both;10 reps;Supine       PT  Assessment Patient needs continued PT services  PT Problem List Decreased strength;Decreased activity tolerance;Decreased balance;Decreased mobility;Decreased range of motion;Decreased knowledge of use of DME;Decreased safety awareness;Decreased knowledge of precautions;Pain       PT Treatment Interventions DME instruction;Gait training;Stair training;Functional mobility training;Therapeutic activities;Therapeutic exercise;Balance training;Patient/family education    PT Goals (Current goals can be found in the Care Plan section)  Acute Rehab PT Goals Patient Stated Goal: to go home PT Goal Formulation: With patient Time For Goal Achievement: 07/01/16 Potential to Achieve Goals: Good    Frequency Min 5X/week   Barriers to discharge Decreased caregiver support (husband states he can't assist with some things)                     End of Session Equipment Utilized During Treatment: Gait belt Activity Tolerance: Patient limited by fatigue Patient left: in chair;with call bell/phone within reach;with chair alarm set;with family/visitor present Nurse Communication: Mobility status PT Visit Diagnosis: Unsteadiness on feet (R26.81);Muscle weakness (generalized) (M62.81);Pain Pain - Right/Left: Right Pain - part of body: Leg (pelvis)    Time: 4098-1191 PT Time Calculation (min) (ACUTE ONLY): 25 min   Charges:   PT Evaluation $PT Eval Moderate Complexity: 1 Procedure PT Treatments $Therapeutic Activity: 8-22 mins   PT G Codes:  [facility] Acute Rehabilitation [phone] (pager)   Denice Paradise 06/17/2016, 12:58 PM

## 2016-06-17 NOTE — Progress Notes (Addendum)
[name] [name] for warfarin Indication: atrial fibrillation  Allergies  Allergen Reactions  . Procaine Hcl Anaphylaxis  . Amoxicillin Itching    Patient Measurements: Height: 5\' 9"  (175.3 cm) Weight: 179 lb (81.2 kg) IBW/kg (Calculated) : 66.2  Vital Signs: Temp: 98.3 F (36.8 C) (04/18 0451) Temp Source: Oral (04/18 0451) BP: 155/61 (04/18 0451) Pulse Rate: 71 (04/18 0451)  Labs:  Recent Labs  06/15/16 1453 06/16/16 0614 06/16/16 1226 06/16/16 1824 06/17/16 0526  HGB 12.1 11.2*  --   --  10.8*  HCT 36.6 34.3*  --   --  32.8*  PLT 216 213  --   --  215  LABPROT 44.6*  --  69.9* 63.4* 31.1*  INR 4.58*  --  8.03* 7.11* 2.92  CREATININE 2.33* 2.10*  --   --  1.68*    Estimated Creatinine Clearance: 33.5 mL/min (A) (by C-G formula based on SCr of 1.68 mg/dL (H)).   Medical History: Past Medical History:  Diagnosis Date  . Atrial fibrillation ([place])    SVT dx 2007, cath 2007 mild CAD, then had an cardioversion, ablation; still on coumadin , has occ palpitation, EKG 03-2010 NSR  . Blindness of left eye   . DIABETES MELLITUS, TYPE II 03/27/2006   dr [name]  . Eye muscle weakness    Right eye weakness after cataract surgery  . GERD (gastroesophageal reflux disease) 10/05/2011  . 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 HYPERLIPIDEMIA 03/27/2006  . HYPERTENSION 03/27/2006  . Intertrochanteric fracture of right hip ([place]) 07/13/2012  . 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 Osteopenia 2004   Dexa 2004 showed Osteopenia, DEXA 03/2007 normal  . Osteopenia   . Other chronic cystitis with hematuria   . Recurrent urinary tract infection    Seeing Urology  . 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 Seizures ([place])   . 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[place]) 09/11/4578    Assessment: 76 yo female admitted with bilateral hip fractures and L ankle fracture after a mechanical fall on 4/16 - Ortho recommending conservative management  On warfarin prior to admission  for AFib, admitted with supratherapeutic INR 4.5 > 8, now down to 2.9 today after Vitamin K 5 mg po x1 yesterday Warfarin PTA dose: 7.5 mg po on Mon/Wed/Fri, 5 mg on all other days Last dose of warfarin was on 4/15   Goal of Therapy:  INR 2-3 Monitor platelets by anticoagulation protocol: Yes    Plan:  -Warfarin 2.5 mg po x1 -Daily INR -Would closely evaluate risk vs benefits of anticoagulation in this patient due to recurrent falls   Harvel Quale 06/17/2016,12:13 PM

## 2016-06-17 NOTE — Progress Notes (Signed)
[facility]                                                                              Patient Demographics  [name] [name], is a 76 y.o. female, DOB - Jan 05, 1941, EZM:[id number]  Admit date - 06/15/2016   Admitting Physician Waldemar Dickens, MD  Outpatient Primary MD for the patient is [name], MD  Outpatient specialists:   LOS - 2  days    Chief Complaint  Patient presents with  . Fall  . Hip Pain       Brief summary   Patient is a 76 year old female withAfib on Coumadin anticoagulation therapy, nonobstructive CAD, HTN, HLD , diabetes mellitus, GERD, with previous history of falls resulting in bilateral hip fractures and left ankle fracture presented to ED with mechanical fall. Due to persistent pain patient underwent right hip CT which showed acute right sacral and right inferior pubic rami nondisplaced fractures. Patient stated that she did not hit her head hence refused a CT of the head.    Assessment & Plan    Pelvic fracture (HCC)/Right sacral and right inferior pubic rami nondisplaced fracture - Orthopedics was consulted and they recommended conservative management, being followed by PT, pain control, may require placement. For now patient is refusing rehabilitation. - Continue pain control  Chronic A. Fib, [place] vasc 2 score of greater than 4  -  INR was supratherapeutic, currently 2.9, pharmacy monitoring.Continue beta blocker.  Essential Hypertension -  stable on Toprol-XL.  Acute kidney injury superimposed on CKD stage 3 -  Baseline creatinine is 1.3-1.4, improved and close to baseline after gentle hydration.  Hyponatremia -  due to dehydration, resolved with IV fluids.  UTI urinary tract infection  Follow urine culture and sensitivities, on Rocephin continued.   Diabetes mellitus: Uncontrolled with hyperglycemia type 2 insulin-dependent - she is currently on home dose NovoLog 7030 along with sliding scale. Monitor.  Lab Results    Component Value Date   HGBA1C 5.7 05/01/2016   CBG (last 3)   Recent Labs  06/16/16 2154 06/17/16 0633 06/17/16 1117  GLUCAP 287* 251* 177*     Code Status: Full CODE STATUS DVT Prophylaxis: Heparin/Coumadin, SCDs  Family Communication: Discussed in detail with the patient, all imaging results, lab results explained to the patient   Disposition Plan:  Time Spent in minutes   25 minutes   Procedures:  *CT of the right hip   Consultants:   Orthopedics   Antimicrobials:   IV Rocephin 4/17>   Medications  Scheduled Meds: . atorvastatin  80 mg Oral QHS  . calcium-vitamin D  1 tablet Oral Daily  . clopidogrel  75 mg Oral Daily  . coumadin book   Does not apply Once  . feeding supplement (PRO-STAT SUGAR FREE 64)  30 mL Oral BID  . fentaNYL (SUBLIMAZE) injection  50 mcg Intravenous Once  . insulin aspart  0-5 Units Subcutaneous QHS  . insulin aspart  0-9 Units Subcutaneous TID WC  . insulin aspart protamine- aspart  10 Units Subcutaneous Q supper  . insulin aspart protamine- aspart  25 Units Subcutaneous Q breakfast  . metoprolol  succinate  25 mg Oral Daily  . pantoprazole  40 mg Oral Daily  . protein supplement  30 mL Oral BID  . sodium chloride flush  3 mL Intravenous Q12H   Continuous Infusions: . sodium chloride 75 mL (06/16/16 1222)  . cefTRIAXone (ROCEPHIN)  IV 1 g (06/17/16 1153)  . phytonadione (VITAMIN K) IV     PRN Meds:.HYDROcodone-acetaminophen, HYDROmorphone (DILAUDID) injection, ondansetron **OR** ondansetron (ZOFRAN) IV, polyethylene glycol, traZODone   Antibiotics   Anti-infectives    Start     Dose/Rate Route Frequency Ordered Stop   06/16/16 1300  cefTRIAXone (ROCEPHIN) 1 g in dextrose 5 % 50 mL IVPB     1 g 100 mL/hr over 30 Minutes Intravenous Every 24 hours 06/16/16 1139          Subjective:   [name] [name]     Objective:   Vitals:   06/16/16 0841 06/16/16 1500 06/16/16 2056 06/17/16 0451  BP: (!) 136/49 (!) 153/55 (!)  136/55 (!) 155/61  Pulse: 71 71 71 71  Resp:  16 19 19   Temp:  98.6 F (37 C) 98.6 F (37 C) 98.3 F (36.8 C)  TempSrc:  Oral Oral Oral  SpO2: 97% 97% 94% 97%  Weight:      Height:        Intake/Output Summary (Last 24 hours) at 06/17/16 1215 Last data filed at 06/17/16 0840  Gross per 24 hour  Intake          1016.75 ml  Output              300 ml  Net           716.75 ml     Wt Readings from Last 3 Encounters:  06/15/16 81.2 kg (179 lb)  05/28/16 88.7 kg (195 lb 8 oz)  05/26/16 80.7 kg (178 lb)     Exam  Awake Alert,  , No new F.N deficits, Normal affect [place].AT,PERRAL Supple Neck,No JVD, No cervical lymphadenopathy appriciated.  Symmetrical Chest wall movement, Good air movement bilaterally, CTAB RRR,No Gallops,Rubs or new Murmurs, No Parasternal Heave +ve B.Sounds, Abd Soft, No tenderness, No organomegaly appriciated, No rebound - guarding or rigidity. No Cyanosis, Clubbing or edema, No new Rash or bruise   Data Reviewed:  I have personally reviewed following labs and imaging studies  Micro Results Recent Results (from the past 240 hour(s))  MRSA PCR Screening     Status: None   Collection Time: 06/16/16  8:07 AM  Result Value Ref Range Status   MRSA by PCR NEGATIVE NEGATIVE Final    Comment:        The GeneXpert MRSA Assay (FDA approved for NASAL specimens only), is one component of a comprehensive MRSA colonization surveillance program. It is not intended to diagnose MRSA infection nor to guide or monitor treatment for MRSA infections.     Radiology Reports Ct Hip Right Wo Contrast  Result Date: 06/15/2016 CLINICAL DATA:  Status post fall today with a right hip injury. The patient is unable to walk. History of prior fixation of a right hip fracture. Initial encounter. EXAM: CT OF THE RIGHT HIP WITHOUT CONTRAST TECHNIQUE: Multidetector CT imaging of the right hip was performed according to the standard protocol. Multiplanar CT image reconstructions  were also generated. COMPARISON:  Plain films right hip earlier today. CT abdomen and pelvis 05/20/2015. FINDINGS: Bones/Joint/Cartilage Intramedullary nail and hip screw are in place for fixation of a remote healed subtrochanteric fracture on the right. The  patient has an acute nondisplaced right sacral fracture. Also seen is an acute right inferior pubic ramus fracture. No other acute fracture is identified. The right hip is located. Lower lumbar spondylosis is noted. Ligaments Suboptimally assessed by CT. Muscles and Tendons Negative. Soft tissues Extensive aortoiliac atherosclerosis is identified. No acute intrapelvic abnormality is seen. IMPRESSION: Acute nondisplaced right sacral and right inferior pubic ramus fractures. No other acute fracture is identified. Healed right subtrochanteric fracture with fixation hardware in place. Electronically Signed   By: Inge Rise M.D.   On: 06/15/2016 16:02   Dg Hip Unilat  With Pelvis 2-3 Views Right  Result Date: 06/15/2016 CLINICAL DATA:  Status post fall. The patient is complaining of pain in the right lateral hip and pelvis. EXAM: DG HIP (WITH OR WITHOUT PELVIS) 2-3V RIGHT COMPARISON:  Coronal and sagittal reconstructions from an abdominal CT scan of September 29, 2015 FINDINGS: The patient has undergone interval telescoping screw and intramedullary or rod fixation for a left hip fracture. On the right a previous ORIF with telescoping screw and intramedullary rod placement for an intertrochanteric fracture has been performed. No acute re- fracture is observed. There is an old fracture of the superior aspect of the right greater trochanter which is stable. The observed portions of the right hemipelvis exhibit no acute fracture. There is diffuse osteopenia. IMPRESSION: There is no acute or significant chronic bony abnormality of the right hip. The patient has undergone previous ORIF for an intertrochanteric fracture. The metallic hardware appears intact.  Electronically Signed   By: David  Martinique M.D.   On: 06/15/2016 11:22    Lab Data:  CBC:  Recent Labs Lab 06/15/16 1453 06/16/16 0614 06/17/16 0526  WBC 9.9 11.3* 8.5  HGB 12.1 11.2* 10.8*  HCT 36.6 34.3* 32.8*  MCV 89.5 89.6 88.2  PLT 216 213 177   Basic Metabolic Panel:  Recent Labs Lab 06/15/16 1453 06/16/16 0614 06/17/16 0526  NA 133* 136 136  K 3.9 3.5 3.3*  CL 93* 94* 96*  CO2 27 29 28   GLUCOSE 390* 349* 242*  BUN 35* 35* 25*  CREATININE 2.33* 2.10* 1.68*  CALCIUM 8.9 8.7* 8.6*   GFR: Estimated Creatinine Clearance: 33.5 mL/min (A) (by C-G formula based on SCr of 1.68 mg/dL (H)). Liver Function Tests: No results for input(s): AST, ALT, ALKPHOS, BILITOT, PROT, ALBUMIN in the last 168 hours. No results for input(s): LIPASE, AMYLASE in the last 168 hours. No results for input(s): AMMONIA in the last 168 hours. Coagulation Profile:  Recent Labs Lab 06/15/16 1453 06/16/16 1226 06/16/16 1824 06/17/16 0526  INR 4.58* 8.03* 7.11* 2.92   Cardiac Enzymes: No results for input(s): CKTOTAL, CKMB, CKMBINDEX, TROPONINI in the last 168 hours. BNP (last 3 results) No results for input(s): PROBNP in the last 8760 hours. HbA1C: No results for input(s): HGBA1C in the last 72 hours. CBG:  Recent Labs Lab 06/16/16 1042 06/16/16 1617 06/16/16 2154 06/17/16 0633 06/17/16 1117  GLUCAP 312* 357* 287* 251* 177*   Lipid Profile: No results for input(s): CHOL, HDL, LDLCALC, TRIG, CHOLHDL, LDLDIRECT in the last 72 hours. Thyroid Function Tests: No results for input(s): TSH, T4TOTAL, FREET4, T3FREE, THYROIDAB in the last 72 hours. Anemia Panel: No results for input(s): VITAMINB12, FOLATE, FERRITIN, TIBC, IRON, RETICCTPCT in the last 72 hours. Urine analysis:    Component Value Date/Time   COLORURINE AMBER (A) 06/16/2016 0015   APPEARANCEUR CLOUDY (A) 06/16/2016 0015   LABSPEC 1.007 06/16/2016 0015   PHURINE 6.0 06/16/2016 0015  GLUCOSEU >=500 (A) 06/16/2016  0015   GLUCOSEU 100 (A) 08/07/2015 0948   HGBUR MODERATE (A) 06/16/2016 0015   BILIRUBINUR NEGATIVE 06/16/2016 0015   BILIRUBINUR neg 05/16/2015 1928   KETONESUR 5 (A) 06/16/2016 0015   PROTEINUR 30 (A) 06/16/2016 0015   UROBILINOGEN 0.2 08/07/2015 0948   NITRITE NEGATIVE 06/16/2016 0015   LEUKOCYTESUR SMALL (A) 06/16/2016 0015    Signature  Lala Lund M.D on 06/17/2016 at 12:15 PM  Between 7am to 7pm - Pager - [phone] ( page via [facility], text pages only, please mention full 10 digit call back number).  After 7pm go to [email or web address] - password [facility]

## 2016-06-18 ENCOUNTER — Telehealth: Payer: Self-pay | Admitting: Internal Medicine

## 2016-06-18 LAB — CBC
HCT: 31.4 % — ABNORMAL LOW (ref 36.0–46.0)
Hemoglobin: 10.1 g/dL — ABNORMAL LOW (ref 12.0–15.0)
MCH: 28.5 pg (ref 26.0–34.0)
MCHC: 32.2 g/dL (ref 30.0–36.0)
MCV: 88.7 fL (ref 78.0–100.0)
Platelets: 227 K/uL (ref 150–400)
RBC: 3.54 MIL/uL — ABNORMAL LOW (ref 3.87–5.11)
RDW: 13.5 % (ref 11.5–15.5)
WBC: 9.5 K/uL (ref 4.0–10.5)

## 2016-06-18 LAB — URINE CULTURE: Culture: >=100000 — AB

## 2016-06-18 LAB — BASIC METABOLIC PANEL
ANION GAP: 8 (ref 5–15)
BUN: 28 mg/dL — AB (ref 6–20)
CALCIUM: 8.5 mg/dL — AB (ref 8.9–10.3)
CO2: 28 mmol/L (ref 22–32)
Chloride: 100 mmol/L — ABNORMAL LOW (ref 101–111)
Creatinine, Ser: 1.79 mg/dL — ABNORMAL HIGH (ref 0.44–1.00)
GFR calc Af Amer: 31 mL/min — ABNORMAL LOW (ref >60)
GFR calc non Af Amer: 27 mL/min — ABNORMAL LOW (ref >60)
GLUCOSE: 162 mg/dL — AB (ref 65–99)
Potassium: 4.1 mmol/L (ref 3.5–5.1)
SODIUM: 136 mmol/L (ref 135–145)

## 2016-06-18 LAB — MAGNESIUM: Magnesium: 1.6 mg/dL — ABNORMAL LOW (ref 1.7–2.4)

## 2016-06-18 LAB — PROTIME-INR
INR: 2.06
Prothrombin Time: 23.6 s — ABNORMAL HIGH (ref 11.4–15.2)

## 2016-06-18 LAB — GLUCOSE, CAPILLARY
GLUCOSE-CAPILLARY: 192 mg/dL — AB (ref 65–99)
Glucose-Capillary: 238 mg/dL — ABNORMAL HIGH (ref 65–99)

## 2016-06-18 MED ORDER — HYDROCODONE-ACETAMINOPHEN 5-325 MG PO TABS: 1.0000 | ORAL_TABLET | Freq: Four times a day (QID) | ORAL | 0 refills | Status: DC | PRN

## 2016-06-18 MED ORDER — FOSFOMYCIN TROMETHAMINE 3 G PO PACK
3.0000 g | PACK | Freq: Once | ORAL | Status: AC
  Administered 2016-06-18: 3 g via ORAL
  Filled 2016-06-18: qty 3

## 2016-06-18 MED ORDER — NITROFURANTOIN MONOHYD MACRO 100 MG PO CAPS
100.0000 mg | ORAL_CAPSULE | Freq: Two times a day (BID) | ORAL | Status: DC
  Administered 2016-06-18: 100 mg via ORAL
  Filled 2016-06-18 (×2): qty 1

## 2016-06-18 MED ORDER — NITROFURANTOIN MONOHYD MACRO 100 MG PO CAPS: 100.0000 mg | ORAL_CAPSULE | Freq: Two times a day (BID) | ORAL | 0 refills | Status: DC

## 2016-06-18 NOTE — Progress Notes (Signed)
Physical Therapy Treatment Patient Details Name: [name] [name] MRN: [id number] DOB: [birth year] Today's Date: 06/18/2016    History of Present Illness 76 year old female well known to me who had previous right hip intramedullary fixation for a intertrochanteric fracture in 2014 and a left intertrochanteric fracture with intramedullary fixation by me 10-17. Patient had postoperative drainage had irrigation and debridement 01/03/2016 with satisfactory healing. Patient was at home coming from the third floor to the second floor. When she goes to the second floor to the first floor she has a prior seed goes up and down the steps. When she goes from therefore the second floor she usually sits down on the floor and goes down on her buttocks. When she is trying to get down to the floor she fell suffering the above fractures. PMH:  Afib, DM, HTN, CHF, blindness one eye, seizures    PT Comments    Pt admitted with above diagnosis. Pt currently with functional limitations due to balance and endurance deficitsas well as pain issues. Pt still adamant that she is going home with husband even though this PT and Manuela Schwartz, CM went in to inform pt of difficulties she may have at home.  Even after informed that PT and OT as well as aide will be there sporadically pt states her and her husband will be fine.  She states her husband assures her he has someone lined up.  Will need all equipment as below and stay on 1st floor only.  Of note, pt could stand with +1 assist but could not take steps. When pointed out to pt, she states she can scoot wherever she wants to scoot.  Pt has an answer for each scenario but still concerned about her safety at home.  HH f/u being arranged as well as equipment.  Pt will benefit from skilled PT to increase their independence and safety with mobility to allow discharge to the venue listed below.     Follow Up Recommendations  Home health PT;Supervision/Assistance - 24 hour (HHOT, HHAide)      Equipment Recommendations  Wheelchair (measurements PT);Wheelchair cushion (measurements PT);Hospital bed;3in1 (PT);Rolling walker with 5" wheels (18x16 lightweight w/c, antitippers,desk armrests, footrests)    Recommendations for Other Services       Precautions / Restrictions Precautions Precautions: Fall Restrictions RLE Weight Bearing: Weight bearing as tolerated    Mobility  Bed Mobility                  Transfers Overall transfer level: Needs assistance Equipment used: Rolling walker (2 wheeled) Transfers: Sit to/from [employer] Transfers Sit to Stand: Min assist         General transfer comment: Pt in chair on arrival. Pt needed min assist to power up.  Took 3 attempts before pt could come to full standing.  Pt could not take a step with either LE.  Painful therefore sat back down.    Ambulation/Gait             General Gait Details: Pt unable   Stairs            Wheelchair Mobility    Modified Rankin (Stroke Patients Only)       Balance Overall balance assessment: Needs assistance;History of Falls Sitting-balance support: No upper extremity supported;Feet supported Sitting balance-Leahy Scale: Fair     Standing balance support: Bilateral upper extremity supported;During functional activity Standing balance-Leahy Scale: Poor Standing balance comment: relies on UEs for support with RW. Did stand fully upright  Cognition Arousal/Alertness: Awake/alert Behavior During Therapy: WFL for tasks assessed/performed Overall Cognitive Status: Impaired/Different from baseline Area of Impairment: Safety/judgement;Problem solving                         Safety/Judgement: Decreased awareness of safety   Problem Solving: Difficulty sequencing;Requires verbal cues;Slow processing General Comments: Pt lacks insight into difficulties she may face at home.      Exercises General Exercises  - Lower Extremity Ankle Circles/Pumps: AROM;Both;10 reps;Supine Quad Sets: AROM;Both;10 reps;Supine Long Arc Quad: AROM;Both;10 reps;Seated Heel Slides: AAROM;Both;10 reps;Supine    General Comments        Pertinent Vitals/Pain Pain Assessment: Faces Faces Pain Scale: Hurts worst Pain Location: right pelvis Pain Descriptors / Indicators: Aching;Grimacing;Guarding Pain Intervention(s): Limited activity within patient's tolerance;Monitored during session;Repositioned;Patient requesting pain meds-RN notified    Home Living                      Prior Function            PT Goals (current goals can now be found in the care plan section) Progress towards PT goals: Progressing toward goals (slowly)    Frequency    Min 5X/week      PT Plan Current plan remains appropriate    Co-evaluation             End of Session Equipment Utilized During Treatment: Gait belt Activity Tolerance: Patient limited by fatigue;Patient limited by pain Patient left: in chair;with call bell/phone within reach;with chair alarm set Nurse Communication: Mobility status;Patient requests pain meds PT Visit Diagnosis: Unsteadiness on feet (R26.81);Muscle weakness (generalized) (M62.81);Pain Pain - Right/Left: Right Pain - part of body: Leg (pelvis)     Time: 1209-1229 PT Time Calculation (min) (ACUTE ONLY): 20 min  Charges:  $Therapeutic Activity: 8-22 mins                    G Codes:       [place] [phone] (pager)    Denice Paradise 06/18/2016, 1:19 PM

## 2016-06-18 NOTE — Progress Notes (Signed)
ANTICOAGULATION CONSULT NOTE  Pharmacy Consult:  Coumadin Indication: atrial fibrillation  Allergies  Allergen Reactions  . Procaine Hcl Anaphylaxis  . Amoxicillin Itching    Patient Measurements: Height: 5\' 9"  (175.3 cm) Weight: 179 lb (81.2 kg) IBW/kg (Calculated) : 66.2  Vital Signs: Temp: (P) 98.5 F (36.9 C) (04/19 0926) Temp Source: (P) Oral (04/19 0926) Pulse Rate: (P) 73 (04/19 0926)  Labs:  Recent Labs  06/16/16 0614  06/16/16 1824 06/17/16 0526 06/18/16 0453  HGB 11.2*  --   --  10.8* 10.1*  HCT 34.3*  --   --  32.8* 31.4*  PLT 213  --   --  215 227  LABPROT  --   < > 63.4* 31.1* 23.6*  INR  --   < > 7.11* 2.92 2.06  CREATININE 2.10*  --   --  1.68* 1.79*  < > = values in this interval not displayed.  Estimated Creatinine Clearance: 31.4 mL/min (A) (by C-G formula based on SCr of 1.79 mg/dL (H)).    Assessment: 21 YOF admitted with bilateral hip fractures and left ankle fracture after a mechanical fall on 06/15/13.  No surgery indicated and Pharmacy consulted to continue Coumadin from PTA for history of AFib.  He received Vitamin K 5mg  on 06/16/16.  INR currently therapeutic; no bleeding reported.  Home Coumadin dose: 5mg  daily except 7.5 mg MWF   Goal of Therapy:  INR 2-3    Plan:  - Coumadin 5mg  PO today if still here - Daily PT / INR   Hazle Ogburn D. Mina Marble, PharmD, BCPS Pager:  [phone] 06/18/2016, 11:29 AM

## 2016-06-18 NOTE — Progress Notes (Signed)
Discharge instructions (including medications) discussed with and copy provided to patient/caregiver 

## 2016-06-18 NOTE — Care Management Note (Signed)
Case Management Note  Patient Details  Name: [name] [name] MRN: [id number] Date of Birth: 1940/09/08  Subjective/Objective:    76 yr old female s/p fall, admitted with acute right sacral and the right inferior pubic ramus nondisplaced fractures. No surgery indicated.            Action/Plan:  Case manager spoke with patient concerning discharge plan and needs. Patient is adamant that she will not go to SNF, will return home. Patient has said that she has a 3 level home and bedrooms and bath are on upper level,patient states she will have hospital bed setup on lower level. Her husband will assist her. She states she gave her wheelchair and rolling walker to her sister so her husband will be getting her a wheelchair and a walker. CM offered patient choice for Home Health, called referral to [facility], [place].  Expected Discharge Date:  06/18/16               Expected Discharge Plan:  [place]  In-House Referral:  NA  Discharge planning Services  CM Consult  Post Acute Care Choice:  Durable Medical Equipment, Home Health Choice offered to:  Patient  DME Arranged:  Hospital bed DME Agency:  [facility]:  PT, RN, OT, Nurse's Aide, Social Work [employer] Agency:  [place]  Status of Service:  Completed, signed off  If discussed at [employer], dates discussed:    Additional Comments:  Ninfa Meeker, RN 06/18/2016, 12:31 PM

## 2016-06-18 NOTE — Discharge Summary (Signed)
[name] [name] WGN:[id number] DOB: 1940/11/24 DOA: 06/15/2016  PCP: [name], MD  Admit date: 06/15/2016  Discharge date: 06/18/2016  Admitted From: Home   Disposition:  Home   Recommendations for Outpatient Follow-up:   Follow up with PCP in 1-2 weeks  PCP Please obtain BMP/CBC, 2 view CXR in 1week,  (see Discharge instructions)   PCP Please follow up on the following pending results: None   Home Health: PT,RN   Equipment/Devices: Walker  Consultations: Ortho Dr Lorin Mercy Discharge Condition: Stable   CODE STATUS: Full   Diet Recommendation: Diet heart healthy/carb modified    Chief Complaint  Patient presents with  . Fall  . Hip Pain     Brief history of present illness from the day of admission and additional interim summary    Patient is a 76 year old female withAfib on Coumadin anticoagulation therapy, nonobstructive CAD, HTN, HLD , diabetes mellitus, GERD, with previous history of fallsresulting in bilateral hip fractures and left ankle fracture presented to ED with mechanical fall. Due to persistent pain patient underwent right hip CT which showed acute right sacral and right inferior pubic rami nondisplaced fractures. Patient stated that she did not hit her head hence refused a CT of the head.                                                                  Hospital Course   Pelvic fracture (HCC)/Right sacral and right inferior pubic rami nondisplaced fracture - Orthopedics was consulted and they recommended conservative management, seen by PT and HHPT recommended and arranged for, Note patient has refused placement as well, will discharge with rolling walker, pain medicines and outpatient follow-up with orthopedics and PCP.  Chronic A. Fib, [place] vasc 2 score of greater than 4  - INR was  supratherapeutic, currently 2.9, pharmacy monitoring.Continue beta blocker.  Essential Hypertension - stable on Toprol-XL.  Acute kidney injury superimposed on CKD stage 3 -  Baseline creatinine is 1.3-1.4, improved and close to baseline after gentle hydration.  Hyponatremia - due to dehydration, resolved with IV fluids.  UTI urinary tract infection   placed on nitrofurantoin based on urine cultures and sensitivity for total of 6 doses. She also received a dose of fosfomycin.  Diabetes mellitus: Poor outpatient control continue home regimen follow with PCP for glycemic control.  Lab Results  Component Value Date   HGBA1C 8.6 (H) 06/17/2016   CBG (last 3)   Recent Labs  06/17/16 1702 06/17/16 2057 06/18/16 0857  GLUCAP 159* 167* 238*       Discharge diagnosis     Principal Problem:   Pelvic fracture (HCC) Active Problems:   Essential hypertension   Atrial fibrillation (HCC)   Acute kidney injury superimposed on chronic kidney disease (HCC)   CAD (coronary artery disease)  Anticoagulated on Coumadin   Anemia    Discharge instructions    Discharge Instructions    Discharge instructions    Complete by:  As directed    Follow with Primary MD [name], MD in 4 days   Get CBC, CMP, INR, 2 view Chest X ray checked  by Primary MD or SNF MD in 4 days ( we routinely change or add medications that can affect your baseline labs and fluid status, therefore we recommend that you get the mentioned basic workup next visit with your PCP, your PCP may decide not to get them or add new tests based on their clinical decision)  Activity: As tolerated with Full fall precautions use walker/cane & assistance as needed  Disposition Home    Diet:  Heart Healthy Low Carb.  Accuchecks 4 times/day, Once in AM empty stomach and then before each meal. Log in all results and show them to your Prim.MD in 3 days. If any glucose reading is under 80 or above 300 call your Prim MD  immidiately. Follow Low glucose instructions for glucose under 80 as instructed.   For Heart failure patients - Check your Weight same time everyday, if you gain over 2 pounds, or you develop in leg swelling, experience more shortness of breath or chest pain, call your Primary MD immediately. Follow Cardiac Low Salt Diet and 1.5 lit/day fluid restriction.  On your next visit with your primary care physician please Get Medicines reviewed and adjusted.  Please request your Prim.MD to go over all Hospital Tests and Procedure/Radiological results at the follow up, please get all Hospital records sent to your Prim MD by signing hospital release before you go home.  If you experience worsening of your admission symptoms, develop shortness of breath, life threatening emergency, suicidal or homicidal thoughts you must seek medical attention immediately by calling 911 or calling your MD immediately  if symptoms less severe.  You Must read complete instructions/literature along with all the possible adverse reactions/side effects for all the Medicines you take and that have been prescribed to you. Take any new Medicines after you have completely understood and accpet all the possible adverse reactions/side effects.   Do not drive, operate heavy machinery, perform activities at heights, swimming or participation in water activities or provide baby sitting services if your were admitted for syncope or siezures until you have seen by Primary MD or a Neurologist and advised to do so again.  Do not drive when taking Pain medications.    Do not take more than prescribed Pain, Sleep and Anxiety Medications  Special Instructions: If you have smoked or chewed Tobacco  in the last 2 yrs please stop smoking, stop any regular Alcohol  and or any Recreational drug use.  Wear Seat belts while driving.   Please note  You were cared for by a hospitalist during your hospital stay. If you have any questions about  your discharge medications or the care you received while you were in the hospital after you are discharged, you can call the unit and asked to speak with the hospitalist on call if the hospitalist that took care of you is not available. Once you are discharged, your primary care physician will handle any further medical issues. Please note that NO REFILLS for any discharge medications will be authorized once you are discharged, as it is imperative that you return to your primary care physician (or establish a relationship with a primary care physician if you do not  have one) for your aftercare needs so that they can reassess your need for medications and monitor your lab values.   Increase activity slowly    Complete by:  As directed       Discharge Medications   Allergies as of 06/18/2016      Reactions   Procaine Hcl Anaphylaxis   Amoxicillin Itching      Medication List    TAKE these medications   atorvastatin 80 MG tablet Commonly known as:  LIPITOR Take 1 tablet (80 mg total) by mouth daily.   calcium-vitamin D 500-200 MG-UNIT tablet Commonly known as:  OSCAL WITH D Take 1 tablet by mouth daily.   clopidogrel 75 MG tablet Commonly known as:  PLAVIX Take 1 tablet (75 mg total) by mouth daily.   escitalopram 10 MG tablet Commonly known as:  LEXAPRO Take 20 mg by mouth daily.   furosemide 40 MG tablet Commonly known as:  LASIX Take 1 tablet (40 mg total) by mouth 2 (two) times daily. What changed:  when to take this  reasons to take this   HYDROcodone-acetaminophen 5-325 MG tablet Commonly known as:  NORCO/VICODIN Take 1-2 tablets by mouth every 6 (six) hours as needed for moderate pain or severe pain.   insulin NPH-regular Human (70-30) 100 UNIT/ML injection Commonly known as:  NOVOLIN 70/30 Inject 10-25 Units into the skin See admin instructions. 25 units in the morning before breakfast and 10 units before the evening meal   metoprolol succinate 50 MG 24 hr  tablet Commonly known as:  TOPROL-XL Take 25 mg by mouth daily. Take with or immediately following a meal.   MULTIVITAMIN ADULT PO Take 1 tablet by mouth daily with breakfast.   nitrofurantoin (macrocrystal-monohydrate) 100 MG capsule Commonly known as:  MACROBID Take 1 capsule (100 mg total) by mouth every 12 (twelve) hours.   pantoprazole 40 MG tablet Commonly known as:  PROTONIX Take 1 tablet (40 mg total) by mouth daily.   promethazine 12.5 MG tablet Commonly known as:  PHENERGAN Take 1 tablet (12.5 mg total) by mouth every 6 (six) hours as needed for nausea or vomiting.   triamcinolone ointment 0.5 % Commonly known as:  KENALOG Apply 1 application topically 4 (four) times daily. As needed for itching   warfarin 2.5 MG tablet Commonly known as:  COUMADIN TAKE ONE TABLET BY MOUTH ONCE DAILY What changed:  See the new instructions.            Durable Medical Equipment        Start     Ordered   06/18/16 [phone]  For home use only DME Walker rolling  Once    Comments:  5 wheel  Question:  Patient needs a walker to treat with the following condition  Answer:  Pelvic fracture ([place])   06/18/16 0823      Follow-up Information    [name], MD Follow up in 2 week(s).   Specialty:  Orthopedic Surgery Contact information: [place] [address] [phone]        [name], MD. Schedule an appointment as soon as possible for a visit in 4 day(s).   Specialty:  Internal Medicine Contact information: [address] [place] [address] [phone]           Major procedures and Radiology Reports - PLEASE review detailed and final reports thoroughly  -        Ct Hip Right Wo Contrast  Result Date: 06/15/2016 CLINICAL DATA:  Status post fall today with a right hip injury. The patient is unable to walk. History of prior fixation of a right hip fracture. Initial encounter. EXAM: CT OF THE RIGHT HIP WITHOUT CONTRAST  TECHNIQUE: Multidetector CT imaging of the right hip was performed according to the standard protocol. Multiplanar CT image reconstructions were also generated. COMPARISON:  Plain films right hip earlier today. CT abdomen and pelvis 05/20/2015. FINDINGS: Bones/Joint/Cartilage Intramedullary nail and hip screw are in place for fixation of a remote healed subtrochanteric fracture on the right. The patient has an acute nondisplaced right sacral fracture. Also seen is an acute right inferior pubic ramus fracture. No other acute fracture is identified. The right hip is located. Lower lumbar spondylosis is noted. Ligaments Suboptimally assessed by CT. Muscles and Tendons Negative. Soft tissues Extensive aortoiliac atherosclerosis is identified. No acute intrapelvic abnormality is seen. IMPRESSION: Acute nondisplaced right sacral and right inferior pubic ramus fractures. No other acute fracture is identified. Healed right subtrochanteric fracture with fixation hardware in place. Electronically Signed   By: Inge Rise M.D.   On: 06/15/2016 16:02   Dg Hip Unilat  With Pelvis 2-3 Views Right  Result Date: 06/15/2016 CLINICAL DATA:  Status post fall. The patient is complaining of pain in the right lateral hip and pelvis. EXAM: DG HIP (WITH OR WITHOUT PELVIS) 2-3V RIGHT COMPARISON:  Coronal and sagittal reconstructions from an abdominal CT scan of September 29, 2015 FINDINGS: The patient has undergone interval telescoping screw and intramedullary or rod fixation for a left hip fracture. On the right a previous ORIF with telescoping screw and intramedullary rod placement for an intertrochanteric fracture has been performed. No acute re- fracture is observed. There is an old fracture of the superior aspect of the right greater trochanter which is stable. The observed portions of the right hemipelvis exhibit no acute fracture. There is diffuse osteopenia. IMPRESSION: There is no acute or significant chronic bony abnormality  of the right hip. The patient has undergone previous ORIF for an intertrochanteric fracture. The metallic hardware appears intact. Electronically Signed   By: David  Martinique M.D.   On: 06/15/2016 11:22    Micro Results    Recent Results (from the past 240 hour(s))  Urine culture     Status: Abnormal   Collection Time: 06/16/16 12:15 AM  Result Value Ref Range Status   Specimen Description URINE, CLEAN CATCH  Final   Special Requests NONE  Final   Culture (A)  Final    >=100,000 COLONIES/mL KLEBSIELLA PNEUMONIAE Confirmed Extended Spectrum Beta-Lactamase Producer (ESBL)    Report Status 06/18/2016 FINAL  Final   Organism ID, Bacteria KLEBSIELLA PNEUMONIAE (A)  Final      Susceptibility   Klebsiella pneumoniae - MIC*    AMPICILLIN >=32 RESISTANT Resistant     CEFAZOLIN >=64 RESISTANT Resistant     CEFTRIAXONE >=64 RESISTANT Resistant     CIPROFLOXACIN >=4 RESISTANT Resistant     GENTAMICIN >=16 RESISTANT Resistant     IMIPENEM 0.5 SENSITIVE Sensitive     NITROFURANTOIN 32 SENSITIVE Sensitive     TRIMETH/SULFA <=20 SENSITIVE Sensitive     AMPICILLIN/SULBACTAM >=32 RESISTANT Resistant     PIP/TAZO 64 INTERMEDIATE Intermediate     Extended ESBL POSITIVE Resistant     * >=100,000 COLONIES/mL KLEBSIELLA PNEUMONIAE  MRSA PCR Screening     Status: None   Collection Time: 06/16/16  8:07 AM  Result Value Ref Range Status   MRSA by PCR  NEGATIVE NEGATIVE Final    Comment:        The GeneXpert MRSA Assay (FDA approved for NASAL specimens only), is one component of a comprehensive MRSA colonization surveillance program. It is not intended to diagnose MRSA infection nor to guide or monitor treatment for MRSA infections.     Today   Subjective    [name] [name] today has no headache,no chest abdominal pain,no new weakness tingling or numbness, feels much better wants to go home today.    Objective   Blood pressure (!) 118/52, pulse 71, temperature 98.7 F (37.1 C),  temperature source Oral, resp. rate 19, height 5\' 9"  (1.753 m), weight 81.2 kg (179 lb), SpO2 98 %.   Intake/Output Summary (Last 24 hours) at 06/18/16 0941 Last data filed at 06/17/16 1533  Gross per 24 hour  Intake              236 ml  Output                0 ml  Net              236 ml    Exam Awake Alert, Oriented x 3, No new F.N deficits, Normal affect [place].AT,PERRAL Supple Neck,No JVD, No cervical lymphadenopathy appriciated.  Symmetrical Chest wall movement, Good air movement bilaterally, CTAB RRR,No Gallops,Rubs or new Murmurs, No Parasternal Heave +ve B.Sounds, Abd Soft, Non tender, No organomegaly appriciated, No rebound -guarding or rigidity. No Cyanosis, Clubbing or edema, No new Rash or bruise   Data Review   CBC w Diff:  Lab Results  Component Value Date   WBC 9.5 06/18/2016   HGB 10.1 (L) 06/18/2016   HCT 31.4 (L) 06/18/2016   PLT 227 06/18/2016   LYMPHOPCT 29 04/13/2016   MONOPCT 10 04/13/2016   EOSPCT 1 04/13/2016   BASOPCT 0 04/13/2016    CMP:  Lab Results  Component Value Date   NA 136 06/18/2016   NA 139 10/03/2015   K 4.1 06/18/2016   CL 100 (L) 06/18/2016   CO2 28 06/18/2016   BUN 28 (H) 06/18/2016   BUN 47 (A) 10/03/2015   CREATININE 1.79 (H) 06/18/2016   CREATININE 1.73 (H) 08/01/2015   GLU 114 10/03/2015   PROT 6.5 01/02/2016   ALBUMIN 2.4 (L) 01/02/2016   BILITOT 0.7 01/02/2016   ALKPHOS 181 (H) 01/02/2016   AST 22 01/02/2016   ALT 11 (L) 01/02/2016  .   Total Time in preparing paper work, data evaluation and todays exam - 40 minutes  Lala Lund M.D on 06/18/2016 at 9:41 AM  [facility]   Office  [phone]

## 2016-06-19 NOTE — Telephone Encounter (Signed)
Transition Care Management Follow-up Telephone Call  Per Discharge Summary: Admit date: 06/15/2016  Discharge date: 06/18/2016  Admitted From: Home   Disposition:  Home   Recommendations for Outpatient Follow-up:   Follow up with PCP in 1-2 weeks  PCP Please obtain BMP/CBC, 2 view CXR in 1week,  (see Discharge instructions)   PCP Please follow up on the following pending results: None   Home Health: PT,RN   Equipment/Devices: Environmental consultant  Consultations: Ortho Dr Lorin Mercy Discharge Condition: Stable   CODE STATUS: Full   Diet Recommendation: Diet heart healthy/carb modified    --   How have you been since you were released from the hospital? Patient reports she is having "extreme difficulty" with mobility and transfers due to sacral/coccygeal fracture. She states it is hard to walk, and hard to sit in the wheelchair due to pain. She is in pain and reports she just took her pain medicine. (Note: patient was recommended to go to rehab facility on discharge, but refused. She lives in a 3 story house w/ bedrooms and baths on upper level.)   Do you understand why you were in the hospital? yes   Do you understand the discharge instructions? yes   Where were you discharged to? Home  Items Reviewed:  Medications reviewed: yes  Allergies reviewed: yes  Dietary changes reviewed: yes  Referrals reviewed: yes, home health. Patient reports services will start Monday. I called [facility], spoke w/ Shana. She is going to call patient today and offer her a sooner appointment for today or tomorrow given the difficulty w/ mobility she reported at time of call.  Functional Questionnaire:   Activities of Daily Living (ADLs):   She states they are independent in the following: feeding and grooming States they require assistance with the following: ambulation, bathing and hygiene, continence, toileting and dressing. Husband is assisting with ADLs.   Any transportation issues/concerns?:  no   Any patient concerns? no   Confirmed importance and date/time of follow-up visits scheduled yes  Provider Appointment booked with Dr. [name] 06/24/16 @ 11:30am.  Confirmed with patient if condition begins to worsen call PCP or go to the ER.  Patient was given the office number and encouraged to call back with question or concerns.  : yes

## 2016-06-19 NOTE — Telephone Encounter (Signed)
thx

## 2016-06-23 ENCOUNTER — Telehealth: Payer: Self-pay | Admitting: Internal Medicine

## 2016-06-23 ENCOUNTER — Ambulatory Visit: Payer: Medicare Other | Admitting: Nurse Practitioner

## 2016-06-23 NOTE — Telephone Encounter (Signed)
Caller name: Altha Harm Relationship to patient: [place] Can be reached: [phone] Pharmacy:  Reason for call: Request verbal orders for PT 2 times a week for 4 weeks to work on balancing, gait training, transfers, and strengthening.

## 2016-06-23 NOTE — Telephone Encounter (Signed)
Spoke w/ Altha Harm, verbal orders given.

## 2016-06-24 ENCOUNTER — Inpatient Hospital Stay: Payer: PRIVATE HEALTH INSURANCE | Admitting: Internal Medicine

## 2016-06-24 NOTE — Telephone Encounter (Signed)
Tried calling [name] [name], PT w/ [facility] [phone]. LMOM requesting call back, need to know if Pt has nursing coming out to home as well and if [facility] has ability to perform a mobile chest x-ray, if not [facility] in [place] does have mobile chest x-ray.

## 2016-06-24 NOTE — Telephone Encounter (Signed)
Patient had to cancel appointment today, according to the discharge summary she needs a BMP, CBC and chest x-ray. Spoke with the patient's husband today, she still in pain thus decided not to come. Please arrange a BMP, CBC and x-ray either by home health or at this office without the need off office visit. Dx-- pelvic fracture If she has other problems like chest pain, difficulty breathing, fever chills: Let me know.

## 2016-06-25 ENCOUNTER — Telehealth: Payer: Self-pay | Admitting: Internal Medicine

## 2016-06-25 ENCOUNTER — Ambulatory Visit (INDEPENDENT_AMBULATORY_CARE_PROVIDER_SITE_OTHER): Payer: PRIVATE HEALTH INSURANCE | Admitting: Cardiovascular Disease

## 2016-06-25 DIAGNOSIS — I482 Chronic atrial fibrillation, unspecified: Secondary | ICD-10-CM

## 2016-06-25 DIAGNOSIS — Z5181 Encounter for therapeutic drug level monitoring: Secondary | ICD-10-CM

## 2016-06-25 LAB — POCT INR
INR: 4.3
INR: 4.3 — AB (ref 0.9–1.1)

## 2016-06-25 LAB — BASIC METABOLIC PANEL
BUN: 30 mg/dL — AB (ref 4–21)
CREATININE: 1.6 mg/dL — AB (ref 0.5–1.1)
Glucose: 199 mg/dL
Potassium: 4.4 mmol/L (ref 3.4–5.3)
Sodium: 133 mmol/L — AB (ref 137–147)

## 2016-06-25 LAB — CBC AND DIFFERENTIAL
HEMATOCRIT: 34 % — AB (ref 36–46)
Hemoglobin: 11.8 g/dL — AB (ref 12.0–16.0)
PLATELETS: 421 10*3/uL — AB (ref 150–399)
WBC: 12.7 10*3/mL

## 2016-06-25 NOTE — Telephone Encounter (Signed)
thx

## 2016-06-25 NOTE — Telephone Encounter (Signed)
Spoke w/ Anderson Malta, RN w/ [facility], she called needing orders for INR since Pt missed her appt yesterday, informed okay to do however results will need to be called over the cardiology. Also, informed that Pt needs chest x-ray (which they can provide) and BMP, CBC. Results will be sent to office.

## 2016-06-25 NOTE — Telephone Encounter (Signed)
Received BMP and CBC lab results from [facility] in PCP red folder for review (have not seen chest x-ray results yet).

## 2016-06-26 NOTE — Telephone Encounter (Signed)
Was admitted to the hospital after a mechanical fall, had an acute right sacral and right inferior pubic rami FX nondisplaced fracture. Creatinine was a slightly increased, had hyponatremia, also a UTI (Klebsiella, treated with Macrobid). Labs from 06/25/2016 reviewed Creatinine is now 1.57 (1.79 at the time of discharge) Sodium 133 (was136 at time of discharge White count 12.57, hemoglobin 11.8 (9.5 and then 10.1 respectively at time of discharge) Advise patient: Labs  Okay. Does have a slightly elevated WBCs, be sure she takes her antibiotics for UTI and call if she is having fever, chills, persistent UTI symptoms or cough and sputum production. Chest x-ray pending Also, INR 4.3. Recommend to hold Coumadin until she hears from cardiology. Fax results to cardiology.

## 2016-06-26 NOTE — Telephone Encounter (Signed)
error:[id number] ° °

## 2016-06-26 NOTE — Telephone Encounter (Addendum)
Results abstracted and sent for scanning. Recommendations sent to Pt via [place].

## 2016-07-01 ENCOUNTER — Ambulatory Visit: Payer: Medicare Other | Admitting: Endocrinology

## 2016-07-02 ENCOUNTER — Telehealth: Payer: Self-pay | Admitting: *Deleted

## 2016-07-02 ENCOUNTER — Ambulatory Visit (INDEPENDENT_AMBULATORY_CARE_PROVIDER_SITE_OTHER): Payer: PRIVATE HEALTH INSURANCE | Admitting: Cardiology

## 2016-07-02 DIAGNOSIS — Z5181 Encounter for therapeutic drug level monitoring: Secondary | ICD-10-CM

## 2016-07-02 DIAGNOSIS — I482 Chronic atrial fibrillation, unspecified: Secondary | ICD-10-CM

## 2016-07-02 LAB — PROTIME-INR: INR: 5.1 — AB (ref 0.9–1.1)

## 2016-07-02 LAB — POCT INR: INR: 6.8

## 2016-07-02 NOTE — Telephone Encounter (Signed)
Received request for "legible signature" on mobile medical services for Chest Xray for SOB, forwarded to provider/SLS 05/03

## 2016-07-02 NOTE — Addendum Note (Signed)
Addended by: Zenovia Jarred on: 07/02/2016 02:38 PM   Modules accepted: Level of Service

## 2016-07-06 ENCOUNTER — Telehealth: Payer: Self-pay | Admitting: *Deleted

## 2016-07-06 ENCOUNTER — Telehealth: Payer: Self-pay | Admitting: Internal Medicine

## 2016-07-06 NOTE — Telephone Encounter (Signed)
Received Physician Orders from [facility], forwarded to provider/SLS 05/07

## 2016-07-06 NOTE — Telephone Encounter (Signed)
Spoke w/ Altha Harm, VO given w/ recommendations.

## 2016-07-06 NOTE — Telephone Encounter (Signed)
Caller name: Altha Harm PT with [facility] Can be reached: [phone]  Reason for call: reporting that pt had another fall this morning. She felt shaky and lost control. Fell on arm of recliner hitting the right side where she has pelvic frx. Pt is experiencing more pain than before. [facility] works with Company secretary in home for pts. Requesting VO for mobile xray.

## 2016-07-06 NOTE — Telephone Encounter (Signed)
Okay for a pelvic x-ray, DX fall and a recent fracture. If she has any headache, neck pain, numbness and the extremities: Needs to go to the ER

## 2016-07-07 ENCOUNTER — Telehealth: Payer: Self-pay

## 2016-07-07 ENCOUNTER — Ambulatory Visit: Payer: Medicare Other | Admitting: Neurology

## 2016-07-07 NOTE — Telephone Encounter (Signed)
Patient was a no show for appt today with Dr.Sethi.

## 2016-07-08 ENCOUNTER — Encounter: Payer: Self-pay | Admitting: Internal Medicine

## 2016-07-08 ENCOUNTER — Encounter: Payer: Self-pay | Admitting: Neurology

## 2016-07-08 ENCOUNTER — Telehealth: Payer: Self-pay

## 2016-07-08 NOTE — Telephone Encounter (Signed)
error:[id number] ° °

## 2016-07-08 NOTE — Telephone Encounter (Signed)
Spoke w/ Altha Harm, informed of recommendations.

## 2016-07-08 NOTE — Telephone Encounter (Signed)
Asymptomatic orthostatic hypotension, last hemoglobin stable. Recommend to hold Lasix for 5 days ER if any symptoms such as chest pain, dizziness. Also if any evidence of bleeding such as stomach pain, change in the color of the stools. Ask them to repeat  orthostatic 07/10/2016 and let me know. Recommend to notify endocrinology regards blood sugars.

## 2016-07-08 NOTE — Telephone Encounter (Signed)
Received x-ray report, placed in MD red folder for review.

## 2016-07-08 NOTE — Telephone Encounter (Signed)
Orders faxed

## 2016-07-08 NOTE — Telephone Encounter (Signed)
Spoke w/ Altha Harm, PT w/ [facility] [phone], called to report she believes Pt is having asymptomatic orthostatic hypotension: BP sitting: 120/60, once standing: BP dipped to 70/40. Blood sugar this morning is 332! Xray techs will be out to home today at noon to x-ray hip from fall on Monday. Altha Harm also reporting several ulcers on Pt's feet, nursing is visiting home tomorrow, denies fever, chills. Informed that I felt Pt should go on to ED, Altha Harm unsure. Informed I or PCP would call back with recommendations.

## 2016-07-09 ENCOUNTER — Ambulatory Visit (INDEPENDENT_AMBULATORY_CARE_PROVIDER_SITE_OTHER): Payer: PRIVATE HEALTH INSURANCE | Admitting: Internal Medicine

## 2016-07-09 ENCOUNTER — Telehealth: Payer: Self-pay | Admitting: Internal Medicine

## 2016-07-09 ENCOUNTER — Telehealth: Payer: Self-pay | Admitting: *Deleted

## 2016-07-09 DIAGNOSIS — I482 Chronic atrial fibrillation, unspecified: Secondary | ICD-10-CM

## 2016-07-09 DIAGNOSIS — Z5181 Encounter for therapeutic drug level monitoring: Secondary | ICD-10-CM

## 2016-07-09 LAB — PROTIME-INR: INR: 5.7 — AB (ref 0.9–1.1)

## 2016-07-09 NOTE — Telephone Encounter (Signed)
Pt seen by [facility] at Cardiology. See OV note from earlier today.

## 2016-07-09 NOTE — Telephone Encounter (Signed)
I spoke with the patient today, the patient is unable to see the area but has been told she has blisters at the heels. Please call [facility]: Likely is a early pressure ulcer, recommend to: Change position frequently Use B  suspension boots or sheep skin

## 2016-07-09 NOTE — Telephone Encounter (Addendum)
Caller name: Judeen Hammans  Relation to pt: LPN from [name] Call back number: [phone] Pharmacy: [place], [place] [phone] (Phone) [phone] (Fax)      Reason for call:  Requesting Rx for blisters on bilaterl heel, please advise

## 2016-07-09 NOTE — Telephone Encounter (Signed)
Please advise 

## 2016-07-09 NOTE — Telephone Encounter (Signed)
[facility] reporting critical INR @ 5.7

## 2016-07-09 NOTE — Telephone Encounter (Signed)
This has already been managed

## 2016-07-09 NOTE — Telephone Encounter (Addendum)
X-rays from 07/04/2016  R hip-pelvis reviewed, intact proximal hardware (ORIF, femur fracture)  distal however complication not excluded. I spoke with the patient today, the fall was mechanical, not due to dizziness, no further falls. Pain is not a major issue at this time.

## 2016-07-09 NOTE — Telephone Encounter (Signed)
Spoke w/ Judeen Hammans, informed of recommendations. Also, informed x-rays normal.

## 2016-07-10 ENCOUNTER — Telehealth: Payer: Self-pay | Admitting: Endocrinology

## 2016-07-10 ENCOUNTER — Telehealth: Payer: Self-pay | Admitting: Internal Medicine

## 2016-07-10 DIAGNOSIS — L97409 Non-pressure chronic ulcer of unspecified heel and midfoot with unspecified severity: Principal | ICD-10-CM

## 2016-07-10 DIAGNOSIS — E11621 Type 2 diabetes mellitus with foot ulcer: Secondary | ICD-10-CM

## 2016-07-10 NOTE — Telephone Encounter (Signed)
Please call PCP about skin ulcers. For DM, Please continue the same insulin for now. Please call us next week, to tell us how the blood sugar is doing

## 2016-07-10 NOTE — Telephone Encounter (Signed)
Gave patient results and she stated an understanding  

## 2016-07-10 NOTE — Telephone Encounter (Signed)
[facility] called and said that she had a fall and they are currently taking care of her for that, the nurse said that her blood sugar level today was 334 and they needed to report this to Dr. [name].  She also has pressure sores on her legs.

## 2016-07-10 NOTE — Telephone Encounter (Signed)
Dr. [name] - Please advise. Thanks!

## 2016-07-10 NOTE — Telephone Encounter (Signed)
Please advise as you spoke with Pt yesterday regarding blister.

## 2016-07-10 NOTE — Telephone Encounter (Signed)
Pt's spouse Jenny Reichmann called in to request to speak with CMA. He and spouse would like a call back. He says that Dr. Cordelia Pen office is requesting that provider place a referral to have Ms. Anamae's heel looked at due to her having a blister on it.    CB: [phone]

## 2016-07-10 NOTE — Telephone Encounter (Signed)
Spoke w/ Pt, she informed that Dr. [name] recommended she see podiatry for blisters/ulcers, referral placed.

## 2016-07-10 NOTE — Telephone Encounter (Signed)
Please call [name], clarify what they need. I actually talked with the patient personally, she told me she has blister in the heels and we already contacted [facility] with recommendations. See previous note.  Do they  need anything else or something different?

## 2016-07-13 ENCOUNTER — Telehealth: Payer: Self-pay | Admitting: Internal Medicine

## 2016-07-13 NOTE — Telephone Encounter (Signed)
Caller name:[place] from [facility] Relationship to patient: Can be [phone] Pharmacy:  Reason for call:needs verbal order to continue on PT

## 2016-07-13 NOTE — Telephone Encounter (Signed)
LMOM w/ verbal orders to continue PT.

## 2016-07-16 ENCOUNTER — Ambulatory Visit (INDEPENDENT_AMBULATORY_CARE_PROVIDER_SITE_OTHER): Payer: PRIVATE HEALTH INSURANCE | Admitting: Cardiology

## 2016-07-16 ENCOUNTER — Telehealth: Payer: Self-pay | Admitting: Internal Medicine

## 2016-07-16 DIAGNOSIS — I4891 Unspecified atrial fibrillation: Secondary | ICD-10-CM | POA: Diagnosis not present

## 2016-07-16 DIAGNOSIS — Z7901 Long term (current) use of anticoagulants: Secondary | ICD-10-CM | POA: Diagnosis not present

## 2016-07-16 DIAGNOSIS — I482 Chronic atrial fibrillation, unspecified: Secondary | ICD-10-CM

## 2016-07-16 DIAGNOSIS — I4892 Unspecified atrial flutter: Secondary | ICD-10-CM | POA: Diagnosis not present

## 2016-07-16 DIAGNOSIS — Z5181 Encounter for therapeutic drug level monitoring: Secondary | ICD-10-CM | POA: Diagnosis not present

## 2016-07-16 LAB — POCT INR: INR: 1.5

## 2016-07-16 NOTE — Telephone Encounter (Signed)
Please advise.//AB/CMA 

## 2016-07-16 NOTE — Telephone Encounter (Signed)
Caller name: Ardelia Mems Relationship to patient: [place] Can be reached: [phone] Pharmacy:  Reason for call: Request call back because patient informed her that Dr. Larose Kells told her not to go up and down stairs and she needs to verify that this is true.

## 2016-07-17 NOTE — Telephone Encounter (Signed)
Called and spoke with [place] with [facility] and informed her of the message below from Dr. Larose Kells.  She verbalized understanding and agreed.  She stated that she wants to go ahead and get started with the stairs with her.//AB/CMA

## 2016-07-17 NOTE — Telephone Encounter (Signed)
Since the last time I saw her 05/28/2016 she had a pelvic fracture, I don't recall mentioned that specifically, maybe was her orthopedic doctor or PT.

## 2016-07-17 NOTE — Telephone Encounter (Addendum)
[name] [name]  [facility]) returning call [phone]

## 2016-07-17 NOTE — Telephone Encounter (Signed)
Called and [facility] @ 3:02pm @ [phone]) asking for RTC regarding message below.//AB/CMA

## 2016-07-21 ENCOUNTER — Encounter: Payer: Self-pay | Admitting: Podiatry

## 2016-07-21 ENCOUNTER — Telehealth: Payer: Self-pay | Admitting: Internal Medicine

## 2016-07-21 ENCOUNTER — Ambulatory Visit (INDEPENDENT_AMBULATORY_CARE_PROVIDER_SITE_OTHER): Payer: Medicare Other | Admitting: Podiatry

## 2016-07-21 DIAGNOSIS — L97409 Non-pressure chronic ulcer of unspecified heel and midfoot with unspecified severity: Secondary | ICD-10-CM

## 2016-07-21 DIAGNOSIS — L84 Corns and callosities: Secondary | ICD-10-CM | POA: Diagnosis not present

## 2016-07-21 DIAGNOSIS — I2510 Atherosclerotic heart disease of native coronary artery without angina pectoris: Secondary | ICD-10-CM | POA: Diagnosis not present

## 2016-07-21 NOTE — Telephone Encounter (Signed)
Do you know anything about wound?

## 2016-07-21 NOTE — Telephone Encounter (Signed)
I haven't seen her lately, please clarify her [name] [name]. We need to known what kind of wound she has, is she getting better, needs to be seen?

## 2016-07-21 NOTE — Progress Notes (Signed)
   Subjective:    Patient ID: [name] [name], female    DOB: 1940-03-22, 76 y.o.   MRN: [id number]  HPI  76 year old female presents the office today for concerns of "spots" the back of both of her heels. These have been ongoing for about a month. She states that she has had numerous injuries/falls and has been in and out of rehab. She has not been ambulatory and spends most of the day in the bed or chair. She denies any pain to the heels and denies any drainage, swelling, redness, red streaks. She states they started as a bruise. She has no other concerns at this time.   Review of Systems  All other systems reviewed and are negative.      Objective:   Physical Exam General: AAO x3, NAD  Dermatological: On the posterior aspect of bilateral heels is what appears to be old hemorraghic blisters that are starting to callus over. There is no fluctuance or crepitance and no fluid is present. There is no surrounding erythema, ascending cellulitis or clinical signs of infection. There are no other open lesions or pre-ulcerative lesions.  Vascular: Dorsalis Pedis artery and Posterior Tibial artery pedal pulses are 2/4 bilateral with immedate capillary fill time.  There is no pain with calf compression, swelling, warmth, erythema.   Neruologic: Grossly intact via light touch bilateral. Vibratory intact via tuning fork bilateral. Protective threshold with Semmes Wienstein monofilament intact to all pedal sites bilateral.   Musculoskeletal: No gross boney pedal deformities bilateral. No pain, crepitus, or limitation noted with foot and ankle range of motion bilateral.   Gait: presents in wheelchair      Assessment & Plan:  76 year old female with pre-ulcerative calluses/pullrs bilateral posterior heel -Treatment options discussed including all alternatives, risks, and complications -Etiology of symptoms were discussed -I believe these ae from pressure. Recommended heel pillow to offload.  Unfortunately to have any in and I recommended a prescription but he states he wants to come back next week to get them. For now float the heels with the pillow.  -Small amount of betadine to the areas daily -Monitor for any clinical signs or symptoms of infection and directed to call the office immediately should any occur or go to the ER.  Celesta Gentile, DPM

## 2016-07-21 NOTE — Telephone Encounter (Signed)
Caller name:  Anderson Malta Relation to pt: RN from [facility]  Call back number: [phone]    Reason for call:  Requesting verbal orders to clean wound with saline, please advise

## 2016-07-22 ENCOUNTER — Telehealth: Payer: Self-pay | Admitting: Podiatry

## 2016-07-22 ENCOUNTER — Telehealth: Payer: Self-pay

## 2016-07-22 NOTE — Telephone Encounter (Signed)
There is no wound. Apply a small amount of betadine to the areas on bilateral posterior heel to help dry the area and keep clean. Strict offloading at all times. The patients husband is to come by the [facility] office next week to get heel pillows.

## 2016-07-22 NOTE — Telephone Encounter (Signed)
Received Plan of Care from [facility], forms reviewed, signed and faxed to [phone]. Forms sent for scanning.

## 2016-07-22 NOTE — Telephone Encounter (Addendum)
I informed Garvin, that I did not have new orders, but would request from Dr. Jacqualyn Posey and call again. I informed [place] of Dr. Leigh Aurora strict offloading and betadine to both posterior heels, heel pillows. Anderson Malta asked if pt is to continue PT from another doctor at this time.

## 2016-07-22 NOTE — Telephone Encounter (Signed)
Spoke w/ Anderson Malta, she was calling in regards to Pt's feet, requesting orders for saline cleanse and vaseline gauze. Per chart Pt saw Dr. Jacqualyn Posey at [facility] yesterday- number to [place] office given (OV notes not completed), she will call their office for plan.

## 2016-07-22 NOTE — Telephone Encounter (Signed)
[name] [name] with [facility] called wanting to know if any wound care orders were prescribed by Dr. Jacqualyn Posey yesterday Tuesday 22 May. Requested a call back at [phone].

## 2016-07-23 ENCOUNTER — Ambulatory Visit (INDEPENDENT_AMBULATORY_CARE_PROVIDER_SITE_OTHER): Payer: Medicare Other | Admitting: Internal Medicine

## 2016-07-23 DIAGNOSIS — I482 Chronic atrial fibrillation, unspecified: Secondary | ICD-10-CM

## 2016-07-23 DIAGNOSIS — Z5181 Encounter for therapeutic drug level monitoring: Secondary | ICD-10-CM

## 2016-07-23 LAB — POCT INR: INR: 2.8

## 2016-07-23 NOTE — Telephone Encounter (Signed)
She is fine to weightbear from my standpoint. She can continue with all the other orders from other physicians.

## 2016-07-23 NOTE — Telephone Encounter (Signed)
Called and informed pt she could continue with her weight bearing PT

## 2016-07-24 ENCOUNTER — Encounter: Payer: Self-pay | Admitting: Endocrinology

## 2016-07-24 ENCOUNTER — Ambulatory Visit (INDEPENDENT_AMBULATORY_CARE_PROVIDER_SITE_OTHER): Payer: Medicare Other | Admitting: Endocrinology

## 2016-07-24 DIAGNOSIS — I2510 Atherosclerotic heart disease of native coronary artery without angina pectoris: Secondary | ICD-10-CM | POA: Diagnosis not present

## 2016-07-24 DIAGNOSIS — L97521 Non-pressure chronic ulcer of other part of left foot limited to breakdown of skin: Secondary | ICD-10-CM | POA: Diagnosis not present

## 2016-07-24 DIAGNOSIS — L97509 Non-pressure chronic ulcer of other part of unspecified foot with unspecified severity: Secondary | ICD-10-CM | POA: Insufficient documentation

## 2016-07-24 MED ORDER — INSULIN NPH ISOPHANE & REGULAR (70-30) 100 UNIT/ML ~~LOC~~ SUSP: SUBCUTANEOUS | 11 refills | Status: DC

## 2016-07-24 NOTE — Progress Notes (Signed)
Subjective:    Patient ID: [name] [name], female    DOB: [birth year], 76 y.o.   MRN: [id number]  HPI Pt returns for f/u of diabetes mellitus: DM type: Insulin-requiring type 2 Dx'ed: 9470 Complications: polyneuropathy, CAD, and retinopathy.  Therapy: insulin since soon after dx.  GDM: never.  DKA: never.  Severe hypoglycemia: last episode was in 2016.  Pancreatitis: never.   Other: she is on BID premixed insulin, due to noncompliance with multiple daily injections.  She takes human insulin, due to cost.  Interval history: no cbg record, but states cbg's vary from 150-300. There is no trend throughout the day. She has a left foot ulcer.  She says [facility] nurse told her to clean with betadine, and keep uncovered.  She takes 25 units each morning, and 10 units with supper.   Past Medical History:  Diagnosis Date  . Atrial fibrillation ([place])    SVT dx 2007, cath 2007 mild CAD, then had an cardioversion, ablation; still on coumadin , has occ palpitation, EKG 03-2010 NSR  . Blindness of left eye   . CKD (chronic kidney disease), stage III 06/16/2016   [name] 06/17/2016  . DIABETES MELLITUS, TYPE II 03/27/2006   dr [name]  . Eye muscle weakness    Right eye weakness after cataract surgery  . GERD (gastroesophageal reflux disease) 10/05/2011  . 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 HYPERLIPIDEMIA 03/27/2006  . HYPERTENSION 03/27/2006  . Intertrochanteric fracture of right hip ([place]) 07/13/2012  . 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 Osteopenia 2004   Dexa 2004 showed Osteopenia, DEXA 03/2007 normal  . Osteopenia   . Other chronic cystitis with hematuria   . Pelvic fracture ([place]) 06/16/2016   S/P fall  . Recurrent urinary tract infection    Seeing Urology  . 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 Seizures ([place])   . 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[place]) 9/62/8366    Past Surgical History:  Procedure Laterality Date  . ANKLE FRACTURE SURGERY  Bilateral 08/2015   "right one was in 2 places"  . CARDIAC CATHETERIZATION N/A 05/17/2015   Procedure: Left Heart Cath and Coronary Angiography;  Surgeon: Jettie Booze, MD;  Location: [facility] CV [facility];  Service: Cardiovascular;  Laterality: N/A;  . CARDIAC CATHETERIZATION N/A 05/17/2015   Procedure: Coronary Balloon Angioplasty;  Surgeon: Jettie Booze, MD;  Location: [facility];  Service: Cardiovascular;  Laterality: N/A;  . FEMUR IM NAIL Right 07/15/2012   Procedure: INTRAMEDULLARY (IM) NAIL HIP;  Surgeon: Johnny Bridge, MD;  Location: [facility];  Service: Orthopedics;  Laterality: Right;  . FEMUR IM NAIL Left 12/02/2015   Procedure: INTRAMEDULLARY (IM) NAIL FEMORAL;  Surgeon: [name], MD;  Location: [facility];  Service: Orthopedics;  Laterality: Left;  . FRACTURE SURGERY    . INCISION AND DRAINAGE HIP Left 01/03/2016   Procedure: IRRIGATION AND DEBRIDEMENT HIP;  Surgeon: [name], MD;  Location: [facility];  Service: Orthopedics;  Laterality: Left;  . SHOULDER SURGERY Bilateral    "don't remember which side"    Social History   Social History  . Marital status: Married    Spouse name: Jenny Reichmann  . Number of children: 1  . Years of education: College   Occupational History  . retired  Retired   Social History Main Topics  . Smoking status: Former Smoker    Types: Cigarettes    Quit  date: 03/02/1978  . Smokeless tobacco: Never Used  . Alcohol use No  . Drug use: No  . Sexual activity: No   Other Topics Concern  . Not on file   Social History Narrative   Patient lives at home spouse. Uses a walker consistently.   Moved from [place] 2004   1 son, problems w/ drugs, passed away 03/06/2016-- OD        Current Outpatient Prescriptions on File Prior to Visit  Medication Sig Dispense Refill  . atorvastatin (LIPITOR) 80 MG tablet Take 1 tablet (80 mg total) by mouth daily. 30 tablet 5  . calcium-vitamin D (OSCAL WITH D) 500-200 MG-UNIT per tablet Take 1 tablet by mouth  daily.    . clopidogrel (PLAVIX) 75 MG tablet Take 1 tablet (75 mg total) by mouth daily. 30 tablet 5  . escitalopram (LEXAPRO) 10 MG tablet Take 20 mg by mouth daily.    . furosemide (LASIX) 40 MG tablet Take 1 tablet (40 mg total) by mouth 2 (two) times daily. (Patient taking differently: Take 40 mg by mouth 2 (two) times daily as needed for fluid or edema. ) 60 tablet 5  . HYDROcodone-acetaminophen (NORCO/VICODIN) 5-325 MG tablet Take 1-2 tablets by mouth every 6 (six) hours as needed for moderate pain or severe pain. 20 tablet 0  . metoprolol succinate (TOPROL-XL) 50 MG 24 hr tablet Take 25 mg by mouth daily. Take with or immediately following a meal.    . Multiple Vitamins-Minerals (MULTIVITAMIN ADULT PO) Take 1 tablet by mouth daily with breakfast.     . nitrofurantoin, macrocrystal-monohydrate, (MACROBID) 100 MG capsule Take 1 capsule (100 mg total) by mouth every 12 (twelve) hours. 6 capsule 0  . pantoprazole (PROTONIX) 40 MG tablet Take 1 tablet (40 mg total) by mouth daily. 90 tablet 3  . promethazine (PHENERGAN) 12.5 MG tablet Take 1 tablet (12.5 mg total) by mouth every 6 (six) hours as needed for nausea or vomiting. 30 tablet 1  . triamcinolone ointment (KENALOG) 0.5 % Apply 1 application topically 4 (four) times daily. As needed for itching 30 g 0  . warfarin (COUMADIN) 2.5 MG tablet TAKE ONE TABLET BY MOUTH ONCE DAILY (Patient taking differently: Take 5 mg by mouth in the morning on Sun/Tues/Thurs/Sat and 7.5 mg on Mon/Wed/Fri) 75 tablet 2   No current facility-administered medications on file prior to visit.     Allergies  Allergen Reactions  . Procaine Hcl Anaphylaxis  . Amoxicillin Itching    Family History  Problem Relation Age of Onset  . Diabetes Father   . Heart attack Father 33  . Diabetes Sister   . Drug abuse Son   . Cancer Neg Hx        no hx of colon or breast cancer    BP 132/86   Pulse 95   Ht 5\' 9"  (1.753 m)   Wt 190 lb (86.2 kg)   SpO2 96%   BMI  28.06 kg/m    Review of Systems Denies fever.    Objective:   Physical Exam VITAL SIGNS:  See vs page GENERAL: no distress Pulses: dorsalis pedis intact bilat.   MSK: no deformity of the feet CV: trace bilat leg edema.   Skin:  There is a 2x1 cm ulcer at the posterior aspect of the left foot.  normal color and temp on the feet.  Neuro: sensation is intact to touch on the feet. Ext: There is bilateral onychomycosis of the toenails.  Lab Results  Component Value Date   HGBA1C 8.6 (H) 06/17/2016      Assessment & Plan:  Foot ulcer, new Insulin-requiring type 2 DM, with DR: worse  Patient Instructions  Please see a wound specialist.  you will receive a phone call, about a day and time for an appointment. Please increase the insulin to 26 units with breakfast, and 11 units with supper. Please come back for a follow-up appointment in 3 months check your blood sugar twice a day.  vary the time of day when you check, between before the 3 meals, and at bedtime.  also check if you have symptoms of your blood sugar being too high or too low.  please keep a record of the readings and bring it to your next appointment here.  please call us sooner if your blood sugar goes below 70, or if you have a lot of readings over 200.

## 2016-07-24 NOTE — Patient Instructions (Addendum)
Please see a wound specialist.  you will receive a phone call, about a day and time for an appointment. Please increase the insulin to 26 units with breakfast, and 11 units with supper. Please come back for a follow-up appointment in 3 months check your blood sugar twice a day.  vary the time of day when you check, between before the 3 meals, and at bedtime.  also check if you have symptoms of your blood sugar being too high or too low.  please keep a record of the readings and bring it to your next appointment here.  please call us sooner if your blood sugar goes below 70, or if you have a lot of readings over 200.

## 2016-07-28 ENCOUNTER — Telehealth (INDEPENDENT_AMBULATORY_CARE_PROVIDER_SITE_OTHER): Payer: Self-pay | Admitting: Orthopaedic Surgery

## 2016-07-28 ENCOUNTER — Telehealth: Payer: Self-pay | Admitting: Internal Medicine

## 2016-07-28 MED ORDER — HYDROCODONE-ACETAMINOPHEN 5-325 MG PO TABS: 1.0000 | ORAL_TABLET | Freq: Three times a day (TID) | ORAL | 0 refills | Status: DC | PRN

## 2016-07-28 NOTE — Telephone Encounter (Signed)
Please advise 

## 2016-07-28 NOTE — Telephone Encounter (Signed)
Caller name: Ardelia Mems Relationship to patient: [place] Can be reached: [phone] Pharmacy:  Reason for call: Request to know if patient can take Tylenol for her pain. Patient had a Rx for Oxycodone that was given to her at the hospital but she has ran out and she does not go to the Orthopedic doctor until next month. Plse adv

## 2016-07-28 NOTE — Telephone Encounter (Signed)
No oxycodone, I provided hydrocodone, prescription given to her husband who is here today for a visit

## 2016-07-28 NOTE — Telephone Encounter (Signed)
error 

## 2016-07-28 NOTE — Telephone Encounter (Signed)
Spoke w/ Ardelia Mems, informed Hydrocodone Rx given to Pt's husband Jenny Reichmann. [place] verbalized understanding.

## 2016-07-31 NOTE — Telephone Encounter (Signed)
Can you please make pt appt for Monday? Thanks

## 2016-07-31 NOTE — Telephone Encounter (Signed)
Patient husband came in today wanting to know if there were any earlier appointments for patient.  Advised patient husband to keep calling to see if someone may cancel.  He would like to know what to do until her appointment or could patient get something for pain.  CB# is [phone] or [phone].

## 2016-08-03 ENCOUNTER — Ambulatory Visit (INDEPENDENT_AMBULATORY_CARE_PROVIDER_SITE_OTHER): Payer: Medicare Other

## 2016-08-03 ENCOUNTER — Ambulatory Visit (INDEPENDENT_AMBULATORY_CARE_PROVIDER_SITE_OTHER): Payer: Medicare Other | Admitting: Orthopaedic Surgery

## 2016-08-03 ENCOUNTER — Telehealth: Payer: Self-pay | Admitting: Endocrinology

## 2016-08-03 ENCOUNTER — Encounter (INDEPENDENT_AMBULATORY_CARE_PROVIDER_SITE_OTHER): Payer: Self-pay | Admitting: Orthopaedic Surgery

## 2016-08-03 VITALS — BP 133/68 | HR 73 | Ht 69.0 in | Wt 190.0 lb

## 2016-08-03 DIAGNOSIS — S32592D Other specified fracture of left pubis, subsequent encounter for fracture with routine healing: Secondary | ICD-10-CM

## 2016-08-03 DIAGNOSIS — S32591D Other specified fracture of right pubis, subsequent encounter for fracture with routine healing: Secondary | ICD-10-CM

## 2016-08-03 DIAGNOSIS — I2510 Atherosclerotic heart disease of native coronary artery without angina pectoris: Secondary | ICD-10-CM | POA: Diagnosis not present

## 2016-08-03 DIAGNOSIS — M25551 Pain in right hip: Secondary | ICD-10-CM

## 2016-08-03 NOTE — Telephone Encounter (Signed)
Patient's husband called to get the name of the provider for the wound care for the patient. No further action needed.

## 2016-08-03 NOTE — Progress Notes (Signed)
Office Visit Note   Patient: [name] [name]           Date of Birth: 1940/05/12           MRN: [id number] Visit Date: 08/03/2016              Requested by: Colon Branch, MD [address] [place], [place] [address] PCP: Colon Branch, MD   Assessment & Plan: Visit Diagnoses:  1. Pain in right hip   2. Closed bilateral fracture of pubic rami with routine healing, subsequent encounter          Right superior and inferior pubic rami fracture, right sacral fracture. Left pubic rami are intact.  Plan: Home health physical therapy repeat referral she needs to work on using her walker was standing and try to increase her strength and try to work on taking a few steps. She has a folding walker at home without wheels. She'll be weightbearing as tolerated with her walker. Recheck 6 weeks. Follow-Up Instructions: Return in about 6 weeks (around 09/14/2016).   Orders:  Orders Placed This Encounter  Procedures  . XR HIP UNILAT W OR W/O PELVIS 2-3 VIEWS RIGHT   No orders of the defined types were placed in this encounter.     Procedures: No procedures performed   Clinical Data: No additional findings.   Subjective: Chief Complaint  Patient presents with  . Right Hip - Pain    HPI 76 year old female had a fall with pelvic fracture for 1618. I previously treated her for intertrochanteric fractures right and left which had healed. CT scan showed a sacral fracture on the right as well as rami fracture on the right. She is doing well with therapy at her house after a few weeks and then this had declined more pain and not been able to walk can only stand. No associated bowel bladder symptoms. Pain is in the right groin. She is postop follow-up after 06/15/2016 fracture but her husband is in the hospital and she did not have transportation. She has not had any falls since 06/15/2016.  Review of Systems 14 point review of systems updated of note is history of ankle fracture, hip  fractures, chronic to him with atrial fib. Obesity, osteoporosis. Otherwise negative as it pertains to history of present illness   Objective: Vital Signs: BP 133/68   Pulse 73   Ht 5\' 9"  (1.753 m)   Wt 190 lb (86.2 kg)   BMI 28.06 kg/m   Physical Exam  Constitutional: She is oriented to person, place, and time. She appears well-developed.  HENT:  Head: Normocephalic.  Right Ear: External ear normal.  Left Ear: External ear normal.  Eyes: Pupils are equal, round, and reactive to light.  Neck: No tracheal deviation present. No thyromegaly present.  Cardiovascular: Normal rate.   Pulmonary/Chest: Effort normal.  Abdominal: Soft.  Musculoskeletal:  Patient's in a wheelchair she complains of pain with internal rotation of her hip but internally Rotates 30 with resistance against either motion. Negative straight leg raising 90. Anterior tib EHL is intact. Pain with attempted straight leg raising actively with groin pain. She has palpable posterior tibial pulses.  Neurological: She is alert and oriented to person, place, and time.  Skin: Skin is warm and dry.  Psychiatric: She has a normal mood and affect. Her behavior is normal.    Ortho Exam  Specialty Comments:  No specialty comments available.  Imaging: Xr Hip Unilat W Or  W/o Pelvis 2-3 Views Right  Result Date: 08/03/2016 AP frog leg x-ray right hip and inferior pelvis x-ray demonstrates right superior and inferior rami fracture with interval healing. Healed intertrochanteric fracture without the hip osteoarthritis. Good position of hardware. Impression: Previous rami fractures on the right with the interval healing. Sacral fracture shows interval healing without vertical translation.    PMFS History: Patient Active Problem List   Diagnosis Date Noted  . Foot ulcer ([place]) 07/24/2016  . Pelvic fracture ([place]) 06/15/2016  . Closed fracture of ramus of right pubis ([place])   . Anemia due to chronic kidney disease   . Pruritus  02/18/2016  . Decreased strength of lower extremity   . Gram-negative infection   . CKD (chronic kidney disease) stage 3, GFR 30-59 ml/min   . Postoperative anemia due to acute blood loss 01/04/2016  . Left hip postoperative wound infection 01/02/2016  . Closed intertrochanteric fracture of hip, left, sequela 12/25/2015  . Anemia 12/01/2015  . Ankle fracture, left 09/17/2015  . Ankle fracture, bimalleolar, closed   . Bilateral fibular fractures 09/14/2015  . Hypertensive heart disease 05/20/2015  . CAD (coronary artery disease) 05/20/2015  . Acute systolic CHF (congestive heart failure) ([place]) 05/20/2015  . Anticoagulated on Coumadin 05/20/2015  . Type 2 diabetes mellitus with vascular disease ([facility]) 05/17/2015  . NSTEMI (non-ST elevated myocardial infarction) ([place])   . Bacteremia due to Escherichia coli 05/11/2015  . Acute kidney injury superimposed on chronic kidney disease ([facility]) 05/11/2015  . Uncontrolled hypertension 05/11/2015  . 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[place]) 95/18/8416  . PCP NOTES >>>>>>>>>>>>>>>> 12/28/2014  . Depression 05/17/2014  . Memory loss 05/17/2014  . Anxiety and depression, PCP notes  11/24/2013  . Severe obesity (BMI >= 40) ([place]) 07/14/2013  . Encounter for therapeutic drug monitoring 06/02/2013  . Dilantin toxicity 11/21/2012  . History of encephalitis 11/18/2012  . Localization-related (focal) (partial) epilepsy and epileptic syndromes with complex partial seizures, without mention of intractable epilepsy 09/27/2012  . Intertrochanteric fracture of right hip ([place]) 07/13/2012  . DM (diabetes mellitus) ([place]) 01/19/2012  . GERD (gastroesophageal reflux disease) 10/05/2011  . Shoulder pain 08/27/2011  . Seizure disorder ([place]) 05/24/2011  . Annual physical exam 02/10/2011  . Atrial fibrillation ([place])   . 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 Hyperlipidemia 03/27/2006  . Hereditary and idiopathic peripheral neuropathy 03/27/2006  . 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 Essential  hypertension 03/27/2006  . OSTEOPENIA 03/27/2006   Past Medical History:  Diagnosis Date  . Atrial fibrillation ([facility])    SVT dx 2007, cath 2007 mild CAD, then had an cardioversion, ablation; still on coumadin , has occ palpitation, EKG 03-2010 NSR  . Blindness of left eye   . CKD (chronic kidney disease), stage III 06/16/2016   [name] 06/17/2016  . DIABETES MELLITUS, TYPE II 03/27/2006   dr [name]  . Eye muscle weakness    Right eye weakness after cataract surgery  . GERD (gastroesophageal reflux disease) 10/05/2011  . 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 HYPERLIPIDEMIA 03/27/2006  . HYPERTENSION 03/27/2006  . Intertrochanteric fracture of right hip ([place]) 07/13/2012  . 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 Osteopenia 2004   Dexa 2004 showed Osteopenia, DEXA 03/2007 normal  . Osteopenia   . Other chronic cystitis with hematuria   . Pelvic fracture ([facility]) 06/16/2016   S/P fall  . Recurrent urinary tract infection    Seeing Urology  . RETINOPATHY,  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  . Seizures ([place])   . 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[place]) 1/75/1025    Family History  Problem Relation Age of Onset  . Diabetes Father   . Heart attack Father 61  . Diabetes Sister   . Drug abuse Son   . Cancer Neg Hx        no hx of colon or breast cancer    Past Surgical History:  Procedure Laterality Date  . ANKLE FRACTURE SURGERY Bilateral 08/2015   "right one was in 2 places"  . CARDIAC CATHETERIZATION N/A 05/17/2015   Procedure: Left Heart Cath and Coronary Angiography;  Surgeon: Jettie Booze, MD;  Location: [facility];  Service: Cardiovascular;  Laterality: N/A;  . CARDIAC CATHETERIZATION N/A 05/17/2015   Procedure: Coronary Balloon Angioplasty;  Surgeon: Jettie Booze, MD;  Location: [facility];  Service: Cardiovascular;  Laterality: N/A;  . FEMUR IM NAIL Right 07/15/2012   Procedure: INTRAMEDULLARY (IM) NAIL HIP;  Surgeon: Johnny Bridge, MD;  Location: [facility];  Service: Orthopedics;  Laterality: Right;  . FEMUR IM NAIL Left 12/02/2015   Procedure: INTRAMEDULLARY (IM) NAIL FEMORAL;  Surgeon: [name], MD;  Location: [facility];  Service: Orthopedics;  Laterality: Left;  . FRACTURE SURGERY    . INCISION AND DRAINAGE HIP Left 01/03/2016   Procedure: IRRIGATION AND DEBRIDEMENT HIP;  Surgeon: [name], MD;  Location: [facility];  Service: Orthopedics;  Laterality: Left;  . SHOULDER SURGERY Bilateral    "don't remember which side"   Social History   Occupational History  . retired  Retired   Social History Main Topics  . Smoking status: Former Smoker    Types: Cigarettes    Quit date: 03/02/1978  . Smokeless tobacco: Never Used  . Alcohol use No  . Drug use: No  . Sexual activity: No

## 2016-08-04 ENCOUNTER — Ambulatory Visit (INDEPENDENT_AMBULATORY_CARE_PROVIDER_SITE_OTHER): Payer: Medicare Other | Admitting: Orthopaedic Surgery

## 2016-08-04 ENCOUNTER — Ambulatory Visit: Payer: Medicare Other | Admitting: Podiatry

## 2016-08-06 ENCOUNTER — Ambulatory Visit (INDEPENDENT_AMBULATORY_CARE_PROVIDER_SITE_OTHER): Payer: Medicare Other | Admitting: *Deleted

## 2016-08-06 DIAGNOSIS — I482 Chronic atrial fibrillation, unspecified: Secondary | ICD-10-CM

## 2016-08-06 DIAGNOSIS — I2510 Atherosclerotic heart disease of native coronary artery without angina pectoris: Secondary | ICD-10-CM

## 2016-08-06 DIAGNOSIS — Z5181 Encounter for therapeutic drug level monitoring: Secondary | ICD-10-CM | POA: Diagnosis not present

## 2016-08-06 LAB — POCT INR: INR: 2.4

## 2016-08-07 ENCOUNTER — Encounter (HOSPITAL_BASED_OUTPATIENT_CLINIC_OR_DEPARTMENT_OTHER): Payer: Medicare Other | Attending: Internal Medicine

## 2016-08-07 DIAGNOSIS — E1140 Type 2 diabetes mellitus with diabetic neuropathy, unspecified: Secondary | ICD-10-CM | POA: Insufficient documentation

## 2016-08-07 DIAGNOSIS — E11319 Type 2 diabetes mellitus with unspecified diabetic retinopathy without macular edema: Secondary | ICD-10-CM | POA: Insufficient documentation

## 2016-08-07 DIAGNOSIS — H5462 Unqualified visual loss, left eye, normal vision right eye: Secondary | ICD-10-CM | POA: Insufficient documentation

## 2016-08-07 DIAGNOSIS — L89610 Pressure ulcer of right heel, unstageable: Secondary | ICD-10-CM | POA: Insufficient documentation

## 2016-08-07 DIAGNOSIS — L97422 Non-pressure chronic ulcer of left heel and midfoot with fat layer exposed: Secondary | ICD-10-CM | POA: Insufficient documentation

## 2016-08-07 DIAGNOSIS — N183 Chronic kidney disease, stage 3 (moderate): Secondary | ICD-10-CM | POA: Insufficient documentation

## 2016-08-07 DIAGNOSIS — E11621 Type 2 diabetes mellitus with foot ulcer: Secondary | ICD-10-CM | POA: Insufficient documentation

## 2016-08-07 DIAGNOSIS — I252 Old myocardial infarction: Secondary | ICD-10-CM | POA: Insufficient documentation

## 2016-08-07 DIAGNOSIS — E1122 Type 2 diabetes mellitus with diabetic chronic kidney disease: Secondary | ICD-10-CM | POA: Insufficient documentation

## 2016-08-07 DIAGNOSIS — Z955 Presence of coronary angioplasty implant and graft: Secondary | ICD-10-CM | POA: Insufficient documentation

## 2016-08-07 DIAGNOSIS — D631 Anemia in chronic kidney disease: Secondary | ICD-10-CM | POA: Insufficient documentation

## 2016-08-07 DIAGNOSIS — Z87891 Personal history of nicotine dependence: Secondary | ICD-10-CM | POA: Insufficient documentation

## 2016-08-07 DIAGNOSIS — I2510 Atherosclerotic heart disease of native coronary artery without angina pectoris: Secondary | ICD-10-CM | POA: Insufficient documentation

## 2016-08-07 DIAGNOSIS — I129 Hypertensive chronic kidney disease with stage 1 through stage 4 chronic kidney disease, or unspecified chronic kidney disease: Secondary | ICD-10-CM | POA: Insufficient documentation

## 2016-08-07 DIAGNOSIS — L97412 Non-pressure chronic ulcer of right heel and midfoot with fat layer exposed: Secondary | ICD-10-CM | POA: Insufficient documentation

## 2016-08-07 DIAGNOSIS — L89620 Pressure ulcer of left heel, unstageable: Secondary | ICD-10-CM | POA: Insufficient documentation

## 2016-08-10 ENCOUNTER — Telehealth: Payer: Self-pay | Admitting: Internal Medicine

## 2016-08-10 ENCOUNTER — Telehealth: Payer: Self-pay | Admitting: *Deleted

## 2016-08-10 NOTE — Telephone Encounter (Signed)
Spoke w/ Ritu, verbal orders given.

## 2016-08-10 NOTE — Telephone Encounter (Signed)
Caller name: RITU  Relation to pt: OT from [facility]  Call back number: [phone]    Reason for call:  Requesting verbal orders for OT for 1x 2

## 2016-08-10 NOTE — Telephone Encounter (Signed)
Caller name:  Ardelia Mems  Relation to pt: PT from [facility]  Call back number: [phone] Pharmacy:  Reason for call:  Physical Therapy  LVM 5:36pm 08/10/16 stating patient had a fall on Friday on her way home from a Dr. Appointment, patient did verbalize she was ok.

## 2016-08-10 NOTE — Telephone Encounter (Signed)
Zihlman states pt's husband informed her today, they were to have a nurse change the dressings, and the only orders they have is apply betadine to the areas. I told Anderson Malta we had not seen her since 07/21/2016 and the wound care had not changed, we would see her tomorrow and I had written a note to Dr. Jacqualyn Posey to send wound care orders to me.

## 2016-08-11 ENCOUNTER — Encounter: Payer: Self-pay | Admitting: Internal Medicine

## 2016-08-11 ENCOUNTER — Encounter: Payer: Self-pay | Admitting: Podiatry

## 2016-08-11 ENCOUNTER — Ambulatory Visit (INDEPENDENT_AMBULATORY_CARE_PROVIDER_SITE_OTHER): Payer: Medicare Other | Admitting: Podiatry

## 2016-08-11 ENCOUNTER — Ambulatory Visit (INDEPENDENT_AMBULATORY_CARE_PROVIDER_SITE_OTHER): Payer: Medicare Other | Admitting: Internal Medicine

## 2016-08-11 VITALS — BP 142/70 | HR 96 | Temp 97.6°F | Resp 14 | Ht 69.0 in | Wt 191.4 lb

## 2016-08-11 DIAGNOSIS — I2510 Atherosclerotic heart disease of native coronary artery without angina pectoris: Secondary | ICD-10-CM

## 2016-08-11 DIAGNOSIS — L84 Corns and callosities: Secondary | ICD-10-CM

## 2016-08-11 DIAGNOSIS — L97409 Non-pressure chronic ulcer of unspecified heel and midfoot with unspecified severity: Secondary | ICD-10-CM

## 2016-08-11 DIAGNOSIS — R251 Tremor, unspecified: Secondary | ICD-10-CM

## 2016-08-11 DIAGNOSIS — M79674 Pain in right toe(s): Secondary | ICD-10-CM | POA: Diagnosis not present

## 2016-08-11 DIAGNOSIS — E785 Hyperlipidemia, unspecified: Secondary | ICD-10-CM | POA: Diagnosis not present

## 2016-08-11 DIAGNOSIS — S32591D Other specified fracture of right pubis, subsequent encounter for fracture with routine healing: Secondary | ICD-10-CM | POA: Diagnosis not present

## 2016-08-11 DIAGNOSIS — M8000XD Age-related osteoporosis with current pathological fracture, unspecified site, subsequent encounter for fracture with routine healing: Secondary | ICD-10-CM

## 2016-08-11 DIAGNOSIS — B351 Tinea unguium: Secondary | ICD-10-CM | POA: Diagnosis not present

## 2016-08-11 DIAGNOSIS — I10 Essential (primary) hypertension: Secondary | ICD-10-CM

## 2016-08-11 DIAGNOSIS — Z780 Asymptomatic menopausal state: Secondary | ICD-10-CM

## 2016-08-11 DIAGNOSIS — M79675 Pain in left toe(s): Secondary | ICD-10-CM | POA: Diagnosis not present

## 2016-08-11 LAB — COMPREHENSIVE METABOLIC PANEL
ALBUMIN: 3.6 g/dL (ref 3.5–5.2)
ALK PHOS: 220 U/L — AB (ref 39–117)
ALT: 15 U/L (ref 0–35)
AST: 20 U/L (ref 0–37)
BUN: 36 mg/dL — AB (ref 6–23)
CALCIUM: 9.4 mg/dL (ref 8.4–10.5)
CHLORIDE: 99 meq/L (ref 96–112)
CO2: 32 mEq/L (ref 19–32)
CREATININE: 1.69 mg/dL — AB (ref 0.40–1.20)
GFR: 31.38 mL/min — ABNORMAL LOW (ref >60.00)
Glucose, Bld: 176 mg/dL — ABNORMAL HIGH (ref 70–99)
Potassium: 4.1 mEq/L (ref 3.5–5.1)
SODIUM: 136 meq/L (ref 135–145)
TOTAL PROTEIN: 7.4 g/dL (ref 6.0–8.3)
Total Bilirubin: 0.4 mg/dL (ref 0.2–1.2)

## 2016-08-11 LAB — LIPID PANEL
CHOLESTEROL: 141 mg/dL (ref 0–200)
HDL: 42.7 mg/dL (ref >39.00)
LDL CALC: 64 mg/dL (ref 0–99)
NonHDL: 98.22
TRIGLYCERIDES: 170 mg/dL — AB (ref 0.0–149.0)
Total CHOL/HDL Ratio: 3
VLDL: 34 mg/dL (ref 0.0–40.0)

## 2016-08-11 NOTE — Progress Notes (Signed)
Pre visit review using our clinic review tool, if applicable. No additional management support is needed unless otherwise documented below in the visit note. 

## 2016-08-11 NOTE — Progress Notes (Signed)
Subjective: Ms. Chavana presents the office they with her husband for follow-up evaluation of wounds to the posterior heels. When I last saw her she had what appeared to be dried blisters the back of her heel there were starting callus over. There were no open sore identified at that time. We did offloading heel pillows and Betadine to help dry the area. She followed up with Dr. [name] after I saw her who referred her to the wound care center. She followed up with Dr. Izola Price. Her husband states that the trimmed the lesions on the back of the heel. She has been having some pain to the back of the left heel, but not the right. She has a home nurse that comes to the house to change the bandage and Dr. Izola Price provided wound care orders. She denies any increase in redness or swelling or any drainage or pus. She has no pain to the right heel. This to the right side is doing well. Denies any systemic complaints such as fevers, chills, nausea, vomiting. No acute changes since last appointment, and no other complaints at this time.   Objective: AAO x3, NAD DP/PT pulses palpable bilaterally, CRT less than 3 seconds To the posterior aspects of bilateral heels are evidence of pressure. On the posterior right heel is evidence of a callus that has been trimmed but there is no open sore identified. The posterior left heels a small superficial area of skin breakdown there is no probing, undermining or tunneling. Is no swelling erythema, ascending synovitis. Does not options, crepitus, malodor. No open lesions or pre-ulcerative lesions.  Nails are also discolored, dystrophic and elongated and causing irritation in shoes. No cellulitis or drainage. No clinical signs of infection. No pain with calf compression, swelling, warmth, erythema  Assessment: Superficial wound left posterior heel free ulcerative lesion to the right posterior heel; onychomycosis  Plan: -All treatment options discussed with the patient  including all alternatives, risks, complications.  -Nails are sharply debrided 10 without complications or bleeding. -She has been going to the wound care center, I will defer further orders to Dr. Izola Price. Strict offloading to the heels daily. Monitor for any clinical signs or symptoms of infection and directed to call the office immediately should any occur or go to the ER. -RTC in 3 months for routine care or sooner if any issues are to arise.  -Patient encouraged to call the office with any questions, concerns, change in symptoms.   Celesta Gentile, DPM

## 2016-08-11 NOTE — Progress Notes (Signed)
Subjective:    Patient ID: [name] [name], female    DOB: 31-Aug-1940, 76 y.o.   MRN: [id number]  DOS:  08/11/2016 Type of visit - description :  Interval history:  Since the last time I saw her in March, she had a fall and admitted to hospital in April. Had a pelvic-- right sacral and inferior pubic  ram nondisplaced fractures  Notes from orthopedic surgery, endocrinology reviewed  Review of Systems  At this point she is feeling well, is back home. Denies anxiety or depression Recently developed a wound on one of the heels, she already saw the wound doctors. Doing physical therapy at home and wound care at home. Good medication compliance. Some tremor in the hands, concerned about it.  Past Medical History:  Diagnosis Date  . Atrial fibrillation ([place])    SVT dx 2007, cath 2007 mild CAD, then had an cardioversion, ablation; still on coumadin , has occ palpitation, EKG 03-2010 NSR  . Blindness of left eye   . CKD (chronic kidney disease), stage III 06/16/2016   [name] 06/17/2016  . DIABETES MELLITUS, TYPE II 03/27/2006   dr [name]  . Eye muscle weakness    Right eye weakness after cataract surgery  . GERD (gastroesophageal reflux disease) 10/05/2011  . 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 HYPERLIPIDEMIA 03/27/2006  . HYPERTENSION 03/27/2006  . Intertrochanteric fracture of right hip ([place]) 07/13/2012  . 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 Osteopenia 2004   Dexa 2004 showed Osteopenia, DEXA 03/2007 normal  . Osteopenia   . Other chronic cystitis with hematuria   . Pelvic fracture ([place]) 06/16/2016   S/P fall  . Recurrent urinary tract infection    Seeing Urology  . 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 Seizures ([place])   . 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[place]) 1/74/0814    Past Surgical History:  Procedure Laterality Date  . ANKLE FRACTURE SURGERY Bilateral 08/2015   "right one was in 2 places"  . CARDIAC CATHETERIZATION N/A  05/17/2015   Procedure: Left Heart Cath and Coronary Angiography;  Surgeon: Jettie Booze, MD;  Location: [facility];  Service: Cardiovascular;  Laterality: N/A;  . CARDIAC CATHETERIZATION N/A 05/17/2015   Procedure: Coronary Balloon Angioplasty;  Surgeon: Jettie Booze, MD;  Location: [facility];  Service: Cardiovascular;  Laterality: N/A;  . FEMUR IM NAIL Right 07/15/2012   Procedure: INTRAMEDULLARY (IM) NAIL HIP;  Surgeon: Johnny Bridge, MD;  Location: [facility];  Service: Orthopedics;  Laterality: Right;  . FEMUR IM NAIL Left 12/02/2015   Procedure: INTRAMEDULLARY (IM) NAIL FEMORAL;  Surgeon: [name], MD;  Location: [facility];  Service: Orthopedics;  Laterality: Left;  . FRACTURE SURGERY    . INCISION AND DRAINAGE HIP Left 01/03/2016   Procedure: IRRIGATION AND DEBRIDEMENT HIP;  Surgeon: [name], MD;  Location: [facility];  Service: Orthopedics;  Laterality: Left;  . SHOULDER SURGERY Bilateral    "don't remember which side"    Social History   Social History  . Marital status: Married    Spouse name: Jenny Reichmann  . Number of children: 1  . Years of education: College   Occupational History  . retired  Retired   Social History Main Topics  . Smoking status: Former Smoker    Types: Cigarettes    Quit date: 03/02/1978  . Smokeless tobacco: Never Used  . Alcohol use No  .  Drug use: No  . Sexual activity: No   Other Topics Concern  . Not on file   Social History Narrative   Patient lives at home spouse. Uses a walker consistently.   Moved from [place] 2004   1 son, problems w/ drugs, passed away 03/14/2016-- OD          Allergies as of 08/11/2016      Reactions   Procaine Hcl Anaphylaxis   Amoxicillin Itching      Medication List       Accurate as of 08/11/16 11:28 AM. Always use your most recent med list.          atorvastatin 80 MG tablet Commonly known as:  LIPITOR Take 1 tablet (80 mg total) by mouth daily.   calcium-vitamin D 500-200 MG-UNIT  tablet Commonly known as:  OSCAL WITH D Take 1 tablet by mouth daily.   clopidogrel 75 MG tablet Commonly known as:  PLAVIX Take 1 tablet (75 mg total) by mouth daily.   escitalopram 10 MG tablet Commonly known as:  LEXAPRO Take 20 mg by mouth daily.   furosemide 40 MG tablet Commonly known as:  LASIX Take 1 tablet (40 mg total) by mouth 2 (two) times daily.   HYDROcodone-acetaminophen 5-325 MG tablet Commonly known as:  NORCO/VICODIN Take 1-2 tablets by mouth 3 (three) times daily as needed for moderate pain or severe pain.   insulin NPH-regular Human (70-30) 100 UNIT/ML injection Commonly known as:  NOVOLIN 70/30 26 units with breakfast and 11 units with the evening meal   metoprolol succinate 50 MG 24 hr tablet Commonly known as:  TOPROL-XL Take 25 mg by mouth daily. Take with or immediately following a meal.   MULTIVITAMIN ADULT PO Take 1 tablet by mouth daily with breakfast.   nitrofurantoin (macrocrystal-monohydrate) 100 MG capsule Commonly known as:  MACROBID Take 1 capsule (100 mg total) by mouth every 12 (twelve) hours.   pantoprazole 40 MG tablet Commonly known as:  PROTONIX Take 1 tablet (40 mg total) by mouth daily.   promethazine 12.5 MG tablet Commonly known as:  PHENERGAN Take 1 tablet (12.5 mg total) by mouth every 6 (six) hours as needed for nausea or vomiting.   triamcinolone ointment 0.5 % Commonly known as:  KENALOG Apply 1 application topically 4 (four) times daily. As needed for itching   warfarin 2.5 MG tablet Commonly known as:  COUMADIN TAKE ONE TABLET BY MOUTH ONCE DAILY          Objective:   Physical Exam BP (!) 142/70 (BP Location: Left Arm, Patient Position: Sitting, Cuff Size: Small)   Pulse 96   Temp 97.6 F (36.4 C) (Oral)   Resp 14   Ht 5\' 9"  (1.753 m)   Wt 191 lb 6 oz (86.8 kg)   SpO2 96%   BMI 28.26 kg/m  General:   Well developed, well nourished . NAD.  HEENT:  Normocephalic . Face symmetric, atraumatic Lungs:    CTA B Normal respiratory effort, no intercostal retractions, no accessory muscle use. Heart: Irregularly irregular.  No pretibial edema bilaterally  Skin: Not pale. Not jaundice Neurologic:  alert & oriented X3.  Speech normal, gait not tested, CT now we'll check. Fine tremor at rest and action, hands only  Psych--  Cognition and judgment appear intact.  Cooperative with normal attention span and concentration.  Behavior appropriate. No anxious or depressed appearing.      Assessment & Plan:  Assessment   DM  +  retinopathy, Dr [name] HTN Hyperlipidemia anxiety ativan GERD Elevated creatinine(started after admission 05-2015, had IV contrast) Anemia, normal iron 05-2015, never had a colonoscopy CV ---NSTEMI 05-2015, cath, angiplasty ---Echo, EF 40 % (05-2015) ---Atrial fibrillation, SVT -- dx 2007, cath 2007 mild CAD. 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), sz episode 08-2015 (?) - HAs Blindness, left eye Right eye weakness after cataract surgery Osteopenia per DEXA 2004, DEXA 2009 normal, DEXA 2015 @ [facility] -1.8 H/o lung bx 2008 benign  05-2015- admitted [facility] sepsis  08-2015 admitted  B tibular Fx, no surgery, SNF 12-2015:   admitted, hid FX , surgery 01-2016: admitted, hip surgery infex   05-2016: Admitted, fall, pelvic fracture, d/c to home   PLAN: Pelvic fracture 05-2016: s/p  admission, doing PT at home, pain well-controlled with occasional hydrocodone. Already saw orthopedic follow-up and will return there as needed. We'll check a bone density test d/t recent fracture, she takes calcium and vitamin D. Strongly consider medication HTN: On Lasix as needed, metoprolol. 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 Patient reassured. DM: Well-controlled  RTC 3 months.

## 2016-08-11 NOTE — Telephone Encounter (Signed)
FYI. Pt has appt later today.

## 2016-08-11 NOTE — Patient Instructions (Signed)
GO TO THE LAB : Get the blood work     GO TO THE FRONT DESK Schedule your next appointment for a  checkup in 3 months

## 2016-08-12 NOTE — Assessment & Plan Note (Signed)
Pelvic fracture 05-2016: s/p  admission, doing PT at home, pain well-controlled with occasional hydrocodone. Already saw orthopedic follow-up and will return there as needed. We'll check a bone density test d/t recent fracture, she takes calcium and vitamin D. Strongly consider medication HTN: On Lasix as needed, metoprolol. 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 Patient reassured. DM: Well-controlled  RTC 3 months.

## 2016-08-13 ENCOUNTER — Telehealth: Payer: Self-pay | Admitting: *Deleted

## 2016-08-13 DIAGNOSIS — I2510 Atherosclerotic heart disease of native coronary artery without angina pectoris: Secondary | ICD-10-CM | POA: Diagnosis not present

## 2016-08-13 DIAGNOSIS — I129 Hypertensive chronic kidney disease with stage 1 through stage 4 chronic kidney disease, or unspecified chronic kidney disease: Secondary | ICD-10-CM | POA: Diagnosis not present

## 2016-08-13 DIAGNOSIS — L97412 Non-pressure chronic ulcer of right heel and midfoot with fat layer exposed: Secondary | ICD-10-CM | POA: Diagnosis not present

## 2016-08-13 DIAGNOSIS — L97422 Non-pressure chronic ulcer of left heel and midfoot with fat layer exposed: Secondary | ICD-10-CM | POA: Diagnosis not present

## 2016-08-13 DIAGNOSIS — E11319 Type 2 diabetes mellitus with unspecified diabetic retinopathy without macular edema: Secondary | ICD-10-CM | POA: Diagnosis not present

## 2016-08-13 DIAGNOSIS — I252 Old myocardial infarction: Secondary | ICD-10-CM | POA: Diagnosis not present

## 2016-08-13 DIAGNOSIS — L89620 Pressure ulcer of left heel, unstageable: Secondary | ICD-10-CM | POA: Diagnosis not present

## 2016-08-13 DIAGNOSIS — Z955 Presence of coronary angioplasty implant and graft: Secondary | ICD-10-CM | POA: Diagnosis not present

## 2016-08-13 DIAGNOSIS — H5462 Unqualified visual loss, left eye, normal vision right eye: Secondary | ICD-10-CM | POA: Diagnosis not present

## 2016-08-13 DIAGNOSIS — E11621 Type 2 diabetes mellitus with foot ulcer: Secondary | ICD-10-CM | POA: Diagnosis present

## 2016-08-13 DIAGNOSIS — E1140 Type 2 diabetes mellitus with diabetic neuropathy, unspecified: Secondary | ICD-10-CM | POA: Diagnosis not present

## 2016-08-13 DIAGNOSIS — D631 Anemia in chronic kidney disease: Secondary | ICD-10-CM | POA: Diagnosis not present

## 2016-08-13 DIAGNOSIS — Z87891 Personal history of nicotine dependence: Secondary | ICD-10-CM | POA: Diagnosis not present

## 2016-08-13 DIAGNOSIS — N183 Chronic kidney disease, stage 3 (moderate): Secondary | ICD-10-CM | POA: Diagnosis not present

## 2016-08-13 DIAGNOSIS — L89610 Pressure ulcer of right heel, unstageable: Secondary | ICD-10-CM | POA: Diagnosis not present

## 2016-08-13 DIAGNOSIS — E1122 Type 2 diabetes mellitus with diabetic chronic kidney disease: Secondary | ICD-10-CM | POA: Diagnosis not present

## 2016-08-13 NOTE — Telephone Encounter (Signed)
Received Physician Orders from [facility] for [facility]; forwarded to provider/SLS 06/14

## 2016-08-13 NOTE — Telephone Encounter (Signed)
Form faxed to [facility].

## 2016-08-13 NOTE — Telephone Encounter (Signed)
Received Physician Orders from [facility], forwarded to provider/SLS 06/14

## 2016-08-17 ENCOUNTER — Telehealth: Payer: Self-pay | Admitting: Internal Medicine

## 2016-08-17 MED ORDER — METOPROLOL SUCCINATE ER 25 MG PO TB24: 25.0000 mg | ORAL_TABLET | Freq: Every day | ORAL | 1 refills | Status: DC

## 2016-08-17 MED ORDER — FUROSEMIDE 40 MG PO TABS: 40.0000 mg | ORAL_TABLET | Freq: Two times a day (BID) | ORAL | 1 refills | Status: DC | PRN

## 2016-08-17 MED ORDER — ATORVASTATIN CALCIUM 80 MG PO TABS: 80.0000 mg | ORAL_TABLET | Freq: Every day | ORAL | 1 refills | Status: DC

## 2016-08-17 MED ORDER — CLOPIDOGREL BISULFATE 75 MG PO TABS: 75.0000 mg | ORAL_TABLET | Freq: Every day | ORAL | 1 refills | Status: DC

## 2016-08-17 MED ORDER — WARFARIN SODIUM 2.5 MG PO TABS: 2.5000 mg | ORAL_TABLET | Freq: Every day | ORAL | 2 refills | Status: DC

## 2016-08-17 MED ORDER — ESCITALOPRAM OXALATE 10 MG PO TABS: 20.0000 mg | ORAL_TABLET | Freq: Every day | ORAL | 1 refills | Status: DC

## 2016-08-17 NOTE — Telephone Encounter (Signed)
Meds ordered this encounter  Medications  . warfarin (COUMADIN) 2.5 MG tablet    Sig: Take 1 tablet (2.5 mg total) by mouth daily. Adjust dose as directed by coumadin clinic    Dispense:  75 tablet    Refill:  2    This is a 1 month supply  . metoprolol succinate (TOPROL-XL) 25 MG 24 hr tablet    Sig: Take 1 tablet (25 mg total) by mouth daily. Take with or immediately following a meal.    Dispense:  90 tablet    Refill:  1  . furosemide (LASIX) 40 MG tablet    Sig: Take 1 tablet (40 mg total) by mouth 2 (two) times daily as needed for fluid or edema.    Dispense:  180 tablet    Refill:  1  . escitalopram (LEXAPRO) 10 MG tablet    Sig: Take 2 tablets (20 mg total) by mouth daily.    Dispense:  180 tablet    Refill:  1  . clopidogrel (PLAVIX) 75 MG tablet    Sig: Take 1 tablet (75 mg total) by mouth daily.    Dispense:  90 tablet    Refill:  1  . atorvastatin (LIPITOR) 80 MG tablet    Sig: Take 1 tablet (80 mg total) by mouth daily.    Dispense:  90 tablet    Refill:  1
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
## 2016-08-20 ENCOUNTER — Ambulatory Visit (HOSPITAL_BASED_OUTPATIENT_CLINIC_OR_DEPARTMENT_OTHER)
Admission: RE | Admit: 2016-08-20 | Discharge: 2016-08-20 | Disposition: A | Discharge status: Home / Self Care | Payer: Medicare Other | Source: Ambulatory Visit | Attending: Internal Medicine | Admitting: Internal Medicine

## 2016-08-20 DIAGNOSIS — Z780 Asymptomatic menopausal state: Secondary | ICD-10-CM | POA: Diagnosis not present

## 2016-08-20 DIAGNOSIS — M8000XD Age-related osteoporosis with current pathological fracture, unspecified site, subsequent encounter for fracture with routine healing: Secondary | ICD-10-CM | POA: Diagnosis present

## 2016-08-20 DIAGNOSIS — M8580 Other specified disorders of bone density and structure, unspecified site: Secondary | ICD-10-CM | POA: Diagnosis not present

## 2016-08-21 DIAGNOSIS — E11621 Type 2 diabetes mellitus with foot ulcer: Secondary | ICD-10-CM | POA: Diagnosis not present

## 2016-08-25 ENCOUNTER — Ambulatory Visit (INDEPENDENT_AMBULATORY_CARE_PROVIDER_SITE_OTHER): Payer: Medicare Other | Admitting: Orthopaedic Surgery

## 2016-08-27 ENCOUNTER — Ambulatory Visit (INDEPENDENT_AMBULATORY_CARE_PROVIDER_SITE_OTHER): Payer: Medicare Other | Admitting: *Deleted

## 2016-08-27 DIAGNOSIS — I2510 Atherosclerotic heart disease of native coronary artery without angina pectoris: Secondary | ICD-10-CM | POA: Diagnosis not present

## 2016-08-27 DIAGNOSIS — Z5181 Encounter for therapeutic drug level monitoring: Secondary | ICD-10-CM

## 2016-08-27 DIAGNOSIS — Z7901 Long term (current) use of anticoagulants: Secondary | ICD-10-CM | POA: Diagnosis not present

## 2016-08-27 DIAGNOSIS — I4891 Unspecified atrial fibrillation: Secondary | ICD-10-CM | POA: Diagnosis not present

## 2016-08-27 DIAGNOSIS — I4892 Unspecified atrial flutter: Secondary | ICD-10-CM

## 2016-08-27 LAB — POCT INR: INR: 1.6

## 2016-09-16 ENCOUNTER — Ambulatory Visit (INDEPENDENT_AMBULATORY_CARE_PROVIDER_SITE_OTHER): Payer: Medicare Other | Admitting: Orthopaedic Surgery

## 2016-09-16 ENCOUNTER — Encounter (INDEPENDENT_AMBULATORY_CARE_PROVIDER_SITE_OTHER): Payer: Self-pay | Admitting: Orthopaedic Surgery

## 2016-09-16 VITALS — BP 107/52 | HR 68 | Ht 69.0 in | Wt 190.0 lb

## 2016-09-16 DIAGNOSIS — I2510 Atherosclerotic heart disease of native coronary artery without angina pectoris: Secondary | ICD-10-CM

## 2016-09-16 DIAGNOSIS — S32591D Other specified fracture of right pubis, subsequent encounter for fracture with routine healing: Secondary | ICD-10-CM | POA: Diagnosis not present

## 2016-09-16 DIAGNOSIS — S32592D Other specified fracture of left pubis, subsequent encounter for fracture with routine healing: Secondary | ICD-10-CM

## 2016-09-16 NOTE — Progress Notes (Signed)
Office Visit Note   Patient: [name] [name]           Date of Birth: [birth year]           MRN: [id number] Visit Date: 09/16/2016              Requested by: Colon Branch, [address] [place], [place] [address] PCP: Colon Branch, MD   Assessment & Plan: Visit Diagnoses:  1. Closed bilateral fracture of pubic rami with routine healing, subsequent encounter     Plan: Patient doing well. Follow-up in 6 months for recheck and will call or return sooner if there are any issues. All questions answered  Follow-Up Instructions: Return in about 6 months (around 03/19/2017).   Orders:  No orders of the defined types were placed in this encounter.  No orders of the defined types were placed in this encounter.     Procedures: No procedures performed   Clinical Data: No additional findings.   Subjective: Chief Complaint  Patient presents with  . Pelvis - Fracture, Follow-up    HPI Patient returns. States that she is doing well. Ambulate with walker.  Review of Systems  Constitutional: Negative.   HENT: Negative.   Genitourinary: Negative.   Skin: Negative.   Neurological: Negative.   Psychiatric/Behavioral: Negative.      Objective: Vital Signs: BP (!) 107/52   Pulse 68   Ht 5\' 9"  (1.753 m)   Wt 190 lb (86.2 kg)   BMI 28.06 kg/m   Physical Exam  Constitutional: She is oriented to person, place, and time. No distress.  HENT:  Head: Normocephalic and atraumatic.  Eyes: Pupils are equal, round, and reactive to light. EOM are normal.  Pulmonary/Chest: No respiratory distress.  Musculoskeletal: Normal range of motion.  Ambulating very well with rolling walker  Neurological: She is alert and oriented to person, place, and time.  Skin: Skin is warm and dry.  Psychiatric: She has a normal mood and affect.    Ortho Exam  Specialty Comments:  No specialty comments available.  Imaging: No results found.   PMFS History: Patient Active  Problem List   Diagnosis Date Noted  . Foot ulcer ([place]) 07/24/2016  . Pelvic fracture ([place]) 06/15/2016  . Closed fracture of ramus of right pubis ([place])   . Anemia due to chronic kidney disease   . Pruritus 02/18/2016  . Decreased strength of lower extremity   . Gram-negative infection   . CKD (chronic kidney disease) stage 3, GFR 30-59 ml/min   . Postoperative anemia due to acute blood loss 01/04/2016  . Left hip postoperative wound infection 01/02/2016  . Closed intertrochanteric fracture of hip, left, sequela 12/25/2015  . Anemia 12/01/2015  . Ankle fracture, left 09/17/2015  . Ankle fracture, bimalleolar, closed   . Bilateral fibular fractures 09/14/2015  . Hypertensive heart disease 05/20/2015  . CAD (coronary artery disease) 05/20/2015  . Acute systolic CHF (congestive heart failure) ([place]) 05/20/2015  . Anticoagulated on Coumadin 05/20/2015  . Type 2 diabetes mellitus with vascular disease ([place]) 05/17/2015  . NSTEMI (non-ST elevated myocardial infarction) ([place])   . Bacteremia due to Escherichia coli 05/11/2015  . Acute kidney injury superimposed on chronic kidney disease ([place] ) 05/11/2015  . Uncontrolled hypertension 05/11/2015  . 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[place]) 08/65/7846  . PCP NOTES >>>>>>>>>>>>>>>> 12/28/2014  . Depression 05/17/2014  . Memory loss 05/17/2014  . Anxiety and depression, PCP notes  11/24/2013  . Severe obesity (BMI >=  40) ([place]) 07/14/2013  . Encounter for therapeutic drug monitoring 06/02/2013  . Dilantin toxicity 11/21/2012  . History of encephalitis 11/18/2012  . Localization-related (focal) (partial) epilepsy and epileptic syndromes with complex partial seizures, without mention of intractable epilepsy 09/27/2012  . Intertrochanteric fracture of right hip ([place]) 07/13/2012  . DM (diabetes mellitus) ([place]) 01/19/2012  . GERD (gastroesophageal reflux disease) 10/05/2011  . Shoulder pain 08/27/2011  . Seizure disorder ([place]) 05/24/2011  . Annual physical exam  02/10/2011  . Atrial fibrillation ([place])   . 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 Hyperlipidemia 03/27/2006  . Hereditary and idiopathic peripheral neuropathy 03/27/2006  . 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 Essential hypertension 03/27/2006  . Osteoporosis 03/27/2006   Past Medical History:  Diagnosis Date  . Atrial fibrillation ([place])    SVT dx 2007, cath 2007 mild CAD, then had an cardioversion, ablation; still on coumadin , has occ palpitation, EKG 03-2010 NSR  . Blindness of left eye   . CKD (chronic kidney disease), stage III 06/16/2016   [name] 06/17/2016  . DIABETES MELLITUS, TYPE II 03/27/2006   dr [name]  . Eye muscle weakness    Right eye weakness after cataract surgery  . GERD (gastroesophageal reflux disease) 10/05/2011  . 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 HYPERLIPIDEMIA 03/27/2006  . HYPERTENSION 03/27/2006  . Intertrochanteric fracture of right hip ([place]) 07/13/2012  . 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 Osteopenia 2004   Dexa 2004 showed Osteopenia, DEXA 03/2007 normal  . Osteopenia   . Other chronic cystitis with hematuria   . Pelvic fracture ([place]) 06/16/2016   S/P fall  . Recurrent urinary tract infection    Seeing Urology  . 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 Seizures ([place])   . 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[place]) 0/96/2836    Family History  Problem Relation Age of Onset  . Diabetes Father   . Heart attack Father 82  . Diabetes Sister   . Drug abuse Son   . Cancer Neg Hx        no hx of colon or breast cancer    Past Surgical History:  Procedure Laterality Date  . ANKLE FRACTURE SURGERY Bilateral 08/2015   "right one was in 2 places"  . CARDIAC CATHETERIZATION N/A 05/17/2015   Procedure: Left Heart Cath and Coronary Angiography;  Surgeon: Jettie Booze, MD;  Location: [facility];  Service: Cardiovascular;  Laterality: N/A;  . CARDIAC CATHETERIZATION N/A 05/17/2015   Procedure: Coronary Balloon  Angioplasty;  Surgeon: Jettie Booze, MD;  Location: [facility];  Service: Cardiovascular;  Laterality: N/A;  . FEMUR IM NAIL Right 07/15/2012   Procedure: INTRAMEDULLARY (IM) NAIL HIP;  Surgeon: Johnny Bridge, MD;  Location: [facility];  Service: Orthopedics;  Laterality: Right;  . FEMUR IM NAIL Left 12/02/2015   Procedure: INTRAMEDULLARY (IM) NAIL FEMORAL;  Surgeon: [name], MD;  Location: [facility];  Service: Orthopedics;  Laterality: Left;  . FRACTURE SURGERY    . INCISION AND DRAINAGE HIP Left 01/03/2016   Procedure: IRRIGATION AND DEBRIDEMENT HIP;  Surgeon: [name], MD;  Location: [facility];  Service: Orthopedics;  Laterality: Left;  . SHOULDER SURGERY Bilateral    "don't remember which side"   Social History   Occupational History  . retired  Retired   Social History Main Topics  . Smoking status: Former Smoker    Types: Cigarettes  Quit date: 03/02/1978  . Smokeless tobacco: Never Used  . Alcohol use No  . Drug use: No  . Sexual activity: No

## 2016-09-17 ENCOUNTER — Other Ambulatory Visit: Payer: Self-pay | Admitting: Endocrinology

## 2016-09-22 ENCOUNTER — Ambulatory Visit (INDEPENDENT_AMBULATORY_CARE_PROVIDER_SITE_OTHER): Payer: Medicare Other | Admitting: *Deleted

## 2016-09-22 DIAGNOSIS — I2510 Atherosclerotic heart disease of native coronary artery without angina pectoris: Secondary | ICD-10-CM | POA: Diagnosis not present

## 2016-09-22 DIAGNOSIS — Z7901 Long term (current) use of anticoagulants: Secondary | ICD-10-CM

## 2016-09-22 DIAGNOSIS — Z5181 Encounter for therapeutic drug level monitoring: Secondary | ICD-10-CM

## 2016-09-22 DIAGNOSIS — I4891 Unspecified atrial fibrillation: Secondary | ICD-10-CM

## 2016-09-22 DIAGNOSIS — I4892 Unspecified atrial flutter: Secondary | ICD-10-CM | POA: Diagnosis not present

## 2016-09-22 LAB — POCT INR: INR: 1.8

## 2016-09-24 ENCOUNTER — Telehealth: Payer: Self-pay | Admitting: Internal Medicine

## 2016-09-24 NOTE — Telephone Encounter (Signed)
Pt called in to request a refill on medications. I asked pt which medications and I will send request. Pt says that there are so many. She would like to speak with CMA directly to request.    Please call back home # on file.

## 2016-09-25 NOTE — Telephone Encounter (Signed)
Tried calling Pt, telephone busy, will try again later.

## 2016-09-25 NOTE — Telephone Encounter (Signed)
Spoke w/ Pt's husband, Jenny Reichmann, he is needing refills. See his chart.

## 2016-10-02 ENCOUNTER — Encounter (HOSPITAL_BASED_OUTPATIENT_CLINIC_OR_DEPARTMENT_OTHER): Payer: Self-pay | Admitting: Emergency Medicine

## 2016-10-02 ENCOUNTER — Ambulatory Visit (INDEPENDENT_AMBULATORY_CARE_PROVIDER_SITE_OTHER): Payer: Medicare Other | Admitting: Internal Medicine

## 2016-10-02 ENCOUNTER — Emergency Department (HOSPITAL_BASED_OUTPATIENT_CLINIC_OR_DEPARTMENT_OTHER): Payer: Medicare Other

## 2016-10-02 ENCOUNTER — Encounter: Payer: Self-pay | Admitting: Internal Medicine

## 2016-10-02 ENCOUNTER — Inpatient Hospital Stay (HOSPITAL_BASED_OUTPATIENT_CLINIC_OR_DEPARTMENT_OTHER)
Admission: EM | Admit: 2016-10-02 | Discharge: 2016-10-03 | DRG: 690 | Disposition: A | Discharge status: Home / Self Care | Payer: Medicare Other | Attending: Internal Medicine | Admitting: Internal Medicine

## 2016-10-02 VITALS — BP 108/68 | HR 68 | Temp 98.0°F | Resp 14 | Ht 69.0 in | Wt 184.2 lb

## 2016-10-02 DIAGNOSIS — H5462 Unqualified visual loss, left eye, normal vision right eye: Secondary | ICD-10-CM | POA: Diagnosis present

## 2016-10-02 DIAGNOSIS — N390 Urinary tract infection, site not specified: Principal | ICD-10-CM | POA: Diagnosis present

## 2016-10-02 DIAGNOSIS — R42 Dizziness and giddiness: Secondary | ICD-10-CM

## 2016-10-02 DIAGNOSIS — G8929 Other chronic pain: Secondary | ICD-10-CM | POA: Diagnosis not present

## 2016-10-02 DIAGNOSIS — Z8781 Personal history of (healed) traumatic fracture: Secondary | ICD-10-CM | POA: Diagnosis not present

## 2016-10-02 DIAGNOSIS — N179 Acute kidney failure, unspecified: Secondary | ICD-10-CM | POA: Diagnosis not present

## 2016-10-02 DIAGNOSIS — R51 Headache: Secondary | ICD-10-CM | POA: Diagnosis not present

## 2016-10-02 DIAGNOSIS — Z8619 Personal history of other infectious and parasitic diseases: Secondary | ICD-10-CM

## 2016-10-02 DIAGNOSIS — Z7901 Long term (current) use of anticoagulants: Secondary | ICD-10-CM

## 2016-10-02 DIAGNOSIS — Z87891 Personal history of nicotine dependence: Secondary | ICD-10-CM

## 2016-10-02 DIAGNOSIS — Z888 Allergy status to other drugs, medicaments and biological substances status: Secondary | ICD-10-CM

## 2016-10-02 DIAGNOSIS — Z794 Long term (current) use of insulin: Secondary | ICD-10-CM | POA: Diagnosis not present

## 2016-10-02 DIAGNOSIS — Z8249 Family history of ischemic heart disease and other diseases of the circulatory system: Secondary | ICD-10-CM | POA: Diagnosis not present

## 2016-10-02 DIAGNOSIS — E1165 Type 2 diabetes mellitus with hyperglycemia: Secondary | ICD-10-CM | POA: Diagnosis present

## 2016-10-02 DIAGNOSIS — E1101 Type 2 diabetes mellitus with hyperosmolarity with coma: Secondary | ICD-10-CM | POA: Diagnosis present

## 2016-10-02 DIAGNOSIS — I4891 Unspecified atrial fibrillation: Secondary | ICD-10-CM | POA: Diagnosis present

## 2016-10-02 DIAGNOSIS — I2510 Atherosclerotic heart disease of native coronary artery without angina pectoris: Secondary | ICD-10-CM | POA: Diagnosis not present

## 2016-10-02 DIAGNOSIS — F419 Anxiety disorder, unspecified: Secondary | ICD-10-CM | POA: Diagnosis present

## 2016-10-02 DIAGNOSIS — Z833 Family history of diabetes mellitus: Secondary | ICD-10-CM

## 2016-10-02 DIAGNOSIS — Z880 Allergy status to penicillin: Secondary | ICD-10-CM

## 2016-10-02 DIAGNOSIS — K219 Gastro-esophageal reflux disease without esophagitis: Secondary | ICD-10-CM | POA: Diagnosis present

## 2016-10-02 DIAGNOSIS — E1159 Type 2 diabetes mellitus with other circulatory complications: Secondary | ICD-10-CM

## 2016-10-02 DIAGNOSIS — E785 Hyperlipidemia, unspecified: Secondary | ICD-10-CM | POA: Diagnosis present

## 2016-10-02 DIAGNOSIS — Z79899 Other long term (current) drug therapy: Secondary | ICD-10-CM | POA: Diagnosis not present

## 2016-10-02 DIAGNOSIS — N183 Chronic kidney disease, stage 3 (moderate): Secondary | ICD-10-CM | POA: Diagnosis not present

## 2016-10-02 DIAGNOSIS — G40909 Epilepsy, unspecified, not intractable, without status epilepticus: Secondary | ICD-10-CM | POA: Diagnosis present

## 2016-10-02 DIAGNOSIS — Z9889 Other specified postprocedural states: Secondary | ICD-10-CM

## 2016-10-02 DIAGNOSIS — F329 Major depressive disorder, single episode, unspecified: Secondary | ICD-10-CM | POA: Diagnosis present

## 2016-10-02 DIAGNOSIS — R519 Headache, unspecified: Secondary | ICD-10-CM

## 2016-10-02 DIAGNOSIS — I10 Essential (primary) hypertension: Secondary | ICD-10-CM | POA: Diagnosis present

## 2016-10-02 DIAGNOSIS — E11319 Type 2 diabetes mellitus with unspecified diabetic retinopathy without macular edema: Secondary | ICD-10-CM | POA: Diagnosis present

## 2016-10-02 DIAGNOSIS — I130 Hypertensive heart and chronic kidney disease with heart failure and stage 1 through stage 4 chronic kidney disease, or unspecified chronic kidney disease: Secondary | ICD-10-CM | POA: Diagnosis not present

## 2016-10-02 DIAGNOSIS — Z87440 Personal history of urinary (tract) infections: Secondary | ICD-10-CM

## 2016-10-02 DIAGNOSIS — R739 Hyperglycemia, unspecified: Secondary | ICD-10-CM | POA: Diagnosis present

## 2016-10-02 DIAGNOSIS — E1122 Type 2 diabetes mellitus with diabetic chronic kidney disease: Secondary | ICD-10-CM | POA: Diagnosis present

## 2016-10-02 LAB — BASIC METABOLIC PANEL
Anion gap: 16 — ABNORMAL HIGH (ref 5–15)
BUN: 44 mg/dL — AB (ref 6–20)
CO2: 25 mmol/L (ref 22–32)
CREATININE: 2.31 mg/dL — AB (ref 0.44–1.00)
Calcium: 8.7 mg/dL — ABNORMAL LOW (ref 8.9–10.3)
Chloride: 85 mmol/L — ABNORMAL LOW (ref 101–111)
GFR calc Af Amer: 23 mL/min — ABNORMAL LOW (ref >60)
GFR, EST NON AFRICAN AMERICAN: 20 mL/min — AB (ref >60)
Glucose, Bld: 780 mg/dL (ref 65–99)
POTASSIUM: 3.5 mmol/L (ref 3.5–5.1)
SODIUM: 126 mmol/L — AB (ref 135–145)

## 2016-10-02 LAB — CBC
HCT: 34.1 % — ABNORMAL LOW (ref 36.0–46.0)
HEMATOCRIT: 31.8 % — AB (ref 36.0–46.0)
Hemoglobin: 11.4 g/dL — ABNORMAL LOW (ref 12.0–15.0)
Hemoglobin: 11.3 g/dL — ABNORMAL LOW (ref 12.0–15.0)
MCH: 30 pg (ref 26.0–34.0)
MCH: 30.5 pg (ref 26.0–34.0)
MCHC: 33.4 g/dL (ref 30.0–36.0)
MCHC: 35.5 g/dL (ref 30.0–36.0)
MCV: 89.7 fL (ref 78.0–100.0)
MCV: 85.7 fL (ref 78.0–100.0)
PLATELETS: 279 10*3/uL (ref 150–400)
PLATELETS: 308 10*3/uL (ref 150–400)
RBC: 3.8 MIL/uL — AB (ref 3.87–5.11)
RBC: 3.71 MIL/uL — ABNORMAL LOW (ref 3.87–5.11)
RDW: 13.1 % (ref 11.5–15.5)
RDW: 13.2 % (ref 11.5–15.5)
WBC: 9 10*3/uL (ref 4.0–10.5)
WBC: 10 10*3/uL (ref 4.0–10.5)

## 2016-10-02 LAB — URINALYSIS, MICROSCOPIC (REFLEX)

## 2016-10-02 LAB — GLUCOSE, CAPILLARY
GLUCOSE-CAPILLARY: 323 mg/dL — AB (ref 65–99)
GLUCOSE-CAPILLARY: 185 mg/dL — AB (ref 65–99)
GLUCOSE-CAPILLARY: 195 mg/dL — AB (ref 65–99)
Glucose-Capillary: 185 mg/dL — ABNORMAL HIGH (ref 65–99)
Glucose-Capillary: 151 mg/dL — ABNORMAL HIGH (ref 65–99)
Glucose-Capillary: 153 mg/dL — ABNORMAL HIGH (ref 65–99)

## 2016-10-02 LAB — URINALYSIS, ROUTINE W REFLEX MICROSCOPIC
BILIRUBIN URINE: NEGATIVE
Glucose, UA: >=500 mg/dL — AB
KETONES UR: NEGATIVE mg/dL
Nitrite: NEGATIVE
PROTEIN: NEGATIVE mg/dL
Specific Gravity, Urine: 1.014 (ref 1.005–1.030)
pH: 6.5 (ref 5.0–8.0)

## 2016-10-02 LAB — POCT GLUCOSE (DEVICE FOR HOME USE)

## 2016-10-02 LAB — CBG MONITORING, ED
Glucose-Capillary: >600 mg/dL (ref 65–99)
Glucose-Capillary: >600 mg/dL (ref 65–99)

## 2016-10-02 LAB — PROTIME-INR
INR: 2.27
PROTHROMBIN TIME: 25.4 s — AB (ref 11.4–15.2)

## 2016-10-02 LAB — CREATININE, SERUM
Creatinine, Ser: 1.88 mg/dL — ABNORMAL HIGH (ref 0.44–1.00)
GFR calc Af Amer: 29 mL/min — ABNORMAL LOW (ref >60)
GFR calc non Af Amer: 25 mL/min — ABNORMAL LOW (ref >60)

## 2016-10-02 MED ORDER — METOPROLOL SUCCINATE ER 25 MG PO TB24
25.0000 mg | ORAL_TABLET | Freq: Every day | ORAL | Status: DC
  Administered 2016-10-03: 25 mg via ORAL
  Filled 2016-10-02: qty 1

## 2016-10-02 MED ORDER — SODIUM CHLORIDE 0.9 % IV BOLUS (SEPSIS): 1000.0000 mL | Freq: Once | INTRAVENOUS | Status: DC

## 2016-10-02 MED ORDER — WARFARIN - PHYSICIAN DOSING INPATIENT: Freq: Every day | Status: DC

## 2016-10-02 MED ORDER — SODIUM CHLORIDE 0.9 % IV SOLN
INTRAVENOUS | Status: DC
  Filled 2016-10-02: qty 1

## 2016-10-02 MED ORDER — WARFARIN SODIUM 2.5 MG PO TABS
2.5000 mg | ORAL_TABLET | Freq: Every day | ORAL | Status: DC
  Administered 2016-10-02: 2.5 mg via ORAL
  Filled 2016-10-02: qty 1

## 2016-10-02 MED ORDER — ENOXAPARIN SODIUM 40 MG/0.4ML ~~LOC~~ SOLN: 40.0000 mg | SUBCUTANEOUS | Status: DC

## 2016-10-02 MED ORDER — SODIUM CHLORIDE 0.9 % IV SOLN
INTRAVENOUS | Status: DC
  Administered 2016-10-02: 5.4 [IU]/h via INTRAVENOUS
  Filled 2016-10-02: qty 1

## 2016-10-02 MED ORDER — SENNOSIDES-DOCUSATE SODIUM 8.6-50 MG PO TABS: 1.0000 | ORAL_TABLET | Freq: Every evening | ORAL | Status: DC | PRN

## 2016-10-02 MED ORDER — ESCITALOPRAM OXALATE 20 MG PO TABS
20.0000 mg | ORAL_TABLET | Freq: Every day | ORAL | Status: DC
  Administered 2016-10-03: 20 mg via ORAL
  Filled 2016-10-02: qty 1

## 2016-10-02 MED ORDER — SODIUM CHLORIDE 0.9 % IV SOLN
Freq: Once | INTRAVENOUS | Status: AC
  Administered 2016-10-02: 14:00:00 via INTRAVENOUS

## 2016-10-02 MED ORDER — SODIUM CHLORIDE 0.9 % IV SOLN
INTRAVENOUS | Status: DC
  Filled 2016-10-02 (×2): qty 1

## 2016-10-02 MED ORDER — ACETAMINOPHEN 325 MG PO TABS: 650.0000 mg | ORAL_TABLET | Freq: Four times a day (QID) | ORAL | Status: DC | PRN

## 2016-10-02 MED ORDER — ACETAMINOPHEN 650 MG RE SUPP: 650.0000 mg | Freq: Four times a day (QID) | RECTAL | Status: DC | PRN

## 2016-10-02 MED ORDER — INSULIN REGULAR BOLUS VIA INFUSION
0.0000 [IU] | Freq: Three times a day (TID) | INTRAVENOUS | Status: DC
  Filled 2016-10-02: qty 10

## 2016-10-02 MED ORDER — CLOPIDOGREL BISULFATE 75 MG PO TABS
75.0000 mg | ORAL_TABLET | Freq: Every day | ORAL | Status: DC
  Administered 2016-10-03: 75 mg via ORAL
  Filled 2016-10-02: qty 1

## 2016-10-02 MED ORDER — DEXTROSE-NACL 5-0.45 % IV SOLN
INTRAVENOUS | Status: DC
  Administered 2016-10-02: 19:00:00 via INTRAVENOUS

## 2016-10-02 MED ORDER — FUROSEMIDE 40 MG PO TABS: 40.0000 mg | ORAL_TABLET | Freq: Two times a day (BID) | ORAL | Status: DC | PRN

## 2016-10-02 MED ORDER — SODIUM CHLORIDE 0.9 % IV SOLN
INTRAVENOUS | Status: DC
  Administered 2016-10-02 – 2016-10-03 (×2): via INTRAVENOUS

## 2016-10-02 MED ORDER — ATORVASTATIN CALCIUM 40 MG PO TABS
80.0000 mg | ORAL_TABLET | Freq: Every day | ORAL | Status: DC
  Administered 2016-10-03: 80 mg via ORAL
  Filled 2016-10-02: qty 2

## 2016-10-02 MED ORDER — CEFTRIAXONE SODIUM 1 G IJ SOLR
1.0000 g | Freq: Once | INTRAMUSCULAR | Status: AC
  Administered 2016-10-02: 1 g via INTRAVENOUS
  Filled 2016-10-02: qty 10

## 2016-10-02 MED ORDER — SODIUM CHLORIDE 0.9 % IV BOLUS (SEPSIS)
500.0000 mL | Freq: Once | INTRAVENOUS | Status: AC
  Administered 2016-10-02: 500 mL via INTRAVENOUS

## 2016-10-02 MED ORDER — DEXTROSE 50 % IV SOLN: 25.0000 mL | INTRAVENOUS | Status: DC | PRN

## 2016-10-02 NOTE — Patient Instructions (Signed)
Please go to the ER for further evaluation °

## 2016-10-02 NOTE — ED Triage Notes (Signed)
Pt reports blood sugar >500 at MD appt today; feels "shakey" but denies pain; A & O x 4
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
## 2016-10-02 NOTE — ED Provider Notes (Signed)
[facility] DEPT [facility] Provider Note   CSN: [id number] Arrival date & time: 10/02/16  1121     History   Chief Complaint Chief Complaint  Patient presents with  . Hyperglycemia    HPI [name] [name] is a 76 y.o. female.  HPI  Pt presenting from her doctor's office upstairs for further evaluation.  Pt state she has been feeling more weak than usual over the past several days. She is weak with walking and starts shaking due to weakness when walking from the table to the bathroom.  She c/o feeling lightheaded and has some headache.  No focal weakness or changes in speech or vision.  She states she checks her blood glucose twice daily- last night was in the 400s.  Denies fever.  No vomiting or diarrhea.  She states her mouth feels dry.  There are no other associated systemic symptoms, there are no other alleviating or modifying factors.   Past Medical History:  Diagnosis Date  . Atrial fibrillation ([place])    SVT dx 2007, cath 2007 mild CAD, then had an cardioversion, ablation; still on coumadin , has occ palpitation, EKG 03-2010 NSR  . Blindness of left eye   . CKD (chronic kidney disease), stage III 06/16/2016   [name] 06/17/2016  . DIABETES MELLITUS, TYPE II 03/27/2006   dr [name]  . Eye muscle weakness    Right eye weakness after cataract surgery  . GERD (gastroesophageal reflux disease) 10/05/2011  . 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 HYPERLIPIDEMIA 03/27/2006  . HYPERTENSION 03/27/2006  . Intertrochanteric fracture of right hip ([place]) 07/13/2012  . 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 Osteopenia 2004   Dexa 2004 showed Osteopenia, DEXA 03/2007 normal  . Osteopenia   . Other chronic cystitis with hematuria   . Pelvic fracture ([place]) 06/16/2016   S/P fall  . Recurrent urinary tract infection    Seeing Urology  . 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 Seizures ([place])   . 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[place]) 0/99/8338    Patient Active  Problem List   Diagnosis Date Noted  . Hyperglycemia 10/02/2016  . Foot ulcer ([place]) 07/24/2016  . Closed fracture of ramus of right pubis ([place])   . Gram-negative infection   . CKD (chronic kidney disease) stage 3, GFR 30-59 ml/min   . Postoperative anemia due to acute blood loss 01/04/2016  . Left hip postoperative wound infection 01/02/2016  . Closed intertrochanteric fracture of hip, left, sequela 12/25/2015  . CAD (coronary artery disease) 05/20/2015  . Acute systolic CHF (congestive heart failure) ([place]) 05/20/2015  . Anticoagulated on Coumadin 05/20/2015  . Type 2 diabetes mellitus with vascular disease ([place]) 05/17/2015  . NSTEMI (non-ST elevated myocardial infarction) ([place])   . Acute kidney injury superimposed on chronic kidney disease ([place]) 05/11/2015  . PCP NOTES >>>>>>>>>>>>>>>> 12/28/2014  . Memory loss 05/17/2014  . Anxiety and depression, PCP notes  11/24/2013  . Severe obesity (BMI >= 40) ([place]) 07/14/2013  . Encounter for therapeutic drug monitoring 06/02/2013  . Dilantin toxicity 11/21/2012  . History of encephalitis 11/18/2012  . Localization-related (focal) (partial) epilepsy and epileptic syndromes with complex partial seizures, without mention of intractable epilepsy 09/27/2012  . Intertrochanteric fracture of right hip ([place]) 07/13/2012  . GERD (gastroesophageal reflux disease) 10/05/2011  . Seizure disorder ([place]) 05/24/2011  . Annual physical exam 02/10/2011  . Atrial fibrillation ([place])   . 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  Hyperlipidemia 03/27/2006  . Hereditary and idiopathic peripheral neuropathy 03/27/2006  . 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 Essential hypertension 03/27/2006  . Osteoporosis 03/27/2006    Past Surgical History:  Procedure Laterality Date  . ANKLE FRACTURE SURGERY Bilateral 08/2015   "right one was in 2 places"  . CARDIAC CATHETERIZATION N/A 05/17/2015   Procedure: Left Heart Cath and Coronary Angiography;  Surgeon: Jettie Booze,  MD;  Location: [facility];  Service: Cardiovascular;  Laterality: N/A;  . CARDIAC CATHETERIZATION N/A 05/17/2015   Procedure: Coronary Balloon Angioplasty;  Surgeon: Jettie Booze, MD;  Location: [facility];  Service: Cardiovascular;  Laterality: N/A;  . FEMUR IM NAIL Right 07/15/2012   Procedure: INTRAMEDULLARY (IM) NAIL HIP;  Surgeon: Johnny Bridge, MD;  Location: [facility];  Service: Orthopedics;  Laterality: Right;  . FEMUR IM NAIL Left 12/02/2015   Procedure: INTRAMEDULLARY (IM) NAIL FEMORAL;  Surgeon: [name], MD;  Location: [place] [facility];  Service: Orthopedics;  Laterality: Left;  . FRACTURE SURGERY    . INCISION AND DRAINAGE HIP Left 01/03/2016   Procedure: IRRIGATION AND DEBRIDEMENT HIP;  Surgeon: [name], MD;  Location: [facility];  Service: Orthopedics;  Laterality: Left;  . SHOULDER SURGERY Bilateral    "don't remember which side"    OB History    No data available       Home Medications    Prior to Admission medications   Medication Sig Start Date End Date Taking? Authorizing Provider  atorvastatin (LIPITOR) 80 MG tablet Take 1 tablet (80 mg total) by mouth daily. 08/17/16   Copland, Gay Filler, MD  calcium-vitamin D (OSCAL WITH D) 500-200 MG-UNIT per tablet Take 1 tablet by mouth daily.    [provider]  clopidogrel (PLAVIX) 75 MG tablet Take 1 tablet (75 mg total) by mouth daily. 08/17/16   Copland, Gay Filler, MD  escitalopram (LEXAPRO) 10 MG tablet Take 2 tablets (20 mg total) by mouth daily. 08/17/16   Copland, Gay Filler, MD  furosemide (LASIX) 40 MG tablet Take 1 tablet (40 mg total) by mouth 2 (two) times daily as needed for fluid or edema. 08/17/16   Copland, Gay Filler, MD  HYDROcodone-acetaminophen (NORCO/VICODIN) 5-325 MG tablet Take 1-2 tablets by mouth 3 (three) times daily as needed for moderate pain or severe pain. Patient not taking: Reported on 08/11/2016 07/28/16   Colon Branch, MD  insulin NPH-regular Human (NOVOLIN 70/30) (70-30) 100  UNIT/ML injection 26 units with breakfast and 11 units with the evening meal 07/24/16   Renato Shin, MD  metoprolol succinate (TOPROL-XL) 25 MG 24 hr tablet Take 1 tablet (25 mg total) by mouth daily. Take with or immediately following a meal. 08/17/16   Copland, Gay Filler, MD  Multiple Vitamins-Minerals (MULTIVITAMIN ADULT PO) Take 1 tablet by mouth daily with breakfast.     [provider]  pantoprazole (PROTONIX) 40 MG tablet Take 1 tablet (40 mg total) by mouth daily. Patient not taking: Reported on 10/02/2016 05/20/16   Colon Branch, MD  promethazine (PHENERGAN) 12.5 MG tablet Take 1 tablet (12.5 mg total) by mouth every 6 (six) hours as needed for nausea or vomiting. Patient not taking: Reported on 10/02/2016 06/09/16   Colon Branch, MD  triamcinolone ointment (KENALOG) 0.5 % Apply 1 application topically 4 (four) times daily. As needed for itching Patient not taking: Reported on 08/11/2016 05/25/16   Renato Shin, MD  warfarin (COUMADIN) 2.5 MG tablet Take 1 tablet (  2.5 mg total) by mouth daily. Adjust dose as directed by coumadin clinic 08/17/16   Copland, Gay Filler, MD    Family History Family History  Problem Relation Age of Onset  . Diabetes Father   . Heart attack Father 63  . Diabetes Sister   . Drug abuse Son   . Cancer Neg Hx        no hx of colon or breast cancer    Social History Social History  Substance Use Topics  . Smoking status: Former Smoker    Types: Cigarettes    Quit date: 03/02/1978  . Smokeless tobacco: Never Used  . Alcohol use No     Allergies   Procaine hcl and Amoxicillin   Review of Systems Review of Systems  ROS reviewed and all otherwise negative except for mentioned in HPI   Physical Exam Updated Vital Signs BP 123/60   Pulse 66   Temp 97.6 F (36.4 C) (Oral)   Resp 16   Ht 5\' 9"  (1.753 m)   Wt 83.6 kg (184 lb 4 oz)   SpO2 100%   BMI 27.21 kg/m  Vitals reviewed Physical Exam Physical Examination: General appearance - alert,  well appearing, and in no distress Mental status - alert, oriented to person, place, and time Eyes - no conjunctival injection, no scleral icterus Mouth - mucous membranes dry Neck - supple, no significant adenopathy Chest - clear to auscultation, no wheezes, rales or rhonchi, symmetric air entry Heart - normal rate, regular rhythm, normal S1, S2, no murmurs, rubs, clicks or gallops Abdomen - soft, nontender, nondistended, no masses or organomegaly Neurological - alert, oriented, normal speech,strength 5/5 in extremities x 4, sensation intact Extremities - peripheral pulses normal, no pedal edema, no clubbing or cyanosis Skin - normal coloration and turgor, no rashes  ED Treatments / Results  Labs (all labs ordered are listed, but only abnormal results are displayed) Labs Reviewed  CBC - Abnormal; Notable for the following:       Result Value   RBC 3.80 (*)    Hemoglobin 11.4 (*)    HCT 34.1 (*)    All other components within normal limits  BASIC METABOLIC PANEL - Abnormal; Notable for the following:    Sodium 126 (*)    Chloride 85 (*)    Glucose, Bld 780 (*)    BUN 44 (*)    Creatinine, Ser 2.31 (*)    Calcium 8.7 (*)    GFR calc non Af Amer 20 (*)    GFR calc Af Amer 23 (*)    Anion gap 16 (*)    All other components within normal limits  URINALYSIS, ROUTINE W REFLEX MICROSCOPIC - Abnormal; Notable for the following:    APPearance CLOUDY (*)    Glucose, UA >=500 (*)    Hgb urine dipstick SMALL (*)    Leukocytes, UA LARGE (*)    All other components within normal limits  PROTIME-INR - Abnormal; Notable for the following:    Prothrombin Time 25.4 (*)    All other components within normal limits  URINALYSIS, MICROSCOPIC (REFLEX) - Abnormal; Notable for the following:    Bacteria, UA MANY (*)    Squamous Epithelial / LPF 0-5 (*)    All other components within normal limits  CBG MONITORING, ED - Abnormal; Notable for the following:    Glucose-Capillary >600 (*)    All  other components within normal limits  CBG MONITORING, ED - Abnormal; Notable for the following:  Glucose-Capillary >600 (*)    All other components within normal limits  CBG MONITORING, ED - Abnormal; Notable for the following:    Glucose-Capillary >600 (*)    All other components within normal limits  URINE CULTURE    EKG  EKG Interpretation  Date/Time:  Friday October 02 2016 12:00:47 EDT Ventricular Rate:  67 PR Interval:    QRS Duration: 111 QT Interval:  443 QTC Calculation: 468 R Axis:   -49 Text Interpretation:  Sinus rhythm LAD, consider left anterior fascicular block Low voltage, precordial leads Borderline repolarization abnormality Baseline wander in lead(s) I III aVL No significant change since last tracing Confirmed by Alfonzo Beers [phone]) on 10/02/2016 12:14:40 PM       Radiology Dg Chest 2 View  Result Date: 10/02/2016 CLINICAL DATA:  Dizziness for 1 week. EXAM: CHEST  2 VIEW COMPARISON:  Chest x-ray dated April 13, 2016. FINDINGS: Mild cardiomegaly patella unchanged. Normal pulmonary vascularity. No focal consolidation, pleural effusion, or pneumothorax. Streaky opacity in the right lower lobe is unchanged, and likely represents atelectasis. Surgical clips in the right paratracheal region are unchanged. No acute osseous abnormality. Healed right sixth rib fracture. IMPRESSION: 1. Mild cardiomegaly, unchanged. 2. Streaky atelectasis in the right lower lobe, similar to prior study. Electronically Signed   By: Titus Dubin M.D.   On: 10/02/2016 12:36   Ct Head Wo Contrast  Result Date: 10/02/2016 CLINICAL DATA:  Status post fall 4 days ago. EXAM: CT HEAD WITHOUT CONTRAST TECHNIQUE: Contiguous axial images were obtained from the base of the skull through the vertex without intravenous contrast. COMPARISON:  Head CT dated November 18, 2012. FINDINGS: Brain: No evidence of acute infarction, hemorrhage, hydrocephalus, extra-axial collection or mass lesion/mass effect.  Left temporal lobe encephalomalacia is unchanged. Periventricular white matter and corona radiata hypodensities favor chronic ischemic microvascular white matter disease, unchanged. Vascular: No hyperdense vessel or unexpected calcification. Skull: Normal. Negative for fracture or focal lesion. Sinuses/Orbits: The bilateral paranasal sinuses and mastoid air cells are clear. The orbits are unremarkable. Other: None. IMPRESSION: 1.  No acute intracranial abnormality. 2. 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 Electronically Signed   By: Titus Dubin M.D.   On: 10/02/2016 12:41    Procedures Procedures (including critical care time)  Medications Ordered in ED Medications  dextrose 5 %-0.45 % sodium chloride infusion (not administered)  insulin regular (NOVOLIN R,HUMULIN R) 100 Units in sodium chloride 0.9 % 100 mL (1 Units/mL) infusion ( Intravenous Transfusing/Transfer 10/02/16 1507)  cefTRIAXone (ROCEPHIN) 1 g in dextrose 5 % 50 mL IVPB (1 g Intravenous Transfusing/Transfer 10/02/16 1507)  sodium chloride 0.9 % bolus 500 mL (0 mLs Intravenous Stopped 10/02/16 1332)  0.9 %  sodium chloride infusion ( Intravenous Transfusing/Transfer 10/02/16 1507)   CRITICAL CARE Performed by: Marcha Dutton, Constanza Mincy L Total critical care time: 40 minutes Critical care time was exclusive of separately billable procedures and treating other patients. Critical care was necessary to treat or prevent imminent or life-threatening deterioration. Critical care was time spent personally by me on the following activities: development of treatment plan with patient and/or surrogate as well as nursing, discussions with consultants, evaluation of patient's response to treatment, examination of patient, obtaining history from patient or surrogate, ordering and performing treatments and interventions, ordering and review of laboratory studies, ordering and review of radiographic studies, pulse oximetry and re-evaluation of  patient's condition.  Initial Impression / Assessment and Plan / ED Course  I have reviewed the triage vital signs and  the nursing notes.  Pertinent labs & imaging results that were available during my care of the patient were reviewed by me and considered in my medical decision making (see chart for details).    1:42 PM  D/w [facility], Dr. Jamse Arn for admission to hospitalist service at [facility].  She advised 200cc/hour of IV fluids while insulin is running.  Pt has received 500cc NS bolus- no signs of fluid overload.    2:55 PM  UTI on UA- first dose of rocephin ordered.    Pt presenting with c/o generalized weakness and fatigue.  Pt found to have hyperglycemia-  Not in dka- most c/w HHNK.  Pt given IV fluid bolus and started on insulin drip.  CXR and head CT are reassuring as well as EKG without changes.  Urine obtained via cath specimen and does appear c/w UTI- urine culture ordered and patient started on rocephin IV prior to transfer.  Other vital signs are not c/w sepsis.  Pt and husband are agreeable with this plan.    Final Clinical Impressions(s) / ED Diagnoses   Final diagnoses:  Hyperglycemic hyperosmolar nonketotic coma ([place])    New Prescriptions New Prescriptions   No medications on file     Pixie Casino, MD 10/02/16 1536

## 2016-10-02 NOTE — ED Notes (Signed)
Report to Caryl Pina, Therapist, sports at [employer] [facility]

## 2016-10-02 NOTE — ED Notes (Signed)
Attempted x 2 to start IV, unsuccessful.

## 2016-10-02 NOTE — Progress Notes (Signed)
Pre visit review using our clinic review tool, if applicable. No additional management support is needed unless otherwise documented below in the visit note. 
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
## 2016-10-03 DIAGNOSIS — E1101 Type 2 diabetes mellitus with hyperosmolarity with coma: Secondary | ICD-10-CM | POA: Diagnosis not present

## 2016-10-03 DIAGNOSIS — N390 Urinary tract infection, site not specified: Secondary | ICD-10-CM | POA: Diagnosis not present

## 2016-10-03 LAB — BASIC METABOLIC PANEL
ANION GAP: 12 (ref 5–15)
BUN: 40 mg/dL — ABNORMAL HIGH (ref 6–20)
CHLORIDE: 97 mmol/L — AB (ref 101–111)
CO2: 28 mmol/L (ref 22–32)
Calcium: 8.8 mg/dL — ABNORMAL LOW (ref 8.9–10.3)
Creatinine, Ser: 1.88 mg/dL — ABNORMAL HIGH (ref 0.44–1.00)
GFR calc non Af Amer: 25 mL/min — ABNORMAL LOW (ref >60)
GFR, EST AFRICAN AMERICAN: 29 mL/min — AB (ref >60)
Glucose, Bld: 163 mg/dL — ABNORMAL HIGH (ref 65–99)
POTASSIUM: 3.3 mmol/L — AB (ref 3.5–5.1)
SODIUM: 137 mmol/L (ref 135–145)

## 2016-10-03 LAB — GLUCOSE, CAPILLARY
GLUCOSE-CAPILLARY: 185 mg/dL — AB (ref 65–99)
GLUCOSE-CAPILLARY: 211 mg/dL — AB (ref 65–99)
GLUCOSE-CAPILLARY: 209 mg/dL — AB (ref 65–99)
Glucose-Capillary: 106 mg/dL — ABNORMAL HIGH (ref 65–99)
Glucose-Capillary: 133 mg/dL — ABNORMAL HIGH (ref 65–99)
Glucose-Capillary: 134 mg/dL — ABNORMAL HIGH (ref 65–99)
Glucose-Capillary: 147 mg/dL — ABNORMAL HIGH (ref 65–99)

## 2016-10-03 LAB — PROTIME-INR
INR: 2.17
PROTHROMBIN TIME: 24.6 s — AB (ref 11.4–15.2)

## 2016-10-03 LAB — MRSA PCR SCREENING: MRSA BY PCR: NEGATIVE

## 2016-10-03 MED ORDER — INSULIN ASPART PROT & ASPART (70-30 MIX) 100 UNIT/ML ~~LOC~~ SUSP
11.0000 [IU] | Freq: Every day | SUBCUTANEOUS | Status: DC
  Filled 2016-10-03: qty 10

## 2016-10-03 MED ORDER — FOSFOMYCIN TROMETHAMINE 3 G PO PACK
3.0000 g | PACK | Freq: Once | ORAL | Status: AC
  Administered 2016-10-03: 3 g via ORAL
  Filled 2016-10-03: qty 3

## 2016-10-03 MED ORDER — INSULIN ASPART 100 UNIT/ML ~~LOC~~ SOLN
0.0000 [IU] | Freq: Three times a day (TID) | SUBCUTANEOUS | Status: DC
  Administered 2016-10-03 (×2): 5 [IU] via SUBCUTANEOUS

## 2016-10-03 MED ORDER — FOSFOMYCIN TROMETHAMINE 3 G PO PACK: 3.0000 g | PACK | Freq: Once | ORAL | 0 refills | Status: DC

## 2016-10-03 MED ORDER — INSULIN ASPART PROT & ASPART (70-30 MIX) 100 UNIT/ML ~~LOC~~ SUSP
26.0000 [IU] | Freq: Every day | SUBCUTANEOUS | Status: DC
  Administered 2016-10-03: 26 [IU] via SUBCUTANEOUS
  Filled 2016-10-03: qty 10
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
## 2016-10-04 LAB — URINE CULTURE: Culture: >=100000 — AB

## 2016-10-05 ENCOUNTER — Telehealth: Payer: Self-pay

## 2016-10-05 ENCOUNTER — Other Ambulatory Visit: Payer: Self-pay

## 2016-10-05 MED ORDER — SULFAMETHOXAZOLE-TRIMETHOPRIM 800-160 MG PO TABS: 1.0000 | ORAL_TABLET | Freq: Two times a day (BID) | ORAL | 0 refills | Status: DC

## 2016-10-05 NOTE — Telephone Encounter (Signed)
Per patient appointment needs to be scheduled on or before the 10th of August. Scheduled for August the 10 th at  9;00am

## 2016-10-05 NOTE — Telephone Encounter (Signed)
10/05/16  TCM Hospital Follow Up  Transition Care Management Follow-up Telephone Call  ADMISSION DATE: 10/02/2016 DISCHARGE DATE: 10/03/2016   How have you been since you were released from the hospital?  Feeling great per patient   Do you understand why you were in the hospital? Yes   Do you understand the discharge instrcutions? Yes    Items Reviewed:   Medications reviewed:  Yes  Allergies reviewed: Yes   Dietary changes reviewed: Carbohydrate modified   Referrals reviewed: Yes   Functional Questionnaire:   Activities of Daily Living (ADLs): Performs all uses walker for ambulation.  Any patient concerns? No   Confirmed importance and date/time of follow-up visits scheduled:Yes   Confirmed with patient if condition begins to worsen call PCP or go to the ER. Yes    Patient was given the office number and encouragred to call back with questions or concerns. Yes

## 2016-10-05 NOTE — Assessment & Plan Note (Signed)
Headache, dizziness, feeling poorly: Patient with multiple symptoms, she is not toxic but doesn't look well. CBG at the office: Unable to read, suspect is very high. Last  insulin shot was last night. She needs further evaluation, to rule out a infection process, DKA,  possibly do a CT head given HA. Recent fall and chest injury: Has a ecchymoses. Case discussed with the ER physician who agreed to see the patient.

## 2016-10-05 NOTE — Telephone Encounter (Signed)
[facility] scheduled for 10/09/16 @ 9:00 with E. Saguier PA-C.

## 2016-10-05 NOTE — Telephone Encounter (Signed)
PCP on vacation 8/23-9/4, schedule is tight, if necessary, she may need to see another provider in office.

## 2016-10-06 ENCOUNTER — Ambulatory Visit (INDEPENDENT_AMBULATORY_CARE_PROVIDER_SITE_OTHER): Payer: Medicare Other | Admitting: *Deleted

## 2016-10-06 DIAGNOSIS — I4892 Unspecified atrial flutter: Secondary | ICD-10-CM | POA: Diagnosis not present

## 2016-10-06 DIAGNOSIS — I4891 Unspecified atrial fibrillation: Secondary | ICD-10-CM | POA: Diagnosis not present

## 2016-10-06 DIAGNOSIS — I2510 Atherosclerotic heart disease of native coronary artery without angina pectoris: Secondary | ICD-10-CM | POA: Diagnosis not present

## 2016-10-06 DIAGNOSIS — Z5181 Encounter for therapeutic drug level monitoring: Secondary | ICD-10-CM | POA: Diagnosis not present

## 2016-10-06 DIAGNOSIS — Z7901 Long term (current) use of anticoagulants: Secondary | ICD-10-CM | POA: Diagnosis not present

## 2016-10-06 LAB — POCT INR: INR: 2.3

## 2016-10-07 NOTE — Telephone Encounter (Signed)
Needs to be seen  Received: Gamaliel, MD  Damita Dunnings, [place]: Colon Branch, MD        Please change her TCM appointment, I can see her Thursday at 43.

## 2016-10-07 NOTE — Telephone Encounter (Signed)
Shiquita- Pt has appt w/ Percell Miller on Friday for hosp f/u- Larose Kells has cancellation on Thursday, can you call Pt to see if she can come tomorrow at 11 or 1130.

## 2016-10-07 NOTE — Telephone Encounter (Signed)
Pt's apt has been changed to w/PCP.

## 2016-10-08 ENCOUNTER — Encounter: Payer: Self-pay | Admitting: Internal Medicine

## 2016-10-08 ENCOUNTER — Ambulatory Visit (INDEPENDENT_AMBULATORY_CARE_PROVIDER_SITE_OTHER): Payer: Medicare Other | Admitting: Internal Medicine

## 2016-10-08 VITALS — BP 128/78 | HR 67 | Temp 98.1°F | Ht 69.0 in | Wt 186.0 lb

## 2016-10-08 DIAGNOSIS — R7989 Other specified abnormal findings of blood chemistry: Secondary | ICD-10-CM

## 2016-10-08 DIAGNOSIS — E1159 Type 2 diabetes mellitus with other circulatory complications: Secondary | ICD-10-CM

## 2016-10-08 DIAGNOSIS — R319 Hematuria, unspecified: Secondary | ICD-10-CM | POA: Diagnosis not present

## 2016-10-08 DIAGNOSIS — N390 Urinary tract infection, site not specified: Secondary | ICD-10-CM | POA: Diagnosis not present

## 2016-10-08 LAB — BASIC METABOLIC PANEL
BUN: 25 mg/dL — AB (ref 6–23)
CALCIUM: 9.9 mg/dL (ref 8.4–10.5)
CO2: 30 mEq/L (ref 19–32)
Chloride: 93 mEq/L — ABNORMAL LOW (ref 96–112)
Creatinine, Ser: 2.13 mg/dL — ABNORMAL HIGH (ref 0.40–1.20)
GFR: 24.02 mL/min — AB (ref >60.00)
GLUCOSE: 226 mg/dL — AB (ref 70–99)
Potassium: 3.4 mEq/L — ABNORMAL LOW (ref 3.5–5.1)
SODIUM: 133 meq/L — AB (ref 135–145)

## 2016-10-08 MED ORDER — PANTOPRAZOLE SODIUM 40 MG PO TBEC: 40.0000 mg | DELAYED_RELEASE_TABLET | Freq: Every day | ORAL | 3 refills | Status: DC

## 2016-10-08 NOTE — Progress Notes (Signed)
Subjective:    Patient ID: [name] [name], female    DOB: Aug 17, 1940, 76 y.o.   MRN: [id number]  DOS:  10/08/2016 Type of visit - description : TCM 7 Interval history: Admitted to the hospital and discharge 10/03/2016 Main diagnosis was nonketotic hyperglycemic syndrome. Was treated with IV fluids and insulin. Was found to have a UTI, treated with fosfomycin but later I switched her to Bactrim based on UCX Kidney function was decreased but improved after IV fluids  Review of Systems Since she left the hospital, she is taking the medications as prescribed and feels well. Checking her blood sugars twice a day. Denies fever chills No dysuria or gross hematuria Had nausea one time yesterday and has nausea episodically but no vomiting or diarrhea.   Past Medical History:  Diagnosis Date  . Atrial fibrillation ([place])    SVT dx 2007, cath 2007 mild CAD, then had an cardioversion, ablation; still on coumadin , has occ palpitation, EKG 03-2010 NSR  . Blindness of left eye   . CKD (chronic kidney disease), stage III 06/16/2016   [name] 06/17/2016  . DIABETES MELLITUS, TYPE II 03/27/2006   dr [name]  . Eye muscle weakness    Right eye weakness after cataract surgery  . GERD (gastroesophageal reflux disease) 10/05/2011  . 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 HYPERLIPIDEMIA 03/27/2006  . HYPERTENSION 03/27/2006  . Intertrochanteric fracture of right hip ([place]) 07/13/2012  . 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 Osteopenia 2004   Dexa 2004 showed Osteopenia, DEXA 03/2007 normal  . Osteopenia   . Other chronic cystitis with hematuria   . Pelvic fracture ([place]) 06/16/2016   S/P fall  . Recurrent urinary tract infection    Seeing Urology  . 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 Seizures ([place])   . 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[place]) 1/91/4782    Past Surgical History:  Procedure Laterality Date  . ANKLE FRACTURE SURGERY Bilateral 08/2015   "right  one was in 2 places"  . CARDIAC CATHETERIZATION N/A 05/17/2015   Procedure: Left Heart Cath and Coronary Angiography;  Surgeon: Jettie Booze, MD;  Location: [facility];  Service: Cardiovascular;  Laterality: N/A;  . CARDIAC CATHETERIZATION N/A 05/17/2015   Procedure: Coronary Balloon Angioplasty;  Surgeon: Jettie Booze, MD;  Location: [facility];  Service: Cardiovascular;  Laterality: N/A;  . FEMUR IM NAIL Right 07/15/2012   Procedure: INTRAMEDULLARY (IM) NAIL HIP;  Surgeon: Johnny Bridge, MD;  Location: [facility];  Service: Orthopedics;  Laterality: Right;  . FEMUR IM NAIL Left 12/02/2015   Procedure: INTRAMEDULLARY (IM) NAIL FEMORAL;  Surgeon: [name], MD;  Location: [facility];  Service: Orthopedics;  Laterality: Left;  . FRACTURE SURGERY    . INCISION AND DRAINAGE HIP Left 01/03/2016   Procedure: IRRIGATION AND DEBRIDEMENT HIP;  Surgeon: [name], MD;  Location: [facility];  Service: Orthopedics;  Laterality: Left;  . SHOULDER SURGERY Bilateral    "don't remember which side"    Social History   Social History  . Marital status: Married    Spouse name: Jenny Reichmann  . Number of children: 1  . Years of education: College   Occupational History  . retired  Retired   Social History Main Topics  . Smoking status: Former Smoker    Types: Cigarettes    Quit date: 03/02/1978  . Smokeless tobacco: Never Used  .  Alcohol use No  . Drug use: No  . Sexual activity: No   Other Topics Concern  . Not on file   Social History Narrative   Patient lives at home spouse. Uses a walker consistently.   Moved from [place] 2004   1 son, problems w/ drugs, passed away 02-29-2016-- OD          Allergies as of 10/08/2016      Reactions   Procaine Hcl Anaphylaxis   Amoxicillin Itching      Medication List       Accurate as of 10/08/16 11:59 PM. Always use your most recent med list.          atorvastatin 80 MG tablet Commonly known as:  LIPITOR Take 1 tablet (80 mg total) by  mouth daily.   calcium-vitamin D 500-200 MG-UNIT tablet Commonly known as:  OSCAL WITH D Take 1 tablet by mouth daily.   clopidogrel 75 MG tablet Commonly known as:  PLAVIX Take 1 tablet (75 mg total) by mouth daily.   escitalopram 10 MG tablet Commonly known as:  LEXAPRO Take 2 tablets (20 mg total) by mouth daily.   furosemide 40 MG tablet Commonly known as:  LASIX Take 1 tablet (40 mg total) by mouth 2 (two) times daily as needed for fluid or edema.   insulin NPH-regular Human (70-30) 100 UNIT/ML injection Commonly known as:  NOVOLIN 70/30 32 units with breakfast and 18 units with the evening meal   metoprolol succinate 25 MG 24 hr tablet Commonly known as:  TOPROL-XL Take 1 tablet (25 mg total) by mouth daily. Take with or immediately following a meal.   MULTIVITAMIN ADULT PO Take 1 tablet by mouth daily with breakfast.   pantoprazole 40 MG tablet Commonly known as:  PROTONIX Take 1 tablet (40 mg total) by mouth daily.   promethazine 12.5 MG tablet Commonly known as:  PHENERGAN Take 1 tablet (12.5 mg total) by mouth every 6 (six) hours as needed for nausea or vomiting.   sulfamethoxazole-trimethoprim 800-160 MG tablet Commonly known as:  BACTRIM DS,SEPTRA DS Take 1 tablet by mouth 2 (two) times daily.   triamcinolone ointment 0.5 % Commonly known as:  KENALOG Apply 1 application topically 4 (four) times daily. As needed for itching   warfarin 2.5 MG tablet Commonly known as:  COUMADIN Take 1 tablet (2.5 mg total) by mouth daily. Adjust dose as directed by coumadin clinic          Objective:   Physical Exam BP 128/78 (BP Location: Left Arm, Patient Position: Sitting, Cuff Size: Normal)   Pulse 67   Temp 98.1 F (36.7 C) (Oral)   Ht 5\' 9"  (1.753 m)   Wt 186 lb (84.4 kg)   SpO2 98%   BMI 27.47 kg/m  General:   Well developed, well nourished . NAD.  HEENT:  Normocephalic . Face symmetric, atraumatic Lungs:  CTA B Normal respiratory effort, no  intercostal retractions, no accessory muscle use. Heart: RRR,  no murmur.  No pretibial edema bilaterally  Abdomen: Soft, nontender Skin: Not pale. Not jaundice Neurologic:  alert & oriented X3.  Speech normal, gait appropriate for age and unassisted Psych--  Cognition and judgment appear intact.  Cooperative with normal attention span and concentration.  Behavior appropriate. No anxious or depressed appearing.      Assessment & Plan:   Assessment   DM  + retinopathy, Dr [name] HTN Hyperlipidemia anxiety ativan GERD Elevated creatinine(started after admission 05-2015, had IV contrast)  Anemia, normal iron 05-2015, never had a colonoscopy CV ---NSTEMI 05-2015, cath, angiplasty ---Echo, EF 40 % (05-2015) ---Atrial fibrillation, SVT -- dx 2007, cath 2007 mild CAD. 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), sz episode 08-2015 (?) - HAs Blindness, left eye Right eye weakness after cataract surgery Osteopenia per DEXA 2004, DEXA 2009 normal, DEXA 2015 @ [facility] -1.8 H/o lung bx 2008 benign  05-2015- admitted [place] sepsis  08-2015 admitted  B tibular Fx, no surgery, SNF 12-2015:   admitted, hip FX , surgery 01-2016: admitted, hip surgery infex   05-2016: Admitted, fall, pelvic fracture, d/c to home 10-02-16: admitted  UTI, nonketotic hyperglycemic syndrome   PLAN: DM: Resolved nonketotic hyperglycemic syndrome. Prior to this problem, she was taking insulin as prescribed, triggered by a UTI?[name] Check a BMP. Currently on 70/30 insulin 25 u AM, 11 u PM. CBGs a.m.: 227, 250. CBGs p.m. 180. Plan: Increase insulin to 32 units a.m., 18 units p.m. Watch for hypoglycemia,  UTI: Currently on Bactrim, she remains asymptomatic (never had sxs). Will check a urine culture next week to be sure  infection is eradicated. Going to [place] in about a week to see her new grandbaby, okay to go if feels well. Seek medical attention immediately if she feels unwell. RTC 3 months

## 2016-10-08 NOTE — Progress Notes (Signed)
Pre visit review using our clinic review tool, if applicable. No additional management support is needed unless otherwise documented below in the visit note. 

## 2016-10-08 NOTE — Patient Instructions (Signed)
GO TO THE LAB : Get the blood work     GO TO THE FRONT DESK Schedule your next appointment for a  checkup with me in 3-4 months  Come back next week and provide a urine sample  Continue the same medication except the insulin 70/30: Increase to 32 units in the morning and 18 in the afternoon. Don't skip meals Call me or Dr. [name] if your sugars are less than 120  Seek medical attention when you are in  [place] if you feel unwell

## 2016-10-09 ENCOUNTER — Inpatient Hospital Stay: Payer: Medicare Other | Admitting: Medical

## 2016-10-09 NOTE — Assessment & Plan Note (Signed)
DM: Resolved nonketotic hyperglycemic syndrome. Prior to this problem, she was taking insulin as prescribed, triggered by a UTI?[name] Check a BMP. Currently on 70/30 insulin 25 u AM, 11 u PM. CBGs a.m.: 227, 250. CBGs p.m. 180. Plan: Increase insulin to 32 units a.m., 18 units p.m. Watch for hypoglycemia,  UTI: Currently on Bactrim, she remains asymptomatic (never had sxs). Will check a urine culture next week to be sure infection is eradicated. Going to [place] in about a week to see her new grandbaby, okay to go if feels well. Seek medical attention immediately if she feels unwell. RTC 3 months

## 2016-10-13 NOTE — Addendum Note (Signed)
Addended byDamita Dunnings D on: 10/13/2016 01:27 PM   Modules accepted: Orders

## 2016-10-15 ENCOUNTER — Other Ambulatory Visit (INDEPENDENT_AMBULATORY_CARE_PROVIDER_SITE_OTHER): Payer: Medicare Other

## 2016-10-15 DIAGNOSIS — N390 Urinary tract infection, site not specified: Secondary | ICD-10-CM

## 2016-10-15 DIAGNOSIS — R319 Hematuria, unspecified: Secondary | ICD-10-CM | POA: Diagnosis not present

## 2016-10-15 LAB — URINALYSIS, ROUTINE W REFLEX MICROSCOPIC
Bilirubin Urine: NEGATIVE
HGB URINE DIPSTICK: NEGATIVE
Ketones, ur: NEGATIVE
Nitrite: NEGATIVE
Specific Gravity, Urine: 1.01 (ref 1.000–1.030)
TOTAL PROTEIN, URINE-UPE24: NEGATIVE
UROBILINOGEN UA: 0.2 (ref 0.0–1.0)
Urine Glucose: NEGATIVE
pH: 7 (ref 5.0–8.0)

## 2016-10-19 LAB — URINE CULTURE

## 2016-10-23 ENCOUNTER — Ambulatory Visit (INDEPENDENT_AMBULATORY_CARE_PROVIDER_SITE_OTHER): Payer: Medicare Other | Admitting: *Deleted

## 2016-10-23 DIAGNOSIS — I2510 Atherosclerotic heart disease of native coronary artery without angina pectoris: Secondary | ICD-10-CM | POA: Diagnosis not present

## 2016-10-23 DIAGNOSIS — Z7901 Long term (current) use of anticoagulants: Secondary | ICD-10-CM

## 2016-10-23 DIAGNOSIS — I4892 Unspecified atrial flutter: Secondary | ICD-10-CM

## 2016-10-23 DIAGNOSIS — Z5181 Encounter for therapeutic drug level monitoring: Secondary | ICD-10-CM

## 2016-10-23 DIAGNOSIS — I4891 Unspecified atrial fibrillation: Secondary | ICD-10-CM

## 2016-10-23 LAB — POCT INR: INR: 1.5

## 2016-10-26 ENCOUNTER — Encounter: Payer: Self-pay | Admitting: Endocrinology

## 2016-10-26 ENCOUNTER — Ambulatory Visit (INDEPENDENT_AMBULATORY_CARE_PROVIDER_SITE_OTHER): Payer: Medicare Other | Admitting: Endocrinology

## 2016-10-26 VITALS — BP 112/58 | HR 61 | Wt 185.8 lb

## 2016-10-26 DIAGNOSIS — E1159 Type 2 diabetes mellitus with other circulatory complications: Secondary | ICD-10-CM

## 2016-10-26 DIAGNOSIS — I2510 Atherosclerotic heart disease of native coronary artery without angina pectoris: Secondary | ICD-10-CM | POA: Diagnosis not present

## 2016-10-26 LAB — POCT GLYCOSYLATED HEMOGLOBIN (HGB A1C): Hemoglobin A1C: 10.2

## 2016-10-26 MED ORDER — INSULIN NPH ISOPHANE & REGULAR (70-30) 100 UNIT/ML ~~LOC~~ SUSP: SUBCUTANEOUS | 11 refills | Status: DC

## 2016-10-26 NOTE — Patient Instructions (Addendum)
Please continue the same insulin.   On this type of insulin schedule, you should eat meals on a regular schedule.  If a meal is missed or significantly delayed, your blood sugar could go low.   Please come back for a follow-up appointment in 1 month.   check your blood sugar twice a day.  vary the time of day when you check, between before the 3 meals, and at bedtime.  also check if you have symptoms of your blood sugar being too high or too low.  please keep a record of the readings and bring it to your next appointment here.  please call us sooner if your blood sugar goes below 70, or if you have a lot of readings over 200.   Here are some papers to write it on.

## 2016-10-26 NOTE — Progress Notes (Signed)
Subjective:    Patient ID: [name] [name], female    DOB: 1940/11/02, 76 y.o.   MRN: [id number]  HPI Pt returns for f/u of diabetes mellitus: DM type: Insulin-requiring type 2 Dx'ed: 4481 Complications: polyneuropathy, CAD, and retinopathy.  Therapy: insulin since soon after dx.  GDM: never.  DKA: never.  Severe hypoglycemia: last episode was in 2016.  Pancreatitis: never.   Other: she is on BID premixed insulin, due to noncompliance with multiple daily injections.  She takes human insulin, due to cost.  Interval history: She takes 28 qam and 12 units qpm.  no cbg record, but states cbg's vary from 93-700.  It is in general higher as the day goes on.  2 weeks ago, she had an episode of severe hypoglycemia at noon.  She is unable to cite any reason for this, such as a missed meal.   Past Medical History:  Diagnosis Date  . Atrial fibrillation ([place])    SVT dx 2007, cath 2007 mild CAD, then had an cardioversion, ablation; still on coumadin , has occ palpitation, EKG 03-2010 NSR  . Blindness of left eye   . CKD (chronic kidney disease), stage III 06/16/2016   [name] 06/17/2016  . DIABETES MELLITUS, TYPE II 03/27/2006   dr [name]  . Eye muscle weakness    Right eye weakness after cataract surgery  . GERD (gastroesophageal reflux disease) 10/05/2011  . 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 HYPERLIPIDEMIA 03/27/2006  . HYPERTENSION 03/27/2006  . Intertrochanteric fracture of right hip ([place]) 07/13/2012  . 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 Osteopenia 2004   Dexa 2004 showed Osteopenia, DEXA 03/2007 normal  . Osteopenia   . Other chronic cystitis with hematuria   . Pelvic fracture ([place]) 06/16/2016   S/P fall  . Recurrent urinary tract infection    Seeing Urology  . 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 Seizures ([place])   . 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[place]) 8/56/3149    Past Surgical History:  Procedure Laterality Date  . ANKLE FRACTURE  SURGERY Bilateral 08/2015   "right one was in 2 places"  . CARDIAC CATHETERIZATION N/A 05/17/2015   Procedure: Left Heart Cath and Coronary Angiography;  Surgeon: Jettie Booze, MD;  Location: [facility];  Service: Cardiovascular;  Laterality: N/A;  . CARDIAC CATHETERIZATION N/A 05/17/2015   Procedure: Coronary Balloon Angioplasty;  Surgeon: Jettie Booze, MD;  Location: [facility];  Service: Cardiovascular;  Laterality: N/A;  . FEMUR IM NAIL Right 07/15/2012   Procedure: INTRAMEDULLARY (IM) NAIL HIP;  Surgeon: Johnny Bridge, MD;  Location: [facility];  Service: Orthopedics;  Laterality: Right;  . FEMUR IM NAIL Left 12/02/2015   Procedure: INTRAMEDULLARY (IM) NAIL FEMORAL;  Surgeon: [name], MD;  Location: [facility];  Service: Orthopedics;  Laterality: Left;  . FRACTURE SURGERY    . INCISION AND DRAINAGE HIP Left 01/03/2016   Procedure: IRRIGATION AND DEBRIDEMENT HIP;  Surgeon: [name], MD;  Location: [facility];  Service: Orthopedics;  Laterality: Left;  . SHOULDER SURGERY Bilateral    "don't remember which side"    Social History   Social History  . Marital status: Married    Spouse name: Jenny Reichmann  . Number of children: 1  . Years of education: College   Occupational History  . retired  Retired   Social History Main Topics  . Smoking status: Former Smoker  Types: Cigarettes    Quit date: 03/02/1978  . Smokeless tobacco: Never Used  . Alcohol use No  . Drug use: No  . Sexual activity: No   Other Topics Concern  . Not on file   Social History Narrative   Patient lives at home spouse. Uses a walker consistently.   Moved from [place] 2004   1 son, problems w/ drugs, passed away 25-Feb-2016-- OD        Current Outpatient Prescriptions on File Prior to Visit  Medication Sig Dispense Refill  . atorvastatin (LIPITOR) 80 MG tablet Take 1 tablet (80 mg total) by mouth daily. 90 tablet 1  . calcium-vitamin D (OSCAL WITH D) 500-200 MG-UNIT per tablet Take 1 tablet by  mouth daily.    . clopidogrel (PLAVIX) 75 MG tablet Take 1 tablet (75 mg total) by mouth daily. 90 tablet 1  . escitalopram (LEXAPRO) 10 MG tablet Take 2 tablets (20 mg total) by mouth daily. 180 tablet 1  . furosemide (LASIX) 40 MG tablet Take 1 tablet (40 mg total) by mouth 2 (two) times daily as needed for fluid or edema. 180 tablet 1  . metoprolol succinate (TOPROL-XL) 25 MG 24 hr tablet Take 1 tablet (25 mg total) by mouth daily. Take with or immediately following a meal. 90 tablet 1  . Multiple Vitamins-Minerals (MULTIVITAMIN ADULT PO) Take 1 tablet by mouth daily with breakfast.     . pantoprazole (PROTONIX) 40 MG tablet Take 1 tablet (40 mg total) by mouth daily. 90 tablet 3  . sulfamethoxazole-trimethoprim (BACTRIM DS,SEPTRA DS) 800-160 MG tablet Take 1 tablet by mouth 2 (two) times daily. 6 tablet 0  . warfarin (COUMADIN) 2.5 MG tablet Take 1 tablet (2.5 mg total) by mouth daily. Adjust dose as directed by coumadin clinic 75 tablet 2  . promethazine (PHENERGAN) 12.5 MG tablet Take 1 tablet (12.5 mg total) by mouth every 6 (six) hours as needed for nausea or vomiting. (Patient not taking: Reported on 10/26/2016) 30 tablet 1  . triamcinolone ointment (KENALOG) 0.5 % Apply 1 application topically 4 (four) times daily. As needed for itching (Patient not taking: Reported on 10/26/2016) 30 g 0   No current facility-administered medications on file prior to visit.     Allergies  Allergen Reactions  . Procaine Hcl Anaphylaxis  . Amoxicillin Itching    Family History  Problem Relation Age of Onset  . Diabetes Father   . Heart attack Father 36  . Diabetes Sister   . Drug abuse Son   . Cancer Neg Hx        no hx of colon or breast cancer    BP (!) 112/58   Pulse 61   Wt 185 lb 12.8 oz (84.3 kg)   SpO2 96%   BMI 27.44 kg/m   Review of Systems She denies hypoglycemia.      Objective:   Physical Exam VITAL SIGNS:  See vs page GENERAL: no distress Pulses: foot pulses are intact  bilaterally.   MSK: no deformity of the feet or ankles.  CV: no edema of the legs or ankles Skin:  no ulcer on the feet or ankles.  normal color and temp on the feet and ankles Neuro: sensation is intact to touch on the feet and ankles.   Ext: There is bilateral onychomycosis of the toenails.   Lab Results  Component Value Date   HGBA1C 10.2 10/26/2016       Assessment & Plan:  Insulin-requiring type 2 DM,  with DR: worse.   Severe hypoglycemia, uncertain etiology: we can't safely increase insulin until we have more cbg info.   Patient Instructions  Please continue the same insulin.   On this type of insulin schedule, you should eat meals on a regular schedule.  If a meal is missed or significantly delayed, your blood sugar could go low.   Please come back for a follow-up appointment in 1 month.   check your blood sugar twice a day.  vary the time of day when you check, between before the 3 meals, and at bedtime.  also check if you have symptoms of your blood sugar being too high or too low.  please keep a record of the readings and bring it to your next appointment here.  please call us sooner if your blood sugar goes below 70, or if you have a lot of readings over 200.   Here are some papers to write it on.

## 2016-10-28 ENCOUNTER — Telehealth: Payer: Self-pay | Admitting: Endocrinology

## 2016-10-28 NOTE — Telephone Encounter (Signed)
Called to check on insulin NPH-regular Human (NOVOLIN 70/30) (70-30) 100 UNIT/ML injection rx however, she misread the note, no further action required, pharmacy is filling the rx.

## 2016-10-29 ENCOUNTER — Other Ambulatory Visit: Payer: Self-pay

## 2016-10-29 MED ORDER — INSULIN NPH ISOPHANE & REGULAR (70-30) 100 UNIT/ML ~~LOC~~ SUSP: SUBCUTANEOUS | 11 refills | Status: DC

## 2016-11-05 ENCOUNTER — Ambulatory Visit (INDEPENDENT_AMBULATORY_CARE_PROVIDER_SITE_OTHER): Payer: Medicare Other | Admitting: *Deleted

## 2016-11-05 DIAGNOSIS — Z7901 Long term (current) use of anticoagulants: Secondary | ICD-10-CM

## 2016-11-05 DIAGNOSIS — I482 Chronic atrial fibrillation, unspecified: Secondary | ICD-10-CM

## 2016-11-05 DIAGNOSIS — I4891 Unspecified atrial fibrillation: Secondary | ICD-10-CM | POA: Diagnosis not present

## 2016-11-05 DIAGNOSIS — I4892 Unspecified atrial flutter: Secondary | ICD-10-CM | POA: Diagnosis not present

## 2016-11-05 DIAGNOSIS — Z5181 Encounter for therapeutic drug level monitoring: Secondary | ICD-10-CM

## 2016-11-05 LAB — POCT INR: INR: 2.7

## 2016-11-09 ENCOUNTER — Encounter: Payer: Self-pay | Admitting: Internal Medicine

## 2016-11-09 ENCOUNTER — Encounter (INDEPENDENT_AMBULATORY_CARE_PROVIDER_SITE_OTHER): Payer: Medicare Other | Admitting: Internal Medicine

## 2016-11-09 DIAGNOSIS — R42 Dizziness and giddiness: Secondary | ICD-10-CM

## 2016-11-09 NOTE — Progress Notes (Deleted)
Pre visit review using our clinic review tool, if applicable. No additional management support is needed unless otherwise documented below in the visit note. 

## 2016-11-09 NOTE — Progress Notes (Signed)
Error

## 2016-11-10 ENCOUNTER — Ambulatory Visit: Payer: Medicare Other | Admitting: Podiatry

## 2016-11-14 ENCOUNTER — Telehealth: Payer: Self-pay | Admitting: Internal Medicine

## 2016-11-14 NOTE — Telephone Encounter (Signed)
Due for a BMP, please arrange. DX HTN

## 2016-11-16 NOTE — Telephone Encounter (Signed)
Letter mailed to Pt instructing to call office to schedule lab appt only. BMP already ordered.

## 2016-11-25 ENCOUNTER — Encounter: Payer: Self-pay | Admitting: Internal Medicine

## 2016-11-25 ENCOUNTER — Ambulatory Visit (INDEPENDENT_AMBULATORY_CARE_PROVIDER_SITE_OTHER): Payer: Medicare Other | Admitting: Internal Medicine

## 2016-11-25 ENCOUNTER — Ambulatory Visit (HOSPITAL_BASED_OUTPATIENT_CLINIC_OR_DEPARTMENT_OTHER)
Admission: RE | Admit: 2016-11-25 | Discharge: 2016-11-25 | Disposition: A | Discharge status: Home / Self Care | Payer: Medicare Other | Source: Ambulatory Visit | Attending: Internal Medicine | Admitting: Internal Medicine

## 2016-11-25 VITALS — BP 128/78 | HR 84 | Temp 98.2°F | Resp 14 | Ht 69.0 in | Wt 186.5 lb

## 2016-11-25 DIAGNOSIS — S6991XA Unspecified injury of right wrist, hand and finger(s), initial encounter: Secondary | ICD-10-CM

## 2016-11-25 DIAGNOSIS — X58XXXD Exposure to other specified factors, subsequent encounter: Secondary | ICD-10-CM | POA: Insufficient documentation

## 2016-11-25 DIAGNOSIS — I2510 Atherosclerotic heart disease of native coronary artery without angina pectoris: Secondary | ICD-10-CM

## 2016-11-25 DIAGNOSIS — N390 Urinary tract infection, site not specified: Secondary | ICD-10-CM | POA: Diagnosis not present

## 2016-11-25 DIAGNOSIS — I10 Essential (primary) hypertension: Secondary | ICD-10-CM | POA: Diagnosis not present

## 2016-11-25 DIAGNOSIS — R319 Hematuria, unspecified: Secondary | ICD-10-CM

## 2016-11-25 DIAGNOSIS — Z23 Encounter for immunization: Secondary | ICD-10-CM

## 2016-11-25 DIAGNOSIS — S62632D Displaced fracture of distal phalanx of right middle finger, subsequent encounter for fracture with routine healing: Secondary | ICD-10-CM | POA: Diagnosis not present

## 2016-11-25 LAB — BASIC METABOLIC PANEL
BUN: 27 mg/dL — ABNORMAL HIGH (ref 6–23)
CALCIUM: 9.7 mg/dL (ref 8.4–10.5)
CO2: 32 meq/L (ref 19–32)
Chloride: 96 mEq/L (ref 96–112)
Creatinine, Ser: 1.86 mg/dL — ABNORMAL HIGH (ref 0.40–1.20)
GFR: 28.07 mL/min — ABNORMAL LOW (ref >60.00)
GLUCOSE: 271 mg/dL — AB (ref 70–99)
Potassium: 4 mEq/L (ref 3.5–5.1)
SODIUM: 136 meq/L (ref 135–145)

## 2016-11-25 NOTE — Patient Instructions (Signed)
GO TO THE LAB : Get the blood work     STOP BY THE FIRST FLOOR:  get the XR    See you in November

## 2016-11-25 NOTE — Progress Notes (Signed)
Pre visit review using our clinic review tool, if applicable. No additional management support is needed unless otherwise documented below in the visit note. 

## 2016-11-25 NOTE — Progress Notes (Signed)
Subjective:    Patient ID: [name] [name], female    DOB: [birth year], 76 y.o.   MRN: [id number]  DOS:  11/25/2016 Type of visit - description : acute Interval history: 4 weeks ago, the right middle finger was caught in the car door, at that time there was no bleeding. He had severe pain and swelling. Since then the swelling is still there, the pain has decreased but she still has some pain from time to time. Also, the last time her potassium was a slightly low, due for a recheck. We also reviewed a urine culture results from 10/05/2016   Review of Systems No fever chills No nausea or vomiting No dysuria or gross hematuria  Past Medical History:  Diagnosis Date  . Atrial fibrillation ([place])    SVT dx 2007, cath 2007 mild CAD, then had an cardioversion, ablation; still on coumadin , has occ palpitation, EKG 03-2010 NSR  . Blindness of left eye   . CKD (chronic kidney disease), stage III 06/16/2016   [name] 06/17/2016  . DIABETES MELLITUS, TYPE II 03/27/2006   dr [name]  . Eye muscle weakness    Right eye weakness after cataract surgery  . GERD (gastroesophageal reflux disease) 10/05/2011  . 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 HYPERLIPIDEMIA 03/27/2006  . HYPERTENSION 03/27/2006  . Intertrochanteric fracture of right hip ([place]) 07/13/2012  . 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 Osteopenia 2004   Dexa 2004 showed Osteopenia, DEXA 03/2007 normal  . Osteopenia   . Other chronic cystitis with hematuria   . Pelvic fracture ([place]) 06/16/2016   S/P fall  . Recurrent urinary tract infection    Seeing Urology  . 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 Seizures ([place])   . 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[place]) 06/18/6220    Past Surgical History:  Procedure Laterality Date  . ANKLE FRACTURE SURGERY Bilateral 08/2015   "right one was in 2 places"  . CARDIAC CATHETERIZATION N/A 05/17/2015   Procedure: Left Heart Cath and Coronary Angiography;   Surgeon: Jettie Booze, MD;  Location: [facility];  Service: Cardiovascular;  Laterality: N/A;  . CARDIAC CATHETERIZATION N/A 05/17/2015   Procedure: Coronary Balloon Angioplasty;  Surgeon: Jettie Booze, MD;  Location: [facility];  Service: Cardiovascular;  Laterality: N/A;  . FEMUR IM NAIL Right 07/15/2012   Procedure: INTRAMEDULLARY (IM) NAIL HIP;  Surgeon: Johnny Bridge, MD;  Location: [facility];  Service: Orthopedics;  Laterality: Right;  . FEMUR IM NAIL Left 12/02/2015   Procedure: INTRAMEDULLARY (IM) NAIL FEMORAL;  Surgeon: [name], MD;  Location: [facility];  Service: Orthopedics;  Laterality: Left;  . FRACTURE SURGERY    . INCISION AND DRAINAGE HIP Left 01/03/2016   Procedure: IRRIGATION AND DEBRIDEMENT HIP;  Surgeon: [name], MD;  Location: [facility];  Service: Orthopedics;  Laterality: Left;  . SHOULDER SURGERY Bilateral    "don't remember which side"    Social History   Social History  . Marital status: Married    Spouse name: Jenny Reichmann  . Number of children: 1  . Years of education: College   Occupational History  . retired  Retired   Social History Main Topics  . Smoking status: Former Smoker    Types: Cigarettes    Quit date: 03/02/1978  . Smokeless tobacco: Never Used  . Alcohol use No  . Drug use: No  . Sexual  activity: No   Other Topics Concern  . Not on file   Social History Narrative   Patient lives at home spouse. Uses a walker consistently.   Moved from [place] 2004   1 son, problems w/ drugs, passed away 26-Feb-2016-- OD          Allergies as of 11/25/2016      Reactions   Procaine Hcl Anaphylaxis   Amoxicillin Itching      Medication List       Accurate as of 11/25/16 10:25 AM. Always use your most recent med list.          atorvastatin 80 MG tablet Commonly known as:  LIPITOR Take 1 tablet (80 mg total) by mouth daily.   calcium-vitamin D 500-200 MG-UNIT tablet Commonly known as:  OSCAL WITH D Take 1 tablet by mouth  daily.   clopidogrel 75 MG tablet Commonly known as:  PLAVIX Take 1 tablet (75 mg total) by mouth daily.   escitalopram 10 MG tablet Commonly known as:  LEXAPRO Take 2 tablets (20 mg total) by mouth daily.   furosemide 40 MG tablet Commonly known as:  LASIX Take 1 tablet (40 mg total) by mouth 2 (two) times daily as needed for fluid or edema.   insulin NPH-regular Human (70-30) 100 UNIT/ML injection Commonly known as:  NOVOLIN 70/30 RELION Used to inject 28 units subcutaneously with breakfast and then inject 12 units with evening meal.   metoprolol succinate 25 MG 24 hr tablet Commonly known as:  TOPROL-XL Take 1 tablet (25 mg total) by mouth daily. Take with or immediately following a meal.   MULTIVITAMIN ADULT PO Take 1 tablet by mouth daily with breakfast.   pantoprazole 40 MG tablet Commonly known as:  PROTONIX Take 1 tablet (40 mg total) by mouth daily.   promethazine 12.5 MG tablet Commonly known as:  PHENERGAN Take 1 tablet (12.5 mg total) by mouth every 6 (six) hours as needed for nausea or vomiting.   sulfamethoxazole-trimethoprim 800-160 MG tablet Commonly known as:  BACTRIM DS,SEPTRA DS Take 1 tablet by mouth 2 (two) times daily.   triamcinolone ointment 0.5 % Commonly known as:  KENALOG Apply 1 application topically 4 (four) times daily. As needed for itching   warfarin 2.5 MG tablet Commonly known as:  COUMADIN Take 1 tablet (2.5 mg total) by mouth daily. Adjust dose as directed by coumadin clinic          Objective:   Physical Exam BP 128/78 (BP Location: Left Arm, Patient Position: Sitting, Cuff Size: Small)   Pulse 84   Temp 98.2 F (36.8 C) (Oral)   Resp 14   Ht 5\' 9"  (1.753 m)   Wt 186 lb 8 oz (84.6 kg)   SpO2 94%   BMI 27.54 kg/m  General:   Well developed, well nourished . NAD.  HEENT:  Normocephalic . Face symmetric, atraumatic  MSK: Distal right middle finger slightly swollen, minimal erythema but not warmness. The pulp is soft  and nontender. Good capillary refill. Slightly TTP proximal from the nail matrix. No deformity Skin: Not pale. Not jaundice Neurologic:  alert & oriented X3.  Speech normal, gait appropriate for age and unassisted Psych--  Cognition and judgment appear intact.  Cooperative with normal attention span and concentration.  Behavior appropriate. No anxious or depressed appearing.      Assessment & Plan:  Assessment   DM  + retinopathy, Dr [name] HTN Hyperlipidemia anxiety ativan GERD Elevated creatinine(started after admission 05-2015,  had IV contrast) Anemia, normal iron 05-2015, never had a colonoscopy CV ---NSTEMI 05-2015, cath, angiplasty ---Echo, EF 40 % (05-2015) ---Atrial fibrillation, SVT -- dx 2007, cath 2007 mild CAD. 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), sz episode 08-2015 (?) - HAs Blindness, left eye Right eye weakness after cataract surgery Osteopenia per DEXA 2004, DEXA 2009 normal, DEXA 2015 @ [facility] -1.8 H/o lung bx 2008 benign  05-2015- admitted [place] sepsis  08-2015 admitted  B tibular Fx, no surgery, SNF 12-2015:   admitted, hip FX , surgery 01-2016: admitted, hip surgery infex   05-2016: Admitted, fall, pelvic fracture, d/c to home 10-02-16: admitted  UTI, nonketotic hyperglycemic syndrome   PLAN: Right middle finger injury:   injured 4 weeks ago, still having pain, no deformities. Get a x-ray, further advice w/result . Addendum: X-ray showed a fracture, is already healing, I talk w/ pt, rec a splinter , she know the DIP functionality may not be back 100%, and she accepted that, declined ortho referral HTN: Last potassium is slightly low, recheck today. UTI: on 10/15/2016, she had her third UCX + for klebsiella, she was admitted 10/02/2016 with severe  hyperglycemia and the culprit was felt to be a UTI however she did not have UTI symptoms then. She remains asx.Plan is not to treat with antibiotics at this point, this was informally discussed with ID before and they think that is reasonable. RTC November as a schedule

## 2016-11-26 ENCOUNTER — Ambulatory Visit (INDEPENDENT_AMBULATORY_CARE_PROVIDER_SITE_OTHER): Payer: Medicare Other | Admitting: *Deleted

## 2016-11-26 DIAGNOSIS — Z5181 Encounter for therapeutic drug level monitoring: Secondary | ICD-10-CM

## 2016-11-26 DIAGNOSIS — Z7901 Long term (current) use of anticoagulants: Secondary | ICD-10-CM | POA: Diagnosis not present

## 2016-11-26 DIAGNOSIS — I4892 Unspecified atrial flutter: Secondary | ICD-10-CM

## 2016-11-26 DIAGNOSIS — I4891 Unspecified atrial fibrillation: Secondary | ICD-10-CM

## 2016-11-26 LAB — POCT INR: INR: 2.3

## 2016-11-27 ENCOUNTER — Ambulatory Visit (INDEPENDENT_AMBULATORY_CARE_PROVIDER_SITE_OTHER): Payer: Medicare Other | Admitting: Endocrinology

## 2016-11-27 ENCOUNTER — Encounter: Payer: Self-pay | Admitting: Endocrinology

## 2016-11-27 VITALS — BP 98/50 | HR 74 | Wt 186.2 lb

## 2016-11-27 DIAGNOSIS — E1159 Type 2 diabetes mellitus with other circulatory complications: Secondary | ICD-10-CM | POA: Diagnosis not present

## 2016-11-27 DIAGNOSIS — I2510 Atherosclerotic heart disease of native coronary artery without angina pectoris: Secondary | ICD-10-CM

## 2016-11-27 NOTE — Progress Notes (Signed)
Subjective:    Patient ID: [name] [name], female    DOB: 11-24-1940, 76 y.o.   MRN: [id number]  HPI Pt returns for f/u of diabetes mellitus: DM type: Insulin-requiring type 2 Dx'ed: 7062 Complications: polyneuropathy, CAD, and retinopathy.  Therapy: insulin since soon after dx.  GDM: never.  DKA: never.  Severe hypoglycemia: last episode was in mid-2018.  Pancreatitis: never.   Other: she is on BID premixed insulin, due to noncompliance with multiple daily injections.  She takes human insulin, due to cost.  Interval history: she brings a record of her cbg's which I have reviewed today.  It varies from 89-422.  It is in general higher as the day goes on, but not necessarily so.  The fasting cbg varies varies very little (109-208), but the PM varies widely (89-422).  Past Medical History:  Diagnosis Date  . Atrial fibrillation ([place])    SVT dx 2007, cath 2007 mild CAD, then had an cardioversion, ablation; still on coumadin , has occ palpitation, EKG 03-2010 NSR  . Blindness of left eye   . CKD (chronic kidney disease), stage III 06/16/2016   [name] 06/17/2016  . DIABETES MELLITUS, TYPE II 03/27/2006   dr [name]  . Eye muscle weakness    Right eye weakness after cataract surgery  . GERD (gastroesophageal reflux disease) 10/05/2011  . 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 HYPERLIPIDEMIA 03/27/2006  . HYPERTENSION 03/27/2006  . Intertrochanteric fracture of right hip ([place]) 07/13/2012  . 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 Osteopenia 2004   Dexa 2004 showed Osteopenia, DEXA 03/2007 normal  . Osteopenia   . Other chronic cystitis with hematuria   . Pelvic fracture ([place]) 06/16/2016   S/P fall  . Recurrent urinary tract infection    Seeing Urology  . 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 Seizures ([place])   . 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[place]) 3/76/2831    Past Surgical History:  Procedure Laterality Date  . ANKLE FRACTURE SURGERY Bilateral  08/2015   "right one was in 2 places"  . CARDIAC CATHETERIZATION N/A 05/17/2015   Procedure: Left Heart Cath and Coronary Angiography;  Surgeon: Jettie Booze, MD;  Location: [facility];  Service: Cardiovascular;  Laterality: N/A;  . CARDIAC CATHETERIZATION N/A 05/17/2015   Procedure: Coronary Balloon Angioplasty;  Surgeon: Jettie Booze, MD;  Location: [facility];  Service: Cardiovascular;  Laterality: N/A;  . FEMUR IM NAIL Right 07/15/2012   Procedure: INTRAMEDULLARY (IM) NAIL HIP;  Surgeon: Johnny Bridge, MD;  Location: [facility];  Service: Orthopedics;  Laterality: Right;  . FEMUR IM NAIL Left 12/02/2015   Procedure: INTRAMEDULLARY (IM) NAIL FEMORAL;  Surgeon: [name], MD;  Location: [facility];  Service: Orthopedics;  Laterality: Left;  . FRACTURE SURGERY    . INCISION AND DRAINAGE HIP Left 01/03/2016   Procedure: IRRIGATION AND DEBRIDEMENT HIP;  Surgeon: [name], MD;  Location: [facility];  Service: Orthopedics;  Laterality: Left;  . SHOULDER SURGERY Bilateral    "don't remember which side"    Social History   Social History  . Marital status: Married    Spouse name: Jenny Reichmann  . Number of children: 1  . Years of education: College   Occupational History  . retired  Retired   Social History Main Topics  . Smoking status: Former Smoker    Types: Cigarettes    Quit date: 03/02/1978  .  Smokeless tobacco: Never Used  . Alcohol use No  . Drug use: No  . Sexual activity: No   Other Topics Concern  . Not on file   Social History Narrative   Patient lives at home spouse. Uses a walker consistently.   Moved from [place] 2004   1 son, problems w/ drugs, passed away 03-11-16-- OD        Current Outpatient Prescriptions on File Prior to Visit  Medication Sig Dispense Refill  . atorvastatin (LIPITOR) 80 MG tablet Take 1 tablet (80 mg total) by mouth daily. 90 tablet 1  . calcium-vitamin D (OSCAL WITH D) 500-200 MG-UNIT per tablet Take 1 tablet by mouth daily.    .  clopidogrel (PLAVIX) 75 MG tablet Take 1 tablet (75 mg total) by mouth daily. 90 tablet 1  . escitalopram (LEXAPRO) 10 MG tablet Take 2 tablets (20 mg total) by mouth daily. 180 tablet 1  . furosemide (LASIX) 40 MG tablet Take 1 tablet (40 mg total) by mouth 2 (two) times daily as needed for fluid or edema. 180 tablet 1  . insulin NPH-regular Human (NOVOLIN 70/30 RELION) (70-30) 100 UNIT/ML injection Used to inject 28 units subcutaneously with breakfast and then inject 12 units with evening meal. 10 mL 11  . metoprolol succinate (TOPROL-XL) 25 MG 24 hr tablet Take 1 tablet (25 mg total) by mouth daily. Take with or immediately following a meal. 90 tablet 1  . Multiple Vitamins-Minerals (MULTIVITAMIN ADULT PO) Take 1 tablet by mouth daily with breakfast.     . pantoprazole (PROTONIX) 40 MG tablet Take 1 tablet (40 mg total) by mouth daily. 90 tablet 3  . promethazine (PHENERGAN) 12.5 MG tablet Take 1 tablet (12.5 mg total) by mouth every 6 (six) hours as needed for nausea or vomiting. (Patient not taking: Reported on 10/26/2016) 30 tablet 1  . sulfamethoxazole-trimethoprim (BACTRIM DS,SEPTRA DS) 800-160 MG tablet Take 1 tablet by mouth 2 (two) times daily. (Patient not taking: Reported on 11/25/2016) 6 tablet 0  . warfarin (COUMADIN) 2.5 MG tablet Take 1 tablet (2.5 mg total) by mouth daily. Adjust dose as directed by coumadin clinic 75 tablet 2   No current facility-administered medications on file prior to visit.     Allergies  Allergen Reactions  . Procaine Hcl Anaphylaxis  . Amoxicillin Itching    Family History  Problem Relation Age of Onset  . Diabetes Father   . Heart attack Father 46  . Diabetes Sister   . Drug abuse Son   . Cancer Neg Hx        no hx of colon or breast cancer    BP (!) 98/50   Pulse 74   Wt 186 lb 3.2 oz (84.5 kg)   SpO2 97%   BMI 27.50 kg/m   Review of Systems She denies hypoglycemia.  No weight change.      Objective:   Physical Exam VITAL SIGNS:   See vs page GENERAL: no distress Pulses: foot pulses are intact bilaterally.   MSK: no deformity of the feet or ankles.  CV: no edema of the legs or ankles Skin:  no ulcer on the feet or ankles.  normal color and temp on the feet and ankles Neuro: sensation is intact to touch on the feet and ankles.   Ext: There is bilateral onychomycosis of the toenails.       Assessment & Plan:  Insulin-requiring type 2 DM, with CAD. therapy limited by variable cbg's.  Severe hypoglycemia:  she is not a candidate for aggressive glycemic control.   Patient Instructions  Please continue the same insulin.   On this type of insulin schedule, you should eat meals on a regular schedule.  If a meal is missed or significantly delayed, your blood sugar could go low.   The next step is to control the afternoon blood sugar by maintaining a good diet.   Please come back for a follow-up appointment in 6 weeks.   check your blood sugar twice a day.  vary the time of day when you check, between before the 3 meals, and at bedtime.  also check if you have symptoms of your blood sugar being too high or too low.  please keep a record of the readings and bring it to your next appointment here.  please call us sooner if your blood sugar goes below 70, or if you have a lot of readings over 200.   Here are some papers to write it on.

## 2016-11-27 NOTE — Patient Instructions (Addendum)
Please continue the same insulin.   On this type of insulin schedule, you should eat meals on a regular schedule.  If a meal is missed or significantly delayed, your blood sugar could go low.   The next step is to control the afternoon blood sugar by maintaining a good diet.   Please come back for a follow-up appointment in 6 weeks.   check your blood sugar twice a day.  vary the time of day when you check, between before the 3 meals, and at bedtime.  also check if you have symptoms of your blood sugar being too high or too low.  please keep a record of the readings and bring it to your next appointment here.  please call us sooner if your blood sugar goes below 70, or if you have a lot of readings over 200.   Here are some papers to write it on.

## 2016-11-27 NOTE — Assessment & Plan Note (Signed)
Right middle finger injury:   injured 4 weeks ago, still having pain, no deformities. Get a x-ray, further advice w/result . Addendum: X-ray showed a fracture, is already healing, I talk w/ pt, rec a splinter , she know the DIP functionality may not be back 100%, and she accepted that, declined ortho referral HTN: Last potassium is slightly low, recheck today. UTI: on 10/15/2016, she had her third UCX + for klebsiella, she was admitted 10/02/2016 with severe hyperglycemia and the culprit was felt to be a UTI however she did not have UTI symptoms then. She remains asx.Plan is not to treat with antibiotics at this point, this was informally discussed with ID before and they think that is reasonable. RTC November as a schedule

## 2016-12-24 ENCOUNTER — Ambulatory Visit (INDEPENDENT_AMBULATORY_CARE_PROVIDER_SITE_OTHER): Payer: Medicare Other | Admitting: *Deleted

## 2016-12-24 DIAGNOSIS — I4892 Unspecified atrial flutter: Secondary | ICD-10-CM

## 2016-12-24 DIAGNOSIS — I482 Chronic atrial fibrillation, unspecified: Secondary | ICD-10-CM

## 2016-12-24 DIAGNOSIS — Z7901 Long term (current) use of anticoagulants: Secondary | ICD-10-CM | POA: Diagnosis not present

## 2016-12-24 DIAGNOSIS — Z5181 Encounter for therapeutic drug level monitoring: Secondary | ICD-10-CM

## 2016-12-24 DIAGNOSIS — I4891 Unspecified atrial fibrillation: Secondary | ICD-10-CM

## 2016-12-24 LAB — POCT INR: INR: 3

## 2017-01-07 ENCOUNTER — Ambulatory Visit: Payer: Medicare Other | Admitting: Endocrinology

## 2017-01-08 ENCOUNTER — Encounter: Payer: Self-pay | Admitting: Endocrinology

## 2017-01-08 ENCOUNTER — Encounter: Payer: Self-pay | Admitting: Internal Medicine

## 2017-01-08 ENCOUNTER — Ambulatory Visit (INDEPENDENT_AMBULATORY_CARE_PROVIDER_SITE_OTHER): Payer: Medicare Other | Admitting: Endocrinology

## 2017-01-08 ENCOUNTER — Ambulatory Visit (INDEPENDENT_AMBULATORY_CARE_PROVIDER_SITE_OTHER): Payer: Medicare Other | Admitting: Internal Medicine

## 2017-01-08 VITALS — BP 126/78 | HR 68 | Temp 97.6°F | Resp 14 | Ht 69.0 in | Wt 194.2 lb

## 2017-01-08 VITALS — BP 128/62 | HR 62 | Wt 192.8 lb

## 2017-01-08 DIAGNOSIS — I2510 Atherosclerotic heart disease of native coronary artery without angina pectoris: Secondary | ICD-10-CM | POA: Diagnosis not present

## 2017-01-08 DIAGNOSIS — E1159 Type 2 diabetes mellitus with other circulatory complications: Secondary | ICD-10-CM | POA: Diagnosis not present

## 2017-01-08 DIAGNOSIS — S6991XA Unspecified injury of right wrist, hand and finger(s), initial encounter: Secondary | ICD-10-CM

## 2017-01-08 LAB — POCT GLYCOSYLATED HEMOGLOBIN (HGB A1C): HEMOGLOBIN A1C: 8.5

## 2017-01-08 MED ORDER — INSULIN NPH ISOPHANE & REGULAR (70-30) 100 UNIT/ML ~~LOC~~ SUSP: SUBCUTANEOUS | 11 refills | Status: DC

## 2017-01-08 NOTE — Patient Instructions (Addendum)
Please increase the breakfast insulin to 30 units, and continue the supper insulin at 12 units On this type of insulin schedule, you should eat meals on a regular schedule.  If a meal is missed or significantly delayed, your blood sugar could go low.   Please come back for a follow-up appointment in 3 months   check your blood sugar twice a day.  vary the time of day when you check, between before the 3 meals, and at bedtime.  also check if you have symptoms of your blood sugar being too high or too low.  please keep a record of the readings and bring it to your next appointment here.  please call us sooner if your blood sugar goes below 70, or if you have a lot of readings over 200.   Here are some papers to write it on.

## 2017-01-08 NOTE — Progress Notes (Signed)
Subjective:    Patient ID: [name] [name], female    DOB: 05-03-1940, 76 y.o.   MRN: [id number]  HPI Pt returns for f/u of diabetes mellitus: DM type: Insulin-requiring type 2 Dx'ed: 9326 Complications: polyneuropathy, CAD, and retinopathy.  Therapy: insulin since soon after dx.  GDM: never.  DKA: never.  Severe hypoglycemia: last episode was in mid-2018.  Pancreatitis: never.   Other: she is on BID premixed insulin, due to noncompliance with multiple daily injections.  She takes human insulin, due to cost.  Interval history: she brings a record of her cbg's which I have reviewed today.  It varies from 113-369.  It is in general higher as the day goes on, but not necessarily so.  The fasting cbg varies varies very little (177-195), but the PM varies widely Past Medical History:  Diagnosis Date  . Atrial fibrillation ([place])    SVT dx 2007, cath 2007 mild CAD, then had an cardioversion, ablation; still on coumadin , has occ palpitation, EKG 03-2010 NSR  . Blindness of left eye   . CKD (chronic kidney disease), stage III ([place]) 06/16/2016   [name] 06/17/2016  . DIABETES MELLITUS, TYPE II 03/27/2006   dr [name]  . Eye muscle weakness    Right eye weakness after cataract surgery  . GERD (gastroesophageal reflux disease) 10/05/2011  . 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 HYPERLIPIDEMIA 03/27/2006  . HYPERTENSION 03/27/2006  . Intertrochanteric fracture of right hip ([place]) 07/13/2012  . 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 Osteopenia 2004   Dexa 2004 showed Osteopenia, DEXA 03/2007 normal  . Osteopenia   . Other chronic cystitis with hematuria   . Pelvic fracture ([place]) 06/16/2016   S/P fall  . Recurrent urinary tract infection    Seeing Urology  . 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 Seizures ([place])   . 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[place]) 09/11/4578    Past Surgical History:  Procedure Laterality Date  . ANKLE FRACTURE SURGERY Bilateral  08/2015   "right one was in 2 places"  . FRACTURE SURGERY    . SHOULDER SURGERY Bilateral    "don't remember which side"    Social History   Socioeconomic History  . Marital status: Married    Spouse name: Jenny Reichmann  . Number of children: 1  . Years of education: College  . Highest education level: Not on file  Social Needs  . Financial resource strain: Not on file  . Food insecurity - worry: Not on file  . Food insecurity - inability: Not on file  . Transportation needs - medical: Not on file  . Transportation needs - non-medical: Not on file  Occupational History  . Occupation: retired     Fish farm manager: RETIRED  Tobacco Use  . Smoking status: Former Smoker    Types: Cigarettes    Last attempt to quit: 03/02/1978    Years since quitting: 38.8  . Smokeless tobacco: Never Used  Substance and Sexual Activity  . Alcohol use: No    Alcohol/week: 0.0 oz  . Drug use: No  . Sexual activity: No  Other Topics Concern  . Not on file  Social History Narrative   Patient lives at home spouse. Uses a walker consistently.   Moved from [place] 2004   1 son, problems w/ drugs, passed away 2016/03/08-- OD        Current Outpatient Medications on File Prior to Visit  Medication  Sig Dispense Refill  . atorvastatin (LIPITOR) 80 MG tablet Take 1 tablet (80 mg total) by mouth daily. 90 tablet 1  . calcium-vitamin D (OSCAL WITH D) 500-200 MG-UNIT per tablet Take 1 tablet by mouth daily.    . clopidogrel (PLAVIX) 75 MG tablet Take 1 tablet (75 mg total) by mouth daily. 90 tablet 1  . escitalopram (LEXAPRO) 10 MG tablet Take 2 tablets (20 mg total) by mouth daily. 180 tablet 1  . furosemide (LASIX) 40 MG tablet Take 1 tablet (40 mg total) by mouth 2 (two) times daily as needed for fluid or edema. 180 tablet 1  . metoprolol succinate (TOPROL-XL) 25 MG 24 hr tablet Take 1 tablet (25 mg total) by mouth daily. Take with or immediately following a meal. 90 tablet 1  . Multiple Vitamins-Minerals (MULTIVITAMIN ADULT  PO) Take 1 tablet by mouth daily with breakfast.     . nitrofurantoin (MACRODANTIN) 100 MG capsule Take 100 mg 4 (four) times daily by mouth.    . pantoprazole (PROTONIX) 40 MG tablet Take 1 tablet (40 mg total) by mouth daily. 90 tablet 3  . promethazine (PHENERGAN) 12.5 MG tablet Take 1 tablet (12.5 mg total) by mouth every 6 (six) hours as needed for nausea or vomiting. 30 tablet 1  . warfarin (COUMADIN) 2.5 MG tablet Take 1 tablet (2.5 mg total) by mouth daily. Adjust dose as directed by coumadin clinic 75 tablet 2   No current facility-administered medications on file prior to visit.     Allergies  Allergen Reactions  . Procaine Hcl Anaphylaxis  . Amoxicillin Itching    Family History  Problem Relation Age of Onset  . Diabetes Father   . Heart attack Father 49  . Diabetes Sister   . Drug abuse Son   . Cancer Neg Hx        no hx of colon or breast cancer    BP 128/62 (BP Location: Left Arm, Patient Position: Sitting, Cuff Size: Normal)   Pulse 62   Wt 192 lb 12.8 oz (87.5 kg)   SpO2 99%   BMI 28.47 kg/m    Review of Systems She denies hypoglycemia,     Objective:   Physical Exam VITAL SIGNS:  See vs page GENERAL: no distress Pulses: foot pulses are intact bilaterally.   MSK: no deformity of the feet or ankles.  CV: no edema of the legs or ankles Skin:  no ulcer on the feet or ankles.  normal color and temp on the feet and ankles Neuro: sensation is intact to touch on the feet and ankles.   Ext: There is bilateral onychomycosis of the toenails.    Lab Results  Component Value Date   HGBA1C 8.5 01/08/2017      Assessment & Plan:  Insulin-requiring type 2 DM, with DR: Needs increased rx, if it can be done with a regimen that avoids or minimizes hypoglycemia.   Patient Instructions  Please increase the breakfast insulin to 30 units, and continue the supper insulin at 12 units On this type of insulin schedule, you should eat meals on a regular schedule.  If a  meal is missed or significantly delayed, your blood sugar could go low.   Please come back for a follow-up appointment in 3 months   check your blood sugar twice a day.  vary the time of day when you check, between before the 3 meals, and at bedtime.  also check if you have symptoms of your blood sugar  being too high or too low.  please keep a record of the readings and bring it to your next appointment here.  please call us sooner if your blood sugar goes below 70, or if you have a lot of readings over 200.   Here are some papers to write it on.

## 2017-01-08 NOTE — Progress Notes (Signed)
Pre visit review using our clinic review tool, if applicable. No additional management support is needed unless otherwise documented below in the visit note. 

## 2017-01-08 NOTE — Patient Instructions (Signed)
  GO TO THE FRONT DESK Schedule your next appointment for a  Yearly check up in 5 months

## 2017-01-08 NOTE — Progress Notes (Signed)
Subjective:    Patient ID: [name] [name], female    DOB: [birth year], 76 y.o.   MRN: [id number]  DOS:  01/08/2017 Type of visit - description : f/u Interval history: Had a finger injury, is healing well, no pain. HTN: Ambulatory BPs within normal Eyesight is getting poorer on the right.   Review of Systems Denies nausea, vomiting, diarrhea blood in the stools.  No gross hematuria.  Past Medical History:  Diagnosis Date  . Atrial fibrillation ([place])    SVT dx 2007, cath 2007 mild CAD, then had an cardioversion, ablation; still on coumadin , has occ palpitation, EKG 03-2010 NSR  . Blindness of left eye   . CKD (chronic kidney disease), stage III ([place]) 06/16/2016   [name] 06/17/2016  . DIABETES MELLITUS, TYPE II 03/27/2006   dr [name]  . Eye muscle weakness    Right eye weakness after cataract surgery  . GERD (gastroesophageal reflux disease) 10/05/2011  . 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 HYPERLIPIDEMIA 03/27/2006  . HYPERTENSION 03/27/2006  . Intertrochanteric fracture of right hip ([place]) 07/13/2012  . 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 Osteopenia 2004   Dexa 2004 showed Osteopenia, DEXA 03/2007 normal  . Osteopenia   . Other chronic cystitis with hematuria   . Pelvic fracture ([place]) 06/16/2016   S/P fall  . Recurrent urinary tract infection    Seeing Urology  . 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 Seizures ([place])   . 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[address] [address]) 2/87/8676    Past Surgical History:  Procedure Laterality Date  . ANKLE FRACTURE SURGERY Bilateral 08/2015   "right one was in 2 places"  . FRACTURE SURGERY    . SHOULDER SURGERY Bilateral    "don't remember which side"    Social History   Socioeconomic History  . Marital status: Married    Spouse name: Jenny Reichmann  . Number of children: 1  . Years of education: College  . Highest education level: Not on file  Social Needs  . Financial resource strain: Not on file  . Food  insecurity - worry: Not on file  . Food insecurity - inability: Not on file  . Transportation needs - medical: Not on file  . Transportation needs - non-medical: Not on file  Occupational History  . Occupation: retired     Fish farm manager: RETIRED  Tobacco Use  . Smoking status: Former Smoker    Types: Cigarettes    Last attempt to quit: 03/02/1978    Years since quitting: 38.8  . Smokeless tobacco: Never Used  Substance and Sexual Activity  . Alcohol use: No    Alcohol/week: 0.0 oz  . Drug use: No  . Sexual activity: No  Other Topics Concern  . Not on file  Social History Narrative   Patient lives at home spouse. Uses a walker consistently.   Moved from [place] 2004   1 son, problems w/ drugs, passed away 19-Mar-2016-- OD          Allergies as of 01/08/2017      Reactions   Procaine Hcl Anaphylaxis   Amoxicillin Itching      Medication List        Accurate as of 01/08/17 11:59 PM. Always use your most recent med list.          atorvastatin 80 MG tablet Commonly known as:  LIPITOR Take 1 tablet (80 mg total) by mouth daily.  calcium-vitamin D 500-200 MG-UNIT tablet Commonly known as:  OSCAL WITH D Take 1 tablet by mouth daily.   clopidogrel 75 MG tablet Commonly known as:  PLAVIX Take 1 tablet (75 mg total) by mouth daily.   escitalopram 10 MG tablet Commonly known as:  LEXAPRO Take 2 tablets (20 mg total) by mouth daily.   furosemide 40 MG tablet Commonly known as:  LASIX Take 1 tablet (40 mg total) by mouth 2 (two) times daily as needed for fluid or edema.   insulin NPH-regular Human (70-30) 100 UNIT/ML injection Commonly known as:  NOVOLIN 70/30 RELION 30 units with breakfast and 12 units with evening meal.   metoprolol succinate 25 MG 24 hr tablet Commonly known as:  TOPROL-XL Take 1 tablet (25 mg total) by mouth daily. Take with or immediately following a meal.   MULTIVITAMIN ADULT PO Take 1 tablet by mouth daily with breakfast.   nitrofurantoin 100 MG  capsule Commonly known as:  MACRODANTIN Take 100 mg 4 (four) times daily by mouth.   pantoprazole 40 MG tablet Commonly known as:  PROTONIX Take 1 tablet (40 mg total) by mouth daily.   promethazine 12.5 MG tablet Commonly known as:  PHENERGAN Take 1 tablet (12.5 mg total) by mouth every 6 (six) hours as needed for nausea or vomiting.   warfarin 2.5 MG tablet Commonly known as:  COUMADIN Take as directed by the anticoagulation clinic. If you are unsure how to take this medication, talk to your nurse or doctor. Original instructions:  Take 1 tablet (2.5 mg total) by mouth daily. Adjust dose as directed by coumadin clinic          Objective:   Physical Exam BP 126/78 (BP Location: Left Arm, Patient Position: Sitting, Cuff Size: Normal)   Pulse 68   Temp 97.6 F (36.4 C) (Oral)   Resp 14   Ht 5\' 9"  (1.753 m)   Wt 194 lb 4 oz (88.1 kg)   SpO2 96%   BMI 28.69 kg/m  General:   Well developed, well nourished . NAD.  HEENT:  Normocephalic . Face symmetric, atraumatic Lungs:  CTA B Normal respiratory effort, no intercostal retractions, no accessory muscle use. Heart: RRR,  no murmur.  No pretibial edema bilaterally  Skin: Not pale. Not jaundice MSK: Mid finger, right, DIP: Decreased mobility without redness or swelling. Neurologic:  alert & oriented X3.  Speech normal, gait assisted by a walker. Psych--  Cognition and judgment appear intact.  Cooperative with normal attention span and concentration.  Behavior appropriate. No anxious or depressed appearing.      Assessment & Plan:   Assessment   DM  + retinopathy, Dr [name] HTN Hyperlipidemia anxiety ativan GERD Elevated creatinine(started after admission 05-2015, had IV contrast) Anemia, normal iron 05-2015, never had a colonoscopy CV ---NSTEMI 05-2015, cath, angiplasty ---Echo, EF 40 % (05-2015) ---Atrial fibrillation, SVT -- dx 2007, cath 2007 mild CAD. S/p cardioversion, then ablation, on coumadin, last visit  cards 2014  Osteopenia GU Urinary retention /UTI 05-2015, had a foley temporarily, saw urology, PVR 249 cc (high), rx timed voiding Neuro: -Herpes encephalitis 2014 -Seizures (after encephalitis), sz episode 08-2015 (?) - HAs Blindness, left eye Right eye weakness after cataract surgery Osteopenia per DEXA 2004, DEXA 2009 normal, DEXA 2015 @ [facility] -1.8 H/o lung bx 2008 benign  05-2015- admitted [facility] sepsis  08-2015 admitted  B tibular Fx, no surgery, SNF 12-2015:   admitted, hip FX , surgery 01-2016: admitted, hip surgery infex  05-2016: Admitted, fall, pelvic fracture, d/c to home 10-02-16: admitted  UTI, nonketotic hyperglycemic syndrome   PLAN: Finger injury: Mobility slightly decreased, see physical exam.  Has no pain.  No further intervention needed DM: Per endocrinology will see them today Poor vision: Blind on the left eye, right vision is getting worse, declining ophthalmology referral for further eval.  States she does see optometrist. RTC in 5 months, yearly checkup

## 2017-01-10 NOTE — Assessment & Plan Note (Signed)
Finger injury: Mobility slightly decreased, see physical exam.  Has no pain.  No further intervention needed DM: Per endocrinology will see them today Poor vision: Blind on the left eye, right vision is getting worse, declining ophthalmology referral for further eval.  States [name] [name] does see optometrist. RTC in 5 months, yearly checkup

## 2017-01-19 ENCOUNTER — Other Ambulatory Visit: Payer: Self-pay | Admitting: Family Medicine

## 2017-01-20 ENCOUNTER — Other Ambulatory Visit: Payer: Self-pay | Admitting: Emergency Medicine

## 2017-01-20 ENCOUNTER — Ambulatory Visit (INDEPENDENT_AMBULATORY_CARE_PROVIDER_SITE_OTHER): Payer: Medicare Other | Admitting: *Deleted

## 2017-01-20 DIAGNOSIS — I4892 Unspecified atrial flutter: Secondary | ICD-10-CM | POA: Diagnosis not present

## 2017-01-20 DIAGNOSIS — Z7901 Long term (current) use of anticoagulants: Secondary | ICD-10-CM

## 2017-01-20 DIAGNOSIS — Z5181 Encounter for therapeutic drug level monitoring: Secondary | ICD-10-CM | POA: Diagnosis not present

## 2017-01-20 DIAGNOSIS — I4891 Unspecified atrial fibrillation: Secondary | ICD-10-CM | POA: Diagnosis not present

## 2017-01-20 LAB — POCT INR: INR: 1.4

## 2017-01-20 MED ORDER — CLOPIDOGREL BISULFATE 75 MG PO TABS: 75.0000 mg | ORAL_TABLET | Freq: Every day | ORAL | 1 refills | Status: DC

## 2017-01-20 NOTE — Patient Instructions (Signed)
Today Nov 21st  take another 1/2 tablet then tomorrow Nov 22nd take 1 and 1/2 tablets then continue taking 1 tablet daily except 1 and 1/2 tablets on Saturdays.   Recheck INR in 10 days. Call us with any medication changes [phone] to Coumadin clinic.

## 2017-01-26 ENCOUNTER — Other Ambulatory Visit: Payer: Self-pay | Admitting: Emergency Medicine

## 2017-01-26 MED ORDER — ATORVASTATIN CALCIUM 80 MG PO TABS: 80.0000 mg | ORAL_TABLET | Freq: Every day | ORAL | 1 refills | Status: DC

## 2017-01-29 ENCOUNTER — Ambulatory Visit (INDEPENDENT_AMBULATORY_CARE_PROVIDER_SITE_OTHER): Payer: Medicare Other | Admitting: Pharmacist

## 2017-01-29 DIAGNOSIS — I4891 Unspecified atrial fibrillation: Secondary | ICD-10-CM | POA: Diagnosis not present

## 2017-01-29 DIAGNOSIS — I4892 Unspecified atrial flutter: Secondary | ICD-10-CM

## 2017-01-29 DIAGNOSIS — Z7901 Long term (current) use of anticoagulants: Secondary | ICD-10-CM | POA: Diagnosis not present

## 2017-01-29 DIAGNOSIS — Z5181 Encounter for therapeutic drug level monitoring: Secondary | ICD-10-CM | POA: Diagnosis not present

## 2017-01-29 LAB — POCT INR: INR: 2.1

## 2017-01-29 NOTE — Patient Instructions (Signed)
Continue taking 1 tablet daily except 1 and 1/2 tablets on Saturdays.   Recheck INR in 2 weeks. Call us with any medication changes [phone] to Coumadin clinic.

## 2017-02-12 ENCOUNTER — Ambulatory Visit (INDEPENDENT_AMBULATORY_CARE_PROVIDER_SITE_OTHER): Payer: Medicare Other | Admitting: *Deleted

## 2017-02-12 DIAGNOSIS — Z7901 Long term (current) use of anticoagulants: Secondary | ICD-10-CM | POA: Diagnosis not present

## 2017-02-12 DIAGNOSIS — I4892 Unspecified atrial flutter: Secondary | ICD-10-CM

## 2017-02-12 DIAGNOSIS — Z5181 Encounter for therapeutic drug level monitoring: Secondary | ICD-10-CM

## 2017-02-12 DIAGNOSIS — I4891 Unspecified atrial fibrillation: Secondary | ICD-10-CM

## 2017-02-12 DIAGNOSIS — I482 Chronic atrial fibrillation, unspecified: Secondary | ICD-10-CM

## 2017-02-12 LAB — POCT INR: INR: 1.5

## 2017-02-12 NOTE — Patient Instructions (Addendum)
Description   Today take 1.5 tablets and tomorrow take 2 tablets then continue taking 1 tablet daily except 1 and 1/2 tablets on Saturdays.   Recheck INR in 12 days per request. Call us with any medication changes [phone] to Coumadin clinic.

## 2017-02-15 ENCOUNTER — Other Ambulatory Visit: Payer: Self-pay | Admitting: Family Medicine

## 2017-02-24 ENCOUNTER — Ambulatory Visit (INDEPENDENT_AMBULATORY_CARE_PROVIDER_SITE_OTHER): Payer: Medicare Other | Admitting: *Deleted

## 2017-02-24 DIAGNOSIS — Z7901 Long term (current) use of anticoagulants: Secondary | ICD-10-CM

## 2017-02-24 DIAGNOSIS — I4891 Unspecified atrial fibrillation: Secondary | ICD-10-CM

## 2017-02-24 DIAGNOSIS — I4892 Unspecified atrial flutter: Secondary | ICD-10-CM

## 2017-02-24 DIAGNOSIS — Z5181 Encounter for therapeutic drug level monitoring: Secondary | ICD-10-CM | POA: Diagnosis not present

## 2017-02-24 LAB — POCT INR: INR: 3.3

## 2017-02-24 NOTE — Patient Instructions (Signed)
Description   Skip today's dose, then continue taking 1 tablet daily except 1 and 1/2 tablets on Saturdays.   Recheck INR in 2 weeks.  Call us with any medication changes [phone] to Coumadin clinic.

## 2017-03-10 ENCOUNTER — Ambulatory Visit (INDEPENDENT_AMBULATORY_CARE_PROVIDER_SITE_OTHER): Payer: Medicare Other | Admitting: *Deleted

## 2017-03-10 DIAGNOSIS — I4892 Unspecified atrial flutter: Secondary | ICD-10-CM | POA: Diagnosis not present

## 2017-03-10 DIAGNOSIS — Z5181 Encounter for therapeutic drug level monitoring: Secondary | ICD-10-CM | POA: Diagnosis not present

## 2017-03-10 DIAGNOSIS — I4891 Unspecified atrial fibrillation: Secondary | ICD-10-CM

## 2017-03-10 DIAGNOSIS — Z7901 Long term (current) use of anticoagulants: Secondary | ICD-10-CM | POA: Diagnosis not present

## 2017-03-10 LAB — POCT INR: INR: 2.4

## 2017-03-10 NOTE — Patient Instructions (Signed)
Description   Continue taking 1 tablet daily except 1 and 1/2 tablets on Saturdays.   Recheck INR in 3 weeks.  Call us with any medication changes [phone] to Coumadin clinic.

## 2017-03-19 ENCOUNTER — Encounter (INDEPENDENT_AMBULATORY_CARE_PROVIDER_SITE_OTHER): Payer: Self-pay | Admitting: Orthopaedic Surgery

## 2017-03-19 ENCOUNTER — Ambulatory Visit (INDEPENDENT_AMBULATORY_CARE_PROVIDER_SITE_OTHER): Payer: Medicare Other | Admitting: Orthopaedic Surgery

## 2017-03-19 VITALS — BP 124/64 | HR 60 | Ht 69.0 in | Wt 191.0 lb

## 2017-03-19 DIAGNOSIS — S72141G Displaced intertrochanteric fracture of right femur, subsequent encounter for closed fracture with delayed healing: Secondary | ICD-10-CM

## 2017-03-19 NOTE — Progress Notes (Signed)
Office Visit Note   Patient: [name] [name]           Date of Birth: 1940/11/27           MRN: [id number] Visit Date: 03/19/2017              Requested by: Colon Branch, [address] [place], [place] [address] PCP: Colon Branch, MD   Assessment & Plan: Visit Diagnoses:  1. Closed displaced intertrochanteric fracture of right femur with delayed healing, subsequent encounter     Plan: 47-month follow-up check post pelvic fracture with IT hip  fracture with surgery 06/15/2016.  She is doing well ambulatory with no complaints.  I can check her back again on an as-needed basis.  She is very happy with the surgical result.  Follow-Up Instructions: Return if symptoms worsen or fail to improve.   Orders:  No orders of the defined types were placed in this encounter.  No orders of the defined types were placed in this encounter.     Procedures: No procedures performed   Clinical Data: No additional findings.   Subjective: Chief Complaint  Patient presents with  . Pelvis - Fracture, Follow-up    HPI 77 year old female returns for 48-month follow-up post inner troches fracture fixation April 2018 and closed pelvic fracture treatment.  Nonoperative treatment for the right pubic rami fracture.  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  She is very happy with the surgical result from her hip fixation.  She has no medication problems.  Review of Systems 14 point review of systems updated positive for obesity previous inner troches fracture right and left hips, pelvic fracture healed, hypertension, atrial fib, hyperlipidemia.  Otherwise negative as it pertains HPI.   Objective: Vital Signs: BP 124/64   Pulse 60   Ht 5\' 9"  (1.753 m)   Wt 191 lb (86.6 kg)   BMI 28.21 kg/m   Physical Exam  Constitutional: She is oriented to person, place, and time. She appears well-developed.  HENT:  Head:  Normocephalic.  Right Ear: External ear normal.  Left Ear: External ear normal.  Eyes: Pupils are equal, round, and reactive to light.  Neck: No tracheal deviation present. No thyromegaly present.  Cardiovascular: Normal rate.  Pulmonary/Chest: Effort normal.  Abdominal: Soft.  Neurological: She is alert and oriented to person, place, and time.  Skin: Skin is warm and dry.  Psychiatric: She has a normal mood and affect. Her behavior is normal.    Ortho Exam patient's knees reach full extension she ambulates with a rolling walker without a limp.  Denies hip pain.  No pitting edema.  She does have increased BMI which she is working on.  Specialty Comments:  No specialty comments available.  Imaging: No results found.   PMFS History: Patient Active Problem List   Diagnosis Date Noted  . Hyperglycemia 10/02/2016  . Foot ulcer ([facility]) 07/24/2016  . Closed fracture of ramus of right pubis ([facility])   . Gram-negative infection   . CKD (chronic kidney disease) stage 3, GFR 30-59 ml/min ([facility])   . Postoperative anemia due to acute blood loss 01/04/2016  . Left hip postoperative wound infection 01/02/2016  . Closed intertrochanteric fracture of hip, left, sequela 12/25/2015  . CAD (coronary artery disease) 05/20/2015  . Acute systolic CHF (congestive heart failure) ([facility]) 05/20/2015  . Type 2 diabetes mellitus with vascular disease (  [facility]) 05/17/2015  . NSTEMI (non-ST elevated myocardial infarction) ([facility])   . Acute kidney injury superimposed on chronic kidney disease ([facility]) 05/11/2015  . PCP NOTES >>>>>>>>>>>>>>>> 12/28/2014  . Memory loss 05/17/2014  . Anxiety and depression, PCP notes  11/24/2013  . Severe obesity (BMI >= 40) ([facility]) 07/14/2013  . Encounter for therapeutic drug monitoring 06/02/2013  . History of encephalitis 11/18/2012  . Intertrochanteric fracture of right hip ([place]) 07/13/2012  . GERD (gastroesophageal reflux disease) 10/05/2011  . Seizure disorder ([facility]) 05/24/2011  .  Annual physical exam >>>>>>>>>>>>>>>>>>>>>>>>>>>>>>>.. 02/10/2011  . Atrial fibrillation ([facility])   . 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 Hyperlipidemia 03/27/2006  . Hereditary and idiopathic peripheral neuropathy 03/27/2006  . 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 Essential hypertension 03/27/2006  . Osteoporosis 03/27/2006   Past Medical History:  Diagnosis Date  . Atrial fibrillation ([place])    SVT dx 2007, cath 2007 mild CAD, then had an cardioversion, ablation; still on coumadin , has occ palpitation, EKG 03-2010 NSR  . Blindness of left eye   . CKD (chronic kidney disease), stage III ([facility]) 06/16/2016   [name] 06/17/2016  . DIABETES MELLITUS, TYPE II 03/27/2006   dr [name]  . Eye muscle weakness    Right eye weakness after cataract surgery  . GERD (gastroesophageal reflux disease) 10/05/2011  . 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 HYPERLIPIDEMIA 03/27/2006  . HYPERTENSION 03/27/2006  . Intertrochanteric fracture of right hip ([facility]) 07/13/2012  . 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 Osteopenia 2004   Dexa 2004 showed Osteopenia, DEXA 03/2007 normal  . Osteopenia   . Other chronic cystitis with hematuria   . Pelvic fracture ([facility]) 06/16/2016   S/P fall  . Recurrent urinary tract infection    Seeing Urology  . 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 Seizures ([facility])   . 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[facility]) 6/38/7564    Family History  Problem Relation Age of Onset  . Diabetes Father   . Heart attack Father 77  . Diabetes Sister   . Drug abuse Son   . Cancer Neg Hx        no hx of colon or breast cancer    Past Surgical History:  Procedure Laterality Date  . ANKLE FRACTURE SURGERY Bilateral 08/2015   "right one was in 2 places"  . CARDIAC CATHETERIZATION N/A 05/17/2015   Procedure: Left Heart Cath and Coronary Angiography;  Surgeon: Jettie Booze, MD;  Location: [facility];  Service: Cardiovascular;  Laterality: N/A;  . CARDIAC  CATHETERIZATION N/A 05/17/2015   Procedure: Coronary Balloon Angioplasty;  Surgeon: Jettie Booze, MD;  Location: [facility];  Service: Cardiovascular;  Laterality: N/A;  . FEMUR IM NAIL Right 07/15/2012   Procedure: INTRAMEDULLARY (IM) NAIL HIP;  Surgeon: Johnny Bridge, MD;  Location: [facility];  Service: Orthopedics;  Laterality: Right;  . FEMUR IM NAIL Left 12/02/2015   Procedure: INTRAMEDULLARY (IM) NAIL FEMORAL;  Surgeon: [name], MD;  Location: [facility];  Service: Orthopedics;  Laterality: Left;  . FRACTURE SURGERY    . INCISION AND DRAINAGE HIP Left 01/03/2016   Procedure: IRRIGATION AND DEBRIDEMENT HIP;  Surgeon: [name], MD;  Location: [facility];  Service: Orthopedics;  Laterality: Left;  . SHOULDER SURGERY Bilateral    "don't remember which side"   Social History   Occupational History  . Occupation: retired     Fish farm manager:  RETIRED  Tobacco Use  . Smoking status: Former Smoker    Types: Cigarettes    Last attempt to quit: 03/02/1978    Years since quitting: 39.0  . Smokeless tobacco: Never Used  Substance and Sexual Activity  . Alcohol use: No    Alcohol/week: 0.0 oz  . Drug use: No  . Sexual activity: No

## 2017-03-22 ENCOUNTER — Other Ambulatory Visit: Payer: Self-pay | Admitting: Family Medicine

## 2017-03-31 ENCOUNTER — Ambulatory Visit (INDEPENDENT_AMBULATORY_CARE_PROVIDER_SITE_OTHER): Payer: Medicare Other | Admitting: *Deleted

## 2017-03-31 DIAGNOSIS — Z7901 Long term (current) use of anticoagulants: Secondary | ICD-10-CM | POA: Diagnosis not present

## 2017-03-31 DIAGNOSIS — I4891 Unspecified atrial fibrillation: Secondary | ICD-10-CM

## 2017-03-31 DIAGNOSIS — I4892 Unspecified atrial flutter: Secondary | ICD-10-CM | POA: Diagnosis not present

## 2017-03-31 DIAGNOSIS — Z5181 Encounter for therapeutic drug level monitoring: Secondary | ICD-10-CM | POA: Diagnosis not present

## 2017-03-31 LAB — POCT INR: INR: 2.6

## 2017-03-31 NOTE — Patient Instructions (Signed)
Description   Continue taking 1 tablet daily except 1 and 1/2 tablets on Saturdays.   Recheck INR in 4 weeks.  Call us with any medication changes [phone] to Coumadin clinic.

## 2017-04-09 ENCOUNTER — Ambulatory Visit (INDEPENDENT_AMBULATORY_CARE_PROVIDER_SITE_OTHER): Payer: Medicare Other | Admitting: Endocrinology

## 2017-04-09 VITALS — BP 134/64 | HR 64 | Wt 195.0 lb

## 2017-04-09 DIAGNOSIS — E1159 Type 2 diabetes mellitus with other circulatory complications: Secondary | ICD-10-CM | POA: Diagnosis not present

## 2017-04-09 LAB — POCT GLYCOSYLATED HEMOGLOBIN (HGB A1C): Hemoglobin A1C: 8.1

## 2017-04-09 MED ORDER — INSULIN NPH ISOPHANE & REGULAR (70-30) 100 UNIT/ML ~~LOC~~ SUSP: SUBCUTANEOUS | 11 refills | Status: DC

## 2017-04-09 NOTE — Progress Notes (Signed)
Subjective:    Patient ID: [name] [name], female    DOB: [birth year], 77 y.o.   MRN: [id number]  HPI Pt returns for f/u of diabetes mellitus: DM type: Insulin-requiring type 2 Dx'ed: 4765 Complications: polyneuropathy, CAD, and retinopathy.  Therapy: insulin since soon after dx.  GDM: never.  DKA: never.  Severe hypoglycemia: last episode was in mid-2018.  Pancreatitis: never.   Other: she is on BID premixed insulin, due to noncompliance with multiple daily injections.  She takes human insulin, due to cost.  Interval history: no cbg record, but states cbg's vary from 82-394.  It is in general higher as the day goes on.  Past Medical History:  Diagnosis Date  . Atrial fibrillation ([place] [place])    SVT dx 2007, cath 2007 mild CAD, then had an cardioversion, ablation; still on coumadin , has occ palpitation, EKG 03-2010 NSR  . Blindness of left eye   . CKD (chronic kidney disease), stage III ([facility]) 06/16/2016   [name] 06/17/2016  . DIABETES MELLITUS, TYPE II 03/27/2006   dr [name]  . Eye muscle weakness    Right eye weakness after cataract surgery  . GERD (gastroesophageal reflux disease) 10/05/2011  . 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 HYPERLIPIDEMIA 03/27/2006  . HYPERTENSION 03/27/2006  . Intertrochanteric fracture of right hip ([facility]) 07/13/2012  . 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 Osteopenia 2004   Dexa 2004 showed Osteopenia, DEXA 03/2007 normal  . Osteopenia   . Other chronic cystitis with hematuria   . Pelvic fracture ([facility]) 06/16/2016   S/P fall  . Recurrent urinary tract infection    Seeing Urology  . 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 Seizures ([facility])   . 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[facility]) 4/65/0354    Past Surgical History:  Procedure Laterality Date  . ANKLE FRACTURE SURGERY Bilateral 08/2015   "right one was in 2 places"  . CARDIAC CATHETERIZATION N/A 05/17/2015   Procedure: Left Heart Cath and Coronary Angiography;   Surgeon: Jettie Booze, MD;  Location: [facility];  Service: Cardiovascular;  Laterality: N/A;  . CARDIAC CATHETERIZATION N/A 05/17/2015   Procedure: Coronary Balloon Angioplasty;  Surgeon: Jettie Booze, MD;  Location: [facility];  Service: Cardiovascular;  Laterality: N/A;  . FEMUR IM NAIL Right 07/15/2012   Procedure: INTRAMEDULLARY (IM) NAIL HIP;  Surgeon: Johnny Bridge, MD;  Location: [facility];  Service: Orthopedics;  Laterality: Right;  . FEMUR IM NAIL Left 12/02/2015   Procedure: INTRAMEDULLARY (IM) NAIL FEMORAL;  Surgeon: [name], MD;  Location: [facility];  Service: Orthopedics;  Laterality: Left;  . FRACTURE SURGERY    . INCISION AND DRAINAGE HIP Left 01/03/2016   Procedure: IRRIGATION AND DEBRIDEMENT HIP;  Surgeon: [name], MD;  Location: [facility];  Service: Orthopedics;  Laterality: Left;  . SHOULDER SURGERY Bilateral    "don't remember which side"    Social History   Socioeconomic History  . Marital status: Married    Spouse name: Jenny Reichmann  . Number of children: 1  . Years of education: College  . Highest education level: Not on file  Social Needs  . Financial resource strain: Not on file  . Food insecurity - worry: Not on file  . Food insecurity - inability: Not on file  . Transportation needs - medical: Not on file  . Transportation needs - non-medical: Not on file  Occupational History  .  Occupation: retired     Fish farm manager: RETIRED  Tobacco Use  . Smoking status: Former Smoker    Types: Cigarettes    Last attempt to quit: 03/02/1978    Years since quitting: 39.1  . Smokeless tobacco: Never Used  Substance and Sexual Activity  . Alcohol use: No    Alcohol/week: 0.0 oz  . Drug use: No  . Sexual activity: No  Other Topics Concern  . Not on file  Social History Narrative   Patient lives at home spouse. Uses a walker consistently.   Moved from [place] 2004   1 son, problems w/ drugs, passed away Mar 15, 2016-- OD        Current Outpatient  Medications on File Prior to Visit  Medication Sig Dispense Refill  . atorvastatin (LIPITOR) 80 MG tablet Take 1 tablet (80 mg total) by mouth daily. 90 tablet 1  . calcium-vitamin D (OSCAL WITH D) 500-200 MG-UNIT per tablet Take 1 tablet by mouth daily.    . clopidogrel (PLAVIX) 75 MG tablet Take 1 tablet (75 mg total) by mouth daily. 90 tablet 1  . escitalopram (LEXAPRO) 10 MG tablet TAKE 2 TABLETS BY MOUTH ONCE DAILY 180 tablet 1  . furosemide (LASIX) 40 MG tablet Take 1 tablet (40 mg total) by mouth 2 (two) times daily as needed for fluid or edema. 180 tablet 1  . metoprolol succinate (TOPROL-XL) 25 MG 24 hr tablet Take 1 tablet (25 mg total) by mouth daily. Take with or immediately following meal 90 tablet 1  . Multiple Vitamins-Minerals (MULTIVITAMIN ADULT PO) Take 1 tablet by mouth daily with breakfast.     . nitrofurantoin (MACRODANTIN) 100 MG capsule Take 100 mg 4 (four) times daily by mouth.    . pantoprazole (PROTONIX) 40 MG tablet Take 1 tablet (40 mg total) by mouth daily. 90 tablet 3  . promethazine (PHENERGAN) 12.5 MG tablet Take 1 tablet (12.5 mg total) by mouth every 6 (six) hours as needed for nausea or vomiting. 30 tablet 1  . warfarin (COUMADIN) 2.5 MG tablet Take 1 tablet (2.5 mg total) by mouth daily. Adjust dose as directed by coumadin clinic 75 tablet 2   No current facility-administered medications on file prior to visit.     Allergies  Allergen Reactions  . Procaine Hcl Anaphylaxis  . Amoxicillin Itching    Family History  Problem Relation Age of Onset  . Diabetes Father   . Heart attack Father 54  . Diabetes Sister   . Drug abuse Son   . Cancer Neg Hx        no hx of colon or breast cancer    BP 134/64 (BP Location: Right Arm, Patient Position: Sitting, Cuff Size: Normal)   Pulse 64   Wt 195 lb (88.5 kg)   SpO2 95%   BMI 28.80 kg/m    Review of Systems She denies hypoglycemia.     Objective:   Physical Exam  VITAL SIGNS:  See vs page GENERAL:  no distress Pulses: foot pulses are intact bilaterally.   MSK: no deformity of the feet or ankles.  CV: no edema of the legs or ankles Skin:  no ulcer on the feet or ankles.  normal color and temp on the feet and ankles Neuro: sensation is intact to touch on the feet and ankles.   Ext: There is bilateral onychomycosis of the toenails   A1c=8.1%    Assessment & Plan:  Insulin-requiring type 2 DM: Based on the pattern of her cbg's,  she needs some adjustment in her therapy  Patient Instructions  Please change the insulin to 35 units with breakfast, 7 units with supper.  On this type of insulin schedule, you should eat meals on a regular schedule.  If a meal is missed or significantly delayed, your blood sugar could go low.   Please come back for a follow-up appointment in 3 months.   check your blood sugar twice a day.  vary the time of day when you check, between before the 3 meals, and at bedtime.  also check if you have symptoms of your blood sugar being too high or too low.  please keep a record of the readings and bring it to your next appointment here.  please call us sooner if your blood sugar goes below 70, or if you have a lot of readings over 200.   Here are some papers to write it on.

## 2017-04-09 NOTE — Patient Instructions (Signed)
Please change the insulin to 35 units with breakfast, 7 units with supper.  On this type of insulin schedule, you should eat meals on a regular schedule.  If a meal is missed or significantly delayed, your blood sugar could go low.   Please come back for a follow-up appointment in 3 months.   check your blood sugar twice a day.  vary the time of day when you check, between before the 3 meals, and at bedtime.  also check if you have symptoms of your blood sugar being too high or too low.  please keep a record of the readings and bring it to your next appointment here.  please call us sooner if your blood sugar goes below 70, or if you have a lot of readings over 200.   Here are some papers to write it on.

## 2017-04-28 ENCOUNTER — Ambulatory Visit (INDEPENDENT_AMBULATORY_CARE_PROVIDER_SITE_OTHER): Payer: Medicare Other | Admitting: *Deleted

## 2017-04-28 DIAGNOSIS — Z7901 Long term (current) use of anticoagulants: Secondary | ICD-10-CM

## 2017-04-28 DIAGNOSIS — Z5181 Encounter for therapeutic drug level monitoring: Secondary | ICD-10-CM | POA: Diagnosis not present

## 2017-04-28 DIAGNOSIS — I482 Chronic atrial fibrillation, unspecified: Secondary | ICD-10-CM

## 2017-04-28 DIAGNOSIS — I4892 Unspecified atrial flutter: Secondary | ICD-10-CM | POA: Diagnosis not present

## 2017-04-28 DIAGNOSIS — I4891 Unspecified atrial fibrillation: Secondary | ICD-10-CM | POA: Diagnosis not present

## 2017-04-28 LAB — POCT INR: INR: 2.7

## 2017-04-28 NOTE — Patient Instructions (Signed)
Description   Continue taking 1 tablet daily except 1 and 1/2 tablets on Saturdays.   Recheck INR in 4 weeks.  Call us with any medication changes [phone] to Coumadin clinic.

## 2017-05-05 LAB — HM DIABETES EYE EXAM

## 2017-05-06 ENCOUNTER — Encounter: Payer: Self-pay | Admitting: Internal Medicine

## 2017-05-15 ENCOUNTER — Other Ambulatory Visit: Payer: Self-pay | Admitting: Internal Medicine

## 2017-05-18 ENCOUNTER — Other Ambulatory Visit: Payer: Self-pay | Admitting: *Deleted

## 2017-05-26 ENCOUNTER — Ambulatory Visit (INDEPENDENT_AMBULATORY_CARE_PROVIDER_SITE_OTHER): Payer: Medicare Other | Admitting: *Deleted

## 2017-05-26 DIAGNOSIS — Z5181 Encounter for therapeutic drug level monitoring: Secondary | ICD-10-CM

## 2017-05-26 DIAGNOSIS — Z7901 Long term (current) use of anticoagulants: Secondary | ICD-10-CM | POA: Diagnosis not present

## 2017-05-26 DIAGNOSIS — I4892 Unspecified atrial flutter: Secondary | ICD-10-CM | POA: Diagnosis not present

## 2017-05-26 DIAGNOSIS — I4891 Unspecified atrial fibrillation: Secondary | ICD-10-CM | POA: Diagnosis not present

## 2017-05-26 LAB — POCT INR: INR: 2.5

## 2017-05-26 NOTE — Patient Instructions (Signed)
Description   Continue taking 1 tablet daily except 1 and 1/2 tablets on Saturdays.   Recheck INR in 4 weeks.  Call us with any medication changes [phone] to Coumadin clinic.

## 2017-06-08 ENCOUNTER — Ambulatory Visit: Payer: Medicare Other | Admitting: Internal Medicine

## 2017-06-15 ENCOUNTER — Ambulatory Visit (INDEPENDENT_AMBULATORY_CARE_PROVIDER_SITE_OTHER): Payer: Medicare Other | Admitting: Internal Medicine

## 2017-06-15 ENCOUNTER — Encounter: Payer: Self-pay | Admitting: Internal Medicine

## 2017-06-15 VITALS — BP 115/60 | HR 73 | Temp 98.7°F | Resp 16 | Ht 66.5 in | Wt 197.2 lb

## 2017-06-15 DIAGNOSIS — I4891 Unspecified atrial fibrillation: Secondary | ICD-10-CM | POA: Diagnosis not present

## 2017-06-15 DIAGNOSIS — I159 Secondary hypertension, unspecified: Secondary | ICD-10-CM | POA: Diagnosis not present

## 2017-06-15 DIAGNOSIS — I10 Essential (primary) hypertension: Secondary | ICD-10-CM | POA: Diagnosis not present

## 2017-06-15 LAB — COMPREHENSIVE METABOLIC PANEL
ALBUMIN: 3.7 g/dL (ref 3.5–5.2)
ALT: 13 U/L (ref 0–35)
AST: 19 U/L (ref 0–37)
Alkaline Phosphatase: 113 U/L (ref 39–117)
BUN: 25 mg/dL — ABNORMAL HIGH (ref 6–23)
CALCIUM: 9.3 mg/dL (ref 8.4–10.5)
CHLORIDE: 102 meq/L (ref 96–112)
CO2: 28 mEq/L (ref 19–32)
Creatinine, Ser: 1.8 mg/dL — ABNORMAL HIGH (ref 0.40–1.20)
GFR: 29.11 mL/min — AB (ref >60.00)
Glucose, Bld: 76 mg/dL (ref 70–99)
Potassium: 3.8 mEq/L (ref 3.5–5.1)
Sodium: 139 mEq/L (ref 135–145)
Total Bilirubin: 0.7 mg/dL (ref 0.2–1.2)
Total Protein: 7.2 g/dL (ref 6.0–8.3)

## 2017-06-15 LAB — TSH: TSH: 1.3 u[IU]/mL (ref 0.35–4.50)

## 2017-06-15 NOTE — Assessment & Plan Note (Signed)
HTN: Seems well controlled on Lasix, metoprolol.  Check a CMP Atrial fibrillation: Seems on NSR today, anticoagulated, check a TSH. Anxiety: Symptoms well controlled, continue Lexapro. RTC 6 months.

## 2017-06-15 NOTE — Progress Notes (Signed)
Subjective:    Patient ID: [name] [name], female    DOB: 06-Oct-1940, 77 y.o.   MRN: [id number]  DOS:  06/15/2017 Type of visit - description : rov, here w/ her husband Interval history: In general feeling well, last few months have been uneventful. Good compliance with medication. Emotionally doing well. No recent ambulatory BPs.   Review of Systems  Denies chest pain, difficulty breathing no lower extremity edema No fever chills Past Medical History:  Diagnosis Date  . Atrial fibrillation ([place])    SVT dx 2007, cath 2007 mild CAD, then had an cardioversion, ablation; still on coumadin , has occ palpitation, EKG 03-2010 NSR  . Blindness of left eye   . CKD (chronic kidney disease), stage III ([place]) 06/16/2016   [name] 06/17/2016  . DIABETES MELLITUS, TYPE II 03/27/2006   dr [name]  . Eye muscle weakness    Right eye weakness after cataract surgery  . GERD (gastroesophageal reflux disease) 10/05/2011  . 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 HYPERLIPIDEMIA 03/27/2006  . HYPERTENSION 03/27/2006  . Intertrochanteric fracture of right hip ([place]) 07/13/2012  . 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 Osteopenia 2004   Dexa 2004 showed Osteopenia, DEXA 03/2007 normal  . Osteopenia   . Other chronic cystitis with hematuria   . Pelvic fracture ([place]) 06/16/2016   S/P fall  . Recurrent urinary tract infection    Seeing Urology  . 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 Seizures ([place])   . 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[place]) 0/35/4656    Past Surgical History:  Procedure Laterality Date  . ANKLE FRACTURE SURGERY Bilateral 08/2015   "right one was in 2 places"  . CARDIAC CATHETERIZATION N/A 05/17/2015   Procedure: Left Heart Cath and Coronary Angiography;  Surgeon: Jettie Booze, MD;  Location: [facility];  Service: Cardiovascular;  Laterality: N/A;  . CARDIAC CATHETERIZATION N/A 05/17/2015   Procedure: Coronary Balloon Angioplasty;   Surgeon: Jettie Booze, MD;  Location: [facility];  Service: Cardiovascular;  Laterality: N/A;  . FEMUR IM NAIL Right 07/15/2012   Procedure: INTRAMEDULLARY (IM) NAIL HIP;  Surgeon: Johnny Bridge, MD;  Location: [facility];  Service: Orthopedics;  Laterality: Right;  . FEMUR IM NAIL Left 12/02/2015   Procedure: INTRAMEDULLARY (IM) NAIL FEMORAL;  Surgeon: [name], MD;  Location: [facility];  Service: Orthopedics;  Laterality: Left;  . FRACTURE SURGERY    . INCISION AND DRAINAGE HIP Left 01/03/2016   Procedure: IRRIGATION AND DEBRIDEMENT HIP;  Surgeon: [name], MD;  Location: [facility];  Service: Orthopedics;  Laterality: Left;  . SHOULDER SURGERY Bilateral    "don't remember which side"    Social History   Socioeconomic History  . Marital status: Married    Spouse name: Jenny Reichmann  . Number of children: 1  . Years of education: College  . Highest education level: Not on file  Occupational History  . Occupation: retired     Fish farm manager: RETIRED  Social Needs  . Financial resource strain: Not on file  . Food insecurity:    Worry: Not on file    Inability: Not on file  . Transportation needs:    Medical: Not on file    Non-medical: Not on file  Tobacco Use  . Smoking status: Former Smoker    Types: Cigarettes    Last attempt to quit: 03/02/1978    Years since  quitting: 39.3  . Smokeless tobacco: Never Used  Substance and Sexual Activity  . Alcohol use: No    Alcohol/week: 0.0 oz  . Drug use: No  . Sexual activity: Never  Lifestyle  . Physical activity:    Days per week: Not on file    Minutes per session: Not on file  . Stress: Not on file  Relationships  . Social connections:    Talks on phone: Not on file    Gets together: Not on file    Attends religious service: Not on file    Active member of club or organization: Not on file    Attends meetings of clubs or organizations: Not on file    Relationship status: Not on file  . Intimate partner violence:    Fear of  current or ex partner: Not on file    Emotionally abused: Not on file    Physically abused: Not on file    Forced sexual activity: Not on file  Other Topics Concern  . Not on file  Social History Narrative   Patient lives at home spouse. Uses a walker consistently.   Moved from [place] 2004   1 son, problems w/ drugs, passed away 09-Mar-2016-- OD          Allergies as of 06/15/2017      Reactions   Procaine Hcl Anaphylaxis   Amoxicillin Itching      Medication List        Accurate as of 06/15/17  5:59 PM. Always use your most recent med list.          atorvastatin 80 MG tablet Commonly known as:  LIPITOR Take 1 tablet (80 mg total) by mouth daily.   calcium-vitamin D 500-200 MG-UNIT tablet Commonly known as:  OSCAL WITH D Take 1 tablet by mouth daily.   clopidogrel 75 MG tablet Commonly known as:  PLAVIX Take 1 tablet (75 mg total) by mouth daily.   escitalopram 10 MG tablet Commonly known as:  LEXAPRO TAKE 2 TABLETS BY MOUTH ONCE DAILY   furosemide 40 MG tablet Commonly known as:  LASIX Take 1 tablet (40 mg total) by mouth 2 (two) times daily as needed for fluid or edema.   insulin NPH-regular Human (70-30) 100 UNIT/ML injection Commonly known as:  NOVOLIN 70/30 RELION 35 units with breakfast and 7 units with evening meal.   metoprolol succinate 25 MG 24 hr tablet Commonly known as:  TOPROL-XL Take 1 tablet (25 mg total) by mouth daily. Take with or immediately following meal   MULTIVITAMIN ADULT PO Take 1 tablet by mouth daily with breakfast.   pantoprazole 40 MG tablet Commonly known as:  PROTONIX Take 1 tablet (40 mg total) by mouth daily.   promethazine 12.5 MG tablet Commonly known as:  PHENERGAN Take 1 tablet (12.5 mg total) by mouth every 6 (six) hours as needed for nausea or vomiting.   warfarin 2.5 MG tablet Commonly known as:  COUMADIN Take as directed by the anticoagulation clinic. If you are unsure how to take this medication, talk to your nurse  or doctor. Original instructions:  Take as directed by coumadin clinic          Objective:   Physical Exam BP 115/60 (BP Location: Left Arm, Patient Position: Sitting, Cuff Size: Large)   Pulse 73   Temp 98.7 F (37.1 C) (Oral)   Resp 16   Ht 5' 6.5" (1.689 m)   Wt 197 lb 3.2 oz (89.4 kg)  SpO2 100%   BMI 31.35 kg/m  General:   Well developed, well nourished . NAD.  HEENT:  Normocephalic . Face symmetric, atraumatic Lungs:  CTA B Normal respiratory effort, no intercostal retractions, no accessory muscle use. Heart: seems regular.  No pretibial edema bilaterally  Skin: Not pale. Not jaundice Neurologic:  alert & oriented X3.  Speech normal, gait appropriate for age  Psych--  Cognition and judgment appear intact.  Cooperative with normal attention span and concentration.  Behavior appropriate. No anxious or depressed appearing.      Assessment & Plan:   Assessment   DM  + retinopathy, Dr [name] HTN Hyperlipidemia anxiety ativan GERD Elevated creatinine(started after admission 05-2015, had IV contrast) Anemia, normal iron 05-2015, never had a colonoscopy CV ---NSTEMI 05-2015, cath, angiplasty ---Echo, EF 40 % (05-2015) ---Atrial fibrillation, SVT -- dx 2007, cath 2007 mild CAD. 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  Check a CMP Atrial fibrillation: Seems on NSR today, anticoagulated, check a TSH. Anxiety: Symptoms well controlled, continue Lexapro. RTC 6 months.

## 2017-06-15 NOTE — Patient Instructions (Signed)
GO TO THE LAB : Get the blood work     GO TO THE FRONT DESK Schedule your next appointment for a  Check up in 6 months   

## 2017-06-23 ENCOUNTER — Ambulatory Visit (INDEPENDENT_AMBULATORY_CARE_PROVIDER_SITE_OTHER): Payer: Medicare Other | Admitting: *Deleted

## 2017-06-23 DIAGNOSIS — I4891 Unspecified atrial fibrillation: Secondary | ICD-10-CM

## 2017-06-23 DIAGNOSIS — Z5181 Encounter for therapeutic drug level monitoring: Secondary | ICD-10-CM | POA: Diagnosis not present

## 2017-06-23 DIAGNOSIS — I4892 Unspecified atrial flutter: Secondary | ICD-10-CM | POA: Diagnosis not present

## 2017-06-23 DIAGNOSIS — Z7901 Long term (current) use of anticoagulants: Secondary | ICD-10-CM

## 2017-06-23 LAB — POCT INR: INR: 3.4

## 2017-06-23 NOTE — Patient Instructions (Signed)
Description   Do not take coumadin today April 24th then continue taking 1 tablet daily except 1 and 1/2 tablets on Saturdays.   Recheck INR in 2 weeks.  Call us with any medication changes [phone] to Coumadin clinic.

## 2017-07-07 ENCOUNTER — Ambulatory Visit (INDEPENDENT_AMBULATORY_CARE_PROVIDER_SITE_OTHER): Payer: Medicare Other | Admitting: Endocrinology

## 2017-07-07 ENCOUNTER — Encounter: Payer: Self-pay | Admitting: Endocrinology

## 2017-07-07 VITALS — BP 124/64 | HR 62 | Wt 198.6 lb

## 2017-07-07 DIAGNOSIS — E1159 Type 2 diabetes mellitus with other circulatory complications: Secondary | ICD-10-CM | POA: Diagnosis not present

## 2017-07-07 LAB — POCT GLYCOSYLATED HEMOGLOBIN (HGB A1C): HEMOGLOBIN A1C: 6.7

## 2017-07-07 MED ORDER — INSULIN NPH ISOPHANE & REGULAR (70-30) 100 UNIT/ML ~~LOC~~ SUSP: SUBCUTANEOUS | 11 refills | Status: DC

## 2017-07-07 NOTE — Patient Instructions (Addendum)
Please change the insulin to 28 units with breakfast, 8 units with supper.  On this type of insulin schedule, you should eat meals on a regular schedule.  If a meal is missed or significantly delayed, your blood sugar could go low.   Please come back for a follow-up appointment in 4 months.   check your blood sugar twice a day.  vary the time of day when you check, between before the 3 meals, and at bedtime.  also check if you have symptoms of your blood sugar being too high or too low.  please keep a record of the readings and bring it to your next appointment here.  please call us sooner if your blood sugar goes below 70, or if you have a lot of readings over 200.

## 2017-07-07 NOTE — Progress Notes (Signed)
Subjective:    Patient ID: [name] [name], female    DOB: [birth year], 77 y.o.   MRN: [id number]  HPI Pt returns for f/u of diabetes mellitus: DM type: Insulin-requiring type 2 Dx'ed: 5170 Complications: polyneuropathy, CAD, and retinopathy.  Therapy: insulin since soon after dx.  GDM: never.  DKA: never.  Severe hypoglycemia: last episode was in mid-2018.  Pancreatitis: never.   Other: she is on BID premixed insulin, due to noncompliance with multiple daily injections.  She takes human insulin, due to cost.  Interval history: no cbg record, but states cbg's vary from 48-200's.  It is in general higher as the day goes on.  She has hypoglycemia approx once per month.  She takes 30 units qam and 10 units qpm Past Medical History:  Diagnosis Date  . Atrial fibrillation ([place])    SVT dx 2007, cath 2007 mild CAD, then had an cardioversion, ablation; still on coumadin , has occ palpitation, EKG 03-2010 NSR  . Blindness of left eye   . CKD (chronic kidney disease), stage III ([place]) 06/16/2016   [name] 06/17/2016  . DIABETES MELLITUS, TYPE II 03/27/2006   dr [name]  . Eye muscle weakness    Right eye weakness after cataract surgery  . GERD (gastroesophageal reflux disease) 10/05/2011  . 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 HYPERLIPIDEMIA 03/27/2006  . HYPERTENSION 03/27/2006  . Intertrochanteric fracture of right hip ([place]) 07/13/2012  . 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 Osteopenia 2004   Dexa 2004 showed Osteopenia, DEXA 03/2007 normal  . Osteopenia   . Other chronic cystitis with hematuria   . Pelvic fracture ([place]) 06/16/2016   S/P fall  . Recurrent urinary tract infection    Seeing Urology  . 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 Seizures ([place])   . 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[place]) 0/17/4944    Past Surgical History:  Procedure Laterality Date  . ANKLE FRACTURE SURGERY Bilateral 08/2015   "right one was in 2 places"  . CARDIAC  CATHETERIZATION N/A 05/17/2015   Procedure: Left Heart Cath and Coronary Angiography;  Surgeon: Jettie Booze, MD;  Location: [facility];  Service: Cardiovascular;  Laterality: N/A;  . CARDIAC CATHETERIZATION N/A 05/17/2015   Procedure: Coronary Balloon Angioplasty;  Surgeon: Jettie Booze, MD;  Location: [facility];  Service: Cardiovascular;  Laterality: N/A;  . FEMUR IM NAIL Right 07/15/2012   Procedure: INTRAMEDULLARY (IM) NAIL HIP;  Surgeon: Johnny Bridge, MD;  Location: [facility];  Service: Orthopedics;  Laterality: Right;  . FEMUR IM NAIL Left 12/02/2015   Procedure: INTRAMEDULLARY (IM) NAIL FEMORAL;  Surgeon: [name], MD;  Location: [facility];  Service: Orthopedics;  Laterality: Left;  . FRACTURE SURGERY    . INCISION AND DRAINAGE HIP Left 01/03/2016   Procedure: IRRIGATION AND DEBRIDEMENT HIP;  Surgeon: [name], MD;  Location: [facility];  Service: Orthopedics;  Laterality: Left;  . SHOULDER SURGERY Bilateral    "don't remember which side"    Social History   Socioeconomic History  . Marital status: Married    Spouse name: Jenny Reichmann  . Number of children: 1  . Years of education: College  . Highest education level: Not on file  Occupational History  . Occupation: retired     Fish farm manager: RETIRED  Social Needs  . Financial resource strain: Not on file  . Food insecurity:    Worry: Not  on file    Inability: Not on file  . Transportation needs:    Medical: Not on file    Non-medical: Not on file  Tobacco Use  . Smoking status: Former Smoker    Types: Cigarettes    Last attempt to quit: 03/02/1978    Years since quitting: 39.3  . Smokeless tobacco: Never Used  Substance and Sexual Activity  . Alcohol use: No    Alcohol/week: 0.0 oz  . Drug use: No  . Sexual activity: Never  Lifestyle  . Physical activity:    Days per week: Not on file    Minutes per session: Not on file  . Stress: Not on file  Relationships  . Social connections:    Talks on phone:  Not on file    Gets together: Not on file    Attends religious service: Not on file    Active member of club or organization: Not on file    Attends meetings of clubs or organizations: Not on file    Relationship status: Not on file  . Intimate partner violence:    Fear of current or ex partner: Not on file    Emotionally abused: Not on file    Physically abused: Not on file    Forced sexual activity: Not on file  Other Topics Concern  . Not on file  Social History Narrative   Patient lives at home spouse. Uses a walker consistently.   Moved from [place] 2004   1 son, problems w/ drugs, passed away 2016-03-11-- OD        Current Outpatient Medications on File Prior to Visit  Medication Sig Dispense Refill  . atorvastatin (LIPITOR) 80 MG tablet Take 1 tablet (80 mg total) by mouth daily. 90 tablet 1  . calcium-vitamin D (OSCAL WITH D) 500-200 MG-UNIT per tablet Take 1 tablet by mouth daily.    . clopidogrel (PLAVIX) 75 MG tablet Take 1 tablet (75 mg total) by mouth daily. 90 tablet 1  . escitalopram (LEXAPRO) 10 MG tablet TAKE 2 TABLETS BY MOUTH ONCE DAILY 180 tablet 1  . furosemide (LASIX) 40 MG tablet Take 1 tablet (40 mg total) by mouth 2 (two) times daily as needed for fluid or edema. 180 tablet 1  . metoprolol succinate (TOPROL-XL) 25 MG 24 hr tablet Take 1 tablet (25 mg total) by mouth daily. Take with or immediately following meal 90 tablet 1  . Multiple Vitamins-Minerals (MULTIVITAMIN ADULT PO) Take 1 tablet by mouth daily with breakfast.     . pantoprazole (PROTONIX) 40 MG tablet Take 1 tablet (40 mg total) by mouth daily. 90 tablet 3  . promethazine (PHENERGAN) 12.5 MG tablet Take 1 tablet (12.5 mg total) by mouth every 6 (six) hours as needed for nausea or vomiting. 30 tablet 1  . warfarin (COUMADIN) 2.5 MG tablet Take as directed by coumadin clinic 120 tablet 0   No current facility-administered medications on file prior to visit.     Allergies  Allergen Reactions  . Procaine  Hcl Anaphylaxis  . Amoxicillin Itching    Family History  Problem Relation Age of Onset  . Diabetes Father   . Heart attack Father 77  . Diabetes Sister   . Drug abuse Son   . Cancer Neg Hx        no hx of colon or breast cancer    BP 124/64   Pulse 62   Wt 198 lb 9.6 oz (90.1 kg)   SpO2 98%  BMI 31.57 kg/m    Review of Systems Denies LOC    Objective:   Physical Exam VITAL SIGNS:  See vs page GENERAL: no distress Pulses: dorsalis pedis intact bilat.   MSK: no deformity of the feet CV: no leg edema Skin:  no ulcer on the feet.  normal color and temp on the feet. Neuro: sensation is intact to touch on the feet Ext: There is bilateral onychomycosis of the toenails   Lab Results  Component Value Date   HGBA1C 6.7 07/07/2017      Assessment & Plan:  Insulin-requiring type 2 DM: overcontrolled, given this regimen, which does match insulin to her changing needs throughout the day  Patient Instructions  Please change the insulin to 28 units with breakfast, 8 units with supper.  On this type of insulin schedule, you should eat meals on a regular schedule.  If a meal is missed or significantly delayed, your blood sugar could go low.   Please come back for a follow-up appointment in 4 months.   check your blood sugar twice a day.  vary the time of day when you check, between before the 3 meals, and at bedtime.  also check if you have symptoms of your blood sugar being too high or too low.  please keep a record of the readings and bring it to your next appointment here.  please call us sooner if your blood sugar goes below 70, or if you have a lot of readings over 200.

## 2017-07-08 ENCOUNTER — Ambulatory Visit (INDEPENDENT_AMBULATORY_CARE_PROVIDER_SITE_OTHER): Payer: Medicare Other | Admitting: *Deleted

## 2017-07-08 DIAGNOSIS — Z5181 Encounter for therapeutic drug level monitoring: Secondary | ICD-10-CM

## 2017-07-08 DIAGNOSIS — I482 Chronic atrial fibrillation, unspecified: Secondary | ICD-10-CM

## 2017-07-08 LAB — POCT INR: INR: 1.7

## 2017-07-08 NOTE — Patient Instructions (Signed)
Description   Today May 9th take 1 and 1/2 tablets then  continue taking 1 tablet daily except 1 and 1/2 tablets on Saturdays.   Recheck INR in 2 weeks.  Call us with any medication changes [phone] to Coumadin clinic.

## 2017-07-22 ENCOUNTER — Ambulatory Visit (INDEPENDENT_AMBULATORY_CARE_PROVIDER_SITE_OTHER): Payer: Medicare Other | Admitting: *Deleted

## 2017-07-22 DIAGNOSIS — Z5181 Encounter for therapeutic drug level monitoring: Secondary | ICD-10-CM

## 2017-07-22 DIAGNOSIS — I482 Chronic atrial fibrillation, unspecified: Secondary | ICD-10-CM

## 2017-07-22 LAB — POCT INR: INR: 2.4 (ref 2.0–3.0)

## 2017-07-22 NOTE — Patient Instructions (Signed)
Description   Continue taking 1 tablet daily except 1 and 1/2 tablets on Saturdays.   Recheck INR in 3 weeks.  Call us with any medication changes [phone] to Coumadin clinic.

## 2017-07-29 ENCOUNTER — Other Ambulatory Visit: Payer: Self-pay | Admitting: Family Medicine

## 2017-07-29 ENCOUNTER — Other Ambulatory Visit: Payer: Self-pay | Admitting: Internal Medicine

## 2017-08-12 ENCOUNTER — Ambulatory Visit (INDEPENDENT_AMBULATORY_CARE_PROVIDER_SITE_OTHER): Payer: Medicare Other | Admitting: *Deleted

## 2017-08-12 DIAGNOSIS — Z5181 Encounter for therapeutic drug level monitoring: Secondary | ICD-10-CM

## 2017-08-12 DIAGNOSIS — I482 Chronic atrial fibrillation, unspecified: Secondary | ICD-10-CM

## 2017-08-12 LAB — POCT INR: INR: 2.7 (ref 2.0–3.0)

## 2017-08-12 NOTE — Patient Instructions (Signed)
Description   Continue taking 1 tablet daily except 1 and 1/2 tablets on Saturdays.   Recheck INR in 4 weeks.  Call us with any medication changes [phone] to Coumadin clinic.

## 2017-08-31 ENCOUNTER — Other Ambulatory Visit: Payer: Self-pay | Admitting: Interventional Cardiology

## 2017-09-09 ENCOUNTER — Ambulatory Visit (INDEPENDENT_AMBULATORY_CARE_PROVIDER_SITE_OTHER): Payer: Medicare Other

## 2017-09-09 DIAGNOSIS — Z5181 Encounter for therapeutic drug level monitoring: Secondary | ICD-10-CM | POA: Diagnosis not present

## 2017-09-09 DIAGNOSIS — I482 Chronic atrial fibrillation, unspecified: Secondary | ICD-10-CM

## 2017-09-09 LAB — POCT INR: INR: 4.1 — AB (ref 2.0–3.0)

## 2017-09-09 NOTE — Patient Instructions (Signed)
Description   Skip today's dosage of Coumadin, then resume same dosage 1 tablet daily except 1.5 tablets on Saturdays.   Recheck INR in 2 weeks.  Call us with any medication changes [phone] to Coumadin clinic.

## 2017-09-24 ENCOUNTER — Ambulatory Visit (INDEPENDENT_AMBULATORY_CARE_PROVIDER_SITE_OTHER): Payer: Medicare Other | Admitting: Pharmacist

## 2017-09-24 DIAGNOSIS — I482 Chronic atrial fibrillation, unspecified: Secondary | ICD-10-CM

## 2017-09-24 DIAGNOSIS — Z5181 Encounter for therapeutic drug level monitoring: Secondary | ICD-10-CM | POA: Diagnosis not present

## 2017-09-24 LAB — POCT INR: INR: 3.8 — AB (ref 2.0–3.0)

## 2017-09-24 NOTE — Patient Instructions (Signed)
Description   Skip today's dosage of Coumadin, then resume same dosage 1 tablet daily except 1.5 tablets on Saturdays.   Recheck INR in 2 weeks.  Call us with any medication changes [phone] to Coumadin clinic.

## 2017-10-11 ENCOUNTER — Ambulatory Visit (INDEPENDENT_AMBULATORY_CARE_PROVIDER_SITE_OTHER): Payer: Medicare Other | Admitting: Pharmacist

## 2017-10-11 DIAGNOSIS — I482 Chronic atrial fibrillation, unspecified: Secondary | ICD-10-CM

## 2017-10-11 DIAGNOSIS — Z5181 Encounter for therapeutic drug level monitoring: Secondary | ICD-10-CM | POA: Diagnosis not present

## 2017-10-11 LAB — POCT INR: INR: 2.7 (ref 2.0–3.0)

## 2017-10-11 NOTE — Patient Instructions (Addendum)
Description   Continue taking 1 tablet daily except 1.5 tablets on Saturdays.   Recheck INR in 3.5 weeks.  Call us with any medication changes [phone] to Coumadin clinic.

## 2017-11-04 ENCOUNTER — Ambulatory Visit (INDEPENDENT_AMBULATORY_CARE_PROVIDER_SITE_OTHER): Payer: Medicare Other | Admitting: *Deleted

## 2017-11-04 DIAGNOSIS — Z5181 Encounter for therapeutic drug level monitoring: Secondary | ICD-10-CM

## 2017-11-04 DIAGNOSIS — I482 Chronic atrial fibrillation, unspecified: Secondary | ICD-10-CM

## 2017-11-04 LAB — POCT INR: INR: 3 (ref 2.0–3.0)

## 2017-11-04 NOTE — Patient Instructions (Signed)
Description   Continue taking 1 tablet daily except 1.5 tablets on Saturdays.   Recheck INR in 4 weeks.  Call us with any medication changes [phone] to Coumadin clinic.

## 2017-11-08 ENCOUNTER — Ambulatory Visit (INDEPENDENT_AMBULATORY_CARE_PROVIDER_SITE_OTHER): Payer: Medicare Other | Admitting: Endocrinology

## 2017-11-08 ENCOUNTER — Encounter: Payer: Self-pay | Admitting: Endocrinology

## 2017-11-08 VITALS — BP 130/80 | HR 72 | Ht 66.5 in | Wt 199.8 lb

## 2017-11-08 DIAGNOSIS — E1159 Type 2 diabetes mellitus with other circulatory complications: Secondary | ICD-10-CM

## 2017-11-08 LAB — POCT GLYCOSYLATED HEMOGLOBIN (HGB A1C): HEMOGLOBIN A1C: 7.7 % — AB (ref 4.0–5.6)

## 2017-11-08 MED ORDER — INSULIN NPH ISOPHANE & REGULAR (70-30) 100 UNIT/ML ~~LOC~~ SUSP: SUBCUTANEOUS | 11 refills | Status: DC

## 2017-11-08 NOTE — Patient Instructions (Addendum)
Please reduce the insulin to 27 units with breakfast, 8 units with supper.  On this type of insulin schedule, you should eat meals on a regular schedule.  If a meal is missed or significantly delayed, your blood sugar could go low.   Please keep the scrape on your toe covered with antibiotic ointment, and a bandaid.   Please come back for a follow-up appointment in 4 months.   check your blood sugar twice a day.  vary the time of day when you check, between before the 3 meals, and at bedtime.  also check if you have symptoms of your blood sugar being too high or too low.  please keep a record of the readings and bring it to your next appointment here.  please call us sooner if your blood sugar goes below 70, or if you have a lot of readings over 200.

## 2017-11-08 NOTE — Progress Notes (Signed)
Subjective:    Patient ID: [name] [name], female    DOB: 28-Aug-1940, 77 y.o.   MRN: [id number]  HPI Pt returns for f/u of diabetes mellitus: DM type: Insulin-requiring type 2 Dx'ed: 3299 Complications: polyneuropathy, CAD, renal insuff, and retinopathy.  Therapy: insulin since soon after dx.  GDM: never.  DKA: never.  Severe hypoglycemia: last episode was in mid-2018.  Pancreatitis: never.   Other: she is on BID premixed insulin, due to noncompliance with multiple daily injections.  She takes human insulin, due to cost.  Interval history: no cbg record, but states cbg's vary from 45-200's.  It is in general higher as the day goes on.  She has hypoglycemia approx once per month.  This happens in the afternoon, with exercise.   Past Medical History:  Diagnosis Date  . Atrial fibrillation ([place])    SVT dx 2007, cath 2007 mild CAD, then had an cardioversion, ablation; still on coumadin , has occ palpitation, EKG 03-2010 NSR  . Blindness of left eye   . CKD (chronic kidney disease), stage III ([place]) 06/16/2016   [name] 06/17/2016  . DIABETES MELLITUS, TYPE II 03/27/2006   dr [name]  . Eye muscle weakness    Right eye weakness after cataract surgery  . GERD (gastroesophageal reflux disease) 10/05/2011  . 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 HYPERLIPIDEMIA 03/27/2006  . HYPERTENSION 03/27/2006  . Intertrochanteric fracture of right hip ([place]) 07/13/2012  . 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 Osteopenia 2004   Dexa 2004 showed Osteopenia, DEXA 03/2007 normal  . Osteopenia   . Other chronic cystitis with hematuria   . Pelvic fracture ([place]) 06/16/2016   S/P fall  . Recurrent urinary tract infection    Seeing Urology  . 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 Seizures ([place])   . 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[place]) 2/42/6834    Past Surgical History:  Procedure Laterality Date  . ANKLE FRACTURE SURGERY Bilateral 08/2015   "right one was in 2  places"  . CARDIAC CATHETERIZATION N/A 05/17/2015   Procedure: Left Heart Cath and Coronary Angiography;  Surgeon: Jettie Booze, MD;  Location: [facility];  Service: Cardiovascular;  Laterality: N/A;  . CARDIAC CATHETERIZATION N/A 05/17/2015   Procedure: Coronary Balloon Angioplasty;  Surgeon: Jettie Booze, MD;  Location: [facility];  Service: Cardiovascular;  Laterality: N/A;  . FEMUR IM NAIL Right 07/15/2012   Procedure: INTRAMEDULLARY (IM) NAIL HIP;  Surgeon: Johnny Bridge, MD;  Location: [facility];  Service: Orthopedics;  Laterality: Right;  . FEMUR IM NAIL Left 12/02/2015   Procedure: INTRAMEDULLARY (IM) NAIL FEMORAL;  Surgeon: [name], MD;  Location: [facility];  Service: Orthopedics;  Laterality: Left;  . FRACTURE SURGERY    . INCISION AND DRAINAGE HIP Left 01/03/2016   Procedure: IRRIGATION AND DEBRIDEMENT HIP;  Surgeon: [name], MD;  Location: [facility];  Service: Orthopedics;  Laterality: Left;  . SHOULDER SURGERY Bilateral    "don't remember which side"    Social History   Socioeconomic History  . Marital status: Married    Spouse name: Jenny Reichmann  . Number of children: 1  . Years of education: College  . Highest education level: Not on file  Occupational History  . Occupation: retired     Fish farm manager: RETIRED  Social Needs  . Financial resource strain: Not on file  . Food insecurity:  Worry: Not on file    Inability: Not on file  . Transportation needs:    Medical: Not on file    Non-medical: Not on file  Tobacco Use  . Smoking status: Former Smoker    Types: Cigarettes    Last attempt to quit: 03/02/1978    Years since quitting: 39.7  . Smokeless tobacco: Never Used  Substance and Sexual Activity  . Alcohol use: No    Alcohol/week: 0.0 standard drinks  . Drug use: No  . Sexual activity: Never  Lifestyle  . Physical activity:    Days per week: Not on file    Minutes per session: Not on file  . Stress: Not on file  Relationships  . Social  connections:    Talks on phone: Not on file    Gets together: Not on file    Attends religious service: Not on file    Active member of club or organization: Not on file    Attends meetings of clubs or organizations: Not on file    Relationship status: Not on file  . Intimate partner violence:    Fear of current or ex partner: Not on file    Emotionally abused: Not on file    Physically abused: Not on file    Forced sexual activity: Not on file  Other Topics Concern  . Not on file  Social History Narrative   Patient lives at home spouse. Uses a walker consistently.   Moved from [place] 2004   1 son, problems w/ drugs, passed away 02-22-2016-- OD        Current Outpatient Medications on File Prior to Visit  Medication Sig Dispense Refill  . atorvastatin (LIPITOR) 80 MG tablet Take 1 tablet (80 mg total) by mouth daily. 90 tablet 1  . calcium-vitamin D (OSCAL WITH D) 500-200 MG-UNIT per tablet Take 1 tablet by mouth daily.    . clopidogrel (PLAVIX) 75 MG tablet Take 1 tablet (75 mg total) by mouth daily. 90 tablet 1  . escitalopram (LEXAPRO) 10 MG tablet TAKE 2 TABLETS BY MOUTH ONCE DAILY 180 tablet 1  . furosemide (LASIX) 40 MG tablet Take 1 tablet (40 mg total) by mouth 2 (two) times daily as needed for fluid or edema. 180 tablet 1  . metoprolol succinate (TOPROL-XL) 25 MG 24 hr tablet Take 1 tablet (25 mg total) by mouth daily. Take with or immediately following a meal 90 tablet 1  . Multiple Vitamins-Minerals (MULTIVITAMIN ADULT PO) Take 1 tablet by mouth daily with breakfast.     . pantoprazole (PROTONIX) 40 MG tablet Take 1 tablet (40 mg total) by mouth daily. 90 tablet 3  . promethazine (PHENERGAN) 12.5 MG tablet Take 1 tablet (12.5 mg total) by mouth every 6 (six) hours as needed for nausea or vomiting. 30 tablet 1  . warfarin (COUMADIN) 2.5 MG tablet TAKE AS DIRECTED BY COUMADIN CLINIC 120 tablet 0   No current facility-administered medications on file prior to visit.      Allergies  Allergen Reactions  . Procaine Hcl Anaphylaxis  . Amoxicillin Itching    Family History  Problem Relation Age of Onset  . Diabetes Father   . Heart attack Father 45  . Diabetes Sister   . Drug abuse Son   . Cancer Neg Hx        no hx of colon or breast cancer    BP 130/80   Pulse 72   Ht 5' 6.5" (1.689 m)  Wt 199 lb 12.8 oz (90.6 kg)   SpO2 98%   BMI 31.77 kg/m    Review of Systems Denies LOC.      Objective:   Physical Exam VITAL SIGNS:  See vs page GENERAL: no distress Pulses: dorsalis pedis intact bilat.   MSK: no deformity of the feet CV: trace bilat leg edema Skin:  no ulcer on the feet, but there is an abrasion on the dorsal aspect of the right 3rd toe.  normal color and temp on the feet.  Neuro: sensation is intact to touch on the feet Ext: There is bilateral onychomycosis of the toenails.   Lab Results  Component Value Date   HGBA1C 7.7 (A) 11/08/2017   Lab Results  Component Value Date   CREATININE 1.80 (H) 06/15/2017   BUN 25 (H) 06/15/2017   NA 139 06/15/2017   K 3.8 06/15/2017   CL 102 06/15/2017   CO2 28 06/15/2017      Assessment & Plan:  Insulin-requiring type 2 DM, with DR: this is the best control this pt should aim for, given this regimen, which does match insulin to her changing needs throughout the day Hypoglycemia: this limits aggressiveness of glycemic control Renal insuff: she needs most of her insulin in the morning.     Patient Instructions  Please reduce the insulin to 27 units with breakfast, 8 units with supper.  On this type of insulin schedule, you should eat meals on a regular schedule.  If a meal is missed or significantly delayed, your blood sugar could go low.   Please keep the scrape on your toe covered with antibiotic ointment, and a bandaid.   Please come back for a follow-up appointment in 4 months.   check your blood sugar twice a day.  vary the time of day when you check, between before the 3  meals, and at bedtime.  also check if you have symptoms of your blood sugar being too high or too low.  please keep a record of the readings and bring it to your next appointment here.  please call us sooner if your blood sugar goes below 70, or if you have a lot of readings over 200.

## 2017-11-10 ENCOUNTER — Other Ambulatory Visit: Payer: Self-pay | Admitting: Endocrinology

## 2017-12-02 ENCOUNTER — Ambulatory Visit (INDEPENDENT_AMBULATORY_CARE_PROVIDER_SITE_OTHER): Payer: Medicare Other | Admitting: *Deleted

## 2017-12-02 DIAGNOSIS — Z5181 Encounter for therapeutic drug level monitoring: Secondary | ICD-10-CM

## 2017-12-02 DIAGNOSIS — I482 Chronic atrial fibrillation, unspecified: Secondary | ICD-10-CM

## 2017-12-02 LAB — POCT INR: INR: 2.7 (ref 2.0–3.0)

## 2017-12-02 NOTE — Patient Instructions (Signed)
Description   Continue taking 1 tablet daily except 1.5 tablets on Saturdays.   Recheck INR in 4 weeks.  Call us with any medication changes [phone] to Coumadin clinic.

## 2017-12-07 ENCOUNTER — Other Ambulatory Visit: Payer: Self-pay | Admitting: Endocrinology

## 2017-12-15 ENCOUNTER — Ambulatory Visit (INDEPENDENT_AMBULATORY_CARE_PROVIDER_SITE_OTHER): Payer: Medicare Other | Admitting: Internal Medicine

## 2017-12-15 ENCOUNTER — Encounter: Payer: Self-pay | Admitting: Internal Medicine

## 2017-12-15 VITALS — BP 126/66 | HR 67 | Temp 98.0°F | Resp 16 | Ht 67.0 in | Wt 195.0 lb

## 2017-12-15 DIAGNOSIS — E1159 Type 2 diabetes mellitus with other circulatory complications: Secondary | ICD-10-CM | POA: Diagnosis not present

## 2017-12-15 DIAGNOSIS — Z23 Encounter for immunization: Secondary | ICD-10-CM

## 2017-12-15 DIAGNOSIS — I2583 Coronary atherosclerosis due to lipid rich plaque: Secondary | ICD-10-CM | POA: Diagnosis not present

## 2017-12-15 DIAGNOSIS — D649 Anemia, unspecified: Secondary | ICD-10-CM | POA: Diagnosis not present

## 2017-12-15 DIAGNOSIS — I5021 Acute systolic (congestive) heart failure: Secondary | ICD-10-CM

## 2017-12-15 DIAGNOSIS — I2510 Atherosclerotic heart disease of native coronary artery without angina pectoris: Secondary | ICD-10-CM | POA: Diagnosis not present

## 2017-12-15 DIAGNOSIS — E785 Hyperlipidemia, unspecified: Secondary | ICD-10-CM

## 2017-12-15 DIAGNOSIS — I4891 Unspecified atrial fibrillation: Secondary | ICD-10-CM

## 2017-12-15 LAB — LIPID PANEL
CHOLESTEROL: 131 mg/dL (ref 0–200)
HDL: 46.8 mg/dL (ref >39.00)
LDL CALC: 68 mg/dL (ref 0–99)
NonHDL: 84.45
TRIGLYCERIDES: 83 mg/dL (ref 0.0–149.0)
Total CHOL/HDL Ratio: 3
VLDL: 16.6 mg/dL (ref 0.0–40.0)

## 2017-12-15 LAB — FERRITIN: Ferritin: 47.7 ng/mL (ref 10.0–291.0)

## 2017-12-15 LAB — BASIC METABOLIC PANEL
BUN: 26 mg/dL — ABNORMAL HIGH (ref 6–23)
CHLORIDE: 101 meq/L (ref 96–112)
CO2: 31 mEq/L (ref 19–32)
Calcium: 9.4 mg/dL (ref 8.4–10.5)
Creatinine, Ser: 1.6 mg/dL — ABNORMAL HIGH (ref 0.40–1.20)
GFR: 33.31 mL/min — AB (ref >60.00)
Glucose, Bld: 186 mg/dL — ABNORMAL HIGH (ref 70–99)
POTASSIUM: 4 meq/L (ref 3.5–5.1)
SODIUM: 140 meq/L (ref 135–145)

## 2017-12-15 LAB — CBC WITH DIFFERENTIAL/PLATELET
BASOS ABS: 0.1 10*3/uL (ref 0.0–0.1)
BASOS PCT: 1.2 % (ref 0.0–3.0)
Eosinophils Absolute: 0.4 10*3/uL (ref 0.0–0.7)
Eosinophils Relative: 6 % — ABNORMAL HIGH (ref 0.0–5.0)
HEMATOCRIT: 38.1 % (ref 36.0–46.0)
Hemoglobin: 12.4 g/dL (ref 12.0–15.0)
LYMPHS PCT: 25.3 % (ref 12.0–46.0)
Lymphs Abs: 1.7 10*3/uL (ref 0.7–4.0)
MCHC: 32.6 g/dL (ref 30.0–36.0)
MCV: 89.4 fl (ref 78.0–100.0)
MONOS PCT: 8.8 % (ref 3.0–12.0)
Monocytes Absolute: 0.6 10*3/uL (ref 0.1–1.0)
NEUTROS ABS: 4 10*3/uL (ref 1.4–7.7)
Neutrophils Relative %: 58.7 % (ref 43.0–77.0)
PLATELETS: 310 10*3/uL (ref 150.0–400.0)
RBC: 4.26 Mil/uL (ref 3.87–5.11)
RDW: 14.7 % (ref 11.5–15.5)
WBC: 6.8 10*3/uL (ref 4.0–10.5)

## 2017-12-15 LAB — IRON: Iron: 87 ug/dL (ref 42–145)

## 2017-12-15 NOTE — Patient Instructions (Signed)
GO TO THE LAB : Get the blood work     GO TO THE FRONT DESK Schedule your next appointment for a  Check up in 6 months   

## 2017-12-15 NOTE — Progress Notes (Signed)
Pre visit review using our clinic review tool, if applicable. No additional management support is needed unless otherwise documented below in the visit note. 

## 2017-12-15 NOTE — Progress Notes (Signed)
Subjective:    Patient ID: [name] [name], female    DOB: 28-Sep-1940, 78 y.o.   MRN: [id number]  DOS:  12/15/2017 Type of visit - description : rov, here w/ her husband Interval history:  Reports no major concerns , sees endocrinology regularly and follow-up at the Coumadin clinic without problems.   Review of Systems Denies chest pain or difficulty breathing No nausea, vomiting, diarrhea.  No abdominal pain. No blood in the stools or in the urine.  Past Medical History:  Diagnosis Date  . Atrial fibrillation ([place])    SVT dx 2007, cath 2007 mild CAD, then had an cardioversion, ablation; still on coumadin , has occ palpitation, EKG 03-2010 NSR  . Blindness of left eye   . CKD (chronic kidney disease), stage III ([place]) 06/16/2016   [name] 06/17/2016  . DIABETES MELLITUS, TYPE II 03/27/2006   dr [name]  . Eye muscle weakness    Right eye weakness after cataract surgery  . GERD (gastroesophageal reflux disease) 10/05/2011  . 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 HYPERLIPIDEMIA 03/27/2006  . HYPERTENSION 03/27/2006  . Intertrochanteric fracture of right hip ([place]) 07/13/2012  . 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 Osteopenia 2004   Dexa 2004 showed Osteopenia, DEXA 03/2007 normal  . Osteopenia   . Other chronic cystitis with hematuria   . Pelvic fracture ([place]) 06/16/2016   S/P fall  . Recurrent urinary tract infection    Seeing Urology  . 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 Seizures ([place])   . 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[place]) 10/09/1749    Past Surgical History:  Procedure Laterality Date  . ANKLE FRACTURE SURGERY Bilateral 08/2015   "right one was in 2 places"  . CARDIAC CATHETERIZATION N/A 05/17/2015   Procedure: Left Heart Cath and Coronary Angiography;  Surgeon: Jettie Booze, MD;  Location: [facility];  Service: Cardiovascular;  Laterality: N/A;  . CARDIAC CATHETERIZATION N/A 05/17/2015   Procedure: Coronary  Balloon Angioplasty;  Surgeon: Jettie Booze, MD;  Location: [facility];  Service: Cardiovascular;  Laterality: N/A;  . FEMUR IM NAIL Right 07/15/2012   Procedure: INTRAMEDULLARY (IM) NAIL HIP;  Surgeon: Johnny Bridge, MD;  Location: [facility];  Service: Orthopedics;  Laterality: Right;  . FEMUR IM NAIL Left 12/02/2015   Procedure: INTRAMEDULLARY (IM) NAIL FEMORAL;  Surgeon: [name], MD;  Location: [facility];  Service: Orthopedics;  Laterality: Left;  . FRACTURE SURGERY    . INCISION AND DRAINAGE HIP Left 01/03/2016   Procedure: IRRIGATION AND DEBRIDEMENT HIP;  Surgeon: [name], MD;  Location: [facility];  Service: Orthopedics;  Laterality: Left;  . SHOULDER SURGERY Bilateral    "don't remember which side"    Social History   Socioeconomic History  . Marital status: Married    Spouse name: Jenny Reichmann  . Number of children: 1  . Years of education: College  . Highest education level: Not on file  Occupational History  . Occupation: retired     Fish farm manager: RETIRED  Social Needs  . Financial resource strain: Not on file  . Food insecurity:    Worry: Not on file    Inability: Not on file  . Transportation needs:    Medical: Not on file    Non-medical: Not on file  Tobacco Use  . Smoking status: Former Smoker    Types: Cigarettes    Last attempt to  quit: 03/02/1978    Years since quitting: 39.8  . Smokeless tobacco: Never Used  Substance and Sexual Activity  . Alcohol use: No    Alcohol/week: 0.0 standard drinks  . Drug use: No  . Sexual activity: Not Currently  Lifestyle  . Physical activity:    Days per week: Not on file    Minutes per session: Not on file  . Stress: Not on file  Relationships  . Social connections:    Talks on phone: Not on file    Gets together: Not on file    Attends religious service: Not on file    Active member of club or organization: Not on file    Attends meetings of clubs or organizations: Not on file    Relationship status: Not on file    . Intimate partner violence:    Fear of current or ex partner: Not on file    Emotionally abused: Not on file    Physically abused: Not on file    Forced sexual activity: Not on file  Other Topics Concern  . Not on file  Social History Narrative   Patient lives at home spouse. Uses a walker consistently.   Moved from [place] 2004   1 son, problems w/ drugs, passed away 27-Feb-2016-- OD          Allergies as of 12/15/2017      Reactions   Procaine Hcl Anaphylaxis   Amoxicillin Itching      Medication List        Accurate as of 12/15/17 11:59 PM. Always use your most recent med list.          atorvastatin 80 MG tablet Commonly known as:  LIPITOR Take 1 tablet (80 mg total) by mouth daily.   calcium-vitamin D 500-200 MG-UNIT tablet Commonly known as:  OSCAL WITH D Take 1 tablet by mouth daily.   clopidogrel 75 MG tablet Commonly known as:  PLAVIX Take 1 tablet (75 mg total) by mouth daily.   escitalopram 10 MG tablet Commonly known as:  LEXAPRO TAKE 2 TABLETS BY MOUTH ONCE DAILY   furosemide 40 MG tablet Commonly known as:  LASIX Take 1 tablet (40 mg total) by mouth 2 (two) times daily as needed for fluid or edema.   insulin NPH-regular Human (70-30) 100 UNIT/ML injection Commonly known as:  NOVOLIN 70/30 27 units with breakfast and 8 units with evening meal.   NOVOLIN 70/30 RELION (70-30) 100 UNIT/ML injection Generic drug:  insulin NPH-regular Human INJECT 28 UNITS SUBCUTANEOUSLY WITH BREAKFAST AND THEN INJECT 12 UNITS WITH THE EVENING MEAL   metoprolol succinate 25 MG 24 hr tablet Commonly known as:  TOPROL-XL Take 1 tablet (25 mg total) by mouth daily. Take with or immediately following a meal   MULTIVITAMIN ADULT PO Take 1 tablet by mouth daily with breakfast.   pantoprazole 40 MG tablet Commonly known as:  PROTONIX Take 1 tablet (40 mg total) by mouth daily.   promethazine 12.5 MG tablet Commonly known as:  PHENERGAN Take 1 tablet (12.5 mg total) by  mouth every 6 (six) hours as needed for nausea or vomiting.   warfarin 2.5 MG tablet Commonly known as:  COUMADIN Take as directed by the anticoagulation clinic. If you are unsure how to take this medication, talk to your nurse or doctor. Original instructions:  TAKE AS DIRECTED BY COUMADIN CLINIC          Objective:   Physical Exam BP 126/66 (BP Location: Left  Arm, Patient Position: Sitting, Cuff Size: Small)   Pulse 67   Temp 98 F (36.7 C) (Oral)   Resp 16   Ht 5\' 7"  (1.702 m)   Wt 195 lb (88.5 kg)   SpO2 97%   BMI 30.54 kg/m  General:   Well developed, NAD, see BMI.  HEENT:  Normocephalic . Face symmetric, atraumatic Neck: No thyromegaly, normal carotid pulses. Lungs:  CTA B Normal respiratory effort, no intercostal retractions, no accessory muscle use. Heart: seems regular,  no murmur.  no pretibial edema bilaterally  Abdomen:  Not distended, soft, non-tender. No rebound or rigidity.   Skin: Not pale. Not jaundice Neurologic:  alert & oriented X3.  Speech normal, gait appropriate for age and unassisted Psych--  Cognition and judgment appear intact.  Cooperative with normal attention span and concentration.  Behavior appropriate. No anxious or depressed appearing.     Assessment & Plan:    Assessment   DM  + retinopathy, Dr [name] HTN Hyperlipidemia anxiety ativan GERD Elevated creatinine(started after admission 05-2015, had IV contrast) Anemia, normal iron 05-2015, never had a colonoscopy CV ---NSTEMI 05-2015, cath, angiplasty ---Echo, EF 40 % (05-2015) ---Atrial fibrillation, SVT -- dx 2007, cath 2007 mild CAD. 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), sz episode 08-2015 (?) - HAs Blindness, left eye Right eye weakness after cataract  surgery Osteopenia per DEXA 2004, DEXA 2009 normal, DEXA 2015 @ [facility] -1.8 H/o lung bx 2008 benign  05-2015- admitted [place] sepsis  08-2015 admitted  B tibular Fx, no surgery, SNF 12-2015:   admitted, hip FX , surgery 01-2016: admitted, hip surgery infex   05-2016: Admitted, fall, pelvic fracture, d/c to home 10-02-16: admitted  UTI, nonketotic hyperglycemic syndrome  PLAN: DM: Per Dr. [name], will check a micro today. EFE:[id number] Lasix, metoprolol, check a BMP Hyperlipidemia: On Lipitor 80 mg, check a FLP, no fasting. Anxiety: Doing great emotionally. Anemia: No GI symptoms, check a CBC and iron.  Never had a colonoscopy. CAD, A. fib: Seems to be stable, has not seen cardiology in years, refer to Dr. Oval Linsey Osteopenia: Last T score -1.3 on 08/20/2016, on vitamin D supplementation. Preventive care discussed, flu shot today.  She again declined  further screenings. RTC 6 months

## 2017-12-16 ENCOUNTER — Other Ambulatory Visit: Payer: Medicare Other

## 2017-12-16 LAB — MICROALBUMIN / CREATININE URINE RATIO
Creatinine,U: 58 mg/dL
Microalb Creat Ratio: 9 mg/g (ref 0.0–30.0)
Microalb, Ur: 5.3 mg/dL — ABNORMAL HIGH (ref 0.0–1.9)

## 2017-12-16 NOTE — Assessment & Plan Note (Signed)
DM: Per Dr. [name], will check a micro today. WXI:PPNDLOPR Lasix, metoprolol, check a BMP Hyperlipidemia: On Lipitor 80 mg, check a FLP, no fasting. Anxiety: Doing great emotionally. Anemia: No GI symptoms, check a CBC and iron.  Never had a colonoscopy. CAD, A. fib: Seems to be stable, has not seen cardiology in years, refer to Dr. Oval Linsey Osteopenia: Last T score -1.3 on 08/20/2016, on vitamin D supplementation. Preventive care discussed, flu shot today.  She again declined  further screenings. RTC 6 months

## 2017-12-31 ENCOUNTER — Ambulatory Visit (INDEPENDENT_AMBULATORY_CARE_PROVIDER_SITE_OTHER): Payer: Medicare Other | Admitting: Pharmacist

## 2017-12-31 DIAGNOSIS — I482 Chronic atrial fibrillation, unspecified: Secondary | ICD-10-CM | POA: Diagnosis not present

## 2017-12-31 DIAGNOSIS — Z5181 Encounter for therapeutic drug level monitoring: Secondary | ICD-10-CM

## 2017-12-31 LAB — POCT INR: INR: 2.2 (ref 2.0–3.0)

## 2017-12-31 NOTE — Patient Instructions (Signed)
Description   Continue taking 1 tablet daily except 1.5 tablets on Saturdays.   Recheck INR in 6 weeks.  Call us with any medication changes [phone] to Coumadin clinic.

## 2018-01-01 ENCOUNTER — Other Ambulatory Visit: Payer: Self-pay | Admitting: Internal Medicine

## 2018-01-14 ENCOUNTER — Other Ambulatory Visit: Payer: Self-pay

## 2018-01-21 ENCOUNTER — Other Ambulatory Visit: Payer: Self-pay | Admitting: Internal Medicine

## 2018-02-24 ENCOUNTER — Ambulatory Visit (INDEPENDENT_AMBULATORY_CARE_PROVIDER_SITE_OTHER): Payer: Medicare Other | Admitting: *Deleted

## 2018-02-24 DIAGNOSIS — Z5181 Encounter for therapeutic drug level monitoring: Secondary | ICD-10-CM | POA: Diagnosis not present

## 2018-02-24 DIAGNOSIS — I482 Chronic atrial fibrillation, unspecified: Secondary | ICD-10-CM | POA: Diagnosis not present

## 2018-02-24 LAB — POCT INR: INR: 3.5 — AB (ref 2.0–3.0)

## 2018-02-24 NOTE — Patient Instructions (Signed)
Description   Do not take any Coumadin today then continue taking 1 tablet daily except 1.5 tablets on Saturdays. Recheck INR in 4 weeks with Dr. Oval Linsey at [facility].  Call us with any medication changes [phone] to Coumadin clinic.

## 2018-03-10 ENCOUNTER — Ambulatory Visit (INDEPENDENT_AMBULATORY_CARE_PROVIDER_SITE_OTHER): Payer: Medicare Other | Admitting: Endocrinology

## 2018-03-10 ENCOUNTER — Encounter: Payer: Self-pay | Admitting: Endocrinology

## 2018-03-10 VITALS — BP 120/78 | HR 65 | Ht 67.0 in | Wt 193.6 lb

## 2018-03-10 DIAGNOSIS — E1159 Type 2 diabetes mellitus with other circulatory complications: Secondary | ICD-10-CM

## 2018-03-10 LAB — POCT GLYCOSYLATED HEMOGLOBIN (HGB A1C): Hemoglobin A1C: 7.8 % — AB (ref 4.0–5.6)

## 2018-03-10 MED ORDER — INSULIN NPH ISOPHANE & REGULAR (70-30) 100 UNIT/ML ~~LOC~~ SUSP: SUBCUTANEOUS | 11 refills | Status: DC

## 2018-03-10 NOTE — Patient Instructions (Addendum)
Please change the insulin to 29 units with breakfast, and 6 units with supper.  On this type of insulin schedule, you should eat meals on a regular schedule.  If a meal is missed or significantly delayed, your blood sugar could go low.  Please come back for a follow-up appointment in 4 months.   check your blood sugar twice a day.  vary the time of day when you check, between before the 3 meals, and at bedtime.  also check if you have symptoms of your blood sugar being too high or too low.  please keep a record of the readings and bring it to your next appointment here.  please call us sooner if your blood sugar goes below 70, or if you have a lot of readings over 200.

## 2018-03-10 NOTE — Progress Notes (Signed)
Subjective:    Patient ID: [name] [name], female    DOB: 03-28-1940, 78 y.o.   MRN: [id number]  HPI Pt returns for f/u of diabetes mellitus: DM type: Insulin-requiring type 2 Dx'ed: 3664 Complications: polyneuropathy, CAD, renal insuff, and retinopathy.  Therapy: insulin since soon after dx.  GDM: never.  DKA: never.  Severe hypoglycemia: last episode was in mid-2018.  Pancreatitis: never.   Other: she is on BID premixed insulin, due to noncompliance with multiple daily injections.  She takes human insulin, due to cost.  Interval history: no cbg record, but states cbg's vary from 87-200's.  It is in general higher as the day goes on. pt states she feels well in general. Past Medical History:  Diagnosis Date  . Atrial fibrillation ([place])    SVT dx 2007, cath 2007 mild CAD, then had an cardioversion, ablation; still on coumadin , has occ palpitation, EKG 03-2010 NSR  . Blindness of left eye   . CKD (chronic kidney disease), stage III ([place]) 06/16/2016   [name] 06/17/2016  . DIABETES MELLITUS, TYPE II 03/27/2006   dr [name]  . Eye muscle weakness    Right eye weakness after cataract surgery  . GERD (gastroesophageal reflux disease) 10/05/2011  . 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 HYPERLIPIDEMIA 03/27/2006  . HYPERTENSION 03/27/2006  . Intertrochanteric fracture of right hip ([place]) 07/13/2012  . 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 Osteopenia 2004   Dexa 2004 showed Osteopenia, DEXA 03/2007 normal  . Osteopenia   . Other chronic cystitis with hematuria   . Pelvic fracture ([place]) 06/16/2016   S/P fall  . Recurrent urinary tract infection    Seeing Urology  . 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 Seizures ([place])   . 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[place]) 06/02/4740    Past Surgical History:  Procedure Laterality Date  . ANKLE FRACTURE SURGERY Bilateral 08/2015   "right one was in 2 places"  . CARDIAC CATHETERIZATION N/A 05/17/2015   Procedure: Left Heart Cath and Coronary Angiography;  Surgeon: Jettie Booze, MD;  Location: [facility];  Service: Cardiovascular;  Laterality: N/A;  . CARDIAC CATHETERIZATION N/A 05/17/2015   Procedure: Coronary Balloon Angioplasty;  Surgeon: Jettie Booze, MD;  Location: [facility];  Service: Cardiovascular;  Laterality: N/A;  . FEMUR IM NAIL Right 07/15/2012   Procedure: INTRAMEDULLARY (IM) NAIL HIP;  Surgeon: Johnny Bridge, MD;  Location: [facility];  Service: Orthopedics;  Laterality: Right;  . FEMUR IM NAIL Left 12/02/2015   Procedure: INTRAMEDULLARY (IM) NAIL FEMORAL;  Surgeon: [name], MD;  Location: [facility];  Service: Orthopedics;  Laterality: Left;  . FRACTURE SURGERY    . INCISION AND DRAINAGE HIP Left 01/03/2016   Procedure: IRRIGATION AND DEBRIDEMENT HIP;  Surgeon: [name], MD;  Location: [facility];  Service: Orthopedics;  Laterality: Left;  . SHOULDER SURGERY Bilateral    "don't remember which side"    Social History   Socioeconomic History  . Marital status: Married    Spouse name: Jenny Reichmann  . Number of children: 1  . Years of education: College  . Highest education level: Not on file  Occupational History  . Occupation: retired     Fish farm manager: RETIRED  Social Needs  . Financial resource strain: Not on file  . Food insecurity:    Worry: Not on file    Inability: Not on file  .  Transportation needs:    Medical: Not on file    Non-medical: Not on file  Tobacco Use  . Smoking status: Former Smoker    Types: Cigarettes    Last attempt to quit: 03/02/1978    Years since quitting: 40.0  . Smokeless tobacco: Never Used  Substance and Sexual Activity  . Alcohol use: No    Alcohol/week: 0.0 standard drinks  . Drug use: No  . Sexual activity: Not Currently  Lifestyle  . Physical activity:    Days per week: Not on file    Minutes per session: Not on file  . Stress: Not on file  Relationships  . Social connections:    Talks on phone: Not on file      Gets together: Not on file    Attends religious service: Not on file    Active member of club or organization: Not on file    Attends meetings of clubs or organizations: Not on file    Relationship status: Not on file  . Intimate partner violence:    Fear of current or ex partner: Not on file    Emotionally abused: Not on file    Physically abused: Not on file    Forced sexual activity: Not on file  Other Topics Concern  . Not on file  Social History Narrative   Patient lives at home spouse. Uses a walker consistently.   Moved from [place] 2004   1 son, problems w/ drugs, passed away 2016/02/25-- OD        Current Outpatient Medications on File Prior to Visit  Medication Sig Dispense Refill  . atorvastatin (LIPITOR) 80 MG tablet Take 1 tablet (80 mg total) by mouth daily. 90 tablet 1  . calcium-vitamin D (OSCAL WITH D) 500-200 MG-UNIT per tablet Take 1 tablet by mouth daily.    . clopidogrel (PLAVIX) 75 MG tablet Take 1 tablet (75 mg total) by mouth daily. 90 tablet 1  . furosemide (LASIX) 40 MG tablet Take 1 tablet (40 mg total) by mouth 2 (two) times daily as needed for fluid or edema. 180 tablet 1  . metoprolol succinate (TOPROL-XL) 25 MG 24 hr tablet Take 1 tablet (25 mg total) by mouth daily. Take with or immediately following a meal. 90 tablet 1  . Multiple Vitamins-Minerals (MULTIVITAMIN ADULT PO) Take 1 tablet by mouth daily with breakfast.     . pantoprazole (PROTONIX) 40 MG tablet Take 1 tablet (40 mg total) by mouth daily. 90 tablet 3  . warfarin (COUMADIN) 2.5 MG tablet TAKE AS DIRECTED BY  COUMADIN  CLINIC 120 tablet 1  . escitalopram (LEXAPRO) 10 MG tablet TAKE 2 TABLETS BY MOUTH ONCE DAILY (Patient not taking: Reported on 03/10/2018) 180 tablet 1   No current facility-administered medications on file prior to visit.     Allergies  Allergen Reactions  . Procaine Hcl Anaphylaxis  . Amoxicillin Itching    Family History  Problem Relation Age of Onset  . Diabetes Father    . Heart attack Father 73  . Diabetes Sister   . Drug abuse Son   . Cancer Neg Hx        no hx of colon or breast cancer    BP 120/78 (BP Location: Left Arm, Patient Position: Sitting, Cuff Size: Normal)   Pulse 65   Ht 5\' 7"  (1.702 m)   Wt 193 lb 9.6 oz (87.8 kg)   SpO2 97%   BMI 30.32 kg/m   Review of Systems  She denies hypoglycemia.    Objective:   Physical Exam VITAL SIGNS:  See vs page GENERAL: no distress Pulses: dorsalis pedis intact bilat.   MSK: no deformity of the feet CV: trace bilat leg edema Skin:  no ulcer on the feet, but an abrasion is again noted at the dorsal aspect of the right 3rd toe.  normal color and temp on the feet.  Neuro: sensation is intact to touch on the feet Ext: There is bilateral onychomycosis of the toenails.    Lab Results  Component Value Date   HGBA1C 7.8 (A) 03/10/2018       Assessment & Plan:  Insulin-requiring type 2 DM: this is the best control this pt should aim for, given this regimen, which does match insulin to her changing needs throughout the day.  Renal insuff: Based on the pattern of her cbg's, she needs more am insulin, and less in the PM.   Toe ulcer: persistent.  We'll follow.    Patient Instructions  Please change the insulin to 29 units with breakfast, and 6 units with supper.  On this type of insulin schedule, you should eat meals on a regular schedule.  If a meal is missed or significantly delayed, your blood sugar could go low.  Please come back for a follow-up appointment in 4 months.   check your blood sugar twice a day.  vary the time of day when you check, between before the 3 meals, and at bedtime.  also check if you have symptoms of your blood sugar being too high or too low.  please keep a record of the readings and bring it to your next appointment here.  please call us sooner if your blood sugar goes below 70, or if you have a lot of readings over 200.

## 2018-03-23 ENCOUNTER — Ambulatory Visit: Payer: Medicare Other | Admitting: Cardiovascular Disease

## 2018-04-07 ENCOUNTER — Ambulatory Visit (INDEPENDENT_AMBULATORY_CARE_PROVIDER_SITE_OTHER): Payer: Medicare Other | Admitting: Pharmacist Clinician (PhC)/ Clinical Pharmacy Specialist

## 2018-04-07 ENCOUNTER — Encounter: Payer: Self-pay | Admitting: Cardiology

## 2018-04-07 ENCOUNTER — Ambulatory Visit (INDEPENDENT_AMBULATORY_CARE_PROVIDER_SITE_OTHER): Payer: Medicare Other | Admitting: Cardiology

## 2018-04-07 VITALS — BP 122/62 | HR 59 | Ht 69.5 in | Wt 192.0 lb

## 2018-04-07 DIAGNOSIS — I429 Cardiomyopathy, unspecified: Secondary | ICD-10-CM | POA: Diagnosis not present

## 2018-04-07 DIAGNOSIS — I10 Essential (primary) hypertension: Secondary | ICD-10-CM

## 2018-04-07 DIAGNOSIS — Z5181 Encounter for therapeutic drug level monitoring: Secondary | ICD-10-CM

## 2018-04-07 DIAGNOSIS — IMO0001 Reserved for inherently not codable concepts without codable children: Secondary | ICD-10-CM

## 2018-04-07 DIAGNOSIS — E118 Type 2 diabetes mellitus with unspecified complications: Secondary | ICD-10-CM

## 2018-04-07 DIAGNOSIS — Z794 Long term (current) use of insulin: Secondary | ICD-10-CM

## 2018-04-07 DIAGNOSIS — I482 Chronic atrial fibrillation, unspecified: Secondary | ICD-10-CM

## 2018-04-07 DIAGNOSIS — Z9861 Coronary angioplasty status: Secondary | ICD-10-CM

## 2018-04-07 DIAGNOSIS — N183 Chronic kidney disease, stage 3 unspecified: Secondary | ICD-10-CM

## 2018-04-07 DIAGNOSIS — I480 Paroxysmal atrial fibrillation: Secondary | ICD-10-CM | POA: Diagnosis not present

## 2018-04-07 DIAGNOSIS — I2510 Atherosclerotic heart disease of native coronary artery without angina pectoris: Secondary | ICD-10-CM | POA: Diagnosis not present

## 2018-04-07 LAB — POCT INR: INR: 1.7 — AB (ref 2.0–3.0)

## 2018-04-07 NOTE — Assessment & Plan Note (Signed)
Dr [name] follows- CRI 3b

## 2018-04-07 NOTE — Progress Notes (Signed)
04/07/2018 RENALDA LOCKLIN   1940-10-18  [id number]  Primary Physician Colon Branch, MD Primary Cardiologist: Dr Oval Linsey  HPI: The patient is a pleasant 78 year old female who is been followed by cardiology in the past.  She is here with her husband, he is also a patient of Dr. Blenda Mounts.  Mrs. Floyce Stakes Donia Guiles) has a history of coronary disease.  In March 2017 she had a non-ST elevation MI.  Catheterization then resulted in an OM1 POBA.  Echocardiogram at that time showed her ejection fraction to be 35 to 40%.  She also has a history of hypertension, diabetes, and PAF, on Coumadin.  She is not syncopal seen cardiology since 2017.  She is done well from a cardiac standpoint since we saw her last.  She denies any chest pain unusual dyspnea or tachycardia.  She has had other medical issues, she had bilateral hip surgeries, one became infected and required a prolonged hospitalization and rehab. We did not see her during these admissions.  Fortunately that is all behind her now and she is recovered.     Current Outpatient Medications  Medication Sig Dispense Refill  . atorvastatin (LIPITOR) 80 MG tablet Take 1 tablet (80 mg total) by mouth daily. 90 tablet 1  . calcium-vitamin D (OSCAL WITH D) 500-200 MG-UNIT per tablet Take 1 tablet by mouth daily.    . clopidogrel (PLAVIX) 75 MG tablet Take 1 tablet (75 mg total) by mouth daily. 90 tablet 1  . escitalopram (LEXAPRO) 10 MG tablet TAKE 2 TABLETS BY MOUTH ONCE DAILY 180 tablet 1  . furosemide (LASIX) 40 MG tablet Take 1 tablet (40 mg total) by mouth 2 (two) times daily as needed for fluid or edema. 180 tablet 1  . insulin NPH-regular Human (NOVOLIN 70/30 RELION) (70-30) 100 UNIT/ML injection 29 units with breakfast and 6 units with evening meal. 20 mL 11  . metoprolol succinate (TOPROL-XL) 25 MG 24 hr tablet Take 1 tablet (25 mg total) by mouth daily. Take with or immediately following a meal. 90 tablet 1  . Multiple Vitamins-Minerals  (MULTIVITAMIN ADULT PO) Take 1 tablet by mouth daily with breakfast.     . pantoprazole (PROTONIX) 40 MG tablet Take 1 tablet (40 mg total) by mouth daily. 90 tablet 3  . warfarin (COUMADIN) 2.5 MG tablet TAKE AS DIRECTED BY  COUMADIN  CLINIC 120 tablet 1   No current facility-administered medications for this visit.     Allergies  Allergen Reactions  . Procaine Hcl Anaphylaxis  . Amoxicillin Itching    Past Medical History:  Diagnosis Date  . Atrial fibrillation ([place])    SVT dx 2007, cath 2007 mild CAD, then had an cardioversion, ablation; still on coumadin , has occ palpitation, EKG 03-2010 NSR  . Blindness of left eye   . CKD (chronic kidney disease), stage III ([place]) 06/16/2016   [name] 06/17/2016  . DIABETES MELLITUS, TYPE II 03/27/2006   dr [name]  . Eye muscle weakness    Right eye weakness after cataract surgery  . GERD (gastroesophageal reflux disease) 10/05/2011  . 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 HYPERLIPIDEMIA 03/27/2006  . HYPERTENSION 03/27/2006  . Intertrochanteric fracture of right hip ([place]) 07/13/2012  . 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 Osteopenia 2004   Dexa 2004 showed Osteopenia, DEXA 03/2007 normal  . Osteopenia   . Other chronic cystitis with hematuria   . Pelvic  fracture ([place]) 06/16/2016   S/P fall  . Recurrent urinary tract infection    Seeing Urology  . 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 Seizures ([place])   . 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[place]) 08/13/4313    Social History   Socioeconomic History  . Marital status: Married    Spouse name: Jenny Reichmann  . Number of children: 1  . Years of education: College  . Highest education level: Not on file  Occupational History  . Occupation: retired     Fish farm manager: RETIRED  Social Needs  . Financial resource strain: Not on file  . Food insecurity:    Worry: Not on file    Inability: Not on file  . Transportation needs:    Medical: Not on file    Non-medical: Not  on file  Tobacco Use  . Smoking status: Former Smoker    Types: Cigarettes    Last attempt to quit: 03/02/1978    Years since quitting: 40.1  . Smokeless tobacco: Never Used  Substance and Sexual Activity  . Alcohol use: No    Alcohol/week: 0.0 standard drinks  . Drug use: No  . Sexual activity: Not Currently  Lifestyle  . Physical activity:    Days per week: Not on file    Minutes per session: Not on file  . Stress: Not on file  Relationships  . Social connections:    Talks on phone: Not on file    Gets together: Not on file    Attends religious service: Not on file    Active member of club or organization: Not on file    Attends meetings of clubs or organizations: Not on file    Relationship status: Not on file  . Intimate partner violence:    Fear of current or ex partner: Not on file    Emotionally abused: Not on file    Physically abused: Not on file    Forced sexual activity: Not on file  Other Topics Concern  . Not on file  Social History Narrative   Patient lives at home spouse. Uses a walker consistently.   Moved from [place] 2004   1 son, problems w/ drugs, passed away 03/07/2016-- OD         Family History  Problem Relation Age of Onset  . Diabetes Father   . Heart attack Father 31  . Diabetes Sister   . Drug abuse Son   . Cancer Neg Hx        no hx of colon or breast cancer     Review of Systems: General: negative for chills, fever, night sweats or weight changes.  Cardiovascular: negative for chest pain, dyspnea on exertion, edema, orthopnea, palpitations, paroxysmal nocturnal dyspnea or shortness of breath Dermatological: negative for rash Respiratory: negative for cough or wheezing Urologic: negative for hematuria Abdominal: negative for nausea, vomiting, diarrhea, bright red blood per rectum, melena, or hematemesis Neurologic: negative for visual changes, syncope, or dizziness All other systems reviewed and are otherwise negative except as noted  above.    Blood pressure 122/62, pulse (!) 59, height 5' 9.5" (1.765 m), weight 192 lb (87.1 kg).  General appearance: alert, cooperative, no distress and mildly obese Neck: no carotid bruit and no JVD Lungs: clear to auscultation bilaterally Heart: regular rate and rhythm Extremities: no edema Skin: pale warm and dry Neurologic: Grossly normal  EKG NSR, HR 59, TWI V5-V6 (not seen on prior EKGs)  ASSESSMENT AND PLAN:   CAD S/P percutaneous coronary angioplasty NSTEMI March  2017- OM1 POBA then-  No complaints of chest pain- she does have new TWI V5-V6  Cardiomyopathy ([place]) EF 35-40% 2017-no symptoms of CHF  Atrial fibrillation ([facility]) SVT dx 2007, s/p ablation; still on coumadin, also Plavix (?)  Essential hypertension Controlled  Insulin dependent diabetes mellitus with complications ([place]) Dr [name] follows- CRI 3b  CKD (chronic kidney disease) stage 3, GFR 30-59 ml/min Last GFR 33 Oct 2019   PLAN  No change in Rx.  I'll ask Dr Oval Linsey about Plavix- ? Consider changing to ASA 81 mg.  She is not symptomatic so I did not order an echo. F/U Dr Oval Linsey in 6 months.   Kerin Ransom PA-C 04/07/2018 4:05 PM

## 2018-04-07 NOTE — Progress Notes (Signed)
ekg 

## 2018-04-07 NOTE — Assessment & Plan Note (Signed)
Last GFR 33 Oct 2019

## 2018-04-07 NOTE — Assessment & Plan Note (Signed)
Controlled.  

## 2018-04-07 NOTE — Assessment & Plan Note (Signed)
NSTEMI March 2017- OM1 POBA then-  No complaints of chest pain- she does have new TWI V5-V6

## 2018-04-07 NOTE — Assessment & Plan Note (Signed)
EF 35-40% 2017-no symptoms of CHF

## 2018-04-07 NOTE — Assessment & Plan Note (Signed)
SVT dx 2007, s/p ablation; still on coumadin, also Plavix (?)

## 2018-04-07 NOTE — Patient Instructions (Signed)
Medication Instructions:  Your physician recommends that you continue on your current medications as directed. Please refer to the Current Medication list given to you today. If you need a refill on your cardiac medications before your next appointment, please call your pharmacy.   Lab work: None  If you have labs (blood work) drawn today and your tests are completely normal, you will receive your results only by: [name] MyChart Message (if you have MyChart) OR . A paper copy in the mail If you have any lab test that is abnormal or we need to change your treatment, we will call you to review the results.  Testing/Procedures: None   Follow-Up: At [facility], you and your health needs are our priority.  As part of our continuing mission to provide you with exceptional heart care, we have created designated Provider Care Teams.  These Care Teams include your primary Cardiologist (physician) and Advanced Practice Providers (APPs -  Physician Assistants and Nurse Practitioners) who all work together to provide you with the care you need, when you need it. You will need a follow up appointment in 6 months.  Please call our office 2 months in advance to schedule this appointment.  You may see Dr Skeet Latch or one of the following Advanced Practice Providers on your designated Care Team:   Kerin Ransom, PA-C Roby Lofts, [place] . Sande Rives, PA-C  Any Other Special Instructions Will Be Listed Below (If Applicable).

## 2018-04-09 NOTE — Progress Notes (Signed)
It would be OK to stop clopidogrel and switch to aspirin 81mg  since it has been more than a year since her event. Thanks.

## 2018-04-12 ENCOUNTER — Telehealth: Payer: Self-pay

## 2018-04-12 MED ORDER — ASPIRIN EC 81 MG PO TBEC: 81.0000 mg | DELAYED_RELEASE_TABLET | Freq: Every day | ORAL | 3 refills | Status: DC

## 2018-04-12 NOTE — Telephone Encounter (Signed)
Patient has been notified directly and voiced understanding.

## 2018-04-12 NOTE — Telephone Encounter (Signed)
-----   Message from Erlene Quan, [place] sent at 04/10/2018 10:23 AM EST ----- T please let this [patient know she can stop Plavix and switch to one ASA 81 mg daily  Thanks  Kerin Ransom PA-C 04/10/2018 10:24 AM   ----- Message ----- From: Skeet Latch, MD Sent: 04/09/2018   7:28 PM EST To: Erlene Quan, PA-C, Earvin Hansen, LPN    ----- Message ----- From: Ilean China Sent: 04/07/2018   4:07 PM EST To: Skeet Latch, MD  ? Plavix and Coumadin

## 2018-04-14 ENCOUNTER — Telehealth: Payer: Self-pay | Admitting: *Deleted

## 2018-04-14 NOTE — Telephone Encounter (Signed)
  Advised patient, verbalized understanding    Author: Skeet Latch, MD Service: Cardiology Author Type: Physician  Filed: 04/09/2018 7:28 PM Encounter Date: 04/07/2018 Status: Signed  Editor: Skeet Latch, MD (Physician)     Show:Clear all [x] Manual[] Template[] Copied  Added by: [x] [place], Tiffany, MD  [] Hover for details It would be OK to stop clopidogrel and switch to aspirin 81mg  since it has been more than a year since her event. Thanks.

## 2018-04-14 NOTE — Progress Notes (Signed)
Advised patient, verbalized understanding  

## 2018-04-14 NOTE — Telephone Encounter (Signed)
-----   Message from Skeet Latch, MD sent at 04/09/2018  7:28 PM EST -----   ----- Message ----- From: Ilean China Sent: 04/07/2018   4:07 PM EST To: Skeet Latch, MD  ? Plavix and Coumadin

## 2018-04-27 ENCOUNTER — Ambulatory Visit (INDEPENDENT_AMBULATORY_CARE_PROVIDER_SITE_OTHER): Payer: Medicare Other | Admitting: Pharmacist

## 2018-04-27 DIAGNOSIS — Z5181 Encounter for therapeutic drug level monitoring: Secondary | ICD-10-CM | POA: Diagnosis not present

## 2018-04-27 DIAGNOSIS — I482 Chronic atrial fibrillation, unspecified: Secondary | ICD-10-CM

## 2018-04-27 LAB — POCT INR: INR: 3.9 — AB (ref 2.0–3.0)

## 2018-04-27 NOTE — Patient Instructions (Signed)
Description   No warfarin today  then continue taking 1 tablet daily except 1.5 tablets on Saturdays. Recheck INR in 3 weeks.  Call us with any medication changes [phone] to Coumadin clinic.

## 2018-05-05 ENCOUNTER — Ambulatory Visit: Payer: Medicare Other | Admitting: Cardiovascular Disease

## 2018-05-18 ENCOUNTER — Ambulatory Visit (INDEPENDENT_AMBULATORY_CARE_PROVIDER_SITE_OTHER): Payer: Medicare Other | Admitting: Pharmacist

## 2018-05-18 ENCOUNTER — Other Ambulatory Visit: Payer: Self-pay

## 2018-05-18 DIAGNOSIS — I482 Chronic atrial fibrillation, unspecified: Secondary | ICD-10-CM | POA: Diagnosis not present

## 2018-05-18 DIAGNOSIS — Z5181 Encounter for therapeutic drug level monitoring: Secondary | ICD-10-CM | POA: Diagnosis not present

## 2018-05-18 LAB — POCT INR: INR: 2.4 (ref 2.0–3.0)

## 2018-05-18 NOTE — Patient Instructions (Signed)
Description   Continue taking 1 tablet daily except 1.5 tablets on Saturdays. Recheck INR in 4 weeks.  Call us with any medication changes [phone] to Coumadin clinic.

## 2018-06-02 ENCOUNTER — Encounter (HOSPITAL_BASED_OUTPATIENT_CLINIC_OR_DEPARTMENT_OTHER): Payer: Self-pay | Admitting: Emergency Medicine

## 2018-06-02 ENCOUNTER — Other Ambulatory Visit: Payer: Self-pay

## 2018-06-02 ENCOUNTER — Emergency Department (HOSPITAL_BASED_OUTPATIENT_CLINIC_OR_DEPARTMENT_OTHER)
Admission: EM | Admit: 2018-06-02 | Discharge: 2018-06-02 | Disposition: A | Discharge status: Home / Self Care | Payer: Medicare Other | Attending: Emergency Medicine | Admitting: Emergency Medicine

## 2018-06-02 ENCOUNTER — Ambulatory Visit (INDEPENDENT_AMBULATORY_CARE_PROVIDER_SITE_OTHER): Payer: Medicare Other | Admitting: Pharmacist

## 2018-06-02 DIAGNOSIS — R58 Hemorrhage, not elsewhere classified: Secondary | ICD-10-CM | POA: Diagnosis present

## 2018-06-02 DIAGNOSIS — E1122 Type 2 diabetes mellitus with diabetic chronic kidney disease: Secondary | ICD-10-CM | POA: Diagnosis not present

## 2018-06-02 DIAGNOSIS — I5020 Unspecified systolic (congestive) heart failure: Secondary | ICD-10-CM | POA: Diagnosis not present

## 2018-06-02 DIAGNOSIS — N183 Chronic kidney disease, stage 3 (moderate): Secondary | ICD-10-CM | POA: Insufficient documentation

## 2018-06-02 DIAGNOSIS — I480 Paroxysmal atrial fibrillation: Secondary | ICD-10-CM | POA: Diagnosis not present

## 2018-06-02 DIAGNOSIS — Z5189 Encounter for other specified aftercare: Secondary | ICD-10-CM | POA: Insufficient documentation

## 2018-06-02 DIAGNOSIS — I130 Hypertensive heart and chronic kidney disease with heart failure and stage 1 through stage 4 chronic kidney disease, or unspecified chronic kidney disease: Secondary | ICD-10-CM | POA: Diagnosis not present

## 2018-06-02 DIAGNOSIS — Z7901 Long term (current) use of anticoagulants: Secondary | ICD-10-CM | POA: Diagnosis not present

## 2018-06-02 DIAGNOSIS — Z5181 Encounter for therapeutic drug level monitoring: Secondary | ICD-10-CM | POA: Diagnosis not present

## 2018-06-02 DIAGNOSIS — R791 Abnormal coagulation profile: Secondary | ICD-10-CM | POA: Insufficient documentation

## 2018-06-02 DIAGNOSIS — Z87891 Personal history of nicotine dependence: Secondary | ICD-10-CM | POA: Insufficient documentation

## 2018-06-02 LAB — PROTIME-INR
INR: 4 — ABNORMAL HIGH (ref 0.8–1.2)
Prothrombin Time: 38.5 seconds — ABNORMAL HIGH (ref 11.4–15.2)

## 2018-06-02 MED ORDER — SILVER NITRATE-POT NITRATE 75-25 % EX MISC
CUTANEOUS | Status: AC
  Filled 2018-06-02: qty 1

## 2018-06-02 MED ORDER — SILVER NITRATE-POT NITRATE 75-25 % EX MISC: 1.0000 | Freq: Once | CUTANEOUS | Status: DC

## 2018-06-02 NOTE — ED Triage Notes (Signed)
Pt has a small lac to L arm that has been oozing blood since yesterday.

## 2018-06-02 NOTE — ED Notes (Signed)
ED Provider at bedside. 

## 2018-06-02 NOTE — ED Notes (Signed)
No further bleeding noted to wound from LFA, refused to have a bandage applied

## 2018-06-02 NOTE — Discharge Instructions (Addendum)
You were evaluated in the Emergency Department and after careful evaluation, we did not find any emergent condition requiring admission or further testing in the hospital.  Your symptoms today seem to be due to persistent bleeding related to your elevated INR level, which was measured at 4 today in the ED. please do not take your Coumadin today until you discuss this with your primary care doctor.  Please call them either today or tomorrow.  Please return to the Emergency Department if you experience any worsening of your condition.  We encourage you to follow up with a primary care provider.  Thank you for allowing [place] to be a part of your care.

## 2018-06-02 NOTE — ED Provider Notes (Signed)
[facility] Emergency Department Provider Note MRN:  [id number]  Arrival date & time: 06/02/18     Chief Complaint   Wound Check   History of Present Illness   [name] [name] is a 78 y.o. year-old female with a history of A. fib on Coumadin presenting to the ED with chief complaint of wound check.  Patient explains that she has a small cut or abrasion to her left forearm that is been bleeding persistently for the past 24 hours.  Does not remember how she sustained the injury.  Has tried holding pressure, has tried multiple bandages, persistently bleeding through the bandages.  Denies any other symptoms.  Had her PT checked 2 weeks ago and it was within normal limits.  However her PT level has been fluctuating over the past few months.  Review of Systems  A complete 10 system review of systems was obtained and all systems are negative except as noted in the HPI and PMH.   Patient's Health History    Past Medical History:  Diagnosis Date  . Atrial fibrillation ([place])    SVT dx 2007, cath 2007 mild CAD, then had an cardioversion, ablation; still on coumadin , has occ palpitation, EKG 03-2010 NSR  . Blindness of left eye   . CKD (chronic kidney disease), stage III ([place]) 06/16/2016   [name] 06/17/2016  . DIABETES MELLITUS, TYPE II 03/27/2006   dr [name]  . Eye muscle weakness    Right eye weakness after cataract surgery  . GERD (gastroesophageal reflux disease) 10/05/2011  . 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 HYPERLIPIDEMIA 03/27/2006  . HYPERTENSION 03/27/2006  . Intertrochanteric fracture of right hip ([place]) 07/13/2012  . 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 Osteopenia 2004   Dexa 2004 showed Osteopenia, DEXA 03/2007 normal  . Osteopenia   . Other chronic cystitis with hematuria   . Pelvic fracture ([place]) 06/16/2016   S/P fall  . Recurrent urinary tract infection    Seeing Urology  . RETINOPATHY,  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  . Seizures ([place])   . 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[place]) 11/24/4626    Past Surgical History:  Procedure Laterality Date  . ANKLE FRACTURE SURGERY Bilateral 08/2015   "right one was in 2 places"  . CARDIAC CATHETERIZATION N/A 05/17/2015   Procedure: Left Heart Cath and Coronary Angiography;  Surgeon: Jettie Booze, MD;  Location: [facility];  Service: Cardiovascular;  Laterality: N/A;  . CARDIAC CATHETERIZATION N/A 05/17/2015   Procedure: Coronary Balloon Angioplasty;  Surgeon: Jettie Booze, MD;  Location: [facility];  Service: Cardiovascular;  Laterality: N/A;  . FEMUR IM NAIL Right 07/15/2012   Procedure: INTRAMEDULLARY (IM) NAIL HIP;  Surgeon: Johnny Bridge, MD;  Location: [facility];  Service: Orthopedics;  Laterality: Right;  . FEMUR IM NAIL Left 12/02/2015   Procedure: INTRAMEDULLARY (IM) NAIL FEMORAL;  Surgeon: [name], MD;  Location: [facility];  Service: Orthopedics;  Laterality: Left;  . FRACTURE SURGERY    . INCISION AND DRAINAGE HIP Left 01/03/2016   Procedure: IRRIGATION AND DEBRIDEMENT HIP;  Surgeon: [name], MD;  Location: [facility];  Service: Orthopedics;  Laterality: Left;  . SHOULDER SURGERY Bilateral    "don't remember which side"    Family History  Problem Relation Age of Onset  . Diabetes Father   . Heart attack Father 56  . Diabetes Sister   .  Drug abuse Son   . Cancer Neg Hx        no hx of colon or breast cancer    Social History   Socioeconomic History  . Marital status: Married    Spouse name: Jenny Reichmann  . Number of children: 1  . Years of education: College  . Highest education level: Not on file  Occupational History  . Occupation: retired     Fish farm manager: RETIRED  Social Needs  . Financial resource strain: Not on file  . Food insecurity:    Worry: Not on file    Inability: Not on file  . Transportation needs:    Medical: Not on file    Non-medical: Not on file  Tobacco Use  . Smoking status: Former Smoker    Types:  Cigarettes    Last attempt to quit: 03/02/1978    Years since quitting: 40.2  . Smokeless tobacco: Never Used  Substance and Sexual Activity  . Alcohol use: No    Alcohol/week: 0.0 standard drinks  . Drug use: No  . Sexual activity: Not Currently  Lifestyle  . Physical activity:    Days per week: Not on file    Minutes per session: Not on file  . Stress: Not on file  Relationships  . Social connections:    Talks on phone: Not on file    Gets together: Not on file    Attends religious service: Not on file    Active member of club or organization: Not on file    Attends meetings of clubs or organizations: Not on file    Relationship status: Not on file  . Intimate partner violence:    Fear of current or ex partner: Not on file    Emotionally abused: Not on file    Physically abused: Not on file    Forced sexual activity: Not on file  Other Topics Concern  . Not on file  Social History Narrative   Patient lives at home spouse. Uses a walker consistently.   Moved from [place] 2004   1 son, problems w/ drugs, passed away 2016-03-20-- OD         Physical Exam  Vital Signs and Nursing Notes reviewed Vitals:   06/02/18 0811  BP: (!) 170/57  Pulse: 67  Resp: 18  Temp: 97.6 F (36.4 C)  SpO2: 97%    CONSTITUTIONAL: Well-appearing, NAD NEURO:  Alert and oriented x 3, no focal deficits EYES:  eyes equal and reactive ENT/NECK:  no LAD, no JVD CARDIO: Regular rate, well-perfused, normal S1 and S2 PULM:  CTAB no wheezing or rhonchi GI/GU:  normal bowel sounds, non-distended, non-tender MSK/SPINE:  No gross deformities, no edema SKIN:  no rash, 1.5 cm abrasion to the mid left forearm with small oozing of blood PSYCH:  Appropriate speech and behavior  Diagnostic and Interventional Summary    Labs Reviewed  PROTIME-INR - Abnormal; Notable for the following components:      Result Value   Prothrombin Time 38.5 (*)    INR 4.0 (*)    All other components within normal limits     No orders to display    Medications  silver nitrate applicators applicator 1 Stick (has no administration in time range)     Cauterization Date/Time: 06/02/2018 10:03 AM Performed by: Maudie Flakes, MD Authorized by: Maudie Flakes, MD  Consent: Verbal consent obtained. Risks and benefits: risks, benefits and alternatives were discussed Consent given by: patient Local anesthesia used: no  Anesthesia: Local anesthesia used: no  Sedation: Patient sedated: no  Patient tolerance: Patient tolerated the procedure well with no immediate complications Comments: Elevated INR causing small but persistent bleeding to abrasion of the left forearm.  Cauterized using silver nitrate.  Reevaluated 20 to 30 minutes after the procedure, hemostasis achieved.    Critical Care  ED Course and Medical Decision Making  I have reviewed the triage vital signs and the nursing notes.  Pertinent labs & imaging results that were available during my care of the patient were reviewed by me and considered in my medical decision making (see below for details).  Will screen PT given her recent elevated levels according to chart review, will use silver nitrate to cauterize the wound here to aid with hemostasis.  Successful cauterization with silver nitrate.  PT is confirmed to be elevated at 4.0.  Advised patient not to take her Coumadin dose today, patient agrees to call her PCP when she gets home today to discuss her warfarin dosing.  After the discussed management above, the patient was determined to be safe for discharge.  The patient was in agreement with this plan and all questions regarding their care were answered.  ED return precautions were discussed and the patient will return to the ED with any significant worsening of condition.  Barth Kirks. Sedonia Small, MD [place] [email or web address]  Final Clinical Impressions(s) / ED Diagnoses     ICD-10-CM   1.  Visit for wound check Z51.89   2. Bleeding R58   3. Elevated INR R79.1     ED Discharge Orders    None         Maudie Flakes, MD 06/02/18 1005

## 2018-06-02 NOTE — ED Notes (Signed)
Patient is resting comfortably. 

## 2018-06-08 ENCOUNTER — Telehealth: Payer: Self-pay

## 2018-06-08 NOTE — Telephone Encounter (Signed)

## 2018-06-09 ENCOUNTER — Other Ambulatory Visit: Payer: Self-pay

## 2018-06-09 ENCOUNTER — Ambulatory Visit (INDEPENDENT_AMBULATORY_CARE_PROVIDER_SITE_OTHER): Payer: Medicare Other | Admitting: Pharmacist

## 2018-06-09 DIAGNOSIS — I480 Paroxysmal atrial fibrillation: Secondary | ICD-10-CM

## 2018-06-09 DIAGNOSIS — I482 Chronic atrial fibrillation, unspecified: Secondary | ICD-10-CM

## 2018-06-09 DIAGNOSIS — Z5181 Encounter for therapeutic drug level monitoring: Secondary | ICD-10-CM | POA: Diagnosis not present

## 2018-06-09 LAB — POCT INR: INR: 1.5 — AB (ref 2.0–3.0)

## 2018-06-16 ENCOUNTER — Telehealth: Payer: Self-pay

## 2018-06-16 ENCOUNTER — Telehealth: Payer: Self-pay | Admitting: Pharmacist

## 2018-06-16 ENCOUNTER — Other Ambulatory Visit: Payer: Self-pay

## 2018-06-16 ENCOUNTER — Ambulatory Visit (INDEPENDENT_AMBULATORY_CARE_PROVIDER_SITE_OTHER): Payer: Medicare Other | Admitting: Internal Medicine

## 2018-06-16 ENCOUNTER — Encounter: Payer: Self-pay | Admitting: Internal Medicine

## 2018-06-16 DIAGNOSIS — I4891 Unspecified atrial fibrillation: Secondary | ICD-10-CM

## 2018-06-16 DIAGNOSIS — E1159 Type 2 diabetes mellitus with other circulatory complications: Secondary | ICD-10-CM | POA: Diagnosis not present

## 2018-06-16 DIAGNOSIS — I10 Essential (primary) hypertension: Secondary | ICD-10-CM

## 2018-06-16 DIAGNOSIS — I2510 Atherosclerotic heart disease of native coronary artery without angina pectoris: Secondary | ICD-10-CM | POA: Diagnosis not present

## 2018-06-16 DIAGNOSIS — Z9861 Coronary angioplasty status: Secondary | ICD-10-CM | POA: Diagnosis not present

## 2018-06-16 DIAGNOSIS — E870 Hyperosmolality and hypernatremia: Secondary | ICD-10-CM

## 2018-06-16 NOTE — Telephone Encounter (Signed)
Copied from [place] [id number]. Topic: Appointment Scheduling - Scheduling Inquiry for Clinic >> Jun 16, 2018 11:28 AM Lennox Solders wrote: Reason for CRM: Pt had an appt with dr Larose Kells today and needs to make 3 month follow up appointment. Pt would like to come on 09/07/2018 with her husband. Called office x 3 line busy

## 2018-06-16 NOTE — Progress Notes (Signed)
Subjective:    Patient ID: [name] [name], female    DOB: November 06, 1940, 78 y.o.   MRN: [id number]  DOS:  06/16/2018 Type of visit - description: Virtual Visit via Video Note  I connected with@ on 06/16/18 at 11:00 AM EDT by a video enabled telemedicine application and verified that I am speaking with the correct person using two identifiers.   THIS ENCOUNTER IS A VIRTUAL VISIT DUE TO COVID-19 - PATIENT WAS NOT SEEN IN THE OFFICE. PATIENT HAS CONSENTED TO VIRTUAL VISIT / TELEMEDICINE VISIT   Location of patient: home  Location of provider: office  I discussed the limitations of evaluation and management by telemedicine and the availability of in person appointments. The patient expressed understanding and agreed to proceed.  History of Present Illness: Virtual visit In general feeling well. Saw cardiology 04/2018, note reviewed. Coronavirus: She is following good hygiene measures. HTN: Ambulatory BPs fluctuate We reviewed together her medications and her labs.   Review of Systems Denies fever chills No cough No chest pain, difficulty breathing or palpitations  Past Medical History:  Diagnosis Date  . Atrial fibrillation ([place])    SVT dx 2007, cath 2007 mild CAD, then had an cardioversion, ablation; still on coumadin , has occ palpitation, EKG 03-2010 NSR  . Blindness of left eye   . CKD (chronic kidney disease), stage III ([place]) 06/16/2016   [name] 06/17/2016  . DIABETES MELLITUS, TYPE II 03/27/2006   dr [name]  . Eye muscle weakness    Right eye weakness after cataract surgery  . GERD (gastroesophageal reflux disease) 10/05/2011  . 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 HYPERLIPIDEMIA 03/27/2006  . HYPERTENSION 03/27/2006  . Intertrochanteric fracture of right hip ([place]) 07/13/2012  . 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 Osteopenia 2004   Dexa 2004 showed Osteopenia, DEXA 03/2007 normal  . Osteopenia   . Other chronic  cystitis with hematuria   . Pelvic fracture ([place]) 06/16/2016   S/P fall  . Recurrent urinary tract infection    Seeing Urology  . 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 Seizures ([place])   . 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[place]) 06/09/7351    Past Surgical History:  Procedure Laterality Date  . ANKLE FRACTURE SURGERY Bilateral 08/2015   "right one was in 2 places"  . CARDIAC CATHETERIZATION N/A 05/17/2015   Procedure: Left Heart Cath and Coronary Angiography;  Surgeon: Jettie Booze, MD;  Location: [facility];  Service: Cardiovascular;  Laterality: N/A;  . CARDIAC CATHETERIZATION N/A 05/17/2015   Procedure: Coronary Balloon Angioplasty;  Surgeon: Jettie Booze, MD;  Location: [facility];  Service: Cardiovascular;  Laterality: N/A;  . FEMUR IM NAIL Right 07/15/2012   Procedure: INTRAMEDULLARY (IM) NAIL HIP;  Surgeon: Johnny Bridge, MD;  Location: [facility];  Service: Orthopedics;  Laterality: Right;  . FEMUR IM NAIL Left 12/02/2015   Procedure: INTRAMEDULLARY (IM) NAIL FEMORAL;  Surgeon: [name], MD;  Location: [facility];  Service: Orthopedics;  Laterality: Left;  . FRACTURE SURGERY    . INCISION AND DRAINAGE HIP Left 01/03/2016   Procedure: IRRIGATION AND DEBRIDEMENT HIP;  Surgeon: [name], MD;  Location: [facility];  Service: Orthopedics;  Laterality: Left;  . SHOULDER SURGERY Bilateral    "don't remember which side"    Social History   Socioeconomic History  . Marital status: Married    Spouse name: Jenny Reichmann  .  Number of children: 1  . Years of education: College  . Highest education level: Not on file  Occupational History  . Occupation: retired     Fish farm manager: RETIRED  Social Needs  . Financial resource strain: Not on file  . Food insecurity:    Worry: Not on file    Inability: Not on file  . Transportation needs:    Medical: Not on file    Non-medical: Not on file  Tobacco Use  . Smoking status: Former Smoker    Types: Cigarettes    Last attempt to quit: 03/02/1978     Years since quitting: 40.3  . Smokeless tobacco: Never Used  Substance and Sexual Activity  . Alcohol use: No    Alcohol/week: 0.0 standard drinks  . Drug use: No  . Sexual activity: Not Currently  Lifestyle  . Physical activity:    Days per week: Not on file    Minutes per session: Not on file  . Stress: Not on file  Relationships  . Social connections:    Talks on phone: Not on file    Gets together: Not on file    Attends religious service: Not on file    Active member of club or organization: Not on file    Attends meetings of clubs or organizations: Not on file    Relationship status: Not on file  . Intimate partner violence:    Fear of current or ex partner: Not on file    Emotionally abused: Not on file    Physically abused: Not on file    Forced sexual activity: Not on file  Other Topics Concern  . Not on file  Social History Narrative   Patient lives at home spouse. Uses a walker consistently.   Moved from [place] 2004   1 son, problems w/ drugs, passed away Mar 09, 2016-- OD          Allergies as of 06/16/2018      Reactions   Procaine Hcl Anaphylaxis   Amoxicillin Itching      Medication List       Accurate as of June 16, 2018 11:11 AM. Always use your most recent med list.        aspirin EC 81 MG tablet Take 1 tablet (81 mg total) by mouth daily.   atorvastatin 80 MG tablet Commonly known as:  LIPITOR Take 1 tablet (80 mg total) by mouth daily.   calcium-vitamin D 500-200 MG-UNIT tablet Commonly known as:  OSCAL WITH D Take 1 tablet by mouth daily.   escitalopram 10 MG tablet Commonly known as:  LEXAPRO TAKE 2 TABLETS BY MOUTH ONCE DAILY   furosemide 40 MG tablet Commonly known as:  LASIX Take 1 tablet (40 mg total) by mouth 2 (two) times daily as needed for fluid or edema.   insulin NPH-regular Human (70-30) 100 UNIT/ML injection Commonly known as:  NovoLIN 70/30 ReliOn 29 units with breakfast and 6 units with evening meal.   metoprolol  succinate 25 MG 24 hr tablet Commonly known as:  TOPROL-XL Take 1 tablet (25 mg total) by mouth daily. Take with or immediately following a meal.   MULTIVITAMIN ADULT PO Take 1 tablet by mouth daily with breakfast.   pantoprazole 40 MG tablet Commonly known as:  PROTONIX Take 1 tablet (40 mg total) by mouth daily.   warfarin 2.5 MG tablet Commonly known as:  COUMADIN Take as directed by the anticoagulation clinic. If you are unsure how to take this medication,  talk to your nurse or doctor. Original instructions:  TAKE AS DIRECTED BY  COUMADIN  CLINIC        Objective:   Physical Exam There were no vitals taken for this visit. This is a virtual video visit.  Alert oriented x3, no apparent distress    Assessment     Assessment   DM  + retinopathy, Dr [name] HTN Hyperlipidemia anxiety ativan GERD Elevated creatinine(started after admission 05-2015, had IV contrast) Anemia, normal iron 05-2015, never had a colonoscopy CV ---NSTEMI 05-2015, cath, angiplasty ---Echo, EF 40 % (05-2015) ---Atrial fibrillation, SVT -- dx 2007, cath 2007 mild CAD. 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), sz episode 08-2015 (?) - HAs Blindness, left eye Right eye weakness after cataract surgery Osteopenia per DEXA 2004, DEXA 2009 normal, DEXA 2015 @ [facility] -1.8 H/o lung bx 2008 benign  05-2015- admitted [place] sepsis  08-2015 admitted  B tibular Fx, no surgery, SNF 12-2015:   admitted, hip FX , surgery 01-2016: admitted, hip surgery infex   05-2016: Admitted, fall, pelvic fracture, d/c to home 10-02-16: admitted  UTI, nonketotic hyperglycemic syndrome  PLAN: DM: Per endocrinology HTN: Currently on Lasix, metoprolol.  Due for CMP, will send a message to the Coumadin clinic asking for a CMP  to be drawn at the time of her Coumadin check. Ambulatory BPs "varies", could not give me readings, recommend that her goal is to stay between 120 and 130. CAD, atrial fibrillation: Last visit with cardiology 04-2018, she was noted to be stable, on Coumadin and Plavix, they were thinking about switch from Plavix to aspirin. On chart review, aspirin is on her list but she only take it as needed for occasional aches or pain. Left wrist pain: The patient also reported 2 months history of left wrist pain, no injury, slightly TTP, she has a "bump" at the dorsal aspect of the left wrist.  She probably has a ganglion cyst, will reassess at the next face-to-face visit. RTC September 07, 2018, that is the day I will see her husband, patient will call and make an appointment.   I discussed the assessment and treatment plan with the patient. The patient was provided an opportunity to ask questions and all were answered. The patient agreed with the plan and demonstrated an understanding of the instructions.   The patient was advised to call back or seek an in-person evaluation if the symptoms worsen or if the condition fails to improve as anticipated.

## 2018-06-16 NOTE — Telephone Encounter (Signed)
Will have drawn at Lab on day of next INR check. Order entered and printed for pt.

## 2018-06-16 NOTE — Telephone Encounter (Signed)
-----   Message from Colon Branch, MD sent at 06/16/2018 11:26 AM EDT ----- Regarding: CMP Georgina Peer, I know my patient is going to have a INR check with you soon, could you please check a CMP and send it to me, dx is hypertension.I appreciate your help! JP

## 2018-06-17 NOTE — Assessment & Plan Note (Signed)
DM: Per endocrinology HTN: Currently on Lasix, metoprolol.  Due for CMP, will send a message to the Coumadin clinic asking for a CMP to be drawn at the time of her Coumadin check. Ambulatory BPs "varies", could not give me readings, recommend that her goal is to stay between 120 and 130. CAD, atrial fibrillation: Last visit with cardiology 04-2018, she was noted to be stable, on Coumadin and Plavix, they were thinking about switch from Plavix to aspirin. On chart review, aspirin is on her list but she only take it as needed for occasional aches or pain. Left wrist pain: The patient also reported 2 months history of left wrist pain, no injury, slightly TTP, she has a "bump" at the dorsal aspect of the left wrist.  She probably has a ganglion cyst, will reassess at the next face-to-face visit. RTC September 07, 2018, that is the day I will see her husband, patient will call and make an appointment.

## 2018-06-29 ENCOUNTER — Telehealth: Payer: Self-pay

## 2018-06-29 NOTE — Telephone Encounter (Signed)

## 2018-06-30 ENCOUNTER — Other Ambulatory Visit: Payer: Self-pay

## 2018-06-30 ENCOUNTER — Ambulatory Visit (INDEPENDENT_AMBULATORY_CARE_PROVIDER_SITE_OTHER): Payer: Medicare Other | Admitting: *Deleted

## 2018-06-30 DIAGNOSIS — I480 Paroxysmal atrial fibrillation: Secondary | ICD-10-CM | POA: Diagnosis not present

## 2018-06-30 DIAGNOSIS — I482 Chronic atrial fibrillation, unspecified: Secondary | ICD-10-CM | POA: Diagnosis not present

## 2018-06-30 DIAGNOSIS — Z5181 Encounter for therapeutic drug level monitoring: Secondary | ICD-10-CM | POA: Diagnosis not present

## 2018-06-30 LAB — POCT INR: INR: 2.3 (ref 2.0–3.0)

## 2018-07-01 LAB — COMPREHENSIVE METABOLIC PANEL
ALT: 16 IU/L (ref 0–32)
AST: 26 IU/L (ref 0–40)
Albumin/Globulin Ratio: 1.4 (ref 1.2–2.2)
Albumin: 4 g/dL (ref 3.7–4.7)
Alkaline Phosphatase: 107 IU/L (ref 39–117)
BUN/Creatinine Ratio: 16 (ref 12–28)
BUN: 24 mg/dL (ref 8–27)
Bilirubin Total: 0.5 mg/dL (ref 0.0–1.2)
CO2: 25 mmol/L (ref 20–29)
Calcium: 9.4 mg/dL (ref 8.7–10.3)
Chloride: 102 mmol/L (ref 96–106)
Creatinine, Ser: 1.48 mg/dL — ABNORMAL HIGH (ref 0.57–1.00)
GFR calc Af Amer: 39 mL/min/{1.73_m2} — ABNORMAL LOW (ref >59)
GFR calc non Af Amer: 34 mL/min/{1.73_m2} — ABNORMAL LOW (ref >59)
Globulin, Total: 2.9 g/dL (ref 1.5–4.5)
Glucose: 225 mg/dL — ABNORMAL HIGH (ref 65–99)
Potassium: 4.6 mmol/L (ref 3.5–5.2)
Sodium: 147 mmol/L — ABNORMAL HIGH (ref 134–144)
Total Protein: 6.9 g/dL (ref 6.0–8.5)

## 2018-07-01 NOTE — Addendum Note (Signed)
Addended byDamita Dunnings D on: 07/01/2018 12:46 PM   Modules accepted: Orders

## 2018-07-12 ENCOUNTER — Ambulatory Visit (INDEPENDENT_AMBULATORY_CARE_PROVIDER_SITE_OTHER): Payer: Medicare Other | Admitting: Endocrinology

## 2018-07-12 ENCOUNTER — Encounter: Payer: Self-pay | Admitting: Endocrinology

## 2018-07-12 ENCOUNTER — Other Ambulatory Visit: Payer: Self-pay

## 2018-07-12 VITALS — Ht 66.0 in

## 2018-07-12 DIAGNOSIS — Z794 Long term (current) use of insulin: Secondary | ICD-10-CM | POA: Diagnosis not present

## 2018-07-12 DIAGNOSIS — E11319 Type 2 diabetes mellitus with unspecified diabetic retinopathy without macular edema: Secondary | ICD-10-CM

## 2018-07-12 DIAGNOSIS — E1159 Type 2 diabetes mellitus with other circulatory complications: Secondary | ICD-10-CM

## 2018-07-12 DIAGNOSIS — I2510 Atherosclerotic heart disease of native coronary artery without angina pectoris: Secondary | ICD-10-CM

## 2018-07-12 DIAGNOSIS — Z9861 Coronary angioplasty status: Secondary | ICD-10-CM

## 2018-07-12 LAB — POCT GLYCOSYLATED HEMOGLOBIN (HGB A1C): Hemoglobin A1C: 8 % — AB (ref 4.0–5.6)

## 2018-07-12 MED ORDER — INSULIN NPH ISOPHANE & REGULAR (70-30) 100 UNIT/ML ~~LOC~~ SUSP: SUBCUTANEOUS | 11 refills | Status: DC

## 2018-07-12 NOTE — Patient Instructions (Addendum)
Please change the insulin to 31 units with breakfast, and 4 units with supper.  On this type of insulin schedule, you should eat meals on a regular schedule.  If a meal is missed or significantly delayed, your blood sugar could go low.  Please come back for a follow-up appointment in 4 months.   check your blood sugar twice a day.  vary the time of day when you check, between before the 3 meals, and at bedtime.  also check if you have symptoms of your blood sugar being too high or too low.  please keep a record of the readings and bring it to your next appointment here.  please call us sooner if your blood sugar goes below 70, or if you have a lot of readings over 200.

## 2018-07-12 NOTE — Progress Notes (Signed)
Subjective:    Patient ID: [name] [name], female    DOB: Apr 18, 1940, 78 y.o.   MRN: [id number]  HPI Pt returns for f/u of diabetes mellitus: DM type: Insulin-requiring type 2 Dx'ed: 0370 Complications: polyneuropathy, CAD, renal insuff, and retinopathy.  Therapy: insulin since soon after dx.  GDM: never.  DKA: never.  Severe hypoglycemia: last episode was in mid-2018.  Pancreatitis: never.   Other: she is on BID premixed insulin, due to noncompliance with multiple daily injections.  She takes human insulin, due to cost.  Interval history: no cbg record, but states cbg's vary from 84-300.  It is in general higher as the day goes on.  pt states she feels well in general. Past Medical History:  Diagnosis Date  . Atrial fibrillation ([place])    SVT dx 2007, cath 2007 mild CAD, then had an cardioversion, ablation; still on coumadin , has occ palpitation, EKG 03-2010 NSR  . Blindness of left eye   . CKD (chronic kidney disease), stage III ([place]) 06/16/2016   [name] 06/17/2016  . DIABETES MELLITUS, TYPE II 03/27/2006   dr [name]  . Eye muscle weakness    Right eye weakness after cataract surgery  . GERD (gastroesophageal reflux disease) 10/05/2011  . 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 HYPERLIPIDEMIA 03/27/2006  . HYPERTENSION 03/27/2006  . Intertrochanteric fracture of right hip ([place]) 07/13/2012  . 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 Osteopenia 2004   Dexa 2004 showed Osteopenia, DEXA 03/2007 normal  . Osteopenia   . Other chronic cystitis with hematuria   . Pelvic fracture ([place]) 06/16/2016   S/P fall  . Recurrent urinary tract infection    Seeing Urology  . 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 Seizures ([place])   . 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[place]) 4/88/8916    Past Surgical History:  Procedure Laterality Date  . ANKLE FRACTURE SURGERY Bilateral 08/2015   "right one was in 2 places"  . CARDIAC CATHETERIZATION N/A 05/17/2015   Procedure: Left Heart Cath and Coronary Angiography;  Surgeon: Jettie Booze, MD;  Location: [facility];  Service: Cardiovascular;  Laterality: N/A;  . CARDIAC CATHETERIZATION N/A 05/17/2015   Procedure: Coronary Balloon Angioplasty;  Surgeon: Jettie Booze, MD;  Location: [facility];  Service: Cardiovascular;  Laterality: N/A;  . FEMUR IM NAIL Right 07/15/2012   Procedure: INTRAMEDULLARY (IM) NAIL HIP;  Surgeon: Johnny Bridge, MD;  Location: [facility];  Service: Orthopedics;  Laterality: Right;  . FEMUR IM NAIL Left 12/02/2015   Procedure: INTRAMEDULLARY (IM) NAIL FEMORAL;  Surgeon: [name], MD;  Location: [facility];  Service: Orthopedics;  Laterality: Left;  . FRACTURE SURGERY    . INCISION AND DRAINAGE HIP Left 01/03/2016   Procedure: IRRIGATION AND DEBRIDEMENT HIP;  Surgeon: [name], MD;  Location: [facility];  Service: Orthopedics;  Laterality: Left;  . SHOULDER SURGERY Bilateral    "don't remember which side"    Social History   Socioeconomic History  . Marital status: Married    Spouse name: Jenny Reichmann  . Number of children: 1  . Years of education: College  . Highest education level: Not on file  Occupational History  . Occupation: retired     Fish farm manager: RETIRED  Social Needs  . Financial resource strain: Not on file  . Food insecurity:    Worry: Not on file    Inability: Not on file  .  Transportation needs:    Medical: Not on file    Non-medical: Not on file  Tobacco Use  . Smoking status: Former Smoker    Types: Cigarettes    Last attempt to quit: 03/02/1978    Years since quitting: 40.3  . Smokeless tobacco: Never Used  Substance and Sexual Activity  . Alcohol use: No    Alcohol/week: 0.0 standard drinks  . Drug use: No  . Sexual activity: Not Currently  Lifestyle  . Physical activity:    Days per week: Not on file    Minutes per session: Not on file  . Stress: Not on file  Relationships  . Social connections:    Talks on phone: Not on file     Gets together: Not on file    Attends religious service: Not on file    Active member of club or organization: Not on file    Attends meetings of clubs or organizations: Not on file    Relationship status: Not on file  . Intimate partner violence:    Fear of current or ex partner: Not on file    Emotionally abused: Not on file    Physically abused: Not on file    Forced sexual activity: Not on file  Other Topics Concern  . Not on file  Social History Narrative   Patient lives at home spouse. Uses a walker consistently.   Moved from [place] 2004   1 son, problems w/ drugs, passed away 2016/03/09-- OD        Current Outpatient Medications on File Prior to Visit  Medication Sig Dispense Refill  . aspirin EC 81 MG tablet Take 1 tablet (81 mg total) by mouth daily. 30 tablet 3  . atorvastatin (LIPITOR) 80 MG tablet Take 1 tablet (80 mg total) by mouth daily. 90 tablet 1  . calcium-vitamin D (OSCAL WITH D) 500-200 MG-UNIT per tablet Take 1 tablet by mouth daily.    [name] escitalopram (LEXAPRO) 10 MG tablet TAKE 2 TABLETS BY MOUTH ONCE DAILY 180 tablet 1  . furosemide (LASIX) 40 MG tablet Take 1 tablet (40 mg total) by mouth 2 (two) times daily as needed for fluid or edema. 180 tablet 1  . metoprolol succinate (TOPROL-XL) 25 MG 24 hr tablet Take 1 tablet (25 mg total) by mouth daily. Take with or immediately following a meal. 90 tablet 1  . Multiple Vitamins-Minerals (MULTIVITAMIN ADULT PO) Take 1 tablet by mouth daily with breakfast.     . pantoprazole (PROTONIX) 40 MG tablet Take 1 tablet (40 mg total) by mouth daily. 90 tablet 3  . warfarin (COUMADIN) 2.5 MG tablet TAKE AS DIRECTED BY  COUMADIN  CLINIC 120 tablet 1   No current facility-administered medications on file prior to visit.     Allergies  Allergen Reactions  . Procaine Hcl Anaphylaxis  . Amoxicillin Itching    Family History  Problem Relation Age of Onset  . Diabetes Father   . Heart attack Father 95  . Diabetes Sister   .  Drug abuse Son   . Cancer Neg Hx        no hx of colon or breast cancer    Ht 5\' 6"  (1.676 m)   BMI 30.67 kg/m    Review of Systems She denies hypoglycemia    Objective:   Physical Exam VITAL SIGNS:  See vs page GENERAL: no distress Pulses: dorsalis pedis intact bilat.   MSK: no deformity of the feet CV: no leg edema  Skin:  no ulcer on the feet.  normal color and temp on the feet. Neuro: sensation is intact to touch on the feet Ext: There is bilateral onychomycosis of the toenails.     Lab Results  Component Value Date   HGBA1C 8.0 (A) 07/12/2018   Lab Results  Component Value Date   CREATININE 1.48 (H) 06/30/2018   BUN 24 06/30/2018   NA 147 (H) 06/30/2018   K 4.6 06/30/2018   CL 102 06/30/2018   CO2 25 06/30/2018       Assessment & Plan:  Insulin-requiring type 2 DM, with DR: worse Renal insuff: in this setting, she may not need a PM insulin dose, but we'll take this in stages.    Patient Instructions  Please change the insulin to 31 units with breakfast, and 4 units with supper.  On this type of insulin schedule, you should eat meals on a regular schedule.  If a meal is missed or significantly delayed, your blood sugar could go low.  Please come back for a follow-up appointment in 4 months.   check your blood sugar twice a day.  vary the time of day when you check, between before the 3 meals, and at bedtime.  also check if you have symptoms of your blood sugar being too high or too low.  please keep a record of the readings and bring it to your next appointment here.  please call us sooner if your blood sugar goes below 70, or if you have a lot of readings over 200.

## 2018-07-15 ENCOUNTER — Emergency Department (HOSPITAL_COMMUNITY): Payer: Medicare Other

## 2018-07-15 ENCOUNTER — Other Ambulatory Visit: Payer: Self-pay

## 2018-07-15 ENCOUNTER — Emergency Department (HOSPITAL_COMMUNITY)
Admission: EM | Admit: 2018-07-15 | Discharge: 2018-07-15 | Disposition: A | Discharge status: Home / Self Care | Payer: Medicare Other | Attending: Emergency Medicine | Admitting: Emergency Medicine

## 2018-07-15 ENCOUNTER — Encounter (HOSPITAL_COMMUNITY): Payer: Self-pay | Admitting: Emergency Medicine

## 2018-07-15 DIAGNOSIS — Z79899 Other long term (current) drug therapy: Secondary | ICD-10-CM | POA: Diagnosis not present

## 2018-07-15 DIAGNOSIS — M5431 Sciatica, right side: Secondary | ICD-10-CM

## 2018-07-15 DIAGNOSIS — I4891 Unspecified atrial fibrillation: Secondary | ICD-10-CM | POA: Diagnosis not present

## 2018-07-15 DIAGNOSIS — I130 Hypertensive heart and chronic kidney disease with heart failure and stage 1 through stage 4 chronic kidney disease, or unspecified chronic kidney disease: Secondary | ICD-10-CM | POA: Diagnosis not present

## 2018-07-15 DIAGNOSIS — Z7982 Long term (current) use of aspirin: Secondary | ICD-10-CM | POA: Insufficient documentation

## 2018-07-15 DIAGNOSIS — E1122 Type 2 diabetes mellitus with diabetic chronic kidney disease: Secondary | ICD-10-CM | POA: Insufficient documentation

## 2018-07-15 DIAGNOSIS — M5441 Lumbago with sciatica, right side: Secondary | ICD-10-CM | POA: Insufficient documentation

## 2018-07-15 DIAGNOSIS — I5022 Chronic systolic (congestive) heart failure: Secondary | ICD-10-CM | POA: Insufficient documentation

## 2018-07-15 DIAGNOSIS — Z794 Long term (current) use of insulin: Secondary | ICD-10-CM | POA: Insufficient documentation

## 2018-07-15 DIAGNOSIS — I2510 Atherosclerotic heart disease of native coronary artery without angina pectoris: Secondary | ICD-10-CM | POA: Diagnosis not present

## 2018-07-15 DIAGNOSIS — M545 Low back pain: Secondary | ICD-10-CM | POA: Diagnosis present

## 2018-07-15 DIAGNOSIS — N183 Chronic kidney disease, stage 3 (moderate): Secondary | ICD-10-CM | POA: Insufficient documentation

## 2018-07-15 LAB — URINALYSIS, ROUTINE W REFLEX MICROSCOPIC
Bilirubin Urine: NEGATIVE
Glucose, UA: NEGATIVE mg/dL
Hgb urine dipstick: NEGATIVE
Ketones, ur: NEGATIVE mg/dL
Nitrite: NEGATIVE
Protein, ur: NEGATIVE mg/dL
Specific Gravity, Urine: 1.006 (ref 1.005–1.030)
pH: 7 (ref 5.0–8.0)

## 2018-07-15 LAB — CBC WITH DIFFERENTIAL/PLATELET
Abs Immature Granulocytes: 0.02 10*3/uL (ref 0.00–0.07)
Basophils Absolute: 0.1 10*3/uL (ref 0.0–0.1)
Basophils Relative: 1 %
Eosinophils Absolute: 0.2 10*3/uL (ref 0.0–0.5)
Eosinophils Relative: 2 %
HCT: 39.1 % (ref 36.0–46.0)
Hemoglobin: 12.7 g/dL (ref 12.0–15.0)
Immature Granulocytes: 0 %
Lymphocytes Relative: 31 %
Lymphs Abs: 3.2 10*3/uL (ref 0.7–4.0)
MCH: 30.2 pg (ref 26.0–34.0)
MCHC: 32.5 g/dL (ref 30.0–36.0)
MCV: 93.1 fL (ref 80.0–100.0)
Monocytes Absolute: 1 10*3/uL (ref 0.1–1.0)
Monocytes Relative: 10 %
Neutro Abs: 5.9 10*3/uL (ref 1.7–7.7)
Neutrophils Relative %: 56 %
Platelets: 396 10*3/uL (ref 150–400)
RBC: 4.2 MIL/uL (ref 3.87–5.11)
RDW: 13.3 % (ref 11.5–15.5)
WBC: 10.4 10*3/uL (ref 4.0–10.5)
nRBC: 0 % (ref 0.0–0.2)

## 2018-07-15 LAB — BASIC METABOLIC PANEL
Anion gap: 10 (ref 5–15)
BUN: 21 mg/dL (ref 8–23)
CO2: 28 mmol/L (ref 22–32)
Calcium: 9.8 mg/dL (ref 8.9–10.3)
Chloride: 106 mmol/L (ref 98–111)
Creatinine, Ser: 1.66 mg/dL — ABNORMAL HIGH (ref 0.44–1.00)
GFR calc Af Amer: 34 mL/min — ABNORMAL LOW (ref >60)
GFR calc non Af Amer: 30 mL/min — ABNORMAL LOW (ref >60)
Glucose, Bld: 49 mg/dL — ABNORMAL LOW (ref 70–99)
Potassium: 3.4 mmol/L — ABNORMAL LOW (ref 3.5–5.1)
Sodium: 144 mmol/L (ref 135–145)

## 2018-07-15 LAB — PROTIME-INR
INR: 1.9 — ABNORMAL HIGH (ref 0.8–1.2)
Prothrombin Time: 21.7 seconds — ABNORMAL HIGH (ref 11.4–15.2)

## 2018-07-15 MED ORDER — OXYCODONE-ACETAMINOPHEN 5-325 MG PO TABS: 1.0000 | ORAL_TABLET | Freq: Four times a day (QID) | ORAL | 0 refills | Status: DC | PRN

## 2018-07-15 MED ORDER — CEPHALEXIN 500 MG PO CAPS: 500.0000 mg | ORAL_CAPSULE | Freq: Three times a day (TID) | ORAL | 0 refills | Status: DC

## 2018-07-15 MED ORDER — OXYCODONE-ACETAMINOPHEN 5-325 MG PO TABS
1.0000 | ORAL_TABLET | Freq: Once | ORAL | Status: AC
  Administered 2018-07-15: 14:00:00 1 via ORAL
  Filled 2018-07-15: qty 1

## 2018-07-15 MED ORDER — DOCUSATE SODIUM 100 MG PO CAPS: 100.0000 mg | ORAL_CAPSULE | Freq: Every day | ORAL | 0 refills | Status: AC

## 2018-07-15 MED ORDER — OXYCODONE-ACETAMINOPHEN 5-325 MG PO TABS
1.0000 | ORAL_TABLET | Freq: Once | ORAL | Status: AC
  Administered 2018-07-15: 1 via ORAL
  Filled 2018-07-15: qty 1

## 2018-07-15 MED ORDER — DEXAMETHASONE SODIUM PHOSPHATE 10 MG/ML IJ SOLN
10.0000 mg | Freq: Once | INTRAMUSCULAR | Status: AC
  Administered 2018-07-15: 10 mg via INTRAVENOUS
  Filled 2018-07-15: qty 1

## 2018-07-15 NOTE — ED Notes (Signed)
Patient provided orange juice and meal.

## 2018-07-15 NOTE — Discharge Instructions (Addendum)
Try to limit the amount of time spent sitting directly on your buttocks/right hip area  Use a pillow to help prop the area up  Take pain medications as prescribed  Follow-up with Dr. Larose Kells in 24-48 hours

## 2018-07-15 NOTE — ED Notes (Signed)
Patient updating husband via cell phone.

## 2018-07-15 NOTE — ED Provider Notes (Signed)
[facility] EMERGENCY DEPARTMENT Provider Note   CSN: [id number] Arrival date & time: 07/15/18  1246    History   Chief Complaint Chief Complaint  Patient presents with   Back Pain    HPI [name] [name] is a 78 y.o. female.     HPI   [name] with PMHx as below here with back pain. Pt states that she awoke today with severe, reproducible, right posterolateral sacral/buttocks pain. The pain is sharp, worse w/ any movement of her leg or back, but resolves w/ rest. Denies any anterior abd pain, dysuria, hematuria, frequency. No loss of bowel or bladder function. She does admit to sitting more than usual lately. No radiation to the distal legs. Denies any specific injuries. H/o significant arthritis and b/l hip surgeries. She was well prior to waking up today. Denies new bedding/sleep position. No recent falls. No alleviating factors other than rest.  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   HYPERLIPIDEMIA 03/27/2006   HYPERTENSION 03/27/2006   Intertrochanteric fracture of right hip ([place]) 07/13/2012   LUNG NODULE 09/01/2006   Excision, Bx Benighn   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"   Osteopenia 2004   Dexa 2004 showed Osteopenia, DEXA 03/2007 normal   Osteopenia    Other chronic cystitis with hematuria    Pelvic fracture ([place]) 06/16/2016   S/P fall   Recurrent urinary tract infection    Seeing Urology   RETINOPATHY, 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   Seizures ([place])    Systolic  CHF ([place]) 9/38/1829    Patient Active Problem List   Diagnosis Date Noted   Cardiomyopathy ([place]) 04/07/2018   Hyperglycemia 10/02/2016   Foot ulcer ([place]) 07/24/2016   Gram-negative infection    CKD (chronic kidney disease) stage 3, GFR 30-59 ml/min ([facility])    Postoperative anemia due to acute blood loss 01/04/2016   Left hip postoperative wound infection 01/02/2016   CAD S/P percutaneous coronary angioplasty 93/71/6967   Acute systolic CHF (congestive heart failure) ([place]) 05/20/2015   Insulin dependent diabetes mellitus with complications ([place]) 89/38/1017   Acute kidney injury superimposed on chronic kidney disease ([place]) 05/11/2015   PCP NOTES >>>>>>>>>>>>>>>> 12/28/2014   Memory loss 05/17/2014   Anxiety and depression, PCP notes  11/24/2013   Severe obesity (BMI >= 40) ([place]) 07/14/2013   Encounter for therapeutic drug monitoring 06/02/2013   History of encephalitis 11/18/2012   Intertrochanteric fracture of right hip ([place]) 07/13/2012   GERD (gastroesophageal reflux disease) 10/05/2011   Seizure disorder ([place]) 05/24/2011   Annual physical exam >>>>>>>>>>>>>>>>>>>>>>>>>>>>>>>.. 02/10/2011   Atrial fibrillation ([place])    LUNG NODULE 09/01/2006   Hyperlipidemia 03/27/2006   Hereditary and idiopathic peripheral neuropathy 03/27/2006   RETINOPATHY, 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   Essential hypertension 03/27/2006   Osteopenia 03/27/2006    Past Surgical History:  Procedure Laterality Date   ANKLE FRACTURE SURGERY Bilateral 08/2015   "right one was in 2 places"   CARDIAC CATHETERIZATION  N/A 05/17/2015   Procedure: Left Heart Cath and Coronary Angiography;  Surgeon: Jettie Booze, MD;  Location: [facility];  Service: Cardiovascular;  Laterality: N/A;   CARDIAC CATHETERIZATION N/A 05/17/2015   Procedure: Coronary Balloon Angioplasty;  Surgeon: Jettie Booze, MD;  Location: [facility];  Service: Cardiovascular;  Laterality: N/A;    FEMUR IM NAIL Right 07/15/2012   Procedure: INTRAMEDULLARY (IM) NAIL HIP;  Surgeon: Johnny Bridge, MD;  Location: [facility];  Service: Orthopedics;  Laterality: Right;   FEMUR IM NAIL Left 12/02/2015   Procedure: INTRAMEDULLARY (IM) NAIL FEMORAL;  Surgeon: [name], MD;  Location: [facility];  Service: Orthopedics;  Laterality: Left;   FRACTURE SURGERY     INCISION AND DRAINAGE HIP Left 01/03/2016   Procedure: IRRIGATION AND DEBRIDEMENT HIP;  Surgeon: [name], MD;  Location: [facility];  Service: Orthopedics;  Laterality: Left;   SHOULDER SURGERY Bilateral    "don't remember which side"     OB History   No obstetric history on file.      Home Medications    Prior to Admission medications   Medication Sig Start Date End Date Taking? Authorizing Provider  aspirin EC 81 MG tablet Take 1 tablet (81 mg total) by mouth daily. 04/12/18  Yes Kilroy, Luke K, PA-C  atorvastatin (LIPITOR) 80 MG tablet Take 1 tablet (80 mg total) by mouth daily. 01/21/18  Yes [name], Alda Berthold, MD  calcium-vitamin D (OSCAL WITH D) 500-200 MG-UNIT per tablet Take 1 tablet by mouth daily.   Yes [provider]  escitalopram (LEXAPRO) 10 MG tablet TAKE 2 TABLETS BY MOUTH ONCE DAILY Patient taking differently: Take 10 mg by mouth daily.  03/25/17  Yes Copland, Gay Filler, MD  furosemide (LASIX) 40 MG tablet Take 1 tablet (40 mg total) by mouth 2 (two) times daily as needed for fluid or edema. 07/29/17  Yes [name], Alda Berthold, MD  insulin NPH-regular Human (NOVOLIN 70/30 RELION) (70-30) 100 UNIT/ML injection 31 units with breakfast and 4 units with evening meal. Patient taking differently: Inject 4-31 Units into the skin 2 (two) times daily with a meal. 31 units with breakfast and 4 units with evening meal. 07/12/18  Yes Renato Shin, MD  metoprolol succinate (TOPROL-XL) 25 MG 24 hr tablet Take 1 tablet (25 mg total) by mouth daily. Take with or immediately following a meal. 01/21/18  Yes [name], Alda Berthold, MD  Multiple  Vitamins-Minerals (MULTIVITAMIN ADULT PO) Take 1 tablet by mouth daily with breakfast.    Yes [provider]  pantoprazole (PROTONIX) 40 MG tablet Take 1 tablet (40 mg total) by mouth daily. 10/08/16  Yes [name], Alda Berthold, MD  warfarin (COUMADIN) 2.5 MG tablet TAKE AS DIRECTED BY  COUMADIN  CLINIC Patient taking differently: Take 2.5 mg by mouth daily.  01/03/18  Yes Evans Lance, MD    Family History Family History  Problem Relation Age of Onset   Diabetes Father    Heart attack Father 90   Diabetes Sister    Drug abuse Son    Cancer Neg Hx        no hx of colon or breast cancer    Social History Social History   Tobacco Use   Smoking status: Former Smoker    Types: Cigarettes    Last attempt to quit: 03/02/1978    Years since quitting: 40.3   Smokeless tobacco: Never Used  Substance Use Topics   Alcohol use: No  Alcohol/week: 0.0 standard drinks   Drug use: No     Allergies   Procaine hcl and Amoxicillin   Review of Systems Review of Systems  Constitutional: Negative for chills, fatigue and fever.  HENT: Negative for congestion and rhinorrhea.   Eyes: Negative for visual disturbance.  Respiratory: Negative for cough, shortness of breath and wheezing.   Cardiovascular: Negative for chest pain and leg swelling.  Gastrointestinal: Negative for abdominal pain, diarrhea, nausea and vomiting.  Genitourinary: Negative for dysuria and flank pain.  Musculoskeletal: Positive for arthralgias and back pain. Negative for neck pain and neck stiffness.  Skin: Negative for rash and wound.  Allergic/Immunologic: Negative for immunocompromised state.  Neurological: Negative for syncope, weakness and headaches.  All other systems reviewed and are negative.    Physical Exam Updated Vital Signs BP (!) 148/72 (BP Location: Right Arm)    Pulse (!) 58    Temp 98.8 F (37.1 C) (Oral)    Resp 14    Ht 5\' 6"  (1.676 m)    Wt 86.2 kg    SpO2 100%    BMI 30.67 kg/m    Physical Exam Vitals signs and nursing note reviewed.  Constitutional:      General: She is not in acute distress.    Appearance: She is well-developed.  HENT:     Head: Normocephalic and atraumatic.  Eyes:     Conjunctiva/sclera: Conjunctivae normal.  Neck:     Musculoskeletal: Neck supple.  Cardiovascular:     Rate and Rhythm: Normal rate and regular rhythm.     Heart sounds: Normal heart sounds. No murmur. No friction rub.  Pulmonary:     Effort: Pulmonary effort is normal. No respiratory distress.     Breath sounds: Normal breath sounds. No wheezing or rales.  Abdominal:     General: There is no distension.     Palpations: Abdomen is soft.     Tenderness: There is no abdominal tenderness. There is no guarding or rebound.  Musculoskeletal:       Back:  Skin:    General: Skin is warm.     Capillary Refill: Capillary refill takes less than 2 seconds.  Neurological:     Mental Status: She is alert and oriented to person, place, and time.     Motor: No abnormal muscle tone.      ED Treatments / Results  Labs (all labs ordered are listed, but only abnormal results are displayed) Labs Reviewed  URINALYSIS, ROUTINE W REFLEX MICROSCOPIC - Abnormal; Notable for the following components:      Result Value   APPearance HAZY (*)    Leukocytes,Ua LARGE (*)    Bacteria, UA MANY (*)    All other components within normal limits  PROTIME-INR - Abnormal; Notable for the following components:   Prothrombin Time 21.7 (*)    INR 1.9 (*)    All other components within normal limits  BASIC METABOLIC PANEL - Abnormal; Notable for the following components:   Potassium 3.4 (*)    Glucose, Bld 49 (*)    Creatinine, Ser 1.66 (*)    GFR calc non Af Amer 30 (*)    GFR calc Af Amer 34 (*)    All other components within normal limits  URINE CULTURE  CBC WITH DIFFERENTIAL/PLATELET    EKG None  Radiology Ct Lumbar Spine Wo Contrast  Result Date: 07/15/2018 CLINICAL DATA:  Low  back and right hip pain. Previous sacral fracture on 06/15/2016. EXAM: CT LUMBAR  SPINE WITHOUT CONTRAST TECHNIQUE: Multidetector CT imaging of the lumbar spine was performed without intravenous contrast administration. Multiplanar CT image reconstructions were also generated. COMPARISON:  CT scan of the abdomen and pelvis dated 05/20/2015 and CT scan of the right hip dated 06/15/2016 FINDINGS: Segmentation: 5 lumbar type vertebrae. Alignment: There is a healed transverse fracture of the sacrum at the S1-2 level with a residual 6 mm anterolisthesis of S1 on S2. Vertebrae: There is a mild compression fracture of the inferior endplate of L1 which is new since the prior CT scan but does not appear acute. There is severe degenerative changes of the vertebral endplates at B6-3. There is severe left facet arthritis at L5-S1. Paraspinal and other soft tissues: Aortic atherosclerosis. Stable 16 mm cyst in the medial aspect of the mid left kidney. Disc levels: T11-12: Negative. T12-L1: Negative. L1-2: Old mild compression fracture of the inferior endplate of L1. No disc bulging or protrusion. Hypertrophy of the ligamentum flavum creates mild spinal stenosis. L2-3: Severe degenerative disc disease with a broad-based disc bulge with accompanying osteophytes. Slight hypertrophy of the ligamentum flavum and facet joints combine to create moderately severe spinal stenosis. L3-4: Small broad-based disc bulge with accompanying osteophytes asymmetric to the right combine with hypertrophy of the ligamentum flavum create severe spinal stenosis with compression of both lateral recesses. Moderate right foraminal stenosis. L4-5: Small broad-based soft disc protrusion. Hypertrophy of the ligamentum flavum and slight facet arthritis combine to create severe spinal stenosis as well as moderate right and severe left foraminal stenosis. L5-S1: Small broad-based disc bulge without neural impingement. Moderately severe left foraminal stenosis.  Severe left facet arthritis. IMPRESSION: 1. Severe spinal stenosis at L3-4 and L4-5. 2. Moderately severe spinal stenosis at L2-3. 3. Severe left foraminal stenosis at L4-5 and L5-S1. 4. Old healed fracture of the proximal sacrum with secondary residual anterolisthesis at S1-2. 5. Old mild compression fracture of the anterior aspect of the inferior endplate of L1. 6.  Aortic Atherosclerosis (ICD10-I70.0). Electronically Signed   By: Lorriane Shire M.D.   On: 07/15/2018 15:00   Dg Hip Unilat W Or Wo Pelvis 2-3 Views Right  Result Date: 07/15/2018 CLINICAL DATA:  Right hip pain EXAM: DG HIP (WITH OR WITHOUT PELVIS) 2-3V RIGHT COMPARISON:  06/15/2016 FINDINGS: Right hip screw and intramedullary rod from prior fracture fixation. Proximal right femur fracture appears healed. No acute fracture. Chronic fracture right inferior pubic ramus. Chronic fracture left intertrochanteric femur with compression screw and intramedullary rod which are in good position. IMPRESSION: Healed fractures of the proximal femur bilaterally. No acute fracture. Electronically Signed   By: Franchot Gallo M.D.   On: 07/15/2018 14:53    Procedures Procedures (including critical care time)  Medications Ordered in ED Medications  oxyCODONE-acetaminophen (PERCOCET/ROXICET) 5-325 MG per tablet 1 tablet (1 tablet Oral Given 07/15/18 1332)  oxyCODONE-acetaminophen (PERCOCET/ROXICET) 5-325 MG per tablet 1 tablet (1 tablet Oral Given 07/15/18 1617)     Initial Impression / Assessment and Plan / ED Course  I have reviewed the triage vital signs and the nursing notes.  Pertinent labs & imaging results that were available during my care of the patient were reviewed by me and considered in my medical decision making (see chart for details).  Clinical Course as of Jul 14 1745  Fri Jul 15, 2018  1653 [name] with PMHx as above here with right paraspinal back pain. 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 No LLE  symptoms, no loss of  bowel or bladder function to suggest cauda equina or cord compressions. No abd TTP, no focal TTP to suggest appendicitis, diverticulitis. CT scan obtained and shows severe degen disease which I suspect is contirbuting. No fx, malalignment. No RP hematoma. No signs of hip fx. UA incidentally with + pyuria. No urinary sx, will cover to be safe but no CVAT or signs of pyelo. Will hold on nsaids 2/2 coumadin use and will give a single decadron dose here but hold on ongoing course 2/2 her DM (A1C 8). Pt updated and in agreement. Labs reassuring.    [CI]    Clinical Course User Index [CI] Duffy Bruce, MD        Final Clinical Impressions(s) / ED Diagnoses   Final diagnoses:  None    ED Discharge Orders    None       Duffy Bruce, MD 07/15/18 1747

## 2018-07-15 NOTE — ED Triage Notes (Signed)
Patient arrive via [facility] with complaints of right lower back pain and hip pain. Reports awakening from sleep in pain. Denies radiation of pain to extremities. Active ROM. Denies tingling or numbness. Denies dysuria.

## 2018-07-18 ENCOUNTER — Ambulatory Visit: Payer: Self-pay

## 2018-07-18 ENCOUNTER — Inpatient Hospital Stay (HOSPITAL_COMMUNITY)
Admission: EM | Admit: 2018-07-18 | Discharge: 2018-07-20 | DRG: 543 | Disposition: A | Discharge status: Home Health | Payer: Medicare Other | Attending: Family Medicine | Admitting: Family Medicine

## 2018-07-18 ENCOUNTER — Other Ambulatory Visit: Payer: Self-pay

## 2018-07-18 ENCOUNTER — Emergency Department (HOSPITAL_COMMUNITY): Payer: Medicare Other

## 2018-07-18 ENCOUNTER — Encounter (HOSPITAL_COMMUNITY): Payer: Self-pay

## 2018-07-18 ENCOUNTER — Ambulatory Visit (INDEPENDENT_AMBULATORY_CARE_PROVIDER_SITE_OTHER): Payer: Medicare Other | Admitting: Internal Medicine

## 2018-07-18 DIAGNOSIS — H5462 Unqualified visual loss, left eye, normal vision right eye: Secondary | ICD-10-CM | POA: Diagnosis present

## 2018-07-18 DIAGNOSIS — N390 Urinary tract infection, site not specified: Secondary | ICD-10-CM | POA: Diagnosis present

## 2018-07-18 DIAGNOSIS — Z1159 Encounter for screening for other viral diseases: Secondary | ICD-10-CM

## 2018-07-18 DIAGNOSIS — E1122 Type 2 diabetes mellitus with diabetic chronic kidney disease: Secondary | ICD-10-CM | POA: Diagnosis present

## 2018-07-18 DIAGNOSIS — E785 Hyperlipidemia, unspecified: Secondary | ICD-10-CM | POA: Diagnosis present

## 2018-07-18 DIAGNOSIS — S32019A Unspecified fracture of first lumbar vertebra, initial encounter for closed fracture: Secondary | ICD-10-CM | POA: Diagnosis present

## 2018-07-18 DIAGNOSIS — S32010A Wedge compression fracture of first lumbar vertebra, initial encounter for closed fracture: Secondary | ICD-10-CM | POA: Diagnosis present

## 2018-07-18 DIAGNOSIS — Z9861 Coronary angioplasty status: Secondary | ICD-10-CM

## 2018-07-18 DIAGNOSIS — M4856XA Collapsed vertebra, not elsewhere classified, lumbar region, initial encounter for fracture: Secondary | ICD-10-CM | POA: Diagnosis not present

## 2018-07-18 DIAGNOSIS — Z87891 Personal history of nicotine dependence: Secondary | ICD-10-CM

## 2018-07-18 DIAGNOSIS — I5022 Chronic systolic (congestive) heart failure: Secondary | ICD-10-CM | POA: Diagnosis present

## 2018-07-18 DIAGNOSIS — M545 Low back pain: Secondary | ICD-10-CM | POA: Diagnosis not present

## 2018-07-18 DIAGNOSIS — I130 Hypertensive heart and chronic kidney disease with heart failure and stage 1 through stage 4 chronic kidney disease, or unspecified chronic kidney disease: Secondary | ICD-10-CM | POA: Diagnosis present

## 2018-07-18 DIAGNOSIS — I2510 Atherosclerotic heart disease of native coronary artery without angina pectoris: Secondary | ICD-10-CM

## 2018-07-18 DIAGNOSIS — M48061 Spinal stenosis, lumbar region without neurogenic claudication: Secondary | ICD-10-CM

## 2018-07-18 DIAGNOSIS — N183 Chronic kidney disease, stage 3 unspecified: Secondary | ICD-10-CM | POA: Diagnosis present

## 2018-07-18 DIAGNOSIS — M47816 Spondylosis without myelopathy or radiculopathy, lumbar region: Secondary | ICD-10-CM | POA: Diagnosis present

## 2018-07-18 DIAGNOSIS — I4891 Unspecified atrial fibrillation: Secondary | ICD-10-CM | POA: Diagnosis present

## 2018-07-18 DIAGNOSIS — Z7901 Long term (current) use of anticoagulants: Secondary | ICD-10-CM

## 2018-07-18 DIAGNOSIS — M25551 Pain in right hip: Secondary | ICD-10-CM

## 2018-07-18 DIAGNOSIS — Z87440 Personal history of urinary (tract) infections: Secondary | ICD-10-CM

## 2018-07-18 DIAGNOSIS — Z833 Family history of diabetes mellitus: Secondary | ICD-10-CM

## 2018-07-18 DIAGNOSIS — B961 Klebsiella pneumoniae [K. pneumoniae] as the cause of diseases classified elsewhere: Secondary | ICD-10-CM | POA: Diagnosis present

## 2018-07-18 DIAGNOSIS — I480 Paroxysmal atrial fibrillation: Secondary | ICD-10-CM | POA: Diagnosis not present

## 2018-07-18 DIAGNOSIS — Z79899 Other long term (current) drug therapy: Secondary | ICD-10-CM

## 2018-07-18 DIAGNOSIS — G40909 Epilepsy, unspecified, not intractable, without status epilepticus: Secondary | ICD-10-CM

## 2018-07-18 DIAGNOSIS — I255 Ischemic cardiomyopathy: Secondary | ICD-10-CM | POA: Diagnosis present

## 2018-07-18 DIAGNOSIS — M5459 Other low back pain: Secondary | ICD-10-CM

## 2018-07-18 DIAGNOSIS — Z7982 Long term (current) use of aspirin: Secondary | ICD-10-CM

## 2018-07-18 DIAGNOSIS — Z955 Presence of coronary angioplasty implant and graft: Secondary | ICD-10-CM

## 2018-07-18 DIAGNOSIS — R7989 Other specified abnormal findings of blood chemistry: Secondary | ICD-10-CM

## 2018-07-18 DIAGNOSIS — Z794 Long term (current) use of insulin: Secondary | ICD-10-CM

## 2018-07-18 DIAGNOSIS — I10 Essential (primary) hypertension: Secondary | ICD-10-CM | POA: Diagnosis present

## 2018-07-18 LAB — CBC WITH DIFFERENTIAL/PLATELET
Abs Immature Granulocytes: 0.03 10*3/uL (ref 0.00–0.07)
Basophils Absolute: 0.1 10*3/uL (ref 0.0–0.1)
Basophils Relative: 1 %
Eosinophils Absolute: 0.3 10*3/uL (ref 0.0–0.5)
Eosinophils Relative: 3 %
HCT: 38.7 % (ref 36.0–46.0)
Hemoglobin: 12.7 g/dL (ref 12.0–15.0)
Immature Granulocytes: 0 %
Lymphocytes Relative: 27 %
Lymphs Abs: 2.8 10*3/uL (ref 0.7–4.0)
MCH: 30.8 pg (ref 26.0–34.0)
MCHC: 32.8 g/dL (ref 30.0–36.0)
MCV: 93.7 fL (ref 80.0–100.0)
Monocytes Absolute: 1.1 10*3/uL — ABNORMAL HIGH (ref 0.1–1.0)
Monocytes Relative: 11 %
Neutro Abs: 6 10*3/uL (ref 1.7–7.7)
Neutrophils Relative %: 58 %
Platelets: 419 10*3/uL — ABNORMAL HIGH (ref 150–400)
RBC: 4.13 MIL/uL (ref 3.87–5.11)
RDW: 13.3 % (ref 11.5–15.5)
WBC: 10.3 10*3/uL (ref 4.0–10.5)
nRBC: 0 % (ref 0.0–0.2)

## 2018-07-18 LAB — BASIC METABOLIC PANEL
Anion gap: 13 (ref 5–15)
BUN: 36 mg/dL — ABNORMAL HIGH (ref 8–23)
CO2: 25 mmol/L (ref 22–32)
Calcium: 9.1 mg/dL (ref 8.9–10.3)
Chloride: 100 mmol/L (ref 98–111)
Creatinine, Ser: 1.72 mg/dL — ABNORMAL HIGH (ref 0.44–1.00)
GFR calc Af Amer: 33 mL/min — ABNORMAL LOW (ref >60)
GFR calc non Af Amer: 28 mL/min — ABNORMAL LOW (ref >60)
Glucose, Bld: 297 mg/dL — ABNORMAL HIGH (ref 70–99)
Potassium: 3.8 mmol/L (ref 3.5–5.1)
Sodium: 138 mmol/L (ref 135–145)

## 2018-07-18 MED ORDER — CYCLOBENZAPRINE HCL 10 MG PO TABS
5.0000 mg | ORAL_TABLET | Freq: Once | ORAL | Status: AC
  Administered 2018-07-18: 5 mg via ORAL
  Filled 2018-07-18: qty 1

## 2018-07-18 MED ORDER — LIDOCAINE 5 % EX PTCH
1.0000 | MEDICATED_PATCH | CUTANEOUS | Status: DC
  Administered 2018-07-18: 1 via TRANSDERMAL
  Filled 2018-07-18 (×2): qty 1

## 2018-07-18 MED ORDER — HYDROMORPHONE HCL 1 MG/ML IJ SOLN
0.5000 mg | Freq: Once | INTRAMUSCULAR | Status: AC
  Administered 2018-07-18: 0.5 mg via INTRAVENOUS
  Filled 2018-07-18: qty 1

## 2018-07-18 MED ORDER — SODIUM CHLORIDE 0.9 % IV BOLUS
500.0000 mL | Freq: Once | INTRAVENOUS | Status: AC
  Administered 2018-07-18: 500 mL via INTRAVENOUS

## 2018-07-18 MED ORDER — HYDROCODONE-ACETAMINOPHEN 7.5-300 MG PO TABS: 1.0000 | ORAL_TABLET | Freq: Four times a day (QID) | ORAL | 0 refills | Status: DC | PRN

## 2018-07-18 NOTE — Progress Notes (Signed)
Subjective:    Patient ID: [name] [name], female    DOB: 1940-06-04, 78 y.o.   MRN: [id number]  DOS:  07/18/2018 Type of visit - description: Virtual Visit via Video Note  I connected with@ on 07/18/18 at 10:40 AM EDT by a video enabled telemedicine application and verified that I am speaking with the correct person using two identifiers.   THIS ENCOUNTER IS A VIRTUAL VISIT DUE TO COVID-19 - PATIENT WAS NOT SEEN IN THE OFFICE. PATIENT HAS CONSENTED TO VIRTUAL VISIT / TELEMEDICINE VISIT   Location of patient: home  Location of provider: office  I discussed the limitations of evaluation and management by telemedicine and the availability of in person appointments. The patient expressed understanding and agreed to proceed.  History of Present Illness: ER follow-up  The patient had an acute onset of right hip pain on 07/15/2018.  The pain is located at the posterior hip/buttock. No radiation. It is a steady Pain decreases when she lays down in bed, increase when she sits down, and becomes severe if she tries to walk.  Went to the ER 07/15/2018.  Records reviewed. Urinalysis showed leukocytes and bacteria Urine culture showed Klebsiella and Streptococcus viridans.  Susceptibilities pending.  She is on Keflex. X-rays of the hip show healed fracture of the proximal femur bilaterally, CT lumbar spine showing severe spinal stenosis  INR 1.9.  CBC normal.  BMP at baseline. Pain suspected to be due to sciatic with possibly piriformis syndrome  The patient is on Coumadin, CT show no retroperitoneal hematoma.  At this point, she is taking oxycodone as prescribed at the ER and the pain is about the same.   Review of Systems No fever chills No recent fall or injury No rash No abdominal pain No LUTS  Past Medical History:  Diagnosis Date  . Atrial fibrillation ([place])    SVT dx 2007, cath 2007 mild CAD, then had an cardioversion, ablation; still on coumadin , has occ palpitation, EKG  03-2010 NSR  . Blindness of left eye   . CKD (chronic kidney disease), stage III ([place]) 06/16/2016   [name] 06/17/2016  . DIABETES MELLITUS, TYPE II 03/27/2006   dr [name]  . Eye muscle weakness    Right eye weakness after cataract surgery  . GERD (gastroesophageal reflux disease) 10/05/2011  . 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 HYPERLIPIDEMIA 03/27/2006  . HYPERTENSION 03/27/2006  . Intertrochanteric fracture of right hip ([place]) 07/13/2012  . 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 Osteopenia 2004   Dexa 2004 showed Osteopenia, DEXA 03/2007 normal  . Osteopenia   . Other chronic cystitis with hematuria   . Pelvic fracture ([place]) 06/16/2016   S/P fall  . Recurrent urinary tract infection    Seeing Urology  . 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 Seizures ([place])   . 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[place] 06/01/270    Past Surgical History:  Procedure Laterality Date  . ANKLE FRACTURE SURGERY Bilateral 08/2015   "right one was in 2 places"  . CARDIAC CATHETERIZATION N/A 05/17/2015   Procedure: Left Heart Cath and Coronary Angiography;  Surgeon: Jettie Booze, MD;  Location: [facility];  Service: Cardiovascular;  Laterality: N/A;  . CARDIAC CATHETERIZATION N/A 05/17/2015   Procedure: Coronary Balloon Angioplasty;  Surgeon: Jettie Booze, MD;  Location: [facility];  Service: Cardiovascular;  Laterality: N/A;  . FEMUR IM NAIL Right 07/15/2012  Procedure: INTRAMEDULLARY (IM) NAIL HIP;  Surgeon: Johnny Bridge, MD;  Location: [facility];  Service: Orthopedics;  Laterality: Right;  . FEMUR IM NAIL Left 12/02/2015   Procedure: INTRAMEDULLARY (IM) NAIL FEMORAL;  Surgeon: [name], MD;  Location: [facility];  Service: Orthopedics;  Laterality: Left;  . FRACTURE SURGERY    . INCISION AND DRAINAGE HIP Left 01/03/2016   Procedure: IRRIGATION AND DEBRIDEMENT HIP;  Surgeon: [name], MD;  Location: [facility];  Service: Orthopedics;  Laterality:  Left;  . SHOULDER SURGERY Bilateral    "don't remember which side"    Social History   Socioeconomic History  . Marital status: Married    Spouse name: Jenny Reichmann  . Number of children: 1  . Years of education: College  . Highest education level: Not on file  Occupational History  . Occupation: retired     Fish farm manager: RETIRED  Social Needs  . Financial resource strain: Not on file  . Food insecurity:    Worry: Not on file    Inability: Not on file  . Transportation needs:    Medical: Not on file    Non-medical: Not on file  Tobacco Use  . Smoking status: Former Smoker    Types: Cigarettes    Last attempt to quit: 03/02/1978    Years since quitting: 40.4  . Smokeless tobacco: Never Used  Substance and Sexual Activity  . Alcohol use: No    Alcohol/week: 0.0 standard drinks  . Drug use: No  . Sexual activity: Not Currently  Lifestyle  . Physical activity:    Days per week: Not on file    Minutes per session: Not on file  . Stress: Not on file  Relationships  . Social connections:    Talks on phone: Not on file    Gets together: Not on file    Attends religious service: Not on file    Active member of club or organization: Not on file    Attends meetings of clubs or organizations: Not on file    Relationship status: Not on file  . Intimate partner violence:    Fear of current or ex partner: Not on file    Emotionally abused: Not on file    Physically abused: Not on file    Forced sexual activity: Not on file  Other Topics Concern  . Not on file  Social History Narrative   Patient lives at home spouse. Uses a walker consistently.   Moved from [place] 2004   1 son, problems w/ drugs, passed away 2016-03-11-- OD          Allergies as of 07/18/2018      Reactions   Procaine Hcl Anaphylaxis   Amoxicillin Itching   Did it involve swelling of the face/tongue/throat, SOB, or low BP? No Did it involve sudden or severe rash/hives, skin peeling, or any reaction on the inside of your  mouth or nose? No Did you need to seek medical attention at a hospital or doctor's office? No When did it last happen?unk If all above answers are "NO", may proceed with cephalosporin use.      Medication List       Accurate as of Jul 18, 2018 10:36 AM. If you have any questions, ask your nurse or doctor.        aspirin EC 81 MG tablet Take 1 tablet (81 mg total) by mouth daily.   atorvastatin 80 MG tablet Commonly known as:  LIPITOR Take 1  tablet (80 mg total) by mouth daily.   calcium-vitamin D 500-200 MG-UNIT tablet Commonly known as:  OSCAL WITH D Take 1 tablet by mouth daily.   cephALEXin 500 MG capsule Commonly known as:  KEFLEX Take 1 capsule (500 mg total) by mouth 3 (three) times daily for 5 days.   docusate sodium 100 MG capsule Commonly known as:  COLACE Take 1 capsule (100 mg total) by mouth daily for 7 days. While taking pain medications   escitalopram 10 MG tablet Commonly known as:  LEXAPRO TAKE 2 TABLETS BY MOUTH ONCE DAILY What changed:  how much to take   furosemide 40 MG tablet Commonly known as:  LASIX Take 1 tablet (40 mg total) by mouth 2 (two) times daily as needed for fluid or edema.   insulin NPH-regular Human (70-30) 100 UNIT/ML injection Commonly known as:  NovoLIN 70/30 ReliOn 31 units with breakfast and 4 units with evening meal. What changed:    how much to take  how to take this  when to take this   metoprolol succinate 25 MG 24 hr tablet Commonly known as:  TOPROL-XL Take 1 tablet (25 mg total) by mouth daily. Take with or immediately following a meal.   MULTIVITAMIN ADULT PO Take 1 tablet by mouth daily with breakfast.   oxyCODONE-acetaminophen 5-325 MG tablet Commonly known as:  PERCOCET/ROXICET Take 1-2 tablets by mouth every 6 (six) hours as needed for moderate pain or severe pain.   pantoprazole 40 MG tablet Commonly known as:  PROTONIX Take 1 tablet (40 mg total) by mouth daily.   warfarin 2.5 MG tablet  Commonly known as:  COUMADIN Take as directed by the anticoagulation clinic. If you are unsure how to take this medication, talk to your nurse or doctor. Original instructions:  TAKE AS DIRECTED BY  COUMADIN  CLINIC What changed:  See the new instructions.           Objective:   Physical Exam There were no vitals taken for this visit. This is a virtual video visit, the patient is alert oriented x3, mental status normal, she is laying down in bed, no distress    Assessment     Assessment   DM  + retinopathy, Dr [name] HTN Hyperlipidemia anxiety ativan GERD Elevated creatinine(started after admission 05-2015, had IV contrast) Anemia, normal iron 05-2015, never had a colonoscopy CV ---NSTEMI 05-2015, cath, angiplasty ---Echo, EF 40 % (05-2015) ---Atrial fibrillation, SVT -- dx 2007, cath 2007 mild CAD. 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 Pain seems to be mechanic although other etiologies are possible however much less likely (shingles, hematoma not seen on CT, etc).  Oxycodone is not working, does not recall if hydrocodone worked better Plan:  Needs further evaluation,  refer to orthopedic surgery  Stop oxycodone, try hydrocodone. UTI: Urine culture + for Klebsiella and strep viridans.  On cephalexin, susceptibilities pending.  Will wait for then and adjust antibiotic if needed (noting that the patient has no urinary symptoms) . Coumadin management: Per Coumadin clinic.    I discussed the assessment and treatment plan with the patient. The patient was provided an opportunity to ask questions and all were answered. The patient agreed with the plan and demonstrated an understanding of the instructions.   The patient was advised to call back or seek an in-person evaluation if the symptoms worsen or if the condition fails to improve as anticipated.

## 2018-07-18 NOTE — Assessment & Plan Note (Signed)
Acute pain, right hip/low distal right back: Work-up at the ER is negative, oxycodone is not controlling her pain. Pain seems to be mechanic although other etiologies are possible however much less likely (shingles, hematoma not seen on CT, etc).  Oxycodone is not working, does not recall if hydrocodone worked better Plan:  Needs further evaluation, refer to orthopedic surgery Stop oxycodone, try hydrocodone. UTI: Urine culture + for Klebsiella and strep viridans.  On cephalexin, susceptibilities pending.  Will wait for then and adjust antibiotic if needed (noting that the patient has no urinary symptoms) . Coumadin management: Per Coumadin clinic.

## 2018-07-18 NOTE — ED Notes (Addendum)
This nurse and RN Joellen [name] attempted to ambulate patient with a walker. Pt was able to stand up with assistance but states her pain was still too bad and unable to walk. Assisted patient back into bed and PA [facility] made aware.

## 2018-07-18 NOTE — ED Notes (Signed)
Gave pt tomato soup, crackers, and water.

## 2018-07-18 NOTE — ED Notes (Signed)
Had Nurse, adult.

## 2018-07-18 NOTE — Telephone Encounter (Signed)
Patient's husband called and says his wife is crying in the bed with hip pain and the Hydrocodone ordered by Dr. Larose Kells today is not working, she took it about 1 hour ago. He asks should he take her back to the emergency room, I advised since the office is closed and I don't have a way to speak to Dr. Larose Kells, then the ED would be the choice, it is his decision. The patient was placed on the phone and she says her hip is hurting and it's the worst pain she ever felt. I advised to go to the ED. The husband says he will call EMS.

## 2018-07-18 NOTE — ED Provider Notes (Signed)
[name] DEPT Provider Note   CSN: [id number] Arrival date & time: 07/18/18  1720    History   Chief Complaint No chief complaint on file.   HPI [name] [name] is a 78 y.o. female with history of atrial fibrillation, CKD, diabetes mellitus, GERD, HLD, HTN, CHF presents for evaluation of acute onset, progressively worsening right-sided low back pain and right hip pain for 4 days.  Reports that she awoke at around 6 AM on 07/15/2018 with severe right lower back pain radiating to the right hip.  Pain worsens with bending and palpation.  Denies bowel or bladder incontinence, saddle anesthesia, fevers, or history of IV drug use.  No radiation of pain distal to the right hip.  Reports that she has not been able to walk as a result of her pain.  No recent trauma.  History of bilateral femur/hip surgeries.  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  He was given a single dose of Decadron in the ED, has been taking percocet at home with no relief of her symptoms.  She also had a UA concerning for UTI and so was discharged on Keflex.  Urine culture grew Klebsiella pneumonia and strep viridans.  Typically ambulates with the aid of a walker at home.  She followed up with her PCP earlier today via telemedicine visit who recommended presentation to the ED for further work-up and management.     The history is provided by the patient.    Past Medical History:  Diagnosis Date  . Atrial fibrillation ([place])    SVT dx 2007, cath 2007 mild CAD, then had an cardioversion, ablation; still on coumadin , has occ palpitation, EKG 03-2010 NSR  . Blindness of left eye   . CKD (chronic kidney disease), stage III ([place]) 06/16/2016   [name] [name] 06/17/2016  . DIABETES MELLITUS, TYPE II 03/27/2006   dr [name] [name]  . Eye muscle weakness    Right eye weakness after cataract surgery  . GERD  (gastroesophageal reflux disease) 10/05/2011  . 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 HYPERLIPIDEMIA 03/27/2006  . HYPERTENSION 03/27/2006  . Intertrochanteric fracture of right hip ([place]) 07/13/2012  . 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 Osteopenia 2004   Dexa 2004 showed Osteopenia, DEXA 03/2007 normal  . Osteopenia   . Other chronic cystitis with hematuria   . Pelvic fracture ([place]) 06/16/2016   S/P fall  . Recurrent urinary tract infection    Seeing Urology  . 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 Seizures ([place])   . 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[place]) 0/76/1518    Patient Active Problem List   Diagnosis Date Noted  . Closed L1 vertebral fracture ([place]) 07/18/2018  . Cardiomyopathy ([place]) 04/07/2018  . Hyperglycemia 10/02/2016  . Foot ulcer ([place]) 07/24/2016  . Gram-negative infection   . CKD (chronic kidney disease) stage 3, GFR 30-59 ml/min (HCC)   . Postoperative anemia due to acute blood loss 01/04/2016  . Left hip postoperative wound infection 01/02/2016  . CAD S/P percutaneous coronary angioplasty 05/20/2015  . Acute systolic CHF (congestive heart failure) ([place]) 05/20/2015  . Insulin dependent diabetes mellitus with complications ([place]) 34/37/3578  . Acute kidney injury superimposed on chronic kidney disease ([place]) 05/11/2015  . PCP NOTES >>>>>>>>>>>>>>>> 12/28/2014  . Memory loss 05/17/2014  . Anxiety and  depression, PCP notes  11/24/2013  . Severe obesity (BMI >= 40) ([place]) 07/14/2013  . Encounter for therapeutic drug monitoring 06/02/2013  . History of encephalitis 11/18/2012  . Intertrochanteric fracture of right hip ([place]) 07/13/2012  . GERD (gastroesophageal reflux disease) 10/05/2011  . Seizure disorder ([place]) 05/24/2011  . Annual physical exam >>>>>>>>>>>>>>>>>>>>>>>>>>>>>>>.. 02/10/2011  . Atrial fibrillation ([place])   . 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 Hyperlipidemia 03/27/2006  . Hereditary and idiopathic peripheral neuropathy  03/27/2006  . 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 Essential hypertension 03/27/2006  . Osteopenia 03/27/2006    Past Surgical History:  Procedure Laterality Date  . ANKLE FRACTURE SURGERY Bilateral 08/2015   "right one was in 2 places"  . CARDIAC CATHETERIZATION N/A 05/17/2015   Procedure: Left Heart Cath and Coronary Angiography;  Surgeon: [name] Booze, MD;  Location: [facility];  Service: Cardiovascular;  Laterality: N/A;  . CARDIAC CATHETERIZATION N/A 05/17/2015   Procedure: Coronary Balloon Angioplasty;  Surgeon: [name] Booze, MD;  Location: [facility];  Service: Cardiovascular;  Laterality: N/A;  . FEMUR IM NAIL Right 07/15/2012   Procedure: INTRAMEDULLARY (IM) NAIL HIP;  Surgeon: [name] Bridge, MD;  Location: [facility];  Service: Orthopedics;  Laterality: Right;  . FEMUR IM NAIL Left 12/02/2015   Procedure: INTRAMEDULLARY (IM) NAIL FEMORAL;  Surgeon: [name] Killings, MD;  Location: [facility];  Service: Orthopedics;  Laterality: Left;  . FRACTURE SURGERY    . INCISION AND DRAINAGE HIP Left 01/03/2016   Procedure: IRRIGATION AND DEBRIDEMENT HIP;  Surgeon: [name] Killings, MD;  Location: [facility];  Service: Orthopedics;  Laterality: Left;  . SHOULDER SURGERY Bilateral    "don't remember which side"     OB History   No obstetric history on file.      Home Medications    Prior to Admission medications   Medication Sig Start Date End Date Taking? Authorizing Provider  aspirin EC 81 MG tablet Take 1 tablet (81 mg total) by mouth daily. 04/12/18   [name] Quan, PA-C  atorvastatin (LIPITOR) 80 MG tablet Take 1 tablet (80 mg total) by mouth daily. 01/21/18   [name] Branch, MD  calcium-vitamin D (OSCAL WITH D) 500-200 MG-UNIT per tablet Take 1 tablet by mouth daily.    [provider]  cephALEXin (KEFLEX) 500 MG capsule Take 1 capsule (500 mg total) by mouth 3 (three) times daily for 5 days. 07/15/18 07/20/18  [name] Bruce, MD  docusate sodium (COLACE) 100  MG capsule Take 1 capsule (100 mg total) by mouth daily for 7 days. While taking pain medications 07/15/18 07/22/18  [name] Bruce, MD  escitalopram (LEXAPRO) 10 MG tablet TAKE 2 TABLETS BY MOUTH ONCE DAILY Patient taking differently: Take 10 mg by mouth daily.  03/25/17   Copland, Gay Filler, MD  furosemide (LASIX) 40 MG tablet Take 1 tablet (40 mg total) by mouth 2 (two) times daily as needed for fluid or edema. 07/29/17   [name] Branch, MD  HYDROcodone-Acetaminophen 7.5-300 MG TABS Take 1 tablet by mouth 4 (four) times daily as needed. 07/18/18   [name] Branch, MD  insulin NPH-regular Human (NOVOLIN 70/30 RELION) (70-30) 100 UNIT/ML injection 31 units with breakfast and 4 units with evening meal. Patient taking differently: Inject 4-31 Units into the skin 2 (two) times daily with a meal. 31 units with breakfast and 4 units with evening meal. 07/12/18   Renato Shin, MD  metoprolol succinate (TOPROL-XL) 25 MG 24 hr tablet  Take 1 tablet (25 mg total) by mouth daily. Take with or immediately following a meal. 01/21/18   [name] Branch, MD  Multiple Vitamins-Minerals (MULTIVITAMIN ADULT PO) Take 1 tablet by mouth daily with breakfast.     [provider]  oxyCODONE-acetaminophen (PERCOCET/ROXICET) 5-325 MG tablet Take 1-2 tablets by mouth every 6 (six) hours as needed for moderate pain or severe pain. 07/15/18   [name] Bruce, MD  pantoprazole (PROTONIX) 40 MG tablet Take 1 tablet (40 mg total) by mouth daily. 10/08/16   [name] Branch, MD  warfarin (COUMADIN) 2.5 MG tablet TAKE AS DIRECTED BY  COUMADIN  CLINIC Patient taking differently: Take 2.5 mg by mouth daily.  01/03/18   Evans Lance, MD    Family History Family History  Problem Relation Age of Onset  . Diabetes Father   . Heart attack Father 30  . Diabetes Sister   . Drug abuse Son   . Cancer Neg Hx        no hx of [name] or breast cancer    Social History Social History   Tobacco Use  . Smoking status: Former Smoker    Types:  Cigarettes    Last attempt to quit: 03/02/1978    Years since quitting: 40.4  . Smokeless tobacco: Never Used  Substance Use Topics  . Alcohol use: No    Alcohol/week: 0.0 standard drinks  . Drug use: No     Allergies   Procaine hcl and Amoxicillin   Review of Systems Review of Systems  Constitutional: Negative for chills and fever.  Respiratory: Negative for shortness of breath.   Cardiovascular: Negative for chest pain.  Gastrointestinal: Negative for abdominal pain, nausea and vomiting.  Genitourinary: Negative for dysuria, frequency, hematuria and urgency.  Musculoskeletal: Positive for back pain.  Neurological: Negative for weakness and numbness.  All other systems reviewed and are negative.    Physical Exam Updated Vital Signs BP 136/77   Pulse 65   Temp 97.7 F (36.5 C) (Oral)   Resp 18   Wt 86.2 kg   SpO2 99%   BMI 30.67 kg/m   Physical Exam Vitals signs and nursing note reviewed.  Constitutional:      General: She is not in acute distress.    Appearance: She is well-developed.  HENT:     Head: Normocephalic and atraumatic.  Eyes:     General:        Right eye: No discharge.        Left eye: No discharge.     Conjunctiva/sclera: Conjunctivae normal.  Neck:     Musculoskeletal: Normal range of motion and neck supple.     Vascular: No JVD.     Trachea: No tracheal deviation.  Cardiovascular:     Rate and Rhythm: Normal rate.     Pulses: Normal pulses.     Comments: 2+ DP/PT pulses bilaterally. Homan's sign bilaterally, no peripheral edema. Pulmonary:     Effort: Pulmonary effort is normal.  Abdominal:     General: Abdomen is flat. Bowel sounds are normal. There is no distension.     Palpations: Abdomen is soft.     Tenderness: There is no abdominal tenderness. There is no right CVA tenderness, left CVA tenderness or guarding.  Musculoskeletal:     Comments: Midline lumbar spine tenderness at around the levels of L2-3 through S1.  Right paralumbar  muscle tenderness and right SI joint tenderness noted.  There is some tenderness to palpation to the  lateral aspect of the right hip.  No erythema or induration.  Normal active and passive range of motion.  5/5 strength of BLE major muscle groups.   Skin:    General: Skin is warm and dry.     Findings: No erythema.  Neurological:     Mental Status: She is alert.     Comments: Fluent speech, no facial droop, sensation intact to soft touch bilateral lower extremities.  Unable to assess gait secondary to pain.  Psychiatric:        Behavior: Behavior normal.      ED Treatments / Results  Labs (all labs ordered are listed, but only abnormal results are displayed) Labs Reviewed  BASIC METABOLIC PANEL - Abnormal; Notable for the following components:      Result Value   Glucose, Bld 297 (*)    BUN 36 (*)    Creatinine, Ser 1.72 (*)    GFR calc non Af Amer 28 (*)    GFR calc Af Amer 33 (*)    All other components within normal limits  CBC WITH DIFFERENTIAL/PLATELET - Abnormal; Notable for the following components:   Platelets 419 (*)    Monocytes Absolute 1.1 (*)    All other components within normal limits  SARS CORONAVIRUS 2 (HOSPITAL ORDER, [facility])    EKG None  Radiology Mr Lumbar Spine Wo Contrast  Result Date: 07/18/2018 CLINICAL DATA:  Severe low back pain. EXAM: MRI LUMBAR SPINE WITHOUT CONTRAST TECHNIQUE: Multiplanar, multisequence MR imaging of the lumbar spine was performed. No intravenous contrast was administered. COMPARISON:  CT lumbar spine dated Jul 15, 2018. CT abdomen pelvis dated May 20, 2015. FINDINGS: Segmentation:  Standard. Alignment: Unchanged trace retrolisthesis at L3-L4. Healed S1 fracture with residual 6 mm anterolisthesis at S1-S2. Vertebrae: Mild acute to subacute L1 inferior endplate compression fracture with 20% height loss. No retropulsion. No evidence of discitis or focal bone lesion. Conus medullaris and cauda equina:  Conus extends to the L1-L2 level. Conus and cauda equina appear normal. Paraspinal and other soft tissues: Bilateral renal atrophy and cysts. Disc levels: T12-L1:  Minimal disc bulging.  No stenosis. L1-L2: Minimal disc bulging. Prominent ligamentum flavum hypertrophy. Mild-to-moderate spinal canal stenosis. No neuroforaminal stenosis. L2-L3: Circumferential disc osteophyte complex. Mild bilateral facet arthropathy and ligamentum flavum hypertrophy. Severe spinal canal and lateral recess stenosis. No neuroforaminal stenosis. L3-L4: Circumferential disc bulging posterior right-sided disc osteophyte complex. Mild bilateral facet arthropathy with ligamentum flavum hypertrophy. Severe spinal canal and lateral recess stenosis. Moderate right and mild left neuroforaminal stenosis. L4-L5: Mild disc bulging with superimposed tiny central disc protrusion. Mild bilateral facet arthropathy. Mild moderate spinal canal stenosis. Severe left and moderate right lateral recess stenosis. Moderate to severe left and mild right neuroforaminal stenosis. L5-S1: Small central disc protrusion and annular fissure. Severe left and mild right facet arthropathy. Severe left and mild right neuroforaminal stenosis. No spinal canal stenosis. IMPRESSION: 1. Acute to subacute mild L1 inferior endplate compression fracture with 20% height loss. No retropulsion. 2. Advanced multilevel lumbar spondylosis as described above. Severe spinal canal stenosis at L2-L3 and L3-L4. Severe left neuroforaminal stenosis at L4-L5 and L5-S1. Electronically Signed   By: Titus Dubin M.D.   On: 07/18/2018 22:19    Procedures Procedures (including critical care time)  Medications Ordered in ED Medications  lidocaine (LIDODERM) 5 % 1 patch (1 patch Transdermal Patch Applied 07/18/18 2306)  HYDROmorphone (DILAUDID) injection 0.5 mg (0.5 mg Intravenous Given 07/18/18 1852)  sodium  chloride 0.9 % bolus 500 mL (500 mLs Intravenous New Bag/Given 07/18/18 2304)   cyclobenzaprine (FLEXERIL) tablet 5 mg (5 mg Oral Given 07/18/18 2311)     Initial Impression / Assessment and Plan / ED Course  I have reviewed the triage vital signs and the nursing notes.  Pertinent labs & imaging results that were available during my care of the patient were reviewed by me and considered in my medical decision making (see chart for details).        Patient presenting for evaluation of ongoing and worsening atraumatic right-sided low back pain for 4 days.  Also being treated for UTI.  She is afebrile, vital signs are stable.  Nontoxic in appearance. Reports she has not been ambulatory since her pain began and percocet has not been helpful. Neurovascularly intact on examination. She does have midline lumbar spine tenderness.  With persistent pain and known severe degenerative disease of the lumbar spine, will obtain MRI for further evaluation and rule out of cauda equina syndrome.  Lab work reviewed by me shows no leukocytosis, no anemia.  Her BUN and creatinine are acutely elevated, likely secondary to mild dehydration due to limitations from pain. Will give small fluid bolus.   MRI shows acute to subacute mild L1 inferior endplate compression fracture with 20% height loss and no retropulsion as well as multilevel advanced lumbar spondylosis, canal stenosis, and neuroforaminal stenosis.  She was given pain medicine in the ED but was unable to ambulate secondary to pain.  Will require admission for pain control and further evaluation and management, possible neurosurgical consultation.  Will order TLSO brace.  Spoke with Dr. Hal Hope with [facility] hospitalist service who agrees to assume care of patient and bring her to the hospital for further evaluation and management.  Final Clinical Impressions(s) / ED Diagnoses   Final diagnoses:  Intractable low back pain  Closed compression fracture of body of L1 vertebra (HCC)  Spinal stenosis of lumbar region, unspecified whether  neurogenic claudication present  Elevated serum creatinine    ED Discharge Orders    None       Debroah Baller 07/18/18 2345    Charlesetta Shanks, MD 07/27/18 1709

## 2018-07-18 NOTE — ED Provider Notes (Signed)
Medical screening examination/treatment/procedure(s) were conducted as a shared visit with non-physician practitioner(s) and myself.  I personally evaluated the patient during the encounter.  None Patient has had increasing severe right lower back pain and hip pain for about 4 days.  No bowel or bladder incontinence.  No fevers.  No recent injury but severe pain with trying to walk.  She is taking Keflex.  She had a urine culture grow Klebsiella pneumoniae and strep viridans.  Alert and appropriate.  No respiratory distress at rest.  Abdomen soft and nondistended.  distal pulses are symmetric.  I agree with plan and management.   Charlesetta Shanks, MD 07/27/18 [phone]

## 2018-07-18 NOTE — ED Notes (Signed)
ED TO INPATIENT HANDOFF REPORT  ED Nurse Name and Phone #: Judye Bos Name/Age/Gender [name] [name] 78 y.o. female Room/Bed: WA17/WA17  Code Status   Code Status: Prior  Home/SNF/Other Home Patient oriented to: self, place, time and situation Is this baseline? Yes   Triage Complete: Triage complete  Chief Complaint back pain  Triage Note Patient BIB EMS from home with complaints of bilateral lower back pain. Patient was diagnosed with sciatica at [facility] on 07/15/18. Patient took oxycodone this morning, then took hydrocodone at approx 1500 this afternoon. Pt reports the pain is still "the worst she has ever felt in her life." Patient denies distal paresthesias. Patient ambulatory for EMS to stretcher.    Allergies Allergies  Allergen Reactions  . Procaine Hcl Anaphylaxis  . Amoxicillin Itching     Did it involve swelling of the face/tongue/throat, SOB, or low BP? No Did it involve sudden or severe rash/hives, skin peeling, or any reaction on the inside of your mouth or nose? No Did you need to seek medical attention at a hospital or doctor's office? No When did it last happen?unk If all above answers are "NO", may proceed with cephalosporin use.     Level of Care/Admitting Diagnosis ED Disposition    ED Disposition Condition Comment   Admit  Hospital Area: [place] [[id number]]  Level of Care: Telemetry [5]  Admit to tele based on following criteria: Monitor for Ischemic changes  Covid Evaluation: Screening Protocol (No Symptoms)  Diagnosis: Closed L1 vertebral fracture [facility]) [093235]  Admitting Physician: Rise Patience [phone]  Attending Physician: Rise Patience Lei.Right  PT Class (Do Not Modify): Observation [104]  PT Acc Code (Do Not Modify): Observation [10022]       B Medical/Surgery History Past Medical History:  Diagnosis Date  . Atrial fibrillation ([place])    SVT dx 2007, cath 2007 mild CAD, then had an cardioversion,  ablation; still on coumadin , has occ palpitation, EKG 03-2010 NSR  . Blindness of left eye   . CKD (chronic kidney disease), stage III ([place]) 06/16/2016   [name] 06/17/2016  . DIABETES MELLITUS, TYPE II 03/27/2006   dr [name]  . Eye muscle weakness    Right eye weakness after cataract surgery  . GERD (gastroesophageal reflux disease) 10/05/2011  . 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 HYPERLIPIDEMIA 03/27/2006  . HYPERTENSION 03/27/2006  . Intertrochanteric fracture of right hip ([place]) 07/13/2012  . 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 Osteopenia 2004   Dexa 2004 showed Osteopenia, DEXA 03/2007 normal  . Osteopenia   . Other chronic cystitis with hematuria   . Pelvic fracture ([place]) 06/16/2016   S/P fall  . Recurrent urinary tract infection    Seeing Urology  . 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 Seizures ([place])   . 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[place]) 04/03/5425   Past Surgical History:  Procedure Laterality Date  . ANKLE FRACTURE SURGERY Bilateral 08/2015   "right one was in 2 places"  . CARDIAC CATHETERIZATION N/A 05/17/2015   Procedure: Left Heart Cath and Coronary Angiography;  Surgeon: Jettie Booze, MD;  Location: [place] CV LAB;  Service: Cardiovascular;  Laterality: N/A;  . CARDIAC CATHETERIZATION N/A 05/17/2015   Procedure: Coronary Balloon Angioplasty;  Surgeon: Jettie Booze, MD;  Location: [place] CV LAB;  Service: Cardiovascular;  Laterality: N/A;  . FEMUR IM NAIL Right 07/15/2012   Procedure:  INTRAMEDULLARY (IM) NAIL HIP;  Surgeon: Johnny Bridge, MD;  Location: [place];  Service: Orthopedics;  Laterality: Right;  . FEMUR IM NAIL Left 12/02/2015   Procedure: INTRAMEDULLARY (IM) NAIL FEMORAL;  Surgeon: [name], MD;  Location: [place];  Service: Orthopedics;  Laterality: Left;  . FRACTURE SURGERY    . INCISION AND DRAINAGE HIP Left 01/03/2016   Procedure: IRRIGATION AND DEBRIDEMENT HIP;  Surgeon: [name], MD;   Location: [place];  Service: Orthopedics;  Laterality: Left;  . SHOULDER SURGERY Bilateral    "don't remember which side"     A IV Location/Drains/Wounds Patient Lines/Drains/Airways Status   Active Line/Drains/Airways    Name:   Placement date:   Placement time:   Site:   Days:   Peripheral IV 07/18/18 Right Antecubital   07/18/18    1851    Antecubital   less than 1          Intake/Output Last 24 hours No intake or output data in the 24 hours ending 07/18/18 2349  Labs/Imaging Results for orders placed or performed during the hospital encounter of 07/18/18 (from the past 48 hour(s))  Basic metabolic panel     Status: Abnormal   Collection Time: 07/18/18  6:50 PM  Result Value Ref Range   Sodium 138 135 - 145 mmol/L   Potassium 3.8 3.5 - 5.1 mmol/L   Chloride 100 98 - 111 mmol/L   CO2 25 22 - 32 mmol/L   Glucose, Bld 297 (H) 70 - 99 mg/dL   BUN 36 (H) 8 - 23 mg/dL   Creatinine, Ser 1.72 (H) 0.44 - 1.00 mg/dL   Calcium 9.1 8.9 - 10.3 mg/dL   GFR calc non Af Amer 28 (L) >60 mL/min   GFR calc Af Amer 33 (L) >60 mL/min   Anion gap 13 5 - 15    Comment: Performed at [facility], [place] [address] [place], [place] [address]  CBC with Differential     Status: Abnormal   Collection Time: 07/18/18  6:50 PM  Result Value Ref Range   WBC 10.3 4.0 - 10.5 K/uL   RBC 4.13 3.87 - 5.11 MIL/uL   Hemoglobin 12.7 12.0 - 15.0 g/dL   HCT 38.7 36.0 - 46.0 %   MCV 93.7 80.0 - 100.0 fL   MCH 30.8 26.0 - 34.0 pg   MCHC 32.8 30.0 - 36.0 g/dL   RDW 13.3 11.5 - 15.5 %   Platelets 419 (H) 150 - 400 K/uL   nRBC 0.0 0.0 - 0.2 %   Neutrophils Relative % 58 %   Neutro Abs 6.0 1.7 - 7.7 K/uL   Lymphocytes Relative 27 %   Lymphs Abs 2.8 0.7 - 4.0 K/uL   Monocytes Relative 11 %   Monocytes Absolute 1.1 (H) 0.1 - 1.0 K/uL   Eosinophils Relative 3 %   Eosinophils Absolute 0.3 0.0 - 0.5 K/uL   Basophils Relative 1 %   Basophils Absolute 0.1 0.0 - 0.1 K/uL   Immature Granulocytes  0 %   Abs Immature Granulocytes 0.03 0.00 - 0.07 K/uL    Comment: Performed at [facility], [place] [address] [place], [place] [address]   Mr Lumbar Spine Wo Contrast  Result Date: 07/18/2018 CLINICAL DATA:  Severe low back pain. EXAM: MRI LUMBAR SPINE WITHOUT CONTRAST TECHNIQUE: Multiplanar, multisequence MR imaging of the lumbar spine was performed. No intravenous contrast was administered. COMPARISON:  CT lumbar spine dated Jul 15, 2018. CT abdomen pelvis dated May 20, 2015. FINDINGS: Segmentation:  Standard. Alignment: Unchanged trace retrolisthesis at L3-L4. Healed S1 fracture with residual 6 mm anterolisthesis at S1-S2. Vertebrae: Mild acute to subacute L1 inferior endplate compression fracture with 20% height loss. No retropulsion. No evidence of discitis or focal bone lesion. Conus medullaris and cauda equina: Conus extends to the L1-L2 level. Conus and cauda equina appear normal. Paraspinal and other soft tissues: Bilateral renal atrophy and cysts. Disc levels: T12-L1:  Minimal disc bulging.  No stenosis. L1-L2: Minimal disc bulging. Prominent ligamentum flavum hypertrophy. Mild-to-moderate spinal canal stenosis. No neuroforaminal stenosis. L2-L3: Circumferential disc osteophyte complex. Mild bilateral facet arthropathy and ligamentum flavum hypertrophy. Severe spinal canal and lateral recess stenosis. No neuroforaminal stenosis. L3-L4: Circumferential disc bulging posterior right-sided disc osteophyte complex. Mild bilateral facet arthropathy with ligamentum flavum hypertrophy. Severe spinal canal and lateral recess stenosis. Moderate right and mild left neuroforaminal stenosis. L4-L5: Mild disc bulging with superimposed tiny central disc protrusion. Mild bilateral facet arthropathy. Mild moderate spinal canal stenosis. Severe left and moderate right lateral recess stenosis. Moderate to severe left and mild right neuroforaminal stenosis. L5-S1: Small central disc protrusion and  annular fissure. Severe left and mild right facet arthropathy. Severe left and mild right neuroforaminal stenosis. No spinal canal stenosis. IMPRESSION: 1. Acute to subacute mild L1 inferior endplate compression fracture with 20% height loss. No retropulsion. 2. Advanced multilevel lumbar spondylosis as described above. Severe spinal canal stenosis at L2-L3 and L3-L4. Severe left neuroforaminal stenosis at L4-L5 and L5-S1. Electronically Signed   By: Titus Dubin M.D.   On: 07/18/2018 22:19    Pending Labs Unresulted Labs (From admission, onward)    Start     Ordered   07/18/18 2303  SARS Coronavirus 2 (CEPHEID - Performed in [facility] hospital lab), Hosp Order  (Asymptomatic Patients Labs)  Once,   R    Question:  Rule Out  Answer:  Yes   07/18/18 2302          Vitals/Pain Today's Vitals   07/18/18 2045 07/18/18 2100 07/18/18 2306 07/18/18 2310  BP:  (!) 109/49 (!) 146/60 136/77  Pulse: (!) 56 (!) 59 67 65  Resp:  17 18   Temp:      TempSrc:      SpO2: 97% 94% 100% 99%  Weight:      PainSc:        Isolation Precautions No active isolations  Medications Medications  lidocaine (LIDODERM) 5 % 1 patch (1 patch Transdermal Patch Applied 07/18/18 2306)  HYDROmorphone (DILAUDID) injection 0.5 mg (0.5 mg Intravenous Given 07/18/18 1852)  sodium chloride 0.9 % bolus 500 mL (500 mLs Intravenous New Bag/Given 07/18/18 2304)  cyclobenzaprine (FLEXERIL) tablet 5 mg (5 mg Oral Given 07/18/18 2311)    Mobility walks with device Moderate fall risk   Focused Assessments NA   R Recommendations: See Admitting Provider Note  Report given to:   Additional Notes: NA

## 2018-07-18 NOTE — ED Notes (Signed)
Patient transported to MRI 

## 2018-07-18 NOTE — ED Triage Notes (Signed)
Patient BIB EMS from home with complaints of bilateral lower back pain. Patient was diagnosed with sciatica at [facility] on 07/15/18. Patient took oxycodone this morning, then took hydrocodone at approx 1500 this afternoon. Pt reports the pain is still "the worst she has ever felt in her life." Patient denies distal paresthesias. Patient ambulatory for EMS to stretcher.

## 2018-07-18 NOTE — ED Notes (Signed)
Bed: WA17 Expected date:  Expected time:  Means of arrival:  Comments: EMS 77yo back pain

## 2018-07-18 NOTE — ED Notes (Signed)
A person named Claudina Lick, who identified himself as the pt's grandson, called regarding pt and wanted information. I told him that if this pt is here, then we will have her call him. He said the pt hasn't been able to smell or taste since last Friday.

## 2018-07-19 DIAGNOSIS — M4856XA Collapsed vertebra, not elsewhere classified, lumbar region, initial encounter for fracture: Secondary | ICD-10-CM | POA: Diagnosis present

## 2018-07-19 DIAGNOSIS — S32010A Wedge compression fracture of first lumbar vertebra, initial encounter for closed fracture: Secondary | ICD-10-CM | POA: Insufficient documentation

## 2018-07-19 DIAGNOSIS — Z794 Long term (current) use of insulin: Secondary | ICD-10-CM | POA: Diagnosis not present

## 2018-07-19 DIAGNOSIS — N183 Chronic kidney disease, stage 3 (moderate): Secondary | ICD-10-CM | POA: Diagnosis present

## 2018-07-19 DIAGNOSIS — I130 Hypertensive heart and chronic kidney disease with heart failure and stage 1 through stage 4 chronic kidney disease, or unspecified chronic kidney disease: Secondary | ICD-10-CM | POA: Diagnosis present

## 2018-07-19 DIAGNOSIS — H5462 Unqualified visual loss, left eye, normal vision right eye: Secondary | ICD-10-CM | POA: Diagnosis present

## 2018-07-19 DIAGNOSIS — N390 Urinary tract infection, site not specified: Secondary | ICD-10-CM | POA: Diagnosis present

## 2018-07-19 DIAGNOSIS — Z79899 Other long term (current) drug therapy: Secondary | ICD-10-CM | POA: Diagnosis not present

## 2018-07-19 DIAGNOSIS — I2510 Atherosclerotic heart disease of native coronary artery without angina pectoris: Secondary | ICD-10-CM | POA: Diagnosis present

## 2018-07-19 DIAGNOSIS — M48061 Spinal stenosis, lumbar region without neurogenic claudication: Secondary | ICD-10-CM | POA: Diagnosis present

## 2018-07-19 DIAGNOSIS — E1122 Type 2 diabetes mellitus with diabetic chronic kidney disease: Secondary | ICD-10-CM | POA: Diagnosis present

## 2018-07-19 DIAGNOSIS — Z7982 Long term (current) use of aspirin: Secondary | ICD-10-CM | POA: Diagnosis not present

## 2018-07-19 DIAGNOSIS — S32018A Other fracture of first lumbar vertebra, initial encounter for closed fracture: Secondary | ICD-10-CM | POA: Diagnosis not present

## 2018-07-19 DIAGNOSIS — Z87440 Personal history of urinary (tract) infections: Secondary | ICD-10-CM | POA: Diagnosis not present

## 2018-07-19 DIAGNOSIS — Z1159 Encounter for screening for other viral diseases: Secondary | ICD-10-CM | POA: Diagnosis not present

## 2018-07-19 DIAGNOSIS — M47816 Spondylosis without myelopathy or radiculopathy, lumbar region: Secondary | ICD-10-CM | POA: Diagnosis present

## 2018-07-19 DIAGNOSIS — Z955 Presence of coronary angioplasty implant and graft: Secondary | ICD-10-CM | POA: Diagnosis not present

## 2018-07-19 DIAGNOSIS — I255 Ischemic cardiomyopathy: Secondary | ICD-10-CM | POA: Diagnosis present

## 2018-07-19 DIAGNOSIS — E785 Hyperlipidemia, unspecified: Secondary | ICD-10-CM | POA: Diagnosis present

## 2018-07-19 DIAGNOSIS — B961 Klebsiella pneumoniae [K. pneumoniae] as the cause of diseases classified elsewhere: Secondary | ICD-10-CM | POA: Diagnosis present

## 2018-07-19 DIAGNOSIS — Z833 Family history of diabetes mellitus: Secondary | ICD-10-CM | POA: Diagnosis not present

## 2018-07-19 DIAGNOSIS — Z87891 Personal history of nicotine dependence: Secondary | ICD-10-CM | POA: Diagnosis not present

## 2018-07-19 DIAGNOSIS — G40909 Epilepsy, unspecified, not intractable, without status epilepticus: Secondary | ICD-10-CM | POA: Diagnosis present

## 2018-07-19 DIAGNOSIS — Z7901 Long term (current) use of anticoagulants: Secondary | ICD-10-CM | POA: Diagnosis not present

## 2018-07-19 DIAGNOSIS — I480 Paroxysmal atrial fibrillation: Secondary | ICD-10-CM | POA: Diagnosis present

## 2018-07-19 DIAGNOSIS — I5022 Chronic systolic (congestive) heart failure: Secondary | ICD-10-CM | POA: Diagnosis present

## 2018-07-19 DIAGNOSIS — M545 Low back pain: Secondary | ICD-10-CM | POA: Diagnosis present

## 2018-07-19 LAB — BASIC METABOLIC PANEL
Anion gap: 10 (ref 5–15)
BUN: 41 mg/dL — ABNORMAL HIGH (ref 8–23)
CO2: 28 mmol/L (ref 22–32)
Calcium: 9 mg/dL (ref 8.9–10.3)
Chloride: 100 mmol/L (ref 98–111)
Creatinine, Ser: 1.9 mg/dL — ABNORMAL HIGH (ref 0.44–1.00)
GFR calc Af Amer: 29 mL/min — ABNORMAL LOW (ref >60)
GFR calc non Af Amer: 25 mL/min — ABNORMAL LOW (ref >60)
Glucose, Bld: 293 mg/dL — ABNORMAL HIGH (ref 70–99)
Potassium: 4.6 mmol/L (ref 3.5–5.1)
Sodium: 138 mmol/L (ref 135–145)

## 2018-07-19 LAB — GLUCOSE, CAPILLARY
Glucose-Capillary: 180 mg/dL — ABNORMAL HIGH (ref 70–99)
Glucose-Capillary: 242 mg/dL — ABNORMAL HIGH (ref 70–99)
Glucose-Capillary: 282 mg/dL — ABNORMAL HIGH (ref 70–99)
Glucose-Capillary: 92 mg/dL (ref 70–99)
Glucose-Capillary: 96 mg/dL (ref 70–99)

## 2018-07-19 LAB — URINE CULTURE: Culture: >=100000 — AB

## 2018-07-19 LAB — CBC
HCT: 38 % (ref 36.0–46.0)
Hemoglobin: 12.2 g/dL (ref 12.0–15.0)
MCH: 30.3 pg (ref 26.0–34.0)
MCHC: 32.1 g/dL (ref 30.0–36.0)
MCV: 94.5 fL (ref 80.0–100.0)
Platelets: 399 10*3/uL (ref 150–400)
RBC: 4.02 MIL/uL (ref 3.87–5.11)
RDW: 13.3 % (ref 11.5–15.5)
WBC: 7.9 10*3/uL (ref 4.0–10.5)
nRBC: 0 % (ref 0.0–0.2)

## 2018-07-19 LAB — SARS CORONAVIRUS 2 BY RT PCR (HOSPITAL ORDER, PERFORMED IN ~~LOC~~ HOSPITAL LAB): SARS Coronavirus 2: NEGATIVE

## 2018-07-19 LAB — MRSA PCR SCREENING: MRSA by PCR: NEGATIVE

## 2018-07-19 LAB — PROTIME-INR
INR: 2.3 — ABNORMAL HIGH (ref 0.8–1.2)
Prothrombin Time: 25.4 seconds — ABNORMAL HIGH (ref 11.4–15.2)

## 2018-07-19 MED ORDER — WARFARIN - PHARMACIST DOSING INPATIENT: Freq: Every day | Status: DC

## 2018-07-19 MED ORDER — ONDANSETRON HCL 4 MG PO TABS
4.0000 mg | ORAL_TABLET | Freq: Four times a day (QID) | ORAL | Status: DC | PRN
  Administered 2018-07-20: 4 mg via ORAL
  Filled 2018-07-19: qty 1

## 2018-07-19 MED ORDER — DOCUSATE SODIUM 100 MG PO CAPS
100.0000 mg | ORAL_CAPSULE | Freq: Every day | ORAL | Status: DC
  Administered 2018-07-19 – 2018-07-20 (×2): 100 mg via ORAL
  Filled 2018-07-19 (×2): qty 1

## 2018-07-19 MED ORDER — METOPROLOL SUCCINATE ER 25 MG PO TB24
25.0000 mg | ORAL_TABLET | Freq: Every day | ORAL | Status: DC
  Administered 2018-07-19 – 2018-07-20 (×2): 25 mg via ORAL
  Filled 2018-07-19 (×2): qty 1

## 2018-07-19 MED ORDER — PANTOPRAZOLE SODIUM 40 MG PO TBEC
40.0000 mg | DELAYED_RELEASE_TABLET | Freq: Every day | ORAL | Status: DC
  Administered 2018-07-19 – 2018-07-20 (×2): 40 mg via ORAL
  Filled 2018-07-19 (×2): qty 1

## 2018-07-19 MED ORDER — MORPHINE SULFATE (PF) 2 MG/ML IV SOLN
1.0000 mg | INTRAVENOUS | Status: DC | PRN
  Administered 2018-07-19 (×2): 1 mg via INTRAVENOUS
  Filled 2018-07-19 (×2): qty 1

## 2018-07-19 MED ORDER — ASPIRIN EC 81 MG PO TBEC
81.0000 mg | DELAYED_RELEASE_TABLET | Freq: Every day | ORAL | Status: DC
  Administered 2018-07-19 – 2018-07-20 (×2): 81 mg via ORAL
  Filled 2018-07-19 (×2): qty 1

## 2018-07-19 MED ORDER — WARFARIN SODIUM 2.5 MG PO TABS
2.5000 mg | ORAL_TABLET | Freq: Every day | ORAL | Status: DC
  Administered 2018-07-19: 2.5 mg via ORAL
  Filled 2018-07-19: qty 1

## 2018-07-19 MED ORDER — FUROSEMIDE 40 MG PO TABS: 40.0000 mg | ORAL_TABLET | Freq: Two times a day (BID) | ORAL | Status: DC | PRN

## 2018-07-19 MED ORDER — ACETAMINOPHEN 325 MG PO TABS: 650.0000 mg | ORAL_TABLET | Freq: Four times a day (QID) | ORAL | Status: DC | PRN

## 2018-07-19 MED ORDER — ESCITALOPRAM OXALATE 10 MG PO TABS
10.0000 mg | ORAL_TABLET | Freq: Every day | ORAL | Status: DC
  Administered 2018-07-19 – 2018-07-20 (×2): 10 mg via ORAL
  Filled 2018-07-19 (×2): qty 1

## 2018-07-19 MED ORDER — ONDANSETRON HCL 4 MG/2ML IJ SOLN
4.0000 mg | Freq: Four times a day (QID) | INTRAMUSCULAR | Status: DC | PRN
  Administered 2018-07-19: 4 mg via INTRAVENOUS
  Filled 2018-07-19: qty 2

## 2018-07-19 MED ORDER — ACETAMINOPHEN 650 MG RE SUPP: 650.0000 mg | Freq: Four times a day (QID) | RECTAL | Status: DC | PRN

## 2018-07-19 MED ORDER — INSULIN ASPART 100 UNIT/ML ~~LOC~~ SOLN
0.0000 [IU] | Freq: Three times a day (TID) | SUBCUTANEOUS | Status: DC
  Administered 2018-07-19: 2 [IU] via SUBCUTANEOUS
  Administered 2018-07-19: 3 [IU] via SUBCUTANEOUS
  Administered 2018-07-20: 1 [IU] via SUBCUTANEOUS

## 2018-07-19 MED ORDER — HYDROCODONE-ACETAMINOPHEN 5-325 MG PO TABS
1.0000 | ORAL_TABLET | Freq: Four times a day (QID) | ORAL | Status: DC | PRN
  Administered 2018-07-19 – 2018-07-20 (×2): 1 via ORAL
  Filled 2018-07-19 (×2): qty 1

## 2018-07-19 MED ORDER — ATORVASTATIN CALCIUM 40 MG PO TABS
80.0000 mg | ORAL_TABLET | Freq: Every day | ORAL | Status: DC
  Administered 2018-07-19 – 2018-07-20 (×2): 80 mg via ORAL
  Filled 2018-07-19 (×2): qty 2

## 2018-07-19 MED ORDER — CALCIUM CARBONATE-VITAMIN D 500-200 MG-UNIT PO TABS
1.0000 | ORAL_TABLET | Freq: Every day | ORAL | Status: DC
  Administered 2018-07-19 – 2018-07-20 (×2): 1 via ORAL
  Filled 2018-07-19 (×2): qty 1

## 2018-07-19 MED ORDER — INSULIN ASPART PROT & ASPART (70-30 MIX) 100 UNIT/ML ~~LOC~~ SUSP
31.0000 [IU] | Freq: Every day | SUBCUTANEOUS | Status: DC
  Administered 2018-07-19 – 2018-07-20 (×2): 31 [IU] via SUBCUTANEOUS
  Filled 2018-07-19: qty 10

## 2018-07-19 MED ORDER — INSULIN ASPART PROT & ASPART (70-30 MIX) 100 UNIT/ML ~~LOC~~ SUSP
4.0000 [IU] | Freq: Every day | SUBCUTANEOUS | Status: DC
  Administered 2018-07-19: 4 [IU] via SUBCUTANEOUS
  Filled 2018-07-19: qty 10

## 2018-07-19 NOTE — Progress Notes (Signed)
Patient with L1 inferior vertebral body compression fracture without retropulsion or severe stenosis.  Patient also with significant multilevel lumbar spondylosis and moderate stenosis at L2-3, L3-4 and L4-5.  Recommend treatment with TLSO.  Patient may be mobilized with therapy.  Hopeful her symptoms from her fracture will use over the next 2 weeks and will not require intervention.  Should her symptoms fail to improve over the next 2 weeks we could consider vertebroplasty at that time.

## 2018-07-19 NOTE — TOC Initial Note (Signed)
Transition of Care [facility]) - Initial/Assessment Note    Patient Details  Name: [name] [name] MRN: [id number] Date of Birth: 03-17-1940  [facility] CM/SW Contact:    Purcell Mouton, RN Phone Number: 07/19/2018, 4:27 PM  Clinical Narrative: Closed L1 vertebral fracture. Pt states she is going home with her husband.                  Expected Discharge Plan: [place]     Patient Goals and CMS Choice Patient states their goals for this hospitalization and ongoing recovery are:: Pt wans to go home   Choice offered to / list presented to : Patient  Expected Discharge Plan and Services Expected Discharge Plan: [place]       Living arrangements for the past 2 months: Single Family Home Expected Discharge Date: 07/20/18                         HH Arranged: PT [place] Agency: [place] ([place]) Date [place]: 07/19/18 Time [place]: 1627 Representative spoke with at [place]: Santiago Glad   Prior Living Arrangements/Services Living arrangements for the past 2 months: [place] with:: Spouse   Do you feel safe going back to the place where you live?: Yes               Activities of Daily Living Home Assistive Devices/Equipment: Environmental consultant (specify type), CBG Meter ADL Screening (condition at time of admission) Patient's cognitive ability adequate to safely complete daily activities?: No Is the patient deaf or have difficulty hearing?: No Does the patient have difficulty seeing, even when wearing glasses/contacts?: No Does the patient have difficulty concentrating, remembering, or making decisions?: No Patient able to express need for assistance with ADLs?: Yes Does the patient have difficulty dressing or bathing?: No Independently performs ADLs?: No Communication: Independent Dressing (OT): Independent Grooming: Independent Feeding: Independent Bathing:  Independent Toileting: Needs assistance Is this a change from baseline?: Change from baseline, expected to last <3 days In/Out Bed: Needs assistance Is this a change from baseline?: Change from baseline, expected to last <3 days Walks in Home: Needs assistance Is this a change from baseline?: Change from baseline, expected to last <3 days Does the patient have difficulty walking or climbing stairs?: No Weakness of Legs: Both Weakness of Arms/Hands: None  Permission Sought/Granted Permission sought to share information with : Case Manager                Emotional Assessment Appearance:: Appears stated age   Affect (typically observed): Accepting Orientation: : Oriented to Self, Oriented to Place, Oriented to  Time, Oriented to Situation      Admission diagnosis:  Elevated serum creatinine [R79.89] Intractable low back pain [M54.5] Spinal stenosis of lumbar region, unspecified whether neurogenic claudication present [M48.061] Closed compression fracture of body of L1 vertebra (HCC) [S32.010A] Closed L1 vertebral fracture ([place]) [S32.019A] Patient Active Problem List   Diagnosis Date Noted  . Closed compression fracture of L1 vertebra ([place]) 07/19/2018  . Closed compression fracture of body of L1 vertebra ([place])   . Closed L1 vertebral fracture ([place]) 07/18/2018  . Cardiomyopathy ([place]) 04/07/2018  . Hyperglycemia 10/02/2016  . Foot ulcer ([place]) 07/24/2016  . Gram-negative infection   . CKD (chronic kidney disease) stage 3, GFR 30-59 ml/min (HCC)   . Postoperative anemia due to acute blood loss 01/04/2016  . Left hip  postoperative wound infection 01/02/2016  . CAD S/P percutaneous coronary angioplasty 05/20/2015  . Acute systolic CHF (congestive heart failure) ([place]) 05/20/2015  . Insulin dependent diabetes mellitus with complications ([place]) 07/62/2633  . Acute kidney injury superimposed on chronic kidney disease ([place]) 05/11/2015  . PCP NOTES >>>>>>>>>>>>>>>> 12/28/2014  .  Memory loss 05/17/2014  . Anxiety and depression, PCP notes  11/24/2013  . Severe obesity (BMI >= 40) ([place]) 07/14/2013  . Encounter for therapeutic drug monitoring 06/02/2013  . History of encephalitis 11/18/2012  . Intertrochanteric fracture of right hip ([place]) 07/13/2012  . GERD (gastroesophageal reflux disease) 10/05/2011  . Seizure disorder ([place]) 05/24/2011  . Annual physical exam >>>>>>>>>>>>>>>>>>>>>>>>>>>>>>>.. 02/10/2011  . Atrial fibrillation ([place])   . 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 Hyperlipidemia 03/27/2006  . Hereditary and idiopathic peripheral neuropathy 03/27/2006  . 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 Essential hypertension 03/27/2006  . Osteopenia 03/27/2006   PCP:  Colon Branch, MD Pharmacy:   [facility] [place], [place] [address] [place] [address] Phone: [phone] Fax: [phone]  [place], [place] [address] Phone: [phone] Fax: [phone]     Social Determinants of Health (SDOH) Interventions    Readmission Risk Interventions No flowsheet data found.

## 2018-07-19 NOTE — Evaluation (Addendum)
Physical Therapy Evaluation Patient Details Name: [name] [name] MRN: [id number] DOB: Feb 06, 1941 Today's Date: 07/19/2018   History of Present Illness  78 yo female admitted with L1 vertebral fx. Hx of R hip fx s/p IM nail 2014, L femur fx s/p IM nail 2017, blindness L eye, DM, A fib  Clinical Impression  On eval, pt required Min assist +2 for mobility. She was able to stand x 3 at the bedside. She was unable to take any pivotal or ambulatory steps. Mobility is significantly limited by pain. Pain rated 3/10 at rest and 10/10 with activity. Will continue to follow and progress activity as tolerated. At this time, recommendation is for SNF if pt will agree. If she declines placement, recommend max home health assistance.     Follow Up Recommendations SNF    Equipment Recommendations  None recommended by PT    Recommendations for Other Services       Precautions / Restrictions Precautions Precautions: Fall;Back Precaution Comments: logroll Required Braces or Orthoses: Spinal Brace Spinal Brace: Thoracolumbosacral orthotic;Applied in sitting position Restrictions Weight Bearing Restrictions: No      Mobility  Bed Mobility Overal bed mobility: Needs Assistance Bed Mobility: Rolling;Sidelying to Sit;Sit to Sidelying Rolling: Min assist Sidelying to sit: Min assist     Sit to sidelying: Min assist General bed mobility comments: Increased time. Reliance on bedrail. Assist for LEs.   Transfers Overall transfer level: Needs assistance Equipment used: Rolling walker (2 wheeled) Transfers: Sit to/from Stand Sit to Stand: From elevated surface;Min assist         General transfer comment: Assist to rise, stabilize, control descent. VCs safety, technique, hand placement. Increased time. Sit to stand x 3. Pt only able to stand for ~15-20 seconds before having to sit back down.   Ambulation/Gait             General Gait Details: Side steps along side of bed with a RW. Pt  only able ot tolerate taking a couple of side steps.   Stairs            Wheelchair Mobility    Modified Rankin (Stroke Patients Only)       Balance Overall balance assessment: Needs assistance;History of Falls         Standing balance support: Bilateral upper extremity supported Standing balance-Leahy Scale: Poor                               Pertinent Vitals/Pain Pain Assessment: 0-10 Pain Score: 10-Worst pain ever Pain Location: 3 at rest, 10 with activity Pain Descriptors / Indicators: Sharp;Aching;Grimacing;Guarding Pain Intervention(s): Limited activity within patient's tolerance;Repositioned;Premedicated before session    [place] expects to be discharged to:: Private residence Living Arrangements: Spouse/significant other Available Help at Discharge: Family;Available 24 hours/day   Home Access: Stairs to enter   Entrance Stairs-Number of Steps: 1 Home Layout: Two level;Able to live on main level with bedroom/bathroom Home Equipment: Gilford Rile - 2 wheels      Prior Function Level of Independence: Independent with assistive device(s)         Comments: uses RW for ambulation     Hand Dominance        Extremity/Trunk Assessment   Upper Extremity Assessment Upper Extremity Assessment: Generalized weakness    Lower Extremity Assessment Lower Extremity Assessment: Generalized weakness    Cervical / Trunk Assessment Cervical / Trunk Assessment: Normal  Communication   Communication: No  difficulties  Cognition Arousal/Alertness: Awake/alert Behavior During Therapy: WFL for tasks assessed/performed Overall Cognitive Status: Within Functional Limits for tasks assessed                                        General Comments      Exercises     Assessment/Plan    PT Assessment Patient needs continued PT services  PT Problem List Decreased strength;Decreased balance;Decreased mobility;Decreased  activity tolerance;Pain;Decreased knowledge of use of DME       PT Treatment Interventions DME instruction;Gait training;Therapeutic activities;Functional mobility training;Balance training;Patient/family education;Therapeutic exercise    PT Goals (Current goals can be found in the Care Plan section)  Acute Rehab PT Goals Patient Stated Goal: less pain.  PT Goal Formulation: With patient Time For Goal Achievement: 08/02/18 Potential to Achieve Goals: Good    Frequency Min 3X/week   Barriers to discharge        Co-evaluation               AM-PAC PT "6 Clicks" Mobility  Outcome Measure Help needed turning from your back to your side while in a flat bed without using bedrails?: A Little Help needed moving from lying on your back to sitting on the side of a flat bed without using bedrails?: A Little Help needed moving to and from a bed to a chair (including a wheelchair)?: A Lot Help needed standing up from a chair using your arms (e.g., wheelchair or bedside chair)?: A Lot Help needed to walk in hospital room?: A Lot Help needed climbing 3-5 steps with a railing? : Total 6 Click Score: 13    End of Session Equipment Utilized During Treatment: Gait belt Activity Tolerance: Patient limited by pain Patient left: in bed;with call bell/phone within reach;with bed alarm set   PT Visit Diagnosis: Muscle weakness (generalized) (M62.81);Pain;Difficulty in walking, not elsewhere classified (R26.2);History of falling (Z91.81) Pain - Right/Left: Right Pain - part of body: Hip(back)    Time: 2094-7096 PT Time Calculation (min) (ACUTE ONLY): 24 min   Charges:   PT Evaluation $PT Eval Moderate Complexity: 1 Mod PT Treatments $Therapeutic Activity: 8-22 mins         Weston Anna, PT Acute Rehabilitation Services Pager: [phone] Office: [phone]

## 2018-07-19 NOTE — ED Notes (Signed)
Spoke to [employer], he asked if the patient needs the TLSO brace urgently or if it can wait until the morning. Dr. Hal Hope stated she can have the brace placed in the morning. [employer] reports they will come first thing in the morning.

## 2018-07-19 NOTE — Telephone Encounter (Signed)
Noted  

## 2018-07-19 NOTE — H&P (Signed)
History and Physical    [name] [name] WUJ:[id number] DOB: February 21, 1941 DOA: 07/18/2018  PCP: Colon Branch, MD  Patient coming from: Home.  Chief Complaint: Low back pain.  HPI: [name] [name] is a 78 y.o. female with history of diabetes mellitus type 2, CAD status post angioplasty, ischemic cardiomyopathy with A. fib status post ablation, blindness of left eye, chronic kidney disease stage III who has come to the ER 3 days ago with complaint of sudden onset of low back pain after patient was try to get out of the bed.  Pain is mostly in the low back denies any radiation to the legs denies any incontinence of urine or bowel.  Denies any fall or trauma.  At the time patient had x-rays done and UA was considered UTI was discharged home on pain medication and antibiotics for UTI but despite taking which patient's pain has been persistently worsening patient presented again to the ER.  ED Course: In the ER this time MRI was done which showed L1 compression fracture with 20% height loss with no retropulsion.  Patient is able to move extremities but given the significant pain on ambulation patient admitted for further management.  TLSO brace was ordered.  Labs does not show anything acute with creatinine of 1.7 hemoglobin 12.7 platelets 419 INR 2.3.  Review of Systems: As per HPI, rest all negative.   Past Medical History:  Diagnosis Date  . Atrial fibrillation ([place])    SVT dx 2007, cath 2007 mild CAD, then had an cardioversion, ablation; still on coumadin , has occ palpitation, EKG 03-2010 NSR  . Blindness of left eye   . CKD (chronic kidney disease), stage III ([place]) 06/16/2016   [name] 06/17/2016  . DIABETES MELLITUS, TYPE II 03/27/2006   dr [name]  . Eye muscle weakness    Right eye weakness after cataract surgery  . GERD (gastroesophageal reflux disease) 10/05/2011  . 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 HYPERLIPIDEMIA 03/27/2006  . HYPERTENSION 03/27/2006  . Intertrochanteric fracture of right  hip ([place]) 07/13/2012  . 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 Osteopenia 2004   Dexa 2004 showed Osteopenia, DEXA 03/2007 normal  . Osteopenia   . Other chronic cystitis with hematuria   . Pelvic fracture ([place]) 06/16/2016   S/P fall  . Recurrent urinary tract infection    Seeing Urology  . 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 Seizures ([place])   . 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[place]) 9/56/2130    Past Surgical History:  Procedure Laterality Date  . ANKLE FRACTURE SURGERY Bilateral 08/2015   "right one was in 2 places"  . CARDIAC CATHETERIZATION N/A 05/17/2015   Procedure: Left Heart Cath and Coronary Angiography;  Surgeon: Jettie Booze, MD;  Location: [facility];  Service: Cardiovascular;  Laterality: N/A;  . CARDIAC CATHETERIZATION N/A 05/17/2015   Procedure: Coronary Balloon Angioplasty;  Surgeon: Jettie Booze, MD;  Location: [facility];  Service: Cardiovascular;  Laterality: N/A;  . FEMUR IM NAIL Right 07/15/2012   Procedure: INTRAMEDULLARY (IM) NAIL HIP;  Surgeon: Johnny Bridge, MD;  Location: [facility];  Service: Orthopedics;  Laterality: Right;  . FEMUR IM NAIL Left 12/02/2015   Procedure: INTRAMEDULLARY (IM) NAIL FEMORAL;  Surgeon: [name], MD;  Location: [facility];  Service: Orthopedics;  Laterality: Left;  . FRACTURE SURGERY    . INCISION AND DRAINAGE HIP Left 01/03/2016  Procedure: IRRIGATION AND DEBRIDEMENT HIP;  Surgeon: [name], MD;  Location: [facility];  Service: Orthopedics;  Laterality: Left;  . SHOULDER SURGERY Bilateral    "don't remember which side"     reports that she quit smoking about 40 years ago. Her smoking use included cigarettes. She has never used smokeless tobacco. She reports that she does not drink alcohol or use drugs.  Allergies  Allergen Reactions  . Procaine Hcl Anaphylaxis  . Amoxicillin Itching     Did it involve swelling of the face/tongue/throat, SOB, or low BP? No  Did it involve sudden or severe rash/hives, skin peeling, or any reaction on the inside of your mouth or nose? No Did you need to seek medical attention at a hospital or doctor's office? No When did it last happen?unk If all above answers are "NO", may proceed with cephalosporin use.     Family History  Problem Relation Age of Onset  . Diabetes Father   . Heart attack Father 67  . Diabetes Sister   . Drug abuse Son   . Cancer Neg Hx        no hx of colon or breast cancer    Prior to Admission medications   Medication Sig Start Date End Date Taking? Authorizing Provider  aspirin EC 81 MG tablet Take 1 tablet (81 mg total) by mouth daily. 04/12/18  Yes Kilroy, Luke K, PA-C  atorvastatin (LIPITOR) 80 MG tablet Take 1 tablet (80 mg total) by mouth daily. 01/21/18  Yes [name], Alda Berthold, MD  calcium-vitamin D (OSCAL WITH D) 500-200 MG-UNIT per tablet Take 1 tablet by mouth daily.   Yes [provider]  cephALEXin (KEFLEX) 500 MG capsule Take 1 capsule (500 mg total) by mouth 3 (three) times daily for 5 days. 07/15/18 07/20/18 Yes Duffy Bruce, MD  docusate sodium (COLACE) 100 MG capsule Take 1 capsule (100 mg total) by mouth daily for 7 days. While taking pain medications 07/15/18 07/22/18 Yes Duffy Bruce, MD  escitalopram (LEXAPRO) 10 MG tablet TAKE 2 TABLETS BY MOUTH ONCE DAILY Patient taking differently: Take 10 mg by mouth daily.  03/25/17  Yes Copland, Gay Filler, MD  furosemide (LASIX) 40 MG tablet Take 1 tablet (40 mg total) by mouth 2 (two) times daily as needed for fluid or edema. 07/29/17  Yes [name], Alda Berthold, MD  HYDROcodone-Acetaminophen 7.5-300 MG TABS Take 1 tablet by mouth 4 (four) times daily as needed. 07/18/18  Yes [name], Alda Berthold, MD  insulin NPH-regular Human (NOVOLIN 70/30 RELION) (70-30) 100 UNIT/ML injection 31 units with breakfast and 4 units with evening meal. Patient taking differently: Inject 4-31 Units into the skin See admin instructions. 31 units with breakfast and 4  units with evening meal. 07/12/18  Yes Renato Shin, MD  metoprolol succinate (TOPROL-XL) 25 MG 24 hr tablet Take 1 tablet (25 mg total) by mouth daily. Take with or immediately following a meal. 01/21/18  Yes [name], Alda Berthold, MD  Multiple Vitamins-Minerals (MULTIVITAMIN ADULT PO) Take 1 tablet by mouth daily with breakfast.    Yes [provider]  oxyCODONE-acetaminophen (PERCOCET/ROXICET) 5-325 MG tablet Take 1-2 tablets by mouth every 6 (six) hours as needed for moderate pain or severe pain. 07/15/18  Yes Duffy Bruce, MD  pantoprazole (PROTONIX) 40 MG tablet Take 1 tablet (40 mg total) by mouth daily. 10/08/16  Yes [name], Alda Berthold, MD  warfarin (COUMADIN) 2.5 MG tablet TAKE AS DIRECTED BY  COUMADIN  CLINIC Patient taking differently: Take 2.5  mg by mouth daily.  01/03/18  Yes Evans Lance, MD    Physical Exam: Vitals:   07/18/18 2100 07/18/18 2306 07/18/18 2310 07/18/18 2330  BP: (!) 109/49 (!) 146/60 136/77 (!) 140/50  Pulse: (!) 59 67 65 65  Resp: 17 18    Temp:      TempSrc:      SpO2: 94% 100% 99% 99%  Weight:          Constitutional: Moderately built and nourished. Vitals:   07/18/18 2100 07/18/18 2306 07/18/18 2310 07/18/18 2330  BP: (!) 109/49 (!) 146/60 136/77 (!) 140/50  Pulse: (!) 59 67 65 65  Resp: 17 18    Temp:      TempSrc:      SpO2: 94% 100% 99% 99%  Weight:       Eyes: Anicteric no pallor. ENMT: No discharge from the ears eyes nose or mouth. Neck: No mass felt.  No neck rigidity. Respiratory: No rhonchi or crepitations. Cardiovascular: S1-S2 heard. Abdomen: Soft nontender bowel sounds present. Musculoskeletal: No edema.  Joint effusion. Skin: No rash. Neurologic: Alert awake oriented to time place and person.  Moves all extremities. Psychiatric: Appears normal per normal affect.   Labs on Admission: I have personally reviewed following labs and imaging studies  CBC: Recent Labs  Lab 07/15/18 1552 07/18/18 1850  WBC 10.4 10.3  NEUTROABS 5.9  6.0  HGB 12.7 12.7  HCT 39.1 38.7  MCV 93.1 93.7  PLT 396 812*   Basic Metabolic Panel: Recent Labs  Lab 07/15/18 1552 07/18/18 1850  NA 144 138  K 3.4* 3.8  CL 106 100  CO2 28 25  GLUCOSE 49* 297*  BUN 21 36*  CREATININE 1.66* 1.72*  CALCIUM 9.8 9.1   GFR: Estimated Creatinine Clearance: 30.8 mL/min (A) (by C-G formula based on SCr of 1.72 mg/dL (H)). Liver Function Tests: No results for input(s): AST, ALT, ALKPHOS, BILITOT, PROT, ALBUMIN in the last 168 hours. No results for input(s): LIPASE, AMYLASE in the last 168 hours. No results for input(s): AMMONIA in the last 168 hours. Coagulation Profile: Recent Labs  Lab 07/15/18 1552  INR 1.9*   Cardiac Enzymes: No results for input(s): CKTOTAL, CKMB, CKMBINDEX, TROPONINI in the last 168 hours. BNP (last 3 results) No results for input(s): PROBNP in the last 8760 hours. HbA1C: No results for input(s): HGBA1C in the last 72 hours. CBG: No results for input(s): GLUCAP in the last 168 hours. Lipid Profile: No results for input(s): CHOL, HDL, LDLCALC, TRIG, CHOLHDL, LDLDIRECT in the last 72 hours. Thyroid Function Tests: No results for input(s): TSH, T4TOTAL, FREET4, T3FREE, THYROIDAB in the last 72 hours. Anemia Panel: No results for input(s): VITAMINB12, FOLATE, FERRITIN, TIBC, IRON, RETICCTPCT in the last 72 hours. Urine analysis:    Component Value Date/Time   COLORURINE YELLOW 07/15/2018 1326   APPEARANCEUR HAZY (A) 07/15/2018 1326   LABSPEC 1.006 07/15/2018 1326   PHURINE 7.0 07/15/2018 1326   [place] 07/15/2018 1326   [place] 10/15/2016 0824   HGBUR NEGATIVE 07/15/2018 1326   [place] 07/15/2018 1326   BILIRUBINUR neg 05/16/2015 1928   KETONESUR NEGATIVE 07/15/2018 1326   PROTEINUR NEGATIVE 07/15/2018 1326   UROBILINOGEN 0.2 10/15/2016 0824   NITRITE NEGATIVE 07/15/2018 1326   LEUKOCYTESUR LARGE (A) 07/15/2018 1326   Sepsis Labs: @LABRCNTIP (procalcitonin:4,lacticidven:4)  ) Recent Results (from the past 240 hour(s))  Urine culture     Status: Abnormal (Preliminary result)   Collection Time: 07/15/18  4:19  PM  Result Value Ref Range Status   Specimen Description URINE, RANDOM  Final   Special Requests NONE  Final   Culture (A)  Final    >=100,000 COLONIES/mL KLEBSIELLA PNEUMONIAE >=100,000 COLONIES/mL VIRIDANS STREPTOCOCCUS SUSCEPTIBILITIES TO FOLLOW Performed at [facility], [address], [place], [place] [address]    Report Status PENDING  Incomplete     Radiological Exams on Admission: Mr Lumbar Spine Wo Contrast  Result Date: 07/18/2018 CLINICAL DATA:  Severe low back pain. EXAM: MRI LUMBAR SPINE WITHOUT CONTRAST TECHNIQUE: Multiplanar, multisequence MR imaging of the lumbar spine was performed. No intravenous contrast was administered. COMPARISON:  CT lumbar spine dated Jul 15, 2018. CT abdomen pelvis dated May 20, 2015. FINDINGS: Segmentation:  Standard. Alignment: Unchanged trace retrolisthesis at L3-L4. Healed S1 fracture with residual 6 mm anterolisthesis at S1-S2. Vertebrae: Mild acute to subacute L1 inferior endplate compression fracture with 20% height loss. No retropulsion. No evidence of discitis or focal bone lesion. Conus medullaris and cauda equina: Conus extends to the L1-L2 level. Conus and cauda equina appear normal. Paraspinal and other soft tissues: Bilateral renal atrophy and cysts. Disc levels: T12-L1:  Minimal disc bulging.  No stenosis. L1-L2: Minimal disc bulging. Prominent ligamentum flavum hypertrophy. Mild-to-moderate spinal canal stenosis. No neuroforaminal stenosis. L2-L3: Circumferential disc osteophyte complex. Mild bilateral facet arthropathy and ligamentum flavum hypertrophy. Severe spinal canal and lateral recess stenosis. No neuroforaminal stenosis. L3-L4: Circumferential disc bulging posterior right-sided disc osteophyte complex. Mild bilateral facet arthropathy with ligamentum flavum hypertrophy. Severe spinal  canal and lateral recess stenosis. Moderate right and mild left neuroforaminal stenosis. L4-L5: Mild disc bulging with superimposed tiny central disc protrusion. Mild bilateral facet arthropathy. Mild moderate spinal canal stenosis. Severe left and moderate right lateral recess stenosis. Moderate to severe left and mild right neuroforaminal stenosis. L5-S1: Small central disc protrusion and annular fissure. Severe left and mild right facet arthropathy. Severe left and mild right neuroforaminal stenosis. No spinal canal stenosis. IMPRESSION: 1. Acute to subacute mild L1 inferior endplate compression fracture with 20% height loss. No retropulsion. 2. Advanced multilevel lumbar spondylosis as described above. Severe spinal canal stenosis at L2-L3 and L3-L4. Severe left neuroforaminal stenosis at L4-L5 and L5-S1. Electronically Signed   By: Titus Dubin M.D.   On: 07/18/2018 22:19     Assessment/Plan Principal Problem:   Closed L1 vertebral fracture (HCC) Active Problems:   Essential hypertension   Atrial fibrillation (HCC)   Seizure disorder (HCC)   CAD S/P percutaneous coronary angioplasty   CKD (chronic kidney disease) stage 3, GFR 30-59 ml/min (HCC)    1. L1 vertebral fracture -at this time I have placed patient on pain relief medication patient is also ordered TLSO brace.  May discuss with neurosurgeon in the morning.  PRN morphine for pain. 2. History of atrial fibrillation status post ablation on Coumadin.  I have held patient's Coumadin kept patient on heparin until we discussed with neurosurgeon to see if there are any procedure anticipated. 3. CAD status post stenting denies any chest pain.  On aspirin Lipitor metoprolol. 4. History of cardiomyopathy presently appears compensated takes Lasix as needed.  Last 2D echo done in 2017 showed 35 to 40% EF. 5. Chronic kidney disease stage III creatinine appears to be at baseline. 6. Recently treated for UTI on Keflex UA is pending. 7. Diabetes  mellitus type 2 on long-acting insulin NovoLog 70/30 31 dose in the morning 4 units in evening.   DVT prophylaxis: Heparin/Coumadin. Code Status: Full code.  Family Communication: Discussed with patient. Disposition Plan: To be determined. Consults called: None. Admission status: Observation.   Rise Patience MD [facility] Pager [phone].  If 7PM-7AM, please contact night-coverage [email or web address] Password [email or web address]  07/19/2018, 12:02 AM

## 2018-07-19 NOTE — Progress Notes (Signed)
ANTICOAGULATION CONSULT NOTE - Initial Consult  Pharmacy Consult for coumadin Indication: atrial fibrillation  Allergies  Allergen Reactions  . Procaine Hcl Anaphylaxis  . Amoxicillin Itching     Did it involve swelling of the face/tongue/throat, SOB, or low BP? No Did it involve sudden or severe rash/hives, skin peeling, or any reaction on the inside of your mouth or nose? No Did you need to seek medical attention at a hospital or doctor's office? No When did it last happen?unk If all above answers are "NO", may proceed with cephalosporin use.     Patient Measurements: Height: 5\' 9"  (175.3 cm) Weight: 183 lb 6.8 oz (83.2 kg) IBW/kg (Calculated) : 66.2  Vital Signs: Temp: 98 F (36.7 C) (05/19 1158) Temp Source: Oral (05/19 0627) BP: 108/48 (05/19 1158) Pulse Rate: 68 (05/19 1158)  Labs: Recent Labs    07/18/18 1850 07/19/18 0043 07/19/18 0514  HGB 12.7  --  12.2  HCT 38.7  --  38.0  PLT 419*  --  399  LABPROT  --  25.4*  --   INR  --  2.3*  --   CREATININE 1.72*  --  1.90*    Estimated Creatinine Clearance: 29 mL/min (A) (by C-G formula based on SCr of 1.9 mg/dL (H)).   Medical History: Past Medical History:  Diagnosis Date  . Atrial fibrillation ([place])    SVT dx 2007, cath 2007 mild CAD, then had an cardioversion, ablation; still on coumadin , has occ palpitation, EKG 03-2010 NSR  . Blindness of left eye   . CKD (chronic kidney disease), stage III ([place]) 06/16/2016   [name] 06/17/2016  . DIABETES MELLITUS, TYPE II 03/27/2006   dr [name]  . Eye muscle weakness    Right eye weakness after cataract surgery  . GERD (gastroesophageal reflux disease) 10/05/2011  . 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 HYPERLIPIDEMIA 03/27/2006  . HYPERTENSION 03/27/2006  . Intertrochanteric fracture of right hip ([place]) 07/13/2012  . 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 Osteopenia 2004   Dexa 2004 showed Osteopenia, DEXA 03/2007  normal  . Osteopenia   . Other chronic cystitis with hematuria   . Pelvic fracture ([place]) 06/16/2016   S/P fall  . Recurrent urinary tract infection    Seeing Urology  . 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 Seizures ([place])   . 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[place]) 04/03/5425    Medications:  Medications Prior to Admission  Medication Sig Dispense Refill Last Dose  . aspirin EC 81 MG tablet Take 1 tablet (81 mg total) by mouth daily. 30 tablet 3 07/17/2018 at Unknown time  . atorvastatin (LIPITOR) 80 MG tablet Take 1 tablet (80 mg total) by mouth daily. 90 tablet 1 07/17/2018 at Unknown time  . calcium-vitamin D (OSCAL WITH D) 500-200 MG-UNIT per tablet Take 1 tablet by mouth daily.   07/17/2018 at Unknown time  . cephALEXin (KEFLEX) 500 MG capsule Take 1 capsule (500 mg total) by mouth 3 (three) times daily for 5 days. 15 capsule 0 07/18/2018 at 1530  . docusate sodium (COLACE) 100 MG capsule Take 1 capsule (100 mg total) by mouth daily for 7 days. While taking pain medications 7 capsule 0 07/17/2018 at Unknown time  . escitalopram (LEXAPRO) 10 MG tablet TAKE 2 TABLETS BY MOUTH ONCE DAILY (Patient taking differently: Take 10 mg by mouth daily. ) 180 tablet 1 07/17/2018 at Unknown time  . furosemide (LASIX)  40 MG tablet Take 1 tablet (40 mg total) by mouth 2 (two) times daily as needed for fluid or edema. 180 tablet 1 unk  . HYDROcodone-Acetaminophen 7.5-300 MG TABS Take 1 tablet by mouth 4 (four) times daily as needed. 28 each 0 07/18/2018 at 1530  . insulin NPH-regular Human (NOVOLIN 70/30 RELION) (70-30) 100 UNIT/ML injection 31 units with breakfast and 4 units with evening meal. (Patient taking differently: Inject 4-31 Units into the skin See admin instructions. 31 units with breakfast and 4 units with evening meal.) 20 mL 11 07/17/2018 at Unknown time  . metoprolol succinate (TOPROL-XL) 25 MG 24 hr tablet Take 1 tablet (25 mg total) by mouth daily. Take with or immediately following a meal. 90 tablet 1 07/17/2018  at 2100  . Multiple Vitamins-Minerals (MULTIVITAMIN ADULT PO) Take 1 tablet by mouth daily with breakfast.    07/17/2018 at Unknown time  . oxyCODONE-acetaminophen (PERCOCET/ROXICET) 5-325 MG tablet Take 1-2 tablets by mouth every 6 (six) hours as needed for moderate pain or severe pain. 16 tablet 0 07/18/2018 at 1530  . pantoprazole (PROTONIX) 40 MG tablet Take 1 tablet (40 mg total) by mouth daily. 90 tablet 3 07/18/2018 at Unknown time  . warfarin (COUMADIN) 2.5 MG tablet TAKE AS DIRECTED BY  COUMADIN  CLINIC (Patient taking differently: Take 2.5 mg by mouth daily. ) 120 tablet 1 07/17/2018 at 2100    Assessment: [name] on coumadin PTA for afib.  Pharmacy consulted to dose coumadin.  Home dose is 2.5 mg daily -last dose 5/17 at 2100.  Dose held 5/18 pending neurosurgeon consult.  No intervention planned.  Admission INR therapeutic at 2.3. CBC WNL, no bleeding reported.   Goal of Therapy:  INR 2-3 Monitor platelets by anticoagulation protocol: Yes   Plan:  Resume home dose of coumadin 2.5 mg daily Daily INR  Eudelia Bunch, Pharm.D 07/19/2018 1:33 PM

## 2018-07-19 NOTE — Progress Notes (Signed)
PROGRESS NOTE    [name] [name]  EXB:[id number] DOB: Oct 12, 1940 DOA: 07/18/2018 PCP: Colon Branch, MD   Brief Narrative:  [name] [name] is a 78 year old female with history of type 2 diabetes mellitus, CAD status post angioplasty, ischemic cardiomyopathy with atrial fibrillation status post ablation, blindness of left eye, chronic kidney disease stage III who initially came to ER 3 days ago with the back pain and she was diagnosed with UTI and sent home with oral antibiotics.  She took antibiotics but continued to have pain so she returned to the emergency department early this morning.  Further work-up included MRI of the lumbar spine which diagnosed her with L1 compression fracture with 20% height loss.  She was subsequently admitted under hospitalist service.  TLSO brace was ordered by admitting physician.  Consultants:   Neurosurgery  Procedures:   None  Antimicrobials:   None   Subjective: Pain seen and examined.  She still complains of low back pain but it is tolerable.  No other complaint.  Objective: Vitals:   07/19/18 0050 07/19/18 0627 07/19/18 0923 07/19/18 1158  BP: (!) 142/46 (!) 155/56 (!) 161/63 (!) 108/48  Pulse:  67 71 68  Resp:  14 16 15   Temp:  98 F (36.7 C)  98 F (36.7 C)  TempSrc:  Oral    SpO2:  98% 93% 95%  Weight:      Height:        Intake/Output Summary (Last 24 hours) at 07/19/2018 1311 Last data filed at 07/19/2018 1100 Gross per 24 hour  Intake 240 ml  Output 275 ml  Net -35 ml   Filed Weights   07/18/18 1731 07/19/18 0034  Weight: 86.2 kg 83.2 kg    Examination:  General exam: Appears calm and comfortable  Respiratory system: Clear to auscultation. Respiratory effort normal. Cardiovascular system: S1 & S2 heard, RRR. No JVD, murmurs, rubs, gallops or clicks. No pedal edema. Gastrointestinal system: Abdomen is nondistended, soft and nontender. No organomegaly or masses felt. Normal bowel sounds heard. Central nervous  system: Alert and oriented. No focal neurological deficits. Extremities: Symmetric 5 x 5 power.  Has very mild point tenderness at the midline lumbar area. Skin: No rashes, lesions or ulcers Psychiatry: Judgement and insight appear normal. Mood & affect appropriate.    Data Reviewed: I have personally reviewed following labs and imaging studies  CBC: Recent Labs  Lab 07/15/18 1552 07/18/18 1850 07/19/18 0514  WBC 10.4 10.3 7.9  NEUTROABS 5.9 6.0  --   HGB 12.7 12.7 12.2  HCT 39.1 38.7 38.0  MCV 93.1 93.7 94.5  PLT 396 419* 102   Basic Metabolic Panel: Recent Labs  Lab 07/15/18 1552 07/18/18 1850 07/19/18 0514  NA 144 138 138  K 3.4* 3.8 4.6  CL 106 100 100  CO2 28 25 28   GLUCOSE 49* 297* 293*  BUN 21 36* 41*  CREATININE 1.66* 1.72* 1.90*  CALCIUM 9.8 9.1 9.0   GFR: Estimated Creatinine Clearance: 29 mL/min (A) (by C-G formula based on SCr of 1.9 mg/dL (H)). Liver Function Tests: No results for input(s): AST, ALT, ALKPHOS, BILITOT, PROT, ALBUMIN in the last 168 hours. No results for input(s): LIPASE, AMYLASE in the last 168 hours. No results for input(s): AMMONIA in the last 168 hours. Coagulation Profile: Recent Labs  Lab 07/15/18 1552 07/19/18 0043  INR 1.9* 2.3*   Cardiac Enzymes: No results for input(s): CKTOTAL, CKMB, CKMBINDEX, TROPONINI in the last 168 hours. BNP (last 3 results) No  results for input(s): PROBNP in the last 8760 hours. HbA1C: No results for input(s): HGBA1C in the last 72 hours. CBG: Recent Labs  Lab 07/19/18 0025 07/19/18 0736 07/19/18 1154  GLUCAP 282* 242* 180*   Lipid Profile: No results for input(s): CHOL, HDL, LDLCALC, TRIG, CHOLHDL, LDLDIRECT in the last 72 hours. Thyroid Function Tests: No results for input(s): TSH, T4TOTAL, FREET4, T3FREE, THYROIDAB in the last 72 hours. Anemia Panel: No results for input(s): VITAMINB12, FOLATE, FERRITIN, TIBC, IRON, RETICCTPCT in the last 72 hours. Sepsis Labs: No results for  input(s): PROCALCITON, LATICACIDVEN in the last 168 hours.  Recent Results (from the past 240 hour(s))  Urine culture     Status: Abnormal   Collection Time: 07/15/18  4:19 PM  Result Value Ref Range Status   Specimen Description URINE, RANDOM  Final   Special Requests   Final    NONE Performed at [facility], [address] [place], [place] [address]    Culture (A)  Final    >=100,000 COLONIES/mL KLEBSIELLA PNEUMONIAE Confirmed Extended Spectrum Beta-Lactamase Producer (ESBL).  In bloodstream infections from ESBL organisms, carbapenems are preferred over piperacillin/tazobactam. They are shown to have a lower risk of mortality. >=100,000 COLONIES/mL VIRIDANS STREPTOCOCCUS    Report Status 07/19/2018 FINAL  Final   Organism ID, Bacteria KLEBSIELLA PNEUMONIAE (A)  Final      Susceptibility   Klebsiella pneumoniae - MIC*    AMPICILLIN >=32 RESISTANT Resistant     CEFAZOLIN >=64 RESISTANT Resistant     CEFTRIAXONE >=64 RESISTANT Resistant     CIPROFLOXACIN >=4 RESISTANT Resistant     GENTAMICIN >=16 RESISTANT Resistant     IMIPENEM 0.5 SENSITIVE Sensitive     NITROFURANTOIN 256 RESISTANT Resistant     TRIMETH/SULFA 80 RESISTANT Resistant     AMPICILLIN/SULBACTAM >=32 RESISTANT Resistant     PIP/TAZO <=4 SENSITIVE Sensitive     Extended ESBL POSITIVE Resistant     * >=100,000 COLONIES/mL KLEBSIELLA PNEUMONIAE  SARS Coronavirus 2 (CEPHEID - Performed in [place] hospital lab), Hosp Order     Status: None   Collection Time: 07/18/18 11:14 PM  Result Value Ref Range Status   SARS Coronavirus 2 NEGATIVE NEGATIVE Final    Comment: (NOTE) If result is NEGATIVE SARS-CoV-2 target nucleic acids are NOT DETECTED. The SARS-CoV-2 RNA is generally detectable in upper and lower  respiratory specimens during the acute phase of infection. The lowest  concentration of SARS-CoV-2 viral copies this assay can detect is 250  copies / mL. A negative result does not preclude SARS-CoV-2  infection  and should not be used as the sole basis for treatment or other  patient management decisions.  A negative result may occur with  improper specimen collection / handling, submission of specimen other  than nasopharyngeal swab, presence of viral mutation(s) within the  areas targeted by this assay, and inadequate number of viral copies  (<250 copies / mL). A negative result must be combined with clinical  observations, patient history, and epidemiological information. If result is POSITIVE SARS-CoV-2 target nucleic acids are DETECTED. The SARS-CoV-2 RNA is generally detectable in upper and lower  respiratory specimens dur ing the acute phase of infection.  Positive  results are indicative of active infection with SARS-CoV-2.  Clinical  correlation with patient history and other diagnostic information is  necessary to determine patient infection status.  Positive results do  not rule out bacterial infection or co-infection with other viruses. If result is PRESUMPTIVE POSTIVE SARS-CoV-2  nucleic acids MAY BE PRESENT.   A presumptive positive result was obtained on the submitted specimen  and confirmed on repeat testing.  While 2019 novel coronavirus  (SARS-CoV-2) nucleic acids may be present in the submitted sample  additional confirmatory testing may be necessary for epidemiological  and / or clinical management purposes  to differentiate between  SARS-CoV-2 and other Sarbecovirus currently known to infect humans.  If clinically indicated additional testing with an alternate test  methodology [phone]) is advised. The SARS-CoV-2 RNA is generally  detectable in upper and lower respiratory sp ecimens during the acute  phase of infection. The expected result is Negative. Fact Sheet for Patients:  StrictlyIdeas.no Fact Sheet for Healthcare Providers: BankingDealers.co.za This test is not yet approved or cleared by the [place]  FDA and has been authorized for detection and/or diagnosis of SARS-CoV-2 by FDA under an Emergency Use Authorization (EUA).  This EUA will remain in effect (meaning this test can be used) for the duration of the COVID-19 declaration under Section 564(b)(1) of the Act, 21 U.S.C. section 360bbb-3(b)(1), unless the authorization is terminated or revoked sooner. Performed at [facility] [place] [address], [place], [place] [address]   MRSA PCR Screening     Status: None   Collection Time: 07/19/18  1:04 AM  Result Value Ref Range Status   MRSA by PCR NEGATIVE NEGATIVE Final    Comment:        The GeneXpert MRSA Assay (FDA approved for NASAL specimens only), is one component of a comprehensive MRSA colonization surveillance program. It is not intended to diagnose MRSA infection nor to guide or monitor treatment for MRSA infections. Performed at [facility], [place] [address] [place],  [address]       Radiology Studies: Mr Lumbar Spine Wo Contrast  Result Date: 07/18/2018 CLINICAL DATA:  Severe low back pain. EXAM: MRI LUMBAR SPINE WITHOUT CONTRAST TECHNIQUE: Multiplanar, multisequence MR imaging of the lumbar spine was performed. No intravenous contrast was administered. COMPARISON:  CT lumbar spine dated Jul 15, 2018. CT abdomen pelvis dated May 20, 2015. FINDINGS: Segmentation:  Standard. Alignment: Unchanged trace retrolisthesis at L3-L4. Healed S1 fracture with residual 6 mm anterolisthesis at S1-S2. Vertebrae: Mild acute to subacute L1 inferior endplate compression fracture with 20% height loss. No retropulsion. No evidence of discitis or focal bone lesion. Conus medullaris and cauda equina: Conus extends to the L1-L2 level. Conus and cauda equina appear normal. Paraspinal and other soft tissues: Bilateral renal atrophy and cysts. Disc levels: T12-L1:  Minimal disc bulging.  No stenosis. L1-L2: Minimal disc bulging. Prominent ligamentum  flavum hypertrophy. Mild-to-moderate spinal canal stenosis. No neuroforaminal stenosis. L2-L3: Circumferential disc osteophyte complex. Mild bilateral facet arthropathy and ligamentum flavum hypertrophy. Severe spinal canal and lateral recess stenosis. No neuroforaminal stenosis. L3-L4: Circumferential disc bulging posterior right-sided disc osteophyte complex. Mild bilateral facet arthropathy with ligamentum flavum hypertrophy. Severe spinal canal and lateral recess stenosis. Moderate right and mild left neuroforaminal stenosis. L4-L5: Mild disc bulging with superimposed tiny central disc protrusion. Mild bilateral facet arthropathy. Mild moderate spinal canal stenosis. Severe left and moderate right lateral recess stenosis. Moderate to severe left and mild right neuroforaminal stenosis. L5-S1: Small central disc protrusion and annular fissure. Severe left and mild right facet arthropathy. Severe left and mild right neuroforaminal stenosis. No spinal canal stenosis. IMPRESSION: 1. Acute to subacute mild L1 inferior endplate compression fracture with 20% height loss. No retropulsion. 2. Advanced multilevel lumbar spondylosis as described above. Severe  spinal canal stenosis at L2-L3 and L3-L4. Severe left neuroforaminal stenosis at L4-L5 and L5-S1. Electronically Signed   By: Titus Dubin M.D.   On: 07/18/2018 22:19    Scheduled Meds:  aspirin EC  81 mg Oral Daily   atorvastatin  80 mg Oral Daily   calcium-vitamin D  1 tablet Oral Daily   docusate sodium  100 mg Oral Daily   escitalopram  10 mg Oral Daily   insulin aspart  0-9 Units Subcutaneous TID WC   insulin aspart protamine- aspart  31 Units Subcutaneous Q breakfast   insulin aspart protamine- aspart  4 Units Subcutaneous Q supper   lidocaine  1 patch Transdermal Q24H   metoprolol succinate  25 mg Oral Daily   pantoprazole  40 mg Oral Daily   Continuous Infusions:   LOS: 0 days   Assessment & Plan:   Principal Problem:    Closed L1 vertebral fracture (HCC) Active Problems:   Essential hypertension   Atrial fibrillation (HCC)   Seizure disorder (HCC)   CAD S/P percutaneous coronary angioplasty   CKD (chronic kidney disease) stage 3, GFR 30-59 ml/min (HCC)   L1 vertebral compression fracture: I discussed the case with Dr. Trenton Gammon from neurosurgery who had reviewed patient's imaging study remotely and recommended TLSO brace.  She did not think that patient needs any intervention at this point in time.  He recommended that once patient has TLSO brace, she should be seen by PT OT and if she continues to have unbearable pain up until 2 to 3 weeks then she might need some intervention.  Waiting for TLSO brace and then will consult PT OT.  Paroxysmal atrial fibrillation on Coumadin: Rate is controlled.  Continue current medications.  Coumadin was held due to potential need of intervention but now that there is no interventions will consult pharmacy to resume Coumadin.  CAD: Symptoms.  Resume home medications.  Chronic kidney disease stage III: At baseline.  Continue to watch.  Recent UTI: Continue Keflex.  Type 2 diabetes mellitus: Sugars slightly elevated but at this point in time I will continue her home regimen along with sliding scale insulin.  DVT prophylaxis: coumadin Code Status: full code Family Communication: discussed with pt. Disposition Plan: likely dc home tomorrow.    Time spent: 30 min    Darliss Cheney, MD [facility] Pager [phone]  If 7PM-7AM, please contact night-coverage [email or web address] Password [email or web address] 07/19/2018, 1:11 PM

## 2018-07-19 NOTE — Progress Notes (Signed)
ANTICOAGULATION CONSULT NOTE - Initial Consult  Pharmacy Consult for Heparin (when INR < 2) Indication: atrial fibrillation (warfarin bridging)  Allergies  Allergen Reactions  . Procaine Hcl Anaphylaxis  . Amoxicillin Itching     Did it involve swelling of the face/tongue/throat, SOB, or low BP? No Did it involve sudden or severe rash/hives, skin peeling, or any reaction on the inside of your mouth or nose? No Did you need to seek medical attention at a hospital or doctor's office? No When did it last happen?unk If all above answers are "NO", may proceed with cephalosporin use.     Patient Measurements: Height: 5\' 9"  (175.3 cm) Weight: 183 lb 6.8 oz (83.2 kg) IBW/kg (Calculated) : 66.2 Heparin Dosing Weight:   Vital Signs: Temp: 97.4 F (36.3 C) (05/19 0017) Temp Source: Oral (05/18 1738) BP: 142/46 (05/19 0050) Pulse Rate: 67 (05/19 0017)  Labs: Recent Labs    07/18/18 1850 07/19/18 0043  HGB 12.7  --   HCT 38.7  --   PLT 419*  --   LABPROT  --  25.4*  INR  --  2.3*  CREATININE 1.72*  --     Estimated Creatinine Clearance: 32.1 mL/min (A) (by C-G formula based on SCr of 1.72 mg/dL (H)).   Medical History: Past Medical History:  Diagnosis Date  . Atrial fibrillation ([place])    SVT dx 2007, cath 2007 mild CAD, then had an cardioversion, ablation; still on coumadin , has occ palpitation, EKG 03-2010 NSR  . Blindness of left eye   . CKD (chronic kidney disease), stage III ([place]) 06/16/2016   [name] 06/17/2016  . DIABETES MELLITUS, TYPE II 03/27/2006   dr [name]  . Eye muscle weakness    Right eye weakness after cataract surgery  . GERD (gastroesophageal reflux disease) 10/05/2011  . 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 HYPERLIPIDEMIA 03/27/2006  . HYPERTENSION 03/27/2006  . Intertrochanteric fracture of right hip ([place]) 07/13/2012  . 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 Osteopenia 2004   Dexa 2004 showed  Osteopenia, DEXA 03/2007 normal  . Osteopenia   . Other chronic cystitis with hematuria   . Pelvic fracture ([place]) 06/16/2016   S/P fall  . Recurrent urinary tract infection    Seeing Urology  . 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 Seizures ([place])   . 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[place]) 0/96/0454    Medications:  Medications Prior to Admission  Medication Sig Dispense Refill Last Dose  . aspirin EC 81 MG tablet Take 1 tablet (81 mg total) by mouth daily. 30 tablet 3 07/17/2018 at Unknown time  . atorvastatin (LIPITOR) 80 MG tablet Take 1 tablet (80 mg total) by mouth daily. 90 tablet 1 07/17/2018 at Unknown time  . calcium-vitamin D (OSCAL WITH D) 500-200 MG-UNIT per tablet Take 1 tablet by mouth daily.   07/17/2018 at Unknown time  . cephALEXin (KEFLEX) 500 MG capsule Take 1 capsule (500 mg total) by mouth 3 (three) times daily for 5 days. 15 capsule 0 07/18/2018 at 1530  . docusate sodium (COLACE) 100 MG capsule Take 1 capsule (100 mg total) by mouth daily for 7 days. While taking pain medications 7 capsule 0 07/17/2018 at Unknown time  . escitalopram (LEXAPRO) 10 MG tablet TAKE 2 TABLETS BY MOUTH ONCE DAILY (Patient taking differently: Take 10 mg by mouth daily. ) 180 tablet 1 07/17/2018 at Unknown time  . furosemide (LASIX) 40 MG  tablet Take 1 tablet (40 mg total) by mouth 2 (two) times daily as needed for fluid or edema. 180 tablet 1 unk  . HYDROcodone-Acetaminophen 7.5-300 MG TABS Take 1 tablet by mouth 4 (four) times daily as needed. 28 each 0 07/18/2018 at 1530  . insulin NPH-regular Human (NOVOLIN 70/30 RELION) (70-30) 100 UNIT/ML injection 31 units with breakfast and 4 units with evening meal. (Patient taking differently: Inject 4-31 Units into the skin See admin instructions. 31 units with breakfast and 4 units with evening meal.) 20 mL 11 07/17/2018 at Unknown time  . metoprolol succinate (TOPROL-XL) 25 MG 24 hr tablet Take 1 tablet (25 mg total) by mouth daily. Take with or immediately following a meal.  90 tablet 1 07/17/2018 at 2100  . Multiple Vitamins-Minerals (MULTIVITAMIN ADULT PO) Take 1 tablet by mouth daily with breakfast.    07/17/2018 at Unknown time  . oxyCODONE-acetaminophen (PERCOCET/ROXICET) 5-325 MG tablet Take 1-2 tablets by mouth every 6 (six) hours as needed for moderate pain or severe pain. 16 tablet 0 07/18/2018 at 1530  . pantoprazole (PROTONIX) 40 MG tablet Take 1 tablet (40 mg total) by mouth daily. 90 tablet 3 07/18/2018 at Unknown time  . warfarin (COUMADIN) 2.5 MG tablet TAKE AS DIRECTED BY  COUMADIN  CLINIC (Patient taking differently: Take 2.5 mg by mouth daily. ) 120 tablet 1 07/17/2018 at 2100   Scheduled:  . aspirin EC  81 mg Oral Daily  . atorvastatin  80 mg Oral Daily  . calcium-vitamin D  1 tablet Oral Daily  . docusate sodium  100 mg Oral Daily  . escitalopram  10 mg Oral Daily  . insulin aspart  0-9 Units Subcutaneous TID WC  . insulin aspart protamine- aspart  31 Units Subcutaneous Q breakfast  . insulin aspart protamine- aspart  4 Units Subcutaneous Q supper  . lidocaine  1 patch Transdermal Q24H  . metoprolol succinate  25 mg Oral Daily  . pantoprazole  40 mg Oral Daily   Infusions:    Assessment: Patient with afib in hospital with INR 2.3 on admit.  MD wants heparin to start when INR < 2.    Goal of Therapy:  Heparin level 0.3-0.7 units/ml Monitor platelets by anticoagulation protocol: Yes   Plan:  Recheck INR at 1800 No heparin at this time.  Tyler Deis, Shea Stakes [place] 07/19/2018,5:30 AM

## 2018-07-20 DIAGNOSIS — N183 Chronic kidney disease, stage 3 (moderate): Secondary | ICD-10-CM

## 2018-07-20 DIAGNOSIS — I10 Essential (primary) hypertension: Secondary | ICD-10-CM

## 2018-07-20 DIAGNOSIS — Z9861 Coronary angioplasty status: Secondary | ICD-10-CM

## 2018-07-20 DIAGNOSIS — S32010S Wedge compression fracture of first lumbar vertebra, sequela: Secondary | ICD-10-CM

## 2018-07-20 DIAGNOSIS — I2510 Atherosclerotic heart disease of native coronary artery without angina pectoris: Secondary | ICD-10-CM

## 2018-07-20 DIAGNOSIS — G40909 Epilepsy, unspecified, not intractable, without status epilepticus: Secondary | ICD-10-CM

## 2018-07-20 LAB — BASIC METABOLIC PANEL
Anion gap: 12 (ref 5–15)
BUN: 48 mg/dL — ABNORMAL HIGH (ref 8–23)
CO2: 24 mmol/L (ref 22–32)
Calcium: 9.1 mg/dL (ref 8.9–10.3)
Chloride: 102 mmol/L (ref 98–111)
Creatinine, Ser: 2.11 mg/dL — ABNORMAL HIGH (ref 0.44–1.00)
GFR calc Af Amer: 26 mL/min — ABNORMAL LOW (ref >60)
GFR calc non Af Amer: 22 mL/min — ABNORMAL LOW (ref >60)
Glucose, Bld: 121 mg/dL — ABNORMAL HIGH (ref 70–99)
Potassium: 4 mmol/L (ref 3.5–5.1)
Sodium: 138 mmol/L (ref 135–145)

## 2018-07-20 LAB — URINALYSIS, ROUTINE W REFLEX MICROSCOPIC
Bilirubin Urine: NEGATIVE
Glucose, UA: NEGATIVE mg/dL
Hgb urine dipstick: NEGATIVE
Ketones, ur: NEGATIVE mg/dL
Nitrite: NEGATIVE
Protein, ur: NEGATIVE mg/dL
Specific Gravity, Urine: 1.013 (ref 1.005–1.030)
WBC, UA: >50 WBC/hpf — ABNORMAL HIGH (ref 0–5)
pH: 7 (ref 5.0–8.0)

## 2018-07-20 LAB — GLUCOSE, CAPILLARY
Glucose-Capillary: 137 mg/dL — ABNORMAL HIGH (ref 70–99)
Glucose-Capillary: 97 mg/dL (ref 70–99)

## 2018-07-20 LAB — PROTIME-INR
INR: 2 — ABNORMAL HIGH (ref 0.8–1.2)
Prothrombin Time: 22.6 seconds — ABNORMAL HIGH (ref 11.4–15.2)

## 2018-07-20 MED ORDER — CALCITONIN (SALMON) 200 UNIT/ACT NA SOLN: 1.0000 | Freq: Every day | NASAL | 0 refills | Status: AC

## 2018-07-20 MED ORDER — HYDROCODONE-ACETAMINOPHEN 7.5-300 MG PO TABS: 0.5000 | ORAL_TABLET | Freq: Four times a day (QID) | ORAL | 0 refills | Status: DC | PRN

## 2018-07-20 NOTE — Evaluation (Signed)
Occupational Therapy Evaluation Patient Details Name: [name] [name] MRN: [id number] DOB: 10/14/1940 Today's Date: 07/20/2018    History of Present Illness 78 yo female admitted with L1 vertebral fx. Hx of R hip fx s/p IM nail 2014, L femur fx s/p IM nail 2017, blindness L eye, DM, A fib   Clinical Impression   Pt was admitted for the above.  She plans d/c home today.  A 52 year old great granddaughter lives with her and can assist. Husband is also at home. Pt was able to manage adls (sponge bathing) prior to admission but had help with walking to bathroom. Pt limited by c/o lightheadedness today. Recommend 3:1 commode and HHOT     Follow Up Recommendations  Supervision/Assistance - 24 hour;Home health OT    Equipment Recommendations  3 in 1 bedside commode    Recommendations for Other Services       Precautions / Restrictions Precautions Precautions: Fall;Back Precaution Comments: logroll Required Braces or Orthoses: Spinal Brace Spinal Brace: Thoracolumbosacral orthotic;Applied in sitting position Restrictions Weight Bearing Restrictions: No      Mobility Bed Mobility Overal bed mobility: Needs Assistance Bed Mobility: Rolling;Sidelying to Sit;Sit to Sidelying Rolling: Min guard Sidelying to sit: Min guard;Supervision     Sit to sidelying: Min assist General bed mobility comments: assist for legs back to bed; pt anxious to lie down because of lightheadedness  Transfers Overall transfer level: Needs assistance Equipment used: Rolling walker (2 wheeled) Transfers: Sit to/from [facility] Sit to Stand: Min assist;Min guard Stand pivot transfers: Min guard       General transfer comment: more assistance from 3:1 commode    Balance Overall balance assessment: Needs assistance;History of Falls         Standing balance support: Bilateral upper extremity supported Standing balance-Leahy Scale: Fair Standing balance comment: fair this date  with RW, cues to keep eyes open to assist with balance                           ADL either performed or assessed with clinical judgement   ADL Overall ADL's : Needs assistance/impaired                         Toilet Transfer: Minimal assistance;+2 for safety/equipment;Stand-pivot;BSC;RW             General ADL Comments: pt lightheaded during transfer to 3:1. Stood an additional time for a minute.  Based on clinical judgment, pt needs min A for UB adls and mod for LB crossing legs.  She needed total A for TLSO.      Vision         Perception     Praxis      Pertinent Vitals/Pain Pain Assessment: Faces Pain Score: (1.5/10 while lying/stationary; pain increases when she tries to stand) Faces Pain Scale: Hurts little more Pain Location: during transfers/standing Pain Descriptors / Indicators: Aching Pain Intervention(s): Limited activity within patient's tolerance;Monitored during session;Premedicated before session;Repositioned     Hand Dominance     Extremity/Trunk Assessment  bil UE appear wfls for adls; L weaker grip and has swelling in forearms she has had x 6 weeks.  She is wearing an over the counter wrist cock up splint           Communication Communication Communication: No difficulties   Cognition Arousal/Alertness: Awake/alert Behavior During Therapy: WFL for tasks assessed/performed Overall Cognitive Status: Within Functional  Limits for tasks assessed                                     General Comments    C/o lightheadedness. See vitals section of chart; did not do full orthostatics as she was just dizzy when she sat up. She indicates that this has been happening over time   Exercises     Shoulder Instructions      Home Living Family/patient expects to be discharged to:: Private residence Living Arrangements: Spouse/significant other Available Help at Discharge: Family;Available 24 hours/day                Bathroom Shower/Tub: (1/2 bath on main level where she has stayed x 10 mo)   Bathroom Toilet: Standard         Additional Comments: 73 year old granddaughter in home with her 59 mo old      Prior Functioning/Environment          Comments: had assist walking to bathroom        OT Problem List:        OT Treatment/Interventions:      OT Goals(Current goals can be found in the care plan section) Acute Rehab OT Goals Patient Stated Goal: less pain.  OT Goal Formulation: All assessment and education complete, DC therapy  OT Frequency:     Barriers to D/C:            Co-evaluation PT/OT/SLP Co-Evaluation/Treatment: Yes Reason for Co-Treatment: For patient/therapist safety PT goals addressed during session: Mobility/safety with mobility OT goals addressed during session: ADL's and self-care      AM-PAC OT "6 Clicks" Daily Activity     Outcome Measure Help from another person eating meals?: None Help from another person taking care of personal grooming?: A Little Help from another person toileting, which includes using toliet, bedpan, or urinal?: A Lot Help from another person bathing (including washing, rinsing, drying)?: A Lot Help from another person to put on and taking off regular upper body clothing?: A Little Help from another person to put on and taking off regular lower body clothing?: A Lot 6 Click Score: 16   End of Session    Activity Tolerance: (limited by lightheadedness) Patient left: in bed;with call bell/phone within reach;with bed alarm set  OT Visit Diagnosis: Unsteadiness on feet (R26.81)                Time: 6553-7482 OT Time Calculation (min): 31 min Charges:  OT General Charges $OT Visit: 1 Visit OT Evaluation $OT Eval Low Complexity: 1 Low  Lesle Chris, OTR/L Acute Rehabilitation Services [phone] [facility] pager [phone] office 07/20/2018  [place] 07/20/2018, 11:36 AM

## 2018-07-20 NOTE — Progress Notes (Signed)
Physical Therapy Treatment Patient Details Name: [name] [name] MRN: [id number] DOB: [birth year] Today's Date: 07/20/2018    History of Present Illness 78 yo female admitted with L1 vertebral fx. Hx of R hip fx s/p IM nail 2014, L femur fx s/p IM nail 2017, blindness L eye, DM, A fib    PT Comments    Pt received in bed and was agreeable to PT treatment; co-treat with OT this date. Pt demo'd improved bed mobility this date requiring CGA - supervision to complete logroll. Min guard-min A required for STS transfers and able to perform SPT bed <> BSC today to practice home setup as pt is declining SNF. Min cues for form during for safety but overall, pt did well with transfer. Pain continues to be main limiting factor with higher-level functional mobility. Continue to recommend SNF upon d/c due to her deficits and impairments listed below, but since she is declining placement, recommend max HH assistance.     Follow Up Recommendations  SNF     Equipment Recommendations  None recommended by PT    Recommendations for Other Services       Precautions / Restrictions Precautions Precautions: Fall;Back Precaution Comments: logroll Required Braces or Orthoses: Spinal Brace Spinal Brace: Thoracolumbosacral orthotic;Applied in sitting position Restrictions Weight Bearing Restrictions: No    Mobility  Bed Mobility Overal bed mobility: Needs Assistance Bed Mobility: Rolling;Sidelying to Sit;Sit to Sidelying Rolling: Min guard;Supervision Sidelying to sit: Min guard;Supervision     Sit to sidelying: Min guard;Supervision General bed mobility comments: Increased time. [place] on bedrail. Min cues for form with supine <> sit  Transfers Overall transfer level: Needs assistance Equipment used: Rolling walker (2 wheeled) Transfers: Sit to/from [facility] Sit to Stand: From elevated surface;Min guard;Min assist Stand pivot transfers: Min guard;Min assist        General transfer comment: Min A to guard during for rise, stabilize, control descent. VCs safety, technique, hand placement. Increased time. Sit to stand x 2 at EOB and on 2nd attempt, performed SPT to [facility] to demo/practice home setup since pt is declining going to SNF  Ambulation/Gait             General Gait Details: limited to SPT BSC <> bed; did not wish to mobilize further due to pain   Stairs             Wheelchair Mobility    Modified Rankin (Stroke Patients Only)       Balance Overall balance assessment: Needs assistance;History of Falls         Standing balance support: Bilateral upper extremity supported Standing balance-Leahy Scale: Fair Standing balance comment: fair this date with RW, cues to keep eyes open to assist with balance                            Cognition Arousal/Alertness: Awake/alert Behavior During Therapy: WFL for tasks assessed/performed Overall Cognitive Status: Within Functional Limits for tasks assessed                                        Exercises      General Comments        Pertinent Vitals/Pain Pain Assessment: Faces Pain Score: (1.5/10 while lying/stationary; pain increases when she tries to stand) Faces Pain Scale: Hurts even more Pain Location: during transfers/standing Pain Descriptors /  Indicators: Sharp;Aching;Grimacing;Guarding Pain Intervention(s): Limited activity within patient's tolerance;Monitored during session;Premedicated before session;Repositioned;Utilized relaxation techniques    Home Living                      Prior Function            PT Goals (current goals can now be found in the care plan section) Acute Rehab PT Goals Patient Stated Goal: less pain.  PT Goal Formulation: With patient Time For Goal Achievement: 08/02/18 Potential to Achieve Goals: Good    Frequency    Min 3X/week      PT Plan      Co-evaluation               AM-PAC PT "6 Clicks" Mobility   Outcome Measure  Help needed turning from your back to your side while in a flat bed without using bedrails?: A Little Help needed moving from lying on your back to sitting on the side of a flat bed without using bedrails?: A Little Help needed moving to and from a bed to a chair (including a wheelchair)?: A Little Help needed standing up from a chair using your arms (e.g., wheelchair or bedside chair)?: A Little Help needed to walk in hospital room?: A Lot Help needed climbing 3-5 steps with a railing? : Total 6 Click Score: 15    End of Session Equipment Utilized During Treatment: Back brace(TLSO) Activity Tolerance: Patient limited by pain;Patient tolerated treatment well Patient left: in bed;with call bell/phone within reach;with bed alarm set   PT Visit Diagnosis: Muscle weakness (generalized) (M62.81);Pain;Difficulty in walking, not elsewhere classified (R26.2);History of falling (Z91.81) Pain - Right/Left: Right Pain - part of body: Hip(back)     Time: 1537-9432 PT Time Calculation (min) (ACUTE ONLY): 29 min  Charges:  $Therapeutic Activity: 8-22 mins                        Geraldine Solar PT, DPT

## 2018-07-20 NOTE — Progress Notes (Signed)
Pt discharged to home, instructions reviewed, BSC at bedside and instructed on use, demonstrate and acknowledged understanding of discharge instructions. SRP, RN

## 2018-07-20 NOTE — Discharge Instructions (Signed)
Spinal Compression Fracture ° °A spinal compression fracture is a collapse of the bones that form the spine (vertebrae). With this type of fracture, the vertebrae become pushed (compressed) into a wedge shape. Most compression fractures happen in the middle or lower part of the spine. °What are the causes? °This condition may be caused by: °· Thinning and loss of density in the bones (osteoporosis). This is the most common cause. °· A fall. °· A car or motorcycle accident. °· Cancer. °· Trauma, such as a heavy, direct hit to the head or back. °What increases the risk? °You are more likely to develop this condition if: °· You are 60 years or older. °· You have osteoporosis. °· You have certain types of cancer, including: °? Multiple myeloma. °? Lymphoma. °? Prostate cancer. °? Lung cancer. °? Breast cancer. °What are the signs or symptoms? °Symptoms of this condition include: °· Severe pain. °· Pain that gets worse over time. °· Pain that is worse when you stand, walk, sit, or bend. °· Sudden pain that is so bad that it is hard for you to move. °· Bending or humping of the spine. °· Gradual loss of height. °· Numbness, tingling, or weakness in the back and legs. °· Trouble walking. °Your symptoms will depend on the cause of the fracture and how quickly it develops. °How is this diagnosed? °This condition may be diagnosed based on symptoms, medical history, and a physical exam. During the physical exam, your health care provider may tap along the length of your spine to check for tenderness. Tests may be done to confirm the diagnosis. They may include: °· A bone mineral density test to check for osteoporosis. °· Imaging tests, such as a spine X-ray, CT scan, or MRI. °How is this treated? °Treatment for this condition depends on the cause and severity of the condition. Some fractures may heal on their own with supportive care. Treatment may include: °· Pain medicine. °· Rest. °· A back brace. °· Physical therapy  exercises. °· Medicine to strengthen bone. °· Calcium and vitamin D supplements. °Fractures that cause the back to become misshapen, cause nerve pain or weakness, or do not respond to other treatment may be treated with surgery. This may include: °· Vertebroplasty. Bone cement is injected into the collapsed vertebrae to stabilize them. °· Balloon kyphoplasty. The collapsed vertebrae are expanded with a balloon and then bone cement is injected into them. °· Spinal fusion. The collapsed vertebrae are connected (fused) to normal vertebrae. °Follow these instructions at home: °Medicines °· Take over-the-counter and prescription medicines only as told by your health care provider. °· Do not drive or operate heavy machinery while taking prescription pain medicine. °· If you are taking prescription pain medicine, take actions to prevent or treat constipation. Your health care provider may recommend that you: °? Drink enough fluid to keep your urine pale yellow. °? Eat foods that are high in fiber, such as fresh fruits and vegetables, whole grains, and beans. °? Limit foods that are high in fat and processed sugars, such as fried or sweet foods. °? Take an over-the-counter or prescription medicine for constipation. °If you have a brace: °· Wear the brace as told by your health care provider. Remove it only as told by your health care provider. °· Loosen the brace if your fingers or toes tingle, become numb, or turn cold and blue. °· Keep the brace clean. °· If the brace is not waterproof: °? Do not let it get wet. °?   Wear the brace as told by your health care provider. Remove it only as told by your health care provider.   Loosen the brace if your fingers or toes tingle, become numb, or turn cold and blue.   Keep the brace clean.   If the brace is not waterproof:  ? Do not let it get wet.  ? Cover it with a watertight covering when you take a bath or a shower.  Managing pain, stiffness, and swelling     If directed, apply ice to the injured area:  ? If you have a removable brace, remove it as told by your health care provider.  ? Put ice in a plastic bag.  ? Place a towel between your skin and the bag.  ? Leave the ice on for 30 minutes every two hours at first. Then apply the ice as  needed.  Activity   Rest as told by your health care provider.  ? Avoid sitting for a long time without moving. Get up to take short walks every 1-2 hours. This is important to improve blood flow and breathing. Ask for help if you feel weak or unsteady.   Return to your normal activities as directed by your health care provider. Ask what activities are safe for you.   Do exercises to improve motion and strength in your back (physical therapy), as recommended by your health care provider.   Exercise regularly as directed by your health care provider.  General instructions     Do not drink alcohol. Alcohol can interfere with your treatment.   Do not use any products that contain nicotine or tobacco, such as cigarettes and e-cigarettes. These can delay bone healing. If you need help quitting, ask your health care provider.   Keep all follow-up visits as told by your health care provider. This is important. It can help to prevent permanent injury, disability, and long-lasting (chronic) pain.  Contact a health care provider if:   You have a fever.   You develop a cough that makes your pain worse.   Your pain medicine is not helping.   Your pain does not get better over time.   You cannot return to your normal activities as planned or expected.  Get help right away if:   Your pain is very bad and it suddenly gets worse.   You are unable to move any body part (paralysis) that is below the level of your injury.   You have numbness, tingling, or weakness in any body part that is below the level of your injury.   You cannot control your bladder or bowels.  Summary   A spinal compression fracture is a collapse of the bones that form the spine (vertebrae).   With this type of fracture, the vertebrae become pushed (compressed) into a wedge shape.   Your symptoms and treatment will depend on the cause and severity of the fracture and how quickly it develops.   Some fractures may heal on their own with  supportive care. Fractures that cause the back to become misshapen, cause nerve pain or weakness, or do not respond to other treatment may be treated with surgery.  This information is not intended to replace advice given to you by your health care provider. Make sure you discuss any questions you have with your health care provider.  Document Released: 02/16/2005 Document Revised: 03/30/2017 Document Reviewed: 03/30/2017  [employer]vier Interactive Patient Education  2019 [employer]

## 2018-07-20 NOTE — TOC Transition Note (Signed)
Transition of Care [facility]) - CM/SW Discharge Note   Patient Details  Name: [name] [name] MRN: [id number] Date of Birth: 02/15/1941  Transition of [facility]) CM/SW Contact:  Joaquin Courts, RN Phone Number: 07/20/2018, 11:44 AM   Clinical Narrative:  CM spoke with [employer] representative, Zack, regatding pt need for 3-in-1.  [employer] to provide DME.  [facility]) to provide [facility] PT and OT, arrangements made by [facility] 07/19/18.     Final next level of care: [place]     Patient Goals and CMS Choice Patient states their goals for this hospitalization and ongoing recovery are:: Pt wans to go home   Choice offered to / list presented to : Patient  Discharge Placement                       Discharge Plan and Services                          HH Arranged: PT [facility] Agency: [employer] ([place]) Date [place]: 07/19/18 Time [place]: 1627 Representative spoke with at [place]: [place] ([place]) Interventions     Readmission Risk Interventions No flowsheet data found.

## 2018-07-20 NOTE — Discharge Summary (Signed)
Physician Discharge Summary  [name] [name] HWE:[id number] DOB: June 17, 1940 DOA: 07/18/2018  PCP: Colon Branch, MD  Admit date: 07/18/2018 Discharge date: 07/20/2018  Admitted From: Home  Disposition:  Home with HHPT   Recommendations for Outpatient Follow-up:  1. Follow up with PCP in 1 week 2. Dr. Larose Kells: If pain worsening in 1 week, please refer for outpatient kyphoplasty 3. Dr. Larose Kells: Please consider bisphosphonate given new compression fracture 4. Dr. Larose Kells: Note, urine isolate is ESBL.  In light of fact that patient had no symptoms, cephalexin was stopped, please monitor     Home Health: Yes  Equipment/Devices: 3-in-1  Discharge Condition: Fair  CODE STATUS: FULL Diet recommendation: Cardiac, diabetic  Brief/Interim Summary: [name] [name] is a 78 y.o. 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  In ER, found to have lumbar compression fracture.         PRINCIPAL HOSPITAL DIAGNOSIS: Lumbar compression fracture    Discharge Diagnoses:   L1 lumbar vertebral compression fracture MRI lumbar spine showed acute L1 fracture (CT lumbar spine at previous ER visit had been read "does not appear acute").  Neurosurgery were consulted who recommended TLSO while out of bed, follow up with PCP in 1-2 weeks and consideration of outpatient kyphoplasty if no improvement. PT recommended HHPT and 3-in-1.  Pain was well controlled on low dose oral opiate. -Follow up with PCP -2 weeks calcitonin -Kyphoplasty if no improvement -Bisphosphonate as outpatient    Asymptomatic bacteriuria with ESBL Klebsiella Patient and husband note she has had absolutely no urinary symptoms.  Urine positive for ESBL.  This appears to be asymptomatic colonization.   -Clinical monitoring off antibiotics  Paroxysmal atrial fibrillation Rate controlled.  On warfarin.  Would need to hold 1  week before kyphoplasty.  Coronary disease secondary prevention  CKD III  Diabetes  SARS-CoV-2 testing was obtained for screening purposes.  COVID ruled out.           Discharge Instructions  Discharge Instructions    Diet - low sodium heart healthy   Complete by:  As directed    Discharge instructions   Complete by:  As directed    From Dr. Loleta Books: You were admitted with back pain, that turned out to be from a compression fracture.  A compression fracture develops when you have brittle bones, which is common in the elderly.  This area should heal gradually over the next two weeks.  Most people have resolution of pain within 2 weeks. Wear the special brace (TLSO brace) when out of bed or out of a chair, which will help reduce your pain.  Use calcitonin nasal spray, once daily for 2 weeks then stop. Option 1 for pain (Tylenol ONLY): Take acetaminophen 1000 mg (TWO TABS) three times daily  Option 2 for pain: Take acetaminophen 500 mg (ONE TAB) three times a day Use Vicodin 1/2 tab in addition, once, twice or three times per day  Call Dr. Larose Kells today to schedule a follow up appointment in 1 week. If the pain is improving as expected, he can help you taper off Vicodin and get back to normal. If the pain is not improving, he can make arrangements to have you get a procedure.  Resume all your normal home medicines, except STOP taking the new antibiotic Cephalexin that they gave you in the ER last week.  If  you develop bladder pain or burning with urination or fever at any time, let Dr. Larose Kells know.   Increase activity slowly   Complete by:  As directed      Allergies as of 07/20/2018      Reactions   Procaine Hcl Anaphylaxis   Amoxicillin Itching   Did it involve swelling of the face/tongue/throat, SOB, or low BP? No Did it involve sudden or severe rash/hives, skin peeling, or any reaction on the inside of your mouth or nose? No Did you need to seek medical attention at  a hospital or doctor's office? No When did it last happen?unk If all above answers are "NO", may proceed with cephalosporin use.      Medication List    STOP taking these medications   cephALEXin 500 MG capsule Commonly known as:  KEFLEX   oxyCODONE-acetaminophen 5-325 MG tablet Commonly known as:  PERCOCET/ROXICET     TAKE these medications   aspirin EC 81 MG tablet Take 1 tablet (81 mg total) by mouth daily.   atorvastatin 80 MG tablet Commonly known as:  LIPITOR Take 1 tablet (80 mg total) by mouth daily.   calcitonin (salmon) 200 UNIT/ACT nasal spray Commonly known as:  Miacalcin Place 1 spray into alternate nostrils daily for 14 days.   calcium-vitamin D 500-200 MG-UNIT tablet Commonly known as:  OSCAL WITH D Take 1 tablet by mouth daily.   docusate sodium 100 MG capsule Commonly known as:  COLACE Take 1 capsule (100 mg total) by mouth daily for 7 days. While taking pain medications   escitalopram 10 MG tablet Commonly known as:  LEXAPRO TAKE 2 TABLETS BY MOUTH ONCE DAILY What changed:  how much to take   furosemide 40 MG tablet Commonly known as:  LASIX Take 1 tablet (40 mg total) by mouth 2 (two) times daily as needed for fluid or edema.   HYDROcodone-Acetaminophen 7.5-300 MG Tabs Take 0.5 tablets by mouth 4 (four) times daily as needed. What changed:  how much to take   insulin NPH-regular Human (70-30) 100 UNIT/ML injection Commonly known as:  NovoLIN 70/30 ReliOn 31 units with breakfast and 4 units with evening meal. What changed:    how much to take  how to take this  when to take this   metoprolol succinate 25 MG 24 hr tablet Commonly known as:  TOPROL-XL Take 1 tablet (25 mg total) by mouth daily. Take with or immediately following a meal.   MULTIVITAMIN ADULT PO Take 1 tablet by mouth daily with breakfast.   pantoprazole 40 MG tablet Commonly known as:  PROTONIX Take 1 tablet (40 mg total) by mouth daily.   warfarin 2.5 MG  tablet Commonly known as:  COUMADIN Take as directed. If you are unsure how to take this medication, talk to your nurse or doctor. Original instructions:  TAKE AS DIRECTED BY  COUMADIN  CLINIC What changed:  See the new instructions.            Durable Medical Equipment  (From admission, onward)         Start     Ordered   07/20/18 1126  DME 3-in-1  Once     07/20/18 1131         Follow-up Information    Colon Branch, MD Follow up.   Specialty:  Internal Medicine Why:  call Dr. Larose Kells for a follow up in 1 week Contact information: [address] [place] [address] [phone]  Allergies  Allergen Reactions  . Procaine Hcl Anaphylaxis  . Amoxicillin Itching     Did it involve swelling of the face/tongue/throat, SOB, or low BP? No Did it involve sudden or severe rash/hives, skin peeling, or any reaction on the inside of your mouth or nose? No Did you need to seek medical attention at a hospital or doctor's office? No When did it last happen?unk If all above answers are "NO", may proceed with cephalosporin use.     Consultations:  Neurosurgery   Procedures/Studies: Ct Lumbar Spine Wo Contrast  Result Date: 07/15/2018 CLINICAL DATA:  Low back and right hip pain. Previous sacral fracture on 06/15/2016. EXAM: CT LUMBAR SPINE WITHOUT CONTRAST TECHNIQUE: Multidetector CT imaging of the lumbar spine was performed without intravenous contrast administration. Multiplanar CT image reconstructions were also generated. COMPARISON:  CT scan of the abdomen and pelvis dated 05/20/2015 and CT scan of the right hip dated 06/15/2016 FINDINGS: Segmentation: 5 lumbar type vertebrae. Alignment: There is a healed transverse fracture of the sacrum at the S1-2 level with a residual 6 mm anterolisthesis of S1 on S2. Vertebrae: There is a mild compression fracture of the inferior endplate of L1 which is new since the prior CT scan but does not appear acute. There  is severe degenerative changes of the vertebral endplates at D3-2. There is severe left facet arthritis at L5-S1. Paraspinal and other soft tissues: Aortic atherosclerosis. Stable 16 mm cyst in the medial aspect of the mid left kidney. Disc levels: T11-12: Negative. T12-L1: Negative. L1-2: Old mild compression fracture of the inferior endplate of L1. No disc bulging or protrusion. Hypertrophy of the ligamentum flavum creates mild spinal stenosis. L2-3: Severe degenerative disc disease with a broad-based disc bulge with accompanying osteophytes. Slight hypertrophy of the ligamentum flavum and facet joints combine to create moderately severe spinal stenosis. L3-4: Small broad-based disc bulge with accompanying osteophytes asymmetric to the right combine with hypertrophy of the ligamentum flavum create severe spinal stenosis with compression of both lateral recesses. Moderate right foraminal stenosis. L4-5: Small broad-based soft disc protrusion. Hypertrophy of the ligamentum flavum and slight facet arthritis combine to create severe spinal stenosis as well as moderate right and severe left foraminal stenosis. L5-S1: Small broad-based disc bulge without neural impingement. Moderately severe left foraminal stenosis. Severe left facet arthritis. IMPRESSION: 1. Severe spinal stenosis at L3-4 and L4-5. 2. Moderately severe spinal stenosis at L2-3. 3. Severe left foraminal stenosis at L4-5 and L5-S1. 4. Old healed fracture of the proximal sacrum with secondary residual anterolisthesis at S1-2. 5. Old mild compression fracture of the anterior aspect of the inferior endplate of L1. 6.  Aortic Atherosclerosis (ICD10-I70.0). Electronically Signed   By: Lorriane Shire M.D.   On: 07/15/2018 15:00   Mr Lumbar Spine Wo Contrast  Result Date: 07/18/2018 CLINICAL DATA:  Severe low back pain. EXAM: MRI LUMBAR SPINE WITHOUT CONTRAST TECHNIQUE: Multiplanar, multisequence MR imaging of the lumbar spine was performed. No intravenous  contrast was administered. COMPARISON:  CT lumbar spine dated Jul 15, 2018. CT abdomen pelvis dated May 20, 2015. FINDINGS: Segmentation:  Standard. Alignment: Unchanged trace retrolisthesis at L3-L4. Healed S1 fracture with residual 6 mm anterolisthesis at S1-S2. Vertebrae: Mild acute to subacute L1 inferior endplate compression fracture with 20% height loss. No retropulsion. No evidence of discitis or focal bone lesion. Conus medullaris and cauda equina: Conus extends to the L1-L2 level. Conus and cauda equina appear normal. Paraspinal and other soft tissues: Bilateral renal atrophy and cysts. Disc levels:  T12-L1:  Minimal disc bulging.  No stenosis. L1-L2: Minimal disc bulging. Prominent ligamentum flavum hypertrophy. Mild-to-moderate spinal canal stenosis. No neuroforaminal stenosis. L2-L3: Circumferential disc osteophyte complex. Mild bilateral facet arthropathy and ligamentum flavum hypertrophy. Severe spinal canal and lateral recess stenosis. No neuroforaminal stenosis. L3-L4: Circumferential disc bulging posterior right-sided disc osteophyte complex. Mild bilateral facet arthropathy with ligamentum flavum hypertrophy. Severe spinal canal and lateral recess stenosis. Moderate right and mild left neuroforaminal stenosis. L4-L5: Mild disc bulging with superimposed tiny central disc protrusion. Mild bilateral facet arthropathy. Mild moderate spinal canal stenosis. Severe left and moderate right lateral recess stenosis. Moderate to severe left and mild right neuroforaminal stenosis. L5-S1: Small central disc protrusion and annular fissure. Severe left and mild right facet arthropathy. Severe left and mild right neuroforaminal stenosis. No spinal canal stenosis. IMPRESSION: 1. Acute to subacute mild L1 inferior endplate compression fracture with 20% height loss. No retropulsion. 2. Advanced multilevel lumbar spondylosis as described above. Severe spinal canal stenosis at L2-L3 and L3-L4. Severe left  neuroforaminal stenosis at L4-L5 and L5-S1. Electronically Signed   By: Titus Dubin M.D.   On: 07/18/2018 22:19   Dg Hip Unilat W Or Wo Pelvis 2-3 Views Right  Result Date: 07/15/2018 CLINICAL DATA:  Right hip pain EXAM: DG HIP (WITH OR WITHOUT PELVIS) 2-3V RIGHT COMPARISON:  06/15/2016 FINDINGS: Right hip screw and intramedullary rod from prior fracture fixation. Proximal right femur fracture appears healed. No acute fracture. Chronic fracture right inferior pubic ramus. Chronic fracture left intertrochanteric femur with compression screw and intramedullary rod which are in good position. IMPRESSION: Healed fractures of the proximal femur bilaterally. No acute fracture. Electronically Signed   By: Franchot Gallo M.D.   On: 07/15/2018 14:53      Subjective: Some back pain with extension.  No fever, dysuria, urinary urgency or frequency or fould urine odor.    Discharge Exam: Vitals:   07/20/18 1112 07/20/18 1340  BP: 123/62 (!) 117/49  Pulse:  60  Resp:  16  Temp:  98.1 F (36.7 C)  SpO2:  90%   Vitals:   07/20/18 0632 07/20/18 1106 07/20/18 1112 07/20/18 1340  BP:  (!) 109/58 123/62 (!) 117/49  Pulse:  72  60  Resp:    16  Temp:    98.1 F (36.7 C)  TempSrc:      SpO2:  100%  90%  Weight: 84.1 kg     Height:        General: Pt is alert, awake, not in acute distress, conversational, lying in bed. Cardiovascular: RRR, nl S1-S2, no murmurs appreciated.   No LE edema.   Respiratory: Normal respiratory rate and rhythm.  CTAB without rales or wheezes. Abdominal: Abdomen soft and non-tender.  No distension or HSM.   Neuro/Psych: Strength symmetric in upper and lower extremities.  Judgment and insight appear normal.   The results of significant diagnostics from this hospitalization (including imaging, microbiology, ancillary and laboratory) are listed below for reference.     Microbiology: Recent Results (from the past 240 hour(s))  Urine culture     Status: Abnormal    Collection Time: 07/15/18  4:19 PM  Result Value Ref Range Status   Specimen Description URINE, RANDOM  Final   Special Requests   Final    NONE Performed at [facility], [address] [place], [place] [address]    Culture (A)  Final    >=100,000 COLONIES/mL KLEBSIELLA PNEUMONIAE Confirmed Extended Spectrum Beta-Lactamase Producer (ESBL).  In bloodstream infections from ESBL organisms, carbapenems are preferred over piperacillin/tazobactam. They are shown to have a lower risk of mortality. >=100,000 COLONIES/mL VIRIDANS STREPTOCOCCUS    Report Status 07/19/2018 FINAL  Final   Organism ID, Bacteria KLEBSIELLA PNEUMONIAE (A)  Final      Susceptibility   Klebsiella pneumoniae - MIC*    AMPICILLIN >=32 RESISTANT Resistant     CEFAZOLIN >=64 RESISTANT Resistant     CEFTRIAXONE >=64 RESISTANT Resistant     CIPROFLOXACIN >=4 RESISTANT Resistant     GENTAMICIN >=16 RESISTANT Resistant     IMIPENEM 0.5 SENSITIVE Sensitive     NITROFURANTOIN 256 RESISTANT Resistant     TRIMETH/SULFA 80 RESISTANT Resistant     AMPICILLIN/SULBACTAM >=32 RESISTANT Resistant     PIP/TAZO <=4 SENSITIVE Sensitive     Extended ESBL POSITIVE Resistant     * >=100,000 COLONIES/mL KLEBSIELLA PNEUMONIAE  SARS Coronavirus 2 (CEPHEID - Performed in [place] hospital lab), Hosp Order     Status: None   Collection Time: 07/18/18 11:14 PM  Result Value Ref Range Status   SARS Coronavirus 2 NEGATIVE NEGATIVE Final    Comment: (NOTE) If result is NEGATIVE SARS-CoV-2 target nucleic acids are NOT DETECTED. The SARS-CoV-2 RNA is generally detectable in upper and lower  respiratory specimens during the acute phase of infection. The lowest  concentration of SARS-CoV-2 viral copies this assay can detect is 250  copies / mL. A negative result does not preclude SARS-CoV-2 infection  and should not be used as the sole basis for treatment or other  patient management decisions.  A negative result may occur with   improper specimen collection / handling, submission of specimen other  than nasopharyngeal swab, presence of viral mutation(s) within the  areas targeted by this assay, and inadequate number of viral copies  (<250 copies / mL). A negative result must be combined with clinical  observations, patient history, and epidemiological information. If result is POSITIVE SARS-CoV-2 target nucleic acids are DETECTED. The SARS-CoV-2 RNA is generally detectable in upper and lower  respiratory specimens dur ing the acute phase of infection.  Positive  results are indicative of active infection with SARS-CoV-2.  Clinical  correlation with patient history and other diagnostic information is  necessary to determine patient infection status.  Positive results do  not rule out bacterial infection or co-infection with other viruses. If result is PRESUMPTIVE POSTIVE SARS-CoV-2 nucleic acids MAY BE PRESENT.   A presumptive positive result was obtained on the submitted specimen  and confirmed on repeat testing.  While 2019 novel coronavirus  (SARS-CoV-2) nucleic acids may be present in the submitted sample  additional confirmatory testing may be necessary for epidemiological  and / or clinical management purposes  to differentiate between  SARS-CoV-2 and other Sarbecovirus currently known to infect humans.  If clinically indicated additional testing with an alternate test  methodology [phone]) is advised. The SARS-CoV-2 RNA is generally  detectable in upper and lower respiratory sp ecimens during the acute  phase of infection. The expected result is Negative. Fact Sheet for Patients:  StrictlyIdeas.no Fact Sheet for Healthcare Providers: BankingDealers.co.za This test is not yet approved or cleared by the [place] FDA and has been authorized for detection and/or diagnosis of SARS-CoV-2 by FDA under an Emergency Use Authorization (EUA).  This EUA will  remain in effect (meaning this test can be used) for the duration of the COVID-19 declaration under Section 564(b)(1) of the Act, 21 U.S.C. section 360bbb-3(b)(1), unless the authorization  is terminated or revoked sooner. Performed at [facility], [place] [address] [place], [place] [address]   MRSA PCR Screening     Status: None   Collection Time: 07/19/18  1:04 AM  Result Value Ref Range Status   MRSA by PCR NEGATIVE NEGATIVE Final    Comment:        The GeneXpert MRSA Assay (FDA approved for NASAL specimens only), is one component of a comprehensive MRSA colonization surveillance program. It is not intended to diagnose MRSA infection nor to guide or monitor treatment for MRSA infections. Performed at [facility], [place] [address] [place], [place] [address]      Labs: BNP (last 3 results) No results for input(s): BNP in the last 8760 hours. Basic Metabolic Panel: Recent Labs  Lab 07/15/18 1552 07/18/18 1850 07/19/18 0514 07/20/18 0422  NA 144 138 138 138  K 3.4* 3.8 4.6 4.0  CL 106 100 100 102  CO2 28 25 28 24   GLUCOSE 49* 297* 293* 121*  BUN 21 36* 41* 48*  CREATININE 1.66* 1.72* 1.90* 2.11*  CALCIUM 9.8 9.1 9.0 9.1   Liver Function Tests: No results for input(s): AST, ALT, ALKPHOS, BILITOT, PROT, ALBUMIN in the last 168 hours. No results for input(s): LIPASE, AMYLASE in the last 168 hours. No results for input(s): AMMONIA in the last 168 hours. CBC: Recent Labs  Lab 07/15/18 1552 07/18/18 1850 07/19/18 0514  WBC 10.4 10.3 7.9  NEUTROABS 5.9 6.0  --   HGB 12.7 12.7 12.2  HCT 39.1 38.7 38.0  MCV 93.1 93.7 94.5  PLT 396 419* 399   Cardiac Enzymes: No results for input(s): CKTOTAL, CKMB, CKMBINDEX, TROPONINI in the last 168 hours. BNP: Invalid input(s): POCBNP CBG: Recent Labs  Lab 07/19/18 0736 07/19/18 1154 07/19/18 1659 07/19/18 2244 07/20/18 0733  GLUCAP 242* 180* 96 92 137*   D-Dimer No results for  input(s): DDIMER in the last 72 hours. Hgb A1c No results for input(s): HGBA1C in the last 72 hours. Lipid Profile No results for input(s): CHOL, HDL, LDLCALC, TRIG, CHOLHDL, LDLDIRECT in the last 72 hours. Thyroid function studies No results for input(s): TSH, T4TOTAL, T3FREE, THYROIDAB in the last 72 hours.  Invalid input(s): FREET3 Anemia work up No results for input(s): VITAMINB12, FOLATE, FERRITIN, TIBC, IRON, RETICCTPCT in the last 72 hours. Urinalysis    Component Value Date/Time   COLORURINE YELLOW 07/20/2018 0449   APPEARANCEUR CLOUDY (A) 07/20/2018 0449   LABSPEC 1.013 07/20/2018 0449   PHURINE 7.0 07/20/2018 0449   GLUCOSEU NEGATIVE 07/20/2018 0449   GLUCOSEU NEGATIVE 10/15/2016 0824   HGBUR NEGATIVE 07/20/2018 [place] 07/20/2018 0449   BILIRUBINUR neg 05/16/2015 1928   KETONESUR NEGATIVE 07/20/2018 0449   PROTEINUR NEGATIVE 07/20/2018 0449   UROBILINOGEN 0.2 10/15/2016 0824   NITRITE NEGATIVE 07/20/2018 0449   LEUKOCYTESUR LARGE (A) 07/20/2018 0449   Sepsis Labs Invalid input(s): PROCALCITONIN,  WBC,  LACTICIDVEN Microbiology Recent Results (from the past 240 hour(s))  Urine culture     Status: Abnormal   Collection Time: 07/15/18  4:19 PM  Result Value Ref Range Status   Specimen Description URINE, RANDOM  Final   Special Requests   Final    NONE Performed at [facility], [place] [address], [place], [place] [address]    Culture (A)  Final    >=100,000 COLONIES/mL KLEBSIELLA PNEUMONIAE Confirmed Extended Spectrum Beta-Lactamase Producer (ESBL).  In bloodstream infections from ESBL organisms, carbapenems are preferred  over piperacillin/tazobactam. They are shown to have a lower risk of mortality. >=100,000 COLONIES/mL VIRIDANS STREPTOCOCCUS    Report Status 07/19/2018 FINAL  Final   Organism ID, Bacteria KLEBSIELLA PNEUMONIAE (A)  Final      Susceptibility   Klebsiella pneumoniae - MIC*    AMPICILLIN >=32 RESISTANT Resistant      CEFAZOLIN >=64 RESISTANT Resistant     CEFTRIAXONE >=64 RESISTANT Resistant     CIPROFLOXACIN >=4 RESISTANT Resistant     GENTAMICIN >=16 RESISTANT Resistant     IMIPENEM 0.5 SENSITIVE Sensitive     NITROFURANTOIN 256 RESISTANT Resistant     TRIMETH/SULFA 80 RESISTANT Resistant     AMPICILLIN/SULBACTAM >=32 RESISTANT Resistant     PIP/TAZO <=4 SENSITIVE Sensitive     Extended ESBL POSITIVE Resistant     * >=100,000 COLONIES/mL KLEBSIELLA PNEUMONIAE  SARS Coronavirus 2 (CEPHEID - Performed in [place] hospital lab), Hosp Order     Status: None   Collection Time: 07/18/18 11:14 PM  Result Value Ref Range Status   SARS Coronavirus 2 NEGATIVE NEGATIVE Final    Comment: (NOTE) If result is NEGATIVE SARS-CoV-2 target nucleic acids are NOT DETECTED. The SARS-CoV-2 RNA is generally detectable in upper and lower  respiratory specimens during the acute phase of infection. The lowest  concentration of SARS-CoV-2 viral copies this assay can detect is 250  copies / mL. A negative result does not preclude SARS-CoV-2 infection  and should not be used as the sole basis for treatment or other  patient management decisions.  A negative result may occur with  improper specimen collection / handling, submission of specimen other  than nasopharyngeal swab, presence of viral mutation(s) within the  areas targeted by this assay, and inadequate number of viral copies  (<250 copies / mL). A negative result must be combined with clinical  observations, patient history, and epidemiological information. If result is POSITIVE SARS-CoV-2 target nucleic acids are DETECTED. The SARS-CoV-2 RNA is generally detectable in upper and lower  respiratory specimens dur ing the acute phase of infection.  Positive  results are indicative of active infection with SARS-CoV-2.  Clinical  correlation with patient history and other diagnostic information is  necessary to determine patient infection status.  Positive results  do  not rule out bacterial infection or co-infection with other viruses. If result is PRESUMPTIVE POSTIVE SARS-CoV-2 nucleic acids MAY BE PRESENT.   A presumptive positive result was obtained on the submitted specimen  and confirmed on repeat testing.  While 2019 novel coronavirus  (SARS-CoV-2) nucleic acids may be present in the submitted sample  additional confirmatory testing may be necessary for epidemiological  and / or clinical management purposes  to differentiate between  SARS-CoV-2 and other Sarbecovirus currently known to infect humans.  If clinically indicated additional testing with an alternate test  methodology [phone]) is advised. The SARS-CoV-2 RNA is generally  detectable in upper and lower respiratory sp ecimens during the acute  phase of infection. The expected result is Negative. Fact Sheet for Patients:  StrictlyIdeas.no Fact Sheet for Healthcare Providers: BankingDealers.co.za This test is not yet approved or cleared by the [place] FDA and has been authorized for detection and/or diagnosis of SARS-CoV-2 by FDA under an Emergency Use Authorization (EUA).  This EUA will remain in effect (meaning this test can be used) for the duration of the COVID-19 declaration under Section 564(b)(1) of the Act, 21 U.S.C. section 360bbb-3(b)(1), unless the authorization is terminated or revoked sooner. Performed at [facility], [place] [address] [place], [place] [address]   MRSA PCR Screening     Status: None   Collection Time: 07/19/18  1:04 AM  Result Value Ref Range Status   MRSA by PCR NEGATIVE NEGATIVE Final    Comment:        The GeneXpert MRSA Assay (FDA approved for NASAL specimens only), is one component of a comprehensive MRSA colonization surveillance program. It is not intended to diagnose MRSA infection nor to guide or monitor treatment for MRSA infections. Performed at [facility], [place] [address] [place], [place] [address]      Time coordinating discharge: 35 minutes The [place] controlled substances registry was reviewed for this patient prior to filling the <5 days supply controlled substances script.      SIGNED:   Edwin Dada, MD  [facility] 07/20/2018, 2:07 PM

## 2018-07-20 NOTE — Progress Notes (Signed)
[name] for coumadin Indication: atrial fibrillation  Allergies  Allergen Reactions  . Procaine Hcl Anaphylaxis  . Amoxicillin Itching     Did it involve swelling of the face/tongue/throat, SOB, or low BP? No Did it involve sudden or severe rash/hives, skin peeling, or any reaction on the inside of your mouth or nose? No Did you need to seek medical attention at a hospital or doctor's office? No When did it last happen?unk If all above answers are "NO", may proceed with cephalosporin use.     Patient Measurements: Height: 5\' 9"  (175.3 cm) Weight: 185 lb 6.5 oz (84.1 kg) IBW/kg (Calculated) : 66.2  Vital Signs: Temp: 98.2 F (36.8 C) (05/20 0455) Temp Source: Oral (05/20 0455) BP: 129/50 (05/20 0455) Pulse Rate: 62 (05/20 0455)  Labs: Recent Labs    07/18/18 1850 07/19/18 0043 07/19/18 0514 07/20/18 0422  HGB 12.7  --  12.2  --   HCT 38.7  --  38.0  --   PLT 419*  --  399  --   LABPROT  --  25.4*  --  22.6*  INR  --  2.3*  --  2.0*  CREATININE 1.72*  --  1.90*  --     Estimated Creatinine Clearance: 29.2 mL/min (A) (by C-G formula based on SCr of 1.9 mg/dL (H)).   Assessment: [name] on coumadin PTA for afib.  Pharmacy consulted to dose coumadin.  Home dose is 2.5 mg daily -last dose 5/17 at 2100.  Dose held 5/18 pending neurosurgeon consult.  No intervention planned. Admission INR therapeutic at 2.3. CBC WNL, no bleeding reported.   07/20/2018  INR therapeutic at 2. No bleeding reported.    Goal of Therapy:  INR 2-3 Monitor platelets by anticoagulation protocol: Yes   Plan:  continue home dose of coumadin 2.5 mg daily Daily INR  Eudelia Bunch, Pharm.D 07/20/2018 8:08 AM

## 2018-07-21 ENCOUNTER — Telehealth: Payer: Self-pay

## 2018-07-21 ENCOUNTER — Ambulatory Visit (INDEPENDENT_AMBULATORY_CARE_PROVIDER_SITE_OTHER): Payer: Medicare Other | Admitting: Internal Medicine

## 2018-07-21 ENCOUNTER — Telehealth: Payer: Self-pay | Admitting: *Deleted

## 2018-07-21 ENCOUNTER — Other Ambulatory Visit: Payer: Self-pay

## 2018-07-21 ENCOUNTER — Other Ambulatory Visit: Payer: Self-pay | Admitting: Internal Medicine

## 2018-07-21 DIAGNOSIS — M8000XD Age-related osteoporosis with current pathological fracture, unspecified site, subsequent encounter for fracture with routine healing: Secondary | ICD-10-CM

## 2018-07-21 DIAGNOSIS — S32010A Wedge compression fracture of first lumbar vertebra, initial encounter for closed fracture: Secondary | ICD-10-CM | POA: Diagnosis not present

## 2018-07-21 DIAGNOSIS — N189 Chronic kidney disease, unspecified: Secondary | ICD-10-CM | POA: Diagnosis not present

## 2018-07-21 MED ORDER — ALENDRONATE SODIUM 70 MG PO TABS: 70.0000 mg | ORAL_TABLET | ORAL | 11 refills | Status: DC

## 2018-07-21 NOTE — Telephone Encounter (Signed)
Copied from [place] [id number]. Topic: General - Inquiry >> Jul 21, 2018 12:04 PM [name] [name] wrote: Reason for CRM: patient husband called stating wanting to speak to nurse or PCP regarding patient's back, Patient got released from hospital [facility] yesterday 5/20. Call back #  [phone]

## 2018-07-21 NOTE — Telephone Encounter (Signed)
Per chart review, cardiology recommended to stop Plavix and start aspirin 81 mg, see note from 04/12/2018. No refill on Plavix Advised patient to take aspirin 81 mg daily.

## 2018-07-21 NOTE — Telephone Encounter (Signed)
Refill request for Plavix- no longer on med list- please advise.

## 2018-07-21 NOTE — Telephone Encounter (Signed)
Transition Care Management Follow-up Telephone Call   Date discharged?07/20/18    How have you been since you were released from the hospital? "feeling a little better"   Do you understand why you were in the hospital? yes   Do you understand the discharge instructions? yes   Where were you discharged to? Home with husband   Items Reviewed:  Medications reviewed: no. Pt states she will discuss w/ PCP today  Allergies reviewed: yes  Dietary changes reviewed: yes  Referrals reviewed: yes   Functional Questionnaire:   Activities of Daily Living (ADLs):   She states they are independent in the following: ambulation, bathing and hygiene, feeding, continence, grooming, toileting and dressing WEARING BRACE. USES WHEELCHAIR. HUSBAND ASSIST AS NEEDED. States they require assistance with the following: na   Any transportation issues/concerns?: no   Any patient concerns? no   Confirmed importance and date/time of follow-up visits scheduled yes  Provider Appointment booked with VIRTUAL VISIT WITH PCP 340 TODAY  Confirmed with patient if condition begins to worsen call PCP or go to the ER.  Patient was given the office number and encouraged to call back with question or concerns.  : yes

## 2018-07-21 NOTE — Progress Notes (Signed)
Subjective:    Patient ID: [name] [name], female    DOB: 1940/07/30, 78 y.o.   MRN: [id number]  DOS:  07/21/2018 Type of visit - description: TCM Virtual Visit via Video Note  I connected with@ on 07/22/18 at  3:40 PM EDT by a video enabled telemedicine application and verified that I am speaking with the correct person using two identifiers.   THIS ENCOUNTER IS A VIRTUAL VISIT DUE TO COVID-19 - PATIENT WAS NOT SEEN IN THE OFFICE. PATIENT HAS CONSENTED TO VIRTUAL VISIT / TELEMEDICINE VISIT   Location of patient: home  Location of provider: office  I discussed the limitations of evaluation and management by telemedicine and the availability of in person appointments. The patient expressed understanding and agreed to proceed.  History of Present Illness: TCM 7 Since last  visit here on 07/18/2018, the pain at the right back was much worse, went to the ER, MRI was ordered and they found L1 vertebral fracture felt to be the cause of pain. She was admitted to the hospital, neurosurgery consulted, they recommended conservative treatment. They noted UTI, she was asymptomatic thus  no antibiotics were recommended  Since she left the hospital, the pain is under better control, she is following all the instructions.  Labs and imaging from the hospital reviewed.   Review of Systems Denies fever chills No chest pain no difficulty breathing No blood in the stools No cough  Past Medical History:  Diagnosis Date  . Atrial fibrillation ([place])    SVT dx 2007, cath 2007 mild CAD, then had an cardioversion, ablation; still on coumadin , has occ palpitation, EKG 03-2010 NSR  . Blindness of left eye   . CKD (chronic kidney disease), stage III ([place]) 06/16/2016   [name] 06/17/2016  . DIABETES MELLITUS, TYPE II 03/27/2006   dr [name]  . Eye muscle weakness    Right eye weakness after cataract surgery  . GERD (gastroesophageal reflux disease) 10/05/2011  . 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  HYPERLIPIDEMIA 03/27/2006  . HYPERTENSION 03/27/2006  . Intertrochanteric fracture of right hip ([place]) 07/13/2012  . 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 Osteopenia 2004   Dexa 2004 showed Osteopenia, DEXA 03/2007 normal  . Osteopenia   . Other chronic cystitis with hematuria   . Pelvic fracture ([place]) 06/16/2016   S/P fall  . Recurrent urinary tract infection    Seeing Urology  . 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 Seizures ([place])   . 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[place]) 9/48/5462    Past Surgical History:  Procedure Laterality Date  . ANKLE FRACTURE SURGERY Bilateral 08/2015   "right one was in 2 places"  . CARDIAC CATHETERIZATION N/A 05/17/2015   Procedure: Left Heart Cath and Coronary Angiography;  Surgeon: Jettie Booze, MD;  Location: [facility];  Service: Cardiovascular;  Laterality: N/A;  . CARDIAC CATHETERIZATION N/A 05/17/2015   Procedure: Coronary Balloon Angioplasty;  Surgeon: Jettie Booze, MD;  Location: [facility];  Service: Cardiovascular;  Laterality: N/A;  . FEMUR IM NAIL Right 07/15/2012   Procedure: INTRAMEDULLARY (IM) NAIL HIP;  Surgeon: Johnny Bridge, MD;  Location: [facility];  Service: Orthopedics;  Laterality: Right;  . FEMUR IM NAIL Left 12/02/2015   Procedure: INTRAMEDULLARY (IM) NAIL FEMORAL;  Surgeon: [name], MD;  Location: [facility];  Service: Orthopedics;  Laterality: Left;  . FRACTURE SURGERY    .  INCISION AND DRAINAGE HIP Left 01/03/2016   Procedure: IRRIGATION AND DEBRIDEMENT HIP;  Surgeon: [name], MD;  Location: [facility];  Service: Orthopedics;  Laterality: Left;  . SHOULDER SURGERY Bilateral    "don't remember which side"    Social History   Socioeconomic History  . Marital status: Married    Spouse name: Jenny Reichmann  . Number of children: 1  . Years of education: College  . Highest education level: Not on file  Occupational History  . Occupation: retired     Fish farm manager:  RETIRED  Social Needs  . Financial resource strain: Not on file  . Food insecurity:    Worry: Not on file    Inability: Not on file  . Transportation needs:    Medical: Not on file    Non-medical: Not on file  Tobacco Use  . Smoking status: Former Smoker    Types: Cigarettes    Last attempt to quit: 03/02/1978    Years since quitting: 40.4  . Smokeless tobacco: Never Used  Substance and Sexual Activity  . Alcohol use: No    Alcohol/week: 0.0 standard drinks  . Drug use: No  . Sexual activity: Not Currently  Lifestyle  . Physical activity:    Days per week: Not on file    Minutes per session: Not on file  . Stress: Not on file  Relationships  . Social connections:    Talks on phone: Not on file    Gets together: Not on file    Attends religious service: Not on file    Active member of club or organization: Not on file    Attends meetings of clubs or organizations: Not on file    Relationship status: Not on file  . Intimate partner violence:    Fear of current or ex partner: Not on file    Emotionally abused: Not on file    Physically abused: Not on file    Forced sexual activity: Not on file  Other Topics Concern  . Not on file  Social History Narrative   Patient lives at home spouse. Uses a walker consistently.   Moved from [place] 2004   1 son, problems w/ drugs, passed away 17-Mar-2016-- OD          Allergies as of 07/21/2018      Reactions   Procaine Hcl Anaphylaxis   Amoxicillin Itching   Did it involve swelling of the face/tongue/throat, SOB, or low BP? No Did it involve sudden or severe rash/hives, skin peeling, or any reaction on the inside of your mouth or nose? No Did you need to seek medical attention at a hospital or doctor's office? No When did it last happen?unk If all above answers are "NO", may proceed with cephalosporin use.      Medication List       Accurate as of Jul 21, 2018 11:59 PM. If you have any questions, ask your nurse or doctor.         alendronate 70 MG tablet Commonly known as:  FOSAMAX Take 1 tablet (70 mg total) by mouth every 7 (seven) days. Take with a full glass of water on an empty stomach. Started by:  [name], MD   aspirin EC 81 MG tablet Take 1 tablet (81 mg total) by mouth daily.   atorvastatin 80 MG tablet Commonly known as:  LIPITOR Take 1 tablet (80 mg total) by mouth daily.   calcitonin (salmon) 200 UNIT/ACT nasal spray Commonly known as:  Miacalcin Place 1 spray into alternate nostrils daily for 14 days.   calcium-vitamin D 500-200 MG-UNIT tablet Commonly known as:  OSCAL WITH D Take 1 tablet by mouth daily.   docusate sodium 100 MG capsule Commonly known as:  COLACE Take 1 capsule (100 mg total) by mouth daily for 7 days. While taking pain medications   escitalopram 10 MG tablet Commonly known as:  LEXAPRO Take 1 tablet (10 mg total) by mouth daily.   furosemide 40 MG tablet Commonly known as:  LASIX Take 1 tablet (40 mg total) by mouth 2 (two) times daily as needed for fluid. What changed:  reasons to take this Changed by:  [name], MD   HYDROcodone-Acetaminophen 7.5-300 MG Tabs Take 0.5 tablets by mouth 4 (four) times daily as needed.   insulin NPH-regular Human (70-30) 100 UNIT/ML injection Commonly known as:  NovoLIN 70/30 ReliOn 31 units with breakfast and 4 units with evening meal. What changed:    how much to take  how to take this  when to take this   metoprolol succinate 25 MG 24 hr tablet Commonly known as:  TOPROL-XL Take 1 tablet (25 mg total) by mouth daily. Take with or immediately following a meal.   MULTIVITAMIN ADULT PO Take 1 tablet by mouth daily with breakfast.   pantoprazole 40 MG tablet Commonly known as:  PROTONIX Take 1 tablet (40 mg total) by mouth daily.   warfarin 2.5 MG tablet Commonly known as:  COUMADIN Take as directed by the anticoagulation clinic. If you are unsure how to take this medication, talk to your nurse or doctor. Original  instructions:  TAKE AS DIRECTED BY  COUMADIN  CLINIC What changed:  See the new instructions.           Objective:   Physical Exam There were no vitals taken for this visit. This is a virtual video visit, the patient seems much more relaxed today, more comfortable.  She is using a TLSO brace    Assessment      Assessment   DM  + retinopathy, Dr [name] HTN Hyperlipidemia anxiety ativan GERD Elevated creatinine(started after admission 05-2015, had IV contrast) Anemia, normal iron 05-2015, never had a colonoscopy CV ---NSTEMI 05-2015, cath, angiplasty ---Echo, EF 40 % (05-2015) ---Atrial fibrillation, SVT -- dx 2007, cath 2007 mild CAD. 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), sz episode 08-2015 (?) - HAs Blindness, left eye Right eye weakness after cataract surgery Osteopenia per DEXA 2004, DEXA 2009 normal, DEXA 2015 @ [facility] -1.8 H/o lung bx 2008 benign  05-2015- admitted [place] sepsis  08-2015 admitted  B tibular Fx, no surgery, SNF 12-2015:   admitted, hip FX , surgery 01-2016: admitted, hip surgery infex   05-2016: Admitted, fall, pelvic fracture, d/c to home 10-02-16: admitted  UTI, nonketotic hyperglycemic syndrome  PLAN: L1 vertebral fracture: Since the last visit, pain was worse, she went to the ER and subsequently admitted for pain control, further x-rays were done, MRI show acute L1 fracture.  Neurosurgery consulted, they recommended a TLSO, home physical therapy, vertebro plasty if not improving; also RX  calcitonin temporarily for pain control. At this point, the pain is relatively well controlled with hydrocodone on average 2 tablets daily, will call for refills when needed. She just have a PT visit her today. She  will let me know if improvement does not  continue. Osteoporosis: Bone density test 07/2016 showed osteopenia, at the time she was recommended calcium and vitamin D which she takes.  Now she has osteoporosis in light of the L1 fracture. Was prescribed Miacalcin daily for 2 weeks (for pain). Will start Fosamax, Rx sent, precautions carefully discussed.  Reassess with a DEXA in the future. UTI: Asymptomatic, no need for antibiotics. Coumadin management: INR was 2.0 on 07/20/2018, will see the Coumadin clinic on 07/28/2018 CKD: Last creatinine 2.1, slightly up, recheck a BMP next week Plan: BMP next week Follow-up July 2020 already scheduled.     I discussed the assessment and treatment plan with the patient. The patient was provided an opportunity to ask questions and all were answered. The patient agreed with the plan and demonstrated an understanding of the instructions.   The patient was advised to call back or seek an in-person evaluation if the symptoms worsen or if the condition fails to improve as anticipated.

## 2018-07-22 ENCOUNTER — Telehealth: Payer: Self-pay

## 2018-07-22 DIAGNOSIS — M810 Age-related osteoporosis without current pathological fracture: Secondary | ICD-10-CM | POA: Insufficient documentation

## 2018-07-22 NOTE — Assessment & Plan Note (Signed)
L1 vertebral fracture: Since the last visit, pain was worse, she went to the ER and subsequently admitted for pain control, further x-rays were done, MRI show acute L1 fracture.  Neurosurgery consulted, they recommended a TLSO, home physical therapy, vertebro plasty if not improving; also RX  calcitonin temporarily for pain control. At this point, the pain is relatively well controlled with hydrocodone on average 2 tablets daily, will call for refills when needed. She just have a PT visit her today. She will let me know if improvement does not continue. Osteoporosis: Bone density test 07/2016 showed osteopenia, at the time she was recommended calcium and vitamin D which she takes.  Now she has osteoporosis in light of the L1 fracture. Was prescribed Miacalcin daily for 2 weeks (for pain). Will start Fosamax, Rx sent, precautions carefully discussed.  Reassess with a DEXA in the future. UTI: Asymptomatic, no need for antibiotics. Coumadin management: INR was 2.0 on 07/20/2018, will see the Coumadin clinic on 07/28/2018 CKD: Last creatinine 2.1, slightly up, recheck a BMP next week Plan: BMP next week Follow-up July 2020 already scheduled.

## 2018-07-22 NOTE — Telephone Encounter (Signed)
Copied from [place] [id number]. Topic: Quick Communication - Home Health Verbal Orders >> Jul 22, 2018 10:57 AM Richardo Priest, NT wrote: Caller/Agency: [facility] Callback Number: [phone] and it is ok to LVM Requesting OT/PT/Skilled Nursing/Social Work/Speech Therapy: PT Frequency: 1 week 1, 2 week 1, 1 week 3, 1 every other week 4.

## 2018-07-22 NOTE — Telephone Encounter (Signed)
Spoke w/ Tharon Aquas, verbal orders.

## 2018-07-22 NOTE — Telephone Encounter (Signed)
Virtual visit yesterday.

## 2018-07-26 ENCOUNTER — Ambulatory Visit: Payer: Self-pay | Admitting: *Deleted

## 2018-07-26 ENCOUNTER — Ambulatory Visit (INDEPENDENT_AMBULATORY_CARE_PROVIDER_SITE_OTHER): Payer: Medicare Other | Admitting: Orthopaedic Surgery

## 2018-07-26 ENCOUNTER — Other Ambulatory Visit: Payer: Self-pay

## 2018-07-26 ENCOUNTER — Encounter: Payer: Self-pay | Admitting: Orthopaedic Surgery

## 2018-07-26 ENCOUNTER — Telehealth: Payer: Self-pay

## 2018-07-26 VITALS — Ht 69.0 in | Wt 188.0 lb

## 2018-07-26 DIAGNOSIS — M48062 Spinal stenosis, lumbar region with neurogenic claudication: Secondary | ICD-10-CM

## 2018-07-26 DIAGNOSIS — I2510 Atherosclerotic heart disease of native coronary artery without angina pectoris: Secondary | ICD-10-CM | POA: Diagnosis not present

## 2018-07-26 DIAGNOSIS — Z9861 Coronary angioplasty status: Secondary | ICD-10-CM

## 2018-07-26 DIAGNOSIS — S32018A Other fracture of first lumbar vertebra, initial encounter for closed fracture: Secondary | ICD-10-CM

## 2018-07-26 NOTE — Telephone Encounter (Signed)
Patient called and asked about her constipation, she says she's not constipated anymore that she had a large soft/hard stool today. She says she's been taking oxycodone for her back and started taking equate stool softener and no bowel movement for 1 week, until today. She denies blood in the stool, denies other symptoms. I advised I will send this to Dr. Larose Kells and someone will call back with his recommendation, she verbalized understanding.   Answer Assessment - Initial Assessment Questions 1. STOOL PATTERN OR FREQUENCY: "How often do you pass bowel movements (BMs)?"  (Normal range: tid to q 3 days)  "When was the last BM passed?"       Started a week ago 2. STRAINING: "Do you have to strain to have a BM?"      Yes 3. RECTAL PAIN: "Does your rectum hurt when the stool comes out?" If so, ask: "Do you have hemorrhoids? How bad is the pain?"  (Scale 1-10; or mild, moderate, severe)     No 4. STOOL COMPOSITION: "Are the stools hard?"      Soft and hard today 5. BLOOD ON STOOLS: "Has there been any blood on the toilet tissue or on the surface of the BM?" If so, ask: "When was the last time?"      Didn't pay attention 6. CHRONIC CONSTIPATION: "Is this a new problem for you?"  If no, ask: "How long have you had this problem?" (days, weeks, months)      New problem, since taking oxycodone for back 7. CHANGES IN DIET: "Have there been any recent changes in your diet?"      No changes in diet 8. MEDICATIONS: "Have you been taking any new medications?"     Oxycodone 9. LAXATIVES: "Have you been using any laxatives or enemas?"  If yes, ask "What, how often, and when was the last time?"     Nothing but equate stool softenener 10. CAUSE: "What do you think is causing the constipation?"        Medications 11. OTHER SYMPTOMS: "Do you have any other symptoms?" (e.g., abdominal pain, fever, vomiting)       No 12. PREGNANCY: "Is there any chance you are pregnant?" "When was your last menstrual period?"  No  Protocols used: CONSTIPATION-A-AH

## 2018-07-26 NOTE — Progress Notes (Signed)
Office Visit Note   Patient: [name] [name]           Date of Birth: 1940-11-21           MRN: [id number] Visit Date: 07/26/2018              Requested by: Colon Branch, MD [address] [place], [place] [address] PCP: Colon Branch, MD   Assessment & Plan: Visit Diagnoses:  1. Other closed fracture of first lumbar vertebra, initial encounter ([place])   2. Spinal stenosis of lumbar region with neurogenic claudication     Plan: I reviewed the CT scan as well as MRI scan with patient and her husband.  She is on calcitonin nasal spray also started on Fosamax 70 mg weekly and is sitting up taking it appropriately.  She has had some hydrocodone 7.5 for pain medicine since she previously was on oxycodone had problems with that.  We discussed gradually letting her fracture heal she should get them improvement in her pain recheck 1 month repeat AP lateral lumbar spine x-rays to check for further compression.  Follow-Up Instructions: No follow-ups on file.   Orders:  No orders of the defined types were placed in this encounter.  No orders of the defined types were placed in this encounter.     Procedures: No procedures performed   Clinical Data: No additional findings.   Subjective: Chief Complaint  Patient presents with   Lower Back - Pain    HPI 78 year old female returns she had severe back pain was in the hospital and returned initially had a CT scan that showed likely old or subacute L1 fracture.  She had significant increased pain and MRI which showed acute L1 fracture involving the inferior body without significant loss of anterior height.  She already had multi-factorial severe stenosis at L2-3 and L3-4 as well as severe foraminal stenosis on the left at L4-5 and L5-S1.  Previous hip fractures right and left in 2014 2017.  She is ambulating short distances with a walker.  She has a brace on from the hospital.  They discussed vertebroplasty which I told her was  not a good idea and literature recommends against it in the acute setting like she has. Review of Systems 14 point review of system positive hypertension neuropathy diabetes previous hip fractures, coronary artery disease, cardiomyopathy gram-negative infections, postop left hip fracture infection ultimately healed otherwise noncontributory as it pertains HPI.   Objective: Vital Signs: Ht 5\' 9"  (1.753 m)    Wt 188 lb (85.3 kg)    BMI 27.76 kg/m   Physical Exam Constitutional:      Appearance: She is well-developed.  HENT:     Head: Normocephalic.     Right Ear: External ear normal.     Left Ear: External ear normal.  Eyes:     Pupils: Pupils are equal, round, and reactive to light.  Neck:     Thyroid: No thyromegaly.     Trachea: No tracheal deviation.  Cardiovascular:     Rate and Rhythm: Normal rate.  Pulmonary:     Effort: Pulmonary effort is normal.  Abdominal:     Palpations: Abdomen is soft.  Skin:    General: Skin is warm and dry.  Neurological:     Mental Status: She is alert and oriented to person, place, and time.  Psychiatric:        Behavior: Behavior normal.     Ortho Exam patient  is in a TLSO brace.  Anterior tib gastrocsoleus is intact.  States that her legs are intact.  Specialty Comments:  No specialty comments available.  Imaging: No results found.   PMFS History: Patient Active Problem List   Diagnosis Date Noted   Osteoporosis 07/22/2018   Closed compression fracture of L1 vertebra ([place]) 07/19/2018   Closed compression fracture of body of L1 vertebra (HCC)    Closed L1 vertebral fracture ([place]) 07/18/2018   Cardiomyopathy ([place]) 04/07/2018   Hyperglycemia 10/02/2016   Foot ulcer ([place]) 07/24/2016   Gram-negative infection    CKD (chronic kidney disease) stage 3, GFR 30-59 ml/min (HCC)    Postoperative anemia due to acute blood loss 01/04/2016   Left hip postoperative wound infection 01/02/2016   CAD S/P percutaneous coronary  angioplasty 16/12/9602   Acute systolic CHF (congestive heart failure) ([place]) 05/20/2015   Insulin dependent diabetes mellitus with complications ([place]) 54/10/8117   Acute kidney injury superimposed on chronic kidney disease ([place]) 05/11/2015   PCP NOTES >>>>>>>>>>>>>>>> 12/28/2014   Memory loss 05/17/2014   Anxiety and depression, PCP notes  11/24/2013   Severe obesity (BMI >= 40) ([place]) 07/14/2013   Encounter for therapeutic drug monitoring 06/02/2013   History of encephalitis 11/18/2012   Intertrochanteric fracture of right hip ([place]) 07/13/2012   GERD (gastroesophageal reflux disease) 10/05/2011   Seizure disorder ([place]) 05/24/2011   Annual physical exam >>>>>>>>>>>>>>>>>>>>>>>>>>>>>>>.. 02/10/2011   Atrial fibrillation ([place])    LUNG NODULE 09/01/2006   Hyperlipidemia 03/27/2006   Hereditary and idiopathic peripheral neuropathy 03/27/2006   RETINOPATHY, 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   Essential hypertension 03/27/2006   Osteopenia 03/27/2006   Past Medical History:  Diagnosis Date   Atrial fibrillation ([place])    SVT dx 2007, cath 2007 mild CAD, then had an cardioversion, ablation; still on coumadin , has occ palpitation, EKG 03-2010 NSR   Blindness of left eye    CKD (chronic kidney disease), stage III ([place]) 06/16/2016   [name] 06/17/2016   DIABETES MELLITUS, TYPE II 03/27/2006   dr [name]   Eye muscle weakness    Right eye weakness after cataract surgery   GERD (gastroesophageal reflux disease) 10/05/2011   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   HYPERLIPIDEMIA 03/27/2006   HYPERTENSION 03/27/2006   Intertrochanteric fracture of right hip ([place]) 07/13/2012   LUNG NODULE 09/01/2006   Excision, Bx Benighn   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"   Osteopenia 2004   Dexa 2004 showed Osteopenia, DEXA 03/2007 normal   Osteopenia    Other chronic cystitis with hematuria    Pelvic fracture ([place]) 06/16/2016   S/P fall   Recurrent urinary tract infection     Seeing Urology   RETINOPATHY, 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   Seizures ([place])    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[place]) 1/47/8295    Family History  Problem Relation Age of Onset   Diabetes Father    Heart attack Father 40   Diabetes Sister    Drug abuse Son    Cancer Neg Hx        no hx of colon or breast cancer    Past Surgical History:  Procedure Laterality Date   ANKLE FRACTURE SURGERY Bilateral 08/2015   "right one was in 2 places"   CARDIAC CATHETERIZATION N/A 05/17/2015   Procedure: Left Heart Cath and Coronary Angiography;  Surgeon: Jettie Booze, MD;  Location: [facility];  Service: Cardiovascular;  Laterality: N/A;   CARDIAC CATHETERIZATION N/A 05/17/2015   Procedure: Coronary  Balloon Angioplasty;  Surgeon: Jettie Booze, MD;  Location: [facility];  Service: Cardiovascular;  Laterality: N/A;   FEMUR IM NAIL Right 07/15/2012   Procedure: INTRAMEDULLARY (IM) NAIL HIP;  Surgeon: Johnny Bridge, MD;  Location: [facility];  Service: Orthopedics;  Laterality: Right;   FEMUR IM NAIL Left 12/02/2015   Procedure: INTRAMEDULLARY (IM) NAIL FEMORAL;  Surgeon: [name], MD;  Location: [facility];  Service: Orthopedics;  Laterality: Left;   FRACTURE SURGERY     INCISION AND DRAINAGE HIP Left 01/03/2016   Procedure: IRRIGATION AND DEBRIDEMENT HIP;  Surgeon: [name], MD;  Location: [facility];  Service: Orthopedics;  Laterality: Left;   SHOULDER SURGERY Bilateral    "don't remember which side"   Social History   Occupational History   Occupation: retired     Fish farm manager: RETIRED  Tobacco Use   Smoking status: Former Smoker    Types: Cigarettes    Last attempt to quit: 03/02/1978    Years since quitting: 40.4   Smokeless tobacco: Never Used  Substance and Sexual Activity   Alcohol use: No    Alcohol/week: 0.0 standard drinks   Drug use: No   Sexual activity: Not Currently

## 2018-07-26 NOTE — Telephone Encounter (Signed)
Copied from [place] [id number]. Topic: Quick Communication - Home Health Verbal Orders >> Jul 26, 2018  1:42 PM Jeri Cos wrote: Caller/Agency: Geannie Risen Number: [phone] Requesting OT/PT/Skilled Nursing/Social Work/Speech Therapy: OT Frequency: 1x 2wks / 2x 2wks

## 2018-07-26 NOTE — Telephone Encounter (Signed)
Spoke w/ [place]- verbal orders given.

## 2018-07-26 NOTE — Patient Outreach (Signed)
[facility]) Care Management  07/26/2018  [name] [name] 1940/08/26 [id number]    EMMI-General Discharge RED ON EMMI ALERT Day # 1 Date: 07/22/2018 Red Alert Reason: "Transporrtation to follow-up? No"   Outreach attempt # 1 to patient. Spoke with patient who voices she is doing well. She denies any acute issues or concerns at this time. Reviewed and addressed red alert. Patient voices she did not fully understand the question and how it was asked. She was able to confirm that she has supportive spouse in the home who is able to drive her to appts. She had PCP appt on 07/21/2018. She goes this afternoon for ortho appt and spouse will be taking her. Patient confirms that she has all her meds in the home and no current issues or concerns regarding them. [place] services in place and has already been out to see patient and someone coming again today to visit with her. She denies any further RN CM needs or concerns at this time. Advised patient that they would get one more automated EMMI-GENERAL post discharge calls to assess how they are doing following recent hospitalization and will receive a call from a nurse if any of their responses were abnormal. Patient voiced understanding and was appreciative of f/u call.     Plan: RN CM will close case as no further interventions needed at this time.  Enzo Montgomery, RN,BSN,CCM [place] Management Telephonic Care Management Coordinator Direct Phone: [phone] Toll Free: [phone] Fax: [phone]

## 2018-07-26 NOTE — Telephone Encounter (Signed)
Summary: Constipation due to pain meds   Pt has been constipated for a week due to her taking pain medications. Pt has tried stool softner but that has not worked. Husband would like to know what can his wife take to relieve this issue.      Call to patient- she is at follow up at ortho now- granddaughter spoke to me and will have them call when they return. Reported from granddaughter- it has been 10 days since BM.

## 2018-07-27 ENCOUNTER — Telehealth: Payer: Self-pay

## 2018-07-27 NOTE — Telephone Encounter (Signed)
Spoke w/ Pt's husband Jenny Reichmann, he informed that Pt was able to pass a bowel movement last night. Informed of recommendations in case she becomes constipated again. [name] verbalized understanding.

## 2018-07-27 NOTE — Telephone Encounter (Signed)
Please advise 

## 2018-07-27 NOTE — Telephone Encounter (Signed)
Drink plenty fluids Continue with OTC stool softeners Start MiraLAX 17 g daily with fluids, might change to every other day if possible after 1 week.

## 2018-07-27 NOTE — Telephone Encounter (Signed)

## 2018-07-28 ENCOUNTER — Other Ambulatory Visit: Payer: Self-pay

## 2018-07-28 ENCOUNTER — Ambulatory Visit (INDEPENDENT_AMBULATORY_CARE_PROVIDER_SITE_OTHER): Payer: Medicare Other | Admitting: Pharmacist

## 2018-07-28 DIAGNOSIS — I482 Chronic atrial fibrillation, unspecified: Secondary | ICD-10-CM | POA: Diagnosis not present

## 2018-07-28 DIAGNOSIS — I480 Paroxysmal atrial fibrillation: Secondary | ICD-10-CM | POA: Diagnosis not present

## 2018-07-28 DIAGNOSIS — Z5181 Encounter for therapeutic drug level monitoring: Secondary | ICD-10-CM

## 2018-07-28 LAB — POCT INR: INR: 2.6 (ref 2.0–3.0)

## 2018-07-30 ENCOUNTER — Telehealth: Payer: Self-pay | Admitting: Cardiology

## 2018-07-30 NOTE — Telephone Encounter (Signed)
Spoke with patient regarding recent office appt 5/26 and the possbile exposure to Covid.  Offered testing patient refused.

## 2018-08-01 ENCOUNTER — Other Ambulatory Visit: Payer: Self-pay

## 2018-08-01 ENCOUNTER — Other Ambulatory Visit (INDEPENDENT_AMBULATORY_CARE_PROVIDER_SITE_OTHER): Payer: Medicare Other

## 2018-08-01 DIAGNOSIS — E870 Hyperosmolality and hypernatremia: Secondary | ICD-10-CM

## 2018-08-01 DIAGNOSIS — N189 Chronic kidney disease, unspecified: Secondary | ICD-10-CM

## 2018-08-01 NOTE — Addendum Note (Signed)
Addended by: Caffie Pinto on: 08/01/2018 01:21 PM   Modules accepted: Orders

## 2018-08-02 ENCOUNTER — Other Ambulatory Visit: Payer: Medicare Other

## 2018-08-02 LAB — BASIC METABOLIC PANEL
BUN: 26 mg/dL — ABNORMAL HIGH (ref 6–23)
CO2: 30 mEq/L (ref 19–32)
Calcium: 9.3 mg/dL (ref 8.4–10.5)
Chloride: 99 mEq/L (ref 96–112)
Creatinine, Ser: 1.63 mg/dL — ABNORMAL HIGH (ref 0.40–1.20)
GFR: 30.62 mL/min — ABNORMAL LOW (ref >60.00)
Glucose, Bld: 108 mg/dL — ABNORMAL HIGH (ref 70–99)
Potassium: 4.3 mEq/L (ref 3.5–5.1)
Sodium: 138 mEq/L (ref 135–145)

## 2018-08-03 LAB — SODIUM, URINE, RANDOM: Sodium, Ur: 79 mmol/L (ref 28–272)

## 2018-08-07 IMAGING — CR DG CHEST 1V PORT
1 series · 1 of 1 positions shown · non-contrast
Comparison: 05/20/2015

CLINICAL DATA: Known hip fracture. Pre-op image of chest. Pt denies
all chest complaints at this time. Hx HTN controlled with medication
and multiple heart and lung conditions.

EXAM:
PORTABLE CHEST 1 VIEW

[AP]
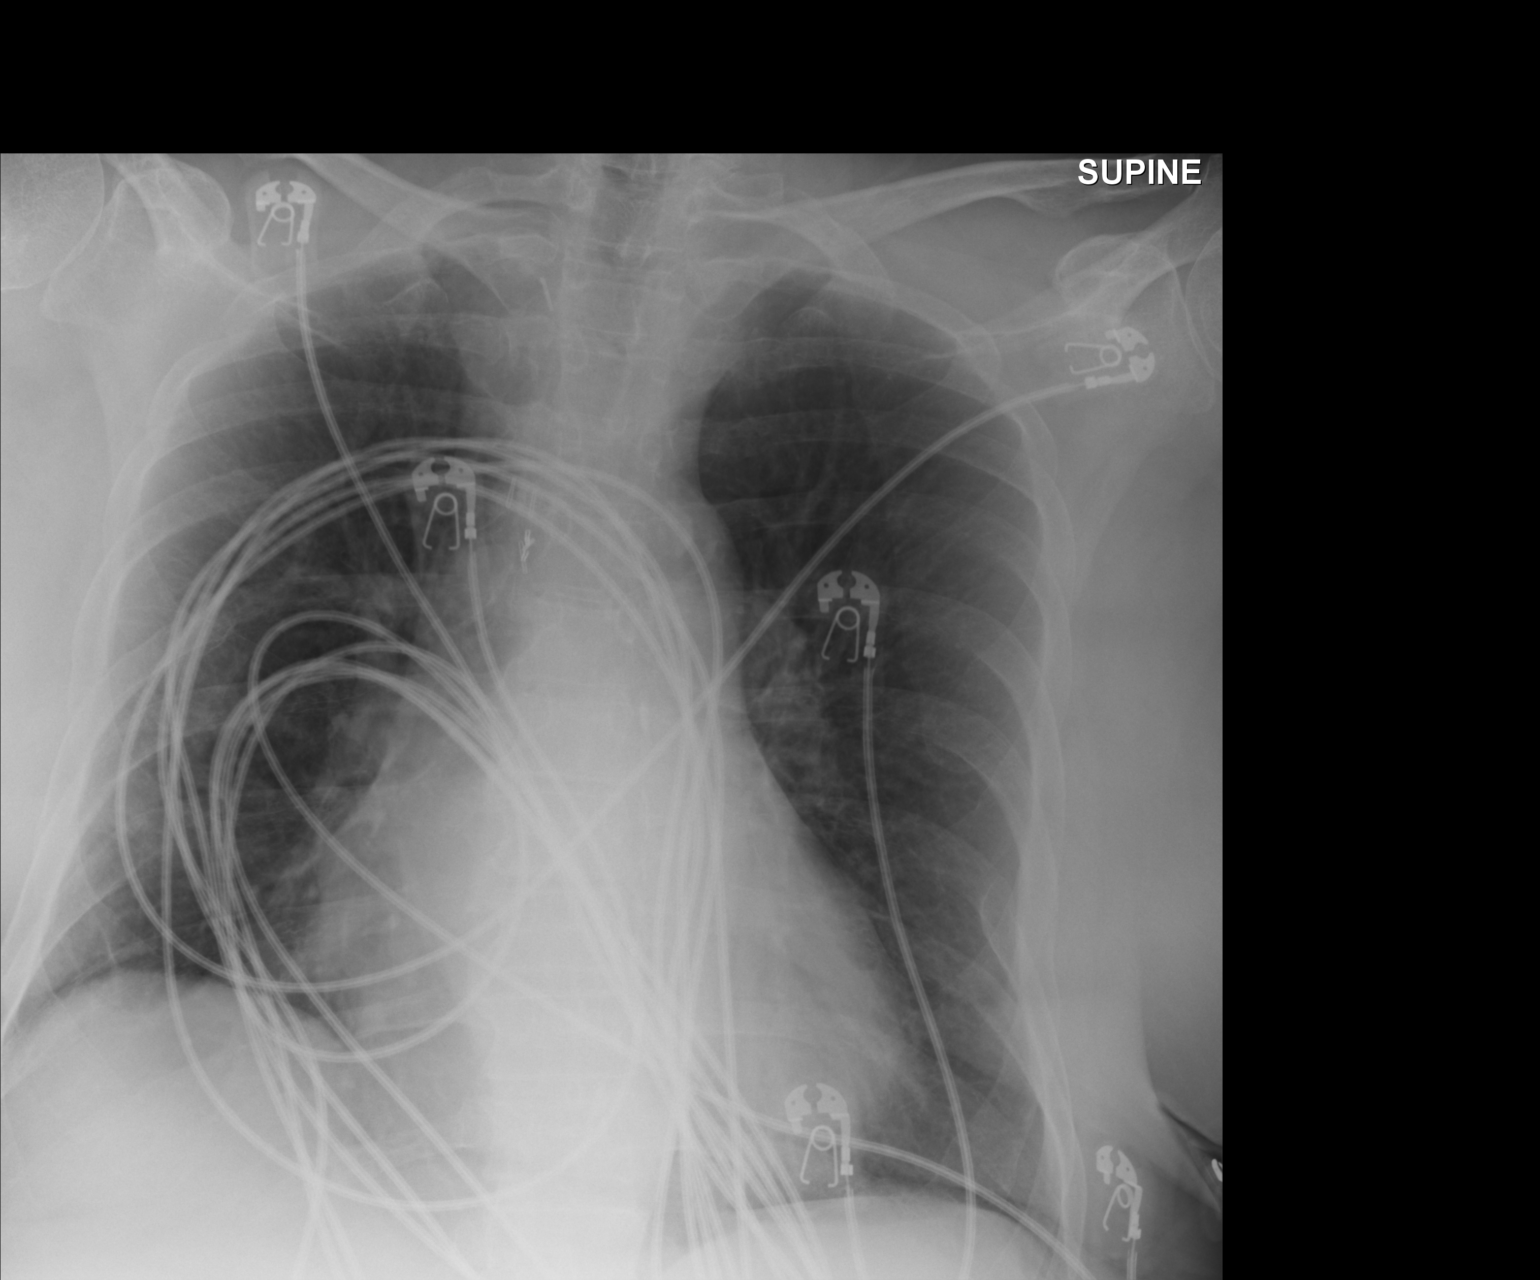

[1 of 1 positions shown; findings below may reference images not displayed]

FINDINGS: Cardiac silhouette is mildly enlarged. No mediastinal or hilar
masses. No evidence of adenopathy.

Clear lungs.  No pleural effusion or pneumothorax.

Bony thorax is demineralized. There is an old healed rib fracture on
the left.
IMPRESSION: No acute cardiopulmonary disease.

## 2018-08-07 IMAGING — DX DG HIP (WITH OR WITHOUT PELVIS) 2-3V*L*
4 series · 4 of 4 positions shown · non-contrast
Comparison: No priors.

CLINICAL DATA: 74-year-old female with history of trauma from a
fall in the bathroom earlier today complaining of left-sided hip
pain.

EXAM:
DG HIP (WITH OR WITHOUT PELVIS) 2-3V LEFT

[pelvis ap]
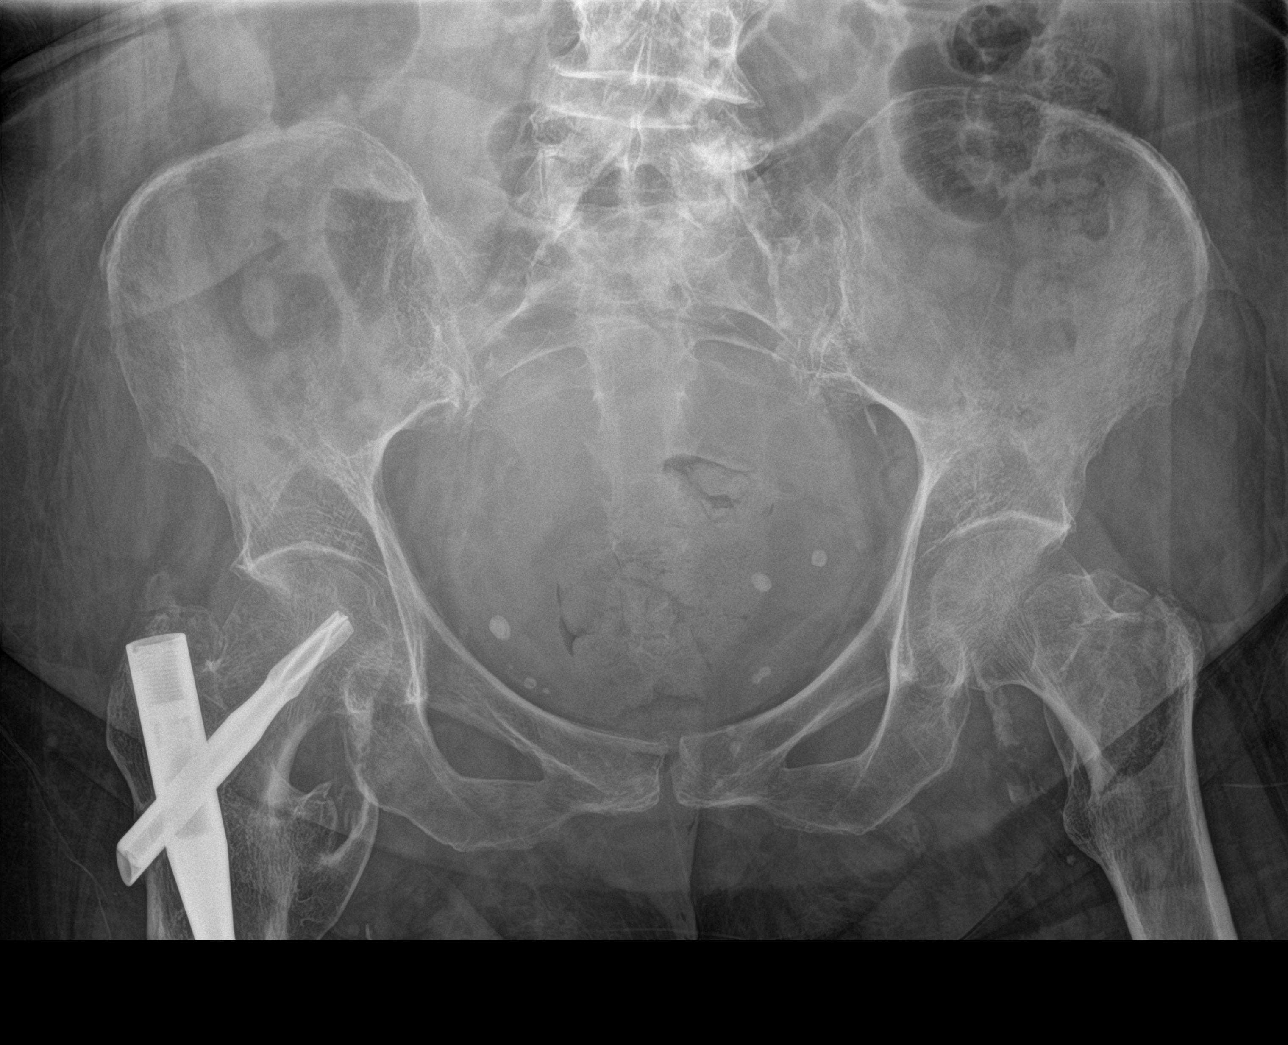

[hip ap (1 of 2)]
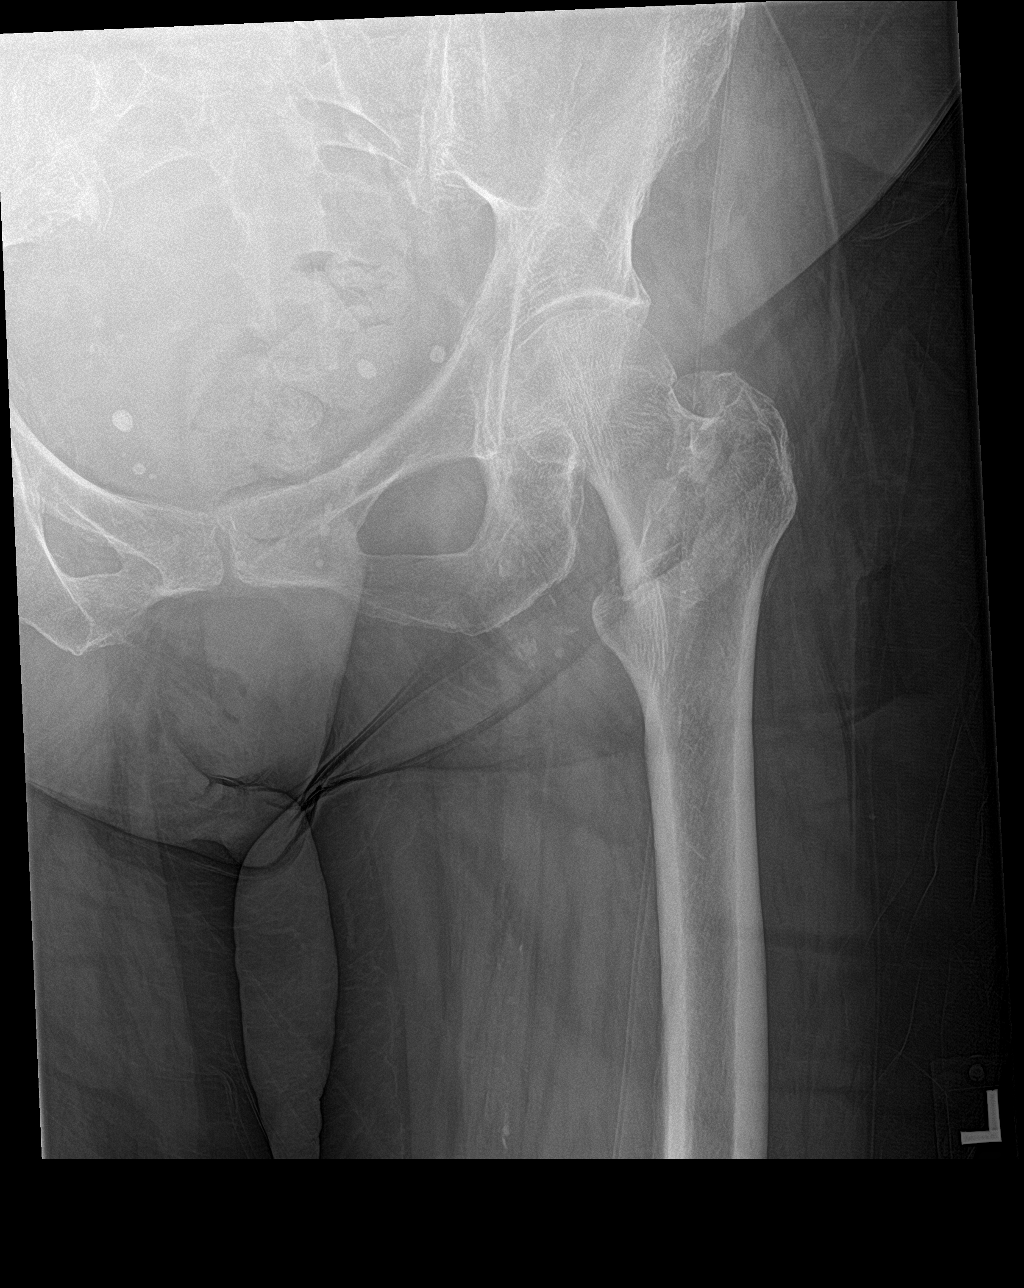

[hip lat]
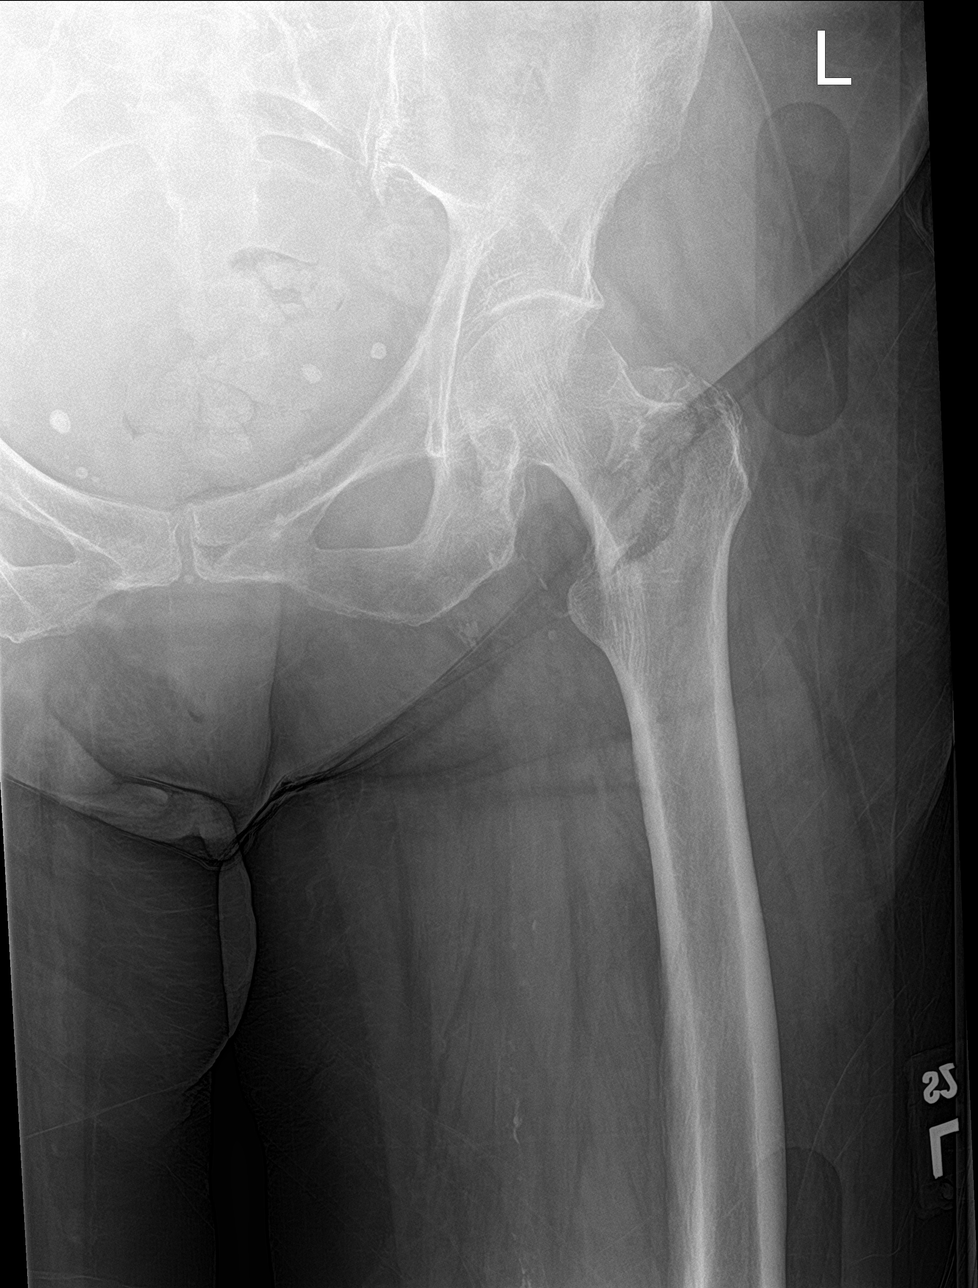

[hip ap (2 of 2)]
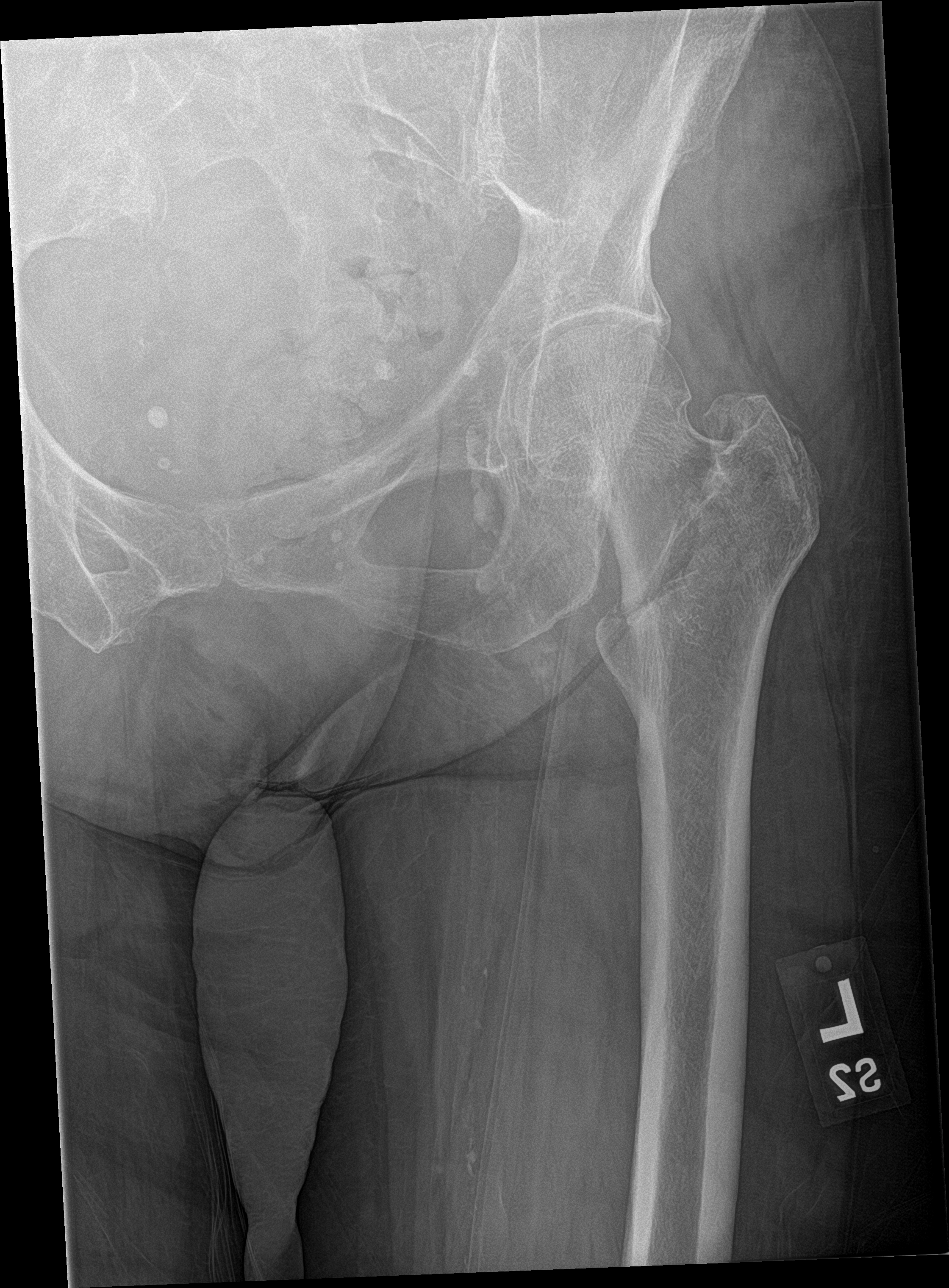

[4 of 4 positions shown; findings below may reference images not displayed]

FINDINGS: Minimally displaced intertrochanteric left hip fracture. Left hip is
located. No acute displaced fracture of the bony pelvis.
Postoperative changes of gamma nail fixation are noted in the right
femoral neck where there is a healed intertrochanteric hip fracture.
IMPRESSION: 1. Nondisplaced intertrochanteric hip fracture of the left hip.

## 2018-08-08 ENCOUNTER — Telehealth: Payer: Self-pay

## 2018-08-08 DIAGNOSIS — Z7901 Long term (current) use of anticoagulants: Secondary | ICD-10-CM

## 2018-08-08 DIAGNOSIS — N390 Urinary tract infection, site not specified: Secondary | ICD-10-CM | POA: Diagnosis not present

## 2018-08-08 DIAGNOSIS — I5020 Unspecified systolic (congestive) heart failure: Secondary | ICD-10-CM | POA: Diagnosis not present

## 2018-08-08 DIAGNOSIS — E1122 Type 2 diabetes mellitus with diabetic chronic kidney disease: Secondary | ICD-10-CM

## 2018-08-08 DIAGNOSIS — I2510 Atherosclerotic heart disease of native coronary artery without angina pectoris: Secondary | ICD-10-CM | POA: Diagnosis not present

## 2018-08-08 DIAGNOSIS — I130 Hypertensive heart and chronic kidney disease with heart failure and stage 1 through stage 4 chronic kidney disease, or unspecified chronic kidney disease: Secondary | ICD-10-CM

## 2018-08-08 DIAGNOSIS — Z794 Long term (current) use of insulin: Secondary | ICD-10-CM

## 2018-08-08 DIAGNOSIS — N183 Chronic kidney disease, stage 3 (moderate): Secondary | ICD-10-CM

## 2018-08-08 DIAGNOSIS — Z7982 Long term (current) use of aspirin: Secondary | ICD-10-CM

## 2018-08-08 IMAGING — CR DG ABD PORTABLE 1V
1 series · 1 of 1 positions shown · non-contrast
Comparison: CT abdomen pelvis 05/20/2015.

CLINICAL DATA: Nausea and vomiting postoperatively.

EXAM:
PORTABLE ABDOMEN - 1 VIEW

[AP]
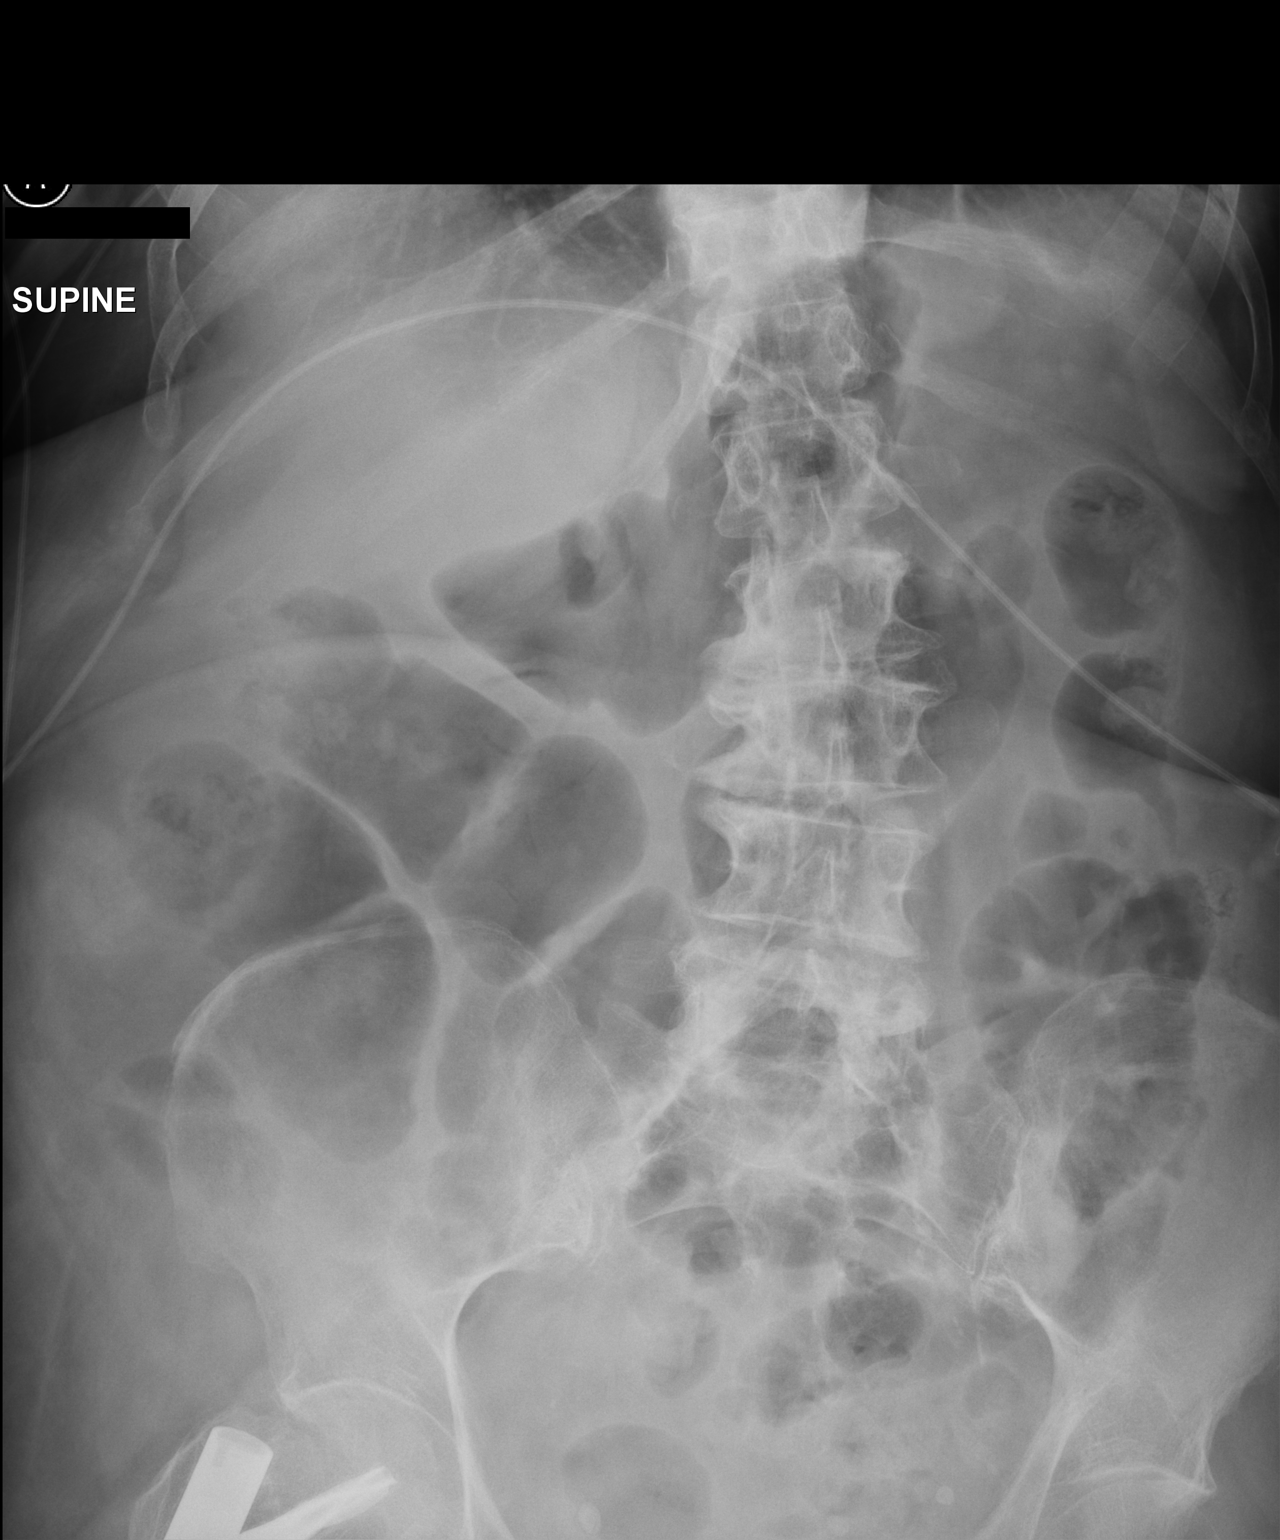

[1 of 1 positions shown; findings below may reference images not displayed]

FINDINGS: Gaseous distension of small bowel and colon. No unexpected
radiopaque calculi.
IMPRESSION: Bowel-gas pattern suggests a postoperative ileus.

## 2018-08-08 NOTE — Telephone Encounter (Signed)
Plan of care signed and faxed back to [facility] at [phone]. Form sent for scanning.

## 2018-08-19 ENCOUNTER — Other Ambulatory Visit: Payer: Self-pay | Admitting: Internal Medicine

## 2018-08-26 ENCOUNTER — Encounter: Payer: Self-pay | Admitting: Orthopaedic Surgery

## 2018-08-26 ENCOUNTER — Ambulatory Visit (INDEPENDENT_AMBULATORY_CARE_PROVIDER_SITE_OTHER): Payer: Medicare Other | Admitting: Orthopaedic Surgery

## 2018-08-26 ENCOUNTER — Ambulatory Visit (INDEPENDENT_AMBULATORY_CARE_PROVIDER_SITE_OTHER): Payer: Medicare Other

## 2018-08-26 ENCOUNTER — Other Ambulatory Visit: Payer: Self-pay

## 2018-08-26 VITALS — Ht 69.0 in | Wt 188.0 lb

## 2018-08-26 DIAGNOSIS — I2510 Atherosclerotic heart disease of native coronary artery without angina pectoris: Secondary | ICD-10-CM

## 2018-08-26 DIAGNOSIS — S32018A Other fracture of first lumbar vertebra, initial encounter for closed fracture: Secondary | ICD-10-CM

## 2018-08-26 DIAGNOSIS — Z9861 Coronary angioplasty status: Secondary | ICD-10-CM

## 2018-08-26 NOTE — Progress Notes (Signed)
Office Visit Note   Patient: [name] [name]           Date of Birth: 03/26/1940           MRN: [id number] Visit Date: 08/26/2018              Requested by: Colon Branch, MD [address] [place],  [place] [address] PCP: Colon Branch, MD   Assessment & Plan: Visit Diagnoses:  1. Other closed fracture of first lumbar vertebra, initial encounter [facility])     Plan: Patient states her back is feeling good she is walking out of the neighborhood goes down a few houses sits for a minute or 2 and then comes back.  She is taking calcitonin nasal spray also Fosamax.  Calcium and vitamin D.  She is having minimal discomfort and I will check her back in 1 year.  She will gradually wean her way out of the corset and gradually increase her walking distance to improve her stamina.  Repeat lumbar x-ray on return in 1 year.  Follow-Up Instructions: No follow-ups on file.   Orders:  Orders Placed This Encounter  Procedures  . XR Lumbar Spine 2-3 Views   No orders of the defined types were placed in this encounter.     Procedures: No procedures performed   Clinical Data: No additional findings.   Subjective: Chief Complaint  Patient presents with  . Lower Back - Fracture, Follow-up    HPI patient returns states her back is feeling good she is not really having any pain she is wearing her corset she is walking and is been taking her Fosamax and calcitonin nasal spray also vitamin D and she is adding and calcium starting today.  She states the pain from her L1 compression fracture resolved couple weeks ago and now feels good.  She is noted that she has been week but is gradually walking more outside and just gets tired and sits down and rest.  Review of Systems reviewed updated unchanged from May office visit.   Objective: Vital Signs: Ht 5\' 9"  (1.753 m)   Wt 188 lb (85.3 kg)   BMI 27.76 kg/m   Physical Exam Constitutional:      Appearance: She is well-developed.   HENT:     Head: Normocephalic.     Right Ear: External ear normal.     Left Ear: External ear normal.  Eyes:     Pupils: Pupils are equal, round, and reactive to light.  Neck:     Thyroid: No thyromegaly.     Trachea: No tracheal deviation.  Cardiovascular:     Rate and Rhythm: Normal rate.  Pulmonary:     Effort: Pulmonary effort is normal.  Abdominal:     Palpations: Abdomen is soft.  Skin:    General: Skin is warm and dry.  Neurological:     Mental Status: She is alert and oriented to person, place, and time.  Psychiatric:        Behavior: Behavior normal.     Ortho Exam sensation over her thighs are intact good hip flexion.  She is able to ambulate.  Specialty Comments:  No specialty comments available.  Imaging: No results found.   PMFS History: Patient Active Problem List   Diagnosis Date Noted  . Osteoporosis 07/22/2018  . Closed compression fracture of L1 vertebra ([place]) 07/19/2018  . Closed compression fracture of body of L1 vertebra ([place])   . Closed L1  vertebral fracture ([place]) 07/18/2018  . Cardiomyopathy ([place]) 04/07/2018  . Hyperglycemia 10/02/2016  . Foot ulcer ([place]) 07/24/2016  . Gram-negative infection   . CKD (chronic kidney disease) stage 3, GFR 30-59 ml/min (HCC)   . Postoperative anemia due to acute blood loss 01/04/2016  . Left hip postoperative wound infection 01/02/2016  . CAD S/P percutaneous coronary angioplasty 05/20/2015  . Acute systolic CHF (congestive heart failure) ([place]) 05/20/2015  . Insulin dependent diabetes mellitus with complications ([place]) 63/03/6008  . Acute kidney injury superimposed on chronic kidney disease ([place]) 05/11/2015  . PCP NOTES >>>>>>>>>>>>>>>> 12/28/2014  . Memory loss 05/17/2014  . Anxiety and depression, PCP notes  11/24/2013  . Severe obesity (BMI >= 40) ([place]) 07/14/2013  . Encounter for therapeutic drug monitoring 06/02/2013  . History of encephalitis 11/18/2012  . Intertrochanteric fracture of right hip  ([place]) 07/13/2012  . GERD (gastroesophageal reflux disease) 10/05/2011  . Seizure disorder ([place]) 05/24/2011  . Annual physical exam >>>>>>>>>>>>>>>>>>>>>>>>>>>>>>>.. 02/10/2011  . Atrial fibrillation ([place])   . 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 Hyperlipidemia 03/27/2006  . Hereditary and idiopathic peripheral neuropathy 03/27/2006  . 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 Essential hypertension 03/27/2006  . Osteopenia 03/27/2006   Past Medical History:  Diagnosis Date  . Atrial fibrillation ([place])    SVT dx 2007, cath 2007 mild CAD, then had an cardioversion, ablation; still on coumadin , has occ palpitation, EKG 03-2010 NSR  . Blindness of left eye   . CKD (chronic kidney disease), stage III ([place]) 06/16/2016   [name] 06/17/2016  . DIABETES MELLITUS, TYPE II 03/27/2006   dr [name]  . Eye muscle weakness    Right eye weakness after cataract surgery  . GERD (gastroesophageal reflux disease) 10/05/2011  . 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 HYPERLIPIDEMIA 03/27/2006  . HYPERTENSION 03/27/2006  . Intertrochanteric fracture of right hip ([place]) 07/13/2012  . 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 Osteopenia 2004   Dexa 2004 showed Osteopenia, DEXA 03/2007 normal  . Osteopenia   . Other chronic cystitis with hematuria   . Pelvic fracture ([place]) 06/16/2016   S/P fall  . Recurrent urinary tract infection    Seeing Urology  . 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 Seizures ([place])   . 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[place]) 9/32/3557    Family History  Problem Relation Age of Onset  . Diabetes Father   . Heart attack Father 76  . Diabetes Sister   . Drug abuse Son   . Cancer Neg Hx        no hx of colon or breast cancer    Past Surgical History:  Procedure Laterality Date  . ANKLE FRACTURE SURGERY Bilateral 08/2015   "right one was in 2 places"  . CARDIAC CATHETERIZATION N/A 05/17/2015   Procedure: Left Heart Cath and Coronary Angiography;  Surgeon:  Jettie Booze, MD;  Location: [facility];  Service: Cardiovascular;  Laterality: N/A;  . CARDIAC CATHETERIZATION N/A 05/17/2015   Procedure: Coronary Balloon Angioplasty;  Surgeon: Jettie Booze, MD;  Location: [facility];  Service: Cardiovascular;  Laterality: N/A;  . FEMUR IM NAIL Right 07/15/2012   Procedure: INTRAMEDULLARY (IM) NAIL HIP;  Surgeon: Johnny Bridge, MD;  Location: [facility];  Service: Orthopedics;  Laterality: Right;  . FEMUR IM NAIL Left 12/02/2015   Procedure: INTRAMEDULLARY (IM) NAIL FEMORAL;  Surgeon: [name], MD;  Location: [facility];  Service: Orthopedics;  Laterality: Left;  . FRACTURE SURGERY    . INCISION AND DRAINAGE HIP Left 01/03/2016   Procedure: IRRIGATION AND DEBRIDEMENT HIP;  Surgeon: [name], MD;  Location: [facility];  Service: Orthopedics;  Laterality: Left;  . SHOULDER SURGERY Bilateral    "don't remember which side"   Social History   Occupational History  . Occupation: retired     Fish farm manager: RETIRED  Tobacco Use  . Smoking status: Former Smoker    Types: Cigarettes    Quit date: 03/02/1978    Years since quitting: 40.5  . Smokeless tobacco: Never Used  Substance and Sexual Activity  . Alcohol use: No    Alcohol/week: 0.0 standard drinks  . Drug use: No  . Sexual activity: Not Currently

## 2018-09-01 ENCOUNTER — Telehealth: Payer: Self-pay

## 2018-09-01 NOTE — Telephone Encounter (Signed)

## 2018-09-07 ENCOUNTER — Encounter: Payer: Self-pay | Admitting: Internal Medicine

## 2018-09-07 ENCOUNTER — Other Ambulatory Visit: Payer: Self-pay

## 2018-09-07 ENCOUNTER — Ambulatory Visit (HOSPITAL_BASED_OUTPATIENT_CLINIC_OR_DEPARTMENT_OTHER)
Admission: RE | Admit: 2018-09-07 | Discharge: 2018-09-07 | Disposition: A | Discharge status: Home / Self Care | Payer: Medicare Other | Source: Ambulatory Visit | Attending: Internal Medicine | Admitting: Internal Medicine

## 2018-09-07 ENCOUNTER — Ambulatory Visit (INDEPENDENT_AMBULATORY_CARE_PROVIDER_SITE_OTHER): Payer: Medicare Other | Admitting: Internal Medicine

## 2018-09-07 VITALS — BP 147/52 | HR 60 | Temp 97.8°F | Resp 16 | Ht 69.0 in | Wt 185.4 lb

## 2018-09-07 DIAGNOSIS — I10 Essential (primary) hypertension: Secondary | ICD-10-CM | POA: Diagnosis not present

## 2018-09-07 DIAGNOSIS — M25532 Pain in left wrist: Secondary | ICD-10-CM | POA: Insufficient documentation

## 2018-09-07 DIAGNOSIS — I2510 Atherosclerotic heart disease of native coronary artery without angina pectoris: Secondary | ICD-10-CM | POA: Diagnosis not present

## 2018-09-07 DIAGNOSIS — F329 Major depressive disorder, single episode, unspecified: Secondary | ICD-10-CM

## 2018-09-07 DIAGNOSIS — Z9861 Coronary angioplasty status: Secondary | ICD-10-CM

## 2018-09-07 DIAGNOSIS — F419 Anxiety disorder, unspecified: Secondary | ICD-10-CM | POA: Diagnosis not present

## 2018-09-07 DIAGNOSIS — M8000XD Age-related osteoporosis with current pathological fracture, unspecified site, subsequent encounter for fracture with routine healing: Secondary | ICD-10-CM | POA: Diagnosis not present

## 2018-09-07 DIAGNOSIS — E785 Hyperlipidemia, unspecified: Secondary | ICD-10-CM

## 2018-09-07 DIAGNOSIS — F32A Depression, unspecified: Secondary | ICD-10-CM

## 2018-09-07 NOTE — Patient Instructions (Addendum)
Per our records you are due for an eye exam. Please contact your eye doctor to schedule an appointment. Please have them send copies of your office visit notes to [place]. Our fax number is [phone].   GO TO THE FRONT DESK Schedule your next appointment for a checkup in 4 months   STOP BY THE FIRST FLOOR:  get the XR of the left wrist  Get a flu shot earlier this year  For wrist pain: Ice, Tylenol

## 2018-09-07 NOTE — Progress Notes (Signed)
Subjective:    Patient ID: [name] [name], female    DOB: 1941/02/05, 78 y.o.   MRN: [id number]  DOS:  09/07/2018 Type of visit - description: rov History of L1 vertebral fracture, doing well, pain is very manageable, she is back walking daily Osteoporosis: On Fosamax HTN: Good compliance with medication, ambulatory BPs normal Reports pain at the left wrist for few weeks, no history of fall or swelling. Pain increases with thumb motion  Review of Systems No fever chills No chest pain no difficulty breathing  Past Medical History:  Diagnosis Date  . Atrial fibrillation ([place])    SVT dx 2007, cath 2007 mild CAD, then had an cardioversion, ablation; still on coumadin , has occ palpitation, EKG 03-2010 NSR  . Blindness of left eye   . CKD (chronic kidney disease), stage III ([place]) 06/16/2016   [name] 06/17/2016  . DIABETES MELLITUS, TYPE II 03/27/2006   dr [name]  . Eye muscle weakness    Right eye weakness after cataract surgery  . GERD (gastroesophageal reflux disease) 10/05/2011  . 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 HYPERLIPIDEMIA 03/27/2006  . HYPERTENSION 03/27/2006  . Intertrochanteric fracture of right hip ([place]) 07/13/2012  . 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 Osteopenia 2004   Dexa 2004 showed Osteopenia, DEXA 03/2007 normal  . Osteopenia   . Other chronic cystitis with hematuria   . Pelvic fracture ([place]) 06/16/2016   S/P fall  . Recurrent urinary tract infection    Seeing Urology  . 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 Seizures ([place])   . 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[place]) 5/57/3220    Past Surgical History:  Procedure Laterality Date  . ANKLE FRACTURE SURGERY Bilateral 08/2015   "right one was in 2 places"  . CARDIAC CATHETERIZATION N/A 05/17/2015   Procedure: Left Heart Cath and Coronary Angiography;  Surgeon: Jettie Booze, MD;  Location: [facility];  Service: Cardiovascular;  Laterality: N/A;  . CARDIAC  CATHETERIZATION N/A 05/17/2015   Procedure: Coronary Balloon Angioplasty;  Surgeon: Jettie Booze, MD;  Location: [facility];  Service: Cardiovascular;  Laterality: N/A;  . FEMUR IM NAIL Right 07/15/2012   Procedure: INTRAMEDULLARY (IM) NAIL HIP;  Surgeon: Johnny Bridge, MD;  Location: [facility];  Service: Orthopedics;  Laterality: Right;  . FEMUR IM NAIL Left 12/02/2015   Procedure: INTRAMEDULLARY (IM) NAIL FEMORAL;  Surgeon: [name], MD;  Location: [facility];  Service: Orthopedics;  Laterality: Left;  . FRACTURE SURGERY    . INCISION AND DRAINAGE HIP Left 01/03/2016   Procedure: IRRIGATION AND DEBRIDEMENT HIP;  Surgeon: [name], MD;  Location: [facility];  Service: Orthopedics;  Laterality: Left;  . SHOULDER SURGERY Bilateral    "don't remember which side"    Social History   Socioeconomic History  . Marital status: Married    Spouse name: Jenny Reichmann  . Number of children: 1  . Years of education: College  . Highest education level: Not on file  Occupational History  . Occupation: retired     Fish farm manager: RETIRED  Social Needs  . Financial resource strain: Not on file  . Food insecurity    Worry: Not on file    Inability: Not on file  . Transportation needs    Medical: Not on file    Non-medical: Not on file  Tobacco Use  . Smoking status: Former Smoker  Types: Cigarettes    Quit date: 03/02/1978    Years since quitting: 40.5  . Smokeless tobacco: Never Used  Substance and Sexual Activity  . Alcohol use: No    Alcohol/week: 0.0 standard drinks  . Drug use: No  . Sexual activity: Not Currently  Lifestyle  . Physical activity    Days per week: Not on file    Minutes per session: Not on file  . Stress: Not on file  Relationships  . Social Herbalist on phone: Not on file    Gets together: Not on file    Attends religious service: Not on file    Active member of club or organization: Not on file    Attends meetings of clubs or organizations: Not on file     Relationship status: Not on file  . Intimate partner violence    Fear of current or ex partner: Not on file    Emotionally abused: Not on file    Physically abused: Not on file    Forced sexual activity: Not on file  Other Topics Concern  . Not on file  Social History Narrative   Patient lives at home spouse. Uses a walker consistently.   Moved from [place] 2004   1 son, problems w/ drugs, passed away 2016/03/02-- OD          Allergies as of 09/07/2018      Reactions   Procaine Hcl Anaphylaxis   Amoxicillin Itching   Did it involve swelling of the face/tongue/throat, SOB, or low BP? No Did it involve sudden or severe rash/hives, skin peeling, or any reaction on the inside of your mouth or nose? No Did you need to seek medical attention at a hospital or doctor's office? No When did it last happen?unk If all above answers are "NO", may proceed with cephalosporin use.      Medication List       Accurate as of September 07, 2018  9:36 AM. If you have any questions, ask your nurse or doctor.        alendronate 70 MG tablet Commonly known as: FOSAMAX Take 1 tablet (70 mg total) by mouth every 7 (seven) days. Take with a full glass of water on an empty stomach.   aspirin EC 81 MG tablet Take 1 tablet (81 mg total) by mouth daily.   atorvastatin 80 MG tablet Commonly known as: LIPITOR Take 1 tablet (80 mg total) by mouth daily.   calcium-vitamin D 500-200 MG-UNIT tablet Commonly known as: OSCAL WITH D Take 1 tablet by mouth daily.   escitalopram 10 MG tablet Commonly known as: LEXAPRO Take 1 tablet (10 mg total) by mouth daily.   furosemide 40 MG tablet Commonly known as: LASIX Take 1 tablet (40 mg total) by mouth 2 (two) times daily as needed for fluid.   HYDROcodone-Acetaminophen 7.5-300 MG Tabs Take 0.5 tablets by mouth 4 (four) times daily as needed.   insulin NPH-regular Human (70-30) 100 UNIT/ML injection Commonly known as: NovoLIN 70/30 ReliOn 31 units with breakfast  and 4 units with evening meal. What changed:   how much to take  how to take this  when to take this   metoprolol succinate 25 MG 24 hr tablet Commonly known as: TOPROL-XL Take 1 tablet (25 mg total) by mouth daily. Take with or immediately following a meal.   MULTIVITAMIN ADULT PO Take 1 tablet by mouth daily with breakfast.   pantoprazole 40 MG tablet Commonly known  as: PROTONIX Take 1 tablet (40 mg total) by mouth daily.   warfarin 2.5 MG tablet Commonly known as: COUMADIN Take as directed by the anticoagulation clinic. If you are unsure how to take this medication, talk to your nurse or doctor. Original instructions: TAKE AS DIRECTED PER COUMADIN CLINIC           Objective:   Physical Exam Musculoskeletal:       Arms:    BP (!) 147/52 (BP Location: Right Arm, Patient Position: Sitting, Cuff Size: Small)   Pulse 60   Temp 97.8 F (36.6 C) (Oral)   Resp 16   Ht 5\' 9"  (1.753 m)   Wt 185 lb 6 oz (84.1 kg)   SpO2 100%   BMI 27.38 kg/m  General:   Well developed, NAD, BMI noted. HEENT:  Normocephalic . Face symmetric, atraumatic Lungs:  CTA B Normal respiratory effort, no intercostal retractions, no accessory muscle use. Heart: Seems regular today,  no murmur.  No pretibial edema bilaterally MSK: Right hand and wrist: Bony changes consistent with DJD Left hand and wrist: Bony changes consistent with DJD, no swelling, redness or puffiness.  See graphic Skin: Not pale. Not jaundice Neurologic:  alert & oriented X3.  Speech normal, gait appropriate for age and unassisted Psych--  Cognition and judgment appear intact.  Cooperative with normal attention span and concentration.  Behavior appropriate. No anxious or depressed appearing.      Assessment    Assessment   DM  + retinopathy, Dr [name] HTN Hyperlipidemia anxiety ativan GERD Elevated creatinine(started after admission 05-2015, had IV contrast) Anemia, normal iron 05-2015, never had a  colonoscopy CV ---NSTEMI 05-2015, cath, angiplasty ---Echo, EF 40 % (05-2015) ---Atrial fibrillation, SVT -- dx 2007, cath 2007 mild CAD. 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), sz episode 08-2015 (?) - HAs Blindness, left eye Right eye weakness after cataract surgery Osteoporosis: s/p DEXA 2004, DEXA 2009 normal, DEXA 2015 @ [facility] -1.8 but had L1 vertebral fracture 2020 H/o lung bx 2008 benign  05-2015- admitted [place] sepsis  08-2015 admitted  B tibular Fx, no surgery, SNF 12-2015:   admitted, hip FX , surgery 01-2016: admitted, hip surgery infex   05-2016: Admitted, fall, pelvic fracture, d/c to home 10-02-16: admitted  UTI, nonketotic hyperglycemic syndrome  PLAN: L1 vertebral fracture: Doing better, not needing narcotics, more active. Osteoporosis: Good compliance with Fosamax HTN: BP slightly elevated today, normal ambulatory BPs, last BMP satisfactory, no change Left wrist pain: Suspect DJD, occult fracture is also possible.  Will get x-ray, recommend ice, Tylenol and avoid NSAIDs.  She is using a brace and that is okay.  Refer to Dr. Lorin Mercy, orthopedics Recommend early flu shot this season. RTC 4 months

## 2018-09-07 NOTE — Progress Notes (Signed)
Pre visit review using our clinic review tool, if applicable. No additional management support is needed unless otherwise documented below in the visit note. 

## 2018-09-08 ENCOUNTER — Ambulatory Visit (INDEPENDENT_AMBULATORY_CARE_PROVIDER_SITE_OTHER): Payer: Medicare Other | Admitting: *Deleted

## 2018-09-08 DIAGNOSIS — I480 Paroxysmal atrial fibrillation: Secondary | ICD-10-CM | POA: Diagnosis not present

## 2018-09-08 DIAGNOSIS — I482 Chronic atrial fibrillation, unspecified: Secondary | ICD-10-CM | POA: Diagnosis not present

## 2018-09-08 DIAGNOSIS — Z5181 Encounter for therapeutic drug level monitoring: Secondary | ICD-10-CM | POA: Diagnosis not present

## 2018-09-08 LAB — POCT INR: INR: 2.8 (ref 2.0–3.0)

## 2018-09-08 NOTE — Patient Instructions (Signed)
Description   Continue taking 1 tablet daily. Recheck INR in 6 weeks.  Call us with any medication changes #[phone] to Coumadin clinic.      

## 2018-09-08 NOTE — Assessment & Plan Note (Signed)
L1 vertebral fracture: Doing better, not needing narcotics, more active. Osteoporosis: Good compliance with Fosamax HTN: BP slightly elevated today, normal ambulatory BPs, last BMP satisfactory, no change Left wrist pain: Suspect DJD, occult fracture is also possible.  Will get x-ray, recommend ice, Tylenol and avoid NSAIDs.  She is using a brace and that is okay.  Refer to Dr. Lorin Mercy, orthopedics Recommend early flu shot this season. RTC 4 months

## 2018-09-14 ENCOUNTER — Other Ambulatory Visit: Payer: Self-pay

## 2018-09-14 ENCOUNTER — Encounter: Payer: Self-pay | Admitting: Orthopaedic Surgery

## 2018-09-14 ENCOUNTER — Ambulatory Visit (INDEPENDENT_AMBULATORY_CARE_PROVIDER_SITE_OTHER): Payer: Medicare Other | Admitting: Orthopaedic Surgery

## 2018-09-14 DIAGNOSIS — Z9861 Coronary angioplasty status: Secondary | ICD-10-CM

## 2018-09-14 DIAGNOSIS — I2510 Atherosclerotic heart disease of native coronary artery without angina pectoris: Secondary | ICD-10-CM

## 2018-09-14 DIAGNOSIS — M19132 Post-traumatic osteoarthritis, left wrist: Secondary | ICD-10-CM | POA: Diagnosis not present

## 2018-09-14 DIAGNOSIS — M19032 Primary osteoarthritis, left wrist: Secondary | ICD-10-CM | POA: Insufficient documentation

## 2018-09-14 NOTE — Progress Notes (Signed)
Office Visit Note   Patient: [name] [name]           Date of Birth: 07-17-1940           MRN: [id number] Visit Date: 09/14/2018              Requested by: Colon Branch, MD [address] [place],  [place] [address] PCP: Colon Branch, MD   Assessment & Plan: Visit Diagnoses:  1. Post-traumatic osteoarthritis of left wrist     Plan: Patient has posttraumatic osteoarthritis left wrist with some radial shortening without angulation.  She has radial scaphoid spurring which is symptomatic.  She can use a wrist brace intermittently.  She is modified using her walker so we will bother her wrist is much.  She can use some Tylenol twice a day as needed since she is not able to take anti-inflammatories due to the chronic Coumadin for her atrial fibrillation.  If she gets a significant flare in her symptoms she could return for possible injection.  Currently were tried and avoid the intra-articular cortisone injection since she does have diabetes and it could increase her blood sugar.  She can follow-up if she has increased symptoms.  Follow-Up Instructions: No follow-ups on file.   Orders:  No orders of the defined types were placed in this encounter.  No orders of the defined types were placed in this encounter.     Procedures: No procedures performed   Clinical Data: No additional findings.   Subjective: Chief Complaint  Patient presents with  . Left Wrist - Pain    HPI 78 year old female is here with left wrist pain for about 4 months.  She has been using a splint on her wrist uses a rolling walker but on the left wrist she is sets her hand up on top of the handle and does not actually grip it. She recalls that she had a wrist fracture around age 49 this was treated with a cast.  X-rays done on 09/07/2018 showed changes consistent with old distal radius extra-articular fracture with shortening of the radius without angulation.  There was degenerative changes at the  wrist with radial scaphoid spurring and joint space narrowing. Review of Systems review of systems updated she has had history of heart failure low ejection fraction atrial fib she is on chronic Coumadin medication cannot take anti-inflammatories otherwise noncontributory 14 point systems as it pertains HPI.   Objective: Vital Signs: There were no vitals taken for this visit.  Physical Exam Constitutional:      Appearance: She is well-developed.  HENT:     Head: Normocephalic.     Right Ear: External ear normal.     Left Ear: External ear normal.  Eyes:     Pupils: Pupils are equal, round, and reactive to light.  Neck:     Thyroid: No thyromegaly.     Trachea: No tracheal deviation.  Cardiovascular:     Rate and Rhythm: Normal rate.  Pulmonary:     Effort: Pulmonary effort is normal.  Abdominal:     Palpations: Abdomen is soft.  Skin:    General: Skin is warm and dry.  Neurological:     Mental Status: She is alert and oriented to person, place, and time.  Psychiatric:        Behavior: Behavior normal.     Ortho Exam Patient has mild tenderness first dorsal compartment significant tenderness over the radial scaphoid joint.  TFCC is  normal.  No triggering of the flexor tendons.  No dorsal tenosynovitis. Specialty Comments:  No specialty comments available.  Imaging: CLINICAL DATA:  Left wrist pain for 2 months without known injury.  EXAM: LEFT WRIST - COMPLETE 3+ VIEW  COMPARISON:  Radiographs of December 16, 2010.  FINDINGS: Stable deformity of distal left radius is noted consistent with old healed fracture. No acute fracture or dislocation is noted. Vascular calcifications are noted. Mild degenerative changes seen involving the radiocarpal joint.  IMPRESSION: No acute abnormality seen in the left wrist.   Electronically Signed   By: Marijo Conception M.D.   PMFS History: Patient Active Problem List   Diagnosis Date Noted  . Osteoarthritis of left  wrist 09/14/2018  . Osteoporosis 07/22/2018  . Closed compression fracture of L1 vertebra ([place]) 07/19/2018  . Closed compression fracture of body of L1 vertebra ([place])   . Closed L1 vertebral fracture ([facility]) 07/18/2018  . Cardiomyopathy ([place]) 04/07/2018  . Hyperglycemia 10/02/2016  . Foot ulcer ([place]) 07/24/2016  . Gram-negative infection   . CKD (chronic kidney disease) stage 3, GFR 30-59 ml/min ([facility])   . Postoperative anemia due to acute blood loss 01/04/2016  . Left hip postoperative wound infection 01/02/2016  . CAD S/P percutaneous coronary angioplasty 05/20/2015  . Acute systolic CHF (congestive heart failure) ([place]) 05/20/2015  . Insulin dependent diabetes mellitus with complications ([place]) 68/12/5724  . Acute kidney injury superimposed on chronic kidney disease ([place]) 05/11/2015  . PCP NOTES >>>>>>>>>>>>>>>> 12/28/2014  . Memory loss 05/17/2014  . Anxiety and depression, PCP notes  11/24/2013  . Severe obesity (BMI >= 40) ([place]) 07/14/2013  . Encounter for therapeutic drug monitoring 06/02/2013  . History of encephalitis 11/18/2012  . Intertrochanteric fracture of right hip ([place]) 07/13/2012  . GERD (gastroesophageal reflux disease) 10/05/2011  . Seizure disorder ([place]) 05/24/2011  . Annual physical exam >>>>>>>>>>>>>>>>>>>>>>>>>>>>>>>.. 02/10/2011  . Atrial fibrillation ([place])   . 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 Hyperlipidemia 03/27/2006  . Hereditary and idiopathic peripheral neuropathy 03/27/2006  . 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 Essential hypertension 03/27/2006   Past Medical History:  Diagnosis Date  . Atrial fibrillation ([place])    SVT dx 2007, cath 2007 mild CAD, then had an cardioversion, ablation; still on coumadin , has occ palpitation, EKG 03-2010 NSR  . Blindness of left eye   . CKD (chronic kidney disease), stage III ([place]) 06/16/2016   [name] 06/17/2016  . DIABETES MELLITUS, TYPE II 03/27/2006   dr [name]  . Eye muscle weakness    Right eye weakness after  cataract surgery  . GERD (gastroesophageal reflux disease) 10/05/2011  . 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 HYPERLIPIDEMIA 03/27/2006  . HYPERTENSION 03/27/2006  . Intertrochanteric fracture of right hip ([place]) 07/13/2012  . 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 Osteopenia 2004   Dexa 2004 showed Osteopenia, DEXA 03/2007 normal  . Osteopenia   . Other chronic cystitis with hematuria   . Pelvic fracture ([place]) 06/16/2016   S/P fall  . Recurrent urinary tract infection    Seeing Urology  . 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 Seizures ([place])   . 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[place]) 04/05/5595    Family History  Problem Relation Age of Onset  . Diabetes Father   . Heart attack Father 63  . Diabetes Sister   . Drug abuse Son   . Cancer Neg Hx  no hx of colon or breast cancer    Past Surgical History:  Procedure Laterality Date  . ANKLE FRACTURE SURGERY Bilateral 08/2015   "right one was in 2 places"  . CARDIAC CATHETERIZATION N/A 05/17/2015   Procedure: Left Heart Cath and Coronary Angiography;  Surgeon: Jettie Booze, MD;  Location: [facility];  Service: Cardiovascular;  Laterality: N/A;  . CARDIAC CATHETERIZATION N/A 05/17/2015   Procedure: Coronary Balloon Angioplasty;  Surgeon: Jettie Booze, MD;  Location: [facility];  Service: Cardiovascular;  Laterality: N/A;  . FEMUR IM NAIL Right 07/15/2012   Procedure: INTRAMEDULLARY (IM) NAIL HIP;  Surgeon: Johnny Bridge, MD;  Location: [facility];  Service: Orthopedics;  Laterality: Right;  . FEMUR IM NAIL Left 12/02/2015   Procedure: INTRAMEDULLARY (IM) NAIL FEMORAL;  Surgeon: [name], MD;  Location: [facility];  Service: Orthopedics;  Laterality: Left;  . FRACTURE SURGERY    . INCISION AND DRAINAGE HIP Left 01/03/2016   Procedure: IRRIGATION AND DEBRIDEMENT HIP;  Surgeon: [name], MD;  Location: [facility];  Service: Orthopedics;  Laterality: Left;  . SHOULDER SURGERY  Bilateral    "don't remember which side"   Social History   Occupational History  . Occupation: retired     Fish farm manager: RETIRED  Tobacco Use  . Smoking status: Former Smoker    Types: Cigarettes    Quit date: 03/02/1978    Years since quitting: 40.5  . Smokeless tobacco: Never Used  Substance and Sexual Activity  . Alcohol use: No    Alcohol/week: 0.0 standard drinks  . Drug use: No  . Sexual activity: Not Currently

## 2018-10-20 ENCOUNTER — Other Ambulatory Visit: Payer: Self-pay

## 2018-10-20 ENCOUNTER — Ambulatory Visit (INDEPENDENT_AMBULATORY_CARE_PROVIDER_SITE_OTHER): Payer: Medicare Other | Admitting: Pharmacist

## 2018-10-20 DIAGNOSIS — I480 Paroxysmal atrial fibrillation: Secondary | ICD-10-CM

## 2018-10-20 DIAGNOSIS — Z5181 Encounter for therapeutic drug level monitoring: Secondary | ICD-10-CM

## 2018-10-20 DIAGNOSIS — I482 Chronic atrial fibrillation, unspecified: Secondary | ICD-10-CM | POA: Diagnosis not present

## 2018-10-20 LAB — POCT INR: INR: 2.8 (ref 2.0–3.0)

## 2018-10-20 NOTE — Patient Instructions (Addendum)
Continue taking 1 tablet daily. Recheck INR in 8 weeks.  Call us with any medication changes [phone] to Coumadin clinic.

## 2018-11-15 ENCOUNTER — Ambulatory Visit (INDEPENDENT_AMBULATORY_CARE_PROVIDER_SITE_OTHER): Payer: Medicare Other | Admitting: *Deleted

## 2018-11-15 ENCOUNTER — Other Ambulatory Visit: Payer: Self-pay

## 2018-11-15 DIAGNOSIS — Z23 Encounter for immunization: Secondary | ICD-10-CM

## 2018-11-15 NOTE — Progress Notes (Signed)
Patient here today for flu shot.    Vaccine given and patient tolerated well.

## 2018-11-28 LAB — HM DIABETES EYE EXAM

## 2018-12-04 ENCOUNTER — Other Ambulatory Visit: Payer: Self-pay | Admitting: Internal Medicine

## 2018-12-15 ENCOUNTER — Ambulatory Visit (INDEPENDENT_AMBULATORY_CARE_PROVIDER_SITE_OTHER): Payer: Medicare Other | Admitting: *Deleted

## 2018-12-15 ENCOUNTER — Other Ambulatory Visit: Payer: Self-pay

## 2018-12-15 DIAGNOSIS — Z5181 Encounter for therapeutic drug level monitoring: Secondary | ICD-10-CM | POA: Diagnosis not present

## 2018-12-15 DIAGNOSIS — I482 Chronic atrial fibrillation, unspecified: Secondary | ICD-10-CM

## 2018-12-15 DIAGNOSIS — I480 Paroxysmal atrial fibrillation: Secondary | ICD-10-CM

## 2018-12-15 LAB — POCT INR: INR: 2.2 (ref 2.0–3.0)

## 2018-12-15 NOTE — Patient Instructions (Signed)
Description   Continue taking 1 tablet daily. Recheck INR in 8 weeks.  Call us with any medication changes [phone] to Coumadin clinic.

## 2018-12-23 ENCOUNTER — Other Ambulatory Visit: Payer: Self-pay | Admitting: Endocrinology

## 2018-12-23 NOTE — Telephone Encounter (Signed)
Per Dr. Ellison, unable to refill Novolin 70/30 without an appt. Routing this message to the front desk for scheduling purposes.  

## 2018-12-23 NOTE — Telephone Encounter (Signed)
Please advise 

## 2018-12-23 NOTE — Telephone Encounter (Signed)
1.  Please schedule f/u appt 2.  Then please refill x 1, pending that appt.  

## 2018-12-23 NOTE — Telephone Encounter (Signed)
LMTCB TO SCHEDULE

## 2018-12-26 ENCOUNTER — Other Ambulatory Visit: Payer: Self-pay

## 2018-12-26 ENCOUNTER — Encounter: Payer: Self-pay | Admitting: Internal Medicine

## 2018-12-27 NOTE — Telephone Encounter (Signed)
Patient is scheduled for appointment on 12/28/18 at 10:00 a.m.

## 2018-12-27 NOTE — Telephone Encounter (Signed)
Noted.  Patient will be seen tomorrow and RX done at that time.

## 2018-12-28 ENCOUNTER — Encounter: Payer: Self-pay | Admitting: Endocrinology

## 2018-12-28 ENCOUNTER — Other Ambulatory Visit: Payer: Self-pay

## 2018-12-28 ENCOUNTER — Ambulatory Visit (INDEPENDENT_AMBULATORY_CARE_PROVIDER_SITE_OTHER): Payer: Medicare Other | Admitting: Endocrinology

## 2018-12-28 VITALS — BP 136/62 | HR 70 | Ht 69.0 in | Wt 188.8 lb

## 2018-12-28 DIAGNOSIS — Z794 Long term (current) use of insulin: Secondary | ICD-10-CM

## 2018-12-28 DIAGNOSIS — N1832 Chronic kidney disease, stage 3b: Secondary | ICD-10-CM | POA: Diagnosis not present

## 2018-12-28 DIAGNOSIS — E1159 Type 2 diabetes mellitus with other circulatory complications: Secondary | ICD-10-CM | POA: Diagnosis not present

## 2018-12-28 DIAGNOSIS — I2510 Atherosclerotic heart disease of native coronary artery without angina pectoris: Secondary | ICD-10-CM | POA: Diagnosis not present

## 2018-12-28 DIAGNOSIS — Z9861 Coronary angioplasty status: Secondary | ICD-10-CM | POA: Diagnosis not present

## 2018-12-28 DIAGNOSIS — E1121 Type 2 diabetes mellitus with diabetic nephropathy: Secondary | ICD-10-CM

## 2018-12-28 LAB — POCT GLYCOSYLATED HEMOGLOBIN (HGB A1C): Hemoglobin A1C: 7.2 % — AB (ref 4.0–5.6)

## 2018-12-28 MED ORDER — NOVOLIN 70/30 RELION (70-30) 100 UNIT/ML ~~LOC~~ SUSP: SUBCUTANEOUS | 11 refills | Status: DC

## 2018-12-28 NOTE — Progress Notes (Signed)
Subjective:    Patient ID: [name] [name], female    DOB: 01-Feb-1941, 78 y.o.   MRN: [id number]  HPI Pt returns for f/u of diabetes mellitus: DM type: Insulin-requiring type 2 Dx'ed: 0000000 Complications: polyneuropathy, CAD, renal insuff, and DR.   Therapy: insulin since soon after dx.  GDM: never.  DKA: never.  Severe hypoglycemia: last episode was in mid-2018.  Pancreatitis: never.   Other: she is on BID premixed insulin, due to noncompliance with multiple daily injections.  She takes human insulin, due to cost.   Interval history: no cbg record, but states cbg's vary from 72-191.  There is no trend throughout the day.  pt states she feels well in general.   Past Medical History:  Diagnosis Date  . Atrial fibrillation ([place])    SVT dx 2007, cath 2007 mild CAD, then had an cardioversion, ablation; still on coumadin , has occ palpitation, EKG 03-2010 NSR  . Blindness of left eye   . CKD (chronic kidney disease), stage III 06/16/2016   [name] 06/17/2016  . DIABETES MELLITUS, TYPE II 03/27/2006   dr [name]  . Eye muscle weakness    Right eye weakness after cataract surgery  . GERD (gastroesophageal reflux disease) 10/05/2011  . 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 HYPERLIPIDEMIA 03/27/2006  . HYPERTENSION 03/27/2006  . Intertrochanteric fracture of right hip ([place]) 07/13/2012  . 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 Osteopenia 2004   Dexa 2004 showed Osteopenia, DEXA 03/2007 normal  . Osteopenia   . Other chronic cystitis with hematuria   . Pelvic fracture ([place]) 06/16/2016   S/P fall  . Recurrent urinary tract infection    Seeing Urology  . 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 Seizures ([place])   . 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[place]) [id number]    Past Surgical History:  Procedure Laterality Date  . ANKLE FRACTURE SURGERY Bilateral 08/2015   "right one was in 2 places"  . CARDIAC CATHETERIZATION N/A 05/17/2015   Procedure: Left Heart  Cath and Coronary Angiography;  Surgeon: Jettie Booze, MD;  Location: [facility];  Service: Cardiovascular;  Laterality: N/A;  . CARDIAC CATHETERIZATION N/A 05/17/2015   Procedure: Coronary Balloon Angioplasty;  Surgeon: Jettie Booze, MD;  Location: [facility];  Service: Cardiovascular;  Laterality: N/A;  . FEMUR IM NAIL Right 07/15/2012   Procedure: INTRAMEDULLARY (IM) NAIL HIP;  Surgeon: Johnny Bridge, MD;  Location: [facility];  Service: Orthopedics;  Laterality: Right;  . FEMUR IM NAIL Left 12/02/2015   Procedure: INTRAMEDULLARY (IM) NAIL FEMORAL;  Surgeon: [name], MD;  Location: [facility];  Service: Orthopedics;  Laterality: Left;  . FRACTURE SURGERY    . INCISION AND DRAINAGE HIP Left 01/03/2016   Procedure: IRRIGATION AND DEBRIDEMENT HIP;  Surgeon: [name], MD;  Location: [facility];  Service: Orthopedics;  Laterality: Left;  . SHOULDER SURGERY Bilateral    "don't remember which side"    Social History   Socioeconomic History  . Marital status: Married    Spouse name: Jenny Reichmann  . Number of children: 1  . Years of education: College  . Highest education level: Not on file  Occupational History  . Occupation: retired     Fish farm manager: RETIRED  Social Needs  . Financial resource strain: Not on file  . Food insecurity    Worry: Not on file    Inability: Not on  file  . Transportation needs    Medical: Not on file    Non-medical: Not on file  Tobacco Use  . Smoking status: Former Smoker    Types: Cigarettes    Quit date: 03/02/1978    Years since quitting: 40.8  . Smokeless tobacco: Never Used  Substance and Sexual Activity  . Alcohol use: No    Alcohol/week: 0.0 standard drinks  . Drug use: No  . Sexual activity: Not Currently  Lifestyle  . Physical activity    Days per week: Not on file    Minutes per session: Not on file  . Stress: Not on file  Relationships  . Social Herbalist on phone: Not on file    Gets together: Not on file     Attends religious service: Not on file    Active member of club or organization: Not on file    Attends meetings of clubs or organizations: Not on file    Relationship status: Not on file  . Intimate partner violence    Fear of current or ex partner: Not on file    Emotionally abused: Not on file    Physically abused: Not on file    Forced sexual activity: Not on file  Other Topics Concern  . Not on file  Social History Narrative   Patient lives at home spouse. Uses a walker consistently.   Moved from [place] 2004   1 son, problems w/ drugs, passed away March 06, 2016-- OD        Current Outpatient Medications on File Prior to Visit  Medication Sig Dispense Refill  . alendronate (FOSAMAX) 70 MG tablet Take 1 tablet (70 mg total) by mouth every 7 (seven) days. Take with a full glass of water on an empty stomach. 4 tablet 11  . aspirin EC 81 MG tablet Take 1 tablet (81 mg total) by mouth daily. 30 tablet 3  . atorvastatin (LIPITOR) 80 MG tablet Take 1 tablet (80 mg total) by mouth daily. 90 tablet 1  . calcium-vitamin D (OSCAL WITH D) 500-200 MG-UNIT per tablet Take 1 tablet by mouth daily.    [name] escitalopram (LEXAPRO) 10 MG tablet Take 1 tablet (10 mg total) by mouth daily.    . furosemide (LASIX) 40 MG tablet Take 1 tablet (40 mg total) by mouth 2 (two) times daily as needed for fluid. 180 tablet 1  . metoprolol succinate (TOPROL-XL) 25 MG 24 hr tablet Take 1 tablet (25 mg total) by mouth daily. Take with or immediately following a meal 90 tablet 1  . Multiple Vitamins-Minerals (MULTIVITAMIN ADULT PO) Take 1 tablet by mouth daily with breakfast.     . pantoprazole (PROTONIX) 40 MG tablet Take 1 tablet (40 mg total) by mouth daily. 90 tablet 3  . warfarin (COUMADIN) 2.5 MG tablet TAKE AS DIRECTED PER  COUMADIN  CLINIC 100 tablet 0   No current facility-administered medications on file prior to visit.     Allergies  Allergen Reactions  . Procaine Hcl Anaphylaxis  . Amoxicillin Itching     Did  it involve swelling of the face/tongue/throat, SOB, or low BP? No Did it involve sudden or severe rash/hives, skin peeling, or any reaction on the inside of your mouth or nose? No Did you need to seek medical attention at a hospital or doctor's office? No When did it last happen?unk If all above answers are "NO", may proceed with cephalosporin use.     Family History  Problem  Relation Age of Onset  . Diabetes Father   . Heart attack Father 71  . Diabetes Sister   . Drug abuse Son   . Cancer Neg Hx        no hx of colon or breast cancer    BP 136/62 (BP Location: Right Arm, Patient Position: Sitting, Cuff Size: Large)   Pulse 70   Ht 5\' 9"  (1.753 m)   Wt 188 lb 12.8 oz (85.6 kg)   SpO2 94%   BMI 27.88 kg/m    Review of Systems She denies hypoglycemia.      Objective:   Physical Exam VITAL SIGNS:  See vs page GENERAL: no distress Pulses: dorsalis pedis intact bilat.   MSK: no deformity of the feet CV: no leg edema Skin:  no ulcer on the feet.  normal color and temp on the feet.   Neuro: sensation is intact to touch on the feet.    Lab Results  Component Value Date   HGBA1C 7.2 (A) 12/28/2018    Lab Results  Component Value Date   CREATININE 1.63 (H) 08/01/2018   BUN 26 (H) 08/01/2018   NA 138 08/01/2018   K 4.3 08/01/2018   CL 99 08/01/2018   CO2 30 08/01/2018       Assessment & Plan:  Insulin-requiring type 2 DM, with CAD: overcontrolled, given this regimen, which does match insulin to her changing needs throughout the day.   Patient Instructions  Please change the insulin to 30 units with breakfast, and 3 units with supper.  On this type of insulin schedule, you should eat meals on a regular schedule.  If a meal is missed or significantly delayed, your blood sugar could go low.  Please come back for a follow-up appointment in 4 months.   check your blood sugar twice a day.  vary the time of day when you check, between before the 3 meals, and at bedtime.   also check if you have symptoms of your blood sugar being too high or too low.  please keep a record of the readings and bring it to your next appointment here.  please call us sooner if your blood sugar goes below 70, or if you have a lot of readings over 200.

## 2018-12-28 NOTE — Patient Instructions (Signed)
Please change the insulin to 30 units with breakfast, and 3 units with supper.  On this type of insulin schedule, you should eat meals on a regular schedule.  If a meal is missed or significantly delayed, your blood sugar could go low.  Please come back for a follow-up appointment in 4 months.   check your blood sugar twice a day.  vary the time of day when you check, between before the 3 meals, and at bedtime.  also check if you have symptoms of your blood sugar being too high or too low.  please keep a record of the readings and bring it to your next appointment here.  please call us sooner if your blood sugar goes below 70, or if you have a lot of readings over 200.

## 2019-01-04 ENCOUNTER — Ambulatory Visit (INDEPENDENT_AMBULATORY_CARE_PROVIDER_SITE_OTHER): Payer: Medicare Other | Admitting: Internal Medicine

## 2019-01-04 ENCOUNTER — Encounter: Payer: Self-pay | Admitting: Internal Medicine

## 2019-01-04 ENCOUNTER — Other Ambulatory Visit: Payer: Self-pay

## 2019-01-04 VITALS — BP 107/64 | HR 84 | Temp 97.1°F | Resp 18 | Ht 69.0 in | Wt 186.2 lb

## 2019-01-04 DIAGNOSIS — I2510 Atherosclerotic heart disease of native coronary artery without angina pectoris: Secondary | ICD-10-CM

## 2019-01-04 DIAGNOSIS — E11319 Type 2 diabetes mellitus with unspecified diabetic retinopathy without macular edema: Secondary | ICD-10-CM

## 2019-01-04 DIAGNOSIS — E785 Hyperlipidemia, unspecified: Secondary | ICD-10-CM | POA: Diagnosis not present

## 2019-01-04 DIAGNOSIS — Z9861 Coronary angioplasty status: Secondary | ICD-10-CM | POA: Diagnosis not present

## 2019-01-04 DIAGNOSIS — I10 Essential (primary) hypertension: Secondary | ICD-10-CM | POA: Diagnosis not present

## 2019-01-04 DIAGNOSIS — Z794 Long term (current) use of insulin: Secondary | ICD-10-CM

## 2019-01-04 DIAGNOSIS — M810 Age-related osteoporosis without current pathological fracture: Secondary | ICD-10-CM

## 2019-01-04 LAB — LIPID PANEL
Cholesterol: 131 mg/dL (ref 0–200)
HDL: 44.3 mg/dL (ref >39.00)
LDL Cholesterol: 63 mg/dL (ref 0–99)
NonHDL: 86.26
Total CHOL/HDL Ratio: 3
Triglycerides: 116 mg/dL (ref 0.0–149.0)
VLDL: 23.2 mg/dL (ref 0.0–40.0)

## 2019-01-04 LAB — BASIC METABOLIC PANEL
BUN: 26 mg/dL — ABNORMAL HIGH (ref 6–23)
CO2: 31 mEq/L (ref 19–32)
Calcium: 9.6 mg/dL (ref 8.4–10.5)
Chloride: 101 mEq/L (ref 96–112)
Creatinine, Ser: 1.64 mg/dL — ABNORMAL HIGH (ref 0.40–1.20)
GFR: 30.37 mL/min — ABNORMAL LOW (ref >60.00)
Glucose, Bld: 74 mg/dL (ref 70–99)
Potassium: 4.2 mEq/L (ref 3.5–5.1)
Sodium: 142 mEq/L (ref 135–145)

## 2019-01-04 NOTE — Progress Notes (Signed)
Pre visit review using our clinic review tool, if applicable. No additional management support is needed unless otherwise documented below in the visit note. 

## 2019-01-04 NOTE — Assessment & Plan Note (Signed)
Declined shingles Again declines further breast, cervical or colon cancer screening.

## 2019-01-04 NOTE — Patient Instructions (Addendum)
Please schedule Medicare Wellness with Glenard Haring.   GO TO THE LAB : Get the blood work     GO TO THE FRONT DESK Schedule your next appointment   for checkup in 6 to 8 months  We are referring you for a bone density test at the [facility] location.  If you are not contacted within the next 2 weeks please call my office

## 2019-01-04 NOTE — Progress Notes (Signed)
Subjective:    Patient ID: [name] [name], female    DOB: 1940/08/07, 78 y.o.   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 Atrial fibrillation ([place])    SVT dx 2007, cath 2007 mild CAD, then had an cardioversion, ablation; still on coumadin , has occ palpitation, EKG 03-2010 NSR  . Blindness of left eye   . CKD (chronic kidney disease), stage III 06/16/2016   [name] 06/17/2016  . DIABETES MELLITUS, TYPE II 03/27/2006   dr [name]  . Eye muscle weakness    Right eye weakness after cataract surgery  . GERD (gastroesophageal reflux disease) 10/05/2011  . 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 HYPERLIPIDEMIA 03/27/2006  . HYPERTENSION 03/27/2006  . Intertrochanteric fracture of right hip ([place]) 07/13/2012  . 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 Osteopenia 2004   Dexa 2004 showed Osteopenia, DEXA 03/2007 normal  . Osteopenia   . Other chronic cystitis with hematuria   . Pelvic fracture ([place]) 06/16/2016   S/P fall  . Recurrent urinary tract infection    Seeing Urology  . 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 Seizures ([place])   . 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[place]) [id number]    Past Surgical History:  Procedure Laterality Date  . ANKLE FRACTURE SURGERY Bilateral 08/2015   "right one was in 2 places"  . CARDIAC CATHETERIZATION N/A 05/17/2015   Procedure: Left Heart Cath and Coronary Angiography;  Surgeon: Jettie Booze, MD;  Location: [facility];  Service: Cardiovascular;  Laterality: N/A;  . CARDIAC CATHETERIZATION N/A 05/17/2015   Procedure: Coronary Balloon Angioplasty;  Surgeon: Jettie Booze, MD;  Location: [facility];  Service: Cardiovascular;  Laterality: N/A;  . FEMUR IM NAIL Right 07/15/2012   Procedure: INTRAMEDULLARY (IM) NAIL HIP;  Surgeon: Johnny Bridge, MD;  Location: [facility];  Service: Orthopedics;  Laterality: Right;  . FEMUR IM NAIL Left 12/02/2015   Procedure: INTRAMEDULLARY (IM) NAIL FEMORAL;  Surgeon: [name], MD;  Location: [facility];  Service: Orthopedics;  Laterality: Left;  . FRACTURE SURGERY    . INCISION AND DRAINAGE HIP Left 01/03/2016   Procedure: IRRIGATION AND DEBRIDEMENT HIP;  Surgeon: [name], MD;  Location: [facility];  Service: Orthopedics;  Laterality: Left;  . SHOULDER SURGERY Bilateral    "don't remember which side"    Social History   Socioeconomic History  . Marital status: Married    Spouse name: Jenny Reichmann  . Number of children: 1  . Years of education: College  . Highest education level: Not on file  Occupational History  . Occupation: retired     Fish farm manager: RETIRED  Social Needs  . Financial resource strain: Not on file  . Food insecurity    Worry: Not on file    Inability: Not on file  . Transportation needs    Medical: Not on file    Non-medical: Not on file  Tobacco Use  . Smoking status: Former Smoker    Types: Cigarettes    Quit date: 03/02/1978    Years since quitting: 40.8  . Smokeless tobacco: Never Used  Substance and Sexual  Activity  . Alcohol use: No    Alcohol/week: 0.0 standard drinks  . Drug use: No  . Sexual activity: Not Currently  Lifestyle  . Physical activity    Days per week: Not on file    Minutes per session: Not on file  . Stress: Not on file  Relationships  . Social Herbalist on phone: Not on file    Gets together: Not on file    Attends religious service: Not on file    Active member of club or organization: Not on file    Attends meetings of clubs or organizations: Not on file    Relationship status: Not on file  . Intimate partner violence    Fear of current or ex partner: Not on file     Emotionally abused: Not on file    Physically abused: Not on file    Forced sexual activity: Not on file  Other Topics Concern  . Not on file  Social History Narrative   Patient lives at home spouse. Uses a walker consistently.   Moved from [place] 2004   1 son, problems w/ drugs, passed away 03/01/2016-- OD          Allergies as of 01/04/2019      Reactions   Procaine Hcl Anaphylaxis   Amoxicillin Itching   Did it involve swelling of the face/tongue/throat, SOB, or low BP? No Did it involve sudden or severe rash/hives, skin peeling, or any reaction on the inside of your mouth or nose? No Did you need to seek medical attention at a hospital or doctor's office? No When did it last happen?unk If all above answers are "NO", may proceed with cephalosporin use.      Medication List       Accurate as of January 04, 2019 11:59 PM. If you have any questions, ask your nurse or doctor.        alendronate 70 MG tablet Commonly known as: FOSAMAX Take 1 tablet (70 mg total) by mouth every 7 (seven) days. Take with a full glass of water on an empty stomach.   aspirin EC 81 MG tablet Take 1 tablet (81 mg total) by mouth daily.   atorvastatin 80 MG tablet Commonly known as: LIPITOR Take 1 tablet (80 mg total) by mouth daily.   calcium-vitamin D 500-200 MG-UNIT tablet Commonly known as: OSCAL WITH D Take 1 tablet by mouth daily.   escitalopram 10 MG tablet Commonly known as: LEXAPRO Take 1 tablet (10 mg total) by mouth daily.   furosemide 40 MG tablet Commonly known as: LASIX Take 1 tablet (40 mg total) by mouth 2 (two) times daily as needed for fluid.   metoprolol succinate 25 MG 24 hr tablet Commonly known as: TOPROL-XL Take 1 tablet (25 mg total) by mouth daily. Take with or immediately following a meal   MULTIVITAMIN ADULT PO Take 1 tablet by mouth daily with breakfast.   NovoLIN 70/30 ReliOn (70-30) 100 UNIT/ML injection Generic drug: insulin NPH-regular Human 30 units with  breakfast and 3 units with evening meal.   pantoprazole 40 MG tablet Commonly known as: PROTONIX Take 1 tablet (40 mg total) by mouth daily.   warfarin 2.5 MG tablet Commonly known as: COUMADIN Take as directed by the anticoagulation clinic. If you are unsure how to take this medication, talk to your nurse or doctor. Original instructions: TAKE AS DIRECTED PER  COUMADIN  CLINIC  Objective:   Physical Exam BP 107/64 (BP Location: Right Arm, Patient Position: Sitting, Cuff Size: Normal)   Pulse 84   Temp (!) 97.1 F (36.2 C) (Temporal)   Resp 18   Ht 5\' 9"  (1.753 m)   Wt 186 lb 4 oz (84.5 kg)   SpO2 100%   BMI 27.50 kg/m  General:   Well developed, NAD, BMI noted. HEENT:  Normocephalic . Face symmetric, atraumatic DM foot exam: No edema, pedal pulses present, slightly decreased?  Pinprick normal. Neurologic:  alert & oriented X3.  Speech normal, motor symmetric  Psych--  Cognition and judgment appear intact.  Cooperative with normal attention span and concentration.  Behavior appropriate. No anxious or depressed appearing.      Assessment     Assessment   DM  + retinopathy, Dr [name] HTN CKD: Creatinine 1.6 = GFR ~ 26 Hyperlipidemia anxiety ativan GERD Elevated creatinine(started after admission 05-2015, had IV contrast) Anemia, normal iron 05-2015, never had a colonoscopy CV ---NSTEMI 05-2015, cath, angiplasty ---Echo, EF 40 % (05-2015) ---Atrial fibrillation, SVT -- dx 2007, cath 2007 mild CAD. 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  Complicated by retinopathy, CKD.  No neuropathy, foot exam normal today. HTN: Controlled, continue Lasix, metoprolol.  Check a BMP High cholesterol: On Lipitor, check FLP Osteoporosis: On Fosamax, check a bone density test at our [facility] location.  No history of vitamin D deficiency Preventive care reviewed RTC 6 to 8 months

## 2019-01-05 NOTE — Assessment & Plan Note (Signed)
DM: Per Endo, last A1c 7.2.  Complicated by retinopathy, CKD.  No neuropathy, foot exam normal today. HTN: Controlled, continue Lasix, metoprolol.  Check a BMP High cholesterol: On Lipitor, check FLP Osteoporosis: On Fosamax, check a bone density test at our [facility] location.  No history of vitamin D deficiency Preventive care reviewed RTC 6 to 8 months

## 2019-01-06 ENCOUNTER — Other Ambulatory Visit: Payer: Self-pay | Admitting: Internal Medicine

## 2019-01-07 ENCOUNTER — Other Ambulatory Visit: Payer: Self-pay | Admitting: Endocrinology

## 2019-01-25 ENCOUNTER — Other Ambulatory Visit: Payer: Self-pay

## 2019-02-13 ENCOUNTER — Ambulatory Visit (INDEPENDENT_AMBULATORY_CARE_PROVIDER_SITE_OTHER): Payer: Medicare Other | Admitting: *Deleted

## 2019-02-13 ENCOUNTER — Other Ambulatory Visit: Payer: Self-pay

## 2019-02-13 DIAGNOSIS — I482 Chronic atrial fibrillation, unspecified: Secondary | ICD-10-CM | POA: Diagnosis not present

## 2019-02-13 DIAGNOSIS — I480 Paroxysmal atrial fibrillation: Secondary | ICD-10-CM

## 2019-02-13 DIAGNOSIS — Z5181 Encounter for therapeutic drug level monitoring: Secondary | ICD-10-CM

## 2019-02-13 LAB — POCT INR: INR: 2 (ref 2.0–3.0)

## 2019-02-13 NOTE — Patient Instructions (Signed)
Description   Continue taking 1 tablet daily. Recheck INR in 8 weeks.  Call us with any medication changes [phone] to Coumadin clinic.

## 2019-02-23 ENCOUNTER — Other Ambulatory Visit: Payer: Self-pay | Admitting: Endocrinology

## 2019-04-09 ENCOUNTER — Other Ambulatory Visit: Payer: Self-pay | Admitting: Internal Medicine

## 2019-04-10 ENCOUNTER — Ambulatory Visit (INDEPENDENT_AMBULATORY_CARE_PROVIDER_SITE_OTHER): Payer: Medicare Other | Admitting: *Deleted

## 2019-04-10 ENCOUNTER — Other Ambulatory Visit: Payer: Self-pay

## 2019-04-10 DIAGNOSIS — Z5181 Encounter for therapeutic drug level monitoring: Secondary | ICD-10-CM | POA: Diagnosis not present

## 2019-04-10 DIAGNOSIS — I482 Chronic atrial fibrillation, unspecified: Secondary | ICD-10-CM | POA: Diagnosis not present

## 2019-04-10 DIAGNOSIS — I480 Paroxysmal atrial fibrillation: Secondary | ICD-10-CM

## 2019-04-10 LAB — POCT INR: INR: 1.8 — AB (ref 2.0–3.0)

## 2019-04-10 NOTE — Patient Instructions (Signed)
Description   Today take 1.5 tablets then continue taking 1 tablet daily. Recheck INR in 5 weeks.  Call us with any medication changes [phone] to Coumadin clinic.

## 2019-05-01 ENCOUNTER — Other Ambulatory Visit: Payer: Self-pay

## 2019-05-03 ENCOUNTER — Encounter: Payer: Self-pay | Admitting: Endocrinology

## 2019-05-03 ENCOUNTER — Ambulatory Visit (INDEPENDENT_AMBULATORY_CARE_PROVIDER_SITE_OTHER): Payer: Medicare Other | Admitting: Endocrinology

## 2019-05-03 ENCOUNTER — Other Ambulatory Visit: Payer: Self-pay

## 2019-05-03 VITALS — BP 102/60 | HR 68 | Ht 69.0 in | Wt 180.8 lb

## 2019-05-03 DIAGNOSIS — E1159 Type 2 diabetes mellitus with other circulatory complications: Secondary | ICD-10-CM

## 2019-05-03 DIAGNOSIS — E1121 Type 2 diabetes mellitus with diabetic nephropathy: Secondary | ICD-10-CM

## 2019-05-03 DIAGNOSIS — N1832 Chronic kidney disease, stage 3b: Secondary | ICD-10-CM | POA: Diagnosis not present

## 2019-05-03 DIAGNOSIS — Z794 Long term (current) use of insulin: Secondary | ICD-10-CM | POA: Diagnosis not present

## 2019-05-03 DIAGNOSIS — E1122 Type 2 diabetes mellitus with diabetic chronic kidney disease: Secondary | ICD-10-CM

## 2019-05-03 LAB — POCT GLYCOSYLATED HEMOGLOBIN (HGB A1C): Hemoglobin A1C: 7.2 % — AB (ref 4.0–5.6)

## 2019-05-03 MED ORDER — NOVOLIN 70/30 RELION (70-30) 100 UNIT/ML ~~LOC~~ SUSP: SUBCUTANEOUS | 0 refills | Status: DC

## 2019-05-03 NOTE — Patient Instructions (Addendum)
Please continue the same insulin.  On this type of insulin schedule, you should eat meals on a regular schedule.  If a meal is missed or significantly delayed, your blood sugar could go low.  Please come back for a follow-up appointment in 4 months.   check your blood sugar twice a day.  vary the time of day when you check, between before the 3 meals, and at bedtime.  also check if you have symptoms of your blood sugar being too high or too low.  please keep a record of the readings and bring it to your next appointment here.  please call us sooner if your blood sugar goes below 70, or if you have a lot of readings over 200.    

## 2019-05-03 NOTE — Progress Notes (Signed)
Subjective:    Patient ID: [name] [name], female    DOB: 07/08/1940, 79 y.o.   MRN: [id number]  HPI Pt returns for f/u of diabetes mellitus: DM type: Insulin-requiring type 2 Dx'ed: 0000000 Complications: polyneuropathy, CAD, renal insuff, and DR.   Therapy: insulin since soon after dx.  GDM: never.  DKA: never.  Severe hypoglycemia: last episode was in mid-2018.  Pancreatitis: never.  SDOH: She takes human insulin, due to cost.  Other: she is on BID premixed insulin, due to noncompliance with multiple daily injections.    Interval history: no cbg record, but states cbg's vary from 97-143.  There is no trend throughout the day.  pt states she feels well in general.  She takes 24 units qam and 4 units qpm Past Medical History:  Diagnosis Date  . Atrial fibrillation ([place])    SVT dx 2007, cath 2007 mild CAD, then had an cardioversion, ablation; still on coumadin , has occ palpitation, EKG 03-2010 NSR  . Blindness of left eye   . CKD (chronic kidney disease), stage III 06/16/2016   [name] 06/17/2016  . DIABETES MELLITUS, TYPE II 03/27/2006   dr [name]  . Eye muscle weakness    Right eye weakness after cataract surgery  . GERD (gastroesophageal reflux disease) 10/05/2011  . 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 HYPERLIPIDEMIA 03/27/2006  . HYPERTENSION 03/27/2006  . Intertrochanteric fracture of right hip ([place]) 07/13/2012  . 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 Osteopenia 2004   Dexa 2004 showed Osteopenia, DEXA 03/2007 normal  . Osteopenia   . Other chronic cystitis with hematuria   . Pelvic fracture ([place]) 06/16/2016   S/P fall  . Recurrent urinary tract infection    Seeing Urology  . 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 Seizures ([place])   . 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[place]) [id number]    Past Surgical History:  Procedure Laterality Date  . ANKLE FRACTURE SURGERY Bilateral 08/2015   "right one was in 2 places"  . CARDIAC  CATHETERIZATION N/A 05/17/2015   Procedure: Left Heart Cath and Coronary Angiography;  Surgeon: Jettie Booze, MD;  Location: [facility];  Service: Cardiovascular;  Laterality: N/A;  . CARDIAC CATHETERIZATION N/A 05/17/2015   Procedure: Coronary Balloon Angioplasty;  Surgeon: Jettie Booze, MD;  Location: [facility];  Service: Cardiovascular;  Laterality: N/A;  . FEMUR IM NAIL Right 07/15/2012   Procedure: INTRAMEDULLARY (IM) NAIL HIP;  Surgeon: Johnny Bridge, MD;  Location: [facility];  Service: Orthopedics;  Laterality: Right;  . FEMUR IM NAIL Left 12/02/2015   Procedure: INTRAMEDULLARY (IM) NAIL FEMORAL;  Surgeon: [name], MD;  Location: [place];  Service: Orthopedics;  Laterality: Left;  . FRACTURE SURGERY    . INCISION AND DRAINAGE HIP Left 01/03/2016   Procedure: IRRIGATION AND DEBRIDEMENT HIP;  Surgeon: [name], MD;  Location: [facility];  Service: Orthopedics;  Laterality: Left;  . SHOULDER SURGERY Bilateral    "don't remember which side"    Social History   Socioeconomic History  . Marital status: Married    Spouse name: Jenny Reichmann  . Number of children: 1  . Years of education: College  . Highest education level: Not on file  Occupational History  . Occupation: retired     Fish farm manager: RETIRED  Tobacco Use  . Smoking status: Former Smoker    Types: Cigarettes    Quit date:  03/02/1978    Years since quitting: 41.2  . Smokeless tobacco: Never Used  Substance and Sexual Activity  . Alcohol use: No    Alcohol/week: 0.0 standard drinks  . Drug use: No  . Sexual activity: Not Currently  Other Topics Concern  . Not on file  Social History Narrative   Patient lives at home spouse. Uses a walker consistently.   Moved from [place] 2004   1 son, problems w/ drugs, passed away 2016/03/23-- OD       Social Determinants of Health   Financial Resource Strain:   . Difficulty of Paying Living Expenses: Not on file  Food Insecurity:   . Worried About Charity fundraiser  in the Last Year: Not on file  . Ran Out of Food in the Last Year: Not on file  Transportation Needs:   . Lack of Transportation (Medical): Not on file  . Lack of Transportation (Non-Medical): Not on file  Physical Activity:   . Days of Exercise per Week: Not on file  . Minutes of Exercise per Session: Not on file  Stress:   . Feeling of Stress : Not on file  Social Connections:   . Frequency of Communication with Friends and Family: Not on file  . Frequency of Social Gatherings with Friends and Family: Not on file  . Attends Religious Services: Not on file  . Active Member of Clubs or Organizations: Not on file  . Attends Archivist Meetings: Not on file  . Marital Status: Not on file  Intimate Partner Violence:   . Fear of Current or Ex-Partner: Not on file  . Emotionally Abused: Not on file  . Physically Abused: Not on file  . Sexually Abused: Not on file    Current Outpatient Medications on File Prior to Visit  Medication Sig Dispense Refill  . alendronate (FOSAMAX) 70 MG tablet Take 1 tablet (70 mg total) by mouth every 7 (seven) days. Take with a full glass of water on an empty stomach. 4 tablet 11  . aspirin EC 81 MG tablet Take 1 tablet (81 mg total) by mouth daily. 30 tablet 3  . atorvastatin (LIPITOR) 80 MG tablet Take 1 tablet (80 mg total) by mouth daily. 90 tablet 1  . calcium-vitamin D (OSCAL WITH D) 500-200 MG-UNIT per tablet Take 1 tablet by mouth daily.    [name] escitalopram (LEXAPRO) 10 MG tablet Take 1 tablet (10 mg total) by mouth daily.    . furosemide (LASIX) 40 MG tablet Take 1 tablet (40 mg total) by mouth 2 (two) times daily as needed for fluid. 180 tablet 1  . metoprolol succinate (TOPROL-XL) 25 MG 24 hr tablet Take 1 tablet (25 mg total) by mouth daily. Take with or immediately following a meal. 90 tablet 2  . Multiple Vitamins-Minerals (MULTIVITAMIN ADULT PO) Take 1 tablet by mouth daily with breakfast.     . pantoprazole (PROTONIX) 40 MG tablet  Take 1 tablet (40 mg total) by mouth daily. 90 tablet 3  . warfarin (COUMADIN) 2.5 MG tablet TAKE AS DIRECTED PER COUMADIN CLINIC 100 tablet 0   No current facility-administered medications on file prior to visit.    Allergies  Allergen Reactions  . Procaine Hcl Anaphylaxis  . Amoxicillin Itching     Did it involve swelling of the face/tongue/throat, SOB, or low BP? No Did it involve sudden or severe rash/hives, skin peeling, or any reaction on the inside of your mouth or nose? No Did you  need to seek medical attention at a hospital or doctor's office? No When did it last happen?unk If all above answers are "NO", may proceed with cephalosporin use.     Family History  Problem Relation Age of Onset  . Diabetes Father   . Heart attack Father 32  . Diabetes Sister   . Drug abuse Son   . Cancer Neg Hx        no hx of colon or breast cancer    BP 102/60 (BP Location: Right Arm, Patient Position: Sitting, Cuff Size: Large)   Pulse 68   Ht 5\' 9"  (1.753 m)   Wt 180 lb 12.8 oz (82 kg)   SpO2 97%   BMI 26.70 kg/m    Review of Systems She denies hypoglycemia.      Objective:   Physical Exam VITAL SIGNS:  See vs page GENERAL: no distress Pulses: dorsalis pedis intact bilat.   MSK: no deformity of the feet CV: no leg edema Skin:  no ulcer on the feet.  normal color and temp on the feet. Neuro: sensation is intact to touch on the feet  Lab Results  Component Value Date   CREATININE 1.64 (H) 01/04/2019   BUN 26 (H) 01/04/2019   NA 142 01/04/2019   K 4.2 01/04/2019   CL 101 01/04/2019   CO2 31 01/04/2019       Assessment & Plan:  Insulin-requiring type 2 DM: Renal insuff: in this setting, she needs very little PM insulin SDOH: This limits rx options.    Patient Instructions  Please continue the same insulin.  On this type of insulin schedule, you should eat meals on a regular schedule.  If a meal is missed or significantly delayed, your blood sugar could go low.   Please come back for a follow-up appointment in 4 months.   check your blood sugar twice a day.  vary the time of day when you check, between before the 3 meals, and at bedtime.  also check if you have symptoms of your blood sugar being too high or too low.  please keep a record of the readings and bring it to your next appointment here.  please call us sooner if your blood sugar goes below 70, or if you have a lot of readings over 200.

## 2019-05-15 ENCOUNTER — Other Ambulatory Visit: Payer: Self-pay

## 2019-05-15 ENCOUNTER — Other Ambulatory Visit: Payer: Self-pay | Admitting: Endocrinology

## 2019-05-15 ENCOUNTER — Ambulatory Visit (INDEPENDENT_AMBULATORY_CARE_PROVIDER_SITE_OTHER): Payer: Medicare Other

## 2019-05-15 DIAGNOSIS — I482 Chronic atrial fibrillation, unspecified: Secondary | ICD-10-CM

## 2019-05-15 DIAGNOSIS — Z5181 Encounter for therapeutic drug level monitoring: Secondary | ICD-10-CM

## 2019-05-15 DIAGNOSIS — I480 Paroxysmal atrial fibrillation: Secondary | ICD-10-CM | POA: Diagnosis not present

## 2019-05-15 LAB — POCT INR: INR: 2.7 (ref 2.0–3.0)

## 2019-05-15 NOTE — Patient Instructions (Signed)
Description   Continue taking 1 tablet daily. Recheck INR in 6 weeks.  Call us with any medication changes [phone] to Coumadin clinic.

## 2019-06-08 ENCOUNTER — Other Ambulatory Visit: Payer: Self-pay

## 2019-06-09 ENCOUNTER — Ambulatory Visit (INDEPENDENT_AMBULATORY_CARE_PROVIDER_SITE_OTHER): Payer: Medicare Other | Admitting: Internal Medicine

## 2019-06-09 ENCOUNTER — Encounter: Payer: Self-pay | Admitting: Internal Medicine

## 2019-06-09 ENCOUNTER — Other Ambulatory Visit: Payer: Self-pay

## 2019-06-09 VITALS — BP 124/45 | HR 63 | Temp 97.3°F | Resp 18 | Ht 69.0 in | Wt 182.0 lb

## 2019-06-09 DIAGNOSIS — L03113 Cellulitis of right upper limb: Secondary | ICD-10-CM

## 2019-06-09 MED ORDER — CEPHALEXIN 500 MG PO CAPS: 500.0000 mg | ORAL_CAPSULE | Freq: Four times a day (QID) | ORAL | 0 refills | Status: DC

## 2019-06-09 MED ORDER — BETAMETHASONE DIPROPIONATE AUG 0.05 % EX CREA: TOPICAL_CREAM | Freq: Two times a day (BID) | CUTANEOUS | 0 refills | Status: DC

## 2019-06-09 NOTE — Progress Notes (Signed)
Pre visit review using our clinic review tool, if applicable. No additional management support is needed unless otherwise documented below in the visit note. 

## 2019-06-09 NOTE — Patient Instructions (Addendum)
Please schedule Medicare Wellness with Glenard Haring.   Take the antibiotic as prescribed, you have taken it before without problems but if you have any allergic reaction stop it immediately and let me know  Apply the cream 2 times a day to help the itching  Call if the redness is spreading or is not getting better in the next 2 to 3 days

## 2019-06-09 NOTE — Progress Notes (Signed)
Subjective:    Patient ID: [name] [name], female    DOB: 06-09-1940, 79 y.o.   MRN: [id number]  DOS:  06/09/2019 Type of visit - description: Acute  Symptoms started 10 to 14 days ago with itching at the right hand along with some redness. She has scratched the  area very hard. She is concerned because now is also somewhat warm.   Review of Systems No recent fever chills No boils no discharge from the right hand  Past Medical History:  Diagnosis Date  . Atrial fibrillation ([place])    SVT dx 2007, cath 2007 mild CAD, then had an cardioversion, ablation; still on coumadin , has occ palpitation, EKG 03-2010 NSR  . Blindness of left eye   . CKD (chronic kidney disease), stage III 06/16/2016   [name] 06/17/2016  . DIABETES MELLITUS, TYPE II 03/27/2006   dr [name]  . Eye muscle weakness    Right eye weakness after cataract surgery  . GERD (gastroesophageal reflux disease) 10/05/2011  . 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 HYPERLIPIDEMIA 03/27/2006  . HYPERTENSION 03/27/2006  . Intertrochanteric fracture of right hip ([place]) 07/13/2012  . 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 Osteopenia 2004   Dexa 2004 showed Osteopenia, DEXA 03/2007 normal  . Osteopenia   . Other chronic cystitis with hematuria   . Pelvic fracture ([place]) 06/16/2016   S/P fall  . Recurrent urinary tract infection    Seeing Urology  . 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 Seizures ([place])   . 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[place]) 06/11/8784    Past Surgical History:  Procedure Laterality Date  . ANKLE FRACTURE SURGERY Bilateral 08/2015   "right one was in 2 places"  . CARDIAC CATHETERIZATION N/A 05/17/2015   Procedure: Left Heart Cath and Coronary Angiography;  Surgeon: Jettie Booze, MD;  Location: [facility];  Service: Cardiovascular;  Laterality: N/A;  . CARDIAC CATHETERIZATION N/A 05/17/2015   Procedure: Coronary Balloon Angioplasty;  Surgeon: Jettie Booze, MD;  Location: [facility];  Service: Cardiovascular;  Laterality: N/A;  . FEMUR IM NAIL Right 07/15/2012   Procedure: INTRAMEDULLARY (IM) NAIL HIP;  Surgeon: Johnny Bridge, MD;  Location: [facility];  Service: Orthopedics;  Laterality: Right;  . FEMUR IM NAIL Left 12/02/2015   Procedure: INTRAMEDULLARY (IM) NAIL FEMORAL;  Surgeon: [name], MD;  Location: [facility];  Service: Orthopedics;  Laterality: Left;  . FRACTURE SURGERY    . INCISION AND DRAINAGE HIP Left 01/03/2016   Procedure: IRRIGATION AND DEBRIDEMENT HIP;  Surgeon: [name], MD;  Location: [facility];  Service: Orthopedics;  Laterality: Left;  . SHOULDER SURGERY Bilateral    "don't remember which side"    Allergies as of 06/09/2019      Reactions   Procaine Hcl Anaphylaxis   Amoxicillin Itching   Did it involve swelling of the face/tongue/throat, SOB, or low BP? No Did it involve sudden or severe rash/hives, skin peeling, or any reaction on the inside of your mouth or nose? No Did you need to seek medical attention at a hospital or doctor's office? No When did it last happen?unk If all above answers are "NO", may proceed with cephalosporin use.      Medication List       Accurate as of June 09, 2019 11:59 PM. If you have any questions, ask your nurse or doctor.  alendronate 70 MG tablet Commonly known as: FOSAMAX Take 1 tablet (70 mg total) by mouth every 7 (seven) days. Take with a full glass of water on an empty stomach.   aspirin EC 81 MG tablet Take 1 tablet (81 mg total) by mouth daily.   atorvastatin 80 MG tablet Commonly known as: LIPITOR Take 1 tablet (80 mg total) by mouth daily.   augmented betamethasone dipropionate 0.05 % cream Commonly known as: DIPROLENE-AF Apply topically 2 (two) times daily. Started by: [name], MD   calcium-vitamin D 500-200 MG-UNIT tablet Commonly known as: OSCAL WITH D Take 1 tablet by mouth daily.   cephALEXin 500 MG capsule Commonly known as:  KEFLEX Take 1 capsule (500 mg total) by mouth 4 (four) times daily for 10 days.   escitalopram 10 MG tablet Commonly known as: LEXAPRO Take 1 tablet (10 mg total) by mouth daily.   furosemide 40 MG tablet Commonly known as: LASIX Take 1 tablet (40 mg total) by mouth 2 (two) times daily as needed for fluid.   metoprolol succinate 25 MG 24 hr tablet Commonly known as: TOPROL-XL Take 1 tablet (25 mg total) by mouth daily. Take with or immediately following a meal.   MULTIVITAMIN ADULT PO Take 1 tablet by mouth daily with breakfast.   NovoLIN 70/30 ReliOn (70-30) 100 UNIT/ML injection Generic drug: insulin NPH-regular Human 24 UNITS WITH BREAKFAST AND 4 UNITS WITH SUPPER   pantoprazole 40 MG tablet Commonly known as: PROTONIX Take 1 tablet (40 mg total) by mouth daily.   warfarin 2.5 MG tablet Commonly known as: COUMADIN Take as directed by the anticoagulation clinic. If you are unsure how to take this medication, talk to your nurse or doctor. Original instructions: TAKE AS DIRECTED PER COUMADIN CLINIC          Objective:   Physical Exam BP (!) 124/45 (BP Location: Left Arm, Patient Position: Sitting, Cuff Size: Normal)   Pulse 63   Temp (!) 97.3 F (36.3 C) (Temporal)   Resp 18   Ht 5\' 9"  (1.753 m)   Wt 182 lb (82.6 kg)   SpO2 100%   BMI 26.88 kg/m  General:   Well developed, NAD, BMI noted. HEENT:  Normocephalic . Face symmetric, atraumatic  Skin: See picture, right hand, dorsum is slightly TTP warm to touch. Neurologic:  alert & oriented X3.  Speech normal, gait appropriate for age and unassisted Psych--  Cognition and judgment appear intact.  Cooperative with normal attention span and concentration.  Behavior appropriate. No anxious or depressed appearing.        Assessment     Assessment   DM  + retinopathy, Dr [name] HTN CKD: Creatinine 1.6 = GFR ~ 26 Hyperlipidemia anxiety ativan GERD Elevated creatinine(started after admission 05-2015, had  IV contrast) Anemia, normal iron 05-2015, never had a colonoscopy CV ---NSTEMI 05-2015, cath, angiplasty ---Echo, EF 40 % (05-2015) ---Atrial fibrillation, SVT -- dx 2007, cath 2007 mild CAD. 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), sz episode 08-2015 (?) - HAs Blindness, left eye Right eye weakness after cataract surgery Osteoporosis: s/p DEXA 2004, DEXA 2009 normal, DEXA 2015 @ [facility] -1.8 but had L1 vertebral fracture 2020 H/o lung bx 2008 benign  05-2015- admitted [place] sepsis  08-2015 admitted  B tibular Fx, no surgery, SNF 12-2015: admitted, hip  FX , surgery 01-2016: admitted, hip surgery infex   05-2016: Admitted, fall, pelvic fracture, d/c to home 10-02-16: admitted  UTI, nonketotic hyperglycemic syndrome  PLAN: Cellulitis, right hand: I wonder if this started as a contact dermatitis with severe itching and now has developed into cellulitis due to skin openings. Plan: Rx Keflex, tolerated cephalosporins well in the past. Also topical steroid. Call if not better, call if worse.  See AVS   This visit occurred during the SARS-CoV-2 public health emergency.  Safety protocols were in place, including screening questions prior to the visit, additional usage of staff PPE, and extensive cleaning of exam room while observing appropriate contact time as indicated for disinfecting solutions.

## 2019-06-11 NOTE — Assessment & Plan Note (Signed)
Cellulitis, right hand: I wonder if this started as a contact dermatitis with severe itching and now has developed into cellulitis due to skin openings. Plan: Rx Keflex, tolerated cephalosporins well in the past. Also topical steroid. Call if not better, call if worse.  See AVS

## 2019-06-15 NOTE — Progress Notes (Signed)
Virtual Visit via Telephone Note   This visit type was conducted due to national recommendations for restrictions regarding the COVID-19 Pandemic (e.g. social distancing) in an effort to limit this patient's exposure and mitigate transmission in our community.  Due to her co-morbid illnesses, this patient is at least at moderate risk for complications without adequate follow up.  This format is felt to be most appropriate for this patient at this time.  The patient did not have access to video technology/had technical difficulties with video requiring transitioning to audio format only (telephone).  All issues noted in this document were discussed and addressed.  No physical exam could be performed with this format.  Please refer to the patient's chart for her  consent to telehealth for [facility]. Virtual platform was offered given ongoing worsening Covid-19 pandemic.  Date:  06/19/2019   ID:  [name] [name], DOB 21-Jul-1940, MRN [id number]  Patient Location: Home Provider Location: [facility]  PCP:  Colon Branch, MD  Cardiologist:  Skeet Latch, MD  Electrophysiologist:  None   Evaluation Performed:  Follow-Up Visit  Chief Complaint:  Follow-up of CAD, cardiomyopathy, and atrial fibrillation  History of Present Illness:    [name] [name] is a 79 y.o. female with a history of CAD with NSTEMI in 05/2015 s/p POBA to OM1, ischemic cardiomyopathy, paroxysmal atrial fibrillation/flutter s/p remote flutter ablation on Coumadin, SVT, hypertension, hyperlipidemia, and type 2 diabetes mellitus, GERD, CKD stage III, and seizure disorder who is followed by Dr. Oval Linsey and presents today for follow-up.   Patient presented with a NSTEMI in 05/2015. Cardiac catheterization at that time showed 100% stenosis of OM1 which was treated with balloon angioplasty. No stent was placed. At the end of the procedure, initial occlusion site had no residual stenosis; however, there did remain a large  branch downstream which remained occluded and did not respond to angioplasty. Echo showed LVEF of 35-40% with diffuse hypokinesis and mildly to moderately increase PASP of 45 mmHg. Patient has done well from a cardiac standpoint since then. She was last seen by Kerin Ransom, PA-C, in 04/2018 at which time she was doing well.   Since last office visit, she was hospitalized in 07/2018 with low back pain and found to have a lumbar compression fraction. Did not require surgery. No cardiac issues during this admission. She was recently seen by her PCP on 06/09/2019 and diagnosed with cellulitis of her right hand. She was started on Keflex and topical steroids.   Patient presents today for follow-up via telephone visit. Patient doing well since last visit. She has been able to be more active since last visit and has been walking more. She denies any chest pain, shortness of breath, orthopnea, PND, lower extremity edema, palpitations, lightheadedness, dizziness,s or syncope. No abnormal bleeding on Coumadin. Right hand swelling improving with antibiotic. She has lost 18 lbs since last visit. She reports decreased appetite but denies any fevers, chills, or night sweats. She has discussed this with her PCP.   Past Medical History:  Diagnosis Date  . Atrial fibrillation ([place])    SVT dx 2007, cath 2007 mild CAD, then had an cardioversion, ablation; still on coumadin , has occ palpitation, EKG 03-2010 NSR  . Blindness of left eye   . CKD (chronic kidney disease), stage III 06/16/2016   [name] 06/17/2016  . DIABETES MELLITUS, TYPE II 03/27/2006   dr [name]  . Eye muscle weakness    Right eye weakness after cataract surgery  .  GERD (gastroesophageal reflux disease) 10/05/2011  . 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 HYPERLIPIDEMIA 03/27/2006  . HYPERTENSION 03/27/2006  . Intertrochanteric fracture of right hip ([place]) 07/13/2012  . 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 Osteopenia 2004   Dexa 2004 showed Osteopenia, DEXA 03/2007 normal  . Osteopenia   . Other chronic cystitis with hematuria   . Pelvic fracture ([place]) 06/16/2016   S/P fall  . Recurrent urinary tract infection    Seeing Urology  . 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 Seizures ([place])   . 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[place]) 5/46/2703   Past Surgical History:  Procedure Laterality Date  . ANKLE FRACTURE SURGERY Bilateral 08/2015   "right one was in 2 places"  . CARDIAC CATHETERIZATION N/A 05/17/2015   Procedure: Left Heart Cath and Coronary Angiography;  Surgeon: Jettie Booze, MD;  Location: [place] [facility];  Service: Cardiovascular;  Laterality: N/A;  . CARDIAC CATHETERIZATION N/A 05/17/2015   Procedure: Coronary Balloon Angioplasty;  Surgeon: Jettie Booze, MD;  Location: [place] [facility];  Service: Cardiovascular;  Laterality: N/A;  . FEMUR IM NAIL Right 07/15/2012   Procedure: INTRAMEDULLARY (IM) NAIL HIP;  Surgeon: Johnny Bridge, MD;  Location: [place];  Service: Orthopedics;  Laterality: Right;  . FEMUR IM NAIL Left 12/02/2015   Procedure: INTRAMEDULLARY (IM) NAIL FEMORAL;  Surgeon: [name], MD;  Location: [place];  Service: Orthopedics;  Laterality: Left;  . FRACTURE SURGERY    . INCISION AND DRAINAGE HIP Left 01/03/2016   Procedure: IRRIGATION AND DEBRIDEMENT HIP;  Surgeon: [name], MD;  Location: [place];  Service: Orthopedics;  Laterality: Left;  . SHOULDER SURGERY Bilateral    "don't remember which side"     Current Meds  Medication Sig  . alendronate (FOSAMAX) 70 MG tablet Take 1 tablet (70 mg total) by mouth every 7 (seven) days. Take with a full glass of water on an empty stomach.  [name] aspirin EC 81 MG tablet Take 1 tablet (81 mg total) by mouth daily.  [name] atorvastatin (LIPITOR) 80 MG tablet Take 1 tablet (80 mg total) by mouth daily.  [name] augmented betamethasone dipropionate (DIPROLENE-AF) 0.05 % cream Apply topically 2 (two) times daily.  . calcium-vitamin D (OSCAL  WITH D) 500-200 MG-UNIT per tablet Take 1 tablet by mouth daily.  . furosemide (LASIX) 40 MG tablet Take 1 tablet (40 mg total) by mouth 2 (two) times daily as needed for fluid.  [name] insulin NPH-regular Human (NOVOLIN 70/30 RELION) (70-30) 100 UNIT/ML injection 24 UNITS WITH BREAKFAST AND 4 UNITS WITH SUPPER  . Multiple Vitamins-Minerals (MULTIVITAMIN ADULT PO) Take 1 tablet by mouth daily with breakfast.   . warfarin (COUMADIN) 2.5 MG tablet TAKE AS DIRECTED PER COUMADIN CLINIC  . [DISCONTINUED] pantoprazole (PROTONIX) 40 MG tablet Take 1 tablet (40 mg total) by mouth daily.     Allergies:   Procaine hcl and Amoxicillin   Social History   Tobacco Use  . Smoking status: Former Smoker    Types: Cigarettes    Quit date: 03/02/1978    Years since quitting: 41.3  . Smokeless tobacco: Never Used  Substance Use Topics  . Alcohol use: No    Alcohol/week: 0.0 standard drinks  . Drug use: No     Family Hx: The patient's family history includes Diabetes in her father and sister; Drug abuse in her son; Heart attack (age  of onset: 60) in her father. There is no history of Cancer.  ROS:   Please see the history of present illness.    All other systems reviewed and are negative.   Prior CV studies:    The following studies were reviewed today:   Echocardiogram 05/10/2015: Study Conclusions: - Left ventricle: The cavity size was normal. There was moderate  concentric hypertrophy. Systolic function was moderately reduced.  The estimated ejection fraction was in the range of 35% to 40%.  Diffuse hypokinesis. The study was not technically sufficient to  allow evaluation of LV diastolic dysfunction due to atrial  fibrillation.  - Aortic valve: Trileaflet; normal thickness leaflets. There was no  regurgitation.  - Aortic root: The aortic root was normal in size.  - Mitral valve: Mildly thickened leaflets . There was mild  regurgitation.  - Left atrium: The atrium was moderately  dilated.  - Right ventricle: The cavity size was normal. Wall thickness was  normal. Systolic function was normal.  - Right atrium: The atrium was normal in size.  - Tricuspid valve: There was moderate regurgitation.  - Pulmonic valve: There was no regurgitation.  - Pulmonary arteries: Systolic pressure was mildly to moderately  increased. PA peak pressure: 45 mm Hg (S).  - Inferior vena cava: The vessel was normal in size.  - Pericardium, extracardiac: There was no pericardial effusion.  _______________  Left Heart Catheterization 05/17/2015:  There is moderate left ventricular systolic dysfunction.  1st Mrg lesion, 100% stenosed. Intervention was performed with balloon angioplasty. No stent was placed.  At the end of the procedure, the initial occlusion site had no residual stenosis. There did remain a large branch downstream of the initial occlusion which remained occluded and did not respond to angioplasty. There is a smaller branch which had TIMI-3 flow restored. The patient was pain-free at the end of the procedure.  Moderate LV dysfunction. Moderately elevated LVEDP.   Will stop anticoagulation at this point. Oral antiplatelet therapy was not given due to this vague history of rectal bleeding. No stent was placed. If there is no contraindication of bleeding, would load the patient with clopidogrel 300 mg followed by 75 mg daily. She will also have to go back on Coumadin. Risk-benefit decision regarding additional antiplatelet therapy will have to be performed.  Continue aggressive secondary prevention including high-dose statin and therapy for LV dysfunction.  Labs/Other Tests and Data Reviewed:    EKG: Last EKG from 04/17/2018 personally reviewed and showed: Sinus bradycardia, rate 59 bpm, with non-specific T wave flattening in inferior leads and leads I and aVL as well as T wave inversion in leads V5-V6 (new from 2018). Borderline 1st degree AV blocker. Normal QRS interval QTc  441 ms.   Recent Labs: 06/30/2018: ALT 16 07/19/2018: Hemoglobin 12.2; Platelets 399 01/04/2019: BUN 26; Creatinine, Ser 1.64; Potassium 4.2; Sodium 142   Recent Lipid Panel Lab Results  Component Value Date/Time   CHOL 131 01/04/2019 09:53 AM   TRIG 116.0 01/04/2019 09:53 AM   HDL 44.30 01/04/2019 09:53 AM   CHOLHDL 3 01/04/2019 09:53 AM   LDLCALC 63 01/04/2019 09:53 AM    Wt Readings from Last 3 Encounters:  06/19/19 174 lb (78.9 kg)  06/09/19 182 lb (82.6 kg)  05/03/19 180 lb 12.8 oz (82 kg)     Objective:    Vital Signs:  BP (!) 125/47   Pulse 63   Ht 5\' 9"  (1.753 m)   Wt 174 lb (78.9 kg)  SpO2 96%   BMI 25.70 kg/m    VS Reviewed. General: No acute distress. Pulm: No labored breathing. No coughing during visit. No audible wheezing. Speaking in full sentences. Neuro: Alert and oriented. No slurred speech. Answers questions appropriately. Psych: Pleasant affect.  ASSESSMENT & PLAN:    CAD without Angina - History of NSETMI in 05/2015 treated with POBA to OM1.  - Stable. No angina.  - Continue aspirin and statin.   Ischemic Cardiomyopathy  - Echo in 2017 showed LVEF of 35-40% with diffuse hypokinesis. - Continue Lasix 40mg  twice daily as needed. She has not needed this in a long time.  - Previously on Toprol-XL  - Continue Toprol and Lisinopril but no longer on either. Toprol has been discontinued since last visit - patient cannot remember why and I cannot tell. Possibly due to bradycardia as heart rates are in the low 60's without any medication.  - Patient has no anginal or CHF symptoms so will hold off on Echo as this time but may consider repeat at follow-up visit.   Paroxysmal Atrial Fibrillation/ Atrial Flutter s/p Ablation / History of SVT - Virtual visit today so unable to tell if patient is in sinus rhythm. Rate controlled.  - No palpitations or recurrent tachyarrhythmias.  - No longer on Toprol as stated above.  - Continue chronic anticoagulation with  Coumadin.   Hypertension - BP well controlled without medications.   Hyperlipidemia - Most recent lipid panel from 01/2019: Total Cholesterol 131, Triglycerides 116, HDL 44, LDL 63. - LDL goal <70 given CAD.  - Continue Lipitor 80mg  daily.  Type 2 Diabetes Mellitus  - On Insulin at home. - Management per primary team.   Chronic Kidney Disease Stage III - Creatinine 1.64 in 01/2019. Baseline seems to be around 1.5 to 1.9.   Time:   Today, I have spent 11 minutes with the patient with telehealth technology discussing the above problems.     Medication Adjustments/Labs and Tests Ordered: Current medicines are reviewed at length with the patient today.  Concerns regarding medicines are outlined above.   Follow Up:  In Person in 6 month(s) with Dr. Oval Linsey.   Signed, Darreld Mclean, PA-C  06/19/2019 12:41 PM    [facility]

## 2019-06-19 ENCOUNTER — Encounter: Payer: Self-pay | Admitting: Student

## 2019-06-19 ENCOUNTER — Telehealth (INDEPENDENT_AMBULATORY_CARE_PROVIDER_SITE_OTHER): Payer: Medicare Other | Admitting: Student

## 2019-06-19 VITALS — BP 125/47 | HR 63 | Ht 69.0 in | Wt 174.0 lb

## 2019-06-19 DIAGNOSIS — N1832 Chronic kidney disease, stage 3b: Secondary | ICD-10-CM

## 2019-06-19 DIAGNOSIS — I480 Paroxysmal atrial fibrillation: Secondary | ICD-10-CM

## 2019-06-19 DIAGNOSIS — I10 Essential (primary) hypertension: Secondary | ICD-10-CM | POA: Diagnosis not present

## 2019-06-19 DIAGNOSIS — I255 Ischemic cardiomyopathy: Secondary | ICD-10-CM

## 2019-06-19 DIAGNOSIS — E118 Type 2 diabetes mellitus with unspecified complications: Secondary | ICD-10-CM

## 2019-06-19 DIAGNOSIS — E785 Hyperlipidemia, unspecified: Secondary | ICD-10-CM

## 2019-06-19 DIAGNOSIS — Z794 Long term (current) use of insulin: Secondary | ICD-10-CM

## 2019-06-19 DIAGNOSIS — I2510 Atherosclerotic heart disease of native coronary artery without angina pectoris: Secondary | ICD-10-CM

## 2019-06-19 NOTE — Patient Instructions (Signed)
Medication Instructions:  Your physician recommends that you continue on your current medications as directed. Please refer to the Current Medication list given to you today.  *If you need a refill on your cardiac medications before your next appointment, please call your pharmacy*  Follow-Up: At [facility], you and your health needs are our priority.  As part of our continuing mission to provide you with exceptional heart care, we have created designated Provider Care Teams.  These Care Teams include your primary Cardiologist (physician) and Advanced Practice Providers (APPs -  Physician Assistants and Nurse Practitioners) who all work together to provide you with the care you need, when you need it.  We recommend signing up for the patient portal called "MyChart".  Sign up information is provided on this After Visit Summary.  MyChart is used to connect with patients for Virtual Visits (Telemedicine).  Patients are able to view lab/test results, encounter notes, upcoming appointments, etc.  Non-urgent messages can be sent to your provider as well.   To learn more about what you can do with MyChart, go to [email or web address].    Your next appointment:   6 month(s)  The format for your next appointment:   In Person  Provider:   Skeet Latch, MD

## 2019-06-21 ENCOUNTER — Other Ambulatory Visit: Payer: Self-pay | Admitting: Internal Medicine

## 2019-06-23 ENCOUNTER — Other Ambulatory Visit: Payer: Self-pay

## 2019-06-23 ENCOUNTER — Ambulatory Visit (INDEPENDENT_AMBULATORY_CARE_PROVIDER_SITE_OTHER): Payer: Medicare Other | Admitting: *Deleted

## 2019-06-23 DIAGNOSIS — I480 Paroxysmal atrial fibrillation: Secondary | ICD-10-CM

## 2019-06-23 DIAGNOSIS — I482 Chronic atrial fibrillation, unspecified: Secondary | ICD-10-CM

## 2019-06-23 DIAGNOSIS — Z5181 Encounter for therapeutic drug level monitoring: Secondary | ICD-10-CM

## 2019-06-23 LAB — POCT INR: INR: 1.7 — AB (ref 2.0–3.0)

## 2019-06-23 NOTE — Patient Instructions (Addendum)
Description   Take 1.5 tablets today, then continue taking 1 tablet daily. Recheck INR in 3 weeks.  Call us with any medication changes [phone] to Coumadin clinic.

## 2019-07-09 ENCOUNTER — Other Ambulatory Visit: Payer: Self-pay | Admitting: Internal Medicine

## 2019-07-10 ENCOUNTER — Other Ambulatory Visit: Payer: Self-pay

## 2019-07-10 MED ORDER — ALENDRONATE SODIUM 70 MG PO TABS: 70.0000 mg | ORAL_TABLET | ORAL | 3 refills | Status: DC

## 2019-07-14 ENCOUNTER — Other Ambulatory Visit: Payer: Self-pay

## 2019-07-14 ENCOUNTER — Ambulatory Visit (INDEPENDENT_AMBULATORY_CARE_PROVIDER_SITE_OTHER): Payer: Medicare Other

## 2019-07-14 DIAGNOSIS — I482 Chronic atrial fibrillation, unspecified: Secondary | ICD-10-CM | POA: Diagnosis not present

## 2019-07-14 DIAGNOSIS — Z5181 Encounter for therapeutic drug level monitoring: Secondary | ICD-10-CM | POA: Diagnosis not present

## 2019-07-14 DIAGNOSIS — I480 Paroxysmal atrial fibrillation: Secondary | ICD-10-CM | POA: Diagnosis not present

## 2019-07-14 LAB — POCT INR: INR: 2.6 (ref 2.0–3.0)

## 2019-07-14 NOTE — Patient Instructions (Signed)
Description   Continue taking 1 tablet daily. Recheck INR in 4 weeks.  Call us with any medication changes [phone] to Coumadin clinic.

## 2019-08-04 ENCOUNTER — Ambulatory Visit (INDEPENDENT_AMBULATORY_CARE_PROVIDER_SITE_OTHER): Payer: Medicare Other | Admitting: Internal Medicine

## 2019-08-04 ENCOUNTER — Other Ambulatory Visit: Payer: Self-pay

## 2019-08-04 VITALS — BP 138/70 | HR 63 | Resp 17 | Ht 69.0 in | Wt 179.0 lb

## 2019-08-04 DIAGNOSIS — I10 Essential (primary) hypertension: Secondary | ICD-10-CM | POA: Diagnosis not present

## 2019-08-04 DIAGNOSIS — I5021 Acute systolic (congestive) heart failure: Secondary | ICD-10-CM

## 2019-08-04 DIAGNOSIS — I255 Ischemic cardiomyopathy: Secondary | ICD-10-CM

## 2019-08-04 DIAGNOSIS — I429 Cardiomyopathy, unspecified: Secondary | ICD-10-CM

## 2019-08-04 LAB — COMPREHENSIVE METABOLIC PANEL
ALT: 15 U/L (ref 0–35)
AST: 24 U/L (ref 0–37)
Albumin: 3.6 g/dL (ref 3.5–5.2)
Alkaline Phosphatase: 85 U/L (ref 39–117)
BUN: 22 mg/dL (ref 6–23)
CO2: 30 mEq/L (ref 19–32)
Calcium: 9.1 mg/dL (ref 8.4–10.5)
Chloride: 99 mEq/L (ref 96–112)
Creatinine, Ser: 1.51 mg/dL — ABNORMAL HIGH (ref 0.40–1.20)
GFR: 33.36 mL/min — ABNORMAL LOW (ref >60.00)
Glucose, Bld: 292 mg/dL — ABNORMAL HIGH (ref 70–99)
Potassium: 4.6 mEq/L (ref 3.5–5.1)
Sodium: 136 mEq/L (ref 135–145)
Total Bilirubin: 0.6 mg/dL (ref 0.2–1.2)
Total Protein: 6.4 g/dL (ref 6.0–8.3)

## 2019-08-04 LAB — CBC
HCT: 36.8 % (ref 36.0–46.0)
Hemoglobin: 12.3 g/dL (ref 12.0–15.0)
MCHC: 33.3 g/dL (ref 30.0–36.0)
MCV: 92.6 fl (ref 78.0–100.0)
Platelets: 265 10*3/uL (ref 150.0–400.0)
RBC: 3.98 Mil/uL (ref 3.87–5.11)
RDW: 13.5 % (ref 11.5–15.5)
WBC: 7 10*3/uL (ref 4.0–10.5)

## 2019-08-04 NOTE — Progress Notes (Signed)
Subjective:    Patient ID: [name] [name], female    DOB: [birth year], 79 y.o.   MRN: [id number]  DOS:  08/04/2019 Type of visit - description: Routine visit Since the last office visit she is doing well. Has no major concerns.     Review of Systems Denies chest pain or difficulty breathing No lower extremity edema Denies any blood in the urine or stools. No major problems with anxiety or depression  Past Medical History:  Diagnosis Date  . Atrial fibrillation ([place])    SVT dx 2007, cath 2007 mild CAD, then had an cardioversion, ablation; still on coumadin , has occ palpitation, EKG 03-2010 NSR  . Blindness of left eye   . CKD (chronic kidney disease), stage III 06/16/2016   [name] 06/17/2016  . DIABETES MELLITUS, TYPE II 03/27/2006   dr [name]  . Eye muscle weakness    Right eye weakness after cataract surgery  . GERD (gastroesophageal reflux disease) 10/05/2011  . 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 HYPERLIPIDEMIA 03/27/2006  . HYPERTENSION 03/27/2006  . Intertrochanteric fracture of right hip ([place]) 07/13/2012  . 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 Osteopenia 2004   Dexa 2004 showed Osteopenia, DEXA 03/2007 normal  . Osteopenia   . Other chronic cystitis with hematuria   . Pelvic fracture ([place]) 06/16/2016   S/P fall  . Recurrent urinary tract infection    Seeing Urology  . 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 Seizures ([place])   . 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[place]) 5/64/3329    Past Surgical History:  Procedure Laterality Date  . ANKLE FRACTURE SURGERY Bilateral 08/2015   "right one was in 2 places"  . CARDIAC CATHETERIZATION N/A 05/17/2015   Procedure: Left Heart Cath and Coronary Angiography;  Surgeon: Jettie Booze, MD;  Location: [place] [facility];  Service: Cardiovascular;  Laterality: N/A;  . CARDIAC CATHETERIZATION N/A 05/17/2015   Procedure: Coronary Balloon Angioplasty;  Surgeon: Jettie Booze, MD;   Location: [place] [facility];  Service: Cardiovascular;  Laterality: N/A;  . FEMUR IM NAIL Right 07/15/2012   Procedure: INTRAMEDULLARY (IM) NAIL HIP;  Surgeon: Johnny Bridge, MD;  Location: [place];  Service: Orthopedics;  Laterality: Right;  . FEMUR IM NAIL Left 12/02/2015   Procedure: INTRAMEDULLARY (IM) NAIL FEMORAL;  Surgeon: [name], MD;  Location: [place];  Service: Orthopedics;  Laterality: Left;  . FRACTURE SURGERY    . INCISION AND DRAINAGE HIP Left 01/03/2016   Procedure: IRRIGATION AND DEBRIDEMENT HIP;  Surgeon: [name], MD;  Location: [place];  Service: Orthopedics;  Laterality: Left;  . SHOULDER SURGERY Bilateral    "don't remember which side"    Allergies as of 08/04/2019      Reactions   Procaine Hcl Anaphylaxis   Amoxicillin Itching   Did it involve swelling of the face/tongue/throat, SOB, or low BP? No Did it involve sudden or severe rash/hives, skin peeling, or any reaction on the inside of your mouth or nose? No Did you need to seek medical attention at a hospital or doctor's office? No When did it last happen?unk If all above answers are "NO", may proceed with cephalosporin use.      Medication List       Accurate as of August 04, 2019  1:07 PM. If you have any questions, ask your nurse or doctor.        alendronate  70 MG tablet Commonly known as: FOSAMAX Take 1 tablet (70 mg total) by mouth every 7 (seven) days. Take with a full glass of water on an empty stomach.   aspirin EC 81 MG tablet Take 1 tablet (81 mg total) by mouth daily.   atorvastatin 80 MG tablet Commonly known as: LIPITOR Take 1 tablet (80 mg total) by mouth daily.   augmented betamethasone dipropionate 0.05 % cream Commonly known as: DIPROLENE-AF Apply topically 2 (two) times daily.   calcium-vitamin D 500-200 MG-UNIT tablet Commonly known as: OSCAL WITH D Take 1 tablet by mouth daily.   furosemide 40 MG tablet Commonly known as: LASIX Take 1 tablet (40 mg total) by mouth 2 (two)  times daily as needed for fluid.   MULTIVITAMIN ADULT PO Take 1 tablet by mouth daily with breakfast.   NovoLIN 70/30 ReliOn (70-30) 100 UNIT/ML injection Generic drug: insulin NPH-regular Human 24 UNITS WITH BREAKFAST AND 4 UNITS WITH SUPPER   warfarin 2.5 MG tablet Commonly known as: COUMADIN Take as directed by the anticoagulation clinic. If you are unsure how to take this medication, talk to your nurse or doctor. Original instructions: TAKE AS DIRECTED PER COUMADIN CLINIC          Objective:   Physical Exam BP 138/70 (BP Location: Right Arm, Patient Position: Sitting, Cuff Size: Large)   Pulse 63   Resp 17   Ht 5\' 9"  (1.753 m)   Wt 179 lb (81.2 kg)   SpO2 98%   BMI 26.43 kg/m  General:   Well developed, NAD, BMI noted. HEENT:  Normocephalic . Face symmetric, atraumatic Lungs:  CTA B Normal respiratory effort, no intercostal retractions, no accessory muscle use. Heart: Seems regular today,  no murmur.  Lower extremities: no pretibial edema bilaterally  Skin: Not pale. Not jaundice Neurologic:  alert & oriented X3.  Speech normal, gait limited, assisted by a roller. Psych--  Cognition and judgment appear intact.  Cooperative with normal attention span and concentration.  Behavior appropriate. No anxious or depressed appearing.      Assessment     Assessment   DM  + retinopathy, Dr [name] HTN CKD: Creatinine 1.6 = GFR ~ 26 Hyperlipidemia anxiety ativan GERD Elevated creatinine(started after admission 05-2015, had IV contrast) Anemia, normal iron 05-2015, never had a colonoscopy CV ---NSTEMI 05-2015, cath, angiplasty ---Echo, EF 40 % (05-2015) ---Atrial fibrillation, SVT -- dx 2007, cath 2007 mild CAD. 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),  sz episode 08-2015 (?) - HAs Blindness, left eye Right eye weakness after cataract surgery Osteoporosis: s/p DEXA 2004, DEXA 2009 normal, DEXA 2015 @ [facility] -1.8 but had L1 vertebral fracture 2020 H/o lung bx 2008 benign   PLAN: DM: Per Endo HTN: BP today is very good, at home is typically in the 130s/70s, on no medications. Cardiomyopathy, CAD, A. fib: Seen by cardiology 06/19/2019, they noted she was not on beta-blockers or lisinopril but was felt to be stable.  No changes made. Check CMP and CBC Osteoporosis: On Fosamax, did not pursue a DEXA and is not interested at this point.  Taking calcium and vitamin D.  Reassess periodically. Had Covid shots RTC 4 months   This visit occurred during the SARS-CoV-2 public health emergency.  Safety protocols were in place, including screening questions prior to the visit,  additional usage of staff PPE, and extensive cleaning of exam room while observing appropriate contact time as indicated for disinfecting solutions.

## 2019-08-04 NOTE — Patient Instructions (Signed)
Continue checking your blood pressures  BP GOAL is between 110/65 and  135/85. If it is consistently higher or lower, let me know    GO TO THE LAB : Get the blood work     GO TO THE FRONT DESK, PLEASE SCHEDULE YOUR APPOINTMENTS Come back for   a checkup in 4 months 

## 2019-08-06 NOTE — Assessment & Plan Note (Signed)
DM: Per Endo HTN: BP today is very good, at home is typically in the 130s/70s, on no medications. Cardiomyopathy, CAD, A. fib: Seen by cardiology 06/19/2019, they noted she was not on beta-blockers or lisinopril but was felt to be stable.  No changes made. Check CMP and CBC Osteoporosis: On Fosamax, did not pursue a DEXA and is not interested at this point.  Taking calcium and vitamin D.  Reassess periodically. Had Covid shots RTC 4 months

## 2019-08-11 ENCOUNTER — Ambulatory Visit (INDEPENDENT_AMBULATORY_CARE_PROVIDER_SITE_OTHER): Payer: Medicare Other | Admitting: *Deleted

## 2019-08-11 ENCOUNTER — Other Ambulatory Visit: Payer: Self-pay

## 2019-08-11 DIAGNOSIS — Z5181 Encounter for therapeutic drug level monitoring: Secondary | ICD-10-CM

## 2019-08-11 DIAGNOSIS — I480 Paroxysmal atrial fibrillation: Secondary | ICD-10-CM | POA: Diagnosis not present

## 2019-08-11 DIAGNOSIS — I482 Chronic atrial fibrillation, unspecified: Secondary | ICD-10-CM

## 2019-08-11 LAB — POCT INR: INR: 2.5 (ref 2.0–3.0)

## 2019-08-11 NOTE — Patient Instructions (Signed)
Description   Continue taking 1 tablet daily. Recheck INR in 5 weeks.  Call us with any medication changes [phone] to Coumadin Clinic.

## 2019-08-29 ENCOUNTER — Ambulatory Visit: Payer: Medicare Other | Admitting: Orthopaedic Surgery

## 2019-09-05 ENCOUNTER — Ambulatory Visit: Payer: Self-pay

## 2019-09-05 ENCOUNTER — Ambulatory Visit (INDEPENDENT_AMBULATORY_CARE_PROVIDER_SITE_OTHER): Payer: Medicare Other | Admitting: Orthopaedic Surgery

## 2019-09-05 DIAGNOSIS — M545 Low back pain, unspecified: Secondary | ICD-10-CM

## 2019-09-05 DIAGNOSIS — G8929 Other chronic pain: Secondary | ICD-10-CM

## 2019-09-05 NOTE — Progress Notes (Signed)
Office Visit Note   Patient: [name] [name]           Date of Birth: [birth year]           MRN: [id number] Visit Date: 09/05/2019              Requested by: Colon Branch, [address] [place],  [place] [address] PCP: Colon Branch, MD   Assessment & Plan: Visit Diagnoses:  1. Chronic bilateral low back pain, unspecified whether sciatica present     Plan: Continue Fosamax so she has been on a year and a half then she can stop for 6 months.  Continue calcium and vitamin D.  Follow-up as needed.  Follow-Up Instructions: No follow-ups on file.   Orders:  Orders Placed This Encounter  Procedures  . XR Lumbar Spine 2-3 Views   No orders of the defined types were placed in this encounter.     Procedures: No procedures performed   Clinical Data: No additional findings.   Subjective: Chief Complaint  Patient presents with  . Lower Back - Follow-up    HPI 1 year follow-up L1 compression fracture.  Patient is using a rolling walker with reverse seat she is at previous in her truck fractures fixed.  She states her back is doing well no new falls or injuries.  She does have problems with stage III kidney disease diabetes, atrial fib previous cardiac angioplasties cardiomyopathy and GERD.  Review of Systems updated unchanged from 08/26/2018 office visit other than as mentioned above.   Objective: Vital Signs: There were no vitals taken for this visit.  Physical Exam Constitutional:      Appearance: She is well-developed.  HENT:     Head: Normocephalic.     Right Ear: External ear normal.     Left Ear: External ear normal.  Eyes:     Pupils: Pupils are equal, round, and reactive to light.  Neck:     Thyroid: No thyromegaly.     Trachea: No tracheal deviation.  Cardiovascular:     Rate and Rhythm: Normal rate.  Pulmonary:     Effort: Pulmonary effort is normal.  Abdominal:     Palpations: Abdomen is soft.  Skin:    General: Skin is warm and dry.   Neurological:     Mental Status: She is alert and oriented to person, place, and time.  Psychiatric:        Behavior: Behavior normal.     Ortho Exam patient gets from sitting to standing she is amatory with a rolling walker.  Sensation of the anterior thighs are intact.  Negative logroll the hips.  Specialty Comments:  No specialty comments available.  Imaging: XR Lumbar Spine 2-3 Views  Result Date: 09/05/2019 AP lateral lumbar spine x-rays are obtained and reviewed.  This shows stable L1 compression fracture unchanged from 08/26/2018 images.  Multilevel disc degeneration is again noted. Impression: Stable old L1 compression fracture with no new ration fractures noted.    PMFS History: Patient Active Problem List   Diagnosis Date Noted  . 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 Osteoporosis 07/22/2018  . Closed compression fracture of L1 vertebra ([place]) 07/19/2018  . Closed compression fracture of body of L1 vertebra ([facility])   . Closed L1 vertebral fracture ([place]) 07/18/2018  . Cardiomyopathy ([place]) 04/07/2018  . Foot ulcer ([place]) 07/24/2016  . Gram-negative infection   . CKD (chronic kidney disease) stage 3, GFR 30-59 ml/min (HCC)   .  Postoperative anemia due to acute blood loss 01/04/2016  . Left hip postoperative wound infection 01/02/2016  . CAD S/P percutaneous coronary angioplasty 05/20/2015  . Acute systolic CHF (congestive heart failure) ([place]) 05/20/2015  . Acute kidney injury superimposed on chronic kidney disease ([place]) 05/11/2015  . PCP NOTES >>>>>>>>>>>>>>>> 12/28/2014  . Memory loss 05/17/2014  . Diabetes ([place]) 01/23/2014  . Anxiety and depression, PCP notes  11/24/2013  . Severe obesity (BMI >= 40) ([place]) 07/14/2013  . Encounter for therapeutic drug monitoring 06/02/2013  . History of encephalitis 11/18/2012  . Intertrochanteric fracture of right hip ([place]) 07/13/2012  . GERD (gastroesophageal reflux disease) 10/05/2011  . Seizure disorder ([place]) 05/24/2011  .  Annual physical exam >>>>>>>>>>>>>>>>>>>>>>>>>>>>>>>.. 02/10/2011  . Atrial fibrillation ([place])   . 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 Hyperlipidemia 03/27/2006  . Hereditary and idiopathic peripheral neuropathy 03/27/2006  . RETINOPATHY, BACKGROUND NOS 03/27/2006  . Essential hypertension 03/27/2006   Past Medical History:  Diagnosis Date  . Atrial fibrillation ([place])    SVT dx 2007, cath 2007 mild CAD, then had an cardioversion, ablation; still on coumadin , has occ palpitation, EKG 03-2010 NSR  . Blindness of left eye   . CKD (chronic kidney disease), stage III 06/16/2016   [name] 06/17/2016  . DIABETES MELLITUS, TYPE II 03/27/2006   dr [name]  . Eye muscle weakness    Right eye weakness after cataract surgery  . GERD (gastroesophageal reflux disease) 10/05/2011  . 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 HYPERLIPIDEMIA 03/27/2006  . HYPERTENSION 03/27/2006  . Intertrochanteric fracture of right hip ([place]) 07/13/2012  . 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 Osteopenia 2004   Dexa 2004 showed Osteopenia, DEXA 03/2007 normal  . Osteopenia   . Other chronic cystitis with hematuria   . Pelvic fracture ([place]) 06/16/2016   S/P fall  . Recurrent urinary tract infection    Seeing Urology  . RETINOPATHY, BACKGROUND NOS 03/27/2006  . Seizures ([place])   . 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[place]) 1/57/2620    Family History  Problem Relation Age of Onset  . Diabetes Father   . Heart attack Father 68  . Diabetes Sister   . Drug abuse Son   . Cancer Neg Hx        no hx of colon or breast cancer    Past Surgical History:  Procedure Laterality Date  . ANKLE FRACTURE SURGERY Bilateral 08/2015   "right one was in 2 places"  . CARDIAC CATHETERIZATION N/A 05/17/2015   Procedure: Left Heart Cath and Coronary Angiography;  Surgeon: Jettie Booze, MD;  Location: [facility];  Service: Cardiovascular;  Laterality: N/A;  . CARDIAC CATHETERIZATION N/A 05/17/2015    Procedure: Coronary Balloon Angioplasty;  Surgeon: Jettie Booze, MD;  Location: [facility];  Service: Cardiovascular;  Laterality: N/A;  . FEMUR IM NAIL Right 07/15/2012   Procedure: INTRAMEDULLARY (IM) NAIL HIP;  Surgeon: Johnny Bridge, MD;  Location: [facility];  Service: Orthopedics;  Laterality: Right;  . FEMUR IM NAIL Left 12/02/2015   Procedure: INTRAMEDULLARY (IM) NAIL FEMORAL;  Surgeon: [name], MD;  Location: [facility];  Service: Orthopedics;  Laterality: Left;  . FRACTURE SURGERY    . INCISION AND DRAINAGE HIP Left 01/03/2016   Procedure: IRRIGATION AND DEBRIDEMENT HIP;  Surgeon: [name], MD;  Location: [facility];  Service: Orthopedics;  Laterality: Left;  . SHOULDER SURGERY Bilateral    "  don't remember which side"   Social History   Occupational History  . Occupation: retired     Fish farm manager: RETIRED  Tobacco Use  . Smoking status: Former Smoker    Types: Cigarettes    Quit date: 03/02/1978    Years since quitting: 41.5  . Smokeless tobacco: Never Used  Substance and Sexual Activity  . Alcohol use: No    Alcohol/week: 0.0 standard drinks  . Drug use: No  . Sexual activity: Not Currently

## 2019-09-07 ENCOUNTER — Ambulatory Visit (INDEPENDENT_AMBULATORY_CARE_PROVIDER_SITE_OTHER): Payer: Medicare Other | Admitting: Endocrinology

## 2019-09-07 ENCOUNTER — Encounter: Payer: Self-pay | Admitting: Endocrinology

## 2019-09-07 ENCOUNTER — Other Ambulatory Visit: Payer: Self-pay

## 2019-09-07 VITALS — BP 110/68 | HR 67 | Ht 69.0 in | Wt 177.2 lb

## 2019-09-07 DIAGNOSIS — Z794 Long term (current) use of insulin: Secondary | ICD-10-CM

## 2019-09-07 DIAGNOSIS — I255 Ischemic cardiomyopathy: Secondary | ICD-10-CM | POA: Diagnosis not present

## 2019-09-07 DIAGNOSIS — N1832 Chronic kidney disease, stage 3b: Secondary | ICD-10-CM

## 2019-09-07 DIAGNOSIS — E1121 Type 2 diabetes mellitus with diabetic nephropathy: Secondary | ICD-10-CM | POA: Diagnosis not present

## 2019-09-07 DIAGNOSIS — E1159 Type 2 diabetes mellitus with other circulatory complications: Secondary | ICD-10-CM

## 2019-09-07 LAB — POCT GLYCOSYLATED HEMOGLOBIN (HGB A1C): Hemoglobin A1C: 8 % — AB (ref 4.0–5.6)

## 2019-09-07 NOTE — Patient Instructions (Signed)
Please continue the same insulin.  On this type of insulin schedule, you should eat meals on a regular schedule.  If a meal is missed or significantly delayed, your blood sugar could go low.  Please come back for a follow-up appointment in 4 months.   check your blood sugar twice a day.  vary the time of day when you check, between before the 3 meals, and at bedtime.  also check if you have symptoms of your blood sugar being too high or too low.  please keep a record of the readings and bring it to your next appointment here.  please call us sooner if your blood sugar goes below 70, or if you have a lot of readings over 200.

## 2019-09-07 NOTE — Progress Notes (Signed)
Subjective:    Patient ID: [name] [name], female    DOB: 23-Aug-1940, 79 y.o.   MRN: [id number]  HPI Pt returns for f/u of diabetes mellitus: DM type: Insulin-requiring type 2 Dx'ed: 4174 Complications: PN, CAD, renal insuff, and DR.   Therapy: insulin since soon after dx.  GDM: never.  DKA: never.  Severe hypoglycemia: last episode was in mid-2018.  Pancreatitis: never.  SDOH: She takes human insulin, due to cost.  Other: she is on BID premixed insulin, due to noncompliance with multiple daily injections.    Interval history: no cbg record, but states cbg's vary from 97-152.  There is no trend throughout the day.  pt states she feels well in general.  She seldom has hypoglycemia, and these episodes are mild.  Past Medical History:  Diagnosis Date  . Atrial fibrillation ([place])    SVT dx 2007, cath 2007 mild CAD, then had an cardioversion, ablation; still on coumadin , has occ palpitation, EKG 03-2010 NSR  . Blindness of left eye   . CKD (chronic kidney disease), stage III 06/16/2016   [name] 06/17/2016  . DIABETES MELLITUS, TYPE II 03/27/2006   dr [name]  . Eye muscle weakness    Right eye weakness after cataract surgery  . GERD (gastroesophageal reflux disease) 10/05/2011  . 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 HYPERLIPIDEMIA 03/27/2006  . HYPERTENSION 03/27/2006  . Intertrochanteric fracture of right hip ([place]) 07/13/2012  . 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 Osteopenia 2004   Dexa 2004 showed Osteopenia, DEXA 03/2007 normal  . Osteopenia   . Other chronic cystitis with hematuria   . Pelvic fracture ([place]) 06/16/2016   S/P fall  . Recurrent urinary tract infection    Seeing Urology  . RETINOPATHY, BACKGROUND NOS 03/27/2006  . Seizures ([place])   . 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[place]) 0/81/4481    Past Surgical History:  Procedure Laterality Date  . ANKLE FRACTURE SURGERY Bilateral 08/2015   "right one was in 2 places"  . CARDIAC  CATHETERIZATION N/A 05/17/2015   Procedure: Left Heart Cath and Coronary Angiography;  Surgeon: Jettie Booze, MD;  Location: [facility];  Service: Cardiovascular;  Laterality: N/A;  . CARDIAC CATHETERIZATION N/A 05/17/2015   Procedure: Coronary Balloon Angioplasty;  Surgeon: Jettie Booze, MD;  Location: [facility];  Service: Cardiovascular;  Laterality: N/A;  . FEMUR IM NAIL Right 07/15/2012   Procedure: INTRAMEDULLARY (IM) NAIL HIP;  Surgeon: Johnny Bridge, MD;  Location: [facility];  Service: Orthopedics;  Laterality: Right;  . FEMUR IM NAIL Left 12/02/2015   Procedure: INTRAMEDULLARY (IM) NAIL FEMORAL;  Surgeon: [name], MD;  Location: [facility];  Service: Orthopedics;  Laterality: Left;  . FRACTURE SURGERY    . INCISION AND DRAINAGE HIP Left 01/03/2016   Procedure: IRRIGATION AND DEBRIDEMENT HIP;  Surgeon: [name], MD;  Location: [facility];  Service: Orthopedics;  Laterality: Left;  . SHOULDER SURGERY Bilateral    "don't remember which side"    Social History   Socioeconomic History  . Marital status: Married    Spouse name: Jenny Reichmann  . Number of children: 1  . Years of education: College  . Highest education level: Not on file  Occupational History  . Occupation: retired     Fish farm manager: RETIRED  Tobacco Use  . Smoking status: Former Smoker    Types: Cigarettes    Quit  date: 03/02/1978    Years since quitting: 41.5  . Smokeless tobacco: Never Used  Substance and Sexual Activity  . Alcohol use: No    Alcohol/week: 0.0 standard drinks  . Drug use: No  . Sexual activity: Not Currently  Other Topics Concern  . Not on file  Social History Narrative   Patient lives at home spouse. Uses a walker consistently.   Moved from [place] 2004   1 son, problems w/ drugs, passed away 2016/03/16-- OD       Social Determinants of Health   Financial Resource Strain:   . Difficulty of Paying Living Expenses:   Food Insecurity:   . Worried About Charity fundraiser in the Last  Year:   . Arboriculturist in the Last Year:   Transportation Needs:   . Film/video editor (Medical):   [name] Lack of Transportation (Non-Medical):   Physical Activity:   . Days of Exercise per Week:   . Minutes of Exercise per Session:   Stress:   . Feeling of Stress :   Social Connections:   . Frequency of Communication with Friends and Family:   . Frequency of Social Gatherings with Friends and Family:   . Attends Religious Services:   . Active Member of Clubs or Organizations:   . Attends Archivist Meetings:   [name] Marital Status:   Intimate Partner Violence:   . Fear of Current or Ex-Partner:   . Emotionally Abused:   [name] Physically Abused:   . Sexually Abused:     Current Outpatient Medications on File Prior to Visit  Medication Sig Dispense Refill  . alendronate (FOSAMAX) 70 MG tablet Take 1 tablet (70 mg total) by mouth every 7 (seven) days. Take with a full glass of water on an empty stomach. 12 tablet 3  . aspirin EC 81 MG tablet Take 1 tablet (81 mg total) by mouth daily. 30 tablet 3  . atorvastatin (LIPITOR) 80 MG tablet Take 1 tablet (80 mg total) by mouth daily. 90 tablet 1  . augmented betamethasone dipropionate (DIPROLENE-AF) 0.05 % cream Apply topically 2 (two) times daily. 60 g 0  . calcium-vitamin D (OSCAL WITH D) 500-200 MG-UNIT per tablet Take 1 tablet by mouth daily.    . furosemide (LASIX) 40 MG tablet Take 1 tablet (40 mg total) by mouth 2 (two) times daily as needed for fluid. 180 tablet 1  . insulin NPH-regular Human (NOVOLIN 70/30 RELION) (70-30) 100 UNIT/ML injection 24 UNITS WITH BREAKFAST AND 4 UNITS WITH SUPPER 20 mL 0  . Multiple Vitamins-Minerals (MULTIVITAMIN ADULT PO) Take 1 tablet by mouth daily with breakfast.     . warfarin (COUMADIN) 2.5 MG tablet TAKE AS DIRECTED PER COUMADIN CLINIC 100 tablet 0   No current facility-administered medications on file prior to visit.    Allergies  Allergen Reactions  . Procaine Hcl Anaphylaxis  .  Amoxicillin Itching     Did it involve swelling of the face/tongue/throat, SOB, or low BP? No Did it involve sudden or severe rash/hives, skin peeling, or any reaction on the inside of your mouth or nose? No Did you need to seek medical attention at a hospital or doctor's office? No When did it last happen?unk If all above answers are "NO", may proceed with cephalosporin use.     Family History  Problem Relation Age of Onset  . Diabetes Father   . Heart attack Father 27  . Diabetes Sister   . Drug  abuse Son   . Cancer Neg Hx        no hx of colon or breast cancer    BP 110/68   Pulse 67   Ht 5\' 9"  (1.753 m)   Wt 177 lb 3.2 oz (80.4 kg)   SpO2 95%   BMI 26.17 kg/m    Review of Systems She denies LOC    Objective:   Physical Exam VITAL SIGNS:  See vs page GENERAL: no distress Pulses: dorsalis pedis intact bilat.   MSK: no deformity of the feet CV: no leg edema Skin:  no ulcer on the feet.  normal color and temp on the feet. Neuro: sensation is intact to touch on the feet.    Lab Results  Component Value Date   HGBA1C 8.0 (A) 09/07/2019       Assessment & Plan:  DM,with CRI, Worse Hypoglycemia, due to insulin: this limits aggressiveness of glycemic control, so we won't increase insulin now.    Patient Instructions  Please continue the same insulin.  On this type of insulin schedule, you should eat meals on a regular schedule.  If a meal is missed or significantly delayed, your blood sugar could go low.  Please come back for a follow-up appointment in 4 months.   check your blood sugar twice a day.  vary the time of day when you check, between before the 3 meals, and at bedtime.  also check if you have symptoms of your blood sugar being too high or too low.  please keep a record of the readings and bring it to your next appointment here.  please call us sooner if your blood sugar goes below 70, or if you have a lot of readings over 200.

## 2019-09-11 ENCOUNTER — Telehealth: Payer: Self-pay

## 2019-09-11 NOTE — Telephone Encounter (Signed)
Caller states she needs and appt. She has a lot of sinus congestion and pressure. she thinks it is a reaction to a new pet.  [name] [name]- Pt needs appt please, I believe Dr. Larose Kells is full today if someone else can see her.

## 2019-09-12 ENCOUNTER — Ambulatory Visit (INDEPENDENT_AMBULATORY_CARE_PROVIDER_SITE_OTHER): Payer: Medicare Other | Admitting: Family

## 2019-09-12 ENCOUNTER — Other Ambulatory Visit: Payer: Self-pay

## 2019-09-12 ENCOUNTER — Encounter: Payer: Self-pay | Admitting: Family

## 2019-09-12 VITALS — BP 128/76 | HR 88 | Temp 97.8°F | Resp 17 | Ht 69.0 in | Wt 177.0 lb

## 2019-09-12 DIAGNOSIS — J309 Allergic rhinitis, unspecified: Secondary | ICD-10-CM

## 2019-09-12 DIAGNOSIS — I255 Ischemic cardiomyopathy: Secondary | ICD-10-CM | POA: Diagnosis not present

## 2019-09-12 MED ORDER — CETIRIZINE HCL 10 MG PO TABS
10.0000 mg | ORAL_TABLET | Freq: Every day | ORAL | 11 refills | Status: DC
Start: 2019-09-12 — End: 2020-08-07

## 2019-09-12 MED ORDER — FLUTICASONE PROPIONATE 50 MCG/ACT NA SUSP
2.0000 | Freq: Every day | NASAL | 6 refills | Status: DC
Start: 2019-09-12 — End: 2020-08-07

## 2019-09-12 NOTE — Progress Notes (Signed)
Subjective:    Patient ID: [name] [name], female    DOB: June 24, 1940, 79 y.o.   MRN: [id number]  HPI  Patient is a 79 yr old female who presents today with chief complaint of nasal congestion. Congestion has been present x 2 months. Reports that she has been spending more time near her granddaughter's cat who lives in her home.   Reports that she is concerned that she may be allergic to the cat.  She reports that she is not taking any otc medications.  No fever, no sinus pain. Some sneezing, eyes irritation.    Review of Systems See HPI  Past Medical History:  Diagnosis Date  . Atrial fibrillation ([place])    SVT dx 2007, cath 2007 mild CAD, then had an cardioversion, ablation; still on coumadin , has occ palpitation, EKG 03-2010 NSR  . Blindness of left eye   . CKD (chronic kidney disease), stage III 06/16/2016   [name] 06/17/2016  . DIABETES MELLITUS, TYPE II 03/27/2006   dr [name]  . Eye muscle weakness    Right eye weakness after cataract surgery  . GERD (gastroesophageal reflux disease) 10/05/2011  . 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 HYPERLIPIDEMIA 03/27/2006  . HYPERTENSION 03/27/2006  . Intertrochanteric fracture of right hip ([place]) 07/13/2012  . 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 Osteopenia 2004   Dexa 2004 showed Osteopenia, DEXA 03/2007 normal  . Osteopenia   . Other chronic cystitis with hematuria   . Pelvic fracture ([place]) 06/16/2016   S/P fall  . Recurrent urinary tract infection    Seeing Urology  . RETINOPATHY, BACKGROUND NOS 03/27/2006  . Seizures ([place])   . 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[place]) 0/17/5102     Social History   Socioeconomic History  . Marital status: Married    Spouse name: Jenny Reichmann  . Number of children: 1  . Years of education: College  . Highest education level: Not on file  Occupational History  . Occupation: retired     Fish farm manager: RETIRED  Tobacco Use  . Smoking status: Former Smoker    Types:  Cigarettes    Quit date: 03/02/1978    Years since quitting: 41.5  . Smokeless tobacco: Never Used  Substance and Sexual Activity  . Alcohol use: No    Alcohol/week: 0.0 standard drinks  . Drug use: No  . Sexual activity: Not Currently  Other Topics Concern  . Not on file  Social History Narrative   Patient lives at home spouse. Uses a walker consistently.   Moved from [place] 2004   1 son, problems w/ drugs, passed away 02/22/16-- OD       Social Determinants of Health   Financial Resource Strain:   . Difficulty of Paying Living Expenses:   Food Insecurity:   . Worried About Charity fundraiser in the Last Year:   . Arboriculturist in the Last Year:   Transportation Needs:   . Film/video editor (Medical):   [name] Lack of Transportation (Non-Medical):   Physical Activity:   . Days of Exercise per Week:   . Minutes of Exercise per Session:   Stress:   . Feeling of Stress :   Social Connections:   . Frequency of Communication with Friends and Family:   . Frequency of Social Gatherings with Friends and Family:   . Attends Religious Services:   . Active Member of Clubs  or Organizations:   . Attends Archivist Meetings:   [name] Marital Status:   Intimate Partner Violence:   . Fear of Current or Ex-Partner:   . Emotionally Abused:   [name] Physically Abused:   . Sexually Abused:     Past Surgical History:  Procedure Laterality Date  . ANKLE FRACTURE SURGERY Bilateral 08/2015   "right one was in 2 places"  . CARDIAC CATHETERIZATION N/A 05/17/2015   Procedure: Left Heart Cath and Coronary Angiography;  Surgeon: Jettie Booze, MD;  Location: [facility];  Service: Cardiovascular;  Laterality: N/A;  . CARDIAC CATHETERIZATION N/A 05/17/2015   Procedure: Coronary Balloon Angioplasty;  Surgeon: Jettie Booze, MD;  Location: [facility];  Service: Cardiovascular;  Laterality: N/A;  . FEMUR IM NAIL Right 07/15/2012   Procedure: INTRAMEDULLARY (IM) NAIL HIP;   Surgeon: Johnny Bridge, MD;  Location: [facility];  Service: Orthopedics;  Laterality: Right;  . FEMUR IM NAIL Left 12/02/2015   Procedure: INTRAMEDULLARY (IM) NAIL FEMORAL;  Surgeon: [name], MD;  Location: [facility];  Service: Orthopedics;  Laterality: Left;  . FRACTURE SURGERY    . INCISION AND DRAINAGE HIP Left 01/03/2016   Procedure: IRRIGATION AND DEBRIDEMENT HIP;  Surgeon: [name], MD;  Location: [facility];  Service: Orthopedics;  Laterality: Left;  . SHOULDER SURGERY Bilateral    "don't remember which side"    Family History  Problem Relation Age of Onset  . Diabetes Father   . Heart attack Father 31  . Diabetes Sister   . Drug abuse Son   . Cancer Neg Hx        no hx of colon or breast cancer    Allergies  Allergen Reactions  . Procaine Hcl Anaphylaxis  . Amoxicillin Itching     Did it involve swelling of the face/tongue/throat, SOB, or low BP? No Did it involve sudden or severe rash/hives, skin peeling, or any reaction on the inside of your mouth or nose? No Did you need to seek medical attention at a hospital or doctor's office? No When did it last happen?unk If all above answers are "NO", may proceed with cephalosporin use.     Current Outpatient Medications on File Prior to Visit  Medication Sig Dispense Refill  . alendronate (FOSAMAX) 70 MG tablet Take 1 tablet (70 mg total) by mouth every 7 (seven) days. Take with a full glass of water on an empty stomach. 12 tablet 3  . aspirin EC 81 MG tablet Take 1 tablet (81 mg total) by mouth daily. 30 tablet 3  . atorvastatin (LIPITOR) 80 MG tablet Take 1 tablet (80 mg total) by mouth daily. 90 tablet 1  . augmented betamethasone dipropionate (DIPROLENE-AF) 0.05 % cream Apply topically 2 (two) times daily. 60 g 0  . calcium-vitamin D (OSCAL WITH D) 500-200 MG-UNIT per tablet Take 1 tablet by mouth daily.    . furosemide (LASIX) 40 MG tablet Take 1 tablet (40 mg total) by mouth 2 (two) times daily as needed for fluid. 180  tablet 1  . insulin NPH-regular Human (NOVOLIN 70/30 RELION) (70-30) 100 UNIT/ML injection 24 UNITS WITH BREAKFAST AND 4 UNITS WITH SUPPER 20 mL 0  . Multiple Vitamins-Minerals (MULTIVITAMIN ADULT PO) Take 1 tablet by mouth daily with breakfast.     . warfarin (COUMADIN) 2.5 MG tablet TAKE AS DIRECTED PER COUMADIN CLINIC 100 tablet 0   No current facility-administered medications on file prior to visit.  BP 128/76 (BP Location: Right Arm, Patient Position: Sitting, Cuff Size: Large)   Pulse 88   Temp 97.8 F (36.6 C) (Oral)   Resp 17   Ht 5\' 9"  (1.753 m)   Wt 177 lb (80.3 kg)   SpO2 98%   BMI 26.14 kg/m       Objective:   Physical Exam Constitutional:      Appearance: She is well-developed.  Cardiovascular:     Rate and Rhythm: Normal rate and regular rhythm.     Heart sounds: Normal heart sounds. No murmur heard.   Pulmonary:     Effort: Pulmonary effort is normal. No respiratory distress.     Breath sounds: Normal breath sounds. No wheezing.  Psychiatric:        Behavior: Behavior normal.        Thought Content: Thought content normal.        Judgment: Judgment normal.           Assessment & Plan:  Allergic rhinitis- recommended trial of zyrtec 10mg  once daily. If no improvement with zyrtec alone, could add flonase. We discussed referral to an allergist for formal testing for cat allergy,  but she declines at this time.  This visit occurred during the SARS-CoV-2 public health emergency.  Safety protocols were in place, including screening questions prior to the visit, additional usage of staff PPE, and extensive cleaning of exam room while observing appropriate contact time as indicated for disinfecting solutions.

## 2019-09-12 NOTE — Patient Instructions (Addendum)
Please add zyrtec and flonase once daily. Please call if symptoms worsen or if symptoms fail to improve.

## 2019-09-13 ENCOUNTER — Ambulatory Visit (INDEPENDENT_AMBULATORY_CARE_PROVIDER_SITE_OTHER): Payer: Medicare Other | Admitting: *Deleted

## 2019-09-13 ENCOUNTER — Encounter: Payer: Self-pay | Admitting: Internal Medicine

## 2019-09-13 DIAGNOSIS — Z5181 Encounter for therapeutic drug level monitoring: Secondary | ICD-10-CM

## 2019-09-13 DIAGNOSIS — I482 Chronic atrial fibrillation, unspecified: Secondary | ICD-10-CM

## 2019-09-13 DIAGNOSIS — I480 Paroxysmal atrial fibrillation: Secondary | ICD-10-CM | POA: Diagnosis not present

## 2019-09-13 LAB — POCT INR: INR: 2.3 (ref 2.0–3.0)

## 2019-09-13 NOTE — Patient Instructions (Signed)
Description   Continue taking 1 tablet daily. Recheck INR in 6 weeks.  Call us with any medication changes [phone] to [facility].

## 2019-09-18 ENCOUNTER — Other Ambulatory Visit: Payer: Self-pay | Admitting: Internal Medicine

## 2019-09-18 NOTE — Telephone Encounter (Signed)
Pt seen by Melissa on 09/12/19.

## 2019-10-25 ENCOUNTER — Ambulatory Visit (INDEPENDENT_AMBULATORY_CARE_PROVIDER_SITE_OTHER): Payer: Medicare Other | Admitting: Pharmacist

## 2019-10-25 ENCOUNTER — Other Ambulatory Visit: Payer: Self-pay

## 2019-10-25 DIAGNOSIS — Z5181 Encounter for therapeutic drug level monitoring: Secondary | ICD-10-CM | POA: Diagnosis not present

## 2019-10-25 DIAGNOSIS — I482 Chronic atrial fibrillation, unspecified: Secondary | ICD-10-CM

## 2019-10-25 DIAGNOSIS — I480 Paroxysmal atrial fibrillation: Secondary | ICD-10-CM

## 2019-10-25 LAB — POCT INR: INR: 2.4 (ref 2.0–3.0)

## 2019-10-25 NOTE — Patient Instructions (Signed)
Description   Continue taking 1 tablet daily. Recheck INR in 8 weeks.  Call us with any medication changes [phone] to [facility].

## 2019-12-05 ENCOUNTER — Other Ambulatory Visit: Payer: Self-pay

## 2019-12-05 ENCOUNTER — Encounter: Payer: Self-pay | Admitting: Internal Medicine

## 2019-12-05 ENCOUNTER — Ambulatory Visit (INDEPENDENT_AMBULATORY_CARE_PROVIDER_SITE_OTHER): Payer: Medicare Other | Admitting: Internal Medicine

## 2019-12-05 VITALS — BP 127/65 | HR 65 | Temp 97.5°F | Resp 18 | Ht 69.0 in | Wt 175.1 lb

## 2019-12-05 DIAGNOSIS — N189 Chronic kidney disease, unspecified: Secondary | ICD-10-CM | POA: Diagnosis not present

## 2019-12-05 DIAGNOSIS — N1832 Chronic kidney disease, stage 3b: Secondary | ICD-10-CM | POA: Diagnosis not present

## 2019-12-05 DIAGNOSIS — I255 Ischemic cardiomyopathy: Secondary | ICD-10-CM

## 2019-12-05 DIAGNOSIS — M810 Age-related osteoporosis without current pathological fracture: Secondary | ICD-10-CM

## 2019-12-05 NOTE — Progress Notes (Signed)
Pre visit review using our clinic review tool, if applicable. No additional management support is needed unless otherwise documented below in the visit note. 

## 2019-12-05 NOTE — Patient Instructions (Addendum)
Take flu shot at your earliest convenience  You will need a Covid booster soon  GO TO THE LAB : Get the blood work     [place], [place] back for   a checkup in 4 months

## 2019-12-05 NOTE — Progress Notes (Signed)
Subjective:    Patient ID: [name] [name], female    DOB: 03-Nov-1940, 79 y.o.   MRN: [id number]  DOS:  12/05/2019 Type of visit - description: Routine office visit In general she feels well. We review her medications, good compliance including Fosamax.. Available labs reviewed.   Review of Systems Denies chest pain, difficulty breathing. No lower extremity edema  Past Medical History:  Diagnosis Date  . Atrial fibrillation ([place])    SVT dx 2007, cath 2007 mild CAD, then had an cardioversion, ablation; still on coumadin , has occ palpitation, EKG 03-2010 NSR  . Blindness of left eye   . CKD (chronic kidney disease), stage III ([place]) 06/16/2016   [name] 06/17/2016  . DIABETES MELLITUS, TYPE II 03/27/2006   dr [name]  . Eye muscle weakness    Right eye weakness after cataract surgery  . GERD (gastroesophageal reflux disease) 10/05/2011  . 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 HYPERLIPIDEMIA 03/27/2006  . HYPERTENSION 03/27/2006  . Intertrochanteric fracture of right hip ([place]) 07/13/2012  . 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 Osteopenia 2004   Dexa 2004 showed Osteopenia, DEXA 03/2007 normal  . Osteopenia   . Other chronic cystitis with hematuria   . Pelvic fracture ([place]) 06/16/2016   S/P fall  . Recurrent urinary tract infection    Seeing Urology  . RETINOPATHY, BACKGROUND NOS 03/27/2006  . Seizures ([place])   . 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[place]) 07/10/2583    Past Surgical History:  Procedure Laterality Date  . ANKLE FRACTURE SURGERY Bilateral 08/2015   "right one was in 2 places"  . CARDIAC CATHETERIZATION N/A 05/17/2015   Procedure: Left Heart Cath and Coronary Angiography;  Surgeon: Jettie Booze, MD;  Location: [facility];  Service: Cardiovascular;  Laterality: N/A;  . CARDIAC CATHETERIZATION N/A 05/17/2015   Procedure: Coronary Balloon Angioplasty;  Surgeon: Jettie Booze, MD;  Location: [facility];   Service: Cardiovascular;  Laterality: N/A;  . FEMUR IM NAIL Right 07/15/2012   Procedure: INTRAMEDULLARY (IM) NAIL HIP;  Surgeon: Johnny Bridge, MD;  Location: [facility];  Service: Orthopedics;  Laterality: Right;  . FEMUR IM NAIL Left 12/02/2015   Procedure: INTRAMEDULLARY (IM) NAIL FEMORAL;  Surgeon: [name], MD;  Location: [facility];  Service: Orthopedics;  Laterality: Left;  . FRACTURE SURGERY    . INCISION AND DRAINAGE HIP Left 01/03/2016   Procedure: IRRIGATION AND DEBRIDEMENT HIP;  Surgeon: [name], MD;  Location: [facility];  Service: Orthopedics;  Laterality: Left;  . SHOULDER SURGERY Bilateral    "don't remember which side"    Allergies as of 12/05/2019      Reactions   Procaine Hcl Anaphylaxis   Amoxicillin Itching   Did it involve swelling of the face/tongue/throat, SOB, or low BP? No Did it involve sudden or severe rash/hives, skin peeling, or any reaction on the inside of your mouth or nose? No Did you need to seek medical attention at a hospital or doctor's office? No When did it last happen?unk If all above answers are "NO", may proceed with cephalosporin use.      Medication List       Accurate as of December 05, 2019 11:59 PM. If you have any questions, ask your nurse or doctor.        alendronate 70 MG tablet Commonly known as: FOSAMAX Take 1 tablet (70 mg total) by  mouth every 7 (seven) days. Take with a full glass of water on an empty stomach.   aspirin EC 81 MG tablet Take 1 tablet (81 mg total) by mouth daily.   atorvastatin 80 MG tablet Commonly known as: LIPITOR Take 1 tablet (80 mg total) by mouth daily.   augmented betamethasone dipropionate 0.05 % cream Commonly known as: DIPROLENE-AF Apply topically 2 (two) times daily.   calcium-vitamin D 500-200 MG-UNIT tablet Commonly known as: OSCAL WITH D Take 1 tablet by mouth daily.   cetirizine 10 MG tablet Commonly known as: ZYRTEC Take 1 tablet (10 mg total) by mouth daily.   fluticasone 50 MCG/ACT  nasal spray Commonly known as: FLONASE Place 2 sprays into both nostrils daily.   furosemide 40 MG tablet Commonly known as: LASIX Take 1 tablet (40 mg total) by mouth 2 (two) times daily as needed for fluid.   MULTIVITAMIN ADULT PO Take 1 tablet by mouth daily with breakfast.   NovoLIN 70/30 ReliOn (70-30) 100 UNIT/ML injection Generic drug: insulin NPH-regular Human 24 UNITS WITH BREAKFAST AND 4 UNITS WITH SUPPER   warfarin 2.5 MG tablet Commonly known as: COUMADIN Take as directed by the anticoagulation clinic. If you are unsure how to take this medication, talk to your nurse or doctor. Original instructions: TAKE AS DIRECTED PER COUMADIN CLINIC          Objective:   Physical Exam BP 127/65 (BP Location: Left Arm, Patient Position: Sitting, Cuff Size: Small)   Pulse 65   Temp (!) 97.5 F (36.4 C) (Oral)   Resp 18   Ht 5\' 9"  (1.753 m)   Wt 175 lb 2 oz (79.4 kg)   SpO2 98%   BMI 25.86 kg/m  General:   Well developed, NAD, BMI noted. HEENT:  Normocephalic . Face symmetric, atraumatic Lungs:  CTA B Normal respiratory effort, no intercostal retractions, no accessory muscle use. Heart: Irregularly irregular,  no murmur.  Lower extremities: no pretibial edema bilaterally  Skin: Not pale. Not jaundice Neurologic:  alert & oriented X3.  Speech normal, gait appropriate for age and unassisted Psych--  Cognition and judgment appear intact.  Cooperative with normal attention span and concentration.  Behavior appropriate. No anxious or depressed appearing.      Assessment      Assessment   DM  + retinopathy, Dr [name] HTN CKD:  -Creatinine 1.5 = GFR ~35 -Elevated creatinine(started after admission 05-2015, had IV contrast) Hyperlipidemia anxiety ativan GERD Anemia, normal iron 05-2015, never had a colonoscopy CV ---NSTEMI 05-2015, cath, angiplasty ---Echo, EF 40 % (05-2015) ---Atrial fibrillation, SVT -- dx 2007, cath 2007 mild CAD. 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  Recommend good hydration, avoid NSAIDs.  Recheck a BMP. We also talk about possible nephrology referral, she is not interested at this point. Osteoporosis: Continue fosamax  for a total of 5 years.  No apparent side effects. Preventive care: Plans to get a flu shot soon Had a [employer] vaccine for [facility] April 2021, hopefully she will be soon eligible for a booster. RTC 4 months   This visit occurred during the SARS-CoV-2 public health emergency.  Safety protocols were in place, including screening questions prior to the visit, additional usage of staff PPE, and extensive cleaning of exam room while observing appropriate contact time as indicated for disinfecting solutions.

## 2019-12-06 LAB — BASIC METABOLIC PANEL
BUN/Creatinine Ratio: 13 (calc) (ref 6–22)
BUN: 22 mg/dL (ref 7–25)
CO2: 30 mmol/L (ref 20–32)
Calcium: 9.3 mg/dL (ref 8.6–10.4)
Chloride: 102 mmol/L (ref 98–110)
Creat: 1.65 mg/dL — ABNORMAL HIGH (ref 0.60–0.93)
Glucose, Bld: 179 mg/dL — ABNORMAL HIGH (ref 65–99)
Potassium: 4.7 mmol/L (ref 3.5–5.3)
Sodium: 141 mmol/L (ref 135–146)

## 2019-12-06 NOTE — Assessment & Plan Note (Signed)
HTN: Currently on Lasix, BP today is very good CKD: Last creatinine 1.5  ====>  GFR of 35.  Recommend good hydration, avoid NSAIDs.  Recheck a BMP. We also talk about possible nephrology referral, she is not interested at this point. Osteoporosis: Continue fosamax  for a total of 5 years.  No apparent side effects. Preventive care: Plans to get a flu shot soon Had a [employer] vaccine for [facility] April 2021, hopefully she will be soon eligible for a booster. RTC 4 months

## 2019-12-18 ENCOUNTER — Ambulatory Visit (INDEPENDENT_AMBULATORY_CARE_PROVIDER_SITE_OTHER): Payer: Medicare Other

## 2019-12-18 DIAGNOSIS — Z23 Encounter for immunization: Secondary | ICD-10-CM | POA: Diagnosis not present

## 2019-12-21 ENCOUNTER — Other Ambulatory Visit: Payer: Self-pay | Admitting: Internal Medicine

## 2019-12-25 ENCOUNTER — Ambulatory Visit (INDEPENDENT_AMBULATORY_CARE_PROVIDER_SITE_OTHER): Payer: Medicare Other | Admitting: *Deleted

## 2019-12-25 ENCOUNTER — Other Ambulatory Visit: Payer: Self-pay

## 2019-12-25 DIAGNOSIS — I482 Chronic atrial fibrillation, unspecified: Secondary | ICD-10-CM | POA: Diagnosis not present

## 2019-12-25 DIAGNOSIS — I480 Paroxysmal atrial fibrillation: Secondary | ICD-10-CM

## 2019-12-25 DIAGNOSIS — Z5181 Encounter for therapeutic drug level monitoring: Secondary | ICD-10-CM | POA: Diagnosis not present

## 2019-12-25 LAB — POCT INR: INR: 4.3 — AB (ref 2.0–3.0)

## 2019-12-25 NOTE — Patient Instructions (Signed)
Description   Hold warfarin today and tomorrow, then continue taking 1 tablet daily. Recheck INR in 2 weeks.  Call us with any medication changes [phone] to Coumadin Clinic.

## 2019-12-28 ENCOUNTER — Ambulatory Visit: Payer: BLUE CROSS/BLUE SHIELD | Admitting: Cardiovascular Disease

## 2020-01-01 ENCOUNTER — Ambulatory Visit: Payer: BLUE CROSS/BLUE SHIELD | Admitting: Cardiovascular Disease

## 2020-01-10 ENCOUNTER — Ambulatory Visit (INDEPENDENT_AMBULATORY_CARE_PROVIDER_SITE_OTHER): Payer: Medicare Other | Admitting: Endocrinology

## 2020-01-10 ENCOUNTER — Other Ambulatory Visit: Payer: Self-pay

## 2020-01-10 ENCOUNTER — Encounter: Payer: Self-pay | Admitting: Endocrinology

## 2020-01-10 VITALS — BP 116/86 | HR 66 | Wt 174.0 lb

## 2020-01-10 DIAGNOSIS — Z794 Long term (current) use of insulin: Secondary | ICD-10-CM | POA: Diagnosis not present

## 2020-01-10 DIAGNOSIS — E1159 Type 2 diabetes mellitus with other circulatory complications: Secondary | ICD-10-CM

## 2020-01-10 DIAGNOSIS — I255 Ischemic cardiomyopathy: Secondary | ICD-10-CM | POA: Diagnosis not present

## 2020-01-10 DIAGNOSIS — N1832 Chronic kidney disease, stage 3b: Secondary | ICD-10-CM | POA: Diagnosis not present

## 2020-01-10 DIAGNOSIS — E1122 Type 2 diabetes mellitus with diabetic chronic kidney disease: Secondary | ICD-10-CM | POA: Diagnosis not present

## 2020-01-10 LAB — POCT GLYCOSYLATED HEMOGLOBIN (HGB A1C): Hemoglobin A1C: 8.2 % — AB (ref 4.0–5.6)

## 2020-01-10 MED ORDER — NOVOLIN 70/30 RELION (70-30) 100 UNIT/ML ~~LOC~~ SUSP
SUBCUTANEOUS | 3 refills | Status: DC
Start: 2020-01-10 — End: 2020-06-12

## 2020-01-10 NOTE — Progress Notes (Signed)
Subjective:    Patient ID: [name] [name], female    DOB: October 14, 1940, 79 y.o.   MRN: [id number]  HPI Pt returns for f/u of diabetes mellitus: DM type: Insulin-requiring type 2 Dx'ed: 2992 Complications: PN, CAD, stage 3b CRI, and DR.   Therapy: insulin since soon after dx.  GDM: never.  DKA: never.  Severe hypoglycemia: last episode was in mid-2018.  Pancreatitis: never.  SDOH: She takes human insulin, due to cost.  Other: she is on BID premixed insulin, due to noncompliance with multiple daily injections.  Interval history: no cbg record, but states cbg's vary from 90-168.  There is no trend throughout the day.  pt states she feels well in general.  She takes insulin 20 units QAM and 4 units QHS Past Medical History:  Diagnosis Date  . Atrial fibrillation ([place])    SVT dx 2007, cath 2007 mild CAD, then had an cardioversion, ablation; still on coumadin , has occ palpitation, EKG 03-2010 NSR  . Blindness of left eye   . CKD (chronic kidney disease), stage III ([place]) 06/16/2016   [name] 06/17/2016  . DIABETES MELLITUS, TYPE II 03/27/2006   dr [name]  . Eye muscle weakness    Right eye weakness after cataract surgery  . GERD (gastroesophageal reflux disease) 10/05/2011  . 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 HYPERLIPIDEMIA 03/27/2006  . HYPERTENSION 03/27/2006  . Intertrochanteric fracture of right hip ([place]) 07/13/2012  . 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 Osteopenia 2004   Dexa 2004 showed Osteopenia, DEXA 03/2007 normal  . Osteopenia   . Other chronic cystitis with hematuria   . Pelvic fracture ([place]) 06/16/2016   S/P fall  . Recurrent urinary tract infection    Seeing Urology  . RETINOPATHY, BACKGROUND NOS 03/27/2006  . Seizures ([place])   . 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[place]) 06/26/8339    Past Surgical History:  Procedure Laterality Date  . ANKLE FRACTURE SURGERY Bilateral 08/2015   "right one was in 2 places"  . CARDIAC  CATHETERIZATION N/A 05/17/2015   Procedure: Left Heart Cath and Coronary Angiography;  Surgeon: Jettie Booze, MD;  Location: [facility];  Service: Cardiovascular;  Laterality: N/A;  . CARDIAC CATHETERIZATION N/A 05/17/2015   Procedure: Coronary Balloon Angioplasty;  Surgeon: Jettie Booze, MD;  Location: [facility];  Service: Cardiovascular;  Laterality: N/A;  . FEMUR IM NAIL Right 07/15/2012   Procedure: INTRAMEDULLARY (IM) NAIL HIP;  Surgeon: Johnny Bridge, MD;  Location: [facility];  Service: Orthopedics;  Laterality: Right;  . FEMUR IM NAIL Left 12/02/2015   Procedure: INTRAMEDULLARY (IM) NAIL FEMORAL;  Surgeon: [name], MD;  Location: [facility];  Service: Orthopedics;  Laterality: Left;  . FRACTURE SURGERY    . INCISION AND DRAINAGE HIP Left 01/03/2016   Procedure: IRRIGATION AND DEBRIDEMENT HIP;  Surgeon: [name], MD;  Location: [facility];  Service: Orthopedics;  Laterality: Left;  . SHOULDER SURGERY Bilateral    "don't remember which side"    Social History   Socioeconomic History  . Marital status: Married    Spouse name: Jenny Reichmann  . Number of children: 1  . Years of education: College  . Highest education level: Not on file  Occupational History  . Occupation: retired     Fish farm manager: RETIRED  Tobacco Use  . Smoking status: Former Smoker    Types: Cigarettes    Quit  date: 03/02/1978    Years since quitting: 41.8  . Smokeless tobacco: Never Used  Substance and Sexual Activity  . Alcohol use: No    Alcohol/week: 0.0 standard drinks  . Drug use: No  . Sexual activity: Not Currently  Other Topics Concern  . Not on file  Social History Narrative   Patient lives at home spouse. Uses a walker consistently.   Moved from [place] 2004   1 son, problems w/ drugs, passed away 02/27/16-- OD       Social Determinants of Health   Financial Resource Strain:   . Difficulty of Paying Living Expenses: Not on file  Food Insecurity:   . Worried About Charity fundraiser  in the Last Year: Not on file  . Ran Out of Food in the Last Year: Not on file  Transportation Needs:   . Lack of Transportation (Medical): Not on file  . Lack of Transportation (Non-Medical): Not on file  Physical Activity:   . Days of Exercise per Week: Not on file  . Minutes of Exercise per Session: Not on file  Stress:   . Feeling of Stress : Not on file  Social Connections:   . Frequency of Communication with Friends and Family: Not on file  . Frequency of Social Gatherings with Friends and Family: Not on file  . Attends Religious Services: Not on file  . Active Member of Clubs or Organizations: Not on file  . Attends Archivist Meetings: Not on file  . Marital Status: Not on file  Intimate Partner Violence:   . Fear of Current or Ex-Partner: Not on file  . Emotionally Abused: Not on file  . Physically Abused: Not on file  . Sexually Abused: Not on file    Current Outpatient Medications on File Prior to Visit  Medication Sig Dispense Refill  . alendronate (FOSAMAX) 70 MG tablet Take 1 tablet (70 mg total) by mouth every 7 (seven) days. Take with a full glass of water on an empty stomach. 12 tablet 3  . aspirin EC 81 MG tablet Take 1 tablet (81 mg total) by mouth daily. 30 tablet 3  . atorvastatin (LIPITOR) 80 MG tablet Take 1 tablet by mouth once daily 90 tablet 0  . augmented betamethasone dipropionate (DIPROLENE-AF) 0.05 % cream Apply topically 2 (two) times daily. 60 g 0  . calcium-vitamin D (OSCAL WITH D) 500-200 MG-UNIT per tablet Take 1 tablet by mouth daily.    . cetirizine (ZYRTEC) 10 MG tablet Take 1 tablet (10 mg total) by mouth daily. 30 tablet 11  . fluticasone (FLONASE) 50 MCG/ACT nasal spray Place 2 sprays into both nostrils daily. 16 g 6  . furosemide (LASIX) 40 MG tablet Take 1 tablet (40 mg total) by mouth 2 (two) times daily as needed for fluid. 180 tablet 1  . Multiple Vitamins-Minerals (MULTIVITAMIN ADULT PO) Take 1 tablet by mouth daily with  breakfast.     . warfarin (COUMADIN) 2.5 MG tablet TAKE AS DIRECTED PER COUMADIN CLINIC 100 tablet 1   No current facility-administered medications on file prior to visit.    Allergies  Allergen Reactions  . Procaine Hcl Anaphylaxis  . Amoxicillin Itching     Did it involve swelling of the face/tongue/throat, SOB, or low BP? No Did it involve sudden or severe rash/hives, skin peeling, or any reaction on the inside of your mouth or nose? No Did you need to seek medical attention at a hospital or doctor's office? No  When did it last happen?unk If all above answers are "NO", may proceed with cephalosporin use.     Family History  Problem Relation Age of Onset  . Diabetes Father   . Heart attack Father 82  . Diabetes Sister   . Drug abuse Son   . Cancer Neg Hx        no hx of colon or breast cancer    BP 116/86   Pulse 66   Wt 174 lb (78.9 kg)   SpO2 97%   BMI 25.70 kg/m    Review of Systems She denies hypoglycemia    Objective:   Physical Exam VITAL SIGNS:  See vs page GENERAL: no distress Pulses: dorsalis pedis intact bilat.   MSK: no deformity of the feet CV: no leg edema Skin:  no ulcer on the feet.  normal color and temp on the feet. Neuro: sensation is intact to touch on the feet  Lab Results  Component Value Date   HGBA1C 8.2 (A) 01/10/2020    Lab Results  Component Value Date   CREATININE 1.65 (H) 12/05/2019   BUN 22 12/05/2019   NA 141 12/05/2019   K 4.7 12/05/2019   CL 102 12/05/2019   CO2 30 12/05/2019       Assessment & Plan:  Insulin-requiring type 2 DM, with stage 3b CRI: uncontrolled.   Patient Instructions  Please change the insulin to 21 units with breakfast and 5 units with the evening meal (rather than at bedtime) On this type of insulin schedule, you should eat meals on a regular schedule.  If a meal is missed or significantly delayed, your blood sugar could go low.  Please come back for a follow-up appointment in 3 months.    check your blood sugar twice a day.  vary the time of day when you check, between before the 3 meals, and at bedtime.  also check if you have symptoms of your blood sugar being too high or too low.  please keep a record of the readings and bring it to your next appointment here.  please call us sooner if your blood sugar goes below 70, or if you have a lot of readings over 200.

## 2020-01-10 NOTE — Patient Instructions (Addendum)
Please change the insulin to 21 units with breakfast and 5 units with the evening meal (rather than at bedtime) On this type of insulin schedule, you should eat meals on a regular schedule.  If a meal is missed or significantly delayed, your blood sugar could go low.  Please come back for a follow-up appointment in 3 months.   check your blood sugar twice a day.  vary the time of day when you check, between before the 3 meals, and at bedtime.  also check if you have symptoms of your blood sugar being too high or too low.  please keep a record of the readings and bring it to your next appointment here.  please call us sooner if your blood sugar goes below 70, or if you have a lot of readings over 200.

## 2020-01-12 ENCOUNTER — Other Ambulatory Visit: Payer: Self-pay

## 2020-01-12 ENCOUNTER — Ambulatory Visit (INDEPENDENT_AMBULATORY_CARE_PROVIDER_SITE_OTHER): Payer: Medicare Other | Admitting: *Deleted

## 2020-01-12 ENCOUNTER — Encounter: Payer: Self-pay | Admitting: Internal Medicine

## 2020-01-12 DIAGNOSIS — I482 Chronic atrial fibrillation, unspecified: Secondary | ICD-10-CM

## 2020-01-12 DIAGNOSIS — I480 Paroxysmal atrial fibrillation: Secondary | ICD-10-CM | POA: Diagnosis not present

## 2020-01-12 DIAGNOSIS — Z5181 Encounter for therapeutic drug level monitoring: Secondary | ICD-10-CM

## 2020-01-12 LAB — POCT INR: INR: 3.5 — AB (ref 2.0–3.0)

## 2020-01-12 NOTE — Patient Instructions (Signed)
Description   Hold warfarin today and then start taking 1 tablet daily except for 1/2 a tablet on Mondays. Recheck INR in 2 weeks.  Call us with any medication changes [phone] to Coumadin Clinic.

## 2020-01-18 ENCOUNTER — Other Ambulatory Visit: Payer: Self-pay | Admitting: Internal Medicine

## 2020-01-23 ENCOUNTER — Ambulatory Visit: Payer: BLUE CROSS/BLUE SHIELD | Admitting: Cardiovascular Disease

## 2020-01-23 NOTE — Progress Notes (Deleted)
Cardiology Office Note   Date:  01/23/2020   ID:  [name] [name], [name] [name] 16-Sep-1940, MRN [id number]  PCP:  Colon Branch, MD  Cardiologist:   Skeet Latch, MD   No chief complaint on file.    History of Present Illness: [name] [name] is a 79 y.o. 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  At her last appointment she was noted to be bradycardic into the 40s. Metoprolol was reduced to 50 mg twice a day.  Her lisinopril was increased to 5 mg daily.  Since she was last seen [name] [name] has been doing well.  She denies any chest pain or shortness of breath.  Her main complaint has been fatigue.  She doesn't have the energy to do anything around the house.  She also notes muliple episodes of nausea and emesis.  She has been throwing up for the last month.  The last time this occurred she had a UTI.  She denies any fever, chills, dysuria or foul-smelling urine.  She also has not noticed any lower extremity edema, orthopnea or PND. Since her last appointment clopidogrel was discontinued. Past Medical History:  Diagnosis Date  . Atrial fibrillation ([place])    SVT dx 2007, cath 2007 mild CAD, then had an cardioversion, ablation; still on coumadin , has occ palpitation, EKG 03-2010 NSR  . Blindness of left eye   . CKD (chronic kidney disease), stage III ([place]) 06/16/2016   [name] 06/17/2016  . DIABETES MELLITUS, TYPE II 03/27/2006   dr [name]  . Eye muscle weakness    Right eye weakness after cataract surgery  . GERD (gastroesophageal reflux disease) 10/05/2011  . 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 HYPERLIPIDEMIA 03/27/2006  . HYPERTENSION 03/27/2006  . Intertrochanteric fracture of right hip ([place]) 07/13/2012  . 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  Osteopenia 2004   Dexa 2004 showed Osteopenia, DEXA 03/2007 normal  . Osteopenia   . Other chronic cystitis with hematuria   . Pelvic fracture ([place]) 06/16/2016   S/P fall  . Recurrent urinary tract infection    Seeing Urology  . RETINOPATHY, BACKGROUND NOS 03/27/2006  . Seizures ([place])   . 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[place]) 1/32/4401    Past Surgical History:  Procedure Laterality Date  . ANKLE FRACTURE SURGERY Bilateral 08/2015   "right one was in 2 places"  . CARDIAC CATHETERIZATION N/A 05/17/2015   Procedure: Left Heart Cath and Coronary Angiography;  Surgeon: Jettie Booze, MD;  Location: [facility];  Service: Cardiovascular;  Laterality: N/A;  . CARDIAC CATHETERIZATION N/A 05/17/2015   Procedure: Coronary Balloon Angioplasty;  Surgeon: Jettie Booze, MD;  Location: [facility];  Service: Cardiovascular;  Laterality: N/A;  . FEMUR IM NAIL Right 07/15/2012   Procedure: INTRAMEDULLARY (IM) NAIL HIP;  Surgeon: Johnny Bridge, MD;  Location: [facility];  Service: Orthopedics;  Laterality: Right;  . FEMUR IM NAIL Left 12/02/2015   Procedure: INTRAMEDULLARY (IM) NAIL FEMORAL;  Surgeon: [name], MD;  Location: [facility];  Service: Orthopedics;  Laterality: Left;  . FRACTURE SURGERY    . INCISION AND DRAINAGE HIP Left 01/03/2016   Procedure: IRRIGATION AND DEBRIDEMENT HIP;  Surgeon: [name], MD;  Location: [facility];  Service: Orthopedics;  Laterality: Left;  . SHOULDER SURGERY Bilateral    "don't remember which side"     Current Outpatient Medications  Medication Sig Dispense Refill  . alendronate (FOSAMAX) 70 MG tablet Take 1 tablet (70 mg total) by mouth every 7 (seven) days. Take with a full glass of water on an empty stomach. 12 tablet 3  . aspirin EC 81 MG tablet Take 1 tablet (81 mg total) by mouth daily. 30 tablet 3  . atorvastatin (LIPITOR) 80 MG tablet Take 1 tablet by mouth once daily 90 tablet 0  . augmented betamethasone dipropionate (DIPROLENE-AF) 0.05 % cream Apply  topically 2 (two) times daily. 60 g 0  . calcium-vitamin D (OSCAL WITH D) 500-200 MG-UNIT per tablet Take 1 tablet by mouth daily.    . cetirizine (ZYRTEC) 10 MG tablet Take 1 tablet (10 mg total) by mouth daily. 30 tablet 11  . fluticasone (FLONASE) 50 MCG/ACT nasal spray Place 2 sprays into both nostrils daily. 16 g 6  . furosemide (LASIX) 40 MG tablet Take 1 tablet (40 mg total) by mouth 2 (two) times daily as needed for fluid. 180 tablet 1  . insulin NPH-regular Human (NOVOLIN 70/30 RELION) (70-30) 100 UNIT/ML injection 21 units with breakfast and 5 units with the evening meal. 20 mL 3  . Multiple Vitamins-Minerals (MULTIVITAMIN ADULT PO) Take 1 tablet by mouth daily with breakfast.     . warfarin (COUMADIN) 2.5 MG tablet TAKE AS DIRECTED PER COUMADIN CLINIC 100 tablet 1   No current facility-administered medications for this visit.    Allergies:   Procaine hcl and Amoxicillin    Social History:  The patient  reports that she quit smoking about 41 years ago. Her smoking use included cigarettes. She has never used smokeless tobacco. She reports that she does not drink alcohol and does not use drugs.   Family History:  The patient's  family history includes Diabetes in her father and sister; Drug abuse in her son; Heart attack (age of onset: 62) in her father.    ROS:  Please see the history of present illness.   Otherwise, review of systems are positive for none.   All other systems are reviewed and negative.    PHYSICAL EXAM: VS:  There were no vitals taken for this visit. , BMI There is no height or weight on file to calculate BMI. GENERAL:  Well appearing HEENT:  Pupils equal round and reactive, fundi not visualized, oral mucosa unremarkable NECK:  No jugular venous distention, waveform within normal limits, carotid upstroke brisk and symmetric, no bruits LYMPHATICS:  No cervical adenopathy LUNGS:  Clear to auscultation bilaterally HEART:  RRR.  PMI not displaced or sustained,S1  and S2 within normal limits, no S3, no S4, no clicks, no rubs, no murmurs ABD:  Flat, positive bowel sounds normal in frequency in pitch, no bruits, no rebound, no guarding, no midline pulsatile mass, no hepatomegaly, no splenomegaly EXT:  2 plus pulses throughout, no edema, no cyanosis no clubbing SKIN:  No rashes no nodules NEURO:  Cranial nerves II through XII grossly intact, motor grossly intact throughout PSYCH:  Cognitively intact, oriented to person place and time  EKG:  EKG is ordered today. The ekg ordered today demonstrates sinus Rhythm. Rate 74 bpm. Lateral T-wave inversions. Unchanged from prior, no bradycardia has resolved  Echo 05/10/15: Study Conclusions  - Left ventricle: The cavity size was normal. There was moderate  concentric hypertrophy. Systolic function was moderately reduced.  The estimated ejection fraction  was in the range of 35% to 40%.  Diffuse hypokinesis. The study was not technically sufficient to  allow evaluation of LV diastolic dysfunction due to atrial  fibrillation. - Aortic valve: Trileaflet; normal thickness leaflets. There was no  regurgitation. - Aortic root: The aortic root was normal in size. - Mitral valve: Mildly thickened leaflets . There was mild  regurgitation. - Left atrium: The atrium was moderately dilated. - Right ventricle: The cavity size was normal. Wall thickness was  normal. Systolic function was normal. - Right atrium: The atrium was normal in size. - Tricuspid valve: There was moderate regurgitation. - Pulmonic valve: There was no regurgitation. - Pulmonary arteries: Systolic pressure was mildly to moderately  increased. PA peak pressure: 45 mm Hg (S). - Inferior vena cava: The vessel was normal in size. - Pericardium, extracardiac: There was no pericardial effusion.  LHC 05/17/15:  There is moderate left ventricular systolic dysfunction.  1st Mrg lesion, 100% stenosed. Intervention was performed with balloon  angioplasty. No stent was placed.  At the end of the procedure, the initial occlusion site had no residual stenosis. There did remain a large branch downstream of the initial occlusion which remained occluded and did not respond to angioplasty. There is a smaller branch which had TIMI-3 flow restored. The patient was pain-free at the end of the procedure.  Moderate LV dysfunction. Moderately elevated LVEDP.  Recent Labs: 08/04/2019: ALT 15; Hemoglobin 12.3; Platelets 265.0 12/05/2019: BUN 22; Creat 1.65; Potassium 4.7; Sodium 141    Lipid Panel    Component Value Date/Time   CHOL 131 01/04/2019 0953   TRIG 116.0 01/04/2019 0953   HDL 44.30 01/04/2019 0953   CHOLHDL 3 01/04/2019 0953   VLDL 23.2 01/04/2019 0953   LDLCALC 63 01/04/2019 0953      Wt Readings from Last 3 Encounters:  01/10/20 174 lb (78.9 kg)  12/05/19 175 lb 2 oz (79.4 kg)  09/12/19 177 lb (80.3 kg)      ASSESSMENT AND PLAN:  # CAD s/p NSTEMI: [name] [name] is doing well and denies recurrent chest pain.  Continue aspirin, Plavix, metoprolol and atorvastatin.  Stop aspirin and continue Plavix and coumadin only.   # Atrial fibrillation: She is currently in sinus rhythm and has not noted any recurrent palpitations.  Her heart rate has improved with the reduction in her metoprolol. Continue warfarin. This patients CHA2DS2-VASc Score and unadjusted Ischemic Stroke Rate (% per year) is equal to 9.7 % stroke rate/year from a score of 6  Above score calculated as 1 point each if present [CHF, HTN, DM, Vascular=MI/PAD/Aortic Plaque, Age if 65-74, or Female] Above score calculated as 2 points each if present [Age > 75, or Stroke/TIA/TE]  # Chronic systolic and diastolic heart failure: She appears to be euvolemic on exam and is doing well.  Continue lisinopril, lasix and metoprolol as above.    # Hypertensive heart disease: Blood pressure is well-controlled today.  Continue metoprolol and lisinopril.  # Nausea: [name] [name]  reports nausea and emesis lately. The last time this happened she had a urinary tract infection. We will check a urinalysis and a CBC today. If this is within normal limits and he recommended that she follow-up with a gastroenterologist.  Current medicines are reviewed at length with the patient today.  The patient does not have concerns regarding medicines.  The following changes have been made:  Stop aspirin Labs/ tests ordered today include:   No orders of the defined types were placed in  this encounter.    Disposition:   FU with Draeden Kellman C. Oval Linsey, MD, [facility] in 6 months.    This note was written with the assistance of speech recognition software.  Please excuse any transcriptional errors.  Signed, Kyan Giannone C. Oval Linsey, MD, [facility]  01/23/2020 9:18 AM    [place]

## 2020-01-26 ENCOUNTER — Emergency Department (HOSPITAL_BASED_OUTPATIENT_CLINIC_OR_DEPARTMENT_OTHER): Payer: Medicare Other

## 2020-01-26 ENCOUNTER — Encounter (HOSPITAL_BASED_OUTPATIENT_CLINIC_OR_DEPARTMENT_OTHER): Payer: Self-pay

## 2020-01-26 ENCOUNTER — Emergency Department (HOSPITAL_BASED_OUTPATIENT_CLINIC_OR_DEPARTMENT_OTHER)
Admission: EM | Admit: 2020-01-26 | Discharge: 2020-01-26 | Disposition: A | Discharge status: Home / Self Care | Payer: Medicare Other | Attending: Emergency Medicine | Admitting: Emergency Medicine

## 2020-01-26 ENCOUNTER — Other Ambulatory Visit: Payer: Self-pay

## 2020-01-26 DIAGNOSIS — Z955 Presence of coronary angioplasty implant and graft: Secondary | ICD-10-CM | POA: Diagnosis not present

## 2020-01-26 DIAGNOSIS — I5021 Acute systolic (congestive) heart failure: Secondary | ICD-10-CM | POA: Diagnosis not present

## 2020-01-26 DIAGNOSIS — Z7901 Long term (current) use of anticoagulants: Secondary | ICD-10-CM | POA: Diagnosis not present

## 2020-01-26 DIAGNOSIS — R519 Headache, unspecified: Secondary | ICD-10-CM | POA: Diagnosis not present

## 2020-01-26 DIAGNOSIS — R112 Nausea with vomiting, unspecified: Secondary | ICD-10-CM

## 2020-01-26 DIAGNOSIS — I130 Hypertensive heart and chronic kidney disease with heart failure and stage 1 through stage 4 chronic kidney disease, or unspecified chronic kidney disease: Secondary | ICD-10-CM | POA: Insufficient documentation

## 2020-01-26 DIAGNOSIS — Z87891 Personal history of nicotine dependence: Secondary | ICD-10-CM | POA: Insufficient documentation

## 2020-01-26 DIAGNOSIS — I2510 Atherosclerotic heart disease of native coronary artery without angina pectoris: Secondary | ICD-10-CM | POA: Insufficient documentation

## 2020-01-26 DIAGNOSIS — N3000 Acute cystitis without hematuria: Secondary | ICD-10-CM | POA: Insufficient documentation

## 2020-01-26 DIAGNOSIS — Z794 Long term (current) use of insulin: Secondary | ICD-10-CM | POA: Insufficient documentation

## 2020-01-26 DIAGNOSIS — E119 Type 2 diabetes mellitus without complications: Secondary | ICD-10-CM | POA: Insufficient documentation

## 2020-01-26 DIAGNOSIS — N183 Chronic kidney disease, stage 3 unspecified: Secondary | ICD-10-CM | POA: Diagnosis not present

## 2020-01-26 DIAGNOSIS — Z7982 Long term (current) use of aspirin: Secondary | ICD-10-CM | POA: Diagnosis not present

## 2020-01-26 DIAGNOSIS — I4891 Unspecified atrial fibrillation: Secondary | ICD-10-CM | POA: Insufficient documentation

## 2020-01-26 LAB — URINALYSIS, ROUTINE W REFLEX MICROSCOPIC
Bilirubin Urine: NEGATIVE
Glucose, UA: NEGATIVE mg/dL
Ketones, ur: NEGATIVE mg/dL
Nitrite: NEGATIVE
Protein, ur: NEGATIVE mg/dL
Specific Gravity, Urine: 1.015 (ref 1.005–1.030)
pH: 8.5 — ABNORMAL HIGH (ref 5.0–8.0)

## 2020-01-26 LAB — CBC WITH DIFFERENTIAL/PLATELET
Abs Immature Granulocytes: 0.02 10*3/uL (ref 0.00–0.07)
Basophils Absolute: 0.1 10*3/uL (ref 0.0–0.1)
Basophils Relative: 1 %
Eosinophils Absolute: 0.2 10*3/uL (ref 0.0–0.5)
Eosinophils Relative: 2 %
HCT: 41.2 % (ref 36.0–46.0)
Hemoglobin: 13.7 g/dL (ref 12.0–15.0)
Immature Granulocytes: 0 %
Lymphocytes Relative: 31 %
Lymphs Abs: 2.7 10*3/uL (ref 0.7–4.0)
MCH: 30.5 pg (ref 26.0–34.0)
MCHC: 33.3 g/dL (ref 30.0–36.0)
MCV: 91.8 fL (ref 80.0–100.0)
Monocytes Absolute: 0.8 10*3/uL (ref 0.1–1.0)
Monocytes Relative: 10 %
Neutro Abs: 5 10*3/uL (ref 1.7–7.7)
Neutrophils Relative %: 56 %
Platelets: 334 10*3/uL (ref 150–400)
RBC: 4.49 MIL/uL (ref 3.87–5.11)
RDW: 13.1 % (ref 11.5–15.5)
WBC: 8.8 10*3/uL (ref 4.0–10.5)
nRBC: 0 % (ref 0.0–0.2)

## 2020-01-26 LAB — TROPONIN I (HIGH SENSITIVITY)
Troponin I (High Sensitivity): 9 ng/L (ref <18)
Troponin I (High Sensitivity): 9 ng/L (ref <18)

## 2020-01-26 LAB — COMPREHENSIVE METABOLIC PANEL
ALT: 15 U/L (ref 0–44)
AST: 27 U/L (ref 15–41)
Albumin: 3.4 g/dL — ABNORMAL LOW (ref 3.5–5.0)
Alkaline Phosphatase: 84 U/L (ref 38–126)
Anion gap: 13 (ref 5–15)
BUN: 30 mg/dL — ABNORMAL HIGH (ref 8–23)
CO2: 29 mmol/L (ref 22–32)
Calcium: 9.2 mg/dL (ref 8.9–10.3)
Chloride: 96 mmol/L — ABNORMAL LOW (ref 98–111)
Creatinine, Ser: 2.09 mg/dL — ABNORMAL HIGH (ref 0.44–1.00)
GFR, Estimated: 24 mL/min — ABNORMAL LOW (ref >60)
Glucose, Bld: 229 mg/dL — ABNORMAL HIGH (ref 70–99)
Potassium: 4.2 mmol/L (ref 3.5–5.1)
Sodium: 138 mmol/L (ref 135–145)
Total Bilirubin: 1.1 mg/dL (ref 0.3–1.2)
Total Protein: 7.2 g/dL (ref 6.5–8.1)

## 2020-01-26 LAB — URINALYSIS, MICROSCOPIC (REFLEX): WBC, UA: >50 WBC/hpf (ref 0–5)

## 2020-01-26 LAB — PROTIME-INR
INR: 2.7 — ABNORMAL HIGH (ref 0.8–1.2)
Prothrombin Time: 27.8 seconds — ABNORMAL HIGH (ref 11.4–15.2)

## 2020-01-26 LAB — LIPASE, BLOOD: Lipase: 16 U/L (ref 11–51)

## 2020-01-26 MED ORDER — ONDANSETRON HCL 4 MG/2ML IJ SOLN
4.0000 mg | Freq: Once | INTRAMUSCULAR | Status: AC
  Administered 2020-01-26: 4 mg via INTRAVENOUS
  Filled 2020-01-26: qty 2

## 2020-01-26 MED ORDER — LIDOCAINE VISCOUS HCL 2 % MT SOLN
15.0000 mL | Freq: Once | OROMUCOSAL | Status: AC
  Administered 2020-01-26: 15 mL via ORAL
  Filled 2020-01-26: qty 15

## 2020-01-26 MED ORDER — SODIUM CHLORIDE 0.9 % IV BOLUS
500.0000 mL | Freq: Once | INTRAVENOUS | Status: AC
  Administered 2020-01-26: 500 mL via INTRAVENOUS

## 2020-01-26 MED ORDER — PROMETHAZINE HCL 25 MG/ML IJ SOLN
12.5000 mg | Freq: Once | INTRAMUSCULAR | Status: AC
  Administered 2020-01-26: 12.5 mg via INTRAVENOUS
  Filled 2020-01-26: qty 1

## 2020-01-26 MED ORDER — CEPHALEXIN 250 MG PO CAPS
500.0000 mg | ORAL_CAPSULE | Freq: Once | ORAL | Status: AC
  Administered 2020-01-26: 500 mg via ORAL
  Filled 2020-01-26: qty 2

## 2020-01-26 MED ORDER — ONDANSETRON 4 MG PO TBDP: 4.0000 mg | ORAL_TABLET | Freq: Three times a day (TID) | ORAL | 0 refills | Status: DC | PRN

## 2020-01-26 MED ORDER — ALUM & MAG HYDROXIDE-SIMETH 200-200-20 MG/5ML PO SUSP
30.0000 mL | Freq: Once | ORAL | Status: AC
  Administered 2020-01-26: 30 mL via ORAL
  Filled 2020-01-26: qty 30

## 2020-01-26 MED ORDER — MORPHINE SULFATE (PF) 4 MG/ML IV SOLN
4.0000 mg | Freq: Once | INTRAVENOUS | Status: AC
  Administered 2020-01-26: 4 mg via INTRAVENOUS
  Filled 2020-01-26: qty 1

## 2020-01-26 MED ORDER — CEPHALEXIN 500 MG PO CAPS: 500.0000 mg | ORAL_CAPSULE | Freq: Four times a day (QID) | ORAL | 0 refills | Status: DC

## 2020-01-26 NOTE — ED Notes (Signed)
Pt declines straight cath for urine, assisted to bedside commode to try to provide urine specimen.

## 2020-01-26 NOTE — ED Provider Notes (Signed)
[facility] EMERGENCY DEPARTMENT Provider Note   CSN: [id number] Arrival date & time: 01/26/20  8416     History Chief Complaint  Patient presents with  . Headache    [name] [name] is a 79 y.o. female.  Pt presents to the ED today with a headache.  She said it has been ongoing since yesterday.  She said she has had n/v and epigastric pain since yesterday as well.  No f/c.        Past Medical History:  Diagnosis Date  . Atrial fibrillation ([place])    SVT dx 2007, cath 2007 mild CAD, then had an cardioversion, ablation; still on coumadin , has occ palpitation, EKG 03-2010 NSR  . Blindness of left eye   . CKD (chronic kidney disease), stage III ([place]) 06/16/2016   [name] 06/17/2016  . DIABETES MELLITUS, TYPE II 03/27/2006   dr [name]  . Eye muscle weakness    Right eye weakness after cataract surgery  . GERD (gastroesophageal reflux disease) 10/05/2011  . 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 HYPERLIPIDEMIA 03/27/2006  . HYPERTENSION 03/27/2006  . Intertrochanteric fracture of right hip ([place]) 07/13/2012  . 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 Osteopenia 2004   Dexa 2004 showed Osteopenia, DEXA 03/2007 normal  . Osteopenia   . Other chronic cystitis with hematuria   . Pelvic fracture ([place]) 06/16/2016   S/P fall  . Recurrent urinary tract infection    Seeing Urology  . RETINOPATHY, BACKGROUND NOS 03/27/2006  . Seizures ([place])   . 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[place]) 08/06/3014    Patient Active Problem List   Diagnosis Date Noted  . 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 Osteoporosis 07/22/2018  . Closed compression fracture of L1 vertebra ([place]) 07/19/2018  . Closed compression fracture of body of L1 vertebra ([place])   . Closed L1 vertebral fracture ([place]) 07/18/2018  . Cardiomyopathy ([place]) 04/07/2018  . Foot ulcer ([place]) 07/24/2016  . Gram-negative infection   . CKD (chronic kidney disease) stage 3, GFR 30-59 ml/min  (HCC)   . Postoperative anemia due to acute blood loss 01/04/2016  . Left hip postoperative wound infection 01/02/2016  . CAD S/P percutaneous coronary angioplasty 05/20/2015  . Acute systolic CHF (congestive heart failure) ([place]) 05/20/2015  . Acute kidney injury superimposed on chronic kidney disease ([place]) 05/11/2015  . PCP NOTES >>>>>>>>>>>>>>>> 12/28/2014  . Memory loss 05/17/2014  . Diabetes ([place]) 01/23/2014  . Anxiety and depression, PCP notes  11/24/2013  . Severe obesity (BMI >= 40) ([place]) 07/14/2013  . Encounter for therapeutic drug monitoring 06/02/2013  . History of encephalitis 11/18/2012  . Intertrochanteric fracture of right hip ([place]) 07/13/2012  . GERD (gastroesophageal reflux disease) 10/05/2011  . Seizure disorder ([place]) 05/24/2011  . Annual physical exam >>>>>>>>>>>>>>>>>>>>>>>>>>>>>>>.. 02/10/2011  . Atrial fibrillation ([place])   . 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 Hyperlipidemia 03/27/2006  . Hereditary and idiopathic peripheral neuropathy 03/27/2006  . RETINOPATHY, BACKGROUND NOS 03/27/2006  . Essential hypertension 03/27/2006    Past Surgical History:  Procedure Laterality Date  . ANKLE FRACTURE SURGERY Bilateral 08/2015   "right one was in 2 places"  . CARDIAC CATHETERIZATION N/A 05/17/2015   Procedure: Left Heart Cath and Coronary Angiography;  Surgeon: Jettie Booze, MD;  Location: [facility];  Service: Cardiovascular;  Laterality: N/A;  . CARDIAC CATHETERIZATION N/A 05/17/2015   Procedure: Coronary Balloon Angioplasty;  Surgeon: Jettie Booze,  MD;  Location: [facility];  Service: Cardiovascular;  Laterality: N/A;  . FEMUR IM NAIL Right 07/15/2012   Procedure: INTRAMEDULLARY (IM) NAIL HIP;  Surgeon: Johnny Bridge, MD;  Location: [facility];  Service: Orthopedics;  Laterality: Right;  . FEMUR IM NAIL Left 12/02/2015   Procedure: INTRAMEDULLARY (IM) NAIL FEMORAL;  Surgeon: [name], MD;  Location: [facility];  Service: Orthopedics;  Laterality: Left;    . FRACTURE SURGERY    . INCISION AND DRAINAGE HIP Left 01/03/2016   Procedure: IRRIGATION AND DEBRIDEMENT HIP;  Surgeon: [name], MD;  Location: [facility];  Service: Orthopedics;  Laterality: Left;  . SHOULDER SURGERY Bilateral    "don't remember which side"     OB History   No obstetric history on file.     Family History  Problem Relation Age of Onset  . Diabetes Father   . Heart attack Father 91  . Diabetes Sister   . Drug abuse Son   . Cancer Neg Hx        no hx of colon or breast cancer    Social History   Tobacco Use  . Smoking status: Former Smoker    Types: Cigarettes    Quit date: 03/02/1978    Years since quitting: 41.9  . Smokeless tobacco: Never Used  Substance Use Topics  . Alcohol use: No    Alcohol/week: 0.0 standard drinks  . Drug use: No    Home Medications Prior to Admission medications   Medication Sig Start Date End Date Taking? Authorizing Provider  alendronate (FOSAMAX) 70 MG tablet Take 1 tablet (70 mg total) by mouth every 7 (seven) days. Take with a full glass of water on an empty stomach. 07/10/19   Colon Branch, MD  aspirin EC 81 MG tablet Take 1 tablet (81 mg total) by mouth daily. 04/12/18   Erlene Quan, PA-C  atorvastatin (LIPITOR) 80 MG tablet Take 1 tablet by mouth once daily 12/21/19   Colon Branch, MD  augmented betamethasone dipropionate (DIPROLENE-AF) 0.05 % cream Apply topically 2 (two) times daily. 06/09/19   Colon Branch, MD  calcium-vitamin D (OSCAL WITH D) 500-200 MG-UNIT per tablet Take 1 tablet by mouth daily.    [provider]  cephALEXin (KEFLEX) 500 MG capsule Take 1 capsule (500 mg total) by mouth 4 (four) times daily. 01/26/20   Isla Pence, MD  cetirizine (ZYRTEC) 10 MG tablet Take 1 tablet (10 mg total) by mouth daily. 09/12/19   Debbrah Alar, NP  fluticasone (FLONASE) 50 MCG/ACT nasal spray Place 2 sprays into both nostrils daily. 09/12/19   Debbrah Alar, NP  furosemide (LASIX) 40 MG tablet Take 1  tablet (40 mg total) by mouth 2 (two) times daily as needed for fluid. 06/22/19   Colon Branch, MD  insulin NPH-regular Human (NOVOLIN 70/30 RELION) (70-30) 100 UNIT/ML injection 21 units with breakfast and 5 units with the evening meal. 01/10/20   Renato Shin, MD  Multiple Vitamins-Minerals (MULTIVITAMIN ADULT PO) Take 1 tablet by mouth daily with breakfast.     [provider]  ondansetron (ZOFRAN ODT) 4 MG disintegrating tablet Take 1 tablet (4 mg total) by mouth every 8 (eight) hours as needed for nausea or vomiting. 01/26/20   Isla Pence, MD  warfarin (COUMADIN) 2.5 MG tablet TAKE AS DIRECTED PER COUMADIN CLINIC 09/18/19   Evans Lance, MD    Allergies    Procaine hcl and Amoxicillin  Review of  Systems   Review of Systems  Gastrointestinal: Positive for nausea and vomiting.  Neurological: Positive for headaches.  All other systems reviewed and are negative.   Physical Exam Updated Vital Signs BP (!) 117/55   Pulse 67   Temp 98 F (36.7 C) (Oral)   Resp 12   Ht 5\' 9"  (1.753 m)   Wt 79.4 kg   SpO2 90%   BMI 25.84 kg/m   Physical Exam Vitals and nursing note reviewed.  Constitutional:      Appearance: She is well-developed.  HENT:     Head: Normocephalic and atraumatic.     Mouth/Throat:     Mouth: Mucous membranes are moist.     Pharynx: Oropharynx is clear.  Eyes:     Comments: Left eye blind (chronic)  Cardiovascular:     Rate and Rhythm: Normal rate and regular rhythm.     Heart sounds: Normal heart sounds.  Pulmonary:     Effort: Pulmonary effort is normal.     Breath sounds: Normal breath sounds.  Abdominal:     General: Bowel sounds are normal.     Palpations: Abdomen is soft.  Musculoskeletal:        General: Normal range of motion.     Cervical back: Normal range of motion and neck supple.  Skin:    General: Skin is warm.     Capillary Refill: Capillary refill takes less than 2 seconds.  Neurological:     Mental Status: She is alert  and oriented to person, place, and time.  Psychiatric:        Mood and Affect: Mood normal.        Speech: Speech normal.        Behavior: Behavior normal.     ED Results / Procedures / Treatments   Labs (all labs ordered are listed, but only abnormal results are displayed) Labs Reviewed  COMPREHENSIVE METABOLIC PANEL - Abnormal; Notable for the following components:      Result Value   Chloride 96 (*)    Glucose, Bld 229 (*)    BUN 30 (*)    Creatinine, Ser 2.09 (*)    Albumin 3.4 (*)    GFR, Estimated 24 (*)    All other components within normal limits  URINALYSIS, ROUTINE W REFLEX MICROSCOPIC - Abnormal; Notable for the following components:   APPearance CLOUDY (*)    pH 8.5 (*)    Hgb urine dipstick TRACE (*)    Leukocytes,Ua LARGE (*)    All other components within normal limits  PROTIME-INR - Abnormal; Notable for the following components:   Prothrombin Time 27.8 (*)    INR 2.7 (*)    All other components within normal limits  URINALYSIS, MICROSCOPIC (REFLEX) - Abnormal; Notable for the following components:   Bacteria, UA MANY (*)    Non Squamous Epithelial PRESENT (*)    All other components within normal limits  CBC WITH DIFFERENTIAL/PLATELET  LIPASE, BLOOD  TROPONIN I (HIGH SENSITIVITY)  TROPONIN I (HIGH SENSITIVITY)    EKG EKG Interpretation  Date/Time:  Friday January 26 2020 09:32:52 EST Ventricular Rate:  87 PR Interval:    QRS Duration: 99 QT Interval:  387 QTC Calculation: 466 R Axis:   -26 Text Interpretation: Sinus rhythm Ventricular premature complex Borderline left axis deviation Probable anterior infarct, age indeterminate No significant change since last tracing Confirmed by Isla Pence [phone]) on 01/26/2020 9:40:13 AM   Radiology CT Head Wo Contrast  Result Date: 01/26/2020  CLINICAL DATA:  79 year old female with headache, nausea and vomiting since last night. EXAM: CT HEAD WITHOUT CONTRAST TECHNIQUE: Contiguous axial images were  obtained from the base of the skull through the vertex without intravenous contrast. COMPARISON:  Brain MRI 08/14/2011.  Head CT 10/02/2016. FINDINGS: Brain: Chronic left hemisphere encephalomalacia severely affecting the left temporal lobe with areas of chronic dystrophic calcification there. Cerebral volume is stable since 2018. Patchy and confluent superimposed bilateral cerebral white matter hypodensity. Stable gray-white matter differentiation throughout the brain. No midline shift, ventriculomegaly, mass effect, evidence of mass lesion, intracranial hemorrhage or evidence of cortically based acute infarction. Vascular: Calcified atherosclerosis at the skull base. No suspicious intracranial vascular hyperdensity. Skull: Stable.  No acute osseous abnormality identified. Sinuses/Orbits: Visualized paranasal sinuses and mastoids are stable and well pneumatized. Other: No acute orbit or scalp soft tissue finding. Some scalp vessel calcified atherosclerosis is evident. IMPRESSION: 1. No acute intracranial abnormality. Stable non contrast CT appearance of the brain since 2018. 2. Severe chronic left temporal lobe encephalomalacia with dystrophic calcifications. Evidence of superimposed chronic small vessel disease. Electronically Signed   By: Genevie Ann M.D.   On: 01/26/2020 11:16   DG Chest Portable 1 View  Result Date: 01/26/2020 CLINICAL DATA:  79 year old female with chest pain, nausea, vomiting, epigastric pain. EXAM: PORTABLE CHEST 1 VIEW COMPARISON:  Chest radiographs 10/02/2016 and earlier. FINDINGS: Portable AP semi upright view at 1050 hours. Lung volumes and mediastinal contours are stable since 2018. Small chronic right paratracheal surgical clips again noted. Tracheal air column remains within normal limits. Allowing for portable technique the lungs are clear. Mild eventration of the right hemidiaphragm is stable (normal variant). No acute osseous abnormality identified. IMPRESSION: No acute  cardiopulmonary abnormality. Electronically Signed   By: Genevie Ann M.D.   On: 01/26/2020 11:17    Procedures Procedures (including critical care time)  Medications Ordered in ED Medications  cephALEXin (KEFLEX) capsule 500 mg (has no administration in time range)  ondansetron (ZOFRAN) injection 4 mg (4 mg Intravenous Given 01/26/20 1033)  sodium chloride 0.9 % bolus 500 mL (0 mLs Intravenous Stopped 01/26/20 1159)  morphine 4 MG/ML injection 4 mg (4 mg Intravenous Given 01/26/20 1206)  promethazine (PHENERGAN) injection 12.5 mg (12.5 mg Intravenous Given 01/26/20 1217)  sodium chloride 0.9 % bolus 500 mL (0 mLs Intravenous Stopped 01/26/20 1317)  alum & mag hydroxide-simeth (MAALOX/MYLANTA) 200-200-20 MG/5ML suspension 30 mL (30 mLs Oral Given 01/26/20 1350)    And  lidocaine (XYLOCAINE) 2 % viscous mouth solution 15 mL (15 mLs Oral Given 01/26/20 1350)    ED Course  I have reviewed the triage vital signs and the nursing notes.  Pertinent labs & imaging results that were available during my care of the patient were reviewed by me and considered in my medical decision making (see chart for details).    MDM Rules/Calculators/A&P                          Pt is feeling much better.  She does have a UTI which probably caused her n/v.  Her h/a is likely from vomiting and dehydration.  Pt is stable for d/c.  She is able to tolerate pos.  Return if worse.  Final Clinical Impression(s) / ED Diagnoses Final diagnoses:  Acute nonintractable headache, unspecified headache type  Non-intractable vomiting with nausea, unspecified vomiting type  Acute cystitis without hematuria    Rx / DC Orders ED Discharge Orders  Ordered    ondansetron (ZOFRAN ODT) 4 MG disintegrating tablet  Every 8 hours PRN        01/26/20 1448    cephALEXin (KEFLEX) 500 MG capsule  4 times daily        01/26/20 1555           Isla Pence, MD 01/26/20 1557

## 2020-01-26 NOTE — ED Notes (Signed)
Pt in xray will get vs when pt returns

## 2020-01-26 NOTE — ED Triage Notes (Signed)
Pt reports headache, n/v, and epigastric pain since yesterday. Pt denies hx of migraines.

## 2020-01-29 ENCOUNTER — Other Ambulatory Visit: Payer: Self-pay

## 2020-01-29 ENCOUNTER — Ambulatory Visit (INDEPENDENT_AMBULATORY_CARE_PROVIDER_SITE_OTHER): Payer: Medicare Other | Admitting: *Deleted

## 2020-01-29 DIAGNOSIS — I482 Chronic atrial fibrillation, unspecified: Secondary | ICD-10-CM | POA: Diagnosis not present

## 2020-01-29 DIAGNOSIS — Z5181 Encounter for therapeutic drug level monitoring: Secondary | ICD-10-CM | POA: Diagnosis not present

## 2020-01-29 DIAGNOSIS — I480 Paroxysmal atrial fibrillation: Secondary | ICD-10-CM

## 2020-01-29 LAB — POCT INR: INR: 3.5 — AB (ref 2.0–3.0)

## 2020-01-29 NOTE — Patient Instructions (Signed)
Description   Hold warfarin today and then start taking 1 tablet daily except for 1/2 a tablet on Mondays and Fridays. Recheck INR in 2 weeks.  Call us with any medication changes [phone] to [facility]in Clinic.

## 2020-02-02 ENCOUNTER — Encounter: Payer: Self-pay | Admitting: Internal Medicine

## 2020-02-02 ENCOUNTER — Ambulatory Visit (INDEPENDENT_AMBULATORY_CARE_PROVIDER_SITE_OTHER): Payer: Medicare Other | Admitting: Internal Medicine

## 2020-02-02 ENCOUNTER — Other Ambulatory Visit: Payer: Self-pay

## 2020-02-02 VITALS — BP 132/81 | HR 78 | Temp 97.6°F | Resp 17 | Ht 69.0 in | Wt 169.0 lb

## 2020-02-02 DIAGNOSIS — I255 Ischemic cardiomyopathy: Secondary | ICD-10-CM | POA: Diagnosis not present

## 2020-02-02 DIAGNOSIS — N390 Urinary tract infection, site not specified: Secondary | ICD-10-CM

## 2020-02-02 DIAGNOSIS — N189 Chronic kidney disease, unspecified: Secondary | ICD-10-CM | POA: Diagnosis not present

## 2020-02-02 DIAGNOSIS — I120 Hypertensive chronic kidney disease with stage 5 chronic kidney disease or end stage renal disease: Secondary | ICD-10-CM

## 2020-02-02 DIAGNOSIS — N1832 Chronic kidney disease, stage 3b: Secondary | ICD-10-CM

## 2020-02-02 DIAGNOSIS — R519 Headache, unspecified: Secondary | ICD-10-CM | POA: Diagnosis not present

## 2020-02-02 DIAGNOSIS — N185 Chronic kidney disease, stage 5: Secondary | ICD-10-CM

## 2020-02-02 LAB — BASIC METABOLIC PANEL
BUN: 28 mg/dL — ABNORMAL HIGH (ref 6–23)
CO2: 34 mEq/L — ABNORMAL HIGH (ref 19–32)
Calcium: 9.5 mg/dL (ref 8.4–10.5)
Chloride: 95 mEq/L — ABNORMAL LOW (ref 96–112)
Creatinine, Ser: 2.36 mg/dL — ABNORMAL HIGH (ref 0.40–1.20)
GFR: 19.31 mL/min — ABNORMAL LOW (ref >60.00)
Glucose, Bld: 296 mg/dL — ABNORMAL HIGH (ref 70–99)
Potassium: 4.2 mEq/L (ref 3.5–5.1)
Sodium: 140 mEq/L (ref 135–145)

## 2020-02-02 NOTE — Patient Instructions (Signed)
See you in February  Be sure you drink plenty of water  If the headache or any of those symptoms come back please let us know  GO TO THE LAB : Get the blood work

## 2020-02-02 NOTE — Progress Notes (Signed)
Subjective:    Patient ID: [name] [name], female    DOB: 11-07-1940, 79 y.o.   MRN: [id number]  DOS:  02/02/2020 Type of visit - description: ER follow-up  Went to the ER 01/26/2020: Evaluated for headache. She also reported nausea vomiting and epigastric pain 1 day PTA. Extensive blood work and imaging.   CMP show creatinine of 2.9 above baseline. Troponin negative x2 CT head: Noncontrast, nonacute Chest x-ray nonacute  UA did show bacteria, no culture. Was treated with promethazine, morphine, improved and discharged home.  Also prescribed cephalexin for UTI.  Review of Systems Since she left the ER she is feeling well. No fever chills No headaches No dysuria, gross hematuria difficulty urinating. No new visual disturbances, dizziness or diplopia.    Wt Readings from Last 3 Encounters:  02/02/20 169 lb (76.7 kg)  01/26/20 175 lb (79.4 kg)  01/10/20 174 lb (78.9 kg)     Past Medical History:  Diagnosis Date  . Atrial fibrillation ([place])    SVT dx 2007, cath 2007 mild CAD, then had an cardioversion, ablation; still on coumadin , has occ palpitation, EKG 03-2010 NSR  . Blindness of left eye   . CKD (chronic kidney disease), stage III ([place]) 06/16/2016   [name] 06/17/2016  . DIABETES MELLITUS, TYPE II 03/27/2006   dr [name]  . Eye muscle weakness    Right eye weakness after cataract surgery  . GERD (gastroesophageal reflux disease) 10/05/2011  . 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 HYPERLIPIDEMIA 03/27/2006  . HYPERTENSION 03/27/2006  . Intertrochanteric fracture of right hip ([place]) 07/13/2012  . 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 Osteopenia 2004   Dexa 2004 showed Osteopenia, DEXA 03/2007 normal  . Osteopenia   . Other chronic cystitis with hematuria   . Pelvic fracture ([place]) 06/16/2016   S/P fall  . Recurrent urinary tract infection    Seeing Urology  . 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 Seizures ([place])    . 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[place]) 06/23/5359    Past Surgical History:  Procedure Laterality Date  . ANKLE FRACTURE SURGERY Bilateral 08/2015   "right one was in 2 places"  . CARDIAC CATHETERIZATION N/A 05/17/2015   Procedure: Left Heart Cath and Coronary Angiography;  Surgeon: Jettie Booze, MD;  Location: [facility];  Service: Cardiovascular;  Laterality: N/A;  . CARDIAC CATHETERIZATION N/A 05/17/2015   Procedure: Coronary Balloon Angioplasty;  Surgeon: Jettie Booze, MD;  Location: [facility];  Service: Cardiovascular;  Laterality: N/A;  . FEMUR IM NAIL Right 07/15/2012   Procedure: INTRAMEDULLARY (IM) NAIL HIP;  Surgeon: Johnny Bridge, MD;  Location: [facility];  Service: Orthopedics;  Laterality: Right;  . FEMUR IM NAIL Left 12/02/2015   Procedure: INTRAMEDULLARY (IM) NAIL FEMORAL;  Surgeon: [name], MD;  Location: [facility];  Service: Orthopedics;  Laterality: Left;  . FRACTURE SURGERY    . INCISION AND DRAINAGE HIP Left 01/03/2016   Procedure: IRRIGATION AND DEBRIDEMENT HIP;  Surgeon: [name], MD;  Location: [facility];  Service: Orthopedics;  Laterality: Left;  . SHOULDER SURGERY Bilateral    "don't remember which side"    Allergies as of 02/02/2020      Reactions   Procaine Hcl Anaphylaxis   Amoxicillin Itching   Did it involve swelling of the face/tongue/throat, SOB, or low BP? No Did it involve sudden or severe rash/hives, skin  peeling, or any reaction on the inside of your mouth or nose? No Did you need to seek medical attention at a hospital or doctor's office? No When did it last happen?unk If all above answers are "NO", may proceed with cephalosporin use.      Medication List       Accurate as of February 02, 2020 11:59 PM. If you have any questions, ask your nurse or doctor.        alendronate 70 MG tablet Commonly known as: FOSAMAX Take 1 tablet (70 mg total) by mouth every 7 (seven) days. Take with a full glass of water on an empty stomach.    aspirin EC 81 MG tablet Take 1 tablet (81 mg total) by mouth daily.   atorvastatin 80 MG tablet Commonly known as: LIPITOR Take 1 tablet by mouth once daily   augmented betamethasone dipropionate 0.05 % cream Commonly known as: DIPROLENE-AF Apply topically 2 (two) times daily.   calcium-vitamin D 500-200 MG-UNIT tablet Commonly known as: OSCAL WITH D Take 1 tablet by mouth daily.   cephALEXin 500 MG capsule Commonly known as: KEFLEX Take 1 capsule (500 mg total) by mouth 4 (four) times daily.   cetirizine 10 MG tablet Commonly known as: ZYRTEC Take 1 tablet (10 mg total) by mouth daily.   fluticasone 50 MCG/ACT nasal spray Commonly known as: FLONASE Place 2 sprays into both nostrils daily.   furosemide 40 MG tablet Commonly known as: LASIX Take 1 tablet (40 mg total) by mouth 2 (two) times daily as needed for fluid.   MULTIVITAMIN ADULT PO Take 1 tablet by mouth daily with breakfast.   NovoLIN 70/30 ReliOn (70-30) 100 UNIT/ML injection Generic drug: insulin NPH-regular Human 21 units with breakfast and 5 units with the evening meal.   ondansetron 4 MG disintegrating tablet Commonly known as: Zofran ODT Take 1 tablet (4 mg total) by mouth every 8 (eight) hours as needed for nausea or vomiting.   warfarin 2.5 MG tablet Commonly known as: COUMADIN Take as directed by the anticoagulation clinic. If you are unsure how to take this medication, talk to your nurse or doctor. Original instructions: TAKE AS DIRECTED PER COUMADIN CLINIC          Objective:   Physical Exam BP 132/81 (BP Location: Left Arm, Patient Position: Sitting, Cuff Size: Normal)   Pulse 78   Temp 97.6 F (36.4 C) (Oral)   Resp 17   Ht 5\' 9"  (1.753 m)   Wt 169 lb (76.7 kg)   SpO2 97%   BMI 24.96 kg/m  General:   Well developed, NAD, BMI noted. HEENT:  Normocephalic . Face symmetric, atraumatic Lungs:  CTA B Normal respiratory effort, no intercostal retractions, no accessory muscle use.  Heart: Seems regular today.  lower extremities: no pretibial edema bilaterally  Skin: Not pale. Not jaundice Neurologic:  alert & oriented X3.  Speech normal, gait appropriate for age, assisted by a walker.  Motor: No obvious deficits Psych--  Cognition and judgment appear intact.  Cooperative with normal attention span and concentration.  Behavior appropriate. No anxious or depressed appearing.  Seems in good spirits     Assessment       Assessment   DM  + retinopathy, Dr [name] HTN CKD:  -Creatinine 1.5 = GFR ~35 -Elevated creatinine(started after admission 05-2015, had IV contrast) Hyperlipidemia anxiety ativan GERD Anemia, normal iron 05-2015, never had a colonoscopy CV ---NSTEMI 05-2015, cath, angiplasty ---Echo, EF 40 % (05-2015) ---Atrial fibrillation, SVT --  dx 2007, cath 2007 mild CAD. 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 Since the ER visit she is now asymptomatic and doing well. Denies any LUTS or fever. This is the first headache in many years, not the worst of her life. At this point she is doing well, no further evaluation seems to be needed but asked her to call if symptoms resurface. CKD: Creatinine was noted to be above normal in the context of nausea and vomiting.  Recheck a BMP  RTC already scheduled for 04-2020.  This visit occurred during the SARS-CoV-2 public health emergency.   Safety protocols were in place, including screening questions prior to the visit, additional usage of staff PPE, and extensive cleaning of exam room while observing appropriate contact time as indicated for disinfecting solutions.

## 2020-02-04 NOTE — Assessment & Plan Note (Signed)
Headache, nausea, vomiting, epigastric pain, UTI: Was recently seen at the ER with above symptoms, found to have UTI (no urine culture). Since the ER visit she is now asymptomatic and doing well. Denies any LUTS or fever. This is the first headache in many years, not the worst of her life. At this point she is doing well, no further evaluation seems to be needed but asked her to call if symptoms resurface. CKD: Creatinine was noted to be above normal in the context of nausea and vomiting.  Recheck a BMP  RTC already scheduled for 04-2020.

## 2020-02-05 ENCOUNTER — Other Ambulatory Visit: Payer: Self-pay

## 2020-02-05 DIAGNOSIS — I10 Essential (primary) hypertension: Secondary | ICD-10-CM

## 2020-02-05 NOTE — Addendum Note (Signed)
Addended by: Tora Kindred on: 02/05/2020 09:45 AM   Modules accepted: Orders

## 2020-02-12 ENCOUNTER — Ambulatory Visit (INDEPENDENT_AMBULATORY_CARE_PROVIDER_SITE_OTHER): Payer: Medicare Other | Admitting: *Deleted

## 2020-02-12 ENCOUNTER — Other Ambulatory Visit: Payer: Self-pay

## 2020-02-12 DIAGNOSIS — Z5181 Encounter for therapeutic drug level monitoring: Secondary | ICD-10-CM | POA: Diagnosis not present

## 2020-02-12 DIAGNOSIS — I482 Chronic atrial fibrillation, unspecified: Secondary | ICD-10-CM

## 2020-02-12 DIAGNOSIS — I480 Paroxysmal atrial fibrillation: Secondary | ICD-10-CM

## 2020-02-12 LAB — POCT INR: INR: 1.4 — AB (ref 2.0–3.0)

## 2020-02-12 NOTE — Patient Instructions (Signed)
Description   Today take 1 tablet and tomorrow take 1.5 tablets then continue taking 1 tablet daily except for 1/2 tablet on Mondays and Fridays. Recheck INR in 2 weeks per pt request.  Call us with any medication changes [phone] to Coumadin Clinic.

## 2020-02-15 ENCOUNTER — Other Ambulatory Visit (INDEPENDENT_AMBULATORY_CARE_PROVIDER_SITE_OTHER): Payer: Medicare Other

## 2020-02-15 ENCOUNTER — Other Ambulatory Visit: Payer: Self-pay

## 2020-02-15 DIAGNOSIS — I10 Essential (primary) hypertension: Secondary | ICD-10-CM

## 2020-02-15 LAB — BASIC METABOLIC PANEL
BUN: 25 mg/dL — ABNORMAL HIGH (ref 6–23)
CO2: 33 mEq/L — ABNORMAL HIGH (ref 19–32)
Calcium: 9.1 mg/dL (ref 8.4–10.5)
Chloride: 98 mEq/L (ref 96–112)
Creatinine, Ser: 1.77 mg/dL — ABNORMAL HIGH (ref 0.40–1.20)
GFR: 27.26 mL/min — ABNORMAL LOW (ref >60.00)
Glucose, Bld: 245 mg/dL — ABNORMAL HIGH (ref 70–99)
Potassium: 4.3 mEq/L (ref 3.5–5.1)
Sodium: 137 mEq/L (ref 135–145)

## 2020-02-19 ENCOUNTER — Telehealth: Payer: Self-pay | Admitting: Internal Medicine

## 2020-02-19 NOTE — Telephone Encounter (Signed)
See lab result notes

## 2020-02-19 NOTE — Telephone Encounter (Signed)
Patient states she is calling in reference to lab results.

## 2020-02-19 NOTE — Addendum Note (Signed)
Addended byDamita Dunnings D on: 02/19/2020 10:22 AM   Modules accepted: Orders

## 2020-02-21 ENCOUNTER — Emergency Department (HOSPITAL_BASED_OUTPATIENT_CLINIC_OR_DEPARTMENT_OTHER)
Admission: EM | Admit: 2020-02-21 | Discharge: 2020-02-21 | Disposition: A | Discharge status: Home / Self Care | Payer: Medicare Other | Attending: Emergency Medicine | Admitting: Emergency Medicine

## 2020-02-21 ENCOUNTER — Other Ambulatory Visit: Payer: Self-pay

## 2020-02-21 ENCOUNTER — Emergency Department (HOSPITAL_BASED_OUTPATIENT_CLINIC_OR_DEPARTMENT_OTHER): Payer: Medicare Other

## 2020-02-21 ENCOUNTER — Ambulatory Visit (INDEPENDENT_AMBULATORY_CARE_PROVIDER_SITE_OTHER): Payer: Medicare Other | Admitting: Internal Medicine

## 2020-02-21 ENCOUNTER — Encounter (HOSPITAL_BASED_OUTPATIENT_CLINIC_OR_DEPARTMENT_OTHER): Payer: Self-pay

## 2020-02-21 VITALS — BP 100/66 | HR 66 | Ht 69.0 in

## 2020-02-21 DIAGNOSIS — R42 Dizziness and giddiness: Secondary | ICD-10-CM

## 2020-02-21 DIAGNOSIS — Z7982 Long term (current) use of aspirin: Secondary | ICD-10-CM | POA: Diagnosis not present

## 2020-02-21 DIAGNOSIS — R112 Nausea with vomiting, unspecified: Secondary | ICD-10-CM | POA: Insufficient documentation

## 2020-02-21 DIAGNOSIS — R531 Weakness: Secondary | ICD-10-CM | POA: Diagnosis not present

## 2020-02-21 DIAGNOSIS — R519 Headache, unspecified: Secondary | ICD-10-CM | POA: Diagnosis not present

## 2020-02-21 DIAGNOSIS — I130 Hypertensive heart and chronic kidney disease with heart failure and stage 1 through stage 4 chronic kidney disease, or unspecified chronic kidney disease: Secondary | ICD-10-CM | POA: Diagnosis not present

## 2020-02-21 DIAGNOSIS — I2510 Atherosclerotic heart disease of native coronary artery without angina pectoris: Secondary | ICD-10-CM | POA: Diagnosis not present

## 2020-02-21 DIAGNOSIS — I5021 Acute systolic (congestive) heart failure: Secondary | ICD-10-CM | POA: Diagnosis not present

## 2020-02-21 DIAGNOSIS — I255 Ischemic cardiomyopathy: Secondary | ICD-10-CM | POA: Diagnosis not present

## 2020-02-21 DIAGNOSIS — E1122 Type 2 diabetes mellitus with diabetic chronic kidney disease: Secondary | ICD-10-CM | POA: Insufficient documentation

## 2020-02-21 DIAGNOSIS — N390 Urinary tract infection, site not specified: Secondary | ICD-10-CM

## 2020-02-21 DIAGNOSIS — Z87891 Personal history of nicotine dependence: Secondary | ICD-10-CM | POA: Diagnosis not present

## 2020-02-21 DIAGNOSIS — Z7901 Long term (current) use of anticoagulants: Secondary | ICD-10-CM | POA: Diagnosis not present

## 2020-02-21 DIAGNOSIS — N183 Chronic kidney disease, stage 3 unspecified: Secondary | ICD-10-CM | POA: Diagnosis not present

## 2020-02-21 DIAGNOSIS — Z794 Long term (current) use of insulin: Secondary | ICD-10-CM | POA: Diagnosis not present

## 2020-02-21 DIAGNOSIS — R109 Unspecified abdominal pain: Secondary | ICD-10-CM | POA: Diagnosis present

## 2020-02-21 DIAGNOSIS — Z79899 Other long term (current) drug therapy: Secondary | ICD-10-CM | POA: Diagnosis not present

## 2020-02-21 LAB — CBC WITH DIFFERENTIAL/PLATELET
Abs Immature Granulocytes: 0.02 10*3/uL (ref 0.00–0.07)
Basophils Absolute: 0.1 10*3/uL (ref 0.0–0.1)
Basophils Relative: 1 %
Eosinophils Absolute: 0.1 10*3/uL (ref 0.0–0.5)
Eosinophils Relative: 1 %
HCT: 40.3 % (ref 36.0–46.0)
Hemoglobin: 13.5 g/dL (ref 12.0–15.0)
Immature Granulocytes: 0 %
Lymphocytes Relative: 27 %
Lymphs Abs: 2.5 10*3/uL (ref 0.7–4.0)
MCH: 30.8 pg (ref 26.0–34.0)
MCHC: 33.5 g/dL (ref 30.0–36.0)
MCV: 91.8 fL (ref 80.0–100.0)
Monocytes Absolute: 0.8 10*3/uL (ref 0.1–1.0)
Monocytes Relative: 8 %
Neutro Abs: 6 10*3/uL (ref 1.7–7.7)
Neutrophils Relative %: 63 %
Platelets: 292 10*3/uL (ref 150–400)
RBC: 4.39 MIL/uL (ref 3.87–5.11)
RDW: 13 % (ref 11.5–15.5)
WBC: 9.5 10*3/uL (ref 4.0–10.5)
nRBC: 0 % (ref 0.0–0.2)

## 2020-02-21 LAB — COMPREHENSIVE METABOLIC PANEL
ALT: 17 U/L (ref 0–44)
AST: 35 U/L (ref 15–41)
Albumin: 3.7 g/dL (ref 3.5–5.0)
Alkaline Phosphatase: 82 U/L (ref 38–126)
Anion gap: 16 — ABNORMAL HIGH (ref 5–15)
BUN: 37 mg/dL — ABNORMAL HIGH (ref 8–23)
CO2: 26 mmol/L (ref 22–32)
Calcium: 9.5 mg/dL (ref 8.9–10.3)
Chloride: 96 mmol/L — ABNORMAL LOW (ref 98–111)
Creatinine, Ser: 2.03 mg/dL — ABNORMAL HIGH (ref 0.44–1.00)
GFR, Estimated: 25 mL/min — ABNORMAL LOW (ref >60)
Glucose, Bld: 130 mg/dL — ABNORMAL HIGH (ref 70–99)
Potassium: 3.3 mmol/L — ABNORMAL LOW (ref 3.5–5.1)
Sodium: 138 mmol/L (ref 135–145)
Total Bilirubin: 1 mg/dL (ref 0.3–1.2)
Total Protein: 7.7 g/dL (ref 6.5–8.1)

## 2020-02-21 LAB — URINALYSIS, ROUTINE W REFLEX MICROSCOPIC
Bilirubin Urine: NEGATIVE
Glucose, UA: NEGATIVE mg/dL
Ketones, ur: NEGATIVE mg/dL
Leukocytes,Ua: NEGATIVE
Nitrite: NEGATIVE
Protein, ur: NEGATIVE mg/dL
Specific Gravity, Urine: 1.005 (ref 1.005–1.030)
pH: 8 (ref 5.0–8.0)

## 2020-02-21 LAB — LIPASE, BLOOD: Lipase: 16 U/L (ref 11–51)

## 2020-02-21 LAB — URINALYSIS, MICROSCOPIC (REFLEX)

## 2020-02-21 MED ORDER — CEFDINIR 300 MG PO CAPS: 300.0000 mg | ORAL_CAPSULE | Freq: Two times a day (BID) | ORAL | 0 refills | Status: AC

## 2020-02-21 MED ORDER — POTASSIUM CHLORIDE 10 MEQ/100ML IV SOLN
10.0000 meq | Freq: Once | INTRAVENOUS | Status: AC
  Administered 2020-02-21: 16:00:00 10 meq via INTRAVENOUS
  Filled 2020-02-21: qty 100

## 2020-02-21 MED ORDER — MECLIZINE HCL 12.5 MG PO TABS: 12.5000 mg | ORAL_TABLET | Freq: Two times a day (BID) | ORAL | 0 refills | Status: DC | PRN

## 2020-02-21 MED ORDER — SODIUM CHLORIDE 0.9 % IV SOLN
INTRAVENOUS | Status: DC | PRN
  Administered 2020-02-21: 16:00:00 500 mL via INTRAVENOUS

## 2020-02-21 MED ORDER — ONDANSETRON HCL 4 MG/2ML IJ SOLN
4.0000 mg | Freq: Once | INTRAMUSCULAR | Status: AC
  Administered 2020-02-21: 16:00:00 4 mg via INTRAVENOUS
  Filled 2020-02-21: qty 2

## 2020-02-21 MED ORDER — MECLIZINE HCL 25 MG PO TABS
12.5000 mg | ORAL_TABLET | Freq: Once | ORAL | Status: AC
  Administered 2020-02-21: 17:00:00 12.5 mg via ORAL
  Filled 2020-02-21: qty 1

## 2020-02-21 MED ORDER — SODIUM CHLORIDE 0.9 % IV BOLUS
500.0000 mL | Freq: Once | INTRAVENOUS | Status: AC
  Administered 2020-02-21: 14:00:00 500 mL via INTRAVENOUS

## 2020-02-21 MED ORDER — CEFDINIR 300 MG PO CAPS
300.0000 mg | ORAL_CAPSULE | Freq: Once | ORAL | Status: DC
  Filled 2020-02-21: qty 1

## 2020-02-21 NOTE — ED Notes (Signed)
Patient denies pain and is resting comfortably.  

## 2020-02-21 NOTE — Discharge Instructions (Addendum)
You were evaluated in the emergency department for your dizziness, nausea and vomiting. Your vital signs, physical exam, blood work, and CT scans were very reassuring.  Your blood work did reveal mildly low potassium, however we replaced it through your IV while in the emergency department.  Your urine test did reveal signs of urinary tract infection.  You have been prescribed an antibiotic to take for the next week.  I recommend you follow-up with your primary care doctor after completing antibiotic for retest of your urine, to ensure clearance of your infection.  Additionally I suspect you have a component of vertigo to your symptoms, which is sensation of spinning/dizziness.  You have been prescribed a medication called Antivert (meclizine) which you may take up to twice per day as needed for dizziness.  Again please follow-up close with your primary care doctor for management of the symptoms.  Return the emergency department develop any worsening headache, dizziness, if you pass out, nausea or vomiting that does not stop, or other new severe symptoms.

## 2020-02-21 NOTE — ED Provider Notes (Signed)
[facility] EMERGENCY DEPARTMENT Provider Note   CSN: [id number] Arrival date & time: 02/21/20  1139     History Chief Complaint  Patient presents with  . Abdominal Pain    [name] [name] is a 79 y.o. female who presents with concern for 1 day of "feeling like I am spinning", unsteadiness, nausea and vomiting, and mild pulsing headache.  Patient had similar episode back in November, was diagnosed urinary tract infection at that time.  Patient with history of severe glaucoma, denies worsening of her vision, double vision at this time, however difficult to assess given underlying disease.  Denies confusion, her husband is at the bedside who endorses that she is acting like herself has not been confused.  Endorses 2 episodes of NBNB emesis since yesterday.  Denies abdominal pain, diarrhea at this time.  States that she has drank only a small Ensure drink this morning, due to nausea.  Denies urinary frequency, urgency, dysuria, urinary incontinence.  Patient was seen by primary care doctor today, with finding of hypoxia to 88% on room air.  She was sent to the emergency department for further evaluation.  Patient denies chest pain, shortness of breath, palpitations at this time.  I personally reviewed this patient's medical records.  History of hypertension, hyperlipidemia, glaucoma, atrial fibrillation, GERD, diabetes, CAD with percutaneous angioplasty, CKD.  HPI     Past Medical History:  Diagnosis Date  . Atrial fibrillation ([place])    SVT dx 2007, cath 2007 mild CAD, then had an cardioversion, ablation; still on coumadin , has occ palpitation, EKG 03-2010 NSR  . Blindness of left eye   . CKD (chronic kidney disease), stage III ([place]) 06/16/2016   [name] [name] 06/17/2016  . DIABETES MELLITUS, TYPE II 03/27/2006   dr [name] [name]  . Eye muscle weakness    Right eye weakness after cataract surgery  . GERD (gastroesophageal reflux disease) 10/05/2011  . 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  HYPERLIPIDEMIA 03/27/2006  . HYPERTENSION 03/27/2006  . Intertrochanteric fracture of right hip ([place]) 07/13/2012  . 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 Osteopenia 2004   Dexa 2004 showed Osteopenia, DEXA 03/2007 normal  . Osteopenia   . Other chronic cystitis with hematuria   . Pelvic fracture ([place]) 06/16/2016   S/P fall  . Recurrent urinary tract infection    Seeing Urology  . 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 Seizures ([place])   . 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[place]) 06/11/8784    Patient Active Problem List   Diagnosis Date Noted  . 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 Osteoporosis 07/22/2018  . Closed compression fracture of L1 vertebra ([place]) 07/19/2018  . Closed compression fracture of body of L1 vertebra ([place])   . Closed L1 vertebral fracture ([place]) 07/18/2018  . Cardiomyopathy ([place]) 04/07/2018  . Foot ulcer ([place]) 07/24/2016  . Gram-negative infection   . CKD (chronic kidney disease) stage 3, GFR 30-59 ml/min (H[facility])   . Postoperative anemia due to acute blood loss 01/04/2016  . Left hip postoperative wound infection 01/02/2016  . CAD S/P percutaneous coronary angioplasty 05/20/2015  . Acute systolic CHF (congestive heart failure) ([place]) 05/20/2015  . Acute kidney injury superimposed on chronic kidney disease ([place]) 05/11/2015  . PCP NOTES >>>>>>>>>>>>>>>> 12/28/2014  . Memory loss 05/17/2014  . Diabetes ([place]) 01/23/2014  . Anxiety and depression, PCP notes  11/24/2013  . Severe obesity (BMI >= 40) ([place]) 07/14/2013  .  Encounter for therapeutic drug monitoring 06/02/2013  . History of encephalitis 11/18/2012  . Intertrochanteric fracture of right hip ([place]) 07/13/2012  . GERD (gastroesophageal reflux disease) 10/05/2011  . Seizure disorder ([place]) 05/24/2011  . Annual physical exam >>>>>>>>>>>>>>>>>>>>>>>>>>>>>>>.. 02/10/2011  . Atrial fibrillation ([place])   . 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 Hyperlipidemia 03/27/2006  . Hereditary  and idiopathic peripheral neuropathy 03/27/2006  . RETINOPATHY, BACKGROUND NOS 03/27/2006  . Essential hypertension 03/27/2006    Past Surgical History:  Procedure Laterality Date  . ANKLE FRACTURE SURGERY Bilateral 08/2015   "right one was in 2 places"  . CARDIAC CATHETERIZATION N/A 05/17/2015   Procedure: Left Heart Cath and Coronary Angiography;  Surgeon: [name] Booze, MD;  Location: [facility];  Service: Cardiovascular;  Laterality: N/A;  . CARDIAC CATHETERIZATION N/A 05/17/2015   Procedure: Coronary Balloon Angioplasty;  Surgeon: [name] Booze, MD;  Location: [facility];  Service: Cardiovascular;  Laterality: N/A;  . FEMUR IM NAIL Right 07/15/2012   Procedure: INTRAMEDULLARY (IM) NAIL HIP;  Surgeon: [name] Bridge, MD;  Location: [facility];  Service: Orthopedics;  Laterality: Right;  . FEMUR IM NAIL Left 12/02/2015   Procedure: INTRAMEDULLARY (IM) NAIL FEMORAL;  Surgeon: [name] Killings, MD;  Location: [facility];  Service: Orthopedics;  Laterality: Left;  . FRACTURE SURGERY    . INCISION AND DRAINAGE HIP Left 01/03/2016   Procedure: IRRIGATION AND DEBRIDEMENT HIP;  Surgeon: [name] Killings, MD;  Location: [facility];  Service: Orthopedics;  Laterality: Left;  . SHOULDER SURGERY Bilateral    "don't remember which side"     OB History   No obstetric history on file.     Family History  Problem Relation Age of Onset  . Diabetes Father   . Heart attack Father 1  . Diabetes Sister   . Drug abuse Son   . Cancer Neg Hx        no hx of colon or breast cancer    Social History   Tobacco Use  . Smoking status: Former Smoker    Types: Cigarettes    Quit date: 03/02/1978    Years since quitting: 42.0  . Smokeless tobacco: Never Used  Vaping Use  . Vaping Use: Never used  Substance Use Topics  . Alcohol use: No    Alcohol/week: 0.0 standard drinks  . Drug use: No    Home Medications Prior to Admission medications   Medication Sig Start Date End Date Taking?  Authorizing Provider  alendronate (FOSAMAX) 70 MG tablet Take 1 tablet (70 mg total) by mouth every 7 (seven) days. Take with a full glass of water on an empty stomach. 07/10/19   Colon Branch, MD  aspirin EC 81 MG tablet Take 1 tablet (81 mg total) by mouth daily. 04/12/18   Erlene Quan, PA-C  atorvastatin (LIPITOR) 80 MG tablet Take 1 tablet by mouth once daily 12/21/19   Colon Branch, MD  augmented betamethasone dipropionate (DIPROLENE-AF) 0.05 % cream Apply topically 2 (two) times daily. 06/09/19   Colon Branch, MD  calcium-vitamin D (OSCAL WITH D) 500-200 MG-UNIT per tablet Take 1 tablet by mouth daily.    [provider]  cefdinir (OMNICEF) 300 MG capsule Take 1 capsule (300 mg total) by mouth 2 (two) times daily for 7 days. 02/21/20 02/28/20  Rose Hegner, Gypsy Balsam, PA-C  cetirizine (ZYRTEC) 10 MG tablet Take 1 tablet (10 mg total) by mouth daily. 09/12/19   Debbrah Alar, NP  fluticasone (FLONASE) 50 MCG/ACT nasal spray Place 2 sprays into both nostrils daily. 09/12/19   Debbrah Alar, NP  furosemide (LASIX) 40 MG tablet Take 1 tablet (40 mg total) by mouth daily. 02/19/20   Colon Branch, MD  insulin NPH-regular Human (NOVOLIN 70/30 RELION) (70-30) 100 UNIT/ML injection 21 units with breakfast and 5 units with the evening meal. 01/10/20   Renato Shin, MD  meclizine (ANTIVERT) 12.5 MG tablet Take 1 tablet (12.5 mg total) by mouth 2 (two) times daily as needed for dizziness. 02/21/20   Aidan Moten, Gypsy Balsam, PA-C  Multiple Vitamins-Minerals (MULTIVITAMIN ADULT PO) Take 1 tablet by mouth daily with breakfast.     [provider]  ondansetron (ZOFRAN ODT) 4 MG disintegrating tablet Take 1 tablet (4 mg total) by mouth every 8 (eight) hours as needed for nausea or vomiting. 01/26/20   Isla Pence, MD  warfarin (COUMADIN) 2.5 MG tablet TAKE AS DIRECTED PER COUMADIN CLINIC 09/18/19   Evans Lance, MD    Allergies    Procaine hcl and Amoxicillin  Review of Systems    Review of Systems  Constitutional: Positive for appetite change. Negative for activity change, diaphoresis, fatigue and fever.  HENT: Negative.   Eyes: Negative.   Respiratory: Negative for cough, chest tightness, shortness of breath and wheezing.   Cardiovascular: Negative for chest pain, palpitations and leg swelling.  Gastrointestinal: Positive for nausea and vomiting. Negative for abdominal pain and diarrhea.  Endocrine: Negative.   Genitourinary: Negative for dysuria, frequency, hematuria, urgency and vaginal discharge.  Musculoskeletal: Negative.   Neurological: Positive for dizziness and headaches. Negative for tremors, seizures, syncope, speech difficulty and weakness.  Hematological: Negative.   Psychiatric/Behavioral: Negative.     Physical Exam Updated Vital Signs BP 137/88 (BP Location: Right Arm)   Pulse 74   Temp 98 F (36.7 C) (Oral)   Resp 14   SpO2 100%   Physical Exam Vitals and nursing note reviewed.  HENT:     Head: Normocephalic and atraumatic.     Nose: Nose normal.     Mouth/Throat:     Mouth: Mucous membranes are moist.     Pharynx: Oropharynx is clear. Uvula midline. No oropharyngeal exudate or posterior oropharyngeal erythema.     Tonsils: No tonsillar exudate.  Eyes:     General: No scleral icterus.       Right eye: No discharge.        Left eye: No discharge.     Conjunctiva/sclera: Conjunctivae normal.     Pupils: Pupils are equal, round, and reactive to light.     Comments: Unable to perform EOM or visual field evaluations, as patient has advanced glaucoma and therefore significant deficit in her vision.  Neck:     Trachea: Trachea and phonation normal.  Cardiovascular:     Rate and Rhythm: Normal rate and regular rhythm.     Pulses:          Radial pulses are 2+ on the right side and 2+ on the left side.       Dorsalis pedis pulses are 2+ on the right side and 2+ on the left side.     Heart sounds: Normal heart sounds. No murmur  heard.   Pulmonary:     Effort: Pulmonary effort is normal. No prolonged expiration or respiratory distress.     Breath sounds: Normal breath sounds. No wheezing or rales.  Chest:     Chest wall: No deformity, swelling, tenderness or crepitus.  Abdominal:     General: Bowel sounds are normal. There is no distension.     Palpations: Abdomen is soft.     Tenderness: There is abdominal tenderness in the right upper quadrant and epigastric area. There is no right CVA tenderness or left CVA tenderness.  Musculoskeletal:        General: No deformity.     Cervical back: Neck supple. No rigidity or crepitus. No pain with movement or spinous process tenderness.     Right lower leg: No edema.     Left lower leg: No edema.  Lymphadenopathy:     Cervical: No cervical adenopathy.  Skin:    General: Skin is warm and dry.     Capillary Refill: Capillary refill takes less than 2 seconds.  Neurological:     General: No focal deficit present.     Mental Status: She is alert and oriented to person, place, and time. Mental status is at baseline.     Sensory: Sensation is intact.     Motor: Motor function is intact.     Coordination: Coordination is intact.     Gait: Gait is intact.     Comments: CN V, VII-XII intact, II/III/IV/VI not evaluated due to patient's underlying glaucoma.   Psychiatric:        Mood and Affect: Mood normal.     ED Results / Procedures / Treatments   Labs (all labs ordered are listed, but only abnormal results are displayed) Labs Reviewed  URINALYSIS, ROUTINE W REFLEX MICROSCOPIC - Abnormal; Notable for the following components:      Result Value   APPearance CLOUDY (*)    Hgb urine dipstick TRACE (*)    All other components within normal limits  COMPREHENSIVE METABOLIC PANEL - Abnormal; Notable for the following components:   Potassium 3.3 (*)    Chloride 96 (*)    Glucose, Bld 130 (*)    BUN 37 (*)    Creatinine, Ser 2.03 (*)    GFR, Estimated 25 (*)    Anion  gap 16 (*)    All other components within normal limits  URINALYSIS, MICROSCOPIC (REFLEX) - Abnormal; Notable for the following components:   Bacteria, UA MANY (*)    Non Squamous Epithelial PRESENT (*)    All other components within normal limits  URINE CULTURE  CBC WITH DIFFERENTIAL/PLATELET  LIPASE, BLOOD  PROTIME-INR    EKG EKG Interpretation  Date/Time:  Wednesday February 21 2020 12:02:37 EST Ventricular Rate:  80 PR Interval:    QRS Duration: 99 QT Interval:  386 QTC Calculation: 446 R Axis:   -44 Text Interpretation: Sinus rhythm Ventricular bigeminy Left axis deviation Probable anterior infarct, age indeterminate similar to Nov 2021 Confirmed by Sherwood Gambler [phone]) on 02/21/2020 4:36:02 PM   Radiology CT ABDOMEN PELVIS WO CONTRAST  Result Date: 02/21/2020 CLINICAL DATA:  79 year old female with epigastric pain. EXAM: CT ABDOMEN AND PELVIS WITHOUT CONTRAST TECHNIQUE: Multidetector CT imaging of the abdomen and pelvis was performed following the standard protocol without IV contrast. COMPARISON:  CT abdomen pelvis dated 05/20/2015. FINDINGS: Lower chest: Bibasilar linear atelectasis/scarring. The visualized lung bases are otherwise clear. There is coronary vascular calcification. No intra-abdominal free air or free fluid. Hepatobiliary: The liver is unremarkable. No intrahepatic biliary dilatation. Multiple stones noted in the gallbladder. No pericholecystic fluid or evidence of acute cholecystitis by CT. Pancreas: The pancreas is atrophic. There is a 14 x 14 mm rounded lesion in the distal pancreas (26/3 and 36/6) which  is not characterized. Several small hypodense lesions arising from the pancreas including a 1 cm exophytic lesion in the pancreatic tail (35/3) also not characterized but possibly a side branch IPMN. Similar findings were seen on CT dating back to 2017, likely representing indolent processes. No active inflammatory changes. Spleen: The spleen is unremarkable.  Adrenals/Urinary Tract: The adrenal glands are unremarkable. Small bilateral nonobstructing renal calculi measuring approximately 3 mm in the inferior pole of the right kidney. There is moderate bilateral renal parenchyma atrophy. There is mild fullness of the right renal pelvis and collecting system. There is no hydronephrosis on the left. Small bilateral renal hypodense lesions suboptimally characterized but appears similar to prior CT, likely cysts. The visualized ureters and urinary bladder appear unremarkable. Stomach/Bowel: Small hiatal hernia. There is moderate stool throughout the colon. There is no bowel obstruction or active inflammation. The appendix is normal. Vascular/Lymphatic: Advanced aortoiliac atherosclerotic disease. The IVC is unremarkable. No portal venous gas. There is no adenopathy. Reproductive: The uterus and ovaries are grossly unremarkable. Other: None Musculoskeletal: Advanced osteopenia with degenerative changes of the spine. Age indeterminate compression fracture of the inferior endplate of L1 with approximately 50% loss of vertebral body height, new since the prior CT. Correlation with clinical exam and point tenderness recommended. There is approximately 2 mm retropulsion of the posteroinferior cortex. Status post prior ORIF of bilateral femoral neck fractures. IMPRESSION: 1. Constipation.  No bowel obstruction.  Normal appendix. 2. Small hiatal hernia. 3. Small nonobstructing bilateral renal calculi. Minimal fullness of the right renal collecting system. 4. Cholelithiasis. 5. Pancreatic lesions as seen on the prior CT. 6. Age indeterminate compression fracture of the inferior endplate of L1 with approximately 50% loss of vertebral body height, new since the prior CT. Correlation with clinical exam and point tenderness recommended. 7. Aortic Atherosclerosis (ICD10-I70.0). Electronically Signed   By: Anner Crete M.D.   On: 02/21/2020 17:45   DG Chest 2 View  Result Date:  02/21/2020 CLINICAL DATA:  Hypoxia. EXAM: CHEST - 2 VIEW COMPARISON:  January 26, 2020. FINDINGS: The heart size and mediastinal contours are within normal limits. Aortic atherosclerosis. Both lungs are clear. No visible pleural effusions or pneumothorax. No acute osseous abnormality. Similar clips projecting in the right paratracheal region. IMPRESSION: No active cardiopulmonary disease. Electronically Signed   By: Margaretha Sheffield MD   On: 02/21/2020 13:36   CT Head Wo Contrast  Result Date: 02/21/2020 CLINICAL DATA:  Dizziness since yesterday, nausea and vomiting EXAM: CT HEAD WITHOUT CONTRAST TECHNIQUE: Contiguous axial images were obtained from the base of the skull through the vertex without intravenous contrast. COMPARISON:  01/26/2020 FINDINGS: Brain: Chronic encephalomalacia in the left MCA territory. Stable chronic small-vessel ischemic changes throughout the white matter. No acute infarct or hemorrhage. Lateral ventricles and midline structures are unremarkable. No acute extra-axial fluid collections. No mass effect. Vascular: No hyperdense vessel or unexpected calcification. Skull: Normal. Negative for fracture or focal lesion. Sinuses/Orbits: No acute finding. Other: None. IMPRESSION: 1. Chronic ischemic changes as above. No acute intracranial process. Electronically Signed   By: Randa Ngo M.D.   On: 02/21/2020 17:33    Procedures Procedures (including critical care time)  Medications Ordered in ED Medications  0.9 %  sodium chloride infusion ( Intravenous Stopped 02/21/20 1920)  cefdinir (OMNICEF) capsule 300 mg (300 mg Oral Not Given 02/21/20 1920)  sodium chloride 0.9 % bolus 500 mL (0 mLs Intravenous Stopped 02/21/20 1453)  ondansetron (ZOFRAN) injection 4 mg (4 mg Intravenous Given 02/21/20 1537)  potassium chloride 10 mEq in 100 mL IVPB (0 mEq Intravenous Stopped 02/21/20 1704)  meclizine (ANTIVERT) tablet 12.5 mg (12.5 mg Oral Given 02/21/20 1706)    ED Course  I  have reviewed the triage vital signs and the nursing notes.  Pertinent labs & imaging results that were available during my care of the patient were reviewed by me and considered in my medical decision making (see chart for details).    MDM Rules/Calculators/A&P                         79 year old female who presents with 1 day of dizziness, nausea, vomiting.  Differential diagnosis for this patient symptoms include but aren't limited to BPPV, vestibular neuritis, labyrinthitis, orthostatic hypotension, cerebellar stroke, metabolic derangement, urinary tract infection, hypoglycemia.   Vital signs were normal on intake, oxygen saturation 100% on room air.  Suspect previous hypoxic value was an error.  Cardiopulmonary exam is normal, limited neuro exam was normal.  Epigastric and right upper quadrant tenderness to palpation at the time of my exam.   We will proceed with basic laboratory studies and urinalysis, EKG, chest xray.  Antiemetics offered; fluid bolus administered.  CBC unremarkable.   CMP with mild hypokalemia 3.3; will replete IV while in the emergency department.  Additionally, BUN/creatinine elevated to 37/2.03, previously 25/1.77 (history of CKD).  UA with trace hgb, many bacteria, nonsquamous cells, urine culture pending.  Patient continues to experience dizziness and nausea.  Will administer Antivert at this time.  Case discussed with attending physician who also saw the patient at the bedside; proceed with CT head and abdomen pelvis at this time.  Due to patient's poor kidney function will proceed with noncontrast imaging studies.  CT head negative for acute intracranial abnormality; chronic ischemic changes.  CT abdomen pelvis with constipation, small hiatal hernia, nonobstructing bilateral renal calculi with minimal fullness of the right renal collecting system, cholelithiasis, age-indeterminate compression fracture of L1 with 50% loss of vertebral body height, aortic  atherosclerosis.  Patient was ambulated with the use of a walker (uses a walker at baseline), without difficulty.  Patient endorsed feeling steady on her feet, denied having any difficulty walking.  Endorses improvement in her dizziness after administration of Antivert.  Given reassuring physical exam, vital signs, imaging studies, and laboratory studies, no further work-up is warranted in emergency department this time.  Will treat as urinary tract infection and vertigo.  Recommend close follow-up with her primary care doctor.  [place] voiced understanding of her medical evaluation and treatment plan.  Her questions were answered to her expressed satisfaction.  Strict return precautions were given.  Patient is stable and appropriate for discharge at this time.  Final Clinical Impression(s) / ED Diagnoses Final diagnoses:  Lower urinary tract infectious disease  Vertigo    Rx / DC Orders ED Discharge Orders         Ordered    cefdinir (OMNICEF) 300 MG capsule  2 times daily        02/21/20 1906    meclizine (ANTIVERT) 12.5 MG tablet  2 times daily PRN        02/21/20 1906           Aura Dials 02/21/20 1959    Luna Fuse, MD 02/22/20 [phone]

## 2020-02-21 NOTE — Patient Instructions (Signed)
I already contacted the ER doctor, they will see you as soon as possible.

## 2020-02-21 NOTE — Assessment & Plan Note (Signed)
Weakness, dizziness. Here for routine follow-up, she however is not feeling well, she has hypoxia, on exam there is decreased breath sounds at the bases, worse on the left. She needs prompt evaluation, I communicated with the ER doctor about [name] [name], he agreed to see her.  Appreciate his help.

## 2020-02-21 NOTE — Progress Notes (Signed)
Subjective:    Patient ID: [name] [name], female    DOB: [birth year], 79 y.o.   MRN: [id number]  DOS:  02/21/2020 Type of visit - description: f/u   The patient presented for a routine follow-up. My nurse noted immediately that she was not feeling well. O2 sat 85%, BP 100/66. She is here with her husband, she started to feel unwell 2 days ago. She denies fever chills.  No chest pain or difficulty breathing. She states that she is simply feeling weak, dizzy and has some abdominal pain.     Review of Systems See above   Past Medical History:  Diagnosis Date  . Atrial fibrillation ([place])    SVT dx 2007, cath 2007 mild CAD, then had an cardioversion, ablation; still on coumadin , has occ palpitation, EKG 03-2010 NSR  . Blindness of left eye   . CKD (chronic kidney disease), stage III ([place]) 06/16/2016   [name] 06/17/2016  . DIABETES MELLITUS, TYPE II 03/27/2006   dr [name]  . Eye muscle weakness    Right eye weakness after cataract surgery  . GERD (gastroesophageal reflux disease) 10/05/2011  . 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 HYPERLIPIDEMIA 03/27/2006  . HYPERTENSION 03/27/2006  . Intertrochanteric fracture of right hip ([place]) 07/13/2012  . 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 Osteopenia 2004   Dexa 2004 showed Osteopenia, DEXA 03/2007 normal  . Osteopenia   . Other chronic cystitis with hematuria   . Pelvic fracture ([place]) 06/16/2016   S/P fall  . Recurrent urinary tract infection    Seeing Urology  . RETINOPATHY, BACKGROUND NOS 03/27/2006  . Seizures ([place])   . 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[place]) 10/10/9145    Past Surgical History:  Procedure Laterality Date  . ANKLE FRACTURE SURGERY Bilateral 08/2015   "right one was in 2 places"  . CARDIAC CATHETERIZATION N/A 05/17/2015   Procedure: Left Heart Cath and Coronary Angiography;  Surgeon: Jettie Booze, MD;  Location: [facility];  Service: Cardiovascular;  Laterality:  N/A;  . CARDIAC CATHETERIZATION N/A 05/17/2015   Procedure: Coronary Balloon Angioplasty;  Surgeon: Jettie Booze, MD;  Location: [facility];  Service: Cardiovascular;  Laterality: N/A;  . FEMUR IM NAIL Right 07/15/2012   Procedure: INTRAMEDULLARY (IM) NAIL HIP;  Surgeon: Johnny Bridge, MD;  Location: [facility];  Service: Orthopedics;  Laterality: Right;  . FEMUR IM NAIL Left 12/02/2015   Procedure: INTRAMEDULLARY (IM) NAIL FEMORAL;  Surgeon: [name], MD;  Location: [facility];  Service: Orthopedics;  Laterality: Left;  . FRACTURE SURGERY    . INCISION AND DRAINAGE HIP Left 01/03/2016   Procedure: IRRIGATION AND DEBRIDEMENT HIP;  Surgeon: [name], MD;  Location: [facility];  Service: Orthopedics;  Laterality: Left;  . SHOULDER SURGERY Bilateral    "don't remember which side"    Allergies as of 02/21/2020      Reactions   Procaine Hcl Anaphylaxis   Amoxicillin Itching   Did it involve swelling of the face/tongue/throat, SOB, or low BP? No Did it involve sudden or severe rash/hives, skin peeling, or any reaction on the inside of your mouth or nose? No Did you need to seek medical attention at a hospital or doctor's office? No When did it last happen?unk If all above answers are "NO", may proceed with cephalosporin use.      Medication List  Accurate as of February 21, 2020  5:25 PM. If you have any questions, ask your nurse or doctor.        alendronate 70 MG tablet Commonly known as: FOSAMAX Take 1 tablet (70 mg total) by mouth every 7 (seven) days. Take with a full glass of water on an empty stomach.   aspirin EC 81 MG tablet Take 1 tablet (81 mg total) by mouth daily.   atorvastatin 80 MG tablet Commonly known as: LIPITOR Take 1 tablet by mouth once daily   augmented betamethasone dipropionate 0.05 % cream Commonly known as: DIPROLENE-AF Apply topically 2 (two) times daily.   calcium-vitamin D 500-200 MG-UNIT tablet Commonly known as: OSCAL WITH D Take  1 tablet by mouth daily.   cephALEXin 500 MG capsule Commonly known as: KEFLEX Take 1 capsule (500 mg total) by mouth 4 (four) times daily.   cetirizine 10 MG tablet Commonly known as: ZYRTEC Take 1 tablet (10 mg total) by mouth daily.   fluticasone 50 MCG/ACT nasal spray Commonly known as: FLONASE Place 2 sprays into both nostrils daily.   furosemide 40 MG tablet Commonly known as: LASIX Take 1 tablet (40 mg total) by mouth daily.   MULTIVITAMIN ADULT PO Take 1 tablet by mouth daily with breakfast.   NovoLIN 70/30 ReliOn (70-30) 100 UNIT/ML injection Generic drug: insulin NPH-regular Human 21 units with breakfast and 5 units with the evening meal.   ondansetron 4 MG disintegrating tablet Commonly known as: Zofran ODT Take 1 tablet (4 mg total) by mouth every 8 (eight) hours as needed for nausea or vomiting.   warfarin 2.5 MG tablet Commonly known as: COUMADIN Take as directed by the anticoagulation clinic. If you are unsure how to take this medication, talk to your nurse or doctor. Original instructions: TAKE AS DIRECTED PER COUMADIN CLINIC          Objective:   Physical Exam BP 100/66 (BP Location: Left Arm, Patient Position: Sitting, Cuff Size: Large)   Pulse 66   Ht 5\' 9"  (1.753 m)   SpO2 (!) 88%   BMI 24.96 kg/m  General:   Well developed, looks very tired, nontoxic appearing.  Sitting on a wheelchair HEENT:  Normocephalic . Face symmetric, atraumatic Lungs:  Decreased breath sounds at bases, worse on the left.  No crackles, no wheezing.  Heart: RRR,  no murmur.  Abdomen:  Not distended, soft, non-tender.  Skin: Not pale. Not jaundice Lower extremities: no pretibial edema bilaterally  Neurologic:  alert & oriented X3.  Speech normal, gait not tested Psych--  No confusion, response questions appropriately Behavior appropriate. No anxious or depressed appearing.     Assessment     Assessment   DM  + retinopathy, Dr [name] HTN CKD:   -Creatinine 1.5 = GFR ~35 -Elevated creatinine(started after admission 05-2015, had IV contrast) Hyperlipidemia anxiety ativan GERD Anemia, normal iron 05-2015, never had a colonoscopy CV ---NSTEMI 05-2015, cath, angiplasty ---Echo, EF 40 % (05-2015) ---Atrial fibrillation, SVT -- dx 2007, cath 2007 mild CAD. 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 Here  for routine follow-up, she however is not feeling well, she has hypoxia, on exam there is decreased breath sounds at the bases, worse on the left. She needs prompt evaluation, I communicated with the ER doctor about Ms. [place], he agreed to see her.  Appreciate his help.      This visit occurred during the SARS-CoV-2 public health emergency.  Safety protocols were in place, including screening questions prior to the visit, additional usage of staff PPE, and extensive cleaning of exam room while observing appropriate contact time as indicated for disinfecting solutions.

## 2020-02-21 NOTE — ED Triage Notes (Addendum)
Pt c/o abd pain, n/v, dizziness started yesterday-states she had same sx the day after Thanksgiving and states she was seen here for c/o-NAD-to triage in w/c-pt was seen by PCPtoday and sent to ED

## 2020-02-21 NOTE — ED Notes (Signed)
Pt stated she is unable to give a urine sample at this time, RN Jenny Reichmann was informed

## 2020-02-24 LAB — URINE CULTURE: Culture: >=100000 — AB

## 2020-02-25 NOTE — Progress Notes (Signed)
ED Antimicrobial Stewardship Positive Culture Follow Up   [place] [name] [name] is an 79 y.o. female who presented to [facility] on 02/21/2020 with a chief complaint of unsteadiness, N/V and headache x 1 day.  Chief Complaint  Patient presents with  . Abdominal Pain    Recent Results (from the past 720 hour(s))  Urine culture     Status: Abnormal   Collection Time: 02/21/20  3:27 PM   Specimen: Urine, Random  Result Value Ref Range Status   Specimen Description   Final    URINE, RANDOM Performed at [facility], [address] [place], [place] [address]    Special Requests   Final    NONE Performed at [facility], [address] [place], [place] [address]    Culture (A)  Final    >=100,000 COLONIES/mL ESCHERICHIA COLI >=100,000 COLONIES/mL KLEBSIELLA PNEUMONIAE Confirmed Extended Spectrum Beta-Lactamase Producer (ESBL).  In bloodstream infections from ESBL organisms, carbapenems are preferred over piperacillin/tazobactam. They are shown to have a lower risk of mortality.    Report Status 02/24/2020 FINAL  Final   Organism ID, Bacteria ESCHERICHIA COLI (A)  Final   Organism ID, Bacteria KLEBSIELLA PNEUMONIAE (A)  Final      Susceptibility   Escherichia coli - MIC*    AMPICILLIN >=32 RESISTANT Resistant     CEFAZOLIN >=64 RESISTANT Resistant     CEFEPIME 16 RESISTANT Resistant     CEFTRIAXONE >=64 RESISTANT Resistant     CIPROFLOXACIN <=0.25 SENSITIVE Sensitive     GENTAMICIN <=1 SENSITIVE Sensitive     IMIPENEM <=0.25 SENSITIVE Sensitive     NITROFURANTOIN <=16 SENSITIVE Sensitive     TRIMETH/SULFA <=20 SENSITIVE Sensitive     AMPICILLIN/SULBACTAM 4 SENSITIVE Sensitive     PIP/TAZO <=4 SENSITIVE Sensitive     * >=100,000 COLONIES/mL ESCHERICHIA COLI   Klebsiella pneumoniae - MIC*    AMPICILLIN >=32 RESISTANT Resistant     CEFAZOLIN >=64 RESISTANT Resistant     CEFEPIME >=32 RESISTANT Resistant     CEFTRIAXONE >=64 RESISTANT Resistant      CIPROFLOXACIN >=4 RESISTANT Resistant     GENTAMICIN >=16 RESISTANT Resistant     IMIPENEM 0.5 SENSITIVE Sensitive     NITROFURANTOIN 64 INTERMEDIATE Intermediate     TRIMETH/SULFA <=20 SENSITIVE Sensitive     AMPICILLIN/SULBACTAM >=32 RESISTANT Resistant     PIP/TAZO 64 INTERMEDIATE Intermediate     * >=100,000 COLONIES/mL KLEBSIELLA PNEUMONIAE    [x]  Treated with cefdinir, organism resistant to prescribed antimicrobial   Stop cefdinir. Start Fosfomycin 3 gm x 1 dose. If symptoms persist after taking fosfomycin, patient placement to inform patient to please return to the ED.   ED Provider: Silverio Decamp, PA-C   Albertina Parr, PharmD., BCPS, BCCCP Clinical Pharmacist Please refer to [facility] for unit-specific pharmacist

## 2020-02-26 ENCOUNTER — Ambulatory Visit (INDEPENDENT_AMBULATORY_CARE_PROVIDER_SITE_OTHER): Payer: Medicare Other | Admitting: *Deleted

## 2020-02-26 ENCOUNTER — Other Ambulatory Visit: Payer: Self-pay

## 2020-02-26 DIAGNOSIS — Z5181 Encounter for therapeutic drug level monitoring: Secondary | ICD-10-CM

## 2020-02-26 DIAGNOSIS — I480 Paroxysmal atrial fibrillation: Secondary | ICD-10-CM

## 2020-02-26 DIAGNOSIS — I482 Chronic atrial fibrillation, unspecified: Secondary | ICD-10-CM

## 2020-02-26 LAB — POCT INR: INR: 2.2 (ref 2.0–3.0)

## 2020-02-26 NOTE — Patient Instructions (Signed)
Description   Continue taking Warfarin 1 tablet daily except for 1/2 tablet on Mondays and Fridays. Recheck INR in 3 weeks. Call us with any medication changes [phone] to Coumadin Clinic.

## 2020-02-27 ENCOUNTER — Other Ambulatory Visit: Payer: Self-pay

## 2020-02-27 ENCOUNTER — Ambulatory Visit (INDEPENDENT_AMBULATORY_CARE_PROVIDER_SITE_OTHER): Payer: Medicare Other | Admitting: Internal Medicine

## 2020-02-27 VITALS — BP 147/84 | HR 74 | Temp 97.6°F | Ht 69.0 in | Wt 167.0 lb

## 2020-02-27 DIAGNOSIS — R42 Dizziness and giddiness: Secondary | ICD-10-CM | POA: Diagnosis not present

## 2020-02-27 DIAGNOSIS — N390 Urinary tract infection, site not specified: Secondary | ICD-10-CM

## 2020-02-27 DIAGNOSIS — I10 Essential (primary) hypertension: Secondary | ICD-10-CM | POA: Diagnosis not present

## 2020-02-27 DIAGNOSIS — I255 Ischemic cardiomyopathy: Secondary | ICD-10-CM

## 2020-02-27 DIAGNOSIS — R531 Weakness: Secondary | ICD-10-CM | POA: Diagnosis not present

## 2020-02-27 NOTE — Assessment & Plan Note (Signed)
Weakness, dizziness. See last visit, was evaluated at the ER, extensive chart review, see summary above. She was found to have a UTI with E. coli and Klebsiella, never had urinary symptoms and continue to be asx.  Unclear if UTI is triggering the number of symptoms she had (interestingly she presented to the ER 01/26/2020 with multiple symptoms and was also found to have a UTI).   Plan to recheck a urine culture in 2 to 3 weeks, if infection persists consider a round of Bactrim which will take care of the E. coli and Klebsiella Plan: Finish antibiotics, recheck a UA urine culture in 2 to 3 weeks. UTI: See above HTN: Last creatinine slightly elevated, recheck in 2 to 3 weeks, patient is drinking plenty of fluids. RTC for CMP, UA urine culture to 3 weeks RTC office visit 4 months

## 2020-02-27 NOTE — Progress Notes (Signed)
Subjective:    Patient ID: [name] [name], female    DOB: 1941-02-17, 79 y.o.   MRN: [id number]  DOS:  02/27/2020 Type of visit - description: ER follow-up The patient was briefly seen here 02/21/2020, was noted to be extremely weak and hypoxic.  Was referred to the ER. Over there she reported dizziness unsteadiness, nausea, vomiting.  Also headache. No abdominal pain, diarrhea. Upon arrival to the ER, vital signs were stable, O2 sat was 100%. Work-up included a CBC, chest x-ray, CMP, lipase, urine culture CT head (not acute).  CT abdomen: constipation without obstruction.  Chronic pancreatic lesions.  Age indeterminate L1 fracture.   U CX: E. coli, Klebsiella.  Potassium 3.3, creatinine 2.03    Review of Systems After the ER visit she quickly felt better. Currently at baseline, asymptomatic. No constipation No LUTS No dizziness, weakness.   Past Medical History:  Diagnosis Date  . Atrial fibrillation ([place])    SVT dx 2007, cath 2007 mild CAD, then had an cardioversion, ablation; still on coumadin , has occ palpitation, EKG 03-2010 NSR  . Blindness of left eye   . CKD (chronic kidney disease), stage III ([place]) 06/16/2016   [name] 06/17/2016  . DIABETES MELLITUS, TYPE II 03/27/2006   dr [name]  . Eye muscle weakness    Right eye weakness after cataract surgery  . GERD (gastroesophageal reflux disease) 10/05/2011  . 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 HYPERLIPIDEMIA 03/27/2006  . HYPERTENSION 03/27/2006  . Intertrochanteric fracture of right hip ([place]) 07/13/2012  . 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 Osteopenia 2004   Dexa 2004 showed Osteopenia, DEXA 03/2007 normal  . Osteopenia   . Other chronic cystitis with hematuria   . Pelvic fracture ([place]) 06/16/2016   S/P fall  . Recurrent urinary tract infection    Seeing Urology  . RETINOPATHY, BACKGROUND NOS 03/27/2006  . Seizures ([place])   . 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[place]) 4/70/9628     Past Surgical History:  Procedure Laterality Date  . ANKLE FRACTURE SURGERY Bilateral 08/2015   "right one was in 2 places"  . CARDIAC CATHETERIZATION N/A 05/17/2015   Procedure: Left Heart Cath and Coronary Angiography;  Surgeon: Jettie Booze, MD;  Location: [facility];  Service: Cardiovascular;  Laterality: N/A;  . CARDIAC CATHETERIZATION N/A 05/17/2015   Procedure: Coronary Balloon Angioplasty;  Surgeon: Jettie Booze, MD;  Location: [facility];  Service: Cardiovascular;  Laterality: N/A;  . FEMUR IM NAIL Right 07/15/2012   Procedure: INTRAMEDULLARY (IM) NAIL HIP;  Surgeon: Johnny Bridge, MD;  Location: [facility];  Service: Orthopedics;  Laterality: Right;  . FEMUR IM NAIL Left 12/02/2015   Procedure: INTRAMEDULLARY (IM) NAIL FEMORAL;  Surgeon: [name], MD;  Location: [place] [facility];  Service: Orthopedics;  Laterality: Left;  . FRACTURE SURGERY    . INCISION AND DRAINAGE HIP Left 01/03/2016   Procedure: IRRIGATION AND DEBRIDEMENT HIP;  Surgeon: [name], MD;  Location: [facility];  Service: Orthopedics;  Laterality: Left;  . SHOULDER SURGERY Bilateral    "don't remember which side"    Allergies as of 02/27/2020      Reactions   Procaine Hcl Anaphylaxis   Amoxicillin Itching   Did it involve swelling of the face/tongue/throat, SOB, or low BP? No Did it involve sudden or severe rash/hives, skin peeling, or any reaction on the inside of your mouth or  nose? No Did you need to seek medical attention at a hospital or doctor's office? No When did it last happen?unk If all above answers are "NO", may proceed with cephalosporin use.      Medication List       Accurate as of February 27, 2020  8:21 PM. If you have any questions, ask your nurse or doctor.        alendronate 70 MG tablet Commonly known as: FOSAMAX Take 1 tablet (70 mg total) by mouth every 7 (seven) days. Take with a full glass of water on an empty stomach.   aspirin EC 81 MG tablet Take 1  tablet (81 mg total) by mouth daily.   atorvastatin 80 MG tablet Commonly known as: LIPITOR Take 1 tablet by mouth once daily   augmented betamethasone dipropionate 0.05 % cream Commonly known as: DIPROLENE-AF Apply topically 2 (two) times daily.   calcium-vitamin D 500-200 MG-UNIT tablet Commonly known as: OSCAL WITH D Take 1 tablet by mouth daily.   cefdinir 300 MG capsule Commonly known as: OMNICEF Take 1 capsule (300 mg total) by mouth 2 (two) times daily for 7 days.   cetirizine 10 MG tablet Commonly known as: ZYRTEC Take 1 tablet (10 mg total) by mouth daily.   fluticasone 50 MCG/ACT nasal spray Commonly known as: FLONASE Place 2 sprays into both nostrils daily.   furosemide 40 MG tablet Commonly known as: LASIX Take 1 tablet (40 mg total) by mouth daily.   meclizine 12.5 MG tablet Commonly known as: ANTIVERT Take 1 tablet (12.5 mg total) by mouth 2 (two) times daily as needed for dizziness.   MULTIVITAMIN ADULT PO Take 1 tablet by mouth daily with breakfast.   NovoLIN 70/30 ReliOn (70-30) 100 UNIT/ML injection Generic drug: insulin NPH-regular Human 21 units with breakfast and 5 units with the evening meal.   ondansetron 4 MG disintegrating tablet Commonly known as: Zofran ODT Take 1 tablet (4 mg total) by mouth every 8 (eight) hours as needed for nausea or vomiting.   warfarin 2.5 MG tablet Commonly known as: COUMADIN Take as directed by the anticoagulation clinic. If you are unsure how to take this medication, talk to your nurse or doctor. Original instructions: TAKE AS DIRECTED PER COUMADIN CLINIC          Objective:   Physical Exam BP (!) 147/84 (BP Location: Left Arm, Patient Position: Sitting, Cuff Size: Large)   Pulse 74   Temp 97.6 F (36.4 C) (Oral)   Ht 5\' 9"  (1.753 m)   Wt 167 lb (75.8 kg)   SpO2 98%   BMI 24.66 kg/m  General:   Well developed, NAD, BMI noted.  HEENT:  Normocephalic . Face symmetric, atraumatic Lungs:  CTA  B Normal respiratory effort, no intercostal retractions, no accessory muscle use. Heart: Irregularly irregular Abdomen:  Not distended, soft, non-tender. No rebound or rigidity.   Skin: Not pale. Not jaundice Lower extremities: no pretibial edema bilaterally  Neurologic:  alert & oriented X3.  Speech normal, gait assisted by a roller Psych--  Cognition and judgment appear intact.  Cooperative with normal attention span and concentration.  Behavior appropriate. No anxious or depressed appearing.     Assessment      Assessment   DM  + retinopathy, Dr [name] HTN CKD:  -Creatinine 1.5 = GFR ~35 -Elevated creatinine(started after admission 05-2015, had IV contrast) Hyperlipidemia anxiety ativan GERD Anemia, normal iron 05-2015, never had a colonoscopy CV ---NSTEMI 05-2015, cath, angiplasty ---Echo, EF  40 % (05-2015) ---Atrial fibrillation, SVT -- dx 2007, cath 2007 mild CAD. 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 See last visit, was evaluated at the ER, extensive chart review, see summary above. She was found to have a UTI with E. coli and Klebsiella, never had urinary symptoms and continue to be asx.  Unclear if UTI is triggering the number of symptoms she had (interestingly she presented to the ER 01/26/2020 with multiple symptoms and was also found to have a UTI).   Plan to recheck a urine culture in 2 to 3 weeks, if infection persists consider a round of Bactrim which will take care of the E. coli and  Klebsiella Plan: Finish antibiotics, recheck a UA urine culture in 2 to 3 weeks. UTI: See above HTN: Last creatinine slightly elevated, recheck in 2 to 3 weeks, patient is drinking plenty of fluids. RTC for CMP, UA urine culture to 3 weeks RTC office visit 4 months  This visit occurred during the SARS-CoV-2 public health emergency.  Safety protocols were in place, including screening questions prior to the visit, additional usage of staff PPE, and extensive cleaning of exam room while observing appropriate contact time as indicated for disinfecting solutions.

## 2020-02-27 NOTE — Patient Instructions (Addendum)
Finish antibiotics as prescribed  Let me know if you have any more symptoms   GO TO THE FRONT DESK, [place] Come back for blood work and a urine test in 2 to 3 weeks, please make a lab appointment  Come back for a checkup with me in 3 to 4 months

## 2020-03-14 ENCOUNTER — Other Ambulatory Visit (INDEPENDENT_AMBULATORY_CARE_PROVIDER_SITE_OTHER): Payer: Medicare Other

## 2020-03-14 ENCOUNTER — Other Ambulatory Visit: Payer: Self-pay

## 2020-03-14 DIAGNOSIS — I10 Essential (primary) hypertension: Secondary | ICD-10-CM | POA: Diagnosis not present

## 2020-03-14 DIAGNOSIS — N390 Urinary tract infection, site not specified: Secondary | ICD-10-CM | POA: Diagnosis not present

## 2020-03-14 LAB — COMPREHENSIVE METABOLIC PANEL
ALT: 13 U/L (ref 0–35)
AST: 21 U/L (ref 0–37)
Albumin: 3.5 g/dL (ref 3.5–5.2)
Alkaline Phosphatase: 83 U/L (ref 39–117)
BUN: 31 mg/dL — ABNORMAL HIGH (ref 6–23)
CO2: 30 mEq/L (ref 19–32)
Calcium: 9.1 mg/dL (ref 8.4–10.5)
Chloride: 98 mEq/L (ref 96–112)
Creatinine, Ser: 1.99 mg/dL — ABNORMAL HIGH (ref 0.40–1.20)
GFR: 23.67 mL/min — ABNORMAL LOW (ref >60.00)
Glucose, Bld: 259 mg/dL — ABNORMAL HIGH (ref 70–99)
Potassium: 4.4 mEq/L (ref 3.5–5.1)
Sodium: 136 mEq/L (ref 135–145)
Total Bilirubin: 0.8 mg/dL (ref 0.2–1.2)
Total Protein: 6.5 g/dL (ref 6.0–8.3)

## 2020-03-15 ENCOUNTER — Other Ambulatory Visit: Payer: Medicare Other

## 2020-03-15 LAB — URINALYSIS, ROUTINE W REFLEX MICROSCOPIC
Bilirubin Urine: NEGATIVE
Ketones, ur: NEGATIVE
Nitrite: POSITIVE — AB
RBC / HPF: NONE SEEN (ref 0–?)
Specific Gravity, Urine: 1.02 (ref 1.000–1.030)
Total Protein, Urine: 100 — AB
Urine Glucose: NEGATIVE
Urobilinogen, UA: 0.2 — AB (ref 0.0–1.0)
pH: 8.5 — AB (ref 5.0–8.0)

## 2020-03-17 LAB — URINE CULTURE
MICRO NUMBER:: 11419924
SPECIMEN QUALITY:: ADEQUATE

## 2020-03-26 ENCOUNTER — Ambulatory Visit (INDEPENDENT_AMBULATORY_CARE_PROVIDER_SITE_OTHER): Payer: Medicare Other | Admitting: Pharmacist

## 2020-03-26 ENCOUNTER — Other Ambulatory Visit: Payer: Self-pay

## 2020-03-26 DIAGNOSIS — Z5181 Encounter for therapeutic drug level monitoring: Secondary | ICD-10-CM | POA: Diagnosis not present

## 2020-03-26 DIAGNOSIS — I480 Paroxysmal atrial fibrillation: Secondary | ICD-10-CM

## 2020-03-26 DIAGNOSIS — I482 Chronic atrial fibrillation, unspecified: Secondary | ICD-10-CM | POA: Diagnosis not present

## 2020-03-26 LAB — POCT INR: INR: 2.6 (ref 2.0–3.0)

## 2020-03-26 NOTE — Patient Instructions (Signed)
Description   Continue taking Warfarin 1 tablet daily except for 1/2 tablet on Mondays and Fridays. Recheck INR in 4 weeks. Call us with any medication changes [phone] to Coumadin Clinic.

## 2020-04-09 ENCOUNTER — Encounter: Payer: Self-pay | Admitting: Internal Medicine

## 2020-04-09 ENCOUNTER — Ambulatory Visit (INDEPENDENT_AMBULATORY_CARE_PROVIDER_SITE_OTHER): Payer: Medicare Other | Admitting: Internal Medicine

## 2020-04-09 ENCOUNTER — Other Ambulatory Visit: Payer: Self-pay

## 2020-04-09 VITALS — BP 127/78 | HR 75 | Temp 97.5°F | Ht 69.0 in | Wt 166.0 lb

## 2020-04-09 DIAGNOSIS — N184 Chronic kidney disease, stage 4 (severe): Secondary | ICD-10-CM

## 2020-04-09 DIAGNOSIS — M8000XD Age-related osteoporosis with current pathological fracture, unspecified site, subsequent encounter for fracture with routine healing: Secondary | ICD-10-CM

## 2020-04-09 DIAGNOSIS — I10 Essential (primary) hypertension: Secondary | ICD-10-CM

## 2020-04-09 MED ORDER — ALENDRONATE SODIUM 35 MG PO TABS: 35.0000 mg | ORAL_TABLET | ORAL | 3 refills | Status: DC

## 2020-04-09 NOTE — Progress Notes (Signed)
Subjective:    Patient ID: [name] [name], female    DOB: 1940/10/21, 80 y.o.   MRN: [id number]  DOS:  04/09/2020 Type of visit - description: Follow-up  Since the last visit, she is doing better. Denies any dizziness, weakness improved.  Review of Systems Denies chest pain or difficulty breathing. No edema No LUTS  Past Medical History:  Diagnosis Date  . Atrial fibrillation ([place])    SVT dx 2007, cath 2007 mild CAD, then had an cardioversion, ablation; still on coumadin , has occ palpitation, EKG 03-2010 NSR  . Blindness of left eye   . CKD (chronic kidney disease), stage III ([place]) 06/16/2016   [name] 06/17/2016  . DIABETES MELLITUS, TYPE II 03/27/2006   dr [name]  . Eye muscle weakness    Right eye weakness after cataract surgery  . GERD (gastroesophageal reflux disease) 10/05/2011  . 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 HYPERLIPIDEMIA 03/27/2006  . HYPERTENSION 03/27/2006  . Intertrochanteric fracture of right hip ([place]) 07/13/2012  . 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 Osteopenia 2004   Dexa 2004 showed Osteopenia, DEXA 03/2007 normal  . Osteopenia   . Other chronic cystitis with hematuria   . Pelvic fracture ([place]) 06/16/2016   S/P fall  . Recurrent urinary tract infection    Seeing Urology  . RETINOPATHY, BACKGROUND NOS 03/27/2006  . Seizures ([place])   . 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[place]) Q000111Q    Past Surgical History:  Procedure Laterality Date  . ANKLE FRACTURE SURGERY Bilateral 08/2015   "right one was in 2 places"  . CARDIAC CATHETERIZATION N/A 05/17/2015   Procedure: Left Heart Cath and Coronary Angiography;  Surgeon: Jettie Booze, MD;  Location: [facility];  Service: Cardiovascular;  Laterality: N/A;  . CARDIAC CATHETERIZATION N/A 05/17/2015   Procedure: Coronary Balloon Angioplasty;  Surgeon: Jettie Booze, MD;  Location: [facility];  Service: Cardiovascular;  Laterality: N/A;  . FEMUR IM  NAIL Right 07/15/2012   Procedure: INTRAMEDULLARY (IM) NAIL HIP;  Surgeon: Johnny Bridge, MD;  Location: [facility];  Service: Orthopedics;  Laterality: Right;  . FEMUR IM NAIL Left 12/02/2015   Procedure: INTRAMEDULLARY (IM) NAIL FEMORAL;  Surgeon: [name], MD;  Location: [facility];  Service: Orthopedics;  Laterality: Left;  . FRACTURE SURGERY    . INCISION AND DRAINAGE HIP Left 01/03/2016   Procedure: IRRIGATION AND DEBRIDEMENT HIP;  Surgeon: [name], MD;  Location: [facility];  Service: Orthopedics;  Laterality: Left;  . SHOULDER SURGERY Bilateral    "don't remember which side"    Allergies as of 04/09/2020      Reactions   Procaine Hcl Anaphylaxis   Amoxicillin Itching   Did it involve swelling of the face/tongue/throat, SOB, or low BP? No Did it involve sudden or severe rash/hives, skin peeling, or any reaction on the inside of your mouth or nose? No Did you need to seek medical attention at a hospital or doctor's office? No When did it last happen?unk If all above answers are "NO", may proceed with cephalosporin use.      Medication List       Accurate as of April 09, 2020 11:59 PM. If you have any questions, ask your nurse or doctor.        STOP taking these medications   meclizine 12.5 MG tablet Commonly known as: ANTIVERT Stopped by: [name], MD  ondansetron 4 MG disintegrating tablet Commonly known as: Zofran ODT Stopped by: [name], MD     TAKE these medications   alendronate 35 MG tablet Commonly known as: FOSAMAX Take 1 tablet (35 mg total) by mouth every 7 (seven) days. Take with a full glass of water on an empty stomach. What changed:   medication strength  how much to take Changed by: [name], MD   aspirin EC 81 MG tablet Take 1 tablet (81 mg total) by mouth daily.   atorvastatin 80 MG tablet Commonly known as: LIPITOR Take 1 tablet by mouth once daily   augmented betamethasone dipropionate 0.05 % cream Commonly known as: DIPROLENE-AF Apply  topically 2 (two) times daily.   calcium-vitamin D 500-200 MG-UNIT tablet Commonly known as: OSCAL WITH D Take 1 tablet by mouth daily.   cetirizine 10 MG tablet Commonly known as: ZYRTEC Take 1 tablet (10 mg total) by mouth daily.   fluticasone 50 MCG/ACT nasal spray Commonly known as: FLONASE Place 2 sprays into both nostrils daily.   furosemide 40 MG tablet Commonly known as: LASIX Take 1 tablet (40 mg total) by mouth daily.   MULTIVITAMIN ADULT PO Take 1 tablet by mouth daily with breakfast.   NovoLIN 70/30 ReliOn (70-30) 100 UNIT/ML injection Generic drug: insulin NPH-regular Human 21 units with breakfast and 5 units with the evening meal.   warfarin 2.5 MG tablet Commonly known as: COUMADIN Take as directed by the anticoagulation clinic. If you are unsure how to take this medication, talk to your nurse or doctor. Original instructions: TAKE AS DIRECTED PER COUMADIN CLINIC          Objective:   Physical Exam BP 127/78 (BP Location: Right Arm, Patient Position: Sitting, Cuff Size: Large)   Pulse 75   Temp (!) 97.5 F (36.4 C) (Oral)   Ht '5\' 9"'$  (1.753 m)   Wt 166 lb (75.3 kg)   SpO2 100%   BMI 24.51 kg/m  General:   Well developed, NAD, BMI noted. HEENT:  Normocephalic . Face symmetric, atraumatic Lungs:  CTA B Normal respiratory effort, no intercostal retractions, no accessory muscle use. Heart: Irregular Lower extremities: no pretibial edema bilaterally  Skin: Not pale. Not jaundice Neurologic:  alert & oriented X3.  Speech normal, gait: Assisted by walker Psych--  Cognition and judgment appear intact.  Cooperative with normal attention span and concentration.  Behavior appropriate. No anxious or depressed appearing.      Assessment     Assessment   DM  + retinopathy, Dr [name] HTN CKD:  -Creatinine 1.5 = GFR ~35 -Elevated creatinine(started after admission 05-2015, had IV contrast) Hyperlipidemia anxiety ativan GERD Anemia, normal iron  05-2015, never had a colonoscopy CV ---NSTEMI 05-2015, cath, angiplasty ---Echo, EF 40 % (05-2015) ---Atrial fibrillation, SVT -- dx 2007, cath 2007 mild CAD. 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  BP today is very good, continue Lasix. CKD: Creatinine ranged from 1.5-2.0, last kidney imaging was a CT December 2021: Bilateral parenchyma atrophy, no obstruction. Creatinine clearance is 29. Has not seen nephrology before, will arrange a referral. Hyperlipidemia: On Lipitor, not fasting today, recheck at the next opportunity Osteoporosis: Alendronate, will decrease dose to 35 mg weekly, due to decreased kidney function. UTI: Since the last office visit I urine culture was negative. RTC 4 months   This visit occurred during the SARS-CoV-2 public health emergency.  Safety protocols were in place, including screening questions prior to the visit, additional usage of staff PPE, and extensive cleaning of exam room while observing appropriate contact time as indicated for disinfecting solutions.

## 2020-04-09 NOTE — Patient Instructions (Addendum)
Per our records you are due for an eye exam. Please contact your eye doctor to schedule an appointment. Please have them send copies of your office visit notes to [place]. Our fax number is [phone].   Decrease the dose of alendronate (Fosamax) to only 35 mg once weekly, continue the same precautions.   GO TO THE FRONT DESK, PLEASE SCHEDULE YOUR APPOINTMENTS Come back for a checkup in 4 to 5 months

## 2020-04-10 DIAGNOSIS — N184 Chronic kidney disease, stage 4 (severe): Secondary | ICD-10-CM | POA: Insufficient documentation

## 2020-04-10 NOTE — Assessment & Plan Note (Addendum)
DM: Per Endo, has an appointment soon HTN: Since the last visit, creatinine was recheck: 1.99 noting that over the last year it has ranged from 1.5-1.9 or even 2.0.  BP today is very good, continue Lasix. CKD: Creatinine ranged from 1.5-2.0, last kidney imaging was a CT December 2021: Bilateral parenchyma atrophy, no obstruction. Creatinine clearance is 29. Has not seen nephrology before, will arrange a referral. Hyperlipidemia: On Lipitor, not fasting today, recheck at the next opportunity Osteoporosis: Alendronate, will decrease dose to 35 mg weekly, due to decreased kidney function. UTI: Since the last office visit I urine culture was negative. RTC 4 months

## 2020-04-11 ENCOUNTER — Ambulatory Visit: Payer: BLUE CROSS/BLUE SHIELD | Admitting: Cardiovascular Disease

## 2020-04-17 ENCOUNTER — Ambulatory Visit: Payer: BLUE CROSS/BLUE SHIELD | Admitting: Endocrinology

## 2020-04-19 ENCOUNTER — Ambulatory Visit: Payer: BLUE CROSS/BLUE SHIELD | Admitting: Cardiovascular Disease

## 2020-04-23 ENCOUNTER — Other Ambulatory Visit: Payer: Self-pay

## 2020-04-23 ENCOUNTER — Ambulatory Visit (INDEPENDENT_AMBULATORY_CARE_PROVIDER_SITE_OTHER): Payer: Medicare Other | Admitting: *Deleted

## 2020-04-23 DIAGNOSIS — Z5181 Encounter for therapeutic drug level monitoring: Secondary | ICD-10-CM

## 2020-04-23 DIAGNOSIS — I480 Paroxysmal atrial fibrillation: Secondary | ICD-10-CM | POA: Diagnosis not present

## 2020-04-23 DIAGNOSIS — I482 Chronic atrial fibrillation, unspecified: Secondary | ICD-10-CM | POA: Diagnosis not present

## 2020-04-23 LAB — POCT INR: INR: 1.9 — AB (ref 2.0–3.0)

## 2020-04-23 NOTE — Patient Instructions (Signed)
Description   Today take 1.5 tablets then continue taking Warfarin 1 tablet daily except for 1/2 tablet on Mondays and Fridays. Recheck INR in 4 weeks. Call us with any medication changes [phone] to Coumadin Clinic.

## 2020-05-02 LAB — BASIC METABOLIC PANEL WITH GFR
BUN: 28 — AB (ref 4–21)
CO2: 27 — AB (ref 13–22)
Chloride: 101 (ref 99–108)
Creatinine: 1.7 — AB (ref 0.5–1.1)
Glucose: 102
Potassium: 4.7 (ref 3.4–5.3)
Sodium: 140 (ref 137–147)

## 2020-05-02 LAB — COMPREHENSIVE METABOLIC PANEL WITH GFR
Albumin: 3.8 (ref 3.5–5.0)
Calcium: 9.8 (ref 8.7–10.7)
GFR calc non Af Amer: 32

## 2020-05-02 LAB — IRON,TIBC AND FERRITIN PANEL
%SAT: 28
Ferritin: 73
Iron: 83
TIBC: 298
UIBC: 215

## 2020-05-02 LAB — HEPATIC FUNCTION PANEL
ALT: 11 (ref 7–35)
AST: 25 (ref 13–35)
Alkaline Phosphatase: 103 (ref 25–125)
Bilirubin, Total: 0.4

## 2020-05-02 LAB — VITAMIN D 25 HYDROXY (VIT D DEFICIENCY, FRACTURES): Vit D, 25-Hydroxy: 68.3

## 2020-05-02 LAB — CBC AND DIFFERENTIAL
HCT: 38 (ref 36–46)
Hemoglobin: 12.7 (ref 12.0–16.0)
Platelets: 353 (ref 150–399)
WBC: 6.5

## 2020-05-02 LAB — CBC: RBC: 4.2 (ref 3.87–5.11)

## 2020-05-16 ENCOUNTER — Other Ambulatory Visit: Payer: Self-pay | Admitting: Internal Medicine

## 2020-05-21 ENCOUNTER — Ambulatory Visit (INDEPENDENT_AMBULATORY_CARE_PROVIDER_SITE_OTHER): Payer: Medicare Other | Admitting: *Deleted

## 2020-05-21 ENCOUNTER — Encounter: Payer: Self-pay | Admitting: Internal Medicine

## 2020-05-21 ENCOUNTER — Other Ambulatory Visit: Payer: Self-pay

## 2020-05-21 DIAGNOSIS — I480 Paroxysmal atrial fibrillation: Secondary | ICD-10-CM | POA: Diagnosis not present

## 2020-05-21 DIAGNOSIS — Z5181 Encounter for therapeutic drug level monitoring: Secondary | ICD-10-CM | POA: Diagnosis not present

## 2020-05-21 DIAGNOSIS — I482 Chronic atrial fibrillation, unspecified: Secondary | ICD-10-CM | POA: Diagnosis not present

## 2020-05-21 LAB — POCT INR: INR: 1.6 — AB (ref 2.0–3.0)

## 2020-05-21 NOTE — Patient Instructions (Signed)
Description    Take 1.5 tablets of warfarin today and then START taking warfarin 1 tablet daily except for 1/2 a tablet on mondays. Recheck INR in 2 weeks. Coumadin Clinic. [phone].

## 2020-05-22 ENCOUNTER — Other Ambulatory Visit: Payer: Self-pay | Admitting: Internal Medicine

## 2020-05-29 ENCOUNTER — Ambulatory Visit: Payer: BLUE CROSS/BLUE SHIELD | Admitting: Endocrinology

## 2020-06-04 ENCOUNTER — Other Ambulatory Visit: Payer: Self-pay

## 2020-06-04 ENCOUNTER — Ambulatory Visit (INDEPENDENT_AMBULATORY_CARE_PROVIDER_SITE_OTHER): Payer: Medicare Other | Admitting: *Deleted

## 2020-06-04 DIAGNOSIS — I482 Chronic atrial fibrillation, unspecified: Secondary | ICD-10-CM | POA: Diagnosis not present

## 2020-06-04 DIAGNOSIS — I480 Paroxysmal atrial fibrillation: Secondary | ICD-10-CM | POA: Diagnosis not present

## 2020-06-04 DIAGNOSIS — Z5181 Encounter for therapeutic drug level monitoring: Secondary | ICD-10-CM

## 2020-06-04 LAB — POCT INR: INR: 2.1 (ref 2.0–3.0)

## 2020-06-04 NOTE — Patient Instructions (Addendum)
Description   Continue taking warfarin 1 tablet daily except for 1/2 tablet on Mondays. Recheck INR in 3 weeks. Coumadin Clinic. [phone].

## 2020-06-06 ENCOUNTER — Telehealth: Payer: Self-pay

## 2020-06-06 NOTE — Telephone Encounter (Signed)
error 

## 2020-06-12 ENCOUNTER — Ambulatory Visit (INDEPENDENT_AMBULATORY_CARE_PROVIDER_SITE_OTHER): Payer: Medicare Other | Admitting: Endocrinology

## 2020-06-12 ENCOUNTER — Other Ambulatory Visit: Payer: Self-pay

## 2020-06-12 VITALS — BP 150/70 | HR 85 | Ht 69.0 in | Wt 165.0 lb

## 2020-06-12 DIAGNOSIS — E1122 Type 2 diabetes mellitus with diabetic chronic kidney disease: Secondary | ICD-10-CM | POA: Diagnosis not present

## 2020-06-12 DIAGNOSIS — E1159 Type 2 diabetes mellitus with other circulatory complications: Secondary | ICD-10-CM

## 2020-06-12 DIAGNOSIS — Z794 Long term (current) use of insulin: Secondary | ICD-10-CM | POA: Diagnosis not present

## 2020-06-12 DIAGNOSIS — N1832 Chronic kidney disease, stage 3b: Secondary | ICD-10-CM | POA: Diagnosis not present

## 2020-06-12 LAB — POCT GLYCOSYLATED HEMOGLOBIN (HGB A1C): Hemoglobin A1C: 8 % — AB (ref 4.0–5.6)

## 2020-06-12 MED ORDER — NOVOLIN 70/30 RELION (70-30) 100 UNIT/ML ~~LOC~~ SUSP: SUBCUTANEOUS | 3 refills | Status: DC

## 2020-06-12 NOTE — Patient Instructions (Addendum)
Please increase the insulin to 22 units with breakfast and 5 units with the evening meal (rather than at bedtime).   On this type of insulin schedule, you should eat meals on a regular schedule.  If a meal is missed or significantly delayed, your blood sugar could go low.   Please come back for a follow-up appointment in 3 months.   check your blood sugar twice a day.  vary the time of day when you check, between before the 3 meals, and at bedtime.  also check if you have symptoms of your blood sugar being too high or too low.  please keep a record of the readings and bring it to your next appointment here.  please call us sooner if your blood sugar goes below 70, or if you have a lot of readings over 200.

## 2020-06-12 NOTE — Progress Notes (Signed)
Subjective:    Patient ID: [name] [name], female    DOB: 07-Jan-1941, 80 y.o.   MRN: [id number]  HPI Pt returns for f/u of diabetes mellitus:   DM type: Insulin-requiring type 2.  Dx'ed: 0000000 Complications: PN, CAD, stage 3b CRI, and DR.  Therapy: insulin since soon after dx.  GDM: never.  DKA: never.  Severe hypoglycemia: last episode was in mid-2018.  Pancreatitis: never.  SDOH: She takes human insulin, due to cost.  Other: she is on BID premixed insulin, due to noncompliance with multiple daily injections.    Interval history: no cbg record, but states cbg's vary from 92-173.  There is no trend throughout the day.  pt states she feels well in general.   Past Medical History:  Diagnosis Date  . Atrial fibrillation ([place])    SVT dx 2007, cath 2007 mild CAD, then had an cardioversion, ablation; still on coumadin , has occ palpitation, EKG 03-2010 NSR  . Blindness of left eye   . CKD (chronic kidney disease), stage III ([place]) 06/16/2016   [name] 06/17/2016  . DIABETES MELLITUS, TYPE II 03/27/2006   dr [name]  . Eye muscle weakness    Right eye weakness after cataract surgery  . GERD (gastroesophageal reflux disease) 10/05/2011  . 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 HYPERLIPIDEMIA 03/27/2006  . HYPERTENSION 03/27/2006  . Intertrochanteric fracture of right hip ([place]) 07/13/2012  . 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 Osteopenia 2004   Dexa 2004 showed Osteopenia, DEXA 03/2007 normal  . Osteopenia   . Other chronic cystitis with hematuria   . Pelvic fracture ([place]) 06/16/2016   S/P fall  . Recurrent urinary tract infection    Seeing Urology  . RETINOPATHY, BACKGROUND NOS 03/27/2006  . Seizures ([place])   . 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[place]) Q000111Q    Past Surgical History:  Procedure Laterality Date  . ANKLE FRACTURE SURGERY Bilateral 08/2015   "right one was in 2 places"  . CARDIAC CATHETERIZATION N/A 05/17/2015   Procedure: Left  Heart Cath and Coronary Angiography;  Surgeon: Jettie Booze, MD;  Location: [facility];  Service: Cardiovascular;  Laterality: N/A;  . CARDIAC CATHETERIZATION N/A 05/17/2015   Procedure: Coronary Balloon Angioplasty;  Surgeon: Jettie Booze, MD;  Location: [facility];  Service: Cardiovascular;  Laterality: N/A;  . FEMUR IM NAIL Right 07/15/2012   Procedure: INTRAMEDULLARY (IM) NAIL HIP;  Surgeon: Johnny Bridge, MD;  Location: [facility];  Service: Orthopedics;  Laterality: Right;  . FEMUR IM NAIL Left 12/02/2015   Procedure: INTRAMEDULLARY (IM) NAIL FEMORAL;  Surgeon: [name], MD;  Location: [facility];  Service: Orthopedics;  Laterality: Left;  . FRACTURE SURGERY    . INCISION AND DRAINAGE HIP Left 01/03/2016   Procedure: IRRIGATION AND DEBRIDEMENT HIP;  Surgeon: [name], MD;  Location: [facility];  Service: Orthopedics;  Laterality: Left;  . SHOULDER SURGERY Bilateral    "don't remember which side"    Social History   Socioeconomic History  . Marital status: Married    Spouse name: Jenny Reichmann  . Number of children: 1  . Years of education: College  . Highest education level: Not on file  Occupational History  . Occupation: retired     Fish farm manager: RETIRED  Tobacco Use  . Smoking status: Former Smoker    Types: Cigarettes    Quit date: 03/02/1978  Years since quitting: 42.3  . Smokeless tobacco: Never Used  Vaping Use  . Vaping Use: Never used  Substance and Sexual Activity  . Alcohol use: No    Alcohol/week: 0.0 standard drinks  . Drug use: No  . Sexual activity: Not on file  Other Topics Concern  . Not on file  Social History Narrative   Patient lives at home spouse. Uses a walker consistently.   Moved from [place] 2004   1 son, problems w/ drugs, passed away 03-12-2016-- OD       Social Determinants of Health   Financial Resource Strain: Not on file  Food Insecurity: Not on file  Transportation Needs: Not on file  Physical Activity: Not on file  Stress:  Not on file  Social Connections: Not on file  Intimate Partner Violence: Not on file    Current Outpatient Medications on File Prior to Visit  Medication Sig Dispense Refill  . aspirin EC 81 MG tablet Take 1 tablet (81 mg total) by mouth daily. 30 tablet 3  . atorvastatin (LIPITOR) 80 MG tablet Take 1 tablet by mouth once daily 90 tablet 0  . calcium-vitamin D (OSCAL WITH D) 500-200 MG-UNIT per tablet Take 1 tablet by mouth daily.    . furosemide (LASIX) 40 MG tablet Take 1 tablet (40 mg total) by mouth 2 (two) times daily as needed for fluid. 180 tablet 0  . Multiple Vitamins-Minerals (MULTIVITAMIN ADULT PO) Take 1 tablet by mouth daily with breakfast.     . warfarin (COUMADIN) 2.5 MG tablet TAKE AS DIRECTED PER COUMADIN 100 tablet 0  . alendronate (FOSAMAX) 35 MG tablet Take 1 tablet (35 mg total) by mouth every 7 (seven) days. Take with a full glass of water on an empty stomach. 12 tablet 3  . augmented betamethasone dipropionate (DIPROLENE-AF) 0.05 % cream Apply topically 2 (two) times daily. 60 g 0  . cetirizine (ZYRTEC) 10 MG tablet Take 1 tablet (10 mg total) by mouth daily. 30 tablet 11  . fluticasone (FLONASE) 50 MCG/ACT nasal spray Place 2 sprays into both nostrils daily. (Patient not taking: Reported on 04/09/2020) 16 g 6   No current facility-administered medications on file prior to visit.     Family History  Problem Relation Age of Onset  . Diabetes Father   . Heart attack Father 18  . Diabetes Sister   . Drug abuse Son   . Cancer Neg Hx        no hx of colon or breast cancer    BP (!) 150/70 (BP Location: Right Arm, Patient Position: Sitting, Cuff Size: Normal)   Pulse 85   Ht '5\' 9"'$  (1.753 m)   Wt 165 lb (74.8 kg)   SpO2 97%   BMI 24.37 kg/m    Review of Systems She denies hypoglycemia    Objective:   Physical Exam VITAL SIGNS:  See vs page GENERAL: no distress Pulses: dorsalis pedis intact bilat.   MSK: no deformity of the feet CV: no leg edema Skin:   no ulcer on the feet.  normal color and temp on the feet. Neuro: sensation is intact to touch on the feet  Lab Results  Component Value Date   HGBA1C 8.0 (A) 06/12/2020       Assessment & Plan:  Insulin-requiring type 2 DM: uncontrolled.     Patient Instructions  Please increase the insulin to 22 units with breakfast and 5 units with the evening meal (rather than at bedtime).  On this type of insulin schedule, you should eat meals on a regular schedule.  If a meal is missed or significantly delayed, your blood sugar could go low.   Please come back for a follow-up appointment in 3 months.   check your blood sugar twice a day.  vary the time of day when you check, between before the 3 meals, and at bedtime.  also check if you have symptoms of your blood sugar being too high or too low.  please keep a record of the readings and bring it to your next appointment here.  please call us sooner if your blood sugar goes below 70, or if you have a lot of readings over 200.

## 2020-06-25 ENCOUNTER — Other Ambulatory Visit: Payer: Self-pay

## 2020-06-25 ENCOUNTER — Ambulatory Visit (INDEPENDENT_AMBULATORY_CARE_PROVIDER_SITE_OTHER): Payer: Medicare Other | Admitting: *Deleted

## 2020-06-25 DIAGNOSIS — I482 Chronic atrial fibrillation, unspecified: Secondary | ICD-10-CM

## 2020-06-25 DIAGNOSIS — I480 Paroxysmal atrial fibrillation: Secondary | ICD-10-CM

## 2020-06-25 DIAGNOSIS — Z5181 Encounter for therapeutic drug level monitoring: Secondary | ICD-10-CM

## 2020-06-25 LAB — POCT INR: INR: 2 (ref 2.0–3.0)

## 2020-06-25 NOTE — Patient Instructions (Signed)
Description   Today take 1.5 tablets then continue taking warfarin 1 tablet daily except for 1/2 tablet on Mondays. Recheck INR in 4 weeks at [facility] location with MD appt.  Coumadin Clinic. [phone].

## 2020-06-27 ENCOUNTER — Ambulatory Visit: Payer: Medicare Other | Admitting: Internal Medicine

## 2020-07-22 ENCOUNTER — Ambulatory Visit: Payer: BLUE CROSS/BLUE SHIELD | Admitting: Cardiovascular Disease

## 2020-07-22 ENCOUNTER — Ambulatory Visit (INDEPENDENT_AMBULATORY_CARE_PROVIDER_SITE_OTHER): Payer: Medicare Other

## 2020-07-22 ENCOUNTER — Other Ambulatory Visit: Payer: Self-pay

## 2020-07-22 DIAGNOSIS — I482 Chronic atrial fibrillation, unspecified: Secondary | ICD-10-CM | POA: Diagnosis not present

## 2020-07-22 DIAGNOSIS — Z5181 Encounter for therapeutic drug level monitoring: Secondary | ICD-10-CM | POA: Diagnosis not present

## 2020-07-22 DIAGNOSIS — I480 Paroxysmal atrial fibrillation: Secondary | ICD-10-CM

## 2020-07-22 LAB — POCT INR: INR: 1.6 — AB (ref 2.0–3.0)

## 2020-07-22 NOTE — Patient Instructions (Signed)
Today take 1.5 tablets today only and then continue taking warfarin 1 tablet daily except for 1/2 tablet on Mondays. Recheck in 2 weeks.  Coumadin Clinic. [phone].

## 2020-07-22 NOTE — Progress Notes (Incomplete)
Telehealth Virtual Visit:   {Choose 1 Note Type (Video or Telephone):[phone]}   The patient was identified using 2 identifiers.  Date:  07/22/2020   ID:  [name] [name], [name] [name] Apr 05, 1940, MRN [id number]  {Patient Location:[phone]::"Home"} {Provider Location:[phone]::"Home Office"}  PCP:  Colon Branch, MD  Cardiologist:  Skeet Latch, MD *** Electrophysiologist:  None   Evaluation Performed:  {Choose Visit [phone]::"Follow-Up Visit"}  Chief Complaint:  ***  History of Present Illness:     [name] [name] is a 80 y.o. 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  At her last appointment she was noted to be bradycardic into the 40s. Metoprolol was reduced to 50 mg twice a day.  Her lisinopril was increased to 5 mg daily.  Since she was last seen [name] [name] has been doing well.  She denies any chest pain or shortness of breath.  Her main complaint has been fatigue.  She doesn't have the energy to do anything around the house.  She also notes muliple episodes of nausea and emesis.  She has been throwing up for the last month.  The last time this occurred she had a UTI.  She denies any fever, chills, dysuria or foul-smelling urine.  She also has not noticed any lower extremity edema, orthopnea or PND.  Today,  She denies any chest pain, shortness of breath, palpitations, or exertional symptoms. No headaches, lightheadedness, or syncope to report. Also has no lower extremity edema, orthopnea or PND.  The patient {does/does not:200015} have symptoms concerning for COVID-19 infection (fever, chills, cough, or new shortness of breath).   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   HYPERLIPIDEMIA 03/27/2006   HYPERTENSION 03/27/2006   Intertrochanteric fracture of right hip ([place]) 07/13/2012   LUNG NODULE 09/01/2006   Excision, Bx Benighn   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"   Osteopenia 2004   Dexa 2004 showed Osteopenia, DEXA 03/2007 normal   Osteopenia    Other chronic cystitis with hematuria    Pelvic fracture ([place]) 06/16/2016   S/P fall   Recurrent urinary tract infection    Seeing Urology   RETINOPATHY, BACKGROUND NOS 03/27/2006   Seizures ([place])    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[place]) Q000111Q    Past Surgical History:  Procedure Laterality Date   ANKLE FRACTURE SURGERY Bilateral 08/2015   "right one was in 2 places"   CARDIAC CATHETERIZATION N/A 05/17/2015   Procedure: Left Heart Cath and Coronary Angiography;  Surgeon: Jettie Booze, MD;  Location: [place] [facility];  Service: Cardiovascular;  Laterality: N/A;   CARDIAC CATHETERIZATION N/A 05/17/2015   Procedure: Coronary Balloon Angioplasty;  Surgeon: Jettie Booze, MD;  Location: [place] [facility];  Service: Cardiovascular;  Laterality: N/A;   FEMUR IM NAIL Right 07/15/2012   Procedure: INTRAMEDULLARY (IM) NAIL HIP;  Surgeon: Johnny Bridge, MD;  Location: [place];  Service: Orthopedics;  Laterality: Right;   FEMUR IM NAIL Left 12/02/2015   Procedure: INTRAMEDULLARY (IM) NAIL FEMORAL;  Surgeon: [name], MD;  Location: [place];  Service: Orthopedics;  Laterality: Left;   FRACTURE SURGERY     INCISION AND DRAINAGE HIP Left 01/03/2016   Procedure: IRRIGATION AND DEBRIDEMENT HIP;  Surgeon: [name], MD;  Location: [place];   Service: Orthopedics;  Laterality: Left;   SHOULDER SURGERY Bilateral    "don't remember which side"     Current Outpatient Medications  Medication Sig Dispense Refill   alendronate (FOSAMAX) 35 MG tablet Take 1 tablet (35 mg total) by mouth every 7 (seven) days. Take with a full glass of water on an empty stomach. 12 tablet 3   aspirin EC 81 MG tablet Take 1 tablet (81 mg total) by mouth daily. 30 tablet 3   atorvastatin (LIPITOR) 80 MG tablet Take 1 tablet by mouth once daily 90 tablet 0   augmented betamethasone dipropionate (DIPROLENE-AF) 0.05 % cream Apply topically 2 (two) times daily. 60 g 0   calcium-vitamin D (OSCAL WITH D) 500-200 MG-UNIT per tablet Take 1 tablet by mouth daily.     cetirizine (ZYRTEC) 10 MG tablet Take 1 tablet (10 mg total) by mouth daily. 30 tablet 11   fluticasone (FLONASE) 50 MCG/ACT nasal spray Place 2 sprays into both nostrils daily. (Patient not taking: Reported on 04/09/2020) 16 g 6   furosemide (LASIX) 40 MG tablet Take 1 tablet (40 mg total) by mouth 2 (two) times daily as needed for fluid. 180 tablet 0   insulin NPH-regular Human (NOVOLIN 70/30 RELION) (70-30) 100 UNIT/ML injection 22 units with breakfast and 5 units with the evening meal. 20 mL 3   Multiple Vitamins-Minerals (MULTIVITAMIN ADULT PO) Take 1 tablet by mouth daily with breakfast.      warfarin (COUMADIN) 2.5 MG tablet TAKE AS DIRECTED PER COUMADIN 100 tablet 0   No current facility-administered medications for this visit.    Allergies:   Procaine hcl and Amoxicillin    Social History:  The patient  reports that she quit smoking about 42 years ago. Her smoking use included cigarettes. She has never used smokeless tobacco. She reports that she does not drink alcohol and does not use drugs.   Family History:  The patient's  family history includes Diabetes in her father and sister; Drug abuse in her son; Heart attack (age of onset: 32) in her father.    ROS:   Please see  the history of present illness. (+) All other systems are reviewed and negative.    PHYSICAL EXAM: There were no vitals taken for this visit. GENERAL: Well-appearing.  No acute distress. HEENT: Pupils equal round.  Oral mucosa unremarkable NECK:  No jugular venous distention, no visible thyromegaly EXT:  No edema, no cyanosis no clubbing SKIN:  No rashes no nodules NEURO:  Speech fluent.  Cranial nerves grossly intact.  Moves all 4 extremities freely PSYCH:  Cognitively intact, oriented to person place and time   EKG:   07/22/2020: *** 08/01/2015: sinus Rhythm. Rate 74 bpm. Lateral T-wave inversions. Unchanged from prior, no bradycardia has resolved  Echo 05/10/15: Study Conclusions  - Left ventricle: The cavity size was normal. There was moderate  concentric hypertrophy. Systolic function was moderately reduced.  The estimated ejection fraction was in the range of 35% to 40%.  Diffuse hypokinesis. The study was not technically sufficient  to  allow evaluation of LV diastolic dysfunction due to atrial  fibrillation. - Aortic valve: Trileaflet; normal thickness leaflets. There was no  regurgitation. - Aortic root: The aortic root was normal in size. - Mitral valve: Mildly thickened leaflets . There was mild  regurgitation. - Left atrium: The atrium was moderately dilated. - Right ventricle: The cavity size was normal. Wall thickness was  normal. Systolic function was normal. - Right atrium: The atrium was normal in size. - Tricuspid valve: There was moderate regurgitation. - Pulmonic valve: There was no regurgitation. - Pulmonary arteries: Systolic pressure was mildly to moderately  increased. PA peak pressure: 45 mm Hg (S). - Inferior vena cava: The vessel was normal in size. - Pericardium, extracardiac: There was no pericardial effusion.  LHC 05/17/15:  There is moderate left ventricular systolic dysfunction.  1st Mrg lesion, 100% stenosed. Intervention was  performed with balloon angioplasty. No stent was placed.  At the end of the procedure, the initial occlusion site had no residual stenosis. There did remain a large branch downstream of the initial occlusion which remained occluded and did not respond to angioplasty. There is a smaller branch which had TIMI-3 flow restored. The patient was pain-free at the end of the procedure.  Moderate LV dysfunction. Moderately elevated LVEDP.    LE Venous Bilateral Duplex 12/14/2015: Findings consistent with no occlusive, acute deep vein thrombosis  involving the femoral vein of the left lower extremity. Sluggish  flow noted in the left femoral vein. The flow in the common femoral  vein appears continuous, bilaterally, possibly suggestive of  compression or thrombus of veins higher than I am able to  visualize.   Recent Labs: 05/02/2020: ALT 11; BUN 28; Creatinine 1.7; Hemoglobin 12.7; Platelets 353; Potassium 4.7; Sodium 140    Lipid Panel    Component Value Date/Time   CHOL 131 01/04/2019 0953   TRIG 116.0 01/04/2019 0953   HDL 44.30 01/04/2019 0953   CHOLHDL 3 01/04/2019 0953   VLDL 23.2 01/04/2019 0953   LDLCALC 63 01/04/2019 0953      Wt Readings from Last 3 Encounters:  06/12/20 165 lb (74.8 kg)  04/09/20 166 lb (75.3 kg)  02/27/20 167 lb (75.8 kg)      ASSESSMENT AND PLAN: No problem-specific Assessment & Plan notes found for this encounter.   # CAD s/p NSTEMI: Ms. Bredesen is doing well and denies recurrent chest pain.  Continue aspirin, Plavix, metoprolol and atorvastatin.  Stop aspirin and continue Plavix and coumadin only.   # Atrial fibrillation: She is currently in sinus rhythm and has not noted any recurrent palpitations.  Her heart rate has improved with the reduction in her metoprolol. Continue warfarin. This patients CHA2DS2-VASc Score and unadjusted Ischemic Stroke Rate (% per year) is equal to 9.7 % stroke rate/year from a score of 6  Above score calculated as 1 point  each if present [CHF, HTN, DM, Vascular=MI/PAD/Aortic Plaque, Age if 65-74, or Female] Above score calculated as 2 points each if present [Age > 75, or Stroke/TIA/TE]  # Chronic systolic and diastolic heart failure: She appears to be euvolemic on exam and is doing well.  Continue lisinopril, lasix and metoprolol as above.    # Hypertensive heart disease: Blood pressure is well-controlled today.  Continue metoprolol and lisinopril.  # Nausea: Ms. Salih reports nausea and emesis lately. The last time this happened she had a urinary tract infection. We will check a urinalysis and a CBC today. If this is within normal  limits and he recommended that she follow-up with a gastroenterologist.   COVID-19 Education: The signs and symptoms of COVID-19 were discussed with the patient and how to seek care for testing (follow up with PCP or arrange E-visit).  ***The importance of social distancing was discussed today.  Time:   Today, I have spent *** minutes with the patient with telehealth technology discussing the above problems.     Current medicines are reviewed at length with the patient today.  The patient does not have concerns regarding medicines.  The following changes have been made:  Stop aspirin Labs/ tests ordered today include:   No orders of the defined types were placed in this encounter.    Disposition:   FU with Tiffany C. Oval Linsey, MD, [facility] in *** months.    This note was written with the assistance of speech recognition software.  Please excuse any transcriptional errors.  Signed, Tiffany C. Oval Linsey, MD, [name]  07/22/2020 7:40 AM    [place]

## 2020-07-26 ENCOUNTER — Ambulatory Visit: Payer: BLUE CROSS/BLUE SHIELD | Admitting: Cardiovascular Disease

## 2020-08-05 ENCOUNTER — Other Ambulatory Visit: Payer: Self-pay

## 2020-08-05 ENCOUNTER — Ambulatory Visit (INDEPENDENT_AMBULATORY_CARE_PROVIDER_SITE_OTHER): Payer: Medicare Other

## 2020-08-05 DIAGNOSIS — I480 Paroxysmal atrial fibrillation: Secondary | ICD-10-CM

## 2020-08-05 DIAGNOSIS — I482 Chronic atrial fibrillation, unspecified: Secondary | ICD-10-CM | POA: Diagnosis not present

## 2020-08-05 DIAGNOSIS — Z5181 Encounter for therapeutic drug level monitoring: Secondary | ICD-10-CM | POA: Diagnosis not present

## 2020-08-05 LAB — POCT INR: INR: 2.1 (ref 2.0–3.0)

## 2020-08-05 NOTE — Patient Instructions (Signed)
-   continue taking warfarin 1 tablet daily except for 1/2 tablet on Mondays. - Recheck in 3 weeks.   Coumadin Clinic. [phone].

## 2020-08-07 ENCOUNTER — Ambulatory Visit (INDEPENDENT_AMBULATORY_CARE_PROVIDER_SITE_OTHER): Payer: Medicare Other | Admitting: Internal Medicine

## 2020-08-07 ENCOUNTER — Ambulatory Visit: Payer: Medicare Other | Attending: Internal Medicine

## 2020-08-07 ENCOUNTER — Other Ambulatory Visit: Payer: Self-pay

## 2020-08-07 ENCOUNTER — Encounter: Payer: Self-pay | Admitting: Internal Medicine

## 2020-08-07 VITALS — BP 116/74 | HR 96 | Temp 97.8°F | Resp 18 | Ht 69.0 in | Wt 162.2 lb

## 2020-08-07 DIAGNOSIS — N184 Chronic kidney disease, stage 4 (severe): Secondary | ICD-10-CM | POA: Diagnosis not present

## 2020-08-07 DIAGNOSIS — Z794 Long term (current) use of insulin: Secondary | ICD-10-CM

## 2020-08-07 DIAGNOSIS — N1832 Chronic kidney disease, stage 3b: Secondary | ICD-10-CM

## 2020-08-07 DIAGNOSIS — Z1159 Encounter for screening for other viral diseases: Secondary | ICD-10-CM

## 2020-08-07 DIAGNOSIS — I480 Paroxysmal atrial fibrillation: Secondary | ICD-10-CM

## 2020-08-07 DIAGNOSIS — E119 Type 2 diabetes mellitus without complications: Secondary | ICD-10-CM | POA: Diagnosis not present

## 2020-08-07 DIAGNOSIS — E785 Hyperlipidemia, unspecified: Secondary | ICD-10-CM | POA: Diagnosis not present

## 2020-08-07 DIAGNOSIS — Z0100 Encounter for examination of eyes and vision without abnormal findings: Secondary | ICD-10-CM

## 2020-08-07 DIAGNOSIS — Z23 Encounter for immunization: Secondary | ICD-10-CM

## 2020-08-07 DIAGNOSIS — E1122 Type 2 diabetes mellitus with diabetic chronic kidney disease: Secondary | ICD-10-CM | POA: Diagnosis not present

## 2020-08-07 LAB — LIPID PANEL
Cholesterol: 216 mg/dL — ABNORMAL HIGH (ref 0–200)
HDL: 49.8 mg/dL (ref >39.00)
LDL Cholesterol: 138 mg/dL — ABNORMAL HIGH (ref 0–99)
NonHDL: 165.86
Total CHOL/HDL Ratio: 4
Triglycerides: 139 mg/dL (ref 0.0–149.0)
VLDL: 27.8 mg/dL (ref 0.0–40.0)

## 2020-08-07 LAB — TSH: TSH: 1.2 u[IU]/mL (ref 0.35–4.50)

## 2020-08-07 NOTE — Patient Instructions (Addendum)
We have placed a referral back to [facility] for your diabetic eye exam, it is important that you do this at least once yearly to protect your vision. Please expect a call in the next several days to schedule an appointment.    Check the  blood pressure  BP GOAL is between 110/65 and  135/85. If it is consistently higher or lower, let me know     GO TO THE LAB : Get the blood work     [place], [place] back for  A checkup in 4 to 6 months

## 2020-08-07 NOTE — Progress Notes (Signed)
   Covid-19 Vaccination Clinic  Name:  [name] [name]    MRN: [id number] DOB: 31-Jul-1940  08/07/2020  [name] was observed post Covid-19 immunization for 15 minutes without incident. She was provided with Vaccine Information Sheet and instruction to access the V-Safe system.   [name] was instructed to call 911 with any severe reactions post vaccine: [name] Difficulty breathing  . Swelling of face and throat  . A fast heartbeat  . A bad rash all over body  . Dizziness and weakness   Immunizations Administered    Name Date Dose VIS Date Route   PFIZER Comrnaty(Gray TOP) Covid-19 Vaccine 08/07/2020 11:32 AM 0.3 mL 02/08/2020 Intramuscular   Manufacturer: [employer]   Lot: [id number]   [place]: [phone]

## 2020-08-07 NOTE — Progress Notes (Signed)
Subjective:    Patient ID: [name] [name], female    DOB: 1940/07/26, 80 y.o.   MRN: [id number]  DOS:  08/07/2020 Type of visit - description: Follow-up  Since the last office visit is doing well.  Still get dizzy from time to time, symptoms are not associated with nausea, vomiting.  No headache, slurred speech or motor deficits.  Still gets occasionally some chest discomfort immediately after dinner associated with nausea and vomiting.  This does not happen often.  No abdominal pain, blood in the stools, heartburn.  No dysphagia or odynophagia.   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   HYPERLIPIDEMIA 03/27/2006   HYPERTENSION 03/27/2006   Intertrochanteric fracture of right hip ([place]) 07/13/2012   LUNG NODULE 09/01/2006   Excision, Bx Benighn   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"   Osteopenia 2004   Dexa 2004 showed Osteopenia, DEXA 03/2007 normal   Osteopenia    Other chronic cystitis with hematuria    Pelvic fracture ([place]) 06/16/2016   S/P fall   Recurrent urinary tract infection    Seeing Urology   RETINOPATHY, BACKGROUND NOS 03/27/2006   Seizures ([place])    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[place]) Q000111Q    Past Surgical History:  Procedure Laterality Date   ANKLE FRACTURE SURGERY Bilateral 08/2015   "right one was in 2 places"   CARDIAC CATHETERIZATION N/A 05/17/2015   Procedure: Left Heart Cath and Coronary Angiography;  Surgeon: Jettie Booze, MD;  Location: [place] [facility];   Service: Cardiovascular;  Laterality: N/A;   CARDIAC CATHETERIZATION N/A 05/17/2015   Procedure: Coronary Balloon Angioplasty;  Surgeon: Jettie Booze, MD;  Location: [place] [facility];  Service: Cardiovascular;  Laterality: N/A;   FEMUR IM NAIL Right 07/15/2012   Procedure: INTRAMEDULLARY (IM) NAIL HIP;  Surgeon: Johnny Bridge, MD;  Location: [place];  Service: Orthopedics;  Laterality: Right;   FEMUR IM NAIL Left 12/02/2015   Procedure: INTRAMEDULLARY (IM) NAIL FEMORAL;  Surgeon: [name], MD;  Location: [place];  Service: Orthopedics;  Laterality: Left;   FRACTURE SURGERY     INCISION AND DRAINAGE HIP Left 01/03/2016   Procedure: IRRIGATION AND DEBRIDEMENT HIP;  Surgeon: [name], MD;  Location: [place];  Service: Orthopedics;  Laterality: Left;   SHOULDER SURGERY Bilateral    "don't remember which side"    Allergies as of 08/07/2020       Reactions   Procaine Hcl Anaphylaxis   Amoxicillin Itching   Did it involve swelling of the face/tongue/throat, SOB, or low BP? No Did it involve sudden or severe rash/hives, skin peeling, or any reaction on the inside of your mouth or nose? No Did you need to seek medical attention at a hospital or doctor's office? No When did it last happen? unk If all above answers are "NO", may proceed with cephalosporin use.        Medication  List        Accurate as of August 07, 2020 11:59 PM. If you have any questions, ask your nurse or doctor.          STOP taking these medications    aspirin EC 81 MG tablet Stopped by: [name], MD   augmented betamethasone dipropionate 0.05 % cream Commonly known as: DIPROLENE-AF Stopped by: [name], MD   cetirizine 10 MG tablet Commonly known as: ZYRTEC Stopped by: [name], MD   fluticasone 50 MCG/ACT nasal spray Commonly known as: FLONASE Stopped by: [name], MD       TAKE these medications    alendronate 35 MG tablet Commonly known as: FOSAMAX Take 1 tablet (35 mg total) by mouth  every 7 (seven) days. Take with a full glass of water on an empty stomach.   atorvastatin 80 MG tablet Commonly known as: LIPITOR Take 1 tablet by mouth once daily   calcium-vitamin D 500-200 MG-UNIT tablet Commonly known as: OSCAL WITH D Take 1 tablet by mouth daily.   furosemide 40 MG tablet Commonly known as: LASIX Take 1 tablet (40 mg total) by mouth 2 (two) times daily as needed for fluid.   MULTIVITAMIN ADULT PO Take 1 tablet by mouth daily with breakfast.   NovoLIN 70/30 ReliOn (70-30) 100 UNIT/ML injection Generic drug: insulin NPH-regular Human 22 units with breakfast and 5 units with the evening meal.   warfarin 2.5 MG tablet Commonly known as: COUMADIN Take as directed by the anticoagulation clinic. If you are unsure how to take this medication, talk to your nurse or doctor. Original instructions: TAKE AS DIRECTED PER COUMADIN           Objective:   Physical Exam BP 116/74 (BP Location: Left Arm, Patient Position: Sitting, Cuff Size: Normal)   Pulse 96   Temp 97.8 F (36.6 C) (Oral)   Resp 18   Ht '5\' 9"'$  (1.753 m)   Wt 162 lb 4 oz (73.6 kg)   SpO2 96%   BMI 23.96 kg/m  General:   Well developed, NAD, BMI noted. HEENT:  Normocephalic . Face symmetric, atraumatic Lungs:  CTA B Normal respiratory effort, no intercostal retractions, no accessory muscle use. Heart: RRR,  no murmur.  Lower extremities: no pretibial edema bilaterally  Skin: Not pale. Not jaundice Neurologic:  alert & oriented X3.  Speech normal, gait appropriate for age and unassisted Psych--  Cognition and judgment appear intact.  Cooperative with normal attention span and concentration.  Behavior appropriate. No anxious or depressed appearing.      Assessment      Assessment   DM  + retinopathy, Dr [name] HTN CKD:  -Creatinine 1.5 = GFR ~35 -Elevated creatinine(started after admission 05-2015, had IV contrast) Hyperlipidemia anxiety ativan GERD Anemia, normal iron 05-2015,  never had a colonoscopy CV ---NSTEMI 05-2015, cath, angiplasty ---Echo, EF 40 % (05-2015) ---Atrial fibrillation, SVT -- dx 2007, cath 2007 mild CAD. S/p cardioversion, then ablation, on coumadin, last visit cards 2014  OSTEOPOROSIS: T score -1.8 (2018), Hip Fx 2017.  Rx alendronate 07/2018. GU Urinary retention /UTI 05-2015, had a foley temporarily, saw urology, PVR 249 cc (high), rx timed voiding Neuro: -Herpes encephalitis 2014 -Seizures (after encephalitis), sz episode 08-2015 (?) - HAs Blindness, left eye Right eye weakness after cataract surgery Osteoporosis:   DEXA 2004, DEXA 2009 normal,  dexa 2015 @ [facility] -1.8. T score -1.8 in 2018.  Hip fracture 2017. L1 vertebral fracture 2020.  Started alendronate 07/2018  H/o lung bx 2008 benign   PLAN: DM: Per Endo HTN: BP today is very good, not on medications, on Lasix as needed for her heart. CKD: Saw nephrology 05/02/2020, note reviewed, CKD stage G4/A1, GFR 15-29. Renal failure felt to be due to DM, small vessel disease.  BP not robust enough for ACE or ARB's. GFR too low for SGLT2's. They recommended to avoid NSAIDs, IV contrast, Fleet enemas and PPIs CAD, ischemic cardiomyopathy, paroxysmal A. fib, SVT, s/p ablation: All previous EKGs have been sinus rhythm, bradycardia.  Last seen by cardiology virtually 06/19/2019, rec cont w/ coumadin. High cholesterol: Check FLP, TSH, continue Lipitor Preventive care: Check hep C RTC 4 months   Time spent: 30 minutes, reviewing latest nephrology notes last cardiology visit.  This visit occurred during the SARS-CoV-2 public health emergency.  Safety protocols were in place, including screening questions prior to the visit, additional usage of staff PPE, and extensive cleaning of exam room while observing appropriate contact time as indicated for disinfecting solutions.

## 2020-08-08 ENCOUNTER — Other Ambulatory Visit (HOSPITAL_BASED_OUTPATIENT_CLINIC_OR_DEPARTMENT_OTHER): Payer: Self-pay

## 2020-08-08 ENCOUNTER — Encounter: Payer: Self-pay | Admitting: Internal Medicine

## 2020-08-08 LAB — HEPATITIS C ANTIBODY
Hepatitis C Ab: NONREACTIVE
SIGNAL TO CUT-OFF: 0.01 (ref <1.00)

## 2020-08-08 MED ORDER — COVID-19 MRNA VAC-TRIS(PFIZER) 30 MCG/0.3ML IM SUSP
INTRAMUSCULAR | 0 refills | Status: DC
Start: 2020-08-07 — End: 2020-08-22
  Filled 2020-08-08: qty 0.3, 1d supply, fill #0

## 2020-08-08 NOTE — Assessment & Plan Note (Signed)
DM: Per Endo HTN: BP today is very good, not on medications, on Lasix as needed for her heart. CKD: Saw nephrology 05/02/2020, note reviewed, CKD stage G4/A1, GFR 15-29. Renal failure felt to be due to DM, small vessel disease.  BP not robust enough for ACE or ARB's. GFR too low for SGLT2's. They recommended to avoid NSAIDs, IV contrast, Fleet enemas and PPIs CAD, ischemic cardiomyopathy, paroxysmal A. fib, SVT, s/p ablation: All previous EKGs have been sinus rhythm, bradycardia.  Last seen by cardiology virtually 06/19/2019, rec cont w/ coumadin. High cholesterol: Check FLP, TSH, continue Lipitor Preventive care: Check hep C RTC 4 months

## 2020-08-19 ENCOUNTER — Other Ambulatory Visit: Payer: Self-pay

## 2020-08-19 DIAGNOSIS — I480 Paroxysmal atrial fibrillation: Secondary | ICD-10-CM

## 2020-08-19 MED ORDER — WARFARIN SODIUM 2.5 MG PO TABS
ORAL_TABLET | ORAL | 0 refills | Status: DC
Start: 2020-08-19 — End: 2020-10-02

## 2020-08-19 NOTE — Telephone Encounter (Signed)
Pt requesting refill of warfarin to be sent to [facility] but hasn't seen a provider in quite sometime and that was kilroy who is retired 2 years ago. If I sent a month supply who could I sign it under? Will route to pharmd pool to address

## 2020-08-22 ENCOUNTER — Other Ambulatory Visit: Payer: Self-pay

## 2020-08-22 ENCOUNTER — Ambulatory Visit (INDEPENDENT_AMBULATORY_CARE_PROVIDER_SITE_OTHER): Payer: Medicare Other

## 2020-08-22 ENCOUNTER — Ambulatory Visit: Payer: Medicare Other

## 2020-08-22 VITALS — BP 148/78 | HR 76 | Temp 97.7°F | Resp 16 | Ht 69.0 in | Wt 163.2 lb

## 2020-08-22 DIAGNOSIS — Z0000 Encounter for general adult medical examination without abnormal findings: Secondary | ICD-10-CM | POA: Diagnosis not present

## 2020-08-22 NOTE — Patient Instructions (Signed)
[name] [name] , Thank you for taking time to come for your Medicare Wellness Visit. I appreciate your ongoing commitment to your health goals. Please review the following plan we discussed and let me know if I can assist you in the future.   Screening recommendations/referrals: Colonoscopy: No longer require Mammogram: Due-Declined. Please call the office to schedule if you change your mind. Bone Density: Due-Declined. Please call the office to schedule if you change your mind. Recommended yearly ophthalmology/optometry visit for glaucoma screening and checkup Recommended yearly dental visit for hygiene and checkup  Vaccinations: Influenza vaccine: Up to date Pneumococcal vaccine: Up to date Tdap vaccine: Up to date-Due 01/23/2024 Shingles vaccine: Discuss with pharmacy   Covid-19:Up to date  Advanced directives: Please bring a copy for your chart  Conditions/risks identified: See problem list  Next appointment: Follow up in one year for your annual wellness visit    Preventive Care 74 Years and Older, Female Preventive care refers to lifestyle choices and visits with your health care provider that can promote health and wellness. What does preventive care include? A yearly physical exam. This is also called an annual well check. Dental exams once or twice a year. Routine eye exams. Ask your health care provider how often you should have your eyes checked. Personal lifestyle choices, including: Daily care of your teeth and gums. Regular physical activity. Eating a healthy diet. Avoiding tobacco and drug use. Limiting alcohol use. Practicing safe sex. Taking low-dose aspirin every day. Taking vitamin and mineral supplements as recommended by your health care provider. What happens during an annual well check? The services and screenings done by your health care provider during your annual well check will depend on your age, overall health, lifestyle risk factors, and family  history of disease. Counseling  Your health care provider may ask you questions about your: Alcohol use. Tobacco use. Drug use. Emotional well-being. Home and relationship well-being. Sexual activity. Eating habits. History of falls. Memory and ability to understand (cognition). Work and work Statistician. Reproductive health. Screening  You may have the following tests or measurements: Height, weight, and BMI. Blood pressure. Lipid and cholesterol levels. These may be checked every 5 years, or more frequently if you are over 58 years old. Skin check. Lung cancer screening. You may have this screening every year starting at age 38 if you have a 30-pack-year history of smoking and currently smoke or have quit within the past 15 years. Fecal occult blood test (FOBT) of the stool. You may have this test every year starting at age 38. Flexible sigmoidoscopy or colonoscopy. You may have a sigmoidoscopy every 5 years or a colonoscopy every 10 years starting at age 7. Hepatitis C blood test. Hepatitis B blood test. Sexually transmitted disease (STD) testing. Diabetes screening. This is done by checking your blood sugar (glucose) after you have not eaten for a while (fasting). You may have this done every 1-3 years. Bone density scan. This is done to screen for osteoporosis. You may have this done starting at age 73. Mammogram. This may be done every 1-2 years. Talk to your health care provider about how often you should have regular mammograms. Talk with your health care provider about your test results, treatment options, and if necessary, the need for more tests. Vaccines  Your health care provider may recommend certain vaccines, such as: Influenza vaccine. This is recommended every year. Tetanus, diphtheria, and acellular pertussis (Tdap, Td) vaccine. You may need a Td booster every 10 years. Zoster  vaccine. You may need this after age 61. Pneumococcal 13-valent conjugate (PCV13)  vaccine. One dose is recommended after age 50. Pneumococcal polysaccharide (PPSV23) vaccine. One dose is recommended after age 67. Talk to your health care provider about which screenings and vaccines you need and how often you need them. This information is not intended to replace advice given to you by your health care provider. Make sure you discuss any questions you have with your health care provider. Document Released: 03/15/2015 Document Revised: 11/06/2015 Document Reviewed: 12/18/2014 Elsevier Interactive Patient Education  2017 [employer] Prevention in the Home Falls can cause injuries. They can happen to people of all ages. There are many things you can do to make your home safe and to help prevent falls. What can I do on the outside of my home? Regularly fix the edges of walkways and driveways and fix any cracks. Remove anything that might make you trip as you walk through a door, such as a raised step or threshold. Trim any bushes or trees on the path to your home. Use bright outdoor lighting. Clear any walking paths of anything that might make someone trip, such as rocks or tools. Regularly check to see if handrails are loose or broken. Make sure that both sides of any steps have handrails. Any raised decks and porches should have guardrails on the edges. Have any leaves, snow, or ice cleared regularly. Use sand or salt on walking paths during winter. Clean up any spills in your garage right away. This includes oil or grease spills. What can I do in the bathroom? Use night lights. Install grab bars by the toilet and in the tub and shower. Do not use towel bars as grab bars. Use non-skid mats or decals in the tub or shower. If you need to sit down in the shower, use a plastic, non-slip stool. Keep the floor dry. Clean up any water that spills on the floor as soon as it happens. Remove soap buildup in the tub or shower regularly. Attach bath mats securely with  double-sided non-slip rug tape. Do not have throw rugs and other things on the floor that can make you trip. What can I do in the bedroom? Use night lights. Make sure that you have a light by your bed that is easy to reach. Do not use any sheets or blankets that are too big for your bed. They should not hang down onto the floor. Have a firm chair that has side arms. You can use this for support while you get dressed. Do not have throw rugs and other things on the floor that can make you trip. What can I do in the kitchen? Clean up any spills right away. Avoid walking on wet floors. Keep items that you use a lot in easy-to-reach places. If you need to reach something above you, use a strong step stool that has a grab bar. Keep electrical cords out of the way. Do not use floor polish or wax that makes floors slippery. If you must use wax, use non-skid floor wax. Do not have throw rugs and other things on the floor that can make you trip. What can I do with my stairs? Do not leave any items on the stairs. Make sure that there are handrails on both sides of the stairs and use them. Fix handrails that are broken or loose. Make sure that handrails are as long as the stairways. Check any carpeting to make sure that it  is firmly attached to the stairs. Fix any carpet that is loose or worn. Avoid having throw rugs at the top or bottom of the stairs. If you do have throw rugs, attach them to the floor with carpet tape. Make sure that you have a light switch at the top of the stairs and the bottom of the stairs. If you do not have them, ask someone to add them for you. What else can I do to help prevent falls? Wear shoes that: Do not have high heels. Have rubber bottoms. Are comfortable and fit you well. Are closed at the toe. Do not wear sandals. If you use a stepladder: Make sure that it is fully opened. Do not climb a closed stepladder. Make sure that both sides of the stepladder are locked  into place. Ask someone to hold it for you, if possible. Clearly mark and make sure that you can see: Any grab bars or handrails. First and last steps. Where the edge of each step is. Use tools that help you move around (mobility aids) if they are needed. These include: Canes. Walkers. Scooters. Crutches. Turn on the lights when you go into a dark area. Replace any light bulbs as soon as they burn out. Set up your furniture so you have a clear path. Avoid moving your furniture around. If any of your floors are uneven, fix them. If there are any pets around you, be aware of where they are. Review your medicines with your doctor. Some medicines can make you feel dizzy. This can increase your chance of falling. Ask your doctor what other things that you can do to help prevent falls. This information is not intended to replace advice given to you by your health care provider. Make sure you discuss any questions you have with your health care provider. Document Released: 12/13/2008 Document Revised: 07/25/2015 Document Reviewed: 03/23/2014 Elsevier Interactive Patient Education  2017 [employer].

## 2020-08-22 NOTE — Progress Notes (Signed)
Subjective:   RANEEN GERY is a 80 y.o. female who presents for Medicare Annual (Subsequent) preventive examination.  Review of Systems     Cardiac Risk Factors include: advanced age (>62mn, >>43women);diabetes mellitus;dyslipidemia;hypertension     Objective:    Today's Vitals   08/22/20 1338  BP: (!) 148/78  Pulse: 76  Resp: 16  Temp: 97.7 F (36.5 C)  TempSrc: Temporal  SpO2: 97%  Weight: 163 lb 3.2 oz (74 kg)  Height: '5\' 9"'$  (1.753 m)   Body mass index is 24.1 kg/m.  Advanced Directives 08/22/2020 02/21/2020 01/26/2020 07/19/2018 07/15/2018 06/02/2018 10/02/2016  Does Patient Have a Medical Advance Directive? Yes Yes Yes No No No No;Yes  Type of [place]aramed[place]of [place] will - (No Data) - - - HPress photographerLiving will  Does patient want to make changes to medical advance directive? - - - - - - -  Copy of H[place]in Chart? No - copy requested - - - - - -  Would patient like information on creating a medical advance directive? - - - No - Patient declined No - Patient declined - -  Pre-existing out of facility DNR order (yellow form or pink MOST form) - - - - - - -    Current Medications (verified) Outpatient Encounter Medications as of 08/22/2020  Medication Sig   atorvastatin (LIPITOR) 80 MG tablet Take 1 tablet by mouth once daily   calcium-vitamin D (OSCAL WITH D) 500-200 MG-UNIT per tablet Take 1 tablet by mouth daily.   insulin NPH-regular Human (NOVOLIN 70/30 RELION) (70-30) 100 UNIT/ML injection 22 units with breakfast and 5 units with the evening meal.   Multiple Vitamins-Minerals (MULTIVITAMIN ADULT PO) Take 1 tablet by mouth daily with breakfast.    warfarin (COUMADIN) 2.5 MG tablet TAKE 1 to 2 tablets daily or as directed by Coumadin clinic   furosemide (LASIX) 40 MG tablet Take 1 tablet (40 mg total) by mouth 2 (two) times daily as needed for fluid. (Patient not taking: No sig reported)    [DISCONTINUED] COVID-19 mRNA Vac-TriS, Pfizer, SUSP injection Inject into the muscle.   No facility-administered encounter medications on file as of 08/22/2020.    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   HYPERLIPIDEMIA 03/27/2006   HYPERTENSION 03/27/2006   Intertrochanteric fracture of right hip ([place] 07/13/2012   LUNG NODULE 09/01/2006   Excision, Bx Ben[facility]   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"   Osteopenia 2004   Dexa 2004 showed Osteopenia, DEXA 03/2007 normal   Osteopenia    Other chronic cystitis with hematuria    Pelvic fracture ([place] 06/16/2016   S/P fall   Recurrent urinary tract infection    Seeing Urology   RETINOPATHY, BACKGROUND NOS 03/27/2006   Seizures (HS[place]    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HCos Cob [id number]  Past Surgical History:  Procedure Laterality Date  ANKLE FRACTURE SURGERY Bilateral 08/2015   "right one was in 2 places"   CARDIAC CATHETERIZATION N/A 05/17/2015   Procedure: Left Heart Cath and Coronary Angiography;  Surgeon: Jettie Booze, MD;  Location: [place] [facility];  Service: Cardiovascular;  Laterality: N/A;   CARDIAC CATHETERIZATION N/A 05/17/2015   Procedure: Coronary Balloon Angioplasty;  Surgeon: Jettie Booze, MD;  Location: [place] [facility];  Service: Cardiovascular;  Laterality: N/A;   FEMUR IM NAIL Right 07/15/2012   Procedure: INTRAMEDULLARY (IM) NAIL HIP;  Surgeon: Johnny Bridge, MD;  Location: [place];  Service:  Orthopedics;  Laterality: Right;   FEMUR IM NAIL Left 12/02/2015   Procedure: INTRAMEDULLARY (IM) NAIL FEMORAL;  Surgeon: [name], MD;  Location: [place];  Service: Orthopedics;  Laterality: Left;   FRACTURE SURGERY     INCISION AND DRAINAGE HIP Left 01/03/2016   Procedure: IRRIGATION AND DEBRIDEMENT HIP;  Surgeon: [name], MD;  Location: [place];  Service: Orthopedics;  Laterality: Left;   SHOULDER SURGERY Bilateral    "don't remember which side"   Family History  Problem Relation Age of Onset   Diabetes Father    Heart attack Father 49   Diabetes Sister    Drug abuse Son    Cancer Neg Hx        no hx of colon or breast cancer   Social History   Socioeconomic History   Marital status: Married    Spouse name: [name]   Number of children: 1   Years of education: College   Highest education level: Not on file  Occupational History   Occupation: retired     Fish farm manager: RETIRED  Tobacco Use   Smoking status: Former    Pack years: 0.00    Types: Cigarettes    Quit date: 03/02/1978    Years since quitting: 42.5   Smokeless tobacco: Never  Vaping Use   Vaping Use: Never used  Substance and Sexual Activity   Alcohol use: No    Alcohol/week: 0.0 standard drinks   Drug use: No   Sexual activity: Not on file  Other Topics Concern   Not on file  Social History Narrative   Patient lives at home spouse. Uses a walker consistently.   Moved from [place] 2004   1 son, problems w/ drugs, passed away 03-03-16-- OD       Social Determinants of Health   Financial Resource Strain: Low Risk    Difficulty of Paying Living Expenses: Not hard at all  Food Insecurity: No Food Insecurity   Worried About Charity fundraiser in the Last Year: Never true   [place] in the Last Year: Never true  Transportation Needs: No Transportation Needs   Lack of Transportation (Medical): No   Lack of Transportation (Non-Medical): No  Physical Activity: Inactive   Days of Exercise per Week: 0 days    Minutes of Exercise per Session: 0 min  Stress: No Stress Concern Present   Feeling of Stress : Not at all  Social Connections: Moderately Integrated   Frequency of Communication with Friends and Family: More than three times a week   Frequency of Social Gatherings with Friends and Family: More than three times a week   Attends Religious Services: More than 4 times per year   Active Member of [employer] or Organizations: No   Attends Archivist Meetings: Never   Marital Status: Married    Tobacco Counseling Counseling given:  Not Answered   Clinical Intake:  Pre-visit preparation completed: Yes  Pain : No/denies pain     Nutritional Status: BMI of 19-24  Normal Nutritional Risks: None Diabetes: Yes CBG done?: No Did pt. bring in CBG monitor from home?: No  How often do you need to have someone help you when you read instructions, pamphlets, or other written materials from your doctor or pharmacy?: 1 - Never  Diabetes:  Is the patient diabetic?  Yes  If diabetic, was a CBG obtained today?  No  Did the patient bring in their glucometer from home?  No  How often do you monitor your CBG's? daily.   Financial Strains and Diabetes Management:  Are you having any financial strains with the device, your supplies or your medication? No .  Does the patient want to be seen by Chronic Care Management for management of their diabetes?  No  Would the patient like to be referred to a Nutritionist or for Diabetic Management?  No   Diabetic Exams:  Diabetic Eye Exam: . Overdue for diabetic eye exam. Pt has been advised about the importance in completing this exam. Patient advised to mae an appt.  Diabetic Foot Exam: Completed 06/12/2020.  Interpreter Needed?: No  Information entered by :: Caroleen Hamman LPN   Activities of Daily Living In your present state of health, do you have any difficulty performing the following activities: 08/22/2020 12/05/2019  Hearing? N N   Vision? N Y  Difficulty concentrating or making decisions? N N  Walking or climbing stairs? N Y  Dressing or bathing? N N  Doing errands, shopping? N Y  Conservation officer, nature and eating ? N -  Using the Toilet? N -  In the past six months, have you accidently leaked urine? N -  Do you have problems with loss of bowel control? N -  Managing your Medications? N -  Managing your Finances? N -  Housekeeping or managing your Housekeeping? N -  Some recent data might be hidden    Patient Care Team: Colon Branch, MD as PCP - General Skeet Latch, MD as PCP - Cardiology (Cardiology) Garvin Fila, MD as Consulting Physician (Neurology) Alyson Ingles Candee Furbish, MD as Consulting Physician (Urology) [name], MD as Consulting Physician (Orthopedic Surgery) Renato Shin, MD as Consulting Physician (Endocrinology) Michel Bickers, MD as Consulting Physician (Infectious Diseases) Evans Lance, MD as Consulting Physician (Cardiology) Joseph Art, OD as Consulting Physician (Optometry) Skeet Latch, MD as Attending Physician (Cardiology)  Indicate any recent Medical Services you may have received from other than [facility] providers in the past year (date may be approximate).     Assessment:   This is a routine wellness examination for [place].  Hearing/Vision screen Hearing Screening - Comments:: No issues Vision Screening - Comments:: Last eye exam-2020-[facility]  Dietary issues and exercise activities discussed: Current Exercise Habits: The patient does not participate in regular exercise at present, Exercise limited by: orthopedic condition(s)   Goals Addressed             This Visit's Progress    <enter goal here>   On track    Pt would like to be walking better with rolling chair walker only.         Depression Screen PHQ 2/9 Scores 08/22/2020 08/07/2020 02/27/2020 01/04/2019 09/07/2018 12/15/2017 03/03/2016  PHQ - 2 Score 0 0 0 0 0 0 0  PHQ- 9 Score - 3 - 0 0 -  -  Fall Risk Fall Risk  08/22/2020 08/07/2020 02/02/2020 01/25/2019 01/14/2018  Falls in the past year? 0 0 0 0 0  Comment - - - Emmi Telephone Survey: data to providers prior to load [employer] Telephone Survey: data to providers prior to load  Number falls in past yr: 0 0 0 - -  Injury with Fall? 0 0 0 - -  Risk Factor Category  - - - - -  Risk for fall due to : - - - - -  Follow up Falls prevention discussed Falls evaluation completed - - -    FALL RISK PREVENTION PERTAINING TO THE HOME:  Any stairs in or around the home? Yes  If so, are there any without handrails? No  Home free of loose throw rugs in walkways, pet beds, electrical cords, etc? Yes  Adequate lighting in your home to reduce risk of falls? Yes   ASSISTIVE DEVICES UTILIZED TO PREVENT FALLS:  Life alert? No  Use of a cane, walker or w/c? Yes  Grab bars in the bathroom? Yes  Shower chair or bench in shower? No  Elevated toilet seat or a handicapped toilet? No   TIMED UP AND GO:  Was the test performed? Yes .  Length of time to ambulate 10 feet: 12 sec.   Gait steady and fast with assistive device  Cognitive Function:Normal cognitive status assessed by direct observation by this Nurse Health Advisor. No abnormalities found.   MMSE - Mini Mental State Exam 03/03/2016 05/17/2014  Orientation to time 5 5  Orientation to Place 5 2  Registration 3 3  Attention/ Calculation 5 5  Recall 0 0  Language- name 2 objects 2 2  Language- repeat 1 1  Language- follow 3 step command 2 3  Language- read & follow direction 1 1  Write a sentence 1 1  Copy design 0 1  Total score 25 24        Immunizations Immunization History  Administered Date(s) Administered   Fluad Quad(high Dose 65+) 11/15/2018, 12/18/2019   Influenza Split 01/19/2012   Influenza, High Dose Seasonal PF 01/24/2013, 12/04/2014, 11/29/2015, 11/25/2016, 12/15/2017   Influenza,inj,Quad PF,6+ Mos 11/24/2013   Janssen (J&J) SARS-COV-2 Vaccination 06/30/2019,  02/09/2020   PFIZER Comirnaty(Gray Top)Covid-19 Tri-Sucrose Vaccine 08/07/2020   PPD Test 09/18/2015   Pneumococcal Conjugate-13 12/28/2014   Pneumococcal Polysaccharide-23 01/24/2013   Td 08/01/2007, 12/15/2010   Tdap 01/22/2014   Zoster, Live 03/03/2003, 05/28/2013    TDAP status: Up to date  Flu Vaccine status: Up to date  Pneumococcal vaccine status: Up to date  Covid-19 vaccine status: Completed vaccines  Qualifies for Shingles Vaccine? Yes   Zostavax completed Yes   Shingrix Completed?: No.    Education has been provided regarding the importance of this vaccine. Patient has been advised to call insurance company to determine out of pocket expense if they have not yet received this vaccine. Advised may also receive vaccine at local pharmacy or Health Dept. Verbalized acceptance and understanding.  Screening Tests Health Maintenance  Topic Date Due   Zoster Vaccines- Shingrix (1 of 2) Never done   URINE MICROALBUMIN  12/16/2018   OPHTHALMOLOGY EXAM  11/28/2019   MAMMOGRAM  04/09/2021 (Originally 08/14/2015)   INFLUENZA VACCINE  09/30/2020   HEMOGLOBIN A1C  12/12/2020   FOOT EXAM  06/12/2021   TETANUS/TDAP  01/23/2024   DEXA SCAN  Completed   COVID-19 Vaccine  Completed   Hepatitis C Screening  Completed   PNA vac Low Risk Adult  Completed   HPV VACCINES  Aged Out    Health Maintenance  Health Maintenance Due  Topic Date Due   Zoster Vaccines- Shingrix (1 of 2) Never done   URINE MICROALBUMIN  12/16/2018   OPHTHALMOLOGY EXAM  11/28/2019    Colorectal cancer screening: No longer required.   Mammogram status: Due-Declined  Bone Density status: Due-Declined  Lung Cancer Screening: (Low Dose CT Chest recommended if Age 78-80 years, 30 pack-year currently smoking OR have quit w/in 15years.) does not qualify.     Additional Screening:  Hepatitis C Screening: does not qualify  Vision Screening: Recommended annual ophthalmology exams for early detection of  glaucoma and other disorders of the eye. Is the patient up to date with their annual eye exam?  No  Who is the provider or what is the name of the office in which the patient attends annual eye exams? [facility]   Dental Screening: Recommended annual dental exams for proper oral hygiene  Community Resource Referral / Chronic Care Management: CRR required this visit?  No   CCM required this visit?  No      Plan:     I have personally reviewed and noted the following in the patient's chart:   Medical and social history Use of alcohol, tobacco or illicit drugs  Current medications and supplements including opioid prescriptions.  Functional ability and status Nutritional status Physical activity Advanced directives List of other physicians Hospitalizations, surgeries, and ER visits in previous 12 months Vitals Screenings to include cognitive, depression, and falls Referrals and appointments  In addition, I have reviewed and discussed with patient certain preventive protocols, quality metrics, and best practice recommendations. A written personalized care plan for preventive services as well as general preventive health recommendations were provided to patient.   Patient would like to access avs on mychart.   Marta Antu, LPN   [id number]  Nurse Health Advisor  Nurse Notes: None

## 2020-08-26 ENCOUNTER — Ambulatory Visit (INDEPENDENT_AMBULATORY_CARE_PROVIDER_SITE_OTHER): Payer: Medicare Other | Admitting: Pharmacist

## 2020-08-26 ENCOUNTER — Other Ambulatory Visit: Payer: Self-pay

## 2020-08-26 DIAGNOSIS — Z5181 Encounter for therapeutic drug level monitoring: Secondary | ICD-10-CM | POA: Diagnosis not present

## 2020-08-26 DIAGNOSIS — I482 Chronic atrial fibrillation, unspecified: Secondary | ICD-10-CM | POA: Diagnosis not present

## 2020-08-26 DIAGNOSIS — I480 Paroxysmal atrial fibrillation: Secondary | ICD-10-CM

## 2020-08-26 LAB — POCT INR: INR: 2 (ref 2.0–3.0)

## 2020-08-26 NOTE — Patient Instructions (Signed)
Description   - continue taking warfarin 1 tablet daily except for 1/2 tablet on Mondays. - Recheck in 4 weeks.   Coumadin Clinic. [phone].

## 2020-09-11 ENCOUNTER — Other Ambulatory Visit: Payer: Self-pay

## 2020-09-11 ENCOUNTER — Ambulatory Visit (INDEPENDENT_AMBULATORY_CARE_PROVIDER_SITE_OTHER): Payer: Medicare Other | Admitting: Endocrinology

## 2020-09-11 VITALS — BP 108/64 | HR 50 | Ht 69.0 in | Wt 162.5 lb

## 2020-09-11 DIAGNOSIS — E1122 Type 2 diabetes mellitus with diabetic chronic kidney disease: Secondary | ICD-10-CM

## 2020-09-11 DIAGNOSIS — N1832 Chronic kidney disease, stage 3b: Secondary | ICD-10-CM | POA: Diagnosis not present

## 2020-09-11 DIAGNOSIS — E1159 Type 2 diabetes mellitus with other circulatory complications: Secondary | ICD-10-CM | POA: Diagnosis not present

## 2020-09-11 DIAGNOSIS — Z794 Long term (current) use of insulin: Secondary | ICD-10-CM | POA: Diagnosis not present

## 2020-09-11 LAB — POCT GLYCOSYLATED HEMOGLOBIN (HGB A1C): Hemoglobin A1C: 9 % — AB (ref 4.0–5.6)

## 2020-09-11 MED ORDER — NOVOLIN 70/30 RELION (70-30) 100 UNIT/ML ~~LOC~~ SUSP: SUBCUTANEOUS | 3 refills | Status: DC

## 2020-09-11 NOTE — Progress Notes (Signed)
Subjective:    Patient ID: [name] [name], female    DOB: October 01, 1940, 80 y.o.   MRN: [id number]  HPI Pt returns for f/u of diabetes mellitus:   DM type: Insulin-requiring type 2.  Dx'ed: 0000000 Complications: PN, CAD, stage 3b CRI, and DR.  Therapy: insulin since soon after dx.  GDM: never.  DKA: never.  Severe hypoglycemia: last episode was in mid-2018.  Pancreatitis: never.  SDOH: She takes human insulin, due to cost.  Other: she is on BID premixed insulin, due to noncompliance with multiple daily injections.    Interval history: no cbg record, but states cbg's vary from 89-182.  It is in general higher as the day goes on.  pt states she feels well in general.  She takes 19 units with breakfast and 5 units with the evening meal.   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   HYPERLIPIDEMIA 03/27/2006   HYPERTENSION 03/27/2006   Intertrochanteric fracture of right hip ([place]) 07/13/2012   LUNG NODULE 09/01/2006   Excision, Bx Benighn   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"   Osteopenia 2004   Dexa 2004 showed Osteopenia, DEXA 03/2007 normal   Osteopenia    Other chronic cystitis with hematuria    Pelvic fracture ([place]) 06/16/2016   S/P fall   Recurrent urinary tract infection    Seeing Urology   RETINOPATHY, BACKGROUND NOS 03/27/2006   Seizures ([place])    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[place]) Q000111Q    Past Surgical History:  Procedure Laterality Date   ANKLE FRACTURE SURGERY Bilateral 08/2015   "right one was in 2 places"   CARDIAC  CATHETERIZATION N/A 05/17/2015   Procedure: Left Heart Cath and Coronary Angiography;  Surgeon: Jettie Booze, MD;  Location: [facility];  Service: Cardiovascular;  Laterality: N/A;   CARDIAC CATHETERIZATION N/A 05/17/2015   Procedure: Coronary Balloon Angioplasty;  Surgeon: Jettie Booze, MD;  Location: [facility];  Service: Cardiovascular;  Laterality: N/A;   FEMUR IM NAIL Right 07/15/2012   Procedure: INTRAMEDULLARY (IM) NAIL HIP;  Surgeon: Johnny Bridge, MD;  Location: [facility];  Service: Orthopedics;  Laterality: Right;   FEMUR IM NAIL Left 12/02/2015   Procedure: INTRAMEDULLARY (IM) NAIL FEMORAL;  Surgeon: [name], MD;  Location: [facility];  Service: Orthopedics;  Laterality: Left;   FRACTURE SURGERY     INCISION AND DRAINAGE HIP Left 01/03/2016   Procedure: IRRIGATION AND DEBRIDEMENT HIP;  Surgeon: [name], MD;  Location: [facility];  Service: Orthopedics;  Laterality: Left;   SHOULDER SURGERY Bilateral    "don't remember which side"    Social History   Socioeconomic History   Marital status: Married    Spouse name: [name]   Number of children: 1   Years of education: College   Highest education level: Not on file  Occupational History   Occupation: retired     Fish farm manager: RETIRED  Tobacco Use   Smoking  status: Former    Types: Cigarettes    Quit date: 03/02/1978    Years since quitting: 42.5   Smokeless tobacco: Never  Vaping Use   Vaping Use: Never used  Substance and Sexual Activity   Alcohol use: No    Alcohol/week: 0.0 standard drinks   Drug use: No   Sexual activity: Not on file  Other Topics Concern   Not on file  Social History Narrative   Patient lives at home spouse. Uses a walker consistently.   Moved from [place] 2004   1 son, problems w/ drugs, passed away February 26, 2016-- OD       Social Determinants of Health   Financial Resource Strain: Low Risk    Difficulty of Paying Living Expenses: Not hard at all  Food Insecurity: No Food Insecurity    Worried About Charity fundraiser in the Last Year: Never true   [place] in the Last Year: Never true  Transportation Needs: No Transportation Needs   Lack of Transportation (Medical): No   Lack of Transportation (Non-Medical): No  Physical Activity: Inactive   Days of Exercise per Week: 0 days   Minutes of Exercise per Session: 0 min  Stress: No Stress Concern Present   Feeling of Stress : Not at all  Social Connections: Moderately Integrated   Frequency of Communication with Friends and Family: More than three times a week   Frequency of Social Gatherings with Friends and Family: More than three times a week   Attends Religious Services: More than 4 times per year   Active Member of [employer] or Organizations: No   Attends Archivist Meetings: Never   Marital Status: Married  Human resources officer Violence: Not At Risk   Fear of Current or Ex-Partner: No   Emotionally Abused: No   Physically Abused: No   Sexually Abused: No    Current Outpatient Medications on File Prior to Visit  Medication Sig Dispense Refill   atorvastatin (LIPITOR) 80 MG tablet Take 1 tablet by mouth once daily 90 tablet 0   calcium-vitamin D (OSCAL WITH D) 500-200 MG-UNIT per tablet Take 1 tablet by mouth daily.     furosemide (LASIX) 40 MG tablet Take 1 tablet (40 mg total) by mouth 2 (two) times daily as needed for fluid. 180 tablet 0   Multiple Vitamins-Minerals (MULTIVITAMIN ADULT PO) Take 1 tablet by mouth daily with breakfast.      warfarin (COUMADIN) 2.5 MG tablet TAKE 1 to 2 tablets daily or as directed by Coumadin clinic 60 tablet 0   No current facility-administered medications on file prior to visit.    Allergies  Allergen Reactions   Procaine Hcl Anaphylaxis   Amoxicillin Itching     Did it involve swelling of the face/tongue/throat, SOB, or low BP? No Did it involve sudden or severe rash/hives, skin peeling, or any reaction on the inside of your mouth or nose? No Did you need  to seek medical attention at a hospital or doctor's office? No When did it last happen? unk If all above answers are "NO", may proceed with cephalosporin use.     Family History  Problem Relation Age of Onset   Diabetes Father    Heart attack Father 71   Diabetes Sister    Drug abuse Son    Cancer Neg Hx        no hx of colon or breast cancer    BP 108/64   Pulse (!) 50  Ht '5\' 9"'$  (1.753 m)   Wt 162 lb 8 oz (73.7 kg)   SpO2 (!) 89%   BMI 24.00 kg/m    Review of Systems     Objective:   Physical Exam Pulses: dorsalis pedis intact bilat.   MSK: no deformity of the feet CV: no leg edema Skin:  no ulcer on the feet.  normal color and temp on the feet. Neuro: sensation is intact to touch on the feet Ext: there is bilateral onychomycosis of the toenails.   A1c=9.0%     Assessment & Plan:  Insulin-requiring type 2 DM: uncontrolled.    Patient Instructions  Please increase the insulin to 25 units with breakfast and 3 units with the evening meal  On this type of insulin schedule, you should eat meals on a regular schedule.  If a meal is missed or significantly delayed, your blood sugar could go low.   Please come back for a follow-up appointment in 3 months.   check your blood sugar twice a day.  vary the time of day when you check, between before the 3 meals, and at bedtime.  also check if you have symptoms of your blood sugar being too high or too low.  please keep a record of the readings and bring it to your next appointment here.  please call us sooner if your blood sugar goes below 70, or if you have a lot of readings over 200.

## 2020-09-11 NOTE — Patient Instructions (Addendum)
Please increase the insulin to 25 units with breakfast and 3 units with the evening meal  On this type of insulin schedule, you should eat meals on a regular schedule.  If a meal is missed or significantly delayed, your blood sugar could go low.   Please come back for a follow-up appointment in 3 months.   check your blood sugar twice a day.  vary the time of day when you check, between before the 3 meals, and at bedtime.  also check if you have symptoms of your blood sugar being too high or too low.  please keep a record of the readings and bring it to your next appointment here.  please call us sooner if your blood sugar goes below 70, or if you have a lot of readings over 200.

## 2020-09-23 ENCOUNTER — Other Ambulatory Visit: Payer: Self-pay

## 2020-09-23 ENCOUNTER — Ambulatory Visit (INDEPENDENT_AMBULATORY_CARE_PROVIDER_SITE_OTHER): Payer: Medicare Other | Admitting: *Deleted

## 2020-09-23 DIAGNOSIS — I482 Chronic atrial fibrillation, unspecified: Secondary | ICD-10-CM | POA: Diagnosis not present

## 2020-09-23 DIAGNOSIS — I480 Paroxysmal atrial fibrillation: Secondary | ICD-10-CM | POA: Diagnosis not present

## 2020-09-23 DIAGNOSIS — Z5181 Encounter for therapeutic drug level monitoring: Secondary | ICD-10-CM | POA: Diagnosis not present

## 2020-09-23 LAB — PROTIME-INR
INR: 5.6 (ref 0.9–1.2)
Prothrombin Time: 52.4 s — ABNORMAL HIGH (ref 9.1–12.0)

## 2020-09-23 LAB — POCT INR: INR: 6.1 — AB (ref 2.0–3.0)

## 2020-09-23 NOTE — Patient Instructions (Addendum)
Description   Spoke with pt and instructed pt to hold today's dose of warfarin, hold tomorrow's dose of warfarin, and hold Wednesday's dose of warfarin then continue taking Warfarin 1 tablet daily except for 1/2 tablet on Mondays. Report to ER with any bleeding, falls, or accidents. Recheck INR on 10/02/2020 with Dr. Caron Presume Appt at [place] location. Coumadin Clinic [phone]

## 2020-10-02 ENCOUNTER — Other Ambulatory Visit: Payer: Self-pay | Admitting: Cardiovascular Disease

## 2020-10-02 ENCOUNTER — Ambulatory Visit (INDEPENDENT_AMBULATORY_CARE_PROVIDER_SITE_OTHER): Payer: Medicare Other | Admitting: Physician Assistant

## 2020-10-02 ENCOUNTER — Encounter: Payer: Self-pay | Admitting: Physician Assistant

## 2020-10-02 ENCOUNTER — Other Ambulatory Visit: Payer: Self-pay

## 2020-10-02 ENCOUNTER — Ambulatory Visit (INDEPENDENT_AMBULATORY_CARE_PROVIDER_SITE_OTHER): Payer: Medicare Other

## 2020-10-02 VITALS — BP 120/60 | HR 117 | Ht 69.5 in | Wt 161.2 lb

## 2020-10-02 DIAGNOSIS — I4891 Unspecified atrial fibrillation: Secondary | ICD-10-CM | POA: Diagnosis not present

## 2020-10-02 DIAGNOSIS — I480 Paroxysmal atrial fibrillation: Secondary | ICD-10-CM

## 2020-10-02 DIAGNOSIS — I255 Ischemic cardiomyopathy: Secondary | ICD-10-CM

## 2020-10-02 DIAGNOSIS — E782 Mixed hyperlipidemia: Secondary | ICD-10-CM | POA: Diagnosis not present

## 2020-10-02 DIAGNOSIS — Z9861 Coronary angioplasty status: Secondary | ICD-10-CM

## 2020-10-02 DIAGNOSIS — Z5181 Encounter for therapeutic drug level monitoring: Secondary | ICD-10-CM | POA: Diagnosis not present

## 2020-10-02 DIAGNOSIS — I2510 Atherosclerotic heart disease of native coronary artery without angina pectoris: Secondary | ICD-10-CM | POA: Diagnosis not present

## 2020-10-02 DIAGNOSIS — I482 Chronic atrial fibrillation, unspecified: Secondary | ICD-10-CM | POA: Diagnosis not present

## 2020-10-02 DIAGNOSIS — I10 Essential (primary) hypertension: Secondary | ICD-10-CM | POA: Diagnosis not present

## 2020-10-02 DIAGNOSIS — Z01818 Encounter for other preprocedural examination: Secondary | ICD-10-CM

## 2020-10-02 LAB — POCT INR: INR: 1.5 — AB (ref 2.0–3.0)

## 2020-10-02 MED ORDER — METOPROLOL SUCCINATE ER 50 MG PO TB24: 50.0000 mg | ORAL_TABLET | Freq: Every day | ORAL | 5 refills | Status: DC

## 2020-10-02 MED ORDER — EZETIMIBE 10 MG PO TABS: 10.0000 mg | ORAL_TABLET | Freq: Every day | ORAL | 5 refills | Status: DC

## 2020-10-02 NOTE — Patient Instructions (Addendum)
Medication Instructions:  START Zetia 10 mg daily START Metoprolol Succinate (Toprol-XL) 50 mg daily   *If you need a refill on your cardiac medications before your next appointment, please call your pharmacy*  Lab Work: Your physician recommends that you return for lab work 1 week prior to Cardioversion:  BMET CBC If you have labs (blood work) drawn today and your tests are completely normal, you will receive your results only by: [employer] (if you have MyChart) OR A paper copy in the mail If you have any lab test that is abnormal or we need to change your treatment, we will call you to review the results.  Testing/Procedures:  Your physician has order for you to have a [place] has requested that you have an echocardiogram. Echocardiography is a painless test that uses sound waves to create images of your heart. It provides your doctor with information about the size and shape of your heart and how well your heart's chambers and valves are working. This procedure takes approximately one hour. There are no restrictions for this procedure. This test is performed at [facility], [place], [place] [address]  Please schedule for 1 month at [place]: At [facility], you and your health needs are our priority.  As part of our continuing mission to provide you with exceptional heart care, we have created designated Provider Care Teams.  These Care Teams include your primary Cardiologist (physician) and Advanced Practice Providers (APPs -  Physician Assistants and Nurse Practitioners) who all work together to provide you with the care you need, when you need it.  Your next appointment:   2-3 weeks post Cardioversion   The format for your next appointment:   In Person  Provider:   Caron Presume, PA-C  Other Instructions You have been referred to Cardiac Electrophysiology    Dear [name] [name],  You are scheduled for  a TEE/Cardioversion/TEE Cardioversion on Wednesday 10/30/2020 with Dr. Fransico Him.  Please arrive at the [facility] (Main Entrance A) at [facility]: [address] [place], [place] [address] at 8:00 AM. (1 hour prior to procedure unless lab work is needed; if lab work is needed arrive 1.5 hours ahead)  DIET: Nothing to eat or drink after midnight except a sip of water with medications (see medication instructions below)  FYI: For your safety, and to allow [place] to monitor your vital signs accurately during the surgery/procedure we request that   if you have artificial nails, gel coating, SNS etc. Please have those removed prior to your surgery/procedure. Not having the nail coverings /polish removed may result in cancellation or delay of your surgery/procedure.   Medication Instructions:   Continue your anticoagulant: Coumadin  You will need to continue your anticoagulant after your procedure until you  are told by your  Provider that it is safe to stop   Labs: If patient is on Coumadin, patient needs pt/INR, CBC, BMET within 3 days (No pt/INR needed for patients taking Xarelto, Eliquis, Pradaxa) For patients receiving anesthesia for TEE and all Cardioversion patients: BMET, CBC within 1 week  Come to:  (Lab option #1) [facility] between the hours of 8:00 am and 4:30 pm. You do not have to be fasting. (Lab option #2) Lab at an alternate location (Lab option #3) your lab work will be done at the hospital prior to your procedure - you will need to arrive 1  hours ahead of your procedure  You must have a responsible person to drive you home and stay in the waiting area during your procedure. Failure to do so could result in cancellation.  Bring your insurance cards.  *Special Note: Every effort is made to have your procedure done on time. Occasionally there are emergencies that occur at the hospital that may cause delays. Please be patient if a delay does occur.

## 2020-10-02 NOTE — Telephone Encounter (Signed)
Prescription refill request received for warfarin Lov: lambert today  Next INR check: 8/10 Warfarin tablet strength:2.5 Warfarin schedule: continue taking Warfarin 1 tablet daily except for 1/2 tablet on Mondays.

## 2020-10-02 NOTE — Progress Notes (Signed)
Cardiology Office Note:    Date:  10/02/2020   ID:  [name] [name], [name] [name] 27-Nov-1940, MRN [id number]  PCP:  Colon Branch, MD Referring MD: Colon Branch, [place]  Cardiologist:  Skeet Latch, MD   Reason for visit: 1 year follow-up  History of Present Illness:    [name] [name] is a 80 y.o. female with a hx of CAD with NSTEMI in 05/2015 s/p POBA to OM1, ischemic cardiomyopathy, paroxysmal atrial fibrillation/flutter s/p remote flutter ablation on Coumadin, SVT, hypertension, hyperlipidemia, and type 2 diabetes mellitus, GERD, CKD stage III, and seizure disorder who is followed by Dr. Oval Linsey and presents today for follow-up.  She last was evaluated by our office via telemedicine visit in April 2021 and was doing well.  Today, the patient mentions she has had a couple episodes of chest discomfort.  She had a significant episode in December 2021 with substernal chest pain that occurred after she was bending over and picking up her great granddaughter who is 31 years old.  This pain lasted a couple hours.  She denies associated shortness of breath and lightheadedness.  She occasionally gets mild chest pain when picking things up.  She mentions another episode that occurred this past Saturday.  She denies syncope and bleeding.  She follows up with the Coumadin clinic.  She uses a walker for ambulation.  No recent falls.  She lives with her husband in an extended family.  She denies palpitations and states she is unaware when she is out of rhythm.  She has not had to use Lasix in the past month.  She denies orthopnea, PND and lower extremity edema.  No weight gain or bloating.   Prior CV studies:  LEFT HEART CATH AND CORONARY ANGIOGRAPHY, LEFT HEART CATH AND CORONARY ANGIOGRAPHY 05/17/2015 Narrative  There is moderate left ventricular systolic dysfunction.  1st Mrg lesion, 100% stenosed. Intervention was performed with balloon angioplasty. No stent was  placed.  At the end of the procedure, the initial occlusion site had no residual stenosis. There did remain a large branch downstream of the initial occlusion which remained occluded and did not respond to angioplasty. There is a smaller branch which had TIMI-3 flow restored. The patient was pain-free at the end of the procedure.  Moderate LV dysfunction. Moderately elevated LVEDP.  Will stop anticoagulation at this point. Oral antiplatelet therapy was not given due to this vague history of rectal bleeding. No stent was placed. If there is no contraindication of bleeding, would load the patient with clopidogrel 300 mg followed by 75 mg daily. She will also have to go back on Coumadin. Risk-benefit decision regarding additional antiplatelet therapy will have to be performed.  Continue aggressive secondary prevention including high-dose statin and therapy for LV dysfunction.  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   HYPERLIPIDEMIA 03/27/2006   HYPERTENSION 03/27/2006   Intertrochanteric fracture of right hip ([place]) 07/13/2012   LUNG NODULE 09/01/2006  Excision, Bx Benighn   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"   Osteopenia 2004   Dexa 2004 showed Osteopenia, DEXA 03/2007 normal   Osteopenia    Other chronic cystitis with hematuria    Pelvic fracture ([place]) 06/16/2016   S/P fall   Recurrent urinary tract infection    Seeing Urology   RETINOPATHY, BACKGROUND NOS 03/27/2006   Seizures ([place])    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[place]) Q000111Q    Past  Surgical History:  Procedure Laterality Date   ANKLE FRACTURE SURGERY Bilateral 08/2015   "right one was in 2 places"   CARDIAC CATHETERIZATION N/A 05/17/2015   Procedure: Left Heart Cath and Coronary Angiography;  Surgeon: Jettie Booze, MD;  Location: [facility];  Service: Cardiovascular;  Laterality: N/A;   CARDIAC CATHETERIZATION N/A 05/17/2015   Procedure: Coronary Balloon Angioplasty;  Surgeon: Jettie Booze, MD;  Location: [facility];  Service: Cardiovascular;  Laterality: N/A;   FEMUR IM NAIL Right 07/15/2012   Procedure: INTRAMEDULLARY (IM) NAIL HIP;  Surgeon: Johnny Bridge, MD;  Location: [facility];  Service: Orthopedics;  Laterality: Right;   FEMUR IM NAIL Left 12/02/2015   Procedure: INTRAMEDULLARY (IM) NAIL FEMORAL;  Surgeon: [name], MD;  Location: [facility];  Service: Orthopedics;  Laterality: Left;   FRACTURE SURGERY     INCISION AND DRAINAGE HIP Left 01/03/2016   Procedure: IRRIGATION AND DEBRIDEMENT HIP;  Surgeon: [name], MD;  Location: [facility];  Service: Orthopedics;  Laterality: Left;   SHOULDER SURGERY Bilateral    "don't remember which side"    Current Medications: Current Meds  Medication Sig   atorvastatin (LIPITOR) 80 MG tablet Take 1 tablet by mouth once daily   calcium-vitamin D (OSCAL WITH D) 500-200 MG-UNIT per tablet Take 1 tablet by mouth daily.   furosemide (LASIX) 40 MG tablet Take 1 tablet (40 mg total) by mouth 2 (two) times daily as needed for fluid.   insulin NPH-regular Human (NOVOLIN 70/30 RELION) (70-30) 100 UNIT/ML injection 25 units with breakfast and 3 units with the evening meal.   Multiple Vitamins-Minerals (MULTIVITAMIN ADULT PO) Take 1 tablet by mouth daily with breakfast.    warfarin (COUMADIN) 2.5 MG tablet TAKE 1 to 2 tablets daily or as directed by Coumadin clinic     Allergies:   Procaine hcl and Amoxicillin   Social History   Socioeconomic History   Marital status: Married    Spouse name: [name]   Number  of children: 1   Years of education: College   Highest education level: Not on file  Occupational History   Occupation: retired     Fish farm manager: RETIRED  Tobacco Use   Smoking status: Former    Types: Cigarettes    Quit date: 03/02/1978    Years since quitting: 42.6   Smokeless tobacco: Never  Vaping Use   Vaping Use: Never used  Substance and Sexual Activity   Alcohol use: No    Alcohol/week: 0.0 standard drinks   Drug use: No   Sexual activity: Not on file  Other Topics Concern   Not on file  Social History Narrative   Patient lives at home spouse. Uses a walker consistently.   Moved from [place] 2004   1 son, problems w/ drugs, passed away 25-Feb-2016-- OD       Social Determinants of Health   Financial Resource Strain: Low Risk    Difficulty of Paying Living Expenses: Not hard  at all  Food Insecurity: No Food Insecurity   Worried About Charity fundraiser in the Last Year: Never true   Ran Out of Food in the Last Year: Never true  Transportation Needs: No Transportation Needs   Lack of Transportation (Medical): No   Lack of Transportation (Non-Medical): No  Physical Activity: Inactive   Days of Exercise per Week: 0 days   Minutes of Exercise per Session: 0 min  Stress: No Stress Concern Present   Feeling of Stress : Not at all  Social Connections: Moderately Integrated   Frequency of Communication with Friends and Family: More than three times a week   Frequency of Social Gatherings with Friends and Family: More than three times a week   Attends Religious Services: More than 4 times per year   Active Member of [employer] or Organizations: No   Attends Archivist Meetings: Never   Marital Status: Married     Family History: The patient's family history includes Diabetes in her father and sister; Drug abuse in her son; Heart attack (age of onset: 12) in her father. There is no history of Cancer.  ROS:   Please see the history of present illness.     EKGs/Labs/Other  Studies Reviewed:    EKG:  The ekg ordered today demonstrates A. fib with RVR, heart rate 117, QRS 94 ms.  Recent Labs: 05/02/2020: ALT 11; BUN 28; Creatinine 1.7; Hemoglobin 12.7; Platelets 353; Potassium 4.7; Sodium 140 08/07/2020: TSH 1.20  Recent Lipid Panel    Component Value Date/Time   CHOL 216 (H) 08/07/2020 1111   TRIG 139.0 08/07/2020 1111   HDL 49.80 08/07/2020 1111   CHOLHDL 4 08/07/2020 1111   VLDL 27.8 08/07/2020 1111   LDLCALC 138 (H) 08/07/2020 1111    Physical Exam:    VS:  BP 120/60   Pulse (!) 117   Ht 5' 9.5" (1.765 m)   Wt 161 lb 3.2 oz (73.1 kg)   BMI 23.46 kg/m     Wt Readings from Last 3 Encounters:  10/02/20 161 lb 3.2 oz (73.1 kg)  09/11/20 162 lb 8 oz (73.7 kg)  08/22/20 163 lb 3.2 oz (74 kg)     GEN:  Well nourished, well developed in no acute distress HEENT: Normal NECK: No JVD; No carotid bruits CARDIAC: RRR, no murmurs, rubs, gallops RESPIRATORY:  Clear to auscultation without rales, wheezing or rhonchi  ABDOMEN: Soft, non-tender, non-distended MUSCULOSKELETAL: No edema; No deformity  SKIN: Warm and dry NEUROLOGIC:  Alert and oriented PSYCHIATRIC:  Normal affect   ASSESSMENT AND PLAN   Paroxysmal Atrial Fibrillation/ Atrial Flutter s/p Ablation / History of SVT - Currently in A-fib with RVR - Start Toprol 50 mg daily for rate control. - Referred for cardioversion in 4 weeks since INR was not in goal today. - Continue chronic anticoagulation with Coumadin.  She states she has been on Coumadin for 25 years and therefore has not considered changing to a DOAC. -Refer to EP to determine if further A. fib management is warranted.  CAD - History of NSTEMI in 05/2015 treated with POBA to OM1. - Wonder if her chest pain is related to A. fib with RVR.  If chest pain continues post cardioversion, would consider stress testing. -  No aspirin given anticoagulation.  New start metoprolol. - Optimize lipids.   Ischemic Cardiomyopathy, euvolemic -  Echo in 2017 showed LVEF of 35-40% with diffuse hypokinesis. - Continue Lasix '40mg'$  PRN.   - Repeat  echo  Hyperlipidemia, poorly controlled -LDL 138 in June 2022. -Continue Lipitor 80 mg daily -Add Zetia 10 mg daily -Recheck lipids in 2 months.  Disposition - Follow-up in 4 weeks.     Shared Decision Making/Informed Consent The risks (stroke, cardiac arrhythmias rarely resulting in the need for a temporary or permanent pacemaker, skin irritation or burns and complications associated with conscious sedation including aspiration, arrhythmia, respiratory failure and death), benefits (restoration of normal sinus rhythm) and alternatives of a direct current cardioversion were explained in detail to Ms. Floyce Stakes and she agrees to proceed.    Medication Adjustments/Labs and Tests Ordered: Current medicines are reviewed at length with the patient today.  Concerns regarding medicines are outlined above.  No orders of the defined types were placed in this encounter.  No orders of the defined types were placed in this encounter.    Signed, Warren Lacy, PA-C  10/02/2020 11:50 AM    [facility]

## 2020-10-02 NOTE — Patient Instructions (Signed)
Take 2 tablets tonight only and then continue taking Warfarin 1 tablet daily except for 1/2 tablet on Mondays.  Coumadin Clinic [phone]

## 2020-10-09 ENCOUNTER — Ambulatory Visit (INDEPENDENT_AMBULATORY_CARE_PROVIDER_SITE_OTHER): Payer: Medicare Other

## 2020-10-09 ENCOUNTER — Other Ambulatory Visit: Payer: Self-pay

## 2020-10-09 DIAGNOSIS — I482 Chronic atrial fibrillation, unspecified: Secondary | ICD-10-CM

## 2020-10-09 DIAGNOSIS — I480 Paroxysmal atrial fibrillation: Secondary | ICD-10-CM

## 2020-10-09 DIAGNOSIS — Z5181 Encounter for therapeutic drug level monitoring: Secondary | ICD-10-CM

## 2020-10-09 LAB — POCT INR: INR: 4.1 — AB (ref 2.0–3.0)

## 2020-10-09 NOTE — Patient Instructions (Signed)
Description   Eat greens and hold Warfarin tonight and then continue taking 2 tablets tonight only and then continue taking Warfarin 1 tablet daily except for 1/2 tablet on Mondays. Coumadin Clinic [phone]

## 2020-10-16 ENCOUNTER — Other Ambulatory Visit: Payer: Self-pay

## 2020-10-16 ENCOUNTER — Ambulatory Visit (INDEPENDENT_AMBULATORY_CARE_PROVIDER_SITE_OTHER): Payer: Medicare Other

## 2020-10-16 DIAGNOSIS — I480 Paroxysmal atrial fibrillation: Secondary | ICD-10-CM

## 2020-10-16 DIAGNOSIS — Z5181 Encounter for therapeutic drug level monitoring: Secondary | ICD-10-CM | POA: Diagnosis not present

## 2020-10-16 DIAGNOSIS — I482 Chronic atrial fibrillation, unspecified: Secondary | ICD-10-CM | POA: Diagnosis not present

## 2020-10-16 LAB — POCT INR: INR: 3 (ref 2.0–3.0)

## 2020-10-16 NOTE — Patient Instructions (Signed)
Description   Continue taking Warfarin 1 tablet daily except for 1/2 tablet on Mondays. INR in 3 weeks. [facility] [phone]

## 2020-10-21 ENCOUNTER — Other Ambulatory Visit: Payer: Self-pay | Admitting: Internal Medicine

## 2020-10-23 ENCOUNTER — Telehealth: Payer: Self-pay | Admitting: Cardiovascular Disease

## 2020-10-23 LAB — BASIC METABOLIC PANEL
BUN/Creatinine Ratio: 18 (ref 12–28)
BUN: 29 mg/dL — ABNORMAL HIGH (ref 8–27)
CO2: 26 mmol/L (ref 20–29)
Calcium: 9.7 mg/dL (ref 8.7–10.3)
Chloride: 101 mmol/L (ref 96–106)
Creatinine, Ser: 1.57 mg/dL — ABNORMAL HIGH (ref 0.57–1.00)
Glucose: 52 mg/dL — ABNORMAL LOW (ref 65–99)
Potassium: 4.2 mmol/L (ref 3.5–5.2)
Sodium: 142 mmol/L (ref 134–144)
eGFR: 34 mL/min/{1.73_m2} — ABNORMAL LOW (ref >59)

## 2020-10-23 LAB — CBC
Hematocrit: 39.1 % (ref 34.0–46.6)
Hemoglobin: 13.1 g/dL (ref 11.1–15.9)
MCH: 30.2 pg (ref 26.6–33.0)
MCHC: 33.5 g/dL (ref 31.5–35.7)
MCV: 90 fL (ref 79–97)
Platelets: 356 10*3/uL (ref 150–450)
RBC: 4.34 x10E6/uL (ref 3.77–5.28)
RDW: 12.2 % (ref 11.7–15.4)
WBC: 9.6 10*3/uL (ref 3.4–10.8)

## 2020-10-23 NOTE — Telephone Encounter (Signed)
Spoke with the patient's husband and gave him the time that the patient needs to arrive for her cardioversion.

## 2020-10-23 NOTE — Telephone Encounter (Signed)
   Pt's spouse would like to get information of the pt's upcoming procedure

## 2020-10-30 ENCOUNTER — Encounter (HOSPITAL_COMMUNITY): Payer: Self-pay | Admitting: Cardiology

## 2020-10-30 ENCOUNTER — Encounter (HOSPITAL_COMMUNITY)
Admission: RE | Disposition: A | Discharge status: Home / Self Care | Payer: Self-pay | Source: Home / Self Care | Attending: Cardiology

## 2020-10-30 ENCOUNTER — Encounter (HOSPITAL_COMMUNITY): Payer: Self-pay | Admitting: Certified Registered Nurse Anesthetist

## 2020-10-30 ENCOUNTER — Telehealth: Payer: Self-pay

## 2020-10-30 ENCOUNTER — Ambulatory Visit (HOSPITAL_COMMUNITY)
Admission: RE | Admit: 2020-10-30 | Discharge: 2020-10-30 | Disposition: A | Discharge status: Home / Self Care | Payer: Medicare Other | Attending: Cardiology | Admitting: Cardiology

## 2020-10-30 DIAGNOSIS — I4891 Unspecified atrial fibrillation: Secondary | ICD-10-CM | POA: Diagnosis present

## 2020-10-30 DIAGNOSIS — Z538 Procedure and treatment not carried out for other reasons: Secondary | ICD-10-CM | POA: Insufficient documentation

## 2020-10-30 SURGERY — CANCELLED PROCEDURE

## 2020-10-30 NOTE — Progress Notes (Signed)
Patient presented to [facility] for DCCV for atrial fibrillation and was found to be back in NSR by EKG and tele.  Procedure cancelled and patient discharged home.

## 2020-10-30 NOTE — Telephone Encounter (Addendum)
Called patient regarding blood test results----- Message from Warren Lacy, PA-C sent at 10/30/2020  3:36 PM EDT ----- Your kidney function is improved.  Sodium and potassium are normal.  Blood counts are normal.  This is great :)

## 2020-11-06 ENCOUNTER — Ambulatory Visit (INDEPENDENT_AMBULATORY_CARE_PROVIDER_SITE_OTHER): Payer: Medicare Other | Admitting: Pharmacist

## 2020-11-06 ENCOUNTER — Ambulatory Visit (HOSPITAL_COMMUNITY): Payer: Medicare Other | Attending: Cardiovascular Disease

## 2020-11-06 ENCOUNTER — Other Ambulatory Visit: Payer: Self-pay

## 2020-11-06 DIAGNOSIS — Z5181 Encounter for therapeutic drug level monitoring: Secondary | ICD-10-CM

## 2020-11-06 DIAGNOSIS — I480 Paroxysmal atrial fibrillation: Secondary | ICD-10-CM | POA: Diagnosis present

## 2020-11-06 DIAGNOSIS — I482 Chronic atrial fibrillation, unspecified: Secondary | ICD-10-CM | POA: Diagnosis not present

## 2020-11-06 LAB — ECHOCARDIOGRAM COMPLETE
Area-P 1/2: 2.07 cm2
S' Lateral: 2.7 cm

## 2020-11-06 LAB — POCT INR: INR: 4 — AB (ref 2.0–3.0)

## 2020-11-06 NOTE — Patient Instructions (Signed)
Hold warfarin today then start taking Warfarin 1 tablet daily except for 1/2 tablet on Mondays and Fridays. INR in 2 weeks. Cou[facility]in Clinic [phone]

## 2020-11-07 ENCOUNTER — Telehealth: Payer: Self-pay

## 2020-11-07 NOTE — Telephone Encounter (Addendum)
Spoke with patient regarding echocardiogram results----- Message from Warren Lacy, PA-C sent at 11/07/2020 10:21 AM EDT ----- Heart function has improved since 2017.  Ejection fraction was 35 to 40% in 2017 and now is 45 to 50%. (Normal is 55%)  Normal right heart function.  No significant valve disease.    This is good news.  Continue Lasix as needed.[name]

## 2020-11-11 ENCOUNTER — Other Ambulatory Visit (HOSPITAL_BASED_OUTPATIENT_CLINIC_OR_DEPARTMENT_OTHER): Payer: Self-pay

## 2020-11-13 NOTE — Progress Notes (Signed)
Cardiology Office Note:    Date:  11/14/2020   ID:  [name] [name], [name] [name] 1940/03/23, MRN [id number]  PCP:  Colon Branch, MD [place] Cardiologist: Skeet Latch, MD   Reason for visit: 2-week follow-up post cardioversion  History of Present Illness:    [place] is a 80 y.o. female with a hx of CAD with NSTEMI in 05/2015 s/p POBA to OM1, ischemic cardiomyopathy, paroxysmal atrial fibrillation/flutter s/p remote flutter ablation on Coumadin, SVT, hypertension, hyperlipidemia, and type 2 diabetes mellitus, GERD, CKD stage III, and seizure disorder who is followed by Dr. Oval Linsey.   She saw me on October 02, 2020 and mentioned episodes of chest discomfort particularly after bending over and picking up her grandchild.  On EKG she was noted to be in A. fib with RVR.  She appeared euvolemic on exam.  She went to have her DCCV on August 31, but was found to be in normal sinus rhythm.  She had a 2D echo showing her EF had improved since 2017 with EF from 35-40% to EF 45-50%.  Normal right heart function and no significant valve disease.  Her electrolytes were normal, no anemia, kidney function improved.  Today, she says she feels well.  She has no sense of palpitations.  No shortness of breath or lightheadedness.  She continues to have this chest pressure when she bends down to pick up her grandchildren.  She describes it as a pressure that hits her in the central chest.  It improves with rest.  She does not feel this chest pain with other exertion such as a flight of stairs.  I do not see any ischemic evaluation since her stenting in 2017.     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   HYPERLIPIDEMIA 03/27/2006   HYPERTENSION 03/27/2006   Intertrochanteric fracture of right hip ([place]) 07/13/2012   LUNG NODULE 09/01/2006   Excision, Bx [place]   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"   Osteopenia 2004   Dexa 2004 showed Osteopenia, DEXA 03/2007 normal   Osteopenia    Other chronic cystitis with hematuria    Pelvic fracture ([place]) 06/16/2016   S/P fall   Recurrent urinary tract infection    Seeing Urology   RETINOPATHY, 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   Seizures ([place])    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[place]) [id number]    Past Surgical History:  Procedure Laterality Date   ANKLE FRACTURE SURGERY Bilateral 08/2015   "right one was in 2 places"   CARDIAC CATHETERIZATION N/A 05/17/2015   Procedure: Left Heart Cath and Coronary Angiography;  Surgeon: Jettie Booze, MD;  Location: [facility];  Service: Cardiovascular;  Laterality: N/A;   CARDIAC CATHETERIZATION N/A 05/17/2015   Procedure: Coronary Balloon Angioplasty;  Surgeon: Jettie Booze, MD;  Location: [facility];  Service: Cardiovascular;  Laterality: N/A;   FEMUR IM NAIL Right 07/15/2012   Procedure: INTRAMEDULLARY (IM) NAIL HIP;  Surgeon: Vonna Kotyk  Llana Aliment, MD;  Location: [facility];  Service: Orthopedics;  Laterality: Right;   FEMUR IM NAIL Left 12/02/2015   Procedure: INTRAMEDULLARY (IM) NAIL FEMORAL;  Surgeon: [name], MD;  Location: [facility];  Service: Orthopedics;  Laterality: Left;   FRACTURE SURGERY     INCISION AND DRAINAGE HIP Left 01/03/2016   Procedure: IRRIGATION AND DEBRIDEMENT HIP;  Surgeon: [name], MD;  Location: [facility];  Service: Orthopedics;  Laterality: Left;   SHOULDER SURGERY Bilateral    "don't remember which side"    Current Medications: Current Meds  Medication Sig   alendronate (FOSAMAX) 35 MG tablet Take 35 mg  by mouth every Monday. Take with a full glass of water on an empty stomach.   ezetimibe (ZETIA) 10 MG tablet Take 1 tablet (10 mg total) by mouth daily.   furosemide (LASIX) 40 MG tablet TAKE 1 TABLET BY MOUTH TWICE DAILY AS NEEDED FOR FLUID (Patient taking differently: Take 40 mg by mouth daily.)   insulin NPH-regular Human (NOVOLIN 70/30 RELION) (70-30) 100 UNIT/ML injection 25 units with breakfast and 3 units with the evening meal. (Patient taking differently: Inject 20 Units into the skin daily with breakfast.)   metoprolol succinate (TOPROL XL) 50 MG 24 hr tablet Take 1 tablet (50 mg total) by mouth daily.   Multiple Vitamins-Minerals (MULTIVITAMIN ADULT PO) Take 1 tablet by mouth daily with breakfast.    warfarin (COUMADIN) 2.5 MG tablet TAKE 1 TO 2 TABLETS BY MOUTH ONCE DAILY OR  AS  DIRECTED  BY  COUMADIN  CLINIC (Patient taking differently: Take 1.25-2.5 mg by mouth See admin instructions. Take 2.5 mg daily except on Mondays take 1.25 mg)     Allergies:   Procaine hcl and Amoxicillin   Social History   Socioeconomic History   Marital status: Married    Spouse name: [name]   Number of children: 1   Years of education: College   Highest education level: Not on file  Occupational History   Occupation: retired     Fish farm manager: RETIRED  Tobacco Use   Smoking status: Former    Types: Cigarettes    Quit date: 03/02/1978    Years since quitting: 42.7   Smokeless tobacco: Never  Vaping Use   Vaping Use: Never used  Substance and Sexual Activity   Alcohol use: No    Alcohol/week: 0.0 standard drinks   Drug use: No   Sexual activity: Not on file  Other Topics Concern   Not on file  Social History Narrative   Patient lives at home spouse. Uses a walker consistently.   Moved from [place] 2004   1 son, problems w/ drugs, passed away 02-25-16-- OD       Social Determinants of Health   Financial Resource Strain: Low Risk    Difficulty of Paying Living Expenses: Not hard at all  Food  Insecurity: No Food Insecurity   Worried About Charity fundraiser in the Last Year: Never true   [place] in the Last Year: Never true  Transportation Needs: No Transportation Needs   Lack of Transportation (Medical): No   Lack of Transportation (Non-Medical): No  Physical Activity: Inactive   Days of Exercise per Week: 0 days   Minutes of Exercise per Session: 0 min  Stress: No Stress Concern Present   Feeling of Stress : Not at all  Social Connections: Moderately Integrated   Frequency of Communication with Friends and Family: More than three times  a week   Frequency of Social Gatherings with Friends and Family: More than three times a week   Attends Religious Services: More than 4 times per year   Active Member of [employer] or Organizations: No   Attends Archivist Meetings: Never   Marital Status: Married     Family History: The patient's family history includes Diabetes in her father and sister; Drug abuse in her son; Heart attack (age of onset: 34) in her father. There is no history of Cancer.  ROS:   Please see the history of present illness.     EKGs/Labs/Other Studies Reviewed:    EKG:  The ekg ordered today demonstrates sinus bradycardia with first-degree AV block, anterior infarct, T wave inversion inferiorly and in V5 and V6.  Recent Labs: 05/02/2020: ALT 11 08/07/2020: TSH 1.20 10/23/2020: BUN 29; Creatinine, Ser 1.57; Hemoglobin 13.1; Platelets 356; Potassium 4.2; Sodium 142  Recent Lipid Panel    Component Value Date/Time   CHOL 216 (H) 08/07/2020 1111   TRIG 139.0 08/07/2020 1111   HDL 49.80 08/07/2020 1111   CHOLHDL 4 08/07/2020 1111   VLDL 27.8 08/07/2020 1111   LDLCALC 138 (H) 08/07/2020 1111    Physical Exam:    VS:  BP 125/60   Pulse (!) 56   Ht '5\' 9"'$  (1.753 m)   Wt 167 lb 9.6 oz (76 kg)   SpO2 99%   BMI 24.75 kg/m     Wt Readings from Last 3 Encounters:  11/14/20 167 lb 9.6 oz (76 kg)  10/02/20 161 lb 3.2 oz (73.1 kg)  09/11/20  162 lb 8 oz (73.7 kg)     GEN:  Well nourished, well developed in no acute distress HEENT: Normal NECK: No JVD; No carotid bruits CARDIAC: RRR, no murmurs, rubs, gallops RESPIRATORY:  Clear to auscultation without rales, wheezing or rhonchi  ABDOMEN: Soft, non-tender, non-distended MUSCULOSKELETAL: No edema; No deformity  SKIN: Warm and dry NEUROLOGIC:  Alert and oriented PSYCHIATRIC:  Normal affect   ASSESSMENT AND PLAN   Precordial pain  -Chest pain while bending over and picking up her grandchildren. -Started Toprol in August 2022 which could help with angina.   -Ordered Myoview stress test to rule out ischemia.  If her stress test is normal, she does have history of a small hiatal hernia, kidney stones and gallstones.    Paroxysmal Atrial Fibrillation/ Atrial Flutter s/p Ablation / History of SVT  - Continue Toprol 50 mg daily for rate control. - Was in NSR when she arrived for cardioversion on August 31.  EKG today shows sinus bradycardia with heart rate 55. - Continue chronic anticoagulation with Coumadin.  She states she has been on Coumadin for 25 years and therefore has not considered changing to a DOAC. -She is seeing EP next week to determine if further A. fib management is warranted (such as consideration of repeat A. fib ablation).   CAD - History of NSTEMI in 05/2015 treated with POBA to OM1. - As chest pain continues despite being in normal sinus rhythm, will rule out ischemia with stress testing. - No aspirin given anticoagulation.  Continue metoprolol. - Optimize lipids.   Ischemic Cardiomyopathy, euvolemic - Echo 10/2020 with EF 45-50% - Continue Lasix '40mg'$  PRN.     Hyperlipidemia, poorly controlled -LDL 138 in June 2022. -Continue Lipitor 80 mg daily.  Added Zetia 10 mg daily in 09/2020. -Recheck lipids in 6 weeks.  Disposition - Follow-up in 6 weeks to readdress lipid management and review  stress testing results.  After this appointment, I will set up  long-term care with one of our [facility] physicians as Dr. Oval Linsey has changed offices.   Shared Decision Making/Informed Consent The risks [chest pain, shortness of breath, cardiac arrhythmias, dizziness, blood pressure fluctuations, myocardial infarction, stroke/transient ischemic attack, nausea, vomiting, allergic reaction, radiation exposure, metallic taste sensation and life-threatening complications (estimated to be 1 in 10,000)], benefits (risk stratification, diagnosing coronary artery disease, treatment guidance) and alternatives of a nuclear stress test were discussed in detail with Ms. Floyce Stakes and she agrees to proceed.    Medication Adjustments/Labs and Tests Ordered: Current medicines are reviewed at length with the patient today.  Concerns regarding medicines are outlined above.  Orders Placed This Encounter  Procedures   Lipid panel   MYOCARDIAL PERFUSION IMAGING   EKG 12-Lead   No orders of the defined types were placed in this encounter.   Patient Instructions  Medication Instructions:  No Changes *If you need a refill on your cardiac medications before your next appointment, please call your pharmacy*   Lab Work: Lipid Panel: To Be Done in Six Weeks If you have labs (blood work) drawn today and your tests are completely normal, you will receive your results only by: [place] (if you have MyChart) OR A paper copy in the mail If you have any lab test that is abnormal or we need to change your treatment, we will call you to review the results.   Testing/Procedures: [address] [place] has requested that you have a Lexicographer. For further information please visit [email or web address]. Please follow instruction sheet, as given.    Follow-Up: At [facility], you and your health needs are our priority.  As part of our continuing mission to provide you with exceptional heart care, we have created designated Provider Care  Teams.  These Care Teams include your primary Cardiologist (physician) and Advanced Practice Providers (APPs -  Physician Assistants and Nurse Practitioners) who all work together to provide you with the care you need, when you need it.  Your next appointment:   6 week(s)  The format for your next appointment:   In Person  Provider:   Caron Presume, PA-C       Signed, Warren Lacy, PA-C  11/14/2020 11:19 AM    [place]

## 2020-11-14 ENCOUNTER — Other Ambulatory Visit: Payer: Self-pay

## 2020-11-14 ENCOUNTER — Ambulatory Visit (INDEPENDENT_AMBULATORY_CARE_PROVIDER_SITE_OTHER): Payer: Medicare Other | Admitting: Physician Assistant

## 2020-11-14 ENCOUNTER — Encounter: Payer: Self-pay | Admitting: Physician Assistant

## 2020-11-14 VITALS — BP 125/60 | HR 56 | Ht 69.0 in | Wt 167.6 lb

## 2020-11-14 DIAGNOSIS — R079 Chest pain, unspecified: Secondary | ICD-10-CM

## 2020-11-14 DIAGNOSIS — E782 Mixed hyperlipidemia: Secondary | ICD-10-CM

## 2020-11-14 DIAGNOSIS — I255 Ischemic cardiomyopathy: Secondary | ICD-10-CM | POA: Diagnosis not present

## 2020-11-14 DIAGNOSIS — R072 Precordial pain: Secondary | ICD-10-CM

## 2020-11-14 DIAGNOSIS — I480 Paroxysmal atrial fibrillation: Secondary | ICD-10-CM

## 2020-11-14 NOTE — Patient Instructions (Addendum)
Medication Instructions:  No Changes *If you need a refill on your cardiac medications before your next appointment, please call your pharmacy*   Lab Work: Lipid Panel: To Be Done in Six Weeks If you have labs (blood work) drawn today and your tests are completely normal, you will receive your results only by: [place] (if you have MyChart) OR A paper copy in the mail If you have any lab test that is abnormal or we need to change your treatment, we will call you to review the results.   Testing/Procedures: [address] [place] has requested that you have a Lexicographer. For further information please visit [email or web address]. Please follow instruction sheet, as given.    Follow-Up: At [facility], you and your health needs are our priority.  As part of our continuing mission to provide you with exceptional heart care, we have created designated Provider Care Teams.  These Care Teams include your primary Cardiologist (physician) and Advanced Practice Providers (APPs -  Physician Assistants and Nurse Practitioners) who all work together to provide you with the care you need, when you need it.  Your next appointment:   6 week(s)  The format for your next appointment:   In Person  Provider:   Caron Presume, PA-C

## 2020-11-15 ENCOUNTER — Telehealth (HOSPITAL_COMMUNITY): Payer: Self-pay | Admitting: *Deleted

## 2020-11-15 NOTE — Telephone Encounter (Signed)
Close encounter 

## 2020-11-15 NOTE — Addendum Note (Signed)
Addended by: Caron Presume on: 11/15/2020 09:31 AM   Modules accepted: Orders

## 2020-11-19 ENCOUNTER — Telehealth: Payer: Self-pay | Admitting: Cardiovascular Disease

## 2020-11-19 ENCOUNTER — Ambulatory Visit (HOSPITAL_COMMUNITY)
Admission: RE | Admit: 2020-11-19 | Discharge: 2020-11-19 | Disposition: A | Discharge status: Home / Self Care | Payer: Medicare Other | Source: Ambulatory Visit | Attending: Cardiology | Admitting: Cardiology

## 2020-11-19 ENCOUNTER — Other Ambulatory Visit: Payer: Self-pay

## 2020-11-19 DIAGNOSIS — R079 Chest pain, unspecified: Secondary | ICD-10-CM | POA: Diagnosis present

## 2020-11-19 MED ORDER — AMINOPHYLLINE 25 MG/ML IV SOLN
75.0000 mg | Freq: Once | INTRAVENOUS | Status: AC
  Administered 2020-11-19: 75 mg via INTRAVENOUS

## 2020-11-19 MED ORDER — TECHNETIUM TC 99M TETROFOSMIN IV KIT
9.9000 | PACK | Freq: Once | INTRAVENOUS | Status: AC | PRN
  Administered 2020-11-19: 9.9 via INTRAVENOUS
  Filled 2020-11-19: qty 10

## 2020-11-19 MED ORDER — TECHNETIUM TC 99M TETROFOSMIN IV KIT
30.9000 | PACK | Freq: Once | INTRAVENOUS | Status: AC | PRN
  Administered 2020-11-19: 30.9 via INTRAVENOUS
  Filled 2020-11-19: qty 31

## 2020-11-19 MED ORDER — REGADENOSON 0.4 MG/5ML IV SOLN
0.4000 mg | Freq: Once | INTRAVENOUS | Status: AC
  Administered 2020-11-19: 0.4 mg via INTRAVENOUS

## 2020-11-19 NOTE — Telephone Encounter (Signed)
Routed to operator as no message notes

## 2020-11-20 LAB — MYOCARDIAL PERFUSION IMAGING
LV dias vol: 89 mL (ref 46–106)
LV sys vol: 36 mL
Nuc Stress EF: 59 %
Peak HR: 62 {beats}/min
Rest HR: 52 {beats}/min
Rest Nuclear Isotope Dose: 9.9 mCi
SDS: 7
SRS: 14
SSS: 21
ST Depression (mm): 0 mm
Stress Nuclear Isotope Dose: 30.9 mCi
TID: 0.96

## 2020-11-21 ENCOUNTER — Ambulatory Visit (INDEPENDENT_AMBULATORY_CARE_PROVIDER_SITE_OTHER): Payer: Medicare Other | Admitting: Cardiology

## 2020-11-21 ENCOUNTER — Ambulatory Visit (INDEPENDENT_AMBULATORY_CARE_PROVIDER_SITE_OTHER): Payer: Medicare Other

## 2020-11-21 ENCOUNTER — Other Ambulatory Visit: Payer: Self-pay

## 2020-11-21 ENCOUNTER — Encounter: Payer: Self-pay | Admitting: Cardiology

## 2020-11-21 VITALS — BP 132/70 | HR 62 | Ht 69.0 in | Wt 168.0 lb

## 2020-11-21 DIAGNOSIS — N1832 Chronic kidney disease, stage 3b: Secondary | ICD-10-CM

## 2020-11-21 DIAGNOSIS — I482 Chronic atrial fibrillation, unspecified: Secondary | ICD-10-CM | POA: Diagnosis not present

## 2020-11-21 DIAGNOSIS — E118 Type 2 diabetes mellitus with unspecified complications: Secondary | ICD-10-CM

## 2020-11-21 DIAGNOSIS — Z5181 Encounter for therapeutic drug level monitoring: Secondary | ICD-10-CM

## 2020-11-21 DIAGNOSIS — I480 Paroxysmal atrial fibrillation: Secondary | ICD-10-CM | POA: Diagnosis not present

## 2020-11-21 DIAGNOSIS — I10 Essential (primary) hypertension: Secondary | ICD-10-CM | POA: Diagnosis not present

## 2020-11-21 DIAGNOSIS — Z794 Long term (current) use of insulin: Secondary | ICD-10-CM

## 2020-11-21 LAB — POCT INR: INR: 1.1 — AB (ref 2.0–3.0)

## 2020-11-21 NOTE — Patient Instructions (Addendum)
Medication Instructions:  Your physician recommends that you continue on your current medications as directed. Please refer to the Current Medication list given to you today. *If you need a refill on your cardiac medications before your next appointment, please call your pharmacy*  Lab Work: None ordered. If you have labs (blood work) drawn today and your tests are completely normal, you will receive your results only by: [place] (if you have MyChart) OR A paper copy in the mail If you have any lab test that is abnormal or we need to change your treatment, we will call you to review the results.  Testing/Procedures: Your physician has recommended that you wear a holter monitor. Holter monitors are medical devices that record the heart's electrical activity. Doctors most often use these monitors to diagnose arrhythmias. Arrhythmias are problems with the speed or rhythm of the heartbeat. The monitor is a small, portable device. You can wear one while you do your normal daily activities. This is usually used to diagnose what is causing palpitations/syncope (passing out).  You will wear a 2 week ZIO monitor  Follow-Up: At [facility], you and your health needs are our priority.  As part of our continuing mission to provide you with exceptional heart care, we have created designated Provider Care Teams.  These Care Teams include your primary Cardiologist (physician) and Advanced Practice Providers (APPs -  Physician Assistants and Nurse Practitioners) who all work together to provide you with the care you need, when you need it.  Your next appointment:   Your physician wants you to follow-up based on results of heart monitor  Your physician has recommended that you wear a Zio monitor.   This monitor is a medical device that records the heart's electrical activity. Doctors most often use these monitors to diagnose arrhythmias. Arrhythmias are problems with the speed or rhythm of the  heartbeat. The monitor is a small device applied to your chest. You can wear one while you do your normal daily activities. While wearing this monitor if you have any symptoms to push the button and record what you felt. Once you have worn this monitor for the period of time provider prescribed (Usually 14 days), you will return the monitor device in the postage paid box. Once it is returned they will download the data collected and provide [place] with a report which the provider will then review and we will call you with those results. Important tips:  Avoid showering during the first 24 hours of wearing the monitor. Avoid excessive sweating to help maximize wear time. Do not submerge the device, no hot tubs, and no swimming pools. Keep any lotions or oils away from the patch. After 24 hours you may shower with the patch on. Take brief showers with your back facing the shower head.  Do not remove patch once it has been placed because that will interrupt data and decrease adhesive wear time. Push the button when you have any symptoms and write down what you were feeling. Once you have completed wearing your monitor, remove and place into box which has postage paid and place in your outgoing mailbox.  If for some reason you have misplaced your box then call our office and we can provide another box and/or mail it off for you.

## 2020-11-21 NOTE — Progress Notes (Unsigned)
Enrolled patient for a 14 day Zio XT  monitor to be mailed to patients home  °

## 2020-11-21 NOTE — Patient Instructions (Signed)
Description   Take 2 tablets today and 1.5 tablets tomorrow and then continue taking Warfarin 1 tablet daily except for 1/2 tablet on Mondays and Fridays. INR in 1 week. Coumadin Clinic [phone]

## 2020-11-21 NOTE — Progress Notes (Signed)
Electrophysiology Office Note:    Date:  11/21/2020   ID:  Marlisa, Cavagnaro [birth year], MRN [id number]  PCP:  Colon Branch, MD  [facility] Cardiologist:  Skeet Latch, MD  [facility] Electrophysiologist:  None   Referring MD: Warren Lacy, PA*   Chief Complaint: Atrial fibrillation and flutter  History of Present Illness:    Fred Mccanless Wojno is a 80 y.o. female who presents for an evaluation of atrial fibrillation at the request of Caron Presume, PA-C. Their medical history includes hypertension, hyperlipidemia, GERD, CKD 3, memory deficit.  She is followed by Dr. Oval Linsey as well.  She tells me she continues to have episodes of chest discomfort when she bends over.  It is centrally located.  The symptoms of chest pain or not correlated to palpitations.  She cannot really tell if she has had any atrial fibrillation episodes.  She is unclear about the details of her prior ablation.  Previously during an episode of chest pain she was noted to be in atrial fibrillation with rapid ventricular rates.  This raise the question whether or not the atrial fibrillation was contributing to her feelings of chest discomfort.  Today she tells me that she continues to have episodes and is in normal rhythm today.  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   HYPERLIPIDEMIA 03/27/2006   HYPERTENSION 03/27/2006   Intertrochanteric fracture of right hip ([place]) 07/13/2012   LUNG NODULE 09/01/2006   Excision, Bx Benighn   Memory  deficit ~ 2013   "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"   Osteopenia 2004   Dexa 2004 showed Osteopenia, DEXA 03/2007 normal   Osteopenia    Other chronic cystitis with hematuria    Pelvic fracture ([place]) 06/16/2016   S/P fall   Recurrent urinary tract infection    Seeing Urology   RETINOPATHY, 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/27/2006   Seizures ([place])    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[place]) Q000111Q    Past Surgical History:  Procedure Laterality Date   ANKLE FRACTURE SURGERY Bilateral 08/2015   "right one was in 2 places"   CARDIAC CATHETERIZATION N/A 05/17/2015   Procedure: Left Heart Cath and Coronary Angiography;  Surgeon: Jettie Booze, MD;  Location: [place] [facility];  Service: Cardiovascular;  Laterality: N/A;   CARDIAC CATHETERIZATION N/A 05/17/2015   Procedure: Coronary Balloon Angioplasty;  Surgeon: Jettie Booze, MD;  Location: [place] [facility];  Service: Cardiovascular;  Laterality: N/A;   FEMUR IM NAIL Right 07/15/2012   Procedure: INTRAMEDULLARY (IM) NAIL HIP;  Surgeon: Johnny Bridge, MD;  Location: [place];  Service: Orthopedics;  Laterality: Right;   FEMUR IM NAIL Left 12/02/2015   Procedure: INTRAMEDULLARY (IM) NAIL FEMORAL;  Surgeon: [name], MD;  Location: [place];  Service: Orthopedics;  Laterality: Left;   FRACTURE SURGERY     INCISION AND DRAINAGE HIP Left 01/03/2016   Procedure: IRRIGATION AND DEBRIDEMENT HIP;  Surgeon: [name], MD;  Location: [place];  Service:  Orthopedics;  Laterality: Left;   SHOULDER SURGERY Bilateral    "don't remember which side"    Current Medications: Current Meds  Medication Sig   alendronate (FOSAMAX) 35 MG tablet Take 35 mg by mouth every Monday. Take with a full glass of water on an empty stomach.   atorvastatin (LIPITOR) 80 MG tablet Take 1 tablet by mouth once daily   ezetimibe (ZETIA) 10 MG tablet Take 1 tablet (10 mg total) by mouth daily.   furosemide (LASIX) 40 MG tablet TAKE 1 TABLET BY MOUTH TWICE DAILY AS NEEDED FOR FLUID   insulin NPH-regular Human  (NOVOLIN 70/30 RELION) (70-30) 100 UNIT/ML injection 25 units with breakfast and 3 units with the evening meal.   metoprolol succinate (TOPROL XL) 50 MG 24 hr tablet Take 1 tablet (50 mg total) by mouth daily.   Multiple Vitamins-Minerals (MULTIVITAMIN ADULT PO) Take 1 tablet by mouth daily with breakfast.    warfarin (COUMADIN) 2.5 MG tablet TAKE 1 TO 2 TABLETS BY MOUTH ONCE DAILY OR  AS  DIRECTED  BY  COUMADIN  CLINIC     Allergies:   Procaine hcl and Amoxicillin   Social History   Socioeconomic History   Marital status: Married    Spouse name: [name]   Number of children: 1   Years of education: College   Highest education level: Not on file  Occupational History   Occupation: retired     Fish farm manager: RETIRED  Tobacco Use   Smoking status: Former    Types: Cigarettes    Quit date: 03/02/1978    Years since quitting: 42.7   Smokeless tobacco: Never  Vaping Use   Vaping Use: Never used  Substance and Sexual Activity   Alcohol use: No    Alcohol/week: 0.0 standard drinks   Drug use: No   Sexual activity: Not on file  Other Topics Concern   Not on file  Social History Narrative   Patient lives at home spouse. Uses a walker consistently.   Moved from [place] 2004   1 son, problems w/ drugs, passed away 02/21/16-- OD       Social Determinants of Health   Financial Resource Strain: Low Risk    Difficulty of Paying Living Expenses: Not hard at all  Food Insecurity: No Food Insecurity   Worried About Charity fundraiser in the Last Year: Never true   [place] in the Last Year: Never true  Transportation Needs: No Transportation Needs   Lack of Transportation (Medical): No   Lack of Transportation (Non-Medical): No  Physical Activity: Inactive   Days of Exercise per Week: 0 days   Minutes of Exercise per Session: 0 min  Stress: No Stress Concern Present   Feeling of Stress : Not at all  Social Connections: Moderately Integrated   Frequency of Communication with Friends and  Family: More than three times a week   Frequency of Social Gatherings with Friends and Family: More than three times a week   Attends Religious Services: More than 4 times per year   Active Member of [employer] or Organizations: No   Attends Archivist Meetings: Never   Marital Status: Married     Family History: The patient's family history includes Diabetes in her father and sister; Drug abuse in her son; Heart attack (age of onset: 35) in her father. There is no history of Cancer.  ROS:   Please see the history of present illness.    All other  systems reviewed and are negative.  EKGs/Labs/Other Studies Reviewed:    The following studies were reviewed today:  November 19, 2020 SPECT personally reviewed No ischemia Low risk study Normal left ventricular function Fixed tracer defect in the apical to mid inferolateral wall consistent with prior infarction.    Recent Labs: 05/02/2020: ALT 11 08/07/2020: TSH 1.20 10/23/2020: BUN 29; Creatinine, Ser 1.57; Hemoglobin 13.1; Platelets 356; Potassium 4.2; Sodium 142  Recent Lipid Panel    Component Value Date/Time   CHOL 216 (H) 08/07/2020 1111   TRIG 139.0 08/07/2020 1111   HDL 49.80 08/07/2020 1111   CHOLHDL 4 08/07/2020 1111   VLDL 27.8 08/07/2020 1111   LDLCALC 138 (H) 08/07/2020 1111    Physical Exam:    VS:  BP 132/70   Pulse 62   Ht '5\' 9"'$  (1.753 m)   Wt 168 lb (76.2 kg)   SpO2 98%   BMI 24.81 kg/m     Wt Readings from Last 3 Encounters:  11/21/20 168 lb (76.2 kg)  11/19/20 167 lb (75.8 kg)  11/14/20 167 lb 9.6 oz (76 kg)     GEN:  Well nourished, well developed in no acute distress HEENT: Normal NECK: No JVD; No carotid bruits LYMPHATICS: No lymphadenopathy CARDIAC: RRR, no murmurs, rubs, gallops RESPIRATORY:  Clear to auscultation without rales, wheezing or rhonchi  ABDOMEN: Soft, non-tender, non-distended MUSCULOSKELETAL:  No edema; No deformity  SKIN: Warm and dry NEUROLOGIC:  Alert and oriented x  3 PSYCHIATRIC:  Normal affect   ASSESSMENT:    1. Paroxysmal atrial fibrillation (HCC)   2. Stage 3b chronic kidney disease ([place])   3. Type 2 diabetes mellitus with complication, with long-term current use of insulin ([place])   4. Essential hypertension    PLAN:    In order of problems listed above:  #Paroxysmal atrial fibrillation Unclear burden.  Is unclear to me whether or not the symptoms that she experiences correspond to episodes of atrial fibrillation.  I would like to do a 2-week ZIO monitor to better assess her burden of A. fib.  If there is a high burden or if her episodes of atrial fibrillation correspond to symptoms, would advocate for a rhythm control strategy.  I think she would overall be an acceptable candidate for ablation or antiarrhythmic drug therapy.  We will base follow-up on the results of the Holter monitor  #Hypertension Controlled.  Continue current regimen.  Follow-up based on results of Holter.   Medication Adjustments/Labs and Tests Ordered: Current medicines are reviewed at length with the patient today.  Concerns regarding medicines are outlined above.  Orders Placed This Encounter  Procedures   LONG TERM MONITOR (3-14 DAYS)   No orders of the defined types were placed in this encounter.    Signed, Hilton Cork. Quentin Ore, MD, [facility], [facility] 11/21/2020 11:20 AM    Electrophysiology [facility]

## 2020-11-22 ENCOUNTER — Telehealth: Payer: Self-pay

## 2020-11-22 NOTE — Telephone Encounter (Addendum)
Spoke with patient regarding stress test results.----- Message from Warren Lacy, PA-C sent at 11/20/2020  2:48 PM EDT ----- Results of your stress test look good.  It showed your known coronary disease.  There was no evidence of active ischemia or signs of new blockage.  Overall, this is a low risk study.

## 2020-11-25 DIAGNOSIS — I480 Paroxysmal atrial fibrillation: Secondary | ICD-10-CM

## 2020-11-29 ENCOUNTER — Other Ambulatory Visit: Payer: Self-pay

## 2020-11-29 ENCOUNTER — Ambulatory Visit (INDEPENDENT_AMBULATORY_CARE_PROVIDER_SITE_OTHER): Payer: Medicare Other | Admitting: *Deleted

## 2020-11-29 DIAGNOSIS — Z5181 Encounter for therapeutic drug level monitoring: Secondary | ICD-10-CM

## 2020-11-29 DIAGNOSIS — I482 Chronic atrial fibrillation, unspecified: Secondary | ICD-10-CM | POA: Diagnosis not present

## 2020-11-29 DIAGNOSIS — I480 Paroxysmal atrial fibrillation: Secondary | ICD-10-CM

## 2020-11-29 LAB — POCT INR: INR: 1.4 — AB (ref 2.0–3.0)

## 2020-11-29 NOTE — Patient Instructions (Signed)
Description    Take 2 tablets of warfarin today and then START taking warfarin 1 tablet daily except for 1/2 a tablet on Fridays. Recheck INR in 1 week. Coumadin Clinic [phone].

## 2020-12-02 ENCOUNTER — Other Ambulatory Visit: Payer: Self-pay | Admitting: Physician Assistant

## 2020-12-02 DIAGNOSIS — I480 Paroxysmal atrial fibrillation: Secondary | ICD-10-CM

## 2020-12-02 NOTE — Telephone Encounter (Addendum)
Prescription refill request received for warfarin Lov: lambert, 11/21/2020  Next INR check: 10/7 Warfarin tablet strength: 2.5 mg

## 2020-12-06 ENCOUNTER — Ambulatory Visit (INDEPENDENT_AMBULATORY_CARE_PROVIDER_SITE_OTHER): Payer: Medicare Other

## 2020-12-06 ENCOUNTER — Other Ambulatory Visit: Payer: Self-pay

## 2020-12-06 DIAGNOSIS — Z5181 Encounter for therapeutic drug level monitoring: Secondary | ICD-10-CM

## 2020-12-06 DIAGNOSIS — I480 Paroxysmal atrial fibrillation: Secondary | ICD-10-CM

## 2020-12-06 DIAGNOSIS — I482 Chronic atrial fibrillation, unspecified: Secondary | ICD-10-CM | POA: Diagnosis not present

## 2020-12-06 LAB — POCT INR: INR: 1.6 — AB (ref 2.0–3.0)

## 2020-12-06 NOTE — Patient Instructions (Signed)
-   Take 1.5 tablets of warfarin today and then  - START taking warfarin 1 tablet daily - Recheck INR in 10 days.  Coumadin Clinic [phone].

## 2020-12-12 ENCOUNTER — Other Ambulatory Visit: Payer: Self-pay

## 2020-12-12 ENCOUNTER — Ambulatory Visit (INDEPENDENT_AMBULATORY_CARE_PROVIDER_SITE_OTHER): Payer: Medicare Other | Admitting: Endocrinology

## 2020-12-12 VITALS — BP 120/60 | HR 60 | Ht 69.0 in | Wt 169.2 lb

## 2020-12-12 DIAGNOSIS — E1122 Type 2 diabetes mellitus with diabetic chronic kidney disease: Secondary | ICD-10-CM | POA: Diagnosis not present

## 2020-12-12 DIAGNOSIS — Z794 Long term (current) use of insulin: Secondary | ICD-10-CM

## 2020-12-12 DIAGNOSIS — I255 Ischemic cardiomyopathy: Secondary | ICD-10-CM

## 2020-12-12 DIAGNOSIS — E1159 Type 2 diabetes mellitus with other circulatory complications: Secondary | ICD-10-CM

## 2020-12-12 DIAGNOSIS — N1832 Chronic kidney disease, stage 3b: Secondary | ICD-10-CM

## 2020-12-12 LAB — POCT GLYCOSYLATED HEMOGLOBIN (HGB A1C): Hemoglobin A1C: 8.9 % — AB (ref 4.0–5.6)

## 2020-12-12 MED ORDER — NOVOLIN 70/30 RELION (70-30) 100 UNIT/ML ~~LOC~~ SUSP: SUBCUTANEOUS | 3 refills | Status: DC

## 2020-12-12 NOTE — Patient Instructions (Addendum)
Please increase the insulin to 23 units with breakfast and 4 units with the evening meal  On this type of insulin schedule, you should eat meals on a regular schedule.  If a meal is missed or significantly delayed, your blood sugar could go low.    check your blood sugar twice a day.  vary the time of day when you check, between before the 3 meals, and at bedtime.  also check if you have symptoms of your blood sugar being too high or too low.  please keep a record of the readings and bring it to your next appointment here.  please call us sooner if your blood sugar goes below 70, or if you have a lot of readings over 200. Please come back for a follow-up appointment in 3 months.

## 2020-12-12 NOTE — Progress Notes (Signed)
Subjective:    Patient ID: [name] [name], female    DOB: Jul 09, 1940, 80 y.o.   MRN: [id number]  HPI Pt returns for f/u of diabetes mellitus:   DM type: Insulin-requiring type 2.  Dx'ed: 0000000 Complications: PN, CAD, stage 3b CRI, and DR.  Therapy: insulin since soon after dx.  GDM: never.  DKA: never.  Severe hypoglycemia: last episode was in mid-2018.  Pancreatitis: never.  SDOH: She takes human insulin, due to cost.  Other: she is on BID premixed insulin, due to noncompliance with multiple daily injections.    Interval history: no cbg record, but states cbg's vary from 88-201.  It is in general higher as the day goes on.  pt states she feels well in general.  She takes 21 units with breakfast and 4 units with the evening meal. Pt says she never misses the insulin.   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   HYPERLIPIDEMIA 03/27/2006   HYPERTENSION 03/27/2006   Intertrochanteric fracture of right hip ([place]) 07/13/2012   LUNG NODULE 09/01/2006   Excision, Bx Benighn   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"   Osteopenia 2004   Dexa 2004 showed Osteopenia, DEXA 03/2007 normal   Osteopenia    Other chronic cystitis with hematuria    Pelvic fracture ([place]) 06/16/2016   S/P fall   Recurrent urinary tract infection    Seeing Urology   RETINOPATHY, 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/2006   Seizures ([place])    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[place]) Q000111Q    Past Surgical History:  Procedure Laterality Date   ANKLE FRACTURE SURGERY Bilateral 08/2015    "right one was in 2 places"   CARDIAC CATHETERIZATION N/A 05/17/2015   Procedure: Left Heart Cath and Coronary Angiography;  Surgeon: Jettie Booze, MD;  Location: [place] [facility];  Service: Cardiovascular;  Laterality: N/A;   CARDIAC CATHETERIZATION N/A 05/17/2015   Procedure: Coronary Balloon Angioplasty;  Surgeon: Jettie Booze, MD;  Location: [place] [facility];  Service: Cardiovascular;  Laterality: N/A;   FEMUR IM NAIL Right 07/15/2012   Procedure: INTRAMEDULLARY (IM) NAIL HIP;  Surgeon: Johnny Bridge, MD;  Location: [place];  Service: Orthopedics;  Laterality: Right;   FEMUR IM NAIL Left 12/02/2015   Procedure: INTRAMEDULLARY (IM) NAIL FEMORAL;  Surgeon: [name], MD;  Location: [place];  Service: Orthopedics;  Laterality: Left;   FRACTURE SURGERY     INCISION AND DRAINAGE HIP Left 01/03/2016   Procedure: IRRIGATION AND DEBRIDEMENT HIP;  Surgeon: [name], MD;  Location: [place];  Service: Orthopedics;  Laterality: Left;   SHOULDER SURGERY Bilateral    "don't remember which side"    Social History   Socioeconomic History   Marital status: Married    Spouse name: [name]   Number of children: 1   Years of education: College   Highest education level: Not on file  Occupational History   Occupation: retired     Fish farm manager:  RETIRED  Tobacco Use   Smoking status: Former    Types: Cigarettes    Quit date: 03/02/1978    Years since quitting: 42.8   Smokeless tobacco: Never  Vaping Use   Vaping Use: Never used  Substance and Sexual Activity   Alcohol use: No    Alcohol/week: 0.0 standard drinks   Drug use: No   Sexual activity: Not on file  Other Topics Concern   Not on file  Social History Narrative   Patient lives at home spouse. Uses a walker consistently.   Moved from [place] 2004   1 son, problems w/ drugs, passed away 02-29-2016-- OD       Social Determinants of Health   Financial Resource Strain: Low Risk    Difficulty of Paying Living Expenses: Not hard at all   Food Insecurity: No Food Insecurity   Worried About Charity fundraiser in the Last Year: Never true   [place] in the Last Year: Never true  Transportation Needs: No Transportation Needs   Lack of Transportation (Medical): No   Lack of Transportation (Non-Medical): No  Physical Activity: Inactive   Days of Exercise per Week: 0 days   Minutes of Exercise per Session: 0 min  Stress: No Stress Concern Present   Feeling of Stress : Not at all  Social Connections: Moderately Integrated   Frequency of Communication with Friends and Family: More than three times a week   Frequency of Social Gatherings with Friends and Family: More than three times a week   Attends Religious Services: More than 4 times per year   Active Member of [employer] or Organizations: No   Attends Archivist Meetings: Never   Marital Status: Married  Human resources officer Violence: Not At Risk   Fear of Current or Ex-Partner: No   Emotionally Abused: No   Physically Abused: No   Sexually Abused: No    Current Outpatient Medications on File Prior to Visit  Medication Sig Dispense Refill   alendronate (FOSAMAX) 35 MG tablet Take 35 mg by mouth every Monday. Take with a full glass of water on an empty stomach.     atorvastatin (LIPITOR) 80 MG tablet Take 1 tablet by mouth once daily 90 tablet 0   ezetimibe (ZETIA) 10 MG tablet Take 1 tablet (10 mg total) by mouth daily. 30 tablet 5   furosemide (LASIX) 40 MG tablet TAKE 1 TABLET BY MOUTH TWICE DAILY AS NEEDED FOR FLUID 180 tablet 1   metoprolol succinate (TOPROL XL) 50 MG 24 hr tablet Take 1 tablet (50 mg total) by mouth daily. 30 tablet 5   Multiple Vitamins-Minerals (MULTIVITAMIN ADULT PO) Take 1 tablet by mouth daily with breakfast.      warfarin (COUMADIN) 2.5 MG tablet Take 1 tablet by mouth daily or as directed by the coumadin clinic 45 tablet 0   No current facility-administered medications on file prior to visit.    Allergies  Allergen Reactions    Procaine Hcl Anaphylaxis   Amoxicillin Itching     Did it involve swelling of the face/tongue/throat, SOB, or low BP? No Did it involve sudden or severe rash/hives, skin peeling, or any reaction on the inside of your mouth or nose? No Did you need to seek medical attention at a hospital or doctor's office? No When did it last happen? unk If all above answers are "NO", may proceed with cephalosporin use.     Family History  Problem Relation Age of  Onset   Diabetes Father    Heart attack Father 63   Diabetes Sister    Drug abuse Son    Cancer Neg Hx        no hx of colon or breast cancer    BP 120/60 (BP Location: Right Arm, Patient Position: Sitting, Cuff Size: Normal)   Pulse 60   Ht '5\' 9"'$  (1.753 m)   Wt 169 lb 3.2 oz (76.7 kg)   SpO2 98%   BMI 24.99 kg/m    Review of Systems She denies hypoglycemia    Objective:   Physical Exam VITAL SIGNS:  See vs page GENERAL: no distress Pulses: dorsalis pedis intact bilat.   MSK: no deformity of the feet CV: no leg edema Skin:  no ulcer on the feet.  normal color and temp on the feet. Neuro: sensation is intact to touch on the feet Ext: there is bilateral onychomycosis of the toenails.    A1c=8.9%    Assessment & Plan:  Insulin-requiring type 2 DM: uncontrolled.    Patient Instructions  Please increase the insulin to 23 units with breakfast and 4 units with the evening meal  On this type of insulin schedule, you should eat meals on a regular schedule.  If a meal is missed or significantly delayed, your blood sugar could go low.    check your blood sugar twice a day.  vary the time of day when you check, between before the 3 meals, and at bedtime.  also check if you have symptoms of your blood sugar being too high or too low.  please keep a record of the readings and bring it to your next appointment here.  please call us sooner if your blood sugar goes below 70, or if you have a lot of readings over 200. Please come back  for a follow-up appointment in 3 months.

## 2020-12-16 ENCOUNTER — Ambulatory Visit (INDEPENDENT_AMBULATORY_CARE_PROVIDER_SITE_OTHER): Payer: Medicare Other

## 2020-12-16 ENCOUNTER — Other Ambulatory Visit: Payer: Self-pay

## 2020-12-16 DIAGNOSIS — Z5181 Encounter for therapeutic drug level monitoring: Secondary | ICD-10-CM | POA: Diagnosis not present

## 2020-12-16 DIAGNOSIS — I482 Chronic atrial fibrillation, unspecified: Secondary | ICD-10-CM

## 2020-12-16 DIAGNOSIS — I480 Paroxysmal atrial fibrillation: Secondary | ICD-10-CM | POA: Diagnosis not present

## 2020-12-16 LAB — POCT INR: INR: 1.9 — AB (ref 2.0–3.0)

## 2020-12-16 NOTE — Patient Instructions (Signed)
-   Take 1.5 tablets of warfarin today and then  - continue taking warfarin 1 tablet daily - Recheck INR in 2 weeks Coumadin Clinic [phone].

## 2020-12-17 NOTE — Progress Notes (Signed)
Cardiology Office Note:    Date:  12/18/2020   ID:  Taliana, Kremin 04-16-1940, MRN [id number]  PCP:  Colon Branch, MD [place] Cardiologist: Skeet Latch, MD   Reason for visit: 6-week follow-up  History of Present Illness:    [place] is a 80 y.o. female with a hx of CAD with NSTEMI in 05/2015 s/p POBA to OM1, ischemic cardiomyopathy, paroxysmal atrial fibrillation/flutter s/p remote flutter ablation on Coumadin, SVT, hypertension, hyperlipidemia, and type 2 diabetes mellitus, GERD, CKD stage III, and seizure disorder who is followed by Dr. Oval Linsey.   She saw me on October 02, 2020 and mentioned episodes of chest discomfort particularly after bending over and picking up her grandchild.  On EKG she was noted to be in A. fib with RVR.  She appeared euvolemic on exam.   She went to have her DCCV on August 31, but was found to be in normal sinus rhythm.  She had a 2D echo showing her EF had improved since 2017 with EF from 35-40% to EF 45-50%.  Normal right heart function and no significant valve disease.  Her electrolytes were normal, no anemia, kidney function improved.   Today, she says she feels well.  She denies palpitations, shortness of breath, lightheadedness/syncope and bleeding issues  She continues to have this chest pressure when she bends down to pick up her grandchildren occasionally.  She mentions her INR has not been in range following the visit from her family and difference in her diet.  She is not interested in changing anticoagulant or considering watchman device at this time.    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   HYPERLIPIDEMIA 03/27/2006   HYPERTENSION 03/27/2006   Intertrochanteric fracture of right hip ([place]) 07/13/2012   LUNG NODULE 09/01/2006   Excision, Bx Benighn   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"   Osteopenia 2004   Dexa 2004 showed Osteopenia, DEXA 03/2007 normal   Osteopenia    Other chronic cystitis with hematuria    Pelvic fracture ([place]) 06/16/2016   S/P fall   Recurrent urinary tract infection    Seeing Urology   RETINOPATHY, BACKGROUND NOS 03/27/2006   Seizures ([place])    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[place]) [id number]    Past Surgical History:  Procedure Laterality Date   ANKLE FRACTURE SURGERY Bilateral 08/2015   "right one was in 2 places"   CARDIAC CATHETERIZATION N/A 05/17/2015   Procedure: Left Heart Cath and Coronary Angiography;  Surgeon: Jettie Booze, MD;  Location: [facility];  Service: Cardiovascular;  Laterality: N/A;   CARDIAC CATHETERIZATION N/A 05/17/2015   Procedure: Coronary Balloon Angioplasty;  Surgeon: Jettie Booze, MD;  Location: [facility];  Service: Cardiovascular;  Laterality: N/A;   FEMUR IM NAIL Right 07/15/2012   Procedure: INTRAMEDULLARY (IM) NAIL HIP;  Surgeon: Johnny Bridge, MD;  Location: [facility];  Service: Orthopedics;  Laterality: Right;   FEMUR  IM NAIL Left 12/02/2015   Procedure: INTRAMEDULLARY (IM) NAIL FEMORAL;  Surgeon: [name], MD;  Location: [facility];  Service: Orthopedics;  Laterality: Left;   FRACTURE SURGERY     INCISION AND DRAINAGE HIP Left 01/03/2016   Procedure: IRRIGATION AND DEBRIDEMENT HIP;  Surgeon: [name], MD;  Location: [facility];  Service: Orthopedics;  Laterality: Left;   SHOULDER SURGERY Bilateral    "don't remember which side"    Current Medications: Current Meds  Medication Sig   alendronate (FOSAMAX) 35 MG tablet Take 35 mg by mouth every Monday. Take with a  full glass of water on an empty stomach.   atorvastatin (LIPITOR) 80 MG tablet Take 1 tablet by mouth once daily   ezetimibe (ZETIA) 10 MG tablet Take 1 tablet (10 mg total) by mouth daily.   furosemide (LASIX) 40 MG tablet TAKE 1 TABLET BY MOUTH TWICE DAILY AS NEEDED FOR FLUID   insulin NPH-regular Human (NOVOLIN 70/30 RELION) (70-30) 100 UNIT/ML injection 23 units with breakfast and 4 units with the evening meal.   metoprolol succinate (TOPROL XL) 50 MG 24 hr tablet Take 1 tablet (50 mg total) by mouth daily.   Multiple Vitamins-Minerals (MULTIVITAMIN ADULT PO) Take 1 tablet by mouth daily with breakfast.    warfarin (COUMADIN) 2.5 MG tablet Take 1 tablet by mouth daily or as directed by the coumadin clinic     Allergies:   Procaine hcl and Amoxicillin   Social History   Socioeconomic History   Marital status: Married    Spouse name: [name]   Number of children: 1   Years of education: [employer] education level: Not on file  Occupational History   Occupation: retired     Fish farm manager: RETIRED  Tobacco Use   Smoking status: Former    Types: Cigarettes    Quit date: 03/02/1978    Years since quitting: 42.8   Smokeless tobacco: Never  Vaping Use   Vaping Use: Never used  Substance and Sexual Activity   Alcohol use: No    Alcohol/week: 0.0 standard drinks   Drug use: No   Sexual activity: Not on file  Other Topics Concern   Not on file  Social History Narrative   Patient lives at home spouse. Uses a walker consistently.   Moved from [place] 2004   1 son, problems w/ drugs, passed away 02/25/2016-- OD       Social Determinants of Health   Financial Resource Strain: Low Risk    Difficulty of Paying Living Expenses: Not hard at all  Food Insecurity: No Food Insecurity   Worried About Charity fundraiser in the Last Year: Never true   [place] in the Last Year: Never true  Transportation Needs: No Transportation Needs   Lack of Transportation (Medical): No   Lack of  Transportation (Non-Medical): No  Physical Activity: Inactive   Days of Exercise per Week: 0 days   Minutes of Exercise per Session: 0 min  Stress: No Stress Concern Present   Feeling of Stress : Not at all  Social Connections: Moderately Integrated   Frequency of Communication with Friends and Family: More than three times a week   Frequency of Social Gatherings with Friends and Family: More than three times a week   Attends Religious Services: More than 4 times per year   Active Member of [employer] or Organizations: No   Attends Archivist Meetings: Never   Marital Status: Married  Family History: The patient's family history includes Diabetes in her father and sister; Drug abuse in her son; Heart attack (age of onset: 73) in her father. There is no history of Cancer.  ROS:   Please see the history of present illness.     EKGs/Labs/Other Studies Reviewed:    Recent Labs: 05/02/2020: ALT 11 08/07/2020: TSH 1.20 10/23/2020: BUN 29; Creatinine, Ser 1.57; Hemoglobin 13.1; Platelets 356; Potassium 4.2; Sodium 142   Recent Lipid Panel Lab Results  Component Value Date/Time   CHOL 216 (H) 08/07/2020 11:11 AM   TRIG 139.0 08/07/2020 11:11 AM   HDL 49.80 08/07/2020 11:11 AM   LDLCALC 138 (H) 08/07/2020 11:11 AM    Physical Exam:    VS:  BP (!) 129/54   Pulse 62   Ht '5\' 9"'$  (1.753 m)   Wt 171 lb (77.6 kg)   SpO2 99%   BMI 25.25 kg/m    No data found.  Wt Readings from Last 3 Encounters:  12/18/20 171 lb (77.6 kg)  12/12/20 169 lb 3.2 oz (76.7 kg)  11/21/20 168 lb (76.2 kg)     GEN:  Well nourished, well developed in no acute distress HEENT: Normal NECK: No JVD; No carotid bruits CARDIAC: RRR, no murmurs, rubs, gallops RESPIRATORY:  Clear to auscultation without rales, wheezing or rhonchi  ABDOMEN: Soft, non-tender, non-distended MUSCULOSKELETAL: No edema; No deformity  SKIN: Warm and dry NEUROLOGIC:  Alert and oriented PSYCHIATRIC:  Normal affect      ASSESSMENT AND PLAN   Precordial pain, intermittent  -Occasional chest pain while bending over and picking up her grandchildren.  Stress test showed no evidence of active ischemia.  Her pain may be secondary to her known hiatal hernia since it occurs with bending over.   Paroxysmal Atrial Fibrillation/ Atrial Flutter s/p Ablation / History of SVT  - Continue Toprol 50 mg daily for rate control. - Zio patch 10/2020: No sustained arrhythmias, rare supraventricular/ventricular ectopy.  EP evaluated -no indication for additional management at this time. - Continue chronic anticoagulation with Coumadin.  She states she has been on Coumadin for 25 years and therefore has not considered changing to a DOAC.    CAD, no angina - History of NSTEMI in 05/2015 treated with POBA to [place] stress test 10/2020 negative for ischemia. -Continue beta-blocker and statin. -Optimize lipids.   Ischemic Cardiomyopathy, euvolemic - Echo 10/2020 with EF 45-50% - Continue Lasix '40mg'$  PRN.     Hyperlipidemia, poorly controlled -LDL 138 in June 2022. -Continue Lipitor 80 mg daily.  Added Zetia 10 mg daily in 09/2020. -Recheck lipids.   Disposition - Follow-up in 6 months -she wishes to stay at this office and therefore we will set up with Dr. Sallyanne Kuster.    Medication Adjustments/Labs and Tests Ordered: Current medicines are reviewed at length with the patient today.  Concerns regarding medicines are outlined above.  Orders Placed This Encounter  Procedures   Lipid panel   No orders of the defined types were placed in this encounter.   Patient Instructions  Medication Instructions:  No Changes *If you need a refill on your cardiac medications before your next appointment, please call your pharmacy*   Lab Work: Lipid Panel : To Be Done In 1 Week. If you have labs (blood work) drawn today and your tests are completely normal, you will receive your results only by: [place] (if you have  MyChart) OR A paper copy in the mail If you have any lab test  that is abnormal or we need to change your treatment, we will call you to review the results.   Testing/Procedures: No Testing   Follow-Up: At [facility], you and your health needs are our priority.  As part of our continuing mission to provide you with exceptional heart care, we have created designated Provider Care Teams.  These Care Teams include your primary Cardiologist (physician) and Advanced Practice Providers (APPs -  Physician Assistants and Nurse Practitioners) who all work together to provide you with the care you need, when you need it.  We recommend signing up for the patient portal called "MyChart".  Sign up information is provided on this After Visit Summary.  MyChart is used to connect with patients for Virtual Visits (Telemedicine).  Patients are able to view lab/test results, encounter notes, upcoming appointments, etc.  Non-urgent messages can be sent to your provider as well.   To learn more about what you can do with MyChart, go to [email or web address].    Your next appointment:   6 month(s)  The format for your next appointment:   In Person  Provider:   Sanda Klein, MD       Signed, Warren Lacy, PA-C  12/18/2020 11:33 AM    [place]

## 2020-12-18 ENCOUNTER — Encounter: Payer: Self-pay | Admitting: Physician Assistant

## 2020-12-18 ENCOUNTER — Other Ambulatory Visit: Payer: Self-pay

## 2020-12-18 ENCOUNTER — Ambulatory Visit (INDEPENDENT_AMBULATORY_CARE_PROVIDER_SITE_OTHER): Payer: Medicare Other | Admitting: Physician Assistant

## 2020-12-18 VITALS — BP 129/54 | HR 62 | Ht 69.0 in | Wt 171.0 lb

## 2020-12-18 DIAGNOSIS — I2510 Atherosclerotic heart disease of native coronary artery without angina pectoris: Secondary | ICD-10-CM | POA: Diagnosis not present

## 2020-12-18 DIAGNOSIS — I480 Paroxysmal atrial fibrillation: Secondary | ICD-10-CM

## 2020-12-18 DIAGNOSIS — Z9861 Coronary angioplasty status: Secondary | ICD-10-CM

## 2020-12-18 DIAGNOSIS — R072 Precordial pain: Secondary | ICD-10-CM

## 2020-12-18 DIAGNOSIS — E782 Mixed hyperlipidemia: Secondary | ICD-10-CM

## 2020-12-18 DIAGNOSIS — I10 Essential (primary) hypertension: Secondary | ICD-10-CM | POA: Diagnosis not present

## 2020-12-18 DIAGNOSIS — I255 Ischemic cardiomyopathy: Secondary | ICD-10-CM | POA: Diagnosis not present

## 2020-12-18 NOTE — Patient Instructions (Signed)
Medication Instructions:  No Changes *If you need a refill on your cardiac medications before your next appointment, please call your pharmacy*   Lab Work: Lipid Panel : To Be Done In 1 Week. If you have labs (blood work) drawn today and your tests are completely normal, you will receive your results only by: [place] (if you have MyChart) OR A paper copy in the mail If you have any lab test that is abnormal or we need to change your treatment, we will call you to review the results.   Testing/Procedures: No Testing   Follow-Up: At [facility], you and your health needs are our priority.  As part of our continuing mission to provide you with exceptional heart care, we have created designated Provider Care Teams.  These Care Teams include your primary Cardiologist (physician) and Advanced Practice Providers (APPs -  Physician Assistants and Nurse Practitioners) who all work together to provide you with the care you need, when you need it.  We recommend signing up for the patient portal called "MyChart".  Sign up information is provided on this After Visit Summary.  MyChart is used to connect with patients for Virtual Visits (Telemedicine).  Patients are able to view lab/test results, encounter notes, upcoming appointments, etc.  Non-urgent messages can be sent to your provider as well.   To learn more about what you can do with MyChart, go to [email or web address].    Your next appointment:   6 month(s)  The format for your next appointment:   In Person  Provider:   Sanda Klein, MD

## 2021-01-02 ENCOUNTER — Other Ambulatory Visit: Payer: Self-pay

## 2021-01-02 ENCOUNTER — Ambulatory Visit (INDEPENDENT_AMBULATORY_CARE_PROVIDER_SITE_OTHER): Payer: Medicare Other

## 2021-01-02 DIAGNOSIS — I480 Paroxysmal atrial fibrillation: Secondary | ICD-10-CM | POA: Diagnosis not present

## 2021-01-02 DIAGNOSIS — Z5181 Encounter for therapeutic drug level monitoring: Secondary | ICD-10-CM

## 2021-01-02 DIAGNOSIS — I482 Chronic atrial fibrillation, unspecified: Secondary | ICD-10-CM

## 2021-01-02 LAB — LIPID PANEL
Chol/HDL Ratio: 3.5 ratio (ref 0.0–4.4)
Cholesterol, Total: 204 mg/dL — ABNORMAL HIGH (ref 100–199)
HDL: 59 mg/dL (ref >39)
LDL Chol Calc (NIH): 130 mg/dL — ABNORMAL HIGH (ref 0–99)
Triglycerides: 84 mg/dL (ref 0–149)
VLDL Cholesterol Cal: 15 mg/dL (ref 5–40)

## 2021-01-02 LAB — POCT INR: INR: 1.3 — AB (ref 2.0–3.0)

## 2021-01-02 NOTE — Patient Instructions (Signed)
-   Take 2 tablets of warfarin today and then  - continue taking warfarin 1 tablet daily - Recheck INR in 1 week Coumadin Clinic [phone].

## 2021-01-08 ENCOUNTER — Ambulatory Visit: Payer: Medicare Other | Attending: Internal Medicine

## 2021-01-08 ENCOUNTER — Encounter: Payer: Self-pay | Admitting: Internal Medicine

## 2021-01-08 ENCOUNTER — Ambulatory Visit (INDEPENDENT_AMBULATORY_CARE_PROVIDER_SITE_OTHER): Payer: Medicare Other | Admitting: Internal Medicine

## 2021-01-08 ENCOUNTER — Other Ambulatory Visit: Payer: Self-pay

## 2021-01-08 VITALS — BP 142/70 | HR 57 | Temp 97.7°F | Resp 18 | Ht 69.0 in | Wt 170.2 lb

## 2021-01-08 DIAGNOSIS — E785 Hyperlipidemia, unspecified: Secondary | ICD-10-CM

## 2021-01-08 DIAGNOSIS — I255 Ischemic cardiomyopathy: Secondary | ICD-10-CM | POA: Diagnosis not present

## 2021-01-08 DIAGNOSIS — Z23 Encounter for immunization: Secondary | ICD-10-CM | POA: Diagnosis not present

## 2021-01-08 DIAGNOSIS — I429 Cardiomyopathy, unspecified: Secondary | ICD-10-CM | POA: Diagnosis not present

## 2021-01-08 DIAGNOSIS — I10 Essential (primary) hypertension: Secondary | ICD-10-CM

## 2021-01-08 DIAGNOSIS — N184 Chronic kidney disease, stage 4 (severe): Secondary | ICD-10-CM | POA: Diagnosis not present

## 2021-01-08 NOTE — Progress Notes (Signed)
   Covid-19 Vaccination Clinic  Name:  [name] [name]    MRN: [id number] DOB: [birth year]  01/08/2021  [name] [name] was observed post Covid-19 immunization for 15 minutes without incident. She was provided with Vaccine Information Sheet and instruction to access the V-Safe system.   [name] [name] was instructed to call 911 with any severe reactions post vaccine: Difficulty breathing  Swelling of face and throat  A fast heartbeat  A bad rash all over body  Dizziness and weakness   Immunizations Administered     Name Date Dose VIS Date Route   Pfizer Covid-19 Vaccine Bivalent Booster 01/08/2021 11:40 AM 0.3 mL 10/30/2020 Intramuscular   Manufacturer: [place]   Lot: [id number]   DISH: [place], PharmD, MBA Clinical Acute Care Pharmacist

## 2021-01-08 NOTE — Patient Instructions (Signed)
Check the  blood pressure 2 or 3 times a  week   BP GOAL is between 110/65 and  130/80 If it is consistently higher or lower, let me know       GO TO THE FRONT DESK, PLEASE SCHEDULE YOUR APPOINTMENTS Come back for  a check up in 3 to 4 months

## 2021-01-08 NOTE — Progress Notes (Signed)
Subjective:    Patient ID: [name] [name], female    DOB: 08/16/1940, 80 y.o.   MRN: [id number]  DOS:  01/08/2021 Type of visit - description: ROV  Since the last office visit he is doing well. Good medication compliance. He saw cardiology, note reviewed. Saw nephrology, note reviewed.  Review of Systems Denies chest pain no difficulty breathing at this point. 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   HYPERLIPIDEMIA 03/27/2006   HYPERTENSION 03/27/2006   Intertrochanteric fracture of right hip ([place]) 07/13/2012   LUNG NODULE 09/01/2006   Excision, Bx Benighn   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"   Osteopenia 2004   Dexa 2004 showed Osteopenia, DEXA 03/2007 normal   Osteopenia    Other chronic cystitis with hematuria    Pelvic fracture ([place] 06/16/2016   S/P fall   Recurrent urinary tract infection    Seeing Urology   RETINOPATHY, BACKGROUND NOS 03/27/2006   Seizures ([place])    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[place]) 9/38/1829    Past Surgical History:  Procedure Laterality Date   ANKLE FRACTURE SURGERY Bilateral 08/2015   "right one was in 2 places"   CARDIAC CATHETERIZATION N/A 05/17/2015   Procedure: Left Heart Cath and Coronary Angiography;  Surgeon: Jettie Booze, MD;  Location: [facility];  Service: Cardiovascular;  Laterality: N/A;   CARDIAC CATHETERIZATION N/A 05/17/2015   Procedure: Coronary Balloon Angioplasty;  Surgeon: Jettie Booze, MD;   Location: [facility];  Service: Cardiovascular;  Laterality: N/A;   FEMUR IM NAIL Right 07/15/2012   Procedure: INTRAMEDULLARY (IM) NAIL HIP;  Surgeon: Johnny Bridge, MD;  Location: [facility];  Service: Orthopedics;  Laterality: Right;   FEMUR IM NAIL Left 12/02/2015   Procedure: INTRAMEDULLARY (IM) NAIL FEMORAL;  Surgeon: [name], MD;  Location: [facility];  Service: Orthopedics;  Laterality: Left;   FRACTURE SURGERY     INCISION AND DRAINAGE HIP Left 01/03/2016   Procedure: IRRIGATION AND DEBRIDEMENT HIP;  Surgeon: [name], MD;  Location: [facility];  Service: Orthopedics;  Laterality: Left;   SHOULDER SURGERY Bilateral    "don't remember which side"    Allergies as of 01/08/2021       Reactions   Procaine Hcl Anaphylaxis   Amoxicillin Itching   Did it involve swelling of the face/tongue/throat, SOB, or low BP? No Did it involve sudden or severe rash/hives, skin peeling, or any reaction on the inside of your mouth or nose? No Did you need to seek medical attention at a hospital or doctor's office? No When did it last happen? unk If all above answers are "NO", may proceed with cephalosporin use.        Medication List        Accurate as of January 08, 2021 11:59 PM. If you have any questions, ask your nurse or doctor.  alendronate 35 MG tablet Commonly known as: FOSAMAX Take 35 mg by mouth every Monday. Take with a full glass of water on an empty stomach.   atorvastatin 80 MG tablet Commonly known as: LIPITOR Take 1 tablet by mouth once daily   ezetimibe 10 MG tablet Commonly known as: Zetia Take 1 tablet (10 mg total) by mouth daily.   furosemide 40 MG tablet Commonly known as: LASIX TAKE 1 TABLET BY MOUTH TWICE DAILY AS NEEDED FOR FLUID   metoprolol succinate 50 MG 24 hr tablet Commonly known as: Toprol XL Take 1 tablet (50 mg total) by mouth daily.   MULTIVITAMIN ADULT PO Take 1 tablet by mouth daily with breakfast.   NovoLIN 70/30 ReliOn  (70-30) 100 UNIT/ML injection Generic drug: insulin NPH-regular Human 23 units with breakfast and 4 units with the evening meal.   warfarin 2.5 MG tablet Commonly known as: COUMADIN Take as directed by the anticoagulation clinic. If you are unsure how to take this medication, talk to your nurse or doctor. Original instructions: Take 1 tablet by mouth daily or as directed by the coumadin clinic           Objective:   Physical Exam BP (!) 142/70 (BP Location: Left Arm, Patient Position: Sitting, Cuff Size: Normal)   Pulse (!) 57   Temp 97.7 F (36.5 C) (Oral)   Resp 18   Ht 5\' 9"  (1.753 m)   Wt 170 lb 3.2 oz (77.2 kg)   SpO2 99%   BMI 25.13 kg/m  General:   Well developed, NAD, BMI noted. HEENT:  Normocephalic . Face symmetric, atraumatic Lungs:  CTA B Normal respiratory effort, no intercostal retractions, no accessory muscle use. Heart: RRR,  no murmur.  Lower extremities: no pretibial edema bilaterally  Skin: Not pale. Not jaundice Neurologic:  alert & oriented X3.  Speech normal, gait limited, assisted by a walker Psych--  Cognition and judgment appear intact.  Cooperative with normal attention span and concentration.  Behavior appropriate. No anxious or depressed appearing.      Assessment       Assessment   DM  + retinopathy, Dr [name] HTN CKD:  -Creatinine 1.5 = GFR ~35 -Elevated creatinine(started after admission 05-2015, had IV contrast) Hyperlipidemia anxiety ativan GERD Anemia, normal iron 05-2015, never had a colonoscopy CV ---NSTEMI 05-2015, cath, angiplasty ---Echo, EF 40 % (05-2015) ---Atrial fibrillation, SVT -- dx 2007, cath 2007 mild CAD. S/p cardioversion, then ablation, on coumadin, last visit cards 2014  GU Urinary retention /UTI 05-2015, had a foley temporarily, saw urology, PVR 249 cc (high), rx timed voiding Neuro: -Herpes encephalitis 2014 -Seizures (after encephalitis), sz episode 08-2015 (?) - HAs Blindness, left eye Right eye  weakness after cataract surgery Osteoporosis:   DEXA 2004, DEXA 2009 normal,  dexa 2015 @ [facility] -1.8.   T score -1.8 in 2018.   Hip fracture 2017;L1 vertebral fracture 2020 >>> Rx  alendronate 07/2018 H/o lung bx 2008 benign   PLAN: DM: Per Endo CAD, paroxysmal A. fib, ischemic cardiomyopathy: Saw cardiology 12/18/2020, reported chest pain, felt not to be cardiac.  No change in management suggested, on Coumadin, no apparent complications.  Good compliance. High cholesterol: Last FLP ordered by cardiology. Hypertension, CKD, last visit with nephrology 11/01/2020, recommended BP goal less than 130/80 and A1c less than 7.0. BP today is a slightly higher than goal, she does check her BPs regularly but could not tell me any readings today.  Recommend to continue checking, see AVS. Preventive  care: Flu shot today, Bivalent covid vax rec  RTC 3 to 4 months   This visit occurred during the SARS-CoV-2 public health emergency.  Safety protocols were in place, including screening questions prior to the visit, additional usage of staff PPE, and extensive cleaning of exam room while observing appropriate contact time as indicated for disinfecting solutions.

## 2021-01-09 ENCOUNTER — Ambulatory Visit (INDEPENDENT_AMBULATORY_CARE_PROVIDER_SITE_OTHER): Payer: Medicare Other

## 2021-01-09 ENCOUNTER — Telehealth: Payer: Self-pay

## 2021-01-09 DIAGNOSIS — Z5181 Encounter for therapeutic drug level monitoring: Secondary | ICD-10-CM

## 2021-01-09 DIAGNOSIS — I480 Paroxysmal atrial fibrillation: Secondary | ICD-10-CM

## 2021-01-09 DIAGNOSIS — E7800 Pure hypercholesterolemia, unspecified: Secondary | ICD-10-CM

## 2021-01-09 DIAGNOSIS — I482 Chronic atrial fibrillation, unspecified: Secondary | ICD-10-CM

## 2021-01-09 LAB — POCT INR: INR: 1.8 — AB (ref 2.0–3.0)

## 2021-01-09 NOTE — Telephone Encounter (Addendum)
Called patient regarding results for Lipid Panel. Advised Patient Per Caron Presume wants a referral to Pharmacy. Patient agreed.----- Message from Warren Lacy, PA-C sent at 01/09/2021 10:47 AM EST ----- Bad cholesterol (LDL) remains too high at 130.  Your bad cholesterol was better controlled in 2018-2020 when it was less than 70.  Are you taking the Lipitor and Zetia regularly?  If you are taking your cholesterol medications, then I recommend referring you to our lipid pharmacist.  There are medications that they are able to get insurance approval with that can get your bad cholesterol to goal.  We want your LDL less than 70 to prevent plaque buildup in the heart arteries.

## 2021-01-09 NOTE — Assessment & Plan Note (Signed)
DM: Per Endo CAD, paroxysmal A. fib, ischemic cardiomyopathy: Saw cardiology 12/18/2020, reported chest pain, felt not to be cardiac.  No change in management suggested, on Coumadin, no apparent complications.  Good compliance. High cholesterol: Last FLP ordered by cardiology. Hypertension, CKD, last visit with nephrology 11/01/2020, recommended BP goal less than 130/80 and A1c less than 7.0. BP today is a slightly higher than goal, she does check her BPs regularly but could not tell me any readings today.  Recommend to continue checking, see AVS. Preventive care: Flu shot today, Bivalent covid vax rec  RTC 3 to 4 months

## 2021-01-09 NOTE — Patient Instructions (Signed)
Description    - Take 2 tablets of warfarin today and 1.5 tablets tomorrow and then continue taking warfarin 1 tablet daily - Recheck INR in 1 week Coumadin Clinic [phone].

## 2021-01-19 ENCOUNTER — Other Ambulatory Visit: Payer: Self-pay | Admitting: Cardiovascular Disease

## 2021-01-19 DIAGNOSIS — I480 Paroxysmal atrial fibrillation: Secondary | ICD-10-CM

## 2021-01-27 ENCOUNTER — Other Ambulatory Visit (HOSPITAL_BASED_OUTPATIENT_CLINIC_OR_DEPARTMENT_OTHER): Payer: Self-pay

## 2021-01-27 MED ORDER — PFIZER COVID-19 VAC BIVALENT 30 MCG/0.3ML IM SUSP
INTRAMUSCULAR | 0 refills | Status: DC
  Filled 2021-01-27: qty 0.3, 1d supply, fill #0

## 2021-01-30 ENCOUNTER — Other Ambulatory Visit: Payer: Self-pay

## 2021-01-30 ENCOUNTER — Ambulatory Visit (INDEPENDENT_AMBULATORY_CARE_PROVIDER_SITE_OTHER): Payer: Medicare Other

## 2021-01-30 DIAGNOSIS — I482 Chronic atrial fibrillation, unspecified: Secondary | ICD-10-CM

## 2021-01-30 DIAGNOSIS — I480 Paroxysmal atrial fibrillation: Secondary | ICD-10-CM | POA: Diagnosis not present

## 2021-01-30 DIAGNOSIS — Z5181 Encounter for therapeutic drug level monitoring: Secondary | ICD-10-CM

## 2021-01-30 LAB — POCT INR: INR: 2.1 (ref 2.0–3.0)

## 2021-01-30 NOTE — Patient Instructions (Signed)
Description   Continue taking warfarin 1 tablet daily. Recheck INR in 3 weeks. Coumadin Clinic [phone].      

## 2021-02-05 ENCOUNTER — Ambulatory Visit (INDEPENDENT_AMBULATORY_CARE_PROVIDER_SITE_OTHER): Payer: Medicare Other | Admitting: Pharmacist Clinician (PhC)/ Clinical Pharmacy Specialist

## 2021-02-05 ENCOUNTER — Other Ambulatory Visit: Payer: Self-pay

## 2021-02-05 DIAGNOSIS — E782 Mixed hyperlipidemia: Secondary | ICD-10-CM | POA: Diagnosis not present

## 2021-02-05 DIAGNOSIS — I255 Ischemic cardiomyopathy: Secondary | ICD-10-CM | POA: Diagnosis not present

## 2021-02-05 MED ORDER — NEXLETOL 180 MG PO TABS: 180.0000 mg | ORAL_TABLET | Freq: Every day | ORAL | 0 refills | Status: DC

## 2021-02-05 NOTE — Progress Notes (Signed)
02/05/2021 [name] [name] 01-15-1941 [id number]   HPI:  [name] [name] is a 80 y.o. female patient of Dr Oval Linsey, who presents today for a lipid clinic evaluation.  See pertinent past medical history below.  Past Medical History: ASCVD NSTEMI 05/2015 angioplasty to OM1, ischemic cardiomyopathy  CHF HFrEF - EF improved to 45-50% by echo (9/22)  AF Paroxysmal, s/p ablation, on warfarin, CHADS2-VASc score  hypertension Controlled, on metoprolol succ  DM2 8/22 A1c 8.9, on novolin 70/30  CKD 8/22 SCr 1.57, GFR 34    Current Medications: atorvastatin 80 mg, ezetimibe 10  Cholesterol Goals: LDL < 55   Intolerant/previously tried: pravastatin, simvastatin - myalgias  Family history: father died at 77 (was never told cause of death); mother died at 65 (no heart disease - died in her sleep); 1 sister died at 11 - DM, alcohol, tobacco; other living and healthy; son died at 60 (drugs), 2 grandchildren healthy  Diet: eats home cooked meals, husband does the cooking - mostly prepared foods, some vegetables  Exercise:  none, uses walker for mobility  Labs: 11/22: TC 204, TG 84, HDL 59, LDL 130   Current Outpatient Medications  Medication Sig Dispense Refill   alendronate (FOSAMAX) 35 MG tablet Take 35 mg by mouth every Monday. Take with a full glass of water on an empty stomach.     atorvastatin (LIPITOR) 80 MG tablet Take 1 tablet by mouth once daily 90 tablet 0   Bempedoic Acid (NEXLETOL) 180 MG TABS Take 180 mg by mouth daily. 21 tablet 0   ezetimibe (ZETIA) 10 MG tablet Take 1 tablet (10 mg total) by mouth daily. 30 tablet 5   furosemide (LASIX) 40 MG tablet TAKE 1 TABLET BY MOUTH TWICE DAILY AS NEEDED FOR FLUID 180 tablet 1   insulin NPH-regular Human (NOVOLIN 70/30 RELION) (70-30) 100 UNIT/ML injection 23 units with breakfast and 4 units with the evening meal. 20 mL 3   metoprolol succinate (TOPROL XL) 50 MG 24 hr tablet Take 1 tablet (50 mg total) by mouth daily. 30 tablet 5    Multiple Vitamins-Minerals (MULTIVITAMIN ADULT PO) Take 1 tablet by mouth daily with breakfast.      warfarin (COUMADIN) 2.5 MG tablet TAKE AS DIRECTED BY MOUTH PER COUMADIN CLINIC 100 tablet 0   COVID-19 mRNA bivalent vaccine, Pfizer, (PFIZER COVID-19 VAC BIVALENT) injection Inject into the muscle. 0.3 mL 0   No current facility-administered medications for this visit.    Allergies  Allergen Reactions   Procaine Hcl Anaphylaxis   Amoxicillin Itching     Did it involve swelling of the face/tongue/throat, SOB, or low BP? No Did it involve sudden or severe rash/hives, skin peeling, or any reaction on the inside of your mouth or nose? No Did you need to seek medical attention at a hospital or doctor's office? No When did it last happen? unk If all above answers are "NO", may proceed with cephalosporin use.     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   Hyperlipidemia Patient with ASCVD and elevated LDL cholesterol not at goal despite high intensity statin use with ezetimibe.  Reviewed options for lowering LDL cholesterol, including PCSK-9 inhibitors, bempedoic acid and inclisiran.  Discussed mechanisms of action, dosing, side effects and potential decreases in LDL cholesterol.  Also reviewed cost information and potential options for patient assistance.  Answered all patient questions.  Based on this information, patient would prefer to start Nexletol.  She was given samples for 3 weeks, and we will start the prior authorization process.  Because her plan has a $500 brand deductible per year, we will have her wait until January 2 to go to her pharmacy to get medication.  Samples should last until about a week prior to that.     Tommy Medal PharmD CPP [facility] [address] [place], [place] [address] [phone]

## 2021-02-05 NOTE — Assessment & Plan Note (Signed)
Patient with ASCVD and elevated LDL cholesterol not at goal despite high intensity statin use with ezetimibe.  Reviewed options for lowering LDL cholesterol, including PCSK-9 inhibitors, bempedoic acid and inclisiran.  Discussed mechanisms of action, dosing, side effects and potential decreases in LDL cholesterol.  Also reviewed cost information and potential options for patient assistance.  Answered all patient questions.  Based on this information, patient would prefer to start Nexletol.  She was given samples for 3 weeks, and we will start the prior authorization process.  Because her plan has a $500 brand deductible per year, we will have her wait until January 2 to go to her pharmacy to get medication.  Samples should last until about a week prior to that.

## 2021-02-05 NOTE — Patient Instructions (Signed)
Your Results:             Your most recent labs Goal  Total Cholesterol 204 < 200  Triglycerides 84 < 150  HDL (happy/good cholesterol) 59 > 40  LDL (lousy/bad cholesterol 130 < 55   Medication changes:  We will start the prior authorization to get Nexletol covered by your insurance company.    Take 1 tablet daily, with or without meals  Lab orders:  Repeat cholesterol labs after 3 months.     Patient Assistance:  The [employer] foundation offers assistance to help pay for medication copays.  They will cover copays for all cholesterol lowering meds, including statins, fibrates, omega-3 oils, ezetimibe, Repatha, Praluent, Nexletol, Nexlizet.  The cards are usually good for $2,500 or 12 months, whichever comes first. Go to healthwellfoundation.org Click on "Apply Now" Answer questions as to whom is applying (patient or representative) Your disease fund will be "hypercholesterolemia - Medicare access" They will ask questions about finances and which medications you are taking for cholesterol When you submit, the approval is usually within minutes.  You will need to print the card information from the site You will need to show this information to your pharmacy, they will bill your Medicare Part D plan first -then bill [employer] Well --for the copay.   You can also call them at [phone], although the hold times can be quite long.   Thank you for choosing [facility]

## 2021-02-20 ENCOUNTER — Ambulatory Visit (INDEPENDENT_AMBULATORY_CARE_PROVIDER_SITE_OTHER): Payer: Medicare Other

## 2021-02-20 ENCOUNTER — Other Ambulatory Visit: Payer: Self-pay

## 2021-02-20 DIAGNOSIS — I482 Chronic atrial fibrillation, unspecified: Secondary | ICD-10-CM

## 2021-02-20 DIAGNOSIS — I480 Paroxysmal atrial fibrillation: Secondary | ICD-10-CM | POA: Diagnosis not present

## 2021-02-20 DIAGNOSIS — Z5181 Encounter for therapeutic drug level monitoring: Secondary | ICD-10-CM

## 2021-02-20 LAB — POCT INR: INR: 2.3 (ref 2.0–3.0)

## 2021-02-20 NOTE — Patient Instructions (Signed)
Description   Continue taking warfarin 1 tablet daily. Recheck INR in 4 weeks. Coumadin Clinic [phone].      

## 2021-02-24 ENCOUNTER — Other Ambulatory Visit: Payer: Self-pay | Admitting: Internal Medicine

## 2021-02-24 ENCOUNTER — Other Ambulatory Visit: Payer: Self-pay | Admitting: Physician Assistant

## 2021-03-14 ENCOUNTER — Other Ambulatory Visit: Payer: Self-pay

## 2021-03-14 ENCOUNTER — Ambulatory Visit (INDEPENDENT_AMBULATORY_CARE_PROVIDER_SITE_OTHER): Payer: Medicare Other | Admitting: Endocrinology

## 2021-03-14 VITALS — BP 167/90 | HR 75 | Ht 69.0 in | Wt 167.4 lb

## 2021-03-14 DIAGNOSIS — N1832 Chronic kidney disease, stage 3b: Secondary | ICD-10-CM | POA: Diagnosis not present

## 2021-03-14 DIAGNOSIS — E1122 Type 2 diabetes mellitus with diabetic chronic kidney disease: Secondary | ICD-10-CM

## 2021-03-14 DIAGNOSIS — Z794 Long term (current) use of insulin: Secondary | ICD-10-CM | POA: Diagnosis not present

## 2021-03-14 DIAGNOSIS — E1159 Type 2 diabetes mellitus with other circulatory complications: Secondary | ICD-10-CM

## 2021-03-14 LAB — POCT GLYCOSYLATED HEMOGLOBIN (HGB A1C): Hemoglobin A1C: 8.3 % — AB (ref 4.0–5.6)

## 2021-03-14 MED ORDER — NOVOLIN 70/30 RELION (70-30) 100 UNIT/ML ~~LOC~~ SUSP: SUBCUTANEOUS | 3 refills | Status: DC

## 2021-03-14 NOTE — Progress Notes (Signed)
Subjective:    Patient ID: [name] [name], female    DOB: [birth year], 81 y.o.   MRN: [id number]  HPI Pt returns for f/u of diabetes mellitus:   DM type: Insulin-requiring type 2.  Dx'ed: 0300 Complications: PN, CAD, stage 3b CRI, and DR.  Therapy: insulin since soon after dx.  GDM: never.  DKA: never.  Severe hypoglycemia: last episode was in mid-2018.  Pancreatitis: never.  SDOH: She takes human insulin, due to cost.  Other: she is on BID premixed insulin, due to noncompliance with multiple daily injections.  Interval history: no cbg record, but states cbg's vary from 61-231.  It is in general higher as the day goes on.  pt states she feels well in general.  Pt says she never misses the insulin.  She seldom has hypoglycemia, and these episodes are mild.  This happens fasting.   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   HYPERLIPIDEMIA 03/27/2006   HYPERTENSION 03/27/2006   Intertrochanteric fracture of right hip ([place]) 07/13/2012   LUNG NODULE 09/01/2006   Excision, Bx Benighn   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"   Osteopenia 2004   Dexa 2004 showed Osteopenia, DEXA 03/2007 normal   Osteopenia    Other chronic cystitis with hematuria    Pelvic fracture ([place]) 06/16/2016   S/P fall   Recurrent urinary tract infection    Seeing Urology   RETINOPATHY, BACKGROUND NOS 03/27/2006   Seizures ([place])    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[place]) 11/22/3005    Past Surgical History:  Procedure Laterality Date   ANKLE FRACTURE SURGERY Bilateral  08/2015   "right one was in 2 places"   CARDIAC CATHETERIZATION N/A 05/17/2015   Procedure: Left Heart Cath and Coronary Angiography;  Surgeon: Jettie Booze, MD;  Location: [facility];  Service: Cardiovascular;  Laterality: N/A;   CARDIAC CATHETERIZATION N/A 05/17/2015   Procedure: Coronary Balloon Angioplasty;  Surgeon: Jettie Booze, MD;  Location: [facility];  Service: Cardiovascular;  Laterality: N/A;   FEMUR IM NAIL Right 07/15/2012   Procedure: INTRAMEDULLARY (IM) NAIL HIP;  Surgeon: Johnny Bridge, MD;  Location: [facility];  Service: Orthopedics;  Laterality: Right;   FEMUR IM NAIL Left 12/02/2015   Procedure: INTRAMEDULLARY (IM) NAIL FEMORAL;  Surgeon: [name], MD;  Location: [facility];  Service: Orthopedics;  Laterality: Left;   FRACTURE SURGERY     INCISION AND DRAINAGE HIP Left 01/03/2016   Procedure: IRRIGATION AND DEBRIDEMENT HIP;  Surgeon: [name], MD;  Location: [facility];  Service: Orthopedics;  Laterality: Left;   SHOULDER SURGERY Bilateral    "don't remember which side"    Social History   Socioeconomic History   Marital status: Married    Spouse name: [name]   Number of children: 1   Years of education: College   Highest education level: Not on file  Occupational History   Occupation: retired     Fish farm manager: RETIRED  Tobacco Use   Smoking status: Former    Types: Cigarettes    Quit date: 03/02/1978    Years since quitting: 43.0   Smokeless tobacco: Never  Vaping Use   Vaping Use: Never used  Substance and Sexual Activity   Alcohol use: No    Alcohol/week: 0.0 standard drinks   Drug use: No   Sexual activity: Not on file  Other Topics Concern   Not on file  Social History Narrative   Patient lives at home spouse. Uses a walker consistently.   Moved from [place] 2004   1 son, problems w/ drugs, passed away Mar 05, 2016-- OD       Social Determinants of Health   Financial Resource Strain: Low Risk    Difficulty of Paying Living Expenses: Not  hard at all  Food Insecurity: No Food Insecurity   Worried About Charity fundraiser in the Last Year: Never true   [place] in the Last Year: Never true  Transportation Needs: No Transportation Needs   Lack of Transportation (Medical): No   Lack of Transportation (Non-Medical): No  Physical Activity: Inactive   Days of Exercise per Week: 0 days   Minutes of Exercise per Session: 0 min  Stress: No Stress Concern Present   Feeling of Stress : Not at all  Social Connections: Moderately Integrated   Frequency of Communication with Friends and Family: More than three times a week   Frequency of Social Gatherings with Friends and Family: More than three times a week   Attends Religious Services: More than 4 times per year   Active Member of [employer] or Organizations: No   Attends Archivist Meetings: Never   Marital Status: Married  Human resources officer Violence: Not At Risk   Fear of Current or Ex-Partner: No   Emotionally Abused: No   Physically Abused: No   Sexually Abused: No    Current Outpatient Medications on File Prior to Visit  Medication Sig Dispense Refill   alendronate (FOSAMAX) 35 MG tablet TAKE 1 TABLET BY MOUTH ONCE A WEEK IN THE MORNING WITH A FULL GLASS OF WATER, ON AN EMPTY STOMACH 12 tablet 3   atorvastatin (LIPITOR) 80 MG tablet Take 1 tablet by mouth once daily 90 tablet 0   Bempedoic Acid (NEXLETOL) 180 MG TABS Take 180 mg by mouth daily. 21 tablet 0   COVID-19 mRNA bivalent vaccine, Pfizer, (PFIZER COVID-19 VAC BIVALENT) injection Inject into the muscle. 0.3 mL 0   ezetimibe (ZETIA) 10 MG tablet Take 1 tablet by mouth once daily 30 tablet 0   furosemide (LASIX) 40 MG tablet TAKE 1 TABLET BY MOUTH TWICE DAILY AS NEEDED FOR FLUID 180 tablet 1   metoprolol succinate (TOPROL XL) 50 MG 24 hr tablet Take 1 tablet (50 mg total) by mouth daily. 30 tablet 5   Multiple Vitamins-Minerals (MULTIVITAMIN ADULT PO) Take 1 tablet by mouth daily with breakfast.       warfarin (COUMADIN) 2.5 MG tablet TAKE AS DIRECTED BY MOUTH PER COUMADIN CLINIC 100 tablet 0   No current facility-administered medications on file prior to visit.    Allergies  Allergen Reactions   Procaine Hcl Anaphylaxis   Amoxicillin Itching     Did it involve swelling of the face/tongue/throat, SOB, or low BP? No Did it involve sudden or severe rash/hives, skin peeling, or any reaction on the inside of your mouth or nose? No Did you need to seek medical attention at a hospital or  doctor's office? No When did it last happen? unk If all above answers are "NO", may proceed with cephalosporin use.     Family History  Problem Relation Age of Onset   Diabetes Father    Heart attack Father 66   Diabetes Sister    Drug abuse Son    Cancer Neg Hx        no hx of colon or breast cancer    BP (!) 167/90    Pulse 75    Ht 5\' 9"  (1.753 m)    Wt 167 lb 6.4 oz (75.9 kg)    SpO2 96%    BMI 24.72 kg/m    Review of Systems     Objective:   Physical Exam    Lab Results  Component Value Date   HGBA1C 8.3 (A) 03/14/2021      Assessment & Plan:  Insulin-requiring type 2 DM: uncontrolled.   Hypoglycemia, due to insulin: Based on the pattern of her cbg's, she needs some adjustment in her therapy  Patient Instructions  Your blood pressure is high today.  Please see your primary care provider soon, to have it rechecked.   Please increase the insulin to 26 units with breakfast and 2 units with the evening meal  On this type of insulin schedule, you should eat meals on a regular schedule.  If a meal is missed or significantly delayed, your blood sugar could go low.    check your blood sugar twice a day.  vary the time of day when you check, between before the 3 meals, and at bedtime.  also check if you have symptoms of your blood sugar being too high or too low.  please keep a record of the readings and bring it to your next appointment here.  please call us sooner if your blood sugar  goes below 70, or if you have a lot of readings over 200. Please come back for a follow-up appointment in 3 months.

## 2021-03-14 NOTE — Patient Instructions (Addendum)
Your blood pressure is high today.  Please see your primary care provider soon, to have it rechecked.   Please increase the insulin to 26 units with breakfast and 2 units with the evening meal  On this type of insulin schedule, you should eat meals on a regular schedule.  If a meal is missed or significantly delayed, your blood sugar could go low.    check your blood sugar twice a day.  vary the time of day when you check, between before the 3 meals, and at bedtime.  also check if you have symptoms of your blood sugar being too high or too low.  please keep a record of the readings and bring it to your next appointment here.  please call us sooner if your blood sugar goes below 70, or if you have a lot of readings over 200. Please come back for a follow-up appointment in 3 months.

## 2021-03-20 ENCOUNTER — Other Ambulatory Visit: Payer: Self-pay

## 2021-03-20 ENCOUNTER — Ambulatory Visit (INDEPENDENT_AMBULATORY_CARE_PROVIDER_SITE_OTHER): Payer: Medicare Other | Admitting: *Deleted

## 2021-03-20 DIAGNOSIS — Z5181 Encounter for therapeutic drug level monitoring: Secondary | ICD-10-CM | POA: Diagnosis not present

## 2021-03-20 DIAGNOSIS — I482 Chronic atrial fibrillation, unspecified: Secondary | ICD-10-CM

## 2021-03-20 DIAGNOSIS — I480 Paroxysmal atrial fibrillation: Secondary | ICD-10-CM

## 2021-03-20 LAB — POCT INR: INR: 3.5 — AB (ref 2.0–3.0)

## 2021-03-20 NOTE — Patient Instructions (Addendum)
Description   Do not take any Warfarin today then continue taking warfarin 1 tablet daily. Recheck INR in 3 weeks. Coumadin Clinic [phone].

## 2021-04-02 ENCOUNTER — Ambulatory Visit: Payer: Medicare Other | Admitting: Internal Medicine

## 2021-04-08 ENCOUNTER — Ambulatory Visit (HOSPITAL_BASED_OUTPATIENT_CLINIC_OR_DEPARTMENT_OTHER)
Admission: RE | Admit: 2021-04-08 | Discharge: 2021-04-08 | Disposition: A | Discharge status: Home / Self Care | Payer: Medicare Other | Source: Ambulatory Visit | Attending: Internal Medicine | Admitting: Internal Medicine

## 2021-04-08 ENCOUNTER — Encounter: Payer: Self-pay | Admitting: Internal Medicine

## 2021-04-08 ENCOUNTER — Other Ambulatory Visit: Payer: Self-pay

## 2021-04-08 ENCOUNTER — Ambulatory Visit (INDEPENDENT_AMBULATORY_CARE_PROVIDER_SITE_OTHER): Payer: Medicare Other | Admitting: Internal Medicine

## 2021-04-08 VITALS — BP 116/64 | HR 58 | Temp 98.0°F | Resp 16 | Ht 69.0 in

## 2021-04-08 DIAGNOSIS — R42 Dizziness and giddiness: Secondary | ICD-10-CM

## 2021-04-08 DIAGNOSIS — I10 Essential (primary) hypertension: Secondary | ICD-10-CM

## 2021-04-08 DIAGNOSIS — R7989 Other specified abnormal findings of blood chemistry: Secondary | ICD-10-CM | POA: Diagnosis not present

## 2021-04-08 DIAGNOSIS — R519 Headache, unspecified: Secondary | ICD-10-CM | POA: Diagnosis not present

## 2021-04-08 LAB — BASIC METABOLIC PANEL
BUN: 37 mg/dL — ABNORMAL HIGH (ref 6–23)
CO2: 33 mEq/L — ABNORMAL HIGH (ref 19–32)
Calcium: 9.6 mg/dL (ref 8.4–10.5)
Chloride: 96 mEq/L (ref 96–112)
Creatinine, Ser: 2.18 mg/dL — ABNORMAL HIGH (ref 0.40–1.20)
GFR: 21.06 mL/min — ABNORMAL LOW (ref >60.00)
Glucose, Bld: 235 mg/dL — ABNORMAL HIGH (ref 70–99)
Potassium: 4.4 mEq/L (ref 3.5–5.1)
Sodium: 136 mEq/L (ref 135–145)

## 2021-04-08 LAB — CBC WITH DIFFERENTIAL/PLATELET
Basophils Absolute: 0.1 10*3/uL (ref 0.0–0.1)
Basophils Relative: 0.7 % (ref 0.0–3.0)
Eosinophils Absolute: 0.1 10*3/uL (ref 0.0–0.7)
Eosinophils Relative: 1.8 % (ref 0.0–5.0)
HCT: 39.2 % (ref 36.0–46.0)
Hemoglobin: 12.7 g/dL (ref 12.0–15.0)
Lymphocytes Relative: 24.8 % (ref 12.0–46.0)
Lymphs Abs: 2.1 10*3/uL (ref 0.7–4.0)
MCHC: 32.4 g/dL (ref 30.0–36.0)
MCV: 92.7 fl (ref 78.0–100.0)
Monocytes Absolute: 0.8 10*3/uL (ref 0.1–1.0)
Monocytes Relative: 9.4 % (ref 3.0–12.0)
Neutro Abs: 5.4 10*3/uL (ref 1.4–7.7)
Neutrophils Relative %: 63.3 % (ref 43.0–77.0)
Platelets: 484 10*3/uL — ABNORMAL HIGH (ref 150.0–400.0)
RBC: 4.23 Mil/uL (ref 3.87–5.11)
RDW: 13.2 % (ref 11.5–15.5)
WBC: 8.5 10*3/uL (ref 4.0–10.5)

## 2021-04-08 NOTE — Patient Instructions (Addendum)
Per our records you are due for your diabetic eye exam. Please contact your eye doctor to schedule an appointment. Please have them send copies of your office visit notes to [place]. Our fax number is [phone]. If you need a referral to an eye doctor please let us know.   Check the  blood pressure regularly BP GOAL is between 110/65 and  135/85. If it is consistently higher or lower, let me know  We are referring you back to neurology for the headache and dizziness. Please call anytime or seek medical attention if you have ongoing problems or severe-different symptoms  GO TO THE LAB : Get the blood work     [place], [place] back for   a checkup in 3 months   STOP BY THE FIRST FLOOR: Get CT head

## 2021-04-08 NOTE — Progress Notes (Addendum)
Subjective:    Patient ID: [name] [name], female    DOB: August 31, 1940, 81 y.o.   MRN: [id number]  DOS:  04/08/2021 Type of visit - description: f/u  Here for routine follow-up however she has not been feeling well. Since last week he is having on and off dizziness, described as spinning, random, at rest or when she is doing things at home.  Decreased with laying down. May last few hours to a whole day. Associated with some nausea but no vomiting. Denies diplopia or motor deficits. She is drinking plenty of fluids. This is going on for many years since she had meningitis.  Also, yesterday developed headache around 4 PM, similar to previous headaches, located at the top of the head and described as pressure.  She denies any fever or chills.  No recent URI symptoms.  Ambulatory CBGs are not low.  She also continues with chest pain, has been evaluated by cardiology for the last few months.  Notes reviewed.   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   HYPERLIPIDEMIA 03/27/2006   HYPERTENSION 03/27/2006   Intertrochanteric fracture of right hip ([place]) 07/13/2012   LUNG NODULE 09/01/2006   Excision, Bx [facility]   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"   Osteopenia 2004   Dexa 2004 showed Osteopenia, DEXA 03/2007 normal   Osteopenia    Other chronic cystitis with hematuria    Pelvic fracture ([place]) 06/16/2016   S/P fall   Recurrent urinary tract infection    Seeing Urology   RETINOPATHY,  BACKGROUND NOS 03/27/2006   Seizures ([place])    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[place]) 0/03/7492    Past Surgical History:  Procedure Laterality Date   ANKLE FRACTURE SURGERY Bilateral 08/2015   "right one was in 2 places"   CARDIAC CATHETERIZATION N/A 05/17/2015   Procedure: Left Heart Cath and Coronary Angiography;  Surgeon: Jettie Booze, MD;  Location: [facility];  Service: Cardiovascular;  Laterality: N/A;   CARDIAC CATHETERIZATION N/A 05/17/2015   Procedure: Coronary Balloon Angioplasty;  Surgeon: Jettie Booze, MD;  Location: [facility];  Service: Cardiovascular;  Laterality: N/A;   FEMUR IM NAIL Right 07/15/2012   Procedure: INTRAMEDULLARY (IM) NAIL HIP;  Surgeon: Johnny Bridge, MD;  Location: [facility];  Service: Orthopedics;  Laterality: Right;   FEMUR IM NAIL Left 12/02/2015   Procedure: INTRAMEDULLARY (IM) NAIL FEMORAL;  Surgeon: [name], MD;  Location: [facility];  Service: Orthopedics;  Laterality: Left;   FRACTURE SURGERY     INCISION AND DRAINAGE HIP Left 01/03/2016   Procedure: IRRIGATION AND DEBRIDEMENT HIP;  Surgeon: [name], MD;  Location: [facility];  Service: Orthopedics;  Laterality: Left;   SHOULDER SURGERY Bilateral    "don't remember which side"    Current Outpatient Medications  Medication Instructions   alendronate (FOSAMAX) 35 MG tablet TAKE 1 TABLET BY MOUTH ONCE A WEEK IN  THE MORNING WITH A FULL GLASS OF WATER, ON AN EMPTY STOMACH   atorvastatin (LIPITOR) 80 MG tablet Take 1 tablet by mouth once daily   ezetimibe (ZETIA) 10 MG tablet Take 1 tablet by mouth once daily   furosemide (LASIX) 40 MG tablet TAKE 1 TABLET BY MOUTH TWICE DAILY AS NEEDED FOR FLUID   insulin NPH-regular Human (NOVOLIN 70/30 RELION) (70-30) 100 UNIT/ML injection 26 units with breakfast and 2 units with the evening meal.   metoprolol succinate (TOPROL XL) 50 mg, Oral, Daily   Multiple Vitamins-Minerals (MULTIVITAMIN ADULT PO) 1 tablet, Oral, Daily with breakfast   Nexletol 180 mg,  Oral, Daily   warfarin (COUMADIN) 2.5 MG tablet TAKE AS DIRECTED BY MOUTH PER COUMADIN CLINIC       Objective:   Physical Exam BP 116/64 (BP Location: Left Arm, Patient Position: Sitting, Cuff Size: Small)    Pulse (!) 58    Temp 98 F (36.7 C) (Oral)    Resp 16    Ht 5\' 9"  (1.753 m)    SpO2 98%    BMI 24.72 kg/m  General:   Well developed, looks very tired but nontoxic-appearing. HEENT:  Normocephalic . Face symmetric, atraumatic Lungs:  CTA B Normal respiratory effort, no intercostal retractions, no accessory muscle use. Heart: Bradycardic, seems regular.  Lower extremities: no pretibial edema bilaterally  Skin: Not pale. Not jaundice Neurologic:  alert & oriented X3.  Speech normal, gait at baseline, uses a walker. Face symmetric except for mild R ptosis, not new per patient. Motor symmetric. Psych--  Cognition and judgment appear intact.  Cooperative with normal attention span and concentration.  Behavior appropriate. No anxious or depressed appearing.      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   T score -1.8 in 2018.   Hip fracture 2017;L1 vertebral fracture 2020 >>> Rx  alendronate 07/2018 H/o lung bx 2008 benign    PLAN: Routine follow-up but has multiple symptoms Headache: Started last week, on and off, had similar headaches before.  I am somewhat concerned about the headache in the setting of anticoagulation with an INR more than 3 recently.  Will get stat CT head.  If symptoms severe recommend to seek medical attention. Dizziness: As described above, this is also chronic issue.  No stroke symptoms, described problem as dizziness, we are doing a CT head.  Recommend to call if this is not gradually better. History of seizures: Developed after herpes encephalitis, no typical seizure activity lately.Refer to neuro, not seen in years > management of HA and dizziness . HTN: BP today is very good, check BMP and CBC Chest pain: Still has some chest pain, has been evaluated by cardiology in the last few months.  They felt this could be related to a  hiatal hernia.  Zio patch [id number] with no sustained arrhythmias.  EP recommended no further adjustment of medication. Lexiscan 10-2020 negative. Recommend observation CAD, Paroxysmal Atrial Fibrillation/ Atrial Flutter s/p Ablation / History of SVT  See above  RTC 3 months      This visit occurred during the SARS-CoV-2 public health emergency.  Safety protocols were in place, including screening questions prior to the visit, additional usage of staff PPE, and extensive cleaning of exam room while observing appropriate contact time as indicated for disinfecting solutions.

## 2021-04-08 NOTE — Assessment & Plan Note (Addendum)
Routine follow-up but has multiple symptoms Headache: Started last week, on and off, had similar headaches before.  I am somewhat concerned about the headache in the setting of anticoagulation with an INR more than 3 recently.  Will get stat CT head.  If symptoms severe recommend to seek medical attention. Dizziness: As described above, this is also chronic issue.  No stroke symptoms, described problem as dizziness, we are doing a CT head.  Recommend to call if this is not gradually better. History of seizures: Developed after herpes encephalitis, no typical seizure activity lately.Refer to neuro, not seen in years > management of HA and dizziness . HTN: BP today is very good, check BMP and CBC Chest pain: Still has some chest pain, has been evaluated by cardiology in the last few months.  They felt this could be related to a  hiatal hernia. Zio patch [id number] with no sustained arrhythmias.  EP recommended no further adjustment of medication. Lexiscan 10-2020 negative. Recommend observation CAD, Paroxysmal Atrial Fibrillation/ Atrial Flutter s/p Ablation / History of SVT  See above  RTC 3 months

## 2021-04-09 NOTE — Addendum Note (Signed)
Addended byDamita Dunnings D on: 04/09/2021 02:58 PM   Modules accepted: Orders

## 2021-04-10 ENCOUNTER — Other Ambulatory Visit: Payer: Self-pay

## 2021-04-10 ENCOUNTER — Ambulatory Visit (INDEPENDENT_AMBULATORY_CARE_PROVIDER_SITE_OTHER): Payer: Medicare Other | Admitting: *Deleted

## 2021-04-10 DIAGNOSIS — I480 Paroxysmal atrial fibrillation: Secondary | ICD-10-CM | POA: Diagnosis not present

## 2021-04-10 DIAGNOSIS — Z5181 Encounter for therapeutic drug level monitoring: Secondary | ICD-10-CM | POA: Diagnosis not present

## 2021-04-10 DIAGNOSIS — I482 Chronic atrial fibrillation, unspecified: Secondary | ICD-10-CM | POA: Diagnosis not present

## 2021-04-10 LAB — POCT INR: INR: 4.4 — AB (ref 2.0–3.0)

## 2021-04-10 NOTE — Patient Instructions (Addendum)
Description   Do not take any Warfarin today and No Warfarin tomorrow then start taking warfarin 1 tablet daily except 1/2 tablet on Mondays. Add another serving of leafy veggies to your diet. Recheck INR in 2 weeks. [facility] [phone].

## 2021-04-11 ENCOUNTER — Other Ambulatory Visit: Payer: Self-pay | Admitting: Physician Assistant

## 2021-04-23 ENCOUNTER — Other Ambulatory Visit (INDEPENDENT_AMBULATORY_CARE_PROVIDER_SITE_OTHER): Payer: Medicare Other

## 2021-04-23 DIAGNOSIS — R7989 Other specified abnormal findings of blood chemistry: Secondary | ICD-10-CM | POA: Diagnosis not present

## 2021-04-23 LAB — BASIC METABOLIC PANEL
BUN: 35 mg/dL — ABNORMAL HIGH (ref 6–23)
CO2: 36 mEq/L — ABNORMAL HIGH (ref 19–32)
Calcium: 9.3 mg/dL (ref 8.4–10.5)
Chloride: 98 mEq/L (ref 96–112)
Creatinine, Ser: 1.93 mg/dL — ABNORMAL HIGH (ref 0.40–1.20)
GFR: 24.37 mL/min — ABNORMAL LOW (ref >60.00)
Glucose, Bld: 242 mg/dL — ABNORMAL HIGH (ref 70–99)
Potassium: 4.4 mEq/L (ref 3.5–5.1)
Sodium: 137 mEq/L (ref 135–145)

## 2021-04-24 ENCOUNTER — Other Ambulatory Visit: Payer: Self-pay | Admitting: Physician Assistant

## 2021-04-24 ENCOUNTER — Ambulatory Visit (INDEPENDENT_AMBULATORY_CARE_PROVIDER_SITE_OTHER): Payer: Medicare Other

## 2021-04-24 ENCOUNTER — Other Ambulatory Visit: Payer: Self-pay

## 2021-04-24 DIAGNOSIS — I480 Paroxysmal atrial fibrillation: Secondary | ICD-10-CM

## 2021-04-24 DIAGNOSIS — I482 Chronic atrial fibrillation, unspecified: Secondary | ICD-10-CM | POA: Diagnosis not present

## 2021-04-24 DIAGNOSIS — Z5181 Encounter for therapeutic drug level monitoring: Secondary | ICD-10-CM

## 2021-04-24 LAB — POCT INR: INR: 3 (ref 2.0–3.0)

## 2021-04-24 NOTE — Patient Instructions (Signed)
Description   Continue taking warfarin 1 tablet daily except 1/2 tablet on Mondays. Add another serving of leafy veggies to your diet. Recheck INR in 3 weeks. [facility] [phone].

## 2021-05-02 ENCOUNTER — Encounter: Payer: Self-pay | Admitting: Internal Medicine

## 2021-05-15 ENCOUNTER — Other Ambulatory Visit: Payer: Self-pay

## 2021-05-15 ENCOUNTER — Ambulatory Visit (INDEPENDENT_AMBULATORY_CARE_PROVIDER_SITE_OTHER): Payer: Medicare Other

## 2021-05-15 DIAGNOSIS — I482 Chronic atrial fibrillation, unspecified: Secondary | ICD-10-CM | POA: Diagnosis not present

## 2021-05-15 DIAGNOSIS — I480 Paroxysmal atrial fibrillation: Secondary | ICD-10-CM | POA: Diagnosis not present

## 2021-05-15 DIAGNOSIS — Z5181 Encounter for therapeutic drug level monitoring: Secondary | ICD-10-CM | POA: Diagnosis not present

## 2021-05-15 LAB — POCT INR: INR: 3.1 — AB (ref 2.0–3.0)

## 2021-05-15 NOTE — Patient Instructions (Signed)
Description   ?Eat a serving of greens today and continue taking warfarin 1 tablet daily except 1/2 tablet on Mondays.  ?Stay consistent with leafy veggies to your diet.  ?Recheck INR in 4 weeks.  ?[facility] [phone].  ?  ?   ?

## 2021-05-30 ENCOUNTER — Other Ambulatory Visit: Payer: Self-pay | Admitting: Physician Assistant

## 2021-06-11 ENCOUNTER — Ambulatory Visit (INDEPENDENT_AMBULATORY_CARE_PROVIDER_SITE_OTHER): Payer: Medicare Other | Admitting: *Deleted

## 2021-06-11 DIAGNOSIS — I482 Chronic atrial fibrillation, unspecified: Secondary | ICD-10-CM | POA: Diagnosis not present

## 2021-06-11 DIAGNOSIS — Z5181 Encounter for therapeutic drug level monitoring: Secondary | ICD-10-CM | POA: Diagnosis not present

## 2021-06-11 DIAGNOSIS — I480 Paroxysmal atrial fibrillation: Secondary | ICD-10-CM

## 2021-06-11 LAB — POCT INR: INR: 2.8 (ref 2.0–3.0)

## 2021-06-11 NOTE — Patient Instructions (Signed)
Description   ?Continue taking warfarin 1 tablet daily except 1/2 tablet on Mondays.  ?Stay consistent with leafy veggies to your diet.  ?Recheck INR in 5 weeks.  ?[facility] [phone].  ?  ? ? ?

## 2021-06-14 ENCOUNTER — Other Ambulatory Visit: Payer: Self-pay | Admitting: Physician Assistant

## 2021-06-14 ENCOUNTER — Other Ambulatory Visit: Payer: Self-pay | Admitting: Cardiovascular Disease

## 2021-06-14 DIAGNOSIS — I480 Paroxysmal atrial fibrillation: Secondary | ICD-10-CM

## 2021-06-16 NOTE — Telephone Encounter (Signed)
Rx(s) sent to pharmacy electronically.  

## 2021-06-17 ENCOUNTER — Ambulatory Visit (INDEPENDENT_AMBULATORY_CARE_PROVIDER_SITE_OTHER): Payer: Medicare Other | Admitting: Neurology

## 2021-06-17 ENCOUNTER — Encounter: Payer: Self-pay | Admitting: Neurology

## 2021-06-17 VITALS — BP 140/80 | HR 64 | Ht 69.0 in | Wt 169.0 lb

## 2021-06-17 DIAGNOSIS — G3184 Mild cognitive impairment, so stated: Secondary | ICD-10-CM | POA: Diagnosis not present

## 2021-06-17 DIAGNOSIS — R454 Irritability and anger: Secondary | ICD-10-CM | POA: Diagnosis not present

## 2021-06-17 NOTE — Patient Instructions (Signed)
I had along discussion with patient and her husband regarding her intermittent episodes of explosive anger and altered behavior which appear to be of unclear etiology.  Cognitive testing does not show significant underlying depression or dementia they appear to be not frequent enough to justify specific medications but would pursue further work-up by checking EEG for seizure activity and MRI scan of the brain for structural lesions.  Check memory panel labs, CMP and CBC.  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  Continue warfarin for her atrial fibrillation and return for follow-up to see me in 3 months or call earlier if necessary. ?

## 2021-06-17 NOTE — Progress Notes (Signed)
? ? ?PATIENT: [name] [name] ?DOB: October 14, 1940 ?Referring Physician : Jos? [name] ?REASON FOR VISIT: New episodes of abnormal behavior  ?HISTORY FROM: patient and husband and review of chart ? ?HISTORY OF PRESENT ILLNESS: [name] [name] is a 81 year old pleasant Caucasian lady seen today for office consultation visit.  She is accompanied by her husband.  History is obtained from both of them and review of electronic medical records.  I personally reviewed pertinent available imaging films in PACS.: Patient is seen for follow-up today after her last visit 5 and half years ago.  She is accompanied by her husband.  She is here mainly to see me because of new spells she has been having for the last year or so.  She described these coming on suddenly without warning.  She becomes very irritable and angry and often starts speaking in loud tone and voice and husband feels she is trying to yell at him.  This lasts for variable period of time from a few hours to several hours.  She after this episode she seems apologetic for her behavior.  She is fully awake and aware during the episodes.  She denies any altered consciousness, staring, tonic-clonic activity or involuntary movements.  These may often be triggered by argument.  Has not had any progressive memory decline.  She denies any seizure-like activity.  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  She was last seen in my office in 2017 at that time she was on seizure medications but they apparently have been discontinued and she states she has had not not had any breakthrough seizures.  The current episodes are not similar to the previous seizures that she had had.  Patient still has trouble remembering mostly telephone numbers but she can pay her own bills and handle her finances and is independent in activities of daily living. ? ?Update 07/14/13 (LL):  Since last visit, has  struggled with hip pain since surgery, denies falls, ambulates with a cane.  No recurrent seizures, states taking Dilantin 100 ER QID but level last month was only 3.5, she takes 2 in the morning, 1 in the afternoon and 1 at bedtime.  Right hand tremor is stable. MMSE is stable at 26/30. ? ?Update 09/27/12 (LL): Patient comes in for 6 month follow up for seizures. Doing well, had fractured right hip recently from fall in yard, status post surgery 07/15/12. Released from rehab to home 3 weeks ago. Ambulating with cane, doing well. No further seizures since initial episode. She is on Dilantin 150 mg 3 times a day and seems to the tolerating it well without any dizziness, imbalance, diplopia or dysarthria. Thinks that memory is stable, only notable trouble is remembering numbers. Still has mild tremor of the right hand, reports it is better with Ativan. No new complaints.  ? ?Update January 21st 2014 (PS): She returns for followup after last visit on 12/11/2011. She continues to do well without any recurrent seizures following the one during her initial hospitalization in March 2013. She is on Dilantin 150 mg 3 times a day and seems to the tolerating it well without any dizziness, imbalance, diplopia or dysarthria. She continues to have mild short-term memory difficulties which are mainly related to her new memory. She can remember remote incidents quite well. She also complains of mild action tremor of the right hand but it has not been disabling.  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 She also has mild diabetic peripheral neuropathy. Overall doing well. One syncopal episode related to hypoglycemia.  ?Update 11/14/13 : She returns for followup of her last visit 4 months ago. She continues to do well without recurrent seizures since the initial one in  March 2013. She is currently on Vantin 400 mg daily and seems to be covering it very well. She does have mild tremor of the left hand but denies dizziness, diplopia or vertigo sleepiness. She continues to have mild short-term memory difficulties but she seems to be coping quite well with them and these are not progressive. In fact she is still balancing checkbooks and writing checks and not making any mistakes. She has no new complaints today at ?Update 05/17/2014 : She returns for follow-up after last visit 6 months ago. Patient continues to do well without recurrent seizures. She has tolerated decrease in the Dilantin to 300 mg daily. She has been depressed and upset by her son's activities. He is been arrested for house breaking and is in jail. She has short-term memory difficulties and needs help even with cooking. She has never been tried on medications for depression and she is willing to try treatment. ?Update 11/28/2014 : She returns for follow-up after last visit 6 months ago accompanied by her husband. She has not had any recurrent seizures and continues to tolerate Dilantin 300 mg at night quite well without significant side effects. She continues to have depression despite being on Lexapro 10 mg and this is situational related to his son's activities is currently in rehabilitation and may have to go to jail. She is tolerating Lexapro well without any side effects. She does have upcoming visit with her primary physician next month. She has a lot of itching in her hands and is scratching her forearms. and this may be stress related ?Update 05/30/2015 ;  ?Patient continues to well from seizure standpoint has not had a seizure now for a couple of years. She remains on Dilantin 300 mg a day and is tolerating it well without side effects However she was hospitalized a few times last month. She was admitted with MI, systolic heart failure with ischemic cardiomyopathy and had a cardiac cath on 05/17/15 and underwent  angioplasty. She had another admission for UTI with enterococcus and abdominal pain CT abdomen showed emphysematous cystitis and she had a urology and ID consult. He was treated with antibiotics and has been gradual improvement. She continues to walk with a wheeled walker for long distances. She has had no fortunately falls. She has no other complaints today. ?Update 12/24/2015 : She returns for follow-up after last visit 6 months ago. She is accompanied by husband. Patient has the unfortunate fall off 3-4 weeks ago and had hip fracture. She underwent surgery by Dr. Lorin Mercy. She is getting therapy and is currently in a significant rehabilitation facility. Patient had recent lab work done on 12/04/15 with phenytoin level being 3.4 total and free fentanyl been nondetectable. Patient states she's been compliant with her medications. She did have a questionable seizure back in July but in the setting of a bladder infection and level at that time on 7/15/17was 2.8 in Dilantin dose was increased from 300 to 400 mg daily. And follow-up level on 09/18/15 was 9.1. Patient is currently getting physical therapy and learning to walk following her hip surgery. She states  she is cognitively stable without significant new memory or cognitive problems. She has no new complaints. ?REVIEW OF SYSTEMS: Full 14 system review of systems performed and notable only for:  ? , Episodes of anger and abnormal behavior   walking difficulty, ,   only and all other systems negative ? ? ?ALLERGIES: ?Allergies  ?Allergen Reactions  ? Procaine Hcl Anaphylaxis  ? Amoxicillin Itching  ?   ?Did it involve swelling of the face/tongue/throat, SOB, or low BP? No ?Did it involve sudden or severe rash/hives, skin peeling, or any reaction on the inside of your mouth or nose? No ?Did you need to seek medical attention at a hospital or doctor's office? No ?When did it last happen? unk ?If all above answers are "NO", may proceed with cephalosporin use. ?  ? ? ?HOME  MEDICATIONS: ?Outpatient Medications Prior to Visit  ?Medication Sig Dispense Refill  ? alendronate (FOSAMAX) 35 MG tablet TAKE 1 TABLET BY MOUTH ONCE A WEEK IN THE MORNING WITH A FULL GLASS OF WATER, ON AN

## 2021-06-18 ENCOUNTER — Ambulatory Visit (INDEPENDENT_AMBULATORY_CARE_PROVIDER_SITE_OTHER): Payer: Medicare Other | Admitting: Endocrinology

## 2021-06-18 ENCOUNTER — Encounter: Payer: Self-pay | Admitting: Endocrinology

## 2021-06-18 VITALS — BP 110/62 | HR 60 | Ht 69.0 in | Wt 168.8 lb

## 2021-06-18 DIAGNOSIS — Z794 Long term (current) use of insulin: Secondary | ICD-10-CM

## 2021-06-18 DIAGNOSIS — E1122 Type 2 diabetes mellitus with diabetic chronic kidney disease: Secondary | ICD-10-CM | POA: Diagnosis not present

## 2021-06-18 DIAGNOSIS — E1159 Type 2 diabetes mellitus with other circulatory complications: Secondary | ICD-10-CM | POA: Diagnosis not present

## 2021-06-18 DIAGNOSIS — N1832 Chronic kidney disease, stage 3b: Secondary | ICD-10-CM

## 2021-06-18 LAB — COMPREHENSIVE METABOLIC PANEL
ALT: 11 IU/L (ref 0–32)
AST: 19 IU/L (ref 0–40)
Albumin/Globulin Ratio: 1.2 (ref 1.2–2.2)
Albumin: 3.5 g/dL — ABNORMAL LOW (ref 3.7–4.7)
Alkaline Phosphatase: 87 IU/L (ref 44–121)
BUN/Creatinine Ratio: 15 (ref 12–28)
BUN: 20 mg/dL (ref 8–27)
Bilirubin Total: 0.3 mg/dL (ref 0.0–1.2)
CO2: 23 mmol/L (ref 20–29)
Calcium: 9.2 mg/dL (ref 8.7–10.3)
Chloride: 104 mmol/L (ref 96–106)
Creatinine, Ser: 1.31 mg/dL — ABNORMAL HIGH (ref 0.57–1.00)
Globulin, Total: 3 g/dL (ref 1.5–4.5)
Glucose: 205 mg/dL — ABNORMAL HIGH (ref 70–99)
Potassium: 5.2 mmol/L (ref 3.5–5.2)
Sodium: 142 mmol/L (ref 134–144)
Total Protein: 6.5 g/dL (ref 6.0–8.5)
eGFR: 41 mL/min/{1.73_m2} — ABNORMAL LOW (ref >59)

## 2021-06-18 LAB — DEMENTIA PANEL
Homocysteine: 13.1 umol/L (ref 0.0–19.2)
RPR Ser Ql: NONREACTIVE
TSH: 1.38 u[IU]/mL (ref 0.450–4.500)
Vitamin B-12: 1011 pg/mL (ref 232–1245)

## 2021-06-18 LAB — CBC
Hematocrit: 36.5 % (ref 34.0–46.6)
Hemoglobin: 12.2 g/dL (ref 11.1–15.9)
MCH: 31 pg (ref 26.6–33.0)
MCHC: 33.4 g/dL (ref 31.5–35.7)
MCV: 93 fL (ref 79–97)
Platelets: 397 10*3/uL (ref 150–450)
RBC: 3.94 x10E6/uL (ref 3.77–5.28)
RDW: 12.2 % (ref 11.7–15.4)
WBC: 7.5 10*3/uL (ref 3.4–10.8)

## 2021-06-18 LAB — POCT GLYCOSYLATED HEMOGLOBIN (HGB A1C): Hemoglobin A1C: 7.3 % — AB (ref 4.0–5.6)

## 2021-06-18 NOTE — Progress Notes (Signed)
? ?Subjective:  ? ? Patient ID: [name] [name], female    DOB: [birth year], 81 y.o.   MRN: [id number] ? ?HPI ?Pt returns for f/u of diabetes mellitus:   ?DM type: Insulin-requiring type 2.  ?Dx'ed: 1992 ?Complications: PN, CAD, stage 3b CRI, and DR.  ?Therapy: insulin since soon after dx.  ?GDM: never.  ?DKA: never.  ?Severe hypoglycemia: last episode was in mid-2018.  ?Pancreatitis: never.  ?SDOH: She takes human insulin, due to cost.  ?Other: she is on BID premixed insulin, due to noncompliance with multiple daily injections.   ?Interval history: no cbg record, but states cbg's vary from 89-148.  There is no trend throughout the day. pt states she feels well in general.  Pt says she never misses the insulin.   ?Past Medical History:  ?Diagnosis Date  ? Atrial fibrillation ([place])   ? SVT dx 2007, cath 2007 mild CAD, then had an cardioversion, ablation; still on coumadin , has occ palpitation, EKG 03-2010 NSR  ? Blindness of left eye   ? CKD (chronic kidney disease), stage III ([place]) 06/16/2016  ? [name] 06/17/2016  ? DIABETES MELLITUS, TYPE II 03/27/2006  ? dr [name]  ? Eye muscle weakness   ? Right eye weakness after cataract surgery  ? GERD (gastroesophageal reflux disease) 10/05/2011  ? 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 HYPERLIPIDEMIA 03/27/2006  ? HYPERTENSION 03/27/2006  ? Intertrochanteric fracture of right hip ([place]) 07/13/2012  ? LUNG NODULE 09/01/2006  ? Excision, Bx Benighn  ? Memory deficit ~ 2013  ? "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"  ? Osteopenia 2004  ? Dexa 2004 showed Osteopenia, DEXA 03/2007 normal  ? Osteopenia   ? Other chronic cystitis with hematuria   ? Pelvic fracture ([place]) 06/16/2016  ? S/P fall  ? Recurrent urinary tract infection   ? Seeing Urology  ? 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 Seizures ([place])   ? 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[place]) 4/62/7035  ? ? ?Past Surgical History:  ?Procedure Laterality Date  ? ANKLE FRACTURE SURGERY Bilateral 08/2015  ? "right one was in 2 places"  ? CARDIAC CATHETERIZATION N/A 05/17/2015  ?  Procedure: Left Heart Cath and Coronary Angiography;  Surgeon: Jettie Booze, MD;  Location: [facility];  Service: Cardiovascular;  Laterality: N/A;  ? CARDIAC CATHETERIZATION N/A 05/17/2015  ? Procedure: Coronary Balloon Angioplasty;  Surgeon: Jettie Booze, MD;  Location: [facility];  Service: Cardiovascular;  Laterality: N/A;  ? FEMUR IM NAIL Right 07/15/2012  ? Procedure: INTRAMEDULLARY (IM) NAIL HIP;  Surgeon: Johnny Bridge, MD;  Location: [facility];  Service: Orthopedics;  Laterality: Right;  ? FEMUR IM NAIL Left 12/02/2015  ? Procedure: INTRAMEDULLARY (IM) NAIL FEMORAL;  Surgeon: [name], MD;  Location: [facility];  Service: Orthopedics;  Laterality: Left;  ? FRACTURE SURGERY    ? INCISION AND DRAINAGE HIP Left 01/03/2016  ? Procedure: IRRIGATION AND DEBRIDEMENT HIP;  Surgeon: [name], MD;  Location: [facility];  Service: Orthopedics;  Laterality: Left;  ? SHOULDER SURGERY Bilateral   ? "don't remember which side"  ? ? ?Social History  ? ?Socioeconomic History  ? Marital status: Married  ?  Spouse name: Jenny Reichmann  ? Number of children: 1  ? Years of education: College  ? Highest education level: Not on file  ?Occupational History  ? Occupation: retired   ?  Employer: RETIRED  ?Tobacco Use  ? Smoking status: Former  ?  Types: Cigarettes  ?  Quit  date: 03/02/1978  ?  Years since quitting: 43.3  ? Smokeless tobacco: Never  ?Vaping Use  ? Vaping Use: Never used  ?Substance and Sexual Activity  ? Alcohol use: No  ?  Alcohol/week: 0.0 standard drinks  ? Drug use: No  ? Sexual activity: Not on file  ?Other Topics Concern  ? Not on file  ?Social History Narrative  ? Patient lives at home spouse. Uses a walker consistently.  ? Moved from [place] 2004  ? 1 son, problems w/ drugs, passed away 02/20/2016-- OD  ?    ? ?Social Determinants of Health  ? ?Financial Resource Strain: Low Risk   ? Difficulty of Paying Living Expenses: Not hard at all  ?Food Insecurity: No Food Insecurity  ? Worried About Sales executive in the Last Year: Never true  ? Ran Out of Food in the Last Year: Never true  ?Transportation Needs: No Transportation Needs  ? Lack of Transportation (Medical): No  ? Lack of Transportation (Non-Medical): No  ?Physical Activity: Inactive  ? Days of Exercise per Week: 0 days  ? Minutes of Exercise per Session: 0 min  ?Stress: No Stress Concern Present  ? Feeling of Stress : Not at all  ?Social Connections: Moderately Integrated  ? Frequency of Communication with Friends and Family: More than three times a week  ? Frequency of Social Gatherings with Friends and Family: More than three times a week  ? Attends Religious Services: More than 4 times per year  ? Active Member of Clubs or Organizations: No  ? Attends Archivist Meetings: Never  ? Marital Status: Married  ?Intimate Partner Violence: Not At Risk  ? Fear of Current or Ex-Partner: No  ? Emotionally Abused: No  ? Physically Abused: No  ? Sexually Abused: No  ? ? ?Current Outpatient Medications on File Prior to Visit  ?Medication Sig Dispense Refill  ? alendronate (FOSAMAX) 35 MG tablet TAKE 1 TABLET BY MOUTH ONCE A WEEK IN THE MORNING WITH A FULL GLASS OF WATER, ON AN EMPTY STOMACH 12 tablet 3  ? atorvastatin (LIPITOR) 80 MG tablet Take 1 tablet by mouth once daily 90 tablet 0  ? ezetimibe (ZETIA) 10 MG tablet Take 1 tablet by mouth once daily 30 tablet 0  ? furosemide (LASIX) 40 MG tablet TAKE 1 TABLET BY MOUTH TWICE DAILY AS NEEDED FOR FLUID 180 tablet 1  ? insulin NPH-regular Human (NOVOLIN 70/30 RELION) (70-30) 100 UNIT/ML injection 26 units with breakfast and 2 units with the evening meal. 20 mL 3  ? metoprolol succinate (TOPROL-XL) 50 MG 24 hr tablet Take 1 tablet (50 mg total) by mouth daily. NEED APPOINTMENT 90 tablet 0  ? Multiple Vitamins-Minerals (MULTIVITAMIN ADULT PO) Take 1 tablet by mouth daily with breakfast.     ? warfarin (COUMADIN) 2.5 MG tablet TAKE 1 TO 2 TABLETS ONCE DAILY AS DIRECTED BY  COUMADIN  CLINIC 60 tablet 0  ? ?No  current facility-administered medications on file prior to visit.  ? ? ?Allergies  ?Allergen Reactions  ? Procaine Hcl Anaphylaxis  ? Amoxicillin Itching  ?   ?Did it involve swelling of the face/tongue/throat, SOB, or low BP? No ?Did it involve sudden or severe rash/hives, skin peeling, or any reaction on the inside of your mouth or nose? No ?Did you need to seek medical attention at a hospital or doctor's office? No ?When did it last happen? unk ?If all above answers are "NO", may proceed with cephalosporin  use. ?  ? ? ?Family History  ?Problem Relation Age of Onset  ? Diabetes Father   ? Heart attack Father 9  ? Diabetes Sister   ? Drug abuse Son   ? Cancer Neg Hx   ?     no hx of colon or breast cancer  ? ? ?BP 110/62 (BP Location: Left Arm, Patient Position: Sitting, Cuff Size: Normal)   Pulse 60   Ht 5\' 9"  (1.753 m)   Wt 168 lb 12.8 oz (76.6 kg)   SpO2 97%   BMI 24.93 kg/m?  ? ?Review of Systems ?She denies hypoglycemia.  ?   ?Objective:  ? Physical Exam ?VITAL SIGNS:  See vs page ?GENERAL: no distress ? ?Lab Results  ?Component Value Date  ? CREATININE 1.31 (H) 06/17/2021  ? BUN 20 06/17/2021  ? NA 142 06/17/2021  ? K 5.2 06/17/2021  ? CL 104 06/17/2021  ? CO2 23 06/17/2021  ? ? ?A1c=7.3% ? ?   ?Assessment & Plan:  ?Insulin-requiring type 2 DM: this is the best control this pt should aim for, given this regimen, which does match insulin to her changing needs throughout the day ? ?Patient Instructions  ?Please continue the same insulin. ?On this type of insulin schedule, you should eat meals on a regular schedule.  If a meal is missed or significantly delayed, your blood sugar could go low.    ?check your blood sugar twice a day.  vary the time of day when you check, between before the 3 meals, and at bedtime.  also check if you have symptoms of your blood sugar being too high or too low.  please keep a record of the readings and bring it to your next appointment here.  please call us sooner if your  blood sugar goes below 70, or if you have a lot of readings over 200. ?You should have an endocrinology follow-up appointment in 4 months ? ? ? ?

## 2021-06-18 NOTE — Patient Instructions (Addendum)
Please continue the same insulin. ?On this type of insulin schedule, you should eat meals on a regular schedule.  If a meal is missed or significantly delayed, your blood sugar could go low.    ?check your blood sugar twice a day.  vary the time of day when you check, between before the 3 meals, and at bedtime.  also check if you have symptoms of your blood sugar being too high or too low.  please keep a record of the readings and bring it to your next appointment here.  please call us sooner if your blood sugar goes below 70, or if you have a lot of readings over 200. ?You should have an endocrinology follow-up appointment in 4 months ? ?

## 2021-06-23 ENCOUNTER — Telehealth: Payer: Self-pay | Admitting: Neurology

## 2021-06-23 NOTE — Telephone Encounter (Signed)
Medicare/BCBS order sent to [facility], [employer] they will reach out to the patient to schedule.  ?

## 2021-06-24 ENCOUNTER — Ambulatory Visit (INDEPENDENT_AMBULATORY_CARE_PROVIDER_SITE_OTHER): Payer: Medicare Other | Admitting: Neurology

## 2021-06-24 DIAGNOSIS — R4182 Altered mental status, unspecified: Secondary | ICD-10-CM | POA: Diagnosis not present

## 2021-06-24 DIAGNOSIS — R454 Irritability and anger: Secondary | ICD-10-CM

## 2021-07-01 LAB — HM DIABETES EYE EXAM

## 2021-07-06 ENCOUNTER — Other Ambulatory Visit: Payer: Self-pay | Admitting: Cardiovascular Disease

## 2021-07-07 ENCOUNTER — Ambulatory Visit (INDEPENDENT_AMBULATORY_CARE_PROVIDER_SITE_OTHER): Payer: Medicare Other | Admitting: Internal Medicine

## 2021-07-07 ENCOUNTER — Telehealth: Payer: Self-pay | Admitting: Neurology

## 2021-07-07 ENCOUNTER — Encounter: Payer: Self-pay | Admitting: Internal Medicine

## 2021-07-07 VITALS — BP 126/64 | HR 60 | Temp 98.0°F | Resp 18 | Ht 69.0 in | Wt 167.2 lb

## 2021-07-07 DIAGNOSIS — E782 Mixed hyperlipidemia: Secondary | ICD-10-CM

## 2021-07-07 DIAGNOSIS — N1832 Chronic kidney disease, stage 3b: Secondary | ICD-10-CM | POA: Diagnosis not present

## 2021-07-07 DIAGNOSIS — E1122 Type 2 diabetes mellitus with diabetic chronic kidney disease: Secondary | ICD-10-CM | POA: Diagnosis not present

## 2021-07-07 DIAGNOSIS — I10 Essential (primary) hypertension: Secondary | ICD-10-CM | POA: Diagnosis not present

## 2021-07-07 DIAGNOSIS — Z794 Long term (current) use of insulin: Secondary | ICD-10-CM | POA: Diagnosis not present

## 2021-07-07 LAB — LIPID PANEL
Cholesterol: 180 mg/dL (ref 0–200)
HDL: 48.4 mg/dL (ref >39.00)
LDL Cholesterol: 96 mg/dL (ref 0–99)
NonHDL: 131.97
Total CHOL/HDL Ratio: 4
Triglycerides: 180 mg/dL — ABNORMAL HIGH (ref 0.0–149.0)
VLDL: 36 mg/dL (ref 0.0–40.0)

## 2021-07-07 NOTE — Telephone Encounter (Signed)
Called the patient and her husband and reviewed the results from the EEG. Advised the normal finding. She verbalized understanding and had no further questions.  ?

## 2021-07-07 NOTE — Assessment & Plan Note (Signed)
DM: Per Endo, check micro. ?MBE:MLJQG well controlled on Lasix and metoprolol, last BMP normal ?Hyperlipidemia: Last LDL elevated, patient reports she takes Lipitor and Zetia regularly.  Check FLP. ?H/o seizures, mild cognitive impairment, abnormal behavior: ?Saw neurology 06/17/2021: They noted she has a history of herpes encephalitis , mild memory loss and cognitive impairment. Also having episodes of abnormal behavior (yelling, anger).  Blood work was satisfactory, EEG few days ago essentially negative, MRI brain pending. ?Headache: See last visit, resolved essentially, CT head negative ?Osteoporosis: Rx alendronate 07/2018, DEXA 2025. ?RTC 4 to 5 months ?

## 2021-07-07 NOTE — Patient Instructions (Addendum)
?  GO TO THE LAB : Get the blood work and a urine sample ? ? ?[place], [place] ?Come back for   a checkup in 4-5 ? ?Please schedule a Medicare Wellness visit at 08/23/2021. ? ? ?Recommend to proceed with Shingrix (shingles) vaccine at your pharmacy. ? ?Per our records you are due for your diabetic eye exam. Please contact your eye doctor to schedule an appointment. Please have them send copies of your office visit notes to [place]. Our fax number is [phone]. If you need a referral to an eye doctor please let us know. ? ?

## 2021-07-07 NOTE — Telephone Encounter (Signed)
Rx(s) sent to pharmacy electronically.  

## 2021-07-07 NOTE — Progress Notes (Signed)
? ?Subjective:  ? ? Patient ID: [name] [name], female    DOB: [birth year], 81 y.o.   MRN: [id number] ? ?DOS:  07/07/2021 ?Type of visit - description: f/u ?Since the last office visit reports she is doing well. ?No major concerns ?Denies further headaches. ?Good compliance with medication including cholesterol medicines. ?No recent ambulatory BPs. ?Saw neurology, note reviewed ? ? ?Review of Systems ?See above  ? ?Past Medical History:  ?Diagnosis Date  ? Atrial fibrillation ([place])   ? SVT dx 2007, cath 2007 mild CAD, then had an cardioversion, ablation; still on coumadin , has occ palpitation, EKG 03-2010 NSR  ? Blindness of left eye   ? CKD (chronic kidney disease), stage III ([place]) 06/16/2016  ? [name] 06/17/2016  ? DIABETES MELLITUS, TYPE II 03/27/2006  ? dr [name]  ? Eye muscle weakness   ? Right eye weakness after cataract surgery  ? GERD (gastroesophageal reflux disease) 10/05/2011  ? 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 HYPERLIPIDEMIA 03/27/2006  ? HYPERTENSION 03/27/2006  ? Intertrochanteric fracture of right hip ([place]) 07/13/2012  ? LUNG NODULE 09/01/2006  ? Excision, Bx Benighn  ? Memory deficit ~ 2013  ? "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"  ? Osteopenia 2004  ? Dexa 2004 showed Osteopenia, DEXA 03/2007 normal  ? Osteopenia   ? Other chronic cystitis with hematuria   ? Pelvic fracture ([place]) 06/16/2016  ? S/P fall  ? Recurrent urinary tract infection   ? Seeing Urology  ? 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 Seizures ([place])   ? 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[place]) 7/82/9562  ? ? ?Past Surgical History:  ?Procedure Laterality Date  ? ANKLE FRACTURE SURGERY Bilateral 08/2015  ? "right one was in 2 places"  ? CARDIAC CATHETERIZATION N/A 05/17/2015  ? Procedure: Left Heart Cath and Coronary Angiography;  Surgeon: Jettie Booze, MD;  Location: [facility];  Service: Cardiovascular;  Laterality: N/A;  ? CARDIAC CATHETERIZATION N/A 05/17/2015  ? Procedure: Coronary Balloon Angioplasty;  Surgeon: Jettie Booze, MD;  Location: [facility];  Service: Cardiovascular;  Laterality: N/A;  ? FEMUR IM NAIL Right 07/15/2012  ? Procedure: INTRAMEDULLARY (IM) NAIL HIP;  Surgeon: Johnny Bridge, MD;  Location: [facility];  Service: Orthopedics;  Laterality: Right;  ? FEMUR IM NAIL Left 12/02/2015  ? Procedure: INTRAMEDULLARY (IM) NAIL FEMORAL;  Surgeon: [name], MD;  Location: [facility];  Service: Orthopedics;  Laterality: Left;  ? FRACTURE SURGERY    ? INCISION AND DRAINAGE HIP Left 01/03/2016  ? Procedure: IRRIGATION AND DEBRIDEMENT HIP;  Surgeon: [name], MD;  Location: [facility];  Service: Orthopedics;  Laterality: Left;  ? SHOULDER SURGERY Bilateral   ? "don't remember which side"  ? ? ?Current Outpatient Medications  ?Medication Instructions  ? alendronate (FOSAMAX) 35 MG tablet TAKE 1 TABLET BY MOUTH ONCE A WEEK IN THE MORNING WITH A FULL GLASS OF WATER, ON AN EMPTY STOMACH  ? atorvastatin (LIPITOR) 80 MG tablet Take 1 tablet by mouth once daily  ? ezetimibe (ZETIA) 10 mg, Oral, Daily, NEED APPOINTMENT  ? furosemide (LASIX) 40 MG tablet TAKE 1 TABLET BY MOUTH TWICE DAILY AS NEEDED FOR FLUID  ? insulin NPH-regular Human (NOVOLIN 70/30 RELION) (70-30) 100 UNIT/ML injection 26 units with breakfast and 2 units with the evening meal.  ? metoprolol succinate (TOPROL-XL) 50 mg, Oral, Daily, NEED APPOINTMENT  ? Multiple Vitamins-Minerals (MULTIVITAMIN ADULT PO) 1 tablet, Oral, Daily with breakfast  ? warfarin (COUMADIN) 2.5 MG tablet  TAKE 1 TO 2 TABLETS ONCE DAILY AS DIRECTED BY  COUMADIN  CLINIC  ? ? ?   ?Objective:  ? Physical Exam ?BP 126/64 (BP Location: Left Arm, Patient Position: Sitting, Cuff Size: Small)   Pulse 60   Temp 98 ?F (36.7 ?C) (Oral)   Resp 18   Ht 5\' 9"  (1.753 m)   Wt 167 lb 4 oz (75.9 kg)   SpO2 97%   BMI 24.70 kg/m?  ?General:   ?Well developed, NAD, BMI noted. ?HEENT:  ?Normocephalic . Face symmetric, atraumatic ?Lungs:  ?CTA B ?Normal respiratory effort, no intercostal retractions, no accessory muscle use. ?Heart: Seems  regular ?Lower extremities: no pretibial edema bilaterally  ?Skin: Not pale. Not jaundice ?Neurologic:  ?alert & oriented X3.  ?Speech normal, gait assisted by a walker ?Psych--  ?Cognition and judgment appear intact.  ?Cooperative with normal attention span and concentration.  ?Behavior appropriate. ?No anxious or depressed appearing.  Seems in good days. ?   ?Assessment   ? ?Assessment   ?DM  + retinopathy, Dr [name] ?HTN ?CKD: Elevated creatinine(started after admission 05-2015, had IV contrast ( Creatinine 1.5 = GFR ~35 ) ?Hyperlipidemia ?anxiety ativan ?GERD ?Anemia, normal iron 05-2015, never had a colonoscopy ?CV ?---NSTEMI 05-2015, cath, angiplasty ?---Echo, EF 40 % (05-2015) ?---Paroxysmal Atrial Fibrillation/ Atrial Flutter s/p Ablation / History of SVT  on coumadin  ?GU Urinary retention /UTI 05-2015, had a foley temporarily, saw urology, PVR 249 cc (high), rx timed voiding ?Neuro: ?-Herpes encephalitis 2014 ?-Seizures (after encephalitis), sz episode 08-2015 (?) ?- HAs-dizzines ?- encephalomalacia per CT  ?Blindness, left eye ?Right eye weakness after cataract surgery ?Osteoporosis:   DEXA 2004, DEXA 2009 normal,  dexa 2015 @ [facility] -1.8.   T score -1.8 in 2018.   ?Hip fracture 2017;L1 vertebral fracture 2020 >>> Rx  alendronate 07/2018 ?H/o lung bx 2008 benign  ? ?PLAN: ?DM: Per Endo, check micro. 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  Check FLP. ?H/o seizures, mild cognitive impairment, abnormal behavior: ?Saw neurology 06/17/2021: They noted she has a history of herpes encephalitis , mild memory loss and cognitive impairment. Also having episodes of abnormal behavior (yelling, anger).  Blood work was satisfactory, EEG few days ago essentially negative, MRI brain pending. ?Headache: See last visit, resolved essentially, CT head negative ?Osteoporosis: Rx alendronate 07/2018, DEXA 2025. ?RTC 4  to 5 months ?  ?

## 2021-07-08 ENCOUNTER — Other Ambulatory Visit: Payer: Medicare Other

## 2021-07-08 LAB — MICROALBUMIN / CREATININE URINE RATIO
Creatinine,U: 68.9 mg/dL
Microalb Creat Ratio: 8.1 mg/g (ref 0.0–30.0)
Microalb, Ur: 5.6 mg/dL — ABNORMAL HIGH (ref 0.0–1.9)

## 2021-07-09 MED ORDER — ATORVASTATIN CALCIUM 80 MG PO TABS: 80.0000 mg | ORAL_TABLET | Freq: Every day | ORAL | 1 refills | Status: DC

## 2021-07-09 NOTE — Addendum Note (Signed)
Addended byDamita Dunnings D on: 07/09/2021 11:06 AM ? ? Modules accepted: Orders ? ?

## 2021-07-16 ENCOUNTER — Ambulatory Visit (INDEPENDENT_AMBULATORY_CARE_PROVIDER_SITE_OTHER): Payer: Medicare Other | Admitting: *Deleted

## 2021-07-16 DIAGNOSIS — Z5181 Encounter for therapeutic drug level monitoring: Secondary | ICD-10-CM

## 2021-07-16 DIAGNOSIS — I482 Chronic atrial fibrillation, unspecified: Secondary | ICD-10-CM

## 2021-07-16 DIAGNOSIS — I480 Paroxysmal atrial fibrillation: Secondary | ICD-10-CM | POA: Diagnosis not present

## 2021-07-16 LAB — POCT INR: INR: 2.3 (ref 2.0–3.0)

## 2021-07-16 NOTE — Patient Instructions (Signed)
Description   ?Continue taking warfarin 1 tablet daily except 1/2 tablet on Mondays. Stay consistent with leafy veggies to your diet. Recheck INR in 6 weeks. [facility] [phone].  ?  ?  ?

## 2021-08-17 ENCOUNTER — Other Ambulatory Visit: Payer: Self-pay | Admitting: Physician Assistant

## 2021-08-17 DIAGNOSIS — I480 Paroxysmal atrial fibrillation: Secondary | ICD-10-CM

## 2021-08-25 ENCOUNTER — Ambulatory Visit (INDEPENDENT_AMBULATORY_CARE_PROVIDER_SITE_OTHER): Payer: Medicare Other

## 2021-08-25 DIAGNOSIS — Z0000 Encounter for general adult medical examination without abnormal findings: Secondary | ICD-10-CM

## 2021-08-25 DIAGNOSIS — Z780 Asymptomatic menopausal state: Secondary | ICD-10-CM | POA: Diagnosis not present

## 2021-08-25 DIAGNOSIS — Z1231 Encounter for screening mammogram for malignant neoplasm of breast: Secondary | ICD-10-CM

## 2021-08-27 ENCOUNTER — Ambulatory Visit (INDEPENDENT_AMBULATORY_CARE_PROVIDER_SITE_OTHER): Payer: Medicare Other | Admitting: *Deleted

## 2021-08-27 DIAGNOSIS — I482 Chronic atrial fibrillation, unspecified: Secondary | ICD-10-CM | POA: Diagnosis not present

## 2021-08-27 DIAGNOSIS — Z5181 Encounter for therapeutic drug level monitoring: Secondary | ICD-10-CM

## 2021-08-27 DIAGNOSIS — I480 Paroxysmal atrial fibrillation: Secondary | ICD-10-CM | POA: Diagnosis not present

## 2021-08-27 LAB — POCT INR: INR: 5.9 — AB (ref 2.0–3.0)

## 2021-08-27 NOTE — Patient Instructions (Addendum)
Description   Do not take any Warfarin tomorrow, No warfarin Friday, and No warfarin Saturday then continue taking warfarin 1 tablet daily except 1/2 tablet on Mondays. Stay consistent with leafy veggies to your diet. Recheck INR in 1 week. [facility] [phone].

## 2021-08-30 ENCOUNTER — Emergency Department (HOSPITAL_COMMUNITY)
Admission: EM | Admit: 2021-08-30 | Discharge: 2021-08-31 | Disposition: A | Discharge status: Home / Self Care | Payer: Medicare Other | Attending: Emergency Medicine | Admitting: Emergency Medicine

## 2021-08-30 DIAGNOSIS — Z7901 Long term (current) use of anticoagulants: Secondary | ICD-10-CM | POA: Insufficient documentation

## 2021-08-30 DIAGNOSIS — R1030 Lower abdominal pain, unspecified: Secondary | ICD-10-CM | POA: Diagnosis not present

## 2021-08-30 DIAGNOSIS — N3000 Acute cystitis without hematuria: Secondary | ICD-10-CM | POA: Insufficient documentation

## 2021-08-30 DIAGNOSIS — R519 Headache, unspecified: Secondary | ICD-10-CM | POA: Diagnosis present

## 2021-08-30 DIAGNOSIS — R7989 Other specified abnormal findings of blood chemistry: Secondary | ICD-10-CM | POA: Insufficient documentation

## 2021-08-30 DIAGNOSIS — D72829 Elevated white blood cell count, unspecified: Secondary | ICD-10-CM | POA: Insufficient documentation

## 2021-08-30 DIAGNOSIS — G4489 Other headache syndrome: Secondary | ICD-10-CM | POA: Insufficient documentation

## 2021-08-30 DIAGNOSIS — Z79899 Other long term (current) drug therapy: Secondary | ICD-10-CM | POA: Insufficient documentation

## 2021-08-30 DIAGNOSIS — I2510 Atherosclerotic heart disease of native coronary artery without angina pectoris: Secondary | ICD-10-CM | POA: Diagnosis not present

## 2021-08-30 DIAGNOSIS — I130 Hypertensive heart and chronic kidney disease with heart failure and stage 1 through stage 4 chronic kidney disease, or unspecified chronic kidney disease: Secondary | ICD-10-CM | POA: Diagnosis not present

## 2021-08-30 DIAGNOSIS — M791 Myalgia, unspecified site: Secondary | ICD-10-CM | POA: Insufficient documentation

## 2021-08-30 DIAGNOSIS — N183 Chronic kidney disease, stage 3 unspecified: Secondary | ICD-10-CM | POA: Diagnosis not present

## 2021-08-30 DIAGNOSIS — N289 Disorder of kidney and ureter, unspecified: Secondary | ICD-10-CM | POA: Insufficient documentation

## 2021-08-30 DIAGNOSIS — I4891 Unspecified atrial fibrillation: Secondary | ICD-10-CM | POA: Diagnosis not present

## 2021-08-30 DIAGNOSIS — E860 Dehydration: Secondary | ICD-10-CM | POA: Diagnosis not present

## 2021-08-30 DIAGNOSIS — R8271 Bacteriuria: Secondary | ICD-10-CM | POA: Diagnosis not present

## 2021-08-30 DIAGNOSIS — E1122 Type 2 diabetes mellitus with diabetic chronic kidney disease: Secondary | ICD-10-CM | POA: Insufficient documentation

## 2021-08-30 DIAGNOSIS — Z794 Long term (current) use of insulin: Secondary | ICD-10-CM | POA: Insufficient documentation

## 2021-08-30 DIAGNOSIS — I5020 Unspecified systolic (congestive) heart failure: Secondary | ICD-10-CM | POA: Insufficient documentation

## 2021-08-31 ENCOUNTER — Other Ambulatory Visit: Payer: Self-pay

## 2021-08-31 ENCOUNTER — Emergency Department (HOSPITAL_COMMUNITY): Payer: Medicare Other

## 2021-08-31 ENCOUNTER — Encounter (HOSPITAL_COMMUNITY): Payer: Self-pay | Admitting: *Deleted

## 2021-08-31 DIAGNOSIS — G4489 Other headache syndrome: Secondary | ICD-10-CM | POA: Diagnosis not present

## 2021-08-31 LAB — CBC
HCT: 35.7 % — ABNORMAL LOW (ref 36.0–46.0)
Hemoglobin: 11.7 g/dL — ABNORMAL LOW (ref 12.0–15.0)
MCH: 30 pg (ref 26.0–34.0)
MCHC: 32.8 g/dL (ref 30.0–36.0)
MCV: 91.5 fL (ref 80.0–100.0)
Platelets: 463 10*3/uL — ABNORMAL HIGH (ref 150–400)
RBC: 3.9 MIL/uL (ref 3.87–5.11)
RDW: 12.3 % (ref 11.5–15.5)
WBC: 22.5 10*3/uL — ABNORMAL HIGH (ref 4.0–10.5)
nRBC: 0 % (ref 0.0–0.2)

## 2021-08-31 LAB — URINALYSIS, ROUTINE W REFLEX MICROSCOPIC
Bilirubin Urine: NEGATIVE
Glucose, UA: >=500 mg/dL — AB
Ketones, ur: NEGATIVE mg/dL
Nitrite: NEGATIVE
Protein, ur: NEGATIVE mg/dL
Specific Gravity, Urine: 1.007 (ref 1.005–1.030)
pH: 6 (ref 5.0–8.0)

## 2021-08-31 LAB — COMPREHENSIVE METABOLIC PANEL
ALT: 22 U/L (ref 0–44)
AST: 25 U/L (ref 15–41)
Albumin: 2.7 g/dL — ABNORMAL LOW (ref 3.5–5.0)
Alkaline Phosphatase: 79 U/L (ref 38–126)
Anion gap: 9 (ref 5–15)
BUN: 20 mg/dL (ref 8–23)
CO2: 27 mmol/L (ref 22–32)
Calcium: 8.3 mg/dL — ABNORMAL LOW (ref 8.9–10.3)
Chloride: 93 mmol/L — ABNORMAL LOW (ref 98–111)
Creatinine, Ser: 1.84 mg/dL — ABNORMAL HIGH (ref 0.44–1.00)
GFR, Estimated: 28 mL/min — ABNORMAL LOW (ref >60)
Glucose, Bld: 316 mg/dL — ABNORMAL HIGH (ref 70–99)
Potassium: 4.1 mmol/L (ref 3.5–5.1)
Sodium: 129 mmol/L — ABNORMAL LOW (ref 135–145)
Total Bilirubin: 1.2 mg/dL (ref 0.3–1.2)
Total Protein: 6.9 g/dL (ref 6.5–8.1)

## 2021-08-31 LAB — LIPASE, BLOOD: Lipase: 20 U/L (ref 11–51)

## 2021-08-31 LAB — PROTIME-INR
INR: 1.8 — ABNORMAL HIGH (ref 0.8–1.2)
Prothrombin Time: 20.7 seconds — ABNORMAL HIGH (ref 11.4–15.2)

## 2021-08-31 MED ORDER — PROCHLORPERAZINE EDISYLATE 10 MG/2ML IJ SOLN
10.0000 mg | Freq: Once | INTRAMUSCULAR | Status: AC
  Administered 2021-08-31: 10 mg via INTRAVENOUS
  Filled 2021-08-31: qty 2

## 2021-08-31 MED ORDER — CEPHALEXIN 500 MG PO CAPS: 500.0000 mg | ORAL_CAPSULE | Freq: Two times a day (BID) | ORAL | 0 refills | Status: DC

## 2021-08-31 MED ORDER — LACTATED RINGERS IV BOLUS
1000.0000 mL | Freq: Once | INTRAVENOUS | Status: AC
  Administered 2021-08-31: 1000 mL via INTRAVENOUS

## 2021-08-31 MED ORDER — SODIUM CHLORIDE 0.9 % IV SOLN
1.0000 g | Freq: Once | INTRAVENOUS | Status: AC
  Administered 2021-08-31: 1 g via INTRAVENOUS
  Filled 2021-08-31: qty 10

## 2021-08-31 NOTE — ED Triage Notes (Signed)
The pt arrived by [facility] from home  she is c/o a headache and  epigastric pai   she has been hurting all over   iv per ems

## 2021-08-31 NOTE — ED Notes (Signed)
Pt able to stand w/o difficulty. Pt states abdomen hurts worse while standing. MD notified.

## 2021-08-31 NOTE — Discharge Instructions (Addendum)
The CAT scan of your brain was normal when compared to prior CAT scan Your blood work revealed that you are dehydrated to have a urinary tract infection Please take the antibiotic that I sent to the [facility] twice a day for a week Please see Dr. Larose Kells in 1 week for a recheck and to reorder blood work on your kidneys

## 2021-08-31 NOTE — ED Provider Notes (Signed)
[facility] EMERGENCY DEPARTMENT Provider Note   CSN: [id number] Arrival date & time: 08/30/21  2359     History  Chief Complaint  Patient presents with   Headache    [name] [name] is a 81 y.o. female.  The history is provided by the patient and the spouse.  Headache Onset quality:  Gradual Timing:  Constant Progression:  Worsening Chronicity:  Recurrent Similar to prior headaches: yes   Relieved by:  Nothing Associated symptoms: abdominal pain and myalgias   Associated symptoms: no fever and no vomiting   Patient with history of atrial fibrillation on Coumadin, memory deficit, hypertension, diabetes, previous history of herpes encephalitis presents with headache.  Patient reports she was in her usual state of health until approximately 12 hours ago she began having headache and pressure on her head.  No fevers or vomiting.  No new chest pain.  No new weakness.  No new changes in her vision (chronic blindness reported) She reports she has had this headache previously.  No seizures.  No recent falls or trauma She reports after the headache started, she started having lower abdominal pain and myalgias.  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   HYPERLIPIDEMIA 03/27/2006   HYPERTENSION 03/27/2006   Intertrochanteric fracture of right hip ([place]) 07/13/2012   LUNG NODULE 09/01/2006   Excision, Bx Benighn   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"   Osteopenia 2004   Dexa  2004 showed Osteopenia, DEXA 03/2007 normal   Osteopenia    Other chronic cystitis with hematuria    Pelvic fracture ([place]) 06/16/2016   S/P fall   Recurrent urinary tract infection    Seeing Urology   RETINOPATHY, BACKGROUND NOS 03/27/2006   Seizures ([place])    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 Authorizing Provider  cephALEXin (KEFLEX) 500 MG capsule Take 1 capsule (500 mg total) by mouth 2 (two) times daily. 08/31/21  Yes Ripley Fraise, MD  alendronate (FOSAMAX) 35 MG tablet TAKE 1 TABLET BY MOUTH ONCE A WEEK IN THE MORNING WITH A FULL GLASS OF WATER, ON AN EMPTY STOMACH 02/25/21   Colon Branch, MD  atorvastatin (LIPITOR) 80 MG tablet Take 1 tablet (80 mg total) by mouth daily. 07/09/21   Colon Branch, MD  ezetimibe (ZETIA) 10 MG tablet Take 1 tablet (10 mg total) by mouth daily. NEED APPOINTMENT 07/07/21   Skeet Latch, MD  furosemide (LASIX) 40 MG tablet TAKE 1 TABLET BY MOUTH TWICE DAILY AS NEEDED FOR FLUID 10/22/20   Colon Branch, MD  insulin NPH-regular Human (NOVOLIN 70/30 RELION) (70-30) 100 UNIT/ML injection 26 units with breakfast and 2 units with the evening meal. 03/14/21   Renato Shin, MD  metoprolol succinate (TOPROL-XL) 50 MG 24 hr tablet Take 1 tablet (50 mg total) by mouth daily. NEED  APPOINTMENT 06/16/21   Skeet Latch, MD  Multiple Vitamins-Minerals (MULTIVITAMIN ADULT PO) Take 1 tablet by mouth daily with breakfast.     [provider]  warfarin (COUMADIN) 2.5 MG tablet TAKE 1 TO 2 TABLETS BY MOUTH ONCE DAILY AS DIRECTED BY  COUMADIN  CLINIC 08/18/21   Warren Lacy, PA-C      Allergies    Procaine hcl and Amoxicillin    Review of Systems   Review of Systems  Constitutional:  Negative for fever.  Respiratory:  Negative for shortness of breath.   Cardiovascular:  Negative for chest pain.  Gastrointestinal:  Positive for abdominal pain. Negative for vomiting.   Genitourinary:  Negative for flank pain.  Musculoskeletal:  Positive for myalgias.  Neurological:  Positive for headaches.    Physical Exam Updated Vital Signs BP (!) 157/55   Pulse 63   Temp (!) 97.2 F (36.2 C) (Oral)   Resp 17   Ht 1.753 m (5\' 9" )   Wt 75.9 kg   SpO2 94%   BMI 24.71 kg/m  Physical Exam CONSTITUTIONAL: Elderly, no acute distress HEAD: Normocephalic/atraumatic EYES: Patient blind at baseline ENMT: Mucous membranes moist NECK: supple no meningeal signs SPINE/BACK:entire spine nontender CV: S1/S2 noted, no murmurs/rubs/gallops noted LUNGS: Lungs are clear to auscultation bilaterally, no apparent distress ABDOMEN: soft, mild suprapubic tenderness, no rebound or guarding GU:no cva tenderness NEURO:Awake/alert, face symmetric, no arm or leg drift is noted Patient answers questions appropriate.  No focal weakness noted EXTREMITIES: pulses normal, full ROM SKIN: warm, color normal PSYCH: no abnormalities of mood noted, alert and oriented to situation  ED Results / Procedures / Treatments   Labs (all labs ordered are listed, but only abnormal results are displayed) Labs Reviewed  COMPREHENSIVE METABOLIC PANEL - Abnormal; Notable for the following components:      Result Value   Sodium 129 (*)    Chloride 93 (*)    Glucose, Bld 316 (*)    Creatinine, Ser 1.84 (*)    Calcium 8.3 (*)    Albumin 2.7 (*)    GFR, Estimated 28 (*)    All other components within normal limits  CBC - Abnormal; Notable for the following components:   WBC 22.5 (*)    Hemoglobin 11.7 (*)    HCT 35.7 (*)    Platelets 463 (*)    All other components within normal limits  URINALYSIS, ROUTINE W REFLEX MICROSCOPIC - Abnormal; Notable for the following components:   Color, Urine YELLOW (*)    APPearance CLOUDY (*)    Glucose, UA >=500 (*)    Hgb urine dipstick MODERATE (*)    Leukocytes,Ua LARGE (*)    Bacteria, UA MANY (*)    All other components within normal limits   PROTIME-INR - Abnormal; Notable for the following components:   Prothrombin Time 20.7 (*)    INR 1.8 (*)    All other components within normal limits  LIPASE, BLOOD    EKG EKG Interpretation  Date/Time:  Sunday August 31 2021 00:08:44 EDT Ventricular Rate:  70 PR Interval:  220 QRS Duration: 90 QT Interval:  414 QTC Calculation: 447 R Axis:   -21 Text Interpretation: Sinus rhythm with 1st degree A-V block Anteroseptal infarct , age undetermined ST & T wave abnormality, consider inferior ischemia Abnormal ECG No significant change since last tracing Confirmed by Ripley Fraise [phone]) on 08/31/2021 2:52:29 AM  Radiology CT Head Wo Contrast  Result Date: 08/31/2021 CLINICAL DATA:  Worsening headaches EXAM:  CT HEAD WITHOUT CONTRAST TECHNIQUE: Contiguous axial images were obtained from the base of the skull through the vertex without intravenous contrast. RADIATION DOSE REDUCTION: This exam was performed according to the departmental dose-optimization program which includes automated exposure control, adjustment of the mA and/or kV according to patient size and/or use of iterative reconstruction technique. COMPARISON:  04/08/2021 FINDINGS: Brain: There again noted changes consistent with prior left MCA infarct involving primarily the left temporal lobe. Chronic atrophic changes are noted. Mild chronic ischemic changes are seen as well. Dystrophic calcifications are noted in the area of encephalomalacia. Vascular: No hyperdense vessel or unexpected calcification. Skull: Normal. Negative for fracture or focal lesion. Sinuses/Orbits: No acute finding. Other: None. 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 No acute abnormality is noted. Electronically Signed   By: Inez Catalina M.D.   On: 08/31/2021 03:41    Procedures Procedures    Medications Ordered in ED Medications  prochlorperazine (COMPAZINE) injection 10 mg (10 mg Intravenous Given 08/31/21 0352)  cefTRIAXone  (ROCEPHIN) 1 g in sodium chloride 0.9 % 100 mL IVPB (0 g Intravenous Stopped 08/31/21 0432)  lactated ringers bolus 1,000 mL (0 mLs Intravenous Stopped 08/31/21 6144)    ED Course/ Medical Decision Making/ A&P Clinical Course as of 08/31/21 0622  Sun Aug 31, 2021  0252 Bacteria, UA(!): MANY UTI noted [DW]  0252 Creatinine(!): 1.84 Renal insufficiency and dehydration noted [DW]  0252 WBC(!): 22.5 Leukocytosis noted [DW]  0321 Patient reports onset of headache earlier in the day.  Reports she has had this previously.  No new weakness, patient has visual deficits at baseline.  She is in no acute distress, but due to history of memory loss, as well as anticoagulation, will obtain CT head.  She is currently afebrile with an oral temperature of 98 and no meningeal signs, low suspicion for meningoencephalitis. [DW]  [id number] Patient also noted to have UTI with suprapubic tenderness.  She is not septic appearing.  We will start IV Rocephin [DW]  0501 Patient improving.  IV fluids are infusing. [DW]  3154 Patient is improved.  Labs reveal hyperglycemia but no anion gap.  Slightly worse renal insufficiency. [DW]  0086 Patient's headache is improved, she is resting comfortably.  Patient reports she has is headache frequently, low suspicion for acute neurologic etiology [DW]  0621 Patient is safe for outpatient management of UTI.  She is not septic appearing.  She has been referred to her PCP for f/u [DW]    Clinical Course User Index [DW] Ripley Fraise, MD                           Medical Decision Making Amount and/or Complexity of Data Reviewed Labs: ordered. Decision-making details documented in ED Course. Radiology: ordered.  Risk Prescription drug management.   This patient presents to the ED for concern of headache, this involves an extensive number of treatment options, and is a complaint that carries with it a high risk of complications and morbidity.  The differential diagnosis includes but  is not limited to intracranial hemorrhage, brain tumor, meningitis, subarachnoid hemorrhage, traumatic brain injury  Comorbidities that complicate the patient evaluation: Patient's presentation is complicated by their history of atrial fibrillation on Coumadin, previous history of encephalitis  Social Determinants of Health: Patient's  history of memory loss   increases the complexity of managing their presentation  Additional history obtained: Additional history obtained from spouse Records reviewed Primary Care Documents  Lab Tests: I Ordered, and personally interpreted labs.  The pertinent results include: UTI noted, renal sufficiency, dehydration  Imaging Studies ordered: I ordered imaging studies including CT scan head   I independently visualized and interpreted imaging which showed no acute findings I agree with the radiologist interpretation  Cardiac Monitoring: The patient was maintained on a cardiac monitor.  I personally viewed and interpreted the cardiac monitor which showed an underlying rhythm of:  sinus rhythm  Medicines ordered and prescription drug management: I ordered medication including Compazine for headache Reevaluation of the patient after these medicines showed that the patient    improved   Critical Interventions:  IV antibiotics   Reevaluation: After the interventions noted above, I reevaluated the patient and found that they have :improved  Complexity of problems addressed: Patient's presentation is most consistent with  acute presentation with potential threat to life or bodily function  Disposition: After consideration of the diagnostic results and the patient's response to treatment,  I feel that the patent would benefit from discharge   .           Final Clinical Impression(s) / ED Diagnoses Final diagnoses:  Acute cystitis without hematuria  Other headache syndrome  Dehydration  Renal insufficiency    Rx / DC Orders ED  Discharge Orders          Ordered    cephALEXin (KEFLEX) 500 MG capsule  2 times daily        08/31/21 0109              Ripley Fraise, MD 08/31/21 [phone]

## 2021-09-04 ENCOUNTER — Ambulatory Visit (INDEPENDENT_AMBULATORY_CARE_PROVIDER_SITE_OTHER): Payer: Medicare Other

## 2021-09-04 DIAGNOSIS — I482 Chronic atrial fibrillation, unspecified: Secondary | ICD-10-CM

## 2021-09-04 DIAGNOSIS — I480 Paroxysmal atrial fibrillation: Secondary | ICD-10-CM | POA: Diagnosis not present

## 2021-09-04 DIAGNOSIS — Z5181 Encounter for therapeutic drug level monitoring: Secondary | ICD-10-CM | POA: Diagnosis not present

## 2021-09-04 LAB — POCT INR: INR: 1.8 — AB (ref 2.0–3.0)

## 2021-09-04 NOTE — Patient Instructions (Signed)
TAKE 1.5 TABLETS TODAY ONLY and then continue taking warfarin 1 tablet daily except 1/2 tablet on Mondays. Stay consistent with leafy veggies to your diet. Recheck INR in 2 weeks. Coumadin Clinic [phone].

## 2021-09-08 ENCOUNTER — Ambulatory Visit (INDEPENDENT_AMBULATORY_CARE_PROVIDER_SITE_OTHER): Payer: Medicare Other | Admitting: Internal Medicine

## 2021-09-08 ENCOUNTER — Ambulatory Visit (HOSPITAL_BASED_OUTPATIENT_CLINIC_OR_DEPARTMENT_OTHER)
Admission: RE | Admit: 2021-09-08 | Discharge: 2021-09-08 | Disposition: A | Discharge status: Home / Self Care | Payer: Medicare Other | Source: Ambulatory Visit | Attending: Internal Medicine | Admitting: Internal Medicine

## 2021-09-08 ENCOUNTER — Encounter (HOSPITAL_BASED_OUTPATIENT_CLINIC_OR_DEPARTMENT_OTHER): Payer: Self-pay

## 2021-09-08 ENCOUNTER — Encounter: Payer: Self-pay | Admitting: Internal Medicine

## 2021-09-08 ENCOUNTER — Other Ambulatory Visit: Payer: Self-pay | Admitting: Internal Medicine

## 2021-09-08 VITALS — BP 132/80 | HR 101 | Temp 98.0°F | Resp 18 | Ht 69.0 in | Wt 163.0 lb

## 2021-09-08 DIAGNOSIS — Z780 Asymptomatic menopausal state: Secondary | ICD-10-CM

## 2021-09-08 DIAGNOSIS — Z1231 Encounter for screening mammogram for malignant neoplasm of breast: Secondary | ICD-10-CM

## 2021-09-08 DIAGNOSIS — N390 Urinary tract infection, site not specified: Secondary | ICD-10-CM

## 2021-09-08 DIAGNOSIS — I10 Essential (primary) hypertension: Secondary | ICD-10-CM | POA: Diagnosis not present

## 2021-09-08 DIAGNOSIS — R42 Dizziness and giddiness: Secondary | ICD-10-CM

## 2021-09-08 LAB — BASIC METABOLIC PANEL
BUN: 28 mg/dL — ABNORMAL HIGH (ref 6–23)
CO2: 32 mEq/L (ref 19–32)
Calcium: 9.3 mg/dL (ref 8.4–10.5)
Chloride: 95 mEq/L — ABNORMAL LOW (ref 96–112)
Creatinine, Ser: 1.74 mg/dL — ABNORMAL HIGH (ref 0.40–1.20)
GFR: 27.52 mL/min — ABNORMAL LOW (ref >60.00)
Glucose, Bld: 190 mg/dL — ABNORMAL HIGH (ref 70–99)
Potassium: 4.9 mEq/L (ref 3.5–5.1)
Sodium: 136 mEq/L (ref 135–145)

## 2021-09-08 LAB — CBC WITH DIFFERENTIAL/PLATELET
Basophils Absolute: 0.1 10*3/uL (ref 0.0–0.1)
Basophils Relative: 0.9 % (ref 0.0–3.0)
Eosinophils Absolute: 0.2 10*3/uL (ref 0.0–0.7)
Eosinophils Relative: 2 % (ref 0.0–5.0)
HCT: 35.7 % — ABNORMAL LOW (ref 36.0–46.0)
Hemoglobin: 11.6 g/dL — ABNORMAL LOW (ref 12.0–15.0)
Lymphocytes Relative: 26.5 % (ref 12.0–46.0)
Lymphs Abs: 2.2 10*3/uL (ref 0.7–4.0)
MCHC: 32.6 g/dL (ref 30.0–36.0)
MCV: 91.8 fl (ref 78.0–100.0)
Monocytes Absolute: 0.9 10*3/uL (ref 0.1–1.0)
Monocytes Relative: 10.9 % (ref 3.0–12.0)
Neutro Abs: 4.9 10*3/uL (ref 1.4–7.7)
Neutrophils Relative %: 59.7 % (ref 43.0–77.0)
Platelets: 553 10*3/uL — ABNORMAL HIGH (ref 150.0–400.0)
RBC: 3.89 Mil/uL (ref 3.87–5.11)
RDW: 13 % (ref 11.5–15.5)
WBC: 8.2 10*3/uL (ref 4.0–10.5)

## 2021-09-08 NOTE — Progress Notes (Unsigned)
Subjective:    Patient ID: [name] [name], female    DOB: Aug 24, 1940, 81 y.o.   MRN: [id number]  DOS:  09/08/2021 Type of visit - description: ER follow-up  Went to the ER 08/30/2021: At the time she was having headache, dizziness and lower abdominal pain. No fever chills No nausea or vomiting No dysuria, gross hematuria or difficulty urinating.  Next  Work-up: Sodium 129, blood sugar 316, creatinine 1.8, higher than I will average. White count 22, hemoglobin 11.7, platelets slightly elevated at 463. Lipase normal Urine: + Leukocytes and bacteria CT head no acute Treated with IVF, Rocephin. Was discharged home, on cephalexin by mouth. At this point she is feeling back to normal with no fever chills headache or abdominal pain.    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   HYPERLIPIDEMIA 03/27/2006   HYPERTENSION 03/27/2006   Intertrochanteric fracture of right hip ([place]) 07/13/2012   LUNG NODULE 09/01/2006   Excision, Bx Benighn   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"   Osteopenia 2004   Dexa 2004 showed Osteopenia, DEXA 03/2007 normal   Osteopenia    Other chronic cystitis with hematuria    Pelvic fracture ([place]) 06/16/2016   S/P fall   Recurrent urinary tract infection    Seeing Urology   RETINOPATHY, BACKGROUND NOS 03/27/2006   Seizures ([place])    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[place]) 3/00/9233    Past Surgical History:  Procedure Laterality Date   ANKLE FRACTURE SURGERY Bilateral 08/2015    "right one was in 2 places"   CARDIAC CATHETERIZATION N/A 05/17/2015   Procedure: Left Heart Cath and Coronary Angiography;  Surgeon: Jettie Booze, MD;  Location: [place] [facility];  Service: Cardiovascular;  Laterality: N/A;   CARDIAC CATHETERIZATION N/A 05/17/2015   Procedure: Coronary Balloon Angioplasty;  Surgeon: Jettie Booze, MD;  Location: [place] [facility];  Service: Cardiovascular;  Laterality: N/A;   FEMUR IM NAIL Right 07/15/2012   Procedure: INTRAMEDULLARY (IM) NAIL HIP;  Surgeon: Johnny Bridge, MD;  Location: [place];  Service: Orthopedics;  Laterality: Right;   FEMUR IM NAIL Left 12/02/2015   Procedure: INTRAMEDULLARY (IM) NAIL FEMORAL;  Surgeon: [name], MD;  Location: [facility];  Service: Orthopedics;  Laterality: Left;   FRACTURE SURGERY     INCISION AND DRAINAGE HIP Left 01/03/2016   Procedure: IRRIGATION AND DEBRIDEMENT HIP;  Surgeon: [name], MD;  Location: [facility];  Service: Orthopedics;  Laterality: Left;   SHOULDER SURGERY Bilateral    "don't remember which side"    Current Outpatient Medications  Medication Instructions   alendronate (FOSAMAX) 35 MG tablet TAKE 1 TABLET BY MOUTH ONCE A WEEK IN THE MORNING WITH A FULL GLASS OF WATER, ON AN EMPTY STOMACH   atorvastatin (LIPITOR) 80 mg, Oral, Daily   cephALEXin (KEFLEX) 500 mg, Oral, 2 times daily   ezetimibe (ZETIA) 10 mg, Oral, Daily, NEED APPOINTMENT  furosemide (LASIX) 40 MG tablet TAKE 1 TABLET BY MOUTH TWICE DAILY AS NEEDED FOR FLUID   insulin NPH-regular Human (NOVOLIN 70/30 RELION) (70-30) 100 UNIT/ML injection 26 units with breakfast and 2 units with the evening meal.   metoprolol succinate (TOPROL-XL) 50 mg, Oral, Daily, NEED APPOINTMENT   Multiple Vitamins-Minerals (MULTIVITAMIN ADULT PO) 1 tablet, Oral, Daily with breakfast   VITAMIN D, CHOLECALCIFEROL, PO Oral   warfarin (COUMADIN) 2.5 MG tablet TAKE 1 TO 2 TABLETS BY MOUTH ONCE DAILY AS DIRECTED BY  COUMADIN  CLINIC       Objective:    Physical Exam BP 132/80   Pulse (!) 101   Temp 98 F (36.7 C) (Oral)   Resp 18   Ht 5\' 9"  (1.753 m)   Wt 163 lb (73.9 kg)   SpO2 95%   BMI 24.07 kg/m  General:   Well developed, NAD, BMI noted.  HEENT:  Normocephalic . Face symmetric, atraumatic Lungs:  CTA B Normal respiratory effort, no intercostal retractions, no accessory muscle use. Heart: Seems regular Abdomen:  Not distended, soft, non-tender. No rebound or rigidity.   Skin: Not pale. Not jaundice Lower extremities: no pretibial edema bilaterally  Neurologic:  alert & oriented X3.  Speech normal, gait assisted by walker  psych--  Cognition and judgment appear intact.  Cooperative with normal attention span and concentration.  Behavior appropriate. No anxious or depressed appearing.     Assessment    Assessment   DM  + retinopathy, Dr [name] HTN CKD: Elevated creatinine(started after admission 05-2015, had IV contrast ( Creatinine 1.5 = GFR ~35 ) Hyperlipidemia anxiety ativan GERD Anemia, normal iron 05-2015, never had a colonoscopy CV ---NSTEMI 05-2015, cath, angiplasty ---Echo, EF 40 % (05-2015) ---Paroxysmal Atrial Fibrillation/ Atrial Flutter s/p Ablation / History of SVT  on coumadin  GU Urinary retention /UTI 05-2015, had a foley temporarily, saw urology, PVR 249 cc (high), rx timed voiding Neuro: -Herpes encephalitis 2014 -Seizures (after encephalitis), sz episode 08-2015 (?) - HAs-dizzines - encephalomalacia per CT  Blindness, left eye Right eye weakness after cataract surgery Osteoporosis:   DEXA 2004, DEXA 2009 normal,  dexa 2015 @ [facility] -1.8.   T score -1.8 in 2018.   Hip fracture 2017;L1 vertebral fracture 2020 >>> Rx  alendronate 07/2018 H/o lung bx 2008 benign   PLAN: Headache, dizziness, UTI, dehydration: Went to the ER with above problems, work-up showed elevated white count and increased creatinine. Was treated with IV fluids, Rocephin and just finished cephalexin by mouth.  No UCX was  done. She is now "back to normal". Reports she is drinking plenty of fluids.  Recheck labs. HTN: Well-controlled, check a CBC and BMP DM: CBG was very high at the ER, this morning at home CBG  132. RTC scheduled for October

## 2021-09-08 NOTE — Patient Instructions (Addendum)
Drink plenty of fluids  Recommend to proceed with the following vaccines at your pharmacy:  Shingrix (shingles) Covid booster (bivalent)  Check the  blood pressure regularly BP GOAL is between 110/65 and  135/85. If it is consistently higher or lower, let me know    GO TO THE LAB : Get the blood work    See you in October   Per our records you are due for your diabetic eye exam. Please contact your eye doctor to schedule an appointment. Please have them send copies of your office visit notes to [place]. Our fax number is [phone]. If you need a referral to an eye doctor please let us know.

## 2021-09-09 NOTE — Assessment & Plan Note (Signed)
PLAN: Headache, dizziness, UTI, dehydration: Went to the ER with above problems, work-up showed elevated white count and increased creatinine. Was treated with IV fluids, Rocephin and just finished cephalexin by mouth.  No UCX was done. She is now "back to normal". Reports she is drinking plenty of fluids.  Recheck labs. HTN: Well-controlled, check a CBC and BMP DM: CBG was very high at the ER, this morning at home CBG  132. RTC scheduled for October

## 2021-09-10 ENCOUNTER — Telehealth: Payer: Self-pay

## 2021-09-10 NOTE — Telephone Encounter (Signed)
[name] Rama- can you being process for Prolia please?

## 2021-09-12 NOTE — Telephone Encounter (Signed)
Prolia VOB initiated via [email or web address] ? ?New start ? ?

## 2021-09-15 NOTE — Telephone Encounter (Signed)
Pt ready for scheduling on or after 09/15/21  Out-of-pocket cost due at time of visit: $0  Primary: Medicare Prolia co-insurance: 20% (approximately $276) Admin fee co-insurance: 20% (approximately $25)  Secondary: [place] - Medicare Supp Prolia co-insurance: Covers Medicare Part B co-insurance Admin fee co-insurance: Covers Medicare Part B co-insurance  Deductible: $226 of $226 met  Prior Auth: not required PA# Valid:   ** This summary of benefits is an estimation of the patient's out-of-pocket cost. Exact cost may vary based on individual plan coverage.

## 2021-09-16 NOTE — Telephone Encounter (Signed)
My Chart message sent

## 2021-09-16 NOTE — Telephone Encounter (Signed)
Left vm to return call.    

## 2021-09-18 ENCOUNTER — Ambulatory Visit (INDEPENDENT_AMBULATORY_CARE_PROVIDER_SITE_OTHER): Payer: Medicare Other | Admitting: *Deleted

## 2021-09-18 DIAGNOSIS — I482 Chronic atrial fibrillation, unspecified: Secondary | ICD-10-CM

## 2021-09-18 DIAGNOSIS — Z5181 Encounter for therapeutic drug level monitoring: Secondary | ICD-10-CM

## 2021-09-18 DIAGNOSIS — I480 Paroxysmal atrial fibrillation: Secondary | ICD-10-CM

## 2021-09-18 LAB — POCT INR: INR: 5.5 — AB (ref 2.0–3.0)

## 2021-09-18 NOTE — Patient Instructions (Signed)
Description   Do not take any warfarin today and No warfarin tomorrow and 1/2 tablet on Saturdays then start taking warfarin 1 tablet daily except 1/2 tablet on Mondays and Fridays. Stay consistent with leafy veggies to your diet. Recheck INR in 11 days. Coumadin Clinic [phone].

## 2021-09-19 ENCOUNTER — Ambulatory Visit (INDEPENDENT_AMBULATORY_CARE_PROVIDER_SITE_OTHER): Payer: Medicare Other

## 2021-09-19 DIAGNOSIS — M8000XD Age-related osteoporosis with current pathological fracture, unspecified site, subsequent encounter for fracture with routine healing: Secondary | ICD-10-CM | POA: Diagnosis not present

## 2021-09-19 MED ORDER — DENOSUMAB 60 MG/ML ~~LOC~~ SOSY
60.0000 mg | PREFILLED_SYRINGE | Freq: Once | SUBCUTANEOUS | Status: AC
  Administered 2021-09-19: 60 mg via SUBCUTANEOUS

## 2021-09-19 NOTE — Progress Notes (Signed)
[name] [name] is a 81 y.o. female presents to the office today for Prolia injections, per physician's orders. Original order:  Prolia  60mg (med), Left Arm  was administered today. Patient tolerated injection.  Stanislav Gervase M Shaquanna Lycan

## 2021-09-30 ENCOUNTER — Ambulatory Visit (INDEPENDENT_AMBULATORY_CARE_PROVIDER_SITE_OTHER): Payer: Medicare Other

## 2021-09-30 DIAGNOSIS — Z5181 Encounter for therapeutic drug level monitoring: Secondary | ICD-10-CM | POA: Diagnosis not present

## 2021-09-30 DIAGNOSIS — I482 Chronic atrial fibrillation, unspecified: Secondary | ICD-10-CM | POA: Diagnosis not present

## 2021-09-30 DIAGNOSIS — I480 Paroxysmal atrial fibrillation: Secondary | ICD-10-CM | POA: Diagnosis not present

## 2021-09-30 LAB — POCT INR: INR: 4.7 — AB (ref 2.0–3.0)

## 2021-09-30 NOTE — Patient Instructions (Signed)
HOLD TODAY AND WEDNESDAY, then start taking warfarin 0.5 tablet daily except 1 tablet on Monday,Wednesday and Friday. Stay consistent with leafy veggies to your diet. Recheck INR in 2 weeks. Coumadin Clinic [phone].

## 2021-10-01 ENCOUNTER — Other Ambulatory Visit: Payer: Self-pay | Admitting: Internal Medicine

## 2021-10-01 ENCOUNTER — Other Ambulatory Visit: Payer: Self-pay | Admitting: Cardiovascular Disease

## 2021-10-01 ENCOUNTER — Other Ambulatory Visit: Payer: Self-pay | Admitting: Physician Assistant

## 2021-10-01 DIAGNOSIS — I480 Paroxysmal atrial fibrillation: Secondary | ICD-10-CM

## 2021-10-01 NOTE — Telephone Encounter (Signed)
Rx request sent to pharmacy.  

## 2021-10-01 NOTE — Telephone Encounter (Signed)
Please call pt to schedule appt with Dr. Oval Linsey. Will send in 90 day refill of meds with 0 refills. Pt has not seen Dr. Oval Linsey in a long time, just APPs and Coumadin Clinic.

## 2021-10-09 ENCOUNTER — Encounter: Payer: Self-pay | Admitting: Neurology

## 2021-10-09 ENCOUNTER — Ambulatory Visit (INDEPENDENT_AMBULATORY_CARE_PROVIDER_SITE_OTHER): Payer: Medicare Other | Admitting: Neurology

## 2021-10-09 VITALS — BP 173/76 | HR 60 | Ht 69.0 in | Wt 165.0 lb

## 2021-10-09 DIAGNOSIS — G3184 Mild cognitive impairment, so stated: Secondary | ICD-10-CM | POA: Diagnosis not present

## 2021-10-09 DIAGNOSIS — Z8619 Personal history of other infectious and parasitic diseases: Secondary | ICD-10-CM | POA: Diagnosis not present

## 2021-10-09 MED ORDER — PREVAGEN 10 MG PO CAPS: 1.0000 | ORAL_CAPSULE | ORAL | 1 refills | Status: AC

## 2021-10-09 NOTE — Patient Instructions (Signed)
I had a long discussion with the patient and her husband regarding her memory loss and mild cognitive impairment which appears to be stable at the present time. We also discussed the results of recent brain imaging and lab work for reversible causes of memory loss which were all negative.  I recommend she start taking Prevagen 10 mg daily to improve her memory and participation in cognitively challenging like solving crossword puzzles, playing bridge sudoku.  We also discussed memory issues.  She will return for follow-up in the future in 6 months or call earlier If necessary.  Memory Compensation Strategies  Use "WARM" strategy.  W= write it down  A= associate it  R= repeat it  M= make a mental note  2.   You can keep a Social worker.  Use a 3-ring notebook with sections for the following: calendar, important names and phone numbers,  medications, doctors' names/phone numbers, lists/reminders, and a section to journal what you did  each day.   3.    Use a calendar to write appointments down.  4.    Write yourself a schedule for the day.  This can be placed on the calendar or in a separate section of the Memory Notebook.  Keeping a  regular schedule can help memory.  5.    Use medication organizer with sections for each day or morning/evening pills.  You may need help loading it  6.    Keep a basket, or pegboard by the door.  Place items that you need to take out with you in the basket or on the pegboard.  You may also want to  include a message board for reminders.  7.    Use sticky notes.  Place sticky notes with reminders in a place where the task is performed.  For example: " turn off the  stove" placed by the stove, "lock the door" placed on the door at eye level, " take your medications" on  the bathroom mirror or by the place where you normally take your medications.  8.    Use alarms/timers.  Use while cooking to remind yourself to check on food or as a reminder to take your  medicine, or as a  reminder to make a call, or as a reminder to perform another task, etc.

## 2021-10-09 NOTE — Progress Notes (Signed)
PATIENT: [name] [name] DOB: 1941-01-29 Referring Physician : [employer] VISIT: New episodes of abnormal behavior  HISTORY FROM: patient and husband and review of chart  HISTORY OF PRESENT ILLNESS: [name] [name] is a 81 year old pleasant Caucasian lady seen today for office consultation visit.  She is accompanied by her husband.  History is obtained from both of them and review of electronic medical records.  I personally reviewed pertinent available imaging films in PACS.: Patient is seen for follow-up today after her last visit 5 and half years ago.  She is accompanied by her husband.  She is here mainly to see me because of new spells she has been having for the last year or so.  She described these coming on suddenly without warning.  She becomes very irritable and angry and often starts speaking in loud tone and voice and husband feels she is trying to yell at him.  This lasts for variable period of time from a few hours to several hours.  She after this episode she seems apologetic for her behavior.  She is fully awake and aware during the episodes.  She denies any altered consciousness, staring, tonic-clonic activity or involuntary movements.  These may often be triggered by argument.  Has not had any progressive memory decline.  She denies any seizure-like activity.  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  She was last seen in my office in 2017 at that time she was on seizure medications but they apparently have been discontinued and she states she has had not not had any breakthrough seizures.  The current episodes are not similar to the previous seizures that she had had.  Patient still has trouble remembering mostly telephone numbers but she can pay her own bills and handle her finances and is independent in activities of daily living.  Update 07/14/13 (LL):  Since last visit, has  struggled with hip pain since surgery, denies falls, ambulates with a cane.  No recurrent seizures, states taking Dilantin 100 ER QID but level last month was only 3.5, she takes 2 in the morning, 1 in the afternoon and 1 at bedtime.  Right hand tremor is stable. MMSE is stable at 26/30.  Update 09/27/12 (LL): Patient comes in for 6 month follow up for seizures. Doing well, had fractured right hip recently from fall in yard, status post surgery 07/15/12. Released from rehab to home 3 weeks ago. Ambulating with cane, doing well. No further seizures since initial episode. She is on Dilantin 150 mg 3 times a day and seems to the tolerating it well without any dizziness, imbalance, diplopia or dysarthria. Thinks that memory is stable, only notable trouble is remembering numbers. Still has mild tremor of the right hand, reports it is better with Ativan. No new complaints.   Update January 21st 2014 (PS): She returns for followup after last visit on 12/11/2011. She continues to do well without any recurrent seizures following the one during her initial hospitalization in March 2013. She is on Dilantin 150 mg 3 times a day and seems to the tolerating it well without any dizziness, imbalance, diplopia or dysarthria. She continues to have mild short-term memory difficulties which are mainly related to her new memory. She can remember remote incidents quite well. She also complains of mild action tremor of the right hand but it has not been disabling.  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 She also has mild diabetic peripheral neuropathy. Overall doing well. One syncopal episode related to hypoglycemia.  Update 11/14/13 : She returns for followup of her last visit 4 months ago. She continues to do well without recurrent seizures since the initial one in  March 2013. She is currently on Vantin 400 mg daily and seems to be covering it very well. She does have mild tremor of the left hand but denies dizziness, diplopia or vertigo sleepiness. She continues to have mild short-term memory difficulties but she seems to be coping quite well with them and these are not progressive. In fact she is still balancing checkbooks and writing checks and not making any mistakes. She has no new complaints today at Update 05/17/2014 : She returns for follow-up after last visit 6 months ago. Patient continues to do well without recurrent seizures. She has tolerated decrease in the Dilantin to 300 mg daily. She has been depressed and upset by her son's activities. He is been arrested for house breaking and is in jail. She has short-term memory difficulties and needs help even with cooking. She has never been tried on medications for depression and she is willing to try treatment. Update 11/28/2014 : She returns for follow-up after last visit 6 months ago accompanied by her husband. She has not had any recurrent seizures and continues to tolerate Dilantin 300 mg at night quite well without significant side effects. She continues to have depression despite being on Lexapro 10 mg and this is situational related to his son's activities is currently in rehabilitation and may have to go to jail. She is tolerating Lexapro well without any side effects. She does have upcoming visit with her primary physician next month. She has a lot of itching in her hands and is scratching her forearms. and this may be stress related Update 05/30/2015 ;  Patient continues to well from seizure standpoint has not had a seizure now for a couple of years. She remains on Dilantin 300 mg a day and is tolerating it well without side effects However she was hospitalized a few times last month. She was admitted with MI, systolic heart failure with ischemic cardiomyopathy and had a cardiac cath on 05/17/15 and underwent  angioplasty. She had another admission for UTI with enterococcus and abdominal pain CT abdomen showed emphysematous cystitis and she had a urology and ID consult. He was treated with antibiotics and has been gradual improvement. She continues to walk with a wheeled walker for long distances. She has had no fortunately falls. She has no other complaints today. Update 12/24/2015 : She returns for follow-up after last visit 6 months ago. She is accompanied by husband. Patient has the unfortunate fall off 3-4 weeks ago and had hip fracture. She underwent surgery by Dr. Lorin Mercy. She is getting therapy and is currently in a significant rehabilitation facility. Patient had recent lab work done on 12/04/15 with phenytoin level being 3.4 total and free fentanyl been nondetectable. Patient states she's been compliant with her medications. She did have a questionable seizure back in July but in the setting of a bladder infection and level at that time on 7/15/17was 2.8 in Dilantin dose was increased from 300 to 400 mg daily.    Update 10/09/2021 : She returns for follow-up after last visit 3 and half months ago.  She is accompanied by  her husband.  She states her memory difficulties and cognitive impairment appears more or less unchanged.  She has good days and bad days.  She cannot remember things that may come back later.  I have advised an MRI scan of the brain at last visit but for unclear reason it has not been done.  Lab work on 06/17/2021 showed normal vitamin B12, TSH, RPR and levels.  EEG on 07/01/21 was normal.  He had a CT scan of the head on 08/31/2021 which showed encephalomalacia and left remains.  Continues to ambulate with a wheeled walker and has not had any falls or injuries.  She remains on warfarin which she is tolerating well without bruising or bleeding.  Blood pressure is usually under good control but today it is elevated at 173/76.  He is tolerating Lipitor well without muscle aches and pains.  Unclear  reason. REVIEW OF SYSTEMS: Full 14 system review of systems performed and notable only for:   , Episodes of anger and abnormal behavior   walking difficulty, ,   only and all other systems negative   ALLERGIES: Allergies  Allergen Reactions   Procaine Hcl Anaphylaxis   Amoxicillin Itching     Did it involve swelling of the face/tongue/throat, SOB, or low BP? No Did it involve sudden or severe rash/hives, skin peeling, or any reaction on the inside of your mouth or nose? No Did you need to seek medical attention at a hospital or doctor's office? No When did it last happen? unk If all above answers are "NO", may proceed with cephalosporin use.     HOME MEDICATIONS: Outpatient Medications Prior to Visit  Medication Sig Dispense Refill   alendronate (FOSAMAX) 35 MG tablet TAKE 1 TABLET BY MOUTH ONCE A WEEK IN THE MORNING WITH A FULL GLASS OF WATER, ON AN EMPTY STOMACH 12 tablet 3   atorvastatin (LIPITOR) 80 MG tablet Take 1 tablet by mouth once daily 90 tablet 1   ezetimibe (ZETIA) 10 MG tablet TAKE 1 TABLET BY MOUTH ONCE DAILY.  NEED  APPOINTMENT 90 tablet 0   furosemide (LASIX) 40 MG tablet TAKE 1 TABLET BY MOUTH TWICE DAILY AS NEEDED FOR FLUID 180 tablet 1   insulin NPH-regular Human (NOVOLIN 70/30 RELION) (70-30) 100 UNIT/ML injection 26 units with breakfast and 2 units with the evening meal. 20 mL 3   metoprolol succinate (TOPROL-XL) 50 MG 24 hr tablet TAKE 1 TABLET BY MOUTH ONCE DAILY . APPOINTMENT REQUIRED FOR FUTURE REFILLS 90 tablet 0   Multiple Vitamins-Minerals (MULTIVITAMIN ADULT PO) Take 1 tablet by mouth daily with breakfast.      VITAMIN D, CHOLECALCIFEROL, PO Take by mouth.     warfarin (COUMADIN) 2.5 MG tablet TAKE 1/2 TO 1 TABLET BY MOUTH ONCE DAILY AS DIRECTED BY  COUMADIN  [facility] 70 tablet 1   No facility-administered medications prior to visit.     PHYSICAL EXAM  Vitals:   10/09/21 1257  BP: (!) 173/76  Pulse: 60  Weight: 165 lb (74.8 kg)  Height: 5\' 9"   (1.753 m)   Body mass index is 24.37 kg/m. No results found.   Generalized: pleasant elderly Caucasian female in no acute distress,    Neck: Supple, no carotid bruits  Cardiac: Regular rate rhythm, no murmur  Pulmonary: Clear to auscultation bilaterally  Musculoskeletal: No deformity  SKIN : no rash Neurological examination  Mentation: Alert oriented to time, place, history taking, language fluent, and casual conversation.  Recall 3/3.  Able to name  only 9 animals which can walk on 4 legs.  clock drawing 4/4.  Difficulty in copying intersecting pentagons.  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 hearing was intact to finger rubbing bilaterally. Uvula tongue midline. head turning and shoulder shrug and were normal and symmetric.Tongue protrusion into cheek strength was normal.  MOTOR: normal bulk and tone, full strength in the BUE, decreased strength 4/5 hip flexion RLE (due to hip surgery) , LLE 5/5 strength. fine finger movements normal, no pronator drift. Fine action and resting tremor of right hand. No cogwheel rigidity or bradykinesia.  SENSORY: normal and symmetric to light touch Vibration and position impaired over toes.  COORDINATION: finger-nose-finger, heel-to-shin bilaterally, there was no truncal ataxia  REFLEXES: Brachioradialis 2/2, biceps 2/2, triceps 2/2, patellar 1/1, Achilles trace/trace  GAIT/STATION: Uses a wheeled walker and favors the left hip.  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 She also has mild diabetic peripheral neuropathy.  She continues to do well with stable  mild cognitive impairment. PLAN :II had a long discussion with the patient and her husband regarding her memory loss and mild cognitive impairment which appears to be stable at the present time. We also discussed the results of recent brain imaging and lab work for reversible causes of memory loss which were all negative.  I recommend she start taking Prevagen 10 mg daily to improve her memory and participation in cognitively challenging like solving crossword puzzles, playing bridge sudoku.  We also discussed memory issues.  She will return for follow-up in the future in 6 months or call earlier If necessary..  Continue warfarin for her atrial fibrillation  . Greater than 50% time during this 35 minute visit was spent on counseling and coordination of care about her episodes of abnormal behavior,, prevention and treatment She will return for follow-up in 6 months with apractitioner or call earlier if necessary.   Meds ordered this encounter  Medications   Apoaequorin (PREVAGEN) 10 MG CAPS    Sig: Take 1 capsule by mouth 1 day or 1 dose for 1 dose.    Dispense:  1 capsule    Refill:  1   Return in about 6 months (around 04/11/2022).  Antony Contras, MD  10/09/2021, 1:21 PM [facility] [address] [place], [place] [address] [phone]  Note: This document was prepared with digital dictation and possible smart phrase technology. Any transcriptional errors that result from this process are unintentional.

## 2021-10-17 ENCOUNTER — Ambulatory Visit (INDEPENDENT_AMBULATORY_CARE_PROVIDER_SITE_OTHER): Payer: Medicare Other | Admitting: *Deleted

## 2021-10-17 DIAGNOSIS — I480 Paroxysmal atrial fibrillation: Secondary | ICD-10-CM

## 2021-10-17 DIAGNOSIS — I482 Chronic atrial fibrillation, unspecified: Secondary | ICD-10-CM | POA: Diagnosis not present

## 2021-10-17 DIAGNOSIS — Z5181 Encounter for therapeutic drug level monitoring: Secondary | ICD-10-CM | POA: Diagnosis not present

## 2021-10-17 LAB — POCT INR: INR: 2.5 (ref 2.0–3.0)

## 2021-10-17 NOTE — Patient Instructions (Signed)
Description   Continue taking warfarin 1/2 a tablet daily except 1 tablet on Monday,Wednesday and Friday. Stay consistent with leafy veggies to your diet. Recheck INR in 3 weeks. [facility] [phone].

## 2021-10-20 ENCOUNTER — Telehealth: Payer: Self-pay | Admitting: *Deleted

## 2021-10-20 NOTE — Patient Outreach (Signed)
  Care Coordination   10/20/2021 Name: RENISE GILLIES MRN: [id number] DOB: 26-May-1940   Care Coordination Outreach Attempts:  Contact was made with the patient today to offer care coordination services as a benefit of their health plan. Patient declined services. Follow Up Plan:  No further outreach attempts will be made at this time.  Encounter Outcome:  Pt. Refused  Care Coordination Interventions Activated:  No   Care Coordination Interventions:  No, not indicated    Emelia Loron RN, BSN [place] [phone] [email or web address]

## 2021-10-23 NOTE — Telephone Encounter (Signed)
Last Prolia inj 09/19/21 Next Prolia inj due 03/23/22  

## 2021-10-28 ENCOUNTER — Encounter: Payer: Self-pay | Admitting: Internal Medicine

## 2021-10-28 NOTE — Progress Notes (Unsigned)
Cardiology Office Note   Date:  10/28/2021   ID:  Lainy, Wrobleski 10/26/1940, MRN [id number]  PCP:  Colon Branch, MD  Cardiologist:   Skeet Latch, MD   No chief complaint on file.    History of Present Illness: [name] [name] is a 81 y.o. 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  At her last appointment she was noted to be bradycardic into the 40s. Metoprolol was reduced to 50 mg twice a day.  Her lisinopril was increased to 5 mg daily.    [name] [name] saw Dr. Quentin Ore 10/2020 and reported two episodes of chest pain.  It was unclear whether this was related to atrial fibrillation.  She wore a 3 day Zio that showed 26 episodes of SVT up to 13 beats.  Nuclear stress revealed LVEF 45-50% with grade 1 diastolic dysfunction and mid-apical inferolateral hypokinesis.  Nuclear stress showed prior infarct but no ischemia.  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   HYPERLIPIDEMIA 03/27/2006   HYPERTENSION 03/27/2006   Intertrochanteric fracture of right hip ([place]) 07/13/2012   LUNG NODULE 09/01/2006   Excision, Bx [facility]   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"   Osteopenia 2004   Dexa 2004 showed Osteopenia, DEXA 03/2007 normal   Osteopenia    Other chronic cystitis with hematuria    Pelvic fracture ([place]) 06/16/2016   S/P fall    Recurrent urinary tract infection    Seeing Urology   RETINOPATHY, BACKGROUND NOS 03/27/2006   Seizures ([place])    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[place]) 05/23/4008    Past Surgical History:  Procedure Laterality Date   ANKLE FRACTURE SURGERY Bilateral 08/2015   "right one was in 2 places"   CARDIAC CATHETERIZATION N/A 05/17/2015   Procedure: Left Heart Cath and Coronary Angiography;  Surgeon: Jettie Booze, MD;  Location: [facility];  Service: Cardiovascular;  Laterality: N/A;   CARDIAC CATHETERIZATION N/A 05/17/2015   Procedure: Coronary Balloon Angioplasty;  Surgeon: Jettie Booze, MD;  Location: [facility];  Service: Cardiovascular;  Laterality: N/A;   FEMUR IM NAIL Right 07/15/2012   Procedure: INTRAMEDULLARY (IM) NAIL HIP;  Surgeon: Johnny Bridge, MD;  Location: [facility];  Service: Orthopedics;  Laterality: Right;   FEMUR IM NAIL Left 12/02/2015   Procedure: INTRAMEDULLARY (IM) NAIL FEMORAL;  Surgeon: [name], MD;  Location: [facility];  Service: Orthopedics;  Laterality: Left;   FRACTURE SURGERY     INCISION AND DRAINAGE HIP Left 01/03/2016   Procedure: IRRIGATION AND DEBRIDEMENT HIP;  Surgeon: [name], MD;  Location: [facility];  Service: Orthopedics;  Laterality: Left;   SHOULDER SURGERY Bilateral    "don't remember which side"     Current Outpatient Medications  Medication Sig Dispense Refill   alendronate (FOSAMAX) 35 MG  tablet TAKE 1 TABLET BY MOUTH ONCE A WEEK IN THE MORNING WITH A FULL GLASS OF WATER, ON AN EMPTY STOMACH 12 tablet 3   atorvastatin (LIPITOR) 80 MG tablet Take 1 tablet by mouth once daily 90 tablet 1   ezetimibe (ZETIA) 10 MG tablet TAKE 1 TABLET BY MOUTH ONCE DAILY.  NEED  APPOINTMENT 90 tablet 0   furosemide (LASIX) 40 MG tablet TAKE 1 TABLET BY MOUTH TWICE DAILY AS NEEDED FOR FLUID 180 tablet 1   insulin NPH-regular Human (NOVOLIN 70/30 RELION) (70-30) 100 UNIT/ML injection 26 units with breakfast and 2 units with the evening meal. 20 mL 3   metoprolol  succinate (TOPROL-XL) 50 MG 24 hr tablet TAKE 1 TABLET BY MOUTH ONCE DAILY . APPOINTMENT REQUIRED FOR FUTURE REFILLS 90 tablet 0   Multiple Vitamins-Minerals (MULTIVITAMIN ADULT PO) Take 1 tablet by mouth daily with breakfast.      VITAMIN D, CHOLECALCIFEROL, PO Take by mouth.     warfarin (COUMADIN) 2.5 MG tablet TAKE 1/2 TO 1 TABLET BY MOUTH ONCE DAILY AS DIRECTED BY  COUMADIN  CLINIC 70 tablet 1   No current facility-administered medications for this visit.    Allergies:   Procaine hcl and Amoxicillin    Social History:  The patient  reports that she quit smoking about 43 years ago. Her smoking use included cigarettes. She has never used smokeless tobacco. She reports that she does not drink alcohol and does not use drugs.   Family History:  The patient's  family history includes Diabetes in her father and sister; Drug abuse in her son; Heart attack (age of onset: 59) in her father.    ROS:  Please see the history of present illness.   Otherwise, review of systems are positive for none.   All other systems are reviewed and negative.    PHYSICAL EXAM: VS:  There were no vitals taken for this visit. , BMI There is no height or weight on file to calculate BMI. GENERAL:  Well appearing HEENT:  Pupils equal round and reactive, fundi not visualized, oral mucosa unremarkable NECK:  No jugular venous distention, waveform within normal limits, carotid upstroke brisk and symmetric, no bruits LYMPHATICS:  No cervical adenopathy LUNGS:  Clear to auscultation bilaterally HEART:  RRR.  PMI not displaced or sustained,S1 and S2 within normal limits, no S3, no S4, no clicks, no rubs, no murmurs ABD:  Flat, positive bowel sounds normal in frequency in pitch, no bruits, no rebound, no guarding, no midline pulsatile mass, no hepatomegaly, no splenomegaly EXT:  2 plus pulses throughout, no edema, no cyanosis no clubbing SKIN:  No rashes no nodules NEURO:  Cranial nerves II through XII grossly intact,  motor grossly intact throughout PSYCH:  Cognitively intact, oriented to person place and time  EKG:  EKG is ordered today. The ekg ordered today demonstrates sinus Rhythm. Rate 74 bpm. Lateral T-wave inversions. Unchanged from prior, no bradycardia has resolved  Echo 05/10/15: Study Conclusions   - Left ventricle: The cavity size was normal. There was moderate   concentric hypertrophy. Systolic function was moderately reduced.   The estimated ejection fraction was in the range of 35% to 40%.   Diffuse hypokinesis. The study was not technically sufficient to   allow evaluation of LV diastolic dysfunction due to atrial   fibrillation. - Aortic valve: Trileaflet; normal thickness leaflets. There was no   regurgitation. - Aortic root: The aortic root was normal in size. - Mitral valve: Mildly thickened  leaflets . There was mild   regurgitation. - Left atrium: The atrium was moderately dilated. - Right ventricle: The cavity size was normal. Wall thickness was   normal. Systolic function was normal. - Right atrium: The atrium was normal in size. - Tricuspid valve: There was moderate regurgitation. - Pulmonic valve: There was no regurgitation. - Pulmonary arteries: Systolic pressure was mildly to moderately   increased. PA peak pressure: 45 mm Hg (S). - Inferior vena cava: The vessel was normal in size. - Pericardium, extracardiac: There was no pericardial effusion.   LHC 05/17/15: There is moderate left ventricular systolic dysfunction. 1st Mrg lesion, 100% stenosed. Intervention was performed with balloon angioplasty. No stent was placed. At the end of the procedure, the initial occlusion site had no residual stenosis. There did remain a large branch downstream of the initial occlusion which remained occluded and did not respond to angioplasty. There is a smaller branch which had TIMI-3 flow restored. The patient was pain-free at the end of the procedure. Moderate LV dysfunction.  Moderately elevated LVEDP.  3 day Zio 11/2020: HR 45 - 148bpm, average 56bpm. 1 NSVT lasting 4 beats. 26 SVT, longest lasting 13 beats. Rare supraventricular ectopy and ventricular ectopy. No sustained arrhythmias.   Lexiscan Myoview 11/19/20:   Findings are consistent with prior myocardial infarction. The study is low risk.   No ST deviation was noted.   LV perfusion is abnormal. There is no evidence of ischemia. There is evidence of infarction. Defect 1: There is a medium defect with severe reduction in uptake present in the apical to mid inferolateral location(s) that is fixed. There is normal wall motion in the defect area. Consistent with infarction.   Left ventricular function is normal.   Prior study available for comparison from 03/05/2005. There are changes compared to prior study which appear to be new. The left ventricular ejection fraction has normalized.   Anterior perfusion defect that is present at rest but not stress, consistent with breast attenuation artifact. Basal to mid inferolateral perfusion defect at rest and stress consistent with prior infarct in the LCX region.  There is no ischemia and wall motion is normal in this region.   Overall, low risk study.  Echo 11/06/20:  1. Left ventricular ejection fraction, by estimation, is 45 to 50%. The  left ventricle has mildly decreased function. The left ventricle  demonstrates regional wall motion abnormalities (see scoring  diagram/findings for description). Left ventricular  diastolic parameters are consistent with Grade I diastolic dysfunction  (impaired relaxation). There is severe hypokinesis of the left  ventricular, mid-apical inferolateral wall.   2. Right ventricular systolic function is normal. The right ventricular  size is normal.   3. The mitral valve is rheumatic. Trivial mitral valve regurgitation. No  evidence of mitral stenosis.   4. The aortic valve is tricuspid. Aortic valve regurgitation is not   visualized. No aortic stenosis is present.   5. The inferior vena cava is normal in size with greater than 50%  respiratory variability, suggesting right atrial pressure of 3 mmHg.  Recent Labs: 06/17/2021: TSH 1.380 08/31/2021: ALT 22 09/08/2021: BUN 28; Creatinine, Ser 1.74; Hemoglobin 11.6; Platelets 553.0; Potassium 4.9; Sodium 136    Lipid Panel    Component Value Date/Time   CHOL 180 07/07/2021 1049   CHOL 204 (H) 01/02/2021 0838   TRIG 180.0 (H) 07/07/2021 1049   HDL 48.40 07/07/2021 1049   HDL 59 01/02/2021 0838   CHOLHDL 4 07/07/2021 1049   VLDL  36.0 07/07/2021 1049   LDLCALC 96 07/07/2021 1049   LDLCALC 130 (H) 01/02/2021 0838      Wt Readings from Last 3 Encounters:  10/09/21 165 lb (74.8 kg)  09/08/21 163 lb (73.9 kg)  08/31/21 167 lb 5.3 oz (75.9 kg)      ASSESSMENT AND PLAN:  # CAD s/p NSTEMI: [name] [name] is doing well and denies recurrent chest pain.  Continue aspirin, Plavix, metoprolol and atorvastatin.  Stop aspirin and continue Plavix and coumadin only.   # Atrial fibrillation: She is currently in sinus rhythm and has not noted any recurrent palpitations.  Her heart rate has improved with the reduction in her metoprolol. Continue warfarin. This patients CHA2DS2-VASc Score and unadjusted Ischemic Stroke Rate (% per year) is equal to 9.7 % stroke rate/year from a score of 6  Above score calculated as 1 point each if present [CHF, HTN, DM, Vascular=MI/PAD/Aortic Plaque, Age if 65-74, or Female] Above score calculated as 2 points each if present [Age > 75, or Stroke/TIA/TE]  # Chronic systolic and diastolic heart failure: She appears to be euvolemic on exam and is doing well.  Continue lisinopril, lasix and metoprolol as above.    # Hypertensive heart disease: Blood pressure is well-controlled today.  Continue metoprolol and lisinopril.  # Nausea: [name] [name] reports nausea and emesis lately. The last time this happened she had a urinary tract infection. We will  check a urinalysis and a CBC today. If this is within normal limits and he recommended that she follow-up with a gastroenterologist.  Current medicines are reviewed at length with the patient today.  The patient does not have concerns regarding medicines.  The following changes have been made:  Stop aspirin Labs/ tests ordered today include:   No orders of the defined types were placed in this encounter.    Disposition:   FU with Ashly Yepez C. Oval Linsey, MD, [facility] in 6 months.    This note was written with the assistance of speech recognition software.  Please excuse any transcriptional errors.  Signed, Jazmynn Pho C. Oval Linsey, MD, [facility]  10/28/2021 10:23 PM    [place]

## 2021-10-29 ENCOUNTER — Encounter (HOSPITAL_BASED_OUTPATIENT_CLINIC_OR_DEPARTMENT_OTHER): Payer: Self-pay | Admitting: Cardiovascular Disease

## 2021-10-29 ENCOUNTER — Ambulatory Visit (INDEPENDENT_AMBULATORY_CARE_PROVIDER_SITE_OTHER): Payer: Medicare Other | Admitting: Cardiovascular Disease

## 2021-10-29 VITALS — BP 112/56 | HR 58 | Ht 69.0 in | Wt 170.8 lb

## 2021-10-29 DIAGNOSIS — I4891 Unspecified atrial fibrillation: Secondary | ICD-10-CM

## 2021-10-29 DIAGNOSIS — Z9861 Coronary angioplasty status: Secondary | ICD-10-CM | POA: Diagnosis not present

## 2021-10-29 DIAGNOSIS — I10 Essential (primary) hypertension: Secondary | ICD-10-CM | POA: Diagnosis not present

## 2021-10-29 DIAGNOSIS — I2510 Atherosclerotic heart disease of native coronary artery without angina pectoris: Secondary | ICD-10-CM | POA: Diagnosis not present

## 2021-10-29 NOTE — Patient Instructions (Signed)
Medication Instructions:  Your physician recommends that you continue on your current medications as directed. Please refer to the Current Medication list given to you today.  *If you need a refill on your cardiac medications before your next appointment, please call your pharmacy*  Lab Work: NONE  Testing/Procedures: NONE  Follow-Up: At [facility], you and your health needs are our priority.  As part of our continuing mission to provide you with exceptional heart care, we have created designated Provider Care Teams.  These Care Teams include your primary Cardiologist (physician) and Advanced Practice Providers (APPs -  Physician Assistants and Nurse Practitioners) who all work together to provide you with the care you need, when you need it.  We recommend signing up for the patient portal called "MyChart".  Sign up information is provided on this After Visit Summary.  MyChart is used to connect with patients for Virtual Visits (Telemedicine).  Patients are able to view lab/test results, encounter notes, upcoming appointments, etc.  Non-urgent messages can be sent to your provider as well.   To learn more about what you can do with MyChart, go to [email or web address].    Your next appointment:   12 month(s)  The format for your next appointment:   In Person  Provider:   Tiffany Duchesne, MD     

## 2021-10-29 NOTE — Progress Notes (Signed)
Cardiology Office Note   Date:  10/29/2021   ID:  [name] [name], [name] [name] 08/22/1940, MRN [id number]  PCP:  Colon Branch, MD  Cardiologist:   Skeet Latch, MD   No chief complaint on file.    History of Present Illness: [name] [name] is a 81 y.o. 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  At her last appointment she was noted to be bradycardic into the 40s. Metoprolol was reduced to 50 mg twice a day.  Her lisinopril was increased to 5 mg daily.    [name] [name] saw Dr. Quentin Ore 10/2020 and reported two episodes of chest pain. It was unclear whether this was related to atrial fibrillation.  She wore a 3 day Zio that showed 26 episodes of SVT up to 13 beats.  Nuclear stress revealed LVEF 45-50% with grade 1 diastolic dysfunction and mid-apical inferolateral hypokinesis.  Nuclear stress showed prior infarct but no ischemia.  Today, she is accompanied by her husband. She discusses that she cannot walk too well and becomes unstable. She has fallen 2-3 times and has broken a hip and leg as a result. She reports she does not get much formal exercise. As of now, cleaning her house once a week is her main source of exercise. She denies any palpitations, chest pain, shortness of breath, or peripheral edema. No lightheadedness, headaches, syncope, orthopnea, or PND.   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   HYPERLIPIDEMIA 03/27/2006   HYPERTENSION 03/27/2006   Intertrochanteric fracture of right hip ([place]) 07/13/2012   LUNG NODULE 09/01/2006   Excision, Bx [facility]   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"   Osteopenia 2004   Dexa 2004 showed Osteopenia, DEXA 03/2007 normal   Osteopenia    Other chronic cystitis with hematuria    Pelvic fracture ([place]) 06/16/2016   S/P fall   Recurrent urinary tract infection    Seeing Urology   RETINOPATHY, 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/27/2006   Seizures ([place])    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[place]) 9/92/4268    Past Surgical History:  Procedure Laterality Date   ANKLE FRACTURE SURGERY Bilateral 08/2015   "right one was in 2 places"   CARDIAC CATHETERIZATION N/A 05/17/2015   Procedure: Left Heart Cath and Coronary Angiography;  Surgeon: Jettie Booze, MD;  Location: [facility];  Service: Cardiovascular;  Laterality: N/A;   CARDIAC CATHETERIZATION N/A 05/17/2015   Procedure: Coronary Balloon Angioplasty;  Surgeon: Jettie Booze, MD;  Location: [facility];  Service: Cardiovascular;  Laterality: N/A;   FEMUR IM NAIL Right 07/15/2012   Procedure: INTRAMEDULLARY (IM) NAIL HIP;  Surgeon: Johnny Bridge, MD;  Location: [facility];  Service: Orthopedics;  Laterality: Right;   FEMUR IM NAIL Left 12/02/2015   Procedure: INTRAMEDULLARY (IM) NAIL FEMORAL;  Surgeon: [name], MD;  Location: [facility];  Service: Orthopedics;  Laterality: Left;   FRACTURE SURGERY     INCISION AND DRAINAGE HIP Left 01/03/2016   Procedure: IRRIGATION AND DEBRIDEMENT HIP;  Surgeon: [name], MD;  Location: [facility];  Service: Orthopedics;  Laterality: Left;   SHOULDER SURGERY Bilateral    "don't remember which side"     Current Outpatient Medications  Medication Sig Dispense Refill   alendronate (FOSAMAX) 35 MG tablet TAKE 1 TABLET BY MOUTH ONCE A WEEK IN THE MORNING WITH A FULL GLASS OF WATER, ON AN EMPTY STOMACH 12 tablet 3    atorvastatin (LIPITOR) 80 MG tablet Take 1 tablet by mouth once daily 90 tablet 1   ezetimibe (ZETIA) 10 MG tablet TAKE 1 TABLET BY MOUTH ONCE DAILY.  NEED  APPOINTMENT 90 tablet 0   furosemide (LASIX) 40 MG tablet TAKE 1 TABLET BY MOUTH TWICE DAILY AS NEEDED FOR FLUID 180 tablet 1   insulin NPH-regular Human (NOVOLIN 70/30 RELION) (70-30) 100 UNIT/ML injection 26 units with breakfast and 2 units with the evening meal. 20 mL 3   metoprolol succinate (TOPROL-XL) 50 MG 24 hr tablet TAKE 1 TABLET BY MOUTH ONCE DAILY . APPOINTMENT REQUIRED FOR FUTURE REFILLS 90 tablet 0   Multiple Vitamins-Minerals (MULTIVITAMIN ADULT PO) Take 1 tablet by mouth daily with breakfast.      VITAMIN D, CHOLECALCIFEROL, PO Take by mouth.     warfarin (COUMADIN) 2.5 MG tablet TAKE 1/2 TO 1 TABLET BY MOUTH ONCE DAILY AS DIRECTED BY  COUMADIN  CLINIC 70 tablet 1   No current facility-administered medications for this visit.    Allergies:   Procaine hcl and Amoxicillin    Social History:  The patient  reports that she quit smoking about 43 years ago. Her smoking use included cigarettes. She has never used smokeless tobacco. She reports that she does not drink alcohol and does not use drugs.   Family History:  The patient's  family history includes Diabetes in her father and sister; Drug abuse in her son; Heart attack (age of onset: 74) in her father.    ROS:  Please see the history of present illness.  All other systems are reviewed and negative.    PHYSICAL EXAM: VS:  BP (!) 112/56 (BP Location: Right Arm, Patient Position: Sitting, Cuff Size: Large)   Pulse (!) 58   Ht 5\' 9"  (1.753 m)   Wt 170 lb 12.8 oz (77.5 kg)   SpO2 94%   BMI 25.22 kg/m  , BMI Body mass index is 25.22 kg/m. GENERAL:  Well appearing HEENT:  Pupils equal round and reactive, fundi not visualized, oral mucosa unremarkable NECK:  No jugular venous distention, waveform within normal limits, carotid upstroke brisk and symmetric, no  bruits LUNGS:  Clear to auscultation bilaterally HEART:  RRR.  PMI not displaced or sustained,S1 and S2 within normal limits, no S3, no S4, no clicks, no rubs, no murmurs ABD:  Flat, positive bowel sounds normal in frequency in pitch, no bruits, no rebound, no guarding, no midline pulsatile mass, no hepatomegaly, no splenomegaly EXT:  2 plus pulses throughout, no edema, no cyanosis no clubbing SKIN:  No rashes no nodules NEURO:  Cranial nerves II through XII grossly intact, motor grossly intact throughout PSYCH:  Cognitively intact, oriented to person place and time  EKG:  EKG is personally reviewed.   10/29/21: EKG was not ordered.   Sinus Rhythm. Rate 74 bpm. Lateral T-wave inversions. Unchanged from prior, no bradycardia has  resolved  Other studies reviewed:  Long Term Monitor 11/2020: HR 45 - 148bpm, average 56bpm. 1 NSVT lasting 4 beats. 26 SVT, longest lasting 13 beats. Rare supraventricular ectopy and ventricular ectopy. No sustained arrhythmias.   Lexiscan Myoview 11/19/20:   Findings are consistent with prior myocardial infarction. The study is low risk.   No ST deviation was noted.   LV perfusion is abnormal. There is no evidence of ischemia. There is evidence of infarction. Defect 1: There is a medium defect with severe reduction in uptake present in the apical to mid inferolateral location(s) that is fixed. There is normal wall motion in the defect area. Consistent with infarction.   Left ventricular function is normal.   Prior study available for comparison from 03/05/2005. There are changes compared to prior study which appear to be new. The left ventricular ejection fraction has normalized.   Anterior perfusion defect that is present at rest but not stress, consistent with breast attenuation artifact. Basal to mid inferolateral perfusion defect at rest and stress consistent with prior infarct in the LCX region.  There is no ischemia and wall motion is normal in this region.    Overall, low risk study.  Echo 11/06/20:  1. Left ventricular ejection fraction, by estimation, is 45 to 50%. The  left ventricle has mildly decreased function. The left ventricle  demonstrates regional wall motion abnormalities (see scoring  diagram/findings for description). Left ventricular  diastolic parameters are consistent with Grade I diastolic dysfunction  (impaired relaxation). There is severe hypokinesis of the left  ventricular, mid-apical inferolateral wall.   2. Right ventricular systolic function is normal. The right ventricular  size is normal.   3. The mitral valve is rheumatic. Trivial mitral valve regurgitation. No  evidence of mitral stenosis.   4. The aortic valve is tricuspid. Aortic valve regurgitation is not  visualized. No aortic stenosis is present.   5. The inferior vena cava is normal in size with greater than 50%  respiratory variability, suggesting right atrial pressure of 3 mmHg.    LHC 05/17/15: There is moderate left ventricular systolic dysfunction. 1st Mrg lesion, 100% stenosed. Intervention was performed with balloon angioplasty. No stent was placed. At the end of the procedure, the initial occlusion site had no residual stenosis. There did remain a large branch downstream of the initial occlusion which remained occluded and did not respond to angioplasty. There is a smaller branch which had TIMI-3 flow restored. The patient was pain-free at the end of the procedure. Moderate LV dysfunction. Moderately elevated LVEDP.  Echo 05/10/15: Study Conclusions   - Left ventricle: The cavity size was normal. There was moderate   concentric hypertrophy. Systolic function was moderately reduced.   The estimated ejection fraction was in the range of 35% to 40%.   Diffuse hypokinesis. The study was not technically sufficient to   allow evaluation of LV diastolic dysfunction due to atrial   fibrillation. - Aortic valve: Trileaflet; normal thickness leaflets. There  was no   regurgitation. - Aortic root: The aortic root was normal in size. - Mitral valve: Mildly thickened leaflets . There was mild   regurgitation. - Left atrium: The atrium was moderately dilated. - Right ventricle: The cavity size was normal. Wall thickness was   normal. Systolic function was normal. - Right atrium: The atrium was normal in size. - Tricuspid valve: There was moderate regurgitation. - Pulmonic valve: There was no regurgitation. - Pulmonary arteries: Systolic pressure was mildly to moderately   increased.  PA peak pressure: 45 mm Hg (S). - Inferior vena cava: The vessel was normal in size. - Pericardium, extracardiac: There was no pericardial effusion.     Recent Labs: 06/17/2021: TSH 1.380 08/31/2021: ALT 22 09/08/2021: BUN 28; Creatinine, Ser 1.74; Hemoglobin 11.6; Platelets 553.0; Potassium 4.9; Sodium 136    Lipid Panel    Component Value Date/Time   CHOL 180 07/07/2021 1049   CHOL 204 (H) 01/02/2021 0838   TRIG 180.0 (H) 07/07/2021 1049   HDL 48.40 07/07/2021 1049   HDL 59 01/02/2021 0838   CHOLHDL 4 07/07/2021 1049   VLDL 36.0 07/07/2021 1049   LDLCALC 96 07/07/2021 1049   LDLCALC 130 (H) 01/02/2021 0838      Wt Readings from Last 3 Encounters:  10/29/21 170 lb 12.8 oz (77.5 kg)  10/09/21 165 lb (74.8 kg)  09/08/21 163 lb (73.9 kg)      ASSESSMENT AND PLAN: No problem-specific Assessment & Plan notes found for this encounter.   # CAD s/p NSTEMI:  # Dyslipidemia: Ms. Byington is doing well and denies recurrent chest pain.  Continue aspirin, Plavix, metoprolol, Zetia, and atorvastatin.    - Patient's LDL cholesterol is elevated at 98, with a goal of under 70 due to a history of coronary artery blockage.    Plan:       a. Continue current medications (atorvastatin and Zetia) as tolerated.       b. Educate patient on the importance of maintaining a heart-healthy diet and lifestyle modifications to help lower LDL cholesterol.       c. Recheck lipid  panel in 3-6 months to assess response to treatment and consider alternative or additional medications if needed.'  # PAF:  No recent atrial fibrillation episodes.  Continue metoprolol and warfarin.   #Chronic systolic and diastolic heart failure: LVEF 45 to 50% 10/2020.  She is euvolemic and doing well.  Blood pressures are on the low side.  She is not interested in starting any new medications given that she is stable at this time.  Continue metoprolol.  # Limited mobility and history of falls    - Patient reports difficulty walking and has experienced falls in the past, resulting in a broken hip, leg, and ankle. Currently using a walking aid.    Plan:       a. Encourage patient to continue using the walking aid for stability and safety.       b. Consider referral to physical therapy for evaluation and recommendations on safe exercises and fall prevention strategies.  #  Exercise intolerance    - Patient reports limited exercise capacity due to mobility issues and relies on house cleaning for physical activity.    Plan:       a. Discussed the potential benefits of chair exercises or other low-impact activities to improve cardiovascular health.       b. Monitor patient's exercise tolerance and functional capacity during follow-up visits.  # Hypertension:  BP controlled on metoprolol.  Medications Adjusted/Labs and Tests Ordered: Current medicines are reviewed at length with the patient today.  The patient does not have concerns regarding medicines.  The following changes have been made:  Stop aspirin Labs/ tests ordered today include:   No orders of the defined types were placed in this encounter.   Disposition:   FU with Derrion Tritz C. Oval Linsey, MD, [facility] in 1 year.     I,Breanna Adamick,acting as a scribe for Skeet Latch, MD.,have documented all relevant documentation on the  behalf of Skeet Latch, MD,as directed by  Skeet Latch, MD while in the presence of Skeet Latch, MD.   I, [place] Oval Linsey, MD have reviewed all documentation for this visit.  The documentation of the exam, diagnosis, procedures, and orders on 10/29/2021 are all accurate and complete.   Signed, Akanksha Bellmore C. Oval Linsey, MD, [facility]  10/29/2021 8:47 AM    [place]

## 2021-10-31 ENCOUNTER — Other Ambulatory Visit: Payer: Self-pay | Admitting: Cardiovascular Disease

## 2021-10-31 NOTE — Telephone Encounter (Signed)
Rx request sent to pharmacy.  

## 2021-11-07 ENCOUNTER — Ambulatory Visit: Payer: Medicare Other | Attending: Internal Medicine

## 2021-11-07 DIAGNOSIS — I480 Paroxysmal atrial fibrillation: Secondary | ICD-10-CM | POA: Diagnosis not present

## 2021-11-07 DIAGNOSIS — Z5181 Encounter for therapeutic drug level monitoring: Secondary | ICD-10-CM | POA: Insufficient documentation

## 2021-11-07 DIAGNOSIS — I482 Chronic atrial fibrillation, unspecified: Secondary | ICD-10-CM | POA: Diagnosis not present

## 2021-11-07 LAB — POCT INR: INR: 1.6 — AB (ref 2.0–3.0)

## 2021-11-07 NOTE — Patient Instructions (Signed)
TAKE 2 TABLETS TODAY ONLY and then Continue taking warfarin 1/2 a tablet daily except 1 tablet on Monday,Wednesday and Friday. Stay consistent with leafy veggies to your diet. Recheck INR in 3 weeks. [facility] [phone].

## 2021-11-28 ENCOUNTER — Ambulatory Visit: Payer: Medicare Other | Attending: Internal Medicine | Admitting: *Deleted

## 2021-11-28 DIAGNOSIS — Z5181 Encounter for therapeutic drug level monitoring: Secondary | ICD-10-CM

## 2021-11-28 DIAGNOSIS — I482 Chronic atrial fibrillation, unspecified: Secondary | ICD-10-CM | POA: Diagnosis not present

## 2021-11-28 DIAGNOSIS — I480 Paroxysmal atrial fibrillation: Secondary | ICD-10-CM | POA: Diagnosis not present

## 2021-11-28 LAB — POCT INR: INR: 2.2 (ref 2.0–3.0)

## 2021-11-28 NOTE — Patient Instructions (Signed)
Description   Continue taking warfarin 1/2 a tablet daily except 1 tablet on Monday,Wednesday and Friday. Stay consistent with leafy veggies to your diet. Recheck INR in 4 weeks. [facility] [phone].

## 2021-12-04 ENCOUNTER — Other Ambulatory Visit: Payer: Self-pay | Admitting: Cardiovascular Disease

## 2021-12-04 NOTE — Telephone Encounter (Signed)
Rx(s) sent to pharmacy electronically.  

## 2021-12-08 ENCOUNTER — Ambulatory Visit: Payer: Medicare Other | Admitting: Internal Medicine

## 2021-12-16 ENCOUNTER — Ambulatory Visit (INDEPENDENT_AMBULATORY_CARE_PROVIDER_SITE_OTHER): Payer: Medicare Other

## 2021-12-16 DIAGNOSIS — Z23 Encounter for immunization: Secondary | ICD-10-CM

## 2021-12-26 ENCOUNTER — Ambulatory Visit: Payer: Medicare Other | Attending: Internal Medicine | Admitting: *Deleted

## 2021-12-26 DIAGNOSIS — Z5181 Encounter for therapeutic drug level monitoring: Secondary | ICD-10-CM | POA: Diagnosis not present

## 2021-12-26 DIAGNOSIS — I482 Chronic atrial fibrillation, unspecified: Secondary | ICD-10-CM | POA: Diagnosis not present

## 2021-12-26 DIAGNOSIS — I480 Paroxysmal atrial fibrillation: Secondary | ICD-10-CM

## 2021-12-26 LAB — POCT INR: INR: 5.3 — AB (ref 2.0–3.0)

## 2021-12-26 NOTE — Patient Instructions (Signed)
Description   Do not take any warfarin today and no warfarin tomorrow and take 1/2 tablet Sunday then continue taking warfarin 1/2 a tablet daily except 1 tablet on Monday, Wednesday and Friday. Stay consistent with leafy veggies to your diet. Recheck INR in 1 week. [facility] [phone].

## 2022-01-02 ENCOUNTER — Ambulatory Visit: Payer: Medicare Other | Attending: Cardiovascular Disease

## 2022-01-02 DIAGNOSIS — I4891 Unspecified atrial fibrillation: Secondary | ICD-10-CM

## 2022-01-02 DIAGNOSIS — Z5181 Encounter for therapeutic drug level monitoring: Secondary | ICD-10-CM

## 2022-01-02 LAB — POCT INR: INR: 1.8 — AB (ref 2.0–3.0)

## 2022-01-02 NOTE — Patient Instructions (Signed)
TAKE 1.5 TABLETS TODAY ONLY and then continue taking warfarin 1/2 a tablet daily except 1 tablet on Monday, Wednesday and Friday. Stay consistent with leafy veggies to your diet. Recheck INR in 3 weeks. [facility] [phone].

## 2022-01-20 ENCOUNTER — Emergency Department (HOSPITAL_COMMUNITY): Payer: Medicare Other

## 2022-01-20 ENCOUNTER — Emergency Department (HOSPITAL_COMMUNITY)
Admission: EM | Admit: 2022-01-20 | Discharge: 2022-01-20 | Disposition: A | Discharge status: Home / Self Care | Payer: Medicare Other | Attending: Emergency Medicine | Admitting: Emergency Medicine

## 2022-01-20 ENCOUNTER — Other Ambulatory Visit: Payer: Self-pay

## 2022-01-20 ENCOUNTER — Encounter (HOSPITAL_COMMUNITY): Payer: Self-pay | Admitting: Radiology

## 2022-01-20 DIAGNOSIS — I4891 Unspecified atrial fibrillation: Secondary | ICD-10-CM | POA: Insufficient documentation

## 2022-01-20 DIAGNOSIS — R1013 Epigastric pain: Secondary | ICD-10-CM | POA: Insufficient documentation

## 2022-01-20 DIAGNOSIS — E1122 Type 2 diabetes mellitus with diabetic chronic kidney disease: Secondary | ICD-10-CM | POA: Diagnosis not present

## 2022-01-20 DIAGNOSIS — I5020 Unspecified systolic (congestive) heart failure: Secondary | ICD-10-CM | POA: Insufficient documentation

## 2022-01-20 DIAGNOSIS — K869 Disease of pancreas, unspecified: Secondary | ICD-10-CM | POA: Insufficient documentation

## 2022-01-20 DIAGNOSIS — Z7902 Long term (current) use of antithrombotics/antiplatelets: Secondary | ICD-10-CM | POA: Insufficient documentation

## 2022-01-20 DIAGNOSIS — I130 Hypertensive heart and chronic kidney disease with heart failure and stage 1 through stage 4 chronic kidney disease, or unspecified chronic kidney disease: Secondary | ICD-10-CM | POA: Diagnosis not present

## 2022-01-20 DIAGNOSIS — R519 Headache, unspecified: Secondary | ICD-10-CM

## 2022-01-20 DIAGNOSIS — N183 Chronic kidney disease, stage 3 unspecified: Secondary | ICD-10-CM | POA: Diagnosis not present

## 2022-01-20 DIAGNOSIS — N3000 Acute cystitis without hematuria: Secondary | ICD-10-CM | POA: Insufficient documentation

## 2022-01-20 DIAGNOSIS — U071 COVID-19: Secondary | ICD-10-CM | POA: Insufficient documentation

## 2022-01-20 LAB — CBC WITH DIFFERENTIAL/PLATELET
Abs Immature Granulocytes: 0.03 10*3/uL (ref 0.00–0.07)
Basophils Absolute: 0 10*3/uL (ref 0.0–0.1)
Basophils Relative: 0 %
Eosinophils Absolute: 0 10*3/uL (ref 0.0–0.5)
Eosinophils Relative: 0 %
HCT: 38.4 % (ref 36.0–46.0)
Hemoglobin: 12.4 g/dL (ref 12.0–15.0)
Immature Granulocytes: 0 %
Lymphocytes Relative: 18 %
Lymphs Abs: 1.6 10*3/uL (ref 0.7–4.0)
MCH: 29.3 pg (ref 26.0–34.0)
MCHC: 32.3 g/dL (ref 30.0–36.0)
MCV: 90.8 fL (ref 80.0–100.0)
Monocytes Absolute: 1.1 10*3/uL — ABNORMAL HIGH (ref 0.1–1.0)
Monocytes Relative: 12 %
Neutro Abs: 6.2 10*3/uL (ref 1.7–7.7)
Neutrophils Relative %: 70 %
Platelets: 388 10*3/uL (ref 150–400)
RBC: 4.23 MIL/uL (ref 3.87–5.11)
RDW: 12.8 % (ref 11.5–15.5)
WBC: 8.9 10*3/uL (ref 4.0–10.5)
nRBC: 0 % (ref 0.0–0.2)

## 2022-01-20 LAB — URINALYSIS, ROUTINE W REFLEX MICROSCOPIC
Bilirubin Urine: NEGATIVE
Glucose, UA: NEGATIVE mg/dL
Ketones, ur: NEGATIVE mg/dL
Nitrite: NEGATIVE
Protein, ur: NEGATIVE mg/dL
Specific Gravity, Urine: 1.01 (ref 1.005–1.030)
pH: 8 (ref 5.0–8.0)

## 2022-01-20 LAB — COMPREHENSIVE METABOLIC PANEL
ALT: 25 U/L (ref 0–44)
AST: 36 U/L (ref 15–41)
Albumin: 2.4 g/dL — ABNORMAL LOW (ref 3.5–5.0)
Alkaline Phosphatase: 54 U/L (ref 38–126)
Anion gap: 6 (ref 5–15)
BUN: 30 mg/dL — ABNORMAL HIGH (ref 8–23)
CO2: 26 mmol/L (ref 22–32)
Calcium: 7.2 mg/dL — ABNORMAL LOW (ref 8.9–10.3)
Chloride: 107 mmol/L (ref 98–111)
Creatinine, Ser: 1.39 mg/dL — ABNORMAL HIGH (ref 0.44–1.00)
GFR, Estimated: 38 mL/min — ABNORMAL LOW (ref >60)
Glucose, Bld: 79 mg/dL (ref 70–99)
Potassium: 3.2 mmol/L — ABNORMAL LOW (ref 3.5–5.1)
Sodium: 139 mmol/L (ref 135–145)
Total Bilirubin: 0.7 mg/dL (ref 0.3–1.2)
Total Protein: 5.6 g/dL — ABNORMAL LOW (ref 6.5–8.1)

## 2022-01-20 LAB — LIPASE, BLOOD: Lipase: 21 U/L (ref 11–51)

## 2022-01-20 LAB — TROPONIN I (HIGH SENSITIVITY)
Troponin I (High Sensitivity): 12 ng/L (ref <18)
Troponin I (High Sensitivity): 7 ng/L (ref <18)

## 2022-01-20 LAB — PROTIME-INR
INR: 2.5 — ABNORMAL HIGH (ref 0.8–1.2)
Prothrombin Time: 27 seconds — ABNORMAL HIGH (ref 11.4–15.2)

## 2022-01-20 LAB — RESP PANEL BY RT-PCR (FLU A&B, COVID) ARPGX2
Influenza A by PCR: NEGATIVE
Influenza B by PCR: NEGATIVE
SARS Coronavirus 2 by RT PCR: POSITIVE — AB

## 2022-01-20 MED ORDER — METOPROLOL SUCCINATE ER 50 MG PO TB24
50.0000 mg | ORAL_TABLET | Freq: Every day | ORAL | Status: DC
  Administered 2022-01-20: 50 mg via ORAL
  Filled 2022-01-20: qty 1

## 2022-01-20 MED ORDER — LACTATED RINGERS IV BOLUS
1000.0000 mL | Freq: Once | INTRAVENOUS | Status: AC
  Administered 2022-01-20: 1000 mL via INTRAVENOUS

## 2022-01-20 MED ORDER — METOPROLOL TARTRATE 5 MG/5ML IV SOLN
5.0000 mg | Freq: Once | INTRAVENOUS | Status: AC
  Administered 2022-01-20: 5 mg via INTRAVENOUS
  Filled 2022-01-20: qty 5

## 2022-01-20 MED ORDER — IOHEXOL 300 MG/ML  SOLN
100.0000 mL | Freq: Once | INTRAMUSCULAR | Status: AC | PRN
  Administered 2022-01-20: 75 mL via INTRAVENOUS

## 2022-01-20 MED ORDER — SODIUM CHLORIDE 0.9 % IV BOLUS
500.0000 mL | Freq: Once | INTRAVENOUS | Status: AC
  Administered 2022-01-20: 500 mL via INTRAVENOUS

## 2022-01-20 MED ORDER — SODIUM CHLORIDE (PF) 0.9 % IJ SOLN
INTRAMUSCULAR | Status: AC
  Filled 2022-01-20: qty 50

## 2022-01-20 MED ORDER — CEPHALEXIN 500 MG PO CAPS: 500.0000 mg | ORAL_CAPSULE | Freq: Two times a day (BID) | ORAL | 0 refills | Status: AC

## 2022-01-20 MED ORDER — SODIUM CHLORIDE 0.9 % IV SOLN
1.0000 g | Freq: Once | INTRAVENOUS | Status: AC
  Administered 2022-01-20: 1 g via INTRAVENOUS
  Filled 2022-01-20: qty 10

## 2022-01-20 MED ORDER — ACETAMINOPHEN 500 MG PO TABS
1000.0000 mg | ORAL_TABLET | Freq: Once | ORAL | Status: AC
  Administered 2022-01-20: 1000 mg via ORAL
  Filled 2022-01-20: qty 2

## 2022-01-20 NOTE — Discharge Instructions (Addendum)
You were seen in the ED today with headache. You have a urinary tract infection. The CT scan shows an irregular wall of your bladder. This could be from infection but I would like you to call the Urologist listed to consider if you need further testing for this. Your pancreas also shows a lesion. Please call your primary care doctor to decide on further imaging for this.   Please take antibiotics as prescribed for your bladder infection.  Please follow-up with the urologist as listed above.  You will need to call to make an appointment.  Return with any new or suddenly worsening symptoms.

## 2022-01-20 NOTE — ED Provider Notes (Signed)
Emergency Department Provider Note   I have reviewed the triage vital signs and the nursing notes.   HISTORY  Chief Complaint Abdominal Pain   HPI Connecticut Kissel is a 81 y.o. female with past history of A-fib on Coumadin presents to the emergency department for evaluation of headache, chest discomfort, abdominal discomfort.  She has had symptoms developing over the past week.  Denies lower abdominal pain, dysuria, hesitancy, urgency.  She states that her husband was diagnosed with COVID last week and she began with symptoms 6 days ago.  Denies fevers.  Headache feels like her chronic headaches but she is having them more frequently.  No sudden onset/maximal intensity headache symptoms.  No vomiting or diarrhea.   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   HYPERLIPIDEMIA 03/27/2006   HYPERTENSION 03/27/2006   Intertrochanteric fracture of right hip ([place]) 07/13/2012   LUNG NODULE 09/01/2006   Excision, Bx Benighn   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"   Osteopenia 2004   Dexa 2004 showed Osteopenia, DEXA 03/2007 normal   Osteopenia    Other chronic cystitis with hematuria    Pelvic fracture ([place]) 06/16/2016   S/P fall   Recurrent urinary tract infection    Seeing Urology   RETINOPATHY, 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 ENT: No sore throat. Positive congestion.  Cardiovascular: Denies chest  pain. Respiratory: Denies shortness of breath. Gastrointestinal: Positive abdominal pain.  Positive nausea, no vomiting. Positive diarrhea.  No constipation. Genitourinary: Negative for dysuria. Musculoskeletal: Negative for back pain. Skin: Negative for rash. Neurological: Negative for headaches, focal weakness or numbness. ____________________________________________   PHYSICAL EXAM:  VITAL SIGNS: ED Triage Vitals  Enc Vitals Group     BP 01/20/22 1253 117/69     Pulse Rate 01/20/22 1253 100     Resp 01/20/22 1253 12     Temp 01/20/22 1253 (!) 97.4 F (36.3 C)     Temp Source 01/20/22 1253 Oral     SpO2 01/20/22 1253 99 %     Weight 01/20/22 1252 170 lb 13.7 oz (77.5 kg)     Height 01/20/22 1252 5\' 9"  (1.753 m)   Constitutional: Alert and oriented. Well appearing and in no acute distress. Eyes: Conjunctivae are normal. Head: Atraumatic. Nose: No congestion/rhinnorhea. Mouth/Throat: Mucous membranes are moist.  Oropharynx non-erythematous. Neck: No stridor.  Cardiovascular: Normal rate, regular rhythm. Good peripheral circulation. Grossly normal heart sounds.   Respiratory: Normal respiratory effort.  No retractions. Lungs CTAB. Gastrointestinal: Soft and nontender. No distention. Musculoskeletal: No lower extremity tenderness nor edema. No gross deformities of extremities. Neurologic:  Normal speech and language. No gross focal neurologic deficits are appreciated.  Skin:  Skin is warm, dry and intact. No rash noted.  ____________________________________________   LABS (all labs ordered are listed, but only abnormal results are displayed)  Labs Reviewed  RESP PANEL BY RT-PCR (FLU A&B, COVID) ARPGX2 - Abnormal; Notable for the following components:      Result Value   SARS Coronavirus 2 by RT PCR POSITIVE (*)    All other components within normal limits  URINE CULTURE - Abnormal; Notable for the following components:   Culture MULTIPLE SPECIES PRESENT, SUGGEST  RECOLLECTION (*)    All other components within normal limits  COMPREHENSIVE METABOLIC PANEL - Abnormal; Notable for the following components:   Potassium 3.2 (*)    BUN 30 (*)    Creatinine, Ser 1.39 (*)    Calcium 7.2 (*)    Total Protein 5.6 (*)    Albumin 2.4 (*)    GFR, Estimated 38 (*)    All other components within normal limits  CBC WITH DIFFERENTIAL/PLATELET - Abnormal; Notable for the following components:   Monocytes Absolute 1.1 (*)    All other components within normal limits  URINALYSIS, ROUTINE W REFLEX MICROSCOPIC - Abnormal; Notable for the following components:   APPearance HAZY (*)    Hgb urine dipstick SMALL (*)    Leukocytes,Ua LARGE (*)    Bacteria, UA RARE (*)    All other components within normal limits  PROTIME-INR - Abnormal; Notable for the following components:   Prothrombin Time 27.0 (*)    INR 2.5 (*)    All other components within normal limits  LIPASE, BLOOD  TROPONIN I (HIGH SENSITIVITY)  TROPONIN I (HIGH SENSITIVITY)   ____________________________________________  EKG   EKG Interpretation  Date/Time:  Tuesday January 20 2022 12:57:18 EST Ventricular Rate:  111 PR Interval:    QRS Duration: 112 QT Interval:  372 QTC Calculation: 506 R Axis:   -38 Text Interpretation: Atrial fibrillation Ventricular premature complex Borderline IVCD with LAD Probable anterior infarct, age indeterminate Lateral leads are also involved Prolonged QT interval Confirmed by Nanda Quinton [phone]) on 01/20/2022 1:13:50 PM        ____________________________________________  RADIOLOGY  CT ABDOMEN PELVIS W CONTRAST  Result Date: 01/20/2022 CLINICAL DATA:  Abdominal pain, acute, nonlocalized. "Pt bib ems from home pt reports RUQ pain, tested positive for covid at home yesterday." EXAM: CT ABDOMEN AND PELVIS WITH CONTRAST TECHNIQUE: Multidetector CT imaging of the abdomen and pelvis was performed using the standard protocol following bolus administration of  intravenous contrast. RADIATION DOSE REDUCTION: This exam was performed according to the departmental dose-optimization program which includes automated exposure control, adjustment of the mA and/or kV according to patient size and/or use of iterative reconstruction technique. CONTRAST:  61mL OMNIPAQUE IOHEXOL 300 MG/ML  SOLN COMPARISON:  CT abdomen pelvis 02/21/2020, CT abdomen pelvis 05/20/2015 FINDINGS: Lower chest: No acute abnormality. Mitral annular calcifications. Tiny hiatal hernia. Hepatobiliary: No focal liver abnormality. Calcified gallstone noted within the gallbladder lumen. No gallbladder wall thickening or pericholecystic fluid. No biliary dilatation. Pancreas: Diffusely atrophic. Interval increase in size of a pancreatic tail fluid dense lesion measuring 1.3 (from 1.1 cm) and stable in size 1.4 cm distal pancreatic body fluid dense lesion (2: 21, 24). Similar-appearing irregular appearance of the pancreatic uncinate process and head with likely underlying fluid lesions. No new lesion. Otherwise normal pancreatic contour. No surrounding inflammatory changes. No main pancreatic ductal dilatation. Spleen: Normal in size without focal abnormality. Adrenals/Urinary Tract: No adrenal nodule bilaterally. Bilateral kidneys enhance symmetrically. Fluid density lesions within bilateral kidneys likely represent simple renal cysts. Simple renal cysts, in the absence of clinically indicated signs/symptoms, require no independent follow-up. Subcentimeter hypodensities are too small  to characterize-no further follow-up indicated. Bilateral 2 mm calcified stones within the kidneys. No ureterolithiasis. No hydronephrosis. Mild bilateral hydroureter. Bilateral urothelial thickening. Irregular urinary bladder wall thickening with associated perivesicular fat stranding. The urinary bladder is unremarkable. Stomach/Bowel: Stomach is within normal limits. No evidence of bowel wall thickening or dilatation. Colonic  diverticulosis. Appendix appears normal. Vascular/Lymphatic: No abdominal aorta or iliac aneurysm. Severe atherosclerotic plaque of the aorta and its branches. No abdominal, pelvic, or inguinal lymphadenopathy. Reproductive: Coarsely calcified subcentimeter lesion within the posterior uterine wall likely a degenerative uterine fibroid. Otherwise uterus and bilateral adnexa are unremarkable. Other: No intraperitoneal free fluid. No intraperitoneal free gas. No organized fluid collection. Musculoskeletal: No abdominal wall hernia or abnormality. Diffusely decreased bone density. No suspicious lytic or blastic osseous lesions. No acute displaced fracture. Multilevel severe degenerative changes of the spine. Chronic T11 and L1 compression fractures. Partially visualized intramedullary nail fixation of the proximal femurs. IMPRESSION: 1. Bilateral urothelial thickening with bilateral mild hydroureter as well as irregular urinary bladder wall thickening and perivesicular fat stranding. Correlate with urinalysis for infection. Given irregularity of the urinary bladder wall thickening-concern for underlying malignancy, recommend direct visualization and urologic consultation. 2. Nonobstructive bilateral nephrolithiasis. 3. Cholelithiasis with no CT evidence of acute cholecystitis. 4. Colonic diverticulosis with no acute diverticulitis. 5. Tiny hiatal hernia. 6. Interval increase in size of a pancreatic tail fluid dense lesion measuring 1.3 (from 1.1 cm). Stable in size 1.4 cm distal pancreatic body fluid dense lesion. Similar-appearing irregular appearance of the pancreatic uncinate process and head with likely underlying fluid lesions. No new lesion. Recommend follow-up MR pancreatic protocol 1 year. Electronically Signed   By: Iven Finn M.D.   On: 01/20/2022 15:30   DG Chest Portable 1 View  Result Date: 01/20/2022 CLINICAL DATA:  Chest pain. EXAM: PORTABLE CHEST 1 VIEW COMPARISON:  Radiographs 02/21/2020 and  01/26/2020. Chest CT 05/05/2006 and abdominal CT 02/20/2020. FINDINGS: 1419 hours. The heart size and mediastinal contours are stable with aortic atherosclerosis and surgical clips in the right paratracheal region. Stable mild linear atelectasis or scarring at both lung bases. The lungs are otherwise clear. No pleural effusion or pneumothorax. Old rib fractures are noted bilaterally. Telemetry leads overlie the chest. IMPRESSION: Stable postoperative chest. No evidence of active cardiopulmonary process. Electronically Signed   By: Richardean Sale M.D.   On: 01/20/2022 14:45    ____________________________________________   PROCEDURES  Procedure(s) performed:   Procedures  None ____________________________________________   INITIAL IMPRESSION / ASSESSMENT AND PLAN / ED COURSE  Pertinent labs & imaging results that were available during my care of the patient were reviewed by me and considered in my medical decision making (see chart for details).   This patient is Presenting for Evaluation of CP, which does require a range of treatment options, and is a complaint that involves a high risk of morbidity and mortality.  The Differential Diagnoses includes but is not exclusive to acute coronary syndrome, aortic dissection, pulmonary embolism, cardiac tamponade, community-acquired pneumonia, pericarditis, musculoskeletal chest wall pain, etc.   Critical Interventions-    Medications  metoprolol tartrate (LOPRESSOR) injection 5 mg (5 mg Intravenous Given 01/20/22 1433)  sodium chloride 0.9 % bolus 500 mL (0 mLs Intravenous Stopped 01/20/22 1648)  iohexol (OMNIPAQUE) 300 MG/ML solution 100 mL (75 mLs Intravenous Contrast Given 01/20/22 1457)  cefTRIAXone (ROCEPHIN) 1 g in sodium chloride 0.9 % 100 mL IVPB (0 g Intravenous Stopped 01/20/22 1907)  acetaminophen (TYLENOL) tablet 1,000 mg (1,000 mg Oral Given 01/20/22  1744)  lactated ringers bolus 1,000 mL (0 mLs Intravenous Stopped 01/20/22  2013)    Reassessment after intervention: Symptoms improved.    I did obtain Additional Historical Information from family at bedside.    Clinical Laboratory Tests Ordered, included COVID positive. Troponin negative.   Radiologic Tests Ordered, included CXR and CT abdomen/pelvis. I independently interpreted the images and agree with radiology interpretation.   Cardiac Monitor Tracing which shows NSR.   Medical Decision Making: Summary:  Patient with abdominal symptoms and CP. COVID positive here but looking well. Care transferred to Dr. Matilde Sprang pending CT and reassess.    Disposition: pending  ____________________________________________  FINAL CLINICAL IMPRESSION(S) / ED DIAGNOSES  Final diagnoses:  Acute nonintractable headache, unspecified headache type  Epigastric pain  Acute cystitis without hematuria  Pancreatic lesion     NEW OUTPATIENT MEDICATIONS STARTED DURING THIS VISIT:  Discharge Medication List as of 01/20/2022  8:12 PM     START taking these medications   Details  cephALEXin (KEFLEX) 500 MG capsule Take 1 capsule (500 mg total) by mouth 2 (two) times daily for 7 days., Starting Tue 01/20/2022, Until Tue 01/27/2022, Normal        Note:  This document was prepared using Dragon voice recognition software and may include unintentional dictation errors.  Nanda Quinton, MD, [facility]    Noah Pelaez, [facility], MD 01/28/22 [id number]

## 2022-01-20 NOTE — ED Provider Notes (Signed)
  Physical Exam  BP (!) 154/103   Pulse (!) 104   Temp (!) 97.4 F (36.3 C) (Oral)   Resp 13   Ht 5\' 9"  (1.753 m)   Wt 77.5 kg   SpO2 99%   BMI 25.23 kg/m   Physical Exam Vitals and nursing note reviewed.  Constitutional:      General: She is not in acute distress.    Appearance: She is well-developed.  HENT:     Head: Normocephalic and atraumatic.  Eyes:     Conjunctiva/sclera: Conjunctivae normal.  Cardiovascular:     Rate and Rhythm: Tachycardia present. Rhythm irregular.     Heart sounds: No murmur heard. Pulmonary:     Effort: Pulmonary effort is normal. No respiratory distress.     Breath sounds: Normal breath sounds.  Abdominal:     Palpations: Abdomen is soft.     Tenderness: There is no abdominal tenderness.  Musculoskeletal:        General: No swelling.     Cervical back: Neck supple.  Skin:    General: Skin is warm and dry.     Capillary Refill: Capillary refill takes less than 2 seconds.  Neurological:     Mental Status: She is alert.  Psychiatric:        Mood and Affect: Mood normal.     Procedures  Procedures  ED Course / MDM   Clinical Course as of 01/20/22 2012  Tue Jan 20, 2022  1712 Pending trop, home on abx, fu with uro [MK]    Clinical Course User Index [MK] Olusegun Gerstenberger, Debe Coder, MD   Medical Decision Making Amount and/or Complexity of Data Reviewed Labs: ordered. Radiology: ordered.  Risk OTC drugs. Prescription drug management.   Patient received an handoff.  Generalized fatigue with known COVID exposure.  Work-up concerning for concomitant UTI on CT imaging.  Patient with irregular tachycardia on my reevaluation and patient states that her A-fib is paroxysmal.  I gave her her home medication and a liter of fluids and symptoms rates minimally improved.  I recommended that the patient be admitted to the hospital for IV antibiotics and possible cardiac consultation if rates do not improve as her UTI and current COVID infection likely  have pushed the patient into A-fib with RVR.  I thoroughly explained the risks and benefits of leaving the emergency department and not following these recommendations and the patient is adamant that she would like to go home and follow-up with her primary doctor.  She was given strict return precautions of which she and her husband both voiced understanding and she was discharged to be informed discharged and shared decision making with very strict return precautions.      Teressa Lower, MD 01/20/22 2014

## 2022-01-20 NOTE — ED Triage Notes (Signed)
Pt bib ems from home pt reports RUQ pain, tested positive for covid at home yesterday.

## 2022-01-21 ENCOUNTER — Ambulatory Visit: Payer: Medicare Other

## 2022-01-22 LAB — URINE CULTURE

## 2022-01-26 ENCOUNTER — Ambulatory Visit: Payer: Medicare Other | Admitting: Internal Medicine

## 2022-01-28 ENCOUNTER — Ambulatory Visit (INDEPENDENT_AMBULATORY_CARE_PROVIDER_SITE_OTHER): Payer: Medicare Other | Admitting: Internal Medicine

## 2022-01-28 ENCOUNTER — Encounter: Payer: Self-pay | Admitting: Internal Medicine

## 2022-01-28 VITALS — BP 132/84 | HR 63 | Temp 98.0°F | Resp 18 | Ht 69.0 in | Wt 171.0 lb

## 2022-01-28 DIAGNOSIS — Z794 Long term (current) use of insulin: Secondary | ICD-10-CM

## 2022-01-28 DIAGNOSIS — E876 Hypokalemia: Secondary | ICD-10-CM

## 2022-01-28 DIAGNOSIS — E1122 Type 2 diabetes mellitus with diabetic chronic kidney disease: Secondary | ICD-10-CM | POA: Diagnosis not present

## 2022-01-28 DIAGNOSIS — I2510 Atherosclerotic heart disease of native coronary artery without angina pectoris: Secondary | ICD-10-CM

## 2022-01-28 DIAGNOSIS — N1832 Chronic kidney disease, stage 3b: Secondary | ICD-10-CM

## 2022-01-28 DIAGNOSIS — R9341 Abnormal radiologic findings on diagnostic imaging of renal pelvis, ureter, or bladder: Secondary | ICD-10-CM

## 2022-01-28 DIAGNOSIS — Z9861 Coronary angioplasty status: Secondary | ICD-10-CM | POA: Diagnosis not present

## 2022-01-28 NOTE — Patient Instructions (Addendum)
  We are referring you to the endocrinologist for diabetes We are referring you to the urologist at [facility].  Diabetes: You can check your sugars at different times, they right times to do it are: - early in AM fasting  ( blood sugar goal 70-130) - 2 hours after a meal (blood sugar goal less than 180)     GO TO THE LAB : Get the blood work     [place], [place] Come back for a checkup in 3 months  Vaccines I recommend:  Covid booster RSV vaccine Shingrix (shingles)     Per our records you are due for your diabetic eye exam. Please contact your eye doctor to schedule an appointment. Please have them send copies of your office visit notes to [place]. Our fax number is [phone]. If you need a referral to an eye doctor please let us know.

## 2022-01-28 NOTE — Progress Notes (Signed)
Subjective:    Patient ID: [name] [name], female    DOB: April 12, 1940, 81 y.o.   MRN: [id number]  DOS:  01/28/2022 Type of visit - description: ER follow-up  Went to the ER 01/20/2022. Husband was dx w/  COVID few days before.  Presents with symptoms for several days including generalized fatigue, evidence of UTI in urinalysis, was on atrial fibrillation with elevated heart rate. Was recommended admission, patient declined . Work-up : EKG: Atrial fibrillation. Potassium 3.2, creatinine 1.3 (at baseline), CBC okay, troponin negative, COVID test negative. Chest x-ray no acute,  UA with evidence of UTI, treated with antibiotics for possible cystitis, urine culture came back contaminated.  CT abdomen and pelvis: EXAM: IMPRESSION: 1. Bilateral urothelial thickening with bilateral mild hydroureter as well as irregular urinary bladder wall thickening and perivesicular fat stranding. Correlate with urinalysis for infection. Given irregularity of the urinary bladder wall thickening-concern for underlying malignancy, recommend direct visualization and urologic consultation. 2. Nonobstructive bilateral nephrolithiasis. 3. Cholelithiasis with no CT evidence of acute cholecystitis. 4. Colonic diverticulosis with no acute diverticulitis. 5. Tiny hiatal hernia. 6. Interval increase in size of a pancreatic tail fluid dense lesion measuring 1.3 (from 1.1 cm). Stable in size 1.4 cm distal pancreatic body fluid dense lesion. Similar-appearing irregular appearance of the pancreatic uncinate process and head with likely underlying fluid lesions. No new lesion. Recommend follow-up MR pancreatic protocol 1 year.     Review of Systems Since she left the ER, reports she is "back to normal". Denies any fever or chills Denies dysuria, gross hematuria or difficulty urinating. Appetite is good and normal.  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   HYPERLIPIDEMIA 03/27/2006   HYPERTENSION 03/27/2006   Intertrochanteric fracture of right hip ([place]) 07/13/2012   LUNG NODULE 09/01/2006   Excision, Bx Benighn   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"   Osteopenia 2004   Dexa 2004 showed Osteopenia, DEXA 03/2007 normal   Osteopenia    Other chronic cystitis with hematuria    Pelvic fracture ([place]) 06/16/2016   S/P fall   Recurrent urinary tract infection    Seeing Urology   RETINOPATHY, 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   Seizures ([place])    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[place]) 3/78/5885    Past Surgical History:  Procedure Laterality Date   ANKLE FRACTURE SURGERY Bilateral 08/2015   "right one was in 2 places"   CARDIAC CATHETERIZATION N/A 05/17/2015   Procedure: Left Heart Cath and Coronary Angiography;  Surgeon: Jettie Booze, MD;  Location: [place] [facility];  Service: Cardiovascular;  Laterality: N/A;   CARDIAC CATHETERIZATION N/A 05/17/2015   Procedure: Coronary Balloon Angioplasty;  Surgeon: Jettie Booze, MD;  Location: [place] [facility];  Service: Cardiovascular;  Laterality: N/A;   FEMUR IM NAIL Right 07/15/2012   Procedure: INTRAMEDULLARY (IM) NAIL HIP;  Surgeon: Johnny Bridge, MD;  Location: [facility];  Service: Orthopedics;  Laterality: Right;   FEMUR IM NAIL Left  12/02/2015   Procedure: INTRAMEDULLARY (IM) NAIL FEMORAL;  Surgeon: [name], MD;  Location: [facility];  Service: Orthopedics;  Laterality: Left;   FRACTURE SURGERY     INCISION AND DRAINAGE HIP Left 01/03/2016   Procedure: IRRIGATION AND DEBRIDEMENT HIP;  Surgeon:  [name], MD;  Location: [facility];  Service: Orthopedics;  Laterality: Left;   SHOULDER SURGERY Bilateral    "don't remember which side"    Current Outpatient Medications  Medication Instructions   alendronate (FOSAMAX) 35 MG tablet TAKE 1 TABLET BY MOUTH ONCE A WEEK IN THE MORNING WITH A FULL GLASS OF WATER, ON AN EMPTY STOMACH   atorvastatin (LIPITOR) 80 mg, Oral, Daily   ezetimibe (ZETIA) 10 mg, Oral, Daily   furosemide (LASIX) 40 MG tablet TAKE 1 TABLET BY MOUTH TWICE DAILY AS NEEDED FOR FLUID   insulin NPH-regular Human (NOVOLIN 70/30 RELION) (70-30) 100 UNIT/ML injection 26 units with breakfast and 2 units with the evening meal.   metoprolol succinate (TOPROL-XL) 50 MG 24 hr tablet TAKE 1 TABLET BY MOUTH ONCE DAILY . APPOINTMENT REQUIRED FOR FUTURE REFILLS   Multiple Vitamins-Minerals (MULTIVITAMIN ADULT PO) 1 tablet, Oral, Daily with breakfast   VITAMIN D, CHOLECALCIFEROL, PO Oral   warfarin (COUMADIN) 2.5 MG tablet TAKE 1/2 TO 1 TABLET BY MOUTH ONCE DAILY AS DIRECTED BY  COUMADIN  CLINIC       Objective:   Physical Exam BP 132/84   Pulse 63   Temp 98 F (36.7 C) (Oral)   Resp 18   Ht 5\' 9"  (1.753 m)   Wt 171 lb (77.6 kg)   SpO2 99%   BMI 25.25 kg/m  General:   Well developed, NAD, BMI noted.  HEENT:  Normocephalic . Face symmetric, atraumatic Lungs:  CTA B Normal respiratory effort, no intercostal retractions, no accessory muscle use. Heart: Seems regular today  abdomen:  Abdomen was examined it while the patient was sit down in the chair, soft, nontender.   Skin: Not pale. Not jaundice Lower extremities: no pretibial edema bilaterally  Neurologic:  alert & oriented X3.  Speech normal, gait assisted by a walker  Psych--  Cognition and judgment appear intact.  Cooperative with normal attention span and concentration.  Behavior appropriate. No anxious or depressed appearing.     Assessment   Assessment   DM  + retinopathy, Dr [name] HTN CKD: Elevated  creatinine(started after admission 05-2015, had IV contrast ( Creatinine 1.5 = GFR ~35 ) Hyperlipidemia anxiety ativan GERD Anemia, normal iron 05-2015, never had a colonoscopy CV ---NSTEMI 05-2015, cath, angiplasty ---Echo, EF 40 % (05-2015) ---Paroxysmal Atrial Fibrillation/ Atrial Flutter s/p Ablation / History of SVT  on coumadin  GU Urinary retention /UTI 05-2015, had a foley temporarily, saw urology, PVR 249 cc (high), rx timed voiding Neuro: -Herpes encephalitis 2014 -Seizures (after encephalitis), sz episode 08-2015 (?) - HAs-dizzines - encephalomalacia per CT  Blindness, left eye Right eye weakness after cataract surgery Osteoporosis:   DEXA 2004, DEXA 2009 normal,  dexa 2015 @ [facility] -1.8.   T score -1.8 in 2018.   Hip fracture 2017;L1 vertebral fracture 2020 >>> Rx  alendronate 07/2018 H/o lung bx 2008 benign  Abnormal CT pancreas 12-2021: Rx MRI in 1 year  PLAN: ER follow-up Fatigue, COVID, abdominal pain with abnormal CT abdomen: See summary of ER visit above. She reports she is "back to normal". Vital signs stable, heart rate 63 and regular. We will recheck a BMP as potassium was low at the  ER. Abnormal pancreatic CT: They recommend MRI pancreas in 1 year Abnormal urinary bladder on CT: She used to go to urology for recurrent UTIs, last visit 2017.  We will refer the patient back to them. Diabetes: Last A1c 7.02 June 2021, ambulatory CBGs in the mornings in the 120s.  Referred to Endo, used to see Dr. [name]. Coumadin management: Last INR 2.5 few days ago.  Follow-up by the Coumadin clinic. RTC 3 months

## 2022-01-28 NOTE — Assessment & Plan Note (Signed)
ER follow-up Fatigue, COVID, abdominal pain with abnormal CT abdomen: See summary of ER visit above. She reports she is "back to normal". Vital signs stable, heart rate 63 and regular. We will recheck a BMP as potassium was low at the ER. Abnormal pancreatic CT: They recommend MRI pancreas in 1 year Abnormal urinary bladder on CT: She used to go to urology for recurrent UTIs, last visit 2017.  We will refer the patient back to them. Diabetes: Last A1c 7.02 June 2021, ambulatory CBGs in the mornings in the 120s.  Referred to Endo, used to see Dr. [name]. Coumadin management: Last INR 2.5 few days ago.  Follow-up by the Coumadin clinic. RTC 3 months

## 2022-02-03 ENCOUNTER — Ambulatory Visit: Payer: Medicare Other

## 2022-02-05 ENCOUNTER — Ambulatory Visit: Payer: Medicare Other | Attending: Cardiovascular Disease | Admitting: *Deleted

## 2022-02-05 DIAGNOSIS — Z5181 Encounter for therapeutic drug level monitoring: Secondary | ICD-10-CM

## 2022-02-05 DIAGNOSIS — I4891 Unspecified atrial fibrillation: Secondary | ICD-10-CM

## 2022-02-05 LAB — POCT INR: INR: 4.3 — AB (ref 2.0–3.0)

## 2022-02-05 NOTE — Patient Instructions (Signed)
Description   Do not take any warfarin today and tomorrow take 1/2 tablet then continue taking warfarin 1/2 a tablet daily except 1 tablet on Monday, Wednesday and Friday. Stay consistent with leafy veggies to your diet. Recheck INR in 3 weeks. [facility]in Clinic [phone].

## 2022-02-06 ENCOUNTER — Other Ambulatory Visit: Payer: Self-pay | Admitting: Internal Medicine

## 2022-02-11 NOTE — Telephone Encounter (Signed)
Forwarding to rx prior auth team.  

## 2022-02-12 NOTE — Telephone Encounter (Signed)
Prolia VOB initiated via [email or web address] 

## 2022-02-27 ENCOUNTER — Ambulatory Visit: Payer: Medicare Other | Attending: Cardiovascular Disease | Admitting: *Deleted

## 2022-02-27 DIAGNOSIS — I4891 Unspecified atrial fibrillation: Secondary | ICD-10-CM

## 2022-02-27 DIAGNOSIS — Z5181 Encounter for therapeutic drug level monitoring: Secondary | ICD-10-CM | POA: Diagnosis not present

## 2022-02-27 LAB — POCT INR: INR: 2.5 (ref 2.0–3.0)

## 2022-02-27 NOTE — Patient Instructions (Signed)
Continue taking warfarin 1/2 a tablet daily except 1 tablet on Monday, Wednesday and Friday. Stay consistent with leafy veggies to your diet. Recheck INR in 4 weeks. Coumadin Clinic [phone].

## 2022-03-05 NOTE — Telephone Encounter (Signed)
Pt ready for scheduling on or after 03/23/22  Out-of-pocket cost due at time of visit: $0  Primary: [employer] Prolia co-insurance: 0% Admin fee co-insurance: 0%  Secondary: [employer] [place] Prolia co-insurance:  Admin fee co-insurance:   Deductible: $0 met of $240 required  Prior Auth:  PA# Valid:   ** This summary of benefits is an estimation of the patient's out-of-pocket cost. Exact cost may vary based on individual plan coverage.

## 2022-03-20 NOTE — Telephone Encounter (Signed)
Pt scheduled for 03/24/22 for injection.

## 2022-03-24 ENCOUNTER — Ambulatory Visit (INDEPENDENT_AMBULATORY_CARE_PROVIDER_SITE_OTHER): Payer: Medicare Other | Admitting: *Deleted

## 2022-03-24 ENCOUNTER — Telehealth: Payer: Self-pay | Admitting: *Deleted

## 2022-03-24 DIAGNOSIS — M8000XD Age-related osteoporosis with current pathological fracture, unspecified site, subsequent encounter for fracture with routine healing: Secondary | ICD-10-CM

## 2022-03-24 MED ORDER — DENOSUMAB 60 MG/ML ~~LOC~~ SOSY
60.0000 mg | PREFILLED_SYRINGE | Freq: Once | SUBCUTANEOUS | Status: AC
  Administered 2022-03-24: 60 mg via SUBCUTANEOUS

## 2022-03-24 NOTE — Telephone Encounter (Signed)
-----  Message from Doylene Canning, [place] sent at 03/24/2022 10:09 AM EST ----- Prolia given 03/24/22.

## 2022-03-24 NOTE — Progress Notes (Signed)
Patient here for prolia injection per physicians orders.  Injection given left subq and patient tolerated well.  

## 2022-03-27 ENCOUNTER — Ambulatory Visit: Payer: Medicare Other

## 2022-03-30 ENCOUNTER — Ambulatory Visit: Payer: Medicare Other | Attending: Cardiovascular Disease

## 2022-03-30 DIAGNOSIS — I4891 Unspecified atrial fibrillation: Secondary | ICD-10-CM

## 2022-03-30 DIAGNOSIS — Z5181 Encounter for therapeutic drug level monitoring: Secondary | ICD-10-CM | POA: Diagnosis not present

## 2022-03-30 LAB — POCT INR: INR: 2 (ref 2.0–3.0)

## 2022-03-30 NOTE — Patient Instructions (Signed)
Description   Take 1.5 tablets today and then continue taking warfarin 1/2 a tablet daily except 1 tablet on Monday, Wednesday and Friday.  Stay consistent with leafy veggies to your diet.  Recheck INR in 5 weeks.  [facility] Clinic [phone].

## 2022-04-20 ENCOUNTER — Ambulatory Visit: Payer: Medicare Other | Admitting: Neurology

## 2022-04-21 ENCOUNTER — Other Ambulatory Visit: Payer: Self-pay

## 2022-04-21 DIAGNOSIS — I480 Paroxysmal atrial fibrillation: Secondary | ICD-10-CM

## 2022-04-21 MED ORDER — WARFARIN SODIUM 2.5 MG PO TABS: ORAL_TABLET | ORAL | 0 refills | Status: DC

## 2022-04-21 NOTE — Telephone Encounter (Signed)
Prescription refill request received for warfarin Lov: [name] [name],  Next INR check: 3/4 Warfarin tablet strength: 2.20m

## 2022-05-04 ENCOUNTER — Ambulatory Visit: Payer: Medicare Other | Attending: Cardiovascular Disease | Admitting: *Deleted

## 2022-05-04 DIAGNOSIS — I4891 Unspecified atrial fibrillation: Secondary | ICD-10-CM | POA: Insufficient documentation

## 2022-05-04 DIAGNOSIS — Z5181 Encounter for therapeutic drug level monitoring: Secondary | ICD-10-CM | POA: Diagnosis present

## 2022-05-04 LAB — POCT INR: INR: 2.7 (ref 2.0–3.0)

## 2022-05-04 NOTE — Patient Instructions (Signed)
Description   Continue taking warfarin 1/2 a tablet daily except 1 tablet on Monday, Wednesday and Friday. Stay consistent with leafy veggies to your diet.  Recheck INR in 6 weeks.  C[facility]in Clinic [phone].

## 2022-05-05 ENCOUNTER — Ambulatory Visit (INDEPENDENT_AMBULATORY_CARE_PROVIDER_SITE_OTHER): Payer: Medicare Other | Admitting: Internal Medicine

## 2022-05-05 ENCOUNTER — Encounter: Payer: Self-pay | Admitting: Internal Medicine

## 2022-05-05 VITALS — BP 120/60 | HR 62 | Temp 97.9°F | Resp 12 | Ht 69.0 in | Wt 166.0 lb

## 2022-05-05 DIAGNOSIS — Z794 Long term (current) use of insulin: Secondary | ICD-10-CM | POA: Diagnosis not present

## 2022-05-05 DIAGNOSIS — N1832 Chronic kidney disease, stage 3b: Secondary | ICD-10-CM | POA: Diagnosis not present

## 2022-05-05 DIAGNOSIS — N189 Chronic kidney disease, unspecified: Secondary | ICD-10-CM

## 2022-05-05 DIAGNOSIS — I10 Essential (primary) hypertension: Secondary | ICD-10-CM

## 2022-05-05 DIAGNOSIS — E1122 Type 2 diabetes mellitus with diabetic chronic kidney disease: Secondary | ICD-10-CM | POA: Diagnosis not present

## 2022-05-05 DIAGNOSIS — N390 Urinary tract infection, site not specified: Secondary | ICD-10-CM

## 2022-05-05 LAB — BASIC METABOLIC PANEL
BUN: 33 mg/dL — ABNORMAL HIGH (ref 6–23)
CO2: 31 mEq/L (ref 19–32)
Calcium: 9.4 mg/dL (ref 8.4–10.5)
Chloride: 98 mEq/L (ref 96–112)
Creatinine, Ser: 1.88 mg/dL — ABNORMAL HIGH (ref 0.40–1.20)
GFR: 24.97 mL/min — ABNORMAL LOW (ref >60.00)
Glucose, Bld: 147 mg/dL — ABNORMAL HIGH (ref 70–99)
Potassium: 4.4 mEq/L (ref 3.5–5.1)
Sodium: 139 mEq/L (ref 135–145)

## 2022-05-05 LAB — HEMOGLOBIN A1C: Hgb A1c MFr Bld: 9 % — ABNORMAL HIGH (ref 4.6–6.5)

## 2022-05-05 NOTE — Assessment & Plan Note (Signed)
DM: To see endocrinology soon, ambulatory CBGs ranged from 90-120.  Denies low sugar symptoms. HTN: BP seems well-controlled, check a BMP. High cholesterol: Due for FLP.  Continue same medications. Abdominal pain: See LOV, resolved. Abnormal urinary bladder on CT: Has not seen urology just yet.  Developing UTI symptoms, check UA urine culture.  Further advised for results. Preventive care: Declined PNM 20. RTC 4 months

## 2022-05-05 NOTE — Addendum Note (Signed)
Addended by: Manuela Schwartz on: 05/05/2022 04:55 PM   Modules accepted: Orders

## 2022-05-05 NOTE — Patient Instructions (Addendum)
Vaccines I recommend:  Covid booster Shingrix (shingles) RSV vaccine  Check the  blood pressure regularly BP GOAL is between 110/65 and  135/85. If it is consistently higher or lower, let me know      GO TO THE LAB : Get the blood work     [place], [place] back for  a check up in 4 months     Per our records you are due for your diabetic eye exam. Please contact your eye doctor to schedule an appointment. Please have them send copies of your office visit notes to [place]. Our fax number is [phone]. If you need a referral to an eye doctor please let us know.

## 2022-05-05 NOTE — Progress Notes (Signed)
Subjective:    Patient ID: [name] [name], female    DOB: 06-Jan-1941, 82 y.o.   MRN: [id number]  DOS:  05/05/2022 Type of visit - description: Follow-up, here with her husband  In general feels well. Ambulatory blood sugars look good. Thinks she is getting a UTI, for the last few days has noticed some urinary frequency and urgency.  No dysuria.  No gross hematuria. No fever or chills. 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   HYPERLIPIDEMIA 03/27/2006   HYPERTENSION 03/27/2006   Intertrochanteric fracture of right hip ([place]) 07/13/2012   LUNG NODULE 09/01/2006   Excision, Bx [facility]   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"   Osteopenia 2004   Dexa 2004 showed Osteopenia, DEXA 03/2007 normal   Osteopenia    Other chronic cystitis with hematuria    Pelvic fracture ([place]) 06/16/2016   S/P fall   Recurrent urinary tract infection    Seeing Urology   RETINOPATHY, 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/27/2006   Seizures ([place])    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[place]) [id number]    Past Surgical History:  Procedure Laterality Date   ANKLE FRACTURE SURGERY Bilateral 08/2015   "right one was in 2 places"   CARDIAC CATHETERIZATION N/A 05/17/2015   Procedure: Left Heart Cath and Coronary Angiography;  Surgeon: Jettie Booze, MD;  Location: [facility];  Service: Cardiovascular;  Laterality: N/A;   CARDIAC CATHETERIZATION N/A 05/17/2015   Procedure: Coronary Balloon Angioplasty;  Surgeon: Jettie Booze, MD;  Location: [facility];  Service: Cardiovascular;  Laterality: N/A;   FEMUR IM NAIL Right 07/15/2012   Procedure: INTRAMEDULLARY (IM) NAIL HIP;  Surgeon: Johnny Bridge, MD;  Location: [facility];  Service: Orthopedics;  Laterality: Right;   FEMUR IM NAIL Left 12/02/2015   Procedure: INTRAMEDULLARY (IM) NAIL FEMORAL;  Surgeon: [name], MD;  Location: [facility];  Service: Orthopedics;  Laterality: Left;   FRACTURE SURGERY     INCISION AND DRAINAGE HIP Left 01/03/2016   Procedure: IRRIGATION AND DEBRIDEMENT HIP;  Surgeon: [name], MD;  Location: [facility];  Service: Orthopedics;  Laterality: Left;   SHOULDER SURGERY Bilateral    "don't remember which side"    Current Outpatient Medications  Medication Instructions   atorvastatin (LIPITOR) 80 mg, Oral, Daily   denosumab (PROLIA) 60 mg, Subcutaneous, Every 6 months   ezetimibe (ZETIA) 10 mg, Oral, Daily   furosemide (LASIX) 40 MG tablet TAKE 1 TABLET BY MOUTH TWICE DAILY AS NEEDED FOR FLUID   insulin NPH-regular Human (NOVOLIN 70/30 RELION) (70-30) 100 UNIT/ML injection 26 units with breakfast and 2 units with the evening meal.   metoprolol succinate (TOPROL-XL) 50 MG 24 hr tablet TAKE 1 TABLET BY MOUTH ONCE DAILY . APPOINTMENT REQUIRED FOR FUTURE REFILLS   Multiple Vitamins-Minerals (MULTIVITAMIN ADULT PO) 1 tablet, Oral, Daily with breakfast   VITAMIN D, CHOLECALCIFEROL, PO Oral  warfarin (COUMADIN) 2.5 MG tablet TAKE 1/2 TO 1 TABLET BY MOUTH ONCE DAILY AS DIRECTED BY  COUMADIN  CLINIC       Objective:   Physical Exam BP 120/60 (BP Location: Left Arm, Cuff Size: Large)   Pulse 62   Temp 97.9 F (36.6 C) (Oral)   Resp 12   Ht '5\' 9"'$  (1.753 m)   Wt 166 lb (75.3 kg)   SpO2 96%   BMI 24.51 kg/m  General:   Well developed, NAD, BMI noted. HEENT:  Normocephalic . Face symmetric, atraumatic Lungs:  CTA B Normal respiratory effort, no intercostal retractions, no accessory muscle use. Heart: Seems regular  today. Abdomen: Soft, nontender Lower extremities: no pretibial edema bilaterally  Skin: Not pale. Not jaundice Neurologic:  alert & oriented X3.  Speech normal, gait uses a walker Psych--  Cognition and judgment appear intact.  Cooperative with normal attention span and concentration.  Behavior appropriate. No anxious or depressed appearing.      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   T score -1.8 in 2018.   Hip fracture 2017;L1 vertebral fracture 2020 >>> Rx  alendronate 07/2018 H/o lung bx 2008 benign  Abnormal CT pancreas 12-2021: Rx MRI in 1 year  PLAN: DM: To see endocrinology soon, ambulatory CBGs ranged from 90-120.  Denies low sugar symptoms. HTN: BP seems well-controlled, check a BMP. High cholesterol: Due for FLP.  Continue same medications. Abdominal pain: See LOV, resolved. Abnormal urinary bladder on CT: Has not seen urology just yet.  Developing UTI symptoms, check UA urine culture.  Further advised for results. Preventive care: Declined PNM 20. RTC 4 months

## 2022-05-06 ENCOUNTER — Other Ambulatory Visit (INDEPENDENT_AMBULATORY_CARE_PROVIDER_SITE_OTHER): Payer: Medicare Other

## 2022-05-06 DIAGNOSIS — N390 Urinary tract infection, site not specified: Secondary | ICD-10-CM

## 2022-05-06 LAB — URINALYSIS, ROUTINE W REFLEX MICROSCOPIC
Bilirubin Urine: NEGATIVE
Ketones, ur: NEGATIVE
Nitrite: NEGATIVE
Specific Gravity, Urine: 1.015 (ref 1.000–1.030)
Total Protein, Urine: 100 — AB
Urine Glucose: NEGATIVE
Urobilinogen, UA: 0.2 (ref 0.0–1.0)
pH: 8 (ref 5.0–8.0)

## 2022-05-07 LAB — URINE CULTURE
MICRO NUMBER:: 14656777
SPECIMEN QUALITY:: ADEQUATE

## 2022-05-07 NOTE — Addendum Note (Signed)
Addended byDamita Dunnings D on: 05/07/2022 12:16 PM   Modules accepted: Orders

## 2022-05-18 ENCOUNTER — Ambulatory Visit (INDEPENDENT_AMBULATORY_CARE_PROVIDER_SITE_OTHER): Payer: Medicare Other | Admitting: Neurology

## 2022-05-18 ENCOUNTER — Encounter: Payer: Self-pay | Admitting: Neurology

## 2022-05-18 VITALS — BP 144/62 | HR 66 | Ht 68.0 in | Wt 164.0 lb

## 2022-05-18 DIAGNOSIS — Z8673 Personal history of transient ischemic attack (TIA), and cerebral infarction without residual deficits: Secondary | ICD-10-CM

## 2022-05-18 DIAGNOSIS — R413 Other amnesia: Secondary | ICD-10-CM

## 2022-05-18 DIAGNOSIS — G3184 Mild cognitive impairment, so stated: Secondary | ICD-10-CM | POA: Diagnosis not present

## 2022-05-18 NOTE — Patient Instructions (Addendum)
I had a long discussion with the patient and her husband regarding her memory loss and mild cognitive impairment which appears to be stable.  I recommend she continue participation in cognitively challenging activities like solving crossword puzzles, playing bridge and sudoku.  We also discussed memory compensation strategies.  Continue warfarin for stroke prevention given her atrial fibrillation and maintain aggressive risk factor modification.  She will return for follow-up visit in 6 months with my nurse practitioner or call earlier if necessary. Memory Compensation Strategies  Use "WARM" strategy.  W= write it down  A= associate it  R= repeat it  M= make a mental note  2.   You can keep a Social worker.  Use a 3-ring notebook with sections for the following: calendar, important names and phone numbers,  medications, doctors' names/phone numbers, lists/reminders, and a section to journal what you did  each day.   3.    Use a calendar to write appointments down.  4.    Write yourself a schedule for the day.  This can be placed on the calendar or in a separate section of the Memory Notebook.  Keeping a  regular schedule can help memory.  5.    Use medication organizer with sections for each day or morning/evening pills.  You may need help loading it  6.    Keep a basket, or pegboard by the door.  Place items that you need to take out with you in the basket or on the pegboard.  You may also want to  include a message board for reminders.  7.    Use sticky notes.  Place sticky notes with reminders in a place where the task is performed.  For example: " turn off the  stove" placed by the stove, "lock the door" placed on the door at eye level, " take your medications" on  the bathroom mirror or by the place where you normally take your medications.  8.    Use alarms/timers.  Use while cooking to remind yourself to check on food or as a reminder to take your medicine, or as a  reminder to  make a call, or as a reminder to perform another task, etc. er husband regarding her mild memory loss and cognitive impairment which appears to be stable.  I encouraged her to continue to increase participation in cognitively challenging activities like solving) puzzles, playing bridge and sudoku.  We also discussed memory compensation strategies.  Continue warfarin for stroke prevention given history of atrial fibrillation and maintain aggressive risk factor modification.  She will return for follow-up in the future in 6 months or call earlier if necessary.

## 2022-05-18 NOTE — Progress Notes (Unsigned)
PATIENT: [name] [name] DOB: 1941-01-29 Referring Physician : [employer] VISIT: New episodes of abnormal behavior  HISTORY FROM: patient and husband and review of chart  HISTORY OF PRESENT ILLNESS: [name] [name] is a 82 year old pleasant Caucasian lady seen today for office consultation visit.  She is accompanied by her husband.  History is obtained from both of them and review of electronic medical records.  I personally reviewed pertinent available imaging films in PACS.: Patient is seen for follow-up today after her last visit 5 and half years ago.  She is accompanied by her husband.  She is here mainly to see me because of new spells she has been having for the last year or so.  She described these coming on suddenly without warning.  She becomes very irritable and angry and often starts speaking in loud tone and voice and husband feels she is trying to yell at him.  This lasts for variable period of time from a few hours to several hours.  She after this episode she seems apologetic for her behavior.  She is fully awake and aware during the episodes.  She denies any altered consciousness, staring, tonic-clonic activity or involuntary movements.  These may often be triggered by argument.  Has not had any progressive memory decline.  She denies any seizure-like activity.  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  She was last seen in my office in 2017 at that time she was on seizure medications but they apparently have been discontinued and she states she has had not not had any breakthrough seizures.  The current episodes are not similar to the previous seizures that she had had.  Patient still has trouble remembering mostly telephone numbers but she can pay her own bills and handle her finances and is independent in activities of daily living.  Update 07/14/13 (LL):  Since last visit, has  struggled with hip pain since surgery, denies falls, ambulates with a cane.  No recurrent seizures, states taking Dilantin 100 ER QID but level last month was only 3.5, she takes 2 in the morning, 1 in the afternoon and 1 at bedtime.  Right hand tremor is stable. MMSE is stable at 26/30.  Update 09/27/12 (LL): Patient comes in for 6 month follow up for seizures. Doing well, had fractured right hip recently from fall in yard, status post surgery 07/15/12. Released from rehab to home 3 weeks ago. Ambulating with cane, doing well. No further seizures since initial episode. She is on Dilantin 150 mg 3 times a day and seems to the tolerating it well without any dizziness, imbalance, diplopia or dysarthria. Thinks that memory is stable, only notable trouble is remembering numbers. Still has mild tremor of the right hand, reports it is better with Ativan. No new complaints.   Update January 21st 2014 (PS): She returns for followup after last visit on 12/11/2011. She continues to do well without any recurrent seizures following the one during her initial hospitalization in March 2013. She is on Dilantin 150 mg 3 times a day and seems to the tolerating it well without any dizziness, imbalance, diplopia or dysarthria. She continues to have mild short-term memory difficulties which are mainly related to her new memory. She can remember remote incidents quite well. She also complains of mild action tremor of the right hand but it has not been disabling.  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 She also has mild diabetic peripheral neuropathy. Overall doing well. One syncopal episode related to hypoglycemia.  Update 11/14/13 : She returns for followup of her last visit 4 months ago. She continues to do well without recurrent seizures since the initial one in  March 2013. She is currently on Vantin 400 mg daily and seems to be covering it very well. She does have mild tremor of the left hand but denies dizziness, diplopia or vertigo sleepiness. She continues to have mild short-term memory difficulties but she seems to be coping quite well with them and these are not progressive. In fact she is still balancing checkbooks and writing checks and not making any mistakes. She has no new complaints today at Update 05/17/2014 : She returns for follow-up after last visit 6 months ago. Patient continues to do well without recurrent seizures. She has tolerated decrease in the Dilantin to 300 mg daily. She has been depressed and upset by her son's activities. He is been arrested for house breaking and is in jail. She has short-term memory difficulties and needs help even with cooking. She has never been tried on medications for depression and she is willing to try treatment. Update 11/28/2014 : She returns for follow-up after last visit 6 months ago accompanied by her husband. She has not had any recurrent seizures and continues to tolerate Dilantin 300 mg at night quite well without significant side effects. She continues to have depression despite being on Lexapro 10 mg and this is situational related to his son's activities is currently in rehabilitation and may have to go to jail. She is tolerating Lexapro well without any side effects. She does have upcoming visit with her primary physician next month. She has a lot of itching in her hands and is scratching her forearms. and this may be stress related Update 05/30/2015 ;  Patient continues to well from seizure standpoint has not had a seizure now for a couple of years. She remains on Dilantin 300 mg a day and is tolerating it well without side effects However she was hospitalized a few times last month. She was admitted with MI, systolic heart failure with ischemic cardiomyopathy and had a cardiac cath on 05/17/15 and underwent  angioplasty. She had another admission for UTI with enterococcus and abdominal pain CT abdomen showed emphysematous cystitis and she had a urology and ID consult. He was treated with antibiotics and has been gradual improvement. She continues to walk with a wheeled walker for long distances. She has had no fortunately falls. She has no other complaints today. Update 12/24/2015 : She returns for follow-up after last visit 6 months ago. She is accompanied by husband. Patient has the unfortunate fall off 3-4 weeks ago and had hip fracture. She underwent surgery by Dr. Lorin Mercy. She is getting therapy and is currently in a significant rehabilitation facility. Patient had recent lab work done on 12/04/15 with phenytoin level being 3.4 total and free fentanyl been nondetectable. Patient states she's been compliant with her medications. She did have a questionable seizure back in July but in the setting of a bladder infection and level at that time on 7/15/17was 2.8 in Dilantin dose was increased from 300 to 400 mg daily.    Update 10/09/2021 : She returns for follow-up after last visit 3 and half months ago.  She is accompanied by  her husband.  She states her memory difficulties and cognitive impairment appears more or less unchanged.  She has good days and bad days.  She cannot remember things that may come back later.  I have advised an MRI scan of the brain at last visit but for unclear reason it has not been done.  Lab work on 06/17/2021 showed normal vitamin B12, TSH, RPR and levels.  EEG on 07/01/21 was normal.  He had a CT scan of the head on 08/31/2021 which showed encephalomalacia and left remains.  Continues to ambulate with a wheeled walker and has not had any falls or injuries.  She remains on warfarin which she is tolerating well without bruising or bleeding.  Blood pressure is usually under good control but today it is elevated at 173/76.  He is tolerating Lipitor well without muscle aches and pains.  Update  05/18/2022 : She returns for follow-up after last visit 6 months ago.  Accompanied by husband.  They are.  She is doing about the same.  She continues to have mild memory difficulties particularly with numbers.  However she is quite independent in all activities of daily living.  They have not noticed any significant ongoing progressive cognitive decline.  She has no new complaints today.  On MMSE testing today she scored 27/30 which is not significantly reduced results.  Remains on Coumadin for atrial fibrillation and states INR is slightly stable.  Her blood pressure is well-controlled today it is 140/62.  She is tolerating Lipitor well without side effects.  She states that sugars have also been under good control.  She has no new complaints today..  She has not had any seizures. REVIEW OF SYSTEMS: Full 14 system review of systems performed and notable only for:   , Episodes of anger and abnormal behavior   walking difficulty, ,   only and all other systems negative   ALLERGIES: Allergies  Allergen Reactions   Procaine Hcl Anaphylaxis   Amoxicillin Itching     Did it involve swelling of the face/tongue/throat, SOB, or low BP? No Did it involve sudden or severe rash/hives, skin peeling, or any reaction on the inside of your mouth or nose? No Did you need to seek medical attention at a hospital or doctor's office? No When did it last happen? unk If all above answers are "NO", may proceed with cephalosporin use.     HOME MEDICATIONS: Outpatient Medications Prior to Visit  Medication Sig Dispense Refill   atorvastatin (LIPITOR) 80 MG tablet Take 1 tablet by mouth once daily 90 tablet 1   ezetimibe (ZETIA) 10 MG tablet Take 1 tablet (10 mg total) by mouth daily. 90 tablet 2   furosemide (LASIX) 40 MG tablet TAKE 1 TABLET BY MOUTH TWICE DAILY AS NEEDED FOR FLUID 180 tablet 1   insulin NPH-regular Human (NOVOLIN 70/30 RELION) (70-30) 100 UNIT/ML injection 26 units with breakfast and 2 units with  the evening meal. 20 mL 3   metoprolol succinate (TOPROL-XL) 50 MG 24 hr tablet TAKE 1 TABLET BY MOUTH ONCE DAILY . APPOINTMENT REQUIRED FOR FUTURE REFILLS 90 tablet 1   Multiple Vitamins-Minerals (MULTIVITAMIN ADULT PO) Take 1 tablet by mouth daily with breakfast.      VITAMIN D, CHOLECALCIFEROL, PO Take by mouth.     warfarin (COUMADIN) 2.5 MG tablet TAKE 1/2 TO 1 TABLET BY MOUTH ONCE DAILY AS DIRECTED BY  COUMADIN  CLINIC 75 tablet 0   denosumab (PROLIA) 60 MG/ML SOSY injection Inject 60  mg into the skin every 6 (six) months. (Patient not taking: Reported on 05/18/2022)     No facility-administered medications prior to visit.     PHYSICAL EXAM  Vitals:   05/18/22 1554  BP: (!) 144/62  Pulse: 66  Weight: 164 lb (74.4 kg)  Height: 5\' 8"  (1.727 m)   Body mass index is 24.94 kg/m. No results found.   Generalized: pleasant elderly Caucasian female in no acute distress,    Neck: Supple, no carotid bruits  Cardiac: Regular rate rhythm, no murmur  Pulmonary: Clear to auscultation bilaterally  Musculoskeletal: No deformity  SKIN : no rash Neurological examination  Mentation: Alert oriented to time, place, history taking, language fluent, and casual conversation.  Recall 1/3.  Able to name only 10 animals which can walk on 4 legs.  clock drawing 4/4.  Difficulty in copying intersecting pentagons.  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 hearing was intact to finger rubbing bilaterally. Uvula tongue midline. head turning and shoulder shrug and were normal and symmetric.Tongue protrusion into cheek strength was normal.  MOTOR: normal bulk and tone, full strength in the BUE, decreased strength 4/5 hip flexion RLE (due to hip surgery) , LLE 5/5 strength. fine finger movements normal, no  pronator drift. Fine action and resting tremor of right hand. No cogwheel rigidity or bradykinesia.  SENSORY: normal and symmetric to light touch Vibration and position impaired over toes.  COORDINATION: finger-nose-finger, heel-to-shin bilaterally, there was no truncal ataxia  REFLEXES: Brachioradialis 2/2, biceps 2/2, triceps 2/2, patellar 1/1, Achilles trace/trace  GAIT/STATION: Uses a wheeled walker and favors the left hip.  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 She also has mild diabetic peripheral neuropathy.  She continues to do well with stable mild cognitive impairment. PLAN :I had a long discussion with the patient and her husband regarding her memory loss and mild cognitive impairment which appears to be stable.  I recommend she continue participation in cognitively challenging activities like solving crossword puzzles, playing bridge and sudoku.  We also discussed memory compensation strategies.  Continue warfarin for stroke prevention given her atrial fibrillation and maintain aggressive risk factor modification.  She will return for follow-up visit in 6 months with my nurse practitioner or call earlier if necessary. Greater than 50% time during this 40 minute visit was spent on counseling and coordination of care about her episodes of abnormal behavior,,  prevention and treatment She will return for follow-up in 6 months with my nurse practitioner or  call earlier if necessary.   No orders of the defined types were placed in this encounter.  No follow-ups on file.  Antony Contras, MD  05/18/2022, 4:24 PM [facility] [address] [place], [place] [address] [phone]  Note: This document was prepared with digital dictation and possible smart phrase technology. Any transcriptional errors that result from this process are unintentional.

## 2022-05-22 ENCOUNTER — Ambulatory Visit (INDEPENDENT_AMBULATORY_CARE_PROVIDER_SITE_OTHER): Payer: Medicare Other | Admitting: Internal Medicine

## 2022-05-22 ENCOUNTER — Encounter: Payer: Self-pay | Admitting: Internal Medicine

## 2022-05-22 VITALS — BP 124/72 | HR 72 | Ht 68.0 in | Wt 171.0 lb

## 2022-05-22 DIAGNOSIS — Z794 Long term (current) use of insulin: Secondary | ICD-10-CM | POA: Diagnosis not present

## 2022-05-22 DIAGNOSIS — E1159 Type 2 diabetes mellitus with other circulatory complications: Secondary | ICD-10-CM | POA: Diagnosis not present

## 2022-05-22 DIAGNOSIS — E1122 Type 2 diabetes mellitus with diabetic chronic kidney disease: Secondary | ICD-10-CM

## 2022-05-22 DIAGNOSIS — E1165 Type 2 diabetes mellitus with hyperglycemia: Secondary | ICD-10-CM

## 2022-05-22 DIAGNOSIS — N184 Chronic kidney disease, stage 4 (severe): Secondary | ICD-10-CM

## 2022-05-22 LAB — POCT GLUCOSE (DEVICE FOR HOME USE): POC Glucose: 343 mg/dl — AB (ref 70–99)

## 2022-05-22 NOTE — Patient Instructions (Signed)
Take Novolin mix 26 units Before Breakfast and 5 units at bedtime     HOW TO TREAT LOW BLOOD SUGARS (Blood sugar LESS THAN 70 MG/DL) Please follow the RULE OF 15 for the treatment of hypoglycemia treatment (when your (blood sugars are less than 70 mg/dL)   STEP 1: Take 15 grams of carbohydrates when your blood sugar is low, which includes:  3-4 GLUCOSE TABS  OR 3-4 OZ OF JUICE OR REGULAR SODA OR ONE TUBE OF GLUCOSE GEL    STEP 2: RECHECK blood sugar in 15 MINUTES STEP 3: If your blood sugar is still low at the 15 minute recheck --> then, go back to STEP 1 and treat AGAIN with another 15 grams of carbohydrates.

## 2022-05-22 NOTE — Progress Notes (Signed)
Name: [name] [name]  Age/ Sex: 82 y.o., female   MRN/ DOB: [id number], 13-Nov-1940     PCP: Colon Branch, MD   Reason for Endocrinology Evaluation: Type 2 Diabetes Mellitus  Initial Endocrine Consultative Visit: 01/19/2012    PATIENT IDENTIFIER: [name] [name] is a 82 y.o. female with a past medical history of DM, dyslipidemia, CAD, A-fib, HTN, CHF. The patient has followed with Endocrinology clinic since 01/19/2012 for consultative assistance with management of her diabetes.  DIABETIC HISTORY:  [name] [name] was diagnosed with DM 1992. Her hemoglobin A1c has ranged from 7.2% in 2021, peaking at 9.0% in 2024.   Per her previous endocrinologist note, the patient is on twice daily insulin mix due to noncompliance with multiple daily injections  She is also on human insulin due to cost  The patient was followed by Dr. [name] from 2013 until 05/2021 SUBJECTIVE:   During the last visit (06/18/2021): Saw Dr. [name]  Today (05/22/2022): [name] [name]  She checks her blood sugars 1 times daily. The patient has not had hypoglycemic episodes since the last clinic visit.   She endorses BG 93-148 mg/dL   Denies nausea, vomiting or diarrhea   HOME DIABETES REGIMEN:  Novolin mix 20 units QAM and 5 units QPM    Statin: Yes ACE-I/ARB: No   METER DOWNLOAD SUMMARY:    DIABETIC COMPLICATIONS: Microvascular complications:  Neuropathy, CKD Denies:  Last Eye Exam: Completed 11/2021  Macrovascular complications:  CAD Denies:  CVA, PVD   HISTORY:  Past Medical History:  Past Medical History:  Diagnosis Date   Atrial fibrillation ([place])    SVT dx 2007, cath 2007 mild CAD, then had an cardioversion, ablation; still on coumadin , has occ palpitation, EKG 03-2010 NSR   Blindness of left eye    CKD (chronic kidney disease), stage III ([place]) 06/16/2016   [name] 06/17/2016   DIABETES MELLITUS, TYPE II 03/27/2006   dr [name]   Eye muscle weakness    Right eye weakness after  cataract surgery   GERD (gastroesophageal reflux disease) 10/05/2011   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   HYPERLIPIDEMIA 03/27/2006   HYPERTENSION 03/27/2006   Intertrochanteric fracture of right hip ([place]) 07/13/2012   LUNG NODULE 09/01/2006   Excision, Bx Benighn   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"   Osteopenia 2004   Dexa 2004 showed Osteopenia, DEXA 03/2007 normal   Osteopenia    Other chronic cystitis with hematuria    Pelvic fracture ([place]) 06/16/2016   S/P fall   Recurrent urinary tract infection    Seeing Urology   RETINOPATHY, BACKGROUND NOS 03/27/2006   Seizures ([place])    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[place]) [id number]   Past Surgical History:  Past Surgical History:  Procedure Laterality Date   ANKLE FRACTURE SURGERY Bilateral 08/2015   "right one was in 2 places"   CARDIAC CATHETERIZATION N/A 05/17/2015   Procedure: Left Heart Cath and Coronary Angiography;  Surgeon: Jettie Booze, MD;  Location: [facility];  Service: Cardiovascular;  Laterality: N/A;   CARDIAC CATHETERIZATION N/A 05/17/2015   Procedure: Coronary Balloon Angioplasty;  Surgeon: Jettie Booze, MD;  Location: [facility];  Service: Cardiovascular;  Laterality: N/A;   FEMUR IM NAIL Right 07/15/2012   Procedure: INTRAMEDULLARY (IM) NAIL HIP;  Surgeon: Johnny Bridge, MD;  Location: [facility];  Service: Orthopedics;  Laterality: Right;   FEMUR IM NAIL Left 12/02/2015   Procedure: INTRAMEDULLARY (IM) NAIL FEMORAL;  Surgeon:  [name], MD;  Location: [facility];  Service: Orthopedics;  Laterality: Left;   FRACTURE SURGERY     INCISION AND DRAINAGE HIP Left 01/03/2016   Procedure: IRRIGATION AND DEBRIDEMENT HIP;  Surgeon: [name], MD;  Location: [facility];  Service: Orthopedics;  Laterality: Left;   SHOULDER SURGERY Bilateral    "don't remember which side"   Social History:  reports that she quit smoking about 44 years ago. Her smoking use included cigarettes. She has never used smokeless tobacco. She reports  that she does not drink alcohol and does not use drugs. Family History:  Family History  Problem Relation Age of Onset   Diabetes Father    Heart attack Father 89   Diabetes Sister    Drug abuse Son    Cancer Neg Hx        no hx of colon or breast cancer     HOME MEDICATIONS: Allergies as of 05/22/2022       Reactions   Procaine Hcl Anaphylaxis   Amoxicillin Itching   Did it involve swelling of the face/tongue/throat, SOB, or low BP? No Did it involve sudden or severe rash/hives, skin peeling, or any reaction on the inside of your mouth or nose? No Did you need to seek medical attention at a hospital or doctor's office? No When did it last happen? unk If all above answers are "NO", may proceed with cephalosporin use.        Medication List        Accurate as of May 22, 2022  2:25 PM. If you have any questions, ask your nurse or doctor.          atorvastatin 80 MG tablet Commonly known as: LIPITOR Take 1 tablet by mouth once daily   denosumab 60 MG/ML Sosy injection Commonly known as: PROLIA Inject 60 mg into the skin every 6 (six) months.   ezetimibe 10 MG tablet Commonly known as: ZETIA Take 1 tablet (10 mg total) by mouth daily.   furosemide 40 MG tablet Commonly known as: LASIX TAKE 1 TABLET BY MOUTH TWICE DAILY AS NEEDED FOR FLUID   metoprolol succinate 50 MG 24 hr tablet Commonly known as: TOPROL-XL TAKE 1 TABLET BY MOUTH ONCE DAILY . APPOINTMENT REQUIRED FOR FUTURE REFILLS   MULTIVITAMIN ADULT PO Take 1 tablet by mouth daily with breakfast.   NovoLIN 70/30 ReliOn (70-30) 100 UNIT/ML injection Generic drug: insulin NPH-regular Human 26 units with breakfast and 2 units with the evening meal.   VITAMIN D (CHOLECALCIFEROL) PO Take by mouth.   warfarin 2.5 MG tablet Commonly known as: COUMADIN Take as directed by the anticoagulation clinic. If you are unsure how to take this medication, talk to your nurse or doctor. Original instructions: TAKE  1/2 TO 1 TABLET BY MOUTH ONCE DAILY AS DIRECTED BY  COUMADIN  CLINIC         OBJECTIVE:   Vital Signs: BP 124/72 (BP Location: Left Arm, Patient Position: Sitting, Cuff Size: Large)   Pulse 72   Ht 5\' 8"  (1.727 m)   Wt 171 lb (77.6 kg)   SpO2 95%   BMI 26.00 kg/m   Wt Readings from Last 3 Encounters:  05/22/22 171 lb (77.6 kg)  05/18/22 164 lb (74.4 kg)  05/05/22 166 lb (75.3 kg)     Exam: General: Pt appears well and is in NAD  Neck: General: Supple without adenopathy. Thyroid: Thyroid size normal.  No goiter or nodules appreciated.   Lungs:  Clear with good BS bilat   Heart: RRR   Extremities: No pretibial edema.   Neuro: MS is good with appropriate affect, pt is alert and Ox3    DM foot exam: 05/22/2022  The skin of the feet is intact without sores or ulcerations. The pedal pulses are 1+ on right and 1+ on left. The sensation is intact to a screening 5.07, 10 gram monofilament bilaterally      DATA REVIEWED:  Lab Results  Component Value Date   HGBA1C 9.0 (H) 05/05/2022   HGBA1C 7.3 (A) 06/18/2021   HGBA1C 8.3 (A) 03/14/2021   Lab Results  Component Value Date   MICROALBUR 5.6 (H) 07/07/2021   LDLCALC 96 07/07/2021   CREATININE 1.88 (H) 05/05/2022    Latest Reference Range & Units 05/05/22 12:31  Sodium 135 - 145 mEq/L 139  Potassium 3.5 - 5.1 mEq/L 4.4  Chloride 96 - 112 mEq/L 98  CO2 19 - 32 mEq/L 31  Glucose 70 - 99 mg/dL 147 (H)  BUN 6 - 23 mg/dL 33 (H)  Creatinine 0.40 - 1.20 mg/dL 1.88 (H)  Calcium 8.4 - 10.5 mg/dL 9.4  GFR >60.00 mL/min 24.97 (L)   In office BG 343 mg/dL    Old records , labs and images have been reviewed.   ASSESSMENT / PLAN / RECOMMENDATIONS:   1) Type 2 Diabetes Mellitus, Poorly controlled, With neuropathic, CKD IV, and macrovascular  complications - Most recent A1c of 9.0 %. Goal A1c < 7.5 %.    -Her A1c has increased from 7.3% to 9.0% -I am unclear of the cause, the patient denies dietary indiscretions, she  also assures me compliance with insulin intake, based on that information I have no other option but to increase her morning dose of insulin -She does endorse optimal fasting BG's? 90-140 mg/dL , her in office BG today is 343 mg/DL -The patient declines any add-on therapy and would like to remain on insulin -She uses insulin vials and pens as well, she did not need any refills today -Patient advised to bring her meter in the future -Patient is cost conscious, and has opted to remain on human insulin  MEDICATIONS: Increase Novolin mix 26 units before breakfast and 5 units at bedtime  EDUCATION / INSTRUCTIONS: BG monitoring instructions: Patient is instructed to check her blood sugars 1 times a day. Call [facility] if: BG persistently < 70  I reviewed the Rule of 15 for the treatment of hypoglycemia in detail with the patient. Literature supplied.    2) Diabetic complications:  Eye: Does not have known diabetic retinopathy.  Neuro/ Feet: Does  have known diabetic peripheral neuropathy .  Renal: Patient does  have known baseline CKD. She   is not on an ACEI/ARB at present.      F/U in 4 months     Signed electronically by: Mack Guise, MD  [facility]  [facility] [address] [place], [place] [address] Phone: [phone] FAX: [phone]   CC: Colon Branch, [address] [place] [address] Phone: [phone]  Fax: [phone]  Return to Endocrinology clinic as below: Future Appointments  Date Time Provider [place]  06/15/2022 11:15 AM [facility] None  09/02/2022 11:00 AM Colon Branch, MD [facility]  11/30/2022  2:30 PM Garvin Fila, MD [facility] None

## 2022-05-25 ENCOUNTER — Other Ambulatory Visit: Payer: Self-pay | Admitting: Cardiovascular Disease

## 2022-05-25 NOTE — Telephone Encounter (Signed)
Rx(s) sent to pharmacy electronically.  

## 2022-06-09 ENCOUNTER — Encounter: Payer: Self-pay | Admitting: Internal Medicine

## 2022-06-13 ENCOUNTER — Other Ambulatory Visit: Payer: Self-pay | Admitting: Cardiovascular Disease

## 2022-06-13 DIAGNOSIS — I480 Paroxysmal atrial fibrillation: Secondary | ICD-10-CM

## 2022-06-15 ENCOUNTER — Ambulatory Visit: Payer: Medicare Other | Attending: Cardiovascular Disease | Admitting: *Deleted

## 2022-06-15 DIAGNOSIS — I4891 Unspecified atrial fibrillation: Secondary | ICD-10-CM | POA: Diagnosis present

## 2022-06-15 DIAGNOSIS — Z5181 Encounter for therapeutic drug level monitoring: Secondary | ICD-10-CM | POA: Diagnosis present

## 2022-06-15 LAB — POCT INR: INR: 4.6 — AB (ref 2.0–3.0)

## 2022-06-15 NOTE — Telephone Encounter (Signed)
Refill request for warfarin:  Last INR was 2.7 on 05/04/22 Next INR due 06/15/22 LOV was 10/29/21  Refill approved.

## 2022-06-15 NOTE — Patient Instructions (Addendum)
Description   Do not take any warfarin today and no warfarin tomorrow then continue taking warfarin 1/2 a tablet daily except 1 tablet on Monday, Wednesday and Friday. Stay consistent with leafy veggies to your diet. Recheck INR in 3 weeks.  Coumadin Clinic [phone] or [phone]

## 2022-06-25 ENCOUNTER — Encounter: Payer: Self-pay | Admitting: Internal Medicine

## 2022-06-29 ENCOUNTER — Emergency Department (HOSPITAL_BASED_OUTPATIENT_CLINIC_OR_DEPARTMENT_OTHER): Payer: Medicare Other

## 2022-06-29 ENCOUNTER — Other Ambulatory Visit: Payer: Self-pay

## 2022-06-29 ENCOUNTER — Emergency Department (HOSPITAL_BASED_OUTPATIENT_CLINIC_OR_DEPARTMENT_OTHER)
Admission: EM | Admit: 2022-06-29 | Discharge: 2022-06-30 | Disposition: A | Discharge status: Home / Self Care | Payer: Medicare Other | Attending: Emergency Medicine | Admitting: Emergency Medicine

## 2022-06-29 ENCOUNTER — Encounter (HOSPITAL_BASED_OUTPATIENT_CLINIC_OR_DEPARTMENT_OTHER): Payer: Self-pay | Admitting: Emergency Medicine

## 2022-06-29 DIAGNOSIS — R0789 Other chest pain: Secondary | ICD-10-CM | POA: Diagnosis present

## 2022-06-29 DIAGNOSIS — Z87891 Personal history of nicotine dependence: Secondary | ICD-10-CM | POA: Insufficient documentation

## 2022-06-29 DIAGNOSIS — I129 Hypertensive chronic kidney disease with stage 1 through stage 4 chronic kidney disease, or unspecified chronic kidney disease: Secondary | ICD-10-CM | POA: Insufficient documentation

## 2022-06-29 DIAGNOSIS — Z79899 Other long term (current) drug therapy: Secondary | ICD-10-CM | POA: Diagnosis not present

## 2022-06-29 DIAGNOSIS — Z794 Long term (current) use of insulin: Secondary | ICD-10-CM | POA: Diagnosis not present

## 2022-06-29 DIAGNOSIS — R1032 Left lower quadrant pain: Secondary | ICD-10-CM | POA: Diagnosis not present

## 2022-06-29 DIAGNOSIS — N183 Chronic kidney disease, stage 3 unspecified: Secondary | ICD-10-CM | POA: Diagnosis not present

## 2022-06-29 DIAGNOSIS — I4891 Unspecified atrial fibrillation: Secondary | ICD-10-CM | POA: Diagnosis not present

## 2022-06-29 DIAGNOSIS — E1122 Type 2 diabetes mellitus with diabetic chronic kidney disease: Secondary | ICD-10-CM | POA: Diagnosis not present

## 2022-06-29 NOTE — ED Triage Notes (Signed)
Patient awoken from sleep due to under left breast and radiating to left flank/abdomen area.  No nausea or vomiting or diarrhea, she was just checked for UTI with PCP.  She states she has chronic left breast pain, but this is worst than before, and it has not ever radiated anywhere.  No dizziness, but does have a mild HA.

## 2022-06-29 NOTE — ED Notes (Signed)
Patient transported to X-ray 

## 2022-06-29 NOTE — ED Provider Notes (Signed)
[facility] Provider Note: Lowella Dell, MD, FACEP  CSN: [id number] MRN: [id number] ARRIVAL: 06/29/22 at 2310 ROOM: MH06/MH06   CHIEF COMPLAINT  Chest and Abdominal Pain   HISTORY OF PRESENT ILLNESS  06/29/22 11:29 PM [name] [name] is a 82 y.o. female with a several year history of pain in her chest between her 2 breasts.  Usually this pain is brought on by movement such as bending over.  Tonight about 10 PM she was lying in bed when she have developed pain between her breasts which is similar to previous pain but more intense.  At the same time she developed left lower quadrant abdominal pain.  The pain in her chest is worse with movement or palpation.  She is equivocal about associated shortness of breath.  She had no nausea, vomiting or diarrhea with this.  The abdominal pain is now gone and her chest pain is improving.  She has chronic atrial fibrillation on Coumadin.  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   HYPERLIPIDEMIA 03/27/2006   HYPERTENSION 03/27/2006   Intertrochanteric fracture of right hip ([facility]) 07/13/2012   LUNG NODULE 09/01/2006   Excision, Bx Benighn   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"   Osteopenia 2004   Dexa 2004 showed Osteopenia, DEXA 03/2007 normal   Osteopenia    Other chronic cystitis with hematuria    Pelvic fracture ([facility]) 06/16/2016   S/P fall   Recurrent urinary tract infection    Seeing Urology   RETINOPATHY, 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/2006   Seizures ([facility])    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    Past Surgical History:  Procedure Laterality Date   ANKLE FRACTURE SURGERY Bilateral 08/2015   "right one was in 2 places"   CARDIAC CATHETERIZATION N/A 05/17/2015   Procedure: Left Heart Cath and Coronary Angiography;  Surgeon: Corky Crafts, MD;  Location: [facility] INVASIVE CV LAB;  Service: Cardiovascular;  Laterality: N/A;   CARDIAC CATHETERIZATION N/A 05/17/2015   Procedure: Coronary Balloon Angioplasty;  Surgeon: Corky Crafts, MD;  Location: [facility] INVASIVE CV LAB;  Service: Cardiovascular;  Laterality: N/A;   FEMUR IM NAIL Right 07/15/2012   Procedure: INTRAMEDULLARY (IM) NAIL HIP;  Surgeon: Eulas Post, MD;  [facility];  Service: Orthopedics;  Laterality: Right;   FEMUR IM NAIL Left 12/02/2015   Procedure: INTRAMEDULLARY (IM) NAIL FEMORAL;  Surgeon: Eldred Manges, MD;  Location[facility];  Service: Orthopedics;  Laterality: Left;   FRACTURE SURGERY     INCISION AND DRAINAGE HIP Left 01/03/2016   Procedure: IRRIGATION AND DEBRIDEMENT HIP;  Surgeon: Eldred Manges, MD;  Location[facility];  Service: Orthopedics;  Laterality: Left;   SHOULDER SURGERY Bilateral    "don't remember which side"    Family History  Problem Relation Age of Onset   Diabetes Father    Heart attack Father 75   Diabetes Sister    Drug abuse Son    Cancer Neg  Hx        no hx of colon or breast cancer    Social History   Tobacco Use   Smoking status: Former    Types: Cigarettes    Quit date: 03/02/1978    Years since quitting: 44.3   Smokeless tobacco: Never  Vaping Use   Vaping Use: Never used  Substance Use Topics   Alcohol use: No    Alcohol/week: 0.0 standard drinks of alcohol   Drug use: No    Prior to Admission medications   Medication Sig Start Date End Date Taking? Authorizing Provider  atorvastatin (LIPITOR) 80 MG tablet Take 1 tablet by mouth once daily 10/01/21   [name], MD  denosumab (PROLIA) 60 MG/ML SOSY injection Inject 60 mg into the skin every 6 (six)  months.    [provider]  ezetimibe (ZETIA) 10 MG tablet Take 1 tablet (10 mg total) by mouth daily. 12/04/21   Chilton Si, MD  furosemide (LASIX) 40 MG tablet TAKE 1 TABLET BY MOUTH TWICE DAILY AS NEEDED FOR FLUID 10/01/21   [name], MD  insulin NPH-regular Human (NOVOLIN 70/30 RELION) (70-30) 100 UNIT/ML injection 26 units with breakfast and 2 units with the evening meal. 03/14/21   Romero Belling, MD  metoprolol succinate (TOPROL-XL) 50 MG 24 hr tablet TAKE 1 TABLET BY MOUTH ONCE DAILY . APPOINTMENT REQUIRED FOR FUTURE REFILLS 06/15/22   Chilton Si, MD  Multiple Vitamins-Minerals (MULTIVITAMIN ADULT PO) Take 1 tablet by mouth daily with breakfast.     [provider]  VITAMIN D, CHOLECALCIFEROL, PO Take by mouth.    [provider]  warfarin (COUMADIN) 2.5 MG tablet TAKE 1/2 TO 1 (ONE-HALF TO ONE) TABLET BY MOUTH ONCE DAILY 06/15/22   Chilton Si, MD    Allergies Procaine hcl and Amoxicillin   REVIEW OF SYSTEMS  Negative except as noted here or in the History of Present Illness.   PHYSICAL EXAMINATION  Initial Vital Signs Blood pressure (!) 149/76, pulse (!) 108, temperature 97.6 F (36.4 C), temperature source Oral, resp. rate 16, SpO2 99 %.  Examination General: Well-developed, well-nourished female in no acute distress; appearance consistent with age of record HENT: normocephalic; atraumatic Eyes: Normal appearance Neck: supple Heart: regular rate and rhythm Lungs: clear to auscultation bilaterally Chest: Sternal tenderness Abdomen: soft; nondistended; nontender; bowel sounds present Extremities: No deformity; full range of motion; pulses normal Neurologic: Awake, alert and oriented; motor function intact in all extremities and symmetric; no facial droop Skin: Warm and dry Psychiatric: Flat affect   RESULTS  Summary of this visit's results, reviewed and interpreted by myself:   EKG Interpretation  Date/Time:  Monday June 29 2022 23:17:30 EDT Ventricular Rate:  97 PR Interval:  103 QRS Duration: 100 QT Interval:  328 QTC Calculation: 417 R Axis:   27 Text Interpretation: Atrial fibrillation Anterior infarct, old Borderline repolarization abnormality Confirmed by Paula Libra ([id number]) on 06/29/2022 11:27:16 PM       Laboratory Studies: Results for orders placed or performed during the hospital encounter of 06/29/22 (from the past 24 hour(s))  CBC with Differential     Status: Abnormal   Collection Time: 06/30/22 12:03 AM  Result Value Ref Range   WBC 10.8 (H) 4.0 - 10.5 K/uL   RBC 3.97 3.87 - 5.11 MIL/uL   Hemoglobin 11.8 (L) 12.0 - 15.0 g/dL   HCT 60.4 54.0 - 98.1 %   MCV 91.4 80.0 - 100.0 fL   MCH 29.7  26.0 - 34.0 pg   MCHC 32.5 30.0 - 36.0 g/dL   RDW 13.0 86.5 - 78.4 %   Platelets 496 (H) 150 - 400 K/uL   nRBC 0.0 0.0 - 0.2 %   Neutrophils Relative % 70 %   Neutro Abs 7.5 1.7 - 7.7 K/uL   Lymphocytes Relative 19 %   Lymphs Abs 2.1 0.7 - 4.0 K/uL   Monocytes Relative 10 %   Monocytes Absolute 1.1 (H) 0.1 - 1.0 K/uL   Eosinophils Relative 1 %   Eosinophils Absolute 0.1 0.0 - 0.5 K/uL   Basophils Relative 0 %   Basophils Absolute 0.0 0.0 - 0.1 K/uL   Immature Granulocytes 0 %   Abs Immature Granulocytes 0.03 0.00 - 0.07 K/uL  Troponin I (High Sensitivity)     Status: None   Collection Time: 06/30/22 12:24 AM  Result Value Ref Range   Troponin I (High Sensitivity) 10 <18 ng/L  Basic metabolic panel     Status: Abnormal   Collection Time: 06/30/22 12:24 AM  Result Value Ref Range   Sodium 134 (L) 135 - 145 mmol/L   Potassium 3.3 (L) 3.5 - 5.1 mmol/L   Chloride 99 98 - 111 mmol/L   CO2 26 22 - 32 mmol/L   Glucose, Bld 250 (H) 70 - 99 mg/dL   BUN 31 (H) 8 - 23 mg/dL   Creatinine, Ser 6.96 (H) 0.44 - 1.00 mg/dL   Calcium 7.9 (L) 8.9 - 10.3 mg/dL   GFR, Estimated 31 (L) >60 mL/min   Anion gap 9 5 - 15  Troponin I (High Sensitivity)     Status: None   Collection Time: 06/30/22  2:18 AM   Result Value Ref Range   Troponin I (High Sensitivity) 10 <18 ng/L   *Note: Due to a large number of results and/or encounters for the requested time period, some results have not been displayed. A complete set of results can be found in Results Review.   Imaging Studies: DG Chest 2 View  Result Date: 06/30/2022 CLINICAL DATA:  Left-sided flank pain and chest pain, initial encounter EXAM: CHEST - 2 VIEW COMPARISON:  01/20/2022 FINDINGS: Cardiac shadow is stable. Aortic calcifications are seen. The lungs are well aerated without focal infiltrate. Healing right rib fracture is noted involving the posterior sixth rib. IMPRESSION: No acute abnormality noted. Electronically Signed   By: Alcide Clever M.D.   On: 06/30/2022 00:07    ED COURSE and MDM  Nursing notes, initial and subsequent vitals signs, including pulse oximetry, reviewed and interpreted by myself.  Vitals:   06/29/22 2320 06/29/22 2330 06/30/22 0230  BP: (!) 149/76 (!) 151/82 109/74  Pulse: (!) 108 70 92  Resp: 16 14 (!) 9  Temp: 97.6 F (36.4 C)    TempSrc: Oral    SpO2: 99% 100% 98%   Medications - No data to display  2:55 AM The patient is pain-free and her sternum is no longer tender to palpation.  She was clearly tender on arrival and in previous episodes of similar pain it is occurred with movement or palpation.  I suspect this represents chest wall pain.  Her troponins are normal and her EKG is not showing ischemic changes although she is in chronic atrial fibrillation.  Why this episode of chest pain also caused pain in the left lower quadrant of the abdomen is unclear.  PROCEDURES  Procedures   ED DIAGNOSES     ICD-10-CM   1. Atypical chest pain  R07.89          Paula Libra, MD 06/30/22 [phone]

## 2022-06-30 DIAGNOSIS — R0789 Other chest pain: Secondary | ICD-10-CM | POA: Diagnosis not present

## 2022-06-30 LAB — CBC WITH DIFFERENTIAL/PLATELET
Abs Immature Granulocytes: 0.03 10*3/uL (ref 0.00–0.07)
Basophils Absolute: 0 10*3/uL (ref 0.0–0.1)
Basophils Relative: 0 %
Eosinophils Absolute: 0.1 10*3/uL (ref 0.0–0.5)
Eosinophils Relative: 1 %
HCT: 36.3 % (ref 36.0–46.0)
Hemoglobin: 11.8 g/dL — ABNORMAL LOW (ref 12.0–15.0)
Immature Granulocytes: 0 %
Lymphocytes Relative: 19 %
Lymphs Abs: 2.1 10*3/uL (ref 0.7–4.0)
MCH: 29.7 pg (ref 26.0–34.0)
MCHC: 32.5 g/dL (ref 30.0–36.0)
MCV: 91.4 fL (ref 80.0–100.0)
Monocytes Absolute: 1.1 10*3/uL — ABNORMAL HIGH (ref 0.1–1.0)
Monocytes Relative: 10 %
Neutro Abs: 7.5 10*3/uL (ref 1.7–7.7)
Neutrophils Relative %: 70 %
Platelets: 496 10*3/uL — ABNORMAL HIGH (ref 150–400)
RBC: 3.97 MIL/uL (ref 3.87–5.11)
RDW: 12.5 % (ref 11.5–15.5)
WBC: 10.8 10*3/uL — ABNORMAL HIGH (ref 4.0–10.5)
nRBC: 0 % (ref 0.0–0.2)

## 2022-06-30 LAB — BASIC METABOLIC PANEL
Anion gap: 9 (ref 5–15)
BUN: 31 mg/dL — ABNORMAL HIGH (ref 8–23)
CO2: 26 mmol/L (ref 22–32)
Calcium: 7.9 mg/dL — ABNORMAL LOW (ref 8.9–10.3)
Chloride: 99 mmol/L (ref 98–111)
Creatinine, Ser: 1.68 mg/dL — ABNORMAL HIGH (ref 0.44–1.00)
GFR, Estimated: 31 mL/min — ABNORMAL LOW (ref >60)
Glucose, Bld: 250 mg/dL — ABNORMAL HIGH (ref 70–99)
Potassium: 3.3 mmol/L — ABNORMAL LOW (ref 3.5–5.1)
Sodium: 134 mmol/L — ABNORMAL LOW (ref 135–145)

## 2022-06-30 LAB — TROPONIN I (HIGH SENSITIVITY)
Troponin I (High Sensitivity): 10 ng/L (ref <18)
Troponin I (High Sensitivity): 10 ng/L (ref <18)

## 2022-07-06 ENCOUNTER — Ambulatory Visit (INDEPENDENT_AMBULATORY_CARE_PROVIDER_SITE_OTHER): Payer: Medicare Other

## 2022-07-06 DIAGNOSIS — I4891 Unspecified atrial fibrillation: Secondary | ICD-10-CM

## 2022-07-06 DIAGNOSIS — N136 Pyonephrosis: Secondary | ICD-10-CM | POA: Diagnosis not present

## 2022-07-06 DIAGNOSIS — Z5181 Encounter for therapeutic drug level monitoring: Secondary | ICD-10-CM | POA: Insufficient documentation

## 2022-07-06 DIAGNOSIS — R310 Gross hematuria: Secondary | ICD-10-CM | POA: Diagnosis not present

## 2022-07-06 LAB — POCT INR: INR: 7.9 — AB (ref 2.0–3.0)

## 2022-07-06 LAB — PROTIME-INR
INR: 7.9 (ref 0.9–1.2)
Prothrombin Time: 71.3 s — ABNORMAL HIGH (ref 9.1–12.0)

## 2022-07-06 NOTE — Patient Instructions (Addendum)
Description   STAT INR 7.9. Called and instructed pt to Eat greens, HOLD Warfarin today, tomorrow, and Wednesday and then START taking 1/2 a tablet daily EXCEPT 1 tablet on Monday and Wednesday. Advised pt to seek immediate medical attention if signs or symptoms of bleeding occur. Pt verbalized understanding.   Stay consistent with leafy veggies to your diet.  Recheck INR on Friday, 07/10/22. Coumadin Clinic [phone]

## 2022-07-07 ENCOUNTER — Encounter (HOSPITAL_BASED_OUTPATIENT_CLINIC_OR_DEPARTMENT_OTHER): Payer: Self-pay | Admitting: Emergency Medicine

## 2022-07-07 ENCOUNTER — Other Ambulatory Visit: Payer: Self-pay

## 2022-07-07 ENCOUNTER — Emergency Department (HOSPITAL_BASED_OUTPATIENT_CLINIC_OR_DEPARTMENT_OTHER): Payer: Medicare Other

## 2022-07-07 ENCOUNTER — Inpatient Hospital Stay (HOSPITAL_BASED_OUTPATIENT_CLINIC_OR_DEPARTMENT_OTHER)
Admission: EM | Admit: 2022-07-07 | Discharge: 2022-07-10 | DRG: 690 | Disposition: A | Discharge status: Home / Self Care | Payer: Medicare Other | Attending: Internal Medicine | Admitting: Internal Medicine

## 2022-07-07 DIAGNOSIS — Z8619 Personal history of other infectious and parasitic diseases: Secondary | ICD-10-CM

## 2022-07-07 DIAGNOSIS — R791 Abnormal coagulation profile: Secondary | ICD-10-CM

## 2022-07-07 DIAGNOSIS — K649 Unspecified hemorrhoids: Secondary | ICD-10-CM | POA: Diagnosis present

## 2022-07-07 DIAGNOSIS — R319 Hematuria, unspecified: Principal | ICD-10-CM

## 2022-07-07 DIAGNOSIS — I480 Paroxysmal atrial fibrillation: Secondary | ICD-10-CM | POA: Diagnosis present

## 2022-07-07 DIAGNOSIS — I2510 Atherosclerotic heart disease of native coronary artery without angina pectoris: Secondary | ICD-10-CM

## 2022-07-07 DIAGNOSIS — H5462 Unqualified visual loss, left eye, normal vision right eye: Secondary | ICD-10-CM | POA: Diagnosis present

## 2022-07-07 DIAGNOSIS — Z888 Allergy status to other drugs, medicaments and biological substances status: Secondary | ICD-10-CM

## 2022-07-07 DIAGNOSIS — I429 Cardiomyopathy, unspecified: Secondary | ICD-10-CM

## 2022-07-07 DIAGNOSIS — I10 Essential (primary) hypertension: Secondary | ICD-10-CM | POA: Diagnosis present

## 2022-07-07 DIAGNOSIS — Z79899 Other long term (current) drug therapy: Secondary | ICD-10-CM

## 2022-07-07 DIAGNOSIS — E1122 Type 2 diabetes mellitus with diabetic chronic kidney disease: Secondary | ICD-10-CM | POA: Diagnosis present

## 2022-07-07 DIAGNOSIS — D649 Anemia, unspecified: Secondary | ICD-10-CM | POA: Diagnosis present

## 2022-07-07 DIAGNOSIS — Z881 Allergy status to other antibiotic agents status: Secondary | ICD-10-CM

## 2022-07-07 DIAGNOSIS — R310 Gross hematuria: Secondary | ICD-10-CM | POA: Diagnosis present

## 2022-07-07 DIAGNOSIS — B9620 Unspecified Escherichia coli [E. coli] as the cause of diseases classified elsewhere: Secondary | ICD-10-CM | POA: Diagnosis present

## 2022-07-07 DIAGNOSIS — Z9861 Coronary angioplasty status: Secondary | ICD-10-CM

## 2022-07-07 DIAGNOSIS — Z8249 Family history of ischemic heart disease and other diseases of the circulatory system: Secondary | ICD-10-CM

## 2022-07-07 DIAGNOSIS — E785 Hyperlipidemia, unspecified: Secondary | ICD-10-CM | POA: Diagnosis present

## 2022-07-07 DIAGNOSIS — I255 Ischemic cardiomyopathy: Secondary | ICD-10-CM | POA: Diagnosis present

## 2022-07-07 DIAGNOSIS — Z794 Long term (current) use of insulin: Secondary | ICD-10-CM

## 2022-07-07 DIAGNOSIS — R0789 Other chest pain: Secondary | ICD-10-CM | POA: Diagnosis present

## 2022-07-07 DIAGNOSIS — Z880 Allergy status to penicillin: Secondary | ICD-10-CM

## 2022-07-07 DIAGNOSIS — Z833 Family history of diabetes mellitus: Secondary | ICD-10-CM

## 2022-07-07 DIAGNOSIS — E872 Acidosis, unspecified: Secondary | ICD-10-CM | POA: Diagnosis present

## 2022-07-07 DIAGNOSIS — Z87440 Personal history of urinary (tract) infections: Secondary | ICD-10-CM

## 2022-07-07 DIAGNOSIS — I5022 Chronic systolic (congestive) heart failure: Secondary | ICD-10-CM | POA: Diagnosis present

## 2022-07-07 DIAGNOSIS — Z7901 Long term (current) use of anticoagulants: Secondary | ICD-10-CM

## 2022-07-07 DIAGNOSIS — Z87891 Personal history of nicotine dependence: Secondary | ICD-10-CM

## 2022-07-07 DIAGNOSIS — N3091 Cystitis, unspecified with hematuria: Secondary | ICD-10-CM

## 2022-07-07 DIAGNOSIS — K219 Gastro-esophageal reflux disease without esophagitis: Secondary | ICD-10-CM | POA: Diagnosis present

## 2022-07-07 DIAGNOSIS — N136 Pyonephrosis: Principal | ICD-10-CM | POA: Diagnosis present

## 2022-07-07 DIAGNOSIS — M8580 Other specified disorders of bone density and structure, unspecified site: Secondary | ICD-10-CM | POA: Diagnosis present

## 2022-07-07 DIAGNOSIS — N184 Chronic kidney disease, stage 4 (severe): Secondary | ICD-10-CM | POA: Diagnosis present

## 2022-07-07 DIAGNOSIS — I4891 Unspecified atrial fibrillation: Secondary | ICD-10-CM | POA: Diagnosis present

## 2022-07-07 DIAGNOSIS — T45515A Adverse effect of anticoagulants, initial encounter: Secondary | ICD-10-CM | POA: Diagnosis present

## 2022-07-07 DIAGNOSIS — D6832 Hemorrhagic disorder due to extrinsic circulating anticoagulants: Secondary | ICD-10-CM | POA: Diagnosis present

## 2022-07-07 DIAGNOSIS — Z66 Do not resuscitate: Secondary | ICD-10-CM | POA: Diagnosis present

## 2022-07-07 DIAGNOSIS — I130 Hypertensive heart and chronic kidney disease with heart failure and stage 1 through stage 4 chronic kidney disease, or unspecified chronic kidney disease: Secondary | ICD-10-CM | POA: Diagnosis present

## 2022-07-07 LAB — CBC WITH DIFFERENTIAL/PLATELET
Abs Immature Granulocytes: 0.06 10*3/uL (ref 0.00–0.07)
Basophils Absolute: 0 10*3/uL (ref 0.0–0.1)
Basophils Relative: 0 %
Eosinophils Absolute: 0.1 10*3/uL (ref 0.0–0.5)
Eosinophils Relative: 1 %
HCT: 34.1 % — ABNORMAL LOW (ref 36.0–46.0)
Hemoglobin: 11.1 g/dL — ABNORMAL LOW (ref 12.0–15.0)
Immature Granulocytes: 0 %
Lymphocytes Relative: 14 %
Lymphs Abs: 2.1 10*3/uL (ref 0.7–4.0)
MCH: 30 pg (ref 26.0–34.0)
MCHC: 32.6 g/dL (ref 30.0–36.0)
MCV: 92.2 fL (ref 80.0–100.0)
Monocytes Absolute: 1.5 10*3/uL — ABNORMAL HIGH (ref 0.1–1.0)
Monocytes Relative: 10 %
Neutro Abs: 11 10*3/uL — ABNORMAL HIGH (ref 1.7–7.7)
Neutrophils Relative %: 75 %
Platelets: 574 10*3/uL — ABNORMAL HIGH (ref 150–400)
RBC: 3.7 MIL/uL — ABNORMAL LOW (ref 3.87–5.11)
RDW: 12.8 % (ref 11.5–15.5)
WBC: 14.8 10*3/uL — ABNORMAL HIGH (ref 4.0–10.5)
nRBC: 0 % (ref 0.0–0.2)

## 2022-07-07 LAB — COMPREHENSIVE METABOLIC PANEL
ALT: 19 U/L (ref 0–44)
AST: 39 U/L (ref 15–41)
Albumin: 2.7 g/dL — ABNORMAL LOW (ref 3.5–5.0)
Alkaline Phosphatase: 79 U/L (ref 38–126)
Anion gap: 13 (ref 5–15)
BUN: 29 mg/dL — ABNORMAL HIGH (ref 8–23)
CO2: 26 mmol/L (ref 22–32)
Calcium: 8.5 mg/dL — ABNORMAL LOW (ref 8.9–10.3)
Chloride: 97 mmol/L — ABNORMAL LOW (ref 98–111)
Creatinine, Ser: 1.83 mg/dL — ABNORMAL HIGH (ref 0.44–1.00)
GFR, Estimated: 28 mL/min — ABNORMAL LOW (ref >60)
Glucose, Bld: 147 mg/dL — ABNORMAL HIGH (ref 70–99)
Potassium: 4 mmol/L (ref 3.5–5.1)
Sodium: 136 mmol/L (ref 135–145)
Total Bilirubin: 0.7 mg/dL (ref 0.3–1.2)
Total Protein: 7.3 g/dL (ref 6.5–8.1)

## 2022-07-07 LAB — URINALYSIS, W/ REFLEX TO CULTURE (INFECTION SUSPECTED): RBC / HPF: >50 RBC/hpf (ref 0–5)

## 2022-07-07 LAB — PROTIME-INR
INR: 5.2 (ref 0.8–1.2)
Prothrombin Time: 47.8 seconds — ABNORMAL HIGH (ref 11.4–15.2)

## 2022-07-07 LAB — OCCULT BLOOD X 1 CARD TO LAB, STOOL: Fecal Occult Bld: NEGATIVE

## 2022-07-07 MED ORDER — SODIUM CHLORIDE 0.9 % IR SOLN
3000.0000 mL | Status: DC
  Administered 2022-07-08 (×2): 3000 mL
  Filled 2022-07-07: qty 3000

## 2022-07-07 MED ORDER — SODIUM CHLORIDE 0.9 % IV SOLN
1.0000 g | Freq: Once | INTRAVENOUS | Status: AC
  Administered 2022-07-07: 1 g via INTRAVENOUS
  Filled 2022-07-07: qty 10

## 2022-07-07 NOTE — Progress Notes (Signed)
Plan of Care Note for accepted transfer   Patient: [name] [name] MRN: [id number]   DOA: 07/07/2022  Facility requesting transfer: [facility]   Requesting Provider: Dr. Dalene Seltzer   Reason for transfer: Gross hematuria with urinary retention   Facility course: 82 yr old lady with hx of HTN, T2DM, CKD 3B, ESBL UTIs, HFmrEF, and atrial fibrillation on warfarin who presents with difficulty voiding and blood in commode.   Her INR was found to be 7.9 yesterday and she was instructed to hold warfarin. INR is 5.2 today in ED, Hgb is 11.1 (11.8 one wk ago), WBC 14,800, albumin is 2.7, and SCr is 1.83 (slightly up from baseline). CT findings concerning for hemorrhagic cystitis with left hydroureteronephrosis d/t clot or pyelo.   Urology was consulted by ED physician, urine was cultured, and she was treated with Rocephin. ED planning to place 3-way cathter for irrigation.   Plan of care: The patient is accepted for admission to Med-surg  unit, at [facility].   Author: Briscoe Deutscher, MD 07/07/2022  Check [email or web address] for on-call coverage.  Nursing staff, Please call [facility] Admits & Consults System-Wide number on Amion as soon as patient's arrival, so appropriate admitting provider can evaluate the pt.

## 2022-07-07 NOTE — ED Triage Notes (Signed)
Patient brought in by wheelchair c/o decreased urine output and abnormal BMs throughout today. Patient unsure if blood in stool stating her eyesight is not good and need to check with her husband. Patient has INR checked yesterday and was told to hold coumadin.

## 2022-07-07 NOTE — ED Notes (Signed)
CBI to be delayed due to [facility] not having proper equipment here. Received verbal order from Dr. Dalene Seltzer to manually irrigate at this time.

## 2022-07-07 NOTE — ED Notes (Signed)
Bladder irrigated with 420 cc of sterile water. 240 cc of bloody urine returned. After about halfway through the irrigation the urine went from a dark deep maroon red to a lighter red color.

## 2022-07-07 NOTE — ED Notes (Signed)
Pt resting comfortably in bed, no distress noted. Even rise and fall of chest noted.

## 2022-07-07 NOTE — ED Notes (Signed)
Lab notified to add-on urine culture to previously collected urine sample.  

## 2022-07-07 NOTE — ED Notes (Signed)
Dr. Schlossman at bedside. 

## 2022-07-07 NOTE — ED Provider Notes (Signed)
[facility] AT [facility] Provider Note   CSN: [id number] Arrival date & time: 07/07/22  1624     History {Add pertinent medical, surgical, social history, OB history to HPI:1} Chief Complaint  Patient presents with   Abdominal Pain    [name] [name] is a 82 y.o. female.  HPI      82 year old female with a history of diabetes, dyslipidemia, coronary artery disease, atrial fibrillation on Coumadin, hypertension, CHF, seizures, mild cognitive impairment, CKD who presents with concern for abdominal pain  Urinary retention, was here for chest pain last week.  Was urinating normally then.  11AM today has not been able to urinate.  Feels like needs to but can't.  Has associated abdominal pain/bladder pain. Tried to urinate today and while straining had bowel movement and blood.    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   HYPERLIPIDEMIA 03/27/2006   HYPERTENSION 03/27/2006   Intertrochanteric fracture of right hip (HCC) 07/13/2012   LUNG NODULE 09/01/2006   Excision, Bx Benighn   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 Authorizing Provider  atorvastatin (LIPITOR) 80 MG tablet Take 1 tablet by mouth once daily 10/01/21   [name], MD  denosumab (PROLIA) 60 MG/ML SOSY injection Inject 60 mg into the skin every 6 (six) months.    [provider]  ezetimibe (ZETIA) 10 MG tablet Take 1 tablet (10 mg total) by mouth daily. 12/04/21   Chilton Si, MD  furosemide (LASIX) 40 MG tablet TAKE 1 TABLET BY MOUTH TWICE DAILY AS NEEDED FOR FLUID 10/01/21   [name], MD  insulin NPH-regular Human (NOVOLIN 70/30 RELION) (70-30) 100 UNIT/ML injection 26 units with breakfast and 2 units with the evening meal. 03/14/21   Romero Belling, MD  metoprolol succinate (TOPROL-XL) 50 MG 24 hr tablet TAKE 1 TABLET BY MOUTH ONCE DAILY . APPOINTMENT REQUIRED FOR FUTURE REFILLS 06/15/22   Chilton Si, MD  Multiple Vitamins-Minerals (MULTIVITAMIN ADULT PO) Take 1 tablet by mouth daily with breakfast.     [provider]  VITAMIN D, CHOLECALCIFEROL, PO Take by mouth.    [provider]  warfarin (COUMADIN) 2.5 MG tablet TAKE 1/2 TO 1 (ONE-HALF TO ONE) TABLET BY MOUTH ONCE DAILY 06/15/22   Chilton Si, MD      Allergies    Procaine hcl and Amoxicillin    Review of Systems   Review of Systems  Physical Exam Updated Vital Signs  BP 112/73 (BP Location: Right Arm)   Pulse 63   Temp 98.6 F (37 C)   Resp 20   Ht 5\' 8"  (1.727 m)   Wt 77.6 kg   SpO2 99%   BMI 26.00 kg/m  Physical Exam Vitals and nursing note reviewed.  Constitutional:      General: She is not in acute distress.    Appearance: She is well-developed. She is not diaphoretic.  HENT:     Head: Normocephalic and atraumatic.  Eyes:     Conjunctiva/sclera: Conjunctivae normal.  Cardiovascular:     Rate and Rhythm: Normal rate and regular rhythm.     Heart sounds: Normal heart sounds. No murmur heard.    No friction rub. No gallop.  Pulmonary:     Effort: Pulmonary effort  is normal. No respiratory distress.     Breath sounds: Normal breath sounds. No wheezing or rales.  Abdominal:     General: There is no distension.     Palpations: Abdomen is soft.     Tenderness: There is abdominal tenderness. There is no guarding.  Musculoskeletal:        General: No tenderness.     Cervical back: Normal range of motion.  Skin:    General: Skin is warm and dry.     Findings: No erythema or rash.  Neurological:     Mental Status: She is alert and oriented to person, place, and time.     ED Results / Procedures / Treatments   Labs (all labs ordered are listed, but only abnormal results are displayed) Labs Reviewed - No data to display  EKG None  Radiology No results found.  Procedures Procedures  {Document cardiac monitor, telemetry assessment procedure when appropriate:1}  Medications Ordered in ED Medications - No data to display  ED Course/ Medical Decision Making/ A&P   {   Click here for ABCD2, HEART and other calculatorsREFRESH Note before signing :1}                            82 year old female with a history of diabetes, dyslipidemia, coronary artery disease, atrial fibrillation on Coumadin, hypertension, CHF, seizures, mild cognitive impairment, CKD who presents with concern for abdominal pain, urinary retention, hematuria/rectal bleeding.   Presents with urinary retention. Foley catheter placed with gross hematuria present.     {Document critical care time when appropriate:1} {Document review of labs and clinical decision tools ie heart score, Chads2Vasc2 etc:1}  {Document your independent review of radiology images, and any outside records:1} {Document your discussion with family members, caretakers, and with consultants:1} {Document social determinants of health affecting pt's care:1} {Document your decision making why or why not admission, treatments were needed:1} Final Clinical Impression(s) / ED Diagnoses Final diagnoses:  None     Rx / DC Orders ED Discharge Orders     None

## 2022-07-08 DIAGNOSIS — I5022 Chronic systolic (congestive) heart failure: Secondary | ICD-10-CM | POA: Diagnosis not present

## 2022-07-08 DIAGNOSIS — N136 Pyonephrosis: Secondary | ICD-10-CM | POA: Diagnosis not present

## 2022-07-08 DIAGNOSIS — Z66 Do not resuscitate: Secondary | ICD-10-CM | POA: Diagnosis not present

## 2022-07-08 DIAGNOSIS — K219 Gastro-esophageal reflux disease without esophagitis: Secondary | ICD-10-CM | POA: Diagnosis present

## 2022-07-08 DIAGNOSIS — K649 Unspecified hemorrhoids: Secondary | ICD-10-CM | POA: Diagnosis present

## 2022-07-08 DIAGNOSIS — I480 Paroxysmal atrial fibrillation: Secondary | ICD-10-CM | POA: Diagnosis not present

## 2022-07-08 DIAGNOSIS — M8580 Other specified disorders of bone density and structure, unspecified site: Secondary | ICD-10-CM | POA: Diagnosis present

## 2022-07-08 DIAGNOSIS — I255 Ischemic cardiomyopathy: Secondary | ICD-10-CM | POA: Diagnosis present

## 2022-07-08 DIAGNOSIS — D649 Anemia, unspecified: Secondary | ICD-10-CM | POA: Diagnosis not present

## 2022-07-08 DIAGNOSIS — B9620 Unspecified Escherichia coli [E. coli] as the cause of diseases classified elsewhere: Secondary | ICD-10-CM | POA: Diagnosis not present

## 2022-07-08 DIAGNOSIS — T45515A Adverse effect of anticoagulants, initial encounter: Secondary | ICD-10-CM | POA: Diagnosis present

## 2022-07-08 DIAGNOSIS — R310 Gross hematuria: Secondary | ICD-10-CM | POA: Diagnosis present

## 2022-07-08 DIAGNOSIS — H5462 Unqualified visual loss, left eye, normal vision right eye: Secondary | ICD-10-CM | POA: Diagnosis present

## 2022-07-08 DIAGNOSIS — R0789 Other chest pain: Secondary | ICD-10-CM | POA: Diagnosis present

## 2022-07-08 DIAGNOSIS — D6832 Hemorrhagic disorder due to extrinsic circulating anticoagulants: Secondary | ICD-10-CM | POA: Diagnosis not present

## 2022-07-08 DIAGNOSIS — Z79899 Other long term (current) drug therapy: Secondary | ICD-10-CM | POA: Diagnosis not present

## 2022-07-08 DIAGNOSIS — I130 Hypertensive heart and chronic kidney disease with heart failure and stage 1 through stage 4 chronic kidney disease, or unspecified chronic kidney disease: Secondary | ICD-10-CM | POA: Diagnosis not present

## 2022-07-08 DIAGNOSIS — Z794 Long term (current) use of insulin: Secondary | ICD-10-CM | POA: Diagnosis not present

## 2022-07-08 DIAGNOSIS — E1122 Type 2 diabetes mellitus with diabetic chronic kidney disease: Secondary | ICD-10-CM | POA: Diagnosis not present

## 2022-07-08 DIAGNOSIS — N184 Chronic kidney disease, stage 4 (severe): Secondary | ICD-10-CM | POA: Diagnosis not present

## 2022-07-08 DIAGNOSIS — E872 Acidosis, unspecified: Secondary | ICD-10-CM | POA: Diagnosis not present

## 2022-07-08 DIAGNOSIS — Z9861 Coronary angioplasty status: Secondary | ICD-10-CM | POA: Diagnosis not present

## 2022-07-08 DIAGNOSIS — Z87891 Personal history of nicotine dependence: Secondary | ICD-10-CM | POA: Diagnosis not present

## 2022-07-08 DIAGNOSIS — I2510 Atherosclerotic heart disease of native coronary artery without angina pectoris: Secondary | ICD-10-CM | POA: Diagnosis present

## 2022-07-08 DIAGNOSIS — N3091 Cystitis, unspecified with hematuria: Secondary | ICD-10-CM

## 2022-07-08 DIAGNOSIS — R791 Abnormal coagulation profile: Secondary | ICD-10-CM

## 2022-07-08 DIAGNOSIS — E785 Hyperlipidemia, unspecified: Secondary | ICD-10-CM | POA: Diagnosis present

## 2022-07-08 LAB — CBC WITH DIFFERENTIAL/PLATELET
Abs Immature Granulocytes: 0.07 10*3/uL (ref 0.00–0.07)
Abs Immature Granulocytes: 0.05 10*3/uL (ref 0.00–0.07)
Basophils Absolute: 0.1 10*3/uL (ref 0.0–0.1)
Basophils Absolute: 0.1 10*3/uL (ref 0.0–0.1)
Basophils Relative: 0 %
Basophils Relative: 0 %
Eosinophils Absolute: 0.1 10*3/uL (ref 0.0–0.5)
Eosinophils Absolute: 0.1 10*3/uL (ref 0.0–0.5)
Eosinophils Relative: 1 %
Eosinophils Relative: 0 %
HCT: 29.6 % — ABNORMAL LOW (ref 36.0–46.0)
HCT: 31.5 % — ABNORMAL LOW (ref 36.0–46.0)
Hemoglobin: 9.8 g/dL — ABNORMAL LOW (ref 12.0–15.0)
Hemoglobin: 10.2 g/dL — ABNORMAL LOW (ref 12.0–15.0)
Immature Granulocytes: 1 %
Immature Granulocytes: 0 %
Lymphocytes Relative: 15 %
Lymphocytes Relative: 18 %
Lymphs Abs: 1.9 10*3/uL (ref 0.7–4.0)
Lymphs Abs: 2.3 10*3/uL (ref 0.7–4.0)
MCH: 30.1 pg (ref 26.0–34.0)
MCH: 29.7 pg (ref 26.0–34.0)
MCHC: 33.1 g/dL (ref 30.0–36.0)
MCHC: 32.4 g/dL (ref 30.0–36.0)
MCV: 90.8 fL (ref 80.0–100.0)
MCV: 91.6 fL (ref 80.0–100.0)
Monocytes Absolute: 1.3 10*3/uL — ABNORMAL HIGH (ref 0.1–1.0)
Monocytes Absolute: 1.4 10*3/uL — ABNORMAL HIGH (ref 0.1–1.0)
Monocytes Relative: 10 %
Monocytes Relative: 10 %
Neutro Abs: 9.6 10*3/uL — ABNORMAL HIGH (ref 1.7–7.7)
Neutro Abs: 9.3 10*3/uL — ABNORMAL HIGH (ref 1.7–7.7)
Neutrophils Relative %: 73 %
Neutrophils Relative %: 72 %
Platelets: 482 10*3/uL — ABNORMAL HIGH (ref 150–400)
Platelets: 495 10*3/uL — ABNORMAL HIGH (ref 150–400)
RBC: 3.26 MIL/uL — ABNORMAL LOW (ref 3.87–5.11)
RBC: 3.44 MIL/uL — ABNORMAL LOW (ref 3.87–5.11)
RDW: 12.6 % (ref 11.5–15.5)
RDW: 12.8 % (ref 11.5–15.5)
WBC: 13.1 10*3/uL — ABNORMAL HIGH (ref 4.0–10.5)
WBC: 13.1 10*3/uL — ABNORMAL HIGH (ref 4.0–10.5)
nRBC: 0 % (ref 0.0–0.2)
nRBC: 0 % (ref 0.0–0.2)

## 2022-07-08 LAB — BASIC METABOLIC PANEL
Anion gap: 9 (ref 5–15)
BUN: 29 mg/dL — ABNORMAL HIGH (ref 8–23)
CO2: 27 mmol/L (ref 22–32)
Calcium: 8.1 mg/dL — ABNORMAL LOW (ref 8.9–10.3)
Chloride: 100 mmol/L (ref 98–111)
Creatinine, Ser: 1.74 mg/dL — ABNORMAL HIGH (ref 0.44–1.00)
GFR, Estimated: 29 mL/min — ABNORMAL LOW (ref >60)
Glucose, Bld: 180 mg/dL — ABNORMAL HIGH (ref 70–99)
Potassium: 4.6 mmol/L (ref 3.5–5.1)
Sodium: 136 mmol/L (ref 135–145)

## 2022-07-08 LAB — LACTIC ACID, PLASMA
Lactic Acid, Venous: 2.4 mmol/L (ref 0.5–1.9)
Lactic Acid, Venous: 1.7 mmol/L (ref 0.5–1.9)

## 2022-07-08 LAB — URINE CULTURE

## 2022-07-08 LAB — PROTIME-INR
INR: 3.7 — ABNORMAL HIGH (ref 0.8–1.2)
INR: 2.3 — ABNORMAL HIGH (ref 0.8–1.2)
Prothrombin Time: 37.2 seconds — ABNORMAL HIGH (ref 11.4–15.2)
Prothrombin Time: 25.2 seconds — ABNORMAL HIGH (ref 11.4–15.2)

## 2022-07-08 LAB — GLUCOSE, CAPILLARY
Glucose-Capillary: 200 mg/dL — ABNORMAL HIGH (ref 70–99)
Glucose-Capillary: 193 mg/dL — ABNORMAL HIGH (ref 70–99)
Glucose-Capillary: 173 mg/dL — ABNORMAL HIGH (ref 70–99)

## 2022-07-08 MED ORDER — VITAMIN K1 10 MG/ML IJ SOLN
5.0000 mg | Freq: Once | INTRAVENOUS | Status: AC
  Administered 2022-07-08: 5 mg via INTRAVENOUS
  Filled 2022-07-08: qty 0.5

## 2022-07-08 MED ORDER — INSULIN ASPART 100 UNIT/ML IJ SOLN
0.0000 [IU] | Freq: Three times a day (TID) | INTRAMUSCULAR | Status: DC
  Administered 2022-07-08 (×2): 3 [IU] via SUBCUTANEOUS
  Administered 2022-07-09: 5 [IU] via SUBCUTANEOUS
  Administered 2022-07-09 – 2022-07-10 (×3): 2 [IU] via SUBCUTANEOUS

## 2022-07-08 MED ORDER — CHLORHEXIDINE GLUCONATE CLOTH 2 % EX PADS
6.0000 | MEDICATED_PAD | Freq: Every day | CUTANEOUS | Status: DC
  Administered 2022-07-08 – 2022-07-10 (×3): 6 via TOPICAL

## 2022-07-08 MED ORDER — METOPROLOL SUCCINATE ER 50 MG PO TB24
50.0000 mg | ORAL_TABLET | Freq: Every day | ORAL | Status: DC
  Administered 2022-07-08 – 2022-07-10 (×3): 50 mg via ORAL
  Filled 2022-07-08 (×3): qty 1

## 2022-07-08 MED ORDER — SENNOSIDES-DOCUSATE SODIUM 8.6-50 MG PO TABS: 1.0000 | ORAL_TABLET | Freq: Every evening | ORAL | Status: DC | PRN

## 2022-07-08 MED ORDER — INSULIN ASPART PROT & ASPART (70-30 MIX) 100 UNIT/ML ~~LOC~~ SUSP
10.0000 [IU] | Freq: Every day | SUBCUTANEOUS | Status: DC
  Administered 2022-07-09 – 2022-07-10 (×2): 10 [IU] via SUBCUTANEOUS
  Filled 2022-07-08: qty 10

## 2022-07-08 MED ORDER — DIPHENHYDRAMINE HCL 25 MG PO CAPS: 50.0000 mg | ORAL_CAPSULE | ORAL | Status: DC | PRN

## 2022-07-08 MED ORDER — ONDANSETRON HCL 4 MG/2ML IJ SOLN: 4.0000 mg | Freq: Four times a day (QID) | INTRAMUSCULAR | Status: DC | PRN

## 2022-07-08 MED ORDER — TRAZODONE HCL 50 MG PO TABS: 25.0000 mg | ORAL_TABLET | Freq: Every evening | ORAL | Status: DC | PRN

## 2022-07-08 MED ORDER — SODIUM CHLORIDE 0.9 % IR SOLN
3000.0000 mL | Status: DC
  Administered 2022-07-08 – 2022-07-09 (×2): 3000 mL

## 2022-07-08 MED ORDER — ATORVASTATIN CALCIUM 40 MG PO TABS
80.0000 mg | ORAL_TABLET | Freq: Every day | ORAL | Status: DC
  Administered 2022-07-08 – 2022-07-10 (×3): 80 mg via ORAL
  Filled 2022-07-08 (×3): qty 2

## 2022-07-08 MED ORDER — INSULIN ASPART 100 UNIT/ML IJ SOLN: 0.0000 [IU] | Freq: Every day | INTRAMUSCULAR | Status: DC

## 2022-07-08 MED ORDER — OXYBUTYNIN CHLORIDE 5 MG PO TABS: 5.0000 mg | ORAL_TABLET | Freq: Three times a day (TID) | ORAL | Status: DC | PRN

## 2022-07-08 MED ORDER — SODIUM CHLORIDE 0.9 % IV SOLN
1.0000 g | Freq: Two times a day (BID) | INTRAVENOUS | Status: DC
  Administered 2022-07-08 – 2022-07-10 (×5): 1 g via INTRAVENOUS
  Filled 2022-07-08 (×5): qty 20

## 2022-07-08 MED ORDER — ACETAMINOPHEN 325 MG PO TABS: 650.0000 mg | ORAL_TABLET | Freq: Four times a day (QID) | ORAL | Status: DC | PRN

## 2022-07-08 MED ORDER — ALBUTEROL SULFATE (2.5 MG/3ML) 0.083% IN NEBU: 2.5000 mg | INHALATION_SOLUTION | RESPIRATORY_TRACT | Status: DC | PRN

## 2022-07-08 MED ORDER — EZETIMIBE 10 MG PO TABS
10.0000 mg | ORAL_TABLET | Freq: Every day | ORAL | Status: DC
  Administered 2022-07-08 – 2022-07-10 (×3): 10 mg via ORAL
  Filled 2022-07-08 (×3): qty 1

## 2022-07-08 MED ORDER — ONDANSETRON HCL 4 MG PO TABS: 4.0000 mg | ORAL_TABLET | Freq: Four times a day (QID) | ORAL | Status: DC | PRN

## 2022-07-08 MED ORDER — ACETAMINOPHEN 650 MG RE SUPP: 650.0000 mg | Freq: Four times a day (QID) | RECTAL | Status: DC | PRN

## 2022-07-08 MED ORDER — SODIUM CHLORIDE 0.9 % IV BOLUS
1000.0000 mL | Freq: Once | INTRAVENOUS | Status: AC
  Administered 2022-07-08: 1000 mL via INTRAVENOUS

## 2022-07-08 NOTE — ED Notes (Signed)
Still awaiting materials for CBI from courier.

## 2022-07-08 NOTE — ED Notes (Signed)
[facility] called for ED to ED transfer. MD Palumbo accepting at [facility] ED.

## 2022-07-08 NOTE — ED Notes (Signed)
[facility] ED not able to accept.

## 2022-07-08 NOTE — ED Notes (Signed)
Foley flushed several times and large amount of clots removed, CBI started at 255am

## 2022-07-08 NOTE — H&P (Addendum)
History and Physical  [name] WGN:[id number] DOB: 1941-01-04 DOA: 07/07/2022  PCP: [name], MD   Chief Complaint: bloody urine   HPI: [name] [name] is a 82 y.o. female with medical history significant for ESBL UTI, insulin-dependent type 2 diabetes, ischemic cardiomyopathy, CKD stage III, paroxysmal atrial fibrillation on Coumadin being admitted to the hospital with hemorrhagic cystitis in the setting of supratherapeutic INR.  Her INR was found to be 7.9 on 5/6, was told to hold her warfarin.  She then started having difficulty voiding, and blood in her urine.  She presented to med [facility], where INR was found to be 5.2, hemoglobin 11.1 stable, WBC 14.8, creatinine 1.83 slightly up from baseline.  CT findings consistent with hemorrhagic cystitis with left hydroureteronephrosis.  Urology was consulted, urine was cultured, she was given IV Rocephin.  Three-way catheter was placed in the emergency department, and the patient has been transferred to the hospitalist service here at [facility] long with urology consultation.  She denies any orthopnea, fevers, nausea, vomiting, diarrhea, abdominal or pelvic pain.  No chest pain.  No cough or shortness of breath.  Review of Systems: Please see HPI for pertinent positives and negatives. A complete 10 system review of systems are otherwise negative.  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   HYPERLIPIDEMIA 03/27/2006   HYPERTENSION 03/27/2006   Intertrochanteric fracture of right  hip (HCC) 07/13/2012   LUNG NODULE 09/01/2006   Excision, Bx Benighn   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"   Osteopenia 2004   Dexa 2004 showed Osteopenia, DEXA 03/2007 normal   Osteopenia    Other chronic cystitis with hematuria    Pelvic fracture (HCC) 06/16/2016   S/P fall   Recurrent urinary tract infection    Seeing Urology   RETINOPATHY, BACKGROUND NOS 03/27/2006   Seizures (HCC)    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   Past Surgical History:  Procedure Laterality Date   ANKLE FRACTURE SURGERY Bilateral 08/2015   "right one was in 2 places"   CARDIAC CATHETERIZATION N/A 05/17/2015   Procedure: Left Heart Cath and Coronary Angiography;  Surgeon: Corky Crafts, MD;  Location: [facility] INVASIVE CV LAB;  Service: Cardiovascular;  Laterality: N/A;   CARDIAC CATHETERIZATION N/A 05/17/2015   Procedure: Coronary Balloon Angioplasty;  Surgeon: Corky Crafts, MD;  Location: [facility] INVASIVE CV LAB;  Service: Cardiovascular;  Laterality: N/A;   FEMUR IM NAIL Right 07/15/2012   Procedure: INTRAMEDULLARY (IM) NAIL HIP;  Surgeon: Eulas Post, MD;  Location: [facility] OR;  Service: Orthopedics;  Laterality: Right;   FEMUR IM NAIL Left 12/02/2015   Procedure: INTRAMEDULLARY (IM) NAIL FEMORAL;  Surgeon: Eldred Manges, MD;  Location: [facility] OR;  Service: Orthopedics;  Laterality: Left;   FRACTURE SURGERY     INCISION AND DRAINAGE HIP Left 01/03/2016   Procedure: IRRIGATION AND DEBRIDEMENT HIP;  Surgeon: Eldred Manges, MD;  Location: [facility] OR;  Service: Orthopedics;  Laterality:  Left;   SHOULDER SURGERY Bilateral    "don't remember which side"    Social History:  reports that she quit smoking about 44 years ago. Her smoking use included cigarettes. She has never used smokeless tobacco. She reports that she does not drink alcohol and does not use drugs.   Allergies  Allergen Reactions   Procaine Hcl Anaphylaxis   Amoxicillin Itching     Did it involve swelling of the face/tongue/throat, SOB, or low BP?  No Did it involve sudden or severe rash/hives, skin peeling, or any reaction on the inside of your mouth or nose? No Did you need to seek medical attention at a hospital or doctor's office? No When did it last happen? unk If all above answers are "NO", may proceed with cephalosporin use.     Family History  Problem Relation Age of Onset   Diabetes Father    Heart attack Father 33   Diabetes Sister    Drug abuse Son    Cancer Neg Hx        no hx of colon or breast cancer     Prior to Admission medications   Medication Sig Start Date End Date Taking? Authorizing Provider  atorvastatin (LIPITOR) 80 MG tablet Take 1 tablet by mouth once daily 10/01/21   [name], MD  denosumab (PROLIA) 60 MG/ML SOSY injection Inject 60 mg into the skin every 6 (six) months.    [provider]  ezetimibe (ZETIA) 10 MG tablet Take 1 tablet (10 mg total) by mouth daily. 12/04/21   Chilton Si, MD  furosemide (LASIX) 40 MG tablet TAKE 1 TABLET BY MOUTH TWICE DAILY AS NEEDED FOR FLUID 10/01/21   [name], MD  insulin NPH-regular Human (NOVOLIN 70/30 RELION) (70-30) 100 UNIT/ML injection 26 units with breakfast and 2 units with the evening meal. 03/14/21   Romero Belling, MD  metoprolol succinate (TOPROL-XL) 50 MG 24 hr tablet TAKE 1 TABLET BY MOUTH ONCE DAILY . APPOINTMENT REQUIRED FOR FUTURE REFILLS 06/15/22   Chilton Si, MD  Multiple Vitamins-Minerals (MULTIVITAMIN ADULT PO) Take 1 tablet by mouth daily with breakfast.     [provider]  VITAMIN D, CHOLECALCIFEROL, PO Take by mouth.    [provider]  warfarin (COUMADIN) 2.5 MG tablet TAKE 1/2 TO 1 (ONE-HALF TO ONE) TABLET BY MOUTH ONCE DAILY 06/15/22   Chilton Si, MD    Physical Exam: BP (!) 153/51 (BP Location: Right Arm)   Pulse 65   Temp (!) 97.5 F (36.4 C) (Oral)   Resp 17   Ht 5\' 8"  (1.727 m)   Wt 77.6 kg   SpO2 100%   BMI 26.00 kg/m   General:  Alert, oriented, calm, in no acute distress   Eyes: EOMI, clear conjuctivae, white sclerea Neck: supple, no masses, trachea mildline  Cardiovascular: RRR, no murmurs or rubs, no peripheral edema  Respiratory: clear to auscultation bilaterally, no wheezes, no crackles  Abdomen: soft, nontender, nondistended, normal bowel tones heard  Skin: dry, no rashes  GU: CBI in place, Red Kool-Aid urine in Foley Musculoskeletal: no joint effusions, normal range of motion  Psychiatric: appropriate affect, normal speech  Neurologic: extraocular muscles intact, clear speech, moving all extremities with intact sensorium          Labs on Admission:  Basic Metabolic Panel: Recent Labs  Lab 07/07/22 1637 07/08/22 0633  NA 136 136  K 4.0 4.6  CL 97* 100  CO2 26 27  GLUCOSE 147*  180*  BUN 29* 29*  CREATININE 1.83* 1.74*  CALCIUM 8.5* 8.1*   Liver Function Tests: Recent Labs  Lab 07/07/22 1637  AST 39  ALT 19  ALKPHOS 79  BILITOT 0.7  PROT 7.3  ALBUMIN 2.7*   No results for input(s): "LIPASE", "AMYLASE" in the last 168 hours. No results for input(s): "AMMONIA" in the last 168 hours. CBC: Recent Labs  Lab 07/07/22 1637 07/08/22 0104 07/08/22 0633  WBC 14.8* 13.1* 13.1*  NEUTROABS 11.0* 9.6* 9.3*  HGB 11.1* 9.8* 10.2*  HCT 34.1* 29.6* 31.5*  MCV 92.2 90.8 91.6  PLT 574* 482* 495*   Cardiac Enzymes: No results for input(s): "CKTOTAL", "CKMB", "CKMBINDEX", "TROPONINI" in the last 168 hours.  BNP (last 3 results) No results for input(s): "BNP" in the last 8760 hours.  ProBNP (last 3 results) No results for input(s): "PROBNP" in the last 8760 hours.  CBG: No results for input(s): "GLUCAP" in the last 168 hours.  Radiological Exams on Admission: CT Renal Stone Study  Result Date: 07/07/2022 CLINICAL DATA:  82 year old female with abdominal/flank pain and decreased urine output. Stone suspected. Hematuria. EXAM: CT ABDOMEN AND PELVIS WITHOUT CONTRAST TECHNIQUE: Multidetector CT imaging of the abdomen and pelvis was  performed following the standard protocol without IV contrast. RADIATION DOSE REDUCTION: This exam was performed according to the departmental dose-optimization program which includes automated exposure control, adjustment of the mA and/or kV according to patient size and/or use of iterative reconstruction technique. COMPARISON:  CT abdomen and pelvis 01/20/2022 FINDINGS: Lower chest: No acute abnormality.  Mitral annular calcification. Hepatobiliary: Cholelithiasis. No evidence of cholecystitis. No focal hepatic lesion on noncontrast exam. Pancreas: No acute abnormality. Atrophic. Stable cystic lesions in the pancreatic tail measuring up to 1.3 cm. Spleen: Unremarkable. Adrenals/Urinary Tract: Stable adrenal glands. Bilateral cortical renal scarring. Asymmetric left perinephric stranding prominent bilateral renal pelves. Nonobstructing punctate left nephrolithiasis. Moderate left hydroureteronephrosis. Heterogenous high density fluid within the bladder. Foley catheter balloon in the bladder. Gas within the bladder. Diffuse bladder wall thickening with adjacent perivesicular stranding. There are a few calcific densities within the bladder. Stomach/Bowel: Normal caliber large and small bowel. Colonic diverticulosis without diverticulitis. Normal appendix. Small hiatal hernia. Stomach is within normal limits. Vascular/Lymphatic: Extensive aortic atherosclerotic calcification. No lymphadenopathy. Reproductive: Uterus and bilateral adnexa are unremarkable. Other: Smaller anterior toward in the pelvis. No free intraperitoneal air. Musculoskeletal: Bilateral postoperative changes about the proximal femurs. No acute osseous abnormality. Chronic T11 and L1 compression fractures. Demineralization. Thoracolumbar spondylosis. IMPRESSION: 1. Constellation of findings suggestive of hemorrhagic cystitis. Asymmetric stranding about the left kidney with left hydroureteronephrosis could be due to obstruction from clot within the  bladder or pyelonephritis. Underlying bladder malignancy not excluded. Urology consultation recommended. 2. Nonobstructing punctate left nephrolithiasis. 3. Cholelithiasis. 4. Colonic diverticulosis without diverticulitis. 5. Stable cystic lesions in the pancreatic tail measuring up to 1.3 cm. Aortic Atherosclerosis (ICD10-I70.0). Electronically Signed   By: Minerva Fester M.D.   On: 07/07/2022 18:27    Assessment/Plan Gross hematuria due to Hemorrhagic Cystitis in the setting of supratherapeutic INR. High INR could be due to acute infection. She is hemodynamically stable, technically meeting SIRS criteria. - observation admission - empiric Meropenem given Hx of ESBL, discussed with pharmacy staff - follow urine cultures, will add blood cultures - check lactate now - given continued gross hematuria, will give 5mg  IV phytonadione now and recheck INR this afternoon and again in AM - messaged Urology Dr. Pete Glatter who will consult this AM - keep patient NPO  in the mean time  Active Problems:   Essential hypertension - continue home metoprolol   GERD (gastroesophageal reflux disease)   CAD S/P percutaneous coronary angioplasty - continue home Toprol XL 50mg  PO daily   PAF (paroxysmal atrial fibrillation) (HCC) - currently in NSR   Cardiomyopathy (HCC) - continue appropriate meds, currently no evidence of acute heart failure HLD - continue home Zetia and Lipitor   CKD (chronic kidney disease) stage 4, GFR 15-29 ml/min (HCC) - avoid nephrotoxins as able and renally dose medications. Follow renal function daily.  DVT prophylaxis: SCDs only due to bleeding  Full Code - was previously documented as DNR but upon discussion with patient she would like to be Full Code at this time.  Consults called: Urology was contacted by ER, messages sent to Dr. Delanna Ahmadi (urology resident) and Dr. Pete Glatter attending on call.   Admission status: Observation   Time spent: 49 minutes  Nelissa Bolduc Sharlette Dense MD [facility] Pager [phone]  If 7PM-7AM, please contact night-coverage [email or web address] Password [facility]  07/08/2022, 9:44 AM

## 2022-07-08 NOTE — ED Notes (Signed)
Attempting ER- ER transfer for CBI

## 2022-07-08 NOTE — Consult Note (Addendum)
Urology Consult  Referring physician: Dr. Bryn Gulling Reason for referral: Hematuria  Chief Complaint: Hematuria  History of Present Illness: [name] [name] is an 82 year old female presenting to [facility] in transfer from [facility] where she was found to have supratherapeutic INR to 5.2 with ongoing hemorrhagic cystitis and left-sided hydroureteronephrosis.  PMH significant for ESBL UTI, T2DM, ischemic cardiomyopathy, CKD stage III, and PAF on chronic Coumadin.  [facility] was consulted to address abnormal CT findings and hematuria.  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   HYPERLIPIDEMIA 03/27/2006   HYPERTENSION 03/27/2006   Intertrochanteric fracture of right hip (HCC) 07/13/2012   LUNG NODULE 09/01/2006   Excision, Bx Benighn   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"   Osteopenia 2004   Dexa 2004 showed Osteopenia, DEXA 03/2007 normal   Osteopenia    Other chronic cystitis with hematuria    Pelvic fracture (HCC) 06/16/2016   S/P fall   Recurrent urinary tract infection    Seeing Urology   RETINOPATHY, BACKGROUND NOS 03/27/2006   Seizures (HCC)    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   Past Surgical History:  Procedure Laterality Date   ANKLE FRACTURE SURGERY Bilateral 08/2015   "right one was in 2 places"   CARDIAC CATHETERIZATION N/A 05/17/2015   Procedure: Left Heart Cath and Coronary Angiography;  Surgeon: Corky Crafts, MD;  Location: [facility] INVASIVE CV LAB;  Service: Cardiovascular;   Laterality: N/A;   CARDIAC CATHETERIZATION N/A 05/17/2015   Procedure: Coronary Balloon Angioplasty;  Surgeon: Corky Crafts, MD;  Location: [facility] INVASIVE CV LAB;  Service: Cardiovascular;  Laterality: N/A;   FEMUR IM NAIL Right 07/15/2012   Procedure: INTRAMEDULLARY (IM) NAIL HIP;  Surgeon: Eulas Post, MD;  Location: [facility] OR;  Service: Orthopedics;  Laterality: Right;   FEMUR IM NAIL Left 12/02/2015   Procedure: INTRAMEDULLARY (IM) NAIL FEMORAL;  Surgeon: Eldred Manges, MD;  Location: [facility] OR;  Service: Orthopedics;  Laterality: Left;   FRACTURE SURGERY     INCISION AND DRAINAGE HIP Left 01/03/2016   Procedure: IRRIGATION AND DEBRIDEMENT HIP;  Surgeon: Eldred Manges, MD;  Location: [facility] OR;  Service: Orthopedics;  Laterality: Left;   SHOULDER SURGERY Bilateral    "don't remember which side"     Allergies:  Allergies  Allergen Reactions   Procaine Hcl Anaphylaxis   Amoxicillin Itching     Did it involve swelling of the face/tongue/throat, SOB, or low BP? No Did it involve sudden or severe rash/hives, skin peeling, or any reaction on the inside of your mouth or nose? No Did you need to seek medical attention at a hospital or doctor's office? No When did it last happen? unk If all above answers are "NO", may proceed with cephalosporin use.     Family History  Problem Relation Age of Onset   Diabetes Father    Heart attack Father 33   Diabetes Sister    Drug abuse  Son    Cancer Neg Hx        no hx of colon or breast cancer   Social History:  reports that she quit smoking about 44 years ago. Her smoking use included cigarettes. She has never used smokeless tobacco. She reports that she does not drink alcohol and does not use drugs.  Review of Systems  Genitourinary:  Positive for hematuria.     Physical Exam:  Vital signs in last 24 hours: Temp:  [97.5 F (36.4 C)-98.6 F (37 C)] 97.5 F (36.4 C) (05/08 0910) Pulse Rate:  [56-69] 65 (05/08 0910) Resp:  [10-20] 17 (05/08  0910) BP: (112-173)/(43-73) 153/51 (05/08 0910) SpO2:  [90 %-100 %] 100 % (05/08 0910) Weight:  [77.6 kg] 77.6 kg (05/07 1630)  Cardiovascular: Skin warm; not flushed Respiratory: Breaths quiet; no shortness of breath Abdomen: No masses, soft, no guarding or rebound Neurological: Normal sensation to touch Musculoskeletal: Normal motor function arms and legs Skin: No rashes Genitourinary:78f 3-way foley in place with CBI on low-med gtt  Laboratory Data:  Results for orders placed or performed during the hospital encounter of 07/07/22 (from the past 72 hour(s))  CBC with Differential     Status: Abnormal   Collection Time: 07/07/22  4:37 PM  Result Value Ref Range   WBC 14.8 (H) 4.0 - 10.5 K/uL   RBC 3.70 (L) 3.87 - 5.11 MIL/uL   Hemoglobin 11.1 (L) 12.0 - 15.0 g/dL   HCT 57.8 (L) 46.9 - 62.9 %   MCV 92.2 80.0 - 100.0 fL   MCH 30.0 26.0 - 34.0 pg   MCHC 32.6 30.0 - 36.0 g/dL   RDW 52.8 41.3 - 24.4 %   Platelets 574 (H) 150 - 400 K/uL   nRBC 0.0 0.0 - 0.2 %   Neutrophils Relative % 75 %   Neutro Abs 11.0 (H) 1.7 - 7.7 K/uL   Lymphocytes Relative 14 %   Lymphs Abs 2.1 0.7 - 4.0 K/uL   Monocytes Relative 10 %   Monocytes Absolute 1.5 (H) 0.1 - 1.0 K/uL   Eosinophils Relative 1 %   Eosinophils Absolute 0.1 0.0 - 0.5 K/uL   Basophils Relative 0 %   Basophils Absolute 0.0 0.0 - 0.1 K/uL   Immature Granulocytes 0 %   Abs Immature Granulocytes 0.06 0.00 - 0.07 K/uL    Comment: Performed at [facility], [address] [place], [place] [address]  Comprehensive metabolic panel     Status: Abnormal   Collection Time: 07/07/22  4:37 PM  Result Value Ref Range   Sodium 136 135 - 145 mmol/L   Potassium 4.0 3.5 - 5.1 mmol/L   Chloride 97 (L) 98 - 111 mmol/L   CO2 26 22 - 32 mmol/L   Glucose, Bld 147 (H) 70 - 99 mg/dL    Comment: Glucose reference range applies only to samples taken after fasting for at least 8 hours.   BUN 29 (H) 8 - 23 mg/dL   Creatinine, Ser 2.53 (H)  0.44 - 1.00 mg/dL   Calcium 8.5 (L) 8.9 - 10.3 mg/dL   Total Protein 7.3 6.5 - 8.1 g/dL   Albumin 2.7 (L) 3.5 - 5.0 g/dL   AST 39 15 - 41 U/L   ALT 19 0 - 44 U/L   Alkaline Phosphatase 79 38 - 126 U/L   Total Bilirubin 0.7 0.3 - 1.2 mg/dL   GFR, Estimated 28 (L) >60 mL/min    Comment: (NOTE) Calculated  using the CKD-EPI Creatinine Equation (2021)    Anion gap 13 5 - 15    Comment: Performed at [facility], [address] [place], [place] [address]  Protime-INR     Status: Abnormal   Collection Time: 07/07/22  4:37 PM  Result Value Ref Range   Prothrombin Time 47.8 (H) 11.4 - 15.2 seconds   INR 5.2 (HH) 0.8 - 1.2    Comment: REPEATED TO VERIFY CRITICAL RESULT CALLED TO, READ BACK BY AND VERIFIED WITH: A. NOAH RN AT 1825 ON 07/07/22 BY [email or web address] (NOTE) INR goal varies based on device and disease states. Performed at [facility], [address] [place], [place] [address]   Urinalysis, w/ Reflex to Culture (Infection Suspected) -Urine, Clean Catch     Status: Abnormal   Collection Time: 07/07/22  4:37 PM  Result Value Ref Range   Specimen Source URINE, CLEAN CATCH    Color, Urine RED (A) YELLOW    Comment: BIOCHEMICALS MAY BE AFFECTED BY COLOR   APPearance TURBID (A) CLEAR   Specific Gravity, Urine  1.005 - 1.030    TEST NOT REPORTED DUE TO COLOR INTERFERENCE OF URINE PIGMENT   pH  5.0 - 8.0    TEST NOT REPORTED DUE TO COLOR INTERFERENCE OF URINE PIGMENT   Glucose, UA (A) NEGATIVE mg/dL    TEST NOT REPORTED DUE TO COLOR INTERFERENCE OF URINE PIGMENT   Hgb urine dipstick (A) NEGATIVE    TEST NOT REPORTED DUE TO COLOR INTERFERENCE OF URINE PIGMENT   Bilirubin Urine (A) NEGATIVE    TEST NOT REPORTED DUE TO COLOR INTERFERENCE OF URINE PIGMENT   Ketones, ur (A) NEGATIVE mg/dL    TEST NOT REPORTED DUE TO COLOR INTERFERENCE OF URINE PIGMENT   Protein, ur (A) NEGATIVE mg/dL    TEST NOT REPORTED DUE TO COLOR INTERFERENCE OF URINE PIGMENT   Nitrite (A) NEGATIVE     TEST NOT REPORTED DUE TO COLOR INTERFERENCE OF URINE PIGMENT   Leukocytes,Ua (A) NEGATIVE    TEST NOT REPORTED DUE TO COLOR INTERFERENCE OF URINE PIGMENT   Squamous Epithelial / HPF 6-10 0 - 5 /HPF   WBC, UA 11-20 0 - 5 WBC/hpf    Comment: Reflex urine culture not performed if WBC <=10, OR if Squamous epithelial cells >5. If Squamous epithelial cells >5, suggest recollection.   RBC / HPF >50 0 - 5 RBC/hpf   Bacteria, UA MANY (A) NONE SEEN    Comment: Performed at [facility], [address] [place], [place] [address]  Occult blood card to lab, stool Provider will collect     Status: None   Collection Time: 07/07/22  7:16 PM  Result Value Ref Range   Fecal Occult Bld NEGATIVE NEGATIVE    Comment: Performed at [facility], [address] [place], [place] [address]  CBC with Differential     Status: Abnormal   Collection Time: 07/08/22  1:04 AM  Result Value Ref Range   WBC 13.1 (H) 4.0 - 10.5 K/uL   RBC 3.26 (L) 3.87 - 5.11 MIL/uL   Hemoglobin 9.8 (L) 12.0 - 15.0 g/dL   HCT 29.5 (L) 62.1 - 30.8 %   MCV 90.8 80.0 - 100.0 fL   MCH 30.1 26.0 - 34.0 pg   MCHC 33.1 30.0 - 36.0 g/dL   RDW 65.7 84.6 - 96.2 %   Platelets 482 (H) 150 - 400 K/uL   nRBC  0.0 0.0 - 0.2 %   Neutrophils Relative % 73 %   Neutro Abs 9.6 (H) 1.7 - 7.7 K/uL   Lymphocytes Relative 15 %   Lymphs Abs 1.9 0.7 - 4.0 K/uL   Monocytes Relative 10 %   Monocytes Absolute 1.3 (H) 0.1 - 1.0 K/uL   Eosinophils Relative 1 %   Eosinophils Absolute 0.1 0.0 - 0.5 K/uL   Basophils Relative 0 %   Basophils Absolute 0.1 0.0 - 0.1 K/uL   Immature Granulocytes 1 %   Abs Immature Granulocytes 0.07 0.00 - 0.07 K/uL    Comment: Performed at [facility], [address] [place], [place] [address]  Basic metabolic panel     Status: Abnormal   Collection Time: 07/08/22  6:33 AM  Result Value Ref Range   Sodium 136 135 - 145 mmol/L   Potassium 4.6 3.5 - 5.1 mmol/L   Chloride 100 98 - 111 mmol/L    CO2 27 22 - 32 mmol/L   Glucose, Bld 180 (H) 70 - 99 mg/dL    Comment: Glucose reference range applies only to samples taken after fasting for at least 8 hours.   BUN 29 (H) 8 - 23 mg/dL   Creatinine, Ser 1.91 (H) 0.44 - 1.00 mg/dL   Calcium 8.1 (L) 8.9 - 10.3 mg/dL   GFR, Estimated 29 (L) >60 mL/min    Comment: (NOTE) Calculated using the CKD-EPI Creatinine Equation (2021)    Anion gap 9 5 - 15    Comment: Performed at [facility], [address] [place], [place] [address]  CBC with Differential     Status: Abnormal   Collection Time: 07/08/22  6:33 AM  Result Value Ref Range   WBC 13.1 (H) 4.0 - 10.5 K/uL   RBC 3.44 (L) 3.87 - 5.11 MIL/uL   Hemoglobin 10.2 (L) 12.0 - 15.0 g/dL   HCT 56.2 (L) 13.0 - 86.5 %   MCV 91.6 80.0 - 100.0 fL   MCH 29.7 26.0 - 34.0 pg   MCHC 32.4 30.0 - 36.0 g/dL   RDW 78.4 69.6 - 29.5 %   Platelets 495 (H) 150 - 400 K/uL   nRBC 0.0 0.0 - 0.2 %   Neutrophils Relative % 72 %   Neutro Abs 9.3 (H) 1.7 - 7.7 K/uL   Lymphocytes Relative 18 %   Lymphs Abs 2.3 0.7 - 4.0 K/uL   Monocytes Relative 10 %   Monocytes Absolute 1.4 (H) 0.1 - 1.0 K/uL   Eosinophils Relative 0 %   Eosinophils Absolute 0.1 0.0 - 0.5 K/uL   Basophils Relative 0 %   Basophils Absolute 0.1 0.0 - 0.1 K/uL   Immature Granulocytes 0 %   Abs Immature Granulocytes 0.05 0.00 - 0.07 K/uL    Comment: Performed at [facility], [address] [place], [place] [address]  Protime-INR     Status: Abnormal   Collection Time: 07/08/22  6:33 AM  Result Value Ref Range   Prothrombin Time 37.2 (H) 11.4 - 15.2 seconds   INR 3.7 (H) 0.8 - 1.2    Comment: (NOTE) INR goal varies based on device and disease states. Performed at [facility], [address] [place], [place] [address]    *Note: Due to a large number of results and/or encounters for the requested time period, some results have not been displayed. A complete set of results can be found in Results  Review.  No results found for this or any previous visit (from the past 240 hour(s)). Creatinine: Recent Labs    07/07/22 1637 07/08/22 0633  CREATININE 1.83* 1.74*    Imaging: On independent review left-sided hydroureteronephrosis is noted to the level of the bladder. A couple of calcifications are noted but do not appear to be within the ureter  Foley catheter is in place.  There are a few locules of gas that appear to be within the bladder wall, representing emphysematous cystitis.  Curiously there are many locules of gas suspended within what ever material was filling the bladder at time of imaging.  This would be presumably blood clots but gas trapping within clot material is not an expected finding.     Impression/Assessment:  Appears to be hemorrhagic cystitis in the context of supratherapeutic INR.    Plan:  Since there looks to be some some gas within the bladder wall, would recommend keeping Foley catheter in place for about a week to maximize drainage and decompression.  I would recommend 2-week course of culture directed antibiotic therapy with repeat imaging on follow-up.  Will need outpatient cystoscopy as well.  Vit K per primary team  Reviewed CBI titration with nursing  Hand irrigated with about 25cc of clot material evacuated at bedside.  Daily CBC  We will continue to follow along   Scherrie Bateman [place] 07/08/2022, 10:20 AM  Pager: [phone]   Chart reviewed and patient examined.   Labs and imaging results reviewed. Agree with above. CT scan shows significant clot within the bladder with vesicle stranding and diffuse bladder wall thickening, and moderate left hydroureteronephrosis.  There appears to be locules of gas possibly within the bladder wall suggestive of emphysematous cystitis.  There are also multiple locules of gas within the clot material. Urine culture growing >100 K E. coli. INR gradually improving with vitamin K.  A 24 French hematuria  catheter was placed under sterile conditions at the bedside.  30 mL of sterile water was placed into the balloon.  The catheter was irrigated with sterile saline with return of a large volume of clot.  CBI was restarted and the urine was light pink in color.  The patient tolerated the procedure well.  Continue CBI with hand irrigation as needed. Continue broad-spectrum antibiotics pending results of culture. Continue correction of INR. Patient may ultimately require cystoscopy with clot evacuation, possible TURBT and fulguration of bleeding.  Ideally the patient would be appropriately treated for her UTI and have a normal INR prior to any surgical intervention. Will follow.  Milderd Meager, MD [facility]rology [facility] [phone]

## 2022-07-08 NOTE — ED Provider Notes (Signed)
Patient here for evaluation of hematuria, care assumed pending bed availability at [facility].  Initially attempted to transfer ED to ED for continuous bladder irrigation but there are currently no ED beds available.  We were able to courier supplies to perform CBI awaiting bed availability.  Patient did receive 6000 cc of CBI in the emergency department with persistent bloody urine.  Plan to transfer ED to ED so she can have urologic evaluation in the emergency department while she awaits her inpatient bed.  Urology team would like notification when she arrives in the emergency department.   Tilden Fossa, MD 07/08/22 [phone]

## 2022-07-08 NOTE — ED Notes (Signed)
CBI total for shift 6000 ml  input and 6000 ml output

## 2022-07-08 NOTE — ED Notes (Signed)
[employer] (courier) has been scheduled to deliver CBI materials.

## 2022-07-08 NOTE — ED Notes (Addendum)
Drinda Butts, charge RN attempting to order [employer] as well as attempting to arrange courier to bring the supplies here to [facility].

## 2022-07-08 NOTE — ED Notes (Signed)
Report given to Bobbie

## 2022-07-08 NOTE — ED Notes (Signed)
ED TO INPATIENT HANDOFF REPORT  ED Nurse Name and Phone #: Jearld Lesch Name/Age/Gender [name] 82 y.o. female Room/Bed: MH08/MH08  Code Status   Code Status: Prior  Home/SNF/Other Home Patient oriented to: self, place, time, and situation Is this baseline? Yes   Triage Complete: Triage complete  Chief Complaint Gross hematuria [R31.0]  Triage Note Patient brought in by wheelchair c/o decreased urine output and abnormal BMs throughout today. Patient unsure if blood in stool stating her eyesight is not good and need to check with her husband. Patient has INR checked yesterday and was told to hold coumadin.    Allergies Allergies  Allergen Reactions   Procaine Hcl Anaphylaxis   Amoxicillin Itching     Did it involve swelling of the face/tongue/throat, SOB, or low BP? No Did it involve sudden or severe rash/hives, skin peeling, or any reaction on the inside of your mouth or nose? No Did you need to seek medical attention at a hospital or doctor's office? No When did it last happen? unk If all above answers are "NO", may proceed with cephalosporin use.     Level of Care/Admitting Diagnosis ED Disposition     ED Disposition  Admit   Condition  --   Comment  Hospital Area: [facility] [[id number]]  Level of Care: Med-Surg [16]  Interfacility transfer: Yes  May place patient in observation at [facility] or [facility] if equivalent level of care is available:: No  Covid Evaluation: Asymptomatic - no recent exposure (last 10 days) testing not required  Diagnosis: Gross hematuria [599.71.ICD-9-CM]  Admitting Physician: Briscoe Deutscher [[id number]]  Attending Physician: Briscoe Deutscher [[id number]]          B Medical/Surgery History Past Medical History:  Diagnosis Date   Atrial fibrillation (HCC)    SVT dx 2007, cath 2007 mild CAD, then had an cardioversion, ablation; still on coumadin , has occ palpitation, EKG 03-2010 NSR   Blindness of  left eye    CKD (chronic kidney disease), stage III (HCC) 06/16/2016   [name] 06/17/2016   DIABETES MELLITUS, TYPE II 03/27/2006   dr Everardo All   Eye muscle weakness    Right eye weakness after cataract surgery   GERD (gastroesophageal reflux disease) 10/05/2011   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   HYPERLIPIDEMIA 03/27/2006   HYPERTENSION 03/27/2006   Intertrochanteric fracture of right hip (HCC) 07/13/2012   LUNG NODULE 09/01/2006   Excision, Bx Benighn   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"   Osteopenia 2004   Dexa 2004 showed Osteopenia, DEXA 03/2007 normal   Osteopenia    Other chronic cystitis with hematuria    Pelvic fracture (HCC) 06/16/2016   S/P fall   Recurrent urinary tract infection    Seeing Urology   RETINOPATHY, BACKGROUND NOS 03/27/2006   Seizures (HCC)    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   Past Surgical History:  Procedure Laterality Date   ANKLE FRACTURE SURGERY Bilateral 08/2015   "right one was in 2 places"   CARDIAC CATHETERIZATION N/A 05/17/2015   Procedure: Left Heart Cath and Coronary Angiography;  Surgeon: Corky Crafts, MD;  Location: [facility] INVASIVE CV LAB;  Service: Cardiovascular;  Laterality: N/A;   CARDIAC CATHETERIZATION N/A 05/17/2015   Procedure: Coronary Balloon Angioplasty;  Surgeon: Corky Crafts, MD;  Location: [facility] INVASIVE CV LAB;  Service: Cardiovascular;  Laterality: N/A;   FEMUR IM NAIL Right 07/15/2012   Procedure: INTRAMEDULLARY (IM) NAIL HIP;  Surgeon: Eulas Post, MD;  Location: [facility] OR;  Service: Orthopedics;  Laterality: Right;   FEMUR IM NAIL Left 12/02/2015   Procedure: INTRAMEDULLARY (IM) NAIL FEMORAL;  Surgeon: Eldred Manges, MD;  Location: [facility] OR;  Service: Orthopedics;  Laterality: Left;   FRACTURE SURGERY     INCISION AND DRAINAGE HIP Left 01/03/2016   Procedure: IRRIGATION AND DEBRIDEMENT HIP;  Surgeon: Eldred Manges, MD;  Location: [facility] OR;  Service: Orthopedics;  Laterality: Left;   SHOULDER SURGERY Bilateral    "don't remember  which side"     A IV Location/Drains/Wounds Patient Lines/Drains/Airways Status     Active Line/Drains/Airways     Name Placement date Placement time Site Days   Peripheral IV 07/07/22 20 G Anterior;Left;Distal Forearm 07/07/22  1723  Forearm  1   Urethral Catheter Vonna Kotyk, RN Triple-lumen 22 Fr. 07/07/22  2130  Triple-lumen  1            Intake/Output Last 24 hours  Intake/Output Summary (Last 24 hours) at 07/08/2022 0702 Last data filed at 07/08/2022 0527 Gross per 24 hour  Intake 100 ml  Output 7300 ml  Net -7200 ml    Labs/Imaging Results for orders placed or performed during the hospital encounter of 07/07/22 (from the past 48 hour(s))  CBC with Differential     Status: Abnormal   Collection Time: 07/07/22  4:37 PM  Result Value Ref Range   WBC 14.8 (H) 4.0 - 10.5 K/uL   RBC 3.70 (L) 3.87 - 5.11 MIL/uL   Hemoglobin 11.1 (L) 12.0 - 15.0 g/dL   HCT 16.1 (L) 09.6 - 04.5 %   MCV 92.2 80.0 - 100.0 fL   MCH 30.0 26.0 - 34.0 pg   MCHC 32.6 30.0 - 36.0 g/dL   RDW 40.9 81.1 - 91.4 %   Platelets 574 (H) 150 - 400 K/uL   nRBC 0.0 0.0 - 0.2 %   Neutrophils Relative % 75 %   Neutro Abs 11.0 (H) 1.7 - 7.7 K/uL   Lymphocytes Relative 14 %   Lymphs Abs 2.1 0.7 - 4.0 K/uL   Monocytes Relative 10 %   Monocytes Absolute 1.5 (H) 0.1 - 1.0 K/uL   Eosinophils Relative 1 %   Eosinophils Absolute 0.1 0.0 - 0.5 K/uL   Basophils Relative 0 %   Basophils Absolute 0.0 0.0 - 0.1 K/uL   Immature Granulocytes 0 %   Abs Immature Granulocytes 0.06 0.00 - 0.07 K/uL    Comment: Performed at [facility], [address] [place], [place] [address]  Comprehensive metabolic panel     Status: Abnormal   Collection Time: 07/07/22  4:37 PM  Result Value Ref Range   Sodium 136 135 - 145 mmol/L   Potassium 4.0 3.5 - 5.1 mmol/L   Chloride 97 (L) 98 - 111 mmol/L   CO2 26 22 - 32 mmol/L   Glucose, Bld 147 (H) 70 - 99 mg/dL    Comment: Glucose reference range applies only to  samples taken after fasting for at least 8 hours.   BUN 29 (H) 8 - 23 mg/dL   Creatinine, Ser 6.21 (H) 0.44 - 1.00 mg/dL   Calcium 8.5 (L) 8.9 - 10.3 mg/dL   Total Protein 7.3 6.5 - 8.1 g/dL   Albumin 2.7 (L) 3.5 - 5.0 g/dL   AST 39 15 - 41 U/L   ALT 19 0 - 44 U/L   Alkaline Phosphatase 79 38 - 126 U/L  Total Bilirubin 0.7 0.3 - 1.2 mg/dL   GFR, Estimated 28 (L) >60 mL/min    Comment: (NOTE) Calculated using the CKD-EPI Creatinine Equation (2021)    Anion gap 13 5 - 15    Comment: Performed at [facility], [address] [place], [place] [address]  Protime-INR     Status: Abnormal   Collection Time: 07/07/22  4:37 PM  Result Value Ref Range   Prothrombin Time 47.8 (H) 11.4 - 15.2 seconds   INR 5.2 (HH) 0.8 - 1.2    Comment: REPEATED TO VERIFY CRITICAL RESULT CALLED TO, READ BACK BY AND VERIFIED WITH: A. NOAH RN AT 1825 ON 07/07/22 BY [email or web address] (NOTE) INR goal varies based on device and disease states. Performed at [facility], [address] [place], [place] [address]   Urinalysis, w/ Reflex to Culture (Infection Suspected) -Urine, Clean Catch     Status: Abnormal   Collection Time: 07/07/22  4:37 PM  Result Value Ref Range   Specimen Source URINE, CLEAN CATCH    Color, Urine RED (A) YELLOW    Comment: BIOCHEMICALS MAY BE AFFECTED BY COLOR   APPearance TURBID (A) CLEAR   Specific Gravity, Urine  1.005 - 1.030    TEST NOT REPORTED DUE TO COLOR INTERFERENCE OF URINE PIGMENT   pH  5.0 - 8.0    TEST NOT REPORTED DUE TO COLOR INTERFERENCE OF URINE PIGMENT   Glucose, UA (A) NEGATIVE mg/dL    TEST NOT REPORTED DUE TO COLOR INTERFERENCE OF URINE PIGMENT   Hgb urine dipstick (A) NEGATIVE    TEST NOT REPORTED DUE TO COLOR INTERFERENCE OF URINE PIGMENT   Bilirubin Urine (A) NEGATIVE    TEST NOT REPORTED DUE TO COLOR INTERFERENCE OF URINE PIGMENT   Ketones, ur (A) NEGATIVE mg/dL    TEST NOT REPORTED DUE TO COLOR INTERFERENCE OF URINE PIGMENT   Protein, ur  (A) NEGATIVE mg/dL    TEST NOT REPORTED DUE TO COLOR INTERFERENCE OF URINE PIGMENT   Nitrite (A) NEGATIVE    TEST NOT REPORTED DUE TO COLOR INTERFERENCE OF URINE PIGMENT   Leukocytes,Ua (A) NEGATIVE    TEST NOT REPORTED DUE TO COLOR INTERFERENCE OF URINE PIGMENT   Squamous Epithelial / HPF 6-10 0 - 5 /HPF   WBC, UA 11-20 0 - 5 WBC/hpf    Comment: Reflex urine culture not performed if WBC <=10, OR if Squamous epithelial cells >5. If Squamous epithelial cells >5, suggest recollection.   RBC / HPF >50 0 - 5 RBC/hpf   Bacteria, UA MANY (A) NONE SEEN    Comment: Performed at [facility], [address] [place], [place] [address]  Occult blood card to lab, stool Provider will collect     Status: None   Collection Time: 07/07/22  7:16 PM  Result Value Ref Range   Fecal Occult Bld NEGATIVE NEGATIVE    Comment: Performed at [facility], [address] [place], [place] [address]  CBC with Differential     Status: Abnormal   Collection Time: 07/08/22  1:04 AM  Result Value Ref Range   WBC 13.1 (H) 4.0 - 10.5 K/uL   RBC 3.26 (L) 3.87 - 5.11 MIL/uL   Hemoglobin 9.8 (L) 12.0 - 15.0 g/dL   HCT 29.5 (L) 62.1 - 30.8 %   MCV 90.8 80.0 - 100.0 fL   MCH 30.1 26.0 - 34.0 pg   MCHC 33.1 30.0 - 36.0  g/dL   RDW 16.1 09.6 - 04.5 %   Platelets 482 (H) 150 - 400 K/uL   nRBC 0.0 0.0 - 0.2 %   Neutrophils Relative % 73 %   Neutro Abs 9.6 (H) 1.7 - 7.7 K/uL   Lymphocytes Relative 15 %   Lymphs Abs 1.9 0.7 - 4.0 K/uL   Monocytes Relative 10 %   Monocytes Absolute 1.3 (H) 0.1 - 1.0 K/uL   Eosinophils Relative 1 %   Eosinophils Absolute 0.1 0.0 - 0.5 K/uL   Basophils Relative 0 %   Basophils Absolute 0.1 0.0 - 0.1 K/uL   Immature Granulocytes 1 %   Abs Immature Granulocytes 0.07 0.00 - 0.07 K/uL    Comment: Performed at [facility], [address] [place], [place] [address]  Basic metabolic panel     Status: Abnormal   Collection Time: 07/08/22  6:33 AM  Result  Value Ref Range   Sodium 136 135 - 145 mmol/L   Potassium 4.6 3.5 - 5.1 mmol/L   Chloride 100 98 - 111 mmol/L   CO2 27 22 - 32 mmol/L   Glucose, Bld 180 (H) 70 - 99 mg/dL    Comment: Glucose reference range applies only to samples taken after fasting for at least 8 hours.   BUN 29 (H) 8 - 23 mg/dL   Creatinine, Ser 1.91 (H) 0.44 - 1.00 mg/dL   Calcium 8.1 (L) 8.9 - 10.3 mg/dL   GFR, Estimated 29 (L) >60 mL/min    Comment: (NOTE) Calculated using the CKD-EPI Creatinine Equation (2021)    Anion gap 9 5 - 15    Comment: Performed at [facility], [address] [place], [place] [address]  CBC with Differential     Status: Abnormal   Collection Time: 07/08/22  6:33 AM  Result Value Ref Range   WBC 13.1 (H) 4.0 - 10.5 K/uL   RBC 3.44 (L) 3.87 - 5.11 MIL/uL   Hemoglobin 10.2 (L) 12.0 - 15.0 g/dL   HCT 56.2 (L) 13.0 - 86.5 %   MCV 91.6 80.0 - 100.0 fL   MCH 29.7 26.0 - 34.0 pg   MCHC 32.4 30.0 - 36.0 g/dL   RDW 78.4 69.6 - 29.5 %   Platelets 495 (H) 150 - 400 K/uL   nRBC 0.0 0.0 - 0.2 %   Neutrophils Relative % 72 %   Neutro Abs 9.3 (H) 1.7 - 7.7 K/uL   Lymphocytes Relative 18 %   Lymphs Abs 2.3 0.7 - 4.0 K/uL   Monocytes Relative 10 %   Monocytes Absolute 1.4 (H) 0.1 - 1.0 K/uL   Eosinophils Relative 0 %   Eosinophils Absolute 0.1 0.0 - 0.5 K/uL   Basophils Relative 0 %   Basophils Absolute 0.1 0.0 - 0.1 K/uL   Immature Granulocytes 0 %   Abs Immature Granulocytes 0.05 0.00 - 0.07 K/uL    Comment: Performed at [facility], [address] [place], [place] [address]  Protime-INR     Status: Abnormal   Collection Time: 07/08/22  6:33 AM  Result Value Ref Range   Prothrombin Time 37.2 (H) 11.4 - 15.2 seconds   INR 3.7 (H) 0.8 - 1.2    Comment: (NOTE) INR goal varies based on device and disease states. Performed at [facility], [address] [place], [place] [address]    *Note: Due to a large number of results and/or encounters for the  requested time period, some results have not been displayed. A complete set of results can be found in Results Review.   CT Renal Stone Study  Result Date: 07/07/2022 CLINICAL DATA:  82 year old female with abdominal/flank pain and decreased urine output. Stone suspected. Hematuria. EXAM: CT ABDOMEN AND PELVIS WITHOUT CONTRAST TECHNIQUE: Multidetector CT imaging of the abdomen and pelvis was performed following the standard protocol without IV contrast. RADIATION DOSE REDUCTION: This exam was performed according to the departmental dose-optimization program which includes automated exposure control, adjustment of the mA and/or kV according to patient size and/or use of iterative reconstruction technique. COMPARISON:  CT abdomen and pelvis 01/20/2022 FINDINGS: Lower chest: No acute abnormality.  Mitral annular calcification. Hepatobiliary: Cholelithiasis. No evidence of cholecystitis. No focal hepatic lesion on noncontrast exam. Pancreas: No acute abnormality. Atrophic. Stable cystic lesions in the pancreatic tail measuring up to 1.3 cm. Spleen: Unremarkable. Adrenals/Urinary Tract: Stable adrenal glands. Bilateral cortical renal scarring. Asymmetric left perinephric stranding prominent bilateral renal pelves. Nonobstructing punctate left nephrolithiasis. Moderate left hydroureteronephrosis. Heterogenous high density fluid within the bladder. Foley catheter balloon in the bladder. Gas within the bladder. Diffuse bladder wall thickening with adjacent perivesicular stranding. There are a few calcific densities within the bladder. Stomach/Bowel: Normal caliber large and small bowel. Colonic diverticulosis without diverticulitis. Normal appendix. Small hiatal hernia. Stomach is within normal limits. Vascular/Lymphatic: Extensive aortic atherosclerotic calcification. No lymphadenopathy. Reproductive: Uterus and bilateral adnexa are unremarkable. Other: Smaller anterior toward in the pelvis. No free intraperitoneal air.  Musculoskeletal: Bilateral postoperative changes about the proximal femurs. No acute osseous abnormality. Chronic T11 and L1 compression fractures. Demineralization. Thoracolumbar spondylosis. IMPRESSION: 1. Constellation of findings suggestive of hemorrhagic cystitis. Asymmetric stranding about the left kidney with left hydroureteronephrosis could be due to obstruction from clot within the bladder or pyelonephritis. Underlying bladder malignancy not excluded. Urology consultation recommended. 2. Nonobstructing punctate left nephrolithiasis. 3. Cholelithiasis. 4. Colonic diverticulosis without diverticulitis. 5. Stable cystic lesions in the pancreatic tail measuring up to 1.3 cm. Aortic Atherosclerosis (ICD10-I70.0). Electronically Signed   By: Minerva Fester M.D.   On: 07/07/2022 18:27    Pending Labs Unresulted Labs (From admission, onward)     Start     Ordered   07/07/22 1946  Urine Culture  Once,   URGENT       Question:  Indication  Answer:  Suprapubic pain   07/07/22 1945            Vitals/Pain Today's Vitals   07/08/22 0400 07/08/22 0432 07/08/22 0600 07/08/22 0615  BP: (!) 124/50  (!) 148/58   Pulse: 60  (!) 56 (!) 59  Resp: 15  13 16   Temp:  98.4 F (36.9 C)    TempSrc:  Oral    SpO2: 92%  91% 95%  Weight:      Height:      PainSc:        Isolation Precautions No active isolations  Medications Medications  sodium chloride irrigation 0.9 % 3,000 mL (0 mLs Irrigation Hold 07/07/22 2227)  cefTRIAXone (ROCEPHIN) 1 g in sodium chloride 0.9 % 100 mL IVPB (0 g Intravenous Stopped 07/07/22 1947)    Mobility walks with person assist     Focused Assessments Urinary    R Recommendations: See Admitting Provider Note  Report given to:   Additional Notes: CBI

## 2022-07-08 NOTE — Treatment Plan (Signed)
Received call from [facility] ED re: [name] [name]. Patient with gross hematuria, supratherapeutic INR. Requested yesterday evening she be transferred to [facility] for evaluation. Has orders for hospitalist admission but bed availability has limited her ability to get her. Spoke again with team in AM, they will see if ED to ED is quicker for transfer.   Patient currently with 3-way on CBI in ED. Requested this be monitored very carefully while she is in ED and during transfer.

## 2022-07-08 NOTE — Progress Notes (Signed)
Critical lactic acid result received from lab; Lactic acid - 2.4; Dr. Kirby Crigler notified; no new orders received; will continue to monitor

## 2022-07-08 NOTE — ED Notes (Signed)
Report called to Metallurgist at [employer]

## 2022-07-08 NOTE — ED Notes (Signed)
DASH courier in building to drop off supplies for CBI

## 2022-07-09 DIAGNOSIS — R310 Gross hematuria: Secondary | ICD-10-CM | POA: Diagnosis not present

## 2022-07-09 LAB — URINE CULTURE: Culture: >=100000 — AB

## 2022-07-09 LAB — BASIC METABOLIC PANEL
Anion gap: 9 (ref 5–15)
BUN: 29 mg/dL — ABNORMAL HIGH (ref 8–23)
CO2: 24 mmol/L (ref 22–32)
Calcium: 7.7 mg/dL — ABNORMAL LOW (ref 8.9–10.3)
Chloride: 103 mmol/L (ref 98–111)
Creatinine, Ser: 1.66 mg/dL — ABNORMAL HIGH (ref 0.44–1.00)
GFR, Estimated: 31 mL/min — ABNORMAL LOW (ref >60)
Glucose, Bld: 200 mg/dL — ABNORMAL HIGH (ref 70–99)
Potassium: 4.9 mmol/L (ref 3.5–5.1)
Sodium: 136 mmol/L (ref 135–145)

## 2022-07-09 LAB — CBC
HCT: 31.5 % — ABNORMAL LOW (ref 36.0–46.0)
Hemoglobin: 10.2 g/dL — ABNORMAL LOW (ref 12.0–15.0)
MCH: 30.9 pg (ref 26.0–34.0)
MCHC: 32.4 g/dL (ref 30.0–36.0)
MCV: 95.5 fL (ref 80.0–100.0)
Platelets: 504 10*3/uL — ABNORMAL HIGH (ref 150–400)
RBC: 3.3 MIL/uL — ABNORMAL LOW (ref 3.87–5.11)
RDW: 12.7 % (ref 11.5–15.5)
WBC: 12.4 10*3/uL — ABNORMAL HIGH (ref 4.0–10.5)
nRBC: 0 % (ref 0.0–0.2)

## 2022-07-09 LAB — GLUCOSE, CAPILLARY
Glucose-Capillary: 201 mg/dL — ABNORMAL HIGH (ref 70–99)
Glucose-Capillary: 118 mg/dL — ABNORMAL HIGH (ref 70–99)
Glucose-Capillary: 124 mg/dL — ABNORMAL HIGH (ref 70–99)
Glucose-Capillary: 120 mg/dL — ABNORMAL HIGH (ref 70–99)

## 2022-07-09 LAB — PROTIME-INR
INR: 1.4 — ABNORMAL HIGH (ref 0.8–1.2)
Prothrombin Time: 17.8 seconds — ABNORMAL HIGH (ref 11.4–15.2)

## 2022-07-09 NOTE — Progress Notes (Signed)
Subjective: History of Present Illness: [name] [name] is an 82 year old female presenting to [facility] in transfer from [facility] where she was found to have supratherapeutic INR to 5.2 with ongoing hemorrhagic cystitis and left-sided hydroureteronephrosis.  PMH significant for ESBL UTI, T2DM, ischemic cardiomyopathy, CKD stage III, and PAF on chronic Coumadin.  [facility] to address abnormal CT findings and hematuria.   5/9: NAEON.  Dr. Pete Glatter exchanged her catheter for a larger bore Rusch to ensure she had complete clot evacuation based on CT imaging of her bladder.  Objective: Vital signs in last 24 hours: Temp:  [97.7 F (36.5 C)-98.3 F (36.8 C)] 97.7 F (36.5 C) (05/09 0542) Pulse Rate:  [59-77] 61 (05/09 0542) Resp:  [17-18] 17 (05/09 0542) BP: (139-158)/(51-62) 157/56 (05/09 0542) SpO2:  [94 %-98 %] 96 % (05/09 0542)  Intake/Output from previous day: 05/08 0701 - 05/09 0700 In: 40981 [P.O.:600] Out: 14075 [Urine:14075]  Intake/Output this shift: Total I/O In: 600 [Other:600] Out: 1000 [Urine:1000]  Physical Exam:  General: Alert and oriented CV: No cyanosis Lungs: equal chest rise Abdomen: Soft, NTND, no rebound or guarding Gu: 28f Rusch hematuria catheter in place.  CBI clamped on rounds.  Very light pink irrigant.  Lab Results: Recent Labs    07/08/22 0104 07/08/22 0633 07/09/22 0348  HGB 9.8* 10.2* 10.2*  HCT 29.6* 31.5* 31.5*   BMET Recent Labs    07/08/22 0633 07/09/22 0348  NA 136 136  K 4.6 4.9  CL 100 103  CO2 27 24  GLUCOSE 180* 200*  BUN 29* 29*  CREATININE 1.74* 1.66*  CALCIUM 8.1* 7.7*     Studies/Results: CT Renal Stone Study  Result Date: 07/07/2022 CLINICAL DATA:  82 year old female with abdominal/flank pain and decreased urine output. Stone suspected. Hematuria. EXAM: CT ABDOMEN AND PELVIS WITHOUT CONTRAST TECHNIQUE: Multidetector CT imaging of the abdomen and pelvis was  performed following the standard protocol without IV contrast. RADIATION DOSE REDUCTION: This exam was performed according to the departmental dose-optimization program which includes automated exposure control, adjustment of the mA and/or kV according to patient size and/or use of iterative reconstruction technique. COMPARISON:  CT abdomen and pelvis 01/20/2022 FINDINGS: Lower chest: No acute abnormality.  Mitral annular calcification. Hepatobiliary: Cholelithiasis. No evidence of cholecystitis. No focal hepatic lesion on noncontrast exam. Pancreas: No acute abnormality. Atrophic. Stable cystic lesions in the pancreatic tail measuring up to 1.3 cm. Spleen: Unremarkable. Adrenals/Urinary Tract: Stable adrenal glands. Bilateral cortical renal scarring. Asymmetric left perinephric stranding prominent bilateral renal pelves. Nonobstructing punctate left nephrolithiasis. Moderate left hydroureteronephrosis. Heterogenous high density fluid within the bladder. Foley catheter balloon in the bladder. Gas within the bladder. Diffuse bladder wall thickening with adjacent perivesicular stranding. There are a few calcific densities within the bladder. Stomach/Bowel: Normal caliber large and small bowel. Colonic diverticulosis without diverticulitis. Normal appendix. Small hiatal hernia. Stomach is within normal limits. Vascular/Lymphatic: Extensive aortic atherosclerotic calcification. No lymphadenopathy. Reproductive: Uterus and bilateral adnexa are unremarkable. Other: Smaller anterior toward in the pelvis. No free intraperitoneal air. Musculoskeletal: Bilateral postoperative changes about the proximal femurs. No acute osseous abnormality. Chronic T11 and L1 compression fractures. Demineralization. Thoracolumbar spondylosis. IMPRESSION: 1. Constellation of findings suggestive of hemorrhagic cystitis. Asymmetric stranding about the left kidney with left hydroureteronephrosis could be due to obstruction from clot within the  bladder or pyelonephritis. Underlying bladder malignancy not excluded. Urology consultation recommended. 2. Nonobstructing punctate left nephrolithiasis. 3. Cholelithiasis. 4. Colonic diverticulosis without diverticulitis. 5. Stable  cystic lesions in the pancreatic tail measuring up to 1.3 cm. Aortic Atherosclerosis (ICD10-I70.0). Electronically Signed   By: Minerva Fester M.D.   On: 07/07/2022 18:27    Assessment/Plan: Small interval improvement in serum creatinine from 1.74-1.66.  Improvement in leukocytosis from 13.1-12.4.  NGTD on blood cultures.  Meropenem per primary team  Lactic acidosis has normalized  Following multiple doses of vitamin K, INR has improved from 7.9-1.4 this morning.  Predictably this is corrected the majority of her hematuria.  There is still a component of hemorrhagic cystitis so she may see some blood in the future but I was able to stop her continuous bladder irrigation this morning.  I will check on her again midday and hope that CBI can be discontinued.   LOS: 1 day   Elmon Kirschner, NP [facility] Pager: [phone]  07/09/2022, 10:00 AM

## 2022-07-09 NOTE — Progress Notes (Signed)
CBI discontinued. Clear yellow urine as of about 1:00pm

## 2022-07-09 NOTE — Progress Notes (Signed)
CBI continues to run clear/pink throughout the shift with no clots.

## 2022-07-09 NOTE — TOC CM/SW Note (Signed)
  Transition of Care [facility]) Screening Note   Patient Details  Name: [name] [name] Date of Birth: 1940/07/06   Transition of Care [facility]) CM/SW Contact:    Amada Jupiter, LCSW Phone Number: 07/09/2022, 10:52 AM    Transition of Care Department [facility]) has reviewed patient and no TOC needs have been identified at this time. We will continue to monitor patient advancement through interdisciplinary progression rounds. If [name] patient transition needs arise, please place a TOC consult.

## 2022-07-09 NOTE — Progress Notes (Signed)
PROGRESS NOTE    [name] [name]  ZOX:[id number] DOB: April 08, 1940 DOA: 07/07/2022 PCP: [name], MD    Brief Narrative:  82 year old with history of ESBL UTI , insulin-dependent type 2 diabetes, ischemic cardiomyopathy, CKD stage IV, paroxysmal A-fib on Coumadin and metoprolol admitted after presenting with blood in urine.  Recently she was found to have INR of 7.9 on 5/6, she was told to hold her warfarin.  Patient started having difficulty voiding and blood in urine.  Admitted with hematuria, supratherapeutic INR of 5.2.   Assessment & Plan:   Hemorrhagic cystitis, urinary bladder bleeding secondary to supratherapeutic INR: Presented with hematuria and or suprapubic discomfort. CT scan showed hydronephrosis and blood clot in the urinary bladder. Followed by urology, currently on CBI with improvement. Blood cultures and urine cultures pending, started on meropenem due to recent ESBL infection. Followed by urology.  Supratherapeutic INR secondary to Coumadin use: Reversed.  On hold.  INR 1.7. Coumadin use secondary to A-fib, can hold for now until clinical improvement. She can probably go on NOAC, will discuss with family.  Chronic medical issues including Essential hypertension, on metoprolol GERD, on PPI Paroxysmal A-fib, as above.  Currently in sinus rhythm.  On metoprolol.  Coumadin on hold. CKD stage IV known baseline creatinine of 1.6-1.8.  At about baseline.  Monitor.   DVT prophylaxis: SCDs Start: 07/08/22 0944   Code Status: Full code, previously documented DNR and has ACP documents. Family Communication: None at the bedside Disposition Plan: Status is: Inpatient Remains inpatient appropriate because: Continuous bladder irrigation, IV antibiotics     Consultants:  Urology  Procedures:  None  Antimicrobials:  Meropenem 5/8---   Subjective: Patient seen and examined.  Poor historian.  She is focused on chest wall pain that she gets occasionally for the  last 4 years.  Denies any suprapubic pain or discomfort.  No family were at the bedside. Urine is clear and currently on slow CBI.  Objective: Vitals:   07/08/22 2302 07/09/22 0144 07/09/22 0542 07/09/22 1002  BP: (!) 139/51 (!) 158/57 (!) 157/56 (!) 157/62  Pulse: (!) 59 63 61 63  Resp: 17 17 17 16   Temp: 98.3 F (36.8 C) 98.3 F (36.8 C) 97.7 F (36.5 C) 97.7 F (36.5 C)  TempSrc: Oral Oral Oral Oral  SpO2: 97% 95% 96% 99%  Weight:      Height:        Intake/Output Summary (Last 24 hours) at 07/09/2022 1054 Last data filed at 07/09/2022 1002 Gross per 24 hour  Intake 8120 ml  Output 81191 ml  Net -3405 ml   Filed Weights   07/07/22 1630  Weight: 77.6 kg    Examination:  General exam: Looks comfortable. Respiratory system: No added sounds. Cardiovascular system: S1 & S2 heard, RRR.  Gastrointestinal system: Abdomen is nondistended, soft and nontender. No organomegaly or masses felt. Normal bowel sounds heard. Foley catheter with pink-tinged urine. Central nervous system: Alert and oriented. No focal neurological deficits.    Data Reviewed: I have personally reviewed following labs and imaging studies  CBC: Recent Labs  Lab 07/07/22 1637 07/08/22 0104 07/08/22 0633 07/09/22 0348  WBC 14.8* 13.1* 13.1* 12.4*  NEUTROABS 11.0* 9.6* 9.3*  --   HGB 11.1* 9.8* 10.2* 10.2*  HCT 34.1* 29.6* 31.5* 31.5*  MCV 92.2 90.8 91.6 95.5  PLT 574* 482* 495* 504*   Basic Metabolic Panel: Recent Labs  Lab 07/07/22 1637 07/08/22 0633 07/09/22 0348  NA 136 136 136  K  4.0 4.6 4.9  CL 97* 100 103  CO2 26 27 24   GLUCOSE 147* 180* 200*  BUN 29* 29* 29*  CREATININE 1.83* 1.74* 1.66*  CALCIUM 8.5* 8.1* 7.7*   GFR: Estimated Creatinine Clearance: 29.6 mL/min (A) (by C-G formula based on SCr of 1.66 mg/dL (H)). Liver Function Tests: Recent Labs  Lab 07/07/22 1637  AST 39  ALT 19  ALKPHOS 79  BILITOT 0.7  PROT 7.3  ALBUMIN 2.7*   No results for input(s): "LIPASE",  "AMYLASE" in the last 168 hours. No results for input(s): "AMMONIA" in the last 168 hours. Coagulation Profile: Recent Labs  Lab 07/06/22 1128 07/07/22 1637 07/08/22 0633 07/08/22 1337 07/09/22 0348  INR 7.9* 5.2* 3.7* 2.3* 1.4*   Cardiac Enzymes: No results for input(s): "CKTOTAL", "CKMB", "CKMBINDEX", "TROPONINI" in the last 168 hours. BNP (last 3 results) No results for input(s): "PROBNP" in the last 8760 hours. HbA1C: No results for input(s): "HGBA1C" in the last 72 hours. CBG: Recent Labs  Lab 07/08/22 1129 07/08/22 1706 07/08/22 2306 07/09/22 0759  GLUCAP 200* 193* 173* 201*   Lipid Profile: No results for input(s): "CHOL", "HDL", "LDLCALC", "TRIG", "CHOLHDL", "LDLDIRECT" in the last 72 hours. Thyroid Function Tests: No results for input(s): "TSH", "T4TOTAL", "FREET4", "T3FREE", "THYROIDAB" in the last 72 hours. Anemia Panel: No results for input(s): "VITAMINB12", "FOLATE", "FERRITIN", "TIBC", "IRON", "RETICCTPCT" in the last 72 hours. Sepsis Labs: Recent Labs  Lab 07/08/22 1115 07/08/22 1337  LATICACIDVEN 2.4* 1.7    Recent Results (from the past 240 hour(s))  Urine Culture     Status: Abnormal (Preliminary result)   Collection Time: 07/07/22  7:46 PM   Specimen: Urine, Clean Catch  Result Value Ref Range Status   Specimen Description   Final    URINE, CLEAN CATCH Performed at [facility], [address] [place], [place] [address]    Special Requests   Final    NONE Performed at [facility], [address] [place], [place] [address]    Culture (A)  Final    >=100,000 COLONIES/mL ESCHERICHIA COLI 80,000 COLONIES/mL KLEBSIELLA PNEUMONIAE SUSCEPTIBILITIES TO FOLLOW Performed at [facility], [address], [place], [place] [address]    Report Status PENDING  Incomplete  Culture, blood (Routine X 2) w Reflex to ID Panel     Status: None (Preliminary result)   Collection Time: 07/08/22 11:15 AM   Specimen: BLOOD RIGHT  ARM  Result Value Ref Range Status   Specimen Description   Final    BLOOD RIGHT ARM Performed at [facility], [address], [place], [place] [address]    Special Requests   Final    AEROBIC BOTTLE ONLY Blood Culture adequate volume Performed at [facility], [address], [place], [place] [address]    Culture   Final    NO GROWTH < 24 HOURS Performed at [facility], [address] [place], [place] [address]    Report Status PENDING  Incomplete  Culture, blood (Routine X 2) w Reflex to ID Panel     Status: None (Preliminary result)   Collection Time: 07/08/22 11:15 AM   Specimen: BLOOD RIGHT HAND  Result Value Ref Range Status   Specimen Description   Final    BLOOD RIGHT HAND Performed at [facility], [address], [place], [place] [address]    Special Requests   Final    AEROBIC BOTTLE ONLY  Blood Culture adequate volume Performed at [facility], [address] [place], [place] [address]    Culture   Final    NO GROWTH < 24 HOURS Performed at [facility], [address] [place], [place] [address]    Report Status PENDING  Incomplete         Radiology Studies: CT Renal Stone Study  Result Date: 07/07/2022 CLINICAL DATA:  82 year old female with abdominal/flank pain and decreased urine output. Stone suspected. Hematuria. EXAM: CT ABDOMEN AND PELVIS WITHOUT CONTRAST TECHNIQUE: Multidetector CT imaging of the abdomen and pelvis was performed following the standard protocol without IV contrast. RADIATION DOSE REDUCTION: This exam was performed according to the departmental dose-optimization program which includes automated exposure control, adjustment of the mA and/or kV according to patient size and/or use of iterative reconstruction technique. COMPARISON:  CT abdomen and pelvis 01/20/2022 FINDINGS: Lower chest: No acute abnormality.  Mitral annular calcification. Hepatobiliary:  Cholelithiasis. No evidence of cholecystitis. No focal hepatic lesion on noncontrast exam. Pancreas: No acute abnormality. Atrophic. Stable cystic lesions in the pancreatic tail measuring up to 1.3 cm. Spleen: Unremarkable. Adrenals/Urinary Tract: Stable adrenal glands. Bilateral cortical renal scarring. Asymmetric left perinephric stranding prominent bilateral renal pelves. Nonobstructing punctate left nephrolithiasis. Moderate left hydroureteronephrosis. Heterogenous high density fluid within the bladder. Foley catheter balloon in the bladder. Gas within the bladder. Diffuse bladder wall thickening with adjacent perivesicular stranding. There are a few calcific densities within the bladder. Stomach/Bowel: Normal caliber large and small bowel. Colonic diverticulosis without diverticulitis. Normal appendix. Small hiatal hernia. Stomach is within normal limits. Vascular/Lymphatic: Extensive aortic atherosclerotic calcification. No lymphadenopathy. Reproductive: Uterus and bilateral adnexa are unremarkable. Other: Smaller anterior toward in the pelvis. No free intraperitoneal air. Musculoskeletal: Bilateral postoperative changes about the proximal femurs. No acute osseous abnormality. Chronic T11 and L1 compression fractures. Demineralization. Thoracolumbar spondylosis. IMPRESSION: 1. Constellation of findings suggestive of hemorrhagic cystitis. Asymmetric stranding about the left kidney with left hydroureteronephrosis could be due to obstruction from clot within the bladder or pyelonephritis. Underlying bladder malignancy not excluded. Urology consultation recommended. 2. Nonobstructing punctate left nephrolithiasis. 3. Cholelithiasis. 4. Colonic diverticulosis without diverticulitis. 5. Stable cystic lesions in the pancreatic tail measuring up to 1.3 cm. Aortic Atherosclerosis (ICD10-I70.0). Electronically Signed   By: Minerva Fester M.D.   On: 07/07/2022 18:27        Scheduled Meds:  atorvastatin  80 mg  Oral Daily   Chlorhexidine Gluconate Cloth  6 each Topical Daily   ezetimibe  10 mg Oral Daily   insulin aspart  0-15 Units Subcutaneous TID WC   insulin aspart  0-5 Units Subcutaneous QHS   insulin aspart protamine- aspart  10 Units Subcutaneous Q breakfast   metoprolol succinate  50 mg Oral Daily   Continuous Infusions:  meropenem (MERREM) IV 1 g (07/09/22 0131)   sodium chloride irrigation       LOS: 1 day    Time spent: 35 minutes    Dorcas Carrow, MD [facility] Pager [phone]

## 2022-07-10 ENCOUNTER — Ambulatory Visit: Payer: Medicare Other

## 2022-07-10 DIAGNOSIS — R310 Gross hematuria: Secondary | ICD-10-CM | POA: Diagnosis not present

## 2022-07-10 LAB — CBC WITH DIFFERENTIAL/PLATELET
Abs Immature Granulocytes: 0.04 10*3/uL (ref 0.00–0.07)
Basophils Absolute: 0.1 10*3/uL (ref 0.0–0.1)
Basophils Relative: 1 %
Eosinophils Absolute: 0.2 10*3/uL (ref 0.0–0.5)
Eosinophils Relative: 2 %
HCT: 32.5 % — ABNORMAL LOW (ref 36.0–46.0)
Hemoglobin: 10.5 g/dL — ABNORMAL LOW (ref 12.0–15.0)
Immature Granulocytes: 0 %
Lymphocytes Relative: 21 %
Lymphs Abs: 1.9 10*3/uL (ref 0.7–4.0)
MCH: 30.3 pg (ref 26.0–34.0)
MCHC: 32.3 g/dL (ref 30.0–36.0)
MCV: 93.9 fL (ref 80.0–100.0)
Monocytes Absolute: 1 10*3/uL (ref 0.1–1.0)
Monocytes Relative: 11 %
Neutro Abs: 6 10*3/uL (ref 1.7–7.7)
Neutrophils Relative %: 65 %
Platelets: 524 10*3/uL — ABNORMAL HIGH (ref 150–400)
RBC: 3.46 MIL/uL — ABNORMAL LOW (ref 3.87–5.11)
RDW: 12.5 % (ref 11.5–15.5)
WBC: 9.2 10*3/uL (ref 4.0–10.5)
nRBC: 0 % (ref 0.0–0.2)

## 2022-07-10 LAB — CULTURE, BLOOD (ROUTINE X 2)
Culture: NO GROWTH
Culture: NO GROWTH

## 2022-07-10 LAB — BASIC METABOLIC PANEL
Anion gap: 9 (ref 5–15)
BUN: 23 mg/dL (ref 8–23)
CO2: 23 mmol/L (ref 22–32)
Calcium: 7.6 mg/dL — ABNORMAL LOW (ref 8.9–10.3)
Chloride: 102 mmol/L (ref 98–111)
Creatinine, Ser: 1.41 mg/dL — ABNORMAL HIGH (ref 0.44–1.00)
GFR, Estimated: 38 mL/min — ABNORMAL LOW (ref >60)
Glucose, Bld: 127 mg/dL — ABNORMAL HIGH (ref 70–99)
Potassium: 4.5 mmol/L (ref 3.5–5.1)
Sodium: 134 mmol/L — ABNORMAL LOW (ref 135–145)

## 2022-07-10 LAB — URINE CULTURE

## 2022-07-10 LAB — MAGNESIUM: Magnesium: 1.8 mg/dL (ref 1.7–2.4)

## 2022-07-10 LAB — GLUCOSE, CAPILLARY
Glucose-Capillary: 146 mg/dL — ABNORMAL HIGH (ref 70–99)
Glucose-Capillary: 148 mg/dL — ABNORMAL HIGH (ref 70–99)

## 2022-07-10 MED ORDER — WARFARIN SODIUM 2.5 MG PO TABS
2.5000 mg | ORAL_TABLET | Freq: Every day | ORAL | Status: DC
Start: 2022-07-10 — End: 2022-07-10

## 2022-07-10 MED ORDER — WARFARIN - PHARMACIST DOSING INPATIENT: Freq: Every day | Status: DC

## 2022-07-10 MED ORDER — WARFARIN SODIUM 2.5 MG PO TABS
1.2500 mg | ORAL_TABLET | Freq: Every day | ORAL | Status: DC
Start: 2022-07-10 — End: 2022-08-12

## 2022-07-10 MED ORDER — WARFARIN SODIUM 2 MG PO TABS
2.0000 mg | ORAL_TABLET | Freq: Once | ORAL | Status: DC
  Filled 2022-07-10: qty 1

## 2022-07-10 MED ORDER — SULFAMETHOXAZOLE-TRIMETHOPRIM 800-160 MG PO TABS: 1.0000 | ORAL_TABLET | Freq: Every day | ORAL | 0 refills | Status: DC

## 2022-07-10 NOTE — Discharge Summary (Signed)
Physician Discharge Summary  [name] [name] ZOX:[id number] DOB: January 13, 1941 DOA: 07/07/2022  PCP: [name], MD  Admit date: 07/07/2022 Discharge date: 07/10/2022  Admitted From: Home Disposition: Home  Recommendations for Outpatient Follow-up:  Follow up with PCP in 1-2 weeks Please obtain BMP/CBC in one week Follow-up with INR clinic next week. Urology to schedule follow-up  Home Health: N/A Equipment/Devices: Going home with Foley catheter  Discharge Condition: Stable CODE STATUS: Full code Diet recommendation: Low-salt diet, low-carb diet  Discharge summary: 82 year old with history of ESBL UTI , insulin-dependent type 2 diabetes, ischemic cardiomyopathy, CKD stage IV, paroxysmal A-fib on Coumadin and metoprolol admitted after presenting with blood in urine.  Recently she was found to have INR of 7.9 on 5/6, she was told to hold her warfarin.  Patient started having difficulty voiding and blood in urine.  Admitted with hematuria, supratherapeutic INR of 5.2.  Treated with urology consultation.  She had continuous bladder irrigation.  Urine culture with pansensitive E. coli, ESBL Klebsiella.  Stabilized.  Going home today.     Hemorrhagic cystitis, urinary bladder bleeding secondary to supratherapeutic INR: Presented with hematuria and suprapubic discomfort. CT scan showed hydronephrosis and blood clot in the urinary bladder. Followed by urology, treated with CBI, ultimately stabilized.  Now no evidence of ongoing bleeding. Urology recommended to discharge with Foley catheter, they will schedule for voiding trial. Blood cultures negative.  Urine culture with pansensitive E. coli, ESBL Klebsiella.  Patient is on day 3 of meropenem.  With medical stability, will treat with renally dosed Bactrim for additional 10 days. Urology to schedule follow-up.   Supratherapeutic INR secondary to Coumadin use: Reversed.  Normalized.  No evidence of ongoing bleeding. Discussed about NOAC,  patient does not want to consider anything other than Coumadin. Patient will restart Coumadin 1.25 mg daily today and gradually go up.  She will follow-up at Coumadin clinic next week.   Chronic medical issues including Essential hypertension, on metoprolol GERD, on PPI Paroxysmal A-fib, as above.  Currently in sinus rhythm.  On metoprolol.  Coumadin to resume tonight. CKD stage IV known baseline creatinine of 1.6-1.8.  At about baseline. Type 2 diabetes: On insulin.  Continue.  Patient medically stable for discharge.  Discharge instructions discussed with patient and her husband at the bedside. Discussed about only option to use Bactrim and it has significant interaction with Coumadin so it is necessary to follow-up very closely at Coumadin clinic.  Will discharge patient on smaller dose of Coumadin to avoid complications.   Discharge Diagnoses:  Principal Problem:   Gross hematuria Active Problems:   Essential hypertension   GERD (gastroesophageal reflux disease)   CAD S/P percutaneous coronary angioplasty   PAF (paroxysmal atrial fibrillation) (HCC)   Cardiomyopathy (HCC)   CKD (chronic kidney disease) stage 4, GFR 15-29 ml/min (HCC)   Supratherapeutic INR   Hemorrhagic cystitis    Discharge Instructions  Discharge Instructions     Diet Carb Modified   Complete by: As directed    Discharge instructions   Complete by: As directed    take coumadin 1.25 mg daily until next INR check at coumadin clinic   Increase activity slowly   Complete by: As directed       Allergies as of 07/10/2022       Reactions   Procaine Hcl Anaphylaxis   Amoxicillin Itching        Medication List     TAKE these medications    acetaminophen 325 MG tablet Commonly  known as: TYLENOL Take 325 mg by mouth daily at 6 (six) AM.   atorvastatin 80 MG tablet Commonly known as: LIPITOR Take 1 tablet by mouth once daily   denosumab 60 MG/ML Sosy injection Commonly known as:  PROLIA Inject 60 mg into the skin every 6 (six) months.   diphenhydramine-acetaminophen 25-500 MG Tabs tablet Commonly known as: TYLENOL PM Take 1 tablet by mouth at bedtime as needed (Prn as needed.).   ezetimibe 10 MG tablet Commonly known as: ZETIA Take 1 tablet (10 mg total) by mouth daily.   furosemide 40 MG tablet Commonly known as: LASIX TAKE 1 TABLET BY MOUTH TWICE DAILY AS NEEDED FOR FLUID What changed: See the new instructions.   metoprolol succinate 50 MG 24 hr tablet Commonly known as: TOPROL-XL TAKE 1 TABLET BY MOUTH ONCE DAILY . APPOINTMENT REQUIRED FOR FUTURE REFILLS What changed: See the new instructions.   MULTIVITAMIN ADULT PO Take 1 tablet by mouth daily with breakfast.   NovoLIN 70/30 ReliOn (70-30) 100 UNIT/ML injection Generic drug: insulin NPH-regular Human 26 units with breakfast and 2 units with the evening meal.   sulfamethoxazole-trimethoprim 800-160 MG tablet Commonly known as: BACTRIM DS Take 1 tablet by mouth daily for 10 doses.   VITAMIN D (CHOLECALCIFEROL) PO Take by mouth.   warfarin 2.5 MG tablet Commonly known as: COUMADIN Take as directed. If you are unsure how to take this medication, talk to your nurse or doctor. Original instructions: Take 0.5 tablets (1.25 mg total) by mouth daily. What changed:  how much to take how to take this when to take this additional instructions        Allergies  Allergen Reactions   Procaine Hcl Anaphylaxis   Amoxicillin Itching    Consultations: Urology   Procedures/Studies: CT Renal Stone Study  Result Date: 07/07/2022 CLINICAL DATA:  82 year old female with abdominal/flank pain and decreased urine output. Stone suspected. Hematuria. EXAM: CT ABDOMEN AND PELVIS WITHOUT CONTRAST TECHNIQUE: Multidetector CT imaging of the abdomen and pelvis was performed following the standard protocol without IV contrast. RADIATION DOSE REDUCTION: This exam was performed according to the departmental  dose-optimization program which includes automated exposure control, adjustment of the mA and/or kV according to patient size and/or use of iterative reconstruction technique. COMPARISON:  CT abdomen and pelvis 01/20/2022 FINDINGS: Lower chest: No acute abnormality.  Mitral annular calcification. Hepatobiliary: Cholelithiasis. No evidence of cholecystitis. No focal hepatic lesion on noncontrast exam. Pancreas: No acute abnormality. Atrophic. Stable cystic lesions in the pancreatic tail measuring up to 1.3 cm. Spleen: Unremarkable. Adrenals/Urinary Tract: Stable adrenal glands. Bilateral cortical renal scarring. Asymmetric left perinephric stranding prominent bilateral renal pelves. Nonobstructing punctate left nephrolithiasis. Moderate left hydroureteronephrosis. Heterogenous high density fluid within the bladder. Foley catheter balloon in the bladder. Gas within the bladder. Diffuse bladder wall thickening with adjacent perivesicular stranding. There are a few calcific densities within the bladder. Stomach/Bowel: Normal caliber large and small bowel. Colonic diverticulosis without diverticulitis. Normal appendix. Small hiatal hernia. Stomach is within normal limits. Vascular/Lymphatic: Extensive aortic atherosclerotic calcification. No lymphadenopathy. Reproductive: Uterus and bilateral adnexa are unremarkable. Other: Smaller anterior toward in the pelvis. No free intraperitoneal air. Musculoskeletal: Bilateral postoperative changes about the proximal femurs. No acute osseous abnormality. Chronic T11 and L1 compression fractures. Demineralization. Thoracolumbar spondylosis. IMPRESSION: 1. Constellation of findings suggestive of hemorrhagic cystitis. Asymmetric stranding about the left kidney with left hydroureteronephrosis could be due to obstruction from clot within the bladder or pyelonephritis. Underlying bladder malignancy not excluded. Urology consultation recommended.  2. Nonobstructing punctate left  nephrolithiasis. 3. Cholelithiasis. 4. Colonic diverticulosis without diverticulitis. 5. Stable cystic lesions in the pancreatic tail measuring up to 1.3 cm. Aortic Atherosclerosis (ICD10-I70.0). Electronically Signed   By: Minerva Fester M.D.   On: 07/07/2022 18:27   DG Chest 2 View  Result Date: 06/30/2022 CLINICAL DATA:  Left-sided flank pain and chest pain, initial encounter EXAM: CHEST - 2 VIEW COMPARISON:  01/20/2022 FINDINGS: Cardiac shadow is stable. Aortic calcifications are seen. The lungs are well aerated without focal infiltrate. Healing right rib fracture is noted involving the posterior sixth rib. IMPRESSION: No acute abnormality noted. Electronically Signed   By: Alcide Clever M.D.   On: 06/30/2022 00:07   (Echo, Carotid, EGD, Colonoscopy, ERCP)    Subjective: Patient seen and examined.  Denies any complaints.  Husband at the bedside.   Discharge Exam: Vitals:   07/09/22 2148 07/10/22 0548  BP: (!) 163/68 (!) 153/55  Pulse: (!) 57 62  Resp: 16 18  Temp: (!) 97.4 F (36.3 C) 97.7 F (36.5 C)  SpO2: 100% 98%   Vitals:   07/09/22 1002 07/09/22 1409 07/09/22 2148 07/10/22 0548  BP: (!) 157/62 (!) 158/54 (!) 163/68 (!) 153/55  Pulse: 63 60 (!) 57 62  Resp: 16 18 16 18   Temp: 97.7 F (36.5 C) 97.8 F (36.6 C) (!) 97.4 F (36.3 C) 97.7 F (36.5 C)  TempSrc: Oral Oral Oral Oral  SpO2: 99% 97% 100% 98%  Weight:      Height:        General: Pt is alert, awake, not in acute distress Cardiovascular: RRR, S1/S2 +, no rubs, no gallops Respiratory: CTA bilaterally, no wheezing, no rhonchi Abdominal: Soft, NT, ND, bowel sounds + Extremities: no edema, no cyanosis Foley catheter with clear urine.    The results of significant diagnostics from this hospitalization (including imaging, microbiology, ancillary and laboratory) are listed below for reference.     Microbiology: Recent Results (from the past 240 hour(s))  Urine Culture     Status: Abnormal   Collection  Time: 07/07/22  7:46 PM   Specimen: Urine, Clean Catch  Result Value Ref Range Status   Specimen Description   Final    URINE, CLEAN CATCH Performed at [facility], [address] [place], [place] [address]    Special Requests   Final    NONE Performed at [facility], [address] [place], [place] [address]    Culture (A)  Final    >=100,000 COLONIES/mL ESCHERICHIA COLI 80,000 COLONIES/mL KLEBSIELLA PNEUMONIAE Confirmed Extended Spectrum Beta-Lactamase Producer (ESBL).  In bloodstream infections from ESBL organisms, carbapenems are preferred over piperacillin/tazobactam. They are shown to have a lower risk of mortality.    Report Status 07/10/2022 FINAL  Final   Organism ID, Bacteria ESCHERICHIA COLI (A)  Final   Organism ID, Bacteria KLEBSIELLA PNEUMONIAE (A)  Final      Susceptibility   Escherichia coli - MIC*    AMPICILLIN <=2 SENSITIVE Sensitive     CEFAZOLIN <=4 SENSITIVE Sensitive     CEFEPIME <=0.12 SENSITIVE Sensitive     CEFTRIAXONE <=0.25 SENSITIVE Sensitive     CIPROFLOXACIN <=0.25 SENSITIVE Sensitive     GENTAMICIN <=1 SENSITIVE Sensitive     IMIPENEM <=0.25 SENSITIVE Sensitive     NITROFURANTOIN <=16 SENSITIVE Sensitive     TRIMETH/SULFA <=20 SENSITIVE Sensitive     AMPICILLIN/SULBACTAM <=2 SENSITIVE Sensitive     PIP/TAZO <=4 SENSITIVE Sensitive     * >=  100,000 COLONIES/mL ESCHERICHIA COLI   Klebsiella pneumoniae - MIC*    AMPICILLIN >=32 RESISTANT Resistant     CEFAZOLIN >=64 RESISTANT Resistant     CEFEPIME >=32 RESISTANT Resistant     CEFTRIAXONE >=64 RESISTANT Resistant     CIPROFLOXACIN >=4 RESISTANT Resistant     GENTAMICIN <=1 SENSITIVE Sensitive     IMIPENEM 1 SENSITIVE Sensitive     NITROFURANTOIN 64 INTERMEDIATE Intermediate     TRIMETH/SULFA <=20 SENSITIVE Sensitive     AMPICILLIN/SULBACTAM >=32 RESISTANT Resistant     PIP/TAZO <=4 SENSITIVE Sensitive     * 80,000 COLONIES/mL KLEBSIELLA PNEUMONIAE  Culture, blood  (Routine X 2) w Reflex to ID Panel     Status: None (Preliminary result)   Collection Time: 07/08/22 11:15 AM   Specimen: BLOOD RIGHT ARM  Result Value Ref Range Status   Specimen Description   Final    BLOOD RIGHT ARM Performed at [facility], [address], [place], [place] [address]    Special Requests   Final    AEROBIC BOTTLE ONLY Blood Culture adequate volume Performed at [facility], [address] [place], [place] [address]    Culture   Final    NO GROWTH 2 DAYS Performed at [facility], [address], [place], [place] [address]    Report Status PENDING  Incomplete  Culture, blood (Routine X 2) w Reflex to ID Panel     Status: None (Preliminary result)   Collection Time: 07/08/22 11:15 AM   Specimen: BLOOD RIGHT HAND  Result Value Ref Range Status   Specimen Description   Final    BLOOD RIGHT HAND Performed at [facility], [address], [place], [place] [address]    Special Requests   Final    AEROBIC BOTTLE ONLY Blood Culture adequate volume Performed at [facility], [address], [place], [place] [address]    Culture   Final    NO GROWTH 2 DAYS Performed at [facility], [address], [place], [place] [address]    Report Status PENDING  Incomplete     Labs: BNP (last 3 results) No results for input(s): "BNP" in the last 8760 hours. Basic Metabolic Panel: Recent Labs  Lab 07/07/22 1637 07/08/22 0633 07/09/22 0348 07/10/22 0343  NA 136 136 136 134*  K 4.0 4.6 4.9 4.5  CL 97* 100 103 102  CO2 26 27 24 23   GLUCOSE 147* 180* 200* 127*  BUN 29* 29* 29* 23  CREATININE 1.83* 1.74* 1.66* 1.41*  CALCIUM 8.5* 8.1* 7.7* 7.6*  MG  --   --   --  1.8   Liver Function Tests: Recent Labs  Lab 07/07/22 1637  AST 39  ALT 19  ALKPHOS 79  BILITOT 0.7  PROT 7.3  ALBUMIN 2.7*   No results for input(s): "LIPASE", "AMYLASE" in the last 168 hours. No results for  input(s): "AMMONIA" in the last 168 hours. CBC: Recent Labs  Lab 07/07/22 1637 07/08/22 0104 07/08/22 0633 07/09/22 0348 07/10/22 0343  WBC 14.8* 13.1* 13.1* 12.4* 9.2  NEUTROABS 11.0* 9.6* 9.3*  --  6.0  HGB 11.1* 9.8* 10.2* 10.2* 10.5*  HCT 34.1* 29.6* 31.5* 31.5* 32.5*  MCV 92.2 90.8 91.6 95.5 93.9  PLT 574* 482* 495* 504* 524*   Cardiac Enzymes: No results for input(s): "CKTOTAL", "CKMB", "CKMBINDEX", "TROPONINI" in the last 168 hours. BNP: Invalid input(s): "POCBNP" CBG: Recent Labs  Lab 07/09/22  1155 07/09/22 1647 07/09/22 2201 07/10/22 0804 07/10/22 1205  GLUCAP 118* 124* 120* 146* 148*   D-Dimer No results for input(s): "DDIMER" in the last 72 hours. Hgb A1c No results for input(s): "HGBA1C" in the last 72 hours. Lipid Profile No results for input(s): "CHOL", "HDL", "LDLCALC", "TRIG", "CHOLHDL", "LDLDIRECT" in the last 72 hours. Thyroid function studies No results for input(s): "TSH", "T4TOTAL", "T3FREE", "THYROIDAB" in the last 72 hours.  Invalid input(s): "FREET3" Anemia work up No results for input(s): "VITAMINB12", "FOLATE", "FERRITIN", "TIBC", "IRON", "RETICCTPCT" in the last 72 hours. Urinalysis    Component Value Date/Time   COLORURINE RED (A) 07/07/2022 1637   APPEARANCEUR TURBID (A) 07/07/2022 1637   LABSPEC  07/07/2022 1637    TEST NOT REPORTED DUE TO COLOR INTERFERENCE OF URINE PIGMENT   PHURINE  07/07/2022 1637    TEST NOT REPORTED DUE TO COLOR INTERFERENCE OF URINE PIGMENT   GLUCOSEU (A) 07/07/2022 1637    TEST NOT REPORTED DUE TO COLOR INTERFERENCE OF URINE PIGMENT   GLUCOSEU NEGATIVE 05/06/2022 1103   HGBUR (A) 07/07/2022 1637    TEST NOT REPORTED DUE TO COLOR INTERFERENCE OF URINE PIGMENT   BILIRUBINUR (A) 07/07/2022 1637    TEST NOT REPORTED DUE TO COLOR INTERFERENCE OF URINE PIGMENT   BILIRUBINUR neg 05/16/2015 1928   KETONESUR (A) 07/07/2022 1637    TEST NOT REPORTED DUE TO COLOR INTERFERENCE OF URINE PIGMENT   PROTEINUR (A)  07/07/2022 1637    TEST NOT REPORTED DUE TO COLOR INTERFERENCE OF URINE PIGMENT   UROBILINOGEN 0.2 05/06/2022 1103   NITRITE (A) 07/07/2022 1637    TEST NOT REPORTED DUE TO COLOR INTERFERENCE OF URINE PIGMENT   LEUKOCYTESUR (A) 07/07/2022 1637    TEST NOT REPORTED DUE TO COLOR INTERFERENCE OF URINE PIGMENT   Sepsis Labs Recent Labs  Lab 07/08/22 0104 07/08/22 0633 07/09/22 0348 07/10/22 0343  WBC 13.1* 13.1* 12.4* 9.2   Microbiology Recent Results (from the past 240 hour(s))  Urine Culture     Status: Abnormal   Collection Time: 07/07/22  7:46 PM   Specimen: Urine, Clean Catch  Result Value Ref Range Status   Specimen Description   Final    URINE, CLEAN CATCH Performed at [facility], [address] [place], [place] [address]    Special Requests   Final    NONE Performed at [facility], [address] [place], [place] [address]    Culture (A)  Final    >=100,000 COLONIES/mL ESCHERICHIA COLI 80,000 COLONIES/mL KLEBSIELLA PNEUMONIAE Confirmed Extended Spectrum Beta-Lactamase Producer (ESBL).  In bloodstream infections from ESBL organisms, carbapenems are preferred over piperacillin/tazobactam. They are shown to have a lower risk of mortality.    Report Status 07/10/2022 FINAL  Final   Organism ID, Bacteria ESCHERICHIA COLI (A)  Final   Organism ID, Bacteria KLEBSIELLA PNEUMONIAE (A)  Final      Susceptibility   Escherichia coli - MIC*    AMPICILLIN <=2 SENSITIVE Sensitive     CEFAZOLIN <=4 SENSITIVE Sensitive     CEFEPIME <=0.12 SENSITIVE Sensitive     CEFTRIAXONE <=0.25 SENSITIVE Sensitive     CIPROFLOXACIN <=0.25 SENSITIVE Sensitive     GENTAMICIN <=1 SENSITIVE Sensitive     IMIPENEM <=0.25 SENSITIVE Sensitive     NITROFURANTOIN <=16 SENSITIVE Sensitive     TRIMETH/SULFA <=20 SENSITIVE Sensitive     AMPICILLIN/SULBACTAM <=2 SENSITIVE Sensitive     PIP/TAZO <=4 SENSITIVE Sensitive     * >=  100,000 COLONIES/mL ESCHERICHIA COLI   Klebsiella  pneumoniae - MIC*    AMPICILLIN >=32 RESISTANT Resistant     CEFAZOLIN >=64 RESISTANT Resistant     CEFEPIME >=32 RESISTANT Resistant     CEFTRIAXONE >=64 RESISTANT Resistant     CIPROFLOXACIN >=4 RESISTANT Resistant     GENTAMICIN <=1 SENSITIVE Sensitive     IMIPENEM 1 SENSITIVE Sensitive     NITROFURANTOIN 64 INTERMEDIATE Intermediate     TRIMETH/SULFA <=20 SENSITIVE Sensitive     AMPICILLIN/SULBACTAM >=32 RESISTANT Resistant     PIP/TAZO <=4 SENSITIVE Sensitive     * 80,000 COLONIES/mL KLEBSIELLA PNEUMONIAE  Culture, blood (Routine X 2) w Reflex to ID Panel     Status: None (Preliminary result)   Collection Time: 07/08/22 11:15 AM   Specimen: BLOOD RIGHT ARM  Result Value Ref Range Status   Specimen Description   Final    BLOOD RIGHT ARM Performed at [facility], [address] [place], [place] [address]    Special Requests   Final    AEROBIC BOTTLE ONLY Blood Culture adequate volume Performed at [facility], [address], [place], [place] [address]    Culture   Final    NO GROWTH 2 DAYS Performed at [facility], [address], [place], [place] [address]    Report Status PENDING  Incomplete  Culture, blood (Routine X 2) w Reflex to ID Panel     Status: None (Preliminary result)   Collection Time: 07/08/22 11:15 AM   Specimen: BLOOD RIGHT HAND  Result Value Ref Range Status   Specimen Description   Final    BLOOD RIGHT HAND Performed at [facility], [address] [place], [place] [address]    Special Requests   Final    AEROBIC BOTTLE ONLY Blood Culture adequate volume Performed at [facility], [address] [place], [place] [address]    Culture   Final    NO GROWTH 2 DAYS Performed at [facility], [address], [place], [place] [address]    Report Status PENDING  Incomplete     Time coordinating discharge: 32 minutes  SIGNED:   Dorcas Carrow, MD  [facility] 07/10/2022, 1:22 PM

## 2022-07-10 NOTE — Plan of Care (Signed)
  Problem: Education: Goal: Knowledge of General Education information will improve Description: Including pain rating scale, medication(s)/side effects and non-pharmacologic comfort measures Outcome: Adequate for Discharge   Problem: Health Behavior/Discharge Planning: Goal: Ability to manage health-related needs will improve Outcome: Adequate for Discharge   Problem: Clinical Measurements: Goal: Ability to maintain clinical measurements within normal limits will improve Outcome: Adequate for Discharge Goal: Will remain free from infection Outcome: Adequate for Discharge Goal: Diagnostic test results will improve Outcome: Adequate for Discharge Goal: Respiratory complications will improve Outcome: Adequate for Discharge Goal: Cardiovascular complication will be avoided Outcome: Adequate for Discharge   Problem: Activity: Goal: Risk for activity intolerance will decrease 07/10/2022 1330 by Alice Rieger A, LPN Outcome: Adequate for Discharge 07/10/2022 1138 by Delila Spence, LPN Outcome: Progressing   Problem: Nutrition: Goal: Adequate nutrition will be maintained Outcome: Adequate for Discharge   Problem: Coping: Goal: Level of anxiety will decrease Outcome: Adequate for Discharge   Problem: Elimination: Goal: Will not experience complications related to bowel motility Outcome: Adequate for Discharge Goal: Will not experience complications related to urinary retention 07/10/2022 1330 by Delila Spence, LPN Outcome: Adequate for Discharge 07/10/2022 1138 by Alice Rieger A, LPN Outcome: Progressing   Problem: Pain Managment: Goal: General experience of comfort will improve Outcome: Adequate for Discharge   Problem: Safety: Goal: Ability to remain free from injury will improve 07/10/2022 1330 by Delila Spence, LPN Outcome: Adequate for Discharge 07/10/2022 1138 by Alice Rieger A, LPN Outcome: Progressing   Problem: Skin Integrity: Goal: Risk for impaired  skin integrity will decrease Outcome: Adequate for Discharge

## 2022-07-10 NOTE — Progress Notes (Addendum)
  Subjective: History of Present Illness: [name] [name] is an 82 year old female presenting to [facility] in transfer from [facility] where she was found to have supratherapeutic INR to 5.2 with ongoing hemorrhagic cystitis and left-sided hydroureteronephrosis.  PMH significant for ESBL UTI, T2DM, ischemic cardiomyopathy, CKD stage III, and PAF on chronic Coumadin.  [facility] was [facility] to address abnormal CT findings and hematuria.   5/10: NAEON. Pt feels well today with no complaints. Eager to go home  Objective: Vital signs in last 24 hours: Temp:  [97.4 F (36.3 C)-97.8 F (36.6 C)] 97.7 F (36.5 C) (05/10 0548) Pulse Rate:  [57-62] 62 (05/10 0548) Resp:  [16-18] 18 (05/10 0548) BP: (153-163)/(54-68) 153/55 (05/10 0548) SpO2:  [97 %-100 %] 98 % (05/10 0548)  Intake/Output from previous day: 05/09 0701 - 05/10 0700 In: 1420 [P.O.:720; IV Piggyback:100] Out: 3100 [Urine:3100]  Intake/Output this shift: Total I/O In: -  Out: 250 [Urine:250]  Physical Exam:  General: Alert and oriented CV: No cyanosis Lungs: equal chest rise Abdomen: Soft, NTND, no rebound or guarding Gu: 14f Rusch hematuria catheter in place.  Clear yellow urine. CBI port plugged  Lab Results: Recent Labs    07/08/22 0633 07/09/22 0348 07/10/22 0343  HGB 10.2* 10.2* 10.5*  HCT 31.5* 31.5* 32.5*   BMET Recent Labs    07/09/22 0348 07/10/22 0343  NA 136 134*  K 4.9 4.5  CL 103 102  CO2 24 23  GLUCOSE 200* 127*  BUN 29* 23  CREATININE 1.66* 1.41*  CALCIUM 7.7* 7.6*     Studies/Results: No results found.  Assessment/Plan: Continued interval improvement in serum creatinine: 1.74-1.66-1.41.  Lukocytosis has resolved. NGTD on blood cultures.  Pansensitive E. coli resistant Klebsiella on urinary tract infection.  Thankfully it appears to be sensitive to Bactrim on both count so she will have an oral option for discharge.  Would continue 2 weeks of  antibiotics and have patient follow-up in clinic for consideration of repeat imaging, voiding trial, and reassessment in 1 week.   Lactic acidosis has normalized  CBI off since yesterday with clear urine.  Sediment present in tubing.  Would recommend catheter staying in place for at least a week for maximal decompression while treating emphysematous cystitis.  I will contact our schedulers today to work on her follow-up.  We will plan on seeing her again at the beginning of the week.  If she is found to be appropriate for discharge prior to that, please contact on-call.   LOS: 2 days   Elmon Kirschner, NP [facility] Pager: [phone]  07/10/2022, 10:20 AM

## 2022-07-10 NOTE — Plan of Care (Signed)
  Problem: Activity: Goal: Risk for activity intolerance will decrease Outcome: Progressing   Problem: Elimination: Goal: Will not experience complications related to urinary retention Outcome: Progressing   Problem: Safety: Goal: Ability to remain free from injury will improve Outcome: Progressing   

## 2022-07-10 NOTE — Progress Notes (Signed)
Urine light pink/clear with no clots last night.

## 2022-07-11 LAB — CULTURE, BLOOD (ROUTINE X 2): Special Requests: ADEQUATE

## 2022-07-12 LAB — CULTURE, BLOOD (ROUTINE X 2): Special Requests: ADEQUATE

## 2022-07-13 ENCOUNTER — Encounter: Payer: Self-pay | Admitting: *Deleted

## 2022-07-13 ENCOUNTER — Telehealth: Payer: Self-pay | Admitting: *Deleted

## 2022-07-13 NOTE — Transitions of Care (Post Inpatient/ED Visit) (Signed)
07/13/2022  Name: [name] [name] MRN: [id number] DOB: 01-18-1941  Today's TOC FU Call Status: Today's TOC FU Call Status:: Successful TOC FU Call Competed TOC FU Call Complete Date: 07/13/22  Transition Care Management Follow-up Telephone Call Date of Discharge: 07/10/22 Discharge Facility: [facility]) Type of Discharge: Inpatient Admission Primary Inpatient Discharge Diagnosis:: gross hematuria secondary to supratherapeutic INR- Klebsiella/ E-Coli in urine How have you been since you were released from the hospital?: Better ("I am doing okay but I am a bit irritated because they told me to follow up and make an appointment with the urology doctor this week-- but they didn't give me the phone number or the name of the doctor; I am managing the catheter just fine") Any questions or concerns?: Yes Patient Questions/Concerns:: Was not provided contact information for specialist/ urology provider Patient Questions/Concerns Addressed: Other: (provided contact information for urology provider; encouraged patient to make prompt apointments with both specialist and with PCP-- she adamantly declines my assistance in scheduling PCP appointment- wants to see specialist first)  Items Reviewed: Did you receive and understand the discharge instructions provided?: Yes (thoroughly reviewed with patient who verbalizes good understanding of same) Medications obtained,verified, and reconciled?: Yes (Medications Reviewed) (Full medication reconciliation/ review completed; no concerns or discrepancies identified; confirmed patient obtained/ is taking all newly Rx'd medications as instructed; self-manages medications and denies questions/ concerns around medications today) Dietary orders reviewed?: Yes Type of Diet Ordered:: "Healthy"/ Regular Do you have support at home?: Yes People in Home: spouse Name of Support/Comfort Primary Source: Reports independent in most self-care activities; supportive  spouse assists as/ if needed/ indicated  Medications Reviewed Today: Medications Reviewed Today     Reviewed by Michaela Corner, RN (Registered Nurse) on 07/13/22 at 1038  Med List Status: <None>   Medication Order Taking? Sig Documenting Provider Last Dose Status Informant  acetaminophen (TYLENOL) 325 MG tablet [id number]  Take 325 mg by mouth daily at 6 (six) AM. [provider]  Active Spouse/Significant Other, Pharmacy Records  atorvastatin (LIPITOR) 80 MG tablet [id number]  Take 1 tablet by mouth once daily [name], MD  Active Spouse/Significant Other, Pharmacy Records  denosumab (PROLIA) 60 MG/ML SOSY injection [id number]  Inject 60 mg into the skin every 6 (six) months. [provider]  Active Spouse/Significant Other, Pharmacy Records           Med Note (CARD, AMY L   Wed Jul 08, 2022  3:01 PM) Every 6 months.  diphenhydramine-acetaminophen (TYLENOL PM) 25-500 MG TABS tablet [id number]  Take 1 tablet by mouth at bedtime as needed (Prn as needed.). [provider]  Active Spouse/Significant Other, Pharmacy Records  ezetimibe (ZETIA) 10 MG tablet [id number]  Take 1 tablet (10 mg total) by mouth daily. Chilton Si, MD  Active Spouse/Significant Other, Pharmacy Records  furosemide (LASIX) 40 MG tablet [id number]  TAKE 1 TABLET BY MOUTH TWICE DAILY AS NEEDED FOR FLUID  Patient taking differently: Take 40 mg by mouth 2 (two) times daily.   [name], MD  Active Spouse/Significant Other, Pharmacy Records  insulin NPH-regular Human (NOVOLIN 70/30 RELION) (70-30) 100 UNIT/ML injection [id number]  26 units with breakfast and 2 units with the evening meal. Romero Belling, MD  Active Spouse/Significant Other, Pharmacy Records  metoprolol succinate (TOPROL-XL) 50 MG 24 hr tablet [id number]  TAKE 1 TABLET BY MOUTH ONCE DAILY . APPOINTMENT REQUIRED FOR FUTURE REFILLS  Patient taking differently: Take 50 mg by mouth daily.   Chilton Si, MD  Active Spouse/Significant  Other, Pharmacy Records  Multiple Vitamins-Minerals (MULTIVITAMIN ADULT PO) [id number]  Take 1 tablet by mouth daily with breakfast.  [provider]  Active Spouse/Significant Other, Pharmacy Records  sulfamethoxazole-trimethoprim (BACTRIM DS) 800-160 MG tablet [id number]  Take 1 tablet by mouth daily for 10 doses. Dorcas Carrow, MD  Active   VITAMIN D, CHOLECALCIFEROL, PO [id number]  Take by mouth. [provider]  Active Spouse/Significant Other, Pharmacy Records  warfarin (COUMADIN) 2.5 MG tablet [id number]  Take 0.5 tablets (1.25 mg total) by mouth daily. Dorcas Carrow, MD  Active             Home Care and Equipment/Supplies: Were Home Health Services Ordered?: No Any new equipment or medical supplies ordered?: No  Functional Questionnaire: Do you need assistance with bathing/showering or dressing?: Yes (husband assists as needed/ indicated) Do you need assistance with meal preparation?: Yes (husband assists as needed/ indicated) Do you need assistance with eating?: No Do you have difficulty maintaining continence: No Do you need assistance with getting out of bed/getting out of a chair/moving?: No (husband assists as needed/ indicated) Do you have difficulty managing or taking your medications?: Yes (husband assists with medication preparation/ administration)  Follow up appointments reviewed: PCP Follow-up appointment confirmed?: No (patient adamantly declines my offer to assist with scheduling hospital follow up appointment: wants to make urology specialist appointment first) MD Provider Line [phone] Given: No (verified well-established with current PCP) Specialist Hospital Follow-up appointment confirmed?: No Reason Specialist Follow-Up Not Confirmed: Patient has Specialist Provider Number and will Call for Appointment (provided contact information for urology specialist provider) Do you need transportation to your follow-up appointment?: No Do you  understand care options if your condition(s) worsen?: Yes-patient verbalized understanding  SDOH Interventions Today    Flowsheet Row Most Recent Value  SDOH Interventions   Food Insecurity Interventions Intervention Not Indicated  Transportation Interventions Intervention Not Indicated  [spouse provides transportation]      TOC Interventions Today    Flowsheet Row Most Recent Value  TOC Interventions   TOC Interventions Discussed/Reviewed TOC Interventions Discussed  [Patient declines need for ongoing/ further care coordination outreach,  no care coordination needs identified at time of TOC call today,  provided my direct contact information should questions/ concerns/ needs arise post-TOC call]      Interventions Today    Flowsheet Row Most Recent Value  Chronic Disease   Chronic disease during today's visit Other  [gross hematuria/ UTI]  General Interventions   General Interventions Discussed/Reviewed Doctor Visits, General Interventions Discussed, Durable Medical Equipment (DME)  Doctor Visits Discussed/Reviewed Specialist, Doctor Visits Discussed, PCP  Durable Medical Equipment (DME) Dan Humphreys  [patient reports currently uses walker "most of the time"]  PCP/Specialist Visits Compliance with follow-up visit  Education Interventions   Education Provided Provided Education  Provided Verbal Education On Medication, When to see the doctor, Other  [potential benefit of probiotics in setting of antibiotic therapy,  reinforced foley catheter care]  Nutrition Interventions   Nutrition Discussed/Reviewed Nutrition Discussed  Pharmacy Interventions   Pharmacy Dicussed/Reviewed Pharmacy Topics Discussed  [Full medication review with updating medication list in EHR per patient report]  Safety Interventions   Safety Discussed/Reviewed Safety Discussed, Fall Risk      Caryl Pina, RN, BSN, CCRN Alumnus RN CM Care Coordination/ Transition of Care- [facility] Care Management [phone]: direct office

## 2022-07-15 ENCOUNTER — Telehealth: Payer: Self-pay | Admitting: *Deleted

## 2022-07-15 ENCOUNTER — Ambulatory Visit: Payer: Medicare Other

## 2022-07-15 NOTE — Telephone Encounter (Signed)
Husband called and stated the pt cannot come to the appt today. Rescheduled appt to Friday per request.  Looked at her hospital discharge and she is on bactrim, pt is taking 1/2 tablet of warfarin daily and bactrim qd, advised to eat more leafy veggies and risk associated with high INR's-bleeding and he verbalized understanding.

## 2022-07-17 ENCOUNTER — Telehealth: Payer: Self-pay

## 2022-07-17 ENCOUNTER — Ambulatory Visit: Payer: Medicare Other

## 2022-07-17 NOTE — Telephone Encounter (Signed)
I spoke to the patient's husband who had to cancel wife's appt because she is not feeling well.  Since she is still on Bactrim until 5/20, I had her take only 0.5 tablet daily of Coumadin and eat greens until then.  He will call Monday to reschedule.

## 2022-07-20 ENCOUNTER — Inpatient Hospital Stay (HOSPITAL_COMMUNITY)
Admission: EM | Admit: 2022-07-20 | Discharge: 2022-07-23 | DRG: 683 | Disposition: A | Discharge status: Home Health | Payer: Medicare Other | Attending: Internal Medicine | Admitting: Internal Medicine

## 2022-07-20 ENCOUNTER — Telehealth: Payer: Self-pay | Admitting: Urology

## 2022-07-20 ENCOUNTER — Emergency Department (HOSPITAL_COMMUNITY): Payer: Medicare Other

## 2022-07-20 ENCOUNTER — Other Ambulatory Visit: Payer: Self-pay

## 2022-07-20 ENCOUNTER — Telehealth: Payer: Self-pay

## 2022-07-20 ENCOUNTER — Inpatient Hospital Stay: Payer: Medicare Other | Admitting: Internal Medicine

## 2022-07-20 ENCOUNTER — Encounter (HOSPITAL_COMMUNITY): Payer: Self-pay

## 2022-07-20 DIAGNOSIS — E1122 Type 2 diabetes mellitus with diabetic chronic kidney disease: Secondary | ICD-10-CM | POA: Diagnosis present

## 2022-07-20 DIAGNOSIS — E119 Type 2 diabetes mellitus without complications: Secondary | ICD-10-CM

## 2022-07-20 DIAGNOSIS — B961 Klebsiella pneumoniae [K. pneumoniae] as the cause of diseases classified elsewhere: Secondary | ICD-10-CM | POA: Diagnosis present

## 2022-07-20 DIAGNOSIS — I4891 Unspecified atrial fibrillation: Secondary | ICD-10-CM | POA: Diagnosis present

## 2022-07-20 DIAGNOSIS — I255 Ischemic cardiomyopathy: Secondary | ICD-10-CM | POA: Diagnosis present

## 2022-07-20 DIAGNOSIS — Z8249 Family history of ischemic heart disease and other diseases of the circulatory system: Secondary | ICD-10-CM

## 2022-07-20 DIAGNOSIS — N210 Calculus in bladder: Secondary | ICD-10-CM | POA: Diagnosis present

## 2022-07-20 DIAGNOSIS — Z960 Presence of urogenital implants: Secondary | ICD-10-CM | POA: Diagnosis present

## 2022-07-20 DIAGNOSIS — E871 Hypo-osmolality and hyponatremia: Secondary | ICD-10-CM | POA: Diagnosis present

## 2022-07-20 DIAGNOSIS — Z7983 Long term (current) use of bisphosphonates: Secondary | ICD-10-CM

## 2022-07-20 DIAGNOSIS — Z7901 Long term (current) use of anticoagulants: Secondary | ICD-10-CM

## 2022-07-20 DIAGNOSIS — I480 Paroxysmal atrial fibrillation: Secondary | ICD-10-CM | POA: Diagnosis present

## 2022-07-20 DIAGNOSIS — Z87440 Personal history of urinary (tract) infections: Secondary | ICD-10-CM

## 2022-07-20 DIAGNOSIS — D631 Anemia in chronic kidney disease: Secondary | ICD-10-CM | POA: Diagnosis present

## 2022-07-20 DIAGNOSIS — Z87891 Personal history of nicotine dependence: Secondary | ICD-10-CM

## 2022-07-20 DIAGNOSIS — Z79899 Other long term (current) drug therapy: Secondary | ICD-10-CM

## 2022-07-20 DIAGNOSIS — Z794 Long term (current) use of insulin: Secondary | ICD-10-CM

## 2022-07-20 DIAGNOSIS — Z7409 Other reduced mobility: Secondary | ICD-10-CM | POA: Diagnosis present

## 2022-07-20 DIAGNOSIS — N138 Other obstructive and reflux uropathy: Secondary | ICD-10-CM | POA: Diagnosis present

## 2022-07-20 DIAGNOSIS — R569 Unspecified convulsions: Secondary | ICD-10-CM | POA: Diagnosis present

## 2022-07-20 DIAGNOSIS — N179 Acute kidney failure, unspecified: Principal | ICD-10-CM | POA: Diagnosis present

## 2022-07-20 DIAGNOSIS — M810 Age-related osteoporosis without current pathological fracture: Secondary | ICD-10-CM | POA: Diagnosis present

## 2022-07-20 DIAGNOSIS — I5022 Chronic systolic (congestive) heart failure: Secondary | ICD-10-CM | POA: Diagnosis present

## 2022-07-20 DIAGNOSIS — R55 Syncope and collapse: Secondary | ICD-10-CM | POA: Diagnosis not present

## 2022-07-20 DIAGNOSIS — I2510 Atherosclerotic heart disease of native coronary artery without angina pectoris: Secondary | ICD-10-CM | POA: Diagnosis present

## 2022-07-20 DIAGNOSIS — E88819 Insulin resistance, unspecified: Secondary | ICD-10-CM | POA: Diagnosis present

## 2022-07-20 DIAGNOSIS — R8281 Pyuria: Secondary | ICD-10-CM | POA: Diagnosis present

## 2022-07-20 DIAGNOSIS — Z8673 Personal history of transient ischemic attack (TIA), and cerebral infarction without residual deficits: Secondary | ICD-10-CM

## 2022-07-20 DIAGNOSIS — N1339 Other hydronephrosis: Secondary | ICD-10-CM | POA: Diagnosis present

## 2022-07-20 DIAGNOSIS — I130 Hypertensive heart and chronic kidney disease with heart failure and stage 1 through stage 4 chronic kidney disease, or unspecified chronic kidney disease: Secondary | ICD-10-CM | POA: Diagnosis present

## 2022-07-20 DIAGNOSIS — E785 Hyperlipidemia, unspecified: Secondary | ICD-10-CM | POA: Diagnosis present

## 2022-07-20 DIAGNOSIS — R000 Tachycardia, unspecified: Secondary | ICD-10-CM | POA: Diagnosis present

## 2022-07-20 DIAGNOSIS — N184 Chronic kidney disease, stage 4 (severe): Secondary | ICD-10-CM | POA: Diagnosis present

## 2022-07-20 DIAGNOSIS — R531 Weakness: Secondary | ICD-10-CM | POA: Diagnosis present

## 2022-07-20 DIAGNOSIS — M5030 Other cervical disc degeneration, unspecified cervical region: Secondary | ICD-10-CM | POA: Diagnosis present

## 2022-07-20 DIAGNOSIS — N189 Chronic kidney disease, unspecified: Secondary | ICD-10-CM | POA: Diagnosis present

## 2022-07-20 DIAGNOSIS — R8271 Bacteriuria: Secondary | ICD-10-CM | POA: Diagnosis present

## 2022-07-20 DIAGNOSIS — E11649 Type 2 diabetes mellitus with hypoglycemia without coma: Secondary | ICD-10-CM | POA: Diagnosis not present

## 2022-07-20 DIAGNOSIS — Z1152 Encounter for screening for COVID-19: Secondary | ICD-10-CM

## 2022-07-20 DIAGNOSIS — M25551 Pain in right hip: Secondary | ICD-10-CM | POA: Diagnosis present

## 2022-07-20 DIAGNOSIS — K219 Gastro-esophageal reflux disease without esophagitis: Secondary | ICD-10-CM | POA: Diagnosis present

## 2022-07-20 DIAGNOSIS — K8020 Calculus of gallbladder without cholecystitis without obstruction: Secondary | ICD-10-CM | POA: Diagnosis present

## 2022-07-20 DIAGNOSIS — Z833 Family history of diabetes mellitus: Secondary | ICD-10-CM

## 2022-07-20 DIAGNOSIS — Z880 Allergy status to penicillin: Secondary | ICD-10-CM

## 2022-07-20 DIAGNOSIS — Z884 Allergy status to anesthetic agent status: Secondary | ICD-10-CM

## 2022-07-20 DIAGNOSIS — E1165 Type 2 diabetes mellitus with hyperglycemia: Secondary | ICD-10-CM | POA: Diagnosis present

## 2022-07-20 LAB — CBC WITH DIFFERENTIAL/PLATELET
Abs Immature Granulocytes: 0.09 10*3/uL — ABNORMAL HIGH (ref 0.00–0.07)
Basophils Absolute: 0 10*3/uL (ref 0.0–0.1)
Basophils Relative: 0 %
Eosinophils Absolute: 0 10*3/uL (ref 0.0–0.5)
Eosinophils Relative: 0 %
HCT: 33.7 % — ABNORMAL LOW (ref 36.0–46.0)
Hemoglobin: 10.9 g/dL — ABNORMAL LOW (ref 12.0–15.0)
Immature Granulocytes: 1 %
Lymphocytes Relative: 7 %
Lymphs Abs: 1 10*3/uL (ref 0.7–4.0)
MCH: 29.4 pg (ref 26.0–34.0)
MCHC: 32.3 g/dL (ref 30.0–36.0)
MCV: 90.8 fL (ref 80.0–100.0)
Monocytes Absolute: 1.2 10*3/uL — ABNORMAL HIGH (ref 0.1–1.0)
Monocytes Relative: 9 %
Neutro Abs: 11.4 10*3/uL — ABNORMAL HIGH (ref 1.7–7.7)
Neutrophils Relative %: 83 %
Platelets: 569 10*3/uL — ABNORMAL HIGH (ref 150–400)
RBC: 3.71 MIL/uL — ABNORMAL LOW (ref 3.87–5.11)
RDW: 13.1 % (ref 11.5–15.5)
WBC: 13.7 10*3/uL — ABNORMAL HIGH (ref 4.0–10.5)
nRBC: 0 % (ref 0.0–0.2)

## 2022-07-20 LAB — URINALYSIS, ROUTINE W REFLEX MICROSCOPIC
Bilirubin Urine: NEGATIVE
Glucose, UA: >=500 mg/dL — AB
Ketones, ur: 5 mg/dL — AB
Nitrite: NEGATIVE
Protein, ur: 30 mg/dL — AB
Specific Gravity, Urine: 1.007 (ref 1.005–1.030)
WBC, UA: >50 WBC/hpf (ref 0–5)
pH: 6 (ref 5.0–8.0)

## 2022-07-20 LAB — COMPREHENSIVE METABOLIC PANEL
ALT: 16 U/L (ref 0–44)
AST: 27 U/L (ref 15–41)
Albumin: 2.3 g/dL — ABNORMAL LOW (ref 3.5–5.0)
Alkaline Phosphatase: 82 U/L (ref 38–126)
Anion gap: 13 (ref 5–15)
BUN: 37 mg/dL — ABNORMAL HIGH (ref 8–23)
CO2: 25 mmol/L (ref 22–32)
Calcium: 8.5 mg/dL — ABNORMAL LOW (ref 8.9–10.3)
Chloride: 93 mmol/L — ABNORMAL LOW (ref 98–111)
Creatinine, Ser: 2.82 mg/dL — ABNORMAL HIGH (ref 0.44–1.00)
GFR, Estimated: 16 mL/min — ABNORMAL LOW (ref >60)
Glucose, Bld: 300 mg/dL — ABNORMAL HIGH (ref 70–99)
Potassium: 4.4 mmol/L (ref 3.5–5.1)
Sodium: 131 mmol/L — ABNORMAL LOW (ref 135–145)
Total Bilirubin: 0.4 mg/dL (ref 0.3–1.2)
Total Protein: 7.3 g/dL (ref 6.5–8.1)

## 2022-07-20 LAB — I-STAT VENOUS BLOOD GAS, ED
Acid-Base Excess: 2 mmol/L (ref 0.0–2.0)
Bicarbonate: 26.5 mmol/L (ref 20.0–28.0)
Calcium, Ion: 1.11 mmol/L — ABNORMAL LOW (ref 1.15–1.40)
HCT: 42 % (ref 36.0–46.0)
Hemoglobin: 14.3 g/dL (ref 12.0–15.0)
O2 Saturation: 51 %
Potassium: 4.5 mmol/L (ref 3.5–5.1)
Sodium: 132 mmol/L — ABNORMAL LOW (ref 135–145)
TCO2: 28 mmol/L (ref 22–32)
pCO2, Ven: 41.7 mmHg — ABNORMAL LOW (ref 44–60)
pH, Ven: 7.411 (ref 7.25–7.43)
pO2, Ven: 27 mmHg — CL (ref 32–45)

## 2022-07-20 LAB — I-STAT CHEM 8, ED
BUN: 37 mg/dL — ABNORMAL HIGH (ref 8–23)
Calcium, Ion: 1.1 mmol/L — ABNORMAL LOW (ref 1.15–1.40)
Chloride: 96 mmol/L — ABNORMAL LOW (ref 98–111)
Creatinine, Ser: 2.9 mg/dL — ABNORMAL HIGH (ref 0.44–1.00)
Glucose, Bld: 295 mg/dL — ABNORMAL HIGH (ref 70–99)
HCT: 40 % (ref 36.0–46.0)
Hemoglobin: 13.6 g/dL (ref 12.0–15.0)
Potassium: 4.5 mmol/L (ref 3.5–5.1)
Sodium: 132 mmol/L — ABNORMAL LOW (ref 135–145)
TCO2: 27 mmol/L (ref 22–32)

## 2022-07-20 LAB — RESP PANEL BY RT-PCR (RSV, FLU A&B, COVID)  RVPGX2
Influenza A by PCR: NEGATIVE
Influenza B by PCR: NEGATIVE
Resp Syncytial Virus by PCR: NEGATIVE
SARS Coronavirus 2 by RT PCR: NEGATIVE

## 2022-07-20 LAB — TYPE AND SCREEN
ABO/RH(D): A NEG
Antibody Screen: NEGATIVE

## 2022-07-20 LAB — CBG MONITORING, ED: Glucose-Capillary: 287 mg/dL — ABNORMAL HIGH (ref 70–99)

## 2022-07-20 LAB — GLUCOSE, CAPILLARY
Glucose-Capillary: 255 mg/dL — ABNORMAL HIGH (ref 70–99)
Glucose-Capillary: 139 mg/dL — ABNORMAL HIGH (ref 70–99)

## 2022-07-20 LAB — PROTIME-INR
INR: 2.6 — ABNORMAL HIGH (ref 0.8–1.2)
Prothrombin Time: 27.8 seconds — ABNORMAL HIGH (ref 11.4–15.2)

## 2022-07-20 LAB — BRAIN NATRIURETIC PEPTIDE: B Natriuretic Peptide: 1142.6 pg/mL — ABNORMAL HIGH (ref 0.0–100.0)

## 2022-07-20 LAB — TROPONIN I (HIGH SENSITIVITY)
Troponin I (High Sensitivity): 14 ng/L (ref <18)
Troponin I (High Sensitivity): 13 ng/L (ref <18)

## 2022-07-20 LAB — LACTIC ACID, PLASMA: Lactic Acid, Venous: 1.6 mmol/L (ref 0.5–1.9)

## 2022-07-20 MED ORDER — PIPERACILLIN-TAZOBACTAM 3.375 G IVPB 30 MIN: 3.3750 g | Freq: Once | INTRAVENOUS | Status: DC

## 2022-07-20 MED ORDER — INSULIN GLARGINE-YFGN 100 UNIT/ML ~~LOC~~ SOLN
14.0000 [IU] | Freq: Every day | SUBCUTANEOUS | Status: DC
  Administered 2022-07-20 – 2022-07-21 (×2): 14 [IU] via SUBCUTANEOUS
  Filled 2022-07-20 (×3): qty 0.14

## 2022-07-20 MED ORDER — CHLORHEXIDINE GLUCONATE CLOTH 2 % EX PADS
6.0000 | MEDICATED_PAD | Freq: Every day | CUTANEOUS | Status: DC
  Administered 2022-07-21 – 2022-07-23 (×3): 6 via TOPICAL

## 2022-07-20 MED ORDER — WARFARIN 1.25 MG HALF TABLET
1.2500 mg | ORAL_TABLET | Freq: Once | ORAL | Status: AC
  Administered 2022-07-20: 1.25 mg via ORAL
  Filled 2022-07-20: qty 1

## 2022-07-20 MED ORDER — ALENDRONATE SODIUM 35 MG PO TABS: 35.0000 mg | ORAL_TABLET | ORAL | Status: DC

## 2022-07-20 MED ORDER — ONDANSETRON HCL 4 MG/2ML IJ SOLN
4.0000 mg | Freq: Once | INTRAMUSCULAR | Status: AC
  Administered 2022-07-20: 4 mg via INTRAVENOUS
  Filled 2022-07-20: qty 2

## 2022-07-20 MED ORDER — WARFARIN - PHARMACIST DOSING INPATIENT: Freq: Every day | Status: DC

## 2022-07-20 MED ORDER — INSULIN ASPART 100 UNIT/ML IJ SOLN
0.0000 [IU] | Freq: Three times a day (TID) | INTRAMUSCULAR | Status: DC
  Administered 2022-07-20: 8 [IU] via SUBCUTANEOUS
  Administered 2022-07-21: 2 [IU] via SUBCUTANEOUS
  Administered 2022-07-21: 3 [IU] via SUBCUTANEOUS
  Administered 2022-07-21: 2 [IU] via SUBCUTANEOUS
  Administered 2022-07-22: 3 [IU] via SUBCUTANEOUS

## 2022-07-20 MED ORDER — ACETAMINOPHEN 325 MG PO TABS: 650.0000 mg | ORAL_TABLET | Freq: Four times a day (QID) | ORAL | Status: DC | PRN

## 2022-07-20 MED ORDER — INSULIN ASPART 100 UNIT/ML IJ SOLN: 0.0000 [IU] | Freq: Every day | INTRAMUSCULAR | Status: DC

## 2022-07-20 MED ORDER — LACTATED RINGERS IV BOLUS (SEPSIS)
500.0000 mL | Freq: Once | INTRAVENOUS | Status: AC
  Administered 2022-07-20: 500 mL via INTRAVENOUS

## 2022-07-20 MED ORDER — METOPROLOL SUCCINATE ER 50 MG PO TB24
50.0000 mg | ORAL_TABLET | Freq: Every day | ORAL | Status: DC
  Administered 2022-07-20 – 2022-07-23 (×4): 50 mg via ORAL
  Filled 2022-07-20 (×4): qty 1

## 2022-07-20 MED ORDER — ACETAMINOPHEN 325 MG PO TABS: 325.0000 mg | ORAL_TABLET | Freq: Every day | ORAL | Status: DC

## 2022-07-20 MED ORDER — ONDANSETRON HCL 4 MG/2ML IJ SOLN: 4.0000 mg | Freq: Four times a day (QID) | INTRAMUSCULAR | Status: DC | PRN

## 2022-07-20 MED ORDER — HYDRALAZINE HCL 20 MG/ML IJ SOLN: 5.0000 mg | Freq: Four times a day (QID) | INTRAMUSCULAR | Status: DC | PRN

## 2022-07-20 MED ORDER — INSULIN ASPART 100 UNIT/ML IJ SOLN: 0.0000 [IU] | Freq: Three times a day (TID) | INTRAMUSCULAR | Status: DC

## 2022-07-20 MED ORDER — SODIUM CHLORIDE 0.9 % IV SOLN: INTRAVENOUS | Status: DC

## 2022-07-20 MED ORDER — LINAGLIPTIN 5 MG PO TABS
5.0000 mg | ORAL_TABLET | Freq: Every day | ORAL | Status: DC
  Administered 2022-07-20 – 2022-07-21 (×2): 5 mg via ORAL
  Filled 2022-07-20 (×3): qty 1

## 2022-07-20 MED ORDER — EZETIMIBE 10 MG PO TABS
10.0000 mg | ORAL_TABLET | Freq: Every day | ORAL | Status: DC
  Administered 2022-07-20 – 2022-07-23 (×4): 10 mg via ORAL
  Filled 2022-07-20 (×4): qty 1

## 2022-07-20 NOTE — H&P (Addendum)
History and Physical    [name] ZOX:[id number] DOB: 03/08/1940 DOA: 07/20/2022  PCP: [name], MD (Confirm with patient/family/NH records and if not entered, this has to be entered at [facility] point of entry) Patient coming from: Home  I have personally briefly reviewed patient's old medical records in [facility]  Chief Complaint: Feeling weak  HPI: [name] [name] is a 82 y.o. female with medical history significant of recent ESBL UTI, IDDM with insulin resistance, CKD stage IV, ischemic cardiomyopathy, PAF on Coumadin, brought in by family member for evaluation of generalized weakness and poor intake nauseous vomiting.  Patient was hospitalized for coagulopathy with supratherapeutic INR and acute bladder outlet obstruction by blood clot and cystic hemorrhage complicated with ESBL UTI of Klebsiella and E. coli on last admission about 2 weeks ago, patient was given IV antibiotic and switch to total 10 days course of Bactrim once daily.  She was also discharged on Foley catheter after inpatient bladder irrigation and plan was for her to go back to urology tomorrow for follow-up.  In addition, patient was discharged home about 10 days ago.  Initially patient has been doing well on 2 about 2 days ago patient started to feel generalized weakness malaise and anorexia nausea but no vomiting.  Has had significant decrease of oral intake for last 2 days.  No fever chills no cough no chest pains.  Denies any urinary symptoms no fever or chills no back pain.  And she reported normal urine output.   ED Course: Borderline tachycardia afebrile, blood pressure borderline low 120/70, CT abdomen pelvis showed again mild hydro and ureteral nephrosis on left side but no significant obstruction bladder is decompressed with Foley in place 8 mm stone noticed in the urinary bladder.  Preoperative showed AKI creatinine 2.8 compared to 1.4 on discharge 2 weeks ago, INR 2.6 WBC 13.7 COVID positive glucose  300.  Review of Systems: As per HPI otherwise 14 point review of systems negative.    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   HYPERLIPIDEMIA 03/27/2006   HYPERTENSION 03/27/2006   Intertrochanteric fracture of right hip (HCC) 07/13/2012   LUNG NODULE 09/01/2006   Excision, Bx Benighn   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"   Osteopenia 2004   Dexa 2004 showed Osteopenia, DEXA 03/2007 normal   Osteopenia    Other chronic cystitis with hematuria    Pelvic fracture (HCC) 06/16/2016   S/P fall   Recurrent urinary tract infection    Seeing Urology   RETINOPATHY, BACKGROUND NOS 03/27/2006   Seizures (HCC)    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    Past Surgical History:  Procedure Laterality Date   ANKLE FRACTURE SURGERY Bilateral 08/2015   "right one was in 2 places"   CARDIAC CATHETERIZATION N/A 05/17/2015   Procedure: Left Heart Cath and Coronary Angiography;  Surgeon: Corky Crafts, MD;  Location: [facility] INVASIVE CV LAB;  Service: Cardiovascular;  Laterality: N/A;  CARDIAC CATHETERIZATION N/A 05/17/2015   Procedure: Coronary Balloon Angioplasty;  Surgeon: Corky Crafts, MD;  Location: [facility] INVASIVE CV LAB;  Service: Cardiovascular;  Laterality: N/A;   FEMUR IM NAIL Right 07/15/2012   Procedure: INTRAMEDULLARY (IM) NAIL HIP;  Surgeon: Eulas Post, MD;  Location: [facility] OR;  Service: Orthopedics;  Laterality: Right;   FEMUR IM NAIL Left 12/02/2015   Procedure: INTRAMEDULLARY (IM) NAIL FEMORAL;  Surgeon: Eldred Manges, MD;   Location: [facility] OR;  Service: Orthopedics;  Laterality: Left;   FRACTURE SURGERY     INCISION AND DRAINAGE HIP Left 01/03/2016   Procedure: IRRIGATION AND DEBRIDEMENT HIP;  Surgeon: Eldred Manges, MD;  Location: [facility] OR;  Service: Orthopedics;  Laterality: Left;   SHOULDER SURGERY Bilateral    "don't remember which side"     reports that she quit smoking about 44 years ago. Her smoking use included cigarettes. She has never used smokeless tobacco. She reports that she does not drink alcohol and does not use drugs.  Allergies  Allergen Reactions   Procaine Hcl Anaphylaxis   Amoxicillin Itching    Family History  Problem Relation Age of Onset   Diabetes Father    Heart attack Father 58   Diabetes Sister    Drug abuse Son    Cancer Neg Hx        no hx of colon or breast cancer     Prior to Admission medications   Medication Sig Start Date End Date Taking? Authorizing Provider  acetaminophen (TYLENOL) 325 MG tablet Take 325 mg by mouth daily at 6 (six) AM.   Yes [provider]  alendronate (FOSAMAX) 35 MG tablet Take 35 mg by mouth once a week. 07/10/22  Yes [provider]  Apoaequorin (PREVAGEN PO) Take 1 tablet by mouth daily.   Yes [provider]  diphenhydramine-acetaminophen (TYLENOL PM) 25-500 MG TABS tablet Take 1 tablet by mouth at bedtime as needed (Prn as needed.).   Yes [provider]  ezetimibe (ZETIA) 10 MG tablet Take 1 tablet (10 mg total) by mouth daily. 12/04/21  Yes Chilton Si, MD  furosemide (LASIX) 40 MG tablet TAKE 1 TABLET BY MOUTH TWICE DAILY AS NEEDED FOR FLUID Patient taking differently: Take 40 mg by mouth 2 (two) times daily. 10/01/21  Yes [name], Nolon Rod, MD  insulin NPH-regular Human (NOVOLIN 70/30 RELION) (70-30) 100 UNIT/ML injection 26 units with breakfast and 2 units with the evening meal. Patient taking differently: Inject 2-26 Units into the skin See admin instructions. Inject 26 units into the skin with breakfast and  2 units with evening meal. 03/14/21  Yes Romero Belling, MD  metoprolol succinate (TOPROL-XL) 50 MG 24 hr tablet TAKE 1 TABLET BY MOUTH ONCE DAILY . APPOINTMENT REQUIRED FOR FUTURE REFILLS Patient taking differently: Take 50 mg by mouth daily. 06/15/22  Yes Chilton Si, MD  Multiple Vitamins-Minerals (MULTIVITAMIN ADULT PO) Take 1 tablet by mouth daily with breakfast.    Yes [provider]  sulfamethoxazole-trimethoprim (BACTRIM DS) 800-160 MG tablet Take 1 tablet by mouth daily for 10 doses. 07/10/22 07/20/22 Yes Ghimire, Lyndel Safe, MD  VITAMIN D, CHOLECALCIFEROL, PO Take 1 tablet by mouth daily.   Yes [provider]  warfarin (COUMADIN) 2.5 MG tablet Take 0.5 tablets (1.25 mg total) by mouth daily. 07/10/22  Yes Dorcas Carrow, MD  atorvastatin (LIPITOR) 80 MG tablet Take 1 tablet by mouth once daily Patient not taking: Reported on 07/20/2022 10/01/21   [name], MD  Physical Exam: Vitals:   07/20/22 1130 07/20/22 1200 07/20/22 1330 07/20/22 1440  BP: 121/67 126/61 (!) 132/57   Pulse: (!) 104 79 79   Resp: (!) 24 17 17    Temp:    97.9 F (36.6 C)  TempSrc:    Oral  SpO2: 94% 98% 100%     Constitutional: NAD, calm, comfortable Vitals:   07/20/22 1130 07/20/22 1200 07/20/22 1330 07/20/22 1440  BP: 121/67 126/61 (!) 132/57   Pulse: (!) 104 79 79   Resp: (!) 24 17 17    Temp:    97.9 F (36.6 C)  TempSrc:    Oral  SpO2: 94% 98% 100%    Eyes: PERRL, lids and conjunctivae normal ENMT: Mucous membranes are dry. Posterior pharynx clear of any exudate or lesions.Normal dentition.  Neck: normal, supple, no masses, no thyromegaly Respiratory: clear to auscultation bilaterally, no wheezing, no crackles. Normal respiratory effort. No accessory muscle use.  Cardiovascular: Regular rate and rhythm, no murmurs / rubs / gallops. No extremity edema. 2+ pedal pulses. No carotid bruits.  Abdomen: no tenderness, no masses palpated. No hepatosplenomegaly. Bowel sounds positive.   Musculoskeletal: no clubbing / cyanosis. No joint deformity upper and lower extremities. Good ROM, no contractures. Normal muscle tone.  Skin: no rashes, lesions, ulcers. No induration Neurologic: CN 2-12 grossly intact. Sensation intact, DTR normal. Strength 5/5 in all 4.  Psychiatric: Normal judgment and insight. Alert and oriented x 3. Normal mood.     Labs on Admission: I have personally reviewed following labs and imaging studies  CBC: Recent Labs  Lab 07/20/22 1011 07/20/22 1100 07/20/22 1101  WBC 13.7*  --   --   NEUTROABS 11.4*  --   --   HGB 10.9* 13.6 14.3  HCT 33.7* 40.0 42.0  MCV 90.8  --   --   PLT 569*  --   --    Basic Metabolic Panel: Recent Labs  Lab 07/20/22 1011 07/20/22 1100 07/20/22 1101  NA 131* 132* 132*  K 4.4 4.5 4.5  CL 93* 96*  --   CO2 25  --   --   GLUCOSE 300* 295*  --   BUN 37* 37*  --   CREATININE 2.82* 2.90*  --   CALCIUM 8.5*  --   --    GFR: Estimated Creatinine Clearance: 17 mL/min (A) (by C-G formula based on SCr of 2.9 mg/dL (H)). Liver Function Tests: Recent Labs  Lab 07/20/22 1011  AST 27  ALT 16  ALKPHOS 82  BILITOT 0.4  PROT 7.3  ALBUMIN 2.3*   No results for input(s): "LIPASE", "AMYLASE" in the last 168 hours. No results for input(s): "AMMONIA" in the last 168 hours. Coagulation Profile: Recent Labs  Lab 07/20/22 1011  INR 2.6*   Cardiac Enzymes: No results for input(s): "CKTOTAL", "CKMB", "CKMBINDEX", "TROPONINI" in the last 168 hours. BNP (last 3 results) No results for input(s): "PROBNP" in the last 8760 hours. HbA1C: No results for input(s): "HGBA1C" in the last 72 hours. CBG: Recent Labs  Lab 07/20/22 1031  GLUCAP 287*   Lipid Profile: No results for input(s): "CHOL", "HDL", "LDLCALC", "TRIG", "CHOLHDL", "LDLDIRECT" in the last 72 hours. Thyroid Function Tests: No results for input(s): "TSH", "T4TOTAL", "FREET4", "T3FREE", "THYROIDAB" in the last 72 hours. Anemia Panel: No results for  input(s): "VITAMINB12", "FOLATE", "FERRITIN", "TIBC", "IRON", "RETICCTPCT" in the last 72 hours. Urine analysis:    Component Value Date/Time   COLORURINE YELLOW 07/20/2022 1310   APPEARANCEUR HAZY (A)  07/20/2022 1310   LABSPEC 1.007 07/20/2022 1310   PHURINE 6.0 07/20/2022 1310   GLUCOSEU >=500 (A) 07/20/2022 1310   GLUCOSEU NEGATIVE 05/06/2022 1103   HGBUR MODERATE (A) 07/20/2022 1310   BILIRUBINUR NEGATIVE 07/20/2022 1310   BILIRUBINUR neg 05/16/2015 1928   KETONESUR 5 (A) 07/20/2022 1310   PROTEINUR 30 (A) 07/20/2022 1310   UROBILINOGEN 0.2 05/06/2022 1103   NITRITE NEGATIVE 07/20/2022 1310   LEUKOCYTESUR LARGE (A) 07/20/2022 1310    Radiological Exams on Admission: DG Femur Min 2 Views Right  Result Date: 07/20/2022 CLINICAL DATA:  Pain after fall EXAM: RIGHT FEMUR 2 VIEWS COMPARISON:  07/15/2018 FINDINGS: Dynamic hip screw in place with long intramedullary rod. Single distal metaphyseal fixation screw along the rod. Underlying osteopenia. Healed fracture of the very proximal femur. Additional areas of injury along the right pubic bone are also stable. Degenerative changes of the knee joint and hip joint. Scattered vascular calcifications. IMPRESSION: Intramedullary rod with dynamic hip screw in place. Healed injury along the proximal femur and adjacent pelvis. Mild degenerative changes.  Scattered vascular calcifications. Electronically Signed   By: Karen Kays M.D.   On: 07/20/2022 13:18   CT CHEST ABDOMEN PELVIS WO CONTRAST  Result Date: 07/20/2022 CLINICAL DATA:  Fall. EXAM: CT CHEST, ABDOMEN AND PELVIS WITHOUT CONTRAST TECHNIQUE: Multidetector CT imaging of the chest, abdomen and pelvis was performed following the standard protocol without IV contrast. RADIATION DOSE REDUCTION: This exam was performed according to the departmental dose-optimization program which includes automated exposure control, adjustment of the mA and/or kV according to patient size and/or use of iterative  reconstruction technique. COMPARISON:  Jul 07, 2022. FINDINGS: CT CHEST FINDINGS Cardiovascular: Atherosclerosis of thoracic aorta is noted without aneurysm or dissection. Normal cardiac size. No pericardial effusion. Coronary artery calcifications are noted. Mediastinum/Nodes: Small sliding-type hiatal hernia. No adenopathy. Thyroid gland is unremarkable. Lungs/Pleura: No pneumothorax or pleural effusion is noted. Minimal right middle lobe and lingular subsegmental atelectasis or scarring is noted. 4 mm right middle lobe nodule is noted on image 106 of series 5. Musculoskeletal: No chest wall mass or suspicious bone lesions identified. CT ABDOMEN PELVIS FINDINGS Hepatobiliary: Cholelithiasis. No biliary dilatation. Liver is unremarkable on these unenhanced images. Pancreas: Stable cystic lesion seen in pancreatic tail, largest measuring 13 mm. No acute inflammation or ductal dilatation is noted. Spleen: Normal in size without focal abnormality. Adrenals/Urinary Tract: Adrenal glands are unremarkable. Stable right renal cyst is noted. Right renal cortical scarring is again noted. Stable left renal cyst. Small nonobstructive left renal calculus is noted. Mild left hydroureteronephrosis is noted without evidence of obstructing calculus. Urinary bladder is decompressed secondary to Foley catheter. 8 mm calculus is noted in urinary bladder. Stomach/Bowel: Stomach is within normal limits. Appendix appears normal. No evidence of bowel wall thickening, distention, or inflammatory changes. Vascular/Lymphatic: Aortic atherosclerosis. No enlarged abdominal or pelvic lymph nodes. Reproductive: Uterus and bilateral adnexa are unremarkable. Other: No abdominal wall hernia or abnormality. No abdominopelvic ascites. Musculoskeletal: Status post surgical repair of both proximal femurs. No acute osseous abnormality is noted. Old L1 compression fracture is noted. IMPRESSION: No acute traumatic injury seen in the chest, abdomen or  pelvis. 4 mm right middle lobe nodule. No follow-up needed if patient is low-risk.This recommendation follows the consensus statement: Guidelines for Management of Incidental Pulmonary Nodules Detected on CT Images: From the Fleischner Society 2017; Radiology 2017; 284:228-243. Small nonobstructive left renal calculus. Mild left hydroureteronephrosis is noted without evidence of obstructing calculus. Urinary bladder is decompressed secondary  to Foley catheter. 8 mm calculus noted in urinary bladder. Cholelithiasis without inflammation. Stable cystic lesion seen in pancreatic tail, largest measuring 13 mm. Old L1 compression fracture as well surgical internal fixation of old proximal femoral fractures. Aortic Atherosclerosis (ICD10-I70.0). Electronically Signed   By: Lupita Raider M.D.   On: 07/20/2022 11:10   CT HEAD WO CONTRAST  Result Date: 07/20/2022 CLINICAL DATA:  Status post fall. EXAM: CT HEAD WITHOUT CONTRAST CT CERVICAL SPINE WITHOUT CONTRAST TECHNIQUE: Multidetector CT imaging of the head and cervical spine was performed following the standard protocol without intravenous contrast. Multiplanar CT image reconstructions of the cervical spine were also generated. RADIATION DOSE REDUCTION: This exam was performed according to the departmental dose-optimization program which includes automated exposure control, adjustment of the mA and/or kV according to patient size and/or use of iterative reconstruction technique. COMPARISON:  None Available. FINDINGS: CT HEAD FINDINGS Brain: No evidence of acute infarction, hemorrhage, hydrocephalus, extra-axial collection or mass lesion/mass effect. Previous left MCA infarct is again noted predominantly involving the left temporal lobe. There is mild low-attenuation within the subcortical and periventricular white matter compatible with chronic microvascular disease. Vascular: No hyperdense vessel or unexpected calcification. Skull: Normal. Negative for fracture or  focal lesion. Sinuses/Orbits: No acute finding. Other: None. CT CERVICAL SPINE FINDINGS Alignment: Normal. Skull base and vertebrae: No acute fracture. No primary bone lesion or focal pathologic process. Soft tissues and spinal canal: No prevertebral fluid or swelling. No visible canal hematoma. Disc levels: There is multilevel disc space narrowing and endplate spurring within the cervical spine. Solid fusion of the C6-7 vertebra noted. Upper chest: Negative. Other: None IMPRESSION: 1. No acute intracranial abnormality. 2. Chronic left MCA infarct and small vessel ischemic change. 3. No evidence for cervical spine fracture or subluxation. 4. Cervical degenerative disc disease. Electronically Signed   By: Signa Kell M.D.   On: 07/20/2022 11:09   CT CERVICAL SPINE WO CONTRAST  Result Date: 07/20/2022 CLINICAL DATA:  Status post fall. EXAM: CT HEAD WITHOUT CONTRAST CT CERVICAL SPINE WITHOUT CONTRAST TECHNIQUE: Multidetector CT imaging of the head and cervical spine was performed following the standard protocol without intravenous contrast. Multiplanar CT image reconstructions of the cervical spine were also generated. RADIATION DOSE REDUCTION: This exam was performed according to the departmental dose-optimization program which includes automated exposure control, adjustment of the mA and/or kV according to patient size and/or use of iterative reconstruction technique. COMPARISON:  None Available. FINDINGS: CT HEAD FINDINGS Brain: No evidence of acute infarction, hemorrhage, hydrocephalus, extra-axial collection or mass lesion/mass effect. Previous left MCA infarct is again noted predominantly involving the left temporal lobe. There is mild low-attenuation within the subcortical and periventricular white matter compatible with chronic microvascular disease. Vascular: No hyperdense vessel or unexpected calcification. Skull: Normal. Negative for fracture or focal lesion. Sinuses/Orbits: No acute finding. Other:  None. CT CERVICAL SPINE FINDINGS Alignment: Normal. Skull base and vertebrae: No acute fracture. No primary bone lesion or focal pathologic process. Soft tissues and spinal canal: No prevertebral fluid or swelling. No visible canal hematoma. Disc levels: There is multilevel disc space narrowing and endplate spurring within the cervical spine. Solid fusion of the C6-7 vertebra noted. Upper chest: Negative. Other: None IMPRESSION: 1. No acute intracranial abnormality. 2. Chronic left MCA infarct and small vessel ischemic change. 3. No evidence for cervical spine fracture or subluxation. 4. Cervical degenerative disc disease. Electronically Signed   By: Signa Kell M.D.   On: 07/20/2022 11:09   DG Chest [facility]  1 View  Result Date: 07/20/2022 CLINICAL DATA:  Sepsis EXAM: PORTABLE CHEST 1 VIEW COMPARISON:  CXR 06/29/22 FINDINGS: No pleural effusion. No pneumothorax. Normal cardiac and mediastinal contours. Bibasilar atelectasis. No radiographically apparent displaced rib fractures. Visualized upper abdomen is unremarkable. IMPRESSION: Bibasilar atelectasis. Electronically Signed   By: Lorenza Cambridge M.D.   On: 07/20/2022 10:29    EKG: Independently reviewed.  Flutter, rate controlled, no acute ST changes.  Assessment/Plan Principal Problem:   AKI (acute kidney injury) (HCC) Active Problems:   Diabetes (HCC)   Acute kidney injury superimposed on chronic kidney disease (HCC)   PAF (paroxysmal atrial fibrillation) (HCC)  (please populate well all problems here in Problem List. (For example, if patient is on BP meds at home and you resume or decide to hold them, it is a problem that needs to be her. Same for CAD, COPD, HLD and so on)  AKI on CKD stage IV -Clinically patient has symptoms signs of volume contraction from poor oral intake for the last 2 days, likely probably related to COVID infection -Agreed with IV fluid x 12 hours -CT abdomen pelvis showed mild left hydroureteronephrosis and 8 mm bladder  stone nonobstructive.  Less likely to contribute patient kidney function surge. -Other DDx, Bactrim induced nephropathy less likely but will monitor the progress of recovery of kidney function, last dose of Bactrim was 2 days ago, no indication for bicarb drip.  History of recent acute obstructive uropathy secondary to blood clot from supratherapeutic INR and hemorrhagic cystitis -Foley was changed in the ED -Patient is scheduled to see urology tomorrow in clinic.  Discussed with on-call Dr. Urology Dr. Annabell Howells, who recommend if patient condition remained stable and worsening kidney function likely can perform void trial and possible DC continue Foley tomorrow.  Urology recommend patient reschedule follow-up  Pyuria -Denies any abdominal pain, back pain or fever.  And patient just completed full course of IV imipenem and a total of 10 days course of Bactrim treatment -Discussed with on-call infectious disease attending Dr. Earlene Plater, agreed with holding off antibiotics for now -Repeat CBC tomorrow  Leukocytosis -As above.  IDDM with insulin resistance, with hyperglycemia -SSI -Hold off long-acting insulin given the kidney functions are -Start Januvia  HTN -With AKI and volume contractio, will hold off Lasix -Continue beta-blocker -As needed hydralazine  PAF -Rate controlled, continue metoprolol -INR therapeutic, consult pharmacy for Coumadin dosing  Deconditioning -Also complaining about right hip pain, CT abdominal pelvis showed no acute findings. -PT evaluation  DVT prophylaxis: INR=2.6 Code Status: Full code Family Communication: None at bedside Disposition Plan: Expect less than 2 midnight hospital stay Consults called: Curbside consult with urology and infectious disease, reconsult if further needed Admission status: MedSurg observation   Emeline General MD [facility] Pager [phone]  07/20/2022, 3:09 PM

## 2022-07-20 NOTE — Progress Notes (Signed)
New Admission Note:   Arrival Method:  Stretcher  Mental Orientation: Alert and oriented  Telemetry: Assessment: Completed Skin: bruising on leg  IV: Right AC and Left forearm  Pain: Tubes: none  Safety Measures: Safety Fall Prevention Plan has been given, discussed and signed Admission: Completed [facility] Orientation: Patient has been orientated to the room, unit and staff.  Family: none  Orders have been reviewed and implemented. Will continue to monitor the patient. Call light has been placed within reach and bed alarm has been activated.  Norman Clay [facility] Phone: [phone]

## 2022-07-20 NOTE — Telephone Encounter (Signed)
Received call from Jonny Ruiz- Pt's husband, he states Pt is very groggy, throwing up, and having chest pain. Informed [name] I think he should call 911 for them to evaluate and bring her to the ER. [name] denied 911 call but stated he would bring her to ER downstairs for evaluation.

## 2022-07-20 NOTE — Telephone Encounter (Signed)
Patient had a new patient appt with Dr Margo Aye tomorrow morning that patient's husband called to cancel, she is [name] [name] ER that she took a fall. Patient's husband stated Dr Margo Aye has seen patient before in the ER and stated if Dr Margo Aye wanted to see patient at [facility], as he stated Dr Margo Aye really wanted to see patient, he could come there. I let patients husband know Dr Margo Aye is not in the office today.    Patients husbands #: [phone]

## 2022-07-20 NOTE — Telephone Encounter (Signed)
Noted, thank you

## 2022-07-20 NOTE — ED Triage Notes (Addendum)
Unwell for 48 hours. Called PCP, they wanted to send [place]. On the way to the car with walker, patient fell into grass. Initially patient was assisted back to chair, but had approximately a 20 second syncopal episode. Upon waking up patient was alert and oriented, but continues to feel unwell. Pain on palpation of pelvis and upper legs. Patient also reports headache, chest pain, back pain. Patient also reports nausea  BG 213

## 2022-07-20 NOTE — Progress Notes (Signed)
ANTICOAGULATION CONSULT NOTE - Initial Consult  Pharmacy Consult for warfarin Indication: atrial fibrillation  Allergies  Allergen Reactions   Procaine Hcl Anaphylaxis   Amoxicillin Itching    Patient Measurements:    Vital Signs: Temp: 97.9 F (36.6 C) (05/20 1440) Temp Source: Oral (05/20 1440) BP: 132/57 (05/20 1330) Pulse Rate: 79 (05/20 1330)  Labs: Recent Labs    07/20/22 1011 07/20/22 1100 07/20/22 1101 07/20/22 1310  HGB 10.9* 13.6 14.3  --   HCT 33.7* 40.0 42.0  --   PLT 569*  --   --   --   LABPROT 27.8*  --   --   --   INR 2.6*  --   --   --   CREATININE 2.82* 2.90*  --   --   TROPONINIHS 14  --   --  13    Estimated Creatinine Clearance: 17 mL/min (A) (by C-G formula based on SCr of 2.9 mg/dL (H)).   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 Her last dose was 5/19.    Assessment: Patient presents to the ED after a fall. She has been weak lately with poor intake and nausea and vomiting. She was recently found to have a UTI and was on bactrim. UTI cultures now indicate ESBL. INR today 2.6.   Goal of Therapy:  INR 2-3 Monitor platelets by anticoagulation protocol: Yes   Plan:  Give Warfarin 1.25 x 1 tonight  Follow up CBC and daily INR   Blane Ohara, PharmD  PGY1 Pharmacy Resident

## 2022-07-20 NOTE — Plan of Care (Signed)
  Problem: Education: Goal: Ability to describe self-care measures that may prevent or decrease complications (Diabetes Survival Skills Education) will improve Outcome: Progressing   

## 2022-07-20 NOTE — Telephone Encounter (Signed)
Pt's husband wanted Dr. Drue Novel to know that pt is at [facility]

## 2022-07-20 NOTE — Inpatient Diabetes Management (Signed)
Inpatient Diabetes Program Recommendations  AACE/ADA: New Consensus Statement on Inpatient Glycemic Control (2015)  Target Ranges:  Prepandial:   less than 140 mg/dL      Peak postprandial:   less than 180 mg/dL (1-2 hours)      Critically ill patients:  140 - 180 mg/dL   Lab Results  Component Value Date   GLUCAP 287 (H) 07/20/2022   HGBA1C 9.0 (H) 05/05/2022    Review of Glycemic Control  Latest Reference Range & Units 07/20/22 10:31  Glucose-Capillary 70 - 99 mg/dL 161 (H)  (H): Data is abnormally high Diabetes history: Type 2 DM Outpatient Diabetes medications: Novolog 70/30- 26 units QA/2 units QPM Current orders for Inpatient glycemic control: none  Inpatient Diabetes Program Recommendations:    Consider adding Semglee 14 units QD and Novolog 0-6 units TID & HS  Thanks, Lujean Rave, MSN, RNC-OB Diabetes Coordinator [phone] (8a-5p)

## 2022-07-20 NOTE — ED Notes (Signed)
ED TO INPATIENT HANDOFF REPORT  ED Nurse Name and Phone #: Brett Canales [phone]  S Name/Age/Gender [name] [name] 82 y.o. female Room/Bed: 030C/030C  Code Status   Code Status: Full Code  Home/SNF/Other Home Patient oriented to: self, place, time, and situation Is this baseline? Yes   Triage Complete: Triage complete  Chief Complaint AKI (acute kidney injury) (HCC) [N17.9]  Triage Note Unwell for 48 hours. Called PCP, [place]. On the way to the car with walker, patient fell into grass. Initially patient was assisted back to chair, but had approximately a 20 second syncopal episode. Upon waking up patient was alert and oriented, but continues to feel unwell. Pain on palpation of pelvis and upper legs. Patient also reports headache, chest pain, back pain. Patient also reports nausea  BG 213   Allergies Allergies  Allergen Reactions   Procaine Hcl Anaphylaxis   Amoxicillin Itching    Level of Care/Admitting Diagnosis ED Disposition     ED Disposition  Admit   Condition  --   Comment  Hospital Area: [facility] [[id number]]  Level of Care: Med-Surg [16]  May place patient in observation at [facility] or [facility] if equivalent level of care is available:: No  Covid Evaluation: Asymptomatic - no recent exposure (last 10 days) testing not required  Diagnosis: AKI (acute kidney injury) [facility]) [[id number]]  Admitting Physician: Emeline General [[id number]]  Attending Physician: Emeline General [[id number]]          B Medical/Surgery History Past Medical History:  Diagnosis Date   Atrial fibrillation (HCC)    SVT dx 2007, cath 2007 mild CAD, then had an cardioversion, ablation; still on coumadin , has occ palpitation, EKG 03-2010 NSR   Blindness of left eye    CKD (chronic kidney disease), stage III (HCC) 06/16/2016   [name] 06/17/2016   DIABETES MELLITUS, TYPE II 03/27/2006   dr Everardo All   Eye muscle weakness    Right eye weakness  after cataract surgery   GERD (gastroesophageal reflux disease) 10/05/2011   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   HYPERLIPIDEMIA 03/27/2006   HYPERTENSION 03/27/2006   Intertrochanteric fracture of right hip (HCC) 07/13/2012   LUNG NODULE 09/01/2006   Excision, Bx Benighn   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"   Osteopenia 2004   Dexa 2004 showed Osteopenia, DEXA 03/2007 normal   Osteopenia    Other chronic cystitis with hematuria    Pelvic fracture (HCC) 06/16/2016   S/P fall   Recurrent urinary tract infection    Seeing Urology   RETINOPATHY, BACKGROUND NOS 03/27/2006   Seizures (HCC)    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   Past Surgical History:  Procedure Laterality Date   ANKLE FRACTURE SURGERY Bilateral 08/2015   "right one was in 2 places"   CARDIAC CATHETERIZATION N/A 05/17/2015   Procedure: Left Heart Cath and Coronary Angiography;  Surgeon: Corky Crafts, MD;  Location: [facility] INVASIVE CV LAB;  Service: Cardiovascular;  Laterality: N/A;   CARDIAC CATHETERIZATION N/A 05/17/2015   Procedure: Coronary Balloon Angioplasty;  Surgeon: Corky Crafts, MD;  Location[facility] INVASIVE CV LAB;  Service: Cardiovascular;  Laterality: N/A;   FEMUR IM NAIL Right 07/15/2012   Procedure: INTRAMEDULLARY (IM) NAIL HIP;  Surgeon: Eulas Post, MD;  Location: [facility] OR;  Service: Orthopedics;  Laterality: Right;   FEMUR IM NAIL Left 12/02/2015   Procedure: INTRAMEDULLARY (IM) NAIL FEMORAL;  Surgeon: Eldred Manges,  MD;  Location: [facility] OR;  Service: Orthopedics;  Laterality: Left;   FRACTURE SURGERY     INCISION AND DRAINAGE HIP Left 01/03/2016   Procedure: IRRIGATION AND DEBRIDEMENT HIP;  Surgeon: Eldred Manges, MD;  Location: [facility] OR;  Service: Orthopedics;  Laterality: Left;   SHOULDER SURGERY Bilateral    "don't remember which side"     A IV Location/Drains/Wounds Patient Lines/Drains/Airways Status     Active Line/Drains/Airways     Name Placement date Placement time Site Days   Peripheral IV  07/20/22 20 G Right Antecubital 07/20/22  1041  Antecubital  less than 1   Peripheral IV 07/20/22 20 G Anterior;Distal;Left Forearm 07/20/22  1041  Forearm  less than 1   Urethral Catheter Brighton Pilley, RN Latex 16 Fr. 07/20/22  1120  Latex  less than 1            Intake/Output Last 24 hours No intake or output data in the 24 hours ending 07/20/22 1504  Labs/Imaging Results for orders placed or performed during the hospital encounter of 07/20/22 (from the past 48 hour(s))  CBC WITH DIFFERENTIAL     Status: Abnormal   Collection Time: 07/20/22 10:11 AM  Result Value Ref Range   WBC 13.7 (H) 4.0 - 10.5 K/uL   RBC 3.71 (L) 3.87 - 5.11 MIL/uL   Hemoglobin 10.9 (L) 12.0 - 15.0 g/dL   HCT 16.1 (L) 09.6 - 04.5 %   MCV 90.8 80.0 - 100.0 fL   MCH 29.4 26.0 - 34.0 pg   MCHC 32.3 30.0 - 36.0 g/dL   RDW 40.9 81.1 - 91.4 %   Platelets 569 (H) 150 - 400 K/uL   nRBC 0.0 0.0 - 0.2 %   Neutrophils Relative % 83 %   Neutro Abs 11.4 (H) 1.7 - 7.7 K/uL   Lymphocytes Relative 7 %   Lymphs Abs 1.0 0.7 - 4.0 K/uL   Monocytes Relative 9 %   Monocytes Absolute 1.2 (H) 0.1 - 1.0 K/uL   Eosinophils Relative 0 %   Eosinophils Absolute 0.0 0.0 - 0.5 K/uL   Basophils Relative 0 %   Basophils Absolute 0.0 0.0 - 0.1 K/uL   Immature Granulocytes 1 %   Abs Immature Granulocytes 0.09 (H) 0.00 - 0.07 K/uL    Comment: Performed at [facility], [address] [place], [place] [address]  Comprehensive metabolic panel     Status: Abnormal   Collection Time: 07/20/22 10:11 AM  Result Value Ref Range   Sodium 131 (L) 135 - 145 mmol/L   Potassium 4.4 3.5 - 5.1 mmol/L   Chloride 93 (L) 98 - 111 mmol/L   CO2 25 22 - 32 mmol/L   Glucose, Bld 300 (H) 70 - 99 mg/dL    Comment: Glucose reference range applies only to samples taken after fasting for at least 8 hours.   BUN 37 (H) 8 - 23 mg/dL   Creatinine, Ser 6.21 (H) 0.44 - 1.00 mg/dL   Calcium 8.5 (L) 8.9 - 10.3 mg/dL   Total Protein 7.3 6.5 -  8.1 g/dL   Albumin 2.3 (L) 3.5 - 5.0 g/dL   AST 27 15 - 41 U/L   ALT 16 0 - 44 U/L   Alkaline Phosphatase 82 38 - 126 U/L   Total Bilirubin 0.4 0.3 - 1.2 mg/dL   GFR, Estimated 16 (L) >60 mL/min    Comment: (NOTE) Calculated using the CKD-EPI Creatinine Equation (2021)    Anion gap 13  5 - 15    Comment: Performed at [facility], [address], [place], [place] [address]  Protime-INR     Status: Abnormal   Collection Time: 07/20/22 10:11 AM  Result Value Ref Range   Prothrombin Time 27.8 (H) 11.4 - 15.2 seconds   INR 2.6 (H) 0.8 - 1.2    Comment: (NOTE) INR goal varies based on device and disease states. Performed at [facility], [address] [place], [place] [address]   Troponin I (High Sensitivity)     Status: None   Collection Time: 07/20/22 10:11 AM  Result Value Ref Range   Troponin I (High Sensitivity) 14 <18 ng/L    Comment: (NOTE) Elevated high sensitivity troponin I (hsTnI) values and significant  changes across serial measurements may suggest ACS but many other  chronic and acute conditions are known to elevate hsTnI results.  Refer to the "Links" section for chest pain algorithms and additional  guidance. Performed at [facility], [address], [place], [place] [address]   Lactic acid, plasma     Status: None   Collection Time: 07/20/22 10:13 AM  Result Value Ref Range   Lactic Acid, Venous 1.6 0.5 - 1.9 mmol/L    Comment: Performed at [facility], [address], [place], [place] [address]  Resp panel by RT-PCR (RSV, Flu A&B, Covid) Anterior Nasal Swab     Status: None   Collection Time: 07/20/22 10:14 AM   Specimen: Anterior Nasal Swab  Result Value Ref Range   SARS Coronavirus 2 by RT PCR NEGATIVE NEGATIVE   Influenza A by PCR NEGATIVE NEGATIVE   Influenza B by PCR NEGATIVE NEGATIVE    Comment: (NOTE) The Xpert Xpress SARS-CoV-2/FLU/RSV plus assay is intended as an aid in the diagnosis of influenza from Nasopharyngeal  swab specimens and should not be used as a sole basis for treatment. Nasal washings and aspirates are unacceptable for Xpert Xpress SARS-CoV-2/FLU/RSV testing.  Fact Sheet for Patients: BloggerCourse.com  Fact Sheet for Healthcare Providers: SeriousBroker.it  This test is not yet approved or cleared by the [place] FDA and has been authorized for detection and/or diagnosis of SARS-CoV-2 by FDA under an Emergency Use Authorization (EUA). This EUA will remain in effect (meaning this test can be used) for the duration of the COVID-19 declaration under Section 564(b)(1) of the Act, 21 U.S.C. section 360bbb-3(b)(1), unless the authorization is terminated or revoked.     Resp Syncytial Virus by PCR NEGATIVE NEGATIVE    Comment: (NOTE) Fact Sheet for Patients: BloggerCourse.com  Fact Sheet for Healthcare Providers: SeriousBroker.it  This test is not yet approved or cleared by the [place] FDA and has been authorized for detection and/or diagnosis of SARS-CoV-2 by FDA under an Emergency Use Authorization (EUA). This EUA will remain in effect (meaning this test can be used) for the duration of the COVID-19 declaration under Section 564(b)(1) of the Act, 21 U.S.C. section 360bbb-3(b)(1), unless the authorization is terminated or revoked.  Performed at [facility], [address], [place], [place] [address]   Type and screen [facility]     Status: None   Collection Time: 07/20/22 10:30 AM  Result Value Ref Range   ABO/RH(D) A NEG    Antibody Screen NEG    Sample Expiration      07/23/2022,2359 Performed at [facility], [address], [place], [place] [address]   Brain natriuretic peptide  Status: Abnormal   Collection Time: 07/20/22 10:30 AM  Result Value Ref Range   B Natriuretic Peptide 1,142.6 (H) 0.0 - 100.0 pg/mL    Comment:  Performed at [facility], [address], [place], [place] [address]  CBG monitoring, ED     Status: Abnormal   Collection Time: 07/20/22 10:31 AM  Result Value Ref Range   Glucose-Capillary 287 (H) 70 - 99 mg/dL    Comment: Glucose reference range applies only to samples taken after fasting for at least 8 hours.  I-Stat Chem 8, ED     Status: Abnormal   Collection Time: 07/20/22 11:00 AM  Result Value Ref Range   Sodium 132 (L) 135 - 145 mmol/L   Potassium 4.5 3.5 - 5.1 mmol/L   Chloride 96 (L) 98 - 111 mmol/L   BUN 37 (H) 8 - 23 mg/dL   Creatinine, Ser 1.91 (H) 0.44 - 1.00 mg/dL   Glucose, Bld 478 (H) 70 - 99 mg/dL    Comment: Glucose reference range applies only to samples taken after fasting for at least 8 hours.   Calcium, Ion 1.10 (L) 1.15 - 1.40 mmol/L   TCO2 27 22 - 32 mmol/L   Hemoglobin 13.6 12.0 - 15.0 g/dL   HCT 29.5 62.1 - 30.8 %  I-Stat venous blood gas, ([facility] ED, [facility], [facility])     Status: Abnormal   Collection Time: 07/20/22 11:01 AM  Result Value Ref Range   pH, Ven 7.411 7.25 - 7.43   pCO2, Ven 41.7 (L) 44 - 60 mmHg   pO2, Ven 27 (LL) 32 - 45 mmHg   Bicarbonate 26.5 20.0 - 28.0 mmol/L   TCO2 28 22 - 32 mmol/L   O2 Saturation 51 %   Acid-Base Excess 2.0 0.0 - 2.0 mmol/L   Sodium 132 (L) 135 - 145 mmol/L   Potassium 4.5 3.5 - 5.1 mmol/L   Calcium, Ion 1.11 (L) 1.15 - 1.40 mmol/L   HCT 42.0 36.0 - 46.0 %   Hemoglobin 14.3 12.0 - 15.0 g/dL   Sample type VENOUS    Comment NOTIFIED PHYSICIAN   Urinalysis, Routine w reflex microscopic -Urine, Catheterized; Indwelling urinary catheter     Status: Abnormal   Collection Time: 07/20/22  1:10 PM  Result Value Ref Range   Color, Urine YELLOW YELLOW   APPearance HAZY (A) CLEAR   Specific Gravity, Urine 1.007 1.005 - 1.030   pH 6.0 5.0 - 8.0   Glucose, UA >=500 (A) NEGATIVE mg/dL   Hgb urine dipstick MODERATE (A) NEGATIVE   Bilirubin Urine NEGATIVE NEGATIVE   Ketones, ur 5 (A) NEGATIVE mg/dL   Protein, ur 30 (A)  NEGATIVE mg/dL   Nitrite NEGATIVE NEGATIVE   Leukocytes,Ua LARGE (A) NEGATIVE   RBC / HPF 6-10 0 - 5 RBC/hpf   WBC, UA >50 0 - 5 WBC/hpf   Bacteria, UA MANY (A) NONE SEEN   Squamous Epithelial / HPF 0-5 0 - 5 /HPF   Mucus PRESENT     Comment: Performed at [facility], [address], [place], [place] [address]  Troponin I (High Sensitivity)     Status: None   Collection Time: 07/20/22  1:10 PM  Result Value Ref Range   Troponin I (High Sensitivity) 13 <18 ng/L    Comment: (NOTE) Elevated high sensitivity troponin I (hsTnI) values and significant  changes across serial measurements may suggest ACS but many other  chronic and acute conditions are known to elevate  hsTnI results.  Refer to the "Links" section for chest pain algorithms and additional  guidance. Performed at [facility], [address], [place], [place] [address]    *Note: Due to a large number of results and/or encounters for the requested time period, some results have not been displayed. A complete set of results can be found in Results Review.   DG Femur Min 2 Views Right  Result Date: 07/20/2022 CLINICAL DATA:  Pain after fall EXAM: RIGHT FEMUR 2 VIEWS COMPARISON:  07/15/2018 FINDINGS: Dynamic hip screw in place with long intramedullary rod. Single distal metaphyseal fixation screw along the rod. Underlying osteopenia. Healed fracture of the very proximal femur. Additional areas of injury along the right pubic bone are also stable. Degenerative changes of the knee joint and hip joint. Scattered vascular calcifications. IMPRESSION: Intramedullary rod with dynamic hip screw in place. Healed injury along the proximal femur and adjacent pelvis. Mild degenerative changes.  Scattered vascular calcifications. Electronically Signed   By: Karen Kays M.D.   On: 07/20/2022 13:18   CT CHEST ABDOMEN PELVIS WO CONTRAST  Result Date: 07/20/2022 CLINICAL DATA:  Fall. EXAM: CT CHEST, ABDOMEN AND PELVIS WITHOUT CONTRAST  TECHNIQUE: Multidetector CT imaging of the chest, abdomen and pelvis was performed following the standard protocol without IV contrast. RADIATION DOSE REDUCTION: This exam was performed according to the departmental dose-optimization program which includes automated exposure control, adjustment of the mA and/or kV according to patient size and/or use of iterative reconstruction technique. COMPARISON:  Jul 07, 2022. FINDINGS: CT CHEST FINDINGS Cardiovascular: Atherosclerosis of thoracic aorta is noted without aneurysm or dissection. Normal cardiac size. No pericardial effusion. Coronary artery calcifications are noted. Mediastinum/Nodes: Small sliding-type hiatal hernia. No adenopathy. Thyroid gland is unremarkable. Lungs/Pleura: No pneumothorax or pleural effusion is noted. Minimal right middle lobe and lingular subsegmental atelectasis or scarring is noted. 4 mm right middle lobe nodule is noted on image 106 of series 5. Musculoskeletal: No chest wall mass or suspicious bone lesions identified. CT ABDOMEN PELVIS FINDINGS Hepatobiliary: Cholelithiasis. No biliary dilatation. Liver is unremarkable on these unenhanced images. Pancreas: Stable cystic lesion seen in pancreatic tail, largest measuring 13 mm. No acute inflammation or ductal dilatation is noted. Spleen: Normal in size without focal abnormality. Adrenals/Urinary Tract: Adrenal glands are unremarkable. Stable right renal cyst is noted. Right renal cortical scarring is again noted. Stable left renal cyst. Small nonobstructive left renal calculus is noted. Mild left hydroureteronephrosis is noted without evidence of obstructing calculus. Urinary bladder is decompressed secondary to Foley catheter. 8 mm calculus is noted in urinary bladder. Stomach/Bowel: Stomach is within normal limits. Appendix appears normal. No evidence of bowel wall thickening, distention, or inflammatory changes. Vascular/Lymphatic: Aortic atherosclerosis. No enlarged abdominal or pelvic  lymph nodes. Reproductive: Uterus and bilateral adnexa are unremarkable. Other: No abdominal wall hernia or abnormality. No abdominopelvic ascites. Musculoskeletal: Status post surgical repair of both proximal femurs. No acute osseous abnormality is noted. Old L1 compression fracture is noted. IMPRESSION: No acute traumatic injury seen in the chest, abdomen or pelvis. 4 mm right middle lobe nodule. No follow-up needed if patient is low-risk.This recommendation follows the consensus statement: Guidelines for Management of Incidental Pulmonary Nodules Detected on CT Images: From the Fleischner Society 2017; Radiology 2017; 284:228-243. Small nonobstructive left renal calculus. Mild left hydroureteronephrosis is noted without evidence of obstructing calculus. Urinary bladder is decompressed secondary to Foley catheter. 8 mm calculus noted in urinary bladder. Cholelithiasis without inflammation. Stable cystic lesion seen in pancreatic tail,  largest measuring 13 mm. Old L1 compression fracture as well surgical internal fixation of old proximal femoral fractures. Aortic Atherosclerosis (ICD10-I70.0). Electronically Signed   By: Lupita Raider M.D.   On: 07/20/2022 11:10   CT HEAD WO CONTRAST  Result Date: 07/20/2022 CLINICAL DATA:  Status post fall. EXAM: CT HEAD WITHOUT CONTRAST CT CERVICAL SPINE WITHOUT CONTRAST TECHNIQUE: Multidetector CT imaging of the head and cervical spine was performed following the standard protocol without intravenous contrast. Multiplanar CT image reconstructions of the cervical spine were also generated. RADIATION DOSE REDUCTION: This exam was performed according to the departmental dose-optimization program which includes automated exposure control, adjustment of the mA and/or kV according to patient size and/or use of iterative reconstruction technique. COMPARISON:  None Available. FINDINGS: CT HEAD FINDINGS Brain: No evidence of acute infarction, hemorrhage, hydrocephalus, extra-axial  collection or mass lesion/mass effect. Previous left MCA infarct is again noted predominantly involving the left temporal lobe. There is mild low-attenuation within the subcortical and periventricular white matter compatible with chronic microvascular disease. Vascular: No hyperdense vessel or unexpected calcification. Skull: Normal. Negative for fracture or focal lesion. Sinuses/Orbits: No acute finding. Other: None. CT CERVICAL SPINE FINDINGS Alignment: Normal. Skull base and vertebrae: No acute fracture. No primary bone lesion or focal pathologic process. Soft tissues and spinal canal: No prevertebral fluid or swelling. No visible canal hematoma. Disc levels: There is multilevel disc space narrowing and endplate spurring within the cervical spine. Solid fusion of the C6-7 vertebra noted. Upper chest: Negative. Other: None IMPRESSION: 1. No acute intracranial abnormality. 2. Chronic left MCA infarct and small vessel ischemic change. 3. No evidence for cervical spine fracture or subluxation. 4. Cervical degenerative disc disease. Electronically Signed   By: Signa Kell M.D.   On: 07/20/2022 11:09   CT CERVICAL SPINE WO CONTRAST  Result Date: 07/20/2022 CLINICAL DATA:  Status post fall. EXAM: CT HEAD WITHOUT CONTRAST CT CERVICAL SPINE WITHOUT CONTRAST TECHNIQUE: Multidetector CT imaging of the head and cervical spine was performed following the standard protocol without intravenous contrast. Multiplanar CT image reconstructions of the cervical spine were also generated. RADIATION DOSE REDUCTION: This exam was performed according to the departmental dose-optimization program which includes automated exposure control, adjustment of the mA and/or kV according to patient size and/or use of iterative reconstruction technique. COMPARISON:  None Available. FINDINGS: CT HEAD FINDINGS Brain: No evidence of acute infarction, hemorrhage, hydrocephalus, extra-axial collection or mass lesion/mass effect. Previous left MCA  infarct is again noted predominantly involving the left temporal lobe. There is mild low-attenuation within the subcortical and periventricular white matter compatible with chronic microvascular disease. Vascular: No hyperdense vessel or unexpected calcification. Skull: Normal. Negative for fracture or focal lesion. Sinuses/Orbits: No acute finding. Other: None. CT CERVICAL SPINE FINDINGS Alignment: Normal. Skull base and vertebrae: No acute fracture. No primary bone lesion or focal pathologic process. Soft tissues and spinal canal: No prevertebral fluid or swelling. No visible canal hematoma. Disc levels: There is multilevel disc space narrowing and endplate spurring within the cervical spine. Solid fusion of the C6-7 vertebra noted. Upper chest: Negative. Other: None IMPRESSION: 1. No acute intracranial abnormality. 2. Chronic left MCA infarct and small vessel ischemic change. 3. No evidence for cervical spine fracture or subluxation. 4. Cervical degenerative disc disease. Electronically Signed   By: Signa Kell M.D.   On: 07/20/2022 11:09   DG Chest Port 1 View  Result Date: 07/20/2022 CLINICAL DATA:  Sepsis EXAM: PORTABLE CHEST 1 VIEW COMPARISON:  CXR 06/29/22 FINDINGS:  No pleural effusion. No pneumothorax. Normal cardiac and mediastinal contours. Bibasilar atelectasis. No radiographically apparent displaced rib fractures. Visualized upper abdomen is unremarkable. IMPRESSION: Bibasilar atelectasis. Electronically Signed   By: Lorenza Cambridge M.D.   On: 07/20/2022 10:29    Pending Labs Unresulted Labs (From admission, onward)     Start     Ordered   07/20/22 1013  Blood Culture (routine x 2)  (Undifferentiated presentation (screening labs and basic nursing orders))  BLOOD CULTURE X 2,   STAT      07/20/22 1013   07/20/22 1013  Remove and replace urinary cath (placed > 5 days) then obtain urine culture from new indwelling urinary catheter.  (Undifferentiated presentation (screening labs and basic  nursing orders))  Once,   URGENT       Question:  Indication  Answer:  Flank Pain   07/20/22 1013            Vitals/Pain Today's Vitals   07/20/22 1130 07/20/22 1200 07/20/22 1330 07/20/22 1440  BP: 121/67 126/61 (!) 132/57   Pulse: (!) 104 79 79   Resp: (!) 24 17 17    Temp:    97.9 F (36.6 C)  TempSrc:    Oral  SpO2: 94% 98% 100%   PainSc:        Isolation Precautions No active isolations  Medications Medications  insulin glargine-yfgn (SEMGLEE) injection 14 Units (14 Units Subcutaneous Given 07/20/22 1324)  insulin aspart (novoLOG) injection 0-6 Units (has no administration in time range)  0.9 %  sodium chloride infusion ( Intravenous New Bag/Given 07/20/22 1445)  acetaminophen (TYLENOL) tablet 325 mg (has no administration in time range)  ezetimibe (ZETIA) tablet 10 mg (has no administration in time range)  metoprolol succinate (TOPROL-XL) 24 hr tablet 50 mg (has no administration in time range)  alendronate (FOSAMAX) tablet 35 mg (has no administration in time range)  insulin aspart (novoLOG) injection 0-15 Units (has no administration in time range)  insulin aspart (novoLOG) injection 0-5 Units (has no administration in time range)  hydrALAZINE (APRESOLINE) injection 5 mg (has no administration in time range)  ondansetron (ZOFRAN) injection 4 mg (has no administration in time range)  lactated ringers bolus 500 mL (0 mLs Intravenous Stopped 07/20/22 1313)  ondansetron (ZOFRAN) injection 4 mg (4 mg Intravenous Given 07/20/22 1133)    Mobility walks with device     Focused Assessments Renal Assessment Handoff: Admission for AKI Foley catheter in place   R Recommendations: See Admitting Provider Note  Report given to:   Additional Notes:

## 2022-07-20 NOTE — ED Provider Notes (Addendum)
[place] EMERGENCY DEPARTMENT AT [facility] Provider Note   CSN: [id number] Arrival date & time: 07/20/22  4098     History  Chief Complaint  Patient presents with   Loss of Consciousness    [name] [name] is a 82 y.o. female.   Loss of Consciousness Associated symptoms: chest pain, nausea, shortness of breath, vomiting and weakness (Generalized)   Patient presents for multiple complaints.  Medical history includes HTN, HLD, atrial fibrillation, seizures, GERD, DM, CAD, CKD, CHF, osteoporosis.  She was hospitalized 2 weeks ago.  At that time, she presented to ED with hematuria.  She was found to have UTI and supratherapeutic INR.  She is on warfarin.  She was discharged 10 days ago.  At time of discharge, she was prescribed 10 days of renally dosed Bactrim.  She reports that she has had intermittent chest pain or shortness of breath over the past week.  Yesterday, she had severe generalized weakness, nausea, and vomiting.  She has had poor p.o. intake since yesterday.  She had additional vomiting today.  While sitting in a chair at home, she endorsed chest pain and shortness of breath to her husband.  She was scheduled for an outpatient doctor visit, however, patient came to the ED instead.  As she was being helped out to the car today, she had a syncopal episode upon standing.  She landed on grass.  Duration of LOC was approximately 20 seconds.  She was alert and oriented upon awakening. She currently endorses generalized pain.  She denies any current nausea.       Home Medications Prior to Admission medications   Medication Sig Start Date End Date Taking? Authorizing Provider  acetaminophen (TYLENOL) 325 MG tablet Take 325 mg by mouth daily at 6 (six) AM.    [provider]  atorvastatin (LIPITOR) 80 MG tablet Take 1 tablet by mouth once daily 10/01/21   [name], MD  denosumab (PROLIA) 60 MG/ML SOSY injection Inject 60 mg into the skin every 6 (six)  months. Patient not taking: Reported on 07/13/2022    [provider]  diphenhydramine-acetaminophen (TYLENOL PM) 25-500 MG TABS tablet Take 1 tablet by mouth at bedtime as needed (Prn as needed.).    [provider]  ezetimibe (ZETIA) 10 MG tablet Take 1 tablet (10 mg total) by mouth daily. 12/04/21   Chilton Si, MD  furosemide (LASIX) 40 MG tablet TAKE 1 TABLET BY MOUTH TWICE DAILY AS NEEDED FOR FLUID Patient taking differently: Take 40 mg by mouth 2 (two) times daily. 10/01/21   [name], MD  insulin NPH-regular Human (NOVOLIN 70/30 RELION) (70-30) 100 UNIT/ML injection 26 units with breakfast and 2 units with the evening meal. 03/14/21   Romero Belling, MD  metoprolol succinate (TOPROL-XL) 50 MG 24 hr tablet TAKE 1 TABLET BY MOUTH ONCE DAILY . APPOINTMENT REQUIRED FOR FUTURE REFILLS Patient taking differently: Take 50 mg by mouth daily. 06/15/22   Chilton Si, MD  Multiple Vitamins-Minerals (MULTIVITAMIN ADULT PO) Take 1 tablet by mouth daily with breakfast.     [provider]  sulfamethoxazole-trimethoprim (BACTRIM DS) 800-160 MG tablet Take 1 tablet by mouth daily for 10 doses. 07/10/22 07/20/22  Dorcas Carrow, MD  VITAMIN D, CHOLECALCIFEROL, PO Take by mouth.    [provider]  warfarin (COUMADIN) 2.5 MG tablet Take 0.5 tablets (1.25 mg total) by mouth daily. 07/10/22   Dorcas Carrow, MD      Allergies    Procaine  hcl and Amoxicillin    Review of Systems   Review of Systems  Constitutional:  Positive for activity change, appetite change and fatigue.  Respiratory:  Positive for shortness of breath.   Cardiovascular:  Positive for chest pain and syncope.  Gastrointestinal:  Positive for nausea and vomiting.  Musculoskeletal:  Positive for myalgias.  Neurological:  Positive for syncope and weakness (Generalized).  Hematological:  Bruises/bleeds easily (On warfarin).    Physical Exam Updated Vital Signs BP 121/67   Pulse (!) 104   Temp  97.6 F (36.4 C) (Oral)   Resp (!) 24   SpO2 94%  Physical Exam Vitals and nursing note reviewed.  Constitutional:      General: She is not in acute distress.    Appearance: She is well-developed and normal weight. She is ill-appearing. She is not toxic-appearing or diaphoretic.  HENT:     Head: Normocephalic and atraumatic.     Right Ear: External ear normal.     Left Ear: External ear normal.     Nose: Nose normal.     Mouth/Throat:     Mouth: Mucous membranes are moist.  Eyes:     Extraocular Movements: Extraocular movements intact.     Conjunctiva/sclera: Conjunctivae normal.  Cardiovascular:     Rate and Rhythm: Normal rate and regular rhythm.     Heart sounds: No murmur heard. Pulmonary:     Effort: Pulmonary effort is normal. No respiratory distress.     Breath sounds: Normal breath sounds. No wheezing or rales.  Chest:     Chest wall: No tenderness.  Abdominal:     General: There is no distension.     Palpations: Abdomen is soft.     Tenderness: There is no abdominal tenderness.  Genitourinary:    Comments: Foley catheter in place.  Bag contains yellow urine.  Bag was last emptied this morning. Musculoskeletal:        General: No swelling.     Cervical back: Normal range of motion and neck supple.     Right lower leg: No edema.     Left lower leg: No edema.  Skin:    General: Skin is warm and dry.     Coloration: Skin is pale. Skin is not jaundiced.  Neurological:     General: No focal deficit present.     Mental Status: She is alert and oriented to person, place, and time.  Psychiatric:        Mood and Affect: Mood normal.        Behavior: Behavior is slowed.     ED Results / Procedures / Treatments   Labs (all labs ordered are listed, but only abnormal results are displayed) Labs Reviewed  CBC WITH DIFFERENTIAL/PLATELET - Abnormal; Notable for the following components:      Result Value   WBC 13.7 (*)    RBC 3.71 (*)    Hemoglobin 10.9 (*)    HCT  33.7 (*)    Platelets 569 (*)    Neutro Abs 11.4 (*)    Monocytes Absolute 1.2 (*)    Abs Immature Granulocytes 0.09 (*)    All other components within normal limits  COMPREHENSIVE METABOLIC PANEL - Abnormal; Notable for the following components:   Sodium 131 (*)    Chloride 93 (*)    Glucose, Bld 300 (*)    BUN 37 (*)    Creatinine, Ser 2.82 (*)    Calcium 8.5 (*)    Albumin 2.3 (*)  GFR, Estimated 16 (*)    All other components within normal limits  PROTIME-INR - Abnormal; Notable for the following components:   Prothrombin Time 27.8 (*)    INR 2.6 (*)    All other components within normal limits  BRAIN NATRIURETIC PEPTIDE - Abnormal; Notable for the following components:   B Natriuretic Peptide 1,142.6 (*)    All other components within normal limits  CBG MONITORING, ED - Abnormal; Notable for the following components:   Glucose-Capillary 287 (*)    All other components within normal limits  I-STAT CHEM 8, ED - Abnormal; Notable for the following components:   Sodium 132 (*)    Chloride 96 (*)    BUN 37 (*)    Creatinine, Ser 2.90 (*)    Glucose, Bld 295 (*)    Calcium, Ion 1.10 (*)    All other components within normal limits  I-STAT VENOUS BLOOD GAS, ED - Abnormal; Notable for the following components:   pCO2, Ven 41.7 (*)    pO2, Ven 27 (*)    Sodium 132 (*)    Calcium, Ion 1.11 (*)    All other components within normal limits  CULTURE, BLOOD (ROUTINE X 2)  CULTURE, BLOOD (ROUTINE X 2)  URINE CULTURE  RESP PANEL BY RT-PCR (RSV, FLU A&B, COVID)  [id number]  LACTIC ACID, PLASMA  LACTIC ACID, PLASMA  URINALYSIS, ROUTINE W REFLEX MICROSCOPIC  TYPE AND SCREEN  TROPONIN I (HIGH SENSITIVITY)  TROPONIN I (HIGH SENSITIVITY)    EKG EKG Interpretation  Date/Time:  Monday Jul 20 2022 10:02:23 EDT Ventricular Rate:  121 PR Interval:    QRS Duration: 106 QT Interval:  349 QTC Calculation: 496 R Axis:   1 Text Interpretation: Atrial flutter Low voltage, precordial  leads Confirmed by Gloris Manchester [phone]) on 07/20/2022 10:46:59 AM  Radiology CT CHEST ABDOMEN PELVIS WO CONTRAST  Result Date: 07/20/2022 CLINICAL DATA:  Fall. EXAM: CT CHEST, ABDOMEN AND PELVIS WITHOUT CONTRAST TECHNIQUE: Multidetector CT imaging of the chest, abdomen and pelvis was performed following the standard protocol without IV contrast. RADIATION DOSE REDUCTION: This exam was performed according to the departmental dose-optimization program which includes automated exposure control, adjustment of the mA and/or kV according to patient size and/or use of iterative reconstruction technique. COMPARISON:  Jul 07, 2022. FINDINGS: CT CHEST FINDINGS Cardiovascular: Atherosclerosis of thoracic aorta is noted without aneurysm or dissection. Normal cardiac size. No pericardial effusion. Coronary artery calcifications are noted. Mediastinum/Nodes: Small sliding-type hiatal hernia. No adenopathy. Thyroid gland is unremarkable. Lungs/Pleura: No pneumothorax or pleural effusion is noted. Minimal right middle lobe and lingular subsegmental atelectasis or scarring is noted. 4 mm right middle lobe nodule is noted on image 106 of series 5. Musculoskeletal: No chest wall mass or suspicious bone lesions identified. CT ABDOMEN PELVIS FINDINGS Hepatobiliary: Cholelithiasis. No biliary dilatation. Liver is unremarkable on these unenhanced images. Pancreas: Stable cystic lesion seen in pancreatic tail, largest measuring 13 mm. No acute inflammation or ductal dilatation is noted. Spleen: Normal in size without focal abnormality. Adrenals/Urinary Tract: Adrenal glands are unremarkable. Stable right renal cyst is noted. Right renal cortical scarring is again noted. Stable left renal cyst. Small nonobstructive left renal calculus is noted. Mild left hydroureteronephrosis is noted without evidence of obstructing calculus. Urinary bladder is decompressed secondary to Foley catheter. 8 mm calculus is noted in urinary bladder.  Stomach/Bowel: Stomach is within normal limits. Appendix appears normal. No evidence of bowel wall thickening, distention, or inflammatory changes. Vascular/Lymphatic: Aortic atherosclerosis. No enlarged  abdominal or pelvic lymph nodes. Reproductive: Uterus and bilateral adnexa are unremarkable. Other: No abdominal wall hernia or abnormality. No abdominopelvic ascites. Musculoskeletal: Status post surgical repair of both proximal femurs. No acute osseous abnormality is noted. Old L1 compression fracture is noted. IMPRESSION: No acute traumatic injury seen in the chest, abdomen or pelvis. 4 mm right middle lobe nodule. No follow-up needed if patient is low-risk.This recommendation follows the consensus statement: Guidelines for Management of Incidental Pulmonary Nodules Detected on CT Images: From the Fleischner Society 2017; Radiology 2017; 284:228-243. Small nonobstructive left renal calculus. Mild left hydroureteronephrosis is noted without evidence of obstructing calculus. Urinary bladder is decompressed secondary to Foley catheter. 8 mm calculus noted in urinary bladder. Cholelithiasis without inflammation. Stable cystic lesion seen in pancreatic tail, largest measuring 13 mm. Old L1 compression fracture as well surgical internal fixation of old proximal femoral fractures. Aortic Atherosclerosis (ICD10-I70.0). Electronically Signed   By: Lupita Raider M.D.   On: 07/20/2022 11:10   CT HEAD WO CONTRAST  Result Date: 07/20/2022 CLINICAL DATA:  Status post fall. EXAM: CT HEAD WITHOUT CONTRAST CT CERVICAL SPINE WITHOUT CONTRAST TECHNIQUE: Multidetector CT imaging of the head and cervical spine was performed following the standard protocol without intravenous contrast. Multiplanar CT image reconstructions of the cervical spine were also generated. RADIATION DOSE REDUCTION: This exam was performed according to the departmental dose-optimization program which includes automated exposure control, adjustment of the  mA and/or kV according to patient size and/or use of iterative reconstruction technique. COMPARISON:  None Available. FINDINGS: CT HEAD FINDINGS Brain: No evidence of acute infarction, hemorrhage, hydrocephalus, extra-axial collection or mass lesion/mass effect. Previous left MCA infarct is again noted predominantly involving the left temporal lobe. There is mild low-attenuation within the subcortical and periventricular white matter compatible with chronic microvascular disease. Vascular: No hyperdense vessel or unexpected calcification. Skull: Normal. Negative for fracture or focal lesion. Sinuses/Orbits: No acute finding. Other: None. CT CERVICAL SPINE FINDINGS Alignment: Normal. Skull base and vertebrae: No acute fracture. No primary bone lesion or focal pathologic process. Soft tissues and spinal canal: No prevertebral fluid or swelling. No visible canal hematoma. Disc levels: There is multilevel disc space narrowing and endplate spurring within the cervical spine. Solid fusion of the C6-7 vertebra noted. Upper chest: Negative. Other: None IMPRESSION: 1. No acute intracranial abnormality. 2. Chronic left MCA infarct and small vessel ischemic change. 3. No evidence for cervical spine fracture or subluxation. 4. Cervical degenerative disc disease. Electronically Signed   By: Signa Kell M.D.   On: 07/20/2022 11:09   CT CERVICAL SPINE WO CONTRAST  Result Date: 07/20/2022 CLINICAL DATA:  Status post fall. EXAM: CT HEAD WITHOUT CONTRAST CT CERVICAL SPINE WITHOUT CONTRAST TECHNIQUE: Multidetector CT imaging of the head and cervical spine was performed following the standard protocol without intravenous contrast. Multiplanar CT image reconstructions of the cervical spine were also generated. RADIATION DOSE REDUCTION: This exam was performed according to the departmental dose-optimization program which includes automated exposure control, adjustment of the mA and/or kV according to patient size and/or use of  iterative reconstruction technique. COMPARISON:  None Available. FINDINGS: CT HEAD FINDINGS Brain: No evidence of acute infarction, hemorrhage, hydrocephalus, extra-axial collection or mass lesion/mass effect. Previous left MCA infarct is again noted predominantly involving the left temporal lobe. There is mild low-attenuation within the subcortical and periventricular white matter compatible with chronic microvascular disease. Vascular: No hyperdense vessel or unexpected calcification. Skull: Normal. Negative for fracture or focal lesion. Sinuses/Orbits: No acute finding. Other:  None. CT CERVICAL SPINE FINDINGS Alignment: Normal. Skull base and vertebrae: No acute fracture. No primary bone lesion or focal pathologic process. Soft tissues and spinal canal: No prevertebral fluid or swelling. No visible canal hematoma. Disc levels: There is multilevel disc space narrowing and endplate spurring within the cervical spine. Solid fusion of the C6-7 vertebra noted. Upper chest: Negative. Other: None IMPRESSION: 1. No acute intracranial abnormality. 2. Chronic left MCA infarct and small vessel ischemic change. 3. No evidence for cervical spine fracture or subluxation. 4. Cervical degenerative disc disease. Electronically Signed   By: Signa Kell M.D.   On: 07/20/2022 11:09   DG Chest Port 1 View  Result Date: 07/20/2022 CLINICAL DATA:  Sepsis EXAM: PORTABLE CHEST 1 VIEW COMPARISON:  CXR 06/29/22 FINDINGS: No pleural effusion. No pneumothorax. Normal cardiac and mediastinal contours. Bibasilar atelectasis. No radiographically apparent displaced rib fractures. Visualized upper abdomen is unremarkable. IMPRESSION: Bibasilar atelectasis. Electronically Signed   By: Lorenza Cambridge M.D.   On: 07/20/2022 10:29    Procedures Procedures    Medications Ordered in ED Medications  lactated ringers bolus 500 mL (500 mLs Intravenous New Bag/Given 07/20/22 1132)  ondansetron (ZOFRAN) injection 4 mg (4 mg Intravenous Given  07/20/22 1133)    ED Course/ Medical Decision Making/ A&P                             Medical Decision Making Amount and/or Complexity of Data Reviewed Labs: ordered. Radiology: ordered.  Risk Prescription drug management. Decision regarding hospitalization.   This patient presents to the ED for concern of multiple complaints, this involves an extensive number of treatment options, and is a complaint that carries with it a high risk of complications and morbidity.  The differential diagnosis includes infection, dehydration, metabolic derangements, polypharmacy, ACS   Co morbidities that complicate the patient evaluation  HTN, HLD, atrial fibrillation, seizures, GERD, DM, CAD, CKD, CHF, osteoporosis   Additional history obtained:  Additional history obtained from patient's husband External records from outside source obtained and reviewed including EMR   Lab Tests:  I Ordered, and personally interpreted labs.  The pertinent results include: AKI is present raising concern for Bactrim toxicity; glucose is moderately elevated without evidence of DKA; leukocytosis is present; anemia is baseline; troponin is normal; INR therapeutic.   Imaging Studies ordered:  I ordered imaging studies including chest x-ray, CT of head, cervical spine, chest, abdomen, pelvis I independently visualized and interpreted imaging which showed no acute findings I agree with the radiologist interpretation   Cardiac Monitoring: / EKG:  The patient was maintained on a cardiac monitor.  I personally viewed and interpreted the cardiac monitored which showed an underlying rhythm of: Atrial fibrillation/flutter   Problem List / ED Course / Critical interventions / Medication management  Patient presents for multiple complaints.  Among these are intermittent chest pain and shortness of breath over the past week, worsened today; nausea and vomiting since yesterday, also worsened today; severe fatigue and  generalized weakness.  Vital signs are normal on arrival in the ED.  Patient is ill-appearing.  She is found resting on stretcher with eyes closed.  She is alert and oriented.  She does not have any focal neurologic deficits.  She endorses a generalized pain.  She has tenderness to area of right thigh.  Foley catheter is in place, which was placed 2 weeks ago.  Drainage bag contains yellow urine.  Broad diagnostic workup was initiated.  Given her fluid losses and p.o. intolerance, gentle IV fluids were ordered.  Patient had nausea while in the ED and Zofran was ordered.  Lab work is notable for AKI.  Given decreased kidney function, this does raise concerns for Bactrim toxicity, the symptoms of which would be consistent with what patient is describing.  Urinalysis today showed evidence of infection.  Patient was started on antibiotics for empiric treatment of recurrent/persistent UTI while cultures are pending.  Patient was admitted for further management. I ordered medication including IV fluids for hydration; Zofran for nausea Reevaluation of the patient after these medicines showed that the patient improved I have reviewed the patients home medicines and have made adjustments as needed   Social Determinants of Health:  Lives at home with husband         Final Clinical Impression(s) / ED Diagnoses Final diagnoses:  AKI (acute kidney injury) (HCC)  Syncope and collapse    Rx / DC Orders ED Discharge Orders     None         Gloris Manchester, MD 07/20/22 1203    Gloris Manchester, MD 07/20/22 1441

## 2022-07-21 ENCOUNTER — Encounter: Payer: Medicare Other | Admitting: Urology

## 2022-07-21 DIAGNOSIS — E11649 Type 2 diabetes mellitus with hypoglycemia without coma: Secondary | ICD-10-CM | POA: Diagnosis not present

## 2022-07-21 DIAGNOSIS — N179 Acute kidney failure, unspecified: Secondary | ICD-10-CM | POA: Diagnosis present

## 2022-07-21 DIAGNOSIS — Z7409 Other reduced mobility: Secondary | ICD-10-CM | POA: Diagnosis present

## 2022-07-21 DIAGNOSIS — R55 Syncope and collapse: Secondary | ICD-10-CM | POA: Diagnosis present

## 2022-07-21 DIAGNOSIS — I480 Paroxysmal atrial fibrillation: Secondary | ICD-10-CM | POA: Diagnosis present

## 2022-07-21 DIAGNOSIS — I130 Hypertensive heart and chronic kidney disease with heart failure and stage 1 through stage 4 chronic kidney disease, or unspecified chronic kidney disease: Secondary | ICD-10-CM | POA: Diagnosis present

## 2022-07-21 DIAGNOSIS — E785 Hyperlipidemia, unspecified: Secondary | ICD-10-CM | POA: Diagnosis present

## 2022-07-21 DIAGNOSIS — I5022 Chronic systolic (congestive) heart failure: Secondary | ICD-10-CM | POA: Diagnosis present

## 2022-07-21 DIAGNOSIS — E88819 Insulin resistance, unspecified: Secondary | ICD-10-CM | POA: Diagnosis present

## 2022-07-21 DIAGNOSIS — R569 Unspecified convulsions: Secondary | ICD-10-CM | POA: Diagnosis present

## 2022-07-21 DIAGNOSIS — E1165 Type 2 diabetes mellitus with hyperglycemia: Secondary | ICD-10-CM | POA: Diagnosis present

## 2022-07-21 DIAGNOSIS — Z1152 Encounter for screening for COVID-19: Secondary | ICD-10-CM | POA: Diagnosis not present

## 2022-07-21 DIAGNOSIS — N138 Other obstructive and reflux uropathy: Secondary | ICD-10-CM | POA: Diagnosis present

## 2022-07-21 DIAGNOSIS — N184 Chronic kidney disease, stage 4 (severe): Secondary | ICD-10-CM | POA: Diagnosis present

## 2022-07-21 DIAGNOSIS — N1339 Other hydronephrosis: Secondary | ICD-10-CM | POA: Diagnosis present

## 2022-07-21 DIAGNOSIS — K8020 Calculus of gallbladder without cholecystitis without obstruction: Secondary | ICD-10-CM | POA: Diagnosis present

## 2022-07-21 DIAGNOSIS — K219 Gastro-esophageal reflux disease without esophagitis: Secondary | ICD-10-CM | POA: Diagnosis present

## 2022-07-21 DIAGNOSIS — D631 Anemia in chronic kidney disease: Secondary | ICD-10-CM | POA: Diagnosis present

## 2022-07-21 DIAGNOSIS — R8271 Bacteriuria: Secondary | ICD-10-CM | POA: Diagnosis present

## 2022-07-21 DIAGNOSIS — R531 Weakness: Secondary | ICD-10-CM | POA: Diagnosis present

## 2022-07-21 DIAGNOSIS — E871 Hypo-osmolality and hyponatremia: Secondary | ICD-10-CM | POA: Diagnosis present

## 2022-07-21 DIAGNOSIS — I255 Ischemic cardiomyopathy: Secondary | ICD-10-CM | POA: Diagnosis present

## 2022-07-21 DIAGNOSIS — Z960 Presence of urogenital implants: Secondary | ICD-10-CM | POA: Diagnosis present

## 2022-07-21 DIAGNOSIS — Z794 Long term (current) use of insulin: Secondary | ICD-10-CM | POA: Diagnosis not present

## 2022-07-21 DIAGNOSIS — E1122 Type 2 diabetes mellitus with diabetic chronic kidney disease: Secondary | ICD-10-CM | POA: Diagnosis present

## 2022-07-21 LAB — BASIC METABOLIC PANEL
Anion gap: 11 (ref 5–15)
BUN: 33 mg/dL — ABNORMAL HIGH (ref 8–23)
CO2: 22 mmol/L (ref 22–32)
Calcium: 7.9 mg/dL — ABNORMAL LOW (ref 8.9–10.3)
Chloride: 102 mmol/L (ref 98–111)
Creatinine, Ser: 2.27 mg/dL — ABNORMAL HIGH (ref 0.44–1.00)
GFR, Estimated: 21 mL/min — ABNORMAL LOW (ref >60)
Glucose, Bld: 127 mg/dL — ABNORMAL HIGH (ref 70–99)
Potassium: 4.7 mmol/L (ref 3.5–5.1)
Sodium: 135 mmol/L (ref 135–145)

## 2022-07-21 LAB — GLUCOSE, CAPILLARY
Glucose-Capillary: 121 mg/dL — ABNORMAL HIGH (ref 70–99)
Glucose-Capillary: 151 mg/dL — ABNORMAL HIGH (ref 70–99)
Glucose-Capillary: 136 mg/dL — ABNORMAL HIGH (ref 70–99)
Glucose-Capillary: 53 mg/dL — ABNORMAL LOW (ref 70–99)
Glucose-Capillary: 59 mg/dL — ABNORMAL LOW (ref 70–99)
Glucose-Capillary: 62 mg/dL — ABNORMAL LOW (ref 70–99)
Glucose-Capillary: 96 mg/dL (ref 70–99)

## 2022-07-21 LAB — CBC
HCT: 28.3 % — ABNORMAL LOW (ref 36.0–46.0)
Hemoglobin: 9.3 g/dL — ABNORMAL LOW (ref 12.0–15.0)
MCH: 29.8 pg (ref 26.0–34.0)
MCHC: 32.9 g/dL (ref 30.0–36.0)
MCV: 90.7 fL (ref 80.0–100.0)
Platelets: 519 10*3/uL — ABNORMAL HIGH (ref 150–400)
RBC: 3.12 MIL/uL — ABNORMAL LOW (ref 3.87–5.11)
RDW: 13.2 % (ref 11.5–15.5)
WBC: 12 10*3/uL — ABNORMAL HIGH (ref 4.0–10.5)
nRBC: 0 % (ref 0.0–0.2)

## 2022-07-21 LAB — CULTURE, BLOOD (ROUTINE X 2)
Culture: NO GROWTH
Culture: NO GROWTH

## 2022-07-21 LAB — PROTIME-INR
INR: 3.1 — ABNORMAL HIGH (ref 0.8–1.2)
Prothrombin Time: 32.6 seconds — ABNORMAL HIGH (ref 11.4–15.2)

## 2022-07-21 MED ORDER — SODIUM CHLORIDE 0.9 % IV SOLN: INTRAVENOUS | Status: DC

## 2022-07-21 MED ORDER — WARFARIN 0.5 MG HALF TABLET
0.5000 mg | ORAL_TABLET | Freq: Once | ORAL | Status: AC
  Administered 2022-07-21: 0.5 mg via ORAL
  Filled 2022-07-21: qty 1

## 2022-07-21 NOTE — TOC Initial Note (Signed)
Transition of Care [facility]) - Initial/Assessment Note    Patient Details  Name: [name] [name] MRN: [id number] Date of Birth: October 06, 1940  Transition of Care [facility]) CM/SW Contact:    Tom-Johnson, Hershal Coria, RN Phone Number: 07/21/2022, 3:17 PM  Clinical Narrative:                  CM spoke with patient at bedside about needs for post hospital transition. Admitted for after Syncopal episode at home. PCP referred to the ED. Patient found to have AKI.  Patient was recently hospitalized for Hematuria/ESBL Klebsiella. Treated with continuous bladder irrigation d/t elevated INR and IV abx. Patient discharged on 07/10/22 on Oral abx with Foley Catheter in place. Patient has hx of  DM2, HTN, HLD, A-fib on Coumadin, CAD, Ischemic cardiomyopathy, Systolic CHF, CKD, GERD, Seizures, ESBL UTI.   Patient is from home with husband, has one son. Sister and grandchildren supportive. Husband drives to and from appointments.  Has a cane, rollator, shower seat and grab bars at home.   PCP is [name], MD and uses [facility] on [address] in [place].   Home health PT recommended, patient states she has no preference. CM called in referral to [facility] and [facility] voiced acceptance, info on AVS.   CM will continue to follow as patient progresses with care towards discharge.        Expected Discharge Plan: Home w Home Health Services Barriers to Discharge: Continued Medical Work up   Patient Goals and CMS Choice Patient states their goals for this hospitalization and ongoing recovery are:: To return home CMS Medicare.gov Compare Post Acute Care list provided to:: Patient Choice offered to / list presented to : Patient      Expected Discharge Plan and Services   Discharge Planning Services: CM Consult Post Acute Care Choice: Home Health Living arrangements for the past 2 months: Single Family Home                 DME Arranged: N/A DME Agency: NA       HH Arranged: PT HH  Agency: [facility] Date [facility] Agency Contacted: 07/21/22 Time HH Agency Contacted: 1410 Representative spoke with at [facility] Agency: Kandee Keen  Prior Living Arrangements/Services Living arrangements for the past 2 months: Single Family Home Lives with:: Spouse Patient language and need for interpreter reviewed:: Yes        Need for Family Participation in Patient Care: Yes (Comment) Care giver support system in place?: Yes (comment) Current home services: DME (Cane, rollator, shower seat, grab bars) Criminal Activity/Legal Involvement Pertinent to Current Situation/Hospitalization: No - Comment as needed  Activities of Daily Living Home Assistive Devices/Equipment: Environmental consultant (specify type) (rolator) ADL Screening (condition at time of admission) Patient's cognitive ability adequate to safely complete daily activities?: Yes Is the patient deaf or have difficulty hearing?: No Does the patient have difficulty seeing, even when wearing glasses/contacts?: Yes Does the patient have difficulty concentrating, remembering, or making decisions?: No Patient able to express need for assistance with ADLs?: Yes Does the patient have difficulty dressing or bathing?: No Independently performs ADLs?: Yes (appropriate for developmental age) Does the patient have difficulty walking or climbing stairs?: No Weakness of Legs: Both Weakness of Arms/Hands: None  Permission Sought/Granted Permission sought to share information with : Case Manager, Magazine features editor, Family Supports Permission granted to share information with : Yes, Verbal Permission Granted  Emotional Assessment Appearance:: Appears stated age Attitude/Demeanor/Rapport: Engaged, Gracious Affect (typically observed): Accepting, Appropriate, Calm, Hopeful, Pleasant Orientation: : Oriented to Self, Oriented to Place, Oriented to  Time, Oriented to Situation Alcohol / Substance Use: Not Applicable Psych  Involvement: No (comment)  Admission diagnosis:  Syncope and collapse [R55] AKI (acute kidney injury) (HCC) [N17.9] Patient Active Problem List   Diagnosis Date Noted   AKI (acute kidney injury) (HCC) 07/20/2022   Supratherapeutic INR 07/08/2022   Hemorrhagic cystitis 07/08/2022   Gross hematuria 07/07/2022   CKD (chronic kidney disease) stage 4, GFR 15-29 ml/min (HCC) 04/10/2020   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   Osteoporosis 07/22/2018   Closed compression fracture of L1 vertebra (HCC) 07/19/2018   Cardiomyopathy (HCC) 04/07/2018   Foot ulcer (HCC) 07/24/2016   Gram-negative infection    CKD (chronic kidney disease) stage 3, GFR 30-59 ml/min (HCC)    Postoperative anemia due to acute blood loss 01/04/2016   Left hip postoperative wound infection 01/02/2016   CAD S/P percutaneous coronary angioplasty 05/20/2015   Acute systolic CHF (congestive heart failure) (HCC) 05/20/2015   PAF (paroxysmal atrial fibrillation) (HCC) 05/20/2015   Acute kidney injury superimposed on chronic kidney disease (HCC) 05/11/2015   PCP NOTES >>>>>>>>>>>>>>>> 12/28/2014   Memory loss 05/17/2014   Diabetes (HCC) 01/23/2014   Anxiety and depression, PCP notes  11/24/2013   Severe obesity (BMI >= 40) (HCC) 07/14/2013   Encounter for therapeutic drug monitoring 06/02/2013   History of encephalitis 11/18/2012   Intertrochanteric fracture of right hip (HCC) 07/13/2012   GERD (gastroesophageal reflux disease) 10/05/2011   Seizure disorder (HCC) 05/24/2011   Annual physical exam >>>>>>>>>>>>>>>>>>>>>>>>>>>>>>>.. 02/10/2011   Atrial fibrillation (HCC)    LUNG NODULE 09/01/2006   Hyperlipidemia 03/27/2006   Hereditary and idiopathic peripheral neuropathy 03/27/2006   RETINOPATHY, BACKGROUND NOS 03/27/2006   Essential hypertension 03/27/2006   PCP:  [name], MD Pharmacy:   [facility] [address] [place], [place] - [address] [place] [address] Phone:  [phone] Fax: [phone]  [facility] [address] [place], [place] - [address] [place] [address] Phone: [phone] Fax: [phone]     Social Determinants of Health (SDOH) Social History: SDOH Screenings   Food Insecurity: No Food Insecurity (07/20/2022)  Housing: Low Risk  (07/20/2022)  Transportation Needs: No Transportation Needs (07/20/2022)  Utilities: Not At Risk (07/20/2022)  Alcohol Screen: Low Risk  (08/22/2020)  Depression (PHQ2-9): Low Risk  (05/05/2022)  Financial Resource Strain: Low Risk  (08/22/2020)  Physical Activity: Inactive (08/22/2020)  Social Connections: Moderately Integrated (08/22/2020)  Stress: No Stress Concern Present (08/22/2020)  Tobacco Use: Medium Risk (07/20/2022)   SDOH Interventions: Transportation Interventions: Intervention Not Indicated, Inpatient TOC, Patient Resources (Friends/Family)   Readmission Risk Interventions     No data to display

## 2022-07-21 NOTE — Progress Notes (Signed)
PROGRESS NOTE  [name] [name]  DOB: [birth year]  PCP: [name], MD GNF:[id number]  DOA: 07/20/2022  LOS: 0 days  Hospital Day: 2  Brief narrative: [name] [name] is a 82 y.o. female with PMH significant for DM2, HTN, HLD, A-fib on Coumadin, CAD, ischemic cardiomyopathy, systolic CHF, CKD, GERD, seizures, ESBL UTI Recently hospitalized 5/7 to 5/10 for hematuria in the setting of elevated INR to 7.9, treated with continuous bladder irrigation, also treated for ESBL Klebsiella with IV meropenem and discharged home on oral Bactrim and with Foley catheter in situ.[name] Post discharge, patient continued to have generalized weakness, poor oral intake, missed appointments. 5/20, patient also threw up and looked altered.   Husband called PCP who directed her to ED.   While walking with a walker to the car, patient fell to the grass.  Husband assisted her back to the chair but she had approximately 22nd episode of syncope.  Upon waking up, she was alert and oriented but continued to feel unwell.  She also had pain in her pelvis and both thighs.  In the ED, patient was afebrile, borderline tachycardic, blood pressure in normal range Labs showed WBC count elevated to 13.7, hemoglobin low at 10.9, INR elevated to 2.6, glucose at 300, sodium 131, BUN 37/2.82 against creatinine 1.4 on discharge 2 weeks ago, BNP more than 1100 Urinalysis showed hazy yellow urine with moderate hemoglobin, large leukocytes, many bacteria Blood culture and urine culture were collected Respiratory virus panel unremarkable Right femur x-ray did not show any evidence of fracture CT head did not show any acute intracranial abnormality.  It showed chronic left MCA infarct and small vessel ischemic change CT cervical spine did not show  cervical spine fracture/subluxation.  It showed cervical degenerative disc disease. CT chest abdomen pelvis did not show any evidence of acute traumatic injury or acute intra-abdominal process.   It showed a small nonobstructing left renal calculus with mild left hydroureteronephrosis, 8 mm calculus in the urinary bladder Patient was started on IV fluid Admitted to [facility]  Subjective: Patient was seen and examined this morning.  Pleasant elderly Caucasian female.  Propped up in bed.  Not in distress.  Alert, awake, able to detailed history.  Husband not at bedside. Chart reviewed.  Remains hemodynamically stable Last set of labs from this morning with creatinine down to 2.27, WBC 12, hemoglobin 9.3  Assessment and plan: AKI on CKD stage IV Recent history of hematuria Baseline creatinine 1.41 from 07/10/2022  Present with creatinine elevated to 2.9 due to volume contraction from poor oral intake.  Imaging showed mild left hydroureteronephrosis and 8 mm nonobstructing bladder stone.   Creatinine is currently improving after IV fluid bolus in ED.  Resume IV hydration with NS at 75 mill per hour for next 24 hours.   Urinalysis showed moderate amount of hemoglobin but no gross hematuria noted in urine in Foley. She was discharged with Foley catheter last admission.  Foley was changed in ED.  It seems admitting physician discussed on-call Dr. Urology Dr. Annabell Howells, who recommended if patient condition remained stable and worsening kidney function likely can perform void trial and possible DC continue Foley this hospitalization.  Will see if creatinine continues to improve and plan voiding trial tomorrow. Recent Labs    01/20/22 1255 05/05/22 1231 06/30/22 0024 07/07/22 1637 07/08/22 8469 07/09/22 0348 07/10/22 0343 07/20/22 1011 07/20/22 1100 07/21/22 0437  BUN 30* 33* 31* 29* 29* 29* 23 37* 37* 33*  CREATININE 1.39* 1.88* 1.68*  1.83* 1.74* 1.66* 1.41* 2.82* 2.90* 2.27*    Recent ESBL UTI Completed course of Bactrim 2 days ago. Urinalysis showed leukocytes and bacteria but no clear evidence of infection.  It seems that the admitting physician discussed with on-call ID Dr. Earlene Plater who  suggested to hold off antibiotics for now. Continue to monitor Recent Labs  Lab 07/20/22 1011 07/20/22 1013 07/21/22 0437  WBC 13.7*  --  12.0*  LATICACIDVEN  --  1.6  --    Chronic systolic CHF Ischemic cardiomyopathy Essential hypertension PTA on Toprol 50 mg daily, Lasix 40 mg twice daily Resume metoprolol.  Keep Lasix on hold because of AKI  Type 2 diabetes mellitus uncontrolled with hyperglycemia A1c 9 on 05/05/2022 PTA on ReliOn 70/30 insulin 26 units a.m., 2 units p.m. Currently ordered for Semglee 14 units daily with SSI/Accu-Cheks. Recent Labs  Lab 07/20/22 1031 07/20/22 1600 07/20/22 2146 07/21/22 0744 07/21/22 1109  GLUCAP 287* 255* 139* 121* 151*   CAD/HLD Continue Coumadin and Zetia 10 mg daily  Paroxysmal A-fib PTA on metoprolol and Coumadin Continue both. INR trend as below.  Target INR 2-3.  Pharmacy managing Recent Labs  Lab 07/20/22 1011 07/21/22 0437  INR 2.6* 3.1*   Chronic anemia  GERD Continue PPI.  Hemoglobin baseline close to 10.  Was hemoconcentrated on admission.  Hemoglobin now close to baseline. Recent Labs    07/10/22 0343 07/20/22 1011 07/20/22 1100 07/20/22 1101 07/21/22 0437  HGB 10.5* 10.9* 13.6 14.3 9.3*  MCV 93.9 90.8  --   --  90.7   Cholelithiasis without cholecystitis  Impaired mobility Physical deconditioning Old L1 compression fracture Old proximal femur fractures s/p prior fixations Complains of right hip pain but x-ray negative for fracture. For pain management, patient is currently on as needed Tylenol only. PT eval ordered.   Goals of care   Code Status: Full Code     DVT prophylaxis:   warfarin (COUMADIN) tablet 0.5 mg   Antimicrobials: 1 dose of IV Zosyn was given in the ED.  Not continued for now. Fluid: NS at 75 mill per hour Consultants: None Family Communication: None at bedside  Status: Observation Level of care:  Med-Surg   Patient from: Home Anticipated d/c to: Pending clinical  course, pending PT eval Needs to continue in-hospital care:  Needs IV hydration, PT eval, renal function monitoring.    Diet:  Diet Order             Diet heart healthy/carb modified Room service appropriate? Yes; Fluid consistency: Thin  Diet effective now                   Scheduled Meds:  Chlorhexidine Gluconate Cloth  6 each Topical Daily   ezetimibe  10 mg Oral Daily   insulin aspart  0-15 Units Subcutaneous TID WC   insulin aspart  0-5 Units Subcutaneous QHS   insulin glargine-yfgn  14 Units Subcutaneous Daily   linagliptin  5 mg Oral Daily   metoprolol succinate  50 mg Oral Daily   warfarin  0.5 mg Oral ONCE-1600   Warfarin - Pharmacist Dosing Inpatient   Does not apply q1600    PRN meds: acetaminophen, hydrALAZINE, ondansetron (ZOFRAN) IV   Infusions:   sodium chloride      Antimicrobials: Anti-infectives (From admission, onward)    Start     Dose/Rate Route Frequency Ordered Stop   07/20/22 1445  piperacillin-tazobactam (ZOSYN) IVPB 3.375 g  Status:  Discontinued  3.375 g 100 mL/hr over 30 Minutes Intravenous  Once 07/20/22 1442 07/20/22 1457       Nutritional status:  Body mass index is 23.06 kg/m.          Objective: Vitals:   07/21/22 0812 07/21/22 1123  BP: (!) 121/53 (!) 128/56  Pulse: 79 67  Resp: 18   Temp: 98 F (36.7 C)   SpO2: 96%     Intake/Output Summary (Last 24 hours) at 07/21/2022 1135 Last data filed at 07/21/2022 1037 Gross per 24 hour  Intake 3666.96 ml  Output 875 ml  Net 2791.96 ml   Filed Weights   07/20/22 1552  Weight: 72.9 kg   Weight change:  Body mass index is 23.06 kg/m.   Physical Exam: General exam: Pleasant, elderly Caucasian female.  Not in physical distress.  Foley catheter with clear urine Skin: No rashes, lesions or ulcers. HEENT: Atraumatic, normocephalic, no obvious bleeding Lungs: Clear to auscultation bilaterally CVS: Regular rate and rhythm, no murmur GI/Abd soft, nontender,  nondistended, bowel sound present CNS: Alert, awake, oriented x 3 Psychiatry: Mood appropriate Extremities: No pedal edema, no calf tenderness  Data Review: I have personally reviewed the laboratory data and studies available.  F/u labs ordered Unresulted Labs (From admission, onward)     Start     Ordered   07/22/22 0500  CBC  Daily,   R      07/20/22 1535   07/22/22 0500  Basic metabolic panel  Daily,   R      07/21/22 0906   07/21/22 0500  Protime-INR  Daily,   R      07/20/22 1533   07/20/22 1013  Remove and replace urinary cath (placed > 5 days) then obtain urine culture from new indwelling urinary catheter.  (Undifferentiated presentation (screening labs and basic nursing orders))  Once,   URGENT       Question:  Indication  Answer:  Flank Pain   07/20/22 1013            Total time spent in review of labs and imaging, patient evaluation, formulation of plan, documentation and communication with family: 55 minutes  Signed, Lorin Glass, MD [facility] 07/21/2022

## 2022-07-21 NOTE — Progress Notes (Signed)
Mobility Specialist Progress Note   07/21/22 1714  Mobility  Activity Stood at bedside (+ seated level exercises)  Level of Assistance Contact guard assist, steadying assist  Assistive Device Front wheel walker  Distance Ambulated (ft) 2 ft  Activity Response Tolerated well  Mobility Referral Yes  $Mobility charge 1 Mobility  Mobility Specialist Start Time (ACUTE ONLY) 1640  Mobility Specialist Stop Time (ACUTE ONLY) 1715  Mobility Specialist Time Calculation (min) (ACUTE ONLY) 35 min   Orthostatic BPs Sitting 114/54  Standing 87/44  Post-Mobility 112/64    Received pt in chair c/o chronic bilat. hip pain but agreeable to mobility. MinG to stand but pt immediately becoming dizzy, BP taken and pt hypotensive. Sat pt down to tend to dizziness and after ~7mins dizziness settled a little but still present. Settled for seated level exercises this session. Pt tolerated exercises very well while demonstrating strength w/ moderate activity tolerance, pt expressed no complaints. Left pt in chair w/ a retaken BP and VSS. Call bell placed in reach and chair alarm on.        Frederico Hamman Mobility Specialist Please contact via SecureChat or  Rehab office at [phone]

## 2022-07-21 NOTE — Progress Notes (Signed)
Hypoglycemic Event  CBG: 62  Treatment: 8 oz juice/soda  Symptoms: None  Follow-up CBG: Time 2210 CBG Result: 96  Possible Reasons for Event: Unknown  Comments/MD notified: None    Margarett Viti  Jomarie Longs

## 2022-07-21 NOTE — Care Management Obs Status (Signed)
MEDICARE OBSERVATION STATUS NOTIFICATION   Patient Details  Name: [name] [name] MRN: [id number] Date of Birth: [birth year]   Medicare Observation Status Notification Given:  Yes    Tom-Johnson, Hershal Coria, RN 07/21/2022, 2:02 PM

## 2022-07-21 NOTE — Progress Notes (Signed)
ANTICOAGULATION CONSULT NOTE Pharmacy Consult for warfarin Indication: atrial fibrillation  Allergies  Allergen Reactions   Procaine Hcl Anaphylaxis   Amoxicillin Itching    Patient Measurements: Height: 5\' 10"  (177.8 cm) Weight: 72.9 kg (160 lb 11.5 oz) IBW/kg (Calculated) : 68.5  Vital Signs: Temp: 98 F (36.7 C) (05/21 0812) Temp Source: Oral (05/21 0812) BP: 121/53 (05/21 0812) Pulse Rate: 79 (05/21 0812)  Labs: Recent Labs    07/20/22 1011 07/20/22 1100 07/20/22 1101 07/20/22 1310 07/21/22 0437  HGB 10.9* 13.6 14.3  --  9.3*  HCT 33.7* 40.0 42.0  --  28.3*  PLT 569*  --   --   --  519*  LABPROT 27.8*  --   --   --  32.6*  INR 2.6*  --   --   --  3.1*  CREATININE 2.82* 2.90*  --   --  2.27*  TROPONINIHS 14  --   --  13  --      Estimated Creatinine Clearance: 21.4 mL/min (A) (by C-G formula based on SCr of 2.27 mg/dL (H)).   Medications:  Patient's home warfarin regimen is 1.25 mg tablets daily. Her last dose was 5/19.   Assessment: Patient presents to the ED after a fall. She has been weak lately with poor intake and nausea and vomiting. She was recently found to have a UTI and was on bactrim. UTI cultures now indicate ESBL. INR on admission 2.6  INR trended up to 3.1 today   Goal of Therapy:  INR 2-3 Monitor platelets by anticoagulation protocol: Yes   Plan:  Give Warfarin 0.5 mg po x 1 dose today Follow up CBC and daily INR   Thank you Okey Regal, PharmD

## 2022-07-21 NOTE — Evaluation (Signed)
Physical Therapy Evaluation Patient Details Name: [name] [name] MRN: [id number] DOB: October 04, 1940 Today's Date: 07/21/2022  History of Present Illness  Pt is an 82 y/o female who presents s/p syncope and fall while walking to the car as family was attempting to bring her to the ED for vomiting, weakness. Of note, pt with recent admission with d/c 07/10/22 from [facility] for UTI and hematura with elevated INR (discharged with foley). PMH significant for A fib, L eye blindness, DM II, HTN, osteopenia, pelvic fracture 2018, seizures, systolic CHF, ankle fx surgery 2017, IM nail R in 2014 and L 2017, I&D hip 2017, B shoulder surgery.   Clinical Impression  Pt admitted with above diagnosis. Pt currently with functional limitations due to the deficits listed below (see PT Problem List). At the time of PT eval pt was able to perform transfers and ambulation with gross min guard assist and RW for support. Pt reports being sore in LE's from fall yesterday, however does not identify specific area of pain, just diffuse throughout LE's and when taking steps to the chair and with palpation. Pt reports she is near her baseline and is "annoyed" that she is not going home today. Given recent admission and syncopal episode, recommend short term post-acute follow up PT to monitor for functional progression at d/c. Acutely, pt will benefit from acute skilled PT to increase their independence and safety with mobility to allow discharge.          Recommendations for follow up therapy are one component of a multi-disciplinary discharge planning process, led by the attending physician.  Recommendations may be updated based on patient status, additional functional criteria and insurance authorization.  Follow Up Recommendations       Assistance Recommended at Discharge PRN  Patient can return home with the following  A little help with walking and/or transfers;A little help with bathing/dressing/bathroom;Assistance with  cooking/housework;Assist for transportation;Help with stairs or ramp for entrance    Equipment Recommendations None recommended by PT  Recommendations for Other Services       Functional Status Assessment Patient has had a recent decline in their functional status and demonstrates the ability to make significant improvements in function in a reasonable and predictable amount of time.     Precautions / Restrictions Precautions Precautions: Fall Restrictions Weight Bearing Restrictions: No      Mobility  Bed Mobility Overal bed mobility: Modified Independent Bed Mobility: Supine to Sit           General bed mobility comments: Increased time, no assist required.    Transfers Overall transfer level: Needs assistance Equipment used: Rolling walker (2 wheels) Transfers: Sit to/from Stand, Bed to chair/wheelchair/BSC Sit to Stand: Min guard   Step pivot transfers: Min guard       General transfer comment: Increased time to power up to full stand. No assist required. VC's for hand placement on seated surface for safety.    Ambulation/Gait               General Gait Details: Pt only willing to transfer bed>chair at this time.  Stairs            Wheelchair Mobility    Modified Rankin (Stroke Patients Only)       Balance Overall balance assessment: Needs assistance Sitting-balance support: Feet supported, No upper extremity supported Sitting balance-Leahy Scale: Fair     Standing balance support: Bilateral upper extremity supported, During functional activity, Reliant on assistive device for balance Standing balance-Leahy Scale:  Poor                               Pertinent Vitals/Pain Pain Assessment Pain Assessment: Faces Faces Pain Scale: Hurts little more Pain Location: LE's - pt reports general soreness and tender due to fall Pain Descriptors / Indicators: Tender, Sore Pain Intervention(s): Limited activity within patient's  tolerance, Monitored during session, Repositioned    Home Living Family/patient expects to be discharged to:: Private residence Living Arrangements: Spouse/significant other Available Help at Discharge: Family;Available 24 hours/day Type of Home: House Home Access: Stairs to enter Entrance Stairs-Rails: None Entrance Stairs-Number of Steps: 1 Alternate Level Stairs-Number of Steps: flight (has a stair lift) Home Layout: Two level Home Equipment: Rollator (4 wheels);Shower seat - built in;Grab bars - tub/shower      Prior Function Prior Level of Function : Independent/Modified Independent;History of Falls (last six months)             Mobility Comments: Rollator at baseline       Hand Dominance   Dominant Hand: Right    Extremity/Trunk Assessment   Upper Extremity Assessment Upper Extremity Assessment: Defer to OT evaluation    Lower Extremity Assessment Lower Extremity Assessment: Generalized weakness (Sore from fall but no specific point of pain reported)    Cervical / Trunk Assessment Cervical / Trunk Assessment: Other exceptions Cervical / Trunk Exceptions: Forward head posture with rounded shoulders  Communication   Communication: No difficulties  Cognition Arousal/Alertness: Awake/alert Behavior During Therapy: WFL for tasks assessed/performed Overall Cognitive Status: Within Functional Limits for tasks assessed                                          General Comments      Exercises     Assessment/Plan    PT Assessment Patient needs continued PT services  PT Problem List Decreased strength;Decreased activity tolerance;Decreased balance;Decreased mobility;Decreased knowledge of use of DME;Decreased safety awareness;Decreased knowledge of precautions;Pain       PT Treatment Interventions DME instruction;Gait training;Functional mobility training;Therapeutic activities;Therapeutic exercise;Balance training;Patient/family education     PT Goals (Current goals can be found in the Care Plan section)  Acute Rehab PT Goals Patient Stated Goal: Return home ASAP PT Goal Formulation: With patient Time For Goal Achievement: 07/28/22 Potential to Achieve Goals: Good    Frequency Min 3X/week     Co-evaluation               AM-PAC PT "6 Clicks" Mobility  Outcome Measure Help needed turning from your back to your side while in a flat bed without using bedrails?: A Little Help needed moving from lying on your back to sitting on the side of a flat bed without using bedrails?: A Little Help needed moving to and from a bed to a chair (including a wheelchair)?: A Little Help needed standing up from a chair using your arms (e.g., wheelchair or bedside chair)?: A Little Help needed to walk in hospital room?: A Little Help needed climbing 3-5 steps with a railing? : A Little 6 Click Score: 18    End of Session Equipment Utilized During Treatment: Gait belt Activity Tolerance: Patient tolerated treatment well Patient left: in chair;with call bell/phone within reach;with chair alarm set Nurse Communication: Mobility status PT Visit Diagnosis: Unsteadiness on feet (R26.81);Pain Pain - Right/Left:  (bilateral) Pain -  part of body: Hip;Knee;Leg    Time: 4098-1191 PT Time Calculation (min) (ACUTE ONLY): 20 min   Charges:   PT Evaluation $PT Eval Moderate Complexity: 1 Mod          Conni Slipper, PT, DPT Acute Rehabilitation Services Secure Chat Preferred Office: [phone]   Marylynn Pearson 07/21/2022, 1:33 PM

## 2022-07-22 DIAGNOSIS — N179 Acute kidney failure, unspecified: Secondary | ICD-10-CM | POA: Diagnosis not present

## 2022-07-22 LAB — PROTIME-INR
INR: 3 — ABNORMAL HIGH (ref 0.8–1.2)
Prothrombin Time: 31.2 seconds — ABNORMAL HIGH (ref 11.4–15.2)

## 2022-07-22 LAB — CBC
HCT: 28 % — ABNORMAL LOW (ref 36.0–46.0)
Hemoglobin: 9 g/dL — ABNORMAL LOW (ref 12.0–15.0)
MCH: 29.5 pg (ref 26.0–34.0)
MCHC: 32.1 g/dL (ref 30.0–36.0)
MCV: 91.8 fL (ref 80.0–100.0)
Platelets: 547 10*3/uL — ABNORMAL HIGH (ref 150–400)
RBC: 3.05 MIL/uL — ABNORMAL LOW (ref 3.87–5.11)
RDW: 13.1 % (ref 11.5–15.5)
WBC: 9.5 10*3/uL (ref 4.0–10.5)
nRBC: 0 % (ref 0.0–0.2)

## 2022-07-22 LAB — URINE CULTURE: Culture: 80000 — AB

## 2022-07-22 LAB — CULTURE, BLOOD (ROUTINE X 2)

## 2022-07-22 LAB — BASIC METABOLIC PANEL
Anion gap: 6 (ref 5–15)
BUN: 28 mg/dL — ABNORMAL HIGH (ref 8–23)
CO2: 23 mmol/L (ref 22–32)
Calcium: 7.7 mg/dL — ABNORMAL LOW (ref 8.9–10.3)
Chloride: 104 mmol/L (ref 98–111)
Creatinine, Ser: 2.02 mg/dL — ABNORMAL HIGH (ref 0.44–1.00)
GFR, Estimated: 24 mL/min — ABNORMAL LOW (ref >60)
Glucose, Bld: 68 mg/dL — ABNORMAL LOW (ref 70–99)
Potassium: 4.4 mmol/L (ref 3.5–5.1)
Sodium: 133 mmol/L — ABNORMAL LOW (ref 135–145)

## 2022-07-22 LAB — GLUCOSE, CAPILLARY
Glucose-Capillary: 63 mg/dL — ABNORMAL LOW (ref 70–99)
Glucose-Capillary: 131 mg/dL — ABNORMAL HIGH (ref 70–99)
Glucose-Capillary: 153 mg/dL — ABNORMAL HIGH (ref 70–99)
Glucose-Capillary: 158 mg/dL — ABNORMAL HIGH (ref 70–99)
Glucose-Capillary: 209 mg/dL — ABNORMAL HIGH (ref 70–99)

## 2022-07-22 MED ORDER — WARFARIN 0.5 MG HALF TABLET
0.5000 mg | ORAL_TABLET | Freq: Once | ORAL | Status: AC
  Administered 2022-07-22: 0.5 mg via ORAL
  Filled 2022-07-22: qty 1

## 2022-07-22 MED ORDER — INSULIN ASPART PROT & ASPART (70-30 MIX) 100 UNIT/ML ~~LOC~~ SUSP
5.0000 [IU] | Freq: Two times a day (BID) | SUBCUTANEOUS | Status: DC
  Administered 2022-07-22 – 2022-07-23 (×2): 5 [IU] via SUBCUTANEOUS
  Filled 2022-07-22: qty 10

## 2022-07-22 NOTE — Progress Notes (Signed)
PT Cancellation Note  Patient Details Name: [name] [name] MRN: [id number] DOB: Mar 14, 1940   Cancelled Treatment:    Reason Eval/Treat Not Completed: Patient declined, no reason specified. Pt states she is "very unhappy" at this time as she expected to d/c today and was told it will be tomorrow. Declines PT at this time. Will check back as schedule allows.    Marylynn Pearson 07/22/2022, 11:51 AM  Conni Slipper, PT, DPT Acute Rehabilitation Services Secure Chat Preferred Office: [phone]

## 2022-07-22 NOTE — Progress Notes (Signed)
ANTICOAGULATION CONSULT NOTE Pharmacy Consult for warfarin Indication: atrial fibrillation  Allergies  Allergen Reactions   Procaine Hcl Anaphylaxis   Amoxicillin Itching    Patient Measurements: Height: 5\' 10"  (177.8 cm) Weight: 72.9 kg (160 lb 11.5 oz) IBW/kg (Calculated) : 68.5  Vital Signs: Temp: 98.7 F (37.1 C) (05/22 0816) Temp Source: Oral (05/22 0816) BP: 131/51 (05/22 0816) Pulse Rate: 67 (05/22 0816)  Labs: Recent Labs    07/20/22 1011 07/20/22 1100 07/20/22 1101 07/20/22 1310 07/21/22 0437 07/22/22 0216  HGB 10.9* 13.6 14.3  --  9.3* 9.0*  HCT 33.7* 40.0 42.0  --  28.3* 28.0*  PLT 569*  --   --   --  519* 547*  LABPROT 27.8*  --   --   --  32.6* 31.2*  INR 2.6*  --   --   --  3.1* 3.0*  CREATININE 2.82* 2.90*  --   --  2.27* 2.02*  TROPONINIHS 14  --   --  13  --   --      Estimated Creatinine Clearance: 24 mL/min (A) (by C-G formula based on SCr of 2.02 mg/dL (H)).   Medications:  Patient's home warfarin regimen is 1.25 mg tablets daily. Her last dose was 5/19.   Assessment: Patient presents to the ED after a fall. She has been weak lately with poor intake and nausea and vomiting. She was recently found to have a UTI and was on bactrim. UTI cultures now indicate ESBL. INR on admission 2.6  INR 3 today   Goal of Therapy:  INR 2-3 Monitor platelets by anticoagulation protocol: Yes   Plan:  Repeat  Warfarin 0.5 mg po x 1 dose today Follow up CBC and daily INR   Thank you Okey Regal, PharmD

## 2022-07-22 NOTE — Progress Notes (Signed)
PROGRESS NOTE  [name] [name]  DOB: 1940-09-03  PCP: [name], MD ZOX:[id number]  DOA: 07/20/2022  LOS: 1 day  Hospital Day: 3  Brief narrative: [name] [name] is a 82 y.o. female with PMH significant for DM2, HTN, HLD, A-fib on Coumadin, CAD, ischemic cardiomyopathy, systolic CHF, CKD, GERD, seizures, ESBL UTI Recently hospitalized 5/7 to 5/10 for hematuria in the setting of elevated INR to 7.9, treated with continuous bladder irrigation, also treated for ESBL Klebsiella with IV meropenem and discharged home on oral Bactrim and with Foley catheter in situ.[name] Post discharge, patient continued to have generalized weakness, poor oral intake, missed appointments. 5/20, patient also threw up and looked altered.   Husband called PCP who directed her to ED.   While walking with a walker to the car, patient fell to the grass.  Husband assisted her back to the chair but she had approximately 22nd episode of syncope.  Upon waking up, she was alert and oriented but continued to feel unwell.  She also had pain in her pelvis and both thighs.  In the ED, patient was afebrile, borderline tachycardic, blood pressure in normal range Labs showed WBC count elevated to 13.7, hemoglobin low at 10.9, INR elevated to 2.6, glucose at 300, sodium 131, BUN 37/2.82 against creatinine 1.4 on discharge 2 weeks ago, BNP more than 1100 Urinalysis showed hazy yellow urine with moderate hemoglobin, large leukocytes, many bacteria Blood culture and urine culture were collected Respiratory virus panel unremarkable Right femur x-ray did not show any evidence of fracture CT head did not show any acute intracranial abnormality.  It showed chronic left MCA infarct and small vessel ischemic change CT cervical spine did not show  cervical spine fracture/subluxation.  It showed cervical degenerative disc disease. CT chest abdomen pelvis did not show any evidence of acute traumatic injury or acute intra-abdominal process.   It showed a small nonobstructing left renal calculus with mild left hydroureteronephrosis, 8 mm calculus in the urinary bladder Patient was started on IV fluid Admitted to [facility]  Subjective: Patient was seen and examined this morning.  Lying on bed.  Not in distress.  No new symptoms.  Foley catheter removed this morning.  Pending voiding trial. Patient patient had multiple episodes of hypoglycemic episodes to 50-60 overnight without symptoms though. Labs from this morning with creatinine improving to 2.02  Assessment and plan: AKI on CKD stage IV Baseline creatinine 1.41 from 07/10/2022  Present with creatinine elevated to 2.9 due to volume contraction from poor oral intake.  Gradually improving creatinine with IV fluid. Lasix on hold. Recent Labs    05/05/22 1231 06/30/22 0024 07/07/22 1637 07/08/22 8119 07/09/22 0348 07/10/22 0343 07/20/22 1011 07/20/22 1100 07/21/22 0437 07/22/22 0216  BUN 33* 31* 29* 29* 29* 23 37* 37* 33* 28*  CREATININE 1.88* 1.68* 1.83* 1.74* 1.66* 1.41* 2.82* 2.90* 2.27* 2.02*   Recent history of hematuria She was discharged with Foley catheter last admission.  Foley was changed in ED.  This time, imaging showed mild left hydroureteronephrosis and 8 mm nonobstructing bladder stone.   Urinalysis showed moderate amount of hemoglobin but no gross hematuria noted in urine in Foley. It seems admitting physician discussed on-call Dr. Urology Dr. Annabell Howells, who recommended voiding trial in the hospital.  Foley catheter removed this morning.  Continue to monitor.  Recent ESBL UTI Completed course of Bactrim 2 days ago. Urinalysis showed leukocytes and bacteria but no clear evidence of infection.  It seems that the admitting physician  discussed with on-call ID Dr. Earlene Plater who suggested to hold off antibiotics for now. Continue to monitor Recent Labs  Lab 07/20/22 1011 07/20/22 1013 07/21/22 0437 07/22/22 0216  WBC 13.7*  --  12.0* 9.5  LATICACIDVEN  --  1.6   --   --    Chronic systolic CHF Ischemic cardiomyopathy Essential hypertension PTA on Toprol 50 mg daily, Lasix 40 mg twice daily Continue metoprolol.  Lasix held because of AKI.   Type 2 diabetes mellitus uncontrolled HypErglycemia/hypoglycemia A1c 9 on 05/05/2022 PTA on ReliOn 70/30 insulin 26 units a.m., 2 units p.m. Initial glucose level was elevated as high as 280s.  Patient was started on Semglee and moderate sliding scale insulin. Noted that last night, patient had low blood sugar episodes down to 50s. I would switch insulin plan to her home regimen of 70/30 but at reduced dose of 5 units twice daily for this morning.  Continue to monitor. Recent Labs  Lab 07/21/22 2116 07/21/22 2210 07/22/22 0708 07/22/22 0832 07/22/22 1113  GLUCAP 59* 96 63* 131* 153*   CAD/HLD Continue Coumadin and Zetia 10 mg daily  Paroxysmal A-fib PTA on metoprolol and Coumadin Continue both. INR trend as below.  Target INR 2-3.  Pharmacy managing Recent Labs  Lab 07/20/22 1011 07/21/22 0437 07/22/22 0216  INR 2.6* 3.1* 3.0*   Chronic anemia  GERD Continue PPI.  Hemoglobin baseline close to 10.  Was hemoconcentrated on admission.   With IV hydration, hemoglobin is now down close to baseline. Recent Labs    07/20/22 1011 07/20/22 1100 07/20/22 1101 07/21/22 0437 07/22/22 0216  HGB 10.9* 13.6 14.3 9.3* 9.0*  MCV 90.8  --   --  90.7 91.8   Cholelithiasis without cholecystitis No symptoms.  No surgical need at this time.  Impaired mobility Physical deconditioning Old L1 compression fracture Old proximal femur fractures s/p prior fixations Complains of right hip pain but x-ray negative for fracture. For pain management, patient is currently on as needed Tylenol only. PT eval obtained.  Home with PT recommended.   Goals of care   Code Status: Full Code     DVT prophylaxis:   warfarin (COUMADIN) tablet 0.5 mg   Antimicrobials: Currently none Fluid: NS at 75 mill per  hour Consultants: None Family Communication: None at bedside  Status: Inpatient Level of care:  Med-Surg   Patient from: Home Anticipated d/c to: Hopefully home with home health tomorrow Needs to continue in-hospital care:  Needs IV hydration,  renal function monitoring.    Diet:  Diet Order             Diet heart healthy/carb modified Room service appropriate? Yes; Fluid consistency: Thin  Diet effective now                   Scheduled Meds:  Chlorhexidine Gluconate Cloth  6 each Topical Daily   ezetimibe  10 mg Oral Daily   insulin aspart  0-15 Units Subcutaneous TID WC   insulin aspart  0-5 Units Subcutaneous QHS   insulin aspart protamine- aspart  5 Units Subcutaneous BID WC   metoprolol succinate  50 mg Oral Daily   warfarin  0.5 mg Oral ONCE-1600   Warfarin - Pharmacist Dosing Inpatient   Does not apply q1600    PRN meds: acetaminophen, hydrALAZINE, ondansetron (ZOFRAN) IV   Infusions:   sodium chloride 75 mL/hr at 07/21/22 1150    Antimicrobials: Anti-infectives (From admission, onward)    Start  Dose/Rate Route Frequency Ordered Stop   07/20/22 1445  piperacillin-tazobactam (ZOSYN) IVPB 3.375 g  Status:  Discontinued        3.375 g 100 mL/hr over 30 Minutes Intravenous  Once 07/20/22 1442 07/20/22 1457       Nutritional status:  Body mass index is 23.06 kg/m.          Objective: Vitals:   07/21/22 2140 07/22/22 0816  BP: (!) 125/92 (!) 131/51  Pulse: 99 67  Resp: 17 16  Temp: (!) 97.5 F (36.4 C) 98.7 F (37.1 C)  SpO2: 97% 97%    Intake/Output Summary (Last 24 hours) at 07/22/2022 1326 Last data filed at 07/22/2022 1914 Gross per 24 hour  Intake 1811.17 ml  Output 1050 ml  Net 761.17 ml   Filed Weights   07/20/22 1552  Weight: 72.9 kg   Weight change:  Body mass index is 23.06 kg/m.   Physical Exam: General exam: Pleasant, elderly Caucasian female.  Not in physical distress.   Skin: No rashes, lesions or  ulcers. HEENT: Atraumatic, normocephalic, no obvious bleeding Lungs: Clear to auscultation bilaterally CVS: Regular rate and rhythm, no murmur GI/Abd soft, nontender, nondistended, bowel sound present CNS: Alert, awake, oriented x 3 Psychiatry: Frustrated because of not being able to go home today Extremities: No pedal edema, no calf tenderness  Data Review: I have personally reviewed the laboratory data and studies available.  F/u labs ordered Unresulted Labs (From admission, onward)     Start     Ordered   07/22/22 0500  CBC  Daily,   R      07/20/22 1535   07/22/22 0500  Basic metabolic panel  Daily,   R      07/21/22 0906   07/21/22 0500  Protime-INR  Daily,   R      07/20/22 1533            Total time spent in review of labs and imaging, patient evaluation, formulation of plan, documentation and communication with family: 45 minutes  Signed, Lorin Glass, MD [facility] 07/22/2022

## 2022-07-23 DIAGNOSIS — N179 Acute kidney failure, unspecified: Secondary | ICD-10-CM | POA: Diagnosis not present

## 2022-07-23 LAB — BASIC METABOLIC PANEL
Anion gap: 7 (ref 5–15)
BUN: 24 mg/dL — ABNORMAL HIGH (ref 8–23)
CO2: 22 mmol/L (ref 22–32)
Calcium: 7.7 mg/dL — ABNORMAL LOW (ref 8.9–10.3)
Chloride: 103 mmol/L (ref 98–111)
Creatinine, Ser: 1.96 mg/dL — ABNORMAL HIGH (ref 0.44–1.00)
GFR, Estimated: 25 mL/min — ABNORMAL LOW (ref >60)
Glucose, Bld: 182 mg/dL — ABNORMAL HIGH (ref 70–99)
Potassium: 4.9 mmol/L (ref 3.5–5.1)
Sodium: 132 mmol/L — ABNORMAL LOW (ref 135–145)

## 2022-07-23 LAB — CBC
HCT: 28.3 % — ABNORMAL LOW (ref 36.0–46.0)
Hemoglobin: 9 g/dL — ABNORMAL LOW (ref 12.0–15.0)
MCH: 29.6 pg (ref 26.0–34.0)
MCHC: 31.8 g/dL (ref 30.0–36.0)
MCV: 93.1 fL (ref 80.0–100.0)
Platelets: 546 10*3/uL — ABNORMAL HIGH (ref 150–400)
RBC: 3.04 MIL/uL — ABNORMAL LOW (ref 3.87–5.11)
RDW: 13 % (ref 11.5–15.5)
WBC: 10 10*3/uL (ref 4.0–10.5)
nRBC: 0 % (ref 0.0–0.2)

## 2022-07-23 LAB — GLUCOSE, CAPILLARY
Glucose-Capillary: 142 mg/dL — ABNORMAL HIGH (ref 70–99)
Glucose-Capillary: 205 mg/dL — ABNORMAL HIGH (ref 70–99)

## 2022-07-23 LAB — PROTIME-INR
INR: 2.8 — ABNORMAL HIGH (ref 0.8–1.2)
Prothrombin Time: 29.7 seconds — ABNORMAL HIGH (ref 11.4–15.2)

## 2022-07-23 LAB — CULTURE, BLOOD (ROUTINE X 2): Special Requests: ADEQUATE

## 2022-07-23 MED ORDER — NOVOLIN 70/30 RELION (70-30) 100 UNIT/ML ~~LOC~~ SUSP: SUBCUTANEOUS | Status: DC

## 2022-07-23 MED ORDER — FUROSEMIDE 40 MG PO TABS: 40.0000 mg | ORAL_TABLET | Freq: Every day | ORAL | Status: DC | PRN

## 2022-07-23 MED ORDER — WARFARIN 1.25 MG HALF TABLET
1.2500 mg | ORAL_TABLET | Freq: Every day | ORAL | Status: DC
  Filled 2022-07-23: qty 1

## 2022-07-23 NOTE — Progress Notes (Signed)
Physical Therapy Treatment Patient Details Name: [name] [name] MRN: [id number] DOB: 1940-12-08 Today's Date: 07/23/2022   History of Present Illness Pt is an 82 y/o female who presents s/p syncope and fall while walking to the car as family was attempting to bring her to the ED for vomiting, weakness. Of note, pt with recent admission with d/c 07/10/22 from [facility] for UTI and hematura with elevated INR (discharged with foley). PMH significant for A fib, L eye blindness, DM II, HTN, osteopenia, pelvic fracture 2018, seizures, systolic CHF, ankle fx surgery 2017, IM nail R in 2014 and L 2017, I&D hip 2017, B shoulder surgery.    PT Comments    Pt greeted resting in bed and agreeable to session with continued progress towards acute goals. Pt requiring grossly min guard assist for bed mobility, transfers and gait with RW support, with cues throughout for posture and RW management. Pt VSS throughout session and pt without dizziness/lightheadedness. Pt continues to be limited by bil LE pain, decreased activity tolerance, and impaired balance/postural reactions. Current plan remains appropriate to address deficits and maximize functional independence and decrease caregiver burden. Pt continues to benefit from skilled PT services to progress toward functional mobility goals.    Recommendations for follow up therapy are one component of a multi-disciplinary discharge planning process, led by the attending physician.  Recommendations may be updated based on patient status, additional functional criteria and insurance authorization.  Follow Up Recommendations       Assistance Recommended at Discharge PRN  Patient can return home with the following A little help with walking and/or transfers;A little help with bathing/dressing/bathroom;Assistance with cooking/housework;Assist for transportation;Help with stairs or ramp for entrance   Equipment Recommendations  None recommended by PT    Recommendations  for Other Services       Precautions / Restrictions Precautions Precautions: Fall Restrictions Weight Bearing Restrictions: No     Mobility  Bed Mobility Overal bed mobility: Modified Independent Bed Mobility: Supine to Sit           General bed mobility comments: Increased time, no assist required.    Transfers Overall transfer level: Needs assistance Equipment used: Rolling walker (2 wheels) Transfers: Sit to/from Stand, Bed to chair/wheelchair/BSC Sit to Stand: Min guard           General transfer comment: Increased time to power up to full stand. No assist required.    Ambulation/Gait Ambulation/Gait assistance: Min guard Gait Distance (Feet): 70 Feet Assistive device: Rolling walker (2 wheels) Gait Pattern/deviations: Step-through pattern, Decreased stride length, Antalgic Gait velocity: decr     General Gait Details: antalgic like gait as pt reporting pain in bil LEs, cues for posture   Stairs             Wheelchair Mobility    Modified Rankin (Stroke Patients Only)       Balance Overall balance assessment: Needs assistance Sitting-balance support: Feet supported, No upper extremity supported Sitting balance-Leahy Scale: Fair     Standing balance support: Bilateral upper extremity supported, During functional activity, Reliant on assistive device for balance Standing balance-Leahy Scale: Poor Standing balance comment: reliant on UE support                            Cognition Arousal/Alertness: Awake/alert Behavior During Therapy: WFL for tasks assessed/performed Overall Cognitive Status: Within Functional Limits for tasks assessed  Exercises      General Comments        Pertinent Vitals/Pain Pain Assessment Pain Assessment: Faces Faces Pain Scale: Hurts little more Pain Location: LE's - pt reports general soreness Pain Descriptors / Indicators:  Sore Pain Intervention(s): Monitored during session, Limited activity within patient's tolerance, Repositioned    Home Living                          Prior Function            PT Goals (current goals can now be found in the care plan section) Acute Rehab PT Goals PT Goal Formulation: With patient Time For Goal Achievement: 07/28/22 Progress towards PT goals: Progressing toward goals    Frequency    Min 3X/week      PT Plan      Co-evaluation              AM-PAC PT "6 Clicks" Mobility   Outcome Measure  Help needed turning from your back to your side while in a flat bed without using bedrails?: A Little Help needed moving from lying on your back to sitting on the side of a flat bed without using bedrails?: A Little Help needed moving to and from a bed to a chair (including a wheelchair)?: A Little Help needed standing up from a chair using your arms (e.g., wheelchair or bedside chair)?: A Little Help needed to walk in hospital room?: A Little Help needed climbing 3-5 steps with a railing? : A Little 6 Click Score: 18    End of Session Equipment Utilized During Treatment: Gait belt Activity Tolerance: Patient tolerated treatment well Patient left: in chair;with call bell/phone within reach;with chair alarm set Nurse Communication: Mobility status PT Visit Diagnosis: Unsteadiness on feet (R26.81);Pain Pain - Right/Left:  (bilateral) Pain - part of body: Hip;Knee;Leg     Time: 1005-1018 PT Time Calculation (min) (ACUTE ONLY): 13 min  Charges:  $Gait Training: 8-22 mins                     Nikyla Navedo R. PTA Acute Rehabilitation Services Office: [phone]   Catalina Antigua 07/23/2022, 10:22 AM

## 2022-07-23 NOTE — Consult Note (Signed)
   [facility] CM Inpatient Consult   07/23/2022  [name] 1940/03/03 [id number]  [facility] [[facility]]  Accountable Care Organization [ACO] Patient: Medicare ACO REACH  *[facility] RN Hospital Liaison remote coverage review for patient admitted to [facility]   Primary Care Provider:  [name], MD with [facility] which is listed to provide the transition of care follow up  Call patient at bedside phone without success to follow up on post hospital needs for readmission prevention due to potential need for urology follow up needs. Call also placed to phone number listed in electronic medical record requesting a return call.     Patient screened for less than 30 days hospitalization with noted medium risk score for unplanned readmission medium risk and  to assess for potential [facility] [employer]  [[facility]] Care Management service needs for post hospital transition for care coordination.  Review of patient's electronic medical record reveals patient is for home today.   Plan:  Await return call  Referral request for community care coordination: anticipate [facility] TOC RN to cover appointment needs  Of note, [facility] Care Management/Population Health does not replace or interfere with any arrangements made by the Inpatient Transition of Care team.  For questions contact:   Charlesetta Shanks, RN BSN CCM [facility]  [phone] business mobile phone Toll free office [phone]  *Concierge Line  [phone] [email or web address] number: [phone] [email or web address]

## 2022-07-23 NOTE — Discharge Summary (Signed)
Physician Discharge Summary  [name] [name]:[id number] DOB: 23-Dec-1940 DOA: 07/20/2022  PCP: [name], MD  Admit date: 07/20/2022 Discharge date: 07/24/2022  Admitted From: Home Discharge disposition: Home with home health PT  Recommendations at discharge:  Lasix dose has been reduced to daily as needed only.  Monitor your fluid balance based on shortness of breath, pedal edema and oral intake.  Needs further assessment as an outpatient You been requiring less insulin in the hospital than at home.  Based on your total insulin requirement, your insulin dose has been adjusted to 70/30 insulin 10 units in the morning and 5 units at night.   Brief narrative: [name] [name] is a 82 y.o. female with PMH significant for DM2, HTN, HLD, A-fib on Coumadin, CAD, ischemic cardiomyopathy, systolic CHF, CKD, GERD, seizures, ESBL UTI Recently hospitalized 5/7 to 5/10 for hematuria in the setting of elevated INR to 7.9, treated with continuous bladder irrigation, also treated for ESBL Klebsiella with IV meropenem and discharged home on oral Bactrim and with Foley catheter in situ.[name] Post discharge, patient continued to have generalized weakness, poor oral intake, missed appointments. 5/20, patient also threw up and looked altered.   Husband called PCP who directed her to ED.   While walking with a walker to the car, patient fell to the grass.  Husband assisted her back to the chair but she had approximately 22nd episode of syncope.  Upon waking up, she was alert and oriented but continued to feel unwell.  She also had pain in her pelvis and both thighs.  In the ED, patient was afebrile, borderline tachycardic, blood pressure in normal range Labs showed WBC count elevated to 13.7, hemoglobin low at 10.9, INR elevated to 2.6, glucose at 300, sodium 131, BUN 37/2.82 against creatinine 1.4 on discharge 2 weeks ago, BNP more than 1100 Urinalysis showed hazy yellow urine with moderate hemoglobin,  large leukocytes, many bacteria Blood culture and urine culture were collected Respiratory virus panel unremarkable Right femur x-ray did not show any evidence of fracture CT head did not show any acute intracranial abnormality.  It showed chronic left MCA infarct and small vessel ischemic change CT cervical spine did not show  cervical spine fracture/subluxation.  It showed cervical degenerative disc disease. CT chest abdomen pelvis did not show any evidence of acute traumatic injury or acute intra-abdominal process.  It showed a small nonobstructing left renal calculus with mild left hydroureteronephrosis, 8 mm calculus in the urinary bladder Patient was started on IV fluid Admitted to [facility]  Subjective: Patient was seen and examined this morning.  Sitting up in recliner.  Not in distress no new symptoms.   Foley catheter was pulled out yesterday.  Voiding trial successful. Excited about going home today.  Assessment and plan: AKI on CKD stage IV Baseline creatinine 1.41 from 07/10/2022  Present with creatinine elevated to 2.9 due to volume contraction from poor oral intake.  Gradually improving creatinine with IV fluid. Lasix on hold.  At discharge, I would recommend to resume Lasix only as as needed. Recent Labs    06/30/22 0024 07/07/22 1637 07/08/22 5956 07/09/22 0348 07/10/22 0343 07/20/22 1011 07/20/22 1100 07/21/22 0437 07/22/22 0216 07/23/22 0127  BUN 31* 29* 29* 29* 23 37* 37* 33* 28* 24*  CREATININE 1.68* 1.83* 1.74* 1.66* 1.41* 2.82* 2.90* 2.27* 2.02* 1.96*   Recent history of hematuria She was discharged with Foley catheter last admission.  Foley was changed in ED.  This time, imaging showed  mild left hydroureteronephrosis and 8 mm nonobstructing bladder stone.   Urinalysis showed moderate amount of hemoglobin but no gross hematuria noted in urine in Foley. It seems admitting physician discussed on-call Dr. Urology Dr. Annabell Howells, who recommended voiding trial in the  hospital.  5/22, Foley catheter was removed.  Voiding trial successful.  Recent ESBL UTI Completed course of Bactrim 2 days ago. Urinalysis showed leukocytes and bacteria but no clear evidence of infection.  It seems that the admitting physician discussed with on-call ID Dr. Earlene Plater who suggested to hold off antibiotics for now. Recent Labs  Lab 07/20/22 1011 07/20/22 1013 07/21/22 0437 07/22/22 0216 07/23/22 0127  WBC 13.7*  --  12.0* 9.5 10.0  LATICACIDVEN  --  1.6  --   --   --    Chronic systolic CHF Ischemic cardiomyopathy Essential hypertension Most recent echocardiogram from 2022 with EF 45 to 50%.   PTA on Toprol 50 mg daily, Lasix 40 mg twice daily Continue metoprolol.  Lasix was held.  I believe patient was over diuresed with Lasix 40 mg twice daily at home.  Remains euvolemic.  Received adequate hydration in the hospital.  At discharge, will resume Lasix 40 mg daily only as needed.  Type 2 diabetes mellitus uncontrolled HypErglycemia/hypoglycemia A1c 9 on 05/05/2022 PTA on ReliOn 70/30 insulin 26 units a.m., 2 units p.m. Here in the hospital, she has been requiring less total daily dose of 70/30 insulin. Based on total insulin requirement, insulin dose has been adjusted to 70/30 insulin 10 units in the morning and 5 units at night. Recent Labs  Lab 07/22/22 1113 07/22/22 1543 07/22/22 2044 07/23/22 0718 07/23/22 1125  GLUCAP 153* 158* 209* 142* 205*   CAD/HLD Continue Coumadin and Zetia 10 mg daily  Paroxysmal A-fib PTA on metoprolol and Coumadin Continue both. INR trend as below.  Target INR 2-3. Recent Labs  Lab 07/20/22 1011 07/21/22 0437 07/22/22 0216 07/23/22 0127  INR 2.6* 3.1* 3.0* 2.8*   Chronic anemia  GERD Continue PPI.  Hemoglobin baseline close to 10.  Was hemoconcentrated on admission.   With IV hydration, hemoglobin is now down close to baseline. Recent Labs    07/20/22 1100 07/20/22 1101 07/21/22 0437 07/22/22 0216 07/23/22 0127   HGB 13.6 14.3 9.3* 9.0* 9.0*  MCV  --   --  90.7 91.8 93.1   Cholelithiasis without cholecystitis No symptoms.  No surgical need at this time.  Impaired mobility Physical deconditioning Old L1 compression fracture Old proximal femur fractures s/p prior fixations Complains of right hip pain but x-ray negative for fracture. For pain management, patient is currently on as needed Tylenol only. PT eval obtained.  Home with PT recommended.   Goals of care   Code Status: Prior   Wounds:  -    Discharge Exam:   Vitals:   07/22/22 2041 07/23/22 0440 07/23/22 0443 07/23/22 0739  BP: (!) 141/68 (!) 152/67 (!) 140/57 (!) 159/66  Pulse: 67 (!) 58 63 63  Resp: 18  16 18   Temp: 98 F (36.7 C) 98.7 F (37.1 C)  (!) 97.5 F (36.4 C)  TempSrc: Oral Oral  Oral  SpO2: 94% 91% 92% 90%  Weight:      Height:        Body mass index is 23.06 kg/m.   General exam: Pleasant, elderly Caucasian female.  Not in physical distress.   Skin: No rashes, lesions or ulcers. HEENT: Atraumatic, normocephalic, no obvious bleeding Lungs: Clear to auscultation bilaterally CVS: Regular  rate and rhythm, no murmur GI/Abd soft, nontender, nondistended, bowel sound present CNS: Alert, awake, oriented x 3 Psychiatry: Mood appropriate Extremities: No pedal edema, no calf tenderness  Follow ups:    Follow-up Information     [name], MD Follow up.   Specialty: Internal Medicine Contact information: [address] [place] [address] [phone]         [facility], [facility] Follow up.   Specialty: Home Health Services Why: Someone will call you to schedule first home visit. If you have not received a call after two days of discharging home, call their number listed. If no one comes to assess, call Case Manager at [phone]. Contact information: [address] [place] [address] [phone]                 Discharge Instructions:    Discharge Instructions     Call MD for:  difficulty breathing, headache or visual disturbances   Complete by: As directed    Call MD for:  extreme fatigue   Complete by: As directed    Call MD for:  hives   Complete by: As directed    Call MD for:  persistant dizziness or light-headedness   Complete by: As directed    Call MD for:  persistant nausea and vomiting   Complete by: As directed    Call MD for:  severe uncontrolled pain   Complete by: As directed    Call MD for:  temperature >100.4   Complete by: As directed    Diet general   Complete by: As directed    Discharge instructions   Complete by: As directed    Recommendations at discharge:   Lasix dose has been reduced to daily as needed only.  Monitor your fluid balance based on shortness of breath, pedal edema and oral intake.  Needs further assessment as an outpatient  You been requiring less insulin in the hospital than at home.  Based on your total insulin requirement, your insulin dose has been adjusted to 70/30 insulin 10 units in the morning and 5 units at night.  Discharge instructions for diabetes mellitus: Check blood sugar 3 times a day and bedtime at home. If blood sugar running above 200 or less than 70 please call your MD to adjust insulin. If you notice signs and symptoms of hypoglycemia (low blood sugar) like jitteriness, confusion, thirst, tremor and sweating, please check blood sugar, drink sugary drink/biscuits/sweets to increase sugar level and call MD or return to ER.    General discharge instructions: Follow with Primary MD [name], MD in 7 days  Please request your PCP  to go over your hospital tests, procedures, radiology results at the follow up. Please get your medicines reviewed and adjusted.  Your PCP may decide to repeat certain labs or tests as needed. Do not drive, operate heavy machinery, perform activities at heights, swimming or participation in water activities or provide baby  sitting services if your were admitted for syncope or siezures until you have seen by Primary MD or a Neurologist and advised to do so again. [place] Controlled Substance Reporting System database was reviewed. Do not drive, operate heavy machinery, perform activities at heights, swim, participate in water activities or provide baby-sitting services while on medications for pain, sleep and mood until your outpatient physician has reevaluated you and advised to do so again.  You are strongly recommended to comply with the  dose, frequency and duration of prescribed medications. Activity: As tolerated with Full fall precautions use walker/cane & assistance as needed Avoid using any recreational substances like cigarette, tobacco, alcohol, or non-prescribed drug. If you experience worsening of your admission symptoms, develop shortness of breath, life threatening emergency, suicidal or homicidal thoughts you must seek medical attention immediately by calling 911 or calling your MD immediately  if symptoms less severe. You must read complete instructions/literature along with all the possible adverse reactions/side effects for all the medicines you take and that have been prescribed to you. Take any new medicine only after you have completely understood and accepted all the possible adverse reactions/side effects.  Wear Seat belts while driving. You were cared for by a hospitalist during your hospital stay. If you have any questions about your discharge medications or the care you received while you were in the hospital after you are discharged, you can call the unit and ask to speak with the hospitalist or the covering physician. Once you are discharged, your primary care physician will handle any further medical issues. Please note that NO REFILLS for any discharge medications will be authorized once you are discharged, as it is imperative that you return to your primary care physician (or establish a  relationship with a primary care physician if you do not have one).   Increase activity slowly   Complete by: As directed        Discharge Medications:   Allergies as of 07/23/2022       Reactions   Procaine Hcl Anaphylaxis   Amoxicillin Itching        Medication List     STOP taking these medications    sulfamethoxazole-trimethoprim 800-160 MG tablet Commonly known as: BACTRIM DS       TAKE these medications    acetaminophen 325 MG tablet Commonly known as: TYLENOL Take 325 mg by mouth daily at 6 (six) AM.   alendronate 35 MG tablet Commonly known as: FOSAMAX Take 35 mg by mouth once a week.   atorvastatin 80 MG tablet Commonly known as: LIPITOR Take 1 tablet by mouth once daily   diphenhydramine-acetaminophen 25-500 MG Tabs tablet Commonly known as: TYLENOL PM Take 1 tablet by mouth at bedtime as needed (Prn as needed.).   ezetimibe 10 MG tablet Commonly known as: ZETIA Take 1 tablet (10 mg total) by mouth daily.   furosemide 40 MG tablet Commonly known as: LASIX Take 1 tablet (40 mg total) by mouth daily as needed for fluid or edema. What changed: See the new instructions.   metoprolol succinate 50 MG 24 hr tablet Commonly known as: TOPROL-XL TAKE 1 TABLET BY MOUTH ONCE DAILY . APPOINTMENT REQUIRED FOR FUTURE REFILLS What changed: See the new instructions.   MULTIVITAMIN ADULT PO Take 1 tablet by mouth daily with breakfast.   NovoLIN 70/30 ReliOn (70-30) 100 UNIT/ML injection Generic drug: insulin NPH-regular Human 10 units in the morning and 5 units at night. What changed: additional instructions   PREVAGEN PO Take 1 tablet by mouth daily.   VITAMIN D (CHOLECALCIFEROL) PO Take 1 tablet by mouth daily.   warfarin 2.5 MG tablet Commonly known as: COUMADIN Take as directed. If you are unsure how to take this medication, talk to your nurse or doctor. Original instructions: Take 0.5 tablets (1.25 mg total) by mouth daily.         The  results of significant diagnostics from this hospitalization (including imaging, microbiology, ancillary and laboratory) are listed below for  reference.    Procedures and Diagnostic Studies:   DG Femur Min 2 Views Right  Result Date: 07/20/2022 CLINICAL DATA:  Pain after fall EXAM: RIGHT FEMUR 2 VIEWS COMPARISON:  07/15/2018 FINDINGS: Dynamic hip screw in place with long intramedullary rod. Single distal metaphyseal fixation screw along the rod. Underlying osteopenia. Healed fracture of the very proximal femur. Additional areas of injury along the right pubic bone are also stable. Degenerative changes of the knee joint and hip joint. Scattered vascular calcifications. IMPRESSION: Intramedullary rod with dynamic hip screw in place. Healed injury along the proximal femur and adjacent pelvis. Mild degenerative changes.  Scattered vascular calcifications. Electronically Signed   By: Karen Kays M.D.   On: 07/20/2022 13:18   CT CHEST ABDOMEN PELVIS WO CONTRAST  Result Date: 07/20/2022 CLINICAL DATA:  Fall. EXAM: CT CHEST, ABDOMEN AND PELVIS WITHOUT CONTRAST TECHNIQUE: Multidetector CT imaging of the chest, abdomen and pelvis was performed following the standard protocol without IV contrast. RADIATION DOSE REDUCTION: This exam was performed according to the departmental dose-optimization program which includes automated exposure control, adjustment of the mA and/or kV according to patient size and/or use of iterative reconstruction technique. COMPARISON:  Jul 07, 2022. FINDINGS: CT CHEST FINDINGS Cardiovascular: Atherosclerosis of thoracic aorta is noted without aneurysm or dissection. Normal cardiac size. No pericardial effusion. Coronary artery calcifications are noted. Mediastinum/Nodes: Small sliding-type hiatal hernia. No adenopathy. Thyroid gland is unremarkable. Lungs/Pleura: No pneumothorax or pleural effusion is noted. Minimal right middle lobe and lingular subsegmental atelectasis or scarring is noted.  4 mm right middle lobe nodule is noted on image 106 of series 5. Musculoskeletal: No chest wall mass or suspicious bone lesions identified. CT ABDOMEN PELVIS FINDINGS Hepatobiliary: Cholelithiasis. No biliary dilatation. Liver is unremarkable on these unenhanced images. Pancreas: Stable cystic lesion seen in pancreatic tail, largest measuring 13 mm. No acute inflammation or ductal dilatation is noted. Spleen: Normal in size without focal abnormality. Adrenals/Urinary Tract: Adrenal glands are unremarkable. Stable right renal cyst is noted. Right renal cortical scarring is again noted. Stable left renal cyst. Small nonobstructive left renal calculus is noted. Mild left hydroureteronephrosis is noted without evidence of obstructing calculus. Urinary bladder is decompressed secondary to Foley catheter. 8 mm calculus is noted in urinary bladder. Stomach/Bowel: Stomach is within normal limits. Appendix appears normal. No evidence of bowel wall thickening, distention, or inflammatory changes. Vascular/Lymphatic: Aortic atherosclerosis. No enlarged abdominal or pelvic lymph nodes. Reproductive: Uterus and bilateral adnexa are unremarkable. Other: No abdominal wall hernia or abnormality. No abdominopelvic ascites. Musculoskeletal: Status post surgical repair of both proximal femurs. No acute osseous abnormality is noted. Old L1 compression fracture is noted. IMPRESSION: No acute traumatic injury seen in the chest, abdomen or pelvis. 4 mm right middle lobe nodule. No follow-up needed if patient is low-risk.This recommendation follows the consensus statement: Guidelines for Management of Incidental Pulmonary Nodules Detected on CT Images: From the Fleischner Society 2017; Radiology 2017; 284:228-243. Small nonobstructive left renal calculus. Mild left hydroureteronephrosis is noted without evidence of obstructing calculus. Urinary bladder is decompressed secondary to Foley catheter. 8 mm calculus noted in urinary bladder.  Cholelithiasis without inflammation. Stable cystic lesion seen in pancreatic tail, largest measuring 13 mm. Old L1 compression fracture as well surgical internal fixation of old proximal femoral fractures. Aortic Atherosclerosis (ICD10-I70.0). Electronically Signed   By: Lupita Raider M.D.   On: 07/20/2022 11:10   CT HEAD WO CONTRAST  Result Date: 07/20/2022 CLINICAL DATA:  Status post fall. EXAM: CT HEAD WITHOUT  CONTRAST CT CERVICAL SPINE WITHOUT CONTRAST TECHNIQUE: Multidetector CT imaging of the head and cervical spine was performed following the standard protocol without intravenous contrast. Multiplanar CT image reconstructions of the cervical spine were also generated. RADIATION DOSE REDUCTION: This exam was performed according to the departmental dose-optimization program which includes automated exposure control, adjustment of the mA and/or kV according to patient size and/or use of iterative reconstruction technique. COMPARISON:  None Available. FINDINGS: CT HEAD FINDINGS Brain: No evidence of acute infarction, hemorrhage, hydrocephalus, extra-axial collection or mass lesion/mass effect. Previous left MCA infarct is again noted predominantly involving the left temporal lobe. There is mild low-attenuation within the subcortical and periventricular white matter compatible with chronic microvascular disease. Vascular: No hyperdense vessel or unexpected calcification. Skull: Normal. Negative for fracture or focal lesion. Sinuses/Orbits: No acute finding. Other: None. CT CERVICAL SPINE FINDINGS Alignment: Normal. Skull base and vertebrae: No acute fracture. No primary bone lesion or focal pathologic process. Soft tissues and spinal canal: No prevertebral fluid or swelling. No visible canal hematoma. Disc levels: There is multilevel disc space narrowing and endplate spurring within the cervical spine. Solid fusion of the C6-7 vertebra noted. Upper chest: Negative. Other: None IMPRESSION: 1. No acute  intracranial abnormality. 2. Chronic left MCA infarct and small vessel ischemic change. 3. No evidence for cervical spine fracture or subluxation. 4. Cervical degenerative disc disease. Electronically Signed   By: Signa Kell M.D.   On: 07/20/2022 11:09   CT CERVICAL SPINE WO CONTRAST  Result Date: 07/20/2022 CLINICAL DATA:  Status post fall. EXAM: CT HEAD WITHOUT CONTRAST CT CERVICAL SPINE WITHOUT CONTRAST TECHNIQUE: Multidetector CT imaging of the head and cervical spine was performed following the standard protocol without intravenous contrast. Multiplanar CT image reconstructions of the cervical spine were also generated. RADIATION DOSE REDUCTION: This exam was performed according to the departmental dose-optimization program which includes automated exposure control, adjustment of the mA and/or kV according to patient size and/or use of iterative reconstruction technique. COMPARISON:  None Available. FINDINGS: CT HEAD FINDINGS Brain: No evidence of acute infarction, hemorrhage, hydrocephalus, extra-axial collection or mass lesion/mass effect. Previous left MCA infarct is again noted predominantly involving the left temporal lobe. There is mild low-attenuation within the subcortical and periventricular white matter compatible with chronic microvascular disease. Vascular: No hyperdense vessel or unexpected calcification. Skull: Normal. Negative for fracture or focal lesion. Sinuses/Orbits: No acute finding. Other: None. CT CERVICAL SPINE FINDINGS Alignment: Normal. Skull base and vertebrae: No acute fracture. No primary bone lesion or focal pathologic process. Soft tissues and spinal canal: No prevertebral fluid or swelling. No visible canal hematoma. Disc levels: There is multilevel disc space narrowing and endplate spurring within the cervical spine. Solid fusion of the C6-7 vertebra noted. Upper chest: Negative. Other: None IMPRESSION: 1. No acute intracranial abnormality. 2. Chronic left MCA infarct  and small vessel ischemic change. 3. No evidence for cervical spine fracture or subluxation. 4. Cervical degenerative disc disease. Electronically Signed   By: Signa Kell M.D.   On: 07/20/2022 11:09   DG Chest Port 1 View  Result Date: 07/20/2022 CLINICAL DATA:  Sepsis EXAM: PORTABLE CHEST 1 VIEW COMPARISON:  CXR 06/29/22 FINDINGS: No pleural effusion. No pneumothorax. Normal cardiac and mediastinal contours. Bibasilar atelectasis. No radiographically apparent displaced rib fractures. Visualized upper abdomen is unremarkable. IMPRESSION: Bibasilar atelectasis. Electronically Signed   By: Lorenza Cambridge M.D.   On: 07/20/2022 10:29     Labs:   Basic Metabolic Panel: Recent Labs  Lab 07/20/22 1011  07/20/22 1100 07/20/22 1101 07/21/22 0437 07/22/22 0216 07/23/22 0127  NA 131* 132* 132* 135 133* 132*  K 4.4 4.5 4.5 4.7 4.4 4.9  CL 93* 96*  --  102 104 103  CO2 25  --   --  22 23 22   GLUCOSE 300* 295*  --  127* 68* 182*  BUN 37* 37*  --  33* 28* 24*  CREATININE 2.82* 2.90*  --  2.27* 2.02* 1.96*  CALCIUM 8.5*  --   --  7.9* 7.7* 7.7*   GFR Estimated Creatinine Clearance: 24.8 mL/min (A) (by C-G formula based on SCr of 1.96 mg/dL (H)). Liver Function Tests: Recent Labs  Lab 07/20/22 1011  AST 27  ALT 16  ALKPHOS 82  BILITOT 0.4  PROT 7.3  ALBUMIN 2.3*   No results for input(s): "LIPASE", "AMYLASE" in the last 168 hours. No results for input(s): "AMMONIA" in the last 168 hours. Coagulation profile Recent Labs  Lab 07/20/22 1011 07/21/22 0437 07/22/22 0216 07/23/22 0127  INR 2.6* 3.1* 3.0* 2.8*    CBC: Recent Labs  Lab 07/20/22 1011 07/20/22 1100 07/20/22 1101 07/21/22 0437 07/22/22 0216 07/23/22 0127  WBC 13.7*  --   --  12.0* 9.5 10.0  NEUTROABS 11.4*  --   --   --   --   --   HGB 10.9* 13.6 14.3 9.3* 9.0* 9.0*  HCT 33.7* 40.0 42.0 28.3* 28.0* 28.3*  MCV 90.8  --   --  90.7 91.8 93.1  PLT 569*  --   --  519* 547* 546*   Cardiac Enzymes: No results for  input(s): "CKTOTAL", "CKMB", "CKMBINDEX", "TROPONINI" in the last 168 hours. BNP: Invalid input(s): "POCBNP" CBG: Recent Labs  Lab 07/22/22 1113 07/22/22 1543 07/22/22 2044 07/23/22 0718 07/23/22 1125  GLUCAP 153* 158* 209* 142* 205*   D-Dimer No results for input(s): "DDIMER" in the last 72 hours. Hgb A1c No results for input(s): "HGBA1C" in the last 72 hours. Lipid Profile No results for input(s): "CHOL", "HDL", "LDLCALC", "TRIG", "CHOLHDL", "LDLDIRECT" in the last 72 hours. Thyroid function studies No results for input(s): "TSH", "T4TOTAL", "T3FREE", "THYROIDAB" in the last 72 hours.  Invalid input(s): "FREET3" Anemia work up No results for input(s): "VITAMINB12", "FOLATE", "FERRITIN", "TIBC", "IRON", "RETICCTPCT" in the last 72 hours. Microbiology Recent Results (from the past 240 hour(s))  Remove and replace urinary cath (placed > 5 days) then obtain urine culture from new indwelling urinary catheter.     Status: Abnormal   Collection Time: 07/20/22 10:13 AM   Specimen: Urine, Catheterized  Result Value Ref Range Status   Specimen Description URINE, CATHETERIZED  Final   Special Requests   Final    NONE Performed at [facility], [address] [place], [place] [address]    Culture (A)  Final    80,000 COLONIES/mL KLEBSIELLA PNEUMONIAE Confirmed Extended Spectrum Beta-Lactamase Producer (ESBL).  In bloodstream infections from ESBL organisms, carbapenems are preferred over piperacillin/tazobactam. They are shown to have a lower risk of mortality.    Report Status 07/22/2022 FINAL  Final   Organism ID, Bacteria KLEBSIELLA PNEUMONIAE (A)  Final      Susceptibility   Klebsiella pneumoniae - MIC*    AMPICILLIN >=32 RESISTANT Resistant     CEFAZOLIN >=64 RESISTANT Resistant     CEFEPIME >=32 RESISTANT Resistant     CIPROFLOXACIN >=4 RESISTANT Resistant     GENTAMICIN >=16 RESISTANT Resistant     IMIPENEM 1 SENSITIVE Sensitive  NITROFURANTOIN 128 RESISTANT  Resistant     TRIMETH/SULFA >=320 RESISTANT Resistant     AMPICILLIN/SULBACTAM >=32 RESISTANT Resistant     PIP/TAZO 64 INTERMEDIATE Intermediate     * 80,000 COLONIES/mL KLEBSIELLA PNEUMONIAE  Resp panel by RT-PCR (RSV, Flu A&B, Covid) Anterior Nasal Swab     Status: None   Collection Time: 07/20/22 10:14 AM   Specimen: Anterior Nasal Swab  Result Value Ref Range Status   SARS Coronavirus 2 by RT PCR NEGATIVE NEGATIVE Final   Influenza A by PCR NEGATIVE NEGATIVE Final   Influenza B by PCR NEGATIVE NEGATIVE Final    Comment: (NOTE) The Xpert Xpress SARS-CoV-2/FLU/RSV plus assay is intended as an aid in the diagnosis of influenza from Nasopharyngeal swab specimens and should not be used as a sole basis for treatment. Nasal washings and aspirates are unacceptable for Xpert Xpress SARS-CoV-2/FLU/RSV testing.  Fact Sheet for Patients: BloggerCourse.com  Fact Sheet for Healthcare Providers: SeriousBroker.it  This test is not yet approved or cleared by the [place] FDA and has been authorized for detection and/or diagnosis of SARS-CoV-2 by FDA under an Emergency Use Authorization (EUA). This EUA will remain in effect (meaning this test can be used) for the duration of the COVID-19 declaration under Section 564(b)(1) of the Act, 21 U.S.C. section 360bbb-3(b)(1), unless the authorization is terminated or revoked.     Resp Syncytial Virus by PCR NEGATIVE NEGATIVE Final    Comment: (NOTE) Fact Sheet for Patients: BloggerCourse.com  Fact Sheet for Healthcare Providers: SeriousBroker.it  This test is not yet approved or cleared by the [place] FDA and has been authorized for detection and/or diagnosis of SARS-CoV-2 by FDA under an Emergency Use Authorization (EUA). This EUA will remain in effect (meaning this test can be used) for the duration of the COVID-19 declaration under  Section 564(b)(1) of the Act, 21 U.S.C. section 360bbb-3(b)(1), unless the authorization is terminated or revoked.  Performed at [facility], [address], [place], [place] [address]   Blood Culture (routine x 2)     Status: None (Preliminary result)   Collection Time: 07/20/22 10:30 AM   Specimen: BLOOD  Result Value Ref Range Status   Specimen Description BLOOD RIGHT ANTECUBITAL  Final   Special Requests   Final    BOTTLES DRAWN AEROBIC AND ANAEROBIC Blood Culture adequate volume   Culture   Final    NO GROWTH 4 DAYS Performed at [facility], [address], [place], [place] [address]    Report Status PENDING  Incomplete  Blood Culture (routine x 2)     Status: None (Preliminary result)   Collection Time: 07/20/22 11:03 AM   Specimen: BLOOD  Result Value Ref Range Status   Specimen Description BLOOD SITE NOT SPECIFIED  Final   Special Requests   Final    BOTTLES DRAWN AEROBIC AND ANAEROBIC Blood Culture results may not be optimal due to an inadequate volume of blood received in culture bottles   Culture   Final    NO GROWTH 4 DAYS Performed at [facility], [address], [place], [place] [address]    Report Status PENDING  Incomplete    Time coordinating discharge: 45 minutes  Signed: Benjie Ricketson  [facility] 07/24/2022, 1:33 PM

## 2022-07-23 NOTE — Plan of Care (Signed)
  Problem: Education: Goal: Ability to describe self-care measures that may prevent or decrease complications (Diabetes Survival Skills Education) will improve Outcome: Progressing Goal: Individualized Educational Video(s) Outcome: Progressing   Problem: Coping: Goal: Ability to adjust to condition or change in health will improve Outcome: Progressing   Problem: Fluid Volume: Goal: Ability to maintain a balanced intake and output will improve Outcome: Progressing   Problem: Health Behavior/Discharge Planning: Goal: Ability to identify and utilize available resources and services will improve Outcome: Progressing Goal: Ability to manage health-related needs will improve Outcome: Progressing   Problem: Metabolic: Goal: Ability to maintain appropriate glucose levels will improve Outcome: Progressing   Problem: Nutritional: Goal: Maintenance of adequate nutrition will improve Outcome: Progressing Goal: Progress toward achieving an optimal weight will improve Outcome: Progressing   Problem: Skin Integrity: Goal: Risk for impaired skin integrity will decrease Outcome: Progressing   Problem: Tissue Perfusion: Goal: Adequacy of tissue perfusion will improve Outcome: Progressing   Problem: Education: Goal: Knowledge of risk factors and measures for prevention of condition will improve Outcome: Progressing   Problem: Coping: Goal: Psychosocial and spiritual needs will be supported Outcome: Progressing   Problem: Respiratory: Goal: Will maintain a patent airway Outcome: Progressing Goal: Complications related to the disease process, condition or treatment will be avoided or minimized Outcome: Progressing   Problem: Education: Goal: Knowledge of General Education information will improve Description: Including pain rating scale, medication(s)/side effects and non-pharmacologic comfort measures Outcome: Progressing   Problem: Health Behavior/Discharge Planning: Goal:  Ability to manage health-related needs will improve Outcome: Progressing   Problem: Clinical Measurements: Goal: Ability to maintain clinical measurements within normal limits will improve Outcome: Progressing Goal: Will remain free from infection Outcome: Progressing Goal: Diagnostic test results will improve Outcome: Progressing Goal: Respiratory complications will improve Outcome: Progressing Goal: Cardiovascular complication will be avoided Outcome: Progressing   Problem: Activity: Goal: Risk for activity intolerance will decrease Outcome: Progressing   Problem: Nutrition: Goal: Adequate nutrition will be maintained Outcome: Progressing   Problem: Coping: Goal: Level of anxiety will decrease Outcome: Progressing   Problem: Elimination: Goal: Will not experience complications related to bowel motility Outcome: Progressing Goal: Will not experience complications related to urinary retention Outcome: Progressing   Problem: Pain Managment: Goal: General experience of comfort will improve Outcome: Progressing   Problem: Safety: Goal: Ability to remain free from injury will improve Outcome: Progressing   Problem: Skin Integrity: Goal: Risk for impaired skin integrity will decrease Outcome: Progressing   

## 2022-07-23 NOTE — Progress Notes (Signed)
DISCHARGE NOTE HOME [place] [facility] to be discharged Home per MD order. Discussed prescriptions and follow up appointments with the patient. Prescriptions given to patient; medication list explained in detail. Patient verbalized understanding.  Skin clean, dry and intact without evidence of skin break down, no evidence of skin tears noted. IV catheter discontinued intact. Site without signs and symptoms of complications. Dressing and pressure applied. Pt denies pain at the site currently. No complaints noted.  Patient free of lines, drains, and wounds.   An After Visit Summary (AVS) was printed and given to the patient. Patient escorted via wheelchair, and discharged home via private auto. Pt. Picked up by husband Loreen Freud.  Margarita Grizzle, RN

## 2022-07-23 NOTE — Progress Notes (Signed)
ANTICOAGULATION CONSULT NOTE Pharmacy Consult for warfarin Indication: atrial fibrillation  Allergies  Allergen Reactions   Procaine Hcl Anaphylaxis   Amoxicillin Itching    Patient Measurements: Height: 5\' 10"  (177.8 cm) Weight: 72.9 kg (160 lb 11.5 oz) IBW/kg (Calculated) : 68.5  Vital Signs: Temp: 97.5 F (36.4 C) (05/23 0739) Temp Source: Oral (05/23 0739) BP: 159/66 (05/23 0739) Pulse Rate: 63 (05/23 0739)  Labs: Recent Labs    07/20/22 1011 07/20/22 1100 07/20/22 1310 07/21/22 0437 07/22/22 0216 07/23/22 0127  HGB 10.9*   < >  --  9.3* 9.0* 9.0*  HCT 33.7*   < >  --  28.3* 28.0* 28.3*  PLT 569*  --   --  519* 547* 546*  LABPROT 27.8*  --   --  32.6* 31.2* 29.7*  INR 2.6*  --   --  3.1* 3.0* 2.8*  CREATININE 2.82*   < >  --  2.27* 2.02* 1.96*  TROPONINIHS 14  --  13  --   --   --    < > = values in this interval not displayed.     Estimated Creatinine Clearance: 24.8 mL/min (A) (by C-G formula based on SCr of 1.96 mg/dL (H)).   Medications:  Patient's home warfarin regimen is 1.25 mg tablets daily. Her last dose was 5/19.   Assessment: Patient presents to the ED after a fall. She has been weak lately with poor intake and nausea and vomiting. She was recently found to have a UTI and was on bactrim. UTI cultures now indicate ESBL. INR on admission 2.6  INR 2.8 today   Goal of Therapy:  INR 2-3 Monitor platelets by anticoagulation protocol: Yes   Plan:  Resume home dose now and at discharge - 1.25 mg po daily Follow up CBC and daily INR   Thank you Okey Regal, PharmD

## 2022-07-23 NOTE — TOC Transition Note (Signed)
Transition of Care [facility]) - CM/SW Discharge Note   Patient Details  Name: [name] [name] MRN: [id number] Date of Birth: [birth year]  Transition of Care [facility]) CM/SW Contact:  Tom-Johnson, Hershal Coria, RN Phone Number: 07/23/2022, 12:45 PM   Clinical Narrative:     Patient is scheduled for discharge today.  Readmission Risk Assessment done. Outpatient referrals, hospital f/u and discharge instructions on AVS. Husband, [name] to transport at discharge.  No further TOC needs noted.        Final next level of care: Home w Home Health Services Barriers to Discharge: Barriers Resolved   Patient Goals and CMS Choice CMS Medicare.gov Compare Post Acute Care list provided to:: Patient Choice offered to / list presented to : Patient  Discharge Placement                  Patient to be transferred to facility by: Husband Name of family member notified: [name]    Discharge Plan and Services Additional resources added to the After Visit Summary for     Discharge Planning Services: CM Consult Post Acute Care Choice: Home Health          DME Arranged: N/A DME Agency: NA       HH Arranged: PT HH Agency: [facility] Date [facility] Agency Contacted: 07/21/22 Time HH Agency Contacted: 1410 Representative spoke with at [facility] Agency: Kandee Keen  Social Determinants of Health (SDOH) Interventions SDOH Screenings   Food Insecurity: No Food Insecurity (07/20/2022)  Housing: Low Risk  (07/20/2022)  Transportation Needs: No Transportation Needs (07/20/2022)  Utilities: Not At Risk (07/20/2022)  Alcohol Screen: Low Risk  (08/22/2020)  Depression (PHQ2-9): Low Risk  (05/05/2022)  Financial Resource Strain: Low Risk  (08/22/2020)  Physical Activity: Inactive (08/22/2020)  Social Connections: Moderately Integrated (08/22/2020)  Stress: No Stress Concern Present (08/22/2020)  Tobacco Use: Medium Risk (07/20/2022)     Readmission Risk Interventions    07/23/2022   12:43 PM  Readmission  Risk Prevention Plan  Transportation Screening Complete  PCP or Specialist Appt within 5-7 Days Complete  Home Care Screening Complete  Medication Review (RN CM) Referral to Pharmacy

## 2022-07-24 ENCOUNTER — Encounter: Payer: Self-pay | Admitting: *Deleted

## 2022-07-24 ENCOUNTER — Telehealth: Payer: Self-pay | Admitting: *Deleted

## 2022-07-24 LAB — CULTURE, BLOOD (ROUTINE X 2)

## 2022-07-24 NOTE — Transitions of Care (Post Inpatient/ED Visit) (Signed)
07/24/2022  Name: [name] [name] MRN: [id number] DOB: 1940-09-24  Today's TOC FU Call Status: Today's TOC FU Call Status:: Successful TOC FU Call Competed TOC FU Call Complete Date: 07/24/22  Transition Care Management Follow-up Telephone Call Date of Discharge: 07/23/22 Discharge Facility: [facility]) Type of Discharge: Inpatient Admission Primary Inpatient Discharge Diagnosis:: general weakness with syncope post-recent hospitalization for UTI: 30-day readmission How have you been since you were released from the hospital?: Better ("I am doing fine; no problems now.  They took the catheter out and I am peeing just fine.") Any questions or concerns?: No  Items Reviewed: Did you receive and understand the discharge instructions provided?: Yes (thoroughly reviewed with patient who verbalizes good understanding of same) Medications obtained,verified, and reconciled?: Yes (Medications Reviewed) (Full medication reconciliation/ review completed; no concerns or discrepancies identified; confirmed patient obtained/ is taking all newly Rx'd medications as instructed; spouse-manages medications and denies questions/ concerns around medications today) Any new allergies since your discharge?: No Dietary orders reviewed?: Yes Type of Diet Ordered:: "Regular; as healthy as possible" Do you have support at home?: Yes People in Home: spouse Name of Support/Comfort Primary Source: Reports independent in self-care activities; spouse assists as/ if needed/ indicated  Medications Reviewed Today: Medications Reviewed Today     Reviewed by Michaela Corner, RN (Registered Nurse) on 07/24/22 at 1402  Med List Status: <None>   Medication Order Taking? Sig Documenting Provider Last Dose Status Informant  acetaminophen (TYLENOL) 325 MG tablet [id number] Yes Take 325 mg by mouth daily at 6 (six) AM. [provider] Taking Active Spouse/Significant Other, Pharmacy Records, Self  alendronate  (FOSAMAX) 35 MG tablet [id number] Yes Take 35 mg by mouth once a week. [provider] Taking Active Spouse/Significant Other, Self, Pharmacy Records  Apoaequorin (PREVAGEN PO) [id number] Yes Take 1 tablet by mouth daily. [provider] Taking Active Spouse/Significant Other, Self, Pharmacy Records  atorvastatin (LIPITOR) 80 MG tablet [id number] No Take 1 tablet by mouth once daily  Patient not taking: Reported on 07/20/2022   [name], MD Not Taking Active Spouse/Significant Other, Pharmacy Records, Self  diphenhydramine-acetaminophen (TYLENOL PM) 25-500 MG TABS tablet [id number] Yes Take 1 tablet by mouth at bedtime as needed (Prn as needed.). [provider] Taking Active Spouse/Significant Other, Pharmacy Records, Self  ezetimibe (ZETIA) 10 MG tablet [id number] Yes Take 1 tablet (10 mg total) by mouth daily. Chilton Si, MD Taking Active Spouse/Significant Other, Pharmacy Records, Self  furosemide (LASIX) 40 MG tablet [id number] Yes Take 1 tablet (40 mg total) by mouth daily as needed for fluid or edema. Lorin Glass, MD Taking Active   insulin NPH-regular Human (NOVOLIN 70/30 RELION) (70-30) 100 UNIT/ML injection [id number] Yes 10 units in the morning and 5 units at night. Lorin Glass, MD Taking Active   metoprolol succinate (TOPROL-XL) 50 MG 24 hr tablet [id number] Yes TAKE 1 TABLET BY MOUTH ONCE DAILY . APPOINTMENT REQUIRED FOR FUTURE REFILLS  Patient taking differently: Take 50 mg by mouth daily.   Chilton Si, MD Taking Active Spouse/Significant Other, Pharmacy Records, Self  Multiple Vitamins-Minerals (MULTIVITAMIN ADULT PO) [id number] Yes Take 1 tablet by mouth daily with breakfast.  [provider] Taking Active Spouse/Significant Other, Pharmacy Records, Self  VITAMIN D, CHOLECALCIFEROL, PO [id number] Yes Take 1 tablet by mouth daily. [provider] Taking Active Spouse/Significant Other, Pharmacy Records, Self  warfarin (COUMADIN) 2.5  MG tablet [id number] Yes Take 0.5 tablets (1.25 mg total) by mouth daily. Dorcas Carrow, MD Taking Active  Spouse/Significant Other, Self, Pharmacy Records           Med Note Sedonia Small Jul 20, 2022 12:11 PM) Confirmed dosage.             Home Care and Equipment/Supplies: Were Home Health Services Ordered?: Yes Name of Home Health Agency:: [facility] Has Agency set up a time to come to your home?:  (unable to determine-- patient reports "somebody called me and I don't know who it was, they said I had an appointment, but I don't know for sure") Any new equipment or medical supplies ordered?: No  Functional Questionnaire: Do you need assistance with bathing/showering or dressing?: Yes ("sometimes" but reports "mostly" independent- husband assists as indicated/ needed) Do you need assistance with meal preparation?: Yes ("sometimes" but reports "mostly" independent- husband assists as indicated/ needed) Do you need assistance with eating?: No Do you have difficulty maintaining continence: No Do you need assistance with getting out of bed/getting out of a chair/moving?: No Do you have difficulty managing or taking your medications?: Yes ("sometimes" but reports "mostly" independent- husband assists as indicated/ needed)  Follow up appointments reviewed: PCP Follow-up appointment confirmed?: Yes MD Provider Line Number:[phone] Given: No (verified well-established with PCP) Date of PCP follow-up appointment?: 08/03/22 (verified this is recommended time frame for follow up per hospital discharging provider notes; patient adamantly declines assistance in seeing if there are earlier HFU visits available within PCP practice) Follow-up Provider: Dr. Drue Novel, PCP Specialist Hospital Follow-up appointment confirmed?: NA Do you need transportation to your follow-up appointment?: No Do you understand care options if your condition(s) worsen?: Yes-patient verbalized understanding  SDOH  Interventions Today    Flowsheet Row Most Recent Value  SDOH Interventions   Food Insecurity Interventions Intervention Not Indicated  Transportation Interventions Intervention Not Indicated  [husband provides transportation]      TOC Interventions Today    Flowsheet Row Most Recent Value  TOC Interventions   TOC Interventions Discussed/Reviewed TOC Interventions Discussed      Interventions Today    Flowsheet Row Most Recent Value  Chronic Disease   Chronic disease during today's visit Other  [syncope/ weakness after recent hospitalization for UTI/ hematuria]  General Interventions   General Interventions Discussed/Reviewed General Interventions Discussed, Doctor Visits, Referral to Nurse, Communication with, Durable Medical Equipment (DME)  Doctor Visits Discussed/Reviewed Specialist, PCP, Doctor Visits Discussed  Durable Medical Equipment (DME) Dan Humphreys  PCP/Specialist Visits Compliance with follow-up visit  Communication with RN  Nutrition Interventions   Nutrition Discussed/Reviewed Nutrition Discussed  Pharmacy Interventions   Pharmacy Dicussed/Reviewed Pharmacy Topics Discussed  [Full medication review with updating medication list in EHR per patient report]  Safety Interventions   Safety Discussed/Reviewed Safety Discussed      Caryl Pina, RN, BSN, CCRN Alumnus RN CM Care Coordination/ Transition of Care- [facility] Care Management [phone]: direct office

## 2022-07-25 LAB — CULTURE, BLOOD (ROUTINE X 2)

## 2022-07-29 ENCOUNTER — Telehealth: Payer: Self-pay | Admitting: Internal Medicine

## 2022-07-29 NOTE — Telephone Encounter (Signed)
[name] [name] PT, called to advise that Patient reports she is not taking atorvastatin because she has another cholesterol medication.   Pt also has orthostatic hypotension. Pt blood pressure was 90/50 when standing. Pt reported she was taking lasix every day but he advised her that lasix should be taken as needed.   [name] [name] needs verbal orders on PT. Frequency: 2x3,1x1,2x1,1x2  Please call [phone]

## 2022-07-30 NOTE — Telephone Encounter (Signed)
yes

## 2022-07-30 NOTE — Telephone Encounter (Signed)
Discharge summary reviewed.  Advised patient - Hold Lasix - Check BP daily (let me know if is not within range 110-140) and orthostatic vital signs if she gets dizzy when she stands up. - Weight herself daily, call if she gains more than 5 pounds.  Call if shortness of breath or leg swelling. -She is taking zetia and  atorvastatin, 2 cholesterol medications, needs both. - Has an appointment with me soon.  Reach out if questions.

## 2022-07-30 NOTE — Telephone Encounter (Signed)
FYI. Please advise.

## 2022-07-30 NOTE — Telephone Encounter (Signed)
Spoke w/ Debroah Loop- verbal orders given. Made him aware of discharge summary and PCP instructions. Pt has appt w/ PCP on 08/03/22. Debroah Loop wanted to see if PCP can write prescription for compression stockings at that time.

## 2022-07-31 ENCOUNTER — Telehealth: Payer: Self-pay | Admitting: Internal Medicine

## 2022-07-31 NOTE — Telephone Encounter (Signed)
[name] [name] [facility]) called with the following info:  Pt reported a glucose reading of 246 this morning (5.31.24). Pt not happy hospital changed her insulin from 24 and 4 to 10 and 5.

## 2022-08-03 ENCOUNTER — Emergency Department (HOSPITAL_BASED_OUTPATIENT_CLINIC_OR_DEPARTMENT_OTHER): Payer: Medicare Other

## 2022-08-03 ENCOUNTER — Encounter: Payer: Self-pay | Admitting: Internal Medicine

## 2022-08-03 ENCOUNTER — Encounter (HOSPITAL_BASED_OUTPATIENT_CLINIC_OR_DEPARTMENT_OTHER): Payer: Self-pay

## 2022-08-03 ENCOUNTER — Ambulatory Visit (INDEPENDENT_AMBULATORY_CARE_PROVIDER_SITE_OTHER): Payer: Medicare Other | Admitting: Internal Medicine

## 2022-08-03 ENCOUNTER — Inpatient Hospital Stay (HOSPITAL_BASED_OUTPATIENT_CLINIC_OR_DEPARTMENT_OTHER)
Admission: EM | Admit: 2022-08-03 | Discharge: 2022-08-12 | DRG: 308 | Disposition: A | Discharge status: Home Health | Payer: Medicare Other | Attending: Internal Medicine | Admitting: Internal Medicine

## 2022-08-03 VITALS — BP 110/60 | HR 74 | Temp 98.0°F | Resp 12

## 2022-08-03 DIAGNOSIS — I5021 Acute systolic (congestive) heart failure: Secondary | ICD-10-CM | POA: Diagnosis present

## 2022-08-03 DIAGNOSIS — Z794 Long term (current) use of insulin: Secondary | ICD-10-CM

## 2022-08-03 DIAGNOSIS — Z1612 Extended spectrum beta lactamase (ESBL) resistance: Secondary | ICD-10-CM | POA: Diagnosis present

## 2022-08-03 DIAGNOSIS — D631 Anemia in chronic kidney disease: Secondary | ICD-10-CM | POA: Diagnosis present

## 2022-08-03 DIAGNOSIS — E871 Hypo-osmolality and hyponatremia: Secondary | ICD-10-CM | POA: Diagnosis not present

## 2022-08-03 DIAGNOSIS — H5462 Unqualified visual loss, left eye, normal vision right eye: Secondary | ICD-10-CM | POA: Diagnosis present

## 2022-08-03 DIAGNOSIS — I4891 Unspecified atrial fibrillation: Secondary | ICD-10-CM | POA: Diagnosis not present

## 2022-08-03 DIAGNOSIS — K219 Gastro-esophageal reflux disease without esophagitis: Secondary | ICD-10-CM | POA: Diagnosis present

## 2022-08-03 DIAGNOSIS — N1832 Chronic kidney disease, stage 3b: Secondary | ICD-10-CM | POA: Diagnosis not present

## 2022-08-03 DIAGNOSIS — R0789 Other chest pain: Secondary | ICD-10-CM | POA: Diagnosis not present

## 2022-08-03 DIAGNOSIS — R296 Repeated falls: Secondary | ICD-10-CM | POA: Diagnosis present

## 2022-08-03 DIAGNOSIS — Z87891 Personal history of nicotine dependence: Secondary | ICD-10-CM

## 2022-08-03 DIAGNOSIS — Z87440 Personal history of urinary (tract) infections: Secondary | ICD-10-CM

## 2022-08-03 DIAGNOSIS — I2510 Atherosclerotic heart disease of native coronary artery without angina pectoris: Secondary | ICD-10-CM | POA: Diagnosis present

## 2022-08-03 DIAGNOSIS — E1122 Type 2 diabetes mellitus with diabetic chronic kidney disease: Secondary | ICD-10-CM | POA: Diagnosis present

## 2022-08-03 DIAGNOSIS — I509 Heart failure, unspecified: Principal | ICD-10-CM

## 2022-08-03 DIAGNOSIS — Z881 Allergy status to other antibiotic agents status: Secondary | ICD-10-CM

## 2022-08-03 DIAGNOSIS — R791 Abnormal coagulation profile: Secondary | ICD-10-CM | POA: Diagnosis present

## 2022-08-03 DIAGNOSIS — R079 Chest pain, unspecified: Secondary | ICD-10-CM

## 2022-08-03 DIAGNOSIS — I472 Ventricular tachycardia, unspecified: Secondary | ICD-10-CM | POA: Diagnosis not present

## 2022-08-03 DIAGNOSIS — I5023 Acute on chronic systolic (congestive) heart failure: Secondary | ICD-10-CM | POA: Diagnosis present

## 2022-08-03 DIAGNOSIS — E785 Hyperlipidemia, unspecified: Secondary | ICD-10-CM | POA: Diagnosis present

## 2022-08-03 DIAGNOSIS — I480 Paroxysmal atrial fibrillation: Secondary | ICD-10-CM | POA: Diagnosis not present

## 2022-08-03 DIAGNOSIS — R911 Solitary pulmonary nodule: Secondary | ICD-10-CM | POA: Diagnosis present

## 2022-08-03 DIAGNOSIS — I130 Hypertensive heart and chronic kidney disease with heart failure and stage 1 through stage 4 chronic kidney disease, or unspecified chronic kidney disease: Secondary | ICD-10-CM | POA: Diagnosis present

## 2022-08-03 DIAGNOSIS — R627 Adult failure to thrive: Secondary | ICD-10-CM | POA: Diagnosis present

## 2022-08-03 DIAGNOSIS — E1165 Type 2 diabetes mellitus with hyperglycemia: Secondary | ICD-10-CM | POA: Diagnosis not present

## 2022-08-03 DIAGNOSIS — N390 Urinary tract infection, site not specified: Secondary | ICD-10-CM | POA: Diagnosis present

## 2022-08-03 DIAGNOSIS — Z8619 Personal history of other infectious and parasitic diseases: Secondary | ICD-10-CM

## 2022-08-03 DIAGNOSIS — I959 Hypotension, unspecified: Secondary | ICD-10-CM | POA: Diagnosis not present

## 2022-08-03 DIAGNOSIS — E88819 Insulin resistance, unspecified: Secondary | ICD-10-CM | POA: Diagnosis present

## 2022-08-03 DIAGNOSIS — I255 Ischemic cardiomyopathy: Secondary | ICD-10-CM | POA: Diagnosis present

## 2022-08-03 DIAGNOSIS — N184 Chronic kidney disease, stage 4 (severe): Secondary | ICD-10-CM | POA: Diagnosis present

## 2022-08-03 DIAGNOSIS — F419 Anxiety disorder, unspecified: Secondary | ICD-10-CM | POA: Diagnosis present

## 2022-08-03 DIAGNOSIS — I440 Atrioventricular block, first degree: Secondary | ICD-10-CM | POA: Diagnosis not present

## 2022-08-03 DIAGNOSIS — B961 Klebsiella pneumoniae [K. pneumoniae] as the cause of diseases classified elsewhere: Secondary | ICD-10-CM | POA: Diagnosis present

## 2022-08-03 DIAGNOSIS — I252 Old myocardial infarction: Secondary | ICD-10-CM

## 2022-08-03 DIAGNOSIS — I429 Cardiomyopathy, unspecified: Secondary | ICD-10-CM

## 2022-08-03 DIAGNOSIS — Z8249 Family history of ischemic heart disease and other diseases of the circulatory system: Secondary | ICD-10-CM

## 2022-08-03 DIAGNOSIS — G40909 Epilepsy, unspecified, not intractable, without status epilepticus: Secondary | ICD-10-CM | POA: Diagnosis present

## 2022-08-03 DIAGNOSIS — N179 Acute kidney failure, unspecified: Secondary | ICD-10-CM | POA: Diagnosis not present

## 2022-08-03 DIAGNOSIS — R5383 Other fatigue: Secondary | ICD-10-CM | POA: Diagnosis present

## 2022-08-03 DIAGNOSIS — E876 Hypokalemia: Secondary | ICD-10-CM | POA: Diagnosis present

## 2022-08-03 DIAGNOSIS — E11649 Type 2 diabetes mellitus with hypoglycemia without coma: Secondary | ICD-10-CM | POA: Diagnosis not present

## 2022-08-03 DIAGNOSIS — Z7901 Long term (current) use of anticoagulants: Secondary | ICD-10-CM

## 2022-08-03 DIAGNOSIS — Z79899 Other long term (current) drug therapy: Secondary | ICD-10-CM

## 2022-08-03 DIAGNOSIS — T502X5A Adverse effect of carbonic-anhydrase inhibitors, benzothiadiazides and other diuretics, initial encounter: Secondary | ICD-10-CM | POA: Diagnosis not present

## 2022-08-03 DIAGNOSIS — Z833 Family history of diabetes mellitus: Secondary | ICD-10-CM

## 2022-08-03 LAB — PROTIME-INR
INR: 3.9 — ABNORMAL HIGH (ref 0.8–1.2)
Prothrombin Time: 38.6 seconds — ABNORMAL HIGH (ref 11.4–15.2)

## 2022-08-03 LAB — CBC WITH DIFFERENTIAL/PLATELET
Abs Immature Granulocytes: 0.09 10*3/uL — ABNORMAL HIGH (ref 0.00–0.07)
Basophils Absolute: 0 10*3/uL (ref 0.0–0.1)
Basophils Relative: 0 %
Eosinophils Absolute: 0.1 10*3/uL (ref 0.0–0.5)
Eosinophils Relative: 1 %
HCT: 28.9 % — ABNORMAL LOW (ref 36.0–46.0)
Hemoglobin: 9.3 g/dL — ABNORMAL LOW (ref 12.0–15.0)
Immature Granulocytes: 1 %
Lymphocytes Relative: 15 %
Lymphs Abs: 2.1 10*3/uL (ref 0.7–4.0)
MCH: 28.7 pg (ref 26.0–34.0)
MCHC: 32.2 g/dL (ref 30.0–36.0)
MCV: 89.2 fL (ref 80.0–100.0)
Monocytes Absolute: 1.4 10*3/uL — ABNORMAL HIGH (ref 0.1–1.0)
Monocytes Relative: 10 %
Neutro Abs: 10.8 10*3/uL — ABNORMAL HIGH (ref 1.7–7.7)
Neutrophils Relative %: 73 %
Platelets: 662 10*3/uL — ABNORMAL HIGH (ref 150–400)
RBC: 3.24 MIL/uL — ABNORMAL LOW (ref 3.87–5.11)
RDW: 13.2 % (ref 11.5–15.5)
WBC: 14.6 10*3/uL — ABNORMAL HIGH (ref 4.0–10.5)
nRBC: 0 % (ref 0.0–0.2)

## 2022-08-03 LAB — COMPREHENSIVE METABOLIC PANEL
ALT: 21 U/L (ref 0–44)
AST: 51 U/L — ABNORMAL HIGH (ref 15–41)
Albumin: 2.1 g/dL — ABNORMAL LOW (ref 3.5–5.0)
Alkaline Phosphatase: 101 U/L (ref 38–126)
Anion gap: 13 (ref 5–15)
BUN: 29 mg/dL — ABNORMAL HIGH (ref 8–23)
CO2: 25 mmol/L (ref 22–32)
Calcium: 7.9 mg/dL — ABNORMAL LOW (ref 8.9–10.3)
Chloride: 89 mmol/L — ABNORMAL LOW (ref 98–111)
Creatinine, Ser: 2.41 mg/dL — ABNORMAL HIGH (ref 0.44–1.00)
GFR, Estimated: 20 mL/min — ABNORMAL LOW (ref >60)
Glucose, Bld: 313 mg/dL — ABNORMAL HIGH (ref 70–99)
Potassium: 3.7 mmol/L (ref 3.5–5.1)
Sodium: 127 mmol/L — ABNORMAL LOW (ref 135–145)
Total Bilirubin: 0.3 mg/dL (ref 0.3–1.2)
Total Protein: 7.4 g/dL (ref 6.5–8.1)

## 2022-08-03 LAB — GLUCOSE, POCT (MANUAL RESULT ENTRY): POC Glucose: 286 mg/dl — AB (ref 70–99)

## 2022-08-03 LAB — TROPONIN I (HIGH SENSITIVITY)
Troponin I (High Sensitivity): 9 ng/L (ref <18)
Troponin I (High Sensitivity): 9 ng/L (ref <18)

## 2022-08-03 LAB — BRAIN NATRIURETIC PEPTIDE: B Natriuretic Peptide: 1094.9 pg/mL — ABNORMAL HIGH (ref 0.0–100.0)

## 2022-08-03 LAB — LIPASE, BLOOD: Lipase: 21 U/L (ref 11–51)

## 2022-08-03 MED ORDER — DILTIAZEM HCL-DEXTROSE 125-5 MG/125ML-% IV SOLN (PREMIX): 5.0000 mg/h | INTRAVENOUS | Status: DC

## 2022-08-03 MED ORDER — AMIODARONE IV BOLUS ONLY 150 MG/100ML
150.0000 mg | Freq: Once | INTRAVENOUS | Status: AC
  Administered 2022-08-03: 150 mg via INTRAVENOUS
  Filled 2022-08-03: qty 100

## 2022-08-03 MED ORDER — MORPHINE SULFATE (PF) 4 MG/ML IV SOLN: 4.0000 mg | Freq: Once | INTRAVENOUS | Status: DC

## 2022-08-03 MED ORDER — FUROSEMIDE 10 MG/ML IJ SOLN: 20.0000 mg | Freq: Once | INTRAMUSCULAR | Status: DC

## 2022-08-03 MED ORDER — INSULIN ASPART PROT & ASPART (70-30 MIX) 100 UNIT/ML ~~LOC~~ SUSP
10.0000 [IU] | Freq: Every day | SUBCUTANEOUS | Status: DC
  Administered 2022-08-04: 10 [IU] via SUBCUTANEOUS
  Filled 2022-08-03: qty 10

## 2022-08-03 MED ORDER — DILTIAZEM LOAD VIA INFUSION
10.0000 mg | Freq: Once | INTRAVENOUS | Status: DC
  Filled 2022-08-03: qty 10

## 2022-08-03 MED ORDER — SODIUM CHLORIDE 0.9 % IV SOLN: INTRAVENOUS | Status: DC | PRN

## 2022-08-03 MED ORDER — FUROSEMIDE 10 MG/ML IJ SOLN
20.0000 mg | Freq: Two times a day (BID) | INTRAMUSCULAR | Status: DC
  Administered 2022-08-04: 20 mg via INTRAVENOUS
  Filled 2022-08-03: qty 2

## 2022-08-03 MED ORDER — AMIODARONE LOAD VIA INFUSION
150.0000 mg | Freq: Once | INTRAVENOUS | Status: DC
Start: 2022-08-03 — End: 2022-08-03

## 2022-08-03 MED ORDER — WARFARIN - PHARMACIST DOSING INPATIENT
Freq: Every day | Status: DC
  Filled 2022-08-03: qty 1

## 2022-08-03 MED ORDER — ATORVASTATIN CALCIUM 80 MG PO TABS
80.0000 mg | ORAL_TABLET | Freq: Every day | ORAL | Status: DC
  Administered 2022-08-04 – 2022-08-12 (×9): 80 mg via ORAL
  Filled 2022-08-03 (×9): qty 1

## 2022-08-03 MED ORDER — FUROSEMIDE 10 MG/ML IJ SOLN
40.0000 mg | Freq: Once | INTRAMUSCULAR | Status: AC
  Administered 2022-08-03: 40 mg via INTRAVENOUS
  Filled 2022-08-03: qty 4

## 2022-08-03 MED ORDER — INSULIN ASPART PROT & ASPART (70-30 MIX) 100 UNIT/ML ~~LOC~~ SUSP
5.0000 [IU] | Freq: Every day | SUBCUTANEOUS | Status: DC
  Filled 2022-08-03: qty 10

## 2022-08-03 MED ORDER — FENTANYL CITRATE PF 50 MCG/ML IJ SOSY
50.0000 ug | PREFILLED_SYRINGE | Freq: Once | INTRAMUSCULAR | Status: AC
  Administered 2022-08-03: 50 ug via INTRAVENOUS
  Filled 2022-08-03: qty 1

## 2022-08-03 MED ORDER — INSULIN ASPART 100 UNIT/ML IJ SOLN
0.0000 [IU] | Freq: Every day | INTRAMUSCULAR | Status: DC
  Administered 2022-08-04 – 2022-08-05 (×2): 2 [IU] via SUBCUTANEOUS

## 2022-08-03 MED ORDER — EZETIMIBE 10 MG PO TABS
10.0000 mg | ORAL_TABLET | Freq: Every day | ORAL | Status: DC
  Administered 2022-08-04 – 2022-08-12 (×9): 10 mg via ORAL
  Filled 2022-08-03 (×10): qty 1

## 2022-08-03 MED ORDER — INSULIN ASPART 100 UNIT/ML IJ SOLN
0.0000 [IU] | Freq: Three times a day (TID) | INTRAMUSCULAR | Status: DC
  Administered 2022-08-04: 8 [IU] via SUBCUTANEOUS
  Administered 2022-08-04: 5 [IU] via SUBCUTANEOUS
  Administered 2022-08-05: 8 [IU] via SUBCUTANEOUS
  Administered 2022-08-05: 3 [IU] via SUBCUTANEOUS
  Administered 2022-08-05: 5 [IU] via SUBCUTANEOUS
  Administered 2022-08-06: 8 [IU] via SUBCUTANEOUS
  Administered 2022-08-06: 5 [IU] via SUBCUTANEOUS
  Administered 2022-08-06: 8 [IU] via SUBCUTANEOUS
  Administered 2022-08-07 (×2): 2 [IU] via SUBCUTANEOUS
  Administered 2022-08-08: 3 [IU] via SUBCUTANEOUS
  Administered 2022-08-09 (×2): 5 [IU] via SUBCUTANEOUS
  Administered 2022-08-10: 2 [IU] via SUBCUTANEOUS
  Administered 2022-08-10 – 2022-08-11 (×3): 3 [IU] via SUBCUTANEOUS

## 2022-08-03 NOTE — Discharge Instructions (Addendum)
    ____________________________________________________________________________________________________________________________ Information on my medicine - ELIQUIS (apixaban)  This medication education was reviewed with me or my healthcare representative as part of my discharge preparation.  The pharmacist that spoke with me during my hospital stay was:  Denver Harder G Zakeria Kulzer, RPH  Why was Eliquis prescribed for you? Eliquis was prescribed for you to reduce the risk of a blood clot forming that can cause a stroke if you have a medical condition called atrial fibrillation (a type of irregular heartbeat).  What do You need to know about Eliquis ? Take your Eliquis TWICE DAILY - one tablet in the morning and one tablet in the evening with or without food. If you have difficulty swallowing the tablet whole please discuss with your pharmacist how to take the medication safely.  Take Eliquis exactly as prescribed by your doctor and DO NOT stop taking Eliquis without talking to the doctor who prescribed the medication.  Stopping may increase your risk of developing a stroke.  Refill your prescription before you run out.  After discharge, you should have regular check-up appointments with your healthcare provider that is prescribing your Eliquis.  In the future your dose may need to be changed if your kidney function or weight changes by a significant amount or as you get older.  What do you do if you miss a dose? If you miss a dose, take it as soon as you remember on the same day and resume taking twice daily.  Do not take more than one dose of ELIQUIS at the same time to make up a missed dose.  Important Safety Information A possible side effect of Eliquis is bleeding. You should call your healthcare provider right away if you experience any of the following: Bleeding from an injury or your nose that does not stop. Unusual colored urine (red or dark brown) or unusual colored stools (red or  black). Unusual bruising for unknown reasons. A serious fall or if you hit your head (even if there is no bleeding).  Some medicines may interact with Eliquis and might increase your risk of bleeding or clotting while on Eliquis. To help avoid this, consult your healthcare provider or pharmacist prior to using any new prescription or non-prescription medications, including herbals, vitamins, non-steroidal anti-inflammatory drugs (NSAIDs) and supplements.  This website has more information on Eliquis (apixaban): http://www.eliquis.com/eliquis/home  

## 2022-08-03 NOTE — ED Triage Notes (Signed)
Came from a MD appt upstairs Per MD pt has been pale, tried to get and EKG unable to States she is more pale today hgb usually 9 +nausea

## 2022-08-03 NOTE — Telephone Encounter (Signed)
Discharged from the hospital on 70/30 insulin 10 units in the morning and 5 at night. Previously on 26 units before breakfast and 5 at bedtime per Endo. Plan:  Advised to increase morning insulin to 15 units and continue with 5 units at night. Reach out to Endo for further advice.

## 2022-08-03 NOTE — ED Notes (Signed)
This RN assisted patient onto bedside commode. 

## 2022-08-03 NOTE — ED Provider Notes (Signed)
[facility] Provider Note   CSN: [id number] Arrival date & time: 08/03/22  1654     History Chief Complaint  Patient presents with   Chest Pain    HPI [name] [name] is a 82 y.o. female presenting for chief complaint of chest pain.  Per patient's clinic visit earlier today, patient presented for follow-up of a hemorrhagic cystitis admission here but immediately on arrival to the clinic she was complaining about epigastric to right upper chest pain.  Was into severe distress to participate in the exam sent down for further care and management.  Per recent discharge summary, patient has a history of diabetes hypertension hyperlipidemia A-fib on warfarin, coronary artery disease, cardiomyopathy, CKD, CHF, ESBL UTIs which required this most recent admission..  Family says that she she has had failure to thrive worsening fatigue malaise over the past few days with acute exacerbation of chest pain and malaise today.  Patient's recorded medical, surgical, social, medication list and allergies were reviewed in the Snapshot window as part of the initial history.   Review of Systems   Review of Systems  Constitutional:  Positive for fatigue. Negative for chills and fever.  HENT:  Negative for ear pain and sore throat.   Eyes:  Negative for pain and visual disturbance.  Respiratory:  Positive for shortness of breath. Negative for cough.   Cardiovascular:  Positive for chest pain. Negative for palpitations.  Gastrointestinal:  Positive for abdominal pain and nausea. Negative for vomiting.  Genitourinary:  Negative for dysuria and hematuria.  Musculoskeletal:  Negative for arthralgias and back pain.  Skin:  Negative for color change and rash.  Neurological:  Negative for seizures and syncope.  All other systems reviewed and are negative.   Physical Exam Updated Vital Signs BP (!) 139/101 (BP Location: Right Arm)   Pulse 74   Temp (!) 97.1 F  (36.2 C) (Oral)   Resp 18   Ht 5\' 9"  (1.753 m)   Wt 73 kg   SpO2 100%   BMI 23.78 kg/m  Physical Exam Vitals and nursing note reviewed.  Constitutional:      General: She is not in acute distress.    Appearance: She is well-developed.  HENT:     Head: Normocephalic and atraumatic.  Eyes:     Conjunctiva/sclera: Conjunctivae normal.  Cardiovascular:     Rate and Rhythm: Normal rate and regular rhythm.     Heart sounds: No murmur heard. Pulmonary:     Effort: Pulmonary effort is normal. No respiratory distress.     Breath sounds: Normal breath sounds.  Abdominal:     General: There is no distension.     Palpations: Abdomen is soft.     Tenderness: There is no abdominal tenderness. There is no right CVA tenderness or left CVA tenderness.  Musculoskeletal:        General: No swelling or tenderness. Normal range of motion.     Cervical back: Neck supple.  Skin:    General: Skin is warm and dry.  Neurological:     General: No focal deficit present.     Mental Status: She is alert and oriented to person, place, and time. Mental status is at baseline.     Cranial Nerves: No cranial nerve deficit.      ED Course/ Medical Decision Making/ A&P Clinical Course as of 08/03/22 2007  Mon Aug 03, 2022  1745 [place] [CC]  2006 B Natriuretic Peptide(!): 1,094.9 [CC]  Clinical Course User Index [CC] Glyn Ade, MD    Procedures .Critical Care  Performed by: Glyn Ade, MD Authorized by: Glyn Ade, MD   Critical care provider statement:    Critical care time (minutes):  75   Critical care was necessary to treat or prevent imminent or life-threatening deterioration of the following conditions:  Circulatory failure and cardiac failure   Critical care was time spent personally by me on the following activities:  Development of treatment plan with patient or surrogate, discussions with consultants, evaluation of patient's response to treatment, examination  of patient, ordering and review of laboratory studies, ordering and review of radiographic studies, ordering and performing treatments and interventions, pulse oximetry, re-evaluation of patient's condition and review of old charts   Care discussed with: admitting provider and accepting provider at another facility   Ultrasound ED Echo  Date/Time: 08/03/2022 6:08 PM  Performed by: Glyn Ade, MD Authorized by: Glyn Ade, MD   Procedure details:    Indications: cardiac arrest     Views: subxiphoid, parasternal long axis view, parasternal short axis view, apical 4 chamber view and IVC view     Images: not archived     Limitations:  Body habitus Findings:    Pericardium: no pericardial effusion     LV Function: severly depressed (<30%)     RV Diameter: normal     IVC: normal   Impression:    Impression: probable elevated CVP and decreased contractility      Medications Ordered in ED Medications  0.9 %  sodium chloride infusion ( Intravenous Stopped 08/03/22 1823)  fentaNYL (SUBLIMAZE) injection 50 mcg (50 mcg Intravenous Given 08/03/22 1716)  amiodarone (NEXTERONE) 1.5 mg/mL IV bolus only 150 mg (0 mg Intravenous Stopped 08/03/22 1813)  furosemide (LASIX) injection 40 mg (40 mg Intravenous Given 08/03/22 1824)    Medical Decision Making:    [name] [name] is a 82 y.o. female who presented to the ED today with chest pain detailed above.    I was critically called to bedside because of a heart rate of 170 on the initial EKG. Appears to be A-fib with RVR.  Patient is moaning in bed exquisite pain all throughout her body. Point of care echocardiogram was performed as above demonstrates reduced ejection fraction on my exam.  IVC was greater than 2 cm EPSS greater than 8 mm. This appears to be deterioration of ventricular function from her last exam.  Based on this, initiation patient on diltiazem is not indicated for concern for precipitation of cardiogenic shock.   Assessment  and plan: Patient history of present on his physicals and findings are concerning for atrial fibrillation with RVR with uncertain underlying provocation.  She has a history of similar, may be a heart failure exacerbation given recent discontinuation of Lasix from her inpatient hospitalization. Will evaluate for underlying infection, metabolic disruption, worsening kidney dysfunction in the emergency department with extensive screening laboratory and radiographic evaluation all of which was reviewed.  Final assessment and plan: Exam is concurrently most consistent with acute decompensated heart failure. Consulted cardiology Dr. Shari Prows agreed with initiation of amiodarone, admission to medicine, gentle diuresis in the setting of kidney disease. Disposition:   Based on the above findings, I believe this patient is stable for admission.    Patient/family educated about specific findings on our evaluation and explained exact reasons for admission.  Patient/family educated about clinical situation and time was allowed to answer questions.   Admission team communicated with and agreed  with need for admission. Patient admitted. Patient ready to move at this time.     Emergency Department Medication Summary:   Medications  0.9 %  sodium chloride infusion ( Intravenous Stopped 08/03/22 1823)  fentaNYL (SUBLIMAZE) injection 50 mcg (50 mcg Intravenous Given 08/03/22 1716)  amiodarone (NEXTERONE) 1.5 mg/mL IV bolus only 150 mg (0 mg Intravenous Stopped 08/03/22 1813)  furosemide (LASIX) injection 40 mg (40 mg Intravenous Given 08/03/22 1824)          Clinical Impression:  1. Acute on chronic congestive heart failure, unspecified heart failure type (HCC)      Admit   Final Clinical Impression(s) / ED Diagnoses Final diagnoses:  Acute on chronic congestive heart failure, unspecified heart failure type [facility])    Rx / DC Orders ED Discharge Orders     None         Glyn Ade,  MD 08/03/22 2007

## 2022-08-03 NOTE — Patient Instructions (Signed)
Proceed to the emergency room 

## 2022-08-03 NOTE — Telephone Encounter (Signed)
Pt in office today for visit.  She stated that she just started back her usual dose 23 in the morning and 5 units at night.  On the dose from the hospital she had sugar as high as 387.

## 2022-08-03 NOTE — Progress Notes (Signed)
ANTICOAGULATION CONSULT NOTE - Initial Consult  Pharmacy Consult for warfarin  Indication: atrial fibrillation  Allergies  Allergen Reactions   Procaine Hcl Anaphylaxis   Amoxicillin Itching    Patient Measurements: Height: 5\' 9"  (175.3 cm) Weight: 73 kg (161 lb) IBW/kg (Calculated) : 66.2  Vital Signs: Temp: 97.5 F (36.4 C) (06/03 2145) Temp Source: Oral (06/03 2145) BP: 116/69 (06/03 2300) Pulse Rate: 80 (06/03 2300)  Labs: Recent Labs    08/03/22 1655 08/03/22 1708 08/03/22 1904  HGB 9.3*  --   --   HCT 28.9*  --   --   PLT 662*  --   --   LABPROT  --  38.6*  --   INR  --  3.9*  --   CREATININE 2.41*  --   --   TROPONINIHS 9  --  9    Estimated Creatinine Clearance: 19.5 mL/min (A) (by C-G formula based on SCr of 2.41 mg/dL (H)).   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   HYPERLIPIDEMIA 03/27/2006   HYPERTENSION 03/27/2006   Intertrochanteric fracture of right hip (HCC) 07/13/2012   LUNG NODULE 09/01/2006   Excision, Bx Benighn   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"   Osteopenia 2004   Dexa 2004 showed Osteopenia, DEXA 03/2007 normal   Osteopenia    Other chronic cystitis with hematuria    Pelvic fracture (HCC) 06/16/2016   S/P fall   Recurrent urinary tract infection    Seeing Urology   RETINOPATHY, BACKGROUND NOS 03/27/2006   Seizures (HCC)    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 Was found to be  in Afib with RVR in ED w fluid overload. Warfarin regimen from previous admission 1.25mg  daily. Unclear last dose, INR today 3.9. Of note patient recently on course of Bactrim however has been > 1 week.   Goal of Therapy:  INR 2-3 Monitor platelets by anticoagulation protocol: Yes   Plan:  Will hold warfarin dose today.  F/u med rec to ensure no med changes.  Daily INR.   Estill Batten, PharmD, BCCCP  08/03/2022,11:16 PM

## 2022-08-03 NOTE — Progress Notes (Unsigned)
Subjective:    Patient ID: [name] [name], female    DOB: 1940-06-12, 82 y.o.   MRN: [id number]  DOS:  08/03/2022 Type of visit - description: Hospital follow-up, here with her husband.  The patient was admitted twice recently, chart is reviewed, see below. Her main complaint today is chest pain, she actually reports that he is going on for "years" however when I keep pressing for a more certain answer she said it started (or at least got worse) yesterday. I also asked to point to where she is hurting and she points to the epigastrium. The pain does not radiate posteriorly. Denies nausea but she had 2 episodes of sudden vomiting yesterday with no hematemesis. Appetite is extremely poor. She is very weak and she can't actually walk.  Recently had hemorrhagic cystitis but reported her urine lo looks normal and she has no problems urinating. Denies lower extremity edema. Stool color is normal.   Extensive chart review from 2 previous admissions: Admitted to the hospital twice since the last time I saw her. On 07/07/22: Diagnosed and treated for gross hematuria, and INR was 7.9, difficulty voiding. Urology consulted, they did a continuous bladder irrigation, urine culture showed E. coli, Klebsiella.  Was treated with IV and p.o. antibiotics for a total of 10 days. Was discharged with a Foley catheter in.  Afterwards, she was doing okay until 2 days prior to the Jul 20, 2022 admission. Chief complaint was decreased p.o., malaise. At the ER, she was borderline tachycardic, CT showed again mild hydro and ureteral nephrosis on the left but the bladder was decompressed with a Foley in place.  She had a urinary bladder stone. INR was 2.6. -Was found to have acute on chronic CKD, baseline creatinine 1.4, upon admission 2.9.  Improved with IV fluids.   -Gross hematuria, case was discussed with urology, they recommended a Foley removal she had a successful voiding trial. -For recent UTI they  recommended to hold additional antibiotics. -DM insulin dose decreased based on CBGs in the hospital. -Continue with Coumadin. -C/o R hip pain, x-ray negative.  They are recommending home PT.   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   HYPERLIPIDEMIA 03/27/2006   HYPERTENSION 03/27/2006   Intertrochanteric fracture of right hip (HCC) 07/13/2012   LUNG NODULE 09/01/2006   Excision, Bx Benighn   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"   Osteopenia 2004   Dexa 2004 showed Osteopenia, DEXA 03/2007 normal   Osteopenia    Other chronic cystitis with hematuria    Pelvic fracture (HCC) 06/16/2016   S/P fall   Recurrent urinary tract infection    Seeing Urology   RETINOPATHY, BACKGROUND NOS 03/27/2006   Seizures (HCC)    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    Past Surgical History:  Procedure Laterality Date   ANKLE FRACTURE SURGERY Bilateral 08/2015   "right one was in 2 places"   CARDIAC CATHETERIZATION  N/A 05/17/2015   Procedure: Left Heart Cath and Coronary Angiography;  Surgeon: Corky Crafts, MD;  [facility]AS[facility] CV LAB;  Service: Cardiovascular;  Laterality: N/A;   CARDIAC CATHETERIZATION N/A 05/17/2015   Procedure: Coronary Balloon Angioplasty;  Surgeon: Corky Crafts, MD;  Location: [facility] INVASIVE CV LAB;  Service: Cardiovascular;  Laterality: N/A;   FEMUR IM NAIL Right 07/15/2012   Procedure: INTRAMEDULLARY (IM) NAIL HIP;  Surgeon: Eulas Post, MD;  Location: [facility] OR;  Service:  Orthopedics;  Laterality: Right;   FEMUR IM NAIL Left 12/02/2015   Procedure: INTRAMEDULLARY (IM) NAIL FEMORAL;  Surgeon: Eldred Manges, MD;  Location: [facility] OR;  Service: Orthopedics;  Laterality: Left;   FRACTURE SURGERY     INCISION AND DRAINAGE HIP Left 01/03/2016   Procedure: IRRIGATION AND DEBRIDEMENT HIP;  Surgeon: Eldred Manges, MD;  Location: [facility] OR;  Service: Orthopedics;  Laterality: Left;   SHOULDER SURGERY Bilateral    "don't remember which side"    Current Outpatient Medications  Medication Instructions   Apoaequorin (PREVAGEN PO) 1 tablet, Oral, Daily   atorvastatin (LIPITOR) 80 mg, Oral, Daily   ezetimibe (ZETIA) 10 mg, Oral, Daily   furosemide (LASIX) 40 mg, Oral, Daily PRN   insulin NPH-regular Human (NOVOLIN 70/30 RELION) (70-30) 100 UNIT/ML injection 10 units in the morning and 5 units at night.   metoprolol succinate (TOPROL-XL) 50 MG 24 hr tablet TAKE 1 TABLET BY MOUTH ONCE DAILY . APPOINTMENT REQUIRED FOR FUTURE REFILLS   Multiple Vitamins-Minerals (MULTIVITAMIN ADULT PO) 1 tablet, Oral, Daily with breakfast   VITAMIN D, CHOLECALCIFEROL, PO 1 tablet, Oral, Daily   warfarin (COUMADIN) 1.25 mg, Oral, Daily       Objective:   Physical Exam BP 110/60 (BP Location: Right Arm, Cuff Size: Normal)   Pulse (!) 43   Temp 98 F (36.7 C) (Oral)   Resp 12   SpO2 96%  General:   Well developed, the patient is sitting in a wheelchair, her head down, grabbing her head with her hands. Reports severe pain when I asked her to sit straight.  No diaphoretic. Did look nauseated x 1 but has not vomited following this office. HEENT:  Normocephalic . Face symmetric, atraumatic.  Conjunctiva: Pale Lungs:  CTA B Normal respiratory effort, no intercostal retractions, no accessory muscle use. Heart: RRR,  no murmur.  Chest wall: No TTP Abdomen:  Not distended, soft, tender at the epigastrium.  No masses. Skin: Pale.   Lower extremities: no pretibial edema bilaterally  Neurologic:   alert & oriented X3.  Speech normal, gait not tested. Psych--  Cognition and judgment appear intact.  Cooperative with normal attention span and concentration.  Behavior appropriate. No anxious or depressed appearing.     Assessment    Assessment   DM  + retinopathy, Dr Everardo All HTN CKD: Elevated creatinine(started after admission 05-2015, had IV contrast ( Creatinine 1.5 = GFR ~35 ) Hyperlipidemia anxiety ativan GERD Anemia, normal iron 05-2015, never had a colonoscopy CV ---NSTEMI 05-2015, cath, angiplasty ---Echo, EF 40 % (05-2015) ---Paroxysmal Atrial Fibrillation/ Atrial Flutter s/p Ablation / History of SVT  on coumadin  GU Urinary retention /UTI 05-2015, had a foley temporarily, saw urology, PVR 249 cc (high), rx timed voiding Neuro: -Herpes encephalitis 2014 -Seizures (after encephalitis), sz episode 08-2015 (?) - HAs-dizzines - encephalomalacia per CT  Blindness, left eye Right eye weakness after cataract surgery Osteoporosis:   DEXA 2004, DEXA 2009 normal,  dexa 2015 @ [facility] -1.8.  T score -1.8 in 2018.   Hip fracture 2017;L1 vertebral fracture 2020 >>> Rx  alendronate 07/2018 H/o lung bx 2008 benign  Abnormal CT pancreas 12-2021: Rx MRI in 1 year  PLAN: Acute/severe pain at the epigastrium/chest. The patient is here for two hospital follow-ups however her main symptom is weakness, severe pain pain at the chest and epigastric area as described above. CT chest abdomen and pelvis done 07/20/2022: L hydroureter on nephrosis, cholelithiasis.  No acute findings in the chest. She seems very uncomfortable.   At home, she is not eating, unable to ambulate. EKG: Unable to proceed, she is sitting in a chair and we only got artifact. CAD, CHF: See above, complaining of pain, no evidence of volume overload. Recent hemorrhagic UTI: Status post antibiotics, reports normal-appearing urine. Anemia: Last hemoglobin 9, she is anticoagulated, looks very pale today.  No hematemesis, no  change in the color of the stools. DM: Was discharged home with a reduced dose of insulin but sugars were elevated at home so she decided to go back on 70/30 insulin 23 units a.m., 5 units p.m. CBG -286.  (Has not eaten today) Plan: Refer to the ER for further evaluation and treatment.  Appreciate ER MD accepted the patient.

## 2022-08-03 NOTE — Progress Notes (Addendum)
This is a 82 y.o. female with medical history significant of recent ESBL UTI, IDDM with insulin resistance, CKD stage IV, ischemic cardiomyopathy, PAF on Coumadin, HLD, HTN, seizures, systolic CHF, DM type 2, and blindness left eye.   Patient presented to PCPs office today.  She had a routine follow-up posthospital discharge follow-up.  There she reported having crushing left-sided chest pains, shortness of breath, and nausea from earlier that morning.  She was sent to the [facility] ER.  In the ER patient found to be in A-fib with RVR with heart rate up to 160s.  Patient initially appeared fluid overloaded.  She was started on amiodarone drip, currently heart rate is in the 90s.  IV Lasix also given.  Echo done in ER showed EF approximately 30%.  IV Lasix given as patient appeared to fluid overloaded to EDP.  BNP 1,094.4.  Troponin 9.  Cardiologist Dr. Shari Prows was contacted, he agreed with amiodarone and gentle diuresis. Sodium 127

## 2022-08-03 NOTE — ED Notes (Signed)
Patient states pain has greatly improved after amiodarone administration, states "it doesn't feel like there is a crushing pain in my chest anymore". Appears more comfortable. HR ranging from 100-130, rate more controlled. Remains in A.fib

## 2022-08-04 ENCOUNTER — Other Ambulatory Visit: Payer: Self-pay

## 2022-08-04 DIAGNOSIS — I5021 Acute systolic (congestive) heart failure: Secondary | ICD-10-CM

## 2022-08-04 DIAGNOSIS — I5022 Chronic systolic (congestive) heart failure: Secondary | ICD-10-CM | POA: Diagnosis not present

## 2022-08-04 DIAGNOSIS — G40909 Epilepsy, unspecified, not intractable, without status epilepticus: Secondary | ICD-10-CM | POA: Diagnosis not present

## 2022-08-04 DIAGNOSIS — R791 Abnormal coagulation profile: Secondary | ICD-10-CM | POA: Diagnosis present

## 2022-08-04 DIAGNOSIS — E1165 Type 2 diabetes mellitus with hyperglycemia: Secondary | ICD-10-CM | POA: Diagnosis not present

## 2022-08-04 DIAGNOSIS — N132 Hydronephrosis with renal and ureteral calculous obstruction: Secondary | ICD-10-CM | POA: Diagnosis not present

## 2022-08-04 DIAGNOSIS — E871 Hypo-osmolality and hyponatremia: Secondary | ICD-10-CM | POA: Diagnosis not present

## 2022-08-04 DIAGNOSIS — E1122 Type 2 diabetes mellitus with diabetic chronic kidney disease: Secondary | ICD-10-CM | POA: Diagnosis not present

## 2022-08-04 DIAGNOSIS — I5023 Acute on chronic systolic (congestive) heart failure: Secondary | ICD-10-CM | POA: Diagnosis not present

## 2022-08-04 DIAGNOSIS — E785 Hyperlipidemia, unspecified: Secondary | ICD-10-CM | POA: Diagnosis present

## 2022-08-04 DIAGNOSIS — E11649 Type 2 diabetes mellitus with hypoglycemia without coma: Secondary | ICD-10-CM | POA: Diagnosis not present

## 2022-08-04 DIAGNOSIS — I480 Paroxysmal atrial fibrillation: Secondary | ICD-10-CM | POA: Diagnosis present

## 2022-08-04 DIAGNOSIS — I4891 Unspecified atrial fibrillation: Secondary | ICD-10-CM | POA: Diagnosis present

## 2022-08-04 DIAGNOSIS — R627 Adult failure to thrive: Secondary | ICD-10-CM | POA: Diagnosis present

## 2022-08-04 DIAGNOSIS — N184 Chronic kidney disease, stage 4 (severe): Secondary | ICD-10-CM | POA: Diagnosis not present

## 2022-08-04 DIAGNOSIS — I130 Hypertensive heart and chronic kidney disease with heart failure and stage 1 through stage 4 chronic kidney disease, or unspecified chronic kidney disease: Secondary | ICD-10-CM | POA: Diagnosis not present

## 2022-08-04 DIAGNOSIS — D631 Anemia in chronic kidney disease: Secondary | ICD-10-CM | POA: Diagnosis not present

## 2022-08-04 DIAGNOSIS — E88819 Insulin resistance, unspecified: Secondary | ICD-10-CM | POA: Diagnosis not present

## 2022-08-04 DIAGNOSIS — F419 Anxiety disorder, unspecified: Secondary | ICD-10-CM | POA: Diagnosis present

## 2022-08-04 DIAGNOSIS — Z794 Long term (current) use of insulin: Secondary | ICD-10-CM | POA: Diagnosis not present

## 2022-08-04 DIAGNOSIS — I252 Old myocardial infarction: Secondary | ICD-10-CM | POA: Diagnosis not present

## 2022-08-04 DIAGNOSIS — N179 Acute kidney failure, unspecified: Secondary | ICD-10-CM | POA: Diagnosis not present

## 2022-08-04 DIAGNOSIS — I959 Hypotension, unspecified: Secondary | ICD-10-CM | POA: Diagnosis not present

## 2022-08-04 DIAGNOSIS — I472 Ventricular tachycardia, unspecified: Secondary | ICD-10-CM | POA: Diagnosis not present

## 2022-08-04 DIAGNOSIS — I440 Atrioventricular block, first degree: Secondary | ICD-10-CM | POA: Diagnosis not present

## 2022-08-04 DIAGNOSIS — N390 Urinary tract infection, site not specified: Secondary | ICD-10-CM | POA: Diagnosis present

## 2022-08-04 DIAGNOSIS — R0789 Other chest pain: Secondary | ICD-10-CM | POA: Diagnosis not present

## 2022-08-04 DIAGNOSIS — Z1612 Extended spectrum beta lactamase (ESBL) resistance: Secondary | ICD-10-CM | POA: Diagnosis present

## 2022-08-04 LAB — TSH: TSH: 0.715 u[IU]/mL (ref 0.350–4.500)

## 2022-08-04 LAB — GLUCOSE, CAPILLARY
Glucose-Capillary: 227 mg/dL — ABNORMAL HIGH (ref 70–99)
Glucose-Capillary: 77 mg/dL (ref 70–99)
Glucose-Capillary: 101 mg/dL — ABNORMAL HIGH (ref 70–99)
Glucose-Capillary: 210 mg/dL — ABNORMAL HIGH (ref 70–99)

## 2022-08-04 LAB — CBG MONITORING, ED
Glucose-Capillary: 251 mg/dL — ABNORMAL HIGH (ref 70–99)
Glucose-Capillary: 282 mg/dL — ABNORMAL HIGH (ref 70–99)

## 2022-08-04 LAB — BASIC METABOLIC PANEL
Anion gap: 10 (ref 5–15)
BUN: 29 mg/dL — ABNORMAL HIGH (ref 8–23)
CO2: 26 mmol/L (ref 22–32)
Calcium: 7.6 mg/dL — ABNORMAL LOW (ref 8.9–10.3)
Chloride: 92 mmol/L — ABNORMAL LOW (ref 98–111)
Creatinine, Ser: 2.41 mg/dL — ABNORMAL HIGH (ref 0.44–1.00)
GFR, Estimated: 20 mL/min — ABNORMAL LOW (ref >60)
Glucose, Bld: 298 mg/dL — ABNORMAL HIGH (ref 70–99)
Potassium: 3.5 mmol/L (ref 3.5–5.1)
Sodium: 128 mmol/L — ABNORMAL LOW (ref 135–145)

## 2022-08-04 LAB — PHOSPHORUS: Phosphorus: 2.5 mg/dL (ref 2.5–4.6)

## 2022-08-04 LAB — MAGNESIUM
Magnesium: 1.9 mg/dL (ref 1.7–2.4)
Magnesium: 2.1 mg/dL (ref 1.7–2.4)

## 2022-08-04 LAB — PROTIME-INR
INR: 4.3 (ref 0.8–1.2)
Prothrombin Time: 41.6 seconds — ABNORMAL HIGH (ref 11.4–15.2)

## 2022-08-04 MED ORDER — AMIODARONE LOAD VIA INFUSION
150.0000 mg | Freq: Once | INTRAVENOUS | Status: AC
  Administered 2022-08-04: 150 mg via INTRAVENOUS
  Filled 2022-08-04: qty 83.34

## 2022-08-04 MED ORDER — INSULIN ASPART PROT & ASPART (70-30 MIX) 100 UNIT/ML ~~LOC~~ SUSP: 10.0000 [IU] | Freq: Every morning | SUBCUTANEOUS | Status: DC

## 2022-08-04 MED ORDER — FUROSEMIDE 40 MG PO TABS: 40.0000 mg | ORAL_TABLET | Freq: Every day | ORAL | Status: DC | PRN

## 2022-08-04 MED ORDER — SODIUM CHLORIDE 0.9% FLUSH
3.0000 mL | Freq: Two times a day (BID) | INTRAVENOUS | Status: DC
  Administered 2022-08-06 – 2022-08-12 (×12): 3 mL via INTRAVENOUS

## 2022-08-04 MED ORDER — LINAGLIPTIN 5 MG PO TABS
5.0000 mg | ORAL_TABLET | Freq: Every day | ORAL | Status: DC
  Administered 2022-08-04 – 2022-08-12 (×9): 5 mg via ORAL
  Filled 2022-08-04 (×9): qty 1

## 2022-08-04 MED ORDER — SODIUM CHLORIDE 0.9 % IV SOLN: 250.0000 mL | INTRAVENOUS | Status: DC | PRN

## 2022-08-04 MED ORDER — AMIODARONE HCL IN DEXTROSE 360-4.14 MG/200ML-% IV SOLN
30.0000 mg/h | INTRAVENOUS | Status: DC
  Administered 2022-08-05 (×2): 30 mg/h via INTRAVENOUS
  Filled 2022-08-04 (×3): qty 200

## 2022-08-04 MED ORDER — SODIUM CHLORIDE 0.9% FLUSH: 3.0000 mL | INTRAVENOUS | Status: DC | PRN

## 2022-08-04 MED ORDER — METOPROLOL SUCCINATE ER 50 MG PO TB24
50.0000 mg | ORAL_TABLET | Freq: Every day | ORAL | Status: DC
  Administered 2022-08-04 – 2022-08-12 (×8): 50 mg via ORAL
  Filled 2022-08-04: qty 1
  Filled 2022-08-04: qty 2
  Filled 2022-08-04 (×8): qty 1

## 2022-08-04 MED ORDER — FUROSEMIDE 10 MG/ML IJ SOLN
60.0000 mg | Freq: Once | INTRAMUSCULAR | Status: AC
  Administered 2022-08-04: 60 mg via INTRAVENOUS
  Filled 2022-08-04: qty 6

## 2022-08-04 MED ORDER — ACETAMINOPHEN 325 MG PO TABS
650.0000 mg | ORAL_TABLET | ORAL | Status: DC | PRN
  Administered 2022-08-06: 650 mg via ORAL
  Filled 2022-08-04: qty 2

## 2022-08-04 MED ORDER — INSULIN ASPART PROT & ASPART (70-30 MIX) 100 UNIT/ML ~~LOC~~ SUSP: 5.0000 [IU] | Freq: Every evening | SUBCUTANEOUS | Status: DC

## 2022-08-04 MED ORDER — FUROSEMIDE 10 MG/ML IJ SOLN: 60.0000 mg | Freq: Two times a day (BID) | INTRAMUSCULAR | Status: DC

## 2022-08-04 MED ORDER — AMIODARONE HCL IN DEXTROSE 360-4.14 MG/200ML-% IV SOLN
60.0000 mg/h | INTRAVENOUS | Status: DC
  Administered 2022-08-04 (×2): 60 mg/h via INTRAVENOUS
  Filled 2022-08-04 (×2): qty 200

## 2022-08-04 MED ORDER — ONDANSETRON HCL 4 MG/2ML IJ SOLN
4.0000 mg | Freq: Four times a day (QID) | INTRAMUSCULAR | Status: DC | PRN
  Administered 2022-08-07: 4 mg via INTRAVENOUS
  Filled 2022-08-04 (×2): qty 2

## 2022-08-04 MED ORDER — POTASSIUM CHLORIDE CRYS ER 20 MEQ PO TBCR
40.0000 meq | EXTENDED_RELEASE_TABLET | Freq: Once | ORAL | Status: AC
  Administered 2022-08-04: 40 meq via ORAL
  Filled 2022-08-04: qty 2

## 2022-08-04 NOTE — ED Notes (Signed)
Husband at bedside, updated on delay for admission awaiting bed assignment.  Pt denies chest pain at this time.  HR increases with getting out of bed to bedside commode, denies chest pain/shortness of breath, returns to baseline once at rest.

## 2022-08-04 NOTE — H&P (Signed)
History and Physical    [name] [name] ZOX:[id number] DOB: November 26, 1940 DOA: 08/03/2022  PCP: [name], MD (Confirm with patient/family/NH records and if not entered, this has to be entered at [facility] point of entry) Patient coming from: Home  I have personally briefly reviewed patient's old medical records in [facility]  Chief Complaint: Palpitations  HPI: [name] [name] is a 82 y.o. female with medical history significant of PAF on Coumadin, ischemic cardiomyopathy with LVEF 45-50%, chronic HFrEF, CKD stage IV, IDDM with insulin resistance, seizure disorder, blindness of left eye recent ESBL UTI, presented with rapid A-fib and worsening of LVEF.  Patient reported episode of palpitations recently resolved by itself.  Yesterday she went to see cardiology for routine visit was found to be in rapid A-fib and sent to ED.  She denies any chest pain shortness of breath. ED Course: At [facility] ED, heart rate 160s, and appears to have fluid overload and patient was given IV Lasix and cardiology started patient on amiodarone drip.  Bedside echocardiogram showed LVEF 30%.  BMP 1000, troponin 9.  Sodium 127  Review of Systems: As per HPI otherwise 14 point review of systems negative.    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   HYPERLIPIDEMIA 03/27/2006   HYPERTENSION 03/27/2006   Intertrochanteric fracture of right hip (HCC) 07/13/2012   LUNG NODULE 09/01/2006   Excision, Bx Benighn   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"    Osteopenia 2004   Dexa 2004 showed Osteopenia, DEXA 03/2007 normal   Osteopenia    Other chronic cystitis with hematuria    Pelvic fracture (HCC) 06/16/2016   S/P fall   Recurrent urinary tract infection    Seeing Urology   RETINOPATHY, BACKGROUND NOS 03/27/2006   Seizures (HCC)    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    Past Surgical History:  Procedure Laterality Date   ANKLE FRACTURE SURGERY Bilateral 08/2015   "right one was in 2 places"   CARDIAC CATHETERIZATION N/A 05/17/2015   Procedure: Left Heart Cath and Coronary Angiography;  Surgeon: Corky Crafts, MD;  Location: [facility] INVASIVE CV LAB;  Service: Cardiovascular;  Laterality: N/A;   CARDIAC CATHETERIZATION N/A 05/17/2015   Procedure: Coronary Balloon Angioplasty;  Surgeon: Corky Crafts, MD;  Location: [facility] INVASIVE CV LAB;  Service: Cardiovascular;  Laterality: N/A;   FEMUR IM NAIL Right 07/15/2012   Procedure: INTRAMEDULLARY (IM) NAIL HIP;  Surgeon: Eulas Post, MD;  Location: [facility] OR;  Service: Orthopedics;  Laterality: Right;   FEMUR IM NAIL Left 12/02/2015   Procedure: INTRAMEDULLARY (IM) NAIL FEMORAL;  Surgeon: Eldred Manges, MD;  Location: [facility] OR;  Service: Orthopedics;  Laterality: Left;   FRACTURE SURGERY     INCISION AND DRAINAGE HIP Left 01/03/2016   Procedure: IRRIGATION AND DEBRIDEMENT HIP;  Surgeon: Eldred Manges, MD;  Location: [facility] OR;  Service: Orthopedics;  Laterality: Left;   SHOULDER SURGERY Bilateral    "don't  remember which side"     reports that she quit smoking about 44 years ago. Her smoking use included cigarettes. She has never used smokeless tobacco. She reports that she does not drink alcohol and does not use drugs.  Allergies  Allergen Reactions   Procaine Hcl Anaphylaxis   Amoxicillin Itching    Family History  Problem Relation Age of Onset   Diabetes Father    Heart attack Father 75   Diabetes Sister    Drug abuse Son    Cancer Neg Hx        no hx of colon or breast cancer     Prior to  Admission medications   Medication Sig Start Date End Date Taking? Authorizing Provider  Apoaequorin (PREVAGEN PO) Take 1 tablet by mouth daily.    [provider]  atorvastatin (LIPITOR) 80 MG tablet Take 1 tablet by mouth once daily 10/01/21   [name], MD  ezetimibe (ZETIA) 10 MG tablet Take 1 tablet (10 mg total) by mouth daily. 12/04/21   Chilton Si, MD  furosemide (LASIX) 40 MG tablet Take 1 tablet (40 mg total) by mouth daily as needed for fluid or edema. 07/23/22   Lorin Glass, MD  insulin NPH-regular Human (NOVOLIN 70/30 RELION) (70-30) 100 UNIT/ML injection 10 units in the morning and 5 units at night. Patient taking differently: 23 units in the morning and 5 units at night. 07/23/22   Dahal, Melina Schools, MD  metoprolol succinate (TOPROL-XL) 50 MG 24 hr tablet TAKE 1 TABLET BY MOUTH ONCE DAILY . APPOINTMENT REQUIRED FOR FUTURE REFILLS Patient taking differently: Take 50 mg by mouth daily. 06/15/22   Chilton Si, MD  Multiple Vitamins-Minerals (MULTIVITAMIN ADULT PO) Take 1 tablet by mouth daily with breakfast.     [provider]  VITAMIN D, CHOLECALCIFEROL, PO Take 1 tablet by mouth daily.    [provider]  warfarin (COUMADIN) 2.5 MG tablet Take 0.5 tablets (1.25 mg total) by mouth daily. 07/10/22   Dorcas Carrow, MD    Physical Exam: Vitals:   08/04/22 0930 08/04/22 1041 08/04/22 1100 08/04/22 1534  BP: 110/75  124/76 114/80  Pulse: (!) 113   (!) 114  Resp: 10  18 18   Temp:   97.9 F (36.6 C) 97.9 F (36.6 C)  TempSrc:   Oral Oral  SpO2: 96%  97% 100%  Weight:  72.9 kg    Height:  5\' 9"  (1.753 m)      Constitutional: NAD, calm, comfortable Vitals:   08/04/22 0930 08/04/22 1041 08/04/22 1100 08/04/22 1534  BP: 110/75  124/76 114/80  Pulse: (!) 113   (!) 114  Resp: 10  18 18   Temp:   97.9 F (36.6 C) 97.9 F (36.6 C)  TempSrc:   Oral Oral  SpO2: 96%  97% 100%  Weight:  72.9 kg    Height:  5\' 9"  (1.753 m)     Eyes: PERRL, lids  and conjunctivae normal ENMT: Mucous membranes are moist. Posterior pharynx clear of any exudate or lesions.Normal dentition.  Neck: normal, supple, no masses, no thyromegaly Respiratory: clear to auscultation bilaterally, no wheezing, no crackles. Normal respiratory effort. No accessory muscle use.  Cardiovascular: Irregular heart rate, no murmurs / rubs / gallops. No extremity edema. 2+ pedal pulses. No carotid bruits.  Abdomen: no tenderness, no masses palpated. No hepatosplenomegaly. Bowel sounds positive.  Musculoskeletal: no clubbing / cyanosis. No joint deformity upper and lower extremities. Good ROM, no contractures. Normal muscle tone.  Skin: no rashes, lesions, ulcers. No induration Neurologic: CN 2-12 grossly intact. Sensation intact, DTR normal. Strength 5/5 in all 4.  Psychiatric: Normal judgment and insight. Alert and oriented x 3. Normal mood.    Labs on Admission: I have personally reviewed following labs and imaging studies  CBC: Recent Labs  Lab 08/03/22 1655  WBC 14.6*  NEUTROABS 10.8*  HGB 9.3*  HCT 28.9*  MCV 89.2  PLT 662*   Basic Metabolic Panel: Recent Labs  Lab 08/03/22 1655 08/04/22 0001 08/04/22 0541  NA 127* 128*  --   K 3.7 3.5  --   CL 89* 92*  --   CO2 25 26  --   GLUCOSE 313* 298*  --   BUN 29* 29*  --   CREATININE 2.41* 2.41*  --   CALCIUM 7.9* 7.6*  --   MG  --   --  1.9   GFR: Estimated Creatinine Clearance: 19.5 mL/min (A) (by C-G formula based on SCr of 2.41 mg/dL (H)). Liver Function Tests: Recent Labs  Lab 08/03/22 1655  AST 51*  ALT 21  ALKPHOS 101  BILITOT 0.3  PROT 7.4  ALBUMIN 2.1*   Recent Labs  Lab 08/03/22 1655  LIPASE 21   No results for input(s): "AMMONIA" in the last 168 hours. Coagulation Profile: Recent Labs  Lab 08/03/22 1708 08/04/22 0541  INR 3.9* 4.3*   Cardiac Enzymes: No results for input(s): "CKTOTAL", "CKMB", "CKMBINDEX", "TROPONINI" in the last 168 hours. BNP (last 3 results) No results  for input(s): "PROBNP" in the last 8760 hours. HbA1C: No results for input(s): "HGBA1C" in the last 72 hours. CBG: Recent Labs  Lab 08/04/22 0004 08/04/22 0716 08/04/22 1124 08/04/22 1603 08/04/22 1752  GLUCAP 251* 282* 227* 77 101*   Lipid Profile: No results for input(s): "CHOL", "HDL", "LDLCALC", "TRIG", "CHOLHDL", "LDLDIRECT" in the last 72 hours. Thyroid Function Tests: Recent Labs    08/04/22 1518  TSH 0.715   Anemia Panel: No results for input(s): "VITAMINB12", "FOLATE", "FERRITIN", "TIBC", "IRON", "RETICCTPCT" in the last 72 hours. Urine analysis:    Component Value Date/Time   COLORURINE YELLOW 07/20/2022 1310   APPEARANCEUR HAZY (A) 07/20/2022 1310   LABSPEC 1.007 07/20/2022 1310   PHURINE 6.0 07/20/2022 1310   GLUCOSEU >=500 (A) 07/20/2022 1310   GLUCOSEU NEGATIVE 05/06/2022 1103   HGBUR MODERATE (A) 07/20/2022 1310   BILIRUBINUR NEGATIVE 07/20/2022 1310   BILIRUBINUR neg 05/16/2015 1928   KETONESUR 5 (A) 07/20/2022 1310   PROTEINUR 30 (A) 07/20/2022 1310   UROBILINOGEN 0.2 05/06/2022 1103   NITRITE NEGATIVE 07/20/2022 1310   LEUKOCYTESUR LARGE (A) 07/20/2022 1310    Radiological Exams on Admission: DG Chest Portable 1 View  Result Date: 08/03/2022 CLINICAL DATA:  Epigastric pain with increased pallor and weakness EXAM: PORTABLE CHEST 1 VIEW COMPARISON:  Chest radiograph dated 07/20/2022 FINDINGS: Normal lung volumes. No focal consolidations. No pleural effusion or pneumothorax. Enlarged cardiomediastinal silhouette is likely projectional. Old posterior right sixth rib fracture. IMPRESSION: 1. No active cardiopulmonary disease. 2. Enlarged cardiomediastinal silhouette is likely projectional. Electronically Signed   By: Agustin Cree M.D.   On: 08/03/2022 17:33    EKG: Independently reviewed.  A flutter with 3-1 AV block  Assessment/Plan Principal Problem:   Atrial fibrillation with rapid ventricular response (HCC) Active Problems:   Acute systolic CHF  (congestive heart failure) (HCC)   Cardiomyopathy (HCC)   CKD (chronic kidney disease) stage 4, GFR 15-29 ml/min (HCC)   A-fib (HCC)  (please  populate well all problems here in Problem List. (For example, if patient is on BP meds at home and you resume or decide to hold them, it is a problem that needs to be her. Same for CAD, COPD, HLD and so on)  Atrial flutter with RVR -With worsening of LVEF, cardiology started patient on amiodarone -INR= 4.3 today, hold off Coumadin, consult pharmacy to redose Coumadin tomorrow -1 dose of potassium 40 mg went given, check magnesium and phosphorus level -Continue metoprolol 50 mg daily  Acute on chronic HFrEF decompensation -Was found to be fluid overload initially on arrival at ED, received 60 mg IV Lasix.  Right now appears to be euvolemic, hold off diuresis, reevaluate tomorrow.  Leukocytosis -No clear etiology denies any cough shortness of breath fever or chills, no dysuria.  Noted patient was treated for UTI recently completed Bactrim on May 25. -Send UA. -Hold off antibiotics right now  Hypokalemia -P.o. replacement, recheck level tomorrow  Coumadin induced coagulopathy -No overt symptoms signs of bleeding, hold off Coumadin today  IDDM with insulin resistance -Labile glucose control, was hypoglycemic this afternoon.  Will hold off 70/30 -Continue sliding scale ordered by cardiology -Add Januvia  Hyponatremia -Chronic, probably secondary to CHF, 1 dose of Lasix given, recheck sodium level tomorrow  CKD stage IV -Euvolemic-mild fluid overload, further diuresis as per cardiology  Chronic anemia secondary to -H&H stable, no symptoms or signs of active bleeding.    DVT prophylaxis: INR=4.3 Code Status: Full code Family Communication: None at bedside Disposition Plan: Patient is sick with breakthrough A-fib and acute CHF decompensation requiring inpatient amlodipine loading for 48 hours and IV diuresis, expect more than 2 midnight  hospital stay Consults called: Cardiology Admission status: Telemetry admission   Emeline General MD [facility] Pager [phone] 08/04/2022, 7:16 PM

## 2022-08-04 NOTE — Progress Notes (Signed)
Patient's grandson Felipa Emory) called for update. Update given with patient's consent. All questions answered.

## 2022-08-04 NOTE — Assessment & Plan Note (Signed)
Acute/severe pain at the epigastrium/chest. The patient is here for two hospital follow-ups however her main symptom is weakness, severe pain pain at the chest and epigastric area as described above. CT chest abdomen and pelvis done 07/20/2022: L hydroureter on nephrosis, cholelithiasis.  No acute findings in the chest. She seems very uncomfortable.   At home, she is not eating, unable to ambulate. EKG: Unable to proceed, she is sitting in a chair and we only got artifact. CAD, CHF: See above, complaining of pain, no evidence of volume overload. Recent hemorrhagic UTI: Status post antibiotics, reports normal-appearing urine. Anemia: Last hemoglobin 9, she is anticoagulated, looks very pale today.  No hematemesis, no change in the color of the stools. DM: Was discharged home with a reduced dose of insulin but sugars were elevated at home so she decided to go back on 70/30 insulin 23 units a.m., 5 units p.m. CBG -286.  (Has not eaten today) Plan: Refer to the ER for further evaluation and treatment.  Appreciate ER MD accepted the patient.

## 2022-08-04 NOTE — Progress Notes (Signed)
Order(s) created erroneously. Erroneous order ID: [id number]  Order moved by: Ian Malkin  Order move date/time: 08/04/2022 8:31 AM  Source Patient: [id number]  Source Contact: 08/03/2022  Destination Patient: [id number]  Destination Contact: 05/17/2012

## 2022-08-04 NOTE — H&P (View-Only) (Signed)
Cardiology Consultation   Patient ID: [name] [name] MRN: [id number]; DOB: 1941-01-11  Admit date: 08/03/2022 Date of Consult: 08/04/2022  PCP:  [name] Plump, MD   [facility] Providers Cardiologist:  [name] Si, MD   {   Patient Profile:   [name] [name] is a 82 y.o. 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  History of Present Illness:   Ms. [name] [name] with above PMH presented to [facility] ER yesterday for chest pain. She is not a good historian, frequently bring up off topic, states her memory is bad since her brain problems 11 years ago. She offers numerous complaints.  She states she had chest pain since 2021. She has multiple episodes of sudden onset mid-sternum chest pain that is crushing and sharp in quality. Episodes would occur randomly, without exertion. She states she had major episodes 5 times since 2021, that she would hurt severely; where she would have minor episodes that occur frequently. She states her pain is resolved with pain meds at ED. She states she would get SOB, dizziness, feeling pre-syncopal, nausea with emesis with these episodes. She recalls having some emesis yesterday. She states she had A fib over 10 years, typically has no symptoms and not aware of it, but felt these chest pain maybe related. She states her PCP Dr Drue Novel would know. She states she was independent with ADLs 1 month ago, but since her UTIs she has been increasingly weak, unable to ambulate, had a lot falls and fractures where she would hurt significantly. She also reports  passing out 1 week ago where her husband and EMS was called while she was walking to the car. She eats a lot green leafy vegetables  at baseline. She does not what meds she is on, states her husband handles this. She denied any bleeding. She reports weight loss over the past 10 years.   Called her husband [name] [name], no answer at this time for collateral history.   Per chart review, she had most recent hospitalization here from 07/20/22-07/23/22 for hemorrhagic ESBL producing Klebsiella and E coli UTI, suffered AKI on CKD IV. CT showed small nonobstructing left renal calculus with mild left hydroureteronephrosis, 8 mm calculus in the urinary bladder.  She was treated with meropenem>>bactrim, IVF, and discharged with Foley. Her lasix 40mg  daily was held and resumed as PRN dosing at the time of discharge. Cr was down to 1.96.   She visited PCP yesterday, c/o years of chest/epigastric pain that became worse 08/02/22. She had emesis x2. She was very weak and can't ambulate. She was urinating normally without hematuria since her discharge. Due to significant crushing pain she describes, she was sent to ER for evaluation. At ED she was noted in atrial flutter RVR up to 160s and also felt volume overloaded on exam, she was given IV lasix and amiodarone bolus. Bedside Echo at ED showed LVEF 30%. She was transferred to [facility] today for further management, cardiology is consulted for further input.   Admission diagnotic so far showed hyponatremia 127, glucose 313, BUN 29, Cr elevated to 2.41, albumin 2.1, AST  51, GFR 20.  Hs trop 9 >9. BNP 1094. WBC elevated 14600. Hgb 9.3. PLT 662k. INR 3.9 >4.3. Mag 1.9. CXR showed no acute finding. EKG from yesterday and today showed atrial flutter RVR.    Historically she follows Dr Duke Salvia from cardiology, suffered NSTEMI 05/2015. Cardiac catheterization at that time showed 100% stenosis of OM1 which was treated with balloon angioplasty. No stent was placed. At the end of the procedure,  initial occlusion site had no residual stenosis; however, there did remain a large branch downstream which remained occluded and did not respond to angioplasty. Echo 05/10/15 showed LVEF of 35-40% with diffuse hypokinesis, mild MR, mod TR, normal RV, mildly to moderately increase PASP with PA peak pressure of 45 mmHg. She was treated with ASA and Plavix for a year and now maintained on ASA 81mg  daily. She was on metoprolol XL 25mg  daily at one point and this was stopped due to bradycardia 40s.   She had chest discomfort where she underwent stress myoview 11/19/20 showed no ischemia but prior MI. Echo from 11/06/20 showed LVEF improved to 45-50%, grade I DD, severe hypokinesis of the left ventricular, mid-apical inferolateral wall, normal RV, trivial MR. She has long standing history of paroxysmal A fib dating back to 2017, historically on coumadin and metoprolol. She was referred to Dr Lalla Brothers 11/21/20, felt chest pain is non-specific to A fib, subsequent 3 days Zio monitor showed no sustained arrhythmias, HR 45-148 bpm, average 56 bpm, one 4 beats NSVT, 26 episodes of SVT with longest lasting 13 beats, rare supraventricular ectopy and ventricular ectopy.   She last saw Dr Duke Salvia 10/29/21, c/o not able to walk well with frequent falls, appeared euvolemic on exam, and did not want to start any new medical therapy. EKG showed sinus rhythm that day. Per office note she was continued on ASA/Plavix, metoprolol, zetia, lipitor for CAD/PAF/Chronic systolic heart failure.       Past Medical History:  Diagnosis Date   Atrial fibrillation (HCC)    SVT dx 2007, cath 2007 mild CAD, then had an cardioversion, ablation; still on coumadin , has occ palpitation, EKG 03-2010 NSR   Blindness of left eye    CKD (chronic kidney disease), stage III (HCC) 06/16/2016   [name] 06/17/2016   DIABETES MELLITUS, TYPE II 03/27/2006   dr Everardo All   Eye muscle weakness    Right eye weakness after cataract surgery   GERD  (gastroesophageal reflux disease) 10/05/2011   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   HYPERLIPIDEMIA 03/27/2006   HYPERTENSION 03/27/2006   Intertrochanteric fracture of right hip (HCC) 07/13/2012   LUNG NODULE 09/01/2006   Excision, Bx Benighn   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"   Osteopenia 2004   Dexa 2004 showed Osteopenia, DEXA 03/2007 normal   Osteopenia    Other chronic cystitis with hematuria    Pelvic fracture (HCC) 06/16/2016   S/P fall   Recurrent urinary tract infection    Seeing Urology   RETINOPATHY, 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   Seizures (HCC)    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    Past Surgical History:  Procedure Laterality Date   ANKLE FRACTURE SURGERY Bilateral 08/2015   "right one was in 2 places"   CARDIAC CATHETERIZATION N/A 05/17/2015   Procedure: Left Heart Cath and Coronary Angiography;  Surgeon: Corky Crafts, MD;  Location: [facility] INVASIVE CV LAB;  Service: Cardiovascular;  Laterality: N/A;   CARDIAC CATHETERIZATION N/A 05/17/2015   Procedure: Coronary Balloon Angioplasty;  Surgeon:  Corky Crafts, MD;  Location: [facility] INVASIVE CV LAB;  Service: Cardiovascular;  Laterality: N/A;   FEMUR IM NAIL Right 07/15/2012   Procedure: INTRAMEDULLARY (IM) NAIL HIP;  Surgeon: Eulas Post, MD;  Location: [facility] OR;  Service: Orthopedics;  Laterality: Right;   FEMUR IM NAIL Left 12/02/2015   Procedure: INTRAMEDULLARY (IM) NAIL FEMORAL;  Surgeon: Eldred Manges, MD;  Location: [facility] OR;  Service: Orthopedics;  Laterality: Left;   FRACTURE SURGERY     INCISION AND DRAINAGE HIP Left 01/03/2016   Procedure: IRRIGATION AND DEBRIDEMENT HIP;  Surgeon: Eldred Manges, MD;  Location: [facility] OR;  Service: Orthopedics;  Laterality: Left;   SHOULDER SURGERY Bilateral    "don't remember which side"     Home Medications:  Prior to Admission medications   Medication Sig Start Date End Date Taking? Authorizing Provider  Apoaequorin (PREVAGEN PO) Take 1 tablet by mouth daily.    [provider]  atorvastatin (LIPITOR) 80 MG tablet Take 1 tablet by mouth once daily 10/01/21   [name] Plump, MD  ezetimibe (ZETIA) 10 MG tablet Take 1 tablet (10 mg total) by mouth daily. 12/04/21   [name] Si, MD  furosemide (LASIX) 40 MG tablet Take 1 tablet (40 mg total) by mouth daily as needed for fluid or edema. 07/23/22   Lorin Glass, MD  insulin NPH-regular Human (NOVOLIN 70/30 RELION) (70-30) 100 UNIT/ML injection 10 units in the morning and 5 units at night. Patient taking differently: 23 units in the morning and 5 units at night. 07/23/22   Dahal, Melina Schools, MD  metoprolol succinate (TOPROL-XL) 50 MG 24 hr tablet TAKE 1 TABLET BY MOUTH ONCE DAILY . APPOINTMENT REQUIRED FOR FUTURE REFILLS Patient taking differently: Take 50 mg by mouth daily. 06/15/22   [name] Si, MD  Multiple Vitamins-Minerals (MULTIVITAMIN ADULT PO) Take 1 tablet by mouth daily with breakfast.     [provider]  VITAMIN D, CHOLECALCIFEROL, PO Take 1 tablet by mouth daily.    [provider]  warfarin (COUMADIN) 2.5 MG tablet Take 0.5 tablets (1.25 mg total) by mouth daily. 07/10/22   Dorcas Carrow, MD    Inpatient Medications: Scheduled Meds:  amiodarone  150 mg Intravenous Once   atorvastatin  80 mg Oral Daily   ezetimibe  10 mg Oral Daily   furosemide  60 mg Intravenous Once   insulin aspart  0-15 Units Subcutaneous TID WC   insulin aspart  0-5 Units Subcutaneous QHS   insulin aspart protamine- aspart  10 Units Subcutaneous Q breakfast   insulin aspart protamine- aspart  5 Units Subcutaneous Q supper   metoprolol succinate  50 mg Oral Daily   Warfarin - Pharmacist Dosing Inpatient   Does not apply q1600   Continuous Infusions:  sodium chloride Stopped (08/03/22 1823)   amiodarone     Followed by   amiodarone     PRN Meds: sodium chloride  Allergies:    Allergies  Allergen Reactions   Procaine Hcl Anaphylaxis   Amoxicillin Itching    Social History:   Social History    Socioeconomic History   Marital status: Married    Spouse name: [name]   Number of children: 1   Years of education: College   Highest education level: Not on file  Occupational History   Occupation: retired     Associate Professor: RETIRED  Tobacco Use   Smoking status: Former    Types: Cigarettes    Quit date: 03/02/1978    Years  since quitting: 44.4   Smokeless tobacco: Never  Vaping Use   Vaping Use: Never used  Substance and Sexual Activity   Alcohol use: No    Alcohol/week: 0.0 standard drinks of alcohol   Drug use: No   Sexual activity: Not on file  Other Topics Concern   Not on file  Social History Narrative   Patient lives at home spouse. Uses a walker consistently.   Moved from [place] 2004   1 son, problems w/ drugs, passed away 02-20-16-- OD       Social Determinants of Health   Financial Resource Strain: Low Risk  (08/22/2020)   Overall Financial Resource Strain (CARDIA)    Difficulty of Paying Living Expenses: Not hard at all  Food Insecurity: No Food Insecurity (07/24/2022)   Hunger Vital Sign    Worried About Running Out of Food in the Last Year: Never true    Ran Out of Food in the Last Year: Never true  Transportation Needs: No Transportation Needs (07/24/2022)   PRAPARE - Administrator, Civil Service (Medical): No    Lack of Transportation (Non-Medical): No  Physical Activity: Inactive (08/22/2020)   Exercise Vital Sign    Days of Exercise per Week: 0 days    Minutes of Exercise per Session: 0 min  Stress: No Stress Concern Present (08/22/2020)   [employer] of Occupational Health - Occupational Stress Questionnaire    Feeling of Stress : Not at all  Social Connections: Moderately Integrated (08/22/2020)   Social Connection and Isolation Panel [NHANES]    Frequency of Communication with Friends and Family: More than three times a week    Frequency of Social Gatherings with Friends and Family: More than three times a week    Attends Religious  Services: More than 4 times per year    Active Member of [employer] or Organizations: No    Attends Banker Meetings: Never    Marital Status: Married  Catering manager Violence: Not At Risk (07/20/2022)   Humiliation, Afraid, Rape, and Kick questionnaire    Fear of Current or Ex-Partner: No    Emotionally Abused: No    Physically Abused: No    Sexually Abused: No    Family History:    Family History  Problem Relation Age of Onset   Diabetes Father    Heart attack Father 29   Diabetes Sister    Drug abuse Son    Cancer Neg Hx        no hx of colon or breast cancer     ROS:  Constitutional: Denied fever, chills, malaise, night sweats Eyes: Denied vision change or loss Ears/Nose/Mouth/Throat: Denied ear ache, sore throat, coughing, sinus pain Cardiovascular:see HPI  Respiratory: see HPI  Gastrointestinal: see HPI  Genital/Urinary: Denied dysuria, hematuria, urinary frequency/urgency Musculoskeletal: weakness, decondition  Skin: Denied rash, wound Neuro: Denied headache, dizziness, syncope Psych: Denied history of depression/anxiety  Endocrine: history of diabetes   Physical Exam/Data:   Vitals:   08/04/22 0930 08/04/22 1041 08/04/22 1100 08/04/22 1534  BP: 110/75  124/76 114/80  Pulse: (!) 113   (!) 114  Resp: 10  18 18   Temp:   97.9 F (36.6 C) 97.9 F (36.6 C)  TempSrc:   Oral Oral  SpO2: 96%  97% 100%  Weight:  72.9 kg    Height:  5\' 9"  (1.753 m)      Intake/Output Summary (Last 24 hours) at 08/04/2022 1600 Last data filed at 08/04/2022 1610  Gross per 24 hour  Intake 228.57 ml  Output 700 ml  Net -471.43 ml      08/04/2022   10:41 AM 08/03/2022    5:06 PM 07/20/2022    3:52 PM  Last 3 Weights  Weight (lbs) 160 lb 11.5 oz 161 lb 160 lb 11.5 oz  Weight (kg) 72.9 kg 73.029 kg 72.9 kg     Body mass index is 23.73 kg/m.  Vitals:  Vitals:   08/04/22 1100 08/04/22 1534  BP: 124/76 114/80  Pulse:  (!) 114  Resp: 18 18  Temp: 97.9 F (36.6 C) 97.9  F (36.6 C)  SpO2: 97% 100%   General Appearance: In no apparent distress, laying in bed HEENT: Normocephalic, atraumatic. Neck: Supple, trachea midline, no JVDs Cardiovascular: Irregular,  normal S1-S2,  no murmur Respiratory: Resting breathing unlabored, lungs sounds clear but diminished at base to auscultation bilaterally, no use of accessory muscles. On room air.  Speaks full sentence  Gastrointestinal: Bowel sounds positive, abdomen soft, non-tender Extremities: Able to move all extremities in bed without difficulty, no edema of BLE  Musculoskeletal: Normal muscle bulk and tone Skin: Intact, warm, dry. Some petechiae noted in arms  Neurologic: Alert, oriented to person, place and time. Memory deficit  Psychiatric: Mild irritation    EKG:  The EKG was personally reviewed and demonstrates:    EKG from 08/03/22 showed atrial flutter RVR 125bpm,  2:1  EKG from 08/04/22 showed atrial flutter RVR 105 bpm, variable AVB  Telemetry:  Telemetry was personally reviewed and demonstrates:    Atrial flutter with ventricular rate of 90-100s, variable AVB   Relevant CV Studies:   Zio 12/07/20:  HR 45 - 148bpm, average 56bpm. 1 NSVT lasting 4 beats. 26 SVT, longest lasting 13 beats. Rare supraventricular ectopy and ventricular ectopy. No sustained arrhythmias.   Stress Myoview 11/19/20:    Findings are consistent with prior myocardial infarction. The study is low risk.   No ST deviation was noted.   LV perfusion is abnormal. There is no evidence of ischemia. There is evidence of infarction. Defect 1: There is a medium defect with severe reduction in uptake present in the apical to mid inferolateral location(s) that is fixed. There is normal wall motion in the defect area. Consistent with infarction.   Left ventricular function is normal.   Prior study available for comparison from 03/05/2005. There are changes compared to prior study which appear to be new. The left ventricular ejection  fraction has normalized.   Anterior perfusion defect that is present at rest but not stress, consistent with breast attenuation artifact. Basal to mid inferolateral perfusion defect at rest and stress consistent with prior infarct in the LCX region.  There is no ischemia and wall motion is normal in this region.   Overall, low risk study.   Echo from 11/06/20:   1. Left ventricular ejection fraction, by estimation, is 45 to 50%. The  left ventricle has mildly decreased function. The left ventricle  demonstrates regional wall motion abnormalities (see scoring  diagram/findings for description). Left ventricular  diastolic parameters are consistent with Grade I diastolic dysfunction  (impaired relaxation). There is severe hypokinesis of the left  ventricular, mid-apical inferolateral wall.   2. Right ventricular systolic function is normal. The right ventricular  size is normal.   3. The mitral valve is rheumatic. Trivial mitral valve regurgitation. No  evidence of mitral stenosis.   4. The aortic valve is tricuspid. Aortic valve regurgitation is not  visualized.  No aortic stenosis is present.   5. The inferior vena cava is normal in size with greater than 50%  respiratory variability, suggesting right atrial pressure of 3 mmHg.   Comparison(s): Prior images unable to be directly viewed, comparison made  by report only.   Laboratory Data:  High Sensitivity Troponin:   Recent Labs  Lab 07/20/22 1011 07/20/22 1310 08/03/22 1655 08/03/22 1904  TROPONINIHS 14 13 9 9      Chemistry Recent Labs  Lab 08/03/22 1655 08/04/22 0001 08/04/22 0541  NA 127* 128*  --   K 3.7 3.5  --   CL 89* 92*  --   CO2 25 26  --   GLUCOSE 313* 298*  --   BUN 29* 29*  --   CREATININE 2.41* 2.41*  --   CALCIUM 7.9* 7.6*  --   MG  --   --  1.9  GFRNONAA 20* 20*  --   ANIONGAP 13 10  --     Recent Labs  Lab 08/03/22 1655  PROT 7.4  ALBUMIN 2.1*  AST 51*  ALT 21  ALKPHOS 101  BILITOT 0.3    Lipids No results for input(s): "CHOL", "TRIG", "HDL", "LABVLDL", "LDLCALC", "CHOLHDL" in the last 168 hours.  Hematology Recent Labs  Lab 08/03/22 1655  WBC 14.6*  RBC 3.24*  HGB 9.3*  HCT 28.9*  MCV 89.2  MCH 28.7  MCHC 32.2  RDW 13.2  PLT 662*   Thyroid No results for input(s): "TSH", "FREET4" in the last 168 hours.  BNP Recent Labs  Lab 08/03/22 1710  BNP 1,094.9*    DDimer No results for input(s): "DDIMER" in the last 168 hours.   Radiology/Studies:  DG Chest Portable 1 View  Result Date: 08/03/2022 CLINICAL DATA:  Epigastric pain with increased pallor and weakness EXAM: PORTABLE CHEST 1 VIEW COMPARISON:  Chest radiograph dated 07/20/2022 FINDINGS: Normal lung volumes. No focal consolidations. No pleural effusion or pneumothorax. Enlarged cardiomediastinal silhouette is likely projectional. Old posterior right sixth rib fracture. IMPRESSION: 1. No active cardiopulmonary disease. 2. Enlarged cardiomediastinal silhouette is likely projectional. Electronically Signed   By: Agustin Cree M.D.   On: 08/03/2022 17:33     Assessment and Plan:   Paroxysmal atrial flutter with RVR - hx of paroxysmal A fib dating back to 2017, now presented with A flutter RVR with variable AVB - diagnostic showed leukocytosis, AKI on CKD IV, acute hyponatremia, chronic anemia, elevated BNP, as well as supra-therapeutic INR 4.3 so far -  consider infectious workup, maybe the culprit of A fib RVR  - will check TSH - compliant with coumadin, pharmacy to dose, trend daily INR, goal 2-3, INR >4 no bleeding so far   - continued on PTA metoprolol XL 50mg , s/p amiodarone bolus, will start IV amiodarone gtt for chemical cardioversion, pending Echo, if not converting, will plan for DCCV   Acute on chronic systolic and diastolic heart failure  Ischemic cardiomyopathy  - LVEF down to 35-40% in 2017 NSTEMI event, improved to 45-50% 2022 - BNP 1094 - CXR no acute  - Lab with AKI on CKD IV, query  cardiorenal, recommend further workup with urine study, renal ultrasound  - clinically mildly hypervolemic , s/p IV Lasix 40mg  x1 at ED, UOP , on IV Lasix 20mg  BID per pirmary team, recommend to give IV lasix 60mg  x1 dose now, trend daily BMP, continue track daily weight and strict I&O - she does not like the food here, discussed the need of low  Na diet   - Echo is pending  - GDMT: continued on PTA toprolol XL 50mg , historically did not want start new meds, not candidate for ARNI/MRA/SGLT2I given advanced renal disease, BP would not tolerate additional hydralazine and nitrate at this time   Chest pain CAD with hx of POBA to OM1 2017  - presented with atypical chest pain occurred since 2021  - Hs trop negative x2 - EKG with atrial flutter RVR - will check Echo - last stress myoview 2022 no sichemia  - pain is not consistent with angina, trop negative, she is also not a candidate for invasive cardiac workup involving contrast use, would favor medical management given her advanced age as well as comorbidity, diuresis to euvolemia, optimize medical therapy  - continue metoprolol XL 50mg  daily, zetia 10mg , lipitor 80mg ; given use of coumadin for A fib and frequent supratherapeutic INR /high risk of bleeding, would favor stop ASA 81mg  daily   HTN - BP low normal on metoprolol and lasix   Leukocytosis  Supra-therapeutic INR Acute hyponatremia  AKI on CKD IV  Type 2 DM with hyperglycemia  HLD Debility Chronic anemia Anxiety  - per primary team    Risk Assessment/Risk Scores:    New York Heart Association (NYHA) Functional Class NYHA Class II  CHA2DS2-VASc Score = 7   This indicates a 11.2% annual risk of stroke. The patient's score is based upon: CHF History: 1 HTN History: 1 Diabetes History: 1 Stroke History: 0 Vascular Disease History: 1 Age Score: 2 Gender Score: 1     For questions or updates, please contact [facility] Please consult [email or web address] for  contact info under    Signed, Cyndi Bender, NP  08/04/2022 4:00 PM

## 2022-08-04 NOTE — Progress Notes (Signed)
ANTICOAGULATION CONSULT NOTE  Pharmacy Consult for warfarin  Indication: atrial fibrillation  Allergies  Allergen Reactions   Procaine Hcl Anaphylaxis   Amoxicillin Itching    Patient Measurements: Height: 5\' 9"  (175.3 cm) Weight: 73 kg (161 lb) IBW/kg (Calculated) : 66.2  Vital Signs: Temp: 97.9 F (36.6 C) (06/04 0719) Temp Source: Oral (06/04 0719) BP: 119/83 (06/04 0719) Pulse Rate: 137 (06/04 0719)  Labs: Recent Labs    08/03/22 1655 08/03/22 1708 08/03/22 1904 08/04/22 0001 08/04/22 0541  HGB 9.3*  --   --   --   --   HCT 28.9*  --   --   --   --   PLT 662*  --   --   --   --   LABPROT  --  38.6*  --   --  41.6*  INR  --  3.9*  --   --  4.3*  CREATININE 2.41*  --   --  2.41*  --   TROPONINIHS 9  --  9  --   --      Estimated Creatinine Clearance: 19.5 mL/min (A) (by C-G formula based on SCr of 2.41 mg/dL (H)).  Assessment: Patient admitted with CC of chest pain, recent admission for AKI. Was found to be in Afib with RVR in ED w fluid overload. She is on chronic warfarin for afib. INR today remains elevated at 4.3. No bleeding noted.   Goal of Therapy:  INR 2-3 Monitor platelets by anticoagulation protocol: Yes   Plan:  No warfarin today Daily INR  Lysle Pearl, PharmD, BCPS, BCEMP Clinical Pharmacist Please see AMION for all pharmacy numbers 08/04/2022 7:47 AM

## 2022-08-04 NOTE — ED Notes (Signed)
Report given to [facility] 

## 2022-08-04 NOTE — Consult Note (Addendum)
 Cardiology Consultation   Patient ID: [name] [name] MRN: [id number]; DOB: 11/03/1940  Admit date: 08/03/2022 Date of Consult: 08/04/2022  PCP:  [name], Jose E, MD   [facility] Providers Cardiologist:  [name] Slayden, MD   {   Patient Profile:   [name] [name] is a 82 y.o. 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  History of Present Illness:   [name] [name] with above PMH presented to [facility] ER yesterday for chest pain. She is not a good historian, frequently bring up off topic, states her memory is bad since her brain problems 11 years ago. She offers numerous complaints.  She states she had chest pain since 2021. She has multiple episodes of sudden onset mid-sternum chest pain that is crushing and sharp in quality. Episodes would occur randomly, without exertion. She states she had major episodes 5 times since 2021, that she would hurt severely; where she would have minor episodes that occur frequently. She states her pain is resolved with pain meds at ED. She states she would get SOB, dizziness, feeling pre-syncopal, nausea with emesis with these episodes. She recalls having some emesis yesterday. She states she had A fib over 10 years, typically has no symptoms and not aware of it, but felt these chest pain maybe related. She states her PCP [name] [name] would know. She states she was independent with ADLs 1 month ago, but since her UTIs she has been increasingly weak, unable to ambulate, had a lot falls and fractures where she would hurt significantly. She also reports  passing out 1 week ago where her husband and EMS was called while she was walking to the car. She eats a lot green leafy vegetables  at baseline. She does not what meds she is on, states her husband handles this. She denied any bleeding. She reports weight loss over the past 10 years.   Called her husband [name] [name], no answer at this time for collateral history.   Per chart review, she had most recent hospitalization here from 07/20/22-07/23/22 for hemorrhagic ESBL producing Klebsiella and [name] coli UTI, suffered AKI on CKD IV. CT showed small nonobstructing left renal calculus with mild left hydroureteronephrosis, 8 mm calculus in the urinary bladder.  She was treated with meropenem>>bactrim, IVF, and discharged with Foley. Her lasix 40mg daily was held and resumed as PRN dosing at the time of discharge. Cr was down to 1.96.   She visited PCP yesterday, c/o years of chest/epigastric pain that became worse 08/02/22. She had emesis x2. She was very weak and can't ambulate. She was urinating normally without hematuria since her discharge. Due to significant crushing pain she describes, she was sent to ER for evaluation. At ED she was noted in atrial flutter RVR up to 160s and also felt volume overloaded on exam, she was given IV lasix and amiodarone bolus. Bedside Echo at ED showed LVEF 30%. She was transferred to [facility] today for further management, cardiology is consulted for further input.   Admission diagnotic so far showed hyponatremia 127, glucose 313, BUN 29, Cr elevated to 2.41, albumin 2.1, AST   51, GFR 20.  Hs trop 9 >9. BNP 1094. WBC elevated 14600. Hgb 9.3. PLT 662k. INR 3.9 >4.3. Mag 1.9. CXR showed no acute finding. EKG from yesterday and today showed atrial flutter RVR.    Historically she follows [name] [name] from cardiology, suffered NSTEMI 05/2015. Cardiac catheterization at that time showed 100% stenosis of OM1 which was treated with balloon angioplasty. No stent was placed. At the end of the procedure,  initial occlusion site had no residual stenosis; however, there did remain a large branch downstream which remained occluded and did not respond to angioplasty. Echo 05/10/15 showed LVEF of 35-40% with diffuse hypokinesis, mild MR, mod TR, normal RV, mildly to moderately increase PASP with PA peak pressure of 45 mmHg. She was treated with ASA and Plavix for a year and now maintained on ASA 81mg daily. She was on metoprolol XL 25mg daily at one point and this was stopped due to bradycardia 40s.   She had chest discomfort where she underwent stress myoview 11/19/20 showed no ischemia but prior MI. Echo from 11/06/20 showed LVEF improved to 45-50%, grade I DD, severe hypokinesis of the left ventricular, mid-apical inferolateral wall, normal RV, trivial MR. She has long standing history of paroxysmal A fib dating back to 2017, historically on coumadin and metoprolol. She was referred to [name] [name] 11/21/20, felt chest pain is non-specific to A fib, subsequent 3 days Zio monitor showed no sustained arrhythmias, HR 45-148 bpm, average 56 bpm, one 4 beats NSVT, 26 episodes of SVT with longest lasting 13 beats, rare supraventricular ectopy and ventricular ectopy.   She last saw [name]  10/29/21, c/o not able to walk well with frequent falls, appeared euvolemic on exam, and did not want to start any new medical therapy. EKG showed sinus rhythm that day. Per office note she was continued on ASA/Plavix, metoprolol, zetia, lipitor for CAD/PAF/Chronic systolic heart failure.       Past Medical History:  Diagnosis Date   Atrial fibrillation (HCC)    SVT dx 2007, cath 2007 mild CAD, then had an cardioversion, ablation; still on coumadin , has occ palpitation, EKG 03-2010 NSR   Blindness of left eye    CKD (chronic kidney disease), stage III (HCC) 06/16/2016   /notes 06/17/2016   DIABETES MELLITUS, TYPE II 03/27/2006   [name] Ellison   Eye muscle weakness    Right eye weakness after cataract surgery   GERD  (gastroesophageal reflux disease) 10/05/2011   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   HYPERLIPIDEMIA 03/27/2006   HYPERTENSION 03/27/2006   Intertrochanteric fracture of right hip (HCC) 07/13/2012   LUNG NODULE 09/01/2006   Excision, Bx Benighn   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"   Osteopenia 2004   Dexa 2004 showed Osteopenia, DEXA 03/2007 normal   Osteopenia    Other chronic cystitis with hematuria    Pelvic fracture (HCC) 06/16/2016   S/P fall   Recurrent urinary tract infection    Seeing Urology   RETINOPATHY, 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   Seizures (HCC)    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    Past Surgical History:  Procedure Laterality Date   ANKLE FRACTURE SURGERY Bilateral 08/2015   "right one was in 2 places"   CARDIAC CATHETERIZATION N/A 05/17/2015   Procedure: Left Heart Cath and Coronary Angiography;  Surgeon: Jayadeep S Varanasi, MD;  Location: [facility]IVE [facility];  Service: Cardiovascular;  Laterality: N/A;   CARDIAC CATHETERIZATION N/A 05/17/2015   Procedure: Coronary Balloon Angioplasty;  Surgeon:   Jayadeep S Varanasi, MD;  Location: [facility] CV [facility];  Service: Cardiovascular;  Laterality: N/A;   FEMUR IM NAIL Right 07/15/2012   Procedure: INTRAMEDULLARY (IM) NAIL HIP;  Surgeon: Joshua P Landau, MD;  Location: [facility] OR;  Service: Orthopedics;  Laterality: Right;   FEMUR IM NAIL Left 12/02/2015   Procedure: INTRAMEDULLARY (IM) NAIL FEMORAL;  Surgeon: Mark C Yates, MD;  Location: [facility] OR;  Service: Orthopedics;  Laterality: Left;   FRACTURE SURGERY     INCISION AND DRAINAGE HIP Left 01/03/2016   Procedure: IRRIGATION AND DEBRIDEMENT HIP;  Surgeon: Mark C Yates, MD;  Location: [facility] OR;  Service: Orthopedics;  Laterality: Left;   SHOULDER SURGERY Bilateral    "don't remember which side"     Home Medications:  Prior to Admission medications   Medication Sig Start Date End Date Taking? Authorizing Provider  Apoaequorin (PREVAGEN PO) Take 1 tablet by mouth daily.    [provider]  atorvastatin (LIPITOR) 80 MG tablet Take 1 tablet by mouth once daily 10/01/21   [name], Jose E, MD  ezetimibe (ZETIA) 10 MG tablet Take 1 tablet (10 mg total) by mouth daily. 12/04/21   Bellerose, Tiffany, MD  furosemide (LASIX) 40 MG tablet Take 1 tablet (40 mg total) by mouth daily as needed for fluid or edema. 07/23/22   Dahal, Binaya, MD  insulin NPH-regular Human (NOVOLIN 70/30 RELION) (70-30) 100 UNIT/ML injection 10 units in the morning and 5 units at night. Patient taking differently: 23 units in the morning and 5 units at night. 07/23/22   Dahal, Binaya, MD  metoprolol succinate (TOPROL-XL) 50 MG 24 hr tablet TAKE 1 TABLET BY MOUTH ONCE DAILY . APPOINTMENT REQUIRED FOR FUTURE REFILLS Patient taking differently: Take 50 mg by mouth daily. 06/15/22   , Tiffany, MD  Multiple Vitamins-Minerals (MULTIVITAMIN ADULT PO) Take 1 tablet by mouth daily with breakfast.     [provider]  VITAMIN D, CHOLECALCIFEROL, PO Take 1 tablet by mouth daily.    [provider]  warfarin (COUMADIN) 2.5 MG tablet Take 0.5 tablets (1.25 mg total) by mouth daily. 07/10/22   Ghimire, Kuber, MD    Inpatient Medications: Scheduled Meds:  amiodarone  150 mg Intravenous Once   atorvastatin  80 mg Oral Daily   ezetimibe  10 mg Oral Daily   furosemide  60 mg Intravenous Once   insulin aspart  0-15 Units Subcutaneous TID WC   insulin aspart  0-5 Units Subcutaneous QHS   insulin aspart protamine- aspart  10 Units Subcutaneous Q breakfast   insulin aspart protamine- aspart  5 Units Subcutaneous Q supper   metoprolol succinate  50 mg Oral Daily   Warfarin - Pharmacist Dosing Inpatient   Does not apply q1600   Continuous Infusions:  sodium chloride Stopped (08/03/22 1823)   amiodarone     Followed by   amiodarone     PRN Meds: sodium chloride  Allergies:    Allergies  Allergen Reactions   Procaine Hcl Anaphylaxis   Amoxicillin Itching    Social History:   Social History    Socioeconomic History   Marital status: Married    Spouse name: [name] [name]   Number of children: 1   Years of education: College   Highest education level: Not on file  Occupational History   Occupation: retired     Employer: RETIRED  Tobacco Use   Smoking status: Former    Types: Cigarettes    Quit date: 03/02/1978    Years   since quitting: 44.4   Smokeless tobacco: Never  Vaping Use   Vaping Use: Never used  Substance and Sexual Activity   Alcohol use: No    Alcohol/week: 0.0 standard drinks of alcohol   Drug use: No   Sexual activity: Not on file  Other Topics Concern   Not on file  Social History Narrative   Patient lives at home spouse. Uses a walker consistently.   Moved from [place] 2004   1 son, problems w/ drugs, passed away 01/2016-- OD       Social Determinants of Health   Financial Resource Strain: Low Risk  (08/22/2020)   Overall Financial Resource Strain (CARDIA)    Difficulty of Paying Living Expenses: Not hard at all  Food Insecurity: No Food Insecurity (07/24/2022)   Hunger Vital Sign    Worried About Running Out of Food in the Last Year: Never true    Ran Out of Food in the Last Year: Never true  Transportation Needs: No Transportation Needs (07/24/2022)   PRAPARE - Transportation    Lack of Transportation (Medical): No    Lack of Transportation (Non-Medical): No  Physical Activity: Inactive (08/22/2020)   Exercise Vital Sign    Days of Exercise per Week: 0 days    Minutes of Exercise per Session: 0 min  Stress: No Stress Concern Present (08/22/2020)   [employer] - Occupational Stress Questionnaire    Feeling of Stress : Not at all  Social Connections: Moderately Integrated (08/22/2020)   Social Connection and Isolation Panel [NHANES]    Frequency of Communication with Friends and Family: More than three times a week    Frequency of Social Gatherings with Friends and Family: More than three times a week    Attends Religious  Services: More than 4 times per year    Active Member of Clubs or Organizations: No    Attends Club or Organization Meetings: Never    Marital Status: Married  Intimate Partner Violence: Not At Risk (07/20/2022)   Humiliation, Afraid, Rape, and Kick questionnaire    Fear of Current or Ex-Partner: No    Emotionally Abused: No    Physically Abused: No    Sexually Abused: No    Family History:    Family History  Problem Relation Age of Onset   Diabetes Father    Heart attack Father 54   Diabetes Sister    Drug abuse Son    Cancer Neg Hx        no hx of colon or breast cancer     ROS:  Constitutional: Denied fever, chills, malaise, night sweats Eyes: Denied vision change or loss Ears/Nose/Mouth/Throat: Denied ear ache, sore throat, coughing, sinus pain Cardiovascular:see HPI  Respiratory: see HPI  Gastrointestinal: see HPI  Genital/Urinary: Denied dysuria, hematuria, urinary frequency/urgency Musculoskeletal: weakness, decondition  Skin: Denied rash, wound Neuro: Denied headache, dizziness, syncope Psych: Denied history of depression/anxiety  Endocrine: history of diabetes   Physical Exam/Data:   Vitals:   08/04/22 0930 08/04/22 1041 08/04/22 1100 08/04/22 1534  BP: 110/75  124/76 114/80  Pulse: (!) 113   (!) 114  Resp: 10  18 18  Temp:   97.9 F (36.6 C) 97.9 F (36.6 C)  TempSrc:   Oral Oral  SpO2: 96%  97% 100%  Weight:  72.9 kg    Height:  5' 9" (1.753 m)      Intake/Output Summary (Last 24 hours) at 08/04/2022 1600 Last data filed at 08/04/2022 0551   Gross per 24 hour  Intake 228.57 ml  Output 700 ml  Net -471.43 ml      08/04/2022   10:41 AM 08/03/2022    5:06 PM 07/20/2022    3:52 PM  Last 3 Weights  Weight (lbs) 160 lb 11.5 oz 161 lb 160 lb 11.5 oz  Weight (kg) 72.9 kg 73.029 kg 72.9 kg     Body mass index is 23.73 kg/m.  Vitals:  Vitals:   08/04/22 1100 08/04/22 1534  BP: 124/76 114/80  Pulse:  (!) 114  Resp: 18 18  Temp: 97.9 F (36.6 C) 97.9  F (36.6 C)  SpO2: 97% 100%   General Appearance: In no apparent distress, laying in bed HEENT: Normocephalic, atraumatic. Neck: Supple, trachea midline, no JVDs Cardiovascular: Irregular,  normal S1-S2,  no murmur Respiratory: Resting breathing unlabored, lungs sounds clear but diminished at base to auscultation bilaterally, no use of accessory muscles. On room air.  Speaks full sentence  Gastrointestinal: Bowel sounds positive, abdomen soft, non-tender Extremities: Able to move all extremities in bed without difficulty, no edema of BLE  Musculoskeletal: Normal muscle bulk and tone Skin: Intact, warm, dry. Some petechiae noted in arms  Neurologic: Alert, oriented to person, place and time. Memory deficit  Psychiatric: Mild irritation    EKG:  The EKG was personally reviewed and demonstrates:    EKG from 08/03/22 showed atrial flutter RVR 125bpm,  2:1  EKG from 08/04/22 showed atrial flutter RVR 105 bpm, variable AVB  Telemetry:  Telemetry was personally reviewed and demonstrates:    Atrial flutter with ventricular rate of 90-100s, variable AVB   Relevant CV Studies:   Zio 12/07/20:  HR 45 - 148bpm, average 56bpm. 1 NSVT lasting 4 beats. 26 SVT, longest lasting 13 beats. Rare supraventricular ectopy and ventricular ectopy. No sustained arrhythmias.   Stress Myoview 11/19/20:    Findings are consistent with prior myocardial infarction. The study is low risk.   No ST deviation was noted.   LV perfusion is abnormal. There is no evidence of ischemia. There is evidence of infarction. Defect 1: There is a medium defect with severe reduction in uptake present in the apical to mid inferolateral location(s) that is fixed. There is normal wall motion in the defect area. Consistent with infarction.   Left ventricular function is normal.   Prior study available for comparison from 03/05/2005. There are changes compared to prior study which appear to be new. The left ventricular ejection  fraction has normalized.   Anterior perfusion defect that is present at rest but not stress, consistent with breast attenuation artifact. Basal to mid inferolateral perfusion defect at rest and stress consistent with prior infarct in the LCX region.  There is no ischemia and wall motion is normal in this region.   Overall, low risk study.   Echo from 11/06/20:   1. Left ventricular ejection fraction, by estimation, is 45 to 50%. The  left ventricle has mildly decreased function. The left ventricle  demonstrates regional wall motion abnormalities (see scoring  diagram/findings for description). Left ventricular  diastolic parameters are consistent with Grade I diastolic dysfunction  (impaired relaxation). There is severe hypokinesis of the left  ventricular, mid-apical inferolateral wall.   2. Right ventricular systolic function is normal. The right ventricular  size is normal.   3. The mitral valve is rheumatic. Trivial mitral valve regurgitation. No  evidence of mitral stenosis.   4. The aortic valve is tricuspid. Aortic valve regurgitation is not  visualized.   No aortic stenosis is present.   5. The inferior vena cava is normal in size with greater than 50%  respiratory variability, suggesting right atrial pressure of 3 mmHg.   Comparison(s): Prior images unable to be directly viewed, comparison made  by report only.   Laboratory Data:  High Sensitivity Troponin:   Recent Labs  Lab 07/20/22 1011 07/20/22 1310 08/03/22 1655 08/03/22 1904  TROPONINIHS 14 13 9 9     Chemistry Recent Labs  Lab 08/03/22 1655 08/04/22 0001 08/04/22 0541  NA 127* 128*  --   K 3.7 3.5  --   CL 89* 92*  --   CO2 25 26  --   GLUCOSE 313* 298*  --   BUN 29* 29*  --   CREATININE 2.41* 2.41*  --   CALCIUM 7.9* 7.6*  --   MG  --   --  1.9  GFRNONAA 20* 20*  --   ANIONGAP 13 10  --     Recent Labs  Lab 08/03/22 1655  PROT 7.4  ALBUMIN 2.1*  AST 51*  ALT 21  ALKPHOS 101  BILITOT 0.3    Lipids No results for input(s): "CHOL", "TRIG", "HDL", "LABVLDL", "LDLCALC", "CHOLHDL" in the last 168 hours.  Hematology Recent Labs  Lab 08/03/22 1655  WBC 14.6*  RBC 3.24*  HGB 9.3*  HCT 28.9*  MCV 89.2  MCH 28.7  MCHC 32.2  RDW 13.2  PLT 662*   Thyroid No results for input(s): "TSH", "FREET4" in the last 168 hours.  BNP Recent Labs  Lab 08/03/22 1710  BNP 1,094.9*    DDimer No results for input(s): "DDIMER" in the last 168 hours.   Radiology/Studies:  DG Chest Portable 1 View  Result Date: 08/03/2022 CLINICAL DATA:  Epigastric pain with increased pallor and weakness EXAM: PORTABLE CHEST 1 VIEW COMPARISON:  Chest radiograph dated 07/20/2022 FINDINGS: Normal lung volumes. No focal consolidations. No pleural effusion or pneumothorax. Enlarged cardiomediastinal silhouette is likely projectional. Old posterior right sixth rib fracture. IMPRESSION: 1. No active cardiopulmonary disease. 2. Enlarged cardiomediastinal silhouette is likely projectional. Electronically Signed   By: Limin  Xu M.D.   On: 08/03/2022 17:33     Assessment and Plan:   Paroxysmal atrial flutter with RVR - hx of paroxysmal A fib dating back to 2017, now presented with A flutter RVR with variable AVB - diagnostic showed leukocytosis, AKI on CKD IV, acute hyponatremia, chronic anemia, elevated BNP, as well as supra-therapeutic INR 4.3 so far -  consider infectious workup, maybe the culprit of A fib RVR  - will check TSH - compliant with coumadin, pharmacy to dose, trend daily INR, goal 2-3, INR >4 no bleeding so far   - continued on PTA metoprolol XL 50mg, s/p amiodarone bolus, will start IV amiodarone gtt for chemical cardioversion, pending Echo, if not converting, will plan for DCCV   Acute on chronic systolic and diastolic heart failure  Ischemic cardiomyopathy  - LVEF down to 35-40% in 2017 NSTEMI event, improved to 45-50% 2022 - BNP 1094 - CXR no acute  - Lab with AKI on CKD IV, query  cardiorenal, recommend further workup with urine study, renal ultrasound  - clinically mildly hypervolemic , s/p IV Lasix 40mg x1 at ED, UOP 700ml, on IV Lasix 20mg BID per pirmary team, recommend to give IV lasix 60mg x1 dose now, trend daily BMP, continue track daily weight and strict I&O - she does not like the food here, discussed the need of low   Na diet   - Echo is pending  - GDMT: continued on PTA toprolol XL 50mg, historically did not want start new meds, not candidate for ARNI/MRA/SGLT2I given advanced renal disease, BP would not tolerate additional hydralazine and nitrate at this time   Chest pain CAD with hx of POBA to OM1 2017  - presented with atypical chest pain occurred since 2021  - Hs trop negative x2 - EKG with atrial flutter RVR - will check Echo - last stress myoview 2022 no sichemia  - pain is not consistent with angina, trop negative, she is also not a candidate for invasive cardiac workup involving contrast use, would favor medical management given her advanced age as well as comorbidity, diuresis to euvolemia, optimize medical therapy  - continue metoprolol XL 50mg daily, zetia 10mg, lipitor 80mg; given use of coumadin for A fib and frequent supratherapeutic INR /high risk of bleeding, would favor stop ASA 81mg daily   HTN - BP low normal on metoprolol and lasix   Leukocytosis  Supra-therapeutic INR Acute hyponatremia  AKI on CKD IV  Type 2 DM with hyperglycemia  HLD Debility Chronic anemia Anxiety  - per primary team    Risk Assessment/Risk Scores:    New York Heart Association (NYHA) Functional Class NYHA Class II  CHA2DS2-VASc Score = 7   This indicates a 11.2% annual risk of stroke. The patient's score is based upon: CHF History: 1 HTN History: 1 Diabetes History: 1 Stroke History: 0 Vascular Disease History: 1 Age Score: 2 Gender Score: 1     For questions or updates, please contact [facility] Please consult [email or web address] for  contact info under    Signed, Kaiyana Bedore, NP  08/04/2022 4:00 PM  

## 2022-08-04 NOTE — Progress Notes (Signed)
Patient admitted via [facility] from [facility]. Oriented to room with call bell and phone in reach. Bed alarm set.

## 2022-08-05 ENCOUNTER — Encounter (HOSPITAL_COMMUNITY)
Admission: EM | Disposition: A | Discharge status: Home / Self Care | Payer: Self-pay | Source: Home / Self Care | Attending: Internal Medicine

## 2022-08-05 ENCOUNTER — Inpatient Hospital Stay (HOSPITAL_COMMUNITY): Payer: Medicare Other

## 2022-08-05 ENCOUNTER — Inpatient Hospital Stay (HOSPITAL_COMMUNITY): Payer: Medicare Other | Admitting: Certified Registered"

## 2022-08-05 ENCOUNTER — Encounter (HOSPITAL_COMMUNITY): Payer: Self-pay | Admitting: Internal Medicine

## 2022-08-05 DIAGNOSIS — N179 Acute kidney failure, unspecified: Secondary | ICD-10-CM

## 2022-08-05 DIAGNOSIS — I4891 Unspecified atrial fibrillation: Secondary | ICD-10-CM | POA: Diagnosis not present

## 2022-08-05 DIAGNOSIS — I5021 Acute systolic (congestive) heart failure: Secondary | ICD-10-CM | POA: Diagnosis not present

## 2022-08-05 HISTORY — PX: CARDIOVERSION: SHX1299

## 2022-08-05 LAB — URINALYSIS, ROUTINE W REFLEX MICROSCOPIC
Bilirubin Urine: NEGATIVE
Glucose, UA: NEGATIVE mg/dL
Ketones, ur: NEGATIVE mg/dL
Nitrite: NEGATIVE
Protein, ur: 100 mg/dL — AB
Specific Gravity, Urine: 1.009 (ref 1.005–1.030)
WBC, UA: >50 WBC/hpf (ref 0–5)
pH: 7 (ref 5.0–8.0)

## 2022-08-05 LAB — BASIC METABOLIC PANEL
Anion gap: 10 (ref 5–15)
BUN: 27 mg/dL — ABNORMAL HIGH (ref 8–23)
CO2: 29 mmol/L (ref 22–32)
Calcium: 7.9 mg/dL — ABNORMAL LOW (ref 8.9–10.3)
Chloride: 94 mmol/L — ABNORMAL LOW (ref 98–111)
Creatinine, Ser: 2.63 mg/dL — ABNORMAL HIGH (ref 0.44–1.00)
GFR, Estimated: 18 mL/min — ABNORMAL LOW (ref >60)
Glucose, Bld: 262 mg/dL — ABNORMAL HIGH (ref 70–99)
Potassium: 4.5 mmol/L (ref 3.5–5.1)
Sodium: 133 mmol/L — ABNORMAL LOW (ref 135–145)

## 2022-08-05 LAB — GLUCOSE, CAPILLARY
Glucose-Capillary: 276 mg/dL — ABNORMAL HIGH (ref 70–99)
Glucose-Capillary: 180 mg/dL — ABNORMAL HIGH (ref 70–99)
Glucose-Capillary: 166 mg/dL — ABNORMAL HIGH (ref 70–99)
Glucose-Capillary: 231 mg/dL — ABNORMAL HIGH (ref 70–99)
Glucose-Capillary: 298 mg/dL — ABNORMAL HIGH (ref 70–99)
Glucose-Capillary: 234 mg/dL — ABNORMAL HIGH (ref 70–99)

## 2022-08-05 LAB — CBC
HCT: 27.2 % — ABNORMAL LOW (ref 36.0–46.0)
Hemoglobin: 8.7 g/dL — ABNORMAL LOW (ref 12.0–15.0)
MCH: 29 pg (ref 26.0–34.0)
MCHC: 32 g/dL (ref 30.0–36.0)
MCV: 90.7 fL (ref 80.0–100.0)
Platelets: 577 10*3/uL — ABNORMAL HIGH (ref 150–400)
RBC: 3 MIL/uL — ABNORMAL LOW (ref 3.87–5.11)
RDW: 13.2 % (ref 11.5–15.5)
WBC: 12.7 10*3/uL — ABNORMAL HIGH (ref 4.0–10.5)
nRBC: 0 % (ref 0.0–0.2)

## 2022-08-05 LAB — ECHOCARDIOGRAM COMPLETE
Height: 69 in
S' Lateral: 3.8 cm
Weight: 2751.34 oz

## 2022-08-05 LAB — PROTIME-INR
INR: 4.2 (ref 0.8–1.2)
Prothrombin Time: 40.7 seconds — ABNORMAL HIGH (ref 11.4–15.2)

## 2022-08-05 SURGERY — CARDIOVERSION
Anesthesia: General

## 2022-08-05 MED ORDER — PROPOFOL 10 MG/ML IV BOLUS
INTRAVENOUS | Status: DC | PRN
  Administered 2022-08-05: 50 mg via INTRAVENOUS

## 2022-08-05 MED ORDER — LIDOCAINE 2% (20 MG/ML) 5 ML SYRINGE
INTRAMUSCULAR | Status: DC | PRN
  Administered 2022-08-05: 100 mg via INTRAVENOUS

## 2022-08-05 MED ORDER — HYDRALAZINE HCL 25 MG PO TABS
25.0000 mg | ORAL_TABLET | Freq: Three times a day (TID) | ORAL | Status: DC
  Administered 2022-08-05 – 2022-08-08 (×6): 25 mg via ORAL
  Filled 2022-08-05 (×8): qty 1

## 2022-08-05 MED ORDER — EPHEDRINE SULFATE (PRESSORS) 50 MG/ML IJ SOLN
INTRAMUSCULAR | Status: DC | PRN
  Administered 2022-08-05: 5 mg via INTRAVENOUS

## 2022-08-05 MED ORDER — FOSFOMYCIN TROMETHAMINE 3 G PO PACK
3.0000 g | PACK | Freq: Once | ORAL | Status: AC
  Administered 2022-08-05: 3 g via ORAL
  Filled 2022-08-05: qty 3

## 2022-08-05 SURGICAL SUPPLY — 1 items: ELECT DEFIB PAD ADLT CADENCE (PAD) ×2 IMPLANT

## 2022-08-05 NOTE — Progress Notes (Signed)
PROGRESS NOTE    [name] [name]  FTD:[id number] DOB: 1940-09-17 DOA: 08/03/2022 PCP: [name], MD   Brief Narrative:    [name] [name] is a 82 y.o. female with medical history significant of PAF on Coumadin, ischemic cardiomyopathy with LVEF 45-50%, chronic HFrEF, CKD stage IV, IDDM with insulin resistance, seizure disorder, blindness of left eye recent ESBL UTI, presented with rapid A-fib and worsening of LVEF.  Patient has been admitted with chest pain secondary to paroxysmal atrial flutter/fibrillation and has been started on IV amiodarone per cardiology.  She is also noted to have acute on chronic HFrEF as a result of this.  Planning for DCCV today.  She also appears to have UTI with recent Klebsiella ESBL infection, plan to give fosfomycin today.  Assessment & Plan:   Principal Problem:   Atrial fibrillation with rapid ventricular response (HCC) Active Problems:   Acute systolic CHF (congestive heart failure) (HCC)   Cardiomyopathy (HCC)   CKD (chronic kidney disease) stage 4, GFR 15-29 ml/min (HCC)   A-fib (HCC)  Assessment and Plan:   Atrial flutter with RVR -With worsening of LVEF, cardiology started patient on amiodarone -INR= 4.3 today, hold off Coumadin, consult pharmacy to redose Coumadin tomorrow -1 dose of potassium 40 mg went given, check magnesium and phosphorus level -Continue metoprolol succinate -Planning for DCCV today and patient is currently n.p.o.   Acute on chronic HFrEF decompensation -Was found to be fluid overload initially on arrival at ED, received 60 mg IV Lasix.  Right now appears to be euvolemic, hold off diuresis, reevaluate tomorrow.  Mild AKI Related to diuresis, hold off on further diuresis at this time   UTI with leukocytosis -No clear etiology denies any cough shortness of breath fever or chills, no dysuria.  Noted patient was treated for UTI recently completed Bactrim on May 25. -Appears to have signs of UTI -Prior urine culture  with ESBL Klebsiella infection 5/20 -Plan to give 1 dose of fosfomycin today  Coumadin induced coagulopathy -No overt symptoms signs of bleeding, hold off Coumadin today   IDDM with insulin resistance -Labile glucose control, was hypoglycemic this afternoon.  Will hold off 70/30 -Continue sliding scale ordered by cardiology -Add Januvia   Hyponatremia-improving -Chronic, probably secondary to CHF, 1 dose of Lasix given, recheck sodium level tomorrow   CKD stage IV -Euvolemic-mild fluid overload, further diuresis as per cardiology   Chronic anemia secondary to -H&H stable, no symptoms or signs of active bleeding.    DVT prophylaxis:Warfarin Code Status: Full Family Communication: None at bedside; Cardiology has discussed with husband Disposition Plan: DCCV today Status is: Inpatient Remains inpatient appropriate because: Need for IV medications   Consultants:  Cardiology  Procedures:  Anticipated DCCV today  Antimicrobials:  None   Subjective: Patient seen and evaluated today with no new acute complaints or concerns. No acute concerns or events noted overnight.  She denies any chest pain or palpitations.  Currently n.p.o. for anticipated DCCV.  Objective: Vitals:   08/05/22 0400 08/05/22 0500 08/05/22 0623 08/05/22 0808  BP:   125/76 124/73  Pulse:   96   Resp: 15 18 15 16   Temp:   97.8 F (36.6 C) 97.8 F (36.6 C)  TempSrc:   Oral Oral  SpO2:   97% 97%  Weight:  78 kg    Height:        Intake/Output Summary (Last 24 hours) at 08/05/2022 0946 Last data filed at 08/04/2022 1900 Gross per 24 hour  Intake 240 ml  Output --  Net 240 ml   Filed Weights   08/04/22 1916 08/05/22 0041 08/05/22 0500  Weight: 72.9 kg 78 kg 78 kg    Examination:  General exam: Appears calm and comfortable  Respiratory system: Clear to auscultation. Respiratory effort normal. Cardiovascular system: S1 & S2 heard, irregular and tachycardic. Gastrointestinal system: Abdomen is  soft Central nervous system: Alert and awake Extremities: No edema Skin: No significant lesions noted Psychiatry: Flat affect.    Data Reviewed: I have personally reviewed following labs and imaging studies  CBC: Recent Labs  Lab 08/03/22 1655 08/05/22 0108  WBC 14.6* 12.7*  NEUTROABS 10.8*  --   HGB 9.3* 8.7*  HCT 28.9* 27.2*  MCV 89.2 90.7  PLT 662* 577*   Basic Metabolic Panel: Recent Labs  Lab 08/03/22 1655 08/04/22 0001 08/04/22 0541 08/04/22 1518 08/05/22 0108  NA 127* 128*  --   --  133*  K 3.7 3.5  --   --  4.5  CL 89* 92*  --   --  94*  CO2 25 26  --   --  29  GLUCOSE 313* 298*  --   --  262*  BUN 29* 29*  --   --  27*  CREATININE 2.41* 2.41*  --   --  2.63*  CALCIUM 7.9* 7.6*  --   --  7.9*  MG  --   --  1.9 2.1  --   PHOS  --   --   --  2.5  --    GFR: Estimated Creatinine Clearance: 17.8 mL/min (A) (by C-G formula based on SCr of 2.63 mg/dL (H)). Liver Function Tests: Recent Labs  Lab 08/03/22 1655  AST 51*  ALT 21  ALKPHOS 101  BILITOT 0.3  PROT 7.4  ALBUMIN 2.1*   Recent Labs  Lab 08/03/22 1655  LIPASE 21   No results for input(s): "AMMONIA" in the last 168 hours. Coagulation Profile: Recent Labs  Lab 08/03/22 1708 08/04/22 0541 08/05/22 0108  INR 3.9* 4.3* 4.2*   Cardiac Enzymes: No results for input(s): "CKTOTAL", "CKMB", "CKMBINDEX", "TROPONINI" in the last 168 hours. BNP (last 3 results) No results for input(s): "PROBNP" in the last 8760 hours. HbA1C: No results for input(s): "HGBA1C" in the last 72 hours. CBG: Recent Labs  Lab 08/04/22 1124 08/04/22 1603 08/04/22 1752 08/04/22 2100 08/05/22 0632  GLUCAP 227* 77 101* 210* 276*   Lipid Profile: No results for input(s): "CHOL", "HDL", "LDLCALC", "TRIG", "CHOLHDL", "LDLDIRECT" in the last 72 hours. Thyroid Function Tests: Recent Labs    08/04/22 1518  TSH 0.715   Anemia Panel: No results for input(s): "VITAMINB12", "FOLATE", "FERRITIN", "TIBC", "IRON",  "RETICCTPCT" in the last 72 hours. Sepsis Labs: No results for input(s): "PROCALCITON", "LATICACIDVEN" in the last 168 hours.  No results found for this or any previous visit (from the past 240 hour(s)).       Radiology Studies: DG Chest Portable 1 View  Result Date: 08/03/2022 CLINICAL DATA:  Epigastric pain with increased pallor and weakness EXAM: PORTABLE CHEST 1 VIEW COMPARISON:  Chest radiograph dated 07/20/2022 FINDINGS: Normal lung volumes. No focal consolidations. No pleural effusion or pneumothorax. Enlarged cardiomediastinal silhouette is likely projectional. Old posterior right sixth rib fracture. IMPRESSION: 1. No active cardiopulmonary disease. 2. Enlarged cardiomediastinal silhouette is likely projectional. Electronically Signed   By: Agustin Cree M.D.   On: 08/03/2022 17:33        Scheduled Meds:  atorvastatin  80 mg  Oral Daily   ezetimibe  10 mg Oral Daily   insulin aspart  0-15 Units Subcutaneous TID WC   insulin aspart  0-5 Units Subcutaneous QHS   linagliptin  5 mg Oral Daily   metoprolol succinate  50 mg Oral Daily   sodium chloride flush  3 mL Intravenous Q12H   Warfarin - Pharmacist Dosing Inpatient   Does not apply q1600   Continuous Infusions:  sodium chloride Stopped (08/03/22 1823)   sodium chloride     amiodarone 30 mg/hr (08/05/22 0910)     LOS: 1 day    Time spent: 35 minutes    Jackolyn Geron Hoover Brunette, DO [facility]  If 7PM-7AM, please contact night-coverage [email or web address] 08/05/2022, 9:46 AM

## 2022-08-05 NOTE — Progress Notes (Signed)
  Echocardiogram 2D Echocardiogram has been performed.  Delcie Roch 08/05/2022, 4:55 PM

## 2022-08-05 NOTE — Interval H&P Note (Signed)
History and Physical Interval Note:  08/05/2022 2:06 PM  [name] [name]  has presented today for surgery, with the diagnosis of afib.  The various methods of treatment have been discussed with the patient and family. After consideration of risks, benefits and other options for treatment, the patient has consented to  Procedure(s): CARDIOVERSION (N/A) as a surgical intervention.  The patient's history has been reviewed, patient examined, no change in status, stable for surgery.  I have reviewed the patient's chart and labs.  Questions were answered to the patient's satisfaction.     Chilton Si, MD

## 2022-08-05 NOTE — Transfer of Care (Signed)
Immediate Anesthesia Transfer of Care Note  Patient: [name] [name]  Procedure(s) Performed: CARDIOVERSION  Patient Location: PACU and Cath Lab  Anesthesia Type:General  Level of Consciousness: awake  Airway & Oxygen Therapy: Patient Spontanous Breathing and Patient connected to nasal cannula oxygen  Post-op Assessment: Report given to RN and Post -op Vital signs reviewed and stable  Post vital signs: Reviewed and stable  Last Vitals:  Vitals Value Taken Time  BP    Temp    Pulse    Resp    SpO2      Last Pain:  Vitals:   08/05/22 1349  TempSrc: Temporal  PainSc: 0-No pain         Complications: No notable events documented.

## 2022-08-05 NOTE — H&P (View-Only) (Signed)
Cardiology Progress Note  Patient ID: [name] [name] MRN: [id number] DOB: 01-14-1941 Date of Encounter: 08/05/2022  Primary Cardiologist: [name] Si, MD  Subjective   Chief Complaint: None.   HPI: Remains in atrial fibrillation/flutter.  Symptoms are improved with rate control.  Plan for cardioversion today.  NPO.  ROS:  All other ROS reviewed and negative. Pertinent positives noted in the HPI.     Inpatient Medications  Scheduled Meds:  atorvastatin  80 mg Oral Daily   ezetimibe  10 mg Oral Daily   insulin aspart  0-15 Units Subcutaneous TID WC   insulin aspart  0-5 Units Subcutaneous QHS   linagliptin  5 mg Oral Daily   metoprolol succinate  50 mg Oral Daily   sodium chloride flush  3 mL Intravenous Q12H   Warfarin - Pharmacist Dosing Inpatient   Does not apply q1600   Continuous Infusions:  sodium chloride Stopped (08/03/22 1823)   sodium chloride     amiodarone 30 mg/hr (08/04/22 2318)   PRN Meds: sodium chloride, sodium chloride, acetaminophen, ondansetron (ZOFRAN) IV, sodium chloride flush   Vital Signs   Vitals:   08/05/22 0400 08/05/22 0500 08/05/22 0623 08/05/22 0808  BP:   125/76 124/73  Pulse:   96   Resp: 15 18 15 16   Temp:   97.8 F (36.6 C) 97.8 F (36.6 C)  TempSrc:   Oral Oral  SpO2:   97% 97%  Weight:  78 kg    Height:        Intake/Output Summary (Last 24 hours) at 08/05/2022 0914 Last data filed at 08/04/2022 1900 Gross per 24 hour  Intake 240 ml  Output --  Net 240 ml      08/05/2022    5:00 AM 08/05/2022   12:41 AM 08/04/2022    7:16 PM  Last 3 Weights  Weight (lbs) 171 lb 15.3 oz 171 lb 15.3 oz 160 lb 11.5 oz  Weight (kg) 78 kg 78 kg 72.9 kg      Telemetry  Overnight telemetry shows a flutter heart rate 110-120 bpm, which I personally reviewed.    Physical Exam   Vitals:   08/05/22 0400 08/05/22 0500 08/05/22 0623 08/05/22 0808  BP:   125/76 124/73  Pulse:   96   Resp: 15 18 15 16   Temp:   97.8 F (36.6 C) 97.8 F  (36.6 C)  TempSrc:   Oral Oral  SpO2:   97% 97%  Weight:  78 kg    Height:        Intake/Output Summary (Last 24 hours) at 08/05/2022 0914 Last data filed at 08/04/2022 1900 Gross per 24 hour  Intake 240 ml  Output --  Net 240 ml       08/05/2022    5:00 AM 08/05/2022   12:41 AM 08/04/2022    7:16 PM  Last 3 Weights  Weight (lbs) 171 lb 15.3 oz 171 lb 15.3 oz 160 lb 11.5 oz  Weight (kg) 78 kg 78 kg 72.9 kg    Body mass index is 25.39 kg/m.  General: Well nourished, well developed, in no acute distress Head: Atraumatic, normal size  Eyes: PEERLA, EOMI  Neck: Supple, no JVD Endocrine: No thryomegaly Cardiac: Normal S1, S2; irregular rhythm Lungs: Clear to auscultation bilaterally, no wheezing, rhonchi or rales  Abd: Soft, nontender, no hepatomegaly  Ext: No edema, pulses 2+ Musculoskeletal: No deformities, BUE and BLE strength normal and equal Skin: Warm and dry, no rashes   Neuro:  Alert and oriented to person, place, time, and situation, CNII-XII grossly intact, no focal deficits  Psych: Normal mood and affect   Labs  High Sensitivity Troponin:   Recent Labs  Lab 07/20/22 1011 07/20/22 1310 08/03/22 1655 08/03/22 1904  TROPONINIHS 14 13 9 9      Cardiac EnzymesNo results for input(s): "TROPONINI" in the last 168 hours. No results for input(s): "TROPIPOC" in the last 168 hours.  Chemistry Recent Labs  Lab 08/03/22 1655 08/04/22 0001 08/05/22 0108  NA 127* 128* 133*  K 3.7 3.5 4.5  CL 89* 92* 94*  CO2 25 26 29   GLUCOSE 313* 298* 262*  BUN 29* 29* 27*  CREATININE 2.41* 2.41* 2.63*  CALCIUM 7.9* 7.6* 7.9*  PROT 7.4  --   --   ALBUMIN 2.1*  --   --   AST 51*  --   --   ALT 21  --   --   ALKPHOS 101  --   --   BILITOT 0.3  --   --   GFRNONAA 20* 20* 18*  ANIONGAP 13 10 10     Hematology Recent Labs  Lab 08/03/22 1655 08/05/22 0108  WBC 14.6* 12.7*  RBC 3.24* 3.00*  HGB 9.3* 8.7*  HCT 28.9* 27.2*  MCV 89.2 90.7  MCH 28.7 29.0  MCHC 32.2 32.0  RDW 13.2  13.2  PLT 662* 577*   BNP Recent Labs  Lab 08/03/22 1710  BNP 1,094.9*    DDimer No results for input(s): "DDIMER" in the last 168 hours.   Radiology  DG Chest Portable 1 View  Result Date: 08/03/2022 CLINICAL DATA:  Epigastric pain with increased pallor and weakness EXAM: PORTABLE CHEST 1 VIEW COMPARISON:  Chest radiograph dated 07/20/2022 FINDINGS: Normal lung volumes. No focal consolidations. No pleural effusion or pneumothorax. Enlarged cardiomediastinal silhouette is likely projectional. Old posterior right sixth rib fracture. IMPRESSION: 1. No active cardiopulmonary disease. 2. Enlarged cardiomediastinal silhouette is likely projectional. Electronically Signed   By: Agustin Cree M.D.   On: 08/03/2022 17:33    Cardiac Studies  Echo pending   Patient Profile  82 year old female with history of CKD stage IV, paroxysmal atrial fibrillation on Coumadin, CAD status post balloon angioplasty to OM1 in 2017, chronic systolic heart failure, UTI, diabetes, hypertension, HSV encephalitis (2014) who was admitted on 08/04/2022 with chest pain and atrial flutter.   Assessment & Plan   # Chest pain # Paroxysmal atrial fibrillation/flutter # Acute systolic heart failure, EF 45-50% -Admitted with symptoms of chest pain.  Found to be in rapid atrial flutter.  Symptoms of chest discomfort have improved dramatically with rate control. -Troponins negative x 2.  Known history of CAD however symptoms have improved with rate control.  Suspect this was all related to her A-fib.  Acute coronary syndrome is not suspected. -She was placed on amiodarone with hopes to convert her back to rhythm.  This did not work.  We will plan for electrical cardioversion today.  Likely would benefit from amiodarone given how symptomatic she was with A-fib.  We will continue this after DCCV. -On Coumadin.  INR has been therapeutic over the past 3 to 4 weeks.  No need for transesophageal echo.  Continue Coumadin. -Continue  metoprolol succinate as well. -Volume status seems acceptable today.  Repeat echo is pending.  No further diuresis given rise in serum creatinine. -Risks and benefits explained to the patient.  I also discussed the procedure with her husband [name] [name] by phone.  Shared Decision Making/Informed  Consent The risks (stroke, cardiac arrhythmias rarely resulting in the need for a temporary or permanent pacemaker, skin irritation or burns and complications associated with conscious sedation including aspiration, arrhythmia, respiratory failure and death), benefits (restoration of normal sinus rhythm) and alternatives of a direct current cardioversion were explained in detail to [name] [name] and she agrees to proceed.   # AKI -Hold further diuresis.  Suspect this was related to diuresis.  # Hyponatremia -Suspect this is hypervolemia.  Has improved with diuresis.  Holding further diuresis due to AKI.  # CAD status post POBA -Underwent balloon angioplasty to an OM branch in 2017.  Troponins are negative.  Chest pain symptoms have improved with rate control. -Not on aspirin as she is on Coumadin.  On statin agents.  On Zetia.  On beta-blocker.      For questions or updates, please contact [facility] Please consult [email or web address] for contact info under        Signed, Gerri Spore T. Flora Lipps, MD, [facility] [place]  [facility]  08/05/2022 9:14 AM

## 2022-08-05 NOTE — Consult Note (Addendum)
   [facility] CM Inpatient Consult   08/05/2022  AMAURIE CHAMBERLAND Jul 05, 1940 [id number]  [facility] [[facility]]  Accountable Care Organization [ACO] Patient: Medicare ACO REACH  Primary Care Provider:  [name], MD with [place] [facility] which is listed to provide the transition of care follow up   Patient is a newly active referral with [facility] [[facility]] Care Management for chronic disease management services.  Patient has been engaged by a [facility] RNCC.  Our community based plan of care has focused on disease management and community resource support.  Discussed in unit progression meeting and patient is for a cardiac procedure this morning. Patient is off the unit.  Plan: Follow with inpatient Transition Of Care [TOC] team member to make aware that [facility] Care Management following and for any new post hospital community care needs. Also, noted patient's Hemoglobin A1C is 9.0.  Will alert the [facility] RNCC to monitor.  Of note, [facility] Care Management services does not replace or interfere with any services that are needed or arranged by inpatient [facility] care management team.   For additional questions or referrals please contact:  Charlesetta Shanks, RN BSN CCM [facility]  [phone] business mobile phone Toll free office [phone]  *Concierge Line  [phone] Fax number: [phone] [email or web address]

## 2022-08-05 NOTE — CV Procedure (Signed)
Electrical Cardioversion Procedure Note [name] [name] [id number] Jan 15, 1941  Procedure: Electrical Cardioversion Indications:  Atrial Fibrillation  Procedure Details Consent: Risks of procedure as well as the alternatives and risks of each were explained to the (patient/caregiver).  Consent for procedure obtained. Time Out: Verified patient identification, verified procedure, site/side was marked, verified correct patient position, special equipment/implants available, medications/allergies/relevent history reviewed, required imaging and test results available.  Performed  Patient placed on cardiac monitor, pulse oximetry, supplemental oxygen as necessary.  Sedation given:  propofol Pacer pads placed anterior and posterior chest.  Cardioverted 1 time(s).  Cardioverted at 150J.  Evaluation Findings: Post procedure EKG shows: NSR Complications: None Patient did tolerate procedure well.   Chilton Si, MD 08/05/2022, 2:46 PM

## 2022-08-05 NOTE — Inpatient Diabetes Management (Signed)
Inpatient Diabetes Program Recommendations  AACE/ADA: New Consensus Statement on Inpatient Glycemic Control (2015)  Target Ranges:  Prepandial:   less than 140 mg/dL      Peak postprandial:   less than 180 mg/dL (1-2 hours)      Critically ill patients:  140 - 180 mg/dL   Lab Results  Component Value Date   GLUCAP 276 (H) 08/05/2022   HGBA1C 9.0 (H) 05/05/2022    Review of Glycemic Control  Latest Reference Range & Units 08/04/22 07:16 08/04/22 11:24 08/04/22 16:03 08/04/22 17:52 08/04/22 21:00 08/05/22 06:32  Glucose-Capillary 70 - 99 mg/dL 161 (H)  09/60 [id number] 227 (H) 77 101 (H) 210 (H) 276 (H)  (H): Data is abnormally high  Diabetes history: DM2 Outpatient Diabetes medications: 70/30 23 units QAM and 5 QPM Current orders for Inpatient glycemic control: Novolog 0-15 units TID and Tradjenta 5 mg QD  Inpatient Diabetes Program Recommendations:    Received 70/30 10 units yesterday and went down to 77 mg/dL. Unsure if patient ate.  70/30 was discontinued.  Please consider:  Semglee 15 units QD   Will continue to follow while inpatient.  Thank you, Dulce Sellar, MSN, CDCES Diabetes Coordinator Inpatient Diabetes Program [phone] (team pager from 8a-5p)

## 2022-08-05 NOTE — Progress Notes (Signed)
Cardiology Progress Note  Patient ID: [name] [name] MRN: [id number] DOB: 03/13/1940 Date of Encounter: 08/05/2022  Primary Cardiologist: Tiffany Lucas, MD  Subjective   Chief Complaint: None.   HPI: Remains in atrial fibrillation/flutter.  Symptoms are improved with rate control.  Plan for cardioversion today.  NPO.  ROS:  All other ROS reviewed and negative. Pertinent positives noted in the HPI.     Inpatient Medications  Scheduled Meds:  atorvastatin  80 mg Oral Daily   ezetimibe  10 mg Oral Daily   insulin aspart  0-15 Units Subcutaneous TID WC   insulin aspart  0-5 Units Subcutaneous QHS   linagliptin  5 mg Oral Daily   metoprolol succinate  50 mg Oral Daily   sodium chloride flush  3 mL Intravenous Q12H   Warfarin - Pharmacist Dosing Inpatient   Does not apply q1600   Continuous Infusions:  sodium chloride Stopped (08/03/22 1823)   sodium chloride     amiodarone 30 mg/hr (08/04/22 2318)   PRN Meds: sodium chloride, sodium chloride, acetaminophen, ondansetron (ZOFRAN) IV, sodium chloride flush   Vital Signs   Vitals:   08/05/22 0400 08/05/22 0500 08/05/22 0623 08/05/22 0808  BP:   125/76 124/73  Pulse:   96   Resp: 15 18 15 16  Temp:   97.8 F (36.6 C) 97.8 F (36.6 C)  TempSrc:   Oral Oral  SpO2:   97% 97%  Weight:  78 kg    Height:        Intake/Output Summary (Last 24 hours) at 08/05/2022 0914 Last data filed at 08/04/2022 1900 Gross per 24 hour  Intake 240 ml  Output --  Net 240 ml      08/05/2022    5:00 AM 08/05/2022   12:41 AM 08/04/2022    7:16 PM  Last 3 Weights  Weight (lbs) 171 lb 15.3 oz 171 lb 15.3 oz 160 lb 11.5 oz  Weight (kg) 78 kg 78 kg 72.9 kg      Telemetry  Overnight telemetry shows a flutter heart rate 110-120 bpm, which I personally reviewed.    Physical Exam   Vitals:   08/05/22 0400 08/05/22 0500 08/05/22 0623 08/05/22 0808  BP:   125/76 124/73  Pulse:   96   Resp: 15 18 15 16  Temp:   97.8 F (36.6 C) 97.8 F  (36.6 C)  TempSrc:   Oral Oral  SpO2:   97% 97%  Weight:  78 kg    Height:        Intake/Output Summary (Last 24 hours) at 08/05/2022 0914 Last data filed at 08/04/2022 1900 Gross per 24 hour  Intake 240 ml  Output --  Net 240 ml       08/05/2022    5:00 AM 08/05/2022   12:41 AM 08/04/2022    7:16 PM  Last 3 Weights  Weight (lbs) 171 lb 15.3 oz 171 lb 15.3 oz 160 lb 11.5 oz  Weight (kg) 78 kg 78 kg 72.9 kg    Body mass index is 25.39 kg/m.  General: Well nourished, well developed, in no acute distress Head: Atraumatic, normal size  Eyes: PEERLA, EOMI  Neck: Supple, no JVD Endocrine: No thryomegaly Cardiac: Normal S1, S2; irregular rhythm Lungs: Clear to auscultation bilaterally, no wheezing, rhonchi or rales  Abd: Soft, nontender, no hepatomegaly  Ext: No edema, pulses 2+ Musculoskeletal: No deformities, BUE and BLE strength normal and equal Skin: Warm and dry, no rashes   Neuro:   Alert and oriented to person, place, time, and situation, CNII-XII grossly intact, no focal deficits  Psych: Normal mood and affect   Labs  High Sensitivity Troponin:   Recent Labs  Lab 07/20/22 1011 07/20/22 1310 08/03/22 1655 08/03/22 1904  TROPONINIHS 14 13 9 9     Cardiac EnzymesNo results for input(s): "TROPONINI" in the last 168 hours. No results for input(s): "TROPIPOC" in the last 168 hours.  Chemistry Recent Labs  Lab 08/03/22 1655 08/04/22 0001 08/05/22 0108  NA 127* 128* 133*  K 3.7 3.5 4.5  CL 89* 92* 94*  CO2 25 26 29  GLUCOSE 313* 298* 262*  BUN 29* 29* 27*  CREATININE 2.41* 2.41* 2.63*  CALCIUM 7.9* 7.6* 7.9*  PROT 7.4  --   --   ALBUMIN 2.1*  --   --   AST 51*  --   --   ALT 21  --   --   ALKPHOS 101  --   --   BILITOT 0.3  --   --   GFRNONAA 20* 20* 18*  ANIONGAP 13 10 10    Hematology Recent Labs  Lab 08/03/22 1655 08/05/22 0108  WBC 14.6* 12.7*  RBC 3.24* 3.00*  HGB 9.3* 8.7*  HCT 28.9* 27.2*  MCV 89.2 90.7  MCH 28.7 29.0  MCHC 32.2 32.0  RDW 13.2  13.2  PLT 662* 577*   BNP Recent Labs  Lab 08/03/22 1710  BNP 1,094.9*    DDimer No results for input(s): "DDIMER" in the last 168 hours.   Radiology  DG Chest Portable 1 View  Result Date: 08/03/2022 CLINICAL DATA:  Epigastric pain with increased pallor and weakness EXAM: PORTABLE CHEST 1 VIEW COMPARISON:  Chest radiograph dated 07/20/2022 FINDINGS: Normal lung volumes. No focal consolidations. No pleural effusion or pneumothorax. Enlarged cardiomediastinal silhouette is likely projectional. Old posterior right sixth rib fracture. IMPRESSION: 1. No active cardiopulmonary disease. 2. Enlarged cardiomediastinal silhouette is likely projectional. Electronically Signed   By: Limin  Xu M.D.   On: 08/03/2022 17:33    Cardiac Studies  Echo pending   Patient Profile  82-year-old female with history of CKD stage IV, paroxysmal atrial fibrillation on Coumadin, CAD status post balloon angioplasty to OM1 in 2017, chronic systolic heart failure, UTI, diabetes, hypertension, HSV encephalitis (2014) who was admitted on 08/04/2022 with chest pain and atrial flutter.   Assessment & Plan   # Chest pain # Paroxysmal atrial fibrillation/flutter # Acute systolic heart failure, EF 45-50% -Admitted with symptoms of chest pain.  Found to be in rapid atrial flutter.  Symptoms of chest discomfort have improved dramatically with rate control. -Troponins negative x 2.  Known history of CAD however symptoms have improved with rate control.  Suspect this was all related to her A-fib.  Acute coronary syndrome is not suspected. -She was placed on amiodarone with hopes to convert her back to rhythm.  This did not work.  We will plan for electrical cardioversion today.  Likely would benefit from amiodarone given how symptomatic she was with A-fib.  We will continue this after DCCV. -On Coumadin.  INR has been therapeutic over the past 3 to 4 weeks.  No need for transesophageal echo.  Continue Coumadin. -Continue  metoprolol succinate as well. -Volume status seems acceptable today.  Repeat echo is pending.  No further diuresis given rise in serum creatinine. -Risks and benefits explained to the patient.  I also discussed the procedure with her husband [name] by phone.  Shared Decision Making/Informed   Consent The risks (stroke, cardiac arrhythmias rarely resulting in the need for a temporary or permanent pacemaker, skin irritation or burns and complications associated with conscious sedation including aspiration, arrhythmia, respiratory failure and death), benefits (restoration of normal sinus rhythm) and alternatives of a direct current cardioversion were explained in detail to Ms. Frappier and she agrees to proceed.   # AKI -Hold further diuresis.  Suspect this was related to diuresis.  # Hyponatremia -Suspect this is hypervolemia.  Has improved with diuresis.  Holding further diuresis due to AKI.  # CAD status post POBA -Underwent balloon angioplasty to an OM branch in 2017.  Troponins are negative.  Chest pain symptoms have improved with rate control. -Not on aspirin as she is on Coumadin.  On statin agents.  On Zetia.  On beta-blocker.      For questions or updates, please contact [facility] Please consult [email or web address] for contact info under        Signed, Mineralwells T. O'Neal, MD, FACC [place]  [facility]  08/05/2022 9:14 AM   

## 2022-08-05 NOTE — Interval H&P Note (Signed)
History and Physical Interval Note:  08/05/2022 8:49 AM  [name] [name]  has presented today for surgery, with the diagnosis of afib.  The various methods of treatment have been discussed with the patient and family. After consideration of risks, benefits and other options for treatment, the patient has consented to  Procedure(s): CARDIOVERSION (N/A) as a surgical intervention.  The patient's history has been reviewed, patient examined, no change in status, stable for surgery.  I have reviewed the patient's chart and labs.  Questions were answered to the patient's satisfaction.     Chilton Si, MD

## 2022-08-05 NOTE — Progress Notes (Signed)
ANTICOAGULATION CONSULT NOTE  Pharmacy Consult for warfarin  Indication: atrial fibrillation  Allergies  Allergen Reactions   Procaine Hcl Anaphylaxis   Amoxicillin Itching    Patient Measurements: Height: 5\' 9"  (175.3 cm) Weight: 78 kg (171 lb 15.3 oz) IBW/kg (Calculated) : 66.2  Vital Signs: Temp: 97.8 F (36.6 C) (06/05 0808) Temp Source: Oral (06/05 0808) BP: 124/73 (06/05 0808) Pulse Rate: 96 (06/05 0623)  Labs: Recent Labs    08/03/22 1655 08/03/22 1708 08/03/22 1904 08/04/22 0001 08/04/22 0541 08/05/22 0108  HGB 9.3*  --   --   --   --  8.7*  HCT 28.9*  --   --   --   --  27.2*  PLT 662*  --   --   --   --  577*  LABPROT  --  38.6*  --   --  41.6* 40.7*  INR  --  3.9*  --   --  4.3* 4.2*  CREATININE 2.41*  --   --  2.41*  --  2.63*  TROPONINIHS 9  --  9  --   --   --      Estimated Creatinine Clearance: 17.8 mL/min (A) (by C-G formula based on SCr of 2.63 mg/dL (H)).  Assessment: Patient admitted with CC of chest pain, recent admission for AKI. Was found to be in Afib with RVR in ED w fluid overload. She is on chronic warfarin for afib. INR on aremains elevated at 4.3. No bleeding noted.   INR 4.2, remains supra-therapeutic.  Warfarin dose was held on 08/04/22.  Hgb 9.3>down to 8.7, plts 662>577, No bleeding reported.  Noted recently completed Bactrim 5/10- 07/23/22 on recent hospitalization, which can increase warfarin effect.   He was not discharged on any antibiotic.  Not on antibiotic currently this admission. Amiodarone IV infusion started 08/04/22, may increase warfarin hypoprothrombinemia effect/   PTA medication history is pending.  Goal of Therapy:  INR 2-3 Monitor platelets by anticoagulation protocol: Yes   Plan:  No warfarin today Daily INR  Thank you for allowing pharmacy to be part of this patients care team. Noah Delaine, RPh Clinical Pharmacist 08/05/2022 8:49 AM  Please check AMION for all [facility] Pharmacy phone numbers After 10:00 PM, call  Main Pharmacy [phone]

## 2022-08-06 ENCOUNTER — Encounter (HOSPITAL_COMMUNITY): Payer: Self-pay | Admitting: Cardiovascular Disease

## 2022-08-06 DIAGNOSIS — I5021 Acute systolic (congestive) heart failure: Secondary | ICD-10-CM | POA: Diagnosis not present

## 2022-08-06 DIAGNOSIS — I4891 Unspecified atrial fibrillation: Secondary | ICD-10-CM | POA: Diagnosis not present

## 2022-08-06 LAB — BASIC METABOLIC PANEL
Anion gap: 10 (ref 5–15)
BUN: 30 mg/dL — ABNORMAL HIGH (ref 8–23)
CO2: 26 mmol/L (ref 22–32)
Calcium: 7.7 mg/dL — ABNORMAL LOW (ref 8.9–10.3)
Chloride: 93 mmol/L — ABNORMAL LOW (ref 98–111)
Creatinine, Ser: 2.6 mg/dL — ABNORMAL HIGH (ref 0.44–1.00)
GFR, Estimated: 18 mL/min — ABNORMAL LOW (ref >60)
Glucose, Bld: 260 mg/dL — ABNORMAL HIGH (ref 70–99)
Potassium: 4.1 mmol/L (ref 3.5–5.1)
Sodium: 129 mmol/L — ABNORMAL LOW (ref 135–145)

## 2022-08-06 LAB — GLUCOSE, CAPILLARY
Glucose-Capillary: 226 mg/dL — ABNORMAL HIGH (ref 70–99)
Glucose-Capillary: 259 mg/dL — ABNORMAL HIGH (ref 70–99)
Glucose-Capillary: 129 mg/dL — ABNORMAL HIGH (ref 70–99)

## 2022-08-06 LAB — CBC
HCT: 26.4 % — ABNORMAL LOW (ref 36.0–46.0)
Hemoglobin: 8.6 g/dL — ABNORMAL LOW (ref 12.0–15.0)
MCH: 28.7 pg (ref 26.0–34.0)
MCHC: 32.6 g/dL (ref 30.0–36.0)
MCV: 88 fL (ref 80.0–100.0)
Platelets: 564 10*3/uL — ABNORMAL HIGH (ref 150–400)
RBC: 3 MIL/uL — ABNORMAL LOW (ref 3.87–5.11)
RDW: 13.6 % (ref 11.5–15.5)
WBC: 10.3 10*3/uL (ref 4.0–10.5)
nRBC: 0 % (ref 0.0–0.2)

## 2022-08-06 LAB — PROTIME-INR
INR: 4 — ABNORMAL HIGH (ref 0.8–1.2)
Prothrombin Time: 39.2 seconds — ABNORMAL HIGH (ref 11.4–15.2)

## 2022-08-06 LAB — TROPONIN I (HIGH SENSITIVITY)
Troponin I (High Sensitivity): 13 ng/L (ref <18)
Troponin I (High Sensitivity): 16 ng/L (ref <18)

## 2022-08-06 MED ORDER — ALUM & MAG HYDROXIDE-SIMETH 200-200-20 MG/5ML PO SUSP
15.0000 mL | Freq: Once | ORAL | Status: AC
  Administered 2022-08-06: 15 mL via ORAL
  Filled 2022-08-06: qty 30

## 2022-08-06 MED ORDER — WARFARIN SODIUM 1 MG PO TABS
1.0000 mg | ORAL_TABLET | Freq: Once | ORAL | Status: DC
  Filled 2022-08-06 (×2): qty 1

## 2022-08-06 MED ORDER — INSULIN GLARGINE-YFGN 100 UNIT/ML ~~LOC~~ SOLN
10.0000 [IU] | Freq: Every day | SUBCUTANEOUS | Status: DC
  Administered 2022-08-06 – 2022-08-12 (×7): 10 [IU] via SUBCUTANEOUS
  Filled 2022-08-06 (×7): qty 0.1

## 2022-08-06 MED ORDER — WARFARIN SODIUM 1 MG PO TABS: 1.0000 mg | ORAL_TABLET | Freq: Once | ORAL | Status: DC

## 2022-08-06 MED ORDER — NITROGLYCERIN 0.4 MG SL SUBL: 0.4000 mg | SUBLINGUAL_TABLET | SUBLINGUAL | Status: DC | PRN

## 2022-08-06 MED ORDER — AMIODARONE HCL 200 MG PO TABS: 200.0000 mg | ORAL_TABLET | Freq: Every day | ORAL | Status: DC

## 2022-08-06 MED ORDER — PANTOPRAZOLE SODIUM 40 MG PO TBEC
40.0000 mg | DELAYED_RELEASE_TABLET | Freq: Two times a day (BID) | ORAL | Status: DC
  Administered 2022-08-06: 40 mg via ORAL
  Filled 2022-08-06: qty 1

## 2022-08-06 MED ORDER — AMIODARONE HCL 200 MG PO TABS
400.0000 mg | ORAL_TABLET | Freq: Two times a day (BID) | ORAL | Status: DC
  Administered 2022-08-06 – 2022-08-12 (×12): 400 mg via ORAL
  Filled 2022-08-06 (×13): qty 2

## 2022-08-06 MED ORDER — ISOSORBIDE MONONITRATE ER 30 MG PO TB24
30.0000 mg | ORAL_TABLET | Freq: Every day | ORAL | Status: DC
  Administered 2022-08-06 – 2022-08-08 (×3): 30 mg via ORAL
  Filled 2022-08-06 (×3): qty 1

## 2022-08-06 NOTE — Progress Notes (Signed)
Heart Failure Nurse Navigator Progress Note  PCP: [name], MD PCP-Cardiologist: Duke Salvia Admission Diagnosis: None Admitted from: Home  Presentation:   [name] [name] presented in rapid A-fib, after a recent appointment with cardiology. HR in the 160's. Shortness of breath, and nausea. Was given IV lasix and started on a Amiodarone drip. BNP 1,094. CXR without active cardiopulmonary disease. Had a successful DCCV on 6/5,   Patient educated on the sign and symptoms of heart failure, daily weights, when to call her doctor or go to the ED. Diet/ fluid restrictions, taking all medications as prescribed and attending all medical appointments. Patient verbalized her understanding, a HF TOC appointment was scheduled for 08/25/2022 @ 12 noon.   ECHO/ LVEF: 35-40% HFrEF  Clinical Course:  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   HYPERLIPIDEMIA 03/27/2006   HYPERTENSION 03/27/2006   Intertrochanteric fracture of right hip (HCC) 07/13/2012   LUNG NODULE 09/01/2006   Excision, Bx Benighn   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"   Osteopenia 2004   Dexa 2004 showed Osteopenia, DEXA 03/2007 normal   Osteopenia    Other chronic cystitis with hematuria    Pelvic fracture (HCC) 06/16/2016   S/P fall   Recurrent urinary tract infection    Seeing Urology   RETINOPATHY, BACKGROUND NOS 03/27/2006   Seizures (HCC)    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     Social History   Socioeconomic History   Marital status: Married    Spouse name:  [name]   Number of children: 1   Years of education: [employer] education level: Not on file  Occupational History   Occupation: retired     Associate Professor: RETIRED  Tobacco Use   Smoking status: Former    Types: Cigarettes    Quit date: 03/02/1978    Years since quitting: 44.4   Smokeless tobacco: Never  Vaping Use   Vaping Use: Never used  Substance and Sexual Activity   Alcohol use: No    Alcohol/week: 0.0 standard drinks of alcohol   Drug use: No   Sexual activity: Not on file  Other Topics Concern   Not on file  Social History Narrative   Patient lives at home spouse. Uses a walker consistently.   Moved from [place] 2004   1 son, problems w/ drugs, passed away 03/02/16-- OD       Social Determinants of Health   Financial Resource Strain: Low Risk  (08/22/2020)   Overall Financial Resource Strain (CARDIA)    Difficulty of Paying Living Expenses: Not hard at all  Food Insecurity: No Food Insecurity (08/04/2022)   Hunger Vital Sign    Worried About Running Out of Food in the Last Year: Never true    Ran Out of Food in the Last Year: Never true  Transportation Needs: No Transportation Needs (08/04/2022)   PRAPARE - Administrator, Civil Service (Medical): No    Lack of Transportation (Non-Medical):  No  Physical Activity: Inactive (08/22/2020)   Exercise Vital Sign    Days of Exercise per Week: 0 days    Minutes of Exercise per Session: 0 min  Stress: No Stress Concern Present (08/22/2020)   [employer] of Occupational Health - Occupational Stress Questionnaire    Feeling of Stress : Not at all  Social Connections: Moderately Integrated (08/22/2020)   Social Connection and Isolation Panel [NHANES]    Frequency of Communication with Friends and Family: More than three times a week    Frequency of Social Gatherings with Friends and Family: More than three times a week    Attends Religious Services: More than 4 times per year    Active Member of [employer] or  Organizations: No    Attends Engineer, structural: Never    Marital Status: Married   Water engineer and Provision:  Detailed education and instructions provided on heart failure disease management including the following:  Signs and symptoms of Heart Failure When to call the physician Importance of daily weights Low sodium diet Fluid restriction Medication management Anticipated future follow-up appointments  Patient education given on each of the above topics.  Patient acknowledges understanding via teach back method and acceptance of all instructions.  Education Materials:  "Living Better With Heart Failure" Booklet, HF zone tool, & Daily Weight Tracker Tool.  Patient has scale at home: Yes Patient has pill box at home: Yes    High Risk Criteria for Readmission and/or Poor Patient Outcomes: Heart failure hospital admissions (last 6 months): 1  No Show rate: 1 % Difficult social situation: No Demonstrates medication adherence: Yes Primary Language: English Literacy level: Reading, writing, and comprehension  Barriers of Care:   Diet/ fluid restrictions Daily weights  Considerations/Referrals:   Referral made to Heart Failure Pharmacist Stewardship: Yes Referral made to Heart Failure CSW/NCM TOC: No Referral made to Heart & Vascular TOC clinic: Yes,   Items for Follow-up on DC/TOC: Diet/ fluid restrictions Daily weights Continued HF education   Rhae Hammock, BSN, RN Heart Failure Print production planner Chat Only

## 2022-08-06 NOTE — Progress Notes (Signed)
ANTICOAGULATION CONSULT NOTE  Pharmacy Consult for warfarin  Indication: atrial fibrillation  Allergies  Allergen Reactions   Procaine Hcl Anaphylaxis   Amoxicillin Itching    Patient Measurements: Height: 5\' 9"  (175.3 cm) Weight: 78 kg (171 lb 15.3 oz) IBW/kg (Calculated) : 66.2  Vital Signs: Temp: 97.8 F (36.6 C) (06/06 1116) Temp Source: Oral (06/06 1116) BP: 125/66 (06/06 1116) Pulse Rate: 64 (06/06 1116)  Labs: Recent Labs    08/03/22 1655 08/03/22 1708 08/03/22 1904 08/04/22 0001 08/04/22 0541 08/05/22 0108 08/06/22 1103  HGB 9.3*  --   --   --   --  8.7* 8.6*  HCT 28.9*  --   --   --   --  27.2* 26.4*  PLT 662*  --   --   --   --  577* 564*  LABPROT  --    < >  --   --  41.6* 40.7* 39.2*  INR  --    < >  --   --  4.3* 4.2* 4.0*  CREATININE 2.41*  --   --  2.41*  --  2.63* 2.60*  TROPONINIHS 9  --  9  --   --   --   --    < > = values in this interval not displayed.     Estimated Creatinine Clearance: 18 mL/min (A) (by C-G formula based on SCr of 2.6 mg/dL (H)).  Assessment: Patient admitted with CC of chest pain, recent admission for AKI. Was found to be in Afib with RVR in ED w fluid overload. She is on chronic warfarin for afib. INR on admission was elevated at 3.9.   Warfarin  pharmacy consult was ordered on 08/03/22 and we have held warfarin doses due to INR remained supratherapeutic. INR trend over last 3 days:  4.3>4.2>4.0 today.   Hgb 8.6 low/stable,  plts  564 stable , No bleeding reported.  Currently on amiodarone  08/04/22,  which likely to  increase warfarin hypoprothrombinemic effect.  PTA  Warfarin dose was 1.25 mg daily, last taken 08/03/22 @ 0800 prior to admission.   I anticipate she will need lower warfarin dose than PTA now that she is taking amiodarone. May only need warfarin dose every other day.  Warfarin dose has been held for 3 days.  INR today is above goal however is trending down.  Will give low dose of 1mg  today.   Goal of Therapy:   INR 2-3 Monitor platelets by anticoagulation protocol: Yes   Plan:  Give Warfarin 1 mg today x1.  Daily INR  Thank you for allowing pharmacy to be part of this patients care team. Noah Delaine, RPh Clinical Pharmacist 08/06/2022 3:08 PM  Please check AMION for all [facility] Pharmacy phone numbers After 10:00 PM, call Main Pharmacy [phone]

## 2022-08-06 NOTE — Inpatient Diabetes Management (Signed)
Inpatient Diabetes Program Recommendations  AACE/ADA: New Consensus Statement on Inpatient Glycemic Control (2015)  Target Ranges:  Prepandial:   less than 140 mg/dL      Peak postprandial:   less than 180 mg/dL (1-2 hours)      Critically ill patients:  140 - 180 mg/dL   Lab Results  Component Value Date   GLUCAP 226 (H) 08/06/2022   HGBA1C 9.0 (H) 05/05/2022    Review of Glycemic Control  Latest Reference Range & Units 08/05/22 17:16 08/05/22 19:31 08/05/22 21:45 08/06/22 06:23  Glucose-Capillary 70 - 99 mg/dL 161 (H) 096 (H) 045 (H) 226 (H)  (H): Data is abnormally high Diabetes history: DM2 Outpatient Diabetes medications: 70/30 23 units QAM and 5 QPM Current orders for Inpatient glycemic control: Novolog 0-15 units TID and Tradjenta 5 mg QD   Inpatient Diabetes Program Recommendations:     Consider:   Semglee 15 units QD    Will continue to follow while inpatient.  Thanks, Lujean Rave, MSN, RNC-OB Diabetes Coordinator [phone] (8a-5p)

## 2022-08-06 NOTE — Progress Notes (Addendum)
PROGRESS NOTE    DAVINITY ROMINE  ZOX:[id number] DOB: [birth year] DOA: 08/03/2022 PCP: [name], MD   Brief Narrative:  [name] is a 82 y.o. female with medical history significant of PAF on Coumadin, ischemic cardiomyopathy with LVEF 45-50%, chronic HFrEF, CKD stage IV, IDDM with insulin resistance, seizure disorder, blindness of left eye recent ESBL UTI, presented with rapid A-fib and worsening of LVEF.  Patient has been admitted with chest pain secondary to paroxysmal atrial flutter/fibrillation and has been started on IV amiodarone per cardiology.  She is also noted to have acute on chronic HFrEF as a result of this.  Underwent DCCV  6/05.  She also appears to have UTI with recent Klebsiella ESBL infection, received fosfomycin 6/05.  Assessment & Plan:   Principal Problem:   Atrial fibrillation with rapid ventricular response (HCC) Active Problems:   Acute systolic CHF (congestive heart failure) (HCC)   Cardiomyopathy (HCC)   CKD (chronic kidney disease) stage 4, GFR 15-29 ml/min (HCC)   A-fib (HCC)  Assessment and Plan: 1-Atrial flutter with RVR -With worsening of LVEF, cardiology started patient on amiodarone -INR= 4.3  pharmacy following. Holding coumadin.  -Continue metoprolol succinate -Underwent DCCV 6/05. Had episode of A fib overnight back in sinus.  -She has been transition to oral Amiodarone.   Acute on chronic HFrEF decompensation -Was found to be fluid overload initially on arrival at ED, received 60 mg IV Lasix.  Right now appears to be euvolemic.  -On Lasix PRN.   Mild AKI on CKD stage IV Prior Cr 2.4 Related to diuresis, hold off on further diuresis at this time Renal function stable today.    UTI with leukocytosis - Noted patient was treated for UTI recently completed Bactrim on May 25. -Appears to have signs of UTI -Prior urine culture with ESBL Klebsiella infection 5/20 -Received  fosfomycin 6/05  Coumadin induced coagulopathy -No overt  symptoms signs of bleeding, hold off Coumadin    IDDM with insulin resistance -Labile glucose control, hold off 70/30 -Continue sliding scale ordered by cardiology -Added  Januvia Start low dose Semglee.    Hyponatremia-improving -Chronic, probably secondary to CHF, 1 dose of Lasix given,  Chronic anemia secondary to -H&H stable, no symptoms or signs of active bleeding.   Chest pain;  Report chest pain this afternoon. Report pain on and off for 3 years. They dont found anything.  She was tender on palpation.  Start PPI, Maalox.  Troponin, EKG.   DVT prophylaxis:Warfarin Code Status: Full Family Communication: None at bedside; Cardiology has discussed with husband Disposition Plan: DCCV today Status is: Inpatient Remains inpatient appropriate because: Need for IV medications   Consultants:  Cardiology  Procedures:  Anticipated DCCV today  Antimicrobials:  None   Subjective: She doesn't have any new complaints. Feel well.   Objective: Vitals:   08/06/22 0023 08/06/22 0443 08/06/22 0911 08/06/22 1116  BP: (!) 108/53 (!) 118/53 (!) 113/41 125/66  Pulse: 66 60 66 64  Resp: 18 14 18 18   Temp: 98.7 F (37.1 C) 98.5 F (36.9 C) 98.6 F (37 C) 97.8 F (36.6 C)  TempSrc: Oral Oral Oral Oral  SpO2: 97% 99% 99% 95%  Weight:  78 kg    Height:        Intake/Output Summary (Last 24 hours) at 08/06/2022 1506 Last data filed at 08/06/2022 1118 Gross per 24 hour  Intake 663.37 ml  Output 400 ml  Net 263.37 ml    [employer]  08/05/22 0041 08/05/22 0500 08/06/22 0443  Weight: 78 kg 78 kg 78 kg    Examination:  General exam: NAD Respiratory system: CTA Cardiovascular system: S 1, S 2 RRR Gastrointestinal system: BS present, soft, nt Central nervous system: alert Extremities: No edema   Data Reviewed: I have personally reviewed following labs and imaging studies  CBC: Recent Labs  Lab 08/03/22 1655 08/05/22 0108 08/06/22 1103  WBC 14.6* 12.7* 10.3   NEUTROABS 10.8*  --   --   HGB 9.3* 8.7* 8.6*  HCT 28.9* 27.2* 26.4*  MCV 89.2 90.7 88.0  PLT 662* 577* 564*    Basic Metabolic Panel: Recent Labs  Lab 08/03/22 1655 08/04/22 0001 08/04/22 0541 08/04/22 1518 08/05/22 0108 08/06/22 1103  NA 127* 128*  --   --  133* 129*  K 3.7 3.5  --   --  4.5 4.1  CL 89* 92*  --   --  94* 93*  CO2 25 26  --   --  29 26  GLUCOSE 313* 298*  --   --  262* 260*  BUN 29* 29*  --   --  27* 30*  CREATININE 2.41* 2.41*  --   --  2.63* 2.60*  CALCIUM 7.9* 7.6*  --   --  7.9* 7.7*  MG  --   --  1.9 2.1  --   --   PHOS  --   --   --  2.5  --   --     GFR: Estimated Creatinine Clearance: 18 mL/min (A) (by C-G formula based on SCr of 2.6 mg/dL (H)). Liver Function Tests: Recent Labs  Lab 08/03/22 1655  AST 51*  ALT 21  ALKPHOS 101  BILITOT 0.3  PROT 7.4  ALBUMIN 2.1*    Recent Labs  Lab 08/03/22 1655  LIPASE 21    No results for input(s): "AMMONIA" in the last 168 hours. Coagulation Profile: Recent Labs  Lab 08/03/22 1708 08/04/22 0541 08/05/22 0108 08/06/22 1103  INR 3.9* 4.3* 4.2* 4.0*    Cardiac Enzymes: No results for input(s): "CKTOTAL", "CKMB", "CKMBINDEX", "TROPONINI" in the last 168 hours. BNP (last 3 results) No results for input(s): "PROBNP" in the last 8760 hours. HbA1C: No results for input(s): "HGBA1C" in the last 72 hours. CBG: Recent Labs  Lab 08/05/22 1716 08/05/22 1931 08/05/22 2145 08/06/22 0623 08/06/22 1111  GLUCAP 231* 298* 234* 226* 259*    Lipid Profile: No results for input(s): "CHOL", "HDL", "LDLCALC", "TRIG", "CHOLHDL", "LDLDIRECT" in the last 72 hours. Thyroid Function Tests: Recent Labs    08/04/22 1518  TSH 0.715    Anemia Panel: No results for input(s): "VITAMINB12", "FOLATE", "FERRITIN", "TIBC", "IRON", "RETICCTPCT" in the last 72 hours. Sepsis Labs: No results for input(s): "PROCALCITON", "LATICACIDVEN" in the last 168 hours.  No results found for this or any previous  visit (from the past 240 hour(s)).       Radiology Studies: ECHOCARDIOGRAM COMPLETE  Result Date: 08/05/2022    ECHOCARDIOGRAM REPORT   Patient Name:   LENNICE DUBOSE Date of Exam: 08/05/2022 Medical Rec #:  [id number]         Height:       69.0 in Accession #:    [id number]        Weight:       172.0 lb Date of Birth:  1940/07/02        BSA:          1.938 m Patient Age:  81 years          BP:           117/57 mmHg Patient Gender: F                 HR:           107 bpm. Exam Location:  Inpatient Procedure: 2D Echo, Color Doppler and Cardiac Doppler Indications:    acute systolic chf  History:        Patient has prior history of Echocardiogram examinations, most                 recent 11/06/2020. Cardiomyopathy, CAD, chronic kidney disease,                 Arrythmias:Atrial Fibrillation; Risk Factors:Hypertension,                 Dyslipidemia and Diabetes.  Sonographer:    Delcie Roch RDCS Referring Phys: [phone] DEBBY CROSLEY  Sonographer Comments: Image acquisition challenging due to patient body habitus. IMPRESSIONS  1. Left ventricular ejection fraction, by estimation, is 35 to 40%. The left ventricle has moderately decreased function. The left ventricle demonstrates global hypokinesis. Left ventricular diastolic function could not be evaluated.  2. Right ventricular systolic function is normal. The right ventricular size is normal. There is normal pulmonary artery systolic pressure. The estimated right ventricular systolic pressure is 29.4 mmHg.  3. Left atrial size was moderately dilated.  4. There is a mobile echo bright denisty on the anterior mitral valve leaflet that likely represents calcification. If clinical concern for endocarditis, recommend TEE>. The mitral valve is degenerative. Mild mitral valve regurgitation. No evidence of mitral stenosis. Moderate mitral annular calcification.  5. The aortic valve is normal in structure. Aortic valve regurgitation is not visualized. No aortic  stenosis is present.  6. The inferior vena cava is normal in size with greater than 50% respiratory variability, suggesting right atrial pressure of 3 mmHg. FINDINGS  Left Ventricle: Left ventricular ejection fraction, by estimation, is 35 to 40%. The left ventricle has moderately decreased function. The left ventricle demonstrates global hypokinesis. The left ventricular internal cavity size was normal in size. There is no left ventricular hypertrophy. Left ventricular diastolic function could not be evaluated. Right Ventricle: The right ventricular size is normal. No increase in right ventricular wall thickness. Right ventricular systolic function is normal. There is normal pulmonary artery systolic pressure. The tricuspid regurgitant velocity is 2.57 m/s, and  with an assumed right atrial pressure of 3 mmHg, the estimated right ventricular systolic pressure is 29.4 mmHg. Left Atrium: Left atrial size was moderately dilated. Right Atrium: Right atrial size was normal in size. Pericardium: There is no evidence of pericardial effusion. Mitral Valve: There is a mobile echo bright denisty on the anterior mitral valve leaflet that likely represents calcification. If clinical concern for endocarditis, recommend TEE>. The mitral valve is degenerative in appearance. There is mild calcification of the mitral valve leaflet(s). Moderate mitral annular calcification. Mild mitral valve regurgitation. No evidence of mitral valve stenosis. Tricuspid Valve: The tricuspid valve is normal in structure. Tricuspid valve regurgitation is trivial. No evidence of tricuspid stenosis. Aortic Valve: The aortic valve is normal in structure. Aortic valve regurgitation is not visualized. No aortic stenosis is present. Pulmonic Valve: The pulmonic valve was normal in structure. Pulmonic valve regurgitation is not visualized. No evidence of pulmonic stenosis. Aorta: The aortic root is normal in size and structure. Venous: The inferior vena cava  is normal in size with greater than 50% respiratory variability, suggesting right atrial pressure of 3 mmHg. IAS/Shunts: No atrial level shunt detected by color flow Doppler.  LEFT VENTRICLE PLAX 2D LVIDd:         4.60 cm LVIDs:         3.80 cm LV PW:         1.10 cm LV IVS:        1.00 cm LVOT diam:     1.90 cm LV SV:         37 LV SV Index:   19 LVOT Area:     2.84 cm  RIGHT VENTRICLE          IVC RV Basal diam:  2.80 cm  IVC diam: 1.80 cm LEFT ATRIUM              Index        RIGHT ATRIUM           Index LA diam:        4.20 cm  2.17 cm/m   RA Area:     13.50 cm LA Vol (A2C):   107.0 ml 55.22 ml/m  RA Volume:   29.60 ml  15.28 ml/m LA Vol (A4C):   72.2 ml  37.26 ml/m LA Biplane Vol: 88.0 ml  45.42 ml/m  AORTIC VALVE LVOT Vmax:   67.70 cm/s LVOT Vmean:  44.000 cm/s LVOT VTI:    0.131 m  AORTA Ao Root diam: 3.30 cm Ao Asc diam:  3.20 cm TRICUSPID VALVE TR Peak grad:   26.4 mmHg TR Vmax:        257.00 cm/s  SHUNTS Systemic VTI:  0.13 m Systemic Diam: 1.90 cm Armanda Magic MD Electronically signed by Armanda Magic MD Signature Date/Time: 08/05/2022/8:51:01 PM    Final    EP STUDY  Result Date: 08/05/2022 See surgical note for result.       Scheduled Meds:  amiodarone  400 mg Oral BID   Followed by   Melene Muller ON 08/13/2022] amiodarone  200 mg Oral Daily   atorvastatin  80 mg Oral Daily   ezetimibe  10 mg Oral Daily   hydrALAZINE  25 mg Oral Q8H   insulin aspart  0-15 Units Subcutaneous TID WC   insulin aspart  0-5 Units Subcutaneous QHS   insulin glargine-yfgn  10 Units Subcutaneous Daily   isosorbide mononitrate  30 mg Oral Daily   linagliptin  5 mg Oral Daily   metoprolol succinate  50 mg Oral Daily   sodium chloride flush  3 mL Intravenous Q12H   Warfarin - Pharmacist Dosing Inpatient   Does not apply q1600   Continuous Infusions:  sodium chloride 0 mL/hr at 08/03/22 1823   sodium chloride       LOS: 2 days    Time spent: 35 minutes    Emerson Barretto A Jeanelle Dake, DO [facility]  If 7PM-7AM, please contact night-coverage [email or web address] 08/06/2022, 3:06 PM

## 2022-08-06 NOTE — Anesthesia Preprocedure Evaluation (Signed)
Anesthesia Evaluation  Patient identified by MRN, date of birth, ID band Patient awake    Reviewed: Allergy & Precautions, NPO status , Patient's Chart, lab work & pertinent test results  Airway Mallampati: II  TM Distance: >3 FB Neck ROM: Full    Dental  (+) Dental Advisory Given   Pulmonary neg pulmonary ROS, former smoker   Pulmonary exam normal breath sounds clear to auscultation       Cardiovascular hypertension, + CAD and +CHF  Normal cardiovascular exam Rhythm:Regular Rate:Normal  Echo 08/05/2022  1. Left ventricular ejection fraction, by estimation, is 35 to 40%. The  left ventricle has moderately decreased function. The left ventricle  demonstrates global hypokinesis. Left ventricular diastolic function could  not be evaluated.   2. Right ventricular systolic function is normal. The right ventricular  size is normal. There is normal pulmonary artery systolic pressure. The  estimated right ventricular systolic pressure is 29.4 mmHg.   3. Left atrial size was moderately dilated.   4. There is a mobile echo bright denisty on the anterior mitral valve  leaflet that likely represents calcification. If clinical concern for  endocarditis, recommend TEE>. The mitral valve is degenerative. Mild  mitral valve regurgitation. No evidence of  mitral stenosis. Moderate mitral annular calcification.   5. The aortic valve is normal in structure. Aortic valve regurgitation is  not visualized. No aortic stenosis is present.   6. The inferior vena cava is normal in size with greater than 50%  respiratory variability, suggesting right atrial pressure of 3 mmHg.      Neuro/Psych Seizures -,  PSYCHIATRIC DISORDERS Anxiety Depression       GI/Hepatic Neg liver ROS,GERD  ,,  Endo/Other  negative endocrine ROSdiabetes    Renal/GU Renal disease     Musculoskeletal  (+) Arthritis ,    Abdominal   Peds  Hematology  (+) Blood  dyscrasia, anemia   Anesthesia Other Findings   Reproductive/Obstetrics                             Anesthesia Physical Anesthesia Plan  ASA: 3  Anesthesia Plan: General   Post-op Pain Management: Minimal or no pain anticipated   Induction: Intravenous  PONV Risk Score and Plan: Propofol infusion, TIVA and Treatment may vary due to age or medical condition  Airway Management Planned: Natural Airway  Additional Equipment:   Intra-op Plan:   Post-operative Plan:   Informed Consent: I have reviewed the patients History and Physical, chart, labs and discussed the procedure including the risks, benefits and alternatives for the proposed anesthesia with the patient or authorized representative who has indicated his/her understanding and acceptance.     Dental advisory given  Plan Discussed with: CRNA  Anesthesia Plan Comments:        Anesthesia Quick Evaluation

## 2022-08-06 NOTE — Progress Notes (Signed)
Cardiology Progress Note  Patient ID: [name] [name] MRN: [id number] DOB: [birth year] Date of Encounter: 08/06/2022  Primary Cardiologist: [name] Si, MD  Subjective   Chief Complaint: None.   HPI: A-fib overnight.  Back in sinus rhythm.  Denies symptoms.  EF is reduced.  ROS:  All other ROS reviewed and negative. Pertinent positives noted in the HPI.     Inpatient Medications  Scheduled Meds:  amiodarone  400 mg Oral BID   Followed by   Melene Muller ON 08/13/2022] amiodarone  200 mg Oral Daily   atorvastatin  80 mg Oral Daily   ezetimibe  10 mg Oral Daily   hydrALAZINE  25 mg Oral Q8H   insulin aspart  0-15 Units Subcutaneous TID WC   insulin aspart  0-5 Units Subcutaneous QHS   isosorbide mononitrate  30 mg Oral Daily   linagliptin  5 mg Oral Daily   metoprolol succinate  50 mg Oral Daily   sodium chloride flush  3 mL Intravenous Q12H   Warfarin - Pharmacist Dosing Inpatient   Does not apply q1600   Continuous Infusions:  sodium chloride 0 mL/hr at 08/03/22 1823   sodium chloride     PRN Meds: sodium chloride, sodium chloride, acetaminophen, ondansetron (ZOFRAN) IV, sodium chloride flush   Vital Signs   Vitals:   08/05/22 1519 08/05/22 1928 08/06/22 0023 08/06/22 0443  BP: 127/79 122/66 (!) 108/53 (!) 118/53  Pulse: (!) 104 (!) 108 66 60  Resp: 16 20 18 14   Temp:  97.7 F (36.5 C) 98.7 F (37.1 C) 98.5 F (36.9 C)  TempSrc:  Oral Oral Oral  SpO2: 100% 97% 97% 99%  Weight:    78 kg  Height:        Intake/Output Summary (Last 24 hours) at 08/06/2022 0821 Last data filed at 08/05/2022 2100 Gross per 24 hour  Intake 623.37 ml  Output 200 ml  Net 423.37 ml      08/06/2022    4:43 AM 08/05/2022    5:00 AM 08/05/2022   12:41 AM  Last 3 Weights  Weight (lbs) 171 lb 15.3 oz 171 lb 15.3 oz 171 lb 15.3 oz  Weight (kg) 78 kg 78 kg 78 kg      Telemetry  Overnight telemetry shows sinus rhythm 60 to 70 bpm, which I personally reviewed.   ECG  The most recent ECG  shows sinus rhythm heart rate 66, first-degree AV block, poor R wave progression, which I personally reviewed.   Physical Exam   Vitals:   08/05/22 1519 08/05/22 1928 08/06/22 0023 08/06/22 0443  BP: 127/79 122/66 (!) 108/53 (!) 118/53  Pulse: (!) 104 (!) 108 66 60  Resp: 16 20 18 14   Temp:  97.7 F (36.5 C) 98.7 F (37.1 C) 98.5 F (36.9 C)  TempSrc:  Oral Oral Oral  SpO2: 100% 97% 97% 99%  Weight:    78 kg  Height:        Intake/Output Summary (Last 24 hours) at 08/06/2022 0821 Last data filed at 08/05/2022 2100 Gross per 24 hour  Intake 623.37 ml  Output 200 ml  Net 423.37 ml       08/06/2022    4:43 AM 08/05/2022    5:00 AM 08/05/2022   12:41 AM  Last 3 Weights  Weight (lbs) 171 lb 15.3 oz 171 lb 15.3 oz 171 lb 15.3 oz  Weight (kg) 78 kg 78 kg 78 kg    Body mass index is 25.39 kg/m.  General:  Well nourished, well developed, in no acute distress Head: Atraumatic, normal size  Eyes: PEERLA, EOMI  Neck: Supple, no JVD Endocrine: No thryomegaly Cardiac: Normal S1, S2; RRR; no murmurs, rubs, or gallops Lungs: Clear to auscultation bilaterally, no wheezing, rhonchi or rales  Abd: Soft, nontender, no hepatomegaly  Ext: No edema, pulses 2+ Musculoskeletal: No deformities, BUE and BLE strength normal and equal Skin: Warm and dry, no rashes   Neuro: Alert and oriented to person, place, time, and situation, CNII-XII grossly intact, no focal deficits  Psych: Normal mood and affect   Labs  High Sensitivity Troponin:   Recent Labs  Lab 07/20/22 1011 07/20/22 1310 08/03/22 1655 08/03/22 1904  TROPONINIHS 14 13 9 9      Cardiac EnzymesNo results for input(s): "TROPONINI" in the last 168 hours. No results for input(s): "TROPIPOC" in the last 168 hours.  Chemistry Recent Labs  Lab 08/03/22 1655 08/04/22 0001 08/05/22 0108  NA 127* 128* 133*  K 3.7 3.5 4.5  CL 89* 92* 94*  CO2 25 26 29   GLUCOSE 313* 298* 262*  BUN 29* 29* 27*  CREATININE 2.41* 2.41* 2.63*  CALCIUM 7.9*  7.6* 7.9*  PROT 7.4  --   --   ALBUMIN 2.1*  --   --   AST 51*  --   --   ALT 21  --   --   ALKPHOS 101  --   --   BILITOT 0.3  --   --   GFRNONAA 20* 20* 18*  ANIONGAP 13 10 10     Hematology Recent Labs  Lab 08/03/22 1655 08/05/22 0108  WBC 14.6* 12.7*  RBC 3.24* 3.00*  HGB 9.3* 8.7*  HCT 28.9* 27.2*  MCV 89.2 90.7  MCH 28.7 29.0  MCHC 32.2 32.0  RDW 13.2 13.2  PLT 662* 577*   BNP Recent Labs  Lab 08/03/22 1710  BNP 1,094.9*    DDimer No results for input(s): "DDIMER" in the last 168 hours.   Radiology  ECHOCARDIOGRAM COMPLETE  Result Date: 08/05/2022    ECHOCARDIOGRAM REPORT   Patient Name:   [name] [name] Date of Exam: 08/05/2022 Medical Rec #:  [id number]         Height:       69.0 in Accession #:    [id number]        Weight:       172.0 lb Date of Birth:  1940/12/03        BSA:          1.938 m Patient Age:    81 years          BP:           117/57 mmHg Patient Gender: F                 HR:           107 bpm. Exam Location:  Inpatient Procedure: 2D Echo, Color Doppler and Cardiac Doppler Indications:    acute systolic chf  History:        Patient has prior history of Echocardiogram examinations, most                 recent 11/06/2020. Cardiomyopathy, CAD, chronic kidney disease,                 Arrythmias:Atrial Fibrillation; Risk Factors:Hypertension,                 Dyslipidemia and Diabetes.  Sonographer:    [name] Shames  [name] RDCS Referring Phys: [id number] [name] [name]  Sonographer Comments: Image acquisition challenging due to patient body habitus. IMPRESSIONS  1. Left ventricular ejection fraction, by estimation, is 35 to 40%. The left ventricle has moderately decreased function. The left ventricle demonstrates global hypokinesis. Left ventricular diastolic function could not be evaluated.  2. Right ventricular systolic function is normal. The right ventricular size is normal. There is normal pulmonary artery systolic pressure. The estimated right ventricular systolic  pressure is 29.4 mmHg.  3. Left atrial size was moderately dilated.  4. There is a mobile echo bright denisty on the anterior mitral valve leaflet that likely represents calcification. If clinical concern for endocarditis, recommend TEE>. The mitral valve is degenerative. Mild mitral valve regurgitation. No evidence of mitral stenosis. Moderate mitral annular calcification.  5. The aortic valve is normal in structure. Aortic valve regurgitation is not visualized. No aortic stenosis is present.  6. The inferior vena cava is normal in size with greater than 50% respiratory variability, suggesting right atrial pressure of 3 mmHg. FINDINGS  Left Ventricle: Left ventricular ejection fraction, by estimation, is 35 to 40%. The left ventricle has moderately decreased function. The left ventricle demonstrates global hypokinesis. The left ventricular internal cavity size was normal in size. There is no left ventricular hypertrophy. Left ventricular diastolic function could not be evaluated. Right Ventricle: The right ventricular size is normal. No increase in right ventricular wall thickness. Right ventricular systolic function is normal. There is normal pulmonary artery systolic pressure. The tricuspid regurgitant velocity is 2.57 m/s, and  with an assumed right atrial pressure of 3 mmHg, the estimated right ventricular systolic pressure is 29.4 mmHg. Left Atrium: Left atrial size was moderately dilated. Right Atrium: Right atrial size was normal in size. Pericardium: There is no evidence of pericardial effusion. Mitral Valve: There is a mobile echo bright denisty on the anterior mitral valve leaflet that likely represents calcification. If clinical concern for endocarditis, recommend TEE>. The mitral valve is degenerative in appearance. There is mild calcification of the mitral valve leaflet(s). Moderate mitral annular calcification. Mild mitral valve regurgitation. No evidence of mitral valve stenosis. Tricuspid Valve: The  tricuspid valve is normal in structure. Tricuspid valve regurgitation is trivial. No evidence of tricuspid stenosis. Aortic Valve: The aortic valve is normal in structure. Aortic valve regurgitation is not visualized. No aortic stenosis is present. Pulmonic Valve: The pulmonic valve was normal in structure. Pulmonic valve regurgitation is not visualized. No evidence of pulmonic stenosis. Aorta: The aortic root is normal in size and structure. Venous: The inferior vena cava is normal in size with greater than 50% respiratory variability, suggesting right atrial pressure of 3 mmHg. IAS/Shunts: No atrial level shunt detected by color flow Doppler.  LEFT VENTRICLE PLAX 2D LVIDd:         4.60 cm LVIDs:         3.80 cm LV PW:         1.10 cm LV IVS:        1.00 cm LVOT diam:     1.90 cm LV SV:         37 LV SV Index:   19 LVOT Area:     2.84 cm  RIGHT VENTRICLE          IVC RV Basal diam:  2.80 cm  IVC diam: 1.80 cm LEFT ATRIUM              Index        RIGHT ATRIUM  Index LA diam:        4.20 cm  2.17 cm/m   RA Area:     13.50 cm LA Vol (A2C):   107.0 ml 55.22 ml/m  RA Volume:   29.60 ml  15.28 ml/m LA Vol (A4C):   72.2 ml  37.26 ml/m LA Biplane Vol: 88.0 ml  45.42 ml/m  AORTIC VALVE LVOT Vmax:   67.70 cm/s LVOT Vmean:  44.000 cm/s LVOT VTI:    0.131 m  AORTA Ao Root diam: 3.30 cm Ao Asc diam:  3.20 cm TRICUSPID VALVE TR Peak grad:   26.4 mmHg TR Vmax:        257.00 cm/s  SHUNTS Systemic VTI:  0.13 m Systemic Diam: 1.90 cm Armanda Magic MD Electronically signed by Armanda Magic MD Signature Date/Time: 08/05/2022/8:51:01 PM    Final    EP STUDY  Result Date: 08/05/2022 See surgical note for result.   Cardiac Studies  TTE 08/05/2022  1. Left ventricular ejection fraction, by estimation, is 35 to 40%. The  left ventricle has moderately decreased function. The left ventricle  demonstrates global hypokinesis. Left ventricular diastolic function could  not be evaluated.   2. Right ventricular systolic  function is normal. The right ventricular  size is normal. There is normal pulmonary artery systolic pressure. The  estimated right ventricular systolic pressure is 29.4 mmHg.   3. Left atrial size was moderately dilated.   4. There is a mobile echo bright denisty on the anterior mitral valve  leaflet that likely represents calcification. If clinical concern for  endocarditis, recommend TEE>. The mitral valve is degenerative. Mild  mitral valve regurgitation. No evidence of  mitral stenosis. Moderate mitral annular calcification.   5. The aortic valve is normal in structure. Aortic valve regurgitation is  not visualized. No aortic stenosis is present.   6. The inferior vena cava is normal in size with greater than 50%  respiratory variability, suggesting right atrial pressure of 3 mmHg.   Patient Profile  82 year old female with history of CKD stage IV, paroxysmal atrial fibrillation on Coumadin, CAD status post balloon angioplasty to OM1 in 2017, chronic systolic heart failure, UTI, diabetes, hypertension, HSV encephalitis (2014) who was admitted on 08/04/2022 with chest pain and atrial flutter.   Assessment & Plan   # Paroxysmal atrial fibrillation/flutter # Chest pain -Admitted with chest pain symptoms that coincide with atrial flutter and RVR.  Troponins are negative.  History of CAD but this does not represent an acute coronary syndrome.  I believe her symptoms are all related to atrial fibrillation. -Status post cardioversion on 08/05/2022.  He did have return of sinus rhythm but brief A-fib overnight.  Has been maintaining sinus rhythm on amiodarone drip. -EF now reduced.  Favor rhythm control strategy.  Suspect her drop in EF is arrhythmia related. -Stop amiodarone drip.  Transition to 400 mg of p.o. amiodarone twice daily for 7 days and then 200 mg daily.  Given reduction in ejection fraction she should likely be considered for A-fib ablation.  Other option would be rhythm management  with amiodarone.  Limited options given CKD. -Continue warfarin.  INR goal 2-3.  # Acute systolic heart failure, EF 35-40% -Global hypokinesis.  Ejection fraction reduction is likely related to arrhythmia.  Would recommend rhythm control strategy as above. -Continue metoprolol succinate 50 mg daily.  I have added hydralazine 25 mg 3 times daily and Imdur 30 mg daily.  She is not a candidate for ACE/ARB/ARNI/MRA given CKD stage  IV. -Volume status is normal on exam.  Also with normal filling pressures on echo.  She can take Lasix as needed at home.  She takes 40 mg as needed at home.  Would recommend this at discharge.  # AKI -Likely secondary to diuresis.  Holding his euvolemic.  # Hyponatremia -Resolved.  Suspect this is hypervolemic.  Euvolemic on examination now.  Sodium corrected.  Repeat labs this morning are pending.  # CAD status post POBA -Angioplasty in 2017.  Troponins negative.  Chest pain symptoms have improved with rhythm control of A-fib.  Continue home medications.  [facility] will sign off.   Medication Recommendations: As above Other recommendations (labs, testing, etc): None Follow up as an outpatient: We will arrange outpatient follow-up in 2 to 3 weeks with Dr. Duke Salvia  For questions or updates, please contact [facility] Please consult [email or web address] for contact info under        Signed, Gerri Spore T. Flora Lipps, MD, [facility]  [facility]  08/06/2022 8:21 AM

## 2022-08-06 NOTE — Anesthesia Postprocedure Evaluation (Signed)
Anesthesia Post Note  Patient: [name]  Procedure(s) Performed: CARDIOVERSION     Patient location during evaluation: Cath Lab Anesthesia Type: General Level of consciousness: awake and alert Pain management: pain level controlled Vital Signs Assessment: post-procedure vital signs reviewed and stable Respiratory status: spontaneous breathing Cardiovascular status: stable Anesthetic complications: no   No notable events documented.  Last Vitals:  Vitals:   08/06/22 2212 08/06/22 2214  BP: (!) 93/42 (!) 100/41  Pulse: 70 72  Resp:    Temp:    SpO2:      Last Pain:  Vitals:   08/06/22 2008  TempSrc: Oral  PainSc:                  Lewie Loron

## 2022-08-06 NOTE — TOC Initial Note (Addendum)
Transition of Care [facility]) - Initial/Assessment Note    Patient Details  Name: [name] [name] MRN: [id number] Date of Birth: [birth year]  [facility]) CM/SW Contact:    Leone Haven, RN Phone Number: 08/06/2022, 3:35 PM  Clinical Narrative:                 From home with spouse, she has PCP , Dr. Drue Novel and insurance on file, she is active with [facility] for HHPT, this was confirmed with Kandee Keen at [facility],  Patient would like to continue with these services.  Will need HHPT order.  She has rollator, shower chair and walker at home.  Spouse who is her support system will transport her home at dc.  She gets medications from [facility] in [place] off [address].  Will add HHRN to [facility] services.  Expected Discharge Plan: Home w Home Health Services Barriers to Discharge: Continued Medical Work up   Patient Goals and CMS Choice Patient states their goals for this hospitalization and ongoing recovery are:: return home CMS Medicare.gov Compare Post Acute Care list provided to:: Patient Choice offered to / list presented to : Patient      Expected Discharge Plan and Services In-house Referral: NA Discharge Planning Services: CM Consult Post Acute Care Choice: Home Health Living arrangements for the past 2 months: Single Family Home                 DME Arranged: N/A DME Agency: NA       HH Arranged: PT,RN HH AgencyHotel manager Home Health Care Date [facility] Agency Contacted: 08/06/22 Time HH Agency Contacted: 1534 Representative spoke with at [facility] Agency: Kandee Keen  Prior Living Arrangements/Services Living arrangements for the past 2 months: Single Family Home Lives with:: Spouse Patient language and need for interpreter reviewed:: Yes Do you feel safe going back to the place where you live?: Yes      Need for Family Participation in Patient Care: Yes (Comment) Care giver support system in place?: Yes (comment) Current home services: DME (rollator, walker , shower seat) Criminal  Activity/Legal Involvement Pertinent to Current Situation/Hospitalization: No - Comment as needed  Activities of Daily Living Home Assistive Devices/Equipment: Walker (specify type) ADL Screening (condition at time of admission) Patient's cognitive ability adequate to safely complete daily activities?: Yes Is the patient deaf or have difficulty hearing?: No Does the patient have difficulty seeing, even when wearing glasses/contacts?: No Does the patient have difficulty concentrating, remembering, or making decisions?: No Patient able to express need for assistance with ADLs?: Yes Does the patient have difficulty dressing or bathing?: No Independently performs ADLs?: Yes (appropriate for developmental age) Does the patient have difficulty walking or climbing stairs?: Yes Weakness of Legs: Both Weakness of Arms/Hands: Both  Permission Sought/Granted                  Emotional Assessment Appearance:: Appears stated age Attitude/Demeanor/Rapport: Engaged Affect (typically observed): Appropriate Orientation: : Oriented to Self, Oriented to Place, Oriented to  Time, Oriented to Situation Alcohol / Substance Use: Not Applicable Psych Involvement: No (comment)  Admission diagnosis:  Atrial fibrillation with rapid ventricular response (HCC) [I48.91] Acute on chronic congestive heart failure, unspecified heart failure type (HCC) [I50.9] A-fib (HCC) [I48.91] Patient Active Problem List   Diagnosis Date Noted   A-fib (HCC) 08/04/2022   Atrial fibrillation with rapid ventricular response (HCC) 08/03/2022   AKI (acute kidney injury) (HCC) 07/20/2022   Supratherapeutic INR 07/08/2022   Hemorrhagic cystitis 07/08/2022  Gross hematuria 07/07/2022   CKD (chronic kidney disease) stage 4, GFR 15-29 ml/min (HCC) 04/10/2020   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   Osteoporosis 07/22/2018   Closed compression fracture of L1 vertebra (HCC) 07/19/2018   Cardiomyopathy (HCC) 04/07/2018    Foot ulcer (HCC) 07/24/2016   Gram-negative infection    CKD (chronic kidney disease) stage 3, GFR 30-59 ml/min (HCC)    Postoperative anemia due to acute blood loss 01/04/2016   Left hip postoperative wound infection 01/02/2016   CAD S/P percutaneous coronary angioplasty 05/20/2015   Acute systolic CHF (congestive heart failure) (HCC) 05/20/2015   PAF (paroxysmal atrial fibrillation) (HCC) 05/20/2015   Acute kidney injury superimposed on chronic kidney disease (HCC) 05/11/2015   PCP NOTES >>>>>>>>>>>>>>>> 12/28/2014   Memory loss 05/17/2014   Diabetes (HCC) 01/23/2014   Anxiety and depression, PCP notes  11/24/2013   Severe obesity (BMI >= 40) (HCC) 07/14/2013   Encounter for therapeutic drug monitoring 06/02/2013   History of encephalitis 11/18/2012   Intertrochanteric fracture of right hip (HCC) 07/13/2012   GERD (gastroesophageal reflux disease) 10/05/2011   Seizure disorder (HCC) 05/24/2011   Annual physical exam >>>>>>>>>>>>>>>>>>>>>>>>>>>>>>>.. 02/10/2011   Atrial fibrillation (HCC)    LUNG NODULE 09/01/2006   Hyperlipidemia 03/27/2006   Hereditary and idiopathic peripheral neuropathy 03/27/2006   RETINOPATHY, BACKGROUND NOS 03/27/2006   Essential hypertension 03/27/2006   PCP:  [name], MD Pharmacy:   [facility] [address] [place], [place] - [address] [place] [address] Phone: [phone] Fax: [phone]  [facility] [address] [place], [place] - [address] [place] [address] Phone: [phone] Fax: [phone]  [facility] [address] [place] [address] Phone: [phone] Fax: [phone]     Social Determinants of Health (SDOH) Social History: SDOH Screenings   Food Insecurity: No Food Insecurity (08/04/2022)  Housing: Low Risk  (08/04/2022)  Transportation Needs: No Transportation Needs (08/04/2022)  Utilities: Not At Risk (08/04/2022)   Alcohol Screen: Low Risk  (08/22/2020)  Depression (PHQ2-9): Low Risk  (05/05/2022)  Financial Resource Strain: Low Risk  (08/22/2020)  Physical Activity: Inactive (08/22/2020)  Social Connections: Moderately Integrated (08/22/2020)  Stress: No Stress Concern Present (08/22/2020)  Tobacco Use: Medium Risk (08/06/2022)   SDOH Interventions:     Readmission Risk Interventions    08/06/2022    3:31 PM 07/23/2022   12:43 PM  Readmission Risk Prevention Plan  Transportation Screening Complete Complete  PCP or Specialist Appt within 5-7 Days  Complete  PCP or Specialist Appt within 3-5 Days Complete   Home Care Screening  Complete  Medication Review (RN CM)  Referral to Pharmacy  HRI or Home Care Consult Complete   Palliative Care Screening Not Applicable   Medication Review (RN Care Manager) Complete

## 2022-08-06 NOTE — Progress Notes (Signed)
   Heart Failure Stewardship Pharmacist Progress Note   PCP: [name], MD PCP-Cardiologist: Chilton Si, MD    HPI:  82 yo F with PMH of T2DM, CKD IV, CHF/ICM, afib, HTN, HLD, seizures, blindness in L eye, and recent ESBL UTI.   Presented to the ED on 6/3 following PCP appt where she was found to have chest pain, shortness of breath, and nausea. In the ED, was found to be in afib RVR and fluid overloaded. CXR without active cardiopulmonary disease. ECHO 6/5 showed LVEF 35-40%, global hypokinesis, RV normal, and MV echodensity (if clinical concern, recommend TEE to rule out endocarditis). S/p successful DCCV on 6/5. Brief afib overnight but return of NSR on amiodarone.   Current HF Medications: Beta Blocker: metoprolol XL 50 mg daily Other: hydralazine 25 mg TID + Imdur 30 mg daily  Prior to admission HF Medications: Diuretic: furosemide 40 mg daily PRN Beta blocker: metoprolol XL 50 mg daily  Pertinent Lab Values: Serum creatinine 2.60, BUN 30, Potassium 4.1, Sodium 129, BNP 1094.9, Magnesium 2.1   Vital Signs: Weight: 171 lbs (admission weight: 171 lbs) Blood pressure: 120/60s  Heart rate: 60s  I/O: net even  Medication Assistance / Insurance Benefits Check: Does the patient have prescription insurance?  Yes Type of insurance plan: BCBS Medicare  Outpatient Pharmacy:  Prior to admission outpatient pharmacy: [facility] Is the patient willing to use [facility] TOC pharmacy at discharge? Yes Is the patient willing to transition their outpatient pharmacy to utilize a [facility] outpatient pharmacy?   No    Assessment: 1. Acute on chronic systolic CHF (LVEF 35-40%), due to presumed NICM. NYHA class II symptoms. - Off IV lasix. Consider restarting PRN lasix at discharge. Strict I/Os and daily weights. Keep K>4 and Mg>2. - Continue metoprolol XL 50 mg daily - No ACE/ARB/ARNI or MRA with CKD IV - No SGLT2i with ESBL UTI - Agree with adding hydralazine 25 mg TID + Imdur 60 mg  daily   Plan: 1) Medication changes recommended at this time: - Agree with changes  2) Patient assistance: - None pending  3)  Education  - Patient has been educated on current HF medications and potential additions to HF medication regimen - Patient verbalizes understanding that over the next few months, these medication doses may change and more medications may be added to optimize HF regimen - Patient has been educated on basic disease state pathophysiology and goals of therapy   Sharen Hones, PharmD, BCPS Heart Failure Stewardship Pharmacist Phone [phone]

## 2022-08-07 ENCOUNTER — Telehealth: Payer: Self-pay

## 2022-08-07 ENCOUNTER — Inpatient Hospital Stay (HOSPITAL_COMMUNITY): Payer: Medicare Other

## 2022-08-07 DIAGNOSIS — M8580 Other specified disorders of bone density and structure, unspecified site: Secondary | ICD-10-CM

## 2022-08-07 DIAGNOSIS — E785 Hyperlipidemia, unspecified: Secondary | ICD-10-CM

## 2022-08-07 DIAGNOSIS — N179 Acute kidney failure, unspecified: Secondary | ICD-10-CM

## 2022-08-07 DIAGNOSIS — I5022 Chronic systolic (congestive) heart failure: Secondary | ICD-10-CM

## 2022-08-07 DIAGNOSIS — K8020 Calculus of gallbladder without cholecystitis without obstruction: Secondary | ICD-10-CM

## 2022-08-07 DIAGNOSIS — D689 Coagulation defect, unspecified: Secondary | ICD-10-CM

## 2022-08-07 DIAGNOSIS — I255 Ischemic cardiomyopathy: Secondary | ICD-10-CM

## 2022-08-07 DIAGNOSIS — Z87891 Personal history of nicotine dependence: Secondary | ICD-10-CM

## 2022-08-07 DIAGNOSIS — I471 Supraventricular tachycardia, unspecified: Secondary | ICD-10-CM

## 2022-08-07 DIAGNOSIS — Z7901 Long term (current) use of anticoagulants: Secondary | ICD-10-CM

## 2022-08-07 DIAGNOSIS — N132 Hydronephrosis with renal and ureteral calculous obstruction: Secondary | ICD-10-CM

## 2022-08-07 DIAGNOSIS — I4891 Unspecified atrial fibrillation: Secondary | ICD-10-CM | POA: Diagnosis not present

## 2022-08-07 DIAGNOSIS — H5462 Unqualified visual loss, left eye, normal vision right eye: Secondary | ICD-10-CM

## 2022-08-07 DIAGNOSIS — I700 Atherosclerosis of aorta: Secondary | ICD-10-CM

## 2022-08-07 DIAGNOSIS — Z7983 Long term (current) use of bisphosphonates: Secondary | ICD-10-CM

## 2022-08-07 DIAGNOSIS — E11319 Type 2 diabetes mellitus with unspecified diabetic retinopathy without macular edema: Secondary | ICD-10-CM

## 2022-08-07 DIAGNOSIS — M5030 Other cervical disc degeneration, unspecified cervical region: Secondary | ICD-10-CM

## 2022-08-07 DIAGNOSIS — Z9181 History of falling: Secondary | ICD-10-CM

## 2022-08-07 DIAGNOSIS — Z8673 Personal history of transient ischemic attack (TIA), and cerebral infarction without residual deficits: Secondary | ICD-10-CM

## 2022-08-07 DIAGNOSIS — I5021 Acute systolic (congestive) heart failure: Secondary | ICD-10-CM | POA: Diagnosis not present

## 2022-08-07 DIAGNOSIS — I130 Hypertensive heart and chronic kidney disease with heart failure and stage 1 through stage 4 chronic kidney disease, or unspecified chronic kidney disease: Secondary | ICD-10-CM

## 2022-08-07 DIAGNOSIS — M1611 Unilateral primary osteoarthritis, right hip: Secondary | ICD-10-CM

## 2022-08-07 DIAGNOSIS — Z87440 Personal history of urinary (tract) infections: Secondary | ICD-10-CM

## 2022-08-07 DIAGNOSIS — Z794 Long term (current) use of insulin: Secondary | ICD-10-CM

## 2022-08-07 DIAGNOSIS — E1122 Type 2 diabetes mellitus with diabetic chronic kidney disease: Secondary | ICD-10-CM

## 2022-08-07 DIAGNOSIS — N184 Chronic kidney disease, stage 4 (severe): Secondary | ICD-10-CM

## 2022-08-07 DIAGNOSIS — I480 Paroxysmal atrial fibrillation: Secondary | ICD-10-CM

## 2022-08-07 LAB — GLUCOSE, CAPILLARY
Glucose-Capillary: 335 mg/dL — ABNORMAL HIGH (ref 70–99)
Glucose-Capillary: 136 mg/dL — ABNORMAL HIGH (ref 70–99)
Glucose-Capillary: 127 mg/dL — ABNORMAL HIGH (ref 70–99)
Glucose-Capillary: 111 mg/dL — ABNORMAL HIGH (ref 70–99)
Glucose-Capillary: 179 mg/dL — ABNORMAL HIGH (ref 70–99)

## 2022-08-07 LAB — CBC
HCT: 27.7 % — ABNORMAL LOW (ref 36.0–46.0)
Hemoglobin: 8.9 g/dL — ABNORMAL LOW (ref 12.0–15.0)
MCH: 28.9 pg (ref 26.0–34.0)
MCHC: 32.1 g/dL (ref 30.0–36.0)
MCV: 89.9 fL (ref 80.0–100.0)
Platelets: 619 10*3/uL — ABNORMAL HIGH (ref 150–400)
RBC: 3.08 MIL/uL — ABNORMAL LOW (ref 3.87–5.11)
RDW: 13.6 % (ref 11.5–15.5)
WBC: 23.7 10*3/uL — ABNORMAL HIGH (ref 4.0–10.5)
nRBC: 0 % (ref 0.0–0.2)

## 2022-08-07 LAB — BASIC METABOLIC PANEL
Anion gap: 13 (ref 5–15)
BUN: 33 mg/dL — ABNORMAL HIGH (ref 8–23)
CO2: 22 mmol/L (ref 22–32)
Calcium: 7.9 mg/dL — ABNORMAL LOW (ref 8.9–10.3)
Chloride: 95 mmol/L — ABNORMAL LOW (ref 98–111)
Creatinine, Ser: 2.88 mg/dL — ABNORMAL HIGH (ref 0.44–1.00)
GFR, Estimated: 16 mL/min — ABNORMAL LOW (ref >60)
Glucose, Bld: 136 mg/dL — ABNORMAL HIGH (ref 70–99)
Potassium: 4.4 mmol/L (ref 3.5–5.1)
Sodium: 130 mmol/L — ABNORMAL LOW (ref 135–145)

## 2022-08-07 LAB — URINALYSIS, W/ REFLEX TO CULTURE (INFECTION SUSPECTED)
Bilirubin Urine: NEGATIVE
Glucose, UA: NEGATIVE mg/dL
Ketones, ur: NEGATIVE mg/dL
Nitrite: NEGATIVE
Protein, ur: 100 mg/dL — AB
Specific Gravity, Urine: 1.013 (ref 1.005–1.030)
WBC, UA: >50 WBC/hpf (ref 0–5)
pH: 7 (ref 5.0–8.0)

## 2022-08-07 LAB — PROTIME-INR
INR: 4 — ABNORMAL HIGH (ref 0.8–1.2)
Prothrombin Time: 39.2 seconds — ABNORMAL HIGH (ref 11.4–15.2)

## 2022-08-07 MED ORDER — WARFARIN 0.5 MG HALF TABLET
0.5000 mg | ORAL_TABLET | Freq: Once | ORAL | Status: AC
  Administered 2022-08-07: 0.5 mg via ORAL
  Filled 2022-08-07: qty 1

## 2022-08-07 MED ORDER — PANTOPRAZOLE SODIUM 40 MG IV SOLR
40.0000 mg | Freq: Two times a day (BID) | INTRAVENOUS | Status: DC
  Administered 2022-08-07 – 2022-08-10 (×7): 40 mg via INTRAVENOUS
  Filled 2022-08-07 (×7): qty 10

## 2022-08-07 MED ORDER — SODIUM CHLORIDE 0.9 % IV SOLN
1.0000 g | Freq: Two times a day (BID) | INTRAVENOUS | Status: DC
  Administered 2022-08-07 – 2022-08-08 (×4): 1 g via INTRAVENOUS
  Filled 2022-08-07 (×5): qty 20

## 2022-08-07 NOTE — Consult Note (Signed)
   [facility] CM Inpatient Consult   08/07/2022  [name] [name] [birth year] [id number]  [facility] [[facility]]  Accountable Care Organization [ACO] Patient:  Medicare ACO REACH  Primary Care Provider: [name], MD, [place] [facility] is listed for the transition of care follow up.  Follow up at bedside and met at bedside, PPE donned and kept contact precautions   Patient will receive post hospital discharge call and will be evaluated for complex disease management.  Patient was given a reminder card with the 24 hour nurse advise line.  Patient agreeable for follow up for post hospital transition needs.    Of note, [facility] Care Management services does not replace or interfere with any services that are arranged by inpatient case management or social work.  For additional questions or referrals please contact:    Charlesetta Shanks, RN BSN CCM [facility]  [phone] business mobile phone Toll free office [phone]  *Concierge Line  [phone] Fax number: [phone] [email or web address]

## 2022-08-07 NOTE — Progress Notes (Signed)
   Heart Failure Stewardship Pharmacist Progress Note   PCP: [name], MD PCP-Cardiologist: Chilton Si, MD    HPI:  82 yo F with PMH of T2DM, CKD IV, CHF/ICM, afib, HTN, HLD, seizures, blindness in L eye, and recent ESBL UTI.   Presented to the ED on 6/3 following PCP appt where she was found to have chest pain, shortness of breath, and nausea. In the ED, was found to be in afib RVR and fluid overloaded. CXR without active cardiopulmonary disease. ECHO 6/5 showed LVEF 35-40%, global hypokinesis, RV normal, and MV echodensity (if clinical concern, recommend TEE to rule out endocarditis). S/p successful DCCV on 6/5. Brief afib overnight 6/5 but return of NSR on amiodarone.   Current HF Medications: Beta Blocker: metoprolol XL 50 mg daily Other: hydralazine 25 mg TID + Imdur 30 mg daily  Prior to admission HF Medications: Diuretic: furosemide 40 mg daily PRN Beta blocker: metoprolol XL 50 mg daily  Pertinent Lab Values: Serum creatinine 2.88, BUN 33, Potassium 4.4, Sodium 130, BNP 1094.9, Magnesium 2.1   Vital Signs: Weight: 172 lbs (admission weight: 171 lbs) Blood pressure: 110/60s  Heart rate: 60-70s  I/O: net even  Medication Assistance / Insurance Benefits Check: Does the patient have prescription insurance?  Yes Type of insurance plan: BCBS Medicare  Outpatient Pharmacy:  Prior to admission outpatient pharmacy: [facility] Is the patient willing to use [facility] TOC pharmacy at discharge? Yes Is the patient willing to transition their outpatient pharmacy to utilize a [facility] outpatient pharmacy?   No    Assessment: 1. Acute on chronic systolic CHF (LVEF 35-40%), due to presumed NICM. NYHA class II symptoms. - Off IV lasix. Consider restarting PRN lasix at discharge. Strict I/Os and daily weights. Keep K>4 and Mg>2. - Continue metoprolol XL 50 mg daily - No ACE/ARB/ARNI or MRA with CKD IV - No SGLT2i with ESBL UTI - Continue hydralazine 25 mg TID + Imdur 60 mg  daily   Plan: 1) Medication changes recommended at this time: - Restart PRN lasix for discharge  2) Patient assistance: - None pending  3)  Education  - Patient has been educated on current HF medications and potential additions to HF medication regimen - Patient verbalizes understanding that over the next few months, these medication doses may change and more medications may be added to optimize HF regimen - Patient has been educated on basic disease state pathophysiology and goals of therapy   Sharen Hones, PharmD, BCPS Heart Failure Stewardship Pharmacist Phone [phone]

## 2022-08-07 NOTE — Progress Notes (Signed)
Pt was found to have IV still running Amiodarone at the RN's start of the shift, RN stopped IV since it was d/c earlier this morning, on call MD notified, will continue to monitor, Thanks Lavonda Jumbo RN

## 2022-08-07 NOTE — Progress Notes (Signed)
Pharmacy Antibiotic Note  [name] [name] is a 82 y.o. female admitted on 08/03/2022 with Atrial flutter with RVR.  Pharmacy has been consulted on 08/07/2022  for Meropenem dosing for ESBL infection due to worsening leukocytosis.    Today 6/7 her WBC have increased to 23.7. Afebrile, TM 99.1 No cultures this admission.  Recent UTI completed Bactrim 07/10/22- 07/23/22 for ESBL klebsiella UTI  (Urine culture on 07/20/22).  Fosfomycin 3g po x1 given this admission 6/5  No cultures this admit as of 6/7. MD plans to check urine culture today.   AKI on CKD stage IV:  SCr has trending up to 2.88.  Crcl 16.3 ml/min.   Plan: Meropenem 1g IV every 12 hours Monitor clinical status, renal function and culture results daily.      Height: 5\' 9"  (175.3 cm) Weight: 78.4 kg (172 lb 13.5 oz) IBW/kg (Calculated) : 66.2  Temp (24hrs), Avg:98.6 F (37 C), Min:97.7 F (36.5 C), Max:99.1 F (37.3 C)  Recent Labs  Lab 08/03/22 1655 08/04/22 0001 08/05/22 0108 08/06/22 1103 08/07/22 0124  WBC 14.6*  --  12.7* 10.3 23.7*  CREATININE 2.41* 2.41* 2.63* 2.60* 2.88*    Estimated Creatinine Clearance: 16.3 mL/min (A) (by C-G formula based on SCr of 2.88 mg/dL (H)).    Allergies  Allergen Reactions   Procaine Hcl Anaphylaxis   Amoxicillin Itching    Antimicrobials this admission:   Meropenem:  6/7>>  Dose adjustments this admission: n/a   Microbiology results: None this admit currently   Thank you for allowing pharmacy to be a part of this patient's care.  Noah Delaine, RPh Clinical Pharmacist  08/07/2022 12:58 PM

## 2022-08-07 NOTE — Progress Notes (Signed)
ANTICOAGULATION CONSULT NOTE  Pharmacy Consult for warfarin  Indication: atrial fibrillation  Allergies  Allergen Reactions   Procaine Hcl Anaphylaxis   Amoxicillin Itching    Patient Measurements: Height: 5\' 9"  (175.3 cm) Weight: 78.4 kg (172 lb 13.5 oz) IBW/kg (Calculated) : 66.2  Vital Signs: Temp: 97.7 F (36.5 C) (06/07 0745) Temp Source: Oral (06/07 0745) BP: 112/93 (06/07 0957) Pulse Rate: 71 (06/07 0957)  Labs: Recent Labs    08/05/22 0108 08/06/22 1103 08/06/22 1649 08/06/22 1832 08/07/22 0124  HGB 8.7* 8.6*  --   --  8.9*  HCT 27.2* 26.4*  --   --  27.7*  PLT 577* 564*  --   --  619*  LABPROT 40.7* 39.2*  --   --  39.2*  INR 4.2* 4.0*  --   --  4.0*  CREATININE 2.63* 2.60*  --   --  2.88*  TROPONINIHS  --   --  13 16  --      Estimated Creatinine Clearance: 16.3 mL/min (A) (by C-G formula based on SCr of 2.88 mg/dL (H)).  Assessment: Patient admitted with CC of chest pain, recent admission for AKI. Was found to be in Afib with RVR in ED w fluid overload. She is on chronic warfarin for afib. INR on admission was elevated at 3.9.   PTA  Warfarin dose was 1.25 mg daily, last taken 08/03/22 @ 0800 prior to admission.   Warfarin  pharmacy consult was ordered on 08/03/22 and we have held warfarin doses for past 3 days due to INR remained supratherapeutic.  INR trend since admission is 3.9>4.3>4.2>4.0>4.0 , unchanged today.   Hgb 8.9 low/stable,  plts elevated in 500s-600s stable , No bleeding reported.  Currently on amiodarone since 08/04/22,  which is likely to  increase warfarin hypoprothrombinemic effect. Warfarin low dose ordered to give yesterday 6/6, however charted as not given. Today INR supratherapeutic, but has been trending down and no doses given since 6/3 PTA.   Will give low dose of 0.5 mg today,  to avoid INR dropping below goal 2-3.   I anticipate she will need lower warfarin dose than PTA dose, now that she is taking amiodarone. May only need  warfarin dose every other day or every few days.   She will need close INR follow up after discharge until on stable warfarin regimen.   Goal of Therapy:  INR 2-3 Monitor platelets by anticoagulation protocol: Yes   Plan:  Give Warfarin 0.5 mg today x1.  Daily INR  Thank you for allowing pharmacy to be part of this patients care team. Noah Delaine, RPh Clinical Pharmacist 08/07/2022 10:53 AM  Please check AMION for all [facility] Pharmacy phone numbers After 10:00 PM, call Main Pharmacy [phone]

## 2022-08-07 NOTE — Progress Notes (Addendum)
PROGRESS NOTE    LOURENE MULHERN  ONG:[id number] DOB: 1940-12-02 DOA: 08/03/2022 PCP: [name], MD   Brief Narrative:  [name] is a 82 y.o. female with medical history significant of PAF on Coumadin, ischemic cardiomyopathy with LVEF 45-50%, chronic HFrEF, CKD stage IV, IDDM with insulin resistance, seizure disorder, blindness of left eye recent ESBL UTI, presented with rapid A-fib and worsening of LVEF.  Patient has been admitted with chest pain secondary to paroxysmal atrial flutter/fibrillation and has been started on IV amiodarone per cardiology.  She is also noted to have acute on chronic HFrEF as a result of this.  Underwent DCCV  6/05.  She also appears to have UTI with recent Klebsiella ESBL infection, received fosfomycin 6/05.  Patient develops chest pain 6/06, GI source, troponin negative,, resolved with Maalox.  She report episode of vomiting today. WBC has increase to 23 K. Plan to proceed with UA and Chest x ray.    Assessment & Plan:   Principal Problem:   Atrial fibrillation with rapid ventricular response (HCC) Active Problems:   Acute systolic CHF (congestive heart failure) (HCC)   Cardiomyopathy (HCC)   CKD (chronic kidney disease) stage 4, GFR 15-29 ml/min (HCC)   A-fib (HCC)  Assessment and Plan: 1-Atrial flutter with RVR -With worsening of LVEF, cardiology started patient on amiodarone -INR= 4.0  pharmacy following. Pharmacy order 0.5 mg coumadin for today.  -Continue metoprolol succinate -Underwent DCCV 6/05. Had episode of A fib overnight back in sinus.  -She has been transition to oral Amiodarone.   Acute on chronic HFrEF decompensation -Was found to be fluid overload initially on arrival at ED, received 60 mg IV Lasix.  Right now appears to be euvolemic.  -On Lasix PRN.   Mild AKI on CKD stage IV Prior Cr 2.4 Related to diuresis, hold off on further diuresis at this time \\Mild  increase cr to 2.8.  If continue to vomit, will give gentle  hydration.   UTI with leukocytosis - Noted patient was treated for UTI recently completed Bactrim on May 25. -Appears to have signs of UTI -Prior urine culture with ESBL Klebsiella infection 5/20 -Received  fosfomycin 6/05 -Worsening leukocytosis 6/07, will check Urine culture. Will start Imipenem.   Vomiting;  Plan to monitor.  Change PPI to IV  Coumadin induced coagulopathy -No overt symptoms signs of bleeding, hold off Coumadin    IDDM with insulin resistance -Labile glucose control, hold off 70/30 -Continue sliding scale ordered by cardiology -Added  Januvia Started  low dose Semglee.    Hyponatremia-improving -Chronic, probably secondary to CHF, 1 dose of Lasix given,  Chronic anemia secondary to -H&H stable, no symptoms or signs of active bleeding.   Chest pain;  Report chest pain this afternoon. Report pain on and off for 3 years. They dont found anything.  She was tender on palpation.  Started  PPI, Maalox.  Troponin negative.    DVT prophylaxis:Warfarin Code Status: Discussed with patient she wishes to be Full code.  Family Communication: None at bedside; Cardiology has discussed with husband. Patient politely decline I update her husband.  Disposition Plan: DCCV today Status is: Inpatient Remains inpatient appropriate because: Need for IV medications   Consultants:  Cardiology  Procedures:  Anticipated DCCV today  Antimicrobials:  None   Subjective: She vomited this am. Report resolution of epigastric pain.  Had BM  Objective: Vitals:   08/07/22 0745 08/07/22 0817 08/07/22 0957 08/07/22 1105  BP: (!) 119/52 131/67 (!) 112/93 [name])  112/50  Pulse: 74  71 70  Resp: 17 20 16 19   Temp: 97.7 F (36.5 C)   98 F (36.7 C)  TempSrc: Oral   Axillary  SpO2: 97%  98% 98%  Weight:      Height:        Intake/Output Summary (Last 24 hours) at 08/07/2022 1242 Last data filed at 08/07/2022 1242 Gross per 24 hour  Intake 123 ml  Output 200 ml  Net -77 ml     Filed Weights   08/05/22 0500 08/06/22 0443 08/07/22 0419  Weight: 78 kg 78 kg 78.4 kg    Examination:  General exam: NAD Respiratory system: CTA Cardiovascular system: S 1, S 2 RRR Gastrointestinal system: BS present, soft, nt Central nervous system: Alert, follows command Extremities: No edema   Data Reviewed: I have personally reviewed following labs and imaging studies  CBC: Recent Labs  Lab 08/03/22 1655 08/05/22 0108 08/06/22 1103 08/07/22 0124  WBC 14.6* 12.7* 10.3 23.7*  NEUTROABS 10.8*  --   --   --   HGB 9.3* 8.7* 8.6* 8.9*  HCT 28.9* 27.2* 26.4* 27.7*  MCV 89.2 90.7 88.0 89.9  PLT 662* 577* 564* 619*    Basic Metabolic Panel: Recent Labs  Lab 08/03/22 1655 08/04/22 0001 08/04/22 0541 08/04/22 1518 08/05/22 0108 08/06/22 1103 08/07/22 0124  NA 127* 128*  --   --  133* 129* 130*  K 3.7 3.5  --   --  4.5 4.1 4.4  CL 89* 92*  --   --  94* 93* 95*  CO2 25 26  --   --  29 26 22   GLUCOSE 313* 298*  --   --  262* 260* 136*  BUN 29* 29*  --   --  27* 30* 33*  CREATININE 2.41* 2.41*  --   --  2.63* 2.60* 2.88*  CALCIUM 7.9* 7.6*  --   --  7.9* 7.7* 7.9*  MG  --   --  1.9 2.1  --   --   --   PHOS  --   --   --  2.5  --   --   --     GFR: Estimated Creatinine Clearance: 16.3 mL/min (A) (by C-G formula based on SCr of 2.88 mg/dL (H)). Liver Function Tests: Recent Labs  Lab 08/03/22 1655  AST 51*  ALT 21  ALKPHOS 101  BILITOT 0.3  PROT 7.4  ALBUMIN 2.1*    Recent Labs  Lab 08/03/22 1655  LIPASE 21    No results for input(s): "AMMONIA" in the last 168 hours. Coagulation Profile: Recent Labs  Lab 08/03/22 1708 08/04/22 0541 08/05/22 0108 08/06/22 1103 08/07/22 0124  INR 3.9* 4.3* 4.2* 4.0* 4.0*    Cardiac Enzymes: No results for input(s): "CKTOTAL", "CKMB", "CKMBINDEX", "TROPONINI" in the last 168 hours. BNP (last 3 results) No results for input(s): "PROBNP" in the last 8760 hours. HbA1C: No results for input(s): "HGBA1C" in  the last 72 hours. CBG: Recent Labs  Lab 08/06/22 1111 08/06/22 1539 08/06/22 2047 08/07/22 0558 08/07/22 1108  GLUCAP 259* 335* 129* 136* 127*    Lipid Profile: No results for input(s): "CHOL", "HDL", "LDLCALC", "TRIG", "CHOLHDL", "LDLDIRECT" in the last 72 hours. Thyroid Function Tests: Recent Labs    08/04/22 1518  TSH 0.715    Anemia Panel: No results for input(s): "VITAMINB12", "FOLATE", "FERRITIN", "TIBC", "IRON", "RETICCTPCT" in the last 72 hours. Sepsis Labs: No results for input(s): "PROCALCITON", "LATICACIDVEN" in the last 168  hours.  No results found for this or any previous visit (from the past 240 hour(s)).       Radiology Studies: ECHOCARDIOGRAM COMPLETE  Result Date: 08/05/2022    ECHOCARDIOGRAM REPORT   Patient Name:   ADYLINE HUBERTY Date of Exam: 08/05/2022 Medical Rec #:  [id number]         Height:       69.0 in Accession #:    [id number]        Weight:       172.0 lb Date of Birth:  1940-04-03        BSA:          1.938 m Patient Age:    81 years          BP:           117/57 mmHg Patient Gender: F                 HR:           107 bpm. Exam Location:  Inpatient Procedure: 2D Echo, Color Doppler and Cardiac Doppler Indications:    acute systolic chf  History:        Patient has prior history of Echocardiogram examinations, most                 recent 11/06/2020. Cardiomyopathy, CAD, chronic kidney disease,                 Arrythmias:Atrial Fibrillation; Risk Factors:Hypertension,                 Dyslipidemia and Diabetes.  Sonographer:    Delcie Roch RDCS Referring Phys: [phone] DEBBY CROSLEY  Sonographer Comments: Image acquisition challenging due to patient body habitus. IMPRESSIONS  1. Left ventricular ejection fraction, by estimation, is 35 to 40%. The left ventricle has moderately decreased function. The left ventricle demonstrates global hypokinesis. Left ventricular diastolic function could not be evaluated.  2. Right ventricular systolic function is  normal. The right ventricular size is normal. There is normal pulmonary artery systolic pressure. The estimated right ventricular systolic pressure is 29.4 mmHg.  3. Left atrial size was moderately dilated.  4. There is a mobile echo bright denisty on the anterior mitral valve leaflet that likely represents calcification. If clinical concern for endocarditis, recommend TEE>. The mitral valve is degenerative. Mild mitral valve regurgitation. No evidence of mitral stenosis. Moderate mitral annular calcification.  5. The aortic valve is normal in structure. Aortic valve regurgitation is not visualized. No aortic stenosis is present.  6. The inferior vena cava is normal in size with greater than 50% respiratory variability, suggesting right atrial pressure of 3 mmHg. FINDINGS  Left Ventricle: Left ventricular ejection fraction, by estimation, is 35 to 40%. The left ventricle has moderately decreased function. The left ventricle demonstrates global hypokinesis. The left ventricular internal cavity size was normal in size. There is no left ventricular hypertrophy. Left ventricular diastolic function could not be evaluated. Right Ventricle: The right ventricular size is normal. No increase in right ventricular wall thickness. Right ventricular systolic function is normal. There is normal pulmonary artery systolic pressure. The tricuspid regurgitant velocity is 2.57 m/s, and  with an assumed right atrial pressure of 3 mmHg, the estimated right ventricular systolic pressure is 29.4 mmHg. Left Atrium: Left atrial size was moderately dilated. Right Atrium: Right atrial size was normal in size. Pericardium: There is no evidence of pericardial effusion. Mitral Valve: There is a mobile echo bright denisty on  the anterior mitral valve leaflet that likely represents calcification. If clinical concern for endocarditis, recommend TEE>. The mitral valve is degenerative in appearance. There is mild calcification of the mitral valve  leaflet(s). Moderate mitral annular calcification. Mild mitral valve regurgitation. No evidence of mitral valve stenosis. Tricuspid Valve: The tricuspid valve is normal in structure. Tricuspid valve regurgitation is trivial. No evidence of tricuspid stenosis. Aortic Valve: The aortic valve is normal in structure. Aortic valve regurgitation is not visualized. No aortic stenosis is present. Pulmonic Valve: The pulmonic valve was normal in structure. Pulmonic valve regurgitation is not visualized. No evidence of pulmonic stenosis. Aorta: The aortic root is normal in size and structure. Venous: The inferior vena cava is normal in size with greater than 50% respiratory variability, suggesting right atrial pressure of 3 mmHg. IAS/Shunts: No atrial level shunt detected by color flow Doppler.  LEFT VENTRICLE PLAX 2D LVIDd:         4.60 cm LVIDs:         3.80 cm LV PW:         1.10 cm LV IVS:        1.00 cm LVOT diam:     1.90 cm LV SV:         37 LV SV Index:   19 LVOT Area:     2.84 cm  RIGHT VENTRICLE          IVC RV Basal diam:  2.80 cm  IVC diam: 1.80 cm LEFT ATRIUM              Index        RIGHT ATRIUM           Index LA diam:        4.20 cm  2.17 cm/m   RA Area:     13.50 cm LA Vol (A2C):   107.0 ml 55.22 ml/m  RA Volume:   29.60 ml  15.28 ml/m LA Vol (A4C):   72.2 ml  37.26 ml/m LA Biplane Vol: 88.0 ml  45.42 ml/m  AORTIC VALVE LVOT Vmax:   67.70 cm/s LVOT Vmean:  44.000 cm/s LVOT VTI:    0.131 m  AORTA Ao Root diam: 3.30 cm Ao Asc diam:  3.20 cm TRICUSPID VALVE TR Peak grad:   26.4 mmHg TR Vmax:        257.00 cm/s  SHUNTS Systemic VTI:  0.13 m Systemic Diam: 1.90 cm Armanda Magic MD Electronically signed by Armanda Magic MD Signature Date/Time: 08/05/2022/8:51:01 PM    Final    EP STUDY  Result Date: 08/05/2022 See surgical note for result.       Scheduled Meds:  amiodarone  400 mg Oral BID   Followed by   Melene Muller ON 08/13/2022] amiodarone  200 mg Oral Daily   atorvastatin  80 mg Oral Daily    ezetimibe  10 mg Oral Daily   hydrALAZINE  25 mg Oral Q8H   insulin aspart  0-15 Units Subcutaneous TID WC   insulin aspart  0-5 Units Subcutaneous QHS   insulin glargine-yfgn  10 Units Subcutaneous Daily   isosorbide mononitrate  30 mg Oral Daily   linagliptin  5 mg Oral Daily   metoprolol succinate  50 mg Oral Daily   pantoprazole (PROTONIX) IV  40 mg Intravenous Q12H   sodium chloride flush  3 mL Intravenous Q12H   warfarin  0.5 mg Oral ONCE-1600   Warfarin - Pharmacist Dosing Inpatient   Does not apply q1600  Continuous Infusions:  sodium chloride 0 mL/hr at 08/03/22 1823   sodium chloride       LOS: 3 days    Time spent: 35 minutes    Ainslie Mazurek A Vernida Mcnicholas, DO [facility]  If 7PM-7AM, please contact night-coverage [email or web address] 08/07/2022, 12:42 PM

## 2022-08-07 NOTE — Care Management Important Message (Signed)
Important Message  Patient Details  Name: [name] [name] MRN: [id number] Date of Birth: 08/23/1940   Medicare Important Message Given:  Yes     Renie Ora 08/07/2022, 8:57 AM

## 2022-08-07 NOTE — Progress Notes (Signed)
Cardiology Progress Note  Patient ID: [name] [name] MRN: [id number] DOB: [birth year] Date of Encounter: 08/07/2022  Primary Cardiologist: Chilton Si, MD  Subjective   Chief Complaint: None.  HPI: Chest pain yesterday.  Troponins negative.  EKG unchanged.  Staying in sinus rhythm.  Given GI cocktail.  Started on Protonix.  ROS:  All other ROS reviewed and negative. Pertinent positives noted in the HPI.     Inpatient Medications  Scheduled Meds:  amiodarone  400 mg Oral BID   Followed by   Melene Muller ON 08/13/2022] amiodarone  200 mg Oral Daily   atorvastatin  80 mg Oral Daily   ezetimibe  10 mg Oral Daily   hydrALAZINE  25 mg Oral Q8H   insulin aspart  0-15 Units Subcutaneous TID WC   insulin aspart  0-5 Units Subcutaneous QHS   insulin glargine-yfgn  10 Units Subcutaneous Daily   isosorbide mononitrate  30 mg Oral Daily   linagliptin  5 mg Oral Daily   metoprolol succinate  50 mg Oral Daily   pantoprazole  40 mg Oral BID   sodium chloride flush  3 mL Intravenous Q12H   warfarin  1 mg Oral ONCE-1600   Warfarin - Pharmacist Dosing Inpatient   Does not apply q1600   Continuous Infusions:  sodium chloride 0 mL/hr at 08/03/22 1823   sodium chloride     PRN Meds: sodium chloride, sodium chloride, acetaminophen, nitroGLYCERIN, ondansetron (ZOFRAN) IV, sodium chloride flush   Vital Signs   Vitals:   08/07/22 0419 08/07/22 0609 08/07/22 0745 08/07/22 0817  BP: (!) 130/52 (!) 117/48 (!) 119/52 131/67  Pulse: 75 72 74   Resp: 14  17 20   Temp: 98.9 F (37.2 C)  97.7 F (36.5 C)   TempSrc: Oral  Oral   SpO2: 95%  97%   Weight: 78.4 kg     Height:        Intake/Output Summary (Last 24 hours) at 08/07/2022 0819 Last data filed at 08/07/2022 0751 Gross per 24 hour  Intake 240 ml  Output 400 ml  Net -160 ml      08/07/2022    4:19 AM 08/06/2022    4:43 AM 08/05/2022    5:00 AM  Last 3 Weights  Weight (lbs) 172 lb 13.5 oz 171 lb 15.3 oz 171 lb 15.3 oz  Weight (kg) 78.4  kg 78 kg 78 kg      Telemetry  Overnight telemetry shows sinus rhythm 70s, which I personally reviewed.   ECG  The most recent ECG shows sinus rhythm 68, nonspecific ST-T changes, PVC noted, which I personally reviewed.   Physical Exam   Vitals:   08/07/22 0419 08/07/22 0609 08/07/22 0745 08/07/22 0817  BP: (!) 130/52 (!) 117/48 (!) 119/52 131/67  Pulse: 75 72 74   Resp: 14  17 20   Temp: 98.9 F (37.2 C)  97.7 F (36.5 C)   TempSrc: Oral  Oral   SpO2: 95%  97%   Weight: 78.4 kg     Height:        Intake/Output Summary (Last 24 hours) at 08/07/2022 0819 Last data filed at 08/07/2022 0751 Gross per 24 hour  Intake 240 ml  Output 400 ml  Net -160 ml       08/07/2022    4:19 AM 08/06/2022    4:43 AM 08/05/2022    5:00 AM  Last 3 Weights  Weight (lbs) 172 lb 13.5 oz 171 lb 15.3 oz 171 lb 15.3 oz  Weight (kg) 78.4 kg 78 kg 78 kg    Body mass index is 25.52 kg/m.  General: Well nourished, well developed, in no acute distress Head: Atraumatic, normal size  Eyes: PEERLA, EOMI  Neck: Supple, no JVD Endocrine: No thryomegaly Cardiac: Normal S1, S2; RRR; no murmurs, rubs, or gallops Lungs: Clear to auscultation bilaterally, no wheezing, rhonchi or rales  Abd: Soft, nontender, no hepatomegaly  Ext: No edema, pulses 2+ Musculoskeletal: No deformities, BUE and BLE strength normal and equal Skin: Warm and dry, no rashes   Neuro: Alert and oriented to person, place, time, and situation, CNII-XII grossly intact, no focal deficits  Psych: Normal mood and affect   Labs  High Sensitivity Troponin:   Recent Labs  Lab 07/20/22 1310 08/03/22 1655 08/03/22 1904 08/06/22 1649 08/06/22 1832  TROPONINIHS 13 9 9 13 16      Cardiac EnzymesNo results for input(s): "TROPONINI" in the last 168 hours. No results for input(s): "TROPIPOC" in the last 168 hours.  Chemistry Recent Labs  Lab 08/03/22 1655 08/04/22 0001 08/05/22 0108 08/06/22 1103  NA 127* 128* 133* 129*  K 3.7 3.5 4.5 4.1   CL 89* 92* 94* 93*  CO2 25 26 29 26   GLUCOSE 313* 298* 262* 260*  BUN 29* 29* 27* 30*  CREATININE 2.41* 2.41* 2.63* 2.60*  CALCIUM 7.9* 7.6* 7.9* 7.7*  PROT 7.4  --   --   --   ALBUMIN 2.1*  --   --   --   AST 51*  --   --   --   ALT 21  --   --   --   ALKPHOS 101  --   --   --   BILITOT 0.3  --   --   --   GFRNONAA 20* 20* 18* 18*  ANIONGAP 13 10 10 10     Hematology Recent Labs  Lab 08/03/22 1655 08/05/22 0108 08/06/22 1103  WBC 14.6* 12.7* 10.3  RBC 3.24* 3.00* 3.00*  HGB 9.3* 8.7* 8.6*  HCT 28.9* 27.2* 26.4*  MCV 89.2 90.7 88.0  MCH 28.7 29.0 28.7  MCHC 32.2 32.0 32.6  RDW 13.2 13.2 13.6  PLT 662* 577* 564*   BNP Recent Labs  Lab 08/03/22 1710  BNP 1,094.9*    DDimer No results for input(s): "DDIMER" in the last 168 hours.   Radiology  ECHOCARDIOGRAM COMPLETE  Result Date: 08/05/2022    ECHOCARDIOGRAM REPORT   Patient Name:   [name] [name] Date of Exam: 08/05/2022 Medical Rec #:  [id number]         Height:       69.0 in Accession #:    [id number]        Weight:       172.0 lb Date of Birth:  03-31-1940        BSA:          1.938 m Patient Age:    81 years          BP:           117/57 mmHg Patient Gender: F                 HR:           107 bpm. Exam Location:  Inpatient Procedure: 2D Echo, Color Doppler and Cardiac Doppler Indications:    acute systolic chf  History:        Patient has prior history of Echocardiogram examinations, most  recent 11/06/2020. Cardiomyopathy, CAD, chronic kidney disease,                 Arrythmias:Atrial Fibrillation; Risk Factors:Hypertension,                 Dyslipidemia and Diabetes.  Sonographer:    Delcie Roch RDCS Referring Phys: [phone] [name] [name]  Sonographer Comments: Image acquisition challenging due to patient body habitus. IMPRESSIONS  1. Left ventricular ejection fraction, by estimation, is 35 to 40%. The left ventricle has moderately decreased function. The left ventricle demonstrates global hypokinesis.  Left ventricular diastolic function could not be evaluated.  2. Right ventricular systolic function is normal. The right ventricular size is normal. There is normal pulmonary artery systolic pressure. The estimated right ventricular systolic pressure is 29.4 mmHg.  3. Left atrial size was moderately dilated.  4. There is a mobile echo bright denisty on the anterior mitral valve leaflet that likely represents calcification. If clinical concern for endocarditis, recommend TEE>. The mitral valve is degenerative. Mild mitral valve regurgitation. No evidence of mitral stenosis. Moderate mitral annular calcification.  5. The aortic valve is normal in structure. Aortic valve regurgitation is not visualized. No aortic stenosis is present.  6. The inferior vena cava is normal in size with greater than 50% respiratory variability, suggesting right atrial pressure of 3 mmHg. FINDINGS  Left Ventricle: Left ventricular ejection fraction, by estimation, is 35 to 40%. The left ventricle has moderately decreased function. The left ventricle demonstrates global hypokinesis. The left ventricular internal cavity size was normal in size. There is no left ventricular hypertrophy. Left ventricular diastolic function could not be evaluated. Right Ventricle: The right ventricular size is normal. No increase in right ventricular wall thickness. Right ventricular systolic function is normal. There is normal pulmonary artery systolic pressure. The tricuspid regurgitant velocity is 2.57 m/s, and  with an assumed right atrial pressure of 3 mmHg, the estimated right ventricular systolic pressure is 29.4 mmHg. Left Atrium: Left atrial size was moderately dilated. Right Atrium: Right atrial size was normal in size. Pericardium: There is no evidence of pericardial effusion. Mitral Valve: There is a mobile echo bright denisty on the anterior mitral valve leaflet that likely represents calcification. If clinical concern for endocarditis, recommend  TEE>. The mitral valve is degenerative in appearance. There is mild calcification of the mitral valve leaflet(s). Moderate mitral annular calcification. Mild mitral valve regurgitation. No evidence of mitral valve stenosis. Tricuspid Valve: The tricuspid valve is normal in structure. Tricuspid valve regurgitation is trivial. No evidence of tricuspid stenosis. Aortic Valve: The aortic valve is normal in structure. Aortic valve regurgitation is not visualized. No aortic stenosis is present. Pulmonic Valve: The pulmonic valve was normal in structure. Pulmonic valve regurgitation is not visualized. No evidence of pulmonic stenosis. Aorta: The aortic root is normal in size and structure. Venous: The inferior vena cava is normal in size with greater than 50% respiratory variability, suggesting right atrial pressure of 3 mmHg. IAS/Shunts: No atrial level shunt detected by color flow Doppler.  LEFT VENTRICLE PLAX 2D LVIDd:         4.60 cm LVIDs:         3.80 cm LV PW:         1.10 cm LV IVS:        1.00 cm LVOT diam:     1.90 cm LV SV:         37 LV SV Index:   19 LVOT Area:  2.84 cm  RIGHT VENTRICLE          IVC RV Basal diam:  2.80 cm  IVC diam: 1.80 cm LEFT ATRIUM              Index        RIGHT ATRIUM           Index LA diam:        4.20 cm  2.17 cm/m   RA Area:     13.50 cm LA Vol (A2C):   107.0 ml 55.22 ml/m  RA Volume:   29.60 ml  15.28 ml/m LA Vol (A4C):   72.2 ml  37.26 ml/m LA Biplane Vol: 88.0 ml  45.42 ml/m  AORTIC VALVE LVOT Vmax:   67.70 cm/s LVOT Vmean:  44.000 cm/s LVOT VTI:    0.131 m  AORTA Ao Root diam: 3.30 cm Ao Asc diam:  3.20 cm TRICUSPID VALVE TR Peak grad:   26.4 mmHg TR Vmax:        257.00 cm/s  SHUNTS Systemic VTI:  0.13 m Systemic Diam: 1.90 cm Armanda Magic MD Electronically signed by Armanda Magic MD Signature Date/Time: 08/05/2022/8:51:01 PM    Final    EP STUDY  Result Date: 08/05/2022 See surgical note for result.   Cardiac Studies  TTE 08/05/2022  1. Left ventricular ejection  fraction, by estimation, is 35 to 40%. The  left ventricle has moderately decreased function. The left ventricle  demonstrates global hypokinesis. Left ventricular diastolic function could  not be evaluated.   2. Right ventricular systolic function is normal. The right ventricular  size is normal. There is normal pulmonary artery systolic pressure. The  estimated right ventricular systolic pressure is 29.4 mmHg.   3. Left atrial size was moderately dilated.   4. There is a mobile echo bright denisty on the anterior mitral valve  leaflet that likely represents calcification. If clinical concern for  endocarditis, recommend TEE>. The mitral valve is degenerative. Mild  mitral valve regurgitation. No evidence of  mitral stenosis. Moderate mitral annular calcification.   5. The aortic valve is normal in structure. Aortic valve regurgitation is  not visualized. No aortic stenosis is present.   6. The inferior vena cava is normal in size with greater than 50%  respiratory variability, suggesting right atrial pressure of 3 mmHg.   Patient Profile  82 year old female with history of CKD stage IV, paroxysmal atrial fibrillation on Coumadin, CAD status post balloon angioplasty to OM1 in 2017, chronic systolic heart failure, UTI, diabetes, hypertension, HSV encephalitis (2014) who was admitted on 08/04/2022 with chest pain and atrial flutter.   Assessment & Plan   # Chest pain, suspect GI source -Chest pain yesterday while in sinus rhythm.  Troponins negative.  EKG unchanged. -Symptoms have improved with GI cocktail.  Started on Protonix.  Will continue this.  Does not appear to be cardiac.  # Paroxysmal atrial fibrillation/flutter -Status post cardioversion.  On Coumadin with INR goal 2-3.  Will continue amiodarone given reduction in ejection fraction.  400 mg twice daily for 7 days and then 200 mg daily thereafter. -Limited options given CKD stage IV. -Continue Coumadin with INR goal 2-3  #  Acute systolic heart failure, EF 35-40% -Suspect arrhythmia related.  Recommend rhythm control strategy with amiodarone.  Could be considered for ablation but given advanced age and CKD stage IV may not be a good candidate.  Can have ongoing discussions with his lung outpatient. -Euvolemic.  Can take Lasix 40  mg as needed home. -Continue metoprolol succinate 50 mg daily, Imdur 30 mg daily, hydralazine 25 mg 3 times daily.  Not a candidate for ACE/ARB/ARNI/MRA given CKD stage IV -cautious with SGLT2 inhibitor   # AKI -Appears to be resolving  # CAD status post POBA -Angioplasty in 2017.  Troponins negative.  EKG nonischemic.  Would continue home medications.  I do not believe her chest pain is cardiac in nature.  [facility] will sign off.   Medication Recommendations: As above Other recommendations (labs, testing, etc): None Follow up as an outpatient: Heart failure follow-up has been arranged on 08/25/2022  For questions or updates, please contact [facility] Please consult [email or web address] for contact info under        Signed, Gerri Spore T. Flora Lipps, MD, [facility]   [facility]  08/07/2022 8:19 AM

## 2022-08-07 NOTE — Telephone Encounter (Signed)
Plan of care signed (order number [id number]) signed and faxed to [facility] at [phone]. Form sent for scanning.

## 2022-08-08 ENCOUNTER — Inpatient Hospital Stay (HOSPITAL_COMMUNITY): Payer: Medicare Other

## 2022-08-08 DIAGNOSIS — I4891 Unspecified atrial fibrillation: Secondary | ICD-10-CM | POA: Diagnosis not present

## 2022-08-08 LAB — COMPREHENSIVE METABOLIC PANEL
ALT: 20 U/L (ref 0–44)
AST: 40 U/L (ref 15–41)
Albumin: 1.9 g/dL — ABNORMAL LOW (ref 3.5–5.0)
Alkaline Phosphatase: 94 U/L (ref 38–126)
Anion gap: 13 (ref 5–15)
BUN: 38 mg/dL — ABNORMAL HIGH (ref 8–23)
CO2: 25 mmol/L (ref 22–32)
Calcium: 7.7 mg/dL — ABNORMAL LOW (ref 8.9–10.3)
Chloride: 94 mmol/L — ABNORMAL LOW (ref 98–111)
Creatinine, Ser: 3.45 mg/dL — ABNORMAL HIGH (ref 0.44–1.00)
GFR, Estimated: 13 mL/min — ABNORMAL LOW (ref >60)
Glucose, Bld: 116 mg/dL — ABNORMAL HIGH (ref 70–99)
Potassium: 4.5 mmol/L (ref 3.5–5.1)
Sodium: 132 mmol/L — ABNORMAL LOW (ref 135–145)
Total Bilirubin: 0.7 mg/dL (ref 0.3–1.2)
Total Protein: 6.4 g/dL — ABNORMAL LOW (ref 6.5–8.1)

## 2022-08-08 LAB — GLUCOSE, CAPILLARY
Glucose-Capillary: 119 mg/dL — ABNORMAL HIGH (ref 70–99)
Glucose-Capillary: 165 mg/dL — ABNORMAL HIGH (ref 70–99)
Glucose-Capillary: 169 mg/dL — ABNORMAL HIGH (ref 70–99)
Glucose-Capillary: 133 mg/dL — ABNORMAL HIGH (ref 70–99)

## 2022-08-08 LAB — PROTIME-INR
INR: 4 — ABNORMAL HIGH (ref 0.8–1.2)
Prothrombin Time: 39.4 seconds — ABNORMAL HIGH (ref 11.4–15.2)

## 2022-08-08 MED ORDER — SODIUM CHLORIDE 0.9 % IV SOLN: INTRAVENOUS | Status: AC

## 2022-08-08 NOTE — Progress Notes (Signed)
PROGRESS NOTE    [name] [name]  WUJ:[id number] DOB: March 21, 1940 DOA: 08/03/2022 PCP: [name], MD   Brief Narrative:  [name] [name] is a 82 y.o. female with medical history significant of PAF on Coumadin, ischemic cardiomyopathy with LVEF 45-50%, chronic HFrEF, CKD stage IV, IDDM with insulin resistance, seizure disorder, blindness of left eye recent ESBL UTI, presented with rapid A-fib and worsening of LVEF.  Patient has been admitted with chest pain secondary to paroxysmal atrial flutter/fibrillation and has been started on IV amiodarone per cardiology.  She is also noted to have acute on chronic HFrEF as a result of this.  Underwent DCCV  6/05.  She also appears to have UTI with recent Klebsiella ESBL infection, received fosfomycin 6/05. -Patient develops chest pain 6/06, GI source, troponin negative,, resolved with Maalox.  -6/7 with 1 episode of vomiting. WBC has increase to 23 K, UA abnormal, started imipenem -6/8 with worsening AKI  Assessment and Plan:  Atrial flutter with RVR -With worsening of LVEF, cardiology started patient on amiodarone, recommended 400 Mg twice daily for 7 days followed by 200 Mg daily -INR= 4.0, on Coumadin per pharmacy -Continue metoprolol succinate -Underwent DCCV 6/05. Had episode of A fib overnight back in sinus.   Acute on chronic HFrEF decompensation -Was volume overloaded on admission, now euvolemic, diuretics on hold -Last echo 6/5 with EF 35-40%, normal RV, concern for mitral valve calcification -Seen by cards, cardiomyopathy suspected to be arrhythmia related, recommended rhythm control with amiodarone -GDMT limited by CKD 4, holding Imdur and hydralazine today for AKI with soft BPs  Mild AKI on CKD stage IV -Baseline creatinine around 2-2.4 -Creatinine has trended up to 3.4 today, diuretics held yesterday with uptrending creatinine -Meds reviewed, blood pressure soft hold hydralazine and Imdur, avoid hypotension -Also check renal  ultrasound  UTI with leukocytosis - Noted patient was treated for UTI recently completed Bactrim on May 25. -Appears to have signs of UTI -Prior urine culture with ESBL Klebsiella infection 5/20 -Received  fosfomycin 6/05 -Worsening leukocytosis 6/07, she was started on imipenem yesterday, follow-up urine culture, UA is abnormal   Vomiting;  -Resolved  Coumadin induced coagulopathy -No overt symptoms signs of bleeding, hold off Coumadin    IDDM with insulin resistance -Labile glucose control, hold off 70/30 -Stable, continue low-dose Semglee, Januvia   Hyponatremia-improving -Chronic, probably secondary to CHF, 1 dose of Lasix given,  Chronic anemia secondary to -H&H stable, no symptoms or signs of active bleeding.   Chest pain;  Report chest pain this afternoon. Report pain on and off for 3 years. They dont found anything.  She was tender on palpation.  Started  PPI, Maalox.  Troponin negative.    DVT prophylaxis:Warfarin Code Status: Discussed with patient she wishes to be Full code.  Family Communication: None at bedside Disposition Plan: Home likely 1 to 2 days   Consultants:  Cardiology  Procedures:  Anticipated DCCV today  Antimicrobials:  None   Subjective: -Feels much better, denies chest pain or dyspnea, no further nausea or vomiting  Objective: Vitals:   08/08/22 0052 08/08/22 0502 08/08/22 0551 08/08/22 0857  BP: (!) 102/43 (!) 116/51 (!) 116/51 (!) 121/52  Pulse:  73  73  Resp: 15 14  18   Temp:  97.7 F (36.5 C)  97.7 F (36.5 C)  TempSrc:  Oral  Oral  SpO2: 97% 95%  96%  Weight:  75.7 kg    Height:        Intake/Output Summary (  Last 24 hours) at 08/08/2022 1120 Last data filed at 08/08/2022 0817 Gross per 24 hour  Intake 870.16 ml  Output 600 ml  Net 270.16 ml   Filed Weights   08/06/22 0443 08/07/22 0419 08/08/22 0502  Weight: 78 kg 78.4 kg 75.7 kg    Examination:  Pleasant elderly female sitting up in bed, AAOx3, no  distress HEENT: No JVD CVS: S1-S2, regular rhythm Lungs: Clear bilaterally Abdomen: Soft, nontender, bowel sounds present Extremities: No edema    Data Reviewed: I have personally reviewed following labs and imaging studies  CBC: Recent Labs  Lab 08/03/22 1655 08/05/22 0108 08/06/22 1103 08/07/22 0124  WBC 14.6* 12.7* 10.3 23.7*  NEUTROABS 10.8*  --   --   --   HGB 9.3* 8.7* 8.6* 8.9*  HCT 28.9* 27.2* 26.4* 27.7*  MCV 89.2 90.7 88.0 89.9  PLT 662* 577* 564* 619*   Basic Metabolic Panel: Recent Labs  Lab 08/04/22 0001 08/04/22 0541 08/04/22 1518 08/05/22 0108 08/06/22 1103 08/07/22 0124 08/08/22 0933  NA 128*  --   --  133* 129* 130* 132*  K 3.5  --   --  4.5 4.1 4.4 4.5  CL 92*  --   --  94* 93* 95* 94*  CO2 26  --   --  29 26 22 25   GLUCOSE 298*  --   --  262* 260* 136* 116*  BUN 29*  --   --  27* 30* 33* 38*  CREATININE 2.41*  --   --  2.63* 2.60* 2.88* 3.45*  CALCIUM 7.6*  --   --  7.9* 7.7* 7.9* 7.7*  MG  --  1.9 2.1  --   --   --   --   PHOS  --   --  2.5  --   --   --   --    GFR: Estimated Creatinine Clearance: 13.6 mL/min (A) (by C-G formula based on SCr of 3.45 mg/dL (H)). Liver Function Tests: Recent Labs  Lab 08/03/22 1655 08/08/22 0933  AST 51* 40  ALT 21 20  ALKPHOS 101 94  BILITOT 0.3 0.7  PROT 7.4 6.4*  ALBUMIN 2.1* 1.9*   Recent Labs  Lab 08/03/22 1655  LIPASE 21   No results for input(s): "AMMONIA" in the last 168 hours. Coagulation Profile: Recent Labs  Lab 08/04/22 0541 08/05/22 0108 08/06/22 1103 08/07/22 0124 08/08/22 0123  INR 4.3* 4.2* 4.0* 4.0* 4.0*   Cardiac Enzymes: No results for input(s): "CKTOTAL", "CKMB", "CKMBINDEX", "TROPONINI" in the last 168 hours. BNP (last 3 results) No results for input(s): "PROBNP" in the last 8760 hours. HbA1C: No results for input(s): "HGBA1C" in the last 72 hours. CBG: Recent Labs  Lab 08/07/22 0558 08/07/22 1108 08/07/22 1619 08/07/22 2115 08/08/22 0628  GLUCAP 136* 127*  111* 179* 119*   Lipid Profile: No results for input(s): "CHOL", "HDL", "LDLCALC", "TRIG", "CHOLHDL", "LDLDIRECT" in the last 72 hours. Thyroid Function Tests: No results for input(s): "TSH", "T4TOTAL", "FREET4", "T3FREE", "THYROIDAB" in the last 72 hours. Anemia Panel: No results for input(s): "VITAMINB12", "FOLATE", "FERRITIN", "TIBC", "IRON", "RETICCTPCT" in the last 72 hours. Sepsis Labs: No results for input(s): "PROCALCITON", "LATICACIDVEN" in the last 168 hours.  No results found for this or any previous visit (from the past 240 hour(s)).       Radiology Studies: DG CHEST PORT 1 VIEW  Result Date: 08/07/2022 CLINICAL DATA:  Leukocytosis. EXAM: PORTABLE CHEST 1 VIEW COMPARISON:  08/03/2022 CT 07/20/2022 FINDINGS: Stable heart  size and mediastinal contours. Aortic atherosclerosis. Mild atelectasis in the lung bases without confluent airspace disease. No pulmonary edema, pleural effusion or pneumothorax. Remote bilateral rib fracture. IMPRESSION: Mild bibasilar atelectasis. Electronically Signed   By: Narda Rutherford M.D.   On: 08/07/2022 17:10        Scheduled Meds:  amiodarone  400 mg Oral BID   Followed by   Melene Muller ON 08/13/2022] amiodarone  200 mg Oral Daily   atorvastatin  80 mg Oral Daily   ezetimibe  10 mg Oral Daily   hydrALAZINE  25 mg Oral Q8H   insulin aspart  0-15 Units Subcutaneous TID WC   insulin aspart  0-5 Units Subcutaneous QHS   insulin glargine-yfgn  10 Units Subcutaneous Daily   isosorbide mononitrate  30 mg Oral Daily   linagliptin  5 mg Oral Daily   metoprolol succinate  50 mg Oral Daily   pantoprazole (PROTONIX) IV  40 mg Intravenous Q12H   sodium chloride flush  3 mL Intravenous Q12H   Warfarin - Pharmacist Dosing Inpatient   Does not apply q1600   Continuous Infusions:  sodium chloride 0 mL/hr at 08/03/22 1823   sodium chloride     meropenem (MERREM) IV 1 g (08/08/22 0916)     LOS: 4 days    Time spent: 35 minutes    Kiyonna Tortorelli,MD [facility]  If 7PM-7AM, please contact night-coverage [email or web address] 08/08/2022, 11:20 AM

## 2022-08-08 NOTE — Progress Notes (Signed)
ANTICOAGULATION CONSULT NOTE  Pharmacy Consult for warfarin  Indication: atrial fibrillation  Allergies  Allergen Reactions   Procaine Hcl Anaphylaxis   Amoxicillin Itching    Patient Measurements: Height: 5\' 9"  (175.3 cm) Weight: 75.7 kg (166 lb 14.2 oz) IBW/kg (Calculated) : 66.2  Vital Signs: Temp: 97.7 F (36.5 C) (06/08 0502) Temp Source: Oral (06/08 0502) BP: 116/51 (06/08 0551) Pulse Rate: 73 (06/08 0502)  Labs: Recent Labs    08/06/22 1103 08/06/22 1649 08/06/22 1832 08/07/22 0124 08/08/22 0123  HGB 8.6*  --   --  8.9*  --   HCT 26.4*  --   --  27.7*  --   PLT 564*  --   --  619*  --   LABPROT 39.2*  --   --  39.2* 39.4*  INR 4.0*  --   --  4.0* 4.0*  CREATININE 2.60*  --   --  2.88*  --   TROPONINIHS  --  13 16  --   --      Estimated Creatinine Clearance: 16.3 mL/min (A) (by C-G formula based on SCr of 2.88 mg/dL (H)).  Assessment: Patient admitted with CC of chest pain, recent admission for AKI. Was found to be in Afib with RVR in ED w fluid overload. She is on chronic warfarin for afib. INR on admission was elevated at 3.9.   PTA  Warfarin dose was 1.25 mg daily, last taken 08/03/22 @ 0800 prior to admission.   Warfarin  pharmacy consult was ordered on 08/03/22 and we held warfarin doses from 6/3-6/5 due to INR remaining supratherapeutic. Low dose was to be given on 6/6 but was charted as not given due to "medication unavailable." Patient received 0.5 mg yesterday and INR has remained unchanged and supratherapeutic at 4.0. INR trend since admission is 3.9>4.3>4.2>4.0>4.0>4.0. CBC has remained stable--no bleeding reported.   Currently on amiodarone since 08/04/22, which is likely to  increase warfarin hypoprothrombinemic effect. Will hold warfarin again today due to continued supratherapeutic INR.   I anticipate she will need lower warfarin dose than PTA dose, now that she is taking amiodarone. May only need warfarin dose every other day or every few days.    She will need close INR follow up after discharge until on stable warfarin regimen.   Goal of Therapy:  INR 2-3 Monitor platelets by anticoagulation protocol: Yes   Plan:  Hold warfarin today Daily INR F/u CBC in the AM   Thank you for allowing pharmacy to be part of this patients care team. Cherylin Mylar, PharmD PGY1 Pharmacy Resident 6/8/20247:49 AM

## 2022-08-08 NOTE — Progress Notes (Signed)
08/07/22 2315 assumed care of patient patient alert x4 on room air ale to make all needs known.

## 2022-08-09 DIAGNOSIS — I4891 Unspecified atrial fibrillation: Secondary | ICD-10-CM | POA: Diagnosis not present

## 2022-08-09 LAB — CBC
HCT: 24.4 % — ABNORMAL LOW (ref 36.0–46.0)
Hemoglobin: 7.9 g/dL — ABNORMAL LOW (ref 12.0–15.0)
MCH: 28.8 pg (ref 26.0–34.0)
MCHC: 32.4 g/dL (ref 30.0–36.0)
MCV: 89.1 fL (ref 80.0–100.0)
Platelets: 543 10*3/uL — ABNORMAL HIGH (ref 150–400)
RBC: 2.74 MIL/uL — ABNORMAL LOW (ref 3.87–5.11)
RDW: 13.9 % (ref 11.5–15.5)
WBC: 11.6 10*3/uL — ABNORMAL HIGH (ref 4.0–10.5)
nRBC: 0 % (ref 0.0–0.2)

## 2022-08-09 LAB — BASIC METABOLIC PANEL
Anion gap: 11 (ref 5–15)
BUN: 39 mg/dL — ABNORMAL HIGH (ref 8–23)
CO2: 24 mmol/L (ref 22–32)
Calcium: 7.9 mg/dL — ABNORMAL LOW (ref 8.9–10.3)
Chloride: 95 mmol/L — ABNORMAL LOW (ref 98–111)
Creatinine, Ser: 3.62 mg/dL — ABNORMAL HIGH (ref 0.44–1.00)
GFR, Estimated: 12 mL/min — ABNORMAL LOW (ref >60)
Glucose, Bld: 138 mg/dL — ABNORMAL HIGH (ref 70–99)
Potassium: 4.3 mmol/L (ref 3.5–5.1)
Sodium: 130 mmol/L — ABNORMAL LOW (ref 135–145)

## 2022-08-09 LAB — VITAMIN B12: Vitamin B-12: 1892 pg/mL — ABNORMAL HIGH (ref 180–914)

## 2022-08-09 LAB — GLUCOSE, CAPILLARY
Glucose-Capillary: 116 mg/dL — ABNORMAL HIGH (ref 70–99)
Glucose-Capillary: 204 mg/dL — ABNORMAL HIGH (ref 70–99)
Glucose-Capillary: 236 mg/dL — ABNORMAL HIGH (ref 70–99)
Glucose-Capillary: 124 mg/dL — ABNORMAL HIGH (ref 70–99)

## 2022-08-09 LAB — RETICULOCYTES
Immature Retic Fract: 22.1 % — ABNORMAL HIGH (ref 2.3–15.9)
RBC.: 3.13 MIL/uL — ABNORMAL LOW (ref 3.87–5.11)
Retic Count, Absolute: 76.4 10*3/uL (ref 19.0–186.0)
Retic Ct Pct: 2.4 % (ref 0.4–3.1)

## 2022-08-09 LAB — IRON AND TIBC
Iron: 33 ug/dL (ref 28–170)
Saturation Ratios: 16 % (ref 10.4–31.8)
TIBC: 213 ug/dL — ABNORMAL LOW (ref 250–450)
UIBC: 180 ug/dL

## 2022-08-09 LAB — PROTIME-INR
INR: 4.2 (ref 0.8–1.2)
Prothrombin Time: 40.6 seconds — ABNORMAL HIGH (ref 11.4–15.2)

## 2022-08-09 LAB — FOLATE: Folate: 19.6 ng/mL (ref >5.9)

## 2022-08-09 LAB — FERRITIN: Ferritin: 177 ng/mL (ref 11–307)

## 2022-08-09 MED ORDER — SODIUM CHLORIDE 0.9 % IV BOLUS
500.0000 mL | Freq: Once | INTRAVENOUS | Status: AC
  Administered 2022-08-09: 500 mL via INTRAVENOUS

## 2022-08-09 MED ORDER — SODIUM CHLORIDE 0.9 % IV SOLN
500.0000 mg | Freq: Two times a day (BID) | INTRAVENOUS | Status: AC
  Administered 2022-08-09 – 2022-08-11 (×6): 500 mg via INTRAVENOUS
  Filled 2022-08-09 (×6): qty 10

## 2022-08-09 MED ORDER — SODIUM CHLORIDE 0.9 % IV SOLN: INTRAVENOUS | Status: AC

## 2022-08-09 NOTE — Progress Notes (Signed)
ANTICOAGULATION CONSULT NOTE  Pharmacy Consult for warfarin  Indication: atrial fibrillation  Allergies  Allergen Reactions   Procaine Hcl Anaphylaxis   Amoxicillin Itching    Patient Measurements: Height: 5\' 9"  (175.3 cm) Weight: 74.3 kg (163 lb 11.2 oz) IBW/kg (Calculated) : 66.2  Vital Signs: Temp: 97.5 F (36.4 C) (06/09 0821) Temp Source: Oral (06/09 0821) BP: 140/57 (06/09 0821) Pulse Rate: 66 (06/09 0821)  Labs: Recent Labs    08/06/22 1103 08/06/22 1649 08/06/22 1832 08/07/22 0124 08/08/22 0123 08/08/22 0933 08/09/22 0104  HGB 8.6*  --   --  8.9*  --   --  7.9*  HCT 26.4*  --   --  27.7*  --   --  24.4*  PLT 564*  --   --  619*  --   --  543*  LABPROT 39.2*  --   --  39.2* 39.4*  --  40.6*  INR 4.0*  --   --  4.0* 4.0*  --  4.2*  CREATININE 2.60*  --   --  2.88*  --  3.45* 3.62*  TROPONINIHS  --  13 16  --   --   --   --      Estimated Creatinine Clearance: 13 mL/min (A) (by C-G formula based on SCr of 3.62 mg/dL (H)).  Assessment: Patient admitted with CC of chest pain, recent admission for AKI. Was found to be in Afib with RVR in ED w fluid overload. She is on chronic warfarin for afib. INR on admission was elevated at 3.9.   PTA  Warfarin dose was 1.25 mg daily, last taken 08/03/22 @ 0800 prior to admission.   Warfarin  pharmacy consult was ordered on 08/03/22 and we held warfarin doses from 6/3-6/5 due to INR remaining supratherapeutic. Low dose was to be given on 6/6 but was charted as not given due to "medication unavailable." Patient received 0.5 mg on 6/7 and INR remained unchanged and supratherapeutic at 4.0. Dose held 6/8 and INR remains supratherapeutic at 4.2. Hgb 8.9>>7.9 from yesterday--low, stable and no evidence of bleeding per nurse. Plts 543--stable.   Currently on amiodarone since 08/04/22, which is likely to  increase warfarin hypoprothrombinemic effect. Will hold warfarin again today due to continued supratherapeutic INR.   I anticipate  she will need lower warfarin dose than PTA dose, now that she is taking amiodarone. May only need warfarin dose every other day or every few days.   She will need close INR follow up after discharge until on stable warfarin regimen.   Goal of Therapy:  INR 2-3 Monitor platelets by anticoagulation protocol: Yes   Plan:  Hold warfarin today Daily INR F/u CBC in the AM   Thank you for allowing pharmacy to be part of this patients care team.  Cherylin Mylar, PharmD PGY1 Pharmacy Resident 6/9/20248:52 AM

## 2022-08-09 NOTE — Progress Notes (Signed)
PHARMACY NOTE:  ANTIMICROBIAL RENAL DOSAGE ADJUSTMENT  Current antimicrobial regimen includes a mismatch between antimicrobial dosage and estimated renal function.  As per policy approved by the Pharmacy & Therapeutics and Medical Executive Committees, the antimicrobial dosage will be adjusted accordingly.  Current antimicrobial dosage: meropenem 1g q12hrs  Indication: ESBL UTI  Renal Function:  Estimated Creatinine Clearance: 13 mL/min (A) (by C-G formula based on SCr of 3.62 mg/dL (H)). []      On intermittent HD, scheduled: []      On CRRT    Antimicrobial dosage has been changed to: meropenem 500mg  q12hrs  Additional comments:   Thank you for allowing pharmacy to be a part of this patient's care.  [employer], [facility] 08/09/2022 9:04 AM

## 2022-08-09 NOTE — Progress Notes (Signed)
PROGRESS NOTE    [name] [name]  ZOX:[id number] DOB: 06-05-1940 DOA: 08/03/2022 PCP: [name], MD   Brief Narrative:  [name] [name] is a 82 y.o. female with medical history significant of PAF on Coumadin, ischemic cardiomyopathy with LVEF 45-50%, chronic HFrEF, CKD stage IV, IDDM with insulin resistance, seizure disorder, blindness of left eye recent ESBL UTI, presented with rapid A-fib and worsening of LVEF.  Patient has been admitted with chest pain secondary to paroxysmal atrial flutter/fibrillation and has been started on IV amiodarone per cardiology.  She is also noted to have acute on chronic HFrEF as a result of this.  Underwent DCCV  6/05.  She also appears to have UTI with recent Klebsiella ESBL infection, received fosfomycin 6/05. -Patient develops chest pain 6/06, GI source, troponin negative,, resolved with Maalox.  -6/7 with 1 episode of vomiting. WBC has increase to 23 K, UA abnormal, started imipenem -6/8 with worsening AKI -6/9: Nephrology consult  Assessment and Plan:  Atrial flutter with RVR -With worsening of LVEF, cardiology started patient on amiodarone, recommended 400 Mg twice daily for 7 days followed by 200 Mg daily -Underwent DCCV 6/5, subsequently had an episode of A-fib but converted back to sinus rhythm -INR therapeutic, continue Coumadin, metoprolol  Acute on chronic HFrEF decompensation -Was volume overloaded on admission, now euvolemic, diuretics on hold -Last echo 6/5 with EF 35-40%, normal RV, concern for mitral valve calcification -Seen by cards, cardiomyopathy suspected to be arrhythmia related, recommended rhythm control with amiodarone -GDMT limited by CKD 4, held Imdur and hydralazine yesterday for AKI with soft BPs  AKI on CKD stage IV -Baseline creatinine around 2-2.4 -Creatinine has trended up to 3.6 today, diuretics have been on hold for few days -Was also treated with Bactrim recently prior to admission for UTI -Suspect  hemodynamically mediated, BP was soft early yesterday, hydralazine and Imdur held, renal ultrasound without overt hydronephrosis -Will request nephrology input  UTI with leukocytosis - Noted patient was treated for UTI recently completed Bactrim on May 25. -Appears to have signs of UTI -Prior urine culture with ESBL Klebsiella infection 5/20 -Received  fosfomycin 6/05 -Worsening leukocytosis 6/07, she was started on imipenem yesterday, follow-up urine culture, UA is abnormal   Vomiting;  -Resolved   IDDM with insulin resistance -Labile glucose control, hold off 70/30 -Stable, continue low-dose Semglee, Januvia   Hyponatremia-improving -Chronic, probably secondary to CHF, 1 dose of Lasix given,  Chronic anemia -Mild downtrend in hemoglobin, check anemia panel   Chest pain;  -Chronic resolved, no further workup planned per cards   DVT prophylaxis:Warfarin Code Status: Full code Family Communication: None at bedside Disposition Plan: Home likely 1 to 2 days   Consultants:  Cardiology  Procedures:  DCCV  Antimicrobials:  None   Subjective: -Feels well, denies any complaints  Objective: Vitals:   08/08/22 2013 08/09/22 0500 08/09/22 0609 08/09/22 0821  BP: (!) 111/54  (!) 122/42 (!) 140/57  Pulse: 68  62 66  Resp: 18  15 19   Temp: (!) 97.5 F (36.4 C)  98.5 F (36.9 C) (!) 97.5 F (36.4 C)  TempSrc: Oral  Oral Oral  SpO2: 96%  96% 95%  Weight:  74.3 kg    Height:        Intake/Output Summary (Last 24 hours) at 08/09/2022 1104 Last data filed at 08/09/2022 0949 Gross per 24 hour  Intake 235.69 ml  Output 900 ml  Net -664.31 ml   Filed Weights   08/07/22 0419 08/08/22  0502 08/09/22 0500  Weight: 78.4 kg 75.7 kg 74.3 kg    Examination:  Pleasant elderly female sitting up in bed, AAOx3, no distress HEENT: No JVD CVS: S1-S2, regular rhythm Lungs: Clear bilaterally Abdomen: Soft, nontender, bowel sounds present  Extremities: No edema    Data  Reviewed: I have personally reviewed following labs and imaging studies  CBC: Recent Labs  Lab 08/03/22 1655 08/05/22 0108 08/06/22 1103 08/07/22 0124 08/09/22 0104  WBC 14.6* 12.7* 10.3 23.7* 11.6*  NEUTROABS 10.8*  --   --   --   --   HGB 9.3* 8.7* 8.6* 8.9* 7.9*  HCT 28.9* 27.2* 26.4* 27.7* 24.4*  MCV 89.2 90.7 88.0 89.9 89.1  PLT 662* 577* 564* 619* 543*   Basic Metabolic Panel: Recent Labs  Lab 08/04/22 0541 08/04/22 1518 08/05/22 0108 08/06/22 1103 08/07/22 0124 08/08/22 0933 08/09/22 0104  NA  --   --  133* 129* 130* 132* 130*  K  --   --  4.5 4.1 4.4 4.5 4.3  CL  --   --  94* 93* 95* 94* 95*  CO2  --   --  29 26 22 25 24   GLUCOSE  --   --  262* 260* 136* 116* 138*  BUN  --   --  27* 30* 33* 38* 39*  CREATININE  --   --  2.63* 2.60* 2.88* 3.45* 3.62*  CALCIUM  --   --  7.9* 7.7* 7.9* 7.7* 7.9*  MG 1.9 2.1  --   --   --   --   --   PHOS  --  2.5  --   --   --   --   --    GFR: Estimated Creatinine Clearance: 13 mL/min (A) (by C-G formula based on SCr of 3.62 mg/dL (H)). Liver Function Tests: Recent Labs  Lab 08/03/22 1655 08/08/22 0933  AST 51* 40  ALT 21 20  ALKPHOS 101 94  BILITOT 0.3 0.7  PROT 7.4 6.4*  ALBUMIN 2.1* 1.9*   Recent Labs  Lab 08/03/22 1655  LIPASE 21   No results for input(s): "AMMONIA" in the last 168 hours. Coagulation Profile: Recent Labs  Lab 08/05/22 0108 08/06/22 1103 08/07/22 0124 08/08/22 0123 08/09/22 0104  INR 4.2* 4.0* 4.0* 4.0* 4.2*   Cardiac Enzymes: No results for input(s): "CKTOTAL", "CKMB", "CKMBINDEX", "TROPONINI" in the last 168 hours. BNP (last 3 results) No results for input(s): "PROBNP" in the last 8760 hours. HbA1C: No results for input(s): "HGBA1C" in the last 72 hours. CBG: Recent Labs  Lab 08/08/22 1128 08/08/22 1600 08/08/22 2057 08/09/22 0616 08/09/22 1056  GLUCAP 165* 169* 133* 116* 204*   Lipid Profile: No results for input(s): "CHOL", "HDL", "LDLCALC", "TRIG", "CHOLHDL",  "LDLDIRECT" in the last 72 hours. Thyroid Function Tests: No results for input(s): "TSH", "T4TOTAL", "FREET4", "T3FREE", "THYROIDAB" in the last 72 hours. Anemia Panel: No results for input(s): "VITAMINB12", "FOLATE", "FERRITIN", "TIBC", "IRON", "RETICCTPCT" in the last 72 hours. Sepsis Labs: No results for input(s): "PROCALCITON", "LATICACIDVEN" in the last 168 hours.  No results found for this or any previous visit (from the past 240 hour(s)).       Radiology Studies: US RENAL  Result Date: 08/08/2022 CLINICAL DATA:  Acute kidney injury EXAM: RENAL / URINARY TRACT ULTRASOUND COMPLETE COMPARISON:  CT 07/20/2018 and older FINDINGS: Right Kidney: Renal measurements: 7.0 x 3.6 x 4.1 cm = volume: 64 mL. Small echogenic right kidney. No collecting system dilatation or perinephric fluid. There  is an anechoic structure along the right kidney measuring up to 2.6 cm which has smooth margins and through transmission consistent with a benign cysts. No specific follow-up. Left Kidney: Renal measurements: 10.2 x 4.3 x 4.2 cm = volume: 109 mL. Left kidney is partially obscured by overlapping bowel gas and soft tissue. There is mild to moderate collecting system dilatation which is similar to the prior CT scan. There is also a 2.9 Cm cyst along the left kidney which has benign features. Bladder: Contracted Other: None. IMPRESSION: Renal atrophy. Persistent left-sided collecting system dilatation similar to prior CT. Bilateral benign renal cysts.  No specific follow-up of the cysts. Contracted urinary bladder. Electronically Signed   By: Karen Kays M.D.   On: 08/08/2022 15:43   DG CHEST PORT 1 VIEW  Result Date: 08/07/2022 CLINICAL DATA:  Leukocytosis. EXAM: PORTABLE CHEST 1 VIEW COMPARISON:  08/03/2022 CT 07/20/2022 FINDINGS: Stable heart size and mediastinal contours. Aortic atherosclerosis. Mild atelectasis in the lung bases without confluent airspace disease. No pulmonary edema, pleural effusion or  pneumothorax. Remote bilateral rib fracture. IMPRESSION: Mild bibasilar atelectasis. Electronically Signed   By: Narda Rutherford M.D.   On: 08/07/2022 17:10        Scheduled Meds:  amiodarone  400 mg Oral BID   Followed by   Melene Muller ON 08/13/2022] amiodarone  200 mg Oral Daily   atorvastatin  80 mg Oral Daily   ezetimibe  10 mg Oral Daily   insulin aspart  0-15 Units Subcutaneous TID WC   insulin aspart  0-5 Units Subcutaneous QHS   insulin glargine-yfgn  10 Units Subcutaneous Daily   linagliptin  5 mg Oral Daily   metoprolol succinate  50 mg Oral Daily   pantoprazole (PROTONIX) IV  40 mg Intravenous Q12H   sodium chloride flush  3 mL Intravenous Q12H   Warfarin - Pharmacist Dosing Inpatient   Does not apply q1600   Continuous Infusions:  sodium chloride 0 mL/hr at 08/03/22 1823   sodium chloride     sodium chloride     meropenem (MERREM) IV 500 mg (08/09/22 0947)     LOS: 5 days    Time spent: 35 minutes    Faylinn Schwenn,MD [facility]  If 7PM-7AM, please contact night-coverage [email or web address] 08/09/2022, 11:04 AM

## 2022-08-09 NOTE — Progress Notes (Signed)
2200 dose of PO amioderone held for HR < 60 per Dr. Welton Flakes on call cardiologist.

## 2022-08-09 NOTE — Consult Note (Addendum)
Renal Service Consult Note [facility]  [name] [name] 08/09/2022 [name] Krabbe, MD Requesting Physician: Dr. Jomarie Longs  Reason for Consult: Renal failure HPI: The patient is a 82 y.o. year-old w/ PMH as below who presented to ED on 6/03, sent from MD's office due concerns over pt being pale and nauseous and unable to do EKG. Pt in ED was reporting L sided chest pains, SOB and nausea. She was sent to [facility] ER and found to have afib w/ RVR w/ heart rates in the 160s, pt also appeared vol overloaded. She was started on IV amio drip and HR dropped to 90s. IV lasix was given. ECHO in ED showed EF approx 30%. BNP 1094, trop 9. Na 127. Pt was transferred to [facility] for admission. At [facility] po metoprolol was continued and pt underwent DCCV on 6/05, then went back into NSR. ECHO on 6/5 showed EF 35-40%. IV amio was transitioned to oral. She got the 1 dose of IV lasix but none further were given as the pt looked euvolemic. She was suspected to have a UTI w/ leukocytosis. Hx of prior ESBL klebsiella infection in May. Imipenem was started IV. Creat was 2.4 on admission, them bumped up to 2.8, then up to 3.4 yesterday and 3.6 today. We are asked to see for renal failure.   Pt seen in room. Pt is in good spirits, feels much better than when admitted. No SOB, CP or abd pain, no leg swelling.   ROS - denies CP, no joint pain, no HA, no blurry vision, no rash, no diarrhea, no nausea/ vomiting, no dysuria, no difficulty voiding   Past Medical History  Past Medical History:  Diagnosis Date   Atrial fibrillation (HCC)    SVT dx 2007, cath 2007 mild CAD, then had an cardioversion, ablation; still on coumadin , has occ palpitation, EKG 03-2010 NSR   Blindness of left eye    CKD (chronic kidney disease), stage III (HCC) 06/16/2016   [name] [name] 06/17/2016   DIABETES MELLITUS, TYPE II 03/27/2006   dr Everardo All   Eye muscle weakness    Right eye weakness after cataract surgery   GERD (gastroesophageal  reflux disease) 10/05/2011   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   HYPERLIPIDEMIA 03/27/2006   HYPERTENSION 03/27/2006   Intertrochanteric fracture of right hip (HCC) 07/13/2012   LUNG NODULE 09/01/2006   Excision, Bx Benighn   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"   Osteopenia 2004   Dexa 2004 showed Osteopenia, DEXA 03/2007 normal   Osteopenia    Other chronic cystitis with hematuria    Pelvic fracture (HCC) 06/16/2016   S/P fall   Recurrent urinary tract infection    Seeing Urology   RETINOPATHY, BACKGROUND NOS 03/27/2006   Seizures (HCC)    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   Past Surgical History  Past Surgical History:  Procedure Laterality Date   ANKLE FRACTURE SURGERY Bilateral 08/2015   "right one was in 2 places"   CARDIAC CATHETERIZATION N/A 05/17/2015   Procedure: Left Heart Cath and Coronary Angiography;  Surgeon: Corky Crafts, MD;  Location: [facility] INVASIVE CV LAB;  Service: Cardiovascular;  Laterality: N/A;   CARDIAC CATHETERIZATION N/A 05/17/2015   Procedure: Coronary Balloon Angioplasty;  Surgeon: Corky Crafts, MD;  Location: [facility] INVASIVE CV LAB;  Service: Cardiovascular;  Laterality: N/A;   CARDIOVERSION N/A 08/05/2022   Procedure: CARDIOVERSION;  Surgeon: Chilton Si, MD;  Location: [facility] INVASIVE CV LAB;  Service: Cardiovascular;  Laterality:  N/A;   FEMUR IM NAIL Right 07/15/2012   Procedure: INTRAMEDULLARY (IM) NAIL HIP;  Surgeon: Eulas Post, MD;  Location: [facility] OR;  Service: Orthopedics;  Laterality: Right;   FEMUR IM NAIL Left 12/02/2015   Procedure: INTRAMEDULLARY (IM) NAIL FEMORAL;  Surgeon: Eldred Manges, MD;  Location: [facility] OR;  Service: Orthopedics;  Laterality: Left;   FRACTURE SURGERY     INCISION AND DRAINAGE HIP Left 01/03/2016   Procedure: IRRIGATION AND DEBRIDEMENT HIP;  Surgeon: Eldred Manges, MD;  Location: [facility] OR;  Service: Orthopedics;  Laterality: Left;   SHOULDER SURGERY Bilateral    "don't remember which side"   Family History  Family History   Problem Relation Age of Onset   Diabetes Father    Heart attack Father 57   Diabetes Sister    Drug abuse Son    Cancer Neg Hx        no hx of colon or breast cancer   Social History  reports that she quit smoking about 44 years ago. Her smoking use included cigarettes. She has never used smokeless tobacco. She reports that she does not drink alcohol and does not use drugs. Allergies  Allergies  Allergen Reactions   Procaine Hcl Anaphylaxis   Amoxicillin Itching   Home medications Prior to Admission medications   Medication Sig Start Date End Date Taking? Authorizing Provider  atorvastatin (LIPITOR) 80 MG tablet Take 1 tablet by mouth once daily 10/01/21  Yes [name], Nolon Rod, MD  ezetimibe (ZETIA) 10 MG tablet Take 1 tablet (10 mg total) by mouth daily. 12/04/21  Yes Chilton Si, MD  furosemide (LASIX) 40 MG tablet Take 1 tablet (40 mg total) by mouth daily as needed for fluid or edema. 07/23/22  Yes Dahal, Melina Schools, MD  insulin NPH-regular Human (NOVOLIN 70/30 RELION) (70-30) 100 UNIT/ML injection 10 units in the morning and 5 units at night. Patient taking differently: Inject 5-23 Units into the skin daily. 07/23/22  Yes Dahal, Melina Schools, MD  metoprolol succinate (TOPROL-XL) 50 MG 24 hr tablet TAKE 1 TABLET BY MOUTH ONCE DAILY . APPOINTMENT REQUIRED FOR FUTURE REFILLS Patient taking differently: Take 50 mg by mouth daily. 06/15/22  Yes Chilton Si, MD  Multiple Vitamins-Minerals (MULTIVITAMIN ADULT PO) Take 1 tablet by mouth daily with breakfast.    Yes [provider]  VITAMIN D, CHOLECALCIFEROL, PO Take 1 tablet by mouth daily.   Yes [provider]  warfarin (COUMADIN) 2.5 MG tablet Take 0.5 tablets (1.25 mg total) by mouth daily. 07/10/22   Dorcas Carrow, MD     Vitals:   08/09/22 0821 08/09/22 1100 08/09/22 1239 08/09/22 1631  BP: (!) 140/57 125/66 (!) 126/56 (!) 123/57  Pulse: 66 64 (!) 59 (!) 55  Resp: 19 18 18 14   Temp: (!) 97.5 F (36.4 C) 97.8 F  (36.6 C) (!) 97.4 F (36.3 C) (!) 97.4 F (36.3 C)  TempSrc: Oral  Oral Oral  SpO2: 95%  98% 98%  Weight:      Height:       Exam Gen alert, no distress No rash, cyanosis or gangrene Sclera anicteric, throat clear  No jvd or bruits Chest clear bilat to bases, no rales/ wheezing RRR no MRG Abd soft ntnd no mass or ascites +bs GU defer MS no joint effusions or deformity Ext no LE or UE edema, no wounds or ulcers Neuro is alert, Ox 3 , nf       Home meds include - lipitor, zetia, lasix  40mg  prn daily, insulin 70/30 bid, toprol xl 50 qd, warfarin as directed, prns/ vits/ supps     Date   Creat  eGFR   2008- 2012  0.70- 1.20   2013   0.81- 1.79   2014- 2015  0.83- 1.10   2016   1.08-1.15   2017   1.13- 2.06   2018   1.39- 2.33   2019- 2020  1.48- 2.11   2021   1.51- 2.36   2022   1.57- 1.99   2023   1.31- 2.18   3/05- 06/30/22  1.68- 1.88 25- 31 ml/min    5/07- 07/10/22  1.83 >> 1.41 28- 38 ml/min   5/20- 07/23/22  2.90 >> 1.96 AKI   6/03   2.41  20 ml/min   6/04   2.41    6/05   2.63   6/06   2.60   6/07   2.88   6/08   3.45   08/09/22  3.62  12 ml/min    B/l creatinine 1.41- 1.68 from early 2024, eGFR 31- 38 ml/min    UA 6/7 - turbid, mod LE, 100 prot, many bact, >50 wbc, 11-20 rbc, 6-10 epis    Labs today --> Na 130 K 4.3  CO2 24  BUN 39  creat 3.62    BP's 110-130/ 55- 75 since admission, occasional drops into the 90's and 100's   HR currently 50s- 70s,  RR 14- 18, afeb   RA 96- 100%     Renal US --> R kidney 7.0 cm, L kidney 10.2 cm. L kidney has mild to moderate collecting system dilatation which is similar to the prior CT scan.      CXR 6.07 - no active disease, mild basilar atx  Assessment/ Plan: AKI on CKD 3b - b/l creatinine 1.41- 1.68 from early 2024, eGFR 31- 38 ml/min. Creat was 2.4 on admission in the setting of afib / RVR w/ chest pain and SOB. Got IV lasix x 1. Had some BP drops into the 90s and UOP dropped off on 6/5 and 6/6. After 6/05 DCCV, UOP  improved but still had BP drops into the 90s and ^'ing creatinine. IVF's started yesterday at 50 cc/hr. On exam pt is euvolemic. Will ^ IVF's to 85 cc/hr and give 500 cc bolus. Get urine lytes. Hopefully vol expansion will help avoid further hypotension and improve renal perfusion and function. Meds reviewed. Will follow. Hypotension - try to keep SBP > 100 AFib/ RVR - rx'd w/ IV amio then DCCV and is now on po amio. Maintaining NSR.  Acute on chronic HFrEF - w/ LVEF 35- 40% by echo here, probably arrhythmia related per cardiology.  Hypotension - as above, holding imdur and hydralazine due to low BP's.  IDDM    Vinson Moselle  MD [facility] 08/09/2022, 5:56 PM  Recent Labs  Lab 08/03/22 1655 08/04/22 0001 08/04/22 1518 08/05/22 0108 08/07/22 0124 08/08/22 0933 08/09/22 0104  HGB 9.3*  --   --    < > 8.9*  --  7.9*  ALBUMIN 2.1*  --   --   --   --  1.9*  --   CALCIUM 7.9*   < >  --    < > 7.9* 7.7* 7.9*  PHOS  --   --  2.5  --   --   --   --   CREATININE 2.41*   < >  --    < > 2.88* 3.45* 3.62*  K 3.7   < >  --    < > 4.4 4.5 4.3   < > = values in this interval not displayed.   Inpatient medications:  amiodarone  400 mg Oral BID   Followed by   Melene Muller ON 08/13/2022] amiodarone  200 mg Oral Daily   atorvastatin  80 mg Oral Daily   ezetimibe  10 mg Oral Daily   insulin aspart  0-15 Units Subcutaneous TID WC   insulin aspart  0-5 Units Subcutaneous QHS   insulin glargine-yfgn  10 Units Subcutaneous Daily   linagliptin  5 mg Oral Daily   metoprolol succinate  50 mg Oral Daily   pantoprazole (PROTONIX) IV  40 mg Intravenous Q12H   sodium chloride flush  3 mL Intravenous Q12H   Warfarin - Pharmacist Dosing Inpatient   Does not apply q1600    sodium chloride 0 mL/hr at 08/03/22 1823   sodium chloride     sodium chloride 50 mL/hr at 08/09/22 1125   meropenem (MERREM) IV 500 mg (08/09/22 0947)   sodium chloride, sodium chloride, acetaminophen, nitroGLYCERIN, ondansetron (ZOFRAN) IV, sodium  chloride flush

## 2022-08-10 ENCOUNTER — Other Ambulatory Visit (HOSPITAL_COMMUNITY): Payer: Self-pay

## 2022-08-10 DIAGNOSIS — I4891 Unspecified atrial fibrillation: Secondary | ICD-10-CM | POA: Diagnosis not present

## 2022-08-10 LAB — CBC
HCT: 25.2 % — ABNORMAL LOW (ref 36.0–46.0)
Hemoglobin: 8 g/dL — ABNORMAL LOW (ref 12.0–15.0)
MCH: 28.4 pg (ref 26.0–34.0)
MCHC: 31.7 g/dL (ref 30.0–36.0)
MCV: 89.4 fL (ref 80.0–100.0)
Platelets: 535 10*3/uL — ABNORMAL HIGH (ref 150–400)
RBC: 2.82 MIL/uL — ABNORMAL LOW (ref 3.87–5.11)
RDW: 13.6 % (ref 11.5–15.5)
WBC: 9.1 10*3/uL (ref 4.0–10.5)
nRBC: 0 % (ref 0.0–0.2)

## 2022-08-10 LAB — GLUCOSE, CAPILLARY
Glucose-Capillary: 80 mg/dL (ref 70–99)
Glucose-Capillary: 126 mg/dL — ABNORMAL HIGH (ref 70–99)
Glucose-Capillary: 167 mg/dL — ABNORMAL HIGH (ref 70–99)

## 2022-08-10 LAB — BASIC METABOLIC PANEL
Anion gap: 8 (ref 5–15)
BUN: 37 mg/dL — ABNORMAL HIGH (ref 8–23)
CO2: 23 mmol/L (ref 22–32)
Calcium: 7.4 mg/dL — ABNORMAL LOW (ref 8.9–10.3)
Chloride: 98 mmol/L (ref 98–111)
Creatinine, Ser: 3.56 mg/dL — ABNORMAL HIGH (ref 0.44–1.00)
GFR, Estimated: 12 mL/min — ABNORMAL LOW (ref >60)
Glucose, Bld: 116 mg/dL — ABNORMAL HIGH (ref 70–99)
Potassium: 4.3 mmol/L (ref 3.5–5.1)
Sodium: 129 mmol/L — ABNORMAL LOW (ref 135–145)

## 2022-08-10 LAB — PROTIME-INR
INR: 4.2 (ref 0.8–1.2)
Prothrombin Time: 40.6 seconds — ABNORMAL HIGH (ref 11.4–15.2)

## 2022-08-10 LAB — CREATININE, URINE, RANDOM: Creatinine, Urine: 51 mg/dL

## 2022-08-10 LAB — SODIUM, URINE, RANDOM: Sodium, Ur: 21 mmol/L

## 2022-08-10 MED ORDER — SODIUM CHLORIDE 0.9 % IV SOLN
250.0000 mg | Freq: Every day | INTRAVENOUS | Status: AC
  Administered 2022-08-10 – 2022-08-11 (×2): 250 mg via INTRAVENOUS
  Filled 2022-08-10 (×2): qty 20

## 2022-08-10 MED ORDER — PANTOPRAZOLE SODIUM 40 MG PO TBEC
40.0000 mg | DELAYED_RELEASE_TABLET | Freq: Two times a day (BID) | ORAL | Status: DC
  Administered 2022-08-10 – 2022-08-12 (×4): 40 mg via ORAL
  Filled 2022-08-10 (×4): qty 1

## 2022-08-10 NOTE — Progress Notes (Signed)
Pharmacy Antibiotic Note  [name] [name] is a 82 y.o. female admitted on 08/03/2022 with Atrial flutter with RVR.  Pharmacy has been consulted on 08/07/2022  for Meropenem dosing for ESBL infection due to worsening leukocytosis.    WBC have trended down to 9.1. Afebrile, Tmax 98.4 AKI on CKD stage IV: SCr 3.56 (CrCl ~27ml/min) No cultures this admission  Plan: Continue Meropenem 500mg  IV every 12 hours Monitor daily CBC, temp, SCr, and for clinical signs of improvement   Height: 5\' 9"  (175.3 cm) Weight: 78.5 kg (173 lb 1 oz) IBW/kg (Calculated) : 66.2  Temp (24hrs), Avg:97.6 F (36.4 C), Min:97.4 F (36.3 C), Max:98.4 F (36.9 C)  Recent Labs  Lab 08/05/22 0108 08/06/22 1103 08/07/22 0124 08/08/22 0933 08/09/22 0104 08/10/22 0105  WBC 12.7* 10.3 23.7*  --  11.6* 9.1  CREATININE 2.63* 2.60* 2.88* 3.45* 3.62* 3.56*     Estimated Creatinine Clearance: 13.2 mL/min (A) (by C-G formula based on SCr of 3.56 mg/dL (H)).    Allergies  Allergen Reactions   Procaine Hcl Anaphylaxis   Amoxicillin Itching    Antimicrobials this admission:   Meropenem:  6/7>>  Dose adjustments this admission:  6/9 - meropenem dose decreased for worsening renal function from 1g q12h to 500mg  q12h   Microbiology results: None   Thank you for allowing pharmacy to be a part of this patient's care.  Wilburn Cornelia, PharmD, BCPS Clinical Pharmacist 08/10/2022 7:32 AM   Please refer to AMION for pharmacy phone number

## 2022-08-10 NOTE — TOC Progression Note (Addendum)
Transition of Care [facility]) - Progression Note    Patient Details  Name: [name] [name] MRN: [id number] Date of Birth: 09-06-1940  Transition of Care [facility]) CM/SW Contact  Leone Haven, RN Phone Number: 08/10/2022, 2:19 PM  Clinical Narrative:    Per Trey Paula Pharmacist, he checked medication coverage and she has been paying cash for her medications at [facility].   BCBS supplement does not cover her prescriptions.   Expected Discharge Plan: Home w Home Health Services Barriers to Discharge: Continued Medical Work up  Expected Discharge Plan and Services In-house Referral: NA Discharge Planning Services: CM Consult Post Acute Care Choice: Home Health Living arrangements for the past 2 months: Single Family Home                 DME Arranged: N/A DME Agency: NA       HH Arranged: PT HH AgencyHotel manager Home Health Care Date HH Agency Contacted: 08/06/22 Time HH Agency Contacted: 1534 Representative spoke with at [facility] Agency: Kandee Keen   Social Determinants of Health (SDOH) Interventions SDOH Screenings   Food Insecurity: No Food Insecurity (08/04/2022)  Housing: Low Risk  (08/04/2022)  Transportation Needs: No Transportation Needs (08/04/2022)  Utilities: Not At Risk (08/04/2022)  Alcohol Screen: Low Risk  (08/22/2020)  Depression (PHQ2-9): Low Risk  (05/05/2022)  Financial Resource Strain: Low Risk  (08/22/2020)  Physical Activity: Inactive (08/22/2020)  Social Connections: Moderately Integrated (08/22/2020)  Stress: No Stress Concern Present (08/22/2020)  Tobacco Use: Medium Risk (08/06/2022)    Readmission Risk Interventions    08/06/2022    3:31 PM 07/23/2022   12:43 PM  Readmission Risk Prevention Plan  Transportation Screening Complete Complete  PCP or Specialist Appt within 5-7 Days  Complete  PCP or Specialist Appt within 3-5 Days Complete   Home Care Screening  Complete  Medication Review (RN CM)  Referral to Pharmacy  HRI or Home Care Consult Complete   Palliative Care  Screening Not Applicable   Medication Review (RN Care Manager) Complete

## 2022-08-10 NOTE — Progress Notes (Addendum)
ANTICOAGULATION CONSULT NOTE  Pharmacy Consult for warfarin  Indication: atrial fibrillation  Allergies  Allergen Reactions   Procaine Hcl Anaphylaxis   Amoxicillin Itching    Patient Measurements: Height: 5\' 9"  (175.3 cm) Weight: 78.5 kg (173 lb 1 oz) IBW/kg (Calculated) : 66.2  Vital Signs: Temp: 98.4 F (36.9 C) (06/10 0451) Temp Source: Oral (06/10 0451) BP: 128/48 (06/10 0451) Pulse Rate: 60 (06/10 0451)  Labs: Recent Labs    08/08/22 0123 08/08/22 0933 08/09/22 0104 08/10/22 0105  HGB  --   --  7.9* 8.0*  HCT  --   --  24.4* 25.2*  PLT  --   --  543* 535*  LABPROT 39.4*  --  40.6* 40.6*  INR 4.0*  --  4.2* 4.2*  CREATININE  --  3.45* 3.62* 3.56*     Estimated Creatinine Clearance: 13.2 mL/min (A) (by C-G formula based on SCr of 3.56 mg/dL (H)).  Assessment: Patient admitted with CC of chest pain, recent admission for AKI. Was found to be in Afib with RVR in ED w fluid overload. She is on chronic warfarin for afib. INR on admission was elevated at 3.9.   PTA  Warfarin dose was 1.25 mg daily, last taken 08/03/22 @ 0800 prior to admission.   Warfarin  pharmacy consult was ordered on 08/03/22 and warfarin was held from 6/3-6/5 due to supratherapeutic INR. Patient was initiated on amiodarone IV load on 6/4 and transitioned to PO on 6/6. Warfarin 0.5mg  last given on 6/7 with INR of 4.0.   Amiodarone increases the hypoprothrombinemic effects of warfarin through inhibition of CYP2C9- and CYP3A4-mediated metabolism of warfarin.  INR 4.2, has remained supratherapeutic despite no further doses of warfarin since 6/7.  Hgb 8, Plt 535 - remain stable No s/sx of bleeding reported by RN  Goal of Therapy:  INR 2-3 Monitor platelets by anticoagulation protocol: Yes   Plan:  Hold warfarin until INR <3 Daily INR and CBC Consider switching to DOAC once INR <2 with addition of amiodarone as it may be extremely difficult to safely dose warfarin with concomitant amiodarone and  achieve therapeutic INRs, since patient's baseline warfarin dose was so low and as patient has only received warfarin 0.5mg  in a week's time and continues to have supratherapeutic INR levels.   Discharge recommendations: IF patient is to be discharged home on warfarin, check INR every 2-3 days and would hold any further warfarin dosing until INR <3.    Thank you for allowing pharmacy to be part of this patients care team.  Wilburn Cornelia, PharmD, BCPS Clinical Pharmacist 08/10/2022 7:49 AM   Please refer to [facility] for pharmacy phone number

## 2022-08-10 NOTE — Progress Notes (Addendum)
PROGRESS NOTE    [name] [name]  GMW:[id number] DOB: 01-24-1941 DOA: 08/03/2022 PCP: [name], MD   Brief Narrative:  [name] [name] is a 82 y.o. female with medical history significant of PAF on Coumadin, ischemic cardiomyopathy with LVEF 45-50%, chronic HFrEF, CKD stage IV, IDDM with insulin resistance, seizure disorder, blindness of left eye recent ESBL UTI, presented with rapid A-fib and worsening of LVEF.  Patient has been admitted with chest pain secondary to paroxysmal atrial flutter/fibrillation and has been started on IV amiodarone per cardiology.  She is also noted to have acute on chronic HFrEF as a result of this.  Underwent DCCV  6/05.  She also appears to have UTI with recent Klebsiella ESBL infection, received fosfomycin 6/05. -Patient develops chest pain 6/06, GI source, troponin negative,, resolved with Maalox.  -6/7 with 1 episode of vomiting. WBC has increase to 23 K, UA abnormal, started imipenem -6/8 with worsening AKI -6/9: Nephrology consulted, given IV fluids  Assessment and Plan:  Atrial flutter with RVR -With worsening of LVEF, cardiology started patient on amiodarone, recommended 400 Mg twice daily for 7 days followed by 200 Mg daily -Underwent DCCV 6/5, subsequently had an episode of A-fib but converted back to sinus rhythm -INR supra therapeutic, continue Coumadin, metoprolol  Acute on chronic HFrEF decompensation -Was volume overloaded on admission, now euvolemic, diuretics on hold -Last echo 6/5 with EF 35-40%, normal RV, concern for mitral valve calcification -Seen by cards, cardiomyopathy suspected to be arrhythmia related, recommended rhythm control with amiodarone -GDMT limited by CKD 4, held Imdur and hydralazine for AKI with soft BPs  AKI on CKD stage IV -Baseline creatinine around 2-2.4 -Creatinine has trended up to 3.6, diuretics have been on hold for few days -Was also treated with Bactrim recently prior to admission for UTI -Suspect  hemodynamically mediated,, hydralazine and Imdur held, renal ultrasound without overt hydronephrosis -Appreciate nephrology input, given IV fluids yesterday, creatinine plateauing  UTI with leukocytosis - Noted patient was treated for UTI recently completed Bactrim on May 25. -Appears to have signs of UTI -Prior urine culture with ESBL Klebsiella infection 5/20 -Received  fosfomycin 6/05 -Worsening leukocytosis 6/07, she was started on imipenem, follow-up urine culture, UA is abnormal, discontinue after tomorrow's dose, D5  Vomiting;  -Resolved   IDDM with insulin resistance -Labile glucose control, hold off 70/30 -Stable, continue low-dose Semglee, Januvia   Hyponatremia-improving -Chronic, probably secondary to CHF, 1 dose of Lasix given,  Chronic anemia -Anemia panel suggestive of chronic disease and mild iron deficiency, will give IV iron   Chest pain;  -Chronic resolved, no further workup planned per cards   DVT prophylaxis:Warfarin Code Status: Full code Family Communication: None at bedside Disposition Plan: Home likely 1 to 2 days   Consultants:  Cardiology  Procedures:  DCCV  Antimicrobials:  None   Subjective: -Feels okay, denies any complaints, frustrated about not being able to go home today  Objective: Vitals:   08/09/22 1951 08/10/22 0016 08/10/22 0451 08/10/22 0753  BP: (!) 124/53 (!) 126/56 (!) 128/48   Pulse: (!) 57 (!) 57 60 65  Resp: 16 15 14 19   Temp: (!) 97.4 F (36.3 C) (!) 97.5 F (36.4 C) 98.4 F (36.9 C)   TempSrc: Oral Oral Oral   SpO2: 98% 95%    Weight:   78.5 kg   Height:        Intake/Output Summary (Last 24 hours) at 08/10/2022 1156 Last data filed at 08/10/2022 0100 Gross per  24 hour  Intake 100.88 ml  Output 400 ml  Net -299.12 ml   Filed Weights   08/08/22 0502 08/09/22 0500 08/10/22 0451  Weight: 75.7 kg 74.3 kg 78.5 kg    Examination:  Gen: Awake, Alert, Oriented X 3,  HEENT: no JVD Lungs: Good air  movement bilaterally, CTAB CVS: S1S2/RRR Abd: soft, Non tender, non distended, BS present Extremities: No edema Skin: no new rashes on exposed skin    Data Reviewed: I have personally reviewed following labs and imaging studies  CBC: Recent Labs  Lab 08/03/22 1655 08/05/22 0108 08/06/22 1103 08/07/22 0124 08/09/22 0104 08/10/22 0105  WBC 14.6* 12.7* 10.3 23.7* 11.6* 9.1  NEUTROABS 10.8*  --   --   --   --   --   HGB 9.3* 8.7* 8.6* 8.9* 7.9* 8.0*  HCT 28.9* 27.2* 26.4* 27.7* 24.4* 25.2*  MCV 89.2 90.7 88.0 89.9 89.1 89.4  PLT 662* 577* 564* 619* 543* 535*   Basic Metabolic Panel: Recent Labs  Lab 08/04/22 0541 08/04/22 1518 08/05/22 0108 08/06/22 1103 08/07/22 0124 08/08/22 0933 08/09/22 0104 08/10/22 0105  NA  --   --    < > 129* 130* 132* 130* 129*  K  --   --    < > 4.1 4.4 4.5 4.3 4.3  CL  --   --    < > 93* 95* 94* 95* 98  CO2  --   --    < > 26 22 25 24 23   GLUCOSE  --   --    < > 260* 136* 116* 138* 116*  BUN  --   --    < > 30* 33* 38* 39* 37*  CREATININE  --   --    < > 2.60* 2.88* 3.45* 3.62* 3.56*  CALCIUM  --   --    < > 7.7* 7.9* 7.7* 7.9* 7.4*  MG 1.9 2.1  --   --   --   --   --   --   PHOS  --  2.5  --   --   --   --   --   --    < > = values in this interval not displayed.   GFR: Estimated Creatinine Clearance: 13.2 mL/min (A) (by C-G formula based on SCr of 3.56 mg/dL (H)). Liver Function Tests: Recent Labs  Lab 08/03/22 1655 08/08/22 0933  AST 51* 40  ALT 21 20  ALKPHOS 101 94  BILITOT 0.3 0.7  PROT 7.4 6.4*  ALBUMIN 2.1* 1.9*   Recent Labs  Lab 08/03/22 1655  LIPASE 21   No results for input(s): "AMMONIA" in the last 168 hours. Coagulation Profile: Recent Labs  Lab 08/06/22 1103 08/07/22 0124 08/08/22 0123 08/09/22 0104 08/10/22 0105  INR 4.0* 4.0* 4.0* 4.2* 4.2*   Cardiac Enzymes: No results for input(s): "CKTOTAL", "CKMB", "CKMBINDEX", "TROPONINI" in the last 168 hours. BNP (last 3 results) No results for input(s):  "PROBNP" in the last 8760 hours. HbA1C: No results for input(s): "HGBA1C" in the last 72 hours. CBG: Recent Labs  Lab 08/09/22 1056 08/09/22 1602 08/09/22 2051 08/10/22 0617 08/10/22 1108  GLUCAP 204* 236* 124* 80 126*   Lipid Profile: No results for input(s): "CHOL", "HDL", "LDLCALC", "TRIG", "CHOLHDL", "LDLDIRECT" in the last 72 hours. Thyroid Function Tests: No results for input(s): "TSH", "T4TOTAL", "FREET4", "T3FREE", "THYROIDAB" in the last 72 hours. Anemia Panel: Recent Labs    08/09/22 1126  VITAMINB12 1,892*  FOLATE 19.6  FERRITIN 177  TIBC 213*  IRON 33  RETICCTPCT 2.4   Sepsis Labs: No results for input(s): "PROCALCITON", "LATICACIDVEN" in the last 168 hours.  No results found for this or any previous visit (from the past 240 hour(s)).       Radiology Studies: US RENAL  Result Date: 08/08/2022 CLINICAL DATA:  Acute kidney injury EXAM: RENAL / URINARY TRACT ULTRASOUND COMPLETE COMPARISON:  CT 07/20/2018 and older FINDINGS: Right Kidney: Renal measurements: 7.0 x 3.6 x 4.1 cm = volume: 64 mL. Small echogenic right kidney. No collecting system dilatation or perinephric fluid. There is an anechoic structure along the right kidney measuring up to 2.6 cm which has smooth margins and through transmission consistent with a benign cysts. No specific follow-up. Left Kidney: Renal measurements: 10.2 x 4.3 x 4.2 cm = volume: 109 mL. Left kidney is partially obscured by overlapping bowel gas and soft tissue. There is mild to moderate collecting system dilatation which is similar to the prior CT scan. There is also a 2.9 Cm cyst along the left kidney which has benign features. Bladder: Contracted Other: None. IMPRESSION: Renal atrophy. Persistent left-sided collecting system dilatation similar to prior CT. Bilateral benign renal cysts.  No specific follow-up of the cysts. Contracted urinary bladder. Electronically Signed   By: Karen Kays M.D.   On: 08/08/2022 15:43         Scheduled Meds:  amiodarone  400 mg Oral BID   Followed by   Melene Muller ON 08/13/2022] amiodarone  200 mg Oral Daily   atorvastatin  80 mg Oral Daily   ezetimibe  10 mg Oral Daily   insulin aspart  0-15 Units Subcutaneous TID WC   insulin aspart  0-5 Units Subcutaneous QHS   insulin glargine-yfgn  10 Units Subcutaneous Daily   linagliptin  5 mg Oral Daily   metoprolol succinate  50 mg Oral Daily   pantoprazole (PROTONIX) IV  40 mg Intravenous Q12H   sodium chloride flush  3 mL Intravenous Q12H   Warfarin - Pharmacist Dosing Inpatient   Does not apply q1600   Continuous Infusions:  sodium chloride 0 mL/hr at 08/03/22 1823   sodium chloride     meropenem (MERREM) IV 500 mg (08/10/22 1000)     LOS: 6 days    Time spent: 35 minutes    Ellayna Hilligoss,MD [facility]  If 7PM-7AM, please contact night-coverage [email or web address] 08/10/2022, 11:56 AM

## 2022-08-10 NOTE — Progress Notes (Signed)
Patient ID: [name] [name], female   DOB: 1940-12-05, 82 y.o.   MRN: [id number] [facility] Progress Note   Assessment/ Plan:   1. Acute kidney Injury on chronic kidney disease stage IIIb (baseline creatinine 1.4-1.7).  Nonoliguric overnight with slight improvement versus unchanged renal function overnight.  Kidney injury appears to have been hemodynamically mediated in the setting of atrial fibrillation with rapid ventricular response and subsequent hypotension.  Hemodynamic status improving with rate control and she was given intravenous fluid bolus yesterday. 2.  Atrial flutter with rapid ventricular response: On my assessment of the bedside monitor, she appears to be back in sinus rhythm status post amiodarone and with good rate control on metoprolol.  Hemodynamically stable.  She is on anticoagulation with warfarin. 3.  Acute exacerbation of congestive heart failure: With history of CHFrEF (EF 35-40%) with likely exacerbation secondary to atrial fibrillation with RVR. 4.  Hyponatremia: Secondary to impaired free water handling in the setting of acute kidney injury as well as CHF exacerbation/ADH stimulation.  Limit oral fluid intake/hypotonic fluids and monitor with renal recovery.  Will plan to restart furosemide when permitted by improving renal function.  Subjective:   Denies any chest pain or shortness of breath.  Inquires about the ability to take a shower.   Objective:   BP (!) 128/48 (BP Location: Left Arm)   Pulse 65   Temp 98.4 F (36.9 C) (Oral)   Resp 19   Ht 5\' 9"  (1.753 m)   Wt 78.5 kg   SpO2 95%   BMI 25.56 kg/m   Intake/Output Summary (Last 24 hours) at 08/10/2022 0820 Last data filed at 08/10/2022 0100 Gross per 24 hour  Intake 100.88 ml  Output 600 ml  Net -499.12 ml   Weight change: 4.246 kg  Physical Exam: Gen: Comfortably resting in bed, easy to awaken and engage in conversation CVS: Pulse regular rhythm, normal rate, S1 and S2 normal Resp:  Clear to auscultation bilaterally without any rales/rhonchi Abd: Soft, flat, nontender, bowel sounds normal Ext: No lower extremity edema  Imaging: US RENAL  Result Date: 08/08/2022 CLINICAL DATA:  Acute kidney injury EXAM: RENAL / URINARY TRACT ULTRASOUND COMPLETE COMPARISON:  CT 07/20/2018 and older FINDINGS: Right Kidney: Renal measurements: 7.0 x 3.6 x 4.1 cm = volume: 64 mL. Small echogenic right kidney. No collecting system dilatation or perinephric fluid. There is an anechoic structure along the right kidney measuring up to 2.6 cm which has smooth margins and through transmission consistent with a benign cysts. No specific follow-up. Left Kidney: Renal measurements: 10.2 x 4.3 x 4.2 cm = volume: 109 mL. Left kidney is partially obscured by overlapping bowel gas and soft tissue. There is mild to moderate collecting system dilatation which is similar to the prior CT scan. There is also a 2.9 Cm cyst along the left kidney which has benign features. Bladder: Contracted Other: None. IMPRESSION: Renal atrophy. Persistent left-sided collecting system dilatation similar to prior CT. Bilateral benign renal cysts.  No specific follow-up of the cysts. Contracted urinary bladder. Electronically Signed   By: Karen Kays M.D.   On: 08/08/2022 15:43    Labs: BMET Recent Labs  Lab 08/04/22 0001 08/04/22 1518 08/05/22 0108 08/06/22 1103 08/07/22 0124 08/08/22 0933 08/09/22 0104 08/10/22 0105  NA 128*  --  133* 129* 130* 132* 130* 129*  K 3.5  --  4.5 4.1 4.4 4.5 4.3 4.3  CL 92*  --  94* 93* 95* 94* 95* 98  CO2 26  --  29 26 22 25 24 23   GLUCOSE 298*  --  262* 260* 136* 116* 138* 116*  BUN 29*  --  27* 30* 33* 38* 39* 37*  CREATININE 2.41*  --  2.63* 2.60* 2.88* 3.45* 3.62* 3.56*  CALCIUM 7.6*  --  7.9* 7.7* 7.9* 7.7* 7.9* 7.4*  PHOS  --  2.5  --   --   --   --   --   --    CBC Recent Labs  Lab 08/03/22 1655 08/05/22 0108 08/06/22 1103 08/07/22 0124 08/09/22 0104 08/10/22 0105  WBC 14.6*    < > 10.3 23.7* 11.6* 9.1  NEUTROABS 10.8*  --   --   --   --   --   HGB 9.3*   < > 8.6* 8.9* 7.9* 8.0*  HCT 28.9*   < > 26.4* 27.7* 24.4* 25.2*  MCV 89.2   < > 88.0 89.9 89.1 89.4  PLT 662*   < > 564* 619* 543* 535*   < > = values in this interval not displayed.    Medications:     amiodarone  400 mg Oral BID   Followed by   Melene Muller ON 08/13/2022] amiodarone  200 mg Oral Daily   atorvastatin  80 mg Oral Daily   ezetimibe  10 mg Oral Daily   insulin aspart  0-15 Units Subcutaneous TID WC   insulin aspart  0-5 Units Subcutaneous QHS   insulin glargine-yfgn  10 Units Subcutaneous Daily   linagliptin  5 mg Oral Daily   metoprolol succinate  50 mg Oral Daily   pantoprazole (PROTONIX) IV  40 mg Intravenous Q12H   sodium chloride flush  3 mL Intravenous Q12H   Warfarin - Pharmacist Dosing Inpatient   Does not apply [id number]   Zetta Bills, MD 08/10/2022, 8:20 AM

## 2022-08-10 NOTE — Progress Notes (Signed)
   Heart Failure Stewardship Pharmacist Progress Note   PCP: [name], MD PCP-Cardiologist: Chilton Si, MD    HPI:  82 yo F with PMH of T2DM, CKD IV, CHF/ICM, afib, HTN, HLD, seizures, blindness in L eye, and recent ESBL UTI.   Presented to the ED on 6/3 following PCP appt where she was found to have chest pain, shortness of breath, and nausea. In the ED, was found to be in afib RVR and fluid overloaded. CXR without active cardiopulmonary disease. ECHO 6/5 showed LVEF 35-40%, global hypokinesis, RV normal, and MV echodensity (if clinical concern, recommend TEE to rule out endocarditis). S/p successful DCCV on 6/5. Brief afib overnight 6/5 but return of NSR on amiodarone. Nephrology consulted for renal failure. Meds on hold due to hypotension.   Current HF Medications: Beta Blocker: metoprolol XL 50 mg daily  Prior to admission HF Medications: Diuretic: furosemide 40 mg daily PRN Beta blocker: metoprolol XL 50 mg daily  Pertinent Lab Values: Serum creatinine 3.56, BUN 37, Potassium 4.3, Sodium 129, BNP 1094.9, Magnesium 2.1   Vital Signs: Weight: 173 lbs (admission weight: 171 lbs) Blood pressure: 120/50s  Heart rate: 60s  I/O: net -0.7L  Medication Assistance / Insurance Benefits Check: Does the patient have prescription insurance?  Yes Type of insurance plan: BCBS Medicare  Outpatient Pharmacy:  Prior to admission outpatient pharmacy: [facility] Is the patient willing to use [facility] TOC pharmacy at discharge? Yes Is the patient willing to transition their outpatient pharmacy to utilize a [facility] outpatient pharmacy?   No    Assessment: 1. Acute on chronic systolic CHF (LVEF 35-40%), due to presumed NICM. NYHA class II symptoms. - Off IV lasix. Consider restarting PRN lasix at discharge. Strict I/Os and daily weights. Keep K>4 and Mg>2. - Continue metoprolol XL 50 mg daily - No ACE/ARB/ARNI or MRA with CKD IV - No SGLT2i with ESBL UTI - Off hydralazine and Imdur with  hypotension. Keep SBP >100 per renal.    Plan: 1) Medication changes recommended at this time: - Restart PRN lasix for discharge  2) Patient assistance: - None pending  3)  Education  - Patient has been educated on current HF medications and potential additions to HF medication regimen - Patient verbalizes understanding that over the next few months, these medication doses may change and more medications may be added to optimize HF regimen - Patient has been educated on basic disease state pathophysiology and goals of therapy   Sharen Hones, PharmD, BCPS Heart Failure Stewardship Pharmacist Phone [phone]

## 2022-08-11 ENCOUNTER — Telehealth: Payer: Self-pay

## 2022-08-11 DIAGNOSIS — I4891 Unspecified atrial fibrillation: Secondary | ICD-10-CM | POA: Diagnosis not present

## 2022-08-11 LAB — CBC
HCT: 26.8 % — ABNORMAL LOW (ref 36.0–46.0)
Hemoglobin: 8.4 g/dL — ABNORMAL LOW (ref 12.0–15.0)
MCH: 28 pg (ref 26.0–34.0)
MCHC: 31.3 g/dL (ref 30.0–36.0)
MCV: 89.3 fL (ref 80.0–100.0)
Platelets: 565 10*3/uL — ABNORMAL HIGH (ref 150–400)
RBC: 3 MIL/uL — ABNORMAL LOW (ref 3.87–5.11)
RDW: 13.9 % (ref 11.5–15.5)
WBC: 10.7 10*3/uL — ABNORMAL HIGH (ref 4.0–10.5)
nRBC: 0 % (ref 0.0–0.2)

## 2022-08-11 LAB — BASIC METABOLIC PANEL
Anion gap: 12 (ref 5–15)
BUN: 34 mg/dL — ABNORMAL HIGH (ref 8–23)
CO2: 21 mmol/L — ABNORMAL LOW (ref 22–32)
Calcium: 7.9 mg/dL — ABNORMAL LOW (ref 8.9–10.3)
Chloride: 99 mmol/L (ref 98–111)
Creatinine, Ser: 3.29 mg/dL — ABNORMAL HIGH (ref 0.44–1.00)
GFR, Estimated: 14 mL/min — ABNORMAL LOW (ref >60)
Glucose, Bld: 137 mg/dL — ABNORMAL HIGH (ref 70–99)
Potassium: 4.9 mmol/L (ref 3.5–5.1)
Sodium: 132 mmol/L — ABNORMAL LOW (ref 135–145)

## 2022-08-11 LAB — GLUCOSE, CAPILLARY
Glucose-Capillary: 102 mg/dL — ABNORMAL HIGH (ref 70–99)
Glucose-Capillary: 177 mg/dL — ABNORMAL HIGH (ref 70–99)
Glucose-Capillary: 158 mg/dL — ABNORMAL HIGH (ref 70–99)
Glucose-Capillary: 101 mg/dL — ABNORMAL HIGH (ref 70–99)

## 2022-08-11 LAB — MAGNESIUM: Magnesium: 2 mg/dL (ref 1.7–2.4)

## 2022-08-11 LAB — PROTIME-INR
INR: 4.9 (ref 0.8–1.2)
Prothrombin Time: 46 seconds — ABNORMAL HIGH (ref 11.4–15.2)

## 2022-08-11 MED ORDER — PHYTONADIONE 1 MG/0.5 ML ORAL SOLUTION
0.5000 mg | Freq: Once | ORAL | Status: AC
  Administered 2022-08-11: 0.5 mg via ORAL
  Filled 2022-08-11: qty 0.25

## 2022-08-11 MED ORDER — PHYTONADIONE 1 MG/0.5 ML ORAL SOLUTION
0.2000 mg | Freq: Once | ORAL | Status: AC
  Administered 2022-08-12: 0.2 mg via ORAL
  Filled 2022-08-11: qty 0.5

## 2022-08-11 NOTE — Plan of Care (Signed)
  Problem: Education: Goal: Knowledge of risk factors and measures for prevention of condition will improve 08/11/2022 1151 by Reynold Bowen, RN Outcome: Progressing 08/11/2022 1151 by Reynold Bowen, RN Outcome: Progressing   Problem: Coping: Goal: Psychosocial and spiritual needs will be supported 08/11/2022 1151 by Reynold Bowen, RN Outcome: Progressing 08/11/2022 1151 by Reynold Bowen, RN Outcome: Progressing   Problem: Respiratory: Goal: Will maintain a patent airway 08/11/2022 1151 by Reynold Bowen, RN Outcome: Progressing 08/11/2022 1151 by Reynold Bowen, RN Outcome: Progressing Goal: Complications related to the disease process, condition or treatment will be avoided or minimized 08/11/2022 1151 by Reynold Bowen, RN Outcome: Progressing 08/11/2022 1151 by Reynold Bowen, RN Outcome: Progressing

## 2022-08-11 NOTE — Progress Notes (Signed)
   Nurse called and states patient had multiple episode of NSVT up to 5 beats on telemetry. He has no symptoms. Cardiology had signed off since 08/07/22. LVEF 35-40%. CKD IV. On metoprolol. Advised check Mag and keep K >4 and Mag >2. No additional medical therapy to add at this time.

## 2022-08-11 NOTE — Progress Notes (Signed)
Patient ID: [name] [name], female   DOB: [birth year], 82 y.o.   MRN: [id number] [facility] Progress Note   Assessment/ Plan:   1. Acute kidney Injury on chronic kidney disease stage IIIb (baseline creatinine 1.4-1.7).  Nonoliguric overnight with slight improvement versus unchanged renal function overnight.  Acute kidney injury was hemodynamically mediated in the setting of atrial flutter with RVR/renal hypoperfusion.  Urine output charted appears inaccurate from overnight with continued improvement of renal function, no indication for dialysis. 2.  Atrial flutter with rapid ventricular response: Hemodynamically stable overnight and appears to be back in sinus rhythm status post amiodarone.  She is rate controlled on metoprolol and on anticoagulation with warfarin. 3.  Acute exacerbation of congestive heart failure: With history of CHFrEF (EF 35-40%) with likely exacerbation secondary to atrial fibrillation with RVR.  No indication for diuresis at this time. 4.  Hyponatremia: Secondary to impaired free water handling in the setting of acute kidney injury as well as CHF exacerbation/ADH stimulation.  Slight improvement overnight with improving renal function/urine output.  Subjective:   Without acute clinical concerns overnight.  Denies any chest pain or shortness of breath   Objective:   BP (!) 157/69 (BP Location: Left Arm)   Pulse 66   Temp 97.8 F (36.6 C) (Oral)   Resp 20   Ht 5\' 9"  (1.753 m)   Wt 78.5 kg   SpO2 91%   BMI 25.56 kg/m   Intake/Output Summary (Last 24 hours) at 08/11/2022 0741 Last data filed at 08/11/2022 0349 Gross per 24 hour  Intake 299.85 ml  Output 300 ml  Net -0.15 ml   Weight change:   Physical Exam: Gen: Sleeping comfortably in bed, awakens easily to conversation CVS: Pulse regular rhythm, normal rate, S1 and S2 normal Resp: Clear to auscultation bilaterally without any rales/rhonchi Abd: Soft, flat, nontender, bowel sounds normal Ext:  No lower extremity edema  Imaging: No results found.  Labs: BMET Recent Labs  Lab 08/04/22 1518 08/05/22 0108 08/06/22 1103 08/07/22 0124 08/08/22 0933 08/09/22 0104 08/10/22 0105 08/11/22 0049  NA  --  133* 129* 130* 132* 130* 129* 132*  K  --  4.5 4.1 4.4 4.5 4.3 4.3 4.9  CL  --  94* 93* 95* 94* 95* 98 99  CO2  --  29 26 22 25 24 23  21*  GLUCOSE  --  262* 260* 136* 116* 138* 116* 137*  BUN  --  27* 30* 33* 38* 39* 37* 34*  CREATININE  --  2.63* 2.60* 2.88* 3.45* 3.62* 3.56* 3.29*  CALCIUM  --  7.9* 7.7* 7.9* 7.7* 7.9* 7.4* 7.9*  PHOS 2.5  --   --   --   --   --   --   --    CBC Recent Labs  Lab 08/07/22 0124 08/09/22 0104 08/10/22 0105 08/11/22 0049  WBC 23.7* 11.6* 9.1 10.7*  HGB 8.9* 7.9* 8.0* 8.4*  HCT 27.7* 24.4* 25.2* 26.8*  MCV 89.9 89.1 89.4 89.3  PLT 619* 543* 535* 565*    Medications:     amiodarone  400 mg Oral BID   Followed by   Melene Muller ON 08/13/2022] amiodarone  200 mg Oral Daily   atorvastatin  80 mg Oral Daily   ezetimibe  10 mg Oral Daily   insulin aspart  0-15 Units Subcutaneous TID WC   insulin aspart  0-5 Units Subcutaneous QHS   insulin glargine-yfgn  10 Units Subcutaneous Daily   linagliptin  5 mg  Oral Daily   metoprolol succinate  50 mg Oral Daily   pantoprazole  40 mg Oral BID   sodium chloride flush  3 mL Intravenous Q12H   Warfarin - Pharmacist Dosing Inpatient   Does not apply q1600   Zetta Bills, MD 08/11/2022, 7:41 AM

## 2022-08-11 NOTE — Progress Notes (Signed)
ANTICOAGULATION CONSULT NOTE  Pharmacy Consult for warfarin  Indication: atrial fibrillation  Allergies  Allergen Reactions   Procaine Hcl Anaphylaxis   Amoxicillin Itching    Patient Measurements: Height: 5\' 9"  (175.3 cm) Weight: 78.5 kg (173 lb 1 oz) IBW/kg (Calculated) : 66.2  Vital Signs: Temp: 97.8 F (36.6 C) (06/11 0348) Temp Source: Oral (06/11 0348) BP: 157/69 (06/11 0348) Pulse Rate: 66 (06/11 0348)  Labs: Recent Labs    08/09/22 0104 08/10/22 0105 08/11/22 0049  HGB 7.9* 8.0* 8.4*  HCT 24.4* 25.2* 26.8*  PLT 543* 535* 565*  LABPROT 40.6* 40.6* 46.0*  INR 4.2* 4.2* 4.9*  CREATININE 3.62* 3.56* 3.29*     Estimated Creatinine Clearance: 14.3 mL/min (A) (by C-G formula based on SCr of 3.29 mg/dL (H)).  Assessment: Patient admitted with CC of chest pain, recent admission for AKI. Was found to be in Afib with RVR in ED w fluid overload. She is on chronic warfarin for afib. INR on admission was elevated at 3.9.   PTA  Warfarin dose was 1.25 mg daily, last taken 08/03/22 @ 0800 prior to admission.   Warfarin pharmacy consult was ordered on 08/03/22 and warfarin was held from 6/3-6/5 due to supratherapeutic INR. Patient was initiated on amiodarone IV load on 6/4 and transitioned to PO on 6/6. Warfarin 0.5mg  last given on 6/7 with INR of 4.0.   Amiodarone increases the hypoprothrombinemic effects of warfarin through inhibition of CYP2C9- and CYP3A4-mediated metabolism of warfarin.  INR trending up to 4.9, has remained supratherapeutic despite no further doses of warfarin since 6/7.  Hgb 8.4, Plt 565 - remain stable No s/sx of bleeding noted  Discussed drug interactions and changes in warfarin with addition of amiodarone. Discussed the concern for safety with INR remaining elevated despite no warfarin administration since 08/07/22. Patient and her husband are willing to transition to apixaban. Appropriate dose for patient would be apixaban 2.5mg  po BID. Pt does not  have Medicare Part D coverage and has no prescription insurance benefits. Paperwork for Eliquis patient assistance has been initiated and submitted to provider for signature on 08/11/22 with the assistance of [employer].   Goal of Therapy:  INR 2-3 Monitor platelets by anticoagulation protocol: Yes   Plan:  Give 0.5mg  of vitamin K today Plan for 0.2mg  of vitamin K on 6/12 Discontinue warfarin Hold anticoagulation transition until INR <2 Once INR <2, will plan to initiate apixaban 2.5mg  po daily for Afib. (Dose reduction is warranted for age of 80 years and SCr > 1.5) Daily INR and CBC  Recommend vitamin K 0.2mg  ( ) po daily dosing inpatient to help counter the effects of warfarin and allow for INR to correct to therapeutic levels at minimum. This recommendation is based on the assumption that pt's vitamin K stores are very low due to poor PO intake and lack of vitamin K in diet as well as a change in gut flora due to Bactrim administration prior to admission and meropenem administration during admission for UTIs. The lack of vitamin K in addition to the drug interaction with amiodarone is likely the cause of the INR continuing to trend upward. The low dose is to minimize the risk of complete reversal of anticoagulation and allow INR to drift down slowly.  Discharge recommendations: Discontinue warfarin Prescribe apixaban 2.5mg  BID for first fill to be dispensed from [facility] pharmacy to help with patient access for free 30-day supply Monitor INR in anticoagulation clinic every 2-3 days INR check in clinic scheduled for  6/14 - consider starting Vitamin K tablets (available over-the-counter) once daily based on INR check in clinic on 6/14 Once INR <2, start apixaban 2.5mg  BID F/u approval of patient assistance of Eliquis in outpatient coagulation clinic  Discharge recommendations were discussed with [facility] Anticoag Nurse, Sigurd Sos on 6/11.   Thank you for allowing  pharmacy to be part of this patients care team.  Wilburn Cornelia, PharmD, BCPS Clinical Pharmacist 08/11/2022 8:07 AM   Please refer to [facility] for pharmacy phone number

## 2022-08-11 NOTE — Progress Notes (Signed)
PROGRESS NOTE    COPELYN ENIX  [name]:[id number] DOB: March 27, 1940 DOA: 08/03/2022 PCP: [name] Plump, MD   Brief Narrative:  [name] [name] is a 82 y.o. female with medical history significant of PAF on Coumadin, ischemic cardiomyopathy with LVEF 45-50%, chronic HFrEF, CKD stage IV, IDDM with insulin resistance, seizure disorder, blindness of left eye recent ESBL UTI, presented with rapid A-fib and worsening of LVEF.  Patient has been admitted with chest pain secondary to paroxysmal atrial flutter/fibrillation and has been started on IV amiodarone per cardiology.  She is also noted to have acute on chronic HFrEF as a result of this.  Underwent DCCV  6/05.  She also appears to have UTI with recent Klebsiella ESBL infection, received fosfomycin 6/05. -Patient develops chest pain 6/06, GI source, troponin negative,, resolved with Maalox.  -6/7 with 1 episode of vomiting. WBC has increase to 23 K, UA abnormal, started imipenem -6/8 with worsening AKI -6/9: Nephrology consulted, given IV fluids  Assessment and Plan:  Atrial flutter with RVR -With worsening of LVEF, cardiology started patient on amiodarone, recommended 400 Mg twice daily for 7 days followed by 200 Mg daily -Underwent DCCV 6/5, subsequently had an episode of A-fib but converted back to sinus rhythm -INR remains supratherapeutic, concern with regulating her Coumadin dose, pharmacists recommended changing to Eliquis patient is agreeable well, will transition over to Eliquis at discharge, possibly tomorrow  Acute on chronic HFrEF decompensation -Was volume overloaded on admission, now euvolemic, diuretics on hold -Last echo 6/5 with EF 35-40%, normal RV, concern for mitral valve calcification -Seen by cards, cardiomyopathy suspected to be arrhythmia related, recommended rhythm control with amiodarone -GDMT limited by CKD 4, held Imdur and hydralazine for AKI with soft BPs  AKI on CKD stage IV -Baseline creatinine around  2-2.4 -Creatinine has trended up to 3.6, diuretics have been on hold for few days -Was also treated with Bactrim recently prior to admission for UTI -Suspect hemodynamically mediated,, hydralazine and Imdur held, renal ultrasound without overt hydronephrosis -Appreciate nephrology input, given IV fluids 2 days ago, creatinine finally improving, clinically appears euvolemic, BMP in a.m.  UTI with leukocytosis - Noted patient was treated for UTI recently completed Bactrim on May 25. -Appears to have signs of UTI -Prior urine culture with ESBL Klebsiella infection 5/20 -Received  fosfomycin 6/05 -Worsening leukocytosis 6/07, she was started on imipenem, completes 5 days today, discontinue antibiotics   IDDM with insulin resistance -Labile glucose control, hold off 70/30 -Stable, continue low-dose Semglee, Januvia   Hyponatremia-improving -Chronic, probably secondary to CHF, improving  Chronic anemia -Anemia panel suggestive of chronic disease and mild iron deficiency, given IV iron   Chest pain;  -Chronic resolved, no further workup planned per cards   DVT prophylaxis:Warfarin Code Status: Full code Family Communication: Discussed with spouse at bedside yesterday Disposition Plan: Home likely in am   Consultants:  Cardiology, Renal  Procedures:  DCCV  Antimicrobials:  None   Subjective: -Feels okay, denies any complaints, frustrated about not being able to go home today  Objective: Vitals:   08/11/22 0041 08/11/22 0348 08/11/22 0900 08/11/22 1023  BP: (!) 149/63 (!) 157/69 (!) 152/66   Pulse: 62 66 70   Resp: 18 20    Temp: 97.6 F (36.4 C) 97.8 F (36.6 C) 98.1 F (36.7 C)   TempSrc: Oral Oral Oral   SpO2: 91% 91% 93%   Weight:    77 kg  Height:        Intake/Output Summary (  Last 24 hours) at 08/11/2022 1218 Last data filed at 08/11/2022 1141 Gross per 24 hour  Intake 679.15 ml  Output 300 ml  Net 379.15 ml   Filed Weights   08/09/22 0500 08/10/22  0451 08/11/22 1023  Weight: 74.3 kg 78.5 kg 77 kg    Examination:  Gen: Awake, Alert, Oriented X 3,  HEENT: no JVD Lungs: Good air movement bilaterally, CTAB CVS: S1S2/RRR Abd: soft, Non tender, non distended, BS present Extremities: No edema Skin: no new rashes on exposed skin    Data Reviewed: I have personally reviewed following labs and imaging studies  CBC: Recent Labs  Lab 08/06/22 1103 08/07/22 0124 08/09/22 0104 08/10/22 0105 08/11/22 0049  WBC 10.3 23.7* 11.6* 9.1 10.7*  HGB 8.6* 8.9* 7.9* 8.0* 8.4*  HCT 26.4* 27.7* 24.4* 25.2* 26.8*  MCV 88.0 89.9 89.1 89.4 89.3  PLT 564* 619* 543* 535* 565*   Basic Metabolic Panel: Recent Labs  Lab 08/04/22 1518 08/05/22 0108 08/07/22 0124 08/08/22 0933 08/09/22 0104 08/10/22 0105 08/11/22 0049  NA  --    < > 130* 132* 130* 129* 132*  K  --    < > 4.4 4.5 4.3 4.3 4.9  CL  --    < > 95* 94* 95* 98 99  CO2  --    < > 22 25 24 23  21*  GLUCOSE  --    < > 136* 116* 138* 116* 137*  BUN  --    < > 33* 38* 39* 37* 34*  CREATININE  --    < > 2.88* 3.45* 3.62* 3.56* 3.29*  CALCIUM  --    < > 7.9* 7.7* 7.9* 7.4* 7.9*  MG 2.1  --   --   --   --   --   --   PHOS 2.5  --   --   --   --   --   --    < > = values in this interval not displayed.   GFR: Estimated Creatinine Clearance: 14.3 mL/min (A) (by C-G formula based on SCr of 3.29 mg/dL (H)). Liver Function Tests: Recent Labs  Lab 08/08/22 0933  AST 40  ALT 20  ALKPHOS 94  BILITOT 0.7  PROT 6.4*  ALBUMIN 1.9*   No results for input(s): "LIPASE", "AMYLASE" in the last 168 hours.  No results for input(s): "AMMONIA" in the last 168 hours. Coagulation Profile: Recent Labs  Lab 08/07/22 0124 08/08/22 0123 08/09/22 0104 08/10/22 0105 08/11/22 0049  INR 4.0* 4.0* 4.2* 4.2* 4.9*   Cardiac Enzymes: No results for input(s): "CKTOTAL", "CKMB", "CKMBINDEX", "TROPONINI" in the last 168 hours. BNP (last 3 results) No results for input(s): "PROBNP" in the last 8760  hours. HbA1C: No results for input(s): "HGBA1C" in the last 72 hours. CBG: Recent Labs  Lab 08/10/22 0617 08/10/22 1108 08/10/22 1637 08/11/22 0729 08/11/22 1123  GLUCAP 80 126* 167* 102* 177*   Lipid Profile: No results for input(s): "CHOL", "HDL", "LDLCALC", "TRIG", "CHOLHDL", "LDLDIRECT" in the last 72 hours. Thyroid Function Tests: No results for input(s): "TSH", "T4TOTAL", "FREET4", "T3FREE", "THYROIDAB" in the last 72 hours. Anemia Panel: Recent Labs    08/09/22 1126  VITAMINB12 1,892*  FOLATE 19.6  FERRITIN 177  TIBC 213*  IRON 33  RETICCTPCT 2.4   Sepsis Labs: No results for input(s): "PROCALCITON", "LATICACIDVEN" in the last 168 hours.  No results found for this or any previous visit (from the past 240 hour(s)).    Scheduled Meds:  amiodarone  400 mg Oral BID   Followed by   Melene Muller ON 08/13/2022] amiodarone  200 mg Oral Daily   atorvastatin  80 mg Oral Daily   ezetimibe  10 mg Oral Daily   insulin aspart  0-15 Units Subcutaneous TID WC   insulin aspart  0-5 Units Subcutaneous QHS   insulin glargine-yfgn  10 Units Subcutaneous Daily   linagliptin  5 mg Oral Daily   metoprolol succinate  50 mg Oral Daily   pantoprazole  40 mg Oral BID   [START ON 08/12/2022] phytonadione  0.2 mg Oral Once   sodium chloride flush  3 mL Intravenous Q12H   Warfarin - Pharmacist Dosing Inpatient   Does not apply q1600   Continuous Infusions:  sodium chloride 10 mL/hr at 08/11/22 1031   sodium chloride     ferric gluconate (FERRLECIT) IVPB 135 mL/hr at 08/11/22 1141   meropenem (MERREM) IV Stopped (08/11/22 1105)     LOS: 7 days    Time spent: 35 minutes    Bell Cai,MD [facility]  If 7PM-7AM, please contact night-coverage [email or web address] 08/11/2022, 12:18 PM

## 2022-08-11 NOTE — Progress Notes (Signed)
Notified Dr. Jomarie Longs and Cyndi Bender, NP that CCMD is seeing multiple short runs of nonsustained vtach (up to 5 beats). Patient is asymptomatic and VS unchanged. EKG performed and shows A Fib with PVCs. Order received for mag level.

## 2022-08-11 NOTE — Telephone Encounter (Signed)
I spoke to [place], pharmacist at the hospital who mentioned that patient's INR is elevated during admission due to Antibiotics administered.  She has decide on some treatments including Vitamin K, which I agreed with and we will check her INR Friday, 6/14.  Eventually, they would like to transition her to Eliquis.

## 2022-08-11 NOTE — Progress Notes (Addendum)
   Heart Failure Stewardship Pharmacist Progress Note   PCP: [name], MD PCP-Cardiologist: Chilton Si, MD    HPI:  82 yo F with PMH of T2DM, CKD IV, CHF/ICM, afib, HTN, HLD, seizures, blindness in L eye, and recent ESBL UTI.   Presented to the ED on 6/3 following PCP appt where she was found to have chest pain, shortness of breath, and nausea. In the ED, was found to be in afib RVR and fluid overloaded. CXR without active cardiopulmonary disease. ECHO 6/5 showed LVEF 35-40%, global hypokinesis, RV normal, and MV echodensity (if clinical concern, recommend TEE to rule out endocarditis). S/p successful DCCV on 6/5. Brief afib overnight 6/5 but return of NSR on amiodarone. Nephrology consulted for renal failure. Meds have been on hold due to hypotension.   Current HF Medications: Beta Blocker: metoprolol XL 50 mg daily Other: Ferrlecit 250 mg IV daily  Prior to admission HF Medications: Diuretic: furosemide 40 mg daily PRN Beta blocker: metoprolol XL 50 mg daily  Pertinent Lab Values: Serum creatinine 3.29, BUN 34, Potassium 4.9, Sodium 132, BNP 1094.9, Magnesium 2.1   Vital Signs: Weight: 173 lbs (admission weight: 171 lbs) Blood pressure: 150/60s  Heart rate: 60s  I/O: net -0.7L  Medication Assistance / Insurance Benefits Check: Does the patient have prescription insurance?  No Type of insurance plan: Medicare A/B  Outpatient Pharmacy:  Prior to admission outpatient pharmacy: [facility] Is the patient willing to use [facility] TOC pharmacy at discharge? Yes Is the patient willing to transition their outpatient pharmacy to utilize a [facility] outpatient pharmacy?   No    Assessment: 1. Acute on chronic systolic CHF (LVEF 35-40%), due to presumed NICM. NYHA class II symptoms. - Off IV lasix. Consider restarting PRN lasix at discharge. Strict I/Os and daily weights. Keep K>4 and Mg>2. - Continue metoprolol XL 50 mg daily - No ACE/ARB/ARNI or MRA with CKD IV - No SGLT2i with  ESBL UTI - Off hydralazine and Imdur with hypotension - now improved. Consider restarting at low dose. Keep SBP >100 per renal.    Plan: 1) Medication changes recommended at this time: - Restart hydralazine 12.5 mg TID + Imdur 15 mg daily  2) Patient assistance: - Starting patient assistance for Eliquis  3)  Education  - Patient has been educated on current HF medications and potential additions to HF medication regimen - Patient verbalizes understanding that over the next few months, these medication doses may change and more medications may be added to optimize HF regimen - Patient has been educated on basic disease state pathophysiology and goals of therapy   Sharen Hones, PharmD, BCPS Heart Failure Stewardship Pharmacist Phone [phone]

## 2022-08-12 ENCOUNTER — Inpatient Hospital Stay: Payer: Medicare Other | Admitting: Internal Medicine

## 2022-08-12 ENCOUNTER — Other Ambulatory Visit (HOSPITAL_COMMUNITY): Payer: Self-pay

## 2022-08-12 ENCOUNTER — Telehealth (HOSPITAL_COMMUNITY): Payer: Self-pay

## 2022-08-12 DIAGNOSIS — I4891 Unspecified atrial fibrillation: Secondary | ICD-10-CM | POA: Diagnosis not present

## 2022-08-12 LAB — CBC
HCT: 26.9 % — ABNORMAL LOW (ref 36.0–46.0)
Hemoglobin: 8.5 g/dL — ABNORMAL LOW (ref 12.0–15.0)
MCH: 28.1 pg (ref 26.0–34.0)
MCHC: 31.6 g/dL (ref 30.0–36.0)
MCV: 88.8 fL (ref 80.0–100.0)
Platelets: 582 10*3/uL — ABNORMAL HIGH (ref 150–400)
RBC: 3.03 MIL/uL — ABNORMAL LOW (ref 3.87–5.11)
RDW: 14.1 % (ref 11.5–15.5)
WBC: 10.8 10*3/uL — ABNORMAL HIGH (ref 4.0–10.5)
nRBC: 0 % (ref 0.0–0.2)

## 2022-08-12 LAB — GLUCOSE, CAPILLARY: Glucose-Capillary: 83 mg/dL (ref 70–99)

## 2022-08-12 LAB — BASIC METABOLIC PANEL
Anion gap: 11 (ref 5–15)
BUN: 30 mg/dL — ABNORMAL HIGH (ref 8–23)
CO2: 22 mmol/L (ref 22–32)
Calcium: 8.2 mg/dL — ABNORMAL LOW (ref 8.9–10.3)
Chloride: 99 mmol/L (ref 98–111)
Creatinine, Ser: 2.96 mg/dL — ABNORMAL HIGH (ref 0.44–1.00)
GFR, Estimated: 15 mL/min — ABNORMAL LOW (ref >60)
Glucose, Bld: 87 mg/dL (ref 70–99)
Potassium: 4.6 mmol/L (ref 3.5–5.1)
Sodium: 132 mmol/L — ABNORMAL LOW (ref 135–145)

## 2022-08-12 LAB — PROTIME-INR
INR: 3.9 — ABNORMAL HIGH (ref 0.8–1.2)
Prothrombin Time: 38.3 seconds — ABNORMAL HIGH (ref 11.4–15.2)

## 2022-08-12 MED ORDER — AMIODARONE HCL 200 MG PO TABS
200.0000 mg | ORAL_TABLET | Freq: Every day | ORAL | 0 refills | Status: DC
  Filled 2022-08-12: qty 30, 30d supply, fill #0

## 2022-08-12 MED ORDER — APIXABAN 2.5 MG PO TABS
2.5000 mg | ORAL_TABLET | Freq: Two times a day (BID) | ORAL | 0 refills | Status: DC
  Filled 2022-08-12: qty 60, 30d supply, fill #0

## 2022-08-12 NOTE — Progress Notes (Signed)
ANTICOAGULATION CONSULT NOTE  Pharmacy Consult for warfarin  Indication: atrial fibrillation  Allergies  Allergen Reactions   Procaine Hcl Anaphylaxis   Amoxicillin Itching    Patient Measurements: Height: 5\' 9"  (175.3 cm) Weight: 79.8 kg (175 lb 14.8 oz) IBW/kg (Calculated) : 66.2  Vital Signs: Temp: 97.5 F (36.4 C) (06/12 0611) Temp Source: Oral (06/12 0611) BP: 125/65 (06/12 0611) Pulse Rate: 130 (06/12 0611)  Labs: Recent Labs    08/10/22 0105 08/11/22 0049 08/12/22 0024  HGB 8.0* 8.4* 8.5*  HCT 25.2* 26.8* 26.9*  PLT 535* 565* 582*  LABPROT 40.6* 46.0* 38.3*  INR 4.2* 4.9* 3.9*  CREATININE 3.56* 3.29* 2.96*     Estimated Creatinine Clearance: 17.1 mL/min (A) (by C-G formula based on SCr of 2.96 mg/dL (H)).  Assessment: Patient admitted with CC of chest pain, recent admission for AKI. Was found to be in Afib with RVR in ED w fluid overload. She is on chronic warfarin for afib. INR on admission was elevated at 3.9.   PTA  Warfarin dose was 1.25 mg daily, last taken 08/03/22 @ 0800 prior to admission.   Warfarin pharmacy consult was ordered on 08/03/22 and warfarin was held from 6/3-6/5 due to supratherapeutic INR. Patient was initiated on amiodarone IV load on 6/4 and transitioned to PO on 6/6. Warfarin 0.5mg  last given on 6/7 with INR of 4.0.   Amiodarone increases the hypoprothrombinemic effects of warfarin through inhibition of CYP2C9- and CYP3A4-mediated metabolism of warfarin.  INR trending down to 3.9 after receiving 0.5mg  Vitamin K on 6/11. Pt has remained supratherapeutic despite no further doses of warfarin since 6/7. Pt's response to very small dose of Vitamin K implies significant Vitamin K deficiency  Hgb 8.5, Plt 582 - remains stable No s/sx of bleeding noted  Discussed drug interactions and changes in warfarin with addition of amiodarone. Discussed the concern for safety with INR remaining elevated despite no warfarin administration since 08/07/22.  Patient and her husband are willing to transition to apixaban. Appropriate dose for patient would be apixaban 2.5mg  po BID. Pt does not have Medicare Part D coverage and has no prescription insurance benefits. Paperwork for Eliquis patient assistance has been initiated and submitted to provider for signature on 08/11/22 with the assistance of [employer].   Of note, meropenem (antibiotic therapy) was completed on 6/11 and amiodarone dose is reduced to 200mg  daily starting on 6/12.   Goal of Therapy:  INR 2-3 Monitor platelets by anticoagulation protocol: Yes   Plan:  Give 0.2mg  of vitamin K today Discontinue warfarin Hold anticoagulation transition until INR <2 Once INR <2, will plan to initiate apixaban 2.5mg  po daily for Afib. (Dose reduction is warranted for age of 80 years and SCr > 1.5) Daily INR and CBC   Discharge recommendations: Discontinue warfarin Prescribe apixaban 2.5mg  BID for first fill to be dispensed from [facility] pharmacy to help with patient access for free 30-day supply Monitor INR in anticoagulation clinic every 2-3 days INR check in clinic scheduled for 6/14 - consider starting Vitamin K tablets (available over-the-counter) once daily if INR trends back up or remains >2 Once INR <2, start apixaban 2.5mg  BID F/u approval of patient assistance of Eliquis in outpatient coagulation clinic  Discharge recommendations were discussed with [facility] Anticoag Nurse, Sigurd Sos on 6/11.  Discussed with patient and patient's husband the need for patient to enroll in Medicare Part D coverage in the next open enrollment period this October to be able to afford apixaban  in the future once patient assistance expires, if granted.    Thank you for allowing pharmacy to be part of this patients care team.  Wilburn Cornelia, PharmD, BCPS Clinical Pharmacist 08/12/2022 7:34 AM   Please refer to [facility] for pharmacy phone number

## 2022-08-12 NOTE — Progress Notes (Signed)
   Heart Failure Stewardship Pharmacist Progress Note   PCP: [name], MD PCP-Cardiologist: Chilton Si, MD    HPI:  82 yo F with PMH of T2DM, CKD IV, CHF/ICM, afib, HTN, HLD, seizures, blindness in L eye, and recent ESBL UTI.   Presented to the ED on 6/3 following PCP appt where she was found to have chest pain, shortness of breath, and nausea. In the ED, was found to be in afib RVR and fluid overloaded. CXR without active cardiopulmonary disease. ECHO 6/5 showed LVEF 35-40%, global hypokinesis, RV normal, and MV echodensity (if clinical concern, recommend TEE to rule out endocarditis). S/p successful DCCV on 6/5. Brief afib overnight 6/5 but return of NSR on amiodarone. Nephrology consulted for renal failure. Meds have been on hold due to hypotension.   Current HF Medications: Beta Blocker: metoprolol XL 50 mg daily  Prior to admission HF Medications: Diuretic: furosemide 40 mg daily PRN Beta blocker: metoprolol XL 50 mg daily  Pertinent Lab Values: Serum creatinine 2.96, BUN 30, Potassium 4.6, Sodium 132, BNP 1094.9, Magnesium 2.0  TSAT 16, Ferritin 177  Vital Signs: Weight: 175 lbs (admission weight: 171 lbs) Blood pressure: 120/60s  Heart rate: 70s  I/O: net -0.5L  Medication Assistance / Insurance Benefits Check: Does the patient have prescription insurance?  No Type of insurance plan: Medicare A/B  Outpatient Pharmacy:  Prior to admission outpatient pharmacy: [facility] Is the patient willing to use [facility] pharmacy at discharge? Yes Is the patient willing to transition their outpatient pharmacy to utilize a [facility] outpatient pharmacy?   No    Assessment: 1. Acute on chronic systolic CHF (LVEF 35-40%), due to presumed NICM. NYHA class II symptoms. - Off IV lasix. Consider restarting PRN lasix at discharge. Strict I/Os and daily weights. Keep K>4 and Mg>2. - Continue metoprolol XL 50 mg daily - No ACE/ARB/ARNI or MRA with CKD IV - No SGLT2i with ESBL UTI -  Off hydralazine and Imdur with hypotension - now improved. Consider restarting at low dose. Keep SBP >100 per renal.  - Iron stores low. Received 2 doses of Ferrlecit 250 mg IV.   Plan: 1) Medication changes recommended at this time: - Start PRN lasix at discharge  2) Patient assistance: - Starting patient assistance for Eliquis  3)  Education  - Patient has been educated on current HF medications and potential additions to HF medication regimen - Patient verbalizes understanding that over the next few months, these medication doses may change and more medications may be added to optimize HF regimen - Patient has been educated on basic disease state pathophysiology and goals of therapy   Sharen Hones, PharmD, BCPS Heart Failure Stewardship Pharmacist Phone [phone]

## 2022-08-12 NOTE — Progress Notes (Signed)
Patient ID: [name], female   DOB: 11-12-1940, 82 y.o.   MRN: [id number] [facility] Progress Note   Assessment/ Plan:   1. Acute kidney Injury on chronic kidney disease stage IIIb (baseline creatinine 1.4-1.7).  Nonoliguric overnight with improvement of renal function noted on labs this morning.  Acute kidney injury was hemodynamically mediated in the setting of atrial flutter with RVR/renal hypoperfusion.  She does not have any acute electrolyte abnormalities or indications for dialysis. 2.  Atrial flutter with rapid ventricular response: Hemodynamically stable overnight and appears to be back in sinus rhythm status post amiodarone.  She is rate controlled on metoprolol and on anticoagulation with warfarin. 3.  Acute exacerbation of congestive heart failure: With history of CHFrEF (EF 35-40%) with likely exacerbation secondary to atrial fibrillation with RVR.  No indication for diuresis at this time. 4.  Hyponatremia: Secondary to impaired free water handling in the setting of acute kidney injury as well as CHF exacerbation/ADH stimulation.  Anticipate additional improvement as renal function improves.  Nephrology will sign off at this time and remain available for questions or concerns.  Recommend discharge home if labs in the next 24 hours continue to show downtrending creatinine following which she can follow-up with labs through her PCP.  Subjective:   Overnight telemetry showed episodes of nonsustained ventricular tachycardia-patient was asymptomatic.  Bedside EKG showed atrial fibrillation with PVCs.   Objective:   BP 125/65 (BP Location: Right Arm)   Pulse (!) 130   Temp (!) 97.5 F (36.4 C) (Oral)   Resp 15   Ht 5\' 9"  (1.753 m)   Wt 79.8 kg   SpO2 93%   BMI 25.98 kg/m   Intake/Output Summary (Last 24 hours) at 08/12/2022 4098 Last data filed at 08/12/2022 0636 Gross per 24 hour  Intake 1198.05 ml  Output 1200 ml  Net -1.95 ml   Weight change:    Physical Exam: Gen: W resting in bed, awakens to conversation CVS: Pulse regular rhythm, normal rate, S1 and S2 normal Resp: Clear to auscultation bilaterally without any rales/rhonchi Abd: Soft, flat, nontender, bowel sounds normal Ext: No lower extremity edema  Imaging: No results found.  Labs: BMET Recent Labs  Lab 08/06/22 1103 08/07/22 0124 08/08/22 0933 08/09/22 0104 08/10/22 0105 08/11/22 0049 08/12/22 0024  NA 129* 130* 132* 130* 129* 132* 132*  K 4.1 4.4 4.5 4.3 4.3 4.9 4.6  CL 93* 95* 94* 95* 98 99 99  CO2 26 22 25 24 23  21* 22  GLUCOSE 260* 136* 116* 138* 116* 137* 87  BUN 30* 33* 38* 39* 37* 34* 30*  CREATININE 2.60* 2.88* 3.45* 3.62* 3.56* 3.29* 2.96*  CALCIUM 7.7* 7.9* 7.7* 7.9* 7.4* 7.9* 8.2*   CBC Recent Labs  Lab 08/09/22 0104 08/10/22 0105 08/11/22 0049 08/12/22 0024  WBC 11.6* 9.1 10.7* 10.8*  HGB 7.9* 8.0* 8.4* 8.5*  HCT 24.4* 25.2* 26.8* 26.9*  MCV 89.1 89.4 89.3 88.8  PLT 543* 535* 565* 582*    Medications:     amiodarone  400 mg Oral BID   Followed by   Melene Muller ON 08/13/2022] amiodarone  200 mg Oral Daily   atorvastatin  80 mg Oral Daily   ezetimibe  10 mg Oral Daily   insulin aspart  0-15 Units Subcutaneous TID WC   insulin aspart  0-5 Units Subcutaneous QHS   insulin glargine-yfgn  10 Units Subcutaneous Daily   linagliptin  5 mg Oral Daily   metoprolol succinate  50 mg  Oral Daily   pantoprazole  40 mg Oral BID   phytonadione  0.2 mg Oral Once   sodium chloride flush  3 mL Intravenous Q12H   Warfarin - Pharmacist Dosing Inpatient   Does not apply q1600   Zetta Bills, MD 08/12/2022, 7:38 AM

## 2022-08-12 NOTE — Plan of Care (Signed)
  Problem: Education: Goal: Knowledge of risk factors and measures for prevention of condition will improve Outcome: Progressing   Problem: Coping: Goal: Psychosocial and spiritual needs will be supported Outcome: Progressing   Problem: Respiratory: Goal: Will maintain a patent airway Outcome: Progressing Goal: Complications related to the disease process, condition or treatment will be avoided or minimized Outcome: Progressing   

## 2022-08-12 NOTE — Telephone Encounter (Signed)
Heart Failure Patient Advocate Encounter  Medication assistance forms for Eliquis have been started. Patient has signed forms.  Provider information will need to be completed before submitting.  Forms have been attached to patient chart under 'Media' tab. Routing to appropriate office.  Burnell Blanks, CPhT Rx Patient Advocate Phone: [phone]

## 2022-08-12 NOTE — Discharge Summary (Signed)
Physician Discharge Summary  [name] [name] WNU:[id number] DOB: 09/18/1940 DOA: 08/03/2022  PCP: [name], MD  Admit date: 08/03/2022 Discharge date: 08/12/2022  Time spent: 45 minutes  Recommendations for Outpatient Follow-up:  Heart failure TOC clinic on 6/25 Coumadin clinic on 6/14, start Eliquis then if INR has trended down, we are discontinuing Coumadin PCP in 1 week   Discharge Diagnoses:  Principal Problem:   Atrial fibrillation with rapid ventricular response (HCC) AKI on CKD 4   Acute systolic CHF (congestive heart failure) (HCC)   Cardiomyopathy (HCC)   CKD (chronic kidney disease) stage 4, GFR 15-29 ml/min (HCC)   A-fib (HCC) Type 2 diabetes mellitus  Discharge Condition: Improved  Diet recommendation: Low-sodium, heart healthy, diabetic  Filed Weights   08/10/22 0451 08/11/22 1023 08/12/22 0500  Weight: 78.5 kg 77 kg 79.8 kg    History of present illness:   82 y.o. female with medical history significant of PAF on Coumadin, ischemic cardiomyopathy with LVEF 45-50%, chronic HFrEF, CKD stage IV, IDDM with insulin resistance, seizure disorder, blindness of left eye recent ESBL UTI, presented with rapid A-fib and worsening of LVEF.  Patient has been admitted with chest pain secondary to paroxysmal atrial flutter/fibrillation and has been started on IV amiodarone per cardiology.  She is also noted to have acute on chronic HFrEF as a result of this.  Underwent DCCV  6/05.  She also appears to have UTI with recent Klebsiella ESBL infection, received fosfomycin 6/05. -Patient develops chest pain 6/06, GI source, troponin negative,, resolved with Maalox.  -6/7 with 1 episode of vomiting. WBC has increase to 23 K, UA abnormal, started imipenem -6/8 with worsening AKI -6/9: Nephrology consulted, given IV fluids  Hospital Course:  Atrial flutter with RVR -With worsening of LVEF, cardiology started patient on amiodarone, recommended 400 Mg twice daily for 7 days followed  by 200 Mg daily -Underwent DCCV 6/5, subsequently had an episode of A-fib but converted back to sinus rhythm -INR remains supratherapeutic throughout this admission, concern with regulating her Coumadin dose, pharmacists recommended changing to Eliquis patient is agreeable well, will transition over to Eliquis in 2 days, likely 6/14 if INR is down, 3.9 at discharge today, she has a follow-up in Coumadin clinic on 6/14   Acute on chronic HFrEF decompensation -Was volume overloaded on admission, now euvolemic, diuretics on hold -Last echo 6/5 with EF 35-40%, normal RV, concern for mitral valve calcification -Seen by cards, cardiomyopathy suspected to be arrhythmia related, recommended rhythm control with amiodarone -GDMT limited by CKD 4, held Imdur and hydralazine for AKI with soft BPs   AKI on CKD stage IV -Baseline creatinine around 2-2.4 -Creatinine has trended up to 3.6, diuretics have been on hold for few days -Suspect hemodynamically mediated,, hydralazine and Imdur held, renal ultrasound without overt hydronephrosis -Appreciate nephrology input, given IV fluids 2 days ago, creatinine finally improving, clinically appears euvolemic,, creatinine down to 2.9 today, follow-up with PCP in 1 week, please check BMP at follow-up, advised to resume Lasix as needed for swelling and weight gain   UTI with leukocytosis - Noted patient was treated for UTI recently completed Bactrim on May 25 -Prior urine culture with ESBL Klebsiella infection 5/20 -Worsening leukocytosis 6/07, with abnormal UA, she was started on imipenem, completed 5 days of antibiotics, improved and stable   IDDM with insulin resistance -Labile glucose control, -Now stable, resumed home regimen of 70/30 insulin   Hyponatremia-improving -Chronic, probably secondary to CHF, improving   Chronic anemia -Anemia  panel suggestive of chronic disease and mild iron deficiency, given IV iron   Chest pain;  -Chronic resolved, no  further workup planned per cards  Consultants, cardiology and nephrology  Discharge Exam: Vitals:   08/12/22 0745 08/12/22 0845  BP: 120/85   Pulse: (!) 101   Resp: 14   Temp: 97.7 F (36.5 C)   SpO2: 93% 96%    Gen: Awake, Alert, Oriented X 3,  HEENT: no JVD Lungs: Good air movement bilaterally, CTAB CVS: S1S2/RRR Abd: soft, Non tender, non distended, BS present Extremities: No edema Skin: no new rashes on exposed skin   Discharge Instructions   Discharge Instructions     Diet - low sodium heart healthy   Complete by: As directed    Diet Carb Modified   Complete by: As directed    Increase activity slowly   Complete by: As directed       Allergies as of 08/12/2022       Reactions   Procaine Hcl Anaphylaxis   Amoxicillin Itching        Medication List     STOP taking these medications    warfarin 2.5 MG tablet Commonly known as: COUMADIN       TAKE these medications    amiodarone 200 MG tablet Commonly known as: PACERONE Take 1 tablet (200 mg total) by mouth daily. Start taking on: August 13, 2022   apixaban 2.5 MG Tabs tablet Commonly known as: ELIQUIS Take 1 tablet (2.5 mg total) by mouth 2 (two) times daily. To start likely on Friday 6/14 if recommended by Coumadin clinic after labs done Start taking on: August 14, 2022   atorvastatin 80 MG tablet Commonly known as: LIPITOR Take 1 tablet by mouth once daily   ezetimibe 10 MG tablet Commonly known as: ZETIA Take 1 tablet (10 mg total) by mouth daily.   furosemide 40 MG tablet Commonly known as: LASIX Take 1 tablet (40 mg total) by mouth daily as needed for fluid or edema.   metoprolol succinate 50 MG 24 hr tablet Commonly known as: TOPROL-XL TAKE 1 TABLET BY MOUTH ONCE DAILY . APPOINTMENT REQUIRED FOR FUTURE REFILLS What changed: See the new instructions.   MULTIVITAMIN ADULT PO Take 1 tablet by mouth daily with breakfast.   NovoLIN 70/30 ReliOn (70-30) 100 UNIT/ML  injection Generic drug: insulin NPH-regular Human 10 units in the morning and 5 units at night. What changed:  how much to take how to take this when to take this additional instructions   VITAMIN D (CHOLECALCIFEROL) PO Take 1 tablet by mouth daily.       Allergies  Allergen Reactions   Procaine Hcl Anaphylaxis   Amoxicillin Itching    Follow-up Information     Connect with your PCP/Specialist as discussed. Schedule an appointment as soon as possible for a visit .   Contact information: [email or web address] Call our physician referral line at [phone].         [facility]. Go in 19 day(s).   Specialty: Cardiology Why: Hospital follow up 6/25 @ 12 noon PLEASE bring a current medication list to appointment FREE valet parking, Entrance C, off [employer] information: [address] [id number] mc [place] [address] [phone]        [facility] Follow up.   Specialty: Home Health Services Why: Agency will call you to set up apt times Contact information: [address] [place] [address] [phone]  Alver Sorrow, NP Follow up.   Specialty: Cardiology Why: Follow-up with General Cardiology scheduled for 09/22/2022 at 11:20am. Please arrive 15 minutes early for check-in. If this date/ time does not work for you, please call our office to reschedule. Contact information: [address] [place] [address] [phone]         [name], MD Follow up on 08/21/2022.   Specialty: Internal Medicine Why: @11 :00am Contact information: [address] [place] [address] [phone]                  The results of significant diagnostics from this hospitalization (including imaging, microbiology, ancillary and laboratory) are listed below for reference.    Significant Diagnostic  Studies: US RENAL  Result Date: 08/08/2022 CLINICAL DATA:  Acute kidney injury EXAM: RENAL / URINARY TRACT ULTRASOUND COMPLETE COMPARISON:  CT 07/20/2018 and older FINDINGS: Right Kidney: Renal measurements: 7.0 x 3.6 x 4.1 cm = volume: 64 mL. Small echogenic right kidney. No collecting system dilatation or perinephric fluid. There is an anechoic structure along the right kidney measuring up to 2.6 cm which has smooth margins and through transmission consistent with a benign cysts. No specific follow-up. Left Kidney: Renal measurements: 10.2 x 4.3 x 4.2 cm = volume: 109 mL. Left kidney is partially obscured by overlapping bowel gas and soft tissue. There is mild to moderate collecting system dilatation which is similar to the prior CT scan. There is also a 2.9 Cm cyst along the left kidney which has benign features. Bladder: Contracted Other: None. IMPRESSION: Renal atrophy. Persistent left-sided collecting system dilatation similar to prior CT. Bilateral benign renal cysts.  No specific follow-up of the cysts. Contracted urinary bladder. Electronically Signed   By: Karen Kays M.D.   On: 08/08/2022 15:43   DG CHEST PORT 1 VIEW  Result Date: 08/07/2022 CLINICAL DATA:  Leukocytosis. EXAM: PORTABLE CHEST 1 VIEW COMPARISON:  08/03/2022 CT 07/20/2022 FINDINGS: Stable heart size and mediastinal contours. Aortic atherosclerosis. Mild atelectasis in the lung bases without confluent airspace disease. No pulmonary edema, pleural effusion or pneumothorax. Remote bilateral rib fracture. IMPRESSION: Mild bibasilar atelectasis. Electronically Signed   By: Narda Rutherford M.D.   On: 08/07/2022 17:10   ECHOCARDIOGRAM COMPLETE  Result Date: 08/05/2022    ECHOCARDIOGRAM REPORT   Patient Name:   [name] [name] Date of Exam: 08/05/2022 Medical Rec #:  [id number]         Height:       69.0 in Accession #:    [id number]        Weight:       172.0 lb Date of Birth:  07/30/1940        BSA:          1.938 m Patient Age:    81  years          BP:           117/57 mmHg Patient Gender: F                 HR:           107 bpm. Exam Location:  Inpatient Procedure: 2D Echo, Color Doppler and Cardiac Doppler Indications:    acute systolic chf  History:        Patient has prior history of Echocardiogram examinations, most                 recent 11/06/2020. Cardiomyopathy, CAD, chronic kidney disease,  Arrythmias:Atrial Fibrillation; Risk Factors:Hypertension,                 Dyslipidemia and Diabetes.  Sonographer:    Delcie Roch RDCS Referring Phys: [phone] DEBBY CROSLEY  Sonographer Comments: Image acquisition challenging due to patient body habitus. IMPRESSIONS  1. Left ventricular ejection fraction, by estimation, is 35 to 40%. The left ventricle has moderately decreased function. The left ventricle demonstrates global hypokinesis. Left ventricular diastolic function could not be evaluated.  2. Right ventricular systolic function is normal. The right ventricular size is normal. There is normal pulmonary artery systolic pressure. The estimated right ventricular systolic pressure is 29.4 mmHg.  3. Left atrial size was moderately dilated.  4. There is a mobile echo bright denisty on the anterior mitral valve leaflet that likely represents calcification. If clinical concern for endocarditis, recommend TEE>. The mitral valve is degenerative. Mild mitral valve regurgitation. No evidence of mitral stenosis. Moderate mitral annular calcification.  5. The aortic valve is normal in structure. Aortic valve regurgitation is not visualized. No aortic stenosis is present.  6. The inferior vena cava is normal in size with greater than 50% respiratory variability, suggesting right atrial pressure of 3 mmHg. FINDINGS  Left Ventricle: Left ventricular ejection fraction, by estimation, is 35 to 40%. The left ventricle has moderately decreased function. The left ventricle demonstrates global hypokinesis. The left ventricular internal cavity size  was normal in size. There is no left ventricular hypertrophy. Left ventricular diastolic function could not be evaluated. Right Ventricle: The right ventricular size is normal. No increase in right ventricular wall thickness. Right ventricular systolic function is normal. There is normal pulmonary artery systolic pressure. The tricuspid regurgitant velocity is 2.57 m/s, and  with an assumed right atrial pressure of 3 mmHg, the estimated right ventricular systolic pressure is 29.4 mmHg. Left Atrium: Left atrial size was moderately dilated. Right Atrium: Right atrial size was normal in size. Pericardium: There is no evidence of pericardial effusion. Mitral Valve: There is a mobile echo bright denisty on the anterior mitral valve leaflet that likely represents calcification. If clinical concern for endocarditis, recommend TEE>. The mitral valve is degenerative in appearance. There is mild calcification of the mitral valve leaflet(s). Moderate mitral annular calcification. Mild mitral valve regurgitation. No evidence of mitral valve stenosis. Tricuspid Valve: The tricuspid valve is normal in structure. Tricuspid valve regurgitation is trivial. No evidence of tricuspid stenosis. Aortic Valve: The aortic valve is normal in structure. Aortic valve regurgitation is not visualized. No aortic stenosis is present. Pulmonic Valve: The pulmonic valve was normal in structure. Pulmonic valve regurgitation is not visualized. No evidence of pulmonic stenosis. Aorta: The aortic root is normal in size and structure. Venous: The inferior vena cava is normal in size with greater than 50% respiratory variability, suggesting right atrial pressure of 3 mmHg. IAS/Shunts: No atrial level shunt detected by color flow Doppler.  LEFT VENTRICLE PLAX 2D LVIDd:         4.60 cm LVIDs:         3.80 cm LV PW:         1.10 cm LV IVS:        1.00 cm LVOT diam:     1.90 cm LV SV:         37 LV SV Index:   19 LVOT Area:     2.84 cm  RIGHT VENTRICLE           IVC RV Basal diam:  2.80 cm  IVC  diam: 1.80 cm LEFT ATRIUM              Index        RIGHT ATRIUM           Index LA diam:        4.20 cm  2.17 cm/m   RA Area:     13.50 cm LA Vol (A2C):   107.0 ml 55.22 ml/m  RA Volume:   29.60 ml  15.28 ml/m LA Vol (A4C):   72.2 ml  37.26 ml/m LA Biplane Vol: 88.0 ml  45.42 ml/m  AORTIC VALVE LVOT Vmax:   67.70 cm/s LVOT Vmean:  44.000 cm/s LVOT VTI:    0.131 m  AORTA Ao Root diam: 3.30 cm Ao Asc diam:  3.20 cm TRICUSPID VALVE TR Peak grad:   26.4 mmHg TR Vmax:        257.00 cm/s  SHUNTS Systemic VTI:  0.13 m Systemic Diam: 1.90 cm Armanda Magic MD Electronically signed by Armanda Magic MD Signature Date/Time: 08/05/2022/8:51:01 PM    Final    EP STUDY  Result Date: 08/05/2022 See surgical note for result.  DG Chest Portable 1 View  Result Date: 08/03/2022 CLINICAL DATA:  Epigastric pain with increased pallor and weakness EXAM: PORTABLE CHEST 1 VIEW COMPARISON:  Chest radiograph dated 07/20/2022 FINDINGS: Normal lung volumes. No focal consolidations. No pleural effusion or pneumothorax. Enlarged cardiomediastinal silhouette is likely projectional. Old posterior right sixth rib fracture. IMPRESSION: 1. No active cardiopulmonary disease. 2. Enlarged cardiomediastinal silhouette is likely projectional. Electronically Signed   By: Agustin Cree M.D.   On: 08/03/2022 17:33   DG Femur Min 2 Views Right  Result Date: 07/20/2022 CLINICAL DATA:  Pain after fall EXAM: RIGHT FEMUR 2 VIEWS COMPARISON:  07/15/2018 FINDINGS: Dynamic hip screw in place with long intramedullary rod. Single distal metaphyseal fixation screw along the rod. Underlying osteopenia. Healed fracture of the very proximal femur. Additional areas of injury along the right pubic bone are also stable. Degenerative changes of the knee joint and hip joint. Scattered vascular calcifications. IMPRESSION: Intramedullary rod with dynamic hip screw in place. Healed injury along the proximal femur and adjacent pelvis.  Mild degenerative changes.  Scattered vascular calcifications. Electronically Signed   By: Karen Kays M.D.   On: 07/20/2022 13:18   CT CHEST ABDOMEN PELVIS WO CONTRAST  Result Date: 07/20/2022 CLINICAL DATA:  Fall. EXAM: CT CHEST, ABDOMEN AND PELVIS WITHOUT CONTRAST TECHNIQUE: Multidetector CT imaging of the chest, abdomen and pelvis was performed following the standard protocol without IV contrast. RADIATION DOSE REDUCTION: This exam was performed according to the departmental dose-optimization program which includes automated exposure control, adjustment of the mA and/or kV according to patient size and/or use of iterative reconstruction technique. COMPARISON:  Jul 07, 2022. FINDINGS: CT CHEST FINDINGS Cardiovascular: Atherosclerosis of thoracic aorta is noted without aneurysm or dissection. Normal cardiac size. No pericardial effusion. Coronary artery calcifications are noted. Mediastinum/Nodes: Small sliding-type hiatal hernia. No adenopathy. Thyroid gland is unremarkable. Lungs/Pleura: No pneumothorax or pleural effusion is noted. Minimal right middle lobe and lingular subsegmental atelectasis or scarring is noted. 4 mm right middle lobe nodule is noted on image 106 of series 5. Musculoskeletal: No chest wall mass or suspicious bone lesions identified. CT ABDOMEN PELVIS FINDINGS Hepatobiliary: Cholelithiasis. No biliary dilatation. Liver is unremarkable on these unenhanced images. Pancreas: Stable cystic lesion seen in pancreatic tail, largest measuring 13 mm. No acute inflammation or ductal dilatation is noted. Spleen: Normal in size without focal  abnormality. Adrenals/Urinary Tract: Adrenal glands are unremarkable. Stable right renal cyst is noted. Right renal cortical scarring is again noted. Stable left renal cyst. Small nonobstructive left renal calculus is noted. Mild left hydroureteronephrosis is noted without evidence of obstructing calculus. Urinary bladder is decompressed secondary to Foley  catheter. 8 mm calculus is noted in urinary bladder. Stomach/Bowel: Stomach is within normal limits. Appendix appears normal. No evidence of bowel wall thickening, distention, or inflammatory changes. Vascular/Lymphatic: Aortic atherosclerosis. No enlarged abdominal or pelvic lymph nodes. Reproductive: Uterus and bilateral adnexa are unremarkable. Other: No abdominal wall hernia or abnormality. No abdominopelvic ascites. Musculoskeletal: Status post surgical repair of both proximal femurs. No acute osseous abnormality is noted. Old L1 compression fracture is noted. IMPRESSION: No acute traumatic injury seen in the chest, abdomen or pelvis. 4 mm right middle lobe nodule. No follow-up needed if patient is low-risk.This recommendation follows the consensus statement: Guidelines for Management of Incidental Pulmonary Nodules Detected on CT Images: From the Fleischner Society 2017; Radiology 2017; 284:228-243. Small nonobstructive left renal calculus. Mild left hydroureteronephrosis is noted without evidence of obstructing calculus. Urinary bladder is decompressed secondary to Foley catheter. 8 mm calculus noted in urinary bladder. Cholelithiasis without inflammation. Stable cystic lesion seen in pancreatic tail, largest measuring 13 mm. Old L1 compression fracture as well surgical internal fixation of old proximal femoral fractures. Aortic Atherosclerosis (ICD10-I70.0). Electronically Signed   By: Lupita Raider M.D.   On: 07/20/2022 11:10   CT HEAD WO CONTRAST  Result Date: 07/20/2022 CLINICAL DATA:  Status post fall. EXAM: CT HEAD WITHOUT CONTRAST CT CERVICAL SPINE WITHOUT CONTRAST TECHNIQUE: Multidetector CT imaging of the head and cervical spine was performed following the standard protocol without intravenous contrast. Multiplanar CT image reconstructions of the cervical spine were also generated. RADIATION DOSE REDUCTION: This exam was performed according to the departmental dose-optimization program which  includes automated exposure control, adjustment of the mA and/or kV according to patient size and/or use of iterative reconstruction technique. COMPARISON:  None Available. FINDINGS: CT HEAD FINDINGS Brain: No evidence of acute infarction, hemorrhage, hydrocephalus, extra-axial collection or mass lesion/mass effect. Previous left MCA infarct is again noted predominantly involving the left temporal lobe. There is mild low-attenuation within the subcortical and periventricular white matter compatible with chronic microvascular disease. Vascular: No hyperdense vessel or unexpected calcification. Skull: Normal. Negative for fracture or focal lesion. Sinuses/Orbits: No acute finding. Other: None. CT CERVICAL SPINE FINDINGS Alignment: Normal. Skull base and vertebrae: No acute fracture. No primary bone lesion or focal pathologic process. Soft tissues and spinal canal: No prevertebral fluid or swelling. No visible canal hematoma. Disc levels: There is multilevel disc space narrowing and endplate spurring within the cervical spine. Solid fusion of the C6-7 vertebra noted. Upper chest: Negative. Other: None IMPRESSION: 1. No acute intracranial abnormality. 2. Chronic left MCA infarct and small vessel ischemic change. 3. No evidence for cervical spine fracture or subluxation. 4. Cervical degenerative disc disease. Electronically Signed   By: Signa Kell M.D.   On: 07/20/2022 11:09   CT CERVICAL SPINE WO CONTRAST  Result Date: 07/20/2022 CLINICAL DATA:  Status post fall. EXAM: CT HEAD WITHOUT CONTRAST CT CERVICAL SPINE WITHOUT CONTRAST TECHNIQUE: Multidetector CT imaging of the head and cervical spine was performed following the standard protocol without intravenous contrast. Multiplanar CT image reconstructions of the cervical spine were also generated. RADIATION DOSE REDUCTION: This exam was performed according to the departmental dose-optimization program which includes automated exposure control, adjustment of the  mA and/or  kV according to patient size and/or use of iterative reconstruction technique. COMPARISON:  None Available. FINDINGS: CT HEAD FINDINGS Brain: No evidence of acute infarction, hemorrhage, hydrocephalus, extra-axial collection or mass lesion/mass effect. Previous left MCA infarct is again noted predominantly involving the left temporal lobe. There is mild low-attenuation within the subcortical and periventricular white matter compatible with chronic microvascular disease. Vascular: No hyperdense vessel or unexpected calcification. Skull: Normal. Negative for fracture or focal lesion. Sinuses/Orbits: No acute finding. Other: None. CT CERVICAL SPINE FINDINGS Alignment: Normal. Skull base and vertebrae: No acute fracture. No primary bone lesion or focal pathologic process. Soft tissues and spinal canal: No prevertebral fluid or swelling. No visible canal hematoma. Disc levels: There is multilevel disc space narrowing and endplate spurring within the cervical spine. Solid fusion of the C6-7 vertebra noted. Upper chest: Negative. Other: None IMPRESSION: 1. No acute intracranial abnormality. 2. Chronic left MCA infarct and small vessel ischemic change. 3. No evidence for cervical spine fracture or subluxation. 4. Cervical degenerative disc disease. Electronically Signed   By: Signa Kell M.D.   On: 07/20/2022 11:09   DG Chest Port 1 View  Result Date: 07/20/2022 CLINICAL DATA:  Sepsis EXAM: PORTABLE CHEST 1 VIEW COMPARISON:  CXR 06/29/22 FINDINGS: No pleural effusion. No pneumothorax. Normal cardiac and mediastinal contours. Bibasilar atelectasis. No radiographically apparent displaced rib fractures. Visualized upper abdomen is unremarkable. IMPRESSION: Bibasilar atelectasis. Electronically Signed   By: Lorenza Cambridge M.D.   On: 07/20/2022 10:29    Microbiology: No results found for this or any previous visit (from the past 240 hour(s)).   Labs: Basic Metabolic Panel: Recent Labs  Lab 08/08/22 0933  08/09/22 0104 08/10/22 0105 08/11/22 0049 08/12/22 0024  NA 132* 130* 129* 132* 132*  K 4.5 4.3 4.3 4.9 4.6  CL 94* 95* 98 99 99  CO2 25 24 23  21* 22  GLUCOSE 116* 138* 116* 137* 87  BUN 38* 39* 37* 34* 30*  CREATININE 3.45* 3.62* 3.56* 3.29* 2.96*  CALCIUM 7.7* 7.9* 7.4* 7.9* 8.2*  MG  --   --   --  2.0  --    Liver Function Tests: Recent Labs  Lab 08/08/22 0933  AST 40  ALT 20  ALKPHOS 94  BILITOT 0.7  PROT 6.4*  ALBUMIN 1.9*   No results for input(s): "LIPASE", "AMYLASE" in the last 168 hours. No results for input(s): "AMMONIA" in the last 168 hours. CBC: Recent Labs  Lab 08/07/22 0124 08/09/22 0104 08/10/22 0105 08/11/22 0049 08/12/22 0024  WBC 23.7* 11.6* 9.1 10.7* 10.8*  HGB 8.9* 7.9* 8.0* 8.4* 8.5*  HCT 27.7* 24.4* 25.2* 26.8* 26.9*  MCV 89.9 89.1 89.4 89.3 88.8  PLT 619* 543* 535* 565* 582*   Cardiac Enzymes: No results for input(s): "CKTOTAL", "CKMB", "CKMBINDEX", "TROPONINI" in the last 168 hours. BNP: BNP (last 3 results) Recent Labs    07/20/22 1030 08/03/22 1710  BNP 1,142.6* 1,094.9*    ProBNP (last 3 results) No results for input(s): "PROBNP" in the last 8760 hours.  CBG: Recent Labs  Lab 08/11/22 0729 08/11/22 1123 08/11/22 1653 08/11/22 2104 08/12/22 0608  GLUCAP 102* 177* 158* 101* 83       Signed:  Zannie Cove MD.  [facility] 08/12/2022, 10:22 AM

## 2022-08-12 NOTE — TOC Transition Note (Signed)
Transition of Care [facility]) - CM/SW Discharge Note   Patient Details  Name: [name] [name] MRN: [id number] Date of Birth: [birth year]  Transition of Care [facility]) CM/SW Contact:  Leone Haven, RN Phone Number: 08/12/2022, 10:44 AM   Clinical Narrative:    Patient is for dc today, NCM notified Cyndi with [facility] of dc.  Patient will be getting 30 day free Eliqus from [facility] pharmacy prior to dc and patient ast has been started for Eliquis by [facility].   Patient will look into either adding medication coverage at enrollment period, because right now she does not have medication coverage for Eliquis.   Final next level of care: Home w Home Health Services Barriers to Discharge: Continued Medical Work up   Patient Goals and CMS Choice CMS Medicare.gov Compare Post Acute Care list provided to:: Patient Choice offered to / list presented to : Patient  Discharge Placement                         Discharge Plan and Services Additional resources added to the After Visit Summary for   In-house Referral: NA Discharge Planning Services: CM Consult Post Acute Care Choice: Home Health          DME Arranged: N/A DME Agency: NA       HH Arranged: PT HH Agency: [facility] Date [facility] Agency Contacted: 08/06/22 Time HH Agency Contacted: 1534 Representative spoke with at [facility] Agency: Kandee Keen  Social Determinants of Health (SDOH) Interventions SDOH Screenings   Food Insecurity: No Food Insecurity (08/04/2022)  Housing: Low Risk  (08/04/2022)  Transportation Needs: No Transportation Needs (08/04/2022)  Utilities: Not At Risk (08/04/2022)  Alcohol Screen: Low Risk  (08/22/2020)  Depression (PHQ2-9): Low Risk  (05/05/2022)  Financial Resource Strain: Low Risk  (08/22/2020)  Physical Activity: Inactive (08/22/2020)  Social Connections: Moderately Integrated (08/22/2020)  Stress: No Stress Concern Present (08/22/2020)  Tobacco Use: Medium Risk (08/06/2022)     Readmission Risk  Interventions    08/06/2022    3:31 PM 07/23/2022   12:43 PM  Readmission Risk Prevention Plan  Transportation Screening Complete Complete  PCP or Specialist Appt within 5-7 Days  Complete  PCP or Specialist Appt within 3-5 Days Complete   Home Care Screening  Complete  Medication Review (RN CM)  Referral to Pharmacy  HRI or Home Care Consult Complete   Palliative Care Screening Not Applicable   Medication Review (RN Care Manager) Complete

## 2022-08-12 NOTE — Progress Notes (Signed)
Discharge instructions provided. Patient verbalizes understanding of all instructions and follow-up care. Patient discharged to home with all belongings at 1100 on 08/12/22 via wheelchair by NT.

## 2022-08-13 ENCOUNTER — Telehealth: Payer: Self-pay

## 2022-08-13 ENCOUNTER — Telehealth: Payer: Self-pay | Admitting: *Deleted

## 2022-08-13 ENCOUNTER — Telehealth: Payer: Self-pay | Admitting: Internal Medicine

## 2022-08-13 DIAGNOSIS — I5021 Acute systolic (congestive) heart failure: Secondary | ICD-10-CM

## 2022-08-13 MED ORDER — ATORVASTATIN CALCIUM 80 MG PO TABS: 80.0000 mg | ORAL_TABLET | Freq: Every day | ORAL | 1 refills | Status: DC

## 2022-08-13 NOTE — Addendum Note (Signed)
Addended by: Wilford Corner on: 08/13/2022 12:57 PM   Modules accepted: Orders

## 2022-08-13 NOTE — Telephone Encounter (Signed)
Spoke w/ Arnold- verbal orders given.  °

## 2022-08-13 NOTE — Telephone Encounter (Signed)
Thank you :)

## 2022-08-13 NOTE — Progress Notes (Signed)
  Care Coordination  Outreach Note  08/13/2022 Name: [name] [name] MRN: [id number] DOB: April 05, 1940   Care Coordination Outreach Attempts: An unsuccessful telephone outreach was attempted today to offer the patient information about available care coordination services.  Follow Up Plan:  Additional outreach attempts will be made to offer the patient care coordination information and services.   Encounter Outcome:  No Answer  Burman Nieves, CCMA Care Coordination Care Guide Direct Dial: [phone]

## 2022-08-13 NOTE — Telephone Encounter (Signed)
Caller/Agency: Francie Massing  Callback Number: [phone], ok to lvm  Requesting OT/PT/Skilled Nursing/Social Work/Speech Therapy: PT  Frequency: 2x4, 1x1

## 2022-08-13 NOTE — Transitions of Care (Post Inpatient/ED Visit) (Signed)
08/13/2022  Name: [name] [name] MRN: [id number] DOB: [birth year]  Today's TOC FU Call Status: Today's TOC FU Call Status:: Successful TOC FU Call Competed TOC FU Call Complete Date: 08/13/22  Transition Care Management Follow-up Telephone Call Date of Discharge: 08/12/22 Discharge Facility: [facility]) Type of Discharge: Inpatient Admission Primary Inpatient Discharge Diagnosis:: Acute on Chronic Congestive Heart Failure How have you been since you were released from the hospital?: Better (Patient states she is at 89%) Any questions or concerns?: No  Items Reviewed: Did you receive and understand the discharge instructions provided?: Yes Medications obtained,verified, and reconciled?: Yes (Medications Reviewed) Any new allergies since your discharge?: No Dietary orders reviewed?: No Do you have support at home?: Yes People in Home: spouse, child(ren), dependent, grandchild(ren) Name of Support/Comfort Primary Source: [name] spouse  Medications Reviewed Today: Medications Reviewed Today     Reviewed by Jodelle Gross, RN (Case Manager) on 08/13/22 at 1312  Med List Status: <None>   Medication Order Taking? Sig Documenting Provider Last Dose Status Informant  amiodarone (PACERONE) 200 MG tablet [id number] Yes Take 1 tablet (200 mg total) by mouth daily. Zannie Cove, MD Taking Active   apixaban Everlene Balls) 2.5 MG TABS tablet [id number] Yes Take 1 tablet (2.5 mg total) by mouth 2 (two) times daily. To start likely on Friday 6/14 if recommended by Coumadin clinic after labs done Zannie Cove, MD Taking Active   atorvastatin (LIPITOR) 80 MG tablet [id number] Yes Take 1 tablet (80 mg total) by mouth daily. [name], MD Taking Active   ezetimibe (ZETIA) 10 MG tablet [id number] Yes Take 1 tablet (10 mg total) by mouth daily. Chilton Si, MD Taking Active Pharmacy Records, Self  furosemide (LASIX) 40 MG tablet [id number] Yes Take 1 tablet (40 mg total) by mouth daily as  needed for fluid or edema. Lorin Glass, MD Taking Active Self, Pharmacy Records  insulin NPH-regular Human (NOVOLIN 70/30 RELION) (70-30) 100 UNIT/ML injection [id number] Yes 10 units in the morning and 5 units at night.  Patient taking differently: Inject 5-23 Units into the skin daily.   Lorin Glass, MD Taking Active Self, Pharmacy Records  metoprolol succinate (TOPROL-XL) 50 MG 24 hr tablet [id number] Yes TAKE 1 TABLET BY MOUTH ONCE DAILY . APPOINTMENT REQUIRED FOR FUTURE REFILLS  Patient taking differently: Take 50 mg by mouth daily.   Chilton Si, MD Taking Active Pharmacy Records, Self  Multiple Vitamins-Minerals (MULTIVITAMIN ADULT PO) [id number] Yes Take 1 tablet by mouth daily with breakfast.  [provider] Taking Active Pharmacy Records, Self  VITAMIN D, CHOLECALCIFEROL, PO [id number] Yes Take 1 tablet by mouth daily. [provider] Taking Active Pharmacy Records, Self            Home Care and Equipment/Supplies: Were Home Health Services Ordered?: Yes Name of Home Health Agency:: [facility] Has Agency set up a time to come to your home?: Yes First Home Health Visit Date: 08/13/22 Any new equipment or medical supplies ordered?: No  Functional Questionnaire: Do you need assistance with bathing/showering or dressing?: Yes Do you need assistance with meal preparation?: Yes Do you have difficulty maintaining continence: No Do you need assistance with getting out of bed/getting out of a chair/moving?: No Do you have difficulty managing or taking your medications?: Yes  Follow up appointments reviewed: PCP Follow-up appointment confirmed?: Yes Date of PCP follow-up appointment?: 08/21/22 Follow-up Provider: Dr. Drue Novel, PCP Specialist Hospital Follow-up appointment confirmed?: NA Do you need transportation to your follow-up appointment?: No Do you understand  care options if your condition(s) worsen?: Yes-patient verbalized understanding  SDOH Interventions  Today    Flowsheet Row Most Recent Value  SDOH Interventions   Food Insecurity Interventions Intervention Not Indicated  Housing Interventions Intervention Not Indicated  Transportation Interventions Intervention Not Indicated      Interventions Today    Flowsheet Row Most Recent Value  Chronic Disease   Chronic disease during today's visit Congestive Heart Failure (CHF), Hypertension (HTN)  General Interventions   General Interventions Discussed/Reviewed General Interventions Discussed, General Interventions Reviewed, Referral to Nurse  [One attempt made today to schedule]       TOC Interventions Today    Flowsheet Row Most Recent Value  TOC Interventions   TOC Interventions Discussed/Reviewed TOC Interventions Discussed, TOC Interventions Reviewed       Interventions Today    Flowsheet Row Most Recent Value  Chronic Disease   Chronic disease during today's visit Congestive Heart Failure (CHF), Hypertension (HTN)  General Interventions   General Interventions Discussed/Reviewed General Interventions Discussed, General Interventions Reviewed, Referral to Nurse  [One attempt made today to schedule]       TOC Interventions Today    Flowsheet Row Most Recent Value  TOC Interventions   TOC Interventions Discussed/Reviewed TOC Interventions Discussed, TOC Interventions Reviewed      Jodelle Gross, RN, BSN, CCM Care Management Coordinator [facility] Phone: [phone]/Fax: [phone]

## 2022-08-13 NOTE — Telephone Encounter (Signed)
Heather from [place] called today needing verbal orders for  Heart failure and Afib management orders needed for once a week for 4 weeks.  Verbal orders given.

## 2022-08-14 ENCOUNTER — Ambulatory Visit (INDEPENDENT_AMBULATORY_CARE_PROVIDER_SITE_OTHER): Payer: Medicare Other

## 2022-08-14 DIAGNOSIS — I4891 Unspecified atrial fibrillation: Secondary | ICD-10-CM

## 2022-08-14 DIAGNOSIS — Z5181 Encounter for therapeutic drug level monitoring: Secondary | ICD-10-CM | POA: Diagnosis not present

## 2022-08-14 LAB — POCT INR: INR: 3.1 — AB (ref 2.0–3.0)

## 2022-08-14 NOTE — Patient Instructions (Signed)
Transitioning from Coumadin to Eliquis.  START Eliquis morning of 6/15.

## 2022-08-14 NOTE — Progress Notes (Signed)
  Care Coordination   Note   08/14/2022 Name: [name] [name] MRN: [id number] DOB: Jul 26, 1940  [name] [name] is a 82 y.o. year old female who sees Drue Novel, Nolon Rod, MD for primary care. I reached out to [name] by phone today to offer care coordination services.  [name] was given information about Care Coordination services today including:   The Care Coordination services include support from the care team which includes your Nurse Coordinator, Clinical Social Worker, or Pharmacist.  The Care Coordination team is here to help remove barriers to the health concerns and goals most important to you. Care Coordination services are voluntary, and the patient may decline or stop services at any time by request to their care team member.   Care Coordination Consent Status: Patient agreed to services and verbal consent obtained.   Follow up plan:  Telephone appointment with care coordination team member scheduled for:  08/26/2022  Encounter Outcome:  Pt. Scheduled from referral   Burman Nieves, [facility] Care Coordination Care Guide Direct Dial: [phone]

## 2022-08-17 ENCOUNTER — Other Ambulatory Visit (HOSPITAL_COMMUNITY): Payer: Self-pay

## 2022-08-19 NOTE — Telephone Encounter (Signed)
Order received again- re-faxed to [phone]. Form sent for scanning.

## 2022-08-21 ENCOUNTER — Telehealth: Payer: Self-pay

## 2022-08-21 ENCOUNTER — Ambulatory Visit (INDEPENDENT_AMBULATORY_CARE_PROVIDER_SITE_OTHER): Payer: Medicare Other | Admitting: Internal Medicine

## 2022-08-21 ENCOUNTER — Encounter: Payer: Self-pay | Admitting: Internal Medicine

## 2022-08-21 VITALS — BP 122/68 | HR 70 | Temp 98.2°F | Resp 18 | Ht 68.0 in

## 2022-08-21 DIAGNOSIS — D638 Anemia in other chronic diseases classified elsewhere: Secondary | ICD-10-CM

## 2022-08-21 DIAGNOSIS — N179 Acute kidney failure, unspecified: Secondary | ICD-10-CM

## 2022-08-21 DIAGNOSIS — N1832 Chronic kidney disease, stage 3b: Secondary | ICD-10-CM

## 2022-08-21 DIAGNOSIS — Z794 Long term (current) use of insulin: Secondary | ICD-10-CM

## 2022-08-21 DIAGNOSIS — I480 Paroxysmal atrial fibrillation: Secondary | ICD-10-CM

## 2022-08-21 DIAGNOSIS — E1122 Type 2 diabetes mellitus with diabetic chronic kidney disease: Secondary | ICD-10-CM

## 2022-08-21 DIAGNOSIS — I5021 Acute systolic (congestive) heart failure: Secondary | ICD-10-CM | POA: Diagnosis not present

## 2022-08-21 MED ORDER — APIXABAN 2.5 MG PO TABS: 2.5000 mg | ORAL_TABLET | Freq: Two times a day (BID) | ORAL | 3 refills | Status: DC

## 2022-08-21 NOTE — Patient Instructions (Addendum)
Take the medications according to the list provided. Furosemide is only if needed.  Let me (or your heart doctors) know if your blood pressure is going up   Check the  blood pressure regularly BP GOAL is between 110/65 and  135/85. If it is consistently higher or lower, let me know      GO TO THE LAB : Get the blood work     GO TO THE FRONT DESK, PLEASE SCHEDULE YOUR APPOINTMENTS Come back for a checkup in 3 to 4 months    Per our records you are due for your diabetic eye exam. Please contact your eye doctor to schedule an appointment. Please have them send copies of your office visit notes to [place]. Our fax number is [phone]. If you need a referral to an eye doctor please let us know.

## 2022-08-21 NOTE — Telephone Encounter (Signed)
Prolia VOB initiated via [email or web address]  Last Prolia inj: 03/24/22 Next Prolia inj DUE: 09/21/22 

## 2022-08-21 NOTE — Telephone Encounter (Signed)
Printed for Dr Duke Salvia to sign when she returns next week

## 2022-08-21 NOTE — Assessment & Plan Note (Signed)
Hospital follow-up, summary of the admission:  The patient presented to this office with severe epigastric and chest pain, was referred to the ER and subsequently admitted to the hospital. - A flutter with RVR was diagnosed, worsening EF, cardiology was involved, amiodarone started, DCCV August 05, 2022, subsequently had A-fib episode but she went back in sinus rhythm. - Change from Coumadin to Eliquis. - Creatinine increased, renal US no hydronephrosis, nephrology consulted, was recommended to restart Lasix as needed. - Leukocytosis: Suspected to be from a UTI, completed 5 days of imipenem. - DM noted to be labile - Chest pain: No further workup planned. - Anemia given IV dose of iron. A flutter with RVR: Seems to be doing well, anticoagulated with Eliquis, on metoprolol.  Has a follow-up pending with cardiology. AKI: Creatinine increased at the hospital, peaked at 3.6, a couple of BP meds were discontinued in the hospital, she is on Lasix prn, has taken it 1 time since she left the hospital.  Check a BMP. Anemia: Got dose of IV iron in the hospital, etiology is probably chronic disease, check CBC.  Recent hemorrhagic UTI: Currently asymptomatic, had additional antibiotics while in the hospital Abnormal CT of the urinary bladder: To see urology soon. Gait disorder: She feels very good however she still have some trouble ambulating, has PT at home, they are helping her navigate her stairs and using the walker appropriately. RTC 3 months

## 2022-08-21 NOTE — Progress Notes (Signed)
Subjective:    Patient ID: [name] [name], female    DOB: 06-Nov-1940, 82 y.o.   MRN: [id number]  DOS:  08/21/2022 Type of visit - description: Hospital follow-up  Patient was admitted to the hospital, found to be on A. flutter with RVR. Extensive chart reviewed, summarized in the assessment and plan.  At this point she is doing well, back to baseline. No chest pain or difficulty breathing.  No palpitations. No lower extremity edema. No blood in the stools or in the urine No major headache or abdominal pain.   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   HYPERLIPIDEMIA 03/27/2006   HYPERTENSION 03/27/2006   Intertrochanteric fracture of right hip (HCC) 07/13/2012   LUNG NODULE 09/01/2006   Excision, Bx Benighn   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"   Osteopenia 2004   Dexa 2004 showed Osteopenia, DEXA 03/2007 normal   Osteopenia    Other chronic cystitis with hematuria    Pelvic fracture (HCC) 06/16/2016   S/P fall   Recurrent urinary tract infection    Seeing Urology   RETINOPATHY, BACKGROUND NOS 03/27/2006   Seizures (HCC)    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    Past Surgical History:  Procedure Laterality Date   ANKLE FRACTURE SURGERY Bilateral 08/2015   "right one was in 2 places"   CARDIAC CATHETERIZATION N/A 05/17/2015   Procedure: Left Heart Cath and Coronary Angiography;  Surgeon: Corky Crafts, MD;  Location[facility] INVASIVE CV LAB;  Service: Cardiovascular;  Laterality:  N/A;   CARDIAC CATHETERIZATION N/A 05/17/2015   Procedure: Coronary Balloon Angioplasty;  Surgeon: Corky Crafts, MD;  Location: [facility] INVASIVE CV LAB;  Service: Cardiovascular;  Laterality: N/A;   CARDIOVERSION N/A 08/05/2022   Procedure: CARDIOVERSION;  Surgeon: Chilton Si, MD;  Location: [facility] INVASIVE CV LAB;  Service: Cardiovascular;  Laterality: N/A;   FEMUR IM NAIL Right 07/15/2012   Procedure: INTRAMEDULLARY (IM) NAIL HIP;  Surgeon: Eulas Post, MD;  Location: [facility] OR;  Service: Orthopedics;  Laterality: Right;   FEMUR IM NAIL Left 12/02/2015   Procedure: INTRAMEDULLARY (IM) NAIL FEMORAL;  Surgeon: Eldred Manges, MD;  Location: [facility] OR;  Service: Orthopedics;  Laterality: Left;   FRACTURE SURGERY     INCISION AND DRAINAGE HIP Left 01/03/2016   Procedure: IRRIGATION AND DEBRIDEMENT HIP;  Surgeon: Eldred Manges, MD;  Location: [facility] OR;  Service: Orthopedics;  Laterality: Left;   SHOULDER SURGERY Bilateral    "don't remember which side"    Current Outpatient Medications  Medication Instructions   amiodarone (PACERONE) 200 mg, Oral, Daily   apixaban (ELIQUIS) 2.5 mg, Oral, 2 times daily, To start likely on Friday 6/14 if recommended by Coumadin clinic after labs done   atorvastatin (LIPITOR) 80 mg, Oral, Daily   ezetimibe (ZETIA) 10 mg, Oral, Daily   furosemide (LASIX) 40 mg, Oral, Daily PRN   insulin NPH-regular Human (NOVOLIN 70/30 RELION) (  70-30) 100 UNIT/ML injection 10 units in the morning and 5 units at night.   metoprolol succinate (TOPROL-XL) 50 MG 24 hr tablet TAKE 1 TABLET BY MOUTH ONCE DAILY . APPOINTMENT REQUIRED FOR FUTURE REFILLS   Multiple Vitamins-Minerals (MULTIVITAMIN ADULT PO) 1 tablet, Oral, Daily with breakfast   VITAMIN D, CHOLECALCIFEROL, PO 1 tablet, Oral, Daily       Objective:   Physical Exam BP 122/68   Pulse 70   Temp 98.2 F (36.8 C) (Oral)   Resp 18   Ht 5\' 8"  (1.727 m)   SpO2 95%   BMI 26.75 kg/m  General:   Well developed, NAD, BMI  noted. HEENT:  Normocephalic . Face symmetric, atraumatic Lungs:  CTA B Normal respiratory effort, no intercostal retractions, no accessory muscle use. Heart: RRR,  no murmur.  Lower extremities: trace pretibial edema bilaterally, symmetric Skin: Not pale. Not jaundice Neurologic:  alert & oriented X3.  Speech normal, gait not tested, sits in a wheelchair.   Psych--  Cognition and judgment appear intact.  Cooperative with normal attention span and concentration.  Behavior appropriate. No anxious or depressed appearing.      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   T score -1.8 in 2018.   Hip fracture 2017;L1 vertebral fracture 2020 >>> Rx  alendronate 07/2018 H/o lung bx 2008 benign  Abnormal CT pancreas 12-2021: Rx MRI in 1 year  PLAN: Hospital follow-up, summary of the admission:  The patient presented to this office with severe epigastric and chest pain, was referred to the ER and subsequently admitted to the hospital. - A flutter with RVR was diagnosed, worsening EF, cardiology was involved, amiodarone started, DCCV August 05, 2022, subsequently had A-fib episode but she went back in sinus rhythm. - Change from Coumadin to Eliquis. -  Creatinine increased, renal US no hydronephrosis, nephrology consulted, was recommended to restart Lasix as needed. - Leukocytosis: Suspected to be from a UTI, completed 5 days of imipenem. - DM noted to be labile - Chest pain: No further workup planned. - Anemia given IV dose of iron. A flutter with RVR: Seems to be doing well, anticoagulated with Eliquis, on metoprolol.  Has a follow-up pending with cardiology. AKI: Creatinine increased at the hospital, peaked at 3.6, a couple of BP meds were discontinued in the hospital, she is on Lasix prn, has taken it 1 time since she left the hospital.  Check a BMP. Anemia: Got dose of IV iron in the hospital, etiology is probably chronic disease, check CBC.  Recent hemorrhagic UTI: Currently asymptomatic, had additional antibiotics while in the hospital Abnormal CT of the urinary bladder: To see urology soon. Gait disorder: She feels very good however she still have some trouble ambulating, has PT at home, they are helping her navigate her  stairs and using the walker appropriately. RTC 3 months

## 2022-08-24 ENCOUNTER — Telehealth: Payer: Self-pay

## 2022-08-24 ENCOUNTER — Other Ambulatory Visit (INDEPENDENT_AMBULATORY_CARE_PROVIDER_SITE_OTHER): Payer: Medicare Other

## 2022-08-24 DIAGNOSIS — N1832 Chronic kidney disease, stage 3b: Secondary | ICD-10-CM | POA: Diagnosis not present

## 2022-08-24 DIAGNOSIS — I5021 Acute systolic (congestive) heart failure: Secondary | ICD-10-CM | POA: Diagnosis not present

## 2022-08-24 DIAGNOSIS — E1122 Type 2 diabetes mellitus with diabetic chronic kidney disease: Secondary | ICD-10-CM

## 2022-08-24 DIAGNOSIS — Z794 Long term (current) use of insulin: Secondary | ICD-10-CM | POA: Diagnosis not present

## 2022-08-24 LAB — CBC WITH DIFFERENTIAL/PLATELET
Basophils Absolute: 0 10*3/uL (ref 0.0–0.1)
Basophils Relative: 0.3 % (ref 0.0–3.0)
Eosinophils Absolute: 0.1 10*3/uL (ref 0.0–0.7)
Eosinophils Relative: 0.7 % (ref 0.0–5.0)
HCT: 26.1 % — ABNORMAL LOW (ref 36.0–46.0)
Hemoglobin: 8.3 g/dL — ABNORMAL LOW (ref 12.0–15.0)
Lymphocytes Relative: 5.8 % — ABNORMAL LOW (ref 12.0–46.0)
Lymphs Abs: 0.8 10*3/uL (ref 0.7–4.0)
MCHC: 31.8 g/dL (ref 30.0–36.0)
MCV: 88.7 fl (ref 78.0–100.0)
Monocytes Absolute: 1.2 10*3/uL — ABNORMAL HIGH (ref 0.1–1.0)
Monocytes Relative: 8.9 % (ref 3.0–12.0)
Neutro Abs: 11.5 10*3/uL — ABNORMAL HIGH (ref 1.4–7.7)
Neutrophils Relative %: 84.3 % — ABNORMAL HIGH (ref 43.0–77.0)
Platelets: 423 10*3/uL — ABNORMAL HIGH (ref 150.0–400.0)
RBC: 2.94 Mil/uL — ABNORMAL LOW (ref 3.87–5.11)
RDW: 17 % — ABNORMAL HIGH (ref 11.5–15.5)
WBC: 13.6 10*3/uL — ABNORMAL HIGH (ref 4.0–10.5)

## 2022-08-24 LAB — BASIC METABOLIC PANEL
BUN: 49 mg/dL — ABNORMAL HIGH (ref 6–23)
CO2: 27 mEq/L (ref 19–32)
Calcium: 8 mg/dL — ABNORMAL LOW (ref 8.4–10.5)
Chloride: 95 mEq/L — ABNORMAL LOW (ref 96–112)
Creatinine, Ser: 3.11 mg/dL — ABNORMAL HIGH (ref 0.40–1.20)
GFR: 13.62 mL/min — CL (ref >60.00)
Glucose, Bld: 318 mg/dL — ABNORMAL HIGH (ref 70–99)
Potassium: 5 mEq/L (ref 3.5–5.1)
Sodium: 131 mEq/L — ABNORMAL LOW (ref 135–145)

## 2022-08-24 NOTE — Telephone Encounter (Signed)
CRITICAL VALUE STICKER  CRITICAL VALUE: GFR-13.62  MESSENGER (representative from lab): Hope  MD NOTIFIED: Dr.[name]

## 2022-08-24 NOTE — Progress Notes (Signed)
No charge today. Pt here for redraw.

## 2022-08-24 NOTE — Telephone Encounter (Signed)
Creatinine not resulted just yet, last GFR 15 per chart review.

## 2022-08-24 NOTE — Telephone Encounter (Signed)
Orders signed (order number: [id number]) signed and faxed back to [facility] at [phone]. Form sent for scanning.

## 2022-08-25 ENCOUNTER — Other Ambulatory Visit: Payer: Self-pay

## 2022-08-25 ENCOUNTER — Encounter (HOSPITAL_COMMUNITY): Payer: Self-pay

## 2022-08-25 ENCOUNTER — Ambulatory Visit (HOSPITAL_COMMUNITY)
Admit: 2022-08-25 | Discharge: 2022-08-25 | Disposition: A | Discharge status: Home / Self Care | Payer: Medicare Other | Attending: Physician Assistant | Admitting: Physician Assistant

## 2022-08-25 VITALS — BP 132/66 | HR 58 | Wt 166.8 lb

## 2022-08-25 DIAGNOSIS — N184 Chronic kidney disease, stage 4 (severe): Secondary | ICD-10-CM | POA: Insufficient documentation

## 2022-08-25 DIAGNOSIS — Z87440 Personal history of urinary (tract) infections: Secondary | ICD-10-CM | POA: Insufficient documentation

## 2022-08-25 DIAGNOSIS — I5020 Unspecified systolic (congestive) heart failure: Secondary | ICD-10-CM | POA: Diagnosis not present

## 2022-08-25 DIAGNOSIS — I130 Hypertensive heart and chronic kidney disease with heart failure and stage 1 through stage 4 chronic kidney disease, or unspecified chronic kidney disease: Secondary | ICD-10-CM | POA: Insufficient documentation

## 2022-08-25 DIAGNOSIS — I959 Hypotension, unspecified: Secondary | ICD-10-CM | POA: Insufficient documentation

## 2022-08-25 DIAGNOSIS — E1122 Type 2 diabetes mellitus with diabetic chronic kidney disease: Secondary | ICD-10-CM | POA: Diagnosis not present

## 2022-08-25 DIAGNOSIS — I252 Old myocardial infarction: Secondary | ICD-10-CM | POA: Insufficient documentation

## 2022-08-25 DIAGNOSIS — I2510 Atherosclerotic heart disease of native coronary artery without angina pectoris: Secondary | ICD-10-CM | POA: Insufficient documentation

## 2022-08-25 DIAGNOSIS — Z7901 Long term (current) use of anticoagulants: Secondary | ICD-10-CM | POA: Insufficient documentation

## 2022-08-25 DIAGNOSIS — Z794 Long term (current) use of insulin: Secondary | ICD-10-CM | POA: Insufficient documentation

## 2022-08-25 DIAGNOSIS — Z79899 Other long term (current) drug therapy: Secondary | ICD-10-CM | POA: Diagnosis not present

## 2022-08-25 DIAGNOSIS — I480 Paroxysmal atrial fibrillation: Secondary | ICD-10-CM | POA: Diagnosis not present

## 2022-08-25 DIAGNOSIS — N179 Acute kidney failure, unspecified: Secondary | ICD-10-CM | POA: Insufficient documentation

## 2022-08-25 LAB — MICROALBUMIN / CREATININE URINE RATIO
Creatinine,U: 67.8 mg/dL
Microalb Creat Ratio: 26.3 mg/g (ref 0.0–30.0)
Microalb, Ur: 17.9 mg/dL — ABNORMAL HIGH (ref 0.0–1.9)

## 2022-08-25 NOTE — Progress Notes (Signed)
ReDS Vest / Clip - 08/25/22 1300       ReDS Vest / Clip   Station Marker C    Ruler Value 26    ReDS Value Range Moderate volume overload    ReDS Actual Value 37

## 2022-08-25 NOTE — Progress Notes (Signed)
HEART & VASCULAR TRANSITION OF CARE CONSULT NOTE     Referring Physician: Dr. Jomarie [name] Primary Care: Dr. Drue [name] Primary Cardiologist: Dr. Duke [name]  HPI: Referred to clinic by Dr. Jomarie [name] with [facility] for heart failure consultation. 82 y.o. 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   EF 35-40% at time of NSTEMI in 2017.    Echo 09/22: EF 45-50%, rWMA  Admitted with chest pain and rapid atrial flutter earlier this month. CP resolved with rate control. HS troponin negative X 2. Cardiology consulted. She was started on IV amiodarone and underwent DCCV to SR. EF down to 35-40% on echo. Decline in EF felt to be likely arrhythmia related. Later had recurrent CP that improved with GI cocktail and not felt to be cardiac. Had mild volume overload and was given 1 dose IV lasix. GDMT titrated but limited by CKD.  Also treated for recurrent UTI. Towards end of her stay she developed hypotension and AKI. Scr up to 3.6 (2.4 on admit and 1.4-1.8 in early 2024). Nephrology consulted. She was given IVF with improvement in Scr to 2.96 at discharge.  She is here today for hospital follow-up.  She has been feeling well. No chest pain, dyspnea, orthopnea or PND. Has lasix to use PRN but has been using about every other day (no set schedule). Her home weight has been around 160 lb. Denies lower extremity edema. Watching fluid and sodium intake. She is mobile via wheelchair or walker. Can ambulate short distances before she must sit to rest d/t unsteady gait and weakness. Reports multiple prior orthopedic surgeries on both lower extremities. She has PT coming to her home.   Lives with her husband and granddaughter. Granddaughter and husband prepare majority of meals.   Past Medical History:  Diagnosis Date   Atrial fibrillation (HCC)    SVT dx 2007, cath 2007 mild CAD,  then had an cardioversion, ablation; still on coumadin , has occ palpitation, EKG 03-2010 NSR   Blindness of left eye    CKD (chronic kidney disease), stage III (HCC) 06/16/2016   [name] [name] 06/17/2016   DIABETES MELLITUS, TYPE II 03/27/2006   dr [name] [name]   Eye muscle weakness    Right eye weakness after cataract surgery   GERD (gastroesophageal reflux disease) 10/05/2011   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   HYPERLIPIDEMIA 03/27/2006   HYPERTENSION 03/27/2006   Intertrochanteric fracture of right hip (HCC) 07/13/2012   LUNG NODULE 09/01/2006   Excision, Bx Benighn   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 30 tablet 0   apixaban (ELIQUIS) 2.5 MG TABS tablet Take 1 tablet (2.5 mg total) by mouth 2 (two) times daily. To start likely on Friday 6/14 if recommended by Coumadin clinic after labs done 180 tablet 3   atorvastatin (LIPITOR) 80 MG tablet Take  1 tablet (80 mg total) by mouth daily. 90 tablet 1   ezetimibe (ZETIA) 10 MG tablet Take 1 tablet (10 mg total) by mouth daily. 90 tablet 2   furosemide (LASIX) 40 MG tablet Take 1 tablet (40 mg total) by mouth daily as needed for fluid or edema.     insulin NPH-regular Human (NOVOLIN 70/30 RELION) (70-30) 100 UNIT/ML injection 10 units in the morning and 5 units at night. (Patient taking differently: Inject 5-23 Units into the skin daily.)     metoprolol succinate (TOPROL-XL) 50 MG 24 hr tablet TAKE 1 TABLET BY MOUTH ONCE DAILY . APPOINTMENT REQUIRED FOR FUTURE REFILLS (Patient taking  differently: Take 50 mg by mouth daily.) 90 tablet 0   Multiple Vitamins-Minerals (MULTIVITAMIN ADULT PO) Take 1 tablet by mouth daily with breakfast.      VITAMIN D, CHOLECALCIFEROL, PO Take 1 tablet by mouth daily.     No current facility-administered medications for this encounter.    Allergies  Allergen Reactions   Procaine Hcl Anaphylaxis   Amoxicillin Itching      Social History   Socioeconomic History   Marital status: Married    Spouse name: [name] [name]   Number of children: 1   Years of education: College   Highest education level: Not on file  Occupational History   Occupation: retired     Associate Professor: RETIRED  Tobacco Use   Smoking status: Former    Types: Cigarettes    Quit date: 03/02/1978    Years since quitting: 44.5   Smokeless tobacco: Never  Vaping Use   Vaping Use: Never used  Substance and Sexual Activity   Alcohol use: No    Alcohol/week: 0.0 standard drinks of alcohol   Drug use: No   Sexual activity: Not on file  Other Topics Concern   Not on file  Social History Narrative   Patient lives at home spouse. Uses a walker consistently.   Moved from [place] 2004   1 son, problems w/ drugs, passed away March 09, 2016-- OD       Social Determinants of Health   Financial Resource Strain: Low Risk  (08/22/2020)   Overall Financial Resource Strain (CARDIA)    Difficulty of Paying Living Expenses: Not hard at [name]  Food Insecurity: No Food Insecurity (08/13/2022)   Hunger Vital Sign    Worried About Running Out of Food in the Last Year: Never true    Ran Out of Food in the Last Year: Never true  Transportation Needs: No Transportation Needs (08/13/2022)   PRAPARE - Administrator, Civil Service (Medical): No    Lack of Transportation (Non-Medical): No  Physical Activity: Inactive (08/22/2020)   Exercise Vital Sign    Days of Exercise per Week: 0 days    Minutes of Exercise per Session: 0 min  Stress: No Stress Concern Present (08/22/2020)   [employer]  of Occupational Health - Occupational Stress Questionnaire    Feeling of Stress : Not at [name]  Social Connections: Moderately Integrated (08/22/2020)   Social Connection and Isolation Panel [NHANES]    Frequency of Communication with Friends and Family: More than three times a week    Frequency of Social Gatherings with Friends and Family: More than three times a week    Attends Religious Services: More than 4 times per year    Active Member of [employer] or Organizations: No    Attends Banker Meetings: Never    Marital Status: Married  Catering manager  Violence: Not At Risk (08/04/2022)   Humiliation, Afraid, Rape, and Kick questionnaire    Fear of Current or Ex-Partner: No    Emotionally Abused: No    Physically Abused: No    Sexually Abused: No      Family History  Problem Relation Age of Onset   Diabetes Father    Heart attack Father 65   Diabetes Sister    Drug abuse Son    Cancer Neg Hx        no hx of colon or breast cancer    Vitals:   08/25/22 1217  BP: 132/66  Pulse: (!) 58  SpO2: 96%  Weight: 75.7 kg (166 lb 12.8 oz)    PHYSICAL EXAM: General:  Well appearing. No respiratory difficulty HEENT: normal Neck: supple. no JVD. Carotids 2+ bilat; no bruits. No lymphadenopathy or thryomegaly appreciated. Cor: PMI nondisplaced. Regular rate & rhythm. No rubs, gallops or murmurs. Lungs: clear Abdomen: soft, nontender, nondistended. No hepatosplenomegaly. No bruits or masses. Good bowel sounds. Extremities: no cyanosis, clubbing, rash, edema Neuro: alert & oriented x 3, cranial nerves grossly intact. moves [name] 4 extremities w/o difficulty. Affect pleasant.  ECG: Sinus brady 1st degree AVB 56 bpm  ReDS 37%  ASSESSMENT & PLAN: HFrEF -NSTEMI 2017 s/p POBA OM1 -Echo 2017: EF 35-40%, RV okay -Echo 09/22: EF 45-50%, RV okay -Echo 08/05/22: EF 35-40%, RV okay NYHA III, confounded by physical deconditioning -Recent decline in EF felt to be d/t AFL with RVR. S/p  DCCV. Sinus brady today. Not sure that she would be a candidate for AFL ablation given her age and CKD. GDMT  Diuretic-Volume appears okay on exam, ReDS slightly up at 37%. She is prescribed 40 mg lasix PRN but has recently been using about every other day (no set schedule). Recommended she take lasix every other day. Can use extra 40 mg PRN.  BB-Toprol XL 50 mg daily Ace/ARB/ARNI-No, CKD IV with recent AKI MRA-No, CKD IV with recent AKI SGLT2i-No, recurrent UTIs Given Imdur and Hydralazine in hospital and developed hypotension, both meds were stopped. BP okay today but did not add back meds, need to avoid hypotension with recent AKI. Scr > 3.  -Return in 2 weeks to check volume and BMET, if Scr improving and blood pressure is stable would consider adding back Imdur and Hydralazine. Given log to record BP. -Consider repeat echo in a couple of months  Paroxysmal atrial fibrillation/AFL -Known history of PAF -Recent occurrence of Afib with RVR in 06/24. S/p DCCV to SR.  -Sinus brady on ECG today. -Continue amiodarone 200 mg daily. TSH and LFTs checked earlier this month. -Anticoagulated with Eliquis 2.5 mg BID (appropriate dose for age and renal function)  CAD -NSTEMI 2017 s/p POBA to OM1 -Not on aspirin d/t need for anticoagulation.  -Continue statin. Lipid management per Cardiology.  -No angina.  IDDMII -Last A1c was 9.0 -On insulin -No SGLT2i with UTIs  Recent AKI on CKD IV -Prior baseline Scr 1.4-1.8 in early 2024 -Scr up to 3.6 during recent admit in setting of hypotension and volume depletion -Scr 3.11 yesterday. PCP arranged referral to [facility] -BMET 2 weeks  Referred to HFSW (PCP, Medications, Transportation, ETOH Abuse, Drug Abuse, Insurance, Surveyor, quantity ): No Refer to Pharmacy: No Refer to Home Health: No Refer to Advanced Heart Failure Clinic: No  Refer to General Cardiology: No, already established  Follow up  2 weeks to assess volume and titrate GDMT, then  PRN. Keep f/u with Cardiology on 07/23.

## 2022-08-25 NOTE — Patient Instructions (Signed)
Medication Changes:  No changed, continue current medications  Special Instructions // Education:  Do the following things EVERYDAY: Weigh yourself in the morning before breakfast. Write it down and keep it in a log. Take your medicines as prescribed Eat low salt foods--Limit salt (sodium) to 2000 mg per day.  Stay as active as you can everyday Limit all fluids for the day to less than 2 liters   Follow-Up in: 2 weeks with labs (Wed July 10th 12 pm)   If you have any questions, issues, or concerns before your next appointment please call our office at [phone], opt. 2 and leave a message for the triage nurse.

## 2022-08-25 NOTE — Telephone Encounter (Signed)
See results

## 2022-08-25 NOTE — Telephone Encounter (Signed)
Pt ready for scheduling for PROLIA on or after : 09/21/22  Out-of-pocket cost due at time of visit: $0  Primary: MEDICARE Prolia co-insurance: 0% Admin fee co-insurance: 0%  Secondary: [place] [place] Prolia co-insurance:  Admin fee co-insurance:   Medical Benefit Details: Date Benefits were checked: 08/25/22 Deductible: $240 met of $240 required/ Coinsurance: 0%/ Admin Fee: 0%  Prior Auth: N/A PA# Expiration Date:    Pharmacy benefit: Copay $--- If patient wants fill through the pharmacy benefit please send prescription to:  --- , and include estimated need by date in rx notes. Pharmacy will ship medication directly to the office.  Patient NOT eligible for Prolia Copay Card. Copay Card can make patient's cost as little as $25. Link to apply: [email or web address]://www.amgensupportplus.[email or web address]/copay  ** This summary of benefits is an estimation of the patient's out-of-pocket cost. Exact cost may very based on individual plan coverage.

## 2022-08-26 ENCOUNTER — Ambulatory Visit: Payer: Self-pay

## 2022-08-26 NOTE — Patient Outreach (Signed)
  Care Coordination   Initial Visit Note   08/26/2022 Name: TERRIANA BARRERAS MRN: [id number] DOB: 06/07/1940  Deloria Lair Mahler is a 82 y.o. year old female who sees Drue Novel, Nolon Rod, MD for primary care. I spoke with  [name] by phone today.  What matters to the patients health and wellness today?  Mrs. Spillers reports she is doing good. PCP f/u visit completed on 08/21/22 and HF clinic visit completed 08/25/22. She denies any signs/symptoms of HF exacerbation. She denies any signs/symptoms of worsening of A-fib. Mrs. Gravelle is without questions or concerns at this time.  Goals Addressed             This Visit's Progress    continue to improve post hospitalization       Interventions Today    Flowsheet Row Most Recent Value  Chronic Disease   Chronic disease during today's visit Diabetes, Congestive Heart Failure (CHF), Atrial Fibrillation (AFib), Hypertension (HTN)  General Interventions   General Interventions Discussed/Reviewed General Interventions Discussed, Doctor Visits, Annual Eye Exam, Durable Medical Equipment (DME)  Doctor Visits Discussed/Reviewed Doctor Visits Discussed, Doctor Visits Reviewed  [reviewed upcoming/scheduled appointments]  Durable Medical Equipment (DME) Dan Humphreys, Glucomoter, BP Cuff  Exercise Interventions   Exercise Discussed/Reviewed Physical Activity  Physical Activity Discussed/Reviewed Physical Activity Reviewed  [encouraged to stay as active as tolerated]  Education Interventions   Education Provided Provided Education  Provided Verbal Education On Blood Sugar Monitoring, Exercise, Medication, Eye Care, When to see the doctor  [discussed signs/symptoms of heart failure exacerbation and HF zone tool. reviewed patient instructions from HF clinic visit 08/25/22 and PCP visit 08/21/22]  Nutrition Interventions   Nutrition Discussed/Reviewed Nutrition Discussed, Fluid intake, Decreasing salt  [reiterated recommended < 2000mg  salt and <2L/fluid per day]   Pharmacy Interventions   Pharmacy Dicussed/Reviewed Pharmacy Topics Discussed            SDOH assessments and interventions completed:  No recently completed. No changes.   Care Coordination Interventions:  Yes, provided   Follow up plan: Follow up call scheduled for 09/10/22    Encounter Outcome:  Pt. Visit Completed   Kathyrn Sheriff, RN, MSN, BSN, CCM [facility] Care Coordinator [phone]

## 2022-08-26 NOTE — Patient Instructions (Signed)
Visit Information  Thank you for taking time to visit with me today. Please don't hesitate to contact me if I can be of assistance to you.   Following are the goals we discussed today:  Continue to take medications as prescribed Continue to attend provider visits as scheduled Contact your provider with health questions or concerns as needed   Our next appointment is by telephone on 09/10/22 at 1:30 pm  Please call the care guide team at [phone] if you need to cancel or reschedule your appointment.   If you are experiencing a Mental Health or Behavioral Health Crisis or need someone to talk to, please call the Suicide and Crisis Lifeline: 48  Kathyrn Sheriff, RN, MSN, BSN, CCM [facility] Care Coordinator [phone]

## 2022-08-28 NOTE — Telephone Encounter (Signed)
Faxed, confirmation received 

## 2022-08-31 ENCOUNTER — Encounter: Payer: Medicare Other | Admitting: *Deleted

## 2022-08-31 ENCOUNTER — Encounter: Payer: Self-pay | Admitting: Family Medicine

## 2022-08-31 ENCOUNTER — Ambulatory Visit (INDEPENDENT_AMBULATORY_CARE_PROVIDER_SITE_OTHER): Payer: Medicare Other | Admitting: Family Medicine

## 2022-08-31 VITALS — BP 140/84 | HR 62 | Temp 97.7°F

## 2022-08-31 DIAGNOSIS — K644 Residual hemorrhoidal skin tags: Secondary | ICD-10-CM | POA: Diagnosis not present

## 2022-08-31 MED ORDER — NITROGLYCERIN 0.4 % RE OINT
TOPICAL_OINTMENT | RECTAL | 0 refills | Status: DC
Start: 2022-08-31 — End: 2022-09-24

## 2022-08-31 MED ORDER — HYDROCORTISONE 2.5 % EX CREA
TOPICAL_CREAM | Freq: Two times a day (BID) | CUTANEOUS | 0 refills | Status: DC
Start: 2022-08-31 — End: 2022-10-14

## 2022-08-31 NOTE — Patient Instructions (Signed)
Soak the area twice daily and apply the cream/ointment after.   Stay hydrated. Make sure you are not straining for bowel movement.   Let us know if you need anything.

## 2022-08-31 NOTE — Progress Notes (Unsigned)
Called pt to do AWV and she requested to reschedule due to not feeling well. This encounter was created in error - please disregard.

## 2022-08-31 NOTE — Progress Notes (Signed)
Chief Complaint  Patient presents with   Rectal Pain    Subjective: Patient is a 82 y.o. female here for rectal pain. Here w spouse.   Rectal pain over the past week. No injury/trauma. Not a lot of straining. No hx of anal fissures or hemorrhoids. She is on Eliquis for A fib that was started just prior to the pain. She did have some bleeding. BM's are smooth, not hard/small.   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   HYPERLIPIDEMIA 03/27/2006   HYPERTENSION 03/27/2006   Intertrochanteric fracture of right hip (HCC) 07/13/2012   LUNG NODULE 09/01/2006   Excision, Bx Benighn   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 No accessory muscle use Rectal: Examined in the presence of a female chaperone.  At the posterior midline slightly to the left, there is an external hemorrhoid that is tender to palpation.  I do not appreciate any evidence of thrombosis inside of it.  There is no evidence of  fissuring or active bleeding. Psych: Age appropriate judgment and insight, normal affect and mood  Assessment and Plan: External hemorrhoid - Plan: Nitroglycerin 0.4 % OINT, hydrocortisone 2.5 % cream  Sitz baths recommended in addition to the above ointment.  Warned about possible high cost of the nitroglycerin.  Stay hydrated, keep fiber intake high so she is not straining.  Elevated blood pressure likely secondary to pain- monitor.  Follow-up as originally scheduled with her regular PCP. The patient and her spouse voiced understanding and agreement to the plan.  Jilda Roche [place], DO 08/31/22  4:35 PM

## 2022-09-01 ENCOUNTER — Telehealth: Payer: Self-pay | Admitting: Internal Medicine

## 2022-09-01 NOTE — Telephone Encounter (Signed)
The sitz baths and cortisone cream should be enough.

## 2022-09-01 NOTE — Telephone Encounter (Signed)
Called informed the patients husband of PCP response. He did verbalize understanding///they had not gotten a sitz bath yet but he will go to the pharmacy to get one.

## 2022-09-01 NOTE — Telephone Encounter (Signed)
Pt's husband called to discuss medication. Would like a call back

## 2022-09-01 NOTE — Telephone Encounter (Signed)
Pt saw Dr. Carmelia Roller yesterday, will forward to [facility]

## 2022-09-01 NOTE — Telephone Encounter (Signed)
The medication was too expensive $400---Nitroglycerin They did get the cream (hydrocortisone)

## 2022-09-02 ENCOUNTER — Ambulatory Visit: Payer: Medicare Other | Admitting: Internal Medicine

## 2022-09-02 ENCOUNTER — Telehealth: Payer: Self-pay | Admitting: Internal Medicine

## 2022-09-02 NOTE — Telephone Encounter (Signed)
Patient informed. 

## 2022-09-02 NOTE — Telephone Encounter (Signed)
Caller/Agency: Herbert Seta [place]) Callback Number: [phone], ok to LDVM Requesting OT/PT/Skilled Nursing/Social Work/Speech Therapy: Nursing Frequency: Discharge visit  Rep was wanting to know if PCP is ok with discharge w/o visit or if a visit is needed, needed one PRN visit for next week (7.8.24). This is due to pt not wanting to do visit this week.

## 2022-09-02 NOTE — Telephone Encounter (Signed)
[name] [name] (spouse DPR Ok) called stating pt wanted to make sure she could take milk of magnesia given her current state. Please Advise.

## 2022-09-02 NOTE — Telephone Encounter (Signed)
Advised patient: I recommend her to accept nursing, PT, OT and skilled nursing visits. She declines needing okay to discharge her.

## 2022-09-02 NOTE — Telephone Encounter (Signed)
LMOM for Pt informing PCP recommends she complete HH visits w/ nursing. LMOM w/ Herbert Seta informing that PCP recommends visits be completed.

## 2022-09-02 NOTE — Telephone Encounter (Signed)
Please advise 

## 2022-09-06 ENCOUNTER — Other Ambulatory Visit: Payer: Self-pay

## 2022-09-06 ENCOUNTER — Emergency Department (HOSPITAL_COMMUNITY): Payer: Medicare Other

## 2022-09-06 ENCOUNTER — Inpatient Hospital Stay (HOSPITAL_COMMUNITY): Payer: Medicare Other

## 2022-09-06 ENCOUNTER — Inpatient Hospital Stay (HOSPITAL_COMMUNITY)
Admission: EM | Admit: 2022-09-06 | Discharge: 2022-09-24 | DRG: 673 | Disposition: A | Discharge status: Skilled Nursing Facility | Payer: Medicare Other | Attending: Internal Medicine | Admitting: Internal Medicine

## 2022-09-06 DIAGNOSIS — E1122 Type 2 diabetes mellitus with diabetic chronic kidney disease: Secondary | ICD-10-CM | POA: Diagnosis not present

## 2022-09-06 DIAGNOSIS — Z66 Do not resuscitate: Secondary | ICD-10-CM | POA: Diagnosis not present

## 2022-09-06 DIAGNOSIS — I5022 Chronic systolic (congestive) heart failure: Secondary | ICD-10-CM | POA: Diagnosis not present

## 2022-09-06 DIAGNOSIS — I10 Essential (primary) hypertension: Secondary | ICD-10-CM | POA: Diagnosis not present

## 2022-09-06 DIAGNOSIS — Z87440 Personal history of urinary (tract) infections: Secondary | ICD-10-CM

## 2022-09-06 DIAGNOSIS — I639 Cerebral infarction, unspecified: Secondary | ICD-10-CM | POA: Diagnosis present

## 2022-09-06 DIAGNOSIS — R29700 NIHSS score 0: Secondary | ICD-10-CM | POA: Diagnosis present

## 2022-09-06 DIAGNOSIS — R262 Difficulty in walking, not elsewhere classified: Secondary | ICD-10-CM | POA: Diagnosis present

## 2022-09-06 DIAGNOSIS — N179 Acute kidney failure, unspecified: Secondary | ICD-10-CM | POA: Diagnosis present

## 2022-09-06 DIAGNOSIS — M1990 Unspecified osteoarthritis, unspecified site: Secondary | ICD-10-CM | POA: Diagnosis present

## 2022-09-06 DIAGNOSIS — I255 Ischemic cardiomyopathy: Secondary | ICD-10-CM | POA: Diagnosis present

## 2022-09-06 DIAGNOSIS — E860 Dehydration: Secondary | ICD-10-CM | POA: Diagnosis present

## 2022-09-06 DIAGNOSIS — K5900 Constipation, unspecified: Secondary | ICD-10-CM | POA: Diagnosis not present

## 2022-09-06 DIAGNOSIS — E785 Hyperlipidemia, unspecified: Secondary | ICD-10-CM | POA: Diagnosis present

## 2022-09-06 DIAGNOSIS — Z8249 Family history of ischemic heart disease and other diseases of the circulatory system: Secondary | ICD-10-CM

## 2022-09-06 DIAGNOSIS — N170 Acute kidney failure with tubular necrosis: Principal | ICD-10-CM | POA: Diagnosis present

## 2022-09-06 DIAGNOSIS — I480 Paroxysmal atrial fibrillation: Secondary | ICD-10-CM | POA: Diagnosis present

## 2022-09-06 DIAGNOSIS — R7881 Bacteremia: Secondary | ICD-10-CM | POA: Diagnosis not present

## 2022-09-06 DIAGNOSIS — E11649 Type 2 diabetes mellitus with hypoglycemia without coma: Secondary | ICD-10-CM | POA: Diagnosis not present

## 2022-09-06 DIAGNOSIS — I951 Orthostatic hypotension: Secondary | ICD-10-CM | POA: Diagnosis present

## 2022-09-06 DIAGNOSIS — Z888 Allergy status to other drugs, medicaments and biological substances status: Secondary | ICD-10-CM

## 2022-09-06 DIAGNOSIS — N1832 Chronic kidney disease, stage 3b: Secondary | ICD-10-CM | POA: Diagnosis not present

## 2022-09-06 DIAGNOSIS — Z9861 Coronary angioplasty status: Secondary | ICD-10-CM

## 2022-09-06 DIAGNOSIS — Z7189 Other specified counseling: Secondary | ICD-10-CM | POA: Diagnosis not present

## 2022-09-06 DIAGNOSIS — Z8661 Personal history of infections of the central nervous system: Secondary | ICD-10-CM

## 2022-09-06 DIAGNOSIS — B9620 Unspecified Escherichia coli [E. coli] as the cause of diseases classified elsewhere: Secondary | ICD-10-CM | POA: Diagnosis present

## 2022-09-06 DIAGNOSIS — Z1612 Extended spectrum beta lactamase (ESBL) resistance: Secondary | ICD-10-CM | POA: Diagnosis not present

## 2022-09-06 DIAGNOSIS — G9389 Other specified disorders of brain: Secondary | ICD-10-CM | POA: Diagnosis present

## 2022-09-06 DIAGNOSIS — M8580 Other specified disorders of bone density and structure, unspecified site: Secondary | ICD-10-CM | POA: Diagnosis present

## 2022-09-06 DIAGNOSIS — E11319 Type 2 diabetes mellitus with unspecified diabetic retinopathy without macular edema: Secondary | ICD-10-CM | POA: Diagnosis present

## 2022-09-06 DIAGNOSIS — N3001 Acute cystitis with hematuria: Secondary | ICD-10-CM

## 2022-09-06 DIAGNOSIS — I130 Hypertensive heart and chronic kidney disease with heart failure and stage 1 through stage 4 chronic kidney disease, or unspecified chronic kidney disease: Secondary | ICD-10-CM | POA: Diagnosis present

## 2022-09-06 DIAGNOSIS — I2510 Atherosclerotic heart disease of native coronary artery without angina pectoris: Secondary | ICD-10-CM | POA: Diagnosis present

## 2022-09-06 DIAGNOSIS — K921 Melena: Secondary | ICD-10-CM | POA: Diagnosis not present

## 2022-09-06 DIAGNOSIS — Z880 Allergy status to penicillin: Secondary | ICD-10-CM

## 2022-09-06 DIAGNOSIS — D649 Anemia, unspecified: Secondary | ICD-10-CM | POA: Diagnosis not present

## 2022-09-06 DIAGNOSIS — N136 Pyonephrosis: Secondary | ICD-10-CM | POA: Diagnosis present

## 2022-09-06 DIAGNOSIS — Z7901 Long term (current) use of anticoagulants: Secondary | ICD-10-CM

## 2022-09-06 DIAGNOSIS — G40909 Epilepsy, unspecified, not intractable, without status epilepticus: Secondary | ICD-10-CM | POA: Diagnosis present

## 2022-09-06 DIAGNOSIS — Z87891 Personal history of nicotine dependence: Secondary | ICD-10-CM

## 2022-09-06 DIAGNOSIS — Z87892 Personal history of anaphylaxis: Secondary | ICD-10-CM

## 2022-09-06 DIAGNOSIS — E1165 Type 2 diabetes mellitus with hyperglycemia: Secondary | ICD-10-CM | POA: Diagnosis not present

## 2022-09-06 DIAGNOSIS — E119 Type 2 diabetes mellitus without complications: Secondary | ICD-10-CM

## 2022-09-06 DIAGNOSIS — I4891 Unspecified atrial fibrillation: Secondary | ICD-10-CM | POA: Diagnosis not present

## 2022-09-06 DIAGNOSIS — R0902 Hypoxemia: Secondary | ICD-10-CM | POA: Diagnosis not present

## 2022-09-06 DIAGNOSIS — D631 Anemia in chronic kidney disease: Secondary | ICD-10-CM | POA: Diagnosis present

## 2022-09-06 DIAGNOSIS — Z7983 Long term (current) use of bisphosphonates: Secondary | ICD-10-CM

## 2022-09-06 DIAGNOSIS — K644 Residual hemorrhoidal skin tags: Secondary | ICD-10-CM | POA: Diagnosis present

## 2022-09-06 DIAGNOSIS — E861 Hypovolemia: Secondary | ICD-10-CM | POA: Diagnosis present

## 2022-09-06 DIAGNOSIS — I6340 Cerebral infarction due to embolism of unspecified cerebral artery: Secondary | ICD-10-CM | POA: Diagnosis not present

## 2022-09-06 DIAGNOSIS — Z515 Encounter for palliative care: Secondary | ICD-10-CM

## 2022-09-06 DIAGNOSIS — E871 Hypo-osmolality and hyponatremia: Secondary | ICD-10-CM | POA: Diagnosis not present

## 2022-09-06 DIAGNOSIS — Z833 Family history of diabetes mellitus: Secondary | ICD-10-CM

## 2022-09-06 DIAGNOSIS — Z79899 Other long term (current) drug therapy: Secondary | ICD-10-CM

## 2022-09-06 DIAGNOSIS — Z794 Long term (current) use of insulin: Secondary | ICD-10-CM | POA: Diagnosis not present

## 2022-09-06 DIAGNOSIS — K219 Gastro-esophageal reflux disease without esophagitis: Secondary | ICD-10-CM | POA: Diagnosis present

## 2022-09-06 DIAGNOSIS — B961 Klebsiella pneumoniae [K. pneumoniae] as the cause of diseases classified elsewhere: Secondary | ICD-10-CM | POA: Diagnosis present

## 2022-09-06 DIAGNOSIS — H5462 Unqualified visual loss, left eye, normal vision right eye: Secondary | ICD-10-CM | POA: Diagnosis present

## 2022-09-06 DIAGNOSIS — N189 Chronic kidney disease, unspecified: Secondary | ICD-10-CM | POA: Diagnosis not present

## 2022-09-06 DIAGNOSIS — Z1152 Encounter for screening for COVID-19: Secondary | ICD-10-CM | POA: Diagnosis not present

## 2022-09-06 DIAGNOSIS — E875 Hyperkalemia: Secondary | ICD-10-CM | POA: Diagnosis not present

## 2022-09-06 DIAGNOSIS — Z9189 Other specified personal risk factors, not elsewhere classified: Secondary | ICD-10-CM | POA: Diagnosis not present

## 2022-09-06 DIAGNOSIS — N184 Chronic kidney disease, stage 4 (severe): Secondary | ICD-10-CM | POA: Diagnosis not present

## 2022-09-06 DIAGNOSIS — I5023 Acute on chronic systolic (congestive) heart failure: Secondary | ICD-10-CM | POA: Insufficient documentation

## 2022-09-06 DIAGNOSIS — N390 Urinary tract infection, site not specified: Secondary | ICD-10-CM | POA: Diagnosis present

## 2022-09-06 LAB — SARS CORONAVIRUS 2 BY RT PCR: SARS Coronavirus 2 by RT PCR: NEGATIVE

## 2022-09-06 LAB — CBG MONITORING, ED
Glucose-Capillary: 486 mg/dL — ABNORMAL HIGH (ref 70–99)
Glucose-Capillary: 446 mg/dL — ABNORMAL HIGH (ref 70–99)
Glucose-Capillary: 379 mg/dL — ABNORMAL HIGH (ref 70–99)

## 2022-09-06 LAB — CBC WITH DIFFERENTIAL/PLATELET
Abs Immature Granulocytes: 0 10*3/uL (ref 0.00–0.07)
Basophils Absolute: 0 10*3/uL (ref 0.0–0.1)
Basophils Relative: 0 %
Eosinophils Absolute: 0.3 10*3/uL (ref 0.0–0.5)
Eosinophils Relative: 1 %
HCT: 24.9 % — ABNORMAL LOW (ref 36.0–46.0)
Hemoglobin: 8 g/dL — ABNORMAL LOW (ref 12.0–15.0)
Lymphocytes Relative: 2 %
Lymphs Abs: 0.5 10*3/uL — ABNORMAL LOW (ref 0.7–4.0)
MCH: 27.6 pg (ref 26.0–34.0)
MCHC: 32.1 g/dL (ref 30.0–36.0)
MCV: 85.9 fL (ref 80.0–100.0)
Monocytes Absolute: 0.5 10*3/uL (ref 0.1–1.0)
Monocytes Relative: 2 %
Neutro Abs: 25.4 10*3/uL — ABNORMAL HIGH (ref 1.7–7.7)
Neutrophils Relative %: 95 %
Platelets: 495 10*3/uL — ABNORMAL HIGH (ref 150–400)
RBC: 2.9 MIL/uL — ABNORMAL LOW (ref 3.87–5.11)
RDW: 17.1 % — ABNORMAL HIGH (ref 11.5–15.5)
WBC: 26.7 10*3/uL — ABNORMAL HIGH (ref 4.0–10.5)
nRBC: 0 % (ref 0.0–0.2)

## 2022-09-06 LAB — URINALYSIS, ROUTINE W REFLEX MICROSCOPIC
Bilirubin Urine: NEGATIVE
Glucose, UA: >=500 mg/dL — AB
Ketones, ur: NEGATIVE mg/dL
Nitrite: NEGATIVE
Protein, ur: 100 mg/dL — AB
RBC / HPF: >50 RBC/hpf (ref 0–5)
Specific Gravity, Urine: 1.011 (ref 1.005–1.030)
WBC, UA: >50 WBC/hpf (ref 0–5)
pH: 5 (ref 5.0–8.0)

## 2022-09-06 LAB — COMPREHENSIVE METABOLIC PANEL
ALT: 17 U/L (ref 0–44)
AST: 20 U/L (ref 15–41)
Albumin: 1.8 g/dL — ABNORMAL LOW (ref 3.5–5.0)
Alkaline Phosphatase: 128 U/L — ABNORMAL HIGH (ref 38–126)
Anion gap: 9 (ref 5–15)
BUN: 76 mg/dL — ABNORMAL HIGH (ref 8–23)
CO2: 24 mmol/L (ref 22–32)
Calcium: 8.3 mg/dL — ABNORMAL LOW (ref 8.9–10.3)
Chloride: 92 mmol/L — ABNORMAL LOW (ref 98–111)
Creatinine, Ser: 4.55 mg/dL — ABNORMAL HIGH (ref 0.44–1.00)
GFR, Estimated: 9 mL/min — ABNORMAL LOW (ref >60)
Glucose, Bld: 496 mg/dL — ABNORMAL HIGH (ref 70–99)
Potassium: 5.4 mmol/L — ABNORMAL HIGH (ref 3.5–5.1)
Sodium: 125 mmol/L — ABNORMAL LOW (ref 135–145)
Total Bilirubin: 0.8 mg/dL (ref 0.3–1.2)
Total Protein: 6.7 g/dL (ref 6.5–8.1)

## 2022-09-06 LAB — CULTURE, BLOOD (ROUTINE X 2): Special Requests: ADEQUATE

## 2022-09-06 LAB — GLUCOSE, CAPILLARY: Glucose-Capillary: 270 mg/dL — ABNORMAL HIGH (ref 70–99)

## 2022-09-06 LAB — LACTIC ACID, PLASMA
Lactic Acid, Venous: 1.5 mmol/L (ref 0.5–1.9)
Lactic Acid, Venous: 1.8 mmol/L (ref 0.5–1.9)

## 2022-09-06 LAB — PROTIME-INR
INR: 2 — ABNORMAL HIGH (ref 0.8–1.2)
Prothrombin Time: 23.2 seconds — ABNORMAL HIGH (ref 11.4–15.2)

## 2022-09-06 MED ORDER — EZETIMIBE 10 MG PO TABS
10.0000 mg | ORAL_TABLET | Freq: Every day | ORAL | Status: DC
  Administered 2022-09-07 – 2022-09-24 (×18): 10 mg via ORAL
  Filled 2022-09-06 (×18): qty 1

## 2022-09-06 MED ORDER — INSULIN ASPART 100 UNIT/ML IJ SOLN
0.0000 [IU] | Freq: Three times a day (TID) | INTRAMUSCULAR | Status: DC
  Administered 2022-09-06: 15 [IU] via SUBCUTANEOUS
  Administered 2022-09-07: 5 [IU] via SUBCUTANEOUS
  Filled 2022-09-06: qty 0.15

## 2022-09-06 MED ORDER — IPRATROPIUM-ALBUTEROL 0.5-2.5 (3) MG/3ML IN SOLN
3.0000 mL | Freq: Once | RESPIRATORY_TRACT | Status: AC
  Administered 2022-09-06: 3 mL via RESPIRATORY_TRACT
  Filled 2022-09-06: qty 3

## 2022-09-06 MED ORDER — ATORVASTATIN CALCIUM 80 MG PO TABS
80.0000 mg | ORAL_TABLET | Freq: Every day | ORAL | Status: DC
  Administered 2022-09-06 – 2022-09-07 (×2): 80 mg via ORAL
  Filled 2022-09-06: qty 2
  Filled 2022-09-06: qty 1

## 2022-09-06 MED ORDER — APIXABAN 2.5 MG PO TABS
2.5000 mg | ORAL_TABLET | Freq: Two times a day (BID) | ORAL | Status: DC
  Administered 2022-09-06 – 2022-09-11 (×12): 2.5 mg via ORAL
  Filled 2022-09-06 (×12): qty 1

## 2022-09-06 MED ORDER — SODIUM CHLORIDE 0.9 % IV BOLUS
500.0000 mL | Freq: Once | INTRAVENOUS | Status: AC
  Administered 2022-09-06: 500 mL via INTRAVENOUS

## 2022-09-06 MED ORDER — SODIUM CHLORIDE 0.9 % IV SOLN: INTRAVENOUS | Status: AC

## 2022-09-06 MED ORDER — INSULIN ASPART 100 UNIT/ML IJ SOLN
8.0000 [IU] | Freq: Once | INTRAMUSCULAR | Status: AC
  Administered 2022-09-06: 8 [IU] via INTRAVENOUS
  Filled 2022-09-06: qty 0.08

## 2022-09-06 MED ORDER — METOPROLOL SUCCINATE ER 50 MG PO TB24
50.0000 mg | ORAL_TABLET | Freq: Every day | ORAL | Status: DC
  Administered 2022-09-06 – 2022-09-07 (×2): 50 mg via ORAL
  Filled 2022-09-06 (×2): qty 1

## 2022-09-06 MED ORDER — STROKE: EARLY STAGES OF RECOVERY BOOK
Freq: Once | Status: AC
  Filled 2022-09-06: qty 1

## 2022-09-06 MED ORDER — SODIUM CHLORIDE 0.9 % IV SOLN
1.0000 g | INTRAVENOUS | Status: AC
  Administered 2022-09-07 – 2022-09-13 (×7): 1 g via INTRAVENOUS
  Filled 2022-09-06 (×9): qty 20

## 2022-09-06 MED ORDER — AMIODARONE HCL 200 MG PO TABS
200.0000 mg | ORAL_TABLET | Freq: Every day | ORAL | Status: DC
  Administered 2022-09-07 – 2022-09-24 (×18): 200 mg via ORAL
  Filled 2022-09-06 (×18): qty 1

## 2022-09-06 MED ORDER — INSULIN ASPART PROT & ASPART (70-30 MIX) 100 UNIT/ML ~~LOC~~ SUSP
5.0000 [IU] | Freq: Two times a day (BID) | SUBCUTANEOUS | Status: DC
  Administered 2022-09-07: 5 [IU] via SUBCUTANEOUS
  Filled 2022-09-06: qty 10

## 2022-09-06 MED ORDER — INSULIN ASPART PROT & ASPART (70-30 MIX) 100 UNIT/ML ~~LOC~~ SUSP
10.0000 [IU] | Freq: Once | SUBCUTANEOUS | Status: AC
  Administered 2022-09-06: 10 [IU] via SUBCUTANEOUS
  Filled 2022-09-06: qty 10

## 2022-09-06 MED ORDER — INSULIN ASPART 100 UNIT/ML IJ SOLN
0.0000 [IU] | Freq: Every day | INTRAMUSCULAR | Status: DC
  Administered 2022-09-06: 3 [IU] via SUBCUTANEOUS
  Filled 2022-09-06: qty 0.05

## 2022-09-06 MED ORDER — GADOBUTROL 1 MMOL/ML IV SOLN
7.0000 mL | Freq: Once | INTRAVENOUS | Status: AC | PRN
  Administered 2022-09-06: 7 mL via INTRAVENOUS

## 2022-09-06 MED ORDER — LORAZEPAM 0.5 MG PO TABS
0.5000 mg | ORAL_TABLET | Freq: Once | ORAL | Status: AC
  Administered 2022-09-06: 0.5 mg via ORAL
  Filled 2022-09-06: qty 1

## 2022-09-06 NOTE — H&P (Addendum)
History and Physical  [name] ZOX:[id number] DOB: 1941-01-03 DOA: 09/06/2022  PCP: [name], MD   Chief Complaint: Weakness and dizziness  HPI: [name] [name] is a 82 y.o. female with medical history significant for atrial fibrillation recently transition to Eliquis, CKD stage III, insulin-dependent type 2 diabetes, heart failure with reduced EF, hypertension, hyperlipidemia being admitted to the hospital with concern for recurrent ESBL E. coli UTI, and acute CVA.  History is provided by the patient, who lives with her husband and tells me that over the last couple weeks she has been feeling quite weak, shaky, has a hard time walking even short distances.  Denies any fevers, or nausea.  She told the ER provider that she is unable to walk with her walker due to dizziness, to me she denies dizziness, but says that she gets shaky, and feels very weak and unable to ambulate.  Says that she drinks a lot of water, but feels like she hardly urinates, this is not a new problem.  ED Course: Evaluation in the ER demonstrates essentially unremarkable vital signs.  Work up shows significant hyperglycemia blood sugar 496, potassium 5.4, sodium 125, creatinine 4.55, WBC 26,000, hemoglobin 8.  Review of previous records indicates her baseline creatinine is probably around 2.9.  Baseline hemoglobin appears to be about 8.4.  Patient states she did not take any of her home medications this morning, including insulin.  She received her home insulin dose, and ER provider has prescribed dose of 8 units insulin IV since blood sugar did not improve.  Also reveals acute CVA on MRI of the brain, as detailed below.  ER provider discussed with neurology on-call who recommends hospitalist admission to [facility].  Review of Systems: Please see HPI for pertinent positives and negatives. A complete 10 system review of systems are otherwise negative.  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   HYPERLIPIDEMIA 03/27/2006   HYPERTENSION 03/27/2006   Intertrochanteric fracture of right hip (HCC) 07/13/2012   LUNG NODULE 09/01/2006   Excision, Bx Benighn   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"   Osteopenia 2004   Dexa 2004 showed Osteopenia, DEXA 03/2007 normal   Osteopenia    Other chronic cystitis with hematuria    Pelvic fracture (HCC) 06/16/2016   S/P fall   Recurrent urinary tract infection    Seeing Urology   RETINOPATHY, BACKGROUND NOS 03/27/2006   Seizures (HCC)    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   Past Surgical History:  Procedure Laterality Date   ANKLE FRACTURE SURGERY Bilateral 08/2015   "right one was in 2 places"   CARDIAC CATHETERIZATION N/A 05/17/2015   Procedure: Left Heart Cath and Coronary Angiography;  Surgeon: Corky Crafts, MD;  Location: [facility] INVASIVE CV LAB;  Service: Cardiovascular;  Laterality: N/A;   CARDIAC CATHETERIZATION N/A 05/17/2015   Procedure: Coronary Balloon Angioplasty;  Surgeon: Corky Crafts, MD;  Location: [facility] INVASIVE CV LAB;  Service: Cardiovascular;  Laterality: N/A;   CARDIOVERSION N/A 08/05/2022   Procedure: CARDIOVERSION;  Surgeon: Chilton Si, MD;  Location: [facility] INVASIVE CV LAB;  Service: Cardiovascular;  Laterality: N/A;   FEMUR IM NAIL Right 07/15/2012   Procedure: INTRAMEDULLARY (IM) NAIL HIP;  Surgeon: Eulas Post, MD;  Location: [facility] OR;  Service: Orthopedics;  Laterality: Right;   FEMUR IM NAIL Left 12/02/2015   Procedure: INTRAMEDULLARY (IM) NAIL FEMORAL;  Surgeon: Eldred Manges, MD;  Location: [facility] OR;  Service: Orthopedics;  Laterality: Left;   FRACTURE SURGERY     INCISION AND DRAINAGE HIP Left 01/03/2016   Procedure: IRRIGATION AND DEBRIDEMENT HIP;  Surgeon: Eldred Manges, MD;  Location: [facility] OR;  Service: Orthopedics;  Laterality: Left;   SHOULDER SURGERY Bilateral    "don't remember which side"    Social History:  reports that she quit smoking about 44 years ago. Her smoking use included cigarettes. She has never used smokeless tobacco. She reports that she does not drink alcohol and does not use drugs.   Allergies  Allergen Reactions   Procaine Hcl Anaphylaxis   Amoxicillin Itching    Family History  Problem Relation Age of Onset   Diabetes Father    Heart attack Father 24   Diabetes Sister    Drug abuse Son    Cancer Neg Hx        no hx of colon or breast cancer     Prior to Admission medications   Medication Sig Start Date End Date Taking? Authorizing Provider  amiodarone (PACERONE) 200 MG tablet Take 1 tablet (200 mg total) by mouth daily. 08/13/22   Zannie Cove, MD  apixaban (ELIQUIS) 2.5 MG TABS tablet Take 1 tablet (2.5 mg total) by mouth 2 (two) times daily. To start likely on Friday 6/14 if recommended by Coumadin clinic after labs done 08/21/22   Chilton Si, MD  atorvastatin (LIPITOR) 80 MG tablet Take 1 tablet (80 mg total) by mouth daily. 08/13/22   [name], MD  ezetimibe (ZETIA) 10 MG tablet Take 1 tablet (10 mg total) by mouth daily. 12/04/21   Chilton Si, MD  furosemide (LASIX) 40 MG tablet Take 1 tablet (40 mg total) by mouth daily as needed for fluid or edema. 07/23/22   Lorin Glass, MD  hydrocortisone 2.5 % cream Apply topically 2 (two) times daily. 08/31/22   Sharlene Dory, DO  insulin NPH-regular Human (NOVOLIN 70/30 RELION) (70-30) 100 UNIT/ML injection 10 units in the morning and 5 units at night. Patient taking differently: Inject 5-23 Units into the skin daily. 07/23/22   Dahal, Melina Schools, MD   metoprolol succinate (TOPROL-XL) 50 MG 24 hr tablet TAKE 1 TABLET BY MOUTH ONCE DAILY . APPOINTMENT REQUIRED FOR FUTURE REFILLS Patient taking differently: Take 50 mg by mouth daily. 06/15/22   Chilton Si, MD  Multiple Vitamins-Minerals (MULTIVITAMIN ADULT PO) Take 1 tablet by mouth daily with breakfast.     [provider]  Nitroglycerin 0.4 % OINT Apply 1 ribbon of ointment anally every 12 hours for anal pain. 08/31/22   Sharlene Dory, DO  VITAMIN D, CHOLECALCIFEROL, PO Take 1 tablet by mouth daily.    [provider]    Physical Exam: BP (!) 137/50   Pulse 63   Temp 97.9 F (36.6 C) (Oral)   Resp 16   SpO2 100%   General:  Alert, oriented, calm, in no acute distress, speaking clearly.  She looks chronically tired, but not in any acute distress,  not septic.  Overall she looks clinically dry. Eyes: EOMI, clear conjuctivae, white sclerea Neck: supple, no masses, trachea mildline  Cardiovascular: RRR, no murmurs or rubs, no peripheral edema  Respiratory: clear to auscultation bilaterally, no wheezes, no crackles  Abdomen: soft, nontender, nondistended, normal bowel tones heard  Skin: dry, no rashes  Musculoskeletal: no joint effusions, normal range of motion  Psychiatric: appropriate affect, normal speech  Neurologic: extraocular muscles intact, clear speech, moving all extremities with intact sensorium          Labs on Admission:  Basic Metabolic Panel: Recent Labs  Lab 09/06/22 0944  NA 125*  K 5.4*  CL 92*  CO2 24  GLUCOSE 496*  BUN 76*  CREATININE 4.55*  CALCIUM 8.3*   Liver Function Tests: Recent Labs  Lab 09/06/22 0944  AST 20  ALT 17  ALKPHOS 128*  BILITOT 0.8  PROT 6.7  ALBUMIN 1.8*   No results for input(s): "LIPASE", "AMYLASE" in the last 168 hours. No results for input(s): "AMMONIA" in the last 168 hours. CBC: Recent Labs  Lab 09/06/22 0944  WBC 26.7*  NEUTROABS 25.4*  HGB 8.0*  HCT 24.9*  MCV 85.9  PLT 495*    Cardiac Enzymes: No results for input(s): "CKTOTAL", "CKMB", "CKMBINDEX", "TROPONINI" in the last 168 hours.  BNP (last 3 results) Recent Labs    07/20/22 1030 08/03/22 1710  BNP 1,142.6* 1,094.9*    ProBNP (last 3 results) No results for input(s): "PROBNP" in the last 8760 hours.  CBG: Recent Labs  Lab 09/06/22 0935 09/06/22 1200  GLUCAP 486* 446*    Radiological Exams on Admission: MR BRAIN WO CONTRAST  Result Date: 09/06/2022 CLINICAL DATA:  Neuro deficit with acute stroke suspected EXAM: MRI HEAD WITHOUT CONTRAST TECHNIQUE: Multiplanar, multiecho pulse sequences of the brain and surrounding structures were obtained without intravenous contrast. 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 Small acute infarct in the postcentral gyrus on the right chronic small vessel ischemia to a moderate degree. No acute hemorrhage, hydrocephalus, or masslike finding Vascular: Normal flow voids. Skull and upper cervical spine: Normal marrow signal. Sinuses/Orbits: Negative. IMPRESSION: 1. 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 2. 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 Electronically Signed   By: Tiburcio Pea M.D.   On: 09/06/2022 11:40   DG Chest Portable 1 View  Result Date: 09/06/2022 CLINICAL DATA:  Pain EXAM: PORTABLE CHEST 1 VIEW COMPARISON:  08/07/2022 FINDINGS: Stable cardiomediastinal contours. Aortic atherosclerosis. Perihilar and bibasilar interstitial opacities. No pleural effusion or pneumothorax. IMPRESSION: Perihilar and bibasilar interstitial opacities, which may reflect edema or atypical infection. Electronically Signed   By: Duanne Guess D.O.   On: 09/06/2022 10:14    Assessment/Plan This is a chronically ill 82 year old  female with a history of insulin-dependent type 2 diabetes, hypertension, hyperlipidemia, atrial fibrillation anticoagulated with Eliquis, CKD stage III, frequent UTIs and history of ESBL E. coli UTI being admitted to the hospital with acute CVA and concern for recurrent ESBL E. coli UTI.  Acute infarct in the postcentral gyrus on the right-this may partially explain her tremors and weakness.  -Inpatient admission to progressive unit at [facility] -N.p.o. until bedside swallow screen -If fails swallow screen, SLP consult -PT/OT evaluation -Neurochecks -Check hemoglobin A1c, and fasting lipid profile -MRA head and neck -2D  echo was just done 1 month ago, will defer to neurology whether this needs to be repeated -ER provider discussed with Dr. Selina Cooley, will consult formally at [facility]  Suspected recurrent ESBL E. coli UTI-given her generalized weakness, significant leukocytosis -Follow urine cultures -Continue empiric IV meropenem  Hyponatremia-this is pseudohyponatremia, which corrects to 131 when her hyperglycemia is taken into account.  Treat hyperglycemia as below.  Insulin-dependent type 2 diabetes, with hyperglycemia-currently very poorly controlled, likely due to combination of acute infection/stroke, and not having taken her insulin today.  She has received NovoLog 70/30 in the ER, as well as a dose of IV insulin. -Diabetic diet when eating -Continue home NovoLog 70/30 5 units twice daily -Moderate dose sliding scale insulin  Atrial fibrillation -Toprol XL -Amiodarone -Eliquis anticoagulation  Hyperlipidemia -Check fasting lipids in the morning -Atorvastatin 80 -Zetia 10  Acute on chronic kidney disease stage III-baseline creatinine appears to be about 2.9.  I suspect her acute kidney injury is related to prerenal azotemia from relative dehydration, as well as suspected acute UTI. -Avoid nephrotoxins -Renally dose medications -Will hydrate gently with normal saline for the  next 24 hours, watching closely for any signs or symptoms of acute heart failure  Heart failure with reduced EF-last echo 08/05/2022 with EF 35% -Currently no clinical evidence of heart failure, in fact patient looks dry on exam -Continue appropriate cardiac medications as noted above  DVT prophylaxis: Eliquis    Code Status: Full Code  Consults called: None  Admission status: The appropriate patient status for this patient is INPATIENT. Inpatient status is judged to be reasonable and necessary in order to provide the required intensity of service to ensure the patient's safety. The patient's presenting symptoms, physical exam findings, and initial radiographic and laboratory data in the context of their chronic comorbidities is felt to place them at high risk for further clinical deterioration. Furthermore, it is not anticipated that the patient will be medically stable for discharge from the hospital within 2 midnights of admission.    I certify that at the point of admission it is my clinical judgment that the patient will require inpatient hospital care spanning beyond 2 midnights from the point of admission due to high intensity of service, high risk for further deterioration and high frequency of surveillance required  Time spent: 59 minutes  Dina Mobley Sharlette Dense MD [facility] Pager [phone]  If 7PM-7AM, please contact night-coverage [email or web address] Password [facility]  09/06/2022, 12:57 PM

## 2022-09-06 NOTE — ED Provider Notes (Signed)
[facility] AT [facility] Provider Note   CSN: [id number] Arrival date & time: 09/06/22  0915     History  Chief Complaint  Patient presents with   Weakness   Dizziness    [name] [name] is a 82 y.o. female.  Pt is a 82 yo female with pmhx significant for left eye blindness, DM2, HLD, HTN, afib (on Eliquis), osteopenia, seizures, CHF, ischemic CM (EF 35-40% in June), and ESBL UTI.  Pt presents from home with weakness and dizziness.  She also c/o hemorrhoid pain.  She saw her doctor on 7/1 for the same hemorrhoid.  She was given a rx nitroglycerin ointment and hydrocortisone crm, but the nitroglycerin cream was too expensive, so she did not get it.  Pt also reports that she has not been able to walk with her walker without getting very dizzy.  She has not had any new falls.        Home Medications Prior to Admission medications   Medication Sig Start Date End Date Taking? Authorizing Provider  amiodarone (PACERONE) 200 MG tablet Take 1 tablet (200 mg total) by mouth daily. 08/13/22   Zannie Cove, MD  apixaban (ELIQUIS) 2.5 MG TABS tablet Take 1 tablet (2.5 mg total) by mouth 2 (two) times daily. To start likely on Friday 6/14 if recommended by Coumadin clinic after labs done 08/21/22   Chilton Si, MD  atorvastatin (LIPITOR) 80 MG tablet Take 1 tablet (80 mg total) by mouth daily. 08/13/22   [name], MD  ezetimibe (ZETIA) 10 MG tablet Take 1 tablet (10 mg total) by mouth daily. 12/04/21   Chilton Si, MD  furosemide (LASIX) 40 MG tablet Take 1 tablet (40 mg total) by mouth daily as needed for fluid or edema. 07/23/22   Lorin Glass, MD  hydrocortisone 2.5 % cream Apply topically 2 (two) times daily. 08/31/22   Sharlene Dory, DO  insulin NPH-regular Human (NOVOLIN 70/30 RELION) (70-30) 100 UNIT/ML injection 10 units in the morning and 5 units at night. Patient taking differently: Inject 5-23 Units into the skin daily. 07/23/22    Dahal, Melina Schools, MD  metoprolol succinate (TOPROL-XL) 50 MG 24 hr tablet TAKE 1 TABLET BY MOUTH ONCE DAILY . APPOINTMENT REQUIRED FOR FUTURE REFILLS Patient taking differently: Take 50 mg by mouth daily. 06/15/22   Chilton Si, MD  Multiple Vitamins-Minerals (MULTIVITAMIN ADULT PO) Take 1 tablet by mouth daily with breakfast.     [provider]  Nitroglycerin 0.4 % OINT Apply 1 ribbon of ointment anally every 12 hours for anal pain. 08/31/22   Sharlene Dory, DO  VITAMIN D, CHOLECALCIFEROL, PO Take 1 tablet by mouth daily.    [provider]      Allergies    Procaine hcl and Amoxicillin    Review of Systems   Review of Systems  Gastrointestinal:        Hemorrhois  Neurological:  Positive for dizziness and weakness.  All other systems reviewed and are negative.   Physical Exam Updated Vital Signs BP (!) 137/50   Pulse 63   Temp 97.9 F (36.6 C) (Oral)   Resp 16   SpO2 100%  Physical Exam Vitals and nursing note reviewed.  Constitutional:      Appearance: Normal appearance.  HENT:     Head: Normocephalic and atraumatic.     Right Ear: External ear normal.     Left Ear: External ear normal.     Nose: Nose  normal.  Eyes:     Comments: Left eye blind  Cardiovascular:     Rate and Rhythm: Normal rate and regular rhythm.     Pulses: Normal pulses.     Heart sounds: Normal heart sounds.  Pulmonary:     Effort: Pulmonary effort is normal.     Breath sounds: Normal breath sounds.  Abdominal:     General: Abdomen is flat. Bowel sounds are normal.     Palpations: Abdomen is soft.  Genitourinary:    Comments: Hemorrhoid noted.  No thrombosis. Musculoskeletal:        General: Normal range of motion.     Cervical back: Normal range of motion and neck supple.  Skin:    General: Skin is warm.     Capillary Refill: Capillary refill takes less than 2 seconds.  Neurological:     General: No focal deficit present.     Mental Status: She is alert and  oriented to person, place, and time.  Psychiatric:        Mood and Affect: Mood normal.        Behavior: Behavior normal.     ED Results / Procedures / Treatments   Labs (all labs ordered are listed, but only abnormal results are displayed) Labs Reviewed  CBC WITH DIFFERENTIAL/PLATELET - Abnormal; Notable for the following components:      Result Value   WBC 26.7 (*)    RBC 2.90 (*)    Hemoglobin 8.0 (*)    HCT 24.9 (*)    RDW 17.1 (*)    Platelets 495 (*)    Neutro Abs 25.4 (*)    Lymphs Abs 0.5 (*)    All other components within normal limits  COMPREHENSIVE METABOLIC PANEL - Abnormal; Notable for the following components:   Sodium 125 (*)    Potassium 5.4 (*)    Chloride 92 (*)    Glucose, Bld 496 (*)    BUN 76 (*)    Creatinine, Ser 4.55 (*)    Calcium 8.3 (*)    Albumin 1.8 (*)    Alkaline Phosphatase 128 (*)    GFR, Estimated 9 (*)    All other components within normal limits  URINALYSIS, ROUTINE W REFLEX MICROSCOPIC - Abnormal; Notable for the following components:   APPearance TURBID (*)    Glucose, UA >=500 (*)    Hgb urine dipstick LARGE (*)    Protein, ur 100 (*)    Leukocytes,Ua LARGE (*)    Bacteria, UA MANY (*)    All other components within normal limits  PROTIME-INR - Abnormal; Notable for the following components:   Prothrombin Time 23.2 (*)    INR 2.0 (*)    All other components within normal limits  CBG MONITORING, ED - Abnormal; Notable for the following components:   Glucose-Capillary 486 (*)    All other components within normal limits  CBG MONITORING, ED - Abnormal; Notable for the following components:   Glucose-Capillary 446 (*)    All other components within normal limits  SARS CORONAVIRUS 2 BY RT PCR  CULTURE, BLOOD (ROUTINE X 2)  CULTURE, BLOOD (ROUTINE X 2)  URINE CULTURE  LACTIC ACID, PLASMA  HEMOGLOBIN A1C  LACTIC ACID, PLASMA    EKG EKG Interpretation Date/Time:  Sunday September 06 2022 09:27:31 EDT Ventricular Rate:  65 PR  Interval:  129 QRS Duration:  100 QT Interval:  466 QTC Calculation: 485 R Axis:   -39  Text Interpretation: Sinus or ectopic atrial  rhythm Left axis deviation Probable anterior infarct, age indeterminate No significant change since last tracing Confirmed by Jacalyn Lefevre [phone]) on 09/06/2022 9:46:01 AM  Radiology MR BRAIN WO CONTRAST  Result Date: 09/06/2022 CLINICAL DATA:  Neuro deficit with acute stroke suspected EXAM: MRI HEAD WITHOUT CONTRAST TECHNIQUE: Multiplanar, multiecho pulse sequences of the brain and surrounding structures were obtained without intravenous contrast. 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 Small acute infarct in the postcentral gyrus on the right chronic small vessel ischemia to a moderate degree. No acute hemorrhage, hydrocephalus, or masslike finding Vascular: Normal flow voids. Skull and upper cervical spine: Normal marrow signal. Sinuses/Orbits: Negative. IMPRESSION: 1. 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 2. 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 Electronically Signed   By: Tiburcio Pea M.D.   On: 09/06/2022 11:40   DG Chest Portable 1 View  Result Date: 09/06/2022 CLINICAL DATA:  Pain EXAM: PORTABLE CHEST 1 VIEW COMPARISON:  08/07/2022 FINDINGS: Stable cardiomediastinal contours. Aortic atherosclerosis. Perihilar and bibasilar interstitial opacities. No pleural effusion or pneumothorax. IMPRESSION: Perihilar and bibasilar interstitial opacities, which may reflect edema or atypical infection. Electronically Signed   By: Duanne Guess D.O.   On: 09/06/2022 10:14    Procedures Procedures    Medications Ordered in ED Medications  meropenem (MERREM) 1 g in sodium chloride 0.9 % 100 mL IVPB (has no  administration in time range)  amiodarone (PACERONE) tablet 200 mg (has no administration in time range)  atorvastatin (LIPITOR) tablet 80 mg (has no administration in time range)  ezetimibe (ZETIA) tablet 10 mg (has no administration in time range)  metoprolol succinate (TOPROL-XL) 24 hr tablet 50 mg (has no administration in time range)  insulin aspart protamine- aspart (NOVOLOG MIX 70/30) injection 5 Units (has no administration in time range)  apixaban (ELIQUIS) tablet 2.5 mg (has no administration in time range)  insulin aspart (novoLOG) injection 0-15 Units (has no administration in time range)  insulin aspart (novoLOG) injection 0-5 Units (has no administration in time range)   stroke: early stages of recovery book (has no administration in time range)  sodium chloride 0.9 % bolus 500 mL (0 mLs Intravenous Stopped 09/06/22 1031)  ipratropium-albuterol (DUONEB) 0.5-2.5 (3) MG/3ML nebulizer solution 3 mL (3 mLs Nebulization Given 09/06/22 1014)  insulin aspart protamine- aspart (NOVOLOG MIX 70/30) injection 10 Units (10 Units Subcutaneous Given 09/06/22 1030)  insulin aspart (novoLOG) injection 8 Units (8 Units Intravenous Given 09/06/22 1257)    ED Course/ Medical Decision Making/ A&P                             Medical Decision Making Amount and/or Complexity of Data Reviewed Labs: ordered. Radiology: ordered.  Risk Prescription drug management.   This patient presents to the ED for concern of weakness, this involves an extensive number of treatment options, and is a complaint that carries with it a high risk of complications and morbidity.  The differential diagnosis includes infection, electrolyte abn, anemia, cva   Co morbidities that complicate the patient evaluation   left eye blindness, DM2, HLD, HTN, afib (on Eliquis), osteopenia, seizures, CHF, ischemic CM (EF 35-40% in June), and ESBL UTI   Additional history obtained:  Additional history obtained from epic chart  review External  records from outside source obtained and reviewed including EMS report   Lab Tests:  I Ordered, and personally interpreted labs.  The pertinent results include:  cbc with wbc elevated at 26.7, hgb 8.0 (chronic), plt elevated at 495; cmp with na low at 125, k elevated at 5.4, glucose elevated at 496, bun elevated at 76 and cr elevated at 4.55 (bun 49 and cr 3.11 on 6/24); covid neg   Imaging Studies ordered:  I ordered imaging studies including CXR, MRI brain CXR: I independently visualized and interpreted imaging which showed  Perihilar and bibasilar interstitial opacities, which may reflect  edema or atypical infection.  MRI brain: Small acute infarct in the postcentral gyrus on the right.  2. Large area of left temporal encephalomalacia correlating with  history of remote herpes encephalitis.   I agree with the radiologist interpretation   Cardiac Monitoring:  The patient was maintained on a cardiac monitor.  I personally viewed and interpreted the cardiac monitored which showed an underlying rhythm of: nsr   Medicines ordered and prescription drug management:  I ordered medication including insulin  for hyperglycemia; zosyn for esbl uti Reevaluation of the patient after these medicines showed that the patient improved I have reviewed the patients home medicines and have made adjustments as needed   Test Considered:  mri   Critical Interventions:  CVA:  pt did have afib with rvr about a month ago.  She did have a cardioversion on 6/5.  She said she's been compliant with her Eliquis.  However, she did not take any meds this am. Hyperglycemia:  BS elevated and stayed elevated after home dose of insulin.  She is given additional insulin and 500 cc NS UTI:  hx ESBL UTIs. Pharm consulted for abx and they ordered Merrem.   Consultations Obtained:  I requested consultation with the neurologist (Dr. Selina Cooley),  and discussed lab and imaging findings as well as  pertinent plan - she recommends medicine admission to [facility] Pt d/w Dr. Kirby Crigler ([facility]) who will admit.   Problem List / ED Course:  Hyperglycemia:  pt has not taken her home insulin this am, so her usual dose was ordered.  Hgb A1c ordered. Acute on chronic kidney injury   Reevaluation:  After the interventions noted above, I reevaluated the patient and found that they have :improved   Social Determinants of Health:  Lives at home   Dispostion:  After consideration of the diagnostic results and the patients response to treatment, I feel that the patent would benefit from admission.    CRITICAL CARE Performed by: Jacalyn Lefevre   Total critical care time: 30 minutes  Critical care time was exclusive of separately billable procedures and treating other patients.  Critical care was necessary to treat or prevent imminent or life-threatening deterioration.  Critical care was time spent personally by me on the following activities: development of treatment plan with patient and/or surrogate as well as nursing, discussions with consultants, evaluation of patient's response to treatment, examination of patient, obtaining history from patient or surrogate, ordering and performing treatments and interventions, ordering and review of laboratory studies, ordering and review of radiographic studies, pulse oximetry and re-evaluation of patient's condition.         Final Clinical Impression(s) / ED Diagnoses Final diagnoses:  Poorly controlled diabetes mellitus (HCC)  Cerebrovascular accident (CVA), unspecified mechanism (HCC)  Acute cystitis with hematuria    Rx / DC Orders ED Discharge Orders     None  Jacalyn Lefevre, MD 09/06/22 1301

## 2022-09-06 NOTE — Progress Notes (Signed)
PT Cancellation Note  Patient Details Name: [name] [name] MRN: [id number] DOB: [birth year]   Cancelled Treatment:    Reason Eval/Treat Not Completed: Patient at procedure or test/unavailable MRI, see as schedule allows   [facility] 09/06/2022, 3:43 PM

## 2022-09-06 NOTE — Progress Notes (Signed)
Pharmacy Antibiotic Note  [name] [name] is a 82 y.o. female admitted on 09/06/2022 with UTI.  Pharmacy has been consulted for meropenem dosing.  Plan: Meropenem 1 gm IV q24 for CrCl < 10 ml/min F/u renal fxn & UCx    Temp (24hrs), Avg:97.9 F (36.6 C), Min:97.9 F (36.6 C), Max:97.9 F (36.6 C)  Recent Labs  Lab 09/06/22 0944 09/06/22 1012  WBC 26.7*  --   CREATININE 4.55*  --   LATICACIDVEN  --  1.5    Estimated Creatinine Clearance: 9.9 mL/min (A) (by C-G formula based on SCr of 4.55 mg/dL (H)).    Allergies  Allergen Reactions   Procaine Hcl Anaphylaxis   Amoxicillin Itching    Antimicrobials this admission: 7/7 meropenem>>  Dose adjustments this admission:  Microbiology results: 7/7 UCx:  7/7 BCx2:   PTA 07/20/22 Cath UCx: 81 K ESBL Kleb pneumo, R to all x imipenem   Thank you for allowing pharmacy to be a part of this patient's care.  Herby Abraham, Pharm.D Use secure chat for questions 09/06/2022 1:02 PM

## 2022-09-06 NOTE — ED Triage Notes (Signed)
EMS reports from home called out for weakness and dizziness x 3 days. C/o hemorrhoid pain. Seen 6 days ago for same.  BP 146/78 HR 66 RR 18 Sp02 90 RA CBG 550

## 2022-09-06 NOTE — ED Notes (Signed)
Dr. Particia Nearing made aware of patient's blood sugar of 446.

## 2022-09-07 ENCOUNTER — Telehealth: Payer: Self-pay

## 2022-09-07 ENCOUNTER — Encounter (HOSPITAL_COMMUNITY): Payer: Self-pay | Admitting: Internal Medicine

## 2022-09-07 ENCOUNTER — Inpatient Hospital Stay (HOSPITAL_COMMUNITY): Payer: Medicare Other

## 2022-09-07 DIAGNOSIS — I639 Cerebral infarction, unspecified: Secondary | ICD-10-CM | POA: Diagnosis not present

## 2022-09-07 DIAGNOSIS — N3001 Acute cystitis with hematuria: Secondary | ICD-10-CM

## 2022-09-07 DIAGNOSIS — E1165 Type 2 diabetes mellitus with hyperglycemia: Secondary | ICD-10-CM | POA: Diagnosis not present

## 2022-09-07 DIAGNOSIS — R7881 Bacteremia: Secondary | ICD-10-CM | POA: Diagnosis present

## 2022-09-07 LAB — PROCALCITONIN: Procalcitonin: 1.98 ng/mL

## 2022-09-07 LAB — BLOOD CULTURE ID PANEL (REFLEXED) - BCID2

## 2022-09-07 LAB — BASIC METABOLIC PANEL
Anion gap: 12 (ref 5–15)
BUN: 78 mg/dL — ABNORMAL HIGH (ref 8–23)
CO2: 23 mmol/L (ref 22–32)
Calcium: 8.2 mg/dL — ABNORMAL LOW (ref 8.9–10.3)
Chloride: 93 mmol/L — ABNORMAL LOW (ref 98–111)
Creatinine, Ser: 4.61 mg/dL — ABNORMAL HIGH (ref 0.44–1.00)
GFR, Estimated: 9 mL/min — ABNORMAL LOW (ref >60)
Glucose, Bld: 236 mg/dL — ABNORMAL HIGH (ref 70–99)
Potassium: 5.4 mmol/L — ABNORMAL HIGH (ref 3.5–5.1)
Sodium: 128 mmol/L — ABNORMAL LOW (ref 135–145)

## 2022-09-07 LAB — CBC
HCT: 21.6 % — ABNORMAL LOW (ref 36.0–46.0)
Hemoglobin: 7.1 g/dL — ABNORMAL LOW (ref 12.0–15.0)
MCH: 28.2 pg (ref 26.0–34.0)
MCHC: 32.9 g/dL (ref 30.0–36.0)
MCV: 85.7 fL (ref 80.0–100.0)
Platelets: 448 10*3/uL — ABNORMAL HIGH (ref 150–400)
RBC: 2.52 MIL/uL — ABNORMAL LOW (ref 3.87–5.11)
RDW: 17 % — ABNORMAL HIGH (ref 11.5–15.5)
WBC: 23.7 10*3/uL — ABNORMAL HIGH (ref 4.0–10.5)
nRBC: 0 % (ref 0.0–0.2)

## 2022-09-07 LAB — BPAM RBC

## 2022-09-07 LAB — CULTURE, BLOOD (ROUTINE X 2): Special Requests: ADEQUATE

## 2022-09-07 LAB — GLUCOSE, CAPILLARY
Glucose-Capillary: 242 mg/dL — ABNORMAL HIGH (ref 70–99)
Glucose-Capillary: 202 mg/dL — ABNORMAL HIGH (ref 70–99)
Glucose-Capillary: 185 mg/dL — ABNORMAL HIGH (ref 70–99)

## 2022-09-07 LAB — LIPID PANEL
Cholesterol: 35 mg/dL (ref 0–200)
HDL: <10 mg/dL — ABNORMAL LOW (ref >40)
Triglycerides: 65 mg/dL (ref <150)
VLDL: 13 mg/dL (ref 0–40)

## 2022-09-07 LAB — OCCULT BLOOD X 1 CARD TO LAB, STOOL: Fecal Occult Bld: POSITIVE — AB

## 2022-09-07 LAB — VITAMIN B12: Vitamin B-12: 1280 pg/mL — ABNORMAL HIGH (ref 180–914)

## 2022-09-07 LAB — URINE CULTURE

## 2022-09-07 LAB — PREPARE RBC (CROSSMATCH)

## 2022-09-07 LAB — TYPE AND SCREEN

## 2022-09-07 LAB — HEMOGLOBIN A1C
Hgb A1c MFr Bld: 9.7 % — ABNORMAL HIGH (ref 4.8–5.6)
Mean Plasma Glucose: 232 mg/dL

## 2022-09-07 LAB — FOLATE: Folate: 11.6 ng/mL (ref >5.9)

## 2022-09-07 MED ORDER — ORAL CARE MOUTH RINSE: 15.0000 mL | OROMUCOSAL | Status: DC | PRN

## 2022-09-07 MED ORDER — SODIUM CHLORIDE 0.9% IV SOLUTION: Freq: Once | INTRAVENOUS | Status: AC

## 2022-09-07 MED ORDER — ATORVASTATIN CALCIUM 40 MG PO TABS
40.0000 mg | ORAL_TABLET | Freq: Every day | ORAL | Status: DC
  Administered 2022-09-08 – 2022-09-24 (×17): 40 mg via ORAL
  Filled 2022-09-07 (×17): qty 1

## 2022-09-07 MED ORDER — SODIUM CHLORIDE 0.9 % IV SOLN: INTRAVENOUS | Status: AC

## 2022-09-07 MED ORDER — INSULIN ASPART PROT & ASPART (70-30 MIX) 100 UNIT/ML ~~LOC~~ SUSP
10.0000 [IU] | Freq: Two times a day (BID) | SUBCUTANEOUS | Status: DC
  Administered 2022-09-07 – 2022-09-08 (×2): 10 [IU] via SUBCUTANEOUS
  Filled 2022-09-07: qty 10

## 2022-09-07 MED ORDER — INSULIN ASPART 100 UNIT/ML IJ SOLN
0.0000 [IU] | Freq: Three times a day (TID) | INTRAMUSCULAR | Status: DC
  Administered 2022-09-07: 3 [IU] via SUBCUTANEOUS
  Administered 2022-09-07 – 2022-09-08 (×2): 2 [IU] via SUBCUTANEOUS
  Administered 2022-09-08: 3 [IU] via SUBCUTANEOUS
  Administered 2022-09-08: 2 [IU] via SUBCUTANEOUS
  Administered 2022-09-09: 5 [IU] via SUBCUTANEOUS

## 2022-09-07 MED ORDER — INSULIN ASPART PROT & ASPART (70-30 MIX) 100 UNIT/ML ~~LOC~~ SUSP
5.0000 [IU] | Freq: Once | SUBCUTANEOUS | Status: AC
  Administered 2022-09-07: 5 [IU] via SUBCUTANEOUS
  Filled 2022-09-07: qty 10

## 2022-09-07 MED ORDER — WITCH HAZEL-GLYCERIN EX PADS
MEDICATED_PAD | Freq: Three times a day (TID) | CUTANEOUS | Status: DC
  Administered 2022-09-10 (×2): 1 via TOPICAL
  Filled 2022-09-07 (×3): qty 100

## 2022-09-07 MED ORDER — SODIUM ZIRCONIUM CYCLOSILICATE 10 G PO PACK
10.0000 g | PACK | Freq: Once | ORAL | Status: AC
  Administered 2022-09-07: 10 g via ORAL
  Filled 2022-09-07: qty 1

## 2022-09-07 MED ORDER — HYDROCORTISONE 1 % EX CREA
TOPICAL_CREAM | Freq: Three times a day (TID) | CUTANEOUS | Status: DC
  Administered 2022-09-10 – 2022-09-12 (×3): 1 via TOPICAL
  Filled 2022-09-07 (×3): qty 28

## 2022-09-07 NOTE — Consult Note (Signed)
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 female with PMH significant for afibb on eliquis, DM2, CKD3, HTN, HLD who presents with generalized weakness and hemorrhoids pain. Reports dizzy and generalized weakness over the last couple of weeks. Shaky when she is walking.  Workup with hyperglycemia with glucose of 496, leukocytosis with WBC count of 26K.   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 Neurology consulted for further evaluation. She is also suspected to have recurrent ESBL UTI and has AKI on CKD.  On further questioning, reports ben having trouble with walking over the last 2 months. Gradually declined. Been mostly in the chair but was able to get out and yesterday, sat up at the edge and could not stand up. Denies any numbness, reports mild pain in her legs and knees.  LKW: 08/23/22 mRS: 2 tNKASE: not offered, symptoms not explained by stroke, outside window Thrombectomy: not offered, low suspicion for LVO. NIHSS components Score: Comment  1a Level of Conscious 0[x]  1[]  2[]  3[]      1b LOC Questions 0[x]  1[]  2[]       1c LOC Commands 0[x]  1[]  2[]       2 Best Gaze 0[x]  1[]  2[]       3 Visual 0[x]  1[]  2[]  3[]      4 Facial Palsy 0[x]  1[]  2[]  3[]      5a Motor Arm - left 0[x]  1[]  2[]  3[]  4[]  UN[]    5b Motor Arm - Right 0[x]  1[]  2[]  3[]  4[]  UN[]    6a Motor Leg - Left 0[x]  1[]  2[]  3[]  4[]  UN[]    6b Motor Leg - Right 0[x]  1[]  2[]  3[]  4[]  UN[]    7 Limb Ataxia 0[x]  1[]  2[]  3[]  UN[]     8 Sensory 0[x]  1[]  2[]  UN[]      9 Best Language 0[x]  1[]  2[]  3[]      10 Dysarthria 0[x]  1[]  2[]  UN[]      11 Extinct. and Inattention 0[x]  1[]  2[]       TOTAL: 0       ROS    Constitutional Denies weight loss, fever and chills.   HEENT Denies changes in vision and hearing.   Respiratory Denies SOB and cough.   CV Denies palpitations and CP   GI Denies abdominal pain, nausea, vomiting and diarrhea.   GU Denies dysuria and urinary frequency.   MSK + myalgia and joint pain.   Skin Denies rash and pruritus.   Neurological Denies headache and syncope.   Psychiatric Denies recent changes in mood. Denies anxiety and depression.    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   HYPERLIPIDEMIA 03/27/2006   HYPERTENSION 03/27/2006   Intertrochanteric fracture of right hip (HCC) 07/13/2012   LUNG NODULE 09/01/2006   Excision, Bx Benighn   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"   Osteopenia 2004   Dexa 2004 showed Osteopenia, DEXA 03/2007 normal   Osteopenia    Other chronic cystitis with hematuria    Pelvic fracture (HCC) 06/16/2016   S/P fall   Recurrent urinary tract infection    Seeing Urology   RETINOPATHY, BACKGROUND NOS 03/27/2006   Seizures (HCC)    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   Past Surgical History:  Procedure Laterality Date   ANKLE FRACTURE SURGERY Bilateral 08/2015   "right one was in 2 places"   CARDIAC CATHETERIZATION N/A 05/17/2015   Procedure: Left Heart Cath and Coronary Angiography;  Surgeon: Corky Crafts, MD;  Location: [facility] INVASIVE CV LAB;  Service: Cardiovascular;  Laterality: N/A;   CARDIAC CATHETERIZATION N/A 05/17/2015   Procedure: Coronary Balloon  Angioplasty;  Surgeon: Corky Crafts, MD;  Location: [facility] INVASIVE CV LAB;  Service: Cardiovascular;  Laterality: N/A;   CARDIOVERSION N/A 08/05/2022   Procedure: CARDIOVERSION;  Surgeon: Chilton Si, MD;  Location: [facility] INVASIVE CV LAB;  Service: Cardiovascular;  Laterality: N/A;   FEMUR IM NAIL Right 07/15/2012   Procedure: INTRAMEDULLARY (IM) NAIL HIP;  Surgeon: Eulas Post, MD;  Location: [facility] OR;  Service: Orthopedics;  Laterality: Right;   FEMUR IM NAIL Left 12/02/2015   Procedure: INTRAMEDULLARY (IM) NAIL FEMORAL;  Surgeon: Eldred Manges, MD;  Location: [facility] OR;  Service: Orthopedics;  Laterality: Left;   FRACTURE SURGERY     INCISION AND DRAINAGE HIP Left 01/03/2016   Procedure: IRRIGATION AND DEBRIDEMENT HIP;  Surgeon: Eldred Manges, MD;  Location: [facility] OR;  Service: Orthopedics;  Laterality: Left;   SHOULDER SURGERY Bilateral    "don't remember which side"   Family History  Problem Relation Age of Onset   Diabetes Father    Heart attack Father 51   Diabetes Sister    Drug abuse Son    Cancer Neg Hx        no hx of colon or breast cancer   Social History   Socioeconomic History   Marital status: Married    Spouse name: [name]   Number of children: 1   Years of education: College   Highest education level: Not on file  Occupational History   Occupation: retired     Associate Professor: RETIRED  Tobacco Use   Smoking status: Former    Types: Cigarettes    Quit date: 03/02/1978    Years since quitting: 44.5   Smokeless tobacco: Never  Vaping Use   Vaping Use: Never used  Substance and Sexual Activity   Alcohol use: No    Alcohol/week: 0.0 standard drinks of alcohol   Drug use: No   Sexual activity: Not on file  Other Topics Concern   Not on file  Social History Narrative   Patient lives at home spouse. Uses a walker consistently.   Moved from [place] 2004   1 son, problems w/ drugs, passed away 03-12-2016-- OD       Social Determinants of Health   Financial Resource Strain: Low  Risk  (08/22/2020)   Overall Financial Resource Strain (CARDIA)    Difficulty of Paying Living Expenses: Not hard at all  Food Insecurity: No Food Insecurity (09/06/2022)   Hunger Vital Sign    Worried About  Running Out of Food in the Last Year: Never true    Ran Out of Food in the Last Year: Never true  Transportation Needs: No Transportation Needs (09/06/2022)   PRAPARE - Administrator, Civil Service (Medical): No    Lack of Transportation (Non-Medical): No  Physical Activity: Inactive (08/22/2020)   Exercise Vital Sign    Days of Exercise per Week: 0 days    Minutes of Exercise per Session: 0 min  Stress: No Stress Concern Present (08/22/2020)   [employer] of Occupational Health - Occupational Stress Questionnaire    Feeling of Stress : Not at all  Social Connections: Moderately Integrated (08/22/2020)   Social Connection and Isolation Panel [NHANES]    Frequency of Communication with Friends and Family: More than three times a week    Frequency of Social Gatherings with Friends and Family: More than three times a week    Attends Religious Services: More than 4 times per year    Active Member of [employer] or Organizations: No    Attends Banker Meetings: Never    Marital Status: Married   Allergies  Allergen Reactions   Procaine Hcl Anaphylaxis   Amoxicillin Itching    Medications   Medications Prior to Admission  Medication Sig Dispense Refill Last Dose   alendronate (FOSAMAX) 35 MG tablet Take 35 mg by mouth every 7 (seven) days. Take with a full glass of water on an empty stomach.   Past Week   amiodarone (PACERONE) 200 MG tablet Take 1 tablet (200 mg total) by mouth daily. 30 tablet 0 09/05/2022   apixaban (ELIQUIS) 2.5 MG TABS tablet Take 1 tablet (2.5 mg total) by mouth 2 (two) times daily. To start likely on Friday 6/14 if recommended by Coumadin clinic after labs done 180 tablet 3 09/05/2022 at 0800   atorvastatin (LIPITOR) 80 MG tablet Take 1 tablet  (80 mg total) by mouth daily. 90 tablet 1 09/05/2022   ezetimibe (ZETIA) 10 MG tablet Take 1 tablet (10 mg total) by mouth daily. 90 tablet 2 09/05/2022   hydrocortisone 2.5 % cream Apply topically 2 (two) times daily. 30 g 0 09/05/2022   insulin NPH-regular Human (NOVOLIN 70/30 RELION) (70-30) 100 UNIT/ML injection 10 units in the morning and 5 units at night. (Patient taking differently: Inject 5-23 Units into the skin daily.)   09/05/2022   metoprolol succinate (TOPROL-XL) 50 MG 24 hr tablet TAKE 1 TABLET BY MOUTH ONCE DAILY . APPOINTMENT REQUIRED FOR FUTURE REFILLS (Patient taking differently: Take 50 mg by mouth daily.) 90 tablet 0 09/05/2022   Multiple Vitamins-Minerals (MULTIVITAMIN ADULT PO) Take 1 tablet by mouth daily with breakfast.    09/05/2022   VITAMIN D, CHOLECALCIFEROL, PO Take 1 tablet by mouth daily.   09/05/2022   furosemide (LASIX) 40 MG tablet Take 1 tablet (40 mg total) by mouth daily as needed for fluid or edema. (Patient not taking: Reported on 09/06/2022)   Not Taking   Nitroglycerin 0.4 % OINT Apply 1 ribbon of ointment anally every 12 hours for anal pain. (Patient not taking: Reported on 09/06/2022) 30 g 0 Not Taking     Vitals   Vitals:   09/06/22 1830 09/06/22 2033 09/06/22 2035 09/07/22 0050  BP: (!) 117/46 (!) 114/53  (!) 119/46  Pulse: (!) 59 (!) 57  (!) 56  Resp: 18 18  20   Temp:  98 F (36.7 C)  98.2 F (36.8 C)  TempSrc:  Oral  Oral  SpO2: 100% 98%  100%  Weight:   76 kg   Height:   5\' 9"  (1.753 m)      Body mass index is 24.74 kg/m.  Physical Exam   General: Laying comfortably in bed; in no acute distress.  HENT: Normal oropharynx and mucosa. Normal external appearance of ears and nose.  Neck: Supple, no pain or tenderness  CV: No JVD. No peripheral edema.  Pulmonary: Symmetric Chest rise. Normal respiratory effort.  Abdomen: Soft to touch, non-tender.  Ext: No cyanosis, edema, or deformity  Skin: No rash. Normal palpation of skin.   Musculoskeletal: Normal  digits and nails by inspection. No clubbing.   Neurologic Examination  Mental status/Cognition: Alert, oriented to self, place, month and year, good attention.  Speech/language: Fluent, comprehension intact, object naming intact, repetition intact.  Cranial nerves:   CN II Pupils equal and reactive to light, no VF deficits    CN III,IV,VI EOM intact, no gaze preference or deviation, no nystagmus    CN V normal sensation in V1, V2, and V3 segments bilaterally    CN VII no asymmetry, no nasolabial fold flattening    CN VIII normal hearing to speech    CN IX & X normal palatal elevation, no uvular deviation    CN XI 5/5 head turn and 5/5 shoulder shrug bilaterally    CN XII midline tongue protrusion    Motor:  Muscle bulk: normal, tone normal, pronator drift none tremor none Mvmt Root Nerve  Muscle Right Left Comments  SA C5/6 Ax Deltoid 5 5   EF C5/6 Mc Biceps 5 5   EE C6/7/8 Rad Triceps 5 5   WF C6/7 Med FCR     WE C7/8 PIN ECU     F Ab C8/T1 U ADM/FDI 5 5   HF L1/2/3 Fem Illopsoas 4+ 4+   KE L2/3/4 Fem Quad 5 5   DF L4/5 D Peron Tib Ant 5 5   PF S1/2 Tibial Grc/Sol 5 5    Reflexes:  Right Left Comments  Pectoralis      Biceps (C5/6) 1 1   Brachioradialis (C5/6) 2 2    Triceps (C6/7) 1 1    Patellar (L3/4) 2 2    Achilles (S1) 0 0    Hoffman      Plantar     Jaw jerk    Sensation:  Light touch Intact throughout   Pin prick    Temperature    Vibration Absent in BL feet.  Proprioception    Coordination/Complex Motor:  - Finger to Nose intact BL - Heel to shin intact BL - Rapid alternating movement are slowed. - Gait: deferred for patient safety.  Labs   CBC:  Recent Labs  Lab 09/06/22 0944  WBC 26.7*  NEUTROABS 25.4*  HGB 8.0*  HCT 24.9*  MCV 85.9  PLT 495*    Basic Metabolic Panel:  Lab Results  Component Value Date   NA 125 (L) 09/06/2022   K 5.4 (H) 09/06/2022   CO2 24 09/06/2022   GLUCOSE 496 (H) 09/06/2022   BUN 76 (H) 09/06/2022    CREATININE 4.55 (H) 09/06/2022   CALCIUM 8.3 (L) 09/06/2022   GFRNONAA 9 (L) 09/06/2022   GFRAA 26 (L) 07/20/2018   Lipid Panel:  Lab Results  Component Value Date   LDLCALC 96 07/07/2021   HgbA1c:  Lab Results  Component Value Date   HGBA1C 9.0 (H) 05/05/2022   Urine Drug Screen:  Component Value Date/Time   LABOPIA NONE DETECTED 11/18/2012 1115   COCAINSCRNUR NONE DETECTED 11/18/2012 1115   LABBENZ NONE DETECTED 11/18/2012 1115   AMPHETMU NONE DETECTED 11/18/2012 1115   THCU NONE DETECTED 11/18/2012 1115   LABBARB NONE DETECTED 11/18/2012 1115    Alcohol Level     Component Value Date/Time   ETH <11 11/18/2012 1025    MR Angio head without contrast and MR angio neck with contrast(Personally reviewed): No LVO  MRI Brain(Personally reviewed): Small punctate R parietal lobe infarct  Impression   [name] [name] is a 82 y.o. female with PMH significant for afibb on eliquis, DM2, CKD3, HTN, HLD who presents with generalized weakness and hemorrhoids pain. Reports dizzy and generalized weakness over the last couple of weeks. Shaky when she is walking.  She is currently admitted with small R parietal lobe punctate infarct, AKI on CKD, UTI amd hyperglycemia.  Neuro exam with mild BL hip flexion weakness along with absent proprioception and vibratory sensation in BL feet. I suspect that her difficulty with walking is probably due to progressive diabetic neuropathy in the setting of prolonged history of diabetes that seems to be poorly controlled. However, will evaluate for other causes of peripheral neuropathy.  As for the noted small punctate R parietal lobe stroke, I think this is incidental and does not explain her symptoms. The stroke does seem embolic and there is a case to be made if this is failure of eliquis. Will have stroke team evaluate her to answer that question tomorrow.  Recommendations  - Frequent Neuro checks per stroke unit protocol - no need to TTE -  Recommend obtaining Lipid panel with LDL - Please start statin if LDL > 70 - Recommend HbA1c to evaluate for diabetes and how well it is controlled. - continue home Eliquis. - SBP goal - aim for gradual normotension. - Recommend Telemetry monitoring for arrythmia - Recommend bedside swallow screen prior to PO intake. - Stroke education booklet - Recommend PT/OT/SLP consult - B12, folate, B6, SPEP. - follow up with neurology outpatient.  ______________________________________________________________________   Thank you for the opportunity to take part in the care of this patient. If you have any further questions, please contact the neurology consultation attending.  Signed,  Erick Blinks [facility] _ _ _   _ __   _ __ _ _  __ __   _ __   __ _

## 2022-09-07 NOTE — Progress Notes (Signed)
STROKE TEAM PROGRESS NOTE   SUBJECTIVE (INTERVAL HISTORY) No family is at the bedside.  Overall her condition is stable.  Patient eyes open, AAO x 3, no aphasia, follows simple commands, no focal neuro deficit.  Still complaining of generalized weakness.  Found to have UTI, AKI on CKD, leukocytosis, anemia, uncontrolled diabetes, cardiomyopathy.  MRI showed right postcentral gyrus tiny infarct, likely incidental finding.   OBJECTIVE Temp:  [97.3 F (36.3 C)-98.6 F (37 C)] 97.6 F (36.4 C) (07/08 1645) Pulse Rate:  [53-60] 55 (07/08 1645) Cardiac Rhythm: Heart block (07/08 0751) Resp:  [15-21] 20 (07/08 1645) BP: (114-131)/(43-54) 126/43 (07/08 1645) SpO2:  [91 %-100 %] 95 % (07/08 1645) Weight:  [76 kg] 76 kg (07/07 2035)  Recent Labs  Lab 09/06/22 1718 09/06/22 2206 09/07/22 0618 09/07/22 1141 09/07/22 1617  GLUCAP 379* 270* 242* 202* 185*   Recent Labs  Lab 09/06/22 0944 09/07/22 0644  NA 125* 128*  K 5.4* 5.4*  CL 92* 93*  CO2 24 23  GLUCOSE 496* 236*  BUN 76* 78*  CREATININE 4.55* 4.61*  CALCIUM 8.3* 8.2*   Recent Labs  Lab 09/06/22 0944  AST 20  ALT 17  ALKPHOS 128*  BILITOT 0.8  PROT 6.7  ALBUMIN 1.8*   Recent Labs  Lab 09/06/22 0944 09/07/22 0644  WBC 26.7* 23.7*  NEUTROABS 25.4*  --   HGB 8.0* 7.1*  HCT 24.9* 21.6*  MCV 85.9 85.7  PLT 495* 448*   No results for input(s): "CKTOTAL", "CKMB", "CKMBINDEX", "TROPONINI" in the last 168 hours. Recent Labs    09/06/22 1001  LABPROT 23.2*  INR 2.0*   Recent Labs    09/06/22 1140  COLORURINE YELLOW  LABSPEC 1.011  PHURINE 5.0  GLUCOSEU >=500*  HGBUR LARGE*  BILIRUBINUR NEGATIVE  KETONESUR NEGATIVE  PROTEINUR 100*  NITRITE NEGATIVE  LEUKOCYTESUR LARGE*       Component Value Date/Time   CHOL 35 09/07/2022 0644   CHOL 204 (H) 01/02/2021 0838   TRIG 65 09/07/2022 0644   HDL <10 (L) 09/07/2022 0644   HDL 59 01/02/2021 0838   CHOLHDL NOT CALCULATED 09/07/2022 0644   VLDL 13 09/07/2022  0644   LDLCALC NOT CALCULATED 09/07/2022 0644   LDLCALC 130 (H) 01/02/2021 0838   Lab Results  Component Value Date   HGBA1C 9.7 (H) 09/06/2022      Component Value Date/Time   LABOPIA NONE DETECTED 11/18/2012 1115   COCAINSCRNUR NONE DETECTED 11/18/2012 1115   LABBENZ NONE DETECTED 11/18/2012 1115   AMPHETMU NONE DETECTED 11/18/2012 1115   THCU NONE DETECTED 11/18/2012 1115   LABBARB NONE DETECTED 11/18/2012 1115    No results for input(s): "ETH" in the last 168 hours.  I have personally reviewed the radiological images below and agree with the radiology interpretations.  US RENAL  Result Date: 09/07/2022 CLINICAL DATA:  Chronic kidney disease stage 3 Diabetes Hypertension EXAM: RENAL / URINARY TRACT ULTRASOUND COMPLETE COMPARISON:  CT chest abdomen pelvis 07/20/2022 FINDINGS: Right Kidney: Renal measurements: 9.2 x 3.8 x 6.0 cm = volume: 109 mL. Echogenicity within normal limits. No mass or hydronephrosis visualized. 2.8 cm simple cyst in the upper pole does not require dedicated imaging follow-up. 1.5 cm hypoechoic structure in the lower pole of the right kidney does not appear anechoic. Left Kidney: Renal measurements: 10.8 x 6.0 x 5.3 cm = volume: 179 mL. Echogenicity within normal limits. No mass. Mild left hydronephrosis, similar to prior exam. 3.4 cm simple cyst in the lower pole  does not require dedicated imaging follow-up. Bladder: Evaluation limited due to under distension. Other: None. IMPRESSION: 1. Unchanged mild left hydronephrosis. 2. 1.5 cm hypoechoic structure in the lower pole of the right kidney can not be characterized as a simple cyst given its internal echoes. It is more likely to be a mildly complicated cyst with proteinaceous or hemorrhagic debris thin a neoplasm. Recommend follow-up ultrasound in 6 months to again evaluate this finding. Electronically Signed   By: Acquanetta Belling M.D.   On: 09/07/2022 11:52   MR ANGIO NECK W WO CONTRAST  Result Date:  09/06/2022 CLINICAL DATA:  Infarct seen on noncontrast MRI EXAM: MRA NECK WITHOUT AND WITH CONTRAST MRA HEAD WITHOUT CONTRAST TECHNIQUE: Multiplanar and multiecho pulse sequences of the neck were obtained without and with intravenous contrast. Angiographic images of the neck were obtained using MRA technique without and with intravenous contrast; Angiographic images of the Circle of Willis were obtained using MRA technique without intravenous contrast. CONTRAST:  7mL GADAVIST GADOBUTROL 1 MMOL/ML IV SOLN COMPARISON:  None Available. FINDINGS: MRA NECK FINDINGS Aortic arch: The imaged aortic arch is normal. The origins of the major branch vessels are patent. The subclavian arteries are patent to the level imaged. Right carotid system: The right common, internal, and external carotid arteries are patent, with mild irregularity at the bulb likely reflecting atherosclerotic disease but no evidence of hemodynamically significant stenosis or occlusion. There is no evidence of dissection or aneurysm. Left carotid system: The left common, internal, and external carotid arteries are patent, with mild irregularity at the bulb likely reflecting atherosclerotic disease but no evidence of hemodynamically significant stenosis or occlusion there is no evidence of dissection or aneurysm Vertebral arteries: The vertebral artery origins are not well assessed. Otherwise, the vertebral arteries are patent with antegrade flow. There is no evidence of hemodynamically significant stenosis or occlusion there is no evidence of dissection or aneurysm. Other: None. MRA HEAD FINDINGS Image quality is degraded by motion artifact at the level of the cavernous ICAs. Anterior circulation: The intracranial ICAs are patent, without significant stenosis or occlusion. The MCAs are patent, without proximal high-grade stenosis or occlusion. The bilateral ACAS are patent, without proximal stenosis or occlusion. There is no definite aneurysm. Posterior  circulation: The bilateral V4 segments are patent. The basilar artery is patent. The major cerebellar arteries appear patent. The bilateral PCAs are patent, without proximal high-grade stenosis or occlusion. A left posterior communicating artery is identified. There is no definite aneurysm. Anatomic variants: None. Other: None. IMPRESSION: Patent vasculature of the head and neck without evidence of hemodynamically significant stenosis, occlusion, or dissection. Electronically Signed   By: Lesia Hausen M.D.   On: 09/06/2022 17:14   MR ANGIO HEAD WO CONTRAST  Result Date: 09/06/2022 CLINICAL DATA:  Infarct seen on noncontrast MRI EXAM: MRA NECK WITHOUT AND WITH CONTRAST MRA HEAD WITHOUT CONTRAST TECHNIQUE: Multiplanar and multiecho pulse sequences of the neck were obtained without and with intravenous contrast. Angiographic images of the neck were obtained using MRA technique without and with intravenous contrast; Angiographic images of the Circle of Willis were obtained using MRA technique without intravenous contrast. CONTRAST:  7mL GADAVIST GADOBUTROL 1 MMOL/ML IV SOLN COMPARISON:  None Available. FINDINGS: MRA NECK FINDINGS Aortic arch: The imaged aortic arch is normal. The origins of the major branch vessels are patent. The subclavian arteries are patent to the level imaged. Right carotid system: The right common, internal, and external carotid arteries are patent, with mild irregularity at the bulb  likely reflecting atherosclerotic disease but no evidence of hemodynamically significant stenosis or occlusion. There is no evidence of dissection or aneurysm. Left carotid system: The left common, internal, and external carotid arteries are patent, with mild irregularity at the bulb likely reflecting atherosclerotic disease but no evidence of hemodynamically significant stenosis or occlusion there is no evidence of dissection or aneurysm Vertebral arteries: The vertebral artery origins are not well assessed.  Otherwise, the vertebral arteries are patent with antegrade flow. There is no evidence of hemodynamically significant stenosis or occlusion there is no evidence of dissection or aneurysm. Other: None. MRA HEAD FINDINGS Image quality is degraded by motion artifact at the level of the cavernous ICAs. Anterior circulation: The intracranial ICAs are patent, without significant stenosis or occlusion. The MCAs are patent, without proximal high-grade stenosis or occlusion. The bilateral ACAS are patent, without proximal stenosis or occlusion. There is no definite aneurysm. Posterior circulation: The bilateral V4 segments are patent. The basilar artery is patent. The major cerebellar arteries appear patent. The bilateral PCAs are patent, without proximal high-grade stenosis or occlusion. A left posterior communicating artery is identified. There is no definite aneurysm. Anatomic variants: None. Other: None. IMPRESSION: Patent vasculature of the head and neck without evidence of hemodynamically significant stenosis, occlusion, or dissection. Electronically Signed   By: Lesia Hausen M.D.   On: 09/06/2022 17:14   MR BRAIN WO CONTRAST  Result Date: 09/06/2022 CLINICAL DATA:  Neuro deficit with acute stroke suspected EXAM: MRI HEAD WITHOUT CONTRAST TECHNIQUE: Multiplanar, multiecho pulse sequences of the brain and surrounding structures were obtained without intravenous contrast. 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 Small acute infarct in the postcentral gyrus on the right chronic small vessel ischemia to a moderate degree. No acute hemorrhage, hydrocephalus, or masslike finding Vascular: Normal flow voids. Skull and upper cervical spine: Normal marrow signal. Sinuses/Orbits: Negative. IMPRESSION: 1. Small acute infarct in the postcentral gyrus on the  right. 2. 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 Electronically Signed   By: Tiburcio Pea M.D.   On: 09/06/2022 11:40   DG Chest Portable 1 View  Result Date: 09/06/2022 CLINICAL DATA:  Pain EXAM: PORTABLE CHEST 1 VIEW COMPARISON:  08/07/2022 FINDINGS: Stable cardiomediastinal contours. Aortic atherosclerosis. Perihilar and bibasilar interstitial opacities. No pleural effusion or pneumothorax. IMPRESSION: Perihilar and bibasilar interstitial opacities, which may reflect edema or atypical infection. Electronically Signed   By: Duanne Guess D.O.   On: 09/06/2022 10:14     PHYSICAL EXAM  Temp:  [97.3 F (36.3 C)-98.6 F (37 C)] 97.6 F (36.4 C) (07/08 1645) Pulse Rate:  [53-60] 55 (07/08 1645) Resp:  [15-21] 20 (07/08 1645) BP: (114-131)/(43-54) 126/43 (07/08 1645) SpO2:  [91 %-100 %] 95 % (07/08 1645) Weight:  [76 kg] 76 kg (07/07 2035)  General - Well nourished, well developed, in no apparent distress.  Ophthalmologic - fundi not visualized due to noncooperation.  Cardiovascular - Regular rhythm and rate, not in A-fib.  Neuro - awake, alert, eyes open, orientated to age, place, time and people. No aphasia, fluent language, following all simple commands. Able to name and repeat. No gaze palsy, tracking bilaterally, visual field full, PERRL. No facial droop. Tongue midline. Bilateral UEs and LEs symmetric motor strength. Sensation symmetrical bilaterally, b/l FTN intact, gait not tested.    ASSESSMENT/PLAN [name] [name] is a 82 y.o. 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 Found to have UTI, AKI on CKD, leukocytosis, anemia, uncontrolled diabetes, cardiomyopathy.  MRI showed right postcentral gyrus tiny infarct, likely incidental finding.    Stroke, likely  incidental finding: Right postcentral gyrus tiny infarct, likely small vessel disease versus A-fib even on Eliquis MRI right posterior central gyrus tiny infarct, left large temporal encephalomalacia MRA head and neck unremarkable 2D Echo EF 35 to 40% in 08/2022 LDL pending, total cholesterol 35 HgbA1c 9.7 Eliquis for VTE prophylaxis Eliquis 2.5 twice daily prior to admission, now on Eliquis (apixaban) 2.5 twice daily. Best choice for pt with CKD3, will continue on discharge Patient counseled to be compliant with her antithrombotic medications Ongoing aggressive stroke risk factor management Therapy recommendations: SNF Disposition: Pending  Diabetes, uncontrolled HgbA1c 9.7 goal < 7.0 Uncontrolled Currently on insulin CBG monitoring SSI DM education and close PCP follow up  Hypertension Stable Avoid low BP Long term BP goal normal to sit  Hyperlipidemia Home meds: Lipitor 80 LDL pending but total cholesterol 35, goal < 70 Now on Lipitor 40 Continue statin at discharge  Leukocytosis UTI UA WBC more than 50 WBC 23.7 On meropenem  AKI on CKD3 Creatinine baseline around 3.0 This admission creatinine 4.55-4.60 Potassium 5.4 Nephrology on board  Other Stroke Risk Factors Advanced age Cardiomyopathy EF 60 to 40% in 08/2022, had cardioversion 08/2022.  Follow-up with cardiology  Other Active Problems History of HSV encephalitis in 05/2011, presented with partial seizure at the time.  Follow-up with Dr. Pearlean Brownie at [facility]. Severe anemia, hemoglobin 8.0->7.1, likely related to CKD, transfuse if hemoglobin less than 7.0  Hospital day # 1  Neurology will sign off. Please call with questions. Pt will follow up with Dr. Pearlean Brownie at [facility] on 11/30/2022 as scheduled. Thanks for the consult.   Marvel Plan, MD PhD Stroke Neurology 09/07/2022 6:33 PM    To contact Stroke Continuity provider, please refer to [email or web address]. After hours, contact General Neurology

## 2022-09-07 NOTE — Progress Notes (Signed)
PHARMACY - PHYSICIAN COMMUNICATION CRITICAL VALUE ALERT - BLOOD CULTURE IDENTIFICATION (BCID)  [name] [name] is an 82 y.o. female who presented to [facility] on 09/06/2022 with a chief complaint of weakness and dizziness.  Assessment:  Admitted with acute cystitis and CVA, started on meropenem for possible ESBL UTI given h/o ESBL UTI in May of this year, now growing Klebsiella pneumoniae with resistance (ESBL) in 1 of 4 blood cx bottles.  Name of physician (or Provider) ContactedRosalio Macadamia DO  Current antibiotics: meropenem  Changes to prescribed antibiotics recommended:  Patient is on recommended antibiotics - No changes needed  Results for orders placed or performed during the hospital encounter of 09/06/22  Blood Culture ID Panel (Reflexed) (Collected: 09/06/2022 10:12 AM)  Result Value Ref Range   Enterococcus faecalis NOT DETECTED NOT DETECTED   Enterococcus Faecium NOT DETECTED NOT DETECTED   Listeria monocytogenes NOT DETECTED NOT DETECTED   Staphylococcus species NOT DETECTED NOT DETECTED   Staphylococcus aureus (BCID) NOT DETECTED NOT DETECTED   Staphylococcus epidermidis NOT DETECTED NOT DETECTED   Staphylococcus lugdunensis NOT DETECTED NOT DETECTED   Streptococcus species NOT DETECTED NOT DETECTED   Streptococcus agalactiae NOT DETECTED NOT DETECTED   Streptococcus pneumoniae NOT DETECTED NOT DETECTED   Streptococcus pyogenes NOT DETECTED NOT DETECTED   A.calcoaceticus-baumannii NOT DETECTED NOT DETECTED   Bacteroides fragilis NOT DETECTED NOT DETECTED   Enterobacterales DETECTED (A) NOT DETECTED   Enterobacter cloacae complex NOT DETECTED NOT DETECTED   Escherichia coli NOT DETECTED NOT DETECTED   Klebsiella aerogenes NOT DETECTED NOT DETECTED   Klebsiella oxytoca NOT DETECTED NOT DETECTED   Klebsiella pneumoniae DETECTED (A) NOT DETECTED   Proteus species NOT DETECTED NOT DETECTED   Salmonella species NOT DETECTED NOT DETECTED   Serratia marcescens NOT DETECTED  NOT DETECTED   Haemophilus influenzae NOT DETECTED NOT DETECTED   Neisseria meningitidis NOT DETECTED NOT DETECTED   Pseudomonas aeruginosa NOT DETECTED NOT DETECTED   Stenotrophomonas maltophilia NOT DETECTED NOT DETECTED   Candida albicans NOT DETECTED NOT DETECTED   Candida auris NOT DETECTED NOT DETECTED   Candida glabrata NOT DETECTED NOT DETECTED   Candida krusei NOT DETECTED NOT DETECTED   Candida parapsilosis NOT DETECTED NOT DETECTED   Candida tropicalis NOT DETECTED NOT DETECTED   Cryptococcus neoformans/gattii NOT DETECTED NOT DETECTED   CTX-M ESBL DETECTED (A) NOT DETECTED   Carbapenem resistance IMP NOT DETECTED NOT DETECTED   Carbapenem resistance KPC NOT DETECTED NOT DETECTED   Carbapenem resistance NDM NOT DETECTED NOT DETECTED   Carbapenem resist OXA 48 LIKE NOT DETECTED NOT DETECTED   Carbapenem resistance VIM NOT DETECTED NOT DETECTED    Vernard Gambles, PharmD, BCPS  09/07/2022  6:11 AM

## 2022-09-07 NOTE — Consult Note (Signed)
[facility]  INPATIENT CONSULTATION  Reason for Consultation: AKI on CKD Requesting Provider: Dr. Isidoro Donning  HPI: [name] [name] is an 82 y.o. female atrial fibrillation, DM type 2, HFrEF, HTN, HL,  currently admitted for ESBL E coli UTI and CVA for whom nephrology is consulted for evaluation and management of AKI on CKD.    Admitted 09/06/22 with several week history of weakness, dizziness.  Labs showing Na 125, K 5.4, Bicarb 24, BUN 76, Cr 4.55, WBC 26.7, Hb 8, plt 495.  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    She was hydrated with 0.9% NS 50/hr for about 15h overnight.  Given lokelma.  AM labs showing Na 128, K 5.4, BUN 78, Cr 4.6, Hb 7.1. Renal US R 9.2, L 10.8 with mild hydro same as 08/2022 renal US and 07/2022 CT scan; R 1.5cm hypoechoic lesion.  Of note 08/2022 [place] states "renal atrophy"  Says she's doing ok currently.  + constipation but no N/V/D. No NSAIDs.  +gluteal pain and some other chronic sounding various aches.  No family present.   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   HYPERLIPIDEMIA 03/27/2006   HYPERTENSION 03/27/2006   Intertrochanteric fracture of right hip (HCC) 07/13/2012   LUNG NODULE 09/01/2006   Excision, Bx Benighn   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"   Osteopenia 2004   Dexa 2004 showed Osteopenia, DEXA 03/2007 normal   Osteopenia    Other chronic cystitis with hematuria    Pelvic fracture (HCC) 06/16/2016   S/P fall   Recurrent urinary tract infection    Seeing  Urology   RETINOPATHY, BACKGROUND NOS 03/27/2006   Seizures (HCC)    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   PSH: Past Surgical History:  Procedure Laterality Date   ANKLE FRACTURE SURGERY Bilateral 08/2015   "right one was in 2 places"   CARDIAC CATHETERIZATION N/A 05/17/2015   Procedure: Left Heart Cath and Coronary Angiography;  Surgeon: Corky Crafts, MD;  Location: [facility] INVASIVE CV LAB;  Service: Cardiovascular;  Laterality: N/A;   CARDIAC CATHETERIZATION N/A 05/17/2015   Procedure: Coronary Balloon Angioplasty;  Surgeon: Corky Crafts, MD;  Location: [facility] INVASIVE CV LAB;  Service: Cardiovascular;  Laterality: N/A;   CARDIOVERSION N/A 08/05/2022   Procedure: CARDIOVERSION;  Surgeon: Chilton Si, MD;  [facility] INVASIVE CV LAB;  Service: Cardiovascular;  Laterality: N/A;   FEMUR IM NAIL Right 07/15/2012   Procedure: INTRAMEDULLARY (IM) NAIL HIP;  Surgeon: Eulas Post, MD;  Location: [facility] OR;  Service: Orthopedics;  Laterality: Right;   FEMUR IM NAIL Left 12/02/2015   Procedure: INTRAMEDULLARY (IM) NAIL FEMORAL;  Surgeon: Eldred Manges, MD;  Location: [facility] OR;  Service: Orthopedics;  Laterality: Left;   FRACTURE SURGERY     INCISION AND DRAINAGE HIP Left 01/03/2016   Procedure: IRRIGATION AND DEBRIDEMENT HIP;  Surgeon: Eldred Manges, MD;  Location: [facility] OR;  Service: Orthopedics;  Laterality:  Left;   SHOULDER SURGERY Bilateral    "don't remember which side"    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  Medications Prior to Admission  Medication Sig Dispense Refill   alendronate (FOSAMAX) 35 MG tablet Take 35 mg by mouth every 7 (seven) days. Take with a full glass of water on an empty stomach.     amiodarone (PACERONE) 200 MG tablet Take 1 tablet (200 mg total) by mouth daily. 30 tablet 0   apixaban (ELIQUIS) 2.5 MG TABS tablet Take 1 tablet (2.5 mg total) by mouth 2 (two) times daily. To start likely on Friday 6/14 if recommended by Coumadin clinic after labs done 180 tablet 3   atorvastatin (LIPITOR) 80 MG tablet Take 1 tablet (80 mg total) by mouth daily. 90 tablet 1   ezetimibe (ZETIA) 10 MG tablet Take 1 tablet (10 mg total) by mouth daily. 90 tablet 2   hydrocortisone 2.5 % cream Apply topically 2 (two) times daily. 30 g 0   insulin NPH-regular Human (NOVOLIN 70/30 RELION) (70-30) 100 UNIT/ML injection 10 units in the morning and 5 units at night. (Patient taking differently: Inject 5-23 Units into the skin daily.)     metoprolol succinate (TOPROL-XL) 50 MG 24 hr tablet TAKE 1 TABLET BY MOUTH ONCE DAILY . APPOINTMENT REQUIRED FOR FUTURE REFILLS (Patient taking differently: Take 50 mg by mouth daily.) 90 tablet 0   Multiple Vitamins-Minerals (MULTIVITAMIN ADULT PO) Take 1 tablet by mouth daily with breakfast.      VITAMIN D, CHOLECALCIFEROL, PO Take 1 tablet by mouth daily.     furosemide (LASIX) 40 MG tablet Take 1 tablet (40 mg  total) by mouth daily as needed for fluid or edema. (Patient not taking: Reported on 09/06/2022)     Nitroglycerin 0.4 % OINT Apply 1 ribbon of ointment anally every 12 hours for anal pain. (Patient not taking: Reported on 09/06/2022) 30 g 0    ALLERGIES:   Allergies  Allergen Reactions   Procaine Hcl Anaphylaxis   Amoxicillin Itching    FAM HX: Family History  Problem Relation Age of Onset   Diabetes Father    Heart attack Father 78   Diabetes Sister    Drug abuse Son    Cancer Neg Hx        no  hx of colon or breast cancer    Social History:   reports that she quit smoking about 44 years ago. Her smoking use included cigarettes. She has never used smokeless tobacco. She reports that she does not drink alcohol and does not use drugs.  ROS: 12 system ROS neg except per HPI above  Blood pressure (!) 119/43, pulse (!) 58, temperature 98.6 F (37 C), temperature source Oral, resp. rate 18, height 5\' 9"  (1.753 m), weight 76 kg, SpO2 95 %. PHYSICAL EXAM: Gen: lying flat in bed in no distress  Eyes: anicteric, EOMI ENT: MM tacky  Neck: no JVD CV:  brady and regular, no rub Abd: soft, nontender Back:CTAB GU: purewick with yellow urine in cannister - < present Extr:  no edema Neuro: conversant, seems to trail off in details when discussing med history Skin: no rashes noted   Results for orders placed or performed during the hospital encounter of 09/06/22 (from the past 48 hour(s))  CBG monitoring, ED     Status: Abnormal   Collection Time: 09/06/22  9:35 AM  Result Value Ref Range   Glucose-Capillary 486 (H) 70 - 99 mg/dL    Comment: Glucose reference range applies only to samples taken after fasting for at least 8 hours.  SARS Coronavirus 2 by RT PCR (hospital order, performed in [facility] hospital lab) *cepheid single result test* Anterior Nasal Swab     Status: None   Collection Time: 09/06/22  9:37 AM   Specimen: Anterior Nasal Swab  Result Value Ref Range   SARS  Coronavirus 2 by RT PCR NEGATIVE NEGATIVE    Comment: (NOTE) SARS-CoV-2 target nucleic acids are NOT DETECTED.  The SARS-CoV-2 RNA is generally detectable in upper and lower respiratory specimens during the acute phase of infection. The lowest concentration of SARS-CoV-2 viral copies this assay can detect is 250 copies / mL. A negative result does not preclude SARS-CoV-2 infection and should not be used as the sole basis for treatment or other patient management decisions.  A negative result may occur with improper specimen collection / handling, submission of specimen other than nasopharyngeal swab, presence of viral mutation(s) within the areas targeted by this assay, and inadequate number of viral copies (<250 copies / mL). A negative result must be combined with clinical observations, patient history, and epidemiological information.  Fact Sheet for Patients:   RoadLapTop.co.za  Fact Sheet for Healthcare Providers: http://kim-miller.com/  This test is not yet approved or  cleared by the [place] FDA and has been authorized for detection and/or diagnosis of SARS-CoV-2 by FDA under an Emergency Use Authorization (EUA).  This EUA will remain in effect (meaning this test can be used) for the duration of the COVID-19 declaration under Section 564(b)(1) of the Act, 21 U.S.C. section 360bbb-3(b)(1), unless the authorization is terminated or revoked sooner.  Performed at [facility], [address], [place], [place] [address]   CBC with Differential     Status: Abnormal   Collection Time: 09/06/22  9:44 AM  Result Value Ref Range   WBC 26.7 (H) 4.0 - 10.5 K/uL   RBC 2.90 (L) 3.87 - 5.11 MIL/uL   Hemoglobin 8.0 (L) 12.0 - 15.0 g/dL   HCT 57.8 (L) 46.9 - 62.9 %   MCV 85.9 80.0 - 100.0 fL   MCH 27.6 26.0 - 34.0 pg   MCHC 32.1 30.0 - 36.0 g/dL   RDW 52.8 (H) 41.3 - 24.4 %   Platelets 495 (H)  150 - 400 K/uL    nRBC 0.0 0.0 - 0.2 %   Neutrophils Relative % 95 %   Neutro Abs 25.4 (H) 1.7 - 7.7 K/uL   Lymphocytes Relative 2 %   Lymphs Abs 0.5 (L) 0.7 - 4.0 K/uL   Monocytes Relative 2 %   Monocytes Absolute 0.5 0.1 - 1.0 K/uL   Eosinophils Relative 1 %   Eosinophils Absolute 0.3 0.0 - 0.5 K/uL   Basophils Relative 0 %   Basophils Absolute 0.0 0.0 - 0.1 K/uL   WBC Morphology VACUOLATED NEUTROPHILS    Abs Immature Granulocytes 0.00 0.00 - 0.07 K/uL    Comment: Performed at [facility], [address] [place], [place] [address]  Comprehensive metabolic panel     Status: Abnormal   Collection Time: 09/06/22  9:44 AM  Result Value Ref Range   Sodium 125 (L) 135 - 145 mmol/L   Potassium 5.4 (H) 3.5 - 5.1 mmol/L   Chloride 92 (L) 98 - 111 mmol/L   CO2 24 22 - 32 mmol/L   Glucose, Bld 496 (H) 70 - 99 mg/dL    Comment: Glucose reference range applies only to samples taken after fasting for at least 8 hours.   BUN 76 (H) 8 - 23 mg/dL   Creatinine, Ser 2.95 (H) 0.44 - 1.00 mg/dL   Calcium 8.3 (L) 8.9 - 10.3 mg/dL   Total Protein 6.7 6.5 - 8.1 g/dL   Albumin 1.8 (L) 3.5 - 5.0 g/dL   AST 20 15 - 41 U/L   ALT 17 0 - 44 U/L   Alkaline Phosphatase 128 (H) 38 - 126 U/L   Total Bilirubin 0.8 0.3 - 1.2 mg/dL   GFR, Estimated 9 (L) >60 mL/min    Comment: (NOTE) Calculated using the CKD-EPI Creatinine Equation (2021)    Anion gap 9 5 - 15    Comment: Performed at [facility], [address] [place], [place] [address]  Hemoglobin A1c     Status: Abnormal   Collection Time: 09/06/22  9:51 AM  Result Value Ref Range   Hgb A1c MFr Bld 9.7 (H) 4.8 - 5.6 %    Comment: (NOTE)         Prediabetes: 5.7 - 6.4         Diabetes: >6.4         Glycemic control for adults with diabetes: <7.0    Mean Plasma Glucose 232 mg/dL    Comment: (NOTE) Performed At: [facility] [address] [place], [place] [id number] Jolene Schimke MD EX:[id number]   Protime-INR      Status: Abnormal   Collection Time: 09/06/22 10:01 AM  Result Value Ref Range   Prothrombin Time 23.2 (H) 11.4 - 15.2 seconds   INR 2.0 (H) 0.8 - 1.2    Comment: (NOTE) INR goal varies based on device and disease states. Performed at [facility], [address], [place], [place] [address]   Lactic acid, plasma     Status: None   Collection Time: 09/06/22 10:12 AM  Result Value Ref Range   Lactic Acid, Venous 1.5 0.5 - 1.9 mmol/L    Comment: Performed at [facility], [address], [place], [place] [address]  Culture, blood (routine x 2)     Status: None (Preliminary result)   Collection Time: 09/06/22 10:12 AM   Specimen: Left Antecubital; Blood  Result Value Ref Range   Specimen Description  LEFT ANTECUBITAL BLOOD Performed at [facility], [address] [place], [place] [address]    Special Requests      BOTTLES DRAWN AEROBIC AND ANAEROBIC Blood Culture adequate volume Performed at [name], [address] [place], [place] [address]    Culture  Setup Time      GRAM NEGATIVE RODS IN BOTH AEROBIC AND ANAEROBIC BOTTLES CRITICAL RESULT CALLED TO, READ BACK BY AND VERIFIED WITH: PHARMD VERONDA B ON  [id number] @0530  BY SM Performed at [facility], [address] [place], [place] [address]    Culture GRAM NEGATIVE RODS    Report Status PENDING   Blood Culture ID Panel (Reflexed)     Status: Abnormal   Collection Time: 09/06/22 10:12 AM  Result Value Ref Range   Enterococcus faecalis NOT DETECTED NOT DETECTED   Enterococcus Faecium NOT DETECTED NOT DETECTED   Listeria monocytogenes NOT DETECTED NOT DETECTED   Staphylococcus species NOT DETECTED NOT DETECTED   Staphylococcus aureus (BCID) NOT DETECTED NOT DETECTED   Staphylococcus epidermidis NOT DETECTED NOT DETECTED   Staphylococcus lugdunensis NOT DETECTED NOT DETECTED   Streptococcus species NOT DETECTED NOT DETECTED   Streptococcus agalactiae  NOT DETECTED NOT DETECTED   Streptococcus pneumoniae NOT DETECTED NOT DETECTED   Streptococcus pyogenes NOT DETECTED NOT DETECTED   A.calcoaceticus-baumannii NOT DETECTED NOT DETECTED   Bacteroides fragilis NOT DETECTED NOT DETECTED   Enterobacterales DETECTED (A) NOT DETECTED    Comment: Enterobacterales represent a large order of gram negative bacteria, not a single organism. CRITICAL RESULT CALLED TO, READ BACK BY AND VERIFIED WITH: PHARMD VERONDA B ON [id number] @0530     Enterobacter cloacae complex NOT DETECTED NOT DETECTED   Escherichia coli NOT DETECTED NOT DETECTED   Klebsiella aerogenes NOT DETECTED NOT DETECTED   Klebsiella oxytoca NOT DETECTED NOT DETECTED   Klebsiella pneumoniae DETECTED (A) NOT DETECTED    Comment: CRITICAL RESULT CALLED TO, READ BACK BY AND VERIFIED WITH: PHARMD VERONDA B ON [id number] @0530     Proteus species NOT DETECTED NOT DETECTED   Salmonella species NOT DETECTED NOT DETECTED   Serratia marcescens NOT DETECTED NOT DETECTED   Haemophilus influenzae NOT DETECTED NOT DETECTED   Neisseria meningitidis NOT DETECTED NOT DETECTED   Pseudomonas aeruginosa NOT DETECTED NOT DETECTED   Stenotrophomonas maltophilia NOT DETECTED NOT DETECTED   Candida albicans NOT DETECTED NOT DETECTED   Candida auris NOT DETECTED NOT DETECTED   Candida glabrata NOT DETECTED NOT DETECTED   Candida krusei NOT DETECTED NOT DETECTED   Candida parapsilosis NOT DETECTED NOT DETECTED   Candida tropicalis NOT DETECTED NOT DETECTED   Cryptococcus neoformans/gattii NOT DETECTED NOT DETECTED   CTX-M ESBL DETECTED (A) NOT DETECTED    Comment: CRITICAL RESULT CALLED TO, READ BACK BY AND VERIFIED WITH: PHARMD VERONDA B ON [id number] @0530  (NOTE) Extended spectrum beta-lactamase detected. Recommend a carbapenem as initial therapy.      Carbapenem resistance IMP NOT DETECTED NOT DETECTED   Carbapenem resistance KPC NOT DETECTED NOT DETECTED   Carbapenem resistance NDM NOT DETECTED NOT DETECTED    Carbapenem resist OXA 48 LIKE NOT DETECTED NOT DETECTED   Carbapenem resistance VIM NOT DETECTED NOT DETECTED    Comment: Performed at [facility], [address], [place], [place] [address]  Culture, blood (routine x 2)     Status: None (Preliminary result)   Collection Time: 09/06/22 10:27 AM   Specimen: Right Antecubital; Blood  Result Value Ref Range  Specimen Description      RIGHT ANTECUBITAL BLOOD Performed at [facility], [address], [place], [place] [address]    Special Requests      BOTTLES DRAWN AEROBIC AND ANAEROBIC Blood Culture adequate volume Performed at [facility], [address] [place], [place] [address]    Culture  Setup Time      GRAM POSITIVE RODS AEROBIC BOTTLE ONLY CRITICAL RESULT CALLED TO, READ BACK BY AND VERIFIED WITH: RN PATY DOWD [phone] [id number] BY Berline Chough, [place] NO ORGANISMS SEEN ANAEROBIC BOTTLE ONLY Performed at [facility], [address], [place], [place] [address]    Culture GRAM POSITIVE RODS    Report Status PENDING   Urinalysis, Routine w reflex microscopic -Urine, Clean Catch     Status: Abnormal   Collection Time: 09/06/22 11:40 AM  Result Value Ref Range   Color, Urine YELLOW YELLOW   APPearance TURBID (A) CLEAR   Specific Gravity, Urine 1.011 1.005 - 1.030   pH 5.0 5.0 - 8.0   Glucose, UA >=500 (A) NEGATIVE mg/dL   Hgb urine dipstick LARGE (A) NEGATIVE   Bilirubin Urine NEGATIVE NEGATIVE   Ketones, ur NEGATIVE NEGATIVE mg/dL   Protein, ur 332 (A) NEGATIVE mg/dL   Nitrite NEGATIVE NEGATIVE   Leukocytes,Ua LARGE (A) NEGATIVE   RBC / HPF >50 0 - 5 RBC/hpf   WBC, UA >50 0 - 5 WBC/hpf   Bacteria, UA MANY (A) NONE SEEN   Squamous Epithelial / HPF 11-20 0 - 5 /HPF   Mucus PRESENT     Comment: Performed at [facility], [address] [place], [place] [address]  Urine Culture     Status: Abnormal (Preliminary result)   Collection Time: 09/06/22 11:40 AM   Specimen:  Urine, Clean Catch  Result Value Ref Range   Specimen Description      URINE, CLEAN CATCH Performed at [facility], [address], [place], [place] [address]    Special Requests      NONE Performed at [facility], [address], [place], [place] [address]    Culture (A)     >=100,000 COLONIES/mL GRAM NEGATIVE RODS SUSCEPTIBILITIES TO FOLLOW Performed at [facility], [address], [place], [place] [address]    Report Status PENDING   POC CBG, ED     Status: Abnormal   Collection Time: 09/06/22 12:00 PM  Result Value Ref Range   Glucose-Capillary 446 (H) 70 - 99 mg/dL    Comment: Glucose reference range applies only to samples taken after fasting for at least 8 hours.  Lactic acid, plasma     Status: None   Collection Time: 09/06/22  1:00 PM  Result Value Ref Range   Lactic Acid, Venous 1.8 0.5 - 1.9 mmol/L    Comment: Performed at [facility], [address] [place], [place] [address]  CBG monitoring, ED     Status: Abnormal   Collection Time: 09/06/22  5:18 PM  Result Value Ref Range   Glucose-Capillary 379 (H) 70 - 99 mg/dL    Comment: Glucose reference range applies only to samples taken after fasting for at least 8 hours.  Glucose, capillary     Status: Abnormal   Collection Time: 09/06/22 10:06 PM  Result Value Ref Range   Glucose-Capillary 270 (H) 70 - 99 mg/dL    Comment: Glucose reference range applies only to samples taken after fasting for  at least 8 hours.   Comment 1 Notify RN    Comment 2 Document in Chart   Glucose, capillary     Status: Abnormal   Collection Time: 09/07/22  6:18 AM  Result Value Ref Range   Glucose-Capillary 242 (H) 70 - 99 mg/dL    Comment: Glucose reference range applies only to samples taken after fasting for at least 8 hours.   Comment 1 Notify RN    Comment 2 Document in Chart   Lipid panel     Status: Abnormal   Collection Time: 09/07/22  6:44 AM  Result Value  Ref Range   Cholesterol 35 0 - 200 mg/dL   Triglycerides 65 <161 mg/dL   HDL <09 (L) >60 mg/dL   Total CHOL/HDL Ratio NOT CALCULATED RATIO   VLDL 13 0 - 40 mg/dL   LDL Cholesterol NOT CALCULATED 0 - 99 mg/dL    Comment: Performed at [facility], [address] [place], [place] [address]  Vitamin B12     Status: Abnormal   Collection Time: 09/07/22  6:44 AM  Result Value Ref Range   Vitamin B-12 1,280 (H) 180 - 914 pg/mL    Comment: (NOTE) This assay is not validated for testing neonatal or myeloproliferative syndrome specimens for Vitamin B12 levels. Performed at [facility], [address] [place], [place] [address]   Folate     Status: None   Collection Time: 09/07/22  6:44 AM  Result Value Ref Range   Folate 11.6 >5.9 ng/mL    Comment: Performed at [facility], [address], [place], [place] [address]  Basic metabolic panel     Status: Abnormal   Collection Time: 09/07/22  6:44 AM  Result Value Ref Range   Sodium 128 (L) 135 - 145 mmol/L   Potassium 5.4 (H) 3.5 - 5.1 mmol/L   Chloride 93 (L) 98 - 111 mmol/L   CO2 23 22 - 32 mmol/L   Glucose, Bld 236 (H) 70 - 99 mg/dL    Comment: Glucose reference range applies only to samples taken after fasting for at least 8 hours.   BUN 78 (H) 8 - 23 mg/dL   Creatinine, Ser 5.62 (H) 0.44 - 1.00 mg/dL   Calcium 8.2 (L) 8.9 - 10.3 mg/dL   GFR, Estimated 9 (L) >60 mL/min    Comment: (NOTE) Calculated using the CKD-EPI Creatinine Equation (2021)    Anion gap 12 5 - 15    Comment: Performed at [facility], [address], [place], [place] [address]  CBC     Status: Abnormal   Collection Time: 09/07/22  6:44 AM  Result Value Ref Range   WBC 23.7 (H) 4.0 - 10.5 K/uL   RBC 2.52 (L) 3.87 - 5.11 MIL/uL   Hemoglobin 7.1 (L) 12.0 - 15.0 g/dL   HCT 57.8 (L) 46.9 - 62.9 %   MCV 85.7 80.0 - 100.0 fL   MCH 28.2 26.0 - 34.0 pg   MCHC 32.9 30.0 - 36.0 g/dL   RDW 52.8 (H) 41.3 - 24.4 %   Platelets 448 (H) 150 -  400 K/uL   nRBC 0.0 0.0 - 0.2 %    Comment: Performed at [facility], [address] [place], [place] [address]  Procalcitonin     Status: None   Collection Time: 09/07/22  6:44 AM  Result Value Ref Range   Procalcitonin 1.98 ng/mL    Comment:  Interpretation: PCT > 0.5 ng/mL and <= 2 ng/mL: Systemic infection (sepsis) is possible, but other conditions are known to elevate PCT as well. (NOTE)       Sepsis PCT Algorithm           Lower Respiratory Tract                                      Infection PCT Algorithm    ----------------------------     ----------------------------         PCT < 0.25 ng/mL                PCT < 0.10 ng/mL          Strongly encourage             Strongly discourage   discontinuation of antibiotics    initiation of antibiotics    ----------------------------     -----------------------------       PCT 0.25 - 0.50 ng/mL            PCT 0.10 - 0.25 ng/mL               OR       >80% decrease in PCT            Discourage initiation of                                            antibiotics      Encourage discontinuation           of antibiotics    ----------------------------     -----------------------------         PCT >= 0.50 ng/mL              PCT 0.26 - 0.50 ng/mL                AND       <80% decrease in PCT             Encourage initiation of                                             antibiotics       Encourage continuation           of antibiotics    ----------------------------     -----------------------------        PCT >= 0.50 ng/mL                  PCT > 0.50 ng/mL               AND         increase in PCT                  Strongly encourage                                      initiation of antibiotics    Strongly encourage escalation           of antibiotics                                     -----------------------------  PCT <= 0.25 ng/mL                                                  OR                                        > 80% decrease in PCT                                      Discontinue / Do not initiate                                             antibiotics  Performed at [facility], [address], [place], [place] [address]   Type and screen [facility]     Status: None (Preliminary result)   Collection Time: 09/07/22 11:16 AM  Result Value Ref Range   ABO/RH(D) A NEG    Antibody Screen NEG    Sample Expiration      09/10/2022,2359 Performed at [facility], [address] [address], [place], [place] [address]    Unit Number [id number]    Blood Component Type RED CELLS,LR    Unit division 00    Status of Unit ALLOCATED    Transfusion Status OK TO TRANSFUSE    Crossmatch Result Compatible   Prepare RBC (crossmatch)     Status: None   Collection Time: 09/07/22 11:18 AM  Result Value Ref Range   Order Confirmation      ORDER PROCESSED BY BLOOD BANK Performed at [facility], [address] [place], [place] [address]   Glucose, capillary     Status: Abnormal   Collection Time: 09/07/22 11:41 AM  Result Value Ref Range   Glucose-Capillary 202 (H) 70 - 99 mg/dL    Comment: Glucose reference range applies only to samples taken after fasting for at least 8 hours.   Comment 1 Notify RN    Comment 2 Document in Chart    *Note: Due to a large number of results and/or encounters for the requested time period, some results have not been displayed. A complete set of results can be found in Results Review.    US RENAL  Result Date: 09/07/2022 CLINICAL DATA:  Chronic kidney disease stage 3 Diabetes Hypertension EXAM: RENAL / URINARY TRACT ULTRASOUND COMPLETE COMPARISON:  CT chest abdomen pelvis 07/20/2022 FINDINGS: Right Kidney: Renal measurements: 9.2 x 3.8 x 6.0 cm = volume: 109 mL. Echogenicity within normal limits. No mass or hydronephrosis visualized. 2.8 cm simple cyst in the upper pole does not require dedicated  imaging follow-up. 1.5 cm hypoechoic structure in the lower pole of the right kidney does not appear anechoic. Left Kidney: Renal measurements: 10.8 x 6.0 x 5.3 cm = volume: 179 mL. Echogenicity within normal limits. No mass. Mild left hydronephrosis, similar to prior exam. 3.4 cm simple cyst in the lower pole does not require dedicated imaging follow-up. Bladder: Evaluation limited due to under distension. Other: None. IMPRESSION: 1. Unchanged mild left hydronephrosis. 2. 1.5 cm  hypoechoic structure in the lower pole of the right kidney can not be characterized as a simple cyst given its internal echoes. It is more likely to be a mildly complicated cyst with proteinaceous or hemorrhagic debris thin a neoplasm. Recommend follow-up ultrasound in 6 months to again evaluate this finding. Electronically Signed   By: Acquanetta Belling M.D.   On: 09/07/2022 11:52   MR ANGIO NECK W WO CONTRAST  Result Date: 09/06/2022 CLINICAL DATA:  Infarct seen on noncontrast MRI EXAM: MRA NECK WITHOUT AND WITH CONTRAST MRA HEAD WITHOUT CONTRAST TECHNIQUE: Multiplanar and multiecho pulse sequences of the neck were obtained without and with intravenous contrast. Angiographic images of the neck were obtained using MRA technique without and with intravenous contrast; Angiographic images of the Circle of Willis were obtained using MRA technique without intravenous contrast. CONTRAST:  7mL GADAVIST GADOBUTROL 1 MMOL/ML IV SOLN COMPARISON:  None Available. FINDINGS: MRA NECK FINDINGS Aortic arch: The imaged aortic arch is normal. The origins of the major branch vessels are patent. The subclavian arteries are patent to the level imaged. Right carotid system: The right common, internal, and external carotid arteries are patent, with mild irregularity at the bulb likely reflecting atherosclerotic disease but no evidence of hemodynamically significant stenosis or occlusion. There is no evidence of dissection or aneurysm. Left carotid system: The  left common, internal, and external carotid arteries are patent, with mild irregularity at the bulb likely reflecting atherosclerotic disease but no evidence of hemodynamically significant stenosis or occlusion there is no evidence of dissection or aneurysm Vertebral arteries: The vertebral artery origins are not well assessed. Otherwise, the vertebral arteries are patent with antegrade flow. There is no evidence of hemodynamically significant stenosis or occlusion there is no evidence of dissection or aneurysm. Other: None. MRA HEAD FINDINGS Image quality is degraded by motion artifact at the level of the cavernous ICAs. Anterior circulation: The intracranial ICAs are patent, without significant stenosis or occlusion. The MCAs are patent, without proximal high-grade stenosis or occlusion. The bilateral ACAS are patent, without proximal stenosis or occlusion. There is no definite aneurysm. Posterior circulation: The bilateral V4 segments are patent. The basilar artery is patent. The major cerebellar arteries appear patent. The bilateral PCAs are patent, without proximal high-grade stenosis or occlusion. A left posterior communicating artery is identified. There is no definite aneurysm. Anatomic variants: None. Other: None. IMPRESSION: Patent vasculature of the head and neck without evidence of hemodynamically significant stenosis, occlusion, or dissection. Electronically Signed   By: Lesia Hausen M.D.   On: 09/06/2022 17:14   MR ANGIO HEAD WO CONTRAST  Result Date: 09/06/2022 CLINICAL DATA:  Infarct seen on noncontrast MRI EXAM: MRA NECK WITHOUT AND WITH CONTRAST MRA HEAD WITHOUT CONTRAST TECHNIQUE: Multiplanar and multiecho pulse sequences of the neck were obtained without and with intravenous contrast. Angiographic images of the neck were obtained using MRA technique without and with intravenous contrast; Angiographic images of the Circle of Willis were obtained using MRA technique without intravenous contrast.  CONTRAST:  7mL GADAVIST GADOBUTROL 1 MMOL/ML IV SOLN COMPARISON:  None Available. FINDINGS: MRA NECK FINDINGS Aortic arch: The imaged aortic arch is normal. The origins of the major branch vessels are patent. The subclavian arteries are patent to the level imaged. Right carotid system: The right common, internal, and external carotid arteries are patent, with mild irregularity at the bulb likely reflecting atherosclerotic disease but no evidence of hemodynamically significant stenosis or occlusion. There is no evidence of dissection or aneurysm. Left carotid system:  The left common, internal, and external carotid arteries are patent, with mild irregularity at the bulb likely reflecting atherosclerotic disease but no evidence of hemodynamically significant stenosis or occlusion there is no evidence of dissection or aneurysm Vertebral arteries: The vertebral artery origins are not well assessed. Otherwise, the vertebral arteries are patent with antegrade flow. There is no evidence of hemodynamically significant stenosis or occlusion there is no evidence of dissection or aneurysm. Other: None. MRA HEAD FINDINGS Image quality is degraded by motion artifact at the level of the cavernous ICAs. Anterior circulation: The intracranial ICAs are patent, without significant stenosis or occlusion. The MCAs are patent, without proximal high-grade stenosis or occlusion. The bilateral ACAS are patent, without proximal stenosis or occlusion. There is no definite aneurysm. Posterior circulation: The bilateral V4 segments are patent. The basilar artery is patent. The major cerebellar arteries appear patent. The bilateral PCAs are patent, without proximal high-grade stenosis or occlusion. A left posterior communicating artery is identified. There is no definite aneurysm. Anatomic variants: None. Other: None. IMPRESSION: Patent vasculature of the head and neck without evidence of hemodynamically significant stenosis, occlusion, or  dissection. Electronically Signed   By: Lesia Hausen M.D.   On: 09/06/2022 17:14   MR BRAIN WO CONTRAST  Result Date: 09/06/2022 CLINICAL DATA:  Neuro deficit with acute stroke suspected EXAM: MRI HEAD WITHOUT CONTRAST TECHNIQUE: Multiplanar, multiecho pulse sequences of the brain and surrounding structures were obtained without intravenous contrast. 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 Small acute infarct in the postcentral gyrus on the right chronic small vessel ischemia to a moderate degree. No acute hemorrhage, hydrocephalus, or masslike finding Vascular: Normal flow voids. Skull and upper cervical spine: Normal marrow signal. Sinuses/Orbits: Negative. IMPRESSION: 1. 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 2. 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 Electronically Signed   By: Tiburcio Pea M.D.   On: 09/06/2022 11:40   DG Chest Portable 1 View  Result Date: 09/06/2022 CLINICAL DATA:  Pain EXAM: PORTABLE CHEST 1 VIEW COMPARISON:  08/07/2022 FINDINGS: Stable cardiomediastinal contours. Aortic atherosclerosis. Perihilar and bibasilar interstitial opacities. No pleural effusion or pneumothorax. IMPRESSION: Perihilar and bibasilar interstitial opacities, which may reflect edema or atypical infection. Electronically Signed   By: Duanne Guess D.O.   On: 09/06/2022 10:14    Assessment/Plan **AKI on CKD:  baseline Cr was in mid 2s > 08/2022 admission 2.6 > 3.6 > 3 now presenting with Cr 4.55 which is 4.6 today.  Says no h/o outpt nephrology.  UA consistent with UTI.  Imaging with no new findings - do not suspect mild hydro on L significantly contributing.  Modest hypotension noted - suspect ATN related to acute infection + hypovolemia.  Based on exam  still appears dry - cont judicious IVF for now. No indications for dialysis currently and hopefully will see some improvement with supportive care.  Will need more collateral info re: functional status, etc to determine feasibility of dialysis should it become indicated.   Avoid hypotension and nephrotoxins.   **acute CVA: post central gyrus, neurology following.  PT/OT have been consulted.   **Hyponatremia:  mostly corrects related to hyperglycemia.  Suspect hypovolemia.    **Anemia, normocytic:  Baseline in the past few months 8-9s.  Was 8 on presentation now 7.1 after hydration.  Denies overt  bleeding.   Check iron indices, other nutritional deficiencies being screened.   **Hyperkalemia: being treated with lokelma, serial K checks, next in AM ok.   **HFrEF:  EF 35% 08/2022.  Judicious fluids.  Meds limited by GFR.    **UTI: UA consistent, h/o ESBL E coli.  On merrem.  Culture pending.   **R renal hypoechoic structure:  rads rec 39mo f/u US ensure stable  **DM: insulin per primary.   **A fib:  on eliquis, BB, amiodarone.   Will follow, call with concerns.   Tyler Pita 09/07/2022, 1:28 PM

## 2022-09-07 NOTE — Evaluation (Signed)
Speech Language Pathology Evaluation Patient Details Name: [name] [name] MRN: [id number] DOB: 1940/12/28 Today's Date: 09/07/2022 Time: 8657-8469 SLP Time Calculation (min) (ACUTE ONLY): 12 min  Problem List:  Patient Active Problem List   Diagnosis Date Noted   Bacteremia 09/07/2022   Acute CVA (cerebrovascular accident) (HCC) 09/06/2022   A-fib (HCC) 08/04/2022   Atrial fibrillation with rapid ventricular response (HCC) 08/03/2022   AKI (acute kidney injury) (HCC) 07/20/2022   Supratherapeutic INR 07/08/2022   Hemorrhagic cystitis 07/08/2022   Gross hematuria 07/07/2022   CKD (chronic kidney disease) stage 4, GFR 15-29 ml/min (HCC) 04/10/2020   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   Osteoporosis 07/22/2018   Closed compression fracture of L1 vertebra (HCC) 07/19/2018   Cardiomyopathy (HCC) 04/07/2018   Foot ulcer (HCC) 07/24/2016   Gram-negative infection    CKD (chronic kidney disease) stage 3, GFR 30-59 ml/min (HCC)    Postoperative anemia due to acute blood loss 01/04/2016   Left hip postoperative wound infection 01/02/2016   CAD S/P percutaneous coronary angioplasty 05/20/2015   Acute systolic CHF (congestive heart failure) (HCC) 05/20/2015   PAF (paroxysmal atrial fibrillation) (HCC) 05/20/2015   Acute kidney injury superimposed on chronic kidney disease (HCC) 05/11/2015   PCP NOTES >>>>>>>>>>>>>>>> 12/28/2014   Memory loss 05/17/2014   Diabetes (HCC) 01/23/2014   Anxiety and depression, PCP notes  11/24/2013   Severe obesity (BMI >= 40) (HCC) 07/14/2013   Encounter for therapeutic drug monitoring 06/02/2013   History of encephalitis 11/18/2012   Intertrochanteric fracture of right hip (HCC) 07/13/2012   GERD (gastroesophageal reflux disease) 10/05/2011   Seizure disorder (HCC) 05/24/2011   UTI (urinary tract infection) 05/22/2011   Annual physical exam >>>>>>>>>>>>>>>>>>>>>>>>>>>>>>>.. 02/10/2011   Atrial fibrillation (HCC)    LUNG NODULE 09/01/2006    Hyperlipidemia 03/27/2006   Hereditary and idiopathic peripheral neuropathy 03/27/2006   RETINOPATHY, BACKGROUND NOS 03/27/2006   Essential hypertension 03/27/2006   Past Medical History:  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   HYPERLIPIDEMIA 03/27/2006   HYPERTENSION 03/27/2006   Intertrochanteric fracture of right hip (HCC) 07/13/2012   LUNG NODULE 09/01/2006   Excision, Bx Benighn   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"   Osteopenia 2004   Dexa 2004 showed Osteopenia, DEXA 03/2007 normal   Osteopenia    Other chronic cystitis with hematuria    Pelvic fracture (HCC) 06/16/2016   S/P fall   Recurrent urinary tract infection    Seeing Urology   RETINOPATHY, BACKGROUND NOS 03/27/2006   Seizures (HCC)    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   Past Surgical History:  Past Surgical History:  Procedure Laterality Date   ANKLE FRACTURE SURGERY Bilateral 08/2015   "right one was in 2 places"   CARDIAC CATHETERIZATION N/A 05/17/2015   Procedure: Left Heart Cath and Coronary Angiography;  Surgeon: Corky Crafts, MD;  Location: [facility] INVASIVE CV LAB;  Service: Cardiovascular;  Laterality: N/A;   CARDIAC CATHETERIZATION N/A 05/17/2015  Procedure: Coronary Balloon Angioplasty;  Surgeon: Corky Crafts, MD;  Location: [facility] INVASIVE CV LAB;  Service: Cardiovascular;  Laterality: N/A;   CARDIOVERSION N/A 08/05/2022   Procedure: CARDIOVERSION;  Surgeon: Chilton Si, MD;  Location: [facility] INVASIVE CV LAB;  Service: Cardiovascular;   Laterality: N/A;   FEMUR IM NAIL Right 07/15/2012   Procedure: INTRAMEDULLARY (IM) NAIL HIP;  Surgeon: Eulas Post, MD;  Location: [facility] OR;  Service: Orthopedics;  Laterality: Right;   FEMUR IM NAIL Left 12/02/2015   Procedure: INTRAMEDULLARY (IM) NAIL FEMORAL;  Surgeon: Eldred Manges, MD;  Location: [facility] OR;  Service: Orthopedics;  Laterality: Left;   FRACTURE SURGERY     INCISION AND DRAINAGE HIP Left 01/03/2016   Procedure: IRRIGATION AND DEBRIDEMENT HIP;  Surgeon: Eldred Manges, MD;  Location: [facility] OR;  Service: Orthopedics;  Laterality: Left;   SHOULDER SURGERY Bilateral    "don't remember which side"   HPI:  Pt is an 82 yo female presenting from home to ED with weakness, dizziness, difficulty ambulating x 2 months, and suspected recurrent ESBL UTI. MRI Brain revealed small acute infarct in R postcentral gyrus and large area of L temporal encephalomalacia correlating with history of remote herpes encephalitis. PMH includes afib, CKD stage III, insulin-dependent DM2, CHF with reduced EF, HTN, HLD   Assessment / Plan / Recommendation Clinical Impression  Pt reports no noted acute differences in cognition, although notes a hoarse vocal quality. She reports handling all finances and medications independently and helping to care for her great granddaughter at home. SLP attempted to administer portions of the SLUMS, although note pt required increased verbal cueing to attend to questions. She was oriented to person, place, and situation, although was disoriented to time with cues. Pt exhibits difficutlies with sustained attention, storage > retrieval memory, and problem solving. SLP offered measures to make pt more comfortable, which pt denied, perseverating on the fact that her husband would help her later. Her social conversation appeared intact, as she frequently made jokes and responded appropriately to questions. She held her eyes closed throughout the entirety of the session, which likely further  impacted attention. SLP will continue to f/u as able to further assess cognition through treatment activites.    SLP Assessment  SLP Recommendation/Assessment: Patient needs continued Speech Lanaguage Pathology Services SLP Visit Diagnosis: Cognitive communication deficit (R41.841)    Recommendations for follow up therapy are one component of a multi-disciplinary discharge planning process, led by the attending physician.  Recommendations may be updated based on patient status, additional functional criteria and insurance authorization.    Follow Up Recommendations  Acute inpatient rehab (3hours/day)    Assistance Recommended at Discharge  Frequent or constant Supervision/Assistance  Functional Status Assessment Patient has had a recent decline in their functional status and demonstrates the ability to make significant improvements in function in a reasonable and predictable amount of time.  Frequency and Duration min 2x/week  2 weeks      SLP Evaluation Cognition  Overall Cognitive Status: No family/caregiver present to determine baseline cognitive functioning Arousal/Alertness: Awake/alert Orientation Level: Oriented to person;Oriented to place;Oriented to situation Day of Week: Incorrect Attention: Sustained Sustained Attention: Impaired Sustained Attention Impairment: Verbal basic Memory: Impaired Memory Impairment: Storage deficit Awareness: Impaired Awareness Impairment: Intellectual impairment Problem Solving: Impaired Problem Solving Impairment: Verbal basic Safety/Judgment: Impaired       Comprehension  Auditory Comprehension Overall Auditory Comprehension: Appears within functional limits for tasks assessed Conversation: Simple Visual Recognition/Discrimination Discrimination: Not tested Reading Comprehension  Reading Status: Not tested    Expression Expression Primary Mode of Expression: Verbal Verbal Expression Overall Verbal Expression: Appears within  functional limits for tasks assessed   Oral / Motor  Oral Motor/Sensory Function Overall Oral Motor/Sensory Function: Within functional limits Motor Speech Overall Motor Speech: Impaired Phonation: Hoarse            Gwynneth Aliment, M.A., CF-SLP Speech Language Pathology, Acute Rehabilitation Services  Secure Chat preferred [phone]  09/07/2022, 10:13 AM

## 2022-09-07 NOTE — Consult Note (Signed)
   [facility] CM Inpatient Consult   09/07/2022  [name] [name] 1941-01-17 [id number]  [facility] [[facility]]  Accountable Care Organization [ACO] Patient: Medicare ACO REACH  Primary Care Provider:  [name], MD   Patient is currently active with [facility] [[facility]] Care Management for chronic disease management services.  Patient has been recently been engaged by a [employer].  Our community based plan of care has focused on disease management and community resource support.  On unit rounds patient admitted with CVA.   Plan:  Patient for upcoming testing for ongoing work up. Continue to follow, as current disposition and post hospital needs are not known.  Of note, [facility] Care Management services does not replace or interfere with any services that are needed or arranged by inpatient [facility] care management team.   For additional questions or referrals please contact:  Charlesetta Shanks, RN BSN CCM [facility]  [phone] business mobile phone Toll free office [phone]  *Concierge Line  [phone] Fax number: [phone] [name]

## 2022-09-07 NOTE — Progress Notes (Signed)
[facility]                                                                              [place] [name] [name], is a 82 y.o. female, DOB - 08-22-1940, ZOX:[id number] Admit date - 09/06/2022    Outpatient Primary MD for the patient is [name], MD  LOS - 1  days  Chief Complaint  Patient presents with   Weakness   Dizziness       Brief summary   Patient is a 82 year old female with A-fib, on Eliquis, CKD stage IV, IDDM, chronic systolic CHF, HTN, HLP, HSV encephalitis in 2014, recurrent UTIs presented with weakness, dizziness.  Patient presented from home, lives with her husband, reported that over the last couple of weeks she has been feeling very weak, shaky and hard time walking even short distances.  No fevers, nausea or vomiting.  At baseline ambulates with a walker however felt very weak to ambulate.  In ED, noted to be hyperglycemic CBG 496, potassium 5.4, sodium 125, creatinine 4.5, WBC 26, hemoglobin 8.  Baseline creatinine around 2.9 UA positive for UTI MRI brain showed small acute infarct in the postcentral gyrus on the right, large area of left temporal encephalomalacia correlating with history of remote herpes encephalitis. Patient was admitted for further workup.  Neurology consulted.   Assessment & Plan    Principal Problem:   Acute CVA (cerebrovascular accident) (HCC) -Presented with generalized weakness, dizziness - MRI brain showed small acute infarct in the postcentral gyrus on the right, large area of left temporal encephalomalacia correlating with history of remote herpes encephalitis. -MRA head and neck with no evidence of hemodynamically significant stenosis, occlusion or dissection. -Recent 2D echo 08/25/2022 showed EF of 35 to 40%, global hypokinesis -Lipid panel showed cholesterol 35, HDL less than 10, LDL not calculated, triglycerides 65 -Neurology following, continue eliquis -Hemoglobin A1c 9.7 -PT OT, ST evaluation  Active Problems: UTI   (urinary tract infection) , bacteremia -Prior history of ESBL UTI, blood cultures 1/4 shows Enterobacter, Klebsiella -Continue IV meropenem, follow sensitivities -Repeat blood cultures, lactic acid normal, procalcitonin 1.98  Normocytic anemia, acute on chronic -Baseline hemoglobin ~8 -Iron days on 6/9 had shown  Fe 33, ferritin 177, percent saturation ratio 16, TIBC 213 -B12 1280, folate 11.6 -Noted hyperkalemia, hyponatremia, CKD trending up, likely anemia worsening due to CKD -Will transfuse 1 unit packed RBCs, follow FOBT  Leukocytosis  -Possibly due to UTI, bacteremia, WBCs 26.7 on admission, has chronic leukocytosis and 08/2022 -WBC is trending down, follow blood cultures, urine culture and sensitivities -Continue IV meropenem  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) stage 4, GFR 15-29 ml/min (HCC) -Baseline creatinine 3.1-3.6.  Creatinine was 3.1 on 08/24/2022 -Presenting with hyponatremia, hyperkalemia, creatinine 4.55 on admission -Obtain renal ultrasound, will consult nephrology, ESA per nephrology  External hemorrhoid -Currently painful, examined, not thrombosed, no active bleeding -Added Preparation H ointment, Tucks pads  Diabetes mellitus type 2, IDDM, complicated with CKD stage IV, uncontrolled with hyperglycemia -Hemoglobin A1c 9.7, presented with CBG of 496 -On NPH insulin 70/30, increased to 10 units twice daily, added SSI CBG (last 3)  Recent Labs  09/06/22 1718 09/06/22 2206 09/07/22 0618  GLUCAP 379* 270* 242*   -Diabetic coordinator consult  Chronic systolic CHF - Recent 2D echo 1/61/0960 showed EF of 35 to 40%, global hypokinesis -Follows cardiology outpatient -No ACE/ARB secondary to CKD stage IV, AKI.  -Creatinine worsened hence receiving gentle hydration, avoid fluid overload.  Currently euvolemic    Hyperlipidemia -Continue atorvastatin, Zetia    Essential hypertension -BP stable, continue Toprol-XL    Atrial fibrillation (HCC) -Rate  controlled, continue amiodarone, Toprol-XL -Continue eliquis   Estimated body mass index is 24.74 kg/m as calculated from the following:   Height as of this encounter: 5\' 9"  (1.753 m).   Weight as of this encounter: 76 kg.  Code Status: Full code DVT Prophylaxis:  apixaban (ELIQUIS) tablet 2.5 mg Start: 09/06/22 1500 Place and maintain sequential compression device Start: 09/06/22 1256 apixaban (ELIQUIS) tablet 2.5 mg   Level of Care: Level of care: Progressive Family Communication: Updated patient Disposition Plan:      Remains inpatient appropriate: Workup in progress   Procedures:  None  Consultants:   Neurology Nephrology  Antimicrobials:   Anti-infectives (From admission, onward)    Start     Dose/Rate Route Frequency Ordered Stop   09/06/22 1400  meropenem (MERREM) 1 g in sodium chloride 0.9 % 100 mL IVPB        1 g 200 mL/hr over 30 Minutes Intravenous Every 24 hours 09/06/22 1254            Medications   stroke: early stages of recovery book   Does not apply Once   amiodarone  200 mg Oral Daily   apixaban  2.5 mg Oral BID   atorvastatin  80 mg Oral Daily   ezetimibe  10 mg Oral Daily   insulin aspart  0-15 Units Subcutaneous TID WC   insulin aspart  0-5 Units Subcutaneous QHS   insulin aspart protamine- aspart  5 Units Subcutaneous BID WC   metoprolol succinate  50 mg Oral Daily      Subjective:   [name] [name] was seen and examined today.   Feeling weak, dizzy.  No acute chest pain, shortness of breath, fevers, nausea or vomiting.  No acute events overnight.  Objective:   Vitals:   09/06/22 2035 09/07/22 0050 09/07/22 0401 09/07/22 0743  BP:  (!) 119/46 (!) 115/45 (!) 119/54  Pulse:  (!) 56 (!) 53 (!) 55  Resp:  20 15 18   Temp:  98.2 F (36.8 C) 98.4 F (36.9 C) 97.9 F (36.6 C)  TempSrc:  Oral Oral Oral  SpO2:  100% 100% 91%  Weight: 76 kg     Height: 5\' 9"  (1.753 m)       Intake/Output Summary (Last 24 hours) at 09/07/2022  0919 Last data filed at 09/07/2022 0700 Gross per 24 hour  Intake 830 ml  Output 200 ml  Net 630 ml     Wt Readings from Last 3 Encounters:  09/06/22 76 kg  08/25/22 75.7 kg  08/12/22 79.8 kg     Exam General: Alert and oriented x 3, NAD Cardiovascular: S1 S2 auscultated,  RRR Respiratory: Diminished breath sounds at the bases, fairly clear, no rales or wheezing Gastrointestinal: Soft, nontender, nondistended, + bowel sounds Ext: no pedal edema bilaterally Neuro: Strength 5/5 upper and lower extremities bilaterally Psych: Normal affect  GU: External hemorrhoid, painful, no evidence of thrombosis or active bleeding    Data Reviewed:  I have personally reviewed following labs  CBC Lab Results  Component Value Date   WBC 23.7 (H) 09/07/2022   RBC 2.52 (L) 09/07/2022   HGB 7.1 (L) 09/07/2022   HCT 21.6 (L) 09/07/2022   MCV 85.7 09/07/2022   MCH 28.2 09/07/2022   PLT 448 (H) 09/07/2022   MCHC 32.9 09/07/2022   RDW 17.0 (H) 09/07/2022   LYMPHSABS 0.5 (L) 09/06/2022   MONOABS 0.5 09/06/2022   EOSABS 0.3 09/06/2022   BASOSABS 0.0 09/06/2022     Last metabolic panel Lab Results  Component Value Date   NA 128 (L) 09/07/2022   K 5.4 (H) 09/07/2022   CL 93 (L) 09/07/2022   CO2 23 09/07/2022   BUN 78 (H) 09/07/2022   CREATININE 4.61 (H) 09/07/2022   GLUCOSE 236 (H) 09/07/2022   GFRNONAA 9 (L) 09/07/2022   GFRAA 26 (L) 07/20/2018   CALCIUM 8.2 (L) 09/07/2022   PHOS 2.5 08/04/2022   PROT 6.7 09/06/2022   ALBUMIN 1.8 (L) 09/06/2022   LABGLOB 3.0 06/17/2021   AGRATIO 1.2 06/17/2021   BILITOT 0.8 09/06/2022   ALKPHOS 128 (H) 09/06/2022   AST 20 09/06/2022   ALT 17 09/06/2022   ANIONGAP 12 09/07/2022    CBG (last 3)  Recent Labs    09/06/22 1718 09/06/22 2206 09/07/22 0618  GLUCAP 379* 270* 242*      Coagulation Profile: Recent Labs  Lab 09/06/22 1001  INR 2.0*     Radiology Studies: I have personally reviewed the imaging studies  MR ANGIO  NECK W WO CONTRAST  Result Date: 09/06/2022 CLINICAL DATA:  Infarct seen on noncontrast MRI EXAM: MRA NECK WITHOUT AND WITH CONTRAST MRA HEAD WITHOUT CONTRAST TECHNIQUE: Multiplanar and multiecho pulse sequences of the neck were obtained without and with intravenous contrast. Angiographic images of the neck were obtained using MRA technique without and with intravenous contrast; Angiographic images of the Circle of Willis were obtained using MRA technique without intravenous contrast. CONTRAST:  7mL GADAVIST GADOBUTROL 1 MMOL/ML IV SOLN COMPARISON:  None Available. FINDINGS: MRA NECK FINDINGS Aortic arch: The imaged aortic arch is normal. The origins of the major branch vessels are patent. The subclavian arteries are patent to the level imaged. Right carotid system: The right common, internal, and external carotid arteries are patent, with mild irregularity at the bulb likely reflecting atherosclerotic disease but no evidence of hemodynamically significant stenosis or occlusion. There is no evidence of dissection or aneurysm. Left carotid system: The left common, internal, and external carotid arteries are patent, with mild irregularity at the bulb likely reflecting atherosclerotic disease but no evidence of hemodynamically significant stenosis or occlusion there is no evidence of dissection or aneurysm Vertebral arteries: The vertebral artery origins are not well assessed. Otherwise, the vertebral arteries are patent with antegrade flow. There is no evidence of hemodynamically significant stenosis or occlusion there is no evidence of dissection or aneurysm. Other: None. MRA HEAD FINDINGS Image quality is degraded by motion artifact at the level of the cavernous ICAs. Anterior circulation: The intracranial ICAs are patent, without significant stenosis or occlusion. The MCAs are patent, without proximal high-grade stenosis or occlusion. The bilateral ACAS are patent, without proximal stenosis or occlusion. There is  no definite aneurysm. Posterior circulation: The bilateral V4 segments are patent. The basilar artery is patent. The major cerebellar arteries appear patent. The bilateral PCAs are patent, without proximal high-grade stenosis or occlusion. A left posterior communicating artery is identified. There is no definite aneurysm. Anatomic variants: None. Other: None. IMPRESSION: Patent vasculature of  the head and neck without evidence of hemodynamically significant stenosis, occlusion, or dissection. Electronically Signed   By: Lesia Hausen M.D.   On: 09/06/2022 17:14   MR ANGIO HEAD WO CONTRAST  Result Date: 09/06/2022 CLINICAL DATA:  Infarct seen on noncontrast MRI EXAM: MRA NECK WITHOUT AND WITH CONTRAST MRA HEAD WITHOUT CONTRAST TECHNIQUE: Multiplanar and multiecho pulse sequences of the neck were obtained without and with intravenous contrast. Angiographic images of the neck were obtained using MRA technique without and with intravenous contrast; Angiographic images of the Circle of Willis were obtained using MRA technique without intravenous contrast. CONTRAST:  7mL GADAVIST GADOBUTROL 1 MMOL/ML IV SOLN COMPARISON:  None Available. FINDINGS: MRA NECK FINDINGS Aortic arch: The imaged aortic arch is normal. The origins of the major branch vessels are patent. The subclavian arteries are patent to the level imaged. Right carotid system: The right common, internal, and external carotid arteries are patent, with mild irregularity at the bulb likely reflecting atherosclerotic disease but no evidence of hemodynamically significant stenosis or occlusion. There is no evidence of dissection or aneurysm. Left carotid system: The left common, internal, and external carotid arteries are patent, with mild irregularity at the bulb likely reflecting atherosclerotic disease but no evidence of hemodynamically significant stenosis or occlusion there is no evidence of dissection or aneurysm Vertebral arteries: The vertebral artery  origins are not well assessed. Otherwise, the vertebral arteries are patent with antegrade flow. There is no evidence of hemodynamically significant stenosis or occlusion there is no evidence of dissection or aneurysm. Other: None. MRA HEAD FINDINGS Image quality is degraded by motion artifact at the level of the cavernous ICAs. Anterior circulation: The intracranial ICAs are patent, without significant stenosis or occlusion. The MCAs are patent, without proximal high-grade stenosis or occlusion. The bilateral ACAS are patent, without proximal stenosis or occlusion. There is no definite aneurysm. Posterior circulation: The bilateral V4 segments are patent. The basilar artery is patent. The major cerebellar arteries appear patent. The bilateral PCAs are patent, without proximal high-grade stenosis or occlusion. A left posterior communicating artery is identified. There is no definite aneurysm. Anatomic variants: None. Other: None. IMPRESSION: Patent vasculature of the head and neck without evidence of hemodynamically significant stenosis, occlusion, or dissection. Electronically Signed   By: Lesia Hausen M.D.   On: 09/06/2022 17:14   MR BRAIN WO CONTRAST  Result Date: 09/06/2022 CLINICAL DATA:  Neuro deficit with acute stroke suspected EXAM: MRI HEAD WITHOUT CONTRAST TECHNIQUE: Multiplanar, multiecho pulse sequences of the brain and surrounding structures were obtained without intravenous contrast. 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 Small acute infarct in the postcentral gyrus on the right chronic small vessel ischemia to a moderate degree. No acute hemorrhage, hydrocephalus, or masslike finding Vascular: Normal flow voids. Skull and upper cervical spine: Normal marrow signal. Sinuses/Orbits: Negative. IMPRESSION: 1. Small acute infarct  in the postcentral gyrus on the right. 2. 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 Electronically Signed   By: Tiburcio Pea M.D.   On: 09/06/2022 11:40   DG Chest Portable 1 View  Result Date: 09/06/2022 CLINICAL DATA:  Pain EXAM: PORTABLE CHEST 1 VIEW COMPARISON:  08/07/2022 FINDINGS: Stable cardiomediastinal contours. Aortic atherosclerosis. Perihilar and bibasilar interstitial opacities. No pleural effusion or pneumothorax. IMPRESSION: Perihilar and bibasilar interstitial opacities, which may reflect edema or atypical  infection. Electronically Signed   By: Duanne Guess D.O.   On: 09/06/2022 10:14       Ajaya Crutchfield M.D. [facility] 09/07/2022, 9:19 AM  Available via Epic secure chat 7am-7pm After 7 pm, please refer to night coverage provider listed on amion.

## 2022-09-07 NOTE — Inpatient Diabetes Management (Signed)
Inpatient Diabetes Program Recommendations  AACE/ADA: New Consensus Statement on Inpatient Glycemic Control (2015)  Target Ranges:  Prepandial:   less than 140 mg/dL      Peak postprandial:   less than 180 mg/dL (1-2 hours)      Critically ill patients:  140 - 180 mg/dL   Lab Results  Component Value Date   GLUCAP 202 (H) 09/07/2022   HGBA1C 9.7 (H) 09/06/2022    Review of Glycemic Control  Diabetes history: DM 2 Outpatient Diabetes medications: 70/30 23 units qam and 5 units qpm Current orders for Inpatient glycemic control:  70/10 10 units bid Novolog 0-9 units tid  Consult for uncontrolled diabetes with A1c of 9.7% this admission and diagnosis of stroke.   Pt resting receiving blood at bedside. Will see this admission regarding A1c level and glucose control at home. Insulin dose adjusted today watch for now and consider further adjustments tomorrow.   Thanks,  Christena Deem RN, MSN, BC-ADM Inpatient Diabetes Coordinator Team Pager [phone] (8a-5p)

## 2022-09-07 NOTE — Telephone Encounter (Signed)
Did not realize when forms printed providers information not filled out Filled out and refaxed, confirmation received

## 2022-09-07 NOTE — Telephone Encounter (Signed)
Physician orders (order number: [id number]) signed and faxed back to [facility] at [phone]. Form sent for scanning.

## 2022-09-07 NOTE — Evaluation (Signed)
Physical Therapy Evaluation Patient Details Name: [name] [name] MRN: [id number] DOB: 1940-05-12 Today's Date: 09/07/2022  History of Present Illness  Pt is an 82 y/o female who presents s/p syncope and fall while walking to the car as family was attempting to bring her to the ED for vomiting, weakness. Of note, pt with recent admission with d/c 07/10/22 from [facility] for UTI and hematura with elevated INR (discharged with foley). PMH significant for A fib, L eye blindness, DM II, HTN, osteopenia, pelvic fracture 2018, seizures, systolic CHF, ankle fx surgery 2017, IM nail R in 2014 and L 2017, I&D hip 2017, B shoulder surgery.  Clinical Impression  Pt admitted with above diagnosis. Pt received in bed by OT and PT, has pain from hemorrhoids as well as generalized pain to touch and with all mvmt. This significantly limits her mobility and thus PT/OT eval. Pt required mod A +2 for bed mobility and sit<>stand transfer. Pt unable to tolerate prolonged standing and could not step feet to attempt ambulation today. Pt is from home with family support. Patient will benefit from continued inpatient follow up therapy, <3 hours/day unless family equipped to take her home and give 24 hr assist with 2 person physical assist. Pt currently with functional limitations due to the deficits listed below (see PT Problem List). Pt will benefit from acute skilled PT to increase their independence and safety with mobility to allow discharge.           Assistance Recommended at Discharge Frequent or constant Supervision/Assistance  If plan is discharge home, recommend the following:  Can travel by private vehicle  Two people to help with walking and/or transfers;A lot of help with bathing/dressing/bathroom;Assistance with cooking/housework;Direct supervision/assist for medications management;Direct supervision/assist for financial management;Help with stairs or ramp for entrance;Assist for transportation   No    Equipment  Recommendations None recommended by PT  Recommendations for Other Services       Functional Status Assessment Patient has had a recent decline in their functional status and demonstrates the ability to make significant improvements in function in a reasonable and predictable amount of time.     Precautions / Restrictions Precautions Precautions: Fall Precaution Comments: L eye blindness Restrictions Weight Bearing Restrictions: No      Mobility  Bed Mobility Overal bed mobility: Needs Assistance Bed Mobility: Supine to Sit, Sit to Supine     Supine to sit: Mod assist, +2 for physical assistance Sit to supine: Mod assist, +2 for physical assistance   General bed mobility comments: mod A for LE's off and back onto bed as well as mod A at trunk. Pt with increased pain in sitting    Transfers Overall transfer level: Needs assistance Equipment used: 2 person hand held assist Transfers: Sit to/from Stand Sit to Stand: Mod assist, +2 physical assistance           General transfer comment: mod A +2 to power up and to steady    Ambulation/Gait               General Gait Details: pt unable to tolerate today  Stairs            Wheelchair Mobility     Tilt Bed    Modified Rankin (Stroke Patients Only) Modified Rankin (Stroke Patients Only) Pre-Morbid Rankin Score: Moderately severe disability Modified Rankin: Severe disability     Balance Overall balance assessment: Needs assistance Sitting-balance support: Single extremity supported, Feet supported Sitting balance-Leahy Scale: Poor Sitting balance -  Comments: close guarding in sitting, posterior bias   Standing balance support: Bilateral upper extremity supported Standing balance-Leahy Scale: Zero Standing balance comment: needed mod A +2 to maintain standing. Denied dizziness but had increased pain. Could not tolerate continued standing or stepping feet                              Pertinent Vitals/Pain Pain Assessment Pain Assessment: Faces Faces Pain Scale: Hurts even more Pain Location: bottom (hemorrhoids) and generalized with all mvmt and to touch Pain Descriptors / Indicators: Aching, Discomfort, Grimacing, Guarding, Sore Pain Intervention(s): Limited activity within patient's tolerance, Monitored during session    Home Living Family/patient expects to be discharged to:: Private residence Living Arrangements: Spouse/significant other;Children;Other relatives Available Help at Discharge: Family;Available 24 hours/day Type of Home: House Home Access: Ramped entrance Entrance Stairs-Rails: None   Alternate Level Stairs-Number of Steps: flight Home Layout: Two level Home Equipment: Rollator (4 wheels);Shower seat - built in;Grab bars - tub/shower;Other (comment) (chair lift for stairs) Additional Comments: lives with spouse and children, grandchildren, etc    Prior Function Prior Level of Function : Needs assist             Mobility Comments: Rollator at baseline; spouse walks with pt for safety due to hx of falls. Pt reports she has been unable to ambulate for at least a week ADLs Comments: assist with bathing/dressing     Hand Dominance   Dominant Hand: Right    Extremity/Trunk Assessment   Upper Extremity Assessment Upper Extremity Assessment: Defer to OT evaluation    Lower Extremity Assessment Lower Extremity Assessment: Generalized weakness    Cervical / Trunk Assessment Cervical / Trunk Assessment: Kyphotic  Communication   Communication: No difficulties  Cognition Arousal/Alertness: Awake/alert Behavior During Therapy: WFL for tasks assessed/performed Overall Cognitive Status: No family/caregiver present to determine baseline cognitive functioning                                 General Comments: pt woken by lab just before therapy entered, initially seemed a bit confused but improved with increasing  alertness. Followed basic commands, possible STM deficits        General Comments      Exercises     Assessment/Plan    PT Assessment Patient needs continued PT services  PT Problem List Decreased strength;Decreased activity tolerance;Decreased balance;Decreased mobility;Decreased cognition;Decreased knowledge of precautions;Decreased knowledge of use of DME;Pain;Decreased skin integrity       PT Treatment Interventions DME instruction;Gait training;Functional mobility training;Therapeutic activities;Therapeutic exercise;Balance training;Patient/family education;Cognitive remediation;Neuromuscular re-education    PT Goals (Current goals can be found in the Care Plan section)  Acute Rehab PT Goals Patient Stated Goal: return home PT Goal Formulation: With patient Time For Goal Achievement: 09/21/22 Potential to Achieve Goals: Fair    Frequency Min 3X/week     Co-evaluation PT/OT/SLP Co-Evaluation/Treatment: Yes Reason for Co-Treatment: Complexity of the patient's impairments (multi-system involvement);Necessary to address cognition/behavior during functional activity;To address functional/ADL transfers PT goals addressed during session: Mobility/safety with mobility;Balance         AM-PAC PT "6 Clicks" Mobility  Outcome Measure Help needed turning from your back to your side while in a flat bed without using bedrails?: A Lot Help needed moving from lying on your back to sitting on the side of a flat bed without using bedrails?: Total Help needed moving to  and from a bed to a chair (including a wheelchair)?: Total Help needed standing up from a chair using your arms (e.g., wheelchair or bedside chair)?: Total Help needed to walk in hospital room?: Total Help needed climbing 3-5 steps with a railing? : Total 6 Click Score: 7    End of Session Equipment Utilized During Treatment: Gait belt Activity Tolerance: Patient limited by pain Patient left: in bed;with call  bell/phone within reach;with bed alarm set Nurse Communication: Mobility status PT Visit Diagnosis: Unsteadiness on feet (R26.81);Muscle weakness (generalized) (M62.81);Difficulty in walking, not elsewhere classified (R26.2);Pain Pain - part of body:  (buttocks)    Time: 1116-1140 PT Time Calculation (min) (ACUTE ONLY): 24 min   Charges:   PT Evaluation $PT Eval Moderate Complexity: 1 Mod   PT General Charges $$ ACUTE PT VISIT: 1 Visit         Lyanne Co, PT  Acute Rehab Services Secure chat preferred Office [phone]   Lawana Chambers Arra Connaughton 09/07/2022, 12:43 PM

## 2022-09-07 NOTE — ED Notes (Signed)
09/07/2022 0902 Lab called to notify pt has 1 + Blood Culture was gram Positive Rod. Dr Jacqulyn Bath aware and will let charge nurse on 3w know results.

## 2022-09-07 NOTE — Telephone Encounter (Signed)
Patient Advocate Encounter  Contacted [employer] for status update. Portions 3 and 4 of the application were not filled out. [employer] was unable to update office due to missing phone/ fax information. Please complete these sections of the application, along with the providers signature, and resend.

## 2022-09-07 NOTE — Evaluation (Signed)
Occupational Therapy Evaluation Patient Details Name: [name] [name] MRN: [id number] DOB: [birth year] Today's Date: 09/07/2022   History of Present Illness Pt is an 82 y/o female who presents s/p syncope and fall while walking to the car as family was attempting to bring her to the ED for vomiting, weakness. Of note, pt with recent admission with d/c 07/10/22 from [facility] for UTI and hematura with elevated INR (discharged with foley). PMH significant for A fib, L eye blindness, DM II, HTN, osteopenia, pelvic fracture 2018, seizures, systolic CHF, ankle fx surgery 2017, IM nail R in 2014 and L 2017, I&D hip 2017, B shoulder surgery.   Clinical Impression   PTA, pt lives with family, typically ambulatory using Rollator + assist due to hx of falls. Pt reports assist for LB ADLs and reports unable to get out of bed > 1 week. Pt presents now with hemorrhoid pain and deficits in strength, endurance and balance. Pt requiring Mod A x 2 for bed mobility and brief standing at bedside. Pt requires Mod A for UB ADL and Total A for LB ADLs bed level due to deficits. Patient will benefit from continued inpatient follow up therapy, <3 hours/day at DC unless family able to provide 24/7 physical assistance.      Recommendations for follow up therapy are one component of a multi-disciplinary discharge planning process, led by the attending physician.  Recommendations may be updated based on patient status, additional functional criteria and insurance authorization.   Assistance Recommended at Discharge Frequent or constant Supervision/Assistance  Patient can return home with the following Two people to help with walking and/or transfers;A lot of help with bathing/dressing/bathroom    Functional Status Assessment  Patient has had a recent decline in their functional status and demonstrates the ability to make significant improvements in function in a reasonable and predictable amount of time.  Equipment  Recommendations  Hospital bed;Wheelchair cushion (measurements OT);Wheelchair (measurements OT);BSC/3in1    Recommendations for Other Services       Precautions / Restrictions Precautions Precautions: Fall Precaution Comments: L eye blindness Restrictions Weight Bearing Restrictions: No      Mobility Bed Mobility Overal bed mobility: Needs Assistance Bed Mobility: Supine to Sit, Sit to Supine     Supine to sit: Mod assist, +2 for physical assistance Sit to supine: Mod assist, +2 for physical assistance   General bed mobility comments: mod A for LE's off and back onto bed as well as mod A at trunk. Pt with increased pain in sitting    Transfers Overall transfer level: Needs assistance Equipment used: 2 person hand held assist Transfers: Sit to/from Stand Sit to Stand: Mod assist, +2 physical assistance           General transfer comment: mod A +2 to power up and to steady      Balance Overall balance assessment: Needs assistance Sitting-balance support: Single extremity supported, Feet supported Sitting balance-Leahy Scale: Poor Sitting balance - Comments: close guarding in sitting, posterior bias   Standing balance support: Bilateral upper extremity supported Standing balance-Leahy Scale: Zero Standing balance comment: needed mod A +2 to maintain standing. Denied dizziness but had increased pain. Could not tolerate continued standing or stepping feet                           ADL either performed or assessed with clinical judgement   ADL Overall ADL's : Needs assistance/impaired Eating/Feeding: Set up;Minimal assistance Eating/Feeding Details (indicate cue  type and reason): some depth perception issues finding straw and bringing to mouth Grooming: Bed level;Sitting;Minimal assistance   Upper Body Bathing: Moderate assistance;Sitting   Lower Body Bathing: Total assistance;Bed level   Upper Body Dressing : Moderate assistance;Bed level   Lower  Body Dressing: Total assistance;Bed level       Toileting- Clothing Manipulation and Hygiene: Total assistance;Bed level               Vision Baseline Vision/History: 1 Wears glasses;2 Legally blind Ability to See in Adequate Light: 2 Moderately impaired Patient Visual Report: No change from baseline Vision Assessment?: Vision impaired- to be further tested in functional context Additional Comments: L eye blindness at baseline     Perception     Praxis      Pertinent Vitals/Pain Pain Assessment Pain Assessment: Faces Faces Pain Scale: Hurts even more Pain Location: bottom (hemorrhoids) and generalized with all mvmt and to touch Pain Descriptors / Indicators: Aching, Discomfort, Grimacing, Guarding, Sore Pain Intervention(s): Monitored during session     Hand Dominance Right   Extremity/Trunk Assessment Upper Extremity Assessment Upper Extremity Assessment: Generalized weakness   Lower Extremity Assessment Lower Extremity Assessment: Defer to PT evaluation   Cervical / Trunk Assessment Cervical / Trunk Assessment: Kyphotic   Communication Communication Communication: No difficulties   Cognition Arousal/Alertness: Awake/alert Behavior During Therapy: WFL for tasks assessed/performed Overall Cognitive Status: No family/caregiver present to determine baseline cognitive functioning                                 General Comments: pt woken by lab just before therapy entered, initially seemed a bit confused but improved with increasing alertness. Followed basic commands, possible STM deficits and some difficulty discussing deficits     General Comments       Exercises     Shoulder Instructions      Home Living Family/patient expects to be discharged to:: Private residence Living Arrangements: Spouse/significant other;Children;Other relatives Available Help at Discharge: Family;Available 24 hours/day Type of Home: House Home Access: Ramped  entrance   Entrance Stairs-Rails: None Home Layout: Two level Alternate Level Stairs-Number of Steps: flight   Bathroom Shower/Tub: Producer, television/film/video: Standard     Home Equipment: Rollator (4 wheels);Shower seat - built in;Grab bars - tub/shower;Other (comment) (chair lift for stairs)   Additional Comments: lives with spouse and children, grandchildren, etc  Lives With: Spouse;Family    Prior Functioning/Environment Prior Level of Function : Needs assist             Mobility Comments: Rollator at baseline; spouse walks with pt for safety due to hx of falls. Pt reports she has been unable to ambulate for at least a week ADLs Comments: assist with bathing/dressing        OT Problem List: Decreased strength;Decreased activity tolerance;Impaired balance (sitting and/or standing);Decreased cognition;Decreased safety awareness;Decreased knowledge of precautions;Decreased knowledge of use of DME or AE      OT Treatment/Interventions: Self-care/ADL training;Therapeutic exercise;Energy conservation;DME and/or AE instruction;Therapeutic activities;Balance training;Patient/family education    OT Goals(Current goals can be found in the care plan section) Acute Rehab OT Goals Patient Stated Goal: decrease bottom pain OT Goal Formulation: With patient Time For Goal Achievement: 09/21/22 Potential to Achieve Goals: Fair  OT Frequency: Min 2X/week    Co-evaluation PT/OT/SLP Co-Evaluation/Treatment: Yes Reason for Co-Treatment: Complexity of the patient's impairments (multi-system involvement);Necessary to address cognition/behavior during functional activity;To address  functional/ADL transfers PT goals addressed during session: Mobility/safety with mobility;Balance OT goals addressed during session: ADL's and self-care      AM-PAC OT "6 Clicks" Daily Activity     Outcome Measure Help from another person eating meals?: A Little Help from another person taking care of  personal grooming?: A Little Help from another person toileting, which includes using toliet, bedpan, or urinal?: Total Help from another person bathing (including washing, rinsing, drying)?: A Lot Help from another person to put on and taking off regular upper body clothing?: A Lot Help from another person to put on and taking off regular lower body clothing?: Total 6 Click Score: 12   End of Session    Activity Tolerance: Patient limited by fatigue Patient left: in bed;with call bell/phone within reach;with bed alarm set  OT Visit Diagnosis: Unsteadiness on feet (R26.81);Other abnormalities of gait and mobility (R26.89);Muscle weakness (generalized) (M62.81)                Time: 6578-4696 OT Time Calculation (min): 26 min Charges:  OT General Charges $OT Visit: 1 Visit OT Evaluation $OT Eval Moderate Complexity: 1 Mod  Bradd Canary, OTR/L Acute Rehab Services Office: [phone]   Lorre Munroe 09/07/2022, 1:54 PM

## 2022-09-07 NOTE — ED Notes (Signed)
09/07/2022 0913 spoke with Tiffany at [facility] who verified the attending doctor is aware.

## 2022-09-08 DIAGNOSIS — I639 Cerebral infarction, unspecified: Secondary | ICD-10-CM | POA: Diagnosis not present

## 2022-09-08 DIAGNOSIS — E1165 Type 2 diabetes mellitus with hyperglycemia: Secondary | ICD-10-CM | POA: Diagnosis not present

## 2022-09-08 DIAGNOSIS — N3001 Acute cystitis with hematuria: Secondary | ICD-10-CM | POA: Diagnosis not present

## 2022-09-08 LAB — LDL CHOLESTEROL, DIRECT: Direct LDL: <10 mg/dL (ref 0–99)

## 2022-09-08 LAB — GLUCOSE, CAPILLARY
Glucose-Capillary: 164 mg/dL — ABNORMAL HIGH (ref 70–99)
Glucose-Capillary: 173 mg/dL — ABNORMAL HIGH (ref 70–99)
Glucose-Capillary: 231 mg/dL — ABNORMAL HIGH (ref 70–99)

## 2022-09-08 LAB — CBC
HCT: 24.6 % — ABNORMAL LOW (ref 36.0–46.0)
Hemoglobin: 8.1 g/dL — ABNORMAL LOW (ref 12.0–15.0)
MCH: 27.6 pg (ref 26.0–34.0)
MCHC: 32.9 g/dL (ref 30.0–36.0)
MCV: 83.7 fL (ref 80.0–100.0)
Platelets: 402 10*3/uL — ABNORMAL HIGH (ref 150–400)
RBC: 2.94 MIL/uL — ABNORMAL LOW (ref 3.87–5.11)
RDW: 16.5 % — ABNORMAL HIGH (ref 11.5–15.5)
WBC: 20.5 10*3/uL — ABNORMAL HIGH (ref 4.0–10.5)
nRBC: 0 % (ref 0.0–0.2)

## 2022-09-08 LAB — BASIC METABOLIC PANEL
Anion gap: 8 (ref 5–15)
BUN: 75 mg/dL — ABNORMAL HIGH (ref 8–23)
CO2: 22 mmol/L (ref 22–32)
Calcium: 7.9 mg/dL — ABNORMAL LOW (ref 8.9–10.3)
Chloride: 96 mmol/L — ABNORMAL LOW (ref 98–111)
Creatinine, Ser: 4.7 mg/dL — ABNORMAL HIGH (ref 0.44–1.00)
GFR, Estimated: 9 mL/min — ABNORMAL LOW (ref >60)
Glucose, Bld: 169 mg/dL — ABNORMAL HIGH (ref 70–99)
Potassium: 4.7 mmol/L (ref 3.5–5.1)
Sodium: 126 mmol/L — ABNORMAL LOW (ref 135–145)

## 2022-09-08 LAB — TYPE AND SCREEN
ABO/RH(D): A NEG
Antibody Screen: NEGATIVE
Unit division: 0

## 2022-09-08 LAB — BPAM RBC
Blood Product Expiration Date: 202407182359
ISSUE DATE / TIME: 202407081403
Unit Type and Rh: 600

## 2022-09-08 LAB — URINE CULTURE: Culture: >=100000 — AB

## 2022-09-08 LAB — IRON AND TIBC
Iron: 20 ug/dL — ABNORMAL LOW (ref 28–170)
Saturation Ratios: 13 % (ref 10.4–31.8)
TIBC: 157 ug/dL — ABNORMAL LOW (ref 250–450)
UIBC: 137 ug/dL

## 2022-09-08 LAB — FERRITIN: Ferritin: 136 ng/mL (ref 11–307)

## 2022-09-08 LAB — CULTURE, BLOOD (ROUTINE X 2)

## 2022-09-08 MED ORDER — METOPROLOL SUCCINATE ER 50 MG PO TB24
50.0000 mg | ORAL_TABLET | Freq: Every day | ORAL | Status: DC
  Administered 2022-09-08: 50 mg via ORAL
  Filled 2022-09-08: qty 1

## 2022-09-08 MED ORDER — SODIUM ZIRCONIUM CYCLOSILICATE 10 G PO PACK
10.0000 g | PACK | Freq: Once | ORAL | Status: AC
  Administered 2022-09-08: 10 g via ORAL
  Filled 2022-09-08: qty 1

## 2022-09-08 MED ORDER — SODIUM CHLORIDE 0.9 % IV SOLN
250.0000 mg | Freq: Every day | INTRAVENOUS | Status: AC
  Administered 2022-09-08 – 2022-09-11 (×4): 250 mg via INTRAVENOUS
  Filled 2022-09-08 (×5): qty 20

## 2022-09-08 MED ORDER — METOPROLOL SUCCINATE ER 50 MG PO TB24: 50.0000 mg | ORAL_TABLET | Freq: Every day | ORAL | Status: DC

## 2022-09-08 MED ORDER — METOPROLOL SUCCINATE ER 25 MG PO TB24
25.0000 mg | ORAL_TABLET | Freq: Every day | ORAL | Status: DC
  Administered 2022-09-09 – 2022-09-16 (×8): 25 mg via ORAL
  Filled 2022-09-08 (×9): qty 1

## 2022-09-08 MED ORDER — INSULIN ASPART PROT & ASPART (70-30 MIX) 100 UNIT/ML ~~LOC~~ SUSP
13.0000 [IU] | Freq: Two times a day (BID) | SUBCUTANEOUS | Status: DC
  Administered 2022-09-08 – 2022-09-09 (×2): 13 [IU] via SUBCUTANEOUS
  Filled 2022-09-08: qty 10

## 2022-09-08 NOTE — Plan of Care (Signed)
  Problem: Fluid Volume: Goal: Ability to maintain a balanced intake and output will improve Outcome: Progressing   Problem: Coping: Goal: Will identify appropriate support needs Outcome: Progressing   Problem: Self-Care: Goal: Ability to communicate needs accurately will improve Outcome: Progressing   Problem: Nutrition: Goal: Risk of aspiration will decrease Outcome: Progressing   Problem: Activity: Goal: Risk for activity intolerance will decrease Outcome: Progressing   Problem: Coping: Goal: Ability to adjust to condition or change in health will improve Outcome: Not Progressing   Problem: Health Behavior/Discharge Planning: Goal: Ability to manage health-related needs will improve Outcome: Not Progressing   Problem: Nutritional: Goal: Progress toward achieving an optimal weight will improve Outcome: Not Progressing

## 2022-09-08 NOTE — Plan of Care (Signed)
  Problem: Coping: Goal: Ability to adjust to condition or change in health will improve Outcome: Progressing   Problem: Fluid Volume: Goal: Ability to maintain a balanced intake and output will improve Outcome: Progressing   Problem: Nutritional: Goal: Maintenance of adequate nutrition will improve Outcome: Progressing   Problem: Skin Integrity: Goal: Risk for impaired skin integrity will decrease Outcome: Progressing   Problem: Education: Goal: Knowledge of disease or condition will improve Outcome: Progressing   Problem: Coping: Goal: Will identify appropriate support needs Outcome: Progressing   Problem: Self-Care: Goal: Ability to participate in self-care as condition permits will improve Outcome: Not Progressing

## 2022-09-08 NOTE — Progress Notes (Addendum)
[facility] Progress Note   Subjective:   up in chair eating, feeling much improved.  No LUTs, no dyspnea, orthopnea.    Objective Vitals:   09/07/22 2039 09/08/22 0025 09/08/22 0427 09/08/22 0812  BP: (!) 131/49 (!) 130/54 (!) 137/49 (!) 153/61  Pulse: (!) 52 (!) 52 (!) 51 (!) 51  Resp: 18 18 18 16   Temp: 98.4 F (36.9 C) 97.9 F (36.6 C) (!) 97.5 F (36.4 C) (!) 97.4 F (36.3 C)  TempSrc: Oral Oral Oral Oral  SpO2: 98% 93% 94% 93%  Weight:      Height:       Physical Exam I/Os 743 / 400 Gen: sitting up eating good breakfast Eyes: anicteric, EOMI ENT: MMM Neck: no JVD CV:  brady and regular, no rub Abd: soft, nontender Back:CTAB GU: purewick with yellow urine in cannister  Neuro: conversant, seems to trail off in details when discussing med history - much more alert than yest Skin: no rashes noted  Additional Objective Labs: Basic Metabolic Panel: Recent Labs  Lab 09/06/22 0944 09/07/22 0644 09/08/22 0244  NA 125* 128* 126*  K 5.4* 5.4* 4.7  CL 92* 93* 96*  CO2 24 23 22   GLUCOSE 496* 236* 169*  BUN 76* 78* 75*  CREATININE 4.55* 4.61* 4.70*  CALCIUM 8.3* 8.2* 7.9*   Liver Function Tests: Recent Labs  Lab 09/06/22 0944  AST 20  ALT 17  ALKPHOS 128*  BILITOT 0.8  PROT 6.7  ALBUMIN 1.8*   No results for input(s): "LIPASE", "AMYLASE" in the last 168 hours. CBC: Recent Labs  Lab 09/06/22 0944 09/07/22 0644 09/08/22 0244  WBC 26.7* 23.7* 20.5*  NEUTROABS 25.4*  --   --   HGB 8.0* 7.1* 8.1*  HCT 24.9* 21.6* 24.6*  MCV 85.9 85.7 83.7  PLT 495* 448* 402*   Blood Culture    Component Value Date/Time   SDES BLOOD LEFT ARM 09/07/2022 0644   SPECREQUEST  09/07/2022 0644    BOTTLES DRAWN AEROBIC AND ANAEROBIC Blood Culture adequate volume   CULT  09/07/2022 0644    NO GROWTH < 24 HOURS Performed at [facility], [address] [place], [place] [address]    REPTSTATUS PENDING 09/07/2022 [phone]    Cardiac Enzymes: No  results for input(s): "CKTOTAL", "CKMB", "CKMBINDEX", "TROPONINI" in the last 168 hours. CBG: Recent Labs  Lab 09/06/22 2206 09/07/22 0618 09/07/22 1141 09/07/22 1617 09/08/22 0638  GLUCAP 270* 242* 202* 185* 164*   Iron Studies:  Recent Labs    09/08/22 0244  IRON 20*  TIBC 157*  FERRITIN 136   @[email or web address] @ Studies/Results: US RENAL  Result Date: 09/07/2022 CLINICAL DATA:  Chronic kidney disease stage 3 Diabetes Hypertension EXAM: RENAL / URINARY TRACT ULTRASOUND COMPLETE COMPARISON:  CT chest abdomen pelvis 07/20/2022 FINDINGS: Right Kidney: Renal measurements: 9.2 x 3.8 x 6.0 cm = volume: 109 mL. Echogenicity within normal limits. No mass or hydronephrosis visualized. 2.8 cm simple cyst in the upper pole does not require dedicated imaging follow-up. 1.5 cm hypoechoic structure in the lower pole of the right kidney does not appear anechoic. Left Kidney: Renal measurements: 10.8 x 6.0 x 5.3 cm = volume: 179 mL. Echogenicity within normal limits. No mass. Mild left hydronephrosis, similar to prior exam. 3.4 cm simple cyst in the lower pole does not require dedicated imaging follow-up. Bladder: Evaluation limited due to under distension. Other: None. IMPRESSION: 1. Unchanged mild left hydronephrosis. 2. 1.5 cm hypoechoic structure in the lower pole  of the right kidney can not be characterized as a simple cyst given its internal echoes. It is more likely to be a mildly complicated cyst with proteinaceous or hemorrhagic debris thin a neoplasm. Recommend follow-up ultrasound in 6 months to again evaluate this finding. Electronically Signed   By: Acquanetta Belling M.D.   On: 09/07/2022 11:52   MR ANGIO NECK W WO CONTRAST  Result Date: 09/06/2022 CLINICAL DATA:  Infarct seen on noncontrast MRI EXAM: MRA NECK WITHOUT AND WITH CONTRAST MRA HEAD WITHOUT CONTRAST TECHNIQUE: Multiplanar and multiecho pulse sequences of the neck were obtained without and with intravenous contrast. Angiographic images of the  neck were obtained using MRA technique without and with intravenous contrast; Angiographic images of the Circle of Willis were obtained using MRA technique without intravenous contrast. CONTRAST:  7mL GADAVIST GADOBUTROL 1 MMOL/ML IV SOLN COMPARISON:  None Available. FINDINGS: MRA NECK FINDINGS Aortic arch: The imaged aortic arch is normal. The origins of the major branch vessels are patent. The subclavian arteries are patent to the level imaged. Right carotid system: The right common, internal, and external carotid arteries are patent, with mild irregularity at the bulb likely reflecting atherosclerotic disease but no evidence of hemodynamically significant stenosis or occlusion. There is no evidence of dissection or aneurysm. Left carotid system: The left common, internal, and external carotid arteries are patent, with mild irregularity at the bulb likely reflecting atherosclerotic disease but no evidence of hemodynamically significant stenosis or occlusion there is no evidence of dissection or aneurysm Vertebral arteries: The vertebral artery origins are not well assessed. Otherwise, the vertebral arteries are patent with antegrade flow. There is no evidence of hemodynamically significant stenosis or occlusion there is no evidence of dissection or aneurysm. Other: None. MRA HEAD FINDINGS Image quality is degraded by motion artifact at the level of the cavernous ICAs. Anterior circulation: The intracranial ICAs are patent, without significant stenosis or occlusion. The MCAs are patent, without proximal high-grade stenosis or occlusion. The bilateral ACAS are patent, without proximal stenosis or occlusion. There is no definite aneurysm. Posterior circulation: The bilateral V4 segments are patent. The basilar artery is patent. The major cerebellar arteries appear patent. The bilateral PCAs are patent, without proximal high-grade stenosis or occlusion. A left posterior communicating artery is identified. There is no  definite aneurysm. Anatomic variants: None. Other: None. IMPRESSION: Patent vasculature of the head and neck without evidence of hemodynamically significant stenosis, occlusion, or dissection. Electronically Signed   By: Lesia Hausen M.D.   On: 09/06/2022 17:14   MR ANGIO HEAD WO CONTRAST  Result Date: 09/06/2022 CLINICAL DATA:  Infarct seen on noncontrast MRI EXAM: MRA NECK WITHOUT AND WITH CONTRAST MRA HEAD WITHOUT CONTRAST TECHNIQUE: Multiplanar and multiecho pulse sequences of the neck were obtained without and with intravenous contrast. Angiographic images of the neck were obtained using MRA technique without and with intravenous contrast; Angiographic images of the Circle of Willis were obtained using MRA technique without intravenous contrast. CONTRAST:  7mL GADAVIST GADOBUTROL 1 MMOL/ML IV SOLN COMPARISON:  None Available. FINDINGS: MRA NECK FINDINGS Aortic arch: The imaged aortic arch is normal. The origins of the major branch vessels are patent. The subclavian arteries are patent to the level imaged. Right carotid system: The right common, internal, and external carotid arteries are patent, with mild irregularity at the bulb likely reflecting atherosclerotic disease but no evidence of hemodynamically significant stenosis or occlusion. There is no evidence of dissection or aneurysm. Left carotid system: The left common, internal, and external  carotid arteries are patent, with mild irregularity at the bulb likely reflecting atherosclerotic disease but no evidence of hemodynamically significant stenosis or occlusion there is no evidence of dissection or aneurysm Vertebral arteries: The vertebral artery origins are not well assessed. Otherwise, the vertebral arteries are patent with antegrade flow. There is no evidence of hemodynamically significant stenosis or occlusion there is no evidence of dissection or aneurysm. Other: None. MRA HEAD FINDINGS Image quality is degraded by motion artifact at the level  of the cavernous ICAs. Anterior circulation: The intracranial ICAs are patent, without significant stenosis or occlusion. The MCAs are patent, without proximal high-grade stenosis or occlusion. The bilateral ACAS are patent, without proximal stenosis or occlusion. There is no definite aneurysm. Posterior circulation: The bilateral V4 segments are patent. The basilar artery is patent. The major cerebellar arteries appear patent. The bilateral PCAs are patent, without proximal high-grade stenosis or occlusion. A left posterior communicating artery is identified. There is no definite aneurysm. Anatomic variants: None. Other: None. IMPRESSION: Patent vasculature of the head and neck without evidence of hemodynamically significant stenosis, occlusion, or dissection. Electronically Signed   By: Lesia Hausen M.D.   On: 09/06/2022 17:14   MR BRAIN WO CONTRAST  Result Date: 09/06/2022 CLINICAL DATA:  Neuro deficit with acute stroke suspected EXAM: MRI HEAD WITHOUT CONTRAST TECHNIQUE: Multiplanar, multiecho pulse sequences of the brain and surrounding structures were obtained without intravenous contrast. 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 Small acute infarct in the postcentral gyrus on the right chronic small vessel ischemia to a moderate degree. No acute hemorrhage, hydrocephalus, or masslike finding Vascular: Normal flow voids. Skull and upper cervical spine: Normal marrow signal. Sinuses/Orbits: Negative. IMPRESSION: 1. 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 2. 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 Electronically Signed   By: Tiburcio Pea M.D.   On: 09/06/2022 11:40   Medications:  meropenem (MERREM) IV 1 g (09/08/22 0425)    amiodarone  200 mg Oral  Daily   apixaban  2.5 mg Oral BID   atorvastatin  40 mg Oral Daily   ezetimibe  10 mg Oral Daily   hydrocortisone cream   Topical TID   insulin aspart  0-9 Units Subcutaneous TID WC   insulin aspart protamine- aspart  10 Units Subcutaneous BID WC   metoprolol succinate  50 mg Oral Daily   witch hazel-glycerin   Topical TID    Assessment/PlanVirginia E [name] is an 82 y.o. female atrial fibrillation, DM type 2, HFrEF, HTN, HL,  currently admitted for ESBL E coli UTI and CVA for whom nephrology is consulted for evaluation and management of AKI on CKD.    **AKI on CKD:  baseline Cr was in mid 2s > 08/2022 admission 2.6 > 3.6 > 3 now presenting with Cr 4.55 > 4.6 > 4.7 today.  Says no h/o outpt nephrology.  UA consistent with UTI.  Imaging with no new findings - do not suspect mild hydro on L significantly contributing.  Modest hypotension noted - suspect ATN related to acute infection + hypovolemia.  Based on exam still appears euvolemic and eating well so hold IVF for now. No indications for dialysis currently and hopefully will see some improvement with supportive care.  Will need more collateral info re: functional status, etc to determine feasibility  of dialysis should it become indicated.   Avoid hypotension and nephrotoxins.    **acute CVA: post central gyrus, neurology following.  PT/OT have been consulted.    **Hyponatremia:  mostly corrects related to hyperglycemia.  Suspect hypovolemia + impaired free water handling with AKI.   **Anemia, normocytic:  Baseline in the past few months 8-9s.  Currently 7-8s.  Denies overt bleeding.   Iron sat 11% - give IV iron while admitted, other nutritional deficiencies being screened.  Consider ESA if not trending up.    **HFrEF:  EF 35% 08/2022.  Appears euvolemic; PRN diuretics - none needed now.  Meds limited by GFR.     **UTI: UA consistent, h/o ESBL E coli.  On merrem.  Klebsiella, sens pending.   **R renal hypoechoic structure:  rads rec 73mo f/u  US ensure stable   **DM: insulin per primary.    **A fib:  on eliquis, BB, amiodarone.    Will follow, call with concerns.   Estill Bakes MD 09/08/2022, 10:51 AM  [facility] Pager: [phone]

## 2022-09-08 NOTE — Telephone Encounter (Signed)
Received notification Eliquis approved through 03/02/2023 Tried to call patient, currently admitted

## 2022-09-08 NOTE — TOC Initial Note (Signed)
Transition of Care [facility]) - Initial/Assessment Note    Patient Details  Name: [name] [name] MRN: [id number] Date of Birth: 1940-05-11  Transition of Care [employer]) CM/SW Contact:    Baldemar Lenis, LCSW Phone Number: 09/08/2022, 11:13 AM  Clinical Narrative:       CSW spoke with patient's spouse, Jonny Ruiz, to discuss recommendation for SNF placement. [name] asked multiple times if CSW discussed with patient and CSW repeated multiple times to him that the patient still seems confused and unable to make that decision. [name] said patient has been to SNF before and did not like it, he would take her home. CSW asked about 24/7 assist of 2 people, provided information on how patient is not ambulating at this time, and Jonny Ruiz said he would be coming up to the hospital today to see her himself. Meeting scheduled for 1:00 today. CSW to follow.            Expected Discharge Plan: Skilled Nursing Facility Barriers to Discharge: Continued Medical Work up, English as a second language teacher   Patient Goals and CMS Choice Patient states their goals for this hospitalization and ongoing recovery are:: patient unable to participate in goal setting, not fully oriented CMS Medicare.gov Compare Post Acute Care list provided to:: Patient Represenative (must comment) Choice offered to / list presented to : Spouse [place] ownership interest in [facility].provided to:: Spouse    Expected Discharge Plan and Services     Post Acute Care Choice: NA Living arrangements for the past 2 months: Single Family Home                                      Prior Living Arrangements/Services Living arrangements for the past 2 months: Single Family Home Lives with:: Spouse Patient language and need for interpreter reviewed:: No Do you feel safe going back to the place where you live?: Yes      Need for Family Participation in Patient Care: Yes (Comment) Care giver support system in place?: No (comment)    Criminal Activity/Legal Involvement Pertinent to Current Situation/Hospitalization: No - Comment as needed  Activities of Daily Living Home Assistive Devices/Equipment: Bedside commode/3-in-1 ADL Screening (condition at time of admission) Patient's cognitive ability adequate to safely complete daily activities?: Yes Is the patient deaf or have difficulty hearing?: No Does the patient have difficulty seeing, even when wearing glasses/contacts?: No Does the patient have difficulty concentrating, remembering, or making decisions?: No Patient able to express need for assistance with ADLs?: Yes Does the patient have difficulty dressing or bathing?: Yes Independently performs ADLs?: No Communication: Independent Dressing (OT): Needs assistance Is this a change from baseline?: Pre-admission baseline Grooming: Needs assistance Is this a change from baseline?: Pre-admission baseline Feeding: Independent Bathing: Needs assistance Is this a change from baseline?: Pre-admission baseline Toileting: Needs assistance Is this a change from baseline?: Pre-admission baseline In/Out Bed: Needs assistance Is this a change from baseline?: Pre-admission baseline Walks in Home: Dependent Is this a change from baseline?: Pre-admission baseline Does the patient have difficulty walking or climbing stairs?: Yes Weakness of Legs: Both Weakness of Arms/Hands: Both  Permission Sought/Granted Permission sought to share information with : Family Supports Permission granted to share information with : Yes, Verbal Permission Granted  Share Information with NAME: Jonny Ruiz     Permission granted to share info w Relationship: Spouse     Emotional Assessment   Attitude/Demeanor/Rapport: Unable to  Assess Affect (typically observed): Unable to Assess Orientation: : Oriented to Self, Oriented to Place, Oriented to  Time Alcohol / Substance Use: Not Applicable Psych Involvement: No (comment)  Admission diagnosis:   Poorly controlled diabetes mellitus (HCC) [E11.65] Acute cystitis with hematuria [N30.01] Acute CVA (cerebrovascular accident) (HCC) [I63.9] Cerebrovascular accident (CVA), unspecified mechanism (HCC) [I63.9] Patient Active Problem List   Diagnosis Date Noted   Bacteremia 09/07/2022   Acute CVA (cerebrovascular accident) (HCC) 09/06/2022   A-fib (HCC) 08/04/2022   Atrial fibrillation with rapid ventricular response (HCC) 08/03/2022   AKI (acute kidney injury) (HCC) 07/20/2022   Supratherapeutic INR 07/08/2022   Hemorrhagic cystitis 07/08/2022   Gross hematuria 07/07/2022   CKD (chronic kidney disease) stage 4, GFR 15-29 ml/min (HCC) 04/10/2020   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   Osteoporosis 07/22/2018   Closed compression fracture of L1 vertebra (HCC) 07/19/2018   Cardiomyopathy (HCC) 04/07/2018   Foot ulcer (HCC) 07/24/2016   Gram-negative infection    CKD (chronic kidney disease) stage 3, GFR 30-59 ml/min (HCC)    Postoperative anemia due to acute blood loss 01/04/2016   Left hip postoperative wound infection 01/02/2016   CAD S/P percutaneous coronary angioplasty 05/20/2015   Acute systolic CHF (congestive heart failure) (HCC) 05/20/2015   PAF (paroxysmal atrial fibrillation) (HCC) 05/20/2015   Acute kidney injury superimposed on chronic kidney disease (HCC) 05/11/2015   PCP NOTES >>>>>>>>>>>>>>>> 12/28/2014   Memory loss 05/17/2014   Diabetes (HCC) 01/23/2014   Anxiety and depression, PCP notes  11/24/2013   Severe obesity (BMI >= 40) (HCC) 07/14/2013   Encounter for therapeutic drug monitoring 06/02/2013   History of encephalitis 11/18/2012   Intertrochanteric fracture of right hip (HCC) 07/13/2012   GERD (gastroesophageal reflux disease) 10/05/2011   Seizure disorder (HCC) 05/24/2011   UTI (urinary tract infection) 05/22/2011   Annual physical exam >>>>>>>>>>>>>>>>>>>>>>>>>>>>>>>.. 02/10/2011   Atrial fibrillation (HCC)    LUNG NODULE 09/01/2006    Hyperlipidemia 03/27/2006   Hereditary and idiopathic peripheral neuropathy 03/27/2006   RETINOPATHY, BACKGROUND NOS 03/27/2006   Essential hypertension 03/27/2006   PCP:  [name], MD Pharmacy:   [facility] [address] [place], [place] - [address] [place] [address] Phone: [phone] Fax: [phone]  [facility] [address] [place], [place] - [address] [place] [address] Phone: [phone] Fax: [phone]     Social Determinants of Health (SDOH) Social History: SDOH Screenings   Food Insecurity: No Food Insecurity (09/06/2022)  Housing: Low Risk  (09/06/2022)  Transportation Needs: No Transportation Needs (09/06/2022)  Utilities: Not At Risk (09/06/2022)  Alcohol Screen: Low Risk  (08/22/2020)  Depression (PHQ2-9): Low Risk  (08/21/2022)  Financial Resource Strain: Low Risk  (08/22/2020)  Physical Activity: Inactive (08/22/2020)  Social Connections: Moderately Integrated (08/22/2020)  Stress: No Stress Concern Present (08/22/2020)  Tobacco Use: Medium Risk (09/07/2022)   SDOH Interventions: Food Insecurity Interventions: Intervention Not Indicated Housing Interventions: Intervention Not Indicated Transportation Interventions: Intervention Not Indicated Utilities Interventions: Intervention Not Indicated   Readmission Risk Interventions    08/06/2022    3:31 PM 07/23/2022   12:43 PM  Readmission Risk Prevention Plan  Transportation Screening Complete Complete  PCP or Specialist Appt within 5-7 Days  Complete  PCP or Specialist Appt within 3-5 Days Complete   Home Care Screening  Complete  Medication Review (RN CM)  Referral to Pharmacy  HRI or Home Care Consult Complete   Palliative Care Screening  Not Applicable   Medication Review (RN Care Manager) Complete

## 2022-09-08 NOTE — Progress Notes (Signed)
[facility]                                                                              [place] Brady, is a 82 y.o. female, DOB - 01-02-1941, ZOX:[id number] Admit date - 09/06/2022    Outpatient Primary MD for the patient is [name], MD  LOS - 2  days  Chief Complaint  Patient presents with   Weakness   Dizziness       Brief summary   Patient is a 82 year old female with A-fib, on Eliquis, CKD stage IV, IDDM, chronic systolic CHF, HTN, HLP, HSV encephalitis in 2014, recurrent UTIs presented with weakness, dizziness.  Patient presented from home, lives with her husband, reported that over the last couple of weeks she has been feeling very weak, shaky and hard time walking even short distances.  No fevers, nausea or vomiting.  At baseline ambulates with a walker however felt very weak to ambulate.  In ED, noted to be hyperglycemic CBG 496, potassium 5.4, sodium 125, creatinine 4.5, WBC 26, hemoglobin 8.  Baseline creatinine around 2.9 UA positive for UTI MRI brain showed small acute infarct in the postcentral gyrus on the right, large area of left temporal encephalomalacia correlating with history of remote herpes encephalitis. Patient was admitted for further workup.  Neurology consulted.   Assessment & Plan    Principal Problem:   Acute CVA (cerebrovascular accident) (HCC) -Presented with generalized weakness, dizziness - MRI brain showed small acute infarct in the postcentral gyrus on the right, large area of left temporal encephalomalacia correlating with history of remote herpes encephalitis. -MRA head and neck with no evidence of hemodynamically significant stenosis, occlusion or dissection. -Recent 2D echo 08/25/2022 showed EF of 35 to 40%, global hypokinesis -Lipid panel showed cholesterol 35, HDL less than 10, LDL not calculated, triglycerides 65, started on Lipitor 40 mg daily -Neurology following, continue eliquis -Hemoglobin A1c 9.7 -PT eval->  CIR  Active Problems: Klebsiella UTI  (urinary tract infection), bacteremia -Prior history of ESBL UTI, -Urine culture showed >100,000 colonies of Klebsiella, blood cultures  + Klebsiella -Continue IV meropenem, follow sensitivities -Repeat blood cultures 7/8 negative so far. -Lactic acid normal, procalcitonin 1.98   Normocytic anemia, acute on chronic -Baseline hemoglobin ~8 -Iron studies on 6/9 had shown  Fe 33, ferritin 177, percent saturation ratio 16, TIBC 213 -B12 1280, folate 11.6 -Noted hyperkalemia, hyponatremia, CKD trending up, likely anemia worsening due to CKD -Hemoglobin 7.1 on 7/8, transfused 1 unit packed RBCs, hemoglobin 8.1 today  Leukocytosis  -Possibly due to UTI, bacteremia, WBCs 26.7 on admission, has chronic leukocytosis and 08/2022 -WBC is trending down, follow sensitivities, adjust antibiotics -Continue IV meropenem  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) stage 4, GFR 15-29 ml/min (HCC) -Baseline creatinine 3.1-3.6.  Creatinine was 3.1 on 08/24/2022 -Presented with hyponatremia, hyperkalemia, creatinine 4.55 on admission -Creatinine is to trend up 4.7 today -Renal US shows mild left hydronephrosis, 1.5 cm hypoechoic structure in the lower pole of right kidney, cyst versus hemorrhagic debris in neoplasm, recommended follow-up ultrasound in 6 months. -Nephrology consulted, recommended continue current management, no indications for dialysis.  External hemorrhoid -  examined, not thrombosed, no active bleeding -Added Preparation H ointment, Tucks pads, feeling better today  Diabetes mellitus type 2, IDDM, complicated with CKD stage IV, uncontrolled with hyperglycemia -Hemoglobin A1c 9.7, presented with CBG of 496 -On NPH insulin 70/30, increased to 13 units twice daily, continue SSI CBG (last 3)  Recent Labs    09/07/22 1617 09/08/22 0638 09/08/22 1224  GLUCAP 185* 164* 173*    Chronic systolic CHF - Recent 2D echo 9/56/2130 showed EF of 35 to 40%, global  hypokinesis -Follows cardiology outpatient -No ACE/ARB secondary to CKD stage IV, AKI.  -Received gentle IV fluid hydration, creatinine still worsening.  Avoid fluid overload    Hyperlipidemia -Continue atorvastatin, Zetia    Essential hypertension -BP stable, continue Toprol-XL    Atrial fibrillation (HCC) -Rate controlled, continue amiodarone, Toprol-XL -Continue eliquis -Heart rate in low 50s, decreased Toprol-XL to 25 mg daily   Estimated body mass index is 24.74 kg/m as calculated from the following:   Height as of this encounter: 5\' 9"  (1.753 m).   Weight as of this encounter: 76 kg.  Code Status: Full code DVT Prophylaxis:  apixaban (ELIQUIS) tablet 2.5 mg Start: 09/06/22 1500 Place and maintain sequential compression device Start: 09/06/22 1256 apixaban (ELIQUIS) tablet 2.5 mg   Level of Care: Level of care: Progressive Family Communication: Updated patient's husband on the phone Disposition Plan:      Remains inpatient appropriate: Workup in progress   Procedures:  None  Consultants:   Neurology Nephrology  Antimicrobials:   Anti-infectives (From admission, onward)    Start     Dose/Rate Route Frequency Ordered Stop   09/06/22 1400  meropenem (MERREM) 1 g in sodium chloride 0.9 % 100 mL IVPB        1 g 200 mL/hr over 30 Minutes Intravenous Every 24 hours 09/06/22 1254            Medications  amiodarone  200 mg Oral Daily   apixaban  2.5 mg Oral BID   atorvastatin  40 mg Oral Daily   ezetimibe  10 mg Oral Daily   hydrocortisone cream   Topical TID   insulin aspart  0-9 Units Subcutaneous TID WC   insulin aspart protamine- aspart  10 Units Subcutaneous BID WC   metoprolol succinate  50 mg Oral Daily   witch hazel-glycerin   Topical TID      Subjective:   [name] was seen and examined today.  Sitting up in the chair, feeling a lot better today.  No fevers or chills.  No dizziness, chest pain or shortness of breath.   Objective:    Vitals:   09/08/22 0025 09/08/22 0427 09/08/22 0812 09/08/22 1227  BP: (!) 130/54 (!) 137/49 (!) 153/61 124/73  Pulse: (!) 52 (!) 51 (!) 51 (!) 53  Resp: 18 18 16 17   Temp: 97.9 F (36.6 C) (!) 97.5 F (36.4 C) (!) 97.4 F (36.3 C) (!) 97.4 F (36.3 C)  TempSrc: Oral Oral Oral Oral  SpO2: 93% 94% 93% 96%  Weight:      Height:        Intake/Output Summary (Last 24 hours) at 09/08/2022 1426 Last data filed at 09/08/2022 0400 Gross per 24 hour  Intake 743.56 ml  Output 400 ml  Net 343.56 ml     Wt Readings from Last 3 Encounters:  09/06/22 76 kg  08/25/22 75.7 kg  08/12/22 79.8 kg    Physical Exam General: Alert and oriented x 3, NAD  Cardiovascular: S1 S2 clear, RRR.  Bradycardia Respiratory: CTAB, no wheezing Gastrointestinal: Soft, nontender, nondistended, NBS Ext: no pedal edema bilaterally Neuro: no new deficits Psych: much more alert and oriented today    Data Reviewed:  I have personally reviewed following labs    CBC Lab Results  Component Value Date   WBC 20.5 (H) 09/08/2022   RBC 2.94 (L) 09/08/2022   HGB 8.1 (L) 09/08/2022   HCT 24.6 (L) 09/08/2022   MCV 83.7 09/08/2022   MCH 27.6 09/08/2022   PLT 402 (H) 09/08/2022   MCHC 32.9 09/08/2022   RDW 16.5 (H) 09/08/2022   LYMPHSABS 0.5 (L) 09/06/2022   MONOABS 0.5 09/06/2022   EOSABS 0.3 09/06/2022   BASOSABS 0.0 09/06/2022     Last metabolic panel Lab Results  Component Value Date   NA 126 (L) 09/08/2022   K 4.7 09/08/2022   CL 96 (L) 09/08/2022   CO2 22 09/08/2022   BUN 75 (H) 09/08/2022   CREATININE 4.70 (H) 09/08/2022   GLUCOSE 169 (H) 09/08/2022   GFRNONAA 9 (L) 09/08/2022   GFRAA 26 (L) 07/20/2018   CALCIUM 7.9 (L) 09/08/2022   PHOS 2.5 08/04/2022   PROT 6.7 09/06/2022   ALBUMIN 1.8 (L) 09/06/2022   LABGLOB 3.0 06/17/2021   AGRATIO 1.2 06/17/2021   BILITOT 0.8 09/06/2022   ALKPHOS 128 (H) 09/06/2022   AST 20 09/06/2022   ALT 17 09/06/2022   ANIONGAP 8 09/08/2022    CBG  (last 3)  Recent Labs    09/07/22 1617 09/08/22 0638 09/08/22 1224  GLUCAP 185* 164* 173*      Coagulation Profile: Recent Labs  Lab 09/06/22 1001  INR 2.0*     Radiology Studies: I have personally reviewed the imaging studies  US RENAL  Result Date: 09/07/2022 CLINICAL DATA:  Chronic kidney disease stage 3 Diabetes Hypertension EXAM: RENAL / URINARY TRACT ULTRASOUND COMPLETE COMPARISON:  CT chest abdomen pelvis 07/20/2022 FINDINGS: Right Kidney: Renal measurements: 9.2 x 3.8 x 6.0 cm = volume: 109 mL. Echogenicity within normal limits. No mass or hydronephrosis visualized. 2.8 cm simple cyst in the upper pole does not require dedicated imaging follow-up. 1.5 cm hypoechoic structure in the lower pole of the right kidney does not appear anechoic. Left Kidney: Renal measurements: 10.8 x 6.0 x 5.3 cm = volume: 179 mL. Echogenicity within normal limits. No mass. Mild left hydronephrosis, similar to prior exam. 3.4 cm simple cyst in the lower pole does not require dedicated imaging follow-up. Bladder: Evaluation limited due to under distension. Other: None. IMPRESSION: 1. Unchanged mild left hydronephrosis. 2. 1.5 cm hypoechoic structure in the lower pole of the right kidney can not be characterized as a simple cyst given its internal echoes. It is more likely to be a mildly complicated cyst with proteinaceous or hemorrhagic debris thin a neoplasm. Recommend follow-up ultrasound in 6 months to again evaluate this finding. Electronically Signed   By: Acquanetta Belling M.D.   On: 09/07/2022 11:52   MR ANGIO NECK W WO CONTRAST  Result Date: 09/06/2022 CLINICAL DATA:  Infarct seen on noncontrast MRI EXAM: MRA NECK WITHOUT AND WITH CONTRAST MRA HEAD WITHOUT CONTRAST TECHNIQUE: Multiplanar and multiecho pulse sequences of the neck were obtained without and with intravenous contrast. Angiographic images of the neck were obtained using MRA technique without and with intravenous contrast; Angiographic images of  the Circle of Willis were obtained using MRA technique without intravenous contrast. CONTRAST:  7mL GADAVIST GADOBUTROL 1 MMOL/ML IV SOLN COMPARISON:  None Available. FINDINGS: MRA NECK FINDINGS Aortic arch: The imaged aortic arch is normal. The origins of the major branch vessels are patent. The subclavian arteries are patent to the level imaged. Right carotid system: The right common, internal, and external carotid arteries are patent, with mild irregularity at the bulb likely reflecting atherosclerotic disease but no evidence of hemodynamically significant stenosis or occlusion. There is no evidence of dissection or aneurysm. Left carotid system: The left common, internal, and external carotid arteries are patent, with mild irregularity at the bulb likely reflecting atherosclerotic disease but no evidence of hemodynamically significant stenosis or occlusion there is no evidence of dissection or aneurysm Vertebral arteries: The vertebral artery origins are not well assessed. Otherwise, the vertebral arteries are patent with antegrade flow. There is no evidence of hemodynamically significant stenosis or occlusion there is no evidence of dissection or aneurysm. Other: None. MRA HEAD FINDINGS Image quality is degraded by motion artifact at the level of the cavernous ICAs. Anterior circulation: The intracranial ICAs are patent, without significant stenosis or occlusion. The MCAs are patent, without proximal high-grade stenosis or occlusion. The bilateral ACAS are patent, without proximal stenosis or occlusion. There is no definite aneurysm. Posterior circulation: The bilateral V4 segments are patent. The basilar artery is patent. The major cerebellar arteries appear patent. The bilateral PCAs are patent, without proximal high-grade stenosis or occlusion. A left posterior communicating artery is identified. There is no definite aneurysm. Anatomic variants: None. Other: None. IMPRESSION: Patent vasculature of the head  and neck without evidence of hemodynamically significant stenosis, occlusion, or dissection. Electronically Signed   By: Lesia Hausen M.D.   On: 09/06/2022 17:14   MR ANGIO HEAD WO CONTRAST  Result Date: 09/06/2022 CLINICAL DATA:  Infarct seen on noncontrast MRI EXAM: MRA NECK WITHOUT AND WITH CONTRAST MRA HEAD WITHOUT CONTRAST TECHNIQUE: Multiplanar and multiecho pulse sequences of the neck were obtained without and with intravenous contrast. Angiographic images of the neck were obtained using MRA technique without and with intravenous contrast; Angiographic images of the Circle of Willis were obtained using MRA technique without intravenous contrast. CONTRAST:  7mL GADAVIST GADOBUTROL 1 MMOL/ML IV SOLN COMPARISON:  None Available. FINDINGS: MRA NECK FINDINGS Aortic arch: The imaged aortic arch is normal. The origins of the major branch vessels are patent. The subclavian arteries are patent to the level imaged. Right carotid system: The right common, internal, and external carotid arteries are patent, with mild irregularity at the bulb likely reflecting atherosclerotic disease but no evidence of hemodynamically significant stenosis or occlusion. There is no evidence of dissection or aneurysm. Left carotid system: The left common, internal, and external carotid arteries are patent, with mild irregularity at the bulb likely reflecting atherosclerotic disease but no evidence of hemodynamically significant stenosis or occlusion there is no evidence of dissection or aneurysm Vertebral arteries: The vertebral artery origins are not well assessed. Otherwise, the vertebral arteries are patent with antegrade flow. There is no evidence of hemodynamically significant stenosis or occlusion there is no evidence of dissection or aneurysm. Other: None. MRA HEAD FINDINGS Image quality is degraded by motion artifact at the level of the cavernous ICAs. Anterior circulation: The intracranial ICAs are patent, without significant  stenosis or occlusion. The MCAs are patent, without proximal high-grade stenosis or occlusion. The bilateral ACAS are patent, without proximal stenosis or occlusion. There is no definite aneurysm. Posterior circulation: The bilateral V4 segments are patent. The basilar artery is patent. The major cerebellar arteries appear patent. The bilateral PCAs are  patent, without proximal high-grade stenosis or occlusion. A left posterior communicating artery is identified. There is no definite aneurysm. Anatomic variants: None. Other: None. IMPRESSION: Patent vasculature of the head and neck without evidence of hemodynamically significant stenosis, occlusion, or dissection. Electronically Signed   By: Lesia Hausen M.D.   On: 09/06/2022 17:14       Aziah Brostrom M.D. [facility] 09/08/2022, 2:26 PM  Available via Epic secure chat 7am-7pm After 7 pm, please refer to night coverage provider listed on amion.

## 2022-09-08 NOTE — [place] FL2 (Signed)
[place] MEDICAID FL2 LEVEL OF CARE FORM     IDENTIFICATION  Patient Name: [name] [name] Birthdate: 08-26-1940 Sex: female Admission Date (Current Location): 09/06/2022  [facility] and [place] Number:  Producer, television/film/video and Address:  [facility], [address] [place], [place] [address]      Provider Number: [id number]  Attending Physician Name and Address:  Cathren Harsh, MD  Relative Name and Phone Number:       Current Level of Care: Hospital Recommended Level of Care: Skilled Nursing Facility Prior Approval Number:    Date Approved/Denied:   PASRR Number: [id number]  Discharge Plan: SNF    Current Diagnoses: Patient Active Problem List   Diagnosis Date Noted   Bacteremia 09/07/2022   Acute CVA (cerebrovascular accident) (HCC) 09/06/2022   A-fib (HCC) 08/04/2022   Atrial fibrillation with rapid ventricular response (HCC) 08/03/2022   AKI (acute kidney injury) (HCC) 07/20/2022   Supratherapeutic INR 07/08/2022   Hemorrhagic cystitis 07/08/2022   Gross hematuria 07/07/2022   CKD (chronic kidney disease) stage 4, GFR 15-29 ml/min (HCC) 04/10/2020   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   Osteoporosis 07/22/2018   Closed compression fracture of L1 vertebra (HCC) 07/19/2018   Cardiomyopathy (HCC) 04/07/2018   Foot ulcer (HCC) 07/24/2016   Gram-negative infection    CKD (chronic kidney disease) stage 3, GFR 30-59 ml/min (HCC)    Postoperative anemia due to acute blood loss 01/04/2016   Left hip postoperative wound infection 01/02/2016   CAD S/P percutaneous coronary angioplasty 05/20/2015   Acute systolic CHF (congestive heart failure) (HCC) 05/20/2015   PAF (paroxysmal atrial fibrillation) (HCC) 05/20/2015   Acute kidney injury superimposed on chronic kidney disease (HCC) 05/11/2015   PCP NOTES >>>>>>>>>>>>>>>> 12/28/2014   Memory loss 05/17/2014   Diabetes (HCC) 01/23/2014   Anxiety and depression, PCP notes  11/24/2013    Severe obesity (BMI >= 40) (HCC) 07/14/2013   Encounter for therapeutic drug monitoring 06/02/2013   History of encephalitis 11/18/2012   Intertrochanteric fracture of right hip (HCC) 07/13/2012   GERD (gastroesophageal reflux disease) 10/05/2011   Seizure disorder (HCC) 05/24/2011   UTI (urinary tract infection) 05/22/2011   Annual physical exam >>>>>>>>>>>>>>>>>>>>>>>>>>>>>>>.. 02/10/2011   Atrial fibrillation (HCC)    LUNG NODULE 09/01/2006   Hyperlipidemia 03/27/2006   Hereditary and idiopathic peripheral neuropathy 03/27/2006   RETINOPATHY, BACKGROUND NOS 03/27/2006   Essential hypertension 03/27/2006    Orientation RESPIRATION BLADDER Height & Weight     Self, Time, Place  Normal Incontinent Weight: 167 lb 8.8 oz (76 kg) Height:  5\' 9"  (175.3 cm)  BEHAVIORAL SYMPTOMS/MOOD NEUROLOGICAL BOWEL NUTRITION STATUS      Continent Diet (carb modified)  AMBULATORY STATUS COMMUNICATION OF NEEDS Skin   Extensive Assist Verbally Normal                       Personal Care Assistance Level of Assistance  Bathing, Feeding, Dressing Bathing Assistance: Maximum assistance Feeding assistance: Limited assistance Dressing Assistance: Maximum assistance     Functional Limitations Info  Sight Sight Info: Impaired        SPECIAL CARE FACTORS FREQUENCY  PT (By licensed PT), OT (By licensed OT)     PT Frequency: 5x/wk OT Frequency: 5x/wk            Contractures Contractures Info: Not present    Additional Factors Info  Code Status, Allergies, Insulin Sliding Scale Code Status Info: Full Allergies Info: Procaine Hcl,  Amoxicillin   Insulin Sliding Scale Info: see DC summary       Current Medications (09/08/2022):  This is the current hospital active medication list Current Facility-Administered Medications  Medication Dose Route Frequency Provider Last Rate Last Admin   amiodarone (PACERONE) tablet 200 mg  200 mg Oral Daily Kirby Crigler, Mir M, MD   200 mg at 09/08/22 0951    apixaban (ELIQUIS) tablet 2.5 mg  2.5 mg Oral BID Kirby Crigler, Mir M, MD   2.5 mg at 09/08/22 6045   atorvastatin (LIPITOR) tablet 40 mg  40 mg Oral Daily Marvel Plan, MD   40 mg at 09/08/22 0951   ezetimibe (ZETIA) tablet 10 mg  10 mg Oral Daily Kirby Crigler, Mir M, MD   10 mg at 09/08/22 4098   ferric gluconate (FERRLECIT) 250 mg in sodium chloride 0.9 % 250 mL IVPB  250 mg Intravenous Daily Tyler Pita, MD       hydrocortisone cream 1 %   Topical TID Rai, Delene Ruffini, MD   Given at 09/08/22 0952   insulin aspart (novoLOG) injection 0-9 Units  0-9 Units Subcutaneous TID WC Rai, Ripudeep K, MD   2 Units at 09/08/22 1256   insulin aspart protamine- aspart (NOVOLOG MIX 70/30) injection 10 Units  10 Units Subcutaneous BID WC Rai, Ripudeep K, MD   10 Units at 09/08/22 0639   meropenem (MERREM) 1 g in sodium chloride 0.9 % 100 mL IVPB  1 g Intravenous Q24H Len Childs T, RPH 200 mL/hr at 09/08/22 0425 1 g at 09/08/22 0425   metoprolol succinate (TOPROL-XL) 24 hr tablet 50 mg  50 mg Oral Daily Rai, Ripudeep K, MD   50 mg at 09/08/22 1256   Oral care mouth rinse  15 mL Mouth Rinse PRN Maryln Gottron, MD       witch hazel-glycerin (TUCKS) pad   Topical TID Cathren Harsh, MD   Given at 09/08/22 [phone]     Discharge Medications: Please see discharge summary for a list of discharge medications.  Relevant Imaging Results:  Relevant Lab Results:   Additional Information SS#" [id number]  [place], [place]

## 2022-09-09 ENCOUNTER — Ambulatory Visit (HOSPITAL_COMMUNITY): Payer: Medicare Other

## 2022-09-09 DIAGNOSIS — R7881 Bacteremia: Secondary | ICD-10-CM

## 2022-09-09 DIAGNOSIS — I5023 Acute on chronic systolic (congestive) heart failure: Secondary | ICD-10-CM | POA: Insufficient documentation

## 2022-09-09 DIAGNOSIS — N179 Acute kidney failure, unspecified: Secondary | ICD-10-CM | POA: Diagnosis not present

## 2022-09-09 DIAGNOSIS — N184 Chronic kidney disease, stage 4 (severe): Secondary | ICD-10-CM

## 2022-09-09 DIAGNOSIS — I5022 Chronic systolic (congestive) heart failure: Secondary | ICD-10-CM | POA: Insufficient documentation

## 2022-09-09 DIAGNOSIS — D631 Anemia in chronic kidney disease: Secondary | ICD-10-CM | POA: Insufficient documentation

## 2022-09-09 DIAGNOSIS — I639 Cerebral infarction, unspecified: Secondary | ICD-10-CM | POA: Diagnosis not present

## 2022-09-09 DIAGNOSIS — N189 Chronic kidney disease, unspecified: Secondary | ICD-10-CM

## 2022-09-09 DIAGNOSIS — E871 Hypo-osmolality and hyponatremia: Secondary | ICD-10-CM | POA: Diagnosis not present

## 2022-09-09 LAB — PROTEIN ELECTROPHORESIS, SERUM
A/G Ratio: 0.4 — ABNORMAL LOW (ref 0.7–1.7)
Albumin ELP: 1.6 g/dL — ABNORMAL LOW (ref 2.9–4.4)
Alpha-1-Globulin: 0.4 g/dL (ref 0.0–0.4)
Alpha-2-Globulin: 0.9 g/dL (ref 0.4–1.0)
Beta Globulin: 0.7 g/dL (ref 0.7–1.3)
Gamma Globulin: 1.8 g/dL (ref 0.4–1.8)
Globulin, Total: 3.7 g/dL (ref 2.2–3.9)
Total Protein ELP: 5.3 g/dL — ABNORMAL LOW (ref 6.0–8.5)

## 2022-09-09 LAB — CBC
HCT: 27.6 % — ABNORMAL LOW (ref 36.0–46.0)
Hemoglobin: 9 g/dL — ABNORMAL LOW (ref 12.0–15.0)
MCH: 28.3 pg (ref 26.0–34.0)
MCHC: 32.6 g/dL (ref 30.0–36.0)
MCV: 86.8 fL (ref 80.0–100.0)
Platelets: 408 10*3/uL — ABNORMAL HIGH (ref 150–400)
RBC: 3.18 MIL/uL — ABNORMAL LOW (ref 3.87–5.11)
RDW: 16.9 % — ABNORMAL HIGH (ref 11.5–15.5)
WBC: 18 10*3/uL — ABNORMAL HIGH (ref 4.0–10.5)
nRBC: 0 % (ref 0.0–0.2)

## 2022-09-09 LAB — BASIC METABOLIC PANEL
Anion gap: 11 (ref 5–15)
BUN: 80 mg/dL — ABNORMAL HIGH (ref 8–23)
CO2: 20 mmol/L — ABNORMAL LOW (ref 22–32)
Calcium: 7.8 mg/dL — ABNORMAL LOW (ref 8.9–10.3)
Chloride: 93 mmol/L — ABNORMAL LOW (ref 98–111)
Creatinine, Ser: 4.84 mg/dL — ABNORMAL HIGH (ref 0.44–1.00)
GFR, Estimated: 9 mL/min — ABNORMAL LOW (ref >60)
Glucose, Bld: 292 mg/dL — ABNORMAL HIGH (ref 70–99)
Potassium: 5 mmol/L (ref 3.5–5.1)
Sodium: 124 mmol/L — ABNORMAL LOW (ref 135–145)

## 2022-09-09 LAB — CULTURE, BLOOD (ROUTINE X 2)

## 2022-09-09 LAB — GLUCOSE, CAPILLARY
Glucose-Capillary: 297 mg/dL — ABNORMAL HIGH (ref 70–99)
Glucose-Capillary: 266 mg/dL — ABNORMAL HIGH (ref 70–99)
Glucose-Capillary: 201 mg/dL — ABNORMAL HIGH (ref 70–99)
Glucose-Capillary: 215 mg/dL — ABNORMAL HIGH (ref 70–99)

## 2022-09-09 LAB — VITAMIN B1: Vitamin B1 (Thiamine): 157.4 nmol/L (ref 66.5–200.0)

## 2022-09-09 LAB — URINE CULTURE

## 2022-09-09 MED ORDER — ACETAMINOPHEN 325 MG PO TABS
650.0000 mg | ORAL_TABLET | Freq: Four times a day (QID) | ORAL | Status: DC | PRN
  Administered 2022-09-09 – 2022-09-17 (×9): 650 mg via ORAL
  Filled 2022-09-09 (×8): qty 2

## 2022-09-09 MED ORDER — INSULIN ASPART 100 UNIT/ML IJ SOLN
0.0000 [IU] | Freq: Three times a day (TID) | INTRAMUSCULAR | Status: DC
  Administered 2022-09-09: 8 [IU] via SUBCUTANEOUS
  Administered 2022-09-09: 5 [IU] via SUBCUTANEOUS
  Administered 2022-09-10 (×3): 3 [IU] via SUBCUTANEOUS
  Administered 2022-09-11: 2 [IU] via SUBCUTANEOUS
  Administered 2022-09-11: 3 [IU] via SUBCUTANEOUS
  Administered 2022-09-12 – 2022-09-13 (×3): 2 [IU] via SUBCUTANEOUS
  Administered 2022-09-14 (×2): 3 [IU] via SUBCUTANEOUS
  Administered 2022-09-15 (×2): 2 [IU] via SUBCUTANEOUS
  Administered 2022-09-15: 3 [IU] via SUBCUTANEOUS
  Administered 2022-09-16: 2 [IU] via SUBCUTANEOUS
  Administered 2022-09-17: 3 [IU] via SUBCUTANEOUS

## 2022-09-09 MED ORDER — SODIUM CHLORIDE 0.9 % IV BOLUS
250.0000 mL | Freq: Once | INTRAVENOUS | Status: AC
  Administered 2022-09-09: 250 mL via INTRAVENOUS

## 2022-09-09 MED ORDER — SODIUM CHLORIDE 0.9 % IV SOLN: INTRAVENOUS | Status: AC

## 2022-09-09 MED ORDER — INSULIN ASPART PROT & ASPART (70-30 MIX) 100 UNIT/ML ~~LOC~~ SUSP
14.0000 [IU] | Freq: Two times a day (BID) | SUBCUTANEOUS | Status: DC
  Administered 2022-09-09 – 2022-09-12 (×6): 14 [IU] via SUBCUTANEOUS
  Filled 2022-09-09: qty 10

## 2022-09-09 MED ORDER — INSULIN ASPART 100 UNIT/ML IJ SOLN
0.0000 [IU] | Freq: Every day | INTRAMUSCULAR | Status: DC
  Administered 2022-09-09: 2 [IU] via SUBCUTANEOUS

## 2022-09-09 NOTE — Assessment & Plan Note (Addendum)
Glucoses controlled - Continue NPH - Continue sliding scale corrections

## 2022-09-09 NOTE — Assessment & Plan Note (Addendum)
No significant fluid overload today either - Hold furosemide - Continue metoprolol

## 2022-09-09 NOTE — Inpatient Diabetes Management (Signed)
Inpatient Diabetes Program Recommendations  AACE/ADA: New Consensus Statement on Inpatient Glycemic Control (2015)  Target Ranges:  Prepandial:   less than 140 mg/dL      Peak postprandial:   less than 180 mg/dL (1-2 hours)      Critically ill patients:  140 - 180 mg/dL   Lab Results  Component Value Date   GLUCAP 266 (H) 09/09/2022   HGBA1C 9.7 (H) 09/06/2022    Review of Glycemic Control  Diabetes history: type 2? Outpatient Diabetes medications: 70/30 insulin 25 units in am, 5 units in pm Current orders for Inpatient glycemic control: 70/30 insulin 14 units BID, Novolog 0-15 units correction scale TID, Novolog 0-5 units HS scale  Inpatient Diabetes Program Recommendations:   Spoke with patient at the bedside. States that she was diagnosed with diabetes about 25-30 years ago. Has been on insulin for a long time. Currently takes 70/30 insulin 25 units in am, 5 units in pm. Discussed her HgbA1C of 9.7% and that it shows that her average blood sugars have been around 230 mg/dl over 2-3 month period. She was not sure what her past A1C has been.  Lives with her husband, daughter, granddaughter. Her husband and granddaughter do the cooking. She does check blood sugars every am.   Will continue to monitor blood sugars while in the hospital.  Smith Mince RN BSN CDE Diabetes Coordinator Pager: [phone]  8am-5pm

## 2022-09-09 NOTE — Assessment & Plan Note (Signed)
MRI brain showed right post-central gyrus infarct - Non-invasive angiography showed no significant disease - Echocardiogram completed 1 month ago showed no cardiogenic source of embolism - Lipids ordered: LDL undetectable - Continue Lipitor, Zetia - Aspirin deferred in place of Eliquis - Afib known, stable on Eliquis and amiodarone - tPA not given because outside window - Dysphagia screen completed - PT eval ordered: recommend SNF

## 2022-09-09 NOTE — Assessment & Plan Note (Addendum)
Repeat blood cultures from 7/8 growing ESBL in 1 of 3, however this was drawn within minutes of first dose of meropenem, so I do not think change of plan is necessary - Continue Meropenem, day 6 of 7

## 2022-09-09 NOTE — Hospital Course (Addendum)
[name] [name] is an 82 y.o. F with CKD IV baseline ~1.7-2.2, DM, sCHF EF 35-40%, hx HSV encephalitis 2014, pAF on Eliquis, seizures, hx ESBL UTI who presented with dizziness, found to have stroke, acute renal failure and ESBL bacteremia.    Significant events: 7/7: Admitted for stroke, on antibiotics/gentle IVF; Neuro consulted 7/8: Nephrology consulted; BCx growing Klebsiella 7/10: Cr worse, IVF restarted 7/14: Hgb down to 6, transfused 2 units 7/15: Vascath placed, underwent first HD, required 3rd unit PRBCs 7/16: Hgb again <7, required 4th unit PRBC 7/17: HD #2 7/19: Tunneled line placed, HD#3    Significant studies: 7/7: CXR -- perihilar opacities, c/w infection or edema 7/7: MRI brain/MRA H&N -- small R postcentral gyrus infarct, no significant vessel disease  7/8: US renal -- unchanged mild L hydro, 1.5 cm cyst   Significant microbiology data: 7/7 BCx x2: ESBL Klebsiella in 2/2 7/7 UCx: ESBL Klebsiella 7/8 BCx x2: ESBL in 1/3 (although this was drawn in very close proximity to meropenem) 7/13 BCx x2: MRSE in 1/3 (likely contaminant)    Procedures: 7/15: Vascath placed, first HD 7/17: HD#2 7/19: Tunnled line placed, HD#3   Consults: Neurology Nephrology

## 2022-09-09 NOTE — Care Management Important Message (Signed)
Important Message  Patient Details  Name: [name] [name] MRN: [id number] Date of Birth: 12/31/1940   Medicare Important Message Given:  Yes     Sherilyn Banker 09/09/2022, 11:31 AM

## 2022-09-09 NOTE — Assessment & Plan Note (Signed)
Continue Lipitor and Zetia 

## 2022-09-09 NOTE — Assessment & Plan Note (Signed)
-   Continue cholesterol medicines, metoprolol, Eliquis

## 2022-09-09 NOTE — Plan of Care (Signed)
  Problem: Education: Goal: Ability to describe self-care measures that may prevent or decrease complications (Diabetes Survival Skills Education) will improve Outcome: Progressing   Problem: Coping: Goal: Ability to adjust to condition or change in health will improve Outcome: Progressing   Problem: Fluid Volume: Goal: Ability to maintain a balanced intake and output will improve Outcome: Progressing   Problem: Nutritional: Goal: Maintenance of adequate nutrition will improve Outcome: Progressing   Problem: Education: Goal: Knowledge of disease or condition will improve Outcome: Progressing   Problem: Ischemic Stroke/TIA Tissue Perfusion: Goal: Complications of ischemic stroke/TIA will be minimized Outcome: Progressing   Problem: Coping: Goal: Will identify appropriate support needs Outcome: Progressing   Problem: Nutrition: Goal: Risk of aspiration will decrease Outcome: Progressing   Problem: Clinical Measurements: Goal: Respiratory complications will improve Outcome: Progressing   Problem: Health Behavior/Discharge Planning: Goal: Ability to manage health-related needs will improve Outcome: Not Progressing   Problem: Metabolic: Goal: Ability to maintain appropriate glucose levels will improve Outcome: Not Progressing   Problem: Nutritional: Goal: Progress toward achieving an optimal weight will improve Outcome: Not Progressing   Problem: Skin Integrity: Goal: Risk for impaired skin integrity will decrease Outcome: Not Progressing   Problem: Health Behavior/Discharge Planning: Goal: Ability to manage health-related needs will improve Outcome: Not Progressing

## 2022-09-09 NOTE — Progress Notes (Signed)
  Progress Note   Patient: [name] [name]:[id number] DOB: 06/17/1940 DOA: 09/06/2022     3 DOS: the patient was seen and examined on 09/09/2022 at 8:58 AM      Brief hospital course: Mrs. Huang is an 82 y.o. F with CKD IV baseline ~1.7-2.2, DM, sCHF EF 35-40%, hx HSV encephalitis 2014, pAF on Eliquis, seizures, hx ESBL UTI who presented with dizziness, found to have stroke, acute renal failure and ESBL bacteremia.     Assessment and Plan: * Acute renal failure superimposed on stage 4 chronic kidney disease (HCC) Cr slightly worse today.  No acidosis, uremia or fluid overload.   - Consult Nephrology - Gentle fluids per Neph - Strict I/Os - Hold furosemide    Acute CVA (cerebrovascular accident) (HCC) - Continue Eliquis - Continue Lipitor, Zetia  ESBL Bacteremia due to UTI - Continue Meropenem, day 4 of 7  Hyponatremia Worsening. - IVF per Nephrology - Trend Na closely  Chronic systolic CHF (congestive heart failure) (HCC) Appears euvolemic - Hold furosemide - Continue metoprolol  Anemia in chronic kidney disease (CKD) Hgb stable  CKD (chronic kidney disease) stage 4, GFR 15-29 ml/min (HCC)    CAD S/P percutaneous coronary angioplasty - Continue cholesterol medicines, metoprolol, Eliquis  Diabetes (HCC) Glucoses worse - Titrate NPH up - Increase SS scale   Seizure disorder (HCC) No longer on AED  Paroxysmal atrial fibrillation (HCC) Rate controlled - Continue amiodarone, Eliquis, metoprolol  Essential hypertension BP normal - Continue metoprolol  Hyperlipidemia - Continue Lipitor and Zetia          Subjective: Patient has no confusion, fever, respiratory symptoms.  She has no swelling, orthopnea.     Physical Exam: BP (!) 110/48 (BP Location: Right Arm)   Pulse (!) 50   Temp 97.7 F (36.5 C) (Oral)   Resp 18   Ht 5\' 9"  (1.753 m)   Wt 76 kg   SpO2 98%   BMI 24.74 kg/m   Elderly adult female, lying in bed, no acute  distress RRR, no murmurs, no peripheral edema Respiratory rate normal, lungs clear without rales or wheezes Abdomen soft no tenderness palpation or guarding, no ascites or distention Attention normal, affect normal, judgment insight appear normal  Data Reviewed: Patient metabolic panel shows sodium down to 124, creatinine up to 4.8, CBG in the high 200s CBC shows leukocytosis down to 18, hemoglobin stable at 9 LDL very low  Family Communication: None present    Disposition: Status is: Inpatient Patient was admitted for dizziness, found to have worsening renal failure, stroke, and ESBL bacteremia  Her infection is stabilized and she needs 3 more days of antibiotics until Saturday, will need to have this as an inpatient  In addition, her renal function is worsening, and nephrology is planning starting IV fluids        Author: Alberteen Sam, MD 09/09/2022 4:17 PM  For on call review [email or web address].

## 2022-09-09 NOTE — Progress Notes (Signed)
Physical Therapy Treatment Patient Details Name: ELLICE [name] MRN: [id number] DOB: 30-Jul-1940 Today's Date: 09/09/2022   History of Present Illness Pt is an 82 y/o female who presents s/p syncope and fall while walking to the car as family was attempting to bring her to the ED for vomiting, weakness. Of note, pt with recent admission with d/c 07/10/22 from [facility] for UTI and hematura with elevated INR (discharged with foley). PMH significant for A fib, L eye blindness, DM II, HTN, osteopenia, pelvic fracture 2018, seizures, systolic CHF, ankle fx surgery 2017, IM nail R in 2014 and L 2017, I&D hip 2017, B shoulder surgery.    PT Comments  Making notable progress towards acute rehab goals. Eager to stand due to pain from hemorrhoids. Min assist for bed mobility, sit<>stand transfer, and short distance gait (5') in room today using a rolling walker for support. Easily fatigued (SpO2 96%) moderate dyspnea, resolved with seated rest break. Patient will continue to benefit from skilled physical therapy services to further improve independence with functional mobility.     Assistance Recommended at Discharge Frequent or constant Supervision/Assistance  If plan is discharge home, recommend the following:  Can travel by private vehicle    A lot of help with bathing/dressing/bathroom;Assistance with cooking/housework;Direct supervision/assist for medications management;Direct supervision/assist for financial management;Help with stairs or ramp for entrance;Assist for transportation;A lot of help with walking and/or transfers   Yes  Equipment Recommendations  None recommended by PT    Recommendations for Other Services       Precautions / Restrictions Precautions Precautions: Fall Precaution Comments: L eye blindness Restrictions Weight Bearing Restrictions: No     Mobility  Bed Mobility Overal bed mobility: Needs Assistance Bed Mobility: Supine to Sit     Supine to sit: Min assist,  HOB elevated     General bed mobility comments: Min assist for RLE support out of bed, slow but nearly at min guard level.Cues for technique.    Transfers Overall transfer level: Needs assistance Equipment used: Rolling walker (2 wheels) Transfers: Sit to/from Stand, Bed to chair/wheelchair/BSC Sit to Stand: Min assist   Step pivot transfers: Min assist       General transfer comment: Min assist for boost to stand from bed x2 and practiced from the recliner with cues for hand placement, technique, and mechanics (trunk heavily flexed as she rises.) Steps towards recliner with min assist for walker placement, tactile cues for upright posture and sequencing to align with Recliner before sitting.    Ambulation/Gait Ambulation/Gait assistance: Min assist Gait Distance (Feet): 5 Feet Assistive device: Rolling walker (2 wheels) Gait Pattern/deviations: Step-through pattern, Decreased stride length, Shuffle, Trunk flexed Gait velocity: dec Gait velocity interpretation: <1.31 ft/sec, indicative of household ambulator Pre-gait activities: Standing march General Gait Details: Tolerated very short distance forward and backwards from chair today. Declines further distance. Easily fatigued with mod dyspnea (SpO2 96% on RA.) No buckling however LEs appear weak and shaky. Good UE support on RW to stabilize with cues and min assist to navigate RW placement.   Stairs             Wheelchair Mobility     Tilt Bed    Modified Rankin (Stroke Patients Only) Modified Rankin (Stroke Patients Only) Pre-Morbid Rankin Score: Moderately severe disability Modified Rankin: Severe disability     Balance Overall balance assessment: Needs assistance Sitting-balance support: Feet supported, No upper extremity supported Sitting balance-Leahy Scale: Fair Sitting balance - Comments: close guarding in sitting  Standing balance support: Bilateral upper extremity supported Standing balance-Leahy  Scale: Poor Standing balance comment: Stands with min guard                            Cognition Arousal/Alertness: Awake/alert Behavior During Therapy: WFL for tasks assessed/performed Overall Cognitive Status: No family/caregiver present to determine baseline cognitive functioning                                 General Comments: A little tangential, requires several cues for instruction intermittently.        Exercises      General Comments        Pertinent Vitals/Pain Pain Assessment Pain Assessment: Faces Faces Pain Scale: Hurts whole lot Pain Location: bottom (hemorrhoids) Pain Descriptors / Indicators: Grimacing, Guarding, Sore, Discomfort Pain Intervention(s): Limited activity within patient's tolerance, Monitored during session, Premedicated before session, Repositioned, Other (comment) (Pillow in chair)    Home Living                          Prior Function            PT Goals (current goals can now be found in the care plan section) Acute Rehab PT Goals Patient Stated Goal: return home PT Goal Formulation: With patient Time For Goal Achievement: 09/21/22 Potential to Achieve Goals: Fair Progress towards PT goals: Progressing toward goals    Frequency    Min 3X/week      PT Plan Current plan remains appropriate    Co-evaluation              AM-PAC PT "6 Clicks" Mobility   Outcome Measure  Help needed turning from your back to your side while in a flat bed without using bedrails?: A Little Help needed moving from lying on your back to sitting on the side of a flat bed without using bedrails?: A Little Help needed moving to and from a bed to a chair (including a wheelchair)?: A Little Help needed standing up from a chair using your arms (e.g., wheelchair or bedside chair)?: A Little Help needed to walk in hospital room?: A Lot Help needed climbing 3-5 steps with a railing? : Total 6 Click Score: 15     End of Session Equipment Utilized During Treatment: Gait belt Activity Tolerance: Patient limited by pain Patient left: with call bell/phone within reach;in chair;with chair alarm set;with SCD's reapplied Nurse Communication: Mobility status;Patient requests pain meds PT Visit Diagnosis: Unsteadiness on feet (R26.81);Muscle weakness (generalized) (M62.81);Difficulty in walking, not elsewhere classified (R26.2);Pain;Other abnormalities of gait and mobility (R26.89) Pain - part of body:  (buttocks)     Time: 1610-9604 PT Time Calculation (min) (ACUTE ONLY): 17 min  Charges:    $Therapeutic Activity: 8-22 mins PT General Charges $$ ACUTE PT VISIT: 1 Visit                     [name], PT, DPT [facility]  Rehabilitation Services Physical Therapist Office: [phone] Website: [email or web address]    Berton Mount 09/09/2022, 12:25 PM

## 2022-09-09 NOTE — Assessment & Plan Note (Signed)
Rate controlled - Continue amiodarone, Eliquis, metoprolol

## 2022-09-09 NOTE — Assessment & Plan Note (Addendum)
SPEP negative.  [place] without clinically significant disease.  Fluids restarted 7/10, documented urine output minimal yesterday.  Cr no change, bicarb down to 18, BUN up to 89, K5.3 - Defer fluids, dialysis, bicarb, diuretics to nephrology

## 2022-09-09 NOTE — Progress Notes (Signed)
Speech Language Pathology Treatment: Cognitive-Linquistic  Patient Details Name: [name] [name] MRN: [id number] DOB: 1941-02-01 Today's Date: 09/09/2022 Time: 4098-1191 SLP Time Calculation (min) (ACUTE ONLY): 28 min  Assessment / Plan / Recommendation Clinical Impression  Pt much more alert today and positioned upright in chair upon SLP arrival. She confirms that she handled all finances independently PTA, but required assistance from her husband with medication administration. Pt states that has had ongoing difficulties with her memory and her social language remains intact. She correctly answered all orientation questions and provided a detailed medical history. During conversation, pt appeared to have difficulty sustaining attention to complete her story. SLP attempted to complete a pillbox activity, although pt's vision significantly affected her ability to complete the activity. Given five words, pt was able to recall 3/5 independently. This increased to 5/5 when given Mod verbal cues. Pt named five animals in a one minute divergent naming activity and reported being able to picture the animals, but being unable to say their name. Pt may benefit from further SLP f/u to address memory, attention, and word-finding through functional treatment activities. Will f/u as able.    HPI HPI: Pt is an 82 yo female presenting from home to ED with weakness, dizziness, difficulty ambulating x 2 months, and suspected recurrent ESBL UTI. MRI Brain revealed small acute infarct in R postcentral gyrus and large area of L temporal encephalomalacia correlating with history of remote herpes encephalitis. PMH includes afib, CKD stage III, insulin-dependent DM2, CHF with reduced EF, HTN, HLD      SLP Plan  Continue with current plan of care      Recommendations for follow up therapy are one component of a multi-disciplinary discharge planning process, led by the attending physician.  Recommendations may be  updated based on patient status, additional functional criteria and insurance authorization.    Recommendations                         Frequent or constant Supervision/Assistance Cognitive communication deficit (Y78.295)     Continue with current plan of care     Gwynneth Aliment, M.A., CF-SLP Speech Language Pathology, Acute Rehabilitation Services  Secure Chat preferred [phone]  09/09/2022, 4:10 PM

## 2022-09-09 NOTE — Assessment & Plan Note (Addendum)
No change - IV fluids per nephrology

## 2022-09-09 NOTE — Progress Notes (Signed)
[facility] Progress Note   Subjective:   Having pain assoc with hemorrhoids.  I/Os doc yesterday 177mL/250mL.  Purewick in place.  Appetite fair. Denies dysgeusia, hiccups, confusion, itching.   Objective Vitals:   09/08/22 2017 09/08/22 2352 09/09/22 0344 09/09/22 0802  BP: (!) 148/51 (!) 137/47 (!) 133/51 (!) 143/51  Pulse: (!) 50 (!) 50 (!) 51 (!) 55  Resp: 16 16 16    Temp: (!) 97.4 F (36.3 C) (!) 97.5 F (36.4 C) 98 F (36.7 C) 97.7 F (36.5 C)  TempSrc: Oral Oral  Oral  SpO2: 96% 94% 95% 95%  Weight:      Height:       Physical Exam Gen: lying in bed in discomfort from hemorrhoids Eyes: anicteric, EOMI ENT: MM tacky Neck: no JVD CV:  brady and regular, no rub  Abd: soft, nontender Back:CTAB GU: purewick with yellow urine in cannister  Neuro: conversant, awake and alert Skin: no rashes noted  Additional Objective Labs: Basic Metabolic Panel: Recent Labs  Lab 09/07/22 0644 09/08/22 0244 09/09/22 0307  NA 128* 126* 124*  K 5.4* 4.7 5.0  CL 93* 96* 93*  CO2 23 22 20*  GLUCOSE 236* 169* 292*  BUN 78* 75* 80*  CREATININE 4.61* 4.70* 4.84*  CALCIUM 8.2* 7.9* 7.8*    Liver Function Tests: Recent Labs  Lab 09/06/22 0944  AST 20  ALT 17  ALKPHOS 128*  BILITOT 0.8  PROT 6.7  ALBUMIN 1.8*    No results for input(s): "LIPASE", "AMYLASE" in the last 168 hours. CBC: Recent Labs  Lab 09/06/22 0944 09/07/22 0644 09/08/22 0244 09/09/22 0307  WBC 26.7* 23.7* 20.5* 18.0*  NEUTROABS 25.4*  --   --   --   HGB 8.0* 7.1* 8.1* 9.0*  HCT 24.9* 21.6* 24.6* 27.6*  MCV 85.9 85.7 83.7 86.8  PLT 495* 448* 402* 408*    Blood Culture    Component Value Date/Time   SDES BLOOD LEFT ARM 09/07/2022 0644   SPECREQUEST  09/07/2022 0644    BOTTLES DRAWN AEROBIC AND ANAEROBIC Blood Culture adequate volume   CULT  09/07/2022 0644    NO GROWTH 2 DAYS Performed at [facility], [address] [place], [place] [address]    REPTSTATUS PENDING  09/07/2022 [phone]    Cardiac Enzymes: No results for input(s): "CKTOTAL", "CKMB", "CKMBINDEX", "TROPONINI" in the last 168 hours. CBG: Recent Labs  Lab 09/07/22 1617 09/08/22 0638 09/08/22 1224 09/08/22 1525 09/09/22 0613  GLUCAP 185* 164* 173* 231* 297*    Iron Studies:  Recent Labs    09/08/22 0244  IRON 20*  TIBC 157*  FERRITIN 136    @lablastinr3 @ Studies/Results: US RENAL  Result Date: 09/07/2022 CLINICAL DATA:  Chronic kidney disease stage 3 Diabetes Hypertension EXAM: RENAL / URINARY TRACT ULTRASOUND COMPLETE COMPARISON:  CT chest abdomen pelvis 07/20/2022 FINDINGS: Right Kidney: Renal measurements: 9.2 x 3.8 x 6.0 cm = volume: 109 mL. Echogenicity within normal limits. No mass or hydronephrosis visualized. 2.8 cm simple cyst in the upper pole does not require dedicated imaging follow-up. 1.5 cm hypoechoic structure in the lower pole of the right kidney does not appear anechoic. Left Kidney: Renal measurements: 10.8 x 6.0 x 5.3 cm = volume: 179 mL. Echogenicity within normal limits. No mass. Mild left hydronephrosis, similar to prior exam. 3.4 cm simple cyst in the lower pole does not require dedicated imaging follow-up. Bladder: Evaluation limited due to under distension. Other: None. IMPRESSION: 1. Unchanged mild left hydronephrosis.  2. 1.5 cm hypoechoic structure in the lower pole of the right kidney can not be characterized as a simple cyst given its internal echoes. It is more likely to be a mildly complicated cyst with proteinaceous or hemorrhagic debris thin a neoplasm. Recommend follow-up ultrasound in 6 months to again evaluate this finding. Electronically Signed   By: Acquanetta Belling M.D.   On: 09/07/2022 11:52   Medications:  ferric gluconate (FERRLECIT) IVPB 250 mg (09/08/22 1353)   meropenem (MERREM) IV 1 g (09/09/22 1610)    amiodarone  200 mg Oral Daily   apixaban  2.5 mg Oral BID   atorvastatin  40 mg Oral Daily   ezetimibe  10 mg Oral Daily   hydrocortisone  cream   Topical TID   insulin aspart  0-9 Units Subcutaneous TID WC   insulin aspart protamine- aspart  13 Units Subcutaneous BID WC   metoprolol succinate  25 mg Oral Daily   witch hazel-glycerin   Topical TID    Assessment/PlanVirginia E Flannigan is an 82 y.o. female atrial fibrillation, DM type 2, HFrEF, HTN, HL,  currently admitted for ESBL E coli UTI and CVA for whom nephrology is consulted for evaluation and management of AKI on CKD.    **AKI on CKD:  baseline Cr was in mid 2s > 08/2022 admission 2.6 > 3.6 > 3 now presenting with Cr 4.55 > 4.6 > 4.7 today.  Says no h/o outpt nephrology - confirmed with husband today.  UA consistent with UTI.  Imaging with no new findings - do not suspect mild hydro on L significantly contributing.  Modest hypotension noted - suspect ATN related to acute infection + hypovolemia.  In setting of cont worsening and worsening hypoNa will give gentle fluid challenge today. No indications for dialysis currently and hopefully will see some improvement with supportive care.  D/w husband who is agreeable to dialysis if needed.   Avoid hypotension and nephrotoxins.    **acute CVA: post central gyrus, neurology following.  PT/OT have been consulted.    **Hyponatremia:  worsening in past 24h (some effect of BG 292 this AM but doesn't fully correct). Suspect mild hypovolemia but also impaired free water handling with severe AKI.  Gentle fluids x 18h.   **Anemia, normocytic:  Baseline in the past few months 8-9s.  Currently 7-8s.  Denies overt bleeding.   Iron sat 11% - give IV iron while admitted, other nutritional deficiencies being screened.  Consider ESA if not trending up.    **HFrEF:  EF 35% 08/2022.  Judicious fluids as above.  Meds limited by GFR.     **UTI: UA consistent, h/o ESBL E coli.  On merrem.  Klebsiella, sens only to imipenem.   **R renal hypoechoic structure:  rads rec 8mo f/u US ensure stable   **DM: insulin per primary.    **A fib:  on eliquis, BB,  amiodarone.    Will follow, call with concerns.   Estill Bakes MD 09/09/2022, 9:49 AM  [facility] Pager: [phone]

## 2022-09-09 NOTE — Assessment & Plan Note (Signed)
No longer on AED

## 2022-09-09 NOTE — Assessment & Plan Note (Signed)
BP normal -Continue metoprolol 

## 2022-09-09 NOTE — Assessment & Plan Note (Signed)
Hgb stable, iron replete

## 2022-09-10 DIAGNOSIS — I5022 Chronic systolic (congestive) heart failure: Secondary | ICD-10-CM

## 2022-09-10 DIAGNOSIS — Z9861 Coronary angioplasty status: Secondary | ICD-10-CM

## 2022-09-10 DIAGNOSIS — N1832 Chronic kidney disease, stage 3b: Secondary | ICD-10-CM

## 2022-09-10 DIAGNOSIS — I10 Essential (primary) hypertension: Secondary | ICD-10-CM

## 2022-09-10 DIAGNOSIS — Z794 Long term (current) use of insulin: Secondary | ICD-10-CM

## 2022-09-10 DIAGNOSIS — R7881 Bacteremia: Secondary | ICD-10-CM | POA: Diagnosis not present

## 2022-09-10 DIAGNOSIS — E1122 Type 2 diabetes mellitus with diabetic chronic kidney disease: Secondary | ICD-10-CM

## 2022-09-10 DIAGNOSIS — I639 Cerebral infarction, unspecified: Secondary | ICD-10-CM | POA: Diagnosis not present

## 2022-09-10 DIAGNOSIS — N179 Acute kidney failure, unspecified: Secondary | ICD-10-CM | POA: Diagnosis not present

## 2022-09-10 DIAGNOSIS — E871 Hypo-osmolality and hyponatremia: Secondary | ICD-10-CM | POA: Diagnosis not present

## 2022-09-10 DIAGNOSIS — I2510 Atherosclerotic heart disease of native coronary artery without angina pectoris: Secondary | ICD-10-CM

## 2022-09-10 LAB — CBC
HCT: 25.3 % — ABNORMAL LOW (ref 36.0–46.0)
Hemoglobin: 8.3 g/dL — ABNORMAL LOW (ref 12.0–15.0)
MCH: 28 pg (ref 26.0–34.0)
MCHC: 32.8 g/dL (ref 30.0–36.0)
MCV: 85.5 fL (ref 80.0–100.0)
Platelets: 400 10*3/uL (ref 150–400)
RBC: 2.96 MIL/uL — ABNORMAL LOW (ref 3.87–5.11)
RDW: 17.2 % — ABNORMAL HIGH (ref 11.5–15.5)
WBC: 17 10*3/uL — ABNORMAL HIGH (ref 4.0–10.5)
nRBC: 0 % (ref 0.0–0.2)

## 2022-09-10 LAB — GLUCOSE, CAPILLARY
Glucose-Capillary: 161 mg/dL — ABNORMAL HIGH (ref 70–99)
Glucose-Capillary: 164 mg/dL — ABNORMAL HIGH (ref 70–99)
Glucose-Capillary: 162 mg/dL — ABNORMAL HIGH (ref 70–99)
Glucose-Capillary: 146 mg/dL — ABNORMAL HIGH (ref 70–99)

## 2022-09-10 LAB — BASIC METABOLIC PANEL
Anion gap: 7 (ref 5–15)
BUN: 83 mg/dL — ABNORMAL HIGH (ref 8–23)
CO2: 21 mmol/L — ABNORMAL LOW (ref 22–32)
Calcium: 7.6 mg/dL — ABNORMAL LOW (ref 8.9–10.3)
Chloride: 97 mmol/L — ABNORMAL LOW (ref 98–111)
Creatinine, Ser: 4.85 mg/dL — ABNORMAL HIGH (ref 0.44–1.00)
GFR, Estimated: 9 mL/min — ABNORMAL LOW (ref >60)
Glucose, Bld: 149 mg/dL — ABNORMAL HIGH (ref 70–99)
Potassium: 4.7 mmol/L (ref 3.5–5.1)
Sodium: 125 mmol/L — ABNORMAL LOW (ref 135–145)

## 2022-09-10 MED ORDER — SODIUM CHLORIDE 0.9 % IV SOLN: INTRAVENOUS | Status: AC

## 2022-09-10 NOTE — Progress Notes (Signed)
Occupational Therapy Treatment Patient Details Name: [name] [name] MRN: [id number] DOB: March 26, 1940 Today's Date: 09/10/2022   History of present illness Pt is an 82 y/o female who presents s/p syncope and fall while walking to the car as family was attempting to bring her to the ED for vomiting, weakness. Of note, pt with recent admission with d/c 07/10/22 from [facility] for UTI and hematura with elevated INR (discharged with foley). PMH significant for A fib, L eye blindness, DM II, HTN, osteopenia, pelvic fracture 2018, seizures, systolic CHF, ankle fx surgery 2017, IM nail R in 2014 and L 2017, I&D hip 2017, B shoulder surgery.   OT comments  Patient lethargic initially but became more alert while performing bed mobility. Patient demonstrating good progress with min assist for bed mobility, sit to stands, and transfer to recliner with RW. Patient provided setup for self feeding and cues for place setup to allow patient to locate items. Patient able to feed self following setup. Patient will benefit from continued inpatient follow up therapy, <3 hours/day to address self feeding, grooming, bathing, dressing, and toilet transfers. Acute OT to continue to follow.    Recommendations for follow up therapy are one component of a multi-disciplinary discharge planning process, led by the attending physician.  Recommendations may be updated based on patient status, additional functional criteria and insurance authorization.    Assistance Recommended at Discharge Frequent or constant Supervision/Assistance  Patient can return home with the following  A lot of help with bathing/dressing/bathroom;A lot of help with walking and/or transfers   Equipment Recommendations  Hospital bed;Wheelchair cushion (measurements OT);Wheelchair (measurements OT);BSC/3in1    Recommendations for Other Services      Precautions / Restrictions Precautions Precautions: Fall Precaution Comments: L eye  blindness Restrictions Weight Bearing Restrictions: No       Mobility Bed Mobility Overal bed mobility: Needs Assistance Bed Mobility: Supine to Sit     Supine to sit: Min assist, HOB elevated     General bed mobility comments: cues for rail use and assistance scooting to EOB    Transfers Overall transfer level: Needs assistance Equipment used: Rolling walker (2 wheels) Transfers: Sit to/from Stand, Bed to chair/wheelchair/BSC Sit to Stand: Min assist     Step pivot transfers: Min assist     General transfer comment: cues for hand placement and min assist ot power up and manage RW     Balance Overall balance assessment: Needs assistance Sitting-balance support: Feet supported, No upper extremity supported Sitting balance-Leahy Scale: Fair Sitting balance - Comments: supervision for sitting on EOB   Standing balance support: Bilateral upper extremity supported Standing balance-Leahy Scale: Poor Standing balance comment: reliant on UE support with min guard for static standing                           ADL either performed or assessed with clinical judgement   ADL Overall ADL's : Needs assistance/impaired Eating/Feeding: Supervision/ safety;Set up;Sitting Eating/Feeding Details (indicate cue type and reason): cues for plate layout and setup provided Grooming: Wash/dry hands;Wash/dry face;Supervision/safety;Sitting Grooming Details (indicate cue type and reason): in recliner                                    Extremity/Trunk Assessment              Vision       Perception  Praxis      Cognition Arousal/Alertness: Awake/alert Behavior During Therapy: WFL for tasks assessed/performed Overall Cognitive Status: No family/caregiver present to determine baseline cognitive functioning                                 General Comments: lethargic initially but alert during session.        Exercises       Shoulder Instructions       General Comments      Pertinent Vitals/ Pain       Pain Assessment Pain Assessment: Faces Faces Pain Scale: Hurts even more Pain Location: bottom (hemorrhoids) Pain Descriptors / Indicators: Grimacing, Guarding, Sore, Discomfort Pain Intervention(s): Limited activity within patient's tolerance, Monitored during session, Repositioned  Home Living                                          Prior Functioning/Environment              Frequency  Min 2X/week        Progress Toward Goals  OT Goals(current goals can now be found in the care plan section)  Progress towards OT goals: Progressing toward goals  Acute Rehab OT Goals Patient Stated Goal: get better OT Goal Formulation: With patient Time For Goal Achievement: 09/21/22 Potential to Achieve Goals: Fair ADL Goals Pt Will Perform Eating: with modified independence;sitting;bed level Pt Will Perform Grooming: with set-up;sitting Pt Will Transfer to Toilet: with min assist;stand pivot transfer;bedside commode Additional ADL Goal #1: Pt to complete bed mobility with min guard in prep for ADLs  Plan Discharge plan remains appropriate    Co-evaluation                 AM-PAC OT "6 Clicks" Daily Activity     Outcome Measure   Help from another person eating meals?: A Little Help from another person taking care of personal grooming?: A Little Help from another person toileting, which includes using toliet, bedpan, or urinal?: Total Help from another person bathing (including washing, rinsing, drying)?: A Lot Help from another person to put on and taking off regular upper body clothing?: A Lot Help from another person to put on and taking off regular lower body clothing?: Total 6 Click Score: 12    End of Session Equipment Utilized During Treatment: Gait belt;Rolling walker (2 wheels)  OT Visit Diagnosis: Unsteadiness on feet (R26.81);Other abnormalities of gait  and mobility (R26.89);Muscle weakness (generalized) (M62.81)   Activity Tolerance Patient tolerated treatment well   Patient Left in chair;with call bell/phone within reach;with chair alarm set;with family/visitor present   Nurse Communication Mobility status        Time: 1327-1350 OT Time Calculation (min): 23 min  Charges: OT General Charges $OT Visit: 1 Visit OT Treatments $Self Care/Home Management : 8-22 mins $Therapeutic Activity: 8-22 mins  Alfonse Flavors, OTA Acute Rehabilitation Services  Office [phone]   Dewain Penning 09/10/2022, 2:11 PM

## 2022-09-10 NOTE — Plan of Care (Signed)
  Problem: Education: Goal: Ability to describe self-care measures that may prevent or decrease complications (Diabetes Survival Skills Education) will improve Outcome: Progressing   Problem: Coping: Goal: Ability to adjust to condition or change in health will improve Outcome: Progressing   Problem: Fluid Volume: Goal: Ability to maintain a balanced intake and output will improve Outcome: Progressing   Problem: Nutritional: Goal: Maintenance of adequate nutrition will improve Outcome: Progressing   Problem: Education: Goal: Knowledge of disease or condition will improve Outcome: Progressing   Problem: Ischemic Stroke/TIA Tissue Perfusion: Goal: Complications of ischemic stroke/TIA will be minimized 09/10/2022 0408 by Alita Chyle, RN Outcome: Progressing 09/09/2022 2152 by Alita Chyle, RN Outcome: Progressing   Problem: Coping: Goal: Will identify appropriate support needs Outcome: Progressing   Problem: Nutrition: Goal: Risk of aspiration will decrease Outcome: Progressing   Problem: Clinical Measurements: Goal: Respiratory complications will improve Outcome: Progressing   Problem: Health Behavior/Discharge Planning: Goal: Ability to manage health-related needs will improve Outcome: Not Progressing   Problem: Metabolic: Goal: Ability to maintain appropriate glucose levels will improve Outcome: Not Progressing   Problem: Nutritional: Goal: Progress toward achieving an optimal weight will improve Outcome: Not Progressing   Problem: Skin Integrity: Goal: Risk for impaired skin integrity will decrease Outcome: Not Progressing   Problem: Health Behavior/Discharge Planning: Goal: Ability to manage health-related needs will improve Outcome: Not Progressing

## 2022-09-10 NOTE — Plan of Care (Signed)
  Problem: Education: Goal: Ability to describe self-care measures that may prevent or decrease complications (Diabetes Survival Skills Education) will improve Outcome: Progressing Goal: Individualized Educational Video(s) Outcome: Progressing   Problem: Coping: Goal: Ability to adjust to condition or change in health will improve Outcome: Progressing   Problem: Fluid Volume: Goal: Ability to maintain a balanced intake and output will improve Outcome: Progressing   Problem: Health Behavior/Discharge Planning: Goal: Ability to identify and utilize available resources and services will improve Outcome: Progressing Goal: Ability to manage health-related needs will improve Outcome: Progressing   Problem: Metabolic: Goal: Ability to maintain appropriate glucose levels will improve Outcome: Progressing   Problem: Nutritional: Goal: Maintenance of adequate nutrition will improve Outcome: Progressing Goal: Progress toward achieving an optimal weight will improve Outcome: Progressing   Problem: Skin Integrity: Goal: Risk for impaired skin integrity will decrease Outcome: Progressing   Problem: Tissue Perfusion: Goal: Adequacy of tissue perfusion will improve Outcome: Progressing   Problem: Education: Goal: Knowledge of disease or condition will improve Outcome: Progressing Goal: Knowledge of secondary prevention will improve (MUST DOCUMENT ALL) Outcome: Progressing Goal: Knowledge of patient specific risk factors will improve (Mark N/A or DELETE if not current risk factor) Outcome: Progressing   Problem: Ischemic Stroke/TIA Tissue Perfusion: Goal: Complications of ischemic stroke/TIA will be minimized Outcome: Progressing   Problem: Coping: Goal: Will verbalize positive feelings about self Outcome: Progressing Goal: Will identify appropriate support needs Outcome: Progressing   Problem: Health Behavior/Discharge Planning: Goal: Ability to manage health-related needs  will improve Outcome: Progressing Goal: Goals will be collaboratively established with patient/family Outcome: Progressing   Problem: Self-Care: Goal: Ability to participate in self-care as condition permits will improve Outcome: Progressing Goal: Verbalization of feelings and concerns over difficulty with self-care will improve Outcome: Progressing Goal: Ability to communicate needs accurately will improve Outcome: Progressing   Problem: Nutrition: Goal: Risk of aspiration will decrease Outcome: Progressing Goal: Dietary intake will improve Outcome: Progressing   Problem: Education: Goal: Knowledge of General Education information will improve Description: Including pain rating scale, medication(s)/side effects and non-pharmacologic comfort measures Outcome: Progressing   Problem: Health Behavior/Discharge Planning: Goal: Ability to manage health-related needs will improve Outcome: Progressing   Problem: Clinical Measurements: Goal: Ability to maintain clinical measurements within normal limits will improve Outcome: Progressing Goal: Will remain free from infection Outcome: Progressing Goal: Diagnostic test results will improve Outcome: Progressing Goal: Respiratory complications will improve Outcome: Progressing Goal: Cardiovascular complication will be avoided Outcome: Progressing   Problem: Activity: Goal: Risk for activity intolerance will decrease Outcome: Progressing   Problem: Nutrition: Goal: Adequate nutrition will be maintained Outcome: Progressing   Problem: Coping: Goal: Level of anxiety will decrease Outcome: Progressing   Problem: Elimination: Goal: Will not experience complications related to bowel motility Outcome: Progressing Goal: Will not experience complications related to urinary retention Outcome: Progressing   Problem: Pain Managment: Goal: General experience of comfort will improve Outcome: Progressing   Problem: Safety: Goal:  Ability to remain free from injury will improve Outcome: Progressing   Problem: Skin Integrity: Goal: Risk for impaired skin integrity will decrease Outcome: Progressing   

## 2022-09-10 NOTE — Progress Notes (Signed)
[facility] Progress Note   Subjective:   Feeling improved.  I/Os doc yesterday 790 / .  Purewick in place.  Appetite fair. Denies dysgeusia, hiccups, confusion, itching.   Objective Vitals:   09/09/22 2000 09/10/22 0000 09/10/22 0500 09/10/22 0829  BP: (!) 131/51 (!) 143/54 (!) 137/47 (!) 141/56  Pulse: (!) 50 (!) 52 (!) 52 (!) 54  Resp: 18 18 18 20   Temp: 97.7 F (36.5 C) (!) 97.4 F (36.3 C) 97.6 F (36.4 C)   TempSrc: Oral     SpO2: 96% 96% 97% 97%  Weight:      Height:       Physical Exam Gen: lying in bed comfortably Eyes: anicteric, EOMI ENT: MM tacky Neck: no JVD CV:  brady and regular, no rub  Abd: soft, nontender Back:CTAB GU: purewick with yellow urine in cannister  Neuro: conversant, awake and alert Skin: no rashes noted  Additional Objective Labs: Basic Metabolic Panel: Recent Labs  Lab 09/08/22 0244 09/09/22 0307 09/10/22 0807  NA 126* 124* 125*  K 4.7 5.0 4.7  CL 96* 93* 97*  CO2 22 20* 21*  GLUCOSE 169* 292* 149*  BUN 75* 80* 83*  CREATININE 4.70* 4.84* 4.85*  CALCIUM 7.9* 7.8* 7.6*   Liver Function Tests: Recent Labs  Lab 09/06/22 0944  AST 20  ALT 17  ALKPHOS 128*  BILITOT 0.8  PROT 6.7  ALBUMIN 1.8*   No results for input(s): "LIPASE", "AMYLASE" in the last 168 hours. CBC: Recent Labs  Lab 09/06/22 0944 09/07/22 0644 09/08/22 0244 09/09/22 0307 09/10/22 0807  WBC 26.7* 23.7* 20.5* 18.0* 17.0*  NEUTROABS 25.4*  --   --   --   --   HGB 8.0* 7.1* 8.1* 9.0* 8.3*  HCT 24.9* 21.6* 24.6* 27.6* 25.3*  MCV 85.9 85.7 83.7 86.8 85.5  PLT 495* 448* 402* 408* 400   Blood Culture    Component Value Date/Time   SDES BLOOD LEFT ARM 09/07/2022 0644   SPECREQUEST  09/07/2022 0644    BOTTLES DRAWN AEROBIC AND ANAEROBIC Blood Culture adequate volume   CULT  09/07/2022 0644    NO GROWTH 3 DAYS Performed at [facility], [address], [place], [place] [address]    REPTSTATUS PENDING 09/07/2022 [phone]     Cardiac Enzymes: No results for input(s): "CKTOTAL", "CKMB", "CKMBINDEX", "TROPONINI" in the last 168 hours. CBG: Recent Labs  Lab 09/09/22 0613 09/09/22 1227 09/09/22 1721 09/09/22 2007 09/10/22 0606  GLUCAP 297* 266* 201* 215* 161*   Iron Studies:  Recent Labs    09/08/22 0244  IRON 20*  TIBC 157*  FERRITIN 136   @lablastinr3 @ Studies/Results: No results found. Medications:  ferric gluconate (FERRLECIT) IVPB Stopped (09/09/22 1435)   meropenem (MERREM) IV 1 g (09/10/22 8657)    amiodarone  200 mg Oral Daily   apixaban  2.5 mg Oral BID   atorvastatin  40 mg Oral Daily   ezetimibe  10 mg Oral Daily   hydrocortisone cream   Topical TID   insulin aspart  0-15 Units Subcutaneous TID WC   insulin aspart  0-5 Units Subcutaneous QHS   insulin aspart protamine- aspart  14 Units Subcutaneous BID WC   metoprolol succinate  25 mg Oral Daily   witch hazel-glycerin   Topical TID    Assessment/PlanVirginia E [name] is an 82 y.o. female atrial fibrillation, DM type 2, HFrEF, HTN, HL,  currently admitted for ESBL E coli UTI and CVA for whom nephrology is  consulted for evaluation and management of AKI on CKD.    **AKI on CKD:  baseline Cr was in mid 2s > 08/2022 admission 2.6 > 3.6 > 3 now presenting with Cr 4.55 > 4.6 > 4.7 > 4.8 today.  Says no h/o outpt nephrology - confirmed with husband today.  UA consistent with UTI.  Imaging with no new findings - do not suspect mild hydro on L significantly contributing.  Modest hypotension noted - suspect ATN related to acute infection + hypovolemia.  S/p IV fluid challenge and kidney function has stabilized --> no e/o overload and not robust po intake so cont x24h more.  No indications for dialysis currently and hopefully will see some improvement with supportive care.  D/w husband who is agreeable to dialysis if needed.   Avoid hypotension and nephrotoxins.    **acute CVA: post central gyrus, neurology following.  PT/OT have been consulted.     **Hyponatremia:  hypovolemic + AKI with free water handling impaired; improved slightly with isotonic fluids, cont x 24h.   **Anemia, normocytic:  Baseline in the past few months 8-9s.  Currently 7-8s.  Denies overt bleeding.   Iron sat 11% - give IV iron while admitted, other nutritional deficiencies being screened.  Consider ESA if not trending up.    **HFrEF:  EF 35% 08/2022.  Judicious fluids as above.  Meds limited by GFR.     **UTI: UA consistent, h/o ESBL E coli.  On merrem.  Klebsiella, sens only to imipenem.   **R renal hypoechoic structure:  rads rec 73mo f/u US ensure stable   **DM: insulin per primary.    **A fib:  on eliquis, BB, amiodarone.    Will follow, call with concerns.   Estill Bakes MD 09/10/2022, 9:40 AM  [facility] Pager: [phone]

## 2022-09-10 NOTE — Progress Notes (Signed)
Progress Note   Patient: [name] [name] ZOX:[id number] DOB: [birth year] DOA: 09/06/2022     4 DOS: the patient was seen and examined on 09/10/2022 at 9:30AM      Brief hospital course: [name] [name] is an 82 y.o. F with CKD IV baseline ~1.7-2.2, DM, sCHF EF 35-40%, hx HSV encephalitis 2014, pAF on Eliquis, seizures, hx ESBL UTI who presented with dizziness, found to have stroke, acute renal failure and ESBL bacteremia.    Significant events: 7/7: Admitted for stroke, on antibiotics/gentle IVF; Neuro consulted 7/8: Nephrology consulted; BCx growing Klebsiella 7/10: Cr worse, IVF restarted    Significant studies: 7/7: CXR -- perihilar opacities, c/w infection or edema 7/7: MRI brain/MRA H&N -- small R postcentral gyrus infarct, no significant vessel disease  7/8: US renal -- unchanged mild L hydro, 1.5 cm cyst   Significant microbiology data: 7/7 BCx x2: ESBL Klebsiella in 2/2 7/7 UCx: ESBL Klebsiella 7/8 BCx x2: NG    Procedures: None    Consults: Neurology Nephrology      Assessment and Plan: * Acute renal failure superimposed on stage 4 chronic kidney disease (HCC) SPEP negative.  [place] without clinically significant disease.  Fluids restarted 7/10, UOP up to 650cc.  Cr stable  - Consult Nephrology - IVF per Neph - Strict I/Os - Hold furosemide    Acute CVA (cerebrovascular accident) (HCC) MRI brain showed right post-central gyrus infarct - Non-invasive angiography showed no significant disease - Echocardiogram completed 1 month ago showed no cardiogenic source of embolism - Lipids ordered: LDL undetectable - Continue Lipitor, Zetia - Aspirin deferred in place of Eliquis - Afib known, stable on Eliquis and amiodarone - tPA not given because outside window - Dysphagia screen completed - PT eval ordered: recommend SNF  ESBL Bacteremia due to UTI - Continue Meropenem, day 5 of 7  Hyponatremia Stable today  - IVF per Nephrology - Trend Na  closely  Chronic systolic CHF (congestive heart failure) (HCC) Appears euvolemic - Hold furosemide - Continue metoprolol  Anemia in chronic kidney disease (CKD) Hgb stable, iron replete  CKD (chronic kidney disease) stage 4, GFR 15-29 ml/min (HCC)    CAD S/P percutaneous coronary angioplasty - Continue cholesterol medicines, metoprolol, Eliquis  Diabetes (HCC) Glucoses better today - Continue NPH - Continue higher SS scale   Seizure disorder (HCC) No longer on AED  Paroxysmal atrial fibrillation (HCC) Rate controlled - Continue amiodarone, Eliquis, metoprolol  Essential hypertension BP normal - Continue metoprolol  Hyperlipidemia - Continue Lipitor and Zetia          Subjective: Patient has no complaints.  Urine output slightly better yesterday on IV fluids.  No nursing concerns, no fever.     Physical Exam: BP (!) 116/56 (BP Location: Left Arm)   Pulse 76   Temp 99 F (37.2 C) (Oral)   Resp 20   Ht 5\' 9"  (1.753 m)   Wt 76 kg   SpO2 97%   BMI 24.74 kg/m   Elderly adult female, lying in bed, no acute distress RRR, no murmurs, no peripheral edema Respiratory normal, lungs clear without rales or wheezes Abdomen soft without tenderness palpation or guarding, no ascites or distention   Data Reviewed: Patient metabolic panel shows creatinine stable at 4.85 Sodium up to 125 CBC shows leukocytosis down to 17 Hemoglobin stable at 8.3  Family Communication: None present    Disposition: Status is: Inpatient To SNF once renal situation clarifies and complete IV antibiotics (Saturday)  Author: Alberteen Sam, MD 09/10/2022 12:53 PM  For on call review [email or web address].

## 2022-09-10 NOTE — Progress Notes (Addendum)
Pharmacy Antibiotic Note  [name] [name] is a 82 y.o. female admitted on 09/06/2022 with UTI.  Pharmacy has been consulted for meropenem dosing for ESBL UTI.   Plan: Meropenem 1 gm IV q24 for CrCl < 10 ml/min Continue for a total of 7 days (stop date 7/14 added)    Height: 5\' 9"  (175.3 cm) Weight: 76 kg (167 lb 8.8 oz) IBW/kg (Calculated) : 66.2  Temp (24hrs), Avg:97.6 F (36.4 C), Min:97.4 F (36.3 C), Max:97.7 F (36.5 C)  Recent Labs  Lab 09/06/22 0944 09/06/22 1012 09/06/22 1300 09/07/22 0644 09/08/22 0244 09/09/22 0307 09/10/22 0807  WBC 26.7*  --   --  23.7* 20.5* 18.0* 17.0*  CREATININE 4.55*  --   --  4.61* 4.70* 4.84* 4.85*  LATICACIDVEN  --  1.5 1.8  --   --   --   --     Estimated Creatinine Clearance: 9.7 mL/min (A) (by C-G formula based on SCr of 4.85 mg/dL (H)).    Allergies  Allergen Reactions   Procaine Hcl Anaphylaxis   Amoxicillin Itching    Antimicrobials this admission: 7/8 meropenem>> (stop date of 7/14)   Dose adjustments this admission:  Microbiology results: 7/8 repeat Bcx: NGTD 7/7 UCx:  >100K Kleb pneumo, cx reincubated 7/7 BCx2: Kleb pneumo, gram positive rods (1 bottle) -BCID: Kleb pneumo, ESBL   07/20/22 Cath UCx: 80 K ESBL Kleb pneumo, R to all x imipenem  Thank you for involving pharmacy in the patient's care.   Theotis Burrow, PharmD PGY1 Acute Care Pharmacy Resident  09/10/2022 9:40 AM

## 2022-09-10 NOTE — Plan of Care (Signed)
  Problem: Education: Goal: Ability to describe self-care measures that may prevent or decrease complications (Diabetes Survival Skills Education) will improve Outcome: Progressing   Problem: Health Behavior/Discharge Planning: Goal: Ability to identify and utilize available resources and services will improve Outcome: Progressing   Problem: Nutritional: Goal: Maintenance of adequate nutrition will improve Outcome: Progressing   Problem: Skin Integrity: Goal: Risk for impaired skin integrity will decrease Outcome: Progressing   Problem: Education: Goal: Knowledge of disease or condition will improve Outcome: Progressing   Problem: Ischemic Stroke/TIA Tissue Perfusion: Goal: Complications of ischemic stroke/TIA will be minimized Outcome: Progressing   Problem: Coping: Goal: Will verbalize positive feelings about self Outcome: Progressing Goal: Will identify appropriate support needs Outcome: Progressing

## 2022-09-11 ENCOUNTER — Inpatient Hospital Stay (HOSPITAL_COMMUNITY): Payer: Medicare Other

## 2022-09-11 DIAGNOSIS — I639 Cerebral infarction, unspecified: Secondary | ICD-10-CM | POA: Diagnosis not present

## 2022-09-11 DIAGNOSIS — E871 Hypo-osmolality and hyponatremia: Secondary | ICD-10-CM | POA: Diagnosis not present

## 2022-09-11 DIAGNOSIS — E875 Hyperkalemia: Secondary | ICD-10-CM | POA: Insufficient documentation

## 2022-09-11 DIAGNOSIS — N179 Acute kidney failure, unspecified: Secondary | ICD-10-CM | POA: Diagnosis not present

## 2022-09-11 DIAGNOSIS — R7881 Bacteremia: Secondary | ICD-10-CM | POA: Diagnosis not present

## 2022-09-11 LAB — BLOOD CULTURE ID PANEL (REFLEXED) - BCID2

## 2022-09-11 LAB — CULTURE, BLOOD (ROUTINE X 2)

## 2022-09-11 LAB — CBC
HCT: 26.5 % — ABNORMAL LOW (ref 36.0–46.0)
Hemoglobin: 8.3 g/dL — ABNORMAL LOW (ref 12.0–15.0)
MCH: 27.2 pg (ref 26.0–34.0)
MCHC: 31.3 g/dL (ref 30.0–36.0)
MCV: 86.9 fL (ref 80.0–100.0)
Platelets: 401 10*3/uL — ABNORMAL HIGH (ref 150–400)
RBC: 3.05 MIL/uL — ABNORMAL LOW (ref 3.87–5.11)
RDW: 17.3 % — ABNORMAL HIGH (ref 11.5–15.5)
WBC: 17.8 10*3/uL — ABNORMAL HIGH (ref 4.0–10.5)
nRBC: 0 % (ref 0.0–0.2)

## 2022-09-11 LAB — GLUCOSE, CAPILLARY
Glucose-Capillary: 158 mg/dL — ABNORMAL HIGH (ref 70–99)
Glucose-Capillary: 139 mg/dL — ABNORMAL HIGH (ref 70–99)
Glucose-Capillary: 113 mg/dL — ABNORMAL HIGH (ref 70–99)
Glucose-Capillary: 101 mg/dL — ABNORMAL HIGH (ref 70–99)

## 2022-09-11 LAB — BASIC METABOLIC PANEL
Anion gap: 10 (ref 5–15)
BUN: 89 mg/dL — ABNORMAL HIGH (ref 8–23)
CO2: 18 mmol/L — ABNORMAL LOW (ref 22–32)
Calcium: 7.8 mg/dL — ABNORMAL LOW (ref 8.9–10.3)
Chloride: 97 mmol/L — ABNORMAL LOW (ref 98–111)
Creatinine, Ser: 4.86 mg/dL — ABNORMAL HIGH (ref 0.44–1.00)
GFR, Estimated: 9 mL/min — ABNORMAL LOW (ref >60)
Glucose, Bld: 158 mg/dL — ABNORMAL HIGH (ref 70–99)
Potassium: 5.3 mmol/L — ABNORMAL HIGH (ref 3.5–5.1)
Sodium: 125 mmol/L — ABNORMAL LOW (ref 135–145)

## 2022-09-11 MED ORDER — SENNOSIDES-DOCUSATE SODIUM 8.6-50 MG PO TABS: 1.0000 | ORAL_TABLET | Freq: Two times a day (BID) | ORAL | Status: DC | PRN

## 2022-09-11 MED ORDER — SODIUM ZIRCONIUM CYCLOSILICATE 10 G PO PACK
10.0000 g | PACK | Freq: Once | ORAL | Status: AC
  Administered 2022-09-11: 10 g via ORAL
  Filled 2022-09-11: qty 1

## 2022-09-11 MED ORDER — SODIUM BICARBONATE 650 MG PO TABS
650.0000 mg | ORAL_TABLET | Freq: Two times a day (BID) | ORAL | Status: DC
  Administered 2022-09-11 – 2022-09-18 (×14): 650 mg via ORAL
  Filled 2022-09-11 (×14): qty 1

## 2022-09-11 MED ORDER — POLYETHYLENE GLYCOL 3350 17 G PO PACK
17.0000 g | PACK | Freq: Every day | ORAL | Status: DC
  Administered 2022-09-12 – 2022-09-23 (×8): 17 g via ORAL
  Filled 2022-09-11 (×11): qty 1

## 2022-09-11 NOTE — Care Plan (Signed)
Wheezing and SOB this afternoon.  SpO2 down to 88% so placed on O2.  Fluids already off.  BP (!) 132/44 (BP Location: Left Arm)   Pulse 62   Temp (!) 97.5 F (36.4 C)   Resp 18   Ht 5\' 9"  (1.753 m)   Wt 76 kg   SpO2 97%   BMI 24.74 kg/m    Will check CXR 2V

## 2022-09-11 NOTE — Progress Notes (Signed)
1555 Pt has left floor to radiology  1625 Pt has returned to floor from radiology

## 2022-09-11 NOTE — Progress Notes (Signed)
PT Cancellation Note  Patient Details Name: [name] [name] MRN: [id number] DOB: [birth year]   Cancelled Treatment:    Reason Eval/Treat Not Completed: Other (comment) Pt declined due to pain from hemorrhoids.  Tried to encourage pt, but pt does appear to be in significant pain and has had medication (RN was present.) Will f/u later date. Anise Salvo, PT Acute Rehab [facility] [phone]   Rayetta Humphrey 09/11/2022, 2:30 PM

## 2022-09-11 NOTE — Progress Notes (Signed)
  PHARMACY - PHYSICIAN COMMUNICATION CRITICAL VALUE ALERT - BLOOD CULTURE IDENTIFICATION (BCID)  [name] [name] is an 82 y.o. female who presented to [facility] on 09/06/2022 with a chief complaint of ESBL Klebsiella pneumoniae bacteremia.   Assessment:  82 year old female admitted with ESBL Klebsiella bacteremia secondary to a urinary source. This morning lab reports that her blood cultures collected on 7/8 were positive in 1/3 bottles for the same ESBL Klebsiella. On review, these blood cultures were drawn ~ 20 minutes after the first dose of meropenem.   Name of physician (or Provider) Contacted: [place]   Current antibiotics: meropenem   Changes to prescribed antibiotics recommended:  Continue with current plan of meropenem x 7 days  Blood cultures that were still positive were drawn only 20 minutes after the first dose of antibiotics which was not enough time for the antibiotics to be effective   Results for orders placed or performed during the hospital encounter of 09/06/22  Blood Culture ID Panel (Reflexed) (Collected: 09/07/2022  6:44 AM)  Result Value Ref Range   Enterococcus faecalis NOT DETECTED NOT DETECTED   Enterococcus Faecium NOT DETECTED NOT DETECTED   Listeria monocytogenes NOT DETECTED NOT DETECTED   Staphylococcus species NOT DETECTED NOT DETECTED   Staphylococcus aureus (BCID) NOT DETECTED NOT DETECTED   Staphylococcus epidermidis NOT DETECTED NOT DETECTED   Staphylococcus lugdunensis NOT DETECTED NOT DETECTED   Streptococcus species NOT DETECTED NOT DETECTED   Streptococcus agalactiae NOT DETECTED NOT DETECTED   Streptococcus pneumoniae NOT DETECTED NOT DETECTED   Streptococcus pyogenes NOT DETECTED NOT DETECTED   A.calcoaceticus-baumannii NOT DETECTED NOT DETECTED   Bacteroides fragilis NOT DETECTED NOT DETECTED   Enterobacterales DETECTED (A) NOT DETECTED   Enterobacter cloacae complex NOT DETECTED NOT DETECTED   Escherichia coli NOT DETECTED NOT DETECTED    Klebsiella aerogenes NOT DETECTED NOT DETECTED   Klebsiella oxytoca NOT DETECTED NOT DETECTED   Klebsiella pneumoniae DETECTED (A) NOT DETECTED   Proteus species NOT DETECTED NOT DETECTED   Salmonella species NOT DETECTED NOT DETECTED   Serratia marcescens NOT DETECTED NOT DETECTED   Haemophilus influenzae NOT DETECTED NOT DETECTED   Neisseria meningitidis NOT DETECTED NOT DETECTED   Pseudomonas aeruginosa NOT DETECTED NOT DETECTED   Stenotrophomonas maltophilia NOT DETECTED NOT DETECTED   Candida albicans NOT DETECTED NOT DETECTED   Candida auris NOT DETECTED NOT DETECTED   Candida glabrata NOT DETECTED NOT DETECTED   Candida krusei NOT DETECTED NOT DETECTED   Candida parapsilosis NOT DETECTED NOT DETECTED   Candida tropicalis NOT DETECTED NOT DETECTED   Cryptococcus neoformans/gattii NOT DETECTED NOT DETECTED   CTX-M ESBL DETECTED (A) NOT DETECTED   Carbapenem resistance IMP NOT DETECTED NOT DETECTED   Carbapenem resistance KPC NOT DETECTED NOT DETECTED   Carbapenem resistance NDM NOT DETECTED NOT DETECTED   Carbapenem resist OXA 48 LIKE NOT DETECTED NOT DETECTED   Carbapenem resistance VIM NOT DETECTED NOT DETECTED   Sharin Mons, PharmD, BCPS, BCIDP Infectious Diseases Clinical Pharmacist Phone: [phone] 09/11/2022  9:09 AM

## 2022-09-11 NOTE — Progress Notes (Signed)
[facility] Progress Note   Subjective:   Recurrent rectal pain today, having constipation.  No other new issues reported.   I/Os doc yesterday 2.1L / .  Purewick in place.  Appetite fair. Denies dysgeusia, hiccups, confusion, itching.   Objective Vitals:   09/10/22 2023 09/10/22 2339 09/11/22 0355 09/11/22 0900  BP: (!) 146/71 (!) 139/57 (!) 139/50 (!) 137/47  Pulse: (!) 53 (!) 52 (!) 54 60  Resp: 19 16 17    Temp: 97.6 F (36.4 C) (!) 97.3 F (36.3 C) (!) 97.5 F (36.4 C)   TempSrc:  Oral    SpO2: 97% 97% 96% 94%  Weight:      Height:       Physical Exam Gen: lying in bed uncomfortable with rectal pain Eyes: anicteric, EOMI ENT: MM tacky Neck: no JVD CV:  brady and regular, no rub  Abd: soft, nontender Back:CTAB GU: purewick with yellow urine in cannister  Neuro: conversant, awake and alert Skin: no rashes noted  Additional Objective Labs: Basic Metabolic Panel: Recent Labs  Lab 09/09/22 0307 09/10/22 0807 09/11/22 0703  NA 124* 125* 125*  K 5.0 4.7 5.3*  CL 93* 97* 97*  CO2 20* 21* 18*  GLUCOSE 292* 149* 158*  BUN 80* 83* 89*  CREATININE 4.84* 4.85* 4.86*  CALCIUM 7.8* 7.6* 7.8*   Liver Function Tests: Recent Labs  Lab 09/06/22 0944  AST 20  ALT 17  ALKPHOS 128*  BILITOT 0.8  PROT 6.7  ALBUMIN 1.8*   No results for input(s): "LIPASE", "AMYLASE" in the last 168 hours. CBC: Recent Labs  Lab 09/06/22 0944 09/07/22 0644 09/08/22 0244 09/09/22 0307 09/10/22 0807 09/11/22 0703  WBC 26.7* 23.7* 20.5* 18.0* 17.0* 17.8*  NEUTROABS 25.4*  --   --   --   --   --   HGB 8.0* 7.1* 8.1* 9.0* 8.3* 8.3*  HCT 24.9* 21.6* 24.6* 27.6* 25.3* 26.5*  MCV 85.9 85.7 83.7 86.8 85.5 86.9  PLT 495* 448* 402* 408* 400 401*   Blood Culture    Component Value Date/Time   SDES BLOOD LEFT ARM 09/07/2022 0644   SPECREQUEST  09/07/2022 0644    BOTTLES DRAWN AEROBIC AND ANAEROBIC Blood Culture adequate volume   CULT GRAM NEGATIVE RODS 09/07/2022  0644   REPTSTATUS PENDING 09/07/2022 0644    Cardiac Enzymes: No results for input(s): "CKTOTAL", "CKMB", "CKMBINDEX", "TROPONINI" in the last 168 hours. CBG: Recent Labs  Lab 09/10/22 0606 09/10/22 1220 09/10/22 1645 09/10/22 2133 09/11/22 0620  GLUCAP 161* 164* 162* 146* 158*   Iron Studies:  No results for input(s): "IRON", "TIBC", "TRANSFERRIN", "FERRITIN" in the last 72 hours.  @lablastinr3 @ Studies/Results: No results found. Medications:  sodium chloride 75 mL/hr at 09/11/22 0159   ferric gluconate (FERRLECIT) IVPB 250 mg (09/11/22 1127)   meropenem (MERREM) IV 1 g (09/11/22 0543)    amiodarone  200 mg Oral Daily   apixaban  2.5 mg Oral BID   atorvastatin  40 mg Oral Daily   ezetimibe  10 mg Oral Daily   hydrocortisone cream   Topical TID   insulin aspart  0-15 Units Subcutaneous TID WC   insulin aspart  0-5 Units Subcutaneous QHS   insulin aspart protamine- aspart  14 Units Subcutaneous BID WC   metoprolol succinate  25 mg Oral Daily   witch hazel-glycerin   Topical TID    Assessment/PlanVirginia E [name] is an 82 y.o. female atrial fibrillation, DM type 2, HFrEF, HTN, HL,  currently admitted  for ESBL E coli UTI and CVA for whom nephrology is consulted for evaluation and management of AKI on CKD.    **AKI on CKD:  baseline Cr was in mid 2s > 08/2022 admission 2.6 > 3.6 > 3 now presenting with Cr 4.55 > 4.6 > 4.7 > 4.8 > 4.8 today.  No nephrology care outpt.  UA consistent with UTI.  Imaging with no new findings - do not suspect mild hydro on L significantly contributing.  Modest hypotension noted - suspect ATN related to acute infection + hypovolemia.  S/p IV fluid challenge and kidney function has plateaued. Appearing euvolemic with good po intake so hold on IVF.  Noted decline in UOP yesterday but no indications for dialysis currently and hopefully will see some improvement with supportive care.  D/w husband who is agreeable to dialysis if needed.   Avoid hypotension  and nephrotoxins.    **acute CVA: post central gyrus, neurology following.  PT/OT have been consulted.    **Hyponatremia:  hypovolemic + AKI with free water handling impaired; improved slightly with isotonic fluids and stable at 125 today.  **Hyperkalemia: lokelma today   **Anemia, normocytic:  Baseline in the past few months 8-9s.  Currently 7-8s.  Denies overt bleeding.   Iron sat 11% - giving IV iron while admitted, other nutritional deficiencies being screened.  Consider ESA if not trending up.    **HFrEF:  EF 35% 08/2022.  No IVF or diuretics needed today. Meds limited by GFR.     **UTI: ESBL Klebsiella, sens only to imipenem. On meropenem.   **R renal hypoechoic structure:  rads rec 61mo f/u US ensure stable   **DM: insulin per primary.    **A fib:  on eliquis, BB, amiodarone.    Will follow, call with concerns.   Estill Bakes MD 09/11/2022, 11:35 AM  [facility] Pager: [phone]

## 2022-09-11 NOTE — Progress Notes (Signed)
Progress Note   Patient: [name] [name] GNF:[id number] DOB: [birth year] DOA: 09/06/2022     5 DOS: the patient was seen and examined on 09/11/2022 at 8:44 AM      Brief hospital course: Mrs. Slupski is an 82 y.o. F with CKD IV baseline ~1.7-2.2, DM, sCHF EF 35-40%, hx HSV encephalitis 2014, pAF on Eliquis, seizures, hx ESBL UTI who presented with dizziness, found to have stroke, acute renal failure and ESBL bacteremia.    Significant events: 7/7: Admitted for stroke, on antibiotics/gentle IVF; Neuro consulted 7/8: Nephrology consulted; BCx growing Klebsiella 7/10: Cr worse, IVF restarted    Significant studies: 7/7: CXR -- perihilar opacities, c/w infection or edema 7/7: MRI brain/MRA H&N -- small R postcentral gyrus infarct, no significant vessel disease  7/8: US renal -- unchanged mild L hydro, 1.5 cm cyst   Significant microbiology data: 7/7 BCx x2: ESBL Klebsiella in 2/2 7/7 UCx: ESBL Klebsiella 7/8 BCx x2: ESBL in 1/3 (although this was drawn in very close proximity to meropenem)    Procedures: None    Consults: Neurology Nephrology      Assessment and Plan: * Acute renal failure superimposed on stage 4 chronic kidney disease (HCC) SPEP negative.  [place] without clinically significant disease.  Fluids restarted 7/10, documented urine output minimal yesterday.  Cr no change, bicarb down to 18, BUN up to 89, K5.3 - Defer fluids, dialysis, bicarb, diuretics to nephrology    Acute CVA (cerebrovascular accident) (HCC) MRI brain showed right post-central gyrus infarct - Non-invasive angiography showed no significant disease - Echocardiogram completed 1 month ago showed no cardiogenic source of embolism - Lipids ordered: LDL undetectable - Continue Lipitor, Zetia - Aspirin deferred in place of Eliquis - Afib known, stable on Eliquis and amiodarone - tPA not given because outside window - Dysphagia screen completed - PT eval ordered: recommend SNF  ESBL  Bacteremia due to UTI Repeat blood cultures from 7/8 growing ESBL in 1 of 3, however this was drawn within minutes of first dose of meropenem, so I do not think change of plan is necessary - Continue Meropenem, day 6 of 7  Hyponatremia No change - IV fluids per nephrology    Chronic systolic CHF (congestive heart failure) (HCC) No significant fluid overload today either - Hold furosemide - Continue metoprolol  Anemia in chronic kidney disease (CKD) Hgb stable, iron replete  CKD (chronic kidney disease) stage 4, GFR 15-29 ml/min (HCC)    CAD S/P percutaneous coronary angioplasty - Continue cholesterol medicines, metoprolol, Eliquis  Diabetes (HCC) Glucoses controlled - Continue NPH - Continue sliding scale corrections   Seizure disorder (HCC) No longer on AED  Paroxysmal atrial fibrillation (HCC) Rate controlled - Continue amiodarone, Eliquis, metoprolol  Essential hypertension BP normal - Continue metoprolol  Hyperlipidemia - Continue Lipitor and Zetia          Subjective: Patient was kept up all night by alarming IV pole.  Has no other complaints.  Just appears weak and has malaise.  No fever, no nursing concerns     Physical Exam: BP (!) 137/47 (BP Location: Right Arm)   Pulse 60   Temp (!) 97.5 F (36.4 C)   Resp 17   Ht 5\' 9"  (1.753 m)   Wt 76 kg   SpO2 94%   BMI 24.74 kg/m   Elderly adult female, lying in bed, no acute distress, appears tired RRR, no murmurs, no peripheral edema Respiratory rate normal, lungs clear without rales or wheezes Abdomen soft  without tenderness palpation or guarding, no ascites or distention She responds to questions appropriately, oriented to self and place, tired and affect blunted, judgment and insight appear normal, generalized weakness    Data Reviewed: Patient metabolic panel shows creatinine slightly up to 4.86, sodium unchanged at 125, now potassium up to 5.3, bicarb down to 18, BUN up to 89 CBC shows  leukocytosis essentially unchanged      Disposition: Status is: Inpatient         Author: Alberteen Sam, MD 09/11/2022 10:30 AM  For on call review [email or web address].

## 2022-09-11 NOTE — TOC Progression Note (Signed)
Transition of Care [facility]) - Progression Note    Patient Details  Name: [name] [name] MRN: [id number] Date of Birth: 1940-08-25  [facility] of [facility]) CM/SW Contact  Baldemar Lenis, [place] Phone Number: 09/11/2022, 10:28 AM  Clinical Narrative:   CSW spoke with MD about medical readiness for discharge to SNF, and then provided [facility] with update. CSW to follow for transition to SNF when stable.    Expected Discharge Plan: Skilled Nursing Facility Barriers to Discharge: Continued Medical Work up, English as a second language teacher  Expected Discharge Plan and Services     Post Acute Care Choice: NA Living arrangements for the past 2 months: Single Family Home                                       Social Determinants of Health (SDOH) Interventions SDOH Screenings   Food Insecurity: No Food Insecurity (09/06/2022)  Housing: Low Risk  (09/06/2022)  Transportation Needs: No Transportation Needs (09/06/2022)  Utilities: Not At Risk (09/06/2022)  Alcohol Screen: Low Risk  (08/22/2020)  Depression (PHQ2-9): Low Risk  (08/21/2022)  Financial Resource Strain: Low Risk  (08/22/2020)  Physical Activity: Inactive (08/22/2020)  Social Connections: Moderately Integrated (08/22/2020)  Stress: No Stress Concern Present (08/22/2020)  Tobacco Use: Medium Risk (09/07/2022)    Readmission Risk Interventions    08/06/2022    3:31 PM 07/23/2022   12:43 PM  Readmission Risk Prevention Plan  Transportation Screening Complete Complete  PCP or Specialist Appt within 5-7 Days  Complete  PCP or Specialist Appt within 3-5 Days Complete   Home Care Screening  Complete  Medication Review (RN CM)  Referral to Pharmacy  HRI or Home Care Consult Complete   Social Work Consult for Recovery Care Planning/Counseling --   Palliative Care Screening Not Applicable   Medication Review Oceanographer) Complete

## 2022-09-11 NOTE — Assessment & Plan Note (Signed)
-   Lokelma per Nephrology

## 2022-09-11 NOTE — Progress Notes (Signed)
Speech Language Pathology Treatment: Cognitive-Linquistic  Patient Details Name: [name] [name] MRN: [id number] DOB: 1940-09-04 Today's Date: 09/11/2022 Time: 1203-1212 SLP Time Calculation (min) (ACUTE ONLY): 9 min  Assessment / Plan / Recommendation Clinical Impression  Pt reports her pain as a 6/10 today and was initially hesitant to participate in activities with SLP. SLP provided eduction regarding importance of targeting complex cognitive needs through treatment and pt agreed to participate in activities, although with her eyes closed. An auditory comprehension activity was presented due to pt's vision loss. Pt accurately answered 3/10 questions correctly after being read a short paragraph story given Mod verbal cues. Suspect pt's level of pain and overall effort may have impacted her performance today. Will continue to f/u to address pt's attention, memory, and problem solving through functional cognitive tasks.    HPI HPI: Pt is an 82 yo female presenting from home to ED with weakness, dizziness, difficulty ambulating x 2 months, and suspected recurrent ESBL UTI. MRI Brain revealed small acute infarct in R postcentral gyrus and large area of L temporal encephalomalacia correlating with history of remote herpes encephalitis. PMH includes afib, CKD stage III, insulin-dependent DM2, CHF with reduced EF, HTN, HLD      SLP Plan  Continue with current plan of care      Recommendations for follow up therapy are one component of a multi-disciplinary discharge planning process, led by the attending physician.  Recommendations may be updated based on patient status, additional functional criteria and insurance authorization.    Recommendations                     Oral care BID   Frequent or constant Supervision/Assistance Cognitive communication deficit (W29.562)     Continue with current plan of care     Gwynneth Aliment, M.A., CF-SLP Speech Language Pathology, Acute  Rehabilitation Services  Secure Chat preferred [phone]   09/11/2022, 12:16 PM

## 2022-09-12 DIAGNOSIS — N179 Acute kidney failure, unspecified: Secondary | ICD-10-CM | POA: Diagnosis not present

## 2022-09-12 DIAGNOSIS — E871 Hypo-osmolality and hyponatremia: Secondary | ICD-10-CM | POA: Diagnosis not present

## 2022-09-12 DIAGNOSIS — R7881 Bacteremia: Secondary | ICD-10-CM | POA: Diagnosis not present

## 2022-09-12 DIAGNOSIS — I639 Cerebral infarction, unspecified: Secondary | ICD-10-CM | POA: Diagnosis not present

## 2022-09-12 LAB — CULTURE, BLOOD (ROUTINE X 2)
Culture: NO GROWTH
Special Requests: ADEQUATE
Special Requests: ADEQUATE

## 2022-09-12 LAB — RENAL FUNCTION PANEL
Albumin: <1.5 g/dL — ABNORMAL LOW (ref 3.5–5.0)
Anion gap: 12 (ref 5–15)
BUN: 102 mg/dL — ABNORMAL HIGH (ref 8–23)
CO2: 19 mmol/L — ABNORMAL LOW (ref 22–32)
Calcium: 8.1 mg/dL — ABNORMAL LOW (ref 8.9–10.3)
Chloride: 98 mmol/L (ref 98–111)
Creatinine, Ser: 4.88 mg/dL — ABNORMAL HIGH (ref 0.44–1.00)
GFR, Estimated: 8 mL/min — ABNORMAL LOW (ref >60)
Glucose, Bld: 98 mg/dL (ref 70–99)
Phosphorus: 4.1 mg/dL (ref 2.5–4.6)
Potassium: 5.3 mmol/L — ABNORMAL HIGH (ref 3.5–5.1)
Sodium: 129 mmol/L — ABNORMAL LOW (ref 135–145)

## 2022-09-12 LAB — CBC
HCT: 22.1 % — ABNORMAL LOW (ref 36.0–46.0)
Hemoglobin: 7 g/dL — ABNORMAL LOW (ref 12.0–15.0)
MCH: 27.9 pg (ref 26.0–34.0)
MCHC: 31.7 g/dL (ref 30.0–36.0)
MCV: 88 fL (ref 80.0–100.0)
Platelets: 393 10*3/uL (ref 150–400)
RBC: 2.51 MIL/uL — ABNORMAL LOW (ref 3.87–5.11)
RDW: 17.6 % — ABNORMAL HIGH (ref 11.5–15.5)
WBC: 19.3 10*3/uL — ABNORMAL HIGH (ref 4.0–10.5)
nRBC: 0.2 % (ref 0.0–0.2)

## 2022-09-12 LAB — GLUCOSE, CAPILLARY
Glucose-Capillary: 98 mg/dL (ref 70–99)
Glucose-Capillary: 115 mg/dL — ABNORMAL HIGH (ref 70–99)
Glucose-Capillary: 139 mg/dL — ABNORMAL HIGH (ref 70–99)
Glucose-Capillary: 93 mg/dL (ref 70–99)

## 2022-09-12 LAB — HEPATITIS B SURFACE ANTIGEN: Hepatitis B Surface Ag: NONREACTIVE

## 2022-09-12 MED ORDER — APIXABAN 2.5 MG PO TABS
2.5000 mg | ORAL_TABLET | Freq: Two times a day (BID) | ORAL | Status: DC
  Administered 2022-09-12 – 2022-09-13 (×3): 2.5 mg via ORAL
  Filled 2022-09-12 (×3): qty 1

## 2022-09-12 MED ORDER — LIDOCAINE-PRILOCAINE 2.5-2.5 % EX CREA: 1.0000 | TOPICAL_CREAM | CUTANEOUS | Status: DC | PRN

## 2022-09-12 MED ORDER — SODIUM ZIRCONIUM CYCLOSILICATE 10 G PO PACK
10.0000 g | PACK | Freq: Once | ORAL | Status: AC
  Administered 2022-09-12: 10 g via ORAL
  Filled 2022-09-12: qty 1

## 2022-09-12 MED ORDER — PENTAFLUOROPROP-TETRAFLUOROETH EX AERO: 1.0000 | INHALATION_SPRAY | CUTANEOUS | Status: DC | PRN

## 2022-09-12 MED ORDER — ANTICOAGULANT SODIUM CITRATE 4% (200MG/5ML) IV SOLN
5.0000 mL | Status: DC | PRN
  Filled 2022-09-12: qty 5

## 2022-09-12 MED ORDER — LIDOCAINE HCL (PF) 1 % IJ SOLN: 5.0000 mL | INTRAMUSCULAR | Status: DC | PRN

## 2022-09-12 MED ORDER — ALTEPLASE 2 MG IJ SOLR: 2.0000 mg | Freq: Once | INTRAMUSCULAR | Status: DC | PRN

## 2022-09-12 MED ORDER — INSULIN ASPART PROT & ASPART (70-30 MIX) 100 UNIT/ML ~~LOC~~ SUSP
10.0000 [IU] | Freq: Two times a day (BID) | SUBCUTANEOUS | Status: DC
  Administered 2022-09-12 – 2022-09-17 (×10): 10 [IU] via SUBCUTANEOUS
  Filled 2022-09-12: qty 10

## 2022-09-12 MED ORDER — CHLORHEXIDINE GLUCONATE CLOTH 2 % EX PADS
6.0000 | MEDICATED_PAD | Freq: Every day | CUTANEOUS | Status: DC
  Administered 2022-09-13 – 2022-09-24 (×12): 6 via TOPICAL

## 2022-09-12 MED ORDER — HEPARIN SODIUM (PORCINE) 1000 UNIT/ML DIALYSIS
1000.0000 [IU] | INTRAMUSCULAR | Status: DC | PRN
  Administered 2022-09-14: 2.4 [IU]

## 2022-09-12 MED ORDER — DARBEPOETIN ALFA 60 MCG/0.3ML IJ SOSY
60.0000 ug | PREFILLED_SYRINGE | INTRAMUSCULAR | Status: DC
  Administered 2022-09-12 – 2022-09-19 (×2): 60 ug via SUBCUTANEOUS
  Filled 2022-09-12 (×2): qty 0.3

## 2022-09-12 NOTE — Progress Notes (Signed)
[facility] Progress Note   Subjective:   No major new issues but endorsing poor appetite now, some mild nausea, some hiccups and mental "fogginess".  UOP yesterday.    Objective Vitals:   09/11/22 2034 09/12/22 0009 09/12/22 0428 09/12/22 0742  BP: 132/60 (!) 130/45 (!) 132/53 (!) 133/42  Pulse: 62 64 69 72  Resp: 19 14 19 18   Temp: 98.2 F (36.8 C) 98 F (36.7 C) (!) 97.4 F (36.3 C) (!) 97.5 F (36.4 C)  TempSrc: Oral  Oral Oral  SpO2: 100% 97% 97% 95%  Weight:      Height:       Physical Exam Gen: lying in bed comfortably Eyes: anicteric, EOMI ENT: MMM Neck: no JVD CV:  RRR, no rub  Abd: soft, nontender Back:CTAB GU: purewick with yellow urine in cannister  Extr: 1+ LE edema Neuro: conversant, awake and alert Skin: no rashes noted  Additional Objective Labs: Basic Metabolic Panel: Recent Labs  Lab 09/10/22 0807 09/11/22 0703 09/12/22 0601  NA 125* 125* 129*  K 4.7 5.3* 5.3*  CL 97* 97* 98  CO2 21* 18* 19*  GLUCOSE 149* 158* 98  BUN 83* 89* 102*  CREATININE 4.85* 4.86* 4.88*  CALCIUM 7.6* 7.8* 8.1*  PHOS  --   --  4.1   Liver Function Tests: Recent Labs  Lab 09/06/22 0944 09/12/22 0601  AST 20  --   ALT 17  --   ALKPHOS 128*  --   BILITOT 0.8  --   PROT 6.7  --   ALBUMIN 1.8* <1.5*   No results for input(s): "LIPASE", "AMYLASE" in the last 168 hours. CBC: Recent Labs  Lab 09/06/22 0944 09/07/22 0644 09/08/22 0244 09/09/22 0307 09/10/22 0807 09/11/22 0703 09/12/22 0601  WBC 26.7*   < > 20.5* 18.0* 17.0* 17.8* 19.3*  NEUTROABS 25.4*  --   --   --   --   --   --   HGB 8.0*   < > 8.1* 9.0* 8.3* 8.3* 7.0*  HCT 24.9*   < > 24.6* 27.6* 25.3* 26.5* 22.1*  MCV 85.9   < > 83.7 86.8 85.5 86.9 88.0  PLT 495*   < > 402* 408* 400 401* 393   < > = values in this interval not displayed.   Blood Culture    Component Value Date/Time   SDES BLOOD LEFT ARM 09/07/2022 0644   SPECREQUEST  09/07/2022 0644    BOTTLES DRAWN  AEROBIC AND ANAEROBIC Blood Culture adequate volume   CULT GRAM NEGATIVE RODS 09/07/2022 0644   REPTSTATUS PENDING 09/07/2022 0644    Cardiac Enzymes: No results for input(s): "CKTOTAL", "CKMB", "CKMBINDEX", "TROPONINI" in the last 168 hours. CBG: Recent Labs  Lab 09/11/22 0620 09/11/22 1258 09/11/22 1735 09/11/22 2144 09/12/22 0607  GLUCAP 158* 139* 113* 101* 98   Iron Studies:  No results for input(s): "IRON", "TIBC", "TRANSFERRIN", "FERRITIN" in the last 72 hours.  @lablastinr3 @ Studies/Results: DG Chest 2 View  Result Date: 09/11/2022 CLINICAL DATA:  Dyspnea. EXAM: CHEST - 2 VIEW COMPARISON:  September 06, 2022 FINDINGS: Normal cardiac silhouette. Calcific atherosclerotic disease and tortuosity of the aorta. Interstitial pulmonary edema.  Bilateral small pleural effusions. IMPRESSION: Interstitial pulmonary edema with bilateral small pleural effusions. Electronically Signed   By: Ted [name] [name] M.D.   On: 09/11/2022 19:54   Medications:  meropenem (MERREM) IV 1 g (09/12/22 9604)    amiodarone  200 mg Oral Daily   apixaban  2.5 mg Oral  BID   atorvastatin  40 mg Oral Daily   ezetimibe  10 mg Oral Daily   hydrocortisone cream   Topical TID   insulin aspart  0-15 Units Subcutaneous TID WC   insulin aspart  0-5 Units Subcutaneous QHS   insulin aspart protamine- aspart  14 Units Subcutaneous BID WC   metoprolol succinate  25 mg Oral Daily   polyethylene glycol  17 g Oral Daily   sodium bicarbonate  650 mg Oral BID   witch hazel-glycerin   Topical TID    Assessment/PlanVirginia E [name] is an 82 y.o. female atrial fibrillation, DM type 2, HFrEF, HTN, HL,  currently admitted for ESBL E coli UTI and CVA for whom nephrology is consulted for evaluation and management of AKI on CKD.    **AKI on CKD:  baseline Cr was in mid 2s > 08/2022 admission 2.6 > 3.6 > 3 now presenting with Cr 4.55 > 4.6 > 4.7 > 4.8 > 4.8 today.  No nephrology care outpt.  UA consistent with UTI.  Imaging with  no new findings - do not suspect mild hydro on L significantly contributing.  Modest hypotension noted - suspect ATN related to acute infection + hypovolemia.  S/p IV fluid challenge and kidney function continues to worsen and now endorsing uremic symptoms.  Will initiate temporary dialysis - hopefully can get a catheter in her this weekend but with eliquis may need to be Monday - defer to IR consult.  Given fairly advanced CKD at baseline will request tunneled line.   D/w husband who is agreeable to dialysis if needed.   Avoid hypotension and nephrotoxins.    **acute CVA: post central gyrus, neurology following.  PT/OT have been consulted.    **Hyponatremia:  hypovolemic initially+ AKI with free water handling impaired; improved  **Hyperkalemia: lokelma today   **Anemia, normocytic:  Baseline in the past few months 8-9s.  Currently 7-8s.  Denies overt bleeding.   Iron sat 11% - giving IV iron while admitted, other nutritional deficiencies being screened.  Start ESA 7/13.   **HFrEF:  EF 35% 08/2022.  No IVF or diuretics needed today. Meds limited by GFR.     **UTI: ESBL Klebsiella, sens only to imipenem. On meropenem.   **R renal hypoechoic structure:  rads rec 29mo f/u US ensure stable   **DM: insulin per primary.    **A fib:  on eliquis, BB, amiodarone.  Hold eliquis 2.5 BID starting 7/13 AM in prep for line.    Will follow, call with concerns.   Estill Bakes MD 09/12/2022, 7:51 AM  [facility] Pager: [phone]

## 2022-09-12 NOTE — Progress Notes (Signed)
Physical Therapy Treatment Patient Details Name: [name] [name] MRN: [id number] DOB: 1940-06-25 Today's Date: 09/12/2022   History of Present Illness Pt is an 82 y/o female who presents s/p syncope and fall while walking to the car as family was attempting to bring her to the ED for vomiting, weakness. Of note, pt with recent admission with d/c 07/10/22 from [facility] for UTI and hematura with elevated INR (discharged with foley). PMH significant for A fib, L eye blindness, DM II, HTN, osteopenia, pelvic fracture 2018, seizures, systolic CHF, ankle fx surgery 2017, IM nail R in 2014 and L 2017, I&D hip 2017, B shoulder surgery.    PT Comments  Pt pleasant and agreeable this session.  Performed bed mobility and sit to stand with good progress and requiring min assistance.  She refused further gt distance and required increased time and assistance for last two transfers.  After final transfer she returned to seated surface and while PTA was removing gt belt she threw her self posterior into trunk and next extension with eyes rolled and would not respond verbally to PTA.  RN called to bed side and she appeared more herself.  Unclear if this episode was behavioral.  Pt returned back to bed and rolled to side to clean patient up of stool incontinence.  Of note stool is black.       Assistance Recommended at Discharge Frequent or constant Supervision/Assistance  If plan is discharge home, recommend the following:  Can travel by private vehicle    A lot of help with bathing/dressing/bathroom;Assistance with cooking/housework;Direct supervision/assist for medications management;Direct supervision/assist for financial management;Help with stairs or ramp for entrance;Assist for transportation;A lot of help with walking and/or transfers   Yes  Equipment Recommendations       Recommendations for Other Services       Precautions / Restrictions Precautions Precautions: Fall Precaution Comments: L eye  blindness Restrictions Weight Bearing Restrictions: No     Mobility  Bed Mobility Overal bed mobility: Needs Assistance Bed Mobility: Supine to Sit, Sit to Supine     Supine to sit: Min assist, HOB elevated Sit to supine: Mod assist   General bed mobility comments: Assistance to move to edge of bed.  After episode she required mod assistance to move back to bed and lift B LEs against gravity.    Transfers Overall transfer level: Needs assistance Equipment used: Rolling walker (2 wheels) Transfers: Sit to/from Stand Sit to Stand: Min assist, Mod assist           General transfer comment: cues for hand placement and min assist ot power up and manage RW.  During additional trial she required increased time and effort to rise into standing.  Performed x 3 total.    Ambulation/Gait Ambulation/Gait assistance: Mod assist Gait Distance (Feet): 2 Feet (2 steps + 4 steps with seated rest break inbetween.) Assistive device: Rolling walker (2 wheels) Gait Pattern/deviations: Step-through pattern, Decreased stride length, Shuffle, Trunk flexed Gait velocity: dec     General Gait Details: Pt declined further distance this session, she was only able to progress side steps this session.   Stairs             Wheelchair Mobility     Tilt Bed    Modified Rankin (Stroke Patients Only) Modified Rankin (Stroke Patients Only) Pre-Morbid Rankin Score: Moderately severe disability Modified Rankin: Severe disability     Balance Overall balance assessment: Needs assistance Sitting-balance support: Feet supported, No upper extremity supported Sitting  balance-Leahy Scale: Fair       Standing balance-Leahy Scale: Poor                              Cognition Arousal/Alertness: Awake/alert Behavior During Therapy: WFL for tasks assessed/performed Overall Cognitive Status: No family/caregiver present to determine baseline cognitive functioning                                  General Comments: lethargic initially but alert during session.  Had episode during session where she extended trunk back slowly witrh eyes rolling and was not verbally responsive she came around to her normal self with increased time.  Unclear if this was behavioral?  Informed nursing of episode.        Exercises      General Comments        Pertinent Vitals/Pain Pain Assessment Pain Assessment: 0-10 Faces Pain Scale: Hurts even more Pain Location: bottom (hemorrhoids) Pain Descriptors / Indicators: Grimacing, Guarding, Sore, Discomfort Pain Intervention(s): Monitored during session, Repositioned    Home Living                          Prior Function            PT Goals (current goals can now be found in the care plan section) Acute Rehab PT Goals Patient Stated Goal: return home Potential to Achieve Goals: Fair Progress towards PT goals: Progressing toward goals    Frequency    Min 3X/week      PT Plan Current plan remains appropriate    Co-evaluation              AM-PAC PT "6 Clicks" Mobility   Outcome Measure  Help needed turning from your back to your side while in a flat bed without using bedrails?: A Little Help needed moving from lying on your back to sitting on the side of a flat bed without using bedrails?: A Little Help needed moving to and from a bed to a chair (including a wheelchair)?: A Little Help needed standing up from a chair using your arms (e.g., wheelchair or bedside chair)?: A Little Help needed to walk in hospital room?: A Lot Help needed climbing 3-5 steps with a railing? : Total 6 Click Score: 15    End of Session Equipment Utilized During Treatment: Gait belt Activity Tolerance: Patient limited by pain Patient left: with call bell/phone within reach;in bed (RN and NT present at this time to clean patient up post BM.) Nurse Communication: Mobility status;Patient requests pain meds PT  Visit Diagnosis: Unsteadiness on feet (R26.81);Muscle weakness (generalized) (M62.81);Difficulty in walking, not elsewhere classified (R26.2);Pain;Other abnormalities of gait and mobility (R26.89) Pain - part of body:  (buttocks)     Time: 4098-1191 PT Time Calculation (min) (ACUTE ONLY): 22 min  Charges:    $Therapeutic Activity: 8-22 mins PT General Charges $$ ACUTE PT VISIT: 1 Visit                     Bonney Leitz , PTA Acute Rehabilitation Services Office [phone]    Florestine Avers 09/12/2022, 4:09 PM

## 2022-09-12 NOTE — Consult Note (Signed)
Chief Complaint: Patient was seen in consultation today for  Chief Complaint  Patient presents with   Weakness   Dizziness   Referring Physician(s): Dr. Glenna Fellows  Supervising Physician: Irish Lack  Patient Status: [facility] - In-pt  History of Present Illness: [name] [name] is a 82 y.o. female with a medical history significant for atrial fibrillation on Eliquis, DM2 and heart failure. She was admitted 09/06/22 for ESBL e.coli UTI and CVA. Nephrology was consulted for AKI on CKD. Her kidney function has continued to worsen since admission and the nephrology team is preparing the patient for hemodialysis.   Interventional Radiology has been asked to evaluate this patient for an image-guided tunneled dialysis catheter placement.   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   HYPERLIPIDEMIA 03/27/2006   HYPERTENSION 03/27/2006   Intertrochanteric fracture of right hip (HCC) 07/13/2012   LUNG NODULE 09/01/2006   Excision, Bx Benighn   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"   Osteopenia 2004   Dexa 2004 showed Osteopenia, DEXA 03/2007 normal   Osteopenia    Other chronic cystitis with hematuria    Pelvic fracture (HCC) 06/16/2016   S/P fall   Recurrent urinary tract infection    Seeing Urology   RETINOPATHY, BACKGROUND NOS 03/27/2006   Seizures (HCC)    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    Past Surgical History:  Procedure Laterality Date   ANKLE FRACTURE SURGERY Bilateral 08/2015   "right one was in 2 places"   CARDIAC  CATHETERIZATION N/A 05/17/2015   Procedure: Left Heart Cath and Coronary Angiography;  Surgeon: Corky Crafts, MD;  Location: [name] INVASIVE CV LAB;  Service: Cardiovascular;  Laterality: N/A;   CARDIAC CATHETERIZATION N/A 05/17/2015   Procedure: Coronary Balloon Angioplasty;  Surgeon: Corky Crafts, MD;  Location: [facility] INVASIVE CV LAB;  Service: Cardiovascular;  Laterality: N/A;   CARDIOVERSION N/A 08/05/2022   Procedure: CARDIOVERSION;  Surgeon: Chilton Si, MD;  Location: [facility] INVASIVE CV LAB;  Service: Cardiovascular;  Laterality: N/A;   FEMUR IM NAIL Right 07/15/2012   Procedure: INTRAMEDULLARY (IM) NAIL HIP;  Surgeon: Eulas Post, MD;  Location: [facility] OR;  Service: Orthopedics;  Laterality: Right;   FEMUR IM NAIL Left 12/02/2015   Procedure: INTRAMEDULLARY (IM) NAIL FEMORAL;  Surgeon: Eldred Manges, MD;  Location: [facility] OR;  Service: Orthopedics;  Laterality: Left;   FRACTURE SURGERY     INCISION AND DRAINAGE HIP Left 01/03/2016   Procedure: IRRIGATION AND DEBRIDEMENT HIP;  Surgeon: Eldred Manges, MD;  Location: [facility] OR;  Service: Orthopedics;  Laterality: Left;   SHOULDER SURGERY Bilateral    "don't remember which side"    Allergies: Procaine hcl and Amoxicillin  Medications: Prior to Admission medications   Medication Sig Start Date End Date Taking? Authorizing Provider  alendronate (FOSAMAX) 35 MG tablet Take 35 mg by mouth every 7 (seven) days. Take with a full glass of water on an empty stomach.   Yes [provider]  amiodarone (PACERONE) 200 MG tablet Take 1 tablet (200 mg total) by mouth daily. 08/13/22  Yes Zannie Cove, MD  apixaban (ELIQUIS) 2.5 MG TABS tablet Take 1 tablet (2.5 mg total) by mouth 2 (two) times daily. To start likely on Friday 6/14 if recommended by Coumadin clinic after labs done 08/21/22  Yes Chilton Si, MD  atorvastatin (LIPITOR) 80 MG tablet Take 1 tablet (80 mg total) by mouth daily. 08/13/22  Yes [name], Nolon Rod, MD  ezetimibe (ZETIA) 10 MG  tablet Take 1 tablet (10 mg total) by mouth daily. 12/04/21  Yes Chilton Si, MD  hydrocortisone 2.5 % cream Apply topically 2 (two) times daily. 08/31/22  Yes Wendling, Jilda Roche, DO  insulin NPH-regular Human (NOVOLIN 70/30 RELION) (70-30) 100 UNIT/ML injection 10 units in the morning and 5 units at night. Patient taking differently: Inject 5-23 Units into the skin daily. 07/23/22  Yes Dahal, Melina Schools, MD  metoprolol succinate (TOPROL-XL) 50 MG 24 hr tablet TAKE 1 TABLET BY MOUTH ONCE DAILY . APPOINTMENT REQUIRED FOR FUTURE REFILLS Patient taking differently: Take 50 mg by mouth daily. 06/15/22  Yes Chilton Si, MD  Multiple Vitamins-Minerals (MULTIVITAMIN ADULT PO) Take 1 tablet by mouth daily with breakfast.    Yes [provider]  VITAMIN D, CHOLECALCIFEROL, PO Take 1 tablet by mouth daily.   Yes [provider]  furosemide (LASIX) 40 MG tablet Take 1 tablet (40 mg total) by mouth daily as needed for fluid or edema. Patient not taking: Reported on 09/06/2022 07/23/22   Lorin Glass, MD  Nitroglycerin 0.4 % OINT Apply 1 ribbon of ointment anally every 12 hours for anal pain. Patient not taking: Reported on 09/06/2022 08/31/22   Sharlene Dory, DO     Family History  Problem Relation Age of Onset   Diabetes Father    Heart attack Father 54   Diabetes Sister    Drug abuse Son    Cancer Neg Hx        no hx of colon or breast cancer    Social History   Socioeconomic History   Marital status: Married    Spouse name: [name]   Number of children: 1   Years of education: College   Highest education level: Not on file  Occupational History   Occupation: retired     Associate Professor: RETIRED  Tobacco Use   Smoking status: Former    Current packs/day: 0.00    Types: Cigarettes    Quit date: 03/02/1978    Years since quitting: 44.5   Smokeless tobacco: Never  Vaping Use   Vaping status: Never Used  Substance and Sexual Activity   Alcohol use: No     Alcohol/week: 0.0 standard drinks of alcohol   Drug use: No   Sexual activity: Not on file  Other Topics Concern   Not on file  Social History Narrative   Patient lives at home spouse. Uses a walker consistently.   Moved from [place] 2004   1 son, problems w/ drugs, passed away 03/14/2016-- OD       Social Determinants of Health   Financial Resource Strain: Low Risk  (08/22/2020)   Overall Financial Resource Strain (CARDIA)    Difficulty of Paying Living Expenses: Not hard at all  Food Insecurity: No Food Insecurity (09/06/2022)   Hunger Vital Sign    Worried About Running Out of Food in the Last Year: Never true    Ran Out of Food in the Last Year: Never  true  Transportation Needs: No Transportation Needs (09/06/2022)   PRAPARE - Administrator, Civil Service (Medical): No    Lack of Transportation (Non-Medical): No  Physical Activity: Inactive (08/22/2020)   Exercise Vital Sign    Days of Exercise per Week: 0 days    Minutes of Exercise per Session: 0 min  Stress: No Stress Concern Present (08/22/2020)   [employer] of Occupational Health - Occupational Stress Questionnaire    Feeling of Stress : Not at all  Social Connections: Moderately Integrated (08/22/2020)   Social Connection and Isolation Panel [NHANES]    Frequency of Communication with Friends and Family: More than three times a week    Frequency of Social Gatherings with Friends and Family: More than three times a week    Attends Religious Services: More than 4 times per year    Active Member of [employer] or Organizations: No    Attends Banker Meetings: Never    Marital Status: Married    Review of Systems: A 12 point ROS discussed and pertinent positives are indicated in the HPI above.  All other systems are negative.  Review of Systems  Vital Signs: BP (!) 133/42 (BP Location: Right Arm)   Pulse 72   Temp (!) 97.5 F (36.4 C) (Oral)   Resp 18   Ht 5\' 9"  (1.753 m)   Wt 167 lb 8.8 oz (76 kg)    SpO2 95%   BMI 24.74 kg/m   Physical Exam  Imaging: DG Chest 2 View  Result Date: 09/11/2022 CLINICAL DATA:  Dyspnea. EXAM: CHEST - 2 VIEW COMPARISON:  September 06, 2022 FINDINGS: Normal cardiac silhouette. Calcific atherosclerotic disease and tortuosity of the aorta. Interstitial pulmonary edema.  Bilateral small pleural effusions. IMPRESSION: Interstitial pulmonary edema with bilateral small pleural effusions. Electronically Signed   By: Ted Mcalpine M.D.   On: 09/11/2022 19:54   US RENAL  Result Date: 09/07/2022 CLINICAL DATA:  Chronic kidney disease stage 3 Diabetes Hypertension EXAM: RENAL / URINARY TRACT ULTRASOUND COMPLETE COMPARISON:  CT chest abdomen pelvis 07/20/2022 FINDINGS: Right Kidney: Renal measurements: 9.2 x 3.8 x 6.0 cm = volume: 109 mL. Echogenicity within normal limits. No mass or hydronephrosis visualized. 2.8 cm simple cyst in the upper pole does not require dedicated imaging follow-up. 1.5 cm hypoechoic structure in the lower pole of the right kidney does not appear anechoic. Left Kidney: Renal measurements: 10.8 x 6.0 x 5.3 cm = volume: 179 mL. Echogenicity within normal limits. No mass. Mild left hydronephrosis, similar to prior exam. 3.4 cm simple cyst in the lower pole does not require dedicated imaging follow-up. Bladder: Evaluation limited due to under distension. Other: None. IMPRESSION: 1. Unchanged mild left hydronephrosis. 2. 1.5 cm hypoechoic structure in the lower pole of the right kidney can not be characterized as a simple cyst given its internal echoes. It is more likely to be a mildly complicated cyst with proteinaceous or hemorrhagic debris thin a neoplasm. Recommend follow-up ultrasound in 6 months to again evaluate this finding. Electronically Signed   By: Acquanetta Belling M.D.   On: 09/07/2022 11:52   MR ANGIO NECK W WO CONTRAST  Result Date: 09/06/2022 CLINICAL DATA:  Infarct seen on noncontrast MRI EXAM: MRA NECK WITHOUT AND WITH CONTRAST MRA HEAD WITHOUT  CONTRAST TECHNIQUE: Multiplanar and multiecho pulse sequences of the neck were obtained without and with intravenous contrast. Angiographic images of the neck were obtained using MRA technique without and with intravenous contrast; Angiographic images  of the Circle of Willis were obtained using MRA technique without intravenous contrast. CONTRAST:  7mL GADAVIST GADOBUTROL 1 MMOL/ML IV SOLN COMPARISON:  None Available. FINDINGS: MRA NECK FINDINGS Aortic arch: The imaged aortic arch is normal. The origins of the major branch vessels are patent. The subclavian arteries are patent to the level imaged. Right carotid system: The right common, internal, and external carotid arteries are patent, with mild irregularity at the bulb likely reflecting atherosclerotic disease but no evidence of hemodynamically significant stenosis or occlusion. There is no evidence of dissection or aneurysm. Left carotid system: The left common, internal, and external carotid arteries are patent, with mild irregularity at the bulb likely reflecting atherosclerotic disease but no evidence of hemodynamically significant stenosis or occlusion there is no evidence of dissection or aneurysm Vertebral arteries: The vertebral artery origins are not well assessed. Otherwise, the vertebral arteries are patent with antegrade flow. There is no evidence of hemodynamically significant stenosis or occlusion there is no evidence of dissection or aneurysm. Other: None. MRA HEAD FINDINGS Image quality is degraded by motion artifact at the level of the cavernous ICAs. Anterior circulation: The intracranial ICAs are patent, without significant stenosis or occlusion. The MCAs are patent, without proximal high-grade stenosis or occlusion. The bilateral ACAS are patent, without proximal stenosis or occlusion. There is no definite aneurysm. Posterior circulation: The bilateral V4 segments are patent. The basilar artery is patent. The major cerebellar arteries appear  patent. The bilateral PCAs are patent, without proximal high-grade stenosis or occlusion. A left posterior communicating artery is identified. There is no definite aneurysm. Anatomic variants: None. Other: None. IMPRESSION: Patent vasculature of the head and neck without evidence of hemodynamically significant stenosis, occlusion, or dissection. Electronically Signed   By: Lesia Hausen M.D.   On: 09/06/2022 17:14   MR ANGIO HEAD WO CONTRAST  Result Date: 09/06/2022 CLINICAL DATA:  Infarct seen on noncontrast MRI EXAM: MRA NECK WITHOUT AND WITH CONTRAST MRA HEAD WITHOUT CONTRAST TECHNIQUE: Multiplanar and multiecho pulse sequences of the neck were obtained without and with intravenous contrast. Angiographic images of the neck were obtained using MRA technique without and with intravenous contrast; Angiographic images of the Circle of Willis were obtained using MRA technique without intravenous contrast. CONTRAST:  7mL GADAVIST GADOBUTROL 1 MMOL/ML IV SOLN COMPARISON:  None Available. FINDINGS: MRA NECK FINDINGS Aortic arch: The imaged aortic arch is normal. The origins of the major branch vessels are patent. The subclavian arteries are patent to the level imaged. Right carotid system: The right common, internal, and external carotid arteries are patent, with mild irregularity at the bulb likely reflecting atherosclerotic disease but no evidence of hemodynamically significant stenosis or occlusion. There is no evidence of dissection or aneurysm. Left carotid system: The left common, internal, and external carotid arteries are patent, with mild irregularity at the bulb likely reflecting atherosclerotic disease but no evidence of hemodynamically significant stenosis or occlusion there is no evidence of dissection or aneurysm Vertebral arteries: The vertebral artery origins are not well assessed. Otherwise, the vertebral arteries are patent with antegrade flow. There is no evidence of hemodynamically significant  stenosis or occlusion there is no evidence of dissection or aneurysm. Other: None. MRA HEAD FINDINGS Image quality is degraded by motion artifact at the level of the cavernous ICAs. Anterior circulation: The intracranial ICAs are patent, without significant stenosis or occlusion. The MCAs are patent, without proximal high-grade stenosis or occlusion. The bilateral ACAS are patent, without proximal stenosis or occlusion. There is no definite  aneurysm. Posterior circulation: The bilateral V4 segments are patent. The basilar artery is patent. The major cerebellar arteries appear patent. The bilateral PCAs are patent, without proximal high-grade stenosis or occlusion. A left posterior communicating artery is identified. There is no definite aneurysm. Anatomic variants: None. Other: None. IMPRESSION: Patent vasculature of the head and neck without evidence of hemodynamically significant stenosis, occlusion, or dissection. Electronically Signed   By: Lesia Hausen M.D.   On: 09/06/2022 17:14   MR BRAIN WO CONTRAST  Result Date: 09/06/2022 CLINICAL DATA:  Neuro deficit with acute stroke suspected EXAM: MRI HEAD WITHOUT CONTRAST TECHNIQUE: Multiplanar, multiecho pulse sequences of the brain and surrounding structures were obtained without intravenous contrast. 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 Small acute infarct in the postcentral gyrus on the right chronic small vessel ischemia to a moderate degree. No acute hemorrhage, hydrocephalus, or masslike finding Vascular: Normal flow voids. Skull and upper cervical spine: Normal marrow signal. Sinuses/Orbits: Negative. IMPRESSION: 1. 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 2. 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  Electronically Signed   By: Tiburcio Pea M.D.   On: 09/06/2022 11:40   DG Chest Portable 1 View  Result Date: 09/06/2022 CLINICAL DATA:  Pain EXAM: PORTABLE CHEST 1 VIEW COMPARISON:  08/07/2022 FINDINGS: Stable cardiomediastinal contours. Aortic atherosclerosis. Perihilar and bibasilar interstitial opacities. No pleural effusion or pneumothorax. IMPRESSION: Perihilar and bibasilar interstitial opacities, which may reflect edema or atypical infection. Electronically Signed   By: Duanne Guess D.O.   On: 09/06/2022 10:14    Labs:  CBC: Recent Labs    09/09/22 0307 09/10/22 0807 09/11/22 0703 09/12/22 0601  WBC 18.0* 17.0* 17.8* 19.3*  HGB 9.0* 8.3* 8.3* 7.0*  HCT 27.6* 25.3* 26.5* 22.1*  PLT 408* 400 401* 393    COAGS: Recent Labs    08/11/22 0049 08/12/22 0024 08/14/22 1138 09/06/22 1001  INR 4.9* 3.9* 3.1* 2.0*    BMP: Recent Labs    09/09/22 0307 09/10/22 0807 09/11/22 0703 09/12/22 0601  NA 124* 125* 125* 129*  K 5.0 4.7 5.3* 5.3*  CL 93* 97* 97* 98  CO2 20* 21* 18* 19*  GLUCOSE 292* 149* 158* 98  BUN 80* 83* 89* 102*  CALCIUM 7.8* 7.6* 7.8* 8.1*  CREATININE 4.84* 4.85* 4.86* 4.88*  GFRNONAA 9* 9* 9* 8*    LIVER FUNCTION TESTS: Recent Labs    07/20/22 1011 08/03/22 1655 08/08/22 0933 09/06/22 0944 09/12/22 0601  BILITOT 0.4 0.3 0.7 0.8  --   AST 27 51* 40 20  --   ALT 16 21 20 17   --   ALKPHOS 82 101 94 128*  --   PROT 7.3 7.4 6.4* 6.7  --   ALBUMIN 2.3* 2.1* 1.9* 1.8* <1.5*    TUMOR MARKERS: No results for input(s): "AFPTM", "CEA", "CA199", "CHROMGRNA" in the last 8760 hours.  Assessment and Plan:  Acute kidney injury on Chronic kidney disease; pending hemodialysis: Mikayah E. Ryland, 82 year old female, is tentatively scheduled 09/14/22 for an image-guided tunneled dialysis catheter placement.   Risks and benefits discussed with the patient including, but not limited to bleeding, infection, vascular injury, pneumothorax which may  require chest tube placement, air embolism or even death  All of the patient's questions were answered, patient is agreeable to proceed. She will  be NPO at midnight 09/14/22.   Consent signed and in chart.  Thank you for this interesting consult.  I greatly enjoyed meeting [place] E Guity and look forward to participating in their care.  A copy of this report was sent to the requesting provider on this date.  Electronically Signed: Alwyn Ren, AGACNP-BC [phone] 09/12/2022, 1:28 PM   I spent a total of 20 Minutes    in face to face in clinical consultation, greater than 50% of which was counseling/coordinating care for tunneled dialysis catheter placement.

## 2022-09-12 NOTE — Plan of Care (Signed)
  Problem: Coping: Goal: Ability to adjust to condition or change in health will improve Outcome: Progressing   Problem: Fluid Volume: Goal: Ability to maintain a balanced intake and output will improve Outcome: Progressing   Problem: Tissue Perfusion: Goal: Adequacy of tissue perfusion will improve Outcome: Progressing   Problem: Education: Goal: Knowledge of disease or condition will improve Outcome: Progressing   Problem: Ischemic Stroke/TIA Tissue Perfusion: Goal: Complications of ischemic stroke/TIA will be minimized Outcome: Progressing   Problem: Coping: Goal: Will verbalize positive feelings about self Outcome: Progressing   Problem: Health Behavior/Discharge Planning: Goal: Goals will be collaboratively established with patient/family Outcome: Progressing   Problem: Self-Care: Goal: Ability to communicate needs accurately will improve Outcome: Progressing

## 2022-09-12 NOTE — Plan of Care (Signed)
  Problem: Education: Goal: Ability to describe self-care measures that may prevent or decrease complications (Diabetes Survival Skills Education) will improve Outcome: Progressing Goal: Individualized Educational Video(s) Outcome: Progressing   Problem: Coping: Goal: Ability to adjust to condition or change in health will improve Outcome: Progressing   Problem: Fluid Volume: Goal: Ability to maintain a balanced intake and output will improve Outcome: Progressing   Problem: Health Behavior/Discharge Planning: Goal: Ability to identify and utilize available resources and services will improve Outcome: Progressing Goal: Ability to manage health-related needs will improve Outcome: Progressing   Problem: Metabolic: Goal: Ability to maintain appropriate glucose levels will improve Outcome: Progressing   Problem: Nutritional: Goal: Maintenance of adequate nutrition will improve Outcome: Progressing Goal: Progress toward achieving an optimal weight will improve Outcome: Progressing   Problem: Skin Integrity: Goal: Risk for impaired skin integrity will decrease Outcome: Progressing   Problem: Tissue Perfusion: Goal: Adequacy of tissue perfusion will improve Outcome: Progressing   Problem: Education: Goal: Knowledge of disease or condition will improve Outcome: Progressing Goal: Knowledge of secondary prevention will improve (MUST DOCUMENT ALL) Outcome: Progressing Goal: Knowledge of patient specific risk factors will improve (Mark N/A or DELETE if not current risk factor) Outcome: Progressing   Problem: Ischemic Stroke/TIA Tissue Perfusion: Goal: Complications of ischemic stroke/TIA will be minimized Outcome: Progressing   Problem: Coping: Goal: Will verbalize positive feelings about self Outcome: Progressing Goal: Will identify appropriate support needs Outcome: Progressing   Problem: Health Behavior/Discharge Planning: Goal: Ability to manage health-related needs  will improve Outcome: Progressing Goal: Goals will be collaboratively established with patient/family Outcome: Progressing   Problem: Self-Care: Goal: Ability to participate in self-care as condition permits will improve Outcome: Progressing Goal: Verbalization of feelings and concerns over difficulty with self-care will improve Outcome: Progressing Goal: Ability to communicate needs accurately will improve Outcome: Progressing   Problem: Nutrition: Goal: Risk of aspiration will decrease Outcome: Progressing Goal: Dietary intake will improve Outcome: Progressing   Problem: Education: Goal: Knowledge of General Education information will improve Description: Including pain rating scale, medication(s)/side effects and non-pharmacologic comfort measures Outcome: Progressing   Problem: Health Behavior/Discharge Planning: Goal: Ability to manage health-related needs will improve Outcome: Progressing   Problem: Clinical Measurements: Goal: Ability to maintain clinical measurements within normal limits will improve Outcome: Progressing Goal: Will remain free from infection Outcome: Progressing Goal: Diagnostic test results will improve Outcome: Progressing Goal: Respiratory complications will improve Outcome: Progressing Goal: Cardiovascular complication will be avoided Outcome: Progressing   Problem: Activity: Goal: Risk for activity intolerance will decrease Outcome: Progressing   Problem: Nutrition: Goal: Adequate nutrition will be maintained Outcome: Progressing   Problem: Coping: Goal: Level of anxiety will decrease Outcome: Progressing   Problem: Elimination: Goal: Will not experience complications related to bowel motility Outcome: Progressing Goal: Will not experience complications related to urinary retention Outcome: Progressing   Problem: Pain Managment: Goal: General experience of comfort will improve Outcome: Progressing   Problem: Safety: Goal:  Ability to remain free from injury will improve Outcome: Progressing   Problem: Skin Integrity: Goal: Risk for impaired skin integrity will decrease Outcome: Progressing   

## 2022-09-12 NOTE — Progress Notes (Signed)
  Progress Note   Patient: [name] [name] ZOX:[id number] DOB: October 12, 1940 DOA: 09/06/2022     6 DOS: the patient was seen and examined on 09/12/2022 at 8:18 AM      Brief hospital course: [name] is an 82 y.o. F with CKD IV baseline ~1.7-2.2, DM, sCHF EF 35-40%, hx HSV encephalitis 2014, pAF on Eliquis, seizures, hx ESBL UTI who presented with dizziness, found to have stroke, acute renal failure and ESBL bacteremia.      Assessment and Plan: * Acute renal failure superimposed on stage 4 chronic kidney disease (HCC) Urine output low again, creatinine slightly up to 4.88, worsening uremia, unchanged hyperkalemia Had a little bit of hypoxia yesterday, improved today, chest x-ray did show some pulmonary edema - Lokelma per nephrology - Nephrology plan for dialysis once a vascular catheter can be placed on Monday - Defer diuretics to nephrology    Acute CVA (cerebrovascular accident) (HCC) -Continue Lipitor, Zetia, Eliquis, amiodarone  ESBL Bacteremia due to UTI -Continue meropenem, day 7 of 7  Hyponatremia Slightly better    Hyperkalemia - Lokelma per Nephrology  Chronic systolic CHF (congestive heart failure) (HCC) -Holding furosemide due to renal failure - Continue metoprolol - See above regarding pulmonary edema due to renal failure   CAD S/P percutaneous coronary angioplasty -Continue Lipitor, Zetia, metoprolol, Eliquis  Diabetes (HCC) Glucose is better controlled, trending down - Continue NPH, lower dose - Continue sliding scale corrections    Paroxysmal atrial fibrillation (HCC) Rate controlled - Continue amiodarone, Eliquis, metoprolol  Essential hypertension BP normal - Continue metoprolol  Hyperlipidemia - Continue Lipitor and Zetia          Subjective: Patient is more somnolent today, sleepy, has generalized malaise, arthritis, no other focal complaints.  No fever, no dyspnea.     Physical Exam: BP (!) 133/42 (BP Location: Right  Arm)   Pulse 72   Temp (!) 97.5 F (36.4 C) (Oral)   Resp 18   Ht 5\' 9"  (1.753 m)   Wt 76 kg   SpO2 95%   BMI 24.74 kg/m   Elderly adult female, lying in bed, appears weak and tired RRR, no murmurs, no peripheral edema Did not appreciate JVD Respirations seems shallow but normal rate, overall diminished, no rales or wheezes appreciated Abdomen soft without grimace to palpation Attention diminished, affect blunted, psychomotor slowing noted, generalized weaknesses.    Data Reviewed: Discussed with nephrology and interventional radiology Basic metabolic panel shows worsening uremia, renal function, hyperkalemia       Disposition: Status is: Inpatient         Author: Alberteen Sam, MD 09/12/2022 11:20 AM  For on call review [email or web address].

## 2022-09-12 NOTE — Progress Notes (Addendum)
Pharmacy Antibiotic Note  [name] [name] is a 82 y.o. female admitted on 09/06/2022 with UTI.  Pharmacy has been consulted for meropenem dosing for ESBL UTI and bacteremia. Patient is afebrile; WBC elevated.  Plan: Meropenem 1 gm IV q24 for CrCl < 10 ml/min Continue for a total of 7 days (stop date 7/15 added)    Height: 5\' 9"  (175.3 cm) Weight: 76 kg (167 lb 8.8 oz) IBW/kg (Calculated) : 66.2  Temp (24hrs), Avg:97.7 F (36.5 C), Min:97.4 F (36.3 C), Max:98.2 F (36.8 C)  Recent Labs  Lab 09/06/22 1012 09/06/22 1300 09/07/22 0644 09/08/22 0244 09/09/22 0307 09/10/22 0807 09/11/22 0703 09/12/22 0601  WBC  --   --    < > 20.5* 18.0* 17.0* 17.8* 19.3*  CREATININE  --   --    < > 4.70* 4.84* 4.85* 4.86* 4.88*  LATICACIDVEN 1.5 1.8  --   --   --   --   --   --    < > = values in this interval not displayed.    Estimated Creatinine Clearance: 9.6 mL/min (A) (by C-G formula based on SCr of 4.88 mg/dL (H)).    Allergies  Allergen Reactions   Procaine Hcl Anaphylaxis   Amoxicillin Itching    Antimicrobials this admission: 7/8 meropenem>> (stop date of 7/14)   Dose adjustments this admission:  Microbiology results: 7/8 repeat Bcx: NGTD 7/7 UCx:  >100K Kleb pneumo, cx reincubated 7/7 BCx2: Kleb pneumo, gram positive rods (1 bottle) -BCID: Kleb pneumo, ESBL   07/20/22 Cath UCx: 80 K ESBL Kleb pneumo, R to all x imipenem  Thank you for involving pharmacy in the patient's care.   Verdene Rio, PharmD PGY1 Pharmacy Resident

## 2022-09-13 DIAGNOSIS — I639 Cerebral infarction, unspecified: Secondary | ICD-10-CM | POA: Diagnosis not present

## 2022-09-13 DIAGNOSIS — E871 Hypo-osmolality and hyponatremia: Secondary | ICD-10-CM | POA: Diagnosis not present

## 2022-09-13 DIAGNOSIS — R7881 Bacteremia: Secondary | ICD-10-CM | POA: Diagnosis not present

## 2022-09-13 DIAGNOSIS — N179 Acute kidney failure, unspecified: Secondary | ICD-10-CM | POA: Diagnosis not present

## 2022-09-13 LAB — RENAL FUNCTION PANEL
Albumin: <1.5 g/dL — ABNORMAL LOW (ref 3.5–5.0)
Anion gap: 12 (ref 5–15)
BUN: 115 mg/dL — ABNORMAL HIGH (ref 8–23)
CO2: 21 mmol/L — ABNORMAL LOW (ref 22–32)
Calcium: 8.3 mg/dL — ABNORMAL LOW (ref 8.9–10.3)
Chloride: 98 mmol/L (ref 98–111)
Creatinine, Ser: 5.08 mg/dL — ABNORMAL HIGH (ref 0.44–1.00)
GFR, Estimated: 8 mL/min — ABNORMAL LOW (ref >60)
Glucose, Bld: 109 mg/dL — ABNORMAL HIGH (ref 70–99)
Phosphorus: 4.6 mg/dL (ref 2.5–4.6)
Potassium: 5.2 mmol/L — ABNORMAL HIGH (ref 3.5–5.1)
Sodium: 131 mmol/L — ABNORMAL LOW (ref 135–145)

## 2022-09-13 LAB — CBC
HCT: 17.6 % — ABNORMAL LOW (ref 36.0–46.0)
Hemoglobin: 5.8 g/dL — CL (ref 12.0–15.0)
MCH: 28.7 pg (ref 26.0–34.0)
MCHC: 33 g/dL (ref 30.0–36.0)
MCV: 87.1 fL (ref 80.0–100.0)
Platelets: 361 10*3/uL (ref 150–400)
RBC: 2.02 MIL/uL — ABNORMAL LOW (ref 3.87–5.11)
RDW: 18.6 % — ABNORMAL HIGH (ref 11.5–15.5)
WBC: 16 10*3/uL — ABNORMAL HIGH (ref 4.0–10.5)
nRBC: 0.4 % — ABNORMAL HIGH (ref 0.0–0.2)

## 2022-09-13 LAB — GLUCOSE, CAPILLARY
Glucose-Capillary: 125 mg/dL — ABNORMAL HIGH (ref 70–99)
Glucose-Capillary: 134 mg/dL — ABNORMAL HIGH (ref 70–99)
Glucose-Capillary: 134 mg/dL — ABNORMAL HIGH (ref 70–99)
Glucose-Capillary: 130 mg/dL — ABNORMAL HIGH (ref 70–99)

## 2022-09-13 LAB — HEMOGLOBIN AND HEMATOCRIT, BLOOD
HCT: 20 % — ABNORMAL LOW (ref 36.0–46.0)
Hemoglobin: 6.5 g/dL — CL (ref 12.0–15.0)

## 2022-09-13 LAB — CULTURE, BLOOD (ROUTINE X 2)

## 2022-09-13 LAB — PREPARE RBC (CROSSMATCH)

## 2022-09-13 MED ORDER — SODIUM CHLORIDE 0.9% IV SOLUTION: Freq: Once | INTRAVENOUS | Status: AC

## 2022-09-13 MED ORDER — SODIUM ZIRCONIUM CYCLOSILICATE 10 G PO PACK
10.0000 g | PACK | Freq: Once | ORAL | Status: AC
  Administered 2022-09-13: 10 g via ORAL
  Filled 2022-09-13: qty 1

## 2022-09-13 NOTE — Plan of Care (Signed)
  Problem: Education: Goal: Ability to describe self-care measures that may prevent or decrease complications (Diabetes Survival Skills Education) will improve 09/13/2022 1737 by Asencion Gowda, RN Outcome: Progressing 09/13/2022 1736 by Asencion Gowda, RN Outcome: Progressing Goal: Individualized Educational Video(s) Outcome: Progressing   Problem: Coping: Goal: Ability to adjust to condition or change in health will improve Outcome: Progressing   Problem: Fluid Volume: Goal: Ability to maintain a balanced intake and output will improve Outcome: Progressing   Problem: Health Behavior/Discharge Planning: Goal: Ability to identify and utilize available resources and services will improve Outcome: Progressing Goal: Ability to manage health-related needs will improve Outcome: Progressing   Problem: Metabolic: Goal: Ability to maintain appropriate glucose levels will improve Outcome: Progressing   Problem: Nutritional: Goal: Maintenance of adequate nutrition will improve Outcome: Progressing Goal: Progress toward achieving an optimal weight will improve Outcome: Progressing   Problem: Skin Integrity: Goal: Risk for impaired skin integrity will decrease Outcome: Progressing   Problem: Tissue Perfusion: Goal: Adequacy of tissue perfusion will improve Outcome: Progressing   Problem: Education: Goal: Knowledge of disease or condition will improve Outcome: Progressing Goal: Knowledge of secondary prevention will improve (MUST DOCUMENT ALL) Outcome: Progressing Goal: Knowledge of patient specific risk factors will improve Loraine Leriche N/A or DELETE if not current risk factor) Outcome: Progressing   Problem: Ischemic Stroke/TIA Tissue Perfusion: Goal: Complications of ischemic stroke/TIA will be minimized Outcome: Progressing   Problem: Coping: Goal: Will verbalize positive feelings about self Outcome: Progressing Goal: Will identify appropriate support needs Outcome:  Progressing   Problem: Health Behavior/Discharge Planning: Goal: Ability to manage health-related needs will improve Outcome: Progressing Goal: Goals will be collaboratively established with patient/family Outcome: Progressing   Problem: Self-Care: Goal: Ability to participate in self-care as condition permits will improve Outcome: Progressing Goal: Verbalization of feelings and concerns over difficulty with self-care will improve Outcome: Progressing Goal: Ability to communicate needs accurately will improve Outcome: Progressing   Problem: Nutrition: Goal: Risk of aspiration will decrease Outcome: Progressing Goal: Dietary intake will improve Outcome: Progressing   Problem: Education: Goal: Knowledge of General Education information will improve Description: Including pain rating scale, medication(s)/side effects and non-pharmacologic comfort measures Outcome: Progressing   Problem: Health Behavior/Discharge Planning: Goal: Ability to manage health-related needs will improve Outcome: Progressing   Problem: Clinical Measurements: Goal: Ability to maintain clinical measurements within normal limits will improve Outcome: Progressing Goal: Will remain free from infection Outcome: Progressing Goal: Diagnostic test results will improve Outcome: Progressing Goal: Respiratory complications will improve Outcome: Progressing Goal: Cardiovascular complication will be avoided Outcome: Progressing   Problem: Activity: Goal: Risk for activity intolerance will decrease Outcome: Progressing   Problem: Nutrition: Goal: Adequate nutrition will be maintained Outcome: Progressing   Problem: Coping: Goal: Level of anxiety will decrease Outcome: Progressing   Problem: Elimination: Goal: Will not experience complications related to bowel motility Outcome: Progressing Goal: Will not experience complications related to urinary retention Outcome: Progressing   Problem: Pain  Managment: Goal: General experience of comfort will improve Outcome: Progressing   Problem: Safety: Goal: Ability to remain free from injury will improve Outcome: Progressing   Problem: Skin Integrity: Goal: Risk for impaired skin integrity will decrease Outcome: Progressing

## 2022-09-13 NOTE — Progress Notes (Signed)
[facility] Progress Note   Subjective:   Hd 5.8 this AM and report of dark stool.  1u pRBC running.  Pt endorsing poor appetite and lethargy.  No family present.  Eliquis on hold now.  UOP yesterday.  Objective Vitals:   09/13/22 0500 09/13/22 0805 09/13/22 0825 09/13/22 1045  BP:  (!) 118/42 (!) 119/34 (!) 139/45  Pulse:  73 71 69  Resp:  17 16 16   Temp:  (!) 97.5 F (36.4 C) (!) 97.4 F (36.3 C) (!) 97.5 F (36.4 C)  TempSrc:  Oral Oral Oral  SpO2:  97% 98%   Weight: 84.1 kg     Height:       Physical Exam Gen: sleepy, no distress Eyes: anicteric, EOMI ENT: MMM CV:  RRR, no rub  Abd: soft, nontender Back:CTAB GU: purewick with yellow urine in cannister  Extr: 1+ LE edema Neuro: arousable but sleepy, answering questions Skin: no rashes noted  Additional Objective Labs: Basic Metabolic Panel: Recent Labs  Lab 09/11/22 0703 09/12/22 0601 09/13/22 0341  NA 125* 129* 131*  K 5.3* 5.3* 5.2*  CL 97* 98 98  CO2 18* 19* 21*  GLUCOSE 158* 98 109*  BUN 89* 102* 115*  CREATININE 4.86* 4.88* 5.08*  CALCIUM 7.8* 8.1* 8.3*  PHOS  --  4.1 4.6   Liver Function Tests: Recent Labs  Lab 09/12/22 0601 09/13/22 0341  ALBUMIN <1.5* <1.5*   No results for input(s): "LIPASE", "AMYLASE" in the last 168 hours. CBC: Recent Labs  Lab 09/09/22 0307 09/10/22 0807 09/11/22 0703 09/12/22 0601 09/13/22 0341  WBC 18.0* 17.0* 17.8* 19.3* 16.0*  HGB 9.0* 8.3* 8.3* 7.0* 5.8*  HCT 27.6* 25.3* 26.5* 22.1* 17.6*  MCV 86.8 85.5 86.9 88.0 87.1  PLT 408* 400 401* 393 361   Blood Culture    Component Value Date/Time   SDES BLOOD RIGHT HAND 09/12/2022 1434   SDES BLOOD LEFT HAND 09/12/2022 1434   SPECREQUEST  09/12/2022 1434    BOTTLES DRAWN AEROBIC AND ANAEROBIC Blood Culture adequate volume   SPECREQUEST  09/12/2022 1434    BOTTLES DRAWN AEROBIC AND ANAEROBIC Blood Culture results may not be optimal due to an excessive volume of blood received in culture  bottles   CULT  09/12/2022 1434    NO GROWTH < 24 HOURS Performed at [facility], [address], [place], [place] [address]    CULT  09/12/2022 1434    NO GROWTH < 24 HOURS Performed at [facility], [address], [place], [place] [address]    REPTSTATUS PENDING 09/12/2022 1434   REPTSTATUS PENDING 09/12/2022 1434    Cardiac Enzymes: No results for input(s): "CKTOTAL", "CKMB", "CKMBINDEX", "TROPONINI" in the last 168 hours. CBG: Recent Labs  Lab 09/12/22 0607 09/12/22 1312 09/12/22 1551 09/12/22 2121 09/13/22 0637  GLUCAP 98 115* 139* 93 125*   Iron Studies:  No results for input(s): "IRON", "TIBC", "TRANSFERRIN", "FERRITIN" in the last 72 hours.  @lablastinr3 @ Studies/Results: DG Chest 2 View  Result Date: 09/11/2022 CLINICAL DATA:  Dyspnea. EXAM: CHEST - 2 VIEW COMPARISON:  September 06, 2022 FINDINGS: Normal cardiac silhouette. Calcific atherosclerotic disease and tortuosity of the aorta. Interstitial pulmonary edema.  Bilateral small pleural effusions. IMPRESSION: Interstitial pulmonary edema with bilateral small pleural effusions. Electronically Signed   By: Ted Mcalpine M.D.   On: 09/11/2022 19:54   Medications:  anticoagulant sodium citrate     meropenem (MERREM) IV 1 g (09/13/22 0705)  amiodarone  200 mg Oral Daily   atorvastatin  40 mg Oral Daily   Chlorhexidine Gluconate Cloth  6 each Topical Q0600   darbepoetin (ARANESP) injection - NON-DIALYSIS  60 mcg Subcutaneous Q Sat-1800   ezetimibe  10 mg Oral Daily   hydrocortisone cream   Topical TID   insulin aspart  0-15 Units Subcutaneous TID WC   insulin aspart  0-5 Units Subcutaneous QHS   insulin aspart protamine- aspart  10 Units Subcutaneous BID WC   metoprolol succinate  25 mg Oral Daily   polyethylene glycol  17 g Oral Daily   sodium bicarbonate  650 mg Oral BID   witch hazel-glycerin   Topical TID    Assessment/PlanVirginia E [name] is an 82 y.o. female atrial fibrillation, DM type  2, HFrEF, HTN, HL,  currently admitted for ESBL E coli UTI and CVA for whom nephrology is consulted for evaluation and management of AKI on CKD.    **AKI on CKD:  baseline Cr was in mid 2s > 08/2022 admission 2.6 > 3.6 > 3 now presenting with Cr 4.55 > 4.6 > 4.7 > 4.8 > 4.8 today.  No nephrology care outpt.  UA consistent with UTI.  Imaging with no new findings - do not suspect mild hydro on L significantly contributing.  Modest hypotension noted - suspect ATN related to acute infection + hypovolemia.  S/p IV fluid challenge and kidney function continues to worsen and now endorsing uremic symptoms.  IR consulted - plan HD line Monday - requested Tunneled line but with leukocytosis they may do temp. HD #1 Mon.   D/w husband who is agreeable to dialysis if needed.   Avoid hypotension and nephrotoxins.    **acute CVA: post central gyrus, neurology following.  PT/OT have been consulted.    **Hyponatremia:  mild, monitoring  **Hyperkalemia: lokelma today   **Anemia, normocytic:  Baseline in the past few months 8-9s.  Dropped this AM from 7-8s to 5.8 with report of dark stools. Iron sat 11% - giving IV.[name]  Started ESA 7/13.  Transfusion per primary.    **HFrEF:  EF 35% 08/2022.  No IVF or diuretics needed today --> ok to give lasix if needs after blood transfusion. Meds limited by GFR.     **UTI: ESBL Klebsiella, sens only to imipenem. On meropenem.   **R renal hypoechoic structure:  rads rec 92mo f/u US ensure stable   **DM: insulin per primary.    **A fib:  on eliquis, BB, amiodarone.  Holding eliquis in light of poss GIB   Will follow, call with concerns.   Estill Bakes MD 09/13/2022, 11:03 AM  [facility] Pager: [phone]

## 2022-09-13 NOTE — Progress Notes (Signed)
TRH night cross cover note:   I was notified by RN of patient's Hgb of 5.8 this morning, relative to 7.0 yesterday morning, and 8 point 3 in the morning prior to that.  No report of any associated acute symptoms this morning, and vital signs appear stable with systolic blood pressures in the 130s and heart rates in the 60s.  I have subsequently placed orders to transfuse 1 unit PRBC over 3 hours, with order for recheck of hemoglobin following completion of transfusion of this 1 unit PRBC.     Newton Pigg, DO Hospitalist

## 2022-09-13 NOTE — Plan of Care (Signed)
  Problem: Education: Goal: Ability to describe self-care measures that may prevent or decrease complications (Diabetes Survival Skills Education) will improve Outcome: Progressing   

## 2022-09-13 NOTE — Plan of Care (Signed)
Patient remains in Neuro PCU at time of writing. Patient is scheduled for tunneled line placement and possible HD tomorrow, 09/14/22. Patient remains AA+Ox4 with no focal neuro deficits. NIHSS is ordered to be assessed q 4 hours, and is currently scored as 0. Plan for NPO after midnight tonight.   Problem: Education: Goal: Ability to describe self-care measures that may prevent or decrease complications (Diabetes Survival Skills Education) will improve Outcome: Progressing Goal: Individualized Educational Video(s) Outcome: Progressing   Problem: Coping: Goal: Ability to adjust to condition or change in health will improve Outcome: Progressing   Problem: Fluid Volume: Goal: Ability to maintain a balanced intake and output will improve Outcome: Progressing   Problem: Health Behavior/Discharge Planning: Goal: Ability to identify and utilize available resources and services will improve Outcome: Progressing Goal: Ability to manage health-related needs will improve Outcome: Progressing   Problem: Metabolic: Goal: Ability to maintain appropriate glucose levels will improve Outcome: Progressing   Problem: Nutritional: Goal: Maintenance of adequate nutrition will improve Outcome: Progressing Goal: Progress toward achieving an optimal weight will improve Outcome: Progressing   Problem: Skin Integrity: Goal: Risk for impaired skin integrity will decrease Outcome: Progressing   Problem: Tissue Perfusion: Goal: Adequacy of tissue perfusion will improve Outcome: Progressing   Problem: Education: Goal: Knowledge of disease or condition will improve Outcome: Progressing Goal: Knowledge of secondary prevention will improve (MUST DOCUMENT ALL) Outcome: Progressing Goal: Knowledge of patient specific risk factors will improve Loraine Leriche N/A or DELETE if not current risk factor) Outcome: Progressing   Problem: Ischemic Stroke/TIA Tissue Perfusion: Goal: Complications of ischemic stroke/TIA  will be minimized Outcome: Progressing   Problem: Coping: Goal: Will verbalize positive feelings about self Outcome: Progressing Goal: Will identify appropriate support needs Outcome: Progressing   Problem: Health Behavior/Discharge Planning: Goal: Ability to manage health-related needs will improve Outcome: Progressing Goal: Goals will be collaboratively established with patient/family Outcome: Progressing   Problem: Self-Care: Goal: Ability to participate in self-care as condition permits will improve Outcome: Progressing Goal: Verbalization of feelings and concerns over difficulty with self-care will improve Outcome: Progressing Goal: Ability to communicate needs accurately will improve Outcome: Progressing   Problem: Nutrition: Goal: Risk of aspiration will decrease Outcome: Progressing Goal: Dietary intake will improve Outcome: Progressing   Problem: Education: Goal: Knowledge of General Education information will improve Description: Including pain rating scale, medication(s)/side effects and non-pharmacologic comfort measures Outcome: Progressing   Problem: Health Behavior/Discharge Planning: Goal: Ability to manage health-related needs will improve Outcome: Progressing   Problem: Clinical Measurements: Goal: Ability to maintain clinical measurements within normal limits will improve Outcome: Progressing Goal: Will remain free from infection Outcome: Progressing Goal: Diagnostic test results will improve Outcome: Progressing Goal: Respiratory complications will improve Outcome: Progressing Goal: Cardiovascular complication will be avoided Outcome: Progressing   Problem: Activity: Goal: Risk for activity intolerance will decrease Outcome: Progressing   Problem: Nutrition: Goal: Adequate nutrition will be maintained Outcome: Progressing   Problem: Coping: Goal: Level of anxiety will decrease Outcome: Progressing   Problem: Elimination: Goal: Will  not experience complications related to bowel motility Outcome: Progressing Goal: Will not experience complications related to urinary retention Outcome: Progressing   Problem: Pain Managment: Goal: General experience of comfort will improve Outcome: Progressing   Problem: Safety: Goal: Ability to remain free from injury will improve Outcome: Progressing   Problem: Skin Integrity: Goal: Risk for impaired skin integrity will decrease Outcome: Progressing

## 2022-09-13 NOTE — Progress Notes (Signed)
  Progress Note   Patient: [name] [name]:[id number] DOB: 1940-06-02 DOA: 09/06/2022     7 DOS: the patient was seen and examined on 09/13/2022 at 9:34 AM      Brief hospital course: Mrs. Yaccarino is an 82 y.o. F with CKD IV baseline ~1.7-2.2, DM, sCHF EF 35-40%, hx HSV encephalitis 2014, pAF on Eliquis, seizures, hx ESBL UTI who presented with dizziness, found to have stroke, acute renal failure and ESBL bacteremia.    Significant events: 7/7: Admitted for stroke, on antibiotics/gentle IVF; Neuro consulted 7/8: Nephrology consulted; BCx growing Klebsiella 7/10: Cr worse, IVF restarted 7/14: Hgb down to 6, transfused 2 units    Significant studies: 7/7: CXR -- perihilar opacities, c/w infection or edema 7/7: MRI brain/MRA H&N -- small R postcentral gyrus infarct, no significant vessel disease  7/8: US renal -- unchanged mild L hydro, 1.5 cm cyst   Significant microbiology data: 7/7 BCx x2: ESBL Klebsiella in 2/2 7/7 UCx: ESBL Klebsiella 7/8 BCx x2: ESBL in 1/3 (although this was drawn in very close proximity to meropenem) 7/13 BCx x2: Pending    Procedures: None    Consults: Neurology Nephrology      Assessment and Plan: * Acute renal failure superimposed on stage 4 chronic kidney disease (HCC) Worsening uremia.  Plan for vascath and HD tomorrow - Consult Nephrology    Acute CVA (cerebrovascular accident) (HCC) - Continue Lipitor, Zetia - Continue ELiquis  Anemia Hgb down to 5.8 today.  There is some hearsay from nursing this morning, that stools were "dark" overnight. - Transfuse 2 units  - Hold Eliquis - Check FOBT   ESBL Bacteremia due to UTI Completed 7 days antibiotics.   - Follow cultures from 7/13, NGTD  Hyponatremia Improved   Hyperkalemia - Lokelma per Nephrology  Chronic systolic CHF (congestive heart failure) (HCC) CAD S/P percutaneous coronary angioplasty - Continue metoprolol  Diabetes (HCC) Glucoses controlled - Continue  NPH - Continue sliding scale corrections   Paroxysmal atrial fibrillation (HCC) Rate controlled - Continue amiodarone, metoprolol - Hold Eliquis   Essential hypertension BP normal - Continue metoprolol  Hyperlipidemia - Continue Lipitor and Zetia          Subjective: She is getting weaker and more tired.  She has some right-sided chest wall pain.  No vomiting, no fever.  Requiring transfusion this morning.     Physical Exam: BP (!) 139/45 (BP Location: Right Arm)   Pulse 69   Temp (!) 97.5 F (36.4 C) (Oral)   Resp 16   Ht 5\' 9"  (1.753 m)   Wt 84.1 kg   SpO2 99%   BMI 27.38 kg/m   Frail, pale, elderly adult female, lying in bed, appears weak and debilitated RRR, no murmurs, no peripheral edema Respiratory rate normal, lungs clear without rales or wheezes Abdomen soft no tenderness palpation There is tenderness to palpation of the right chest wall, in the lower mid axillary line, without skin changes. Attention diminished, affect blunted, judgment insight impaired somnolent, severe generalized weakness    Data Reviewed: Discussed with nephrology Creatinine up to 5.08, potassium no change Hemoglobin down to 5.8, white blood cell count down to 16     Disposition: Status is: Inpatient         Author: Alberteen Sam, MD 09/13/2022 12:41 PM  For on call review [email or web address].

## 2022-09-14 ENCOUNTER — Telehealth: Payer: Self-pay | Admitting: *Deleted

## 2022-09-14 ENCOUNTER — Inpatient Hospital Stay (HOSPITAL_COMMUNITY): Payer: Medicare Other

## 2022-09-14 DIAGNOSIS — N179 Acute kidney failure, unspecified: Secondary | ICD-10-CM | POA: Diagnosis not present

## 2022-09-14 DIAGNOSIS — R7881 Bacteremia: Secondary | ICD-10-CM | POA: Diagnosis not present

## 2022-09-14 DIAGNOSIS — E871 Hypo-osmolality and hyponatremia: Secondary | ICD-10-CM | POA: Diagnosis not present

## 2022-09-14 DIAGNOSIS — I639 Cerebral infarction, unspecified: Secondary | ICD-10-CM | POA: Diagnosis not present

## 2022-09-14 HISTORY — PX: IR FLUORO GUIDE CV LINE RIGHT: IMG2283

## 2022-09-14 HISTORY — PX: IR US GUIDE VASC ACCESS RIGHT: IMG2390

## 2022-09-14 LAB — BLOOD CULTURE ID PANEL (REFLEXED) - BCID2

## 2022-09-14 LAB — RENAL FUNCTION PANEL
Albumin: <1.5 g/dL — ABNORMAL LOW (ref 3.5–5.0)
Anion gap: 8 (ref 5–15)
BUN: 134 mg/dL — ABNORMAL HIGH (ref 8–23)
CO2: 21 mmol/L — ABNORMAL LOW (ref 22–32)
Calcium: 8.1 mg/dL — ABNORMAL LOW (ref 8.9–10.3)
Chloride: 103 mmol/L (ref 98–111)
Creatinine, Ser: 4.89 mg/dL — ABNORMAL HIGH (ref 0.44–1.00)
GFR, Estimated: 8 mL/min — ABNORMAL LOW (ref >60)
Glucose, Bld: 148 mg/dL — ABNORMAL HIGH (ref 70–99)
Phosphorus: 4.5 mg/dL (ref 2.5–4.6)
Potassium: 5.2 mmol/L — ABNORMAL HIGH (ref 3.5–5.1)
Sodium: 132 mmol/L — ABNORMAL LOW (ref 135–145)

## 2022-09-14 LAB — CBC
HCT: 19.4 % — ABNORMAL LOW (ref 36.0–46.0)
Hemoglobin: 6.4 g/dL — CL (ref 12.0–15.0)
MCH: 28.4 pg (ref 26.0–34.0)
MCHC: 33 g/dL (ref 30.0–36.0)
MCV: 86.2 fL (ref 80.0–100.0)
Platelets: 303 10*3/uL (ref 150–400)
RBC: 2.25 MIL/uL — ABNORMAL LOW (ref 3.87–5.11)
RDW: 18.6 % — ABNORMAL HIGH (ref 11.5–15.5)
WBC: 17.5 10*3/uL — ABNORMAL HIGH (ref 4.0–10.5)
nRBC: 1.7 % — ABNORMAL HIGH (ref 0.0–0.2)

## 2022-09-14 LAB — TYPE AND SCREEN
ABO/RH(D): A NEG
Antibody Screen: NEGATIVE
Unit division: 0
Unit division: 0

## 2022-09-14 LAB — GLUCOSE, CAPILLARY
Glucose-Capillary: 132 mg/dL — ABNORMAL HIGH (ref 70–99)
Glucose-Capillary: 148 mg/dL — ABNORMAL HIGH (ref 70–99)
Glucose-Capillary: 142 mg/dL — ABNORMAL HIGH (ref 70–99)

## 2022-09-14 LAB — CULTURE, BLOOD (ROUTINE X 2)

## 2022-09-14 LAB — BPAM RBC
Blood Product Expiration Date: 202408082359
ISSUE DATE / TIME: 202407141256
Unit Type and Rh: 600

## 2022-09-14 LAB — PREPARE RBC (CROSSMATCH)

## 2022-09-14 MED ORDER — HEPARIN SODIUM (PORCINE) 1000 UNIT/ML IJ SOLN
INTRAMUSCULAR | Status: AC
  Filled 2022-09-14: qty 10

## 2022-09-14 MED ORDER — HEPARIN SODIUM (PORCINE) 1000 UNIT/ML IJ SOLN
INTRAMUSCULAR | Status: AC
  Filled 2022-09-14: qty 3

## 2022-09-14 MED ORDER — NEPRO/CARBSTEADY PO LIQD
237.0000 mL | ORAL | Status: DC
  Administered 2022-09-14 – 2022-09-23 (×7): 237 mL via ORAL

## 2022-09-14 MED ORDER — LIDOCAINE-EPINEPHRINE 1 %-1:100000 IJ SOLN
20.0000 mL | Freq: Once | INTRAMUSCULAR | Status: AC
  Administered 2022-09-14: 10 mL

## 2022-09-14 MED ORDER — LIDOCAINE-EPINEPHRINE 1 %-1:100000 IJ SOLN
INTRAMUSCULAR | Status: AC
  Filled 2022-09-14: qty 1

## 2022-09-14 MED ORDER — SODIUM CHLORIDE 0.9% IV SOLUTION: Freq: Once | INTRAVENOUS | Status: AC

## 2022-09-14 MED ORDER — HEPARIN SODIUM (PORCINE) 1000 UNIT/ML IJ SOLN
10.0000 mL | Freq: Once | INTRAMUSCULAR | Status: DC
  Filled 2022-09-14: qty 10

## 2022-09-14 NOTE — Progress Notes (Signed)
  Progress Note   Patient: [name] [name] ZOX:[id number] DOB: 05-20-1940 DOA: 09/06/2022     8 DOS: the patient was seen and examined on 09/14/2022        Brief hospital course: [name] [name] is an 82 y.o. F with CKD IV baseline ~1.7-2.2, DM, sCHF EF 35-40%, hx HSV encephalitis 2014, pAF on Eliquis, seizures, hx ESBL UTI who presented with dizziness, found to have stroke, acute renal failure and ESBL bacteremia.    Significant events: 7/7: Admitted for stroke, on antibiotics/gentle IVF; Neuro consulted 7/8: Nephrology consulted; BCx growing Klebsiella 7/10: Cr worse, IVF restarted 7/14: Hgb down to 6, transfused 2 units 7/15: Vascath placed, for first HD    Significant studies: 7/7: CXR -- perihilar opacities, c/w infection or edema 7/7: MRI brain/MRA H&N -- small R postcentral gyrus infarct, no significant vessel disease  7/8: US renal -- unchanged mild L hydro, 1.5 cm cyst   Significant microbiology data: 7/7 BCx x2: ESBL Klebsiella in 2/2 7/7 UCx: ESBL Klebsiella 7/8 BCx x2: ESBL in 1/3 (although this was drawn in very close proximity to meropenem) 7/13 BCx x2: Pending    Procedures: 7/15: Vascath placed, first HD planned    Consults: Neurology Nephrology      Assessment and Plan: * Acute renal failure superimposed on stage 4 chronic kidney disease (HCC) -Start dialysis today    Acute CVA (cerebrovascular accident) (HCC) -Continue Lipitor, Zetia - Hold Eliquis in setting of worsening anemia  ESBL Bacteremia due to UTI Completed antibiotics -Follow repeat cultures  Hyponatremia Worsening today - Per HD    Hyperkalemia - Hemodialysis  Chronic systolic CHF (congestive heart failure) (HCC) -Continue metoprolol - Dialysis  Anemia in chronic kidney disease (CKD) Anemia worsening.  There was a questionable melena 2 days ago, but no BMs since.  Hgb only drifting down.    - Transfuse again - Transfusion threshold 7 g/dL - Check FOBT - HOld  ELiquis  CAD S/P percutaneous coronary angioplasty - Continue cholesterol medicines, metoprolol - Hold ELiquis  Diabetes (HCC) Glucoses controlled  - Continue NPH - Continue sliding scale corrections    Paroxysmal atrial fibrillation (HCC) Rate controlled - Continue amiodarone, metop - Hold ELiquis           Subjective: Patient is extremely weak and tired, she has had no further bowel movements.  No vomiting.  No fever.     Physical Exam: BP (!) 138/45   Pulse 66   Temp (!) 97 F (36.1 C) (Oral)   Resp 18   Ht 5\' 9"  (1.753 m)   Wt 83.6 kg   SpO2 96%   BMI 27.22 kg/m   Frail pale elderly female, lying in bed, sluggish RRR, no murmurs, mild nonpitting peripheral edema Respiratory rate normal, lungs clear without rales or wheezes Abdomen soft, no voluntary guarding, no rigidity, no focal tenderness Attention diminished, affect blunted, judgment and insight appear impaired, severe generalized weakness    Data Reviewed: Basic metabolic panel shows stable renal function, mild acidosis, mild hyperkalemia, worsening hyponatremia CBC shows hemoglobin less than 7 again, platelets and white count unchanged      Disposition: Status is: Inpatient         Author: Alberteen Sam, MD 09/14/2022 3:29 PM  For on call review [email or web address].

## 2022-09-14 NOTE — Telephone Encounter (Signed)
Left message on machine to call back to schedule for Prolia on or after 09/23/22.  Cost is $0 OOP.

## 2022-09-14 NOTE — Progress Notes (Signed)
PT Cancellation Note  Patient Details Name: [name] [name] MRN: [id number] DOB: 1940/07/27   Cancelled Treatment:    Reason Eval/Treat Not Completed: Patient declined, no reason specified. States IV in neck is really bothering her. Declines therapy at this time. Will re-attempt at later date.    [name] 09/14/2022, 3:01 PM

## 2022-09-14 NOTE — Plan of Care (Signed)
  Problem: Education: Goal: Ability to describe self-care measures that may prevent or decrease complications (Diabetes Survival Skills Education) will improve Outcome: Progressing Goal: Individualized Educational Video(s) Outcome: Progressing   Problem: Coping: Goal: Ability to adjust to condition or change in health will improve Outcome: Progressing   Problem: Fluid Volume: Goal: Ability to maintain a balanced intake and output will improve Outcome: Progressing   Problem: Health Behavior/Discharge Planning: Goal: Ability to identify and utilize available resources and services will improve Outcome: Progressing Goal: Ability to manage health-related needs will improve Outcome: Progressing   Problem: Metabolic: Goal: Ability to maintain appropriate glucose levels will improve Outcome: Progressing   Problem: Nutritional: Goal: Maintenance of adequate nutrition will improve Outcome: Progressing Goal: Progress toward achieving an optimal weight will improve Outcome: Progressing   Problem: Skin Integrity: Goal: Risk for impaired skin integrity will decrease Outcome: Progressing   Problem: Tissue Perfusion: Goal: Adequacy of tissue perfusion will improve Outcome: Progressing   Problem: Education: Goal: Knowledge of disease or condition will improve Outcome: Progressing Goal: Knowledge of secondary prevention will improve (MUST DOCUMENT ALL) Outcome: Progressing Goal: Knowledge of patient specific risk factors will improve (Mark N/A or DELETE if not current risk factor) Outcome: Progressing   Problem: Ischemic Stroke/TIA Tissue Perfusion: Goal: Complications of ischemic stroke/TIA will be minimized Outcome: Progressing   Problem: Coping: Goal: Will verbalize positive feelings about self Outcome: Progressing Goal: Will identify appropriate support needs Outcome: Progressing   Problem: Health Behavior/Discharge Planning: Goal: Ability to manage health-related needs  will improve Outcome: Progressing Goal: Goals will be collaboratively established with patient/family Outcome: Progressing   Problem: Self-Care: Goal: Ability to participate in self-care as condition permits will improve Outcome: Progressing Goal: Verbalization of feelings and concerns over difficulty with self-care will improve Outcome: Progressing Goal: Ability to communicate needs accurately will improve Outcome: Progressing   Problem: Nutrition: Goal: Risk of aspiration will decrease Outcome: Progressing Goal: Dietary intake will improve Outcome: Progressing   Problem: Education: Goal: Knowledge of General Education information will improve Description: Including pain rating scale, medication(s)/side effects and non-pharmacologic comfort measures Outcome: Progressing   Problem: Health Behavior/Discharge Planning: Goal: Ability to manage health-related needs will improve Outcome: Progressing   Problem: Clinical Measurements: Goal: Ability to maintain clinical measurements within normal limits will improve Outcome: Progressing Goal: Will remain free from infection Outcome: Progressing Goal: Diagnostic test results will improve Outcome: Progressing Goal: Respiratory complications will improve Outcome: Progressing Goal: Cardiovascular complication will be avoided Outcome: Progressing   Problem: Activity: Goal: Risk for activity intolerance will decrease Outcome: Progressing   Problem: Nutrition: Goal: Adequate nutrition will be maintained Outcome: Progressing   Problem: Coping: Goal: Level of anxiety will decrease Outcome: Progressing   Problem: Elimination: Goal: Will not experience complications related to bowel motility Outcome: Progressing Goal: Will not experience complications related to urinary retention Outcome: Progressing   Problem: Pain Managment: Goal: General experience of comfort will improve Outcome: Progressing   Problem: Safety: Goal:  Ability to remain free from injury will improve Outcome: Progressing   Problem: Skin Integrity: Goal: Risk for impaired skin integrity will decrease Outcome: Progressing   

## 2022-09-14 NOTE — Transportation (Signed)
Pt. Transported to IR with RN and transporter. Blood still infusion. Handoff completed.

## 2022-09-14 NOTE — Progress Notes (Signed)
PHARMACY - PHYSICIAN COMMUNICATION CRITICAL VALUE ALERT - BLOOD CULTURE IDENTIFICATION (BCID)  [name] [name] is an 82 y.o. female who presented to [facility] on 09/06/2022 with a chief complaint of weakness and dizziness.   Assessment:  Known Klebsiella bacteremia with repeat blood cultures now showing 1 out of 4 with GPC. BCID with methicillin resistant staphylococcus epidermidis.  WBC 17.5 (tending down overall). Afebrile.   Name of physician (or Provider) Contacted: Dr. Maryfrances Bunnell  Current antibiotics: Completed 7 days of Merrem therapy.   Changes to prescribed antibiotics recommended:  Discussed with Dr. Maryfrances Bunnell- treating as contaminant at this time. No further antibiotics for now and observe.   Results for orders placed or performed during the hospital encounter of 09/06/22  Blood Culture ID Panel (Reflexed) (Collected: 09/12/2022  2:34 PM)  Result Value Ref Range   Enterococcus faecalis NOT DETECTED NOT DETECTED   Enterococcus Faecium NOT DETECTED NOT DETECTED   Listeria monocytogenes NOT DETECTED NOT DETECTED   Staphylococcus species DETECTED (A) NOT DETECTED   Staphylococcus aureus (BCID) NOT DETECTED NOT DETECTED   Staphylococcus epidermidis DETECTED (A) NOT DETECTED   Staphylococcus lugdunensis NOT DETECTED NOT DETECTED   Streptococcus species NOT DETECTED NOT DETECTED   Streptococcus agalactiae NOT DETECTED NOT DETECTED   Streptococcus pneumoniae NOT DETECTED NOT DETECTED   Streptococcus pyogenes NOT DETECTED NOT DETECTED   A.calcoaceticus-baumannii NOT DETECTED NOT DETECTED   Bacteroides fragilis NOT DETECTED NOT DETECTED   Enterobacterales NOT DETECTED NOT DETECTED   Enterobacter cloacae complex NOT DETECTED NOT DETECTED   Escherichia coli NOT DETECTED NOT DETECTED   Klebsiella aerogenes NOT DETECTED NOT DETECTED   Klebsiella oxytoca NOT DETECTED NOT DETECTED   Klebsiella pneumoniae NOT DETECTED NOT DETECTED   Proteus species NOT DETECTED NOT DETECTED   Salmonella  species NOT DETECTED NOT DETECTED   Serratia marcescens NOT DETECTED NOT DETECTED   Haemophilus influenzae NOT DETECTED NOT DETECTED   Neisseria meningitidis NOT DETECTED NOT DETECTED   Pseudomonas aeruginosa NOT DETECTED NOT DETECTED   Stenotrophomonas maltophilia NOT DETECTED NOT DETECTED   Candida albicans NOT DETECTED NOT DETECTED   Candida auris NOT DETECTED NOT DETECTED   Candida glabrata NOT DETECTED NOT DETECTED   Candida krusei NOT DETECTED NOT DETECTED   Candida parapsilosis NOT DETECTED NOT DETECTED   Candida tropicalis NOT DETECTED NOT DETECTED   Cryptococcus neoformans/gattii NOT DETECTED NOT DETECTED   Methicillin resistance mecA/C DETECTED (A) NOT DETECTED    [name] 09/14/2022  6:50 PM

## 2022-09-14 NOTE — Procedures (Signed)
  Procedure:  FLuoro/US guided R internal jugular TL HD CVC placement Mahurkur 16 Preprocedure diagnosis: The primary encounter diagnosis was Poorly controlled diabetes mellitus (HCC). Diagnoses of Cerebrovascular accident (CVA), unspecified mechanism (HCC) and Acute cystitis with hematuria were also pertinent to this visit. Postprocedure diagnosis: same EBL:    minimal Complications:   none immediate  See full dictation in [employer].  Thora Lance MD Main # [phone] Pager  [phone] Mobile [phone]

## 2022-09-14 NOTE — Progress Notes (Signed)
[facility] Progress Note   Subjective:    Hb 6.4 this AM, for pRBC UOP 0.7L, SCr 5.1 to 4.9, K 5.2  Objective Vitals:   09/14/22 0400 09/14/22 0851 09/14/22 0945 09/14/22 1015  BP: (!) 138/48 (!) 134/48 (!) 129/51 (!) 127/49  Pulse: 66 71 69 67  Resp: 14 16 17 16   Temp: (!) 97.4 F (36.3 C) 98 F (36.7 C) 97.7 F (36.5 C) (!) 97.4 F (36.3 C)  TempSrc: Axillary Oral Oral Oral  SpO2: 95% 95% 99% 94%  Weight: 83.6 kg     Height:       Physical Exam Gen: sleepy, no distress Eyes: anicteric, EOMI ENT: MMM CV:  RRR, no rub  Abd: soft, nontender Back:CTAB GU: purewick with yellow urine in cannister  Extr: 1+ LE edema Neuro: arousable but sleepy, answering questions Skin: no rashes noted  Additional Objective Labs: Basic Metabolic Panel: Recent Labs  Lab 09/12/22 0601 09/13/22 0341 09/14/22 0728  NA 129* 131* 132*  K 5.3* 5.2* 5.2*  CL 98 98 103  CO2 19* 21* 21*  GLUCOSE 98 109* 148*  BUN 102* 115* 134*  CREATININE 4.88* 5.08* 4.89*  CALCIUM 8.1* 8.3* 8.1*  PHOS 4.1 4.6 4.5   Liver Function Tests: Recent Labs  Lab 09/12/22 0601 09/13/22 0341 09/14/22 0728  ALBUMIN <1.5* <1.5* <1.5*   No results for input(s): "LIPASE", "AMYLASE" in the last 168 hours. CBC: Recent Labs  Lab 09/10/22 0807 09/11/22 0703 09/12/22 0601 09/13/22 0341 09/13/22 1156 09/14/22 0728  WBC 17.0* 17.8* 19.3* 16.0*  --  17.5*  HGB 8.3* 8.3* 7.0* 5.8* 6.5* 6.4*  HCT 25.3* 26.5* 22.1* 17.6* 20.0* 19.4*  MCV 85.5 86.9 88.0 87.1  --  86.2  PLT 400 401* 393 361  --  303   Blood Culture    Component Value Date/Time   SDES BLOOD RIGHT HAND 09/12/2022 1434   SDES BLOOD LEFT HAND 09/12/2022 1434   SPECREQUEST  09/12/2022 1434    BOTTLES DRAWN AEROBIC AND ANAEROBIC Blood Culture adequate volume   SPECREQUEST  09/12/2022 1434    BOTTLES DRAWN AEROBIC AND ANAEROBIC Blood Culture results may not be optimal due to an excessive volume of blood received in culture  bottles   CULT  09/12/2022 1434    NO GROWTH 2 DAYS Performed at [facility], [address] [place], [place] [address]    CULT  09/12/2022 1434    NO GROWTH 2 DAYS Performed at [facility], [address] [place], [place] [address]    REPTSTATUS PENDING 09/12/2022 1434   REPTSTATUS PENDING 09/12/2022 1434    Cardiac Enzymes: No results for input(s): "CKTOTAL", "CKMB", "CKMBINDEX", "TROPONINI" in the last 168 hours. CBG: Recent Labs  Lab 09/13/22 1244 09/13/22 1721 09/13/22 2128 09/14/22 0647 09/14/22 0857  GLUCAP 134* 134* 130* 132* 148*   Iron Studies:  No results for input(s): "IRON", "TIBC", "TRANSFERRIN", "FERRITIN" in the last 72 hours.  @lablastinr3 @ Studies/Results: No results found. Medications:  anticoagulant sodium citrate      amiodarone  200 mg Oral Daily   atorvastatin  40 mg Oral Daily   Chlorhexidine Gluconate Cloth  6 each Topical Q0600   darbepoetin (ARANESP) injection - NON-DIALYSIS  60 mcg Subcutaneous Q Sat-1800   ezetimibe  10 mg Oral Daily   hydrocortisone cream   Topical TID   insulin aspart  0-15 Units Subcutaneous TID WC   insulin aspart  0-5 Units Subcutaneous QHS   insulin  aspart protamine- aspart  10 Units Subcutaneous BID WC   metoprolol succinate  25 mg Oral Daily   polyethylene glycol  17 g Oral Daily   sodium bicarbonate  650 mg Oral BID   witch hazel-glycerin   Topical TID    Assessment/PlanVirginia E [name] is an 82 y.o. female atrial fibrillation, DM type 2, HFrEF, HTN, HL,  currently admitted for ESBL E coli UTI and CVA for whom nephrology is consulted for evaluation and management of AKI on CKD.    **AKI on CKD:  baseline Cr was in mid 2s > 08/2022 admission 2.6 > 3.6 > 3 now presenting with Cr 4.55 > 4.6 > 4.7 > 4.8 > 5.1 > 4.9.  BUN increasing further.  Has uremic symptoms.  No nephrology care outpt.  UA consistent with UTI.  Imaging with no new findings - do not suspect mild hydro on L significantly  contributing.  Modest hypotension noted - suspect ATN related to acute infection + hypovolemia.  S/p IV fluid challenge and kidney function continues to worsen and now endorsing uremic symptoms.  IR consulted - plan HD line today - requested Tunneled line but with leukocytosis they may do temp. HD #1 today.     **acute CVA: post central gyrus, neurology following.  PT/OT have been consulted.    **Hyponatremia:  mild, monitoring; should improve with HD  **Hyperkalemia: HD today, stable mild at 5.2   **Anemia, normocytic:  Baseline in the past few months 8-9s.  Dropped this AM from 7-8s to 5.8 with report of dark stools. Iron sat 11% - s/p  IV. FE.  Started ESA 7/13.  Transfusion per primary.    **HFrEF:  EF 35% 08/2022.  No IVF or diuretics needed today --> ok to give lasix if needs after blood transfusion. Meds limited by GFR.     **UTI: ESBL Klebsiella, sens only to imipenem. On meropenem.   **R renal hypoechoic structure:  rads rec 79mo f/u US ensure stable   **DM: insulin per primary.    **A fib:  on eliquis, BB, amiodarone.  Holding eliquis in light of poss GIB   Will follow, call with concerns.   Arita Miss, MD  09/14/2022, 11:34 AM  [facility]

## 2022-09-14 NOTE — Progress Notes (Signed)
SLP Cancellation Note  Patient Details Name: [name] [name] MRN: [id number] DOB: [birth year]   Cancelled treatment:       Reason Eval/Treat Not Completed: Patient at procedure or test/unavailable. Will f/u as able.    Gwynneth Aliment, M.A., CF-SLP Speech Language Pathology, Acute Rehabilitation Services  Secure Chat preferred [phone]  09/14/2022, 12:12 PM

## 2022-09-15 DIAGNOSIS — R7881 Bacteremia: Secondary | ICD-10-CM | POA: Diagnosis not present

## 2022-09-15 DIAGNOSIS — E871 Hypo-osmolality and hyponatremia: Secondary | ICD-10-CM | POA: Diagnosis not present

## 2022-09-15 DIAGNOSIS — I5022 Chronic systolic (congestive) heart failure: Secondary | ICD-10-CM | POA: Diagnosis not present

## 2022-09-15 DIAGNOSIS — N179 Acute kidney failure, unspecified: Secondary | ICD-10-CM | POA: Diagnosis not present

## 2022-09-15 DIAGNOSIS — I639 Cerebral infarction, unspecified: Secondary | ICD-10-CM | POA: Diagnosis not present

## 2022-09-15 LAB — GLUCOSE, CAPILLARY
Glucose-Capillary: 158 mg/dL — ABNORMAL HIGH (ref 70–99)
Glucose-Capillary: 141 mg/dL — ABNORMAL HIGH (ref 70–99)
Glucose-Capillary: 127 mg/dL — ABNORMAL HIGH (ref 70–99)
Glucose-Capillary: 135 mg/dL — ABNORMAL HIGH (ref 70–99)

## 2022-09-15 LAB — RENAL FUNCTION PANEL
Albumin: <1.5 g/dL — ABNORMAL LOW (ref 3.5–5.0)
Anion gap: 9 (ref 5–15)
BUN: 100 mg/dL — ABNORMAL HIGH (ref 8–23)
CO2: 24 mmol/L (ref 22–32)
Calcium: 7.8 mg/dL — ABNORMAL LOW (ref 8.9–10.3)
Chloride: 101 mmol/L (ref 98–111)
Creatinine, Ser: 3.58 mg/dL — ABNORMAL HIGH (ref 0.44–1.00)
GFR, Estimated: 12 mL/min — ABNORMAL LOW (ref >60)
Glucose, Bld: 149 mg/dL — ABNORMAL HIGH (ref 70–99)
Phosphorus: 3.5 mg/dL (ref 2.5–4.6)
Potassium: 4.6 mmol/L (ref 3.5–5.1)
Sodium: 134 mmol/L — ABNORMAL LOW (ref 135–145)

## 2022-09-15 LAB — CBC
HCT: 21 % — ABNORMAL LOW (ref 36.0–46.0)
Hemoglobin: 6.9 g/dL — CL (ref 12.0–15.0)
MCH: 28.9 pg (ref 26.0–34.0)
MCHC: 32.9 g/dL (ref 30.0–36.0)
MCV: 87.9 fL (ref 80.0–100.0)
Platelets: 264 10*3/uL (ref 150–400)
RBC: 2.39 MIL/uL — ABNORMAL LOW (ref 3.87–5.11)
RDW: 18.1 % — ABNORMAL HIGH (ref 11.5–15.5)
WBC: 17 10*3/uL — ABNORMAL HIGH (ref 4.0–10.5)
nRBC: 2.7 % — ABNORMAL HIGH (ref 0.0–0.2)

## 2022-09-15 LAB — PREPARE RBC (CROSSMATCH)

## 2022-09-15 LAB — TYPE AND SCREEN

## 2022-09-15 LAB — HEPATITIS B SURFACE ANTIBODY, QUANTITATIVE: Hep B S AB Quant (Post): <3.5 m[IU]/mL — ABNORMAL LOW

## 2022-09-15 LAB — CULTURE, BLOOD (ROUTINE X 2)

## 2022-09-15 MED ORDER — SODIUM CHLORIDE 0.9% IV SOLUTION: Freq: Once | INTRAVENOUS | Status: AC

## 2022-09-15 NOTE — Progress Notes (Signed)
[name] Progress Note   Subjective:    Temp R internal jugular HD cath yesterday then s/p HD#1, no UF did ok Hb 6.6 this AM, UOP 0.2L, SCr 4.6 AF, VSS Husband at bedside, updated  Objective Vitals:   09/15/22 1126 09/15/22 1219 09/15/22 1329 09/15/22 1330  BP: (!) 106/43 (!) 125/39 (!) 120/50 (!) 120/50  Pulse: (!) 57 (!) 56 (!) 55 (!) 55  Resp: 17 18 16 17   Temp: (!) 97.4 F (36.3 C) 97.7 F (36.5 C) 97.9 F (36.6 C) 97.6 F (36.4 C)  TempSrc: Oral Oral Oral Oral  SpO2: 97% 97% 95% 95%  Weight:      Height:       Physical Exam Gen: sleepy, no distress Eyes: anicteric, EOMI ENT: MMM CV:  RRR, no rub  Abd: soft, nontender Back:CTAB GU: purewick with yellow urine in cannister  Extr: 1+ LE edema Neuro: arousable but sleepy, answering questions Skin: no rashes noted  Additional Objective Labs: Basic Metabolic Panel: Recent Labs  Lab 09/13/22 0341 09/14/22 0728 09/15/22 0432  NA 131* 132* 134*  K 5.2* 5.2* 4.6  CL 98 103 101  CO2 21* 21* 24  GLUCOSE 109* 148* 149*  BUN 115* 134* 100*  CREATININE 5.08* 4.89* 3.58*  CALCIUM 8.3* 8.1* 7.8*  PHOS 4.6 4.5 3.5   Liver Function Tests: Recent Labs  Lab 09/13/22 0341 09/14/22 0728 09/15/22 0432  ALBUMIN <1.5* <1.5* <1.5*   No results for input(s): "LIPASE", "AMYLASE" in the last 168 hours. CBC: Recent Labs  Lab 09/11/22 0703 09/12/22 0601 09/13/22 0341 09/13/22 1156 09/14/22 0728 09/15/22 0432  WBC 17.8* 19.3* 16.0*  --  17.5* 17.0*  HGB 8.3* 7.0* 5.8* 6.5* 6.4* 6.9*  HCT 26.5* 22.1* 17.6* 20.0* 19.4* 21.0*  MCV 86.9 88.0 87.1  --  86.2 87.9  PLT 401* 393 361  --  303 264   Blood Culture    Component Value Date/Time   SDES BLOOD RIGHT HAND 09/12/2022 1434   SDES BLOOD LEFT HAND 09/12/2022 1434   SPECREQUEST  09/12/2022 1434    BOTTLES DRAWN AEROBIC AND ANAEROBIC Blood Culture adequate volume   SPECREQUEST  09/12/2022 1434    BOTTLES DRAWN AEROBIC AND ANAEROBIC Blood  Culture results may not be optimal due to an excessive volume of blood received in culture bottles   CULT  09/12/2022 1434    NO GROWTH 3 DAYS Performed at [facility], [address] [place], [place] [address]    CULT (A) 09/12/2022 1434    STAPHYLOCOCCUS EPIDERMIDIS THE SIGNIFICANCE OF ISOLATING THIS ORGANISM FROM A SINGLE SET OF BLOOD CULTURES WHEN MULTIPLE SETS ARE DRAWN IS UNCERTAIN. PLEASE NOTIFY THE MICROBIOLOGY DEPARTMENT WITHIN ONE WEEK IF SPECIATION AND SENSITIVITIES ARE REQUIRED. Performed at [facility], [address], [place], [place] [address]    REPTSTATUS PENDING 09/12/2022 1434   REPTSTATUS 09/15/2022 FINAL 09/12/2022 1434    Cardiac Enzymes: No results for input(s): "CKTOTAL", "CKMB", "CKMBINDEX", "TROPONINI" in the last 168 hours. CBG: Recent Labs  Lab 09/14/22 0647 09/14/22 0857 09/14/22 1609 09/15/22 0610 09/15/22 1113  GLUCAP 132* 148* 142* 158* 141*   Iron Studies:  No results for input(s): "IRON", "TIBC", "TRANSFERRIN", "FERRITIN" in the last 72 hours.  @lablastinr3 @ Studies/Results: IR Fluoro Guide CV Line Right  Result Date: 09/14/2022 CLINICAL DATA:  Dyspnea, chronic kidney disease EXAM: EXAM RIGHT IJ CATHETER PLACEMENT UNDER ULTRASOUND AND FLUOROSCOPIC GUIDANCE TECHNIQUE: The procedure, risks (including but not limited to bleeding, infection,  organ damage, pneumothorax), benefits, and alternatives were explained to the patient. Questions regarding the procedure were encouraged and answered. The patient understands and consents to the procedure. Patency of the right IJ vein was confirmed with ultrasound with image documentation. An appropriate skin site was determined. Skin site was marked. Region was prepped using maximum barrier technique including cap and mask, sterile gown, sterile gloves, large sterile sheet, and Chlorhexidine as cutaneous antisepsis. The region was infiltrated locally with 1% lidocaine. Under real-time ultrasound  guidance, the right IJ vein was accessed with a 21 gauge needle; the needle tip within the vein was confirmed with ultrasound image documentation. The needle exchanged over a 018 guidewire for vascular dilator which allowed advancement of a 20 cm Mahurkar catheter. This was positioned with the tip at the cavoatrial junction. Spot chest radiograph shows good positioning and no pneumothorax. Catheter was flushed and sutured externally with 0-Prolene suture. Patient tolerated the procedure well. FLUOROSCOPY TIME:  Radiation Exposure Index (as provided by the fluoroscopic device): 1 mGy air Kerma COMPLICATIONS: COMPLICATIONS none IMPRESSION: 1. Technically successful right IJ Mahurkar catheter placement. Electronically Signed   By: Corlis Leak M.D.   On: 09/14/2022 14:58   IR US Guide Vasc Access Right  Result Date: 09/14/2022 CLINICAL DATA:  Dyspnea, chronic kidney disease EXAM: EXAM RIGHT IJ CATHETER PLACEMENT UNDER ULTRASOUND AND FLUOROSCOPIC GUIDANCE TECHNIQUE: The procedure, risks (including but not limited to bleeding, infection, organ damage, pneumothorax), benefits, and alternatives were explained to the patient. Questions regarding the procedure were encouraged and answered. The patient understands and consents to the procedure. Patency of the right IJ vein was confirmed with ultrasound with image documentation. An appropriate skin site was determined. Skin site was marked. Region was prepped using maximum barrier technique including cap and mask, sterile gown, sterile gloves, large sterile sheet, and Chlorhexidine as cutaneous antisepsis. The region was infiltrated locally with 1% lidocaine. Under real-time ultrasound guidance, the right IJ vein was accessed with a 21 gauge needle; the needle tip within the vein was confirmed with ultrasound image documentation. The needle exchanged over a 018 guidewire for vascular dilator which allowed advancement of a 20 cm Mahurkar catheter. This was positioned with the  tip at the cavoatrial junction. Spot chest radiograph shows good positioning and no pneumothorax. Catheter was flushed and sutured externally with 0-Prolene suture. Patient tolerated the procedure well. FLUOROSCOPY TIME:  Radiation Exposure Index (as provided by the fluoroscopic device): 1 mGy air Kerma COMPLICATIONS: COMPLICATIONS none IMPRESSION: 1. Technically successful right IJ Mahurkar catheter placement. Electronically Signed   By: Corlis Leak M.D.   On: 09/14/2022 14:58   Medications:    amiodarone  200 mg Oral Daily   atorvastatin  40 mg Oral Daily   Chlorhexidine Gluconate Cloth  6 each Topical Q0600   darbepoetin (ARANESP) injection - NON-DIALYSIS  60 mcg Subcutaneous Q Sat-1800   ezetimibe  10 mg Oral Daily   feeding supplement (NEPRO CARB STEADY)  237 mL Oral Q24H   heparin sodium (porcine)  10 mL Intracatheter Once   hydrocortisone cream   Topical TID   insulin aspart  0-15 Units Subcutaneous TID WC   insulin aspart  0-5 Units Subcutaneous QHS   insulin aspart protamine- aspart  10 Units Subcutaneous BID WC   metoprolol succinate  25 mg Oral Daily   polyethylene glycol  17 g Oral Daily   sodium bicarbonate  650 mg Oral BID   witch hazel-glycerin   Topical TID    Assessment/PlanVirginia E Serita [name]  is an 82 y.o. female atrial fibrillation, DM type 2, HFrEF, HTN, HL,  currently admitted for ESBL E coli UTI and CVA for whom nephrology is consulted for evaluation and management of AKI on CKD.    **AKI on CKD:  baseline Cr was in mid 2s > 08/2022 admission 2.6 > 3.6 > 3 now presenting with Cr 4.55 > 4.6 > 4.7 > 4.8 > 5.1 > 4.9.  BUN increasing further.  Has uremic symptoms.  No nephrology care outpt.  UA consistent with UTI.  Imaging with no new findings - do not suspect mild hydro on L significantly contributing.  Modest hypotension noted - suspect ATN related to acute infection + hypovolemia.  S/p IV fluid challenge and kidney function continued to worsen and develooped uremic symptoms.    Temp R internal jugular HD cath placed 7/15 HD #1 7/15 Cont on MWF schedule for now, no heparin Start CLIP as AKI Will need to placed [facility] prior to DC, not done initially 2/2 infectious issues   **acute CVA: post central gyrus, neurology following.  PT/OT have been consulted.    **Hyponatremia:  mild, monitoring; will  improve with HD  **Hyperkalemia: improved / resolved with HD   **Anemia, normocytic:  Baseline in the past few months 8-9s.  Dropped this AM from 7-8s to 5.8 with report of dark stools. Iron sat 11% - s/p  IV. FE.  Started ESA 7/13.  Transfusion per primary.    **HFrEF:  EF 35% 08/2022.  Start gentle UF with HD   **UTI: ESBL Klebsiella, sens only to imipenem. On meropenem.   **R renal hypoechoic structure:  rads rec 45mo f/u US ensure stable   **DM: insulin per primary.    **A fib:  on  BB, amiodarone.  Holding eliquis in light of poss GIB   Will follow, call with concerns.   Arita Miss, MD  09/15/2022, 1:44 PM  [facility]

## 2022-09-15 NOTE — Progress Notes (Signed)
Speech Language Pathology Treatment: Cognitive-Linquistic  Patient Details Name: [name] [name] MRN: [id number] DOB: [birth year] Today's Date: 09/15/2022 Time: 4098-1191 SLP Time Calculation (min) (ACUTE ONLY): 10 min  Assessment / Plan / Recommendation Clinical Impression  Pt confirms that she has been experiencing changes in her cognition over the past two months. Due to pt's vision loss, SLP targeted attention and auditory comprehension through a verbal multi-step directions activity. Pt accurately followed 2/5 commands independently, which increased to 5/5 given Mod verbal cues. Her performance greatly benefited from increased wait time and repetition. Throughout the session, pt voiced her frustration with aging and repetitively recounted details from stories about her life. Pt is progressing with these targeted cognitive tasks, although question the impact of her memory/attention difficulties on higher level functional cognitive tasks. SLP will continue to f/u as able.     HPI HPI: Pt is an 82 yo female presenting from home to ED with weakness, dizziness, difficulty ambulating x 2 months, and suspected recurrent ESBL UTI. MRI Brain revealed small acute infarct in R postcentral gyrus and large area of L temporal encephalomalacia correlating with history of remote herpes encephalitis. PMH includes afib, CKD stage III, insulin-dependent DM2, CHF with reduced EF, HTN, HLD      SLP Plan  Continue with current plan of care      Recommendations for follow up therapy are one component of a multi-disciplinary discharge planning process, led by the attending physician.  Recommendations may be updated based on patient status, additional functional criteria and insurance authorization.    Recommendations                     Oral care BID   Frequent or constant Supervision/Assistance Cognitive communication deficit (Y78.295)     Continue with current plan of care     Gwynneth Aliment,  M.A., CF-SLP Speech Language Pathology, Acute Rehabilitation Services  Secure Chat preferred [phone]   09/15/2022, 4:03 PM

## 2022-09-15 NOTE — Progress Notes (Addendum)
Requested to see pt for out-pt HD needs at d/c. Chart reviewed and noted pt for snf placement at d/c. Communicated with CSW to inquire which clinics snf will transport pt to. CSW to contact snf and will provide update to navigator so referral can be made. Will assist as needed.   Olivia Canter Renal Navigator [phone]  Addendum at 4:41: Advised by CSW that snf can transport to [facility]) to an appt after 9:00 am. Referral submitted to [facility] admissions this afternoon for review.

## 2022-09-15 NOTE — Progress Notes (Addendum)
   09/14/22 2318  Vitals  BP (!) 96/44  BP Location Right Arm  BP Method Automatic  Patient Position (if appropriate) Lying  Pulse Rate 70  Pulse Rate Source Monitor  Resp 17  Oxygen Therapy  SpO2 97 %  O2 Device Room Air  During Treatment Monitoring  Intra-Hemodialysis Comments Tx completed;Tolerated well  Post Treatment  Dialyzer Clearance Lightly streaked  Hemodialysis Intake (mL) 100 mL  Liters Processed 24  Fluid Removed (mL) 0 mL  Tolerated HD Treatment  (Pt was hypotensive during treatment.)  Post-Hemodialysis Comments Pt stable  Hemodialysis Catheter Right Internal jugular Triple lumen Temporary (Non-Tunneled)  Placement Date/Time: 09/14/22 1236   Serial / Lot #: [id number]  Expiration Date: 02/19/27  Time Out: Correct patient;Correct site;Correct procedure  Maximum sterile barrier precautions: Hand hygiene;Cap;Mask;Sterile gown;Sterile gloves;Large sterile ...  Site Condition No complications  Blue Lumen Status Heparin locked  Red Lumen Status Heparin locked  Purple Lumen Status Heparin locked  Catheter fill solution Heparin 1000 units/ml  Catheter fill volume (Arterial) 1.5 cc  Catheter fill volume (Venous) 1.5  Dressing Type Transparent  Dressing Status Clean, Dry, Intact  Interventions Dressing changed  Drainage Description None  Dressing Change Due 09/21/22  Post treatment catheter status Capped and Clamped   Pt was hypotensive during treatment.   Wgt 82.1kg Report Tama Gander RN

## 2022-09-15 NOTE — Plan of Care (Signed)
  Problem: Education: Goal: Ability to describe self-care measures that may prevent or decrease complications (Diabetes Survival Skills Education) will improve Outcome: Progressing   Problem: Coping: Goal: Ability to adjust to condition or change in health will improve Outcome: Progressing   Problem: Nutritional: Goal: Maintenance of adequate nutrition will improve Outcome: Progressing   Problem: Self-Care: Goal: Ability to participate in self-care as condition permits will improve Outcome: Progressing   Problem: Pain Managment: Goal: General experience of comfort will improve Outcome: Progressing

## 2022-09-15 NOTE — Progress Notes (Signed)
  Progress Note   Patient: [name] [name] ZOX:[id number] DOB: [birth year] DOA: 09/06/2022     9 DOS: the patient was seen and examined on 09/15/2022 at 8:46 AM      Brief hospital course: Mrs. Moncada is an 82 y.o. F with CKD IV baseline ~1.7-2.2, DM, sCHF EF 35-40%, hx HSV encephalitis 2014, pAF on Eliquis, seizures, hx ESBL UTI who presented with dizziness, found to have stroke, acute renal failure and ESBL bacteremia.  Requiring new start dialysis in the hospital.         Assessment and Plan: * Acute renal failure superimposed on stage 4 chronic kidney disease (HCC) HD yesterday, Nephrology plan again tomorrow - HD per Nephrology - ESA, rocaltrol, phos binders per Nephrology - Consult Palliative   Acute CVA (cerebrovascular accident) (HCC) - Continue Lipitor, Zetia - Avoid aspirin and Eliquis for now   ESBL Bacteremia due to UTI Completed 7 days Meropenem.  WBC 23 > 17K.  Discussed with ID RPh, 7/8 cultures were drawn within minutes of first dose antibiotics, and so this was too early and we do not think the ESBL that grew in 1/3 on day 5 represents treatment failure, just drawing of cultures too close. - Follow 7/13 cultures  Anemia in chronic kidney disease (CKD) Hgb down to 6.9 again.  No bowel movements in the last 48 hours, doubt hemodynamically significant bleeding, though possibly using.  Given daily transfusion, I do not think it safe to continue Eliquis at this time, despite stroke - Transfuse 1 unit PRBCs today - Transfusion threshold 7 g/dL - Hold Eliquis - Monitor FOBT if she has a bowel movement  Hyponatremia - Fluid mgmt per HD   Hyperkalemia Resolved with HD  Chronic systolic CHF (congestive heart failure) (HCC) No hypoxia. - Hold furosemide - Continue metoprolol - Volume management with HD  CAD S/P percutaneous coronary angioplasty -Continue statin, Zetia, metoprolol - Hold Eliquis  Diabetes (HCC) Glucoses controlled - Continue NPH -  Continue sliding scale corrections   Seizure disorder (HCC) No longer on AED  Paroxysmal atrial fibrillation (HCC) Rate controlled - Continue amiodarone, metoprolol -Hold Eliquis  Essential hypertension BP soft - Continue metoprolol  Hyperlipidemia - Continue Lipitor and Zetia          Subjective: Patient still feels very weak and tired, had dialysis yesterday afternoon, had a lot of itching in the right arm afterwards.  No swelling, no fever, no respiratory symptoms.     Physical Exam: BP (!) 120/50   Pulse (!) 55   Temp 97.6 F (36.4 C) (Oral)   Resp 17   Ht 5\' 9"  (1.753 m)   Wt 82.1 kg   SpO2 95%   BMI 26.73 kg/m   Elderly adult female, lying in bed, no acute distress, sluggish, pale RRR, no murmurs, diffuse nonpitting peripheral edema Respiratory rate normal, lungs clear without rales or wheezes Abdomen soft without guarding or rigidity Attention diminished, affect blunted, judgment and insight are impaired, she has severe generalized weakness  Data Reviewed: Basic metabolic panel shows BUN unchanged, creatinine unchanged, potassium resolved CBC shows hemoglobin down to 6.9 again, white blood cell count still 17 K      Disposition: Status is: Inpatient         Author: Alberteen Sam, MD 09/15/2022 2:49 PM  For on call review [email or web address].

## 2022-09-15 NOTE — Progress Notes (Signed)
Patient refused vitals.

## 2022-09-15 NOTE — TOC Progression Note (Signed)
Transition of Care [facility]) - Progression Note    Patient Details  Name: [name] [name] MRN: [id number] Date of Birth: Jun 04, 1940  Transition of [facility]) CM/SW Contact  Baldemar Lenis, [place] Phone Number: 09/15/2022, 2:18 PM  Clinical Narrative:   CSW notified by RN that patient's husband was asking where the patient was going for SNF. CSW met with patient's spouse to remind him that he chose [facility], and spouse indicated understanding as she has been there before. Spouse wanted to make sure he could find it by putting it in his GPS. CSW discussed that the SNF may change if patient will need permanent HD, as CSW is unsure if [facility] can accommodate the patient's dialysis until we know more about that. Spouse indicated understanding. CSW to follow.    Expected Discharge Plan: Skilled Nursing Facility Barriers to Discharge: Continued Medical Work up, English as a second language teacher  Expected Discharge Plan and Services     Post Acute Care Choice: NA Living arrangements for the past 2 months: Single Family Home                                       Social Determinants of Health (SDOH) Interventions SDOH Screenings   Food Insecurity: No Food Insecurity (09/06/2022)  Housing: Low Risk  (09/06/2022)  Transportation Needs: No Transportation Needs (09/06/2022)  Utilities: Not At Risk (09/06/2022)  Alcohol Screen: Low Risk  (08/22/2020)  Depression (PHQ2-9): Low Risk  (08/21/2022)  Financial Resource Strain: Low Risk  (08/22/2020)  Physical Activity: Inactive (08/22/2020)  Social Connections: Moderately Integrated (08/22/2020)  Stress: No Stress Concern Present (08/22/2020)  Tobacco Use: Medium Risk (09/07/2022)    Readmission Risk Interventions    08/06/2022    3:31 PM 07/23/2022   12:43 PM  Readmission Risk Prevention Plan  Transportation Screening Complete Complete  PCP or Specialist Appt within 5-7 Days  Complete  PCP or Specialist Appt within 3-5 Days Complete   Home Care  Screening  Complete  Medication Review (RN CM)  Referral to Pharmacy  HRI or Home Care Consult Complete   Social Work Consult for Recovery Care Planning/Counseling --   Palliative Care Screening Not Applicable   Medication Review Oceanographer) Complete

## 2022-09-15 NOTE — Plan of Care (Signed)
  Problem: Education: Goal: Ability to describe self-care measures that may prevent or decrease complications (Diabetes Survival Skills Education) will improve Outcome: Progressing Goal: Individualized Educational Video(s) Outcome: Progressing   

## 2022-09-15 NOTE — Consult Note (Signed)
Consultation Note Date: 09/15/2022   Patient Name: [name] [name]  DOB: 24-Apr-1940  MRN: [id number]  Age / Sex: 82 y.o., female  PCP: [name], MD Referring Physician: Alberteen Sam, *  Reason for Consultation:  goals of care  HPI/Patient Profile: 82 y.o. 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 Fib on eliquis, urinary retention with frequent UTIs, seizures, blind in L eye, admitted on 09/06/2022 with weakness and dizziness.  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 Blood culture positive for Klebsiella pneumoniae. Recovery has been complicated by anemia and renal failure. Dialysis was started yesterday. Palliative medicine consulted for GOC.   Primary Decision Maker NEXT OF KIN - spouse- Kenyon Eshleman  Discussion: Chart reviewed including labs, progress notes, imaging from this and previous encounters. Patient has yellow DNR form on file, but is currently Full Code.  On evaluation patient was alert, but very poor historian. She answered questions circuitously. Living at home prior to admission and reports her family recently moved in with her. She was unable to tell me the reason for her hospitalization, what dialysis is, and what dialysis is doing for her. She was unable to elicit GOC.  I called and spoke with her spouse. Per spouse patient was living home prior to admission, independent with ADL's.  Attempted to discuss patient's current illness state and GOC. Mr. Zalar was very focused on patient's discharge- he reports patient is "fine" and is planning for discharge to rehab very soon.  Efforts were made to discuss patient's serious illness and risks of future complications, advanced care planning and goals of care.  Patient's spouse requested  to postpone discussion until he can be present with patient- he believes patient can express her own goals of care.    SUMMARY OF RECOMMENDATIONS -Continue current plan -PMT will reach out to spouse tomorrow morning for followup meeting -Pt has Gold DNR form in ACP section of chart- recommend discussing code status    Code Status/Advance Care Planning: Full code   Prognosis:   Unable to determine  Discharge Planning: To Be Determined  Primary Diagnoses: Present on Admission:  Acute CVA (cerebrovascular accident) (HCC)  Hyperlipidemia  Essential hypertension  CKD (chronic kidney disease) stage 4, GFR 15-29 ml/min (HCC)  Paroxysmal atrial fibrillation (HCC)  ESBL Bacteremia due to UTI  Acute renal failure superimposed on stage 4 chronic kidney disease (HCC)  Hyponatremia   Review of Systems  Physical Exam  Vital Signs: BP (!) 120/50   Pulse (!) 55   Temp 97.6 F (36.4 C) (Oral)   Resp 17   Ht 5\' 9"  (1.753 m)   Wt 82.1 kg   SpO2 95%   BMI 26.73 kg/m  Pain Scale: 0-10   Pain Score: Asleep   SpO2: SpO2: 95 % O2 Device:SpO2: 95 % O2 Flow Rate: .O2 Flow Rate (L/min): 2 L/min  IO: Intake/output summary:  Intake/Output  Summary (Last 24 hours) at 09/15/2022 1507 Last data filed at 09/15/2022 1330 Gross per 24 hour  Intake 527 ml  Output 0 ml  Net 527 ml    LBM: Last BM Date : 09/13/22 Baseline Weight: Weight: 76 kg Most recent weight: Weight: 82.1 kg       Thank you for this consult. Palliative medicine will continue to follow and assist as needed.  Time Total: 80 minutes Greater than 50%  of this time was spent counseling and coordinating care related to the above assessment and plan.  Signed by: Ocie Bob, AGNP-C Palliative Medicine    Please contact Palliative Medicine Team phone at [phone] for questions and concerns.  For individual provider: See Loretha Stapler

## 2022-09-15 NOTE — Plan of Care (Signed)
  Problem: Education: Goal: Ability to describe self-care measures that may prevent or decrease complications (Diabetes Survival Skills Education) will improve Outcome: Progressing   Problem: Coping: Goal: Ability to adjust to condition or change in health will improve Outcome: Progressing   

## 2022-09-15 NOTE — Telephone Encounter (Signed)
Pt is in the hospital per husband.  I advised that he can call us back to let us know if he wants to schedule injection.

## 2022-09-16 DIAGNOSIS — Z7189 Other specified counseling: Secondary | ICD-10-CM

## 2022-09-16 DIAGNOSIS — Z515 Encounter for palliative care: Secondary | ICD-10-CM

## 2022-09-16 DIAGNOSIS — I639 Cerebral infarction, unspecified: Secondary | ICD-10-CM | POA: Diagnosis not present

## 2022-09-16 DIAGNOSIS — R7881 Bacteremia: Secondary | ICD-10-CM | POA: Diagnosis not present

## 2022-09-16 DIAGNOSIS — Z66 Do not resuscitate: Secondary | ICD-10-CM

## 2022-09-16 DIAGNOSIS — N179 Acute kidney failure, unspecified: Secondary | ICD-10-CM | POA: Diagnosis not present

## 2022-09-16 LAB — CBC
HCT: 22.9 % — ABNORMAL LOW (ref 36.0–46.0)
Hemoglobin: 7.5 g/dL — ABNORMAL LOW (ref 12.0–15.0)
MCH: 29.2 pg (ref 26.0–34.0)
MCHC: 32.8 g/dL (ref 30.0–36.0)
MCV: 89.1 fL (ref 80.0–100.0)
Platelets: 237 10*3/uL (ref 150–400)
RBC: 2.57 MIL/uL — ABNORMAL LOW (ref 3.87–5.11)
RDW: 19.2 % — ABNORMAL HIGH (ref 11.5–15.5)
WBC: 15.7 10*3/uL — ABNORMAL HIGH (ref 4.0–10.5)
nRBC: 2.2 % — ABNORMAL HIGH (ref 0.0–0.2)

## 2022-09-16 LAB — TYPE AND SCREEN
Unit division: 0
Unit division: 0

## 2022-09-16 LAB — GLUCOSE, CAPILLARY
Glucose-Capillary: 128 mg/dL — ABNORMAL HIGH (ref 70–99)
Glucose-Capillary: 110 mg/dL — ABNORMAL HIGH (ref 70–99)
Glucose-Capillary: 128 mg/dL — ABNORMAL HIGH (ref 70–99)
Glucose-Capillary: 68 mg/dL — ABNORMAL LOW (ref 70–99)
Glucose-Capillary: 107 mg/dL — ABNORMAL HIGH (ref 70–99)

## 2022-09-16 LAB — RENAL FUNCTION PANEL
Albumin: <1.5 g/dL — ABNORMAL LOW (ref 3.5–5.0)
Anion gap: 6 (ref 5–15)
BUN: 109 mg/dL — ABNORMAL HIGH (ref 8–23)
CO2: 25 mmol/L (ref 22–32)
Calcium: 8.2 mg/dL — ABNORMAL LOW (ref 8.9–10.3)
Chloride: 105 mmol/L (ref 98–111)
Creatinine, Ser: 3.85 mg/dL — ABNORMAL HIGH (ref 0.44–1.00)
GFR, Estimated: 11 mL/min — ABNORMAL LOW (ref >60)
Glucose, Bld: 122 mg/dL — ABNORMAL HIGH (ref 70–99)
Phosphorus: 4.1 mg/dL (ref 2.5–4.6)
Potassium: 4.5 mmol/L (ref 3.5–5.1)
Sodium: 136 mmol/L (ref 135–145)

## 2022-09-16 LAB — BPAM RBC
Blood Product Expiration Date: 202407202359
Blood Product Expiration Date: 202407212359
Blood Product Expiration Date: 202408092359
ISSUE DATE / TIME: 202407140801
ISSUE DATE / TIME: 202407150933
ISSUE DATE / TIME: 202407161054
Unit Type and Rh: 600
Unit Type and Rh: 600
Unit Type and Rh: 600

## 2022-09-16 MED ORDER — LORAZEPAM 2 MG/ML IJ SOLN
0.5000 mg | Freq: Once | INTRAMUSCULAR | Status: AC
  Administered 2022-09-16: 0.5 mg via INTRAVENOUS
  Filled 2022-09-16: qty 1

## 2022-09-16 MED ORDER — HEPARIN SODIUM (PORCINE) 1000 UNIT/ML IJ SOLN
INTRAMUSCULAR | Status: AC
  Administered 2022-09-16: 1000 [IU]
  Filled 2022-09-16: qty 3

## 2022-09-16 NOTE — Plan of Care (Signed)
  Problem: Education: Goal: Ability to describe self-care measures that may prevent or decrease complications (Diabetes Survival Skills Education) will improve Outcome: Not Progressing   Problem: Nutritional: Goal: Maintenance of adequate nutrition will improve Outcome: Not Progressing   Problem: Skin Integrity: Goal: Risk for impaired skin integrity will decrease Outcome: Progressing

## 2022-09-16 NOTE — Progress Notes (Signed)
Pt was medicated with 0.5 mg ativan per order, pt was sleeping after given medication, was easy to arousal and no distress noted, but bedside RN was made aware of pt status, that the pt. Is sleeping at this time, report given to RN Samer Harbawi.

## 2022-09-16 NOTE — Progress Notes (Signed)
Progress Note   Patient: [name] [name] DGL:[id number] DOB: Dec 09, 1940 DOA: 09/06/2022     10 DOS: the patient was seen and examined on 09/16/2022 at 9:32AM      Brief hospital course: [name] [name] is an 82 y.o. F with CKD IV baseline ~1.7-2.2, DM, sCHF EF 35-40%, hx HSV encephalitis 2014, pAF on Eliquis, seizures, hx ESBL UTI who presented with dizziness, found to have stroke, acute renal failure and ESBL bacteremia.    Significant events: 7/7: Admitted for stroke, on antibiotics/gentle IVF; Neuro consulted 7/8: Nephrology consulted; BCx growing Klebsiella 7/10: Cr worse, IVF restarted 7/14: Hgb down to 6, transfused 2 units 7/15: Vascath placed, underwent first HD, required 3rd unit PRBCs 7/16: Hgb again <7, required 4th unit PRBC 7/17: HD #2    Significant studies: 7/7: CXR -- perihilar opacities, c/w infection or edema 7/7: MRI brain/MRA H&N -- small R postcentral gyrus infarct, no significant vessel disease  7/8: US renal -- unchanged mild L hydro, 1.5 cm cyst   Significant microbiology data: 7/7 BCx x2: ESBL Klebsiella in 2/2 7/7 UCx: ESBL Klebsiella 7/8 BCx x2: ESBL in 1/3 (although this was drawn in very close proximity to meropenem) 7/13 BCx x2: MRSE in 1/3 (likely contaminant)    Procedures: 7/15: Vascath placed, first HD 7/17: HD#2   Consults: Neurology Nephrology      Assessment and Plan: * Acute renal failure superimposed on stage 4 chronic kidney disease (HCC) Hyponatremia Hyperkalemia SPEP negative.  [place] without clinically significant disease.  Suspect this is ischemic ATN in setting of ESBL bacteremia and CHF and chronic kidney disease.  UOP minimal.   - HD per Nephrology - Consult Nephrology - Consult Palliative care     ESBL Bacteremia due to UTI Found on admission to have bacteremia.  Cultures growing ESBL.  Repeat blood cultures from 7/8 growing ESBL in 1 of 3, however this was drawn within minutes of first dose of meropenem, so we do  not feel this was from treatment failure.  Completed 7 days Meropenem - Follow cultures from 7/13 - If 7/13 cultures negative for E coli, would recommend transitioning from right internal jugular catheter to tunneled catheter     Acute CVA (cerebrovascular accident) (HCC) MRI brain on admission showed right post-central gyrus infarct - Non-invasive angiography showed no significant disease - Echocardiogram completed 1 month ago showed no cardiogenic source of embolism - Lipids ordered: LDL undetectable - Continue Lipitor, Zetia - Aspirin deferred in place of Eliquis - Afib known, stable on Eliquis and amiodarone - tPA not given because outside window - Dysphagia screen completed - PT eval ordered: recommend SNF   Anemia in chronic kidney disease (CKD) Iron studies normal.  Hemoglobin has trended down several the last few days, required 2 units transfusion 3 days ago, 1 unit the next day, and a fourth unit yesterday   Hgb today up to 7.5 and stable There was some concern for "dark stools" but this was transient, and no BM in 3 days so I do not suspect clinically significant GI bleeding - Trend Hgb - Transfusion threshold 7 g/dL - EPO per Nephrology    Chronic systolic CHF (congestive heart failure) (HCC) - Fluid management by HD - Continue metoprolol  Paroxysmal atrial fibrillation (HCC) Rate controlled - Continue amiodarone, Eliquis, metoprolol   CAD S/P percutaneous coronary angioplasty - Continue atorvastatin, Zetia, metoprolol, Eliquis  Diabetes (HCC) Glucoses controlled - Continue NPH - Continue sliding scale corrections   Seizure disorder (HCC) No longer on  AED  Essential hypertension BP normal - Continue metoprolol  Hyperlipidemia - Continue Lipitor and Zetia              Subjective: Patient seen on dialysis, she is extremely weak and tired, anxious from getting dialysis.  No fever, no confusion, no respiratory distress.     Physical  Exam: BP (!) 109/51 (BP Location: Right Arm)   Pulse (!) 56   Temp 98.1 F (36.7 C) (Axillary)   Resp 18   Ht 5\' 9"  (1.753 m)   Wt (S) 82 kg   SpO2 94%   BMI 26.70 kg/m   Pale elderly female, lying in bed, no acute distress  Data Reviewed: CBC shows hemoglobin up to 7.5, white blood cell count down to 15 Creatinine down to 3.8      Disposition: Status is: Inpatient The patient was admitted for renal failure, stroke, and ESBL bacteremia  Her bacteremia is treated, her stroke is stable, but her renal failure has not resolved and she has had to start dialysis.  Overall she is very poor prognosis, and will be day-to-day, palliative consultation appreciated        Author: Alberteen Sam, MD 09/16/2022 5:38 PM  For on call review [email or web address].

## 2022-09-16 NOTE — Procedures (Signed)
I was present at this dialysis session. I have reviewed the session itself and made appropriate changes.   2K bath UF goal of 1L.  No c/o this AM.  Hb 7.5, better.  K 4.5,    CLIP as outpt in process, looks like maybe to [employer].    Palliative following.    Still with temp HD cath.  WBC downtrending.  AF.  Consider reaching out to IR for [facility] tomorrow.   Filed Weights   09/14/22 2333 09/16/22 0500 09/16/22 0811  Weight: 82.1 kg 84.1 kg 82.6 kg    Recent Labs  Lab 09/16/22 0717  NA 136  K 4.5  CL 105  CO2 25  GLUCOSE 122*  BUN 109*  CREATININE 3.85*  CALCIUM 8.2*  PHOS 4.1    Recent Labs  Lab 09/14/22 0728 09/15/22 0432 09/16/22 0717  WBC 17.5* 17.0* 15.7*  HGB 6.4* 6.9* 7.5*  HCT 19.4* 21.0* 22.9*  MCV 86.2 87.9 89.1  PLT 303 264 237    Scheduled Meds:  amiodarone  200 mg Oral Daily   atorvastatin  40 mg Oral Daily   Chlorhexidine Gluconate Cloth  6 each Topical Q0600   darbepoetin (ARANESP) injection - NON-DIALYSIS  60 mcg Subcutaneous Q Sat-1800   ezetimibe  10 mg Oral Daily   feeding supplement (NEPRO CARB STEADY)  237 mL Oral Q24H   heparin sodium (porcine)  10 mL Intracatheter Once   hydrocortisone cream   Topical TID   insulin aspart  0-15 Units Subcutaneous TID WC   insulin aspart  0-5 Units Subcutaneous QHS   insulin aspart protamine- aspart  10 Units Subcutaneous BID WC   metoprolol succinate  25 mg Oral Daily   polyethylene glycol  17 g Oral Daily   sodium bicarbonate  650 mg Oral BID   witch hazel-glycerin   Topical TID   Continuous Infusions: PRN Meds:.acetaminophen, mouth rinse, senna-docusate   Sabra Heck  MD 09/16/2022, 9:06 AM

## 2022-09-16 NOTE — Progress Notes (Signed)
Daily Progress Note   Patient Name: [name] [name]       Date: 09/16/2022 DOB: 17-Feb-1941  Age: 82 y.o. MRN#: [id number] Attending Physician: Alberteen Sam, * Primary Care Physician: [name], MD Admit Date: 09/06/2022  Reason for Consultation/Follow-up: Establishing goals of care  Subjective: Patient asleep after receiving ativan during my visit - unable to verbalize  Length of Stay: 10  Current Medications: Scheduled Meds:   amiodarone  200 mg Oral Daily   atorvastatin  40 mg Oral Daily   Chlorhexidine Gluconate Cloth  6 each Topical Q0600   darbepoetin (ARANESP) injection - NON-DIALYSIS  60 mcg Subcutaneous Q Sat-1800   ezetimibe  10 mg Oral Daily   feeding supplement (NEPRO CARB STEADY)  237 mL Oral Q24H   heparin sodium (porcine)  10 mL Intracatheter Once   hydrocortisone cream   Topical TID   insulin aspart  0-15 Units Subcutaneous TID WC   insulin aspart  0-5 Units Subcutaneous QHS   insulin aspart protamine- aspart  10 Units Subcutaneous BID WC   metoprolol succinate  25 mg Oral Daily   polyethylene glycol  17 g Oral Daily   sodium bicarbonate  650 mg Oral BID   witch hazel-glycerin   Topical TID    Continuous Infusions:   PRN Meds: acetaminophen, mouth rinse, senna-docusate  Physical Exam Constitutional:      General: She is not in acute distress.    Appearance: She is ill-appearing.     Comments: Sleeping soundly after ativan administration  Pulmonary:     Effort: Pulmonary effort is normal.  Skin:    General: Skin is warm and dry.             Vital Signs: BP (!) 104/48   Pulse (!) 56   Temp 98.2 F (36.8 C)   Resp 16   Ht 5\' 9"  (1.753 m)   Wt (S) 82 kg   SpO2 94%   BMI 26.70 kg/m  SpO2: SpO2: 94 % O2 Device: O2 Device: Room Air O2 Flow Rate:  O2 Flow Rate (L/min): 2 L/min  Intake/output summary:  Intake/Output Summary (Last 24 hours) at 09/16/2022 1243 Last data filed at 09/16/2022 1159 Gross per 24 hour  Intake 340 ml  Output 400 ml  Net -60 ml   LBM: Last BM Date : 09/13/22 Baseline Weight: Weight: 76 kg Most recent weight: Weight: (S) 82 kg       Palliative Assessment/Data: PPS 50%      Patient Active Problem List   Diagnosis Date Noted   Hyperkalemia 09/11/2022   Anemia in chronic kidney disease (CKD) 09/09/2022   Chronic systolic CHF (congestive heart failure) (HCC) 09/09/2022   ESBL Bacteremia due to UTI 09/07/2022   Acute CVA (cerebrovascular accident) (HCC) 09/06/2022   A-fib (HCC) 08/04/2022   Atrial fibrillation with rapid ventricular response (HCC) 08/03/2022   Acute renal failure superimposed on stage 4 chronic kidney disease (HCC) 07/20/2022   Supratherapeutic INR 07/08/2022   Hemorrhagic cystitis 07/08/2022   Gross hematuria 07/07/2022   CKD (chronic kidney disease) stage 4, GFR 15-29 ml/min (HCC) 04/10/2020   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   Osteoporosis 07/22/2018   Closed  compression fracture of L1 vertebra (HCC) 07/19/2018   Cardiomyopathy (HCC) 04/07/2018   Foot ulcer (HCC) 07/24/2016   Gram-negative infection    CKD (chronic kidney disease) stage 3, GFR 30-59 ml/min (HCC)    Postoperative anemia due to acute blood loss 01/04/2016   Left hip postoperative wound infection 01/02/2016   Paroxysmal atrial fibrillation (HCC) 05/20/2015   CAD S/P percutaneous coronary angioplasty 05/20/2015   Acute systolic CHF (congestive heart failure) (HCC) 05/20/2015   Acute kidney injury superimposed on chronic kidney disease (HCC) 05/11/2015   PCP NOTES >>>>>>>>>>>>>>>> 12/28/2014   Memory loss 05/17/2014   Diabetes (HCC) 01/23/2014   Anxiety and depression, PCP notes  11/24/2013   Severe obesity (BMI >= 40) (HCC) 07/14/2013   Encounter for therapeutic drug monitoring 06/02/2013   History of  encephalitis 11/18/2012   Intertrochanteric fracture of right hip (HCC) 07/13/2012   GERD (gastroesophageal reflux disease) 10/05/2011   Seizure disorder (HCC) 05/24/2011   Hyponatremia 05/17/2011   Annual physical exam >>>>>>>>>>>>>>>>>>>>>>>>>>>>>>>.. 02/10/2011   LUNG NODULE 09/01/2006   Hyperlipidemia 03/27/2006   Hereditary and idiopathic peripheral neuropathy 03/27/2006   RETINOPATHY, BACKGROUND NOS 03/27/2006   Essential hypertension 03/27/2006    Palliative Care Assessment & Plan   HPI: 82 y.o. 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 Fib on eliquis, urinary retention with frequent UTIs, seizures, blind in L eye, admitted on 09/06/2022 with weakness and dizziness.  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 Blood culture positive for Klebsiella pneumoniae. Recovery has been complicated by anemia and renal failure. Dialysis was started yesterday. Palliative medicine consulted for GOC.   Assessment: I have reviewed medical records including EPIC notes, labs and imaging, assessed the patient and then met with patient's spouse Jonny Ruiz  to discuss diagnosis prognosis, GOC, EOL wishes, disposition and options.  I introduced Palliative Medicine as specialized medical care for people living with serious illness. It focuses on providing relief from the symptoms and stress of a serious illness. The goal is to improve quality of life for both the patient and the family.  We discussed a brief life review of the patient. [name] tells me they have been married over 30 years. He tells me their son passed away about 9 years ago. He tells me about their relationships with their grandchildren.  As far as functional and nutritional status, he tells me prior to current illness patient lived at home with him. She used a walker for ambulation. She was able to  independently care for herself. She cooked and did laundry in the home. He does share of some decrease in appetite at home - calls it "mediocre". He tells me at baseline she was forgetful but still able to express herself, make decisions for herself. He tells me she still managed the homes finances.    We discussed patient's current illness and what it means in the larger context of patient's on-going co-morbidities.  Natural disease trajectory and expectations at EOL were discussed.  We discussed her acute renal failure and new need for hemodialysis.  We discussed her acute CVA.  We discussed her bacteremia.  We discussed her multiple chronic conditions that are currently being managed.  We discussed concern of ongoing complications.  Spouse expresses understanding.  I attempted to elicit values and goals of care important to the patient.    The difference between aggressive medical intervention and  comfort care was considered in light of the patient's goals of care.   Encouraged spouse to consider DNR/DNI status understanding evidenced based poor outcomes in similar hospitalized patients, as the cause of the arrest is likely associated with chronic/terminal disease rather than a reversible acute cardio-pulmonary event.  Spouse shares patient would want DNR/DNI status and he agrees.  He would like to continue all other care for now and allow time for outcomes.  Discussed with spouse the importance of continued conversation with family and the medical providers regarding overall plan of care and treatment options, ensuring decisions are within the context of the patient's values and GOCs.    Palliative Care services outpatient were explained and offered.  Spouse agrees to palliative care following at [facility].  Questions and concerns were addressed. The family was encouraged to call with questions or concerns.    Recommendations/Plan: CODE STATUS changed to DNR/DNI, signed DNR placed on  chart Continue all other measures, allow time for outcomes TOC consult for outpatient palliative referral Spouse understands that patient has serious illness and is at risk for ongoing complications  Code Status: DNR  Discharge Planning: Skilled Nursing Facility for rehab with Palliative care service follow-up  Care plan was discussed with patient's spouse, RN, Dr. Maryfrances Bunnell, [facility] team  Thank you for allowing the Palliative Medicine Team to assist in the care of this patient.   *Please note that this is a verbal dictation therefore any spelling or grammatical errors are due to the "[employer]" system interpretation.  Gerlean Ren, DNP, [facility] Palliative Medicine Team Team Phone # [phone]  Pager [phone]

## 2022-09-16 NOTE — Progress Notes (Signed)
Physical Therapy Treatment Patient Details Name: [name] [name] MRN: [id number] DOB: [birth year] Today's Date: 09/16/2022   History of Present Illness Pt is an 82 y/o female who presents 09/06/22 s/p syncope and fall while walking to the car as family was attempting to bring her to the ED for vomiting, weakness. Of note, pt with recent admission with d/c 07/10/22 from [facility] for UTI and hematura with elevated INR (discharged with foley). PMH significant for A fib, L eye blindness, DM II, HTN, osteopenia, pelvic fracture 2018, seizures, systolic CHF, ankle fx surgery 2017, IM nail R in 2014 and L 2017, I&D hip 2017, B shoulder surgery.    PT Comments  Pt had HD earlier today along with ativan around noon. She was very lethargic today, keeping her eyes closed the majority of the session and needing increased time for cues and assistance with all functional mobility. She was also limited in standing and progressing OOB mobility by her BP dropping, see below, and by her buttocks pain. Of note, checked buttocks for wounds but no redness noted. She partially stood 1x with maxA, but demonstrated improved initiation with lateral scooting along EOB, min-modA. Pt was able to tolerate several bed level exercises to promote lower extremity ROM and strengthening. Will continue to follow acutely.  Vitals:  115/46 (68) & 49 bpm supine 102/53 (61) & 57 bpm sitting SBP 90s sitting after briefly standing (could not stand long enough to get BP) 91/64 (74) & 128 bpm supine after scooting along EOB 104/39 & 49 bpm supine several minutes end of session  *pt reports feeling lightheaded throughout session, noted to be pale when returned to supine after scooting along EOB     Assistance Recommended at Discharge Frequent or constant Supervision/Assistance  If plan is discharge home, recommend the following:  Can travel by private vehicle    A lot of help with bathing/dressing/bathroom;Assistance with  cooking/housework;Direct supervision/assist for medications management;Direct supervision/assist for financial management;Help with stairs or ramp for entrance;Assist for transportation;Two people to help with walking and/or transfers;Assistance with feeding   Yes  Equipment Recommendations  BSC/3in1;Wheelchair (measurements PT);Wheelchair cushion (measurements PT);Rolling walker (2 wheels);Hospital bed;Other (comment) (hoyer lift (mechanical))    Recommendations for Other Services       Precautions / Restrictions Precautions Precautions: Fall Precaution Comments: watch BP (orthostatic 7/17); L eye blindness Restrictions Weight Bearing Restrictions: No     Mobility  Bed Mobility Overal bed mobility: Needs Assistance Bed Mobility: Supine to Sit, Sit to Supine, Rolling Rolling: Mod assist   Supine to sit: Mod assist, HOB elevated Sit to supine: Mod assist, HOB elevated   General bed mobility comments: Pt initiating bringing legs towards and off R EOB, needing assistance to elevate trunk and to scoot each hip towards EOB with use of bed pad and step-by-step cues to scoot each hip anteriorly. ModA to lift legs and safely direct trunk to midline in bed. ModA to rotate hips to roll partially each way for bed pad management.    Transfers Overall transfer level: Needs assistance Equipment used: 1 person hand held assist Transfers: Sit to/from Stand Sit to Stand: Max assist          Lateral/Scoot Transfers: Min assist, Mod assist General transfer comment: Pt requesting to pull up on therapist's trunk to stand rather than utilize a RW today. Pt needed maxA using bed pads to boost her up to stand, only achieving ~2/3 full stand before pt sitting self back down due to buttocks pain. Pt declining  or displaying poor initiation with further attempts, thus transitioned to lateral scooting to R to get pt to [facility] to return her to supine, x > 5 reps, min-modA to shift hips while pt pushed through  UEs and LEs.    Ambulation/Gait               General Gait Details: unable this date   Stairs             Wheelchair Mobility     Tilt Bed    Modified Rankin (Stroke Patients Only) Modified Rankin (Stroke Patients Only) Pre-Morbid Rankin Score: Moderately severe disability Modified Rankin: Severe disability     Balance Overall balance assessment: Needs assistance Sitting-balance support: Feet supported, Single extremity supported, Bilateral upper extremity supported Sitting balance-Leahy Scale: Poor Sitting balance - Comments: Reliant on UE support and min guard-minA for static sitting balance this session due to lethargy, intermittent modA when pt's arms would give or she would lean posteriorly to try to lay back down. Postural control: Posterior lean Standing balance support: Bilateral upper extremity supported Standing balance-Leahy Scale: Zero Standing balance comment: Unable to come to full stand with maxA                            Cognition Arousal/Alertness: Lethargic Behavior During Therapy: Flat affect Overall Cognitive Status: Difficult to assess                                 General Comments: Pt very lethargic, keeping eyes closed majority of session. Received ativan around noon, so it had been ~5 hours since then now. Slow to process cues and poor initiation with standing mobility.        Exercises General Exercises - Lower Extremity Ankle Circles/Pumps: AROM, Both, 10 reps, Supine Long Arc Quad: AROM, Strengthening, Both, 10 reps, Seated Hip ABduction/ADduction: AROM, Both, 10 reps, Supine    General Comments General comments (skin integrity, edema, etc.): Vitals: 115/46 (68) & 49 bpm supine, 102/53 (61) & 57 bpm sitting, SBP 90s sitting after briefly standing (could not stand long enough to get BP), 91/64 (74) & 128 bpm supine after scooting along EOB, 104/39 & 49 bpm supine several minutes end of session; pt  reports feeling lightheaded throughout session, noted to be pale when returned to supine after scooting along EOB      Pertinent Vitals/Pain Pain Assessment Pain Assessment: Faces Faces Pain Scale: Hurts little more Pain Location: buttocks Pain Descriptors / Indicators: Grimacing, Guarding, Moaning Pain Intervention(s): Limited activity within patient's tolerance, Monitored during session, Repositioned    Home Living                          Prior Function            PT Goals (current goals can now be found in the care plan section) Acute Rehab PT Goals Patient Stated Goal: return home PT Goal Formulation: With patient Time For Goal Achievement: 09/21/22 Potential to Achieve Goals: Fair Progress towards PT goals: Not progressing toward goals - comment (limited by lethargy this date)    Frequency    Min 3X/week      PT Plan Equipment recommendations need to be updated    Co-evaluation              AM-PAC PT "6 Clicks" Mobility   Outcome  Measure  Help needed turning from your back to your side while in a flat bed without using bedrails?: A Lot Help needed moving from lying on your back to sitting on the side of a flat bed without using bedrails?: A Lot Help needed moving to and from a bed to a chair (including a wheelchair)?: A Lot Help needed standing up from a chair using your arms (e.g., wheelchair or bedside chair)?: A Lot Help needed to walk in hospital room?: Total Help needed climbing 3-5 steps with a railing? : Total 6 Click Score: 10    End of Session   Activity Tolerance: Patient limited by lethargy Patient left: in bed;with call bell/phone within reach;with bed alarm set Nurse Communication: Mobility status;Other (comment) (BP, pt working on chewing small piece of chicken) PT Visit Diagnosis: Unsteadiness on feet (R26.81);Muscle weakness (generalized) (M62.81);Difficulty in walking, not elsewhere classified (R26.2);Pain;Other  abnormalities of gait and mobility (R26.89) Pain - part of body:  (buttocks)     Time: 1610-9604 PT Time Calculation (min) (ACUTE ONLY): 29 min  Charges:    $Therapeutic Exercise: 8-22 mins $Therapeutic Activity: 8-22 mins PT General Charges $$ ACUTE PT VISIT: 1 Visit                     Raymond Gurney, PT, DPT Acute Rehabilitation Services  Office: [phone]    Jewel Baize 09/16/2022, 6:01 PM

## 2022-09-16 NOTE — Progress Notes (Signed)
   09/16/22 1159  Vitals  Temp 98.2 F (36.8 C)  Pulse Rate (!) 56  Resp 16  BP (!) 104/48  SpO2 94 %  O2 Device Room Air  Post Treatment  Dialyzer Clearance Clear  Duration of HD Treatment -hour(s) 2.48 hour(s)  Hemodialysis Intake (mL) 0 mL  Liters Processed 41.9  Fluid Removed (mL) 400 mL  Tolerated HD Treatment Yes  Post-Hemodialysis Comments Pt HD arterial line was beeping periodically, reversed the lines and flushed the system and the pt came off a little earlier than expect and UF goal was not met per order. Admin medication and Report called to 3W bedside RN Samer Harbawi.   Received patient in bed to unit.  Alert and oriented.  Informed consent signed and in chart.   TX duration:2:48  Patient tolerated well.  Transported back to the room  Alert, without acute distress.  Hand-off given to patient's nurse.   Access used: Yes Access issues: Yes, pt. Arterial line would not run, alarming at times, flushed the lines and reversed lines.  Total UF removed: 400 Medication(s) given: SEE MAR Post HD VS: See Above Grid Post HD weight: 82.0 kg   [facility]

## 2022-09-17 DIAGNOSIS — N179 Acute kidney failure, unspecified: Secondary | ICD-10-CM | POA: Diagnosis not present

## 2022-09-17 DIAGNOSIS — I4891 Unspecified atrial fibrillation: Secondary | ICD-10-CM

## 2022-09-17 DIAGNOSIS — N184 Chronic kidney disease, stage 4 (severe): Secondary | ICD-10-CM | POA: Diagnosis not present

## 2022-09-17 DIAGNOSIS — I5022 Chronic systolic (congestive) heart failure: Secondary | ICD-10-CM | POA: Diagnosis not present

## 2022-09-17 LAB — CBC
HCT: 24 % — ABNORMAL LOW (ref 36.0–46.0)
Hemoglobin: 7.5 g/dL — ABNORMAL LOW (ref 12.0–15.0)
MCH: 29.3 pg (ref 26.0–34.0)
MCHC: 31.3 g/dL (ref 30.0–36.0)
MCV: 93.8 fL (ref 80.0–100.0)
Platelets: 219 10*3/uL (ref 150–400)
RBC: 2.56 MIL/uL — ABNORMAL LOW (ref 3.87–5.11)
RDW: 20.9 % — ABNORMAL HIGH (ref 11.5–15.5)
WBC: 13.3 10*3/uL — ABNORMAL HIGH (ref 4.0–10.5)
nRBC: 1.2 % — ABNORMAL HIGH (ref 0.0–0.2)

## 2022-09-17 LAB — COMPREHENSIVE METABOLIC PANEL
ALT: 14 U/L (ref 0–44)
AST: 36 U/L (ref 15–41)
Albumin: <1.5 g/dL — ABNORMAL LOW (ref 3.5–5.0)
Alkaline Phosphatase: 64 U/L (ref 38–126)
Anion gap: 9 (ref 5–15)
BUN: 61 mg/dL — ABNORMAL HIGH (ref 8–23)
CO2: 25 mmol/L (ref 22–32)
Calcium: 7.9 mg/dL — ABNORMAL LOW (ref 8.9–10.3)
Chloride: 101 mmol/L (ref 98–111)
Creatinine, Ser: 2.75 mg/dL — ABNORMAL HIGH (ref 0.44–1.00)
GFR, Estimated: 17 mL/min — ABNORMAL LOW (ref >60)
Glucose, Bld: 155 mg/dL — ABNORMAL HIGH (ref 70–99)
Potassium: 4 mmol/L (ref 3.5–5.1)
Sodium: 135 mmol/L (ref 135–145)
Total Bilirubin: 0.8 mg/dL (ref 0.3–1.2)
Total Protein: 4.6 g/dL — ABNORMAL LOW (ref 6.5–8.1)

## 2022-09-17 LAB — GLUCOSE, CAPILLARY
Glucose-Capillary: 111 mg/dL — ABNORMAL HIGH (ref 70–99)
Glucose-Capillary: 158 mg/dL — ABNORMAL HIGH (ref 70–99)
Glucose-Capillary: 116 mg/dL — ABNORMAL HIGH (ref 70–99)
Glucose-Capillary: 112 mg/dL — ABNORMAL HIGH (ref 70–99)

## 2022-09-17 LAB — CULTURE, BLOOD (ROUTINE X 2)
Culture: NO GROWTH
Special Requests: ADEQUATE

## 2022-09-17 MED ORDER — INSULIN ASPART 100 UNIT/ML IJ SOLN
0.0000 [IU] | Freq: Three times a day (TID) | INTRAMUSCULAR | Status: DC
  Administered 2022-09-19: 1 [IU] via SUBCUTANEOUS
  Administered 2022-09-19: 3 [IU] via SUBCUTANEOUS
  Administered 2022-09-19: 2 [IU] via SUBCUTANEOUS
  Administered 2022-09-20: 3 [IU] via SUBCUTANEOUS
  Administered 2022-09-20: 1 [IU] via SUBCUTANEOUS
  Administered 2022-09-21: 2 [IU] via SUBCUTANEOUS
  Administered 2022-09-21 – 2022-09-22 (×2): 1 [IU] via SUBCUTANEOUS
  Administered 2022-09-22 (×2): 2 [IU] via SUBCUTANEOUS
  Administered 2022-09-23: 1 [IU] via SUBCUTANEOUS

## 2022-09-17 MED ORDER — PANTOPRAZOLE SODIUM 40 MG IV SOLR
40.0000 mg | INTRAVENOUS | Status: DC
  Administered 2022-09-17 – 2022-09-20 (×3): 40 mg via INTRAVENOUS
  Filled 2022-09-17 (×3): qty 10

## 2022-09-17 MED ORDER — OXYCODONE HCL 5 MG PO TABS
2.5000 mg | ORAL_TABLET | Freq: Two times a day (BID) | ORAL | Status: DC | PRN
  Administered 2022-09-17: 2.5 mg via ORAL
  Filled 2022-09-17: qty 1

## 2022-09-17 NOTE — Progress Notes (Signed)
[facility]                                                                               [name] [name], is a 82 y.o. female, DOB - [birth year], HTM:[id number] Admit date - 09/06/2022    Outpatient Primary MD for the patient is [name], MD  LOS - 11  days    Brief summary   [name] [name] is an 82 y.o. F with CKD IV baseline ~1.7-2.2, DM, sCHF EF 35-40%, hx HSV encephalitis 2014, pAF on Eliquis, seizures, hx ESBL UTI who presented with dizziness, found to have stroke, acute renal failure and ESBL bacteremia.    Significant events: 7/7: Admitted for stroke, on antibiotics/gentle IVF; Neuro consulted 7/8: Nephrology consulted; BCx growing Klebsiella 7/10: Cr worse, IVF restarted 7/14: Hgb down to 6, transfused 2 units 7/15: Vascath placed, underwent first HD, required 3rd unit PRBCs 7/16: Hgb again <7, required 4th unit PRBC 7/17: HD #2    Significant studies: 7/7: CXR -- perihilar opacities, c/w infection or edema 7/7: MRI brain/MRA H&N -- small R postcentral gyrus infarct, no significant vessel disease  7/8: US renal -- unchanged mild L hydro, 1.5 cm cyst   Significant microbiology data: 7/7 BCx x2: ESBL Klebsiella in 2/2 7/7 UCx: ESBL Klebsiella 7/8 BCx x2: ESBL in 1/3 (although this was drawn in very close proximity to meropenem) 7/13 BCx x2: MRSE in 1/3 (likely contaminant)    Procedures: 7/15: Vascath placed, first HD 7/17: HD#2   Consults: Neurology Nephrology Palliative care    Assessment & Plan    Assessment and Plan: * Acute renal failure superimposed on stage 4 chronic kidney disease (HCC)  Secondary to ischemic ATN int he setting of ESBL bacteremia, CHF, ckd. SPEP negative.  [place] without clinically significant disease. Nephrology consulted and plan for HD.     Acute CVA (cerebrovascular accident) (HCC) MRI brain showed right post-central gyrus infarct - Non-invasive angiography showed no significant disease - Echocardiogram  completed 1 month ago showed no cardiogenic source of embolism - Lipids ordered: LDL undetectable - Continue Lipitor, Zetia - Eliquis held for guaiac positive stools.  - Afib known, stable on Eliquis and amiodarone - tPA not given because outside window - Dysphagia screen completed - PT eval ordered: recommend SNF  ESBL Bacteremia due to UTI Repeat blood cultures from 7/8 growing ESBL in 1 of 3, however this was drawn within minutes of first dose of meropenem, so I do not think change of plan is necessary  Completed the course of Meropenem.  Repeat blood cultures from 09/12/2022 show staph epidermidis coag neg, suspect a contaminant.  Leukocytosis improving.    Questionable Gi bleed? Guaiac positive stools. From 09/07/2022.  Hemoglobin dropped  to 5.8  few days ago from a baseline of 11 from 06/2022,  requiring 4 units of PRBC transfusion.  Currently hemoglobin stable around 7.5.  Spouse is not aware of colonoscopy or EGd's in the past.  Eliquis is on hold for questionable GI blood loss.  IV protonix added.  GI will be consulted.    Hyponatremia Resolved.     Hyperkalemia Resolved.    Chronic systolic CHF (congestive heart failure) (  HCC) Last LVEF of 35% on 08/2022.  Fluid management as per HD.   Anemia in chronic kidney disease (CKD) Hemoglobin 7.5. s/p 4units of prbc transfusion.  Transfuse to keep hemoglobin >7.  Started ESA 7/13  CKD (chronic kidney disease) stage 4, GFR 15-29 ml/min (HCC)    CAD S/P percutaneous coronary angioplasty Continue with eliquis. BB held for bradycardia.  Get EKG.    Insulin dependent DM:  CBG (last 3)  Recent Labs    09/16/22 2244 09/17/22 0652 09/17/22 1145  GLUCAP 107* 111* 158*   Continue with SSI and Novolog 70/30   Seizure disorder (HCC) No longer on AED  Paroxysmal atrial fibrillation (HCC) Rate controlled - Continue amiodarone, Eliquis held for occult positive blood.  Metoprolol held for bradycardia and borderline  bp parameters.   Essential hypertension  Bp parameters are borderline soft. Holding metoprolol.   Hyperlipidemia - Continue Lipitor and Zetia    In view of multiple medical issues, deconditioning and ongoing decline, palliative care consulted for goals of care.   Estimated body mass index is 20.61 kg/m as calculated from the following:   Height as of this encounter: 5\' 9"  (1.753 m).   Weight as of this encounter: 63.3 kg.  Code Status: DNR DVT Prophylaxis:     Level of Care: Level of care: Med-Surg Family Communication: Updated patient's spouse on the phone.   Disposition Plan:     Remains inpatient appropriate:  IV PROTONIX, HD.    Antimicrobials:   Anti-infectives (From admission, onward)    Start     Dose/Rate Route Frequency Ordered Stop   09/06/22 1400  meropenem (MERREM) 1 g in sodium chloride 0.9 % 100 mL IVPB        1 g 200 mL/hr over 30 Minutes Intravenous Every 24 hours 09/06/22 1254 09/14/22 0559        Medications  Scheduled Meds:  amiodarone  200 mg Oral Daily   atorvastatin  40 mg Oral Daily   Chlorhexidine Gluconate Cloth  6 each Topical Q0600   darbepoetin (ARANESP) injection - NON-DIALYSIS  60 mcg Subcutaneous Q Sat-1800   ezetimibe  10 mg Oral Daily   feeding supplement (NEPRO CARB STEADY)  237 mL Oral Q24H   heparin sodium (porcine)  10 mL Intracatheter Once   hydrocortisone cream   Topical TID   insulin aspart  0-15 Units Subcutaneous TID WC   insulin aspart  0-5 Units Subcutaneous QHS   insulin aspart protamine- aspart  10 Units Subcutaneous BID WC   polyethylene glycol  17 g Oral Daily   sodium bicarbonate  650 mg Oral BID   witch hazel-glycerin   Topical TID   Continuous Infusions: PRN Meds:.acetaminophen, mouth rinse, oxyCODONE, senna-docusate    Subjective:   [name] [name] was seen and examined today. Restless. Slightly confused.  Objective:   Vitals:   09/17/22 0500 09/17/22 0851 09/17/22 1151 09/17/22 1200  BP:  (!)  109/46 (!) 112/44 (!) 113/48  Pulse:  71 (!) 48 (!) 50  Resp:  17 16 18   Temp:  (!) 97.3 F (36.3 C) (!) 96.3 F (35.7 C) 97.6 F (36.4 C)  TempSrc:  Oral Axillary Oral  SpO2:  97% 97% 98%  Weight: 63.3 kg     Height:       No intake or output data in the 24 hours ending 09/17/22 1412 Filed Weights   09/16/22 0811 09/16/22 1144 09/17/22 0500  Weight: 82.6 kg (S) 82 kg 63.3 kg  Exam General exam: frail elderly woman not in distress.  Respiratory system: Clear to auscultation. Respiratory effort normal. Cardiovascular system: S1 & S2 heard, RRR. No JVD, Gastrointestinal system: Abdomen is nondistended, soft and nontender.  Central nervous system: Alert and oriented to person only.  Extremities: no cyanosis  Skin: No rashes,  Psychiatry: restless     Data Reviewed:  I have personally reviewed following labs and imaging studies   CBC Lab Results  Component Value Date   WBC 13.3 (H) 09/17/2022   RBC 2.56 (L) 09/17/2022   HGB 7.5 (L) 09/17/2022   HCT 24.0 (L) 09/17/2022   MCV 93.8 09/17/2022   MCH 29.3 09/17/2022   PLT 219 09/17/2022   MCHC 31.3 09/17/2022   RDW 20.9 (H) 09/17/2022   LYMPHSABS 0.5 (L) 09/06/2022   MONOABS 0.5 09/06/2022   EOSABS 0.3 09/06/2022   BASOSABS 0.0 09/06/2022     Last metabolic panel Lab Results  Component Value Date   NA 135 09/17/2022   K 4.0 09/17/2022   CL 101 09/17/2022   CO2 25 09/17/2022   BUN 61 (H) 09/17/2022   CREATININE 2.75 (H) 09/17/2022   GLUCOSE 155 (H) 09/17/2022   GFRNONAA 17 (L) 09/17/2022   GFRAA 26 (L) 07/20/2018   CALCIUM 7.9 (L) 09/17/2022   PHOS 4.1 09/16/2022   PROT 4.6 (L) 09/17/2022   ALBUMIN <1.5 (L) 09/17/2022   LABGLOB 3.7 09/07/2022   AGRATIO 0.4 (L) 09/07/2022   BILITOT 0.8 09/17/2022   ALKPHOS 64 09/17/2022   AST 36 09/17/2022   ALT 14 09/17/2022   ANIONGAP 9 09/17/2022    CBG (last 3)  Recent Labs    09/16/22 2244 09/17/22 0652 09/17/22 1145  GLUCAP 107* 111* 158*       Coagulation Profile: No results for input(s): "INR", "PROTIME" in the last 168 hours.   Radiology Studies: No results found.     Kathlen Mody M.D. [facility] 09/17/2022, 2:12 PM  Available via Epic secure chat 7am-7pm After 7 pm, please refer to night coverage provider listed on amion.

## 2022-09-17 NOTE — Progress Notes (Signed)
Occupational Therapy Treatment Patient Details Name: [name] [name] MRN: [id number] DOB: 1940-07-20 Today's Date: 09/17/2022   History of present illness Pt is an 82 y/o female who presents 09/06/22 s/p syncope and fall while walking to the car as family was attempting to bring her to the ED for vomiting, weakness. Of note, pt with recent admission with d/c 07/10/22 from [facility] for UTI and hematura with elevated INR (discharged with foley). PMH significant for A fib, L eye blindness, DM II, HTN, osteopenia, pelvic fracture 2018, seizures, systolic CHF, ankle fx surgery 2017, IM nail R in 2014 and L 2017, I&D hip 2017, B shoulder surgery.   OT comments  Patient received in bed and asking to get OOB to have breakfast. Patient required increased time due to pain at neck at location of port. Patient required assistance of 2 to stand from EOB and transfer to recliner due to weakness and difficulty standing or stepping towards recliner. Patient performed grooming and was provided setup for breakfast with husband present to assist. Patient will benefit from continued inpatient follow up therapy, <3 hours/day to address self care and mobility. Acute OT to continue to follow.    Recommendations for follow up therapy are one component of a multi-disciplinary discharge planning process, led by the attending physician.  Recommendations may be updated based on patient status, additional functional criteria and insurance authorization.    Assistance Recommended at Discharge Frequent or constant Supervision/Assistance  Patient can return home with the following  A lot of help with bathing/dressing/bathroom;A lot of help with walking and/or transfers   Equipment Recommendations  Hospital bed;Wheelchair cushion (measurements OT);Wheelchair (measurements OT);BSC/3in1    Recommendations for Other Services      Precautions / Restrictions Precautions Precautions: Fall Precaution Comments: watch BP (orthostatic  7/17); L eye blindness Restrictions Weight Bearing Restrictions: No       Mobility Bed Mobility Overal bed mobility: Needs Assistance Bed Mobility: Supine to Sit     Supine to sit: Mod assist, HOB elevated     General bed mobility comments: assistance with raising trunk due to pain at neck    Transfers Overall transfer level: Needs assistance Equipment used: 2 person hand held assist Transfers: Sit to/from Stand, Bed to chair/wheelchair/BSC Sit to Stand: Mod assist, +2 physical assistance     Step pivot transfers: Mod assist, +2 physical assistance     General transfer comment: mod assist of 2 to stand and transfer to recliner due to pain and weakness     Balance Overall balance assessment: Needs assistance Sitting-balance support: Single extremity supported, Feet supported Sitting balance-Leahy Scale: Poor Sitting balance - Comments: min guard for sitting balance on EOB Postural control: Posterior lean Standing balance support: Bilateral upper extremity supported Standing balance-Leahy Scale: Poor                             ADL either performed or assessed with clinical judgement   ADL Overall ADL's : Needs assistance/impaired Eating/Feeding: Set up;Sitting Eating/Feeding Details (indicate cue type and reason): cues for item locations on plate Grooming: Wash/dry hands;Wash/dry face;Supervision/safety;Sitting Grooming Details (indicate cue type and reason): in recliner                                    Extremity/Trunk Assessment              Vision  Perception     Praxis      Cognition Arousal/Alertness: Awake/alert Behavior During Therapy: Flat affect Overall Cognitive Status: Difficult to assess                                 General Comments: Patient complaining of port, not being comfortable        Exercises      Shoulder Instructions       General Comments      Pertinent Vitals/  Pain       Pain Assessment Pain Assessment: Faces Faces Pain Scale: Hurts little more Pain Location: buttocks neck, right side Pain Descriptors / Indicators: Grimacing, Guarding, Moaning Pain Intervention(s): Limited activity within patient's tolerance, Monitored during session, Repositioned  Home Living                                          Prior Functioning/Environment              Frequency  Min 2X/week        Progress Toward Goals  OT Goals(current goals can now be found in the care plan section)  Progress towards OT goals: Progressing toward goals  Acute Rehab OT Goals Patient Stated Goal: feel better OT Goal Formulation: With patient Time For Goal Achievement: 09/21/22 Potential to Achieve Goals: Fair ADL Goals Pt Will Perform Eating: with modified independence;sitting;bed level Pt Will Perform Grooming: with set-up;sitting Pt Will Transfer to Toilet: with min assist;stand pivot transfer;bedside commode Additional ADL Goal #1: Pt to complete bed mobility with min guard in prep for ADLs  Plan Discharge plan remains appropriate    Co-evaluation                 AM-PAC OT "6 Clicks" Daily Activity     Outcome Measure   Help from another person eating meals?: A Little Help from another person taking care of personal grooming?: A Little Help from another person toileting, which includes using toliet, bedpan, or urinal?: Total Help from another person bathing (including washing, rinsing, drying)?: A Lot Help from another person to put on and taking off regular upper body clothing?: A Lot Help from another person to put on and taking off regular lower body clothing?: Total 6 Click Score: 12    End of Session Equipment Utilized During Treatment: Gait belt  OT Visit Diagnosis: Unsteadiness on feet (R26.81);Other abnormalities of gait and mobility (R26.89);Muscle weakness (generalized) (M62.81)   Activity Tolerance Patient limited by  pain   Patient Left in chair;with call bell/phone within reach;with chair alarm set;with family/visitor present   Nurse Communication Mobility status        Time: 7829-5621 OT Time Calculation (min): 24 min  Charges: OT General Charges $OT Visit: 1 Visit OT Treatments $Self Care/Home Management : 8-22 mins $Therapeutic Activity: 8-22 mins  Alfonse Flavors, OTA Acute Rehabilitation Services  Office [phone]   Dewain Penning 09/17/2022, 9:56 AM

## 2022-09-17 NOTE — Progress Notes (Signed)
Speech Language Pathology Treatment: Cognitive-Linquistic  Patient Details Name: [name] [name] MRN: [id number] DOB: 1940-11-23 Today's Date: 09/17/2022 Time: 5784-6962 SLP Time Calculation (min) (ACUTE ONLY): 16 min  Assessment / Plan / Recommendation Clinical Impression  Pt seen today for cognitive treatment targeting attention and problem solving through a card sorting task. Pt accurately sorted cards by color ~85% of the time independently. She required Min verbal cueing to attend to the activity instead of the muted TV. While sorting the last five cards, pt began attempting to sort cards by number and required redirection to return to the original task. She repeatedly retells a story about her career and her eyesight during every session, despite attempts to redirect. SLP will continue to f/u for functional activities as able.    HPI HPI: Pt is an 82 yo female presenting from home to ED with weakness, dizziness, difficulty ambulating x 2 months, and suspected recurrent ESBL UTI. MRI Brain revealed small acute infarct in R postcentral gyrus and large area of L temporal encephalomalacia correlating with history of remote herpes encephalitis. PMH includes afib, CKD stage III, insulin-dependent DM2, CHF with reduced EF, HTN, HLD      SLP Plan  Continue with current plan of care      Recommendations for follow up therapy are one component of a multi-disciplinary discharge planning process, led by the attending physician.  Recommendations may be updated based on patient status, additional functional criteria and insurance authorization.    Recommendations                     Oral care BID   Frequent or constant Supervision/Assistance Cognitive communication deficit (X52.841)     Continue with current plan of care     Gwynneth Aliment, M.A., CF-SLP Speech Language Pathology, Acute Rehabilitation Services  Secure Chat preferred [phone]   09/17/2022, 1:41 PM

## 2022-09-17 NOTE — Plan of Care (Signed)
  Problem: Education: Goal: Ability to describe self-care measures that may prevent or decrease complications (Diabetes Survival Skills Education) will improve Outcome: Progressing Goal: Individualized Educational Video(s) Outcome: Progressing   Problem: Coping: Goal: Ability to adjust to condition or change in health will improve Outcome: Progressing   Problem: Fluid Volume: Goal: Ability to maintain a balanced intake and output will improve Outcome: Progressing   Problem: Health Behavior/Discharge Planning: Goal: Ability to identify and utilize available resources and services will improve Outcome: Progressing Goal: Ability to manage health-related needs will improve Outcome: Progressing   Problem: Metabolic: Goal: Ability to maintain appropriate glucose levels will improve Outcome: Progressing   Problem: Nutritional: Goal: Maintenance of adequate nutrition will improve Outcome: Progressing Goal: Progress toward achieving an optimal weight will improve Outcome: Progressing   Problem: Skin Integrity: Goal: Risk for impaired skin integrity will decrease Outcome: Progressing   Problem: Tissue Perfusion: Goal: Adequacy of tissue perfusion will improve Outcome: Progressing   Problem: Education: Goal: Knowledge of disease or condition will improve Outcome: Progressing Goal: Knowledge of secondary prevention will improve (MUST DOCUMENT ALL) Outcome: Progressing Goal: Knowledge of patient specific risk factors will improve (Mark N/A or DELETE if not current risk factor) Outcome: Progressing   Problem: Ischemic Stroke/TIA Tissue Perfusion: Goal: Complications of ischemic stroke/TIA will be minimized Outcome: Progressing   Problem: Coping: Goal: Will verbalize positive feelings about self Outcome: Progressing Goal: Will identify appropriate support needs Outcome: Progressing   Problem: Health Behavior/Discharge Planning: Goal: Ability to manage health-related needs  will improve Outcome: Progressing Goal: Goals will be collaboratively established with patient/family Outcome: Progressing   Problem: Self-Care: Goal: Ability to participate in self-care as condition permits will improve Outcome: Progressing Goal: Verbalization of feelings and concerns over difficulty with self-care will improve Outcome: Progressing Goal: Ability to communicate needs accurately will improve Outcome: Progressing   Problem: Nutrition: Goal: Risk of aspiration will decrease Outcome: Progressing Goal: Dietary intake will improve Outcome: Progressing   Problem: Education: Goal: Knowledge of General Education information will improve Description: Including pain rating scale, medication(s)/side effects and non-pharmacologic comfort measures Outcome: Progressing   Problem: Health Behavior/Discharge Planning: Goal: Ability to manage health-related needs will improve Outcome: Progressing   Problem: Clinical Measurements: Goal: Ability to maintain clinical measurements within normal limits will improve Outcome: Progressing Goal: Will remain free from infection Outcome: Progressing Goal: Diagnostic test results will improve Outcome: Progressing Goal: Respiratory complications will improve Outcome: Progressing Goal: Cardiovascular complication will be avoided Outcome: Progressing   Problem: Activity: Goal: Risk for activity intolerance will decrease Outcome: Progressing   Problem: Nutrition: Goal: Adequate nutrition will be maintained Outcome: Progressing   Problem: Coping: Goal: Level of anxiety will decrease Outcome: Progressing   Problem: Elimination: Goal: Will not experience complications related to bowel motility Outcome: Progressing Goal: Will not experience complications related to urinary retention Outcome: Progressing   Problem: Pain Managment: Goal: General experience of comfort will improve Outcome: Progressing   Problem: Safety: Goal:  Ability to remain free from injury will improve Outcome: Progressing   Problem: Skin Integrity: Goal: Risk for impaired skin integrity will decrease Outcome: Progressing   

## 2022-09-17 NOTE — Inpatient Diabetes Management (Signed)
Inpatient Diabetes Program Recommendations  AACE/ADA: New Consensus Statement on Inpatient Glycemic Control (2015)  Target Ranges:  Prepandial:   less than 140 mg/dL      Peak postprandial:   less than 180 mg/dL (1-2 hours)      Critically ill patients:  140 - 180 mg/dL   Lab Results  Component Value Date   GLUCAP 158 (H) 09/17/2022   HGBA1C 9.7 (H) 09/06/2022    Review of Glycemic Control  Latest Reference Range & Units 09/16/22 12:01 09/16/22 16:16 09/16/22 21:44 09/16/22 22:44 09/17/22 06:52 09/17/22 11:45  Glucose-Capillary 70 - 99 mg/dL 604 (H) 540 (H) 68 (L) 107 (H) 111 (H) 158 (H)  Diabetes history: DM 2 Outpatient Diabetes medications:  70/30 23 units q AM and 70/30 5 units q PM Current orders for Inpatient glycemic control:  Novolog moderate tid with meals and HS Novolog 70/30 mix 10 units bid  Inpatient Diabetes Program Recommendations:    May consider reducing Novolog correction to very sensitive (0-6 units) tid with meal.s   Thanks,  Beryl Meager, RN, BC-ADM Inpatient Diabetes Coordinator Pager [phone]  (8a-5p)

## 2022-09-17 NOTE — Progress Notes (Signed)
[facility] Progress Note   Subjective:    HD yesterday, agitated throughout requiring redirection 0.4L UOP Palliative following is DNR/DNI Frustrated with  care, discomfort. Doesn't like HD Explored GOC   Objective Vitals:   09/16/22 2017 09/17/22 0500 09/17/22 0851 09/17/22 1151  BP: (!) 116/49  (!) 109/46 (!) 112/44  Pulse: (!) 52  71 (!) 48  Resp: 17  17 16   Temp: (!) 97.3 F (36.3 C)  (!) 97.3 F (36.3 C) (!) 96.3 F (35.7 C)  TempSrc: Oral  Oral Axillary  SpO2:   97% 97%  Weight:  63.3 kg    Height:       Physical Exam Gen: sleepy, no distress Eyes: anicteric, EOMI ENT: MMM CV:  RRR, no rub  Abd: soft, nontender Back:CTAB GU: purewick with yellow urine in cannister  Extr: 1+ LE edema Neuro: arousable but sleepy, answering questions Skin: no rashes noted  Additional Objective Labs: Basic Metabolic Panel: Recent Labs  Lab 09/14/22 0728 09/15/22 0432 09/16/22 0717 09/17/22 0112  NA 132* 134* 136 135  K 5.2* 4.6 4.5 4.0  CL 103 101 105 101  CO2 21* 24 25 25   GLUCOSE 148* 149* 122* 155*  BUN 134* 100* 109* 61*  CREATININE 4.89* 3.58* 3.85* 2.75*  CALCIUM 8.1* 7.8* 8.2* 7.9*  PHOS 4.5 3.5 4.1  --    Liver Function Tests: Recent Labs  Lab 09/15/22 0432 09/16/22 0717 09/17/22 0112  AST  --   --  36  ALT  --   --  14  ALKPHOS  --   --  64  BILITOT  --   --  0.8  PROT  --   --  4.6*  ALBUMIN <1.5* <1.5* <1.5*   No results for input(s): "LIPASE", "AMYLASE" in the last 168 hours. CBC: Recent Labs  Lab 09/13/22 0341 09/13/22 1156 09/14/22 0728 09/15/22 0432 09/16/22 0717 09/17/22 0112  WBC 16.0*  --  17.5* 17.0* 15.7* 13.3*  HGB 5.8*   < > 6.4* 6.9* 7.5* 7.5*  HCT 17.6*   < > 19.4* 21.0* 22.9* 24.0*  MCV 87.1  --  86.2 87.9 89.1 93.8  PLT 361  --  303 264 237 219   < > = values in this interval not displayed.   Blood Culture    Component Value Date/Time   SDES BLOOD RIGHT HAND 09/12/2022 1434   SDES BLOOD LEFT HAND  09/12/2022 1434   SPECREQUEST  09/12/2022 1434    BOTTLES DRAWN AEROBIC AND ANAEROBIC Blood Culture adequate volume   SPECREQUEST  09/12/2022 1434    BOTTLES DRAWN AEROBIC AND ANAEROBIC Blood Culture results may not be optimal due to an excessive volume of blood received in culture bottles   CULT  09/12/2022 1434    NO GROWTH 4 DAYS Performed at [facility], [address] [place], [place] [address]    CULT (A) 09/12/2022 1434    STAPHYLOCOCCUS EPIDERMIDIS THE SIGNIFICANCE OF ISOLATING THIS ORGANISM FROM A SINGLE SET OF BLOOD CULTURES WHEN MULTIPLE SETS ARE DRAWN IS UNCERTAIN. PLEASE NOTIFY THE MICROBIOLOGY DEPARTMENT WITHIN ONE WEEK IF SPECIATION AND SENSITIVITIES ARE REQUIRED. Performed at  [facility], [address] [place], [place] [address]    REPTSTATUS PENDING 09/12/2022 1434   REPTSTATUS 09/15/2022 FINAL 09/12/2022 1434    Cardiac Enzymes: No results for input(s): "CKTOTAL", "CKMB", "CKMBINDEX", "TROPONINI" in the last 168 hours. CBG: Recent Labs  Lab 09/16/22 1616 09/16/22 2144 09/16/22 2244 09/17/22 0652 09/17/22 1145  GLUCAP 128* 68* 107* 111* 158*   Iron Studies:  No results for input(s): "IRON", "TIBC", "TRANSFERRIN", "FERRITIN" in the last 72 hours.  @lablastinr3 @ Studies/Results: No results found. Medications:    amiodarone  200 mg Oral Daily   atorvastatin  40 mg Oral Daily   Chlorhexidine Gluconate Cloth  6 each Topical Q0600   darbepoetin (ARANESP) injection - NON-DIALYSIS  60 mcg Subcutaneous Q Sat-1800   ezetimibe  10 mg Oral Daily   feeding supplement (NEPRO CARB STEADY)  237 mL Oral Q24H   heparin sodium (porcine)  10 mL Intracatheter Once   hydrocortisone cream   Topical TID   insulin aspart  0-15 Units Subcutaneous TID WC   insulin aspart  0-5 Units Subcutaneous QHS   insulin aspart protamine- aspart  10 Units Subcutaneous BID WC   metoprolol succinate  25 mg Oral Daily   polyethylene glycol  17 g Oral Daily   sodium  bicarbonate  650 mg Oral BID   witch hazel-glycerin   Topical TID    Assessment/PlanVirginia E [name] is an 82 y.o. female atrial fibrillation, DM type 2, HFrEF, HTN, HL,  currently admitted for ESBL E coli UTI and CVA for whom nephrology is consulted for evaluation and management of AKI on CKD.    **AKI on CKD:  baseline Cr was in mid 2s > 08/2022 admission 2.6 > 3.6 > 3 now presenting with Cr 4.55 > 4.6 > 4.7 > 4.8 > 5.1 > 4.9.  BUN increasing further.  Has uremic symptoms.  No nephrology care outpt.  UA consistent with UTI.  Imaging with no new findings - do not suspect mild hydro on L significantly contributing.  Modest hypotension noted - suspect ATN related to acute infection + hypovolemia.  S/p IV fluid challenge and kidney function continued to worsen and develooped uremic symptoms.   Temp R internal jugular HD cath placed 7/15 HD #1 7/15 Cont on MWF schedule for now, no heparin Start CLIP as AKI In process Will need to placed [facility] prior to DC, not done initially 2/2 infectious issues; will ask IR today   **acute CVA: post central gyrus, neurology following.  PT/OT have been consulted.    **Hyponatremia:  mild, monitoring; will  improve with HD  **Hyperkalemia: improved / resolved with HD   **Anemia, normocytic:  Baseline in the past few months 8-9s.  Dropped this AM from 7-8s to 5.8 with report of dark stools. Iron sat 11% - s/p  IV. FE.  Started ESA 7/13.  Transfusion per primary.    **HFrEF:  EF 35% 08/2022.  Start gentle UF with HD   **UTI: ESBL Klebsiella, sens only to imipenem. Finished ABX   **R renal hypoechoic structure:  rads rec 2mo f/u US ensure stable   **DM: insulin per primary.    **A fib:  on  BB, amiodarone.  Holding eliquis in light of poss GIB   Will follow, call with concerns.   Arita Miss, MD  09/17/2022, 12:04 PM  [facility]

## 2022-09-17 NOTE — Progress Notes (Addendum)
Contacted [facility] admissions to request an update on pt's referral. Awaiting a response.   Olivia Canter Renal Navigator [phone]  Addendum at 4:18 pm: Attempted several times to contact clinic to see if pt has been medically cleared and to confirm pt's schedule. Will need to f/u at a later time.

## 2022-09-18 ENCOUNTER — Inpatient Hospital Stay (HOSPITAL_COMMUNITY): Payer: Medicare Other

## 2022-09-18 DIAGNOSIS — N179 Acute kidney failure, unspecified: Secondary | ICD-10-CM | POA: Diagnosis not present

## 2022-09-18 DIAGNOSIS — N189 Chronic kidney disease, unspecified: Secondary | ICD-10-CM | POA: Diagnosis not present

## 2022-09-18 DIAGNOSIS — Z9189 Other specified personal risk factors, not elsewhere classified: Secondary | ICD-10-CM

## 2022-09-18 DIAGNOSIS — D649 Anemia, unspecified: Secondary | ICD-10-CM | POA: Diagnosis not present

## 2022-09-18 DIAGNOSIS — I5022 Chronic systolic (congestive) heart failure: Secondary | ICD-10-CM | POA: Diagnosis not present

## 2022-09-18 DIAGNOSIS — I4891 Unspecified atrial fibrillation: Secondary | ICD-10-CM | POA: Diagnosis not present

## 2022-09-18 DIAGNOSIS — K5900 Constipation, unspecified: Secondary | ICD-10-CM

## 2022-09-18 DIAGNOSIS — N184 Chronic kidney disease, stage 4 (severe): Secondary | ICD-10-CM | POA: Diagnosis not present

## 2022-09-18 DIAGNOSIS — Z7901 Long term (current) use of anticoagulants: Secondary | ICD-10-CM | POA: Diagnosis not present

## 2022-09-18 HISTORY — PX: IR FLUORO GUIDE CV LINE RIGHT: IMG2283

## 2022-09-18 LAB — GLUCOSE, CAPILLARY
Glucose-Capillary: 60 mg/dL — ABNORMAL LOW (ref 70–99)
Glucose-Capillary: 98 mg/dL (ref 70–99)
Glucose-Capillary: 99 mg/dL (ref 70–99)
Glucose-Capillary: 88 mg/dL (ref 70–99)

## 2022-09-18 LAB — HEMOGLOBIN AND HEMATOCRIT, BLOOD
HCT: 26.5 % — ABNORMAL LOW (ref 36.0–46.0)
Hemoglobin: 8.2 g/dL — ABNORMAL LOW (ref 12.0–15.0)

## 2022-09-18 MED ORDER — CHLORHEXIDINE GLUCONATE 4 % EX SOLN
CUTANEOUS | Status: AC
  Filled 2022-09-18: qty 15

## 2022-09-18 MED ORDER — LACTATED RINGERS IV BOLUS
250.0000 mL | Freq: Once | INTRAVENOUS | Status: AC
  Administered 2022-09-18: 250 mL via INTRAVENOUS

## 2022-09-18 MED ORDER — CEFAZOLIN SODIUM-DEXTROSE 2-4 GM/100ML-% IV SOLN
INTRAVENOUS | Status: AC
  Filled 2022-09-18: qty 100

## 2022-09-18 MED ORDER — FENTANYL CITRATE (PF) 100 MCG/2ML IJ SOLN
INTRAMUSCULAR | Status: AC | PRN
  Administered 2022-09-18: 25 ug via INTRAVENOUS
  Administered 2022-09-18: 50 ug via INTRAVENOUS

## 2022-09-18 MED ORDER — CEFAZOLIN SODIUM-DEXTROSE 2-4 GM/100ML-% IV SOLN
2.0000 g | INTRAVENOUS | Status: AC
  Administered 2022-09-18: 2 g via INTRAVENOUS

## 2022-09-18 MED ORDER — MIDAZOLAM HCL 2 MG/2ML IJ SOLN
INTRAMUSCULAR | Status: AC | PRN
  Administered 2022-09-18: 1 mg via INTRAVENOUS

## 2022-09-18 MED ORDER — HEPARIN SODIUM (PORCINE) 1000 UNIT/ML IJ SOLN
INTRAMUSCULAR | Status: AC
  Filled 2022-09-18: qty 4

## 2022-09-18 MED ORDER — HEPARIN SODIUM (PORCINE) 1000 UNIT/ML IJ SOLN
INTRAMUSCULAR | Status: AC
  Filled 2022-09-18: qty 10

## 2022-09-18 MED ORDER — DEXTROSE 50 % IV SOLN
12.5000 g | INTRAVENOUS | Status: AC
  Administered 2022-09-18: 12.5 g via INTRAVENOUS

## 2022-09-18 MED ORDER — FENTANYL CITRATE (PF) 100 MCG/2ML IJ SOLN
INTRAMUSCULAR | Status: AC
  Filled 2022-09-18: qty 2

## 2022-09-18 MED ORDER — LIDOCAINE-EPINEPHRINE 1 %-1:100000 IJ SOLN
INTRAMUSCULAR | Status: AC
  Filled 2022-09-18: qty 1

## 2022-09-18 MED ORDER — DEXTROSE 50 % IV SOLN
INTRAVENOUS | Status: AC
  Filled 2022-09-18: qty 50

## 2022-09-18 MED ORDER — MIDAZOLAM HCL 2 MG/2ML IJ SOLN
INTRAMUSCULAR | Status: AC
  Filled 2022-09-18: qty 2

## 2022-09-18 NOTE — Plan of Care (Signed)
  Problem: Education: Goal: Ability to describe self-care measures that may prevent or decrease complications (Diabetes Survival Skills Education) will improve Outcome: Progressing Goal: Individualized Educational Video(s) Outcome: Progressing   Problem: Coping: Goal: Ability to adjust to condition or change in health will improve Outcome: Progressing   Problem: Fluid Volume: Goal: Ability to maintain a balanced intake and output will improve Outcome: Progressing   Problem: Health Behavior/Discharge Planning: Goal: Ability to identify and utilize available resources and services will improve Outcome: Progressing Goal: Ability to manage health-related needs will improve Outcome: Progressing   Problem: Metabolic: Goal: Ability to maintain appropriate glucose levels will improve Outcome: Progressing   Problem: Nutritional: Goal: Maintenance of adequate nutrition will improve Outcome: Progressing Goal: Progress toward achieving an optimal weight will improve Outcome: Progressing   Problem: Skin Integrity: Goal: Risk for impaired skin integrity will decrease Outcome: Progressing   Problem: Tissue Perfusion: Goal: Adequacy of tissue perfusion will improve Outcome: Progressing   Problem: Education: Goal: Knowledge of disease or condition will improve Outcome: Progressing Goal: Knowledge of secondary prevention will improve (MUST DOCUMENT ALL) Outcome: Progressing Goal: Knowledge of patient specific risk factors will improve (Mark N/A or DELETE if not current risk factor) Outcome: Progressing   Problem: Ischemic Stroke/TIA Tissue Perfusion: Goal: Complications of ischemic stroke/TIA will be minimized Outcome: Progressing   Problem: Coping: Goal: Will verbalize positive feelings about self Outcome: Progressing Goal: Will identify appropriate support needs Outcome: Progressing   Problem: Health Behavior/Discharge Planning: Goal: Ability to manage health-related needs  will improve Outcome: Progressing Goal: Goals will be collaboratively established with patient/family Outcome: Progressing   Problem: Self-Care: Goal: Ability to participate in self-care as condition permits will improve Outcome: Progressing Goal: Verbalization of feelings and concerns over difficulty with self-care will improve Outcome: Progressing Goal: Ability to communicate needs accurately will improve Outcome: Progressing   Problem: Nutrition: Goal: Risk of aspiration will decrease Outcome: Progressing Goal: Dietary intake will improve Outcome: Progressing   Problem: Education: Goal: Knowledge of General Education information will improve Description: Including pain rating scale, medication(s)/side effects and non-pharmacologic comfort measures Outcome: Progressing   Problem: Health Behavior/Discharge Planning: Goal: Ability to manage health-related needs will improve Outcome: Progressing   Problem: Clinical Measurements: Goal: Ability to maintain clinical measurements within normal limits will improve Outcome: Progressing Goal: Will remain free from infection Outcome: Progressing Goal: Diagnostic test results will improve Outcome: Progressing Goal: Respiratory complications will improve Outcome: Progressing Goal: Cardiovascular complication will be avoided Outcome: Progressing   Problem: Activity: Goal: Risk for activity intolerance will decrease Outcome: Progressing   Problem: Nutrition: Goal: Adequate nutrition will be maintained Outcome: Progressing   Problem: Coping: Goal: Level of anxiety will decrease Outcome: Progressing   Problem: Elimination: Goal: Will not experience complications related to bowel motility Outcome: Progressing Goal: Will not experience complications related to urinary retention Outcome: Progressing   Problem: Pain Managment: Goal: General experience of comfort will improve Outcome: Progressing   Problem: Safety: Goal:  Ability to remain free from injury will improve Outcome: Progressing   Problem: Skin Integrity: Goal: Risk for impaired skin integrity will decrease Outcome: Progressing   

## 2022-09-18 NOTE — Consult Note (Addendum)
Consultation Note   Referring Provider:  [facility] PCP: [name], MD Primary Gastroenterologist: None, has  PCP        Reason for consultation: anemia / heme + / anticoagulated  DOA: 09/06/2022         Hospital Day: 13   Attending physician's note  I have taken a history, reviewed the chart and examined the patient. I performed a substantive portion of this encounter, including complete performance of at least one of the key components, in conjunction with the APP. I agree with the APP's note, impression and recommendations.    82 year old female with multiple comorbidities, A-fib on Eliquis, acute CVA, ESBL E. coli UTI, AKI on CKD with worsening anemia  No overt GI bleed, FOBT positive.  Not iron deficient Worsening anemia of chronic disease Patient is high risk for endoscopic evaluation to undergo sedation with anesthesia.  Will defer endoscopic evaluation unless needed for therapeutic intervention with signs of GI hemorrhage  Continue supportive care GI is available if needed, please call with questions GI will sign off   The patient was provided an opportunity to ask questions and all were answered. The patient agreed with the plan and demonstrated an understanding of the instructions.  Iona Beard , MD [phone]     Assessment and Plan   82 yo female with a history of A-Fib recently transitioned to Eliquis in outpatient setting. Admitted several days ago with acute CVA,  ESBL E coli UTI,  AKI on CKD and worsening anemia.   Acute on chronic anemia, FOBT+ on Eliquis. Doesn't appear iron deficient by labs. Only GI symptom is constipation which is probably multifactorial ( meds, illness, decreased mobility). In setting of renal disease she could have AVMs. Neoplasm not excluded.  -Patient is at increased risk for endoscopic evaluation given co-morbidities, recent CVA. However, given need for chronic  anticoagulation will need to weight the risks and benefits of proceeding.   Constipation, Acute? -Continue daily Miralax -Change Senokot to daily dosing  AKI on CKD. To start HD Monday   See PMH for additional medical history    Pertinent GI History:  none   HISTORY OF PRESENT ILLNESS  Patient is a 82 y.o. year old female with a past medical history of obesity, DM2, CAD, A-fib, HTN, chronic systolic CHF, chronic anemia.   Hospitalization has been c/b worsening of chronic anemia. Baseline seems to be ~ hgb 8-10, it was 8 on admission with subsequent decline to 7 requiring transfusion. Hgb improved but declined again a few days later to 5.8.She has required several units of PRBCs this admission. Hgb has stabilized at 7.5.  FOBT+. Doesn't appear by labs to be iron deficient. She doesn't know if she has had any blood in her stools, She has no abdominal pain. She has no N\V. She does given a history of recent development of constipation with decreased frequency orf BM. No prior colonoscopy.   Labs and Imaging: Recent Labs    09/16/22 0717 09/17/22 0112  WBC 15.7* 13.3*  HGB 7.5* 7.5*  HCT 22.9* 24.0*  PLT 237 219   Recent Labs    09/16/22 0717 09/17/22 0112  NA 136 135  K 4.5 4.0  CL 105 101  CO2 25 25  GLUCOSE 122* 155*  BUN 109* 61*  CREATININE 3.85* 2.75*  CALCIUM 8.2* 7.9*   Recent Labs    09/17/22 0112  PROT 4.6*  ALBUMIN <1.5*  AST 36  ALT 14  ALKPHOS 64  BILITOT 0.8   No results for input(s): "HEPBSAG", "HCVAB", "HEPAIGM", "HEPBIGM" in the last 72 hours. No results for input(s): "LABPROT", "INR" in the last 72 hours.  Previous GI Evaluation:      Principal Problem:   Acute renal failure superimposed on stage 4 chronic kidney disease (HCC) Active Problems:   Hyperlipidemia   Essential hypertension   Paroxysmal atrial fibrillation (HCC)   Hyponatremia   Seizure disorder (HCC)   Diabetes (HCC)   CAD S/P percutaneous coronary angioplasty   CKD  (chronic kidney disease) stage 4, GFR 15-29 ml/min (HCC)   Acute CVA (cerebrovascular accident) (HCC)   ESBL Bacteremia due to UTI   Anemia in chronic kidney disease (CKD)   Chronic systolic CHF (congestive heart failure) (HCC)   Hyperkalemia     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   HYPERLIPIDEMIA 03/27/2006   HYPERTENSION 03/27/2006   Intertrochanteric fracture of right hip (HCC) 07/13/2012   LUNG NODULE 09/01/2006   Excision, Bx Benighn   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"   Osteopenia 2004   Dexa 2004 showed Osteopenia, DEXA 03/2007 normal   Osteopenia    Other chronic cystitis with hematuria    Pelvic fracture (HCC) 06/16/2016   S/P fall   Recurrent urinary tract infection    Seeing Urology   RETINOPATHY, BACKGROUND NOS 03/27/2006   Seizures (HCC)    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    Past Surgical History:  Procedure Laterality Date   ANKLE FRACTURE SURGERY Bilateral 08/2015   "right one was in 2 places"   CARDIAC CATHETERIZATION N/A 05/17/2015   Procedure: Left Heart Cath and Coronary Angiography;  Surgeon: Corky Crafts, MD;  Location: [facility] INVASIVE CV LAB;  Service: Cardiovascular;  Laterality: N/A;   CARDIAC CATHETERIZATION N/A 05/17/2015   Procedure: Coronary Balloon Angioplasty;  Surgeon: Corky Crafts, MD;  Location: [facility] INVASIVE CV LAB;  Service: Cardiovascular;  Laterality: N/A;   CARDIOVERSION N/A 08/05/2022   Procedure: CARDIOVERSION;  Surgeon: Chilton Si, MD;  Location: [facility] INVASIVE  CV LAB;  Service: Cardiovascular;  Laterality: N/A;   FEMUR IM NAIL Right 07/15/2012   Procedure: INTRAMEDULLARY (IM) NAIL HIP;  Surgeon: Eulas Post, MD;  Location: [facility] OR;  Service: Orthopedics;  Laterality: Right;   FEMUR IM NAIL Left 12/02/2015   Procedure: INTRAMEDULLARY (IM) NAIL FEMORAL;  Surgeon: Eldred Manges, MD;  Location: [facility] OR;  Service: Orthopedics;  Laterality: Left;   FRACTURE SURGERY     INCISION AND DRAINAGE HIP Left 01/03/2016   Procedure: IRRIGATION AND DEBRIDEMENT HIP;  Surgeon: Eldred Manges, MD;  Location: [facility] OR;  Service: Orthopedics;  Laterality: Left;   IR FLUORO GUIDE CV LINE RIGHT  09/14/2022  IR US GUIDE VASC ACCESS RIGHT  09/14/2022   SHOULDER SURGERY Bilateral    "don't remember which side"    Family History  Problem Relation Age of Onset   Diabetes Father    Heart attack Father 51   Diabetes Sister    Drug abuse Son    Cancer Neg Hx        no hx of colon or breast cancer    Prior to Admission medications   Medication Sig Start Date End Date Taking? Authorizing Provider  alendronate (FOSAMAX) 35 MG tablet Take 35 mg by mouth every 7 (seven) days. Take with a full glass of water on an empty stomach.   Yes [provider]  amiodarone (PACERONE) 200 MG tablet Take 1 tablet (200 mg total) by mouth daily. 08/13/22  Yes Zannie Cove, MD  apixaban (ELIQUIS) 2.5 MG TABS tablet Take 1 tablet (2.5 mg total) by mouth 2 (two) times daily. To start likely on Friday 6/14 if recommended by Coumadin clinic after labs done 08/21/22  Yes Chilton Si, MD  atorvastatin (LIPITOR) 80 MG tablet Take 1 tablet (80 mg total) by mouth daily. 08/13/22  Yes [name], Nolon Rod, MD  ezetimibe (ZETIA) 10 MG tablet Take 1 tablet (10 mg total) by mouth daily. 12/04/21  Yes Chilton Si, MD  hydrocortisone 2.5 % cream Apply topically 2 (two) times daily. 08/31/22  Yes Wendling, Jilda Roche, DO  insulin NPH-regular Human (NOVOLIN 70/30 RELION) (70-30) 100 UNIT/ML injection 10  units in the morning and 5 units at night. Patient taking differently: Inject 5-23 Units into the skin daily. 07/23/22  Yes Dahal, Melina Schools, MD  metoprolol succinate (TOPROL-XL) 50 MG 24 hr tablet TAKE 1 TABLET BY MOUTH ONCE DAILY . APPOINTMENT REQUIRED FOR FUTURE REFILLS Patient taking differently: Take 50 mg by mouth daily. 06/15/22  Yes Chilton Si, MD  Multiple Vitamins-Minerals (MULTIVITAMIN ADULT PO) Take 1 tablet by mouth daily with breakfast.    Yes [provider]  VITAMIN D, CHOLECALCIFEROL, PO Take 1 tablet by mouth daily.   Yes [provider]  furosemide (LASIX) 40 MG tablet Take 1 tablet (40 mg total) by mouth daily as needed for fluid or edema. Patient not taking: Reported on 09/06/2022 07/23/22   Lorin Glass, MD  Nitroglycerin 0.4 % OINT Apply 1 ribbon of ointment anally every 12 hours for anal pain. Patient not taking: Reported on 09/06/2022 08/31/22   Sharlene Dory, DO    Current Facility-Administered Medications  Medication Dose Route Frequency Provider Last Rate Last Admin   acetaminophen (TYLENOL) tablet 650 mg  650 mg Oral Q6H PRN Alberteen Sam, MD   650 mg at 09/17/22 0957   amiodarone (PACERONE) tablet 200 mg  200 mg Oral Daily Kirby Crigler, Mir M, MD   200 mg at 09/18/22 1000   atorvastatin (LIPITOR) tablet 40 mg  40 mg Oral Daily Marvel Plan, MD   40 mg at 09/18/22 1000   Chlorhexidine Gluconate Cloth 2 % PADS 6 each  6 each Topical Q0600 Tyler Pita, MD   6 each at 09/18/22 1001   Darbepoetin Alfa (ARANESP) injection 60 mcg  60 mcg Subcutaneous Q Sat-1800 Tyler Pita, MD   60 mcg at 09/12/22 1736   ezetimibe (ZETIA) tablet 10 mg  10 mg Oral Daily Kirby Crigler, Mir M, MD   10 mg at 09/18/22 1000   feeding supplement (NEPRO CARB STEADY) liquid 237 mL  237 mL Oral Q24H Danford, Earl Lites, MD   237  mL at 09/17/22 1814   heparin sodium (porcine) injection 10,000 Units  10 mL Intracatheter Once Oley Balm, MD        hydrocortisone cream 1 %   Topical TID Rai, Ripudeep K, MD   Given at 09/18/22 1002   insulin aspart (novoLOG) injection 0-6 Units  0-6 Units Subcutaneous TID WC Kathlen Mody, MD       Oral care mouth rinse  15 mL Mouth Rinse PRN Kirby Crigler, Mir M, MD       oxyCODONE (Oxy IR/ROXICODONE) immediate release tablet 2.5 mg  2.5 mg Oral Q12H PRN Kathlen Mody, MD   2.5 mg at 09/17/22 1300   pantoprazole (PROTONIX) injection 40 mg  40 mg Intravenous Q24H Kathlen Mody, MD   40 mg at 09/17/22 1816   polyethylene glycol (MIRALAX / GLYCOLAX) packet 17 g  17 g Oral Daily Danford, Earl Lites, MD   17 g at 09/18/22 1003   senna-docusate (Senokot-S) tablet 1 tablet  1 tablet Oral BID PRN Alberteen Sam, MD       sodium bicarbonate tablet 650 mg  650 mg Oral BID Tyler Pita, MD   650 mg at 09/18/22 1000   witch hazel-glycerin (TUCKS) pad   Topical TID Cathren Harsh, MD   Given at 09/18/22 1003    Allergies as of 09/06/2022 - Review Complete 09/06/2022  Allergen Reaction Noted   Procaine hcl Anaphylaxis 11/13/2004   Amoxicillin Itching 03/23/2016    Social History   Socioeconomic History   Marital status: Married    Spouse name: [name]   Number of children: 1   Years of education: [employer] education level: Not on file  Occupational History   Occupation: retired     Associate Professor: RETIRED  Tobacco Use   Smoking status: Former    Current packs/day: 0.00    Types: Cigarettes    Quit date: 03/02/1978    Years since quitting: 44.5   Smokeless tobacco: Never  Vaping Use   Vaping status: Never Used  Substance and Sexual Activity   Alcohol use: No    Alcohol/week: 0.0 standard drinks of alcohol   Drug use: No   Sexual activity: Not on file  Other Topics Concern   Not on file  Social History Narrative   Patient lives at home spouse. Uses a walker consistently.   Moved from [place] 2004   1 son, problems w/ drugs, passed away 2016/02/27-- OD       Social Determinants of Health    Financial Resource Strain: Low Risk  (08/22/2020)   Overall Financial Resource Strain (CARDIA)    Difficulty of Paying Living Expenses: Not hard at all  Food Insecurity: No Food Insecurity (09/06/2022)   Hunger Vital Sign    Worried About Running Out of Food in the Last Year: Never true    Ran Out of Food in the Last Year: Never true  Transportation Needs: No Transportation Needs (09/06/2022)   PRAPARE - Administrator, Civil Service (Medical): No    Lack of Transportation (Non-Medical): No  Physical Activity: Inactive (08/22/2020)   Exercise Vital Sign    Days of Exercise per Week: 0 days    Minutes of Exercise per Session: 0 min  Stress: No Stress Concern Present (08/22/2020)   [employer] of Occupational Health - Occupational Stress Questionnaire    Feeling of Stress : Not at all  Social Connections: Moderately Integrated (08/22/2020)   Social Connection and Isolation Panel [NHANES]  Frequency of Communication with Friends and Family: More than three times a week    Frequency of Social Gatherings with Friends and Family: More than three times a week    Attends Religious Services: More than 4 times per year    Active Member of [employer] or Organizations: No    Attends Banker Meetings: Never    Marital Status: Married  Catering manager Violence: Not At Risk (09/06/2022)   Humiliation, Afraid, Rape, and Kick questionnaire    Fear of Current or Ex-Partner: No    Emotionally Abused: No    Physically Abused: No    Sexually Abused: No     Code Status   Code Status: DNR  Review of Systems: All systems reviewed and negative except where noted in HPI.  Physical Exam: Vital signs in last 24 hours: Temp:  [96.3 F (35.7 C)-98.5 F (36.9 C)] 97.9 F (36.6 C) (07/19 0343) Pulse Rate:  [48-59] 59 (07/19 0343) Resp:  [16-18] 18 (07/19 0343) BP: (105-126)/(44-54) 126/50 (07/19 0343) SpO2:  [97 %-100 %] 97 % (07/19 0343) Last BM Date : 09/16/22  General:   Pleasant obese female in NAD Psych:  Cooperative. Normal mood and affect Eyes: Pupils equal Ears:  Normal auditory acuity Nose: No deformity, discharge or lesions Neck:  Supple, no masses felt Lungs:  Clear to auscultation.  Heart:  Regular rate  Abdomen:  Soft, nondistended, nontender, active bowel sounds, no masses felt Rectal :  Deferred Msk: Symmetrical without gross deformities.  Neurologic:  Alert, oriented, grossly normal neurologically Skin:  Intact without significant lesions.    Intake/Output from previous day: 07/18 0701 - 07/19 0700 In: -  Out: 100 [Urine:100] Intake/Output this shift:  Total I/O In: -  Out: 600 [Urine:600]     Willette Cluster, NP-C @  09/18/2022, 10:15 AM

## 2022-09-18 NOTE — Transportation (Addendum)
Patient left the floor with transport.

## 2022-09-18 NOTE — Progress Notes (Signed)
[facility]                                                                               [name] [name], is a 82 y.o. female, DOB - [birth year], GEX:[id number] Admit date - 09/06/2022    Outpatient Primary MD for the patient is [name], MD  LOS - 12  days    Brief summary   [name] [name] is an 82 y.o. F with CKD IV baseline ~1.7-2.2, DM, sCHF EF 35-40%, hx HSV encephalitis 2014, pAF on Eliquis, seizures, hx ESBL UTI who presented with dizziness, found to have stroke, acute renal failure and ESBL bacteremia.    Significant events: 7/7: Admitted for stroke, on antibiotics/gentle IVF; Neuro consulted 7/8: Nephrology consulted; BCx growing Klebsiella 7/10: Cr worse, IVF restarted 7/14: Hgb down to 6, transfused 2 units 7/15: Vascath placed, underwent first HD, required 3rd unit PRBCs 7/16: Hgb again <7, required 4th unit PRBC 7/17: HD #2    Significant studies: 7/7: CXR -- perihilar opacities, c/w infection or edema 7/7: MRI brain/MRA H&N -- small R postcentral gyrus infarct, no significant vessel disease  7/8: US renal -- unchanged mild L hydro, 1.5 cm cyst   Significant microbiology data: 7/7 BCx x2: ESBL Klebsiella in 2/2 7/7 UCx: ESBL Klebsiella 7/8 BCx x2: ESBL in 1/3 (although this was drawn in very close proximity to meropenem) 7/13 BCx x2: MRSE in 1/3 (likely contaminant)    Procedures: 7/15: Vascath placed, first HD 7/17: HD#2   Consults: Neurology Nephrology Palliative care    Assessment & Plan    Assessment and Plan: * Acute renal failure superimposed on stage 4 chronic kidney disease (HCC)  Secondary to ischemic ATN in the setting of ESBL bacteremia, CHF, ckd. SPEP negative.  [place] without clinically significant disease. Nephrology consulted,  HD as per nephrology.     Acute CVA (cerebrovascular accident) (HCC) MRI brain showed right post-central gyrus infarct - Non-invasive angiography showed no significant disease -  Echocardiogram completed 1 month ago showed no cardiogenic source of embolism - Lipids ordered: LDL undetectable - Continue Lipitor, Zetia - Eliquis held for guaiac positive stools.  - Afib known, on amiodarone.  - tPA not given because outside window - Dysphagia screen completed - PT eval ordered: recommend SNF  ESBL Bacteremia due to UTI Repeat blood cultures from 7/8 growing ESBL in 1 of 3, however this was drawn within minutes of first dose of meropenem, so I do not think change of plan is necessary  Completed the course of Meropenem.  Repeat blood cultures from 09/12/2022 show staph epidermidis coag neg, suspect a contaminant.  Leukocytosis improving. Recheck cbc in am.    Questionable Gi bleed? Guaiac positive stools. From 09/07/2022.  Hemoglobin dropped  to 5.8  few days ago from a baseline of 11 from 06/2022,  requiring 4 units of PRBC transfusion.  Currently hemoglobin stable around 7.5. repeat H&H pending.  Spouse is not aware of colonoscopy or EGd's in the past.  Eliquis is on hold for questionable GI blood loss.  IV protonix added.  GI consulted for recommendations.    Hyponatremia Resolved.     Hyperkalemia Resolved.  Chronic systolic CHF (congestive heart failure) (HCC) Last LVEF of 35% on 08/2022.  Fluid management as per HD.   Anemia in chronic kidney disease (CKD) Hemoglobin 7.5. s/p 4units of prbc transfusion.  Transfuse to keep hemoglobin >7.  Started ESA 7/13  CKD (chronic kidney disease) stage 4, GFR 15-29 ml/min (HCC)    CAD S/P percutaneous coronary angioplasty Continue with eliquis. BB held for bradycardia.  Get EKG.    Insulin dependent DM:  CBG (last 3)  Recent Labs    09/17/22 2102 09/18/22 0612 09/18/22 0650  GLUCAP 112* 60* 98   An episode of hypoglycemia this am, will d/c the Novolog mix 70/30.  Monitor cbg's and if they are consistently high, please introduce the Novolog mix gradually at a lower dose.   Seizure disorder  (HCC) No longer on AED  Paroxysmal atrial fibrillation (HCC) Rate controlled - Continue amiodarone, Eliquis held for occult positive blood.  Metoprolol held for bradycardia and borderline bp parameters.   Essential hypertension  Bp parameters are borderline soft. Holding metoprolol.   Hyperlipidemia - Continue Lipitor and Zetia    In view of multiple medical issues, deconditioning and ongoing decline, palliative care consulted for goals of care.   Estimated body mass index is 20.61 kg/m as calculated from the following:   Height as of this encounter: 5\' 9"  (1.753 m).   Weight as of this encounter: 63.3 kg.  Code Status: DNR DVT Prophylaxis:  SCD'S WILL BE ORDERED.    Level of Care: Level of care: Med-Surg Family Communication: Updated patient's spouse on the phone.   Disposition Plan:     Remains inpatient appropriate:  IV PROTONIX, HD.    Antimicrobials:   Anti-infectives (From admission, onward)    Start     Dose/Rate Route Frequency Ordered Stop   09/06/22 1400  meropenem (MERREM) 1 g in sodium chloride 0.9 % 100 mL IVPB        1 g 200 mL/hr over 30 Minutes Intravenous Every 24 hours 09/06/22 1254 09/14/22 0559        Medications  Scheduled Meds:  amiodarone  200 mg Oral Daily   atorvastatin  40 mg Oral Daily   Chlorhexidine Gluconate Cloth  6 each Topical Q0600   darbepoetin (ARANESP) injection - NON-DIALYSIS  60 mcg Subcutaneous Q Sat-1800   ezetimibe  10 mg Oral Daily   feeding supplement (NEPRO CARB STEADY)  237 mL Oral Q24H   heparin sodium (porcine)  10 mL Intracatheter Once   hydrocortisone cream   Topical TID   insulin aspart  0-6 Units Subcutaneous TID WC   pantoprazole (PROTONIX) IV  40 mg Intravenous Q24H   polyethylene glycol  17 g Oral Daily   sodium bicarbonate  650 mg Oral BID   witch hazel-glycerin   Topical TID   Continuous Infusions: PRN Meds:.acetaminophen, mouth rinse, oxyCODONE, senna-docusate    Subjective:   [name] [name] was seen and examined today.  No new events overnight.  Objective:   Vitals:   09/17/22 1625 09/17/22 1918 09/17/22 2334 09/18/22 0343  BP: (!) 120/50 (!) 105/51 (!) 123/54 (!) 126/50  Pulse: (!) 50 (!) 53 (!) 50 (!) 59  Resp: 17 18 18 18   Temp: (!) 97.5 F (36.4 C) 98.5 F (36.9 C) (!) 97.3 F (36.3 C) 97.9 F (36.6 C)  TempSrc: Oral  Oral Axillary  SpO2: 98% 98% 100% 97%  Weight:      Height:        Intake/Output Summary (  Last 24 hours) at 09/18/2022 1035 Last data filed at 09/18/2022 1000 Gross per 24 hour  Intake --  Output 700 ml  Net -700 ml   Filed Weights   09/16/22 0811 09/16/22 1144 09/17/22 0500  Weight: 82.6 kg (S) 82 kg 63.3 kg     Exam General exam: elderly woman, frail, not in distress.  Respiratory system: Clear to auscultation. Respiratory effort normal. Cardiovascular system: S1 & S2 heard, irregularly irregular, No JVD,  Gastrointestinal system: Abdomen is nondistended, soft and nontender.  Central nervous system: Alert and oriented to person only.  Extremities: trace edema.  Skin: No rashes,  Psychiatry: unable to assess    Data Reviewed:  I have personally reviewed following labs and imaging studies   CBC Lab Results  Component Value Date   WBC 13.3 (H) 09/17/2022   RBC 2.56 (L) 09/17/2022   HGB 7.5 (L) 09/17/2022   HCT 24.0 (L) 09/17/2022   MCV 93.8 09/17/2022   MCH 29.3 09/17/2022   PLT 219 09/17/2022   MCHC 31.3 09/17/2022   RDW 20.9 (H) 09/17/2022   LYMPHSABS 0.5 (L) 09/06/2022   MONOABS 0.5 09/06/2022   EOSABS 0.3 09/06/2022   BASOSABS 0.0 09/06/2022     Last metabolic panel Lab Results  Component Value Date   NA 135 09/17/2022   K 4.0 09/17/2022   CL 101 09/17/2022   CO2 25 09/17/2022   BUN 61 (H) 09/17/2022   CREATININE 2.75 (H) 09/17/2022   GLUCOSE 155 (H) 09/17/2022   GFRNONAA 17 (L) 09/17/2022   GFRAA 26 (L) 07/20/2018   CALCIUM 7.9 (L) 09/17/2022   PHOS 4.1 09/16/2022   PROT 4.6 (L) 09/17/2022    ALBUMIN <1.5 (L) 09/17/2022   LABGLOB 3.7 09/07/2022   AGRATIO 0.4 (L) 09/07/2022   BILITOT 0.8 09/17/2022   ALKPHOS 64 09/17/2022   AST 36 09/17/2022   ALT 14 09/17/2022   ANIONGAP 9 09/17/2022    CBG (last 3)  Recent Labs    09/17/22 2102 09/18/22 0612 09/18/22 0650  GLUCAP 112* 60* 98      Coagulation Profile: No results for input(s): "INR", "PROTIME" in the last 168 hours.   Radiology Studies: No results found.     Kathlen Mody M.D. [facility] 09/18/2022, 10:35 AM  Available via Epic secure chat 7am-7pm After 7 pm, please refer to night coverage provider listed on amion.

## 2022-09-18 NOTE — Progress Notes (Signed)
   09/18/22 1748  Vitals  Pulse Rate (!) 52  Resp 16  BP (!) 112/44  SpO2 96 %  Post Treatment  Dialyzer Clearance Clear  Duration of HD Treatment -hour(s) 3.5 hour(s)  Hemodialysis Intake (mL) 0 mL  Liters Processed 63  Fluid Removed (mL) 1000 mL  Tolerated HD Treatment Yes   Received patient in bed to unit.  Alert and oriented.  Informed consent signed and in chart.   TX duration:3.5hrs  Patient tolerated well.  Transported back to the room  Alert, without acute distress.  Hand-off given to patient's nurse.   Access used: [facility] Access issues: none  Total UF removed: 1L Medication(s) given: none    [name] [name] [facility]

## 2022-09-18 NOTE — Procedures (Signed)
Interventional Radiology Procedure Note  Procedure: Catheter conversion, right internal jugular temp cath --> 19 cm Palindrome tunneled HD catheter.  Tips in the RA and ready for use.  Complications: None  Estimated Blood Loss: None  Recommendations: - Routine line care   Signed,  Sterling Big, MD

## 2022-09-18 NOTE — Progress Notes (Signed)
PT Cancellation Note  Patient Details Name: [name] [name] MRN: [id number] DOB: [birth year]   Cancelled Treatment:    Reason Eval/Treat Not Completed: Patient at procedure or test/unavailable  Made a couple of attempts to work with patient today. At procedure this morning, now at HD. Will continue to follow during admission.  [name], PT, DPT [facility]  Rehabilitation Services Physical Therapist Office: [phone] Website: [email or web address]   Berton Mount 09/18/2022, 1:47 PM

## 2022-09-18 NOTE — Consult Note (Signed)
Chief Complaint: Patient was seen in consultation today for tunneled dialysis catheter placement  Referring Physician(s): Sabra Heck, MD  Supervising Physician: Simonne Come  Patient Status: [facility] - In-pt  History of Present Illness: [name] [name] is a 82 y.o. female medical history significant for atrial fibrillation on Eliquis, DM2 and heart failure. She was admitted 09/06/22 for ESBL e.coli UTI and CVA. Nephrology was consulted for AKI on CKD. Her kidney function has continued to worsen since admission. The patient underwent temporary dialysis catheter placement by Dr Deanne Coffer on 09/14/22 due to patient having an elevated white blood cell count at that time. Nephrology team now requesting temporary to tunneled dialysis catheter placement.  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   HYPERLIPIDEMIA 03/27/2006   HYPERTENSION 03/27/2006   Intertrochanteric fracture of right hip (HCC) 07/13/2012   LUNG NODULE 09/01/2006   Excision, Bx Benighn   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"   Osteopenia 2004   Dexa 2004 showed Osteopenia, DEXA 03/2007 normal   Osteopenia    Other chronic cystitis with hematuria    Pelvic fracture (HCC) 06/16/2016   S/P fall   Recurrent urinary tract infection    Seeing Urology   RETINOPATHY, BACKGROUND NOS 03/27/2006   Seizures (HCC)    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    Past Surgical History:  Procedure Laterality Date   ANKLE FRACTURE SURGERY Bilateral 08/2015   "right one was in 2 places"    CARDIAC CATHETERIZATION N/A 05/17/2015   Procedure: Left Heart Cath and Coronary Angiography;  Surgeon: Corky Crafts, MD;  Location: [facility] INVASIVE CV LAB;  Service: Cardiovascular;  Laterality: N/A;   CARDIAC CATHETERIZATION N/A 05/17/2015   Procedure: Coronary Balloon Angioplasty;  Surgeon: Corky Crafts, MD;  Location[facility] INVASIVE CV LAB;  Service: Cardiovascular;  Laterality: N/A;   CARDIOVERSION N/A 08/05/2022   Procedure: CARDIOVERSION;  Surgeon: Chilton Si, MD;  Location[facility]AS[facility] CV LAB;  Service: Cardiovascular;  Laterality: N/A;   FEMUR IM NAIL Right 07/15/2012   Procedure: INTRAMEDULLARY (IM) NAIL HIP;  Surgeon: Eulas Post, MD;  Location: [facility] OR;  Service: Orthopedics;  Laterality: Right;   FEMUR IM NAIL Left 12/02/2015   Procedure: INTRAMEDULLARY (IM) NAIL FEMORAL;  Surgeon: Eldred Manges, MD;  Location: [facility] OR;  Service: Orthopedics;  Laterality: Left;   FRACTURE SURGERY     INCISION AND DRAINAGE HIP Left 01/03/2016   Procedure: IRRIGATION AND DEBRIDEMENT HIP;  Surgeon: Eldred Manges, MD;  Location: [facility] OR;  Service: Orthopedics;  Laterality: Left;   IR FLUORO GUIDE CV LINE RIGHT  09/14/2022   IR US GUIDE VASC ACCESS RIGHT  09/14/2022   SHOULDER SURGERY Bilateral    "don't remember which side"    Allergies: Procaine hcl and Amoxicillin  Medications: Prior to Admission medications   Medication Sig Start Date End Date Taking? Authorizing Provider  alendronate (FOSAMAX) 35 MG tablet Take 35 mg by mouth every 7 (seven) days. Take  with a full glass of water on an empty stomach.   Yes [provider]  amiodarone (PACERONE) 200 MG tablet Take 1 tablet (200 mg total) by mouth daily. 08/13/22  Yes Zannie Cove, MD  apixaban (ELIQUIS) 2.5 MG TABS tablet Take 1 tablet (2.5 mg total) by mouth 2 (two) times daily. To start likely on Friday 6/14 if recommended by Coumadin clinic after labs done 08/21/22  Yes Chilton Si, MD  atorvastatin (LIPITOR) 80 MG tablet  Take 1 tablet (80 mg total) by mouth daily. 08/13/22  Yes [name], Nolon Rod, MD  ezetimibe (ZETIA) 10 MG tablet Take 1 tablet (10 mg total) by mouth daily. 12/04/21  Yes Chilton Si, MD  hydrocortisone 2.5 % cream Apply topically 2 (two) times daily. 08/31/22  Yes Wendling, Jilda Roche, DO  insulin NPH-regular Human (NOVOLIN 70/30 RELION) (70-30) 100 UNIT/ML injection 10 units in the morning and 5 units at night. Patient taking differently: Inject 5-23 Units into the skin daily. 07/23/22  Yes Dahal, Melina Schools, MD  metoprolol succinate (TOPROL-XL) 50 MG 24 hr tablet TAKE 1 TABLET BY MOUTH ONCE DAILY . APPOINTMENT REQUIRED FOR FUTURE REFILLS Patient taking differently: Take 50 mg by mouth daily. 06/15/22  Yes Chilton Si, MD  Multiple Vitamins-Minerals (MULTIVITAMIN ADULT PO) Take 1 tablet by mouth daily with breakfast.    Yes [provider]  VITAMIN D, CHOLECALCIFEROL, PO Take 1 tablet by mouth daily.   Yes [provider]  furosemide (LASIX) 40 MG tablet Take 1 tablet (40 mg total) by mouth daily as needed for fluid or edema. Patient not taking: Reported on 09/06/2022 07/23/22   Lorin Glass, MD  Nitroglycerin 0.4 % OINT Apply 1 ribbon of ointment anally every 12 hours for anal pain. Patient not taking: Reported on 09/06/2022 08/31/22   Sharlene Dory, DO     Family History  Problem Relation Age of Onset   Diabetes Father    Heart attack Father 68   Diabetes Sister    Drug abuse Son    Cancer Neg Hx        no hx of colon or breast cancer    Social History   Socioeconomic History   Marital status: Married    Spouse name: [name]   Number of children: 1   Years of education: College   Highest education level: Not on file  Occupational History   Occupation: retired     Associate Professor: RETIRED  Tobacco Use   Smoking status: Former    Current packs/day: 0.00    Types: Cigarettes    Quit date: 03/02/1978    Years since quitting: 44.5   Smokeless tobacco: Never  Vaping  Use   Vaping status: Never Used  Substance and Sexual Activity   Alcohol use: No    Alcohol/week: 0.0 standard drinks of alcohol   Drug use: No   Sexual activity: Not on file  Other Topics Concern   Not on file  Social History Narrative   Patient lives at home spouse. Uses a walker consistently.   Moved from [place] 2004   1 son, problems w/ drugs, passed away 2016-02-24-- OD       Social Determinants of Health   Financial Resource Strain: Low Risk  (08/22/2020)   Overall Financial Resource Strain (CARDIA)    Difficulty of Paying Living Expenses: Not hard at all  Food Insecurity: No Food Insecurity (09/06/2022)   Hunger Vital Sign    Worried About Running Out of Food in the Last  Year: Never true    Ran Out of Food in the Last Year: Never true  Transportation Needs: No Transportation Needs (09/06/2022)   PRAPARE - Administrator, Civil Service (Medical): No    Lack of Transportation (Non-Medical): No  Physical Activity: Inactive (08/22/2020)   Exercise Vital Sign    Days of Exercise per Week: 0 days    Minutes of Exercise per Session: 0 min  Stress: No Stress Concern Present (08/22/2020)   [employer] of Occupational Health - Occupational Stress Questionnaire    Feeling of Stress : Not at all  Social Connections: Moderately Integrated (08/22/2020)   Social Connection and Isolation Panel [NHANES]    Frequency of Communication with Friends and Family: More than three times a week    Frequency of Social Gatherings with Friends and Family: More than three times a week    Attends Religious Services: More than 4 times per year    Active Member of [employer] or Organizations: No    Attends Banker Meetings: Never    Marital Status: Married    Code Status: Patient currently has DNR order in place. Discussion with the patient and family regarding wishes.  The DNR order is rescinded during the procedure and the patient consents to the use of any resuscitation procedure  needed to treat the clinical events that occur.   Review of Systems: A 12 point ROS discussed and pertinent positives are indicated in the HPI above.  All other systems are negative.  Review of Systems  Constitutional:  Negative for chills and fever.  Respiratory:  Negative for chest tightness and shortness of breath.   Cardiovascular:  Negative for chest pain and leg swelling.  Gastrointestinal:  Negative for abdominal pain, diarrhea, nausea and vomiting.  Neurological:  Negative for dizziness and headaches.  Psychiatric/Behavioral:  Positive for confusion.     Vital Signs: BP (!) 126/50 (BP Location: Right Arm)   Pulse (!) 59   Temp 97.9 F (36.6 C) (Axillary)   Resp 18   Ht 5\' 9"  (1.753 m)   Wt 139 lb 8.8 oz (63.3 kg)   SpO2 97%   BMI 20.61 kg/m    Physical Exam Vitals reviewed.  Neck:     Comments: Temporary dialysis catheter at right neck Cardiovascular:     Rate and Rhythm: Normal rate and regular rhythm.     Pulses: Normal pulses.     Heart sounds: Normal heart sounds.  Pulmonary:     Effort: Pulmonary effort is normal.     Breath sounds: Normal breath sounds.  Abdominal:     Palpations: Abdomen is soft.     Tenderness: There is no abdominal tenderness.  Musculoskeletal:     Right lower leg: Edema present.     Left lower leg: Edema present.     Comments: 2+ pitting edema bilaterally  Skin:    General: Skin is warm and dry.  Neurological:     Mental Status: She is alert and oriented to person, place, and time.  Psychiatric:        Mood and Affect: Mood normal.        Behavior: Behavior normal.        Thought Content: Thought content normal.        Judgment: Judgment normal.     Imaging: IR Fluoro Guide CV Line Right  Result Date: 09/14/2022 CLINICAL DATA:  Dyspnea, chronic kidney disease EXAM: EXAM RIGHT IJ CATHETER PLACEMENT UNDER ULTRASOUND AND FLUOROSCOPIC GUIDANCE  TECHNIQUE: The procedure, risks (including but not limited to bleeding, infection,  organ damage, pneumothorax), benefits, and alternatives were explained to the patient. Questions regarding the procedure were encouraged and answered. The patient understands and consents to the procedure. Patency of the right IJ vein was confirmed with ultrasound with image documentation. An appropriate skin site was determined. Skin site was marked. Region was prepped using maximum barrier technique including cap and mask, sterile gown, sterile gloves, large sterile sheet, and Chlorhexidine as cutaneous antisepsis. The region was infiltrated locally with 1% lidocaine. Under real-time ultrasound guidance, the right IJ vein was accessed with a 21 gauge needle; the needle tip within the vein was confirmed with ultrasound image documentation. The needle exchanged over a 018 guidewire for vascular dilator which allowed advancement of a 20 cm Mahurkar catheter. This was positioned with the tip at the cavoatrial junction. Spot chest radiograph shows good positioning and no pneumothorax. Catheter was flushed and sutured externally with 0-Prolene suture. Patient tolerated the procedure well. FLUOROSCOPY TIME:  Radiation Exposure Index (as provided by the fluoroscopic device): 1 mGy air Kerma COMPLICATIONS: COMPLICATIONS none IMPRESSION: 1. Technically successful right IJ Mahurkar catheter placement. Electronically Signed   By: Corlis Leak M.D.   On: 09/14/2022 14:58   IR US Guide Vasc Access Right  Result Date: 09/14/2022 CLINICAL DATA:  Dyspnea, chronic kidney disease EXAM: EXAM RIGHT IJ CATHETER PLACEMENT UNDER ULTRASOUND AND FLUOROSCOPIC GUIDANCE TECHNIQUE: The procedure, risks (including but not limited to bleeding, infection, organ damage, pneumothorax), benefits, and alternatives were explained to the patient. Questions regarding the procedure were encouraged and answered. The patient understands and consents to the procedure. Patency of the right IJ vein was confirmed with ultrasound with image documentation. An  appropriate skin site was determined. Skin site was marked. Region was prepped using maximum barrier technique including cap and mask, sterile gown, sterile gloves, large sterile sheet, and Chlorhexidine as cutaneous antisepsis. The region was infiltrated locally with 1% lidocaine. Under real-time ultrasound guidance, the right IJ vein was accessed with a 21 gauge needle; the needle tip within the vein was confirmed with ultrasound image documentation. The needle exchanged over a 018 guidewire for vascular dilator which allowed advancement of a 20 cm Mahurkar catheter. This was positioned with the tip at the cavoatrial junction. Spot chest radiograph shows good positioning and no pneumothorax. Catheter was flushed and sutured externally with 0-Prolene suture. Patient tolerated the procedure well. FLUOROSCOPY TIME:  Radiation Exposure Index (as provided by the fluoroscopic device): 1 mGy air Kerma COMPLICATIONS: COMPLICATIONS none IMPRESSION: 1. Technically successful right IJ Mahurkar catheter placement. Electronically Signed   By: Corlis Leak M.D.   On: 09/14/2022 14:58   DG Chest 2 View  Result Date: 09/11/2022 CLINICAL DATA:  Dyspnea. EXAM: CHEST - 2 VIEW COMPARISON:  September 06, 2022 FINDINGS: Normal cardiac silhouette. Calcific atherosclerotic disease and tortuosity of the aorta. Interstitial pulmonary edema.  Bilateral small pleural effusions. IMPRESSION: Interstitial pulmonary edema with bilateral small pleural effusions. Electronically Signed   By: Ted Mcalpine M.D.   On: 09/11/2022 19:54   US RENAL  Result Date: 09/07/2022 CLINICAL DATA:  Chronic kidney disease stage 3 Diabetes Hypertension EXAM: RENAL / URINARY TRACT ULTRASOUND COMPLETE COMPARISON:  CT chest abdomen pelvis 07/20/2022 FINDINGS: Right Kidney: Renal measurements: 9.2 x 3.8 x 6.0 cm = volume: 109 mL. Echogenicity within normal limits. No mass or hydronephrosis visualized. 2.8 cm simple cyst in the upper pole does not require dedicated  imaging follow-up. 1.5 cm hypoechoic structure  in the lower pole of the right kidney does not appear anechoic. Left Kidney: Renal measurements: 10.8 x 6.0 x 5.3 cm = volume: 179 mL. Echogenicity within normal limits. No mass. Mild left hydronephrosis, similar to prior exam. 3.4 cm simple cyst in the lower pole does not require dedicated imaging follow-up. Bladder: Evaluation limited due to under distension. Other: None. IMPRESSION: 1. Unchanged mild left hydronephrosis. 2. 1.5 cm hypoechoic structure in the lower pole of the right kidney can not be characterized as a simple cyst given its internal echoes. It is more likely to be a mildly complicated cyst with proteinaceous or hemorrhagic debris thin a neoplasm. Recommend follow-up ultrasound in 6 months to again evaluate this finding. Electronically Signed   By: Acquanetta Belling M.D.   On: 09/07/2022 11:52   MR ANGIO NECK W WO CONTRAST  Result Date: 09/06/2022 CLINICAL DATA:  Infarct seen on noncontrast MRI EXAM: MRA NECK WITHOUT AND WITH CONTRAST MRA HEAD WITHOUT CONTRAST TECHNIQUE: Multiplanar and multiecho pulse sequences of the neck were obtained without and with intravenous contrast. Angiographic images of the neck were obtained using MRA technique without and with intravenous contrast; Angiographic images of the Circle of Willis were obtained using MRA technique without intravenous contrast. CONTRAST:  7mL GADAVIST GADOBUTROL 1 MMOL/ML IV SOLN COMPARISON:  None Available. FINDINGS: MRA NECK FINDINGS Aortic arch: The imaged aortic arch is normal. The origins of the major branch vessels are patent. The subclavian arteries are patent to the level imaged. Right carotid system: The right common, internal, and external carotid arteries are patent, with mild irregularity at the bulb likely reflecting atherosclerotic disease but no evidence of hemodynamically significant stenosis or occlusion. There is no evidence of dissection or aneurysm. Left carotid system: The  left common, internal, and external carotid arteries are patent, with mild irregularity at the bulb likely reflecting atherosclerotic disease but no evidence of hemodynamically significant stenosis or occlusion there is no evidence of dissection or aneurysm Vertebral arteries: The vertebral artery origins are not well assessed. Otherwise, the vertebral arteries are patent with antegrade flow. There is no evidence of hemodynamically significant stenosis or occlusion there is no evidence of dissection or aneurysm. Other: None. MRA HEAD FINDINGS Image quality is degraded by motion artifact at the level of the cavernous ICAs. Anterior circulation: The intracranial ICAs are patent, without significant stenosis or occlusion. The MCAs are patent, without proximal high-grade stenosis or occlusion. The bilateral ACAS are patent, without proximal stenosis or occlusion. There is no definite aneurysm. Posterior circulation: The bilateral V4 segments are patent. The basilar artery is patent. The major cerebellar arteries appear patent. The bilateral PCAs are patent, without proximal high-grade stenosis or occlusion. A left posterior communicating artery is identified. There is no definite aneurysm. Anatomic variants: None. Other: None. IMPRESSION: Patent vasculature of the head and neck without evidence of hemodynamically significant stenosis, occlusion, or dissection. Electronically Signed   By: Lesia Hausen M.D.   On: 09/06/2022 17:14   MR ANGIO HEAD WO CONTRAST  Result Date: 09/06/2022 CLINICAL DATA:  Infarct seen on noncontrast MRI EXAM: MRA NECK WITHOUT AND WITH CONTRAST MRA HEAD WITHOUT CONTRAST TECHNIQUE: Multiplanar and multiecho pulse sequences of the neck were obtained without and with intravenous contrast. Angiographic images of the neck were obtained using MRA technique without and with intravenous contrast; Angiographic images of the Circle of Willis were obtained using MRA technique without intravenous contrast.  CONTRAST:  7mL GADAVIST GADOBUTROL 1 MMOL/ML IV SOLN COMPARISON:  None Available. FINDINGS: MRA  NECK FINDINGS Aortic arch: The imaged aortic arch is normal. The origins of the major branch vessels are patent. The subclavian arteries are patent to the level imaged. Right carotid system: The right common, internal, and external carotid arteries are patent, with mild irregularity at the bulb likely reflecting atherosclerotic disease but no evidence of hemodynamically significant stenosis or occlusion. There is no evidence of dissection or aneurysm. Left carotid system: The left common, internal, and external carotid arteries are patent, with mild irregularity at the bulb likely reflecting atherosclerotic disease but no evidence of hemodynamically significant stenosis or occlusion there is no evidence of dissection or aneurysm Vertebral arteries: The vertebral artery origins are not well assessed. Otherwise, the vertebral arteries are patent with antegrade flow. There is no evidence of hemodynamically significant stenosis or occlusion there is no evidence of dissection or aneurysm. Other: None. MRA HEAD FINDINGS Image quality is degraded by motion artifact at the level of the cavernous ICAs. Anterior circulation: The intracranial ICAs are patent, without significant stenosis or occlusion. The MCAs are patent, without proximal high-grade stenosis or occlusion. The bilateral ACAS are patent, without proximal stenosis or occlusion. There is no definite aneurysm. Posterior circulation: The bilateral V4 segments are patent. The basilar artery is patent. The major cerebellar arteries appear patent. The bilateral PCAs are patent, without proximal high-grade stenosis or occlusion. A left posterior communicating artery is identified. There is no definite aneurysm. Anatomic variants: None. Other: None. IMPRESSION: Patent vasculature of the head and neck without evidence of hemodynamically significant stenosis, occlusion, or  dissection. Electronically Signed   By: Lesia Hausen M.D.   On: 09/06/2022 17:14   MR BRAIN WO CONTRAST  Result Date: 09/06/2022 CLINICAL DATA:  Neuro deficit with acute stroke suspected EXAM: MRI HEAD WITHOUT CONTRAST TECHNIQUE: Multiplanar, multiecho pulse sequences of the brain and surrounding structures were obtained without intravenous contrast. 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 Small acute infarct in the postcentral gyrus on the right chronic small vessel ischemia to a moderate degree. No acute hemorrhage, hydrocephalus, or masslike finding Vascular: Normal flow voids. Skull and upper cervical spine: Normal marrow signal. Sinuses/Orbits: Negative. IMPRESSION: 1. 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 2. 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 Electronically Signed   By: Tiburcio Pea M.D.   On: 09/06/2022 11:40   DG Chest Portable 1 View  Result Date: 09/06/2022 CLINICAL DATA:  Pain EXAM: PORTABLE CHEST 1 VIEW COMPARISON:  08/07/2022 FINDINGS: Stable cardiomediastinal contours. Aortic atherosclerosis. Perihilar and bibasilar interstitial opacities. No pleural effusion or pneumothorax. IMPRESSION: Perihilar and bibasilar interstitial opacities, which may reflect edema or atypical infection. Electronically Signed   By: Duanne Guess D.O.   On: 09/06/2022 10:14    Labs:  CBC: Recent Labs    09/14/22 0728 09/15/22 0432 09/16/22 0717 09/17/22 0112  WBC 17.5* 17.0* 15.7* 13.3*  HGB 6.4* 6.9* 7.5* 7.5*  HCT 19.4* 21.0* 22.9* 24.0*  PLT 303 264 237 219    COAGS: Recent Labs    08/11/22 0049 08/12/22 0024 08/14/22 1138 09/06/22 1001  INR 4.9* 3.9* 3.1* 2.0*    BMP: Recent Labs    09/14/22 0728 09/15/22 0432  09/16/22 0717 09/17/22 0112  NA 132* 134* 136 135  K 5.2* 4.6 4.5 4.0  CL 103 101 105 101  CO2  21* 24 25 25   GLUCOSE 148* 149* 122* 155*  BUN 134* 100* 109* 61*  CALCIUM 8.1* 7.8* 8.2* 7.9*  CREATININE 4.89* 3.58* 3.85* 2.75*  GFRNONAA 8* 12* 11* 17*    LIVER FUNCTION TESTS: Recent Labs    08/03/22 1655 08/08/22 0933 09/06/22 0944 09/12/22 0601 09/14/22 0728 09/15/22 0432 09/16/22 0717 09/17/22 0112  BILITOT 0.3 0.7 0.8  --   --   --   --  0.8  AST 51* 40 20  --   --   --   --  36  ALT 21 20 17   --   --   --   --  14  ALKPHOS 101 94 128*  --   --   --   --  64  PROT 7.4 6.4* 6.7  --   --   --   --  4.6*  ALBUMIN 2.1* 1.9* 1.8*   < > <1.5* <1.5* <1.5* <1.5*   < > = values in this interval not displayed.    TUMOR MARKERS: No results for input(s): "AFPTM", "CEA", "CA199", "CHROMGRNA" in the last 8760 hours.  Assessment and Plan:  [place] is an 82 year old being seen today for an image-guided tunneled dialysis catheter placement. Her WBC today is 13.3, down from 15.7 yesterday. The procedure was discussed with the patient at the bedside but she was very sleepy and requested that I call her husband for consent. She did give me verbal consent that she was in agreement for dialysis catheter placement. Her husband [name] [name] was spoken with over the phone and telephone consent was obtained.    Risks and benefits discussed with the patient's husband including, but not limited to bleeding, infection, vascular injury, pneumothorax which may require chest tube placement, air embolism or even death   All of the patient's questions were answered, patient is agreeable to proceed.  Consent signed and in IR.    Thank you for this interesting consult.  I greatly enjoyed meeting [name] [name] and look forward to participating in their care.  A copy of this report was sent to the requesting provider on this date.  Electronically Signed: Kennieth Francois, PA-C 09/18/2022, 11:22  AM   I spent a total of 20 Minutes  in face to face in clinical consultation, greater than 50% of which was counseling/coordinating care for tunneled dialysis catheter placement

## 2022-09-18 NOTE — Inpatient Diabetes Management (Signed)
Inpatient Diabetes Program Recommendations  AACE/ADA: New Consensus Statement on Inpatient Glycemic Control   Target Ranges:  Prepandial:   less than 140 mg/dL      Peak postprandial:   less than 180 mg/dL (1-2 hours)      Critically ill patients:  140 - 180 mg/dL    Latest Reference Range & Units 09/17/22 06:52 09/17/22 11:45 09/17/22 16:20 09/17/22 21:02 09/18/22 06:12 09/18/22 06:50  Glucose-Capillary 70 - 99 mg/dL 981 (H) 191 (H) 478 (H) 112 (H) 60 (L) 98   Review of Glycemic Control  Diabetes history: DM2 Outpatient Diabetes medications: 70/30 23 units QAM, 70/30 5 units QPM Current orders for Inpatient glycemic control: Novolog 0-6 units TID  Inpatient Diabetes Program Recommendations:    Insulin: Noted 70/30 10 units BID was discontinued; fasting CBG 60 mg/dl this am. If CBGs are consistently over 180 mg/dl with SSI, may need to consider restarting 70/30 at 5 units BID.  Thanks, Orlando Penner, RN, MSN, CDCES Diabetes Coordinator Inpatient Diabetes Program [phone] (Team Pager from 8am to 5pm)

## 2022-09-18 NOTE — Transportation (Signed)
Pt. Left the floor for HD with transport. Vitals received and LR bolus running. Report given to dialysis  RN this AM.

## 2022-09-18 NOTE — Progress Notes (Signed)
[facility] Progress Note   Subjective:    Resting this AM, no c/o  Objective Vitals:   09/17/22 1625 09/17/22 1918 09/17/22 2334 09/18/22 0343  BP: (!) 120/50 (!) 105/51 (!) 123/54 (!) 126/50  Pulse: (!) 50 (!) 53 (!) 50 (!) 59  Resp: 17 18 18 18   Temp: (!) 97.5 F (36.4 C) 98.5 F (36.9 C) (!) 97.3 F (36.3 C) 97.9 F (36.6 C)  TempSrc: Oral  Oral Axillary  SpO2: 98% 98% 100% 97%  Weight:      Height:       Physical Exam Gen: sleepy, no distress Eyes: anicteric, EOMI ENT: MMM CV:  RRR, no rub  Abd: soft, nontender Back:CTAB GU: purewick with yellow urine in cannister  Extr:trace LE edema Neuro: arousable but sleepy, answering questions Skin: no rashes noted  Additional Objective Labs: Basic Metabolic Panel: Recent Labs  Lab 09/14/22 0728 09/15/22 0432 09/16/22 0717 09/17/22 0112  NA 132* 134* 136 135  K 5.2* 4.6 4.5 4.0  CL 103 101 105 101  CO2 21* 24 25 25   GLUCOSE 148* 149* 122* 155*  BUN 134* 100* 109* 61*  CREATININE 4.89* 3.58* 3.85* 2.75*  CALCIUM 8.1* 7.8* 8.2* 7.9*  PHOS 4.5 3.5 4.1  --    Liver Function Tests: Recent Labs  Lab 09/15/22 0432 09/16/22 0717 09/17/22 0112  AST  --   --  36  ALT  --   --  14  ALKPHOS  --   --  64  BILITOT  --   --  0.8  PROT  --   --  4.6*  ALBUMIN <1.5* <1.5* <1.5*   No results for input(s): "LIPASE", "AMYLASE" in the last 168 hours. CBC: Recent Labs  Lab 09/13/22 0341 09/13/22 1156 09/14/22 0728 09/15/22 0432 09/16/22 0717 09/17/22 0112  WBC 16.0*  --  17.5* 17.0* 15.7* 13.3*  HGB 5.8*   < > 6.4* 6.9* 7.5* 7.5*  HCT 17.6*   < > 19.4* 21.0* 22.9* 24.0*  MCV 87.1  --  86.2 87.9 89.1 93.8  PLT 361  --  303 264 237 219   < > = values in this interval not displayed.   Blood Culture    Component Value Date/Time   SDES BLOOD RIGHT HAND 09/12/2022 1434   SDES BLOOD LEFT HAND 09/12/2022 1434   SPECREQUEST  09/12/2022 1434    BOTTLES DRAWN AEROBIC AND ANAEROBIC Blood Culture  adequate volume   SPECREQUEST  09/12/2022 1434    BOTTLES DRAWN AEROBIC AND ANAEROBIC Blood Culture results may not be optimal due to an excessive volume of blood received in culture bottles   CULT  09/12/2022 1434    NO GROWTH 5 DAYS Performed at [facility] Lab, [address] [place], [place] [address]    CULT (A) 09/12/2022 1434    STAPHYLOCOCCUS EPIDERMIDIS THE SIGNIFICANCE OF ISOLATING THIS ORGANISM FROM A SINGLE SET OF BLOOD CULTURES WHEN MULTIPLE SETS ARE DRAWN IS UNCERTAIN. PLEASE NOTIFY THE MICROBIOLOGY DEPARTMENT WITHIN ONE WEEK IF SPECIATION AND SENSITIVITIES ARE REQUIRED. Performed at [facility], [address] [place], [place] [address]    REPTSTATUS 09/17/2022 FINAL 09/12/2022 1434   REPTSTATUS 09/15/2022 FINAL 09/12/2022 1434    Cardiac Enzymes: No results for input(s): "CKTOTAL", "CKMB", "CKMBINDEX", "TROPONINI" in the last 168 hours. CBG: Recent Labs  Lab 09/17/22 1145 09/17/22 1620 09/17/22 2102 09/18/22 0612 09/18/22 0650  GLUCAP 158* 116* 112* 60* 98   Iron Studies:  No results  for input(s): "IRON", "TIBC", "TRANSFERRIN", "FERRITIN" in the last 72 hours.  @lablastinr3 @ Studies/Results: No results found. Medications:    amiodarone  200 mg Oral Daily   atorvastatin  40 mg Oral Daily   Chlorhexidine Gluconate Cloth  6 each Topical Q0600   darbepoetin (ARANESP) injection - NON-DIALYSIS  60 mcg Subcutaneous Q Sat-1800   ezetimibe  10 mg Oral Daily   feeding supplement (NEPRO CARB STEADY)  237 mL Oral Q24H   heparin sodium (porcine)  10 mL Intracatheter Once   hydrocortisone cream   Topical TID   insulin aspart  0-6 Units Subcutaneous TID WC   pantoprazole (PROTONIX) IV  40 mg Intravenous Q24H   polyethylene glycol  17 g Oral Daily   sodium bicarbonate  650 mg Oral BID   witch hazel-glycerin   Topical TID    Assessment/PlanVirginia E [name] is an 82 y.o. female atrial fibrillation, DM type 2, HFrEF, HTN, HL,  currently admitted for ESBL  E coli UTI and CVA for whom nephrology is consulted for evaluation and management of AKI on CKD.    **AKI on CKD:  baseline Cr was in mid 2s > 08/2022 admission 2.6 > 3.6 > 3 now presenting with Cr 4.55 > 4.6 > 4.7 > 4.8 > 5.1 > 4.9.  BUN increasing further.  Has uremic symptoms.  No nephrology care outpt.  UA consistent with UTI.  Imaging with no new findings - do not suspect mild hydro on L significantly contributing.  Modest hypotension noted - suspect ATN related to acute infection + hypovolemia.  S/p IV fluid challenge and kidney function continued to worsen and develooped uremic symptoms.   Temp R internal jugular HD cath placed 7/15 HD #1 7/15 Cont on MWF schedule for now, no heparin Start CLIP as AKI In process Will need to placed [facility] prior to DC, not done initially 2/2 infectious issues; IR following   **acute CVA: post central gyrus, neurology following.  PT/OT have been consulted.    **Hyponatremia:  resolved  **Hyperkalemia: improved / resolved with HD   **Anemia, normocytic:  Baseline in the past few months 8-9s.  Dropped this AM from 7-8s to 5.8 with report of dark stools. Iron sat 11% - s/p  IV. FE.  Started ESA 7/13.  Transfusion per primary.    **HFrEF:  EF 35% 08/2022.  UF with HD   **UTI: ESBL Klebsiella, sens only to imipenem. Finished ABX   **R renal hypoechoic structure:  rads rec 34mo f/u US ensure stable   **DM: insulin per primary.    **A fib:  on  BB, amiodarone.  Holding eliquis in light of poss GIB   Will follow, call with concerns.   Arita Miss, MD  09/18/2022, 10:38 AM  [facility]

## 2022-09-19 DIAGNOSIS — N179 Acute kidney failure, unspecified: Secondary | ICD-10-CM | POA: Diagnosis not present

## 2022-09-19 DIAGNOSIS — N184 Chronic kidney disease, stage 4 (severe): Secondary | ICD-10-CM | POA: Diagnosis not present

## 2022-09-19 LAB — CBC
HCT: 26.6 % — ABNORMAL LOW (ref 36.0–46.0)
Hemoglobin: 8.2 g/dL — ABNORMAL LOW (ref 12.0–15.0)
MCH: 29.4 pg (ref 26.0–34.0)
MCHC: 30.8 g/dL (ref 30.0–36.0)
MCV: 95.3 fL (ref 80.0–100.0)
Platelets: 272 10*3/uL (ref 150–400)
RBC: 2.79 MIL/uL — ABNORMAL LOW (ref 3.87–5.11)
RDW: 23.6 % — ABNORMAL HIGH (ref 11.5–15.5)
WBC: 12.2 10*3/uL — ABNORMAL HIGH (ref 4.0–10.5)
nRBC: 0 % (ref 0.0–0.2)

## 2022-09-19 LAB — BASIC METABOLIC PANEL
Anion gap: 9 (ref 5–15)
BUN: 29 mg/dL — ABNORMAL HIGH (ref 8–23)
CO2: 27 mmol/L (ref 22–32)
Calcium: 7.7 mg/dL — ABNORMAL LOW (ref 8.9–10.3)
Chloride: 98 mmol/L (ref 98–111)
Creatinine, Ser: 2.12 mg/dL — ABNORMAL HIGH (ref 0.44–1.00)
GFR, Estimated: 23 mL/min — ABNORMAL LOW (ref >60)
Glucose, Bld: 166 mg/dL — ABNORMAL HIGH (ref 70–99)
Potassium: 3.6 mmol/L (ref 3.5–5.1)
Sodium: 134 mmol/L — ABNORMAL LOW (ref 135–145)

## 2022-09-19 LAB — GLUCOSE, CAPILLARY
Glucose-Capillary: 196 mg/dL — ABNORMAL HIGH (ref 70–99)
Glucose-Capillary: 213 mg/dL — ABNORMAL HIGH (ref 70–99)
Glucose-Capillary: 281 mg/dL — ABNORMAL HIGH (ref 70–99)

## 2022-09-19 NOTE — Progress Notes (Signed)
[facility] Progress Note   Subjective:    Resting this AM, no c/o HD yesterday 1L UF 0.9L UOP reported yesterday HD cath tunneled yesterday with IR  Objective Vitals:   09/18/22 2005 09/18/22 2326 09/19/22 0322 09/19/22 0834  BP: 128/75 (!) 125/57 139/60 (!) 151/83  Pulse: 74 84 68 80  Resp: 16 18 18    Temp: 98 F (36.7 C) 98.1 F (36.7 C) 97.8 F (36.6 C) (!) 97.3 F (36.3 C)  TempSrc: Oral Oral Oral   SpO2:  97% 97% 97%  Weight:      Height:       Physical Exam Gen: awake alert  in bed, no distress Eyes: anicteric, EOMI ENT: MMM CV:  RRR, no rub  Abd: soft, nontender Back:CTAB Extr:trace LE edema Neuro: arousable but sleepy, answering questions Skin: no rashes noted R internal jugular TDC bandaged  Additional Objective Labs: Basic Metabolic Panel: Recent Labs  Lab 09/14/22 0728 09/15/22 0432 09/16/22 0717 09/17/22 0112  NA 132* 134* 136 135  K 5.2* 4.6 4.5 4.0  CL 103 101 105 101  CO2 21* 24 25 25   GLUCOSE 148* 149* 122* 155*  BUN 134* 100* 109* 61*  CREATININE 4.89* 3.58* 3.85* 2.75*  CALCIUM 8.1* 7.8* 8.2* 7.9*  PHOS 4.5 3.5 4.1  --    Liver Function Tests: Recent Labs  Lab 09/15/22 0432 09/16/22 0717 09/17/22 0112  AST  --   --  36  ALT  --   --  14  ALKPHOS  --   --  64  BILITOT  --   --  0.8  PROT  --   --  4.6*  ALBUMIN <1.5* <1.5* <1.5*   No results for input(s): "LIPASE", "AMYLASE" in the last 168 hours. CBC: Recent Labs  Lab 09/13/22 0341 09/13/22 1156 09/14/22 0728 09/15/22 0432 09/16/22 0717 09/17/22 0112 09/18/22 1040  WBC 16.0*  --  17.5* 17.0* 15.7* 13.3*  --   HGB 5.8*   < > 6.4* 6.9* 7.5* 7.5* 8.2*  HCT 17.6*   < > 19.4* 21.0* 22.9* 24.0* 26.5*  MCV 87.1  --  86.2 87.9 89.1 93.8  --   PLT 361  --  303 264 237 219  --    < > = values in this interval not displayed.   Blood Culture    Component Value Date/Time   SDES BLOOD RIGHT HAND 09/12/2022 1434   SDES BLOOD LEFT HAND 09/12/2022 1434    SPECREQUEST  09/12/2022 1434    BOTTLES DRAWN AEROBIC AND ANAEROBIC Blood Culture adequate volume   SPECREQUEST  09/12/2022 1434    BOTTLES DRAWN AEROBIC AND ANAEROBIC Blood Culture results may not be optimal due to an excessive volume of blood received in culture bottles   CULT  09/12/2022 1434    NO GROWTH 5 DAYS Performed at [facility], [address] [place], [place] [address]    CULT (A) 09/12/2022 1434    STAPHYLOCOCCUS EPIDERMIDIS THE SIGNIFICANCE OF ISOLATING THIS ORGANISM FROM A SINGLE SET OF BLOOD CULTURES WHEN MULTIPLE SETS ARE DRAWN IS UNCERTAIN. PLEASE NOTIFY THE MICROBIOLOGY DEPARTMENT WITHIN ONE WEEK IF SPECIATION AND SENSITIVITIES ARE REQUIRED. Performed at [facility] Lab, [address], [place], [place] [address]    REPTSTATUS 09/17/2022 FINAL 09/12/2022 1434   REPTSTATUS 09/15/2022 FINAL 09/12/2022 1434    Cardiac Enzymes: No results for input(s): "CKTOTAL", "CKMB", "CKMBINDEX", "TROPONINI" in the last 168 hours. CBG: Recent Labs  Lab 09/18/22 0612 09/18/22  2440 09/18/22 1234 09/18/22 1828 09/19/22 0831  GLUCAP 60* 98 99 88 196*   Iron Studies:  No results for input(s): "IRON", "TIBC", "TRANSFERRIN", "FERRITIN" in the last 72 hours.  @lablastinr3 @ Studies/Results: IR Fluoro Guide CV Line Right  Result Date: 09/18/2022 INDICATION: Conversion of temporary hemodialysis catheter to tunneled hemodialysis catheter. EXAM: IR RIGHT FLUORO GUIDE CV LINE MEDICATIONS: 2 g Ancef; The antibiotic was administered within an appropriate time interval prior to skin puncture. ANESTHESIA/SEDATION: Moderate (conscious) sedation was employed during this procedure. A total of Versed 1 mg and Fentanyl 75 mcg was administered intravenously. Moderate Sedation Time: 12 minutes. The patient's level of consciousness and vital signs were monitored continuously by radiology nursing throughout the procedure under my direct supervision. FLUOROSCOPY TIME:  Radiation exposure index:  1 mGy reference air kerma COMPLICATIONS: None immediate. PROCEDURE: Informed written consent was obtained from the patient after a thorough discussion of the procedural risks, benefits and alternatives. All questions were addressed. Maximal Sterile Barrier Technique was utilized including caps, mask, sterile gowns, sterile gloves, sterile drape, hand hygiene and skin antiseptic. A timeout was performed prior to the initiation of the procedure. The existing right IJ temporary hemodialysis catheter was completely prepped and draped. A 0.035 wire was advanced through the central line lumen of the tunneled catheter, through the right atrium and into the inferior vena cava. Local anesthesia at the catheter insertion site was attained by infiltration with 1% lidocaine. Additionally, a suitable skin exit site inferior to the clavicle was anesthetized with 1% lidocaine. A small dermatotomy was made. Next, a 19 cm palindrome hemodialysis catheter was tunneled from the skin exit site to the dermatotomy where the existing temporary catheter enters the vein. Next, the existing catheter was removed over the wire. A peel-away sheath was then advanced over the wire and into the right heart. The new 19 cm palindrome hemodialysis catheter was then advanced through the peel-away sheath and the peel-away sheath was discarded. The catheter tip is in the right atrium. Images were obtained and stored for the medical record. The catheter flushes and aspirates easily. The catheter was flushed, locked with heparinized saline, capped and secured to the skin with 0 Prolene suture. Sterile bandages were applied. IMPRESSION: Successful exchange for a new 19 cm palindrome tunneled hemodialysis catheter. Catheter tips are in the right atrium and the device is ready for immediate use. Electronically Signed   By: [name] [name] M.D.   On: 09/18/2022 12:46   Medications:    amiodarone  200 mg Oral Daily   atorvastatin  40 mg Oral Daily    Chlorhexidine Gluconate Cloth  6 each Topical Q0600   darbepoetin (ARANESP) injection - NON-DIALYSIS  60 mcg Subcutaneous Q Sat-1800   ezetimibe  10 mg Oral Daily   feeding supplement (NEPRO CARB STEADY)  237 mL Oral Q24H   heparin sodium (porcine)  10 mL Intracatheter Once   hydrocortisone cream   Topical TID   insulin aspart  0-6 Units Subcutaneous TID WC   pantoprazole (PROTONIX) IV  40 mg Intravenous Q24H   polyethylene glycol  17 g Oral Daily   witch hazel-glycerin   Topical TID    Assessment/PlanVirginia E [name] is an 82 y.o. female atrial fibrillation, DM type 2, HFrEF, HTN, HL,  currently admitted for ESBL E coli UTI and CVA for whom nephrology is consulted for evaluation and management of AKI on CKD.    **AKI on CKD:  baseline Cr was in mid 2s > 08/2022 admission 2.6 >  3.6 > 3 now presenting with Cr 4.55 > 4.6 > 4.7 > 4.8 > 5.1 > 4.9.  BUN increasing further.  Has uremic symptoms.  No nephrology care outpt.  UA consistent with UTI.  Imaging with no new findings - do not suspect mild hydro on L significantly contributing.  Modest hypotension noted - suspect ATN related to acute infection + hypovolemia.  S/p IV fluid challenge and kidney function continued to worsen and develooped uremic symptoms.   Temp R internal jugular HD cath placed 7/15; tunneled 7/19 HD #1 7/15 Cont on MWF schedule for now, no heparin Started CLIP as AKI In process   **acute CVA: post central gyrus, neurology following.  PT/OT have been consulted.    **Hyponatremia:  resolved  **Hyperkalemia: improved / resolved with HD   **Anemia, normocytic:  Baseline in the past few months 8-9s.  Dropped this AM from 7-8s to 5.8 with report of dark stools. Iron sat 11% - s/p  IV. FE.  Started ESA 7/13.  Transfusion per primary.    **HFrEF:  EF 35% 08/2022.  UF with HD   **UTI: ESBL Klebsiella, sens only to imipenem. Finished ABX   **R renal hypoechoic structure:  rads rec 35mo f/u US ensure stable   **DM: insulin per  primary.    **A fib:  on  BB, amiodarone.  Holding eliquis in light of poss GIB   Will follow, call with concerns.   Arita Miss, MD  09/19/2022, 10:14 AM  [facility]

## 2022-09-19 NOTE — Progress Notes (Signed)
Occupational Therapy Treatment Patient Details Name: [name] [name] MRN: [id number] DOB: 1940/07/31 Today's Date: 09/19/2022   History of present illness Pt is an 82 y/o female who presents 09/06/22 s/p syncope and fall while walking to the car as family was attempting to bring her to the ED for vomiting, weakness. Of note, pt with recent admission with d/c 07/10/22 from [facility] for UTI and hematura with elevated INR (discharged with foley). PMH significant for A fib, L eye blindness, DM II, HTN, osteopenia, pelvic fracture 2018, seizures, systolic CHF, ankle fx surgery 2017, IM nail R in 2014 and L 2017, I&D hip 2017, B shoulder surgery.   OT comments  Pt with good progress toward established OT goals. Challenging strength and activity tolerance with transfers and prolonged standing for pericare as pt with active bowel movement once initiated transfer and no awareness. Pt able to static stand with RW support for up to 30 seconds before attempting to lean forward onto RW and with poor upright positioning, requiring seated rest break. Pt continued to have stool but could not identify whether she was still going, thus prolonged sitting and standing bouts 3+ times. Transferred to chair with all needs met. Patient will benefit from continued inpatient follow up therapy, <3 hours/day    Recommendations for follow up therapy are one component of a multi-disciplinary discharge planning process, led by the attending physician.  Recommendations may be updated based on patient status, additional functional criteria and insurance authorization.    Assistance Recommended at Discharge Frequent or constant Supervision/Assistance  Patient can return home with the following  A lot of help with bathing/dressing/bathroom;A lot of help with walking and/or transfers   Equipment Recommendations  Hospital bed;Wheelchair cushion (measurements OT);Wheelchair (measurements OT);BSC/3in1    Recommendations for Other Services       Precautions / Restrictions Precautions Precautions: Fall Precaution Comments: watch BP (orthostatic 7/17); L eye blindness       Mobility Bed Mobility Overal bed mobility: Needs Assistance Bed Mobility: Supine to Sit Rolling: Min guard         General bed mobility comments: increased time    Transfers Overall transfer level: Needs assistance Equipment used: Rolling walker (2 wheels) Transfers: Sit to/from Stand, Bed to chair/wheelchair/BSC Sit to Stand: Min assist, +2 safety/equipment, +2 physical assistance     Step pivot transfers: Min assist, +2 physical assistance, +2 safety/equipment     General transfer comment: Min A of 2 to rise; Poor use/hand placement on RW. Steadying assist with steps toward chair     Balance Overall balance assessment: Needs assistance Sitting-balance support: Single extremity supported, Feet supported Sitting balance-Leahy Scale: Poor Sitting balance - Comments: min guard for sitting balance on EOB   Standing balance support: Bilateral upper extremity supported Standing balance-Leahy Scale: Poor                             ADL either performed or assessed with clinical judgement   ADL Overall ADL's : Needs assistance/impaired Eating/Feeding: Set up;Sitting Eating/Feeding Details (indicate cue type and reason): intermittent cues to locate items on plate                     Toilet Transfer: Minimal assistance;Stand-pivot;Rolling walker (2 wheels);+2 for physical assistance Toilet Transfer Details (indicate cue type and reason): with RN Toileting- Clothing Manipulation and Hygiene: Total assistance Toileting - Clothing Manipulation Details (indicate cue type and reason): at EOB pt unaware she  was having BM     Functional mobility during ADLs: Minimal assistance;+2 for physical assistance;+2 for safety/equipment;Rolling walker (2 wheels)      Extremity/Trunk Assessment Upper Extremity Assessment Upper  Extremity Assessment: Generalized weakness   Lower Extremity Assessment Lower Extremity Assessment: Defer to PT evaluation        Vision   Vision Assessment?: Vision impaired- to be further tested in functional context Additional Comments: L eye blindness. Pt also with difficulty compensating at times and losing glasses in her lap several times.   Perception     Praxis      Cognition Arousal/Alertness: Awake/alert Behavior During Therapy: Flat affect Overall Cognitive Status: Impaired/Different from baseline Area of Impairment: Awareness, Safety/judgement, Problem solving                         Safety/Judgement: Decreased awareness of safety, Decreased awareness of deficits Awareness: Intellectual (unaware of actively having bowel movement EOB) Problem Solving: Difficulty sequencing, Requires verbal cues, Slow processing General Comments: following one step commands with incresed time.        Exercises      Shoulder Instructions       General Comments VSS at end of session; pt RN reporting BP dropped several minutes after session while up in chair    Pertinent Vitals/ Pain       Pain Assessment Pain Assessment: Faces Faces Pain Scale: Hurts little more Pain Location: BLE with weightbearing Pain Descriptors / Indicators: Grimacing, Guarding, Moaning Pain Intervention(s): Limited activity within patient's tolerance, Monitored during session  Home Living                                          Prior Functioning/Environment              Frequency  Min 2X/week        Progress Toward Goals  OT Goals(current goals can now be found in the care plan section)  Progress towards OT goals: Progressing toward goals  Acute Rehab OT Goals Patient Stated Goal: get better OT Goal Formulation: With patient Time For Goal Achievement: 09/21/22 Potential to Achieve Goals: Fair ADL Goals Pt Will Perform Eating: with modified  independence;sitting;bed level Pt Will Perform Grooming: with set-up;sitting Pt Will Transfer to Toilet: with min assist;stand pivot transfer;bedside commode Additional ADL Goal #1: Pt to complete bed mobility with min guard in prep for ADLs  Plan Discharge plan remains appropriate    Co-evaluation                 AM-PAC OT "6 Clicks" Daily Activity     Outcome Measure   Help from another person eating meals?: A Little Help from another person taking care of personal grooming?: A Little Help from another person toileting, which includes using toliet, bedpan, or urinal?: Total Help from another person bathing (including washing, rinsing, drying)?: A Lot Help from another person to put on and taking off regular upper body clothing?: A Lot Help from another person to put on and taking off regular lower body clothing?: Total 6 Click Score: 12    End of Session Equipment Utilized During Treatment: Rolling walker (2 wheels)  OT Visit Diagnosis: Unsteadiness on feet (R26.81);Other abnormalities of gait and mobility (R26.89);Muscle weakness (generalized) (M62.81)   Activity Tolerance Patient limited by pain   Patient Left in chair;with call bell/phone within reach;with  chair alarm set;with family/visitor present   Nurse Communication Mobility status        Time: [phone] OT Time Calculation (min): 26 min  Charges: OT General Charges $OT Visit: 1 Visit OT Treatments $Self Care/Home Management : 8-22 mins $Therapeutic Activity: 8-22 mins  Myrla Halsted, OTD, OTR/L [facility] Acute Rehabilitation Office: [phone]   Myrla Halsted 09/19/2022, 2:04 PM

## 2022-09-19 NOTE — Progress Notes (Signed)
Progress Note   Patient: [name] [name] UEA:[id number] DOB: 08/26/1940 DOA: 09/06/2022     13 DOS: the patient was seen and examined on 09/19/2022 at 8:22AM      Brief hospital course: [name] [name] is an 82 y.o. F with CKD IV baseline ~1.7-2.2, DM, sCHF EF 35-40%, hx HSV encephalitis 2014, pAF on Eliquis, seizures, hx ESBL UTI who presented with dizziness, found to have stroke, acute renal failure and ESBL bacteremia.    Significant events: 7/7: Admitted for stroke, on antibiotics/gentle IVF; Neuro consulted 7/8: Nephrology consulted; BCx growing Klebsiella 7/10: Cr worse, IVF restarted 7/14: Hgb down to 6, transfused 2 units 7/15: Vascath placed, underwent first HD, required 3rd unit PRBCs 7/16: Hgb again <7, required 4th unit PRBC 7/17: HD #2 7/19: Tunneled line placed, HD#3    Significant studies: 7/7: CXR -- perihilar opacities, c/w infection or edema 7/7: MRI brain/MRA H&N -- small R postcentral gyrus infarct, no significant vessel disease  7/8: US renal -- unchanged mild L hydro, 1.5 cm cyst   Significant microbiology data: 7/7 BCx x2: ESBL Klebsiella in 2/2 7/7 UCx: ESBL Klebsiella 7/8 BCx x2: ESBL in 1/3 (although this was drawn in very close proximity to meropenem) 7/13 BCx x2: MRSE in 1/3 (likely contaminant)    Procedures: 7/15: Vascath placed, first HD 7/17: HD#2 7/19: Tunnled line placed, HD#3   Consults: Neurology Nephrology      Assessment and Plan: * Acute renal failure superimposed on stage 4 chronic kidney disease (HCC) Hyponatremia Hyperkalemia SPEP negative.  [place] without clinically significant disease.  Suspect this is ischemic ATN in setting of ESBL bacteremia and CHF and chronic kidney disease.  Improved now after 3 sessions of HD, uremia is better. - HD per Nephrology - Consult Nephrology - Consult Palliative care     ESBL Bacteremia due to UTI Found on admission to have bacteremia.  Cultures growing ESBL.  Repeat blood cultures  from 7/8 growing ESBL in 1 of 3, however this was drawn within minutes of first dose of meropenem, so we do not feel this was from treatment failure.  Completed 7 days Meropenem     Acute CVA (cerebrovascular accident) (HCC) MRI brain on admission showed right post-central gyrus infarct - Non-invasive angiography showed no significant disease - Echocardiogram completed 1 month ago showed no cardiogenic source of embolism - Lipids ordered: LDL undetectable - Continue Lipitor, Zetia - Aspirin deferred in place of Eliquis - Afib known, stable on Eliquis and amiodarone - tPA not given because outside window - Dysphagia screen completed - PT eval ordered: recommend SNF   Anemia in chronic kidney disease (CKD) Iron studies normal.  Hemoglobin trended down and required PRBC transfusion x4 last week.  Low suspicion for GI bleed. Hgb has rebounded and is now stably low.    - Trend Hgb - Transfusion threshold 7 g/dL - EPO per Nephrology - Hold Eliquis for now    Chronic systolic CHF (congestive heart failure) (HCC) - Fluid management by HD - Continue metoprolol  Paroxysmal atrial fibrillation (HCC) Rate controlled - Continue amiodarone - Hold metoprolol due to orthostasis - Hold Eliquis until Hgb trajectory clearer  CAD S/P percutaneous coronary angioplasty - Continue atorvastatin, Zetia - Hold Eliquis - Hold metoprolol due to orthostasis  Diabetes (HCC) Glucoses low to normal - Hold NPH - Continue sliding scale corrections   Seizure disorder (HCC) No longer on AED  Essential hypertension BP orthostatic - Hold metoprolol  Hyperlipidemia - Continue Lipitor and Zetia  Subjective: No fever, no respiratory distress.  She is irritable because her breakfast is not what she ordered.  No chest pain, dyspnea, confusion.    Physical Exam: BP (!) 103/43 (BP Location: Left Arm)   Pulse 75   Temp (!) 97.4 F (36.3 C) (Oral)   Resp 20   Ht 5\' 9"   (1.753 m)   Wt 63.3 kg   SpO2 97%   BMI 20.61 kg/m   Elderly adult female, sitting up in bed, interactive RRR, no murmurs, no peripheral edema Respiratory rate normal, lungs clear without rales or wheezes Abdomen soft without grimace to palpation, no ascites or distention Attention normal, affect normal, judgment and insight appear normal    Data Reviewed: CBC shows hemoglobin up to 8.2, white blood cell count down to 12 Basic metabolic panel shows potassium normal, creatinine down to 2.12      Disposition: Status is: Inpatient The patient was admitted for renal failure, stroke, and ESBL bacteremia  Her bacteremia is treated, her stroke is stable, but her renal failure has not resolved and she has had to start dialysis.  Although she has had overall poor prognosis, she is improving considerably with dialysis.  Continue palliative care consultations, likely to SNF eventually        Author: Alberteen Sam, MD 09/19/2022 4:07 PM  For on call review [email or web address].

## 2022-09-19 NOTE — Progress Notes (Signed)
Patient got up to the chair around 1300 BP 88/50 (60).  Rn raised her legs and rechcked in 30 min. BP 91/46(62). I immediatly laid back BP 103/43(62)

## 2022-09-19 NOTE — Progress Notes (Signed)
Physical Therapy Treatment Patient Details Name: [name] [name] MRN: [id number] DOB: September 20, 1940 Today's Date: 09/19/2022   History of Present Illness Pt is an 82 y/o female who presents 09/06/22 s/p syncope and fall while walking to the car as family was attempting to bring her to the ED for vomiting, weakness. Of note, pt with recent admission with d/c 07/10/22 from [facility] for UTI and hematura with elevated INR (discharged with foley). PMH significant for A fib, L eye blindness, DM II, HTN, osteopenia, pelvic fracture 2018, seizures, systolic CHF, ankle fx surgery 2017, IM nail R in 2014 and L 2017, I&D hip 2017, B shoulder surgery.    PT Comments  Eager to participate and stand. Min assist for boost to stand from recliner 3 times today. Limited by bowel incontinence each time. (Dark tarry stools - RN notified.) BP also dropped (see below) however pt remained asymptomatic throughout session. Seems to be making some improvements over all. Anticipate she will be able to ambulate short distances soon. Tolerated LE exercises well and encouraged to perform between therapy sessions several times per day. Verbalized understanding. Patient will continue to benefit from skilled physical therapy services to further improve independence with functional mobility. Patient will benefit from continued inpatient follow up therapy, <3 hours/day      Assistance Recommended at Discharge Frequent or constant Supervision/Assistance  If plan is discharge home, recommend the following:  Can travel by private vehicle    A lot of help with bathing/dressing/bathroom;Assistance with cooking/housework;Direct supervision/assist for medications management;Direct supervision/assist for financial management;Help with stairs or ramp for entrance;Assist for transportation;Assistance with feeding;A lot of help with walking and/or transfers   Yes  Equipment Recommendations  None recommended by PT (TBD next venue of care)     Recommendations for Other Services       Precautions / Restrictions Precautions Precautions: Fall Precaution Comments: watch BP (orthostatic 7/17); L eye blindness Restrictions Weight Bearing Restrictions: Yes     Mobility  Bed Mobility               General bed mobility comments: in recliner    Transfers Overall transfer level: Needs assistance Equipment used: Rolling walker (2 wheels) Transfers: Sit to/from Stand Sit to Stand: Min assist           General transfer comment: Min assist for boost to stand from recliner x3 today, progressing towards min guard eventually. VC for hand placement on arm rests to push up. Slow to rise, stable with RW for support.    Ambulation/Gait               General Gait Details: Deferred due to episode of bowel incontinence each time she stood. Also BP drop but asymptomatic.   Stairs             Wheelchair Mobility     Tilt Bed    Modified Rankin (Stroke Patients Only) Modified Rankin (Stroke Patients Only) Pre-Morbid Rankin Score: Moderately severe disability Modified Rankin: Severe disability     Balance Overall balance assessment: Needs assistance Sitting-balance support: Feet supported, No upper extremity supported Sitting balance-Leahy Scale: Fair Sitting balance - Comments: Sits Edge of chair no assist   Standing balance support: Bilateral upper extremity supported Standing balance-Leahy Scale: Poor                              Cognition Arousal/Alertness: Awake/alert Behavior During Therapy: Flat affect Overall Cognitive Status: No family/caregiver  present to determine baseline cognitive functioning Area of Impairment: Safety/judgement, Problem solving, Memory, Following commands                     Memory: Decreased short-term memory Following Commands: Follows one step commands consistently Safety/Judgement: Decreased awareness of deficits   Problem Solving:  Difficulty sequencing, Requires verbal cues          Exercises General Exercises - Lower Extremity Ankle Circles/Pumps: AROM, Both, 10 reps, Supine Quad Sets: Strengthening, Both, 10 reps, Seated Gluteal Sets: Strengthening, Both, 10 reps, Seated Long Arc Quad: Strengthening, Both, 10 reps, Seated Hip ABduction/ADduction: Both, 10 reps, Supine, Strengthening Hip Flexion/Marching: Strengthening, Both, 10 reps, Seated    General Comments General comments (skin integrity, edema, etc.): Systolic dropped from 114 seated, to 97 standing, and up to 101 after returning to chair. asymptomatic throughout.      Pertinent Vitals/Pain Pain Assessment Pain Assessment: No/denies pain Pain Intervention(s): Monitored during session    Home Living                          Prior Function            PT Goals (current goals can now be found in the care plan section) Acute Rehab PT Goals Patient Stated Goal: return home PT Goal Formulation: With patient Time For Goal Achievement: 09/21/22 Potential to Achieve Goals: Fair Progress towards PT goals: Progressing toward goals    Frequency    Min 3X/week      PT Plan Current plan remains appropriate    Co-evaluation              AM-PAC PT "6 Clicks" Mobility   Outcome Measure  Help needed turning from your back to your side while in a flat bed without using bedrails?: A Lot Help needed moving from lying on your back to sitting on the side of a flat bed without using bedrails?: A Lot Help needed moving to and from a bed to a chair (including a wheelchair)?: A Lot Help needed standing up from a chair using your arms (e.g., wheelchair or bedside chair)?: A Little Help needed to walk in hospital room?: A Lot Help needed climbing 3-5 steps with a railing? : Total 6 Click Score: 12    End of Session Equipment Utilized During Treatment: Gait belt Activity Tolerance: Patient tolerated treatment well;Other (comment)  (Limited by bowel incontinence) Patient left: with call bell/phone within reach;in chair;with chair alarm set Nurse Communication: Mobility status;Other (comment) (BP, bowel incontinence (dark tarry stool), purewick discarded due to being soiled.) PT Visit Diagnosis: Unsteadiness on feet (R26.81);Muscle weakness (generalized) (M62.81);Difficulty in walking, not elsewhere classified (R26.2);Pain;Other abnormalities of gait and mobility (R26.89) Pain - part of body:  (buttocks)     Time: 7425-9563 PT Time Calculation (min) (ACUTE ONLY): 22 min  Charges:    $Therapeutic Activity: 8-22 mins PT General Charges $$ ACUTE PT VISIT: 1 Visit                     [name], PT, DPT [facility]  Rehabilitation Services Physical Therapist Office: [phone] Website: [email or web address]    Berton Mount 09/19/2022, 4:18 PM

## 2022-09-19 NOTE — Plan of Care (Signed)
  Problem: Metabolic: Goal: Ability to maintain appropriate glucose levels will improve Outcome: Progressing   Problem: Skin Integrity: Goal: Risk for impaired skin integrity will decrease Outcome: Progressing   

## 2022-09-20 DIAGNOSIS — N170 Acute kidney failure with tubular necrosis: Secondary | ICD-10-CM

## 2022-09-20 DIAGNOSIS — N3001 Acute cystitis with hematuria: Secondary | ICD-10-CM | POA: Diagnosis not present

## 2022-09-20 DIAGNOSIS — E1165 Type 2 diabetes mellitus with hyperglycemia: Secondary | ICD-10-CM | POA: Diagnosis not present

## 2022-09-20 DIAGNOSIS — I639 Cerebral infarction, unspecified: Secondary | ICD-10-CM | POA: Diagnosis not present

## 2022-09-20 LAB — CBC
HCT: 26.2 % — ABNORMAL LOW (ref 36.0–46.0)
Hemoglobin: 8.1 g/dL — ABNORMAL LOW (ref 12.0–15.0)
MCH: 29.8 pg (ref 26.0–34.0)
MCHC: 30.9 g/dL (ref 30.0–36.0)
MCV: 96.3 fL (ref 80.0–100.0)
Platelets: 268 10*3/uL (ref 150–400)
RBC: 2.72 MIL/uL — ABNORMAL LOW (ref 3.87–5.11)
RDW: 22.9 % — ABNORMAL HIGH (ref 11.5–15.5)
WBC: 11.1 10*3/uL — ABNORMAL HIGH (ref 4.0–10.5)
nRBC: 0 % (ref 0.0–0.2)

## 2022-09-20 LAB — COMPREHENSIVE METABOLIC PANEL
ALT: 9 U/L (ref 0–44)
AST: 26 U/L (ref 15–41)
Albumin: <1.5 g/dL — ABNORMAL LOW (ref 3.5–5.0)
Alkaline Phosphatase: 74 U/L (ref 38–126)
Anion gap: 6 (ref 5–15)
BUN: 32 mg/dL — ABNORMAL HIGH (ref 8–23)
CO2: 26 mmol/L (ref 22–32)
Calcium: 7.5 mg/dL — ABNORMAL LOW (ref 8.9–10.3)
Chloride: 100 mmol/L (ref 98–111)
Creatinine, Ser: 2.35 mg/dL — ABNORMAL HIGH (ref 0.44–1.00)
GFR, Estimated: 20 mL/min — ABNORMAL LOW (ref >60)
Glucose, Bld: 192 mg/dL — ABNORMAL HIGH (ref 70–99)
Potassium: 3.7 mmol/L (ref 3.5–5.1)
Sodium: 132 mmol/L — ABNORMAL LOW (ref 135–145)
Total Bilirubin: 0.5 mg/dL (ref 0.3–1.2)
Total Protein: 5.1 g/dL — ABNORMAL LOW (ref 6.5–8.1)

## 2022-09-20 LAB — GLUCOSE, CAPILLARY
Glucose-Capillary: 175 mg/dL — ABNORMAL HIGH (ref 70–99)
Glucose-Capillary: 131 mg/dL — ABNORMAL HIGH (ref 70–99)
Glucose-Capillary: 280 mg/dL — ABNORMAL HIGH (ref 70–99)
Glucose-Capillary: 292 mg/dL — ABNORMAL HIGH (ref 70–99)

## 2022-09-20 MED ORDER — APIXABAN 2.5 MG PO TABS
2.5000 mg | ORAL_TABLET | Freq: Two times a day (BID) | ORAL | Status: DC
  Administered 2022-09-20 – 2022-09-24 (×8): 2.5 mg via ORAL
  Filled 2022-09-20 (×8): qty 1

## 2022-09-20 NOTE — Plan of Care (Signed)
  Problem: Education: Goal: Knowledge of disease or condition will improve Outcome: Progressing Goal: Knowledge of secondary prevention will improve (MUST DOCUMENT ALL) Outcome: Progressing Goal: Knowledge of patient specific risk factors will improve Loraine Leriche N/A or DELETE if not current risk factor) Outcome: Progressing   Problem: Ischemic Stroke/TIA Tissue Perfusion: Goal: Complications of ischemic stroke/TIA will be minimized Outcome: Progressing   Problem: Coping: Goal: Will verbalize positive feelings about self Outcome: Progressing Goal: Will identify appropriate support needs Outcome: Progressing   Problem: Health Behavior/Discharge Planning: Goal: Ability to manage health-related needs will improve Outcome: Progressing Goal: Goals will be collaboratively established with patient/family Outcome: Progressing   Problem: Self-Care: Goal: Ability to participate in self-care as condition permits will improve Outcome: Progressing   Problem: Nutrition: Goal: Risk of aspiration will decrease Outcome: Progressing Goal: Dietary intake will improve Outcome: Progressing

## 2022-09-20 NOTE — Plan of Care (Signed)
  Problem: Coping: Goal: Ability to adjust to condition or change in health will improve Outcome: Progressing   Problem: Fluid Volume: Goal: Ability to maintain a balanced intake and output will improve Outcome: Progressing   Problem: Skin Integrity: Goal: Risk for impaired skin integrity will decrease Outcome: Progressing   Problem: Education: Goal: Knowledge of disease or condition will improve Outcome: Progressing

## 2022-09-20 NOTE — Progress Notes (Signed)
[facility] Progress Note   Subjective:    Resting this AM, no c/o 0.4L UOP reported yesterday  Objective Vitals:   09/19/22 2319 09/20/22 0316 09/20/22 0527 09/20/22 0900  BP: 135/72 136/63  135/66  Pulse: 71 66  80  Resp: 18 18  16   Temp: 98.3 F (36.8 C) 98 F (36.7 C)  98.6 F (37 C)  TempSrc: Oral Oral  Oral  SpO2: 98% 94%  92%  Weight:   81.2 kg   Height:       Physical Exam Gen: sleeping in bed, no distress upon awakening Eyes: anicteric, EOMI ENT: MMM CV:  RRR, no rub  Abd: soft, nontender Back:CTAB Extr:trace LE edema Neuro: arousable but sleepy, answering questions Skin: no rashes noted R internal jugular TDC bandaged  Additional Objective Labs: Basic Metabolic Panel: Recent Labs  Lab 09/14/22 0728 09/15/22 0432 09/16/22 0717 09/17/22 0112 09/19/22 1101 09/20/22 0425  NA 132* 134* 136 135 134* 132*  K 5.2* 4.6 4.5 4.0 3.6 3.7  CL 103 101 105 101 98 100  CO2 21* 24 25 25 27 26   GLUCOSE 148* 149* 122* 155* 166* 192*  BUN 134* 100* 109* 61* 29* 32*  CREATININE 4.89* 3.58* 3.85* 2.75* 2.12* 2.35*  CALCIUM 8.1* 7.8* 8.2* 7.9* 7.7* 7.5*  PHOS 4.5 3.5 4.1  --   --   --    Liver Function Tests: Recent Labs  Lab 09/16/22 0717 09/17/22 0112 09/20/22 0425  AST  --  36 26  ALT  --  14 9  ALKPHOS  --  64 74  BILITOT  --  0.8 0.5  PROT  --  4.6* 5.1*  ALBUMIN <1.5* <1.5* <1.5*   No results for input(s): "LIPASE", "AMYLASE" in the last 168 hours. CBC: Recent Labs  Lab 09/15/22 0432 09/16/22 0717 09/17/22 0112 09/18/22 1040 09/19/22 1101 09/20/22 0425  WBC 17.0* 15.7* 13.3*  --  12.2* 11.1*  HGB 6.9* 7.5* 7.5* 8.2* 8.2* 8.1*  HCT 21.0* 22.9* 24.0* 26.5* 26.6* 26.2*  MCV 87.9 89.1 93.8  --  95.3 96.3  PLT 264 237 219  --  272 268   Blood Culture    Component Value Date/Time   SDES BLOOD RIGHT HAND 09/12/2022 1434   SDES BLOOD LEFT HAND 09/12/2022 1434   SPECREQUEST  09/12/2022 1434    BOTTLES DRAWN AEROBIC AND  ANAEROBIC Blood Culture adequate volume   SPECREQUEST  09/12/2022 1434    BOTTLES DRAWN AEROBIC AND ANAEROBIC Blood Culture results may not be optimal due to an excessive volume of blood received in culture bottles   CULT  09/12/2022 1434    NO GROWTH 5 DAYS Performed at [facility] Lab, [address] [place], [place] [address]    CULT (A) 09/12/2022 1434    STAPHYLOCOCCUS EPIDERMIDIS THE SIGNIFICANCE OF ISOLATING THIS ORGANISM FROM A SINGLE SET OF BLOOD CULTURES WHEN MULTIPLE SETS ARE DRAWN IS UNCERTAIN. PLEASE NOTIFY THE MICROBIOLOGY DEPARTMENT WITHIN ONE WEEK IF SPECIATION AND SENSITIVITIES ARE REQUIRED. Performed at [facility], [address], [place], [place] [address]    REPTSTATUS 09/17/2022 FINAL 09/12/2022 1434   REPTSTATUS 09/15/2022 FINAL 09/12/2022 1434    Cardiac Enzymes: No results for input(s): "CKTOTAL", "CKMB", "CKMBINDEX", "TROPONINI" in the last 168 hours. CBG: Recent Labs  Lab 09/18/22 1828 09/19/22 0831 09/19/22 1226 09/19/22 1657 09/20/22 0538  GLUCAP 88 196* 213* 281* 175*   Iron Studies:  No results for input(s): "IRON", "TIBC", "TRANSFERRIN", "FERRITIN" in the  last 72 hours.  @lablastinr3 @ Studies/Results: IR Fluoro Guide CV Line Right  Result Date: 09/18/2022 INDICATION: Conversion of temporary hemodialysis catheter to tunneled hemodialysis catheter. EXAM: IR RIGHT FLUORO GUIDE CV LINE MEDICATIONS: 2 g Ancef; The antibiotic was administered within an appropriate time interval prior to skin puncture. ANESTHESIA/SEDATION: Moderate (conscious) sedation was employed during this procedure. A total of Versed 1 mg and Fentanyl 75 mcg was administered intravenously. Moderate Sedation Time: 12 minutes. The patient's level of consciousness and vital signs were monitored continuously by radiology nursing throughout the procedure under my direct supervision. FLUOROSCOPY TIME:  Radiation exposure index: 1 mGy reference air kerma COMPLICATIONS: None  immediate. PROCEDURE: Informed written consent was obtained from the patient after a thorough discussion of the procedural risks, benefits and alternatives. All questions were addressed. Maximal Sterile Barrier Technique was utilized including caps, mask, sterile gowns, sterile gloves, sterile drape, hand hygiene and skin antiseptic. A timeout was performed prior to the initiation of the procedure. The existing right IJ temporary hemodialysis catheter was completely prepped and draped. A 0.035 wire was advanced through the central line lumen of the tunneled catheter, through the right atrium and into the inferior vena cava. Local anesthesia at the catheter insertion site was attained by infiltration with 1% lidocaine. Additionally, a suitable skin exit site inferior to the clavicle was anesthetized with 1% lidocaine. A small dermatotomy was made. Next, a 19 cm palindrome hemodialysis catheter was tunneled from the skin exit site to the dermatotomy where the existing temporary catheter enters the vein. Next, the existing catheter was removed over the wire. A peel-away sheath was then advanced over the wire and into the right heart. The new 19 cm palindrome hemodialysis catheter was then advanced through the peel-away sheath and the peel-away sheath was discarded. The catheter tip is in the right atrium. Images were obtained and stored for the medical record. The catheter flushes and aspirates easily. The catheter was flushed, locked with heparinized saline, capped and secured to the skin with 0 Prolene suture. Sterile bandages were applied. IMPRESSION: Successful exchange for a new 19 cm palindrome tunneled hemodialysis catheter. Catheter tips are in the right atrium and the device is ready for immediate use. Electronically Signed   By: Malachy Moan M.D.   On: 09/18/2022 12:46   Medications:    amiodarone  200 mg Oral Daily   atorvastatin  40 mg Oral Daily   Chlorhexidine Gluconate Cloth  6 each Topical  Q0600   darbepoetin (ARANESP) injection - NON-DIALYSIS  60 mcg Subcutaneous Q Sat-1800   ezetimibe  10 mg Oral Daily   feeding supplement (NEPRO CARB STEADY)  237 mL Oral Q24H   heparin sodium (porcine)  10 mL Intracatheter Once   hydrocortisone cream   Topical TID   insulin aspart  0-6 Units Subcutaneous TID WC   pantoprazole (PROTONIX) IV  40 mg Intravenous Q24H   polyethylene glycol  17 g Oral Daily   witch hazel-glycerin   Topical TID    Assessment/PlanVirginia E [name] is an 82 y.o. female atrial fibrillation, DM type 2, HFrEF, HTN, HL,  currently admitted for ESBL E coli UTI and CVA for whom nephrology is consulted for evaluation and management of AKI on CKD.    **AKI on CKD:  baseline Cr was in mid 2s > 08/2022 admission 2.6 > 3.6 > 3 now presenting with Cr 4.55 > 4.6 > 4.7 > 4.8 > 5.1 > 4.9.  BUN increasing further.  Has uremic symptoms.  No nephrology  care outpt.  UA consistent with UTI.  Imaging with no new findings - do not suspect mild hydro on L significantly contributing.  Modest hypotension noted - suspect ATN related to acute infection + hypovolemia.  S/p IV fluid challenge and kidney function continued to worsen and develooped uremic symptoms.   Temp R internal jugular HD cath placed 7/15; tunneled 7/19 HD #1 7/15 Cont on MWF schedule for now, no heparin Started CLIP as AKI In process; f/u tomorrow   **acute CVA: post central gyrus, neurology following.  PT/OT have been consulted.    **Hyponatremia:  mild, stable, HD addressing  **Hyperkalemia: improved / resolved with HD   **Anemia, normocytic:  Baseline in the past few months 8-9s.  Dropped this AM from 7-8s to 5.8 with report of dark stools. Iron sat 11% - s/p  IV. FE.  Started ESA 7/13.  Transfusion per primary.    **HFrEF:  EF 35% 08/2022.  UF with HD   **UTI: ESBL Klebsiella, sens only to imipenem. Finished ABX   **R renal hypoechoic structure:  rads rec 78mo f/u US ensure stable   **DM: insulin per primary.     **A fib:  on  BB, amiodarone.  Holding eliquis in light of poss GIB   Will follow, call with concerns.   Arita Miss, MD  09/20/2022, 10:11 AM  [facility]

## 2022-09-20 NOTE — Progress Notes (Signed)
[facility]                                                                              [place] Bob[place], is a 82 y.o. female, DOB - 12-Jul-1940, [id number] Admit date - 09/06/2022    Outpatient Primary MD for the patient is [name], MD  LOS - 14  days  Chief Complaint  Patient presents with   Weakness   Dizziness       Brief summary   Patient is a 82 y.o. F with CKD IV baseline ~1.7-2.2, DM, sCHF EF 35-40%, hx HSV encephalitis 2014, pAF on Eliquis, seizures, hx ESBL UTI who presented with dizziness, found to have stroke, acute renal failure and ESBL bacteremia.  Significant events: 7/7: Admitted for stroke, on antibiotics/gentle IVF; Neuro consulted 7/8: Nephrology consulted; BCx growing Klebsiella 7/10: Cr worse, IVF restarted 7/14: Hgb down to 6, transfused 2 units 7/15: Vascath placed, underwent first HD, required 3rd unit PRBCs 7/16: Hgb again <7, required 4th unit PRBC 7/17: HD #2 7/19: Tunneled line placed, HD#3     Significant studies: 7/7: CXR -- perihilar opacities, c/w infection or edema 7/7: MRI brain/MRA H&N -- small R postcentral gyrus infarct, no significant vessel disease  7/8: US renal -- unchanged mild L hydro, 1.5 cm cyst     Significant microbiology data: 7/7 BCx x2: ESBL Klebsiella in 2/2 7/7 UCx: ESBL Klebsiella 7/8 BCx x2: ESBL in 1/3 (although this was drawn in very close proximity to meropenem) 7/13 BCx x2: MRSE in 1/3 (likely contaminant)     Procedures: 7/15: Vascath placed, first HD 7/17: HD#2 7/19: Tunnled line placed, HD#3    Assessment & Plan    Principal Problem:   Acute renal failure superimposed on stage 4 chronic kidney disease (HCC), 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  [place] without clinically significant disease.   - likely ischemic ATN in setting of ESBL bacteremia and CHF and chronic kidney disease.  -Improved now after 3 sessions of HD, uremia is better. - Hemodialysis per  nephrology -Continue MWF schedule for now, started CLIP process for outpatient HD center       Active problems ESBL Bacteremia due to UTI -Found on admission to have bacteremia, cultures positive for ESBL. -Completed 7 days Meropenem        Acute CVA (cerebrovascular accident) (HCC) -MRI brain on admission showed right post-central gyrus infarct - Non-invasive angiography showed no significant disease - Echocardiogram completed 1 month ago showed no cardiogenic source of embolism - Lipids ordered: LDL undetectable - Continue Lipitor, Zetia - Aspirin deferred in place of Eliquis - Afib known, on amiodarone, Eliquis has been on hold, H&H has been stable, will resume. - tPA not given because outside window - Dysphagia screen completed - PT eval ordered: recommend SNF     Anemia in chronic kidney disease (CKD) - Iron studies normal.  Hemoglobin trended down and required PRBC transfusion x4 last week.   - Low suspicion for GI bleed.  Hemoglobin now has remained stable -Eliquis has been on hold.  Patient was seen by GI on 7/19, recommended supportive care, no therapeutic  interventions unless signs of GI hemorrhage -Will resume Eliquis, follow H&H closely -If H&H worsening, then will discontinue eliquis      Chronic systolic CHF (congestive heart failure) (HCC) - Volume management with HD    Paroxysmal atrial fibrillation (HCC) Rate controlled - Continue amiodarone - Metoprolol has been held due to orthostasis - Resume Eliquis   CAD S/P percutaneous coronary angioplasty - Continue atorvastatin, Zetia - Resume Eliquis.  Metoprolol currently held due to orthostasis   Diabetes (HCC) - Hold NPH - Continue sensitive SSI CBG (last 3)  Recent Labs    09/19/22 1226 09/19/22 1657 09/20/22 0538  GLUCAP 213* 281* 175*      Seizure disorder (HCC) No longer on AED   Essential hypertension BP orthostatic - Hold metoprolol   Hyperlipidemia - Continue Lipitor and Zetia      Estimated body mass index is 26.44 kg/m as calculated from the following:   Height as of this encounter: 5\' 9"  (1.753 m).   Weight as of this encounter: 81.2 kg.  Code Status: DNR DVT Prophylaxis:  Resume Eliquis   Level of Care: Level of care: Med-Surg Family Communication:  Disposition Plan:      Remains inpatient appropriate: Workup in progress   Consultants:   Neurology Nephrology Gastroenterology  Antimicrobials:   Anti-infectives (From admission, onward)    Start     Dose/Rate Route Frequency Ordered Stop   09/18/22 1215  ceFAZolin (ANCEF) IVPB 2g/100 mL premix        2 g 200 mL/hr over 30 Minutes Intravenous On call 09/18/22 1124 09/18/22 1300   09/06/22 1400  meropenem (MERREM) 1 g in sodium chloride 0.9 % 100 mL IVPB        1 g 200 mL/hr over 30 Minutes Intravenous Every 24 hours 09/06/22 1254 09/14/22 0559          Medications  amiodarone  200 mg Oral Daily   atorvastatin  40 mg Oral Daily   Chlorhexidine Gluconate Cloth  6 each Topical Q0600   darbepoetin (ARANESP) injection - NON-DIALYSIS  60 mcg Subcutaneous Q Sat-1800   ezetimibe  10 mg Oral Daily   feeding supplement (NEPRO CARB STEADY)  237 mL Oral Q24H   heparin sodium (porcine)  10 mL Intracatheter Once   hydrocortisone cream   Topical TID   insulin aspart  0-6 Units Subcutaneous TID WC   pantoprazole (PROTONIX) IV  40 mg Intravenous Q24H   polyethylene glycol  17 g Oral Daily   witch hazel-glycerin   Topical TID      Subjective:   [name] was seen and examined today.  No complaints per the patient.  Overnight no acute issues, no fever chills, any bleeding.  Resting comfortably.    Objective:   Vitals:   09/19/22 2319 09/20/22 0316 09/20/22 0527 09/20/22 0900  BP: 135/72 136/63  135/66  Pulse: 71 66  80  Resp: 18 18  16   Temp: 98.3 F (36.8 C) 98 F (36.7 C)  98.6 F (37 C)  TempSrc: Oral Oral  Oral  SpO2: 98% 94%  92%  Weight:   81.2 kg   Height:         Intake/Output Summary (Last 24 hours) at 09/20/2022 1017 Last data filed at 09/19/2022 2300 Gross per 24 hour  Intake 480 ml  Output 400 ml  Net 80 ml     Wt Readings from Last 3 Encounters:  09/20/22 81.2 kg  08/25/22 75.7 kg  08/12/22 79.8 kg  Exam General: Alert and oriented, NAD, resting comfortably, ill-appearing Cardiovascular: S1 S2 auscultated,  RRR Respiratory: Clear to auscultation bilaterally, no wheezing Gastrointestinal: Soft, nontender, nondistended, + bowel sounds Ext: no pedal edema bilaterally Neuro: no new deficits    Data Reviewed:  I have personally reviewed following labs    CBC Lab Results  Component Value Date   WBC 11.1 (H) 09/20/2022   RBC 2.72 (L) 09/20/2022   HGB 8.1 (L) 09/20/2022   HCT 26.2 (L) 09/20/2022   MCV 96.3 09/20/2022   MCH 29.8 09/20/2022   PLT 268 09/20/2022   MCHC 30.9 09/20/2022   RDW 22.9 (H) 09/20/2022   LYMPHSABS 0.5 (L) 09/06/2022   MONOABS 0.5 09/06/2022   EOSABS 0.3 09/06/2022   BASOSABS 0.0 09/06/2022     Last metabolic panel Lab Results  Component Value Date   NA 132 (L) 09/20/2022   K 3.7 09/20/2022   CL 100 09/20/2022   CO2 26 09/20/2022   BUN 32 (H) 09/20/2022   CREATININE 2.35 (H) 09/20/2022   GLUCOSE 192 (H) 09/20/2022   GFRNONAA 20 (L) 09/20/2022   GFRAA 26 (L) 07/20/2018   CALCIUM 7.5 (L) 09/20/2022   PHOS 4.1 09/16/2022   PROT 5.1 (L) 09/20/2022   ALBUMIN <1.5 (L) 09/20/2022   LABGLOB 3.7 09/07/2022   AGRATIO 0.4 (L) 09/07/2022   BILITOT 0.5 09/20/2022   ALKPHOS 74 09/20/2022   AST 26 09/20/2022   ALT 9 09/20/2022   ANIONGAP 6 09/20/2022    CBG (last 3)  Recent Labs    09/19/22 1226 09/19/22 1657 09/20/22 0538  GLUCAP 213* 281* 175*      Coagulation Profile: No results for input(s): "INR", "PROTIME" in the last 168 hours.   Radiology Studies: I have personally reviewed the imaging studies  IR Fluoro Guide CV Line Right  Result Date: 09/18/2022 INDICATION:  Conversion of temporary hemodialysis catheter to tunneled hemodialysis catheter. EXAM: IR RIGHT FLUORO GUIDE CV LINE MEDICATIONS: 2 g Ancef; The antibiotic was administered within an appropriate time interval prior to skin puncture. ANESTHESIA/SEDATION: Moderate (conscious) sedation was employed during this procedure. A total of Versed 1 mg and Fentanyl 75 mcg was administered intravenously. Moderate Sedation Time: 12 minutes. The patient's level of consciousness and vital signs were monitored continuously by radiology nursing throughout the procedure under my direct supervision. FLUOROSCOPY TIME:  Radiation exposure index: 1 mGy reference air kerma COMPLICATIONS: None immediate. PROCEDURE: Informed written consent was obtained from the patient after a thorough discussion of the procedural risks, benefits and alternatives. All questions were addressed. Maximal Sterile Barrier Technique was utilized including caps, mask, sterile gowns, sterile gloves, sterile drape, hand hygiene and skin antiseptic. A timeout was performed prior to the initiation of the procedure. The existing right IJ temporary hemodialysis catheter was completely prepped and draped. A 0.035 wire was advanced through the central line lumen of the tunneled catheter, through the right atrium and into the inferior vena cava. Local anesthesia at the catheter insertion site was attained by infiltration with 1% lidocaine. Additionally, a suitable skin exit site inferior to the clavicle was anesthetized with 1% lidocaine. A small dermatotomy was made. Next, a 19 cm palindrome hemodialysis catheter was tunneled from the skin exit site to the dermatotomy where the existing temporary catheter enters the vein. Next, the existing catheter was removed over the wire. A peel-away sheath was then advanced over the wire and into the right heart. The new 19 cm palindrome hemodialysis catheter was then advanced through the peel-away  sheath and the peel-away sheath was  discarded. The catheter tip is in the right atrium. Images were obtained and stored for the medical record. The catheter flushes and aspirates easily. The catheter was flushed, locked with heparinized saline, capped and secured to the skin with 0 Prolene suture. Sterile bandages were applied. IMPRESSION: Successful exchange for a new 19 cm palindrome tunneled hemodialysis catheter. Catheter tips are in the right atrium and the device is ready for immediate use. Electronically Signed   By: Malachy Moan M.D.   On: 09/18/2022 12:46       Jameya Pontiff M.D. [facility] 09/20/2022, 10:17 AM  Available via Epic secure chat 7am-7pm After 7 pm, please refer to night coverage provider listed on amion.

## 2022-09-20 NOTE — Plan of Care (Signed)
  Problem: Education: Goal: Ability to describe self-care measures that may prevent or decrease complications (Diabetes Survival Skills Education) will improve Outcome: Progressing   Problem: Education: Goal: Knowledge of disease or condition will improve Outcome: Progressing   Problem: Coping: Goal: Ability to adjust to condition or change in health will improve Outcome: Not Progressing   Problem: Skin Integrity: Goal: Risk for impaired skin integrity will decrease Outcome: Not Progressing   Problem: Education: Goal: Knowledge of secondary prevention will improve (MUST DOCUMENT ALL) Outcome: Not Progressing Goal: Knowledge of patient specific risk factors will improve [name] [name] N/A or DELETE if not current risk factor) Outcome: Not Progressing

## 2022-09-20 NOTE — Progress Notes (Signed)
ANTICOAGULATION CONSULT NOTE - Initial Consult  Pharmacy Consult for apixban Indication: atrial fibrillation  Allergies  Allergen Reactions   Procaine Hcl Anaphylaxis   Amoxicillin Itching    Patient Measurements: Height: 5\' 9"  (175.3 cm) Weight: 81.2 kg (179 lb 0.2 oz) IBW/kg (Calculated) : 66.2   Vital Signs: Temp: 98.6 F (37 C) (07/21 0900) Temp Source: Oral (07/21 0900) BP: 135/66 (07/21 0900) Pulse Rate: 80 (07/21 0900)  Labs: Recent Labs    09/18/22 1040 09/19/22 1101 09/20/22 0425  HGB 8.2* 8.2* 8.1*  HCT 26.5* 26.6* 26.2*  PLT  --  272 268  CREATININE  --  2.12* 2.35*    Estimated Creatinine Clearance: 21.8 mL/min (A) (by C-G formula based on SCr of 2.35 mg/dL (H)).   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   HYPERLIPIDEMIA 03/27/2006   HYPERTENSION 03/27/2006   Intertrochanteric fracture of right hip (HCC) 07/13/2012   LUNG NODULE 09/01/2006   Excision, Bx Benighn   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 Take with a full glass of water on an empty stomach.   Past Week   amiodarone (PACERONE) 200 MG tablet Take 1 tablet (200 mg total) by mouth daily. 30 tablet 0 09/05/2022   apixaban (ELIQUIS) 2.5 MG TABS tablet Take 1 tablet (2.5 mg total) by mouth 2 (two) times daily. To start likely on Friday 6/14 if recommended by Coumadin clinic after labs done 180 tablet 3 09/05/2022 at 0800   atorvastatin (LIPITOR) 80 MG tablet Take 1 tablet (80 mg total) by mouth daily. 90 tablet 1 09/05/2022   ezetimibe (ZETIA) 10 MG tablet Take 1 tablet (10 mg total) by mouth daily. 90 tablet 2 09/05/2022   hydrocortisone 2.5 % cream Apply topically 2 (two) times daily. 30 g 0 09/05/2022   insulin NPH-regular Human (NOVOLIN 70/30 RELION) (70-30) 100 UNIT/ML injection 10 units in the morning and 5 units at night. (Patient taking differently: Inject 5-23 Units into the skin daily.)   09/05/2022   metoprolol succinate (TOPROL-XL) 50 MG 24 hr tablet TAKE 1 TABLET BY MOUTH ONCE DAILY . APPOINTMENT REQUIRED FOR FUTURE REFILLS (Patient taking differently: Take 50 mg by mouth daily.) 90 tablet 0 09/05/2022   Multiple Vitamins-Minerals (MULTIVITAMIN ADULT PO) Take 1 tablet by mouth daily with breakfast.    09/05/2022   VITAMIN D, CHOLECALCIFEROL, PO Take 1 tablet by mouth daily.   09/05/2022  furosemide (LASIX) 40 MG tablet Take 1 tablet (40 mg total) by mouth daily as needed for fluid or edema. (Patient not taking: Reported on 09/06/2022)   Not Taking   Nitroglycerin 0.4 % OINT Apply 1 ribbon of ointment anally every 12 hours for anal pain. (Patient not taking: Reported on 09/06/2022) 30 g 0 Not Taking    Assessment: 82 yo female with acute CVA on apixaban PTA. Anticoagulation has been on hold due to low hg (6.4 on 7/15) and plans are to resume today -hg= 8.1, plt= 268 -SCr 2.35  Goal of Therapy:  Monitor platelets by anticoagulation protocol: Yes   Plan:  -resume apixaban 2.5mg  po bid (home dose), first dose tonight  Harland German, PharmD Clinical Pharmacist **Pharmacist phone directory can now be found on [email or web address] ([email or web address]).  Listed under [facility].

## 2022-09-21 DIAGNOSIS — E1165 Type 2 diabetes mellitus with hyperglycemia: Secondary | ICD-10-CM | POA: Diagnosis not present

## 2022-09-21 DIAGNOSIS — N170 Acute kidney failure with tubular necrosis: Secondary | ICD-10-CM | POA: Diagnosis not present

## 2022-09-21 DIAGNOSIS — I639 Cerebral infarction, unspecified: Secondary | ICD-10-CM | POA: Diagnosis not present

## 2022-09-21 DIAGNOSIS — N3001 Acute cystitis with hematuria: Secondary | ICD-10-CM | POA: Diagnosis not present

## 2022-09-21 LAB — BASIC METABOLIC PANEL
Anion gap: 5 (ref 5–15)
BUN: 35 mg/dL — ABNORMAL HIGH (ref 8–23)
CO2: 27 mmol/L (ref 22–32)
Calcium: 7.8 mg/dL — ABNORMAL LOW (ref 8.9–10.3)
Chloride: 99 mmol/L (ref 98–111)
Creatinine, Ser: 2.9 mg/dL — ABNORMAL HIGH (ref 0.44–1.00)
GFR, Estimated: 16 mL/min — ABNORMAL LOW (ref >60)
Glucose, Bld: 209 mg/dL — ABNORMAL HIGH (ref 70–99)
Potassium: 3.7 mmol/L (ref 3.5–5.1)
Sodium: 131 mmol/L — ABNORMAL LOW (ref 135–145)

## 2022-09-21 LAB — CBC
HCT: 25.2 % — ABNORMAL LOW (ref 36.0–46.0)
Hemoglobin: 7.8 g/dL — ABNORMAL LOW (ref 12.0–15.0)
MCH: 30.5 pg (ref 26.0–34.0)
MCHC: 31 g/dL (ref 30.0–36.0)
MCV: 98.4 fL (ref 80.0–100.0)
Platelets: 289 10*3/uL (ref 150–400)
RBC: 2.56 MIL/uL — ABNORMAL LOW (ref 3.87–5.11)
RDW: 22.3 % — ABNORMAL HIGH (ref 11.5–15.5)
WBC: 11.5 10*3/uL — ABNORMAL HIGH (ref 4.0–10.5)
nRBC: 0 % (ref 0.0–0.2)

## 2022-09-21 LAB — GLUCOSE, CAPILLARY
Glucose-Capillary: 224 mg/dL — ABNORMAL HIGH (ref 70–99)
Glucose-Capillary: 112 mg/dL — ABNORMAL HIGH (ref 70–99)
Glucose-Capillary: 191 mg/dL — ABNORMAL HIGH (ref 70–99)
Glucose-Capillary: 260 mg/dL — ABNORMAL HIGH (ref 70–99)

## 2022-09-21 MED ORDER — INSULIN GLARGINE-YFGN 100 UNIT/ML ~~LOC~~ SOLN
3.0000 [IU] | Freq: Every day | SUBCUTANEOUS | Status: DC
  Administered 2022-09-21 – 2022-09-23 (×3): 3 [IU] via SUBCUTANEOUS
  Filled 2022-09-21 (×4): qty 0.03

## 2022-09-21 MED ORDER — PANTOPRAZOLE SODIUM 40 MG PO TBEC
40.0000 mg | DELAYED_RELEASE_TABLET | Freq: Every day | ORAL | Status: DC
  Administered 2022-09-21 – 2022-09-24 (×4): 40 mg via ORAL
  Filled 2022-09-21 (×4): qty 1

## 2022-09-21 NOTE — Inpatient Diabetes Management (Signed)
Inpatient Diabetes Program Recommendations  AACE/ADA: New Consensus Statement on Inpatient Glycemic Control   Target Ranges:  Prepandial:   less than 140 mg/dL      Peak postprandial:   less than 180 mg/dL (1-2 hours)      Critically ill patients:  140 - 180 mg/dL    Latest Reference Range & Units 09/20/22 05:38 09/20/22 11:24 09/20/22 16:37 09/20/22 21:09 09/21/22 06:11  Glucose-Capillary 70 - 99 mg/dL 604 (H) 540 (H) 981 (H) 292 (H) 224 (H)   Review of Glycemic Control  Diabetes history: DM2 Outpatient Diabetes medications: 70/30 23 units QAM, 70/30 5 units QPM Current orders for Inpatient glycemic control: Novolog 0-6 units TID  Inpatient Diabetes Program Recommendations:    Insulin: May want to consider ordering Semglee 3 units Q24H.  Thanks, Orlando Penner, RN, MSN, CDCES Diabetes Coordinator Inpatient Diabetes Program [phone] (Team Pager from 8am to 5pm)

## 2022-09-21 NOTE — Progress Notes (Addendum)
[facility]                                                                              [place], is a 82 y.o. female, DOB - Mar 09, 1940, [id number] Admit date - 09/06/2022    Outpatient Primary MD for the patient is [name], MD  LOS - 15  days  Chief Complaint  Patient presents with   Weakness   Dizziness       Brief summary   Patient is a 82 y.o. F with CKD IV baseline ~1.7-2.2, DM, sCHF EF 35-40%, hx HSV encephalitis 2014, pAF on Eliquis, seizures, hx ESBL UTI who presented with dizziness, found to have stroke, acute renal failure and ESBL bacteremia.  Significant events: 7/7: Admitted for stroke, on antibiotics/gentle IVF; Neuro consulted 7/8: Nephrology consulted; BCx growing Klebsiella 7/10: Cr worse, IVF restarted 7/14: Hgb down to 6, transfused 2 units 7/15: Vascath placed, underwent first HD, required 3rd unit PRBCs 7/16: Hgb again <7, required 4th unit PRBC 7/17: HD #2 7/19: Tunneled line placed, HD#3     Significant studies: 7/7: CXR -- perihilar opacities, c/w infection or edema 7/7: MRI brain/MRA H&N -- small R postcentral gyrus infarct, no significant vessel disease  7/8: US renal -- unchanged mild L hydro, 1.5 cm cyst     Significant microbiology data: 7/7 BCx x2: ESBL Klebsiella in 2/2 7/7 UCx: ESBL Klebsiella 7/8 BCx x2: ESBL in 1/3 (although this was drawn in very close proximity to meropenem) 7/13 BCx x2: MRSE in 1/3 (likely contaminant)     Procedures: 7/15: Vascath placed, first HD 7/17: HD#2 7/19: Tunnled line placed, HD#3    Assessment & Plan    Principal Problem:   Acute renal failure superimposed on stage 4 chronic kidney disease (HCC), 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  [place] without clinically significant disease.   - likely ischemic ATN in setting of ESBL bacteremia and CHF and chronic kidney disease.  -Improved now after 3 sessions of HD, uremia is better. - Hemodialysis per  nephrology -Continue MWF schedule for now, started CLIP process for outpatient HD center - no acute issues       Active problems ESBL Bacteremia due to UTI -Found on admission to have bacteremia, cultures positive for ESBL. -Completed 7 days Meropenem        Acute CVA (cerebrovascular accident) (HCC) -MRI brain on admission showed right post-central gyrus infarct - Non-invasive angiography showed no significant disease - Echocardiogram completed 1 month ago showed no cardiogenic source of embolism - Lipids ordered: LDL undetectable - Continue Lipitor, Zetia - Aspirin deferred in place of Eliquis - Afib known, on amiodarone, Eliquis resumed. - tPA not given because outside window - Dysphagia screen completed - PT eval ordered: recommend SNF     Anemia in chronic kidney disease (CKD) - Iron studies normal.  Hemoglobin trended down and required PRBC transfusion x4 last week.   - Eliquis was placed on hold.  Patient was seen by GI on 7/19, recommended supportive care, no therapeutic interventions unless signs of GI hemorrhage -Eliquis has been resumed, follow H&H.  Hemoglobin slightly down  to 7.8 today -If continues to trend down or signs of hematochezia, melena or hematemesis, will discontinue Eliquis and reconsult GI      Chronic systolic CHF (congestive heart failure) (HCC) - Volume management with HD    Paroxysmal atrial fibrillation (HCC) - Rate controlled, continue amiodarone - Metoprolol has been held due to orthostasis - Resumed Eliquis   CAD S/P percutaneous coronary angioplasty - Continue atorvastatin, Zetia - Resumed Eliquis.  - Metoprolol currently held due to orthostasis   Diabetes (HCC) - Hold NPH  CBG (last 3)  Recent Labs    09/20/22 2109 09/21/22 0611 09/21/22 1219  GLUCAP 292* 224* 112*   CBGs elevated, added Semglee 3 units at bedtime, continue sensitive SSI    Seizure disorder (HCC) No longer on AED   Essential hypertension BP  orthostatic - Hold metoprolol   Hyperlipidemia - Continue Lipitor and Zetia     Estimated body mass index is 26.5 kg/m as calculated from the following:   Height as of this encounter: 5\' 9"  (1.753 m).   Weight as of this encounter: 81.4 kg.  Code Status: DNR DVT Prophylaxis:  apixaban (ELIQUIS) tablet 2.5 mg Start: 09/20/22 2200Resume Eliquis apixaban (ELIQUIS) tablet 2.5 mg   Level of Care: Level of care: Med-Surg Family Communication:  Disposition Plan:      Remains inpatient appropriate: Workup in progress, needs outpatient HD center arranged   Consultants:   Neurology Nephrology Gastroenterology  Antimicrobials:   Anti-infectives (From admission, onward)    Start     Dose/Rate Route Frequency Ordered Stop   09/18/22 1215  ceFAZolin (ANCEF) IVPB 2g/100 mL premix        2 g 200 mL/hr over 30 Minutes Intravenous On call 09/18/22 1124 09/18/22 1300   09/06/22 1400  meropenem (MERREM) 1 g in sodium chloride 0.9 % 100 mL IVPB        1 g 200 mL/hr over 30 Minutes Intravenous Every 24 hours 09/06/22 1254 09/14/22 0559          Medications  amiodarone  200 mg Oral Daily   apixaban  2.5 mg Oral BID   atorvastatin  40 mg Oral Daily   Chlorhexidine Gluconate Cloth  6 each Topical Q0600   darbepoetin (ARANESP) injection - NON-DIALYSIS  60 mcg Subcutaneous Q Sat-1800   ezetimibe  10 mg Oral Daily   feeding supplement (NEPRO CARB STEADY)  237 mL Oral Q24H   heparin sodium (porcine)  10 mL Intracatheter Once   hydrocortisone cream   Topical TID   insulin aspart  0-6 Units Subcutaneous TID WC   insulin glargine-yfgn  3 Units Subcutaneous QHS   pantoprazole  40 mg Oral Daily   polyethylene glycol  17 g Oral Daily   witch hazel-glycerin   Topical TID      Subjective:   [name] was seen and examined today.  Seen during hemodialysis, no acute complaints.  No bleeding.  No fever or chills, chest pain or shortness of breath.   Objective:   Vitals:   09/21/22  1130 09/21/22 1144 09/21/22 1145 09/21/22 1223  BP: 124/73 127/68 127/68 (!) 135/56  Pulse: (!) 113 76 71 87  Resp: 20 (!) 21 20 18   Temp:   97.9 F (36.6 C) 97.7 F (36.5 C)  TempSrc:    Oral  SpO2: 96% 95% 96% 95%  Weight:      Height:        Intake/Output Summary (Last 24 hours) at 09/21/2022 1332 Last data  filed at 09/21/2022 1223 Gross per 24 hour  Intake 910 ml  Output 2450 ml  Net -1540 ml     Wt Readings from Last 3 Encounters:  09/21/22 81.4 kg  08/25/22 75.7 kg  08/12/22 79.8 kg   Physical Exam General: Alert and oriented x 3, NAD Cardiovascular: S1 S2 clear, RRR.  Respiratory: CTAB, no wheezing Gastrointestinal: Soft, nontender, nondistended, NBS Ext: 1+ pedal edema bilaterally Neuro: no new deficits Psych: Normal affect   Data Reviewed:  I have personally reviewed following labs    CBC Lab Results  Component Value Date   WBC 11.5 (H) 09/21/2022   RBC 2.56 (L) 09/21/2022   HGB 7.8 (L) 09/21/2022   HCT 25.2 (L) 09/21/2022   MCV 98.4 09/21/2022   MCH 30.5 09/21/2022   PLT 289 09/21/2022   MCHC 31.0 09/21/2022   RDW 22.3 (H) 09/21/2022   LYMPHSABS 0.5 (L) 09/06/2022   MONOABS 0.5 09/06/2022   EOSABS 0.3 09/06/2022   BASOSABS 0.0 09/06/2022     Last metabolic panel Lab Results  Component Value Date   NA 131 (L) 09/21/2022   K 3.7 09/21/2022   CL 99 09/21/2022   CO2 27 09/21/2022   BUN 35 (H) 09/21/2022   CREATININE 2.90 (H) 09/21/2022   GLUCOSE 209 (H) 09/21/2022   GFRNONAA 16 (L) 09/21/2022   GFRAA 26 (L) 07/20/2018   CALCIUM 7.8 (L) 09/21/2022   PHOS 4.1 09/16/2022   PROT 5.1 (L) 09/20/2022   ALBUMIN <1.5 (L) 09/20/2022   LABGLOB 3.7 09/07/2022   AGRATIO 0.4 (L) 09/07/2022   BILITOT 0.5 09/20/2022   ALKPHOS 74 09/20/2022   AST 26 09/20/2022   ALT 9 09/20/2022   ANIONGAP 5 09/21/2022    CBG (last 3)  Recent Labs    09/20/22 2109 09/21/22 0611 09/21/22 1219  GLUCAP 292* 224* 112*      Coagulation Profile: No results  for input(s): "INR", "PROTIME" in the last 168 hours.   Radiology Studies: I have personally reviewed the imaging studies  No results found.     Thad Ranger M.D. [facility] 09/21/2022, 1:32 PM  Available via Epic secure chat 7am-7pm After 7 pm, please refer to night coverage provider listed on amion.

## 2022-09-21 NOTE — Procedures (Signed)
I was present at this dialysis session. I have reviewed the session itself and made appropriate changes.   Filed Weights   09/20/22 0527 09/21/22 0500 09/21/22 0749  Weight: 81.2 kg 87.2 kg 81.4 kg    Recent Labs  Lab 09/16/22 0717 09/17/22 0112 09/21/22 0500  NA 136   < > 131*  K 4.5   < > 3.7  CL 105   < > 99  CO2 25   < > 27  GLUCOSE 122*   < > 209*  BUN 109*   < > 35*  CREATININE 3.85*   < > 2.90*  CALCIUM 8.2*   < > 7.8*  PHOS 4.1  --   --    < > = values in this interval not displayed.    Recent Labs  Lab 09/19/22 1101 09/20/22 0425 09/21/22 0500  WBC 12.2* 11.1* 11.5*  HGB 8.2* 8.1* 7.8*  HCT 26.6* 26.2* 25.2*  MCV 95.3 96.3 98.4  PLT 272 268 289    Scheduled Meds:  amiodarone  200 mg Oral Daily   apixaban  2.5 mg Oral BID   atorvastatin  40 mg Oral Daily   Chlorhexidine Gluconate Cloth  6 each Topical Q0600   darbepoetin (ARANESP) injection - NON-DIALYSIS  60 mcg Subcutaneous Q Sat-1800   ezetimibe  10 mg Oral Daily   feeding supplement (NEPRO CARB STEADY)  237 mL Oral Q24H   heparin sodium (porcine)  10 mL Intracatheter Once   hydrocortisone cream   Topical TID   insulin aspart  0-6 Units Subcutaneous TID WC   pantoprazole (PROTONIX) IV  40 mg Intravenous Q24H   polyethylene glycol  17 g Oral Daily   witch hazel-glycerin   Topical TID   Continuous Infusions: PRN Meds:.acetaminophen, mouth rinse, oxyCODONE, senna-docusate   Louie Bun,  MD 09/21/2022, 12:18 PM

## 2022-09-21 NOTE — Plan of Care (Signed)
  Problem: Nutritional: Goal: Maintenance of adequate nutrition will improve Outcome: Progressing Goal: Progress toward achieving an optimal weight will improve Outcome: Progressing   Problem: Skin Integrity: Goal: Risk for impaired skin integrity will decrease Outcome: Progressing   Problem: Education: Goal: Knowledge of disease or condition will improve Outcome: Progressing Goal: Knowledge of secondary prevention will improve (MUST DOCUMENT ALL) Outcome: Progressing Goal: Knowledge of patient specific risk factors will improve Loraine Leriche N/A or DELETE if not current risk factor) Outcome: Progressing   Problem: Ischemic Stroke/TIA Tissue Perfusion: Goal: Complications of ischemic stroke/TIA will be minimized Outcome: Progressing   Problem: Self-Care: Goal: Ability to participate in self-care as condition permits will improve Outcome: Progressing Goal: Verbalization of feelings and concerns over difficulty with self-care will improve Outcome: Progressing   Problem: Education: Goal: Knowledge of General Education information will improve Description: Including pain rating scale, medication(s)/side effects and non-pharmacologic comfort measures Outcome: Progressing   Problem: Health Behavior/Discharge Planning: Goal: Ability to manage health-related needs will improve Outcome: Progressing

## 2022-09-21 NOTE — Progress Notes (Signed)
[facility] Progress Note   Subjective:    Seen on HD today. No complaints. Continues to have small amount of UOP; 300 in last 24 hours is documented.  Objective Vitals:   09/21/22 0500 09/21/22 0729 09/21/22 0749 09/21/22 0755  BP:  (!) 146/63 (!) 154/66 (!) 147/63  Pulse:  83 87 87  Resp:  16 (!) 22 (!) 21  Temp:  98.8 F (37.1 C) 97.9 F (36.6 C)   TempSrc:  Oral    SpO2:  94% 95% 96%  Weight: 87.2 kg  81.4 kg   Height:       Physical Exam GEN: lying in bed, nad ENT: no nasal discharge, mmm EYES: no scleral icterus, eomi CV: normal rate PULM: no iwob, bilateral chest rise ABD: NABS, non-distended SKIN: no rashes or jaundice EXT: 1+ edema in ble, warm and well perfused R internal jugular TDC bandaged  Additional Objective Labs: Basic Metabolic Panel: Recent Labs  Lab 09/15/22 0432 09/16/22 0717 09/17/22 0112 09/19/22 1101 09/20/22 0425  NA 134* 136 135 134* 132*  K 4.6 4.5 4.0 3.6 3.7  CL 101 105 101 98 100  CO2 24 25 25 27 26   GLUCOSE 149* 122* 155* 166* 192*  BUN 100* 109* 61* 29* 32*  CREATININE 3.58* 3.85* 2.75* 2.12* 2.35*  CALCIUM 7.8* 8.2* 7.9* 7.7* 7.5*  PHOS 3.5 4.1  --   --   --    Liver Function Tests: Recent Labs  Lab 09/16/22 0717 09/17/22 0112 09/20/22 0425  AST  --  36 26  ALT  --  14 9  ALKPHOS  --  64 74  BILITOT  --  0.8 0.5  PROT  --  4.6* 5.1*  ALBUMIN <1.5* <1.5* <1.5*   No results for input(s): "LIPASE", "AMYLASE" in the last 168 hours. CBC: Recent Labs  Lab 09/15/22 0432 09/16/22 0717 09/17/22 0112 09/18/22 1040 09/19/22 1101 09/20/22 0425  WBC 17.0* 15.7* 13.3*  --  12.2* 11.1*  HGB 6.9* 7.5* 7.5* 8.2* 8.2* 8.1*  HCT 21.0* 22.9* 24.0* 26.5* 26.6* 26.2*  MCV 87.9 89.1 93.8  --  95.3 96.3  PLT 264 237 219  --  272 268   Blood Culture    Component Value Date/Time   SDES BLOOD RIGHT HAND 09/12/2022 1434   SDES BLOOD LEFT HAND 09/12/2022 1434   SPECREQUEST  09/12/2022 1434    BOTTLES DRAWN  AEROBIC AND ANAEROBIC Blood Culture adequate volume   SPECREQUEST  09/12/2022 1434    BOTTLES DRAWN AEROBIC AND ANAEROBIC Blood Culture results may not be optimal due to an excessive volume of blood received in culture bottles   CULT  09/12/2022 1434    NO GROWTH 5 DAYS Performed at [facility], [address], [place], [place] [address]    CULT (A) 09/12/2022 1434    STAPHYLOCOCCUS EPIDERMIDIS THE SIGNIFICANCE OF ISOLATING THIS ORGANISM FROM A SINGLE SET OF BLOOD CULTURES WHEN MULTIPLE SETS ARE DRAWN IS UNCERTAIN. PLEASE NOTIFY THE MICROBIOLOGY DEPARTMENT WITHIN ONE WEEK IF SPECIATION AND SENSITIVITIES ARE REQUIRED. Performed at [facility], [address] [place], [place] [address]    REPTSTATUS 09/17/2022 FINAL 09/12/2022 1434   REPTSTATUS 09/15/2022 FINAL 09/12/2022 1434    Cardiac Enzymes: No results for input(s): "CKTOTAL", "CKMB", "CKMBINDEX", "TROPONINI" in the last 168 hours. CBG: Recent Labs  Lab 09/20/22 0538 09/20/22 1124 09/20/22 1637 09/20/22 2109 09/21/22 0611  GLUCAP 175* 131* 280* 292* 224*   Iron Studies:  No results  for input(s): "IRON", "TIBC", "TRANSFERRIN", "FERRITIN" in the last 72 hours.  @lablastinr3 @ Studies/Results: No results found. Medications:    amiodarone  200 mg Oral Daily   apixaban  2.5 mg Oral BID   atorvastatin  40 mg Oral Daily   Chlorhexidine Gluconate Cloth  6 each Topical Q0600   darbepoetin (ARANESP) injection - NON-DIALYSIS  60 mcg Subcutaneous Q Sat-1800   ezetimibe  10 mg Oral Daily   feeding supplement (NEPRO CARB STEADY)  237 mL Oral Q24H   heparin sodium (porcine)  10 mL Intracatheter Once   hydrocortisone cream   Topical TID   insulin aspart  0-6 Units Subcutaneous TID WC   pantoprazole (PROTONIX) IV  40 mg Intravenous Q24H   polyethylene glycol  17 g Oral Daily   witch hazel-glycerin   Topical TID    Assessment/PlanVirginia E [name] is an 82 y.o. female atrial fibrillation, DM type 2, HFrEF, HTN, HL,   currently admitted for ESBL E coli UTI and CVA for whom nephrology is consulted for evaluation and management of AKI on CKD.    **AKI on CKD:  baseline Cr was in mid 2s > 08/2022 admission presenting with Cr 4.55 and developed uremic.  No nephrology care outpt.  UA consistent with UTI.  Imaging with no new findings - do not suspect mild hydro on L significantly contributing.  Modest hypotension noted - suspect ATN related to acute infection + hypovolemia.  S/p IV fluid challenge and kidney function continued to worsen and develooped uremic symptoms.   Temp R internal jugular HD cath placed 7/15; tunneled 7/19 HD #1 7/15 Cont on MWF schedule for now, no heparin Started CLIP as AKI In process   **acute CVA: post central gyrus, neurology following.  PT/OT have been consulted.    **Hyponatremia:  mild, stable, HD addressing  **Hyperkalemia: improved / resolved with HD   **Anemia, normocytic:  Baseline in the past few months 8-9s.  Dropped this AM from 7-8s to 5.8 with report of dark stools. Iron sat 11% - s/p  IV. FE.  Started ESA 7/13.  Transfusion per primary.    **HFrEF:  EF 35% 08/2022.  UF with HD   **UTI: ESBL Klebsiella, sens only to imipenem. Finished ABX   **R renal hypoechoic structure:  rads rec 75mo f/u US ensure stable   **DM: insulin per primary.    **A fib:  on  BB, amiodarone.  Holding eliquis in light of poss GIB   Will follow, call with concerns.   Darnell Level, MD  09/21/2022, 8:21 AM  [facility]

## 2022-09-21 NOTE — Progress Notes (Signed)
   09/21/22 1145  Vitals  Temp 97.9 F (36.6 C)  Pulse Rate 71  Resp 20  BP 127/68  SpO2 96 %  O2 Device Room Air  Oxygen Therapy  Patient Activity (if Appropriate) In bed  Post Treatment  Dialyzer Clearance Clear  Duration of HD Treatment -hour(s) 3.5 hour(s)  Hemodialysis Intake (mL) 0 mL  Liters Processed 84  Fluid Removed (mL) 2000 mL  Tolerated HD Treatment Yes   Received patient in bed to unit.  Alert and oriented.  Informed consent signed and in chart.   TX duration:3.5hrs  Patient tolerated well.  Transported back to the room  Alert, without acute distress.  Hand-off given to patient's nurse.   Access used: [facility] Access issues: none  Total UF removed: 2L Medication(s) given: none   [facility]

## 2022-09-22 ENCOUNTER — Ambulatory Visit (HOSPITAL_BASED_OUTPATIENT_CLINIC_OR_DEPARTMENT_OTHER): Payer: Medicare Other | Admitting: Family

## 2022-09-22 DIAGNOSIS — I4891 Unspecified atrial fibrillation: Secondary | ICD-10-CM | POA: Diagnosis not present

## 2022-09-22 DIAGNOSIS — N184 Chronic kidney disease, stage 4 (severe): Secondary | ICD-10-CM | POA: Diagnosis not present

## 2022-09-22 DIAGNOSIS — I5022 Chronic systolic (congestive) heart failure: Secondary | ICD-10-CM | POA: Diagnosis not present

## 2022-09-22 DIAGNOSIS — N179 Acute kidney failure, unspecified: Secondary | ICD-10-CM | POA: Diagnosis not present

## 2022-09-22 LAB — BASIC METABOLIC PANEL
Anion gap: 8 (ref 5–15)
BUN: 19 mg/dL (ref 8–23)
CO2: 30 mmol/L (ref 22–32)
Calcium: 8 mg/dL — ABNORMAL LOW (ref 8.9–10.3)
Chloride: 95 mmol/L — ABNORMAL LOW (ref 98–111)
Creatinine, Ser: 2.13 mg/dL — ABNORMAL HIGH (ref 0.44–1.00)
GFR, Estimated: 23 mL/min — ABNORMAL LOW (ref >60)
Glucose, Bld: 250 mg/dL — ABNORMAL HIGH (ref 70–99)
Potassium: 4 mmol/L (ref 3.5–5.1)
Sodium: 133 mmol/L — ABNORMAL LOW (ref 135–145)

## 2022-09-22 LAB — CBC
HCT: 26.2 % — ABNORMAL LOW (ref 36.0–46.0)
Hemoglobin: 8.2 g/dL — ABNORMAL LOW (ref 12.0–15.0)
MCH: 30.5 pg (ref 26.0–34.0)
MCHC: 31.3 g/dL (ref 30.0–36.0)
MCV: 97.4 fL (ref 80.0–100.0)
Platelets: 296 10*3/uL (ref 150–400)
RBC: 2.69 MIL/uL — ABNORMAL LOW (ref 3.87–5.11)
RDW: 21.9 % — ABNORMAL HIGH (ref 11.5–15.5)
WBC: 12.6 10*3/uL — ABNORMAL HIGH (ref 4.0–10.5)
nRBC: 0 % (ref 0.0–0.2)

## 2022-09-22 LAB — GLUCOSE, CAPILLARY
Glucose-Capillary: 187 mg/dL — ABNORMAL HIGH (ref 70–99)
Glucose-Capillary: 218 mg/dL — ABNORMAL HIGH (ref 70–99)
Glucose-Capillary: 212 mg/dL — ABNORMAL HIGH (ref 70–99)
Glucose-Capillary: 170 mg/dL — ABNORMAL HIGH (ref 70–99)

## 2022-09-22 NOTE — Progress Notes (Signed)
Speech Language Pathology Treatment: Cognitive-Linquistic  Patient Details Name: [name] [name] MRN: [id number] DOB: 08-01-1940 Today's Date: 09/22/2022 Time: 9528-4132 SLP Time Calculation (min) (ACUTE ONLY): 14 min  Assessment / Plan / Recommendation Clinical Impression  Pt much more alert and willing to participate during today's session. She reports having some ongoing changes in memory PTA which she attributes to age. She demonstrated increased awareness of current physical deficits and emerging anticipation of needs when ambulating with PT/OT. She completed a complex multistep (3 step and sequential) commands activity with 100% accuracy independently. Her willingness to participate and performance on cognitive tasks appears to fluctuate between each session, although when maximally involved, pt has performance considered WFL. SLP will continue to f/u with less frequent intensity to continue targeting memory and attention.    HPI HPI: Pt is an 82 yo female presenting from home to ED with weakness, dizziness, difficulty ambulating x 2 months, and suspected recurrent ESBL UTI. MRI Brain revealed small acute infarct in R postcentral gyrus and large area of L temporal encephalomalacia correlating with history of remote herpes encephalitis. PMH includes afib, CKD stage III, insulin-dependent DM2, CHF with reduced EF, HTN, HLD      SLP Plan  Continue with current plan of care      Recommendations for follow up therapy are one component of a multi-disciplinary discharge planning process, led by the attending physician.  Recommendations may be updated based on patient status, additional functional criteria and insurance authorization.    Recommendations                     Oral care BID   Frequent or constant Supervision/Assistance Cognitive communication deficit (G40.102)     Continue with current plan of care     Gwynneth Aliment, M.A., CF-SLP Speech Language Pathology, Acute  Rehabilitation Services  Secure Chat preferred [phone]   09/22/2022, 3:33 PM

## 2022-09-22 NOTE — Progress Notes (Signed)
[facility] Progress Note   Subjective:    No complaints HD session on Mon w/ no issues  Objective Vitals:   09/21/22 2017 09/22/22 0500 09/22/22 0623 09/22/22 0750  BP: (!) 133/55  123/67 128/68  Pulse: 96  78 86  Resp: 18  19 18   Temp: 97.6 F (36.4 C)  (!) 97 F (36.1 C) (!) 97.4 F (36.3 C)  TempSrc: Oral  Axillary Oral  SpO2: 99%  99% 94%  Weight:  81.1 kg    Height:       Physical Exam ZOX:WRUEAVW in chair, nad ENT: no nasal discharge, mmm EYES: no scleral icterus, eomi CV: normal rate PULM: no iwob, bilateral chest rise ABD: NABS, non-distended SKIN: no rashes or jaundice EXT: 1+ edema in ble, warm and well perfused R internal jugular TDC bandaged  Additional Objective Labs: Basic Metabolic Panel: Recent Labs  Lab 09/16/22 0717 09/17/22 0112 09/20/22 0425 09/21/22 0500 09/21/22 2341  NA 136   < > 132* 131* 133*  K 4.5   < > 3.7 3.7 4.0  CL 105   < > 100 99 95*  CO2 25   < > 26 27 30   GLUCOSE 122*   < > 192* 209* 250*  BUN 109*   < > 32* 35* 19  CREATININE 3.85*   < > 2.35* 2.90* 2.13*  CALCIUM 8.2*   < > 7.5* 7.8* 8.0*  PHOS 4.1  --   --   --   --    < > = values in this interval not displayed.   Liver Function Tests: Recent Labs  Lab 09/16/22 0717 09/17/22 0112 09/20/22 0425  AST  --  36 26  ALT  --  14 9  ALKPHOS  --  64 74  BILITOT  --  0.8 0.5  PROT  --  4.6* 5.1*  ALBUMIN <1.5* <1.5* <1.5*   No results for input(s): "LIPASE", "AMYLASE" in the last 168 hours. CBC: Recent Labs  Lab 09/17/22 0112 09/18/22 1040 09/19/22 1101 09/20/22 0425 09/21/22 0500 09/21/22 2341  WBC 13.3*  --  12.2* 11.1* 11.5* 12.6*  HGB 7.5*   < > 8.2* 8.1* 7.8* 8.2*  HCT 24.0*   < > 26.6* 26.2* 25.2* 26.2*  MCV 93.8  --  95.3 96.3 98.4 97.4  PLT 219  --  272 268 289 296   < > = values in this interval not displayed.   Blood Culture    Component Value Date/Time   SDES BLOOD RIGHT HAND 09/12/2022 1434   SDES BLOOD LEFT HAND  09/12/2022 1434   SPECREQUEST  09/12/2022 1434    BOTTLES DRAWN AEROBIC AND ANAEROBIC Blood Culture adequate volume   SPECREQUEST  09/12/2022 1434    BOTTLES DRAWN AEROBIC AND ANAEROBIC Blood Culture results may not be optimal due to an excessive volume of blood received in culture bottles   CULT  09/12/2022 1434    NO GROWTH 5 DAYS Performed at [facility], [address] [place], [place] [address]    CULT (A) 09/12/2022 1434    STAPHYLOCOCCUS EPIDERMIDIS THE SIGNIFICANCE OF ISOLATING THIS ORGANISM FROM A SINGLE SET OF BLOOD CULTURES WHEN MULTIPLE SETS ARE DRAWN IS UNCERTAIN. PLEASE NOTIFY THE MICROBIOLOGY DEPARTMENT WITHIN ONE WEEK IF SPECIATION AND SENSITIVITIES ARE REQUIRED. Performed at [facility], [address], [place], [place] [address]    REPTSTATUS 09/17/2022 FINAL 09/12/2022 1434   REPTSTATUS 09/15/2022 FINAL 09/12/2022 1434    Cardiac Enzymes:  No results for input(s): "CKTOTAL", "CKMB", "CKMBINDEX", "TROPONINI" in the last 168 hours. CBG: Recent Labs  Lab 09/21/22 0611 09/21/22 1219 09/21/22 1527 09/21/22 2113 09/22/22 0622  GLUCAP 224* 112* 191* 260* 187*   Iron Studies:  No results for input(s): "IRON", "TIBC", "TRANSFERRIN", "FERRITIN" in the last 72 hours.  @lablastinr3 @ Studies/Results: No results found. Medications:    amiodarone  200 mg Oral Daily   apixaban  2.5 mg Oral BID   atorvastatin  40 mg Oral Daily   Chlorhexidine Gluconate Cloth  6 each Topical Q0600   darbepoetin (ARANESP) injection - NON-DIALYSIS  60 mcg Subcutaneous Q Sat-1800   ezetimibe  10 mg Oral Daily   feeding supplement (NEPRO CARB STEADY)  237 mL Oral Q24H   heparin sodium (porcine)  10 mL Intracatheter Once   hydrocortisone cream   Topical TID   insulin aspart  0-6 Units Subcutaneous TID WC   insulin glargine-yfgn  3 Units Subcutaneous QHS   pantoprazole  40 mg Oral Daily   polyethylene glycol  17 g Oral Daily   witch hazel-glycerin   Topical TID     Assessment/PlanVirginia E [name] is an 82 y.o. female atrial fibrillation, DM type 2, HFrEF, HTN, HL,  currently admitted for ESBL E coli UTI and CVA for whom nephrology is consulted for evaluation and management of AKI on CKD.    **AKI on CKD:  baseline Cr was in mid 2s > 08/2022 admission presenting with Cr 4.55 and developed uremic.  No nephrology care outpt.  UA consistent with UTI.  Imaging with no new findings - do not suspect mild hydro on L significantly contributing.  Modest hypotension noted - suspect ATN related to acute infection + hypovolemia.  S/p IV fluid challenge and kidney function continued to worsen and develooped uremic symptoms.   Temp R internal jugular HD cath placed 7/15; tunneled 7/19 HD #1 7/15 Cont on MWF schedule for now, no heparin Started CLIP as AKI In process   **acute CVA: post central gyrus, neurology following.  PT/OT have been consulted.    **Hyponatremia:  mild, stable, HD addressing  **Hyperkalemia: improved / resolved with HD   **Anemia, normocytic:  Baseline in the past few months 8-9s.  Dropped this AM from 7-8s to 5.8 with report of dark stools. Iron sat 11% - s/p  IV. FE.  Started ESA 7/13.  Transfusion per primary.    **HFrEF:  EF 35% 08/2022.  UF with HD   **UTI: ESBL Klebsiella, sens only to imipenem. Finished ABX   **R renal hypoechoic structure:  rads rec 27mo f/u US ensure stable   **DM: insulin per primary.    **A fib:  on  BB, amiodarone.   Will follow, call with concerns.   Darnell Level, MD  09/22/2022, 9:47 AM  [facility]

## 2022-09-22 NOTE — Progress Notes (Signed)
Palliative Medicine Note  Thank you for consulting the Palliative Medicine Team. Our team will follow this patient intermittently for support and ongoing needs. Hospital course, current plan and goals of care have been reviewed. If this patient's condition or goals of care change please call [phone] or to notify our providers of any acute or new palliative care needs.   Barrie Folk MS Ed.S, RN Palliative Medicine Team  Team Phone: [phone]

## 2022-09-22 NOTE — Plan of Care (Signed)
  Problem: Education: Goal: Ability to describe self-care measures that may prevent or decrease complications (Diabetes Survival Skills Education) will improve Outcome: Progressing Goal: Individualized Educational Video(s) Outcome: Progressing   Problem: Coping: Goal: Ability to adjust to condition or change in health will improve Outcome: Progressing   Problem: Fluid Volume: Goal: Ability to maintain a balanced intake and output will improve Outcome: Progressing   Problem: Health Behavior/Discharge Planning: Goal: Ability to identify and utilize available resources and services will improve Outcome: Progressing Goal: Ability to manage health-related needs will improve Outcome: Progressing   Problem: Metabolic: Goal: Ability to maintain appropriate glucose levels will improve Outcome: Progressing   Problem: Nutritional: Goal: Maintenance of adequate nutrition will improve Outcome: Progressing Goal: Progress toward achieving an optimal weight will improve Outcome: Progressing   Problem: Skin Integrity: Goal: Risk for impaired skin integrity will decrease Outcome: Progressing   Problem: Tissue Perfusion: Goal: Adequacy of tissue perfusion will improve Outcome: Progressing   Problem: Education: Goal: Knowledge of disease or condition will improve Outcome: Progressing Goal: Knowledge of secondary prevention will improve (MUST DOCUMENT ALL) Outcome: Progressing Goal: Knowledge of patient specific risk factors will improve (Mark N/A or DELETE if not current risk factor) Outcome: Progressing   Problem: Ischemic Stroke/TIA Tissue Perfusion: Goal: Complications of ischemic stroke/TIA will be minimized Outcome: Progressing   Problem: Coping: Goal: Will verbalize positive feelings about self Outcome: Progressing Goal: Will identify appropriate support needs Outcome: Progressing   Problem: Health Behavior/Discharge Planning: Goal: Ability to manage health-related needs  will improve Outcome: Progressing Goal: Goals will be collaboratively established with patient/family Outcome: Progressing   Problem: Self-Care: Goal: Ability to participate in self-care as condition permits will improve Outcome: Progressing Goal: Verbalization of feelings and concerns over difficulty with self-care will improve Outcome: Progressing Goal: Ability to communicate needs accurately will improve Outcome: Progressing   Problem: Nutrition: Goal: Risk of aspiration will decrease Outcome: Progressing Goal: Dietary intake will improve Outcome: Progressing   Problem: Education: Goal: Knowledge of General Education information will improve Description: Including pain rating scale, medication(s)/side effects and non-pharmacologic comfort measures Outcome: Progressing   Problem: Health Behavior/Discharge Planning: Goal: Ability to manage health-related needs will improve Outcome: Progressing   Problem: Clinical Measurements: Goal: Ability to maintain clinical measurements within normal limits will improve Outcome: Progressing Goal: Will remain free from infection Outcome: Progressing Goal: Diagnostic test results will improve Outcome: Progressing Goal: Respiratory complications will improve Outcome: Progressing Goal: Cardiovascular complication will be avoided Outcome: Progressing   Problem: Activity: Goal: Risk for activity intolerance will decrease Outcome: Progressing   Problem: Nutrition: Goal: Adequate nutrition will be maintained Outcome: Progressing   Problem: Coping: Goal: Level of anxiety will decrease Outcome: Progressing   Problem: Elimination: Goal: Will not experience complications related to bowel motility Outcome: Progressing Goal: Will not experience complications related to urinary retention Outcome: Progressing   Problem: Pain Managment: Goal: General experience of comfort will improve Outcome: Progressing   Problem: Safety: Goal:  Ability to remain free from injury will improve Outcome: Progressing   Problem: Skin Integrity: Goal: Risk for impaired skin integrity will decrease Outcome: Progressing   

## 2022-09-22 NOTE — Progress Notes (Signed)
Contacted [facility] this morning to inquire if pt has been cleared for admission when stable to d/c to snf. Pt has been accepted on MWF 12:15 chair time. On pt's first day, pt will need to arrive at 11:45 to complete paperwork prior to treatment. Pt's start date will be determined according to pt's d/c date. Update provided to attending, nephrologist, RN CM, and pt's RN. Will assist as needed.   Olivia Canter Renal Navigator [phone]

## 2022-09-22 NOTE — Progress Notes (Signed)
Physical Therapy Treatment  Patient Details Name: [name] [name] BEGIN MRN: [id number] DOB: 05-14-1940 Today's Date: 09/22/2022   History of Present Illness Pt is an 82 y/o female who presents from home with weakness and dizziness on 09/06/22. She was found to have an acute infarct in the postcentral gyrus on the R. PMH significant for IDDM (type 2), HTN, a-fib, CKD III, frequent UTI's with history of ESBL E. Coli UTI, L eye blindnmess, pelvic fx 2018, seizures, systolic CHF, ankle fx surgery 2017, IM nail R  2014, IM nail L 2017, I&D L hip 2017, B shoulder surgery.    PT Comments  Pt progressing towards physical therapy goals. Was able to ambulate in room only due to bowel incontinence however ambulated well with light guard for safety. She was able to maintain prolonged standing at the sink ~4 minutes and participated in hygiene. Pt continues to be appropriate for skilled therapy follow up <3 hours/day. Will continue to follow and progress as able per POC.    Assistance Recommended at Discharge Frequent or constant Supervision/Assistance  If plan is discharge home, recommend the following:  Can travel by private vehicle    A lot of help with bathing/dressing/bathroom;Assistance with cooking/housework;Direct supervision/assist for medications management;Direct supervision/assist for financial management;Help with stairs or ramp for entrance;Assist for transportation;Assistance with feeding;A lot of help with walking and/or transfers   Yes  Equipment Recommendations  None recommended by PT (TBD next venue of care)    Recommendations for Other Services       Precautions / Restrictions Precautions Precautions: Fall Precaution Comments: watch orthostatics; L eye blindness; new bowel incontinence Restrictions Weight Bearing Restrictions: No     Mobility  Bed Mobility               General bed mobility comments: Pt was received sitting up in the recliner.    Transfers Overall  transfer level: Needs assistance Equipment used: Rolling walker (2 wheels) Transfers: Sit to/from Stand Sit to Stand: Min guard, Supervision           General transfer comment: Pt stood from recliner x1 and BSC x2. VC's for hand placement on seated surface for safety as she wants to push up from the RW instead. Increased time but pt able to complete without assist.    Ambulation/Gait Ambulation/Gait assistance: Min guard Gait Distance (Feet): 15 Feet (x2) Assistive device: Rolling walker (2 wheels) Gait Pattern/deviations: Step-through pattern, Decreased stride length, Shuffle, Trunk flexed Gait velocity: Decreased Gait velocity interpretation: <1.31 ft/sec, indicative of household ambulator   General Gait Details: Pt ambulated to the sink where BSC was set up for her. Pt had bowel incontinence on the way and was unaware. She was able to stand at the sink for hygiene but required assist for thorough cleaning. Pt took a seated rest break back onto the [facility] before ambulating back to the recliner.   Stairs             Wheelchair Mobility     Tilt Bed    Modified Rankin (Stroke Patients Only) Modified Rankin (Stroke Patients Only) Pre-Morbid Rankin Score: Moderately severe disability Modified Rankin: Severe disability     Balance Overall balance assessment: Needs assistance Sitting-balance support: Feet supported, No upper extremity supported Sitting balance-Leahy Scale: Fair Sitting balance - Comments: Sits Edge of chair no assist Postural control: Posterior lean Standing balance support: Bilateral upper extremity supported Standing balance-Leahy Scale: Poor Standing balance comment: Unable to come to full stand with maxA  Cognition Arousal/Alertness: Awake/alert Behavior During Therapy: Flat affect Overall Cognitive Status: No family/caregiver present to determine baseline cognitive functioning Area of Impairment:  Safety/judgement, Problem solving, Memory, Awareness                     Memory: Decreased short-term memory Following Commands: Follows one step commands consistently Safety/Judgement: Decreased awareness of deficits Awareness: Emergent Problem Solving: Difficulty sequencing, Requires verbal cues General Comments: Motivated to participate and pleasant.        Exercises      General Comments        Pertinent Vitals/Pain Pain Assessment Pain Assessment: No/denies pain Pain Intervention(s): Monitored during session    Home Living                          Prior Function            PT Goals (current goals can now be found in the care plan section) Acute Rehab PT Goals Patient Stated Goal: return home PT Goal Formulation: With patient Time For Goal Achievement: 10/06/22 Potential to Achieve Goals: Fair Progress towards PT goals: Progressing toward goals    Frequency    Min 3X/week      PT Plan Current plan remains appropriate    Co-evaluation PT/OT/SLP Co-Evaluation/Treatment: Yes            AM-PAC PT "6 Clicks" Mobility   Outcome Measure  Help needed turning from your back to your side while in a flat bed without using bedrails?: A Lot Help needed moving from lying on your back to sitting on the side of a flat bed without using bedrails?: A Lot Help needed moving to and from a bed to a chair (including a wheelchair)?: A Lot Help needed standing up from a chair using your arms (e.g., wheelchair or bedside chair)?: A Little Help needed to walk in hospital room?: A Lot Help needed climbing 3-5 steps with a railing? : Total 6 Click Score: 12    End of Session Equipment Utilized During Treatment: Gait belt Activity Tolerance: Patient tolerated treatment well;Other (comment) (Limited by bowel incontinence) Patient left: with call bell/phone within reach;in chair;with chair alarm set Nurse Communication: Mobility status;Other (comment)  (BP, bowel incontinence (dark tarry stool), purewick discarded due to being soiled.) PT Visit Diagnosis: Unsteadiness on feet (R26.81);Muscle weakness (generalized) (M62.81);Difficulty in walking, not elsewhere classified (R26.2);Pain;Other abnormalities of gait and mobility (R26.89) Pain - part of body:  (buttocks)     Time: 9147-8295 PT Time Calculation (min) (ACUTE ONLY): 29 min  Charges:    $Gait Training: 8-22 mins $Therapeutic Activity: 8-22 mins PT General Charges $$ ACUTE PT VISIT: 1 Visit                     Conni Slipper, PT, DPT Acute Rehabilitation Services Secure Chat Preferred Office: [phone]    Marylynn Pearson 09/22/2022, 1:01 PM

## 2022-09-22 NOTE — Progress Notes (Signed)
[facility]                                                                              [name] [name], is a 82 y.o. female, DOB - [birth year], ZOX:[id number] Admit date - 09/06/2022    Outpatient Primary MD for the patient is [name], MD  LOS - 16  days  Chief Complaint  Patient presents with   Weakness   Dizziness       Brief summary   Patient is a 82 y.o. F with CKD IV baseline ~1.7-2.2, DM, sCHF EF 35-40%, hx HSV encephalitis 2014, pAF on Eliquis, seizures, hx ESBL UTI who presented with dizziness, found to have stroke, acute renal failure and ESBL bacteremia.  Significant events: 7/7: Admitted for stroke, on antibiotics/gentle IVF; Neuro consulted 7/8: Nephrology consulted; BCx growing Klebsiella 7/10: Cr worse, IVF restarted 7/14: Hgb down to 6, transfused 2 units 7/15: Vascath placed, underwent first HD, required 3rd unit PRBCs 7/16: Hgb again <7, required 4th unit PRBC 7/17: HD #2 7/19: Tunneled line placed, HD#3     Significant studies: 7/7: CXR -- perihilar opacities, c/w infection or edema 7/7: MRI brain/MRA H&N -- small R postcentral gyrus infarct, no significant vessel disease  7/8: US renal -- unchanged mild L hydro, 1.5 cm cyst     Significant microbiology data: 7/7 BCx x2: ESBL Klebsiella in 2/2 7/7 UCx: ESBL Klebsiella 7/8 BCx x2: ESBL in 1/3 (although this was drawn in very close proximity to meropenem) 7/13 BCx x2: MRSE in 1/3 (likely contaminant)     Procedures: 7/15: Vascath placed, first HD 7/17: HD#2 7/19: Tunnled line placed, HD#3  Patient seen and examined at bedside. She is sitting in the chair, in pleasant mood and answering all questions appropriately.     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  [place] without clinically significant disease.   - likely ischemic ATN in setting of  ESBL bacteremia and CHF and chronic kidney disease. - s/p temporary right internal jugular HD cath placed on 7/15, and it was changed to tunneled on 09/18/22.   -Improved now after 3 sessions of HD, uremia is better. - Hemodialysis per nephrology -Continue MWF schedule for now, started CLIP process for outpatient HD center - she is alert and answering all questions appropriately.      Active problems ESBL Bacteremia due to UTI -Found on admission to have bacteremia, cultures positive for ESBL. -Completed 7 days Meropenem .       Acute CVA (cerebrovascular accident) (HCC) -MRI brain on admission showed right post-central gyrus infarct - Non-invasive angiography showed no significant disease - Echocardiogram completed 1 month ago showed no cardiogenic source of embolism - Lipids ordered: LDL undetectable - Continue Lipitor, Zetia - Aspirin deferred in place of Eliquis - Afib known, on amiodarone, Eliquis resumed. - tPA not given because outside window - Dysphagia screen completed - PT eval ordered: recommend SNF     Anemia in chronic kidney disease (CKD) - Iron studies normal.  Hemoglobin  trended down and required PRBC transfusion x4 last week.   - Eliquis was placed on hold.  Patient was seen by GI on 7/19, recommended supportive care, no therapeutic interventions unless signs of GI hemorrhage -Eliquis has been resumed, follow H&H.  Hemoglobin stabilized to 8.  -If continues to trend down or signs of hematochezia, melena or hematemesis, discontinue Eliquis and reconsult GI.      Chronic systolic CHF (congestive heart failure) (HCC) - Volume management with HD    Paroxysmal atrial fibrillation (HCC) - Rate controlled, continue amiodarone - Metoprolol has been held due to orthostasis - Resumed Eliquis   CAD S/P percutaneous coronary angioplasty - Continue atorvastatin, Zetia - Resumed Eliquis.  - Metoprolol currently held due to orthostasis/ hypotension.   Diabetes  (HCC) - Hold NPH  CBG (last 3)  Recent Labs    09/21/22 2113 09/22/22 0622 09/22/22 1145  GLUCAP 260* 187* 218*   CBGs elevated, added Semglee 3 units at bedtime, change to moderate SSI.     Seizure disorder (HCC) No longer on AED   Essential hypertension BP parameters are much better today.    Hyperlipidemia - Continue Lipitor and Zetia     Estimated body mass index is 26.4 kg/m as calculated from the following:   Height as of this encounter: 5\' 9"  (1.753 m).   Weight as of this encounter: 81.1 kg.  Code Status: DNR DVT Prophylaxis:  apixaban (ELIQUIS) tablet 2.5 mg Start: 09/20/22 2200Resume Eliquis apixaban (ELIQUIS) tablet 2.5 mg   Level of Care: Level of care: Med-Surg Family Communication:  Disposition Plan:      Remains inpatient appropriate: Workup in progress, needs outpatient HD center arranged   Consultants:   Neurology Nephrology Gastroenterology  Antimicrobials:   Anti-infectives (From admission, onward)    Start     Dose/Rate Route Frequency Ordered Stop   09/18/22 1215  ceFAZolin (ANCEF) IVPB 2g/100 mL premix        2 g 200 mL/hr over 30 Minutes Intravenous On call 09/18/22 1124 09/18/22 1300   09/06/22 1400  meropenem (MERREM) 1 g in sodium chloride 0.9 % 100 mL IVPB        1 g 200 mL/hr over 30 Minutes Intravenous Every 24 hours 09/06/22 1254 09/14/22 0559          Medications  amiodarone  200 mg Oral Daily   apixaban  2.5 mg Oral BID   atorvastatin  40 mg Oral Daily   Chlorhexidine Gluconate Cloth  6 each Topical Q0600   darbepoetin (ARANESP) injection - NON-DIALYSIS  60 mcg Subcutaneous Q Sat-1800   ezetimibe  10 mg Oral Daily   feeding supplement (NEPRO CARB STEADY)  237 mL Oral Q24H   heparin sodium (porcine)  10 mL Intracatheter Once   hydrocortisone cream   Topical TID   insulin aspart  0-6 Units Subcutaneous TID WC   insulin glargine-yfgn  3 Units Subcutaneous QHS   pantoprazole  40 mg Oral Daily   polyethylene glycol   17 g Oral Daily   witch hazel-glycerin   Topical TID      Subjective:   [name] was seen and examined today.  Alert and sitting in the chair, no complaints. Wants to know when she can be discharged.  Objective:   Vitals:   09/22/22 0500 09/22/22 0623 09/22/22 0750 09/22/22 1124  BP:  123/67 128/68 123/72  Pulse:  78 86 71  Resp:  19 18 18   Temp:  (!) 97 F (36.1 C) (!)  97.4 F (36.3 C) 97.6 F (36.4 C)  TempSrc:  Axillary Oral Oral  SpO2:  99% 94% 99%  Weight: 81.1 kg     Height:        Intake/Output Summary (Last 24 hours) at 09/22/2022 1207 Last data filed at 09/22/2022 6578 Gross per 24 hour  Intake 360 ml  Output 500 ml  Net -140 ml     Wt Readings from Last 3 Encounters:  09/22/22 81.1 kg  08/25/22 75.7 kg  08/12/22 79.8 kg   Physical Exam General exam: Appears calm and comfortable  Respiratory system: Clear to auscultation. Respiratory effort normal. Cardiovascular system: S1 & S2 heard, RRR.  Gastrointestinal system: Abdomen is nondistended, soft and nontender.  Central nervous system: Alert and oriented. Extremities: trace edema.  Psychiatry: mood is appropriate.     Data Reviewed:  I have personally reviewed following labs    CBC Lab Results  Component Value Date   WBC 12.6 (H) 09/21/2022   RBC 2.69 (L) 09/21/2022   HGB 8.2 (L) 09/21/2022   HCT 26.2 (L) 09/21/2022   MCV 97.4 09/21/2022   MCH 30.5 09/21/2022   PLT 296 09/21/2022   MCHC 31.3 09/21/2022   RDW 21.9 (H) 09/21/2022   LYMPHSABS 0.5 (L) 09/06/2022   MONOABS 0.5 09/06/2022   EOSABS 0.3 09/06/2022   BASOSABS 0.0 09/06/2022     Last metabolic panel Lab Results  Component Value Date   NA 133 (L) 09/21/2022   K 4.0 09/21/2022   CL 95 (L) 09/21/2022   CO2 30 09/21/2022   BUN 19 09/21/2022   CREATININE 2.13 (H) 09/21/2022   GLUCOSE 250 (H) 09/21/2022   GFRNONAA 23 (L) 09/21/2022   GFRAA 26 (L) 07/20/2018   CALCIUM 8.0 (L) 09/21/2022   PHOS 4.1 09/16/2022   PROT  5.1 (L) 09/20/2022   ALBUMIN <1.5 (L) 09/20/2022   LABGLOB 3.7 09/07/2022   AGRATIO 0.4 (L) 09/07/2022   BILITOT 0.5 09/20/2022   ALKPHOS 74 09/20/2022   AST 26 09/20/2022   ALT 9 09/20/2022   ANIONGAP 8 09/21/2022    CBG (last 3)  Recent Labs    09/21/22 2113 09/22/22 0622 09/22/22 1145  GLUCAP 260* 187* 218*      Coagulation Profile: No results for input(s): "INR", "PROTIME" in the last 168 hours.   Radiology Studies: I have personally reviewed the imaging studies  No results found.     Kathlen Mody M.D. [facility] 09/22/2022, 12:07 PM  Available via Epic secure chat 7am-7pm After 7 pm, please refer to night coverage provider listed on amion.

## 2022-09-22 NOTE — Progress Notes (Signed)
Occupational Therapy Treatment Patient Details Name: [name] [name] MRN: [id number] DOB: 1940/09/27 Today's Date: 09/22/2022   History of present illness Pt is an 82 y/o female who presents 09/06/22 s/p syncope and fall while walking to the car as family was attempting to bring her to the ED for vomiting, weakness. Of note, pt with recent admission with d/c 07/10/22 from [facility] for UTI and hematura with elevated INR (discharged with foley). PMH significant for A fib, L eye blindness, DM II, HTN, osteopenia, pelvic fracture 2018, seizures, systolic CHF, ankle fx surgery 2017, IM nail R in 2014 and L 2017, I&D hip 2017, B shoulder surgery.   OT comments  Pt making steady progress towards OT goals, meeting 3/4 and upgraded accordingly. Pt able to manage transfers with RW at min guard, as well as improving standing tolerance > 3 min while assisted with peri care in setting of bowel incontinence. Pt managing LB ADLs with Mod-Max A today. Continue to recommend < 3 hours of therapy per day at DC unless family able to provide the currently needed physical assistance.   Recommendations for follow up therapy are one component of a multi-disciplinary discharge planning process, led by the attending physician.  Recommendations may be updated based on patient status, additional functional criteria and insurance authorization.    Assistance Recommended at Discharge Frequent or constant Supervision/Assistance  Patient can return home with the following  A lot of help with bathing/dressing/bathroom;A lot of help with walking and/or transfers   Equipment Recommendations  Hospital bed;Wheelchair cushion (measurements OT);Wheelchair (measurements OT);BSC/3in1    Recommendations for Other Services      Precautions / Restrictions Precautions Precautions: Fall Precaution Comments: watch orthostatics; L eye blindness; new bowel incontinence Restrictions Weight Bearing Restrictions: No       Mobility Bed  Mobility Overal bed mobility: Modified Independent Bed Mobility: Supine to Sit     Supine to sit: Modified independent (Device/Increase time)          Transfers Overall transfer level: Needs assistance Equipment used: Rolling walker (2 wheels) Transfers: Sit to/from Stand, Bed to chair/wheelchair/BSC Sit to Stand: Min guard     Step pivot transfers: Min guard     General transfer comment: able to stand at bedside > 3 min while assisted with peri care prior to transfer to chair. minor cues for technique/pacing with RW use     Balance Overall balance assessment: Needs assistance Sitting-balance support: Feet supported, No upper extremity supported Sitting balance-Leahy Scale: Fair     Standing balance support: Bilateral upper extremity supported Standing balance-Leahy Scale: Poor                             ADL either performed or assessed with clinical judgement   ADL Overall ADL's : Needs assistance/impaired Eating/Feeding: Sitting;Modified independent Eating/Feeding Details (indicate cue type and reason): compensatory strategies due to vision impairments though pt able to locate items on tray without assist. Grooming: Set up;Sitting;Wash/dry face       Lower Body Bathing: Maximal assistance;Sit to/from stand Lower Body Bathing Details (indicate cue type and reason): Assist for posterior peri care w/ near constant bowel incontinence Upper Body Dressing : Set up;Sitting   Lower Body Dressing: Moderate assistance;Sitting/lateral leans;Sit to/from stand Lower Body Dressing Details (indicate cue type and reason): assist to doff soiled socks with pt able to don clean socks with increased time/effort sitting in recliner  Extremity/Trunk Assessment Upper Extremity Assessment Upper Extremity Assessment: Generalized weakness   Lower Extremity Assessment Lower Extremity Assessment: Defer to PT evaluation        Vision   Vision  Assessment?: Vision impaired- to be further tested in functional context Additional Comments: baseline L eye blindness. pt reports "my vision sucks"   Perception     Praxis      Cognition Arousal/Alertness: Awake/alert Behavior During Therapy: Flat affect Overall Cognitive Status: No family/caregiver present to determine baseline cognitive functioning Area of Impairment: Safety/judgement, Problem solving, Memory, Awareness                     Memory: Decreased short-term memory   Safety/Judgement: Decreased awareness of deficits Awareness: Emergent Problem Solving: Difficulty sequencing, Requires verbal cues General Comments: able to express likely need of cleanup assist with recent hx of bowel incontinence, able to ask appropriate questions for transfer and OOB attempts; decreased insight into trajectory of rehab progression        Exercises      Shoulder Instructions       General Comments      Pertinent Vitals/ Pain       Pain Assessment Pain Assessment: No/denies pain  Home Living                                          Prior Functioning/Environment              Frequency  Min 2X/week        Progress Toward Goals  OT Goals(current goals can now be found in the care plan section)  Progress towards OT goals: Progressing toward goals  Acute Rehab OT Goals Patient Stated Goal: regain leg strength to be able to walk OT Goal Formulation: With patient Time For Goal Achievement: 10/05/22 Potential to Achieve Goals: Good  Plan Discharge plan remains appropriate    Co-evaluation                 AM-PAC OT "6 Clicks" Daily Activity     Outcome Measure   Help from another person eating meals?: None Help from another person taking care of personal grooming?: A Little Help from another person toileting, which includes using toliet, bedpan, or urinal?: Total Help from another person bathing (including washing, rinsing,  drying)?: A Lot Help from another person to put on and taking off regular upper body clothing?: A Lot Help from another person to put on and taking off regular lower body clothing?: A Lot 6 Click Score: 14    End of Session Equipment Utilized During Treatment: Rolling walker (2 wheels)  OT Visit Diagnosis: Unsteadiness on feet (R26.81);Other abnormalities of gait and mobility (R26.89);Muscle weakness (generalized) (M62.81)   Activity Tolerance Patient tolerated treatment well   Patient Left in chair;with call bell/phone within reach;with chair alarm set   Nurse Communication Other (comment);Mobility status (bowel incontinence)        Time: 1610-9604 OT Time Calculation (min): 27 min  Charges: OT General Charges $OT Visit: 1 Visit OT Treatments $Self Care/Home Management : 23-37 mins  Bradd Canary, OTR/L Acute Rehab Services Office: [phone]   Lorre Munroe 09/22/2022, 8:08 AM

## 2022-09-23 DIAGNOSIS — N184 Chronic kidney disease, stage 4 (severe): Secondary | ICD-10-CM | POA: Diagnosis not present

## 2022-09-23 DIAGNOSIS — N179 Acute kidney failure, unspecified: Secondary | ICD-10-CM | POA: Diagnosis not present

## 2022-09-23 LAB — CBC
HCT: 28.3 % — ABNORMAL LOW (ref 36.0–46.0)
Hemoglobin: 8.8 g/dL — ABNORMAL LOW (ref 12.0–15.0)
MCH: 29.5 pg (ref 26.0–34.0)
MCHC: 31.1 g/dL (ref 30.0–36.0)
MCV: 95 fL (ref 80.0–100.0)
Platelets: 381 10*3/uL (ref 150–400)
RBC: 2.98 MIL/uL — ABNORMAL LOW (ref 3.87–5.11)
RDW: 21.7 % — ABNORMAL HIGH (ref 11.5–15.5)
WBC: 11.4 10*3/uL — ABNORMAL HIGH (ref 4.0–10.5)
nRBC: 0 % (ref 0.0–0.2)

## 2022-09-23 LAB — RENAL FUNCTION PANEL
Albumin: 1.6 g/dL — ABNORMAL LOW (ref 3.5–5.0)
Anion gap: 9 (ref 5–15)
BUN: 11 mg/dL (ref 8–23)
CO2: 29 mmol/L (ref 22–32)
Calcium: 8 mg/dL — ABNORMAL LOW (ref 8.9–10.3)
Chloride: 94 mmol/L — ABNORMAL LOW (ref 98–111)
Creatinine, Ser: 1.78 mg/dL — ABNORMAL HIGH (ref 0.44–1.00)
GFR, Estimated: 29 mL/min — ABNORMAL LOW (ref >60)
Glucose, Bld: 121 mg/dL — ABNORMAL HIGH (ref 70–99)
Phosphorus: 2.3 mg/dL — ABNORMAL LOW (ref 2.5–4.6)
Potassium: 3.5 mmol/L (ref 3.5–5.1)
Sodium: 132 mmol/L — ABNORMAL LOW (ref 135–145)

## 2022-09-23 LAB — GLUCOSE, CAPILLARY
Glucose-Capillary: 98 mg/dL (ref 70–99)
Glucose-Capillary: 96 mg/dL (ref 70–99)
Glucose-Capillary: 83 mg/dL (ref 70–99)
Glucose-Capillary: 175 mg/dL — ABNORMAL HIGH (ref 70–99)
Glucose-Capillary: 103 mg/dL — ABNORMAL HIGH (ref 70–99)

## 2022-09-23 MED ORDER — ATORVASTATIN CALCIUM 40 MG PO TABS: 40.0000 mg | ORAL_TABLET | Freq: Every day | ORAL | Status: DC

## 2022-09-23 MED ORDER — ANTICOAGULANT SODIUM CITRATE 4% (200MG/5ML) IV SOLN
5.0000 mL | Status: DC | PRN
  Filled 2022-09-23: qty 5

## 2022-09-23 MED ORDER — PENTAFLUOROPROP-TETRAFLUOROETH EX AERO: 1.0000 | INHALATION_SPRAY | CUTANEOUS | Status: DC | PRN

## 2022-09-23 MED ORDER — LIDOCAINE-PRILOCAINE 2.5-2.5 % EX CREA
1.0000 | TOPICAL_CREAM | CUTANEOUS | Status: DC | PRN
  Filled 2022-09-23: qty 5

## 2022-09-23 MED ORDER — LIDOCAINE HCL (PF) 1 % IJ SOLN: 5.0000 mL | INTRAMUSCULAR | Status: DC | PRN

## 2022-09-23 MED ORDER — PANTOPRAZOLE SODIUM 40 MG PO TBEC: 40.0000 mg | DELAYED_RELEASE_TABLET | Freq: Every day | ORAL | Status: DC

## 2022-09-23 MED ORDER — POLYETHYLENE GLYCOL 3350 17 G PO PACK: 17.0000 g | PACK | Freq: Every day | ORAL | Status: DC

## 2022-09-23 MED ORDER — ALTEPLASE 2 MG IJ SOLR
2.0000 mg | Freq: Once | INTRAMUSCULAR | Status: DC | PRN
  Filled 2022-09-23: qty 2

## 2022-09-23 MED ORDER — HEPARIN SODIUM (PORCINE) 1000 UNIT/ML DIALYSIS
1000.0000 [IU] | INTRAMUSCULAR | Status: DC | PRN
  Filled 2022-09-23: qty 1

## 2022-09-23 MED ORDER — DARBEPOETIN ALFA 150 MCG/0.3ML IJ SOSY: 150.0000 ug | PREFILLED_SYRINGE | INTRAMUSCULAR | Status: DC

## 2022-09-23 NOTE — Progress Notes (Signed)
   09/23/22 1142  Vitals  Temp 98.2 F (36.8 C)  Pulse Rate 93  Resp 16  BP 135/61  SpO2 98 %  O2 Device Room Air  Post Treatment  Dialyzer Clearance Clear  Duration of HD Treatment -hour(s) 3.5 hour(s)  Hemodialysis Intake (mL) 0 mL  Liters Processed 84  Fluid Removed (mL) 2500 mL  Tolerated HD Treatment Yes   Received patient in bed to unit.  Alert and oriented.  Informed consent signed and in chart.   TX duration:3.5hr  Patient tolerated well.  Transported back to the room  Alert, without acute distress.  Hand-off given to patient's nurse.   Access used: [facility] Access issues: NONE  Total UF removed: 2.5L Medication(s) given: NONE    [facility]

## 2022-09-23 NOTE — Significant Event (Signed)
Patient taken to HD via bed by transport. VS stable.

## 2022-09-23 NOTE — Progress Notes (Signed)
[facility] Progress Note   Subjective:    No complaints HD session on Mon w/ no issues  Objective Vitals:   09/23/22 1000 09/23/22 1030 09/23/22 1100 09/23/22 1130  BP: 134/80 114/63 122/62 (!) 110/58  Pulse: 79 89 95 82  Resp: 15 12 18 15   Temp:      TempSrc:      SpO2: 98% 98% 97% 96%  Weight:      Height:       Physical Exam UVO:ZDGUYQI in chair, nad ENT: no nasal discharge, mmm EYES: no scleral icterus, eomi CV: normal rate PULM: no iwob, bilateral chest rise ABD: NABS, non-distended SKIN: no rashes or jaundice EXT: 1+ edema in ble, warm and well perfused R internal jugular TDC bandaged  Additional Objective Labs: Basic Metabolic Panel: Recent Labs  Lab 09/20/22 0425 09/21/22 0500 09/21/22 2341  NA 132* 131* 133*  K 3.7 3.7 4.0  CL 100 99 95*  CO2 26 27 30   GLUCOSE 192* 209* 250*  BUN 32* 35* 19  CREATININE 2.35* 2.90* 2.13*  CALCIUM 7.5* 7.8* 8.0*   Liver Function Tests: Recent Labs  Lab 09/17/22 0112 09/20/22 0425  AST 36 26  ALT 14 9  ALKPHOS 64 74  BILITOT 0.8 0.5  PROT 4.6* 5.1*  ALBUMIN <1.5* <1.5*   No results for input(s): "LIPASE", "AMYLASE" in the last 168 hours. CBC: Recent Labs  Lab 09/17/22 0112 09/18/22 1040 09/19/22 1101 09/20/22 0425 09/21/22 0500 09/21/22 2341  WBC 13.3*  --  12.2* 11.1* 11.5* 12.6*  HGB 7.5*   < > 8.2* 8.1* 7.8* 8.2*  HCT 24.0*   < > 26.6* 26.2* 25.2* 26.2*  MCV 93.8  --  95.3 96.3 98.4 97.4  PLT 219  --  272 268 289 296   < > = values in this interval not displayed.   Blood Culture    Component Value Date/Time   SDES BLOOD RIGHT HAND 09/12/2022 1434   SDES BLOOD LEFT HAND 09/12/2022 1434   SPECREQUEST  09/12/2022 1434    BOTTLES DRAWN AEROBIC AND ANAEROBIC Blood Culture adequate volume   SPECREQUEST  09/12/2022 1434    BOTTLES DRAWN AEROBIC AND ANAEROBIC Blood Culture results may not be optimal due to an excessive volume of blood received in culture bottles   CULT  09/12/2022  1434    NO GROWTH 5 DAYS Performed at [facility], [address], [place], [place] [address]    CULT (A) 09/12/2022 1434    STAPHYLOCOCCUS EPIDERMIDIS THE SIGNIFICANCE OF ISOLATING THIS ORGANISM FROM A SINGLE SET OF BLOOD CULTURES WHEN MULTIPLE SETS ARE DRAWN IS UNCERTAIN. PLEASE NOTIFY THE MICROBIOLOGY DEPARTMENT WITHIN ONE WEEK IF SPECIATION AND SENSITIVITIES ARE REQUIRED. Performed at [facility], [address], [place], [place] [address]    REPTSTATUS 09/17/2022 FINAL 09/12/2022 1434   REPTSTATUS 09/15/2022 FINAL 09/12/2022 1434    Cardiac Enzymes: No results for input(s): "CKTOTAL", "CKMB", "CKMBINDEX", "TROPONINI" in the last 168 hours. CBG: Recent Labs  Lab 09/22/22 1145 09/22/22 1611 09/22/22 2114 09/23/22 0617 09/23/22 0734  GLUCAP 218* 212* 170* 98 96   Iron Studies:  No results for input(s): "IRON", "TIBC", "TRANSFERRIN", "FERRITIN" in the last 72 hours.  @lablastinr3 @ Studies/Results: No results found. Medications:    amiodarone  200 mg Oral Daily   apixaban  2.5 mg Oral BID   atorvastatin  40 mg Oral Daily   Chlorhexidine Gluconate Cloth  6 each Topical Q0600   darbepoetin (ARANESP) injection -  NON-DIALYSIS  60 mcg Subcutaneous Q Sat-1800   ezetimibe  10 mg Oral Daily   feeding supplement (NEPRO CARB STEADY)  237 mL Oral Q24H   heparin sodium (porcine)  10 mL Intracatheter Once   hydrocortisone cream   Topical TID   insulin aspart  0-6 Units Subcutaneous TID WC   insulin glargine-yfgn  3 Units Subcutaneous QHS   pantoprazole  40 mg Oral Daily   polyethylene glycol  17 g Oral Daily   witch hazel-glycerin   Topical TID    Assessment/PlanVirginia E [name] is an 82 y.o. female atrial fibrillation, DM type 2, HFrEF, HTN, HL,  currently admitted for ESBL E coli UTI and CVA for whom nephrology is consulted for evaluation and management of AKI on CKD.    **AKI on CKD:  baseline Cr was in mid 2s > 08/2022 admission presenting with Cr 4.55 and  developed uremic.  No nephrology care outpt.  UA consistent with UTI.  Imaging with no new findings - do not suspect mild hydro on L significantly contributing.  Modest hypotension noted - suspect ATN related to acute infection + hypovolemia.  S/p IV fluid challenge and kidney function continued to worsen and develooped uremic symptoms.   Temp R internal jugular HD cath placed 7/15; tunneled 7/19 HD #1 7/15 Cont on MWF schedule for now, no heparin CLIP'd to [facility] for HD MWF at 12:15 she is okay to DC from nephrology perspective   **acute CVA: post central gyrus, neurology following.  PT/OT have been consulted.    **Hyponatremia:  mild, stable, HD addressing  **Hyperkalemia: improved / resolved with HD   **Anemia, normocytic:  Baseline in the past few months 8-9s.  Dropped this AM from 7-8s to 5.8 with report of dark stools. Iron sat 11% - s/p  IV. FE.  Started ESA 7/13.  Transfusion per primary.    **HFrEF:  EF 35% 08/2022.  UF with HD   **UTI: ESBL Klebsiella, sens only to imipenem. Finished ABX   **R renal hypoechoic structure:  rads rec 40mo f/u US ensure stable   **DM: insulin per primary.    **A fib:  on  BB, amiodarone.   Will follow, call with concerns.   Darnell Level, MD  09/23/2022, 11:39 AM  [facility]

## 2022-09-23 NOTE — TOC Progression Note (Signed)
Transition of Care [facility]) - Initial/Assessment Note    Patient Details  Name: [name] [name] MRN: [id number] Date of Birth: 01-04-1941  Transition of Care [facility]) CM/SW Contact:    Ralene Bathe, LCSW Phone Number: 09/23/2022, 1:29 PM  Clinical Narrative:                 LCSW informed that the patient is medically ready for discharge and contacted [facility] (SNF).  The facility will have a bed available tomorrow.  TOC following.   Expected Discharge Plan: Skilled Nursing Facility Barriers to Discharge: Continued Medical Work up, English as a second language teacher   Patient Goals and CMS Choice Patient states their goals for this hospitalization and ongoing recovery are:: patient unable to participate in goal setting, not fully oriented CMS Medicare.gov Compare Post Acute Care list provided to:: Patient Represenative (must comment) Choice offered to / list presented to : Spouse [place] ownership interest in [facility].provided to:: Spouse    Expected Discharge Plan and Services     Post Acute Care Choice: NA Living arrangements for the past 2 months: Single Family Home                                      Prior Living Arrangements/Services Living arrangements for the past 2 months: Single Family Home Lives with:: Spouse Patient language and need for interpreter reviewed:: No Do you feel safe going back to the place where you live?: Yes      Need for Family Participation in Patient Care: Yes (Comment) Care giver support system in place?: No (comment)   Criminal Activity/Legal Involvement Pertinent to Current Situation/Hospitalization: No - Comment as needed  Activities of Daily Living Home Assistive Devices/Equipment: Bedside commode/3-in-1 ADL Screening (condition at time of admission) Patient's cognitive ability adequate to safely complete daily activities?: Yes Is the patient deaf or have difficulty hearing?: No Does the patient have  difficulty seeing, even when wearing glasses/contacts?: No Does the patient have difficulty concentrating, remembering, or making decisions?: No Patient able to express need for assistance with ADLs?: Yes Does the patient have difficulty dressing or bathing?: Yes Independently performs ADLs?: No Communication: Independent Dressing (OT): Needs assistance Is this a change from baseline?: Pre-admission baseline Grooming: Needs assistance Is this a change from baseline?: Pre-admission baseline Feeding: Independent Bathing: Needs assistance Is this a change from baseline?: Pre-admission baseline Toileting: Needs assistance Is this a change from baseline?: Pre-admission baseline In/Out Bed: Needs assistance Is this a change from baseline?: Pre-admission baseline Walks in Home: Dependent Is this a change from baseline?: Pre-admission baseline Does the patient have difficulty walking or climbing stairs?: Yes Weakness of Legs: Both Weakness of Arms/Hands: Both  Permission Sought/Granted Permission sought to share information with : Family Supports Permission granted to share information with : Yes, Verbal Permission Granted  Share Information with NAME: Jonny Ruiz     Permission granted to share info w Relationship: Spouse     Emotional Assessment   Attitude/Demeanor/Rapport: Unable to Assess Affect (typically observed): Unable to Assess Orientation: : Oriented to Self, Oriented to Place, Oriented to  Time Alcohol / Substance Use: Not Applicable Psych Involvement: No (comment)  Admission diagnosis:  Poorly controlled diabetes mellitus (HCC) [E11.65] Acute cystitis with hematuria [N30.01] Acute CVA (cerebrovascular accident) [facility]) [I63.9] Cerebrovascular accident (CVA), unspecified mechanism (HCC) [I63.9] Patient Active Problem List   Diagnosis Date Noted   Hyperkalemia 09/11/2022  Anemia in chronic kidney disease (CKD) 09/09/2022   Chronic systolic CHF (congestive heart failure)  (HCC) 09/09/2022   ESBL Bacteremia due to UTI 09/07/2022   Acute CVA (cerebrovascular accident) (HCC) 09/06/2022   A-fib (HCC) 08/04/2022   Atrial fibrillation with rapid ventricular response (HCC) 08/03/2022   Acute renal failure superimposed on stage 4 chronic kidney disease (HCC) 07/20/2022   Supratherapeutic INR 07/08/2022   Hemorrhagic cystitis 07/08/2022   Gross hematuria 07/07/2022   CKD (chronic kidney disease) stage 4, GFR 15-29 ml/min (HCC) 04/10/2020   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   Osteoporosis 07/22/2018   Closed compression fracture of L1 vertebra (HCC) 07/19/2018   Cardiomyopathy (HCC) 04/07/2018   Foot ulcer (HCC) 07/24/2016   Gram-negative infection    CKD (chronic kidney disease) stage 3, GFR 30-59 ml/min (HCC)    Postoperative anemia due to acute blood loss 01/04/2016   Left hip postoperative wound infection 01/02/2016   Paroxysmal atrial fibrillation (HCC) 05/20/2015   CAD S/P percutaneous coronary angioplasty 05/20/2015   Acute systolic CHF (congestive heart failure) (HCC) 05/20/2015   Acute kidney injury superimposed on chronic kidney disease (HCC) 05/11/2015   PCP NOTES >>>>>>>>>>>>>>>> 12/28/2014   Memory loss 05/17/2014   Diabetes (HCC) 01/23/2014   Anxiety and depression, PCP notes  11/24/2013   Severe obesity (BMI >= 40) (HCC) 07/14/2013   Encounter for therapeutic drug monitoring 06/02/2013   History of encephalitis 11/18/2012   Intertrochanteric fracture of right hip (HCC) 07/13/2012   GERD (gastroesophageal reflux disease) 10/05/2011   Seizure disorder (HCC) 05/24/2011   Hyponatremia 05/17/2011   Annual physical exam >>>>>>>>>>>>>>>>>>>>>>>>>>>>>>>.. 02/10/2011   LUNG NODULE 09/01/2006   Hyperlipidemia 03/27/2006   Hereditary and idiopathic peripheral neuropathy 03/27/2006   RETINOPATHY, BACKGROUND NOS 03/27/2006   Essential hypertension 03/27/2006   PCP:  [name], MD Pharmacy:   [facility] [address] [place], [place]  - [address] [place] [address] Phone: [phone] Fax: [phone]  [facility] [address] [place], [place] - [address] [place] [address] Phone: [phone] Fax: [phone]     Social Determinants of Health (SDOH) Social History: SDOH Screenings   Food Insecurity: No Food Insecurity (09/06/2022)  Housing: Low Risk  (09/06/2022)  Transportation Needs: No Transportation Needs (09/06/2022)  Utilities: Not At Risk (09/06/2022)  Alcohol Screen: Low Risk  (08/22/2020)  Depression (PHQ2-9): Low Risk  (08/21/2022)  Financial Resource Strain: Low Risk  (08/22/2020)  Physical Activity: Inactive (08/22/2020)  Social Connections: Moderately Integrated (08/22/2020)  Stress: No Stress Concern Present (08/22/2020)  Tobacco Use: Medium Risk (09/07/2022)   SDOH Interventions: Food Insecurity Interventions: Intervention Not Indicated Housing Interventions: Intervention Not Indicated Transportation Interventions: Intervention Not Indicated Utilities Interventions: Intervention Not Indicated   Readmission Risk Interventions    08/06/2022    3:31 PM 07/23/2022   12:43 PM  Readmission Risk Prevention Plan  Transportation Screening Complete Complete  PCP or Specialist Appt within 5-7 Days  Complete  PCP or Specialist Appt within 3-5 Days Complete   Home Care Screening  Complete  Medication Review (RN CM)  Referral to Pharmacy  HRI or Home Care Consult Complete   Social Work Consult for Recovery Care Planning/Counseling --   Palliative Care Screening Not Applicable   Medication Review Oceanographer) Complete

## 2022-09-23 NOTE — Discharge Summary (Addendum)
Physician Discharge Summary  [name] [name] ZOX:[id number] DOB: 1940-10-20 DOA: 09/06/2022  PCP: [name], MD  Admit date: 09/06/2022 Discharge date: 09/24/2022  Admitted From: Home Disposition:  SNF  Discharge Condition:Stable CODE STATUS:FULL Diet recommendation: Renal  Brief/Interim Summary: Patient is a 82 y.o. F with CKD IV baseline ~1.7-2.2, DM, sCHF EF 35-40%, hx HSV encephalitis 2014, pAF on Eliquis, seizures, hx ESBL UTI who presented with dizziness .  MRI was done on admission which showed right postcentral gyrus infarct, neurology was following.  She completed 7 days course of meropenem for ESBL bacteremia secondary to UTI.  Hospital course also remarkable for progressive deterioration in the renal function on the background of CKD stage IV.  Nephrology was following.  She was ultimately started on dialysis.  PT/OT recommending SNF on discharge.  Her outpatient dialysis set up has been completed.  Medically stable for discharge to SNF.  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  [place] without clinically significant disease.   - likely ischemic ATN in setting of ESBL bacteremia and CHF and chronic kidney disease. - s/p temporary right internal jugular HD cath placed on 7/15, and it was changed to tunneled on 09/18/22.  -Continue MWF schedule for now, completed CLIP process for outpatient HD center   ESBL Bacteremia due to UTI -Found on admission to have bacteremia, cultures positive for ESBL. -Completed 7 days Meropenem .    Acute CVA (cerebrovascular accident)  -MRI brain on admission showed right post-central gyrus infarct - Non-invasive angiography showed no significant disease - Echocardiogram completed 1 month ago showed no cardiogenic source of embolism - Lipids ordered: LDL undetectable - Continue Lipitor, Zetia - Aspirin deferred in place of  Eliquis - Afib known, on amiodarone, Eliquis resumed - Dysphagia screen completed - PT eval ordered: recommend SNF   Anemia in chronic kidney disease  - Iron studies normal.  Hemoglobin trended down and required PRBC transfusion x4 last week.   - Eliquis was placed on hold.  Patient was seen by GI on 7/19, recommended supportive care, no therapeutic interventions unless signs of GI hemorrhage Hemoglobin stabilized to 8.    Chronic systolic CHF  - Volume management with HD    Paroxysmal atrial fibrillation  - Rate controlled, continue amiodarone - Metoprolol has been held due to orthostasis - Resumed Eliquis   CAD S/P percutaneous coronary angioplasty - Continue atorvastatin, Zetia - Resumed Eliquis.  - Metoprolol currently held due to orthostasis/ hypotension.   Diabetes (HCC) - Continue home regimen, monitor blood sugars    Seizure disorder (HCC) No longer on AED   Essential hypertension BP stable   Hyperlipidemia - Continue Lipitor and Zetia   Discharge Diagnoses:  Principal Problem:   Acute renal failure superimposed on stage 4 chronic kidney disease (HCC) Active Problems:   Acute CVA (cerebrovascular accident) (HCC)   ESBL Bacteremia due to UTI   Hyponatremia   Hyperlipidemia   Essential hypertension   Paroxysmal atrial fibrillation (HCC)   Seizure disorder (HCC)   Diabetes (HCC)   CAD S/P percutaneous coronary angioplasty   CKD (chronic kidney disease) stage 4, GFR 15-29 ml/min (HCC)   Anemia in chronic kidney disease (CKD)   Chronic systolic CHF (congestive heart failure) (HCC)   Hyperkalemia    Discharge Instructions  Discharge Instructions     Diet - low sodium heart healthy   Complete by: As directed  Discharge instructions   Complete by: As directed    1)Please take your medications as instructed   Increase activity slowly   Complete by: As directed    No wound care   Complete by: As directed       Allergies as of 09/24/2022        Reactions   Procaine Hcl Anaphylaxis   Amoxicillin Itching        Medication List     STOP taking these medications    furosemide 40 MG tablet Commonly known as: LASIX   metoprolol succinate 50 MG 24 hr tablet Commonly known as: TOPROL-XL   Nitroglycerin 0.4 % Oint       TAKE these medications    alendronate 35 MG tablet Commonly known as: FOSAMAX Take 35 mg by mouth every 7 (seven) days. Take with a full glass of water on an empty stomach.   amiodarone 200 MG tablet Commonly known as: PACERONE Take 1 tablet (200 mg total) by mouth daily.   apixaban 2.5 MG Tabs tablet Commonly known as: ELIQUIS Take 1 tablet (2.5 mg total) by mouth 2 (two) times daily. To start likely on Friday 6/14 if recommended by Coumadin clinic after labs done   atorvastatin 40 MG tablet Commonly known as: LIPITOR Take 1 tablet (40 mg total) by mouth daily. What changed:  medication strength how much to take   ezetimibe 10 MG tablet Commonly known as: ZETIA Take 1 tablet (10 mg total) by mouth daily.   hydrocortisone 2.5 % cream Apply topically 2 (two) times daily.   MULTIVITAMIN ADULT PO Take 1 tablet by mouth daily with breakfast.   NovoLIN 70/30 ReliOn (70-30) 100 UNIT/ML injection Generic drug: insulin NPH-regular Human 10 units in the morning and 5 units at night. What changed:  how much to take how to take this when to take this additional instructions   pantoprazole 40 MG tablet Commonly known as: PROTONIX Take 1 tablet (40 mg total) by mouth daily.   polyethylene glycol 17 g packet Commonly known as: MIRALAX / GLYCOLAX Take 17 g by mouth daily.   VITAMIN D (CHOLECALCIFEROL) PO Take 1 tablet by mouth daily.         Contact information for follow-up providers     Micki Riley, MD. Go on 11/30/2022.   Specialties: Neurology, Radiology Why: stroke clinic Contact information: [address] [place] [address] [phone]          [facility], [place]. Go on 09/25/2022.   Why: Schedule is Monday,Wednesday, Friday with 12:15 chair time.  On Friday, please arrive at 11:45 am to complete paperwork prior to treatment. Contact information: [address] [place] [address] [phone]              Contact information for after-discharge care     Destination     [facility], [facility]erred [facility] .   Service: Skilled Nursing Contact information: [address] [place] [address] [phone]                    Allergies  Allergen Reactions   Procaine Hcl Anaphylaxis   Amoxicillin Itching    Consultations: Neurology, nephrology   Procedures/Studies: IR Fluoro Guide CV Line Right  Result Date: 09/18/2022 INDICATION: Conversion of temporary hemodialysis catheter to tunneled hemodialysis catheter. EXAM: IR RIGHT FLUORO GUIDE CV LINE MEDICATIONS: 2 g Ancef; The antibiotic was administered within an appropriate time interval prior to skin puncture.  ANESTHESIA/SEDATION: Moderate (conscious) sedation was employed during this procedure. A total of Versed 1 mg and Fentanyl 75 mcg was administered intravenously. Moderate Sedation Time: 12 minutes. The patient's level of consciousness and vital signs were monitored continuously by radiology nursing throughout the procedure under my direct supervision. FLUOROSCOPY TIME:  Radiation exposure index: 1 mGy reference air kerma COMPLICATIONS: None immediate. PROCEDURE: Informed written consent was obtained from the patient after a thorough discussion of the procedural risks, benefits and alternatives. All questions were addressed. Maximal Sterile Barrier Technique was utilized including caps, mask, sterile gowns, sterile gloves, sterile drape, hand hygiene and skin antiseptic. A timeout was performed prior to the initiation of the procedure. The existing right IJ temporary hemodialysis catheter was completely prepped and  draped. A 0.035 wire was advanced through the central line lumen of the tunneled catheter, through the right atrium and into the inferior vena cava. Local anesthesia at the catheter insertion site was attained by infiltration with 1% lidocaine. Additionally, a suitable skin exit site inferior to the clavicle was anesthetized with 1% lidocaine. A small dermatotomy was made. Next, a 19 cm palindrome hemodialysis catheter was tunneled from the skin exit site to the dermatotomy where the existing temporary catheter enters the vein. Next, the existing catheter was removed over the wire. A peel-away sheath was then advanced over the wire and into the right heart. The new 19 cm palindrome hemodialysis catheter was then advanced through the peel-away sheath and the peel-away sheath was discarded. The catheter tip is in the right atrium. Images were obtained and stored for the medical record. The catheter flushes and aspirates easily. The catheter was flushed, locked with heparinized saline, capped and secured to the skin with 0 Prolene suture. Sterile bandages were applied. IMPRESSION: Successful exchange for a new 19 cm palindrome tunneled hemodialysis catheter. Catheter tips are in the right atrium and the device is ready for immediate use. Electronically Signed   By: Malachy Moan M.D.   On: 09/18/2022 12:46   IR Fluoro Guide CV Line Right  Result Date: 09/14/2022 CLINICAL DATA:  Dyspnea, chronic kidney disease EXAM: EXAM RIGHT IJ CATHETER PLACEMENT UNDER ULTRASOUND AND FLUOROSCOPIC GUIDANCE TECHNIQUE: The procedure, risks (including but not limited to bleeding, infection, organ damage, pneumothorax), benefits, and alternatives were explained to the patient. Questions regarding the procedure were encouraged and answered. The patient understands and consents to the procedure. Patency of the right IJ vein was confirmed with ultrasound with image documentation. An appropriate skin site was determined. Skin site was  marked. Region was prepped using maximum barrier technique including cap and mask, sterile gown, sterile gloves, large sterile sheet, and Chlorhexidine as cutaneous antisepsis. The region was infiltrated locally with 1% lidocaine. Under real-time ultrasound guidance, the right IJ vein was accessed with a 21 gauge needle; the needle tip within the vein was confirmed with ultrasound image documentation. The needle exchanged over a 018 guidewire for vascular dilator which allowed advancement of a 20 cm Mahurkar catheter. This was positioned with the tip at the cavoatrial junction. Spot chest radiograph shows good positioning and no pneumothorax. Catheter was flushed and sutured externally with 0-Prolene suture. Patient tolerated the procedure well. FLUOROSCOPY TIME:  Radiation Exposure Index (as provided by the fluoroscopic device): 1 mGy air Kerma COMPLICATIONS: COMPLICATIONS none IMPRESSION: 1. Technically successful right IJ Mahurkar catheter placement. Electronically Signed   By: Corlis Leak M.D.   On: 09/14/2022 14:58   IR US Guide Vasc Access Right  Result Date: 09/14/2022 CLINICAL DATA:  Dyspnea, chronic kidney disease EXAM: EXAM RIGHT IJ CATHETER PLACEMENT UNDER ULTRASOUND AND FLUOROSCOPIC GUIDANCE TECHNIQUE: The procedure, risks (including but not limited to bleeding, infection, organ damage, pneumothorax), benefits, and alternatives were explained to the patient. Questions regarding the procedure were encouraged and answered. The patient understands and consents to the procedure. Patency of the right IJ vein was confirmed with ultrasound with image documentation. An appropriate skin site was determined. Skin site was marked. Region was prepped using maximum barrier technique including cap and mask, sterile gown, sterile gloves, large sterile sheet, and Chlorhexidine as cutaneous antisepsis. The region was infiltrated locally with 1% lidocaine. Under real-time ultrasound guidance, the right IJ vein was  accessed with a 21 gauge needle; the needle tip within the vein was confirmed with ultrasound image documentation. The needle exchanged over a 018 guidewire for vascular dilator which allowed advancement of a 20 cm Mahurkar catheter. This was positioned with the tip at the cavoatrial junction. Spot chest radiograph shows good positioning and no pneumothorax. Catheter was flushed and sutured externally with 0-Prolene suture. Patient tolerated the procedure well. FLUOROSCOPY TIME:  Radiation Exposure Index (as provided by the fluoroscopic device): 1 mGy air Kerma COMPLICATIONS: COMPLICATIONS none IMPRESSION: 1. Technically successful right IJ Mahurkar catheter placement. Electronically Signed   By: Corlis Leak M.D.   On: 09/14/2022 14:58   DG Chest 2 View  Result Date: 09/11/2022 CLINICAL DATA:  Dyspnea. EXAM: CHEST - 2 VIEW COMPARISON:  September 06, 2022 FINDINGS: Normal cardiac silhouette. Calcific atherosclerotic disease and tortuosity of the aorta. Interstitial pulmonary edema.  Bilateral small pleural effusions. IMPRESSION: Interstitial pulmonary edema with bilateral small pleural effusions. Electronically Signed   By: Ted Mcalpine M.D.   On: 09/11/2022 19:54   US RENAL  Result Date: 09/07/2022 CLINICAL DATA:  Chronic kidney disease stage 3 Diabetes Hypertension EXAM: RENAL / URINARY TRACT ULTRASOUND COMPLETE COMPARISON:  CT chest abdomen pelvis 07/20/2022 FINDINGS: Right Kidney: Renal measurements: 9.2 x 3.8 x 6.0 cm = volume: 109 mL. Echogenicity within normal limits. No mass or hydronephrosis visualized. 2.8 cm simple cyst in the upper pole does not require dedicated imaging follow-up. 1.5 cm hypoechoic structure in the lower pole of the right kidney does not appear anechoic. Left Kidney: Renal measurements: 10.8 x 6.0 x 5.3 cm = volume: 179 mL. Echogenicity within normal limits. No mass. Mild left hydronephrosis, similar to prior exam. 3.4 cm simple cyst in the lower pole does not require dedicated  imaging follow-up. Bladder: Evaluation limited due to under distension. Other: None. IMPRESSION: 1. Unchanged mild left hydronephrosis. 2. 1.5 cm hypoechoic structure in the lower pole of the right kidney can not be characterized as a simple cyst given its internal echoes. It is more likely to be a mildly complicated cyst with proteinaceous or hemorrhagic debris thin a neoplasm. Recommend follow-up ultrasound in 6 months to again evaluate this finding. Electronically Signed   By: Acquanetta Belling M.D.   On: 09/07/2022 11:52   MR ANGIO NECK W WO CONTRAST  Result Date: 09/06/2022 CLINICAL DATA:  Infarct seen on noncontrast MRI EXAM: MRA NECK WITHOUT AND WITH CONTRAST MRA HEAD WITHOUT CONTRAST TECHNIQUE: Multiplanar and multiecho pulse sequences of the neck were obtained without and with intravenous contrast. Angiographic images of the neck were obtained using MRA technique without and with intravenous contrast; Angiographic images of the Circle of Willis were obtained using MRA technique without intravenous contrast. CONTRAST:  7mL GADAVIST GADOBUTROL 1 MMOL/ML IV SOLN COMPARISON:  None Available. FINDINGS: MRA NECK  FINDINGS Aortic arch: The imaged aortic arch is normal. The origins of the major branch vessels are patent. The subclavian arteries are patent to the level imaged. Right carotid system: The right common, internal, and external carotid arteries are patent, with mild irregularity at the bulb likely reflecting atherosclerotic disease but no evidence of hemodynamically significant stenosis or occlusion. There is no evidence of dissection or aneurysm. Left carotid system: The left common, internal, and external carotid arteries are patent, with mild irregularity at the bulb likely reflecting atherosclerotic disease but no evidence of hemodynamically significant stenosis or occlusion there is no evidence of dissection or aneurysm Vertebral arteries: The vertebral artery origins are not well assessed. Otherwise,  the vertebral arteries are patent with antegrade flow. There is no evidence of hemodynamically significant stenosis or occlusion there is no evidence of dissection or aneurysm. Other: None. MRA HEAD FINDINGS Image quality is degraded by motion artifact at the level of the cavernous ICAs. Anterior circulation: The intracranial ICAs are patent, without significant stenosis or occlusion. The MCAs are patent, without proximal high-grade stenosis or occlusion. The bilateral ACAS are patent, without proximal stenosis or occlusion. There is no definite aneurysm. Posterior circulation: The bilateral V4 segments are patent. The basilar artery is patent. The major cerebellar arteries appear patent. The bilateral PCAs are patent, without proximal high-grade stenosis or occlusion. A left posterior communicating artery is identified. There is no definite aneurysm. Anatomic variants: None. Other: None. IMPRESSION: Patent vasculature of the head and neck without evidence of hemodynamically significant stenosis, occlusion, or dissection. Electronically Signed   By: Lesia Hausen M.D.   On: 09/06/2022 17:14   MR ANGIO HEAD WO CONTRAST  Result Date: 09/06/2022 CLINICAL DATA:  Infarct seen on noncontrast MRI EXAM: MRA NECK WITHOUT AND WITH CONTRAST MRA HEAD WITHOUT CONTRAST TECHNIQUE: Multiplanar and multiecho pulse sequences of the neck were obtained without and with intravenous contrast. Angiographic images of the neck were obtained using MRA technique without and with intravenous contrast; Angiographic images of the Circle of Willis were obtained using MRA technique without intravenous contrast. CONTRAST:  7mL GADAVIST GADOBUTROL 1 MMOL/ML IV SOLN COMPARISON:  None Available. FINDINGS: MRA NECK FINDINGS Aortic arch: The imaged aortic arch is normal. The origins of the major branch vessels are patent. The subclavian arteries are patent to the level imaged. Right carotid system: The right common, internal, and external carotid  arteries are patent, with mild irregularity at the bulb likely reflecting atherosclerotic disease but no evidence of hemodynamically significant stenosis or occlusion. There is no evidence of dissection or aneurysm. Left carotid system: The left common, internal, and external carotid arteries are patent, with mild irregularity at the bulb likely reflecting atherosclerotic disease but no evidence of hemodynamically significant stenosis or occlusion there is no evidence of dissection or aneurysm Vertebral arteries: The vertebral artery origins are not well assessed. Otherwise, the vertebral arteries are patent with antegrade flow. There is no evidence of hemodynamically significant stenosis or occlusion there is no evidence of dissection or aneurysm. Other: None. MRA HEAD FINDINGS Image quality is degraded by motion artifact at the level of the cavernous ICAs. Anterior circulation: The intracranial ICAs are patent, without significant stenosis or occlusion. The MCAs are patent, without proximal high-grade stenosis or occlusion. The bilateral ACAS are patent, without proximal stenosis or occlusion. There is no definite aneurysm. Posterior circulation: The bilateral V4 segments are patent. The basilar artery is patent. The major cerebellar arteries appear patent. The bilateral PCAs are patent, without proximal high-grade stenosis  or occlusion. A left posterior communicating artery is identified. There is no definite aneurysm. Anatomic variants: None. Other: None. IMPRESSION: Patent vasculature of the head and neck without evidence of hemodynamically significant stenosis, occlusion, or dissection. Electronically Signed   By: Lesia Hausen M.D.   On: 09/06/2022 17:14   MR BRAIN WO CONTRAST  Result Date: 09/06/2022 CLINICAL DATA:  Neuro deficit with acute stroke suspected EXAM: MRI HEAD WITHOUT CONTRAST TECHNIQUE: Multiplanar, multiecho pulse sequences of the brain and surrounding structures were obtained without  intravenous contrast. 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 Small acute infarct in the postcentral gyrus on the right chronic small vessel ischemia to a moderate degree. No acute hemorrhage, hydrocephalus, or masslike finding Vascular: Normal flow voids. Skull and upper cervical spine: Normal marrow signal. Sinuses/Orbits: Negative. IMPRESSION: 1. 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 2. 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 Electronically Signed   By: Tiburcio Pea M.D.   On: 09/06/2022 11:40   DG Chest Portable 1 View  Result Date: 09/06/2022 CLINICAL DATA:  Pain EXAM: PORTABLE CHEST 1 VIEW COMPARISON:  08/07/2022 FINDINGS: Stable cardiomediastinal contours. Aortic atherosclerosis. Perihilar and bibasilar interstitial opacities. No pleural effusion or pneumothorax. IMPRESSION: Perihilar and bibasilar interstitial opacities, which may reflect edema or atypical infection. Electronically Signed   By: Duanne Guess D.O.   On: 09/06/2022 10:14      Subjective: Patient seen and examined bedside this afternoon.  Husband at bedside too.  She just came from dialysis.  She did not complain of anything specific .  Comfortable sitting in the chair.  Discussed about discharge planning to skilled nursing facility tomorrow  Discharge Exam: Vitals:   09/23/22 1957 09/24/22 0756  BP: 106/63 116/62  Pulse: 82 81  Resp: 19 18  Temp: 98.1 F (36.7 C) 97.8 F (36.6 C)  SpO2: 100% 95%   Vitals:   09/23/22 1547 09/23/22 1957 09/24/22 0500 09/24/22 0756  BP: (!) 99/57 106/63  116/62  Pulse: 81 82  81  Resp: 18 19  18   Temp: 98 F (36.7 C) 98.1 F (36.7 C)  97.8 F (36.6 C)  TempSrc: Oral Oral  Oral  SpO2: 97% 100%  95%  Weight:   77.3 kg    Height:        General: Pt is alert, awake, not in acute distress Cardiovascular: RRR, S1/S2 +, no rubs, no gallops, dialysis catheter on the right chest Respiratory: CTA bilaterally, no wheezing, no rhonchi Abdominal: Soft, NT, ND, bowel sounds + Extremities: no edema, no cyanosis    The results of significant diagnostics from this hospitalization (including imaging, microbiology, ancillary and laboratory) are listed below for reference.     Microbiology: No results found for this or any previous visit (from the past 240 hour(s)).   Labs: BNP (last 3 results) Recent Labs    07/20/22 1030 08/03/22 1710  BNP 1,142.6* 1,094.9*   Basic Metabolic Panel: Recent Labs  Lab 09/19/22 1101 09/20/22 0425 09/21/22 0500 09/21/22 2341 09/23/22 1847  NA 134* 132* 131* 133* 132*  K 3.6 3.7 3.7 4.0 3.5  CL 98 100 99 95* 94*  CO2 27 26 27 30 29   GLUCOSE 166* 192* 209* 250* 121*  BUN 29* 32* 35* 19 11  CREATININE 2.12* 2.35* 2.90* 2.13* 1.78*  CALCIUM 7.7* 7.5* 7.8* 8.0* 8.0*  PHOS  --   --   --   --  2.3*   Liver Function Tests: Recent Labs  Lab 09/20/22 0425 09/23/22 1847  AST 26  --   ALT 9  --   ALKPHOS 74  --   BILITOT 0.5  --   PROT 5.1*  --   ALBUMIN <1.5* 1.6*   No results for input(s): "LIPASE", "AMYLASE" in the last 168 hours. No results for input(s): "AMMONIA" in the last 168 hours. CBC: Recent Labs  Lab 09/19/22 1101 09/20/22 0425 09/21/22 0500 09/21/22 2341 09/23/22 1847  WBC 12.2* 11.1* 11.5* 12.6* 11.4*  HGB 8.2* 8.1* 7.8* 8.2* 8.8*  HCT 26.6* 26.2* 25.2* 26.2* 28.3*  MCV 95.3 96.3 98.4 97.4 95.0  PLT 272 268 289 296 381   Cardiac Enzymes: No results for input(s): "CKTOTAL", "CKMB", "CKMBINDEX", "TROPONINI" in the last 168 hours. BNP: Invalid input(s): "POCBNP" CBG: Recent Labs  Lab 09/23/22 0734 09/23/22 1213 09/23/22 1609 09/23/22 2136 09/24/22 0608  GLUCAP 96 83 175* 103* 68*   D-Dimer No results for input(s): "DDIMER" in the last  72 hours. Hgb A1c No results for input(s): "HGBA1C" in the last 72 hours. Lipid Profile No results for input(s): "CHOL", "HDL", "LDLCALC", "TRIG", "CHOLHDL", "LDLDIRECT" in the last 72 hours. Thyroid function studies No results for input(s): "TSH", "T4TOTAL", "T3FREE", "THYROIDAB" in the last 72 hours.  Invalid input(s): "FREET3" Anemia work up No results for input(s): "VITAMINB12", "FOLATE", "FERRITIN", "TIBC", "IRON", "RETICCTPCT" in the last 72 hours. Urinalysis    Component Value Date/Time   COLORURINE YELLOW 09/06/2022 1140   APPEARANCEUR TURBID (A) 09/06/2022 1140   LABSPEC 1.011 09/06/2022 1140   PHURINE 5.0 09/06/2022 1140   GLUCOSEU >=500 (A) 09/06/2022 1140   GLUCOSEU NEGATIVE 05/06/2022 1103   HGBUR LARGE (A) 09/06/2022 1140   BILIRUBINUR NEGATIVE 09/06/2022 1140   BILIRUBINUR neg 05/16/2015 1928   KETONESUR NEGATIVE 09/06/2022 1140   PROTEINUR 100 (A) 09/06/2022 1140   UROBILINOGEN 0.2 05/06/2022 1103   NITRITE NEGATIVE 09/06/2022 1140   LEUKOCYTESUR LARGE (A) 09/06/2022 1140   Sepsis Labs Recent Labs  Lab 09/20/22 0425 09/21/22 0500 09/21/22 2341 09/23/22 1847  WBC 11.1* 11.5* 12.6* 11.4*   Microbiology No results found for this or any previous visit (from the past 240 hour(s)).  Please note: You were cared for by a hospitalist during your hospital stay. Once you are discharged, your primary care physician will handle any further medical issues. Please note that NO REFILLS for any discharge medications will be authorized once you are discharged, as it is imperative that you return to your primary care physician (or establish a relationship with a primary care physician if you do not have one) for your post hospital discharge needs so that they can reassess your need for medications and monitor your lab values.    Time coordinating discharge: 40 minutes  SIGNED:   Burnadette Pop, MD  [facility] 09/24/2022, 9:19 AM Pager [phone]  If  7PM-7AM, please contact night-coverage [email or web address] [facility] Physician Discharge Summary  [name] [name] UJW:[id number] DOB: 1941-02-02 DOA: 09/06/2022  PCP: [name], MD  Admit date: 09/06/2022 Discharge date: 09/24/2022  Admitted From: Home Disposition:  Home  Discharge Condition:Stable CODE STATUS:FULL, DNR, Comfort Care Diet recommendation: Heart Healthy / Carb Modified / Regular / Dysphagia   Brief/Interim Summary:   Following problems were addressed during the hospitalization:   Discharge Diagnoses:  Principal Problem:   Acute renal failure  superimposed on stage 4 chronic kidney disease (HCC) Active Problems:   Acute CVA (cerebrovascular accident) (HCC)   ESBL Bacteremia due to UTI   Hyponatremia   Hyperlipidemia   Essential hypertension   Paroxysmal atrial fibrillation (HCC)   Seizure disorder (HCC)   Diabetes (HCC)   CAD S/P percutaneous coronary angioplasty   CKD (chronic kidney disease) stage 4, GFR 15-29 ml/min (HCC)   Anemia in chronic kidney disease (CKD)   Chronic systolic CHF (congestive heart failure) (HCC)   Hyperkalemia    Discharge Instructions  Discharge Instructions     Diet - low sodium heart healthy   Complete by: As directed    Discharge instructions   Complete by: As directed    1)Please take your medications as instructed   Increase activity slowly   Complete by: As directed    No wound care   Complete by: As directed       Allergies as of 09/24/2022       Reactions   Procaine Hcl Anaphylaxis   Amoxicillin Itching        Medication List     STOP taking these medications    furosemide 40 MG tablet Commonly known as: LASIX   metoprolol succinate 50 MG 24 hr tablet Commonly known as: TOPROL-XL   Nitroglycerin 0.4 % Oint       TAKE these medications    alendronate 35 MG tablet Commonly known as: FOSAMAX Take 35 mg by mouth every 7 (seven) days. Take with a full glass of water on an empty stomach.    amiodarone 200 MG tablet Commonly known as: PACERONE Take 1 tablet (200 mg total) by mouth daily.   apixaban 2.5 MG Tabs tablet Commonly known as: ELIQUIS Take 1 tablet (2.5 mg total) by mouth 2 (two) times daily. To start likely on Friday 6/14 if recommended by Coumadin clinic after labs done   atorvastatin 40 MG tablet Commonly known as: LIPITOR Take 1 tablet (40 mg total) by mouth daily. What changed:  medication strength how much to take   ezetimibe 10 MG tablet Commonly known as: ZETIA Take 1 tablet (10 mg total) by mouth daily.   hydrocortisone 2.5 % cream Apply topically 2 (two) times daily.   MULTIVITAMIN ADULT PO Take 1 tablet by mouth daily with breakfast.   NovoLIN 70/30 ReliOn (70-30) 100 UNIT/ML injection Generic drug: insulin NPH-regular Human 10 units in the morning and 5 units at night. What changed:  how much to take how to take this when to take this additional instructions   pantoprazole 40 MG tablet Commonly known as: PROTONIX Take 1 tablet (40 mg total) by mouth daily.   polyethylene glycol 17 g packet Commonly known as: MIRALAX / GLYCOLAX Take 17 g by mouth daily.   VITAMIN D (CHOLECALCIFEROL) PO Take 1 tablet by mouth daily.         Contact information for follow-up providers     Micki Riley, MD. Go on 11/30/2022.   Specialties: Neurology, Radiology Why: stroke clinic Contact information: [address] [place] [address] [phone]         [facility] [place]. Go on 09/25/2022.   Why: Schedule is Monday,Wednesday, Friday with 12:15 chair time.  On Friday, please arrive at 11:45 am to complete paperwork prior to treatment. Contact information: [address] [place] [address] [phone]              Contact information for after-discharge care  Destination     [facility] Preferred SNF .   Service: Skilled Nursing Contact information: [address] [place] [address] [phone]                    Allergies  Allergen Reactions   Procaine Hcl Anaphylaxis   Amoxicillin Itching    Consultations:    Procedures/Studies: IR Fluoro Guide CV Line Right  Result Date: 09/18/2022 INDICATION: Conversion of temporary hemodialysis catheter to tunneled hemodialysis catheter. EXAM: IR RIGHT FLUORO GUIDE CV LINE MEDICATIONS: 2 g Ancef; The antibiotic was administered within an appropriate time interval prior to skin puncture. ANESTHESIA/SEDATION: Moderate (conscious) sedation was employed during this procedure. A total of Versed 1 mg and Fentanyl 75 mcg was administered intravenously. Moderate Sedation Time: 12 minutes. The patient's level of consciousness and vital signs were monitored continuously by radiology nursing throughout the procedure under my direct supervision. FLUOROSCOPY TIME:  Radiation exposure index: 1 mGy reference air kerma COMPLICATIONS: None immediate. PROCEDURE: Informed written consent was obtained from the patient after a thorough discussion of the procedural risks, benefits and alternatives. All questions were addressed. Maximal Sterile Barrier Technique was utilized including caps, mask, sterile gowns, sterile gloves, sterile drape, hand hygiene and skin antiseptic. A timeout was performed prior to the initiation of the procedure. The existing right IJ temporary hemodialysis catheter was completely prepped and draped. A 0.035 wire was advanced through the central line lumen of the tunneled catheter, through the right atrium and into the inferior vena cava. Local anesthesia at the catheter insertion site was attained by infiltration with 1% lidocaine. Additionally, a suitable skin exit site inferior to the clavicle was anesthetized with 1% lidocaine. A small dermatotomy was made. Next, a 19 cm palindrome hemodialysis catheter was tunneled from the skin exit site to the dermatotomy where the existing  temporary catheter enters the vein. Next, the existing catheter was removed over the wire. A peel-away sheath was then advanced over the wire and into the right heart. The new 19 cm palindrome hemodialysis catheter was then advanced through the peel-away sheath and the peel-away sheath was discarded. The catheter tip is in the right atrium. Images were obtained and stored for the medical record. The catheter flushes and aspirates easily. The catheter was flushed, locked with heparinized saline, capped and secured to the skin with 0 Prolene suture. Sterile bandages were applied. IMPRESSION: Successful exchange for a new 19 cm palindrome tunneled hemodialysis catheter. Catheter tips are in the right atrium and the device is ready for immediate use. Electronically Signed   By: Malachy Moan M.D.   On: 09/18/2022 12:46   IR Fluoro Guide CV Line Right  Result Date: 09/14/2022 CLINICAL DATA:  Dyspnea, chronic kidney disease EXAM: EXAM RIGHT IJ CATHETER PLACEMENT UNDER ULTRASOUND AND FLUOROSCOPIC GUIDANCE TECHNIQUE: The procedure, risks (including but not limited to bleeding, infection, organ damage, pneumothorax), benefits, and alternatives were explained to the patient. Questions regarding the procedure were encouraged and answered. The patient understands and consents to the procedure. Patency of the right IJ vein was confirmed with ultrasound with image documentation. An appropriate skin site was determined. Skin site was marked. Region was prepped using maximum barrier technique including cap and mask, sterile gown, sterile gloves, large sterile sheet, and Chlorhexidine as cutaneous antisepsis. The region was infiltrated locally with 1% lidocaine. Under real-time ultrasound guidance, the right IJ vein was accessed with a 21 gauge needle; the needle tip within the vein was confirmed  with ultrasound image documentation. The needle exchanged over a 018 guidewire for vascular dilator which allowed advancement of a  20 cm Mahurkar catheter. This was positioned with the tip at the cavoatrial junction. Spot chest radiograph shows good positioning and no pneumothorax. Catheter was flushed and sutured externally with 0-Prolene suture. Patient tolerated the procedure well. FLUOROSCOPY TIME:  Radiation Exposure Index (as provided by the fluoroscopic device): 1 mGy air Kerma COMPLICATIONS: COMPLICATIONS none IMPRESSION: 1. Technically successful right IJ Mahurkar catheter placement. Electronically Signed   By: Corlis Leak M.D.   On: 09/14/2022 14:58   IR US Guide Vasc Access Right  Result Date: 09/14/2022 CLINICAL DATA:  Dyspnea, chronic kidney disease EXAM: EXAM RIGHT IJ CATHETER PLACEMENT UNDER ULTRASOUND AND FLUOROSCOPIC GUIDANCE TECHNIQUE: The procedure, risks (including but not limited to bleeding, infection, organ damage, pneumothorax), benefits, and alternatives were explained to the patient. Questions regarding the procedure were encouraged and answered. The patient understands and consents to the procedure. Patency of the right IJ vein was confirmed with ultrasound with image documentation. An appropriate skin site was determined. Skin site was marked. Region was prepped using maximum barrier technique including cap and mask, sterile gown, sterile gloves, large sterile sheet, and Chlorhexidine as cutaneous antisepsis. The region was infiltrated locally with 1% lidocaine. Under real-time ultrasound guidance, the right IJ vein was accessed with a 21 gauge needle; the needle tip within the vein was confirmed with ultrasound image documentation. The needle exchanged over a 018 guidewire for vascular dilator which allowed advancement of a 20 cm Mahurkar catheter. This was positioned with the tip at the cavoatrial junction. Spot chest radiograph shows good positioning and no pneumothorax. Catheter was flushed and sutured externally with 0-Prolene suture. Patient tolerated the procedure well. FLUOROSCOPY TIME:  Radiation Exposure  Index (as provided by the fluoroscopic device): 1 mGy air Kerma COMPLICATIONS: COMPLICATIONS none IMPRESSION: 1. Technically successful right IJ Mahurkar catheter placement. Electronically Signed   By: Corlis Leak M.D.   On: 09/14/2022 14:58   DG Chest 2 View  Result Date: 09/11/2022 CLINICAL DATA:  Dyspnea. EXAM: CHEST - 2 VIEW COMPARISON:  September 06, 2022 FINDINGS: Normal cardiac silhouette. Calcific atherosclerotic disease and tortuosity of the aorta. Interstitial pulmonary edema.  Bilateral small pleural effusions. IMPRESSION: Interstitial pulmonary edema with bilateral small pleural effusions. Electronically Signed   By: Ted Mcalpine M.D.   On: 09/11/2022 19:54   US RENAL  Result Date: 09/07/2022 CLINICAL DATA:  Chronic kidney disease stage 3 Diabetes Hypertension EXAM: RENAL / URINARY TRACT ULTRASOUND COMPLETE COMPARISON:  CT chest abdomen pelvis 07/20/2022 FINDINGS: Right Kidney: Renal measurements: 9.2 x 3.8 x 6.0 cm = volume: 109 mL. Echogenicity within normal limits. No mass or hydronephrosis visualized. 2.8 cm simple cyst in the upper pole does not require dedicated imaging follow-up. 1.5 cm hypoechoic structure in the lower pole of the right kidney does not appear anechoic. Left Kidney: Renal measurements: 10.8 x 6.0 x 5.3 cm = volume: 179 mL. Echogenicity within normal limits. No mass. Mild left hydronephrosis, similar to prior exam. 3.4 cm simple cyst in the lower pole does not require dedicated imaging follow-up. Bladder: Evaluation limited due to under distension. Other: None. IMPRESSION: 1. Unchanged mild left hydronephrosis. 2. 1.5 cm hypoechoic structure in the lower pole of the right kidney can not be characterized as a simple cyst given its internal echoes. It is more likely to be a mildly complicated cyst with proteinaceous or hemorrhagic debris thin a neoplasm. Recommend follow-up ultrasound in 6  months to again evaluate this finding. Electronically Signed   By: Acquanetta Belling M.D.    On: 09/07/2022 11:52   MR ANGIO NECK W WO CONTRAST  Result Date: 09/06/2022 CLINICAL DATA:  Infarct seen on noncontrast MRI EXAM: MRA NECK WITHOUT AND WITH CONTRAST MRA HEAD WITHOUT CONTRAST TECHNIQUE: Multiplanar and multiecho pulse sequences of the neck were obtained without and with intravenous contrast. Angiographic images of the neck were obtained using MRA technique without and with intravenous contrast; Angiographic images of the Circle of Willis were obtained using MRA technique without intravenous contrast. CONTRAST:  7mL GADAVIST GADOBUTROL 1 MMOL/ML IV SOLN COMPARISON:  None Available. FINDINGS: MRA NECK FINDINGS Aortic arch: The imaged aortic arch is normal. The origins of the major branch vessels are patent. The subclavian arteries are patent to the level imaged. Right carotid system: The right common, internal, and external carotid arteries are patent, with mild irregularity at the bulb likely reflecting atherosclerotic disease but no evidence of hemodynamically significant stenosis or occlusion. There is no evidence of dissection or aneurysm. Left carotid system: The left common, internal, and external carotid arteries are patent, with mild irregularity at the bulb likely reflecting atherosclerotic disease but no evidence of hemodynamically significant stenosis or occlusion there is no evidence of dissection or aneurysm Vertebral arteries: The vertebral artery origins are not well assessed. Otherwise, the vertebral arteries are patent with antegrade flow. There is no evidence of hemodynamically significant stenosis or occlusion there is no evidence of dissection or aneurysm. Other: None. MRA HEAD FINDINGS Image quality is degraded by motion artifact at the level of the cavernous ICAs. Anterior circulation: The intracranial ICAs are patent, without significant stenosis or occlusion. The MCAs are patent, without proximal high-grade stenosis or occlusion. The bilateral ACAS are patent, without  proximal stenosis or occlusion. There is no definite aneurysm. Posterior circulation: The bilateral V4 segments are patent. The basilar artery is patent. The major cerebellar arteries appear patent. The bilateral PCAs are patent, without proximal high-grade stenosis or occlusion. A left posterior communicating artery is identified. There is no definite aneurysm. Anatomic variants: None. Other: None. IMPRESSION: Patent vasculature of the head and neck without evidence of hemodynamically significant stenosis, occlusion, or dissection. Electronically Signed   By: Lesia Hausen M.D.   On: 09/06/2022 17:14   MR ANGIO HEAD WO CONTRAST  Result Date: 09/06/2022 CLINICAL DATA:  Infarct seen on noncontrast MRI EXAM: MRA NECK WITHOUT AND WITH CONTRAST MRA HEAD WITHOUT CONTRAST TECHNIQUE: Multiplanar and multiecho pulse sequences of the neck were obtained without and with intravenous contrast. Angiographic images of the neck were obtained using MRA technique without and with intravenous contrast; Angiographic images of the Circle of Willis were obtained using MRA technique without intravenous contrast. CONTRAST:  7mL GADAVIST GADOBUTROL 1 MMOL/ML IV SOLN COMPARISON:  None Available. FINDINGS: MRA NECK FINDINGS Aortic arch: The imaged aortic arch is normal. The origins of the major branch vessels are patent. The subclavian arteries are patent to the level imaged. Right carotid system: The right common, internal, and external carotid arteries are patent, with mild irregularity at the bulb likely reflecting atherosclerotic disease but no evidence of hemodynamically significant stenosis or occlusion. There is no evidence of dissection or aneurysm. Left carotid system: The left common, internal, and external carotid arteries are patent, with mild irregularity at the bulb likely reflecting atherosclerotic disease but no evidence of hemodynamically significant stenosis or occlusion there is no evidence of dissection or aneurysm  Vertebral arteries: The vertebral artery origins are  not well assessed. Otherwise, the vertebral arteries are patent with antegrade flow. There is no evidence of hemodynamically significant stenosis or occlusion there is no evidence of dissection or aneurysm. Other: None. MRA HEAD FINDINGS Image quality is degraded by motion artifact at the level of the cavernous ICAs. Anterior circulation: The intracranial ICAs are patent, without significant stenosis or occlusion. The MCAs are patent, without proximal high-grade stenosis or occlusion. The bilateral ACAS are patent, without proximal stenosis or occlusion. There is no definite aneurysm. Posterior circulation: The bilateral V4 segments are patent. The basilar artery is patent. The major cerebellar arteries appear patent. The bilateral PCAs are patent, without proximal high-grade stenosis or occlusion. A left posterior communicating artery is identified. There is no definite aneurysm. Anatomic variants: None. Other: None. IMPRESSION: Patent vasculature of the head and neck without evidence of hemodynamically significant stenosis, occlusion, or dissection. Electronically Signed   By: Lesia Hausen M.D.   On: 09/06/2022 17:14   MR BRAIN WO CONTRAST  Result Date: 09/06/2022 CLINICAL DATA:  Neuro deficit with acute stroke suspected EXAM: MRI HEAD WITHOUT CONTRAST TECHNIQUE: Multiplanar, multiecho pulse sequences of the brain and surrounding structures were obtained without intravenous contrast. 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 Small acute infarct in the postcentral gyrus on the right chronic small vessel ischemia to a moderate degree. No acute hemorrhage, hydrocephalus, or masslike finding Vascular: Normal flow voids. Skull and upper cervical spine: Normal marrow signal. Sinuses/Orbits:  Negative. IMPRESSION: 1. 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 2. 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 Electronically Signed   By: Tiburcio Pea M.D.   On: 09/06/2022 11:40   DG Chest Portable 1 View  Result Date: 09/06/2022 CLINICAL DATA:  Pain EXAM: PORTABLE CHEST 1 VIEW COMPARISON:  08/07/2022 FINDINGS: Stable cardiomediastinal contours. Aortic atherosclerosis. Perihilar and bibasilar interstitial opacities. No pleural effusion or pneumothorax. IMPRESSION: Perihilar and bibasilar interstitial opacities, which may reflect edema or atypical infection. Electronically Signed   By: Duanne Guess D.O.   On: 09/06/2022 10:14      Subjective:   Discharge Exam: Vitals:   09/23/22 1957 09/24/22 0756  BP: 106/63 116/62  Pulse: 82 81  Resp: 19 18  Temp: 98.1 F (36.7 C) 97.8 F (36.6 C)  SpO2: 100% 95%   Vitals:   09/23/22 1547 09/23/22 1957 09/24/22 0500 09/24/22 0756  BP: (!) 99/57 106/63  116/62  Pulse: 81 82  81  Resp: 18 19  18   Temp: 98 F (36.7 C) 98.1 F (36.7 C)  97.8 F (36.6 C)  TempSrc: Oral Oral  Oral  SpO2: 97% 100%  95%  Weight:   77.3 kg   Height:        General: Pt is alert, awake, not in acute distress Cardiovascular: RRR, S1/S2 +, no rubs, no gallops Respiratory: CTA bilaterally, no wheezing, no rhonchi Abdominal: Soft, NT, ND, bowel sounds + Extremities: no edema, no cyanosis    The results of significant diagnostics from this hospitalization (including imaging, microbiology, ancillary and laboratory) are listed below for reference.     Microbiology: No results found for this or any previous visit (from the past 240 hour(s)).   Labs: BNP (last 3 results) Recent Labs    07/20/22 1030 08/03/22 1710  BNP 1,142.6* 1,094.9*   Basic Metabolic  Panel: Recent Labs  Lab 09/19/22 1101 09/20/22 0425 09/21/22 0500 09/21/22 2341 09/23/22 1847  NA 134* 132* 131* 133*  132*  K 3.6 3.7 3.7 4.0 3.5  CL 98 100 99 95* 94*  CO2 27 26 27 30 29   GLUCOSE 166* 192* 209* 250* 121*  BUN 29* 32* 35* 19 11  CREATININE 2.12* 2.35* 2.90* 2.13* 1.78*  CALCIUM 7.7* 7.5* 7.8* 8.0* 8.0*  PHOS  --   --   --   --  2.3*   Liver Function Tests: Recent Labs  Lab 09/20/22 0425 09/23/22 1847  AST 26  --   ALT 9  --   ALKPHOS 74  --   BILITOT 0.5  --   PROT 5.1*  --   ALBUMIN <1.5* 1.6*   No results for input(s): "LIPASE", "AMYLASE" in the last 168 hours. No results for input(s): "AMMONIA" in the last 168 hours. CBC: Recent Labs  Lab 09/19/22 1101 09/20/22 0425 09/21/22 0500 09/21/22 2341 09/23/22 1847  WBC 12.2* 11.1* 11.5* 12.6* 11.4*  HGB 8.2* 8.1* 7.8* 8.2* 8.8*  HCT 26.6* 26.2* 25.2* 26.2* 28.3*  MCV 95.3 96.3 98.4 97.4 95.0  PLT 272 268 289 296 381   Cardiac Enzymes: No results for input(s): "CKTOTAL", "CKMB", "CKMBINDEX", "TROPONINI" in the last 168 hours. BNP: Invalid input(s): "POCBNP" CBG: Recent Labs  Lab 09/23/22 0734 09/23/22 1213 09/23/22 1609 09/23/22 2136 09/24/22 0608  GLUCAP 96 83 175* 103* 68*   D-Dimer No results for input(s): "DDIMER" in the last 72 hours. Hgb A1c No results for input(s): "HGBA1C" in the last 72 hours. Lipid Profile No results for input(s): "CHOL", "HDL", "LDLCALC", "TRIG", "CHOLHDL", "LDLDIRECT" in the last 72 hours. Thyroid function studies No results for input(s): "TSH", "T4TOTAL", "T3FREE", "THYROIDAB" in the last 72 hours.  Invalid input(s): "FREET3" Anemia work up No results for input(s): "VITAMINB12", "FOLATE", "FERRITIN", "TIBC", "IRON", "RETICCTPCT" in the last 72 hours. Urinalysis    Component Value Date/Time   COLORURINE YELLOW 09/06/2022 1140   APPEARANCEUR TURBID (A) 09/06/2022 1140   LABSPEC 1.011 09/06/2022 1140   PHURINE 5.0 09/06/2022 1140   GLUCOSEU >=500 (A) 09/06/2022 1140   GLUCOSEU NEGATIVE 05/06/2022 1103   HGBUR LARGE (A) 09/06/2022 1140   BILIRUBINUR NEGATIVE 09/06/2022  1140   BILIRUBINUR neg 05/16/2015 1928   KETONESUR NEGATIVE 09/06/2022 1140   PROTEINUR 100 (A) 09/06/2022 1140   UROBILINOGEN 0.2 05/06/2022 1103   NITRITE NEGATIVE 09/06/2022 1140   LEUKOCYTESUR LARGE (A) 09/06/2022 1140   Sepsis Labs Recent Labs  Lab 09/20/22 0425 09/21/22 0500 09/21/22 2341 09/23/22 1847  WBC 11.1* 11.5* 12.6* 11.4*   Microbiology No results found for this or any previous visit (from the past 240 hour(s)).  Please note: You were cared for by a hospitalist during your hospital stay. Once you are discharged, your primary care physician will handle any further medical issues. Please note that NO REFILLS for any discharge medications will be authorized once you are discharged, as it is imperative that you return to your primary care physician (or establish a relationship with a primary care physician if you do not have one) for your post hospital discharge needs so that they can reassess your need for medications and monitor your lab values.    Time coordinating discharge: 40 minutes  SIGNED:   Burnadette Pop, MD  [facility] 09/24/2022, 9:19 AM Pager [phone]  If 7PM-7AM, please contact night-coverage [email or web address] [facility] Physician Discharge Summary  [name] [name] UJW:[id number] DOB: Oct 21, 1940 DOA:  09/06/2022  PCP: [name], MD  Admit date: 09/06/2022 Discharge date: 09/24/2022  Admitted From: Home Disposition:  Home  Discharge Condition:Stable CODE STATUS:FULL, DNR, Comfort Care Diet recommendation: Heart Healthy / Carb Modified / Regular / Dysphagia   Brief/Interim Summary:   Following problems were addressed during the hospitalization:   Discharge Diagnoses:  Principal Problem:   Acute renal failure superimposed on stage 4 chronic kidney disease (HCC) Active Problems:   Acute CVA (cerebrovascular accident) (HCC)   ESBL Bacteremia due to UTI   Hyponatremia   Hyperlipidemia   Essential hypertension   Paroxysmal  atrial fibrillation (HCC)   Seizure disorder (HCC)   Diabetes (HCC)   CAD S/P percutaneous coronary angioplasty   CKD (chronic kidney disease) stage 4, GFR 15-29 ml/min (HCC)   Anemia in chronic kidney disease (CKD)   Chronic systolic CHF (congestive heart failure) (HCC)   Hyperkalemia    Discharge Instructions  Discharge Instructions     Diet - low sodium heart healthy   Complete by: As directed    Discharge instructions   Complete by: As directed    1)Please take your medications as instructed   Increase activity slowly   Complete by: As directed    No wound care   Complete by: As directed       Allergies as of 09/24/2022       Reactions   Procaine Hcl Anaphylaxis   Amoxicillin Itching        Medication List     STOP taking these medications    furosemide 40 MG tablet Commonly known as: LASIX   metoprolol succinate 50 MG 24 hr tablet Commonly known as: TOPROL-XL   Nitroglycerin 0.4 % Oint       TAKE these medications    alendronate 35 MG tablet Commonly known as: FOSAMAX Take 35 mg by mouth every 7 (seven) days. Take with a full glass of water on an empty stomach.   amiodarone 200 MG tablet Commonly known as: PACERONE Take 1 tablet (200 mg total) by mouth daily.   apixaban 2.5 MG Tabs tablet Commonly known as: ELIQUIS Take 1 tablet (2.5 mg total) by mouth 2 (two) times daily. To start likely on Friday 6/14 if recommended by Coumadin clinic after labs done   atorvastatin 40 MG tablet Commonly known as: LIPITOR Take 1 tablet (40 mg total) by mouth daily. What changed:  medication strength how much to take   ezetimibe 10 MG tablet Commonly known as: ZETIA Take 1 tablet (10 mg total) by mouth daily.   hydrocortisone 2.5 % cream Apply topically 2 (two) times daily.   MULTIVITAMIN ADULT PO Take 1 tablet by mouth daily with breakfast.   NovoLIN 70/30 ReliOn (70-30) 100 UNIT/ML injection Generic drug: insulin NPH-regular Human 10 units in  the morning and 5 units at night. What changed:  how much to take how to take this when to take this additional instructions   pantoprazole 40 MG tablet Commonly known as: PROTONIX Take 1 tablet (40 mg total) by mouth daily.   polyethylene glycol 17 g packet Commonly known as: MIRALAX / GLYCOLAX Take 17 g by mouth daily.   VITAMIN D (CHOLECALCIFEROL) PO Take 1 tablet by mouth daily.         Contact information for follow-up providers     Micki Riley, MD. Go on 11/30/2022.   Specialties: Neurology, Radiology Why: stroke clinic Contact information: [address] [place] [address] [phone]  Center, [place]. Go on 09/25/2022.   Why: Schedule is Monday,Wednesday, Friday with 12:15 chair time.  On Friday, please arrive at 11:45 am to complete paperwork prior to treatment. Contact information: [address] [place] [address] [phone]              Contact information for after-discharge care     Destination     [facility], [facility] .   Service: Skilled Nursing Contact information: [address] [place] [address] [phone]                    Allergies  Allergen Reactions   Procaine Hcl Anaphylaxis   Amoxicillin Itching    Consultations:    Procedures/Studies: IR Fluoro Guide CV Line Right  Result Date: 09/18/2022 INDICATION: Conversion of temporary hemodialysis catheter to tunneled hemodialysis catheter. EXAM: IR RIGHT FLUORO GUIDE CV LINE MEDICATIONS: 2 g Ancef; The antibiotic was administered within an appropriate time interval prior to skin puncture. ANESTHESIA/SEDATION: Moderate (conscious) sedation was employed during this procedure. A total of Versed 1 mg and Fentanyl 75 mcg was administered intravenously. Moderate Sedation Time: 12 minutes. The patient's level of consciousness and vital signs were monitored continuously by radiology  nursing throughout the procedure under my direct supervision. FLUOROSCOPY TIME:  Radiation exposure index: 1 mGy reference air kerma COMPLICATIONS: None immediate. PROCEDURE: Informed written consent was obtained from the patient after a thorough discussion of the procedural risks, benefits and alternatives. All questions were addressed. Maximal Sterile Barrier Technique was utilized including caps, mask, sterile gowns, sterile gloves, sterile drape, hand hygiene and skin antiseptic. A timeout was performed prior to the initiation of the procedure. The existing right IJ temporary hemodialysis catheter was completely prepped and draped. A 0.035 wire was advanced through the central line lumen of the tunneled catheter, through the right atrium and into the inferior vena cava. Local anesthesia at the catheter insertion site was attained by infiltration with 1% lidocaine. Additionally, a suitable skin exit site inferior to the clavicle was anesthetized with 1% lidocaine. A small dermatotomy was made. Next, a 19 cm palindrome hemodialysis catheter was tunneled from the skin exit site to the dermatotomy where the existing temporary catheter enters the vein. Next, the existing catheter was removed over the wire. A peel-away sheath was then advanced over the wire and into the right heart. The new 19 cm palindrome hemodialysis catheter was then advanced through the peel-away sheath and the peel-away sheath was discarded. The catheter tip is in the right atrium. Images were obtained and stored for the medical record. The catheter flushes and aspirates easily. The catheter was flushed, locked with heparinized saline, capped and secured to the skin with 0 Prolene suture. Sterile bandages were applied. IMPRESSION: Successful exchange for a new 19 cm palindrome tunneled hemodialysis catheter. Catheter tips are in the right atrium and the device is ready for immediate use. Electronically Signed   By: Malachy Moan M.D.   On:  09/18/2022 12:46   IR Fluoro Guide CV Line Right  Result Date: 09/14/2022 CLINICAL DATA:  Dyspnea, chronic kidney disease EXAM: EXAM RIGHT IJ CATHETER PLACEMENT UNDER ULTRASOUND AND FLUOROSCOPIC GUIDANCE TECHNIQUE: The procedure, risks (including but not limited to bleeding, infection, organ damage, pneumothorax), benefits, and alternatives were explained to the patient. Questions regarding the procedure were encouraged and answered. The patient understands and consents to the procedure. Patency of the right IJ vein was confirmed with ultrasound with image documentation. An appropriate  skin site was determined. Skin site was marked. Region was prepped using maximum barrier technique including cap and mask, sterile gown, sterile gloves, large sterile sheet, and Chlorhexidine as cutaneous antisepsis. The region was infiltrated locally with 1% lidocaine. Under real-time ultrasound guidance, the right IJ vein was accessed with a 21 gauge needle; the needle tip within the vein was confirmed with ultrasound image documentation. The needle exchanged over a 018 guidewire for vascular dilator which allowed advancement of a 20 cm Mahurkar catheter. This was positioned with the tip at the cavoatrial junction. Spot chest radiograph shows good positioning and no pneumothorax. Catheter was flushed and sutured externally with 0-Prolene suture. Patient tolerated the procedure well. FLUOROSCOPY TIME:  Radiation Exposure Index (as provided by the fluoroscopic device): 1 mGy air Kerma COMPLICATIONS: COMPLICATIONS none IMPRESSION: 1. Technically successful right IJ Mahurkar catheter placement. Electronically Signed   By: Corlis Leak M.D.   On: 09/14/2022 14:58   IR US Guide Vasc Access Right  Result Date: 09/14/2022 CLINICAL DATA:  Dyspnea, chronic kidney disease EXAM: EXAM RIGHT IJ CATHETER PLACEMENT UNDER ULTRASOUND AND FLUOROSCOPIC GUIDANCE TECHNIQUE: The procedure, risks (including but not limited to bleeding, infection,  organ damage, pneumothorax), benefits, and alternatives were explained to the patient. Questions regarding the procedure were encouraged and answered. The patient understands and consents to the procedure. Patency of the right IJ vein was confirmed with ultrasound with image documentation. An appropriate skin site was determined. Skin site was marked. Region was prepped using maximum barrier technique including cap and mask, sterile gown, sterile gloves, large sterile sheet, and Chlorhexidine as cutaneous antisepsis. The region was infiltrated locally with 1% lidocaine. Under real-time ultrasound guidance, the right IJ vein was accessed with a 21 gauge needle; the needle tip within the vein was confirmed with ultrasound image documentation. The needle exchanged over a 018 guidewire for vascular dilator which allowed advancement of a 20 cm Mahurkar catheter. This was positioned with the tip at the cavoatrial junction. Spot chest radiograph shows good positioning and no pneumothorax. Catheter was flushed and sutured externally with 0-Prolene suture. Patient tolerated the procedure well. FLUOROSCOPY TIME:  Radiation Exposure Index (as provided by the fluoroscopic device): 1 mGy air Kerma COMPLICATIONS: COMPLICATIONS none IMPRESSION: 1. Technically successful right IJ Mahurkar catheter placement. Electronically Signed   By: Corlis Leak M.D.   On: 09/14/2022 14:58   DG Chest 2 View  Result Date: 09/11/2022 CLINICAL DATA:  Dyspnea. EXAM: CHEST - 2 VIEW COMPARISON:  September 06, 2022 FINDINGS: Normal cardiac silhouette. Calcific atherosclerotic disease and tortuosity of the aorta. Interstitial pulmonary edema.  Bilateral small pleural effusions. IMPRESSION: Interstitial pulmonary edema with bilateral small pleural effusions. Electronically Signed   By: Ted Mcalpine M.D.   On: 09/11/2022 19:54   US RENAL  Result Date: 09/07/2022 CLINICAL DATA:  Chronic kidney disease stage 3 Diabetes Hypertension EXAM: RENAL /  URINARY TRACT ULTRASOUND COMPLETE COMPARISON:  CT chest abdomen pelvis 07/20/2022 FINDINGS: Right Kidney: Renal measurements: 9.2 x 3.8 x 6.0 cm = volume: 109 mL. Echogenicity within normal limits. No mass or hydronephrosis visualized. 2.8 cm simple cyst in the upper pole does not require dedicated imaging follow-up. 1.5 cm hypoechoic structure in the lower pole of the right kidney does not appear anechoic. Left Kidney: Renal measurements: 10.8 x 6.0 x 5.3 cm = volume: 179 mL. Echogenicity within normal limits. No mass. Mild left hydronephrosis, similar to prior exam. 3.4 cm simple cyst in the lower pole does not require dedicated imaging follow-up. Bladder:  Evaluation limited due to under distension. Other: None. IMPRESSION: 1. Unchanged mild left hydronephrosis. 2. 1.5 cm hypoechoic structure in the lower pole of the right kidney can not be characterized as a simple cyst given its internal echoes. It is more likely to be a mildly complicated cyst with proteinaceous or hemorrhagic debris thin a neoplasm. Recommend follow-up ultrasound in 6 months to again evaluate this finding. Electronically Signed   By: Acquanetta Belling M.D.   On: 09/07/2022 11:52   MR ANGIO NECK W WO CONTRAST  Result Date: 09/06/2022 CLINICAL DATA:  Infarct seen on noncontrast MRI EXAM: MRA NECK WITHOUT AND WITH CONTRAST MRA HEAD WITHOUT CONTRAST TECHNIQUE: Multiplanar and multiecho pulse sequences of the neck were obtained without and with intravenous contrast. Angiographic images of the neck were obtained using MRA technique without and with intravenous contrast; Angiographic images of the Circle of Willis were obtained using MRA technique without intravenous contrast. CONTRAST:  7mL GADAVIST GADOBUTROL 1 MMOL/ML IV SOLN COMPARISON:  None Available. FINDINGS: MRA NECK FINDINGS Aortic arch: The imaged aortic arch is normal. The origins of the major branch vessels are patent. The subclavian arteries are patent to the level imaged. Right carotid  system: The right common, internal, and external carotid arteries are patent, with mild irregularity at the bulb likely reflecting atherosclerotic disease but no evidence of hemodynamically significant stenosis or occlusion. There is no evidence of dissection or aneurysm. Left carotid system: The left common, internal, and external carotid arteries are patent, with mild irregularity at the bulb likely reflecting atherosclerotic disease but no evidence of hemodynamically significant stenosis or occlusion there is no evidence of dissection or aneurysm Vertebral arteries: The vertebral artery origins are not well assessed. Otherwise, the vertebral arteries are patent with antegrade flow. There is no evidence of hemodynamically significant stenosis or occlusion there is no evidence of dissection or aneurysm. Other: None. MRA HEAD FINDINGS Image quality is degraded by motion artifact at the level of the cavernous ICAs. Anterior circulation: The intracranial ICAs are patent, without significant stenosis or occlusion. The MCAs are patent, without proximal high-grade stenosis or occlusion. The bilateral ACAS are patent, without proximal stenosis or occlusion. There is no definite aneurysm. Posterior circulation: The bilateral V4 segments are patent. The basilar artery is patent. The major cerebellar arteries appear patent. The bilateral PCAs are patent, without proximal high-grade stenosis or occlusion. A left posterior communicating artery is identified. There is no definite aneurysm. Anatomic variants: None. Other: None. IMPRESSION: Patent vasculature of the head and neck without evidence of hemodynamically significant stenosis, occlusion, or dissection. Electronically Signed   By: Lesia Hausen M.D.   On: 09/06/2022 17:14   MR ANGIO HEAD WO CONTRAST  Result Date: 09/06/2022 CLINICAL DATA:  Infarct seen on noncontrast MRI EXAM: MRA NECK WITHOUT AND WITH CONTRAST MRA HEAD WITHOUT CONTRAST TECHNIQUE: Multiplanar and  multiecho pulse sequences of the neck were obtained without and with intravenous contrast. Angiographic images of the neck were obtained using MRA technique without and with intravenous contrast; Angiographic images of the Circle of Willis were obtained using MRA technique without intravenous contrast. CONTRAST:  7mL GADAVIST GADOBUTROL 1 MMOL/ML IV SOLN COMPARISON:  None Available. FINDINGS: MRA NECK FINDINGS Aortic arch: The imaged aortic arch is normal. The origins of the major branch vessels are patent. The subclavian arteries are patent to the level imaged. Right carotid system: The right common, internal, and external carotid arteries are patent, with mild irregularity at the bulb likely reflecting atherosclerotic disease but no evidence  of hemodynamically significant stenosis or occlusion. There is no evidence of dissection or aneurysm. Left carotid system: The left common, internal, and external carotid arteries are patent, with mild irregularity at the bulb likely reflecting atherosclerotic disease but no evidence of hemodynamically significant stenosis or occlusion there is no evidence of dissection or aneurysm Vertebral arteries: The vertebral artery origins are not well assessed. Otherwise, the vertebral arteries are patent with antegrade flow. There is no evidence of hemodynamically significant stenosis or occlusion there is no evidence of dissection or aneurysm. Other: None. MRA HEAD FINDINGS Image quality is degraded by motion artifact at the level of the cavernous ICAs. Anterior circulation: The intracranial ICAs are patent, without significant stenosis or occlusion. The MCAs are patent, without proximal high-grade stenosis or occlusion. The bilateral ACAS are patent, without proximal stenosis or occlusion. There is no definite aneurysm. Posterior circulation: The bilateral V4 segments are patent. The basilar artery is patent. The major cerebellar arteries appear patent. The bilateral PCAs are  patent, without proximal high-grade stenosis or occlusion. A left posterior communicating artery is identified. There is no definite aneurysm. Anatomic variants: None. Other: None. IMPRESSION: Patent vasculature of the head and neck without evidence of hemodynamically significant stenosis, occlusion, or dissection. Electronically Signed   By: Lesia Hausen M.D.   On: 09/06/2022 17:14   MR BRAIN WO CONTRAST  Result Date: 09/06/2022 CLINICAL DATA:  Neuro deficit with acute stroke suspected EXAM: MRI HEAD WITHOUT CONTRAST TECHNIQUE: Multiplanar, multiecho pulse sequences of the brain and surrounding structures were obtained without intravenous contrast. 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 Small acute infarct in the postcentral gyrus on the right chronic small vessel ischemia to a moderate degree. No acute hemorrhage, hydrocephalus, or masslike finding Vascular: Normal flow voids. Skull and upper cervical spine: Normal marrow signal. Sinuses/Orbits: Negative. IMPRESSION: 1. 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 2. 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 Electronically Signed   By: Tiburcio Pea M.D.   On: 09/06/2022 11:40   DG Chest Portable 1 View  Result Date: 09/06/2022 CLINICAL DATA:  Pain EXAM: PORTABLE CHEST 1 VIEW COMPARISON:  08/07/2022 FINDINGS: Stable cardiomediastinal contours. Aortic atherosclerosis. Perihilar and bibasilar interstitial opacities. No pleural effusion or pneumothorax. IMPRESSION: Perihilar and bibasilar interstitial opacities, which may reflect edema or atypical infection. Electronically Signed   By: Duanne Guess D.O.   On: 09/06/2022 10:14      Subjective:   Discharge Exam: Vitals:   09/23/22 1957 09/24/22 0756   BP: 106/63 116/62  Pulse: 82 81  Resp: 19 18  Temp: 98.1 F (36.7 C) 97.8 F (36.6 C)  SpO2: 100% 95%   Vitals:   09/23/22 1547 09/23/22 1957 09/24/22 0500 09/24/22 0756  BP: (!) 99/57 106/63  116/62  Pulse: 81 82  81  Resp: 18 19  18   Temp: 98 F (36.7 C) 98.1 F (36.7 C)  97.8 F (36.6 C)  TempSrc: Oral Oral  Oral  SpO2: 97% 100%  95%  Weight:   77.3 kg   Height:        General: Pt is alert, awake, not in acute distress Cardiovascular: RRR, S1/S2 +, no rubs, no gallops Respiratory: CTA bilaterally, no wheezing, no rhonchi Abdominal: Soft, NT, ND, bowel sounds + Extremities: no edema, no cyanosis  The results of significant diagnostics from this hospitalization (including imaging, microbiology, ancillary and laboratory) are listed below for reference.     Microbiology: No results found for this or any previous visit (from the past 240 hour(s)).   Labs: BNP (last 3 results) Recent Labs    07/20/22 1030 08/03/22 1710  BNP 1,142.6* 1,094.9*   Basic Metabolic Panel: Recent Labs  Lab 09/19/22 1101 09/20/22 0425 09/21/22 0500 09/21/22 2341 09/23/22 1847  NA 134* 132* 131* 133* 132*  K 3.6 3.7 3.7 4.0 3.5  CL 98 100 99 95* 94*  CO2 27 26 27 30 29   GLUCOSE 166* 192* 209* 250* 121*  BUN 29* 32* 35* 19 11  CREATININE 2.12* 2.35* 2.90* 2.13* 1.78*  CALCIUM 7.7* 7.5* 7.8* 8.0* 8.0*  PHOS  --   --   --   --  2.3*   Liver Function Tests: Recent Labs  Lab 09/20/22 0425 09/23/22 1847  AST 26  --   ALT 9  --   ALKPHOS 74  --   BILITOT 0.5  --   PROT 5.1*  --   ALBUMIN <1.5* 1.6*   No results for input(s): "LIPASE", "AMYLASE" in the last 168 hours. No results for input(s): "AMMONIA" in the last 168 hours. CBC: Recent Labs  Lab 09/19/22 1101 09/20/22 0425 09/21/22 0500 09/21/22 2341 09/23/22 1847  WBC 12.2* 11.1* 11.5* 12.6* 11.4*  HGB 8.2* 8.1* 7.8* 8.2* 8.8*  HCT 26.6* 26.2* 25.2* 26.2* 28.3*  MCV 95.3 96.3 98.4 97.4 95.0  PLT 272 268 289  296 381   Cardiac Enzymes: No results for input(s): "CKTOTAL", "CKMB", "CKMBINDEX", "TROPONINI" in the last 168 hours. BNP: Invalid input(s): "POCBNP" CBG: Recent Labs  Lab 09/23/22 0734 09/23/22 1213 09/23/22 1609 09/23/22 2136 09/24/22 0608  GLUCAP 96 83 175* 103* 68*   D-Dimer No results for input(s): "DDIMER" in the last 72 hours. Hgb A1c No results for input(s): "HGBA1C" in the last 72 hours. Lipid Profile No results for input(s): "CHOL", "HDL", "LDLCALC", "TRIG", "CHOLHDL", "LDLDIRECT" in the last 72 hours. Thyroid function studies No results for input(s): "TSH", "T4TOTAL", "T3FREE", "THYROIDAB" in the last 72 hours.  Invalid input(s): "FREET3" Anemia work up No results for input(s): "VITAMINB12", "FOLATE", "FERRITIN", "TIBC", "IRON", "RETICCTPCT" in the last 72 hours. Urinalysis    Component Value Date/Time   COLORURINE YELLOW 09/06/2022 1140   APPEARANCEUR TURBID (A) 09/06/2022 1140   LABSPEC 1.011 09/06/2022 1140   PHURINE 5.0 09/06/2022 1140   GLUCOSEU >=500 (A) 09/06/2022 1140   GLUCOSEU NEGATIVE 05/06/2022 1103   HGBUR LARGE (A) 09/06/2022 1140   BILIRUBINUR NEGATIVE 09/06/2022 1140   BILIRUBINUR neg 05/16/2015 1928   KETONESUR NEGATIVE 09/06/2022 1140   PROTEINUR 100 (A) 09/06/2022 1140   UROBILINOGEN 0.2 05/06/2022 1103   NITRITE NEGATIVE 09/06/2022 1140   LEUKOCYTESUR LARGE (A) 09/06/2022 1140   Sepsis Labs Recent Labs  Lab 09/20/22 0425 09/21/22 0500 09/21/22 2341 09/23/22 1847  WBC 11.1* 11.5* 12.6* 11.4*   Microbiology No results found for this or any previous visit (from the past 240 hour(s)).  Please note: You were cared for by a hospitalist during your hospital stay. Once you are discharged, your primary care physician will handle any further medical issues. Please note that NO REFILLS for any discharge medications will be authorized once you are discharged, as it is imperative that you return to your primary care physician (or establish  a relationship with a primary care physician if you do not have one) for  your post hospital discharge needs so that they can reassess your need for medications and monitor your lab values.    Time coordinating discharge: 40 minutes  SIGNED:   Burnadette Pop, MD  [facility] 09/24/2022, 9:19 AM Pager [phone]  If 7PM-7AM, please contact night-coverage [email or web address] [facility] Physician Discharge Summary  [name] [name] UJW:[id number] DOB: Jun 05, 1940 DOA: 09/06/2022  PCP: [name], MD  Admit date: 09/06/2022 Discharge date: 09/24/2022  Admitted From: Home Disposition:  Home  Discharge Condition:Stable CODE STATUS:FULL, DNR, Comfort Care Diet recommendation: Heart Healthy / Carb Modified / Regular / Dysphagia   Brief/Interim Summary:   Following problems were addressed during the hospitalization:   Discharge Diagnoses:  Principal Problem:   Acute renal failure superimposed on stage 4 chronic kidney disease (HCC) Active Problems:   Acute CVA (cerebrovascular accident) (HCC)   ESBL Bacteremia due to UTI   Hyponatremia   Hyperlipidemia   Essential hypertension   Paroxysmal atrial fibrillation (HCC)   Seizure disorder (HCC)   Diabetes (HCC)   CAD S/P percutaneous coronary angioplasty   CKD (chronic kidney disease) stage 4, GFR 15-29 ml/min (HCC)   Anemia in chronic kidney disease (CKD)   Chronic systolic CHF (congestive heart failure) (HCC)   Hyperkalemia    Discharge Instructions  Discharge Instructions     Diet - low sodium heart healthy   Complete by: As directed    Discharge instructions   Complete by: As directed    1)Please take your medications as instructed   Increase activity slowly   Complete by: As directed    No wound care   Complete by: As directed       Allergies as of 09/24/2022       Reactions   Procaine Hcl Anaphylaxis   Amoxicillin Itching        Medication List     STOP taking these medications    furosemide  40 MG tablet Commonly known as: LASIX   metoprolol succinate 50 MG 24 hr tablet Commonly known as: TOPROL-XL   Nitroglycerin 0.4 % Oint       TAKE these medications    alendronate 35 MG tablet Commonly known as: FOSAMAX Take 35 mg by mouth every 7 (seven) days. Take with a full glass of water on an empty stomach.   amiodarone 200 MG tablet Commonly known as: PACERONE Take 1 tablet (200 mg total) by mouth daily.   apixaban 2.5 MG Tabs tablet Commonly known as: ELIQUIS Take 1 tablet (2.5 mg total) by mouth 2 (two) times daily. To start likely on Friday 6/14 if recommended by Coumadin clinic after labs done   atorvastatin 40 MG tablet Commonly known as: LIPITOR Take 1 tablet (40 mg total) by mouth daily. What changed:  medication strength how much to take   ezetimibe 10 MG tablet Commonly known as: ZETIA Take 1 tablet (10 mg total) by mouth daily.   hydrocortisone 2.5 % cream Apply topically 2 (two) times daily.   MULTIVITAMIN ADULT PO Take 1 tablet by mouth daily with breakfast.   NovoLIN 70/30 ReliOn (70-30) 100 UNIT/ML injection Generic drug: insulin NPH-regular Human 10 units in the morning and 5 units at night. What changed:  how much to take how to take this when to take this additional instructions   pantoprazole 40 MG tablet Commonly known as: PROTONIX Take 1 tablet (40 mg total) by mouth daily.   polyethylene glycol 17 g packet Commonly known as: MIRALAX / GLYCOLAX Take 17 g  by mouth daily.   VITAMIN D (CHOLECALCIFEROL) PO Take 1 tablet by mouth daily.         Contact information for follow-up providers     Micki Riley, MD. Go on 11/30/2022.   Specialties: Neurology, Radiology Why: stroke clinic Contact information: [address] [place] [address] [phone]         [facility] [place]. Go on 09/25/2022.   Why: Schedule is Monday,Wednesday, Friday with 12:15 chair time.  On Friday, please arrive at  11:45 am to complete paperwork prior to treatment. Contact information: [address] [place] [address] [phone]              Contact information for after-discharge care     Destination     [facility], [facility] Preferred SNF .   Service: Skilled Nursing Contact information: [address] [place] [address] [phone]                    Allergies  Allergen Reactions   Procaine Hcl Anaphylaxis   Amoxicillin Itching    Consultations:    Procedures/Studies: IR Fluoro Guide CV Line Right  Result Date: 09/18/2022 INDICATION: Conversion of temporary hemodialysis catheter to tunneled hemodialysis catheter. EXAM: IR RIGHT FLUORO GUIDE CV LINE MEDICATIONS: 2 g Ancef; The antibiotic was administered within an appropriate time interval prior to skin puncture. ANESTHESIA/SEDATION: Moderate (conscious) sedation was employed during this procedure. A total of Versed 1 mg and Fentanyl 75 mcg was administered intravenously. Moderate Sedation Time: 12 minutes. The patient's level of consciousness and vital signs were monitored continuously by radiology nursing throughout the procedure under my direct supervision. FLUOROSCOPY TIME:  Radiation exposure index: 1 mGy reference air kerma COMPLICATIONS: None immediate. PROCEDURE: Informed written consent was obtained from the patient after a thorough discussion of the procedural risks, benefits and alternatives. All questions were addressed. Maximal Sterile Barrier Technique was utilized including caps, mask, sterile gowns, sterile gloves, sterile drape, hand hygiene and skin antiseptic. A timeout was performed prior to the initiation of the procedure. The existing right IJ temporary hemodialysis catheter was completely prepped and draped. A 0.035 wire was advanced through the central line lumen of the tunneled catheter, through the right atrium and into the inferior vena cava. Local anesthesia at  the catheter insertion site was attained by infiltration with 1% lidocaine. Additionally, a suitable skin exit site inferior to the clavicle was anesthetized with 1% lidocaine. A small dermatotomy was made. Next, a 19 cm palindrome hemodialysis catheter was tunneled from the skin exit site to the dermatotomy where the existing temporary catheter enters the vein. Next, the existing catheter was removed over the wire. A peel-away sheath was then advanced over the wire and into the right heart. The new 19 cm palindrome hemodialysis catheter was then advanced through the peel-away sheath and the peel-away sheath was discarded. The catheter tip is in the right atrium. Images were obtained and stored for the medical record. The catheter flushes and aspirates easily. The catheter was flushed, locked with heparinized saline, capped and secured to the skin with 0 Prolene suture. Sterile bandages were applied. IMPRESSION: Successful exchange for a new 19 cm palindrome tunneled hemodialysis catheter. Catheter tips are in the right atrium and the device is ready for immediate use. Electronically Signed   By: Malachy Moan M.D.   On: 09/18/2022 12:46   IR Fluoro Guide CV Line Right  Result Date: 09/14/2022 CLINICAL DATA:  Dyspnea, chronic  kidney disease EXAM: EXAM RIGHT IJ CATHETER PLACEMENT UNDER ULTRASOUND AND FLUOROSCOPIC GUIDANCE TECHNIQUE: The procedure, risks (including but not limited to bleeding, infection, organ damage, pneumothorax), benefits, and alternatives were explained to the patient. Questions regarding the procedure were encouraged and answered. The patient understands and consents to the procedure. Patency of the right IJ vein was confirmed with ultrasound with image documentation. An appropriate skin site was determined. Skin site was marked. Region was prepped using maximum barrier technique including cap and mask, sterile gown, sterile gloves, large sterile sheet, and Chlorhexidine as cutaneous  antisepsis. The region was infiltrated locally with 1% lidocaine. Under real-time ultrasound guidance, the right IJ vein was accessed with a 21 gauge needle; the needle tip within the vein was confirmed with ultrasound image documentation. The needle exchanged over a 018 guidewire for vascular dilator which allowed advancement of a 20 cm Mahurkar catheter. This was positioned with the tip at the cavoatrial junction. Spot chest radiograph shows good positioning and no pneumothorax. Catheter was flushed and sutured externally with 0-Prolene suture. Patient tolerated the procedure well. FLUOROSCOPY TIME:  Radiation Exposure Index (as provided by the fluoroscopic device): 1 mGy air Kerma COMPLICATIONS: COMPLICATIONS none IMPRESSION: 1. Technically successful right IJ Mahurkar catheter placement. Electronically Signed   By: Corlis Leak M.D.   On: 09/14/2022 14:58   IR US Guide Vasc Access Right  Result Date: 09/14/2022 CLINICAL DATA:  Dyspnea, chronic kidney disease EXAM: EXAM RIGHT IJ CATHETER PLACEMENT UNDER ULTRASOUND AND FLUOROSCOPIC GUIDANCE TECHNIQUE: The procedure, risks (including but not limited to bleeding, infection, organ damage, pneumothorax), benefits, and alternatives were explained to the patient. Questions regarding the procedure were encouraged and answered. The patient understands and consents to the procedure. Patency of the right IJ vein was confirmed with ultrasound with image documentation. An appropriate skin site was determined. Skin site was marked. Region was prepped using maximum barrier technique including cap and mask, sterile gown, sterile gloves, large sterile sheet, and Chlorhexidine as cutaneous antisepsis. The region was infiltrated locally with 1% lidocaine. Under real-time ultrasound guidance, the right IJ vein was accessed with a 21 gauge needle; the needle tip within the vein was confirmed with ultrasound image documentation. The needle exchanged over a 018 guidewire for vascular  dilator which allowed advancement of a 20 cm Mahurkar catheter. This was positioned with the tip at the cavoatrial junction. Spot chest radiograph shows good positioning and no pneumothorax. Catheter was flushed and sutured externally with 0-Prolene suture. Patient tolerated the procedure well. FLUOROSCOPY TIME:  Radiation Exposure Index (as provided by the fluoroscopic device): 1 mGy air Kerma COMPLICATIONS: COMPLICATIONS none IMPRESSION: 1. Technically successful right IJ Mahurkar catheter placement. Electronically Signed   By: Corlis Leak M.D.   On: 09/14/2022 14:58   DG Chest 2 View  Result Date: 09/11/2022 CLINICAL DATA:  Dyspnea. EXAM: CHEST - 2 VIEW COMPARISON:  September 06, 2022 FINDINGS: Normal cardiac silhouette. Calcific atherosclerotic disease and tortuosity of the aorta. Interstitial pulmonary edema.  Bilateral small pleural effusions. IMPRESSION: Interstitial pulmonary edema with bilateral small pleural effusions. Electronically Signed   By: Ted Mcalpine M.D.   On: 09/11/2022 19:54   US RENAL  Result Date: 09/07/2022 CLINICAL DATA:  Chronic kidney disease stage 3 Diabetes Hypertension EXAM: RENAL / URINARY TRACT ULTRASOUND COMPLETE COMPARISON:  CT chest abdomen pelvis 07/20/2022 FINDINGS: Right Kidney: Renal measurements: 9.2 x 3.8 x 6.0 cm = volume: 109 mL. Echogenicity within normal limits. No mass or hydronephrosis visualized. 2.8 cm simple cyst in the  upper pole does not require dedicated imaging follow-up. 1.5 cm hypoechoic structure in the lower pole of the right kidney does not appear anechoic. Left Kidney: Renal measurements: 10.8 x 6.0 x 5.3 cm = volume: 179 mL. Echogenicity within normal limits. No mass. Mild left hydronephrosis, similar to prior exam. 3.4 cm simple cyst in the lower pole does not require dedicated imaging follow-up. Bladder: Evaluation limited due to under distension. Other: None. IMPRESSION: 1. Unchanged mild left hydronephrosis. 2. 1.5 cm hypoechoic structure in  the lower pole of the right kidney can not be characterized as a simple cyst given its internal echoes. It is more likely to be a mildly complicated cyst with proteinaceous or hemorrhagic debris thin a neoplasm. Recommend follow-up ultrasound in 6 months to again evaluate this finding. Electronically Signed   By: Acquanetta Belling M.D.   On: 09/07/2022 11:52   MR ANGIO NECK W WO CONTRAST  Result Date: 09/06/2022 CLINICAL DATA:  Infarct seen on noncontrast MRI EXAM: MRA NECK WITHOUT AND WITH CONTRAST MRA HEAD WITHOUT CONTRAST TECHNIQUE: Multiplanar and multiecho pulse sequences of the neck were obtained without and with intravenous contrast. Angiographic images of the neck were obtained using MRA technique without and with intravenous contrast; Angiographic images of the Circle of Willis were obtained using MRA technique without intravenous contrast. CONTRAST:  7mL GADAVIST GADOBUTROL 1 MMOL/ML IV SOLN COMPARISON:  None Available. FINDINGS: MRA NECK FINDINGS Aortic arch: The imaged aortic arch is normal. The origins of the major branch vessels are patent. The subclavian arteries are patent to the level imaged. Right carotid system: The right common, internal, and external carotid arteries are patent, with mild irregularity at the bulb likely reflecting atherosclerotic disease but no evidence of hemodynamically significant stenosis or occlusion. There is no evidence of dissection or aneurysm. Left carotid system: The left common, internal, and external carotid arteries are patent, with mild irregularity at the bulb likely reflecting atherosclerotic disease but no evidence of hemodynamically significant stenosis or occlusion there is no evidence of dissection or aneurysm Vertebral arteries: The vertebral artery origins are not well assessed. Otherwise, the vertebral arteries are patent with antegrade flow. There is no evidence of hemodynamically significant stenosis or occlusion there is no evidence of dissection or  aneurysm. Other: None. MRA HEAD FINDINGS Image quality is degraded by motion artifact at the level of the cavernous ICAs. Anterior circulation: The intracranial ICAs are patent, without significant stenosis or occlusion. The MCAs are patent, without proximal high-grade stenosis or occlusion. The bilateral ACAS are patent, without proximal stenosis or occlusion. There is no definite aneurysm. Posterior circulation: The bilateral V4 segments are patent. The basilar artery is patent. The major cerebellar arteries appear patent. The bilateral PCAs are patent, without proximal high-grade stenosis or occlusion. A left posterior communicating artery is identified. There is no definite aneurysm. Anatomic variants: None. Other: None. IMPRESSION: Patent vasculature of the head and neck without evidence of hemodynamically significant stenosis, occlusion, or dissection. Electronically Signed   By: Lesia Hausen M.D.   On: 09/06/2022 17:14   MR ANGIO HEAD WO CONTRAST  Result Date: 09/06/2022 CLINICAL DATA:  Infarct seen on noncontrast MRI EXAM: MRA NECK WITHOUT AND WITH CONTRAST MRA HEAD WITHOUT CONTRAST TECHNIQUE: Multiplanar and multiecho pulse sequences of the neck were obtained without and with intravenous contrast. Angiographic images of the neck were obtained using MRA technique without and with intravenous contrast; Angiographic images of the Circle of Willis were obtained using MRA technique without intravenous contrast. CONTRAST:  7mL  GADAVIST GADOBUTROL 1 MMOL/ML IV SOLN COMPARISON:  None Available. FINDINGS: MRA NECK FINDINGS Aortic arch: The imaged aortic arch is normal. The origins of the major branch vessels are patent. The subclavian arteries are patent to the level imaged. Right carotid system: The right common, internal, and external carotid arteries are patent, with mild irregularity at the bulb likely reflecting atherosclerotic disease but no evidence of hemodynamically significant stenosis or occlusion.  There is no evidence of dissection or aneurysm. Left carotid system: The left common, internal, and external carotid arteries are patent, with mild irregularity at the bulb likely reflecting atherosclerotic disease but no evidence of hemodynamically significant stenosis or occlusion there is no evidence of dissection or aneurysm Vertebral arteries: The vertebral artery origins are not well assessed. Otherwise, the vertebral arteries are patent with antegrade flow. There is no evidence of hemodynamically significant stenosis or occlusion there is no evidence of dissection or aneurysm. Other: None. MRA HEAD FINDINGS Image quality is degraded by motion artifact at the level of the cavernous ICAs. Anterior circulation: The intracranial ICAs are patent, without significant stenosis or occlusion. The MCAs are patent, without proximal high-grade stenosis or occlusion. The bilateral ACAS are patent, without proximal stenosis or occlusion. There is no definite aneurysm. Posterior circulation: The bilateral V4 segments are patent. The basilar artery is patent. The major cerebellar arteries appear patent. The bilateral PCAs are patent, without proximal high-grade stenosis or occlusion. A left posterior communicating artery is identified. There is no definite aneurysm. Anatomic variants: None. Other: None. IMPRESSION: Patent vasculature of the head and neck without evidence of hemodynamically significant stenosis, occlusion, or dissection. Electronically Signed   By: Lesia Hausen M.D.   On: 09/06/2022 17:14   MR BRAIN WO CONTRAST  Result Date: 09/06/2022 CLINICAL DATA:  Neuro deficit with acute stroke suspected EXAM: MRI HEAD WITHOUT CONTRAST TECHNIQUE: Multiplanar, multiecho pulse sequences of the brain and surrounding structures were obtained without intravenous contrast. 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 Small acute infarct in the postcentral gyrus on the right chronic small vessel ischemia to a moderate degree. No acute hemorrhage, hydrocephalus, or masslike finding Vascular: Normal flow voids. Skull and upper cervical spine: Normal marrow signal. Sinuses/Orbits: Negative. IMPRESSION: 1. 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 2. 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 Electronically Signed   By: Tiburcio Pea M.D.   On: 09/06/2022 11:40   DG Chest Portable 1 View  Result Date: 09/06/2022 CLINICAL DATA:  Pain EXAM: PORTABLE CHEST 1 VIEW COMPARISON:  08/07/2022 FINDINGS: Stable cardiomediastinal contours. Aortic atherosclerosis. Perihilar and bibasilar interstitial opacities. No pleural effusion or pneumothorax. IMPRESSION: Perihilar and bibasilar interstitial opacities, which may reflect edema or atypical infection. Electronically Signed   By: Duanne Guess D.O.   On: 09/06/2022 10:14      Subjective:   Discharge Exam: Vitals:   09/23/22 1957 09/24/22 0756  BP: 106/63 116/62  Pulse: 82 81  Resp: 19 18  Temp: 98.1 F (36.7 C) 97.8 F (36.6 C)  SpO2: 100% 95%   Vitals:   09/23/22 1547 09/23/22 1957 09/24/22 0500 09/24/22 0756  BP: (!) 99/57 106/63  116/62  Pulse: 81 82  81  Resp: 18 19  18   Temp: 98 F (36.7 C) 98.1 F (36.7 C)  97.8 F (36.6 C)  TempSrc: Oral Oral  Oral  SpO2: 97% 100%  95%  Weight:   77.3 kg   Height:        General: Pt is alert, awake, not in acute distress Cardiovascular: RRR, S1/S2 +, no rubs, no gallops Respiratory: CTA bilaterally, no wheezing, no rhonchi Abdominal: Soft, NT, ND, bowel sounds + Extremities: no edema, no cyanosis    The results of significant diagnostics from this hospitalization (including imaging, microbiology, ancillary and laboratory) are listed below for reference.      Microbiology: No results found for this or any previous visit (from the past 240 hour(s)).   Labs: BNP (last 3 results) Recent Labs    07/20/22 1030 08/03/22 1710  BNP 1,142.6* 1,094.9*   Basic Metabolic Panel: Recent Labs  Lab 09/19/22 1101 09/20/22 0425 09/21/22 0500 09/21/22 2341 09/23/22 1847  NA 134* 132* 131* 133* 132*  K 3.6 3.7 3.7 4.0 3.5  CL 98 100 99 95* 94*  CO2 27 26 27 30 29   GLUCOSE 166* 192* 209* 250* 121*  BUN 29* 32* 35* 19 11  CREATININE 2.12* 2.35* 2.90* 2.13* 1.78*  CALCIUM 7.7* 7.5* 7.8* 8.0* 8.0*  PHOS  --   --   --   --  2.3*   Liver Function Tests: Recent Labs  Lab 09/20/22 0425 09/23/22 1847  AST 26  --   ALT 9  --   ALKPHOS 74  --   BILITOT 0.5  --   PROT 5.1*  --   ALBUMIN <1.5* 1.6*   No results for input(s): "LIPASE", "AMYLASE" in the last 168 hours. No results for input(s): "AMMONIA" in the last 168 hours. CBC: Recent Labs  Lab 09/19/22 1101 09/20/22 0425 09/21/22 0500 09/21/22 2341 09/23/22 1847  WBC 12.2* 11.1* 11.5* 12.6* 11.4*  HGB 8.2* 8.1* 7.8* 8.2* 8.8*  HCT 26.6* 26.2* 25.2* 26.2* 28.3*  MCV 95.3 96.3 98.4 97.4 95.0  PLT 272 268 289 296 381   Cardiac Enzymes: No results for input(s): "CKTOTAL", "CKMB", "CKMBINDEX", "TROPONINI" in the last 168 hours. BNP: Invalid input(s): "POCBNP" CBG: Recent Labs  Lab 09/23/22 0734 09/23/22 1213 09/23/22 1609 09/23/22 2136 09/24/22 0608  GLUCAP 96 83 175* 103* 68*   D-Dimer No results for input(s): "DDIMER" in the last 72 hours. Hgb A1c No results for input(s): "HGBA1C" in the last 72 hours. Lipid Profile No results for input(s): "CHOL", "HDL", "LDLCALC", "TRIG", "CHOLHDL", "LDLDIRECT" in the last 72 hours. Thyroid function studies No results for input(s): "TSH", "T4TOTAL", "T3FREE", "THYROIDAB" in the last 72 hours.  Invalid input(s): "FREET3" Anemia work up No results for input(s): "VITAMINB12", "FOLATE", "FERRITIN", "TIBC", "IRON", "RETICCTPCT" in the  last 72 hours. Urinalysis    Component Value Date/Time   COLORURINE YELLOW 09/06/2022 1140   APPEARANCEUR TURBID (A) 09/06/2022 1140   LABSPEC 1.011 09/06/2022 1140   PHURINE 5.0 09/06/2022 1140   GLUCOSEU >=500 (A) 09/06/2022 1140   GLUCOSEU NEGATIVE 05/06/2022 1103   HGBUR LARGE (A) 09/06/2022 1140   BILIRUBINUR NEGATIVE 09/06/2022 1140   BILIRUBINUR neg 05/16/2015 1928   KETONESUR NEGATIVE 09/06/2022 1140   PROTEINUR 100 (A) 09/06/2022 1140   UROBILINOGEN 0.2 05/06/2022 1103   NITRITE NEGATIVE 09/06/2022 1140   LEUKOCYTESUR LARGE (A) 09/06/2022 1140   Sepsis Labs Recent Labs  Lab 09/20/22 0425 09/21/22 0500 09/21/22 2341 09/23/22 1847  WBC 11.1* 11.5* 12.6* 11.4*   Microbiology No results found for this or any previous visit (from the past 240 hour(s)).  Please note: You were cared  for by a hospitalist during your hospital stay. Once you are discharged, your primary care physician will handle any further medical issues. Please note that NO REFILLS for any discharge medications will be authorized once you are discharged, as it is imperative that you return to your primary care physician (or establish a relationship with a primary care physician if you do not have one) for your post hospital discharge needs so that they can reassess your need for medications and monitor your lab values.    Time coordinating discharge: 40 minutes  SIGNED:   Burnadette Pop, MD  [facility] 09/24/2022, 9:19 AM Pager [phone]  If 7PM-7AM, please contact night-coverage [email or web address] Password [email or web address]

## 2022-09-23 NOTE — Progress Notes (Signed)
Advised by CSW that pt should d/c to snf tomorrow. Contacted [facility] to advise clinic that pt should d/c tomorrow and will need to start on Friday. Pt will need to arrive at 11:45 on Friday to complete paperwork prior to treatment. Arrangements added to AVS. Contacted renal PA to request that orders be sent to clinic. Will assist as needed.   Olivia Canter Renal Navigator [phone]

## 2022-09-23 NOTE — Procedures (Signed)
I was present at this dialysis session. I have reviewed the session itself and made appropriate changes.   Filed Weights   09/21/22 0749 09/22/22 0500 09/23/22 0745  Weight: 81.4 kg 81.1 kg 79 kg    Recent Labs  Lab 09/21/22 2341  NA 133*  K 4.0  CL 95*  CO2 30  GLUCOSE 250*  BUN 19  CREATININE 2.13*  CALCIUM 8.0*    Recent Labs  Lab 09/20/22 0425 09/21/22 0500 09/21/22 2341  WBC 11.1* 11.5* 12.6*  HGB 8.1* 7.8* 8.2*  HCT 26.2* 25.2* 26.2*  MCV 96.3 98.4 97.4  PLT 268 289 296    Scheduled Meds:  amiodarone  200 mg Oral Daily   apixaban  2.5 mg Oral BID   atorvastatin  40 mg Oral Daily   Chlorhexidine Gluconate Cloth  6 each Topical Q0600   [START ON 09/26/2022] darbepoetin (ARANESP) injection - NON-DIALYSIS  150 mcg Subcutaneous Q Sat-1800   ezetimibe  10 mg Oral Daily   feeding supplement (NEPRO CARB STEADY)  237 mL Oral Q24H   heparin sodium (porcine)  10 mL Intracatheter Once   hydrocortisone cream   Topical TID   insulin aspart  0-6 Units Subcutaneous TID WC   insulin glargine-yfgn  3 Units Subcutaneous QHS   pantoprazole  40 mg Oral Daily   polyethylene glycol  17 g Oral Daily   witch hazel-glycerin   Topical TID   Continuous Infusions: PRN Meds:.acetaminophen, mouth rinse, oxyCODONE, senna-docusate   Louie Bun,  MD 09/23/2022, 11:41 AM

## 2022-09-23 NOTE — Progress Notes (Signed)
Pt refused 0400 vital signs and labs says she will get it done during the day. Wants to sleep undisturbed.

## 2022-09-24 LAB — GLUCOSE, CAPILLARY
Glucose-Capillary: 68 mg/dL — ABNORMAL LOW (ref 70–99)
Glucose-Capillary: 188 mg/dL — ABNORMAL HIGH (ref 70–99)

## 2022-09-24 NOTE — Consult Note (Signed)
   [facility] CM Inpatient Consult   09/24/2022  [name] [name] January 20, 1941 [id number]  Follow up note:    [facility] [[facility]]  Accountable Care Organization [ACO] Patient: Medicare ACO REACH    Primary Care Provider:  [name], MD   Patient was reviewed for 18 day length of stay and follow up for active team member for barriers to care  Patient was screened for hospitalization and on behalf of [facility] Care Coordination to assess for post hospital community care needs.  Patient went to a [facility] affiliated facility then, patient can be followed by [facility] RN with traditional Medicare and approved Medicare Advantage plans.   Plan:   Will notify the Community RN CC and [facility] RN can follow for any known or needs for transitional care needs for returning to post facility care coordination needs to return to community.  For questions or referrals, please contact:   Charlesetta Shanks, RN BSN CCM [facility]  [phone] business mobile phone Toll free office [phone]  *Concierge Line  [phone] Fax number: [phone] [email or web address]

## 2022-09-24 NOTE — Plan of Care (Signed)
Problem: Education: Goal: Ability to describe self-care measures that may prevent or decrease complications (Diabetes Survival Skills Education) will improve 09/24/2022 0348 by Clint Lipps, RN Outcome: Progressing 09/24/2022 0347 by Clint Lipps, RN Outcome: Progressing Goal: Individualized Educational Video(s) 09/24/2022 0348 by Clint Lipps, RN Outcome: Progressing 09/24/2022 0347 by Clint Lipps, RN Outcome: Progressing   Problem: Coping: Goal: Ability to adjust to condition or change in health will improve 09/24/2022 0348 by Clint Lipps, RN Outcome: Progressing 09/24/2022 0347 by Clint Lipps, RN Outcome: Progressing   Problem: Fluid Volume: Goal: Ability to maintain a balanced intake and output will improve 09/24/2022 0348 by Clint Lipps, RN Outcome: Progressing 09/24/2022 0347 by Clint Lipps, RN Outcome: Progressing   Problem: Health Behavior/Discharge Planning: Goal: Ability to identify and utilize available resources and services will improve 09/24/2022 0348 by Clint Lipps, RN Outcome: Progressing 09/24/2022 0347 by Clint Lipps, RN Outcome: Progressing Goal: Ability to manage health-related needs will improve 09/24/2022 0348 by Clint Lipps, RN Outcome: Progressing 09/24/2022 0347 by Clint Lipps, RN Outcome: Progressing   Problem: Metabolic: Goal: Ability to maintain appropriate glucose levels will improve 09/24/2022 0348 by Clint Lipps, RN Outcome: Progressing 09/24/2022 0347 by Clint Lipps, RN Outcome: Progressing   Problem: Nutritional: Goal: Maintenance of adequate nutrition will improve 09/24/2022 0348 by Clint Lipps, RN Outcome: Progressing 09/24/2022 0347 by Clint Lipps, RN Outcome: Progressing Goal: Progress toward achieving an optimal weight will improve 09/24/2022 0348 by Clint Lipps, RN Outcome: Progressing 09/24/2022 0347 by Clint Lipps, RN Outcome:  Progressing   Problem: Skin Integrity: Goal: Risk for impaired skin integrity will decrease 09/24/2022 0348 by Clint Lipps, RN Outcome: Progressing 09/24/2022 0347 by Clint Lipps, RN Outcome: Progressing   Problem: Tissue Perfusion: Goal: Adequacy of tissue perfusion will improve 09/24/2022 0348 by Clint Lipps, RN Outcome: Progressing 09/24/2022 0347 by Clint Lipps, RN Outcome: Progressing   Problem: Education: Goal: Knowledge of disease or condition will improve 09/24/2022 0348 by Clint Lipps, RN Outcome: Progressing 09/24/2022 0347 by Clint Lipps, RN Outcome: Progressing Goal: Knowledge of secondary prevention will improve (MUST DOCUMENT ALL) 09/24/2022 0348 by Clint Lipps, RN Outcome: Progressing 09/24/2022 0347 by Clint Lipps, RN Outcome: Progressing Goal: Knowledge of patient specific risk factors will improve Loraine Leriche N/A or DELETE if not current risk factor) 09/24/2022 0348 by Clint Lipps, RN Outcome: Progressing 09/24/2022 0347 by Clint Lipps, RN Outcome: Progressing   Problem: Ischemic Stroke/TIA Tissue Perfusion: Goal: Complications of ischemic stroke/TIA will be minimized 09/24/2022 0348 by Clint Lipps, RN Outcome: Progressing 09/24/2022 0347 by Clint Lipps, RN Outcome: Progressing   Problem: Coping: Goal: Will verbalize positive feelings about self 09/24/2022 0348 by Clint Lipps, RN Outcome: Progressing 09/24/2022 0347 by Clint Lipps, RN Outcome: Progressing Goal: Will identify appropriate support needs 09/24/2022 0348 by Clint Lipps, RN Outcome: Progressing 09/24/2022 0347 by Clint Lipps, RN Outcome: Progressing   Problem: Health Behavior/Discharge Planning: Goal: Ability to manage health-related needs will improve 09/24/2022 0348 by Clint Lipps, RN Outcome: Progressing 09/24/2022 0347 by Clint Lipps, RN Outcome: Progressing Goal: Goals will be collaboratively  established with patient/family 09/24/2022 0348 by Clint Lipps, RN Outcome: Progressing 09/24/2022 0347 by Clint Lipps, RN Outcome: Progressing   Problem: Self-Care: Goal: Ability to participate in self-care as condition permits will improve 09/24/2022 0348 by Clint Lipps, RN  Outcome: Progressing 09/24/2022 0347 by Clint Lipps, RN Outcome: Progressing Goal: Verbalization of feelings and concerns over difficulty with self-care will improve 09/24/2022 0348 by Clint Lipps, RN Outcome: Progressing 09/24/2022 0347 by Clint Lipps, RN Outcome: Progressing Goal: Ability to communicate needs accurately will improve 09/24/2022 0348 by Clint Lipps, RN Outcome: Progressing 09/24/2022 0347 by Clint Lipps, RN Outcome: Progressing   Problem: Nutrition: Goal: Risk of aspiration will decrease 09/24/2022 0348 by Clint Lipps, RN Outcome: Progressing 09/24/2022 0347 by Clint Lipps, RN Outcome: Progressing Goal: Dietary intake will improve 09/24/2022 0348 by Clint Lipps, RN Outcome: Progressing 09/24/2022 0347 by Clint Lipps, RN Outcome: Progressing   Problem: Education: Goal: Knowledge of General Education information will improve Description: Including pain rating scale, medication(s)/side effects and non-pharmacologic comfort measures 09/24/2022 0348 by Clint Lipps, RN Outcome: Progressing 09/24/2022 0347 by Clint Lipps, RN Outcome: Progressing   Problem: Health Behavior/Discharge Planning: Goal: Ability to manage health-related needs will improve 09/24/2022 0348 by Clint Lipps, RN Outcome: Progressing 09/24/2022 0347 by Clint Lipps, RN Outcome: Progressing   Problem: Clinical Measurements: Goal: Ability to maintain clinical measurements within normal limits will improve 09/24/2022 0348 by Clint Lipps, RN Outcome: Progressing 09/24/2022 0347 by Clint Lipps, RN Outcome: Progressing Goal:  Will remain free from infection 09/24/2022 0348 by Clint Lipps, RN Outcome: Progressing 09/24/2022 0347 by Clint Lipps, RN Outcome: Progressing Goal: Diagnostic test results will improve 09/24/2022 0348 by Clint Lipps, RN Outcome: Progressing 09/24/2022 0347 by Clint Lipps, RN Outcome: Progressing Goal: Respiratory complications will improve 09/24/2022 0348 by Clint Lipps, RN Outcome: Progressing 09/24/2022 0347 by Clint Lipps, RN Outcome: Progressing Goal: Cardiovascular complication will be avoided 09/24/2022 0348 by Clint Lipps, RN Outcome: Progressing 09/24/2022 0347 by Clint Lipps, RN Outcome: Progressing   Problem: Activity: Goal: Risk for activity intolerance will decrease 09/24/2022 0348 by Clint Lipps, RN Outcome: Progressing 09/24/2022 0347 by Clint Lipps, RN Outcome: Progressing   Problem: Nutrition: Goal: Adequate nutrition will be maintained 09/24/2022 0348 by Clint Lipps, RN Outcome: Progressing 09/24/2022 0347 by Clint Lipps, RN Outcome: Progressing   Problem: Coping: Goal: Level of anxiety will decrease 09/24/2022 0348 by Clint Lipps, RN Outcome: Progressing 09/24/2022 0347 by Clint Lipps, RN Outcome: Progressing   Problem: Elimination: Goal: Will not experience complications related to bowel motility 09/24/2022 0348 by Clint Lipps, RN Outcome: Progressing 09/24/2022 0347 by Clint Lipps, RN Outcome: Progressing Goal: Will not experience complications related to urinary retention 09/24/2022 0348 by Clint Lipps, RN Outcome: Progressing 09/24/2022 0347 by Clint Lipps, RN Outcome: Progressing   Problem: Pain Managment: Goal: General experience of comfort will improve 09/24/2022 0348 by Clint Lipps, RN Outcome: Progressing 09/24/2022 0347 by Clint Lipps, RN Outcome: Progressing   Problem: Safety: Goal: Ability to remain free from injury will  improve 09/24/2022 0348 by Clint Lipps, RN Outcome: Progressing 09/24/2022 0347 by Clint Lipps, RN Outcome: Progressing   Problem: Skin Integrity: Goal: Risk for impaired skin integrity will decrease 09/24/2022 0348 by Clint Lipps, RN Outcome: Progressing 09/24/2022 0347 by Clint Lipps, RN Outcome: Progressing

## 2022-09-24 NOTE — Progress Notes (Addendum)
Patient seen and examined at the bedside this morning.  She remains comfortable, hemodynamically stable.  No new change in the medical management.  She has a place at the SNF today.  Discharge orders and summary already in

## 2022-09-24 NOTE — Progress Notes (Signed)
D/C order noted. Spoke to pt via phone to discuss out-pt HD arrangements and that pt will need to start at clinic tomorrow. Arrangements added to AVS as well. Contacted renal PA regarding clinic's need for orders. Clinic advised pt will d/c today and start tomorrow as planned.   Olivia Canter Renal Navigator [phone]

## 2022-09-24 NOTE — Progress Notes (Addendum)
[facility] Progress Note   Subjective:    No complaints HD session with no issues yesterday  Objective Vitals:   09/23/22 1547 09/23/22 1957 09/24/22 0500 09/24/22 0756  BP: (!) 99/57 106/63  116/62  Pulse: 81 82  81  Resp: 18 19  18   Temp: 98 F (36.7 C) 98.1 F (36.7 C)  97.8 F (36.6 C)  TempSrc: Oral Oral  Oral  SpO2: 97% 100%  95%  Weight:   77.3 kg   Height:       Physical Exam BJY:NWGNF in bed, nad ENT: no nasal discharge, mmm EYES: no scleral icterus, eomi CV: normal rate PULM: no iwob, bilateral chest rise ABD: NABS, non-distended SKIN: no rashes or jaundice EXT: 1+ edema in ble, warm and well perfused R internal jugular TDC bandaged  Additional Objective Labs: Basic Metabolic Panel: Recent Labs  Lab 09/21/22 0500 09/21/22 2341 09/23/22 1847  NA 131* 133* 132*  K 3.7 4.0 3.5  CL 99 95* 94*  CO2 27 30 29   GLUCOSE 209* 250* 121*  BUN 35* 19 11  CREATININE 2.90* 2.13* 1.78*  CALCIUM 7.8* 8.0* 8.0*  PHOS  --   --  2.3*   Liver Function Tests: Recent Labs  Lab 09/20/22 0425 09/23/22 1847  AST 26  --   ALT 9  --   ALKPHOS 74  --   BILITOT 0.5  --   PROT 5.1*  --   ALBUMIN <1.5* 1.6*   No results for input(s): "LIPASE", "AMYLASE" in the last 168 hours. CBC: Recent Labs  Lab 09/19/22 1101 09/20/22 0425 09/21/22 0500 09/21/22 2341 09/23/22 1847  WBC 12.2* 11.1* 11.5* 12.6* 11.4*  HGB 8.2* 8.1* 7.8* 8.2* 8.8*  HCT 26.6* 26.2* 25.2* 26.2* 28.3*  MCV 95.3 96.3 98.4 97.4 95.0  PLT 272 268 289 296 381   Blood Culture    Component Value Date/Time   SDES BLOOD RIGHT HAND 09/12/2022 1434   SDES BLOOD LEFT HAND 09/12/2022 1434   SPECREQUEST  09/12/2022 1434    BOTTLES DRAWN AEROBIC AND ANAEROBIC Blood Culture adequate volume   SPECREQUEST  09/12/2022 1434    BOTTLES DRAWN AEROBIC AND ANAEROBIC Blood Culture results may not be optimal due to an excessive volume of blood received in culture bottles   CULT  09/12/2022 1434     NO GROWTH 5 DAYS Performed at [facility], [address], [place], [place] [address]    CULT (A) 09/12/2022 1434    STAPHYLOCOCCUS EPIDERMIDIS THE SIGNIFICANCE OF ISOLATING THIS ORGANISM FROM A SINGLE SET OF BLOOD CULTURES WHEN MULTIPLE SETS ARE DRAWN IS UNCERTAIN. PLEASE NOTIFY THE MICROBIOLOGY DEPARTMENT WITHIN ONE WEEK IF SPECIATION AND SENSITIVITIES ARE REQUIRED. Performed at [facility], [address], [place], [place] [address]    REPTSTATUS 09/17/2022 FINAL 09/12/2022 1434   REPTSTATUS 09/15/2022 FINAL 09/12/2022 1434    Cardiac Enzymes: No results for input(s): "CKTOTAL", "CKMB", "CKMBINDEX", "TROPONINI" in the last 168 hours. CBG: Recent Labs  Lab 09/23/22 0734 09/23/22 1213 09/23/22 1609 09/23/22 2136 09/24/22 0608  GLUCAP 96 83 175* 103* 68*   Iron Studies:  No results for input(s): "IRON", "TIBC", "TRANSFERRIN", "FERRITIN" in the last 72 hours.  @lablastinr3 @ Studies/Results: No results found. Medications:  anticoagulant sodium citrate       amiodarone  200 mg Oral Daily   apixaban  2.5 mg Oral BID   atorvastatin  40 mg Oral Daily   Chlorhexidine Gluconate Cloth  6 each Topical Q0600   [  START ON 09/26/2022] darbepoetin (ARANESP) injection - NON-DIALYSIS  150 mcg Subcutaneous Q Sat-1800   ezetimibe  10 mg Oral Daily   feeding supplement (NEPRO CARB STEADY)  237 mL Oral Q24H   heparin sodium (porcine)  10 mL Intracatheter Once   hydrocortisone cream   Topical TID   insulin aspart  0-6 Units Subcutaneous TID WC   insulin glargine-yfgn  3 Units Subcutaneous QHS   pantoprazole  40 mg Oral Daily   polyethylene glycol  17 g Oral Daily   witch hazel-glycerin   Topical TID    Assessment/PlanVirginia E [name] is an 82 y.o. female atrial fibrillation, DM type 2, HFrEF, HTN, HL,  currently admitted for ESBL E coli UTI and CVA for whom nephrology is consulted for evaluation and management of AKI on CKD.    **AKI on CKD:  baseline Cr was in mid 2s  > 08/2022 admission presenting with Cr 4.55 and developed uremic.  No nephrology care outpt.  UA consistent with UTI.  Imaging with no new findings - do not suspect mild hydro on L significantly contributing.  Modest hypotension noted - suspect ATN related to acute infection + hypovolemia.  S/p IV fluid challenge and kidney function continued to worsen and develooped uremic symptoms.   Temp R internal jugular HD cath placed 7/15; tunneled 7/19 HD #1 7/15 Cont on MWF schedule for now, no heparin CLIP'd to [facility] SW for HD MWF at 12:15 she is okay to DC from nephrology perspective She is making some urien (550cc yesterday); ctm for recovery outpatient   **acute CVA: post central gyrus, neurology following.  PT/OT have been consulted.    **Hyponatremia:  mild, stable, HD addressing  **Hyperkalemia: improved / resolved with HD   **Anemia, normocytic:  Baseline in the past few months 8-9s.  Dropped this AM from 7-8s to 5.8 with report of dark stools. Iron sat 11% - s/p  IV. FE.  Started ESA 7/13.  Transfusion per primary.    **HFrEF:  EF 35% 08/2022.  UF with HD   **UTI: ESBL Klebsiella, sens only to imipenem. Finished ABX   **R renal hypoechoic structure:  rads rec 8mo f/u US ensure stable   **DM: insulin per primary.    **A fib:  on  BB, amiodarone.   Will follow, call with concerns.   Darnell Level, MD  09/24/2022, 10:00 AM  [facility]

## 2022-09-24 NOTE — TOC Transition Note (Signed)
Transition of Care [facility]) - CM/SW Discharge Note   Patient Details  Name: [name] [name] MRN: [id number] Date of Birth: [birth year]  [facility] CM/SW Contact:  Deatra Robinson, [place] Phone Number: 09/24/2022, 11:12 AM   Clinical Narrative: Pt for dc to [facility] today. Spoke to [place] in admissions who confirmed they are prepared to admit pt to room 1106P. Pt aware of dc and reports agreeable. RN provided with number for report and [facility] arranged for transport. SW signing off at dc.   Dellie Burns, MSW, LCSW [phone] (coverage)        Final next level of care: Skilled Nursing Facility Barriers to Discharge: No Barriers Identified   Patient Goals and CMS Choice CMS Medicare.gov Compare Post Acute Care list provided to:: Patient Choice offered to / list presented to : Patient  Discharge Placement                Patient chooses bed at: [facility] Patient to be transferred to facility by: PTAR Name of family member notified: Pt to update family Patient and family notified of of transfer: 09/24/22  Discharge Plan and Services Additional resources added to the After Visit Summary for       Post Acute Care Choice: NA                               Social Determinants of Health (SDOH) Interventions SDOH Screenings   Food Insecurity: No Food Insecurity (09/06/2022)  Housing: Low Risk  (09/06/2022)  Transportation Needs: No Transportation Needs (09/06/2022)  Utilities: Not At Risk (09/06/2022)  Alcohol Screen: Low Risk  (08/22/2020)  Depression (PHQ2-9): Low Risk  (08/21/2022)  Financial Resource Strain: Low Risk  (08/22/2020)  Physical Activity: Inactive (08/22/2020)  Social Connections: Moderately Integrated (08/22/2020)  Stress: No Stress Concern Present (08/22/2020)  Tobacco Use: Medium Risk (09/07/2022)     Readmission Risk Interventions    08/06/2022    3:31 PM 07/23/2022   12:43 PM  Readmission Risk Prevention Plan  Transportation  Screening Complete Complete  PCP or Specialist Appt within 5-7 Days  Complete  PCP or Specialist Appt within 3-5 Days Complete   Home Care Screening  Complete  Medication Review (RN CM)  Referral to Pharmacy  HRI or Home Care Consult Complete   Social Work Consult for Recovery Care Planning/Counseling --   Palliative Care Screening Not Applicable   Medication Review Oceanographer) Complete

## 2022-09-24 NOTE — Plan of Care (Signed)
  Problem: Education: Goal: Ability to describe self-care measures that may prevent or decrease complications (Diabetes Survival Skills Education) will improve Outcome: Progressing Goal: Individualized Educational Video(s) Outcome: Progressing   Problem: Coping: Goal: Ability to adjust to condition or change in health will improve Outcome: Progressing   Problem: Fluid Volume: Goal: Ability to maintain a balanced intake and output will improve Outcome: Progressing   Problem: Health Behavior/Discharge Planning: Goal: Ability to identify and utilize available resources and services will improve Outcome: Progressing Goal: Ability to manage health-related needs will improve Outcome: Progressing   Problem: Metabolic: Goal: Ability to maintain appropriate glucose levels will improve Outcome: Progressing   Problem: Nutritional: Goal: Maintenance of adequate nutrition will improve Outcome: Progressing Goal: Progress toward achieving an optimal weight will improve Outcome: Progressing   Problem: Skin Integrity: Goal: Risk for impaired skin integrity will decrease Outcome: Progressing   Problem: Tissue Perfusion: Goal: Adequacy of tissue perfusion will improve Outcome: Progressing   Problem: Education: Goal: Knowledge of disease or condition will improve Outcome: Progressing Goal: Knowledge of secondary prevention will improve (MUST DOCUMENT ALL) Outcome: Progressing Goal: Knowledge of patient specific risk factors will improve (Mark N/A or DELETE if not current risk factor) Outcome: Progressing   Problem: Ischemic Stroke/TIA Tissue Perfusion: Goal: Complications of ischemic stroke/TIA will be minimized Outcome: Progressing   Problem: Coping: Goal: Will verbalize positive feelings about self Outcome: Progressing Goal: Will identify appropriate support needs Outcome: Progressing   Problem: Health Behavior/Discharge Planning: Goal: Ability to manage health-related needs  will improve Outcome: Progressing Goal: Goals will be collaboratively established with patient/family Outcome: Progressing   Problem: Self-Care: Goal: Ability to participate in self-care as condition permits will improve Outcome: Progressing Goal: Verbalization of feelings and concerns over difficulty with self-care will improve Outcome: Progressing Goal: Ability to communicate needs accurately will improve Outcome: Progressing   Problem: Nutrition: Goal: Risk of aspiration will decrease Outcome: Progressing Goal: Dietary intake will improve Outcome: Progressing   Problem: Education: Goal: Knowledge of General Education information will improve Description: Including pain rating scale, medication(s)/side effects and non-pharmacologic comfort measures Outcome: Progressing   Problem: Health Behavior/Discharge Planning: Goal: Ability to manage health-related needs will improve Outcome: Progressing   Problem: Clinical Measurements: Goal: Ability to maintain clinical measurements within normal limits will improve Outcome: Progressing Goal: Will remain free from infection Outcome: Progressing Goal: Diagnostic test results will improve Outcome: Progressing Goal: Respiratory complications will improve Outcome: Progressing Goal: Cardiovascular complication will be avoided Outcome: Progressing   Problem: Activity: Goal: Risk for activity intolerance will decrease Outcome: Progressing   Problem: Nutrition: Goal: Adequate nutrition will be maintained Outcome: Progressing   Problem: Coping: Goal: Level of anxiety will decrease Outcome: Progressing   Problem: Elimination: Goal: Will not experience complications related to bowel motility Outcome: Progressing Goal: Will not experience complications related to urinary retention Outcome: Progressing   Problem: Pain Managment: Goal: General experience of comfort will improve Outcome: Progressing   Problem: Safety: Goal:  Ability to remain free from injury will improve Outcome: Progressing   Problem: Skin Integrity: Goal: Risk for impaired skin integrity will decrease Outcome: Progressing   

## 2022-09-24 NOTE — Significant Event (Signed)
Report called to receiving RN at [facility]. Patient took all her personal belongings with her in bag (clothes, eye-glasses, dentures). Patient decline for RN to call her spouse; says he is aware already. Patient transported to [facility] by [facility] at 1130am.

## 2022-10-06 NOTE — Telephone Encounter (Signed)
Left message on machine for pt to callback to schedule

## 2022-10-07 ENCOUNTER — Inpatient Hospital Stay (HOSPITAL_COMMUNITY): Payer: Medicare Other

## 2022-10-07 ENCOUNTER — Emergency Department (HOSPITAL_COMMUNITY): Payer: Medicare Other

## 2022-10-07 ENCOUNTER — Inpatient Hospital Stay (HOSPITAL_COMMUNITY)
Admission: EM | Admit: 2022-10-07 | Discharge: 2022-10-17 | DRG: 314 | Disposition: A | Discharge status: Skilled Nursing Facility | Payer: Medicare Other | Source: Ambulatory Visit | Attending: Internal Medicine | Admitting: Internal Medicine

## 2022-10-07 DIAGNOSIS — Z8673 Personal history of transient ischemic attack (TIA), and cerebral infarction without residual deficits: Secondary | ICD-10-CM

## 2022-10-07 DIAGNOSIS — I5023 Acute on chronic systolic (congestive) heart failure: Secondary | ICD-10-CM | POA: Diagnosis present

## 2022-10-07 DIAGNOSIS — L89151 Pressure ulcer of sacral region, stage 1: Secondary | ICD-10-CM | POA: Diagnosis present

## 2022-10-07 DIAGNOSIS — I9589 Other hypotension: Secondary | ICD-10-CM | POA: Diagnosis present

## 2022-10-07 DIAGNOSIS — E11319 Type 2 diabetes mellitus with unspecified diabetic retinopathy without macular edema: Secondary | ICD-10-CM | POA: Diagnosis present

## 2022-10-07 DIAGNOSIS — Z1612 Extended spectrum beta lactamase (ESBL) resistance: Secondary | ICD-10-CM | POA: Diagnosis present

## 2022-10-07 DIAGNOSIS — Z6841 Body Mass Index (BMI) 40.0 and over, adult: Secondary | ICD-10-CM | POA: Diagnosis not present

## 2022-10-07 DIAGNOSIS — N184 Chronic kidney disease, stage 4 (severe): Secondary | ICD-10-CM

## 2022-10-07 DIAGNOSIS — E876 Hypokalemia: Secondary | ICD-10-CM | POA: Diagnosis not present

## 2022-10-07 DIAGNOSIS — I469 Cardiac arrest, cause unspecified: Secondary | ICD-10-CM

## 2022-10-07 DIAGNOSIS — R7881 Bacteremia: Secondary | ICD-10-CM | POA: Diagnosis not present

## 2022-10-07 DIAGNOSIS — Z794 Long term (current) use of insulin: Secondary | ICD-10-CM

## 2022-10-07 DIAGNOSIS — I482 Chronic atrial fibrillation, unspecified: Secondary | ICD-10-CM | POA: Diagnosis present

## 2022-10-07 DIAGNOSIS — Z951 Presence of aortocoronary bypass graft: Secondary | ICD-10-CM

## 2022-10-07 DIAGNOSIS — E8809 Other disorders of plasma-protein metabolism, not elsewhere classified: Secondary | ICD-10-CM | POA: Diagnosis present

## 2022-10-07 DIAGNOSIS — T827XXA Infection and inflammatory reaction due to other cardiac and vascular devices, implants and grafts, initial encounter: Secondary | ICD-10-CM

## 2022-10-07 DIAGNOSIS — I132 Hypertensive heart and chronic kidney disease with heart failure and with stage 5 chronic kidney disease, or end stage renal disease: Secondary | ICD-10-CM | POA: Diagnosis present

## 2022-10-07 DIAGNOSIS — N189 Chronic kidney disease, unspecified: Secondary | ICD-10-CM | POA: Diagnosis present

## 2022-10-07 DIAGNOSIS — B961 Klebsiella pneumoniae [K. pneumoniae] as the cause of diseases classified elsewhere: Secondary | ICD-10-CM | POA: Diagnosis not present

## 2022-10-07 DIAGNOSIS — Z8249 Family history of ischemic heart disease and other diseases of the circulatory system: Secondary | ICD-10-CM

## 2022-10-07 DIAGNOSIS — I5021 Acute systolic (congestive) heart failure: Secondary | ICD-10-CM | POA: Diagnosis present

## 2022-10-07 DIAGNOSIS — Z1624 Resistance to multiple antibiotics: Secondary | ICD-10-CM | POA: Diagnosis present

## 2022-10-07 DIAGNOSIS — A4159 Other Gram-negative sepsis: Secondary | ICD-10-CM | POA: Diagnosis present

## 2022-10-07 DIAGNOSIS — A419 Sepsis, unspecified organism: Secondary | ICD-10-CM

## 2022-10-07 DIAGNOSIS — R4189 Other symptoms and signs involving cognitive functions and awareness: Principal | ICD-10-CM

## 2022-10-07 DIAGNOSIS — Z833 Family history of diabetes mellitus: Secondary | ICD-10-CM

## 2022-10-07 DIAGNOSIS — T80211A Bloodstream infection due to central venous catheter, initial encounter: Secondary | ICD-10-CM | POA: Diagnosis not present

## 2022-10-07 DIAGNOSIS — I252 Old myocardial infarction: Secondary | ICD-10-CM

## 2022-10-07 DIAGNOSIS — Z66 Do not resuscitate: Secondary | ICD-10-CM | POA: Diagnosis not present

## 2022-10-07 DIAGNOSIS — R5381 Other malaise: Secondary | ICD-10-CM | POA: Diagnosis present

## 2022-10-07 DIAGNOSIS — J189 Pneumonia, unspecified organism: Secondary | ICD-10-CM

## 2022-10-07 DIAGNOSIS — B370 Candidal stomatitis: Secondary | ICD-10-CM | POA: Diagnosis not present

## 2022-10-07 DIAGNOSIS — N186 End stage renal disease: Secondary | ICD-10-CM | POA: Diagnosis present

## 2022-10-07 DIAGNOSIS — I429 Cardiomyopathy, unspecified: Secondary | ICD-10-CM | POA: Diagnosis present

## 2022-10-07 DIAGNOSIS — D631 Anemia in chronic kidney disease: Secondary | ICD-10-CM | POA: Diagnosis present

## 2022-10-07 DIAGNOSIS — E11649 Type 2 diabetes mellitus with hypoglycemia without coma: Secondary | ICD-10-CM | POA: Diagnosis not present

## 2022-10-07 DIAGNOSIS — Z79899 Other long term (current) drug therapy: Secondary | ICD-10-CM

## 2022-10-07 DIAGNOSIS — E785 Hyperlipidemia, unspecified: Secondary | ICD-10-CM | POA: Diagnosis present

## 2022-10-07 DIAGNOSIS — L8990 Pressure ulcer of unspecified site, unspecified stage: Secondary | ICD-10-CM | POA: Insufficient documentation

## 2022-10-07 DIAGNOSIS — A499 Bacterial infection, unspecified: Secondary | ICD-10-CM | POA: Diagnosis not present

## 2022-10-07 DIAGNOSIS — Z7983 Long term (current) use of bisphosphonates: Secondary | ICD-10-CM

## 2022-10-07 DIAGNOSIS — Z7901 Long term (current) use of anticoagulants: Secondary | ICD-10-CM

## 2022-10-07 DIAGNOSIS — R0789 Other chest pain: Secondary | ICD-10-CM

## 2022-10-07 DIAGNOSIS — Z880 Allergy status to penicillin: Secondary | ICD-10-CM

## 2022-10-07 DIAGNOSIS — E871 Hypo-osmolality and hyponatremia: Secondary | ICD-10-CM | POA: Diagnosis present

## 2022-10-07 DIAGNOSIS — I5022 Chronic systolic (congestive) heart failure: Secondary | ICD-10-CM

## 2022-10-07 DIAGNOSIS — Y848 Other medical procedures as the cause of abnormal reaction of the patient, or of later complication, without mention of misadventure at the time of the procedure: Secondary | ICD-10-CM | POA: Diagnosis present

## 2022-10-07 DIAGNOSIS — Z992 Dependence on renal dialysis: Secondary | ICD-10-CM

## 2022-10-07 DIAGNOSIS — H5462 Unqualified visual loss, left eye, normal vision right eye: Secondary | ICD-10-CM | POA: Diagnosis present

## 2022-10-07 DIAGNOSIS — M96A3 Multiple fractures of ribs associated with chest compression and cardiopulmonary resuscitation: Secondary | ICD-10-CM | POA: Diagnosis present

## 2022-10-07 DIAGNOSIS — I472 Ventricular tachycardia, unspecified: Secondary | ICD-10-CM | POA: Diagnosis not present

## 2022-10-07 DIAGNOSIS — E861 Hypovolemia: Secondary | ICD-10-CM | POA: Diagnosis present

## 2022-10-07 DIAGNOSIS — I480 Paroxysmal atrial fibrillation: Secondary | ICD-10-CM | POA: Diagnosis present

## 2022-10-07 DIAGNOSIS — Z515 Encounter for palliative care: Secondary | ICD-10-CM | POA: Diagnosis not present

## 2022-10-07 DIAGNOSIS — E1122 Type 2 diabetes mellitus with diabetic chronic kidney disease: Secondary | ICD-10-CM | POA: Diagnosis present

## 2022-10-07 DIAGNOSIS — K219 Gastro-esophageal reflux disease without esophagitis: Secondary | ICD-10-CM | POA: Diagnosis present

## 2022-10-07 DIAGNOSIS — E119 Type 2 diabetes mellitus without complications: Secondary | ICD-10-CM

## 2022-10-07 DIAGNOSIS — I2510 Atherosclerotic heart disease of native coronary artery without angina pectoris: Secondary | ICD-10-CM | POA: Diagnosis present

## 2022-10-07 DIAGNOSIS — R54 Age-related physical debility: Secondary | ICD-10-CM | POA: Diagnosis present

## 2022-10-07 DIAGNOSIS — E1165 Type 2 diabetes mellitus with hyperglycemia: Secondary | ICD-10-CM | POA: Diagnosis present

## 2022-10-07 DIAGNOSIS — Z91158 Patient's noncompliance with renal dialysis for other reason: Secondary | ICD-10-CM

## 2022-10-07 DIAGNOSIS — Z87891 Personal history of nicotine dependence: Secondary | ICD-10-CM

## 2022-10-07 DIAGNOSIS — Z7189 Other specified counseling: Secondary | ICD-10-CM | POA: Diagnosis not present

## 2022-10-07 DIAGNOSIS — Z792 Long term (current) use of antibiotics: Secondary | ICD-10-CM

## 2022-10-07 DIAGNOSIS — I4891 Unspecified atrial fibrillation: Secondary | ICD-10-CM | POA: Diagnosis present

## 2022-10-07 DIAGNOSIS — T827XXD Infection and inflammatory reaction due to other cardiac and vascular devices, implants and grafts, subsequent encounter: Secondary | ICD-10-CM | POA: Diagnosis not present

## 2022-10-07 DIAGNOSIS — Z955 Presence of coronary angioplasty implant and graft: Secondary | ICD-10-CM

## 2022-10-07 LAB — CBC WITH DIFFERENTIAL/PLATELET
Abs Immature Granulocytes: 0.45 10*3/uL — ABNORMAL HIGH (ref 0.00–0.07)
Basophils Absolute: 0 10*3/uL (ref 0.0–0.1)
Basophils Relative: 0 %
Eosinophils Absolute: 0 10*3/uL (ref 0.0–0.5)
Eosinophils Relative: 0 %
HCT: 26.3 % — ABNORMAL LOW (ref 36.0–46.0)
Hemoglobin: 8.1 g/dL — ABNORMAL LOW (ref 12.0–15.0)
Immature Granulocytes: 2 %
Lymphocytes Relative: 17 %
Lymphs Abs: 3.8 10*3/uL (ref 0.7–4.0)
MCH: 28.5 pg (ref 26.0–34.0)
MCHC: 30.8 g/dL (ref 30.0–36.0)
MCV: 92.6 fL (ref 80.0–100.0)
Monocytes Absolute: 1.5 10*3/uL — ABNORMAL HIGH (ref 0.1–1.0)
Monocytes Relative: 7 %
Neutro Abs: 16 10*3/uL — ABNORMAL HIGH (ref 1.7–7.7)
Neutrophils Relative %: 74 %
Platelets: 278 10*3/uL (ref 150–400)
RBC: 2.84 MIL/uL — ABNORMAL LOW (ref 3.87–5.11)
RDW: 19.5 % — ABNORMAL HIGH (ref 11.5–15.5)
WBC: 21.7 10*3/uL — ABNORMAL HIGH (ref 4.0–10.5)
nRBC: 0 % (ref 0.0–0.2)

## 2022-10-07 LAB — COMPREHENSIVE METABOLIC PANEL WITH GFR
ALT: 14 U/L (ref 0–44)
AST: 32 U/L (ref 15–41)
Albumin: <1.5 g/dL — ABNORMAL LOW (ref 3.5–5.0)
Alkaline Phosphatase: 86 U/L (ref 38–126)
Anion gap: 9 (ref 5–15)
BUN: 15 mg/dL (ref 8–23)
CO2: 28 mmol/L (ref 22–32)
Calcium: 8.2 mg/dL — ABNORMAL LOW (ref 8.9–10.3)
Chloride: 94 mmol/L — ABNORMAL LOW (ref 98–111)
Creatinine, Ser: 2.54 mg/dL — ABNORMAL HIGH (ref 0.44–1.00)
GFR, Estimated: 19 mL/min — ABNORMAL LOW
Glucose, Bld: 62 mg/dL — ABNORMAL LOW (ref 70–99)
Potassium: 2.8 mmol/L — ABNORMAL LOW (ref 3.5–5.1)
Sodium: 131 mmol/L — ABNORMAL LOW (ref 135–145)
Total Bilirubin: 0.8 mg/dL (ref 0.3–1.2)
Total Protein: 5.5 g/dL — ABNORMAL LOW (ref 6.5–8.1)

## 2022-10-07 LAB — COMPREHENSIVE METABOLIC PANEL
ALT: 12 U/L (ref 0–44)
AST: 28 U/L (ref 15–41)
Albumin: <1.5 g/dL — ABNORMAL LOW (ref 3.5–5.0)
Alkaline Phosphatase: 77 U/L (ref 38–126)
Anion gap: 11 (ref 5–15)
BUN: 16 mg/dL (ref 8–23)
CO2: 27 mmol/L (ref 22–32)
Calcium: 7.7 mg/dL — ABNORMAL LOW (ref 8.9–10.3)
Chloride: 92 mmol/L — ABNORMAL LOW (ref 98–111)
Creatinine, Ser: 2.53 mg/dL — ABNORMAL HIGH (ref 0.44–1.00)
GFR, Estimated: 19 mL/min — ABNORMAL LOW (ref >60)
Glucose, Bld: 103 mg/dL — ABNORMAL HIGH (ref 70–99)
Potassium: 3 mmol/L — ABNORMAL LOW (ref 3.5–5.1)
Sodium: 130 mmol/L — ABNORMAL LOW (ref 135–145)
Total Bilirubin: 0.8 mg/dL (ref 0.3–1.2)
Total Protein: 4.8 g/dL — ABNORMAL LOW (ref 6.5–8.1)

## 2022-10-07 LAB — D-DIMER, QUANTITATIVE: D-Dimer, Quant: 2.12 ug/mL-FEU — ABNORMAL HIGH (ref 0.00–0.50)

## 2022-10-07 LAB — GLUCOSE, CAPILLARY
Glucose-Capillary: 120 mg/dL — ABNORMAL HIGH (ref 70–99)
Glucose-Capillary: 47 mg/dL — ABNORMAL LOW (ref 70–99)
Glucose-Capillary: 73 mg/dL (ref 70–99)
Glucose-Capillary: 75 mg/dL (ref 70–99)

## 2022-10-07 LAB — ECHOCARDIOGRAM COMPLETE
AR max vel: 2.7 cm2
AV Area VTI: 2.52 cm2
AV Area mean vel: 2.17 cm2
AV Mean grad: 3 mmHg
AV Peak grad: 4.5 mmHg
Ao pk vel: 1.06 m/s
Area-P 1/2: 5.64 cm2
Calc EF: 32 %
MV VTI: 3.52 cm2
S' Lateral: 3.8 cm
Single Plane A2C EF: 35.5 %
Single Plane A4C EF: 29.7 %

## 2022-10-07 LAB — TROPONIN I (HIGH SENSITIVITY)
Troponin I (High Sensitivity): 61 ng/L — ABNORMAL HIGH (ref <18)
Troponin I (High Sensitivity): 73 ng/L — ABNORMAL HIGH (ref <18)

## 2022-10-07 LAB — MRSA NEXT GEN BY PCR, NASAL: MRSA by PCR Next Gen: NOT DETECTED

## 2022-10-07 LAB — BRAIN NATRIURETIC PEPTIDE: B Natriuretic Peptide: 1531.4 pg/mL — ABNORMAL HIGH (ref 0.0–100.0)

## 2022-10-07 LAB — MAGNESIUM: Magnesium: 1.6 mg/dL — ABNORMAL LOW (ref 1.7–2.4)

## 2022-10-07 LAB — APTT: aPTT: 43 seconds — ABNORMAL HIGH (ref 24–36)

## 2022-10-07 LAB — HEPATITIS B SURFACE ANTIGEN: Hepatitis B Surface Ag: NONREACTIVE

## 2022-10-07 LAB — PROTIME-INR
INR: 2.3 — ABNORMAL HIGH (ref 0.8–1.2)
Prothrombin Time: 25.2 seconds — ABNORMAL HIGH (ref 11.4–15.2)

## 2022-10-07 LAB — PHOSPHORUS: Phosphorus: 2.1 mg/dL — ABNORMAL LOW (ref 2.5–4.6)

## 2022-10-07 LAB — HEPARIN LEVEL (UNFRACTIONATED): Heparin Unfractionated: >1.1 IU/mL — ABNORMAL HIGH (ref 0.30–0.70)

## 2022-10-07 MED ORDER — VANCOMYCIN HCL 1750 MG/350ML IV SOLN
1750.0000 mg | Freq: Once | INTRAVENOUS | Status: AC
  Administered 2022-10-07: 1750 mg via INTRAVENOUS
  Filled 2022-10-07: qty 350

## 2022-10-07 MED ORDER — AMIODARONE HCL 200 MG PO TABS
200.0000 mg | ORAL_TABLET | Freq: Every day | ORAL | Status: DC
  Administered 2022-10-08 – 2022-10-17 (×9): 200 mg via ORAL
  Filled 2022-10-07 (×9): qty 1

## 2022-10-07 MED ORDER — FENTANYL CITRATE PF 50 MCG/ML IJ SOSY
25.0000 ug | PREFILLED_SYRINGE | Freq: Once | INTRAMUSCULAR | Status: AC
  Administered 2022-10-07: 25 ug via INTRAVENOUS

## 2022-10-07 MED ORDER — CHLORHEXIDINE GLUCONATE CLOTH 2 % EX PADS
6.0000 | MEDICATED_PAD | Freq: Every day | CUTANEOUS | Status: DC
  Administered 2022-10-07 – 2022-10-09 (×4): 6 via TOPICAL

## 2022-10-07 MED ORDER — FUROSEMIDE 10 MG/ML IJ SOLN
20.0000 mg | Freq: Once | INTRAMUSCULAR | Status: AC
  Administered 2022-10-07: 20 mg via INTRAVENOUS
  Filled 2022-10-07: qty 2

## 2022-10-07 MED ORDER — SODIUM CHLORIDE 0.9 % IV SOLN
2.0000 g | Freq: Once | INTRAVENOUS | Status: AC
  Administered 2022-10-07: 2 g via INTRAVENOUS
  Filled 2022-10-07: qty 12.5

## 2022-10-07 MED ORDER — FENTANYL CITRATE PF 50 MCG/ML IJ SOSY
PREFILLED_SYRINGE | INTRAMUSCULAR | Status: AC
  Filled 2022-10-07: qty 1

## 2022-10-07 MED ORDER — POLYETHYLENE GLYCOL 3350 17 G PO PACK
17.0000 g | PACK | Freq: Every day | ORAL | Status: DC | PRN
  Administered 2022-10-10 – 2022-10-11 (×2): 17 g via ORAL
  Filled 2022-10-07 (×2): qty 1

## 2022-10-07 MED ORDER — CHLORHEXIDINE GLUCONATE CLOTH 2 % EX PADS
6.0000 | MEDICATED_PAD | Freq: Every day | CUTANEOUS | Status: DC
  Administered 2022-10-08 – 2022-10-09 (×2): 6 via TOPICAL

## 2022-10-07 MED ORDER — FENTANYL CITRATE PF 50 MCG/ML IJ SOSY: 12.5000 ug | PREFILLED_SYRINGE | Freq: Once | INTRAMUSCULAR | Status: DC | PRN

## 2022-10-07 MED ORDER — VANCOMYCIN VARIABLE DOSE PER UNSTABLE RENAL FUNCTION (PHARMACIST DOSING): Status: DC

## 2022-10-07 MED ORDER — INSULIN ASPART 100 UNIT/ML IJ SOLN
2.0000 [IU] | INTRAMUSCULAR | Status: DC
  Administered 2022-10-08 (×2): 2 [IU] via SUBCUTANEOUS
  Administered 2022-10-09: 4 [IU] via SUBCUTANEOUS
  Administered 2022-10-09: 6 [IU] via SUBCUTANEOUS
  Administered 2022-10-09: 4 [IU] via SUBCUTANEOUS

## 2022-10-07 MED ORDER — APIXABAN 2.5 MG PO TABS: 2.5000 mg | ORAL_TABLET | Freq: Two times a day (BID) | ORAL | Status: DC

## 2022-10-07 MED ORDER — SODIUM CHLORIDE 0.9 % IV BOLUS
500.0000 mL | Freq: Once | INTRAVENOUS | Status: AC
  Administered 2022-10-07: 500 mL via INTRAVENOUS

## 2022-10-07 MED ORDER — INSULIN ASPART 100 UNIT/ML IV SOLN: 5.0000 [IU] | Freq: Once | INTRAVENOUS | Status: DC

## 2022-10-07 MED ORDER — CALCIUM GLUCONATE-NACL 2-0.675 GM/100ML-% IV SOLN
2.0000 g | Freq: Once | INTRAVENOUS | Status: DC
  Filled 2022-10-07: qty 100

## 2022-10-07 MED ORDER — MAGNESIUM SULFATE 2 GM/50ML IV SOLN: 2.0000 g | Freq: Once | INTRAVENOUS | Status: DC

## 2022-10-07 MED ORDER — MAGNESIUM SULFATE IN D5W 1-5 GM/100ML-% IV SOLN
1.0000 g | Freq: Once | INTRAVENOUS | Status: AC
  Administered 2022-10-07: 1 g via INTRAVENOUS
  Filled 2022-10-07: qty 100

## 2022-10-07 MED ORDER — DOCUSATE SODIUM 100 MG PO CAPS
100.0000 mg | ORAL_CAPSULE | Freq: Two times a day (BID) | ORAL | Status: DC | PRN
  Administered 2022-10-11: 100 mg via ORAL
  Filled 2022-10-07: qty 1

## 2022-10-07 MED ORDER — ACETAMINOPHEN 500 MG PO TABS
1000.0000 mg | ORAL_TABLET | Freq: Four times a day (QID) | ORAL | Status: DC
  Administered 2022-10-07 – 2022-10-09 (×7): 1000 mg via ORAL
  Filled 2022-10-07 (×7): qty 2

## 2022-10-07 MED ORDER — OXYCODONE HCL 5 MG PO TABS
5.0000 mg | ORAL_TABLET | ORAL | Status: DC | PRN
  Filled 2022-10-07: qty 1

## 2022-10-07 MED ORDER — SODIUM CHLORIDE 0.9 % IV SOLN
250.0000 mL | INTRAVENOUS | Status: DC
  Administered 2022-10-11: 250 mL via INTRAVENOUS

## 2022-10-07 MED ORDER — DEXTROSE 50 % IV SOLN
INTRAVENOUS | Status: AC
  Filled 2022-10-07: qty 50

## 2022-10-07 MED ORDER — SODIUM CHLORIDE 0.9 % IV SOLN
1.0000 g | INTRAVENOUS | Status: DC
  Filled 2022-10-07: qty 10

## 2022-10-07 MED ORDER — DEXTROSE 50 % IV SOLN
1.0000 | Freq: Once | INTRAVENOUS | Status: DC
  Filled 2022-10-07: qty 50

## 2022-10-07 MED ORDER — PERFLUTREN LIPID MICROSPHERE
1.0000 mL | INTRAVENOUS | Status: AC | PRN
  Administered 2022-10-07: 3 mL via INTRAVENOUS

## 2022-10-07 MED ORDER — POTASSIUM CHLORIDE CRYS ER 20 MEQ PO TBCR
40.0000 meq | EXTENDED_RELEASE_TABLET | Freq: Once | ORAL | Status: AC
  Administered 2022-10-07: 40 meq via ORAL
  Filled 2022-10-07: qty 2

## 2022-10-07 MED ORDER — FENTANYL CITRATE PF 50 MCG/ML IJ SOSY: 25.0000 ug | PREFILLED_SYRINGE | INTRAMUSCULAR | Status: DC | PRN

## 2022-10-07 MED ORDER — NOREPINEPHRINE 4 MG/250ML-% IV SOLN
2.0000 ug/min | INTRAVENOUS | Status: DC
  Administered 2022-10-07: 5 ug/min via INTRAVENOUS

## 2022-10-07 MED ORDER — HEPARIN (PORCINE) 25000 UT/250ML-% IV SOLN
1000.0000 [IU]/h | INTRAVENOUS | Status: DC
  Administered 2022-10-07: 1000 [IU]/h via INTRAVENOUS
  Filled 2022-10-07: qty 250

## 2022-10-07 NOTE — Consult Note (Signed)
Cardiology Consultation   Patient ID: [name] [name] MRN: [id number]; DOB: 10/23/1940  Admit date: 10/07/2022 Date of Consult: 10/07/2022  PCP:  [name], MD   [facility] Providers Cardiologist:  Chilton Si, MD        Patient Profile:   [name] [name] is a 82 y.o. female with a hx of LV dysfunction, PAF and CAD, hypertension, type 2 diabetes, who is being seen 10/07/2022 for the evaluation of witnessed cardiac arrest at the request of Dr. Chestine Spore.  History of Present Illness:   [name] [name] is an 82 year old married Caucasian female who we are asked to see because of witnessed cardiac arrest status post resuscitation at rehab facility when she was undergoing dialysis.  She is a patient of Dr. Leonides Sake.  She has a history of CKD 5 having recently started hemodialysis.  She currently has a clot rehab facility after a prolonged hospitalization a month and a half ago.  Echo performed at that time showed an EF of 35%.  She does have a history of CAD status post non-STEMI 3/17 with Which showed 100% occluded OM1.  This was treated with balloon angioplasty but no stent because of the size of the vessel.  While in the hospital with previous admission she was noted to be in A-fib with chest pain.  The chest pain improved with rate control.  She underwent successful cardioversion by Dr. Duke Salvia 08/05/2022 and was discharged home on Coumadin anticoagulation.   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   HYPERLIPIDEMIA  03/27/2006   HYPERTENSION 03/27/2006   Intertrochanteric fracture of right hip (HCC) 07/13/2012   LUNG NODULE 09/01/2006   Excision, Bx Benighn   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"   Osteopenia 2004   Dexa 2004 showed Osteopenia, DEXA 03/2007 normal   Osteopenia    Other chronic cystitis with hematuria    Pelvic fracture (HCC) 06/16/2016   S/P fall   Recurrent urinary tract infection    Seeing Urology   RETINOPATHY, BACKGROUND NOS 03/27/2006   Seizures (HCC)    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    Past Surgical History:  Procedure Laterality Date   ANKLE FRACTURE SURGERY Bilateral 08/2015   "right one was in 2 places"   CARDIAC CATHETERIZATION N/A 05/17/2015   Procedure: Left Heart Cath and Coronary Angiography;  Surgeon: Corky Crafts, MD;  Location: [facility] INVASIVE CV LAB;  Service: Cardiovascular;  Laterality: N/A;   CARDIAC CATHETERIZATION N/A 05/17/2015   Procedure: Coronary Balloon Angioplasty;  Surgeon: Corky Crafts, MD;  Location: [facility] INVASIVE CV LAB;  Service: Cardiovascular;  Laterality: N/A;   CARDIOVERSION N/A 08/05/2022   Procedure: CARDIOVERSION;  Surgeon: Chilton Si, MD;  Location: [facility] INVASIVE CV LAB;  Service: Cardiovascular;  Laterality: N/A;   FEMUR IM NAIL Right 07/15/2012   Procedure: INTRAMEDULLARY (IM) NAIL HIP;  Surgeon: Eulas Post, MD;  Location: [facility] OR;  Service: Orthopedics;  Laterality: Right;   FEMUR  IM NAIL Left 12/02/2015   Procedure: INTRAMEDULLARY (IM) NAIL FEMORAL;  Surgeon: Eldred Manges, MD;  Location: [facility] OR;  Service: Orthopedics;  Laterality: Left;   FRACTURE SURGERY     INCISION AND DRAINAGE HIP Left 01/03/2016   Procedure: IRRIGATION AND DEBRIDEMENT HIP;  Surgeon: Eldred Manges, MD;  Location: [facility] OR;  Service: Orthopedics;  Laterality: Left;   IR FLUORO GUIDE CV LINE RIGHT  09/14/2022   IR FLUORO GUIDE CV LINE RIGHT  09/18/2022   IR US GUIDE VASC ACCESS RIGHT  09/14/2022   SHOULDER SURGERY Bilateral    "don't remember which side"      Home Medications:  Prior to Admission medications   Medication Sig Start Date End Date Taking? Authorizing Provider  alendronate (FOSAMAX) 35 MG tablet Take 35 mg by mouth every 7 (seven) days. Take with a full glass of water on an empty stomach.   Yes [provider]  amiodarone (PACERONE) 200 MG tablet Take 1 tablet (200 mg total) by mouth daily. 08/13/22  Yes Zannie Cove, MD  apixaban (ELIQUIS) 2.5 MG TABS tablet Take 1 tablet (2.5 mg total) by mouth 2 (two) times daily. To start likely on Friday 6/14 if recommended by Coumadin clinic after labs done 08/21/22  Yes Chilton Si, MD  atorvastatin (LIPITOR) 40 MG tablet Take 1 tablet (40 mg total) by mouth daily. 09/24/22  Yes Burnadette Pop, MD  ezetimibe (ZETIA) 10 MG tablet Take 1 tablet (10 mg total) by mouth daily. 12/04/21  Yes Chilton Si, MD  insulin NPH-regular Human (70-30) 100 UNIT/ML injection Inject 10 Units into the skin See admin instructions. 10 units before breakfast   Yes [provider]  insulin NPH-regular Human (NOVOLIN 70/30 RELION) (70-30) 100 UNIT/ML injection 10 units in the morning and 5 units at night. Patient taking differently: Inject 5 Units into the skin See admin instructions. 5 units daily before dinner. 07/23/22  Yes Dahal, Melina Schools, MD  Multiple Vitamins-Minerals (MULTIVITAMIN ADULT PO) Take 1 tablet by mouth daily with breakfast.    Yes [provider]  nystatin (MYCOSTATIN/NYSTOP) powder Apply 1 Application topically 2 (two) times daily. 10/05/22  Yes [provider]  pantoprazole (PROTONIX) 40 MG tablet Take 1 tablet (40 mg total) by mouth daily. 09/24/22  Yes Burnadette Pop, MD  polyethylene glycol (MIRALAX / GLYCOLAX) 17 g packet Take 17 g by mouth daily. 09/24/22  Yes Burnadette Pop, MD  VITAMIN D, CHOLECALCIFEROL, PO Take 1 tablet by mouth daily.   Yes [provider]  hydrocortisone 2.5 % cream Apply topically 2 (two) times daily. Patient not  taking: Reported on 10/07/2022 08/31/22   Sharlene Dory, DO    Inpatient Medications: Scheduled Meds:  acetaminophen  1,000 mg Oral Q6H   [START ON 10/08/2022] amiodarone  200 mg Oral Daily   Chlorhexidine Gluconate Cloth  6 each Topical Daily   dextrose  1 ampule Intravenous Once   dextrose       furosemide  20 mg Intravenous Once   insulin aspart  2-6 Units Subcutaneous Q4H   potassium chloride  40 mEq Oral Once   vancomycin variable dose per unstable renal function (pharmacist dosing)   Does not apply See admin instructions   Continuous Infusions:  sodium chloride Stopped (10/07/22 1346)   calcium gluconate     [START ON 10/08/2022] ceFEPime (MAXIPIME) IV     magnesium sulfate bolus IVPB     norepinephrine (LEVOPHED) Adult infusion 2 mcg/min (10/07/22 1530)   vancomycin 1,750  mg (10/07/22 1633)   PRN Meds: dextrose, docusate sodium, fentaNYL (SUBLIMAZE) injection, oxyCODONE, polyethylene glycol  Allergies:    Allergies  Allergen Reactions   Procaine Hcl Anaphylaxis   Amoxicillin Itching    Social History:   Social History   Socioeconomic History   Marital status: Married    Spouse name: [name]   Number of children: 1   Years of education: College   Highest education level: Not on file  Occupational History   Occupation: retired     Associate Professor: RETIRED  Tobacco Use   Smoking status: Former    Current packs/day: 0.00    Types: Cigarettes    Quit date: 03/02/1978    Years since quitting: 44.6   Smokeless tobacco: Never  Vaping Use   Vaping status: Never Used  Substance and Sexual Activity   Alcohol use: No    Alcohol/week: 0.0 standard drinks of alcohol   Drug use: No   Sexual activity: Not on file  Other Topics Concern   Not on file  Social History Narrative   Patient lives at home spouse. Uses a walker consistently.   Moved from [place] 2004   1 son, problems w/ drugs, passed away 13-Feb-2016-- OD       Social Determinants of Health   Financial Resource  Strain: Low Risk  (08/22/2020)   Overall Financial Resource Strain (CARDIA)    Difficulty of Paying Living Expenses: Not hard at all  Food Insecurity: No Food Insecurity (09/06/2022)   Hunger Vital Sign    Worried About Running Out of Food in the Last Year: Never true    Ran Out of Food in the Last Year: Never true  Transportation Needs: No Transportation Needs (09/06/2022)   PRAPARE - Administrator, Civil Service (Medical): No    Lack of Transportation (Non-Medical): No  Physical Activity: Inactive (08/22/2020)   Exercise Vital Sign    Days of Exercise per Week: 0 days    Minutes of Exercise per Session: 0 min  Stress: No Stress Concern Present (08/22/2020)   [employer] of Occupational Health - Occupational Stress Questionnaire    Feeling of Stress : Not at all  Social Connections: Moderately Integrated (08/22/2020)   Social Connection and Isolation Panel [NHANES]    Frequency of Communication with Friends and Family: More than three times a week    Frequency of Social Gatherings with Friends and Family: More than three times a week    Attends Religious Services: More than 4 times per year    Active Member of [employer] or Organizations: No    Attends Banker Meetings: Never    Marital Status: Married  Catering manager Violence: Not At Risk (09/06/2022)   Humiliation, Afraid, Rape, and Kick questionnaire    Fear of Current or Ex-Partner: No    Emotionally Abused: No    Physically Abused: No    Sexually Abused: No    Family History:    Family History  Problem Relation Age of Onset   Diabetes Father    Heart attack Father 22   Diabetes Sister    Drug abuse Son    Cancer Neg Hx        no hx of colon or breast cancer     ROS:  Please see the history of present illness.   All other ROS reviewed and negative.     Physical Exam/Data:   Vitals:   10/07/22 1515 10/07/22 1600 10/07/22 1719 10/07/22 1729  BP:  107/67 122/69    Pulse: (!) 135 90    Resp:  17 18    Temp:   (!) 97.3 F (36.3 C)   TempSrc:   Oral   SpO2: 93% 94%    Weight:    79.6 kg    Intake/Output Summary (Last 24 hours) at 10/07/2022 1730 Last data filed at 10/07/2022 1530 Gross per 24 hour  Intake 29.97 ml  Output --  Net 29.97 ml      10/07/2022    5:29 PM 09/24/2022    5:00 AM 09/23/2022    7:45 AM  Last 3 Weights  Weight (lbs) 175 lb 7.8 oz 170 lb 6.7 oz 174 lb 2.6 oz  Weight (kg) 79.6 kg 77.3 kg 79 kg     Body mass index is 25.91 kg/m.  General:  Well nourished, well developed, in no acute distress HEENT: normal Neck: no JVD Vascular: No carotid bruits; Distal pulses 2+ bilaterally Cardiac: Irregularly irregular rhythm Lungs:  clear to auscultation bilaterally, no wheezing, rhonchi or rales  Abd: soft, nontender, no hepatomegaly  Ext: 2+ edema.  Legs are wrapped. Musculoskeletal:  No deformities, BUE and BLE strength normal and equal Skin: warm and dry  Neuro:  CNs 2-12 intact, no focal abnormalities noted Psych:  Normal affect   EKG:  The EKG was personally reviewed and demonstrates: Atrial fibrillation with RVR, heart rate of 146 with bundle branch block. Telemetry:  Telemetry was personally reviewed and demonstrates: Atrial fibrillation with a ventricular response of 95  Relevant CV Studies: 2D echo pending  Laboratory Data:  High Sensitivity Troponin:   Recent Labs  Lab 10/07/22 1529  TROPONINIHS 61*     Chemistry Recent Labs  Lab 10/07/22 1529  NA 131*  K 2.8*  CL 94*  CO2 28  GLUCOSE 62*  BUN 15  CREATININE 2.54*  CALCIUM 8.2*  MG 1.6*  GFRNONAA 19*  ANIONGAP 9    Recent Labs  Lab 10/07/22 1529  PROT 5.5*  ALBUMIN <1.5*  AST 32  ALT 14  ALKPHOS 86  BILITOT 0.8   Lipids No results for input(s): "CHOL", "TRIG", "HDL", "LABVLDL", "LDLCALC", "CHOLHDL" in the last 168 hours.  Hematology Recent Labs  Lab 10/07/22 1332  WBC 21.7*  RBC 2.84*  HGB 8.1*  HCT 26.3*  MCV 92.6  MCH 28.5  MCHC 30.8  RDW 19.5*  PLT 278    Thyroid No results for input(s): "TSH", "FREET4" in the last 168 hours.  BNP Recent Labs  Lab 10/07/22 1333  BNP 1,531.4*    DDimer No results for input(s): "DDIMER" in the last 168 hours.   Radiology/Studies:  ECHOCARDIOGRAM COMPLETE  Result Date: 10/07/2022    ECHOCARDIOGRAM REPORT   Patient Name:   [name] [name] Date of Exam: 10/07/2022 Medical Rec #:  [id number]         Height:       69.0 in Accession #:    [id number]        Weight:       170.4 lb Date of Birth:  Oct 09, 1940        BSA:          1.930 m Patient Age:    81 years          BP:           125/81 mmHg Patient Gender: F                 HR:  138 bpm. Exam Location:  Inpatient Procedure: 2D Echo, Cardiac Doppler, Color Doppler and Intracardiac            Opacification Agent STAT ECHO Indications:    Cardiac Arrest  History:        Patient has prior history of Echocardiogram examinations, most                 recent 08/05/2022. CHF, CAD, CKD and Stroke, Arrythmias:Atrial                 Fibrillation; Risk Factors:Hypertension, Dyslipidemia and                 Diabetes.  Sonographer:    Wallie Char Referring Phys: [id number] Steffanie Dunn  Sonographer Comments: Image acquisition challenging due to patient body habitus. Messaged MD @ 3:15pm IMPRESSIONS  1. Left ventricular ejection fraction, by estimation, is 25 to 30%. The left ventricle has severely decreased function. The left ventricle demonstrates global hypokinesis. Left ventricular diastolic function could not be evaluated.  2. Right ventricular systolic function was not well visualized. The right ventricular size is normal. There is moderately elevated pulmonary artery systolic pressure. The estimated right ventricular systolic pressure is 57.6 mmHg.  3. The mitral valve is degenerative. Trivial mitral valve regurgitation. No evidence of mitral stenosis. Moderate mitral annular calcification.  4. The aortic valve is tricuspid. Aortic valve regurgitation is not visualized.  Aortic valve sclerosis/calcification is present, without any evidence of aortic stenosis. Aortic valve area, by VTI measures 2.52 cm. Aortic valve mean gradient measures 3.0 mmHg. Aortic valve Vmax measures 1.06 m/s.  5. The inferior vena cava is dilated in size with >50% respiratory variability, suggesting right atrial pressure of 8 mmHg. FINDINGS  Left Ventricle: Left ventricular ejection fraction, by estimation, is 25 to 30%. The left ventricle has severely decreased function. The left ventricle demonstrates global hypokinesis. Definity contrast agent was given IV to delineate the left ventricular endocardial borders. The left ventricular internal cavity size was normal in size. There is no left ventricular hypertrophy. Left ventricular diastolic function could not be evaluated due to atrial fibrillation. Left ventricular diastolic function could not be evaluated. Right Ventricle: The right ventricular size is normal. No increase in right ventricular wall thickness. Right ventricular systolic function was not well visualized. There is moderately elevated pulmonary artery systolic pressure. The tricuspid regurgitant velocity is 3.52 m/s, and with an assumed right atrial pressure of 8 mmHg, the estimated right ventricular systolic pressure is 57.6 mmHg. Left Atrium: Left atrial size was normal in size. Right Atrium: Right atrial size was normal in size. Pericardium: There is no evidence of pericardial effusion. Mitral Valve: The mitral valve is degenerative in appearance. There is moderate thickening of the mitral valve leaflet(s). There is moderate calcification of the mitral valve leaflet(s). Moderate mitral annular calcification. Trivial mitral valve regurgitation. No evidence of mitral valve stenosis. MV peak gradient, 5.3 mmHg. The mean mitral valve gradient is 3.0 mmHg. Tricuspid Valve: The tricuspid valve is normal in structure. Tricuspid valve regurgitation is mild . No evidence of tricuspid stenosis.  Aortic Valve: The aortic valve is tricuspid. Aortic valve regurgitation is not visualized. Aortic valve sclerosis/calcification is present, without any evidence of aortic stenosis. Aortic valve mean gradient measures 3.0 mmHg. Aortic valve peak gradient measures 4.5 mmHg. Aortic valve area, by VTI measures 2.52 cm. Pulmonic Valve: The pulmonic valve was normal in structure. Pulmonic valve regurgitation is not visualized. No evidence of pulmonic stenosis. Aorta: The aortic  root is normal in size and structure. Venous: The inferior vena cava is dilated in size with greater than 50% respiratory variability, suggesting right atrial pressure of 8 mmHg. IAS/Shunts: No atrial level shunt detected by color flow Doppler.  LEFT VENTRICLE PLAX 2D LVIDd:         4.10 cm      Diastology LVIDs:         3.80 cm      LV e' medial:  5.80 cm/s LV PW:         0.80 cm      LV e' lateral: 11.40 cm/s LV IVS:        0.80 cm LVOT diam:     1.90 cm LV SV:         46 LV SV Index:   24 LVOT Area:     2.84 cm  LV Volumes (MOD) LV vol d, MOD A2C: 142.0 ml LV vol d, MOD A4C: 83.1 ml LV vol s, MOD A2C: 91.6 ml LV vol s, MOD A4C: 58.4 ml LV SV MOD A2C:     50.4 ml LV SV MOD A4C:     83.1 ml LV SV MOD BP:      37.1 ml RIGHT VENTRICLE             IVC RV Basal diam:  3.10 cm     IVC diam: 2.10 cm RV S prime:     13.20 cm/s TAPSE (M-mode): 1.1 cm LEFT ATRIUM             Index        RIGHT ATRIUM           Index LA diam:        3.50 cm 1.81 cm/m   RA Area:     11.30 cm LA Vol (A2C):   37.9 ml 19.64 ml/m  RA Volume:   20.20 ml  10.47 ml/m LA Vol (A4C):   52.8 ml 27.36 ml/m LA Biplane Vol: 47.2 ml 24.45 ml/m  AORTIC VALVE AV Area (Vmax):    2.70 cm AV Area (Vmean):   2.17 cm AV Area (VTI):     2.52 cm AV Vmax:           105.50 cm/s AV Vmean:          84.950 cm/s AV VTI:            0.182 m AV Peak Grad:      4.5 mmHg AV Mean Grad:      3.0 mmHg LVOT Vmax:         100.40 cm/s LVOT Vmean:        65.100 cm/s LVOT VTI:          0.161 m LVOT/AV VTI  ratio: 0.89  AORTA Ao Root diam: 3.30 cm Ao Asc diam:  3.30 cm MITRAL VALVE              TRICUSPID VALVE MV Area (PHT): 5.64 cm   TV Peak grad:   54.5 mmHg MV Area VTI:   3.52 cm   TV Vmax:        3.69 m/s MV Peak grad:  5.3 mmHg   TR Peak grad:   49.6 mmHg MV Mean grad:  3.0 mmHg   TR Vmax:        352.00 cm/s MV Vmax:       1.15 m/s MV Vmean:      78.2 cm/s  SHUNTS  Systemic VTI:  0.16 m                           Systemic Diam: 1.90 cm Armanda Magic MD Electronically signed by Armanda Magic MD Signature Date/Time: 10/07/2022/3:33:15 PM    Final    DG Chest Portable 1 View  Result Date: 10/07/2022 CLINICAL DATA:  chest pain s/p CPR cardiac arrest EXAM: PORTABLE CHEST 1 VIEW COMPARISON:  09/11/2022. FINDINGS: There are findings suggestive of congestive heart failure/pulmonary edema, grossly similar to the prior study. Redemonstration of left retrocardiac airspace opacity obscuring the left hemidiaphragm, descending thoracic aorta and blunting the left lateral costophrenic angle suggesting combination of left lower lobe atelectasis and/or consolidation with pleural effusion. No significant interval change. Right lateral costophrenic angle is clear. Stable cardio-mediastinal silhouette. No acute osseous abnormalities. The soft tissues are within normal limits. Right IJ hemodialysis catheter noted with its tip overlying the cavoatrial junction region. IMPRESSION: 1. Congestive heart failure/pulmonary edema. 2. Left lower lobe atelectasis and/or consolidation with pleural effusion. Electronically Signed   By: Jules Schick M.D.   On: 10/07/2022 14:39     Assessment and Plan:   Cardiac arrest-unclear etiology.  She does have LV dysfunction.  She is significantly hypokalemic with serum potassium of 2.8 and hypomagnesemic with a magnesium of 1.6.  Arrest occurred during dialysis.  It is unclear whether this was related to electrolyte abnormality which led to a malignant arrhythmia or  myocardial ischemia.  She does have a history of CAD although this was remote.  She denies chest pain.  She had CPR and had ROSC.  She is currently alert and responsive.  She is no longer on pressors.  2D echo is pending.  She is a DNR. Paroxysmal atrial fibrillation: Apparently she was sent home on Coumadin.  She did have successful cardioversion on 08/05/2022 and her last EKG in our chart showed sinus bradycardia.  She is currently in A-fib.  She may require repeat cardioversion although it is unclear whether she has been on anticoagulant.  She should be on IV heparin.  She has been on amiodarone as an outpatient. Hypertension: Blood pressure is 108 systolic.  She was on pressors which have been discontinued CKD 4: Hemodialysis was recently started.  This will need to be continued. LV dysfunction: EF has been in the 35% range.  Her BNP is elevated at 1500.  Troponins are relatively low at 61 considering that she is status post witnessed cardiac arrest with CPR.  Hold off on oral anticoagulation for now.  2D echo is pending.  May need to settle for rate control.   Risk Assessment/Risk Scores:  1      New York Heart Association (NYHA) Functional Class NYHA Class I  CHA2DS2-VASc Score = 7   This indicates a 11.2% annual risk of stroke. The patient's score is based upon: CHF History: 1 HTN History: 1 Diabetes History: 1 Stroke History: 0 Vascular Disease History: 1 Age Score: 2 Gender Score: 1         For questions or updates, please contact [facility] Please consult [email or web address] for contact info under    Signed, Nanetta Batty, MD  10/07/2022 5:30 PM

## 2022-10-07 NOTE — IPAL (Signed)
  Interdisciplinary Goals of Care Family Meeting   Date carried out: 10/07/2022  Location of the meeting: Bedside in the ED  Member's involved: Physician, Bedside Registered Nurse, and Family Member or next of kin  Durable Power of Attorney or acting medical decision maker: [name] [name]    Discussion: We discussed goals of care for [name] [name] .  I discussed care options for [name] [name] if her heart stops again-- she was clear she does not want more chest compressions if she has another cardiac arrest, but she is fine with defibrillation, meds, or intubation & mechanical ventilation. Her husband initially disagreed with this but wants to honor her wishes. He was present throughout the discussion. She feels like she has had a lot of suffering as her body has been breaking down over time.   Code status:   Code Status: DNR-- ok with shocks, intubation, meds   Disposition: Continue current acute care  Time spent for the meeting: 5 min.    Steffanie Dunn, DO  10/07/2022, 3:36 PM

## 2022-10-07 NOTE — Progress Notes (Signed)
ED Pharmacy Antibiotic Sign Off An antibiotic consult was received from an ED provider for sepsis per pharmacy dosing for vancomycin and cefepime. A chart review was completed to assess appropriateness. ESRD-HD  The following one time order(s) were placed:  Vancomycin 1750 mg IV x 1 Cefepime 2g IV x 1  Further antibiotic and/or antibiotic pharmacy consults should be ordered by the admitting provider if indicated.   Thank you for allowing pharmacy to be a part of this patient's care.   Daylene Posey, [facility]  Clinical Pharmacist 10/07/22 2:12 PM

## 2022-10-07 NOTE — Progress Notes (Signed)
[facility]ST consulted to draw labs off HD catheter. Notified MD that nephrologist order is needed to draw labs from [facility]. SecureChat sent to Delano Metz, MD (nephrologist) who approved HD catheter use for labs. [place], MD responded that repeat potassium WNL and to hold off on lab draw from [facility] for now.

## 2022-10-07 NOTE — H&P (Addendum)
NAME:  [name] [name], MRN:  [id number], DOB:  1940-03-30, LOS: 0 ADMISSION DATE:  10/07/2022, CONSULTATION DATE:  10/07/2022 REFERRING MD:  Steffanie Dunn, DO, CHIEF COMPLAINT:  Out of hospital cardiac arrest  History of Present Illness:  82 year old with history of diabetes, chronic systolic heart failure, ESRD on HD.  Was in her usual state of health this morning, feeling well, no signs of anything amiss.  Went to dialysis from her rehab facility per usual.  Took around 20 minutes to get access and start treatment.  After about 10 minutes into treatment she blacked out.  Next thing she remembers she was in an ambulance on the way to the hospital.  Bystanders reported 4 rounds of CPR without shock or epinephrine, suspect BLS care was administered on site.  By arrival of EMS she was alert and oriented.  1 g calcium was administered while en route to the hospital out of concern for wide-complex tachycardia.  Pertinent  Medical History  ESRD on HD, MWF Chronic systolic heart failure, EF 35 to 40% Paroxysmal A-fib Anemia and chronic kidney disease Insulin-dependent diabetes History of stroke Coronary artery disease status post stent in 2017  Significant Hospital Events: Including procedures, antibiotic start and stop dates in addition to other pertinent events   8/7 admit to [facility] MICU AOx4 s/p out of hospital cardiac arrest, in A-fib RVR, on low-dose norepi infusion  Interim History / Subjective:  She has chest pain to palpation and deep breathing from chest compressions.  She denies dyspnea.  She had no fevers or signs or symptoms of infection prior to or during dialysis.  In ED she was treated with norepinephrine infusion for hypotension, which was being weaned down.  She was in A-fib with RVR.  EKG showed no STEMI, but rather A-fib with RVR and perhaps a bundle branch block, but no wide-complex tachycardia.  Had trouble getting labs due to hemolysis.  Objective   Blood pressure 122/69,  pulse 90, temperature (!) 97.2 F (36.2 C), temperature source Oral, resp. rate 18, SpO2 94%.        Intake/Output Summary (Last 24 hours) at 10/07/2022 1659 Last data filed at 10/07/2022 1530 Gross per 24 hour  Intake 29.97 ml  Output --  Net 29.97 ml   There were no vitals filed for this visit.  Examination: No acute distress Heart rate is tachycardic, rhythm is irregular, palpable peripheral pulses, diffuse peripheral edema Breathing is regular nonlabored on room air, anterior lung fields clear Chest tender to palpation Abdomen nontender Skin is warm and dry Alert and oriented, follows instructions and answers questions appropriately, moves all extremities, no facial asymmetry, speech is normal sounding  Labs: Sodium 131 Potassium 2.8 Glucose 62 BUN/creatinine 15/2.54 Phos 2.1 Mag 1.6 Albumin <1.5 BNP 1500 Troponin 61 => WBC 21.7 Hemoglobin 8.1 Platelets 278 Blood cultures drawn and pending  Imaging: CXR with some patchy airspace opacities, left lower lobe consolidation, no apparent rib fractures  Resolved Hospital Problem list   Resolved Problems:   * No resolved hospital problems. *   Assessment & Plan:  Principal Problem:   Cardiac arrest (HCC) Active Problems:   Severe obesity (BMI >= 40) (HCC)   Diabetes (HCC)   Acute systolic CHF (congestive heart failure) (HCC)   Atrial fibrillation with rapid ventricular response (HCC)   Anemia in chronic kidney disease (CKD)   ESRD on dialysis [facility])  Status post out-of-hospital cardiac arrest Differential at this point is broad but suspicious for cardiac arrest  due to arrhythmia, V-fib, V. tach, sinus pauses all included.  PE is less likely given lack of dyspnea, hypoxia.  Mild hypoglycemia on the CMP, but doubt profound hypoglycemia was the cause.  Septic embolus from infected HD cath considered, but considered to be less likely without any infectious signs or symptoms.  Also possible that she had vasovagal episode  and profound hypotension.  No chest pain prior to event, troponin marginally elevated without ST segment changes on EKG, doubt ACS.  Doing better now, neurologically intact, decreasing pressor requirements. - Admit to ICU for continuous cardiac monitoring - K >4, Mg >2 - Broad empiric antibiotics for sepsis, cultures pending - CT chest pending - Trend troponin  Acute on chronic systolic heart failure No acute respiratory failure.  BNP 1500.  LVEF 25 to 30% down from 35 to 40% on last echo, global hypokinesis, elevated PA systolic pressure but poor visualization of RV systolic function. - Appreciate cardiology assistance - Addition of GDMT as tolerated  ESRD on dialysis Only got about 10 minutes into her session before she became unresponsive.  Electrolytes okay, replacing potassium and magnesium.  No respiratory failure currently. - Appreciate nephrology assistance  Atrial fibrillation with RVR - Therapeutic heparin per pharmacy - Can switch to home DOAC once out of ICU - Continue home amiodarone  Insulin-dependent diabetes - Hold SSI for hypoglycemia  Anemia in chronic kidney disease - Hemoglobin stable  Best Practice (right click and "Reselect all SmartList Selections" daily)   Diet/type: NPO w/ oral meds DVT prophylaxis: systemic heparin GI prophylaxis: N/A Lines: Dialysis Catheter Foley:  N/A Code Status:  DNR, see IPAL note Last date of multidisciplinary goals of care discussion [10/07/22]  Past Medical History:  She,  has a past medical history of Atrial fibrillation (HCC), Blindness of left eye, CKD (chronic kidney disease), stage III (HCC) (06/16/2016), DIABETES MELLITUS, TYPE II (03/27/2006), Eye muscle weakness, GERD (gastroesophageal reflux disease) (10/05/2011), 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   Surgical History:   Past Surgical History:  Procedure Laterality Date   ANKLE FRACTURE SURGERY Bilateral 08/2015   "right one was in 2 places"   CARDIAC CATHETERIZATION N/A 05/17/2015   Procedure: Left Heart Cath and Coronary Angiography;  Surgeon: Corky Crafts, MD;  Location: [facility] INVASIVE CV LAB;  Service: Cardiovascular;  Laterality: N/A;   CARDIAC CATHETERIZATION N/A 05/17/2015   Procedure: Coronary Balloon Angioplasty;  Surgeon: Corky Crafts, MD;  Location: [facility] INVASIVE CV LAB;  Service: Cardiovascular;  Laterality: N/A;   CARDIOVERSION N/A 08/05/2022   Procedure: CARDIOVERSION;  Surgeon: Chilton Si, MD;  Location: [facility] INVASIVE CV LAB;  Service: Cardiovascular;  Laterality: N/A;   FEMUR IM NAIL Right 07/15/2012   Procedure: INTRAMEDULLARY (IM) NAIL HIP;  Surgeon: Eulas Post, MD;  Location: [facility] OR;  Service: Orthopedics;  Laterality: Right;   FEMUR IM NAIL Left 12/02/2015   Procedure: INTRAMEDULLARY (IM) NAIL FEMORAL;  Surgeon: Eldred Manges, MD;  Location: [facility] OR;  Service: Orthopedics;  Laterality: Left;   FRACTURE SURGERY     INCISION AND DRAINAGE HIP Left 01/03/2016   Procedure: IRRIGATION AND DEBRIDEMENT HIP;  Surgeon: Eldred Manges, MD;  Location: [facility] OR;  Service: Orthopedics;  Laterality: Left;   IR FLUORO GUIDE CV  LINE RIGHT  09/14/2022   IR FLUORO GUIDE CV LINE RIGHT  09/18/2022   IR US GUIDE VASC ACCESS RIGHT  09/14/2022   SHOULDER SURGERY Bilateral    "don't remember which side"     Social History:   reports that she quit smoking about 44 years ago. Her smoking use included cigarettes. She has never used smokeless tobacco. She reports that she does not drink alcohol and does not use drugs.   Family History:  Her family history includes Diabetes in  her father and sister; Drug abuse in her son; Heart attack (age of onset: 58) in her father. There is no history of Cancer.   Allergies Allergies  Allergen Reactions   Procaine Hcl Anaphylaxis   Amoxicillin Itching     Home Medications  Prior to Admission medications   Medication Sig Start Date End Date Taking? Authorizing Provider  alendronate (FOSAMAX) 35 MG tablet Take 35 mg by mouth every 7 (seven) days. Take with a full glass of water on an empty stomach.    [provider]  amiodarone (PACERONE) 200 MG tablet Take 1 tablet (200 mg total) by mouth daily. 08/13/22   Zannie Cove, MD  apixaban (ELIQUIS) 2.5 MG TABS tablet Take 1 tablet (2.5 mg total) by mouth 2 (two) times daily. To start likely on Friday 6/14 if recommended by Coumadin clinic after labs done 08/21/22   Chilton Si, MD  atorvastatin (LIPITOR) 40 MG tablet Take 1 tablet (40 mg total) by mouth daily. 09/24/22   Burnadette Pop, MD  ezetimibe (ZETIA) 10 MG tablet Take 1 tablet (10 mg total) by mouth daily. 12/04/21   Chilton Si, MD  hydrocortisone 2.5 % cream Apply topically 2 (two) times daily. 08/31/22   Sharlene Dory, DO  insulin NPH-regular Human (NOVOLIN 70/30 RELION) (70-30) 100 UNIT/ML injection 10 units in the morning and 5 units at night. Patient taking differently: Inject 5-23 Units into the skin daily. 07/23/22   Lorin Glass, MD  Multiple Vitamins-Minerals (MULTIVITAMIN ADULT PO) Take 1 tablet by mouth daily with breakfast.     [provider]  pantoprazole (PROTONIX) 40 MG tablet Take 1 tablet (40 mg total) by mouth daily. 09/24/22   Burnadette Pop, MD  polyethylene glycol (MIRALAX / GLYCOLAX) 17 g packet Take 17 g by mouth daily. 09/24/22   Burnadette Pop, MD  VITAMIN D, CHOLECALCIFEROL, PO Take 1 tablet by mouth daily.    [provider]    Marrianne Mood MD 10/07/2022, 5:15 PM

## 2022-10-07 NOTE — Progress Notes (Signed)
Consult received for PIV placement. No appropriate vein found at this time for PIV placement. Bilateral FA edema, small or very deep veins. Discussed POC with nurse. VAST recommends CVC access at this time if needing more vascular access. Tomasita Morrow, RN VAST

## 2022-10-07 NOTE — Consult Note (Signed)
Renal Service Consult Note [facility]  [name] [name] 10/07/2022 Maree Krabbe, MD Requesting Physician: Dr. Chestine Spore, CCM  Reason for Consult: ESRD pt s/p outpatient cardiac arrest HPI: The patient is a 82 y.o. year-old w/ PMH as below who presented to ED after cardiac arrest which occurred during early into her dialysis session (12 min) today. Pt became unresponsive and a pulse could not be found. They did 4 rounds of CPR over 8 minutes. On arrival by EMS pt was Ox 3. They started on epi gtt for low BP en route. In ED EKG showed RBB w/ atrial fib and wide complex, HR 100-150. Labs showed K+ 2.8 and repeat 3.0.  Creat 2.5, BUN 16.  Albumin 1.5.  BNP 1531. CXR showed pulm edema bilat. Pt has known low EF. Pt was admitted to [facility] service.   Records show that pt was in the hospital in July about 2 wks ago and had an acute CVA noted on MRI, and also had acute on CKD requiring initiation of dialysis. Also had UTI w/ bacteremia and completed 7d course of IV abx (meropenem). Hx of CAD sp CABG.   Pt seen in ED.  Pt is alert. Husband at bedside. Chest is a little sore. No sOB. Legs are more swollen than usual.   ROS - denies CP, no joint pain, no HA, no blurry vision, no rash, no diarrhea, no nausea/ vomiting, no dysuria, no difficulty voiding   Past Medical History  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   HYPERLIPIDEMIA 03/27/2006   HYPERTENSION 03/27/2006   Intertrochanteric fracture of right hip (HCC) 07/13/2012   LUNG  NODULE 09/01/2006   Excision, Bx Benighn   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"   Osteopenia 2004   Dexa 2004 showed Osteopenia, DEXA 03/2007 normal   Osteopenia    Other chronic cystitis with hematuria    Pelvic fracture (HCC) 06/16/2016   S/P fall   Recurrent urinary tract infection    Seeing Urology   RETINOPATHY, BACKGROUND NOS 03/27/2006   Seizures (HCC)    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   Past Surgical History  Past Surgical History:  Procedure Laterality Date   ANKLE FRACTURE SURGERY Bilateral 08/2015   "right one was in 2 places"   CARDIAC CATHETERIZATION N/A 05/17/2015   Procedure: Left Heart Cath and Coronary Angiography;  Surgeon: Corky Crafts, MD;  Location: [facility] INVASIVE CV LAB;  Service: Cardiovascular;  Laterality: N/A;   CARDIAC CATHETERIZATION N/A 05/17/2015   Procedure: Coronary Balloon Angioplasty;  Surgeon: Corky Crafts, MD;  Location: [facility] INVASIVE CV LAB;  Service: Cardiovascular;  Laterality: N/A;   CARDIOVERSION N/A 08/05/2022   Procedure: CARDIOVERSION;  Surgeon: Chilton Si, MD;  Location: [facility] INVASIVE CV LAB;  Service: Cardiovascular;  Laterality: N/A;   FEMUR IM NAIL Right 07/15/2012   Procedure: INTRAMEDULLARY (IM) NAIL HIP;  Surgeon: Eulas Post, MD;  Location: [facility] OR;  Service: Orthopedics;  Laterality: Right;  FEMUR IM NAIL Left 12/02/2015   Procedure: INTRAMEDULLARY (IM) NAIL FEMORAL;  Surgeon: Eldred Manges, MD;  Location: [facility] OR;  Service: Orthopedics;  Laterality: Left;   FRACTURE SURGERY     INCISION AND DRAINAGE HIP Left 01/03/2016   Procedure: IRRIGATION AND DEBRIDEMENT HIP;  Surgeon: Eldred Manges, MD;  Location: [facility] OR;  Service: Orthopedics;  Laterality: Left;   IR FLUORO GUIDE CV LINE RIGHT  09/14/2022   IR FLUORO GUIDE CV LINE RIGHT  09/18/2022   IR US GUIDE VASC ACCESS RIGHT  09/14/2022   SHOULDER SURGERY Bilateral    "don't remember which side"   Family History  Family History  Problem Relation Age of Onset   Diabetes  Father    Heart attack Father 72   Diabetes Sister    Drug abuse Son    Cancer Neg Hx        no hx of colon or breast cancer   Social History  reports that she quit smoking about 44 years ago. Her smoking use included cigarettes. She has never used smokeless tobacco. She reports that she does not drink alcohol and does not use drugs. Allergies  Allergies  Allergen Reactions   Procaine Hcl Anaphylaxis   Amoxicillin Itching   Home medications Prior to Admission medications   Medication Sig Start Date End Date Taking? Authorizing Provider  alendronate (FOSAMAX) 35 MG tablet Take 35 mg by mouth every 7 (seven) days. Take with a full glass of water on an empty stomach.   Yes [provider]  amiodarone (PACERONE) 200 MG tablet Take 1 tablet (200 mg total) by mouth daily. 08/13/22  Yes Zannie Cove, MD  apixaban (ELIQUIS) 2.5 MG TABS tablet Take 1 tablet (2.5 mg total) by mouth 2 (two) times daily. To start likely on Friday 6/14 if recommended by Coumadin clinic after labs done 08/21/22  Yes Chilton Si, MD  atorvastatin (LIPITOR) 40 MG tablet Take 1 tablet (40 mg total) by mouth daily. 09/24/22  Yes Burnadette Pop, MD  ezetimibe (ZETIA) 10 MG tablet Take 1 tablet (10 mg total) by mouth daily. 12/04/21  Yes Chilton Si, MD  insulin NPH-regular Human (70-30) 100 UNIT/ML injection Inject 10 Units into the skin See admin instructions. 10 units before breakfast   Yes [provider]  insulin NPH-regular Human (NOVOLIN 70/30 RELION) (70-30) 100 UNIT/ML injection 10 units in the morning and 5 units at night. Patient taking differently: Inject 5 Units into the skin See admin instructions. 5 units daily before dinner. 07/23/22  Yes Dahal, Melina Schools, MD  Multiple Vitamins-Minerals (MULTIVITAMIN ADULT PO) Take 1 tablet by mouth daily with breakfast.    Yes [provider]  nystatin (MYCOSTATIN/NYSTOP) powder Apply 1 Application topically 2 (two) times daily. 10/05/22  Yes  [provider]  pantoprazole (PROTONIX) 40 MG tablet Take 1 tablet (40 mg total) by mouth daily. 09/24/22  Yes Burnadette Pop, MD  polyethylene glycol (MIRALAX / GLYCOLAX) 17 g packet Take 17 g by mouth daily. 09/24/22  Yes Burnadette Pop, MD  VITAMIN D, CHOLECALCIFEROL, PO Take 1 tablet by mouth daily.   Yes [provider]  hydrocortisone 2.5 % cream Apply topically 2 (two) times daily. Patient not taking: Reported on 10/07/2022 08/31/22   Sharlene Dory, DO     Vitals:   10/07/22 1600 10/07/22 1719 10/07/22 1729 10/07/22 1800  BP: 122/69  118/69 90/65  Pulse: 90  (!) 107 85  Resp: 18  18 15   Temp:  [name])  97.3 F (36.3 C)    TempSrc:  Oral    SpO2: 94%  100% 100%  Weight:   79.6 kg    Exam Gen alert, no distress, looks a bit weak at the moment No rash, cyanosis or gangrene Sclera anicteric, throat clear  No jvd or bruits Chest clear bilat to bases, no rales/ wheezing RRR no MRG Abd soft ntnd no mass or ascites +bs GU defer MS no joint effusions or deformity Ext 2+ bilat LE edema, no wounds or ulcers Neuro is alert, Ox 3 , nf          Home meds include - alendronate, amiodarone, apixaban, atrovastatin, ezetimibe, insulin nph-regular, MVI, nystatin, pantoprazole, miralax, vit D, hydrocortisone cream 2.5%     OP HD: MWF SW  3.5h   400/600   78kg  2/2 bath  RIJ TDC  Heparin none - last OP HD 8/05, post wt 78.1, only 1.8kg removed - started OP HD around 7/26 - venofer 100mg  three times per week IV w hd thru 8/28   Assessment/ Plan: Cardiac arrest - in pt w/ known systolic HF and recently started on dialysis. Work up in progress per CCM. Cardiology consulting.  Volume overload - has sig LE edema, will need dry wt lowered progressively over time. CXR showed pulm edema, not real symptomatic. Will try max UF w/ HD tomorrow.  ESRD - on HD MWF. No indication for acute HD tonight, will plan HD early tomorrow.   BP - BP's are low here today. Outpt BP's  are 100-125 sbp range.   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 Phos is low. Hold any binders for now. Follow.  HFrEF - echo today showed LVEF 25-30% Chronic atrial fib - per cards/ CCM. On IV heparin for now.  Debility / deconditioning IDDM      Vinson Moselle  MD [facility] 10/07/2022, 7:20 PM  Recent Labs  Lab 10/07/22 1332 10/07/22 1529 10/07/22 1816  HGB 8.1*  --   --   ALBUMIN  --  <1.5* <1.5*  CALCIUM  --  8.2* 7.7*  PHOS  --  2.1*  --   CREATININE  --  2.54* 2.53*  K  --  2.8* 3.0*   Inpatient medications:  acetaminophen  1,000 mg Oral Q6H   [START ON 10/08/2022] amiodarone  200 mg Oral Daily   Chlorhexidine Gluconate Cloth  6 each Topical Daily   dextrose  1 ampule Intravenous Once   insulin aspart  2-6 Units Subcutaneous Q4H   vancomycin variable dose per unstable renal function (pharmacist dosing)   Does not apply See admin instructions    sodium chloride Stopped (10/07/22 1346)   calcium gluconate     [START ON 10/08/2022] ceFEPime (MAXIPIME) IV     heparin     magnesium sulfate bolus IVPB     norepinephrine (LEVOPHED) Adult infusion 2 mcg/min (10/07/22 1530)   docusate sodium, fentaNYL (SUBLIMAZE) injection, oxyCODONE, polyethylene glycol

## 2022-10-07 NOTE — ED Provider Notes (Signed)
[place] EMERGENCY DEPARTMENT AT [facility] Provider Note   CSN: [id number] Arrival date & time: 10/07/22  1324     History  No chief complaint on file.   Kristia Sherratt Ransford is a 82 y.o. female with history of chron kidney disease, on dialysis, congestive heart failure, paroxysmal A-fib on Eliquis, seizures, presenting to the ED with concern for unresponsive episode.  Per EMS report the patient was at dialysis and has started the first 10 minutes, then became unresponsive.  There was concern that she had no pulse and about 8 minutes of CPR begun prior to EMS arrival.  No code medications were given or defibrillations performed.  The patient and pulseless on their arrival.  EMS gave the patient 1 g of calcium with concern for potential wide-complex tachycardia, and started an epinephrine infusion for hypotension.  Blood pressure was 96/50 en route to the hospital and blood sugar was not hypoglycemic.  The patient was awake alert and oriented.  On arrival the patient is complaining of chest pain and midthoracic pain, worse with movement.  Medical record show the patient was hospitalized and discharged in July, approximately 2 weeks ago, for acute stroke noted on MRI, found also to have acute renal failure superimposed on her chronic kidney disease for which she was started on dialysis.  She had bacteremia due to UTI for which she was started on 7 days and completed of meropenem.  She also has a history of paroxysmal A-fib on Eliquis and metoprolol.  Is a history of coronary disease status post CABG.  Her CODE STATUS was full code at the time  HPI     Home Medications Prior to Admission medications   Medication Sig Start Date End Date Taking? Authorizing Provider  alendronate (FOSAMAX) 35 MG tablet Take 35 mg by mouth every 7 (seven) days. Take with a full glass of water on an empty stomach.    [provider]  amiodarone (PACERONE) 200 MG tablet Take 1 tablet (200 mg total)  by mouth daily. 08/13/22   Zannie Cove, MD  apixaban (ELIQUIS) 2.5 MG TABS tablet Take 1 tablet (2.5 mg total) by mouth 2 (two) times daily. To start likely on Friday 6/14 if recommended by Coumadin clinic after labs done 08/21/22   Chilton Si, MD  atorvastatin (LIPITOR) 40 MG tablet Take 1 tablet (40 mg total) by mouth daily. 09/24/22   Burnadette Pop, MD  ezetimibe (ZETIA) 10 MG tablet Take 1 tablet (10 mg total) by mouth daily. 12/04/21   Chilton Si, MD  hydrocortisone 2.5 % cream Apply topically 2 (two) times daily. 08/31/22   Sharlene Dory, DO  insulin NPH-regular Human (NOVOLIN 70/30 RELION) (70-30) 100 UNIT/ML injection 10 units in the morning and 5 units at night. Patient taking differently: Inject 5-23 Units into the skin daily. 07/23/22   Lorin Glass, MD  Multiple Vitamins-Minerals (MULTIVITAMIN ADULT PO) Take 1 tablet by mouth daily with breakfast.     [provider]  pantoprazole (PROTONIX) 40 MG tablet Take 1 tablet (40 mg total) by mouth daily. 09/24/22   Burnadette Pop, MD  polyethylene glycol (MIRALAX / GLYCOLAX) 17 g packet Take 17 g by mouth daily. 09/24/22   Burnadette Pop, MD  VITAMIN D, CHOLECALCIFEROL, PO Take 1 tablet by mouth daily.    [provider]      Allergies    Procaine hcl and Amoxicillin    Review of Systems   Review of Systems  Physical Exam Updated  Vital Signs BP (!) 147/70   Pulse 84   Temp (!) 97.2 F (36.2 C) (Oral)   Resp 17   SpO2 100%  Physical Exam Constitutional:      General: She is not in acute distress. HENT:     Head: Normocephalic and atraumatic.  Eyes:     Conjunctiva/sclera: Conjunctivae normal.     Pupils: Pupils are equal, round, and reactive to light.  Cardiovascular:     Rate and Rhythm: Tachycardia present. Rhythm irregular.  Pulmonary:     Effort: Pulmonary effort is normal. No respiratory distress.  Abdominal:     General: There is no distension.     Tenderness: There is no  abdominal tenderness.  Musculoskeletal:     Comments: Anterior chest wall pain  Skin:    General: Skin is warm and dry.  Neurological:     General: No focal deficit present.     Mental Status: She is alert. Mental status is at baseline.  Psychiatric:        Mood and Affect: Mood normal.        Behavior: Behavior normal.     ED Results / Procedures / Treatments   Labs (all labs ordered are listed, but only abnormal results are displayed) Labs Reviewed  CBC WITH DIFFERENTIAL/PLATELET - Abnormal; Notable for the following components:      Result Value   WBC 21.7 (*)    RBC 2.84 (*)    Hemoglobin 8.1 (*)    HCT 26.3 (*)    RDW 19.5 (*)    Neutro Abs 16.0 (*)    Monocytes Absolute 1.5 (*)    Abs Immature Granulocytes 0.45 (*)    All other components within normal limits  BRAIN NATRIURETIC PEPTIDE - Abnormal; Notable for the following components:   B Natriuretic Peptide 1,531.4 (*)    All other components within normal limits  CULTURE, BLOOD (ROUTINE X 2)  CULTURE, BLOOD (ROUTINE X 2)  COMPREHENSIVE METABOLIC PANEL  MAGNESIUM  PHOSPHORUS  D-DIMER, QUANTITATIVE  BASIC METABOLIC PANEL  I-STAT CHEM 8, ED  TROPONIN I (HIGH SENSITIVITY)    EKG None  Radiology ECHOCARDIOGRAM COMPLETE  Result Date: 10/07/2022    ECHOCARDIOGRAM REPORT   Patient Name:   AVENLY TURCOTT Date of Exam: 10/07/2022 Medical Rec #:  [id number]         Height:       69.0 in Accession #:    [id number]        Weight:       170.4 lb Date of Birth:  14-Sep-1940        BSA:          1.930 m Patient Age:    81 years          BP:           125/81 mmHg Patient Gender: F                 HR:           138 bpm. Exam Location:  Inpatient Procedure: 2D Echo, Cardiac Doppler, Color Doppler and Intracardiac            Opacification Agent STAT ECHO Indications:    Cardiac Arrest  History:        Patient has prior history of Echocardiogram examinations, most                 recent 08/05/2022. CHF, CAD, CKD and Stroke,  Arrythmias:Atrial  Fibrillation; Risk Factors:Hypertension, Dyslipidemia and                 Diabetes.  Sonographer:    Wallie Char Referring Phys: [id number] Steffanie Dunn  Sonographer Comments: Image acquisition challenging due to patient body habitus. Messaged MD @ 3:15pm IMPRESSIONS  1. Left ventricular ejection fraction, by estimation, is 25 to 30%. The left ventricle has severely decreased function. The left ventricle demonstrates global hypokinesis. Left ventricular diastolic function could not be evaluated.  2. Right ventricular systolic function was not well visualized. The right ventricular size is normal. There is moderately elevated pulmonary artery systolic pressure. The estimated right ventricular systolic pressure is 57.6 mmHg.  3. The mitral valve is degenerative. Trivial mitral valve regurgitation. No evidence of mitral stenosis. Moderate mitral annular calcification.  4. The aortic valve is tricuspid. Aortic valve regurgitation is not visualized. Aortic valve sclerosis/calcification is present, without any evidence of aortic stenosis. Aortic valve area, by VTI measures 2.52 cm. Aortic valve mean gradient measures 3.0 mmHg. Aortic valve Vmax measures 1.06 m/s.  5. The inferior vena cava is dilated in size with >50% respiratory variability, suggesting right atrial pressure of 8 mmHg. FINDINGS  Left Ventricle: Left ventricular ejection fraction, by estimation, is 25 to 30%. The left ventricle has severely decreased function. The left ventricle demonstrates global hypokinesis. Definity contrast agent was given IV to delineate the left ventricular endocardial borders. The left ventricular internal cavity size was normal in size. There is no left ventricular hypertrophy. Left ventricular diastolic function could not be evaluated due to atrial fibrillation. Left ventricular diastolic function could not be evaluated. Right Ventricle: The right ventricular size is normal. No increase in  right ventricular wall thickness. Right ventricular systolic function was not well visualized. There is moderately elevated pulmonary artery systolic pressure. The tricuspid regurgitant velocity is 3.52 m/s, and with an assumed right atrial pressure of 8 mmHg, the estimated right ventricular systolic pressure is 57.6 mmHg. Left Atrium: Left atrial size was normal in size. Right Atrium: Right atrial size was normal in size. Pericardium: There is no evidence of pericardial effusion. Mitral Valve: The mitral valve is degenerative in appearance. There is moderate thickening of the mitral valve leaflet(s). There is moderate calcification of the mitral valve leaflet(s). Moderate mitral annular calcification. Trivial mitral valve regurgitation. No evidence of mitral valve stenosis. MV peak gradient, 5.3 mmHg. The mean mitral valve gradient is 3.0 mmHg. Tricuspid Valve: The tricuspid valve is normal in structure. Tricuspid valve regurgitation is mild . No evidence of tricuspid stenosis. Aortic Valve: The aortic valve is tricuspid. Aortic valve regurgitation is not visualized. Aortic valve sclerosis/calcification is present, without any evidence of aortic stenosis. Aortic valve mean gradient measures 3.0 mmHg. Aortic valve peak gradient measures 4.5 mmHg. Aortic valve area, by VTI measures 2.52 cm. Pulmonic Valve: The pulmonic valve was normal in structure. Pulmonic valve regurgitation is not visualized. No evidence of pulmonic stenosis. Aorta: The aortic root is normal in size and structure. Venous: The inferior vena cava is dilated in size with greater than 50% respiratory variability, suggesting right atrial pressure of 8 mmHg. IAS/Shunts: No atrial level shunt detected by color flow Doppler.  LEFT VENTRICLE PLAX 2D LVIDd:         4.10 cm      Diastology LVIDs:         3.80 cm      LV e' medial:  5.80 cm/s LV PW:         0.80  cm      LV e' lateral: 11.40 cm/s LV IVS:        0.80 cm LVOT diam:     1.90 cm LV SV:          46 LV SV Index:   24 LVOT Area:     2.84 cm  LV Volumes (MOD) LV vol d, MOD A2C: 142.0 ml LV vol d, MOD A4C: 83.1 ml LV vol s, MOD A2C: 91.6 ml LV vol s, MOD A4C: 58.4 ml LV SV MOD A2C:     50.4 ml LV SV MOD A4C:     83.1 ml LV SV MOD BP:      37.1 ml RIGHT VENTRICLE             IVC RV Basal diam:  3.10 cm     IVC diam: 2.10 cm RV S prime:     13.20 cm/s TAPSE (M-mode): 1.1 cm LEFT ATRIUM             Index        RIGHT ATRIUM           Index LA diam:        3.50 cm 1.81 cm/m   RA Area:     11.30 cm LA Vol (A2C):   37.9 ml 19.64 ml/m  RA Volume:   20.20 ml  10.47 ml/m LA Vol (A4C):   52.8 ml 27.36 ml/m LA Biplane Vol: 47.2 ml 24.45 ml/m  AORTIC VALVE AV Area (Vmax):    2.70 cm AV Area (Vmean):   2.17 cm AV Area (VTI):     2.52 cm AV Vmax:           105.50 cm/s AV Vmean:          84.950 cm/s AV VTI:            0.182 m AV Peak Grad:      4.5 mmHg AV Mean Grad:      3.0 mmHg LVOT Vmax:         100.40 cm/s LVOT Vmean:        65.100 cm/s LVOT VTI:          0.161 m LVOT/AV VTI ratio: 0.89  AORTA Ao Root diam: 3.30 cm Ao Asc diam:  3.30 cm MITRAL VALVE              TRICUSPID VALVE MV Area (PHT): 5.64 cm   TV Peak grad:   54.5 mmHg MV Area VTI:   3.52 cm   TV Vmax:        3.69 m/s MV Peak grad:  5.3 mmHg   TR Peak grad:   49.6 mmHg MV Mean grad:  3.0 mmHg   TR Vmax:        352.00 cm/s MV Vmax:       1.15 m/s MV Vmean:      78.2 cm/s  SHUNTS                           Systemic VTI:  0.16 m                           Systemic Diam: 1.90 cm Armanda Magic MD Electronically signed by Armanda Magic MD Signature Date/Time: 10/07/2022/3:33:15 PM    Final    DG Chest Portable 1 View  Result Date: 10/07/2022 CLINICAL DATA:  chest pain s/p CPR cardiac  arrest EXAM: PORTABLE CHEST 1 VIEW COMPARISON:  09/11/2022. FINDINGS: There are findings suggestive of congestive heart failure/pulmonary edema, grossly similar to the prior study. Redemonstration of left retrocardiac airspace opacity obscuring the left hemidiaphragm, descending  thoracic aorta and blunting the left lateral costophrenic angle suggesting combination of left lower lobe atelectasis and/or consolidation with pleural effusion. No significant interval change. Right lateral costophrenic angle is clear. Stable cardio-mediastinal silhouette. No acute osseous abnormalities. The soft tissues are within normal limits. Right IJ hemodialysis catheter noted with its tip overlying the cavoatrial junction region. IMPRESSION: 1. Congestive heart failure/pulmonary edema. 2. Left lower lobe atelectasis and/or consolidation with pleural effusion. Electronically Signed   By: Jules Schick M.D.   On: 10/07/2022 14:39    Procedures .Critical Care  Performed by: Terald Sleeper, MD Authorized by: Terald Sleeper, MD   Critical care provider statement:    Critical care time (minutes):  45   Critical care time was exclusive of:  Separately billable procedures and treating other patients   Critical care was necessary to treat or prevent imminent or life-threatening deterioration of the following conditions:  Circulatory failure and respiratory failure   Critical care was time spent personally by me on the following activities:  Ordering and performing treatments and interventions, ordering and review of laboratory studies, ordering and review of radiographic studies, pulse oximetry, review of old charts, examination of patient and evaluation of patient's response to treatment     Medications Ordered in ED Medications  0.9 %  sodium chloride infusion (0 mLs Intravenous Hold 10/07/22 1346)  norepinephrine (LEVOPHED) 4mg  in (0.016 mg/mL) premix infusion (2 mcg/min Intravenous Infusion Verify 10/07/22 1530)  ceFEPIme (MAXIPIME) 2 g in sodium chloride 0.9 % 100 mL IVPB (2 g Intravenous New Bag/Given 10/07/22 1535)  vancomycin (VANCOREADY) IVPB 1750 mg/350 mL (has no administration in time range)  perflutren lipid microspheres (DEFINITY) IV suspension (3 mLs Intravenous Given 10/07/22  1511)  docusate sodium (COLACE) capsule 100 mg (has no administration in time range)  polyethylene glycol (MIRALAX / GLYCOLAX) packet 17 g (has no administration in time range)  fentaNYL (SUBLIMAZE) injection 12.5 mcg (has no administration in time range)  acetaminophen (TYLENOL) tablet 1,000 mg (has no administration in time range)  fentaNYL (SUBLIMAZE) injection 25 mcg (has no administration in time range)  oxyCODONE (Oxy IR/ROXICODONE) immediate release tablet 5 mg (has no administration in time range)  calcium gluconate 2 g/ 100 mL sodium chloride IVPB (has no administration in time range)  furosemide (LASIX) injection 20 mg (has no administration in time range)  insulin aspart (novoLOG) injection 5 Units (has no administration in time range)    And  dextrose 50 % solution 50 mL (has no administration in time range)  fentaNYL (SUBLIMAZE) injection 25 mcg (25 mcg Intravenous Given 10/07/22 1340)  sodium chloride 0.9 % bolus 500 mL (500 mLs Intravenous New Bag/Given 10/07/22 1526)    ED Course/ Medical Decision Making/ A&P Clinical Course as of 10/07/22 1556  Wed Oct 07, 2022  1333 Ecg appears to show LBBB in A Fib, does not appear like widened QRS to me  [MT]  1406 Spoke to critical care - who requested deferring CT PE given pt's recent initiation of dialysis 1 month ago, concern for contrast nephropathy, and pt being on eliquis already - reasonable to defer for inpatient management/ VQ scan andor dvt ultrasound.  In the meantime we'll obtain CT head and CT chest to eval for aspiration pna and/or fx. [MT]  1409 WBC elevation and tachycardia - concern for sepsis criteria  [MT]  1500 Husband updated - icu team at bedside for admission, BP 140's systolic on peripheral levophed [MT]    Clinical Course User Index [MT] , Kermit Balo, MD                                 Medical Decision Making Amount and/or Complexity of Data Reviewed Labs: ordered. Radiology: ordered.  Risk OTC  drugs. Prescription drug management. Decision regarding hospitalization.   This patient presents to the ED with concern for unresponsiveness. This involves an extensive number of treatment options, and is a complaint that carries with it a high risk of complications and morbidity.  The differential diagnosis includes arrhythmia vs PE vs anemia vs infectio vs other  Co-morbidities that complicate the patient evaluation: ESRD  Additional history obtained from EMS  External records from outside source obtained and reviewed including hospital discharge summary  I ordered and personally interpreted labs.  The pertinent results include: Significant leukocytosis.  I-STAT lab was showing potassium greater than 8.5, sodium 126.  However there is concern about hemolysis on this lab.  We contacted hospital lab at 350 pm and were told CMP sample not available or lost - therefore RN asked to redraw stat K and BMP level.    In the meantime I think it is reasonable to treat for hyperkalemia given the patient has stage renal disease.  She has already received 1 g of calcium gluconate by EMS.  She has received 500 cc of fluid by me.  We will give her a small amount of diuretic with 20 mg of Lasix as well as 5 units of insulin with dextrose.  I am holding off on albuterol given concern for tacky dysrhythmia already.  I ordered imaging studies including x-ray of the chest I independently visualized and interpreted imaging which showed congestive heart failure and potential left lower lobe infiltrate I agree with the radiologist interpretation  The patient was maintained on a cardiac monitor.  I personally viewed and interpreted the cardiac monitored which showed an underlying rhythm of: A-fib with RVR  Per my interpretation the patient's ECG shows A-fib with RVR aberrancy  I ordered medication including hyperkalemic medications per i-STAT report, IV fentanyl for pain.  Broad-spectrum antibiotics with concern  for sepsis pneumonia.  500 cc fluid bolus as judicious fluids, given that the patient has missed dialysis today and has end-stage renal disease, likely will need reassessment of volume status.  I have reviewed the patients home medicines and have made adjustments as needed  Test Considered: At this time have deferred further PE workup to inpatient services, given that the patient is very recently with end-stage renal disease and does have some preserved renal function, at high risk of nephrotoxicity.  She is also compliant with her Eliquis which is a protective factor.  I requested consultation with the critical care,  and discussed lab and imaging findings as well as pertinent plan - they recommend: Medical mission  After the interventions noted above, I reevaluated the patient and found that they have: stayed the same   Dispostion:  After consideration of the diagnostic results and the patients response to treatment, I feel that the patent would benefit from medical admission.         Final Clinical Impression(s) / ED Diagnoses Final diagnoses:  Unresponsiveness  Chest wall pain  Pneumonia of lower  lobe due to infectious organism, unspecified laterality  Sepsis, due to unspecified organism, unspecified whether acute organ dysfunction present [facility])    Rx / DC Orders ED Discharge Orders     None         , Kermit Balo, MD 10/07/22 1557

## 2022-10-07 NOTE — Progress Notes (Signed)
ANTICOAGULATION CONSULT NOTE -  Pharmacy Consult for hepairn Indication: atrial fibrillation  Allergies  Allergen Reactions   Procaine Hcl Anaphylaxis   Amoxicillin Itching    Patient Measurements:   Heparin Dosing Weight: 77 kg  Vital Signs: Temp: 97.2 F (36.2 C) (08/07 1348) Temp Source: Oral (08/07 1348) BP: 147/70 (08/07 1415) Pulse Rate: 84 (08/07 1415)  Labs: Recent Labs    10/07/22 1332  HGB 8.1*  HCT 26.3*  PLT 278    Estimated Creatinine Clearance: 26.3 mL/min (A) (by C-G formula based on SCr of 1.78 mg/dL (H)).   Medications:  Scheduled:   acetaminophen  1,000 mg Oral Q6H   Infusions:   sodium chloride Stopped (10/07/22 1346)   ceFEPime (MAXIPIME) IV 2 g (10/07/22 1535)   norepinephrine (LEVOPHED) Adult infusion 2 mcg/min (10/07/22 1530)   vancomycin      Assessment: 82 yo female presenting after cardiac arrest in dialysis. Pt on Eliquis prior to admission for atrial fibrillation. Hgb 8.1, Plt 278, no signs of bleeding noted. Baseline heparin level and aPTT ordered. Pharmacy consulted to dose heparin for atrial fibrillation.  Goal of Therapy:  Heparin level 0.3-0.7 units/ml aPTT 66-102 seconds Monitor platelets by anticoagulation protocol: Yes   Plan:  Start heparin 1000 units/hr at 2100 8 hour heparin level and aPTT  Monitor heparin level and aPTT until levels correlate Monitor CBC, heparin level/aPTT and s/sx of bleeding    I  10/07/2022,3:39 PM

## 2022-10-07 NOTE — ED Triage Notes (Signed)
Pt BIB [facility] as post-CPR.  Pt was 12 min into dialys when she became unrespopnsive.  Staff couldn't find a pulse so they gave 4 rounds of CPR.  No meds were given, no shocks. Pt was A&O x3 on EMS arrival to scene. EMS gave 1G calcium.  PT was on epi drip on arrival to ER for hypotension.   Pt is in RBB w afib per ems, wide complex. 96/50. H$R 100-150.

## 2022-10-07 NOTE — Progress Notes (Signed)
Dear Doctor: This patient has been identified as a candidate for CVC (low GFR midline/PICC not appropriate for patient) for the following reason (s): IV therapy over 48 hours, poor veins/poor circulatory system (CHF, COPD, emphysema, diabetes, steroid use, IV drug abuse, etc.), restarts due to phlebitis and infiltration in 24 hours, and incompatible drugs (aminophyllin, TPN, heparin, given with an antibiotic) If you agree, please write an order for the indicated device.   Thank you for supporting the early vascular access assessment program.

## 2022-10-07 NOTE — Progress Notes (Signed)
Pharmacy Antibiotic Note  [name] [name] is a 82 y.o. female admitted on 10/07/2022 with sepsis.  Pharmacy has been consulted for vancomycin and cefepime dosing.  WBC 21.7, afebrile.   Plan: Start cefepime 1 gram every 24 hours  Pulse dose vancomycin per HD schedule  Monitor WBC, temperature, and cultures De-escalate as able     Temp (24hrs), Avg:97.2 F (36.2 C), Min:97.2 F (36.2 C), Max:97.2 F (36.2 C)  Recent Labs  Lab 10/07/22 1332  WBC 21.7*    Estimated Creatinine Clearance: 26.3 mL/min (A) (by C-G formula based on SCr of 1.78 mg/dL (H)).    Allergies  Allergen Reactions   Procaine Hcl Anaphylaxis   Amoxicillin Itching    Antimicrobials this admission: 8/7 Vancomycin >>  8/7 Cefepime >>   Dose adjustments this admission:   Microbiology results: 8/7 BCx:    Thank you for allowing pharmacy to be a part of this patient's care.  Griffin Dakin 10/07/2022 4:03 PM

## 2022-10-07 NOTE — ED Notes (Signed)
Echo is at bedside

## 2022-10-08 ENCOUNTER — Inpatient Hospital Stay (HOSPITAL_COMMUNITY): Payer: Medicare Other

## 2022-10-08 DIAGNOSIS — I469 Cardiac arrest, cause unspecified: Secondary | ICD-10-CM | POA: Diagnosis not present

## 2022-10-08 LAB — GLUCOSE, CAPILLARY
Glucose-Capillary: 101 mg/dL — ABNORMAL HIGH (ref 70–99)
Glucose-Capillary: 111 mg/dL — ABNORMAL HIGH (ref 70–99)
Glucose-Capillary: 130 mg/dL — ABNORMAL HIGH (ref 70–99)
Glucose-Capillary: 144 mg/dL — ABNORMAL HIGH (ref 70–99)
Glucose-Capillary: 75 mg/dL (ref 70–99)
Glucose-Capillary: 77 mg/dL (ref 70–99)

## 2022-10-08 LAB — HEPARIN LEVEL (UNFRACTIONATED): Heparin Unfractionated: >1.1 IU/mL — ABNORMAL HIGH (ref 0.30–0.70)

## 2022-10-08 LAB — MAGNESIUM: Magnesium: 1.8 mg/dL (ref 1.7–2.4)

## 2022-10-08 MED ORDER — HEPARIN (PORCINE) 25000 UT/250ML-% IV SOLN: 800.0000 [IU]/h | INTRAVENOUS | Status: DC

## 2022-10-08 MED ORDER — HEPARIN SODIUM (PORCINE) 1000 UNIT/ML IJ SOLN: 3200.0000 [IU] | Freq: Once | INTRAMUSCULAR | Status: AC

## 2022-10-08 MED ORDER — MIDODRINE HCL 5 MG PO TABS
5.0000 mg | ORAL_TABLET | Freq: Once | ORAL | Status: AC
  Administered 2022-10-08: 5 mg via ORAL

## 2022-10-08 MED ORDER — HEPARIN SODIUM (PORCINE) 1000 UNIT/ML IJ SOLN
INTRAMUSCULAR | Status: AC
  Filled 2022-10-08: qty 4

## 2022-10-08 MED ORDER — MIDODRINE HCL 5 MG PO TABS: 10.0000 mg | ORAL_TABLET | ORAL | Status: DC

## 2022-10-08 MED ORDER — ALBUMIN HUMAN 25 % IV SOLN: 25.0000 g | Freq: Once | INTRAVENOUS | Status: AC

## 2022-10-08 MED ORDER — MAGNESIUM SULFATE IN D5W 1-5 GM/100ML-% IV SOLN
1.0000 g | Freq: Once | INTRAVENOUS | Status: AC
  Administered 2022-10-08: 1 g via INTRAVENOUS
  Filled 2022-10-08: qty 100

## 2022-10-08 MED ORDER — MIDODRINE HCL 5 MG PO TABS
5.0000 mg | ORAL_TABLET | Freq: Three times a day (TID) | ORAL | Status: DC
  Administered 2022-10-08 – 2022-10-10 (×6): 5 mg via ORAL
  Filled 2022-10-08 (×6): qty 1

## 2022-10-08 MED ORDER — ALBUMIN HUMAN 25 % IV SOLN
25.0000 g | Freq: Once | INTRAVENOUS | Status: AC
  Administered 2022-10-08: 25 g via INTRAVENOUS

## 2022-10-08 MED ORDER — ALBUMIN HUMAN 25 % IV SOLN
INTRAVENOUS | Status: AC
  Administered 2022-10-08: 25 g via INTRAVENOUS
  Filled 2022-10-08: qty 100

## 2022-10-08 MED ORDER — CHLORHEXIDINE GLUCONATE CLOTH 2 % EX PADS: 6.0000 | MEDICATED_PAD | Freq: Every day | CUTANEOUS | Status: DC

## 2022-10-08 MED ORDER — APIXABAN 2.5 MG PO TABS
2.5000 mg | ORAL_TABLET | Freq: Two times a day (BID) | ORAL | Status: DC
  Administered 2022-10-08 – 2022-10-17 (×19): 2.5 mg via ORAL
  Filled 2022-10-08 (×19): qty 1

## 2022-10-08 NOTE — TOC Initial Note (Addendum)
Transition of Care [facility]) - Initial/Assessment Note    Patient Details  Name: [name] [name] MRN: [id number] Date of Birth: 02/27/1941  Transition of Care [facility]) CM/SW Contact:    Deatra Robinson, [place] Phone Number: 10/08/2022, 2:16 PM  Clinical Narrative:  Pt re-admit from [facility] where she is a STR resident. Spoke to [place] in admissions at [facility] who confirmed pt able to return at dc. Pt currently requiring ICU level of care and not medically stable for dc. Spoke to pt's husband Jonny Ruiz who confirmed plan is for return to [place] at [employer]. SW will follow.   Dellie Burns, MSW, LCSW [phone] (coverage)                   Expected Discharge Plan: Skilled Nursing Facility Barriers to Discharge: Continued Medical Work up   Patient Goals and CMS Choice   CMS Medicare.gov Compare Post Acute Care list provided to:: Patient Choice offered to / list presented to : Patient, Spouse [place] ownership interest in [facility].provided to:: Patient    Expected Discharge Plan and Services       Living arrangements for the past 2 months: Skilled Nursing Facility                                      Prior Living Arrangements/Services Living arrangements for the past 2 months: Skilled Nursing Facility Lives with:: Facility Resident          Need for Family Participation in Patient Care: Yes (Comment)     Criminal Activity/Legal Involvement Pertinent to Current Situation/Hospitalization: No - Comment as needed  Activities of Daily Living      Permission Sought/Granted Permission sought to share information with : Facility Industrial/product designer granted to share information with : Yes, Verbal Permission Granted              Emotional Assessment       Orientation: : Oriented to Self, Oriented to Place, Oriented to  Time, Oriented to Situation Alcohol / Substance Use: Not Applicable Psych Involvement: No  (comment)  Admission diagnosis:  Cardiac arrest (HCC) [I46.9] Chest wall pain [R07.89] Unresponsiveness [R41.89] Pneumonia of lower lobe due to infectious organism, unspecified laterality [J18.9] Sepsis, due to unspecified organism, unspecified whether acute organ dysfunction present [facility]) [A41.9] Patient Active Problem List   Diagnosis Date Noted   Cardiac arrest (HCC) 10/07/2022   ESRD on dialysis (HCC) 10/07/2022   Hypokalemia 10/07/2022   Cardiac arrest, cause unspecified (HCC) 10/07/2022   Hyperkalemia 09/11/2022   Anemia in chronic kidney disease (CKD) 09/09/2022   Chronic systolic CHF (congestive heart failure) (HCC) 09/09/2022   ESBL Bacteremia due to UTI 09/07/2022   Acute CVA (cerebrovascular accident) (HCC) 09/06/2022   A-fib (HCC) 08/04/2022   Atrial fibrillation with rapid ventricular response (HCC) 08/03/2022   Acute renal failure superimposed on stage 4 chronic kidney disease (HCC) 07/20/2022   Supratherapeutic INR 07/08/2022   Hemorrhagic cystitis 07/08/2022   Gross hematuria 07/07/2022   CKD (chronic kidney disease) stage 4, GFR 15-29 ml/min (HCC) 04/10/2020   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   Osteoporosis 07/22/2018   Closed compression fracture of L1 vertebra (HCC) 07/19/2018   Cardiomyopathy (HCC) 04/07/2018   Foot ulcer (HCC) 07/24/2016   Gram-negative infection    CKD (chronic kidney disease) stage 3, GFR 30-59 ml/min (HCC)    Postoperative anemia due to acute blood  loss 01/04/2016   Left hip postoperative wound infection 01/02/2016   Paroxysmal atrial fibrillation (HCC) 05/20/2015   CAD S/P percutaneous coronary angioplasty 05/20/2015   Acute systolic CHF (congestive heart failure) (HCC) 05/20/2015   Acute kidney injury superimposed on chronic kidney disease (HCC) 05/11/2015   PCP NOTES >>>>>>>>>>>>>>>> 12/28/2014   Memory loss 05/17/2014   Diabetes (HCC) 01/23/2014   Anxiety and depression, PCP notes  11/24/2013   Severe obesity (BMI >= 40)  (HCC) 07/14/2013   Encounter for therapeutic drug monitoring 06/02/2013   History of encephalitis 11/18/2012   Intertrochanteric fracture of right hip (HCC) 07/13/2012   GERD (gastroesophageal reflux disease) 10/05/2011   Seizure disorder (HCC) 05/24/2011   Hyponatremia 05/17/2011   Annual physical exam >>>>>>>>>>>>>>>>>>>>>>>>>>>>>>>.. 02/10/2011   LUNG NODULE 09/01/2006   Hyperlipidemia 03/27/2006   Hereditary and idiopathic peripheral neuropathy 03/27/2006   RETINOPATHY, BACKGROUND NOS 03/27/2006   Essential hypertension 03/27/2006   PCP:  [name], MD Pharmacy:   [facility] [address] [place], [place] - [address] [place] [address] Phone: [phone] Fax: [phone]  [facility] [address] [place], [place] - [address] [place] [address] Phone: [phone] Fax: [phone]     Social Determinants of Health (SDOH) Social History: SDOH Screenings   Food Insecurity: No Food Insecurity (09/06/2022)  Housing: Low Risk  (09/06/2022)  Transportation Needs: No Transportation Needs (09/06/2022)  Utilities: Not At Risk (09/06/2022)  Alcohol Screen: Low Risk  (08/22/2020)  Depression (PHQ2-9): Low Risk  (08/21/2022)  Financial Resource Strain: Low Risk  (08/22/2020)  Physical Activity: Inactive (08/22/2020)  Social Connections: Moderately Integrated (08/22/2020)  Stress: No Stress Concern Present (08/22/2020)  Tobacco Use: Medium Risk (09/07/2022)   SDOH Interventions:     Readmission Risk Interventions    08/06/2022    3:31 PM 07/23/2022   12:43 PM  Readmission Risk Prevention Plan  Transportation Screening Complete Complete  PCP or Specialist Appt within 5-7 Days  Complete  PCP or Specialist Appt within 3-5 Days Complete   Home Care Screening  Complete  Medication Review (RN CM)  Referral to Pharmacy  HRI or Home Care Consult Complete   Social Work Consult for Recovery Care Planning/Counseling  --   Palliative Care Screening Not Applicable   Medication Review [employer]) Complete

## 2022-10-08 NOTE — Progress Notes (Addendum)
Called to bedside for V. tach on the monitor.  At the time of my evaluation the patient had undergone an hour or so of dialysis with 1.5 L removed.  Norepinephrine has been started to boost her blood pressure to facilitate volume removal.  She is asymptomatic, reports the same tender chest that she had earlier, caused by rib fractures from CPR.  No heart palpitations or fluttering.  Heart rate is variable between 120 and 140 with a palpable femoral pulse Blood pressure is 112/73  She is in no distress She has palpable right femoral pulses She is breathing comfortably She is alert and oriented  EKG shows A-fib with RVR with brief 4-5 beat runs of tachycardia of a different QRS morphology.  The norepinephrine was turned off.  Within minutes her heart rate decreased, the ectopy on the monitor improved.  Heart rates now variable between 90 and 110, BP is 103/52.  I wonder if she is having arrhythmogenic side effects of the nor epi.  Because her heart rate and ectopy is improved now I will not bolus Amio or metoprolol.  Unfortunately she seems to be not tolerating the nor epi which was helping her facilitate dialysis.  Morning electrolytes showed K >4 and mag of 1.8.  Magnesium is hanging now.  Dialysis RN is administering albumin.

## 2022-10-08 NOTE — Progress Notes (Signed)
   10/08/22 1400  Vitals  Temp 98 F (36.7 C)  Temp Source Oral  BP (!) 112/57  BP Location Right Arm  BP Method Automatic  Patient Position (if appropriate) Lying  During Treatment Monitoring  Intra-Hemodialysis Comments Tx completed  Post Treatment  Dialyzer Clearance Clotted  Duration of HD Treatment -hour(s) 3.5 hour(s)  Hemodialysis Intake (mL) 0 mL  Liters Processed 70  Fluid Removed (mL) 2700 mL  Tolerated HD Treatment Yes  Post-Hemodialysis Comments Pt unable to UF 3L as ordered. d/t hypotension. (Albumin 50 g IV given.)  Hemodialysis Catheter Right Internal jugular Double lumen Permanent (Tunneled)  Placement Date/Time: 09/18/22 1209   Serial / Lot #: [id number]  Expiration Date: 03/02/27  Time Out: Correct patient;Correct site;Correct procedure  Maximum sterile barrier precautions: Cap;Mask;Sterile gown;Sterile gloves;Hand hygiene;Large sterile s...  Site Condition No complications  Blue Lumen Status Heparin locked  Red Lumen Status Heparin locked  Catheter fill solution Heparin 1000 units/ml  Catheter fill volume (Arterial) 1.6 cc  Catheter fill volume (Venous) 1.6  Dressing Type Transparent  Dressing Status Antimicrobial disc in place;Clean, Dry, Intact  Interventions New dressing  Drainage Description None  Post treatment catheter status Capped and Clamped

## 2022-10-08 NOTE — Progress Notes (Signed)
ANTICOAGULATION CONSULT NOTE -  Pharmacy Consult for hepairn Indication: atrial fibrillation  Allergies  Allergen Reactions   Procaine Hcl Anaphylaxis   Amoxicillin Itching    Patient Measurements: Weight: 85 kg (187 lb 6.3 oz) Heparin Dosing Weight: 77 kg  Vital Signs: Temp: 98 F (36.7 C) (08/08 0811) Temp Source: Oral (08/08 0811) BP: 117/52 (08/08 0700) Pulse Rate: 94 (08/08 0700)  Labs: Recent Labs    10/07/22 1332 10/07/22 1529 10/07/22 1816 10/08/22 0555  HGB 8.1*  --   --  7.3*  HCT 26.3*  --   --  23.5*  PLT 278  --   --  267  APTT  --   --  43* 134*  LABPROT  --   --  25.2*  --   INR  --   --  2.3*  --   HEPARINUNFRC  --   --  >1.10* >1.10*  CREATININE  --  2.54* 2.53* 2.85*  TROPONINIHS  --  61* 73*  --     Estimated Creatinine Clearance: 18.3 mL/min (A) (by C-G formula based on SCr of 2.85 mg/dL (H)).   Medications:  Scheduled:   acetaminophen  1,000 mg Oral Q6H   amiodarone  200 mg Oral Daily   Chlorhexidine Gluconate Cloth  6 each Topical Daily   Chlorhexidine Gluconate Cloth  6 each Topical Q0600   dextrose  1 ampule Intravenous Once   heparin sodium (porcine)       insulin aspart  2-6 Units Subcutaneous Q4H   vancomycin variable dose per unstable renal function (pharmacist dosing)   Does not apply See admin instructions   Infusions:   sodium chloride Stopped (10/07/22 1346)   calcium gluconate     ceFEPime (MAXIPIME) IV     heparin     norepinephrine (LEVOPHED) Adult infusion 2 mcg/min (10/07/22 1530)    Assessment: 82 yo female presenting after cardiac arrest in dialysis. Pt on Eliquis prior to admission for atrial fibrillation. Hgb 8.1, Plt 278, no signs of bleeding noted. Baseline heparin level and aPTT ordered. Pharmacy consulted to dose heparin for atrial fibrillation.  Supratherapeutic with aPTT 134 and HL >1.1 (do not expect to correlate at this time). Discussed with patient that it was drawn on same arm as infusion however  distally. Per RN patient is tough stick and attempt was made to draw at opposite arm however unsuccessful.  Goal of Therapy:  Heparin level 0.3-0.7 units/ml aPTT 66-102 seconds Monitor platelets by anticoagulation protocol: Yes   Plan:  D/w Dr. Benito Mccreedy heparin infusion is not needed, changing back to home apixaban 2.5 mg PO BID (age 72, ESRD) for afib indication.  Rutherford Nail, PharmD PGY2 Critical Care Pharmacy Resident 10/08/2022,9:42 AM

## 2022-10-08 NOTE — Progress Notes (Signed)
[facility] Progress Note  Subjective: seen in room, no c/o's.  On HD now.   Vitals:   10/08/22 1400 10/08/22 1405 10/08/22 1430 10/08/22 1530  BP: (!) 112/57  (!) 89/66   Pulse: 100  95   Resp: (!) 21  18   Temp: 98 F (36.7 C)   97.8 F (36.6 C)  TempSrc: Oral   Oral  SpO2: 99%  100%   Weight:  82.3 kg      Exam: Gen alert, no distress, elderly lady responsive Sclera anicteric, throat clear  No jvd or bruits Chest clear bilat to bases RRR no MRG Abd soft ntnd no mass or ascites +bs Ext 2+ bilat LE edema Neuro is alert, Ox 3 , nf              Home meds include - alendronate, amiodarone, apixaban, atrovastatin, ezetimibe, insulin nph-regular, MVI, nystatin, pantoprazole, miralax, vit D, hydrocortisone cream 2.5%        OP HD: MWF SW  3.5h   400/600   78kg  2/2 bath  RIJ TDC  Heparin none - last OP HD 8/05, post wt 78.1, only 1.8kg removed - started OP HD around 7/26 - venofer 100mg  three times per week IV w hd thru 8/28     Assessment/ Plan: Cardiac arrest - in pt w/ known systolic HF and recently started on dialysis. Work up in progress per CCM. Cardiology consulting.  Volume overload - has marked volume overload and LE edema, will need dry wt lowered progressively over time. CXR showed pulm edema, not real symptomatic. Repeat CXR today no better, pt remains minimally symptomatic. Will d/w CCM.   ESRD - on HD MWF. Had HD this am required levophed support which was stopped due to vent ectopy/ VT. Will cont w/ po midodrine, hopefully this will help [place] get volume down. HD again tomorrow to get back on schedule.  BP - BP's are low here today. Outpt BP's are 100-125 sbp range.   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 Phos is low. Hold any binders for now. Follow.  HFrEF - echo showed LVEF 25-30%. BP's too low for GDMT.  Chronic atrial fib - per cards/ CCM. On IV heparin for now.  Debility /  deconditioning IDDM     Vinson Moselle MD  [facility] 10/08/2022, 4:00 PM  Recent Labs  Lab 10/07/22 1332 10/07/22 1529 10/07/22 1529 10/07/22 1816 10/08/22 0555  HGB 8.1*  --   --   --  7.3*  ALBUMIN  --  <1.5*   < > <1.5* <1.5*  CALCIUM  --  8.2*   < > 7.7* 7.9*  PHOS  --  2.1*  --   --  2.9  CREATININE  --  2.54*   < > 2.53* 2.85*  K  --  2.8*   < > 3.0* 4.2   < > = values in this interval not displayed.   No results for input(s): "IRON", "TIBC", "FERRITIN" in the last 168 hours. Inpatient medications:  acetaminophen  1,000 mg Oral Q6H   amiodarone  200 mg Oral Daily   apixaban  2.5 mg Oral BID   Chlorhexidine Gluconate Cloth  6 each Topical Daily   Chlorhexidine Gluconate Cloth  6 each Topical Q0600   dextrose  1 ampule Intravenous Once   insulin aspart  2-6 Units Subcutaneous Q4H   midodrine  5 mg Oral TID    sodium  chloride Stopped (10/07/22 1346)   calcium gluconate     norepinephrine (LEVOPHED) Adult infusion Stopped (10/08/22 1210)   docusate sodium, fentaNYL (SUBLIMAZE) injection, oxyCODONE, polyethylene glycol

## 2022-10-08 NOTE — Progress Notes (Addendum)
NAME:  [name] [name], MRN:  [id number], DOB:  05/14/1940, LOS: 1 ADMISSION DATE:  10/07/2022  History of Present Illness:  82 year old female with chronic systolic heart failure ESRD on HD, living in SNF.  She presents after cardiac arrest during outpatient HD session.  4 cycles of CPR with ROSC, no shocks or ACLS meds.  Arrives to hospital alert and oriented, on low-dose pressors, and admitted to [facility] MICU for postcardiac arrest management.  Significant Hospital Events:  8/7 admitted to [facility] MICU for pressor support after out of hospital cardiac arrest 8/8 pressors rapidly weaned on admission, restarted briefly for HD  Interim History / Subjective:  No acute events overnight.  Poor IV access. T min 94 F  Objective   Blood pressure (!) 117/52, pulse 94, temperature (!) 97.4 F (36.3 C), temperature source Oral, resp. rate 14, weight 85 kg, SpO2 100%.        Intake/Output Summary (Last 24 hours) at 10/08/2022 0740 Last data filed at 10/08/2022 0700 Gross per 24 hour  Intake 132.76 ml  Output --  Net 132.76 ml   Filed Weights   10/07/22 1729 10/08/22 0500  Weight: 79.6 kg 85 kg    Examination: No distress Heart rate is normal, rhythm is regular, radial pulses are strong, diffuse peripheral edema Breathing is regular and unlabored on room air, breath sounds are clear Chest tenderness Skin is warm and dry, site of right tunneled HD cath is clean, nonerythematous, nontender along its course Abdomen is nontender Alert and oriented  Labs: Sodium 131 Potassium 4.2 Bicarb 27 BUN/creatinine 19/2.85 WBC 21.7 => 13 Hemoglobin 8.1 => 7.3 Platelets 267 stable  D-dimer 2.12 MRSA swab negative Blood cultures negative to date  Imaging: CT head without contrast without acute intracranial abnormality CT chest with nondisplaced right second bilateral third and left fourth rib fractures, no pneumothorax, aortic atherosclerosis  Resolved Hospital Problem list   Resolved  Problems:   * No resolved hospital problems. *   Assessment & Plan:  Principal Problem:   Cardiac arrest (HCC) Active Problems:   Severe obesity (BMI >= 40) (HCC)   Diabetes (HCC)   Acute systolic CHF (congestive heart failure) (HCC)   Atrial fibrillation with rapid ventricular response (HCC)   Anemia in chronic kidney disease (CKD)   ESRD on dialysis (HCC)   Hypokalemia   Cardiac arrest, cause unspecified (HCC)  Cardiac arrest - Non shockable rhythm - 8 minutes BLS CPR with complete neurologic recovery afterwards - No ACS - D-dimer is equivocal but clinical picture not consistent with PE - Wonder if she had a perfusing rhythm with weak pulses - Aggressive electrolyte monitoring and replacement - Continue telemetry monitoring  Hypotension - Pressures on the softer side during dialysis - Well-perfused - No shock state currently - Start midodrine 5 mg p.o. 3 times daily, can support with low-dose nor epi for facilitation of dialysis - Doubt sepsis, discontinue antibiotics  Chronic systolic heart failure - No cardiogenic shock - Blood pressure too soft for GDMT - Appreciate cardiology - Volume management per HD  A-fib with RVR - RVR resolved - Continue home amiodarone 200 mg p.o. daily - DC heparin in favor of apixaban 2.5 mg p.o. twice daily - Appreciate cardiology input, no plans for cardioversion  Hyponatremia - Mild and asymptomatic - Hypervolemic, volume management per HD  Hypokalemia, resolved - Close monitoring and replacement as needed  ESRD - Off schedule HD today since she missed scheduled HD yesterday - Appreciate nephrology assistance with  this case  Chronic anemia - Hemoglobin 8.1 => 7.3 - Transfuse for hemoglobin <7 g/dL  Insulin-dependent diabetes - Start diet today - SSI  Best practice (daily eval):  Diet/type: Regular consistency (see orders) DVT prophylaxis: DOAC GI prophylaxis: N/A Lines: Dialysis Catheter Foley:  N/A Code Status:   DNR Last date of multidisciplinary goals of care discussion [10/07/22]  Marrianne Mood MD 10/08/2022, 7:40 AM  Pager: [phone]

## 2022-10-08 NOTE — Progress Notes (Signed)
Rounding Note    Patient Name: [name] [name] Date of Encounter: 10/08/2022  [facility] Cardiologist: Chilton Si, MD   Subjective   Patient admitted yesterday with "witnessed cardiac arrest".  She is alert, responsive and asymptomatic.  Inpatient Medications    Scheduled Meds:  acetaminophen  1,000 mg Oral Q6H   amiodarone  200 mg Oral Daily   Chlorhexidine Gluconate Cloth  6 each Topical Daily   Chlorhexidine Gluconate Cloth  6 each Topical Q0600   dextrose  1 ampule Intravenous Once   heparin sodium (porcine)       insulin aspart  2-6 Units Subcutaneous Q4H   vancomycin variable dose per unstable renal function (pharmacist dosing)   Does not apply See admin instructions   Continuous Infusions:  sodium chloride Stopped (10/07/22 1346)   calcium gluconate     ceFEPime (MAXIPIME) IV     heparin     norepinephrine (LEVOPHED) Adult infusion 2 mcg/min (10/07/22 1530)   PRN Meds: docusate sodium, fentaNYL (SUBLIMAZE) injection, heparin sodium (porcine), oxyCODONE, polyethylene glycol   Vital Signs    Vitals:   10/08/22 0600 10/08/22 0630 10/08/22 0700 10/08/22 0811  BP: 111/69 (!) 108/58 (!) 117/52   Pulse: 95 88 94   Resp: 15 17 14    Temp:    98 F (36.7 C)  TempSrc:    Oral  SpO2: 100% 98% 100%   Weight:        Intake/Output Summary (Last 24 hours) at 10/08/2022 0956 Last data filed at 10/08/2022 0700 Gross per 24 hour  Intake 132.76 ml  Output --  Net 132.76 ml      10/08/2022    5:00 AM 10/07/2022    5:29 PM 09/24/2022    5:00 AM  Last 3 Weights  Weight (lbs) 187 lb 6.3 oz 175 lb 7.8 oz 170 lb 6.7 oz  Weight (kg) 85 kg 79.6 kg 77.3 kg      Telemetry    Atrial fibrillation with a ventricular sponsor in the low 100 range- Personally Reviewed  ECG    Atrial fibrillation with a ventricular sponsor of 92, low limb voltage, left axis deviation- Personally Reviewed  Physical Exam   GEN: No acute distress.   Neck: No JVD Cardiac: RRR,  no murmurs, rubs, or gallops.  Respiratory: Clear to auscultation bilaterally. GI: Soft, nontender, non-distended  MS: No edema; No deformity. Neuro:  Nonfocal  Psych: Normal affect   Labs    High Sensitivity Troponin:   Recent Labs  Lab 10/07/22 1529 10/07/22 1816  TROPONINIHS 61* 73*     Chemistry Recent Labs  Lab 10/07/22 1529 10/07/22 1816 10/08/22 0555  NA 131* 130* 131*  K 2.8* 3.0* 4.2  CL 94* 92* 95*  CO2 28 27 27   GLUCOSE 62* 103* 78  BUN 15 16 19   CREATININE 2.54* 2.53* 2.85*  CALCIUM 8.2* 7.7* 7.9*  MG 1.6*  --   --   PROT 5.5* 4.8*  --   ALBUMIN <1.5* <1.5* <1.5*  AST 32 28  --   ALT 14 12  --   ALKPHOS 86 77  --   BILITOT 0.8 0.8  --   GFRNONAA 19* 19* 16*  ANIONGAP 9 11 9     Lipids No results for input(s): "CHOL", "TRIG", "HDL", "LABVLDL", "LDLCALC", "CHOLHDL" in the last 168 hours.  Hematology Recent Labs  Lab 10/07/22 1332 10/08/22 0555  WBC 21.7* 13.0*  RBC 2.84* 2.56*  HGB 8.1* 7.3*  HCT 26.3*  23.5*  MCV 92.6 91.8  MCH 28.5 28.5  MCHC 30.8 31.1  RDW 19.5* 18.3*  PLT 278 267   Thyroid No results for input(s): "TSH", "FREET4" in the last 168 hours.  BNP Recent Labs  Lab 10/07/22 1333  BNP 1,531.4*    DDimer  Recent Labs  Lab 10/07/22 1816  DDIMER 2.12*     Radiology    DG CHEST PORT 1 VIEW  Result Date: 10/08/2022 CLINICAL DATA:  Status post CPR EXAM: PORTABLE CHEST 1 VIEW COMPARISON:  Chest radiograph dated 10/07/2022 FINDINGS: Lines/tubes: Right internal jugular venous catheter tip projects over the right atrium. Lungs: Low lung volumes with increased bilateral interstitial and left-greater-than-right patchy opacities. Pleura: Small bilateral pleural effusions.  No pneumothorax. Heart/mediastinum: Similar enlarged cardiomediastinal silhouette. Surgical clips project over the upper right mediastinum. Bones: Unchanged right lateral second and third rib fractures. Additional rib fractures are better evaluated on prior CT.  IMPRESSION: 1. Low lung volumes with increased bilateral interstitial and left-greater-than-right patchy opacities, which may represent a combination of pulmonary edema, atelectasis, or superimposed aspiration/pneumonia. 2. Small bilateral pleural effusions. Electronically Signed   By: Agustin Cree M.D.   On: 10/08/2022 09:06   CT Chest Wo Contrast  Result Date: 10/07/2022 CLINICAL DATA:  Chest pain, status post cardiac arrest with CPR EXAM: CT CHEST WITHOUT CONTRAST TECHNIQUE: Multidetector CT imaging of the chest was performed following the standard protocol without IV contrast. RADIATION DOSE REDUCTION: This exam was performed according to the departmental dose-optimization program which includes automated exposure control, adjustment of the mA and/or kV according to patient size and/or use of iterative reconstruction technique. COMPARISON:  Chest radiograph dated 10/07/2022. CT chest dated 07/20/2022. FINDINGS: Cardiovascular: Cardiomegaly ventricular hypertrophy and associated wall calcification. No pericardial effusion. Hypodense blood pool relative to myocardium, suggesting anemia. No evidence of thoracic aortic aneurysm. Atherosclerotic calcifications of the aortic arch. Moderate 3 vessel coronary atherosclerosis. Right IJ dual lumen dialysis catheter terminating in the upper right atrium. Mediastinum/Nodes: No suspicious mediastinal lymphadenopathy. High density in the distal esophagus (series 3/images 91 and 120) are presumed to reflect ingested debris. Visualized thyroid is unremarkable. Lungs/Pleura: Moderate left and small right pleural effusions. Associated compressive atelectasis in the bilateral lower lobes. Mild patchy opacities in the posterior upper lobes, likely atelectasis. Technically speaking, superimposed mild multifocal aspiration is possible. Platelike scarring in the right middle lobe. Mild linear scarring/atelectasis in the lingula. Mild loculated fluid along the right major fissure (for  example, series 5/image 57). Stable 5 x 3 mm elongated nodule in the right middle lobe (series 5/image 81), benign. No follow-up is required per Fleischner Society guidelines. No pneumothorax. Upper Abdomen: Visualized upper abdomen is notable for a tiny hiatal hernia. Musculoskeletal: Nondisplaced right lateral 2nd and 3rd rib fractures. Nondisplaced left anterolateral 3rd and 4th rib fractures. These are presumably related to resuscitation. Mild superior endplate compression fracture deformity at T11, chronic. IMPRESSION: Nondisplaced right 2nd, bilateral 3rd, and left 4th rib fractures, presumably related to resuscitation. No pneumothorax. Moderate left and small right pleural effusions. Associated compressive atelectasis in the bilateral lower lobes. Superimposed mild dependent patchy opacities in the lungs bilaterally. Technically speaking, mild aspiration is possible. Additional ancillary findings as above. Aortic Atherosclerosis (ICD10-I70.0). Electronically Signed   By: Charline Bills M.D.   On: 10/07/2022 19:13   CT Head Wo Contrast  Result Date: 10/07/2022 CLINICAL DATA:  Altered mental status EXAM: CT HEAD WITHOUT CONTRAST TECHNIQUE: Contiguous axial images were obtained from the base of the skull through the vertex  without intravenous contrast. RADIATION DOSE REDUCTION: This exam was performed according to the departmental dose-optimization program which includes automated exposure control, adjustment of the mA and/or kV according to patient size and/or use of iterative reconstruction technique. COMPARISON:  MRI brain dated 09/06/2022 FINDINGS: Brain: No evidence of acute infarction, hemorrhage, hydrocephalus, extra-axial collection or mass lesion/mass effect. 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 Subcortical white matter and periventricular small vessel ischemic changes. Vascular: Intracranial atherosclerosis. Skull: Normal.  Negative for fracture or focal lesion. Sinuses/Orbits: The visualized paranasal sinuses are essentially clear. The mastoid air cells are unopacified. Other: None. IMPRESSION: No acute intracranial abnormality. Chronic left temporal encephalomalacia. Small vessel ischemic changes. Electronically Signed   By: Charline Bills M.D.   On: 10/07/2022 19:00   ECHOCARDIOGRAM COMPLETE  Result Date: 10/07/2022    ECHOCARDIOGRAM REPORT   Patient Name:   [name] [name] Date of Exam: 10/07/2022 Medical Rec #:  [id number]         Height:       69.0 in Accession #:    [id number]        Weight:       170.4 lb Date of Birth:  January 05, 1941        BSA:          1.930 m Patient Age:    81 years          BP:           125/81 mmHg Patient Gender: F                 HR:           138 bpm. Exam Location:  Inpatient Procedure: 2D Echo, Cardiac Doppler, Color Doppler and Intracardiac            Opacification Agent STAT ECHO Indications:    Cardiac Arrest  History:        Patient has prior history of Echocardiogram examinations, most                 recent 08/05/2022. CHF, CAD, CKD and Stroke, Arrythmias:Atrial                 Fibrillation; Risk Factors:Hypertension, Dyslipidemia and                 Diabetes.  Sonographer:    Wallie Char Referring Phys: [id number] Steffanie Dunn  Sonographer Comments: Image acquisition challenging due to patient body habitus. Messaged MD @ 3:15pm IMPRESSIONS  1. Left ventricular ejection fraction, by estimation, is 25 to 30%. The left ventricle has severely decreased function. The left ventricle demonstrates global hypokinesis. Left ventricular diastolic function could not be evaluated.  2. Right ventricular systolic function was not well visualized. The right ventricular size is normal. There is moderately elevated pulmonary artery systolic pressure. The estimated right ventricular systolic pressure is 57.6 mmHg.  3. The mitral valve is degenerative. Trivial mitral valve regurgitation. No evidence of mitral  stenosis. Moderate mitral annular calcification.  4. The aortic valve is tricuspid. Aortic valve regurgitation is not visualized. Aortic valve sclerosis/calcification is present, without any evidence of aortic stenosis. Aortic valve area, by VTI measures 2.52 cm. Aortic valve mean gradient measures 3.0 mmHg. Aortic valve Vmax measures 1.06 m/s.  5. The inferior vena cava is dilated in size with >50% respiratory variability, suggesting right atrial pressure of 8 mmHg. FINDINGS  Left Ventricle: Left ventricular ejection fraction, by estimation, is 25 to 30%.  The left ventricle has severely decreased function. The left ventricle demonstrates global hypokinesis. Definity contrast agent was given IV to delineate the left ventricular endocardial borders. The left ventricular internal cavity size was normal in size. There is no left ventricular hypertrophy. Left ventricular diastolic function could not be evaluated due to atrial fibrillation. Left ventricular diastolic function could not be evaluated. Right Ventricle: The right ventricular size is normal. No increase in right ventricular wall thickness. Right ventricular systolic function was not well visualized. There is moderately elevated pulmonary artery systolic pressure. The tricuspid regurgitant velocity is 3.52 m/s, and with an assumed right atrial pressure of 8 mmHg, the estimated right ventricular systolic pressure is 57.6 mmHg. Left Atrium: Left atrial size was normal in size. Right Atrium: Right atrial size was normal in size. Pericardium: There is no evidence of pericardial effusion. Mitral Valve: The mitral valve is degenerative in appearance. There is moderate thickening of the mitral valve leaflet(s). There is moderate calcification of the mitral valve leaflet(s). Moderate mitral annular calcification. Trivial mitral valve regurgitation. No evidence of mitral valve stenosis. MV peak gradient, 5.3 mmHg. The mean mitral valve gradient is 3.0 mmHg. Tricuspid  Valve: The tricuspid valve is normal in structure. Tricuspid valve regurgitation is mild . No evidence of tricuspid stenosis. Aortic Valve: The aortic valve is tricuspid. Aortic valve regurgitation is not visualized. Aortic valve sclerosis/calcification is present, without any evidence of aortic stenosis. Aortic valve mean gradient measures 3.0 mmHg. Aortic valve peak gradient measures 4.5 mmHg. Aortic valve area, by VTI measures 2.52 cm. Pulmonic Valve: The pulmonic valve was normal in structure. Pulmonic valve regurgitation is not visualized. No evidence of pulmonic stenosis. Aorta: The aortic root is normal in size and structure. Venous: The inferior vena cava is dilated in size with greater than 50% respiratory variability, suggesting right atrial pressure of 8 mmHg. IAS/Shunts: No atrial level shunt detected by color flow Doppler.  LEFT VENTRICLE PLAX 2D LVIDd:         4.10 cm      Diastology LVIDs:         3.80 cm      LV e' medial:  5.80 cm/s LV PW:         0.80 cm      LV e' lateral: 11.40 cm/s LV IVS:        0.80 cm LVOT diam:     1.90 cm LV SV:         46 LV SV Index:   24 LVOT Area:     2.84 cm  LV Volumes (MOD) LV vol d, MOD A2C: 142.0 ml LV vol d, MOD A4C: 83.1 ml LV vol s, MOD A2C: 91.6 ml LV vol s, MOD A4C: 58.4 ml LV SV MOD A2C:     50.4 ml LV SV MOD A4C:     83.1 ml LV SV MOD BP:      37.1 ml RIGHT VENTRICLE             IVC RV Basal diam:  3.10 cm     IVC diam: 2.10 cm RV S prime:     13.20 cm/s TAPSE (M-mode): 1.1 cm LEFT ATRIUM             Index        RIGHT ATRIUM           Index LA diam:        3.50 cm 1.81 cm/m   RA Area:     11.30  cm LA Vol (A2C):   37.9 ml 19.64 ml/m  RA Volume:   20.20 ml  10.47 ml/m LA Vol (A4C):   52.8 ml 27.36 ml/m LA Biplane Vol: 47.2 ml 24.45 ml/m  AORTIC VALVE AV Area (Vmax):    2.70 cm AV Area (Vmean):   2.17 cm AV Area (VTI):     2.52 cm AV Vmax:           105.50 cm/s AV Vmean:          84.950 cm/s AV VTI:            0.182 m AV Peak Grad:      4.5 mmHg AV  Mean Grad:      3.0 mmHg LVOT Vmax:         100.40 cm/s LVOT Vmean:        65.100 cm/s LVOT VTI:          0.161 m LVOT/AV VTI ratio: 0.89  AORTA Ao Root diam: 3.30 cm Ao Asc diam:  3.30 cm MITRAL VALVE              TRICUSPID VALVE MV Area (PHT): 5.64 cm   TV Peak grad:   54.5 mmHg MV Area VTI:   3.52 cm   TV Vmax:        3.69 m/s MV Peak grad:  5.3 mmHg   TR Peak grad:   49.6 mmHg MV Mean grad:  3.0 mmHg   TR Vmax:        352.00 cm/s MV Vmax:       1.15 m/s MV Vmean:      78.2 cm/s  SHUNTS                           Systemic VTI:  0.16 m                           Systemic Diam: 1.90 cm Armanda Magic MD Electronically signed by Armanda Magic MD Signature Date/Time: 10/07/2022/3:33:15 PM    Final    DG Chest Portable 1 View  Result Date: 10/07/2022 CLINICAL DATA:  chest pain s/p CPR cardiac arrest EXAM: PORTABLE CHEST 1 VIEW COMPARISON:  09/11/2022. FINDINGS: There are findings suggestive of congestive heart failure/pulmonary edema, grossly similar to the prior study. Redemonstration of left retrocardiac airspace opacity obscuring the left hemidiaphragm, descending thoracic aorta and blunting the left lateral costophrenic angle suggesting combination of left lower lobe atelectasis and/or consolidation with pleural effusion. No significant interval change. Right lateral costophrenic angle is clear. Stable cardio-mediastinal silhouette. No acute osseous abnormalities. The soft tissues are within normal limits. Right IJ hemodialysis catheter noted with its tip overlying the cavoatrial junction region. IMPRESSION: 1. Congestive heart failure/pulmonary edema. 2. Left lower lobe atelectasis and/or consolidation with pleural effusion. Electronically Signed   By: Jules Schick M.D.   On: 10/07/2022 14:39    Cardiac Studies   2D echo (10/07/2022)  IMPRESSIONS     1. Left ventricular ejection fraction, by estimation, is 25 to 30%. The  left ventricle has severely decreased function. The left ventricle  demonstrates  global hypokinesis. Left ventricular diastolic function could  not be evaluated.   2. Right ventricular systolic function was not well visualized. The right  ventricular size is normal. There is moderately elevated pulmonary artery  systolic pressure. The estimated right ventricular systolic pressure is  57.6 mmHg.   3. The  mitral valve is degenerative. Trivial mitral valve regurgitation.  No evidence of mitral stenosis. Moderate mitral annular calcification.   4. The aortic valve is tricuspid. Aortic valve regurgitation is not  visualized. Aortic valve sclerosis/calcification is present, without any  evidence of aortic stenosis. Aortic valve area, by VTI measures 2.52 cm.  Aortic valve mean gradient measures  3.0 mmHg. Aortic valve Vmax measures 1.06 m/s.   5. The inferior vena cava is dilated in size with >50% respiratory  variability, suggesting right atrial pressure of 8 mmHg.   Patient Profile     Estalene Kalu Murrie is a 82 y.o. female with a hx of LV dysfunction, PAF and CAD, hypertension, type 2 diabetes, who is being seen 10/07/2022 for the evaluation of witnessed cardiac arrest at the request of Dr. Chestine Spore.   Assessment & Plan    1: Cardiac arrest-etiology unclear.  She does have severe LV dysfunction.  She had ROSC after 8 minutes of CPR.  She is currently pain-free.  She is in A-fib with mildly elevated ventricular spots.  There were no other ventricular arrhythmias.  Her electrolyte abnormalities have been replaced.  2.  A-fib-back in A-fib after cardioversion 2 months ago by Dr. Duke Salvia.  She was on Eliquis, currently on heparin.  Plan will be for rate control.  No plans for cardioversion.  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  EF 25 to 30% by 2D echo.  Blood pressure is too soft for GDMT.  Patient apparently is going to be transferred out of ICU.  Will require dialysis.  Blood pressure may be too low  for this.  No further evaluation by cardiac service during his hospitalization.  Will arrange outpatient follow-up with Dr. Brynda Greathouse. [facility] will sign off.   Medication Recommendations: Eliquis, amiodarone  Other recommendations (labs, testing, etc): None Follow up as an outpatient: Outpatient follow-up to be arranged with Dr. Duke Salvia.  For questions or updates, please contact [facility] Please consult [email or web address] for contact info under        Signed, Nanetta Batty, MD  10/08/2022, 9:56 AM

## 2022-10-09 DIAGNOSIS — N186 End stage renal disease: Secondary | ICD-10-CM | POA: Diagnosis not present

## 2022-10-09 DIAGNOSIS — E8809 Other disorders of plasma-protein metabolism, not elsewhere classified: Secondary | ICD-10-CM | POA: Diagnosis not present

## 2022-10-09 DIAGNOSIS — I5021 Acute systolic (congestive) heart failure: Secondary | ICD-10-CM | POA: Diagnosis not present

## 2022-10-09 DIAGNOSIS — I469 Cardiac arrest, cause unspecified: Secondary | ICD-10-CM | POA: Diagnosis not present

## 2022-10-09 LAB — TYPE AND SCREEN
ABO/RH(D): A NEG
Antibody Screen: NEGATIVE
Unit division: 0

## 2022-10-09 LAB — FERRITIN: Ferritin: 269 ng/mL (ref 11–307)

## 2022-10-09 LAB — RETICULOCYTES
Immature Retic Fract: 46.6 % — ABNORMAL HIGH (ref 2.3–15.9)
RBC.: 2.21 MIL/uL — ABNORMAL LOW (ref 3.87–5.11)
Retic Count, Absolute: 83.3 10*3/uL (ref 19.0–186.0)
Retic Ct Pct: 3.8 % — ABNORMAL HIGH (ref 0.4–3.1)

## 2022-10-09 LAB — RENAL FUNCTION PANEL
Albumin: 2 g/dL — ABNORMAL LOW (ref 3.5–5.0)
Anion gap: 10 (ref 5–15)
BUN: 13 mg/dL (ref 8–23)
CO2: 26 mmol/L (ref 22–32)
Calcium: 7.8 mg/dL — ABNORMAL LOW (ref 8.9–10.3)
Chloride: 94 mmol/L — ABNORMAL LOW (ref 98–111)
Creatinine, Ser: 1.96 mg/dL — ABNORMAL HIGH (ref 0.44–1.00)
GFR, Estimated: 25 mL/min — ABNORMAL LOW (ref >60)
Glucose, Bld: 98 mg/dL (ref 70–99)
Phosphorus: 2.3 mg/dL — ABNORMAL LOW (ref 2.5–4.6)
Potassium: 3.5 mmol/L (ref 3.5–5.1)
Sodium: 130 mmol/L — ABNORMAL LOW (ref 135–145)

## 2022-10-09 LAB — FOLATE: Folate: 12 ng/mL (ref >5.9)

## 2022-10-09 LAB — IRON AND TIBC
Iron: 17 ug/dL — ABNORMAL LOW (ref 28–170)
Saturation Ratios: 14 % (ref 10.4–31.8)
TIBC: 119 ug/dL — ABNORMAL LOW (ref 250–450)
UIBC: 102 ug/dL

## 2022-10-09 LAB — GLUCOSE, CAPILLARY
Glucose-Capillary: 93 mg/dL (ref 70–99)
Glucose-Capillary: 102 mg/dL — ABNORMAL HIGH (ref 70–99)
Glucose-Capillary: 151 mg/dL — ABNORMAL HIGH (ref 70–99)
Glucose-Capillary: 236 mg/dL — ABNORMAL HIGH (ref 70–99)
Glucose-Capillary: 172 mg/dL — ABNORMAL HIGH (ref 70–99)

## 2022-10-09 LAB — HEMOGLOBIN AND HEMATOCRIT, BLOOD
HCT: 28.4 % — ABNORMAL LOW (ref 36.0–46.0)
Hemoglobin: 9.1 g/dL — ABNORMAL LOW (ref 12.0–15.0)

## 2022-10-09 LAB — BPAM RBC
Blood Product Expiration Date: 202408162359
ISSUE DATE / TIME: 202408090649
Unit Type and Rh: 600

## 2022-10-09 LAB — VITAMIN B12: Vitamin B-12: 744 pg/mL (ref 180–914)

## 2022-10-09 LAB — PREPARE RBC (CROSSMATCH)

## 2022-10-09 MED ORDER — CHLORHEXIDINE GLUCONATE CLOTH 2 % EX PADS
6.0000 | MEDICATED_PAD | Freq: Every day | CUTANEOUS | Status: DC
  Administered 2022-10-10 – 2022-10-16 (×3): 6 via TOPICAL

## 2022-10-09 MED ORDER — ACETAMINOPHEN 325 MG PO TABS
650.0000 mg | ORAL_TABLET | Freq: Four times a day (QID) | ORAL | Status: DC | PRN
  Administered 2022-10-10 – 2022-10-12 (×4): 650 mg via ORAL
  Filled 2022-10-09 (×4): qty 2

## 2022-10-09 MED ORDER — MAGNESIUM SULFATE IN D5W 1-5 GM/100ML-% IV SOLN
1.0000 g | Freq: Once | INTRAVENOUS | Status: AC
  Administered 2022-10-09: 1 g via INTRAVENOUS
  Filled 2022-10-09: qty 100

## 2022-10-09 MED ORDER — SODIUM CHLORIDE 0.9% IV SOLUTION: Freq: Once | INTRAVENOUS | Status: DC

## 2022-10-09 MED ORDER — K PHOS MONO-SOD PHOS DI & MONO 155-852-130 MG PO TABS
500.0000 mg | ORAL_TABLET | Freq: Two times a day (BID) | ORAL | Status: AC
  Administered 2022-10-09 (×2): 500 mg via ORAL
  Filled 2022-10-09 (×2): qty 2

## 2022-10-09 MED ORDER — POTASSIUM CHLORIDE CRYS ER 20 MEQ PO TBCR
20.0000 meq | EXTENDED_RELEASE_TABLET | Freq: Once | ORAL | Status: AC
  Administered 2022-10-09: 20 meq via ORAL
  Filled 2022-10-09: qty 1

## 2022-10-09 NOTE — Progress Notes (Signed)
eLink Physician-Brief Progress Note Patient Name: [name] [name] DOB: 1940/12/06 MRN: [id number]   Date of Service  10/09/2022  HPI/Events of Note  ESRD with Anemia. Hg 6.4  eICU Interventions  1upRBC     Intervention Category Intermediate Interventions: Bleeding - evaluation and treatment with blood products    10/09/2022, 3:08 AM

## 2022-10-09 NOTE — Progress Notes (Signed)
PROGRESS NOTE    [name] [name]  GEX:[id number] DOB: 1941/01/14 DOA: 10/07/2022 PCP: [name], MD  81/F with chronic systolic CHF, ESRD recently started on hemodialysis last month, P atrial fibrillation, diabetes, CVA.  Recently residing at SNF/rehab.  She experienced a syncopal episode versus cardiac arrest while she was initiating hemodialysis on 8/7.  She received 8 minutes of CPR, no epinephrine.  Was not intubated, CT head reassuring, CT chest noted nondisplaced multiple rib fractures  transiently required norepinephrine, now off. -Echo with cardiomyopathy, chronic systolic CHF, largely unchanged, cards signed off -Transferred from PCCM to [facility] service today 8/9   Subjective: -Feels fair, no events overnight, some soreness in her chest at the site of CPR and rib fractures  Assessment and Plan:  Cardiac arrest -Unclear etiology, it is possible that she had a syncopal event from hypotension on dialysis, achieved ROSC after 8 minutes of CPR -Remains in A-fib, currently on amiodarone and Eliquis -2D echo noted EF of 25-30%, moderately elevated PA pressures, RV not well-visualized -Previous echo noted EF of 35% in 6/24 -Cardiology consulted, no further workup recommended at this time, recommended follow-up with Dr. Duke Salvia  Chronic hypotension -Multifactorial in the setting of ESRD, systolic CHF and severe hypoalbuminemia -Off pressors, do not suspect sepsis, was taken off antibiotics in ICU yesterday -Was transiently on Levophed, now off -Continue midodrine  Volume overload ESRD Severe hypoalbuminemia -Remains significantly volume overloaded however hypotension limits aggressive volume removal -Also has cardiomyopathy -Nephrology following, continue HD as tolerated, received multiple doses of albumin this admission -Albumin less than 1.5, prognosis is poor -Will request palliative care eval as well  Chronic systolic heart failure - No cardiogenic shock - Blood pressure  too soft for GDMT - Seen by cardiology, volume management with HD   A-fib with RVR - RVR resolved - Continue amiodarone, apixaban, no plans for cardioversion per cardiology team   Hyponatremia - Mild and asymptomatic - Hypervolemic, volume management per HD   Hypokalemia, resolved - Close monitoring and replacement as needed   Chronic anemia - Hemoglobin 8.1 => 7.3 >6.4 -Patient denies overt bleeding, suspect hemodilution is contributing as well, check Hemoccults, transfusing PRBC, check anemia panel -Discontinue Eliquis if she has evidence of active bleeding   Insulin-dependent diabetes - Start diet today - SSI  Debility, frailty -Recently started on dialysis with cardiomyopathy and severe hypoalbuminemia -Hypotension limiting dialysis to some extent -Palliative consulted -PT OT eval   DVT prophylaxis:apixaban Code Status: DNR Family Communication: No family at bedside Disposition Plan: Likely back to SNF  Consultants:    Procedures:   Antimicrobials:    Objective: Vitals:   10/09/22 0800 10/09/22 0826 10/09/22 0900 10/09/22 0935  BP: (!) 142/79  131/77 139/82  Pulse: 80  99 95  Resp: (!) 7  11 10   Temp:  (!) 96.5 F (35.8 C)  (!) 96.7 F (35.9 C)  TempSrc:  Axillary  Oral  SpO2: 98%  95% 90%  Weight:        Intake/Output Summary (Last 24 hours) at 10/09/2022 0956 Last data filed at 10/09/2022 0935 Gross per 24 hour  Intake 685.86 ml  Output 2700 ml  Net -2014.14 ml   Filed Weights   10/08/22 1028 10/08/22 1405 10/09/22 0500  Weight: 85 kg 82.3 kg 80.2 kg    Examination:  General exam: Chronically ill female appears older than stated age, AAOx3 HEENT: Positive JVD, right IJ HD catheter CVS: S1-S2, irregular rhythm Lungs: Decreased breath sounds to bases Abdomen: Soft,  nontender, bowel sounds present Extremities: 2-3+ edema in upper and lower extremities Psychiatry:  Mood & affect appropriate.     Data Reviewed:   CBC: Recent Labs  Lab  10/07/22 1332 10/08/22 0555 10/09/22 0224  WBC 21.7* 13.0* 11.9*  NEUTROABS 16.0*  --   --   HGB 8.1* 7.3* 6.4*  HCT 26.3* 23.5* 20.4*  MCV 92.6 91.8 93.6  PLT 278 267 273   Basic Metabolic Panel: Recent Labs  Lab 10/07/22 1529 10/07/22 1816 10/08/22 0555 10/09/22 0224  NA 131* 130* 131* 130*  K 2.8* 3.0* 4.2 3.5  CL 94* 92* 95* 94*  CO2 28 27 27 26   GLUCOSE 62* 103* 78 98  BUN 15 16 19 13   CREATININE 2.54* 2.53* 2.85* 1.96*  CALCIUM 8.2* 7.7* 7.9* 7.8*  MG 1.6*  --  1.8 1.9  PHOS 2.1*  --  2.9 2.3*   GFR: Estimated Creatinine Clearance: 25.9 mL/min (A) (by C-G formula based on SCr of 1.96 mg/dL (H)). Liver Function Tests: Recent Labs  Lab 10/07/22 1529 10/07/22 1816 10/08/22 0555 10/09/22 0224  AST 32 28  --   --   ALT 14 12  --   --   ALKPHOS 86 77  --   --   BILITOT 0.8 0.8  --   --   PROT 5.5* 4.8*  --   --   ALBUMIN <1.5* <1.5* <1.5* 2.0*   No results for input(s): "LIPASE", "AMYLASE" in the last 168 hours. No results for input(s): "AMMONIA" in the last 168 hours. Coagulation Profile: Recent Labs  Lab 10/07/22 1816  INR 2.3*   Cardiac Enzymes: No results for input(s): "CKTOTAL", "CKMB", "CKMBINDEX", "TROPONINI" in the last 168 hours. BNP (last 3 results) No results for input(s): "PROBNP" in the last 8760 hours. HbA1C: No results for input(s): "HGBA1C" in the last 72 hours. CBG: Recent Labs  Lab 10/08/22 1529 10/08/22 1928 10/08/22 2337 10/09/22 0328 10/09/22 0823  GLUCAP 130* 144* 101* 93 102*   Lipid Profile: No results for input(s): "CHOL", "HDL", "LDLCALC", "TRIG", "CHOLHDL", "LDLDIRECT" in the last 72 hours. Thyroid Function Tests: No results for input(s): "TSH", "T4TOTAL", "FREET4", "T3FREE", "THYROIDAB" in the last 72 hours. Anemia Panel: Recent Labs    10/09/22 0223  RETICCTPCT 3.8*   Urine analysis:    Component Value Date/Time   COLORURINE YELLOW 09/06/2022 1140   APPEARANCEUR TURBID (A) 09/06/2022 1140   LABSPEC 1.011  09/06/2022 1140   PHURINE 5.0 09/06/2022 1140   GLUCOSEU >=500 (A) 09/06/2022 1140   GLUCOSEU NEGATIVE 05/06/2022 1103   HGBUR LARGE (A) 09/06/2022 1140   BILIRUBINUR NEGATIVE 09/06/2022 1140   BILIRUBINUR neg 05/16/2015 1928   KETONESUR NEGATIVE 09/06/2022 1140   PROTEINUR 100 (A) 09/06/2022 1140   UROBILINOGEN 0.2 05/06/2022 1103   NITRITE NEGATIVE 09/06/2022 1140   LEUKOCYTESUR LARGE (A) 09/06/2022 1140   Sepsis Labs: @LABRCNTIP (procalcitonin:4,lacticidven:4)  ) Recent Results (from the past 240 hour(s))  Blood culture (routine x 2)     Status: None (Preliminary result)   Collection Time: 10/07/22  3:29 PM   Specimen: BLOOD RIGHT ARM  Result Value Ref Range Status   Specimen Description BLOOD RIGHT ARM  Final   Special Requests NONE  Final   Culture   Final    NO GROWTH 2 DAYS Performed at [facility], [address] [place], [place] [address]    Report Status PENDING  Incomplete  MRSA Next Gen by PCR, Nasal     Status: None  Collection Time: 10/07/22  5:19 PM   Specimen: Nasal Mucosa; Nasal Swab  Result Value Ref Range Status   MRSA by PCR Next Gen NOT DETECTED NOT DETECTED Final    Comment: (NOTE) The GeneXpert MRSA Assay (FDA approved for NASAL specimens only), is one component of a comprehensive MRSA colonization surveillance program. It is not intended to diagnose MRSA infection nor to guide or monitor treatment for MRSA infections. Test performance is not FDA approved in patients less than 77 years old. Performed at [facility], [address] [place], [place] [address]      Radiology Studies: DG CHEST PORT 1 VIEW  Result Date: 10/08/2022 CLINICAL DATA:  Status post CPR EXAM: PORTABLE CHEST 1 VIEW COMPARISON:  Chest radiograph dated 10/07/2022 FINDINGS: Lines/tubes: Right internal jugular venous catheter tip projects over the right atrium. Lungs: Low lung volumes with increased bilateral interstitial and left-greater-than-right patchy  opacities. Pleura: Small bilateral pleural effusions.  No pneumothorax. Heart/mediastinum: Similar enlarged cardiomediastinal silhouette. Surgical clips project over the upper right mediastinum. Bones: Unchanged right lateral second and third rib fractures. Additional rib fractures are better evaluated on prior CT. IMPRESSION: 1. Low lung volumes with increased bilateral interstitial and left-greater-than-right patchy opacities, which may represent a combination of pulmonary edema, atelectasis, or superimposed aspiration/pneumonia. 2. Small bilateral pleural effusions. Electronically Signed   By: Agustin Cree M.D.   On: 10/08/2022 09:06   CT Chest Wo Contrast  Result Date: 10/07/2022 CLINICAL DATA:  Chest pain, status post cardiac arrest with CPR EXAM: CT CHEST WITHOUT CONTRAST TECHNIQUE: Multidetector CT imaging of the chest was performed following the standard protocol without IV contrast. RADIATION DOSE REDUCTION: This exam was performed according to the departmental dose-optimization program which includes automated exposure control, adjustment of the mA and/or kV according to patient size and/or use of iterative reconstruction technique. COMPARISON:  Chest radiograph dated 10/07/2022. CT chest dated 07/20/2022. FINDINGS: Cardiovascular: Cardiomegaly ventricular hypertrophy and associated wall calcification. No pericardial effusion. Hypodense blood pool relative to myocardium, suggesting anemia. No evidence of thoracic aortic aneurysm. Atherosclerotic calcifications of the aortic arch. Moderate 3 vessel coronary atherosclerosis. Right IJ dual lumen dialysis catheter terminating in the upper right atrium. Mediastinum/Nodes: No suspicious mediastinal lymphadenopathy. High density in the distal esophagus (series 3/images 91 and 120) are presumed to reflect ingested debris. Visualized thyroid is unremarkable. Lungs/Pleura: Moderate left and small right pleural effusions. Associated compressive atelectasis in the  bilateral lower lobes. Mild patchy opacities in the posterior upper lobes, likely atelectasis. Technically speaking, superimposed mild multifocal aspiration is possible. Platelike scarring in the right middle lobe. Mild linear scarring/atelectasis in the lingula. Mild loculated fluid along the right major fissure (for example, series 5/image 57). Stable 5 x 3 mm elongated nodule in the right middle lobe (series 5/image 81), benign. No follow-up is required per Fleischner Society guidelines. No pneumothorax. Upper Abdomen: Visualized upper abdomen is notable for a tiny hiatal hernia. Musculoskeletal: Nondisplaced right lateral 2nd and 3rd rib fractures. Nondisplaced left anterolateral 3rd and 4th rib fractures. These are presumably related to resuscitation. Mild superior endplate compression fracture deformity at T11, chronic. IMPRESSION: Nondisplaced right 2nd, bilateral 3rd, and left 4th rib fractures, presumably related to resuscitation. No pneumothorax. Moderate left and small right pleural effusions. Associated compressive atelectasis in the bilateral lower lobes. Superimposed mild dependent patchy opacities in the lungs bilaterally. Technically speaking, mild aspiration is possible. Additional ancillary findings as above. Aortic Atherosclerosis (ICD10-I70.0). Electronically Signed   By: Charline Bills M.D.   On:  10/07/2022 19:13   CT Head Wo Contrast  Result Date: 10/07/2022 CLINICAL DATA:  Altered mental status EXAM: CT HEAD WITHOUT CONTRAST TECHNIQUE: Contiguous axial images were obtained from the base of the skull through the vertex without intravenous contrast. RADIATION DOSE REDUCTION: This exam was performed according to the departmental dose-optimization program which includes automated exposure control, adjustment of the mA and/or kV according to patient size and/or use of iterative reconstruction technique. COMPARISON:  MRI brain dated 09/06/2022 FINDINGS: Brain: No evidence of acute infarction,  hemorrhage, hydrocephalus, extra-axial collection or mass lesion/mass effect. 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 Subcortical white matter and periventricular small vessel ischemic changes. Vascular: Intracranial atherosclerosis. Skull: Normal. Negative for fracture or focal lesion. Sinuses/Orbits: The visualized paranasal sinuses are essentially clear. The mastoid air cells are unopacified. Other: None. IMPRESSION: No acute intracranial abnormality. Chronic left temporal encephalomalacia. Small vessel ischemic changes. Electronically Signed   By: Charline Bills M.D.   On: 10/07/2022 19:00   ECHOCARDIOGRAM COMPLETE  Result Date: 10/07/2022    ECHOCARDIOGRAM REPORT   Patient Name:   [name] [name] Date of Exam: 10/07/2022 Medical Rec #:  [id number]         Height:       69.0 in Accession #:    [id number]        Weight:       170.4 lb Date of Birth:  1940/09/06        BSA:          1.930 m Patient Age:    81 years          BP:           125/81 mmHg Patient Gender: F                 HR:           138 bpm. Exam Location:  Inpatient Procedure: 2D Echo, Cardiac Doppler, Color Doppler and Intracardiac            Opacification Agent STAT ECHO Indications:    Cardiac Arrest  History:        Patient has prior history of Echocardiogram examinations, most                 recent 08/05/2022. CHF, CAD, CKD and Stroke, Arrythmias:Atrial                 Fibrillation; Risk Factors:Hypertension, Dyslipidemia and                 Diabetes.  Sonographer:    Wallie Char Referring Phys: [id number] Steffanie Dunn  Sonographer Comments: Image acquisition challenging due to patient body habitus. Messaged MD @ 3:15pm IMPRESSIONS  1. Left ventricular ejection fraction, by estimation, is 25 to 30%. The left ventricle has severely decreased function. The left ventricle demonstrates global hypokinesis. Left ventricular diastolic function could not be evaluated.  2. Right  ventricular systolic function was not well visualized. The right ventricular size is normal. There is moderately elevated pulmonary artery systolic pressure. The estimated right ventricular systolic pressure is 57.6 mmHg.  3. The mitral valve is degenerative. Trivial mitral valve regurgitation. No evidence of mitral stenosis. Moderate mitral annular calcification.  4. The aortic valve is tricuspid. Aortic valve regurgitation is not visualized. Aortic valve sclerosis/calcification is present, without any evidence of aortic stenosis. Aortic valve area, by VTI measures 2.52 cm. Aortic valve mean gradient measures 3.0 mmHg. Aortic valve Vmax  measures 1.06 m/s.  5. The inferior vena cava is dilated in size with >50% respiratory variability, suggesting right atrial pressure of 8 mmHg. FINDINGS  Left Ventricle: Left ventricular ejection fraction, by estimation, is 25 to 30%. The left ventricle has severely decreased function. The left ventricle demonstrates global hypokinesis. Definity contrast agent was given IV to delineate the left ventricular endocardial borders. The left ventricular internal cavity size was normal in size. There is no left ventricular hypertrophy. Left ventricular diastolic function could not be evaluated due to atrial fibrillation. Left ventricular diastolic function could not be evaluated. Right Ventricle: The right ventricular size is normal. No increase in right ventricular wall thickness. Right ventricular systolic function was not well visualized. There is moderately elevated pulmonary artery systolic pressure. The tricuspid regurgitant velocity is 3.52 m/s, and with an assumed right atrial pressure of 8 mmHg, the estimated right ventricular systolic pressure is 57.6 mmHg. Left Atrium: Left atrial size was normal in size. Right Atrium: Right atrial size was normal in size. Pericardium: There is no evidence of pericardial effusion. Mitral Valve: The mitral valve is degenerative in appearance.  There is moderate thickening of the mitral valve leaflet(s). There is moderate calcification of the mitral valve leaflet(s). Moderate mitral annular calcification. Trivial mitral valve regurgitation. No evidence of mitral valve stenosis. MV peak gradient, 5.3 mmHg. The mean mitral valve gradient is 3.0 mmHg. Tricuspid Valve: The tricuspid valve is normal in structure. Tricuspid valve regurgitation is mild . No evidence of tricuspid stenosis. Aortic Valve: The aortic valve is tricuspid. Aortic valve regurgitation is not visualized. Aortic valve sclerosis/calcification is present, without any evidence of aortic stenosis. Aortic valve mean gradient measures 3.0 mmHg. Aortic valve peak gradient measures 4.5 mmHg. Aortic valve area, by VTI measures 2.52 cm. Pulmonic Valve: The pulmonic valve was normal in structure. Pulmonic valve regurgitation is not visualized. No evidence of pulmonic stenosis. Aorta: The aortic root is normal in size and structure. Venous: The inferior vena cava is dilated in size with greater than 50% respiratory variability, suggesting right atrial pressure of 8 mmHg. IAS/Shunts: No atrial level shunt detected by color flow Doppler.  LEFT VENTRICLE PLAX 2D LVIDd:         4.10 cm      Diastology LVIDs:         3.80 cm      LV e' medial:  5.80 cm/s LV PW:         0.80 cm      LV e' lateral: 11.40 cm/s LV IVS:        0.80 cm LVOT diam:     1.90 cm LV SV:         46 LV SV Index:   24 LVOT Area:     2.84 cm  LV Volumes (MOD) LV vol d, MOD A2C: 142.0 ml LV vol d, MOD A4C: 83.1 ml LV vol s, MOD A2C: 91.6 ml LV vol s, MOD A4C: 58.4 ml LV SV MOD A2C:     50.4 ml LV SV MOD A4C:     83.1 ml LV SV MOD BP:      37.1 ml RIGHT VENTRICLE             IVC RV Basal diam:  3.10 cm     IVC diam: 2.10 cm RV S prime:     13.20 cm/s TAPSE (M-mode): 1.1 cm LEFT ATRIUM             Index  RIGHT ATRIUM           Index LA diam:        3.50 cm 1.81 cm/m   RA Area:     11.30 cm LA Vol (A2C):   37.9 ml 19.64 ml/m  RA  Volume:   20.20 ml  10.47 ml/m LA Vol (A4C):   52.8 ml 27.36 ml/m LA Biplane Vol: 47.2 ml 24.45 ml/m  AORTIC VALVE AV Area (Vmax):    2.70 cm AV Area (Vmean):   2.17 cm AV Area (VTI):     2.52 cm AV Vmax:           105.50 cm/s AV Vmean:          84.950 cm/s AV VTI:            0.182 m AV Peak Grad:      4.5 mmHg AV Mean Grad:      3.0 mmHg LVOT Vmax:         100.40 cm/s LVOT Vmean:        65.100 cm/s LVOT VTI:          0.161 m LVOT/AV VTI ratio: 0.89  AORTA Ao Root diam: 3.30 cm Ao Asc diam:  3.30 cm MITRAL VALVE              TRICUSPID VALVE MV Area (PHT): 5.64 cm   TV Peak grad:   54.5 mmHg MV Area VTI:   3.52 cm   TV Vmax:        3.69 m/s MV Peak grad:  5.3 mmHg   TR Peak grad:   49.6 mmHg MV Mean grad:  3.0 mmHg   TR Vmax:        352.00 cm/s MV Vmax:       1.15 m/s MV Vmean:      78.2 cm/s  SHUNTS                           Systemic VTI:  0.16 m                           Systemic Diam: 1.90 cm Armanda Magic MD Electronically signed by Armanda Magic MD Signature Date/Time: 10/07/2022/3:33:15 PM    Final    DG Chest Portable 1 View  Result Date: 10/07/2022 CLINICAL DATA:  chest pain s/p CPR cardiac arrest EXAM: PORTABLE CHEST 1 VIEW COMPARISON:  09/11/2022. FINDINGS: There are findings suggestive of congestive heart failure/pulmonary edema, grossly similar to the prior study. Redemonstration of left retrocardiac airspace opacity obscuring the left hemidiaphragm, descending thoracic aorta and blunting the left lateral costophrenic angle suggesting combination of left lower lobe atelectasis and/or consolidation with pleural effusion. No significant interval change. Right lateral costophrenic angle is clear. Stable cardio-mediastinal silhouette. No acute osseous abnormalities. The soft tissues are within normal limits. Right IJ hemodialysis catheter noted with its tip overlying the cavoatrial junction region. IMPRESSION: 1. Congestive heart failure/pulmonary edema. 2. Left lower lobe atelectasis and/or  consolidation with pleural effusion. Electronically Signed   By: Jules Schick M.D.   On: 10/07/2022 14:39     Scheduled Meds:  sodium chloride   Intravenous Once   acetaminophen  1,000 mg Oral Q6H   amiodarone  200 mg Oral Daily   apixaban  2.5 mg Oral BID   Chlorhexidine Gluconate Cloth  6 each Topical Daily   Chlorhexidine Gluconate Cloth  6 each Topical Q0600  Chlorhexidine Gluconate Cloth  6 each Topical Q0600   dextrose  1 ampule Intravenous Once   insulin aspart  2-6 Units Subcutaneous Q4H   midodrine  5 mg Oral TID   Continuous Infusions:  sodium chloride Stopped (10/07/22 1346)   calcium gluconate     norepinephrine (LEVOPHED) Adult infusion Stopped (10/08/22 1158)     LOS: 2 days    Time spent:    Zannie Cove, MD [facility]   10/09/2022, 9:56 AM

## 2022-10-09 NOTE — Progress Notes (Signed)
[facility] Progress Note  Subjective: seen in room, no c/o's.  2.7 L UF yest w HD. Levo gtt was dc'd yest during HD due to NSVT/ ectopy.   Vitals:   10/09/22 0900 10/09/22 0935 10/09/22 1125 10/09/22 1554  BP: 131/77 139/82    Pulse: 99 95    Resp: 11 10    Temp:  (!) 96.7 F (35.9 C) (!) 96.3 F (35.7 C) (!) 97.4 F (36.3 C)  TempSrc:  Oral Axillary Oral  SpO2: 95% 90%    Weight:        Exam: Gen alert, no distress, elderly lady responsive Sclera anicteric, throat clear  No jvd or bruits Chest clear bilat to bases RRR no MRG Abd soft ntnd no mass or ascites +bs obese Ext 2+ bilat LE edema Neuro is alert, Ox 3 , nf              Home meds include - alendronate, amiodarone, apixaban, atrovastatin, ezetimibe, insulin nph-regular, MVI, nystatin, pantoprazole, miralax, vit D, hydrocortisone cream 2.5%        OP HD: MWF SW  3.5h   400/600   78kg  2/2 bath  RIJ TDC  Heparin none - last OP HD 8/05, post wt 78.1, only 1.8kg removed - started OP HD around 7/26 - venofer 100mg  three times per week IV w hd thru 8/28     Assessment/ Plan: Cardiac arrest - in pt w/ known systolic HF and recently started on dialysis. Work up in progress per CCM. Cardiology consulting.  Volume overload - has marked volume overload and LE edema, will need dry wt lowered progressively over time. Initial CXR showed pulm edema, not real symptomatic. 2.7 L off w/ HD yesterday. Due for HD today or tonight.  ESRD - on HD MWF. Had HD here yesterday, HD due today as well. Will consider extra HD tomorrow.  Hypotension - levo gtt dc'd d/t NSVT, is on midodrine 5 tid now  BP - BP's are low here today. Outpt BP's are 100-125 sbp range.   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 Phos is low. Hold any binders for now. Follow.  HFrEF - echo showed LVEF 25-30%. BP's too low for GDMT.  Chronic atrial fib - per cards/ CCM. On IV heparin for now.  Debility /  deconditioning IDDM     Vinson Moselle MD  [facility] 10/09/2022, 4:07 PM  Recent Labs  Lab 10/08/22 0555 10/09/22 0224  HGB 7.3* 6.4*  ALBUMIN <1.5* 2.0*  CALCIUM 7.9* 7.8*  PHOS 2.9 2.3*  CREATININE 2.85* 1.96*  K 4.2 3.5   Recent Labs  Lab 10/09/22 0555  IRON 17*  TIBC 119*  FERRITIN 269   Inpatient medications:  sodium chloride   Intravenous Once   amiodarone  200 mg Oral Daily   apixaban  2.5 mg Oral BID   Chlorhexidine Gluconate Cloth  6 each Topical Daily   Chlorhexidine Gluconate Cloth  6 each Topical Q0600   Chlorhexidine Gluconate Cloth  6 each Topical Q0600   dextrose  1 ampule Intravenous Once   insulin aspart  2-6 Units Subcutaneous Q4H   midodrine  5 mg Oral TID   phosphorus  500 mg Oral BID    sodium chloride Stopped (10/07/22 1346)   acetaminophen, docusate sodium, fentaNYL (SUBLIMAZE) injection, oxyCODONE, polyethylene glycol

## 2022-10-09 NOTE — Consult Note (Signed)
Consultation Note Date: 10/09/2022   Patient Name: [name] [name]  DOB: September 18, 1940  MRN: [id number]  Age / Sex: 82 y.o., female  PCP: [name], MD Referring Physician: Zannie Cove, MD  Reason for Consultation: Establishing goals of care  HPI/Patient Profile: 82 y.o. female  with past medical history of chronic systolic CHF, ESRD recently started on hemodialysis last month, P atrial fibrillation, diabetes, and CVA admitted on 10/07/2022 with cardiac arrest during dialysis.  Etiology of cardiac arrest unclear.  Patient with ongoing volume overload however hypotension limits volume removal.  She also has cardiomyopathy that complicates situation as well as severe hypoalbuminemia.  PMT consulted to discuss goals of care.  Of note, PMT was involved during last admission July 2024.  At that time decision was made for DNR but continue all other measures including dialysis.  Clinical Assessment and Goals of Care: I have reviewed medical records including EPIC notes, labs and imaging, received report from RN, assessed the patient and then met with patient to discuss diagnosis prognosis, GOC, EOL wishes, disposition and options.  No family at bedside at the time of my visit  I introduced Palliative Medicine as specialized medical care for people living with serious illness. It focuses on providing relief from the symptoms and stress of a serious illness. The goal is to improve quality of life for both the patient and the family.  The patient was oriented to self, time, place, and situation during our conversation I did note that there was some confusion.  We discussed a brief life review of the patient.  She tells me about growing up in [place] and how she met her spouse.  She tells me about their 58-year marriage.  She tells me about their grandchildren and great-grandchildren -a granddaughter and great grandchild currently live with her and her  spouse.  We discussed her time since last admission.  She has been at [facility].  She tells me just prior to admission she was up walking short distances with a walker.  She tells me she was eating okay.   We discussed patient's current illness and what it means in the larger context of patient's on-going co-morbidities.  We discussed her cardiac arrest as well as her heart failure and ongoing need for dialysis.  I attempted to elicit values and goals of care important to the patient.  We discussed the plan of discharging back to [facility] and continuing dialysis.  She tells me she is agreeable to this plan.  Discussed with patient the importance of continued conversation with family and the medical providers regarding overall plan of care and treatment options, ensuring decisions are within the context of the patients values and GOCs.    Deferred further conversation of goals of care until spouse can be present with patient.  Called spouse Jonny Ruiz.  Reviewed current clinical condition.  Discussed meeting tomorrow, he is agreeable and will meet me around 12 tomorrow.  Questions and concerns were addressed.   Primary Decision Maker PATIENT joined by spouse    SUMMARY OF RECOMMENDATIONS   Ongoing discussions, to meet with patient and spouse together tomorrow ~12 - at this point patient planning for dc back to [facility] and continue HD, she understands concerns with fluid removal  Code Status/Advance Care Planning: DNR   Symptom Management:  Prn tylenol for ongoing chest pain - scheduled tylenol dc'd by primary today     Primary Diagnoses: Present on Admission:  Cardiac arrest (HCC)  Anemia  in chronic kidney disease (CKD)  Atrial fibrillation with rapid ventricular response (HCC)  Severe obesity (BMI >= 40) (HCC)  Acute systolic CHF (congestive heart failure) (HCC)   I have reviewed the medical record, interviewed the patient and family, and examined the patient. The  following aspects are pertinent.  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   HYPERLIPIDEMIA 03/27/2006   HYPERTENSION 03/27/2006   Intertrochanteric fracture of right hip (HCC) 07/13/2012   LUNG NODULE 09/01/2006   Excision, Bx Benighn   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"   Osteopenia 2004   Dexa 2004 showed Osteopenia, DEXA 03/2007 normal   Osteopenia    Other chronic cystitis with hematuria    Pelvic fracture (HCC) 06/16/2016   S/P fall   Recurrent urinary tract infection    Seeing Urology   RETINOPATHY, BACKGROUND NOS 03/27/2006   Seizures (HCC)    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   Social History   Socioeconomic History   Marital status: Married    Spouse name: [name]   Number of children: 1   Years of education: [employer] education level: Not on file  Occupational History   Occupation: retired     Associate Professor: RETIRED  Tobacco Use   Smoking status: Former    Current packs/day: 0.00    Types: Cigarettes    Quit date: 03/02/1978    Years since quitting: 44.6   Smokeless tobacco: Never  Vaping Use   Vaping status: Never Used  Substance and Sexual Activity   Alcohol use: No    Alcohol/week: 0.0 standard drinks of alcohol   Drug use: No   Sexual activity: Not on file  Other Topics Concern   Not on file  Social History Narrative   Patient lives at home spouse. Uses a walker consistently.   Moved from [place] 2004   1 son, problems w/ drugs, passed away February 28, 2016-- OD       Social Determinants of Health    Financial Resource Strain: Low Risk  (08/22/2020)   Overall Financial Resource Strain (CARDIA)    Difficulty of Paying Living Expenses: Not hard at all  Food Insecurity: No Food Insecurity (09/06/2022)   Hunger Vital Sign    Worried About Running Out of Food in the Last Year: Never true    Ran Out of Food in the Last Year: Never true  Transportation Needs: No Transportation Needs (09/06/2022)   PRAPARE - Administrator, Civil Service (Medical): No    Lack of Transportation (Non-Medical): No  Physical Activity: Inactive (08/22/2020)   Exercise Vital Sign    Days of Exercise per Week: 0 days    Minutes of Exercise per Session: 0 min  Stress: No Stress Concern Present (08/22/2020)   [employer] of Occupational Health - Occupational Stress Questionnaire    Feeling of Stress : Not at all  Social Connections: Moderately Integrated (08/22/2020)   Social Connection and Isolation Panel [NHANES]  Frequency of Communication with Friends and Family: More than three times a week    Frequency of Social Gatherings with Friends and Family: More than three times a week    Attends Religious Services: More than 4 times per year    Active Member of [employer] or Organizations: No    Attends Engineer, structural: Never    Marital Status: Married   Family History  Problem Relation Age of Onset   Diabetes Father    Heart attack Father 69   Diabetes Sister    Drug abuse Son    Cancer Neg Hx        no hx of colon or breast cancer   Scheduled Meds:  sodium chloride   Intravenous Once   amiodarone  200 mg Oral Daily   apixaban  2.5 mg Oral BID   Chlorhexidine Gluconate Cloth  6 each Topical Daily   Chlorhexidine Gluconate Cloth  6 each Topical Q0600   Chlorhexidine Gluconate Cloth  6 each Topical Q0600   dextrose  1 ampule Intravenous Once   insulin aspart  2-6 Units Subcutaneous Q4H   midodrine  5 mg Oral TID   phosphorus  500 mg Oral BID   Continuous Infusions:  sodium  chloride Stopped (10/07/22 1346)   PRN Meds:.acetaminophen, docusate sodium, fentaNYL (SUBLIMAZE) injection, oxyCODONE, polyethylene glycol Allergies  Allergen Reactions   Procaine Hcl Anaphylaxis   Amoxicillin Itching   Review of Systems  Constitutional:  Positive for activity change.  Musculoskeletal:        Rib pain r/t CPR    Physical Exam Constitutional:      General: She is not in acute distress.    Appearance: She is ill-appearing.  Pulmonary:     Effort: Pulmonary effort is normal.  Skin:    General: Skin is warm and dry.  Neurological:     Mental Status: She is alert and oriented to person, place, and time.     Comments: Some confusion noted during convo     Vital Signs: BP 139/82   Pulse 95   Temp (!) 97.4 F (36.3 C) (Oral)   Resp 10   Wt 80.2 kg   SpO2 90%   BMI 26.11 kg/m  Pain Scale: 0-10   Pain Score: 0-No pain   SpO2: SpO2: 90 % O2 Device:SpO2: 90 % O2 Flow Rate: .O2 Flow Rate (L/min): 2 L/min  IO: Intake/output summary:  Intake/Output Summary (Last 24 hours) at 10/09/2022 1556 Last data filed at 10/09/2022 0935 Gross per 24 hour  Intake 685.86 ml  Output --  Net 685.86 ml    LBM: Last BM Date :  (PTA) Baseline Weight: Weight: 79.6 kg Most recent weight: Weight: 80.2 kg     Palliative Assessment/Data:PPS 40%     *Please note that this is a verbal dictation therefore any spelling or grammatical errors are due to the "Dragon Medical One" system interpretation.   Gerlean Ren, DNP, AGNP-C Palliative Medicine Team [phone] Pager: [phone]

## 2022-10-10 DIAGNOSIS — I5021 Acute systolic (congestive) heart failure: Secondary | ICD-10-CM | POA: Diagnosis not present

## 2022-10-10 DIAGNOSIS — N186 End stage renal disease: Secondary | ICD-10-CM | POA: Diagnosis not present

## 2022-10-10 DIAGNOSIS — I469 Cardiac arrest, cause unspecified: Secondary | ICD-10-CM | POA: Diagnosis not present

## 2022-10-10 DIAGNOSIS — Z7189 Other specified counseling: Secondary | ICD-10-CM | POA: Diagnosis not present

## 2022-10-10 LAB — GLUCOSE, CAPILLARY
Glucose-Capillary: 82 mg/dL (ref 70–99)
Glucose-Capillary: 99 mg/dL (ref 70–99)
Glucose-Capillary: 165 mg/dL — ABNORMAL HIGH (ref 70–99)
Glucose-Capillary: 111 mg/dL — ABNORMAL HIGH (ref 70–99)
Glucose-Capillary: 190 mg/dL — ABNORMAL HIGH (ref 70–99)

## 2022-10-10 LAB — CBC
HCT: 28.5 % — ABNORMAL LOW (ref 36.0–46.0)
Hemoglobin: 9.5 g/dL — ABNORMAL LOW (ref 12.0–15.0)
MCH: 29.8 pg (ref 26.0–34.0)
MCHC: 33.3 g/dL (ref 30.0–36.0)
MCV: 89.3 fL (ref 80.0–100.0)
Platelets: 295 K/uL (ref 150–400)
RBC: 3.19 MIL/uL — ABNORMAL LOW (ref 3.87–5.11)
RDW: 18.6 % — ABNORMAL HIGH (ref 11.5–15.5)
WBC: 13.2 K/uL — ABNORMAL HIGH (ref 4.0–10.5)
nRBC: 0 % (ref 0.0–0.2)

## 2022-10-10 MED ORDER — SODIUM CHLORIDE 0.9 % IV SOLN
500.0000 mg | Freq: Every day | INTRAVENOUS | Status: DC
  Administered 2022-10-11 – 2022-10-15 (×6): 500 mg via INTRAVENOUS
  Filled 2022-10-10 (×8): qty 10

## 2022-10-10 MED ORDER — MIDODRINE HCL 5 MG PO TABS
10.0000 mg | ORAL_TABLET | Freq: Three times a day (TID) | ORAL | Status: DC
  Administered 2022-10-10: 10 mg via ORAL
  Filled 2022-10-10: qty 2

## 2022-10-10 MED ORDER — GUAIFENESIN 100 MG/5ML PO LIQD: 5.0000 mL | ORAL | Status: DC | PRN

## 2022-10-10 MED ORDER — INSULIN ASPART 100 UNIT/ML IJ SOLN
0.0000 [IU] | Freq: Every day | INTRAMUSCULAR | Status: DC
  Administered 2022-10-11: 2 [IU] via SUBCUTANEOUS

## 2022-10-10 MED ORDER — K PHOS MONO-SOD PHOS DI & MONO 155-852-130 MG PO TABS
500.0000 mg | ORAL_TABLET | Freq: Two times a day (BID) | ORAL | Status: AC
  Administered 2022-10-10 (×2): 500 mg via ORAL
  Filled 2022-10-10 (×2): qty 2

## 2022-10-10 MED ORDER — TRAZODONE HCL 50 MG PO TABS
50.0000 mg | ORAL_TABLET | Freq: Every evening | ORAL | Status: DC | PRN
  Filled 2022-10-10: qty 1

## 2022-10-10 MED ORDER — IPRATROPIUM-ALBUTEROL 0.5-2.5 (3) MG/3ML IN SOLN: 3.0000 mL | RESPIRATORY_TRACT | Status: DC | PRN

## 2022-10-10 MED ORDER — METOPROLOL TARTRATE 5 MG/5ML IV SOLN: 5.0000 mg | INTRAVENOUS | Status: DC | PRN

## 2022-10-10 MED ORDER — HEPARIN SODIUM (PORCINE) 1000 UNIT/ML IJ SOLN
INTRAMUSCULAR | Status: AC
Start: 2022-10-10 — End: 2022-10-10
  Administered 2022-10-10: 3200 [IU]
  Filled 2022-10-10: qty 4

## 2022-10-10 MED ORDER — INSULIN ASPART 100 UNIT/ML IJ SOLN
0.0000 [IU] | Freq: Three times a day (TID) | INTRAMUSCULAR | Status: DC
  Administered 2022-10-11 (×2): 3 [IU] via SUBCUTANEOUS

## 2022-10-10 MED ORDER — HEPARIN SODIUM (PORCINE) 1000 UNIT/ML IJ SOLN
INTRAMUSCULAR | Status: AC
  Filled 2022-10-10: qty 4

## 2022-10-10 MED ORDER — SENNOSIDES-DOCUSATE SODIUM 8.6-50 MG PO TABS: 1.0000 | ORAL_TABLET | Freq: Every evening | ORAL | Status: DC | PRN

## 2022-10-10 MED ORDER — ONDANSETRON HCL 4 MG/2ML IJ SOLN: 4.0000 mg | Freq: Four times a day (QID) | INTRAMUSCULAR | Status: DC | PRN

## 2022-10-10 MED ORDER — HYDRALAZINE HCL 20 MG/ML IJ SOLN: 10.0000 mg | INTRAMUSCULAR | Status: DC | PRN

## 2022-10-10 MED ORDER — INSULIN ASPART 100 UNIT/ML IJ SOLN: 2.0000 [IU] | Freq: Three times a day (TID) | INTRAMUSCULAR | Status: DC

## 2022-10-10 MED ORDER — DEXTROSE 5 % IV SOLN
30.0000 mmol | Freq: Once | INTRAVENOUS | Status: DC
Start: 2022-10-10 — End: 2022-10-10

## 2022-10-10 MED ORDER — LIDOCAINE 5 % EX PTCH: 2.0000 | MEDICATED_PATCH | Freq: Every day | CUTANEOUS | Status: DC | PRN

## 2022-10-10 NOTE — Progress Notes (Signed)
Daily Progress Note   Patient Name: [name] [name]       Date: 10/10/2022 DOB: 11/13/1940  Age: 82 y.o. MRN#: [id number] Attending Physician: Dimple Nanas, MD Primary Care Physician: [name], MD Admit Date: 10/07/2022  Reason for Consultation/Follow-up: Establishing goals of care  Subjective: No complaints, had HD last night, went well - plans for HD later today.  Length of Stay: 3  Current Medications: Scheduled Meds:   sodium chloride   Intravenous Once   amiodarone  200 mg Oral Daily   apixaban  2.5 mg Oral BID   Chlorhexidine Gluconate Cloth  6 each Topical Q0600   dextrose  1 ampule Intravenous Once   heparin sodium (porcine)       insulin aspart  0-15 Units Subcutaneous TID WC   insulin aspart  0-5 Units Subcutaneous QHS   midodrine  10 mg Oral TID   phosphorus  500 mg Oral BID    Continuous Infusions:  sodium chloride Stopped (10/07/22 1346)    PRN Meds: acetaminophen, docusate sodium, fentaNYL (SUBLIMAZE) injection, guaiFENesin, heparin sodium (porcine), hydrALAZINE, ipratropium-albuterol, lidocaine, metoprolol tartrate, ondansetron (ZOFRAN) IV, oxyCODONE, polyethylene glycol, senna-docusate, traZODone  Physical Exam Constitutional:      General: She is not in acute distress.    Appearance: She is ill-appearing.  Pulmonary:     Effort: Pulmonary effort is normal.  Skin:    General: Skin is warm and dry.  Neurological:     Mental Status: She is alert and oriented to person, place, and time.     Comments: Easily confused             Vital Signs: BP (!) 124/47 (BP Location: Left Arm)   Pulse 69   Temp 97.6 F (36.4 C) (Oral)   Resp 14   Wt 77.2 kg   SpO2 96%   BMI 25.13 kg/m  SpO2: SpO2: 96 % O2 Device: O2 Device: Room Air O2 Flow Rate: O2 Flow Rate  (L/min): 2 L/min  Intake/output summary:  Intake/Output Summary (Last 24 hours) at 10/10/2022 1130 Last data filed at 10/10/2022 0900 Gross per 24 hour  Intake 460 ml  Output 20907.72 ml  Net -20447.72 ml   LBM: Last BM Date :  (PTA) Baseline Weight: Weight: 79.6 kg Most recent weight: Weight: 77.2 kg       Palliative Assessment/Data: PPS 40%      Patient Active Problem List   Diagnosis Date Noted   Cardiac arrest (HCC) 10/07/2022   ESRD on dialysis (HCC) 10/07/2022   Hypokalemia 10/07/2022   Cardiac arrest, cause unspecified (HCC) 10/07/2022   Hyperkalemia 09/11/2022   Anemia in chronic kidney disease (CKD) 09/09/2022   Chronic systolic CHF (congestive heart failure) (HCC) 09/09/2022   ESBL Bacteremia due to UTI 09/07/2022   Acute CVA (cerebrovascular accident) (HCC) 09/06/2022   A-fib (HCC) 08/04/2022   Atrial fibrillation with rapid ventricular response (HCC) 08/03/2022   Acute renal failure superimposed on stage 4 chronic kidney disease (HCC) 07/20/2022   Supratherapeutic INR 07/08/2022   Hemorrhagic cystitis 07/08/2022   Gross hematuria 07/07/2022   CKD (chronic kidney disease) stage 4, GFR 15-29 ml/min (HCC) 04/10/2020   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  Osteoporosis 07/22/2018   Closed compression fracture of L1 vertebra (HCC) 07/19/2018   Cardiomyopathy (HCC) 04/07/2018   Foot ulcer (HCC) 07/24/2016   Gram-negative infection    CKD (chronic kidney disease) stage 3, GFR 30-59 ml/min (HCC)    Postoperative anemia due to acute blood loss 01/04/2016   Left hip postoperative wound infection 01/02/2016   Paroxysmal atrial fibrillation (HCC) 05/20/2015   CAD S/P percutaneous coronary angioplasty 05/20/2015   Acute systolic CHF (congestive heart failure) (HCC) 05/20/2015   Acute kidney injury superimposed on chronic kidney disease (HCC) 05/11/2015   PCP NOTES >>>>>>>>>>>>>>>> 12/28/2014   Memory loss 05/17/2014   Diabetes (HCC) 01/23/2014   Anxiety and  depression, PCP notes  11/24/2013   Severe obesity (BMI >= 40) (HCC) 07/14/2013   Encounter for therapeutic drug monitoring 06/02/2013   History of encephalitis 11/18/2012   Intertrochanteric fracture of right hip (HCC) 07/13/2012   GERD (gastroesophageal reflux disease) 10/05/2011   Seizure disorder (HCC) 05/24/2011   Hyponatremia 05/17/2011   Annual physical exam >>>>>>>>>>>>>>>>>>>>>>>>>>>>>>>.. 02/10/2011   LUNG NODULE 09/01/2006   Hyperlipidemia 03/27/2006   Hereditary and idiopathic peripheral neuropathy 03/27/2006   RETINOPATHY, BACKGROUND NOS 03/27/2006   Essential hypertension 03/27/2006    Palliative Care Assessment & Plan   HPI: 82 y.o. female  with past medical history of chronic systolic CHF, ESRD recently started on hemodialysis last month, P atrial fibrillation, diabetes, and CVA admitted on 10/07/2022 with cardiac arrest during dialysis.  Etiology of cardiac arrest unclear.  Patient with ongoing volume overload however hypotension limits volume removal.  She also has cardiomyopathy that complicates situation as well as severe hypoalbuminemia.  PMT consulted to discuss goals of care.   Of note, PMT was involved during last admission July 2024.  At that time decision was made for DNR but continue all other measures including dialysis.  Assessment: Meeting today to include patient and spouse. We discuss difficulties that have been had with HD. Discuss difficult to manage both kidney disease and heart disease and how they affect one another. Also discussed her severe hypoalbuminemia and how this affects her body.   All questions and concerns addressed.  Spouse and patient agree they want her to return to [facility] and continue with HD, hopeful she tolerates well. They understand concerns of repeated complications given her other illnesses.   Recommendations/Plan: Plans to continue w/current level of care - spouse hopeful for return to [facility], continue HD Will  request outpatient palliative referral to follow at SNF  Code Status: DNR  Discharge Planning: Skilled Nursing Facility for rehab with Palliative care service follow-up  Care plan was discussed with patient and spouse  Thank you for allowing the Palliative Medicine Team to assist in the care of this patient.  *Please note that this is a verbal dictation therefore any spelling or grammatical errors are due to the "Dragon Medical One" system interpretation.  Gerlean Ren, DNP, [facility] Palliative Medicine Team Team Phone # [phone]  Pager [phone]

## 2022-10-10 NOTE — Progress Notes (Signed)
PHARMACY - PHYSICIAN COMMUNICATION CRITICAL VALUE ALERT - BLOOD CULTURE IDENTIFICATION (BCID)  [place] E [name] [name] is an 82 y.o. female with ESRD on HD who presented to [facility] on 10/07/2022 s/p cardiac arrest  Assessment:   Blood culture from 8/7 growing ESBL producing Klebsiella pneumoniae  Name of physician (or Provider) Contacted:  Dr. Arlean Hopping  Current antibiotics:  None  Changes to prescribed antibiotics recommended:  Start meropenem 500 mg IV q24h   F/U ID recommendations  Results for orders placed or performed during the hospital encounter of 10/07/22  Blood Culture ID Panel (Reflexed) (Collected: 10/07/2022  3:29 PM)  Result Value Ref Range   Enterococcus faecalis NOT DETECTED NOT DETECTED   Enterococcus Faecium NOT DETECTED NOT DETECTED   Listeria monocytogenes NOT DETECTED NOT DETECTED   Staphylococcus species NOT DETECTED NOT DETECTED   Staphylococcus aureus (BCID) NOT DETECTED NOT DETECTED   Staphylococcus epidermidis NOT DETECTED NOT DETECTED   Staphylococcus lugdunensis NOT DETECTED NOT DETECTED   Streptococcus species NOT DETECTED NOT DETECTED   Streptococcus agalactiae NOT DETECTED NOT DETECTED   Streptococcus pneumoniae NOT DETECTED NOT DETECTED   Streptococcus pyogenes NOT DETECTED NOT DETECTED   A.calcoaceticus-baumannii NOT DETECTED NOT DETECTED   Bacteroides fragilis NOT DETECTED NOT DETECTED   Enterobacterales DETECTED (A) NOT DETECTED   Enterobacter cloacae complex NOT DETECTED NOT DETECTED   Escherichia coli NOT DETECTED NOT DETECTED   Klebsiella aerogenes NOT DETECTED NOT DETECTED   Klebsiella oxytoca NOT DETECTED NOT DETECTED   Klebsiella pneumoniae DETECTED (A) NOT DETECTED   Proteus species NOT DETECTED NOT DETECTED   Salmonella species NOT DETECTED NOT DETECTED   Serratia marcescens NOT DETECTED NOT DETECTED   Haemophilus influenzae NOT DETECTED NOT DETECTED   Neisseria meningitidis NOT DETECTED NOT DETECTED   Pseudomonas aeruginosa NOT  DETECTED NOT DETECTED   Stenotrophomonas maltophilia NOT DETECTED NOT DETECTED   Candida albicans NOT DETECTED NOT DETECTED   Candida auris NOT DETECTED NOT DETECTED   Candida glabrata NOT DETECTED NOT DETECTED   Candida krusei NOT DETECTED NOT DETECTED   Candida parapsilosis NOT DETECTED NOT DETECTED   Candida tropicalis NOT DETECTED NOT DETECTED   Cryptococcus neoformans/gattii NOT DETECTED NOT DETECTED   CTX-M ESBL DETECTED (A) NOT DETECTED   Carbapenem resistance IMP NOT DETECTED NOT DETECTED   Carbapenem resistance KPC NOT DETECTED NOT DETECTED   Carbapenem resistance NDM NOT DETECTED NOT DETECTED   Carbapenem resist OXA 48 LIKE NOT DETECTED NOT DETECTED   Carbapenem resistance VIM NOT DETECTED NOT DETECTED    Eddie Candle 10/10/2022  10:59 PM

## 2022-10-10 NOTE — Progress Notes (Signed)
[facility] Progress Note  Subjective: seen in room, no c/o's.    Vitals:   10/10/22 0500 10/10/22 0600 10/10/22 0738 10/10/22 1212  BP:  (!) 118/49 (!) 124/47   Pulse:   69   Resp: 16 14 14    Temp:   97.6 F (36.4 C) 98.6 F (37 C)  TempSrc:   Oral Oral  SpO2:   96%   Weight:        Exam: Gen alert, no distress, elderly lady responsive Sclera anicteric, throat clear  No jvd or bruits Chest clear bilat to bases RRR no MRG Abd soft ntnd no mass or ascites +bs obese Ext 2+ bilat LE edema Neuro is alert, Ox 3 , nf        Home meds include - alendronate, amiodarone, apixaban, atrovastatin, ezetimibe, insulin nph-regular, MVI, nystatin, pantoprazole, miralax, vit D, hydrocortisone cream 2.5%        OP HD: MWF SW  3.5h   400/600   78kg  2/2 bath  RIJ TDC  Heparin none - last OP HD 8/05, post wt 78.1, only 1.8kg removed - started OP HD around 7/26 - venofer 100mg  three times per week IV w hd thru 8/28     Assessment/ Plan: Cardiac arrest - occurred during OP HD session. 4 rounds of CPR. Long hx of systolic HF and recently started on dialysis. Seen by cardiology.  Volume overload - marked volume overload/ edema and pulm edema by CXR on admission. 2.7 L off w/ HD Thursday and 2.9 L removed Friday. Is now under dry wt by 1kg, but still has sig vol overload. Will repeat CXR and do extra HD today.  ESRD - on HD MWF. Has HD 2 HD sessions here. Extra HD today off schedule.  Hypotension - ^'d midodrine to 10mg  tid today BP - BP's are low to normal today. Outpt BP's are 100-125 sbp range.   Anemia esrd - Hb 8.1 , then dropped to 6s, got 1u prbcs and Hb up to 9.5 MBD ckd - CCa is in range. Phos is low. Hold any binders for now. Follow.  HFrEF - echo showed LVEF 25-30%. BP's too low for GDMT.  Chronic atrial fib - per cards/ CCM. On IV heparin for now.  IDDM     Vinson Moselle MD  [facility] 10/10/2022, 1:24 PM  Recent Labs  Lab 10/09/22 0224 10/09/22 1757  10/10/22 0407  HGB 6.4* 9.1* 9.5*  ALBUMIN 2.0*  --  1.9*  CALCIUM 7.8*  --  7.9*  PHOS 2.3*  --  1.8*  CREATININE 1.96*  --  1.30*  K 3.5  --  3.6   Recent Labs  Lab 10/09/22 0555  IRON 17*  TIBC 119*  FERRITIN 269   Inpatient medications:  sodium chloride   Intravenous Once   amiodarone  200 mg Oral Daily   apixaban  2.5 mg Oral BID   Chlorhexidine Gluconate Cloth  6 each Topical Q0600   dextrose  1 ampule Intravenous Once   heparin sodium (porcine)       heparin sodium (porcine)       insulin aspart  0-15 Units Subcutaneous TID WC   insulin aspart  0-5 Units Subcutaneous QHS   midodrine  10 mg Oral TID   phosphorus  500 mg Oral BID    sodium chloride Stopped (10/07/22 1346)   acetaminophen, docusate sodium, fentaNYL (SUBLIMAZE) injection, guaiFENesin, heparin sodium (porcine), heparin sodium (porcine), hydrALAZINE, ipratropium-albuterol, lidocaine, metoprolol tartrate, ondansetron (ZOFRAN) IV,  oxyCODONE, polyethylene glycol, senna-docusate, traZODone

## 2022-10-10 NOTE — Progress Notes (Signed)
Received patient in bed to unit.  Alert and oriented.  Informed consent signed and in chart.   TX duration:3:15  Patient tolerated well.  Transported back to the room  Alert, without acute distress.  Hand-off given to patient's nurse.   Access used: right [facility] Access issues: none  Total UF removed: 2.6L Medication(s) given: none   10/10/22 1640  Vitals  Temp 97.8 F (36.6 C)  Temp Source Oral  BP (!) 118/48  MAP (mmHg) 69  BP Location Left Arm  BP Method Automatic  Patient Position (if appropriate) Lying  Pulse Rate 68  Pulse Rate Source Monitor  ECG Heart Rate 69  Resp 20  Oxygen Therapy  SpO2 98 %  O2 Device Room Air  During Treatment Monitoring  Blood Flow Rate (mL/min) 400 mL/min  Arterial Pressure (mmHg) -177.57 mmHg  Venous Pressure (mmHg) 150.5 mmHg  TMP (mmHg) -7.68 mmHg  Ultrafiltration Rate (mL/min) 0 mL/min  Dialysate Flow Rate (mL/min) 300 ml/min  Intra-Hemodialysis Comments Tx completed;Tolerated well  Dialysis Fluid Bolus Normal Saline  Bolus Amount (mL) 300 mL  Dialysate Potassium Concentration 3  Dialysate Calcium Concentration 2.5  Post Treatment  Duration of HD Treatment -hour(s) 3.23 hour(s)  Fluid Removed (mL) 2643.28 mL     S  [facility]

## 2022-10-10 NOTE — Plan of Care (Signed)
Patient remains in HF PCU at time of writing. S/P HD again this evening.   Problem: Education: Goal: Knowledge of General Education information will improve Description: Including pain rating scale, medication(s)/side effects and non-pharmacologic comfort measures Outcome: Progressing   Problem: Health Behavior/Discharge Planning: Goal: Ability to manage health-related needs will improve Outcome: Progressing   Problem: Clinical Measurements: Goal: Ability to maintain clinical measurements within normal limits will improve Outcome: Progressing Goal: Will remain free from infection Outcome: Progressing Goal: Diagnostic test results will improve Outcome: Progressing Goal: Respiratory complications will improve Outcome: Progressing Goal: Cardiovascular complication will be avoided Outcome: Progressing   Problem: Activity: Goal: Risk for activity intolerance will decrease Outcome: Progressing   Problem: Nutrition: Goal: Adequate nutrition will be maintained Outcome: Progressing   Problem: Coping: Goal: Level of anxiety will decrease Outcome: Progressing   Problem: Elimination: Goal: Will not experience complications related to bowel motility Outcome: Progressing Goal: Will not experience complications related to urinary retention Outcome: Progressing   Problem: Pain Managment: Goal: General experience of comfort will improve Outcome: Progressing   Problem: Safety: Goal: Ability to remain free from injury will improve Outcome: Progressing   Problem: Skin Integrity: Goal: Risk for impaired skin integrity will decrease Outcome: Progressing

## 2022-10-10 NOTE — Progress Notes (Signed)
Received patient in bed to unit.  Alert and oriented.  Informed consent signed and in chart.   TX duration: 3hours68min  Patient tolerated well.  Transported back to the room  Alert, without acute distress.  Hand-off given to patient's nurse.   Access used: catheter Access issues: none  Total UF removed: 2900 ml Medication(s) given: none Post HD VS: 107/52 Post HD weight: 77.5 kg     10/10/22 0259  Vitals  Temp 98.6 F (37 C)  Temp Source Oral  BP (!) 102/34  MAP (mmHg) (!) 54  BP Location Left Arm  BP Method Automatic  Patient Position (if appropriate) Lying  Pulse Rate 64  Pulse Rate Source Monitor  ECG Heart Rate 65  Resp 12  Oxygen Therapy  SpO2 97 %  O2 Device Room Air  Patient Activity (if Appropriate) In bed  Pulse Oximetry Type Continuous  During Treatment Monitoring  Blood Flow Rate (mL/min) 0 mL/min  Arterial Pressure (mmHg) 0 mmHg  Venous Pressure (mmHg) -2.22 mmHg  TMP (mmHg) -52.72 mmHg  Ultrafiltration Rate (mL/min) 1915 mL/min  Dialysate Flow Rate (mL/min) 300 ml/min  Dialysate Potassium Concentration 3  Dialysate Calcium Concentration 2.5  Post Treatment  Dialyzer Clearance Lightly streaked  Hemodialysis Intake (mL) 0 mL  Liters Processed 780  Tolerated HD Treatment Yes  Post-Hemodialysis Comments HD tc achieved as scheduled, tolerated well, pt is stable.  AVG/AVF Arterial Site Held (minutes) 0 minutes  AVG/AVF Venous Site Held (minutes) 0 minutes  Note  Patient Observations pt is in bed resting  Hemodialysis Catheter Right Internal jugular Double lumen Permanent (Tunneled)  Placement Date/Time: 09/18/22 1209   Serial / Lot #: [id number]  Expiration Date: 03/02/27  Time Out: Correct patient;Correct site;Correct procedure  Maximum sterile barrier precautions: Cap;Mask;Sterile gown;Sterile gloves;Hand hygiene;Large sterile s...  Site Condition No complications  Blue Lumen Status Flushed;Heparin locked;Dead end cap in place  Red Lumen Status  Flushed;Heparin locked;Dead end cap in place  Purple Lumen Status N/A  Catheter fill solution Heparin 1000 units/ml  Catheter fill volume (Arterial) 1.6 cc  Catheter fill volume (Venous) 1.6  Dressing Type Transparent  Dressing Status Antimicrobial disc in place  Drainage Description None  Post treatment catheter status Capped and Clamped

## 2022-10-10 NOTE — Plan of Care (Signed)
Patient transferred to HF PCU from MICU after HD treatment. Report received from the MICU RN and HD RN. Patient is drowsy but arouses to voice and answers questions appropriately. CBG and BP WNL.    Problem: Education: Goal: Knowledge of General Education information will improve Description: Including pain rating scale, medication(s)/side effects and non-pharmacologic comfort measures Outcome: Progressing   Problem: Health Behavior/Discharge Planning: Goal: Ability to manage health-related needs will improve Outcome: Progressing   Problem: Clinical Measurements: Goal: Ability to maintain clinical measurements within normal limits will improve Outcome: Progressing Goal: Will remain free from infection Outcome: Progressing Goal: Diagnostic test results will improve Outcome: Progressing Goal: Respiratory complications will improve Outcome: Progressing Goal: Cardiovascular complication will be avoided Outcome: Progressing   Problem: Activity: Goal: Risk for activity intolerance will decrease Outcome: Progressing   Problem: Nutrition: Goal: Adequate nutrition will be maintained Outcome: Progressing   Problem: Coping: Goal: Level of anxiety will decrease Outcome: Progressing   Problem: Elimination: Goal: Will not experience complications related to bowel motility Outcome: Progressing Goal: Will not experience complications related to urinary retention Outcome: Progressing   Problem: Pain Managment: Goal: General experience of comfort will improve Outcome: Progressing   Problem: Safety: Goal: Ability to remain free from injury will improve Outcome: Progressing   Problem: Skin Integrity: Goal: Risk for impaired skin integrity will decrease Outcome: Progressing

## 2022-10-10 NOTE — Progress Notes (Signed)
PROGRESS NOTE    [name]  GNF:[id number] DOB: 1940-09-24 DOA: 10/07/2022 PCP: [name], MD   Brief Narrative:  81/F with chronic systolic CHF, ESRD recently started on hemodialysis last month, P atrial fibrillation, diabetes, CVA.  Recently residing at SNF/rehab.  She experienced a syncopal episode versus cardiac arrest while she was initiating hemodialysis on 8/7.  She received 8 minutes of CPR, no epinephrine.  Was not intubated, CT head reassuring, CT chest noted nondisplaced multiple rib fractures.  Patient transmitted required norepinephrine.  Echo showed chronic systolic CHF with EF 25% and seen by cardiology.  No further workup.  Transferred out of the ICU on 8/9.    Assessment & Plan:  Principal Problem:   Cardiac arrest (HCC) Active Problems:   Severe obesity (BMI >= 40) (HCC)   Diabetes (HCC)   Acute systolic CHF (congestive heart failure) (HCC)   Atrial fibrillation with rapid ventricular response (HCC)   Anemia in chronic kidney disease (CKD)   ESRD on dialysis (HCC)   Hypokalemia   Cardiac arrest, cause unspecified (HCC)     Cardiac arrest Unclear if this was syncopal episode versus cardiac arrest.  ROSC was achieved in 8 minutes.  Echocardiogram shows EF of 25% with moderately elevated PA pressures and RV was not well-visualized.  Seen by cardiology, no further workup indicated at this time. CT of the head shows left temporal lobe encephalomalacia which is chronic otherwise no acute pathology   Chronic hypotension -Multifactorial in the setting of ESRD, systolic CHF and severe hypoalbuminemia.  Was transiently on IV pressors now off.  Continue midodrine  Nondisplaced multiple rib fractures - Secondary to chest compressions.  Will continue to monitor.  Pain control as necessary.  Allow time to self heal   Volume overload ESRD Severe hypoalbuminemia Nephrology team is following.  Received albumin.   Chronic systolic heart failure Seen by cardiology  team.  Echo shows EF of 25%.  No further workup at this time   A-fib with RVR RVR has resolved, continue amiodarone and Eliquis   Hyponatremia Overall sodium levels are stable around 130   Hypokalemia, resolved Replete as needed   Chronic anemia Hemoglobin stable around 9.5..  It did drop less than 7.0 requiring 1 unit PRBC transfusion on 8/9.   Insulin-dependent diabetes Sliding scale and Accu-Cheks.   Debility, frailty PT/OT.  Given advanced age, multiple comorbidities, palliative care team consulted.     DVT prophylaxis:apixaban Code Status: DNR Family Communication: No family at bedside Disposition Plan: Likely back to SNF   Pressure Injury 10/07/22 Sacrum Posterior;Mid Stage 1 -  Intact skin with non-blanchable redness of a localized area usually over a bony prominence. (Active)  10/07/22 1730  Location: Sacrum  Location Orientation: Posterior;Mid  Staging: Stage 1 -  Intact skin with non-blanchable redness of a localized area usually over a bony prominence.  Wound Description (Comments):   Present on Admission: Yes     Diet Orders (From admission, onward)     Start     Ordered   10/08/22 0845  Diet renal with fluid restriction Fluid restriction: 1200 mL Fluid; Room service appropriate? Yes; Fluid consistency: Thin  Diet effective now       Question Answer Comment  Fluid restriction: 1200 mL Fluid   Room service appropriate? Yes   Fluid consistency: Thin      10/08/22 0844            Subjective: Doing ok, sitting up in the bed, No complaints.  Examination:  General exam: Appears calm and comfortable  Respiratory system: Clear to auscultation. Respiratory effort normal. Cardiovascular system: S1 & S2 heard, RRR. No JVD, murmurs, rubs, gallops or clicks. No pedal edema. Gastrointestinal system: Abdomen is nondistended, soft and nontender. No organomegaly or masses felt. Normal bowel sounds heard. Central nervous system: Alert and oriented. No  focal neurological deficits. Extremities: Symmetric 5 x 5 power. Skin: No rashes, lesions or ulcers Psychiatry: Judgement and insight appear normal. Mood & affect appropriate.  Objective: Vitals:   10/10/22 0456 10/10/22 0500 10/10/22 0600 10/10/22 0738  BP:   (!) 118/49 (!) 124/47  Pulse:    69  Resp: 10 16 14 14   Temp:    97.6 F (36.4 C)  TempSrc:    Oral  SpO2:    96%  Weight: 77.2 kg       Intake/Output Summary (Last 24 hours) at 10/10/2022 0744 Last data filed at 10/10/2022 0246 Gross per 24 hour  Intake 898 ml  Output 20907.72 ml  Net -20009.72 ml   Filed Weights   10/09/22 0500 10/10/22 0354 10/10/22 0456  Weight: 80.2 kg 77.2 kg 77.2 kg    Scheduled Meds:  sodium chloride   Intravenous Once   amiodarone  200 mg Oral Daily   apixaban  2.5 mg Oral BID   Chlorhexidine Gluconate Cloth  6 each Topical Daily   Chlorhexidine Gluconate Cloth  6 each Topical Q0600   Chlorhexidine Gluconate Cloth  6 each Topical Q0600   Chlorhexidine Gluconate Cloth  6 each Topical Q0600   dextrose  1 ampule Intravenous Once   heparin sodium (porcine)       insulin aspart  2-6 Units Subcutaneous Q4H   midodrine  5 mg Oral TID   Continuous Infusions:  sodium chloride Stopped (10/07/22 1346)    Nutritional status     Body mass index is 25.13 kg/m.  Data Reviewed:   CBC: Recent Labs  Lab 10/07/22 1332 10/08/22 0555 10/09/22 0224 10/09/22 1757 10/10/22 0407  WBC 21.7* 13.0* 11.9*  --  13.2*  NEUTROABS 16.0*  --   --   --   --   HGB 8.1* 7.3* 6.4* 9.1* 9.5*  HCT 26.3* 23.5* 20.4* 28.4* 28.5*  MCV 92.6 91.8 93.6  --  89.3  PLT 278 267 273  --  295   Basic Metabolic Panel: Recent Labs  Lab 10/07/22 1529 10/07/22 1816 10/08/22 0555 10/09/22 0224 10/10/22 0407  NA 131* 130* 131* 130* 132*  K 2.8* 3.0* 4.2 3.5 3.6  CL 94* 92* 95* 94* 97*  CO2 28 27 27 26 25   GLUCOSE 62* 103* 78 98 90  BUN 15 16 19 13 9   CREATININE 2.54* 2.53* 2.85* 1.96* 1.30*  CALCIUM 8.2* 7.7*  7.9* 7.8* 7.9*  MG 1.6*  --  1.8 1.9  --   PHOS 2.1*  --  2.9 2.3* 1.8*   GFR: Estimated Creatinine Clearance: 36.1 mL/min (A) (by C-G formula based on SCr of 1.3 mg/dL (H)). Liver Function Tests: Recent Labs  Lab 10/07/22 1529 10/07/22 1816 10/08/22 0555 10/09/22 0224 10/10/22 0407  AST 32 28  --   --  19  ALT 14 12  --   --  9  ALKPHOS 86 77  --   --  91  BILITOT 0.8 0.8  --   --  1.1  PROT 5.5* 4.8*  --   --  5.6*  ALBUMIN <1.5* <1.5* <1.5* 2.0* 1.9*   No results for  input(s): "LIPASE", "AMYLASE" in the last 168 hours. No results for input(s): "AMMONIA" in the last 168 hours. Coagulation Profile: Recent Labs  Lab 10/07/22 1816  INR 2.3*   Cardiac Enzymes: No results for input(s): "CKTOTAL", "CKMB", "CKMBINDEX", "TROPONINI" in the last 168 hours. BNP (last 3 results) No results for input(s): "PROBNP" in the last 8760 hours. HbA1C: No results for input(s): "HGBA1C" in the last 72 hours. CBG: Recent Labs  Lab 10/09/22 1121 10/09/22 1552 10/09/22 1941 10/10/22 0344 10/10/22 0733  GLUCAP 151* 236* 172* 82 99   Lipid Profile: No results for input(s): "CHOL", "HDL", "LDLCALC", "TRIG", "CHOLHDL", "LDLDIRECT" in the last 72 hours. Thyroid Function Tests: No results for input(s): "TSH", "T4TOTAL", "FREET4", "T3FREE", "THYROIDAB" in the last 72 hours. Anemia Panel: Recent Labs    10/09/22 0223 10/09/22 0555  VITAMINB12  --  744  FOLATE  --  12.0  FERRITIN  --  269  TIBC  --  119*  IRON  --  17*  RETICCTPCT 3.8*  --    Sepsis Labs: No results for input(s): "PROCALCITON", "LATICACIDVEN" in the last 168 hours.  Recent Results (from the past 240 hour(s))  Blood culture (routine x 2)     Status: None (Preliminary result)   Collection Time: 10/07/22  3:29 PM   Specimen: BLOOD RIGHT ARM  Result Value Ref Range Status   Specimen Description BLOOD RIGHT ARM  Final   Special Requests NONE  Final   Culture   Final    NO GROWTH 2 DAYS Performed at [facility], [address], [place], [place] [address]    Report Status PENDING  Incomplete  MRSA Next Gen by PCR, Nasal     Status: None   Collection Time: 10/07/22  5:19 PM   Specimen: Nasal Mucosa; Nasal Swab  Result Value Ref Range Status   MRSA by PCR Next Gen NOT DETECTED NOT DETECTED Final    Comment: (NOTE) The GeneXpert MRSA Assay (FDA approved for NASAL specimens only), is one component of a comprehensive MRSA colonization surveillance program. It is not intended to diagnose MRSA infection nor to guide or monitor treatment for MRSA infections. Test performance is not FDA approved in patients less than 4 years old. Performed at [facility], [address], [place], [place] [address]          Radiology Studies: DG CHEST PORT 1 VIEW  Result Date: 10/08/2022 CLINICAL DATA:  Status post CPR EXAM: PORTABLE CHEST 1 VIEW COMPARISON:  Chest radiograph dated 10/07/2022 FINDINGS: Lines/tubes: Right internal jugular venous catheter tip projects over the right atrium. Lungs: Low lung volumes with increased bilateral interstitial and left-greater-than-right patchy opacities. Pleura: Small bilateral pleural effusions.  No pneumothorax. Heart/mediastinum: Similar enlarged cardiomediastinal silhouette. Surgical clips project over the upper right mediastinum. Bones: Unchanged right lateral second and third rib fractures. Additional rib fractures are better evaluated on prior CT. IMPRESSION: 1. Low lung volumes with increased bilateral interstitial and left-greater-than-right patchy opacities, which may represent a combination of pulmonary edema, atelectasis, or superimposed aspiration/pneumonia. 2. Small bilateral pleural effusions. Electronically Signed   By: Agustin Cree M.D.   On: 10/08/2022 09:06           LOS: 3 days   Time spent= 35 mins     Joline Maxcy, MD [facility]  If 7PM-7AM, please contact night-coverage  10/10/2022, 7:44 AM

## 2022-10-11 ENCOUNTER — Inpatient Hospital Stay (HOSPITAL_COMMUNITY): Payer: Medicare Other

## 2022-10-11 DIAGNOSIS — R7881 Bacteremia: Secondary | ICD-10-CM | POA: Diagnosis not present

## 2022-10-11 DIAGNOSIS — B961 Klebsiella pneumoniae [K. pneumoniae] as the cause of diseases classified elsewhere: Secondary | ICD-10-CM | POA: Diagnosis not present

## 2022-10-11 DIAGNOSIS — I469 Cardiac arrest, cause unspecified: Secondary | ICD-10-CM | POA: Diagnosis not present

## 2022-10-11 LAB — BASIC METABOLIC PANEL WITH GFR
Anion gap: 8 (ref 5–15)
BUN: 8 mg/dL (ref 8–23)
CO2: 28 mmol/L (ref 22–32)
Calcium: 7.5 mg/dL — ABNORMAL LOW (ref 8.9–10.3)
Chloride: 95 mmol/L — ABNORMAL LOW (ref 98–111)
Creatinine, Ser: 1.17 mg/dL — ABNORMAL HIGH (ref 0.44–1.00)
GFR, Estimated: 47 mL/min — ABNORMAL LOW (ref >60)
Glucose, Bld: 239 mg/dL — ABNORMAL HIGH (ref 70–99)
Potassium: 3.3 mmol/L — ABNORMAL LOW (ref 3.5–5.1)
Sodium: 131 mmol/L — ABNORMAL LOW (ref 135–145)

## 2022-10-11 LAB — GLUCOSE, CAPILLARY
Glucose-Capillary: 185 mg/dL — ABNORMAL HIGH (ref 70–99)
Glucose-Capillary: 119 mg/dL — ABNORMAL HIGH (ref 70–99)
Glucose-Capillary: 198 mg/dL — ABNORMAL HIGH (ref 70–99)
Glucose-Capillary: 206 mg/dL — ABNORMAL HIGH (ref 70–99)

## 2022-10-11 LAB — CBC
HCT: 26.9 % — ABNORMAL LOW (ref 36.0–46.0)
Hemoglobin: 8.6 g/dL — ABNORMAL LOW (ref 12.0–15.0)
MCH: 28.3 pg (ref 26.0–34.0)
MCHC: 32 g/dL (ref 30.0–36.0)
MCV: 88.5 fL (ref 80.0–100.0)
Platelets: 288 10*3/uL (ref 150–400)
RBC: 3.04 MIL/uL — ABNORMAL LOW (ref 3.87–5.11)
RDW: 18.7 % — ABNORMAL HIGH (ref 11.5–15.5)
WBC: 11.3 10*3/uL — ABNORMAL HIGH (ref 4.0–10.5)
nRBC: 0 % (ref 0.0–0.2)

## 2022-10-11 LAB — MAGNESIUM: Magnesium: 1.4 mg/dL — ABNORMAL LOW (ref 1.7–2.4)

## 2022-10-11 MED ORDER — POTASSIUM CHLORIDE CRYS ER 20 MEQ PO TBCR
40.0000 meq | EXTENDED_RELEASE_TABLET | Freq: Once | ORAL | Status: AC
  Administered 2022-10-11: 40 meq via ORAL
  Filled 2022-10-11: qty 2

## 2022-10-11 MED ORDER — MIDODRINE HCL 5 MG PO TABS
5.0000 mg | ORAL_TABLET | Freq: Three times a day (TID) | ORAL | Status: DC
  Administered 2022-10-12 – 2022-10-16 (×12): 5 mg via ORAL
  Filled 2022-10-11 (×15): qty 1

## 2022-10-11 MED ORDER — MIDODRINE HCL 5 MG PO TABS
10.0000 mg | ORAL_TABLET | ORAL | Status: DC
  Administered 2022-10-12 – 2022-10-16 (×2): 10 mg via ORAL
  Filled 2022-10-11: qty 2

## 2022-10-11 MED ORDER — MAGNESIUM SULFATE 2 GM/50ML IV SOLN
2.0000 g | Freq: Once | INTRAVENOUS | Status: AC
  Administered 2022-10-11: 2 g via INTRAVENOUS
  Filled 2022-10-11: qty 50

## 2022-10-11 MED ORDER — CHLORHEXIDINE GLUCONATE CLOTH 2 % EX PADS: 6.0000 | MEDICATED_PAD | Freq: Every day | CUTANEOUS | Status: DC

## 2022-10-11 MED ORDER — MIDODRINE HCL 5 MG PO TABS
5.0000 mg | ORAL_TABLET | Freq: Three times a day (TID) | ORAL | Status: DC
  Filled 2022-10-11: qty 1

## 2022-10-11 NOTE — Progress Notes (Addendum)
Pharmacy Antibiotic Note  [name] [name] is a 82 y.o. female admitted on 10/07/2022 with ESBL Klebsiella pneumoniae bacteremia.  Pharmacy has been consulted for Meropenem dosing. Patient has a history of ESBL E.coli UTI and ESBL Klebsiella pneumoniae bacteremia in 08/2022 and completed a course of meropenem. Patient also has ESRD, recently started on HD MWF 08/2022. Last HD was on 8/10.  Plan: Initiate Meropenem 500mg  IV Q24h, renally dosed  Monitor renal function, clinical status F/u ID LOT recommendations  Weight: 75.7 kg (166 lb 14.2 oz)  Temp (24hrs), Avg:98 F (36.7 C), Min:97.5 F (36.4 C), Max:98.7 F (37.1 C)  Recent Labs  Lab 10/07/22 1332 10/07/22 1529 10/07/22 1816 10/08/22 0555 10/09/22 0224 10/10/22 0407 10/11/22 0049  WBC 21.7*  --   --  13.0* 11.9* 13.2* 11.3*  CREATININE  --    < > 2.53* 2.85* 1.96* 1.30* 1.17*   < > = values in this interval not displayed.    Estimated Creatinine Clearance: 40.1 mL/min (A) (by C-G formula based on SCr of 1.17 mg/dL (H)).    Allergies  Allergen Reactions   Procaine Hcl Anaphylaxis   Amoxicillin Itching    Antimicrobials this admission: 8/7 Vancomycin x1 8/7 Cefepime x1  8/11 Meropenem >>   Microbiology results: 8/7 BCx >> ESBL Klebsiella pneumoniae 8/7 MRSA PCR >> negative  Thank you for allowing pharmacy to be a part of this patient's care.     10/11/2022 10:41 AM

## 2022-10-11 NOTE — Progress Notes (Addendum)
[facility] Progress Note  Subjective: seen in room, no c/o's.    Vitals:   10/11/22 0600 10/11/22 0700 10/11/22 0811 10/11/22 0843  BP:   (!) 134/58   Pulse: 69 68 74 80  Resp: 18 14 19 20   Temp:   98.4 F (36.9 C)   TempSrc:   Oral   SpO2: 95% 96% 98% 100%  Weight:        Exam: Gen alert, no distress, elderly lady responsive Sclera anicteric, throat clear  No jvd or bruits Chest clear bilat to bases RRR no MRG Abd soft ntnd no mass or ascites +bs obese Ext 2+ bilat LE edema Neuro is alert, Ox 3 , nf        Home meds include - alendronate, amiodarone, apixaban, atrovastatin, ezetimibe, insulin nph-regular, MVI, nystatin, pantoprazole, miralax, vit D, hydrocortisone cream 2.5%        OP HD: MWF SW  3.5h   400/600   78kg  2/2 bath  RIJ TDC  Heparin none - last OP HD 8/05, post wt 78.1, only 1.8kg removed - started OP HD around 7/26 - venofer 100mg  three times per week IV w hd thru 8/28     Assessment/ Plan: Cardiac arrest - occurred during OP HD session. 4 rounds of CPR. Long hx of systolic HF and recently started on dialysis. Seen by cardiology.  Volume overload - marked volume overload/ edema and pulm edema by CXR on admission. After HD x 3 she is down 9kg from admission and is 2kg under dry wt. Has another 5- 10kg on probably. Repeat CXR to make sure edema has cleared. Cont to lower volume 2.5- 3 L w/ each session.  GNR bacteremia - blood cx's from 8/07 just came back + for Klebsiella pna. Hx of ESBL in the past as well. ID consulted and the line will have to come out. Will consult IR for removal post HD tomorrow.  ESRD - on HD MWF. Has HD 3 daily HD sessions here 8/8 thru 8/10. Next HD Monday on schedule.  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 Phos is low, holding any binders for now. Follow.  HFrEF - echo showed LVEF 25-30%.  BP's too low for GDMT.  Chronic atrial fib - per cards/ CCM. On IV heparin for now.  IDDM     Vinson Moselle MD  [facility] 10/11/2022, 11:50 AM  Recent Labs  Lab 10/09/22 0224 10/09/22 1757 10/10/22 0407 10/11/22 0049  HGB 6.4*   < > 9.5* 8.6*  ALBUMIN 2.0*  --  1.9*  --   CALCIUM 7.8*  --  7.9* 7.5*  PHOS 2.3*  --  1.8*  --   CREATININE 1.96*  --  1.30* 1.17*  K 3.5  --  3.6 3.3*   < > = values in this interval not displayed.   Recent Labs  Lab 10/09/22 0555  IRON 17*  TIBC 119*  FERRITIN 269   Inpatient medications:  sodium chloride   Intravenous Once   amiodarone  200 mg Oral Daily   apixaban  2.5 mg Oral BID   Chlorhexidine Gluconate Cloth  6 each Topical Q0600   dextrose  1 ampule Intravenous Once   insulin aspart  0-15 Units Subcutaneous TID WC   insulin aspart  0-5 Units Subcutaneous QHS   midodrine  5 mg Oral TID    sodium chloride 10 mL/hr at 10/11/22 0700   meropenem (MERREM) IV Stopped (10/11/22 0038)   acetaminophen, docusate sodium, fentaNYL (SUBLIMAZE) injection, guaiFENesin, hydrALAZINE, ipratropium-albuterol, lidocaine, metoprolol tartrate, ondansetron (ZOFRAN) IV, oxyCODONE, polyethylene glycol, senna-docusate, traZODone

## 2022-10-11 NOTE — Consult Note (Addendum)
[facility]    Date of Admission:  10/07/2022   Total days of inpatient antibiotics 4        Reason for Consult: Kleb pnemo bactermia    Principal Problem:   Cardiac arrest [facility]) Active Problems:   Severe obesity (BMI >= 40) (HCC)   Diabetes (HCC)   Acute systolic CHF (congestive heart failure) (HCC)   Atrial fibrillation with rapid ventricular response (HCC)   Anemia in chronic kidney disease (CKD)   ESRD on dialysis (HCC)   Hypokalemia   Cardiac arrest, cause unspecified [facility])   Assessment: 82 year old female with CHF, ESRD recently started hemodialysis last month with temp HD catheter, A-fib, diabetes mellitus admitted with cardiac arrest versus syncopal episode required CPR obtain ROSC ID engaged due to blood cultures growing Klebsiella pneumoniae:  #Klebsiella pneumonia bacteremia #ESRD on HD #Cardiac arrest - 8/7 blood cultures grew Klebsiella pneumonia, ESBL on BC ID.  Patient is on meropenem. - She denies any urinary symptoms, prior to admission or now.  A/unlikely urinary source.-She denies that her HD cath is bothering her.  But she did develop cardiac arrest de about 10 minutes into hemodialysis from record review open multifactorial given EF of 25%, electrolyte abnormalities as well. - I suspect port of entry of bacteremia is likely HD catheter.  She has no other complaints.  Recommendations:  -Continue meropenem, can transition to ertapenem on discharge - Remove HD catheter, will need 72-hour line holiday.  Please obtain blood cultures after HD catheter removal - Plan on 2 weeks antibiotics from HD catheter removal for line infection. - ID will follow peripherally  #Poorly controlled diabetes - A1c 9.7 on 09/06/2022  #A-fib with RVR - Patient is on amiodarone and Eliquis Microbiology:   Antibiotics: Vancomycin and cefepime 8/7 Meropenem 8/10-present  Cultures: Blood 1/1 Klebsiella pneumonia, ESBL on BSC ID    HPI:  [name] [name] is a 82 y.o. female left hip postop wound followed by infectious disease back in 2017 treated with 6 weeks of antibiotic therapy 4 weeks of ceftriaxone and vancomycin then 2 weeks except amoxicillin she did develop pruritus likely secondary to amoxicillin, heart failure with reduced ejection fraction, CKD with progression to ESRD requiring HD where it catheter presented after cardiac arrest for about 10 minutes into her HD session received CPR and had ROSC.  Intubated in the ED.  Initially started on vancomycin and cefepime.  Patient extubated antibiotics stopped.  Restarted on meropenem once blood cultures on admission grew MDR Klebsiella pneumonia.  ID engaged for antibiotic recommendations.  Review of Systems: Review of Systems  All other systems reviewed and are negative.   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   HYPERLIPIDEMIA 03/27/2006   HYPERTENSION 03/27/2006   Intertrochanteric fracture of right hip (HCC) 07/13/2012   LUNG NODULE 09/01/2006   Excision, Bx Benighn   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"   Osteopenia 2004  Dexa 2004 showed Osteopenia, DEXA 03/2007 normal   Osteopenia    Other chronic cystitis with hematuria    Pelvic fracture (HCC) 06/16/2016   S/P fall   Recurrent urinary tract infection    Seeing Urology   RETINOPATHY, BACKGROUND NOS 03/27/2006   Seizures (HCC)    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    Social History   Tobacco Use   Smoking status: Former    Current packs/day: 0.00     Types: Cigarettes    Quit date: 03/02/1978    Years since quitting: 44.6   Smokeless tobacco: Never  Vaping Use   Vaping status: Never Used  Substance Use Topics   Alcohol use: No    Alcohol/week: 0.0 standard drinks of alcohol   Drug use: No    Family History  Problem Relation Age of Onset   Diabetes Father    Heart attack Father 32   Diabetes Sister    Drug abuse Son    Cancer Neg Hx        no hx of colon or breast cancer   Scheduled Meds:  sodium chloride   Intravenous Once   amiodarone  200 mg Oral Daily   apixaban  2.5 mg Oral BID   Chlorhexidine Gluconate Cloth  6 each Topical Q0600   [START ON 10/12/2022] Chlorhexidine Gluconate Cloth  6 each Topical Q0600   dextrose  1 ampule Intravenous Once   insulin aspart  0-15 Units Subcutaneous TID WC   insulin aspart  0-5 Units Subcutaneous QHS   [START ON 10/12/2022] midodrine  10 mg Oral Q M,W,F-HD   midodrine  5 mg Oral TID   Continuous Infusions:  sodium chloride 10 mL/hr at 10/11/22 0700   meropenem (MERREM) IV 500 mg (10/11/22 1606)   PRN Meds:.acetaminophen, docusate sodium, fentaNYL (SUBLIMAZE) injection, guaiFENesin, hydrALAZINE, ipratropium-albuterol, lidocaine, metoprolol tartrate, ondansetron (ZOFRAN) IV, oxyCODONE, polyethylene glycol, senna-docusate, traZODone Allergies  Allergen Reactions   Procaine Hcl Anaphylaxis   Amoxicillin Itching    OBJECTIVE: Blood pressure (!) 136/50, pulse 75, temperature 98.4 F (36.9 C), temperature source Oral, resp. rate 19, weight 75.7 kg, SpO2 95%.  Physical Exam Constitutional:      Appearance: Normal appearance.  HENT:     Head: Normocephalic and atraumatic.     Right Ear: Tympanic membrane normal.     Left Ear: Tympanic membrane normal.     Nose: Nose normal.     Mouth/Throat:     Mouth: Mucous membranes are moist.  Eyes:     Extraocular Movements: Extraocular movements intact.     Conjunctiva/sclera: Conjunctivae normal.     Pupils: Pupils are equal, round,  and reactive to light.  Cardiovascular:     Rate and Rhythm: Normal rate and regular rhythm.     Heart sounds: No murmur heard.    No friction rub. No gallop.     Comments: R chest HD cath Pulmonary:     Effort: Pulmonary effort is normal.     Breath sounds: Normal breath sounds.  Abdominal:     General: Abdomen is flat.     Palpations: Abdomen is soft.  Musculoskeletal:        General: Normal range of motion.  Skin:    General: Skin is warm and dry.  Neurological:     General: No focal deficit present.     Mental Status: She is alert and oriented to person, place, and time.  Psychiatric:        Mood and Affect: Mood  normal.     Lab Results Lab Results  Component Value Date   WBC 11.3 (H) 10/11/2022   HGB 8.6 (L) 10/11/2022   HCT 26.9 (L) 10/11/2022   MCV 88.5 10/11/2022   PLT 288 10/11/2022    Lab Results  Component Value Date   CREATININE 1.17 (H) 10/11/2022   BUN 8 10/11/2022   NA 131 (L) 10/11/2022   K 3.3 (L) 10/11/2022   CL 95 (L) 10/11/2022   CO2 28 10/11/2022    Lab Results  Component Value Date   ALT 9 10/10/2022   AST 19 10/10/2022   ALKPHOS 91 10/10/2022   BILITOT 1.1 10/10/2022       Danelle Earthly, MD [facility] 10/11/2022, 5:55 PM   I have personally spent 84 minutes involved in face-to-face and non-face-to-face activities for this patient on the day of the visit. Professional time spent includes the following activities: Preparing to see the patient (review of tests), Obtaining and/or reviewing separately obtained history (admission/discharge record), Performing a medically appropriate examination and/or evaluation , Ordering medications/tests/procedures, referring and communicating with other health care professionals, Documenting clinical information in the EMR, Independently interpreting results (not separately reported), Communicating results to the patient/family/caregiver, Counseling and  educating the patient/family/caregiver and Care coordination (not separately reported).

## 2022-10-11 NOTE — Progress Notes (Signed)
TRH night cross cover note:   I was notified by RN of the patient's potassium level of 3.3 as well as magnesium level of 1.4 this morning.  I subsequently ordered potassium chloride 40 meq p.o. x 1 dose as well as magnesium sulfate 2 g IV over 2 hours.    Newton Pigg, DO Hospitalist

## 2022-10-11 NOTE — Progress Notes (Addendum)
PROGRESS NOTE    [name] [name]  DUK:[id number] DOB: May 26, 1940 DOA: 10/07/2022 PCP: [name], MD   Brief Narrative:  82/F with chronic systolic CHF, ESRD recently started on hemodialysis last month, P atrial fibrillation, diabetes, CVA.  Recently residing at SNF/rehab.  She experienced a syncopal episode versus cardiac arrest while she was initiating hemodialysis on 8/7.  She received 8 minutes of CPR, no epinephrine.  Was not intubated, CT head reassuring, CT chest noted nondisplaced multiple rib fractures.  Patient transmitted required norepinephrine.  Echo showed chronic systolic CHF with EF 25% and seen by cardiology.  No further workup.  Transferred out of the ICU on 8/9.  Patient has been tolerating medications well, blood pressure is low but more stable therefore reducing midodrine.  PT/OT evaluation pending    Assessment & Plan:  Principal Problem:   Cardiac arrest [facility]) Active Problems:   Severe obesity (BMI >= 40) (HCC)   Diabetes (HCC)   Acute systolic CHF (congestive heart failure) (HCC)   Atrial fibrillation with rapid ventricular response (HCC)   Anemia in chronic kidney disease (CKD)   ESRD on dialysis (HCC)   Hypokalemia   Cardiac arrest, cause unspecified (HCC)     Cardiac arrest Unclear if this was syncopal episode versus cardiac arrest.  ROSC was achieved in 8 minutes.  Echocardiogram shows EF of 25% with moderately elevated PA pressures and RV was not well-visualized.  Seen by cardiology, no further workup indicated at this time. CT of the head shows left temporal lobe encephalomalacia which is chronic otherwise no acute pathology  Bacteremia, GNR.  Klebsiella - Overnight cultures became positive for gram-negative rod bacteremia.  Patient does have hemodialysis tunneled catheter in place.  Empiric meropenem started, previous cultures has grown ESBL.  I will consult infectious disease.  Patient had similar infection last month treated with IV antibiotics  thereafter temporary HD catheter was switched to tunneled catheter.  ADDENDUM AT 5PM: Discussed with ID and Nephro. Will need HD catheter out after HD tomorrow.    Chronic hypotension -Multifactorial in the setting of ESRD, systolic CHF and severe hypoalbuminemia.  Was transiently on IV pressors now off.  Reduce midodrine to 5 mg 3 times daily  Nondisplaced multiple rib fractures - Secondary to chest compressions.  Will continue to monitor.  Pain control as necessary.  Allow time to self heal.  Lidocaine patch as needed.   Volume overload ESRD Severe hypoalbuminemia Nephrology team is following.  Received albumin.   Chronic systolic heart failure Seen by cardiology team.  Echo shows EF of 25%.  No further workup at this time   A-fib with RVR RVR has resolved, continue amiodarone and Eliquis   Hyponatremia Overall sodium levels are stable around 130   Hypokalemia/hypomagnesemia Replete as needed   Chronic anemia Hemoglobin stable around 9.5..  It did drop less than 7.0 requiring 1 unit PRBC transfusion on 8/9.   Insulin-dependent diabetes Sliding scale and Accu-Cheks.   Debility, frailty PT/OT-pending.  Given advanced age, multiple comorbidities, palliative care team consulted.     DVT prophylaxis:apixaban Code Status: DNR Family Communication: No family at bedside Disposition Plan: Ongoing evaluation for recurrent Klebsiella bacteremia.  ID consulted.   Pressure Injury 10/07/22 Sacrum Posterior;Mid Stage 1 -  Intact skin with non-blanchable redness of a localized area usually over a bony prominence. (Active)  10/07/22 1730  Location: Sacrum  Location Orientation: Posterior;Mid  Staging: Stage 1 -  Intact skin with non-blanchable redness of a localized area usually over  a bony prominence.  Wound Description (Comments):   Present on Admission: Yes     Diet Orders (From admission, onward)     Start     Ordered   10/08/22 0845  Diet renal with fluid restriction  Fluid restriction: 1200 mL Fluid; Room service appropriate? Yes; Fluid consistency: Thin  Diet effective now       Question Answer Comment  Fluid restriction: 1200 mL Fluid   Room service appropriate? Yes   Fluid consistency: Thin      10/08/22 0844            Subjective: Patient feels well but overnight her blood cultures became positive for Klebsiella bacteremia, recent history of ESBL.   Examination: Constitutional: Not in acute distress Respiratory: Clear to auscultation bilaterally Cardiovascular: Normal sinus rhythm, no rubs Abdomen: Nontender nondistended good bowel sounds Musculoskeletal: No edema noted Skin: No rashes seen Neurologic: CN 2-12 grossly intact.  And nonfocal Psychiatric: Normal judgment and insight. Alert and oriented x 3. Normal mood.  Right IJ tunnel catheter for HD Objective: Vitals:   10/11/22 0426 10/11/22 0500 10/11/22 0600 10/11/22 0700  BP: (!) 140/62     Pulse: 71 65 69 68  Resp: 14 14 18 14   Temp: 97.7 F (36.5 C)     TempSrc: Oral     SpO2: 96% 97% 95% 96%  Weight: 75.7 kg       Intake/Output Summary (Last 24 hours) at 10/11/2022 0743 Last data filed at 10/11/2022 0700 Gross per 24 hour  Intake 1044.03 ml  Output 5243.28 ml  Net -4199.25 ml   Filed Weights   10/10/22 1315 10/10/22 1705 10/11/22 0426  Weight: 78.6 kg 76 kg 75.7 kg    Scheduled Meds:  sodium chloride   Intravenous Once   amiodarone  200 mg Oral Daily   apixaban  2.5 mg Oral BID   Chlorhexidine Gluconate Cloth  6 each Topical Q0600   dextrose  1 ampule Intravenous Once   insulin aspart  0-15 Units Subcutaneous TID WC   insulin aspart  0-5 Units Subcutaneous QHS   midodrine  10 mg Oral TID   Continuous Infusions:  sodium chloride 10 mL/hr at 10/11/22 0700   meropenem (MERREM) IV Stopped (10/11/22 0038)    Nutritional status     Body mass index is 24.65 kg/m.  Data Reviewed:   CBC: Recent Labs  Lab 10/07/22 1332 10/08/22 0555 10/09/22 0224  10/09/22 1757 10/10/22 0407 10/11/22 0049  WBC 21.7* 13.0* 11.9*  --  13.2* 11.3*  NEUTROABS 16.0*  --   --   --   --   --   HGB 8.1* 7.3* 6.4* 9.1* 9.5* 8.6*  HCT 26.3* 23.5* 20.4* 28.4* 28.5* 26.9*  MCV 92.6 91.8 93.6  --  89.3 88.5  PLT 278 267 273  --  295 288   Basic Metabolic Panel: Recent Labs  Lab 10/07/22 1529 10/07/22 1816 10/08/22 0555 10/09/22 0224 10/10/22 0407 10/11/22 0049  NA 131* 130* 131* 130* 132* 131*  K 2.8* 3.0* 4.2 3.5 3.6 3.3*  CL 94* 92* 95* 94* 97* 95*  CO2 28 27 27 26 25 28   GLUCOSE 62* 103* 78 98 90 239*  BUN 15 16 19 13 9 8   CREATININE 2.54* 2.53* 2.85* 1.96* 1.30* 1.17*  CALCIUM 8.2* 7.7* 7.9* 7.8* 7.9* 7.5*  MG 1.6*  --  1.8 1.9  --  1.4*  PHOS 2.1*  --  2.9 2.3* 1.8*  --    GFR:  Estimated Creatinine Clearance: 40.1 mL/min (A) (by C-G formula based on SCr of 1.17 mg/dL (H)). Liver Function Tests: Recent Labs  Lab 10/07/22 1529 10/07/22 1816 10/08/22 0555 10/09/22 0224 10/10/22 0407  AST 32 28  --   --  19  ALT 14 12  --   --  9  ALKPHOS 86 77  --   --  91  BILITOT 0.8 0.8  --   --  1.1  PROT 5.5* 4.8*  --   --  5.6*  ALBUMIN <1.5* <1.5* <1.5* 2.0* 1.9*   No results for input(s): "LIPASE", "AMYLASE" in the last 168 hours. No results for input(s): "AMMONIA" in the last 168 hours. Coagulation Profile: Recent Labs  Lab 10/07/22 1816  INR 2.3*   Cardiac Enzymes: No results for input(s): "CKTOTAL", "CKMB", "CKMBINDEX", "TROPONINI" in the last 168 hours. BNP (last 3 results) No results for input(s): "PROBNP" in the last 8760 hours. HbA1C: No results for input(s): "HGBA1C" in the last 72 hours. CBG: Recent Labs  Lab 10/10/22 0733 10/10/22 1208 10/10/22 1711 10/10/22 2111 10/11/22 0628  GLUCAP 99 165* 111* 190* 185*   Lipid Profile: No results for input(s): "CHOL", "HDL", "LDLCALC", "TRIG", "CHOLHDL", "LDLDIRECT" in the last 72 hours. Thyroid Function Tests: No results for input(s): "TSH", "T4TOTAL", "FREET4", "T3FREE",  "THYROIDAB" in the last 72 hours. Anemia Panel: Recent Labs    10/09/22 0223 10/09/22 0555  VITAMINB12  --  744  FOLATE  --  12.0  FERRITIN  --  269  TIBC  --  119*  IRON  --  17*  RETICCTPCT 3.8*  --    Sepsis Labs: No results for input(s): "PROCALCITON", "LATICACIDVEN" in the last 168 hours.  Recent Results (from the past 240 hour(s))  Blood culture (routine x 2)     Status: None (Preliminary result)   Collection Time: 10/07/22  3:29 PM   Specimen: BLOOD RIGHT ARM  Result Value Ref Range Status   Specimen Description BLOOD RIGHT ARM  Final   Special Requests NONE  Final   Culture  Setup Time   Final    GRAM NEGATIVE RODS AEROBIC BOTTLE ONLY CRITICAL RESULT CALLED TO, READ BACK BY AND VERIFIED WITH: PHARMD G. ABBOTT 10/10/22 @ 2224 BY AB Performed at [facility], [address], [place], [place] [address]    Culture GRAM NEGATIVE RODS  Final   Report Status PENDING  Incomplete  Blood Culture ID Panel (Reflexed)     Status: Abnormal   Collection Time: 10/07/22  3:29 PM  Result Value Ref Range Status   Enterococcus faecalis NOT DETECTED NOT DETECTED Final   Enterococcus Faecium NOT DETECTED NOT DETECTED Final   Listeria monocytogenes NOT DETECTED NOT DETECTED Final   Staphylococcus species NOT DETECTED NOT DETECTED Final   Staphylococcus aureus (BCID) NOT DETECTED NOT DETECTED Final   Staphylococcus epidermidis NOT DETECTED NOT DETECTED Final   Staphylococcus lugdunensis NOT DETECTED NOT DETECTED Final   Streptococcus species NOT DETECTED NOT DETECTED Final   Streptococcus agalactiae NOT DETECTED NOT DETECTED Final   Streptococcus pneumoniae NOT DETECTED NOT DETECTED Final   Streptococcus pyogenes NOT DETECTED NOT DETECTED Final   A.calcoaceticus-baumannii NOT DETECTED NOT DETECTED Final   Bacteroides fragilis NOT DETECTED NOT DETECTED Final   Enterobacterales DETECTED (A) NOT DETECTED Final    Comment: Enterobacterales represent a large order of gram negative  bacteria, not a single organism. CRITICAL RESULT CALLED TO, READ BACK BY AND VERIFIED WITH: PHARMD G. ABBOTT 10/10/22 @ 2224  BY AB    Enterobacter cloacae complex NOT DETECTED NOT DETECTED Final   Escherichia coli NOT DETECTED NOT DETECTED Final   Klebsiella aerogenes NOT DETECTED NOT DETECTED Final   Klebsiella oxytoca NOT DETECTED NOT DETECTED Final   Klebsiella pneumoniae DETECTED (A) NOT DETECTED Final    Comment: CRITICAL RESULT CALLED TO, READ BACK BY AND VERIFIED WITH: PHARMD G. ABBOTT 10/10/22 @ 2224 BY AB    Proteus species NOT DETECTED NOT DETECTED Final   Salmonella species NOT DETECTED NOT DETECTED Final   Serratia marcescens NOT DETECTED NOT DETECTED Final   Haemophilus influenzae NOT DETECTED NOT DETECTED Final   Neisseria meningitidis NOT DETECTED NOT DETECTED Final   Pseudomonas aeruginosa NOT DETECTED NOT DETECTED Final   Stenotrophomonas maltophilia NOT DETECTED NOT DETECTED Final   Candida albicans NOT DETECTED NOT DETECTED Final   Candida auris NOT DETECTED NOT DETECTED Final   Candida glabrata NOT DETECTED NOT DETECTED Final   Candida krusei NOT DETECTED NOT DETECTED Final   Candida parapsilosis NOT DETECTED NOT DETECTED Final   Candida tropicalis NOT DETECTED NOT DETECTED Final   Cryptococcus neoformans/gattii NOT DETECTED NOT DETECTED Final   CTX-M ESBL DETECTED (A) NOT DETECTED Final    Comment: CRITICAL RESULT CALLED TO, READ BACK BY AND VERIFIED WITH: PHARMD G. ABBOTT 10/10/22 @ 2224 BY AB (NOTE) Extended spectrum beta-lactamase detected. Recommend a carbapenem as initial therapy.      Carbapenem resistance IMP NOT DETECTED NOT DETECTED Final   Carbapenem resistance KPC NOT DETECTED NOT DETECTED Final   Carbapenem resistance NDM NOT DETECTED NOT DETECTED Final   Carbapenem resist OXA 48 LIKE NOT DETECTED NOT DETECTED Final   Carbapenem resistance VIM NOT DETECTED NOT DETECTED Final    Comment: Performed at [facility], [address]  [place], [place] [address]  MRSA Next Gen by PCR, Nasal     Status: None   Collection Time: 10/07/22  5:19 PM   Specimen: Nasal Mucosa; Nasal Swab  Result Value Ref Range Status   MRSA by PCR Next Gen NOT DETECTED NOT DETECTED Final    Comment: (NOTE) The GeneXpert MRSA Assay (FDA approved for NASAL specimens only), is one component of a comprehensive MRSA colonization surveillance program. It is not intended to diagnose MRSA infection nor to guide or monitor treatment for MRSA infections. Test performance is not FDA approved in patients less than 60 years old. Performed at [facility], [address] [place], [place] [address]          Radiology Studies: No results found.         LOS: 4 days   Time spent= 35 mins     Joline Maxcy, MD [facility]  If 7PM-7AM, please contact night-coverage  10/11/2022, 7:43 AM

## 2022-10-12 ENCOUNTER — Inpatient Hospital Stay (HOSPITAL_COMMUNITY): Payer: Medicare Other

## 2022-10-12 DIAGNOSIS — N186 End stage renal disease: Secondary | ICD-10-CM | POA: Diagnosis not present

## 2022-10-12 DIAGNOSIS — Z992 Dependence on renal dialysis: Secondary | ICD-10-CM

## 2022-10-12 DIAGNOSIS — I469 Cardiac arrest, cause unspecified: Secondary | ICD-10-CM | POA: Diagnosis not present

## 2022-10-12 DIAGNOSIS — Z7189 Other specified counseling: Secondary | ICD-10-CM | POA: Diagnosis not present

## 2022-10-12 DIAGNOSIS — Z515 Encounter for palliative care: Secondary | ICD-10-CM | POA: Diagnosis not present

## 2022-10-12 DIAGNOSIS — Z66 Do not resuscitate: Secondary | ICD-10-CM

## 2022-10-12 HISTORY — PX: IR REMOVAL TUN CV CATH W/O FL: IMG2289

## 2022-10-12 LAB — GLUCOSE, CAPILLARY
Glucose-Capillary: 101 mg/dL — ABNORMAL HIGH (ref 70–99)
Glucose-Capillary: 78 mg/dL (ref 70–99)
Glucose-Capillary: 112 mg/dL — ABNORMAL HIGH (ref 70–99)
Glucose-Capillary: 163 mg/dL — ABNORMAL HIGH (ref 70–99)
Glucose-Capillary: 167 mg/dL — ABNORMAL HIGH (ref 70–99)

## 2022-10-12 MED ORDER — INSULIN ASPART 100 UNIT/ML IJ SOLN
0.0000 [IU] | Freq: Every day | INTRAMUSCULAR | Status: DC
  Administered 2022-10-15: 4 [IU] via SUBCUTANEOUS

## 2022-10-12 MED ORDER — INSULIN ASPART 100 UNIT/ML IJ SOLN
0.0000 [IU] | Freq: Three times a day (TID) | INTRAMUSCULAR | Status: DC
  Administered 2022-10-13: 2 [IU] via SUBCUTANEOUS
  Administered 2022-10-13: 1 [IU] via SUBCUTANEOUS
  Administered 2022-10-14: 3 [IU] via SUBCUTANEOUS
  Administered 2022-10-14 (×2): 1 [IU] via SUBCUTANEOUS
  Administered 2022-10-15: 3 [IU] via SUBCUTANEOUS
  Administered 2022-10-15: 1 [IU] via SUBCUTANEOUS
  Administered 2022-10-16: 2 [IU] via SUBCUTANEOUS
  Administered 2022-10-17: 1 [IU] via SUBCUTANEOUS

## 2022-10-12 MED ORDER — HEPARIN SODIUM (PORCINE) 1000 UNIT/ML IJ SOLN
INTRAMUSCULAR | Status: AC
  Administered 2022-10-12: 1000 [IU]
  Filled 2022-10-12: qty 1

## 2022-10-12 NOTE — Inpatient Diabetes Management (Signed)
Inpatient Diabetes Program Recommendations  AACE/ADA: New Consensus Statement on Inpatient Glycemic Control (2015)  Target Ranges:  Prepandial:   less than 140 mg/dL      Peak postprandial:   less than 180 mg/dL (1-2 hours)      Critically ill patients:  140 - 180 mg/dL   Lab Results  Component Value Date   GLUCAP 112 (H) 10/12/2022   HGBA1C 9.7 (H) 09/06/2022    Latest Reference Range & Units 10/11/22 06:28 10/11/22 11:13 10/11/22 15:43 10/11/22 21:20 10/12/22 06:33 10/12/22 09:42 10/12/22 12:49  Glucose-Capillary 70 - 99 mg/dL 811 (H) Novolog 3 units 119 (H) 198 (H) Novolog 3 units 206 (H) Novolog 2 units 101 (H) 78 112 (H)  (H): Data is abnormally high  Latest Reference Range & Units Most Recent  Glucose 70 - 99 mg/dL 38 (LL) 11/13/76 29:56  (LL): Data is critically low  Diabetes history: DM2 Outpatient Diabetes medications: 70/30 5 BID  Current orders for Inpatient glycemic control: Novolog 0-15 units tid, 0-5 units hs  Inpatient Diabetes Program Recommendations:   Patient had hypoglycemia post Novolog correction. Please consider: -Decrease Novolog correction to 0-6 units tid, 0-5 units hs  Thank you, Darel Hong E. , RN, MSN, CDE  Diabetes Coordinator Inpatient Glycemic Control Team Team Pager [phone] (8am-5pm) 10/12/2022 2:30 PM

## 2022-10-12 NOTE — Progress Notes (Signed)
[facility] lab called HD unit with a critical Lab of blood glucose level of 38. HD RN recheck BS and results was 78 and given Apple Juice and pt remains asyptomatic.

## 2022-10-12 NOTE — Progress Notes (Signed)
Daily Progress Note   Patient Name: [name] [name]       Date: 10/12/2022 DOB: Dec 10, 1940  Age: 82 y.o. MRN#: [id number] Attending Physician: Marcelino Duster, MD Primary Care Physician: [name], MD Admit Date: 10/07/2022  Reason for Consultation/Follow-up: Establishing goals of care  Subjective: Patient very upset upon my entry - yelling - she is hungry and had dialysis this morning and is now going down for HD cath removal  Length of Stay: 5  Current Medications: Scheduled Meds:   sodium chloride   Intravenous Once   amiodarone  200 mg Oral Daily   apixaban  2.5 mg Oral BID   Chlorhexidine Gluconate Cloth  6 each Topical Q0600   Chlorhexidine Gluconate Cloth  6 each Topical Q0600   dextrose  1 ampule Intravenous Once   insulin aspart  0-15 Units Subcutaneous TID WC   insulin aspart  0-5 Units Subcutaneous QHS   midodrine  10 mg Oral Q M,W,F-HD   midodrine  5 mg Oral TID    Continuous Infusions:  sodium chloride 10 mL/hr at 10/11/22 0700   meropenem (MERREM) IV 500 mg (10/11/22 1606)    PRN Meds: acetaminophen, docusate sodium, fentaNYL (SUBLIMAZE) injection, guaiFENesin, hydrALAZINE, ipratropium-albuterol, lidocaine, metoprolol tartrate, ondansetron (ZOFRAN) IV, oxyCODONE, polyethylene glycol, senna-docusate, traZODone  Physical Exam Constitutional:      General: She is not in acute distress.    Appearance: She is ill-appearing.  Pulmonary:     Effort: Pulmonary effort is normal.  Skin:    General: Skin is warm and dry.  Neurological:     Mental Status: She is alert and oriented to person, place, and time.     Comments: Easily confused  Psychiatric:     Comments: Angry/yelling             Vital Signs: BP 120/83 (BP Location: Left Arm)   Pulse 72   Temp (!) 97.5  F (36.4 C) (Oral)   Resp 20   Wt 76.3 kg   SpO2 97%   BMI 24.84 kg/m  SpO2: SpO2: 97 % O2 Device: O2 Device: Room Air O2 Flow Rate: O2 Flow Rate (L/min): 2 L/min  Intake/output summary:  Intake/Output Summary (Last 24 hours) at 10/12/2022 1313 Last data filed at 10/12/2022 1147 Gross per 24 hour  Intake 237 ml  Output 1920 ml  Net -1683 ml   LBM: Last BM Date : 10/07/22 Baseline Weight: Weight: 79.6 kg Most recent weight: Weight: 76.3 kg       Palliative Assessment/Data: PPS 40%      Patient Active Problem List   Diagnosis Date Noted   Cardiac arrest (HCC) 10/07/2022   ESRD on dialysis (HCC) 10/07/2022   Hypokalemia 10/07/2022   Cardiac arrest, cause unspecified (HCC) 10/07/2022   Hyperkalemia 09/11/2022   Anemia in chronic kidney disease (CKD) 09/09/2022   Chronic systolic CHF (congestive heart failure) (HCC) 09/09/2022   ESBL Bacteremia due to UTI 09/07/2022   Acute CVA (cerebrovascular accident) (HCC) 09/06/2022   A-fib (HCC) 08/04/2022   Atrial fibrillation with rapid ventricular response (HCC) 08/03/2022   Acute renal failure superimposed on stage 4 chronic kidney disease (HCC) 07/20/2022   Supratherapeutic INR 07/08/2022  Hemorrhagic cystitis 07/08/2022   Gross hematuria 07/07/2022   CKD (chronic kidney disease) stage 4, GFR 15-29 ml/min (HCC) 04/10/2020   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   Osteoporosis 07/22/2018   Closed compression fracture of L1 vertebra (HCC) 07/19/2018   Cardiomyopathy (HCC) 04/07/2018   Foot ulcer (HCC) 07/24/2016   Gram-negative infection    CKD (chronic kidney disease) stage 3, GFR 30-59 ml/min (HCC)    Postoperative anemia due to acute blood loss 01/04/2016   Left hip postoperative wound infection 01/02/2016   Paroxysmal atrial fibrillation (HCC) 05/20/2015   CAD S/P percutaneous coronary angioplasty 05/20/2015   Acute systolic CHF (congestive heart failure) (HCC) 05/20/2015   Acute kidney injury superimposed on  chronic kidney disease (HCC) 05/11/2015   PCP NOTES >>>>>>>>>>>>>>>> 12/28/2014   Memory loss 05/17/2014   Diabetes (HCC) 01/23/2014   Anxiety and depression, PCP notes  11/24/2013   Severe obesity (BMI >= 40) (HCC) 07/14/2013   Encounter for therapeutic drug monitoring 06/02/2013   History of encephalitis 11/18/2012   Intertrochanteric fracture of right hip (HCC) 07/13/2012   GERD (gastroesophageal reflux disease) 10/05/2011   Seizure disorder (HCC) 05/24/2011   Hyponatremia 05/17/2011   Annual physical exam >>>>>>>>>>>>>>>>>>>>>>>>>>>>>>>.. 02/10/2011   LUNG NODULE 09/01/2006   Hyperlipidemia 03/27/2006   Hereditary and idiopathic peripheral neuropathy 03/27/2006   RETINOPATHY, BACKGROUND NOS 03/27/2006   Essential hypertension 03/27/2006    Palliative Care Assessment & Plan   HPI: 82 y.o. female  with past medical history of chronic systolic CHF, ESRD recently started on hemodialysis last month, P atrial fibrillation, diabetes, and CVA admitted on 10/07/2022 with cardiac arrest during dialysis.  Etiology of cardiac arrest unclear.  Patient with ongoing volume overload however hypotension limits volume removal.  She also has cardiomyopathy that complicates situation as well as severe hypoalbuminemia.  PMT consulted to discuss goals of care.   Of note, PMT was involved during last admission July 2024.  At that time decision was made for DNR but continue all other measures including dialysis.  Assessment: Meeting today to include patient and spouse. As above, patient very upset with HD and now procedure to remove HD catheter. We discuss the importance of this. I share with her and spouse that her blood cultures with positive for a bacteria and so this is the reason the line needs to be removed. Spouse expresses understanding and encourages patient to allow the team to take her down to have line removed. We discuss that she can eat after procedure.  All questions and concerns  addressed.  Per previous discussions, spouse and patient agree they want her to return to [facility] and continue with HD, hopeful she tolerates well. They understand concerns of repeated complications given her other illnesses.   Recommendations/Plan: Plans to continue w/current level of care - spouse hopeful for return to [facility], continue HD Will request outpatient palliative referral to follow at SNF  Code Status: DNR  Discharge Planning: Skilled Nursing Facility for rehab with Palliative care service follow-up  Care plan was discussed with patient and spouse  Thank you for allowing the Palliative Medicine Team to assist in the care of this patient.  *Please note that this is a verbal dictation therefore any spelling or grammatical errors are due to the "Dragon Medical One" system interpretation.  Gerlean Ren, DNP, [facility] Palliative Medicine Team Team Phone # [phone]  Pager [phone]

## 2022-10-12 NOTE — Progress Notes (Signed)
PHARMACY CONSULT NOTE FOR:  OUTPATIENT  PARENTERAL ANTIBIOTIC THERAPY (OPAT)  Informational only as the plan is for the patient to discharge back to SNF  Indication: ESBL bacteremia Regimen: Ertapenem 500 mg IV every 24 hours End date: 10/25/22 (2 weeks from [facility] removal on 8/12)  IV antibiotic discharge orders are pended. To discharging provider:  please sign these orders via discharge navigator,  Select New Orders & click on the button choice - Manage This Unsigned Work.     Thank you for allowing pharmacy to be a part of this patient's care.  Georgina Pillion, PharmD, BCPS, BCIDP Infectious Diseases Clinical Pharmacist 10/15/2022 2:51 PM   **Pharmacist phone directory can now be found on [email or web address] ([email or web address]).  Listed under [facility].

## 2022-10-12 NOTE — Progress Notes (Signed)
   10/12/22 1147  Vitals  Temp (!) 97.5 F (36.4 C)  Pulse Rate 65  Resp 15  BP (!) 130/59  SpO2 95 %  O2 Device Room Air  Oxygen Therapy  Patient Activity (if Appropriate) In bed  Pulse Oximetry Type Continuous  Oximetry Probe Site Changed No  Post Treatment  Dialyzer Clearance Lightly streaked  Duration of HD Treatment -hour(s) 3.5 hour(s)  Hemodialysis Intake (mL) 0 mL  Liters Processed 83.9  Fluid Removed (mL) 1970 mL  Tolerated HD Treatment Yes  Post-Hemodialysis Comments uf goal not met due to hypotension. patient asymptomatic   Received patient in bed to unit.  Alert and oriented.  Informed consent signed and in chart.   TX duration:3.5 hrs  Patient tolerated well.  Transported back to the room  Alert, without acute distress.  Hand-off given to patient's nurse.   Access used: [facility] Access issues: none  Total UF removed: 1970  Medication(s) given: none    [facility]

## 2022-10-12 NOTE — Progress Notes (Signed)
[facility] Progress Note  Subjective: tolerating HD without complaints.  Vitals:   10/12/22 0830 10/12/22 0900 10/12/22 0930 10/12/22 1000  BP: (!) 111/49 130/64 121/65 (!) 76/45  Pulse: (!) 47 78 74 77  Resp: 14 15 13 14   Temp:      TempSrc:      SpO2: 98% 96% 96% 97%  Weight:        Exam: GEN: lying in bed, nad ENT: no nasal discharge, mmm EYES: no scleral icterus, eomi CV: normal rate, no rub PULM: no iwob, bilateral chest rise ABD: NABS, non-distended SKIN: no rashes or jaundice EXT: 1+ edema, warm and well perfused     Home meds include - alendronate, amiodarone, apixaban, atrovastatin, ezetimibe, insulin nph-regular, MVI, nystatin, pantoprazole, miralax, vit D, hydrocortisone cream 2.5%        OP HD: MWF SW  3.5h   400/600   78kg  2/2 bath  RIJ TDC  Heparin none - last OP HD 8/05, post wt 78.1, only 1.8kg removed - started OP HD around 7/26 - venofer 100mg  three times per week IV w hd thru 8/28     Assessment/ Plan: Cardiac arrest - occurred during OP HD session. 4 rounds of CPR. Long hx of systolic HF and recently started on dialysis. Seen by cardiology.  Volume overload - marked volume overload/ edema and pulm edema by CXR on admission. After HD x 3 she is down 9kg from admission and is under edw. Continue with aggressive HD as tolerated. GNR bacteremia - blood cx's from 8/07 just came back + for Klebsiella pna. Hx of ESBL in the past as well. ID consulted and the line will have to come out. Consulted IR today for line removal. Goal 72 hour line holiday per ID ESRD - on HD MWF. HD today then goal 72 hr line holiday. Hypotension - chronic issue,  continues on midodrine Anemia esrd - Hb 8.1 , then dropped to 6s, got 1u prbcs and Hb now in 8-10 range MBD ckd - CCa is in range. Phos is low, holding any binders for now. Follow.  HFrEF - echo showed LVEF 25-30%. BP's too low for GDMT.  Chronic atrial fib - per cards/ CCM. On IV heparin for now.   IDDM    Darnell Level  10/12/2022, 10:11 AM  Recent Labs  Lab 10/09/22 0224 10/09/22 1757 10/10/22 0407 10/11/22 0049 10/12/22 0302  HGB 6.4*   < > 9.5* 8.6* 8.7*  ALBUMIN 2.0*  --  1.9*  --   --   CALCIUM 7.8*  --  7.9* 7.5* 7.9*  PHOS 2.3*  --  1.8*  --  3.6  CREATININE 1.96*  --  1.30* 1.17* 1.78*  K 3.5  --  3.6 3.3* 3.6   < > = values in this interval not displayed.   Recent Labs  Lab 10/09/22 0555  IRON 17*  TIBC 119*  FERRITIN 269   Inpatient medications:  sodium chloride   Intravenous Once   amiodarone  200 mg Oral Daily   apixaban  2.5 mg Oral BID   Chlorhexidine Gluconate Cloth  6 each Topical Q0600   Chlorhexidine Gluconate Cloth  6 each Topical Q0600   dextrose  1 ampule Intravenous Once   insulin aspart  0-15 Units Subcutaneous TID WC   insulin aspart  0-5 Units Subcutaneous QHS   midodrine  10 mg Oral Q M,W,F-HD   midodrine  5 mg Oral TID    sodium chloride 10  mL/hr at 10/11/22 0700   meropenem (MERREM) IV 500 mg (10/11/22 1606)   acetaminophen, docusate sodium, fentaNYL (SUBLIMAZE) injection, guaiFENesin, hydrALAZINE, ipratropium-albuterol, lidocaine, metoprolol tartrate, ondansetron (ZOFRAN) IV, oxyCODONE, polyethylene glycol, senna-docusate, traZODone

## 2022-10-12 NOTE — Discharge Instructions (Signed)

## 2022-10-12 NOTE — Progress Notes (Signed)
Pt receives out-pt HD at [facility] on MWF. Will assist as needed.   Tracy Mounce Renal Navigator [phone] 

## 2022-10-12 NOTE — Procedures (Signed)
Pre procedural Dx: ESRD Post procedural Dx: Same  Successful removal of tunneled HD catheter  EBL: None No immediate complications.  Please see imaging section of Epic for full dictation.  Villa Herb PA-C 10/12/2022 1:23 PM

## 2022-10-12 NOTE — Procedures (Signed)
I was present at this dialysis session. I have reviewed the session itself and made appropriate changes.   Filed Weights   10/11/22 0426 10/12/22 0603 10/12/22 0741  Weight: 75.7 kg 75.6 kg 76.3 kg    Recent Labs  Lab 10/12/22 0302  NA 131*  K 3.6  CL 93*  CO2 27  GLUCOSE 38*  BUN 14  CREATININE 1.78*  CALCIUM 7.9*  PHOS 3.6    Recent Labs  Lab 10/07/22 1332 10/08/22 0555 10/10/22 0407 10/11/22 0049 10/12/22 0302  WBC 21.7*   < > 13.2* 11.3* 10.8*  NEUTROABS 16.0*  --   --   --   --   HGB 8.1*   < > 9.5* 8.6* 8.7*  HCT 26.3*   < > 28.5* 26.9* 27.8*  MCV 92.6   < > 89.3 88.5 91.4  PLT 278   < > 295 288 278   < > = values in this interval not displayed.    Scheduled Meds:  sodium chloride   Intravenous Once   amiodarone  200 mg Oral Daily   apixaban  2.5 mg Oral BID   Chlorhexidine Gluconate Cloth  6 each Topical Q0600   Chlorhexidine Gluconate Cloth  6 each Topical Q0600   dextrose  1 ampule Intravenous Once   insulin aspart  0-15 Units Subcutaneous TID WC   insulin aspart  0-5 Units Subcutaneous QHS   midodrine  10 mg Oral Q M,W,F-HD   midodrine  5 mg Oral TID   Continuous Infusions:  sodium chloride 10 mL/hr at 10/11/22 0700   meropenem (MERREM) IV 500 mg (10/11/22 1606)   PRN Meds:.acetaminophen, docusate sodium, fentaNYL (SUBLIMAZE) injection, guaiFENesin, hydrALAZINE, ipratropium-albuterol, lidocaine, metoprolol tartrate, ondansetron (ZOFRAN) IV, oxyCODONE, polyethylene glycol, senna-docusate, traZODone   Louie Bun,  MD 10/12/2022, 10:17 AM

## 2022-10-12 NOTE — Progress Notes (Signed)
Progress Note   Patient: [name] [name] WUX:[id number] DOB: [birth year] DOA: 10/07/2022     5 DOS: the patient was seen and examined on 10/12/2022   Brief hospital course: 81/F with chronic systolic CHF, ESRD recently started on hemodialysis last month, P atrial fibrillation, diabetes, CVA.  Recently residing at SNF/rehab.  She experienced a syncopal episode versus cardiac arrest while she was initiating hemodialysis on 8/7.  She received 8 minutes of CPR, no epinephrine.  Was not intubated, CT head reassuring, CT chest noted nondisplaced multiple rib fractures.  Patient transmitted required norepinephrine.  Echo showed chronic systolic CHF with EF 25% and seen by cardiology.  No further workup.  Transferred out of the ICU on 8/9.  Patient has been tolerating medications well, blood pressure is low but more stable therefore reducing midodrine.  She is tolerating HD well. Due to bacteremia, planned for HD access removal after HD 10/12/22. PT/OT evaluation pending for disposition.  Assessment and Plan: Cardiac arrest Unclear if this was syncopal episode versus cardiac arrest.  ROSC was achieved in 8 minutes.  Echocardiogram shows EF of 25% with moderately elevated PA pressures and RV was not well-visualized.  Seen by cardiology, no further workup indicated at this time. CT of the head shows left temporal lobe encephalomalacia which is chronic otherwise no acute pathology   Recurrent ESBL Klebsiella Bacteremia cultures became positive for Klebsiella.  Patient does have hemodialysis tunneled catheter in place.  Continue meropenem started, previous cultures has grown ESBL.  Infectious disease evaluated her, advised to switch to ertapenem on dc.  HD catheter removal after HD today is planned.   Chronic hypotension In the setting of ESRD, systolic CHF and severe hypoalbuminemia.  Was transiently on IV pressors now off.   Reduce midodrine to 5 mg 3 times daily   Nondisplaced multiple rib  fractures Secondary to chest compressions.  Has chest wall tenderness.  Will continue to monitor.  Pain control as necessary.  Allow time to self heal.  Lidocaine patch as needed.   Volume overload ESRD Severe hypoalbuminemia Nephrology team is following.   Continue HD as per nephrology. Received albumin.   Chronic systolic heart failure Seen by cardiology team.  Echo shows EF of 25%.  No further workup at this time   A-fib with RVR RVR has resolved, continue amiodarone and Eliquis   Hyponatremia Overall sodium levels are stable around 130   Hypokalemia/hypomagnesemia Replete as needed   Chronic anemia Hemoglobin stable around 9.5.Marland Kitchens/p 1 unit PRBC transfusion on 8/9 for hb dro.   Insulin-dependent diabetes Hypoglycemia. Continue accucheks. Hypoglycemia protocol. Adjusted Sliding scale to very sensitive and Accu-Cheks.   Debility, frailty PT/OT-pending.   Given advanced age, multiple comorbidities, palliative care team on board, she is DNR.       Subjective: Patient is seen and examined today morning in HD unit. She is lying in bed, comfortably. Asks about her TV to be adjusted. Denies any complaints. Hypoglycemia noted this AM.  Physical Exam: Vitals:   10/12/22 1132 10/12/22 1145 10/12/22 1147 10/12/22 1237  BP: (!) 87/50 (!) 104/55 (!) 130/59 120/83  Pulse: 70 72 65 72  Resp: 13 16 15 20   Temp:  (!) 97.5 F (36.4 C) (!) 97.5 F (36.4 C) (!) 97.5 F (36.4 C)  TempSrc:    Oral  SpO2: 98% 97% 95% 97%  Weight:       General - Elderly Caucasian ill female, no apparent distress HEENT - PERRLA, EOMI, atraumatic head, non tender sinuses. Lung -  Clear, diffuse rales. Heart - S1, S2 heard, no murmurs, rubs, trace pedal edema Neuro - Alert, awake and oriented x 3, non focal exam. Skin - Warm and dry. Data Reviewed:     Latest Ref Rng & Units 10/12/2022    3:02 AM 10/11/2022   12:49 AM 10/10/2022    4:07 AM  CBC  WBC 4.0 - 10.5 K/uL 10.8  11.3  13.2    Hemoglobin 12.0 - 15.0 g/dL 8.7  8.6  9.5   Hematocrit 36.0 - 46.0 % 27.8  26.9  28.5   Platelets 150 - 400 K/uL 278  288  295        Latest Ref Rng & Units 10/12/2022    3:02 AM 10/11/2022   12:49 AM 10/10/2022    4:07 AM  BMP  Glucose 70 - 99 mg/dL 38  782  90   BUN 8 - 23 mg/dL 14  8  9    Creatinine 0.44 - 1.00 mg/dL 9.56  2.13  0.86   Sodium 135 - 145 mmol/L 131  131  132   Potassium 3.5 - 5.1 mmol/L 3.6  3.3  3.6   Chloride 98 - 111 mmol/L 93  95  97   CO2 22 - 32 mmol/L 27  28  25    Calcium 8.9 - 10.3 mg/dL 7.9  7.5  7.9      Family Communication: Patient's husband updated over phone, he understands above care plan.  Disposition: Status is: Inpatient Remains inpatient appropriate because: bacteremia, need HD catheter out.  Planned Discharge Destination: Home with Home Health    Time spent: 44 minutes  Author: Marcelino Duster, MD 10/12/2022 3:16 PM  For on call review [email or web address].

## 2022-10-13 DIAGNOSIS — I5021 Acute systolic (congestive) heart failure: Secondary | ICD-10-CM | POA: Diagnosis not present

## 2022-10-13 DIAGNOSIS — T827XXD Infection and inflammatory reaction due to other cardiac and vascular devices, implants and grafts, subsequent encounter: Secondary | ICD-10-CM | POA: Diagnosis not present

## 2022-10-13 DIAGNOSIS — E11649 Type 2 diabetes mellitus with hypoglycemia without coma: Secondary | ICD-10-CM | POA: Diagnosis not present

## 2022-10-13 DIAGNOSIS — I469 Cardiac arrest, cause unspecified: Secondary | ICD-10-CM | POA: Diagnosis not present

## 2022-10-13 DIAGNOSIS — Z1612 Extended spectrum beta lactamase (ESBL) resistance: Secondary | ICD-10-CM

## 2022-10-13 DIAGNOSIS — R7881 Bacteremia: Secondary | ICD-10-CM

## 2022-10-13 DIAGNOSIS — T827XXA Infection and inflammatory reaction due to other cardiac and vascular devices, implants and grafts, initial encounter: Secondary | ICD-10-CM

## 2022-10-13 DIAGNOSIS — L8990 Pressure ulcer of unspecified site, unspecified stage: Secondary | ICD-10-CM | POA: Insufficient documentation

## 2022-10-13 DIAGNOSIS — N186 End stage renal disease: Secondary | ICD-10-CM | POA: Diagnosis not present

## 2022-10-13 DIAGNOSIS — Z794 Long term (current) use of insulin: Secondary | ICD-10-CM

## 2022-10-13 DIAGNOSIS — A499 Bacterial infection, unspecified: Secondary | ICD-10-CM

## 2022-10-13 DIAGNOSIS — Z7189 Other specified counseling: Secondary | ICD-10-CM | POA: Diagnosis not present

## 2022-10-13 LAB — CULTURE, BLOOD (ROUTINE X 2)

## 2022-10-13 LAB — GLUCOSE, CAPILLARY
Glucose-Capillary: 163 mg/dL — ABNORMAL HIGH (ref 70–99)
Glucose-Capillary: 149 mg/dL — ABNORMAL HIGH (ref 70–99)
Glucose-Capillary: 202 mg/dL — ABNORMAL HIGH (ref 70–99)
Glucose-Capillary: 152 mg/dL — ABNORMAL HIGH (ref 70–99)
Glucose-Capillary: 167 mg/dL — ABNORMAL HIGH (ref 70–99)

## 2022-10-13 MED ORDER — METOPROLOL TARTRATE 5 MG/5ML IV SOLN: 5.0000 mg | INTRAVENOUS | Status: DC | PRN

## 2022-10-13 MED ORDER — DARBEPOETIN ALFA 60 MCG/0.3ML IJ SOSY: 60.0000 ug | PREFILLED_SYRINGE | INTRAMUSCULAR | Status: DC

## 2022-10-13 NOTE — Consult Note (Signed)
   [facility] CM Inpatient Consult   10/13/2022  [name] [name] May 30, 1940 [id number]  [facility] [[facility]]  Accountable Care Organization [ACO] Patient: Medicare ACO REACH  Primary Care Provider:  [name], MD with [facility]  Patient reviewed for less than 30 days readmission with 5 admissions in the past 6 months noted in the health system.  Patient is currently active with [facility] [[facility]] Care Management for chronic disease management services.  Patient has been engaged by a [employer] on behalf of [facility].  The community based plan of care has focused on disease management and community resource support. Admitted with  cardiac arrest during OP HD which includes an ICU level of care.  HD is MWF prior to admission.  Plan: Will follow with the inpatient [facility] team for post hospital needs for disposition  Will update the Community RN CC of any additional follow up needs for community care coordination.  Of note, [facility] Care Management services does not replace or interfere with any services that are needed or arranged by inpatient [facility] care management team.   For additional questions or referrals please contact:  Charlesetta Shanks, RN BSN CCM [facility]  [phone] business mobile phone Toll free office [phone]  Fax number: [phone] [email or web address]

## 2022-10-13 NOTE — Care Management Important Message (Signed)
Important Message  Patient Details  Name: [name] [name] MRN: [id number] Date of Birth: 11-11-1940   Medicare Important Message Given:  Yes     Renie Ora 10/13/2022, 8:35 AM

## 2022-10-13 NOTE — Progress Notes (Addendum)
Patient with a history of  Afib/AVR. She converted to NSR and now back to Afib with rate fluctuating from upper 90s to 110s and occasionally going into 120 s.  She's asymptomatic on reassessment. Notified Dr. Arlean Hopping and he ordered PRN  IV Metoprolol if her HR sustains greater than 120. Will continue to monitor.

## 2022-10-13 NOTE — Plan of Care (Signed)

## 2022-10-13 NOTE — Progress Notes (Signed)
[facility] night cross cover note:   I was notified by RN that this patient, with history of paroxysmal atrial fibrillation, who had previously converted to normal sinus rhythm, is now back in atrial fibrillation, with sustained heart rates in the 90s to low 100s.  With conversion back to atrial fibrillation, the patient remains asymptomatic at this time, with most recent documented blood pressure of 137/63.  Afebrile.  She is anticoagulated on Eliquis, and is also on oral amiodarone.  Does not appear to be on any scheduled AV nodal blocking agents, but does have an existing order for prn IV Lopressor.    Additionally, she has morning labs that include BMP as well as serum magnesium level. Will continue to monitor HR on tele along with associated VS, while following for result of her AM labs.     Newton Pigg, DO Hospitalist

## 2022-10-13 NOTE — Plan of Care (Signed)
  Problem: Clinical Measurements: Goal: Respiratory complications will improve Outcome: Progressing   Problem: Elimination: Goal: Will not experience complications related to bowel motility Outcome: Progressing Goal: Will not experience complications related to urinary retention Outcome: Progressing   Problem: Clinical Measurements: Goal: Ability to maintain clinical measurements within normal limits will improve Outcome: Not Progressing Goal: Cardiovascular complication will be avoided Outcome: Not Progressing   Problem: Activity: Goal: Risk for activity intolerance will decrease Outcome: Not Progressing   Problem: Pain Managment: Goal: General experience of comfort will improve Outcome: Not Progressing

## 2022-10-13 NOTE — Progress Notes (Signed)
Subjective: No new complaints   Antibiotics:  Anti-infectives (From admission, onward)    Start     Dose/Rate Route Frequency Ordered Stop   10/11/22 0000  meropenem (MERREM) 500 mg in sodium chloride 0.9 % 100 mL IVPB        500 mg 200 mL/hr over 30 Minutes Intravenous Daily-1800 10/10/22 2308     10/08/22 1600  ceFEPIme (MAXIPIME) 1 g in sodium chloride 0.9 % 100 mL IVPB  Status:  Discontinued        1 g 200 mL/hr over 30 Minutes Intravenous Every 24 hours 10/07/22 1620 10/08/22 1110   10/07/22 1620  vancomycin variable dose per unstable renal function (pharmacist dosing)  Status:  Discontinued         Does not apply See admin instructions 10/07/22 1620 10/08/22 1110   10/07/22 1415  ceFEPIme (MAXIPIME) 2 g in sodium chloride 0.9 % 100 mL IVPB        2 g 200 mL/hr over 30 Minutes Intravenous  Once 10/07/22 1412 10/07/22 1605   10/07/22 1415  vancomycin (VANCOREADY) IVPB 1750 mg/350 mL        1,750 mg 175 mL/hr over 120 Minutes Intravenous  Once 10/07/22 1412 10/07/22 1826       Medications: Scheduled Meds:  amiodarone  200 mg Oral Daily   apixaban  2.5 mg Oral BID   Chlorhexidine Gluconate Cloth  6 each Topical Q0600   [START ON 10/15/2022] darbepoetin (ARANESP) injection - DIALYSIS  60 mcg Subcutaneous Q Thu-1800   insulin aspart  0-5 Units Subcutaneous QHS   insulin aspart  0-6 Units Subcutaneous TID WC   midodrine  10 mg Oral Q M,W,F-HD   midodrine  5 mg Oral TID   Continuous Infusions:  sodium chloride 10 mL/hr at 10/11/22 0700   meropenem (MERREM) IV 500 mg (10/12/22 1701)   PRN Meds:.acetaminophen, docusate sodium, fentaNYL (SUBLIMAZE) injection, guaiFENesin, hydrALAZINE, ipratropium-albuterol, lidocaine, metoprolol tartrate, ondansetron (ZOFRAN) IV, oxyCODONE, polyethylene glycol, senna-docusate, traZODone    Objective: Weight change: 0.7 kg  Intake/Output Summary (Last 24 hours) at 10/13/2022 1438 Last data filed at 10/13/2022 0049 Gross per 24  hour  Intake --  Output 650 ml  Net -650 ml   Blood pressure (!) 144/64, pulse 80, temperature 98 F (36.7 C), temperature source Oral, resp. rate 19, weight 77.8 kg, SpO2 95%. Temp:  [97.4 F (36.3 C)-98.1 F (36.7 C)] 98 F (36.7 C) (08/13 1125) Pulse Rate:  [78-100] 80 (08/13 1125) Resp:  [13-20] 19 (08/13 1125) BP: (122-151)/(63-77) 144/64 (08/13 1125) SpO2:  [93 %-97 %] 95 % (08/13 1125) Weight:  [77.8 kg] 77.8 kg (08/13 0406)  Physical Exam: Physical Exam Constitutional:      General: She is not in acute distress.    Appearance: She is well-developed. She is not diaphoretic.  HENT:     Head: Normocephalic and atraumatic.     Right Ear: External ear normal.     Left Ear: External ear normal.     Mouth/Throat:     Pharynx: No oropharyngeal exudate.  Eyes:     General: No scleral icterus.    Conjunctiva/sclera: Conjunctivae normal.     Pupils: Pupils are equal, round, and reactive to light.  Cardiovascular:     Rate and Rhythm: Normal rate and regular rhythm.  Pulmonary:     Effort: Pulmonary effort is normal. No respiratory distress.     Breath sounds: No wheezing.  Abdominal:  General: Bowel sounds are normal. There is no distension.     Palpations: Abdomen is soft.  Musculoskeletal:        General: No tenderness. Normal range of motion.  Lymphadenopathy:     Cervical: No cervical adenopathy.  Skin:    General: Skin is warm and dry.     Coloration: Skin is not pale.     Findings: No erythema or rash.  Neurological:     General: No focal deficit present.     Mental Status: She is alert and oriented to person, place, and time.     Motor: No abnormal muscle tone.     Coordination: Coordination normal.  Psychiatric:        Mood and Affect: Mood normal.        Behavior: Behavior normal.        Thought Content: Thought content normal.        Judgment: Judgment normal.      CBC:    BMET Recent Labs    10/12/22 0302 10/13/22 1052  NA 131* 131*  K  3.6 4.3  CL 93* 96*  CO2 27 28  GLUCOSE 38* 148*  BUN 14 14  CREATININE 1.78* 1.85*  CALCIUM 7.9* 7.8*     Liver Panel  No results for input(s): "PROT", "ALBUMIN", "AST", "ALT", "ALKPHOS", "BILITOT", "BILIDIR", "IBILI" in the last 72 hours.     Sedimentation Rate No results for input(s): "ESRSEDRATE" in the last 72 hours. C-Reactive Protein No results for input(s): "CRP" in the last 72 hours.  Micro Results: Recent Results (from the past 720 hour(s))  Blood culture (routine x 2)     Status: Abnormal   Collection Time: 10/07/22  3:29 PM   Specimen: BLOOD RIGHT ARM  Result Value Ref Range Status   Specimen Description BLOOD RIGHT ARM  Final   Special Requests NONE  Final   Culture  Setup Time   Final    GRAM NEGATIVE RODS AEROBIC BOTTLE ONLY CRITICAL RESULT CALLED TO, READ BACK BY AND VERIFIED WITH: PHARMD G. ABBOTT 10/10/22 @ 2224 BY AB    Culture (A)  Final    KLEBSIELLA PNEUMONIAE Confirmed Extended Spectrum Beta-Lactamase Producer (ESBL).  In bloodstream infections from ESBL organisms, carbapenems are preferred over piperacillin/tazobactam. They are shown to have a lower risk of mortality. MULTI-DRUG RESISTANT ORGANISM CRITICAL RESULT CALLED TO, READ BACK BY AND VERIFIED WITH: RN Bernadene Person [phone] BY Berline Chough, [place] Performed at [facility], [address], [place], [place] [address]    Report Status 10/12/2022 FINAL  Final   Organism ID, Bacteria KLEBSIELLA PNEUMONIAE  Final      Susceptibility   Klebsiella pneumoniae - MIC*    AMPICILLIN >=32 RESISTANT Resistant     CEFEPIME >=32 RESISTANT Resistant     CEFTAZIDIME 32 RESISTANT Resistant     CEFTRIAXONE >=64 RESISTANT Resistant     CIPROFLOXACIN >=4 RESISTANT Resistant     GENTAMICIN >=16 RESISTANT Resistant     IMIPENEM 1 SENSITIVE Sensitive     TRIMETH/SULFA 160 RESISTANT Resistant     AMPICILLIN/SULBACTAM >=32 RESISTANT Resistant     PIP/TAZO 64 INTERMEDIATE Intermediate     * KLEBSIELLA  PNEUMONIAE  Blood Culture ID Panel (Reflexed)     Status: Abnormal   Collection Time: 10/07/22  3:29 PM  Result Value Ref Range Status   Enterococcus faecalis NOT DETECTED NOT DETECTED Final   Enterococcus Faecium NOT DETECTED NOT DETECTED Final   Listeria monocytogenes NOT  DETECTED NOT DETECTED Final   Staphylococcus species NOT DETECTED NOT DETECTED Final   Staphylococcus aureus (BCID) NOT DETECTED NOT DETECTED Final   Staphylococcus epidermidis NOT DETECTED NOT DETECTED Final   Staphylococcus lugdunensis NOT DETECTED NOT DETECTED Final   Streptococcus species NOT DETECTED NOT DETECTED Final   Streptococcus agalactiae NOT DETECTED NOT DETECTED Final   Streptococcus pneumoniae NOT DETECTED NOT DETECTED Final   Streptococcus pyogenes NOT DETECTED NOT DETECTED Final   A.calcoaceticus-baumannii NOT DETECTED NOT DETECTED Final   Bacteroides fragilis NOT DETECTED NOT DETECTED Final   Enterobacterales DETECTED (A) NOT DETECTED Final    Comment: Enterobacterales represent a large order of gram negative bacteria, not a single organism. CRITICAL RESULT CALLED TO, READ BACK BY AND VERIFIED WITH: PHARMD G. ABBOTT 10/10/22 @ 2224 BY AB    Enterobacter cloacae complex NOT DETECTED NOT DETECTED Final   Escherichia coli NOT DETECTED NOT DETECTED Final   Klebsiella aerogenes NOT DETECTED NOT DETECTED Final   Klebsiella oxytoca NOT DETECTED NOT DETECTED Final   Klebsiella pneumoniae DETECTED (A) NOT DETECTED Final    Comment: CRITICAL RESULT CALLED TO, READ BACK BY AND VERIFIED WITH: PHARMD G. ABBOTT 10/10/22 @ 2224 BY AB    Proteus species NOT DETECTED NOT DETECTED Final   Salmonella species NOT DETECTED NOT DETECTED Final   Serratia marcescens NOT DETECTED NOT DETECTED Final   Haemophilus influenzae NOT DETECTED NOT DETECTED Final   Neisseria meningitidis NOT DETECTED NOT DETECTED Final   Pseudomonas aeruginosa NOT DETECTED NOT DETECTED Final   Stenotrophomonas maltophilia NOT DETECTED NOT  DETECTED Final   Candida albicans NOT DETECTED NOT DETECTED Final   Candida auris NOT DETECTED NOT DETECTED Final   Candida glabrata NOT DETECTED NOT DETECTED Final   Candida krusei NOT DETECTED NOT DETECTED Final   Candida parapsilosis NOT DETECTED NOT DETECTED Final   Candida tropicalis NOT DETECTED NOT DETECTED Final   Cryptococcus neoformans/gattii NOT DETECTED NOT DETECTED Final   CTX-M ESBL DETECTED (A) NOT DETECTED Final    Comment: CRITICAL RESULT CALLED TO, READ BACK BY AND VERIFIED WITH: PHARMD G. ABBOTT 10/10/22 @ 2224 BY AB (NOTE) Extended spectrum beta-lactamase detected. Recommend a carbapenem as initial therapy.      Carbapenem resistance IMP NOT DETECTED NOT DETECTED Final   Carbapenem resistance KPC NOT DETECTED NOT DETECTED Final   Carbapenem resistance NDM NOT DETECTED NOT DETECTED Final   Carbapenem resist OXA 48 LIKE NOT DETECTED NOT DETECTED Final   Carbapenem resistance VIM NOT DETECTED NOT DETECTED Final    Comment: Performed at [facility], [address], [place], [place] [address]  MRSA Next Gen by PCR, Nasal     Status: None   Collection Time: 10/07/22  5:19 PM   Specimen: Nasal Mucosa; Nasal Swab  Result Value Ref Range Status   MRSA by PCR Next Gen NOT DETECTED NOT DETECTED Final    Comment: (NOTE) The GeneXpert MRSA Assay (FDA approved for NASAL specimens only), is one component of a comprehensive MRSA colonization surveillance program. It is not intended to diagnose MRSA infection nor to guide or monitor treatment for MRSA infections. Test performance is not FDA approved in patients less than 54 years old. Performed at [facility], [address], [place], [place] [address]   Culture, blood (Routine X 2) w Reflex to ID Panel     Status: None (Preliminary result)   Collection Time: 10/13/22 10:52 AM   Specimen: BLOOD  Result Value Ref Range Status  Specimen Description BLOOD LEFT ANTECUBITAL  Final   Special Requests   Final     AEROBIC BOTTLE ONLY Blood Culture results may not be optimal due to an inadequate volume of blood received in culture bottles Performed at [facility], [address] [place], [place] [address]    Culture PENDING  Incomplete   Report Status PENDING  Incomplete    Studies/Results: IR Removal Tun Cv Cath W/O FL  Result Date: 10/12/2022 INDICATION: Patient with history of ESRD on HD via right IJ HD catheter placed 09/18/22 in IR. Admitted with bacteremia. Request for removal of tunneled HD catheter for line holiday. EXAM: REMOVAL OF TUNNELED HEMODIALYSIS CATHETER MEDICATIONS: None COMPLICATIONS: None immediate. PROCEDURE: Informed written consent was obtained from the patient following an explanation of the procedure, risks, benefits and alternatives to treatment. A time out was performed prior to the initiation of the procedure. Maximal barrier sterile technique was utilized including caps, mask, sterile gowns, sterile gloves, large sterile drape, hand hygiene, and ChloraPrep. Utilizing gentle traction, the catheter was removed intact. Hemostasis was obtained with manual compression. A dressing was placed. The patient tolerated the procedure well without immediate post procedural complication. IMPRESSION: Successful removal of tunneled dialysis catheter. Performed by Lynnette Caffey, PA-C Electronically Signed   By: Acquanetta Belling M.D.   On: 10/12/2022 14:08      Assessment/Plan:  INTERVAL HISTORY: HD catheter has been removed   Principal Problem:   Cardiac arrest [facility]) Active Problems:   Severe obesity (BMI >= 40) (HCC)   Diabetes (HCC)   Acute systolic CHF (congestive heart failure) (HCC)   Atrial fibrillation with rapid ventricular response (HCC)   Anemia in chronic kidney disease (CKD)   ESRD on dialysis (HCC)   Hypokalemia   Cardiac arrest, cause unspecified (HCC)    [name] [name] is a 82 y.o. female with  ESBL bacteremia likely due to HD catheter that has now been  removed  #1 HD catheter associated bacteremia  Would want her ideally to 2 weeks of effective antibiotics post catheter removal.  Repeated her blood cultures now that her catheter has been removed  We are endeavoring to see if ertapenem 1 g IV given with hemodialysis 3 times a week may be an option for her.  We might also potentially have to go to daily or ertapenem with a separate dedicated line though would prefer not to do that  I have personally spent 50 minutes involved in face-to-face and non-face-to-face activities for this patient on the day of the visit. Professional time spent includes the following activities: Preparing to see the patient (review of tests), Obtaining and/or reviewing separately obtained history (admission/discharge record), Performing a medically appropriate examination and/or evaluation , Ordering medications/tests/procedures, referring and communicating with other health care professionals, Documenting clinical information in the EMR, Independently interpreting results (not separately reported), Communicating results to the patient/family/caregiver, Counseling and educating the patient/family/caregiver and Care coordination (not separately reported).      LOS: 6 days   [name] [name] 10/13/2022, 2:38 PM

## 2022-10-13 NOTE — Progress Notes (Signed)
Daily Progress Note   Patient Name: [name] [name]       Date: 10/13/2022 DOB: [birth year]  Age: 82 y.o. MRN#: [id number] Attending Physician: Marcelino Duster, MD Primary Care Physician: [name], MD Admit Date: 10/07/2022  Reason for Consultation/Follow-up: Establishing goals of care  Subjective: Feeling better today, ate well this morning, no complaints  Length of Stay: 6  Current Medications: Scheduled Meds:   amiodarone  200 mg Oral Daily   apixaban  2.5 mg Oral BID   Chlorhexidine Gluconate Cloth  6 each Topical Q0600   [START ON 10/15/2022] darbepoetin (ARANESP) injection - DIALYSIS  60 mcg Subcutaneous Q Thu-1800   insulin aspart  0-5 Units Subcutaneous QHS   insulin aspart  0-6 Units Subcutaneous TID WC   midodrine  10 mg Oral Q M,W,F-HD   midodrine  5 mg Oral TID    Continuous Infusions:  sodium chloride 10 mL/hr at 10/11/22 0700   meropenem (MERREM) IV 500 mg (10/12/22 1701)    PRN Meds: acetaminophen, docusate sodium, fentaNYL (SUBLIMAZE) injection, guaiFENesin, hydrALAZINE, ipratropium-albuterol, lidocaine, metoprolol tartrate, ondansetron (ZOFRAN) IV, oxyCODONE, polyethylene glycol, senna-docusate, traZODone  Physical Exam Constitutional:      General: She is not in acute distress.    Appearance: She is ill-appearing.  Pulmonary:     Effort: Pulmonary effort is normal.  Skin:    General: Skin is warm and dry.  Neurological:     Mental Status: She is alert and oriented to person, place, and time.     Comments: Easily confused             Vital Signs: BP (!) 144/64 (BP Location: Left Arm)   Pulse 80   Temp 98 F (36.7 C) (Oral)   Resp 19   Wt 77.8 kg   SpO2 95%   BMI 25.33 kg/m  SpO2: SpO2: 95 % O2 Device: O2 Device: Room Air O2 Flow Rate: O2 Flow  Rate (L/min): 2 L/min  Intake/output summary:  Intake/Output Summary (Last 24 hours) at 10/13/2022 1210 Last data filed at 10/13/2022 0049 Gross per 24 hour  Intake --  Output 650 ml  Net -650 ml   LBM: Last BM Date : 10/07/22 Baseline Weight: Weight: 79.6 kg Most recent weight: Weight: 77.8 kg       Palliative Assessment/Data: PPS 40%      Patient Active Problem List   Diagnosis Date Noted   Cardiac arrest (HCC) 10/07/2022   ESRD on dialysis (HCC) 10/07/2022   Hypokalemia 10/07/2022   Cardiac arrest, cause unspecified (HCC) 10/07/2022   Hyperkalemia 09/11/2022   Anemia in chronic kidney disease (CKD) 09/09/2022   Chronic systolic CHF (congestive heart failure) (HCC) 09/09/2022   ESBL Bacteremia due to UTI 09/07/2022   Acute CVA (cerebrovascular accident) (HCC) 09/06/2022   A-fib (HCC) 08/04/2022   Atrial fibrillation with rapid ventricular response (HCC) 08/03/2022   Acute renal failure superimposed on stage 4 chronic kidney disease (HCC) 07/20/2022   Supratherapeutic INR 07/08/2022   Hemorrhagic cystitis 07/08/2022   Gross hematuria 07/07/2022   CKD (chronic kidney disease) stage 4, GFR 15-29 ml/min (HCC) 04/10/2020   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   Osteoporosis 07/22/2018   Closed compression  fracture of L1 vertebra (HCC) 07/19/2018   Cardiomyopathy (HCC) 04/07/2018   Foot ulcer (HCC) 07/24/2016   Gram-negative infection    CKD (chronic kidney disease) stage 3, GFR 30-59 ml/min (HCC)    Postoperative anemia due to acute blood loss 01/04/2016   Left hip postoperative wound infection 01/02/2016   Paroxysmal atrial fibrillation (HCC) 05/20/2015   CAD S/P percutaneous coronary angioplasty 05/20/2015   Acute systolic CHF (congestive heart failure) (HCC) 05/20/2015   Acute kidney injury superimposed on chronic kidney disease (HCC) 05/11/2015   PCP NOTES >>>>>>>>>>>>>>>> 12/28/2014   Memory loss 05/17/2014   Diabetes (HCC) 01/23/2014   Anxiety and  depression, PCP notes  11/24/2013   Severe obesity (BMI >= 40) (HCC) 07/14/2013   Encounter for therapeutic drug monitoring 06/02/2013   History of encephalitis 11/18/2012   Intertrochanteric fracture of right hip (HCC) 07/13/2012   GERD (gastroesophageal reflux disease) 10/05/2011   Seizure disorder (HCC) 05/24/2011   Hyponatremia 05/17/2011   Annual physical exam >>>>>>>>>>>>>>>>>>>>>>>>>>>>>>>.. 02/10/2011   LUNG NODULE 09/01/2006   Hyperlipidemia 03/27/2006   Hereditary and idiopathic peripheral neuropathy 03/27/2006   RETINOPATHY, BACKGROUND NOS 03/27/2006   Essential hypertension 03/27/2006    Palliative Care Assessment & Plan   HPI: 82 y.o. female  with past medical history of chronic systolic CHF, ESRD recently started on hemodialysis last month, P atrial fibrillation, diabetes, and CVA admitted on 10/07/2022 with cardiac arrest during dialysis.  Etiology of cardiac arrest unclear.  Patient with ongoing volume overload however hypotension limits volume removal.  She also has cardiomyopathy that complicates situation as well as severe hypoalbuminemia.  PMT consulted to discuss goals of care.   Of note, PMT was involved during last admission July 2024.  At that time decision was made for DNR but continue all other measures including dialysis.  Assessment: Follow up today with patient - spouse not at bedside. Feeling much better today, no complaints.  HD catheter has been removed, she understands plan but is easily forgetful.  Continues to plan to dc to [place] [facility] and continue with HD. No questions or concerns.  Attempted to call spouse to provide update however we continuously became disconnected via phone.    Recommendations/Plan: Plans to continue w/current level of care - spouse hopeful for return to [facility], continue HD Requested outpatient palliative referral to follow at SNF Will ask PMT RN to shadow chart, no additional needs identified at this time - patient and  spouse understand situation  Code Status: DNR  Discharge Planning: Skilled Nursing Facility for rehab with Palliative care service follow-up  Care plan was discussed with patient and spouse  Thank you for allowing the Palliative Medicine Team to assist in the care of this patient.  *Please note that this is a verbal dictation therefore any spelling or grammatical errors are due to the "[employer] One" system interpretation.  Gerlean Ren, DNP, [facility] Palliative Medicine Team Team Phone # [phone]  Pager [phone]

## 2022-10-13 NOTE — Plan of Care (Signed)
  Problem: Health Behavior/Discharge Planning: Goal: Ability to manage health-related needs will improve Outcome: Progressing   

## 2022-10-13 NOTE — Progress Notes (Signed)
Progress Note   Patient: [name] [name] PIR:[id number] DOB: March 17, 1940 DOA: 10/07/2022     6 DOS: the patient was seen and examined on 10/13/2022   Brief hospital course: 81/F with chronic systolic CHF, ESRD recently started on hemodialysis last month, P atrial fibrillation, diabetes, CVA.  Recently residing at SNF/rehab.  She experienced a syncopal episode versus cardiac arrest while she was initiating hemodialysis on 8/7.  She received 8 minutes of CPR, no epinephrine.  Was not intubated, CT head reassuring, CT chest noted nondisplaced multiple rib fractures.  Patient transmitted required norepinephrine.  Echo showed chronic systolic CHF with EF 25% and seen by cardiology.  No further workup.  Transferred out of the ICU on 8/9.  Patient has been tolerating medications well, blood pressure is low but more stable therefore reducing midodrine.  She is tolerating HD well. Due to bacteremia, planned for HD access removal after HD 10/12/22. PT/OT evaluation pending for disposition.  Assessment and Plan: Cardiac arrest Unclear if this was syncopal episode versus cardiac arrest.  ROSC was achieved in 8 minutes.  Echocardiogram shows EF of 25% with moderately elevated PA pressures and RV was not well-visualized.  Seen by cardiology, no further workup indicated at this time. CT of the head shows left temporal lobe encephalomalacia which is chronic otherwise no acute pathology   Recurrent ESBL Klebsiella Bacteremia cultures became positive for Klebsiella.  Patient does have hemodialysis tunneled catheter in place.  Continue meropenem , previous cultures has grown ESBL.  Infectious disease evaluated her, advised 2 week therapy post catheter removal, switch to ertapenem 1gm with HD on dc.  HD catheter removed today after HD. Plan for replacement 8/15 after line holiday.   Chronic hypotension In the setting of ESRD, systolic CHF and severe hypoalbuminemia.  Was transiently on IV pressors now off.   Reduce  midodrine to 5 mg 3 times daily   Nondisplaced multiple rib fractures Secondary to chest compressions.  Has chest wall tenderness.  Will continue to monitor.  Pain control as necessary.  Allow time to self heal.  Lidocaine patch as needed.   Volume overload ESRD Severe hypoalbuminemia Nephrology team is following.   Continue HD as per nephrology. HD access replacement planned for 10/15/22 Received albumin.   Chronic systolic heart failure Seen by cardiology team.  Echo shows EF of 25%.  No further workup at this time   A-fib with RVR RVR on and off at night, continue amiodarone and Eliquis. Continue IV lopressor PRN.   Hyponatremia Stable around 130. Corrected with high sugars.   Hypokalemia/hypomagnesemia Replete as needed   Chronic anemia Hemoglobin stable around 9..s/p 1 unit PRBC transfusion on 8/9.   Insulin-dependent diabetes Hypoglycemia. Continue accucheks. Hypoglycemia protocol. Adjusted Sliding scale to very sensitive and Accu-Cheks.   Debility, frailty PT/OT follow up.   Given advanced age, multiple comorbidities, palliative care team on board, she is DNR.       Subjective: Patient is seen and examined today morning. She is lying in bed, comfortably. Denies any complaints. Leeks weak, able to converse more. Eating fair. Sugars better.  Physical Exam: Vitals:   10/13/22 0758 10/13/22 1125 10/13/22 1652 10/13/22 1928  BP: 139/68 (!) 144/64 (!) 149/65 138/64  Pulse: 78 80 81 73  Resp: 17 19 16    Temp: 98 F (36.7 C) 98 F (36.7 C) 98 F (36.7 C) (!) 97.5 F (36.4 C)  TempSrc: Oral Oral Oral Oral  SpO2: 97% 95% 96% 100%  Weight:  General - Elderly Caucasian ill female, no apparent distress HEENT - PERRLA, EOMI, atraumatic head, non tender sinuses. Lung - Clear, diffuse rales. Heart - S1, S2 heard, no murmurs, rubs, trace pedal edema Neuro - Alert, awake and oriented x 3, non focal exam. Skin - Warm and dry. Data Reviewed:     Latest  Ref Rng & Units 10/13/2022   10:52 AM 10/12/2022    3:02 AM 10/11/2022   12:49 AM  CBC  WBC 4.0 - 10.5 K/uL 9.3  10.8  11.3   Hemoglobin 12.0 - 15.0 g/dL 8.9  8.7  8.6   Hematocrit 36.0 - 46.0 % 27.8  27.8  26.9   Platelets 150 - 400 K/uL 318  278  288        Latest Ref Rng & Units 10/13/2022   10:52 AM 10/12/2022    3:02 AM 10/11/2022   12:49 AM  BMP  Glucose 70 - 99 mg/dL 132  38  440   BUN 8 - 23 mg/dL 14  14  8    Creatinine 0.44 - 1.00 mg/dL 1.02  7.25  3.66   Sodium 135 - 145 mmol/L 131  131  131   Potassium 3.5 - 5.1 mmol/L 4.3  3.6  3.3   Chloride 98 - 111 mmol/L 96  93  95   CO2 22 - 32 mmol/L 28  27  28    Calcium 8.9 - 10.3 mg/dL 7.8  7.9  7.5      Family Communication: Patient's husband updated over phone, he understands above care plan.  Disposition: Status is: Inpatient Remains inpatient appropriate because: IV antibiotics for bacteremia, need HD catheter replacement.  Planned Discharge Destination: Home with Home Health    Time spent: 44 minutes  Author: Marcelino Duster, MD 10/13/2022 8:34 PM  For on call review [email or web address].

## 2022-10-13 NOTE — Progress Notes (Signed)
[facility] Progress Note  Subjective: Feels well with no complaints. Got HD yesterday. HD cath removed post HD  Vitals:   10/13/22 0402 10/13/22 0406 10/13/22 0500 10/13/22 0758  BP:  122/77  139/68  Pulse:  100  78  Resp: 17 17 13 17   Temp:  98.1 F (36.7 C)  98 F (36.7 C)  TempSrc:  Oral  Oral  SpO2:  94%  97%  Weight:  77.8 kg      Exam: GEN: lying in bed, nad ENT: no nasal discharge, mmm EYES: no scleral icterus, eomi CV: normal rate, no rub PULM: no iwob, bilateral chest rise ABD: NABS, non-distended SKIN: no rashes or jaundice EXT: trace edema, warm and well perfused     Home meds include - alendronate, amiodarone, apixaban, atrovastatin, ezetimibe, insulin nph-regular, MVI, nystatin, pantoprazole, miralax, vit D, hydrocortisone cream 2.5%        OP HD: MWF SW  3.5h   400/600   78kg  2/2 bath  RIJ TDC  Heparin none - last OP HD 8/05, post wt 78.1, only 1.8kg removed - started OP HD around 7/26 - venofer 100mg  three times per week IV w hd thru 8/28     Assessment/ Plan: Cardiac arrest - occurred during OP HD session. 4 rounds of CPR. Long hx of systolic HF and recently started on dialysis. Seen by cardiology.  Volume overload - marked volume overload/ edema and pulm edema by CXR on admission. Now improved with HD. Volume removal as able. Adjust EDW at DC GNR bacteremia - blood cx's from 8/07 back + for Klebsiella pna. Hx of ESBL in the past as well. ID consulted. HD cath removed 8/12. Plan for replacement on 8/15 ESRD - on HD MWF. HD today then goal 72 hr line holiday. 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 Phos is low, holding any binders for now. Follow.  HFrEF - echo showed LVEF 25-30%. BP's too low for GDMT.  Chronic atrial fib - per cards IDDM    Darnell Level  10/13/2022, 10:20 AM  Recent Labs  Lab 10/09/22 0224 10/09/22 1757 10/10/22 0407 10/11/22 0049 10/12/22 0302  HGB 6.4*   < > 9.5* 8.6* 8.7*  ALBUMIN 2.0*  --  1.9*  --   --   CALCIUM 7.8*  --  7.9* 7.5* 7.9*  PHOS 2.3*  --  1.8*  --  3.6  CREATININE 1.96*  --  1.30* 1.17* 1.78*  K 3.5  --  3.6 3.3* 3.6   < > = values in this interval not displayed.   Recent Labs  Lab 10/09/22 0555  IRON 17*  TIBC 119*  FERRITIN 269   Inpatient medications:  amiodarone  200 mg Oral Daily   apixaban  2.5 mg Oral BID   Chlorhexidine Gluconate Cloth  6 each Topical Q0600   insulin aspart  0-5 Units Subcutaneous QHS   insulin aspart  0-6 Units Subcutaneous TID WC   midodrine  10 mg Oral Q M,W,F-HD   midodrine  5 mg Oral TID    sodium chloride 10 mL/hr at 10/11/22 0700   meropenem (MERREM) IV 500 mg (10/12/22 1701)   acetaminophen, docusate sodium, fentaNYL (SUBLIMAZE) injection, guaiFENesin, hydrALAZINE, ipratropium-albuterol, lidocaine, metoprolol tartrate, ondansetron (ZOFRAN) IV, oxyCODONE,  polyethylene glycol, senna-docusate, traZODone

## 2022-10-14 DIAGNOSIS — I469 Cardiac arrest, cause unspecified: Secondary | ICD-10-CM | POA: Diagnosis not present

## 2022-10-14 DIAGNOSIS — R7881 Bacteremia: Secondary | ICD-10-CM | POA: Diagnosis not present

## 2022-10-14 DIAGNOSIS — D631 Anemia in chronic kidney disease: Secondary | ICD-10-CM

## 2022-10-14 DIAGNOSIS — N186 End stage renal disease: Secondary | ICD-10-CM | POA: Diagnosis not present

## 2022-10-14 DIAGNOSIS — T827XXA Infection and inflammatory reaction due to other cardiac and vascular devices, implants and grafts, initial encounter: Secondary | ICD-10-CM

## 2022-10-14 DIAGNOSIS — E11649 Type 2 diabetes mellitus with hypoglycemia without coma: Secondary | ICD-10-CM | POA: Diagnosis not present

## 2022-10-14 LAB — GLUCOSE, CAPILLARY
Glucose-Capillary: 165 mg/dL — ABNORMAL HIGH (ref 70–99)
Glucose-Capillary: 175 mg/dL — ABNORMAL HIGH (ref 70–99)
Glucose-Capillary: 264 mg/dL — ABNORMAL HIGH (ref 70–99)
Glucose-Capillary: 147 mg/dL — ABNORMAL HIGH (ref 70–99)

## 2022-10-14 MED ORDER — PHENOL 1.4 % MT LIQD: 1.0000 | OROMUCOSAL | Status: DC | PRN

## 2022-10-14 NOTE — Progress Notes (Signed)
Pt stated she was having severe mouth pain x 2 days. Pt stated pain was so severe that she was unable to eat. Pt refused dinner and refused ensure shake due to mouth pain. No abnormalities noted upon inspection of her mouth. No redness, no swelling, no ulcers, no what patchy areas noted.

## 2022-10-14 NOTE — Progress Notes (Signed)
[name] [name] Progress Note  Subjective: Feels well with no complaints today excepts wants to go home.   Vitals:   10/13/22 1928 10/14/22 0005 10/14/22 0431 10/14/22 0755  BP: 138/64 138/62 (!) 151/66 (!) 147/69  Pulse: 73 73  80  Resp: 19 16  18   Temp: (!) 97.5 F (36.4 C)  (!) 97.5 F (36.4 C) 97.6 F (36.4 C)  TempSrc: Oral Oral Oral Oral  SpO2: 100% 96% 93% 93%  Weight:   78.3 kg     Exam: GEN: lying in bed, nad ENT: no nasal discharge, mmm EYES: no scleral icterus, eomi CV: normal rate, no rub PULM: no iwob, bilateral chest rise ABD: NABS, non-distended SKIN: no rashes or jaundice EXT: trace edema, warm and well perfused     Home meds include - alendronate, amiodarone, apixaban, atrovastatin, ezetimibe, insulin nph-regular, MVI, nystatin, pantoprazole, miralax, vit D, hydrocortisone cream 2.5%        OP HD: MWF SW  3.5h   400/600   78kg  2/2 bath  RIJ TDC  Heparin none - last OP HD 8/05, post wt 78.1, only 1.8kg removed - started OP HD around 7/26 - venofer 100mg  three times per week IV w hd thru 8/28     Assessment/ Plan: Cardiac arrest - occurred during OP HD session. 4 rounds of CPR. Long hx of systolic HF and recently started on dialysis. Seen by cardiology.  Volume overload - marked volume overload/ edema and pulm edema by CXR on admission. Now improved with HD. Volume removal as able. Adjust EDW at DC GNR bacteremia - blood cx's from 8/07 back + for Klebsiella pna. Hx of ESBL in the past as well. ID consulted. HD cath removed 8/12. Plan for replacement on 8/15.  Continue ertapenem.  Looking into whether this can be administered at dialysis. ESRD - on HD MWF. HD today then goal 72 hr line holiday. Next HD after [facility] placement on 8/15 Hypotension - chronic issue,  continues on midodrine Anemia esrd -  got 1u prbcs and Hb now in 8-10 range. 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 Phos is low, holding any binders for now. Follow.  HFrEF  - echo showed LVEF 25-30%. BP's too low for GDMT.  Chronic atrial fib - per cards IDDM    Darnell Level  10/14/2022, 10:40 AM  Recent Labs  Lab 10/09/22 0224 10/09/22 1757 10/10/22 0407 10/11/22 0049 10/12/22 0302 10/13/22 1052 10/14/22 0150  HGB 6.4*   < > 9.5*   < > 8.7* 8.9* 9.1*  ALBUMIN 2.0*  --  1.9*  --   --   --   --   CALCIUM 7.8*  --  7.9*   < > 7.9* 7.8* 8.2*  PHOS 2.3*  --  1.8*  --  3.6  --   --   CREATININE 1.96*  --  1.30*   < > 1.78* 1.85* 2.14*  K 3.5  --  3.6   < > 3.6 4.3 4.9   < > = values in this interval not displayed.   Recent Labs  Lab 10/09/22 0555  IRON 17*  TIBC 119*  FERRITIN 269   Inpatient medications:  amiodarone  200 mg Oral Daily   apixaban  2.5 mg Oral BID   Chlorhexidine Gluconate Cloth  6 each Topical Q0600   [START ON 10/15/2022] darbepoetin (ARANESP) injection - DIALYSIS  60 mcg Subcutaneous Q Thu-1800   insulin aspart  0-5 Units Subcutaneous  QHS   insulin aspart  0-6 Units Subcutaneous TID WC   midodrine  10 mg Oral Q M,W,F-HD   midodrine  5 mg Oral TID    sodium chloride 10 mL/hr at 10/11/22 0700   meropenem (MERREM) IV 500 mg (10/13/22 1705)   acetaminophen, docusate sodium, fentaNYL (SUBLIMAZE) injection, guaiFENesin, hydrALAZINE, ipratropium-albuterol, lidocaine, metoprolol tartrate, ondansetron (ZOFRAN) IV, oxyCODONE, polyethylene glycol, senna-docusate, traZODone

## 2022-10-14 NOTE — Progress Notes (Signed)
Pharmacy Antibiotic Note  [name] [name] is a 82 y.o. female admitted on 10/07/2022 with ESBL Klebsiella pneumoniae bacteremia.  Patient has a history of ESBL E.coli UTI and ESBL Klebsiella pneumoniae bacteremia in 08/2022 and completed a course of meropenem. Patient also has ESRD, recently started on HD MWF 08/2022. Last HD was on 8/10. Pharmacy has been consulted for Meropenem dosing.  TDC removed 8/12. Per ID, planning for abx for 2 weeks post removal = 8/25. May switch to ertapenem for easier administration at discharge.   Plan: Continue Meropenem 500mg  IV Q24hr, after HD on HD days   Weight: 78.3 kg (172 lb 9.9 oz)  Temp (24hrs), Avg:97.8 F (36.6 C), Min:97.5 F (36.4 C), Max:98 F (36.7 C)  Recent Labs  Lab 10/10/22 0407 10/11/22 0049 10/12/22 0302 10/13/22 1052 10/14/22 0150  WBC 13.2* 11.3* 10.8* 9.3 9.6  CREATININE 1.30* 1.17* 1.78* 1.85* 2.14*    Estimated Creatinine Clearance: 21.9 mL/min (A) (by C-G formula based on SCr of 2.14 mg/dL (H)).    Allergies  Allergen Reactions   Procaine Hcl Anaphylaxis   Amoxicillin Itching    Antimicrobials this admission: 8/7 Vancomycin x1 8/7 Cefepime x1  8/11 Meropenem >> 8/25    Microbiology results: 8/7 BCx 1/2-  ESBL Klebsiella pneumoniae 8/7 MRSA negative 8/13 Bcx NGTD   Thank you for allowing pharmacy to be a part of this patient's care.  Alphia Moh, PharmD, BCPS, BCCP Clinical Pharmacist  Please check AMION for all [facility] Pharmacy phone numbers After 10:00 PM, call Main Pharmacy [phone]

## 2022-10-14 NOTE — Progress Notes (Signed)
Progress Note   Patient: [name] [name] ZOX:[id number] DOB: Jul 11, 1940 DOA: 10/07/2022     7 DOS: the patient was seen and examined on 10/14/2022   Brief hospital course: 81/F with chronic systolic CHF, ESRD recently started on hemodialysis last month, P atrial fibrillation, diabetes, CVA.  Recently residing at SNF/rehab.  She experienced a syncopal episode versus cardiac arrest while she was initiating hemodialysis on 8/7.  She received 8 minutes of CPR, no epinephrine.  Was not intubated, CT head reassuring, CT chest noted nondisplaced multiple rib fractures.  Patient transmitted required norepinephrine.  Echo showed chronic systolic CHF with EF 25% and seen by cardiology.  No further workup.  Transferred out of the ICU on 8/9.  Patient has been tolerating medications well, blood pressure is low but more stable therefore reducing midodrine.  She is tolerating HD well. Due to bacteremia, planned for HD access removal after HD 10/12/22. PT/OT evaluation pending for disposition.  Assessment and Plan: Cardiac arrest Unclear if this was syncopal episode versus cardiac arrest.  ROSC was achieved in 8 minutes.  Echocardiogram shows EF of 25% with moderately elevated PA pressures and RV was not well-visualized.  Seen by cardiology, no further workup indicated at this time. CT of the head shows left temporal lobe encephalomalacia which is chronic otherwise no acute pathology   Recurrent ESBL Klebsiella Bacteremia cultures became positive for Klebsiella.  Patient does have hemodialysis tunneled catheter in place.  Continue meropenem , previous cultures has grown ESBL.  Infectious disease evaluated her, advised 2 week therapy post catheter removal, switch to ertapenem 1gm with HD on dc.  HD catheter removed today after HD. Plan for replacement 8/15 after line holiday.   Chronic hypotension In the setting of ESRD, systolic CHF and severe hypoalbuminemia.  Was transiently on IV pressors now off.   Reduce  midodrine to 5 mg 3 times daily   Nondisplaced multiple rib fractures Secondary to chest compressions.  Has chest wall tenderness.  Will continue to monitor.  Pain control as necessary.  Allow time to self heal.  Lidocaine patch as needed.   Volume overload ESRD Severe hypoalbuminemia Nephrology team is following.   Continue HD as per nephrology. HD access replacement planned for 10/15/22 Received albumin.   Chronic systolic heart failure Seen by cardiology team.  Echo shows EF of 25%.  No further workup at this time   A-fib with RVR RVR on and off at night, continue amiodarone and Eliquis. Continue IV lopressor PRN.   Hyponatremia Stable around 130. Corrected with high sugars.   Hypokalemia/hypomagnesemia Replete as needed   Chronic anemia Hemoglobin stable around 9..s/p 1 unit PRBC transfusion on 8/9.   Insulin-dependent diabetes Hypoglycemia. Continue accucheks. Hypoglycemia protocol. Adjusted Sliding scale to very sensitive and Accu-Cheks.   Debility, frailty PT/OT follow up.   Given advanced age, multiple comorbidities, palliative care team on board, she is DNR.       Subjective: Patient is seen and examined today morning. She is lying in bed, comfortably. Eating fair. No other complaints. Line removed yesterday.  Physical Exam: Vitals:   10/14/22 0431 10/14/22 0755 10/14/22 1134 10/14/22 1618  BP: (!) 151/66 (!) 147/69 (!) 144/65 (!) 133/57  Pulse:  80 77 75  Resp:  18 17 (!) 22  Temp: (!) 97.5 F (36.4 C) 97.6 F (36.4 C) 97.6 F (36.4 C) (!) 97.4 F (36.3 C)  TempSrc: Oral Oral Oral Oral  SpO2: 93% 93% 94% 96%  Weight: 78.3 kg  General - Elderly Caucasian ill female, no apparent distress HEENT - PERRLA, EOMI, atraumatic head, non tender sinuses. Lung - Clear, diffuse rales. Heart - S1, S2 heard, no murmurs, rubs, trace pedal edema Neuro - Alert, awake and oriented x 3, non focal exam. Skin - Warm and dry. Data Reviewed:     Latest Ref  Rng & Units 10/14/2022    1:50 AM 10/13/2022   10:52 AM 10/12/2022    3:02 AM  CBC  WBC 4.0 - 10.5 K/uL 9.6  9.3  10.8   Hemoglobin 12.0 - 15.0 g/dL 9.1  8.9  8.7   Hematocrit 36.0 - 46.0 % 28.8  27.8  27.8   Platelets 150 - 400 K/uL 332  318  278        Latest Ref Rng & Units 10/14/2022    1:50 AM 10/13/2022   10:52 AM 10/12/2022    3:02 AM  BMP  Glucose 70 - 99 mg/dL 782  956  38   BUN 8 - 23 mg/dL 18  14  14    Creatinine 0.44 - 1.00 mg/dL 2.13  0.86  5.78   Sodium 135 - 145 mmol/L 131  131  131   Potassium 3.5 - 5.1 mmol/L 4.9  4.3  3.6   Chloride 98 - 111 mmol/L 93  96  93   CO2 22 - 32 mmol/L 28  28  27    Calcium 8.9 - 10.3 mg/dL 8.2  7.8  7.9      Family Communication: Patient's husband updated over phone yesterday, he understands above care plan.  Disposition: Status is: Inpatient Remains inpatient appropriate because: IV antibiotics for bacteremia, need HD catheter replacement.  Planned Discharge Destination: Home with Home Health    Time spent: 44 minutes  Author: Marcelino Duster, MD 10/14/2022 8:37 PM  For on call review [email or web address].

## 2022-10-14 NOTE — Plan of Care (Signed)
  Problem: Education: Goal: Knowledge of General Education information will improve Description Including pain rating scale, medication(s)/side effects and non-pharmacologic comfort measures Outcome: Progressing   

## 2022-10-14 NOTE — Progress Notes (Signed)
Contacted [facility] to inquire if clinic is able to order iv ertapenem for pt to receive with HD at d/c. Spoke to charge RN at clinic who reports that antibiotic cannot be ordered and given with HD. Update provided to nephrologist and pharmacist. Will assist as needed.   Olivia Canter Renal Navigator [phone]

## 2022-10-15 DIAGNOSIS — N186 End stage renal disease: Secondary | ICD-10-CM | POA: Diagnosis not present

## 2022-10-15 DIAGNOSIS — I469 Cardiac arrest, cause unspecified: Secondary | ICD-10-CM | POA: Diagnosis not present

## 2022-10-15 DIAGNOSIS — R7881 Bacteremia: Secondary | ICD-10-CM | POA: Diagnosis not present

## 2022-10-15 DIAGNOSIS — E11649 Type 2 diabetes mellitus with hypoglycemia without coma: Secondary | ICD-10-CM | POA: Diagnosis not present

## 2022-10-15 LAB — BASIC METABOLIC PANEL
Anion gap: 10 (ref 5–15)
BUN: 25 mg/dL — ABNORMAL HIGH (ref 8–23)
CO2: 26 mmol/L (ref 22–32)
Calcium: 8.1 mg/dL — ABNORMAL LOW (ref 8.9–10.3)
Chloride: 95 mmol/L — ABNORMAL LOW (ref 98–111)
Creatinine, Ser: 2.49 mg/dL — ABNORMAL HIGH (ref 0.44–1.00)
GFR, Estimated: 19 mL/min — ABNORMAL LOW (ref >60)
Glucose, Bld: 124 mg/dL — ABNORMAL HIGH (ref 70–99)
Potassium: 4.4 mmol/L (ref 3.5–5.1)
Sodium: 131 mmol/L — ABNORMAL LOW (ref 135–145)

## 2022-10-15 LAB — GLUCOSE, CAPILLARY
Glucose-Capillary: 133 mg/dL — ABNORMAL HIGH (ref 70–99)
Glucose-Capillary: 169 mg/dL — ABNORMAL HIGH (ref 70–99)
Glucose-Capillary: 272 mg/dL — ABNORMAL HIGH (ref 70–99)
Glucose-Capillary: 324 mg/dL — ABNORMAL HIGH (ref 70–99)

## 2022-10-15 LAB — CBC
HCT: 28.7 % — ABNORMAL LOW (ref 36.0–46.0)
Hemoglobin: 9.1 g/dL — ABNORMAL LOW (ref 12.0–15.0)
MCH: 29.1 pg (ref 26.0–34.0)
MCHC: 31.7 g/dL (ref 30.0–36.0)
MCV: 91.7 fL (ref 80.0–100.0)
Platelets: 323 10*3/uL (ref 150–400)
RBC: 3.13 MIL/uL — ABNORMAL LOW (ref 3.87–5.11)
RDW: 19.5 % — ABNORMAL HIGH (ref 11.5–15.5)
WBC: 11.2 10*3/uL — ABNORMAL HIGH (ref 4.0–10.5)
nRBC: 0 % (ref 0.0–0.2)

## 2022-10-15 LAB — MAGNESIUM: Magnesium: 1.6 mg/dL — ABNORMAL LOW (ref 1.7–2.4)

## 2022-10-15 MED ORDER — MAGIC MOUTHWASH
5.0000 mL | Freq: Three times a day (TID) | ORAL | Status: DC
  Administered 2022-10-15 – 2022-10-16 (×4): 5 mL via ORAL
  Filled 2022-10-15 (×9): qty 5

## 2022-10-15 MED ORDER — DARBEPOETIN ALFA 60 MCG/0.3ML IJ SOSY
60.0000 ug | PREFILLED_SYRINGE | INTRAMUSCULAR | Status: DC
  Filled 2022-10-15: qty 0.3

## 2022-10-15 MED ORDER — PHENOL 1.4 % MT LIQD: 1.0000 | OROMUCOSAL | Status: DC | PRN

## 2022-10-15 MED ORDER — MAGNESIUM SULFATE 2 GM/50ML IV SOLN
2.0000 g | Freq: Once | INTRAVENOUS | Status: AC
  Administered 2022-10-15: 2 g via INTRAVENOUS
  Filled 2022-10-15: qty 50

## 2022-10-15 NOTE — Progress Notes (Signed)
[facility] Progress Note  Subjective: Some mouth pain for the last couple days.  Otherwise denies any complaints.  Vitals:   10/14/22 2036 10/15/22 0034 10/15/22 0601 10/15/22 0803  BP: (!) 150/81 (!) 124/58 (!) 145/69 (!) 142/67  Pulse: 79 76 75 75  Resp: (!) 25 18 18 17   Temp: (!) 97.4 F (36.3 C) (!) 97.5 F (36.4 C) (!) 97.3 F (36.3 C) (!) 97.3 F (36.3 C)  TempSrc: Oral Oral Axillary   SpO2: 95% 93% 93% 95%  Weight:        Exam: GEN: lying in bed, nad ENT: no nasal discharge, mmm EYES: no scleral icterus, eomi CV: normal rate, no rub PULM: no iwob, bilateral chest rise ABD: NABS, non-distended SKIN: no rashes or jaundice EXT: trace edema, warm and well perfused     Home meds include - alendronate, amiodarone, apixaban, atrovastatin, ezetimibe, insulin nph-regular, MVI, nystatin, pantoprazole, miralax, vit D, hydrocortisone cream 2.5%        OP HD: MWF SW  3.5h   400/600   78kg  2/2 bath  RIJ TDC  Heparin none - last OP HD 8/05, post wt 78.1, only 1.8kg removed - started OP HD around 7/26 - venofer 100mg  three times per week IV w hd thru 8/28     Assessment/ Plan: Cardiac arrest - occurred during OP HD session. 4 rounds of CPR. Long hx of systolic HF and recently started on dialysis. Seen by cardiology.  Volume overload - marked volume overload/ edema and pulm edema by CXR on admission. Now improved with HD. Volume removal as able. Adjust EDW at DC GNR bacteremia - blood cx's from 8/07 back + for Klebsiella pna. Hx of ESBL in the past as well. ID consulted. HD cath removed 8/12. Plan for replacement on 8/15.  Continue ertapenem.  Looking into whether this can be administered at dialysis; appreciate help from social work. ESRD - on HD MWF. HD today then goal 72 hr line holiday.  Labs look great and no extensive signs of volume overload.  Tentative plan for tunneled dialysis catheter tomorrow with IR.  Dialysis after that Hypotension - chronic issue,   continues on midodrine Anemia esrd -  got 1u prbcs and Hb now in 8-10 range. 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 Phos is low, holding any binders for now. Follow.  HFrEF - echo showed LVEF 25-30%. BP's too low for GDMT.  Chronic atrial fib - per cards IDDM Mouth pain: Tongue appears red.  Possibly thrush in the setting of antibiotics.  Will order Magic mouthwash    Darnell Level  10/15/2022, 9:42 AM  Recent Labs  Lab 10/09/22 0224 10/09/22 1757 10/10/22 0407 10/11/22 0049 10/12/22 0302 10/13/22 1052 10/14/22 0150 10/15/22 0400  HGB 6.4*   < > 9.5*   < > 8.7*   < > 9.1* 9.1*  ALBUMIN 2.0*  --  1.9*  --   --   --   --   --   CALCIUM 7.8*  --  7.9*   < > 7.9*   < > 8.2* 8.1*  PHOS 2.3*  --  1.8*  --  3.6  --   --   --   CREATININE 1.96*  --  1.30*   < > 1.78*   < > 2.14* 2.49*  K 3.5  --  3.6   < > 3.6   < > 4.9 4.4   < > = values in this  interval not displayed.   Recent Labs  Lab 10/09/22 0555  IRON 17*  TIBC 119*  FERRITIN 269   Inpatient medications:  amiodarone  200 mg Oral Daily   apixaban  2.5 mg Oral BID   Chlorhexidine Gluconate Cloth  6 each Topical Q0600   darbepoetin (ARANESP) injection - DIALYSIS  60 mcg Subcutaneous Q Thu-1800   insulin aspart  0-5 Units Subcutaneous QHS   insulin aspart  0-6 Units Subcutaneous TID WC   magic mouthwash  5 mL Oral TID   midodrine  10 mg Oral Q M,W,F-HD   midodrine  5 mg Oral TID    sodium chloride 10 mL/hr at 10/11/22 0700   meropenem (MERREM) IV 500 mg (10/14/22 1734)   acetaminophen, docusate sodium, fentaNYL (SUBLIMAZE) injection, guaiFENesin, hydrALAZINE, ipratropium-albuterol, lidocaine, metoprolol tartrate, ondansetron (ZOFRAN) IV, oxyCODONE, phenol, polyethylene glycol, senna-docusate, traZODone

## 2022-10-15 NOTE — Progress Notes (Addendum)
[facility]  Date of Admission:  10/07/2022      Total days of antibiotics 8             ASSESSMENT: [name] [name] is a 82 y.o. female admitted with ESBL producing klebsiella pneumoniae bacteremia 2/2 infected HD line.   Bacteremia -  -Line removed 8/12 with plans to replace tomorrow 8/16 -unfortunately efforts to secure ertapenem outpatient in HD center did not work and she will require a tunneled central line for daily ertapenem injections.  -will arrange FU at [facility] so we can arrange line removal.  -She feels confident that her family can help support her medical needs while her husband is in the hospital.   OPAT ORDERS:  Diagnosis: HD line infection   Culture Result: ESBL Kleb Pneumo  Allergies  Allergen Reactions   Procaine Hcl Anaphylaxis   Amoxicillin Itching    Discharge antibiotics to be given via PICC line:  Ertapenem 1 gn IV QD   Duration: 2 weeks    End Date: 8/25  [facility] Care Per Protocol with [employer] Use: Home health RN for IV administration and teaching, line care and labs.    Labs weekly while on IV antibiotics: _x_ CBC with differential __ BMP **TWICE WEEKLY ON VANCOMYCIN  _x_ CMP __ CRP __ ESR __ Vancomycin trough TWICE WEEKLY __ CK  __ Please pull PIC at completion of IV antibiotics _x_ Please leave PIC in place until doctor has seen patient or been notified --> needs tunneled line removal with IR after completion   Fax weekly labs to [phone]  Clinic Follow Up Appt: 8/26 @ 1:30 pm with Dr. Daiva Eves    PLAN: IV ABX as above IR order placed and message sent to [facility] re: needing central line/PICC placement as well as HD line.   D/W primary - ID will sign off - please call back with any questions/concerns or if we can be of further assistance.     Principal Problem:   Cardiac arrest [facility]) Active Problems:   Severe obesity (BMI >= 40) (HCC)   Diabetes (HCC)   Acute systolic CHF  (congestive heart failure) (HCC)   Atrial fibrillation with rapid ventricular response (HCC)   Anemia in chronic kidney disease (CKD)   ESRD on dialysis (HCC)   Hypokalemia   Cardiac arrest, cause unspecified (HCC)   Hemodialysis catheter infection (HCC)   ESBL (extended spectrum beta-lactamase) producing bacteria infection   Pressure injury of skin    amiodarone  200 mg Oral Daily   apixaban  2.5 mg Oral BID   Chlorhexidine Gluconate Cloth  6 each Topical Q0600   [START ON 10/16/2022] darbepoetin (ARANESP) injection - DIALYSIS  60 mcg Subcutaneous Q Fri-1800   insulin aspart  0-5 Units Subcutaneous QHS   insulin aspart  0-6 Units Subcutaneous TID WC   magic mouthwash  5 mL Oral TID   midodrine  10 mg Oral Q M,W,F-HD   midodrine  5 mg Oral TID    SUBJECTIVE: Doing OK - stressed with her husband being hospitalized.    Review of Systems: Review of Systems  Constitutional:  Negative for chills and fever.    Allergies  Allergen Reactions   Procaine Hcl Anaphylaxis   Amoxicillin Itching    OBJECTIVE: Vitals:   10/15/22 0034 10/15/22 0601 10/15/22 0803 10/15/22 1124  BP: (!) 124/58 (!) 145/69 (!) 142/67 122/72  Pulse: 76 75 75 86  Resp:  18 18 17 13   Temp: (!) 97.5 F (36.4 C) (!) 97.3 F (36.3 C) (!) 97.3 F (36.3 C) 98.5 F (36.9 C)  TempSrc: Oral Axillary  Oral  SpO2: 93% 93% 95% 94%  Weight:       Body mass index is 25.49 kg/m.  Physical Exam Constitutional:      Appearance: Normal appearance. She is not ill-appearing.  HENT:     Mouth/Throat:     Mouth: Mucous membranes are moist.     Pharynx: Oropharynx is clear.  Eyes:     General: No scleral icterus. Cardiovascular:     Rate and Rhythm: Normal rate and regular rhythm.  Pulmonary:     Effort: Pulmonary effort is normal.  Neurological:     Mental Status: She is alert and oriented to person, place, and time.  Psychiatric:        Mood and Affect: Mood normal.        Thought Content: Thought content  normal.     Lab Results Lab Results  Component Value Date   WBC 11.2 (H) 10/15/2022   HGB 9.1 (L) 10/15/2022   HCT 28.7 (L) 10/15/2022   MCV 91.7 10/15/2022   PLT 323 10/15/2022    Lab Results  Component Value Date   CREATININE 2.49 (H) 10/15/2022   BUN 25 (H) 10/15/2022   NA 131 (L) 10/15/2022   K 4.4 10/15/2022   CL 95 (L) 10/15/2022   CO2 26 10/15/2022    Lab Results  Component Value Date   ALT 9 10/10/2022   AST 19 10/10/2022   ALKPHOS 91 10/10/2022   BILITOT 1.1 10/10/2022     Microbiology: Recent Results (from the past 240 hour(s))  Blood culture (routine x 2)     Status: Abnormal   Collection Time: 10/07/22  3:29 PM   Specimen: BLOOD RIGHT ARM  Result Value Ref Range Status   Specimen Description BLOOD RIGHT ARM  Final   Special Requests NONE  Final   Culture  Setup Time   Final    GRAM NEGATIVE RODS AEROBIC BOTTLE ONLY CRITICAL RESULT CALLED TO, READ BACK BY AND VERIFIED WITH: PHARMD G. ABBOTT 10/10/22 @ 2224 BY AB    Culture (A)  Final    KLEBSIELLA PNEUMONIAE Confirmed Extended Spectrum Beta-Lactamase Producer (ESBL).  In bloodstream infections from ESBL organisms, carbapenems are preferred over piperacillin/tazobactam. They are shown to have a lower risk of mortality. MULTI-DRUG RESISTANT ORGANISM CRITICAL RESULT CALLED TO, READ BACK BY AND VERIFIED WITH: RN Bernadene Person [phone] BY Berline Chough, [place] Performed at [facility], [address], [place], [place] [address]    Report Status 10/12/2022 FINAL  Final   Organism ID, Bacteria KLEBSIELLA PNEUMONIAE  Final      Susceptibility   Klebsiella pneumoniae - MIC*    AMPICILLIN >=32 RESISTANT Resistant     CEFEPIME >=32 RESISTANT Resistant     CEFTAZIDIME 32 RESISTANT Resistant     CEFTRIAXONE >=64 RESISTANT Resistant     CIPROFLOXACIN >=4 RESISTANT Resistant     GENTAMICIN >=16 RESISTANT Resistant     IMIPENEM 1 SENSITIVE Sensitive     TRIMETH/SULFA 160 RESISTANT Resistant      AMPICILLIN/SULBACTAM >=32 RESISTANT Resistant     PIP/TAZO 64 INTERMEDIATE Intermediate     * KLEBSIELLA PNEUMONIAE  Blood Culture ID Panel (Reflexed)     Status: Abnormal   Collection Time: 10/07/22  3:29 PM  Result Value Ref Range Status   Enterococcus faecalis  NOT DETECTED NOT DETECTED Final   Enterococcus Faecium NOT DETECTED NOT DETECTED Final   Listeria monocytogenes NOT DETECTED NOT DETECTED Final   Staphylococcus species NOT DETECTED NOT DETECTED Final   Staphylococcus aureus (BCID) NOT DETECTED NOT DETECTED Final   Staphylococcus epidermidis NOT DETECTED NOT DETECTED Final   Staphylococcus lugdunensis NOT DETECTED NOT DETECTED Final   Streptococcus species NOT DETECTED NOT DETECTED Final   Streptococcus agalactiae NOT DETECTED NOT DETECTED Final   Streptococcus pneumoniae NOT DETECTED NOT DETECTED Final   Streptococcus pyogenes NOT DETECTED NOT DETECTED Final   A.calcoaceticus-baumannii NOT DETECTED NOT DETECTED Final   Bacteroides fragilis NOT DETECTED NOT DETECTED Final   Enterobacterales DETECTED (A) NOT DETECTED Final    Comment: Enterobacterales represent a large order of gram negative bacteria, not a single organism. CRITICAL RESULT CALLED TO, READ BACK BY AND VERIFIED WITH: PHARMD G. ABBOTT 10/10/22 @ 2224 BY AB    Enterobacter cloacae complex NOT DETECTED NOT DETECTED Final   Escherichia coli NOT DETECTED NOT DETECTED Final   Klebsiella aerogenes NOT DETECTED NOT DETECTED Final   Klebsiella oxytoca NOT DETECTED NOT DETECTED Final   Klebsiella pneumoniae DETECTED (A) NOT DETECTED Final    Comment: CRITICAL RESULT CALLED TO, READ BACK BY AND VERIFIED WITH: PHARMD G. ABBOTT 10/10/22 @ 2224 BY AB    Proteus species NOT DETECTED NOT DETECTED Final   Salmonella species NOT DETECTED NOT DETECTED Final   Serratia marcescens NOT DETECTED NOT DETECTED Final   Haemophilus influenzae NOT DETECTED NOT DETECTED Final   Neisseria meningitidis NOT DETECTED NOT DETECTED Final    Pseudomonas aeruginosa NOT DETECTED NOT DETECTED Final   Stenotrophomonas maltophilia NOT DETECTED NOT DETECTED Final   Candida albicans NOT DETECTED NOT DETECTED Final   Candida auris NOT DETECTED NOT DETECTED Final   Candida glabrata NOT DETECTED NOT DETECTED Final   Candida krusei NOT DETECTED NOT DETECTED Final   Candida parapsilosis NOT DETECTED NOT DETECTED Final   Candida tropicalis NOT DETECTED NOT DETECTED Final   Cryptococcus neoformans/gattii NOT DETECTED NOT DETECTED Final   CTX-M ESBL DETECTED (A) NOT DETECTED Final    Comment: CRITICAL RESULT CALLED TO, READ BACK BY AND VERIFIED WITH: PHARMD G. ABBOTT 10/10/22 @ 2224 BY AB (NOTE) Extended spectrum beta-lactamase detected. Recommend a carbapenem as initial therapy.      Carbapenem resistance IMP NOT DETECTED NOT DETECTED Final   Carbapenem resistance KPC NOT DETECTED NOT DETECTED Final   Carbapenem resistance NDM NOT DETECTED NOT DETECTED Final   Carbapenem resist OXA 48 LIKE NOT DETECTED NOT DETECTED Final   Carbapenem resistance VIM NOT DETECTED NOT DETECTED Final    Comment: Performed at [facility], [address] [place], [place] [address]  MRSA Next Gen by PCR, Nasal     Status: None   Collection Time: 10/07/22  5:19 PM   Specimen: Nasal Mucosa; Nasal Swab  Result Value Ref Range Status   MRSA by PCR Next Gen NOT DETECTED NOT DETECTED Final    Comment: (NOTE) The GeneXpert MRSA Assay (FDA approved for NASAL specimens only), is one component of a comprehensive MRSA colonization surveillance program. It is not intended to diagnose MRSA infection nor to guide or monitor treatment for MRSA infections. Test performance is not FDA approved in patients less than 59 years old. Performed at [facility], [address], [place], [place] [address]   Culture, blood (Routine X 2) w Reflex to ID Panel     Status: None (Preliminary  result)   Collection Time: 10/13/22 10:52 AM   Specimen: BLOOD  Result Value  Ref Range Status   Specimen Description BLOOD LEFT ANTECUBITAL  Final   Special Requests   Final    AEROBIC BOTTLE ONLY Blood Culture results may not be optimal due to an inadequate volume of blood received in culture bottles   Culture   Final    NO GROWTH 2 DAYS Performed at [facility], [address] [place], [place] [address]    Report Status PENDING  Incomplete  Culture, blood (Routine X 2) w Reflex to ID Panel     Status: None (Preliminary result)   Collection Time: 10/13/22 10:53 AM   Specimen: BLOOD  Result Value Ref Range Status   Specimen Description BLOOD BLOOD LEFT HAND  Final   Special Requests   Final    BOTTLES DRAWN AEROBIC AND ANAEROBIC Blood Culture adequate volume   Culture   Final    NO GROWTH 2 DAYS Performed at [facility], [address] [place], [place] [address]    Report Status PENDING  Incomplete    Rexene Alberts, MSN, NP-C [facility]  [email or web address] Pager: [phone] Office: [phone] [facility] Main Line: [phone] *Secure Chat Communication Welcome   Total time spent 30 minutes including time spent reviewing pertinent records/lab results/diagnostics and time spent coordinating care amongst other providers / specialists.

## 2022-10-15 NOTE — Plan of Care (Signed)
progressing 

## 2022-10-15 NOTE — Consult Note (Signed)
Chief Complaint: Patient was seen in consultation today for tunneled dialysis catheter placement at the request of Dr Gwenith Spitz   Supervising Physician: Mir, Mauri Reading  Patient Status: [facility] - In-pt  History of Present Illness: [name] [name] is a 82 y.o. female   DNR Code status--- status is to remain in place during procedure in IR per pt ESRD Started dialysis just last month Non tunneled dialysis catheter placed in IR 7/15 Tunneled placed 7/19 Cardiac arrest at [facility] HD center- admitted 10/07/22 Developed Bacteremia- Klebsiella Tunneled cath removed 8/12 [place] Hoped for [name] tunn cath today - but wbc still 11.2 Afeb IR offered non tunn cath or wait one more day to check wbc Could possibly place tunneled cath in IR 8/16 Discussed with Dr Danny Lawless plan for 8/16  Scheduled for tunneled dialysis catheter placement tomorrow - dependent on WBC  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   HYPERLIPIDEMIA 03/27/2006   HYPERTENSION 03/27/2006   Intertrochanteric fracture of right hip (HCC) 07/13/2012   LUNG NODULE 09/01/2006   Excision, Bx Benighn   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"   Osteopenia 2004   Dexa 2004 showed Osteopenia, DEXA 03/2007 normal   Osteopenia    Other chronic cystitis with hematuria    Pelvic fracture (HCC) 06/16/2016   S/P fall   Recurrent urinary tract infection    Seeing Urology   RETINOPATHY, BACKGROUND NOS 03/27/2006   Seizures (HCC)    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    Past  Surgical History:  Procedure Laterality Date   ANKLE FRACTURE SURGERY Bilateral 08/2015   "right one was in 2 places"   CARDIAC CATHETERIZATION N/A 05/17/2015   Procedure: Left Heart Cath and Coronary Angiography;  Surgeon: Corky Crafts, MD;  Location: [facility] INVASIVE CV LAB;  Service: Cardiovascular;  Laterality: N/A;   CARDIAC CATHETERIZATION N/A 05/17/2015   Procedure: Coronary Balloon Angioplasty;  Surgeon: Corky Crafts, MD;  Location: [facility] INVASIVE CV LAB;  Service: Cardiovascular;  Laterality: N/A;   CARDIOVERSION N/A 08/05/2022   Procedure: CARDIOVERSION;  Surgeon: Chilton Si, MD;  Location: [facility] INVASIVE CV LAB;  Service: Cardiovascular;  Laterality: N/A;   FEMUR IM NAIL Right 07/15/2012   Procedure: INTRAMEDULLARY (IM) NAIL HIP;  Surgeon: Eulas Post, MD;  Location: [facility] OR;  Service: Orthopedics;  Laterality: Right;   FEMUR IM NAIL Left 12/02/2015   Procedure: INTRAMEDULLARY (IM) NAIL FEMORAL;  Surgeon: Eldred Manges, MD;  Location: [facility] OR;  Service: Orthopedics;  Laterality: Left;   FRACTURE SURGERY     INCISION AND DRAINAGE HIP Left 01/03/2016   Procedure: IRRIGATION AND DEBRIDEMENT HIP;  Surgeon: Eldred Manges, MD;  Location: [facility] OR;  Service: Orthopedics;  Laterality: Left;   IR FLUORO GUIDE CV LINE RIGHT  09/14/2022   IR FLUORO GUIDE CV LINE RIGHT  09/18/2022   IR REMOVAL TUN CV CATH W/O FL  10/12/2022   IR US GUIDE VASC ACCESS RIGHT  09/14/2022   SHOULDER SURGERY Bilateral    "don't remember which side"    Allergies: Procaine hcl and Amoxicillin  Medications: Prior to Admission medications   Medication Sig Start Date End Date Taking? Authorizing Provider  alendronate (FOSAMAX) 35 MG tablet Take 35 mg by mouth every 7 (seven) days. Take with a full glass of water on an empty stomach.   Yes [provider]  amiodarone (PACERONE) 200 MG tablet Take 1 tablet (200 mg total) by mouth daily. 08/13/22  Yes Zannie Cove, MD  apixaban (ELIQUIS) 2.5 MG TABS tablet  Take 1 tablet (2.5 mg total) by mouth 2 (two) times daily. To start likely on Friday 6/14 if recommended by Coumadin clinic after labs done 08/21/22  Yes Chilton Si, MD  atorvastatin (LIPITOR) 40 MG tablet Take 1 tablet (40 mg total) by mouth daily. 09/24/22  Yes Burnadette Pop, MD  ezetimibe (ZETIA) 10 MG tablet Take 1 tablet (10 mg total) by mouth daily. 12/04/21  Yes Chilton Si, MD  insulin NPH-regular Human (70-30) 100 UNIT/ML injection Inject 5-10 Units into the skin See admin instructions. Give 10 units subcutaneously before breakfast and 5 units before dinner   Yes [provider]  Multiple Vitamins-Minerals (MULTIVITAMIN ADULT PO) Take 1 tablet by mouth daily with breakfast.    Yes [provider]  nystatin (MYCOSTATIN/NYSTOP) powder Apply 1 Application topically 2 (two) times daily. 10/05/22  Yes [provider]  pantoprazole (PROTONIX) 40 MG tablet Take 1 tablet (40 mg total) by mouth daily. 09/24/22  Yes Burnadette Pop, MD  polyethylene glycol (MIRALAX / GLYCOLAX) 17 g packet Take 17 g by mouth daily. 09/24/22  Yes Burnadette Pop, MD  VITAMIN D, CHOLECALCIFEROL, PO Take 1 tablet by mouth daily.   Yes [provider]     Family History  Problem Relation Age of Onset   Diabetes Father    Heart attack Father 91   Diabetes Sister    Drug abuse Son    Cancer Neg Hx        no hx of colon or breast cancer    Social History   Socioeconomic History   Marital status: Married    Spouse name: [name]   Number of children: 1   Years of education: College   Highest education level: Not on file  Occupational History   Occupation: retired     Associate Professor: RETIRED  Tobacco Use   Smoking status: Former    Current packs/day: 0.00    Types: Cigarettes    Quit date: 03/02/1978    Years since quitting: 44.6   Smokeless tobacco: Never  Vaping Use   Vaping status: Never Used  Substance and Sexual Activity   Alcohol use: No    Alcohol/week: 0.0  standard drinks of alcohol   Drug use: No   Sexual activity: Not on file  Other Topics Concern   Not on file  Social History Narrative   Patient lives at home spouse. Uses a walker consistently.   Moved from [place] 2004   1 son, problems w/ drugs, passed away 11-Mar-2016-- OD       Social Determinants of Health   Financial Resource Strain: Low Risk  (08/22/2020)   Overall Financial Resource Strain (CARDIA)    Difficulty of Paying Living Expenses: Not hard at all  Food Insecurity: No Food Insecurity (09/06/2022)   Hunger Vital Sign    Worried About Running Out of Food in the Last Year: Never true    Ran Out  of Food in the Last Year: Never true  Transportation Needs: No Transportation Needs (09/06/2022)   PRAPARE - Administrator, Civil Service (Medical): No    Lack of Transportation (Non-Medical): No  Physical Activity: Inactive (08/22/2020)   Exercise Vital Sign    Days of Exercise per Week: 0 days    Minutes of Exercise per Session: 0 min  Stress: No Stress Concern Present (08/22/2020)   [employer] of Occupational Health - Occupational Stress Questionnaire    Feeling of Stress : Not at all  Social Connections: Moderately Integrated (08/22/2020)   Social Connection and Isolation Panel [NHANES]    Frequency of Communication with Friends and Family: More than three times a week    Frequency of Social Gatherings with Friends and Family: More than three times a week    Attends Religious Services: More than 4 times per year    Active Member of [employer] or Organizations: No    Attends Banker Meetings: Never    Marital Status: Married    Review of Systems: A 12 point ROS discussed and pertinent positives are indicated in the HPI above.  All other systems are negative.  Vital Signs: BP (!) 142/67 (BP Location: Right Arm)   Pulse 75   Temp (!) 97.3 F (36.3 C)   Resp 17   Wt 172 lb 9.9 oz (78.3 kg)   SpO2 95%   BMI 25.49 kg/m   Advance Care Plan: The advanced  care plan/surrogate decision maker was discussed at the time of visit and documented in the medical record.    Physical Exam Vitals reviewed.  HENT:     Mouth/Throat:     Mouth: Mucous membranes are moist.  Cardiovascular:     Rate and Rhythm: Normal rate and regular rhythm.     Heart sounds: Normal heart sounds.  Pulmonary:     Effort: Pulmonary effort is normal.     Breath sounds: Normal breath sounds. No wheezing.  Abdominal:     Palpations: Abdomen is soft.  Musculoskeletal:        General: Normal range of motion.  Skin:    General: Skin is warm.  Neurological:     Mental Status: She is alert and oriented to person, place, and time.  Psychiatric:        Behavior: Behavior normal.     Imaging: IR Removal Tun Cv Cath W/O FL  Result Date: 10/12/2022 INDICATION: Patient with history of ESRD on HD via right IJ HD catheter placed 09/18/22 in IR. Admitted with bacteremia. Request for removal of tunneled HD catheter for line holiday. EXAM: REMOVAL OF TUNNELED HEMODIALYSIS CATHETER MEDICATIONS: None COMPLICATIONS: None immediate. PROCEDURE: Informed written consent was obtained from the patient following an explanation of the procedure, risks, benefits and alternatives to treatment. A time out was performed prior to the initiation of the procedure. Maximal barrier sterile technique was utilized including caps, mask, sterile gowns, sterile gloves, large sterile drape, hand hygiene, and ChloraPrep. Utilizing gentle traction, the catheter was removed intact. Hemostasis was obtained with manual compression. A dressing was placed. The patient tolerated the procedure well without immediate post procedural complication. IMPRESSION: Successful removal of tunneled dialysis catheter. Performed by Lynnette Caffey, PA-C Electronically Signed   By: Acquanetta Belling M.D.   On: 10/12/2022 14:08   DG CHEST PORT 1 VIEW  Result Date: 10/11/2022 CLINICAL DATA:  Follow-up pulmonary edema EXAM: PORTABLE CHEST 1  VIEW COMPARISON:  10/08/2022 FINDINGS: Cardiomegaly with mild interstitial  edema, similar. Small bilateral pleural effusions, left greater than right, grossly unchanged. No pneumothorax. Right IJ dual lumen dialysis catheter terminating at the cavoatrial junction. IMPRESSION: Cardiomegaly with mild interstitial edema and small bilateral pleural effusions, grossly unchanged. Electronically Signed   By: Charline Bills M.D.   On: 10/11/2022 17:58   DG CHEST PORT 1 VIEW  Result Date: 10/08/2022 CLINICAL DATA:  Status post CPR EXAM: PORTABLE CHEST 1 VIEW COMPARISON:  Chest radiograph dated 10/07/2022 FINDINGS: Lines/tubes: Right internal jugular venous catheter tip projects over the right atrium. Lungs: Low lung volumes with increased bilateral interstitial and left-greater-than-right patchy opacities. Pleura: Small bilateral pleural effusions.  No pneumothorax. Heart/mediastinum: Similar enlarged cardiomediastinal silhouette. Surgical clips project over the upper right mediastinum. Bones: Unchanged right lateral second and third rib fractures. Additional rib fractures are better evaluated on prior CT. IMPRESSION: 1. Low lung volumes with increased bilateral interstitial and left-greater-than-right patchy opacities, which may represent a combination of pulmonary edema, atelectasis, or superimposed aspiration/pneumonia. 2. Small bilateral pleural effusions. Electronically Signed   By: Agustin Cree M.D.   On: 10/08/2022 09:06   CT Chest Wo Contrast  Result Date: 10/07/2022 CLINICAL DATA:  Chest pain, status post cardiac arrest with CPR EXAM: CT CHEST WITHOUT CONTRAST TECHNIQUE: Multidetector CT imaging of the chest was performed following the standard protocol without IV contrast. RADIATION DOSE REDUCTION: This exam was performed according to the departmental dose-optimization program which includes automated exposure control, adjustment of the mA and/or kV according to patient size and/or use of iterative  reconstruction technique. COMPARISON:  Chest radiograph dated 10/07/2022. CT chest dated 07/20/2022. FINDINGS: Cardiovascular: Cardiomegaly ventricular hypertrophy and associated wall calcification. No pericardial effusion. Hypodense blood pool relative to myocardium, suggesting anemia. No evidence of thoracic aortic aneurysm. Atherosclerotic calcifications of the aortic arch. Moderate 3 vessel coronary atherosclerosis. Right IJ dual lumen dialysis catheter terminating in the upper right atrium. Mediastinum/Nodes: No suspicious mediastinal lymphadenopathy. High density in the distal esophagus (series 3/images 91 and 120) are presumed to reflect ingested debris. Visualized thyroid is unremarkable. Lungs/Pleura: Moderate left and small right pleural effusions. Associated compressive atelectasis in the bilateral lower lobes. Mild patchy opacities in the posterior upper lobes, likely atelectasis. Technically speaking, superimposed mild multifocal aspiration is possible. Platelike scarring in the right middle lobe. Mild linear scarring/atelectasis in the lingula. Mild loculated fluid along the right major fissure (for example, series 5/image 57). Stable 5 x 3 mm elongated nodule in the right middle lobe (series 5/image 81), benign. No follow-up is required per Fleischner Society guidelines. No pneumothorax. Upper Abdomen: Visualized upper abdomen is notable for a tiny hiatal hernia. Musculoskeletal: Nondisplaced right lateral 2nd and 3rd rib fractures. Nondisplaced left anterolateral 3rd and 4th rib fractures. These are presumably related to resuscitation. Mild superior endplate compression fracture deformity at T11, chronic. IMPRESSION: Nondisplaced right 2nd, bilateral 3rd, and left 4th rib fractures, presumably related to resuscitation. No pneumothorax. Moderate left and small right pleural effusions. Associated compressive atelectasis in the bilateral lower lobes. Superimposed mild dependent patchy opacities in the  lungs bilaterally. Technically speaking, mild aspiration is possible. Additional ancillary findings as above. Aortic Atherosclerosis (ICD10-I70.0). Electronically Signed   By: Charline Bills M.D.   On: 10/07/2022 19:13   CT Head Wo Contrast  Result Date: 10/07/2022 CLINICAL DATA:  Altered mental status EXAM: CT HEAD WITHOUT CONTRAST TECHNIQUE: Contiguous axial images were obtained from the base of the skull through the vertex without intravenous contrast. RADIATION DOSE REDUCTION: This exam was performed according to the departmental  dose-optimization program which includes automated exposure control, adjustment of the mA and/or kV according to patient size and/or use of iterative reconstruction technique. COMPARISON:  MRI brain dated 09/06/2022 FINDINGS: Brain: No evidence of acute infarction, hemorrhage, hydrocephalus, extra-axial collection or mass lesion/mass effect. 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 Subcortical white matter and periventricular small vessel ischemic changes. Vascular: Intracranial atherosclerosis. Skull: Normal. Negative for fracture or focal lesion. Sinuses/Orbits: The visualized paranasal sinuses are essentially clear. The mastoid air cells are unopacified. Other: None. IMPRESSION: No acute intracranial abnormality. Chronic left temporal encephalomalacia. Small vessel ischemic changes. Electronically Signed   By: Charline Bills M.D.   On: 10/07/2022 19:00   ECHOCARDIOGRAM COMPLETE  Result Date: 10/07/2022    ECHOCARDIOGRAM REPORT   Patient Name:   [name] [name] Date of Exam: 10/07/2022 Medical Rec #:  [id number]         Height:       69.0 in Accession #:    [id number]        Weight:       170.4 lb Date of Birth:  1941-02-22        BSA:          1.930 m Patient Age:    81 years          BP:           125/81 mmHg Patient Gender: F                 HR:           138 bpm. Exam Location:  Inpatient Procedure: 2D  Echo, Cardiac Doppler, Color Doppler and Intracardiac            Opacification Agent STAT ECHO Indications:    Cardiac Arrest  History:        Patient has prior history of Echocardiogram examinations, most                 recent 08/05/2022. CHF, CAD, CKD and Stroke, Arrythmias:Atrial                 Fibrillation; Risk Factors:Hypertension, Dyslipidemia and                 Diabetes.  Sonographer:    Wallie Char Referring Phys: [id number] Steffanie Dunn  Sonographer Comments: Image acquisition challenging due to patient body habitus. Messaged MD @ 3:15pm IMPRESSIONS  1. Left ventricular ejection fraction, by estimation, is 25 to 30%. The left ventricle has severely decreased function. The left ventricle demonstrates global hypokinesis. Left ventricular diastolic function could not be evaluated.  2. Right ventricular systolic function was not well visualized. The right ventricular size is normal. There is moderately elevated pulmonary artery systolic pressure. The estimated right ventricular systolic pressure is 57.6 mmHg.  3. The mitral valve is degenerative. Trivial mitral valve regurgitation. No evidence of mitral stenosis. Moderate mitral annular calcification.  4. The aortic valve is tricuspid. Aortic valve regurgitation is not visualized. Aortic valve sclerosis/calcification is present, without any evidence of aortic stenosis. Aortic valve area, by VTI measures 2.52 cm. Aortic valve mean gradient measures 3.0 mmHg. Aortic valve Vmax measures 1.06 m/s.  5. The inferior vena cava is dilated in size with >50% respiratory variability, suggesting right atrial pressure of 8 mmHg. FINDINGS  Left Ventricle: Left ventricular ejection fraction, by estimation, is 25 to 30%. The left ventricle has severely decreased function. The left ventricle demonstrates global hypokinesis. Definity contrast  agent was given IV to delineate the left ventricular endocardial borders. The left ventricular internal cavity size was normal in  size. There is no left ventricular hypertrophy. Left ventricular diastolic function could not be evaluated due to atrial fibrillation. Left ventricular diastolic function could not be evaluated. Right Ventricle: The right ventricular size is normal. No increase in right ventricular wall thickness. Right ventricular systolic function was not well visualized. There is moderately elevated pulmonary artery systolic pressure. The tricuspid regurgitant velocity is 3.52 m/s, and with an assumed right atrial pressure of 8 mmHg, the estimated right ventricular systolic pressure is 57.6 mmHg. Left Atrium: Left atrial size was normal in size. Right Atrium: Right atrial size was normal in size. Pericardium: There is no evidence of pericardial effusion. Mitral Valve: The mitral valve is degenerative in appearance. There is moderate thickening of the mitral valve leaflet(s). There is moderate calcification of the mitral valve leaflet(s). Moderate mitral annular calcification. Trivial mitral valve regurgitation. No evidence of mitral valve stenosis. MV peak gradient, 5.3 mmHg. The mean mitral valve gradient is 3.0 mmHg. Tricuspid Valve: The tricuspid valve is normal in structure. Tricuspid valve regurgitation is mild . No evidence of tricuspid stenosis. Aortic Valve: The aortic valve is tricuspid. Aortic valve regurgitation is not visualized. Aortic valve sclerosis/calcification is present, without any evidence of aortic stenosis. Aortic valve mean gradient measures 3.0 mmHg. Aortic valve peak gradient measures 4.5 mmHg. Aortic valve area, by VTI measures 2.52 cm. Pulmonic Valve: The pulmonic valve was normal in structure. Pulmonic valve regurgitation is not visualized. No evidence of pulmonic stenosis. Aorta: The aortic root is normal in size and structure. Venous: The inferior vena cava is dilated in size with greater than 50% respiratory variability, suggesting right atrial pressure of 8 mmHg. IAS/Shunts: No atrial level shunt  detected by color flow Doppler.  LEFT VENTRICLE PLAX 2D LVIDd:         4.10 cm      Diastology LVIDs:         3.80 cm      LV e' medial:  5.80 cm/s LV PW:         0.80 cm      LV e' lateral: 11.40 cm/s LV IVS:        0.80 cm LVOT diam:     1.90 cm LV SV:         46 LV SV Index:   24 LVOT Area:     2.84 cm  LV Volumes (MOD) LV vol d, MOD A2C: 142.0 ml LV vol d, MOD A4C: 83.1 ml LV vol s, MOD A2C: 91.6 ml LV vol s, MOD A4C: 58.4 ml LV SV MOD A2C:     50.4 ml LV SV MOD A4C:     83.1 ml LV SV MOD BP:      37.1 ml RIGHT VENTRICLE             IVC RV Basal diam:  3.10 cm     IVC diam: 2.10 cm RV S prime:     13.20 cm/s TAPSE (M-mode): 1.1 cm LEFT ATRIUM             Index        RIGHT ATRIUM           Index LA diam:        3.50 cm 1.81 cm/m   RA Area:     11.30 cm LA Vol (A2C):   37.9 ml 19.64 ml/m  RA Volume:  20.20 ml  10.47 ml/m LA Vol (A4C):   52.8 ml 27.36 ml/m LA Biplane Vol: 47.2 ml 24.45 ml/m  AORTIC VALVE AV Area (Vmax):    2.70 cm AV Area (Vmean):   2.17 cm AV Area (VTI):     2.52 cm AV Vmax:           105.50 cm/s AV Vmean:          84.950 cm/s AV VTI:            0.182 m AV Peak Grad:      4.5 mmHg AV Mean Grad:      3.0 mmHg LVOT Vmax:         100.40 cm/s LVOT Vmean:        65.100 cm/s LVOT VTI:          0.161 m LVOT/AV VTI ratio: 0.89  AORTA Ao Root diam: 3.30 cm Ao Asc diam:  3.30 cm MITRAL VALVE              TRICUSPID VALVE MV Area (PHT): 5.64 cm   TV Peak grad:   54.5 mmHg MV Area VTI:   3.52 cm   TV Vmax:        3.69 m/s MV Peak grad:  5.3 mmHg   TR Peak grad:   49.6 mmHg MV Mean grad:  3.0 mmHg   TR Vmax:        352.00 cm/s MV Vmax:       1.15 m/s MV Vmean:      78.2 cm/s  SHUNTS                           Systemic VTI:  0.16 m                           Systemic Diam: 1.90 cm Armanda Magic MD Electronically signed by Armanda Magic MD Signature Date/Time: 10/07/2022/3:33:15 PM    Final    DG Chest Portable 1 View  Result Date: 10/07/2022 CLINICAL DATA:  chest pain s/p CPR cardiac arrest EXAM:  PORTABLE CHEST 1 VIEW COMPARISON:  09/11/2022. FINDINGS: There are findings suggestive of congestive heart failure/pulmonary edema, grossly similar to the prior study. Redemonstration of left retrocardiac airspace opacity obscuring the left hemidiaphragm, descending thoracic aorta and blunting the left lateral costophrenic angle suggesting combination of left lower lobe atelectasis and/or consolidation with pleural effusion. No significant interval change. Right lateral costophrenic angle is clear. Stable cardio-mediastinal silhouette. No acute osseous abnormalities. The soft tissues are within normal limits. Right IJ hemodialysis catheter noted with its tip overlying the cavoatrial junction region. IMPRESSION: 1. Congestive heart failure/pulmonary edema. 2. Left lower lobe atelectasis and/or consolidation with pleural effusion. Electronically Signed   By: Jules Schick M.D.   On: 10/07/2022 14:39   IR Fluoro Guide CV Line Right  Result Date: 09/18/2022 INDICATION: Conversion of temporary hemodialysis catheter to tunneled hemodialysis catheter. EXAM: IR RIGHT FLUORO GUIDE CV LINE MEDICATIONS: 2 g Ancef; The antibiotic was administered within an appropriate time interval prior to skin puncture. ANESTHESIA/SEDATION: Moderate (conscious) sedation was employed during this procedure. A total of Versed 1 mg and Fentanyl 75 mcg was administered intravenously. Moderate Sedation Time: 12 minutes. The patient's level of consciousness and vital signs were monitored continuously by radiology nursing throughout the procedure under my direct supervision. FLUOROSCOPY TIME:  Radiation exposure index: 1 mGy reference air kerma COMPLICATIONS: None immediate.  PROCEDURE: Informed written consent was obtained from the patient after a thorough discussion of the procedural risks, benefits and alternatives. All questions were addressed. Maximal Sterile Barrier Technique was utilized including caps, mask, sterile gowns, sterile gloves,  sterile drape, hand hygiene and skin antiseptic. A timeout was performed prior to the initiation of the procedure. The existing right IJ temporary hemodialysis catheter was completely prepped and draped. A 0.035 wire was advanced through the central line lumen of the tunneled catheter, through the right atrium and into the inferior vena cava. Local anesthesia at the catheter insertion site was attained by infiltration with 1% lidocaine. Additionally, a suitable skin exit site inferior to the clavicle was anesthetized with 1% lidocaine. A small dermatotomy was made. Next, a 19 cm palindrome hemodialysis catheter was tunneled from the skin exit site to the dermatotomy where the existing temporary catheter enters the vein. Next, the existing catheter was removed over the wire. A peel-away sheath was then advanced over the wire and into the right heart. The [name] 19 cm palindrome hemodialysis catheter was then advanced through the peel-away sheath and the peel-away sheath was discarded. The catheter tip is in the right atrium. Images were obtained and stored for the medical record. The catheter flushes and aspirates easily. The catheter was flushed, locked with heparinized saline, capped and secured to the skin with 0 Prolene suture. Sterile bandages were applied. IMPRESSION: Successful exchange for a [name] 19 cm palindrome tunneled hemodialysis catheter. Catheter tips are in the right atrium and the device is ready for immediate use. Electronically Signed   By: Malachy Moan M.D.   On: 09/18/2022 12:46    Labs:  CBC: Recent Labs    10/12/22 0302 10/13/22 1052 10/14/22 0150 10/15/22 0400  WBC 10.8* 9.3 9.6 11.2*  HGB 8.7* 8.9* 9.1* 9.1*  HCT 27.8* 27.8* 28.8* 28.7*  PLT 278 318 332 323    COAGS: Recent Labs    08/12/22 0024 08/14/22 1138 09/06/22 1001 10/07/22 1816 10/08/22 0555  INR 3.9* 3.1* 2.0* 2.3*  --   APTT  --   --   --  43* 134*    BMP: Recent Labs    10/12/22 0302 10/13/22 1052  10/14/22 0150 10/15/22 0400  NA 131* 131* 131* 131*  K 3.6 4.3 4.9 4.4  CL 93* 96* 93* 95*  CO2 27 28 28 26   GLUCOSE 38* 148* 180* 124*  BUN 14 14 18  25*  CALCIUM 7.9* 7.8* 8.2* 8.1*  CREATININE 1.78* 1.85* 2.14* 2.49*  GFRNONAA 29* 27* 23* 19*    LIVER FUNCTION TESTS: Recent Labs    09/20/22 0425 09/23/22 1847 10/07/22 1529 10/07/22 1816 10/08/22 0555 10/09/22 0224 10/10/22 0407  BILITOT 0.5  --  0.8 0.8  --   --  1.1  AST 26  --  32 28  --   --  19  ALT 9  --  14 12  --   --  9  ALKPHOS 74  --  86 77  --   --  91  PROT 5.1*  --  5.5* 4.8*  --   --  5.6*  ALBUMIN <1.5*   < > <1.5* <1.5* <1.5* 2.0* 1.9*   < > = values in this interval not displayed.    TUMOR MARKERS: No results for input(s): "AFPTM", "CEA", "CA199", "CHROMGRNA" in the last 8760 hours.  Assessment and Plan:  Scheduled for tunneled dialysis catheter placement in IR 8/16 Check wbc first Risks and benefits discussed with the  patient including, but not limited to bleeding, infection, vascular injury, pneumothorax which may require chest tube placement, air embolism or even death  All of the patient's questions were answered, patient is agreeable to proceed. Consent signed and in chart.  Thank you for this interesting consult.  I greatly enjoyed meeting [name] [name] and look forward to participating in their care.  A copy of this report was sent to the requesting provider on this date.  Electronically Signed: Robet Leu, PA-C 10/15/2022, 11:22 AM   I spent a total of 20 Minutes    in face to face in clinical consultation, greater than 50% of which was counseling/coordinating care for tunn HD catheter placement

## 2022-10-15 NOTE — Progress Notes (Addendum)
Progress Note   Patient: [name] [name] AOZ:[id number] DOB: 1940-06-01 DOA: 10/07/2022     8 DOS: the patient was seen and examined on 10/15/2022   Brief hospital course: 81/F with chronic systolic CHF, ESRD recently started on hemodialysis last month, P atrial fibrillation, diabetes, CVA.  Recently residing at SNF/rehab.  She experienced a syncopal episode versus cardiac arrest while she was initiating hemodialysis on 8/7.  She received 8 minutes of CPR, no epinephrine.  Was not intubated, CT head reassuring, CT chest noted nondisplaced multiple rib fractures.  Patient transmitted required norepinephrine.  Echo showed chronic systolic CHF with EF 25% and seen by cardiology.  No further workup.  Transferred out of the ICU on 8/9.  Patient has been tolerating medications well, blood pressure is low but more stable therefore reducing midodrine.  She is tolerating HD well. Due to bacteremia, planned for HD access removal after HD 10/12/22. PT/OT evaluation pending for disposition.  Assessment and Plan: Cardiac arrest Unclear if this was syncopal episode versus cardiac arrest.  ROSC was achieved in 8 minutes.  Echocardiogram shows EF of 25% with moderately elevated PA pressures and RV was not well-visualized.  Seen by cardiology, no further workup indicated at this time. CT of the head shows left temporal lobe encephalomalacia which is chronic otherwise no acute pathology   Recurrent ESBL Klebsiella Bacteremia Sepsis - initially presented with high WBC, tachycardia, tachypnea. Sepsis resolved. Blood cultures positive for ESBL Klebsiella 10/07/22.  HD cathter removed.  Continue meropenem , previous cultures has grown ESBL.  Infectious disease evaluated her, advised 2 week therapy post catheter removal, switch to ertapenem 1gm with HD on dc.  HD catheter removed. Plan for replacement 8/16 after line holiday.   Chronic hypotension In the setting of ESRD, systolic CHF and severe hypoalbuminemia.  Was  transiently on IV pressors now off.   Reduce midodrine to 5 mg 3 times daily   Nondisplaced multiple rib fractures Secondary to chest compressions.  Has chest wall tenderness.  Will continue to monitor.  Pain control as necessary.  Allow time to self heal.  Lidocaine patch as needed.   Volume overload ESRD Severe hypoalbuminemia Nephrology team is following.   Continue HD as per nephrology. HD access replacement planned for 10/16/22 Received albumin.   Chronic systolic heart failure Seen by cardiology team.  Echo shows EF of 25%.  No further workup at this time   A-fib with RVR RVR on and off at night, continue amiodarone and Eliquis. Continue IV lopressor PRN.   Hyponatremia Stable around 130. Corrected with high sugars.   Hypokalemia/hypomagnesemia Replete as needed   Chronic anemia Hemoglobin stable around 9..s/p 1 unit PRBC transfusion on 8/9.   Insulin-dependent diabetes Hypoglycemia. Continue accucheks. Hypoglycemia protocol. Adjusted Sliding scale to very sensitive and Accu-Cheks.   Debility, frailty PT/OT follow up.   Palliative care team on board. She is DNR, SNF for rehab with palliative care service follow up suggested.       Subjective: Patient is seen and examined today morning. She is lying in bed, comfortably. Eating fair. No other complaints. TDC placement postponed. She is asking for her breakfast.  Physical Exam: Vitals:   10/14/22 1618 10/14/22 2036 10/15/22 0034 10/15/22 0601  BP: (!) 133/57 (!) 150/81 (!) 124/58 (!) 145/69  Pulse: 75 79 76 75  Resp: (!) 22 (!) 25 18 18   Temp: (!) 97.4 F (36.3 C) (!) 97.4 F (36.3 C) (!) 97.5 F (36.4 C) (!) 97.3 F (36.3 C)  TempSrc: Oral Oral Oral Axillary  SpO2: 96% 95% 93% 93%  Weight:       General - Elderly Caucasian female, no apparent distress HEENT - PERRLA, EOMI, atraumatic head, non tender sinuses. Lung - Clear, diffuse rales. Heart - S1, S2 heard, no murmurs, rubs, trace pedal  edema Neuro - Alert, awake and oriented x 3, non focal exam. Skin - Warm and dry. Data Reviewed:     Latest Ref Rng & Units 10/15/2022    4:00 AM 10/14/2022    1:50 AM 10/13/2022   10:52 AM  CBC  WBC 4.0 - 10.5 K/uL 11.2  9.6  9.3   Hemoglobin 12.0 - 15.0 g/dL 9.1  9.1  8.9   Hematocrit 36.0 - 46.0 % 28.7  28.8  27.8   Platelets 150 - 400 K/uL 323  332  318        Latest Ref Rng & Units 10/15/2022    4:00 AM 10/14/2022    1:50 AM 10/13/2022   10:52 AM  BMP  Glucose 70 - 99 mg/dL 161  096  045   BUN 8 - 23 mg/dL 25  18  14    Creatinine 0.44 - 1.00 mg/dL 4.09  8.11  9.14   Sodium 135 - 145 mmol/L 131  131  131   Potassium 3.5 - 5.1 mmol/L 4.4  4.9  4.3   Chloride 98 - 111 mmol/L 95  93  96   CO2 22 - 32 mmol/L 26  28  28    Calcium 8.9 - 10.3 mg/dL 8.1  8.2  7.8      Family Communication: Patient's husband updated over phone yesterday, he understands above care plan.  Disposition: Status is: Inpatient Remains inpatient appropriate because: IV antibiotics for bacteremia, need HD catheter replacement.  Planned Discharge Destination: Home with Home Health    Time spent: 44 minutes  Author: Marcelino Duster, MD 10/15/2022 8:09 AM  For on call review [email or web address].

## 2022-10-15 NOTE — Plan of Care (Signed)
?  Problem: Clinical Measurements: ?Goal: Ability to maintain clinical measurements within normal limits will improve ?Outcome: Progressing ?Goal: Will remain free from infection ?Outcome: Progressing ?Goal: Diagnostic test results will improve ?Outcome: Progressing ?  ?

## 2022-10-16 ENCOUNTER — Inpatient Hospital Stay (HOSPITAL_COMMUNITY): Payer: Medicare Other

## 2022-10-16 ENCOUNTER — Other Ambulatory Visit: Payer: Self-pay

## 2022-10-16 DIAGNOSIS — I469 Cardiac arrest, cause unspecified: Secondary | ICD-10-CM | POA: Diagnosis not present

## 2022-10-16 HISTORY — PX: IR US GUIDE VASC ACCESS RIGHT: IMG2390

## 2022-10-16 HISTORY — PX: IR FLUORO GUIDE CV LINE RIGHT: IMG2283

## 2022-10-16 LAB — CBC
HCT: 27.9 % — ABNORMAL LOW (ref 36.0–46.0)
Hemoglobin: 8.7 g/dL — ABNORMAL LOW (ref 12.0–15.0)
MCH: 28.7 pg (ref 26.0–34.0)
MCHC: 31.2 g/dL (ref 30.0–36.0)
MCV: 92.1 fL (ref 80.0–100.0)
Platelets: 321 10*3/uL (ref 150–400)
RBC: 3.03 MIL/uL — ABNORMAL LOW (ref 3.87–5.11)
RDW: 19.4 % — ABNORMAL HIGH (ref 11.5–15.5)
WBC: 9.7 10*3/uL (ref 4.0–10.5)
nRBC: 0 % (ref 0.0–0.2)

## 2022-10-16 LAB — BASIC METABOLIC PANEL
Anion gap: 8 (ref 5–15)
BUN: 29 mg/dL — ABNORMAL HIGH (ref 8–23)
CO2: 28 mmol/L (ref 22–32)
Calcium: 8.1 mg/dL — ABNORMAL LOW (ref 8.9–10.3)
Chloride: 93 mmol/L — ABNORMAL LOW (ref 98–111)
Creatinine, Ser: 2.66 mg/dL — ABNORMAL HIGH (ref 0.44–1.00)
GFR, Estimated: 18 mL/min — ABNORMAL LOW (ref >60)
Glucose, Bld: 206 mg/dL — ABNORMAL HIGH (ref 70–99)
Potassium: 4.5 mmol/L (ref 3.5–5.1)
Sodium: 129 mmol/L — ABNORMAL LOW (ref 135–145)

## 2022-10-16 LAB — GLUCOSE, CAPILLARY
Glucose-Capillary: 149 mg/dL — ABNORMAL HIGH (ref 70–99)
Glucose-Capillary: 201 mg/dL — ABNORMAL HIGH (ref 70–99)
Glucose-Capillary: 188 mg/dL — ABNORMAL HIGH (ref 70–99)

## 2022-10-16 LAB — MAGNESIUM: Magnesium: 2.2 mg/dL (ref 1.7–2.4)

## 2022-10-16 MED ORDER — GELATIN ABSORBABLE 12-7 MM EX MISC
CUTANEOUS | Status: AC
  Filled 2022-10-16: qty 1

## 2022-10-16 MED ORDER — MIDAZOLAM HCL 2 MG/2ML IJ SOLN
INTRAMUSCULAR | Status: AC
  Filled 2022-10-16: qty 2

## 2022-10-16 MED ORDER — LIDOCAINE-EPINEPHRINE 1 %-1:100000 IJ SOLN
20.0000 mL | Freq: Once | INTRAMUSCULAR | Status: AC
  Administered 2022-10-16: 12 mL via INTRADERMAL
  Filled 2022-10-16: qty 20

## 2022-10-16 MED ORDER — HEPARIN SODIUM (PORCINE) 1000 UNIT/ML IJ SOLN
3200.0000 [IU] | Freq: Once | INTRAMUSCULAR | Status: AC
  Administered 2022-10-16: 3200 [IU]
  Filled 2022-10-16: qty 4

## 2022-10-16 MED ORDER — LIDOCAINE-EPINEPHRINE 1 %-1:100000 IJ SOLN
INTRAMUSCULAR | Status: AC
  Filled 2022-10-16: qty 1

## 2022-10-16 MED ORDER — CEFAZOLIN SODIUM-DEXTROSE 2-4 GM/100ML-% IV SOLN
INTRAVENOUS | Status: AC | PRN
Start: 2022-10-16 — End: 2022-10-16
  Administered 2022-10-16: 2 g via INTRAVENOUS

## 2022-10-16 MED ORDER — MIDAZOLAM HCL 2 MG/2ML IJ SOLN
INTRAMUSCULAR | Status: AC | PRN
  Administered 2022-10-16: .5 mg via INTRAVENOUS

## 2022-10-16 MED ORDER — FENTANYL CITRATE (PF) 100 MCG/2ML IJ SOLN
INTRAMUSCULAR | Status: AC
  Filled 2022-10-16: qty 2

## 2022-10-16 MED ORDER — HEPARIN SODIUM (PORCINE) 1000 UNIT/ML IJ SOLN
INTRAMUSCULAR | Status: AC
  Filled 2022-10-16: qty 10

## 2022-10-16 MED ORDER — HEPARIN SODIUM (PORCINE) 1000 UNIT/ML IJ SOLN
10000.0000 [IU] | Freq: Once | INTRAMUSCULAR | Status: DC
  Filled 2022-10-16: qty 10

## 2022-10-16 MED ORDER — FENTANYL CITRATE (PF) 100 MCG/2ML IJ SOLN
INTRAMUSCULAR | Status: AC | PRN
Start: 2022-10-16 — End: 2022-10-16
  Administered 2022-10-16 (×2): 25 ug via INTRAVENOUS

## 2022-10-16 MED ORDER — CEFAZOLIN SODIUM-DEXTROSE 2-4 GM/100ML-% IV SOLN
INTRAVENOUS | Status: AC
  Filled 2022-10-16: qty 100

## 2022-10-16 NOTE — Plan of Care (Signed)

## 2022-10-16 NOTE — Progress Notes (Signed)
Pharmacy Antibiotic Note  [name] [name] is a 82 y.o. female admitted on 10/07/2022 with ESBL Klebsiella pneumoniae bacteremia.  Patient has a history of ESBL E.coli UTI and ESBL Klebsiella pneumoniae bacteremia in 08/2022 and completed a course of meropenem. Patient also has ESRD, recently started on HD MWF 08/2022. Last HD was on 8/10. Pharmacy has been consulted for Meropenem dosing.  TDC removed 8/12. Per ID, planning for abx for 2 weeks post removal = 8/25. Planning switch to ertapenem for easier administration at discharge.   Plan: Continue Meropenem 500mg  IV Q24hr, after HD on HD days   Weight: 78.2 kg (172 lb 6.4 oz)  Temp (24hrs), Avg:97.9 F (36.6 C), Min:97.3 F (36.3 C), Max:98.5 F (36.9 C)  Recent Labs  Lab 10/12/22 0302 10/13/22 1052 10/14/22 0150 10/15/22 0400 10/16/22 0035  WBC 10.8* 9.3 9.6 11.2* 9.7  CREATININE 1.78* 1.85* 2.14* 2.49* 2.66*    Estimated Creatinine Clearance: 17.6 mL/min (A) (by C-G formula based on SCr of 2.66 mg/dL (H)).    Allergies  Allergen Reactions   Procaine Hcl Anaphylaxis   Amoxicillin Itching    Antimicrobials this admission: 8/7 Vancomycin x1 8/7 Cefepime x1  8/11 Meropenem >> 8/25    Microbiology results: 8/7 BCx 1/2-  ESBL Klebsiella pneumoniae 8/7 MRSA negative 8/13 Bcx NGTD   Thank you for allowing pharmacy to be a part of this patient's care.  Fredonia Highland, PharmD, BCPS, [facility] Clinical Pharmacist [phone] Please check AMION for all [facility] Pharmacy numbers 10/16/2022

## 2022-10-16 NOTE — Procedures (Signed)
Interventional Radiology Procedure Note  Procedure:   1.) Placement of a 19 cm Palindrome tunneled dialysis catheter via the right internal jugular.  Tips in the RA and ready for use.   2.) Placement of a right internal jugular dual-lumen tunneled PowerLine with tip at the cavoatrial junction.   Complications: None  Estimated Blood Loss: None  Recommendations: - Routine line care - lines ready for use  Signed,  Sterling Big, MD

## 2022-10-16 NOTE — Progress Notes (Signed)
Progress Note   Patient: [name] [name] ZOX:[id number] DOB: [birth year] DOA: 10/07/2022     9 DOS: the patient was seen and examined on 10/16/2022   Brief hospital course:  81/F with chronic systolic CHF, ESRD recently started on hemodialysis last month, P atrial fibrillation, diabetes, CVA.  Recently residing at SNF/rehab.  She experienced a syncopal episode versus cardiac arrest while she was initiating hemodialysis on 8/7.  She received 8 minutes of CPR, no epinephrine.  Was not intubated, CT head reassuring, CT chest noted nondisplaced multiple rib fractures.  Patient transmitted required norepinephrine.  Echo showed chronic systolic CHF with EF 25% and seen by cardiology.  No further workup.  Transferred out of the ICU on 8/9.  Patient has been tolerating medications well, blood pressure is low but more stable therefore reducing midodrine.  She is tolerating HD well. Due to bacteremia, planned for HD access removal after HD 10/12/22. PT/OT evaluation pending for disposition.      Assessment and Plan:  Cardiac arrest Unclear if this was syncopal episode versus cardiac arrest.  ROSC was achieved in 8 minutes.  Echocardiogram shows EF of 25% with moderately elevated PA pressures and RV was not well-visualized.  Seen by cardiology, no further workup indicated at this time. CT of the head shows left temporal lobe encephalomalacia which is chronic otherwise no acute pathology   Recurrent ESBL Klebsiella Bacteremia Sepsis - initially presented with high WBC, tachycardia, tachypnea. Sepsis resolved. Blood cultures positive for ESBL Klebsiella 10/07/22.  HD cathter removed.  Continue meropenem , previous cultures has grown ESBL.   Infectious disease evaluated her, advised 2 week therapy post catheter removal, switch to ertapenem 1gm with HD on dc. Currently on meropenem 500 mg IV every 24 hours after hemodialysis. HD catheter removed. Replaced 08/16   Chronic hypotension In the setting of ESRD,  systolic CHF and severe hypoalbuminemia.  Was transiently on IV pressors now off.   Reduce midodrine to 5 mg 3 times daily   Nondisplaced multiple rib fractures Secondary to chest compressions.  Has chest wall tenderness.  Will continue to monitor.  Pain control as necessary.  Allow time to self heal.  Lidocaine patch as needed.   Volume overload ESRD Severe hypoalbuminemia  Nephrology team is following.   Continue HD as per nephrology. HD access replaced on  10/16/22 Received albumin.   Chronic systolic heart failure Seen by cardiology team.  Echo shows EF of 25%.  No further workup at this time Continue renal replacement therapy for volume status management   A-fib with RVR RVR on and off at night, continue amiodarone and Eliquis. Continue IV lopressor PRN.   Hyponatremia Stable around 130. Most likely secondary to hypovolemia.   Hypokalemia/hypomagnesemia Replete as needed   Chronic anemia Hemoglobin stable around 9..s/p 1 unit PRBC transfusion on 8/9.   Insulin-dependent diabetes Hypoglycemia. Continue accucheks. Hypoglycemia protocol. Adjusted Sliding scale to very sensitive and Accu-Cheks.   Debility, frailty PT/OT follow up.   Palliative care team on board. She is DNR, SNF for rehab with palliative care service follow up suggested.            Subjective: Seen and examined at the bedside.  No new complaints  Physical Exam: Vitals:   10/16/22 1506 10/16/22 1514 10/16/22 1530 10/16/22 1600  BP: (!) 138/58 (!) 128/58 (!) 125/59 (!) 108/52  Pulse: 71 69 69 67  Resp: 19 18 20 17   Temp: 97.7 F (36.5 C)     TempSrc:  SpO2: 100% 100% 100% 100%  Weight: 76.3 kg       General - Elderly Caucasian female, no apparent distress HEENT - PERRLA, EOMI, atraumatic head, non tender sinuses. Lung - Clear, diffuse rales. Heart - S1, S2 heard, no murmurs, rubs, trace pedal edema Neuro - Alert, awake and oriented x 3, non focal exam. Skin - Warm and  dry.   Data Reviewed: Labs reviewed.  Sodium 129, hemoglobin 8.7 There are no new results to review at this time.  Family Communication: Plan of care discussed with patient at the bedside.  She verbalizes understanding and agrees with the plan.  Disposition: Status is: Inpatient Remains inpatient appropriate because: On IV antibiotics for ESBL Klebsiella infection  Planned Discharge Destination: Skilled nursing facility    Time spent: 33 minutes  Author: Lucile Shutters, MD 10/16/2022 4:22 PM  For on call review [email or web address].

## 2022-10-16 NOTE — Progress Notes (Signed)
[facility] Progress Note  Subjective: Feels well today with no complaints.  Vitals:   10/16/22 0840 10/16/22 0845 10/16/22 0850 10/16/22 0905  BP: (!) 124/52 (!) 122/48 (!) 120/47 (!) 112/43  Pulse: 70 77 76 78  Resp: 13 12 (!) 32 16  Temp:      TempSrc:      SpO2: 96% 98% 99% 98%  Weight:        Exam: GEN: lying in bed, nad ENT: no nasal discharge, mmm EYES: no scleral icterus, eomi CV: normal rate, no rub PULM: no iwob, bilateral chest rise ABD: NABS, non-distended SKIN: no rashes or jaundice EXT: trace edema, warm and well perfused     Home meds include - alendronate, amiodarone, apixaban, atrovastatin, ezetimibe, insulin nph-regular, MVI, nystatin, pantoprazole, miralax, vit D, hydrocortisone cream 2.5%        OP HD: MWF SW  3.5h   400/600   78kg  2/2 bath  RIJ TDC  Heparin none - last OP HD 8/05, post wt 78.1, only 1.8kg removed - started OP HD around 7/26 - venofer 100mg  three times per week IV w hd thru 8/28     Assessment/ Plan: Cardiac arrest - occurred during OP HD session. 4 rounds of CPR. Long hx of systolic HF and recently started on dialysis. Seen by cardiology.  Volume overload - marked volume overload/ edema and pulm edema by CXR on admission. Now improved with HD. Volume removal as able. Adjust EDW at DC GNR bacteremia - blood cx's from 8/07 back + for Klebsiella pna. Hx of ESBL in the past as well. ID consulted. HD cath removed 8/12. Replaced today.  Continue ertapenem per ID ESRD - on HD MWF. HD today then goal 72 hr line holiday.  Plan to resume HD today then next on Monday Anemia esrd -  got 1u prbcs and Hb now in 8-10 range. 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 Phos is low, holding any binders for now. Follow.  HFrEF - echo showed LVEF 25-30%. BP's too low for GDMT.  Chronic atrial fib - per cards IDDM Mouth pain: possible thrush. Magic mouthwash    Darnell Level  10/16/2022, 12:09 PM  Recent Labs  Lab  10/10/22 0407 10/11/22 0049 10/12/22 0302 10/13/22 1052 10/15/22 0400 10/16/22 0035  HGB 9.5*   < > 8.7*   < > 9.1* 8.7*  ALBUMIN 1.9*  --   --   --   --   --   CALCIUM 7.9*   < > 7.9*   < > 8.1* 8.1*  PHOS 1.8*  --  3.6  --   --   --   CREATININE 1.30*   < > 1.78*   < > 2.49* 2.66*  K 3.6   < > 3.6   < > 4.4 4.5   < > = values in this interval not displayed.   No results for input(s): "IRON", "TIBC", "FERRITIN" in the last 168 hours.  Inpatient medications:  amiodarone  200 mg Oral Daily   apixaban  2.5 mg Oral BID   Chlorhexidine Gluconate Cloth  6 each Topical Q0600   darbepoetin (ARANESP) injection - DIALYSIS  60 mcg Subcutaneous Q Fri-1800   heparin sodium (porcine)  10,000 Units Intracatheter Once   insulin aspart  0-5 Units Subcutaneous QHS   insulin aspart  0-6 Units Subcutaneous TID WC   magic mouthwash  5 mL Oral TID   midodrine  10 mg Oral  Q M,W,F-HD   midodrine  5 mg Oral TID    sodium chloride 10 mL/hr at 10/11/22 0700   meropenem (MERREM) IV 200 mL/hr at 10/16/22 1610   acetaminophen, docusate sodium, fentaNYL (SUBLIMAZE) injection, guaiFENesin, hydrALAZINE, ipratropium-albuterol, lidocaine, metoprolol tartrate, ondansetron (ZOFRAN) IV, oxyCODONE, phenol, polyethylene glycol, senna-docusate, traZODone

## 2022-10-16 NOTE — Progress Notes (Signed)
Received patient in bed .Awake,alert and oriented x 4.Consent verified.  Medicine given: Midodrine 10 mg.  Access used: Newly inserted Right Hd catheter.Dressing on date.  Duration of treatment: 3.5 hours.  Fluid removed: 2.6 liters out of 3.5 ordered.  Hemo comment:On her last hour of treatment,blood pressures were dropping down,thus 2.6 liters pulled from her.  Hand off to the patient's nurse:

## 2022-10-17 DIAGNOSIS — I469 Cardiac arrest, cause unspecified: Secondary | ICD-10-CM | POA: Diagnosis not present

## 2022-10-17 LAB — BASIC METABOLIC PANEL
Anion gap: 9 (ref 5–15)
BUN: 13 mg/dL (ref 8–23)
CO2: 28 mmol/L (ref 22–32)
Calcium: 7.7 mg/dL — ABNORMAL LOW (ref 8.9–10.3)
Chloride: 94 mmol/L — ABNORMAL LOW (ref 98–111)
Creatinine, Ser: 1.61 mg/dL — ABNORMAL HIGH (ref 0.44–1.00)
GFR, Estimated: 32 mL/min — ABNORMAL LOW (ref >60)
Glucose, Bld: 199 mg/dL — ABNORMAL HIGH (ref 70–99)
Potassium: 4.6 mmol/L (ref 3.5–5.1)
Sodium: 131 mmol/L — ABNORMAL LOW (ref 135–145)

## 2022-10-17 LAB — CBC
HCT: 29 % — ABNORMAL LOW (ref 36.0–46.0)
Hemoglobin: 9.2 g/dL — ABNORMAL LOW (ref 12.0–15.0)
MCH: 29.4 pg (ref 26.0–34.0)
MCHC: 31.7 g/dL (ref 30.0–36.0)
MCV: 92.7 fL (ref 80.0–100.0)
Platelets: 292 10*3/uL (ref 150–400)
RBC: 3.13 MIL/uL — ABNORMAL LOW (ref 3.87–5.11)
RDW: 19.8 % — ABNORMAL HIGH (ref 11.5–15.5)
WBC: 7.8 10*3/uL (ref 4.0–10.5)
nRBC: 0 % (ref 0.0–0.2)

## 2022-10-17 LAB — GLUCOSE, CAPILLARY
Glucose-Capillary: 189 mg/dL — ABNORMAL HIGH (ref 70–99)
Glucose-Capillary: 144 mg/dL — ABNORMAL HIGH (ref 70–99)

## 2022-10-17 LAB — MAGNESIUM: Magnesium: 1.7 mg/dL (ref 1.7–2.4)

## 2022-10-17 MED ORDER — DARBEPOETIN ALFA 60 MCG/0.3ML IJ SOSY
60.0000 ug | PREFILLED_SYRINGE | INTRAMUSCULAR | Status: DC
  Filled 2022-10-17: qty 0.3

## 2022-10-17 MED ORDER — MIDODRINE HCL 5 MG PO TABS: 5.0000 mg | ORAL_TABLET | Freq: Three times a day (TID) | ORAL | 3 refills | Status: DC

## 2022-10-17 MED ORDER — MIDODRINE HCL 10 MG PO TABS: 10.0000 mg | ORAL_TABLET | ORAL | Status: DC

## 2022-10-17 MED ORDER — ERTAPENEM IV (FOR PTA / DISCHARGE USE ONLY): 500.0000 mg | INTRAVENOUS | Status: DC

## 2022-10-17 NOTE — TOC Progression Note (Addendum)
Transition of Care [facility]) - Progression Note    Patient Details  Name: ROSETTE SUTHERLIN MRN: [id number] Date of Birth: May 06, 1940  [facility] CM/SW Contact  Patrice Paradise, LCSW Phone Number: 10/17/2022, 12:02 PM  Clinical Narrative:     CSW was alerted by MD that pt may be medically ready for DC today. CSW reached out to admission staff at [facility] and had to leave a VM.  CSW received a message from [facility] stating that pt can return with DC summary. CSW alerted MD.   [facility] team will continue to assist with discharge planning needs.   Expected Discharge Plan: Skilled Nursing Facility Barriers to Discharge: Continued Medical Work up  Expected Discharge Plan and Services       Living arrangements for the past 2 months: Skilled Nursing Facility                                       Social Determinants of Health (SDOH) Interventions SDOH Screenings   Food Insecurity: No Food Insecurity (10/16/2022)  Housing: Low Risk  (10/16/2022)  Transportation Needs: No Transportation Needs (10/16/2022)  Utilities: Not At Risk (10/16/2022)  Alcohol Screen: Low Risk  (08/22/2020)  Depression (PHQ2-9): Low Risk  (08/21/2022)  Financial Resource Strain: Low Risk  (08/22/2020)  Physical Activity: Inactive (08/22/2020)  Social Connections: Moderately Integrated (08/22/2020)  Stress: No Stress Concern Present (08/22/2020)  Tobacco Use: Medium Risk (10/16/2022)    Readmission Risk Interventions    08/06/2022    3:31 PM 07/23/2022   12:43 PM  Readmission Risk Prevention Plan  Transportation Screening Complete Complete  PCP or Specialist Appt within 5-7 Days  Complete  PCP or Specialist Appt within 3-5 Days Complete   Home Care Screening  Complete  Medication Review (RN CM)  Referral to Pharmacy  HRI or Home Care Consult Complete   Social Work Consult for Recovery Care Planning/Counseling --   Palliative Care Screening Not Applicable   Medication Review Furniture conservator/restorer) Complete

## 2022-10-17 NOTE — Progress Notes (Addendum)
Pt discharged back to [facility]. This RN attempt to call report for primary RN, No answer at [phone]. Will re-attempt in 10 minutes.

## 2022-10-17 NOTE — Discharge Summary (Signed)
Physician Discharge Summary   Patient: [name] [name] MRN: [id number] DOB: 13-Jul-1940  Admit date:     10/07/2022  Discharge date: 10/17/22  Discharge Physician: Charolotte Eke   PCP: [name], MD   Recommendations at discharge:    IV ertapenem through PICC until 8/25 Follow up with renal, ID, Cardiology and palliative care Suggest weaning of midodrine to attempt adding heart failure GDMT   Discharge Diagnoses: Principal Problem:   Cardiac arrest [facility]) Active Problems:   Severe obesity (BMI >= 40) (HCC)   Diabetes (HCC)   Acute systolic CHF (congestive heart failure) (HCC)   Atrial fibrillation with rapid ventricular response (HCC)   Anemia in chronic kidney disease (CKD)   ESRD on dialysis (HCC)   Hypokalemia   Cardiac arrest, cause unspecified (HCC)   Hemodialysis catheter infection (HCC)   ESBL (extended spectrum beta-lactamase) producing bacteria infection   Pressure injury of skin  Resolved Problems:   * No resolved hospital problems. [facility] Course:  81/F with chronic systolic CHF, ESRD recently started on hemodialysis last month, P atrial fibrillation, diabetes, CVA.  Recently residing at SNF/rehab.  She experienced a syncopal episode versus cardiac arrest while she was initiating hemodialysis on 8/7.  She received 8 minutes of CPR, no epinephrine.  Was not intubated, CT head reassuring, CT chest noted nondisplaced multiple rib fractures.  Patient transmitted required norepinephrine.  Echo showed chronic systolic CHF with EF 25% and seen by cardiology.  No further workup.  Transferred out of the ICU on 8/9.  Patient has been tolerating medications well, blood pressure is low but more stable therefore reducing midodrine.  She is tolerating HD well. Due to bacteremia, planned for HD access removal after HD 10/12/22. S/p PICC for ertapenem and HD line on 8/16.   Assessment and Plan:  Cardiac arrest HFrEF Unclear if this was syncopal episode versus cardiac  arrest.  ROSC was achieved in 8 minutes.  Echocardiogram shows EF of 25% with moderately elevated PA pressures and RV was not well-visualized.  Seen by cardiology, no further workup indicated at this time.CT of the head shows left temporal lobe encephalomalacia which is chronic otherwise no acute pathology. No evidence of arrhythmia precipitating event.    Recurrent ESBL Klebsiella Bacteremia Sepsis  Initially presented with high WBC, tachycardia, tachypnea. Blood cultures positive for ESBL Klebsiella 10/07/22.  HD cathter removed.  Continue meropenem , previous cultures has grown ESBL.   -Infectious disease evaluated her, advised 2 week therapy post catheter removal, switch to ertapenem 1gm with HD on dc. -HD catheter removed. Replaced 08/16   Chronic hypotension In the setting of ESRD, systolic CHF and severe hypoalbuminemia.   -Reduce midodrine to 5 mg 3 times daily, 10 mg on HD days -trial weaning of midodrine    Nondisplaced multiple rib fractures Secondary to chest compressions.  Has chest wall tenderness.  Will continue to monitor.  Pain control as necessary.  Allow time to self heal.  Lidocaine patch as needed.   Volume overload ESRD Severe hypoalbuminemia Nephrology team is following.   Continue HD as per nephrology. HD access replaced on  10/16/22   Chronic systolic heart failure Seen by cardiology team.  Echo shows EF of 25%.  No further workup at this time, not able to tolerate GDMT due to hypotension. -cardiology follow up   A-fib with RVR RVR on and off at night, continue amiodarone and Eliquis.   Hyponatremia Stable around 130. Most likely secondary to hypovolemia.  Hypokalemia/hypomagnesemia Replete as needed   Chronic anemia Hemoglobin stable around 9..s/p 1 unit PRBC transfusion on 8/9.   Insulin-dependent diabetes Hypoglycemia. Continue accucheks. Hypoglycemia protocol. Adjusted Sliding scale to very sensitive and Accu-Cheks.   Debility, frailty PT/OT  follow up.      Consultants: ID, palliative, renal, cardiology Procedures performed: HD  Disposition: Skilled nursing facility Diet recommendation:  Discharge Diet Orders (From admission, onward)     Start     Ordered   10/17/22 0000  Diet - low sodium heart healthy        10/17/22 1258           Cardiac diet DISCHARGE MEDICATION: Allergies as of 10/17/2022       Reactions   Procaine Hcl Anaphylaxis   Amoxicillin Itching        Medication List     TAKE these medications    alendronate 35 MG tablet Commonly known as: FOSAMAX Take 35 mg by mouth every 7 (seven) days. Take with a full glass of water on an empty stomach.   amiodarone 200 MG tablet Commonly known as: PACERONE Take 1 tablet (200 mg total) by mouth daily.   apixaban 2.5 MG Tabs tablet Commonly known as: ELIQUIS Take 1 tablet (2.5 mg total) by mouth 2 (two) times daily. To start likely on Friday 6/14 if recommended by Coumadin clinic after labs done   atorvastatin 40 MG tablet Commonly known as: LIPITOR Take 1 tablet (40 mg total) by mouth daily.   ertapenem IVPB Commonly known as: INVANZ Inject 500 mg into the vein daily for 10 days. Indication:  ESBL Kleb PNA bacteremia First Dose: Yes Last Day of Therapy:  10/25/22 Labs - Once weekly:  CBC/D and BMP, Method of administration: Mini-Bag Plus / Gravity Method of administration may be changed at the discretion of home infusion pharmacist based upon assessment of the patient and/or caregiver's ability to self-administer the medication ordered.   ezetimibe 10 MG tablet Commonly known as: ZETIA Take 1 tablet (10 mg total) by mouth daily.   insulin NPH-regular Human (70-30) 100 UNIT/ML injection Inject 5-10 Units into the skin See admin instructions. Give 10 units subcutaneously before breakfast and 5 units before dinner   midodrine 5 MG tablet Commonly known as: PROAMATINE Take 1 tablet (5 mg total) by mouth 3 (three) times daily.   midodrine  10 MG tablet Commonly known as: PROAMATINE Take 1 tablet (10 mg total) by mouth every Monday, Wednesday, and Friday with hemodialysis. Start taking on: October 19, 2022   MULTIVITAMIN ADULT PO Take 1 tablet by mouth daily with breakfast.   nystatin powder Commonly known as: MYCOSTATIN/NYSTOP Apply 1 Application topically 2 (two) times daily.   pantoprazole 40 MG tablet Commonly known as: PROTONIX Take 1 tablet (40 mg total) by mouth daily.   polyethylene glycol 17 g packet Commonly known as: MIRALAX / GLYCOLAX Take 17 g by mouth daily.   VITAMIN D (CHOLECALCIFEROL) PO Take 1 tablet by mouth daily.               Home Infusion Instuctions  (From admission, onward)           Start     Ordered   10/17/22 0000  Home infusion instructions       Question:  Instructions  Answer:  Flushing of vascular access device: 0.9% NaCl pre/post medication administration and prn patency; Heparin 100 u/ml, 5ml for implanted ports and Heparin 10u/ml, 5ml for all other central venous catheters.  10/17/22 1258            Contact information for after-discharge care     Destination     [facility] Preferred SNF .   Service: Skilled Nursing Contact information: [address] [address] [phone]                    Discharge Exam: Ceasar Mons Weights   10/16/22 1859 10/16/22 2114 10/17/22 0539  Weight: 74 kg 74 kg 73.5 kg   GEN: lying in bed, nad ENT: no nasal discharge, mmm EYES: no scleral icterus, eomi CV: normal rate, no rub PULM: no iwob, bilateral chest rise ABD: NABS, non-distended SKIN: no rashes or jaundice EXT: trace edema, warm and well perfused  Condition at discharge: good  The results of significant diagnostics from this hospitalization (including imaging, microbiology, ancillary and laboratory) are listed below for reference.   Imaging Studies: IR Fluoro Guide CV Line Right  Result Date:  10/16/2022 INDICATION: 82 year old female with end-stage renal disease on hemodialysis. She recently suffered from a tunneled catheter site infection requiring line holiday. Now she requires placement of a new tunneled HD catheter as well as placement of a tunneled central line for long-term antibiotic therapy. EXAM: TUNNELED CENTRAL VENOUS HEMODIALYSIS CATHETER PLACEMENT WITH ULTRASOUND AND FLUOROSCOPIC GUIDANCE TUNNELED CENTRAL CATHETER PLACEMENT WITH ULTRASOUND AND FLUOROSCOPIC GUIDANCE. MEDICATIONS: In patient receiving antibiotic therapy. No additional prophylaxis given. ANESTHESIA/SEDATION: Moderate (conscious) sedation was employed during this procedure. A total of Versed 0.5 mg and Fentanyl 50 mcg was administered intravenously. Moderate Sedation Time: 27 minutes. The patient's level of consciousness and vital signs were monitored continuously by radiology nursing throughout the procedure under my direct supervision. FLUOROSCOPY TIME:  Radiation exposure index: 1 mGy air kerma COMPLICATIONS: None immediate. PROCEDURE: Informed written consent was obtained from the patient after a discussion of the risks, benefits, and alternatives to treatment. Questions regarding the procedure were encouraged and answered. The right neck and chest were prepped with chlorhexidine in a sterile fashion, and a sterile drape was applied covering the operative field. Maximum barrier sterile technique with sterile gowns and gloves were used for the procedure. A timeout was performed prior to the initiation of the procedure. After creating a small venotomy incision, a micropuncture kit was utilized to access the right internal jugular vein under direct, real-time ultrasound guidance after the overlying soft tissues were anesthetized with 1% lidocaine with epinephrine. Ultrasound image documentation was performed. The microwire was kinked to measure appropriate catheter length. A stiff Glidewire was advanced to the level of the IVC  and the micropuncture sheath was exchanged for a peel-away sheath. A palindrome tunneled hemodialysis catheter measuring 19 cm from tip to cuff was tunneled in a retrograde fashion from the anterior chest wall to the venotomy incision. The catheter was then placed through the peel-away sheath with tips ultimately positioned within the superior aspect of the right atrium. Final catheter positioning was confirmed and documented with a spot radiographic image. The catheter aspirates and flushes normally. The catheter was flushed with appropriate volume heparin dwells. The right internal jugular vein was interrogated with ultrasound and found to be patent with some nonocclusive thrombus. An image was obtained and stored for the medical record. Local anesthesia was attained by infiltration with 1% lidocaine. A small dermatotomy was made. Under real-time sonographic guidance, the vessel was punctured with a 21 gauge micropuncture needle. Using standard technique, the initial micro needle was exchanged over a 0.018 micro wire  for a transitional 4 [place] micro sheath. A peel-away sheath was then advanced over the wire. A suitable skin exit site inferior to the clavicle was selected and anesthetized with 1% lidocaine. A small dermatotomy was made. The dual lumen tunneled power line was then tunneled from the skin exit site to the dermatotomy overlying the new venous access site. The catheter was then cut to 22 cm in total length and advanced through the peel-away sheath. The catheter tip was positioned at the cavoatrial junction. The catheter flushes and aspirates easily. The catheter was capped and flushed The catheter exit sites were secured with a 0-Prolene retention suture. The venotomy incisions were closed with Dermabond. Dressings were applied. The patient tolerated the procedure well without immediate post procedural complication. IMPRESSION: Successful placement of 19 cm tip to cuff tunneled hemodialysis catheter  via the right internal jugular vein with tips terminating within the superior aspect of the right atrium. The catheter is ready for immediate use. Successful placement of a tunneled dual lumen power injectable central venous catheter also via the right internal jugular vein. This catheter tip is at the superior cavoatrial junction and is ready for immediate use. Electronically Signed   By: Malachy Moan M.D.   On: 10/16/2022 14:52   IR Fluoro Guide CV Line Right  Result Date: 10/16/2022 INDICATION: 82 year old female with end-stage renal disease on hemodialysis. She recently suffered from a tunneled catheter site infection requiring line holiday. Now she requires placement of a new tunneled HD catheter as well as placement of a tunneled central line for long-term antibiotic therapy. EXAM: TUNNELED CENTRAL VENOUS HEMODIALYSIS CATHETER PLACEMENT WITH ULTRASOUND AND FLUOROSCOPIC GUIDANCE TUNNELED CENTRAL CATHETER PLACEMENT WITH ULTRASOUND AND FLUOROSCOPIC GUIDANCE. MEDICATIONS: In patient receiving antibiotic therapy. No additional prophylaxis given. ANESTHESIA/SEDATION: Moderate (conscious) sedation was employed during this procedure. A total of Versed 0.5 mg and Fentanyl 50 mcg was administered intravenously. Moderate Sedation Time: 27 minutes. The patient's level of consciousness and vital signs were monitored continuously by radiology nursing throughout the procedure under my direct supervision. FLUOROSCOPY TIME:  Radiation exposure index: 1 mGy air kerma COMPLICATIONS: None immediate. PROCEDURE: Informed written consent was obtained from the patient after a discussion of the risks, benefits, and alternatives to treatment. Questions regarding the procedure were encouraged and answered. The right neck and chest were prepped with chlorhexidine in a sterile fashion, and a sterile drape was applied covering the operative field. Maximum barrier sterile technique with sterile gowns and gloves were used for the  procedure. A timeout was performed prior to the initiation of the procedure. After creating a small venotomy incision, a micropuncture kit was utilized to access the right internal jugular vein under direct, real-time ultrasound guidance after the overlying soft tissues were anesthetized with 1% lidocaine with epinephrine. Ultrasound image documentation was performed. The microwire was kinked to measure appropriate catheter length. A stiff Glidewire was advanced to the level of the IVC and the micropuncture sheath was exchanged for a peel-away sheath. A palindrome tunneled hemodialysis catheter measuring 19 cm from tip to cuff was tunneled in a retrograde fashion from the anterior chest wall to the venotomy incision. The catheter was then placed through the peel-away sheath with tips ultimately positioned within the superior aspect of the right atrium. Final catheter positioning was confirmed and documented with a spot radiographic image. The catheter aspirates and flushes normally. The catheter was flushed with appropriate volume heparin dwells. The right internal jugular vein was interrogated with ultrasound and found to be patent with  some nonocclusive thrombus. An image was obtained and stored for the medical record. Local anesthesia was attained by infiltration with 1% lidocaine. A small dermatotomy was made. Under real-time sonographic guidance, the vessel was punctured with a 21 gauge micropuncture needle. Using standard technique, the initial micro needle was exchanged over a 0.018 micro wire for a transitional 4 [place] micro sheath. A peel-away sheath was then advanced over the wire. A suitable skin exit site inferior to the clavicle was selected and anesthetized with 1% lidocaine. A small dermatotomy was made. The dual lumen tunneled power line was then tunneled from the skin exit site to the dermatotomy overlying the new venous access site. The catheter was then cut to 22 cm in total length and advanced  through the peel-away sheath. The catheter tip was positioned at the cavoatrial junction. The catheter flushes and aspirates easily. The catheter was capped and flushed The catheter exit sites were secured with a 0-Prolene retention suture. The venotomy incisions were closed with Dermabond. Dressings were applied. The patient tolerated the procedure well without immediate post procedural complication. IMPRESSION: Successful placement of 19 cm tip to cuff tunneled hemodialysis catheter via the right internal jugular vein with tips terminating within the superior aspect of the right atrium. The catheter is ready for immediate use. Successful placement of a tunneled dual lumen power injectable central venous catheter also via the right internal jugular vein. This catheter tip is at the superior cavoatrial junction and is ready for immediate use. Electronically Signed   By: Malachy Moan M.D.   On: 10/16/2022 14:52   IR US Guide Vasc Access Right  Result Date: 10/16/2022 INDICATION: 82 year old female with end-stage renal disease on hemodialysis. She recently suffered from a tunneled catheter site infection requiring line holiday. Now she requires placement of a new tunneled HD catheter as well as placement of a tunneled central line for long-term antibiotic therapy. EXAM: TUNNELED CENTRAL VENOUS HEMODIALYSIS CATHETER PLACEMENT WITH ULTRASOUND AND FLUOROSCOPIC GUIDANCE TUNNELED CENTRAL CATHETER PLACEMENT WITH ULTRASOUND AND FLUOROSCOPIC GUIDANCE. MEDICATIONS: In patient receiving antibiotic therapy. No additional prophylaxis given. ANESTHESIA/SEDATION: Moderate (conscious) sedation was employed during this procedure. A total of Versed 0.5 mg and Fentanyl 50 mcg was administered intravenously. Moderate Sedation Time: 27 minutes. The patient's level of consciousness and vital signs were monitored continuously by radiology nursing throughout the procedure under my direct supervision. FLUOROSCOPY TIME:  Radiation  exposure index: 1 mGy air kerma COMPLICATIONS: None immediate. PROCEDURE: Informed written consent was obtained from the patient after a discussion of the risks, benefits, and alternatives to treatment. Questions regarding the procedure were encouraged and answered. The right neck and chest were prepped with chlorhexidine in a sterile fashion, and a sterile drape was applied covering the operative field. Maximum barrier sterile technique with sterile gowns and gloves were used for the procedure. A timeout was performed prior to the initiation of the procedure. After creating a small venotomy incision, a micropuncture kit was utilized to access the right internal jugular vein under direct, real-time ultrasound guidance after the overlying soft tissues were anesthetized with 1% lidocaine with epinephrine. Ultrasound image documentation was performed. The microwire was kinked to measure appropriate catheter length. A stiff Glidewire was advanced to the level of the IVC and the micropuncture sheath was exchanged for a peel-away sheath. A palindrome tunneled hemodialysis catheter measuring 19 cm from tip to cuff was tunneled in a retrograde fashion from the anterior chest wall to the venotomy incision. The catheter was then placed through the peel-away  sheath with tips ultimately positioned within the superior aspect of the right atrium. Final catheter positioning was confirmed and documented with a spot radiographic image. The catheter aspirates and flushes normally. The catheter was flushed with appropriate volume heparin dwells. The right internal jugular vein was interrogated with ultrasound and found to be patent with some nonocclusive thrombus. An image was obtained and stored for the medical record. Local anesthesia was attained by infiltration with 1% lidocaine. A small dermatotomy was made. Under real-time sonographic guidance, the vessel was punctured with a 21 gauge micropuncture needle. Using standard  technique, the initial micro needle was exchanged over a 0.018 micro wire for a transitional 4 [place] micro sheath. A peel-away sheath was then advanced over the wire. A suitable skin exit site inferior to the clavicle was selected and anesthetized with 1% lidocaine. A small dermatotomy was made. The dual lumen tunneled power line was then tunneled from the skin exit site to the dermatotomy overlying the new venous access site. The catheter was then cut to 22 cm in total length and advanced through the peel-away sheath. The catheter tip was positioned at the cavoatrial junction. The catheter flushes and aspirates easily. The catheter was capped and flushed The catheter exit sites were secured with a 0-Prolene retention suture. The venotomy incisions were closed with Dermabond. Dressings were applied. The patient tolerated the procedure well without immediate post procedural complication. IMPRESSION: Successful placement of 19 cm tip to cuff tunneled hemodialysis catheter via the right internal jugular vein with tips terminating within the superior aspect of the right atrium. The catheter is ready for immediate use. Successful placement of a tunneled dual lumen power injectable central venous catheter also via the right internal jugular vein. This catheter tip is at the superior cavoatrial junction and is ready for immediate use. Electronically Signed   By: Malachy Moan M.D.   On: 10/16/2022 14:52   IR US Guide Vasc Access Right  Result Date: 10/16/2022 INDICATION: 82 year old female with end-stage renal disease on hemodialysis. She recently suffered from a tunneled catheter site infection requiring line holiday. Now she requires placement of a new tunneled HD catheter as well as placement of a tunneled central line for long-term antibiotic therapy. EXAM: TUNNELED CENTRAL VENOUS HEMODIALYSIS CATHETER PLACEMENT WITH ULTRASOUND AND FLUOROSCOPIC GUIDANCE TUNNELED CENTRAL CATHETER PLACEMENT WITH ULTRASOUND AND  FLUOROSCOPIC GUIDANCE. MEDICATIONS: In patient receiving antibiotic therapy. No additional prophylaxis given. ANESTHESIA/SEDATION: Moderate (conscious) sedation was employed during this procedure. A total of Versed 0.5 mg and Fentanyl 50 mcg was administered intravenously. Moderate Sedation Time: 27 minutes. The patient's level of consciousness and vital signs were monitored continuously by radiology nursing throughout the procedure under my direct supervision. FLUOROSCOPY TIME:  Radiation exposure index: 1 mGy air kerma COMPLICATIONS: None immediate. PROCEDURE: Informed written consent was obtained from the patient after a discussion of the risks, benefits, and alternatives to treatment. Questions regarding the procedure were encouraged and answered. The right neck and chest were prepped with chlorhexidine in a sterile fashion, and a sterile drape was applied covering the operative field. Maximum barrier sterile technique with sterile gowns and gloves were used for the procedure. A timeout was performed prior to the initiation of the procedure. After creating a small venotomy incision, a micropuncture kit was utilized to access the right internal jugular vein under direct, real-time ultrasound guidance after the overlying soft tissues were anesthetized with 1% lidocaine with epinephrine. Ultrasound image documentation was performed. The microwire was kinked to measure appropriate catheter length. A  stiff Glidewire was advanced to the level of the IVC and the micropuncture sheath was exchanged for a peel-away sheath. A palindrome tunneled hemodialysis catheter measuring 19 cm from tip to cuff was tunneled in a retrograde fashion from the anterior chest wall to the venotomy incision. The catheter was then placed through the peel-away sheath with tips ultimately positioned within the superior aspect of the right atrium. Final catheter positioning was confirmed and documented with a spot radiographic image. The  catheter aspirates and flushes normally. The catheter was flushed with appropriate volume heparin dwells. The right internal jugular vein was interrogated with ultrasound and found to be patent with some nonocclusive thrombus. An image was obtained and stored for the medical record. Local anesthesia was attained by infiltration with 1% lidocaine. A small dermatotomy was made. Under real-time sonographic guidance, the vessel was punctured with a 21 gauge micropuncture needle. Using standard technique, the initial micro needle was exchanged over a 0.018 micro wire for a transitional 4 [place] micro sheath. A peel-away sheath was then advanced over the wire. A suitable skin exit site inferior to the clavicle was selected and anesthetized with 1% lidocaine. A small dermatotomy was made. The dual lumen tunneled power line was then tunneled from the skin exit site to the dermatotomy overlying the new venous access site. The catheter was then cut to 22 cm in total length and advanced through the peel-away sheath. The catheter tip was positioned at the cavoatrial junction. The catheter flushes and aspirates easily. The catheter was capped and flushed The catheter exit sites were secured with a 0-Prolene retention suture. The venotomy incisions were closed with Dermabond. Dressings were applied. The patient tolerated the procedure well without immediate post procedural complication. IMPRESSION: Successful placement of 19 cm tip to cuff tunneled hemodialysis catheter via the right internal jugular vein with tips terminating within the superior aspect of the right atrium. The catheter is ready for immediate use. Successful placement of a tunneled dual lumen power injectable central venous catheter also via the right internal jugular vein. This catheter tip is at the superior cavoatrial junction and is ready for immediate use. Electronically Signed   By: Malachy Moan M.D.   On: 10/16/2022 14:52   IR Removal Tun Cv Cath  W/O FL  Result Date: 10/12/2022 INDICATION: Patient with history of ESRD on HD via right IJ HD catheter placed 09/18/22 in IR. Admitted with bacteremia. Request for removal of tunneled HD catheter for line holiday. EXAM: REMOVAL OF TUNNELED HEMODIALYSIS CATHETER MEDICATIONS: None COMPLICATIONS: None immediate. PROCEDURE: Informed written consent was obtained from the patient following an explanation of the procedure, risks, benefits and alternatives to treatment. A time out was performed prior to the initiation of the procedure. Maximal barrier sterile technique was utilized including caps, mask, sterile gowns, sterile gloves, large sterile drape, hand hygiene, and ChloraPrep. Utilizing gentle traction, the catheter was removed intact. Hemostasis was obtained with manual compression. A dressing was placed. The patient tolerated the procedure well without immediate post procedural complication. IMPRESSION: Successful removal of tunneled dialysis catheter. Performed by Lynnette Caffey, PA-C Electronically Signed   By: Acquanetta Belling M.D.   On: 10/12/2022 14:08   DG CHEST PORT 1 VIEW  Result Date: 10/11/2022 CLINICAL DATA:  Follow-up pulmonary edema EXAM: PORTABLE CHEST 1 VIEW COMPARISON:  10/08/2022 FINDINGS: Cardiomegaly with mild interstitial edema, similar. Small bilateral pleural effusions, left greater than right, grossly unchanged. No pneumothorax. Right IJ dual lumen dialysis catheter terminating at the cavoatrial junction. IMPRESSION: Cardiomegaly  with mild interstitial edema and small bilateral pleural effusions, grossly unchanged. Electronically Signed   By: Charline Bills M.D.   On: 10/11/2022 17:58   DG CHEST PORT 1 VIEW  Result Date: 10/08/2022 CLINICAL DATA:  Status post CPR EXAM: PORTABLE CHEST 1 VIEW COMPARISON:  Chest radiograph dated 10/07/2022 FINDINGS: Lines/tubes: Right internal jugular venous catheter tip projects over the right atrium. Lungs: Low lung volumes with increased bilateral  interstitial and left-greater-than-right patchy opacities. Pleura: Small bilateral pleural effusions.  No pneumothorax. Heart/mediastinum: Similar enlarged cardiomediastinal silhouette. Surgical clips project over the upper right mediastinum. Bones: Unchanged right lateral second and third rib fractures. Additional rib fractures are better evaluated on prior CT. IMPRESSION: 1. Low lung volumes with increased bilateral interstitial and left-greater-than-right patchy opacities, which may represent a combination of pulmonary edema, atelectasis, or superimposed aspiration/pneumonia. 2. Small bilateral pleural effusions. Electronically Signed   By: Agustin Cree M.D.   On: 10/08/2022 09:06   CT Chest Wo Contrast  Result Date: 10/07/2022 CLINICAL DATA:  Chest pain, status post cardiac arrest with CPR EXAM: CT CHEST WITHOUT CONTRAST TECHNIQUE: Multidetector CT imaging of the chest was performed following the standard protocol without IV contrast. RADIATION DOSE REDUCTION: This exam was performed according to the departmental dose-optimization program which includes automated exposure control, adjustment of the mA and/or kV according to patient size and/or use of iterative reconstruction technique. COMPARISON:  Chest radiograph dated 10/07/2022. CT chest dated 07/20/2022. FINDINGS: Cardiovascular: Cardiomegaly ventricular hypertrophy and associated wall calcification. No pericardial effusion. Hypodense blood pool relative to myocardium, suggesting anemia. No evidence of thoracic aortic aneurysm. Atherosclerotic calcifications of the aortic arch. Moderate 3 vessel coronary atherosclerosis. Right IJ dual lumen dialysis catheter terminating in the upper right atrium. Mediastinum/Nodes: No suspicious mediastinal lymphadenopathy. High density in the distal esophagus (series 3/images 91 and 120) are presumed to reflect ingested debris. Visualized thyroid is unremarkable. Lungs/Pleura: Moderate left and small right pleural  effusions. Associated compressive atelectasis in the bilateral lower lobes. Mild patchy opacities in the posterior upper lobes, likely atelectasis. Technically speaking, superimposed mild multifocal aspiration is possible. Platelike scarring in the right middle lobe. Mild linear scarring/atelectasis in the lingula. Mild loculated fluid along the right major fissure (for example, series 5/image 57). Stable 5 x 3 mm elongated nodule in the right middle lobe (series 5/image 81), benign. No follow-up is required per Fleischner Society guidelines. No pneumothorax. Upper Abdomen: Visualized upper abdomen is notable for a tiny hiatal hernia. Musculoskeletal: Nondisplaced right lateral 2nd and 3rd rib fractures. Nondisplaced left anterolateral 3rd and 4th rib fractures. These are presumably related to resuscitation. Mild superior endplate compression fracture deformity at T11, chronic. IMPRESSION: Nondisplaced right 2nd, bilateral 3rd, and left 4th rib fractures, presumably related to resuscitation. No pneumothorax. Moderate left and small right pleural effusions. Associated compressive atelectasis in the bilateral lower lobes. Superimposed mild dependent patchy opacities in the lungs bilaterally. Technically speaking, mild aspiration is possible. Additional ancillary findings as above. Aortic Atherosclerosis (ICD10-I70.0). Electronically Signed   By: Charline Bills M.D.   On: 10/07/2022 19:13   CT Head Wo Contrast  Result Date: 10/07/2022 CLINICAL DATA:  Altered mental status EXAM: CT HEAD WITHOUT CONTRAST TECHNIQUE: Contiguous axial images were obtained from the base of the skull through the vertex without intravenous contrast. RADIATION DOSE REDUCTION: This exam was performed according to the departmental dose-optimization program which includes automated exposure control, adjustment of the mA and/or kV according to patient size and/or use of iterative reconstruction technique. COMPARISON:  MRI brain  dated  09/06/2022 FINDINGS: Brain: No evidence of acute infarction, hemorrhage, hydrocephalus, extra-axial collection or mass lesion/mass effect. 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 Subcortical white matter and periventricular small vessel ischemic changes. Vascular: Intracranial atherosclerosis. Skull: Normal. Negative for fracture or focal lesion. Sinuses/Orbits: The visualized paranasal sinuses are essentially clear. The mastoid air cells are unopacified. Other: None. IMPRESSION: No acute intracranial abnormality. Chronic left temporal encephalomalacia. Small vessel ischemic changes. Electronically Signed   By: Charline Bills M.D.   On: 10/07/2022 19:00   ECHOCARDIOGRAM COMPLETE  Result Date: 10/07/2022    ECHOCARDIOGRAM REPORT   Patient Name:   TAQUISHA LASSETTER Date of Exam: 10/07/2022 Medical Rec #:  [id number]         Height:       69.0 in Accession #:    [id number]        Weight:       170.4 lb Date of Birth:  06/04/1940        BSA:          1.930 m Patient Age:    81 years          BP:           125/81 mmHg Patient Gender: F                 HR:           138 bpm. Exam Location:  Inpatient Procedure: 2D Echo, Cardiac Doppler, Color Doppler and Intracardiac            Opacification Agent STAT ECHO Indications:    Cardiac Arrest  History:        Patient has prior history of Echocardiogram examinations, most                 recent 08/05/2022. CHF, CAD, CKD and Stroke, Arrythmias:Atrial                 Fibrillation; Risk Factors:Hypertension, Dyslipidemia and                 Diabetes.  Sonographer:    Wallie Char Referring Phys: [id number] Steffanie Dunn  Sonographer Comments: Image acquisition challenging due to patient body habitus. Messaged MD @ 3:15pm IMPRESSIONS  1. Left ventricular ejection fraction, by estimation, is 25 to 30%. The left ventricle has severely decreased function. The left ventricle demonstrates global hypokinesis. Left  ventricular diastolic function could not be evaluated.  2. Right ventricular systolic function was not well visualized. The right ventricular size is normal. There is moderately elevated pulmonary artery systolic pressure. The estimated right ventricular systolic pressure is 57.6 mmHg.  3. The mitral valve is degenerative. Trivial mitral valve regurgitation. No evidence of mitral stenosis. Moderate mitral annular calcification.  4. The aortic valve is tricuspid. Aortic valve regurgitation is not visualized. Aortic valve sclerosis/calcification is present, without any evidence of aortic stenosis. Aortic valve area, by VTI measures 2.52 cm. Aortic valve mean gradient measures 3.0 mmHg. Aortic valve Vmax measures 1.06 m/s.  5. The inferior vena cava is dilated in size with >50% respiratory variability, suggesting right atrial pressure of 8 mmHg. FINDINGS  Left Ventricle: Left ventricular ejection fraction, by estimation, is 25 to 30%. The left ventricle has severely decreased function. The left ventricle demonstrates global hypokinesis. Definity contrast agent was given IV to delineate the left ventricular endocardial borders. The left ventricular internal cavity size was normal in size. There is no left ventricular  hypertrophy. Left ventricular diastolic function could not be evaluated due to atrial fibrillation. Left ventricular diastolic function could not be evaluated. Right Ventricle: The right ventricular size is normal. No increase in right ventricular wall thickness. Right ventricular systolic function was not well visualized. There is moderately elevated pulmonary artery systolic pressure. The tricuspid regurgitant velocity is 3.52 m/s, and with an assumed right atrial pressure of 8 mmHg, the estimated right ventricular systolic pressure is 57.6 mmHg. Left Atrium: Left atrial size was normal in size. Right Atrium: Right atrial size was normal in size. Pericardium: There is no evidence of pericardial effusion.  Mitral Valve: The mitral valve is degenerative in appearance. There is moderate thickening of the mitral valve leaflet(s). There is moderate calcification of the mitral valve leaflet(s). Moderate mitral annular calcification. Trivial mitral valve regurgitation. No evidence of mitral valve stenosis. MV peak gradient, 5.3 mmHg. The mean mitral valve gradient is 3.0 mmHg. Tricuspid Valve: The tricuspid valve is normal in structure. Tricuspid valve regurgitation is mild . No evidence of tricuspid stenosis. Aortic Valve: The aortic valve is tricuspid. Aortic valve regurgitation is not visualized. Aortic valve sclerosis/calcification is present, without any evidence of aortic stenosis. Aortic valve mean gradient measures 3.0 mmHg. Aortic valve peak gradient measures 4.5 mmHg. Aortic valve area, by VTI measures 2.52 cm. Pulmonic Valve: The pulmonic valve was normal in structure. Pulmonic valve regurgitation is not visualized. No evidence of pulmonic stenosis. Aorta: The aortic root is normal in size and structure. Venous: The inferior vena cava is dilated in size with greater than 50% respiratory variability, suggesting right atrial pressure of 8 mmHg. IAS/Shunts: No atrial level shunt detected by color flow Doppler.  LEFT VENTRICLE PLAX 2D LVIDd:         4.10 cm      Diastology LVIDs:         3.80 cm      LV e' medial:  5.80 cm/s LV PW:         0.80 cm      LV e' lateral: 11.40 cm/s LV IVS:        0.80 cm LVOT diam:     1.90 cm LV SV:         46 LV SV Index:   24 LVOT Area:     2.84 cm  LV Volumes (MOD) LV vol d, MOD A2C: 142.0 ml LV vol d, MOD A4C: 83.1 ml LV vol s, MOD A2C: 91.6 ml LV vol s, MOD A4C: 58.4 ml LV SV MOD A2C:     50.4 ml LV SV MOD A4C:     83.1 ml LV SV MOD BP:      37.1 ml RIGHT VENTRICLE             IVC RV Basal diam:  3.10 cm     IVC diam: 2.10 cm RV S prime:     13.20 cm/s TAPSE (M-mode): 1.1 cm LEFT ATRIUM             Index        RIGHT ATRIUM           Index LA diam:        3.50 cm 1.81 cm/m   RA  Area:     11.30 cm LA Vol (A2C):   37.9 ml 19.64 ml/m  RA Volume:   20.20 ml  10.47 ml/m LA Vol (A4C):   52.8 ml 27.36 ml/m LA Biplane Vol: 47.2 ml 24.45 ml/m  AORTIC VALVE AV  Area (Vmax):    2.70 cm AV Area (Vmean):   2.17 cm AV Area (VTI):     2.52 cm AV Vmax:           105.50 cm/s AV Vmean:          84.950 cm/s AV VTI:            0.182 m AV Peak Grad:      4.5 mmHg AV Mean Grad:      3.0 mmHg LVOT Vmax:         100.40 cm/s LVOT Vmean:        65.100 cm/s LVOT VTI:          0.161 m LVOT/AV VTI ratio: 0.89  AORTA Ao Root diam: 3.30 cm Ao Asc diam:  3.30 cm MITRAL VALVE              TRICUSPID VALVE MV Area (PHT): 5.64 cm   TV Peak grad:   54.5 mmHg MV Area VTI:   3.52 cm   TV Vmax:        3.69 m/s MV Peak grad:  5.3 mmHg   TR Peak grad:   49.6 mmHg MV Mean grad:  3.0 mmHg   TR Vmax:        352.00 cm/s MV Vmax:       1.15 m/s MV Vmean:      78.2 cm/s  SHUNTS                           Systemic VTI:  0.16 m                           Systemic Diam: 1.90 cm Armanda Magic MD Electronically signed by Armanda Magic MD Signature Date/Time: 10/07/2022/3:33:15 PM    Final    DG Chest Portable 1 View  Result Date: 10/07/2022 CLINICAL DATA:  chest pain s/p CPR cardiac arrest EXAM: PORTABLE CHEST 1 VIEW COMPARISON:  09/11/2022. FINDINGS: There are findings suggestive of congestive heart failure/pulmonary edema, grossly similar to the prior study. Redemonstration of left retrocardiac airspace opacity obscuring the left hemidiaphragm, descending thoracic aorta and blunting the left lateral costophrenic angle suggesting combination of left lower lobe atelectasis and/or consolidation with pleural effusion. No significant interval change. Right lateral costophrenic angle is clear. Stable cardio-mediastinal silhouette. No acute osseous abnormalities. The soft tissues are within normal limits. Right IJ hemodialysis catheter noted with its tip overlying the cavoatrial junction region. IMPRESSION: 1. Congestive heart  failure/pulmonary edema. 2. Left lower lobe atelectasis and/or consolidation with pleural effusion. Electronically Signed   By: Jules Schick M.D.   On: 10/07/2022 14:39   IR Fluoro Guide CV Line Right  Result Date: 09/18/2022 INDICATION: Conversion of temporary hemodialysis catheter to tunneled hemodialysis catheter. EXAM: IR RIGHT FLUORO GUIDE CV LINE MEDICATIONS: 2 g Ancef; The antibiotic was administered within an appropriate time interval prior to skin puncture. ANESTHESIA/SEDATION: Moderate (conscious) sedation was employed during this procedure. A total of Versed 1 mg and Fentanyl 75 mcg was administered intravenously. Moderate Sedation Time: 12 minutes. The patient's level of consciousness and vital signs were monitored continuously by radiology nursing throughout the procedure under my direct supervision. FLUOROSCOPY TIME:  Radiation exposure index: 1 mGy reference air kerma COMPLICATIONS: None immediate. PROCEDURE: Informed written consent was obtained from the patient after a thorough discussion of the procedural risks, benefits and alternatives. All questions were addressed. Maximal  Sterile Barrier Technique was utilized including caps, mask, sterile gowns, sterile gloves, sterile drape, hand hygiene and skin antiseptic. A timeout was performed prior to the initiation of the procedure. The existing right IJ temporary hemodialysis catheter was completely prepped and draped. A 0.035 wire was advanced through the central line lumen of the tunneled catheter, through the right atrium and into the inferior vena cava. Local anesthesia at the catheter insertion site was attained by infiltration with 1% lidocaine. Additionally, a suitable skin exit site inferior to the clavicle was anesthetized with 1% lidocaine. A small dermatotomy was made. Next, a 19 cm palindrome hemodialysis catheter was tunneled from the skin exit site to the dermatotomy where the existing temporary catheter enters the vein. Next, the  existing catheter was removed over the wire. A peel-away sheath was then advanced over the wire and into the right heart. The new 19 cm palindrome hemodialysis catheter was then advanced through the peel-away sheath and the peel-away sheath was discarded. The catheter tip is in the right atrium. Images were obtained and stored for the medical record. The catheter flushes and aspirates easily. The catheter was flushed, locked with heparinized saline, capped and secured to the skin with 0 Prolene suture. Sterile bandages were applied. IMPRESSION: Successful exchange for a new 19 cm palindrome tunneled hemodialysis catheter. Catheter tips are in the right atrium and the device is ready for immediate use. Electronically Signed   By: Malachy Moan M.D.   On: 09/18/2022 12:46    Microbiology: Results for orders placed or performed during the hospital encounter of 10/07/22  Blood culture (routine x 2)     Status: Abnormal   Collection Time: 10/07/22  3:29 PM   Specimen: BLOOD RIGHT ARM  Result Value Ref Range Status   Specimen Description BLOOD RIGHT ARM  Final   Special Requests NONE  Final   Culture  Setup Time   Final    GRAM NEGATIVE RODS AEROBIC BOTTLE ONLY CRITICAL RESULT CALLED TO, READ BACK BY AND VERIFIED WITH: PHARMD G. ABBOTT 10/10/22 @ 2224 BY AB    Culture (A)  Final    KLEBSIELLA PNEUMONIAE Confirmed Extended Spectrum Beta-Lactamase Producer (ESBL).  In bloodstream infections from ESBL organisms, carbapenems are preferred over piperacillin/tazobactam. They are shown to have a lower risk of mortality. MULTI-DRUG RESISTANT ORGANISM CRITICAL RESULT CALLED TO, READ BACK BY AND VERIFIED WITH: RN Bernadene Person [phone] BY Berline Chough, [place] Performed at [facility], [address] [place], [place] [address]    Report Status 10/12/2022 FINAL  Final   Organism ID, Bacteria KLEBSIELLA PNEUMONIAE  Final      Susceptibility   Klebsiella pneumoniae - MIC*    AMPICILLIN >=32 RESISTANT  Resistant     CEFEPIME >=32 RESISTANT Resistant     CEFTAZIDIME 32 RESISTANT Resistant     CEFTRIAXONE >=64 RESISTANT Resistant     CIPROFLOXACIN >=4 RESISTANT Resistant     GENTAMICIN >=16 RESISTANT Resistant     IMIPENEM 1 SENSITIVE Sensitive     TRIMETH/SULFA 160 RESISTANT Resistant     AMPICILLIN/SULBACTAM >=32 RESISTANT Resistant     PIP/TAZO 64 INTERMEDIATE Intermediate     * KLEBSIELLA PNEUMONIAE  Blood Culture ID Panel (Reflexed)     Status: Abnormal   Collection Time: 10/07/22  3:29 PM  Result Value Ref Range Status   Enterococcus faecalis NOT DETECTED NOT DETECTED Final   Enterococcus Faecium NOT DETECTED NOT DETECTED Final   Listeria monocytogenes NOT DETECTED NOT DETECTED Final  Staphylococcus species NOT DETECTED NOT DETECTED Final   Staphylococcus aureus (BCID) NOT DETECTED NOT DETECTED Final   Staphylococcus epidermidis NOT DETECTED NOT DETECTED Final   Staphylococcus lugdunensis NOT DETECTED NOT DETECTED Final   Streptococcus species NOT DETECTED NOT DETECTED Final   Streptococcus agalactiae NOT DETECTED NOT DETECTED Final   Streptococcus pneumoniae NOT DETECTED NOT DETECTED Final   Streptococcus pyogenes NOT DETECTED NOT DETECTED Final   A.calcoaceticus-baumannii NOT DETECTED NOT DETECTED Final   Bacteroides fragilis NOT DETECTED NOT DETECTED Final   Enterobacterales DETECTED (A) NOT DETECTED Final    Comment: Enterobacterales represent a large order of gram negative bacteria, not a single organism. CRITICAL RESULT CALLED TO, READ BACK BY AND VERIFIED WITH: PHARMD G. ABBOTT 10/10/22 @ 2224 BY AB    Enterobacter cloacae complex NOT DETECTED NOT DETECTED Final   Escherichia coli NOT DETECTED NOT DETECTED Final   Klebsiella aerogenes NOT DETECTED NOT DETECTED Final   Klebsiella oxytoca NOT DETECTED NOT DETECTED Final   Klebsiella pneumoniae DETECTED (A) NOT DETECTED Final    Comment: CRITICAL RESULT CALLED TO, READ BACK BY AND VERIFIED WITH: PHARMD G. ABBOTT  10/10/22 @ 2224 BY AB    Proteus species NOT DETECTED NOT DETECTED Final   Salmonella species NOT DETECTED NOT DETECTED Final   Serratia marcescens NOT DETECTED NOT DETECTED Final   Haemophilus influenzae NOT DETECTED NOT DETECTED Final   Neisseria meningitidis NOT DETECTED NOT DETECTED Final   Pseudomonas aeruginosa NOT DETECTED NOT DETECTED Final   Stenotrophomonas maltophilia NOT DETECTED NOT DETECTED Final   Candida albicans NOT DETECTED NOT DETECTED Final   Candida auris NOT DETECTED NOT DETECTED Final   Candida glabrata NOT DETECTED NOT DETECTED Final   Candida krusei NOT DETECTED NOT DETECTED Final   Candida parapsilosis NOT DETECTED NOT DETECTED Final   Candida tropicalis NOT DETECTED NOT DETECTED Final   Cryptococcus neoformans/gattii NOT DETECTED NOT DETECTED Final   CTX-M ESBL DETECTED (A) NOT DETECTED Final    Comment: CRITICAL RESULT CALLED TO, READ BACK BY AND VERIFIED WITH: PHARMD G. ABBOTT 10/10/22 @ 2224 BY AB (NOTE) Extended spectrum beta-lactamase detected. Recommend a carbapenem as initial therapy.      Carbapenem resistance IMP NOT DETECTED NOT DETECTED Final   Carbapenem resistance KPC NOT DETECTED NOT DETECTED Final   Carbapenem resistance NDM NOT DETECTED NOT DETECTED Final   Carbapenem resist OXA 48 LIKE NOT DETECTED NOT DETECTED Final   Carbapenem resistance VIM NOT DETECTED NOT DETECTED Final    Comment: Performed at [facility], [address], [place], [place] [address]  MRSA Next Gen by PCR, Nasal     Status: None   Collection Time: 10/07/22  5:19 PM   Specimen: Nasal Mucosa; Nasal Swab  Result Value Ref Range Status   MRSA by PCR Next Gen NOT DETECTED NOT DETECTED Final    Comment: (NOTE) The GeneXpert MRSA Assay (FDA approved for NASAL specimens only), is one component of a comprehensive MRSA colonization surveillance program. It is not intended to diagnose MRSA infection nor to guide or monitor treatment for MRSA infections. Test  performance is not FDA approved in patients less than 73 years old. Performed at [facility], [address], [place], [place] [address]   Culture, blood (Routine X 2) w Reflex to ID Panel     Status: None (Preliminary result)   Collection Time: 10/13/22 10:52 AM   Specimen: BLOOD  Result Value Ref Range Status   Specimen Description BLOOD LEFT  ANTECUBITAL  Final   Special Requests   Final    AEROBIC BOTTLE ONLY Blood Culture results may not be optimal due to an inadequate volume of blood received in culture bottles   Culture   Final    NO GROWTH 4 DAYS Performed at [facility], [address] [place], [place] [address]    Report Status PENDING  Incomplete  Culture, blood (Routine X 2) w Reflex to ID Panel     Status: None (Preliminary result)   Collection Time: 10/13/22 10:53 AM   Specimen: BLOOD  Result Value Ref Range Status   Specimen Description BLOOD BLOOD LEFT HAND  Final   Special Requests   Final    BOTTLES DRAWN AEROBIC AND ANAEROBIC Blood Culture adequate volume   Culture   Final    NO GROWTH 4 DAYS Performed at [facility], [address], [place], [place] [address]    Report Status PENDING  Incomplete   *Note: Due to a large number of results and/or encounters for the requested time period, some results have not been displayed. A complete set of results can be found in Results Review.    Labs: CBC: Recent Labs  Lab 10/13/22 1052 10/14/22 0150 10/15/22 0400 10/16/22 0035 10/17/22 0129  WBC 9.3 9.6 11.2* 9.7 7.8  HGB 8.9* 9.1* 9.1* 8.7* 9.2*  HCT 27.8* 28.8* 28.7* 27.9* 29.0*  MCV 90.3 90.6 91.7 92.1 92.7  PLT 318 332 323 321 292   Basic Metabolic Panel: Recent Labs  Lab 10/12/22 0302 10/13/22 1052 10/14/22 0150 10/15/22 0400 10/16/22 0035 10/17/22 0129  NA 131* 131* 131* 131* 129* 131*  K 3.6 4.3 4.9 4.4 4.5 4.6  CL 93* 96* 93* 95* 93* 94*  CO2 27 28 28 26 28 28   GLUCOSE 38* 148* 180* 124* 206* 199*  BUN 14 14 18  25* 29*  13  CREATININE 1.78* 1.85* 2.14* 2.49* 2.66* 1.61*  CALCIUM 7.9* 7.8* 8.2* 8.1* 8.1* 7.7*  MG 1.9 1.7 1.6* 1.6* 2.2 1.7  PHOS 3.6  --   --   --   --   --    Liver Function Tests: No results for input(s): "AST", "ALT", "ALKPHOS", "BILITOT", "PROT", "ALBUMIN" in the last 168 hours. CBG: Recent Labs  Lab 10/16/22 0618 10/16/22 1104 10/16/22 2120 10/17/22 0557 10/17/22 1131  GLUCAP 149* 201* 188* 189* 144*    Discharge time spent: greater than 30 minutes.  Signed: Charolotte Eke, MD [facility] 10/17/2022

## 2022-10-17 NOTE — Plan of Care (Signed)
Pt trained regarding medical regimen and being compliant. Pt state that she was tired. This left the room after administering medications

## 2022-10-17 NOTE — Plan of Care (Signed)
[facility] Patient Discharge Orders- [facility] CLINIC: [facility]   Patient's name: [name] Admit/DC Dates: 10/07/2022 - 10/17/2022  Discharge Diagnoses: Cardiac arrest/HFrEF  -evaluated by cardiology  -no further w/u indicated  ESBL Klebsiella bacteremia  - s/p line holiday. -IV ertapenem daily per ID (central line placed for this)   Aranesp: Given: --   Date and amount of last dose: --  Last Hgb: 9.2 PRBC's Given: Yes Date/# of units: 8/9   1 unit  ESA dose for discharge: mircera 60 mcg IV q 2 weeks  IV Iron dose at discharge: No change   Heparin change: --  New EDW: 75 kg   Bath Change: --  Access intervention/Change: s/p TDC exchange 10/16/22    Hectorol/Calcitriol change: --  Discharge Labs: Calcium 7.7  Phosphorus  Albumin  K+ 4.6  IV Antibiotics: Continue IV ertapenem at home per ID instructions    On Coumadin? No  OTHER/APPTS/LAB ORDERS:  **CODE STATUS CHANGED TO DNR**   D/C Meds to be reconciled by nurse after every discharge.  Completed By: Tomasa Blase PA-C    Reviewed by: MD:______ RN_______

## 2022-10-17 NOTE — Progress Notes (Signed)
[facility] Progress Note  Subjective: Seen in room - has 2 lines in her chest, starting to bother her. Otherwise no complaints. Dialysis went ok.   Vitals:   10/16/22 2347 10/17/22 0539 10/17/22 0807 10/17/22 0925  BP: (!) 141/58  136/84 (!) 133/58  Pulse: 69   77  Resp: 16  18 15   Temp: 98.1 F (36.7 C)  98 F (36.7 C) 97.7 F (36.5 C)  TempSrc: Oral  Oral Oral  SpO2: 100%  100% 100%  Weight:  73.5 kg    Height:        Exam: GEN: lying in bed, nad ENT: no nasal discharge, mmm EYES: no scleral icterus, eomi CV: normal rate, no rub PULM: no iwob, bilateral chest rise ABD: NABS, non-distended SKIN: no rashes or jaundice EXT: trace edema, warm and well perfused     Home meds include - alendronate, amiodarone, apixaban, atrovastatin, ezetimibe, insulin nph-regular, MVI, nystatin, pantoprazole, miralax, vit D, hydrocortisone cream 2.5%        OP HD: MWF SW  3.5h   400/600   78kg  2/2 bath  RIJ TDC  Heparin none - last OP HD 8/05, post wt 78.1, only 1.8kg removed - started OP HD around 7/26 - venofer 100mg  three times per week IV w hd thru 8/28     Assessment/ Plan: Cardiac arrest - occurred during OP HD session. 4 rounds of CPR. Long hx of systolic HF and recently started on dialysis. Seen by cardiology.  Volume overload - marked volume overload/ edema and pulm edema by CXR on admission. Now improved with HD. Volume removal as able. Adjust EDW at DC Klebsiella  bacteremia - blood cx's from 8/07 back + for Klebsiella pna. Hx of ESBL in the past as well. ID consulted. HD cath removed 8/12. Replaced 8/16.  Continue ertapenem per ID ESRD - on HD MWF.  Had HD Friday --- Next HD Monday  Anemia esrd -  got 1u prbcs and Hb now in 8-10 range. 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 Phos is low, holding any binders for now. Follow.  HFrEF - echo showed LVEF 25-30%. BP's too low for GDMT.  Chronic atrial fib - per cards IDDM Mouth pain: possible thrush.  Magic mouthwash Code status: Now DNR - communicate to outpatient unit at discharge    Tomasa Blase PA-C [facility] 10/17/2022,10:37 AM   Recent Labs  Lab 10/12/22 0302 10/13/22 1052 10/16/22 0035 10/17/22 0129  HGB 8.7*   < > 8.7* 9.2*  CALCIUM 7.9*   < > 8.1* 7.7*  PHOS 3.6  --   --   --   CREATININE 1.78*   < > 2.66* 1.61*  K 3.6   < > 4.5 4.6   < > = values in this interval not displayed.   No results for input(s): "IRON", "TIBC", "FERRITIN" in the last 168 hours.  Inpatient medications:  amiodarone  200 mg Oral Daily   apixaban  2.5 mg Oral BID   Chlorhexidine Gluconate Cloth  6 each Topical Q0600   darbepoetin (ARANESP) injection - DIALYSIS  60 mcg Subcutaneous Q Sat-1800   heparin sodium (porcine)  10,000 Units Intracatheter Once   insulin aspart  0-5 Units Subcutaneous QHS   insulin aspart  0-6 Units Subcutaneous TID WC   magic mouthwash  5 mL Oral TID   midodrine  10 mg Oral Q M,W,F-HD   midodrine  5 mg Oral TID  sodium chloride 10 mL/hr at 10/11/22 0700   meropenem (MERREM) IV 200 mL/hr at 10/16/22 9563   acetaminophen, docusate sodium, fentaNYL (SUBLIMAZE) injection, guaiFENesin, hydrALAZINE, ipratropium-albuterol, lidocaine, metoprolol tartrate, ondansetron (ZOFRAN) IV, oxyCODONE, phenol, polyethylene glycol, senna-docusate, traZODone

## 2022-10-17 NOTE — TOC Transition Note (Signed)
Transition of Care [facility]) - CM/SW Discharge Note   Patient Details  Name: [name] [name] MRN: [id number] Date of Birth: [birth year]  Transition of Care [facility]) CM/SW Contact:  Patrice Paradise, LCSW Phone Number: 10/17/2022, 12:58 PM   Clinical Narrative:     Patient will DC to:?[facility] Anticipated DC date:?10/17/2022 Family notified:?[name]-spouse Transport by: Sharin Mons   Per MD patient ready for DC to [facility]. RN, patient, patient's family, and facility notified of DC. Discharge Summary sent to facility. RN given number for report [phone]. . DC packet on chart. Ambulance transport requested for patient.   CSW signing off.   Judd Lien, [place] [phone]   Final next level of care: Skilled Nursing Facility Barriers to Discharge: Barriers Resolved   Patient Goals and CMS Choice CMS Medicare.gov Compare Post Acute Care list provided to:: Patient Choice offered to / list presented to : Patient, Spouse  Discharge Placement                Patient chooses bed at: [facility] Patient to be transferred to facility by: [facility] Name of family member notified: spouse  [phone] Patient and family notified of of transfer: 10/17/22  Discharge Plan and Services Additional resources added to the After Visit Summary for                                       Social Determinants of Health (SDOH) Interventions SDOH Screenings   Food Insecurity: No Food Insecurity (10/16/2022)  Housing: Low Risk  (10/16/2022)  Transportation Needs: No Transportation Needs (10/16/2022)  Utilities: Not At Risk (10/16/2022)  Alcohol Screen: Low Risk  (08/22/2020)  Depression (PHQ2-9): Low Risk  (08/21/2022)  Financial Resource Strain: Low Risk  (08/22/2020)  Physical Activity: Inactive (08/22/2020)  Social Connections: Moderately Integrated (08/22/2020)  Stress: No Stress Concern Present (08/22/2020)  Tobacco Use: Medium Risk (10/16/2022)     Readmission Risk Interventions     08/06/2022    3:31 PM 07/23/2022   12:43 PM  Readmission Risk Prevention Plan  Transportation Screening Complete Complete  PCP or Specialist Appt within 5-7 Days  Complete  PCP or Specialist Appt within 3-5 Days Complete   Home Care Screening  Complete  Medication Review (RN CM)  Referral to Pharmacy  HRI or Home Care Consult Complete   Social Work Consult for Recovery Care Planning/Counseling --   Palliative Care Screening Not Applicable   Medication Review [employer]) Complete

## 2022-10-19 NOTE — Telephone Encounter (Signed)
Pt has been in and out of hospital,  will check with pt at follow up in October.

## 2022-10-19 NOTE — Progress Notes (Signed)
Late Note Entry- October 19, 2022  Pt was d/c on Saturday. Contacted [facility] this morning to advise clinic of pt's d/c date and that pt should resume care today.   Olivia Canter Renal Navigator [phone]

## 2022-10-21 ENCOUNTER — Other Ambulatory Visit: Payer: Self-pay | Admitting: *Deleted

## 2022-10-21 NOTE — Patient Outreach (Signed)
[name] [name] resides in [facility] skilled nursing facility. [name] [name] was active with care coordinatin team prior.   Collaboration with Kirstin, [place] social worker to make aware care coordination team involved prior. [name] [name] recently readmitted back to [facility] from hospital.  Will continue to follow.   Raiford Noble, MSN, RN,BSN Post Acute Care Coordinator [phone] (Direct dial)

## 2022-10-26 ENCOUNTER — Emergency Department (HOSPITAL_COMMUNITY): Payer: Medicare Other

## 2022-10-26 ENCOUNTER — Inpatient Hospital Stay (HOSPITAL_COMMUNITY)
Admission: EM | Admit: 2022-10-26 | Discharge: 2022-10-30 | DRG: 871 | Disposition: A | Discharge status: Skilled Nursing Facility | Payer: Medicare Other | Source: Skilled Nursing Facility | Attending: Internal Medicine | Admitting: Internal Medicine

## 2022-10-26 ENCOUNTER — Ambulatory Visit: Payer: Medicare Other | Admitting: Infectious Disease

## 2022-10-26 ENCOUNTER — Encounter (HOSPITAL_COMMUNITY): Payer: Self-pay

## 2022-10-26 ENCOUNTER — Other Ambulatory Visit: Payer: Self-pay

## 2022-10-26 DIAGNOSIS — E11319 Type 2 diabetes mellitus with unspecified diabetic retinopathy without macular edema: Secondary | ICD-10-CM | POA: Diagnosis present

## 2022-10-26 DIAGNOSIS — R6510 Systemic inflammatory response syndrome (SIRS) of non-infectious origin without acute organ dysfunction: Secondary | ICD-10-CM

## 2022-10-26 DIAGNOSIS — Z515 Encounter for palliative care: Secondary | ICD-10-CM

## 2022-10-26 DIAGNOSIS — Z87891 Personal history of nicotine dependence: Secondary | ICD-10-CM

## 2022-10-26 DIAGNOSIS — E162 Hypoglycemia, unspecified: Secondary | ICD-10-CM

## 2022-10-26 DIAGNOSIS — A4189 Other specified sepsis: Secondary | ICD-10-CM | POA: Diagnosis present

## 2022-10-26 DIAGNOSIS — I5023 Acute on chronic systolic (congestive) heart failure: Secondary | ICD-10-CM | POA: Diagnosis present

## 2022-10-26 DIAGNOSIS — I9589 Other hypotension: Secondary | ICD-10-CM | POA: Diagnosis not present

## 2022-10-26 DIAGNOSIS — Z880 Allergy status to penicillin: Secondary | ICD-10-CM

## 2022-10-26 DIAGNOSIS — I480 Paroxysmal atrial fibrillation: Secondary | ICD-10-CM | POA: Diagnosis present

## 2022-10-26 DIAGNOSIS — Z66 Do not resuscitate: Secondary | ICD-10-CM | POA: Diagnosis present

## 2022-10-26 DIAGNOSIS — E039 Hypothyroidism, unspecified: Secondary | ICD-10-CM | POA: Diagnosis present

## 2022-10-26 DIAGNOSIS — G9341 Metabolic encephalopathy: Secondary | ICD-10-CM | POA: Diagnosis present

## 2022-10-26 DIAGNOSIS — R946 Abnormal results of thyroid function studies: Secondary | ICD-10-CM | POA: Diagnosis not present

## 2022-10-26 DIAGNOSIS — T68XXXA Hypothermia, initial encounter: Principal | ICD-10-CM

## 2022-10-26 DIAGNOSIS — J9601 Acute respiratory failure with hypoxia: Secondary | ICD-10-CM | POA: Diagnosis present

## 2022-10-26 DIAGNOSIS — D631 Anemia in chronic kidney disease: Secondary | ICD-10-CM | POA: Diagnosis present

## 2022-10-26 DIAGNOSIS — R6520 Severe sepsis without septic shock: Secondary | ICD-10-CM | POA: Diagnosis present

## 2022-10-26 DIAGNOSIS — M898X9 Other specified disorders of bone, unspecified site: Secondary | ICD-10-CM | POA: Diagnosis present

## 2022-10-26 DIAGNOSIS — N179 Acute kidney failure, unspecified: Secondary | ICD-10-CM | POA: Diagnosis present

## 2022-10-26 DIAGNOSIS — E11649 Type 2 diabetes mellitus with hypoglycemia without coma: Secondary | ICD-10-CM | POA: Diagnosis present

## 2022-10-26 DIAGNOSIS — Z87898 Personal history of other specified conditions: Secondary | ICD-10-CM | POA: Diagnosis not present

## 2022-10-26 DIAGNOSIS — R400 Somnolence: Secondary | ICD-10-CM

## 2022-10-26 DIAGNOSIS — Z7901 Long term (current) use of anticoagulants: Secondary | ICD-10-CM

## 2022-10-26 DIAGNOSIS — E1122 Type 2 diabetes mellitus with diabetic chronic kidney disease: Secondary | ICD-10-CM | POA: Diagnosis present

## 2022-10-26 DIAGNOSIS — K219 Gastro-esophageal reflux disease without esophagitis: Secondary | ICD-10-CM

## 2022-10-26 DIAGNOSIS — J1282 Pneumonia due to coronavirus disease 2019: Secondary | ICD-10-CM | POA: Diagnosis present

## 2022-10-26 DIAGNOSIS — Z8249 Family history of ischemic heart disease and other diseases of the circulatory system: Secondary | ICD-10-CM

## 2022-10-26 DIAGNOSIS — R569 Unspecified convulsions: Secondary | ICD-10-CM | POA: Diagnosis present

## 2022-10-26 DIAGNOSIS — H5462 Unqualified visual loss, left eye, normal vision right eye: Secondary | ICD-10-CM | POA: Diagnosis present

## 2022-10-26 DIAGNOSIS — E440 Moderate protein-calorie malnutrition: Secondary | ICD-10-CM | POA: Diagnosis present

## 2022-10-26 DIAGNOSIS — I132 Hypertensive heart and chronic kidney disease with heart failure and with stage 5 chronic kidney disease, or end stage renal disease: Secondary | ICD-10-CM | POA: Diagnosis present

## 2022-10-26 DIAGNOSIS — Z8661 Personal history of infections of the central nervous system: Secondary | ICD-10-CM

## 2022-10-26 DIAGNOSIS — N186 End stage renal disease: Secondary | ICD-10-CM

## 2022-10-26 DIAGNOSIS — Z813 Family history of other psychoactive substance abuse and dependence: Secondary | ICD-10-CM

## 2022-10-26 DIAGNOSIS — Z6824 Body mass index (BMI) 24.0-24.9, adult: Secondary | ICD-10-CM

## 2022-10-26 DIAGNOSIS — R410 Disorientation, unspecified: Secondary | ICD-10-CM | POA: Diagnosis not present

## 2022-10-26 DIAGNOSIS — E871 Hypo-osmolality and hyponatremia: Secondary | ICD-10-CM

## 2022-10-26 DIAGNOSIS — Z87892 Personal history of anaphylaxis: Secondary | ICD-10-CM

## 2022-10-26 DIAGNOSIS — Z79899 Other long term (current) drug therapy: Secondary | ICD-10-CM

## 2022-10-26 DIAGNOSIS — U071 COVID-19: Secondary | ICD-10-CM | POA: Diagnosis not present

## 2022-10-26 DIAGNOSIS — Z789 Other specified health status: Secondary | ICD-10-CM

## 2022-10-26 DIAGNOSIS — Z8673 Personal history of transient ischemic attack (TIA), and cerebral infarction without residual deficits: Secondary | ICD-10-CM

## 2022-10-26 DIAGNOSIS — E785 Hyperlipidemia, unspecified: Secondary | ICD-10-CM | POA: Diagnosis present

## 2022-10-26 DIAGNOSIS — Z87440 Personal history of urinary (tract) infections: Secondary | ICD-10-CM

## 2022-10-26 DIAGNOSIS — Z8674 Personal history of sudden cardiac arrest: Secondary | ICD-10-CM

## 2022-10-26 DIAGNOSIS — E878 Other disorders of electrolyte and fluid balance, not elsewhere classified: Secondary | ICD-10-CM | POA: Diagnosis present

## 2022-10-26 DIAGNOSIS — R680 Hypothermia, not associated with low environmental temperature: Secondary | ICD-10-CM | POA: Diagnosis present

## 2022-10-26 DIAGNOSIS — R54 Age-related physical debility: Secondary | ICD-10-CM | POA: Diagnosis present

## 2022-10-26 DIAGNOSIS — Z833 Family history of diabetes mellitus: Secondary | ICD-10-CM

## 2022-10-26 DIAGNOSIS — I2510 Atherosclerotic heart disease of native coronary artery without angina pectoris: Secondary | ICD-10-CM | POA: Diagnosis present

## 2022-10-26 DIAGNOSIS — Z7983 Long term (current) use of bisphosphonates: Secondary | ICD-10-CM

## 2022-10-26 DIAGNOSIS — E119 Type 2 diabetes mellitus without complications: Secondary | ICD-10-CM

## 2022-10-26 DIAGNOSIS — G9340 Encephalopathy, unspecified: Secondary | ICD-10-CM

## 2022-10-26 DIAGNOSIS — Z888 Allergy status to other drugs, medicaments and biological substances status: Secondary | ICD-10-CM

## 2022-10-26 DIAGNOSIS — Z8619 Personal history of other infectious and parasitic diseases: Secondary | ICD-10-CM

## 2022-10-26 DIAGNOSIS — I272 Pulmonary hypertension, unspecified: Secondary | ICD-10-CM | POA: Diagnosis present

## 2022-10-26 DIAGNOSIS — N2581 Secondary hyperparathyroidism of renal origin: Secondary | ICD-10-CM | POA: Diagnosis present

## 2022-10-26 DIAGNOSIS — E782 Mixed hyperlipidemia: Secondary | ICD-10-CM

## 2022-10-26 DIAGNOSIS — Z8701 Personal history of pneumonia (recurrent): Secondary | ICD-10-CM

## 2022-10-26 DIAGNOSIS — N184 Chronic kidney disease, stage 4 (severe): Secondary | ICD-10-CM | POA: Diagnosis present

## 2022-10-26 DIAGNOSIS — Z992 Dependence on renal dialysis: Secondary | ICD-10-CM

## 2022-10-26 DIAGNOSIS — M8580 Other specified disorders of bone density and structure, unspecified site: Secondary | ICD-10-CM | POA: Diagnosis present

## 2022-10-26 DIAGNOSIS — Z794 Long term (current) use of insulin: Secondary | ICD-10-CM

## 2022-10-26 DIAGNOSIS — Z9889 Other specified postprocedural states: Secondary | ICD-10-CM

## 2022-10-26 LAB — COMPREHENSIVE METABOLIC PANEL
ALT: 12 U/L (ref 0–44)
AST: 40 U/L (ref 15–41)
Albumin: 1.9 g/dL — ABNORMAL LOW (ref 3.5–5.0)
Alkaline Phosphatase: 190 U/L — ABNORMAL HIGH (ref 38–126)
Anion gap: 11 (ref 5–15)
BUN: 16 mg/dL (ref 8–23)
CO2: 27 mmol/L (ref 22–32)
Calcium: 8.5 mg/dL — ABNORMAL LOW (ref 8.9–10.3)
Chloride: 94 mmol/L — ABNORMAL LOW (ref 98–111)
Creatinine, Ser: 2.13 mg/dL — ABNORMAL HIGH (ref 0.44–1.00)
GFR, Estimated: 23 mL/min — ABNORMAL LOW (ref >60)
Glucose, Bld: 124 mg/dL — ABNORMAL HIGH (ref 70–99)
Potassium: 4 mmol/L (ref 3.5–5.1)
Sodium: 132 mmol/L — ABNORMAL LOW (ref 135–145)
Total Bilirubin: 0.5 mg/dL (ref 0.3–1.2)
Total Protein: 6.2 g/dL — ABNORMAL LOW (ref 6.5–8.1)

## 2022-10-26 LAB — CBC WITH DIFFERENTIAL/PLATELET
Abs Immature Granulocytes: 0.08 10*3/uL — ABNORMAL HIGH (ref 0.00–0.07)
Basophils Absolute: 0 10*3/uL (ref 0.0–0.1)
Basophils Relative: 0 %
Eosinophils Absolute: 0 10*3/uL (ref 0.0–0.5)
Eosinophils Relative: 0 %
HCT: 31.7 % — ABNORMAL LOW (ref 36.0–46.0)
Hemoglobin: 9.6 g/dL — ABNORMAL LOW (ref 12.0–15.0)
Immature Granulocytes: 1 %
Lymphocytes Relative: 10 %
Lymphs Abs: 1.2 10*3/uL (ref 0.7–4.0)
MCH: 28.7 pg (ref 26.0–34.0)
MCHC: 30.3 g/dL (ref 30.0–36.0)
MCV: 94.9 fL (ref 80.0–100.0)
Monocytes Absolute: 0.6 10*3/uL (ref 0.1–1.0)
Monocytes Relative: 5 %
Neutro Abs: 10.2 10*3/uL — ABNORMAL HIGH (ref 1.7–7.7)
Neutrophils Relative %: 84 %
Platelets: 319 10*3/uL (ref 150–400)
RBC: 3.34 MIL/uL — ABNORMAL LOW (ref 3.87–5.11)
RDW: 19.7 % — ABNORMAL HIGH (ref 11.5–15.5)
WBC: 12.1 10*3/uL — ABNORMAL HIGH (ref 4.0–10.5)
nRBC: 0 % (ref 0.0–0.2)

## 2022-10-26 LAB — RESP PANEL BY RT-PCR (RSV, FLU A&B, COVID)  RVPGX2
Influenza A by PCR: NEGATIVE
Influenza B by PCR: NEGATIVE
Resp Syncytial Virus by PCR: NEGATIVE
SARS Coronavirus 2 by RT PCR: POSITIVE — AB

## 2022-10-26 LAB — TROPONIN I (HIGH SENSITIVITY)
Troponin I (High Sensitivity): 11 ng/L (ref <18)
Troponin I (High Sensitivity): 10 ng/L (ref <18)

## 2022-10-26 LAB — GLUCOSE, CAPILLARY
Glucose-Capillary: 72 mg/dL (ref 70–99)
Glucose-Capillary: 134 mg/dL — ABNORMAL HIGH (ref 70–99)

## 2022-10-26 LAB — TSH: TSH: 10.745 u[IU]/mL — ABNORMAL HIGH (ref 0.350–4.500)

## 2022-10-26 LAB — MRSA NEXT GEN BY PCR, NASAL: MRSA by PCR Next Gen: NOT DETECTED

## 2022-10-26 LAB — BRAIN NATRIURETIC PEPTIDE: B Natriuretic Peptide: 1951.8 pg/mL — ABNORMAL HIGH (ref 0.0–100.0)

## 2022-10-26 LAB — LACTIC ACID, PLASMA
Lactic Acid, Venous: 0.7 mmol/L (ref 0.5–1.9)
Lactic Acid, Venous: 0.7 mmol/L (ref 0.5–1.9)

## 2022-10-26 LAB — CBG MONITORING, ED
Glucose-Capillary: 109 mg/dL — ABNORMAL HIGH (ref 70–99)
Glucose-Capillary: 105 mg/dL — ABNORMAL HIGH (ref 70–99)
Glucose-Capillary: 107 mg/dL — ABNORMAL HIGH (ref 70–99)
Glucose-Capillary: 102 mg/dL — ABNORMAL HIGH (ref 70–99)

## 2022-10-26 LAB — PROCALCITONIN: Procalcitonin: 2.05 ng/mL

## 2022-10-26 LAB — HEPATITIS B SURFACE ANTIGEN: Hepatitis B Surface Ag: NONREACTIVE

## 2022-10-26 LAB — T4, FREE: Free T4: 1.35 ng/dL — ABNORMAL HIGH (ref 0.61–1.12)

## 2022-10-26 MED ORDER — POLYETHYLENE GLYCOL 3350 17 G PO PACK
17.0000 g | PACK | Freq: Every day | ORAL | Status: DC
  Administered 2022-10-27 – 2022-10-30 (×3): 17 g via ORAL
  Filled 2022-10-26 (×4): qty 1

## 2022-10-26 MED ORDER — APIXABAN 2.5 MG PO TABS
2.5000 mg | ORAL_TABLET | Freq: Two times a day (BID) | ORAL | Status: DC
  Administered 2022-10-26 – 2022-10-30 (×10): 2.5 mg via ORAL
  Filled 2022-10-26 (×11): qty 1

## 2022-10-26 MED ORDER — ATORVASTATIN CALCIUM 40 MG PO TABS
40.0000 mg | ORAL_TABLET | Freq: Every day | ORAL | Status: DC
  Administered 2022-10-27 – 2022-10-30 (×5): 40 mg via ORAL
  Filled 2022-10-26 (×5): qty 1

## 2022-10-26 MED ORDER — MIDODRINE HCL 5 MG PO TABS
10.0000 mg | ORAL_TABLET | ORAL | Status: DC
  Administered 2022-10-28 – 2022-10-30 (×2): 10 mg via ORAL
  Filled 2022-10-26 (×2): qty 2

## 2022-10-26 MED ORDER — AMIODARONE HCL 200 MG PO TABS
200.0000 mg | ORAL_TABLET | Freq: Every day | ORAL | Status: DC
  Administered 2022-10-26 – 2022-10-30 (×5): 200 mg via ORAL
  Filled 2022-10-26 (×5): qty 1

## 2022-10-26 MED ORDER — SODIUM CHLORIDE 0.9% FLUSH
10.0000 mL | Freq: Two times a day (BID) | INTRAVENOUS | Status: DC
  Administered 2022-10-26 – 2022-10-29 (×6): 10 mL

## 2022-10-26 MED ORDER — ACETAMINOPHEN 325 MG PO TABS: 650.0000 mg | ORAL_TABLET | Freq: Four times a day (QID) | ORAL | Status: DC | PRN

## 2022-10-26 MED ORDER — SODIUM CHLORIDE 0.9 % IV SOLN
500.0000 mg | INTRAVENOUS | Status: DC
  Administered 2022-10-27 (×2): 500 mg via INTRAVENOUS
  Filled 2022-10-26 (×5): qty 10

## 2022-10-26 MED ORDER — INSULIN ASPART 100 UNIT/ML IJ SOLN
0.0000 [IU] | Freq: Three times a day (TID) | INTRAMUSCULAR | Status: DC
  Administered 2022-10-27 – 2022-10-28 (×2): 2 [IU] via SUBCUTANEOUS
  Administered 2022-10-28: 1 [IU] via SUBCUTANEOUS
  Administered 2022-10-29 (×2): 2 [IU] via SUBCUTANEOUS

## 2022-10-26 MED ORDER — PANTOPRAZOLE SODIUM 40 MG PO TBEC
40.0000 mg | DELAYED_RELEASE_TABLET | Freq: Every day | ORAL | Status: DC
  Administered 2022-10-26 – 2022-10-30 (×5): 40 mg via ORAL
  Filled 2022-10-26 (×5): qty 1

## 2022-10-26 MED ORDER — ALBUTEROL SULFATE (2.5 MG/3ML) 0.083% IN NEBU: 2.5000 mg | INHALATION_SOLUTION | Freq: Four times a day (QID) | RESPIRATORY_TRACT | Status: DC | PRN

## 2022-10-26 MED ORDER — VANCOMYCIN HCL 1500 MG/300ML IV SOLN
1500.0000 mg | Freq: Once | INTRAVENOUS | Status: AC
  Administered 2022-10-26: 1500 mg via INTRAVENOUS
  Filled 2022-10-26: qty 300

## 2022-10-26 MED ORDER — MIDODRINE HCL 5 MG PO TABS
5.0000 mg | ORAL_TABLET | Freq: Two times a day (BID) | ORAL | Status: DC
  Administered 2022-10-26 – 2022-10-30 (×9): 5 mg via ORAL
  Filled 2022-10-26 (×9): qty 1

## 2022-10-26 MED ORDER — EZETIMIBE 10 MG PO TABS
10.0000 mg | ORAL_TABLET | Freq: Every day | ORAL | Status: DC
  Administered 2022-10-26 – 2022-10-30 (×5): 10 mg via ORAL
  Filled 2022-10-26 (×5): qty 1

## 2022-10-26 MED ORDER — SODIUM CHLORIDE 0.9% FLUSH
3.0000 mL | Freq: Two times a day (BID) | INTRAVENOUS | Status: DC
  Administered 2022-10-26 – 2022-10-30 (×8): 3 mL via INTRAVENOUS

## 2022-10-26 MED ORDER — NYSTATIN 100000 UNIT/GM EX POWD
1.0000 | Freq: Two times a day (BID) | CUTANEOUS | Status: DC
  Administered 2022-10-26 – 2022-10-30 (×10): 1 via TOPICAL
  Filled 2022-10-26: qty 15

## 2022-10-26 MED ORDER — ACETAMINOPHEN 650 MG RE SUPP: 650.0000 mg | Freq: Four times a day (QID) | RECTAL | Status: DC | PRN

## 2022-10-26 MED ORDER — SODIUM CHLORIDE 0.9% FLUSH: 10.0000 mL | INTRAVENOUS | Status: DC | PRN

## 2022-10-26 MED ORDER — CHLORHEXIDINE GLUCONATE CLOTH 2 % EX PADS
6.0000 | MEDICATED_PAD | Freq: Every day | CUTANEOUS | Status: DC
  Administered 2022-10-26 – 2022-10-30 (×5): 6 via TOPICAL

## 2022-10-26 MED ORDER — GUAIFENESIN ER 600 MG PO TB12
600.0000 mg | ORAL_TABLET | Freq: Two times a day (BID) | ORAL | Status: DC
  Administered 2022-10-26 – 2022-10-30 (×10): 600 mg via ORAL
  Filled 2022-10-26 (×10): qty 1

## 2022-10-26 MED ORDER — VANCOMYCIN HCL 750 MG/150ML IV SOLN
750.0000 mg | INTRAVENOUS | Status: DC
  Administered 2022-10-26: 750 mg via INTRAVENOUS
  Filled 2022-10-26 (×2): qty 150

## 2022-10-26 MED ORDER — SODIUM CHLORIDE 0.9 % IV SOLN
1.0000 g | Freq: Once | INTRAVENOUS | Status: AC
  Administered 2022-10-26: 1 g via INTRAVENOUS
  Filled 2022-10-26: qty 20

## 2022-10-26 MED ORDER — SODIUM CHLORIDE 0.9 % IV SOLN
2.0000 g | Freq: Once | INTRAVENOUS | Status: AC
  Administered 2022-10-26: 2 g via INTRAVENOUS
  Filled 2022-10-26: qty 12.5

## 2022-10-26 NOTE — Consult Note (Signed)
NAME:  [name] [name], MRN:  [id number], DOB:  [birth year], LOS: 0 ADMISSION DATE:  10/26/2022, CONSULTATION DATE:  10/26/22 REFERRING MD:  EDP, CHIEF COMPLAINT:  ams/hypoglycemia/hypothermia   History of Present Illness:  82 yo female with PMH as below, which is signifnicant for ESRD on HD, HFrEF, DM, and hypothyroid who was recently discharged from [facility] after admission from 8/7-8/17 after cardiac arrest and esbl, klebsiella bacteremia (on abx still tdoay via PICC). She presented from [facility] after being found altered hypoxic and hypoglycemic 8/26. Reportedly she had no complaints the day before and was acting totally fine. Upon EMS arrival blood glucose was in the 50s treated effectively with sucrose. O2 sats in the 80's improved with 4L [place] oxygen.  Upon arrival to the ED she was found to have multiple metabolic derangements along with her chronic hypotension, volume overload. She was also lethargic. CCM was asked to evaluate for admission.   Pertinent  Medical History  HFrEF  Mod pulm htn Esrd on dialysis Hypothyroidism Chronic normocytic anemia t2dm  Significant Hospital Events: Including procedures, antibiotic start and stop dates in addition to other pertinent events     Interim History / Subjective:    Objective   Blood pressure (!) 148/72, pulse (!) 57, temperature (!) 91.5 F (33.1 C), temperature source Rectal, resp. rate 18, SpO2 100%.       No intake or output data in the 24 hours ending 10/26/22 0641 There were no vitals filed for this visit.  Examination: General: Overweight elderly female in NAD HENT: [place]/AT, PERRL, no JVD Lungs: Bibasilar crackles Cardiovascular: Borderline bradycardic, regular rhythm, no JVD Abdomen: Soft, NT, ND, hypoactive Extremities: No acute deformity or ROM limitation. Trace pre-tibial edema.  Neuro: Somnolent, but arouses to voice and is oriented x 3. Equal grip strength  Resolved Hospital Problem list     Assessment & Plan:    Bradycardia: likely related to hypothermia - Tele - Rates 60s now  Hypoglycemia- improved with oral treatment DM - Continue CBG testing.   Hyponatremia Hypochloremia Hypocalcemia - Nephrology to evaluate for HD  Hypotension: chronic - continue home midodrine  Hypothermia - Warming blanket  ESBL klebsiella bacteremia - diagnosed on prior admission. Remains on IV antibiotics.  - Continue outpatient antibiotics - Leukocytosis and hypothermia. Might make sense to repeat cultures. Defer to primary   Altered mental status 2/2 above -pt has known hypothyroidism, presenting with multiple derrangements.  -tsh is 10 but free t4 is wnl -cth thankfully negative -cont warming -replace electrolytes  H/o moderate pulm htn Acute hypoxic resp failure HFrEF, with acute exacerbation -last LVEF 25-30% 10/07/22 -pulm edema noted on cxr -BNP >1900 -titrate oxygen, currently only on 4L - HD  Esrd:  -nephrology coming to dialyze per EDP  Normocytic anemia - monitor  PCCM will sign off please re-consult if we can be of further assistance.   Best Practice (right click and "Reselect all SmartList Selections" daily)   Per primary  Labs   CBC: Recent Labs  Lab 10/26/22 0313  WBC 12.1*  NEUTROABS 10.2*  HGB 9.6*  HCT 31.7*  MCV 94.9  PLT 319    Basic Metabolic Panel: Recent Labs  Lab 10/26/22 0313  NA 132*  K 4.0  CL 94*  CO2 27  GLUCOSE 124*  BUN 16  CREATININE 2.13*  CALCIUM 8.5*   GFR: Estimated Creatinine Clearance: 22 mL/min (A) (by C-G formula based on SCr of 2.13 mg/dL (H)). Recent Labs  Lab 10/26/22 [phone] 10/26/22 [phone]  10/26/22 0605  WBC 12.1*  --   --   LATICACIDVEN  --  0.7 0.7    Liver Function Tests: Recent Labs  Lab 10/26/22 0313  AST 40  ALT 12  ALKPHOS 190*  BILITOT 0.5  PROT 6.2*  ALBUMIN 1.9*   No results for input(s): "LIPASE", "AMYLASE" in the last 168 hours. No results for input(s): "AMMONIA" in the last 168 hours.  ABG     Component Value Date/Time   PHART 7.532 (H) 05/26/2011 1511   PCO2ART 31.8 (L) 05/26/2011 1511   PO2ART 83.8 05/26/2011 1511   HCO3 26.5 07/20/2022 1101   TCO2 28 07/20/2022 1101   O2SAT 51 07/20/2022 1101     Coagulation Profile: No results for input(s): "INR", "PROTIME" in the last 168 hours.  Cardiac Enzymes: No results for input(s): "CKTOTAL", "CKMB", "CKMBINDEX", "TROPONINI" in the last 168 hours.  HbA1C: Hgb A1c MFr Bld  Date/Time Value Ref Range Status  09/06/2022 09:51 AM 9.7 (H) 4.8 - 5.6 % Final    Comment:    (NOTE)         Prediabetes: 5.7 - 6.4         Diabetes: >6.4         Glycemic control for adults with diabetes: <7.0   05/05/2022 12:31 PM 9.0 (H) 4.6 - 6.5 % Final    Comment:    Glycemic Control Guidelines for People with Diabetes:Non Diabetic:  <6%Goal of Therapy: <7%Additional Action Suggested:  >8%     CBG: Recent Labs  Lab 10/26/22 0305 10/26/22 0507 10/26/22 0610  GLUCAP 109* 105* 107*    Review of Systems:   Sob, tired  Past Medical History:  She,  has a past medical history of Atrial fibrillation (HCC), Blindness of left eye, CKD (chronic kidney disease), stage III (HCC) (06/16/2016), DIABETES MELLITUS, TYPE II (03/27/2006), Eye muscle weakness, GERD (gastroesophageal reflux disease) (10/05/2011), 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   Surgical History:   Past Surgical History:  Procedure Laterality Date   ANKLE FRACTURE SURGERY Bilateral 08/2015   "right one was in 2 places"   CARDIAC CATHETERIZATION N/A 05/17/2015   Procedure: Left Heart Cath and Coronary  Angiography;  Surgeon: Corky Crafts, MD;  Location[facility] INVASIVE CV LAB;  Service: Cardiovascular;  Laterality: N/A;   CARDIAC CATHETERIZATION N/A 05/17/2015   Procedure: Coronary Balloon Angioplasty;  Surgeon: Corky Crafts, MD;  Location: [facility] INVASIVE CV LAB;  Service: Cardiovascular;  Laterality: N/A;   CARDIOVERSION N/A 08/05/2022   Procedure: CARDIOVERSION;  Surgeon: Chilton Si, MD;  Location: [facility] INVASIVE CV LAB;  Service: Cardiovascular;  Laterality: N/A;   FEMUR IM NAIL Right 07/15/2012   Procedure: INTRAMEDULLARY (IM) NAIL HIP;  Surgeon: Eulas Post, MD;  Location: [facility] OR;  Service: Orthopedics;  Laterality: Right;   FEMUR IM NAIL Left 12/02/2015   Procedure: INTRAMEDULLARY (IM) NAIL FEMORAL;  Surgeon: Eldred Manges, MD;  Location: [facility] OR;  Service: Orthopedics;  Laterality: Left;   FRACTURE SURGERY     INCISION AND DRAINAGE HIP Left 01/03/2016   Procedure: IRRIGATION AND DEBRIDEMENT HIP;  Surgeon: Eldred Manges, MD;  Location: [facility] OR;  Service: Orthopedics;  Laterality: Left;   IR FLUORO GUIDE CV LINE RIGHT  09/14/2022  IR FLUORO GUIDE CV LINE RIGHT  09/18/2022   IR FLUORO GUIDE CV LINE RIGHT  10/16/2022   IR FLUORO GUIDE CV LINE RIGHT  10/16/2022   IR REMOVAL TUN CV CATH W/O FL  10/12/2022   IR US GUIDE VASC ACCESS RIGHT  09/14/2022   IR US GUIDE VASC ACCESS RIGHT  10/16/2022   IR US GUIDE VASC ACCESS RIGHT  10/16/2022   SHOULDER SURGERY Bilateral    "don't remember which side"     Social History:   reports that she quit smoking about 44 years ago. Her smoking use included cigarettes. She has never used smokeless tobacco. She reports that she does not drink alcohol and does not use drugs.   Family History:  Her family history includes Diabetes in her father and sister; Drug abuse in her son; Heart attack (age of onset: 83) in her father. There is no history of Cancer.   Allergies Allergies  Allergen Reactions   Procaine Hcl Anaphylaxis   Amoxicillin Itching     Home  Medications  Prior to Admission medications   Medication Sig Start Date End Date Taking? Authorizing Provider  amiodarone (PACERONE) 200 MG tablet Take 1 tablet (200 mg total) by mouth daily. 08/13/22  Yes Zannie Cove, MD  apixaban (ELIQUIS) 2.5 MG TABS tablet Take 1 tablet (2.5 mg total) by mouth 2 (two) times daily. To start likely on Friday 6/14 if recommended by Coumadin clinic after labs done 08/21/22  Yes Chilton Si, MD  atorvastatin (LIPITOR) 40 MG tablet Take 1 tablet (40 mg total) by mouth daily. Patient taking differently: Take 40 mg by mouth at bedtime. 09/24/22  Yes Burnadette Pop, MD  ertapenem [facility]) IVPB Inject 500 mg into the vein daily for 10 days. Indication:  ESBL Kleb PNA bacteremia First Dose: Yes Last Day of Therapy:  10/25/22 Labs - Once weekly:  CBC/D and BMP, Method of administration: Mini-Bag Plus / Gravity Method of administration may be changed at the discretion of home infusion pharmacist based upon assessment of the patient and/or caregiver's ability to self-administer the medication ordered. 10/17/22 10/27/22 Yes Charolotte Eke, MD  ezetimibe (ZETIA) 10 MG tablet Take 1 tablet (10 mg total) by mouth daily. 12/04/21  Yes Chilton Si, MD  insulin NPH-regular Human (70-30) 100 UNIT/ML injection Inject 5-10 Units into the skin See admin instructions. Give 10 units subcutaneously before breakfast and 5 units before dinner   Yes [provider]  midodrine (PROAMATINE) 10 MG tablet Take 1 tablet (10 mg total) by mouth every Monday, Wednesday, and Friday with hemodialysis. 10/19/22  Yes Charolotte Eke, MD  Multiple Vitamins-Minerals (MULTIVITAMIN ADULT PO) Take 1 tablet by mouth daily with breakfast.    Yes [provider]  nystatin (MYCOSTATIN/NYSTOP) powder Apply 1 Application topically 2 (two) times daily. 10/05/22  Yes [provider]  pantoprazole (PROTONIX) 40 MG tablet Take 1 tablet (40 mg total) by mouth daily. 09/24/22  Yes  Burnadette Pop, MD  polyethylene glycol (MIRALAX / GLYCOLAX) 17 g packet Take 17 g by mouth daily. 09/24/22  Yes Burnadette Pop, MD  sodium chloride 0.9 % infusion Inject into the vein. 10/17/22  Yes [provider]  VITAMIN D, CHOLECALCIFEROL, PO Take 1 tablet by mouth daily.   Yes [provider]  alendronate (FOSAMAX) 35 MG tablet Take 35 mg by mouth every 7 (seven) days. Take with a full glass of water on an empty stomach.    [provider]  midodrine (PROAMATINE) 5 MG  tablet Take 1 tablet (5 mg total) by mouth 3 (three) times daily. Patient taking differently: Take 5 mg by mouth 2 (two) times daily with a meal. Hold if SBP is more thann 120.Takes an adittional 5mg  on non dialysis days sun,tue,thur,sat. 10/17/22   Charolotte Eke, MD     Critical care time:      Joneen Roach, AGACNP-BC  Pulmonary & Critical Care  See Amion for personal pager PCCM on call pager [phone] until 7pm. Please call Elink 7p-7a. [phone]  10/26/2022 8:18 AM

## 2022-10-26 NOTE — H&P (Signed)
History and Physical    Patient: [name] [name]:[id number] DOB: 09-07-1940 DOA: 10/26/2022 DOS: the patient was seen and examined on 10/26/2022 PCP: [name], MD  Patient coming from: [facility] via EMS  Chief Complaint:  Chief Complaint  Patient presents with   Hypoglycemia   HPI: [name] is a 82 y.o. female with medical history significant of hypotension on midodrine, hyperlipidemia, paroxysmal atrial fibrillation on Eliquis, systolic CHF, CVA, ESRD on HD, diabetes mellitus type 2 who presents after being noted to be acutely altered at her facility.  She just recent been hospitalized 8/7-8/17, after having suspected cardiac arrest with echo showing EF of 25%.  Patient found to be bacteremic with ESBL Klebsiella.  ID has been consulted and recommended to complete 2 weeks ertapenem via PICC after HD catheter removed which should have completed on 8/25.    Review of EMS run sheet makes note that the patient had been having frequent glucose drops for which they have been monitoring her levels more frequently.  Patient's blood glucose was noted to have dropped into the 50s overnight with drops in her O2 saturations into the 80s.  She had been given glucagon and applied nasal cannula oxygen.  Blood glucose improved but her O2 saturations did not for which they called 911.  EMS found O2 saturations initially noted to be in the 84% on room air with improvement on 4 L of nasal cannula oxygen.    In the emergency department patient was noted to be hypothermic with temperature 91.4 F, pulse 55-64, blood pressures maintained, and O2 saturations currently maintained on 4 L of nasal cannula oxygen.  Labs significant for WBC- 12.1, hemoglobin- 9.6, sodium- 132, potassium- 4, BUN- 16, creatinine- 2.13, TSH- 10.745, free T4-1.35, BNP -1951.8, high-sensitivity troponin- 11->10, and lactic acid- 0.7.  Chest x-ray noted changes of vascular congestion without frank edema.  CT scan of the  head did not note any acute abnormality and aging brain with extensive left temporal encephalomalacia.  Blood cultures have been obtained.  Critical care have been consulted but recommended medicine admission.  Patient had been started on vancomycin, cefepime, and meropenem.   Review of Systems: As mentioned in the history of present illness. All other systems reviewed and are negative. 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   HYPERLIPIDEMIA 03/27/2006   HYPERTENSION 03/27/2006   Intertrochanteric fracture of right hip (HCC) 07/13/2012   LUNG NODULE 09/01/2006   Excision, Bx Benighn   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"   Osteopenia 2004   Dexa 2004 showed Osteopenia, DEXA 03/2007 normal   Osteopenia    Other chronic cystitis with hematuria    Pelvic fracture (HCC) 06/16/2016   S/P fall   Recurrent urinary tract infection    Seeing Urology   RETINOPATHY, BACKGROUND NOS 03/27/2006   Seizures (HCC)    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   Past Surgical History:  Procedure Laterality Date   ANKLE  FRACTURE SURGERY Bilateral 08/2015   "right one was in 2 places"   CARDIAC CATHETERIZATION N/A 05/17/2015   Procedure: Left Heart Cath and Coronary Angiography;  Surgeon: Corky Crafts, MD;  Location: [facility] INVASIVE CV LAB;  Service: Cardiovascular;  Laterality: N/A;   CARDIAC CATHETERIZATION N/A 05/17/2015   Procedure: Coronary Balloon Angioplasty;  Surgeon: Corky Crafts, MD;  Location: [facility] INVASIVE CV LAB;  Service:  Cardiovascular;  Laterality: N/A;   CARDIOVERSION N/A 08/05/2022   Procedure: CARDIOVERSION;  Surgeon: Chilton Si, MD;  Location: [facility] INVASIVE CV LAB;  Service: Cardiovascular;  Laterality: N/A;   FEMUR IM NAIL Right 07/15/2012   Procedure: INTRAMEDULLARY (IM) NAIL HIP;  Surgeon: Eulas Post, MD;  Location: [facility] OR;  Service: Orthopedics;  Laterality: Right;   FEMUR IM NAIL Left 12/02/2015   Procedure: INTRAMEDULLARY (IM) NAIL FEMORAL;  Surgeon: Eldred Manges, MD;  Location: [facility] OR;  Service: Orthopedics;  Laterality: Left;   FRACTURE SURGERY     INCISION AND DRAINAGE HIP Left 01/03/2016   Procedure: IRRIGATION AND DEBRIDEMENT HIP;  Surgeon: Eldred Manges, MD;  Location: [facility] OR;  Service: Orthopedics;  Laterality: Left;   IR FLUORO GUIDE CV LINE RIGHT  09/14/2022   IR FLUORO GUIDE CV LINE RIGHT  09/18/2022   IR FLUORO GUIDE CV LINE RIGHT  10/16/2022   IR FLUORO GUIDE CV LINE RIGHT  10/16/2022   IR REMOVAL TUN CV CATH W/O FL  10/12/2022   IR US GUIDE VASC ACCESS RIGHT  09/14/2022   IR US GUIDE VASC ACCESS RIGHT  10/16/2022   IR US GUIDE VASC ACCESS RIGHT  10/16/2022   SHOULDER SURGERY Bilateral    "don't remember which side"   Social History:  reports that she quit smoking about 44 years ago. Her smoking use included cigarettes. She has never used smokeless tobacco. She reports that she does not drink alcohol and does not use drugs.  Allergies  Allergen Reactions   Procaine Hcl Anaphylaxis   Amoxicillin Itching    Family History  Problem Relation Age of Onset   Diabetes Father    Heart attack Father 74   Diabetes Sister    Drug abuse Son    Cancer Neg Hx        no hx of colon or breast cancer    Prior to Admission medications   Medication Sig Start Date End Date Taking? Authorizing Provider  amiodarone (PACERONE) 200 MG tablet Take 1 tablet (200 mg total) by mouth daily. 08/13/22  Yes Zannie Cove, MD  apixaban (ELIQUIS) 2.5 MG TABS tablet Take 1 tablet (2.5 mg total) by mouth 2  (two) times daily. To start likely on Friday 6/14 if recommended by Coumadin clinic after labs done 08/21/22  Yes Chilton Si, MD  atorvastatin (LIPITOR) 40 MG tablet Take 1 tablet (40 mg total) by mouth daily. Patient taking differently: Take 40 mg by mouth at bedtime. 09/24/22  Yes Burnadette Pop, MD  ertapenem [facility]) IVPB Inject 500 mg into the vein daily for 10 days. Indication:  ESBL Kleb PNA bacteremia First Dose: Yes Last Day of Therapy:  10/25/22 Labs - Once weekly:  CBC/D and BMP, Method of administration: Mini-Bag Plus / Gravity Method of administration may be changed at the discretion of home infusion pharmacist based upon assessment of the patient and/or caregiver's ability to self-administer the medication ordered. 10/17/22 10/27/22 Yes Charolotte Eke, MD  ezetimibe (ZETIA) 10 MG tablet Take 1 tablet (10 mg total) by mouth daily. 12/04/21  Yes Chilton Si, MD  insulin NPH-regular Human (70-30) 100 UNIT/ML injection Inject 5-10 Units into the skin See admin instructions. Give 10 units subcutaneously before breakfast and 5 units before dinner   Yes [provider]  midodrine (PROAMATINE) 10 MG tablet Take 1 tablet (10 mg total) by mouth every Monday, Wednesday, and Friday with hemodialysis. 10/19/22  Yes Charolotte Eke, MD  Multiple Vitamins-Minerals (MULTIVITAMIN ADULT PO) Take 1 tablet by mouth daily with breakfast.    Yes [provider]  nystatin (MYCOSTATIN/NYSTOP) powder Apply 1 Application topically 2 (two) times daily. 10/05/22  Yes [provider]  pantoprazole (PROTONIX) 40 MG tablet Take 1 tablet (40 mg total) by mouth daily. 09/24/22  Yes Burnadette Pop, MD  polyethylene glycol (MIRALAX / GLYCOLAX) 17 g packet Take 17 g by mouth daily. 09/24/22  Yes Burnadette Pop, MD  sodium chloride 0.9 % infusion Inject into the vein. 10/17/22  Yes [provider]  VITAMIN D, CHOLECALCIFEROL, PO Take 1 tablet by mouth daily.   Yes [provider]  alendronate (FOSAMAX) 35 MG tablet Take 35 mg by mouth every 7 (seven) days. Take with a full glass of water on an empty stomach.    [provider]  midodrine (PROAMATINE) 5 MG tablet Take 1 tablet (5 mg total) by mouth 3 (three) times daily. Patient taking differently: Take 5 mg by mouth 2 (two) times daily with a meal. Hold if SBP is more thann 120.Takes an adittional 5mg  on non dialysis days sun,tue,thur,sat. 10/17/22   Charolotte Eke, MD    Physical Exam: Vitals:   10/26/22 0700 10/26/22 0715 10/26/22 0730 10/26/22 0800  BP: (!) 142/58 (!) 141/57 (!) 140/64 (!) 148/68  Pulse: (!) 58 (!) 56 (!) 57 62  Resp: 19 11 11 12   Temp:      TempSrc:      SpO2: 100% 100% 100% 100%   Constitutional: Elderly female who is lethargic, but able to respond and answer questions appropriately. Eyes: PERRL, lids and conjunctivae normal ENMT: Mucous membranes are moist.   Neck: normal, supple  Respiratory: clear to auscultation bilaterally, no wheezing, no crackles. Normal respiratory effort. No accessory muscle use.  Cardiovascular: Regular rate and rhythm, no murmurs / rubs / gallops.  +2 pitting lower extremity edema on left leg and at least +1 on the right leg.    Abdomen: no tenderness, no masses palpated.  Bowel sounds positive.  Musculoskeletal: no clubbing / cyanosis. No joint deformity upper and lower extremities. Good ROM, no contractures. Normal muscle tone.  Skin: no rashes, lesions, ulcers. No induration Neurologic: CN 2-12 grossly intact.  She is able to move all extremities. Psychiatric: Lethargic. Alert and oriented x 3. Normal mood.   Data Reviewed:  EKG reveals sinus rhythm at 61 bpm with first-degree heart block, prolonged QT of 592, and LBBB.  Assessment and Plan:  Diabetes mellitus type 2 with hypoglycemia, due to insulin Patient had reported to been having frequent drops in her blood glucose.  Insulin regimen included 70/30 insulin 10 units with  breakfast and 5 units with dinner.  Last hemoglobin A1c was 9.7 on 09/06/2022. -Admit to a progressive bed -Hypoglycemic protocols -Hold home 70/30 insulin regimen -CBGs before every meal with very sensitive SSI -Diabetes educator consulted, we will follow-up for any further recommendations  Acute respiratory failure with hypoxia secondary to fluid overload/ ESRD COVID-19 infection Acute.  Patient initially noted to have O2 saturations in the 80s with O2 saturations improved on  4 L nasal cannula oxygen.  Chest x-ray noted vascular congestion without frank edema.  BNP was elevated at 1951.8.  Nephrology has been consulted for need of hemodialysis.  COVID-19 screening was noted to be positive. -Continuous pulse oximetry with oxygen maintain O2 saturation greater than 92% -Add on procalcitonin -Continue supportive care at this time -Appreciate nephrology consultative services for management of hemodialysis  SIRS History of ESBL Klebsiella bacteremia Patient was noted to be hypothermic with initial white blood cell count elevated 12.1.  Blood cultures have been obtained.  She just recently treated for ESBL Klebsiella bacteremia.  Patient was initially started on empiric antibiotics of vancomycin, cefepime, and meropenem. -Follow-up blood cultures -Continue Bair hugger -Check influenza, COVID-19, and RSV screening -Check MRSA screen -Continue empiric antibiotics of vancomycin and meropenem    Acute metabolic encephalopathy Patient noted to be altered on admission.  CT scan of the head did not note any acute abnormality.  Thought possibly could be multifactorial in the setting of initial hypoglycemia and hypoxia, but also the concern for possible infection.  Other thoughts on differential include possibility of seizure versus TIA/stroke. -Continue neurochecks  Chronic hypotension Blood pressures currently maintained.  Patient on midodrine 5 mg twice daily and 10 mg on dialysis days. -Continue  midodrine  Systolic congestive heart failure Acute on chronic.  EF recently noted to be 25%.  Unable to tolerate guideline directed medical therapy at this time for heart failure due to hypotension. -Fluid management with HD  Hyponatremia Hypochloremia Secondary to the patient being fluid overloaded. -Continue to monitor  Paroxysmal atrial fibrillation on chronic anticoagulation Patient currently appears to be in a sinus rhythm. -Continue Eliquis  Anemia of chronic disease Hemoglobin 9.6 which appears near previous baseline. -Continue to monitor  Abnormal thyroid studies TSH- 10.745 and free T4-1.35.   -Patient recently started on amiodarone question discussing with cardiology versus rechecking thyroid studies in a few weeks.  Hyperlipidemia -Continue atorvastatin and Zetia  GERD -Continue Protonix  DVT prophylaxis: Eliquis  Advance Care Planning:   Code Status: DNR   Consults: PCCM, nephrology  Family Communication: Husband updated at bedside  Severity of Illness: The appropriate patient status for this patient is INPATIENT. Inpatient status is judged to be reasonable and necessary in order to provide the required intensity of service to ensure the patient's safety. The patient's presenting symptoms, physical exam findings, and initial radiographic and laboratory data in the context of their chronic comorbidities is felt to place them at high risk for further clinical deterioration. Furthermore, it is not anticipated that the patient will be medically stable for discharge from the hospital within 2 midnights of admission.   * I certify that at the point of admission it is my clinical judgment that the patient will require inpatient hospital care spanning beyond 2 midnights from the point of admission due to high intensity of service, high risk for further deterioration and high frequency of surveillance required.*  Author: Clydie Braun, MD 10/26/2022 8:34 AM  For on  call review [email or web address].

## 2022-10-26 NOTE — ED Provider Notes (Signed)
[place] EMERGENCY DEPARTMENT AT [facility] Provider Note   CSN: [id number] Arrival date & time: 10/26/22  0240     History  Chief Complaint  Patient presents with   Hypoglycemia    TYLYNN CALCUTT is a 82 y.o. female.  Patient brought in from her nursing facility by EMS secondary to multiple abnormalities.  Apparently patient's mental status change sometime tonight.  They checked her blood sugar and was low so they gave her sucrose and improved however she was hypoxic as well.  Persistently confused so they called EMS.  Looks like her blood sugars got into the 50s at the facility and her oxygen was in the 80s.  On 4 L of oxygen is okay and her CBG was within normal limits with EMS.  Patient is able to awake and answer direct questions and is oriented to person place and time but not really why she is here.  Her husband showed up shortly after arriving and states that he saw her yesterday and she was feeling well.  I reviewed the records it appears the patient was recently admitted after cardiac arrest secondary to Klebsiella bacteremia.  She has a PICC line is post to be on ertapenem until August 28.  She is post get a dialysis.  She is been compliant with her dialysis as far as everybody knows, she is on Monday Wednesday Friday dialysis patient.  No other known changes. Only endorses SOB at this time.    Hypoglycemia      Home Medications Prior to Admission medications   Medication Sig Start Date End Date Taking? Authorizing Provider  amiodarone (PACERONE) 200 MG tablet Take 1 tablet (200 mg total) by mouth daily. 08/13/22  Yes Zannie Cove, MD  apixaban (ELIQUIS) 2.5 MG TABS tablet Take 1 tablet (2.5 mg total) by mouth 2 (two) times daily. To start likely on Friday 6/14 if recommended by Coumadin clinic after labs done 08/21/22  Yes Chilton Si, MD  atorvastatin (LIPITOR) 40 MG tablet Take 1 tablet (40 mg total) by mouth daily. Patient taking differently: Take  40 mg by mouth at bedtime. 09/24/22  Yes Burnadette Pop, MD  ertapenem [facility]) IVPB Inject 500 mg into the vein daily for 10 days. Indication:  ESBL Kleb PNA bacteremia First Dose: Yes Last Day of Therapy:  10/25/22 Labs - Once weekly:  CBC/D and BMP, Method of administration: Mini-Bag Plus / Gravity Method of administration may be changed at the discretion of home infusion pharmacist based upon assessment of the patient and/or caregiver's ability to self-administer the medication ordered. 10/17/22 10/27/22 Yes Charolotte Eke, MD  ezetimibe (ZETIA) 10 MG tablet Take 1 tablet (10 mg total) by mouth daily. 12/04/21  Yes Chilton Si, MD  insulin NPH-regular Human (70-30) 100 UNIT/ML injection Inject 5-10 Units into the skin See admin instructions. Give 10 units subcutaneously before breakfast and 5 units before dinner   Yes [provider]  midodrine (PROAMATINE) 10 MG tablet Take 1 tablet (10 mg total) by mouth every Monday, Wednesday, and Friday with hemodialysis. 10/19/22  Yes Charolotte Eke, MD  Multiple Vitamins-Minerals (MULTIVITAMIN ADULT PO) Take 1 tablet by mouth daily with breakfast.    Yes [provider]  nystatin (MYCOSTATIN/NYSTOP) powder Apply 1 Application topically 2 (two) times daily. 10/05/22  Yes [provider]  pantoprazole (PROTONIX) 40 MG tablet Take 1 tablet (40 mg total) by mouth daily. 09/24/22  Yes Burnadette Pop, MD  polyethylene glycol (MIRALAX / GLYCOLAX) 17 g  packet Take 17 g by mouth daily. 09/24/22  Yes Burnadette Pop, MD  sodium chloride 0.9 % infusion Inject into the vein. 10/17/22  Yes [provider]  VITAMIN D, CHOLECALCIFEROL, PO Take 1 tablet by mouth daily.   Yes [provider]  alendronate (FOSAMAX) 35 MG tablet Take 35 mg by mouth every 7 (seven) days. Take with a full glass of water on an empty stomach.    [provider]  midodrine (PROAMATINE) 5 MG tablet Take 1 tablet (5 mg total) by mouth 3 (three)  times daily. Patient taking differently: Take 5 mg by mouth 2 (two) times daily with a meal. Hold if SBP is more thann 120.Takes an adittional 5mg  on non dialysis days sun,tue,thur,sat. 10/17/22   Charolotte Eke, MD      Allergies    Procaine hcl and Amoxicillin    Review of Systems   Review of Systems  Physical Exam Updated Vital Signs BP (!) 147/68   Pulse (!) 59   Temp (!) 91.5 F (33.1 C) (Rectal)   Resp 19   SpO2 100%  Physical Exam Vitals and nursing note reviewed.  Constitutional:      Appearance: She is well-developed.  HENT:     Head: Normocephalic and atraumatic.     Mouth/Throat:     Mouth: Mucous membranes are dry.  Cardiovascular:     Rate and Rhythm: Regular rhythm. Bradycardia present.  Pulmonary:     Effort: No respiratory distress.     Breath sounds: No stridor.  Abdominal:     General: There is no distension.  Musculoskeletal:     Cervical back: Normal range of motion.  Skin:    Comments: Cool, pale  Neurological:     Mental Status: She is alert and oriented to person, place, and time.     Comments: Lethargic, answers questions and follows commands     ED Results / Procedures / Treatments   Labs (all labs ordered are listed, but only abnormal results are displayed) Labs Reviewed  BRAIN NATRIURETIC PEPTIDE - Abnormal; Notable for the following components:      Result Value   B Natriuretic Peptide 1,951.8 (*)    All other components within normal limits  COMPREHENSIVE METABOLIC PANEL - Abnormal; Notable for the following components:   Sodium 132 (*)    Chloride 94 (*)    Glucose, Bld 124 (*)    Creatinine, Ser 2.13 (*)    Calcium 8.5 (*)    Total Protein 6.2 (*)    Albumin 1.9 (*)    Alkaline Phosphatase 190 (*)    GFR, Estimated 23 (*)    All other components within normal limits  CBC WITH DIFFERENTIAL/PLATELET - Abnormal; Notable for the following components:   WBC 12.1 (*)    RBC 3.34 (*)    Hemoglobin 9.6 (*)    HCT 31.7 (*)    RDW  19.7 (*)    Neutro Abs 10.2 (*)    Abs Immature Granulocytes 0.08 (*)    All other components within normal limits  T4, FREE - Abnormal; Notable for the following components:   Free T4 1.35 (*)    All other components within normal limits  TSH - Abnormal; Notable for the following components:   TSH 10.745 (*)    All other components within normal limits  CBG MONITORING, ED - Abnormal; Notable for the following components:   Glucose-Capillary 109 (*)    All other components within normal limits  CBG MONITORING, ED - Abnormal; Notable for the following components:   Glucose-Capillary 105 (*)    All other components within normal limits  CBG MONITORING, ED - Abnormal; Notable for the following components:   Glucose-Capillary 107 (*)    All other components within normal limits  CULTURE, BLOOD (ROUTINE X 2)  CULTURE, BLOOD (ROUTINE X 2)  LACTIC ACID, PLASMA  URINALYSIS, ROUTINE W REFLEX MICROSCOPIC  LACTIC ACID, PLASMA  TROPONIN I (HIGH SENSITIVITY)  TROPONIN I (HIGH SENSITIVITY)    EKG None  Radiology CT Head Wo Contrast  Result Date: 10/26/2022 CLINICAL DATA:  Mental status change with unknown cause EXAM: CT HEAD WITHOUT CONTRAST TECHNIQUE: Contiguous axial images were obtained from the base of the skull through the vertex without intravenous contrast. RADIATION DOSE REDUCTION: This exam was performed according to the departmental dose-optimization program which includes automated exposure control, adjustment of the mA and/or kV according to patient size and/or use of iterative reconstruction technique. 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 Low-density in the cerebral white matter attributed to chronic small vessel ischemia. There is generalized brain atrophy. Vascular: No  hyperdense vessel. Skull: Normal. Negative for fracture or focal lesion. Sinuses/Orbits: No acute finding. IMPRESSION: 1. No acute finding. 2. Aging brain and extensive left temporal encephalomalacia. Electronically Signed   By: Tiburcio Pea M.D.   On: 10/26/2022 06:02   DG Chest Portable 1 View  Result Date: 10/26/2022 CLINICAL DATA:  Difficulty breathing EXAM: PORTABLE CHEST 1 VIEW COMPARISON:  10/11/2022 FINDINGS: Cardiac shadow is enlarged but stable. Aortic calcifications are seen. Dialysis catheter is again noted. Increased vascular congestion is noted when compare with the prior exam. No significant edema is noted. No acute bony abnormality is seen. Old rib fractures are noted bilaterally. Tunneled central venous line is noted with the catheter tip at the cavoatrial junction. IMPRESSION: Changes of vascular congestion without significant edema. Electronically Signed   By: Alcide Clever M.D.   On: 10/26/2022 03:58    Procedures .Critical Care  Performed by: Marily Memos, MD Authorized by: Marily Memos, MD   Critical care provider statement:    Critical care time (minutes):  32   Critical care time was exclusive of:  Separately billable procedures and treating other patients and teaching time   Critical care was necessary to treat or prevent imminent or life-threatening deterioration of the following conditions:  Respiratory failure, endocrine crisis, dehydration, CNS failure or compromise and sepsis   Critical care was time spent personally by me on the following activities:  Development of treatment plan with patient or surrogate, evaluation of patient's response to treatment, examination of patient, obtaining history from patient or surrogate, review of old charts, re-evaluation of patient's condition, pulse oximetry, ordering and performing treatments and interventions and ordering and review of laboratory studies     Medications Ordered in ED Medications  vancomycin (VANCOREADY)  IVPB 1500 mg/300 mL (1,500 mg Intravenous New Bag/Given 10/26/22 0450)  meropenem (MERREM) 1 g in sodium chloride 0.9 % 100 mL IVPB (has no administration in time range)  ceFEPIme (MAXIPIME) 2 g in sodium chloride 0.9 % 100 mL IVPB (0 g Intravenous Stopped 10/26/22 0518)    ED Course/ Medical Decision Making/ A&P  Medical Decision Making Amount and/or Complexity of Data Reviewed Labs: ordered. Radiology: ordered.  Risk Prescription drug management.   82 year old here with borderline bradycardia but normal blood pressures.  She is hypothermic to 91, hypoglycemic at the facility.  Hypoxic.  Initially did not realize she had been admitted for ESBL Klebsiella bacteremia recently so was just started on normal broad-spectrum antibiotics after blood cultures were obtained.  She does have pulmonary edema and is due for dialysis today so I suspect this with hypoxia is coming from or could be artifact related to hypothermia.  She is protecting her airway at this time but is very lethargic.  Discussed all of her findings with Dr. Malachi Bonds with nephrology and they will plan for dialysis later today.  Secondary to her persistent hypothermia even after being on a Bair hugger previous cardiac arrest related to her Klebsiella bacteremia will discuss with critical care for admission.  Discussed with Dr. Gaynell Face for admission, they will evaluate and get back to [place].  Final Clinical Impression(s) / ED Diagnoses Final diagnoses:  Hypothermia, initial encounter  Confusion  Somnolence  Hypoglycemia    Rx / DC Orders ED Discharge Orders     None         Athaliah Baumbach, Barbara Cower, MD 10/26/22 [phone]

## 2022-10-26 NOTE — Consult Note (Addendum)
[facility] Renal Consultation Note    Indication for Consultation:  Management of ESRD/hemodialysis, anemia, hypertension/volume, and secondary hyperparathyroidism. PCP:  HPI: [name] [name] is a 82 y.o. female with ?AKI v. ESRD, HTN, HL, T2DM, CAD, A-fib on Eliquis who was admitted with AMS.  Evaluated in ED - at that time, she was not really responsive. Husband was at bedside and provided history. Was in her usual state of (frail) health yesterday. He was called by SNF this AM that she was being transported to ED for non-responsiveness. He denies any new complaints over the past few days. No new medications at he is aware of. In the ED, she was extremely hypothermic - T91.37F - and she was hypoxic. Sepsis protocol activated. She was placed under a warming blanket, pan-cultured. Labs showed Na 132, K 4, CO2 27, BUN 16, Cr 2.13, Alb 1.9, Trop 11 -> 10, WBC 12.1, Hgb 9.6, Plts 319. Flu negative, but COVID POSITIVE. Head CT without acute CVA. CXR with ^ vascular congestion.  Has been on dialysis since 08/2022 - considered "AKI" but her outpatient unit. Baseline Cr was 2's - up to 4.5 range with uremia and overload, started HD at that time. Reviewing outpatient records - losing weight and dry weight is being lowered, ?assuming doesn't make much urine. Her muscle mass is very low so Cr can be deceivingly low. Should have finished a course of Ertapenem for Klebsiella bacteremia yesterday.   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    HYPERLIPIDEMIA 03/27/2006   HYPERTENSION 03/27/2006   Intertrochanteric fracture of right hip (HCC) 07/13/2012   LUNG NODULE 09/01/2006   Excision, Bx Benighn   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"   Osteopenia 2004   Dexa 2004 showed Osteopenia, DEXA 03/2007 normal   Osteopenia    Other chronic cystitis with hematuria    Pelvic fracture (HCC) 06/16/2016   S/P fall   Recurrent urinary tract infection    Seeing Urology   RETINOPATHY, BACKGROUND NOS 03/27/2006   Seizures (HCC)    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   Past Surgical History:  Procedure Laterality Date   ANKLE FRACTURE SURGERY Bilateral 08/2015   "right one was in 2 places"   CARDIAC CATHETERIZATION N/A 05/17/2015   Procedure: Left Heart Cath and Coronary Angiography;  Surgeon: Corky Crafts, MD;  Location: [facility] INVASIVE CV LAB;  Service: Cardiovascular;  Laterality: N/A;   CARDIAC CATHETERIZATION N/A 05/17/2015   Procedure: Coronary Balloon Angioplasty;  Surgeon: Corky Crafts, MD;  Location: [facility] INVASIVE CV LAB;  Service: Cardiovascular;  Laterality: N/A;   CARDIOVERSION N/A 08/05/2022   Procedure: CARDIOVERSION;  Surgeon: Chilton Si, MD;  Location: [facility] INVASIVE CV LAB;  Service: Cardiovascular;  Laterality: N/A;   FEMUR IM NAIL Right 07/15/2012   Procedure: INTRAMEDULLARY (IM) NAIL HIP;  Surgeon: Eulas Post, MD;  Location: [facility] OR;  Service: Orthopedics;  Laterality: Right;   FEMUR IM NAIL Left 12/02/2015   Procedure:  INTRAMEDULLARY (IM) NAIL FEMORAL;  Surgeon: Eldred Manges, MD;  Location: [facility] OR;  Service: Orthopedics;  Laterality: Left;   FRACTURE SURGERY     INCISION AND DRAINAGE HIP Left 01/03/2016   Procedure: IRRIGATION AND DEBRIDEMENT HIP;  Surgeon: Eldred Manges, MD;  Location: [facility] OR;  Service: Orthopedics;  Laterality: Left;   IR FLUORO GUIDE CV LINE RIGHT  09/14/2022   IR FLUORO GUIDE CV LINE RIGHT  09/18/2022   IR FLUORO GUIDE CV LINE RIGHT  10/16/2022   IR FLUORO GUIDE CV LINE RIGHT  10/16/2022   IR  REMOVAL TUN CV CATH W/O FL  10/12/2022   IR US GUIDE VASC ACCESS RIGHT  09/14/2022   IR US GUIDE VASC ACCESS RIGHT  10/16/2022   IR US GUIDE VASC ACCESS RIGHT  10/16/2022   SHOULDER SURGERY Bilateral    "don't remember which side"   Family History  Problem Relation Age of Onset   Diabetes Father    Heart attack Father 52   Diabetes Sister    Drug abuse Son    Cancer Neg Hx        no hx of colon or breast cancer   Social History:  reports that she quit smoking about 44 years ago. Her smoking use included cigarettes. She has never used smokeless tobacco. She reports that she does not drink alcohol and does not use drugs.  ROS: Unable to obtain - AMS.  Physical Exam: Vitals:   10/26/22 0900 10/26/22 1000 10/26/22 1104 10/26/22 1109  BP: (!) 143/61 (!) 144/60  (!) 146/63  Pulse: 62 65  69  Resp: 18 15  16   Temp:  (!) 97.3 F (36.3 C) 97.6 F (36.4 C) 97.6 F (36.4 C)  TempSrc:  Axillary Oral Oral  SpO2: 100% 100%  100%  Weight:   77.8 kg   Height:   5' 9.02" (1.753 m)      General: Frail, ill appearing woman, NAD. Not responding much. Nasal O2 in place. Head: Normocephalic, atraumatic Neck: Supple without lymphadenopathy/masses.  Lungs: Clear anteriorly Heart: RRR with normal S1, S2. 2/6 murmur Abdomen: Soft, non-tender, non-distended with normoactive bowel sounds.  Lower extremities: 4+ BLE edema Neuro: Does not respond to my questions. Dialysis Access: TDC in R chest  Allergies  Allergen Reactions   Procaine Hcl Anaphylaxis   Amoxicillin Itching   Prior to Admission medications   Medication Sig Start Date End Date Taking? Authorizing Provider  amiodarone (PACERONE) 200 MG tablet Take 1 tablet (200 mg total) by mouth daily. 08/13/22  Yes Zannie Cove, MD  apixaban (ELIQUIS) 2.5 MG TABS tablet Take 1 tablet (2.5 mg total) by mouth 2 (two) times daily. To start likely on Friday 6/14 if recommended by Coumadin clinic after labs done 08/21/22  Yes Chilton Si, MD   atorvastatin (LIPITOR) 40 MG tablet Take 1 tablet (40 mg total) by mouth daily. Patient taking differently: Take 40 mg by mouth at bedtime. 09/24/22  Yes Burnadette Pop, MD  ertapenem [facility]) IVPB Inject 500 mg into the vein daily for 10 days. Indication:  ESBL Kleb PNA bacteremia First Dose: Yes Last Day of Therapy:  10/25/22 Labs - Once weekly:  CBC/D and BMP, Method of administration: Mini-Bag Plus / Gravity Method of administration may be changed at the discretion of home infusion pharmacist based upon assessment of the patient and/or caregiver's ability to self-administer the medication ordered. 10/17/22 10/27/22 Yes Charolotte Eke, MD  ezetimibe (ZETIA) 10 MG tablet Take 1 tablet (10  mg total) by mouth daily. 12/04/21  Yes Chilton Si, MD  insulin NPH-regular Human (70-30) 100 UNIT/ML injection Inject 5-10 Units into the skin See admin instructions. Give 10 units subcutaneously before breakfast and 5 units before dinner   Yes [provider]  midodrine (PROAMATINE) 10 MG tablet Take 1 tablet (10 mg total) by mouth every Monday, Wednesday, and Friday with hemodialysis. 10/19/22  Yes Charolotte Eke, MD  Multiple Vitamins-Minerals (MULTIVITAMIN ADULT PO) Take 1 tablet by mouth daily with breakfast.    Yes [provider]  nystatin (MYCOSTATIN/NYSTOP) powder Apply 1 Application topically 2 (two) times daily. 10/05/22  Yes [provider]  pantoprazole (PROTONIX) 40 MG tablet Take 1 tablet (40 mg total) by mouth daily. 09/24/22  Yes Burnadette Pop, MD  polyethylene glycol (MIRALAX / GLYCOLAX) 17 g packet Take 17 g by mouth daily. 09/24/22  Yes Burnadette Pop, MD  sodium chloride 0.9 % infusion Inject into the vein. 10/17/22  Yes [provider]  VITAMIN D, CHOLECALCIFEROL, PO Take 1 tablet by mouth daily.   Yes [provider]  alendronate (FOSAMAX) 35 MG tablet Take 35 mg by mouth every 7 (seven) days. Take with a full glass of water on an empty  stomach.    [provider]  midodrine (PROAMATINE) 5 MG tablet Take 1 tablet (5 mg total) by mouth 3 (three) times daily. Patient taking differently: Take 5 mg by mouth 2 (two) times daily with a meal. Hold if SBP is more thann 120.Takes an adittional 5mg  on non dialysis days sun,tue,thur,sat. 10/17/22   Charolotte Eke, MD   Current Facility-Administered Medications  Medication Dose Route Frequency Provider Last Rate Last Admin   acetaminophen (TYLENOL) tablet 650 mg  650 mg Oral Q6H PRN Clydie Braun, MD       Or   acetaminophen (TYLENOL) suppository 650 mg  650 mg Rectal Q6H PRN Madelyn Flavors A, MD       albuterol (PROVENTIL) (2.5 MG/3ML) 0.083% nebulizer solution 2.5 mg  2.5 mg Nebulization Q6H PRN Clydie Braun, MD       amiodarone (PACERONE) tablet 200 mg  200 mg Oral Daily Katrinka Blazing, Rondell A, MD   200 mg at 10/26/22 1201   apixaban (ELIQUIS) tablet 2.5 mg  2.5 mg Oral BID Madelyn Flavors A, MD   2.5 mg at 10/26/22 1209   atorvastatin (LIPITOR) tablet 40 mg  40 mg Oral QHS Smith, Rondell A, MD       Chlorhexidine Gluconate Cloth 2 % PADS 6 each  6 each Topical Daily Estanislado Emms, MD       ezetimibe (ZETIA) tablet 10 mg  10 mg Oral Daily Katrinka Blazing, Rondell A, MD   10 mg at 10/26/22 1201   guaiFENesin (MUCINEX) 12 hr tablet 600 mg  600 mg Oral BID Smith, Rondell A, MD       insulin aspart (novoLOG) injection 0-6 Units  0-6 Units Subcutaneous TID WC Smith, Rondell A, MD       meropenem (MERREM) 500 mg in sodium chloride 0.9 % 100 mL IVPB  500 mg Intravenous Q24H Rumbarger, Rachel L, RPH       midodrine (PROAMATINE) tablet 10 mg  10 mg Oral Q M,W,F-HD Smith, Rondell A, MD       midodrine (PROAMATINE) tablet 5 mg  5 mg Oral BID WC Smith, Rondell A, MD       nystatin (MYCOSTATIN/NYSTOP) topical powder 1 Application  1 Application Topical BID Katrinka Blazing, Rondell  A, MD   1 Application at 10/26/22 1219   pantoprazole (PROTONIX) EC tablet 40 mg  40 mg Oral Daily Katrinka Blazing, Rondell A, MD   40 mg at  10/26/22 1201   polyethylene glycol (MIRALAX / GLYCOLAX) packet 17 g  17 g Oral Daily Smith, Rondell A, MD       sodium chloride flush (NS) 0.9 % injection 10-40 mL  10-40 mL Intracatheter Q12H Estanislado Emms, MD       sodium chloride flush (NS) 0.9 % injection 10-40 mL  10-40 mL Intracatheter PRN Estanislado Emms, MD       sodium chloride flush (NS) 0.9 % injection 3 mL  3 mL Intravenous Q12H Smith, Rondell A, MD   3 mL at 10/26/22 1210   vancomycin (VANCOREADY) IVPB 750 mg/150 mL  750 mg Intravenous Q M,W,F-HD Rumbarger, Faye Ramsay, RPH       Labs: Basic Metabolic Panel: Recent Labs  Lab 10/26/22 0313  NA 132*  K 4.0  CL 94*  CO2 27  GLUCOSE 124*  BUN 16  CREATININE 2.13*  CALCIUM 8.5*   Liver Function Tests: Recent Labs  Lab 10/26/22 0313  AST 40  ALT 12  ALKPHOS 190*  BILITOT 0.5  PROT 6.2*  ALBUMIN 1.9*   CBC: Recent Labs  Lab 10/26/22 0313  WBC 12.1*  NEUTROABS 10.2*  HGB 9.6*  HCT 31.7*  MCV 94.9  PLT 319   Studies/Results: CT Head Wo Contrast  Result Date: 10/26/2022 CLINICAL DATA:  Mental status change with unknown cause EXAM: CT HEAD WITHOUT CONTRAST TECHNIQUE: Contiguous axial images were obtained from the base of the skull through the vertex without intravenous contrast. RADIATION DOSE REDUCTION: This exam was performed according to the departmental dose-optimization program which includes automated exposure control, adjustment of the mA and/or kV according to patient size and/or use of iterative reconstruction technique. 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 Low-density in the cerebral white matter attributed to chronic small vessel ischemia. There is generalized brain atrophy. Vascular: No hyperdense vessel. Skull: Normal. Negative for fracture or focal  lesion. Sinuses/Orbits: No acute finding. IMPRESSION: 1. No acute finding. 2. Aging brain and extensive left temporal encephalomalacia. Electronically Signed   By: Tiburcio Pea M.D.   On: 10/26/2022 06:02   DG Chest Portable 1 View  Result Date: 10/26/2022 CLINICAL DATA:  Difficulty breathing EXAM: PORTABLE CHEST 1 VIEW COMPARISON:  10/11/2022 FINDINGS: Cardiac shadow is enlarged but stable. Aortic calcifications are seen. Dialysis catheter is again noted. Increased vascular congestion is noted when compare with the prior exam. No significant edema is noted. No acute bony abnormality is seen. Old rib fractures are noted bilaterally. Tunneled central venous line is noted with the catheter tip at the cavoatrial junction. IMPRESSION: Changes of vascular congestion without significant edema. Electronically Signed   By: Alcide Clever M.D.   On: 10/26/2022 03:58    Dialysis Orders:  MWF at [facility] 3:30hr, 400/600, EDW 74.1kg, 3K/2.5Ca, TDC, no heparin - Last HD 8/23, left at 73.6kg - Mircera IV q 2 weeks - last given 7/29  Assessment/Plan:  AMS: Full work-up pending, found to be COVID positive. Blood Cx 8/26 pending. On Vanc/Mero. Brain MRI pending.  ESRD v. AKI:  Started HD 08/2022 - Cr is deceivingly low due to lack of muscle mass. Lots of edema  on exam - will plan for HD tonight if our census allows (if not, then tomorrow). Need to verify if making urine - will give Lasix if so.  Hypertension/volume: BP up - UF as tolerated.  Anemia: Hgb 9.6 - due for ESA, will order.  Metabolic bone disease: CorrCa up slightly - check Phos.   Nutrition:  Alb very low - will add supplements when awake enough to eat. T2DM CAD A-fib  [name] Hoyle, PA-C 10/26/2022, 1:28 PM  [facility]   Seen and examined independently.  Agree with note and exam as documented above by physician extender and as noted here.  Patient with dialysis dependent AKI versus ESRD who presented to the hospital from her  SNF after being found less responsive.  She was found to be COVID-positive here.  She had blood cultures sent and was given vanc, cefepime, and meropenem today.  Note that she started dialysis in July 2024.  Nurse is at bedside completing her intake and states that the patient has been confused on and off here.  She was reported as being lethargic on arrival and this is improved.  Note that she was recently discharged after an inpatient stay after a cardiac arrest at her outpatient HD unit (with ROSC in 8 minutes per charting).  Cardiology was consulted at that time.     General adult female in bed in no acute distress HEENT normocephalic atraumatic extraocular movements intact sclera anicteric Neck supple trachea midline Lungs clear to auscultation bilaterally normal work of breathing at rest  Heart S1S2 no rub Abdomen soft nontender nondistended Extremities 2+ edema  Psych normal mood and affect Access RIJ tunn catheter in place  # AMS/encephalopathy  - may be secondary to covid   - improving per charting   # Covid 19 pna - therapies per primary team   # Dialysis-dependent AKI vs ESRD  - HD today if census allows as above  # HTN  - optimize volume with HD as able   # Anemia of CKD  - as above, ESA is due   # Metabolic bone disease  - can continue outpatient regimen   # Atrial fibrillation  - rate controlling agents per primary team   Thank you for the consult.  Please do not hesitate to contact me with any questions regarding our patient     Estanislado Emms, MD 10/26/2022  2:15 PM

## 2022-10-26 NOTE — Progress Notes (Signed)
ED Pharmacy Antibiotic Sign Off An antibiotic consult was received from an ED provider for Vancomycin and Cefepime  per pharmacy dosing for sepsis. A chart review was completed to assess appropriateness.   The following one time order(s) were placed:  Vancomycin 1500 mg IV Cefepime 2 g IV  Further antibiotic and/or antibiotic pharmacy consults should be ordered by the admitting provider if indicated.   Thank you for allowing pharmacy to be a part of this patient's care.   Eddie Candle, [facility]  Clinical Pharmacist 10/26/22 6:06 AM

## 2022-10-26 NOTE — ED Notes (Signed)
ED TO INPATIENT HANDOFF REPORT  ED Nurse Name and Phone #: Nehemiah Settle [phone]  S Name/Age/Gender [name] [name] 82 y.o. female Room/Bed: 037C/037C  Code Status   Code Status: Prior  Home/SNF/Other Home Patient oriented to: self, place, and situation Is this baseline? Yes   Triage Complete: Triage complete  Chief Complaint ams  Triage Note Pt was checked on by [facility] staff and she was found hypoxic and hypoglycemic. She has hx of some confusion and fluid overload. She seems a bit "off" compared to them.   CBG 127 HR 60s 96% O2 4L  Has PICC line and port for dialysis.    Allergies Allergies  Allergen Reactions   Procaine Hcl Anaphylaxis   Amoxicillin Itching    Level of Care/Admitting Diagnosis ED Disposition     ED Disposition  Admit   Condition  --   Comment  The patient appears reasonably stabilized for admission considering the current resources, flow, and capabilities available in the ED at this time, and I doubt any other [facility] requiring further screening and/or treatment in the ED prior to admission is  present.          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   HYPERLIPIDEMIA 03/27/2006   HYPERTENSION 03/27/2006   Intertrochanteric fracture of right hip (HCC) 07/13/2012   LUNG NODULE 09/01/2006   Excision, Bx Benighn   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"   Osteopenia 2004   Dexa 2004 showed Osteopenia, DEXA 03/2007 normal   Osteopenia    Other chronic  cystitis with hematuria    Pelvic fracture (HCC) 06/16/2016   S/P fall   Recurrent urinary tract infection    Seeing Urology   RETINOPATHY, BACKGROUND NOS 03/27/2006   Seizures (HCC)    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   Past Surgical History:  Procedure Laterality Date   ANKLE FRACTURE SURGERY Bilateral 08/2015   "right one was in 2 places"   CARDIAC CATHETERIZATION N/A 05/17/2015   Procedure: Left Heart Cath and Coronary Angiography;  Surgeon: Corky Crafts, MD;  Location: [facility] INVASIVE CV LAB;  Service: Cardiovascular;  Laterality: N/A;   CARDIAC CATHETERIZATION N/A 05/17/2015   Procedure: Coronary Balloon Angioplasty;  Surgeon: Corky Crafts, MD;  Location: [facility] INVASIVE CV LAB;  Service: Cardiovascular;  Laterality: N/A;   CARDIOVERSION N/A 08/05/2022   Procedure: CARDIOVERSION;  Surgeon: Chilton Si, MD;  Location: [facility] INVASIVE CV LAB;  Service: Cardiovascular;  Laterality: N/A;   FEMUR IM NAIL Right 07/15/2012   Procedure: INTRAMEDULLARY (IM) NAIL HIP;  Surgeon: Eulas Post, MD;  Location: [facility] OR;  Service: Orthopedics;  Laterality: Right;   FEMUR IM NAIL Left 12/02/2015   Procedure: INTRAMEDULLARY (IM) NAIL FEMORAL;  Surgeon: Eldred Manges, MD;  Location: [facility] OR;  Service: Orthopedics;  Laterality: Left;   FRACTURE SURGERY     INCISION AND DRAINAGE HIP Left 01/03/2016   Procedure: IRRIGATION AND DEBRIDEMENT HIP;  Surgeon: Loraine Leriche  Becky Sax, MD;  Location: [facility] OR;  Service: Orthopedics;  Laterality: Left;   IR FLUORO GUIDE CV LINE RIGHT  09/14/2022   IR FLUORO GUIDE CV LINE RIGHT  09/18/2022   IR FLUORO GUIDE CV LINE RIGHT  10/16/2022   IR FLUORO GUIDE CV LINE RIGHT  10/16/2022   IR REMOVAL TUN CV CATH W/O FL  10/12/2022   IR US GUIDE VASC ACCESS RIGHT  09/14/2022   IR US GUIDE VASC ACCESS RIGHT  10/16/2022   IR US GUIDE VASC ACCESS RIGHT  10/16/2022   SHOULDER SURGERY Bilateral    "don't remember which side"     A IV Location/Drains/Wounds Patient Lines/Drains/Airways Status      Active Line/Drains/Airways     Name Placement date Placement time Site Days   Peripheral IV 10/26/22 20 G Anterior;Left Forearm 10/26/22  0314  Forearm  less than 1   Peripheral IV 10/26/22 Right;Posterior Wrist 10/26/22  0419  Wrist  less than 1   Hemodialysis Catheter Right Internal jugular Double lumen Permanent (Tunneled) 10/16/22  0841  Internal jugular  10   Tunneled CVC Double Lumen (Radiology) 10/16/22 Right Internal jugular 22 cm 0 cm 10/16/22  0838  -- 10   Pressure Injury 10/07/22 Sacrum Posterior;Mid Stage 1 -  Intact skin with non-blanchable redness of a localized area usually over a bony prominence. 10/07/22  1730  -- 19   Wound / Incision (Open or Dehisced) 09/24/22 (IAD) Incontinence Associated Dermatitis Sacrum Right;Left pink, incontinence moisture associated skin breakdown 09/24/22  0800  Sacrum  32            Intake/Output Last 24 hours No intake or output data in the 24 hours ending 10/26/22 0744  Labs/Imaging Results for orders placed or performed during the hospital encounter of 10/26/22 (from the past 48 hour(s))  CBG monitoring, ED     Status: Abnormal   Collection Time: 10/26/22  3:05 AM  Result Value Ref Range   Glucose-Capillary 109 (H) 70 - 99 mg/dL    Comment: Glucose reference range applies only to samples taken after fasting for at least 8 hours.  Troponin I (High Sensitivity)     Status: None   Collection Time: 10/26/22  3:13 AM  Result Value Ref Range   Troponin I (High Sensitivity) 11 <18 ng/L    Comment: (NOTE) Elevated high sensitivity troponin I (hsTnI) values and significant  changes across serial measurements may suggest ACS but many other  chronic and acute conditions are known to elevate hsTnI results.  Refer to the "Links" section for chest pain algorithms and additional  guidance. Performed at [facility], [address], [place], [place] [address]   Brain natriuretic peptide     Status: Abnormal   Collection Time: 10/26/22   3:13 AM  Result Value Ref Range   B Natriuretic Peptide 1,951.8 (H) 0.0 - 100.0 pg/mL    Comment: Performed at [facility], [address] [place], [place] [address]  Comprehensive metabolic panel     Status: Abnormal   Collection Time: 10/26/22  3:13 AM  Result Value Ref Range   Sodium 132 (L) 135 - 145 mmol/L   Potassium 4.0 3.5 - 5.1 mmol/L   Chloride 94 (L) 98 - 111 mmol/L   CO2 27 22 - 32 mmol/L   Glucose, Bld 124 (H) 70 - 99 mg/dL    Comment: Glucose reference range applies only to samples taken after fasting for at least 8 hours.  BUN 16 8 - 23 mg/dL   Creatinine, Ser 1.61 (H) 0.44 - 1.00 mg/dL   Calcium 8.5 (L) 8.9 - 10.3 mg/dL   Total Protein 6.2 (L) 6.5 - 8.1 g/dL   Albumin 1.9 (L) 3.5 - 5.0 g/dL   AST 40 15 - 41 U/L   ALT 12 0 - 44 U/L   Alkaline Phosphatase 190 (H) 38 - 126 U/L   Total Bilirubin 0.5 0.3 - 1.2 mg/dL   GFR, Estimated 23 (L) >60 mL/min    Comment: (NOTE) Calculated using the CKD-EPI Creatinine Equation (2021)    Anion gap 11 5 - 15    Comment: Performed at [facility], [address] [place], [place] [address]  CBC with Differential     Status: Abnormal   Collection Time: 10/26/22  3:13 AM  Result Value Ref Range   WBC 12.1 (H) 4.0 - 10.5 K/uL   RBC 3.34 (L) 3.87 - 5.11 MIL/uL   Hemoglobin 9.6 (L) 12.0 - 15.0 g/dL   HCT 54.0 (L) 98.1 - 19.1 %   MCV 94.9 80.0 - 100.0 fL   MCH 28.7 26.0 - 34.0 pg   MCHC 30.3 30.0 - 36.0 g/dL   RDW 47.8 (H) 29.5 - 62.1 %   Platelets 319 150 - 400 K/uL   nRBC 0.0 0.0 - 0.2 %   Neutrophils Relative % 84 %   Neutro Abs 10.2 (H) 1.7 - 7.7 K/uL   Lymphocytes Relative 10 %   Lymphs Abs 1.2 0.7 - 4.0 K/uL   Monocytes Relative 5 %   Monocytes Absolute 0.6 0.1 - 1.0 K/uL   Eosinophils Relative 0 %   Eosinophils Absolute 0.0 0.0 - 0.5 K/uL   Basophils Relative 0 %   Basophils Absolute 0.0 0.0 - 0.1 K/uL   Immature Granulocytes 1 %   Abs Immature Granulocytes 0.08 (H) 0.00 - 0.07 K/uL    Comment:  Performed at [facility], [address], [place], [place] [address]  T4, free     Status: Abnormal   Collection Time: 10/26/22  3:13 AM  Result Value Ref Range   Free T4 1.35 (H) 0.61 - 1.12 ng/dL    Comment: (NOTE) Biotin ingestion may interfere with free T4 tests. If the results are inconsistent with the TSH level, previous test results, or the clinical presentation, then consider biotin interference. If needed, order repeat testing after stopping biotin. Performed at [facility], [address] [place], [place] [address]   TSH     Status: Abnormal   Collection Time: 10/26/22  3:13 AM  Result Value Ref Range   TSH 10.745 (H) 0.350 - 4.500 uIU/mL    Comment: Performed by a 3rd Generation assay with a functional sensitivity of <=0.01 uIU/mL. Performed at [facility], [address] [place], [place] [address]   Lactic acid, plasma     Status: None   Collection Time: 10/26/22  4:19 AM  Result Value Ref Range   Lactic Acid, Venous 0.7 0.5 - 1.9 mmol/L    Comment: Performed at [facility], [address] [place], [place] [address]  CBG monitoring, ED     Status: Abnormal   Collection Time: 10/26/22  5:07 AM  Result Value Ref Range   Glucose-Capillary 105 (H) 70 - 99 mg/dL    Comment: Glucose reference range applies only to samples taken after fasting for at least 8 hours.  Lactic acid, plasma  Status: None   Collection Time: 10/26/22  6:05 AM  Result Value Ref Range   Lactic Acid, Venous 0.7 0.5 - 1.9 mmol/L    Comment: Performed at [facility], [address] [place], [place] [address]  Troponin I (High Sensitivity)     Status: None   Collection Time: 10/26/22  6:05 AM  Result Value Ref Range   Troponin I (High Sensitivity) 10 <18 ng/L    Comment: (NOTE) Elevated high sensitivity troponin I (hsTnI) values and significant  changes across serial measurements may suggest ACS but many other  chronic and acute conditions are known to  elevate hsTnI results.  Refer to the "Links" section for chest pain algorithms and additional  guidance. Performed at [facility], [address] [place], [place] [address]   CBG monitoring, ED     Status: Abnormal   Collection Time: 10/26/22  6:10 AM  Result Value Ref Range   Glucose-Capillary 107 (H) 70 - 99 mg/dL    Comment: Glucose reference range applies only to samples taken after fasting for at least 8 hours.   *Note: Due to a large number of results and/or encounters for the requested time period, some results have not been displayed. A complete set of results can be found in Results Review.   CT Head Wo Contrast  Result Date: 10/26/2022 CLINICAL DATA:  Mental status change with unknown cause EXAM: CT HEAD WITHOUT CONTRAST TECHNIQUE: Contiguous axial images were obtained from the base of the skull through the vertex without intravenous contrast. RADIATION DOSE REDUCTION: This exam was performed according to the departmental dose-optimization program which includes automated exposure control, adjustment of the mA and/or kV according to patient size and/or use of iterative reconstruction technique. 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 Low-density in the cerebral white matter attributed to chronic small vessel ischemia. There is generalized brain atrophy. Vascular: No hyperdense vessel. Skull: Normal. Negative for fracture or focal lesion. Sinuses/Orbits: No acute finding. IMPRESSION: 1. No acute finding. 2. Aging brain and extensive left temporal encephalomalacia. Electronically Signed   By: Tiburcio Pea M.D.   On: 10/26/2022 06:02   DG Chest Portable 1 View  Result Date: 10/26/2022 CLINICAL DATA:  Difficulty breathing EXAM: PORTABLE CHEST 1 VIEW COMPARISON:  10/11/2022  FINDINGS: Cardiac shadow is enlarged but stable. Aortic calcifications are seen. Dialysis catheter is again noted. Increased vascular congestion is noted when compare with the prior exam. No significant edema is noted. No acute bony abnormality is seen. Old rib fractures are noted bilaterally. Tunneled central venous line is noted with the catheter tip at the cavoatrial junction. IMPRESSION: Changes of vascular congestion without significant edema. Electronically Signed   By: Alcide Clever M.D.   On: 10/26/2022 03:58    Pending Labs Unresulted Labs (From admission, onward)     Start     Ordered   10/26/22 0336  Blood culture (routine x 2)  BLOOD CULTURE X 2,   R      10/26/22 0336   10/26/22 0336  Urinalysis, Routine w reflex microscopic -Urine, Catheterized  Once,   URGENT       Question:  Specimen Source  Answer:  Urine, Catheterized   10/26/22 0336            Vitals/Pain Today's Vitals   10/26/22 0652 10/26/22  0700 10/26/22 0715 10/26/22 0730  BP:  (!) 142/58 (!) 141/57 (!) 140/64  Pulse:  (!) 58 (!) 56 (!) 57  Resp:  19 11 11   Temp: (!) 92.9 F (33.8 C)     TempSrc: Rectal     SpO2:  100% 100% 100%  PainSc:        Isolation Precautions No active isolations  Medications Medications  vancomycin (VANCOREADY) IVPB 1500 mg/300 mL (0 mg Intravenous Stopped 10/26/22 0650)  ceFEPIme (MAXIPIME) 2 g in sodium chloride 0.9 % 100 mL IVPB (0 g Intravenous Stopped 10/26/22 0518)  meropenem (MERREM) 1 g in sodium chloride 0.9 % 100 mL IVPB (0 g Intravenous Stopped 10/26/22 0705)    Mobility walks with device     Focused Assessments    R Recommendations: See Admitting Provider Note  Report given to:   Additional Notes:

## 2022-10-26 NOTE — TOC Initial Note (Addendum)
Transition of Care [facility]) - Initial/Assessment Note    Patient Details  Name: [name] [name] MRN: [id number] Date of Birth: 05-28-1940  Transition of Care [facility]) CM/SW Contact:    Mearl Latin, LCSW Phone Number: 10/26/2022, 1:23 PM  Clinical Narrative:                 Patient admitted to the hospital from [facility] for rehab. Patient is COVID+ and [facility] is able to accept her back with that status; CSW continuing to follow.   Expected Discharge Plan: Skilled Nursing Facility Barriers to Discharge: Continued Medical Work up   Patient Goals and CMS Choice            Expected Discharge Plan and Services In-house Referral: Clinical Social Work     Living arrangements for the past 2 months: Skilled Nursing Facility                                      Prior Living Arrangements/Services Living arrangements for the past 2 months: Skilled Nursing Facility Lives with:: Facility Resident Patient language and need for interpreter reviewed:: Yes        Need for Family Participation in Patient Care: Yes (Comment) Care giver support system in place?: Yes (comment)   Criminal Activity/Legal Involvement Pertinent to Current Situation/Hospitalization: No - Comment as needed  Activities of Daily Living      Permission Sought/Granted                  Emotional Assessment Appearance::  (COVID+) Attitude/Demeanor/Rapport: Unable to Assess Affect (typically observed): Unable to Assess Orientation: : Oriented to Self, Oriented to Place, Oriented to  Time, Oriented to Situation Alcohol / Substance Use: Not Applicable Psych Involvement: No (comment)  Admission diagnosis:  Somnolence [R40.0] Confusion [R41.0] Hypoglycemia [E16.2] Acute encephalopathy [G93.40] Hypothermia, initial encounter [T68.XXXA] Patient Active Problem List   Diagnosis Date Noted   Hypothermia 10/26/2022   Hypoglycemia 10/26/2022   Somnolence 10/26/2022   Acute encephalopathy  10/26/2022   SIRS (systemic inflammatory response syndrome) (HCC) 10/26/2022   COVID-19 virus infection 10/26/2022   Chronic hypotension 10/26/2022   Hemodialysis catheter infection (HCC) 10/13/2022   ESBL (extended spectrum beta-lactamase) producing bacteria infection 10/13/2022   Pressure injury of skin 10/13/2022   Cardiac arrest (HCC) 10/07/2022   ESRD (end stage renal disease) (HCC) 10/07/2022   Hypokalemia 10/07/2022   Cardiac arrest, cause unspecified (HCC) 10/07/2022   Hyperkalemia 09/11/2022   Anemia in chronic kidney disease (CKD) 09/09/2022   Acute on chronic systolic CHF (congestive heart failure) (HCC) 09/09/2022   ESBL Bacteremia due to UTI 09/07/2022   Acute CVA (cerebrovascular accident) (HCC) 09/06/2022   Paroxysmal atrial fibrillation (HCC) 08/04/2022   Atrial fibrillation with rapid ventricular response (HCC) 08/03/2022   Acute renal failure superimposed on stage 4 chronic kidney disease (HCC) 07/20/2022   Supratherapeutic INR 07/08/2022   Hemorrhagic cystitis 07/08/2022   Gross hematuria 07/07/2022   CKD (chronic kidney disease) stage 4, GFR 15-29 ml/min (HCC) 04/10/2020   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   Osteoporosis 07/22/2018   Closed compression fracture of L1 vertebra (HCC) 07/19/2018   Cardiomyopathy (HCC) 04/07/2018   Foot ulcer (HCC) 07/24/2016   Gram-negative infection    CKD (chronic kidney disease) stage 3, GFR 30-59 ml/min (HCC)    Postoperative anemia due to acute blood loss 01/04/2016   Left hip postoperative wound infection 01/02/2016  Paroxysmal atrial fibrillation (HCC) 05/20/2015   CAD S/P percutaneous coronary angioplasty 05/20/2015   Acute systolic CHF (congestive heart failure) (HCC) 05/20/2015   Acute kidney injury superimposed on chronic kidney disease (HCC) 05/11/2015   PCP NOTES >>>>>>>>>>>>>>>> 12/28/2014   Memory loss 05/17/2014   Diabetes (HCC) 01/23/2014   Anxiety and depression, PCP notes  11/24/2013   Severe obesity  (BMI >= 40) (HCC) 07/14/2013   Encounter for therapeutic drug monitoring 06/02/2013   History of encephalitis 11/18/2012   Intertrochanteric fracture of right hip (HCC) 07/13/2012   GERD (gastroesophageal reflux disease) 10/05/2011   Seizure disorder (HCC) 05/24/2011   Acute respiratory failure with hypoxia (HCC) 05/24/2011   Hyponatremia 05/17/2011   Annual physical exam >>>>>>>>>>>>>>>>>>>>>>>>>>>>>>>.. 02/10/2011   LUNG NODULE 09/01/2006   Hyperlipidemia 03/27/2006   Hereditary and idiopathic peripheral neuropathy 03/27/2006   RETINOPATHY, BACKGROUND NOS 03/27/2006   Essential hypertension 03/27/2006   PCP:  [name], MD Pharmacy:   [facility] [address] [place], [place] - [address] [place] [address] Phone: [phone] Fax: [phone]  [facility] [address] [place], [place] - [address] [place] [address] Phone: [phone] Fax: [phone]     Social Determinants of Health (SDOH) Social History: SDOH Screenings   Food Insecurity: No Food Insecurity (10/16/2022)  Housing: Low Risk  (10/16/2022)  Transportation Needs: No Transportation Needs (10/16/2022)  Utilities: Not At Risk (10/16/2022)  Alcohol Screen: Low Risk  (08/22/2020)  Depression (PHQ2-9): Low Risk  (08/21/2022)  Financial Resource Strain: Low Risk  (08/22/2020)  Physical Activity: Inactive (08/22/2020)  Social Connections: Moderately Integrated (08/22/2020)  Stress: No Stress Concern Present (08/22/2020)  Tobacco Use: Medium Risk (10/26/2022)   SDOH Interventions:     Readmission Risk Interventions    10/26/2022    1:22 PM 08/06/2022    3:31 PM 07/23/2022   12:43 PM  Readmission Risk Prevention Plan  Transportation Screening Complete Complete Complete  PCP or Specialist Appt within 5-7 Days   Complete  PCP or Specialist Appt within 3-5 Days  Complete   Home Care Screening   Complete  Medication Review (RN CM)   Referral  to Pharmacy  HRI or Home Care Consult  Complete   Social Work Consult for Recovery Care Planning/Counseling  --   Palliative Care Screening  Not Applicable   Medication Review Oceanographer) Complete Complete   PCP or Specialist appointment within 3-5 days of discharge Complete    HRI or Home Care Consult Complete    SW Recovery Care/Counseling Consult Complete    Palliative Care Screening Not Applicable    Skilled Nursing Facility Complete

## 2022-10-26 NOTE — Progress Notes (Signed)
Pharmacy Antibiotic Note  [name] [name] is a 82 y.o. female admitted on 10/26/2022 with sepsis.  Pharmacy has been consulted for vancomycin and meropenem dosing. Pt is hypothermic and WBC is elevated. She just completed a course of ertapenem for a ESBL bacteremia.   Plan: Vancomycin 750mg  QHD Meropenem 500mg  IV Q24H (s/p HD) F/u renal plans, C&S, clinical status and levels PRN     Temp (24hrs), Avg:91.9 F (33.3 C), Min:91.4 F (33 C), Max:92.9 F (33.8 C)  Recent Labs  Lab 10/26/22 0313 10/26/22 0419 10/26/22 0605  WBC 12.1*  --   --   CREATININE 2.13*  --   --   LATICACIDVEN  --  0.7 0.7    Estimated Creatinine Clearance: 22 mL/min (A) (by C-G formula based on SCr of 2.13 mg/dL (H)).    Allergies  Allergen Reactions   Procaine Hcl Anaphylaxis   Amoxicillin Itching    Antimicrobials this admission: Vanc 8/26>> Mero 8/26>> Cefepime x 1 8/26  Dose adjustments this admission: N/A  Microbiology results: Pending  Thank you for allowing pharmacy to be a part of this patient's care.  Susann Lawhorne, Drake Leach 10/26/2022 9:32 AM

## 2022-10-26 NOTE — Plan of Care (Signed)
  Problem: Fluid Volume: Goal: Ability to maintain a balanced intake and output will improve Outcome: Progressing   Problem: Health Behavior/Discharge Planning: Goal: Ability to manage health-related needs will improve Outcome: Progressing   Problem: Metabolic: Goal: Ability to maintain appropriate glucose levels will improve Outcome: Progressing   Problem: Nutritional: Goal: Maintenance of adequate nutrition will improve Outcome: Progressing   Problem: Skin Integrity: Goal: Risk for impaired skin integrity will decrease Outcome: Progressing   Problem: Tissue Perfusion: Goal: Adequacy of tissue perfusion will improve Outcome: Progressing   Problem: Clinical Measurements: Goal: Diagnostic test results will improve Outcome: Progressing Goal: Cardiovascular complication will be avoided Outcome: Progressing   Problem: Activity: Goal: Risk for activity intolerance will decrease Outcome: Progressing   Problem: Nutrition: Goal: Adequate nutrition will be maintained Outcome: Progressing   Problem: Coping: Goal: Level of anxiety will decrease Outcome: Progressing   Problem: Elimination: Goal: Will not experience complications related to bowel motility Outcome: Progressing

## 2022-10-26 NOTE — ED Triage Notes (Signed)
Pt was checked on by [facility] staff and she was found hypoxic and hypoglycemic. She has hx of some confusion and fluid overload. She seems a bit "off" compared to them.   CBG 127 HR 60s 96% O2 4L  Has PICC line and port for dialysis.

## 2022-10-27 DIAGNOSIS — R400 Somnolence: Secondary | ICD-10-CM

## 2022-10-27 DIAGNOSIS — Z515 Encounter for palliative care: Secondary | ICD-10-CM

## 2022-10-27 DIAGNOSIS — T68XXXA Hypothermia, initial encounter: Secondary | ICD-10-CM

## 2022-10-27 DIAGNOSIS — U071 COVID-19: Secondary | ICD-10-CM | POA: Diagnosis not present

## 2022-10-27 LAB — GLUCOSE, CAPILLARY
Glucose-Capillary: 77 mg/dL (ref 70–99)
Glucose-Capillary: 99 mg/dL (ref 70–99)
Glucose-Capillary: 133 mg/dL — ABNORMAL HIGH (ref 70–99)
Glucose-Capillary: 201 mg/dL — ABNORMAL HIGH (ref 70–99)
Glucose-Capillary: 144 mg/dL — ABNORMAL HIGH (ref 70–99)

## 2022-10-27 LAB — RENAL FUNCTION PANEL
Albumin: 1.5 g/dL — ABNORMAL LOW (ref 3.5–5.0)
Anion gap: 7 (ref 5–15)
BUN: 8 mg/dL (ref 8–23)
CO2: 28 mmol/L (ref 22–32)
Calcium: 7.9 mg/dL — ABNORMAL LOW (ref 8.9–10.3)
Chloride: 96 mmol/L — ABNORMAL LOW (ref 98–111)
Creatinine, Ser: 1.41 mg/dL — ABNORMAL HIGH (ref 0.44–1.00)
GFR, Estimated: 38 mL/min — ABNORMAL LOW (ref >60)
Glucose, Bld: 98 mg/dL (ref 70–99)
Phosphorus: 2.5 mg/dL (ref 2.5–4.6)
Potassium: 3.8 mmol/L (ref 3.5–5.1)
Sodium: 131 mmol/L — ABNORMAL LOW (ref 135–145)

## 2022-10-27 LAB — URINALYSIS, ROUTINE W REFLEX MICROSCOPIC
Bilirubin Urine: NEGATIVE
Glucose, UA: NEGATIVE mg/dL
Ketones, ur: NEGATIVE mg/dL
Nitrite: NEGATIVE
Protein, ur: 100 mg/dL — AB
Specific Gravity, Urine: 1.01 (ref 1.005–1.030)
pH: 7 (ref 5.0–8.0)

## 2022-10-27 LAB — CBC
HCT: 27.5 % — ABNORMAL LOW (ref 36.0–46.0)
Hemoglobin: 8.7 g/dL — ABNORMAL LOW (ref 12.0–15.0)
MCH: 29.4 pg (ref 26.0–34.0)
MCHC: 31.6 g/dL (ref 30.0–36.0)
MCV: 92.9 fL (ref 80.0–100.0)
Platelets: 272 10*3/uL (ref 150–400)
RBC: 2.96 MIL/uL — ABNORMAL LOW (ref 3.87–5.11)
RDW: 19.6 % — ABNORMAL HIGH (ref 11.5–15.5)
WBC: 7.9 10*3/uL (ref 4.0–10.5)
nRBC: 0 % (ref 0.0–0.2)

## 2022-10-27 LAB — URINALYSIS, MICROSCOPIC (REFLEX)

## 2022-10-27 LAB — HEPATITIS B SURFACE ANTIBODY, QUANTITATIVE: Hep B S AB Quant (Post): <3.5 m[IU]/mL — ABNORMAL LOW

## 2022-10-27 MED ORDER — FUROSEMIDE 10 MG/ML IJ SOLN
80.0000 mg | Freq: Once | INTRAMUSCULAR | Status: AC
  Administered 2022-10-27: 80 mg via INTRAVENOUS
  Filled 2022-10-27: qty 8

## 2022-10-27 MED ORDER — SODIUM CHLORIDE 0.9 % IV SOLN
200.0000 mg | Freq: Once | INTRAVENOUS | Status: AC
  Administered 2022-10-27: 200 mg via INTRAVENOUS
  Filled 2022-10-27: qty 40

## 2022-10-27 MED ORDER — SODIUM CHLORIDE 0.9 % IV SOLN
100.0000 mg | Freq: Every day | INTRAVENOUS | Status: AC
  Administered 2022-10-28 – 2022-10-29 (×2): 100 mg via INTRAVENOUS
  Filled 2022-10-27 (×2): qty 20

## 2022-10-27 MED ORDER — ENSURE ENLIVE PO LIQD
237.0000 mL | Freq: Two times a day (BID) | ORAL | Status: DC
  Administered 2022-10-28 – 2022-10-29 (×2): 237 mL via ORAL

## 2022-10-27 MED ORDER — RENA-VITE PO TABS
1.0000 | ORAL_TABLET | Freq: Every day | ORAL | Status: DC
  Administered 2022-10-27 – 2022-10-30 (×4): 1 via ORAL
  Filled 2022-10-27 (×4): qty 1

## 2022-10-27 MED ORDER — DARBEPOETIN ALFA 100 MCG/0.5ML IJ SOSY
100.0000 ug | PREFILLED_SYRINGE | Freq: Once | INTRAMUSCULAR | Status: AC
  Administered 2022-10-27: 100 ug via SUBCUTANEOUS
  Filled 2022-10-27: qty 0.5

## 2022-10-27 NOTE — Assessment & Plan Note (Signed)
Requiring up to 4L O2 and tachypneic on admission.

## 2022-10-27 NOTE — Assessment & Plan Note (Signed)
Mild aysmptomatic.   - Fluid balance per HD

## 2022-10-27 NOTE — Hospital Course (Signed)
Mrs. Bonkoski is an 82 y.o. F with advanced CKD now on HD, sCHF EF 35-40%, hx HSV encephalitis 2014, blind left eye, pAF on Eliquis, IDDM, seizures, hx ESBL UTI and recent history stroke, ESBL bacteremia, and new start HD who presented with recurrent hypoglycemia from SNF.     Significant events: 7/7-7/24: Admitted for dizziness, found to have stroke, then incidentally found to have ESBL bacteremia, then developed AKI on CKD requiring initiation of iHD; discharged to [facility]  8/7-8/17: Readmitted after LOC at dialysis center, received CPR x10 minutes, no epi; admitted to ICU, Cardiology and Nephrology consulted, again found to have ESBL bacteremia; tunneled HD cath removed, discharged to SNF with PICC  8/26: Readmitted with recurrent hypothermia, hypoglycemia, hypoxia; COVID+     Significant studies: 8/26 CXR: vascular congestion 8/26 CT head: no acute findings, extensive left temporal encephalomalacia   Significant microbiology data: 8/26 BCx x2: pending 8/26 COVID: positive    Procedures:    Consults: Nephrology Palliative Care Critical Care

## 2022-10-27 NOTE — Progress Notes (Signed)
Brief Palliative Medicine Progress Note:  PMT consult received and chart reviewed.   Discussed case with Dr. Maryfrances Bunnell. In context of patient's clinical improvement as well as PMT GOC discussions during previous hospitalization indicating goals for rehab with outpatient Palliative Care following, no further acute PMT needs.   PMT will sign off. Please re-consult if needs arise.   Thank you for allowing PMT to assist in the care of this patient.  Evyn Kooyman M. Katrinka [facility] Palliative Medicine Team Team Phone: [phone] NO CHARGE

## 2022-10-27 NOTE — Progress Notes (Signed)
[facility] Progress Note  Name: [name] [name] MRN: [id number] DOB: [birth year]  Chief Complaint:  Altered mental status   Subjective:  Last HD on 8/26 with 2 kg UF.   Strict ins/outs are not available.  No urine output is charted.  Per staff she hasn't made much urine.  She has a purewick.  Note that palliative care was consulted.  Team started remdesivir.  May be ready for discharge as early as Thursday per primary team   Review of systems:  Denies shortness of breath  Denies chest pain  Denies n/v   ------------  Background on consult:  [name] [name] is a 82 y.o. female with ?AKI v. ESRD, HTN, HL, T2DM, CAD, A-fib on Eliquis who was admitted with AMS.   Evaluated in ED - at that time, she was not really responsive. Husband was at bedside and provided history. Was in her usual state of (frail) health yesterday. He was called by SNF this AM that she was being transported to ED for non-responsiveness. He denies any new complaints over the past few days. No new medications at he is aware of. In the ED, she was extremely hypothermic - T91.44F - and she was hypoxic. Sepsis protocol activated. She was placed under a warming blanket, pan-cultured. Labs showed Na 132, K 4, CO2 27, BUN 16, Cr 2.13, Alb 1.9, Trop 11 -> 10, WBC 12.1, Hgb 9.6, Plts 319. Flu negative, but COVID POSITIVE. Head CT without acute CVA. CXR with ^ vascular congestion.  Has been on dialysis since 08/2022 - considered "AKI" by her outpatient unit. Baseline Cr was 2's - up to 4.5 range with uremia and overload, started HD at that time. Reviewing outpatient records - losing weight and dry weight is being lowered, ?assuming doesn't make much urine. Her muscle mass is very low so Cr can be deceivingly low. Should have finished a course of Ertapenem for Klebsiella bacteremia yesterday.   Intake/Output Summary (Last 24 hours) at 10/27/2022 0847 Last data filed at 10/27/2022 0012 Gross per 24 hour  Intake --   Output 2000 ml  Net -2000 ml    Vitals:  Vitals:   10/27/22 0153 10/27/22 0159 10/27/22 0523 10/27/22 0748  BP: (!) 134/53  (!) 140/81 (!) 149/59  Pulse:   67 67  Resp:    18  Temp: 97.6 F (36.4 C)  (!) 97.3 F (36.3 C) (!) 97.2 F (36.2 C)  TempSrc: Oral  Axillary Oral  SpO2:   100% 100%  Weight:  74.1 kg    Height:         Physical Exam:  General adult female in bed in no acute distress HEENT normocephalic atraumatic extraocular movements intact sclera anicteric Neck supple trachea midline Lungs clear to auscultation bilaterally normal work of breathing at rest on 2 liters oxygen Heart S1S2 no rub Abdomen soft nontender nondistended Extremities 1+2+ edema lower extremities Psych normal mood and affect Access RIJ tunn catheter in place   Medications reviewed   Labs:     Latest Ref Rng & Units 10/27/2022    5:27 AM 10/26/2022    3:13 AM 10/17/2022    1:29 AM  BMP  Glucose 70 - 99 mg/dL 98  528  413   BUN 8 - 23 mg/dL 8  16  13    Creatinine 0.44 - 1.00 mg/dL 2.44  0.10  2.72   Sodium 135 - 145 mmol/L 131  132  131   Potassium 3.5 - 5.1 mmol/L  3.8  4.0  4.6   Chloride 98 - 111 mmol/L 96  94  94   CO2 22 - 32 mmol/L 28  27  28    Calcium 8.9 - 10.3 mg/dL 7.9  8.5  7.7    Dialysis Orders:  MWF at [facility] 3:30hr, 400/600, EDW 74.1kg, 3K/2.5Ca, TDC, no heparin - Last HD 8/23, left at 73.6kg - Mircera IV q 2 weeks - last given 7/29   Assessment/Plan:   # AMS/encephalopathy  - may be secondary to covid   - improving per charting  - work-up per primary team    # Covid 19 pna - therapies per primary team    # Dialysis-dependent AKI - HD per MWF schedule as needed.  Will assess HD needs daily while here - I have re-ordered strict ins/outs as not being collected - Lasix 80 mg IV once ordered today    # HTN  - optimize volume with HD as able    # Anemia of CKD  - ESA due - aranesp 100 mcg once today    # Metabolic bone disease  - phos acceptable -  aki patient     # Atrial fibrillation  - rate controlling agents per primary team   Disposition - per primary team.  May be discharged as early as Thursday per primary team   Estanislado Emms, MD 10/27/2022 9:26 AM

## 2022-10-27 NOTE — Assessment & Plan Note (Signed)
At baseline she had no memory impairment until her serious illness began in July.  On admission here she was somnolent and less responsive than recent baseline, but today appears to be improving. - Standard delirium precautions: blinds open and lights on during day, TV off, minimize interruptions at night, glasses/hearing aids, PT/OT, avoiding Beers list medications

## 2022-10-27 NOTE — Progress Notes (Signed)
Initial Nutrition Assessment  DOCUMENTATION CODES:   Non-severe (moderate) malnutrition in context of chronic illness  INTERVENTION:  Liberalize diet to regular to promote adequate PO intake in setting of malnutrition and advanced age  Meal ordering with assistance Ensure Enlive po BID, each supplement provides 350 kcal and 20 grams of protein. Renal MVI with minerals daily  NUTRITION DIAGNOSIS:   Moderate Malnutrition related to chronic illness (CHF, cardiac arrest, ESRD) as evidenced by mild fat depletion, moderate muscle depletion, mild muscle depletion.  GOAL:   Patient will meet greater than or equal to 90% of their needs  MONITOR:   PO intake, Supplement acceptance, Labs, Weight trends, Skin  REASON FOR ASSESSMENT:   Consult Assessment of nutrition requirement/status  ASSESSMENT:   Pt with multiple recent admissions for stroke, ESBL bacteremia, AKI on CKD requiring iHD, cardiac arrest. Currently presented with recurrent hypothermia, hypoglycemia, hypoxia and COVID-19. PMH significant for hypotension, HLD, agib, systolic CHF, CVA, ESRD on HD, T2DM.  Nephrology following and assessing need for HD daily.   Pt resting soundly at time of visit. Unable to obtain detailed nutrition related history at this time. No family present.   Spoke with NT who reports that she has been assisting with meal ordering. This morning she ordered 2 bowls of oatmeal but she only consumed 1.   Reviewed weight history which appears to remain variable between ~72-77 kg. No significant weight loss noted over the last 5 months. Will continue to monitor trends throughout admission.    Edema: mild pitting BLE  Pt's renal lytes are WDL. Given acute illness, moderate malnutrition and advanced age, pt would benefit from a regular diet to promote optimal nutritional intake.   Medications: SSI 0-6 units TID (not given), protonix, miralax  Labs: sodium 131, Cr 1.41, GFR 38, CBG's 72-134 x24 hours,  HgbA1c 9.7%  NUTRITION - FOCUSED PHYSICAL EXAM:  Flowsheet Row Most Recent Value  Orbital Region Mild depletion  Upper Arm Region No depletion  Thoracic and Lumbar Region No depletion  Buccal Region Mild depletion  Temple Region Mild depletion  Clavicle Bone Region No depletion  Clavicle and Acromion Bone Region No depletion  Scapular Bone Region No depletion  Dorsal Hand Mild depletion  Patellar Region Moderate depletion  Anterior Thigh Region Moderate depletion  Posterior Calf Region Severe depletion  Edema (RD Assessment) None  Hair Reviewed  Eyes Unable to assess  Mouth Unable to assess  Skin Reviewed  Nails Reviewed       Diet Order:   Diet Order             Diet regular Room service appropriate? Yes with Assist; Fluid consistency: Thin; Fluid restriction: 1200 mL Fluid  Diet effective now                   EDUCATION NEEDS:   Not appropriate for education at this time  Skin:  Skin Assessment: Skin Integrity Issues: Skin Integrity Issues:: Stage I Stage I: mid sacrum  Last BM:  PTA  Height:   Ht Readings from Last 1 Encounters:  10/26/22 5' 9.02" (1.753 m)    Weight:   Wt Readings from Last 1 Encounters:  10/27/22 74.1 kg   BMI:  Body mass index is 24.11 kg/m.  Estimated Nutritional Needs:   Kcal:  1600-1800  Protein:  85-95g  Fluid:  >/=1.6L  Drusilla Kanner, RDN, LDN Clinical Nutrition

## 2022-10-27 NOTE — Plan of Care (Signed)
  Problem: Elimination: Goal: Will not experience complications related to bowel motility Outcome: Progressing   

## 2022-10-27 NOTE — Assessment & Plan Note (Signed)
Hypoglycemic on admission Glucoses okay since then - Hold 70/30 - SS corrections

## 2022-10-27 NOTE — Assessment & Plan Note (Signed)
Admitted in early July with renal failure, stroke and also found to have ESBL bacteremia.  Repeat cultures at that time showed cleared infection, and a tunneled line was placed.  Readmitted 8/7 with cardiac arrest, found again to have ESBL E coli bacteremia.  Tunneled line removed, got 4d holiday, cultures again negative, and current HD cath placed - Follow blood cultures - Continue empiric Merrem

## 2022-10-27 NOTE — Assessment & Plan Note (Signed)
Continue midodrine  

## 2022-10-27 NOTE — Assessment & Plan Note (Signed)
Suspect there may be some fluid overload as well.  EF 25-30% on echo early Aug. - Continue midodrine - HD per nephrology - Diuretics per nephrology - Continue Lipitor

## 2022-10-27 NOTE — Progress Notes (Signed)
Progress Note   Patient: [name] [name] WGN:[id number] DOB: 07/23/1940 DOA: 10/26/2022     1 DOS: the patient was seen and examined on 10/27/2022 at 8:52 AM      Brief hospital course: [name] [name] is an 82 y.o. F with advanced CKD now on HD, sCHF EF 35-40%, hx HSV encephalitis 2014, blind left eye, pAF on Eliquis, IDDM, seizures, hx ESBL UTI and recent history stroke, ESBL bacteremia, and new start HD who presented with recurrent hypoglycemia from SNF.     Significant events: 7/7-7/24: Admitted for dizziness, found to have stroke, then incidentally found to have ESBL bacteremia, then developed AKI on CKD requiring initiation of iHD; discharged to [facility]  8/7-8/17: Readmitted after LOC at dialysis center, received CPR x10 minutes, no epi; admitted to ICU, Cardiology and Nephrology consulted, again found to have ESBL bacteremia; tunneled HD cath removed, discharged to SNF with PICC  8/26: Readmitted with recurrent hypothermia, hypoglycemia, hypoxia; COVID+     Significant studies: 8/26 CXR: vascular congestion 8/26 CT head: no acute findings, extensive left temporal encephalomalacia   Significant microbiology data: 8/26 BCx x2: pending 8/26 COVID: positive    Procedures:    Consults: Nephrology Palliative Care Critical Care      Assessment and Plan: * Viral sepsis due to COVID-19 infection Presented with SIRS, COVID infection, respiratory failure and somnolence/encephalopathy.  Given her age, renal disease and hypoxia, I recommend 3d remdesivir  Given recent recurrent ESBL bacteremia, would continue Merrem at least 48 hours - Start remdesivir day 1 of 3 - Trend CRP - Flutter, IS - PT  - Continue Merrem - Follow blood cultures    Acute respiratory failure with hypoxia (HCC) Requiring up to 4L O2 and tachypneic on admission.  Acute metabolic encephalopathy due to COVID At baseline she had no memory impairment until her serious illness began in July.  On  admission here she was somnolent and less responsive than recent baseline, but today appears to be improving. 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  Still getting acute HD and without clear resumption of renal function yet. - Consult Nephrology, appreciate cares - Diuretics per Nephrology - Strict I'Os - Daily BMP    History of bacteremia Admitted in early July with renal failure, stroke and also found to have ESBL bacteremia.  Repeat cultures at that time showed cleared infection, and a tunneled line was placed.  Readmitted 8/7 with cardiac arrest, found again to have ESBL E coli bacteremia.  Tunneled line removed, got 4d holiday, cultures again negative, and current HD cath placed - Follow blood cultures - Continue empiric Merrem   Chronic hypotension - Continue midodrine  Acute on chronic systolic CHF (congestive heart failure) (HCC) Suspect there may be some fluid overload as well.  EF 25-30% on echo early Aug. - Continue midodrine - HD per nephrology - Diuretics per nephrology - Continue Lipitor  Hyponatremia Mild aysmptomatic.   - Fluid balance per HD  Paroxysmal atrial fibrillation (HCC) - Continue amiodarone and apixaban  Abnormal results of thyroid function studies TSH and fT4 both elevated.  - Repeat TFTs in 1 month  Hyperlipidemia - Continue LIpitor  Anemia due to chronic kidney disease Hgb stable - Aranesp per Nephrology  Insulin-dependent  type 2 diabetes with hypoglycemia (HCC) Hypoglycemic on admission Glucoses okay since then - Hold 70/30 - SS corrections          Subjective: Patient is feeling better, she has no malaise, reports her confusion is better, reports  she is overall tired, has a cough, but has no significant orthopnea, swelling, fever, distress.     Physical Exam: BP (!) 149/59 (BP Location: Right Arm)   Pulse 67   Temp (!) 97.2 F (36.2 C) (Oral)   Resp 18   Ht 5' 9.02" (1.753 m)   Wt 74.1 kg   SpO2 100%   BMI 24.11 kg/m   Elderly adult female, sitting up in bed, alert and makes eye contact, responds to questions appropriately RRR, no murmurs, no peripheral edema respiratory rate seems normal Lung sounds diminished bilaterally, coarse congestion in both lungs but no rales or wheezes Abdomen soft no tenderness palpation Attention normal, affect pleasant, judgment and insight appear slightly impaired, short-term memory seems impaired, face symmetric, upper extremity strength generally weak but symmetric   Data Reviewed: Discussed with nephrology and palliative care Basic metabolic panel shows sodium 131, creatinine 1.4, albumin 1.5 Chest x-ray shows increased interstitial markings    Family Communication: None present    Disposition: Status is: Inpatient The patient was admitted with encephalopathy, sepsis from COVID  She is actually much better than I was expecting this morning, alert, and relatively asymptomatic  Given her recent recurrent ESBL, I feel compelled to continue her Merrem for now  Given her hypoxia which is new, we will plan to continue remdesivir, likely 3 full days, then possibly stop meropenem if cultures negative and discharged back to SNF        Author: Alberteen Sam, MD 10/27/2022 11:23 AM  For on call review [email or web address].

## 2022-10-27 NOTE — Assessment & Plan Note (Signed)
Patient's CKD worsened last month and she was started on HD as an outpatient.  Still getting acute HD and without clear resumption of renal function yet. - Consult Nephrology, appreciate cares - Diuretics per Nephrology - Strict I'Os - Daily BMP

## 2022-10-27 NOTE — Progress Notes (Signed)
Heart Failure Navigator Progress Note  Assessed for Heart & Vascular TOC clinic readiness.  Patient does not meet criteria due to ESRD and is on Dialysis per MD.   Navigator will sign off at this time.   Liandra Mendia,RN, BSN,MSN Heart Failure Nurse Navigator. Contact by secure chat only.

## 2022-10-27 NOTE — Assessment & Plan Note (Signed)
Continue amiodarone and apixaban 

## 2022-10-27 NOTE — Inpatient Diabetes Management (Signed)
Inpatient Diabetes Program Recommendations  AACE/ADA: New Consensus Statement on Inpatient Glycemic Control (2015)  Target Ranges:  Prepandial:   less than 140 mg/dL      Peak postprandial:   less than 180 mg/dL (1-2 hours)      Critically ill patients:  140 - 180 mg/dL   Lab Results  Component Value Date   GLUCAP 133 (H) 10/27/2022   HGBA1C 9.7 (H) 09/06/2022    Review of Glycemic Control  Latest Reference Range & Units 10/26/22 11:58 10/26/22 16:00 10/27/22 01:50 10/27/22 05:27 10/27/22 10:53  Glucose-Capillary 70 - 99 mg/dL 72 657 (H) 77 99 846 (H)  (H): Data is abnormally high  Latest Reference Range & Units 10/27/22 05:27  GFR, Estimated >60 mL/min 38 (L)  (L): Data is abnormally low  Diabetes history: DM Outpatient Diabetes medications: 70/30 10 units QAM & 5 units QPM Current orders for Inpatient glycemic control: Novolog 0-6 units TID  ESRD  Inpatient Diabetes Program Recommendations:    Hypoglycemic at SNF.  No insulin needs while inpatient thus far.  Might consider, discontinuing 70/30 at DC and switching to Lantus 5 units at bedtime and Novolog 0-6 units TID.  Will continue to follow while inpatient.  Thank you, Dulce Sellar, MSN, CDCES Diabetes Coordinator Inpatient Diabetes Program [phone] (team pager from 8a-5p)

## 2022-10-27 NOTE — Assessment & Plan Note (Signed)
-   Continue Lipitor

## 2022-10-27 NOTE — Assessment & Plan Note (Signed)
Hgb stable - Aranesp per Nephrology

## 2022-10-27 NOTE — Progress Notes (Addendum)
   10/27/22 0012  Vitals  Temp 97.8 F (36.6 C)  Temp Source Oral  BP (!) 88/48  MAP (mmHg) (!) 61  BP Location Right Arm  BP Method Automatic  Patient Position (if appropriate) Lying  Pulse Rate (!) 57  Pulse Rate Source Monitor  ECG Heart Rate (!) 57  Resp 20  During Treatment Monitoring  Intra-Hemodialysis Comments Tx completed  Post Treatment  Dialyzer Clearance Lightly streaked  Liters Processed 84  Fluid Removed (mL) 2000 mL  Tolerated HD Treatment Yes  Post-Hemodialysis Comments pt stable  Hemodialysis Catheter Right Internal jugular Double lumen Permanent (Tunneled)  Placement Date/Time: 10/16/22 0841   Placed prior to admission: No  Serial / Lot #: [id number]  Expiration Date: 03/02/27  Time Out: Correct patient;Correct site;Correct procedure  Maximum sterile barrier precautions: Hand hygiene;Cap;Mask;Sterile gown...  Site Condition No complications  Blue Lumen Status Heparin locked  Red Lumen Status Heparin locked  Catheter fill volume (Arterial) 1.5 cc  Catheter fill volume (Venous) 1.5  Dressing Type Transparent  Dressing Status Antimicrobial disc in place;Clean, Dry, Intact  Interventions New dressing  Drainage Description None  Post treatment catheter status Capped and Clamped   Last B/p 125/54, Hr 76, temp 96.5, run time 3.5hrs. Pt stable post tx. Report Dorann Lodge RN

## 2022-10-27 NOTE — Assessment & Plan Note (Addendum)
Presented with SIRS, COVID infection, respiratory failure and somnolence/encephalopathy.  Given her age, renal disease and hypoxia, I recommend 3d remdesivir  Given recent recurrent ESBL bacteremia, would continue Merrem at least 48 hours - Start remdesivir day 1 of 3 - Trend CRP - Flutter, IS - PT  - Continue Merrem - Follow blood cultures

## 2022-10-27 NOTE — Progress Notes (Signed)
Pt receives out-pt HD at [facility] [place] on MWF. Contacted pt's clinic to make staff aware of pt's covid diagnosis. Will assist as needed.   Olivia Canter Renal Navigator [phone]

## 2022-10-27 NOTE — Assessment & Plan Note (Signed)
TSH and fT4 both elevated.  - Repeat TFTs in 1 month

## 2022-10-28 DIAGNOSIS — E440 Moderate protein-calorie malnutrition: Secondary | ICD-10-CM | POA: Insufficient documentation

## 2022-10-28 DIAGNOSIS — U071 COVID-19: Secondary | ICD-10-CM | POA: Diagnosis not present

## 2022-10-28 LAB — RENAL FUNCTION PANEL
Albumin: <1.5 g/dL — ABNORMAL LOW (ref 3.5–5.0)
Anion gap: 8 (ref 5–15)
BUN: 16 mg/dL (ref 8–23)
CO2: 28 mmol/L (ref 22–32)
Calcium: 8.2 mg/dL — ABNORMAL LOW (ref 8.9–10.3)
Chloride: 98 mmol/L (ref 98–111)
Creatinine, Ser: 1.86 mg/dL — ABNORMAL HIGH (ref 0.44–1.00)
GFR, Estimated: 27 mL/min — ABNORMAL LOW (ref >60)
Glucose, Bld: 112 mg/dL — ABNORMAL HIGH (ref 70–99)
Phosphorus: 3 mg/dL (ref 2.5–4.6)
Potassium: 4.1 mmol/L (ref 3.5–5.1)
Sodium: 134 mmol/L — ABNORMAL LOW (ref 135–145)

## 2022-10-28 LAB — CBC
HCT: 27.8 % — ABNORMAL LOW (ref 36.0–46.0)
Hemoglobin: 8.7 g/dL — ABNORMAL LOW (ref 12.0–15.0)
MCH: 29 pg (ref 26.0–34.0)
MCHC: 31.3 g/dL (ref 30.0–36.0)
MCV: 92.7 fL (ref 80.0–100.0)
Platelets: 312 10*3/uL (ref 150–400)
RBC: 3 MIL/uL — ABNORMAL LOW (ref 3.87–5.11)
RDW: 19.5 % — ABNORMAL HIGH (ref 11.5–15.5)
WBC: 7.5 10*3/uL (ref 4.0–10.5)
nRBC: 0 % (ref 0.0–0.2)

## 2022-10-28 LAB — C-REACTIVE PROTEIN: CRP: 3.3 mg/dL — ABNORMAL HIGH (ref <1.0)

## 2022-10-28 LAB — GLUCOSE, CAPILLARY
Glucose-Capillary: 109 mg/dL — ABNORMAL HIGH (ref 70–99)
Glucose-Capillary: 212 mg/dL — ABNORMAL HIGH (ref 70–99)
Glucose-Capillary: 171 mg/dL — ABNORMAL HIGH (ref 70–99)
Glucose-Capillary: 173 mg/dL — ABNORMAL HIGH (ref 70–99)

## 2022-10-28 MED ORDER — FUROSEMIDE 40 MG PO TABS
80.0000 mg | ORAL_TABLET | Freq: Every day | ORAL | Status: DC
  Administered 2022-10-28 – 2022-10-29 (×2): 80 mg via ORAL
  Filled 2022-10-28 (×2): qty 2

## 2022-10-28 NOTE — Progress Notes (Signed)
PROGRESS NOTE    [name] GERGEL  [name]:[id number] DOB: [birth year] DOA: 10/26/2022 PCP: [name], MD    Brief Narrative:   [name] [name] is a 82 y.o. 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  Recently hospitalized 8/7 - 8/17 after having suspected cardiac arrest with TTE showing LVEF 25%.  Patient was found bacteremic with ESBL Klebsiella during that hospitalization in which infectious disease was consulted and recommended to complete 2 weeks of ertapenem.  Per EMS run sheet, patient has been having frequent glucose drops at SNF and had been given glucagon.  Also if she is becoming more hypoxic requiring nasal cannula.  In the ED, temperature 92.9 degrees Fahrenheit rectally, HR 58, RR 19, BP 142/58, SpO2 100% on 4 L nasal cannula.  WBC 12.1, hemoglobin 9.6, platelet count 319.  Sodium 132, potassium 4.0, chloride 94, CO2 27, glucose 124, BUN 16, creatinine 2.13.  AST 40, ALT 12, total bilirubin 0.5.  BNP 1951.8.  High sensitive troponin 11 followed by 10.  Lactic acid 0.7.  TSH 10.745, free T4 1.35.  Urinalysis with moderate leukocytes, negative nitrite, rare bacteria, 0-5 WBCs.  Cova-19 PCR positive.  Influenza/RSV PCR negative.  Assessment & Plan:   Acute metabolic encephalopathy likely secondary to hypoglycemia versus COVID viral infection Patient reportedly more confused at SNF, likely combination of acute COVID viral infection as well as hypoglycemia.  CT head without contrast with no acute finding, noted aging brain and extensive left temporal encephalomalacia.  Patient appears to be now back at her  typical baseline.  Hypoglycemia Hx type 2 diabetes mellitus Patient with reported episodes of hypoglycemia at SNF.  Current home regimen includes NPH 10 units before breakfast and 5 units before dinner.  Hemoglobin A1c 9.7 on 09/06/2022, poorly controlled. -- Holding home NPH; likely will need to discontinue vs change to only a long-acting insulin on discharge -- SSI for coverage -- CBGs before every meal/at bedtime  COVID viral infection COVID PCR positive on admission. -- Continue remdesivir to complete 3-day course -- Continue contact/airborne isolation precautions  History of ESBL Klebsiella bacteremia Patient recently hospitalized and currently on IV antibiotics for ESBL Klebsiella bacteremia.  Initially diagnosed on 10/07/2022, underwent line holiday with repeat blood cultures on 8/13 that were negative.  Per infectious disease, patient to complete 2-week course of IV antibiotics which would end on 10/25/2022.  Patient had follow-up scheduled with ID, Dr. Algis Liming on 8/26 but due to this hospitalization has missed that appointment.  Discussed with ID, given this hospitalization is unrelated to bacteremia no change in plan and okay to discontinue IV antibiotics.  Advanced CKD versus ESRD on HD Typically dialyzes outpatient at [facility] on MWF schedule. -- Nephrology following, appreciate assistance  Chronic hypotension -- Midodrine 5 mg p.o. twice daily and 10 mg on dialysis days  Chronic systolic congestive heart failure Unable to tolerate GDMT due to chronic hypotension BNP elevated on admission. --Continue fluid management per HD  Paroxysmal atrial fibrillation -- Continue Eliquis  Anemia of chronic medical/renal disease Hemoglobin 8.7, stable.  Abnormal thyroid studies TSH 10.745 and free T4 1.35.  Patient recently started on amiodarone. -- recheck thyroid studies  in a few weeks.  GERD -- Protonix 40 mg   DVT prophylaxis: apixaban (ELIQUIS) tablet 2.5 mg Start: 10/26/22  1000 apixaban (ELIQUIS) tablet 2.5 mg    Code Status: DNR Family Communication: No family present at bedside this morning  Disposition Plan:  Level of care: Telemetry Medical Status is: Inpatient Remains inpatient appropriate because: IV remdesivir, anticipate likely discharge back to SNF tomorrow once completes IV remdesivir course    Consultants:  Nephrology Palliative care  Procedures:  None  Antimicrobials:  Vancomycin 8/25 - 8/26 Meropenem 8/25 - 8/27 Cefepime 8/25 - 8/25   Subjective: Patient seen examined bedside, resting calmly.  Eating breakfast.  No specific questions or concerns at this morning.  Discussed we will complete IV remdesivir tomorrow and anticipate likely discharge back to SNF thereafter.  Denies headache, no dizziness, no chest pain, no shortness of breath, no fever/chills, no nausea/vomiting/diarrhea, no focal weakness, no fatigue, no paresthesias.  No acute events overnight per nursing staff.    Discussed with infectious disease, Dr. Manson Passey on 8/28; no need to continue antibiotics as completed 2-week course for Klebsiella bacteremia and this hospitalization unrelated to previous infection.  Objective: Vitals:   10/27/22 1933 10/28/22 0525 10/28/22 0751 10/28/22 1119  BP: (!) 141/56 135/71 (!) 153/71 (!) 146/68  Pulse: 69 78  71  Resp: 16 (!) 23 17 16   Temp: (!) 97.3 F (36.3 C) 97.7 F (36.5 C) (!) 97.5 F (36.4 C) 97.6 F (36.4 C)  TempSrc: Oral Oral Oral Oral  SpO2: 100% 100% 100% 100%  Weight:  73.5 kg    Height:        Intake/Output Summary (Last 24 hours) at 10/28/2022 1341 Last data filed at 10/28/2022 4259 Gross per 24 hour  Intake 906.58 ml  Output 1000 ml  Net -93.42 ml   Filed Weights   10/26/22 1104 10/27/22 0159 10/28/22 0525  Weight: 77.8 kg 74.1 kg 73.5 kg    Examination:  Physical Exam: GEN: NAD, alert and oriented x 3, chronically ill in appearance HEENT: NCAT, PERRL, EOMI, sclera clear, MMM PULM: CTAB w/o  wheezes/crackles, normal respiratory effort, on 2 L nasal cannula CV: RRR w/o M/G/R, noted PICC line and tunneled HD catheter right chest GI: abd soft, NTND, NABS, no R/G/M MSK: no peripheral edema, moves all extremities independently NEURO: CN II-XII intact, no focal deficits, sensation to light touch intact PSYCH: normal mood/affect Integumentary: No concerning rashes/lesions/wounds noted on exposed skin surfaces    Data Reviewed: I have personally reviewed following labs and imaging studies  CBC: Recent Labs  Lab 10/26/22 0313 10/27/22 0527 10/28/22 0355  WBC 12.1* 7.9 7.5  NEUTROABS 10.2*  --   --   HGB 9.6* 8.7* 8.7*  HCT 31.7* 27.5* 27.8*  MCV 94.9 92.9 92.7  PLT 319 272 312   Basic Metabolic Panel: Recent Labs  Lab 10/26/22 0313 10/27/22 0527 10/28/22 0355  NA 132* 131* 134*  K 4.0 3.8 4.1  CL 94* 96* 98  CO2 27 28 28   GLUCOSE 124* 98 112*  BUN 16 8 16   CREATININE 2.13* 1.41* 1.86*  CALCIUM 8.5* 7.9* 8.2*  PHOS  --  2.5 3.0   GFR: Estimated Creatinine Clearance: 25.2 mL/min (A) (by C-G formula based on SCr of 1.86 mg/dL (H)). Liver Function Tests: Recent Labs  Lab 10/26/22 0313 10/27/22 0527 10/28/22 0355  AST 40  --   --   ALT 12  --   --   ALKPHOS 190*  --   --  BILITOT 0.5  --   --   PROT 6.2*  --   --   ALBUMIN 1.9* 1.5* <1.5*   No results for input(s): "LIPASE", "AMYLASE" in the last 168 hours. No results for input(s): "AMMONIA" in the last 168 hours. Coagulation Profile: No results for input(s): "INR", "PROTIME" in the last 168 hours. Cardiac Enzymes: No results for input(s): "CKTOTAL", "CKMB", "CKMBINDEX", "TROPONINI" in the last 168 hours. BNP (last 3 results) No results for input(s): "PROBNP" in the last 8760 hours. HbA1C: No results for input(s): "HGBA1C" in the last 72 hours. CBG: Recent Labs  Lab 10/27/22 1053 10/27/22 1555 10/27/22 2023 10/28/22 0523 10/28/22 1117  GLUCAP 133* 201* 144* 109* 212*   Lipid Profile: No  results for input(s): "CHOL", "HDL", "LDLCALC", "TRIG", "CHOLHDL", "LDLDIRECT" in the last 72 hours. Thyroid Function Tests: Recent Labs    10/26/22 0313  TSH 10.745*  FREET4 1.35*   Anemia Panel: No results for input(s): "VITAMINB12", "FOLATE", "FERRITIN", "TIBC", "IRON", "RETICCTPCT" in the last 72 hours. Sepsis Labs: Recent Labs  Lab 10/26/22 0419 10/26/22 0605 10/26/22 1420  PROCALCITON  --   --  2.05  LATICACIDVEN 0.7 0.7  --     Recent Results (from the past 240 hour(s))  Blood culture (routine x 2)     Status: None (Preliminary result)   Collection Time: 10/26/22  3:41 AM   Specimen: BLOOD  Result Value Ref Range Status   Specimen Description BLOOD BLOOD LEFT WRIST  Final   Special Requests   Final    BOTTLES DRAWN AEROBIC AND ANAEROBIC Blood Culture results may not be optimal due to an excessive volume of blood received in culture bottles   Culture   Final    NO GROWTH 2 DAYS Performed at [facility], [address] [place], [place] [address]    Report Status PENDING  Incomplete  Blood culture (routine x 2)     Status: None (Preliminary result)   Collection Time: 10/26/22  4:19 AM   Specimen: BLOOD  Result Value Ref Range Status   Specimen Description BLOOD BLOOD RIGHT HAND  Final   Special Requests   Final    BOTTLES DRAWN AEROBIC AND ANAEROBIC Blood Culture adequate volume   Culture   Final    NO GROWTH 2 DAYS Performed at [facility], [address] [place], [place] [address]    Report Status PENDING  Incomplete  MRSA Next Gen by PCR, Nasal     Status: None   Collection Time: 10/26/22  9:21 AM   Specimen: Nasal Mucosa; Nasal Swab  Result Value Ref Range Status   MRSA by PCR Next Gen NOT DETECTED NOT DETECTED Final    Comment: (NOTE) The GeneXpert MRSA Assay (FDA approved for NASAL specimens only), is one component of a comprehensive MRSA colonization surveillance program. It is not intended to diagnose MRSA infection nor to guide or  monitor treatment for MRSA infections. Test performance is not FDA approved in patients less than 74 years old. Performed at [facility], [address], [place], [place] [address]   Resp panel by RT-PCR (RSV, Flu A&B, Covid) Anterior Nasal Swab     Status: Abnormal   Collection Time: 10/26/22  9:39 AM   Specimen: Anterior Nasal Swab  Result Value Ref Range Status   SARS Coronavirus 2 by RT PCR POSITIVE (A) NEGATIVE Final   Influenza A by PCR NEGATIVE NEGATIVE Final   Influenza B by PCR NEGATIVE NEGATIVE  Final    Comment: (NOTE) The Xpert Xpress SARS-CoV-2/FLU/RSV plus assay is intended as an aid in the diagnosis of influenza from Nasopharyngeal swab specimens and should not be used as a sole basis for treatment. Nasal washings and aspirates are unacceptable for Xpert Xpress SARS-CoV-2/FLU/RSV testing.  Fact Sheet for Patients: BloggerCourse.com  Fact Sheet for Healthcare Providers: SeriousBroker.it  This test is not yet approved or cleared by the [place] FDA and has been authorized for detection and/or diagnosis of SARS-CoV-2 by FDA under an Emergency Use Authorization (EUA). This EUA will remain in effect (meaning this test can be used) for the duration of the COVID-19 declaration under Section 564(b)(1) of the Act, 21 U.S.C. section 360bbb-3(b)(1), unless the authorization is terminated or revoked.     Resp Syncytial Virus by PCR NEGATIVE NEGATIVE Final    Comment: (NOTE) Fact Sheet for Patients: BloggerCourse.com  Fact Sheet for Healthcare Providers: SeriousBroker.it  This test is not yet approved or cleared by the [place] FDA and has been authorized for detection and/or diagnosis of SARS-CoV-2 by FDA under an Emergency Use Authorization (EUA). This EUA will remain in effect (meaning this test can be used) for the duration of the COVID-19 declaration  under Section 564(b)(1) of the Act, 21 U.S.C. section 360bbb-3(b)(1), unless the authorization is terminated or revoked.  Performed at [facility], [address] [place], [place] [address]          Radiology Studies: No results found.      Scheduled Meds:  amiodarone  200 mg Oral Daily   apixaban  2.5 mg Oral BID   atorvastatin  40 mg Oral QHS   Chlorhexidine Gluconate Cloth  6 each Topical Daily   ezetimibe  10 mg Oral Daily   feeding supplement  237 mL Oral BID BM   furosemide  80 mg Oral Daily   guaiFENesin  600 mg Oral BID   insulin aspart  0-6 Units Subcutaneous TID WC   midodrine  10 mg Oral Q M,W,F-HD   midodrine  5 mg Oral BID WC   multivitamin  1 tablet Oral QHS   nystatin  1 Application Topical BID   pantoprazole  40 mg Oral Daily   polyethylene glycol  17 g Oral Daily   sodium chloride flush  10-40 mL Intracatheter Q12H   sodium chloride flush  3 mL Intravenous Q12H   Continuous Infusions:  meropenem (MERREM) IV Stopped (10/27/22 2200)   remdesivir 100 mg in sodium chloride 0.9 % 100 mL IVPB 100 mg (10/28/22 0923)     LOS: 2 days    Time spent: 51 minutes spent on chart review, discussion with nursing staff, consultants, updating family and interview/physical exam; more than 50% of that time was spent in counseling and/or coordination of care.    Alvira Philips [place], DO [facility] Available via Epic secure chat 7am-7pm After these hours, please refer to coverage provider listed on [email or web address] 10/28/2022, 1:41 PM

## 2022-10-28 NOTE — Evaluation (Signed)
Physical Therapy Evaluation Patient Details Name: [name] [name] MRN: [id number] DOB: October 13, 1940 Today's Date: 10/28/2022  History of Present Illness  Pt is an 82 y/o female admitted to [facility] on 3/26 from [facility] via EMS for confusion and hypoglycemia.  Work up incl acute metabolic encephalopathy likely due to hypoglycemia vs COVID viral infaction.  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  Clinical Impression  Pt admitted with/for the above stated problem.  Pt is presently weak needing min to +2 max assist for OOB activity.  Pt currently limited functionally due to the problems listed. ( See problems list.)   Pt will benefit from PT to maximize function and safety in order to get ready for next venue listed below.         If plan is discharge home, recommend the following: A little help with bathing/dressing/bathroom;A lot of help with walking and/or transfers;Assistance with cooking/housework;Assist for transportation;Help with stairs or ramp for entrance   Can travel by private vehicle   No    Equipment Recommendations Other (comment) (TBD)  Recommendations for Other Services       Functional Status Assessment Patient has had a recent decline in their functional status and demonstrates the ability to make significant improvements in function in a reasonable and predictable amount of time.     Precautions / Restrictions Precautions Precautions: Fall      Mobility  Bed Mobility Overal bed mobility: Needs Assistance Bed Mobility: Sidelying to Sit, Sit to Supine   Sidelying to sit: Min assist, Used rails   Sit to supine: Min assist   General bed mobility comments: moving slowly with need for truncal assist up and LE assist back to supine.  Pt needed assist and the bed  functions to get boosted up in the bed.    Transfers Overall transfer level: Needs assistance   Transfers: Sit to/from Stand Sit to Stand: Mod assist, From elevated surface           General transfer comment: cues for hand placement and assist forward and with boost    Ambulation/Gait Ambulation/Gait assistance: Mod assist, Max assist Gait Distance (Feet): 2 Feet (steps forward and back x2 with buckle of L LE on second attempt.) Assistive device: Rolling walker (2 wheels) Gait Pattern/deviations: Step-to pattern   Gait velocity interpretation: <1.31 ft/sec, indicative of household ambulator   General Gait Details: weak unsteady steps.  Stairs            Wheelchair Mobility     Tilt Bed    Modified Rankin (Stroke Patients Only)       Balance Overall balance assessment: Needs assistance Sitting-balance support: Single extremity supported, No upper extremity supported, Feet supported Sitting balance-Leahy Scale: Fair     Standing balance support: Bilateral upper extremity supported, During functional activity Standing balance-Leahy Scale: Poor                               Pertinent Vitals/Pain Pain Assessment Pain Assessment: Faces Faces Pain Scale: No hurt Pain Intervention(s): Monitored during session    Home Living Family/patient expects to be discharged to:: Skilled nursing facility Living Arrangements: Spouse/significant other                 Additional Comments: lives with  spouse and children, grandchildren, etc    Prior Function Prior Level of Function : Needs assist             Mobility Comments: pt has been unable to walk lately, but prior ambulation with rollator and guard assist. ADLs Comments: assist with bathing/dressing     Extremity/Trunk Assessment   Upper Extremity Assessment Upper Extremity Assessment: Generalized weakness    Lower Extremity Assessment Lower Extremity Assessment: Generalized  weakness    Cervical / Trunk Assessment Cervical / Trunk Assessment: Kyphotic  Communication   Communication Communication: No apparent difficulties  Cognition Arousal: Alert Behavior During Therapy: WFL for tasks assessed/performed Overall Cognitive Status:  (NT formally, but is confused and talking with context not in the hospital environment)                                          General Comments General comments (skin integrity, edema, etc.): Sats on RA upper 90's, HR in the 90's bpm.    Exercises Other Exercises Other Exercises: warm up hip/knee flexion/ext ROM with graded resistance x 10 reps.   Assessment/Plan    PT Assessment Patient needs continued PT services  PT Problem List Decreased strength;Decreased activity tolerance;Decreased balance;Decreased mobility;Cardiopulmonary status limiting activity       PT Treatment Interventions Gait training;Functional mobility training;Therapeutic activities;Balance training;Patient/family education;DME instruction    PT Goals (Current goals can be found in the Care Plan section)  Acute Rehab PT Goals Patient Stated Goal: get walking PT Goal Formulation: With patient Time For Goal Achievement: 11/11/22 Potential to Achieve Goals: Good    Frequency Min 1X/week     Co-evaluation               AM-PAC PT "6 Clicks" Mobility  Outcome Measure Help needed turning from your back to your side while in a flat bed without using bedrails?: A Little Help needed moving from lying on your back to sitting on the side of a flat bed without using bedrails?: A Little Help needed moving to and from a bed to a chair (including a wheelchair)?: A Lot Help needed standing up from a chair using your arms (e.g., wheelchair or bedside chair)?: Total Help needed to walk in hospital room?: Total Help needed climbing 3-5 steps with a railing? : Total 6 Click Score: 11    End of Session Equipment Utilized During  Treatment: Gait belt Activity Tolerance: Patient tolerated treatment well;Patient limited by fatigue Patient left: in bed;with call bell/phone within reach;with bed alarm set Nurse Communication: Mobility status PT Visit Diagnosis: Muscle weakness (generalized) (M62.81);Other abnormalities of gait and mobility (R26.89);Difficulty in walking, not elsewhere classified (R26.2)    Time: 1610-9604 PT Time Calculation (min) (ACUTE ONLY): 19 min   Charges:   PT Evaluation $PT Eval Moderate Complexity: 1 Mod   PT General Charges $$ ACUTE PT VISIT: 1 Visit         10/28/2022  Jacinto Halim., PT Acute Rehabilitation Services [phone]  (office)  Eliseo Gum Sherl Yzaguirre 10/28/2022, 7:34 PM

## 2022-10-28 NOTE — Consult Note (Signed)
   [facility] CM Inpatient Consult   10/28/2022  KALESHA MORNING 10/18/1940 [id number]  [facility] [[facility]]  Accountable Care Organization [ACO] Patient: [facility] ACO REACH  Primary Care Provider:  [name], MD with [facility] is listed to provide the post hospital/facility TOC follow up  Patient was reviewed for less than 30 days readmission with 6 admissions in 6 months extreme high risk score for ongoing barriers to care.  Patient admitted from SNF with COVID -19 and ESLB bacteremia with confusion and hypothermia. Patient with HD MWF.  Preserved PPE for hospital team for rounds.  Patient was screened for hospitalization and on behalf of [facility] Care Coordination to assess for post hospital community care needs.  Patient is being considered to return to skilled nursing facility level of care for post hospital transition when medically ready.  If the patient goes to a [facility] affiliated facility then, patient can be followed by [facility] RN with traditional Medicare and approved Medicare Advantage plans.    Plan:   Will notify the Community [facility] RN can follow for any known or needs for transitional care needs for returning to post facility care coordination needs to return to community.  For questions or referrals, please contact:   Charlesetta Shanks, RN BSN CCM [facility]  [phone] business mobile phone Toll free office [phone]  Fax number: [phone] [email or web address]

## 2022-10-28 NOTE — Progress Notes (Signed)
[facility] Progress Note  Name: [name] [name] MRN: [id number] DOB: 09-Apr-1940  Chief Complaint:  Altered mental status   Subjective:  Last HD on 8/26 with 2 kg UF.  She had 1.2 liters UOP over 8/27 charted as well as one unmeasured urine void.  She feels ok today.  She has been conversant and alert per nursing.    Review of systems:    Denies shortness of breath  Denies chest pain  Denies n/v   ------------  Background on consult:  [name] [name] is a 82 y.o. female with ?AKI v. ESRD, HTN, HL, T2DM, CAD, A-fib on Eliquis who was admitted with AMS.   Evaluated in ED - at that time, she was not really responsive. Husband was at bedside and provided history. Was in her usual state of (frail) health yesterday. He was called by SNF this AM that she was being transported to ED for non-responsiveness. He denies any new complaints over the past few days. No new medications at he is aware of. In the ED, she was extremely hypothermic - T91.10F - and she was hypoxic. Sepsis protocol activated. She was placed under a warming blanket, pan-cultured. Labs showed Na 132, K 4, CO2 27, BUN 16, Cr 2.13, Alb 1.9, Trop 11 -> 10, WBC 12.1, Hgb 9.6, Plts 319. Flu negative, but COVID POSITIVE. Head CT without acute CVA. CXR with ^ vascular congestion.  Has been on dialysis since 08/2022 - considered "AKI" by her outpatient unit. Baseline Cr was 2's - up to 4.5 range with uremia and overload, started HD at that time. Reviewing outpatient records - losing weight and dry weight is being lowered, ?assuming doesn't make much urine. Her muscle mass is very low so Cr can be deceivingly low. Should have finished a course of Ertapenem for Klebsiella bacteremia yesterday.   Intake/Output Summary (Last 24 hours) at 10/28/2022 1212 Last data filed at 10/28/2022 4132 Gross per 24 hour  Intake 906.58 ml  Output 1000 ml  Net -93.42 ml    Vitals:  Vitals:   10/27/22 1933 10/28/22 0525 10/28/22 0751  10/28/22 1119  BP: (!) 141/56 135/71 (!) 153/71 (!) 146/68  Pulse: 69 78  71  Resp: 16 (!) 23 17 16   Temp: (!) 97.3 F (36.3 C) 97.7 F (36.5 C) (!) 97.5 F (36.4 C) 97.6 F (36.4 C)  TempSrc: Oral Oral Oral Oral  SpO2: 100% 100% 100% 100%  Weight:  73.5 kg    Height:         Physical Exam:    General adult female in bed in no acute distress HEENT normocephalic atraumatic extraocular movements intact sclera anicteric Neck supple trachea midline Lungs clear to auscultation bilaterally normal work of breathing at rest on room air  Heart S1S2 no rub Abdomen soft nontender nondistended Extremities 1+ edema lower extremities Psych normal mood and affect Neuro - alert and oriented x 3 provides hx and follows commands; more conversant and alert today  Access RIJ tunn catheter in place   Medications reviewed   Labs:     Latest Ref Rng & Units 10/28/2022    3:55 AM 10/27/2022    5:27 AM 10/26/2022    3:13 AM  BMP  Glucose 70 - 99 mg/dL 440  98  102   BUN 8 - 23 mg/dL 16  8  16    Creatinine 0.44 - 1.00 mg/dL 7.25  3.66  4.40   Sodium 135 - 145 mmol/L 134  131  132   Potassium 3.5 - 5.1 mmol/L 4.1  3.8  4.0   Chloride 98 - 111 mmol/L 98  96  94   CO2 22 - 32 mmol/L 28  28  27    Calcium 8.9 - 10.3 mg/dL 8.2  7.9  8.5    Dialysis Orders:  MWF at [facility] 3:30hr, 400/600, EDW 74.1kg, 3K/2.5Ca, TDC, no heparin - Last HD 8/23, left at 73.6kg - Mircera IV q 2 weeks - last given 7/29   Assessment/Plan:   # AMS/encephalopathy  - may be secondary to covid   - improving per charting  - work-up per primary team    # Covid 19 pna - therapies per primary team - on remdesivir   # Dialysis-dependent AKI - Had been on dialysis per MWF schedule.  Now nonoliguric and she appears to have evidence of renal recovery.  - no acute indication for dialysis today.  Assess dialysis needs daily while here  - Lasix 80 mg PO daily for now    # HTN  - optimize volume with HD as able    #  Anemia of CKD  - received aranesp 100 mcg on 10/27/22   # Metabolic bone disease  - phos acceptable - aki patient     # Atrial fibrillation  - rate controlling agents per primary team   Disposition - May be discharged as early as Thursday per primary team from their standpoint.  We are monitoring for evidence of renal recovery  Estanislado Emms, MD 10/28/2022 12:37 PM

## 2022-10-29 ENCOUNTER — Inpatient Hospital Stay (HOSPITAL_COMMUNITY): Payer: Medicare Other

## 2022-10-29 ENCOUNTER — Ambulatory Visit: Payer: Medicare Other | Admitting: Internal Medicine

## 2022-10-29 DIAGNOSIS — U071 COVID-19: Secondary | ICD-10-CM | POA: Diagnosis not present

## 2022-10-29 HISTORY — PX: IR REMOVAL TUN CV CATH W/O FL: IMG2289

## 2022-10-29 LAB — RENAL FUNCTION PANEL
Albumin: 1.5 g/dL — ABNORMAL LOW (ref 3.5–5.0)
Anion gap: 12 (ref 5–15)
BUN: 26 mg/dL — ABNORMAL HIGH (ref 8–23)
CO2: 26 mmol/L (ref 22–32)
Calcium: 8.3 mg/dL — ABNORMAL LOW (ref 8.9–10.3)
Chloride: 93 mmol/L — ABNORMAL LOW (ref 98–111)
Creatinine, Ser: 2.3 mg/dL — ABNORMAL HIGH (ref 0.44–1.00)
GFR, Estimated: 21 mL/min — ABNORMAL LOW (ref >60)
Glucose, Bld: 113 mg/dL — ABNORMAL HIGH (ref 70–99)
Phosphorus: 3.5 mg/dL (ref 2.5–4.6)
Potassium: 4.3 mmol/L (ref 3.5–5.1)
Sodium: 131 mmol/L — ABNORMAL LOW (ref 135–145)

## 2022-10-29 LAB — GLUCOSE, CAPILLARY
Glucose-Capillary: 108 mg/dL — ABNORMAL HIGH (ref 70–99)
Glucose-Capillary: 239 mg/dL — ABNORMAL HIGH (ref 70–99)
Glucose-Capillary: 239 mg/dL — ABNORMAL HIGH (ref 70–99)
Glucose-Capillary: 117 mg/dL — ABNORMAL HIGH (ref 70–99)

## 2022-10-29 MED ORDER — FUROSEMIDE 40 MG PO TABS: 80.0000 mg | ORAL_TABLET | ORAL | Status: DC

## 2022-10-29 MED ORDER — LIDOCAINE HCL 1 % IJ SOLN: 20.0000 mL | Freq: Once | INTRAMUSCULAR | Status: DC

## 2022-10-29 NOTE — Care Management Important Message (Signed)
Important Message  Patient Details  Name: [name] [name] MRN: [id number] Date of Birth: June 16, 1940   Medicare Important Message Given:  Yes     Renie Ora 10/29/2022, 8:11 AM

## 2022-10-29 NOTE — Progress Notes (Signed)
PROGRESS NOTE    [name] [name]  NWG:[id number] DOB: 1940/06/24 DOA: 10/26/2022 PCP: [name], MD    Brief Narrative:   [name] [name] is a 82 y.o. 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  Recently hospitalized 8/7 - 8/17 after having suspected cardiac arrest with TTE showing LVEF 25%.  Patient was found bacteremic with ESBL Klebsiella during that hospitalization in which infectious disease was consulted and recommended to complete 2 weeks of ertapenem.  Per EMS run sheet, patient has been having frequent glucose drops at SNF and had been given glucagon.  Also if she is becoming more hypoxic requiring nasal cannula.  In the ED, temperature 92.9 degrees Fahrenheit rectally, HR 58, RR 19, BP 142/58, SpO2 100% on 4 L nasal cannula.  WBC 12.1, hemoglobin 9.6, platelet count 319.  Sodium 132, potassium 4.0, chloride 94, CO2 27, glucose 124, BUN 16, creatinine 2.13.  AST 40, ALT 12, total bilirubin 0.5.  BNP 1951.8.  High sensitive troponin 11 followed by 10.  Lactic acid 0.7.  TSH 10.745, free T4 1.35.  Urinalysis with moderate leukocytes, negative nitrite, rare bacteria, 0-5 WBCs.  Cova-19 PCR positive.  Influenza/RSV PCR negative.  Assessment & Plan:   Acute metabolic encephalopathy likely secondary to hypoglycemia versus COVID viral infection Patient reportedly more confused at SNF, likely combination of acute COVID viral infection as well as hypoglycemia.  CT head without contrast with no acute finding, noted aging brain and extensive left temporal encephalomalacia.  Patient appears to be now back at her  typical baseline.  Hypoglycemia Hx type 2 diabetes mellitus Patient with reported episodes of hypoglycemia at SNF.  Current home regimen includes NPH 10 units before breakfast and 5 units before dinner.  Hemoglobin A1c 9.7 on 09/06/2022, poorly controlled. -- Holding home NPH; likely will need to discontinue vs change to only a long-acting insulin on discharge -- SSI for coverage -- CBGs before every meal/at bedtime  COVID viral infection COVID PCR positive on admission. -- Continue remdesivir to complete 3-day course -- Continue contact/airborne isolation precautions  History of ESBL Klebsiella bacteremia Patient recently hospitalized and currently on IV antibiotics for ESBL Klebsiella bacteremia.  Initially diagnosed on 10/07/2022, underwent line holiday with repeat blood cultures on 8/13 that were negative.  Per infectious disease, patient to complete 2-week course of IV antibiotics which would end on 10/25/2022.  Patient had follow-up scheduled with ID, Dr. Algis Liming on 8/26 but due to this hospitalization has missed that appointment.  Discussed with ID, given this hospitalization is unrelated to bacteremia no change in plan and okay to discontinue IV antibiotics. -- IR for PICC line removal today  Advanced CKD versus ESRD on HD Typically dialyzes outpatient at [facility] on MWF schedule. -- Nephrology following, appreciate assistance -- HD planned today  Chronic hypotension -- Midodrine 5 mg p.o. twice daily and 10 mg on dialysis days  Chronic systolic congestive heart failure Unable to tolerate GDMT due to chronic hypotension BNP elevated on admission. --Continue fluid management per HD; further plan today  Paroxysmal atrial fibrillation -- Continue Eliquis  Anemia of chronic medical/renal disease Hemoglobin 8.7, stable.  Abnormal thyroid studies TSH 10.745  and free T4 1.35.  Patient recently started on amiodarone. -- recheck thyroid studies in a few weeks.  GERD -- Protonix 40  mg   DVT prophylaxis: apixaban (ELIQUIS) tablet 2.5 mg Start: 10/26/22 1000 apixaban (ELIQUIS) tablet 2.5 mg    Code Status: DNR Family Communication: No family present at bedside this morning  Disposition Plan:  Level of care: Telemetry Medical Status is: Inpatient Remains inpatient appropriate because: IV remdesivir, anticipate likely discharge back to SNF tomorrow once completes remdesivir, PICC line removed and needs HD today and likely tomorrow to get back on her normal schedule    Consultants:  Nephrology Palliative care  Procedures:  None  Antimicrobials:  Vancomycin 8/25 - 8/26 Meropenem 8/25 - 8/27 Cefepime 8/25 - 8/25   Subjective: Patient seen examined bedside, resting calmly.  Lying in bed.  No specific questions or concerns.  Will complete IV remdesivir today.  Needs IR for PICC line removal and now off normal dialysis schedule with plan HD today and likely tomorrow before being able to go back to SNF.  Discussed with SW.   Denies headache, no dizziness, no chest pain, no shortness of breath, no fever/chills, no nausea/vomiting/diarrhea, no focal weakness, no fatigue, no paresthesias.  No acute events overnight per nursing staff.     Objective: Vitals:   10/29/22 0557 10/29/22 0750 10/29/22 0904 10/29/22 1104  BP: (!) 128/57 (!) 145/72  (!) 129/50  Pulse: 62 63  63  Resp: 17 17  20   Temp: 97.6 F (36.4 C)  (!) 94.8 F (34.9 C) (!) 94.8 F (34.9 C)  TempSrc: Oral  Rectal Rectal  SpO2: 97% 100%  97%  Weight:      Height:        Intake/Output Summary (Last 24 hours) at 10/29/2022 1108 Last data filed at 10/29/2022 0558 Gross per 24 hour  Intake 580 ml  Output 1100 ml  Net -520 ml   Filed Weights   10/27/22 0159 10/28/22 0525 10/29/22 0556  Weight: 74.1 kg 73.5 kg 70.9 kg    Examination:  Physical Exam: GEN: NAD, alert and oriented x 3, chronically ill in appearance HEENT: NCAT, PERRL, EOMI, sclera clear, MMM PULM: CTAB w/o wheezes/crackles,  normal respiratory effort, on room air CV: RRR w/o M/G/R, noted PICC line and tunneled HD catheter right chest GI: abd soft, NTND, NABS, no R/G/M MSK: no peripheral edema, moves all extremities independently NEURO: CN II-XII intact, no focal deficits, sensation to light touch intact PSYCH: normal mood/affect Integumentary: No concerning rashes/lesions/wounds noted on exposed skin surfaces    Data Reviewed: I have personally reviewed following labs and imaging studies  CBC: Recent Labs  Lab 10/26/22 0313 10/27/22 0527 10/28/22 0355  WBC 12.1* 7.9 7.5  NEUTROABS 10.2*  --   --   HGB 9.6* 8.7* 8.7*  HCT 31.7* 27.5* 27.8*  MCV 94.9 92.9 92.7  PLT 319 272 312   Basic Metabolic Panel: Recent Labs  Lab 10/26/22 0313 10/27/22 0527 10/28/22 0355 10/29/22 0321  NA 132* 131* 134* 131*  K 4.0 3.8 4.1 4.3  CL 94* 96* 98 93*  CO2 27 28 28 26   GLUCOSE 124* 98 112* 113*  BUN 16 8 16  26*  CREATININE 2.13* 1.41* 1.86* 2.30*  CALCIUM 8.5* 7.9* 8.2* 8.3*  PHOS  --  2.5 3.0 3.5   GFR: Estimated Creatinine Clearance: 20.4 mL/min (A) (by C-G formula based on SCr of 2.3 mg/dL (H)). Liver Function Tests: Recent Labs  Lab 10/26/22 [phone] 10/27/22 541 092 2501  10/28/22 0355 10/29/22 0321  AST 40  --   --   --   ALT 12  --   --   --   ALKPHOS 190*  --   --   --   BILITOT 0.5  --   --   --   PROT 6.2*  --   --   --   ALBUMIN 1.9* 1.5* <1.5* 1.5*   No results for input(s): "LIPASE", "AMYLASE" in the last 168 hours. No results for input(s): "AMMONIA" in the last 168 hours. Coagulation Profile: No results for input(s): "INR", "PROTIME" in the last 168 hours. Cardiac Enzymes: No results for input(s): "CKTOTAL", "CKMB", "CKMBINDEX", "TROPONINI" in the last 168 hours. BNP (last 3 results) No results for input(s): "PROBNP" in the last 8760 hours. HbA1C: No results for input(s): "HGBA1C" in the last 72 hours. CBG: Recent Labs  Lab 10/28/22 1117 10/28/22 1614 10/28/22 2026 10/29/22 0600  10/29/22 1050  GLUCAP 212* 171* 173* 108* 239*   Lipid Profile: No results for input(s): "CHOL", "HDL", "LDLCALC", "TRIG", "CHOLHDL", "LDLDIRECT" in the last 72 hours. Thyroid Function Tests: No results for input(s): "TSH", "T4TOTAL", "FREET4", "T3FREE", "THYROIDAB" in the last 72 hours.  Anemia Panel: No results for input(s): "VITAMINB12", "FOLATE", "FERRITIN", "TIBC", "IRON", "RETICCTPCT" in the last 72 hours. Sepsis Labs: Recent Labs  Lab 10/26/22 0419 10/26/22 0605 10/26/22 1420  PROCALCITON  --   --  2.05  LATICACIDVEN 0.7 0.7  --     Recent Results (from the past 240 hour(s))  Blood culture (routine x 2)     Status: None (Preliminary result)   Collection Time: 10/26/22  3:41 AM   Specimen: BLOOD  Result Value Ref Range Status   Specimen Description BLOOD BLOOD LEFT WRIST  Final   Special Requests   Final    BOTTLES DRAWN AEROBIC AND ANAEROBIC Blood Culture results may not be optimal due to an excessive volume of blood received in culture bottles   Culture   Final    NO GROWTH 3 DAYS Performed at [facility], [address], [place], [place] [address]    Report Status PENDING  Incomplete  Blood culture (routine x 2)     Status: None (Preliminary result)   Collection Time: 10/26/22  4:19 AM   Specimen: BLOOD  Result Value Ref Range Status   Specimen Description BLOOD BLOOD RIGHT HAND  Final   Special Requests   Final    BOTTLES DRAWN AEROBIC AND ANAEROBIC Blood Culture adequate volume   Culture   Final    NO GROWTH 3 DAYS Performed at [facility], [address], [place], [place] [address]    Report Status PENDING  Incomplete  MRSA Next Gen by PCR, Nasal     Status: None   Collection Time: 10/26/22  9:21 AM   Specimen: Nasal Mucosa; Nasal Swab  Result Value Ref Range Status   MRSA by PCR Next Gen NOT DETECTED NOT DETECTED Final    Comment: (NOTE) The GeneXpert MRSA Assay (FDA approved for NASAL specimens only), is one component of a comprehensive  MRSA colonization surveillance program. It is not intended to diagnose MRSA infection nor to guide or monitor treatment for MRSA infections. Test performance is not FDA approved in patients less than 61 years old. Performed at [facility], [address], [place], [place] [address]   Resp panel by RT-PCR (RSV, Flu A&B, Covid) Anterior Nasal Swab     Status: Abnormal  Collection Time: 10/26/22  9:39 AM   Specimen: Anterior Nasal Swab  Result Value Ref Range Status   SARS Coronavirus 2 by RT PCR POSITIVE (A) NEGATIVE Final   Influenza A by PCR NEGATIVE NEGATIVE Final   Influenza B by PCR NEGATIVE NEGATIVE Final    Comment: (NOTE) The Xpert Xpress SARS-CoV-2/FLU/RSV plus assay is intended as an aid in the diagnosis of influenza from Nasopharyngeal swab specimens and should not be used as a sole basis for treatment. Nasal washings and aspirates are unacceptable for Xpert Xpress SARS-CoV-2/FLU/RSV testing.  Fact Sheet for Patients: BloggerCourse.com  Fact Sheet for Healthcare Providers: SeriousBroker.it  This test is not yet approved or cleared by the [place] FDA and has been authorized for detection and/or diagnosis of SARS-CoV-2 by FDA under an Emergency Use Authorization (EUA). This EUA will remain in effect (meaning this test can be used) for the duration of the COVID-19 declaration under Section 564(b)(1) of the Act, 21 U.S.C. section 360bbb-3(b)(1), unless the authorization is terminated or revoked.     Resp Syncytial Virus by PCR NEGATIVE NEGATIVE Final    Comment: (NOTE) Fact Sheet for Patients: BloggerCourse.com  Fact Sheet for Healthcare Providers: SeriousBroker.it  This test is not yet approved or cleared by the [place] FDA and has been authorized for detection and/or diagnosis of SARS-CoV-2 by FDA under an Emergency Use Authorization (EUA). This  EUA will remain in effect (meaning this test can be used) for the duration of the COVID-19 declaration under Section 564(b)(1) of the Act, 21 U.S.C. section 360bbb-3(b)(1), unless the authorization is terminated or revoked.  Performed at [facility], [address] [place], [place] [address]          Radiology Studies: No results found.      Scheduled Meds:  amiodarone  200 mg Oral Daily   apixaban  2.5 mg Oral BID   atorvastatin  40 mg Oral QHS   Chlorhexidine Gluconate Cloth  6 each Topical Daily   ezetimibe  10 mg Oral Daily   feeding supplement  237 mL Oral BID BM   furosemide  80 mg Oral Daily   guaiFENesin  600 mg Oral BID   insulin aspart  0-6 Units Subcutaneous TID WC   midodrine  10 mg Oral Q M,W,F-HD   midodrine  5 mg Oral BID WC   multivitamin  1 tablet Oral QHS   nystatin  1 Application Topical BID   pantoprazole  40 mg Oral Daily   polyethylene glycol  17 g Oral Daily   sodium chloride flush  10-40 mL Intracatheter Q12H   sodium chloride flush  3 mL Intravenous Q12H   Continuous Infusions:  remdesivir 100 mg in sodium chloride 0.9 % 100 mL IVPB 100 mg (10/29/22 1052)     LOS: 3 days    Time spent: 48 minutes spent on chart review, discussion with nursing staff, consultants, updating family and interview/physical exam; more than 50% of that time was spent in counseling and/or coordination of care.    Alvira Philips [place], DO [facility] Available via Epic secure chat 7am-7pm After these hours, please refer to coverage provider listed on [email or web address] 10/29/2022, 11:08 AM

## 2022-10-29 NOTE — TOC Progression Note (Addendum)
Transition of Care [facility]) - Progression Note    Patient Details  Name: [name] [name] MRN: [id number] Date of Birth: 1940-04-05  Transition of Care [facility]) CM/SW Contact  Leander Rams, LCSW Phone Number: 10/29/2022, 11:43 AM  Clinical Narrative:    CSW was informed by MD that pt missed HD yesterday and is not medically ready to leave today. Will aim to dc back to facility tomorrow. [facility] confirms pt can admit.   TOC will continue to follow.    Expected Discharge Plan: Skilled Nursing Facility Barriers to Discharge: Continued Medical Work up  Expected Discharge Plan and Services In-house Referral: Clinical Social Work     Living arrangements for the past 2 months: Skilled Nursing Facility Expected Discharge Date: 11/01/22                                     Social Determinants of Health (SDOH) Interventions SDOH Screenings   Food Insecurity: No Food Insecurity (10/26/2022)  Housing: Low Risk  (10/26/2022)  Transportation Needs: No Transportation Needs (10/26/2022)  Utilities: Not At Risk (10/26/2022)  Alcohol Screen: Low Risk  (08/22/2020)  Depression (PHQ2-9): Low Risk  (08/21/2022)  Financial Resource Strain: Low Risk  (08/22/2020)  Physical Activity: Inactive (08/22/2020)  Social Connections: Moderately Integrated (08/22/2020)  Stress: No Stress Concern Present (08/22/2020)  Tobacco Use: Medium Risk (10/26/2022)    Readmission Risk Interventions    10/26/2022    1:22 PM 08/06/2022    3:31 PM 07/23/2022   12:43 PM  Readmission Risk Prevention Plan  Transportation Screening Complete Complete Complete  PCP or Specialist Appt within 5-7 Days   Complete  PCP or Specialist Appt within 3-5 Days  Complete   Home Care Screening   Complete  Medication Review (RN CM)   Referral to Pharmacy  HRI or Home Care Consult  Complete   Social Work Consult for Recovery Care Planning/Counseling  --   Palliative Care Screening  Not Applicable   Medication Review Oceanographer)  Complete Complete   PCP or Specialist appointment within 3-5 days of discharge Complete    HRI or Home Care Consult Complete    SW Recovery Care/Counseling Consult Complete    Palliative Care Screening Not Applicable    Skilled Nursing Facility Complete     Oletta Lamas, MSW, [place], LCASA Transitions of Care  Clinical Social Worker I

## 2022-10-29 NOTE — Progress Notes (Signed)
Successful removal of tunneled (R)internal jugular CVC without complication. Tunneled HD cath remains.  Brayton El PA-C Interventional Radiology 10/29/2022 2:32 PM

## 2022-10-29 NOTE — Progress Notes (Signed)
[facility] Progress Note  Name: [name] [name] MRN: [id number] DOB: 04-24-1940  Chief Complaint:  Altered mental status   Subjective:  Last HD on 8/26 with 2 kg UF.  She had 1.1 liters UOP over 8/28 charted.  She feels ok.  She isn't sure of discharge plans- was told maybe today.      Review of systems:     Denies shortness of breath  Denies chest pain  Denies n/v   ------------  Background on consult:  [name] [name] is a 82 y.o. female with ?AKI v. ESRD, HTN, HL, T2DM, CAD, A-fib on Eliquis who was admitted with AMS.   Evaluated in ED - at that time, she was not really responsive. Husband was at bedside and provided history. Was in her usual state of (frail) health yesterday. He was called by SNF this AM that she was being transported to ED for non-responsiveness. He denies any new complaints over the past few days. No new medications at he is aware of. In the ED, she was extremely hypothermic - T91.52F - and she was hypoxic. Sepsis protocol activated. She was placed under a warming blanket, pan-cultured. Labs showed Na 132, K 4, CO2 27, BUN 16, Cr 2.13, Alb 1.9, Trop 11 -> 10, WBC 12.1, Hgb 9.6, Plts 319. Flu negative, but COVID POSITIVE. Head CT without acute CVA. CXR with ^ vascular congestion.  Has been on dialysis since 08/2022 - considered "AKI" by her outpatient unit. Baseline Cr was 2's - up to 4.5 range with uremia and overload, started HD at that time. Reviewing outpatient records - losing weight and dry weight is being lowered, ?assuming doesn't make much urine. Her muscle mass is very low so Cr can be deceivingly low. Should have finished a course of Ertapenem for Klebsiella bacteremia yesterday.   Intake/Output Summary (Last 24 hours) at 10/29/2022 1118 Last data filed at 10/29/2022 0558 Gross per 24 hour  Intake 580 ml  Output 1100 ml  Net -520 ml    Vitals:  Vitals:   10/29/22 0557 10/29/22 0750 10/29/22 0904 10/29/22 1104  BP: (!) 128/57 (!)  145/72  (!) 129/50  Pulse: 62 63  63  Resp: 17 17  20   Temp: 97.6 F (36.4 C)  (!) 94.8 F (34.9 C) (!) 94.8 F (34.9 C)  TempSrc: Oral  Rectal Rectal  SpO2: 97% 100%  97%  Weight:      Height:         Physical Exam:      General adult female in bed in no acute distress HEENT normocephalic atraumatic extraocular movements intact sclera anicteric Neck supple trachea midline Lungs clear to auscultation bilaterally normal work of breathing at rest on room air  Heart S1S2 no rub Abdomen soft nontender nondistended Extremities 1-2+ edema lower extremities Psych normal mood and affect Neuro - alert and oriented x 3 provides hx and follows commands; conversant Access RIJ tunn catheter in place   Medications reviewed   Labs:     Latest Ref Rng & Units 10/29/2022    3:21 AM 10/28/2022    3:55 AM 10/27/2022    5:27 AM  BMP  Glucose 70 - 99 mg/dL 409  811  98   BUN 8 - 23 mg/dL 26  16  8    Creatinine 0.44 - 1.00 mg/dL 9.14  7.82  9.56   Sodium 135 - 145 mmol/L 131  134  131   Potassium 3.5 - 5.1 mmol/L 4.3  4.1  3.8   Chloride 98 - 111 mmol/L 93  98  96   CO2 22 - 32 mmol/L 26  28  28    Calcium 8.9 - 10.3 mg/dL 8.3  8.2  7.9    Dialysis Orders:  MWF at [facility] 3:30hr, 400/600, EDW 74.1kg, 3K/2.5Ca, TDC, no heparin - Last HD 8/23, left at 73.6kg - Mircera IV q 2 weeks - last given 7/29   Assessment/Plan:   # AMS/encephalopathy  - may be secondary to covid   - improving per charting  - work-up per primary team    # Covid 19 pna - therapies per primary team - on remdesivir   # Dialysis-dependent AKI - Had been on dialysis per MWF schedule.  Nonoliguric and she appears to have some evidence of renal recovery; however, she does have continued rise in creatinine in between HD sessions and presented with overload despite HD.  - For now continue with HD - we will monitor for renal recovery outpatient. Next HD tomorrow - likely outpatient.  I have reached out to HD SW to see  if we can set this up  - I have added Lasix 80 mg PO daily on non-dialysis days and would continue this on discharge - Continue to monitor for renal recovery - For now continue with outpatient dialysis    # HTN  - optimize volume with HD as able    # Anemia of CKD  - received aranesp 100 mcg on 10/27/22   # Metabolic bone disease  - phos acceptable - aki patient     # Atrial fibrillation  - rate controlling agents per primary team   Disposition - Per primary team.  Acceptable for discharge from a strictly renal standpoint.  We will continue monitoring for renal recovery as an outpatient   Estanislado Emms, MD 10/29/2022 11:49 AM

## 2022-10-30 ENCOUNTER — Ambulatory Visit (HOSPITAL_BASED_OUTPATIENT_CLINIC_OR_DEPARTMENT_OTHER): Payer: Medicare Other | Admitting: Family

## 2022-10-30 DIAGNOSIS — U071 COVID-19: Secondary | ICD-10-CM | POA: Diagnosis not present

## 2022-10-30 LAB — CBC
HCT: 34 % — ABNORMAL LOW (ref 36.0–46.0)
Hemoglobin: 10.9 g/dL — ABNORMAL LOW (ref 12.0–15.0)
MCH: 28.8 pg (ref 26.0–34.0)
MCHC: 32.1 g/dL (ref 30.0–36.0)
MCV: 89.7 fL (ref 80.0–100.0)
Platelets: 404 10*3/uL — ABNORMAL HIGH (ref 150–400)
RBC: 3.79 MIL/uL — ABNORMAL LOW (ref 3.87–5.11)
RDW: 19.1 % — ABNORMAL HIGH (ref 11.5–15.5)
WBC: 8.4 10*3/uL (ref 4.0–10.5)
nRBC: 0 % (ref 0.0–0.2)

## 2022-10-30 LAB — GLUCOSE, CAPILLARY
Glucose-Capillary: 86 mg/dL (ref 70–99)
Glucose-Capillary: 140 mg/dL — ABNORMAL HIGH (ref 70–99)
Glucose-Capillary: 148 mg/dL — ABNORMAL HIGH (ref 70–99)
Glucose-Capillary: 186 mg/dL — ABNORMAL HIGH (ref 70–99)

## 2022-10-30 LAB — RENAL FUNCTION PANEL
Albumin: 1.6 g/dL — ABNORMAL LOW (ref 3.5–5.0)
Anion gap: 10 (ref 5–15)
BUN: 38 mg/dL — ABNORMAL HIGH (ref 8–23)
CO2: 29 mmol/L (ref 22–32)
Calcium: 8.6 mg/dL — ABNORMAL LOW (ref 8.9–10.3)
Chloride: 94 mmol/L — ABNORMAL LOW (ref 98–111)
Creatinine, Ser: 2.76 mg/dL — ABNORMAL HIGH (ref 0.44–1.00)
GFR, Estimated: 17 mL/min — ABNORMAL LOW (ref >60)
Glucose, Bld: 93 mg/dL (ref 70–99)
Phosphorus: 3.8 mg/dL (ref 2.5–4.6)
Potassium: 4.9 mmol/L (ref 3.5–5.1)
Sodium: 133 mmol/L — ABNORMAL LOW (ref 135–145)

## 2022-10-30 MED ORDER — INSULIN GLARGINE 100 UNIT/ML SOLOSTAR PEN: 5.0000 [IU] | PEN_INJECTOR | Freq: Every day | SUBCUTANEOUS | 11 refills | Status: DC

## 2022-10-30 MED ORDER — CHLORHEXIDINE GLUCONATE CLOTH 2 % EX PADS
6.0000 | MEDICATED_PAD | Freq: Every day | CUTANEOUS | Status: DC
  Administered 2022-10-30: 6 via TOPICAL

## 2022-10-30 MED ORDER — PENTAFLUOROPROP-TETRAFLUOROETH EX AERO: 1.0000 | INHALATION_SPRAY | CUTANEOUS | Status: DC | PRN

## 2022-10-30 MED ORDER — HEPARIN SODIUM (PORCINE) 1000 UNIT/ML DIALYSIS: 1000.0000 [IU] | INTRAMUSCULAR | Status: DC | PRN

## 2022-10-30 MED ORDER — ANTICOAGULANT SODIUM CITRATE 4% (200MG/5ML) IV SOLN: 5.0000 mL | Status: DC | PRN

## 2022-10-30 MED ORDER — LIDOCAINE-PRILOCAINE 2.5-2.5 % EX CREA: 1.0000 | TOPICAL_CREAM | CUTANEOUS | Status: DC | PRN

## 2022-10-30 MED ORDER — FUROSEMIDE 80 MG PO TABS: 80.0000 mg | ORAL_TABLET | ORAL | 0 refills | Status: DC

## 2022-10-30 MED ORDER — ALTEPLASE 2 MG IJ SOLR: 2.0000 mg | Freq: Once | INTRAMUSCULAR | Status: DC | PRN

## 2022-10-30 MED ORDER — LIDOCAINE HCL (PF) 1 % IJ SOLN: 5.0000 mL | INTRAMUSCULAR | Status: DC | PRN

## 2022-10-30 NOTE — TOC Transition Note (Addendum)
Transition of Care [facility]) - CM/SW Discharge Note   Patient Details  Name: [name] [name] MRN: [id number] Date of Birth: [birth year]  Transition of Care [facility]) CM/SW Contact:  Delilah Shan, LCSWA Phone Number: 10/30/2022, 5:07 PM   Clinical Narrative:      Patient will DC to: [facility]  Anticipated DC date: 10/30/2022  Family notified: Jonny Ruiz  Transport by: Sharin Mons set up for pick up at 7:00pm  ?  Per MD patient ready for DC to [facility] . RN, patient, patient's family, and facility notified of DC. Discharge Summary sent to facility. RN given number for report (207p, [phone]) . DC packet on chart. DNR signed by MD attached to patients hard chart.Ambulance transport requested for patient.  CSW signing off.    Final next level of care: Skilled Nursing Facility Barriers to Discharge: No Barriers Identified   Patient Goals and CMS Choice      Discharge Placement                Patient chooses bed at: [facility] Patient to be transferred to facility by: [facility]   Patient and family notified of of transfer: 10/30/22  Discharge Plan and Services Additional resources added to the After Visit Summary for   In-house Referral: Clinical Social Work                                   Social Determinants of Health (SDOH) Interventions SDOH Screenings   Food Insecurity: No Food Insecurity (10/26/2022)  Housing: Low Risk  (10/26/2022)  Transportation Needs: No Transportation Needs (10/26/2022)  Utilities: Not At Risk (10/26/2022)  Alcohol Screen: Low Risk  (08/22/2020)  Depression (PHQ2-9): Low Risk  (08/21/2022)  Financial Resource Strain: Low Risk  (08/22/2020)  Physical Activity: Inactive (08/22/2020)  Social Connections: Moderately Integrated (08/22/2020)  Stress: No Stress Concern Present (08/22/2020)  Tobacco Use: Medium Risk (10/26/2022)     Readmission Risk Interventions    10/26/2022    1:22 PM 08/06/2022    3:31 PM 07/23/2022   12:43 PM   Readmission Risk Prevention Plan  Transportation Screening Complete Complete Complete  PCP or Specialist Appt within 5-7 Days   Complete  PCP or Specialist Appt within 3-5 Days  Complete   Home Care Screening   Complete  Medication Review (RN CM)   Referral to Pharmacy  HRI or Home Care Consult  Complete   Social Work Consult for Recovery Care Planning/Counseling  --   Palliative Care Screening  Not Applicable   Medication Review Oceanographer) Complete Complete   PCP or Specialist appointment within 3-5 days of discharge Complete    HRI or Home Care Consult Complete    SW Recovery Care/Counseling Consult Complete    Palliative Care Screening Not Applicable    Skilled Nursing Facility Complete

## 2022-10-30 NOTE — TOC Progression Note (Signed)
Transition of Care [facility]) - Progression Note    Patient Details  Name: [name] [name] MRN: [id number] Date of Birth: 1941/01/14  [facility]) CM/SW Contact  Leander Rams, LCSW Phone Number: 10/30/2022, 1:14 PM  Clinical Narrative:    CSW was informed pt will dc after HD. CSW confirmed with Sheliah Hatch that pt will return today. CSW received room number and number for report (207p, [phone]) RN has received this information.  TOC will continue to assist in dc planning needs.    Expected Discharge Plan: Skilled Nursing Facility Barriers to Discharge: Continued Medical Work up  Expected Discharge Plan and Services In-house Referral: Clinical Social Work     Living arrangements for the past 2 months: Skilled Nursing Facility Expected Discharge Date: 10/30/22                                     Social Determinants of Health (SDOH) Interventions SDOH Screenings   Food Insecurity: No Food Insecurity (10/26/2022)  Housing: Low Risk  (10/26/2022)  Transportation Needs: No Transportation Needs (10/26/2022)  Utilities: Not At Risk (10/26/2022)  Alcohol Screen: Low Risk  (08/22/2020)  Depression (PHQ2-9): Low Risk  (08/21/2022)  Financial Resource Strain: Low Risk  (08/22/2020)  Physical Activity: Inactive (08/22/2020)  Social Connections: Moderately Integrated (08/22/2020)  Stress: No Stress Concern Present (08/22/2020)  Tobacco Use: Medium Risk (10/26/2022)    Readmission Risk Interventions    10/26/2022    1:22 PM 08/06/2022    3:31 PM 07/23/2022   12:43 PM  Readmission Risk Prevention Plan  Transportation Screening Complete Complete Complete  PCP or Specialist Appt within 5-7 Days   Complete  PCP or Specialist Appt within 3-5 Days  Complete   Home Care Screening   Complete  Medication Review (RN CM)   Referral to Pharmacy  HRI or Home Care Consult  Complete   Social Work Consult for Recovery Care Planning/Counseling  --   Palliative Care Screening  Not  Applicable   Medication Review Oceanographer) Complete Complete   PCP or Specialist appointment within 3-5 days of discharge Complete    HRI or Home Care Consult Complete    SW Recovery Care/Counseling Consult Complete    Palliative Care Screening Not Applicable    Skilled Nursing Facility Complete     Oletta Lamas, MSW, [place], LCASA Transitions of Care  Clinical Social Worker I

## 2022-10-30 NOTE — Progress Notes (Signed)
Report called to Nia, Charity fundraiser at [facility], [place]. Full report provided with all questions answered at this time. Pt to be discharged with R Subclavian HD cath. At time of report, pt A&O x 4 and VSS. This RN relayed that [facility] will be transporting pt and was scheduled for 1900 this evening. Information provided to oncoming RN, Florentina Addison.

## 2022-10-30 NOTE — Progress Notes (Signed)
This RN attempted to call report x 2 to [facility] receiving RN. No answer received during both attempts. Charge RN, Trey Paula, made aware.

## 2022-10-30 NOTE — Progress Notes (Signed)
   10/30/22 1745  Vitals  Temp (!) 97.4 F (36.3 C)  BP (!) 92/48  MAP (mmHg) (!) 61  Pulse Rate 62  ECG Heart Rate 62  Resp 14  Oxygen Therapy  SpO2 100 %  Post Treatment  Dialyzer Clearance Heavily streaked  Liters Processed 54.2  Fluid Removed (mL) 700 mL  Tolerated HD Treatment  (low b/ps remain asymptomatic unable to reach goal)  Post-Hemodialysis Comments pt stable remains alert watching TV treatment ended 15 minutes early to prevent clotting of system  Note  Patient Observations alert watching TV tx completed  Hemodialysis Catheter Right Internal jugular Double lumen Permanent (Tunneled)  Placement Date/Time: 10/16/22 0841   Placed prior to admission: No  Serial / Lot #: [id number]  Expiration Date: 03/02/27  Time Out: Correct patient;Correct site;Correct procedure  Maximum sterile barrier precautions: Hand hygiene;Cap;Mask;Sterile gown...  Site Condition No complications  Blue Lumen Status Heparin locked;Dead end cap in place  Red Lumen Status Heparin locked;Dead end cap in place  Purple Lumen Status N/A  Catheter fill solution Heparin 1000 units/ml  Catheter fill volume (Arterial) 1.6 cc  Catheter fill volume (Venous) 1.6  Dressing Type Transparent  Dressing Status Antimicrobial disc in place  Interventions Dressing changed  Drainage Description None  Post treatment catheter status Capped and Clamped    Alert and oriented.  Informed consent signed and in chart.   TX duration:245  Patient tolerated well.   Alert, without acute distress.  Hand-off given to patient's nurse.   Access used: Rt HD catheter Access issues: none  Total UF removed: 700 Medication(s) given: midodrine Post HD VS: see above Post HD weight: 71.9kg   [facility]

## 2022-10-30 NOTE — Progress Notes (Signed)
PT Cancellation Note  Patient Details Name: [name] [name] MRN: [id number] DOB: 1940/05/01   Cancelled Treatment:    Reason Eval/Treat Not Completed: Patient at procedure or test/unavailable.  Pt in HD, then to d/c to [place]. 10/30/2022  Jacinto Halim., PT Acute Rehabilitation Services [phone]  (office)   [name] 10/30/2022, 5:32 PM

## 2022-10-30 NOTE — Progress Notes (Signed)
[facility] Progress Note  Name: [name] [name] MRN: [id number] DOB: December 30, 1940  Chief Complaint:  Altered mental status   Subjective:  Last HD on 8/26 with 2 kg UF.  She had 1.3 liters UOP over 8/29 charted.  She had her picc removed yesterday by IR.  Team is planning for discharge today and she came from SNF.   She feels good.  She's glad swelling has gone down so much.  Improved with HD but also improved in the interim since HD, as well.   Review of systems:     Denies shortness of breath  Denies chest pain  Denies n/v   ------------  Background on consult:  [name] [name] is a 82 y.o. female with ?AKI v. ESRD, HTN, HL, T2DM, CAD, A-fib on Eliquis who was admitted with AMS.   Evaluated in ED - at that time, she was not really responsive. Husband was at bedside and provided history. Was in her usual state of (frail) health yesterday. He was called by SNF this AM that she was being transported to ED for non-responsiveness. He denies any new complaints over the past few days. No new medications at he is aware of. In the ED, she was extremely hypothermic - T91.66F - and she was hypoxic. Sepsis protocol activated. She was placed under a warming blanket, pan-cultured. Labs showed Na 132, K 4, CO2 27, BUN 16, Cr 2.13, Alb 1.9, Trop 11 -> 10, WBC 12.1, Hgb 9.6, Plts 319. Flu negative, but COVID POSITIVE. Head CT without acute CVA. CXR with ^ vascular congestion.  Has been on dialysis since 08/2022 - considered "AKI" by her outpatient unit. Baseline Cr was 2's - up to 4.5 range with uremia and overload, started HD at that time. Reviewing outpatient records - losing weight and dry weight is being lowered, ?assuming doesn't make much urine. Her muscle mass is very low so Cr can be deceivingly low. Should have finished a course of Ertapenem for Klebsiella bacteremia yesterday.   Intake/Output Summary (Last 24 hours) at 10/30/2022 0836 Last data filed at 10/30/2022 0036 Gross per  24 hour  Intake 960 ml  Output 1300 ml  Net -340 ml    Vitals:  Vitals:   10/29/22 2035 10/30/22 0032 10/30/22 0528 10/30/22 0736  BP: 122/64 (!) 118/52 (!) 139/55 (!) (P) 120/52  Pulse: 69 65 68   Resp: 19 14 14 16   Temp: 97.6 F (36.4 C) (!) 97.5 F (36.4 C) (!) 97.5 F (36.4 C) (!) (P) 97.3 F (36.3 C)  TempSrc: Oral Oral Axillary (P) Axillary  SpO2: 95% 100% 100% (P) 100%  Weight:   72.1 kg   Height:         Physical Exam:       General adult female in bed in no acute distress HEENT normocephalic atraumatic extraocular movements intact sclera anicteric Neck supple trachea midline Lungs clear to auscultation bilaterally normal work of breathing at rest on room air  Heart S1S2 no rub Abdomen soft nontender nondistended Extremities 1-2+ edema lower extremities - much improved Psych normal mood and affect Neuro - alert and oriented x 3 provides hx and follows commands; conversant Access RIJ tunn catheter in place   Medications reviewed   Labs:     Latest Ref Rng & Units 10/30/2022    7:20 AM 10/29/2022    3:21 AM 10/28/2022    3:55 AM  BMP  Glucose 70 - 99 mg/dL 93  784  696  BUN 8 - 23 mg/dL 38  26  16   Creatinine 0.44 - 1.00 mg/dL 8.29  5.62  1.30   Sodium 135 - 145 mmol/L 133  131  134   Potassium 3.5 - 5.1 mmol/L 4.9  4.3  4.1   Chloride 98 - 111 mmol/L 94  93  98   CO2 22 - 32 mmol/L 29  26  28    Calcium 8.9 - 10.3 mg/dL 8.6  8.3  8.2    Dialysis Orders:  MWF at [facility] 3:30hr, 400/600, EDW 74.1kg, 3K/2.5Ca, TDC, no heparin - Last HD 8/23, left at 73.6kg - Mircera IV q 2 weeks - last given 7/29   Assessment/Plan:   # AMS/encephalopathy  - may be secondary to covid   - improving per charting  - work-up per primary team    # Covid 19 pna - therapies per primary team - s/p remdesivir   # Dialysis-dependent AKI - Had been on dialysis per MWF schedule.  Nonoliguric and she appears to have some evidence of renal recovery; however, she does have  continued rise in creatinine in between HD sessions and presented with overload despite HD and is 82 years old.  - For now continue with outpatient HD - we will monitor for renal recovery outpatient.  Hopeful to get off of HD in the next couple of weeks.  Next HD can be outpatient today vs tomorrow.  (She does not need dialysis here today).  I have reached out to HD SW to see if we can set this up.     - I have added Lasix 80 mg PO daily on non-dialysis days and would continue this on discharge - Continue to monitor for renal recovery   # HTN - hx of same - optimize volume with HD and lasix as above - she has actually been on scheduled BID midodrine here   # Anemia of CKD  - received aranesp 100 mcg on 10/27/22   # Metabolic bone disease  - phos acceptable - aki patient     # Atrial fibrillation  - agents per primary team - on amio and eliquis  Disposition - Per primary team.  Acceptable for discharge from a strictly renal standpoint.  We will continue monitoring for renal recovery as an outpatient.  Spoke with primary team and reached out to SW  [name] Emms, MD 10/30/2022 8:58 AM

## 2022-10-30 NOTE — Plan of Care (Signed)
  Problem: Fluid Volume: Goal: Ability to maintain a balanced intake and output will improve Outcome: Progressing   Problem: Coping: Goal: Ability to adjust to condition or change in health will improve Outcome: Progressing   Problem: Skin Integrity: Goal: Risk for impaired skin integrity will decrease Outcome: Progressing   Problem: Education: Goal: Knowledge of General Education information will improve Description: Including pain rating scale, medication(s)/side effects and non-pharmacologic comfort measures Outcome: Progressing   Problem: Activity: Goal: Risk for activity intolerance will decrease Outcome: Progressing   Problem: Nutrition: Goal: Adequate nutrition will be maintained Outcome: Progressing   Problem: Coping: Goal: Level of anxiety will decrease Outcome: Progressing   Problem: Safety: Goal: Ability to remain free from injury will improve Outcome: Progressing   Problem: Skin Integrity: Goal: Risk for impaired skin integrity will decrease Outcome: Progressing

## 2022-10-30 NOTE — Plan of Care (Signed)
  Problem: Education: Goal: Ability to describe self-care measures that may prevent or decrease complications (Diabetes Survival Skills Education) will improve Outcome: Adequate for Discharge Goal: Individualized Educational Video(s) Outcome: Adequate for Discharge   Problem: Coping: Goal: Ability to adjust to condition or change in health will improve Outcome: Adequate for Discharge   Problem: Fluid Volume: Goal: Ability to maintain a balanced intake and output will improve Outcome: Adequate for Discharge   Problem: Health Behavior/Discharge Planning: Goal: Ability to identify and utilize available resources and services will improve Outcome: Adequate for Discharge Goal: Ability to manage health-related needs will improve Outcome: Adequate for Discharge   Problem: Metabolic: Goal: Ability to maintain appropriate glucose levels will improve Outcome: Adequate for Discharge   Problem: Nutritional: Goal: Maintenance of adequate nutrition will improve Outcome: Adequate for Discharge Goal: Progress toward achieving an optimal weight will improve Outcome: Adequate for Discharge   Problem: Skin Integrity: Goal: Risk for impaired skin integrity will decrease Outcome: Adequate for Discharge   Problem: Tissue Perfusion: Goal: Adequacy of tissue perfusion will improve Outcome: Adequate for Discharge   Problem: Education: Goal: Knowledge of General Education information will improve Description: Including pain rating scale, medication(s)/side effects and non-pharmacologic comfort measures Outcome: Adequate for Discharge   Problem: Health Behavior/Discharge Planning: Goal: Ability to manage health-related needs will improve Outcome: Adequate for Discharge   Problem: Clinical Measurements: Goal: Ability to maintain clinical measurements within normal limits will improve Outcome: Adequate for Discharge Goal: Will remain free from infection Outcome: Adequate for Discharge Goal:  Diagnostic test results will improve Outcome: Adequate for Discharge Goal: Respiratory complications will improve Outcome: Adequate for Discharge Goal: Cardiovascular complication will be avoided Outcome: Adequate for Discharge   Problem: Activity: Goal: Risk for activity intolerance will decrease Outcome: Adequate for Discharge   Problem: Nutrition: Goal: Adequate nutrition will be maintained Outcome: Adequate for Discharge   Problem: Coping: Goal: Level of anxiety will decrease Outcome: Adequate for Discharge   Problem: Elimination: Goal: Will not experience complications related to bowel motility Outcome: Adequate for Discharge Goal: Will not experience complications related to urinary retention Outcome: Adequate for Discharge   Problem: Pain Managment: Goal: General experience of comfort will improve Outcome: Adequate for Discharge   Problem: Safety: Goal: Ability to remain free from injury will improve Outcome: Adequate for Discharge   Problem: Skin Integrity: Goal: Risk for impaired skin integrity will decrease Outcome: Adequate for Discharge   Problem: Education: Goal: Knowledge of risk factors and measures for prevention of condition will improve Outcome: Adequate for Discharge   Problem: Coping: Goal: Psychosocial and spiritual needs will be supported Outcome: Adequate for Discharge   Problem: Respiratory: Goal: Will maintain a patent airway Outcome: Adequate for Discharge Goal: Complications related to the disease process, condition or treatment will be avoided or minimized Outcome: Adequate for Discharge

## 2022-10-30 NOTE — Discharge Summary (Signed)
Physician Discharge Summary  [name] [name] ION:[id number] DOB: 1940-07-08 DOA: 10/26/2022  PCP: [name], MD  Admit date: 10/26/2022 Discharge date: 10/30/2022  Admitted From: [facility] Disposition: [facility]  Recommendations for Outpatient Follow-up:  Follow up with PCP in 1-2 weeks Discontinued NPH in favor of Lantus 5 units subcutaneously nightly; continue to monitor blood sugars closely Furosemide increased to 80 mg p.o. on Tuesday/Thursday/Saturday/Sundays Recommend repeat TFTs in a few weeks given recent started on amiodarone   Discharge Condition: Stable CODE STATUS: DNR Diet recommendation: Renal diet  History of present illness:  [name] [name] is a 82 y.o. 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   Recently hospitalized 8/7 - 8/17 after having suspected cardiac arrest with TTE showing LVEF 25%.  Patient was found bacteremic with ESBL Klebsiella during that hospitalization in which infectious disease was consulted and recommended to complete 2 weeks of ertapenem.   Per EMS run sheet, patient has been having frequent glucose drops at SNF and had been given glucagon.  Also if she is becoming more hypoxic requiring nasal cannula.   In the ED, temperature 92.9 degrees Fahrenheit rectally, HR 58, RR 19, BP 142/58, SpO2 100% on 4 L nasal cannula.  WBC 12.1, hemoglobin 9.6, platelet count 319.  Sodium 132, potassium 4.0, chloride 94, CO2 27, glucose 124, BUN 16, creatinine 2.13.  AST 40, ALT 12, total bilirubin 0.5.  BNP 1951.8.  High sensitive troponin 11 followed by 10.  Lactic  acid 0.7.  TSH 10.745, free T4 1.35.  Urinalysis with moderate leukocytes, negative nitrite, rare bacteria, 0-5 WBCs.  Cova-19 PCR positive.  Influenza/RSV PCR negative.  Hospital course:  Acute metabolic encephalopathy likely secondary to hypoglycemia versus COVID viral infection Patient reportedly more confused at SNF, likely combination of acute COVID viral infection as well as hypoglycemia.  CT head without contrast with no acute finding, noted aging brain and extensive left temporal encephalomalacia.  Patient appears to be now back at her typical baseline.   Hypoglycemia Hx type 2 diabetes mellitus Patient with reported episodes of hypoglycemia at SNF.  Current home regimen includes NPH 10 units before breakfast and 5 units before dinner.  Hemoglobin A1c 9.7 on 09/06/2022, poorly controlled; but had several episodes of hypoglycemia at SNF.  Discontinue NPH in favor of Lantus 5 unit subcutaneously nightly.  Continue to closely monitor blood sugar.   COVID viral infection COVID PCR positive on admission.  Completed 3-day course of IV remdesivir.  Oxygenating well on room air.   History of ESBL Klebsiella bacteremia Patient recently hospitalized and currently on IV antibiotics for ESBL Klebsiella bacteremia.  Initially diagnosed on 10/07/2022, underwent line holiday with repeat blood cultures on 8/13 that were negative.  Per infectious disease, patient to complete 2-week course of IV antibiotics which would end on 10/25/2022.  Patient had follow-up scheduled with ID, Dr. Algis Liming on 8/26 but due to this hospitalization has missed that appointment.  Discussed with ID, given this hospitalization is unrelated to bacteremia no change in plan and okay to discontinue IV antibiotics.   Advanced CKD versus ESRD on HD Typically dialyzes outpatient at [facility] on MWF schedule.  Nephrology was consulted and followed during hospital course.  Continue furosemide 80 mg p.o. on nondialysis days.  Continue outpatient  HD.   Chronic hypotension Midodrine 5 mg p.o. twice daily and 10 mg on dialysis days   Chronic systolic congestive heart failure Unable to tolerate GDMT due to chronic hypotension BNP elevated on admission. Continue fluid management per HD   Paroxysmal atrial fibrillation Continue Eliquis   Anemia of chronic medical/renal disease Hemoglobin 8.7, stable.   Abnormal thyroid studies TSH 10.745 and free T4 1.35.  Patient recently started on amiodarone. Recheck thyroid studies in a few weeks.   GERD Protonix 40 mg p.o. 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), stage IV requiring dialysis (HCC)   Chronic hypotension   History of bacteremia   Acute on chronic systolic CHF (congestive heart failure) (HCC)   Hyponatremia   Paroxysmal atrial fibrillation (HCC)   Abnormal results of thyroid function studies   Hyperlipidemia   Insulin-dependent type 2 diabetes with hypoglycemia (HCC)   Anemia due to chronic kidney disease   Malnutrition of moderate degree    Discharge Instructions  Discharge Instructions     Diet - low sodium heart healthy   Complete by: As directed    Increase activity slowly   Complete by: As directed    No wound care   Complete by: As directed       Allergies as of 10/30/2022       Reactions   Procaine Hcl Anaphylaxis   Amoxicillin Itching        Medication List     STOP taking these medications    ertapenem IVPB Commonly known as: INVANZ   insulin NPH-regular Human (70-30) 100 UNIT/ML injection       TAKE these medications    alendronate 35 MG tablet Commonly known as: FOSAMAX Take 35 mg by mouth every 7 (seven) days. Take with a full glass of water on an empty stomach.   amiodarone 200 MG tablet Commonly known as: PACERONE Take 1  tablet (200 mg total) by mouth daily.   apixaban 2.5 MG Tabs tablet Commonly known as: ELIQUIS Take 1 tablet (2.5 mg total) by mouth 2 (two) times daily. To start likely on Friday 6/14 if recommended by Coumadin clinic after labs done   atorvastatin 40 MG tablet Commonly known as: LIPITOR Take 1 tablet (40 mg total) by mouth daily. What changed: when to take this   ezetimibe 10 MG tablet Commonly known as: ZETIA Take 1 tablet (10 mg total) by mouth daily.   furosemide 80 MG tablet Commonly known as: LASIX Take 1 tablet (80 mg total) by mouth every Tuesday, Thursday, Saturday, and Sunday. Start taking on: October 31, 2022   insulin glargine 100 UNIT/ML Solostar Pen Commonly known as: LANTUS Inject 5 Units into the skin at bedtime.   midodrine 5 MG tablet Commonly known as: PROAMATINE Take 1 tablet (5 mg total) by mouth 3 (three) times daily. What changed:  when to take this additional instructions   midodrine 10 MG tablet Commonly known as: PROAMATINE Take 1 tablet (10 mg total) by mouth every Monday, Wednesday, and Friday with hemodialysis. What changed: Another medication with the same name was changed. Make sure you understand how and when to take each.   MULTIVITAMIN ADULT PO  Take 1 tablet by mouth daily with breakfast.   nystatin powder Commonly known as: MYCOSTATIN/NYSTOP Apply 1 Application topically 2 (two) times daily.   pantoprazole 40 MG tablet Commonly known as: PROTONIX Take 1 tablet (40 mg total) by mouth daily.   polyethylene glycol 17 g packet Commonly known as: MIRALAX / GLYCOLAX Take 17 g by mouth daily.   sodium chloride 0.9 % infusion Inject into the vein.   VITAMIN D (CHOLECALCIFEROL) PO Take 1 tablet by mouth daily.        Follow-up Information     [name], MD. Schedule an appointment as soon as possible for a visit in 1 week(s).   Specialty: Internal Medicine Contact information: [address] [place]  [address] [phone]                Allergies  Allergen Reactions   Procaine Hcl Anaphylaxis   Amoxicillin Itching    Consultations: Nephrology Discussed with ID Palliative care   Procedures/Studies: IR Removal Tun Cv Cath W/O FL  Result Date: 10/29/2022 INDICATION: Tunneled right IJ central venous catheter placed 10/16/2022 is no longer needed. Request for removal. EXAM: REMOVAL OF TUNNELED RIGHT IJ CENTRAL VENOUS CATHETER MEDICATIONS: None COMPLICATIONS: None immediate. PROCEDURE: Informed written consent was obtained from the patient following an explanation of the procedure, risks, benefits and alternatives to treatment. A time out was performed prior to the initiation of the procedure. Maximal barrier sterile technique was utilized including caps, mask, sterile gowns, sterile gloves, large sterile drape, hand hygiene, and chlorhexidine. Utilizing a combination of blunt dissection and gentle traction, the cuff of the catheter was exposed and the catheter was removed intact. Hemostasis was obtained with manual compression. A dressing was placed. The patient tolerated the procedure well without immediate post procedural complication. IMPRESSION: Successful removal of a RIGHT chest tunneled hemodialysis catheter. Procedure performed by Brayton El PA-C and supervised by Dr. Roanna Banning Electronically Signed   By: Roanna Banning M.D.   On: 10/29/2022 15:00   CT Head Wo Contrast  Result Date: 10/26/2022 CLINICAL DATA:  Mental status change with unknown cause EXAM: CT HEAD WITHOUT CONTRAST TECHNIQUE: Contiguous axial images were obtained from the base of the skull through the vertex without intravenous contrast. RADIATION DOSE REDUCTION: This exam was performed according to the departmental dose-optimization program which includes automated exposure control, adjustment of the mA and/or kV according to patient size and/or use of iterative reconstruction technique. COMPARISON:  10/07/2022  FINDINGS: Brain: Extensive encephalomalacia in the left temporal lobe involving both the medial and lateral portions with regional calcification along the sylvian fissures. 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 Low-density in the cerebral white matter attributed to chronic small vessel ischemia. There is generalized brain atrophy. Vascular: No hyperdense vessel. Skull: Normal. Negative for fracture or focal lesion. Sinuses/Orbits: No acute finding. IMPRESSION: 1. No acute finding. 2. Aging brain and extensive left temporal encephalomalacia. Electronically Signed   By: Tiburcio Pea M.D.   On: 10/26/2022 06:02   DG Chest Portable 1 View  Result Date: 10/26/2022 CLINICAL DATA:  Difficulty breathing EXAM: PORTABLE CHEST 1 VIEW COMPARISON:  10/11/2022 FINDINGS: Cardiac shadow is enlarged but stable. Aortic calcifications are seen. Dialysis catheter is again noted. Increased vascular congestion is noted when compare with the prior exam. No significant edema is noted. No acute bony abnormality is seen. Old rib fractures are noted bilaterally. Tunneled central venous line is noted with the catheter tip at the cavoatrial  junction. IMPRESSION: Changes of vascular congestion without significant edema. Electronically Signed   By: Alcide Clever M.D.   On: 10/26/2022 03:58   IR Fluoro Guide CV Line Right  Result Date: 10/16/2022 INDICATION: 82 year old female with end-stage renal disease on hemodialysis. She recently suffered from a tunneled catheter site infection requiring line holiday. Now she requires placement of a [name] tunneled HD catheter as well as placement of a tunneled central line for long-term antibiotic therapy. EXAM: TUNNELED CENTRAL VENOUS HEMODIALYSIS CATHETER PLACEMENT WITH ULTRASOUND AND FLUOROSCOPIC GUIDANCE TUNNELED CENTRAL CATHETER PLACEMENT WITH ULTRASOUND AND FLUOROSCOPIC GUIDANCE. MEDICATIONS: In patient receiving antibiotic therapy. No additional prophylaxis given.  ANESTHESIA/SEDATION: Moderate (conscious) sedation was employed during this procedure. A total of Versed 0.5 mg and Fentanyl 50 mcg was administered intravenously. Moderate Sedation Time: 27 minutes. The patient's level of consciousness and vital signs were monitored continuously by radiology nursing throughout the procedure under my direct supervision. FLUOROSCOPY TIME:  Radiation exposure index: 1 mGy air kerma COMPLICATIONS: None immediate. PROCEDURE: Informed written consent was obtained from the patient after a discussion of the risks, benefits, and alternatives to treatment. Questions regarding the procedure were encouraged and answered. The right neck and chest were prepped with chlorhexidine in a sterile fashion, and a sterile drape was applied covering the operative field. Maximum barrier sterile technique with sterile gowns and gloves were used for the procedure. A timeout was performed prior to the initiation of the procedure. After creating a small venotomy incision, a micropuncture kit was utilized to access the right internal jugular vein under direct, real-time ultrasound guidance after the overlying soft tissues were anesthetized with 1% lidocaine with epinephrine. Ultrasound image documentation was performed. The microwire was kinked to measure appropriate catheter length. A stiff Glidewire was advanced to the level of the IVC and the micropuncture sheath was exchanged for a peel-away sheath. A palindrome tunneled hemodialysis catheter measuring 19 cm from tip to cuff was tunneled in a retrograde fashion from the anterior chest wall to the venotomy incision. The catheter was then placed through the peel-away sheath with tips ultimately positioned within the superior aspect of the right atrium. Final catheter positioning was confirmed and documented with a spot radiographic image. The catheter aspirates and flushes normally. The catheter was flushed with appropriate volume heparin dwells. The right  internal jugular vein was interrogated with ultrasound and found to be patent with some nonocclusive thrombus. An image was obtained and stored for the medical record. Local anesthesia was attained by infiltration with 1% lidocaine. A small dermatotomy was made. Under real-time sonographic guidance, the vessel was punctured with a 21 gauge micropuncture needle. Using standard technique, the initial micro needle was exchanged over a 0.018 micro wire for a transitional 4 [place] micro sheath. A peel-away sheath was then advanced over the wire. A suitable skin exit site inferior to the clavicle was selected and anesthetized with 1% lidocaine. A small dermatotomy was made. The dual lumen tunneled power line was then tunneled from the skin exit site to the dermatotomy overlying the [name] venous access site. The catheter was then cut to 22 cm in total length and advanced through the peel-away sheath. The catheter tip was positioned at the cavoatrial junction. The catheter flushes and aspirates easily. The catheter was capped and flushed The catheter exit sites were secured with a 0-Prolene retention suture. The venotomy incisions were closed with Dermabond. Dressings were applied. The patient tolerated the procedure well without immediate post procedural complication. IMPRESSION: Successful placement of 19 cm  tip to cuff tunneled hemodialysis catheter via the right internal jugular vein with tips terminating within the superior aspect of the right atrium. The catheter is ready for immediate use. Successful placement of a tunneled dual lumen power injectable central venous catheter also via the right internal jugular vein. This catheter tip is at the superior cavoatrial junction and is ready for immediate use. Electronically Signed   By: Malachy Moan M.D.   On: 10/16/2022 14:52   IR Fluoro Guide CV Line Right  Result Date: 10/16/2022 INDICATION: 81 year old female with end-stage renal disease on hemodialysis. She  recently suffered from a tunneled catheter site infection requiring line holiday. Now she requires placement of a [name] tunneled HD catheter as well as placement of a tunneled central line for long-term antibiotic therapy. EXAM: TUNNELED CENTRAL VENOUS HEMODIALYSIS CATHETER PLACEMENT WITH ULTRASOUND AND FLUOROSCOPIC GUIDANCE TUNNELED CENTRAL CATHETER PLACEMENT WITH ULTRASOUND AND FLUOROSCOPIC GUIDANCE. MEDICATIONS: In patient receiving antibiotic therapy. No additional prophylaxis given. ANESTHESIA/SEDATION: Moderate (conscious) sedation was employed during this procedure. A total of Versed 0.5 mg and Fentanyl 50 mcg was administered intravenously. Moderate Sedation Time: 27 minutes. The patient's level of consciousness and vital signs were monitored continuously by radiology nursing throughout the procedure under my direct supervision. FLUOROSCOPY TIME:  Radiation exposure index: 1 mGy air kerma COMPLICATIONS: None immediate. PROCEDURE: Informed written consent was obtained from the patient after a discussion of the risks, benefits, and alternatives to treatment. Questions regarding the procedure were encouraged and answered. The right neck and chest were prepped with chlorhexidine in a sterile fashion, and a sterile drape was applied covering the operative field. Maximum barrier sterile technique with sterile gowns and gloves were used for the procedure. A timeout was performed prior to the initiation of the procedure. After creating a small venotomy incision, a micropuncture kit was utilized to access the right internal jugular vein under direct, real-time ultrasound guidance after the overlying soft tissues were anesthetized with 1% lidocaine with epinephrine. Ultrasound image documentation was performed. The microwire was kinked to measure appropriate catheter length. A stiff Glidewire was advanced to the level of the IVC and the micropuncture sheath was exchanged for a peel-away sheath. A palindrome tunneled  hemodialysis catheter measuring 19 cm from tip to cuff was tunneled in a retrograde fashion from the anterior chest wall to the venotomy incision. The catheter was then placed through the peel-away sheath with tips ultimately positioned within the superior aspect of the right atrium. Final catheter positioning was confirmed and documented with a spot radiographic image. The catheter aspirates and flushes normally. The catheter was flushed with appropriate volume heparin dwells. The right internal jugular vein was interrogated with ultrasound and found to be patent with some nonocclusive thrombus. An image was obtained and stored for the medical record. Local anesthesia was attained by infiltration with 1% lidocaine. A small dermatotomy was made. Under real-time sonographic guidance, the vessel was punctured with a 21 gauge micropuncture needle. Using standard technique, the initial micro needle was exchanged over a 0.018 micro wire for a transitional 4 [place] micro sheath. A peel-away sheath was then advanced over the wire. A suitable skin exit site inferior to the clavicle was selected and anesthetized with 1% lidocaine. A small dermatotomy was made. The dual lumen tunneled power line was then tunneled from the skin exit site to the dermatotomy overlying the [name] venous access site. The catheter was then cut to 22 cm in total length and advanced through the peel-away sheath. The catheter tip was  positioned at the cavoatrial junction. The catheter flushes and aspirates easily. The catheter was capped and flushed The catheter exit sites were secured with a 0-Prolene retention suture. The venotomy incisions were closed with Dermabond. Dressings were applied. The patient tolerated the procedure well without immediate post procedural complication. IMPRESSION: Successful placement of 19 cm tip to cuff tunneled hemodialysis catheter via the right internal jugular vein with tips terminating within the superior aspect of the  right atrium. The catheter is ready for immediate use. Successful placement of a tunneled dual lumen power injectable central venous catheter also via the right internal jugular vein. This catheter tip is at the superior cavoatrial junction and is ready for immediate use. Electronically Signed   By: Malachy Moan M.D.   On: 10/16/2022 14:52   IR US Guide Vasc Access Right  Result Date: 10/16/2022 INDICATION: 82 year old female with end-stage renal disease on hemodialysis. She recently suffered from a tunneled catheter site infection requiring line holiday. Now she requires placement of a [name] tunneled HD catheter as well as placement of a tunneled central line for long-term antibiotic therapy. EXAM: TUNNELED CENTRAL VENOUS HEMODIALYSIS CATHETER PLACEMENT WITH ULTRASOUND AND FLUOROSCOPIC GUIDANCE TUNNELED CENTRAL CATHETER PLACEMENT WITH ULTRASOUND AND FLUOROSCOPIC GUIDANCE. MEDICATIONS: In patient receiving antibiotic therapy. No additional prophylaxis given. ANESTHESIA/SEDATION: Moderate (conscious) sedation was employed during this procedure. A total of Versed 0.5 mg and Fentanyl 50 mcg was administered intravenously. Moderate Sedation Time: 27 minutes. The patient's level of consciousness and vital signs were monitored continuously by radiology nursing throughout the procedure under my direct supervision. FLUOROSCOPY TIME:  Radiation exposure index: 1 mGy air kerma COMPLICATIONS: None immediate. PROCEDURE: Informed written consent was obtained from the patient after a discussion of the risks, benefits, and alternatives to treatment. Questions regarding the procedure were encouraged and answered. The right neck and chest were prepped with chlorhexidine in a sterile fashion, and a sterile drape was applied covering the operative field. Maximum barrier sterile technique with sterile gowns and gloves were used for the procedure. A timeout was performed prior to the initiation of the procedure. After creating a  small venotomy incision, a micropuncture kit was utilized to access the right internal jugular vein under direct, real-time ultrasound guidance after the overlying soft tissues were anesthetized with 1% lidocaine with epinephrine. Ultrasound image documentation was performed. The microwire was kinked to measure appropriate catheter length. A stiff Glidewire was advanced to the level of the IVC and the micropuncture sheath was exchanged for a peel-away sheath. A palindrome tunneled hemodialysis catheter measuring 19 cm from tip to cuff was tunneled in a retrograde fashion from the anterior chest wall to the venotomy incision. The catheter was then placed through the peel-away sheath with tips ultimately positioned within the superior aspect of the right atrium. Final catheter positioning was confirmed and documented with a spot radiographic image. The catheter aspirates and flushes normally. The catheter was flushed with appropriate volume heparin dwells. The right internal jugular vein was interrogated with ultrasound and found to be patent with some nonocclusive thrombus. An image was obtained and stored for the medical record. Local anesthesia was attained by infiltration with 1% lidocaine. A small dermatotomy was made. Under real-time sonographic guidance, the vessel was punctured with a 21 gauge micropuncture needle. Using standard technique, the initial micro needle was exchanged over a 0.018 micro wire for a transitional 4 [place] micro sheath. A peel-away sheath was then advanced over the wire. A suitable skin exit site inferior to the clavicle was  selected and anesthetized with 1% lidocaine. A small dermatotomy was made. The dual lumen tunneled power line was then tunneled from the skin exit site to the dermatotomy overlying the [name] venous access site. The catheter was then cut to 22 cm in total length and advanced through the peel-away sheath. The catheter tip was positioned at the cavoatrial junction. The  catheter flushes and aspirates easily. The catheter was capped and flushed The catheter exit sites were secured with a 0-Prolene retention suture. The venotomy incisions were closed with Dermabond. Dressings were applied. The patient tolerated the procedure well without immediate post procedural complication. IMPRESSION: Successful placement of 19 cm tip to cuff tunneled hemodialysis catheter via the right internal jugular vein with tips terminating within the superior aspect of the right atrium. The catheter is ready for immediate use. Successful placement of a tunneled dual lumen power injectable central venous catheter also via the right internal jugular vein. This catheter tip is at the superior cavoatrial junction and is ready for immediate use. Electronically Signed   By: Malachy Moan M.D.   On: 10/16/2022 14:52   IR US Guide Vasc Access Right  Result Date: 10/16/2022 INDICATION: 82 year old female with end-stage renal disease on hemodialysis. She recently suffered from a tunneled catheter site infection requiring line holiday. Now she requires placement of a [name] tunneled HD catheter as well as placement of a tunneled central line for long-term antibiotic therapy. EXAM: TUNNELED CENTRAL VENOUS HEMODIALYSIS CATHETER PLACEMENT WITH ULTRASOUND AND FLUOROSCOPIC GUIDANCE TUNNELED CENTRAL CATHETER PLACEMENT WITH ULTRASOUND AND FLUOROSCOPIC GUIDANCE. MEDICATIONS: In patient receiving antibiotic therapy. No additional prophylaxis given. ANESTHESIA/SEDATION: Moderate (conscious) sedation was employed during this procedure. A total of Versed 0.5 mg and Fentanyl 50 mcg was administered intravenously. Moderate Sedation Time: 27 minutes. The patient's level of consciousness and vital signs were monitored continuously by radiology nursing throughout the procedure under my direct supervision. FLUOROSCOPY TIME:  Radiation exposure index: 1 mGy air kerma COMPLICATIONS: None immediate. PROCEDURE: Informed written  consent was obtained from the patient after a discussion of the risks, benefits, and alternatives to treatment. Questions regarding the procedure were encouraged and answered. The right neck and chest were prepped with chlorhexidine in a sterile fashion, and a sterile drape was applied covering the operative field. Maximum barrier sterile technique with sterile gowns and gloves were used for the procedure. A timeout was performed prior to the initiation of the procedure. After creating a small venotomy incision, a micropuncture kit was utilized to access the right internal jugular vein under direct, real-time ultrasound guidance after the overlying soft tissues were anesthetized with 1% lidocaine with epinephrine. Ultrasound image documentation was performed. The microwire was kinked to measure appropriate catheter length. A stiff Glidewire was advanced to the level of the IVC and the micropuncture sheath was exchanged for a peel-away sheath. A palindrome tunneled hemodialysis catheter measuring 19 cm from tip to cuff was tunneled in a retrograde fashion from the anterior chest wall to the venotomy incision. The catheter was then placed through the peel-away sheath with tips ultimately positioned within the superior aspect of the right atrium. Final catheter positioning was confirmed and documented with a spot radiographic image. The catheter aspirates and flushes normally. The catheter was flushed with appropriate volume heparin dwells. The right internal jugular vein was interrogated with ultrasound and found to be patent with some nonocclusive thrombus. An image was obtained and stored for the medical record. Local anesthesia was attained by infiltration with 1% lidocaine. A small dermatotomy was  made. Under real-time sonographic guidance, the vessel was punctured with a 21 gauge micropuncture needle. Using standard technique, the initial micro needle was exchanged over a 0.018 micro wire for a transitional 4  [place] micro sheath. A peel-away sheath was then advanced over the wire. A suitable skin exit site inferior to the clavicle was selected and anesthetized with 1% lidocaine. A small dermatotomy was made. The dual lumen tunneled power line was then tunneled from the skin exit site to the dermatotomy overlying the [name] venous access site. The catheter was then cut to 22 cm in total length and advanced through the peel-away sheath. The catheter tip was positioned at the cavoatrial junction. The catheter flushes and aspirates easily. The catheter was capped and flushed The catheter exit sites were secured with a 0-Prolene retention suture. The venotomy incisions were closed with Dermabond. Dressings were applied. The patient tolerated the procedure well without immediate post procedural complication. IMPRESSION: Successful placement of 19 cm tip to cuff tunneled hemodialysis catheter via the right internal jugular vein with tips terminating within the superior aspect of the right atrium. The catheter is ready for immediate use. Successful placement of a tunneled dual lumen power injectable central venous catheter also via the right internal jugular vein. This catheter tip is at the superior cavoatrial junction and is ready for immediate use. Electronically Signed   By: Malachy Moan M.D.   On: 10/16/2022 14:52   IR Removal Tun Cv Cath W/O FL  Result Date: 10/12/2022 INDICATION: Patient with history of ESRD on HD via right IJ HD catheter placed 09/18/22 in IR. Admitted with bacteremia. Request for removal of tunneled HD catheter for line holiday. EXAM: REMOVAL OF TUNNELED HEMODIALYSIS CATHETER MEDICATIONS: None COMPLICATIONS: None immediate. PROCEDURE: Informed written consent was obtained from the patient following an explanation of the procedure, risks, benefits and alternatives to treatment. A time out was performed prior to the initiation of the procedure. Maximal barrier sterile technique was utilized including  caps, mask, sterile gowns, sterile gloves, large sterile drape, hand hygiene, and ChloraPrep. Utilizing gentle traction, the catheter was removed intact. Hemostasis was obtained with manual compression. A dressing was placed. The patient tolerated the procedure well without immediate post procedural complication. IMPRESSION: Successful removal of tunneled dialysis catheter. Performed by Lynnette Caffey, PA-C Electronically Signed   By: Acquanetta Belling M.D.   On: 10/12/2022 14:08   DG CHEST PORT 1 VIEW  Result Date: 10/11/2022 CLINICAL DATA:  Follow-up pulmonary edema EXAM: PORTABLE CHEST 1 VIEW COMPARISON:  10/08/2022 FINDINGS: Cardiomegaly with mild interstitial edema, similar. Small bilateral pleural effusions, left greater than right, grossly unchanged. No pneumothorax. Right IJ dual lumen dialysis catheter terminating at the cavoatrial junction. IMPRESSION: Cardiomegaly with mild interstitial edema and small bilateral pleural effusions, grossly unchanged. Electronically Signed   By: Charline Bills M.D.   On: 10/11/2022 17:58   DG CHEST PORT 1 VIEW  Result Date: 10/08/2022 CLINICAL DATA:  Status post CPR EXAM: PORTABLE CHEST 1 VIEW COMPARISON:  Chest radiograph dated 10/07/2022 FINDINGS: Lines/tubes: Right internal jugular venous catheter tip projects over the right atrium. Lungs: Low lung volumes with increased bilateral interstitial and left-greater-than-right patchy opacities. Pleura: Small bilateral pleural effusions.  No pneumothorax. Heart/mediastinum: Similar enlarged cardiomediastinal silhouette. Surgical clips project over the upper right mediastinum. Bones: Unchanged right lateral second and third rib fractures. Additional rib fractures are better evaluated on prior CT. IMPRESSION: 1. Low lung volumes with increased bilateral interstitial and left-greater-than-right patchy opacities, which may represent a combination of  pulmonary edema, atelectasis, or superimposed aspiration/pneumonia. 2.  Small bilateral pleural effusions. Electronically Signed   By: Agustin Cree M.D.   On: 10/08/2022 09:06   CT Chest Wo Contrast  Result Date: 10/07/2022 CLINICAL DATA:  Chest pain, status post cardiac arrest with CPR EXAM: CT CHEST WITHOUT CONTRAST TECHNIQUE: Multidetector CT imaging of the chest was performed following the standard protocol without IV contrast. RADIATION DOSE REDUCTION: This exam was performed according to the departmental dose-optimization program which includes automated exposure control, adjustment of the mA and/or kV according to patient size and/or use of iterative reconstruction technique. COMPARISON:  Chest radiograph dated 10/07/2022. CT chest dated 07/20/2022. FINDINGS: Cardiovascular: Cardiomegaly ventricular hypertrophy and associated wall calcification. No pericardial effusion. Hypodense blood pool relative to myocardium, suggesting anemia. No evidence of thoracic aortic aneurysm. Atherosclerotic calcifications of the aortic arch. Moderate 3 vessel coronary atherosclerosis. Right IJ dual lumen dialysis catheter terminating in the upper right atrium. Mediastinum/Nodes: No suspicious mediastinal lymphadenopathy. High density in the distal esophagus (series 3/images 91 and 120) are presumed to reflect ingested debris. Visualized thyroid is unremarkable. Lungs/Pleura: Moderate left and small right pleural effusions. Associated compressive atelectasis in the bilateral lower lobes. Mild patchy opacities in the posterior upper lobes, likely atelectasis. Technically speaking, superimposed mild multifocal aspiration is possible. Platelike scarring in the right middle lobe. Mild linear scarring/atelectasis in the lingula. Mild loculated fluid along the right major fissure (for example, series 5/image 57). Stable 5 x 3 mm elongated nodule in the right middle lobe (series 5/image 81), benign. No follow-up is required per Fleischner Society guidelines. No pneumothorax. Upper Abdomen: Visualized upper  abdomen is notable for a tiny hiatal hernia. Musculoskeletal: Nondisplaced right lateral 2nd and 3rd rib fractures. Nondisplaced left anterolateral 3rd and 4th rib fractures. These are presumably related to resuscitation. Mild superior endplate compression fracture deformity at T11, chronic. IMPRESSION: Nondisplaced right 2nd, bilateral 3rd, and left 4th rib fractures, presumably related to resuscitation. No pneumothorax. Moderate left and small right pleural effusions. Associated compressive atelectasis in the bilateral lower lobes. Superimposed mild dependent patchy opacities in the lungs bilaterally. Technically speaking, mild aspiration is possible. Additional ancillary findings as above. Aortic Atherosclerosis (ICD10-I70.0). Electronically Signed   By: Charline Bills M.D.   On: 10/07/2022 19:13   CT Head Wo Contrast  Result Date: 10/07/2022 CLINICAL DATA:  Altered mental status EXAM: CT HEAD WITHOUT CONTRAST TECHNIQUE: Contiguous axial images were obtained from the base of the skull through the vertex without intravenous contrast. RADIATION DOSE REDUCTION: This exam was performed according to the departmental dose-optimization program which includes automated exposure control, adjustment of the mA and/or kV according to patient size and/or use of iterative reconstruction technique. COMPARISON:  MRI brain dated 09/06/2022 FINDINGS: Brain: No evidence of acute infarction, hemorrhage, hydrocephalus, extra-axial collection or mass lesion/mass effect. 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 Subcortical white matter and periventricular small vessel ischemic changes. Vascular: Intracranial atherosclerosis. Skull: Normal. Negative for fracture or focal lesion. Sinuses/Orbits: The visualized paranasal sinuses are essentially clear. The mastoid air cells are unopacified. Other: None. IMPRESSION: No acute intracranial abnormality. Chronic left  temporal encephalomalacia. Small vessel ischemic changes. Electronically Signed   By: Charline Bills M.D.   On: 10/07/2022 19:00   ECHOCARDIOGRAM COMPLETE  Result Date: 10/07/2022    ECHOCARDIOGRAM REPORT   Patient Name:   [name] [name] Date of Exam: 10/07/2022 Medical Rec #:  [id number]         Height:  69.0 in Accession #:    [id number]        Weight:       170.4 lb Date of Birth:  11/13/1940        BSA:          1.930 m Patient Age:    81 years          BP:           125/81 mmHg Patient Gender: F                 HR:           138 bpm. Exam Location:  Inpatient Procedure: 2D Echo, Cardiac Doppler, Color Doppler and Intracardiac            Opacification Agent STAT ECHO Indications:    Cardiac Arrest  History:        Patient has prior history of Echocardiogram examinations, most                 recent 08/05/2022. CHF, CAD, CKD and Stroke, Arrythmias:Atrial                 Fibrillation; Risk Factors:Hypertension, Dyslipidemia and                 Diabetes.  Sonographer:    Wallie Char Referring Phys: [id number] Steffanie Dunn  Sonographer Comments: Image acquisition challenging due to patient body habitus. Messaged MD @ 3:15pm IMPRESSIONS  1. Left ventricular ejection fraction, by estimation, is 25 to 30%. The left ventricle has severely decreased function. The left ventricle demonstrates global hypokinesis. Left ventricular diastolic function could not be evaluated.  2. Right ventricular systolic function was not well visualized. The right ventricular size is normal. There is moderately elevated pulmonary artery systolic pressure. The estimated right ventricular systolic pressure is 57.6 mmHg.  3. The mitral valve is degenerative. Trivial mitral valve regurgitation. No evidence of mitral stenosis. Moderate mitral annular calcification.  4. The aortic valve is tricuspid. Aortic valve regurgitation is not visualized. Aortic valve sclerosis/calcification is present, without any evidence of aortic stenosis.  Aortic valve area, by VTI measures 2.52 cm. Aortic valve mean gradient measures 3.0 mmHg. Aortic valve Vmax measures 1.06 m/s.  5. The inferior vena cava is dilated in size with >50% respiratory variability, suggesting right atrial pressure of 8 mmHg. FINDINGS  Left Ventricle: Left ventricular ejection fraction, by estimation, is 25 to 30%. The left ventricle has severely decreased function. The left ventricle demonstrates global hypokinesis. Definity contrast agent was given IV to delineate the left ventricular endocardial borders. The left ventricular internal cavity size was normal in size. There is no left ventricular hypertrophy. Left ventricular diastolic function could not be evaluated due to atrial fibrillation. Left ventricular diastolic function could not be evaluated. Right Ventricle: The right ventricular size is normal. No increase in right ventricular wall thickness. Right ventricular systolic function was not well visualized. There is moderately elevated pulmonary artery systolic pressure. The tricuspid regurgitant velocity is 3.52 m/s, and with an assumed right atrial pressure of 8 mmHg, the estimated right ventricular systolic pressure is 57.6 mmHg. Left Atrium: Left atrial size was normal in size. Right Atrium: Right atrial size was normal in size. Pericardium: There is no evidence of pericardial effusion. Mitral Valve: The mitral valve is degenerative in appearance. There is moderate thickening of the mitral valve leaflet(s). There is moderate calcification of the mitral valve leaflet(s). Moderate mitral annular calcification. Trivial mitral valve regurgitation. No evidence  of mitral valve stenosis. MV peak gradient, 5.3 mmHg. The mean mitral valve gradient is 3.0 mmHg. Tricuspid Valve: The tricuspid valve is normal in structure. Tricuspid valve regurgitation is mild . No evidence of tricuspid stenosis. Aortic Valve: The aortic valve is tricuspid. Aortic valve regurgitation is not visualized.  Aortic valve sclerosis/calcification is present, without any evidence of aortic stenosis. Aortic valve mean gradient measures 3.0 mmHg. Aortic valve peak gradient measures 4.5 mmHg. Aortic valve area, by VTI measures 2.52 cm. Pulmonic Valve: The pulmonic valve was normal in structure. Pulmonic valve regurgitation is not visualized. No evidence of pulmonic stenosis. Aorta: The aortic root is normal in size and structure. Venous: The inferior vena cava is dilated in size with greater than 50% respiratory variability, suggesting right atrial pressure of 8 mmHg. IAS/Shunts: No atrial level shunt detected by color flow Doppler.  LEFT VENTRICLE PLAX 2D LVIDd:         4.10 cm      Diastology LVIDs:         3.80 cm      LV e' medial:  5.80 cm/s LV PW:         0.80 cm      LV e' lateral: 11.40 cm/s LV IVS:        0.80 cm LVOT diam:     1.90 cm LV SV:         46 LV SV Index:   24 LVOT Area:     2.84 cm  LV Volumes (MOD) LV vol d, MOD A2C: 142.0 ml LV vol d, MOD A4C: 83.1 ml LV vol s, MOD A2C: 91.6 ml LV vol s, MOD A4C: 58.4 ml LV SV MOD A2C:     50.4 ml LV SV MOD A4C:     83.1 ml LV SV MOD BP:      37.1 ml RIGHT VENTRICLE             IVC RV Basal diam:  3.10 cm     IVC diam: 2.10 cm RV S prime:     13.20 cm/s TAPSE (M-mode): 1.1 cm LEFT ATRIUM             Index        RIGHT ATRIUM           Index LA diam:        3.50 cm 1.81 cm/m   RA Area:     11.30 cm LA Vol (A2C):   37.9 ml 19.64 ml/m  RA Volume:   20.20 ml  10.47 ml/m LA Vol (A4C):   52.8 ml 27.36 ml/m LA Biplane Vol: 47.2 ml 24.45 ml/m  AORTIC VALVE AV Area (Vmax):    2.70 cm AV Area (Vmean):   2.17 cm AV Area (VTI):     2.52 cm AV Vmax:           105.50 cm/s AV Vmean:          84.950 cm/s AV VTI:            0.182 m AV Peak Grad:      4.5 mmHg AV Mean Grad:      3.0 mmHg LVOT Vmax:         100.40 cm/s LVOT Vmean:        65.100 cm/s LVOT VTI:          0.161 m LVOT/AV VTI ratio: 0.89  AORTA Ao Root diam: 3.30 cm Ao Asc diam:  3.30 cm MITRAL VALVE  TRICUSPID VALVE MV Area (PHT): 5.64 cm   TV Peak grad:   54.5 mmHg MV Area VTI:   3.52 cm   TV Vmax:        3.69 m/s MV Peak grad:  5.3 mmHg   TR Peak grad:   49.6 mmHg MV Mean grad:  3.0 mmHg   TR Vmax:        352.00 cm/s MV Vmax:       1.15 m/s MV Vmean:      78.2 cm/s  SHUNTS                           Systemic VTI:  0.16 m                           Systemic Diam: 1.90 cm Armanda Magic MD Electronically signed by Armanda Magic MD Signature Date/Time: 10/07/2022/3:33:15 PM    Final    DG Chest Portable 1 View  Result Date: 10/07/2022 CLINICAL DATA:  chest pain s/p CPR cardiac arrest EXAM: PORTABLE CHEST 1 VIEW COMPARISON:  09/11/2022. FINDINGS: There are findings suggestive of congestive heart failure/pulmonary edema, grossly similar to the prior study. Redemonstration of left retrocardiac airspace opacity obscuring the left hemidiaphragm, descending thoracic aorta and blunting the left lateral costophrenic angle suggesting combination of left lower lobe atelectasis and/or consolidation with pleural effusion. No significant interval change. Right lateral costophrenic angle is clear. Stable cardio-mediastinal silhouette. No acute osseous abnormalities. The soft tissues are within normal limits. Right IJ hemodialysis catheter noted with its tip overlying the cavoatrial junction region. IMPRESSION: 1. Congestive heart failure/pulmonary edema. 2. Left lower lobe atelectasis and/or consolidation with pleural effusion. Electronically Signed   By: Jules Schick M.D.   On: 10/07/2022 14:39     Subjective: Patient seen examined at bedside, resting comfortably.  Sitting in bedside chair.  Eating breakfast.  PICC line removed by IR yesterday.  No specific complaints.  Discussed with nephrology, Dr. Malen Gauze, will get HD today and okay for discharge home.  Patient denies headache, no dizziness, no chest pain, no palpitations, no shortness of breath, no abdominal pain, no fever/chills/night sweats, no  nausea/vomiting/diarrhea, no focal weakness, no fatigue, no paresthesias.  No acute events overnight per nursing staff.  Discharging back to SNF after HD today  Discharge Exam: Vitals:   10/30/22 0528 10/30/22 0736  BP: (!) 139/55 (!) 120/52  Pulse: 68   Resp: 14 16  Temp: (!) 97.5 F (36.4 C) (!) 97.3 F (36.3 C)  SpO2: 100% 100%   Vitals:   10/29/22 2035 10/30/22 0032 10/30/22 0528 10/30/22 0736  BP: 122/64 (!) 118/52 (!) 139/55 (!) 120/52  Pulse: 69 65 68   Resp: 19 14 14 16   Temp: 97.6 F (36.4 C) (!) 97.5 F (36.4 C) (!) 97.5 F (36.4 C) (!) 97.3 F (36.3 C)  TempSrc: Oral Oral Axillary Axillary  SpO2: 95% 100% 100% 100%  Weight:   72.1 kg   Height:        Physical Exam: GEN: NAD, alert and oriented x 3, chronically ill in appearance HEENT: NCAT, PERRL, EOMI, sclera clear, MMM PULM: CTAB w/o wheezes/crackles, normal respiratory effort, on room air CV: RRR w/o M/G/R, noted tunneled HD catheter right chest GI: abd soft, NTND, NABS, no R/G/M MSK: no peripheral edema, moves all extremities independently NEURO: CN II-XII intact, no focal deficits, sensation to light touch intact PSYCH: normal mood/affect Integumentary:  No concerning rashes/lesions/wounds noted on exposed skin surfaces    The results of significant diagnostics from this hospitalization (including imaging, microbiology, ancillary and laboratory) are listed below for reference.     Microbiology: Recent Results (from the past 240 hour(s))  Blood culture (routine x 2)     Status: None (Preliminary result)   Collection Time: 10/26/22  3:41 AM   Specimen: BLOOD  Result Value Ref Range Status   Specimen Description BLOOD BLOOD LEFT WRIST  Final   Special Requests   Final    BOTTLES DRAWN AEROBIC AND ANAEROBIC Blood Culture results may not be optimal due to an excessive volume of blood received in culture bottles   Culture   Final    NO GROWTH 4 DAYS Performed at [name], [address], [place], [place] [address]    Report Status PENDING  Incomplete  Blood culture (routine x 2)     Status: None (Preliminary result)   Collection Time: 10/26/22  4:19 AM   Specimen: BLOOD  Result Value Ref Range Status   Specimen Description BLOOD BLOOD RIGHT HAND  Final   Special Requests   Final    BOTTLES DRAWN AEROBIC AND ANAEROBIC Blood Culture adequate volume   Culture   Final    NO GROWTH 4 DAYS Performed at [facility], [address], [place], [place] [address]    Report Status PENDING  Incomplete  MRSA Next Gen by PCR, Nasal     Status: None   Collection Time: 10/26/22  9:21 AM   Specimen: Nasal Mucosa; Nasal Swab  Result Value Ref Range Status   MRSA by PCR Next Gen NOT DETECTED NOT DETECTED Final    Comment: (NOTE) The GeneXpert MRSA Assay (FDA approved for NASAL specimens only), is one component of a comprehensive MRSA colonization surveillance program. It is not intended to diagnose MRSA infection nor to guide or monitor treatment for MRSA infections. Test performance is not FDA approved in patients less than 1 years old. Performed at [facility], [address] [place], [place] [address]   Resp panel by RT-PCR (RSV, Flu A&B, Covid) Anterior Nasal Swab     Status: Abnormal   Collection Time: 10/26/22  9:39 AM   Specimen: Anterior Nasal Swab  Result Value Ref Range Status   SARS Coronavirus 2 by RT PCR POSITIVE (A) NEGATIVE Final   Influenza A by PCR NEGATIVE NEGATIVE Final   Influenza B by PCR NEGATIVE NEGATIVE Final    Comment: (NOTE) The Xpert Xpress SARS-CoV-2/FLU/RSV plus assay is intended as an aid in the diagnosis of influenza from Nasopharyngeal swab specimens and should not be used as a sole basis for treatment. Nasal washings and aspirates are unacceptable for Xpert Xpress SARS-CoV-2/FLU/RSV testing.  Fact Sheet for Patients: BloggerCourse.com  Fact Sheet for Healthcare  Providers: SeriousBroker.it  This test is not yet approved or cleared by the [place] FDA and has been authorized for detection and/or diagnosis of SARS-CoV-2 by FDA under an Emergency Use Authorization (EUA). This EUA will remain in effect (meaning this test can be used) for the duration of the COVID-19 declaration under Section 564(b)(1) of the Act, 21 U.S.C. section 360bbb-3(b)(1), unless the authorization is terminated or revoked.     Resp Syncytial Virus by PCR NEGATIVE NEGATIVE Final    Comment: (NOTE) Fact Sheet for Patients: BloggerCourse.com  Fact Sheet for Healthcare Providers: SeriousBroker.it  This test is not yet approved or cleared by the [place] FDA and has been authorized for detection and/or diagnosis of SARS-CoV-2 by FDA under an Emergency Use Authorization (EUA). This EUA will remain in effect (meaning this test can be used) for the duration of the COVID-19 declaration under Section 564(b)(1) of the Act, 21 U.S.C. section 360bbb-3(b)(1), unless the authorization is terminated or revoked.  Performed at [facility], [address], [place], [place] [address]      Labs: BNP (last 3 results) Recent Labs    08/03/22 1710 10/07/22 1333 10/26/22 0313  BNP 1,094.9* 1,531.4* 1,951.8*   Basic Metabolic Panel: Recent Labs  Lab 10/26/22 0313 10/27/22 0527 10/28/22 0355 10/29/22 0321 10/30/22 0720  NA 132* 131* 134* 131* 133*  K 4.0 3.8 4.1 4.3 4.9  CL 94* 96* 98 93* 94*  CO2 27 28 28 26 29   GLUCOSE 124* 98 112* 113* 93  BUN 16 8 16  26* 38*  CREATININE 2.13* 1.41* 1.86* 2.30* 2.76*  CALCIUM 8.5* 7.9* 8.2* 8.3* 8.6*  PHOS  --  2.5 3.0 3.5 3.8   Liver Function Tests: Recent Labs  Lab 10/26/22 0313 10/27/22 0527 10/28/22 0355 10/29/22 0321 10/30/22 0720  AST 40  --   --   --   --   ALT 12  --   --   --   --   ALKPHOS 190*  --   --   --   --   BILITOT  0.5  --   --   --   --   PROT 6.2*  --   --   --   --   ALBUMIN 1.9* 1.5* <1.5* 1.5* 1.6*   No results for input(s): "LIPASE", "AMYLASE" in the last 168 hours. No results for input(s): "AMMONIA" in the last 168 hours. CBC: Recent Labs  Lab 10/26/22 0313 10/27/22 0527 10/28/22 0355 10/30/22 0720  WBC 12.1* 7.9 7.5 8.4  NEUTROABS 10.2*  --   --   --   HGB 9.6* 8.7* 8.7* 10.9*  HCT 31.7* 27.5* 27.8* 34.0*  MCV 94.9 92.9 92.7 89.7  PLT 319 272 312 404*   Cardiac Enzymes: No results for input(s): "CKTOTAL", "CKMB", "CKMBINDEX", "TROPONINI" in the last 168 hours. BNP: Invalid input(s): "POCBNP" CBG: Recent Labs  Lab 10/29/22 0600 10/29/22 1050 10/29/22 1544 10/29/22 2117 10/30/22 0615  GLUCAP 108* 239* 239* 117* 86   D-Dimer No results for input(s): "DDIMER" in the last 72 hours. Hgb A1c No results for input(s): "HGBA1C" in the last 72 hours. Lipid Profile No results for input(s): "CHOL", "HDL", "LDLCALC", "TRIG", "CHOLHDL", "LDLDIRECT" in the last 72 hours. Thyroid function studies No results for input(s): "TSH", "T4TOTAL", "T3FREE", "THYROIDAB" in the last 72 hours.  Invalid input(s): "FREET3" Anemia work up No results for input(s): "VITAMINB12", "FOLATE", "FERRITIN", "TIBC", "IRON", "RETICCTPCT" in the last 72 hours. Urinalysis    Component Value Date/Time   COLORURINE YELLOW 10/27/2022 0946   APPEARANCEUR CLEAR 10/27/2022 0946   LABSPEC 1.010 10/27/2022 0946   PHURINE 7.0 10/27/2022 0946   GLUCOSEU NEGATIVE 10/27/2022 0946   GLUCOSEU NEGATIVE 05/06/2022 1103   HGBUR LARGE (A) 10/27/2022 0946   BILIRUBINUR NEGATIVE 10/27/2022 0946   BILIRUBINUR neg 05/16/2015 1928   KETONESUR NEGATIVE 10/27/2022 0946   PROTEINUR 100 (A) 10/27/2022 0946   UROBILINOGEN 0.2 05/06/2022 1103   NITRITE NEGATIVE 10/27/2022 0946   LEUKOCYTESUR MODERATE (A) 10/27/2022 0946   Sepsis Labs Recent Labs  Lab 10/26/22 0313 10/27/22 0527 10/28/22 0355 10/30/22 0720  WBC 12.1* 7.9  7.5 8.4  Microbiology Recent Results (from the past 240 hour(s))  Blood culture (routine x 2)     Status: None (Preliminary result)   Collection Time: 10/26/22  3:41 AM   Specimen: BLOOD  Result Value Ref Range Status   Specimen Description BLOOD BLOOD LEFT WRIST  Final   Special Requests   Final    BOTTLES DRAWN AEROBIC AND ANAEROBIC Blood Culture results may not be optimal due to an excessive volume of blood received in culture bottles   Culture   Final    NO GROWTH 4 DAYS Performed at [facility], [address] [place], [place] [address]    Report Status PENDING  Incomplete  Blood culture (routine x 2)     Status: None (Preliminary result)   Collection Time: 10/26/22  4:19 AM   Specimen: BLOOD  Result Value Ref Range Status   Specimen Description BLOOD BLOOD RIGHT HAND  Final   Special Requests   Final    BOTTLES DRAWN AEROBIC AND ANAEROBIC Blood Culture adequate volume   Culture   Final    NO GROWTH 4 DAYS Performed at [facility], [address], [place], [place] [address]    Report Status PENDING  Incomplete  MRSA Next Gen by PCR, Nasal     Status: None   Collection Time: 10/26/22  9:21 AM   Specimen: Nasal Mucosa; Nasal Swab  Result Value Ref Range Status   MRSA by PCR Next Gen NOT DETECTED NOT DETECTED Final    Comment: (NOTE) The GeneXpert MRSA Assay (FDA approved for NASAL specimens only), is one component of a comprehensive MRSA colonization surveillance program. It is not intended to diagnose MRSA infection nor to guide or monitor treatment for MRSA infections. Test performance is not FDA approved in patients less than 7 years old. Performed at [facility], [address], [place], [place] [address]   Resp panel by RT-PCR (RSV, Flu A&B, Covid) Anterior Nasal Swab     Status: Abnormal   Collection Time: 10/26/22  9:39 AM   Specimen: Anterior Nasal Swab  Result Value Ref Range Status   SARS Coronavirus 2 by RT PCR POSITIVE (A) NEGATIVE  Final   Influenza A by PCR NEGATIVE NEGATIVE Final   Influenza B by PCR NEGATIVE NEGATIVE Final    Comment: (NOTE) The Xpert Xpress SARS-CoV-2/FLU/RSV plus assay is intended as an aid in the diagnosis of influenza from Nasopharyngeal swab specimens and should not be used as a sole basis for treatment. Nasal washings and aspirates are unacceptable for Xpert Xpress SARS-CoV-2/FLU/RSV testing.  Fact Sheet for Patients: BloggerCourse.com  Fact Sheet for Healthcare Providers: SeriousBroker.it  This test is not yet approved or cleared by the [place] FDA and has been authorized for detection and/or diagnosis of SARS-CoV-2 by FDA under an Emergency Use Authorization (EUA). This EUA will remain in effect (meaning this test can be used) for the duration of the COVID-19 declaration under Section 564(b)(1) of the Act, 21 U.S.C. section 360bbb-3(b)(1), unless the authorization is terminated or revoked.     Resp Syncytial Virus by PCR NEGATIVE NEGATIVE Final    Comment: (NOTE) Fact Sheet for Patients: BloggerCourse.com  Fact Sheet for Healthcare Providers: SeriousBroker.it  This test is not yet approved or cleared by the [place] FDA and has been authorized for detection and/or diagnosis of SARS-CoV-2 by FDA under an Emergency Use Authorization (EUA). This EUA will remain in effect (meaning this test can be used) for the duration  of the COVID-19 declaration under Section 564(b)(1) of the Act, 21 U.S.C. section 360bbb-3(b)(1), unless the authorization is terminated or revoked.  Performed at [facility], [address], [place], [place] [address]      Time coordinating discharge: Over 30 minutes  SIGNED:   Alvira Philips [place], DO  [facility] 10/30/2022, 9:56 AM

## 2022-10-30 NOTE — Progress Notes (Addendum)
Contacted by nephrologist with request for pt to receive out-pt HD later today or tomorrow. Contacted [facility] and clinic does not have any availability for later today or tomorrow. Update provided to nephrologist and attending. Pt should return to snf at d/c per CSW note. Will assist as needed.   Olivia Canter Renal Navigator [phone]  Addendum at 11:27 am: Pt to d/c to snf today. Contacted [facility] to advise clinic of pt's d/c today and that pt should resume care on Monday. Clinic aware pt tested positive for covid.

## 2022-10-31 LAB — CULTURE, BLOOD (ROUTINE X 2)
Culture: NO GROWTH
Culture: NO GROWTH
Special Requests: ADEQUATE

## 2022-10-31 NOTE — Discharge Planning (Signed)
[facility] Patient Discharge Orders- [facility] CLINIC: [facility]  Patient's name: [name] Admit/DC Dates: 10/26/2022 - 10/30/2022  Discharge Diagnoses: AMS/encephalopathy ? 2/2 COVID  COVID Dialysis dependent AKI - "Nonoliguric and she appears to have some evidence of renal recovery; however, she does have continued rise in creatinine in between HD sessions and presented with overload despite HD and is 82 years old. For now continue with outpatient HD - we will monitor for renal recovery outpatient.  Hopeful to get off of HD in the next couple of weeks"  Aranesp: Given: yes   Date and amount of last dose: on 10/27/22  Last Hgb: 10.9 PRBC's Given: no  ESA dose for discharge: mircera 75 mcg IV q 2 weeks next 11/03/22 IV Iron dose at discharge: no  Heparin change: no  EDW Change: yes New EDW: 73kg  Bath Change: no  Access intervention/Change: no Details:  Hectorol/Calcitriol change: no  Discharge Labs: Calcium 8.6 Phosphorus 3.8 Albumin 1.6 K+ 4.9  Start ONSP if not on already  IV Antibiotics: no Details:  On Coumadin?: no, Eliquis Last INR: Next INR: Managed By:   OTHER/APPTS/LAB ORDERS: On Lasix 80mg  PO daily    D/C Meds to be reconciled by nurse after every discharge.  Completed By:   Reviewed by: MD:______ RN_______  Virgina Norfolk, PA-C [facility]

## 2022-10-31 NOTE — Progress Notes (Signed)
Pt d/c to [facility] via [facility]. Belonging including dentures, glasses and clothes sent with pt. Pt asked about a blanket green. Not located in room. Husband had picked up belonging earlier in the day.

## 2022-11-05 ENCOUNTER — Ambulatory Visit: Payer: Medicare Other | Admitting: Internal Medicine

## 2022-11-05 ENCOUNTER — Other Ambulatory Visit: Payer: Self-pay | Admitting: *Deleted

## 2022-11-05 NOTE — Patient Outreach (Signed)
Post- Acute Care Coordinator follow up. Mrs. Sehnert resides in [facility] skilled nursing facility. Mrs. Cornier is active with care coordination team.    Collaboration with therapy manager and social work team. Sales promotion account executive reports Mrs. Urzua has had medical issues that warranted recent hospital readmissions. Mrs. Trimbur needs more time with therapy. Transition date not set yet.   Will update care coordination team.    Raiford Noble, MSN, RN, BSN [place]  [facility], [facility] RN Post- Acute Care Coordinator Direct Dial: [phone]

## 2022-11-19 ENCOUNTER — Other Ambulatory Visit: Payer: Self-pay | Admitting: *Deleted

## 2022-11-19 NOTE — Patient Outreach (Signed)
Post- Acute Care Coordinator follow up. Mrs. [name] [name] resides in [facility] skilled nursing facility. Mrs. [name] [name] has been active with care coordination/chronic care management team.   Collaboration with [facility] therapy manager and social work team. Mrs. [name] [name] has been complaining of extreme hip pain. Hip xray negative for fracture. PRN pain medication adjustments made.   Will continue to follow. Will update care coordination/care management team.   Raiford Noble, MSN, RN, BSN [place]  [facility], [facility] RN Post- Acute Care Coordinator Direct Dial: [phone]

## 2022-11-22 ENCOUNTER — Emergency Department (HOSPITAL_COMMUNITY): Payer: Medicare Other

## 2022-11-22 ENCOUNTER — Inpatient Hospital Stay (HOSPITAL_COMMUNITY)
Admission: EM | Admit: 2022-11-22 | Discharge: 2022-11-27 | DRG: 871 | Disposition: A | Discharge status: Hospice | Payer: Medicare Other | Source: Skilled Nursing Facility | Attending: Family Medicine | Admitting: Family Medicine

## 2022-11-22 ENCOUNTER — Other Ambulatory Visit: Payer: Self-pay

## 2022-11-22 ENCOUNTER — Encounter (HOSPITAL_COMMUNITY): Payer: Self-pay

## 2022-11-22 DIAGNOSIS — E1165 Type 2 diabetes mellitus with hyperglycemia: Secondary | ICD-10-CM | POA: Diagnosis present

## 2022-11-22 DIAGNOSIS — H5462 Unqualified visual loss, left eye, normal vision right eye: Secondary | ICD-10-CM | POA: Diagnosis present

## 2022-11-22 DIAGNOSIS — N179 Acute kidney failure, unspecified: Secondary | ICD-10-CM | POA: Diagnosis present

## 2022-11-22 DIAGNOSIS — E038 Other specified hypothyroidism: Secondary | ICD-10-CM | POA: Diagnosis present

## 2022-11-22 DIAGNOSIS — Z87891 Personal history of nicotine dependence: Secondary | ICD-10-CM

## 2022-11-22 DIAGNOSIS — Z6823 Body mass index (BMI) 23.0-23.9, adult: Secondary | ICD-10-CM

## 2022-11-22 DIAGNOSIS — I480 Paroxysmal atrial fibrillation: Secondary | ICD-10-CM | POA: Diagnosis present

## 2022-11-22 DIAGNOSIS — Z8674 Personal history of sudden cardiac arrest: Secondary | ICD-10-CM

## 2022-11-22 DIAGNOSIS — I2510 Atherosclerotic heart disease of native coronary artery without angina pectoris: Secondary | ICD-10-CM | POA: Diagnosis present

## 2022-11-22 DIAGNOSIS — D72829 Elevated white blood cell count, unspecified: Secondary | ICD-10-CM | POA: Diagnosis present

## 2022-11-22 DIAGNOSIS — D494 Neoplasm of unspecified behavior of bladder: Secondary | ICD-10-CM | POA: Diagnosis present

## 2022-11-22 DIAGNOSIS — J9601 Acute respiratory failure with hypoxia: Secondary | ICD-10-CM | POA: Diagnosis not present

## 2022-11-22 DIAGNOSIS — N390 Urinary tract infection, site not specified: Secondary | ICD-10-CM | POA: Diagnosis not present

## 2022-11-22 DIAGNOSIS — N184 Chronic kidney disease, stage 4 (severe): Secondary | ICD-10-CM | POA: Diagnosis present

## 2022-11-22 DIAGNOSIS — R6520 Severe sepsis without septic shock: Secondary | ICD-10-CM | POA: Diagnosis present

## 2022-11-22 DIAGNOSIS — E11649 Type 2 diabetes mellitus with hypoglycemia without coma: Secondary | ICD-10-CM | POA: Diagnosis not present

## 2022-11-22 DIAGNOSIS — Z888 Allergy status to other drugs, medicaments and biological substances status: Secondary | ICD-10-CM

## 2022-11-22 DIAGNOSIS — Z8673 Personal history of transient ischemic attack (TIA), and cerebral infarction without residual deficits: Secondary | ICD-10-CM

## 2022-11-22 DIAGNOSIS — Z8616 Personal history of COVID-19: Secondary | ICD-10-CM

## 2022-11-22 DIAGNOSIS — Z880 Allergy status to penicillin: Secondary | ICD-10-CM

## 2022-11-22 DIAGNOSIS — I132 Hypertensive heart and chronic kidney disease with heart failure and with stage 5 chronic kidney disease, or end stage renal disease: Secondary | ICD-10-CM | POA: Diagnosis present

## 2022-11-22 DIAGNOSIS — A4159 Other Gram-negative sepsis: Principal | ICD-10-CM | POA: Diagnosis present

## 2022-11-22 DIAGNOSIS — Z7901 Long term (current) use of anticoagulants: Secondary | ICD-10-CM

## 2022-11-22 DIAGNOSIS — I5022 Chronic systolic (congestive) heart failure: Secondary | ICD-10-CM | POA: Diagnosis present

## 2022-11-22 DIAGNOSIS — Z8661 Personal history of infections of the central nervous system: Secondary | ICD-10-CM

## 2022-11-22 DIAGNOSIS — E785 Hyperlipidemia, unspecified: Secondary | ICD-10-CM | POA: Diagnosis present

## 2022-11-22 DIAGNOSIS — N189 Chronic kidney disease, unspecified: Secondary | ICD-10-CM

## 2022-11-22 DIAGNOSIS — D631 Anemia in chronic kidney disease: Secondary | ICD-10-CM | POA: Diagnosis present

## 2022-11-22 DIAGNOSIS — N186 End stage renal disease: Secondary | ICD-10-CM | POA: Diagnosis present

## 2022-11-22 DIAGNOSIS — Z1612 Extended spectrum beta lactamase (ESBL) resistance: Secondary | ICD-10-CM | POA: Diagnosis present

## 2022-11-22 DIAGNOSIS — E1122 Type 2 diabetes mellitus with diabetic chronic kidney disease: Secondary | ICD-10-CM | POA: Diagnosis present

## 2022-11-22 DIAGNOSIS — I9589 Other hypotension: Secondary | ICD-10-CM | POA: Diagnosis present

## 2022-11-22 DIAGNOSIS — W19XXXA Unspecified fall, initial encounter: Secondary | ICD-10-CM | POA: Diagnosis present

## 2022-11-22 DIAGNOSIS — U071 COVID-19: Secondary | ICD-10-CM

## 2022-11-22 DIAGNOSIS — K219 Gastro-esophageal reflux disease without esophagitis: Secondary | ICD-10-CM | POA: Diagnosis present

## 2022-11-22 DIAGNOSIS — Z79899 Other long term (current) drug therapy: Secondary | ICD-10-CM

## 2022-11-22 DIAGNOSIS — I953 Hypotension of hemodialysis: Secondary | ICD-10-CM | POA: Diagnosis present

## 2022-11-22 DIAGNOSIS — Z9861 Coronary angioplasty status: Secondary | ICD-10-CM

## 2022-11-22 DIAGNOSIS — Z8249 Family history of ischemic heart disease and other diseases of the circulatory system: Secondary | ICD-10-CM

## 2022-11-22 DIAGNOSIS — Z7983 Long term (current) use of bisphosphonates: Secondary | ICD-10-CM

## 2022-11-22 DIAGNOSIS — A419 Sepsis, unspecified organism: Secondary | ICD-10-CM | POA: Diagnosis present

## 2022-11-22 DIAGNOSIS — Z515 Encounter for palliative care: Secondary | ICD-10-CM

## 2022-11-22 DIAGNOSIS — M791 Myalgia, unspecified site: Secondary | ICD-10-CM

## 2022-11-22 DIAGNOSIS — R627 Adult failure to thrive: Secondary | ICD-10-CM | POA: Diagnosis present

## 2022-11-22 DIAGNOSIS — Z66 Do not resuscitate: Secondary | ICD-10-CM | POA: Diagnosis present

## 2022-11-22 DIAGNOSIS — G9341 Metabolic encephalopathy: Secondary | ICD-10-CM | POA: Diagnosis present

## 2022-11-22 DIAGNOSIS — N136 Pyonephrosis: Secondary | ICD-10-CM | POA: Diagnosis present

## 2022-11-22 DIAGNOSIS — Z833 Family history of diabetes mellitus: Secondary | ICD-10-CM

## 2022-11-22 DIAGNOSIS — R946 Abnormal results of thyroid function studies: Secondary | ICD-10-CM | POA: Diagnosis present

## 2022-11-22 DIAGNOSIS — M4856XA Collapsed vertebra, not elsewhere classified, lumbar region, initial encounter for fracture: Secondary | ICD-10-CM | POA: Diagnosis present

## 2022-11-22 DIAGNOSIS — Z794 Long term (current) use of insulin: Secondary | ICD-10-CM

## 2022-11-22 DIAGNOSIS — Z992 Dependence on renal dialysis: Secondary | ICD-10-CM

## 2022-11-22 LAB — CK: Total CK: 29 U/L — ABNORMAL LOW (ref 38–234)

## 2022-11-22 LAB — URINALYSIS, W/ REFLEX TO CULTURE (INFECTION SUSPECTED): WBC, UA: >50 WBC/hpf (ref 0–5)

## 2022-11-22 LAB — TSH: TSH: 8.541 u[IU]/mL — ABNORMAL HIGH (ref 0.350–4.500)

## 2022-11-22 LAB — LACTIC ACID, PLASMA
Lactic Acid, Venous: 1.6 mmol/L (ref 0.5–1.9)
Lactic Acid, Venous: 1.7 mmol/L (ref 0.5–1.9)

## 2022-11-22 LAB — LIPASE, BLOOD: Lipase: 19 U/L (ref 11–51)

## 2022-11-22 LAB — CBC WITH DIFFERENTIAL/PLATELET
Abs Immature Granulocytes: 0 10*3/uL (ref 0.00–0.07)
Basophils Absolute: 0 10*3/uL (ref 0.0–0.1)
Basophils Relative: 0 %
Eosinophils Absolute: 0 10*3/uL (ref 0.0–0.5)
Eosinophils Relative: 0 %
HCT: 29.3 % — ABNORMAL LOW (ref 36.0–46.0)
Hemoglobin: 9.2 g/dL — ABNORMAL LOW (ref 12.0–15.0)
Lymphocytes Relative: 5 %
Lymphs Abs: 0.8 10*3/uL (ref 0.7–4.0)
MCH: 28.4 pg (ref 26.0–34.0)
MCHC: 31.4 g/dL (ref 30.0–36.0)
MCV: 90.4 fL (ref 80.0–100.0)
Monocytes Absolute: 0.7 10*3/uL (ref 0.1–1.0)
Monocytes Relative: 4 %
Neutro Abs: 14.8 10*3/uL — ABNORMAL HIGH (ref 1.7–7.7)
Neutrophils Relative %: 91 %
Platelets: 218 10*3/uL (ref 150–400)
RBC: 3.24 MIL/uL — ABNORMAL LOW (ref 3.87–5.11)
RDW: 19.7 % — ABNORMAL HIGH (ref 11.5–15.5)
WBC: 16.3 10*3/uL — ABNORMAL HIGH (ref 4.0–10.5)
nRBC: 0 /100 WBC
nRBC: 0.2 % (ref 0.0–0.2)

## 2022-11-22 LAB — SARS CORONAVIRUS 2 BY RT PCR: SARS Coronavirus 2 by RT PCR: POSITIVE — AB

## 2022-11-22 LAB — TROPONIN I (HIGH SENSITIVITY)
Troponin I (High Sensitivity): 35 ng/L — ABNORMAL HIGH (ref <18)
Troponin I (High Sensitivity): 34 ng/L — ABNORMAL HIGH (ref <18)

## 2022-11-22 LAB — VITAMIN B12: Vitamin B-12: 4144 pg/mL — ABNORMAL HIGH (ref 180–914)

## 2022-11-22 LAB — T4, FREE: Free T4: 0.61 ng/dL (ref 0.61–1.12)

## 2022-11-22 LAB — I-STAT CG4 LACTIC ACID, ED: Lactic Acid, Venous: 2.2 mmol/L (ref 0.5–1.9)

## 2022-11-22 LAB — PHOSPHORUS: Phosphorus: 1.6 mg/dL — ABNORMAL LOW (ref 2.5–4.6)

## 2022-11-22 LAB — COMPREHENSIVE METABOLIC PANEL
ALT: 19 U/L (ref 0–44)
AST: 62 U/L — ABNORMAL HIGH (ref 15–41)
Albumin: <1.5 g/dL — ABNORMAL LOW (ref 3.5–5.0)
Alkaline Phosphatase: 394 U/L — ABNORMAL HIGH (ref 38–126)
Anion gap: 12 (ref 5–15)
BUN: 32 mg/dL — ABNORMAL HIGH (ref 8–23)
CO2: 27 mmol/L (ref 22–32)
Calcium: 7.5 mg/dL — ABNORMAL LOW (ref 8.9–10.3)
Chloride: 93 mmol/L — ABNORMAL LOW (ref 98–111)
Creatinine, Ser: 2.51 mg/dL — ABNORMAL HIGH (ref 0.44–1.00)
GFR, Estimated: 19 mL/min — ABNORMAL LOW (ref >60)
Glucose, Bld: 166 mg/dL — ABNORMAL HIGH (ref 70–99)
Potassium: 5.4 mmol/L — ABNORMAL HIGH (ref 3.5–5.1)
Sodium: 132 mmol/L — ABNORMAL LOW (ref 135–145)
Total Bilirubin: 1.3 mg/dL — ABNORMAL HIGH (ref 0.3–1.2)
Total Protein: 5 g/dL — ABNORMAL LOW (ref 6.5–8.1)

## 2022-11-22 LAB — GLUCOSE, CAPILLARY: Glucose-Capillary: 157 mg/dL — ABNORMAL HIGH (ref 70–99)

## 2022-11-22 LAB — BRAIN NATRIURETIC PEPTIDE: B Natriuretic Peptide: 1376.1 pg/mL — ABNORMAL HIGH (ref 0.0–100.0)

## 2022-11-22 MED ORDER — CHLORHEXIDINE GLUCONATE CLOTH 2 % EX PADS
6.0000 | MEDICATED_PAD | Freq: Every day | CUTANEOUS | Status: DC
  Administered 2022-11-23 – 2022-11-26 (×5): 6 via TOPICAL

## 2022-11-22 MED ORDER — APIXABAN 2.5 MG PO TABS
2.5000 mg | ORAL_TABLET | Freq: Two times a day (BID) | ORAL | Status: DC
  Administered 2022-11-22 – 2022-11-24 (×4): 2.5 mg via ORAL
  Filled 2022-11-22 (×4): qty 1

## 2022-11-22 MED ORDER — ONDANSETRON HCL 4 MG PO TABS: 4.0000 mg | ORAL_TABLET | Freq: Four times a day (QID) | ORAL | Status: DC | PRN

## 2022-11-22 MED ORDER — AMIODARONE HCL 200 MG PO TABS
200.0000 mg | ORAL_TABLET | Freq: Every day | ORAL | Status: DC
  Administered 2022-11-23 – 2022-11-24 (×2): 200 mg via ORAL
  Filled 2022-11-22 (×2): qty 1

## 2022-11-22 MED ORDER — MORPHINE SULFATE (PF) 4 MG/ML IV SOLN
4.0000 mg | Freq: Once | INTRAVENOUS | Status: AC
  Administered 2022-11-22: 4 mg via INTRAVENOUS
  Filled 2022-11-22: qty 1

## 2022-11-22 MED ORDER — ACETAMINOPHEN 325 MG PO TABS
650.0000 mg | ORAL_TABLET | Freq: Four times a day (QID) | ORAL | Status: DC | PRN
  Administered 2022-11-22: 650 mg via ORAL
  Filled 2022-11-22 (×2): qty 2

## 2022-11-22 MED ORDER — SODIUM CHLORIDE 0.9 % IV SOLN
500.0000 mg | Freq: Two times a day (BID) | INTRAVENOUS | Status: DC
  Administered 2022-11-22 – 2022-11-23 (×3): 500 mg via INTRAVENOUS
  Filled 2022-11-22 (×5): qty 10

## 2022-11-22 MED ORDER — PRO-STAT SUGAR FREE PO LIQD: 30.0000 mL | Freq: Two times a day (BID) | ORAL | Status: DC

## 2022-11-22 MED ORDER — POLYETHYLENE GLYCOL 3350 17 G PO PACK
17.0000 g | PACK | Freq: Every day | ORAL | Status: DC
  Administered 2022-11-23 – 2022-11-27 (×5): 17 g via ORAL
  Filled 2022-11-22 (×3): qty 1

## 2022-11-22 MED ORDER — EZETIMIBE 10 MG PO TABS
10.0000 mg | ORAL_TABLET | Freq: Every day | ORAL | Status: DC
  Administered 2022-11-23 – 2022-11-24 (×2): 10 mg via ORAL
  Filled 2022-11-22 (×2): qty 1

## 2022-11-22 MED ORDER — PANTOPRAZOLE SODIUM 40 MG PO TBEC
40.0000 mg | DELAYED_RELEASE_TABLET | Freq: Every day | ORAL | Status: DC
  Administered 2022-11-23: 40 mg via ORAL
  Filled 2022-11-22 (×2): qty 1

## 2022-11-22 MED ORDER — SODIUM CHLORIDE 0.9% FLUSH
3.0000 mL | INTRAVENOUS | Status: DC | PRN
  Administered 2022-11-26: 3 mL via INTRAVENOUS

## 2022-11-22 MED ORDER — SODIUM CHLORIDE 0.9 % IV SOLN
1.0000 g | Freq: Once | INTRAVENOUS | Status: AC
  Administered 2022-11-22: 1 g via INTRAVENOUS
  Filled 2022-11-22: qty 10

## 2022-11-22 MED ORDER — SODIUM CHLORIDE 0.9% FLUSH
3.0000 mL | Freq: Two times a day (BID) | INTRAVENOUS | Status: DC
  Administered 2022-11-22 – 2022-11-27 (×8): 3 mL via INTRAVENOUS

## 2022-11-22 MED ORDER — SODIUM CHLORIDE 0.9 % IV SOLN: 250.0000 mL | INTRAVENOUS | Status: DC | PRN

## 2022-11-22 MED ORDER — ONDANSETRON HCL 4 MG/2ML IJ SOLN
4.0000 mg | Freq: Four times a day (QID) | INTRAMUSCULAR | Status: DC | PRN
  Administered 2022-11-22: 4 mg via INTRAVENOUS
  Filled 2022-11-22: qty 2

## 2022-11-22 MED ORDER — LACTATED RINGERS IV BOLUS: 500.0000 mL | Freq: Once | INTRAVENOUS | Status: DC

## 2022-11-22 MED ORDER — ACETAMINOPHEN 650 MG RE SUPP: 650.0000 mg | Freq: Four times a day (QID) | RECTAL | Status: DC | PRN

## 2022-11-22 MED ORDER — MIDODRINE HCL 5 MG PO TABS: 5.0000 mg | ORAL_TABLET | Freq: Two times a day (BID) | ORAL | Status: DC

## 2022-11-22 MED ORDER — FUROSEMIDE 40 MG PO TABS
80.0000 mg | ORAL_TABLET | ORAL | Status: DC
  Filled 2022-11-22: qty 2

## 2022-11-22 MED ORDER — INSULIN ASPART 100 UNIT/ML IJ SOLN: 0.0000 [IU] | Freq: Three times a day (TID) | INTRAMUSCULAR | Status: DC

## 2022-11-22 MED ORDER — ATORVASTATIN CALCIUM 40 MG PO TABS
40.0000 mg | ORAL_TABLET | Freq: Every day | ORAL | Status: DC
  Administered 2022-11-22 – 2022-11-23 (×2): 40 mg via ORAL
  Filled 2022-11-22 (×2): qty 1

## 2022-11-22 MED ORDER — INSULIN GLARGINE-YFGN 100 UNIT/ML ~~LOC~~ SOLN
5.0000 [IU] | Freq: Every day | SUBCUTANEOUS | Status: DC
  Administered 2022-11-22 – 2022-11-23 (×2): 5 [IU] via SUBCUTANEOUS
  Filled 2022-11-22 (×3): qty 0.05

## 2022-11-22 NOTE — Consult Note (Signed)
Renal Service Consult Note [facility]  [name] [name] 11/22/2022 [name] Krabbe, MD Requesting Physician: Dr.   Jaquita Rector for Consult: ESRD pt w/ dysuria and gen'd weakness HPI: The patient is a 82 y.o. year-old w/ PMH as below who presented to ED c/o gen'd weakness, myalgia, pain all over, and dysuria. Pt denied fevers or chills. In ED pt afeb, BP 118/ 39, HR 60s RR 23  O2 99% on 4L [place]. COVID +. LA 2.2. CXR persistent LLL opacity, prob atx. Possible mild pulm edema. Pt given rocephin IV, blood cx's sent. Got 1 L bolus NS.     8/7- 10/17/22 --> admit for cardiac arrest at OP dialysis. Pt was in SNF. 8 min CPR. EF 25% by echo in hosptial. Had Klebsiella bacteremia ESBL (w/ high Wbc, sepsis picture). HD cath removed and replaced, got meropenem in house and ertapenem via PICC at home after dc. Chronic hypotension   8/26- 10/30/22 --> admit for AMS and hypoglycemia. AMS felt due to low BS vs acute COVID viral infection. She tested + for COVID. Got 3 day course of remdesivir. Renal followed for her MWF HD. Hx of syst CHF from last admit, EF 25%.   Pt seen in ED room. Just rec'd IV morphine and is sedated now. Per ED RN her SPO2's were low so she is on 2-3 L [place].    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   HYPERLIPIDEMIA 03/27/2006   HYPERTENSION 03/27/2006   Intertrochanteric fracture of right hip (HCC) 07/13/2012   LUNG NODULE 09/01/2006   Excision, Bx Benighn    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"   Osteopenia 2004   Dexa 2004 showed Osteopenia, DEXA 03/2007 normal   Osteopenia    Other chronic cystitis with hematuria    Pelvic fracture (HCC) 06/16/2016   S/P fall   Recurrent urinary tract infection    Seeing Urology   RETINOPATHY, BACKGROUND NOS 03/27/2006   Seizures (HCC)    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   Past Surgical History  Past Surgical History:  Procedure Laterality Date   ANKLE FRACTURE SURGERY Bilateral 08/2015   "right one was in 2 places"   CARDIAC CATHETERIZATION N/A 05/17/2015   Procedure: Left Heart Cath and Coronary Angiography;  Surgeon: Corky Crafts, MD;  Location: [facility] INVASIVE CV LAB;  Service: Cardiovascular;  Laterality: N/A;   CARDIAC CATHETERIZATION N/A 05/17/2015   Procedure: Coronary Balloon Angioplasty;  Surgeon: Corky Crafts, MD;  Location: [facility] INVASIVE CV LAB;  Service: Cardiovascular;  Laterality: N/A;   CARDIOVERSION N/A 08/05/2022   Procedure: CARDIOVERSION;  Surgeon: Chilton Si, MD;  Location: [facility] INVASIVE CV LAB;  Service: Cardiovascular;  Laterality: N/A;   FEMUR IM NAIL Right 07/15/2012   Procedure: INTRAMEDULLARY (IM) NAIL HIP;  Surgeon: Eulas Post, MD;  Location: [facility] OR;  Service: Orthopedics;  Laterality: Right;  FEMUR IM NAIL Left 12/02/2015   Procedure: INTRAMEDULLARY (IM) NAIL FEMORAL;  Surgeon: Eldred Manges, MD;  Location: [facility] OR;  Service: Orthopedics;  Laterality: Left;   FRACTURE SURGERY     INCISION AND DRAINAGE HIP Left 01/03/2016   Procedure: IRRIGATION AND DEBRIDEMENT HIP;  Surgeon: Eldred Manges, MD;  Location: [facility] OR;  Service: Orthopedics;  Laterality: Left;   IR FLUORO GUIDE CV LINE RIGHT  09/14/2022   IR FLUORO GUIDE CV LINE RIGHT  09/18/2022   IR FLUORO GUIDE CV LINE RIGHT  10/16/2022   IR FLUORO GUIDE CV LINE RIGHT  10/16/2022   IR REMOVAL TUN CV CATH W/O FL  10/12/2022   IR REMOVAL TUN CV CATH W/O FL  10/29/2022   IR US GUIDE VASC ACCESS RIGHT  09/14/2022   IR US  GUIDE VASC ACCESS RIGHT  10/16/2022   IR US GUIDE VASC ACCESS RIGHT  10/16/2022   SHOULDER SURGERY Bilateral    "don't remember which side"   Family History  Family History  Problem Relation Age of Onset   Diabetes Father    Heart attack Father 23   Diabetes Sister    Drug abuse Son    Cancer Neg Hx        no hx of colon or breast cancer   Social History  reports that she quit smoking about 44 years ago. Her smoking use included cigarettes. She has never used smokeless tobacco. She reports that she does not drink alcohol and does not use drugs. Allergies  Allergies  Allergen Reactions   Procaine Hcl Anaphylaxis   Amoxicillin Itching   Home medications Prior to Admission medications   Medication Sig Start Date End Date Taking? Authorizing Provider  acetaminophen (TYLENOL) 325 MG tablet Take 650 mg by mouth every 6 (six) hours as needed for moderate pain or mild pain.   Yes [provider]  alendronate (FOSAMAX) 35 MG tablet Take 35 mg by mouth every 7 (seven) days. Take with a full glass of water on an empty stomach.   Yes [provider]  Amino Acids-Protein Hydrolys (FEEDING SUPPLEMENT, PRO-STAT SUGAR FREE 64,) LIQD Take 30 mLs by mouth 2 (two) times daily. Between Meals   Yes [provider]  amiodarone (PACERONE) 200 MG tablet Take 1 tablet (200 mg total) by mouth daily. 08/13/22  Yes Zannie Cove, MD  apixaban (ELIQUIS) 2.5 MG TABS tablet Take 1 tablet (2.5 mg total) by mouth 2 (two) times daily. To start likely on Friday 6/14 if recommended by Coumadin clinic after labs done 08/21/22  Yes Chilton Si, MD  atorvastatin (LIPITOR) 40 MG tablet Take 1 tablet (40 mg total) by mouth daily. Patient taking differently: Take 40 mg by mouth at bedtime. 09/24/22  Yes Burnadette Pop, MD  cholecalciferol (VITAMIN D3) 25 MCG (1000 UNIT) tablet Take 1,000 Units by mouth daily.   Yes [provider]  ezetimibe (ZETIA) 10 MG tablet Take 1 tablet (10 mg  total) by mouth daily. 12/04/21  Yes Chilton Si, MD  furosemide (LASIX) 80 MG tablet Take 1 tablet (80 mg total) by mouth every Tuesday, Thursday, Saturday, and Sunday. 10/31/22  Yes [place], Eric J, DO  insulin glargine (LANTUS) 100 UNIT/ML Solostar Pen Inject 5 Units into the skin at bedtime. 10/30/22  Yes [place], Eric J, DO  midodrine (PROAMATINE) 5 MG tablet Take 1 tablet (5 mg total) by mouth 3 (three) times daily. Patient taking differently: Take 5 mg by mouth 2 (two) times daily with  a meal. Hold if SBP is more thann 120.Takes an adittional 5mg  on non dialysis days sun,tue,thur,sat. 10/17/22  Yes Charolotte Eke, MD  Multiple Vitamins-Minerals (MULTIVITAMIN ADULT PO) Take 1 tablet by mouth daily with breakfast.    Yes [provider]  nystatin (MYCOSTATIN/NYSTOP) powder Apply 1 Application topically 2 (two) times daily. 10/05/22  Yes [provider]  pantoprazole (PROTONIX) 40 MG tablet Take 1 tablet (40 mg total) by mouth daily. 09/24/22  Yes Burnadette Pop, MD  polyethylene glycol (MIRALAX / GLYCOLAX) 17 g packet Take 17 g by mouth daily. 09/24/22  Yes Burnadette Pop, MD  sodium chloride 0.9 % infusion Inject into the vein. 10/17/22   [provider]     Vitals:   11/22/22 1419 11/22/22 1441 11/22/22 1621  BP:  (!) 118/39 (!) 101/45  Pulse:   67  Resp:   12  Temp:  97.6 F (36.4 C)   TempSrc:  Oral   SpO2:   99%  Weight: 72 kg    Height: 5\' 9"  (1.753 m)     Exam Gen elderly lady, lying flat, no distress, [place] O2 No rash, cyanosis or gangrene Sclera anicteric, throat clear  No jvd or bruits Chest rales 1/3 up on L, clear at R base RRR no RG Abd soft ntnd no mass or ascites +bs GU defer MS no joint effusions or deformity Ext patchy bilat calf edema Neuro is sedated as above    RIJ TDC clean exit site    Renal-related home meds: - furosemide 80mg  once on T-Th-S-Su - midodrine 5mg  bid ac - MVI   OP HD: [facility] MWF  3.5h   400/600   67kg   3K/2.5  bath  TDC  Heparin none - last OP HD 9/20, post wt 68.4kg - dry wt lowered 74 --> 67kg in last 3 wks - mircera 100 mcg IV q 2 wks, last 9/16, due 9/30   CXR - mild vasc congestion, don't see any edema  Na 132  K 5.4  CO2 27  BUN 32  creat 2.51  alb <1.5   LA 2.2  WBC 16k  Hb 9.2    Assessment/ Plan: Acute hypoxic resp failure - possible pna vs UTI vs other. H/o dysuria. Rales L base, pna vs fluid. Stable on 2-3 L O2 [place]. Try and lower vol w/ HD in am.  Myalgias - refractory COVID vs UTI vs other. IV abx started per pmd Covid infection - remains + from Aug 2024 acute infection.  ESRD - on HD MWF. HD tomorrow. Has not missed OP HD.  Chronic hypotension - cont home midodrine 5 mg bid Volume - as above, some LE edema, losing body wt significantly in OP setting. Cont to lower w/ HD as tol.  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  Next esa is due 9/30.   MBD ckd - CCa in range, add on phos. Not sure binder.  DNR    Vinson Moselle  MD [facility] 11/22/2022, 7:01 PM  Recent Labs  Lab 11/22/22 1520  HGB 9.2*  ALBUMIN <1.5*  CALCIUM 7.5*  CREATININE 2.51*  K 5.4*   Inpatient medications:  [START ON 11/23/2022] insulin aspart  0-6 Units Subcutaneous TID WC   [START ON 11/23/2022] midodrine  5 mg Oral BID WC   sodium chloride flush  3 mL Intravenous Q12H    sodium chloride     meropenem (MERREM) IV     sodium chloride, acetaminophen **OR** acetaminophen, ondansetron **OR** ondansetron (ZOFRAN) IV,  sodium chloride flush

## 2022-11-22 NOTE — Assessment & Plan Note (Addendum)
With hypoxia, bnp pending. CXR with no acute findings ? If acute on chronic CHF Unable to tolerate GDMT due to chronic hypotension BNP elevated on admission.  Continue fluid management per HD

## 2022-11-22 NOTE — Assessment & Plan Note (Addendum)
UA with symptoms in setting of recurrent UTIs and ESBL klebsiella bacteremia in 10/07/2022.  Change to meropenem with recent history and de escalate as soon as possible once culture results  Has leukocytosis of 16.3, but no other SIRS/sepsis criteria

## 2022-11-22 NOTE — ED Triage Notes (Signed)
Coming in from [facility]. Pt reporting burning with urination and generalized pain and fatigue. Pt is alert and oriented x4. Pt is a dialysis pt getting dialysis MWF. Pt has a hx of cardiac arrest.   20lfa    Rr 24 Capnography 32 Hr 70 Bp 87/31 given 700 LR and 107/32

## 2022-11-22 NOTE — Progress Notes (Signed)
Pharmacy Antibiotic Note  [name] [name] is a 82 y.o. female admitted on 11/22/2022 presenting with fatigue, body aches, concern for infection.  Hx of ESBL Klebsiella pneumoniae pan resistant with sens to carbapenems.  Pharmacy has been consulted for Merrem dosing.  Plan: Merrem 500mg  IV q 12h Monitor renal function, Cx and clinical progression to narrow  Height: 5\' 9"  (175.3 cm) Weight: 72 kg (158 lb 11.7 oz) IBW/kg (Calculated) : 66.2  Temp (24hrs), Avg:97.6 F (36.4 C), Min:97.6 F (36.4 C), Max:97.6 F (36.4 C)  Recent Labs  Lab 11/22/22 1520 11/22/22 1534  WBC 16.3*  --   CREATININE 2.51*  --   LATICACIDVEN  --  2.2*    Estimated Creatinine Clearance: 18.7 mL/min (A) (by C-G formula based on SCr of 2.51 mg/dL (H)).    Allergies  Allergen Reactions   Procaine Hcl Anaphylaxis   Amoxicillin Itching    Daylene Posey, PharmD, [facility] Clinical Pharmacist ED Pharmacist Phone # [phone] 11/22/2022 6:35 PM

## 2022-11-22 NOTE — Assessment & Plan Note (Addendum)
Followed outpatient by nephrology Dialysis dependent AKI Potassium of 5.4, otherwise labs stable Unsure when last dialysis day was and she can not tell me Requiring oxygen, but not overtly overloaded  Dialysis MWF Nephrology consulted

## 2022-11-22 NOTE — Assessment & Plan Note (Signed)
History hard to get a grasp of, but states she has felt bad for a week and "hurt all over" and then says she has not felt good since starting dialysis or having her TDC replaced  Possible UTI-treating CKD/HD related pain  CK wnl  Checking TSH and free T4 as abnormal on last check in setting of recent amiodarone initiation B12

## 2022-11-22 NOTE — Assessment & Plan Note (Addendum)
Worsened after HD. - Continue midodrine

## 2022-11-22 NOTE — Plan of Care (Signed)
  Problem: Education: Goal: Knowledge of risk factors and measures for prevention of condition will improve Outcome: Not Progressing   Problem: Education: Goal: Knowledge of disease or condition will improve Outcome: Not Progressing   Problem: Health Behavior/Discharge Planning: Goal: Ability to manage health-related needs will improve Outcome: Not Progressing   Problem: Self-Care: Goal: Ability to participate in self-care as condition permits will improve Outcome: Not Progressing   Problem: Coping: Goal: Ability to adjust to condition or change in health will improve Outcome: Not Progressing   Problem: Fluid Volume: Goal: Ability to maintain a balanced intake and output will improve Outcome: Not Progressing   Problem: Health Behavior/Discharge Planning: Goal: Ability to manage health-related needs will improve Outcome: Not Progressing   Problem: Nutritional: Goal: Progress toward achieving an optimal weight will improve Outcome: Not Progressing   Problem: Education: Goal: Knowledge of General Education information will improve Description: Including pain rating scale, medication(s)/side effects and non-pharmacologic comfort measures Outcome: Not Progressing

## 2022-11-22 NOTE — ED Provider Notes (Addendum)
[place] EMERGENCY DEPARTMENT AT [facility] Provider Note   CSN: [id number] Arrival date & time: 11/22/22  1404     History  Chief Complaint  Patient presents with  . Fatigue  . Back Pain  . Dysuria    [name] [name] is a 82 y.o. female.   Back Pain Associated symptoms: dysuria   Dysuria    Patient is a resident of [facility] nursing home.  Patient has a history of hypertension obesity neuropathy paroxysmal atrial fibrillation hyponatremia seizure disorder acid reflux encephalitis diabetes cardiomyopathy hemodialysis bacteremia.  Patient presents to the ED for diffuse bodyaches.  Patient states she is hurting all throughout her body.  The symptoms started about a week ago.  She denies having any vomiting or diarrhea.  She denies any cough.  No difficulty breathing.  Patient states the pain is throughout her entire body including her arms and legs or torso or abdomen.  Home Medications Prior to Admission medications   Medication Sig Start Date End Date Taking? Authorizing Provider  alendronate (FOSAMAX) 35 MG tablet Take 35 mg by mouth every 7 (seven) days. Take with a full glass of water on an empty stomach.    [provider]  amiodarone (PACERONE) 200 MG tablet Take 1 tablet (200 mg total) by mouth daily. 08/13/22   Zannie Cove, MD  apixaban (ELIQUIS) 2.5 MG TABS tablet Take 1 tablet (2.5 mg total) by mouth 2 (two) times daily. To start likely on Friday 6/14 if recommended by Coumadin clinic after labs done 08/21/22   Chilton Si, MD  atorvastatin (LIPITOR) 40 MG tablet Take 1 tablet (40 mg total) by mouth daily. Patient taking differently: Take 40 mg by mouth at bedtime. 09/24/22   Burnadette Pop, MD  ezetimibe (ZETIA) 10 MG tablet Take 1 tablet (10 mg total) by mouth daily. 12/04/21   Chilton Si, MD  furosemide (LASIX) 80 MG tablet Take 1 tablet (80 mg total) by mouth every Tuesday, Thursday, Saturday, and Sunday. 10/31/22   [place],  Eric J, DO  insulin glargine (LANTUS) 100 UNIT/ML Solostar Pen Inject 5 Units into the skin at bedtime. 10/30/22   [place], Alvira Philips, DO  midodrine (PROAMATINE) 10 MG tablet Take 1 tablet (10 mg total) by mouth every Monday, Wednesday, and Friday with hemodialysis. 10/19/22   Charolotte Eke, MD  midodrine (PROAMATINE) 5 MG tablet Take 1 tablet (5 mg total) by mouth 3 (three) times daily. Patient taking differently: Take 5 mg by mouth 2 (two) times daily with a meal. Hold if SBP is more thann 120.Takes an adittional 5mg  on non dialysis days sun,tue,thur,sat. 10/17/22   Charolotte Eke, MD  Multiple Vitamins-Minerals (MULTIVITAMIN ADULT PO) Take 1 tablet by mouth daily with breakfast.     [provider]  nystatin (MYCOSTATIN/NYSTOP) powder Apply 1 Application topically 2 (two) times daily. 10/05/22   [provider]  pantoprazole (PROTONIX) 40 MG tablet Take 1 tablet (40 mg total) by mouth daily. 09/24/22   Burnadette Pop, MD  polyethylene glycol (MIRALAX / GLYCOLAX) 17 g packet Take 17 g by mouth daily. 09/24/22   Burnadette Pop, MD  sodium chloride 0.9 % infusion Inject into the vein. 10/17/22   [provider]  VITAMIN D, CHOLECALCIFEROL, PO Take 1 tablet by mouth daily.    [provider]      Allergies    Procaine hcl and Amoxicillin    Review of Systems   Review of Systems  Genitourinary:  Positive for  dysuria.  Musculoskeletal:  Positive for back pain.    Physical Exam Updated Vital Signs BP (!) 101/45 (BP Location: Right Arm)   Pulse 67   Temp 97.6 F (36.4 C) (Oral)   Resp 12   Ht 1.753 m (5\' 9" )   Wt 72 kg   SpO2 99%   BMI 23.44 kg/m  Physical Exam Vitals and nursing note reviewed.  Constitutional:      Appearance: She is well-developed. She is ill-appearing.  HENT:     Head: Normocephalic and atraumatic.     Right Ear: External ear normal.     Left Ear: External ear normal.  Eyes:     General: No scleral icterus.       Right eye: No  discharge.        Left eye: No discharge.     Conjunctiva/sclera: Conjunctivae normal.  Neck:     Trachea: No tracheal deviation.  Cardiovascular:     Rate and Rhythm: Normal rate and regular rhythm.  Pulmonary:     Effort: Pulmonary effort is normal. No respiratory distress.     Breath sounds: Normal breath sounds. No stridor. No wheezing or rales.     Comments: No chest wall tenderness Abdominal:     General: Bowel sounds are normal. There is no distension.     Palpations: Abdomen is soft.     Tenderness: There is no abdominal tenderness. There is no guarding or rebound.     Comments: No tenderness palpation her abdomen  Musculoskeletal:        General: Tenderness present. No deformity.     Cervical back: Neck supple.     Comments: Tenderness diffusely in her extremities, no focal areas of swelling or redness  Skin:    General: Skin is warm and dry.     Findings: No erythema.  Neurological:     General: No focal deficit present.     Mental Status: She is alert.     Cranial Nerves: No cranial nerve deficit, dysarthria or facial asymmetry.     Sensory: No sensory deficit.     Motor: No abnormal muscle tone or seizure activity.     Coordination: Coordination normal.  Psychiatric:        Mood and Affect: Mood normal.     ED Results / Procedures / Treatments   Labs (all labs ordered are listed, but only abnormal results are displayed) Labs Reviewed  SARS CORONAVIRUS 2 BY RT PCR - Abnormal; Notable for the following components:      Result Value   SARS Coronavirus 2 by RT PCR POSITIVE (*)    All other components within normal limits  COMPREHENSIVE METABOLIC PANEL - Abnormal; Notable for the following components:   Sodium 132 (*)    Potassium 5.4 (*)    Chloride 93 (*)    Glucose, Bld 166 (*)    BUN 32 (*)    Creatinine, Ser 2.51 (*)    Calcium 7.5 (*)    Total Protein 5.0 (*)    Albumin <1.5 (*)    AST 62 (*)    Alkaline Phosphatase 394 (*)    Total Bilirubin 1.3  (*)    GFR, Estimated 19 (*)    All other components within normal limits  CBC WITH DIFFERENTIAL/PLATELET - Abnormal; Notable for the following components:   WBC 16.3 (*)    RBC 3.24 (*)    Hemoglobin 9.2 (*)    HCT 29.3 (*)    RDW 19.7 (*)  Neutro Abs 14.8 (*)    All other components within normal limits  CK - Abnormal; Notable for the following components:   Total CK 29 (*)    All other components within normal limits  URINALYSIS, W/ REFLEX TO CULTURE (INFECTION SUSPECTED) - Abnormal; Notable for the following components:   Color, Urine BROWN (*)    APPearance TURBID (*)    Glucose, UA RESULTS UNAVAILABLE DUE TO INTERFERING SUBSTANCE (*)    Hgb urine dipstick RESULTS UNAVAILABLE DUE TO INTERFERING SUBSTANCE (*)    Bilirubin Urine RESULTS UNAVAILABLE DUE TO INTERFERING SUBSTANCE (*)    Ketones, ur RESULTS UNAVAILABLE DUE TO INTERFERING SUBSTANCE (*)    Protein, ur RESULTS UNAVAILABLE DUE TO INTERFERING SUBSTANCE (*)    Nitrite RESULTS UNAVAILABLE DUE TO INTERFERING SUBSTANCE (*)    Leukocytes,Ua RESULTS UNAVAILABLE DUE TO INTERFERING SUBSTANCE (*)    Bacteria, UA FEW (*)    All other components within normal limits  I-STAT CG4 LACTIC ACID, ED - Abnormal; Notable for the following components:   Lactic Acid, Venous 2.2 (*)    All other components within normal limits  TROPONIN I (HIGH SENSITIVITY) - Abnormal; Notable for the following components:   Troponin I (High Sensitivity) 35 (*)    All other components within normal limits  CULTURE, BLOOD (ROUTINE X 2)  CULTURE, BLOOD (ROUTINE X 2)  URINE CULTURE  LIPASE, BLOOD  BRAIN NATRIURETIC PEPTIDE  TSH  T4, FREE  VITAMIN B12  I-STAT CG4 LACTIC ACID, ED  TROPONIN I (HIGH SENSITIVITY)    EKG EKG Interpretation Date/Time:  Sunday November 22 2022 14:34:20 EDT Ventricular Rate:  72 PR Interval:  200 QRS Duration:  116 QT Interval:  436 QTC Calculation: 478 R Axis:   -48  Text Interpretation: Sinus rhythm Nonspecific  IVCD with LAD LVH with secondary repolarization abnormality Inferior infarct, old Probable anterior infarct, old No significant change since last tracing Confirmed by Linwood Dibbles [phone]) on 11/22/2022 4:44:33 PM  Radiology CT Head Wo Contrast  Result Date: 11/22/2022 CLINICAL DATA:  Headache, new onset (Age >= 51y) EXAM: CT HEAD WITHOUT CONTRAST TECHNIQUE: Contiguous axial images were obtained from the base of the skull through the vertex without intravenous contrast. RADIATION DOSE REDUCTION: This exam was performed according to the departmental dose-optimization program which includes automated exposure control, adjustment of the mA and/or kV according to patient size and/or use of iterative reconstruction technique. COMPARISON:  10/26/2022 FINDINGS: Brain: No evidence of acute infarction, hemorrhage, hydrocephalus, extra-axial collection or mass lesion/mass effect. Extensive left temporal lobe encephalomalacia, unchanged. Patchy low-density changes within the periventricular and subcortical white matter most compatible with chronic microvascular ischemic change. Mild diffuse cerebral volume loss. Vascular: Atherosclerotic calcifications involving the large vessels of the skull base. No unexpected hyperdense vessel. Skull: Normal. Negative for fracture or focal lesion. Sinuses/Orbits: No acute finding. Other: None. IMPRESSION: 1. No acute intracranial findings. 2. Chronic microvascular ischemic change and cerebral volume loss. 3. Chronic left temporal lobe encephalomalacia. Electronically Signed   By: Duanne Guess D.O.   On: 11/22/2022 17:33   DG Chest Port 1 View  Result Date: 11/22/2022 CLINICAL DATA:  [id number] Sepsis [facility]) [id number] EXAM: PORTABLE CHEST 1 VIEW COMPARISON:  October 26, 2022 FINDINGS: The cardiomediastinal silhouette is unchanged and enlarged in contour.RIGHT chest CVC with tip terminating over the superior cavoatrial junction. Small LEFT pleural effusion. No pneumothorax. Persistent LEFT  retrocardiac opacity. Mild interstitial prominence. Atherosclerotic calcifications. IMPRESSION: 1. Persistent LEFT retrocardiac opacity with small LEFT pleural effusion. Findings likely  reflect atelectasis. 2. Chronic background of mild pulmonary edema. Electronically Signed   By: Meda Klinefelter M.D.   On: 11/22/2022 15:44    Procedures .Critical Care  Performed by: Linwood Dibbles, MD Authorized by: Linwood Dibbles, MD   Critical care provider statement:    Critical care time (minutes):  30   Critical care was time spent personally by me on the following activities:  Development of treatment plan with patient or surrogate, discussions with consultants, evaluation of patient's response to treatment, examination of patient, ordering and review of laboratory studies, ordering and review of radiographic studies, ordering and performing treatments and interventions, pulse oximetry, re-evaluation of patient's condition and review of old charts     Medications Ordered in ED Medications  cefTRIAXone (ROCEPHIN) 1 g in sodium chloride 0.9 % 100 mL IVPB (1 g Intravenous New Bag/Given 11/22/22 1754)  midodrine (PROAMATINE) tablet 5 mg (has no administration in time range)  insulin aspart (novoLOG) injection 0-6 Units (has no administration in time range)  morphine (PF) 4 MG/ML injection 4 mg (4 mg Intravenous Given 11/22/22 1550)    ED Course/ Medical Decision Making/ A&P Clinical Course as of 11/22/22 1810  Sun Nov 22, 2022  1555 Chest x-ray shows persistent retrocardiac opacity [JK]  1555 White blood cell count elevated, lactic acid level elevated [JK]  1641 Metabolic panel shows potassium slightly increased at 5.4.  Creatinine similar to previous values. [JK]  1642 LFTs are increased. [JK]  1646 COvid test is positive  [JK]  1739 Case discussed with Dr Artis Flock [JK]  1810 Case discussed with Dr. Arlean Hopping nephrology [JK]    Clinical Course User Index [JK] Linwood Dibbles, MD                                  Medical Decision Making Differential diagnosis includes but not limited to bacterial infection etiology, rhabdomyolysis, viral infection  Problems Addressed: Chronic renal failure, unspecified CKD stage: chronic illness or injury COVID: acute illness or injury that poses a threat to life or bodily functions Lower urinary tract infectious disease: acute illness or injury that poses a threat to life or bodily functions  Amount and/or Complexity of Data Reviewed Labs: ordered. Radiology: ordered.  Risk Prescription drug management. Decision regarding hospitalization.   Patient presented to ED with complaints of lethargy diffuse bodyaches.  Patient has been complaining of dysuria noted to have an episode of low blood pressure earlier.  Consider the possibility of evolving sepsis.  Lactic acid level slightly increased but no evidence of hypotension.  Will treat with fluid boluses but no aggressive hydration at this time considering her renal failure and lack of signs of sepsis a this time 6:10 PM.  Patient does have evidence of UTI with elevated white blood cells and bacteria.  Patient also is COVID-positive which may be the primary etiology of her myalgias.  With her age comorbidities signs bacterial infection and slight lateral lactic acid level will start on antibiotics, consult the medical service for admission        Final Clinical Impression(s) / ED Diagnoses Final diagnoses:  Lower urinary tract infectious disease  COVID  Chronic renal failure, unspecified CKD stage    Rx / DC Orders ED Discharge Orders     None         Linwood Dibbles, MD 11/22/22 1739    Linwood Dibbles, MD 11/22/22 1810

## 2022-11-22 NOTE — Assessment & Plan Note (Signed)
-  Continue Eliquis - Continue amiodarone

## 2022-11-22 NOTE — Assessment & Plan Note (Signed)
Last A1c 9.7, recurrent hypoglycemia in the setting of ESBL bacteremia, COVID and FTT - Continue glargine - Continue SS correction insulin

## 2022-11-22 NOTE — Assessment & Plan Note (Signed)
With myalgias will check TSH, was abnormal after starting amiodarone Repeat TSH/free T4 pending

## 2022-11-22 NOTE — ED Notes (Signed)
>ED TO INPATIENT HANDOFF REPORT  ED Nurse Name and Phone #: [place] [phone]  S Name/Age/Gender [name] [name] 82 y.o. female Room/Bed: 024C/024C  Code Status   Code Status: Limited: Do not attempt resuscitation (DNR) -DNR-LIMITED -Do Not Intubate/DNI   Home/SNF/Other Nursing Home Patient oriented to: self, place, time, and situation Is this baseline? Yes   Triage Complete: Triage complete  Chief Complaint Acute respiratory failure with hypoxia (HCC) [J96.01]  Triage Note Coming in from [facility]. Pt reporting burning with urination and generalized pain and fatigue. Pt is alert and oriented x4. Pt is a dialysis pt getting dialysis MWF. Pt has a hx of cardiac arrest.   20lfa    Rr 24 Capnography 32 Hr 70 Bp 87/31 given 700 LR and 107/32    Allergies Allergies  Allergen Reactions   Procaine Hcl Anaphylaxis   Amoxicillin Itching    Level of Care/Admitting Diagnosis ED Disposition     ED Disposition  Admit   Condition  --   Comment  Hospital Area: [facility] [[id number]]  Level of Care: Progressive [102]  Admit to Progressive based on following criteria: MULTISYSTEM THREATS such as stable sepsis, metabolic/electrolyte imbalance with or without encephalopathy that is responding to early treatment.  May place patient in observation at [facility] or [facility] if equivalent level of care is available:: No  Covid Evaluation: Recent COVID positive no isolation required infection day 21-90  Diagnosis: Acute respiratory failure with hypoxia [facility]) [[id number]]  Admitting Physician: Orland Mustard [[id number]]  Attending Physician: Orland Mustard [[id number]]          B Medical/Surgery History Past Medical History:  Diagnosis Date   Atrial fibrillation (HCC)    SVT dx 2007, cath 2007 mild CAD, then had an cardioversion, ablation; still on coumadin , has occ palpitation, EKG 03-2010 NSR   Blindness of left eye    CKD (chronic kidney disease), stage III  (HCC) 06/16/2016   [name] 06/17/2016   DIABETES MELLITUS, TYPE II 03/27/2006   dr Everardo All   Eye muscle weakness    Right eye weakness after cataract surgery   GERD (gastroesophageal reflux disease) 10/05/2011   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   HYPERLIPIDEMIA 03/27/2006   HYPERTENSION 03/27/2006   Intertrochanteric fracture of right hip (HCC) 07/13/2012   LUNG NODULE 09/01/2006   Excision, Bx Benighn   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"   Osteopenia 2004   Dexa 2004 showed Osteopenia, DEXA 03/2007 normal   Osteopenia    Other chronic cystitis with hematuria    Pelvic fracture (HCC) 06/16/2016   S/P fall   Recurrent urinary tract infection    Seeing Urology   RETINOPATHY, BACKGROUND NOS 03/27/2006   Seizures (HCC)    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   Past Surgical History:  Procedure Laterality Date   ANKLE FRACTURE SURGERY Bilateral 08/2015   "right one was in 2 places"   CARDIAC CATHETERIZATION N/A 05/17/2015   Procedure: Left Heart Cath and Coronary Angiography;  Surgeon: Corky Crafts, MD;  Location: [facility] INVASIVE CV LAB;  Service: Cardiovascular;  Laterality: N/A;   CARDIAC CATHETERIZATION N/A 05/17/2015   Procedure: Coronary Balloon Angioplasty;  Surgeon: Corky Crafts, MD;  Location: [facility] INVASIVE CV LAB;  Service: Cardiovascular;  Laterality: N/A;   CARDIOVERSION N/A 08/05/2022   Procedure: CARDIOVERSION;  Surgeon: Chilton Si, MD;  Location: [facility] INVASIVE CV LAB;  Service: Cardiovascular;  Laterality: N/A;   FEMUR IM NAIL Right 07/15/2012  Procedure: INTRAMEDULLARY (IM) NAIL HIP;  Surgeon: Eulas Post, MD;  Location: [facility] OR;  Service: Orthopedics;  Laterality: Right;   FEMUR IM NAIL Left 12/02/2015   Procedure: INTRAMEDULLARY (IM) NAIL FEMORAL;  Surgeon: Eldred Manges, MD;  Location: [facility] OR;  Service: Orthopedics;  Laterality: Left;   FRACTURE SURGERY     INCISION AND DRAINAGE HIP Left 01/03/2016   Procedure: IRRIGATION AND DEBRIDEMENT HIP;  Surgeon: Eldred Manges,  MD;  Location: [facility] OR;  Service: Orthopedics;  Laterality: Left;   IR FLUORO GUIDE CV LINE RIGHT  09/14/2022   IR FLUORO GUIDE CV LINE RIGHT  09/18/2022   IR FLUORO GUIDE CV LINE RIGHT  10/16/2022   IR FLUORO GUIDE CV LINE RIGHT  10/16/2022   IR REMOVAL TUN CV CATH W/O FL  10/12/2022   IR REMOVAL TUN CV CATH W/O FL  10/29/2022   IR US GUIDE VASC ACCESS RIGHT  09/14/2022   IR US GUIDE VASC ACCESS RIGHT  10/16/2022   IR US GUIDE VASC ACCESS RIGHT  10/16/2022   SHOULDER SURGERY Bilateral    "don't remember which side"     A IV Location/Drains/Wounds Patient Lines/Drains/Airways Status     Active Line/Drains/Airways     Name Placement date Placement time Site Days   Peripheral IV 11/22/22 20 G Anterior;Left Forearm 11/22/22  1417  Forearm  less than 1   Hemodialysis Catheter Right Internal jugular Double lumen Permanent (Tunneled) 10/16/22  0841  Internal jugular  37   Pressure Injury 10/07/22 Sacrum Posterior;Mid Stage 1 -  Intact skin with non-blanchable redness of a localized area usually over a bony prominence. 10/07/22  1730  -- 46   Wound / Incision (Open or Dehisced) 09/24/22 (IAD) Incontinence Associated Dermatitis Sacrum Right;Left pink, incontinence moisture associated skin breakdown 09/24/22  0800  Sacrum  59            Intake/Output Last 24 hours No intake or output data in the 24 hours ending 11/22/22 1932  Labs/Imaging Results for orders placed or performed during the hospital encounter of 11/22/22 (from the past 48 hour(s))  Comprehensive metabolic panel     Status: Abnormal   Collection Time: 11/22/22  3:20 PM  Result Value Ref Range   Sodium 132 (L) 135 - 145 mmol/L   Potassium 5.4 (H) 3.5 - 5.1 mmol/L    Comment: HEMOLYSIS AT THIS LEVEL MAY AFFECT RESULT   Chloride 93 (L) 98 - 111 mmol/L   CO2 27 22 - 32 mmol/L   Glucose, Bld 166 (H) 70 - 99 mg/dL    Comment: Glucose reference range applies only to samples taken after fasting for at least 8 hours.   BUN 32 (H) 8 -  23 mg/dL   Creatinine, Ser 8.65 (H) 0.44 - 1.00 mg/dL   Calcium 7.5 (L) 8.9 - 10.3 mg/dL   Total Protein 5.0 (L) 6.5 - 8.1 g/dL   Albumin <7.8 (L) 3.5 - 5.0 g/dL   AST 62 (H) 15 - 41 U/L    Comment: HEMOLYSIS AT THIS LEVEL MAY AFFECT RESULT   ALT 19 0 - 44 U/L    Comment: HEMOLYSIS AT THIS LEVEL MAY AFFECT RESULT   Alkaline Phosphatase 394 (H) 38 - 126 U/L   Total Bilirubin 1.3 (H) 0.3 - 1.2 mg/dL    Comment: HEMOLYSIS AT THIS LEVEL MAY AFFECT RESULT   GFR, Estimated 19 (L) >60 mL/min    Comment: (NOTE) Calculated using the CKD-EPI Creatinine Equation (2021)  Anion gap 12 5 - 15    Comment: Performed at [facility], [address], [place], [place] [address]  CBC with Differential     Status: Abnormal   Collection Time: 11/22/22  3:20 PM  Result Value Ref Range   WBC 16.3 (H) 4.0 - 10.5 K/uL   RBC 3.24 (L) 3.87 - 5.11 MIL/uL   Hemoglobin 9.2 (L) 12.0 - 15.0 g/dL   HCT 84.6 (L) 96.2 - 95.2 %   MCV 90.4 80.0 - 100.0 fL   MCH 28.4 26.0 - 34.0 pg   MCHC 31.4 30.0 - 36.0 g/dL   RDW 84.1 (H) 32.4 - 40.1 %   Platelets 218 150 - 400 K/uL   nRBC 0.2 0.0 - 0.2 %   Neutrophils Relative % 91 %   Neutro Abs 14.8 (H) 1.7 - 7.7 K/uL   Lymphocytes Relative 5 %   Lymphs Abs 0.8 0.7 - 4.0 K/uL   Monocytes Relative 4 %   Monocytes Absolute 0.7 0.1 - 1.0 K/uL   Eosinophils Relative 0 %   Eosinophils Absolute 0.0 0.0 - 0.5 K/uL   Basophils Relative 0 %   Basophils Absolute 0.0 0.0 - 0.1 K/uL   nRBC 0 0 /100 WBC   Abs Immature Granulocytes 0.00 0.00 - 0.07 K/uL    Comment: Performed at [facility], [address], [place], [place] [address]  CK     Status: Abnormal   Collection Time: 11/22/22  3:20 PM  Result Value Ref Range   Total CK 29 (L) 38 - 234 U/L    Comment: HEMOLYSIS AT THIS LEVEL MAY AFFECT RESULT Performed at [facility], [address] [place], [place] [address]   Troponin I (High Sensitivity)     Status: Abnormal   Collection Time: 11/22/22  3:20 PM   Result Value Ref Range   Troponin I (High Sensitivity) 35 (H) <18 ng/L    Comment: (NOTE) Elevated high sensitivity troponin I (hsTnI) values and significant  changes across serial measurements may suggest ACS but many other  chronic and acute conditions are known to elevate hsTnI results.  Refer to the "Links" section for chest pain algorithms and additional  guidance. Performed at [facility], [address], [place], [place] [address]   Lipase, blood     Status: None   Collection Time: 11/22/22  3:20 PM  Result Value Ref Range   Lipase 19 11 - 51 U/L    Comment: Performed at [facility], [address] [place], [place] [address]  Troponin I (High Sensitivity)     Status: Abnormal   Collection Time: 11/22/22  3:20 PM  Result Value Ref Range   Troponin I (High Sensitivity) 34 (H) <18 ng/L    Comment: (NOTE) Elevated high sensitivity troponin I (hsTnI) values and significant  changes across serial measurements may suggest ACS but many other  chronic and acute conditions are known to elevate hsTnI results.  Refer to the "Links" section for chest pain algorithms and additional  guidance. Performed at [facility], [address], [place], [place] [address]   T4, free     Status: None   Collection Time: 11/22/22  3:20 PM  Result Value Ref Range   Free T4 0.61 0.61 - 1.12 ng/dL    Comment: HEMOLYSIS AT THIS LEVEL MAY AFFECT RESULT (NOTE) Biotin ingestion may interfere with free T4 tests. If the results are inconsistent with the  TSH level, previous test results, or the clinical presentation, then consider biotin interference. If needed, order repeat testing after stopping biotin. Performed at [facility], [address], [place], [place] [address]   SARS Coronavirus 2 by RT PCR (hospital order, performed in [facility]) *cepheid single result test* Anterior Nasal Swab     Status: Abnormal   Collection Time: 11/22/22  3:33 PM    Specimen: Anterior Nasal Swab  Result Value Ref Range   SARS Coronavirus 2 by RT PCR POSITIVE (A) NEGATIVE    Comment: Performed at [facility], [address] [place], [place] [address]  I-Stat Lactic Acid, ED     Status: Abnormal   Collection Time: 11/22/22  3:34 PM  Result Value Ref Range   Lactic Acid, Venous 2.2 (HH) 0.5 - 1.9 mmol/L   Comment NOTIFIED PHYSICIAN   Urinalysis, w/ Reflex to Culture (Infection Suspected) -Urine, Catheterized     Status: Abnormal   Collection Time: 11/22/22  4:21 PM  Result Value Ref Range   Specimen Source URINE, CLEAN CATCH    Color, Urine BROWN (A) YELLOW    Comment: BIOCHEMICALS MAY BE AFFECTED BY COLOR   APPearance TURBID (A) CLEAR   Specific Gravity, Urine RESULTS UNAVAILABLE DUE TO INTERFERING SUBSTANCE 1.005 - 1.030   pH RESULTS UNAVAILABLE DUE TO INTERFERING SUBSTANCE 5.0 - 8.0   Glucose, UA RESULTS UNAVAILABLE DUE TO INTERFERING SUBSTANCE (A) NEGATIVE mg/dL   Hgb urine dipstick RESULTS UNAVAILABLE DUE TO INTERFERING SUBSTANCE (A) NEGATIVE   Bilirubin Urine RESULTS UNAVAILABLE DUE TO INTERFERING SUBSTANCE (A) NEGATIVE   Ketones, ur RESULTS UNAVAILABLE DUE TO INTERFERING SUBSTANCE (A) NEGATIVE mg/dL   Protein, ur RESULTS UNAVAILABLE DUE TO INTERFERING SUBSTANCE (A) NEGATIVE mg/dL   Nitrite RESULTS UNAVAILABLE DUE TO INTERFERING SUBSTANCE (A) NEGATIVE   Leukocytes,Ua RESULTS UNAVAILABLE DUE TO INTERFERING SUBSTANCE (A) NEGATIVE   Squamous Epithelial / HPF 0-5 0 - 5 /HPF   WBC, UA >50 0 - 5 WBC/hpf    Comment: Reflex urine culture not performed if WBC <=10, OR if Squamous epithelial cells >5. If Squamous epithelial cells >5, suggest recollection.   RBC / HPF 21-50 0 - 5 RBC/hpf   Bacteria, UA FEW (A) NONE SEEN   Amorphous Crystal PRESENT     Comment: Performed at [facility], [address] [place], [place] [address]   *Note: Due to a large number of results and/or encounters for the requested time period, some results have not  been displayed. A complete set of results can be found in Results Review.   CT Head Wo Contrast  Result Date: 11/22/2022 CLINICAL DATA:  Headache, new onset (Age >= 51y) EXAM: CT HEAD WITHOUT CONTRAST TECHNIQUE: Contiguous axial images were obtained from the base of the skull through the vertex without intravenous contrast. RADIATION DOSE REDUCTION: This exam was performed according to the departmental dose-optimization program which includes automated exposure control, adjustment of the mA and/or kV according to patient size and/or use of iterative reconstruction technique. COMPARISON:  10/26/2022 FINDINGS: Brain: No evidence of acute infarction, hemorrhage, hydrocephalus, extra-axial collection or mass lesion/mass effect. Extensive left temporal lobe encephalomalacia, unchanged. Patchy low-density changes within the periventricular and subcortical white matter most compatible with chronic microvascular ischemic change. Mild diffuse cerebral volume loss. Vascular: Atherosclerotic calcifications involving the large vessels of the skull base. No unexpected hyperdense vessel. Skull: Normal. Negative for fracture or focal lesion. Sinuses/Orbits: No acute finding. Other: None. IMPRESSION: 1. No acute  intracranial findings. 2. Chronic microvascular ischemic change and cerebral volume loss. 3. Chronic left temporal lobe encephalomalacia. Electronically Signed   By: Duanne Guess D.O.   On: 11/22/2022 17:33   DG Chest Port 1 View  Result Date: 11/22/2022 CLINICAL DATA:  [id number] Sepsis [facility]) [id number] EXAM: PORTABLE CHEST 1 VIEW COMPARISON:  October 26, 2022 FINDINGS: The cardiomediastinal silhouette is unchanged and enlarged in contour.RIGHT chest CVC with tip terminating over the superior cavoatrial junction. Small LEFT pleural effusion. No pneumothorax. Persistent LEFT retrocardiac opacity. Mild interstitial prominence. Atherosclerotic calcifications. IMPRESSION: 1. Persistent LEFT retrocardiac opacity with small  LEFT pleural effusion. Findings likely reflect atelectasis. 2. Chronic background of mild pulmonary edema. Electronically Signed   By: Meda Klinefelter M.D.   On: 11/22/2022 15:44    Pending Labs Unresulted Labs (From admission, onward)     Start     Ordered   11/23/22 0500  Comprehensive metabolic panel  Tomorrow morning,   R        11/22/22 1826   11/23/22 0500  CBC  Tomorrow morning,   R        11/22/22 1826   11/22/22 1844  Lactic acid, plasma  (Lactic Acid)  STAT Now then every 3 hours,   R (with STAT occurrences)      11/22/22 1843   11/22/22 1737  Vitamin B12  Once,   R        11/22/22 1736   11/22/22 1736  TSH  Once,   R        11/22/22 1736   11/22/22 1735  Brain natriuretic peptide  Once,   R        11/22/22 1734   11/22/22 1621  Urine Culture  Once,   R        11/22/22 1621   11/22/22 1421  Culture, blood (Routine x 2)  BLOOD CULTURE X 2,   R (with STAT occurrences)      11/22/22 1421            Vitals/Pain Today's Vitals   11/22/22 1419 11/22/22 1441 11/22/22 1621  BP:  (!) 118/39 (!) 101/45  Pulse:   67  Resp:   12  Temp:  97.6 F (36.4 C)   TempSrc:  Oral   SpO2:   99%  Weight: 158 lb 11.7 oz (72 kg)    Height: 5\' 9"  (1.753 m)    PainSc: 6       Isolation Precautions No active isolations  Medications Medications  midodrine (PROAMATINE) tablet 5 mg (has no administration in time range)  insulin aspart (novoLOG) injection 0-6 Units (has no administration in time range)  sodium chloride flush (NS) 0.9 % injection 3 mL (has no administration in time range)  sodium chloride flush (NS) 0.9 % injection 3 mL (has no administration in time range)  0.9 %  sodium chloride infusion (has no administration in time range)  acetaminophen (TYLENOL) tablet 650 mg (has no administration in time range)    Or  acetaminophen (TYLENOL) suppository 650 mg (has no administration in time range)  ondansetron (ZOFRAN) tablet 4 mg (has no administration in time range)     Or  ondansetron (ZOFRAN) injection 4 mg (has no administration in time range)  meropenem (MERREM) 500 mg in sodium chloride 0.9 % 100 mL IVPB (has no administration in time range)  morphine (PF) 4 MG/ML injection 4 mg (4 mg Intravenous Given 11/22/22 1550)  cefTRIAXone (ROCEPHIN) 1 g in sodium chloride 0.9 %  100 mL IVPB (1 g Intravenous New Bag/Given 11/22/22 1754)    Mobility walks with device     Focused Assessments Pulmonary Assessment Handoff:  Lung sounds:   O2 Device: Nasal Cannula O2 Flow Rate (L/min): 4 L/min    R Recommendations: See Admitting Provider Note  Report given to:   Additional Notes:

## 2022-11-22 NOTE — H&P (Signed)
History and Physical    Patient: [name] [name] DOB: 05-20-1940 DOA: 11/22/2022 DOS: the patient was seen and examined on 11/22/2022 PCP: [name], MD  Patient coming from: SNF - [facility]. She uses walker.    Chief Complaint: myalgias/dysuria   HPI: [name] [name] is a 82 y.o. female with medical history significant of ESRD on dialysis MWF, atrial fibrillation on eliquis, T2DM, GERD, hypotension on midodrine, HLD, recurrent UTIs, seizures, systolic CHF, hx of CVA  presenting to ED with complaints of dysuria, fatigue and back pain. Hospitalized 8/26-8/30 for AMS secondary to hypoglycemia and covid infection. Completed 3 day course of remdesivir. She states she started to feel bad about one week ago on Sunday. She states she felt weak and had pain all over her body. She also has dysuria that she noticed as well. Unsure about frequency or urgency. She can not recall her last dialysis. She feels like she has gone downhill since dialysis started.   She just recent been hospitalized 8/7-8/17, after having suspected cardiac arrest with echo showing EF of 25%.  Patient found to be bacteremic with ESBL Klebsiella.  ID has been consulted and recommended to complete 2 weeks ertapenem via PICC after HD catheter removed which should have completed on 8/25.   Denies any fever/chills, vision changes/headaches, chest pain or palpitations, shortness of breath or cough, abdominal pain, N/V/D or leg swelling.    She does not smoke or drink alcohol.   ER Course:  vitals: afebrile, bp: 118/39, HR: 63, HR: 72, RR: 23, oxygen; 99% 4L [place] Pertinent labs: wbc: 16.3, hgb: 9.2, sodium: 132, potassium: 5.2, BUN: 32, creatinine: 2.51, albumin <1.5, AST: 62, covid positive, lactic acid: 2.2, CXR:  Persistent LEFT retrocardiac opacity with small LEFT pleural effusion. Findings likely reflect atelectasis. 2. Chronic background of mild pulmonary edema. CT head:  In ED: given 500cc bolus and  rocephin. BC obtained. TRH asked to admit. She was given 1L IVF with EMS.    Review of Systems: As mentioned in the history of present illness. All other systems reviewed and are negative. 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   HYPERLIPIDEMIA 03/27/2006   HYPERTENSION 03/27/2006   Intertrochanteric fracture of right hip (HCC) 07/13/2012   LUNG NODULE 09/01/2006   Excision, Bx Benighn   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"   Osteopenia 2004   Dexa 2004 showed Osteopenia, DEXA 03/2007 normal   Osteopenia    Other chronic cystitis with hematuria    Pelvic fracture (HCC) 06/16/2016   S/P fall   Recurrent urinary tract infection    Seeing Urology   RETINOPATHY, BACKGROUND NOS 03/27/2006   Seizures (HCC)    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   Past Surgical History:  Procedure Laterality Date   ANKLE FRACTURE SURGERY Bilateral 08/2015   "right one was in 2 places"   CARDIAC CATHETERIZATION N/A 05/17/2015   Procedure: Left Heart Cath and Coronary Angiography;  Surgeon: Renelda Loma  Hoyle Barr, MD;  Location: [facility] INVASIVE CV LAB;  Service: Cardiovascular;  Laterality: N/A;   CARDIAC CATHETERIZATION N/A 05/17/2015   Procedure: Coronary Balloon Angioplasty;  Surgeon: Corky Crafts, MD;  Location: [facility] INVASIVE CV LAB;  Service: Cardiovascular;  Laterality: N/A;   CARDIOVERSION N/A 08/05/2022   Procedure: CARDIOVERSION;  Surgeon: Chilton Si, MD;  Location: [facility] INVASIVE CV LAB;  Service: Cardiovascular;  Laterality: N/A;   FEMUR IM  NAIL Right 07/15/2012   Procedure: INTRAMEDULLARY (IM) NAIL HIP;  Surgeon: Eulas Post, MD;  Location: [facility] OR;  Service: Orthopedics;  Laterality: Right;   FEMUR IM NAIL Left 12/02/2015   Procedure: INTRAMEDULLARY (IM) NAIL FEMORAL;  Surgeon: Eldred Manges, MD;  Location: [facility] OR;  Service: Orthopedics;  Laterality: Left;   FRACTURE SURGERY     INCISION AND DRAINAGE HIP Left 01/03/2016   Procedure: IRRIGATION AND DEBRIDEMENT HIP;  Surgeon: Eldred Manges, MD;  Location: [facility] OR;  Service: Orthopedics;  Laterality: Left;   IR FLUORO GUIDE CV LINE RIGHT  09/14/2022   IR FLUORO GUIDE CV LINE RIGHT  09/18/2022   IR FLUORO GUIDE CV LINE RIGHT  10/16/2022   IR FLUORO GUIDE CV LINE RIGHT  10/16/2022   IR REMOVAL TUN CV CATH W/O FL  10/12/2022   IR REMOVAL TUN CV CATH W/O FL  10/29/2022   IR US GUIDE VASC ACCESS RIGHT  09/14/2022   IR US GUIDE VASC ACCESS RIGHT  10/16/2022   IR US GUIDE VASC ACCESS RIGHT  10/16/2022   SHOULDER SURGERY Bilateral    "don't remember which side"   Social History:  reports that she quit smoking about 44 years ago. Her smoking use included cigarettes. She has never used smokeless tobacco. She reports that she does not drink alcohol and does not use drugs.  Allergies  Allergen Reactions   Procaine Hcl Anaphylaxis   Amoxicillin Itching    Family History  Problem Relation Age of Onset   Diabetes Father    Heart attack Father 75   Diabetes Sister    Drug abuse Son    Cancer Neg Hx        no hx of colon or breast cancer    Prior to Admission medications   Medication Sig Start Date End Date Taking? Authorizing Provider  alendronate (FOSAMAX) 35 MG tablet Take 35 mg by mouth every 7 (seven) days. Take with a full glass of water on an empty stomach.    [provider]  amiodarone (PACERONE) 200 MG tablet Take 1 tablet (200 mg total) by mouth daily. 08/13/22   Zannie Cove, MD  apixaban (ELIQUIS) 2.5 MG TABS tablet Take 1 tablet (2.5 mg total) by mouth 2 (two) times  daily. To start likely on Friday 6/14 if recommended by Coumadin clinic after labs done 08/21/22   Chilton Si, MD  atorvastatin (LIPITOR) 40 MG tablet Take 1 tablet (40 mg total) by mouth daily. Patient taking differently: Take 40 mg by mouth at bedtime. 09/24/22   Burnadette Pop, MD  ezetimibe (ZETIA) 10 MG tablet Take 1 tablet (10 mg total) by mouth daily. 12/04/21   Chilton Si, MD  furosemide (LASIX) 80 MG tablet Take 1 tablet (80 mg total) by mouth every Tuesday, Thursday, Saturday, and Sunday. 10/31/22   [place], Eric J, DO  insulin glargine (LANTUS) 100 UNIT/ML Solostar Pen Inject 5 Units into the skin at bedtime. 10/30/22   [place], Eric J, DO  midodrine (PROAMATINE) 10 MG tablet Take 1 tablet (10  mg total) by mouth every Monday, Wednesday, and Friday with hemodialysis. 10/19/22   Charolotte Eke, MD  midodrine (PROAMATINE) 5 MG tablet Take 1 tablet (5 mg total) by mouth 3 (three) times daily. Patient taking differently: Take 5 mg by mouth 2 (two) times daily with a meal. Hold if SBP is more thann 120.Takes an adittional 5mg  on non dialysis days sun,tue,thur,sat. 10/17/22   Charolotte Eke, MD  Multiple Vitamins-Minerals (MULTIVITAMIN ADULT PO) Take 1 tablet by mouth daily with breakfast.     [provider]  nystatin (MYCOSTATIN/NYSTOP) powder Apply 1 Application topically 2 (two) times daily. 10/05/22   [provider]  pantoprazole (PROTONIX) 40 MG tablet Take 1 tablet (40 mg total) by mouth daily. 09/24/22   Burnadette Pop, MD  polyethylene glycol (MIRALAX / GLYCOLAX) 17 g packet Take 17 g by mouth daily. 09/24/22   Burnadette Pop, MD  sodium chloride 0.9 % infusion Inject into the vein. 10/17/22   [provider]  VITAMIN D, CHOLECALCIFEROL, PO Take 1 tablet by mouth daily.    [provider]    Physical Exam: Vitals:   11/22/22 1419 11/22/22 1441 11/22/22 1621  BP:  (!) 118/39 (!) 101/45  Pulse:   67  Resp:   12  Temp:  97.6 F (36.4 C)    TempSrc:  Oral   SpO2:   99%  Weight: 72 kg    Height: 5\' 9"  (1.753 m)     General:  Appears calm and comfortable and is in NAD Eyes:  PERRL, EOMI, normal lids, iris ENT:  grossly normal hearing, lips & tongue, mmm; appropriate dentition Neck:  no LAD, masses or thyromegaly; no carotid bruits Cardiovascular:  RRR, no m/r/g. No LE edema.  Respiratory:   CTA bilaterally with no wheezes/rales/rhonchi.  Normal respiratory effort. Abdomen:  soft, NT, ND, NABS Back:   normal alignment, no CVAT Skin:  no rash or induration seen on limited exam Musculoskeletal:  grossly normal tone BUE/BLE, good ROM, no bony abnormality Lower extremity:  No LE edema.  Limited foot exam with no ulcerations.  2+ distal pulses. Psychiatric:  grossly normal mood and affect, speech hard to understand. appropriate, AOx2 Neurologic:  CN 2-12 grossly intact, moves all extremities in coordinated fashion, sensation intact   Radiological Exams on Admission: Independently reviewed - see discussion in A/P where applicable  CT Head Wo Contrast  Result Date: 11/22/2022 CLINICAL DATA:  Headache, new onset (Age >= 51y) EXAM: CT HEAD WITHOUT CONTRAST TECHNIQUE: Contiguous axial images were obtained from the base of the skull through the vertex without intravenous contrast. RADIATION DOSE REDUCTION: This exam was performed according to the departmental dose-optimization program which includes automated exposure control, adjustment of the mA and/or kV according to patient size and/or use of iterative reconstruction technique. COMPARISON:  10/26/2022 FINDINGS: Brain: No evidence of acute infarction, hemorrhage, hydrocephalus, extra-axial collection or mass lesion/mass effect. Extensive left temporal lobe encephalomalacia, unchanged. Patchy low-density changes within the periventricular and subcortical white matter most compatible with chronic microvascular ischemic change. Mild diffuse cerebral volume loss. Vascular: Atherosclerotic  calcifications involving the large vessels of the skull base. No unexpected hyperdense vessel. Skull: Normal. Negative for fracture or focal lesion. Sinuses/Orbits: No acute finding. Other: None. IMPRESSION: 1. No acute intracranial findings. 2. Chronic microvascular ischemic change and cerebral volume loss. 3. Chronic left temporal lobe encephalomalacia. Electronically Signed   By: Duanne Guess D.O.   On: 11/22/2022 17:33   DG Chest [facility] 1 View  Result  Date: 11/22/2022 CLINICAL DATA:  [id number] Sepsis [facility]) [id number] EXAM: PORTABLE CHEST 1 VIEW COMPARISON:  October 26, 2022 FINDINGS: The cardiomediastinal silhouette is unchanged and enlarged in contour.RIGHT chest CVC with tip terminating over the superior cavoatrial junction. Small LEFT pleural effusion. No pneumothorax. Persistent LEFT retrocardiac opacity. Mild interstitial prominence. Atherosclerotic calcifications. IMPRESSION: 1. Persistent LEFT retrocardiac opacity with small LEFT pleural effusion. Findings likely reflect atelectasis. 2. Chronic background of mild pulmonary edema. Electronically Signed   By: Meda Klinefelter M.D.   On: 11/22/2022 15:44    EKG: Independently reviewed.  NSR with rate 72; nonspecific ST changes with no evidence of acute ischemia. Similar to previous EKG    Labs on Admission: I have personally reviewed the available labs and imaging studies at the time of the admission.  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), stage IV requiring dialysis (HCC)   Abnormal results of thyroid function studies    Myalgia   Type 2 diabetes mellitus with chronic kidney disease, with long-term current use of insulin (HCC)   Paroxysmal atrial fibrillation (HCC)   Chronic systolic CHF (congestive heart failure) (HCC)   CAD S/P percutaneous coronary angioplasty with mildly elevatd troponin   Anemia due to chronic kidney disease   GERD (gastroesophageal reflux disease)   Hyperlipidemia    Assessment and Plan: * Acute respiratory failure with hypoxia (HCC) 82 year old female presenting with complaints of myalgias, low back pain and dysuria requiring 3-4L to maintain saturation and down to 84% on RA -obs to progressive -CXR with chronic mild pulmonary edema, but no acute findings -on eliquis PE very low on differential with no tachycardia -will check bnp, possibly acute on chronic CHF. Volume control per HD -given morphine and unsure if catalyst for this  -initial troponin 35. No chest pain or acute changes on EKG likely demand ischemia. F/u on delta.  -head CT with no acute findings  -wean oxygen as tolerated   UTI (urinary tract infection) in setting of recurrent UTI UA with symptoms in setting of recurrent UTIs and ESBL klebsiella bacteremia in 10/07/2022.  Change to meropenem with recent history and de escalate as soon as possible once culture results  Has leukocytosis of 16.3, but no other SIRS/sepsis criteria   Chronic hypotension Given 1L IVF with EMS. 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), stage IV requiring dialysis (HCC) Followed outpatient by nephrology Dialysis dependent AKI Potassium of 5.4, otherwise labs stable Unsure when last dialysis day was and she can not tell me Requiring oxygen, but not overtly overloaded  Dialysis MWF Nephrology consulted    Myalgia History hard to get a grasp of, but states she has felt bad for a week and "hurt all over"  and then says she has not felt good since starting dialysis or having her TDC replaced  Possible UTI-treating CKD/HD related pain  CK wnl  Checking TSH and free T4 as abnormal on last check  in setting of recent amiodarone initiation B12    Abnormal results of thyroid function studies With myalgias will check TSH, was abnormal after starting amiodarone Repeat TSH/free T4 pending   Type 2 diabetes mellitus with chronic kidney disease, with long-term current use of insulin (HCC) A1C of 9.7 in July of 2024 Had some hypoglycemia on recent admit and changed to long acting insulin  Continue lantus 5 units and very sensitive SSI   Paroxysmal atrial fibrillation (HCC) Recently started on amiodarone.  Continue eliquis   Chronic systolic CHF (congestive heart failure) (HCC) With hypoxia, bnp pending. CXR with no acute findings ? If acute on chronic CHF Unable to tolerate GDMT due to chronic hypotension BNP elevated on admission.  Continue fluid management per HD   CAD S/P percutaneous coronary angioplasty with mildly elevatd troponin Troponin 35, delta pending.  She has no chest pain or acute changes on EKG Not indicative of ACS and likely due to acute respiratory failure Continue to monitor on tele and f/u on delta  Continue medical management with lipitor, zetia   Anemia due to chronic kidney disease Baseline of 9 for hgb Stable, continue to follow   GERD (gastroesophageal reflux disease) Continue daily PPI   Hyperlipidemia Continue lipitor and zetia daily      Advance Care Planning:   Code Status: Limited: Do not attempt resuscitation (DNR) -DNR-LIMITED -Do Not Intubate/DNI    Consults: nephrology   DVT Prophylaxis: eliquis   Family Communication: updated her husband by phone. Loreen Freud   Severity of Illness: The appropriate patient status for this patient is OBSERVATION. Observation status is judged to be reasonable and necessary in order to provide the required  intensity of service to ensure the patient's safety. The patient's presenting symptoms, physical exam findings, and initial radiographic and laboratory data in the context of their medical condition is felt to place them at decreased risk for further clinical deterioration. Furthermore, it is anticipated that the patient will be medically stable for discharge from the hospital within 2 midnights of admission.   Author: Orland Mustard, MD 11/22/2022 6:40 PM  For on call review [email or web address].

## 2022-11-22 NOTE — Assessment & Plan Note (Addendum)
Troponin 35, delta pending.  She has no chest pain or acute changes on EKG Not indicative of ACS and likely due to acute respiratory failure Continue to monitor on tele and f/u on delta  Continue medical management with lipitor, zetia

## 2022-11-22 NOTE — Assessment & Plan Note (Signed)
Continue daily PPI.  

## 2022-11-23 ENCOUNTER — Observation Stay (HOSPITAL_COMMUNITY): Payer: Medicare Other

## 2022-11-23 DIAGNOSIS — M4856XA Collapsed vertebra, not elsewhere classified, lumbar region, initial encounter for fracture: Secondary | ICD-10-CM | POA: Diagnosis present

## 2022-11-23 DIAGNOSIS — G9341 Metabolic encephalopathy: Secondary | ICD-10-CM | POA: Diagnosis present

## 2022-11-23 DIAGNOSIS — R6520 Severe sepsis without septic shock: Secondary | ICD-10-CM | POA: Diagnosis present

## 2022-11-23 DIAGNOSIS — W19XXXA Unspecified fall, initial encounter: Secondary | ICD-10-CM | POA: Diagnosis present

## 2022-11-23 DIAGNOSIS — I480 Paroxysmal atrial fibrillation: Secondary | ICD-10-CM | POA: Diagnosis present

## 2022-11-23 DIAGNOSIS — E1122 Type 2 diabetes mellitus with diabetic chronic kidney disease: Secondary | ICD-10-CM | POA: Diagnosis present

## 2022-11-23 DIAGNOSIS — I5022 Chronic systolic (congestive) heart failure: Secondary | ICD-10-CM | POA: Diagnosis present

## 2022-11-23 DIAGNOSIS — A4159 Other Gram-negative sepsis: Secondary | ICD-10-CM | POA: Diagnosis present

## 2022-11-23 DIAGNOSIS — N184 Chronic kidney disease, stage 4 (severe): Secondary | ICD-10-CM | POA: Diagnosis not present

## 2022-11-23 DIAGNOSIS — D494 Neoplasm of unspecified behavior of bladder: Secondary | ICD-10-CM | POA: Diagnosis not present

## 2022-11-23 DIAGNOSIS — Z992 Dependence on renal dialysis: Secondary | ICD-10-CM | POA: Diagnosis not present

## 2022-11-23 DIAGNOSIS — N189 Chronic kidney disease, unspecified: Secondary | ICD-10-CM | POA: Diagnosis not present

## 2022-11-23 DIAGNOSIS — B961 Klebsiella pneumoniae [K. pneumoniae] as the cause of diseases classified elsewhere: Secondary | ICD-10-CM | POA: Diagnosis not present

## 2022-11-23 DIAGNOSIS — Z66 Do not resuscitate: Secondary | ICD-10-CM | POA: Diagnosis present

## 2022-11-23 DIAGNOSIS — Z1612 Extended spectrum beta lactamase (ESBL) resistance: Secondary | ICD-10-CM | POA: Diagnosis present

## 2022-11-23 DIAGNOSIS — N136 Pyonephrosis: Secondary | ICD-10-CM | POA: Diagnosis present

## 2022-11-23 DIAGNOSIS — J9601 Acute respiratory failure with hypoxia: Secondary | ICD-10-CM | POA: Diagnosis present

## 2022-11-23 DIAGNOSIS — Z515 Encounter for palliative care: Secondary | ICD-10-CM | POA: Diagnosis not present

## 2022-11-23 DIAGNOSIS — I132 Hypertensive heart and chronic kidney disease with heart failure and with stage 5 chronic kidney disease, or end stage renal disease: Secondary | ICD-10-CM | POA: Diagnosis present

## 2022-11-23 DIAGNOSIS — I9589 Other hypotension: Secondary | ICD-10-CM | POA: Diagnosis present

## 2022-11-23 DIAGNOSIS — A419 Sepsis, unspecified organism: Secondary | ICD-10-CM | POA: Diagnosis not present

## 2022-11-23 DIAGNOSIS — Z7189 Other specified counseling: Secondary | ICD-10-CM | POA: Diagnosis not present

## 2022-11-23 DIAGNOSIS — N390 Urinary tract infection, site not specified: Secondary | ICD-10-CM | POA: Diagnosis present

## 2022-11-23 DIAGNOSIS — Z8616 Personal history of COVID-19: Secondary | ICD-10-CM | POA: Diagnosis not present

## 2022-11-23 DIAGNOSIS — E038 Other specified hypothyroidism: Secondary | ICD-10-CM | POA: Diagnosis present

## 2022-11-23 DIAGNOSIS — E1165 Type 2 diabetes mellitus with hyperglycemia: Secondary | ICD-10-CM | POA: Diagnosis present

## 2022-11-23 DIAGNOSIS — N179 Acute kidney failure, unspecified: Secondary | ICD-10-CM | POA: Diagnosis present

## 2022-11-23 DIAGNOSIS — D631 Anemia in chronic kidney disease: Secondary | ICD-10-CM | POA: Diagnosis present

## 2022-11-23 DIAGNOSIS — I953 Hypotension of hemodialysis: Secondary | ICD-10-CM | POA: Diagnosis present

## 2022-11-23 DIAGNOSIS — U071 COVID-19: Secondary | ICD-10-CM | POA: Diagnosis not present

## 2022-11-23 DIAGNOSIS — N186 End stage renal disease: Secondary | ICD-10-CM | POA: Diagnosis present

## 2022-11-23 DIAGNOSIS — E11649 Type 2 diabetes mellitus with hypoglycemia without coma: Secondary | ICD-10-CM | POA: Diagnosis not present

## 2022-11-23 LAB — GLUCOSE, CAPILLARY
Glucose-Capillary: 120 mg/dL — ABNORMAL HIGH (ref 70–99)
Glucose-Capillary: 83 mg/dL (ref 70–99)
Glucose-Capillary: 121 mg/dL — ABNORMAL HIGH (ref 70–99)
Glucose-Capillary: 141 mg/dL — ABNORMAL HIGH (ref 70–99)

## 2022-11-23 LAB — BLOOD CULTURE ID PANEL (REFLEXED) - BCID2

## 2022-11-23 LAB — COMPREHENSIVE METABOLIC PANEL
ALT: 22 U/L (ref 0–44)
AST: 58 U/L — ABNORMAL HIGH (ref 15–41)
Albumin: <1.5 g/dL — ABNORMAL LOW (ref 3.5–5.0)
Alkaline Phosphatase: 370 U/L — ABNORMAL HIGH (ref 38–126)
Anion gap: 10 (ref 5–15)
BUN: 37 mg/dL — ABNORMAL HIGH (ref 8–23)
CO2: 27 mmol/L (ref 22–32)
Calcium: 7.6 mg/dL — ABNORMAL LOW (ref 8.9–10.3)
Chloride: 95 mmol/L — ABNORMAL LOW (ref 98–111)
Creatinine, Ser: 2.79 mg/dL — ABNORMAL HIGH (ref 0.44–1.00)
GFR, Estimated: 17 mL/min — ABNORMAL LOW (ref >60)
Glucose, Bld: 141 mg/dL — ABNORMAL HIGH (ref 70–99)
Potassium: 4.6 mmol/L (ref 3.5–5.1)
Sodium: 132 mmol/L — ABNORMAL LOW (ref 135–145)
Total Bilirubin: 1 mg/dL (ref 0.3–1.2)
Total Protein: 4.7 g/dL — ABNORMAL LOW (ref 6.5–8.1)

## 2022-11-23 LAB — CBC
HCT: 28.5 % — ABNORMAL LOW (ref 36.0–46.0)
Hemoglobin: 9.2 g/dL — ABNORMAL LOW (ref 12.0–15.0)
MCH: 29.4 pg (ref 26.0–34.0)
MCHC: 32.3 g/dL (ref 30.0–36.0)
MCV: 91.1 fL (ref 80.0–100.0)
Platelets: 239 10*3/uL (ref 150–400)
RBC: 3.13 MIL/uL — ABNORMAL LOW (ref 3.87–5.11)
RDW: 19.6 % — ABNORMAL HIGH (ref 11.5–15.5)
WBC: 20.2 10*3/uL — ABNORMAL HIGH (ref 4.0–10.5)
nRBC: 0.3 % — ABNORMAL HIGH (ref 0.0–0.2)

## 2022-11-23 LAB — HEPATITIS B SURFACE ANTIGEN: Hepatitis B Surface Ag: REACTIVE — AB

## 2022-11-23 MED ORDER — SODIUM CHLORIDE 0.9 % IV BOLUS
500.0000 mL | INTRAVENOUS | Status: AC
  Administered 2022-11-23: 500 mL via INTRAVENOUS

## 2022-11-23 MED ORDER — MIDODRINE HCL 5 MG PO TABS
20.0000 mg | ORAL_TABLET | ORAL | Status: AC
  Administered 2022-11-23: 20 mg via ORAL
  Filled 2022-11-23: qty 4

## 2022-11-23 MED ORDER — MIDODRINE HCL 5 MG PO TABS: 10.0000 mg | ORAL_TABLET | ORAL | Status: DC

## 2022-11-23 MED ORDER — MIDODRINE HCL 5 MG PO TABS
10.0000 mg | ORAL_TABLET | Freq: Three times a day (TID) | ORAL | Status: DC
  Administered 2022-11-23 (×3): 10 mg via ORAL
  Filled 2022-11-23 (×4): qty 2

## 2022-11-23 MED ORDER — NEPRO/CARBSTEADY PO LIQD
237.0000 mL | Freq: Two times a day (BID) | ORAL | Status: DC
  Administered 2022-11-23: 237 mL via ORAL

## 2022-11-23 NOTE — Progress Notes (Signed)
Pt receives out-pt HD at [facility] [place] on MWF. Pt admitted from snf. Contacted clinic with update. Will assist as needed.   Olivia Canter Renal Navigator [phone]

## 2022-11-23 NOTE — Consult Note (Signed)
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 female with hx of CAD c/b CHF, T2DM, GERD, HLD, hx of covid-19 in August, CDK4 requiring HD, who was admitted from SNF with feeling poorly, myalgias, and some dysuria. This is her 3rd hospitalization since early July for recurrent uti and secondary bacteremia. She hwas most recently hospitalized from 8/7-8/17 and received 2 wk of ertapenem via picc line through 8/25. On this admission, she is found to be afebrile, normotensive, LA 2/2, but WBC of 20K. She as given IVF and ceftriaxone. Infectious work up + esbl klebsiella pneumonaie on BCID. GNR on urine cx. ID asked to weigh in o why she is is having recurrence. No piror hx of having nephrolithiasis but may have bladder tumor.  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   HYPERLIPIDEMIA 03/27/2006   HYPERTENSION 03/27/2006   Intertrochanteric fracture of right hip (HCC) 07/13/2012   LUNG NODULE 09/01/2006   Excision, Bx Benighn   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"   Osteopenia 2004   Dexa 2004 showed Osteopenia, DEXA 03/2007 normal   Osteopenia    Other chronic cystitis with hematuria    Pelvic fracture (HCC) 06/16/2016   S/P fall   Recurrent urinary tract infection    Seeing Urology   RETINOPATHY, BACKGROUND NOS 03/27/2006   Seizures (HCC)    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    Allergies:  Allergies  Allergen Reactions   Procaine Hcl Anaphylaxis   Amoxicillin Itching     MEDICATIONS:  amiodarone  200 mg Oral Daily   apixaban  2.5 mg Oral BID   atorvastatin  40 mg Oral QHS   Chlorhexidine Gluconate Cloth  6 each Topical Q0600   ezetimibe  10 mg Oral Daily   feeding supplement (NEPRO CARB STEADY)  237 mL Oral BID BM   [START ON 11/24/2022] furosemide  80 mg Oral Q T,Th,S,Su   insulin aspart  0-6 Units Subcutaneous TID WC   insulin glargine-yfgn  5 Units Subcutaneous QHS   midodrine  10 mg Oral TID WC   pantoprazole  40 mg Oral Daily  polyethylene glycol  17 g Oral Daily   sodium chloride flush  3 mL Intravenous Q12H    Social History   Tobacco Use   Smoking status: Former    Current packs/day: 0.00    Types: Cigarettes    Quit date: 03/02/1978    Years since quitting: 44.7   Smokeless tobacco: Never  Vaping Use   Vaping status: Never Used  Substance Use Topics   Alcohol use: No    Alcohol/week: 0.0 standard drinks of alcohol   Drug use: No    Family History  Problem Relation Age of Onset   Diabetes Father    Heart attack Father 46   Diabetes Sister    Drug abuse Son    Cancer Neg Hx        no hx of colon or breast cancer    Review of Systems -  +myalgias  and feeling poorly. 12 point ros is negative  OBJECTIVE: Temp:  [97.1 F (36.2 C)-98.4 F (36.9 C)] 97.7 F (36.5 C) (09/23 1500) Pulse Rate:  [61-70] 66 (09/23 1400) Resp:  [11-20] 13 (09/23 1400) BP: (81-125)/(21-88) 125/45 (09/23 1400) SpO2:  [90 %-98 %] 91 % (09/23 1400) Weight:  [70.6 kg] 70.6 kg (09/23 0343) Physical Exam  Constitutional:  oriented to person, place, and time. appears well-developed and well-nourished. No distress.  HENT: [place]/AT, PERRLA, no scleral icterus Mouth/Throat: Oropharynx is clear and moist. No oropharyngeal exudate.  Cardiovascular: Normal rate, regular rhythm and normal heart sounds. Exam reveals no gallop and no friction rub.  No murmur heard.  Pulmonary/Chest: Effort normal and breath sounds normal. No respiratory distress.  has no wheezes.  Neck = supple, no nuchal rigidity Abdominal: Soft. Bowel sounds are normal.  exhibits no distension. There is no tenderness.  Lymphadenopathy: no cervical adenopathy. No axillary adenopathy Neurological: alert and oriented to person, place, and time.  Skin: Skin is warm and dry. No rash noted. No erythema.  Psychiatric: a normal mood and affect.  behavior is normal.    LABS: Results for orders placed or performed during the hospital encounter of 11/22/22 (from the past 48 hour(s))  Culture, blood (Routine x 2)     Status: None (Preliminary result)   Collection Time: 11/22/22  2:21 PM   Specimen: BLOOD RIGHT HAND  Result Value Ref Range   Specimen Description BLOOD RIGHT HAND    Special Requests      BOTTLES DRAWN AEROBIC AND ANAEROBIC Blood Culture adequate volume   Culture  Setup Time      GRAM NEGATIVE RODS AEROBIC BOTTLE ONLY CRITICAL RESULT CALLED TO, READ BACK BY AND VERIFIED WITH: PHARMD M. MITCHELL 11/23/22 @ 0559 BY AB Performed at [facility] Lab, [address], [place], [place] [address]    Culture GRAM NEGATIVE RODS    Report Status PENDING   Blood Culture ID Panel (Reflexed)     Status:  Abnormal   Collection Time: 11/22/22  2:21 PM  Result Value Ref Range   Enterococcus faecalis NOT DETECTED NOT DETECTED   Enterococcus Faecium NOT DETECTED NOT DETECTED   Listeria monocytogenes NOT DETECTED NOT DETECTED   Staphylococcus species NOT DETECTED NOT DETECTED   Staphylococcus aureus (BCID) NOT DETECTED NOT DETECTED   Staphylococcus epidermidis NOT DETECTED NOT DETECTED   Staphylococcus lugdunensis NOT DETECTED NOT DETECTED   Streptococcus species NOT DETECTED NOT DETECTED   Streptococcus agalactiae NOT DETECTED NOT DETECTED   Streptococcus pneumoniae NOT DETECTED NOT DETECTED   Streptococcus pyogenes NOT DETECTED  NOT DETECTED   A.calcoaceticus-baumannii NOT DETECTED NOT DETECTED   Bacteroides fragilis NOT DETECTED NOT DETECTED   Enterobacterales DETECTED (A) NOT DETECTED    Comment: Enterobacterales represent a large order of gram negative bacteria, not a single organism. CRITICAL RESULT CALLED TO, READ BACK BY AND VERIFIED WITH: PHARMD M. MITCHELL 11/23/22 @ 0559 BY AB    Enterobacter cloacae complex NOT DETECTED NOT DETECTED   Escherichia coli NOT DETECTED NOT DETECTED   Klebsiella aerogenes NOT DETECTED NOT DETECTED   Klebsiella oxytoca NOT DETECTED NOT DETECTED   Klebsiella pneumoniae DETECTED (A) NOT DETECTED    Comment: CRITICAL RESULT CALLED TO, READ BACK BY AND VERIFIED WITH: PHARMD M. MITCHELL 11/23/22 @ 0559 BY AB    Proteus species NOT DETECTED NOT DETECTED   Salmonella species NOT DETECTED NOT DETECTED   Serratia marcescens NOT DETECTED NOT DETECTED   Haemophilus influenzae NOT DETECTED NOT DETECTED   Neisseria meningitidis NOT DETECTED NOT DETECTED   Pseudomonas aeruginosa NOT DETECTED NOT DETECTED   Stenotrophomonas maltophilia NOT DETECTED NOT DETECTED   Candida albicans NOT DETECTED NOT DETECTED   Candida auris NOT DETECTED NOT DETECTED   Candida glabrata NOT DETECTED NOT DETECTED   Candida krusei NOT DETECTED NOT DETECTED   Candida parapsilosis NOT  DETECTED NOT DETECTED   Candida tropicalis NOT DETECTED NOT DETECTED   Cryptococcus neoformans/gattii NOT DETECTED NOT DETECTED   CTX-M ESBL DETECTED (A) NOT DETECTED    Comment: CRITICAL RESULT CALLED TO, READ BACK BY AND VERIFIED WITH: PHARMD M. MITCHELL 11/23/22 @ 0559 BY AB (NOTE) Extended spectrum beta-lactamase detected. Recommend a carbapenem as initial therapy.      Carbapenem resistance IMP NOT DETECTED NOT DETECTED   Carbapenem resistance KPC NOT DETECTED NOT DETECTED   Carbapenem resistance NDM NOT DETECTED NOT DETECTED   Carbapenem resist OXA 48 LIKE NOT DETECTED NOT DETECTED   Carbapenem resistance VIM NOT DETECTED NOT DETECTED    Comment: Performed at [facility], [address] [place], [place] [address]  Culture, blood (Routine x 2)     Status: None (Preliminary result)   Collection Time: 11/22/22  2:26 PM   Specimen: BLOOD  Result Value Ref Range   Specimen Description BLOOD RIGHT ANTECUBITAL    Special Requests NONE    Culture  Setup Time      GRAM NEGATIVE RODS AEROBIC BOTTLE ONLY CRITICAL VALUE NOTED.  VALUE IS CONSISTENT WITH PREVIOUSLY REPORTED AND CALLED VALUE. Performed at [facility] Lab, [address] [place], [place] [address]    Culture GRAM NEGATIVE RODS    Report Status PENDING   Comprehensive metabolic panel     Status: Abnormal   Collection Time: 11/22/22  3:20 PM  Result Value Ref Range   Sodium 132 (L) 135 - 145 mmol/L   Potassium 5.4 (H) 3.5 - 5.1 mmol/L    Comment: HEMOLYSIS AT THIS LEVEL MAY AFFECT RESULT   Chloride 93 (L) 98 - 111 mmol/L   CO2 27 22 - 32 mmol/L   Glucose, Bld 166 (H) 70 - 99 mg/dL    Comment: Glucose reference range applies only to samples taken after fasting for at least 8 hours.   BUN 32 (H) 8 - 23 mg/dL   Creatinine, Ser 6.96 (H) 0.44 - 1.00 mg/dL   Calcium 7.5 (L) 8.9 - 10.3 mg/dL   Total Protein 5.0 (L) 6.5 - 8.1 g/dL   Albumin <2.9 (L) 3.5 - 5.0 g/dL   AST 62 (H) 15 - 41  U/L    Comment: HEMOLYSIS  AT THIS LEVEL MAY AFFECT RESULT   ALT 19 0 - 44 U/L    Comment: HEMOLYSIS AT THIS LEVEL MAY AFFECT RESULT   Alkaline Phosphatase 394 (H) 38 - 126 U/L   Total Bilirubin 1.3 (H) 0.3 - 1.2 mg/dL    Comment: HEMOLYSIS AT THIS LEVEL MAY AFFECT RESULT   GFR, Estimated 19 (L) >60 mL/min    Comment: (NOTE) Calculated using the CKD-EPI Creatinine Equation (2021)    Anion gap 12 5 - 15    Comment: Performed at [facility] Lab, [address] [place], [place] [address]  CBC with Differential     Status: Abnormal   Collection Time: 11/22/22  3:20 PM  Result Value Ref Range   WBC 16.3 (H) 4.0 - 10.5 K/uL   RBC 3.24 (L) 3.87 - 5.11 MIL/uL   Hemoglobin 9.2 (L) 12.0 - 15.0 g/dL   HCT 62.1 (L) 30.8 - 65.7 %   MCV 90.4 80.0 - 100.0 fL   MCH 28.4 26.0 - 34.0 pg   MCHC 31.4 30.0 - 36.0 g/dL   RDW 84.6 (H) 96.2 - 95.2 %   Platelets 218 150 - 400 K/uL   nRBC 0.2 0.0 - 0.2 %   Neutrophils Relative % 91 %   Neutro Abs 14.8 (H) 1.7 - 7.7 K/uL   Lymphocytes Relative 5 %   Lymphs Abs 0.8 0.7 - 4.0 K/uL   Monocytes Relative 4 %   Monocytes Absolute 0.7 0.1 - 1.0 K/uL   Eosinophils Relative 0 %   Eosinophils Absolute 0.0 0.0 - 0.5 K/uL   Basophils Relative 0 %   Basophils Absolute 0.0 0.0 - 0.1 K/uL   nRBC 0 0 /100 WBC   Abs Immature Granulocytes 0.00 0.00 - 0.07 K/uL    Comment: Performed at [facility] Lab, [address], [place], [place] [address]  CK     Status: Abnormal   Collection Time: 11/22/22  3:20 PM  Result Value Ref Range   Total CK 29 (L) 38 - 234 U/L    Comment: HEMOLYSIS AT THIS LEVEL MAY AFFECT RESULT Performed at [facility] Lab, [address] [place], [place] [address]   Troponin I (High Sensitivity)     Status: Abnormal   Collection Time: 11/22/22  3:20 PM  Result Value Ref Range   Troponin I (High Sensitivity) 35 (H) <18 ng/L    Comment: (NOTE) Elevated high sensitivity troponin I (hsTnI) values and significant  changes across serial measurements may suggest  ACS but many other  chronic and acute conditions are known to elevate hsTnI results.  Refer to the "Links" section for chest pain algorithms and additional  guidance. Performed at [facility] Lab, [address], [place], [place] [address]   Lipase, blood     Status: None   Collection Time: 11/22/22  3:20 PM  Result Value Ref Range   Lipase 19 11 - 51 U/L    Comment: Performed at [facility] Lab, [address], [place], [place] [address]  Troponin I (High Sensitivity)     Status: Abnormal   Collection Time: 11/22/22  3:20 PM  Result Value Ref Range   Troponin I (High Sensitivity) 34 (H) <18 ng/L    Comment: (NOTE) Elevated high sensitivity troponin I (hsTnI) values and significant  changes across serial measurements may suggest ACS but many other  chronic and acute conditions are known to elevate hsTnI results.  Refer to the "Links" section for chest pain algorithms and additional  guidance. Performed at [facility], [address] [place], [place] [address]   T4, free     Status: None   Collection Time: 11/22/22  3:20 PM  Result Value Ref Range   Free T4 0.61 0.61 - 1.12 ng/dL    Comment: HEMOLYSIS AT THIS LEVEL MAY AFFECT RESULT (NOTE) Biotin ingestion may interfere with free T4 tests. If the results are inconsistent with the TSH level, previous test results, or the clinical presentation, then consider biotin interference. If needed, order repeat testing after stopping biotin. Performed at [facility] Lab, [address], [place], [place] [address]   SARS Coronavirus 2 by RT PCR (hospital order, performed in [facility] hospital lab) *cepheid single result test* Anterior Nasal Swab     Status: Abnormal   Collection Time: 11/22/22  3:33 PM   Specimen: Anterior Nasal Swab  Result Value Ref Range   SARS Coronavirus 2 by RT PCR POSITIVE (A) NEGATIVE    Comment: Performed at [facility] Lab, [address] [place], [place] [address]  I-Stat Lactic Acid,  ED     Status: Abnormal   Collection Time: 11/22/22  3:34 PM  Result Value Ref Range   Lactic Acid, Venous 2.2 (HH) 0.5 - 1.9 mmol/L   Comment NOTIFIED PHYSICIAN   Urinalysis, w/ Reflex to Culture (Infection Suspected) -Urine, Catheterized     Status: Abnormal   Collection Time: 11/22/22  4:21 PM  Result Value Ref Range   Specimen Source URINE, CLEAN CATCH    Color, Urine BROWN (A) YELLOW    Comment: BIOCHEMICALS MAY BE AFFECTED BY COLOR   APPearance TURBID (A) CLEAR   Specific Gravity, Urine RESULTS UNAVAILABLE DUE TO INTERFERING SUBSTANCE 1.005 - 1.030   pH RESULTS UNAVAILABLE DUE TO INTERFERING SUBSTANCE 5.0 - 8.0   Glucose, UA RESULTS UNAVAILABLE DUE TO INTERFERING SUBSTANCE (A) NEGATIVE mg/dL   Hgb urine dipstick RESULTS UNAVAILABLE DUE TO INTERFERING SUBSTANCE (A) NEGATIVE   Bilirubin Urine RESULTS UNAVAILABLE DUE TO INTERFERING SUBSTANCE (A) NEGATIVE   Ketones, ur RESULTS UNAVAILABLE DUE TO INTERFERING SUBSTANCE (A) NEGATIVE mg/dL   Protein, ur RESULTS UNAVAILABLE DUE TO INTERFERING SUBSTANCE (A) NEGATIVE mg/dL   Nitrite RESULTS UNAVAILABLE DUE TO INTERFERING SUBSTANCE (A) NEGATIVE   Leukocytes,Ua RESULTS UNAVAILABLE DUE TO INTERFERING SUBSTANCE (A) NEGATIVE   Squamous Epithelial / HPF 0-5 0 - 5 /HPF   WBC, UA >50 0 - 5 WBC/hpf    Comment: Reflex urine culture not performed if WBC <=10, OR if Squamous epithelial cells >5. If Squamous epithelial cells >5, suggest recollection.   RBC / HPF 21-50 0 - 5 RBC/hpf   Bacteria, UA FEW (A) NONE SEEN   Amorphous Crystal PRESENT     Comment: Performed at [facility] Lab, [address], [place], [place] [address]  Urine Culture     Status: Abnormal (Preliminary result)   Collection Time: 11/22/22  4:21 PM   Specimen: Urine, Random  Result Value Ref Range   Specimen Description URINE, RANDOM    Special Requests NONE Reflexed from [id number]    Culture (A)     >=100,000 COLONIES/mL GRAM NEGATIVE RODS CULTURE REINCUBATED FOR BETTER  GROWTH Performed at [facility], [address] [place], [place] [address]    Report Status PENDING   Brain natriuretic peptide     Status: Abnormal   Collection Time: 11/22/22  7:45 PM  Result Value  Ref Range   B Natriuretic Peptide 1,376.1 (H) 0.0 - 100.0 pg/mL    Comment: Performed at [facility], [address], [place], [place] [address]  Vitamin B12     Status: Abnormal   Collection Time: 11/22/22  7:45 PM  Result Value Ref Range   Vitamin B-12 4,144 (H) 180 - 914 pg/mL    Comment: (NOTE) This assay is not validated for testing neonatal or myeloproliferative syndrome specimens for Vitamin B12 levels. Performed at [facility], [address] [place], [place] [address]   Lactic acid, plasma     Status: None   Collection Time: 11/22/22  7:45 PM  Result Value Ref Range   Lactic Acid, Venous 1.6 0.5 - 1.9 mmol/L    Comment: Performed at [facility], [address], [place], [place] [address]  Hepatitis B surface antigen     Status: Abnormal   Collection Time: 11/22/22  7:45 PM  Result Value Ref Range   Hepatitis B Surface Ag Reactive (A) NON REACTIVE    Comment: Sample Positive for HBsAg. Result confirmed by neutralization. Performed at [facility], [address] [place], [place] [address]   TSH     Status: Abnormal   Collection Time: 11/22/22  7:46 PM  Result Value Ref Range   TSH 8.541 (H) 0.350 - 4.500 uIU/mL    Comment: Performed by a 3rd Generation assay with a functional sensitivity of <=0.01 uIU/mL. Performed at [facility], [address] [place], [place] [address]   Phosphorus     Status: Abnormal   Collection Time: 11/22/22  7:46 PM  Result Value Ref Range   Phosphorus 1.6 (L) 2.5 - 4.6 mg/dL    Comment: Performed at [facility] Lab, [address], [place], [place] [address]  Glucose, capillary     Status: Abnormal   Collection Time: 11/22/22 10:03 PM  Result Value Ref Range   Glucose-Capillary 157 (H) 70 - 99  mg/dL    Comment: Glucose reference range applies only to samples taken after fasting for at least 8 hours.  Lactic acid, plasma     Status: None   Collection Time: 11/22/22 10:37 PM  Result Value Ref Range   Lactic Acid, Venous 1.7 0.5 - 1.9 mmol/L    Comment: Performed at [facility], [address] [place], [place] [address]  Comprehensive metabolic panel     Status: Abnormal   Collection Time: 11/23/22  3:06 AM  Result Value Ref Range   Sodium 132 (L) 135 - 145 mmol/L   Potassium 4.6 3.5 - 5.1 mmol/L   Chloride 95 (L) 98 - 111 mmol/L   CO2 27 22 - 32 mmol/L   Glucose, Bld 141 (H) 70 - 99 mg/dL    Comment: Glucose reference range applies only to samples taken after fasting for at least 8 hours.   BUN 37 (H) 8 - 23 mg/dL   Creatinine, Ser 1.60 (H) 0.44 - 1.00 mg/dL   Calcium 7.6 (L) 8.9 - 10.3 mg/dL   Total Protein 4.7 (L) 6.5 - 8.1 g/dL   Albumin <1.0 (L) 3.5 - 5.0 g/dL   AST 58 (H) 15 - 41 U/L   ALT 22 0 - 44 U/L   Alkaline Phosphatase 370 (H) 38 - 126 U/L   Total Bilirubin 1.0 0.3 - 1.2 mg/dL   GFR, Estimated 17 (L) >60 mL/min    Comment: (NOTE) Calculated using the CKD-EPI  Creatinine Equation (2021)    Anion gap 10 5 - 15    Comment: Performed at [facility] Lab, [address], [place], [place] [address]  CBC     Status: Abnormal   Collection Time: 11/23/22  3:06 AM  Result Value Ref Range   WBC 20.2 (H) 4.0 - 10.5 K/uL   RBC 3.13 (L) 3.87 - 5.11 MIL/uL   Hemoglobin 9.2 (L) 12.0 - 15.0 g/dL   HCT 62.1 (L) 30.8 - 65.7 %   MCV 91.1 80.0 - 100.0 fL   MCH 29.4 26.0 - 34.0 pg   MCHC 32.3 30.0 - 36.0 g/dL   RDW 84.6 (H) 96.2 - 95.2 %   Platelets 239 150 - 400 K/uL   nRBC 0.3 (H) 0.0 - 0.2 %    Comment: Performed at [facility] Lab, [address] [place], [place] [address]  Glucose, capillary     Status: Abnormal   Collection Time: 11/23/22  6:47 AM  Result Value Ref Range   Glucose-Capillary 120 (H) 70 - 99 mg/dL    Comment: Glucose reference range  applies only to samples taken after fasting for at least 8 hours.  Glucose, capillary     Status: None   Collection Time: 11/23/22 12:12 PM  Result Value Ref Range   Glucose-Capillary 83 70 - 99 mg/dL    Comment: Glucose reference range applies only to samples taken after fasting for at least 8 hours.  Glucose, capillary     Status: Abnormal   Collection Time: 11/23/22  4:07 PM  Result Value Ref Range   Glucose-Capillary 121 (H) 70 - 99 mg/dL    Comment: Glucose reference range applies only to samples taken after fasting for at least 8 hours.   Comment 1 Notify RN    *Note: Due to a large number of results and/or encounters for the requested time period, some results have not been displayed. A complete set of results can be found in Results Review.    MICRO: 9/22 blood cx + kleb ESBL IMAGING: CT CHEST ABDOMEN PELVIS WO CONTRAST  Result Date: 11/23/2022 CLINICAL DATA:  Bacteremia. EXAM: CT CHEST, ABDOMEN AND PELVIS WITHOUT CONTRAST TECHNIQUE: Multidetector CT imaging of the chest, abdomen and pelvis was performed following the standard protocol without IV contrast. RADIATION DOSE REDUCTION: This exam was performed according to the departmental dose-optimization program which includes automated exposure control, adjustment of the mA and/or kV according to patient size and/or use of iterative reconstruction technique. COMPARISON:  October 07, 2022. FINDINGS: CT CHEST FINDINGS Cardiovascular: Atherosclerosis of thoracic aorta without aneurysm formation. Mild cardiomegaly. No pericardial effusion. Right internal jugular catheter noted with tip at cavoatrial junction. Coronary artery calcifications are noted. Mediastinum/Nodes: No enlarged mediastinal, hilar, or axillary lymph nodes. Thyroid gland, trachea, and esophagus demonstrate no significant findings. Lungs/Pleura: Small bilateral pleural effusions are noted with adjacent subsegmental atelectasis. No pneumothorax is noted. Mild patchy airspace  opacities are noted in the upper lobes concerning for multifocal pneumonia. Musculoskeletal: Interval development of minimally displaced manubrial fracture. CT ABDOMEN PELVIS FINDINGS Hepatobiliary: Cholelithiasis. Mild gallbladder distention. No biliary dilatation. Liver is unremarkable. Pancreas: Pancreatic atrophy is noted. 12 mm rounded density seen in pancreatic tail. Possible 18 mm rounded abnormality seen in pancreatic head. Spleen: Normal in size without focal abnormality. Adrenals/Urinary Tract: Adrenal glands appear normal. Right renal cysts are noted for which no further follow-up is required. Moderate left hydroureteronephrosis is noted without obstructing calculus. This appears to be due to irregular mass  along the left side of the urinary bladder highly concerning for malignancy. Stomach/Bowel: Stomach is within normal limits. Appendix appears normal. No evidence of bowel wall thickening, distention, or inflammatory changes. Vascular/Lymphatic: Aortic atherosclerosis. No enlarged abdominal or pelvic lymph nodes. Reproductive: Uterus and bilateral adnexa are unremarkable. Other: No abdominal wall hernia or abnormality. No abdominopelvic ascites. Musculoskeletal: No acute or significant osseous findings. IMPRESSION: Moderate left hydroureteronephrosis is noted which appears to be due to irregular mass in left side of urinary bladder concerning for malignancy. Cystoscopy is recommended. Small bilateral pleural effusions are noted with adjacent subsegmental atelectasis. Mild patchy airspace opacities are noted in the upper lobes bilaterally concerning for multifocal pneumonia. Minimally displaced manubrial fracture is now noted. 12 mm rounded density seen in pancreatic tail. Possible 18 mm rounded density seen in pancreatic head. MRI is recommended to rule out possible neoplasm. Cholelithiasis. Aortic Atherosclerosis (ICD10-I70.0). Electronically Signed   By: Lupita Raider M.D.   On: 11/23/2022 14:46    CT Head Wo Contrast  Result Date: 11/22/2022 CLINICAL DATA:  Headache, new onset (Age >= 51y) EXAM: CT HEAD WITHOUT CONTRAST TECHNIQUE: Contiguous axial images were obtained from the base of the skull through the vertex without intravenous contrast. RADIATION DOSE REDUCTION: This exam was performed according to the departmental dose-optimization program which includes automated exposure control, adjustment of the mA and/or kV according to patient size and/or use of iterative reconstruction technique. COMPARISON:  10/26/2022 FINDINGS: Brain: No evidence of acute infarction, hemorrhage, hydrocephalus, extra-axial collection or mass lesion/mass effect. Extensive left temporal lobe encephalomalacia, unchanged. Patchy low-density changes within the periventricular and subcortical white matter most compatible with chronic microvascular ischemic change. Mild diffuse cerebral volume loss. Vascular: Atherosclerotic calcifications involving the large vessels of the skull base. No unexpected hyperdense vessel. Skull: Normal. Negative for fracture or focal lesion. Sinuses/Orbits: No acute finding. Other: None. IMPRESSION: 1. No acute intracranial findings. 2. Chronic microvascular ischemic change and cerebral volume loss. 3. Chronic left temporal lobe encephalomalacia. Electronically Signed   By: Duanne Guess D.O.   On: 11/22/2022 17:33   DG Chest Port 1 View  Result Date: 11/22/2022 CLINICAL DATA:  [id number] Sepsis [facility]) [id number] EXAM: PORTABLE CHEST 1 VIEW COMPARISON:  October 26, 2022 FINDINGS: The cardiomediastinal silhouette is unchanged and enlarged in contour.RIGHT chest CVC with tip terminating over the superior cavoatrial junction. Small LEFT pleural effusion. No pneumothorax. Persistent LEFT retrocardiac opacity. Mild interstitial prominence. Atherosclerotic calcifications. IMPRESSION: 1. Persistent LEFT retrocardiac opacity with small LEFT pleural effusion. Findings likely reflect atelectasis. 2. Chronic  background of mild pulmonary edema. Electronically Signed   By: Meda Klinefelter M.D.   On: 11/22/2022 15:44     Assessment/Plan:  82yo F with multiple comorbidities, recently treated for covid-19 admitted for myalgia, malaise, and dysuria found to have ESBL klebsiella bacteremia likely secondary to uti. Imaging shows bladder lesions causing left hydroureternephrosis, which possibly maybe the cause of her recurrent infections ( 3 in the last 2-3 months) - would recommend to continue with meropenem - may need to replace out her HD line - would recommend to see if urology feels that she would benefit from ureter stent or other interventions that may help reduce frequency of recurrent infection - needs contact isolation for MDRO  Covid-19 = does not need isolation, represents recovered covid illness from last month   Hep b surface ag positivity = unclear if she recently had hep b vaccine. She was non-reactive 4 wks ago. Will check hep B VL. Nephrology team to  follow protocol for hd  Othell Jaime B. Drue Second MD MPH [facility] [phone]

## 2022-11-23 NOTE — Progress Notes (Signed)
Called Loreen Freud for verbal consent for HD related to patient states to let me sleep and refuses to open eyes or eat dinner. Grandaughter with spouse and asked that she be added to contacts. Her number is [phone].

## 2022-11-23 NOTE — Assessment & Plan Note (Signed)
Mild stable left hydronephrosis has been present since May.  Imaging (CT and renal US x2) since then has showed no other findings until CT this admission showed a likely associated bladder mass.  Discussed with Urology.  No biopsy possible while treating infection.  Hydronephrosis is stable, does not appear obstructive, stenting not indicated.  Urology recommended she should follow up with Dr. Pete Glatter after discharge.  As above, it is very unclear that she is a surgical candidate in any case. - Outpatient follow up

## 2022-11-23 NOTE — Progress Notes (Signed)
Heart Failure Navigator Progress Note  Assessed for Heart & Vascular TOC clinic readiness.  Patient does not meet criteria due to ESRD on hemodialysis.   Navigator will sign off at this time.    Dawn Fields, BSN, RN Heart Failure Nurse Navigator Secure Chat Only   

## 2022-11-23 NOTE — Hospital Course (Addendum)
[name] [name] is a 82 y.o. 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  Patient presented secondary to severe myalgias, dysuria, fatigue and was found to have evidence of sepsis.  Workup revealed evidence of ESBL Klebsiella pneumonia bacteremia in addition to UTI infectious ease was consulted.  Appropriate antibiotics prescribed.  During hospitalization, patient was transitioned to comfort measures secondary to poor toleration of hemodialysis. Patient now transitioned to comfort measures.

## 2022-11-23 NOTE — Progress Notes (Signed)
Patient has recovered covid. Covid illness in late August. Does not need airborne/contact isolation  Due to ESBL bacteremia - she will need contact isolation   Raylan Troiani B. Drue Second MD MPH [facility] [phone]

## 2022-11-23 NOTE — Plan of Care (Signed)
  Problem: Respiratory: Goal: Will maintain a patent airway Outcome: Progressing   Problem: Education: Goal: Knowledge of disease or condition will improve Outcome: Progressing Goal: Knowledge of secondary prevention will improve (MUST DOCUMENT ALL) Outcome: Progressing Goal: Knowledge of patient specific risk factors will improve Loraine Leriche N/A or DELETE if not current risk factor) Outcome: Progressing   Problem: Ischemic Stroke/TIA Tissue Perfusion: Goal: Complications of ischemic stroke/TIA will be minimized Outcome: Progressing   Problem: Coping: Goal: Will verbalize positive feelings about self Outcome: Progressing Goal: Will identify appropriate support needs Outcome: Progressing   Problem: Health Behavior/Discharge Planning: Goal: Ability to manage health-related needs will improve Outcome: Progressing Goal: Goals will be collaboratively established with patient/family Outcome: Progressing   Problem: Self-Care: Goal: Ability to participate in self-care as condition permits will improve Outcome: Progressing Goal: Verbalization of feelings and concerns over difficulty with self-care will improve Outcome: Progressing Goal: Ability to communicate needs accurately will improve Outcome: Progressing

## 2022-11-23 NOTE — Progress Notes (Signed)
PHARMACY - PHYSICIAN COMMUNICATION CRITICAL VALUE ALERT - BLOOD CULTURE IDENTIFICATION (BCID)  [name] [name] is an 82 y.o. female who presented to [facility] on 11/22/2022 with a chief complaint of myalgia, dysuria  Assessment:  2/4 BCX: Klebsiella pneumoniae with CTX-M detected  Name of physician (or Provider) Contacted: Dow Adolph   Current antibiotics: meropenem 500 g IV q 12h   Changes to prescribed antibiotics recommended: n/a adequate coverage w/ merrem   Results for orders placed or performed during the hospital encounter of 10/07/22  Blood Culture ID Panel (Reflexed) (Collected: 10/07/2022  3:29 PM)  Result Value Ref Range   Enterococcus faecalis NOT DETECTED NOT DETECTED   Enterococcus Faecium NOT DETECTED NOT DETECTED   Listeria monocytogenes NOT DETECTED NOT DETECTED   Staphylococcus species NOT DETECTED NOT DETECTED   Staphylococcus aureus (BCID) NOT DETECTED NOT DETECTED   Staphylococcus epidermidis NOT DETECTED NOT DETECTED   Staphylococcus lugdunensis NOT DETECTED NOT DETECTED   Streptococcus species NOT DETECTED NOT DETECTED   Streptococcus agalactiae NOT DETECTED NOT DETECTED   Streptococcus pneumoniae NOT DETECTED NOT DETECTED   Streptococcus pyogenes NOT DETECTED NOT DETECTED   A.calcoaceticus-baumannii NOT DETECTED NOT DETECTED   Bacteroides fragilis NOT DETECTED NOT DETECTED   Enterobacterales DETECTED (A) NOT DETECTED   Enterobacter cloacae complex NOT DETECTED NOT DETECTED   Escherichia coli NOT DETECTED NOT DETECTED   Klebsiella aerogenes NOT DETECTED NOT DETECTED   Klebsiella oxytoca NOT DETECTED NOT DETECTED   Klebsiella pneumoniae DETECTED (A) NOT DETECTED   Proteus species NOT DETECTED NOT DETECTED   Salmonella species NOT DETECTED NOT DETECTED   Serratia marcescens NOT DETECTED NOT DETECTED   Haemophilus influenzae NOT DETECTED NOT DETECTED   Neisseria meningitidis NOT DETECTED NOT DETECTED   Pseudomonas aeruginosa NOT DETECTED NOT DETECTED    Stenotrophomonas maltophilia NOT DETECTED NOT DETECTED   Candida albicans NOT DETECTED NOT DETECTED   Candida auris NOT DETECTED NOT DETECTED   Candida glabrata NOT DETECTED NOT DETECTED   Candida krusei NOT DETECTED NOT DETECTED   Candida parapsilosis NOT DETECTED NOT DETECTED   Candida tropicalis NOT DETECTED NOT DETECTED   Cryptococcus neoformans/gattii NOT DETECTED NOT DETECTED   CTX-M ESBL DETECTED (A) NOT DETECTED   Carbapenem resistance IMP NOT DETECTED NOT DETECTED   Carbapenem resistance KPC NOT DETECTED NOT DETECTED   Carbapenem resistance NDM NOT DETECTED NOT DETECTED   Carbapenem resist OXA 48 LIKE NOT DETECTED NOT DETECTED   Carbapenem resistance VIM NOT DETECTED NOT DETECTED    Calton Dach, PharmD, BCCCP Clinical Pharmacist 11/23/2022 5:59 AM

## 2022-11-23 NOTE — Progress Notes (Signed)
CSW confirmed with [facility] that pt is STR there.

## 2022-11-23 NOTE — Progress Notes (Signed)
Progress Note   Patient: [name] [name]:[id number] DOB: [birth year] DOA: 11/22/2022     0 DOS: the patient was seen and examined on 11/23/2022        Brief hospital course: [name] is an 82 y.o. F with advanced CKD now on HD, sCHF EF 25%, hx HSV encephalitis 2014, blind left eye, pAF on Eliquis, amiodarone, IDDM, seizures, recurrent ESBL bacteremia, and recent stroke, recent COVID who presented from SNF due to severe myalgias/diffuse aches, dysuria, fatigue.  In the ER, WBC 16K, Lactate 2.2, CXR ?edema/pneumonia.  Given fluids and Nephrology consulted for HD.     Significant events: 7/7-7/24: Admitted for dizziness, found to have stroke, then incidentally found to have ESBL bacteremia; worsening renal insufficiency requiring initiation of iHD this admission; discharged to [facility]   8/7-8/17: Readmitted after LOC at dialysis center, received CPR x10 minutes, no epi; Cardiology and Nephrology consulted, AGAIN found to have ESBL bacteremia; tunneled HD cath removed, discharged to SNF with PICC/ertanemen EOT 8/25   8/26: Readmitted with frequent hypoglycemias, hypoxia --> COVID+       Significant studies: 9/22 CXR: Edema vs pneumonia 9/22 CT head: No change, old temporal encephalomalacia   Significant microbiology data: 7/7 BCx x2: Klebsiella ESBL in 2/2 7/7 UCx: Klebsiella ESBL 7/8 BCx x2: Klebsiella ESBL in 1/2 7/13 BCx x2: Staph epi in 1/2 8/7 BCx single: Klebsiella ESBL in 1/1 8/7 MRSA nares: Negative 8/13 BCx x2: NG 8/26 COVID PCR: Positive 8/26 BCx x2: NG 9/22 UCx: Pending 9/22 COVID PCR: Still positive 9/22 BCx x2: Klebsiella ESBL in 2/2   Procedures:    Consults: Nephrology ID      Assessment and Plan: * Severe sepsis due to recurrent ESBL bacteremia    UTI (urinary tract infection) in setting of recurrent UTI - Continue meropenem - Consult ID, appreciate cares       Bladder tumor Discussed with Urology.  No biopsy possible while treating  infection.  She should follow up with Dr. Pete Glatter after discharge. 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), stage IV requiring dialysis [facility]) - Consult Nephrology for HD  Abnormal results of thyroid function studies - Follow TFTs  Type 2 diabetes mellitus with chronic kidney disease, with long-term current use of insulin (HCC) Last A1c 9.7, recurrent hypoglycemia in the setting of ESBL bacteremia, COVID and FTT - Continue glargine - Continue SS correction insulin  Paroxysmal atrial fibrillation (HCC) - Continue Eliquis - Continue amiodarone   Chronic systolic CHF (congestive heart failure) (HCC) - Continue fluid management per HD   CAD S/P percutaneous coronary angioplasty with mildly elevatd troponin Elevated troponin due to renal failure, no ischemia.   - Continue Lipitor, Zetia  Anemia due to chronic kidney disease    Hyperlipidemia    Acute metabolic encephalopathy At abseline patient is interactive, has no cognitive impairment.  Here she is somnolent, slowed and unable to respond appropraitely due to infection.       Subjective: Patient is somnolent.  No fever, no respiratory symptoms.  Oral intake poor.     Physical Exam: BP (!) 125/45   Pulse 66   Temp 97.7 F (36.5 C) (Oral)   Resp 13   Ht 5\' 9"  (1.753 m)   Wt 70.6 kg   SpO2 91%   BMI 22.98 kg/m   General: Pt is somnolent, lying in bed, weak Cardiovascular: RRR, nl S1-S2, no murmurs appreciated.   No LE edema.  Respiratory: Normal respiratory rate and rhythm.  Lungs very diminshed, no rales or wheezes noted Abdominal: Abdomen soft and non-tender.  No distension or HSM.   Neuro/Psych: Strength symmetric in upper and lower extremities.  Judgment and insight appear impaired.  Severe generalized weakness, symmetric, speech fluent but severe psychomotor slowing noted..   Data Reviewed: Discussed with ID, Urology BMP shows Cr and K  normal BNP very elevated Lactic acid improved to normal Albumin down to <1.5 WBC 20K, Hgb 9.2  Family Communication: Called to husband, no answer.  Spoke with son by phone.    Disposition: Status is: Inpatient         Author: Alberteen Sam, MD 11/23/2022 5:02 PM  For on call review [email or web address].

## 2022-11-23 NOTE — Progress Notes (Addendum)
[name] [name]  Assessment/ Plan: Pt is a 82 y.o. yo female   OP HD: SW MWF  3.5h   400/600   67kg   3K/2.5 bath  TDC  Heparin none - last OP HD 9/20, post wt 68.4kg - dry wt lowered 74 --> 67kg in last 3 wks - mircera 100 mcg IV q 2 wks, last 9/16, due 9/30  # Acute hypoxic resp failure/ pulmonary edema:  UF with HD, currently on around 2 to 3 L of oxygen.  # Covid infection - remains + from Aug 2024 acute infection.  Per primary team.  #ESRD - on HD MWF. HD today.  On Lasix during nondialysis days.  #Chronic hypotension - cont midodrine, dose increase.  Monitor BP.  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  Next esa is due 9/30.    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  Repeat lab.  # UTI: On ceftriaxone.  # DNR  Subjective: Seen and examined.  Complaining of shortness of breath and using around 3 L of oxygen.  No new event, plan for dialysis today. Objective Vital signs in last 24 hours: Vitals:   11/23/22 0158 11/23/22 0300 11/23/22 0343 11/23/22 0750  BP: (!) 108/40 (!) 107/54 (!) 110/56 (!) 102/39  Pulse: 61 63 68 64  Resp: 14 11 15 18   Temp:  97.6 F (36.4 C) (!) 97.5 F (36.4 C) 98.4 F (36.9 C)  TempSrc:   Axillary Oral  SpO2: 98% 93% 90% 93%  Weight:   70.6 kg   Height:       Weight change:  No intake or output data in the 24 hours ending 11/23/22 1033     Labs: RENAL PANEL Recent Labs  Lab 11/22/22 1520 11/22/22 1946 11/23/22 0306  NA 132*  --  132*  K 5.4*  --  4.6  CL 93*  --  95*  CO2 27  --  27  GLUCOSE 166*  --  141*  BUN 32*  --  37*  CREATININE 2.51*  --  2.79*  CALCIUM 7.5*  --  7.6*  PHOS  --  1.6*  --   ALBUMIN <1.5*  --  <1.5*    Liver Function Tests: Recent Labs  Lab 11/22/22 1520 11/23/22 0306  AST 62* 58*  ALT 19 22  ALKPHOS 394* 370*  BILITOT 1.3* 1.0  PROT 5.0* 4.7*  ALBUMIN <1.5* <1.5*   Recent Labs  Lab 11/22/22 1520  LIPASE 19   No results for input(s): "AMMONIA" in the  last 168 hours. CBC: Recent Labs    08/09/22 1126 08/10/22 0105 09/07/22 0644 09/08/22 0244 09/09/22 0307 10/09/22 0223 10/09/22 0224 10/09/22 0555 10/09/22 1757 10/27/22 0527 10/28/22 0355 10/30/22 0720 11/22/22 1520 11/22/22 1945 11/23/22 0306  HGB  --    < > 7.1* 8.1*   < >  --    < >  --    < > 8.7* 8.7* 10.9* 9.2*  --  9.2*  MCV  --    < > 85.7 83.7   < >  --    < >  --    < > 92.9 92.7 89.7 90.4  --  91.1  VITAMINB12 1,892*  --  1,280*  --   --   --   --  744  --   --   --   --   --  4,144*  --   FOLATE 19.6  --  11.6  --   --   --   --  12.0  --   --   --   --   --   --   --   FERRITIN 177  --   --  136  --   --   --  269  --   --   --   --   --   --   --   TIBC 213*  --   --  157*  --   --   --  119*  --   --   --   --   --   --   --   IRON 33  --   --  20*  --   --   --  17*  --   --   --   --   --   --   --   RETICCTPCT 2.4  --   --   --   --  3.8*  --   --   --   --   --   --   --   --   --    < > = values in this interval not displayed.    Cardiac Enzymes: Recent Labs  Lab 11/22/22 1520  CKTOTAL 29*   CBG: Recent Labs  Lab 11/22/22 2203 11/23/22 0647  GLUCAP 157* 120*    Iron Studies: No results for input(s): "IRON", "TIBC", "TRANSFERRIN", "FERRITIN" in the last 72 hours. Studies/Results: CT Head Wo Contrast  Result Date: 11/22/2022 CLINICAL DATA:  Headache, new onset (Age >= 51y) EXAM: CT HEAD WITHOUT CONTRAST TECHNIQUE: Contiguous axial images were obtained from the base of the skull through the vertex without intravenous contrast. RADIATION DOSE REDUCTION: This exam was performed according to the departmental dose-optimization program which includes automated exposure control, adjustment of the mA and/or kV according to patient size and/or use of iterative reconstruction technique. COMPARISON:  10/26/2022 FINDINGS: Brain: No evidence of acute infarction, hemorrhage, hydrocephalus, extra-axial collection or mass lesion/mass effect. Extensive left temporal  lobe encephalomalacia, unchanged. Patchy low-density changes within the periventricular and subcortical white matter most compatible with chronic microvascular ischemic change. Mild diffuse cerebral volume loss. Vascular: Atherosclerotic calcifications involving the large vessels of the skull base. No unexpected hyperdense vessel. Skull: Normal. Negative for fracture or focal lesion. Sinuses/Orbits: No acute finding. Other: None. IMPRESSION: 1. No acute intracranial findings. 2. Chronic microvascular ischemic change and cerebral volume loss. 3. Chronic left temporal lobe encephalomalacia. Electronically Signed   By: Duanne Guess D.O.   On: 11/22/2022 17:33   DG Chest Port 1 View  Result Date: 11/22/2022 CLINICAL DATA:  [id number] Sepsis [facility]) [id number] EXAM: PORTABLE CHEST 1 VIEW COMPARISON:  October 26, 2022 FINDINGS: The cardiomediastinal silhouette is unchanged and enlarged in contour.RIGHT chest CVC with tip terminating over the superior cavoatrial junction. Small LEFT pleural effusion. No pneumothorax. Persistent LEFT retrocardiac opacity. Mild interstitial prominence. Atherosclerotic calcifications. IMPRESSION: 1. Persistent LEFT retrocardiac opacity with small LEFT pleural effusion. Findings likely reflect atelectasis. 2. Chronic background of mild pulmonary edema. Electronically Signed   By: Meda Klinefelter M.D.   On: 11/22/2022 15:44    Medications: Infusions:  sodium chloride     meropenem (MERREM) IV Stopped (11/22/22 2124)    Scheduled Medications:  amiodarone  200 mg Oral Daily   apixaban  2.5 mg Oral BID   atorvastatin  40 mg Oral QHS   Chlorhexidine Gluconate Cloth  6 each Topical Q0600   ezetimibe  10 mg Oral Daily   [START ON 11/24/2022]  furosemide  80 mg Oral Q T,Th,S,Su   insulin aspart  0-6 Units Subcutaneous TID WC   insulin glargine-yfgn  5 Units Subcutaneous QHS   midodrine  10 mg Oral TID WC   pantoprazole  40 mg Oral Daily   polyethylene glycol  17 g Oral Daily    sodium chloride flush  3 mL Intravenous Q12H    have reviewed scheduled and prn medications.  Physical Exam: General:NAD, comfortable Heart:RRR, s1s2 nl Lungs: Bibasal rhonchi, no increased work of breathing Abdomen:soft, Non-tender, non-distended Extremities:No edema Dialysis Access: Right IJ TDC in place.  [name] [name] 11/23/2022,10:33 AM  LOS: 0 days

## 2022-11-24 DIAGNOSIS — D494 Neoplasm of unspecified behavior of bladder: Secondary | ICD-10-CM

## 2022-11-24 DIAGNOSIS — G9341 Metabolic encephalopathy: Secondary | ICD-10-CM | POA: Insufficient documentation

## 2022-11-24 DIAGNOSIS — D631 Anemia in chronic kidney disease: Secondary | ICD-10-CM

## 2022-11-24 DIAGNOSIS — I9589 Other hypotension: Secondary | ICD-10-CM

## 2022-11-24 DIAGNOSIS — R946 Abnormal results of thyroid function studies: Secondary | ICD-10-CM

## 2022-11-24 DIAGNOSIS — N184 Chronic kidney disease, stage 4 (severe): Secondary | ICD-10-CM

## 2022-11-24 DIAGNOSIS — Z992 Dependence on renal dialysis: Secondary | ICD-10-CM

## 2022-11-24 DIAGNOSIS — Z9861 Coronary angioplasty status: Secondary | ICD-10-CM

## 2022-11-24 DIAGNOSIS — B961 Klebsiella pneumoniae [K. pneumoniae] as the cause of diseases classified elsewhere: Secondary | ICD-10-CM | POA: Diagnosis not present

## 2022-11-24 DIAGNOSIS — I5022 Chronic systolic (congestive) heart failure: Secondary | ICD-10-CM

## 2022-11-24 DIAGNOSIS — N186 End stage renal disease: Secondary | ICD-10-CM

## 2022-11-24 DIAGNOSIS — N390 Urinary tract infection, site not specified: Secondary | ICD-10-CM | POA: Diagnosis not present

## 2022-11-24 DIAGNOSIS — K219 Gastro-esophageal reflux disease without esophagitis: Secondary | ICD-10-CM

## 2022-11-24 DIAGNOSIS — R6520 Severe sepsis without septic shock: Secondary | ICD-10-CM | POA: Diagnosis not present

## 2022-11-24 DIAGNOSIS — A419 Sepsis, unspecified organism: Secondary | ICD-10-CM | POA: Diagnosis not present

## 2022-11-24 DIAGNOSIS — I2510 Atherosclerotic heart disease of native coronary artery without angina pectoris: Secondary | ICD-10-CM

## 2022-11-24 LAB — CBC
HCT: 31.5 % — ABNORMAL LOW (ref 36.0–46.0)
Hemoglobin: 10.3 g/dL — ABNORMAL LOW (ref 12.0–15.0)
MCH: 29.3 pg (ref 26.0–34.0)
MCHC: 32.7 g/dL (ref 30.0–36.0)
MCV: 89.7 fL (ref 80.0–100.0)
Platelets: 222 10*3/uL (ref 150–400)
RBC: 3.51 MIL/uL — ABNORMAL LOW (ref 3.87–5.11)
RDW: 19.8 % — ABNORMAL HIGH (ref 11.5–15.5)
WBC: 13.8 10*3/uL — ABNORMAL HIGH (ref 4.0–10.5)
nRBC: 0.8 % — ABNORMAL HIGH (ref 0.0–0.2)

## 2022-11-24 LAB — BASIC METABOLIC PANEL
Anion gap: 11 (ref 5–15)
BUN: 18 mg/dL (ref 8–23)
CO2: 24 mmol/L (ref 22–32)
Calcium: 7.4 mg/dL — ABNORMAL LOW (ref 8.9–10.3)
Chloride: 98 mmol/L (ref 98–111)
Creatinine, Ser: 1.55 mg/dL — ABNORMAL HIGH (ref 0.44–1.00)
GFR, Estimated: 34 mL/min — ABNORMAL LOW (ref >60)
Glucose, Bld: 72 mg/dL (ref 70–99)
Potassium: 3.7 mmol/L (ref 3.5–5.1)
Sodium: 133 mmol/L — ABNORMAL LOW (ref 135–145)

## 2022-11-24 LAB — GLUCOSE, CAPILLARY: Glucose-Capillary: 92 mg/dL (ref 70–99)

## 2022-11-24 LAB — HEPATITIS B SURFACE ANTIBODY, QUANTITATIVE: Hep B S AB Quant (Post): <3.5 m[IU]/mL — ABNORMAL LOW

## 2022-11-24 MED ORDER — VANCOMYCIN HCL 1500 MG/300ML IV SOLN
1500.0000 mg | Freq: Once | INTRAVENOUS | Status: DC
  Administered 2022-11-24: 1500 mg via INTRAVENOUS
  Filled 2022-11-24: qty 300

## 2022-11-24 MED ORDER — HEPARIN SODIUM (PORCINE) 1000 UNIT/ML DIALYSIS
3200.0000 [IU] | INTRAMUSCULAR | Status: DC | PRN
  Administered 2022-11-24: 3200 [IU]
  Filled 2022-11-24 (×2): qty 4

## 2022-11-24 MED ORDER — SODIUM CHLORIDE 0.9 % IV SOLN
500.0000 mg | INTRAVENOUS | Status: DC
  Filled 2022-11-24: qty 10

## 2022-11-24 MED ORDER — FENTANYL CITRATE PF 50 MCG/ML IJ SOSY
25.0000 ug | PREFILLED_SYRINGE | INTRAMUSCULAR | Status: DC | PRN
  Administered 2022-11-24 – 2022-11-26 (×8): 25 ug via INTRAVENOUS
  Filled 2022-11-24 (×8): qty 1

## 2022-11-24 MED ORDER — SODIUM CHLORIDE 0.9 % IV BOLUS
500.0000 mL | Freq: Once | INTRAVENOUS | Status: AC
  Administered 2022-11-24: 500 mL via INTRAVENOUS

## 2022-11-24 MED ORDER — CHLORHEXIDINE GLUCONATE CLOTH 2 % EX PADS
6.0000 | MEDICATED_PAD | Freq: Every day | CUTANEOUS | Status: DC
  Administered 2022-11-25 – 2022-11-26 (×2): 6 via TOPICAL

## 2022-11-24 MED ORDER — VANCOMYCIN HCL 750 MG/150ML IV SOLN: 750.0000 mg | INTRAVENOUS | Status: DC

## 2022-11-24 MED ORDER — HEPARIN SODIUM (PORCINE) 1000 UNIT/ML DIALYSIS: 1000.0000 [IU] | INTRAMUSCULAR | Status: DC | PRN

## 2022-11-24 NOTE — Progress Notes (Signed)
   11/24/22 0226  Vitals  Temp 98.9 F (37.2 C)  Temp Source Axillary  BP (!) 109/43  MAP (mmHg) (!) 63  BP Location Left Arm  BP Method Automatic  Patient Position (if appropriate) Lying  Pulse Rate 65  Pulse Rate Source Monitor  ECG Heart Rate 65  Resp 13  Oxygen Therapy  SpO2 100 %  O2 Device Nasal Cannula  O2 Flow Rate (L/min) 2 L/min  Patient Activity (if Appropriate) In bed  Pulse Oximetry Type Continuous  Time-Out for Dialysis  What Procedure? Hemodialysis  Pt Identifiers(min of two) First/Last Name;MRN/Account#  Correct Site? Yes  Correct Side? Yes  Correct Procedure? Yes  Consents Verified? Yes  Rad Studies Available? N/A  Safety Precautions Reviewed? Yes  Biochemist, clinical Number 4 (MC TABLO 4)  Bay/Room Number 20 [phone])  Alarm Test Passed  Chlorine/Chloramine Test Passed  Conductivity: Machine  13.7  Dialyzer Lot Number [id number]  Disposable Set Lot Number [id number]  Dialysate Acid Bath Lot Number [id number]  Dialysate HCO3 Bath Lot Number [id number]  Machine Temperature 98.6 F (37 C)  Pre Treatment  Is pt a NEW START this admission?  No  Name of patient's outpatient clinic: SW  What is patient's outpatient schedule? MWF  Vascular access used during treatment Catheter  HD catheter dressing before treatment WDL  Patient is receiving dialysis in a chair No  Hemodialysis Consent Verified Yes  Hemodialysis Standing Orders Initiated Yes  ECG (Telemetry) Monitor On Yes  Prime Ordered Normal Saline  Length of  DialysisTreatment -hour(s) 3.5 Hour(s)  Dialysis mode HD  Dialyzer Revaclear 300 (if < 80 kg use 300, if > 80 kg use 400; wt = 70.6 kg)  Dialysate 2K;2.5 Ca (K+ Per Protocol; Potassium level = 4.6 mmol/L)  Dialysis Anticoagulation Automated NS Flushes  Dialysate Flow Ordered 300  Blood Flow Rate Ordered 400 mL/min  Ultrafiltration Goal 2.5 Liters (2 - 2.5 L)  Blood Pressure Parameters SBP > 90  Dialysis Blood Pressure Support  Ordered Normal Saline  Hepatitis B Pre Treatment Patient Checks  Hepatitis B Surface Antigen Results Reactive (s/p Hepatitis B vaccine on 11/17/20 at OP Clinic)  Date Hepatitis B Surface Antigen Drawn 11/22/22  Hep B Antibody Quant/Post Pending  Date Hep B Antibody Quant/Post Drawn 11/22/22  Patient's Immunity Status Susceptible  Isolation Initiated No  Note  Patient Observations Patient resting, no acute distress noted; condition stable for this treatment iniitiation.  Hemodialysis Catheter Right Internal jugular Double lumen Permanent (Tunneled)  Placement Date/Time: 10/16/22 0841   Placed prior to admission: No  Serial / Lot #: [id number]  Expiration Date: 03/02/27  Time Out: Correct patient;Correct site;Correct procedure  Maximum sterile barrier precautions: Hand hygiene;Cap;Mask;Sterile gown...  Site Condition No complications  Blue Lumen Status Blood return noted;Flushed  Red Lumen Status Blood return noted;Flushed  Purple Lumen Status N/A  Dressing Type Transparent  Dressing Status Antimicrobial disc in place;Clean, Dry, Intact  Drainage Description None  Dressing Change Due 11/30/22

## 2022-11-24 NOTE — Consult Note (Signed)
Value-Based Care Institute  [facility] Inpatient Consult   11/24/2022  [name] [name] Sep 10, 1940 [id number]  [facility] [[facility]]  Accountable Care Organization [ACO] Patient: Medicare ACO REACH  Extreme high score for unplanned readmission risk and readmission in less than 30 days with 7 admissions in 6 months  Primary Care Provider:  [name], MD with [place] at [facility]  Patient was active with [facility] [[facility]] Care Management for care coordination services.  Patient has been engaged by a [employer] and followed by the [facility] RN while at [facility].   The community based plan of care has focused on disease management and community resource support needs for post facility.    Chart review reveals patient transitioning to comfort measure.  Plan: Update the [facility] Coordination team of any follow up needs.   Of note, [facility] Care Management services does not replace or interfere with any services that are needed or arranged by inpatient [facility] care management team.   Charlesetta Shanks, RN, BSN, CCM [place]  [facility], [facility] Liaison Direct Dial: [phone] or secure chat Website: [name]

## 2022-11-24 NOTE — Progress Notes (Signed)
Pharmacy Antibiotic Note  [name] [name] is a 82 y.o. female admitted on 11/22/2022 presenting with fatigue, body aches, concern for infection. New ESBL sepsis - meropenem started 9/22, continues to worsen - adding vancomycin 9/24.  ESRD - MWF HD  Plan: Merrem 500mg  IV q 24 hours Vancomycin 1500 mg iv x 1 now then 750 mg iv Q HD Monitor renal function, Cx and clinical progression   Height: 5\' 9"  (175.3 cm) Weight: 73.1 kg (161 lb 2.5 oz) IBW/kg (Calculated) : 66.2  Temp (24hrs), Avg:98 F (36.7 C), Min:97.5 F (36.4 C), Max:98.9 F (37.2 C)  Recent Labs  Lab 11/22/22 1520 11/22/22 1534 11/22/22 1945 11/22/22 2237 11/23/22 0306 11/24/22 0803  WBC 16.3*  --   --   --  20.2* 13.8*  CREATININE 2.51*  --   --   --  2.79*  --   LATICACIDVEN  --  2.2* 1.6 1.7  --   --     Estimated Creatinine Clearance: 16.8 mL/min (A) (by C-G formula based on SCr of 2.79 mg/dL (H)).    Allergies  Allergen Reactions   Procaine Hcl Anaphylaxis   Amoxicillin Itching    Thank you Okey Regal, PharmD 11/24/2022 9:24 AM

## 2022-11-24 NOTE — Progress Notes (Signed)
PER Susann Givens, [place] pt received energix vaccination 11/08/22.

## 2022-11-24 NOTE — Progress Notes (Signed)
PHARMACY NOTE:  ANTIMICROBIAL RENAL DOSAGE ADJUSTMENT  Current antimicrobial regimen includes a mismatch between antimicrobial dosage and estimated renal function.  As per policy approved by the Pharmacy & Therapeutics and Medical Executive Committees, the antimicrobial dosage will be adjusted accordingly.  Current antimicrobial dosage:  Meropenem 500 mg IV q12h  Indication: ESBL Klebsiella bacteremia + UTI  Renal Function:  Estimated Creatinine Clearance: 16.8 mL/min (A) (by C-G formula based on SCr of 2.79 mg/dL (H)). [x]      On intermittent HD, scheduled: MWF    Antimicrobial dosage has been changed to:  Meropenem 500 mg IV q24h  Additional comments:  Thank you for allowing pharmacy to be a part of this patient's care.  Lora Paula, PharmD PGY-2 Infectious Diseases Pharmacy Resident 11/24/2022 8:22 AM

## 2022-11-24 NOTE — Progress Notes (Signed)
[facility]    Date of Admission:  11/22/2022   Total days of antibiotics 3          ID: [name] [name] is a 82 y.o. 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  Discussion with attending and family for goals of care is underway  Husband is at her bedside, realizing patient will not survive this infectious process  Medications:   amiodarone  200 mg Oral Daily   apixaban  2.5 mg Oral BID   atorvastatin  40 mg Oral QHS   Chlorhexidine Gluconate Cloth  6 each Topical Q0600   Chlorhexidine Gluconate Cloth  6 each Topical Q0600   ezetimibe  10 mg Oral Daily   feeding supplement (NEPRO CARB STEADY)  237 mL Oral BID BM   insulin aspart  0-6 Units Subcutaneous TID WC   insulin glargine-yfgn  5 Units Subcutaneous QHS   midodrine  10 mg Oral TID WC   pantoprazole  40 mg Oral Daily   polyethylene glycol  17 g Oral Daily   sodium chloride flush  3 mL Intravenous Q12H    Objective: Vital signs in last 24 hours: Temp:  [97.5 F (36.4 C)-98.9 F (37.2 C)] 98.4 F (36.9 C) (09/24 0630) Pulse Rate:  [61-118] 118 (09/24 0630) Resp:  [12-20] 14 (09/24 0630) BP:  (73-125)/(37-59) 100/59 (09/24 0630) SpO2:  [91 %-100 %] 94 % (09/24 0630) Weight:  [73.1 kg] 73.1 kg (09/24 0427)  Physical Exam  Constitutional:  appears well-developed and well-nourished. No distress.  HENT: [place]/AT, PERRLA, no scleral icterus Cardiovascular: tachycardic, regular rhythm and normal heart sounds. Exam reveals no gallop and no friction rub.  No murmur heard.  Pulmonary/Chest: Effort normal and breath sounds normal. No respiratory distress.  has no wheezes.     Lab Results Recent Labs    11/22/22 1520 11/23/22 0306 11/24/22 0803  WBC 16.3* 20.2* 13.8*  HGB 9.2* 9.2* 10.3*  HCT 29.3* 28.5* 31.5*  NA 132* 132*  --   K 5.4* 4.6  --   CL 93* 95*  --   CO2 27 27  --   BUN 32* 37*  --   CREATININE 2.51* 2.79*  --    Liver Panel Recent Labs    11/22/22 1520 11/23/22 0306  PROT 5.0* 4.7*  ALBUMIN <1.5* <1.5*  AST 62* 58*  ALT 19 22  ALKPHOS 394* 370*  BILITOT 1.3* 1.0   Sedimentation Rate No results for input(s): "ESRSEDRATE" in the last 72 hours. C-Reactive Protein No results for input(s): "CRP" in the last 72 hours.  Microbiology: reviewed Studies/Results: CT CHEST ABDOMEN PELVIS WO CONTRAST  Result Date: 11/23/2022 CLINICAL DATA:  Bacteremia. EXAM: CT  CHEST, ABDOMEN AND PELVIS WITHOUT CONTRAST TECHNIQUE: Multidetector CT imaging of the chest, abdomen and pelvis was performed following the standard protocol without IV contrast. RADIATION DOSE REDUCTION: This exam was performed according to the departmental dose-optimization program which includes automated exposure control, adjustment of the mA and/or kV according to patient size and/or use of iterative reconstruction technique. COMPARISON:  October 07, 2022. FINDINGS: CT CHEST FINDINGS Cardiovascular: Atherosclerosis of thoracic aorta without aneurysm formation. Mild cardiomegaly. No pericardial effusion. Right internal jugular catheter noted with tip at cavoatrial junction. Coronary artery calcifications are  noted. Mediastinum/Nodes: No enlarged mediastinal, hilar, or axillary lymph nodes. Thyroid gland, trachea, and esophagus demonstrate no significant findings. Lungs/Pleura: Small bilateral pleural effusions are noted with adjacent subsegmental atelectasis. No pneumothorax is noted. Mild patchy airspace opacities are noted in the upper lobes concerning for multifocal pneumonia. Musculoskeletal: Interval development of minimally displaced manubrial fracture. CT ABDOMEN PELVIS FINDINGS Hepatobiliary: Cholelithiasis. Mild gallbladder distention. No biliary dilatation. Liver is unremarkable. Pancreas: Pancreatic atrophy is noted. 12 mm rounded density seen in pancreatic tail. Possible 18 mm rounded abnormality seen in pancreatic head. Spleen: Normal in size without focal abnormality. Adrenals/Urinary Tract: Adrenal glands appear normal. Right renal cysts are noted for which no further follow-up is required. Moderate left hydroureteronephrosis is noted without obstructing calculus. This appears to be due to irregular mass along the left side of the urinary bladder highly concerning for malignancy. Stomach/Bowel: Stomach is within normal limits. Appendix appears normal. No evidence of bowel wall thickening, distention, or inflammatory changes. Vascular/Lymphatic: Aortic atherosclerosis. No enlarged abdominal or pelvic lymph nodes. Reproductive: Uterus and bilateral adnexa are unremarkable. Other: No abdominal wall hernia or abnormality. No abdominopelvic ascites. Musculoskeletal: No acute or significant osseous findings. IMPRESSION: Moderate left hydroureteronephrosis is noted which appears to be due to irregular mass in left side of urinary bladder concerning for malignancy. Cystoscopy is recommended. Small bilateral pleural effusions are noted with adjacent subsegmental atelectasis. Mild patchy airspace opacities are noted in the upper lobes bilaterally concerning for multifocal pneumonia. Minimally displaced manubrial  fracture is now noted. 12 mm rounded density seen in pancreatic tail. Possible 18 mm rounded density seen in pancreatic head. MRI is recommended to rule out possible neoplasm. Cholelithiasis. Aortic Atherosclerosis (ICD10-I70.0). Electronically Signed   By: Lupita Raider M.D.   On: 11/23/2022 14:46   CT Head Wo Contrast  Result Date: 11/22/2022 CLINICAL DATA:  Headache, new onset (Age >= 51y) EXAM: CT HEAD WITHOUT CONTRAST TECHNIQUE: Contiguous axial images were obtained from the base of the skull through the vertex without intravenous contrast. RADIATION DOSE REDUCTION: This exam was performed according to the departmental dose-optimization program which includes automated exposure control, adjustment of the mA and/or kV according to patient size and/or use of iterative reconstruction technique. COMPARISON:  10/26/2022 FINDINGS: Brain: No evidence of acute infarction, hemorrhage, hydrocephalus, extra-axial collection or mass lesion/mass effect. Extensive left temporal lobe encephalomalacia, unchanged. Patchy low-density changes within the periventricular and subcortical white matter most compatible with chronic microvascular ischemic change. Mild diffuse cerebral volume loss. Vascular: Atherosclerotic calcifications involving the large vessels of the skull base. No unexpected hyperdense vessel. Skull: Normal. Negative for fracture or focal lesion. Sinuses/Orbits: No acute finding. Other: None. IMPRESSION: 1. No acute intracranial findings. 2. Chronic microvascular ischemic change and cerebral volume loss. 3. Chronic left temporal lobe encephalomalacia. Electronically Signed   By: Duanne Guess D.O.   On: 11/22/2022 17:33   DG Chest Port 1 View  Result Date: 11/22/2022 CLINICAL DATA:  [id number] Sepsis (  [facility]) [id number] EXAM: PORTABLE CHEST 1 VIEW COMPARISON:  October 26, 2022 FINDINGS: The cardiomediastinal silhouette is unchanged and enlarged in contour.RIGHT chest CVC with tip terminating over the superior  cavoatrial junction. Small LEFT pleural effusion. No pneumothorax. Persistent LEFT retrocardiac opacity. Mild interstitial prominence. Atherosclerotic calcifications. IMPRESSION: 1. Persistent LEFT retrocardiac opacity with small LEFT pleural effusion. Findings likely reflect atelectasis. 2. Chronic background of mild pulmonary edema. Electronically Signed   By: Meda Klinefelter M.D.   On: 11/22/2022 15:44     Assessment/Plan: Recurrent ESBL kleb bacteremia from urinary source= currently on meropenem but agree with primary team that there is likely no definitive treatment given her frequent recurrence and aggressive urologic interventions needed  Agree with goals of care and change to comfort care  [facility] for Infectious Diseases Pager: [phone]  11/24/2022, 11:21 AM

## 2022-11-24 NOTE — Assessment & Plan Note (Addendum)
At baseline patient is interactive, has no cognitive impairment. In last month, she is weaker and more slowed but oriented.  Worse today

## 2022-11-24 NOTE — Progress Notes (Signed)
   11/24/22 0245  Assess: MEWS Score  BP (!) 80/41  MAP (mmHg) (!) 54  Pulse Rate 63  ECG Heart Rate 63  Resp 12  SpO2 100 %  Assess: MEWS Score  MEWS Temp 0  MEWS Systolic 2  MEWS Pulse 0  MEWS RR 1  MEWS LOC 2  MEWS Score 5  MEWS Score Color Red  Assess: SIRS CRITERIA  SIRS Temperature  0  SIRS Pulse 0  SIRS Respirations  0  SIRS WBC 0  SIRS Score Sum  0   Dialysis RN at bedside during low b/p and map readings. Pt currently has eyes open but still not willing to speak or answer questions. Pt will moan and verbalize she is in pain during peri-care and repositioning.

## 2022-11-24 NOTE — Progress Notes (Addendum)
Progress Note   Patient: [name] [name] YQM:[id number] DOB: 04/18/1940 DOA: 11/22/2022     1 DOS: the patient was seen and examined on 11/24/2022        Brief hospital course: Mrs. Brienza is an 82 y.o. F with advanced CKD now on HD, sCHF EF 25%, hx HSV encephalitis 2014, blind left eye, pAF on Eliquis, amiodarone, IDDM, seizures, recurrent ESBL bacteremia, and recent stroke, recent COVID who presented from SNF due to severe myalgias/diffuse aches, dysuria, fatigue.  In the ER, WBC 16K, Lactate 2.2, CXR ?edema/pneumonia.  Given fluids and Nephrology consulted for HD.     Significant events: 7/7-7/24: Admitted for dizziness, found to have stroke, then incidentally found to have ESBL bacteremia; worsening renal insufficiency requiring initiation of iHD this admission; discharged to [facility]   8/7-8/17: Readmitted after LOC at dialysis center, received CPR x10 minutes, no epi; Cardiology and Nephrology consulted, AGAIN found to have ESBL bacteremia; tunneled HD cath removed, discharged to SNF with PICC/ertanemen EOT 8/25   8/26-8/30: Readmitted with frequent hypoglycemias, hypoxia --> COVID+     9/22: Readmitted with malaise, dysuria, started meropenem; ESBL in BCx, ID consulted 9/23: CT shows bladder mass 9/24: Decompensating slowly during early AM HD; hypotensive and tachycardic     Significant studies: 9/22 CXR: Edema vs pneumonia 9/22 CT head: No change, old temporal encephalomalacia 9/23 CT C/A/P: ?pneumonia, also unchanged hydronephrosis and what now appears to be a bladder mass   Significant microbiology data: 7/7 BCx x2: Klebsiella ESBL in 2/2 7/7 UCx: Klebsiella ESBL 7/8 BCx x2: Klebsiella ESBL in 1/2 7/13 BCx x2: Staph epi in 1/2 8/7 BCx single: Klebsiella ESBL in 1/1 8/7 MRSA nares: Negative 8/13 BCx x2: NG 8/26 COVID PCR: Positive 8/26 BCx x2: NG  9/22 UCx: GNRs 9/22 COVID PCR: Still positive 9/22 BCx x2: Klebsiella ESBL in 2/2   Procedures:     Consults: Nephrology ID      Assessment and Plan: * 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    Urology indicated that intervention would not be offered while she was being treated for active infection.  Even looking beyond the acute phase, it is unclear if she is truly a candidate for cystectomy at this point, given her worsening functional status and hemodynamic instability over the last month.  Overnight much more hypotensive with HD, then tachycardic.  Appears unwell.  - Continue meropenem - Add vancomycin to cover pneumonia - IV fluids  - With regard to recurrent ESBL infections: discussed with ID, given we suspect the recurrence has to do with the bladder mass, it is very unclear what the longer term management of the infection would be --> 2 weeks meropenem should adequately treat acute infection, after which options might include stopping meropenem or continuing total 4 weeks and proceeding with expedited Urologic work up (unlikely feasible); unclear that Hipprex is safe in ESRD    Bladder tumor Mild stable left hydronephrosis has been present since May.  Imaging (CT and renal US x2) since then has showed no other findings until CT this admission showed a likely associated bladder mass.  Discussed with Urology.  No biopsy possible while treating infection.  Hydronephrosis is stable, does not appear obstructive, stenting not indicated.  Urology recommended she should follow up with Dr. Pete Glatter after discharge.  As above, it is very unclear  that she is a surgical candidate in any case. 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 In last month, she is weaker and more slowed but oriented.  Worse today in setting of worsening hemodynamics.   Type 2 diabetes mellitus with chronic kidney disease, with long-term current use of insulin (HCC) Last A1c 9.7, recurrent hypoglycemia in the setting of ESBL bacteremia, COVID and FTT - Continue glargine - Continue SS correction insulin  Chronic systolic CHF (congestive heart failure) (HCC) Appears anasarcic despite hypotension, tachycardia.  Abnormal results of thyroid function studies TSH 8, improved from prior.  FT4 normal.  Chronic hypotension Worsened after HD. - Continue midodrine  Anemia due to chronic kidney disease    Paroxysmal atrial fibrillation (HCC) Rates up after HD - IV fluids - Continue Eliquis and amiodarone if able to take PO   CAD S/P percutaneous coronary angioplasty with mildly elevatd troponin Elevated troponin due to renal failure, no ischemia.   - Continue Lipitor, Zetia  Hyperlipidemia            Subjective: Overnight the patient on dialysis became hypotensive and tachycardic.  This morning she is poorly responsive and asking to "let me die".     Physical Exam: BP (!) 100/59 (BP Location: Right Arm)   Pulse (!) 118   Temp 98.4 F (36.9 C) (Oral)   Resp 14   Ht 5\' 9"  (1.753 m)   Wt 73.1 kg   SpO2 94%   BMI 23.80 kg/m   Pale frail elderly female, lying in bed, appears anasarcic and staring off into space Tachycardic, anasarca, pitting edema in extremities Respiratory effort shallow and diminished Abdomen without grimace to palpation Listless, does not follow commands, mumbles "let me die", but does not answer questions appropriately    Data Reviewed: Discussed with infectious disease, critical care Patient metabolic panel and CBC ordered CT abdomen and pelvis from yesterday reviewed and summarized above  Family Communication: Grandson by  phone    Disposition: Status is: Inpatient Patient was admitted for recurrent ESBL bacteremia  Despite appropriate treatment, she started to deteriorate hemodynamically with dialysis this morning  Sadly there is no reasonable expectation that she can recuperate enough to undergo surgery for her underlying medical illness (bladder mass leading to recurrent ESBL bacteremia) even if she were to recover from this acute episode. I recommend Hospice.  The patient is critically ill with multi-organ failure.  Critical care was necessary to treat or prevent imminent or life-threatening deterioration of sepsis, cardiac failure, renal failure and was exclusive of separately billable procedures and treating other patients. Total critical care time spent by me: 55 minutes Time spent personally by me on obtaining history from patient or surrogate, evaluation of the patient, evaluation of patient's response to treatment, ordering and review of laboratory studies, ordering and review of radiographic studies, ordering and performing treatments and interventions, and re-evaluation of the patient's condition.        Author: Alberteen Sam, MD 11/24/2022 9:35 AM  For on call review [email or web address].

## 2022-11-24 NOTE — Progress Notes (Addendum)
This chaplain responded to the unit consult for EOL spiritual care. The chaplain understands the Pt. is Catholic and requesting Anointing of the Sick.    The Pt. husband-[name] [name] is at the bedside. [name] [name] shared the Pt. faith community is [employer] in [place]. The chaplain understands the priest is available on Wednesday at 1200 or 1430. The Pt. family is hoping for a priest visit today. The chaplain contacted Father Ree Kida to check his availability.   This chaplain will update the unit as plans are confirmed.  **[id number] Father Ree Kida will visit at 1730 today. The family was updated and accepted the visit. Father Ree Kida will communicate with the priest at [facility] [place].  Chaplain Stephanie Acre [phone]

## 2022-11-24 NOTE — Progress Notes (Signed)
Received patient at bedside on 3E20C-01.  Patient moans with painful stimulus.  Informed consent signed and in chart.   TX duration: 3:30  Patient unable to meet prescribed UF goal d/t hypotensive BP.  Patient resting, condition stable. Asleep, without acute distress.  Hand-off given to patient's nurse.   Access used: RIJ TDC Access issues: None  Total UF removed: 0 Medication(s) given: None Post HD VS: please see data insert    11/24/22 0630  Vitals  Temp 98.4 F (36.9 C)  Temp Source Oral  BP (!) 100/59  MAP (mmHg) 73  BP Location Right Arm  BP Method Automatic  Patient Position (if appropriate) Lying  Pulse Rate (!) 118  Pulse Rate Source Monitor  ECG Heart Rate (!) 115  Resp 14  Oxygen Therapy  SpO2 94 %  O2 Device Nasal Cannula  O2 Flow Rate (L/min) 2 L/min  Patient Activity (if Appropriate) In bed  Pulse Oximetry Type Continuous  Post Treatment  Dialyzer Clearance Lightly streaked  Hemodialysis Intake (mL) 600 mL  Liters Processed 76.3  Fluid Removed (mL) 0 mL  Tolerated HD Treatment Yes  Post-Hemodialysis Comments Treatment complete; unable to meet UF goal d/t hypotensive BP, and blood returned.  Note  Patient Observations Patient resting, no acute distress noted, BP improved with rinseback; conditon stable upon this treatment d/c.  Hemodialysis Catheter Right Internal jugular Double lumen Permanent (Tunneled)  Placement Date/Time: 10/16/22 0841   Placed prior to admission: No  Serial / Lot #: [id number]  Expiration Date: 03/02/27  Time Out: Correct patient;Correct site;Correct procedure  Maximum sterile barrier precautions: Hand hygiene;Cap;Mask;Sterile gown...  Site Condition No complications  Blue Lumen Status Flushed;Heparin locked;Dead end cap in place  Red Lumen Status Flushed;Heparin locked;Dead end cap in place  Purple Lumen Status N/A  Catheter fill solution Heparin 1000 units/ml  Catheter fill volume (Arterial) 1.6 cc  Catheter fill volume  (Venous) 1.6  Dressing Type Transparent  Dressing Status Antimicrobial disc in place;Clean, Dry, Intact  Drainage Description None  Post treatment catheter status Capped and Clamped      [facility]

## 2022-11-24 NOTE — Progress Notes (Signed)
[facility] NEPHROLOGY PROGRESS NOTE  Assessment/ Plan: Pt is a 82 y.o. yo female   OP HD: SW MWF  3.5h   400/600   67kg   3K/2.5 bath  TDC  Heparin none - last OP HD 9/20, post wt 68.4kg - dry wt lowered 74 --> 67kg in last 3 wks - mircera 100 mcg IV q 2 wks, last 9/16, due 9/30  # Acute hypoxic resp failure/ pulmonary edema:  UF with HD, currently on around 2 to 3 L of oxygen.  # Covid infection - remains + from Aug 2024 represents recovered COVID illness from last month, seen by ID no need for isolation.   #ESRD - on HD MWF.  Status post dialysis yesterday with no total fluid removal because of intradialytic hypotension.  Plan for next HD tomorrow.  On Lasix during nondialysis days.  #Chronic hypotension - cont midodrine, dose increased to 10 mg 3 times daily.  Monitor BP.  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  Repeat lab.  # Severe sepsis due to recurrent ESBL bacteremia, recurrent UTI/bladder tumor: Per urology, no biopsy while treating infection, seems like plan to follow outpatient by urology.  # DNR/GOC: Recommend palliative care consult.  Subjective: Seen and examined.  She had dialysis yesterday developed intradialytic hypotension therefore no UF.  She was quite somnolent and lethargic this morning. Objective Vital signs in last 24 hours: Vitals:   11/24/22 0545 11/24/22 0600 11/24/22 0615 11/24/22 0630  BP: (!) 78/52 (!) 80/48 (!) 83/47 (!) 100/59  Pulse: (!) 116 90 87 (!) 118  Resp: 14 15 15 14   Temp:    98.4 F (36.9 C)  TempSrc:    Oral  SpO2: 91% 93% 93% 94%  Weight:      Height:       Weight change: 1.1 kg  Intake/Output Summary (Last 24 hours) at 11/24/2022 1031 Last data filed at 11/24/2022 0630 Gross per 24 hour  Intake 420 ml  Output 0 ml  Net 420 ml       Labs: RENAL PANEL Recent Labs  Lab  11/22/22 1520 11/22/22 1946 11/23/22 0306  NA 132*  --  132*  K 5.4*  --  4.6  CL 93*  --  95*  CO2 27  --  27  GLUCOSE 166*  --  141*  BUN 32*  --  37*  CREATININE 2.51*  --  2.79*  CALCIUM 7.5*  --  7.6*  PHOS  --  1.6*  --   ALBUMIN <1.5*  --  <1.5*    Liver Function Tests: Recent Labs  Lab 11/22/22 1520 11/23/22 0306  AST 62* 58*  ALT 19 22  ALKPHOS 394* 370*  BILITOT 1.3* 1.0  PROT 5.0* 4.7*  ALBUMIN <1.5* <1.5*   Recent Labs  Lab 11/22/22 1520  LIPASE 19   No results for input(s): "AMMONIA" in the last 168 hours. CBC: Recent Labs    08/09/22 1126 08/10/22 0105 09/07/22 0644 09/08/22 0244 09/09/22 0307 10/09/22 0223 10/09/22 0224 10/09/22 0555 10/09/22 1757 10/28/22 0355 10/30/22 0720 11/22/22 1520 11/22/22 1945 11/23/22 0306 11/24/22 0803  HGB  --    < > 7.1* 8.1*   < >  --    < >  --    < > 8.7* 10.9* 9.2*  --  9.2* 10.3*  MCV  --    < >  85.7 83.7   < >  --    < >  --    < > 92.7 89.7 90.4  --  91.1 89.7  VITAMINB12 1,892*  --  1,280*  --   --   --   --  744  --   --   --   --  4,144*  --   --   FOLATE 19.6  --  11.6  --   --   --   --  12.0  --   --   --   --   --   --   --   FERRITIN 177  --   --  136  --   --   --  269  --   --   --   --   --   --   --   TIBC 213*  --   --  157*  --   --   --  119*  --   --   --   --   --   --   --   IRON 33  --   --  20*  --   --   --  17*  --   --   --   --   --   --   --   RETICCTPCT 2.4  --   --   --   --  3.8*  --   --   --   --   --   --   --   --   --    < > = values in this interval not displayed.    Cardiac Enzymes: Recent Labs  Lab 11/22/22 1520  CKTOTAL 29*   CBG: Recent Labs  Lab 11/23/22 0647 11/23/22 1212 11/23/22 1607 11/23/22 2047 11/24/22 0610  GLUCAP 120* 83 121* 141* 92    Iron Studies: No results for input(s): "IRON", "TIBC", "TRANSFERRIN", "FERRITIN" in the last 72 hours. Studies/Results: CT CHEST ABDOMEN PELVIS WO CONTRAST  Result Date: 11/23/2022 CLINICAL DATA:   Bacteremia. EXAM: CT CHEST, ABDOMEN AND PELVIS WITHOUT CONTRAST TECHNIQUE: Multidetector CT imaging of the chest, abdomen and pelvis was performed following the standard protocol without IV contrast. RADIATION DOSE REDUCTION: This exam was performed according to the departmental dose-optimization program which includes automated exposure control, adjustment of the mA and/or kV according to patient size and/or use of iterative reconstruction technique. COMPARISON:  October 07, 2022. FINDINGS: CT CHEST FINDINGS Cardiovascular: Atherosclerosis of thoracic aorta without aneurysm formation. Mild cardiomegaly. No pericardial effusion. Right internal jugular catheter noted with tip at cavoatrial junction. Coronary artery calcifications are noted. Mediastinum/Nodes: No enlarged mediastinal, hilar, or axillary lymph nodes. Thyroid gland, trachea, and esophagus demonstrate no significant findings. Lungs/Pleura: Small bilateral pleural effusions are noted with adjacent subsegmental atelectasis. No pneumothorax is noted. Mild patchy airspace opacities are noted in the upper lobes concerning for multifocal pneumonia. Musculoskeletal: Interval development of minimally displaced manubrial fracture. CT ABDOMEN PELVIS FINDINGS Hepatobiliary: Cholelithiasis. Mild gallbladder distention. No biliary dilatation. Liver is unremarkable. Pancreas: Pancreatic atrophy is noted. 12 mm rounded density seen in pancreatic tail. Possible 18 mm rounded abnormality seen in pancreatic head. Spleen: Normal in size without focal abnormality. Adrenals/Urinary Tract: Adrenal glands appear normal. Right renal cysts are noted for which no further follow-up is required. Moderate left hydroureteronephrosis is noted without obstructing calculus. This appears to be due to irregular mass along the left side of the urinary bladder highly concerning for malignancy.  Stomach/Bowel: Stomach is within normal limits. Appendix appears normal. No evidence of bowel wall  thickening, distention, or inflammatory changes. Vascular/Lymphatic: Aortic atherosclerosis. No enlarged abdominal or pelvic lymph nodes. Reproductive: Uterus and bilateral adnexa are unremarkable. Other: No abdominal wall hernia or abnormality. No abdominopelvic ascites. Musculoskeletal: No acute or significant osseous findings. IMPRESSION: Moderate left hydroureteronephrosis is noted which appears to be due to irregular mass in left side of urinary bladder concerning for malignancy. Cystoscopy is recommended. Small bilateral pleural effusions are noted with adjacent subsegmental atelectasis. Mild patchy airspace opacities are noted in the upper lobes bilaterally concerning for multifocal pneumonia. Minimally displaced manubrial fracture is now noted. 12 mm rounded density seen in pancreatic tail. Possible 18 mm rounded density seen in pancreatic head. MRI is recommended to rule out possible neoplasm. Cholelithiasis. Aortic Atherosclerosis (ICD10-I70.0). Electronically Signed   By: Lupita Raider M.D.   On: 11/23/2022 14:46   CT Head Wo Contrast  Result Date: 11/22/2022 CLINICAL DATA:  Headache, new onset (Age >= 51y) EXAM: CT HEAD WITHOUT CONTRAST TECHNIQUE: Contiguous axial images were obtained from the base of the skull through the vertex without intravenous contrast. RADIATION DOSE REDUCTION: This exam was performed according to the departmental dose-optimization program which includes automated exposure control, adjustment of the mA and/or kV according to patient size and/or use of iterative reconstruction technique. COMPARISON:  10/26/2022 FINDINGS: Brain: No evidence of acute infarction, hemorrhage, hydrocephalus, extra-axial collection or mass lesion/mass effect. Extensive left temporal lobe encephalomalacia, unchanged. Patchy low-density changes within the periventricular and subcortical white matter most compatible with chronic microvascular ischemic change. Mild diffuse cerebral volume loss.  Vascular: Atherosclerotic calcifications involving the large vessels of the skull base. No unexpected hyperdense vessel. Skull: Normal. Negative for fracture or focal lesion. Sinuses/Orbits: No acute finding. Other: None. IMPRESSION: 1. No acute intracranial findings. 2. Chronic microvascular ischemic change and cerebral volume loss. 3. Chronic left temporal lobe encephalomalacia. Electronically Signed   By: Duanne Guess D.O.   On: 11/22/2022 17:33   DG Chest Port 1 View  Result Date: 11/22/2022 CLINICAL DATA:  [id number] Sepsis [facility]) [id number] EXAM: PORTABLE CHEST 1 VIEW COMPARISON:  October 26, 2022 FINDINGS: The cardiomediastinal silhouette is unchanged and enlarged in contour.RIGHT chest CVC with tip terminating over the superior cavoatrial junction. Small LEFT pleural effusion. No pneumothorax. Persistent LEFT retrocardiac opacity. Mild interstitial prominence. Atherosclerotic calcifications. IMPRESSION: 1. Persistent LEFT retrocardiac opacity with small LEFT pleural effusion. Findings likely reflect atelectasis. 2. Chronic background of mild pulmonary edema. Electronically Signed   By: Meda Klinefelter M.D.   On: 11/22/2022 15:44    Medications: Infusions:  sodium chloride     meropenem (MERREM) IV     vancomycin 1,500 mg (11/24/22 1012)   [START ON 11/25/2022] vancomycin      Scheduled Medications:  amiodarone  200 mg Oral Daily   apixaban  2.5 mg Oral BID   atorvastatin  40 mg Oral QHS   Chlorhexidine Gluconate Cloth  6 each Topical Q0600   ezetimibe  10 mg Oral Daily   feeding supplement (NEPRO CARB STEADY)  237 mL Oral BID BM   insulin aspart  0-6 Units Subcutaneous TID WC   insulin glargine-yfgn  5 Units Subcutaneous QHS   midodrine  10 mg Oral TID WC   pantoprazole  40 mg Oral Daily   polyethylene glycol  17 g Oral Daily   sodium chloride flush  3 mL Intravenous Q12H    have reviewed scheduled and prn medications.  Physical Exam:  General: Somnolent female, not really  responding. Heart:RRR, s1s2 nl Lungs: Bibasal rhonchi, no increased work of breathing Abdomen:soft, Non-tender, non-distended Extremities:No edema Dialysis Access: Right IJ TDC in place.  Day Greb Prasad Karolynn Infantino 11/24/2022,10:31 AM  LOS: 1 day

## 2022-11-25 ENCOUNTER — Inpatient Hospital Stay (HOSPITAL_COMMUNITY): Payer: Medicare Other

## 2022-11-25 DIAGNOSIS — R6520 Severe sepsis without septic shock: Secondary | ICD-10-CM | POA: Diagnosis not present

## 2022-11-25 DIAGNOSIS — A419 Sepsis, unspecified organism: Secondary | ICD-10-CM | POA: Diagnosis not present

## 2022-11-25 LAB — CULTURE, BLOOD (ROUTINE X 2): Special Requests: ADEQUATE

## 2022-11-25 LAB — HEPATITIS B DNA, ULTRAQUANTITATIVE, PCR
HBV DNA SERPL PCR-ACNC: NOT DETECTED IU/mL
HBV DNA SERPL PCR-LOG IU: UNDETERMINED log10 IU/mL

## 2022-11-25 LAB — GLUCOSE, CAPILLARY
Glucose-Capillary: 58 mg/dL — ABNORMAL LOW (ref 70–99)
Glucose-Capillary: 108 mg/dL — ABNORMAL HIGH (ref 70–99)
Glucose-Capillary: 110 mg/dL — ABNORMAL HIGH (ref 70–99)

## 2022-11-25 LAB — URINE CULTURE: Culture: >=100000 — AB

## 2022-11-25 MED ORDER — SODIUM CHLORIDE 0.9 % IV SOLN
500.0000 mg | Freq: Every day | INTRAVENOUS | Status: DC
  Administered 2022-11-25 – 2022-11-27 (×3): 500 mg via INTRAVENOUS
  Filled 2022-11-25 (×3): qty 10

## 2022-11-25 MED ORDER — NEPRO/CARBSTEADY PO LIQD
237.0000 mL | Freq: Two times a day (BID) | ORAL | Status: DC
  Administered 2022-11-26: 237 mL via ORAL

## 2022-11-25 NOTE — Progress Notes (Signed)
PROGRESS NOTE    [name] [name]  WUJ:[id number] DOB: 06/08/1940 DOA: 11/22/2022 PCP: [name], MD   Brief Narrative: [name] [name] is a 82 y.o. 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  Patient presented secondary to severe myalgias, dysuria, fatigue and was found to have evidence of sepsis.  Workup revealed evidence of ESBL Klebsiella pneumonia bacteremia in addition to UTI infectious ease was consulted.  Appropriate antibiotics prescribed.  During hospitalization, patient was transitioned to comfort measures secondary to poor toleration of hemodialysis.  Goals of care unclear at this time as patient is not definitive if she wants comfort measures at this time.    Assessment and Plan:  Severe sepsis Present on admission.  Secondary to ESBL Klebsiella pneumonia bacteremia from urinary source.  Patient managed on meropenem for treatment.  ESBL Klebsiella pneumoniae bacteremia ESBL Klebsiella pneumoniae urinary tract infection Patient with recurrent bacteremia with concern that this is likely related to the bladder mass identified through imaging.  Infectious disease consulted.  Patient is on meropenem.  No definitive treatment per ID given frequent recurrence with requirement for aggressive neurologic intervention needed. -Continue meropenem until goals of care discussions completed  Bladder mass CT chest/abdomen/pelvis on admission significant for evidence of moderate left hydroureteronephrosis in addition to an irregular mass in the left side of urinary bladder. Per chart review, case was discussed with urology who recommended no intervention in the acute infectious phase. -Will re-consult urology depending on ongoing goals of care discussions  Left-sided hydronephrosis Noted on CT  imaging. Moderate in severity and likely related to associated mass in area.  End-stage renal disease on hemodialysis Nephrology was consulted.  Patient was receiving hemodialysis while admitted.  Hemodialysis stopped secondary to transition to comfort measures.  Now, unclear if patient will continue with full comfort measures.  Nephrology continues to follow.  Acute metabolic encephalopathy Possibly related to acute infection.  Appears to have improved.  Also could have been exacerbated secondary to severe hypotension during/after hemodialysis.  Appears mostly resolved.  Diabetes mellitus type 2 Uncontrolled with hyperglycemia.  Last hemoglobin A1c of 9.7%.  Associated renal disease.  Patient has had hypoglycemia this admission.  Patient is on Lantus 5 units daily at bedtime.  Insulin discontinued secondary to transition to comfort measures.  Chronic systolic heart failure Stable.  Subclinical hypothyroidism TSH of 8.5 with free T4 of 0.61.  Patient is not on thyroid medication as an outpatient.  Chronic hypotension Patient is on midodrine which was held secondary to transition to comfort measures.  Currently, blood pressures are stable. -Restart midodrine if patient restarts hemodialysis  Paroxysmal atrial fibrillation with RVR Exacerbated by hypotension with/during hemodialysis.  Rates now better controlled.  Patient is on amiodarone as an outpatient which is currently held secondary to prior transition to comfort measures.  Patient is also on Eliquis 2.5 mg twice daily for stroke prophylaxis which is also held.  RVR improved with IV fluids. -Restart amiodarone and Eliquis pending goals of care discussions  Low back pain Appears to be related to a prior fall about 1 month prior.  Patient with ongoing back pain which is most troubling. -Check lumbar x-ray  CAD Patient is status post history of percutaneous coronary angioplasty.  Troponin mildly elevated at 34 on admission without  associated chest pain.  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  Stable.  Hyperlipidemia Patient is on Lipitor as an outpatient which is held.  Goals of care On 9/24, patient was transition to full comfort measures after discussion per documentation.  Patient is unsure of what she wants regarding her goals of care.  When discussing continuation of hemodialysis, patient states that it is up in the air.  She, at this time, cannot make a decision.  Family at bedside encouraging her to consider continued hemodialysis.  For now, patient agrees to continue medications.  Will consult palliative care medicine for recommendations.    DVT prophylaxis: None Code Status:   Code Status: Limited: Do not attempt resuscitation (DNR) -DNR-LIMITED -Do Not Intubate/DNI  Family Communication: Grandson and sister at bedside Disposition Plan: Discharge pending continued goals of care discussions   Consultants:  Nephrology Infectious disease  Procedures:  Hemodialysis  Antimicrobials: Ceftriaxone Meropenem Vancomycin   Subjective: Patient reports some lower back pain.  She reports having this back pain since having a fall about 1 month prior.  No other concerns.  Objective: BP 106/77   Pulse 70   Temp 97.7 F (36.5 C) (Oral)   Resp 18   Ht 5\' 9"  (1.753 m)   Wt 73.1 kg   SpO2 95%   BMI 23.80 kg/m   Examination:  General exam: Appears calm and comfortable Respiratory system: Clear to auscultation. Respiratory effort normal. Cardiovascular system: S1 & S2 heard Gastrointestinal system: Abdomen is nondistended, soft and nontender. Normal bowel sounds heard. Central nervous system: Alert and oriented. No focal neurological deficits. Musculoskeletal: No calf tenderness Skin: No cyanosis. No rashes Psychiatry: Judgement and insight appear normal. Mood & affect appropriate.    Data Reviewed: I have personally reviewed following labs and imaging studies  CBC Lab Results   Component Value Date   WBC 13.8 (H) 11/24/2022   RBC 3.51 (L) 11/24/2022   HGB 10.3 (L) 11/24/2022   HCT 31.5 (L) 11/24/2022   MCV 89.7 11/24/2022   MCH 29.3 11/24/2022   PLT 222 11/24/2022   MCHC 32.7 11/24/2022   RDW 19.8 (H) 11/24/2022   LYMPHSABS 0.8 11/22/2022   MONOABS 0.7 11/22/2022   EOSABS 0.0 11/22/2022   BASOSABS 0.0 11/22/2022     Last metabolic panel Lab Results  Component Value Date   NA 133 (L) 11/24/2022   K 3.7 11/24/2022   CL 98 11/24/2022   CO2 24 11/24/2022   BUN 18 11/24/2022   CREATININE 1.55 (H) 11/24/2022   GLUCOSE 72 11/24/2022   GFRNONAA 34 (L) 11/24/2022   GFRAA 26 (L) 07/20/2018   CALCIUM 7.4 (L) 11/24/2022   PHOS 1.6 (L) 11/22/2022   PROT 4.7 (L) 11/23/2022   ALBUMIN <1.5 (L) 11/23/2022   LABGLOB 3.7 09/07/2022   AGRATIO 0.4 (L) 09/07/2022   BILITOT 1.0 11/23/2022   ALKPHOS 370 (H) 11/23/2022   AST 58 (H) 11/23/2022   ALT 22 11/23/2022   ANIONGAP 11 11/24/2022    GFR: Estimated Creatinine Clearance: 30.3 mL/min (A) (by C-G formula based on SCr of 1.55 mg/dL (H)).  Recent Results (from the past 240 hour(s))  Culture, blood (Routine x 2)     Status: Abnormal   Collection Time: 11/22/22  2:21 PM   Specimen: BLOOD RIGHT HAND  Result Value Ref Range Status   Specimen Description BLOOD RIGHT HAND  Final   Special Requests   Final    BOTTLES DRAWN AEROBIC AND ANAEROBIC Blood Culture adequate volume   Culture  Setup Time   Final    GRAM  NEGATIVE RODS AEROBIC BOTTLE ONLY CRITICAL RESULT CALLED TO, READ BACK BY AND VERIFIED WITH: PHARMD M. MITCHELL 11/23/22 @ 0559 BY AB Performed at [facility], [address], [place], [place] [address]    Culture (A)  Final    KLEBSIELLA PNEUMONIAE Confirmed Extended Spectrum Beta-Lactamase Producer (ESBL).  In bloodstream infections from ESBL organisms, carbapenems are preferred over piperacillin/tazobactam. They are shown to have a lower risk of mortality.    Report Status 11/25/2022 FINAL   Final   Organism ID, Bacteria KLEBSIELLA PNEUMONIAE  Final      Susceptibility   Klebsiella pneumoniae - MIC*    AMPICILLIN >=32 RESISTANT Resistant     CEFEPIME >=32 RESISTANT Resistant     CEFTAZIDIME 32 RESISTANT Resistant     CEFTRIAXONE >=64 RESISTANT Resistant     CIPROFLOXACIN >=4 RESISTANT Resistant     GENTAMICIN >=16 RESISTANT Resistant     IMIPENEM 1 SENSITIVE Sensitive     TRIMETH/SULFA 160 RESISTANT Resistant     AMPICILLIN/SULBACTAM >=32 RESISTANT Resistant     PIP/TAZO 32 INTERMEDIATE Intermediate     * KLEBSIELLA PNEUMONIAE  Blood Culture ID Panel (Reflexed)     Status: Abnormal   Collection Time: 11/22/22  2:21 PM  Result Value Ref Range Status   Enterococcus faecalis NOT DETECTED NOT DETECTED Final   Enterococcus Faecium NOT DETECTED NOT DETECTED Final   Listeria monocytogenes NOT DETECTED NOT DETECTED Final   Staphylococcus species NOT DETECTED NOT DETECTED Final   Staphylococcus aureus (BCID) NOT DETECTED NOT DETECTED Final   Staphylococcus epidermidis NOT DETECTED NOT DETECTED Final   Staphylococcus lugdunensis NOT DETECTED NOT DETECTED Final   Streptococcus species NOT DETECTED NOT DETECTED Final   Streptococcus agalactiae NOT DETECTED NOT DETECTED Final   Streptococcus pneumoniae NOT DETECTED NOT DETECTED Final   Streptococcus pyogenes NOT DETECTED NOT DETECTED Final   A.calcoaceticus-baumannii NOT DETECTED NOT DETECTED Final   Bacteroides fragilis NOT DETECTED NOT DETECTED Final   Enterobacterales DETECTED (A) NOT DETECTED Final    Comment: Enterobacterales represent a large order of gram negative bacteria, not a single organism. CRITICAL RESULT CALLED TO, READ BACK BY AND VERIFIED WITH: PHARMD M. MITCHELL 11/23/22 @ 0559 BY AB    Enterobacter cloacae complex NOT DETECTED NOT DETECTED Final   Escherichia coli NOT DETECTED NOT DETECTED Final   Klebsiella aerogenes NOT DETECTED NOT DETECTED Final   Klebsiella oxytoca NOT DETECTED NOT DETECTED Final    Klebsiella pneumoniae DETECTED (A) NOT DETECTED Final    Comment: CRITICAL RESULT CALLED TO, READ BACK BY AND VERIFIED WITH: PHARMD M. MITCHELL 11/23/22 @ 0559 BY AB    Proteus species NOT DETECTED NOT DETECTED Final   Salmonella species NOT DETECTED NOT DETECTED Final   Serratia marcescens NOT DETECTED NOT DETECTED Final   Haemophilus influenzae NOT DETECTED NOT DETECTED Final   Neisseria meningitidis NOT DETECTED NOT DETECTED Final   Pseudomonas aeruginosa NOT DETECTED NOT DETECTED Final   Stenotrophomonas maltophilia NOT DETECTED NOT DETECTED Final   Candida albicans NOT DETECTED NOT DETECTED Final   Candida auris NOT DETECTED NOT DETECTED Final   Candida glabrata NOT DETECTED NOT DETECTED Final   Candida krusei NOT DETECTED NOT DETECTED Final   Candida parapsilosis NOT DETECTED NOT DETECTED Final   Candida tropicalis NOT DETECTED NOT DETECTED Final   Cryptococcus neoformans/gattii NOT DETECTED NOT DETECTED Final   CTX-M ESBL DETECTED (A) NOT DETECTED Final    Comment: CRITICAL RESULT CALLED TO, READ BACK BY AND VERIFIED WITH: PHARMD  M. MITCHELL 11/23/22 @ 0559 BY AB (NOTE) Extended spectrum beta-lactamase detected. Recommend a carbapenem as initial therapy.      Carbapenem resistance IMP NOT DETECTED NOT DETECTED Final   Carbapenem resistance KPC NOT DETECTED NOT DETECTED Final   Carbapenem resistance NDM NOT DETECTED NOT DETECTED Final   Carbapenem resist OXA 48 LIKE NOT DETECTED NOT DETECTED Final   Carbapenem resistance VIM NOT DETECTED NOT DETECTED Final    Comment: Performed at [facility], [address], [place], [place] [address]  Culture, blood (Routine x 2)     Status: Abnormal   Collection Time: 11/22/22  2:26 PM   Specimen: BLOOD  Result Value Ref Range Status   Specimen Description BLOOD RIGHT ANTECUBITAL  Final   Special Requests NONE  Final   Culture  Setup Time   Final    GRAM NEGATIVE RODS AEROBIC BOTTLE ONLY CRITICAL VALUE NOTED.  VALUE IS  CONSISTENT WITH PREVIOUSLY REPORTED AND CALLED VALUE.    Culture (A)  Final    KLEBSIELLA PNEUMONIAE SUSCEPTIBILITIES PERFORMED ON PREVIOUS CULTURE WITHIN THE LAST 5 DAYS. Performed at [facility], [address], [place], [place] [address]    Report Status 11/25/2022 FINAL  Final  SARS Coronavirus 2 by RT PCR (hospital order, performed in [facility] hospital lab) *cepheid single result test* Anterior Nasal Swab     Status: Abnormal   Collection Time: 11/22/22  3:33 PM   Specimen: Anterior Nasal Swab  Result Value Ref Range Status   SARS Coronavirus 2 by RT PCR POSITIVE (A) NEGATIVE Final    Comment: Performed at [facility], [address] [place], [place] [address]  Urine Culture     Status: Abnormal   Collection Time: 11/22/22  4:21 PM   Specimen: Urine, Random  Result Value Ref Range Status   Specimen Description URINE, RANDOM  Final   Special Requests   Final    NONE Reflexed from [id number] Performed at [facility], [address] [place], [place] [address]    Culture (A)  Final    >=100,000 COLONIES/mL KLEBSIELLA PNEUMONIAE 80,000 COLONIES/mL KLEBSIELLA PNEUMONIAE Confirmed Extended Spectrum Beta-Lactamase Producer (ESBL).  In bloodstream infections from ESBL organisms, carbapenems are preferred over piperacillin/tazobactam. They are shown to have a lower risk of mortality.    Report Status 11/25/2022 FINAL  Final   Organism ID, Bacteria KLEBSIELLA PNEUMONIAE (A)  Final   Organism ID, Bacteria KLEBSIELLA PNEUMONIAE (A)  Final      Susceptibility   Klebsiella pneumoniae - MIC*    AMPICILLIN >=32 RESISTANT Resistant     CEFAZOLIN >=64 RESISTANT Resistant     CEFEPIME >=32 RESISTANT Resistant     CEFTRIAXONE >=64 RESISTANT Resistant     CIPROFLOXACIN >=4 RESISTANT Resistant     GENTAMICIN >=16 RESISTANT Resistant     IMIPENEM <=0.25 SENSITIVE Sensitive     NITROFURANTOIN 128 RESISTANT Resistant     TRIMETH/SULFA 80 RESISTANT Resistant      AMPICILLIN/SULBACTAM >=32 RESISTANT Resistant     PIP/TAZO 16 SENSITIVE Sensitive    Klebsiella pneumoniae - MIC*    AMPICILLIN >=32 RESISTANT Resistant     CEFAZOLIN >=64 RESISTANT Resistant     CEFEPIME >=32 RESISTANT Resistant     CEFTRIAXONE >=64 RESISTANT Resistant     CIPROFLOXACIN >=4 RESISTANT Resistant     GENTAMICIN <=1 SENSITIVE Sensitive     IMIPENEM 1 SENSITIVE Sensitive     NITROFURANTOIN 64 INTERMEDIATE Intermediate  TRIMETH/SULFA <=20 SENSITIVE Sensitive     AMPICILLIN/SULBACTAM >=32 RESISTANT Resistant     PIP/TAZO <=4 SENSITIVE Sensitive     * >=100,000 COLONIES/mL KLEBSIELLA PNEUMONIAE    80,000 COLONIES/mL KLEBSIELLA PNEUMONIAE      Radiology Studies: No results found.    LOS: 2 days    Jacquelin Hawking, MD [facility] 11/25/2022, 11:01 AM   If 7PM-7AM, please contact night-coverage [email or web address]

## 2022-11-25 NOTE — Progress Notes (Signed)
[facility] NEPHROLOGY PROGRESS NOTE  Assessment/ Plan: Pt is a 82 y.o. yo female   OP HD: SW MWF  3.5h   400/600   67kg   3K/2.5 bath  TDC  Heparin none - last OP HD 9/20, post wt 68.4kg - dry wt lowered 74 --> 67kg in last 3 wks - mircera 100 mcg IV q 2 wks, last 9/16, due 9/30  # Acute hypoxic resp failure/ pulmonary edema:  UF with HD, currently on around 2 to 3 L of oxygen.  # Covid infection - remains + from Aug 2024 represents recovered COVID illness from last month, seen by ID no need for isolation.   #ESRD - on HD MWF.  She had intradialytic hypotension on 9/23 therefore no UF.  We initially planned for dialysis today however it was discontinued by primary team because of comfort care. The patient is more awake and alert today.  The grandson and patient's sister would like to talk to the primary team as well as palliative care before pursuing dialysis.  I will hold off dialysis today until I hear back from the team.  Discussed with Dr.Nettey as well.  #Chronic hypotension -she was on midodrine which was discontinued on 9/24 by primary team.  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    # Severe sepsis due to recurrent ESBL bacteremia, recurrent UTI/bladder tumor: Per urology, no biopsy while treating infection, seems like plan to follow outpatient by urology.  # DNR/GOC: Patient was lethargic and somnolent yesterday when she was made comfort care.  She is more alert awake today.  The patient's grandson and sister would like to talk to palliative care team as well as with primary medical team to discuss further.  I think, hospice care is appropriate as she was clearly not tolerating dialysis well.  I have discussed with the family members at length today. Also discussed with dialysis nurse, floor nurse and the primary team.  Subjective: Seen  and examined.  Event noted.  She was made comfort care yesterday when dialysis orders were removed.  She is more alert awake today.  No nausea, vomiting, chest pain or shortness of breath. Objective Vital signs in last 24 hours: Vitals:   11/24/22 0600 11/24/22 0615 11/24/22 0630 11/24/22 2000  BP: (!) 80/48 (!) 83/47 (!) 100/59 106/77  Pulse: 90 87 (!) 118 70  Resp: 15 15 14 18   Temp:   98.4 F (36.9 C) 97.7 F (36.5 C)  TempSrc:   Oral Oral  SpO2: 93% 93% 94% 95%  Weight:      Height:       Weight change:   Intake/Output Summary (Last 24 hours) at 11/25/2022 1103 Last data filed at 11/25/2022 0900 Gross per 24 hour  Intake 920 ml  Output --  Net 920 ml       Labs: RENAL PANEL Recent Labs  Lab 11/22/22 1520 11/22/22 1946 11/23/22 0306 11/24/22 0803  NA 132*  --  132* 133*  K 5.4*  --  4.6 3.7  CL 93*  --  95* 98  CO2 27  --  27 24  GLUCOSE 166*  --  141* 72  BUN 32*  --  37* 18  CREATININE 2.51*  --  2.79* 1.55*  CALCIUM 7.5*  --  7.6* 7.4*  PHOS  --  1.6*  --   --  ALBUMIN <1.5*  --  <1.5*  --     Liver Function Tests: Recent Labs  Lab 11/22/22 1520 11/23/22 0306  AST 62* 58*  ALT 19 22  ALKPHOS 394* 370*  BILITOT 1.3* 1.0  PROT 5.0* 4.7*  ALBUMIN <1.5* <1.5*   Recent Labs  Lab 11/22/22 1520  LIPASE 19   No results for input(s): "AMMONIA" in the last 168 hours. CBC: Recent Labs    08/09/22 1126 08/10/22 0105 09/07/22 0644 09/08/22 0244 09/09/22 0307 10/09/22 0223 10/09/22 0224 10/09/22 0555 10/09/22 1757 10/28/22 0355 10/30/22 0720 11/22/22 1520 11/22/22 1945 11/23/22 0306 11/24/22 0803  HGB  --    < > 7.1* 8.1*   < >  --    < >  --    < > 8.7* 10.9* 9.2*  --  9.2* 10.3*  MCV  --    < > 85.7 83.7   < >  --    < >  --    < > 92.7 89.7 90.4  --  91.1 89.7  VITAMINB12 1,892*  --  1,280*  --   --   --   --  744  --   --   --   --  4,144*  --   --   FOLATE 19.6  --  11.6  --   --   --   --  12.0  --   --   --   --   --   --   --    FERRITIN 177  --   --  136  --   --   --  269  --   --   --   --   --   --   --   TIBC 213*  --   --  157*  --   --   --  119*  --   --   --   --   --   --   --   IRON 33  --   --  20*  --   --   --  17*  --   --   --   --   --   --   --   RETICCTPCT 2.4  --   --   --   --  3.8*  --   --   --   --   --   --   --   --   --    < > = values in this interval not displayed.    Cardiac Enzymes: Recent Labs  Lab 11/22/22 1520  CKTOTAL 29*   CBG: Recent Labs  Lab 11/23/22 1212 11/23/22 1607 11/23/22 2047 11/24/22 0610 11/25/22 0905  GLUCAP 83 121* 141* 92 58*    Iron Studies: No results for input(s): "IRON", "TIBC", "TRANSFERRIN", "FERRITIN" in the last 72 hours. Studies/Results: No results found.  Medications: Infusions:  sodium chloride      Scheduled Medications:  Chlorhexidine Gluconate Cloth  6 each Topical Q0600   Chlorhexidine Gluconate Cloth  6 each Topical Q0600   polyethylene glycol  17 g Oral Daily   sodium chloride flush  3 mL Intravenous Q12H    have reviewed scheduled and prn medications.  Physical Exam: General: More alert awake and following commands. Heart:RRR, s1s2 nl Lungs: Bibasal rhonchi, no increased work of breathing Abdomen:soft, Non-tender, non-distended Extremities:No edema Dialysis Access: Right IJ TDC in place.  [name] [name] [name] [name] 11/25/2022,11:03 AM  LOS: 2  days

## 2022-11-25 NOTE — Progress Notes (Signed)
Pharmacy Antibiotic Note  [name] [name] is a 82 y.o. female admitted on 11/22/2022 with Kleb pneumonia ESBL in urine and blood. Pharmacy has been consulted for meropenem dosing. Pt received this from for a few days previously and then it was stopped by ID with plans for comfort approach, now plans TBD. Pt is ESRD on HD, last HD 9/24.  Plan: Meropenem 500g IV q24h  Height: 5\' 9"  (175.3 cm) Weight: 73.1 kg (161 lb 2.5 oz) IBW/kg (Calculated) : 66.2  Temp (24hrs), Avg:97.7 F (36.5 C), Min:97.7 F (36.5 C), Max:97.7 F (36.5 C)  Recent Labs  Lab 11/22/22 1520 11/22/22 1534 11/22/22 1945 11/22/22 2237 11/23/22 0306 11/24/22 0803  WBC 16.3*  --   --   --  20.2* 13.8*  CREATININE 2.51*  --   --   --  2.79* 1.55*  LATICACIDVEN  --  2.2* 1.6 1.7  --   --     Estimated Creatinine Clearance: 30.3 mL/min (A) (by C-G formula based on SCr of 1.55 mg/dL (H)).    Allergies  Allergen Reactions   Procaine Hcl Anaphylaxis   Amoxicillin Itching    Antimicrobials this admission: Ceftriaxone 9/22 x1 Meropenem 9/22 >> 9/23; 9/25 >>   Microbiology results: 9/22 BCx: Klebsiella pneumoniae (ESBL) 9/22 UCx: Klebsiella pneumoniae (ESBL)    Fredonia Highland, PharmD, BCPS, [facility] Clinical Pharmacist [phone] Please check AMION for all [facility] Pharmacy numbers 11/25/2022

## 2022-11-25 NOTE — Progress Notes (Signed)
Pt's responsiveness to nursing staff appropriate compared to night prior. Grandson remained at bedside overnight & may be the reason pt is more cooperative. Pt continues to keep eyes closed while answering questions. While this RN was completing peri & oral care, grandson asked pt if she would eat for him this morning and she said yes. Chronic back and hip pain noted during turns. Moans & is restless. Relieved with prn iv pain med. [name], grandson, is awaiting provider to round this a.m. to discuss further plan of care.

## 2022-11-25 NOTE — Plan of Care (Signed)
  Problem: Respiratory: Goal: Will maintain a patent airway Outcome: Progressing Goal: Complications related to the disease process, condition or treatment will be avoided or minimized Outcome: Progressing   Problem: Education: Goal: Knowledge of risk factors and measures for prevention of condition will improve Outcome: Not Progressing   Problem: Education: Goal: Knowledge of disease or condition will improve Outcome: Not Progressing   Problem: Nutrition: Goal: Dietary intake will improve Outcome: Not Progressing

## 2022-11-25 NOTE — Progress Notes (Signed)
   11/25/22 1526  Spiritual Encounters  Type of Visit Follow up  Care provided to: Patient  Conversation partners present during encounter Nurse  Reason for visit Routine spiritual support  OnCall Visit No   Chaplain stopped in to check if Fr Ree Kida had come by last night to provide care to Pt.  He did come and provide anointing of the sick.

## 2022-11-26 DIAGNOSIS — Z515 Encounter for palliative care: Secondary | ICD-10-CM | POA: Diagnosis not present

## 2022-11-26 DIAGNOSIS — N390 Urinary tract infection, site not specified: Secondary | ICD-10-CM | POA: Diagnosis not present

## 2022-11-26 DIAGNOSIS — Z7189 Other specified counseling: Secondary | ICD-10-CM | POA: Diagnosis not present

## 2022-11-26 DIAGNOSIS — R6520 Severe sepsis without septic shock: Secondary | ICD-10-CM | POA: Diagnosis not present

## 2022-11-26 DIAGNOSIS — A419 Sepsis, unspecified organism: Secondary | ICD-10-CM | POA: Diagnosis not present

## 2022-11-26 LAB — GLUCOSE, CAPILLARY
Glucose-Capillary: 129 mg/dL — ABNORMAL HIGH (ref 70–99)
Glucose-Capillary: 148 mg/dL — ABNORMAL HIGH (ref 70–99)
Glucose-Capillary: 207 mg/dL — ABNORMAL HIGH (ref 70–99)
Glucose-Capillary: 234 mg/dL — ABNORMAL HIGH (ref 70–99)

## 2022-11-26 MED ORDER — FENTANYL CITRATE PF 50 MCG/ML IJ SOSY
25.0000 ug | PREFILLED_SYRINGE | INTRAMUSCULAR | Status: DC | PRN
  Administered 2022-11-26: 25 ug via INTRAVENOUS
  Filled 2022-11-26: qty 1

## 2022-11-26 MED ORDER — OXYCODONE HCL 5 MG PO TABS
5.0000 mg | ORAL_TABLET | ORAL | Status: DC | PRN
  Administered 2022-11-26: 5 mg via ORAL
  Filled 2022-11-26: qty 1

## 2022-11-26 NOTE — Consult Note (Signed)
Consultation Note Date: 11/26/2022   Patient Name: [name] [name]  DOB: 29-Oct-1940  MRN: [id number]  Age / Sex: 82 y.o., female  PCP: [name], MD Referring Physician: Narda Bonds, MD  Reason for Consultation: Establishing goals of care  HPI/Patient Profile: 82 y.o. female  with past medical history of ESRD on dialysis MWF, atrial fibrillation on eliquis, T2DM, GERD, hypotension on midodrine, HLD, recurrent UTIs, seizures, systolic CHF, CVA, admitted on 11/22/2022 with dysuria, fatigue and back pain.   Patient has had 7 hospitalizations in the past 6 months.  Most recent hospitalization 8/26 to 8/30 for AMS secondary to hypoglycemia and covid infection. She was also hospitalized 8/7 to 8/17 during which she was found to have ESBL bacteremia, EF of 25%.    She is currently admitted for severe sepsis due to recurrent ESBL bacteremia and UTI, acute hypoxic respiratory failure with pulmonary edema, AKI on CKD 4 requiring dialysis. Patient has expressed interest in comfort focused care during this admission, though there was uncertainty about whether to discontinue dialysis or not.  PMT has been consulted to assist with goals of care conversation.  Clinical Assessment and Goals of Care:  I have reviewed medical records including EPIC notes, labs and imaging, assessed the patient and then met at the bedside with patient's sister and grandson to discuss diagnosis prognosis, GOC, EOL wishes, disposition and options.  I introduced Palliative Medicine as specialized medical care for people living with serious illness. It focuses on providing relief from the symptoms and stress of a serious illness. The goal is to improve quality of life for both the patient and the family.  We discussed a brief life review of the patient and then focused on their current illness.   I attempted to elicit values and goals of care  important to the patient.    Medical History Review and Understanding:  Patient and her family have a good understanding of the severity of patient's illness and expected prognosis without HD.  Social History: Patient has been married for 58 years, however her husband is currently eligible.  She is well supported by her family, especially her sister and grandson.  Palliative Symptoms: Back and hip pain, currently a 4/10  Advance Directives: A detailed discussion regarding advanced directives was had.  Patient's son and sister report they are primary and secondary HCPOA respectively.  Patient is currently on file.  Discussion: Patient continues to state that she no longer wants HD and her sister shares that family is in agreement and supportive of her wishes, despite how hard this is to accept. They want to prioritize her comfort and recognize that it is ultimately her choice. Her grandson arrived shortly afterwards from visiting with patient's husband and confirms he feels the same way. They are glad that she seems to be doing much better after stopping HD and reflect on how difficult must have been for her body to go through that.  We discussed prognosis of weeks at best and possible options for disposition. [name] [name] is very clear that she should not return to her facility, as they would not let him stay overnight for support. After speaking with patient's husband, he is also concerned about her returning home with hospice given the previous trauma of husband finding her son dead in the same home. I counseled on the option of referral to a hospice facility near their home and they would like to pursue this. Patient states that she wants to discuss this further with  her husband when he visits in the afternoon to hear his concerns for himself. Grandson would like to know if patient can continue with IV meropenem at the hospice facility. Counseled on the unlikelihood of this given that it does not  provide comfort and ID has advised there is likely no definitive treatment or benefit from continuing. He verbalized his understanding and would still be interested in a hospice facility even if they are unable to administer IV meropenem. We discussed symptom management strategies and I requested RN to bring PRN fentanyl for patient. Patient tells me this is currently working well. [name] [name] is still interested in trying PRN oxycodone as well.    The difference between aggressive medical intervention and comfort care was considered in light of the patient's goals of care. Hospice services outpatient were explained and offered.   Discussed the importance of continued conversation with family and the medical providers regarding overall plan of care and treatment options, ensuring decisions are within the context of the patient's values and GOCs.   Questions and concerns were addressed. The family was encouraged to call with questions or concerns.  PMT will continue to support holistically.   SUMMARY OF RECOMMENDATIONS   -Continue DNR/DNI -Continue comfort focused care, no adjustments required today -Patient and family would like to continue IV meropenem while admitted -Goal is for residential hospice placement, [facility] in [facility] preferred. Appreciate TOC assistance  -Await bed availability at hospice facility -Psychosocial and emotional support provided -PMT will continue to follow and support  Prognosis:  < 2 weeks  Discharge Planning: Hospice facility      Primary Diagnoses: Present on Admission:  Abnormal results of thyroid function studies  Anemia due to chronic kidney disease  Chronic hypotension  GERD (gastroesophageal reflux disease)  Paroxysmal atrial fibrillation (HCC)  Hyperlipidemia  Severe sepsis due to recurrent ESBL bacteremia  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), stage IV requiring dialysis [facility])   Physical Exam Vitals and nursing note reviewed.  Constitutional:       General: She is not in acute distress.    Appearance: She is ill-appearing.  Cardiovascular:     Rate and Rhythm: Normal rate.  Pulmonary:     Effort: Pulmonary effort is normal.  Neurological:     Mental Status: She is alert.  Psychiatric:        Mood and Affect: Mood normal.        Behavior: Behavior normal.    Vital Signs: BP (!) 115/47 (BP Location: Left Arm)   Pulse 76   Temp 97.6 F (36.4 C) (Axillary)   Resp 14   Ht 5\' 9"  (1.753 m)   Wt 73.1 kg   SpO2 99%   BMI 23.80 kg/m  Pain Scale: PAINAD   Pain Score: Asleep   SpO2: SpO2: 99 % O2 Device:SpO2: 99 % O2 Flow Rate: .O2 Flow Rate (L/min): 2 L/min   Palliative Assessment/Data:     MDM: high   Lashane Whelpley Jeni Salles, PA-C  Palliative Medicine Team Team phone # [phone]  Thank you for allowing the Palliative Medicine Team to assist in the care of this patient. Please utilize secure chat with additional questions, if there is no response within 30 minutes please call the above phone number.  Palliative Medicine Team providers are available by phone from 7am to 7pm daily and can be reached through the team cell phone.  Should this patient require assistance outside of these hours, please call the patient's attending physician.

## 2022-11-26 NOTE — Plan of Care (Signed)
Problem: Elimination: Goal: Will not experience complications related to urinary retention Outcome: Completed/Met

## 2022-11-26 NOTE — Progress Notes (Signed)
PROGRESS NOTE    [name] [name]  UVO:[id number] DOB: 22-Jun-1940 DOA: 11/22/2022 PCP: [name], MD   Brief Narrative: [name] [name] is a 82 y.o. 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  Patient presented secondary to severe myalgias, dysuria, fatigue and was found to have evidence of sepsis.  Workup revealed evidence of ESBL Klebsiella pneumonia bacteremia in addition to UTI infectious ease was consulted.  Appropriate antibiotics prescribed.  During hospitalization, patient was transitioned to comfort measures secondary to poor toleration of hemodialysis.  Goals of care unclear at this time as patient is not definitive if she wants comfort measures at this time.    Assessment and Plan:  Severe sepsis Present on admission.  Secondary to ESBL Klebsiella pneumonia bacteremia from urinary source.  Patient managed on meropenem for treatment.  ESBL Klebsiella pneumoniae bacteremia ESBL Klebsiella pneumoniae urinary tract infection Patient with recurrent bacteremia with concern that this is likely related to the bladder mass identified through imaging.  Infectious disease consulted.  Patient is on meropenem.  No definitive treatment per ID given frequent recurrence with requirement for aggressive neurologic intervention needed. -Continue meropenem until goals of care discussions completed  Bladder mass CT chest/abdomen/pelvis on admission significant for evidence of moderate left hydroureteronephrosis in addition to an irregular mass in the left side of urinary bladder. Per chart review, case was discussed with urology who recommended no intervention in the acute infectious phase. -Will re-consult urology depending on ongoing goals of care discussions  Left-sided hydronephrosis Noted on CT  imaging. Moderate in severity and likely related to associated mass in area.  End-stage renal disease on hemodialysis Nephrology was consulted.  Patient was receiving hemodialysis while admitted.  Hemodialysis stopped secondary to transition to comfort measures.  Now, unclear if patient will continue with full comfort measures.  Nephrology continues to follow.  Acute metabolic encephalopathy Possibly related to acute infection.  Appears to have improved.  Also could have been exacerbated secondary to severe hypotension during/after hemodialysis.  Appears mostly resolved.  Diabetes mellitus type 2 Uncontrolled with hyperglycemia.  Last hemoglobin A1c of 9.7%.  Associated renal disease.  Patient has had hypoglycemia this admission.  Patient is on Lantus 5 units daily at bedtime.  Insulin discontinued secondary to transition to comfort measures.  Chronic systolic heart failure Stable.  Subclinical hypothyroidism TSH of 8.5 with free T4 of 0.61.  Patient is not on thyroid medication as an outpatient.  Chronic hypotension Patient is on midodrine which was held secondary to transition to comfort measures.  Currently, blood pressures are stable. -Restart midodrine if patient restarts hemodialysis  Paroxysmal atrial fibrillation with RVR Exacerbated by hypotension with/during hemodialysis.  Rates now better controlled.  Patient is on amiodarone as an outpatient which is currently held secondary to prior transition to comfort measures.  Patient is also on Eliquis 2.5 mg twice daily for stroke prophylaxis which is also held.  RVR improved with IV fluids. -Restart amiodarone and Eliquis pending goals of care discussions  L1 compression fracture Noted on x-ray. Likely related to recent fall from one month prior. -Oxycodone PRN  CAD Patient is status post history of percutaneous coronary angioplasty.  Troponin mildly elevated at 34 on admission without associated chest pain.  Anemia of chronic  disease Secondary to kidney disease.  Stable.  Hyperlipidemia Patient is on Lipitor as an outpatient  which is held.  Goals of care On 9/24, patient was transition to full comfort measures after discussion per documentation.  Patient is unsure of what she wants regarding her goals of care.  When discussing continuation of hemodialysis, patient states that it is up in the air.  She, at this time, cannot make a decision.  Family at bedside encouraging her to consider continued hemodialysis.  For now, patient agrees to continue medications. -Palliative care recommendations    DVT prophylaxis: None Code Status:   Code Status: Limited: Do not attempt resuscitation (DNR) -DNR-LIMITED -Do Not Intubate/DNI  Family Communication: Grandson at bedside Disposition Plan: Discharge pending continued goals of care discussions. Anticipate discharge to hospice facility vs home with hospice   Consultants:  Nephrology Infectious disease Palliative care medicine  Procedures:  Hemodialysis  Antimicrobials: Ceftriaxone Meropenem Vancomycin   Subjective: Patient reports some lower back pain.  She reports having this back pain since having a fall about 1 month prior.  No other concerns.  Objective: BP (!) 115/47 (BP Location: Left Arm)   Pulse 76   Temp 97.6 F (36.4 C) (Axillary)   Resp 14   Ht 5\' 9"  (1.753 m)   Wt 73.1 kg   SpO2 99%   BMI 23.80 kg/m   Examination:  General exam: Appears calm and comfortable   Data Reviewed: I have personally reviewed following labs and imaging studies  CBC Lab Results  Component Value Date   WBC 13.8 (H) 11/24/2022   RBC 3.51 (L) 11/24/2022   HGB 10.3 (L) 11/24/2022   HCT 31.5 (L) 11/24/2022   MCV 89.7 11/24/2022   MCH 29.3 11/24/2022   PLT 222 11/24/2022   MCHC 32.7 11/24/2022   RDW 19.8 (H) 11/24/2022   LYMPHSABS 0.8 11/22/2022   MONOABS 0.7 11/22/2022   EOSABS 0.0 11/22/2022   BASOSABS 0.0 11/22/2022     Last metabolic panel Lab  Results  Component Value Date   NA 133 (L) 11/24/2022   K 3.7 11/24/2022   CL 98 11/24/2022   CO2 24 11/24/2022   BUN 18 11/24/2022   CREATININE 1.55 (H) 11/24/2022   GLUCOSE 72 11/24/2022   GFRNONAA 34 (L) 11/24/2022   GFRAA 26 (L) 07/20/2018   CALCIUM 7.4 (L) 11/24/2022   PHOS 1.6 (L) 11/22/2022   PROT 4.7 (L) 11/23/2022   ALBUMIN <1.5 (L) 11/23/2022   LABGLOB 3.7 09/07/2022   AGRATIO 0.4 (L) 09/07/2022   BILITOT 1.0 11/23/2022   ALKPHOS 370 (H) 11/23/2022   AST 58 (H) 11/23/2022   ALT 22 11/23/2022   ANIONGAP 11 11/24/2022    GFR: Estimated Creatinine Clearance: 30.3 mL/min (A) (by C-G formula based on SCr of 1.55 mg/dL (H)).  Recent Results (from the past 240 hour(s))  Culture, blood (Routine x 2)     Status: Abnormal   Collection Time: 11/22/22  2:21 PM   Specimen: BLOOD RIGHT HAND  Result Value Ref Range Status   Specimen Description BLOOD RIGHT HAND  Final   Special Requests   Final    BOTTLES DRAWN AEROBIC AND ANAEROBIC Blood Culture adequate volume   Culture  Setup Time   Final    GRAM NEGATIVE RODS AEROBIC BOTTLE ONLY CRITICAL RESULT CALLED TO, READ BACK BY AND VERIFIED WITH: PHARMD M. MITCHELL 11/23/22 @ 0559 BY AB Performed at [facility], [address], [place], [place] [address]    Culture (A)  Final    KLEBSIELLA PNEUMONIAE Confirmed Extended Spectrum Beta-Lactamase Producer (ESBL).  In  bloodstream infections from ESBL organisms, carbapenems are preferred over piperacillin/tazobactam. They are shown to have a lower risk of mortality.    Report Status 11/25/2022 FINAL  Final   Organism ID, Bacteria KLEBSIELLA PNEUMONIAE  Final      Susceptibility   Klebsiella pneumoniae - MIC*    AMPICILLIN >=32 RESISTANT Resistant     CEFEPIME >=32 RESISTANT Resistant     CEFTAZIDIME 32 RESISTANT Resistant     CEFTRIAXONE >=64 RESISTANT Resistant     CIPROFLOXACIN >=4 RESISTANT Resistant     GENTAMICIN >=16 RESISTANT Resistant     IMIPENEM 1 SENSITIVE  Sensitive     TRIMETH/SULFA 160 RESISTANT Resistant     AMPICILLIN/SULBACTAM >=32 RESISTANT Resistant     PIP/TAZO 32 INTERMEDIATE Intermediate     * KLEBSIELLA PNEUMONIAE  Blood Culture ID Panel (Reflexed)     Status: Abnormal   Collection Time: 11/22/22  2:21 PM  Result Value Ref Range Status   Enterococcus faecalis NOT DETECTED NOT DETECTED Final   Enterococcus Faecium NOT DETECTED NOT DETECTED Final   Listeria monocytogenes NOT DETECTED NOT DETECTED Final   Staphylococcus species NOT DETECTED NOT DETECTED Final   Staphylococcus aureus (BCID) NOT DETECTED NOT DETECTED Final   Staphylococcus epidermidis NOT DETECTED NOT DETECTED Final   Staphylococcus lugdunensis NOT DETECTED NOT DETECTED Final   Streptococcus species NOT DETECTED NOT DETECTED Final   Streptococcus agalactiae NOT DETECTED NOT DETECTED Final   Streptococcus pneumoniae NOT DETECTED NOT DETECTED Final   Streptococcus pyogenes NOT DETECTED NOT DETECTED Final   A.calcoaceticus-baumannii NOT DETECTED NOT DETECTED Final   Bacteroides fragilis NOT DETECTED NOT DETECTED Final   Enterobacterales DETECTED (A) NOT DETECTED Final    Comment: Enterobacterales represent a large order of gram negative bacteria, not a single organism. CRITICAL RESULT CALLED TO, READ BACK BY AND VERIFIED WITH: PHARMD M. MITCHELL 11/23/22 @ 0559 BY AB    Enterobacter cloacae complex NOT DETECTED NOT DETECTED Final   Escherichia coli NOT DETECTED NOT DETECTED Final   Klebsiella aerogenes NOT DETECTED NOT DETECTED Final   Klebsiella oxytoca NOT DETECTED NOT DETECTED Final   Klebsiella pneumoniae DETECTED (A) NOT DETECTED Final    Comment: CRITICAL RESULT CALLED TO, READ BACK BY AND VERIFIED WITH: PHARMD M. MITCHELL 11/23/22 @ 0559 BY AB    Proteus species NOT DETECTED NOT DETECTED Final   Salmonella species NOT DETECTED NOT DETECTED Final   Serratia marcescens NOT DETECTED NOT DETECTED Final   Haemophilus influenzae NOT DETECTED NOT DETECTED Final    Neisseria meningitidis NOT DETECTED NOT DETECTED Final   Pseudomonas aeruginosa NOT DETECTED NOT DETECTED Final   Stenotrophomonas maltophilia NOT DETECTED NOT DETECTED Final   Candida albicans NOT DETECTED NOT DETECTED Final   Candida auris NOT DETECTED NOT DETECTED Final   Candida glabrata NOT DETECTED NOT DETECTED Final   Candida krusei NOT DETECTED NOT DETECTED Final   Candida parapsilosis NOT DETECTED NOT DETECTED Final   Candida tropicalis NOT DETECTED NOT DETECTED Final   Cryptococcus neoformans/gattii NOT DETECTED NOT DETECTED Final   CTX-M ESBL DETECTED (A) NOT DETECTED Final    Comment: CRITICAL RESULT CALLED TO, READ BACK BY AND VERIFIED WITH: PHARMD M. MITCHELL 11/23/22 @ 0559 BY AB (NOTE) Extended spectrum beta-lactamase detected. Recommend a carbapenem as initial therapy.      Carbapenem resistance IMP NOT DETECTED NOT DETECTED Final   Carbapenem resistance KPC NOT DETECTED NOT DETECTED Final   Carbapenem resistance NDM NOT DETECTED NOT DETECTED Final   Carbapenem resist OXA  48 LIKE NOT DETECTED NOT DETECTED Final   Carbapenem resistance VIM NOT DETECTED NOT DETECTED Final    Comment: Performed at [facility], [address], [place], [place] [address]  Culture, blood (Routine x 2)     Status: Abnormal   Collection Time: 11/22/22  2:26 PM   Specimen: BLOOD  Result Value Ref Range Status   Specimen Description BLOOD RIGHT ANTECUBITAL  Final   Special Requests NONE  Final   Culture  Setup Time   Final    GRAM NEGATIVE RODS AEROBIC BOTTLE ONLY CRITICAL VALUE NOTED.  VALUE IS CONSISTENT WITH PREVIOUSLY REPORTED AND CALLED VALUE.    Culture (A)  Final    KLEBSIELLA PNEUMONIAE SUSCEPTIBILITIES PERFORMED ON PREVIOUS CULTURE WITHIN THE LAST 5 DAYS. Performed at [facility], [address], [place], [place] [address]    Report Status 11/25/2022 FINAL  Final  SARS Coronavirus 2 by RT PCR (hospital order, performed in [facility] hospital lab) *cepheid single  result test* Anterior Nasal Swab     Status: Abnormal   Collection Time: 11/22/22  3:33 PM   Specimen: Anterior Nasal Swab  Result Value Ref Range Status   SARS Coronavirus 2 by RT PCR POSITIVE (A) NEGATIVE Final    Comment: Performed at [facility], [address], [place], [place] [address]  Urine Culture     Status: Abnormal   Collection Time: 11/22/22  4:21 PM   Specimen: Urine, Random  Result Value Ref Range Status   Specimen Description URINE, RANDOM  Final   Special Requests   Final    NONE Reflexed from [id number] Performed at [facility], [address] [place], [place] [address]    Culture (A)  Final    >=100,000 COLONIES/mL KLEBSIELLA PNEUMONIAE 80,000 COLONIES/mL KLEBSIELLA PNEUMONIAE Confirmed Extended Spectrum Beta-Lactamase Producer (ESBL).  In bloodstream infections from ESBL organisms, carbapenems are preferred over piperacillin/tazobactam. They are shown to have a lower risk of mortality.    Report Status 11/25/2022 FINAL  Final   Organism ID, Bacteria KLEBSIELLA PNEUMONIAE (A)  Final   Organism ID, Bacteria KLEBSIELLA PNEUMONIAE (A)  Final      Susceptibility   Klebsiella pneumoniae - MIC*    AMPICILLIN >=32 RESISTANT Resistant     CEFAZOLIN >=64 RESISTANT Resistant     CEFEPIME >=32 RESISTANT Resistant     CEFTRIAXONE >=64 RESISTANT Resistant     CIPROFLOXACIN >=4 RESISTANT Resistant     GENTAMICIN >=16 RESISTANT Resistant     IMIPENEM <=0.25 SENSITIVE Sensitive     NITROFURANTOIN 128 RESISTANT Resistant     TRIMETH/SULFA 80 RESISTANT Resistant     AMPICILLIN/SULBACTAM >=32 RESISTANT Resistant     PIP/TAZO 16 SENSITIVE Sensitive    Klebsiella pneumoniae - MIC*    AMPICILLIN >=32 RESISTANT Resistant     CEFAZOLIN >=64 RESISTANT Resistant     CEFEPIME >=32 RESISTANT Resistant     CEFTRIAXONE >=64 RESISTANT Resistant     CIPROFLOXACIN >=4 RESISTANT Resistant     GENTAMICIN <=1 SENSITIVE Sensitive     IMIPENEM 1 SENSITIVE Sensitive      NITROFURANTOIN 64 INTERMEDIATE Intermediate     TRIMETH/SULFA <=20 SENSITIVE Sensitive     AMPICILLIN/SULBACTAM >=32 RESISTANT Resistant     PIP/TAZO <=4 SENSITIVE Sensitive     * >=100,000 COLONIES/mL KLEBSIELLA PNEUMONIAE    80,000 COLONIES/mL KLEBSIELLA PNEUMONIAE      Radiology Studies: DG Lumbar Spine 2-3 Views  Result Date: 11/25/2022 CLINICAL DATA:  Back pain, no known injury. EXAM: LUMBAR SPINE - 2-3 VIEW COMPARISON:  Reformats from abdomen pelvis CT 2 days ago 11/23/2022 FINDINGS: Five non-rib-bearing lumbar vertebra. Unchanged appearance of L1 inferior endplate compression deformity, chronic appearing. Moderate diffuse degenerative disc disease with disc space narrowing and spurring. Moderate multilevel facet hypertrophy. No evidence of acute fracture. Mild sacroiliac degenerative change. IMPRESSION: 1. Moderate diffuse degenerative disc disease and facet hypertrophy. 2. Chronic L1 inferior endplate compression deformity. Electronically Signed   By: Narda Rutherford M.D.   On: 11/25/2022 15:30      LOS: 3 days    Jacquelin Hawking, MD [facility] 11/26/2022, 12:22 PM   If 7PM-7AM, please contact night-coverage [email or web address]

## 2022-11-26 NOTE — Progress Notes (Signed)
[facility] NEPHROLOGY PROGRESS NOTE  Assessment/ Plan: Pt is a 82 y.o. yo female   OP HD: SW MWF  3.5h   400/600   67kg   3K/2.5 bath  TDC  Heparin none - last OP HD 9/20, post wt 68.4kg - dry wt lowered 74 --> 67kg in last 3 wks - mircera 100 mcg IV q 2 wks, last 9/16, due 9/30  # Acute hypoxic resp failure/ pulmonary edema:  UF with HD, currently on around 2 to 3 L of oxygen.  # Covid infection - remains + from Aug 2024 represents recovered COVID illness from last month, seen by ID no need for isolation.   #ESRD - on HD MWF.  She had intradialytic hypotension on 9/23 therefore no UF. The patient is DNR and not wanting dialysis.  Initially made comfort care but now waiting to talk to palliative care to confirm GOC. Patient clearly does not want dialysis.  Pending palliative care discussion. I will hold off dialysis until I hear back from the team.  Discussed with Dr.Nettey.  #Chronic hypotension -she was on midodrine which was discontinued on 9/24 by primary team.  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    # Severe sepsis due to recurrent ESBL bacteremia, recurrent UTI/bladder tumor: Per urology, no biopsy while treating infection, seems like plan to follow outpatient by urology.  # DNR/GOC: Patient was lethargic and somnolent on 9/24 when she was made comfort care.  She is more alert awake today.  When I talk to her she does not want dialysis.  Noted palliative care team is consulted.  I agree with hospice/comfort care.   Subjective: Seen and examined.  She is alert awake but feels tired.  Her grandson was presented with her.  No new event.  She still does not want dialysis. Objective Vital signs in last 24 hours: Vitals:   11/25/22 1700 11/25/22 1950 11/26/22 0010 11/26/22 1007  BP: (!) 114/55 (!) 115/47    Pulse: 73 72 76   Resp: 14 14 14     Temp: 97.8 F (36.6 C) 97.8 F (36.6 C)  97.6 F (36.4 C)  TempSrc: Oral Oral  Axillary  SpO2: 99% 94% 99%   Weight:      Height:       Weight change:   Intake/Output Summary (Last 24 hours) at 11/26/2022 1054 Last data filed at 11/26/2022 0600 Gross per 24 hour  Intake 100 ml  Output 100 ml  Net 0 ml       Labs: RENAL PANEL Recent Labs  Lab 11/22/22 1520 11/22/22 1946 11/23/22 0306 11/24/22 0803  NA 132*  --  132* 133*  K 5.4*  --  4.6 3.7  CL 93*  --  95* 98  CO2 27  --  27 24  GLUCOSE 166*  --  141* 72  BUN 32*  --  37* 18  CREATININE 2.51*  --  2.79* 1.55*  CALCIUM 7.5*  --  7.6* 7.4*  PHOS  --  1.6*  --   --   ALBUMIN <1.5*  --  <1.5*  --     Liver Function Tests: Recent Labs  Lab 11/22/22 1520 11/23/22 0306  AST 62* 58*  ALT 19 22  ALKPHOS 394* 370*  BILITOT 1.3* 1.0  PROT 5.0* 4.7*  ALBUMIN <1.5* <1.5*   Recent Labs  Lab 11/22/22 1520  LIPASE 19   No results for input(s): "AMMONIA" in the last 168 hours. CBC: Recent Labs    08/09/22 1126 08/10/22 0105 09/07/22 0644 09/08/22 0244 09/09/22 0307 10/09/22 0223 10/09/22 0224 10/09/22 0555 10/09/22 1757 10/28/22 0355 10/30/22 0720 11/22/22 1520 11/22/22 1945 11/23/22 0306 11/24/22 0803  HGB  --    < > 7.1* 8.1*   < >  --    < >  --    < > 8.7* 10.9* 9.2*  --  9.2* 10.3*  MCV  --    < > 85.7 83.7   < >  --    < >  --    < > 92.7 89.7 90.4  --  91.1 89.7  VITAMINB12 1,892*  --  1,280*  --   --   --   --  744  --   --   --   --  4,144*  --   --   FOLATE 19.6  --  11.6  --   --   --   --  12.0  --   --   --   --   --   --   --   FERRITIN 177  --   --  136  --   --   --  269  --   --   --   --   --   --   --   TIBC 213*  --   --  157*  --   --   --  119*  --   --   --   --   --   --   --   IRON 33  --   --  20*  --   --   --  17*  --   --   --   --   --   --   --   RETICCTPCT 2.4  --   --   --   --  3.8*  --   --   --   --   --   --   --   --   --    < > = values in this interval not  displayed.    Cardiac Enzymes: Recent Labs  Lab 11/22/22 1520  CKTOTAL 29*   CBG: Recent Labs  Lab 11/24/22 0610 11/25/22 0905 11/25/22 1113 11/25/22 2047 11/26/22 0602  GLUCAP 92 58* 108* 110* 129*    Iron Studies: No results for input(s): "IRON", "TIBC", "TRANSFERRIN", "FERRITIN" in the last 72 hours. Studies/Results: DG Lumbar Spine 2-3 Views  Result Date: 11/25/2022 CLINICAL DATA:  Back pain, no known injury. EXAM: LUMBAR SPINE - 2-3 VIEW COMPARISON:  Reformats from abdomen pelvis CT 2 days ago 11/23/2022 FINDINGS: Five non-rib-bearing lumbar vertebra. Unchanged appearance of L1 inferior endplate compression deformity, chronic appearing. Moderate diffuse degenerative disc disease with disc space narrowing and spurring. Moderate multilevel facet hypertrophy. No evidence of acute fracture. Mild sacroiliac degenerative change. IMPRESSION: 1. Moderate diffuse degenerative disc disease and facet hypertrophy. 2. Chronic L1 inferior endplate compression deformity. Electronically Signed   By: Narda Rutherford M.D.   On: 11/25/2022 15:30    Medications: Infusions:  sodium chloride     meropenem (MERREM) IV Stopped (11/25/22 1434)    Scheduled Medications:  Chlorhexidine Gluconate Cloth  6 each Topical Q0600   Chlorhexidine Gluconate Cloth  6 each Topical Q0600   feeding supplement (NEPRO CARB STEADY)  237 mL Oral  BID BM   polyethylene glycol  17 g Oral Daily   sodium chloride flush  3 mL Intravenous Q12H    have reviewed scheduled and prn medications.  Physical Exam: General:  alert awake and following commands. Heart:RRR, s1s2 nl Lungs: Bibasal rhonchi, no increased work of breathing Abdomen:soft, Non-tender, non-distended Extremities:No edema Dialysis Access: Right IJ TDC in place.  Ikechukwu Cerny Prasad Maysel Mccolm 11/26/2022,10:54 AM  LOS: 3 days

## 2022-11-26 NOTE — Plan of Care (Signed)
  Problem: Respiratory: Goal: Will maintain a patent airway Outcome: Progressing   Problem: Education: Goal: Knowledge of risk factors and measures for prevention of condition will improve Outcome: Not Progressing   Problem: Respiratory: Goal: Complications related to the disease process, condition or treatment will be avoided or minimized Outcome: Not Progressing

## 2022-11-26 NOTE — Progress Notes (Signed)
   This pt has been referred to hospice services for our Hospice home in [facility]. I have discussed with family comfort and hospice care. They are in agreement. They do want to proceed with hospice services but we unfortunately do not have a bed to offer today. If something opens up later today we will reach back out to update the team.   The pt has been approved by our MD at [facility] for services at our facility.   Norm Parcel RN

## 2022-11-27 ENCOUNTER — Ambulatory Visit: Payer: Medicare Other | Admitting: Internal Medicine

## 2022-11-27 DIAGNOSIS — N189 Chronic kidney disease, unspecified: Secondary | ICD-10-CM

## 2022-11-27 DIAGNOSIS — U071 COVID-19: Secondary | ICD-10-CM | POA: Diagnosis not present

## 2022-11-27 DIAGNOSIS — Z515 Encounter for palliative care: Secondary | ICD-10-CM

## 2022-11-27 DIAGNOSIS — N390 Urinary tract infection, site not specified: Secondary | ICD-10-CM | POA: Diagnosis not present

## 2022-11-27 DIAGNOSIS — Z789 Other specified health status: Secondary | ICD-10-CM

## 2022-11-27 DIAGNOSIS — Z66 Do not resuscitate: Secondary | ICD-10-CM

## 2022-11-27 DIAGNOSIS — R6520 Severe sepsis without septic shock: Secondary | ICD-10-CM | POA: Diagnosis not present

## 2022-11-27 DIAGNOSIS — A419 Sepsis, unspecified organism: Secondary | ICD-10-CM | POA: Diagnosis not present

## 2022-11-27 LAB — GLUCOSE, CAPILLARY
Glucose-Capillary: 261 mg/dL — ABNORMAL HIGH (ref 70–99)
Glucose-Capillary: 287 mg/dL — ABNORMAL HIGH (ref 70–99)
Glucose-Capillary: 197 mg/dL — ABNORMAL HIGH (ref 70–99)
Glucose-Capillary: 182 mg/dL — ABNORMAL HIGH (ref 70–99)

## 2022-11-27 MED ORDER — INSULIN ASPART 100 UNIT/ML IJ SOLN
0.0000 [IU] | Freq: Three times a day (TID) | INTRAMUSCULAR | Status: DC
  Administered 2022-11-27: 1 [IU] via SUBCUTANEOUS

## 2022-11-27 MED ORDER — FENTANYL CITRATE PF 50 MCG/ML IJ SOSY
25.0000 ug | PREFILLED_SYRINGE | INTRAMUSCULAR | Status: DC | PRN
  Administered 2022-11-27: 25 ug via INTRAVENOUS
  Filled 2022-11-27: qty 1

## 2022-11-27 MED ORDER — OXYCODONE HCL 5 MG PO TABS
5.0000 mg | ORAL_TABLET | ORAL | Status: DC | PRN
  Administered 2022-11-27: 5 mg via ORAL
  Filled 2022-11-27: qty 1

## 2022-11-27 MED ORDER — GLYCOPYRROLATE 0.2 MG/ML IJ SOLN: 0.2000 mg | INTRAMUSCULAR | Status: DC | PRN

## 2022-11-27 MED ORDER — POLYETHYLENE GLYCOL 3350 17 G PO PACK: 17.0000 g | PACK | Freq: Every day | ORAL | Status: DC | PRN

## 2022-11-27 MED ORDER — NEPRO/CARBSTEADY PO LIQD: 237.0000 mL | ORAL | Status: DC | PRN

## 2022-11-27 MED ORDER — LORAZEPAM 2 MG/ML IJ SOLN: 1.0000 mg | INTRAMUSCULAR | Status: DC | PRN

## 2022-11-27 MED ORDER — INSULIN ASPART 100 UNIT/ML IJ SOLN
0.0000 [IU] | Freq: Three times a day (TID) | INTRAMUSCULAR | Status: DC
  Administered 2022-11-27: 3 [IU] via SUBCUTANEOUS

## 2022-11-27 NOTE — Progress Notes (Signed)
PROGRESS NOTE    [name] [name]  ACZ:[id number] DOB: 27-Oct-1940 DOA: 11/22/2022 PCP: [name], MD   Brief Narrative: [name] [name] is a 82 y.o. 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  Patient presented secondary to severe myalgias, dysuria, fatigue and was found to have evidence of sepsis.  Workup revealed evidence of ESBL Klebsiella pneumonia bacteremia in addition to UTI infectious ease was consulted.  Appropriate antibiotics prescribed.  During hospitalization, patient was transitioned to comfort measures secondary to poor toleration of hemodialysis. Patient now transitioned to comfort measures.    Assessment and Plan:  Severe sepsis Present on admission.  Secondary to ESBL Klebsiella pneumonia bacteremia from urinary source.  Patient managed on meropenem for treatment.  ESBL Klebsiella pneumoniae bacteremia ESBL Klebsiella pneumoniae urinary tract infection Patient with recurrent bacteremia with concern that this is likely related to the bladder mass identified through imaging.  Infectious disease consulted.  Patient is on meropenem.  No definitive treatment per ID given frequent recurrence with requirement for aggressive neurologic intervention needed. Patient is now comfort measures but family request to continue antibiotic treatment while inpatient.  Bladder mass CT chest/abdomen/pelvis on admission significant for evidence of moderate left hydroureteronephrosis in addition to an irregular mass in the left side of urinary bladder. Per chart review, case was discussed with urology who recommended no intervention in the acute infectious phase. Patient is now comfort measures. No urology follow-up.  Left-sided hydronephrosis Noted on CT imaging. Moderate in severity and likely related  to associated mass in area.  End-stage renal disease on hemodialysis Nephrology was consulted.  Patient was receiving hemodialysis while admitted.  Hemodialysis stopped secondary to transition to comfort measures. Patient is now confirmed comfort measures. Nephrology signed off.  Acute metabolic encephalopathy Possibly related to acute infection.  Appears to have improved.  Also could have been exacerbated secondary to severe hypotension during/after hemodialysis.  Appears mostly resolved.  Diabetes mellitus type 2 Uncontrolled with hyperglycemia.  Last hemoglobin A1c of 9.7%.  Associated renal disease.  Patient has had hypoglycemia this admission.  Patient is on Lantus 5 units daily at bedtime.  Insulin discontinued secondary to transition to comfort measures. Family requested continued insulin. SSI resumed.  Chronic systolic heart failure Stable.  Subclinical hypothyroidism TSH of 8.5 with free T4 of 0.61.  Patient is not on thyroid medication as an outpatient.  Chronic hypotension Patient is on midodrine which was held secondary to transition to comfort measures.  Currently, blood pressures are stable. Patient is comfort measures.  Paroxysmal atrial fibrillation with RVR Exacerbated by hypotension with/during hemodialysis.  Rates now better controlled.  Patient is on amiodarone as an outpatient which is currently held secondary to prior transition to comfort measures.  Patient is also on Eliquis 2.5 mg twice daily for stroke prophylaxis which is also held.  RVR improved with IV fluids. Patient is not comfort measures.  L1 compression fracture Noted on x-ray. Likely related to recent fall from one month prior. -Oxycodone PRN  CAD Patient is status post history of percutaneous coronary angioplasty.  Troponin mildly elevated at 34 on admission without associated chest pain.  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  Stable.  Hyperlipidemia Patient is on Lipitor as an  outpatient which is held.  Goals of care On 9/24, patient was transition to full comfort measures after discussion  per documentation.  Palliative care consulted secondary to confusion in goals of care with ultimate decision to transition to comfort measures and discharge to hospice facility.    DVT prophylaxis: None Code Status:   Code Status: Do not attempt resuscitation (DNR) - Comfort care Family Communication: Grandson at bedside Disposition Plan: Discharge to hospice facility once a bed is available   Consultants:  Nephrology Infectious disease Palliative care medicine  Procedures:  Hemodialysis  Antimicrobials: Ceftriaxone Meropenem Vancomycin   Subjective: Still with back pain. No other concerns.  Objective: BP (!) 104/41 (BP Location: Left Arm)   Pulse 71   Temp 97.8 F (36.6 C) (Oral)   Resp 16   Ht 5\' 9"  (1.753 m)   Wt 73.1 kg   SpO2 97%   BMI 23.80 kg/m   Examination:  General exam: Appears calm and comfortable   Data Reviewed: I have personally reviewed following labs and imaging studies  CBC Lab Results  Component Value Date   WBC 13.8 (H) 11/24/2022   RBC 3.51 (L) 11/24/2022   HGB 10.3 (L) 11/24/2022   HCT 31.5 (L) 11/24/2022   MCV 89.7 11/24/2022   MCH 29.3 11/24/2022   PLT 222 11/24/2022   MCHC 32.7 11/24/2022   RDW 19.8 (H) 11/24/2022   LYMPHSABS 0.8 11/22/2022   MONOABS 0.7 11/22/2022   EOSABS 0.0 11/22/2022   BASOSABS 0.0 11/22/2022     Last metabolic panel Lab Results  Component Value Date   NA 133 (L) 11/24/2022   K 3.7 11/24/2022   CL 98 11/24/2022   CO2 24 11/24/2022   BUN 18 11/24/2022   CREATININE 1.55 (H) 11/24/2022   GLUCOSE 72 11/24/2022   GFRNONAA 34 (L) 11/24/2022   GFRAA 26 (L) 07/20/2018   CALCIUM 7.4 (L) 11/24/2022   PHOS 1.6 (L) 11/22/2022   PROT 4.7 (L) 11/23/2022   ALBUMIN <1.5 (L) 11/23/2022   LABGLOB 3.7 09/07/2022   AGRATIO 0.4 (L) 09/07/2022   BILITOT 1.0 11/23/2022   ALKPHOS 370 (H)  11/23/2022   AST 58 (H) 11/23/2022   ALT 22 11/23/2022   ANIONGAP 11 11/24/2022    GFR: Estimated Creatinine Clearance: 30.3 mL/min (A) (by C-G formula based on SCr of 1.55 mg/dL (H)).  Recent Results (from the past 240 hour(s))  Culture, blood (Routine x 2)     Status: Abnormal   Collection Time: 11/22/22  2:21 PM   Specimen: BLOOD RIGHT HAND  Result Value Ref Range Status   Specimen Description BLOOD RIGHT HAND  Final   Special Requests   Final    BOTTLES DRAWN AEROBIC AND ANAEROBIC Blood Culture adequate volume   Culture  Setup Time   Final    GRAM NEGATIVE RODS AEROBIC BOTTLE ONLY CRITICAL RESULT CALLED TO, READ BACK BY AND VERIFIED WITH: PHARMD M. MITCHELL 11/23/22 @ 0559 BY AB Performed at [facility], [address], [place], [place] [address]    Culture (A)  Final    KLEBSIELLA PNEUMONIAE Confirmed Extended Spectrum Beta-Lactamase Producer (ESBL).  In bloodstream infections from ESBL organisms, carbapenems are preferred over piperacillin/tazobactam. They are shown to have a lower risk of mortality.    Report Status 11/25/2022 FINAL  Final   Organism ID, Bacteria KLEBSIELLA PNEUMONIAE  Final      Susceptibility   Klebsiella pneumoniae - MIC*    AMPICILLIN >=32 RESISTANT Resistant     CEFEPIME >=32 RESISTANT Resistant     CEFTAZIDIME 32 RESISTANT Resistant     CEFTRIAXONE >=64 RESISTANT  Resistant     CIPROFLOXACIN >=4 RESISTANT Resistant     GENTAMICIN >=16 RESISTANT Resistant     IMIPENEM 1 SENSITIVE Sensitive     TRIMETH/SULFA 160 RESISTANT Resistant     AMPICILLIN/SULBACTAM >=32 RESISTANT Resistant     PIP/TAZO 32 INTERMEDIATE Intermediate     * KLEBSIELLA PNEUMONIAE  Blood Culture ID Panel (Reflexed)     Status: Abnormal   Collection Time: 11/22/22  2:21 PM  Result Value Ref Range Status   Enterococcus faecalis NOT DETECTED NOT DETECTED Final   Enterococcus Faecium NOT DETECTED NOT DETECTED Final   Listeria monocytogenes NOT DETECTED NOT DETECTED Final    Staphylococcus species NOT DETECTED NOT DETECTED Final   Staphylococcus aureus (BCID) NOT DETECTED NOT DETECTED Final   Staphylococcus epidermidis NOT DETECTED NOT DETECTED Final   Staphylococcus lugdunensis NOT DETECTED NOT DETECTED Final   Streptococcus species NOT DETECTED NOT DETECTED Final   Streptococcus agalactiae NOT DETECTED NOT DETECTED Final   Streptococcus pneumoniae NOT DETECTED NOT DETECTED Final   Streptococcus pyogenes NOT DETECTED NOT DETECTED Final   A.calcoaceticus-baumannii NOT DETECTED NOT DETECTED Final   Bacteroides fragilis NOT DETECTED NOT DETECTED Final   Enterobacterales DETECTED (A) NOT DETECTED Final    Comment: Enterobacterales represent a large order of gram negative bacteria, not a single organism. CRITICAL RESULT CALLED TO, READ BACK BY AND VERIFIED WITH: PHARMD M. MITCHELL 11/23/22 @ 0559 BY AB    Enterobacter cloacae complex NOT DETECTED NOT DETECTED Final   Escherichia coli NOT DETECTED NOT DETECTED Final   Klebsiella aerogenes NOT DETECTED NOT DETECTED Final   Klebsiella oxytoca NOT DETECTED NOT DETECTED Final   Klebsiella pneumoniae DETECTED (A) NOT DETECTED Final    Comment: CRITICAL RESULT CALLED TO, READ BACK BY AND VERIFIED WITH: PHARMD M. MITCHELL 11/23/22 @ 0559 BY AB    Proteus species NOT DETECTED NOT DETECTED Final   Salmonella species NOT DETECTED NOT DETECTED Final   Serratia marcescens NOT DETECTED NOT DETECTED Final   Haemophilus influenzae NOT DETECTED NOT DETECTED Final   Neisseria meningitidis NOT DETECTED NOT DETECTED Final   Pseudomonas aeruginosa NOT DETECTED NOT DETECTED Final   Stenotrophomonas maltophilia NOT DETECTED NOT DETECTED Final   Candida albicans NOT DETECTED NOT DETECTED Final   Candida auris NOT DETECTED NOT DETECTED Final   Candida glabrata NOT DETECTED NOT DETECTED Final   Candida krusei NOT DETECTED NOT DETECTED Final   Candida parapsilosis NOT DETECTED NOT DETECTED Final   Candida tropicalis NOT DETECTED  NOT DETECTED Final   Cryptococcus neoformans/gattii NOT DETECTED NOT DETECTED Final   CTX-M ESBL DETECTED (A) NOT DETECTED Final    Comment: CRITICAL RESULT CALLED TO, READ BACK BY AND VERIFIED WITH: PHARMD M. MITCHELL 11/23/22 @ 0559 BY AB (NOTE) Extended spectrum beta-lactamase detected. Recommend a carbapenem as initial therapy.      Carbapenem resistance IMP NOT DETECTED NOT DETECTED Final   Carbapenem resistance KPC NOT DETECTED NOT DETECTED Final   Carbapenem resistance NDM NOT DETECTED NOT DETECTED Final   Carbapenem resist OXA 48 LIKE NOT DETECTED NOT DETECTED Final   Carbapenem resistance VIM NOT DETECTED NOT DETECTED Final    Comment: Performed at [facility], [address], [place], [place] [address]  Culture, blood (Routine x 2)     Status: Abnormal   Collection Time: 11/22/22  2:26 PM   Specimen: BLOOD  Result Value Ref Range Status   Specimen Description BLOOD RIGHT ANTECUBITAL  Final   Special Requests NONE  Final  Culture  Setup Time   Final    GRAM NEGATIVE RODS AEROBIC BOTTLE ONLY CRITICAL VALUE NOTED.  VALUE IS CONSISTENT WITH PREVIOUSLY REPORTED AND CALLED VALUE.    Culture (A)  Final    KLEBSIELLA PNEUMONIAE SUSCEPTIBILITIES PERFORMED ON PREVIOUS CULTURE WITHIN THE LAST 5 DAYS. Performed at [facility], [address] [place], [place] [address]    Report Status 11/25/2022 FINAL  Final  SARS Coronavirus 2 by RT PCR (hospital order, performed in [facility] hospital lab) *cepheid single result test* Anterior Nasal Swab     Status: Abnormal   Collection Time: 11/22/22  3:33 PM   Specimen: Anterior Nasal Swab  Result Value Ref Range Status   SARS Coronavirus 2 by RT PCR POSITIVE (A) NEGATIVE Final    Comment: Performed at [facility], [address], [place], [place] [address]  Urine Culture     Status: Abnormal   Collection Time: 11/22/22  4:21 PM   Specimen: Urine, Random  Result Value Ref Range Status   Specimen Description URINE,  RANDOM  Final   Special Requests   Final    NONE Reflexed from [id number] Performed at [facility], [address] [place], [place] [address]    Culture (A)  Final    >=100,000 COLONIES/mL KLEBSIELLA PNEUMONIAE 80,000 COLONIES/mL KLEBSIELLA PNEUMONIAE Confirmed Extended Spectrum Beta-Lactamase Producer (ESBL).  In bloodstream infections from ESBL organisms, carbapenems are preferred over piperacillin/tazobactam. They are shown to have a lower risk of mortality.    Report Status 11/25/2022 FINAL  Final   Organism ID, Bacteria KLEBSIELLA PNEUMONIAE (A)  Final   Organism ID, Bacteria KLEBSIELLA PNEUMONIAE (A)  Final      Susceptibility   Klebsiella pneumoniae - MIC*    AMPICILLIN >=32 RESISTANT Resistant     CEFAZOLIN >=64 RESISTANT Resistant     CEFEPIME >=32 RESISTANT Resistant     CEFTRIAXONE >=64 RESISTANT Resistant     CIPROFLOXACIN >=4 RESISTANT Resistant     GENTAMICIN >=16 RESISTANT Resistant     IMIPENEM <=0.25 SENSITIVE Sensitive     NITROFURANTOIN 128 RESISTANT Resistant     TRIMETH/SULFA 80 RESISTANT Resistant     AMPICILLIN/SULBACTAM >=32 RESISTANT Resistant     PIP/TAZO 16 SENSITIVE Sensitive    Klebsiella pneumoniae - MIC*    AMPICILLIN >=32 RESISTANT Resistant     CEFAZOLIN >=64 RESISTANT Resistant     CEFEPIME >=32 RESISTANT Resistant     CEFTRIAXONE >=64 RESISTANT Resistant     CIPROFLOXACIN >=4 RESISTANT Resistant     GENTAMICIN <=1 SENSITIVE Sensitive     IMIPENEM 1 SENSITIVE Sensitive     NITROFURANTOIN 64 INTERMEDIATE Intermediate     TRIMETH/SULFA <=20 SENSITIVE Sensitive     AMPICILLIN/SULBACTAM >=32 RESISTANT Resistant     PIP/TAZO <=4 SENSITIVE Sensitive     * >=100,000 COLONIES/mL KLEBSIELLA PNEUMONIAE    80,000 COLONIES/mL KLEBSIELLA PNEUMONIAE      Radiology Studies: DG Lumbar Spine 2-3 Views  Result Date: 11/25/2022 CLINICAL DATA:  Back pain, no known injury. EXAM: LUMBAR SPINE - 2-3 VIEW COMPARISON:  Reformats from abdomen pelvis CT 2 days  ago 11/23/2022 FINDINGS: Five non-rib-bearing lumbar vertebra. Unchanged appearance of L1 inferior endplate compression deformity, chronic appearing. Moderate diffuse degenerative disc disease with disc space narrowing and spurring. Moderate multilevel facet hypertrophy. No evidence of acute fracture. Mild sacroiliac degenerative change. IMPRESSION: 1. Moderate diffuse degenerative disc disease and facet hypertrophy. 2. Chronic L1 inferior endplate compression deformity. Electronically Signed  By: Narda Rutherford M.D.   On: 11/25/2022 15:30      LOS: 4 days    Jacquelin Hawking, MD [facility] 11/27/2022, 11:44 AM   If 7PM-7AM, please contact night-coverage [email or web address]

## 2022-11-27 NOTE — Progress Notes (Signed)
Contacted [facility] to advise clinic that pt will not be requiring any further HD.   Olivia Canter Renal Navigator [phone]

## 2022-11-27 NOTE — TOC Transition Note (Signed)
Transition of Care [facility]) - CM/SW Discharge Note   Patient Details  Name: [name] [name] MRN: [id number] Date of Birth: 05-Sep-1940  Transition of Care [facility]) CM/SW Contact:  Deatra Robinson, [place] Phone Number: 11/27/2022, 2:56 PM   Clinical Narrative:  per Sherron Monday with [facility], admission consents completed with family at bedside and they are prepared to admit pt. RN provided with number for report [phone] and [facility] arranged for transport. SW signing off at dc.   Dellie Burns, MSW, LCSW [phone] (coverage)      Final next level of care: Hospice Medical Facility Barriers to Discharge: No Barriers Identified   Patient Goals and CMS Choice      Discharge Placement                  Patient to be transferred to facility by: [facility] Name of family member notified: [name]/grandson Patient and family notified of of transfer: 11/27/22  Discharge Plan and Services Additional resources added to the After Visit Summary for                                       Social Determinants of Health (SDOH) Interventions SDOH Screenings   Food Insecurity: No Food Insecurity (11/22/2022)  Housing: Low Risk  (11/22/2022)  Transportation Needs: No Transportation Needs (11/22/2022)  Utilities: Not At Risk (11/22/2022)  Alcohol Screen: Low Risk  (08/22/2020)  Depression (PHQ2-9): Low Risk  (08/21/2022)  Financial Resource Strain: Low Risk  (08/22/2020)  Physical Activity: Inactive (08/22/2020)  Social Connections: Moderately Integrated (08/22/2020)  Stress: No Stress Concern Present (08/22/2020)  Tobacco Use: Medium Risk (11/22/2022)     Readmission Risk Interventions    10/26/2022    1:22 PM 08/06/2022    3:31 PM 07/23/2022   12:43 PM  Readmission Risk Prevention Plan  Transportation Screening Complete Complete Complete  PCP or Specialist Appt within 5-7 Days   Complete  PCP or Specialist Appt within 3-5 Days  Complete   Home Care Screening   Complete   Medication Review (RN CM)   Referral to Pharmacy  HRI or Home Care Consult  Complete   Social Work Consult for Recovery Care Planning/Counseling  --   Palliative Care Screening  Not Applicable   Medication Review Oceanographer) Complete Complete   PCP or Specialist appointment within 3-5 days of discharge Complete    HRI or Home Care Consult Complete    SW Recovery Care/Counseling Consult Complete    Palliative Care Screening Not Applicable    Skilled Nursing Facility Complete

## 2022-11-27 NOTE — TOC Progression Note (Signed)
Transition of Care [facility]) - Progression Note    Patient Details  Name: [name] [name] MRN: [id number] Date of Birth: May 21, 1940  Transition of Care [facility]) CM/SW Contact  Dellie Burns [place], [place] Phone Number: 11/27/2022, 1:45 PM  Clinical Narrative:  This SW added to secure chat re dc to [facility] today. Per secure chat, CSW Joi made referral to [facility] yesterday per family request. No documentation in EMR from [facility] yesterday re referral to [facility]. Pt's grandson at bedside and confirms preference for [facility]. Spoke to [place] with [facility] who confirms pt has been accepted and they have a bed available for pt today.   Dellie Burns, MSW, LCSW [phone] (coverage)        Barriers to Discharge: No Barriers Identified  Expected Discharge Plan and Services                                               Social Determinants of Health (SDOH) Interventions SDOH Screenings   Food Insecurity: No Food Insecurity (11/22/2022)  Housing: Low Risk  (11/22/2022)  Transportation Needs: No Transportation Needs (11/22/2022)  Utilities: Not At Risk (11/22/2022)  Alcohol Screen: Low Risk  (08/22/2020)  Depression (PHQ2-9): Low Risk  (08/21/2022)  Financial Resource Strain: Low Risk  (08/22/2020)  Physical Activity: Inactive (08/22/2020)  Social Connections: Moderately Integrated (08/22/2020)  Stress: No Stress Concern Present (08/22/2020)  Tobacco Use: Medium Risk (11/22/2022)    Readmission Risk Interventions    10/26/2022    1:22 PM 08/06/2022    3:31 PM 07/23/2022   12:43 PM  Readmission Risk Prevention Plan  Transportation Screening Complete Complete Complete  PCP or Specialist Appt within 5-7 Days   Complete  PCP or Specialist Appt within 3-5 Days  Complete   Home Care Screening   Complete  Medication Review (RN CM)   Referral to Pharmacy  HRI or Home Care Consult  Complete   Social Work Consult  for Recovery Care Planning/Counseling  --   Palliative Care Screening  Not Applicable   Medication Review [employer]) Complete Complete   PCP or Specialist appointment within 3-5 days of discharge Complete    HRI or Home Care Consult Complete    SW Recovery Care/Counseling Consult Complete    Palliative Care Screening Not Applicable    Skilled Nursing Facility Complete

## 2022-11-27 NOTE — Progress Notes (Signed)
Brief nephrology note: The chart reviewed and discussed with Dr.Nettey. Patient was seen by palliative care team and patient is now full comfort care and going to hospice care.  No plan for dialysis.  Please call us back with question.  Crista Elliot, MD [facility].

## 2022-11-27 NOTE — Progress Notes (Signed)
Daily Progress Note   Patient Name: [name] [name]       Date: 11/27/2022 DOB: 08/15/1940  Age: 82 y.o. MRN#: [id number] Attending Physician: Narda Bonds, MD Primary Care Physician: [name], MD Admit Date: 11/22/2022  Reason for Consultation/Follow-up: Non pain symptom management, Pain control, Psychosocial/spiritual support, and Terminal Care  Subjective: I have reviewed medical records including EPIC notes, MAR, and labs. Patient has been accepted to [facility] - waiting for bed availability. Received report from primary RN - no acute concerns. RN reports patient was lethargic this morning.   Went to visit patient at bedside - no family/visitors present. Patient was lying in bed awake, alert, oriented (to self, place, time, not situation), and able to participate in simple conversation. Gestures of pain noted. No respiratory distress, increased work of breathing, or secretions noted. Patient endorses leg pain - RN administered pain medication while I was present. Patient is on 2L O2 [place].  All questions and concerns addressed.   Length of Stay: 4  Current Medications: Scheduled Meds:    Continuous Infusions:  meropenem (MERREM) IV Stopped (11/26/22 1732)    PRN Meds: feeding supplement (NEPRO CARB STEADY), fentaNYL (SUBLIMAZE) injection, glycopyrrolate, LORazepam, [DISCONTINUED] ondansetron **OR** ondansetron (ZOFRAN) IV, oxyCODONE, polyethylene glycol  Physical Exam Vitals and nursing note reviewed.  Constitutional:      General: She is not in acute distress.    Appearance: She is ill-appearing.  Pulmonary:     Effort: No respiratory distress.  Skin:    General: Skin is warm and dry.  Neurological:     Mental Status: She is alert and oriented to person,  place, and time.     Motor: Weakness present.  Psychiatric:        Attention and Perception: Attention normal.        Behavior: Behavior is cooperative.        Cognition and Memory: Cognition normal.             Vital Signs: BP (!) 104/41 (BP Location: Left Arm)   Pulse 71   Temp 97.8 F (36.6 C) (Oral)   Resp 16   Ht 5\' 9"  (1.753 m)   Wt 73.1 kg   SpO2 97%   BMI 23.80 kg/m  SpO2: SpO2: 97 % O2 Device: O2 Device: Nasal  Cannula O2 Flow Rate: O2 Flow Rate (L/min): 2 L/min  Intake/output summary:  Intake/Output Summary (Last 24 hours) at 11/27/2022 1059 Last data filed at 11/27/2022 1610 Gross per 24 hour  Intake 100 ml  Output --  Net 100 ml   LBM: Last BM Date :  (PTA) Baseline Weight: Weight: 72 kg Most recent weight: Weight: 73.1 kg       Palliative Assessment/Data: PPS 20-30%      Patient Active Problem List   Diagnosis Date Noted   Acute metabolic encephalopathy 11/24/2022   Bladder tumor 11/23/2022   Chronic systolic CHF (congestive heart failure) (HCC) 11/22/2022   Type 2 diabetes mellitus with chronic kidney disease, with long-term current use of insulin (HCC) 11/22/2022   UTI (urinary tract infection) in setting of recurrent UTI 11/22/2022   Malnutrition of moderate degree 10/28/2022   Acute metabolic encephalopathy due to COVID 10/26/2022   Chronic hypotension 10/26/2022   Abnormal results of thyroid function studies 10/26/2022   History of bacteremia 10/26/2022   Hemodialysis catheter infection (HCC) 10/13/2022   ESBL (extended spectrum beta-lactamase) producing bacteria infection 10/13/2022   Pressure injury of skin 10/13/2022   Cardiac arrest (HCC) 10/07/2022   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), stage IV requiring dialysis (HCC) 10/07/2022   Cardiac arrest, cause unspecified (HCC) 10/07/2022   Anemia due to chronic kidney disease 09/09/2022   Acute on chronic systolic CHF (congestive heart failure) (HCC) 09/09/2022   ESBL Bacteremia due to UTI  09/07/2022   Acute CVA (cerebrovascular accident) (HCC) 09/06/2022   Paroxysmal atrial fibrillation (HCC) 08/04/2022   Atrial fibrillation with rapid ventricular response (HCC) 08/03/2022   Acute renal failure superimposed on stage 4 chronic kidney disease (HCC) 07/20/2022   Supratherapeutic INR 07/08/2022   Hemorrhagic cystitis 07/08/2022   Gross hematuria 07/07/2022   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   Osteoporosis 07/22/2018   Closed compression fracture of L1 vertebra (HCC) 07/19/2018   Cardiomyopathy (HCC) 04/07/2018   Foot ulcer (HCC) 07/24/2016   Gram-negative infection    Postoperative anemia due to acute blood loss 01/04/2016   CAD S/P percutaneous coronary angioplasty with mildly elevatd troponin 05/20/2015   Acute systolic CHF (congestive heart failure) (HCC) 05/20/2015   Acute kidney injury superimposed on chronic kidney disease (HCC) 05/11/2015   PCP NOTES >>>>>>>>>>>>>>>> 12/28/2014   Memory loss 05/17/2014   Insulin-dependent type 2 diabetes with hypoglycemia (HCC) 01/23/2014   Anxiety and depression, PCP notes  11/24/2013   Severe obesity (BMI >= 40) (HCC) 07/14/2013   Encounter for therapeutic drug monitoring 06/02/2013   GERD (gastroesophageal reflux disease) 10/05/2011   Seizure disorder (HCC) 05/24/2011   Severe sepsis due to recurrent ESBL bacteremia 05/24/2011   Hyponatremia 05/17/2011   LUNG NODULE 09/01/2006   Hyperlipidemia 03/27/2006   Hereditary and idiopathic peripheral neuropathy 03/27/2006   RETINOPATHY, BACKGROUND NOS 03/27/2006   Essential hypertension 03/27/2006    Palliative Care Assessment & Plan   Patient Profile: 82 y.o. female  with past medical history of ESRD on dialysis MWF, atrial fibrillation on eliquis, T2DM, GERD, hypotension on midodrine, HLD, recurrent UTIs, seizures, systolic CHF, CVA, admitted on 11/22/2022 with dysuria, fatigue and back pain.    Patient has had 7 hospitalizations in the past 6 months.  Most recent  hospitalization 8/26 to 8/30 for AMS secondary to hypoglycemia and covid infection. She was also hospitalized 8/7 to 8/17 during which she was found to have ESBL bacteremia, EF of 25%.     She is currently admitted for  severe sepsis due to recurrent ESBL bacteremia and UTI, acute hypoxic respiratory failure with pulmonary edema, AKI on CKD 4 requiring dialysis. Patient has expressed interest in comfort focused care during this admission, though there was uncertainty about whether to discontinue dialysis or not.   PMT has been consulted to assist with goals of care conversation.  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 Copy was made and will be scanned into Vynca/ACP tab Transfer to [facility] when bed available Added orders for EOL symptom management and to reflect full comfort measures, as well as discontinued orders that were not focused on comfort Unrestricted visitation orders were placed per current [place] EOL visitation policy  Nursing to provide frequent assessments and administer PRN medications as clinically necessary to ensure EOL  comfort PMT will continue to follow and support holistically  Symptom Management Fentanyl PRN severe pain/dyspnea/increased work of breathing/RR>25 Oxycodone PRN moderate pain Robinul PRN secretions Ativan PRN anxiety/seizure/sleep/distress Zofran PRN nausea/vomiting Continue antibiotics until discharge per previous family request    Goals of Care and Additional Recommendations: Limitations on Scope of Treatment: Full Comfort Care  Code Status:    Code Status Orders  (From admission, onward)           Start     Ordered   11/27/22 0831  Do not attempt resuscitation (DNR) - Comfort care  (Code Status)  Continuous       Question Answer Comment  If patient has no pulse and is not breathing Do Not Attempt Resuscitation   In Pre-Arrest Conditions (Patient Is Breathing and Has a Pulse) Provide comfort measures. Relieve any mechanical airway obstruction. Avoid transfer unless required for comfort.   Consent: Discussion documented in EHR or advanced directives reviewed      11/27/22 0830           Code Status History     Date Active Date Inactive Code Status Order ID Comments User Context   11/22/2022 1826 11/27/2022 0830 Limited: Do not attempt resuscitation (DNR) -DNR-LIMITED -Do Not Intubate/DNI  [id number]  Orland Mustard, MD ED   11/22/2022 1757 11/22/2022 1826 Limited: Do not attempt resuscitation (DNR) -DNR-LIMITED -Do Not Intubate/DNI  [id number]  Orland Mustard, MD ED   10/26/2022 [phone] 10/31/2022 0516 DNR [id number]  Clydie Braun, MD ED   10/07/2022 1536 10/17/2022 2003 DNR [id number]  Steffanie Dunn, DO ED   10/07/2022 1536 10/07/2022 1536 DNR [id number]  Steffanie Dunn, DO ED   10/07/2022 1534 10/07/2022 1536 DNR [id number]  Marrianne Mood, MD ED   09/16/2022 1215 09/24/2022 1651 DNR [id number]  Joylene Draft, NP Inpatient   09/06/2022 1256 09/16/2022 1215 Full Code [id number]  Kirby Crigler, Mir  M, MD ED   08/04/2022 1916 08/12/2022 1614 Full Code [id number]  Emeline General, MD Inpatient    07/20/2022 1457 07/23/2022 1802 Full Code [id number]  Emeline General, MD ED   07/08/2022 0944 07/10/2022 1919 Full Code [id number]  Maryln Gottron, MD Inpatient   07/19/2018 0002 07/20/2018 1758 Full Code [id number]  Eduard Clos, MD ED   10/02/2016 1700 10/03/2016 1516 Full Code [id number]  Pieter Partridge, MD Inpatient   06/15/2016 1704 06/18/2016 1929 Full Code [id number]  Isaiah Blakes, PA-C ED   01/03/2016 1315 01/06/2016 2040 Full Code [id number]  Naida Sleight, PA-C Inpatient   01/02/2016 1626 01/03/2016 1315 Full Code [id number]  Naida Sleight, PA-C Inpatient   12/02/2015 1231 12/05/2015 1853 Full Code [id number]  Eldred Manges, MD Inpatient   12/01/2015 1511 12/02/2015 1231 Full Code [id number]  Gwenyth Bender, NP ED   09/14/2015 2109 09/18/2015 1733 Full Code [id number]  Gery Pray, MD Inpatient   05/17/2015 0053 05/21/2015 1819 Full Code [id number]  Eduard Clos, MD Inpatient   05/07/2015 0039 05/11/2015 2037 Full Code [id number]  Eduard Clos, MD ED   01/22/2014 2245 01/24/2014 1839 Full Code [id number]  Hillary Bow, DO Inpatient   11/18/2012 1859 11/21/2012 1920 Full Code [id number]  Clydia Llano, MD Inpatient   03/03/2012 1302 03/04/2012 1858 Full Code [id number]  Eddie North, MD Inpatient   05/22/2011 1403 06/02/2011 1927 Full Code [id number]  Gwynneth Macleod ED   05/16/2011 2054 05/19/2011 1911 Full Code [id number]  Ron Parker, MD Inpatient       Prognosis:  < 2 weeks  Discharge Planning: Hospice facility  Care plan was discussed with primary RN, patient  Thank you for allowing the Palliative Medicine Team to assist in the care of this patient.  Haskel Khan, NP  Please contact Palliative Medicine Team phone at [phone] for questions and concerns.   *Portions of this note are a verbal dictation therefore any spelling and/or grammatical errors are due to the "[employer]agon [employer] One" system interpretation.

## 2022-11-27 NOTE — Discharge Summary (Signed)
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 *  Hospital Course: [name] [name] is a 82 y.o. 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  Patient presented secondary to severe myalgias, dysuria, fatigue and was found to have evidence of sepsis.  Workup revealed evidence of ESBL Klebsiella pneumonia bacteremia in addition to UTI infectious ease was consulted.  Appropriate antibiotics prescribed.  During hospitalization, patient was transitioned to comfort measures secondary to poor toleration of hemodialysis. Patient now transitioned to comfort measures.  Assessment and Plan:  Severe sepsis Present on admission.  Secondary to ESBL Klebsiella pneumonia bacteremia from urinary source.  Patient managed  on meropenem for treatment.   ESBL Klebsiella pneumoniae bacteremia ESBL Klebsiella pneumoniae urinary tract infection Patient with recurrent bacteremia with concern that this is likely related to the bladder mass identified through imaging.  Infectious disease consulted.  Patient is on meropenem.  No definitive treatment per ID given frequent recurrence with requirement for aggressive neurologic intervention needed. Patient is now comfort measures but family request to continue antibiotic treatment while inpatient.   Bladder mass CT chest/abdomen/pelvis on admission significant for evidence of moderate left hydroureteronephrosis in addition to an irregular mass in the left side of urinary bladder. Per chart review, case was discussed with urology who recommended no intervention in the acute infectious phase. Patient is now comfort measures. No urology follow-up.   Left-sided hydronephrosis Noted on CT imaging. Moderate in severity and likely related to associated mass in area.   End-stage renal disease on hemodialysis Nephrology was consulted.  Patient was receiving hemodialysis while admitted.  Hemodialysis stopped secondary to transition to comfort measures. Patient is now confirmed comfort measures. Nephrology signed off.   Acute metabolic encephalopathy Possibly related to acute infection.  Appears to have improved.  Also could have been exacerbated secondary to severe hypotension during/after hemodialysis.  Appears mostly resolved.   Diabetes mellitus type 2 Uncontrolled with hyperglycemia.  Last hemoglobin A1c of 9.7%.  Associated renal disease.  Patient has had hypoglycemia this admission.  Patient is on Lantus 5 units daily at bedtime.  Insulin discontinued secondary to transition to comfort measures. Family requested continued insulin. SSI resumed while inpatient.   Chronic systolic heart failure Stable.   Subclinical hypothyroidism TSH of 8.5 with free T4 of 0.61.  Patient is not  on thyroid medication as  an outpatient.   Chronic hypotension Patient is on midodrine which was held secondary to transition to comfort measures.  Currently, blood pressures are stable. Patient is comfort measures.   Paroxysmal atrial fibrillation with RVR Exacerbated by hypotension with/during hemodialysis.  Rates now better controlled.  Patient is on amiodarone as an outpatient which is currently held secondary to prior transition to comfort measures.  Patient is also on Eliquis 2.5 mg twice daily for stroke prophylaxis which is also held.  RVR improved with IV fluids. Patient is not comfort measures.   L1 compression fracture Noted on x-ray. Likely related to recent fall from one month prior. Pain management.   CAD Patient is status post history of percutaneous coronary angioplasty.  Troponin mildly elevated at 34 on admission without associated chest pain.   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  Stable.   Hyperlipidemia Patient is on Lipitor as an outpatient which is held.   Goals of care On 9/24, patient was transition to full comfort measures after discussion per documentation.  Palliative care consulted secondary to confusion in goals of care with ultimate decision to transition to comfort measures and discharge to hospice facility.   Consultants:  Nephrology Infectious disease Palliative care medicine   Procedures:  Hemodialysis  Disposition: Hospice care Diet recommendation: Regular diet  DISCHARGE MEDICATION: Allergies as of 11/27/2022       Reactions   Procaine Hcl Anaphylaxis   Amoxicillin Itching        Medication List     STOP taking these medications    acetaminophen 325 MG tablet Commonly known as: TYLENOL   alendronate 35 MG tablet Commonly known as: FOSAMAX   amiodarone 200 MG tablet Commonly known as: PACERONE   apixaban 2.5 MG Tabs tablet Commonly known as: ELIQUIS   atorvastatin 40 MG tablet Commonly known as:  LIPITOR   cholecalciferol 25 MCG (1000 UNIT) tablet Commonly known as: VITAMIN D3   ezetimibe 10 MG tablet Commonly known as: ZETIA   feeding supplement (PRO-STAT SUGAR FREE 64) Liqd   furosemide 80 MG tablet Commonly known as: LASIX   insulin glargine 100 UNIT/ML Solostar Pen Commonly known as: LANTUS   midodrine 5 MG tablet Commonly known as: PROAMATINE   MULTIVITAMIN ADULT PO   nystatin powder Commonly known as: MYCOSTATIN/NYSTOP   pantoprazole 40 MG tablet Commonly known as: PROTONIX   polyethylene glycol 17 g packet Commonly known as: MIRALAX / GLYCOLAX   sodium chloride 0.9 % infusion        Discharge Exam: BP (!) 110/48 (BP Location: Left Arm)   Pulse 72   Temp 97.8 F (36.6 C) (Oral)   Resp 14   Ht 5\' 9"  (1.753 m)   Wt 73.1 kg   SpO2 99%   BMI 23.80 kg/m   General exam: Appears calm and comfortable   Condition at discharge: Comfort measures  The results of significant diagnostics from this hospitalization (including imaging, microbiology, ancillary and laboratory) are listed below for reference.   Imaging Studies: DG Lumbar Spine 2-3 Views  Result Date: 11/25/2022 CLINICAL DATA:  Back pain, no known injury. EXAM: LUMBAR SPINE - 2-3 VIEW COMPARISON:  Reformats from abdomen pelvis CT 2 days ago 11/23/2022 FINDINGS: Five non-rib-bearing lumbar vertebra. Unchanged appearance of L1 inferior endplate compression deformity, chronic appearing. Moderate diffuse degenerative disc disease with disc space narrowing and spurring. Moderate multilevel facet hypertrophy. No evidence of acute fracture. Mild sacroiliac degenerative change. IMPRESSION: 1. Moderate diffuse degenerative disc disease and facet  hypertrophy. 2. Chronic L1 inferior endplate compression deformity. Electronically Signed   By: Narda Rutherford M.D.   On: 11/25/2022 15:30   CT CHEST ABDOMEN PELVIS WO CONTRAST  Result Date: 11/23/2022 CLINICAL DATA:  Bacteremia. EXAM: CT CHEST, ABDOMEN AND  PELVIS WITHOUT CONTRAST TECHNIQUE: Multidetector CT imaging of the chest, abdomen and pelvis was performed following the standard protocol without IV contrast. RADIATION DOSE REDUCTION: This exam was performed according to the departmental dose-optimization program which includes automated exposure control, adjustment of the mA and/or kV according to patient size and/or use of iterative reconstruction technique. COMPARISON:  October 07, 2022. FINDINGS: CT CHEST FINDINGS Cardiovascular: Atherosclerosis of thoracic aorta without aneurysm formation. Mild cardiomegaly. No pericardial effusion. Right internal jugular catheter noted with tip at cavoatrial junction. Coronary artery calcifications are noted. Mediastinum/Nodes: No enlarged mediastinal, hilar, or axillary lymph nodes. Thyroid gland, trachea, and esophagus demonstrate no significant findings. Lungs/Pleura: Small bilateral pleural effusions are noted with adjacent subsegmental atelectasis. No pneumothorax is noted. Mild patchy airspace opacities are noted in the upper lobes concerning for multifocal pneumonia. Musculoskeletal: Interval development of minimally displaced manubrial fracture. CT ABDOMEN PELVIS FINDINGS Hepatobiliary: Cholelithiasis. Mild gallbladder distention. No biliary dilatation. Liver is unremarkable. Pancreas: Pancreatic atrophy is noted. 12 mm rounded density seen in pancreatic tail. Possible 18 mm rounded abnormality seen in pancreatic head. Spleen: Normal in size without focal abnormality. Adrenals/Urinary Tract: Adrenal glands appear normal. Right renal cysts are noted for which no further follow-up is required. Moderate left hydroureteronephrosis is noted without obstructing calculus. This appears to be due to irregular mass along the left side of the urinary bladder highly concerning for malignancy. Stomach/Bowel: Stomach is within normal limits. Appendix appears normal. No evidence of bowel wall thickening, distention, or inflammatory  changes. Vascular/Lymphatic: Aortic atherosclerosis. No enlarged abdominal or pelvic lymph nodes. Reproductive: Uterus and bilateral adnexa are unremarkable. Other: No abdominal wall hernia or abnormality. No abdominopelvic ascites. Musculoskeletal: No acute or significant osseous findings. IMPRESSION: Moderate left hydroureteronephrosis is noted which appears to be due to irregular mass in left side of urinary bladder concerning for malignancy. Cystoscopy is recommended. Small bilateral pleural effusions are noted with adjacent subsegmental atelectasis. Mild patchy airspace opacities are noted in the upper lobes bilaterally concerning for multifocal pneumonia. Minimally displaced manubrial fracture is now noted. 12 mm rounded density seen in pancreatic tail. Possible 18 mm rounded density seen in pancreatic head. MRI is recommended to rule out possible neoplasm. Cholelithiasis. Aortic Atherosclerosis (ICD10-I70.0). Electronically Signed   By: Lupita Raider M.D.   On: 11/23/2022 14:46   CT Head Wo Contrast  Result Date: 11/22/2022 CLINICAL DATA:  Headache, new onset (Age >= 51y) EXAM: CT HEAD WITHOUT CONTRAST TECHNIQUE: Contiguous axial images were obtained from the base of the skull through the vertex without intravenous contrast. RADIATION DOSE REDUCTION: This exam was performed according to the departmental dose-optimization program which includes automated exposure control, adjustment of the mA and/or kV according to patient size and/or use of iterative reconstruction technique. COMPARISON:  10/26/2022 FINDINGS: Brain: No evidence of acute infarction, hemorrhage, hydrocephalus, extra-axial collection or mass lesion/mass effect. Extensive left temporal lobe encephalomalacia, unchanged. Patchy low-density changes within the periventricular and subcortical white matter most compatible with chronic microvascular ischemic change. Mild diffuse cerebral volume loss. Vascular: Atherosclerotic calcifications  involving the large vessels of the skull base. No unexpected hyperdense vessel. Skull: Normal. Negative for fracture or focal lesion. Sinuses/Orbits: No acute finding. Other: None. IMPRESSION: 1. No acute intracranial findings. 2. Chronic microvascular ischemic  change and cerebral volume loss. 3. Chronic left temporal lobe encephalomalacia. Electronically Signed   By: Duanne Guess D.O.   On: 11/22/2022 17:33   DG Chest Port 1 View  Result Date: 11/22/2022 CLINICAL DATA:  [id number] Sepsis [facility]) [id number] EXAM: PORTABLE CHEST 1 VIEW COMPARISON:  October 26, 2022 FINDINGS: The cardiomediastinal silhouette is unchanged and enlarged in contour.RIGHT chest CVC with tip terminating over the superior cavoatrial junction. Small LEFT pleural effusion. No pneumothorax. Persistent LEFT retrocardiac opacity. Mild interstitial prominence. Atherosclerotic calcifications. IMPRESSION: 1. Persistent LEFT retrocardiac opacity with small LEFT pleural effusion. Findings likely reflect atelectasis. 2. Chronic background of mild pulmonary edema. Electronically Signed   By: Meda Klinefelter M.D.   On: 11/22/2022 15:44   IR Removal Tun Cv Cath W/O FL  Result Date: 10/29/2022 INDICATION: Tunneled right IJ central venous catheter placed 10/16/2022 is no longer needed. Request for removal. EXAM: REMOVAL OF TUNNELED RIGHT IJ CENTRAL VENOUS CATHETER MEDICATIONS: None COMPLICATIONS: None immediate. PROCEDURE: Informed written consent was obtained from the patient following an explanation of the procedure, risks, benefits and alternatives to treatment. A time out was performed prior to the initiation of the procedure. Maximal barrier sterile technique was utilized including caps, mask, sterile gowns, sterile gloves, large sterile drape, hand hygiene, and chlorhexidine. Utilizing a combination of blunt dissection and gentle traction, the cuff of the catheter was exposed and the catheter was removed intact. Hemostasis was obtained with  manual compression. A dressing was placed. The patient tolerated the procedure well without immediate post procedural complication. IMPRESSION: Successful removal of a RIGHT chest tunneled hemodialysis catheter. Procedure performed by Brayton El PA-C and supervised by Dr. Roanna Banning Electronically Signed   By: Roanna Banning M.D.   On: 10/29/2022 15:00    Microbiology: Results for orders placed or performed during the hospital encounter of 11/22/22  Culture, blood (Routine x 2)     Status: Abnormal   Collection Time: 11/22/22  2:21 PM   Specimen: BLOOD RIGHT HAND  Result Value Ref Range Status   Specimen Description BLOOD RIGHT HAND  Final   Special Requests   Final    BOTTLES DRAWN AEROBIC AND ANAEROBIC Blood Culture adequate volume   Culture  Setup Time   Final    GRAM NEGATIVE RODS AEROBIC BOTTLE ONLY CRITICAL RESULT CALLED TO, READ BACK BY AND VERIFIED WITH: PHARMD M. MITCHELL 11/23/22 @ 0559 BY AB Performed at [facility], [address], [place], [place] [address]    Culture (A)  Final    KLEBSIELLA PNEUMONIAE Confirmed Extended Spectrum Beta-Lactamase Producer (ESBL).  In bloodstream infections from ESBL organisms, carbapenems are preferred over piperacillin/tazobactam. They are shown to have a lower risk of mortality.    Report Status 11/25/2022 FINAL  Final   Organism ID, Bacteria KLEBSIELLA PNEUMONIAE  Final      Susceptibility   Klebsiella pneumoniae - MIC*    AMPICILLIN >=32 RESISTANT Resistant     CEFEPIME >=32 RESISTANT Resistant     CEFTAZIDIME 32 RESISTANT Resistant     CEFTRIAXONE >=64 RESISTANT Resistant     CIPROFLOXACIN >=4 RESISTANT Resistant     GENTAMICIN >=16 RESISTANT Resistant     IMIPENEM 1 SENSITIVE Sensitive     TRIMETH/SULFA 160 RESISTANT Resistant     AMPICILLIN/SULBACTAM >=32 RESISTANT Resistant     PIP/TAZO 32 INTERMEDIATE Intermediate     * KLEBSIELLA PNEUMONIAE  Blood Culture ID Panel (Reflexed)     Status: Abnormal   Collection Time:  11/22/22  2:21 PM  Result Value Ref Range Status   Enterococcus faecalis NOT DETECTED NOT DETECTED Final   Enterococcus Faecium NOT DETECTED NOT DETECTED Final   Listeria monocytogenes NOT DETECTED NOT DETECTED Final   Staphylococcus species NOT DETECTED NOT DETECTED Final   Staphylococcus aureus (BCID) NOT DETECTED NOT DETECTED Final   Staphylococcus epidermidis NOT DETECTED NOT DETECTED Final   Staphylococcus lugdunensis NOT DETECTED NOT DETECTED Final   Streptococcus species NOT DETECTED NOT DETECTED Final   Streptococcus agalactiae NOT DETECTED NOT DETECTED Final   Streptococcus pneumoniae NOT DETECTED NOT DETECTED Final   Streptococcus pyogenes NOT DETECTED NOT DETECTED Final   A.calcoaceticus-baumannii NOT DETECTED NOT DETECTED Final   Bacteroides fragilis NOT DETECTED NOT DETECTED Final   Enterobacterales DETECTED (A) NOT DETECTED Final    Comment: Enterobacterales represent a large order of gram negative bacteria, not a single organism. CRITICAL RESULT CALLED TO, READ BACK BY AND VERIFIED WITH: PHARMD M. MITCHELL 11/23/22 @ 0559 BY AB    Enterobacter cloacae complex NOT DETECTED NOT DETECTED Final   Escherichia coli NOT DETECTED NOT DETECTED Final   Klebsiella aerogenes NOT DETECTED NOT DETECTED Final   Klebsiella oxytoca NOT DETECTED NOT DETECTED Final   Klebsiella pneumoniae DETECTED (A) NOT DETECTED Final    Comment: CRITICAL RESULT CALLED TO, READ BACK BY AND VERIFIED WITH: PHARMD M. MITCHELL 11/23/22 @ 0559 BY AB    Proteus species NOT DETECTED NOT DETECTED Final   Salmonella species NOT DETECTED NOT DETECTED Final   Serratia marcescens NOT DETECTED NOT DETECTED Final   Haemophilus influenzae NOT DETECTED NOT DETECTED Final   Neisseria meningitidis NOT DETECTED NOT DETECTED Final   Pseudomonas aeruginosa NOT DETECTED NOT DETECTED Final   Stenotrophomonas maltophilia NOT DETECTED NOT DETECTED Final   Candida albicans NOT DETECTED NOT DETECTED Final   Candida auris NOT  DETECTED NOT DETECTED Final   Candida glabrata NOT DETECTED NOT DETECTED Final   Candida krusei NOT DETECTED NOT DETECTED Final   Candida parapsilosis NOT DETECTED NOT DETECTED Final   Candida tropicalis NOT DETECTED NOT DETECTED Final   Cryptococcus neoformans/gattii NOT DETECTED NOT DETECTED Final   CTX-M ESBL DETECTED (A) NOT DETECTED Final    Comment: CRITICAL RESULT CALLED TO, READ BACK BY AND VERIFIED WITH: PHARMD M. MITCHELL 11/23/22 @ 0559 BY AB (NOTE) Extended spectrum beta-lactamase detected. Recommend a carbapenem as initial therapy.      Carbapenem resistance IMP NOT DETECTED NOT DETECTED Final   Carbapenem resistance KPC NOT DETECTED NOT DETECTED Final   Carbapenem resistance NDM NOT DETECTED NOT DETECTED Final   Carbapenem resist OXA 48 LIKE NOT DETECTED NOT DETECTED Final   Carbapenem resistance VIM NOT DETECTED NOT DETECTED Final    Comment: Performed at [facility], [address], [name], [place] [address]  Culture, blood (Routine x 2)     Status: Abnormal   Collection Time: 11/22/22  2:26 PM   Specimen: BLOOD  Result Value Ref Range Status   Specimen Description BLOOD RIGHT ANTECUBITAL  Final   Special Requests NONE  Final   Culture  Setup Time   Final    GRAM NEGATIVE RODS AEROBIC BOTTLE ONLY CRITICAL VALUE NOTED.  VALUE IS CONSISTENT WITH PREVIOUSLY REPORTED AND CALLED VALUE.    Culture (A)  Final    KLEBSIELLA PNEUMONIAE SUSCEPTIBILITIES PERFORMED ON PREVIOUS CULTURE WITHIN THE LAST 5 DAYS. Performed at [facility], [address], [place], [place] [address]    Report Status 11/25/2022 FINAL  Final  SARS Coronavirus 2 by RT PCR (hospital order, performed in [facility] hospital lab) *cepheid single result test* Anterior Nasal Swab     Status: Abnormal   Collection Time: 11/22/22  3:33 PM   Specimen: Anterior Nasal Swab  Result Value Ref Range Status   SARS Coronavirus 2 by RT PCR POSITIVE (A) NEGATIVE Final    Comment: Performed at [facility], [address] [place], [place] [address]  Urine Culture     Status: Abnormal   Collection Time: 11/22/22  4:21 PM   Specimen: Urine, Random  Result Value Ref Range Status   Specimen Description URINE, RANDOM  Final   Special Requests   Final    NONE Reflexed from [id number] Performed at [facility], [address], [place], [place] [address]    Culture (A)  Final    >=100,000 COLONIES/mL KLEBSIELLA PNEUMONIAE 80,000 COLONIES/mL KLEBSIELLA PNEUMONIAE Confirmed Extended Spectrum Beta-Lactamase Producer (ESBL).  In bloodstream infections from ESBL organisms, carbapenems are preferred over piperacillin/tazobactam. They are shown to have a lower risk of mortality.    Report Status 11/25/2022 FINAL  Final   Organism ID, Bacteria KLEBSIELLA PNEUMONIAE (A)  Final   Organism ID, Bacteria KLEBSIELLA PNEUMONIAE (A)  Final      Susceptibility   Klebsiella pneumoniae - MIC*    AMPICILLIN >=32 RESISTANT Resistant     CEFAZOLIN >=64 RESISTANT Resistant     CEFEPIME >=32 RESISTANT Resistant     CEFTRIAXONE >=64 RESISTANT Resistant     CIPROFLOXACIN >=4 RESISTANT Resistant     GENTAMICIN >=16 RESISTANT Resistant     IMIPENEM <=0.25 SENSITIVE Sensitive     NITROFURANTOIN 128 RESISTANT Resistant     TRIMETH/SULFA 80 RESISTANT Resistant     AMPICILLIN/SULBACTAM >=32 RESISTANT Resistant     PIP/TAZO 16 SENSITIVE Sensitive    Klebsiella pneumoniae - MIC*    AMPICILLIN >=32 RESISTANT Resistant     CEFAZOLIN >=64 RESISTANT Resistant     CEFEPIME >=32 RESISTANT Resistant     CEFTRIAXONE >=64 RESISTANT Resistant     CIPROFLOXACIN >=4 RESISTANT Resistant     GENTAMICIN <=1 SENSITIVE Sensitive     IMIPENEM 1 SENSITIVE Sensitive     NITROFURANTOIN 64 INTERMEDIATE Intermediate     TRIMETH/SULFA <=20 SENSITIVE Sensitive     AMPICILLIN/SULBACTAM >=32 RESISTANT Resistant     PIP/TAZO <=4 SENSITIVE Sensitive     * >=100,000 COLONIES/mL KLEBSIELLA PNEUMONIAE    80,000 COLONIES/mL  KLEBSIELLA PNEUMONIAE   *Note: Due to a large number of results and/or encounters for the requested time period, some results have not been displayed. A complete set of results can be found in Results Review.    Labs: CBC: Recent Labs  Lab 11/22/22 1520 11/23/22 0306 11/24/22 0803  WBC 16.3* 20.2* 13.8*  NEUTROABS 14.8*  --   --   HGB 9.2* 9.2* 10.3*  HCT 29.3* 28.5* 31.5*  MCV 90.4 91.1 89.7  PLT 218 239 222   Basic Metabolic Panel: Recent Labs  Lab 11/22/22 1520 11/22/22 1946 11/23/22 0306 11/24/22 0803  NA 132*  --  132* 133*  K 5.4*  --  4.6 3.7  CL 93*  --  95* 98  CO2 27  --  27 24  GLUCOSE 166*  --  141* 72  BUN 32*  --  37* 18  CREATININE 2.51*  --  2.79* 1.55*  CALCIUM 7.5*  --  7.6* 7.4*  PHOS  --  1.6*  --   --  Liver Function Tests: Recent Labs  Lab 11/22/22 1520 11/23/22 0306  AST 62* 58*  ALT 19 22  ALKPHOS 394* 370*  BILITOT 1.3* 1.0  PROT 5.0* 4.7*  ALBUMIN <1.5* <1.5*   CBG: Recent Labs  Lab 11/26/22 1136 11/26/22 1619 11/26/22 2036 11/27/22 0613 11/27/22 1201  GLUCAP 148* 207* 234* 261* 287*    Discharge time spent:  35 minutes  Signed: Jacquelin Hawking, MD [facility] 11/27/2022

## 2022-11-27 NOTE — Plan of Care (Signed)
  Problem: Pain Managment: Goal: General experience of comfort will improve Outcome: Progressing   Problem: Safety: Goal: Ability to remain free from injury will improve Outcome: Progressing   Problem: Skin Integrity: Goal: Risk for impaired skin integrity will decrease Outcome: Progressing   

## 2022-11-30 ENCOUNTER — Telehealth: Payer: Self-pay | Admitting: Neurology

## 2022-11-30 ENCOUNTER — Ambulatory Visit: Payer: Medicare Other | Admitting: Neurology

## 2022-11-30 NOTE — Telephone Encounter (Signed)
Pt's husband called to inform the provider that the pt is in [facility] and not doing to well.

## 2022-11-30 NOTE — Telephone Encounter (Signed)
Confirmed with phone staff this is an FYI and no call back was requested.

## 2022-12-07 ENCOUNTER — Ambulatory Visit: Payer: Medicare Other | Admitting: Internal Medicine

## 2022-12-08 ENCOUNTER — Ambulatory Visit: Payer: Self-pay

## 2022-12-08 NOTE — Patient Outreach (Signed)
Care Coordination   Case Closure  Visit Note   12/08/2022 Name: ALANNIE PORTELA MRN: [id number] DOB: 1940-07-06  Deloria Lair Aimone is a 82 y.o. year old female who sees Drue Novel, Nolon Rod, MD for primary care.   RNCM received notification patient discharged to Hospice facility. Case closed.    Goals Addressed             This Visit's Progress    COMPLETED: continue to improve post hospitalization       Interventions Today    Flowsheet Row Most Recent Value  General Interventions   General Interventions Discussed/Reviewed General Interventions Reviewed  [facility], patient discharged to [facility]. Case closed]            SDOH assessments and interventions completed:  No  Care Coordination Interventions:  No, not indicated   Follow up plan: No further intervention required.   Encounter Outcome:  Patient Visit Completed   Kathyrn Sheriff, RN, MSN, BSN, CCM Care Management Coordinator [phone]

## 2023-01-01 DEATH — deceased

## 2023-01-05 ENCOUNTER — Ambulatory Visit: Payer: Medicare Other | Admitting: Internal Medicine

## 2023-01-12 ENCOUNTER — Ambulatory Visit (HOSPITAL_BASED_OUTPATIENT_CLINIC_OR_DEPARTMENT_OTHER): Payer: Medicare Other | Admitting: Family
